# Patient Record
Sex: Female | Born: 1975
Health system: Southern US, Community
[De-identification: ages and names within clinical notes are randomized; demographics above are authoritative.]

## PROBLEM LIST (undated history)

## (undated) DIAGNOSIS — R519 Headache, unspecified: Secondary | ICD-10-CM

## (undated) DIAGNOSIS — E119 Type 2 diabetes mellitus without complications: Secondary | ICD-10-CM

## (undated) DIAGNOSIS — Z9221 Personal history of antineoplastic chemotherapy: Secondary | ICD-10-CM

## (undated) DIAGNOSIS — J189 Pneumonia, unspecified organism: Secondary | ICD-10-CM

## (undated) DIAGNOSIS — I890 Lymphedema, not elsewhere classified: Secondary | ICD-10-CM

## (undated) DIAGNOSIS — F419 Anxiety disorder, unspecified: Secondary | ICD-10-CM

## (undated) DIAGNOSIS — Z923 Personal history of irradiation: Secondary | ICD-10-CM

## (undated) DIAGNOSIS — I10 Essential (primary) hypertension: Secondary | ICD-10-CM

## (undated) DIAGNOSIS — A048 Other specified bacterial intestinal infections: Secondary | ICD-10-CM

## (undated) DIAGNOSIS — C801 Malignant (primary) neoplasm, unspecified: Secondary | ICD-10-CM

## (undated) DIAGNOSIS — J302 Other seasonal allergic rhinitis: Secondary | ICD-10-CM

## (undated) DIAGNOSIS — Z853 Personal history of malignant neoplasm of breast: Secondary | ICD-10-CM

## (undated) DIAGNOSIS — M1990 Unspecified osteoarthritis, unspecified site: Secondary | ICD-10-CM

## (undated) DIAGNOSIS — F329 Major depressive disorder, single episode, unspecified: Secondary | ICD-10-CM

## (undated) DIAGNOSIS — R51 Headache: Secondary | ICD-10-CM

## (undated) DIAGNOSIS — F32A Depression, unspecified: Secondary | ICD-10-CM

## (undated) HISTORY — DX: Malignant (primary) neoplasm, unspecified: C80.1

## (undated) HISTORY — DX: Pneumonia, unspecified organism: J18.9

## (undated) HISTORY — DX: Unspecified osteoarthritis, unspecified site: M19.90

## (undated) HISTORY — DX: Essential (primary) hypertension: I10

## (undated) HISTORY — DX: Other specified bacterial intestinal infections: A04.8

## (undated) HISTORY — DX: Other seasonal allergic rhinitis: J30.2

## (undated) HISTORY — PX: NASAL SEPTUM SURGERY: SHX37

## (undated) HISTORY — DX: Type 2 diabetes mellitus without complications: E11.9

---

## 1997-09-10 ENCOUNTER — Emergency Department (HOSPITAL_COMMUNITY): Admission: EM | Admit: 1997-09-10 | Discharge: 1997-09-10 | Payer: Self-pay | Admitting: Emergency Medicine

## 2001-03-07 ENCOUNTER — Encounter: Admission: RE | Admit: 2001-03-07 | Discharge: 2001-03-07 | Payer: Self-pay | Admitting: Sports Medicine

## 2001-04-04 ENCOUNTER — Encounter: Admission: RE | Admit: 2001-04-04 | Discharge: 2001-04-04 | Payer: Self-pay | Admitting: Sports Medicine

## 2001-04-25 ENCOUNTER — Encounter: Payer: Self-pay | Admitting: Sports Medicine

## 2001-04-25 ENCOUNTER — Ambulatory Visit (HOSPITAL_COMMUNITY): Admission: RE | Admit: 2001-04-25 | Discharge: 2001-04-25 | Payer: Self-pay | Admitting: Obstetrics & Gynecology

## 2001-05-02 ENCOUNTER — Encounter: Admission: RE | Admit: 2001-05-02 | Discharge: 2001-05-02 | Payer: Self-pay | Admitting: Family Medicine

## 2001-05-22 ENCOUNTER — Encounter: Admission: RE | Admit: 2001-05-22 | Discharge: 2001-05-22 | Payer: Self-pay | Admitting: Family Medicine

## 2001-06-23 ENCOUNTER — Encounter: Admission: RE | Admit: 2001-06-23 | Discharge: 2001-06-23 | Payer: Self-pay | Admitting: Family Medicine

## 2001-06-26 ENCOUNTER — Encounter: Admission: RE | Admit: 2001-06-26 | Discharge: 2001-06-26 | Payer: Self-pay | Admitting: Family Medicine

## 2001-07-06 ENCOUNTER — Encounter (HOSPITAL_COMMUNITY): Admission: AD | Admit: 2001-07-06 | Discharge: 2001-07-16 | Payer: Self-pay | Admitting: *Deleted

## 2001-07-06 ENCOUNTER — Encounter: Admission: RE | Admit: 2001-07-06 | Discharge: 2001-07-06 | Payer: Self-pay | Admitting: Family Medicine

## 2001-07-12 ENCOUNTER — Encounter (INDEPENDENT_AMBULATORY_CARE_PROVIDER_SITE_OTHER): Payer: Self-pay | Admitting: Specialist

## 2001-07-13 ENCOUNTER — Encounter: Payer: Self-pay | Admitting: *Deleted

## 2001-07-13 ENCOUNTER — Inpatient Hospital Stay (HOSPITAL_COMMUNITY): Admission: AD | Admit: 2001-07-13 | Discharge: 2001-07-18 | Payer: Self-pay | Admitting: *Deleted

## 2001-07-13 DIAGNOSIS — O1490 Unspecified pre-eclampsia, unspecified trimester: Secondary | ICD-10-CM

## 2001-07-21 ENCOUNTER — Encounter: Admission: RE | Admit: 2001-07-21 | Discharge: 2001-07-21 | Payer: Self-pay | Admitting: Family Medicine

## 2001-07-25 ENCOUNTER — Encounter: Admission: RE | Admit: 2001-07-25 | Discharge: 2001-07-25 | Payer: Self-pay | Admitting: Sports Medicine

## 2001-08-11 ENCOUNTER — Encounter: Admission: RE | Admit: 2001-08-11 | Discharge: 2001-08-11 | Payer: Self-pay | Admitting: Family Medicine

## 2002-04-01 ENCOUNTER — Encounter: Payer: Self-pay | Admitting: Emergency Medicine

## 2002-04-01 ENCOUNTER — Emergency Department (HOSPITAL_COMMUNITY): Admission: EM | Admit: 2002-04-01 | Discharge: 2002-04-01 | Payer: Self-pay | Admitting: Emergency Medicine

## 2002-08-31 ENCOUNTER — Other Ambulatory Visit: Admission: RE | Admit: 2002-08-31 | Discharge: 2002-08-31 | Payer: Self-pay | Admitting: Family Medicine

## 2002-08-31 ENCOUNTER — Encounter: Admission: RE | Admit: 2002-08-31 | Discharge: 2002-08-31 | Payer: Self-pay | Admitting: Family Medicine

## 2002-10-05 ENCOUNTER — Encounter: Payer: Self-pay | Admitting: Emergency Medicine

## 2002-10-05 ENCOUNTER — Emergency Department (HOSPITAL_COMMUNITY): Admission: EM | Admit: 2002-10-05 | Discharge: 2002-10-05 | Payer: Self-pay | Admitting: Emergency Medicine

## 2002-11-05 ENCOUNTER — Encounter: Admission: RE | Admit: 2002-11-05 | Discharge: 2002-11-05 | Payer: Self-pay | Admitting: Sports Medicine

## 2002-11-12 ENCOUNTER — Encounter: Admission: RE | Admit: 2002-11-12 | Discharge: 2002-11-12 | Payer: Self-pay | Admitting: Sports Medicine

## 2002-11-15 ENCOUNTER — Encounter: Admission: RE | Admit: 2002-11-15 | Discharge: 2002-11-15 | Payer: Self-pay | Admitting: Family Medicine

## 2002-11-19 ENCOUNTER — Encounter: Admission: RE | Admit: 2002-11-19 | Discharge: 2002-11-19 | Payer: Self-pay | Admitting: Family Medicine

## 2002-11-28 ENCOUNTER — Encounter: Admission: RE | Admit: 2002-11-28 | Discharge: 2002-11-28 | Payer: Self-pay | Admitting: Family Medicine

## 2003-02-13 ENCOUNTER — Encounter: Admission: RE | Admit: 2003-02-13 | Discharge: 2003-02-13 | Payer: Self-pay | Admitting: Family Medicine

## 2003-02-26 ENCOUNTER — Encounter: Admission: RE | Admit: 2003-02-26 | Discharge: 2003-02-26 | Payer: Self-pay | Admitting: Family Medicine

## 2003-08-14 ENCOUNTER — Encounter: Admission: RE | Admit: 2003-08-14 | Discharge: 2003-08-14 | Payer: Self-pay | Admitting: Family Medicine

## 2003-08-21 ENCOUNTER — Encounter: Admission: RE | Admit: 2003-08-21 | Discharge: 2003-08-21 | Payer: Self-pay | Admitting: Family Medicine

## 2003-08-21 ENCOUNTER — Other Ambulatory Visit: Admission: RE | Admit: 2003-08-21 | Discharge: 2003-08-21 | Payer: Self-pay | Admitting: Family Medicine

## 2003-08-28 ENCOUNTER — Ambulatory Visit (HOSPITAL_COMMUNITY): Admission: RE | Admit: 2003-08-28 | Discharge: 2003-08-28 | Payer: Self-pay | Admitting: Family Medicine

## 2003-09-03 ENCOUNTER — Encounter: Admission: RE | Admit: 2003-09-03 | Discharge: 2003-09-03 | Payer: Self-pay | Admitting: Family Medicine

## 2003-09-24 ENCOUNTER — Encounter: Admission: RE | Admit: 2003-09-24 | Discharge: 2003-09-24 | Payer: Self-pay | Admitting: Family Medicine

## 2003-10-03 ENCOUNTER — Inpatient Hospital Stay (HOSPITAL_COMMUNITY): Admission: AD | Admit: 2003-10-03 | Discharge: 2003-10-03 | Payer: Self-pay | Admitting: Obstetrics & Gynecology

## 2003-10-29 ENCOUNTER — Encounter: Admission: RE | Admit: 2003-10-29 | Discharge: 2003-10-29 | Payer: Self-pay | Admitting: Family Medicine

## 2003-10-31 ENCOUNTER — Ambulatory Visit (HOSPITAL_COMMUNITY): Admission: RE | Admit: 2003-10-31 | Discharge: 2003-10-31 | Payer: Self-pay | Admitting: Family Medicine

## 2003-11-28 ENCOUNTER — Encounter: Admission: RE | Admit: 2003-11-28 | Discharge: 2003-11-28 | Payer: Self-pay | Admitting: Sports Medicine

## 2004-01-01 ENCOUNTER — Encounter: Admission: RE | Admit: 2004-01-01 | Discharge: 2004-01-01 | Payer: Self-pay | Admitting: Family Medicine

## 2004-03-18 DIAGNOSIS — O24419 Gestational diabetes mellitus in pregnancy, unspecified control: Secondary | ICD-10-CM

## 2004-06-07 HISTORY — PX: SUPRA-UMBILICAL HERNIA: SHX6105

## 2004-08-05 ENCOUNTER — Encounter (INDEPENDENT_AMBULATORY_CARE_PROVIDER_SITE_OTHER): Payer: Self-pay | Admitting: *Deleted

## 2004-08-31 ENCOUNTER — Ambulatory Visit: Payer: Self-pay | Admitting: Sports Medicine

## 2004-10-30 ENCOUNTER — Emergency Department (HOSPITAL_COMMUNITY): Admission: EM | Admit: 2004-10-30 | Discharge: 2004-10-30 | Payer: Self-pay | Admitting: Emergency Medicine

## 2004-12-25 ENCOUNTER — Ambulatory Visit: Payer: Self-pay | Admitting: Family Medicine

## 2005-03-06 ENCOUNTER — Emergency Department (HOSPITAL_COMMUNITY): Admission: EM | Admit: 2005-03-06 | Discharge: 2005-03-06 | Payer: Self-pay | Admitting: Emergency Medicine

## 2005-09-21 ENCOUNTER — Ambulatory Visit: Payer: Self-pay | Admitting: Sports Medicine

## 2005-09-24 ENCOUNTER — Emergency Department (HOSPITAL_COMMUNITY): Admission: EM | Admit: 2005-09-24 | Discharge: 2005-09-24 | Payer: Self-pay | Admitting: Family Medicine

## 2005-10-01 ENCOUNTER — Ambulatory Visit (HOSPITAL_COMMUNITY): Admission: RE | Admit: 2005-10-01 | Discharge: 2005-10-01 | Payer: Self-pay | Admitting: *Deleted

## 2005-11-21 ENCOUNTER — Emergency Department (HOSPITAL_COMMUNITY): Admission: EM | Admit: 2005-11-21 | Discharge: 2005-11-21 | Payer: Self-pay | Admitting: Emergency Medicine

## 2006-01-30 ENCOUNTER — Emergency Department (HOSPITAL_COMMUNITY): Admission: EM | Admit: 2006-01-30 | Discharge: 2006-01-30 | Payer: Self-pay | Admitting: Emergency Medicine

## 2006-03-02 ENCOUNTER — Emergency Department (HOSPITAL_COMMUNITY): Admission: EM | Admit: 2006-03-02 | Discharge: 2006-03-02 | Payer: Self-pay | Admitting: Family Medicine

## 2006-08-04 DIAGNOSIS — F329 Major depressive disorder, single episode, unspecified: Secondary | ICD-10-CM

## 2006-08-04 DIAGNOSIS — E669 Obesity, unspecified: Secondary | ICD-10-CM

## 2006-08-04 DIAGNOSIS — J309 Allergic rhinitis, unspecified: Secondary | ICD-10-CM | POA: Insufficient documentation

## 2006-08-05 ENCOUNTER — Encounter (INDEPENDENT_AMBULATORY_CARE_PROVIDER_SITE_OTHER): Payer: Self-pay | Admitting: *Deleted

## 2007-06-12 ENCOUNTER — Encounter (INDEPENDENT_AMBULATORY_CARE_PROVIDER_SITE_OTHER): Payer: Self-pay | Admitting: *Deleted

## 2007-06-12 ENCOUNTER — Ambulatory Visit: Payer: Self-pay | Admitting: Family Medicine

## 2007-06-12 LAB — CONVERTED CEMR LAB
Beta hcg, urine, semiquantitative: NEGATIVE
GC Probe Amp, Genital: NEGATIVE
HCT: 42 % (ref 36.0–46.0)
Pap Smear: NORMAL
Platelets: 265 10*3/uL (ref 150–400)
RDW: 14 % (ref 11.5–15.5)
TSH: 0.922 microintl units/mL (ref 0.350–5.50)
Whiff Test: POSITIVE

## 2007-06-15 ENCOUNTER — Encounter (INDEPENDENT_AMBULATORY_CARE_PROVIDER_SITE_OTHER): Payer: Self-pay | Admitting: *Deleted

## 2007-09-13 ENCOUNTER — Encounter (INDEPENDENT_AMBULATORY_CARE_PROVIDER_SITE_OTHER): Payer: Self-pay | Admitting: *Deleted

## 2008-03-20 ENCOUNTER — Encounter: Payer: Self-pay | Admitting: Family Medicine

## 2008-03-20 ENCOUNTER — Ambulatory Visit: Payer: Self-pay | Admitting: Family Medicine

## 2008-03-20 DIAGNOSIS — O99810 Abnormal glucose complicating pregnancy: Secondary | ICD-10-CM | POA: Insufficient documentation

## 2008-03-20 LAB — CONVERTED CEMR LAB
Basophils Absolute: 0 10*3/uL (ref 0.0–0.1)
Basophils Relative: 0 % (ref 0–1)
Hemoglobin: 13.2 g/dL (ref 12.0–15.0)
Hepatitis B Surface Ag: NEGATIVE
Lymphocytes Relative: 30 % (ref 12–46)
MCHC: 32.8 g/dL (ref 30.0–36.0)
Monocytes Absolute: 0.4 10*3/uL (ref 0.1–1.0)
Neutro Abs: 5.1 10*3/uL (ref 1.7–7.7)
Neutrophils Relative %: 58 % (ref 43–77)
RDW: 13.5 % (ref 11.5–15.5)
Rubella: 11.3 intl units/mL — ABNORMAL HIGH

## 2008-04-10 ENCOUNTER — Ambulatory Visit: Payer: Self-pay | Admitting: Family Medicine

## 2008-04-10 ENCOUNTER — Encounter: Payer: Self-pay | Admitting: Family Medicine

## 2008-04-10 LAB — CONVERTED CEMR LAB
Glucose, Urine, Semiquant: NEGATIVE
Protein, U semiquant: NEGATIVE

## 2008-04-12 ENCOUNTER — Telehealth: Payer: Self-pay | Admitting: *Deleted

## 2008-04-18 ENCOUNTER — Telehealth: Payer: Self-pay | Admitting: *Deleted

## 2008-04-30 ENCOUNTER — Telehealth: Payer: Self-pay | Admitting: *Deleted

## 2008-05-07 ENCOUNTER — Ambulatory Visit: Payer: Self-pay | Admitting: Family Medicine

## 2008-05-20 ENCOUNTER — Ambulatory Visit: Payer: Self-pay | Admitting: Family Medicine

## 2008-05-20 ENCOUNTER — Encounter: Payer: Self-pay | Admitting: Family Medicine

## 2008-05-20 DIAGNOSIS — IMO0002 Reserved for concepts with insufficient information to code with codable children: Secondary | ICD-10-CM | POA: Insufficient documentation

## 2008-06-20 ENCOUNTER — Ambulatory Visit: Payer: Self-pay | Admitting: Family Medicine

## 2008-06-20 LAB — CONVERTED CEMR LAB: Whiff Test: POSITIVE

## 2008-06-24 ENCOUNTER — Telehealth: Payer: Self-pay | Admitting: Family Medicine

## 2008-06-24 ENCOUNTER — Ambulatory Visit (HOSPITAL_COMMUNITY): Admission: RE | Admit: 2008-06-24 | Discharge: 2008-06-24 | Payer: Self-pay | Admitting: Family Medicine

## 2008-06-24 ENCOUNTER — Encounter: Payer: Self-pay | Admitting: Family Medicine

## 2008-07-04 ENCOUNTER — Encounter: Payer: Self-pay | Admitting: *Deleted

## 2008-07-11 ENCOUNTER — Encounter: Payer: Self-pay | Admitting: Family Medicine

## 2008-07-23 ENCOUNTER — Ambulatory Visit: Payer: Self-pay | Admitting: Family Medicine

## 2008-08-19 ENCOUNTER — Ambulatory Visit: Payer: Self-pay | Admitting: Family Medicine

## 2008-08-19 DIAGNOSIS — O34219 Maternal care for unspecified type scar from previous cesarean delivery: Secondary | ICD-10-CM | POA: Insufficient documentation

## 2008-08-19 LAB — CONVERTED CEMR LAB: Glucose, Urine, Semiquant: NEGATIVE

## 2008-08-22 ENCOUNTER — Encounter: Admission: RE | Admit: 2008-08-22 | Discharge: 2008-08-22 | Payer: Self-pay | Admitting: Family Medicine

## 2008-08-22 ENCOUNTER — Encounter: Payer: Self-pay | Admitting: Family Medicine

## 2008-08-27 ENCOUNTER — Ambulatory Visit: Payer: Self-pay | Admitting: Family Medicine

## 2008-08-27 ENCOUNTER — Encounter: Payer: Self-pay | Admitting: Family Medicine

## 2008-09-19 ENCOUNTER — Encounter: Payer: Self-pay | Admitting: Family Medicine

## 2008-09-19 ENCOUNTER — Encounter: Payer: Self-pay | Admitting: *Deleted

## 2008-09-19 ENCOUNTER — Ambulatory Visit: Payer: Self-pay | Admitting: Family Medicine

## 2008-09-19 LAB — CONVERTED CEMR LAB

## 2008-09-23 ENCOUNTER — Telehealth: Payer: Self-pay | Admitting: Family Medicine

## 2008-09-25 ENCOUNTER — Ambulatory Visit: Payer: Self-pay | Admitting: Family Medicine

## 2008-09-25 LAB — CONVERTED CEMR LAB
Bilirubin Urine: NEGATIVE
Glucose, Urine, Semiquant: 100
Specific Gravity, Urine: 1.02
WBC Urine, dipstick: NEGATIVE

## 2008-10-10 ENCOUNTER — Ambulatory Visit: Payer: Self-pay | Admitting: Family Medicine

## 2008-10-14 ENCOUNTER — Encounter: Payer: Self-pay | Admitting: *Deleted

## 2008-10-14 ENCOUNTER — Telehealth: Payer: Self-pay | Admitting: Family Medicine

## 2008-10-24 ENCOUNTER — Ambulatory Visit: Payer: Self-pay | Admitting: Family Medicine

## 2008-10-24 ENCOUNTER — Inpatient Hospital Stay (HOSPITAL_COMMUNITY): Admission: AD | Admit: 2008-10-24 | Discharge: 2008-10-24 | Payer: Self-pay | Admitting: Obstetrics & Gynecology

## 2008-10-25 ENCOUNTER — Ambulatory Visit: Payer: Self-pay | Admitting: Family Medicine

## 2008-10-28 ENCOUNTER — Ambulatory Visit: Payer: Self-pay | Admitting: Family Medicine

## 2008-10-28 ENCOUNTER — Telehealth: Payer: Self-pay | Admitting: *Deleted

## 2008-10-29 ENCOUNTER — Encounter: Payer: Self-pay | Admitting: Obstetrics & Gynecology

## 2008-10-29 LAB — CONVERTED CEMR LAB
Creatinine 24 HR UR: 1810 mg/24hr — ABNORMAL HIGH (ref 700–1800)
Creatinine, Urine: 114.9 mg/dL
Protein, Ur: 110 mg/24hr — ABNORMAL HIGH (ref 50–100)

## 2008-10-30 ENCOUNTER — Encounter: Payer: Self-pay | Admitting: Family Medicine

## 2008-10-30 ENCOUNTER — Ambulatory Visit: Payer: Self-pay | Admitting: Family Medicine

## 2008-10-31 ENCOUNTER — Encounter: Payer: Self-pay | Admitting: *Deleted

## 2008-11-02 ENCOUNTER — Encounter: Payer: Self-pay | Admitting: Family Medicine

## 2008-11-05 ENCOUNTER — Ambulatory Visit: Payer: Self-pay | Admitting: Family Medicine

## 2008-11-05 DIAGNOSIS — O3660X0 Maternal care for excessive fetal growth, unspecified trimester, not applicable or unspecified: Secondary | ICD-10-CM | POA: Insufficient documentation

## 2008-11-07 ENCOUNTER — Ambulatory Visit: Payer: Self-pay | Admitting: Obstetrics & Gynecology

## 2008-11-08 ENCOUNTER — Encounter: Payer: Self-pay | Admitting: Family Medicine

## 2008-11-12 ENCOUNTER — Ambulatory Visit: Payer: Self-pay | Admitting: Family Medicine

## 2008-11-14 ENCOUNTER — Ambulatory Visit: Payer: Self-pay | Admitting: Obstetrics & Gynecology

## 2008-11-15 ENCOUNTER — Encounter: Payer: Self-pay | Admitting: Family Medicine

## 2008-11-21 ENCOUNTER — Inpatient Hospital Stay (HOSPITAL_COMMUNITY): Admission: RE | Admit: 2008-11-21 | Discharge: 2008-11-24 | Payer: Self-pay | Admitting: Obstetrics & Gynecology

## 2008-11-21 ENCOUNTER — Ambulatory Visit: Payer: Self-pay | Admitting: Obstetrics & Gynecology

## 2008-11-21 HISTORY — PX: TUBAL LIGATION: SHX77

## 2008-11-27 ENCOUNTER — Ambulatory Visit: Payer: Self-pay | Admitting: Family Medicine

## 2009-01-07 ENCOUNTER — Encounter: Payer: Self-pay | Admitting: Family Medicine

## 2009-01-07 ENCOUNTER — Other Ambulatory Visit: Admission: RE | Admit: 2009-01-07 | Discharge: 2009-01-07 | Payer: Self-pay | Admitting: Family Medicine

## 2009-01-07 ENCOUNTER — Ambulatory Visit: Payer: Self-pay | Admitting: Family Medicine

## 2009-01-10 ENCOUNTER — Encounter: Payer: Self-pay | Admitting: Family Medicine

## 2009-06-07 DIAGNOSIS — Z923 Personal history of irradiation: Secondary | ICD-10-CM

## 2009-06-07 DIAGNOSIS — C801 Malignant (primary) neoplasm, unspecified: Secondary | ICD-10-CM

## 2009-06-07 HISTORY — DX: Malignant (primary) neoplasm, unspecified: C80.1

## 2009-06-07 HISTORY — DX: Personal history of irradiation: Z92.3

## 2009-06-07 HISTORY — PX: MASTECTOMY: SHX3

## 2009-07-08 ENCOUNTER — Ambulatory Visit: Payer: Self-pay | Admitting: Family Medicine

## 2009-08-26 ENCOUNTER — Encounter: Admission: RE | Admit: 2009-08-26 | Discharge: 2009-08-26 | Payer: Self-pay | Admitting: Family Medicine

## 2009-08-26 HISTORY — PX: BREAST BIOPSY: SHX20

## 2009-08-27 ENCOUNTER — Ambulatory Visit: Payer: Self-pay | Admitting: Oncology

## 2009-08-29 ENCOUNTER — Encounter: Payer: Self-pay | Admitting: Family Medicine

## 2009-08-29 ENCOUNTER — Encounter: Payer: Self-pay | Admitting: *Deleted

## 2009-08-29 ENCOUNTER — Telehealth: Payer: Self-pay | Admitting: Family Medicine

## 2009-09-02 ENCOUNTER — Encounter: Admission: RE | Admit: 2009-09-02 | Discharge: 2009-09-02 | Payer: Self-pay | Admitting: Family Medicine

## 2009-09-03 ENCOUNTER — Encounter: Payer: Self-pay | Admitting: Family Medicine

## 2009-09-03 LAB — CBC WITH DIFFERENTIAL/PLATELET
Eosinophils Absolute: 0.4 10*3/uL (ref 0.0–0.5)
MCV: 92.6 fL (ref 79.5–101.0)
MONO%: 7.1 % (ref 0.0–14.0)
NEUT#: 3.9 10*3/uL (ref 1.5–6.5)
RBC: 4.34 10*6/uL (ref 3.70–5.45)
RDW: 14.4 % (ref 11.2–14.5)
WBC: 8.1 10*3/uL (ref 3.9–10.3)
lymph#: 3.3 10*3/uL (ref 0.9–3.3)

## 2009-09-03 LAB — COMPREHENSIVE METABOLIC PANEL
ALT: 13 U/L (ref 0–35)
Albumin: 4.1 g/dL (ref 3.5–5.2)
Alkaline Phosphatase: 56 U/L (ref 39–117)
CO2: 22 mEq/L (ref 19–32)
Potassium: 3.9 mEq/L (ref 3.5–5.3)
Sodium: 139 mEq/L (ref 135–145)
Total Bilirubin: 0.2 mg/dL — ABNORMAL LOW (ref 0.3–1.2)
Total Protein: 7.5 g/dL (ref 6.0–8.3)

## 2009-09-03 LAB — CANCER ANTIGEN 27.29: CA 27.29: 29 U/mL (ref 0–39)

## 2009-09-09 ENCOUNTER — Encounter: Payer: Self-pay | Admitting: Oncology

## 2009-09-09 ENCOUNTER — Ambulatory Visit: Admission: RE | Admit: 2009-09-09 | Discharge: 2009-09-09 | Payer: Self-pay | Admitting: Oncology

## 2009-09-10 ENCOUNTER — Ambulatory Visit (HOSPITAL_COMMUNITY): Admission: RE | Admit: 2009-09-10 | Discharge: 2009-09-10 | Payer: Self-pay | Admitting: Oncology

## 2009-09-12 ENCOUNTER — Ambulatory Visit (HOSPITAL_BASED_OUTPATIENT_CLINIC_OR_DEPARTMENT_OTHER): Admission: RE | Admit: 2009-09-12 | Discharge: 2009-09-12 | Payer: Self-pay | Admitting: General Surgery

## 2009-09-12 HISTORY — PX: PORTACATH PLACEMENT: SHX2246

## 2009-09-15 ENCOUNTER — Encounter: Payer: Self-pay | Admitting: Family Medicine

## 2009-09-16 DIAGNOSIS — Z9221 Personal history of antineoplastic chemotherapy: Secondary | ICD-10-CM

## 2009-09-16 HISTORY — DX: Personal history of antineoplastic chemotherapy: Z92.21

## 2009-09-17 ENCOUNTER — Encounter: Payer: Self-pay | Admitting: Family Medicine

## 2009-09-25 ENCOUNTER — Encounter: Payer: Self-pay | Admitting: Family Medicine

## 2009-09-25 LAB — CBC WITH DIFFERENTIAL/PLATELET
Basophils Absolute: 0 10*3/uL (ref 0.0–0.1)
Eosinophils Absolute: 0.2 10*3/uL (ref 0.0–0.5)
HGB: 12.7 g/dL (ref 11.6–15.9)
MCV: 91.7 fL (ref 79.5–101.0)
MONO#: 0.1 10*3/uL (ref 0.1–0.9)
MONO%: 1.5 % (ref 0.0–14.0)
NEUT#: 3.4 10*3/uL (ref 1.5–6.5)
RBC: 4.05 10*6/uL (ref 3.70–5.45)
RDW: 14.2 % (ref 11.2–14.5)
WBC: 5.1 10*3/uL (ref 3.9–10.3)
lymph#: 1.3 10*3/uL (ref 0.9–3.3)

## 2009-10-01 ENCOUNTER — Ambulatory Visit: Payer: Self-pay | Admitting: Oncology

## 2009-10-02 ENCOUNTER — Encounter: Payer: Self-pay | Admitting: Family Medicine

## 2009-10-02 LAB — CBC WITH DIFFERENTIAL/PLATELET
BASO%: 0.3 % (ref 0.0–2.0)
Basophils Absolute: 0 10*3/uL (ref 0.0–0.1)
EOS%: 0.1 % (ref 0.0–7.0)
MCH: 30.2 pg (ref 25.1–34.0)
MCHC: 33.1 g/dL (ref 31.5–36.0)
MCV: 91.4 fL (ref 79.5–101.0)
MONO%: 6.1 % (ref 0.0–14.0)
RBC: 4.07 10*6/uL (ref 3.70–5.45)
RDW: 14.5 % (ref 11.2–14.5)
lymph#: 1.8 10*3/uL (ref 0.9–3.3)

## 2009-10-09 ENCOUNTER — Encounter: Payer: Self-pay | Admitting: Family Medicine

## 2009-10-09 LAB — CBC WITH DIFFERENTIAL/PLATELET
Eosinophils Absolute: 0 10*3/uL (ref 0.0–0.5)
HCT: 37 % (ref 34.8–46.6)
HGB: 12.5 g/dL (ref 11.6–15.9)
LYMPH%: 14.8 % (ref 14.0–49.7)
MONO#: 0.4 10*3/uL (ref 0.1–0.9)
NEUT#: 6.9 10*3/uL — ABNORMAL HIGH (ref 1.5–6.5)
NEUT%: 80.4 % — ABNORMAL HIGH (ref 38.4–76.8)
Platelets: 245 10*3/uL (ref 145–400)
WBC: 8.6 10*3/uL (ref 3.9–10.3)

## 2009-10-16 ENCOUNTER — Encounter: Payer: Self-pay | Admitting: Family Medicine

## 2009-10-16 LAB — CBC WITH DIFFERENTIAL/PLATELET
BASO%: 0.4 % (ref 0.0–2.0)
HCT: 36.8 % (ref 34.8–46.6)
LYMPH%: 19.8 % (ref 14.0–49.7)
MCHC: 33.4 g/dL (ref 31.5–36.0)
MONO#: 0.8 10*3/uL (ref 0.1–0.9)
NEUT%: 70.1 % (ref 38.4–76.8)
Platelets: 169 10*3/uL (ref 145–400)
WBC: 8.3 10*3/uL (ref 3.9–10.3)

## 2009-10-20 ENCOUNTER — Ambulatory Visit: Payer: Self-pay | Admitting: Oncology

## 2009-10-21 ENCOUNTER — Encounter: Payer: Self-pay | Admitting: Family Medicine

## 2009-10-21 ENCOUNTER — Ambulatory Visit: Payer: Self-pay | Admitting: Family Medicine

## 2009-10-21 LAB — CONVERTED CEMR LAB
HCT: 39.2 % (ref 36.0–46.0)
Lymphocytes Relative: 9 % — ABNORMAL LOW (ref 12–46)
Lymphs Abs: 0.9 10*3/uL (ref 0.7–4.0)
Monocytes Relative: 2 % — ABNORMAL LOW (ref 3–12)
Neutrophils Relative %: 87 % — ABNORMAL HIGH (ref 43–77)
Platelets: 219 10*3/uL (ref 150–400)
RBC: 4.38 M/uL (ref 3.87–5.11)
WBC: 9.4 10*3/uL (ref 4.0–10.5)

## 2009-10-22 ENCOUNTER — Encounter: Payer: Self-pay | Admitting: Family Medicine

## 2009-10-22 ENCOUNTER — Inpatient Hospital Stay (HOSPITAL_COMMUNITY): Admission: AD | Admit: 2009-10-22 | Discharge: 2009-10-23 | Payer: Self-pay | Admitting: Oncology

## 2009-10-22 ENCOUNTER — Ambulatory Visit (HOSPITAL_COMMUNITY): Admission: RE | Admit: 2009-10-22 | Discharge: 2009-10-22 | Payer: Self-pay | Admitting: Family Medicine

## 2009-10-22 ENCOUNTER — Encounter: Payer: Self-pay | Admitting: *Deleted

## 2009-10-22 LAB — CBC WITH DIFFERENTIAL/PLATELET
Basophils Absolute: 0 10*3/uL (ref 0.0–0.1)
EOS%: 1 % (ref 0.0–7.0)
HCT: 37.8 % (ref 34.8–46.6)
HGB: 13.1 g/dL (ref 11.6–15.9)
LYMPH%: 9 % — ABNORMAL LOW (ref 14.0–49.7)
MCH: 31.3 pg (ref 25.1–34.0)
MCV: 90.5 fL (ref 79.5–101.0)
MONO%: 1.1 % (ref 0.0–14.0)
NEUT%: 88.7 % — ABNORMAL HIGH (ref 38.4–76.8)
Platelets: 187 10*3/uL (ref 145–400)
RDW: 15.1 % — ABNORMAL HIGH (ref 11.2–14.5)

## 2009-10-22 LAB — COMPREHENSIVE METABOLIC PANEL
AST: 13 U/L (ref 0–37)
Alkaline Phosphatase: 92 U/L (ref 39–117)
BUN: 7 mg/dL (ref 6–23)
Creatinine, Ser: 0.58 mg/dL (ref 0.40–1.20)
Total Bilirubin: 1.3 mg/dL — ABNORMAL HIGH (ref 0.3–1.2)

## 2009-10-22 LAB — URINALYSIS, MICROSCOPIC - CHCC
Bilirubin (Urine): NEGATIVE
Blood: NEGATIVE
Glucose: NEGATIVE g/dL
Ketones: 40 mg/dL
pH: 7.5 (ref 4.6–8.0)

## 2009-10-23 LAB — URINE CULTURE

## 2009-10-24 ENCOUNTER — Emergency Department (HOSPITAL_COMMUNITY): Admission: EM | Admit: 2009-10-24 | Discharge: 2009-10-24 | Payer: Self-pay | Admitting: Emergency Medicine

## 2009-10-30 ENCOUNTER — Encounter: Payer: Self-pay | Admitting: Family Medicine

## 2009-10-30 LAB — CBC WITH DIFFERENTIAL/PLATELET
Basophils Absolute: 0 10*3/uL (ref 0.0–0.1)
EOS%: 0.2 % (ref 0.0–7.0)
HGB: 10.8 g/dL — ABNORMAL LOW (ref 11.6–15.9)
MCH: 30.3 pg (ref 25.1–34.0)
NEUT#: 6.1 10*3/uL (ref 1.5–6.5)
RBC: 3.56 10*6/uL — ABNORMAL LOW (ref 3.70–5.45)
RDW: 15.8 % — ABNORMAL HIGH (ref 11.2–14.5)
lymph#: 1.5 10*3/uL (ref 0.9–3.3)

## 2009-11-05 ENCOUNTER — Ambulatory Visit: Payer: Self-pay | Admitting: Oncology

## 2009-11-06 ENCOUNTER — Encounter: Payer: Self-pay | Admitting: Family Medicine

## 2009-11-06 LAB — COMPREHENSIVE METABOLIC PANEL
ALT: 21 U/L (ref 0–35)
AST: 18 U/L (ref 0–37)
Alkaline Phosphatase: 56 U/L (ref 39–117)
Creatinine, Ser: 0.54 mg/dL (ref 0.40–1.20)
Sodium: 140 mEq/L (ref 135–145)
Total Bilirubin: 0.5 mg/dL (ref 0.3–1.2)
Total Protein: 6.3 g/dL (ref 6.0–8.3)

## 2009-11-06 LAB — CBC WITH DIFFERENTIAL/PLATELET
BASO%: 1.5 % (ref 0.0–2.0)
EOS%: 1.7 % (ref 0.0–7.0)
Eosinophils Absolute: 0.1 10*3/uL (ref 0.0–0.5)
LYMPH%: 21.3 % (ref 14.0–49.7)
MCHC: 32.6 g/dL (ref 31.5–36.0)
MCV: 92.3 fL (ref 79.5–101.0)
MONO%: 15.5 % — ABNORMAL HIGH (ref 0.0–14.0)
NEUT#: 3.6 10*3/uL (ref 1.5–6.5)
RBC: 3.79 10*6/uL (ref 3.70–5.45)
RDW: 16.7 % — ABNORMAL HIGH (ref 11.2–14.5)
nRBC: 0 % (ref 0–0)

## 2009-11-13 ENCOUNTER — Encounter: Payer: Self-pay | Admitting: Family Medicine

## 2009-11-13 ENCOUNTER — Encounter: Payer: Self-pay | Admitting: Oncology

## 2009-11-13 ENCOUNTER — Ambulatory Visit (HOSPITAL_COMMUNITY): Admission: RE | Admit: 2009-11-13 | Discharge: 2009-11-13 | Payer: Self-pay | Admitting: Oncology

## 2009-11-13 LAB — CBC WITH DIFFERENTIAL/PLATELET
BASO%: 0.6 % (ref 0.0–2.0)
HCT: 32.3 % — ABNORMAL LOW (ref 34.8–46.6)
LYMPH%: 21 % (ref 14.0–49.7)
MCHC: 34.7 g/dL (ref 31.5–36.0)
MCV: 92.7 fL (ref 79.5–101.0)
MONO#: 0.1 10*3/uL (ref 0.1–0.9)
MONO%: 1.3 % (ref 0.0–14.0)
NEUT%: 74.9 % (ref 38.4–76.8)
Platelets: 191 10*3/uL (ref 145–400)
RBC: 3.48 10*6/uL — ABNORMAL LOW (ref 3.70–5.45)

## 2009-11-17 ENCOUNTER — Ambulatory Visit (HOSPITAL_COMMUNITY): Admission: RE | Admit: 2009-11-17 | Discharge: 2009-11-17 | Payer: Self-pay | Admitting: Oncology

## 2009-11-20 ENCOUNTER — Encounter: Payer: Self-pay | Admitting: Family Medicine

## 2009-11-20 LAB — CBC WITH DIFFERENTIAL/PLATELET
BASO%: 0.3 % (ref 0.0–2.0)
EOS%: 0.5 % (ref 0.0–7.0)
HCT: 37 % (ref 34.8–46.6)
LYMPH%: 20.1 % (ref 14.0–49.7)
MCH: 30.3 pg (ref 25.1–34.0)
MCHC: 32.7 g/dL (ref 31.5–36.0)
MONO#: 0.5 10*3/uL (ref 0.1–0.9)
NEUT%: 71 % (ref 38.4–76.8)
Platelets: 207 10*3/uL (ref 145–400)
RBC: 4 10*6/uL (ref 3.70–5.45)
WBC: 6.1 10*3/uL (ref 3.9–10.3)
lymph#: 1.2 10*3/uL (ref 0.9–3.3)

## 2009-11-20 LAB — URINALYSIS, MICROSCOPIC - CHCC
Glucose: NEGATIVE g/dL
Nitrite: NEGATIVE
Protein: NEGATIVE mg/dL
Specific Gravity, Urine: 1.01 (ref 1.003–1.035)

## 2009-11-27 ENCOUNTER — Encounter: Payer: Self-pay | Admitting: Family Medicine

## 2009-11-27 LAB — CBC WITH DIFFERENTIAL/PLATELET
BASO%: 0.3 % (ref 0.0–2.0)
Basophils Absolute: 0 10*3/uL (ref 0.0–0.1)
EOS%: 1.6 % (ref 0.0–7.0)
HCT: 32.6 % — ABNORMAL LOW (ref 34.8–46.6)
HGB: 10.7 g/dL — ABNORMAL LOW (ref 11.6–15.9)
LYMPH%: 20.2 % (ref 14.0–49.7)
MCH: 30.6 pg (ref 25.1–34.0)
MCHC: 32.8 g/dL (ref 31.5–36.0)
MCV: 93.1 fL (ref 79.5–101.0)
MONO%: 10.7 % (ref 0.0–14.0)
NEUT%: 67.2 % (ref 38.4–76.8)
Platelets: 271 10*3/uL (ref 145–400)

## 2009-11-27 LAB — COMPREHENSIVE METABOLIC PANEL
AST: 12 U/L (ref 0–37)
BUN: 10 mg/dL (ref 6–23)
Calcium: 8.9 mg/dL (ref 8.4–10.5)
Chloride: 110 mEq/L (ref 96–112)
Creatinine, Ser: 0.67 mg/dL (ref 0.40–1.20)
Total Bilirubin: 0.2 mg/dL — ABNORMAL LOW (ref 0.3–1.2)

## 2009-12-04 ENCOUNTER — Encounter: Payer: Self-pay | Admitting: Family Medicine

## 2009-12-04 LAB — CBC WITH DIFFERENTIAL/PLATELET
Basophils Absolute: 0 10*3/uL (ref 0.0–0.1)
Eosinophils Absolute: 0.1 10*3/uL (ref 0.0–0.5)
HGB: 11.1 g/dL — ABNORMAL LOW (ref 11.6–15.9)
NEUT#: 3.3 10*3/uL (ref 1.5–6.5)
RDW: 16.1 % — ABNORMAL HIGH (ref 11.2–14.5)
lymph#: 1.3 10*3/uL (ref 0.9–3.3)

## 2009-12-09 ENCOUNTER — Ambulatory Visit: Payer: Self-pay | Admitting: Oncology

## 2009-12-11 ENCOUNTER — Encounter: Payer: Self-pay | Admitting: Family Medicine

## 2009-12-11 LAB — CBC WITH DIFFERENTIAL/PLATELET
BASO%: 0.5 % (ref 0.0–2.0)
EOS%: 1.8 % (ref 0.0–7.0)
MCH: 31.3 pg (ref 25.1–34.0)
MCHC: 33.7 g/dL (ref 31.5–36.0)
MONO%: 7.3 % (ref 0.0–14.0)
RBC: 3.55 10*6/uL — ABNORMAL LOW (ref 3.70–5.45)
RDW: 16 % — ABNORMAL HIGH (ref 11.2–14.5)
lymph#: 1.3 10*3/uL (ref 0.9–3.3)

## 2009-12-18 ENCOUNTER — Encounter: Payer: Self-pay | Admitting: Family Medicine

## 2009-12-18 LAB — CBC WITH DIFFERENTIAL/PLATELET
Basophils Absolute: 0 10*3/uL (ref 0.0–0.1)
EOS%: 2.2 % (ref 0.0–7.0)
Eosinophils Absolute: 0.1 10*3/uL (ref 0.0–0.5)
HGB: 11.6 g/dL (ref 11.6–15.9)
NEUT#: 2.5 10*3/uL (ref 1.5–6.5)
RBC: 3.64 10*6/uL — ABNORMAL LOW (ref 3.70–5.45)
RDW: 15.6 % — ABNORMAL HIGH (ref 11.2–14.5)
lymph#: 1.1 10*3/uL (ref 0.9–3.3)
nRBC: 0 % (ref 0–0)

## 2009-12-25 ENCOUNTER — Encounter: Payer: Self-pay | Admitting: Family Medicine

## 2009-12-25 LAB — CBC WITH DIFFERENTIAL/PLATELET
BASO%: 0.5 % (ref 0.0–2.0)
EOS%: 3.2 % (ref 0.0–7.0)
MCH: 32.1 pg (ref 25.1–34.0)
MCHC: 34.1 g/dL (ref 31.5–36.0)
RBC: 3.55 10*6/uL — ABNORMAL LOW (ref 3.70–5.45)
RDW: 14.9 % — ABNORMAL HIGH (ref 11.2–14.5)
lymph#: 1.3 10*3/uL (ref 0.9–3.3)

## 2010-01-01 ENCOUNTER — Encounter: Payer: Self-pay | Admitting: Family Medicine

## 2010-01-01 LAB — CBC WITH DIFFERENTIAL/PLATELET
Basophils Absolute: 0 10*3/uL (ref 0.0–0.1)
EOS%: 3.5 % (ref 0.0–7.0)
Eosinophils Absolute: 0.2 10*3/uL (ref 0.0–0.5)
HGB: 11.4 g/dL — ABNORMAL LOW (ref 11.6–15.9)
NEUT#: 2.7 10*3/uL (ref 1.5–6.5)
RDW: 14.4 % (ref 11.2–14.5)
lymph#: 1.6 10*3/uL (ref 0.9–3.3)
nRBC: 0 % (ref 0–0)

## 2010-01-01 LAB — COMPREHENSIVE METABOLIC PANEL
ALT: 16 U/L (ref 0–35)
Albumin: 4.2 g/dL (ref 3.5–5.2)
Alkaline Phosphatase: 60 U/L (ref 39–117)
CO2: 19 mEq/L (ref 19–32)
Glucose, Bld: 86 mg/dL (ref 70–99)
Potassium: 4 mEq/L (ref 3.5–5.3)
Sodium: 139 mEq/L (ref 135–145)
Total Protein: 6.8 g/dL (ref 6.0–8.3)

## 2010-01-08 ENCOUNTER — Encounter: Payer: Self-pay | Admitting: Family Medicine

## 2010-01-08 ENCOUNTER — Ambulatory Visit: Payer: Self-pay | Admitting: Oncology

## 2010-01-08 LAB — CBC WITH DIFFERENTIAL/PLATELET
Eosinophils Absolute: 0.2 10*3/uL (ref 0.0–0.5)
MONO#: 0.3 10*3/uL (ref 0.1–0.9)
NEUT#: 3.1 10*3/uL (ref 1.5–6.5)
RBC: 3.54 10*6/uL — ABNORMAL LOW (ref 3.70–5.45)
RDW: 13.9 % (ref 11.2–14.5)
WBC: 5 10*3/uL (ref 3.9–10.3)

## 2010-01-15 ENCOUNTER — Encounter: Payer: Self-pay | Admitting: Family Medicine

## 2010-01-15 LAB — CBC WITH DIFFERENTIAL/PLATELET
Eosinophils Absolute: 0.2 10*3/uL (ref 0.0–0.5)
HCT: 34.1 % — ABNORMAL LOW (ref 34.8–46.6)
HGB: 11.5 g/dL — ABNORMAL LOW (ref 11.6–15.9)
LYMPH%: 16 % (ref 14.0–49.7)
MONO#: 0.3 10*3/uL (ref 0.1–0.9)
NEUT#: 5 10*3/uL (ref 1.5–6.5)
NEUT%: 77.1 % — ABNORMAL HIGH (ref 38.4–76.8)
Platelets: 293 10*3/uL (ref 145–400)
WBC: 6.4 10*3/uL (ref 3.9–10.3)

## 2010-01-22 ENCOUNTER — Encounter: Payer: Self-pay | Admitting: Family Medicine

## 2010-01-22 LAB — CBC WITH DIFFERENTIAL/PLATELET
BASO%: 0.2 % (ref 0.0–2.0)
HCT: 35 % (ref 34.8–46.6)
LYMPH%: 31.2 % (ref 14.0–49.7)
MCHC: 33.7 g/dL (ref 31.5–36.0)
MONO#: 0.4 10*3/uL (ref 0.1–0.9)
NEUT%: 57.6 % (ref 38.4–76.8)
Platelets: 309 10*3/uL (ref 145–400)
WBC: 4.3 10*3/uL (ref 3.9–10.3)

## 2010-01-29 ENCOUNTER — Encounter: Payer: Self-pay | Admitting: Family Medicine

## 2010-01-29 LAB — CBC WITH DIFFERENTIAL/PLATELET
BASO%: 0.2 % (ref 0.0–2.0)
EOS%: 2.2 % (ref 0.0–7.0)
HCT: 34.9 % (ref 34.8–46.6)
LYMPH%: 25.5 % (ref 14.0–49.7)
MCH: 31.6 pg (ref 25.1–34.0)
MCHC: 33.5 g/dL (ref 31.5–36.0)
MCV: 94.3 fL (ref 79.5–101.0)
MONO%: 8 % (ref 0.0–14.0)
NEUT%: 64.1 % (ref 38.4–76.8)
Platelets: 240 10*3/uL (ref 145–400)
lymph#: 1.4 10*3/uL (ref 0.9–3.3)

## 2010-02-05 ENCOUNTER — Encounter: Payer: Self-pay | Admitting: Family Medicine

## 2010-02-05 LAB — CBC WITH DIFFERENTIAL/PLATELET
BASO%: 0.2 % (ref 0.0–2.0)
HCT: 34.2 % — ABNORMAL LOW (ref 34.8–46.6)
MCHC: 33.3 g/dL (ref 31.5–36.0)
MONO#: 0.4 10*3/uL (ref 0.1–0.9)
NEUT%: 59.4 % (ref 38.4–76.8)
WBC: 5 10*3/uL (ref 3.9–10.3)
lymph#: 1.5 10*3/uL (ref 0.9–3.3)
nRBC: 0 % (ref 0–0)

## 2010-02-05 LAB — COMPREHENSIVE METABOLIC PANEL
ALT: 28 U/L (ref 0–35)
AST: 24 U/L (ref 0–37)
Albumin: 4 g/dL (ref 3.5–5.2)
BUN: 8 mg/dL (ref 6–23)
Calcium: 8.9 mg/dL (ref 8.4–10.5)
Chloride: 106 mEq/L (ref 96–112)
Potassium: 3.7 mEq/L (ref 3.5–5.3)
Sodium: 137 mEq/L (ref 135–145)
Total Protein: 6.9 g/dL (ref 6.0–8.3)

## 2010-02-06 ENCOUNTER — Ambulatory Visit: Payer: Self-pay | Admitting: Genetic Counselor

## 2010-02-10 ENCOUNTER — Encounter: Payer: Self-pay | Admitting: Oncology

## 2010-02-10 ENCOUNTER — Ambulatory Visit: Payer: Self-pay | Admitting: Oncology

## 2010-02-10 ENCOUNTER — Ambulatory Visit: Admission: RE | Admit: 2010-02-10 | Discharge: 2010-02-10 | Payer: Self-pay | Admitting: Oncology

## 2010-02-12 ENCOUNTER — Encounter: Payer: Self-pay | Admitting: Family Medicine

## 2010-02-12 LAB — CBC WITH DIFFERENTIAL/PLATELET
Basophils Absolute: 0 10*3/uL (ref 0.0–0.1)
Eosinophils Absolute: 0.1 10*3/uL (ref 0.0–0.5)
HGB: 11.3 g/dL — ABNORMAL LOW (ref 11.6–15.9)
LYMPH%: 38.6 % (ref 14.0–49.7)
MCV: 92.1 fL (ref 79.5–101.0)
MONO#: 0.3 10*3/uL (ref 0.1–0.9)
MONO%: 9.7 % (ref 0.0–14.0)
NEUT#: 1.2 10*3/uL — ABNORMAL LOW (ref 1.5–6.5)
Platelets: 251 10*3/uL (ref 145–400)
RBC: 3.67 10*6/uL — ABNORMAL LOW (ref 3.70–5.45)
WBC: 2.7 10*3/uL — ABNORMAL LOW (ref 3.9–10.3)
nRBC: 0 % (ref 0–0)

## 2010-02-19 ENCOUNTER — Encounter: Payer: Self-pay | Admitting: Family Medicine

## 2010-02-19 ENCOUNTER — Ambulatory Visit (HOSPITAL_COMMUNITY): Admission: RE | Admit: 2010-02-19 | Discharge: 2010-02-19 | Payer: Self-pay | Admitting: Oncology

## 2010-02-19 LAB — CBC WITH DIFFERENTIAL/PLATELET
Basophils Absolute: 0 10*3/uL (ref 0.0–0.1)
EOS%: 2.7 % (ref 0.0–7.0)
HGB: 11.2 g/dL — ABNORMAL LOW (ref 11.6–15.9)
LYMPH%: 31 % (ref 14.0–49.7)
MCH: 31 pg (ref 25.1–34.0)
MCV: 93.4 fL (ref 79.5–101.0)
MONO%: 5.2 % (ref 0.0–14.0)
NEUT%: 60.9 % (ref 38.4–76.8)
RDW: 14 % (ref 11.2–14.5)

## 2010-02-19 LAB — COMPREHENSIVE METABOLIC PANEL
ALT: 29 U/L (ref 0–35)
CO2: 25 mEq/L (ref 19–32)
Calcium: 9.2 mg/dL (ref 8.4–10.5)
Chloride: 108 mEq/L (ref 96–112)
Creatinine, Ser: 0.52 mg/dL (ref 0.40–1.20)
Glucose, Bld: 118 mg/dL — ABNORMAL HIGH (ref 70–99)
Sodium: 142 mEq/L (ref 135–145)
Total Protein: 6.4 g/dL (ref 6.0–8.3)

## 2010-02-26 ENCOUNTER — Encounter: Payer: Self-pay | Admitting: Family Medicine

## 2010-02-26 LAB — CBC WITH DIFFERENTIAL/PLATELET
EOS%: 2.3 % (ref 0.0–7.0)
MCH: 30.7 pg (ref 25.1–34.0)
MCHC: 33.3 g/dL (ref 31.5–36.0)
MCV: 92.1 fL (ref 79.5–101.0)
MONO%: 8.2 % (ref 0.0–14.0)
NEUT#: 3 10*3/uL (ref 1.5–6.5)
RBC: 3.68 10*6/uL — ABNORMAL LOW (ref 3.70–5.45)
RDW: 14.4 % (ref 11.2–14.5)

## 2010-03-05 ENCOUNTER — Encounter: Payer: Self-pay | Admitting: Family Medicine

## 2010-03-05 LAB — CBC WITH DIFFERENTIAL/PLATELET
BASO%: 0.1 % (ref 0.0–2.0)
Eosinophils Absolute: 0.1 10*3/uL (ref 0.0–0.5)
MCHC: 33.2 g/dL (ref 31.5–36.0)
MONO#: 0.5 10*3/uL (ref 0.1–0.9)
MONO%: 5.7 % (ref 0.0–14.0)
NEUT#: 6 10*3/uL (ref 1.5–6.5)
RBC: 3.67 10*6/uL — ABNORMAL LOW (ref 3.70–5.45)
RDW: 14 % (ref 11.2–14.5)
WBC: 8.1 10*3/uL (ref 3.9–10.3)
nRBC: 0 % (ref 0–0)

## 2010-03-05 LAB — URINALYSIS, MICROSCOPIC - CHCC
Glucose: NEGATIVE g/dL
RBC count: NEGATIVE (ref 0–2)
Specific Gravity, Urine: 1.015 (ref 1.003–1.035)
WBC, UA: NEGATIVE (ref 0–2)
pH: 7.5 (ref 4.6–8.0)

## 2010-03-05 LAB — COMPREHENSIVE METABOLIC PANEL
ALT: 16 U/L (ref 0–35)
AST: 17 U/L (ref 0–37)
CO2: 22 mEq/L (ref 19–32)
Chloride: 106 mEq/L (ref 96–112)
Sodium: 139 mEq/L (ref 135–145)
Total Bilirubin: 0.3 mg/dL (ref 0.3–1.2)
Total Protein: 6.9 g/dL (ref 6.0–8.3)

## 2010-03-11 ENCOUNTER — Encounter (INDEPENDENT_AMBULATORY_CARE_PROVIDER_SITE_OTHER): Payer: Self-pay | Admitting: Surgery

## 2010-03-11 ENCOUNTER — Inpatient Hospital Stay (HOSPITAL_COMMUNITY): Admission: RE | Admit: 2010-03-11 | Discharge: 2010-03-13 | Payer: Self-pay | Admitting: Surgery

## 2010-03-11 HISTORY — PX: MODIFIED RADICAL MASTECTOMY: SHX5703

## 2010-03-25 ENCOUNTER — Ambulatory Visit: Payer: Self-pay | Admitting: Oncology

## 2010-03-27 LAB — CBC WITH DIFFERENTIAL/PLATELET
Eosinophils Absolute: 0.7 10*3/uL — ABNORMAL HIGH (ref 0.0–0.5)
MONO#: 0.5 10*3/uL (ref 0.1–0.9)
MONO%: 7.3 % (ref 0.0–14.0)
NEUT#: 3.5 10*3/uL (ref 1.5–6.5)
RBC: 3.58 10*6/uL — ABNORMAL LOW (ref 3.70–5.45)
RDW: 15.5 % — ABNORMAL HIGH (ref 11.2–14.5)
WBC: 6.2 10*3/uL (ref 3.9–10.3)
nRBC: 0 % (ref 0–0)

## 2010-03-27 LAB — COMPREHENSIVE METABOLIC PANEL
Albumin: 3.7 g/dL (ref 3.5–5.2)
CO2: 24 mEq/L (ref 19–32)
Calcium: 8.9 mg/dL (ref 8.4–10.5)
Glucose, Bld: 93 mg/dL (ref 70–99)
Potassium: 4.1 mEq/L (ref 3.5–5.3)
Sodium: 138 mEq/L (ref 135–145)
Total Protein: 6.1 g/dL (ref 6.0–8.3)

## 2010-03-30 ENCOUNTER — Ambulatory Visit
Admission: RE | Admit: 2010-03-30 | Discharge: 2010-06-25 | Payer: Self-pay | Source: Home / Self Care | Attending: Radiation Oncology | Admitting: Radiation Oncology

## 2010-04-01 ENCOUNTER — Encounter
Admission: RE | Admit: 2010-04-01 | Discharge: 2010-05-06 | Payer: Self-pay | Source: Home / Self Care | Admitting: Radiation Oncology

## 2010-04-08 ENCOUNTER — Ambulatory Visit: Payer: Self-pay | Admitting: Family Medicine

## 2010-04-08 DIAGNOSIS — J328 Other chronic sinusitis: Secondary | ICD-10-CM | POA: Insufficient documentation

## 2010-04-14 ENCOUNTER — Encounter: Payer: Self-pay | Admitting: Emergency Medicine

## 2010-04-14 ENCOUNTER — Encounter: Payer: Self-pay | Admitting: Family Medicine

## 2010-04-14 DIAGNOSIS — J020 Streptococcal pharyngitis: Secondary | ICD-10-CM | POA: Insufficient documentation

## 2010-04-15 ENCOUNTER — Observation Stay (HOSPITAL_COMMUNITY): Admission: EM | Admit: 2010-04-15 | Discharge: 2010-04-15 | Payer: Self-pay | Admitting: Family Medicine

## 2010-04-17 LAB — CBC WITH DIFFERENTIAL/PLATELET
Basophils Absolute: 0 10*3/uL (ref 0.0–0.1)
EOS%: 4 % (ref 0.0–7.0)
HGB: 10.7 g/dL — ABNORMAL LOW (ref 11.6–15.9)
LYMPH%: 29.4 % (ref 14.0–49.7)
MCH: 28.7 pg (ref 25.1–34.0)
MCV: 88.7 fL (ref 79.5–101.0)
MONO%: 9.7 % (ref 0.0–14.0)
NEUT%: 56.6 % (ref 38.4–76.8)
RDW: 14.5 % (ref 11.2–14.5)

## 2010-05-06 ENCOUNTER — Ambulatory Visit: Payer: Self-pay | Admitting: Oncology

## 2010-05-08 LAB — CBC WITH DIFFERENTIAL/PLATELET
BASO%: 0.5 % (ref 0.0–2.0)
EOS%: 15.2 % — ABNORMAL HIGH (ref 0.0–7.0)
LYMPH%: 26.2 % (ref 14.0–49.7)
MCHC: 32.7 g/dL (ref 31.5–36.0)
MCV: 88.1 fL (ref 79.5–101.0)
MONO%: 5.9 % (ref 0.0–14.0)
Platelets: 268 10*3/uL (ref 145–400)
RBC: 4.37 10*6/uL (ref 3.70–5.45)
WBC: 5.6 10*3/uL (ref 3.9–10.3)
nRBC: 0 % (ref 0–0)

## 2010-05-09 LAB — COMPREHENSIVE METABOLIC PANEL
ALT: 14 U/L (ref 0–35)
AST: 16 U/L (ref 0–37)
Alkaline Phosphatase: 73 U/L (ref 39–117)
Creatinine, Ser: 0.55 mg/dL (ref 0.40–1.20)
Total Bilirubin: 0.3 mg/dL (ref 0.3–1.2)

## 2010-05-09 LAB — CANCER ANTIGEN 27.29: CA 27.29: 20 U/mL (ref 0–39)

## 2010-05-27 ENCOUNTER — Encounter: Payer: Self-pay | Admitting: Oncology

## 2010-05-27 ENCOUNTER — Ambulatory Visit
Admission: RE | Admit: 2010-05-27 | Discharge: 2010-05-27 | Payer: Self-pay | Source: Home / Self Care | Attending: Oncology | Admitting: Oncology

## 2010-05-29 LAB — CBC WITH DIFFERENTIAL/PLATELET
Basophils Absolute: 0 10*3/uL (ref 0.0–0.1)
EOS%: 7.5 % — ABNORMAL HIGH (ref 0.0–7.0)
HCT: 37.6 % (ref 34.8–46.6)
HGB: 12.7 g/dL (ref 11.6–15.9)
MCH: 28.9 pg (ref 25.1–34.0)
MCV: 85.5 fL (ref 79.5–101.0)
NEUT%: 70.8 % (ref 38.4–76.8)
lymph#: 1.1 10*3/uL (ref 0.9–3.3)

## 2010-06-10 ENCOUNTER — Ambulatory Visit: Payer: Medicaid Other | Attending: Radiation Oncology | Admitting: Radiation Oncology

## 2010-06-16 ENCOUNTER — Ambulatory Visit: Payer: Self-pay | Admitting: Oncology

## 2010-06-18 LAB — COMPREHENSIVE METABOLIC PANEL
ALT: 16 U/L (ref 0–35)
AST: 16 U/L (ref 0–37)
Albumin: 4.3 g/dL (ref 3.5–5.2)
Alkaline Phosphatase: 68 U/L (ref 39–117)
BUN: 11 mg/dL (ref 6–23)
CO2: 22 mEq/L (ref 19–32)
Calcium: 9.3 mg/dL (ref 8.4–10.5)
Chloride: 104 mEq/L (ref 96–112)
Creatinine, Ser: 0.61 mg/dL (ref 0.40–1.20)
Glucose, Bld: 98 mg/dL (ref 70–99)
Potassium: 4.1 mEq/L (ref 3.5–5.3)
Sodium: 136 mEq/L (ref 135–145)
Total Bilirubin: 0.4 mg/dL (ref 0.3–1.2)
Total Protein: 7.2 g/dL (ref 6.0–8.3)

## 2010-06-18 LAB — CBC WITH DIFFERENTIAL/PLATELET
BASO%: 0.1 % (ref 0.0–2.0)
Basophils Absolute: 0 10*3/uL (ref 0.0–0.1)
EOS%: 4.8 % (ref 0.0–7.0)
Eosinophils Absolute: 0.2 10*3/uL (ref 0.0–0.5)
HCT: 37.5 % (ref 34.8–46.6)
HGB: 12.8 g/dL (ref 11.6–15.9)
LYMPH%: 10.9 % — ABNORMAL LOW (ref 14.0–49.7)
MCH: 29.7 pg (ref 25.1–34.0)
MCHC: 34.1 g/dL (ref 31.5–36.0)
MCV: 87.2 fL (ref 79.5–101.0)
MONO#: 0.4 10*3/uL (ref 0.1–0.9)
MONO%: 8.3 % (ref 0.0–14.0)
NEUT#: 3.7 10*3/uL (ref 1.5–6.5)
NEUT%: 75.9 % (ref 38.4–76.8)
Platelets: 220 10*3/uL (ref 145–400)
RBC: 4.31 10*6/uL (ref 3.70–5.45)
RDW: 15.8 % — ABNORMAL HIGH (ref 11.2–14.5)
WBC: 4.9 10*3/uL (ref 3.9–10.3)
lymph#: 0.5 10*3/uL — ABNORMAL LOW (ref 0.9–3.3)

## 2010-06-18 LAB — CANCER ANTIGEN 27.29: CA 27.29: 20 U/mL (ref 0–39)

## 2010-06-28 ENCOUNTER — Encounter: Payer: Self-pay | Admitting: Family Medicine

## 2010-07-06 ENCOUNTER — Ambulatory Visit (HOSPITAL_BASED_OUTPATIENT_CLINIC_OR_DEPARTMENT_OTHER)
Admission: RE | Admit: 2010-07-06 | Discharge: 2010-07-06 | Payer: Medicaid Other | Source: Home / Self Care | Admitting: Radiation Oncology

## 2010-07-07 NOTE — Consult Note (Signed)
Summary[facility]   Imported By: Clydell Hakim 02/26/2010 15:41:15  _____________________________________________________________________  External Attachment:    Type:   Image     Comment:   External Document

## 2010-07-07 NOTE — Consult Note (Signed)
Summary: [facility]  [facility]   Imported By: De Nurse 10/24/2009 16:46:41  _____________________________________________________________________  External Attachment:    Type:   Image     Comment:   External Document

## 2010-07-07 NOTE — Consult Note (Signed)
Summary: [facility]  [facility]   Imported By: Clydell Hakim 01/21/2010 15:47:27  _____________________________________________________________________  External Attachment:    Type:   Image     Comment:   External Document

## 2010-07-07 NOTE — Consult Note (Signed)
[facility]   Imported By: De Nurse 12/16/2009 16:00:54  _____________________________________________________________________  External Attachment:    Type:   Image     Comment:   External Document

## 2010-07-07 NOTE — Consult Note (Signed)
Summary: [facility]  [facility]   Imported By: Bradly Bienenstock 11/17/2009 12:08:27  _____________________________________________________________________  External Attachment:    Type:   Image     Comment:   External Document

## 2010-07-07 NOTE — Consult Note (Signed)
Summary: [facility]  [facility]   Imported By: Bradly Bienenstock 11/17/2009 12:07:55  _____________________________________________________________________  External Attachment:    Type:   Image     Comment:   External Document

## 2010-07-07 NOTE — Consult Note (Signed)
[facility]   Imported By: De Nurse 01/14/2010 14:15:57  _____________________________________________________________________  External Attachment:    Type:   Image     Comment:   External Document

## 2010-07-07 NOTE — Consult Note (Signed)
Summary: [facility]  [facility]   Imported By: De Nurse 04/28/2010 10:50:46  _____________________________________________________________________  External Attachment:    Type:   Image     Comment:   External Document

## 2010-07-07 NOTE — Consult Note (Signed)
[facility]   Imported By: De Nurse 02/17/2010 11:20:37  _____________________________________________________________________  External Attachment:    Type:   Image     Comment:   External Document

## 2010-07-07 NOTE — Consult Note (Signed)
[facility]   Imported By: De Nurse 12/16/2009 16:01:22  _____________________________________________________________________  External Attachment:    Type:   Image     Comment:   External Document

## 2010-07-07 NOTE — Initial Assessments (Signed)
Summary: Sore throat   Vital Signs:  Patient profile:   35 year old female O2 Sat:      98 % on Room air Temp:     101.7 degrees F rectal Pulse rate:   105 / minute Resp:     18 per minute BP supine:   153 / 92  Primary Provider:  Everrett Coombe DO   History of Present Illness: Pt is a 35 year old female with recently discovered Her2+ breast cancer s/p mastectomy with axillary dissection now on treatment with herceptin (last dose 03/28/10) who present to the ED with a one day history of sore throat, fever, shills, body aches, and nausea.  She reports being in her normal state of health two days ago and awoke this morning with the above symptoms.  She reports having vomited about three times today and has been unable to eat or drink anything.  She does not report any sick contacts.  She has body aches and a sore throat with some soreness in her neck but otherwise denies any focal pain, hematemasis, or blood in stools.  Current Problems (verified): 1)  Rhinosinusitis, Chronic  (ICD-473.8) 2)  Infiltrating Ductal Carcinoma, R Breast, Er & Pr Positive  (ICD-174.9) 3)  Hx of Prev C/s Deliv Deliv W/wo Mention Antprtm Cond  (ZOX-096.04) 4)  Large For Gestational Age  (ICD-656.60) 5)  Hx of Gestational Diabetes  (ICD-648.80) 6)  Hx of Pre-eclampsia  (ICD-642.40) 7)  Rhinitis, Allergic  (ICD-477.9) 8)  Tobacco User  (ICD-305.1) 9)  Obesity, Nos  (ICD-278.00) 10)  Depressive Disorder, Nos  (ICD-311)  Current Medications (verified): 1)  Flonase 50 Mcg/act Susp (Fluticasone Propionate) .... 2 Sprays Per Nostril Each Day 2)  Ventolin Hfa 108 (90 Base) Mcg/act Aers (Albuterol Sulfate) .Marland Kitchen.. 1-2 Puffs Q4 Hours As Needed Wheezing 3)  Neulasta 6 Mg/0.100ml Soln (Pegfilgrastim) .... With Chemo  Allergies (verified): 1)  ! Penicillin  Past History:  Past Medical History: Last updated: 11/27/2009 [id number], hx of IUGR 2/2 severe preeclampsia in 2003, A1GDM 2nd pregnancy, rpt c/s 3rd pregnancy  2010. Trichomonas 4/04 and 5/05 Hospitalization for PNA 10/2009 High grade invasive ductal carcinoma ER weakly positive, PR positive, HER2neu postive currently undergoing adjunctive chemo prior to breast surgery (Magrinot, Streck)  Family History: Last updated: 06/12/2007 mother with htn dad with type 2 dm  Social History: Last updated: 06/20/2008 In monogamous relationship since 11/05. Quit smoking 12/09.  no etoh or other drugs. works at [employer]. lives with 2 children, ages 23 and 52 (as of 1/10) does not regularly exercise.  Risk Factors: Smoking Status: quit (04/08/2010) Packs/Day: n/a (11/12/2008) Passive Smoke Exposure: no (05/20/2008)  Past Surgical History: C/S x 3 (latest 2010) S/P mastectomy for HER2+ breast cancer (2011)  Review of Systems       The patient complains of anorexia and fever.  The patient denies weight loss, weight gain, hoarseness, chest pain, syncope, peripheral edema, prolonged cough, headaches, hemoptysis, abdominal pain, melena, hematochezia, and difficulty walking.    Physical Exam  General:  alert, well-developed, well-nourished, uncomfortable-appearing, and mild distress.   Head:  normocephalic and atraumatic.   Eyes:  vision grossly intact, pupils equal, pupils round, and pupils reactive to light.   Mouth:  good dentition and pharyngeal erythema.  Tonsils are mildly enlarged with erythema but no exudates.  Tongue has brown discolorations as does the hard palate Neck:  supple and full ROM.  Occasional shotty adenopathy and mild tenderness. Lungs:  Normal respiratory effort, chest  expands symmetrically. Lungs are clear to auscultation, no crackles or wheezes. Heart:  Normal rate and regular rhythm. S1 and S2 normal without gallop, murmur, click, rub or other extra sounds. Abdomen:  Bowel sounds positive,abdomen soft and non-tender without masses, organomegaly or hernias noted. Msk:  No deformity or scoliosis noted of thoracic or lumbar spine.    Extremities:  No clubbing, cyanosis, edema, or deformity noted with normal full range of motion of all joints.   Skin:  Intact without suspicious lesions or rashes.  Port present in uper left chest, is currently accessed and covered by teguderm.  12.6>11.6<249 137/3.2/106/22/5/0.53<101, AST 17, ALT 20 Lactic acid 1 HCG - UA Unremarkable Rapid strep +   Impression & Recommendations:  Problem # 1:  STREP THROAT (ICD-034.0) The patient reports a sore throat associated with fevers, chills, an objective temperature, a white count, and a positive rapid strep test.  It is very likely that all her symptoms are associated with strep throat, though influenza cannot be ruled out due to her history of body aches.  While the patient is not classified as truly immunosuppressed while on treatment with herceptin, we feel that, given the severity of her presentation, at least some time in observation in the hospital is warrented.  We will continue to give the patient IV fluids, tylenol/motrin/morphine for pain/fever control, zofran for nausea, and azithromycin for treatment of her infection.  We will obtain blood cultures, a rapid flu, and monospot.  While it is unlikely that this patient is bacteremic, the patient's cancer history is concerning enough that we feel it warrents some thought.  Problem # 2:  INFILTRATING DUCTAL CARCINOMA, R BREAST, ER & PR POSITIVE (ICD-174.9) Pt is scheduled to undergo her next herceptin treatment in two days.  We will contact her oncologist to inform of her current condition and will let him decide if this changes her treatment regimin.  Problem # 3:  FEN/GI ad lib.  Will add K to her fluids as it is slightly low on her BMET from the ED.  Problem # 4:  Prophylaxsis Subcutaneously Heparin for VTE Zofran for nausea Tylenol for fever Motrin for pain Morphine for severe pain  Problem # 5:  Disposition Likely home within 24 hours after symptoms improve  Complete  Medication List: 1)  Flonase 50 Mcg/act Susp (Fluticasone propionate) .... 2 sprays per nostril each day 2)  Ventolin Hfa 108 (90 Base) Mcg/act Aers (Albuterol sulfate) .Marland Kitchen.. 1-2 puffs q4 hours as needed wheezing 3)  Neulasta 6 Mg/0.17ml Soln (Pegfilgrastim) .... With chemo 4)  Ativan 0.5 Mg Tabs (Lorazepam) .Marland Kitchen.. 1-2 tabs three times a day as needed 5)  Neurontin 300 Mg Caps (Gabapentin) .Marland Kitchen.. 1 tab three times a day  Appended Document: Sore throat R2 note See complete R1 note for full H and P HPI: pt with hx of Breast Ca currently treated with mastectomy and lymph node dissection as well as herceptin, presents with one day of fever, chills and body aches as well as sore throat.  She has had N/V and 3 episodes of yellow emesis.  She denies abd pain, joint pain, HA, blurry vision, or stiff neck.  She denies diarrhea, hematemesis, heartburn.  PE:  GEN: uncomfortable appearing, lying in bed HEENT: mm tachy, tongue is discolored, tonsils appear mildly enlarged, no exudates Neck: supple, few cervical lymph nodes noted CV: RRR, no m/r/g Resp: CTAB ABD: obese, soft, NT, ND Ext: warm, no edema Skin: warm and dry, recent mastectomy site healing with some  drainage.  A/P: 35 y/o s/p R mastectomy, chemotherapy and herceptin treatment with likely strep pharyngititis 1.   Sore throat, fever- Pt most likely with strep pharyngitis given positive rapid strep in ED.  Will check blood cultures and flu.  Will start azithromycin since pt is PCN allergic.  Will give Tylenol, motrin for fever and pain and morphine for severe pain. 2.   Anxiety- will continue pt's home ativan. 3.   Breast Ca- Pt is only receiving herceptin at this point in her treatment.  This should not be immune suppressive.  Pt will follow up with her oncologist at discharge. 4.   PPX- heparin 5000 units TID 5.   Dispo- likely a short admission with discharge home in the AM.

## 2010-07-07 NOTE — Consult Note (Signed)
Summary: [facility]  [facility]   Imported By: Clydell Hakim 10/02/2009 16:27:47  _____________________________________________________________________  External Attachment:    Type:   Image     Comment:   External Document

## 2010-07-07 NOTE — Consult Note (Signed)
Summary: [facility]  [facility]   Imported By: Bradly Bienenstock 11/29/2009 13:37:06  _____________________________________________________________________  External Attachment:    Type:   Image     Comment:   External Document

## 2010-07-07 NOTE — Consult Note (Signed)
[facility]   Imported By: Clydell Hakim 02/26/2010 15:40:44  _____________________________________________________________________  External Attachment:    Type:   Image     Comment:   External Document

## 2010-07-07 NOTE — Miscellaneous (Signed)
Summary: feels worse   Clinical Lists Changes was here yesterday. feels even worse. inhaler does not help per pt. states she is full of mucus. did not get the chest xray yesterday. she will be seen at 10 by Dr. Alvester Morin.Golden Circle RN  Oct 22, 2009 9:40 AM  can she get xray prior to coming in?  Eustaquio Boyden  MD  Oct 22, 2009 9:42 AM   sent to [facility] now to get x ray. faxed the order to them. states she was not given any info about this & did not get a paper to bring to radiology.Golden Circle RN  Oct 22, 2009 9:48 AM  thanks. Eustaquio Boyden  MD  Oct 22, 2009 9:58 AM    Appended Document: feels worse unable to get in until PM 2/2 Xray delay  Appended Document: feels worse (late entry) called at 3:45pm.  no answer.  called and asked to come in this afternoon.

## 2010-07-07 NOTE — Consult Note (Signed)
[facility]   Imported By: De Nurse 02/17/2010 11:20:57  _____________________________________________________________________  External Attachment:    Type:   Image     Comment:   External Document

## 2010-07-07 NOTE — Assessment & Plan Note (Signed)
Summary: chronic rhinosinusitis   Vital Signs:  Patient profile:   35 year old female Height:      64 inches Weight:      226 pounds BMI:     38.93 Temp:     98.0 degrees F oral Pulse rate:   77 / minute BP sitting:   132 / 78  (left arm) Cuff size:   large  Vitals Entered By: Tessie Fass CMA (April 08, 2010 9:36 AM) CC: sinus infection? Is Patient Diabetic? No Pain Assessment Patient in pain? yes     Location: head Intensity: 5   Primary Care Provider:  Everrett Coombe DO  CC:  sinus infection?.  History of Present Illness:  + Sinus congestion, rhinorrhea, post nasal drip per pt for last 1-2 weeks. No dyspnea, fever, unilateral sinus tenderness. Has hx/o allergic rhinits per pt. Has not taken flonase >1 year. Has been intermittently using clarinex for allergies. Was recently given z-pack 1 month ago w. minimal relief in symptoms. Pt reports sinus surgery >10 years ago. Pt s/p radical R mastectomy 03/11/10. Currently on heceptin. However, currently not on chemotherapy/has not yet recieved radiation. No recent immunosuppressive therapy per pt.   Habits & Providers  Alcohol-Tobacco-Diet     Tobacco Status: quit  Allergies: No Known Drug Allergies  Physical Exam  General:  alert and well-nourished.   Head:  normocephalic and no abnormalities observed.   Eyes:  vision grossly intact.   Ears:  TMs distended bilaterally. no peripheral erythema  Nose:  no external deformity. + Nasal erythema and swelling bilaterally.  Mouth:  + post oropharyngeal erythema Neck:  supple, full ROM, no LAD Lungs:  CTAB Heart:  RRR   Impression & Recommendations:  Problem # 1:  RHINOSINUSITIS, CHRONIC (ICD-473.8) Pt with chronic rhinosinusitis which is currnetly being partially treated. Instructed pt to resume nasal steroid as well as continue daily antihustamine. Discussed infectious red flags. Pt instructed to follwup with PCP in 2 weeks to reassess sinus sxs. Pt agreeable to plan.    Her updated medication list for this problem includes:    Flonase 50 Mcg/act Susp (Fluticasone propionate) .Marland Kitchen... 2 sprays per nostril each day  Orders: [facility]- Est Level  3 (27253)  Complete Medication List: 1)  Flonase 50 Mcg/act Susp (Fluticasone propionate) .... 2 sprays per nostril each day 2)  Claritin 10 Mg Tabs (Loratadine) .... Once daily as needed allergy 3)  Cenogen Ultra Caps (Prenatal w/o a vit-fe fum-fa) .... One by mouth daily 4)  Ventolin Hfa 108 (90 Base) Mcg/act Aers (Albuterol sulfate) .Marland Kitchen.. 1-2 puffs q4 hours as needed wheezing 5)  Neulasta 6 Mg/0.40ml Soln (Pegfilgrastim) .... With chemo  Patient Instructions: 1)  It was good to meet you today 2)  I am recommending restarting your flonase for your nasal congestion 3)  I dont think that we need to start antibiotics right now 4)  Followup with Dr. Ashley Royalty in the next 2-4 weeks for followup in your nasal symptoms 5)  If you develop any fever, headache, or any other concerning symptoms, please let us know.  6)  Otherwise call with any questions 7)  God Bless, 8)  Doree Albee MD  Prescriptions: Aleda Grana 50 MCG/ACT SUSP (FLUTICASONE PROPIONATE) 2 sprays per nostril each day  #1 x 11   Entered and Authorized by:   Doree Albee MD   Signed by:   Doree Albee MD on 04/08/2010   Method used:   Print then Give to Patient   RxID:  [id number]    Orders Added: 1)  [facility]- Est Level  3 [14782]

## 2010-07-07 NOTE — Consult Note (Signed)
Summary: [facility]  [facility]   Imported By: Clydell Hakim 01/12/2010 16:36:42  _____________________________________________________________________  External Attachment:    Type:   Image     Comment:   External Document

## 2010-07-07 NOTE — Consult Note (Signed)
Summary: [facility] high grade invasive ductal carcinoma  [facility]   Imported By: Clydell Hakim 09/30/2009 16:11:16  _____________________________________________________________________  External Attachment:    Type:   Image     Comment:   External Document

## 2010-07-07 NOTE — Consult Note (Signed)
[facility]   Imported By: De Nurse 03/25/2010 15:23:56  _____________________________________________________________________  External Attachment:    Type:   Image     Comment:   External Document

## 2010-07-07 NOTE — Consult Note (Signed)
Summary: [facility]  [facility]   Imported By: Clydell Hakim 10/06/2009 11:15:53  _____________________________________________________________________  External Attachment:    Type:   Image     Comment:   External Document

## 2010-07-07 NOTE — Consult Note (Signed)
[facility]   Imported By: De Nurse 03/25/2010 15:24:18  _____________________________________________________________________  External Attachment:    Type:   Image     Comment:   External Document

## 2010-07-07 NOTE — Miscellaneous (Signed)
Summary: LM   Clinical Lists Changes LM that we need to speak with her. she did not come to her appt here. this is the 2nd message I have left. MD states he is trying to reach her as well. please send the call to me when she calls back.Golden Circle RN  Oct 22, 2009 3:50 PM  Admitted today to [facility] by Dr. Darnelle Catalan for 23 hour obs and PNA treatment.  Chest angio negative for PE but postiive for PNA. Eustaquio Boyden  MD  Oct 22, 2009 8:00 PM

## 2010-07-07 NOTE — Miscellaneous (Signed)
Summary: PATIENT SUMMARY   Clinical Lists Changes  Problems: Removed problem of COUGH (ICD-786.2) Added new problem of History of  PNEUMONIA (ICD-486) Observations: Added new observation of SURGNAME: Streck (11/27/2009 16:52) Added new observation of PAST MED HX: G3P2103, hx of IUGR 2/2 severe preeclampsia in 2003, A1GDM 2nd pregnancy, rpt c/s 3rd pregnancy 2010. Trichomonas 4/04 and 5/05 Hospitalization for PNA 10/2009 High grade invasive ductal carcinoma ER weakly positive, PR positive, HER2neu postive currently undergoing adjunctive chemo prior to breast surgery (Magrinot, Streck) (11/27/2009 16:52) Added new observation of PMH OTHER: Past medical, surgical, family and social histories (including risk factors) reviewed for relevance to current acute and chronic problems. (11/27/2009 16:52) Added new observation of SH REVIEWED: reviewed - no changes required (11/27/2009 16:52) Added new observation of FH REVIEWED: reviewed - no changes required (11/27/2009 16:52) Added new observation of PSH REVIEWED: reviewed - no changes required (11/27/2009 16:52) Added new observation of PMH REVIEWED: reviewed - no changes required (11/27/2009 16:52) Added new observation of ONCOLOGYMD: Magrinot (11/27/2009 16:52)     Very Sweet 35 year old who was found to have locally invasive R breast ductal carcinoma.  Currently undergoing chemotherapy for cancer reduction prior to surgical removal.  Habits & Providers     Oncologist: Magrinot     Surgeon: Jamey Ripa   Past History:  Past medical, surgical, family and social histories (including risk factors) reviewed for relevance to current acute and chronic problems.  Past Medical History: G3P2103, hx of IUGR 2/2 severe preeclampsia in 2003, A1GDM 2nd pregnancy, rpt c/s 3rd pregnancy 2010. Trichomonas 4/04 and 5/05 Hospitalization for PNA 10/2009 High grade invasive ductal carcinoma ER weakly positive, PR positive, HER2neu postive currently undergoing  adjunctive chemo prior to breast surgery (Magrinot, Streck)  Past Surgical History: Reviewed history from 01/07/2009 and no changes required. C/S x 3 (latest 2010)

## 2010-07-07 NOTE — Letter (Signed)
Summary: Generic Letter  [facility]  [address]   [place], [place] [address]   Phone: [phone]  Fax: [phone]    08/29/2009  [facility] [address] [place], [place]  [address]  Dear Wende Bushy,   I tried calling you this week but was unable to reach you.  Please call us to update your phone number and let us know when is a good time to reach you at.   The number we have on file is [phone].     Sincerely,   Eustaquio Boyden  MD

## 2010-07-07 NOTE — Assessment & Plan Note (Signed)
Summary: n&v/per Karen Hopkins/breast swelling/eo   Vital Signs:  Patient profile:   35 year old female Temp:     98.7 degrees F oral Pulse rate:   92 / minute BP sitting:   140 / 83  (right arm)  Vitals Entered By: Renato Battles slade,cma CC: check right breast. n/v/d x 3 days. LMP 07-05-08   Primary Care Provider:  Eustaquio Boyden  MD  CC:  check right breast. n/v/d x 3 days. LMP 07-05-08.  History of Present Illness: CC: n/v/d and 1 mo h/o right breast swelling  1. N/v/d - children sick with same.  Soul started with n/v x 4 (food then yellow) then progressed to diarrhea watery.  Also having body aches and chills.  No fevers.  good appetite but vomits after anything she eats.  2. 1 mo h/o right breast swelling and redness, also states notes lumpy areas inferior breast.  No fevers, discharge.  Clyde [place] is 5 mo old and mom is not breastfeeding.  no fm hx of brca.  Habits & Providers  Alcohol-Tobacco-Diet     Tobacco Status: quit  Current Medications (verified): 1)  Flonase 50 Mcg/act Susp (Fluticasone Propionate) .... 2 Sprays Per Nostril Each Day 2)  Claritin 10 Mg Tabs (Loratadine) .... Once Daily As Needed Allergy 3)  Metrogel 1 % Gel (Metronidazole) .Marland Kitchen.. 1 Applicatorful Two Times A Day X 5-7 Days 4)  Cenogen Ultra  Caps (Prenatal W/o A Vit-Fe Fum-Fa) .... One By Mouth Daily 5)  Ventolin Hfa 108 (90 Base) Mcg/act Aers (Albuterol Sulfate) .Marland Kitchen.. 1-2 Puffs Q4 Hours As Needed Wheezing 6)  Zofran 4 Mg Tabs (Ondansetron Hcl) .... Take One By Mouth Q4 Hours As Needed Nausea 7)  Bactrim Ds 800-160 Mg Tabs (Sulfamethoxazole-Trimethoprim) .... Take Two By Mouth Two Times A Day X 10 Days  Allergies (verified): No Known Drug Allergies  Past History:  Past medical, surgical, family and social histories (including risk factors) reviewed for relevance to current acute and chronic problems.  Past Medical History: Reviewed history from 01/07/2009 and no changes required. [id number], hx of IUGR  2/2 preeclampsia in 2003, severe preeclampsia 2003, Trichomonas 4/04 and 5/05, A1GDM 2nd pregnancy, rpt c/s 3rd pregnancy 2010.  Past Surgical History: Reviewed history from 01/07/2009 and no changes required. C/S x 3 (latest 2010)  Family History: Reviewed history from 06/12/2007 and no changes required. mother with htn dad with type 2 dm  Social History: Reviewed history from 06/20/2008 and no changes required. In monogamous relationship since 11/05. Quit smoking 12/09.  no etoh or other drugs. works at [employer]. lives with 2 children, ages 67 and 69 (as of 1/10) does not regularly exercise.  Physical Exam  General:  Well-developed,well-nourished,in no acute distress; alert,appropriate and cooperative throughout examination Breasts:  left breast WNL.  right breast - slightyl erythematous inferior quadrants, significantly swollen and warm, with induration bilateral lower quadrants.  Also tender to palpation.  No discrete masses noted.  No galactorrhea or discharge. Lungs:  Normal respiratory effort, chest expands symmetrically. Lungs are clear to auscultation, no crackles or wheezes. Heart:  Normal rate and regular rhythm. S1 and S2 normal without gallop, murmur, click, rub or other extra sounds. Abdomen:  Bowel sounds positive,abdomen soft and slightly tender to palpation of upper quadrants without masses, organomegaly or hernias noted.   Impression & Recommendations:  Problem # 1:  HYPERTROPHY OF BREAST, RIGHT (ICD-611.1) warm, erythematous, tender.  will treat as mastitis with bactrim DS 2 tabs two times a day x 10 days  and refer for [place] to eval for underlying abscess as it has been going on for 1 month.  Also eval for blocked duct although somewhat unusual given duration of time since delivery (78mo).  no fm hx of brca.  RTC 2 wks for f/u and ensure resolving.  Also advised to use warm compresses alternating with ice pack  Orders: [facility]- Est  Level 4 (16109) Ultrasound  (Ultrasound)  Problem # 2:  VIRAL GASTROENTERITIS (ICD-008.8) 1d history.  now progressing to diarrhea.  zofran for nausea sent to [facility].  push fluids.  red flags to retrun discussed. Orders: [facility]- Est  Level 4 (99214)  Complete Medication List: 1)  Flonase 50 Mcg/act Susp (Fluticasone propionate) .... 2 sprays per nostril each day 2)  Claritin 10 Mg Tabs (Loratadine) .... Once daily as needed allergy 3)  Metrogel 1 % Gel (Metronidazole) .Marland Kitchen.. 1 applicatorful two times a day x 5-7 days 4)  Cenogen Ultra Caps (Prenatal w/o a vit-fe fum-fa) .... One by mouth daily 5)  Ventolin Hfa 108 (90 Base) Mcg/act Aers (Albuterol sulfate) .Marland Kitchen.. 1-2 puffs q4 hours as needed wheezing 6)  Zofran 4 Mg Tabs (Ondansetron hcl) .... Take one by mouth q4 hours as needed nausea 7)  Bactrim Ds 800-160 Mg Tabs (Sulfamethoxazole-trimethoprim) .... Take two by mouth two times a day x 10 days  Patient Instructions: 1)  Please return in 2 wks, sooner if breast not improving. 2)  We will treat your breast as an infection, but I would also like you to get an ultrasound of the breast to ensure there is not an abscess that has caused this to take so long in resolving. 3)  For your nausea/vomiting I will treat with Zofran (to help with nausea).  For your diarrhea, the best thing is to wait for it to resolve on it's own,  If taking more than a few days, you can continue using immodium.  If you see any blood or mucous in the stool, or high fevers, please return to be seen again. 4)  Remember the most important thing is to maintain adequate hydration status.  Push fluids as much as possible. 5)  Call clinic with questions. Prescriptions: ZOFRAN 4 MG TABS (ONDANSETRON HCL) take one by mouth q4 hours as needed nausea  #20 x 0   Entered and Authorized by:   Eustaquio Boyden  MD   Signed by:   Eustaquio Boyden  MD on 07/08/2009   Method used:   Print then Give to Patient   RxID:   [id number] BACTRIM DS 800-160 MG TABS  (SULFAMETHOXAZOLE-TRIMETHOPRIM) take two by mouth two times a day x 10 days  #40 x 0   Entered and Authorized by:   Eustaquio Boyden  MD   Signed by:   Eustaquio Boyden  MD on 07/08/2009   Method used:   Electronically to        [facility]. [phone]* (retail)       [address]       [place], [place]  [address]       Ph: [phone]       Fax: [phone]   RxID:   [id number] ZOFRAN 4 MG TABS (ONDANSETRON HCL) take one by mouth q4 hours as needed nausea  #20 x 0   Entered and Authorized by:   Eustaquio Boyden  MD   Signed by:   Eustaquio Boyden  MD on 07/08/2009   Method used:  Electronically to        [facility]. [phone]* (retail)       [address]       [place], [place]  [address]       Ph: [phone]       Fax: [phone]   RxID:   [id number]

## 2010-07-07 NOTE — Consult Note (Signed)
[facility]   Imported By: De Nurse 10/24/2009 16:48:11  _____________________________________________________________________  External Attachment:    Type:   Image     Comment:   External Document

## 2010-07-07 NOTE — Consult Note (Signed)
[facility]   Imported By: Clydell Hakim 01/29/2010 11:28:33  _____________________________________________________________________  External Attachment:    Type:   Image     Comment:   External Document

## 2010-07-07 NOTE — Consult Note (Signed)
Summary: [facility]  [facility]   Imported By: Clydell Hakim 01/02/2010 14:28:54  _____________________________________________________________________  External Attachment:    Type:   Image     Comment:   External Document

## 2010-07-07 NOTE — Consult Note (Signed)
Summary: [facility] - inflammatory BRCA, to get port a cath  [facility]   Imported By: Clydell Hakim 09/30/2009 16:12:05  _____________________________________________________________________  External Attachment:    Type:   Image     Comment:   External Document

## 2010-07-07 NOTE — Progress Notes (Signed)
Summary: re: breast cancer   Phone Note Outgoing Call Call back at [address] Phone [phone]   Summary of Call: Calling [facility] to discuss breast cancer diagnosis.  Phone # does not accept incoming calls. Initial call taken by: Eustaquio Boyden  MD,  August 29, 2009 10:09 AM  Follow-up for Phone Call        CALLED [facility]. PT'S APPT IS AT [address] [facility]. Olegario Messier from the [facility] will fax original copy of Ins. to me and I will get PA for the MRI. Follow-up by: Arlyss Repress CMA,,  August 29, 2009 10:40 AM

## 2010-07-07 NOTE — Miscellaneous (Signed)
Summary: re: PA for MRI/TS  called the [facility] to confirm pt's insurance. They faxed [employer] ins. card to [place].  1.) called [employer]. pt's insurance there was d/c 05-2009 ([employer]) 2.) tried PA on line through pt's medicaid #. pt does not have Medicaid. 3.) called Danford Bad at [facility] and informed of above. Pt has appt for right breast MRI 09-02-09 at 1:45pm. The [facility] informed the pt of the appt. Dr.Gutierrez was unable to reach the pt.Arlyss Repress CMA,  August 29, 2009 12:30 PM

## 2010-07-07 NOTE — Miscellaneous (Signed)
Summary: RE: NEEDLE BIOPSY/TS  called pt. pt does not have Insurance at this time. She applied for Medicaid, also applied for financial assistance with [employer]. She reports, that she will start Chemo next week and that she has a good support system through her family and friends. I asked the pt to sched. a f/up appt with Dr.G. and she agreed. I told the pt, to call us, if we can help. Pt agreed. called the [facility]. LMVM pt does not have insurance. waiting for a call back from the [facility].Arlyss Repress CMA,  September 15, 2009 4:44 PM  thank you!  i had been trynig to reach patient without success.  I'd like to see her after chemo. Eustaquio Boyden  MD  September 15, 2009 4:48 PM

## 2010-07-07 NOTE — Assessment & Plan Note (Signed)
Summary: cough   Vital Signs:  Patient profile:   35 year old female Height:      64 inches Weight:      200 pounds BMI:     34.45 O2 Sat:      99 % on Room air Temp:     97.5 degrees F oral Pulse rate:   101 / minute BP sitting:   117 / 80  (right arm) Cuff size:   regular  Vitals Entered By: Tessie Fass CMA (Oct 21, 2009 9:27 AM)  O2 Flow:  Room air  Primary Care Provider:  Eustaquio Boyden  MD   History of Present Illness: CC: cough  35 yo with h/o asthma, recent h/o breast cancer currently on chemotherapy every 2 wks, to start weekly chemo in June.  Receives Neupogen (neulasta) after each chemo injection.   Several day history of not feeling well.  + SOB, productive cough, wheezing.  No fevers.  Feels chest tight, has not tried albuterol 2/2 concern with chemo.    11 mo child with congestion at home.  Feeling at times overwhelmed with cancer dx, other times feels doing well.  Started on celexa per Onc, stopped 2/2 nausea.  considering trial again of this med.  Current Medications (verified): 1)  Flonase 50 Mcg/act Susp (Fluticasone Propionate) .... 2 Sprays Per Nostril Each Day 2)  Claritin 10 Mg Tabs (Loratadine) .... Once Daily As Needed Allergy 3)  Cenogen Ultra  Caps (Prenatal W/o A Vit-Fe Fum-Fa) .... One By Mouth Daily 4)  Ventolin Hfa 108 (90 Base) Mcg/act Aers (Albuterol Sulfate) .Marland Kitchen.. 1-2 Puffs Q4 Hours As Needed Wheezing 5)  Neulasta 6 Mg/0.52ml Soln (Pegfilgrastim) .... With Chemo  Allergies (verified): No Known Drug Allergies  Past History:  Past Medical History: G3P2103, hx of IUGR 2/2 preeclampsia in 2003, severe preeclampsia 2003, Trichomonas 4/04 and 5/05, A1GDM 2nd pregnancy, rpt c/s 3rd pregnancy 2010. High grade invasive ductal carcinoma ER weakly positive, PR positive, HER2neu postive PMH-FH-SH reviewed for relevance  Physical Exam  General:  WDWN, NAD, alert, tired appearing, nontoxic Lungs:  wheeze throughout, tight, rales/rhonchi LLL.   after albuterol/atrovent neb, improved air movement, no longer wheezy. Heart:  Normal rate and regular rhythm. S1 and S2 normal without gallop, murmur, click, rub or other extra sounds. Extremities:  No clubbing, cyanosis, edema, or deformity noted with normal full range of motion of all joints.   Skin:  Intact without suspicious lesions or rashes   Impression & Recommendations:  Problem # 1:  COUGH (ICD-786.2) cxr today to eval rales LLL.  albuterol Q4 hours scheduled.  f/u with onc thursday.  CBC today.  much improved after breathing treatment today.  f/u closely with onc.  call with results of CBC/CXR.  [phone]  Orders: CXR- 2view (CXR) CBC w/Diff-[facility] (85025) [facility]- Est  Level 4 (99214) Albuterol Sulfate Sol 1mg  unit dose (A5409) Atrovent 1mg  (Neb) (W1191)  Problem # 2:  INFILTRATING DUCTAL CARCINOMA, LEFT BREAST, ER & PR POSITIVE (ICD-174.9) reviewed recent notes.  discussed depression with patient.  does not desire pharmacotherapy currently.  to return to see me in 1 month, sooner if needed.  Complete Medication List: 1)  Flonase 50 Mcg/act Susp (Fluticasone propionate) .... 2 sprays per nostril each day 2)  Claritin 10 Mg Tabs (Loratadine) .... Once daily as needed allergy 3)  Cenogen Ultra Caps (Prenatal w/o a vit-fe fum-fa) .... One by mouth daily 4)  Ventolin Hfa 108 (90 Base) Mcg/act Aers (Albuterol sulfate) .Marland Kitchen.. 1-2 puffs  q4 hours as needed wheezing 5)  Neulasta 6 Mg/0.61ml Soln (Pegfilgrastim) .... With chemo  Patient Instructions: 1)  I'm a little worried about your cough.   2)  Chest xray today to see if we see any reason in your lungs for the cough.  Blood work today to look for infection. 3)  Follow up with Dr. Darnelle Catalan Thursday. 4)  Take albuterol every 4-6 hours scheduled over the next few days. 5)  I will call you with results.   Medication Administration  Medication # 1:    Medication: Albuterol Sulfate Sol 1mg  unit dose    Diagnosis: COUGH (ICD-786.2)     Dose: 2.5mg /    Route: inhaled    Exp Date: 05/2011    Lot #: [id number]    Mfr: nephron    Comments: 2.5mg /57ml given    Patient tolerated medication without complications    Given by: Tessie Fass CMA (Oct 21, 2009 10:27 AM)  Medication # 2:    Medication: Atrovent 1mg  (Neb)    Diagnosis: COUGH (ICD-786.2)    Dose: 0.5mg     Route: inhaled    Exp Date: 01/2011    Lot #: [id number]    Mfr: nephron    Comments: 0.5mg /2.2ml given    Patient tolerated medication without complications    Given by: Tessie Fass CMA (Oct 21, 2009 10:29 AM)  Orders Added: 1)  CXR- 2view [CXR] 2)  CBC w/Diff-[facility] [85025] 3)  [facility]- Est  Level 4 [47829] 4)  Albuterol Sulfate Sol 1mg  unit dose [J7613] 5)  Atrovent 1mg  (Neb) [F6213]

## 2010-07-07 NOTE — Consult Note (Signed)
Summary: [facility]  [facility]   Imported By: Bradly Bienenstock 01/05/2010 12:18:04  _____________________________________________________________________  External Attachment:    Type:   Image     Comment:   External Document

## 2010-07-07 NOTE — Consult Note (Signed)
Summary: [facility]  [facility]   Imported By: Clydell Hakim 11/03/2009 09:20:08  _____________________________________________________________________  External Attachment:    Type:   Image     Comment:   External Document

## 2010-07-08 ENCOUNTER — Ambulatory Visit: Payer: Self-pay | Admitting: Radiation Oncology

## 2010-07-10 ENCOUNTER — Encounter (HOSPITAL_BASED_OUTPATIENT_CLINIC_OR_DEPARTMENT_OTHER): Payer: Medicaid Other | Admitting: Oncology

## 2010-07-10 DIAGNOSIS — Z170 Estrogen receptor positive status [ER+]: Secondary | ICD-10-CM

## 2010-07-10 DIAGNOSIS — Z5112 Encounter for antineoplastic immunotherapy: Secondary | ICD-10-CM

## 2010-07-10 DIAGNOSIS — C50419 Malignant neoplasm of upper-outer quadrant of unspecified female breast: Secondary | ICD-10-CM

## 2010-07-10 LAB — CBC WITH DIFFERENTIAL/PLATELET
Basophils Absolute: 0 10*3/uL (ref 0.0–0.1)
Eosinophils Absolute: 0.3 10*3/uL (ref 0.0–0.5)
HGB: 12.4 g/dL (ref 11.6–15.9)
MONO#: 0.5 10*3/uL (ref 0.1–0.9)
MONO%: 8 % (ref 0.0–14.0)
NEUT#: 4.6 10*3/uL (ref 1.5–6.5)
RBC: 4.27 10*6/uL (ref 3.70–5.45)
RDW: 14.6 % — ABNORMAL HIGH (ref 11.2–14.5)
WBC: 6.6 10*3/uL (ref 3.9–10.3)
lymph#: 1.2 10*3/uL (ref 0.9–3.3)

## 2010-07-13 ENCOUNTER — Encounter: Payer: Self-pay | Admitting: *Deleted

## 2010-07-16 ENCOUNTER — Ambulatory Visit: Payer: Medicaid Other | Attending: Oncology | Admitting: Physical Therapy

## 2010-07-16 DIAGNOSIS — IMO0001 Reserved for inherently not codable concepts without codable children: Secondary | ICD-10-CM | POA: Insufficient documentation

## 2010-07-16 DIAGNOSIS — R0789 Other chest pain: Secondary | ICD-10-CM | POA: Insufficient documentation

## 2010-07-16 DIAGNOSIS — M79609 Pain in unspecified limb: Secondary | ICD-10-CM | POA: Insufficient documentation

## 2010-07-16 DIAGNOSIS — M25619 Stiffness of unspecified shoulder, not elsewhere classified: Secondary | ICD-10-CM | POA: Insufficient documentation

## 2010-07-20 ENCOUNTER — Ambulatory Visit: Payer: Medicaid Other | Admitting: Physical Therapy

## 2010-07-29 ENCOUNTER — Ambulatory Visit: Payer: Medicaid Other | Admitting: Physical Therapy

## 2010-07-31 ENCOUNTER — Other Ambulatory Visit: Payer: Self-pay | Admitting: Oncology

## 2010-07-31 ENCOUNTER — Encounter (HOSPITAL_BASED_OUTPATIENT_CLINIC_OR_DEPARTMENT_OTHER): Payer: Medicaid Other | Admitting: Oncology

## 2010-07-31 DIAGNOSIS — G5690 Unspecified mononeuropathy of unspecified upper limb: Secondary | ICD-10-CM

## 2010-07-31 DIAGNOSIS — C50419 Malignant neoplasm of upper-outer quadrant of unspecified female breast: Secondary | ICD-10-CM

## 2010-07-31 DIAGNOSIS — Z5112 Encounter for antineoplastic immunotherapy: Secondary | ICD-10-CM

## 2010-07-31 DIAGNOSIS — Z170 Estrogen receptor positive status [ER+]: Secondary | ICD-10-CM

## 2010-07-31 LAB — CBC WITH DIFFERENTIAL/PLATELET
BASO%: 0.1 % (ref 0.0–2.0)
EOS%: 7.2 % — ABNORMAL HIGH (ref 0.0–7.0)
LYMPH%: 22.2 % (ref 14.0–49.7)
MCH: 29 pg (ref 25.1–34.0)
MCHC: 33.4 g/dL (ref 31.5–36.0)
MCV: 86.6 fL (ref 79.5–101.0)
MONO%: 6.4 % (ref 0.0–14.0)
Platelets: 251 10*3/uL (ref 145–400)
RBC: 4.11 10*6/uL (ref 3.70–5.45)
WBC: 7.1 10*3/uL (ref 3.9–10.3)

## 2010-08-05 ENCOUNTER — Encounter: Payer: Medicaid Other | Admitting: Physical Therapy

## 2010-08-07 ENCOUNTER — Ambulatory Visit: Payer: Medicaid Other | Attending: Radiation Oncology | Admitting: Radiation Oncology

## 2010-08-12 ENCOUNTER — Encounter: Payer: Self-pay | Admitting: Family Medicine

## 2010-08-12 ENCOUNTER — Ambulatory Visit (INDEPENDENT_AMBULATORY_CARE_PROVIDER_SITE_OTHER): Payer: Medicaid Other | Admitting: Family Medicine

## 2010-08-12 VITALS — BP 147/89 | HR 105 | Temp 98.7°F | Ht 64.0 in | Wt 248.0 lb

## 2010-08-12 DIAGNOSIS — J329 Chronic sinusitis, unspecified: Secondary | ICD-10-CM

## 2010-08-12 MED ORDER — AZITHROMYCIN 500 MG PO TABS: 500.0000 mg | ORAL_TABLET | Freq: Every day | ORAL | Status: AC

## 2010-08-12 NOTE — Patient Instructions (Signed)
Take zithromax as directed For symptoms: Afrin nose spray (use no more than 3 days) Saline nasal spray Motrin or tylenol as needed for pressure.

## 2010-08-12 NOTE — Progress Notes (Signed)
  Subjective:    Patient ID: Karen Hopkins, female    DOB: Jun 16, 1975, 35 y.o.   MRN: [id number]  HPI Cold symptoms x 1 week: Yellow nasal drainage,  has had some nose bleeds ( minimal amount), pressure behind eyes. Pt states "it feels like sinusitis"  Took otc cold medication this am, did not help.  Pt currently receiving chemo for breast cancer.    No sore throat. No cough. No fever.  No n/v/d.  No eye drainage.      Review of Systems     Objective:   Physical Exam  Constitutional: She appears well-developed.       obese  HENT:       No throat erythema.  + clear nasal drainage.  Nasal passageway reddened and inflammed.  + pain with palpation of areas of frontal and maxillary sinus.  Eyes: Conjunctivae are normal. Pupils are equal, round, and reactive to light.  Cardiovascular: Normal rate, regular rhythm and normal heart sounds.   Pulmonary/Chest: Effort normal and breath sounds normal. No respiratory distress. She has no wheezes.  Abdominal: Soft.          Assessment & Plan:

## 2010-08-12 NOTE — Assessment & Plan Note (Signed)
Pt most likely has viral sinusitis.  Yet since she is receiving chemo and is immunocompromised she may not mount a fever response to infection.  Also she is at increased risk for secondary bacterial infections.  Therefore will give pt zithromax x 3 days.  Told pt that if she doesn't respond it may be b/c this is viral--which could take 2 weeks to resolve.  If pt is not improving within the next 1-2 weeks she is to return for recheck.

## 2010-08-17 ENCOUNTER — Telehealth: Payer: Self-pay | Admitting: Family Medicine

## 2010-08-17 NOTE — Telephone Encounter (Signed)
Pt states she has been throwing up since last night and feels achy today.  Not sure if she should come in.

## 2010-08-17 NOTE — Telephone Encounter (Signed)
Patient began throwing up after lunch yesterday, N&V continued throughout the night.  No longer throwing up but still has nausea and now has body aches.  Denies fever or HA.  Told her there was not much we could do for flu like sx.  Recommended that she get as much rest as possible today, sip on fluids, and to take OTC's for flu like sx.   States that she does have Phenergan so told her it was okay to try one pill to see if the nausea did ease off.  If she develops fever or diarrhea to call us back tomorrow and we could work her in.  Pt agreeable.

## 2010-08-18 LAB — CBC
HCT: 34.4 % — ABNORMAL LOW (ref 36.0–46.0)
HCT: 34.1 % — ABNORMAL LOW (ref 36.0–46.0)
Hemoglobin: 11.4 g/dL — ABNORMAL LOW (ref 12.0–15.0)
Hemoglobin: 11.6 g/dL — ABNORMAL LOW (ref 12.0–15.0)
MCH: 29.8 pg (ref 26.0–34.0)
MCH: 30.8 pg (ref 26.0–34.0)
MCHC: 33.1 g/dL (ref 30.0–36.0)
MCV: 90.6 fL (ref 78.0–100.0)
RBC: 3.77 MIL/uL — ABNORMAL LOW (ref 3.87–5.11)

## 2010-08-18 LAB — DIFFERENTIAL
Basophils Absolute: 0 10*3/uL (ref 0.0–0.1)
Eosinophils Absolute: 0.1 10*3/uL (ref 0.0–0.7)
Eosinophils Relative: 1 % (ref 0–5)
Lymphocytes Relative: 6 % — ABNORMAL LOW (ref 12–46)

## 2010-08-18 LAB — BASIC METABOLIC PANEL
BUN: 3 mg/dL — ABNORMAL LOW (ref 6–23)
CO2: 22 mEq/L (ref 19–32)
Glucose, Bld: 130 mg/dL — ABNORMAL HIGH (ref 70–99)
Potassium: 3.3 mEq/L — ABNORMAL LOW (ref 3.5–5.1)
Sodium: 138 mEq/L (ref 135–145)

## 2010-08-18 LAB — COMPREHENSIVE METABOLIC PANEL
AST: 17 U/L (ref 0–37)
CO2: 22 mEq/L (ref 19–32)
Chloride: 106 mEq/L (ref 96–112)
Creatinine, Ser: 0.53 mg/dL (ref 0.4–1.2)
GFR calc Af Amer: >60 mL/min (ref >60)
GFR calc non Af Amer: >60 mL/min (ref >60)
Total Bilirubin: 0.5 mg/dL (ref 0.3–1.2)

## 2010-08-18 LAB — URINALYSIS, ROUTINE W REFLEX MICROSCOPIC
Bilirubin Urine: NEGATIVE
Glucose, UA: NEGATIVE mg/dL
Hgb urine dipstick: NEGATIVE
Nitrite: NEGATIVE
Specific Gravity, Urine: 1.018 (ref 1.005–1.030)
pH: 7.5 (ref 5.0–8.0)

## 2010-08-18 LAB — CULTURE, BLOOD (ROUTINE X 2)
Culture  Setup Time: 201111091057
Culture: NO GROWTH

## 2010-08-18 LAB — INFLUENZA PANEL BY PCR (TYPE A & B): H1N1 flu by pcr: NOT DETECTED

## 2010-08-18 LAB — LACTIC ACID, PLASMA: Lactic Acid, Venous: 1 mmol/L (ref 0.5–2.2)

## 2010-08-19 ENCOUNTER — Ambulatory Visit (INDEPENDENT_AMBULATORY_CARE_PROVIDER_SITE_OTHER): Payer: Medicaid Other | Admitting: Family Medicine

## 2010-08-19 ENCOUNTER — Encounter: Payer: Self-pay | Admitting: Family Medicine

## 2010-08-19 DIAGNOSIS — C50919 Malignant neoplasm of unspecified site of unspecified female breast: Secondary | ICD-10-CM | POA: Insufficient documentation

## 2010-08-19 DIAGNOSIS — K529 Noninfective gastroenteritis and colitis, unspecified: Secondary | ICD-10-CM

## 2010-08-19 DIAGNOSIS — K5289 Other specified noninfective gastroenteritis and colitis: Secondary | ICD-10-CM

## 2010-08-19 NOTE — Assessment & Plan Note (Signed)
I have recommended small amounts clear fluids frequently, soups, juices, water and advance diet as tolerated.  Zofran for nausea, already has some at home from chemotherapy.  Return office visit if symptoms persist or worsen; I have alerted the patient to call if high fever, dehydration, marked weakness, fainting, increased abdominal pain, blood in stool or vomit.

## 2010-08-19 NOTE — Patient Instructions (Signed)
Nice to see you today.  For your nausea you may take your zofran (odansetron) you already have at home.  I would like to see you back at my next available appointment to do your physical when you are feeling better.  If your vomiting or diarrhea worsen, abdominal pain worsens, you develop fever or have blood in your stool call our office or go to the ED.  Hope you continue to feel better.  Have a nice day

## 2010-08-19 NOTE — Progress Notes (Signed)
  Subjective:    Patient ID: Karen Hopkins, female    DOB: [birth year], 35 y.o.   MRN: [id number]  HPI Visit originally for CPE, however would like to defer until feeling better since she currently is getting over a GI virus.  States she had vomiting and diarrhea that started a few days ago.  Vomiting was non bloody, non-bilious.   Denies blood in her diarrhea.  This has been getting better over the past few days.  She still has some abdominal pain and cramping mainly in her upper abdomen.    She has had no sick contacts.  She is due for her herceptin on Friday 3/16.    Review of Systems  SEE HPI   Objective:   Physical Exam  Constitutional: She appears well-developed. No distress.       Obese   HENT:  Head: Normocephalic and atraumatic.  Cardiovascular: Normal rate and regular rhythm.   Pulmonary/Chest: Breath sounds normal. No respiratory distress.  Abdominal: Soft. Bowel sounds are normal. She exhibits no distension. There is tenderness. There is no rebound and no guarding.       Tender in epigastric area.    Skin: Skin is warm and dry.          Assessment & Plan:

## 2010-08-20 LAB — URINALYSIS, ROUTINE W REFLEX MICROSCOPIC
Bilirubin Urine: NEGATIVE
Glucose, UA: NEGATIVE mg/dL
Hgb urine dipstick: NEGATIVE
Ketones, ur: NEGATIVE mg/dL
Protein, ur: NEGATIVE mg/dL

## 2010-08-21 ENCOUNTER — Encounter (HOSPITAL_BASED_OUTPATIENT_CLINIC_OR_DEPARTMENT_OTHER): Payer: Medicaid Other | Admitting: Oncology

## 2010-08-21 DIAGNOSIS — Z170 Estrogen receptor positive status [ER+]: Secondary | ICD-10-CM

## 2010-08-21 DIAGNOSIS — Z5112 Encounter for antineoplastic immunotherapy: Secondary | ICD-10-CM

## 2010-08-21 DIAGNOSIS — C50419 Malignant neoplasm of upper-outer quadrant of unspecified female breast: Secondary | ICD-10-CM

## 2010-08-24 LAB — BASIC METABOLIC PANEL
BUN: 5 mg/dL — ABNORMAL LOW (ref 6–23)
CO2: 26 mEq/L (ref 19–32)
Chloride: 104 mEq/L (ref 96–112)
Creatinine, Ser: 0.62 mg/dL (ref 0.4–1.2)
Glucose, Bld: 100 mg/dL — ABNORMAL HIGH (ref 70–99)
Potassium: 4.1 mEq/L (ref 3.5–5.1)

## 2010-08-24 LAB — COMPREHENSIVE METABOLIC PANEL
CO2: 22 mEq/L (ref 19–32)
Calcium: 9.1 mg/dL (ref 8.4–10.5)
Creatinine, Ser: 0.55 mg/dL (ref 0.4–1.2)
GFR calc Af Amer: >60 mL/min (ref >60)
GFR calc non Af Amer: >60 mL/min (ref >60)
Glucose, Bld: 91 mg/dL (ref 70–99)

## 2010-08-24 LAB — CBC
HCT: 34.1 % — ABNORMAL LOW (ref 36.0–46.0)
HCT: 36.5 % (ref 36.0–46.0)
MCHC: 33.2 g/dL (ref 30.0–36.0)
MCHC: 33.4 g/dL (ref 30.0–36.0)
MCV: 91.9 fL (ref 78.0–100.0)
MCV: 92.8 fL (ref 78.0–100.0)
Platelets: 184 10*3/uL (ref 150–400)
RBC: 3.97 MIL/uL (ref 3.87–5.11)
WBC: 4.6 10*3/uL (ref 4.0–10.5)
WBC: 5.2 10*3/uL (ref 4.0–10.5)

## 2010-08-24 LAB — DIFFERENTIAL
Basophils Absolute: 0 10*3/uL (ref 0.0–0.1)
Basophils Absolute: 0 10*3/uL (ref 0.0–0.1)
Eosinophils Absolute: 0 10*3/uL (ref 0.0–0.7)
Lymphocytes Relative: 23 % (ref 12–46)
Lymphs Abs: 0.7 10*3/uL (ref 0.7–4.0)
Monocytes Absolute: 0.2 10*3/uL (ref 0.1–1.0)
Monocytes Relative: 4 % (ref 3–12)
Neutro Abs: 4.3 10*3/uL (ref 1.7–7.7)
Neutrophils Relative %: 65 % (ref 43–77)
WBC Morphology: INCREASED

## 2010-08-24 LAB — URINALYSIS, ROUTINE W REFLEX MICROSCOPIC
Protein, ur: NEGATIVE mg/dL
Urobilinogen, UA: 1 mg/dL (ref 0.0–1.0)

## 2010-08-24 LAB — LIPASE, BLOOD: Lipase: 19 U/L (ref 11–59)

## 2010-08-24 LAB — CULTURE, RESPIRATORY W GRAM STAIN: Culture: NORMAL

## 2010-08-26 LAB — POCT HEMOGLOBIN-HEMACUE: Hemoglobin: 13.6 g/dL (ref 12.0–15.0)

## 2010-09-07 ENCOUNTER — Ambulatory Visit (INDEPENDENT_AMBULATORY_CARE_PROVIDER_SITE_OTHER): Payer: Medicaid Other | Admitting: Family Medicine

## 2010-09-07 ENCOUNTER — Encounter: Payer: Self-pay | Admitting: Family Medicine

## 2010-09-07 ENCOUNTER — Other Ambulatory Visit (HOSPITAL_COMMUNITY)
Admission: RE | Admit: 2010-09-07 | Discharge: 2010-09-07 | Disposition: A | Discharge status: Home / Self Care | Payer: Medicaid Other | Source: Ambulatory Visit | Attending: Family Medicine | Admitting: Family Medicine

## 2010-09-07 VITALS — BP 139/86 | HR 108 | Temp 98.1°F | Wt 253.0 lb

## 2010-09-07 DIAGNOSIS — M722 Plantar fascial fibromatosis: Secondary | ICD-10-CM

## 2010-09-07 DIAGNOSIS — Z0000 Encounter for general adult medical examination without abnormal findings: Secondary | ICD-10-CM

## 2010-09-07 DIAGNOSIS — Z01419 Encounter for gynecological examination (general) (routine) without abnormal findings: Secondary | ICD-10-CM | POA: Insufficient documentation

## 2010-09-07 DIAGNOSIS — Z124 Encounter for screening for malignant neoplasm of cervix: Secondary | ICD-10-CM

## 2010-09-07 NOTE — Patient Instructions (Signed)
It was nice seeing you today.  Please ask Dr. Darnelle Catalan if it is ok to use anti-inflammatories (advil, aleve, etc.)  with your current treatment.  You can try a  Night time plantar fasciitis brace if you want to see if this helps.   I will let you know the results of your pap smear if it abnormal. If you have questions please feel free to call our office.   Plantar Fasciitis  Plantar fasciitis is a common condition that causes foot pain. It is soreness (inflammation) of the band of tough fibrous tissue on the bottom of the foot that runs from the heel bone (calcaneus) to the ball of the foot. The cause of this soreness may be from excessive standing, poor fitting shoes, running on hard surfaces, being overweight, having an abnormal walk, or overuse (this is common in runners) of the painful foot or feet. It is also common in aerobic exercise dancers and ballet dancers.  SYMPTOMS Most people with plantar fasciitis complain of:  Severe pain in the morning on the bottom of their foot especially when taking the first steps out of bed. This pain recedes after a few minutes of walking.   Severe pain is experienced also during walking following a long period of inactivity.   Pain is worse when walking barefoot or up stairs  DIAGNOSIS  Your caregiver will diagnose this condition by examining and feeling your foot.   Special tests such as x-rays of your foot, are usually not needed.  PREVENTION  Consult a sports medicine professional before beginning a new exercise program.   Walking programs offer a good workout. With walking there is a lower chance of overuse injuries common to runners. There is less impact and less jarring of the joints.   Begin all new exercise programs slowly. If problems or pain develop, decrease the amount of time or distance until you are at a comfortable level.   Wear good shoes and replace them regularly.   Stretch your foot and the heel cords at the back of the ankle  (Achilles tendon) both before and after exercise.   Run or exercise on even surfaces that are not hard. For example, asphalt is better than pavement.   Do not run barefoot on hard surfaces.   If using a treadmill, vary the incline.   Do not continue to workout if you have foot or joint problems. Seek professional help if they do not improve.  HOME CARE INSTRUCTIONS  Avoid activities that cause you pain until you recover.   Use ice or cold packs on the problem or painful areas after working out.   Only take over-the-counter or prescription medicines for pain, discomfort, or fever as directed by your caregiver.   Soft shoe inserts or athletic shoes with air or gel sole cushions may be helpful.   If problems continue or become more severe, consult a sports medicine caregiver or your own health care provider. Cortisone is a potent anti-inflammatory medication that may be injected into the painful area. You can discuss this treatment with your caregiver.  MAKE SURE YOU:  Understand these instructions.   Will watch your condition.   Will get help right away if you are not doing well or get worse.  Document Released: 02/16/2001 Document Re-Released: 08/18/2009 [facility] Patient Information 2011 [place], [place].

## 2010-09-08 ENCOUNTER — Ambulatory Visit (HOSPITAL_COMMUNITY)
Admission: RE | Admit: 2010-09-08 | Discharge: 2010-09-08 | Disposition: A | Discharge status: Home / Self Care | Payer: Medicaid Other | Source: Ambulatory Visit | Attending: Oncology | Admitting: Oncology

## 2010-09-08 DIAGNOSIS — I059 Rheumatic mitral valve disease, unspecified: Secondary | ICD-10-CM | POA: Insufficient documentation

## 2010-09-09 ENCOUNTER — Encounter: Payer: Self-pay | Admitting: Family Medicine

## 2010-09-10 ENCOUNTER — Other Ambulatory Visit: Payer: Self-pay | Admitting: Oncology

## 2010-09-10 ENCOUNTER — Encounter (HOSPITAL_BASED_OUTPATIENT_CLINIC_OR_DEPARTMENT_OTHER): Payer: Medicaid Other | Admitting: Oncology

## 2010-09-10 DIAGNOSIS — Z170 Estrogen receptor positive status [ER+]: Secondary | ICD-10-CM

## 2010-09-10 DIAGNOSIS — Z9010 Acquired absence of unspecified breast and nipple: Secondary | ICD-10-CM

## 2010-09-10 DIAGNOSIS — C50419 Malignant neoplasm of upper-outer quadrant of unspecified female breast: Secondary | ICD-10-CM

## 2010-09-10 DIAGNOSIS — Z5112 Encounter for antineoplastic immunotherapy: Secondary | ICD-10-CM

## 2010-09-10 DIAGNOSIS — Z23 Encounter for immunization: Secondary | ICD-10-CM

## 2010-09-10 LAB — COMPREHENSIVE METABOLIC PANEL
CO2: 22 mEq/L (ref 19–32)
Calcium: 8.8 mg/dL (ref 8.4–10.5)
Glucose, Bld: 123 mg/dL — ABNORMAL HIGH (ref 70–99)
Potassium: 3.9 mEq/L (ref 3.5–5.3)
Sodium: 140 mEq/L (ref 135–145)
Total Bilirubin: 0.2 mg/dL — ABNORMAL LOW (ref 0.3–1.2)

## 2010-09-10 LAB — CBC WITH DIFFERENTIAL/PLATELET
Basophils Absolute: 0 10*3/uL (ref 0.0–0.1)
Eosinophils Absolute: 0.7 10*3/uL — ABNORMAL HIGH (ref 0.0–0.5)
HCT: 34.1 % — ABNORMAL LOW (ref 34.8–46.6)
HGB: 11.5 g/dL — ABNORMAL LOW (ref 11.6–15.9)
LYMPH%: 21 % (ref 14.0–49.7)
MONO#: 0.4 10*3/uL (ref 0.1–0.9)
NEUT#: 4.2 10*3/uL (ref 1.5–6.5)
Platelets: 241 10*3/uL (ref 145–400)
RBC: 3.78 10*6/uL (ref 3.70–5.45)
WBC: 6.8 10*3/uL (ref 3.9–10.3)

## 2010-09-11 ENCOUNTER — Encounter (HOSPITAL_BASED_OUTPATIENT_CLINIC_OR_DEPARTMENT_OTHER): Payer: Medicaid Other | Admitting: Oncology

## 2010-09-11 DIAGNOSIS — Z170 Estrogen receptor positive status [ER+]: Secondary | ICD-10-CM

## 2010-09-11 DIAGNOSIS — Z5112 Encounter for antineoplastic immunotherapy: Secondary | ICD-10-CM

## 2010-09-11 DIAGNOSIS — C50419 Malignant neoplasm of upper-outer quadrant of unspecified female breast: Secondary | ICD-10-CM

## 2010-09-11 DIAGNOSIS — Z452 Encounter for adjustment and management of vascular access device: Secondary | ICD-10-CM

## 2010-09-13 DIAGNOSIS — M722 Plantar fascial fibromatosis: Secondary | ICD-10-CM | POA: Insufficient documentation

## 2010-09-13 DIAGNOSIS — Z0000 Encounter for general adult medical examination without abnormal findings: Secondary | ICD-10-CM | POA: Insufficient documentation

## 2010-09-13 NOTE — Progress Notes (Signed)
  Subjective:    Patient ID: Karen Hopkins, female    DOB: April 18, 1976, 35 y.o.   MRN: [id number]  HPI Returns today for CPE w/ pap.  Since last visit feeling much better, still with some occasional nasal stuffiness but denies nausea, vomiting or diarrhea.  Currently being treated for breast cancer with Dr. Darnelle Catalan.  Complaining of foot pain today.  Pain is worse in the am when she first gets out of bed.  Pain is on bottom of feet towards heel.  Pain improves after walking around for a while.  Not trying anything for the pain at this point.   Points to area around the calcalneal tuberosity as the source of her pain.   Review of Systems  Constitutional: Negative for fever, chills and fatigue.  HENT: Positive for congestion and rhinorrhea.   Eyes: Negative for pain and itching.  Respiratory: Negative for shortness of breath.   Cardiovascular: Negative for chest pain.  Gastrointestinal: Negative for nausea, vomiting, diarrhea and abdominal distention.  Genitourinary: Negative for dysuria.  Musculoskeletal: Negative.   Skin: Negative.   Neurological: Negative.        Objective:   Physical Exam  Constitutional: She appears well-developed and well-nourished. No distress.  HENT:  Head: Normocephalic and atraumatic.  Right Ear: Tympanic membrane, external ear and ear canal normal.  Left Ear: Tympanic membrane, external ear and ear canal normal.  Nose: Mucosal edema present. Right sinus exhibits no maxillary sinus tenderness and no frontal sinus tenderness. Left sinus exhibits no maxillary sinus tenderness and no frontal sinus tenderness.  Mouth/Throat: Oropharynx is clear and moist and mucous membranes are normal.  Eyes: Conjunctivae are normal. Pupils are equal, round, and reactive to light.  Neck: Neck supple. No mass and no thyromegaly present.  Cardiovascular: Normal rate, regular rhythm and normal heart sounds.   Pulmonary/Chest: Effort normal and breath sounds normal. Left breast  exhibits no inverted nipple, no mass, no nipple discharge, no skin change and no tenderness.       R side s/p mastectomy  Abdominal: Normal appearance and bowel sounds are normal. There is no tenderness.  Genitourinary: Vagina normal and uterus normal. Cervix exhibits no motion tenderness and no friability. Right adnexum displays no tenderness. Left adnexum displays no tenderness.  Musculoskeletal:       Right foot: She exhibits tenderness.       Tenderness along the plantar fascia at the calcaneal tuberosity.    Lymphadenopathy:       Head (right side): No submental, no submandibular, no tonsillar, no preauricular, no posterior auricular and no occipital adenopathy present.       Head (left side): No submental, no submandibular, no tonsillar, no preauricular, no posterior auricular and no occipital adenopathy present.       Right cervical: No superficial cervical, no deep cervical and no posterior cervical adenopathy present.      Left cervical: No superficial cervical, no deep cervical and no posterior cervical adenopathy present.       Right axillary: No pectoral and no lateral adenopathy present.       Left axillary: No pectoral and no lateral adenopathy present. Neurological: She is alert.  Skin: Skin is warm, dry and intact.  Psychiatric: She has a normal mood and affect. Her speech is normal.

## 2010-09-13 NOTE — Assessment & Plan Note (Signed)
Doing well, still following with Dr. Darnelle Catalan for breast cancer treatment, treatments are going well per patient.  Has had recent chemistry and CBC with Dr. Darnelle Catalan, no need to re-check anything today.  Pap done today, will let patient know results when they return.

## 2010-09-13 NOTE — Assessment & Plan Note (Addendum)
Foot pain sounds like plantar fasciitis.  Given instructions for stretches and bracing at night.  Explained that could be injected but wants conservative tx at this time.  Told to ask Dr. Darnelle Catalan about taking NSAIDS with currenty chemo regmien.

## 2010-09-14 LAB — CBC
HCT: 32.3 % — ABNORMAL LOW (ref 36.0–46.0)
Hemoglobin: 11 g/dL — ABNORMAL LOW (ref 12.0–15.0)
Hemoglobin: 12.7 g/dL (ref 12.0–15.0)
MCHC: 34.2 g/dL (ref 30.0–36.0)
MCV: 91.5 fL (ref 78.0–100.0)
Platelets: 140 10*3/uL — ABNORMAL LOW (ref 150–400)
RBC: 3.53 MIL/uL — ABNORMAL LOW (ref 3.87–5.11)
RBC: 4.12 MIL/uL (ref 3.87–5.11)
WBC: 10.7 10*3/uL — ABNORMAL HIGH (ref 4.0–10.5)
WBC: 9.6 10*3/uL (ref 4.0–10.5)

## 2010-09-14 LAB — CROSSMATCH

## 2010-09-14 LAB — POCT URINALYSIS DIP (DEVICE)
Hgb urine dipstick: NEGATIVE
Hgb urine dipstick: NEGATIVE
Nitrite: NEGATIVE
Protein, ur: 30 mg/dL — AB
Specific Gravity, Urine: 1.01 (ref 1.005–1.030)
Specific Gravity, Urine: 1.02 (ref 1.005–1.030)
Urobilinogen, UA: 0.2 mg/dL (ref 0.0–1.0)
Urobilinogen, UA: 0.2 mg/dL (ref 0.0–1.0)
pH: 6 (ref 5.0–8.0)
pH: 6.5 (ref 5.0–8.0)

## 2010-09-15 ENCOUNTER — Ambulatory Visit
Admission: RE | Admit: 2010-09-15 | Discharge: 2010-09-15 | Disposition: A | Discharge status: Home / Self Care | Payer: Medicaid Other | Source: Ambulatory Visit | Attending: Oncology | Admitting: Oncology

## 2010-09-15 DIAGNOSIS — Z9010 Acquired absence of unspecified breast and nipple: Secondary | ICD-10-CM

## 2010-09-15 LAB — CBC
HCT: 36.5 % (ref 36.0–46.0)
MCHC: 33.9 g/dL (ref 30.0–36.0)
Platelets: 181 10*3/uL (ref 150–400)
RDW: 14.8 % (ref 11.5–15.5)

## 2010-09-15 LAB — POCT URINALYSIS DIP (DEVICE)
Bilirubin Urine: NEGATIVE
Glucose, UA: NEGATIVE mg/dL
Ketones, ur: NEGATIVE mg/dL
Nitrite: NEGATIVE
Protein, ur: NEGATIVE mg/dL
Protein, ur: NEGATIVE mg/dL
Specific Gravity, Urine: 1.02 (ref 1.005–1.030)
pH: 6 (ref 5.0–8.0)

## 2010-09-15 LAB — COMPREHENSIVE METABOLIC PANEL
Albumin: 2.7 g/dL — ABNORMAL LOW (ref 3.5–5.2)
Alkaline Phosphatase: 123 U/L — ABNORMAL HIGH (ref 39–117)
BUN: 2 mg/dL — ABNORMAL LOW (ref 6–23)
Calcium: 8.6 mg/dL (ref 8.4–10.5)
Glucose, Bld: 81 mg/dL (ref 70–99)
Potassium: 3.2 mEq/L — ABNORMAL LOW (ref 3.5–5.1)
Sodium: 131 mEq/L — ABNORMAL LOW (ref 135–145)
Total Protein: 6.1 g/dL (ref 6.0–8.3)

## 2010-09-15 LAB — URINALYSIS, ROUTINE W REFLEX MICROSCOPIC
Glucose, UA: NEGATIVE mg/dL
Ketones, ur: NEGATIVE mg/dL
Nitrite: NEGATIVE
Specific Gravity, Urine: 1.01 (ref 1.005–1.030)
pH: 6 (ref 5.0–8.0)

## 2010-09-15 LAB — URIC ACID: Uric Acid, Serum: 3.5 mg/dL (ref 2.4–7.0)

## 2010-09-16 LAB — GLUCOSE, CAPILLARY: Glucose-Capillary: 96 mg/dL (ref 70–99)

## 2010-09-17 LAB — GLUCOSE, CAPILLARY
Glucose-Capillary: 91 mg/dL (ref 70–99)
Glucose-Capillary: 154 mg/dL — ABNORMAL HIGH (ref 70–99)

## 2010-09-22 ENCOUNTER — Encounter: Payer: Medicaid Other | Admitting: Oncology

## 2010-09-23 ENCOUNTER — Ambulatory Visit: Payer: Medicaid Other | Attending: Oncology | Admitting: Physical Therapy

## 2010-09-23 DIAGNOSIS — IMO0001 Reserved for inherently not codable concepts without codable children: Secondary | ICD-10-CM | POA: Insufficient documentation

## 2010-09-23 DIAGNOSIS — M25619 Stiffness of unspecified shoulder, not elsewhere classified: Secondary | ICD-10-CM | POA: Insufficient documentation

## 2010-09-23 DIAGNOSIS — R0789 Other chest pain: Secondary | ICD-10-CM | POA: Insufficient documentation

## 2010-09-23 DIAGNOSIS — M79609 Pain in unspecified limb: Secondary | ICD-10-CM | POA: Insufficient documentation

## 2010-09-25 ENCOUNTER — Ambulatory Visit: Payer: Medicaid Other | Admitting: Family Medicine

## 2010-10-01 ENCOUNTER — Ambulatory Visit: Payer: Medicaid Other | Admitting: Physical Therapy

## 2010-10-02 ENCOUNTER — Encounter (HOSPITAL_BASED_OUTPATIENT_CLINIC_OR_DEPARTMENT_OTHER): Payer: Medicaid Other | Admitting: Oncology

## 2010-10-02 DIAGNOSIS — Z5112 Encounter for antineoplastic immunotherapy: Secondary | ICD-10-CM

## 2010-10-02 DIAGNOSIS — C50419 Malignant neoplasm of upper-outer quadrant of unspecified female breast: Secondary | ICD-10-CM

## 2010-10-02 DIAGNOSIS — Z170 Estrogen receptor positive status [ER+]: Secondary | ICD-10-CM

## 2010-10-05 ENCOUNTER — Ambulatory Visit: Payer: Medicaid Other | Admitting: Physical Therapy

## 2010-10-07 ENCOUNTER — Ambulatory Visit: Payer: Medicaid Other | Attending: Oncology | Admitting: Physical Therapy

## 2010-10-07 DIAGNOSIS — M79609 Pain in unspecified limb: Secondary | ICD-10-CM | POA: Insufficient documentation

## 2010-10-07 DIAGNOSIS — IMO0001 Reserved for inherently not codable concepts without codable children: Secondary | ICD-10-CM | POA: Insufficient documentation

## 2010-10-07 DIAGNOSIS — M25619 Stiffness of unspecified shoulder, not elsewhere classified: Secondary | ICD-10-CM | POA: Insufficient documentation

## 2010-10-07 DIAGNOSIS — R0789 Other chest pain: Secondary | ICD-10-CM | POA: Insufficient documentation

## 2010-10-12 ENCOUNTER — Ambulatory Visit: Payer: Medicaid Other | Admitting: Physical Therapy

## 2010-10-14 ENCOUNTER — Ambulatory Visit: Payer: Medicaid Other | Admitting: Physical Therapy

## 2010-10-14 ENCOUNTER — Ambulatory Visit (HOSPITAL_COMMUNITY)
Admission: RE | Admit: 2010-10-14 | Discharge: 2010-10-14 | Disposition: A | Discharge status: Home / Self Care | Payer: Medicaid Other | Source: Ambulatory Visit | Attending: Family Medicine | Admitting: Family Medicine

## 2010-10-14 DIAGNOSIS — M79609 Pain in unspecified limb: Secondary | ICD-10-CM | POA: Insufficient documentation

## 2010-10-14 DIAGNOSIS — M7989 Other specified soft tissue disorders: Secondary | ICD-10-CM | POA: Insufficient documentation

## 2010-10-14 DIAGNOSIS — Z9010 Acquired absence of unspecified breast and nipple: Secondary | ICD-10-CM | POA: Insufficient documentation

## 2010-10-19 ENCOUNTER — Encounter: Payer: Medicaid Other | Admitting: Physical Therapy

## 2010-10-20 NOTE — Discharge Summary (Signed)
[name], Karen Hopkins NO.:  [id number]   MEDICAL RECORD NO.:  [id number]          PATIENT TYPE:  INP   LOCATION:  9125                          FACILITY:  [facility]   PHYSICIAN:  Horton Chin, MD DATE OF BIRTH:  Sep 06, 1975   DATE OF ADMISSION:  11/21/2008  DATE OF DISCHARGE:  11/24/2008                               DISCHARGE SUMMARY   DISCHARGE DIAGNOSES:  1. Delivery of healthy baby boy, status post repeat cesarean section.  2. Bilateral tubal ligation.   PROCEDURES:  1. Repeat low transverse cesarean section.  2. Bilateral tubal ligation on November 21, 2008.   DISCHARGE MEDICATIONS:  1. Motrin 600 mg 1 p.o. q.6 h.  2. Percocet 5/325 mg 1 p.o. q.4-6 h. p.r.n. breakthrough pain.  3. Colace 100 mg 1 p.o. daily.   LABORATORY DATA:  Admission hemoglobin 12.7 and discharge hemoglobin  11.0.  RPR nonreactive.  The patient is B positive, rubella immune,  hepatitis B negative, GBS negative, and HIV nonreactive.   BRIEF HOSPITAL COURSE:  For full summary, please see hospital notes and  operative report.  In short, the patient is a 35 year old G3, P2-1-0-3,  who delivered a viable healthy female infant via C-section with Apgars of  7 and 9 at 1 and 5 minutes respectively with an arterial cord pH of  7.22.  Baby weighed 8 pounds 4 ounces.  Placenta was intact with three-  vessel cord.  The patient also underwent concurrent bilateral tubal  ligation.  Repeat cesarean was done because she had had 2 previous  cesareans.  The patient had an uncomplicated postpartum course and was  discharged to home in stable condition.  Prior to discharge, she was  ambulating and voiding well with decreased lochia.  The patient was  instructed to refrain from sexual activity for 6 weeks and not to lift  heavy materials for 6 weeks.  The patient desires to bottle feed her  baby, and  the baby will have an outpatient circumcision in the coming  week.      Eustaquio Boyden, MD      Horton Chin, MD  Electronically Signed    JG/MEDQ  D:  11/24/2008  T:  11/24/2008  Job:  [id number]

## 2010-10-20 NOTE — Op Note (Signed)
Karen Hopkins, Karen Hopkins NO.:  [id number]   MEDICAL RECORD NO.:  [id number]          PATIENT TYPE:  INP   LOCATION:  9125                          FACILITY:  [facility]   PHYSICIAN:  Horton Chin, MD DATE OF BIRTH:  Oct 16, 1975   DATE OF PROCEDURE:  11/21/2008  DATE OF DISCHARGE:                               OPERATIVE REPORT   PREOPERATIVE DIAGNOSES:  1. Intrauterine pregnancy at 43 weeks' gestation.  2. Prior cesarean section x2.  3. Undesired fertility.   POSTOPERATIVE DIAGNOSES:  1. Intrauterine pregnancy at 82 weeks' gestation.  2. Prior cesarean section x2.  3. Undesired fertility.   PROCEDURE:  Repeat low transverse cesarean section and bilateral tubal  sterilization using Filshie clips.   SURGEON:  Horton Chin, MD   ASSISTANT:  Eustaquio Boyden, MD   ANESTHESIA:  Spinal.   ANESTHESIOLOGIST:  Dr. Malen Gauze.   INDICATIONS:  The patient is a 35 year old, gravida 3, para 1-1-0-2 at  4 weeks with history of 2 prior cesarean sections and undesired  fertility, here for scheduled repeat cesarean section and bilateral  tubal sterilization.  Prior to surgery, the risks of surgery including  but not limited to bleeding which might require transfusion, infection  which might require antibiotics, injury to surrounding organs, need for  additional procedures, risk of failure of tubal sterilization of 1:200  with increased risk of ectopic gestation if pregnancy does occur were  discussed with the patient and informed consent was obtained.   FINDINGS:  Viable female infant in cephalic presentation.  Apgars were 7  and 9, weight 8 pounds 4 ounces.  Arterial cord pH was 7.22, clear  amniotic fluid, intact placenta with three-vessel cord.  Normal uterus  and bilateral adnexa.   IV FLUIDS:  2800 mL of lactated Ringers.   ESTIMATED BLOOD LOSS:  800 mL.   URINE OUTPUT:  200 mL.   SPECIMENS:  Placenta sent to Labor and Delivery.   COMPLICATIONS:  None  immediate.   PROCEDURE IN DETAILS:  The patient received preoperative intravenous  gentamicin and clindamycin and had sequential compression devices  applied to her lower extremities while in the preoperative area.  She  was then taken to the operating room where spinal analgesia was  administered and found to be adequate.  The patient was then placed in  the dorsal supine with a leftward tilt, and prepped and draped in a  sterile manner.  A Pfannenstiel incision was made over her preexisting  scar and taken through to the underlying layer of fascia using the  scalpel.  The scalpel was used to make incision in the middle of the  fascia, and this incision was extended bilaterally using Mayo scissors.  Kochers were applied to the superior aspect of this fascial incision,  and underlying rectus muscles were dissected off bluntly and sharply.  A  similar process was carried out on inferior aspect.  The rectus muscles  were separated in the midline bluntly and the peritoneum was entered  bluntly.  This peritoneal incision was extended in a sharp fashion  superiorly and inferiorly with good visualization  of the bowel and  bladder.  There were minimal adhesions that were noted at this point.  In order to make adequate room for the delivery, the rectus muscles on  both sides had to be transected about 1-cm horizontally.  Attention was  then turned to lower uterine segment where a transverse hysterotomy was  made with a scalpel and extended bilaterally.  The infant's head was  encountered.  There was some difficulty in delivering the infant's head,  so a vacuum was used for assistance, the infant delivered  atraumatically.  There was a nuchal cord x1 that was easily reduced, and  the rest of the infant was delivered without complication.  The cord was  clamped and cut, and the infant was handed over to the awaiting  neonatologists.  Cord blood was collected as per protocol.  Fundal  massage was  administered and the placenta delivered intact with 3-vessel  cord.  The uterus was cleared of all clot and debris.  The hysterotomy  was repaired using 0 Monocryl in a running interlocking fashion.  A  second layer of 0 Monocryl was used for an imbricating layer.  Good  hemostasis was confirmed.  At this point, attention was turned to  bilateral fallopian tubes where Filshie clip was placed about 3 cm from  the cornua in the mid isthmic region of both tubes with care given to  incorporate the underlying mesosalpinx on both sides.  This allowed for  bilateral tubal sterilization.  Good hemostasis was noted.  The pelvis  and the gutters were then cleared of all clot and debris.  There was  some bleeding on the rectus bed which was controlled using  electrocautery.  The peritoneum was unable to be reapproximated, so the  decision was made to proceed with fascial closure which was done with 0  PDS in a running fashion.  The subcutaneous layer was copiously  irrigated and then some bleeders were controlled using electrocautery  and the skin incision was closed with staples.  The patient tolerated  the procedure well.  Sponge, instrument, and needle counts were correct  x3.  She was taken to the recovery room in stable condition.      Horton Chin, MD  Electronically Signed     UAA/MEDQ  D:  11/21/2008  T:  11/22/2008  Job:  [id number]

## 2010-10-21 ENCOUNTER — Encounter (INDEPENDENT_AMBULATORY_CARE_PROVIDER_SITE_OTHER): Payer: Self-pay | Admitting: Surgery

## 2010-10-21 ENCOUNTER — Ambulatory Visit: Payer: Medicaid Other | Admitting: Physical Therapy

## 2010-10-23 ENCOUNTER — Encounter (HOSPITAL_BASED_OUTPATIENT_CLINIC_OR_DEPARTMENT_OTHER): Payer: Medicaid Other | Admitting: Oncology

## 2010-10-23 DIAGNOSIS — Z5112 Encounter for antineoplastic immunotherapy: Secondary | ICD-10-CM

## 2010-10-23 DIAGNOSIS — C50419 Malignant neoplasm of upper-outer quadrant of unspecified female breast: Secondary | ICD-10-CM

## 2010-10-23 DIAGNOSIS — Z170 Estrogen receptor positive status [ER+]: Secondary | ICD-10-CM

## 2010-10-26 ENCOUNTER — Ambulatory Visit: Payer: Medicaid Other | Admitting: Physical Therapy

## 2010-10-28 ENCOUNTER — Ambulatory Visit: Payer: Medicaid Other | Admitting: Physical Therapy

## 2010-10-28 ENCOUNTER — Encounter: Payer: Medicaid Other | Admitting: Physical Therapy

## 2010-10-29 ENCOUNTER — Ambulatory Visit: Payer: Medicaid Other | Admitting: Physical Therapy

## 2010-11-04 ENCOUNTER — Ambulatory Visit: Payer: Medicaid Other | Admitting: Physical Therapy

## 2010-11-05 ENCOUNTER — Ambulatory Visit: Payer: Medicaid Other | Admitting: Physical Therapy

## 2010-11-06 ENCOUNTER — Ambulatory Visit: Payer: Medicaid Other | Attending: Oncology | Admitting: Physical Therapy

## 2010-11-06 DIAGNOSIS — M25619 Stiffness of unspecified shoulder, not elsewhere classified: Secondary | ICD-10-CM | POA: Insufficient documentation

## 2010-11-06 DIAGNOSIS — R0789 Other chest pain: Secondary | ICD-10-CM | POA: Insufficient documentation

## 2010-11-06 DIAGNOSIS — M79609 Pain in unspecified limb: Secondary | ICD-10-CM | POA: Insufficient documentation

## 2010-11-06 DIAGNOSIS — IMO0001 Reserved for inherently not codable concepts without codable children: Secondary | ICD-10-CM | POA: Insufficient documentation

## 2010-11-09 ENCOUNTER — Ambulatory Visit: Payer: Medicaid Other | Admitting: Physical Therapy

## 2010-11-11 ENCOUNTER — Ambulatory Visit: Payer: Medicaid Other | Admitting: Physical Therapy

## 2010-11-13 ENCOUNTER — Encounter (HOSPITAL_BASED_OUTPATIENT_CLINIC_OR_DEPARTMENT_OTHER): Payer: Medicaid Other | Admitting: Oncology

## 2010-11-13 ENCOUNTER — Other Ambulatory Visit: Payer: Self-pay | Admitting: Physician Assistant

## 2010-11-13 ENCOUNTER — Ambulatory Visit: Payer: Medicaid Other | Admitting: Physical Therapy

## 2010-11-13 DIAGNOSIS — C50419 Malignant neoplasm of upper-outer quadrant of unspecified female breast: Secondary | ICD-10-CM

## 2010-11-13 DIAGNOSIS — Z170 Estrogen receptor positive status [ER+]: Secondary | ICD-10-CM

## 2010-11-13 DIAGNOSIS — Z5111 Encounter for antineoplastic chemotherapy: Secondary | ICD-10-CM

## 2010-11-13 DIAGNOSIS — Z5112 Encounter for antineoplastic immunotherapy: Secondary | ICD-10-CM

## 2010-11-13 LAB — CBC WITH DIFFERENTIAL/PLATELET
BASO%: 0.7 % (ref 0.0–2.0)
Eosinophils Absolute: 0.6 10*3/uL — ABNORMAL HIGH (ref 0.0–0.5)
LYMPH%: 20.4 % (ref 14.0–49.7)
MCHC: 33.5 g/dL (ref 31.5–36.0)
MONO#: 0.3 10*3/uL (ref 0.1–0.9)
NEUT#: 4.3 10*3/uL (ref 1.5–6.5)
Platelets: 229 10*3/uL (ref 145–400)
RBC: 3.87 10*6/uL (ref 3.70–5.45)
RDW: 14.1 % (ref 11.2–14.5)
WBC: 6.7 10*3/uL (ref 3.9–10.3)
lymph#: 1.4 10*3/uL (ref 0.9–3.3)

## 2010-11-16 ENCOUNTER — Ambulatory Visit: Payer: Medicaid Other | Admitting: Physical Therapy

## 2010-11-17 ENCOUNTER — Encounter (HOSPITAL_BASED_OUTPATIENT_CLINIC_OR_DEPARTMENT_OTHER): Payer: Medicaid Other | Admitting: Oncology

## 2010-11-17 ENCOUNTER — Other Ambulatory Visit: Payer: Self-pay | Admitting: Oncology

## 2010-11-17 ENCOUNTER — Ambulatory Visit (HOSPITAL_COMMUNITY)
Admission: RE | Admit: 2010-11-17 | Discharge: 2010-11-17 | Disposition: A | Discharge status: Home / Self Care | Payer: Medicaid Other | Source: Ambulatory Visit | Attending: Oncology | Admitting: Oncology

## 2010-11-17 DIAGNOSIS — Z5112 Encounter for antineoplastic immunotherapy: Secondary | ICD-10-CM

## 2010-11-17 DIAGNOSIS — Z170 Estrogen receptor positive status [ER+]: Secondary | ICD-10-CM

## 2010-11-17 DIAGNOSIS — I10 Essential (primary) hypertension: Secondary | ICD-10-CM | POA: Insufficient documentation

## 2010-11-17 DIAGNOSIS — Z79899 Other long term (current) drug therapy: Secondary | ICD-10-CM | POA: Insufficient documentation

## 2010-11-17 DIAGNOSIS — C50919 Malignant neoplasm of unspecified site of unspecified female breast: Secondary | ICD-10-CM | POA: Insufficient documentation

## 2010-11-17 DIAGNOSIS — C50419 Malignant neoplasm of upper-outer quadrant of unspecified female breast: Secondary | ICD-10-CM

## 2010-11-17 LAB — CBC WITH DIFFERENTIAL/PLATELET
BASO%: 0.3 % (ref 0.0–2.0)
Eosinophils Absolute: 0.7 10*3/uL — ABNORMAL HIGH (ref 0.0–0.5)
HCT: 36.1 % (ref 34.8–46.6)
MCHC: 33.4 g/dL (ref 31.5–36.0)
MONO#: 0.4 10*3/uL (ref 0.1–0.9)
NEUT#: 4 10*3/uL (ref 1.5–6.5)
NEUT%: 63.8 % (ref 38.4–76.8)
Platelets: 228 10*3/uL (ref 145–400)
RBC: 4.05 10*6/uL (ref 3.70–5.45)
WBC: 6.3 10*3/uL (ref 3.9–10.3)
lymph#: 1.2 10*3/uL (ref 0.9–3.3)

## 2010-11-17 LAB — CANCER ANTIGEN 27.29: CA 27.29: 19 U/mL (ref 0–39)

## 2010-11-17 LAB — COMPREHENSIVE METABOLIC PANEL
ALT: 14 U/L (ref 0–35)
CO2: 23 mEq/L (ref 19–32)
Calcium: 8.9 mg/dL (ref 8.4–10.5)
Chloride: 104 mEq/L (ref 96–112)
Glucose, Bld: 135 mg/dL — ABNORMAL HIGH (ref 70–99)
Sodium: 138 mEq/L (ref 135–145)
Total Protein: 6.4 g/dL (ref 6.0–8.3)

## 2010-11-18 ENCOUNTER — Ambulatory Visit: Payer: Medicaid Other | Admitting: Physical Therapy

## 2010-11-20 ENCOUNTER — Ambulatory Visit: Payer: Medicaid Other | Admitting: Physical Therapy

## 2010-11-23 ENCOUNTER — Ambulatory Visit: Payer: Medicaid Other | Admitting: Physical Therapy

## 2010-11-24 ENCOUNTER — Encounter (HOSPITAL_BASED_OUTPATIENT_CLINIC_OR_DEPARTMENT_OTHER): Payer: Medicaid Other | Admitting: Oncology

## 2010-11-24 DIAGNOSIS — Z170 Estrogen receptor positive status [ER+]: Secondary | ICD-10-CM

## 2010-11-24 DIAGNOSIS — C773 Secondary and unspecified malignant neoplasm of axilla and upper limb lymph nodes: Secondary | ICD-10-CM

## 2010-11-24 DIAGNOSIS — C50419 Malignant neoplasm of upper-outer quadrant of unspecified female breast: Secondary | ICD-10-CM

## 2010-11-25 ENCOUNTER — Ambulatory Visit: Payer: Medicaid Other | Admitting: Physical Therapy

## 2010-11-30 ENCOUNTER — Ambulatory Visit: Payer: Medicaid Other | Admitting: Physical Therapy

## 2010-12-01 ENCOUNTER — Ambulatory Visit (HOSPITAL_BASED_OUTPATIENT_CLINIC_OR_DEPARTMENT_OTHER)
Admission: RE | Admit: 2010-12-01 | Discharge: 2010-12-01 | Disposition: A | Discharge status: Home / Self Care | Payer: Medicaid Other | Source: Ambulatory Visit | Attending: Surgery | Admitting: Surgery

## 2010-12-01 DIAGNOSIS — Z452 Encounter for adjustment and management of vascular access device: Secondary | ICD-10-CM | POA: Insufficient documentation

## 2010-12-01 DIAGNOSIS — C50919 Malignant neoplasm of unspecified site of unspecified female breast: Secondary | ICD-10-CM | POA: Insufficient documentation

## 2010-12-01 HISTORY — PX: PORT-A-CATH REMOVAL: SHX5289

## 2010-12-01 LAB — POCT I-STAT, CHEM 8
Calcium, Ion: 1.22 mmol/L (ref 1.12–1.32)
HCT: 36 % (ref 36.0–46.0)
Hemoglobin: 12.2 g/dL (ref 12.0–15.0)
Sodium: 140 mEq/L (ref 135–145)
TCO2: 25 mmol/L (ref 0–100)

## 2010-12-02 ENCOUNTER — Ambulatory Visit: Payer: Medicaid Other | Admitting: Physical Therapy

## 2010-12-02 NOTE — Op Note (Addendum)
  NAMECAYLEIGH, PAULL NO.:  [id number]  MEDICAL RECORD NO.:  [id number]  LOCATION:  [facility]                         FACILITY:  [facility]  PHYSICIAN:  Currie Paris, M.D.DATE OF BIRTH:  Dec 13, 1975  DATE OF PROCEDURE:  12/01/2010 DATE OF DISCHARGE:                              OPERATIVE REPORT   PREOPERATIVE DIAGNOSIS:  Unneeded Port-A-Cath.  POSTOPERATIVE DIAGNOSIS:  Unneeded Port-A-Cath.  PROCEDURE:  Removal of Port-A-Cath.  SURGEON:  Currie Paris, MD  ANESTHESIA:  MAC.  CLINICAL HISTORY:  This is a 35 year old lady who has a port that she no longer needs following chemoradiation and surgery for her right breast cancer.  DESCRIPTION OF PROCEDURE:  The patient was seen in the preoperative area and we reviewed the plans for the procedure.  She had no further questions.  I circled the port in the left anterior chest wall.  The patient was taken to the operating room and given IV sedation.  The upper chest was prepped as a sterile field and draped.  The time-out was done.  I used a combination of 1% plain Xylocaine and 0.5% Marcaine with epi for local and infiltrated all around the port as well as the skin.  The old scar was excised since it was little bit wide.  The port was identified and the pocket opened.  It was freed up and there did not appear to be any holding sutures.  The port was backed out a little bit and a figure-of-eight 3-0 Vicryl placed around the tract and the tubing removed.  This came out intact and appeared to be without any obvious problems.  The suture was tied down to prevent backbleeding.  Everything appeared to be dry, so the incision was closed with 3-0 Vicryl, 4-0 Monocryl, subcuticular, and Dermabond.  The patient tolerated the procedure well and there were no complications.  All counts were correct.     Currie Paris, M.D.   ______________________________ Currie Paris, M.D.    CJS/MEDQ  D:   12/01/2010  T:  12/01/2010  Job:  [id number]  Electronically Signed by Cyndia Bent M.D. on 12/05/2010 11:39:50 AM

## 2010-12-03 ENCOUNTER — Encounter: Payer: Medicaid Other | Admitting: Physical Therapy

## 2010-12-07 ENCOUNTER — Encounter: Payer: Medicaid Other | Admitting: Physical Therapy

## 2010-12-11 ENCOUNTER — Encounter: Payer: Medicaid Other | Admitting: Physical Therapy

## 2010-12-17 ENCOUNTER — Encounter: Payer: Self-pay | Admitting: *Deleted

## 2010-12-17 ENCOUNTER — Ambulatory Visit (INDEPENDENT_AMBULATORY_CARE_PROVIDER_SITE_OTHER): Payer: Medicaid Other | Admitting: Family Medicine

## 2010-12-17 ENCOUNTER — Encounter: Payer: Self-pay | Admitting: Family Medicine

## 2010-12-17 VITALS — BP 145/97 | HR 105 | Temp 98.1°F | Ht 64.0 in | Wt 254.9 lb

## 2010-12-17 DIAGNOSIS — J328 Other chronic sinusitis: Secondary | ICD-10-CM

## 2010-12-17 DIAGNOSIS — J019 Acute sinusitis, unspecified: Secondary | ICD-10-CM | POA: Insufficient documentation

## 2010-12-17 DIAGNOSIS — M722 Plantar fascial fibromatosis: Secondary | ICD-10-CM

## 2010-12-17 MED ORDER — KETOROLAC TROMETHAMINE 30 MG/ML IJ SOLN
30.0000 mg | Freq: Once | INTRAMUSCULAR | Status: AC
  Administered 2010-12-17: 30 mg via INTRAMUSCULAR

## 2010-12-17 MED ORDER — SULFAMETHOXAZOLE-TRIMETHOPRIM 800-160 MG PO TABS: 1.0000 | ORAL_TABLET | Freq: Two times a day (BID) | ORAL | Status: AC

## 2010-12-17 NOTE — Assessment & Plan Note (Signed)
Will refer to podiatry for evaluation of foot problems.  Already has appt tomorrow, however needs referral from [place].

## 2010-12-17 NOTE — Assessment & Plan Note (Signed)
Has history of chronic sinusitis, acutely gotten worse.  Will treat for acute sinusitis with bactrim as she has already been tried on azithro.  Will refer to ENT for further evaluation as she has had septal surgery before and ? If this may need revision or further surgery.

## 2010-12-17 NOTE — Progress Notes (Signed)
  Subjective:    Patient ID: Karen Hopkins, female    DOB: 06-26-1975, 35 y.o.   MRN: [id number]  HPI Karen Hopkins is here to request referrals to  1. Podiatry:  Has had persistent pain in the bottom of her pain 2/2 to suspected plantar fasciitis.  Also has developed blistering on the bottom of both her feet and she says that they burn.  Denies any skin problems anywhere else.  Oncologist told her that this was not related to cancer treatment and originally was the one who referred her to podiatry, however she was told that she needed referral from our office.  Has appt. For tomorrow already 2. ENT:  Has had chronic sinusitis for quite sometime.  Has had nasal septal sugery in the past, would like referral back to ENT for further evaluation to see if she may need additional surgery.  Symptoms today include severe headache with mild photophobia, purulent and bloody nasal discharge.  Denies tooth pain, fever, chills.   Review of Systems   See HPI Objective:   Physical Exam  Constitutional: She appears well-developed and well-nourished. She appears distressed.       Tearful in room with lights out.   HENT:  Head: Normocephalic and atraumatic.  Nose: Mucosal edema and rhinorrhea present. Right sinus exhibits maxillary sinus tenderness and frontal sinus tenderness. Left sinus exhibits maxillary sinus tenderness and frontal sinus tenderness.  Mouth/Throat: Oropharynx is clear and moist.  Eyes: EOM are normal. Pupils are equal, round, and reactive to light.  Neck: Normal range of motion. Neck supple.  Cardiovascular: Normal rate, regular rhythm and normal heart sounds.   Pulmonary/Chest: Effort normal and breath sounds normal.  Musculoskeletal:       Right foot: She exhibits tenderness.       Left foot: She exhibits tenderness.       Tenderness along calcaneal tuberosity where plantar fascia inserts bilaterally.  Skin:       Small ruptured blisters on feet bilaterally, non erythematous, no signs of  infectoin.           Assessment & Plan:

## 2010-12-17 NOTE — Patient Instructions (Signed)
It was nice seeing you today.  I will send over the referral to the foot doctor and the ENT doctor.  I have sent over a prescription for an antibiotic for your sinuses.  Please take all of the antibiotic. If you have questions please feel free to call our office.

## 2010-12-18 ENCOUNTER — Ambulatory Visit: Payer: Medicaid Other | Admitting: Family Medicine

## 2010-12-18 ENCOUNTER — Telehealth: Payer: Self-pay | Admitting: Family Medicine

## 2010-12-18 NOTE — Telephone Encounter (Signed)
Advised patient that it will be fine to cut tab in half . Then she asks about crushing tablet. Advised her to call her pharmacy and ask pharmacist this . She will do

## 2010-12-18 NOTE — Telephone Encounter (Signed)
Was given abx (bactrim) and it's too big to take and wants to talk to nurse about if she can cut it in half

## 2011-01-06 ENCOUNTER — Encounter (HOSPITAL_BASED_OUTPATIENT_CLINIC_OR_DEPARTMENT_OTHER): Payer: Medicaid Other | Admitting: Oncology

## 2011-01-06 DIAGNOSIS — G893 Neoplasm related pain (acute) (chronic): Secondary | ICD-10-CM

## 2011-01-06 DIAGNOSIS — Z170 Estrogen receptor positive status [ER+]: Secondary | ICD-10-CM

## 2011-01-06 DIAGNOSIS — C50419 Malignant neoplasm of upper-outer quadrant of unspecified female breast: Secondary | ICD-10-CM

## 2011-01-12 ENCOUNTER — Ambulatory Visit: Payer: Medicaid Other | Admitting: Psychiatry

## 2011-03-25 ENCOUNTER — Other Ambulatory Visit: Payer: Self-pay | Admitting: Oncology

## 2011-03-25 ENCOUNTER — Encounter (HOSPITAL_BASED_OUTPATIENT_CLINIC_OR_DEPARTMENT_OTHER): Payer: Medicaid Other | Admitting: Oncology

## 2011-03-25 DIAGNOSIS — G893 Neoplasm related pain (acute) (chronic): Secondary | ICD-10-CM

## 2011-03-25 DIAGNOSIS — C50419 Malignant neoplasm of upper-outer quadrant of unspecified female breast: Secondary | ICD-10-CM

## 2011-03-25 DIAGNOSIS — Z170 Estrogen receptor positive status [ER+]: Secondary | ICD-10-CM

## 2011-03-25 DIAGNOSIS — Z5111 Encounter for antineoplastic chemotherapy: Secondary | ICD-10-CM

## 2011-03-25 DIAGNOSIS — C773 Secondary and unspecified malignant neoplasm of axilla and upper limb lymph nodes: Secondary | ICD-10-CM

## 2011-03-25 DIAGNOSIS — Z5112 Encounter for antineoplastic immunotherapy: Secondary | ICD-10-CM

## 2011-03-25 LAB — CBC WITH DIFFERENTIAL/PLATELET
Eosinophils Absolute: 0.4 10*3/uL (ref 0.0–0.5)
HGB: 12.2 g/dL (ref 11.6–15.9)
LYMPH%: 26.3 % (ref 14.0–49.7)
MONO#: 0.4 10*3/uL (ref 0.1–0.9)
NEUT#: 4.2 10*3/uL (ref 1.5–6.5)
Platelets: 239 10*3/uL (ref 145–400)
RBC: 4.02 10*6/uL (ref 3.70–5.45)
RDW: 13.7 % (ref 11.2–14.5)
WBC: 6.9 10*3/uL (ref 3.9–10.3)

## 2011-03-25 LAB — COMPREHENSIVE METABOLIC PANEL
ALT: 16 U/L (ref 0–35)
AST: 18 U/L (ref 0–37)
Alkaline Phosphatase: 52 U/L (ref 39–117)
BUN: 11 mg/dL (ref 6–23)
Calcium: 9.1 mg/dL (ref 8.4–10.5)
Chloride: 105 mEq/L (ref 96–112)
Creatinine, Ser: 0.64 mg/dL (ref 0.50–1.10)
Potassium: 3.5 mEq/L (ref 3.5–5.3)
Sodium: 140 mEq/L (ref 135–145)
Total Bilirubin: 0.3 mg/dL (ref 0.3–1.2)

## 2011-04-13 ENCOUNTER — Ambulatory Visit (INDEPENDENT_AMBULATORY_CARE_PROVIDER_SITE_OTHER): Payer: Medicaid Other | Admitting: Family Medicine

## 2011-04-13 ENCOUNTER — Ambulatory Visit: Payer: Medicaid Other | Admitting: Family Medicine

## 2011-04-13 ENCOUNTER — Encounter: Payer: Self-pay | Admitting: Family Medicine

## 2011-04-13 ENCOUNTER — Ambulatory Visit: Payer: Medicaid Other

## 2011-04-13 ENCOUNTER — Other Ambulatory Visit: Payer: Self-pay | Admitting: Oncology

## 2011-04-13 VITALS — BP 145/91 | HR 90 | Temp 97.9°F | Ht 64.0 in | Wt 260.0 lb

## 2011-04-13 DIAGNOSIS — R51 Headache: Secondary | ICD-10-CM

## 2011-04-13 DIAGNOSIS — R519 Headache, unspecified: Secondary | ICD-10-CM

## 2011-04-13 DIAGNOSIS — B9789 Other viral agents as the cause of diseases classified elsewhere: Secondary | ICD-10-CM

## 2011-04-13 DIAGNOSIS — B349 Viral infection, unspecified: Secondary | ICD-10-CM

## 2011-04-13 MED ORDER — KETOROLAC TROMETHAMINE 60 MG/2ML IM SOLN
60.0000 mg | Freq: Once | INTRAMUSCULAR | Status: AC
  Administered 2011-04-13: 60 mg via INTRAMUSCULAR

## 2011-04-13 NOTE — Patient Instructions (Signed)
Nice to meet you. You have a flu-like illness. If you start having fever, vomiting, worsened headache, vision changes, then call doctor or go to the ED. Restart aleve tomorrow as needed. Keep drinking plenty of fluids. Follow up with Dr. Ashley Royalty for blood pressure in next 2-3 months.  Influenza, Adult Influenza ("the flu") is a viral infection of the respiratory tract. It causes chills, fever, cough, headache, body aches, and sore throat. Influenza in general will make you feel sicker than when you have a common cold. Symptoms of the illness typically last a few days. Cough and fatigue may continue for as long as 7 to 10 days. Influenza is highly contagious. It spreads easily to others in the droplets from coughs and sneezes. People frequently become infected by touching something that was recently contaminated with the virus and then touch their mouth, nose or eyes. This infection is caused by a virus. Symptoms will not be reduced or improved by taking an antibiotic. Antibiotics are medications that kill bacteria, not viruses. DIAGNOSIS  Diagnosis of influenza is often made based on the history and physical examination as well as the presence of influenza reports occurring in your community. Testing can be done if the diagnosis is not certain. TREATMENT  Since influenza is caused by a virus, antibiotics are not helpful. Your caregiver may prescribe antiviral medicines to shorten the illness and lessen the severity. Your caregiver may also recommend influenza vaccination and/or antiviral medicines for your family members in order to prevent the spread of influenza to them. HOME CARE INSTRUCTIONS  DO NOT GIVE ASPIRIN TO PERSONS WITH INFLUENZA WHO ARE UNDER 72 YEARS OF AGE. This could lead to brain and liver damage (Reye's syndrome). Read the label on over-the-counter medicines.   Stay home from work or school if at all possible until most of your symptoms are gone.   Only take over-the-counter or  prescription medicines for pain, discomfort, or fever as directed by your caregiver.   Use a cool mist humidifier to increase air moisture. This will make breathing easier.   Rest until your temperature is nearly normal: 98.6 F (37 C). This usually takes 3 to 4 days. Be sure you get plenty of rest.   Drink at least eight, eight-ounce glasses of fluids per day. Fluids include water, juice, broth, gelatin, or lemonade.   Cover your mouth and nose when coughing or sneezing and wash your hands often to prevent the spread of this virus to other persons.  PREVENTION  Annual influenza vaccination (flu shots) is the best way to avoid getting influenza. An annual flu shot is now routinely recommended for all adults in the [place] SEEK MEDICAL CARE IF:   You develop shortness of breath while resting.   You have a deep cough with production of mucous or chest pain.   You develop nausea (feeling sick to your stomach), vomiting, or diarrhea.  SEEK IMMEDIATE MEDICAL CARE IF:   You have difficulty breathing, become short of breath, or your skin or nails turn bluish.   You develop severe neck pain or stiffness.   You develop a severe headache, facial pain, or earache.   You have a fever.   You develop nausea or vomiting that cannot be controlled.  Document Released: 05/21/2000 Document Revised: 02/03/2011 Document Reviewed: 03/26/2009 [facility] Patient Information 2012 [place], [place].

## 2011-04-13 NOTE — Progress Notes (Signed)
  Subjective:    Patient ID: Karen Hopkins, female    DOB: [birth year], 35 y.o.   MRN: [id number]  HPI 1. Myalgias/HA. Patient began feeling body aches, headache, chills, poor appetite approximately 3 days ago. Headache is bilateral frontal and dull in nature, mild associated photophobia, rated 8/10. Does not feel like migraine which she has had in the past.  She hasn't been able to drink fluids however is not hungry. Notably, the patient is a breast cancer survivor and underwent chemotherapy and radiation with last treatment on June 8 this year. She was seen at oncology followup 2 weeks ago and was advised to get flu shot however she forgot to do this. Denies sick contacts. Has tried naproxen and tramadol for headache without relief.   She denies nausea, vomiting, or diarrhea, vision changes, neck stiffness, rash.  Review of Systems See history of present illness otherwise negative.     Objective:   Physical Exam  Vitals reviewed. Constitutional: She is oriented to [place], place, and time. She appears well-developed and well-nourished. No distress.       Mild photophobia.  HENT:  Head: Normocephalic and atraumatic.  Mouth/Throat: Oropharynx is clear and moist. No oropharyngeal exudate.  Eyes: Conjunctivae and EOM are normal. Pupils are equal, round, and reactive to light.  Neck: Normal range of motion. Neck supple.       Normal cervical range of motion. No neck stiffness.  Cardiovascular: Normal rate, regular rhythm and normal heart sounds.   No murmur heard. Pulmonary/Chest: Effort normal and breath sounds normal. No respiratory distress. She has no wheezes. She has no rales.  Musculoskeletal: She exhibits no edema.  Lymphadenopathy:    She has no cervical adenopathy.  Neurological: She is alert and oriented to [place], place, and time. No cranial nerve deficit. She exhibits normal muscle tone. Coordination normal.  Skin: No rash noted.          Assessment & Plan:

## 2011-04-13 NOTE — Assessment & Plan Note (Signed)
Influenza type viral syndrome for past 3 days. Absence of fever, neurologic findings, and neck stiffness make meningitis much less likely. Influenza testing and viral treatment would be low yield at this point. Will continue symptomatic treatment with fluid hydration, rest, NSAIDs. Given IM toradol today. Recommend return for influenza vaccine in one week. Discussed red flag symptoms including fever, vision changes, worsening headache to return to care.

## 2011-04-19 ENCOUNTER — Telehealth: Payer: Self-pay | Admitting: Oncology

## 2011-04-27 ENCOUNTER — Other Ambulatory Visit: Payer: Self-pay | Admitting: Oncology

## 2011-05-03 ENCOUNTER — Other Ambulatory Visit: Payer: Self-pay | Admitting: *Deleted

## 2011-05-03 DIAGNOSIS — C50419 Malignant neoplasm of upper-outer quadrant of unspecified female breast: Secondary | ICD-10-CM

## 2011-05-03 MED ORDER — ALPRAZOLAM 0.5 MG PO TABS: ORAL_TABLET | ORAL | Status: DC

## 2011-05-03 NOTE — Telephone Encounter (Signed)
THIS PRESCRIPTION WAS CALLED TO [facility] AT 6:30PM.

## 2011-05-05 ENCOUNTER — Telehealth: Payer: Self-pay | Admitting: Oncology

## 2011-05-05 NOTE — Telephone Encounter (Signed)
Made a note °

## 2011-05-08 ENCOUNTER — Telehealth: Payer: Self-pay | Admitting: Oncology

## 2011-05-08 NOTE — Telephone Encounter (Signed)
per pof 06/19 called pt and scheduled her appt for jan2013

## 2011-05-11 ENCOUNTER — Telehealth: Payer: Self-pay | Admitting: *Deleted

## 2011-05-11 DIAGNOSIS — C50919 Malignant neoplasm of unspecified site of unspecified female breast: Secondary | ICD-10-CM

## 2011-05-11 MED ORDER — METHADONE HCL 5 MG PO TABS: 5.0000 mg | ORAL_TABLET | Freq: Three times a day (TID) | ORAL | Status: AC

## 2011-05-11 NOTE — Telephone Encounter (Signed)
Pt called to this RN to discuss  pain level not relieved with current medications. Pt has been referred per MD to pain clinic but she will not be seen for a couple of months.  Per discussion Analys states pain is localized in area of mastectomy and lymphectomy. Zakeya also has noted lymphedema with treatment under the lymphedema clinic.  Pain is described as multifaceted including " burning, pulling, tightness, tender to touch and throbbing. Pain interrupts sleep at night and limits pt's abilities to perform ADL's.  Current medications include tramadol and gabapentin.  Pt was previously on hydrocodone with some better relief with it vs the tramadol.  Above will be discussed with MD for further interventions until pt can be assessed at the pain clinic.

## 2011-05-11 NOTE — Telephone Encounter (Signed)
Per MD review prescription for methadone obtained.  Called pt and discussed.

## 2011-05-12 ENCOUNTER — Ambulatory Visit (INDEPENDENT_AMBULATORY_CARE_PROVIDER_SITE_OTHER): Payer: Medicaid Other | Admitting: *Deleted

## 2011-05-12 DIAGNOSIS — Z23 Encounter for immunization: Secondary | ICD-10-CM

## 2011-05-14 ENCOUNTER — Telehealth: Payer: Self-pay | Admitting: Oncology

## 2011-05-14 NOTE — Telephone Encounter (Signed)
Made a note °

## 2011-05-19 ENCOUNTER — Telehealth: Payer: Self-pay | Admitting: *Deleted

## 2011-05-19 NOTE — Telephone Encounter (Signed)
Pt. Calls requesting referral back to the lymphedema clinic.  She lost her glove yesterday and now is noticing some swelling and clinic said they needed a new referral.  Would like to go back to the clinic in [facility].. Will send referral to Dr. Darnelle Catalan for signature.

## 2011-05-20 NOTE — Progress Notes (Signed)
Referral form faxed to lymphedema clinic. dph

## 2011-05-24 ENCOUNTER — Ambulatory Visit (INDEPENDENT_AMBULATORY_CARE_PROVIDER_SITE_OTHER): Payer: Medicaid Other | Admitting: Family Medicine

## 2011-05-24 ENCOUNTER — Encounter: Payer: Self-pay | Admitting: Family Medicine

## 2011-05-24 VITALS — BP 135/80 | HR 111 | Temp 98.3°F | Ht 64.0 in | Wt 277.0 lb

## 2011-05-24 DIAGNOSIS — M7989 Other specified soft tissue disorders: Secondary | ICD-10-CM

## 2011-05-24 DIAGNOSIS — I890 Lymphedema, not elsewhere classified: Secondary | ICD-10-CM | POA: Insufficient documentation

## 2011-05-24 DIAGNOSIS — R404 Transient alteration of awareness: Secondary | ICD-10-CM

## 2011-05-24 DIAGNOSIS — R400 Somnolence: Secondary | ICD-10-CM | POA: Insufficient documentation

## 2011-05-24 NOTE — Patient Instructions (Signed)
It was nice seeing you today For the swelling in your legs I would get some compression stockings and be sure to keep your legs elevated when you are at home i have ordered an ultrasound of your arm to evaluate for blood clot, they should call to schedule this with you I have also ordered a sleep study, in the mean time I would like for you to try and avoid driving if you are feeling tired.  Another thing that may be causing you to feel tired is your methadone, I would try to limit this to only the times you really need it.

## 2011-05-25 ENCOUNTER — Telehealth: Payer: Self-pay | Admitting: *Deleted

## 2011-05-25 ENCOUNTER — Other Ambulatory Visit: Payer: Self-pay | Admitting: *Deleted

## 2011-05-25 DIAGNOSIS — C50919 Malignant neoplasm of unspecified site of unspecified female breast: Secondary | ICD-10-CM

## 2011-05-25 NOTE — Telephone Encounter (Signed)
Nychelle called to this RN to state concern and desire for visit due to ongoing bilateral leg swelling- occurring post chemo.  Olina states she went to her primary MD due to ongoing symptoms including noted sleepiness occurring especially while driving. He is scheduling pt for a sleep apnea study.  Note above symptom occurred prior to start of methadone and is not exacerbated by it's use.  Pt is also having bilateral leg swelling that primary MD can not explain " he told me to get those compression hose but said he doesn't know why I am swelling "  Xiadani states concerns with overall symptoms and her desire to feel more active and " more like myself "  This RN discussed with Chloee post chemo symptoms regarding above. Plan at present since primary MD did not pursue further is for assessment by this office and if needed additional referrals.  Per above conversation appointment requested for visit.

## 2011-05-26 ENCOUNTER — Other Ambulatory Visit: Payer: Self-pay | Admitting: *Deleted

## 2011-05-26 ENCOUNTER — Encounter: Payer: Self-pay | Admitting: *Deleted

## 2011-05-26 ENCOUNTER — Telehealth: Payer: Self-pay | Admitting: *Deleted

## 2011-05-26 NOTE — Telephone Encounter (Signed)
spoke with  patient the patient can not do 9:45am she can only do the 1:45pm appointment for 05-27-2011

## 2011-05-27 ENCOUNTER — Encounter: Payer: Self-pay | Admitting: Physician Assistant

## 2011-05-27 ENCOUNTER — Ambulatory Visit (HOSPITAL_BASED_OUTPATIENT_CLINIC_OR_DEPARTMENT_OTHER): Payer: Medicaid Other | Admitting: Physician Assistant

## 2011-05-27 ENCOUNTER — Ambulatory Visit: Payer: Medicaid Other | Admitting: Physician Assistant

## 2011-05-27 ENCOUNTER — Telehealth: Payer: Self-pay | Admitting: *Deleted

## 2011-05-27 VITALS — BP 119/75 | HR 105 | Temp 98.7°F | Ht 64.0 in | Wt 276.1 lb

## 2011-05-27 DIAGNOSIS — C50419 Malignant neoplasm of upper-outer quadrant of unspecified female breast: Secondary | ICD-10-CM

## 2011-05-27 DIAGNOSIS — C50919 Malignant neoplasm of unspecified site of unspecified female breast: Secondary | ICD-10-CM

## 2011-05-27 DIAGNOSIS — G8928 Other chronic postprocedural pain: Secondary | ICD-10-CM

## 2011-05-27 DIAGNOSIS — I890 Lymphedema, not elsewhere classified: Secondary | ICD-10-CM

## 2011-05-27 DIAGNOSIS — Z170 Estrogen receptor positive status [ER+]: Secondary | ICD-10-CM

## 2011-05-27 NOTE — Progress Notes (Signed)
Hematology and Oncology Follow Up Visit  Karen Hopkins [id number] Oct 20, 1975 35 y.o. 05/27/2011    HPI: The patient noted swelling and redness, as well as some tenderness, over her right breast for some time.  She thought this might be related to pregnancy, since she delivered her third child about 8 months ago.  As the problem did not improve, she brought it to her primary care physician's attention.  He treated her with Bactrim, which she says she could not tolerate because of nausea and vomiting, but which in any case did not resolve the problem, so he set her up for mammography at [facility] 08/26/2009.  Dr. Deboraha Sprang found by exam marked skin thickening and erythema of the right breast extending pretty much throughout the breast.  By mammography there were scattered fibroglandular densities, and an asymmetric density in the right upper outer quadrant with suspicious microcalcifications.  There were also enlarged right axillary lymph nodes.  The left appeared normal.  By ultrasound there was an ill defined area of hypoechoic tissue in the right upper outer quadrant that was difficult to measure.  There were also abnormal right axillary lymph nodes.    Ultrasound guided biopsy was performed the same day, and showed [phone]) a poorly differentiated invasive ductal carcinoma, which was estrogen receptor poor at 3%, progesterone receptor positive at 14% with an elevated proliferation marker at 50%, but importantly with strong amplification of HER-2 by CISH with a ratio of 5.20.  With this information, the patient was referred to Dr. Jamey Ripa, and bilateral breast MRIs were obtained 09/02/2009.  This showed an area up to 10.6 cm in the right breast showing confluent mass and non-mass enhancement.  There was marked skin thickening involving the entire right breast, and numerous bulky right axillary lymph nodes were identified.  The left breast was unremarkable, and there was no evidence of  internal mammary lymph node involvement.  Patient was treated in the neoadjuvant setting with 4 dose dense cycles of doxorubicin and cyclophosphamide, followed by 12 weekly doses of paclitaxel and trastuzumab. Trastuzumab was continued for a total of one year. Following one year, an echocardiogram did in fact show well preserved ejection fraction.  Patient status post definitive right modified radical mastectomy in October 2011, with a residual 2.5 mm area of tumor, grade 3, with one of 21 lymph nodes involved. Tumor was again ER and PR positive.  Received postmastectomy radiation, completed in January 2012, at which time she began on tamoxifen at 20 mg daily.  Patient is known to be BRCA1 and BRCA2 negative.  Patient has a history of chronic postsurgical pain in the right chest wall, right axillary region, and right shoulder, in addition to chronic lymphedema in the right upper extremity.  Interim History:   Patient is here today for followup in between routine scheduled visits. She contacted our office earlier this week with complaints of increased swelling in her extremities. She does have a history of chronic lymphedema in the right upper chest remedies for which she scheduled to be reevaluated the lymphedema clinic next week. She has noticed, however, not only increased swelling in the upper choice, but also in the lower extremities as well. She has continued on her tamoxifen at 20 mg daily. She has no history of abnormal clotting.  The patient was evaluated by [facility] this week. She has been scheduled for a Doppler of the upper extremities tomorrow, December 21, to evaluate for  upper chamber the DVT. She's also been scheduled for a sleep study to evaluate for sleep apnea in light of her increased fatigue.  The patient continues to have some shortness of breath with exertion, which is not new, and has not worsened or changed. She does  note, however, that she tends to set up or at least proper self up to sleep due to some shortness of breath. She has a cough, often dry, occasionally productive of clear phlegm. No hemoptysis. No pleurisy. No fevers, chills, or night sweats.  Patient continues to have problems with chronic pain, and we are in the process of trying to set her up with a local pain clinic which is taking a while. She was on methadone briefly but "didn't like how she felt" when she took it. She got little relief from tramadol. She continues on gabapentin which also helps minimally.  I will mention that the patient is a little less depressed than at her last visit here in October. She gradually tapered off of the Effexor, and is back on Celexa, with an occasional lorazepam.  A detailed review of systems is otherwise noncontributory as noted below.  Review of Systems: Constitutional:  fatigued and generally weak, no fever Eyes: negative [id number] Cardiovascular: positive for - dyspnea on exertion, edema and orthopnea negative for - chest pain or irregular heartbeat Respiratory: positive for - cough, orthopnea and shortness of breath negative for - hemoptysis or pleuritic pain Neurological: negative Dermatological: negative Gastrointestinal: no abdominal pain, change in bowel habits, or black or bloody stools Genito-Urinary: no dysuria, trouble voiding, or hematuria Hematological and Lymphatic: negative Breast: negative Musculoskeletal: positive for - joint pain and muscle pain, edema - bilateral in upper and lower extremities Remaining ROS negative.  FAMILY HISTORY:  The patient's father is alive at age 25.  The patient's mother is alive at age 83.  The patient has two sisters, [name] and [name]. The patient has no brothers.  There is no history of breast or ovarian cancer in the immediate family, but of the patient's mother's mother's six sisters, two (the patient's great-aunts) had ovarian cancer.  GYNECOLOGIC  HISTORY:  The patient is GX, P3.  First child was premature.  Age at first delivery, 35 years old.    SOCIAL HISTORY:  She is currently disabled.  Maryl's children are [name], and [employer].  They are all boys, all at home.  The patient attends a non-denominational church in [place], which she considers her home, but she is currently living in [place].  Medications:   I have reviewed the patient's current medications.  Current Outpatient Prescriptions  Medication Sig Dispense Refill  . ALPRAZolam (XANAX) 0.5 MG tablet ALPRAZOLAM XR  TAKE ONE TAB TWICE A DAY  60 tablet  0  . citalopram (CELEXA) 20 MG tablet Take 20 mg by mouth daily.        Marland Kitchen gabapentin (NEURONTIN) 300 MG capsule Take 300 mg by mouth 3 (three) times daily. Prescribed by oncologist       . LORazepam (ATIVAN) 0.5 MG tablet 1-2 tabs three times a day as needed (prescribed by oncologist)       . tamoxifen (NOLVADEX) 20 MG tablet Take 20 mg by mouth 2 (two) times daily.        Marland Kitchen triamterene-hydrochlorothiazide (MAXZIDE-25) 37.5-25 MG per tablet Take 1 tablet by mouth daily.        Marland Kitchen albuterol (VENTOLIN HFA) 108 (90 BASE) MCG/ACT inhaler Inhale 1-2 puffs into the lungs every 4 (  four) hours as needed. wheezing       . cetirizine (ZYRTEC) 10 MG tablet Take 10 mg by mouth daily.        . fluticasone (FLONASE) 50 MCG/ACT nasal spray 2 sprays by Nasal route daily. Each nostril       . HYDROcodone-acetaminophen (NORCO) 5-325 MG per tablet Take 1 tablet by mouth 2 (two) times daily.        Marland Kitchen lidocaine (LIDODERM) 5 % Place 1 patch onto the skin daily. Remove & Discard patch within 12 hours or as directed by MD       . ondansetron (ZOFRAN) 4 MG tablet Take 4 mg by mouth every 8 (eight) hours as needed.        . solifenacin (VESICARE) 5 MG tablet Take 10 mg by mouth daily.        Marland Kitchen zolpidem (AMBIEN CR) 12.5 MG CR tablet Take 5 mg by mouth at bedtime as needed.         Allergies:  Allergies  Allergen Reactions  . Penicillins      REACTION: Facial swelling    Physical Exam: Filed Vitals:   05/27/11 1401  BP: 119/75  Pulse: 105  Temp: 98.7 F (37.1 C)   HEENT:  Sclerae anicteric, conjunctivae pink.  Oropharynx clear.  No mucositis or candidiasis.   Nodes:  No cervical, supraclavicular, or axillary lymphadenopathy palpated.  Breast Exam:  Deferred   Lungs:  Clear to auscultation bilaterally.  No crackles, rhonchi, or wheezes.   Heart:  Regular rate and rhythm.   Abdomen:  Soft, obese, nontender.  Positive bowel sounds.   Musculoskeletal:  Tenderness to palpation in the lumbar region.   Extremities: 2+ pitting edema in the right upper extremity. Nonpitting edema in the left upper extremity. 1+ pitting edema bilaterally in the lower extremities, slightly greater on the right than the left. Negative Homans sign bilaterally. No palpable cords, or obvious erythema. Skin:  Benign.   Neuro:  Nonfocal.   Lab Results: Lab Results  Component Value Date   WBC 6.9 03/25/2011   HGB 12.2 03/25/2011   HCT 36.5 03/25/2011   MCV 90.8 03/25/2011   PLT 239 03/25/2011   NEUTROABS 4.2 03/25/2011     Chemistry      Component Value Date/Time   NA 140 03/25/2011 1307   K 3.5 03/25/2011 1307   CL 105 03/25/2011 1307   CO2 25 03/25/2011 1307   BUN 11 03/25/2011 1307   CREATININE 0.64 03/25/2011 1307      Component Value Date/Time   CALCIUM 9.1 03/25/2011 1307   ALKPHOS 52 03/25/2011 1307   AST 18 03/25/2011 1307   ALT 16 03/25/2011 1307   BILITOT 0.3 03/25/2011 1307        Radiological Studies:  No results found.   Assessment:  35 year old BRCA1 and BRCA2 negative [place] woman, status post right breast biopsy in March 2011 for a high-grade invasive ductal carcinoma, ER positive 3%, PR positive 14%, strongly HER2/neu positive with MIB-1 of 15%.  Biopsy proven lymph node involvement at presentation.  Changes consistent with inflammatory carcinoma.    1.  Treated neoadjuvantly, status post 4 dose-dense  cycles of doxorubicin/cyclophosphamide, followed by 12 weekly doses of paclitaxel with trastuzumab.   2.  Trastuzumab was then continued for a total of 1 year.  Following 1 year, an echo showed well-preserved ejection fraction.   3. Status post definitive right modified radical mastectomy in October 2011 showing a residual 2.5 mm area of  tumor, grade 3, 1 of 21 lymph nodes involved.  Again, ER positive, PR positive.   4. Received postmastectomy radiation, completed in January 2012, at which time she began on tamoxifen.    5. Now with chronic postsurgical pain in the right chest wall, right axillary region, and right shoulder.  Also with chronic lymphedema in the right upper extremity.   Plan:  The patient will proceed for her Doppler of the upper extremities as scheduled tomorrow. We have added, however, Dopplers of the lower extremities as well. I have asked her to hold the tamoxifen pending those results, and if in fact they are negative, she can resume the tamoxifen at that time.  Also, if the Dopplers were negative for DVT, I would consider referring the patient for repeat echocardiogram, and perhaps a consult with Dr. Gala Romney in light of her history of receiving trastuzumab.  The patient will continue to be followed at the lymphedema clinic, and as noted above, sorry scheduled for followup next Friday, December 28. She has requested a phone call if they have cancellations prior to that date. She returned to see me in 3 weeks, January 8, for her next followup, but will call sooner if necessary.  Finally, the patient would like to consider a referral to Dr. Faith Rogue at the pain clinic for evaluation of chronic pain.  This plan was reviewed with the patient, who voices understanding and agreement.  She knows to call with any changes or problems.    Milbern Doescher, PA-C 05/27/2011

## 2011-05-27 NOTE — Patient Instructions (Signed)
Doppler on 12/21 will evaluate for clots (DVT) - Tamoxifen can increase risk of clotting.  Stop tamoxifen until we have the results of the doppler.  If doppler is negative, we will consider referring you for a repeat echo and to see Dr. Gala Romney (cardiology).  In the meanwhile, decrease sodium intake.  Increase your water intake!  Elevate feet when sitting.  Hopefully, lymphedema clinic can see you sonner than 12/28. If not, keep that appointment!  We will look into an appt with Dr. Riley Kill.

## 2011-05-27 NOTE — Telephone Encounter (Signed)
Called pt. Informed of appointment at [facility] for venous doppler study of her right arm. Arrive at 9:45 am for 10 am appt. Also told pt, that Sleep Study order for her was faxed and they will call her with appt. Lorenda Hatchet, Renato Battles

## 2011-05-27 NOTE — Progress Notes (Signed)
S/w Camilla, vascular lab, [phone], per Zollie Scale, PA re: pt is scheduled for UE doppler tomorrow at [facility] at 10:00 am under Dr. Leveda Anna.  Pt is in office today with BLE swelling, and Amy would like dopplers of BLE's to evaluate this.  Wanted to know if this can be done at the same time?  Per [place], this can be done at the same time, when orders are entered provider needs to override pt's current appt.  Amy, PA aware.

## 2011-05-28 ENCOUNTER — Telehealth: Payer: Self-pay | Admitting: *Deleted

## 2011-05-28 ENCOUNTER — Ambulatory Visit (HOSPITAL_COMMUNITY)
Admission: RE | Admit: 2011-05-28 | Discharge: 2011-05-28 | Disposition: A | Discharge status: Home / Self Care | Payer: Medicaid Other | Source: Ambulatory Visit | Attending: Family Medicine | Admitting: Family Medicine

## 2011-05-28 DIAGNOSIS — I890 Lymphedema, not elsewhere classified: Secondary | ICD-10-CM

## 2011-05-28 DIAGNOSIS — M79609 Pain in unspecified limb: Secondary | ICD-10-CM | POA: Insufficient documentation

## 2011-05-28 DIAGNOSIS — E669 Obesity, unspecified: Secondary | ICD-10-CM

## 2011-05-28 DIAGNOSIS — M7989 Other specified soft tissue disorders: Secondary | ICD-10-CM

## 2011-05-28 NOTE — Telephone Encounter (Signed)
gave patient appointment 2013

## 2011-05-28 NOTE — Progress Notes (Signed)
*  PRELIMINARY RESULTS* No evidence of DTV or superficial thrombus of the Right upper extremity. No evidence of DVT,superficial thrombosis, or Baker's cyst bilaterally in the lower extremities.  Alazae Crymes, [place] D 05/28/2011, 12:16 PM

## 2011-05-31 ENCOUNTER — Telehealth: Payer: Self-pay | Admitting: *Deleted

## 2011-05-31 NOTE — Telephone Encounter (Signed)
Faxed over information to the [facility] at [phone]

## 2011-06-02 ENCOUNTER — Telehealth: Payer: Self-pay | Admitting: Oncology

## 2011-06-02 NOTE — Telephone Encounter (Signed)
Patient received one prescription from [facility] on 05/28/11 $3.00,her remaning balance ALIGHT $8.21

## 2011-06-04 ENCOUNTER — Other Ambulatory Visit: Payer: Self-pay | Admitting: *Deleted

## 2011-06-04 ENCOUNTER — Ambulatory Visit: Payer: Self-pay | Attending: Oncology | Admitting: Physical Therapy

## 2011-06-04 DIAGNOSIS — IMO0001 Reserved for inherently not codable concepts without codable children: Secondary | ICD-10-CM | POA: Insufficient documentation

## 2011-06-04 DIAGNOSIS — I890 Lymphedema, not elsewhere classified: Secondary | ICD-10-CM | POA: Insufficient documentation

## 2011-06-04 DIAGNOSIS — C50419 Malignant neoplasm of upper-outer quadrant of unspecified female breast: Secondary | ICD-10-CM

## 2011-06-04 MED ORDER — ALPRAZOLAM 0.5 MG PO TABS: ORAL_TABLET | ORAL | Status: DC

## 2011-06-04 NOTE — Telephone Encounter (Signed)
Called refill to [facility] (#[phone])

## 2011-06-07 ENCOUNTER — Ambulatory Visit: Payer: Self-pay | Admitting: Physical Therapy

## 2011-06-07 NOTE — Progress Notes (Signed)
  Subjective:    Patient ID: Karen Hopkins, female    DOB: [birth year], 35 y.o.   MRN: [id number]  HPI  1. R arm swelling:  Hx of lympedema in R arm 2/2 to mastectomy with lymph node removal,  but worsened acutely over the past few days.  Called PT clinic at [facility] and was told that this could be a blood clot and recommended she have a doppler of the arm. Has been using compressive sleeve but not helping as much as previously.  Arm mildly painful.  Currently on methadone by hem/onc for pain control because previous narcotic medications were not helping.    2.  Sleepiness while driving:  Has had increased drowsiness over the past two weeks while driving.  If moving around does not feel tired.  Has not fallen asleep at the wheel.  Reports that she wakes frequently at night.  Unsure if she is a loud snorer.  She does take frequent naps during the day.  No dianosis of OSA in the past.  As above she is currently on methadone for pain control but this was started a few months ago before sleepiness symptoms started   Review of Systems     Objective:   Physical Exam  Constitutional: She is oriented to [place], place, and time. No distress.       Obese   HENT:  Head: Normocephalic and atraumatic.  Neck: Neck supple.  Cardiovascular: Normal rate, regular rhythm and normal heart sounds.   Musculoskeletal:       Right upper arm: She exhibits swelling.       Swelling increased in R arm when compared to L.  Upper arm with mild-moderate tendernss.  Pulses intact.  No redness or warmth in arm.   Neurological: She is alert and oriented to [place], place, and time.          Assessment & Plan:

## 2011-06-07 NOTE — Assessment & Plan Note (Signed)
Likely just increase in lymphedema, however there is concern for possible DVT given history of malignancy

## 2011-06-07 NOTE — Assessment & Plan Note (Signed)
Symptoms concerning for possible OSA, will send for for split night sleep study. Other cause could include narcotic use, if sleep study negative consider decrease in methadone.

## 2011-06-09 ENCOUNTER — Ambulatory Visit: Payer: Self-pay | Attending: Oncology

## 2011-06-09 ENCOUNTER — Other Ambulatory Visit: Payer: Self-pay

## 2011-06-09 DIAGNOSIS — I890 Lymphedema, not elsewhere classified: Secondary | ICD-10-CM | POA: Insufficient documentation

## 2011-06-09 DIAGNOSIS — IMO0001 Reserved for inherently not codable concepts without codable children: Secondary | ICD-10-CM | POA: Insufficient documentation

## 2011-06-09 DIAGNOSIS — C50919 Malignant neoplasm of unspecified site of unspecified female breast: Secondary | ICD-10-CM

## 2011-06-09 MED ORDER — GABAPENTIN 300 MG PO CAPS: 300.0000 mg | ORAL_CAPSULE | Freq: Three times a day (TID) | ORAL | Status: DC

## 2011-06-10 ENCOUNTER — Encounter: Payer: Medicaid Other | Admitting: Physical Therapy

## 2011-06-11 ENCOUNTER — Ambulatory Visit: Payer: Self-pay | Admitting: Physical Therapy

## 2011-06-14 ENCOUNTER — Ambulatory Visit: Payer: Self-pay | Admitting: Physical Therapy

## 2011-06-15 ENCOUNTER — Ambulatory Visit: Payer: Medicaid Other | Admitting: Physician Assistant

## 2011-06-15 ENCOUNTER — Other Ambulatory Visit: Payer: Medicaid Other | Admitting: Lab

## 2011-06-16 ENCOUNTER — Other Ambulatory Visit: Payer: Self-pay | Admitting: Physician Assistant

## 2011-06-16 ENCOUNTER — Ambulatory Visit: Payer: Self-pay | Admitting: Physical Therapy

## 2011-06-16 DIAGNOSIS — C50919 Malignant neoplasm of unspecified site of unspecified female breast: Secondary | ICD-10-CM

## 2011-06-16 NOTE — Progress Notes (Signed)
[facility] on 06/15/11.  Orders in to reschedule labs and AB.

## 2011-06-18 ENCOUNTER — Ambulatory Visit: Payer: Self-pay | Admitting: Physical Therapy

## 2011-06-21 ENCOUNTER — Ambulatory Visit: Payer: Self-pay | Admitting: Physical Therapy

## 2011-06-22 ENCOUNTER — Ambulatory Visit (HOSPITAL_BASED_OUTPATIENT_CLINIC_OR_DEPARTMENT_OTHER): Payer: Medicaid Other

## 2011-06-23 ENCOUNTER — Ambulatory Visit: Payer: Self-pay | Admitting: Physical Therapy

## 2011-06-23 ENCOUNTER — Ambulatory Visit (HOSPITAL_BASED_OUTPATIENT_CLINIC_OR_DEPARTMENT_OTHER): Payer: Medicaid Other | Attending: Family Medicine

## 2011-06-23 VITALS — Ht 64.0 in | Wt 263.0 lb

## 2011-06-23 DIAGNOSIS — G4710 Hypersomnia, unspecified: Secondary | ICD-10-CM | POA: Insufficient documentation

## 2011-06-23 DIAGNOSIS — G4733 Obstructive sleep apnea (adult) (pediatric): Secondary | ICD-10-CM

## 2011-06-23 DIAGNOSIS — R400 Somnolence: Secondary | ICD-10-CM

## 2011-06-25 ENCOUNTER — Ambulatory Visit: Payer: Self-pay | Admitting: Physical Therapy

## 2011-06-26 DIAGNOSIS — G4730 Sleep apnea, unspecified: Secondary | ICD-10-CM

## 2011-06-26 DIAGNOSIS — G4710 Hypersomnia, unspecified: Secondary | ICD-10-CM

## 2011-06-26 NOTE — Procedures (Signed)
NAMEANDREA, FERRER NO.:  [id number]  MEDICAL RECORD NO.:  [id number]          PATIENT TYPE:  OUT  LOCATION:  SLEEP CENTER                 FACILITY:  [facility]  PHYSICIAN:  Mayana Irigoyen D. Maple Hudson, MD, FCCP, FACPDATE OF BIRTH:  04/17/[birth year]  DATE OF STUDY:  06/23/2011                           NOCTURNAL POLYSOMNOGRAM  REFERRING PHYSICIAN:  Everrett Coombe, MD  INDICATION FOR STUDY:  Hypersomnia with sleep apnea.  EPWORTH SLEEPINESS SCORE:  Epworth sleepiness score 21/24.  BMI 45.1, weight 263 pounds, height 64 inches, neck 16.5 inches.  MEDICATIONS:  Home medications are charted and reviewed.  SLEEP ARCHITECTURE:  Total sleep time 356 minutes with sleep efficiency 95.3%.  Stage I was 6.6%, stage II 74.2%, stage III, 2.9%, REM 16.3% of total sleep time.  Sleep latency 1 minute, REM latency 118 minutes. Awake after sleep onset 16.5 minutes.  Arousal index 4.9.  Bedtime medication: None.  RESPIRATORY DATA:  Apnea-hypopnea index (AHI) 3 per hour.  A total of 18 events were scored, all as hypopneas associated with supine sleep position, and REM.  REM AHI 11.4 per hour.  There were insufficient numbers of early events to permit application of split protocol, CPAP titration on this study night.  OXYGEN DATA:  Moderate snoring with oxygen desaturation to a nadir of 88% and a mean oxygen saturation through the study of 94.6% on room air.  CARDIAC DATA:  Sinus rhythm.  MOVEMENT-PARASOMNIA:  No significant movement disturbance.  No bathroom trips.  IMPRESSIONS-RECOMMENDATIONS: 1. Unremarkable sleep architecture for sleep study environment 2. Occasional respiratory event with sleep disturbance, within normal     limits.  AHI 3 per hour (the normal range     for adults is from 0-5 events per hour).  Moderate snoring with     oxygen desaturation to a nadir of 88% and a mean oxygen saturation     through the study of 94.6% on room air.     Cataleia Gade D. Maple Hudson, MD, [facility],  FACP Diplomate, Biomedical engineer of Sleep Medicine Electronically Signed    CDY/MEDQ  D:  06/26/2011 11:01:25  T:  06/26/2011 11:50:46  Job:  [id number]

## 2011-06-28 ENCOUNTER — Ambulatory Visit: Payer: Self-pay

## 2011-06-30 ENCOUNTER — Ambulatory Visit (HOSPITAL_BASED_OUTPATIENT_CLINIC_OR_DEPARTMENT_OTHER): Payer: Medicaid Other | Admitting: Physician Assistant

## 2011-06-30 ENCOUNTER — Encounter: Payer: Self-pay | Admitting: Physician Assistant

## 2011-06-30 ENCOUNTER — Other Ambulatory Visit (HOSPITAL_BASED_OUTPATIENT_CLINIC_OR_DEPARTMENT_OTHER): Payer: Medicaid Other | Admitting: Lab

## 2011-06-30 ENCOUNTER — Telehealth: Payer: Self-pay | Admitting: Oncology

## 2011-06-30 ENCOUNTER — Ambulatory Visit: Payer: Self-pay | Admitting: Physical Therapy

## 2011-06-30 VITALS — BP 119/85 | HR 91 | Temp 97.8°F | Ht 64.0 in | Wt 265.2 lb

## 2011-06-30 DIAGNOSIS — C50919 Malignant neoplasm of unspecified site of unspecified female breast: Secondary | ICD-10-CM

## 2011-06-30 DIAGNOSIS — G8929 Other chronic pain: Secondary | ICD-10-CM

## 2011-06-30 DIAGNOSIS — I890 Lymphedema, not elsewhere classified: Secondary | ICD-10-CM

## 2011-06-30 DIAGNOSIS — Z170 Estrogen receptor positive status [ER+]: Secondary | ICD-10-CM

## 2011-06-30 LAB — CBC WITH DIFFERENTIAL/PLATELET
BASO%: 0.3 % (ref 0.0–2.0)
Basophils Absolute: 0 10*3/uL (ref 0.0–0.1)
EOS%: 5.3 % (ref 0.0–7.0)
MCH: 29.8 pg (ref 25.1–34.0)
MCHC: 33.3 g/dL (ref 31.5–36.0)
MCV: 89.4 fL (ref 79.5–101.0)
MONO%: 6.8 % (ref 0.0–14.0)
RBC: 4.27 10*6/uL (ref 3.70–5.45)
RDW: 14.5 % (ref 11.2–14.5)
lymph#: 2.4 10*3/uL (ref 0.9–3.3)

## 2011-06-30 MED ORDER — CELECOXIB 100 MG PO CAPS: 100.0000 mg | ORAL_CAPSULE | Freq: Two times a day (BID) | ORAL | Status: AC

## 2011-06-30 NOTE — Telephone Encounter (Signed)
gve the pt her march 2013 appt calendar along with the mammo appt in april

## 2011-06-30 NOTE — Progress Notes (Signed)
Hematology and Oncology Follow Up Visit  Karen Hopkins [id number] 1975-06-19 36 y.o. 06/30/2011    HPI: The patient noted swelling and redness, as well as some tenderness, over her right breast for some time.  She thought this might be related to pregnancy, since she delivered her third child about 8 months ago.  As the problem did not improve, she brought it to her primary care physician's attention.  He treated her with Bactrim, which she says she could not tolerate because of nausea and vomiting, but which in any case did not resolve the problem, so he set her up for mammography at [facility] 08/26/2009.  Dr. Deboraha Hopkins found by exam marked skin thickening and erythema of the right breast extending pretty much throughout the breast.  By mammography there were scattered fibroglandular densities, and an asymmetric density in the right upper outer quadrant with suspicious microcalcifications.  There were also enlarged right axillary lymph nodes.  The left appeared normal.  By ultrasound there was an ill defined area of hypoechoic tissue in the right upper outer quadrant that was difficult to measure.  There were also abnormal right axillary lymph nodes.    Ultrasound guided biopsy was performed the same day, and showed [phone]) a poorly differentiated invasive ductal carcinoma, which was estrogen receptor poor at 3%, progesterone receptor positive at 14% with an elevated proliferation marker at 50%, but importantly with strong amplification of HER-2 by CISH with a ratio of 5.20.  With this information, the patient was referred to Dr. Jamey Hopkins, and bilateral breast MRIs were obtained 09/02/2009.  This showed an area up to 10.6 cm in the right breast showing confluent mass and non-mass enhancement.  There was marked skin thickening involving the entire right breast, and numerous bulky right axillary lymph nodes were identified.  The left breast was unremarkable, and there was no evidence of internal  mammary lymph node involvement.  Patient was treated in the neoadjuvant setting with 4 dose dense cycles of doxorubicin and cyclophosphamide, followed by 12 weekly doses of paclitaxel and trastuzumab. Trastuzumab was continued for a total of one year. Following one year, an echocardiogram did in fact show well preserved ejection fraction.  Patient status post definitive right modified radical mastectomy in October 2011, with a residual 2.5 mm area of tumor, grade 3, with one of 21 lymph nodes involved. Tumor was again ER and PR positive.  Received postmastectomy radiation, completed in January 2012, at which time she began on tamoxifen at 20 mg daily.  Patient is known to be BRCA1 and BRCA2 negative.  Patient has a history of chronic postsurgical pain in the right chest wall, right axillary region, and right shoulder, in addition to chronic lymphedema in the right upper extremity.  Interim History:   Karen Hopkins returns today for followup of her right breast carcinoma. Interval history is remarkable for continued visits at the lymphedema clinic for chronic lymphedema in the right upper [place]. They are wrapping the arm, have ordered her a new sleeve, and this all does seem to be helping.  When Karen Hopkins was last seen here in the summer, she was also having increased swelling in the lower extremities. We discontinued her tamoxifen briefly, obtained Dopplers of the upper and lower extremities, and fortunately all of these were negative for DVT. She resumed tamoxifen accordingly in late summer. The edema in the lower extremities has cleared completely. (In fact she tells me she has lost about 10 pounds.) She denies any increased shortness of breath,  other than some mild shortness of breath with exertion which is not new. She's had no orthopnea. No increased cough.  Her biggest complaint continues to be chronic pain in the right upper extremity, right chest wall, and right axilla, status post surgery. It had a  very difficult time getting her referred and scheduled to a pain clinic, and continue to work on this. We are currently trying a referral to [facility]. Karen Hopkins continues on gabapentin, 300 mg 3 times daily, and tries to take naproxen which she finds only minimally helpful. She's discontinued methadone because it made her feel bad, and made her extremely sleepy. She is also status post sleep study this past week to evaluate for sleep apnea. Those results are pending.  A detailed review of systems is otherwise noncontributory as noted below.  Review of Systems: Constitutional:  fatigued and generally weak, no fever Eyes: negative [id number] Cardiovascular: positive for - dyspnea on exertion, negative for - chest pain or irregular heartbeat, orthopnea Respiratory: negative for - cough, increased shortness of breath, hemoptysis or pleuritic pain Neurological: negative Dermatological: negative Gastrointestinal: no abdominal pain, change in bowel habits, or black or bloody stools Genito-Urinary: no dysuria, trouble voiding, or hematuria Hematological and Lymphatic: negative Breast: negative Musculoskeletal: positive for - joint pain and muscle pain, edema in right upper extremity Remaining ROS negative.  FAMILY HISTORY:  The patient's father is alive at age 81.  The patient's mother is alive at age 39.  The patient has two sisters, Karen Hopkins and Karen Hopkins. The patient has no brothers.  There is no history of breast or ovarian cancer in the immediate family, but of the patient's mother's mother's six sisters, two (the patient's great-aunts) had ovarian cancer.  GYNECOLOGIC HISTORY:  The patient is GX, P3.  First child was premature.  Age at first delivery, 36 years old.    SOCIAL HISTORY:  She is currently disabled.  [name]'s children are Karen Hopkins, and Karen Hopkins.  They are all boys, all at home.  The patient attends a non-denominational church in [place], which she considers her home, but  she is currently living in [place].  Medications:   I have reviewed the patient's current medications.  Current Outpatient Prescriptions  Medication Sig Dispense Refill  . ALPRAZolam (XANAX) 0.5 MG tablet ALPRAZOLAM XR  TAKE ONE TAB TWICE A DAY  60 tablet  0  . citalopram (CELEXA) 20 MG tablet Take 20 mg by mouth daily.        Marland Kitchen gabapentin (NEURONTIN) 300 MG capsule Take 1 capsule (300 mg total) by mouth 3 (three) times daily. Prescribed by oncologist  90 capsule  3  . lidocaine (LIDODERM) 5 % Place 1 patch onto the skin daily. Remove & Discard patch within 12 hours or as directed by MD       . tamoxifen (NOLVADEX) 20 MG tablet Take 20 mg by mouth 2 (two) times daily.        Marland Kitchen triamterene-hydrochlorothiazide (MAXZIDE-25) 37.5-25 MG per tablet Take 1 tablet by mouth daily.        . celecoxib (CELEBREX) 100 MG capsule Take 1 capsule (100 mg total) by mouth 2 (two) times daily.  60 capsule  2  . cetirizine (ZYRTEC) 10 MG tablet Take 10 mg by mouth daily.        . ondansetron (ZOFRAN) 4 MG tablet Take 4 mg by mouth every 8 (eight) hours as needed.        . zolpidem (AMBIEN CR) 12.5 MG CR tablet  Take 5 mg by mouth at bedtime as needed.         Allergies:  Allergies  Allergen Reactions  . Penicillins     REACTION: Facial swelling    Physical Exam: Filed Vitals:   06/30/11 1324  BP: 119/85  Pulse: 91  Temp: 97.8 F (36.6 C)   HEENT:  Sclerae anicteric, conjunctivae pink.  Oropharynx clear.  No mucositis or candidiasis.   Nodes:  No cervical, supraclavicular, or axillary lymphadenopathy palpated.  Breast Exam:  Status post right mastectomy. Well-healed incision, no suspicious nodularity. No evidence of local recurrence in the chest wall. There is some tenderness to palpation in the general area. Left breast is benign, no masses, skin changes, or nipple inversion. Lungs:  Clear to auscultation bilaterally.  No crackles, rhonchi, or wheezes.   Heart:  Regular rate and rhythm.     Abdomen:  Soft, obese, nontender.  Positive bowel sounds.   Musculoskeletal:  Tenderness to palpation in the lumbar region.   Extremities: Lymphedema in the right upper extremity, arm currently wrapped with an ACE bandage. No additional peripheral edema noted. No peripheral cyanosis. Skin:  Benign.   Neuro:  Nonfocal.   Lab Results: Lab Results  Component Value Date   WBC 8.8 06/30/2011   HGB 12.7 06/30/2011   HCT 38.2 06/30/2011   MCV 89.4 06/30/2011   PLT 263 06/30/2011   NEUTROABS 5.4 06/30/2011     Chemistry      Component Value Date/Time   NA 140 03/25/2011 1307   K 3.5 03/25/2011 1307   CL 105 03/25/2011 1307   CO2 25 03/25/2011 1307   BUN 11 03/25/2011 1307   CREATININE 0.64 03/25/2011 1307      Component Value Date/Time   CALCIUM 9.1 03/25/2011 1307   ALKPHOS 52 03/25/2011 1307   AST 18 03/25/2011 1307   ALT 16 03/25/2011 1307   BILITOT 0.3 03/25/2011 1307        Radiological Studies:  No results found.   Assessment:  36 year old BRCA1 and BRCA2 negative [place] woman, status post right breast biopsy in March 2011 for a high-grade invasive ductal carcinoma, ER positive 3%, PR positive 14%, strongly HER2/neu positive with MIB-1 of 15%.  Biopsy proven lymph node involvement at presentation.  Changes consistent with inflammatory carcinoma.    1.  Treated neoadjuvantly, status post 4 dose-dense cycles of doxorubicin/cyclophosphamide, followed by 12 weekly doses of paclitaxel with trastuzumab.   2.  Trastuzumab was then continued for a total of 1 year.  Following 1 year, an echo showed well-preserved ejection fraction.   3. Status post definitive right modified radical mastectomy in October 2011 showing a residual 2.5 mm area of tumor, grade 3, 1 of 21 lymph nodes involved.  Again, ER positive, PR positive.   4. Received postmastectomy radiation, completed in January 2012, at which time she began on tamoxifen.    5. Now with chronic postsurgical pain in the  right chest wall, right axillary region, and right shoulder.  Also with chronic lymphedema in the right upper extremity.   Plan:  Overall, Naveya continues to do well with the exception of her chronic pain, and right upper extremity lymphedema. She'll continue to be seen at the lymphedema clinic. She'll also continue on her tamoxifen as before, 20 mg daily, which she is tolerating well.  Regards to the chronic pain, we are going to try Celebrex in place of the naproxen, 100 mg by mouth twice a day. She will continue on the  gabapentin, 300 mg by mouth 3 times a day as well. We'll continue trying to work on referral to a local pain clinic, and I will see Jasmynn back in approximately 6-8 weeks for reevaluation.   This plan was reviewed with the patient, who voices understanding and agreement.  She knows to call with any changes or problems.    Zollie Scale, PA-C 06/30/2011

## 2011-07-01 ENCOUNTER — Ambulatory Visit: Payer: Self-pay | Admitting: Physical Therapy

## 2011-07-01 ENCOUNTER — Encounter: Payer: Medicaid Other | Admitting: Physical Therapy

## 2011-07-05 ENCOUNTER — Encounter: Payer: Self-pay | Admitting: *Deleted

## 2011-07-05 ENCOUNTER — Ambulatory Visit: Payer: Self-pay | Admitting: Physical Therapy

## 2011-07-05 ENCOUNTER — Encounter: Payer: Self-pay | Admitting: Oncology

## 2011-07-05 ENCOUNTER — Other Ambulatory Visit: Payer: Self-pay | Admitting: *Deleted

## 2011-07-05 DIAGNOSIS — C50419 Malignant neoplasm of upper-outer quadrant of unspecified female breast: Secondary | ICD-10-CM

## 2011-07-05 MED ORDER — ALPRAZOLAM 0.5 MG PO TABS: ORAL_TABLET | ORAL | Status: DC

## 2011-07-05 MED ORDER — TAMOXIFEN CITRATE 20 MG PO TABS: 20.0000 mg | ORAL_TABLET | Freq: Every day | ORAL | Status: DC

## 2011-07-05 NOTE — Progress Notes (Signed)
RECEIVED A FAX FROM [facility] CONCERNING A PRIOR AUTHORIZATION FOR CELEBREX. THIS REQUEST WAS PLACED IN THE MANAGED CARE BIN.

## 2011-07-05 NOTE — Progress Notes (Signed)
Put medicaid pa form for celebrex on nurse's desk.

## 2011-07-06 ENCOUNTER — Other Ambulatory Visit: Payer: Self-pay | Admitting: Oncology

## 2011-07-07 ENCOUNTER — Ambulatory Visit: Payer: Self-pay | Admitting: Physical Therapy

## 2011-07-08 ENCOUNTER — Ambulatory Visit: Payer: Self-pay | Admitting: Physical Therapy

## 2011-07-12 ENCOUNTER — Other Ambulatory Visit: Payer: Self-pay | Admitting: *Deleted

## 2011-07-12 ENCOUNTER — Encounter: Payer: Self-pay | Admitting: *Deleted

## 2011-07-12 ENCOUNTER — Encounter: Payer: Self-pay | Admitting: Oncology

## 2011-07-12 ENCOUNTER — Ambulatory Visit: Payer: Self-pay | Attending: Oncology | Admitting: Physical Therapy

## 2011-07-12 DIAGNOSIS — IMO0001 Reserved for inherently not codable concepts without codable children: Secondary | ICD-10-CM | POA: Insufficient documentation

## 2011-07-12 DIAGNOSIS — I890 Lymphedema, not elsewhere classified: Secondary | ICD-10-CM | POA: Insufficient documentation

## 2011-07-12 NOTE — Progress Notes (Signed)
rec'd call from [facility] stating that Celebrex has been denied by insurance co

## 2011-07-12 NOTE — Progress Notes (Unsigned)
Rec'd call from [facility], stating that Celebrex has been denied

## 2011-07-12 NOTE — Progress Notes (Signed)
Medicaid has denied celebrex, informed nurse.

## 2011-07-14 ENCOUNTER — Encounter: Payer: Medicaid Other | Admitting: Physical Therapy

## 2011-07-16 ENCOUNTER — Ambulatory Visit: Payer: Self-pay | Admitting: Physical Therapy

## 2011-07-19 ENCOUNTER — Ambulatory Visit: Payer: Self-pay | Admitting: Physical Therapy

## 2011-07-21 ENCOUNTER — Ambulatory Visit: Payer: Medicaid Other | Admitting: Family Medicine

## 2011-07-21 ENCOUNTER — Ambulatory Visit: Payer: Self-pay | Admitting: Physical Therapy

## 2011-07-23 ENCOUNTER — Ambulatory Visit: Payer: Self-pay | Admitting: Physical Therapy

## 2011-07-26 ENCOUNTER — Ambulatory Visit: Payer: Self-pay | Admitting: Physical Therapy

## 2011-07-26 ENCOUNTER — Ambulatory Visit: Payer: Medicaid Other | Admitting: Physical Medicine & Rehabilitation

## 2011-07-26 ENCOUNTER — Encounter: Payer: Medicaid Other | Attending: Physical Medicine & Rehabilitation

## 2011-07-26 DIAGNOSIS — IMO0001 Reserved for inherently not codable concepts without codable children: Secondary | ICD-10-CM | POA: Insufficient documentation

## 2011-07-26 DIAGNOSIS — M25579 Pain in unspecified ankle and joints of unspecified foot: Secondary | ICD-10-CM | POA: Insufficient documentation

## 2011-07-26 DIAGNOSIS — Z9221 Personal history of antineoplastic chemotherapy: Secondary | ICD-10-CM | POA: Insufficient documentation

## 2011-07-26 DIAGNOSIS — M7500 Adhesive capsulitis of unspecified shoulder: Secondary | ICD-10-CM

## 2011-07-26 DIAGNOSIS — C50919 Malignant neoplasm of unspecified site of unspecified female breast: Secondary | ICD-10-CM | POA: Insufficient documentation

## 2011-07-26 DIAGNOSIS — Z9010 Acquired absence of unspecified breast and nipple: Secondary | ICD-10-CM | POA: Insufficient documentation

## 2011-07-26 DIAGNOSIS — M25569 Pain in unspecified knee: Secondary | ICD-10-CM | POA: Insufficient documentation

## 2011-07-26 DIAGNOSIS — F4322 Adjustment disorder with anxiety: Secondary | ICD-10-CM | POA: Insufficient documentation

## 2011-07-26 DIAGNOSIS — G893 Neoplasm related pain (acute) (chronic): Secondary | ICD-10-CM

## 2011-07-26 DIAGNOSIS — Z923 Personal history of irradiation: Secondary | ICD-10-CM | POA: Insufficient documentation

## 2011-07-26 DIAGNOSIS — M722 Plantar fascial fibromatosis: Secondary | ICD-10-CM | POA: Insufficient documentation

## 2011-07-26 DIAGNOSIS — R209 Unspecified disturbances of skin sensation: Secondary | ICD-10-CM | POA: Insufficient documentation

## 2011-07-26 DIAGNOSIS — R071 Chest pain on breathing: Secondary | ICD-10-CM | POA: Insufficient documentation

## 2011-07-26 DIAGNOSIS — F4321 Adjustment disorder with depressed mood: Secondary | ICD-10-CM | POA: Insufficient documentation

## 2011-07-26 DIAGNOSIS — M76899 Other specified enthesopathies of unspecified lower limb, excluding foot: Secondary | ICD-10-CM

## 2011-07-26 DIAGNOSIS — M549 Dorsalgia, unspecified: Secondary | ICD-10-CM | POA: Insufficient documentation

## 2011-07-26 DIAGNOSIS — R29898 Other symptoms and signs involving the musculoskeletal system: Secondary | ICD-10-CM | POA: Insufficient documentation

## 2011-07-26 DIAGNOSIS — M79609 Pain in unspecified limb: Secondary | ICD-10-CM | POA: Insufficient documentation

## 2011-07-26 DIAGNOSIS — M2240 Chondromalacia patellae, unspecified knee: Secondary | ICD-10-CM | POA: Insufficient documentation

## 2011-07-27 NOTE — Progress Notes (Signed)
REASON FOR CONSULTATION:  Pain problems related to neoplasm.  A 36 year old female with multiple complaints.  Her chief complaint is right anterior chest wall pain.  She has secondary complaints of upper back and mid back pain, right greater than left.  She has decreased shoulder range.  She has pain in both knees as well as pain in both feet.  She has a history of breast carcinoma and has undergone mastectomy, postmastectomy radiation, which was completed in January 2012, and then begun on tamoxifen.  She has had four cycles of doxorubicin and cyclophosphamide followed by 12 weekly doses of paclitaxel with trastuzumab.  She has had problems with lymphedema in the right upper extremity.  She has been going through outpatient therapy, both PT and OT.  Her last oncology note revealed that she has stable disease.  She has had a bone scan on September 10, 2009, showing no metastatic disease involving the bones or pelvis.  Her pain score is 8/10, interferes with activity at a severe level. Pain is worse with bending, sitting, standing.  She is independent with all her self-care and mobility.  She can walk 20 minutes at a time.  She can climb steps.  She can drive.  She has some numbness and tingling at times, depression, anxiety, constipation, high blood sugar, weight gain.  PAST SURGICAL HISTORY:  As noted, mastectomy, on February 09, 2010.  SOCIAL HISTORY:  Single with 3 kids, nonsmoker, nondrinker.  No drugs.  OBJECTIVE:  VITAL SIGNS:  Blood pressure 158/86, pulse 108, respirations 16, O2 sat 96% on room air, weight 266 pounds.  She used to be 180 pounds, 3 years ago, height 5 feet 4 inches. GENERAL:  This is obese female, in no acute distress.  Mood and affect are appropriate.  Orientation x3.  She has significant lymphedema in the right upper extremity, and is wrapped from the shoulder down to her fingers with compressive garments.  Her right chest wall has post radiation skin  changes. MUSCULOSKELETAL:  She has pain with palpation and limited range of motion of the pectoralis scar down quite significantly.  She has decreased internal external rotation of the shoulder.  She abducts to about 90 degrees.  She has tenderness to palpation, right upper trapezius as well as right levator scapula and right infraspinatus.  In the knee, she has no evidence of effusion.  She has tenderness around the patella.  She has no pain in the popliteal fossa.  She has full range of motion, positive crepitus.  In the feet, she has tenderness to palpation in the plantar surface of the calcaneus.  This extends slightly into the midfoot region.  She has mild pain with ankle dorsiflexion.  She does have some mildly reduced range of motion and passive dorsiflexion on the right and left lower extremity.  Gait shows no evidence of toe drag or knee instability.  Left upper extremity strength range of motion are all normal.  Lower extremity strength, hip flexion, knee extension, ankle dorsiflexor, are all normal within range of motion limits.  IMPRESSION: 1. Neoplasm related pain.  She has radiation scarring effecting the     right pectoralis area.  This is contributing to a frozen shoulder.     I would recommend physical therapy desensitization range of motion.     I have written for some lidocaine gel to be applied both before and     after therapy. 2. Myofascial pain, right trapezius, right levator scapula, right     infraspinatus.  I think this is more or less due to muscle     substitution, poor posture related to her lymphedema as well as her     frozen shoulder on the right side.  We will do trigger point     injections today and myofascial release through physical therapy as     well as upper back strengthening scapular stabilization. 3. Chondromalacia of patella bilaterally.  Emphasize weight loss, have     written for Voltaren gel. 4. Plantar fasciitis.  She will need  stretching of Achilles as well as     her plantar fascia.  May need Cam Walker boot, if this is not     helpful for 6 weeks. 5. Depression, anxiety related to her chronic illness.  She has not     followed through with the appointment for Dr. Noe Gens, [facility]     [facility]. 6. I will see her back in about 6 weeks after she gets some further     therapy.  We will do trigger point injections today.     Erick Colace, M.D. Electronically Signed    AEK/MedQ D:  07/27/2011 10:53:36  T:  07/27/2011 19:52:49  Job #:  [id number]  cc:   Lowella Dell, M.D. Fax: [phone]

## 2011-07-28 ENCOUNTER — Ambulatory Visit: Payer: Self-pay | Admitting: Physical Therapy

## 2011-07-30 ENCOUNTER — Ambulatory Visit: Payer: Self-pay | Admitting: Physical Therapy

## 2011-08-02 ENCOUNTER — Ambulatory Visit: Payer: Self-pay | Admitting: Physical Therapy

## 2011-08-02 NOTE — Procedures (Signed)
NAMEFLANNERY, CAVALLERO NO.:  [id number]  MEDICAL RECORD NO.:  [id number]           PATIENT TYPE:  LOCATION:                                 FACILITY:  PHYSICIAN:  Erick Colace, M.D.DATE OF BIRTH:  14-Dec-1975  DATE OF PROCEDURE: DATE OF DISCHARGE:                              OPERATIVE REPORT  PROCEDURE:  Trigger point injection.  INDICATION:  Muscular pain, only partially relieved by physical therapy and medication management, interfering with activities.  Informed consent was obtained after describing risks and benefits of the procedure with the patient.  These include bleeding, bruising, and infection.  She elects to proceed.  One area of the upper trap was marked.  One area of the levator scapula, superior medial to the scapular border and one area in the infraspinatus fossa were marked, prepped with Betadine and alcohol, entered with 25-gauge inch and half needle, 1 mL of 1% lidocaine injected at each site.  The patient tolerated procedure well.  Postprocedure instructions given.     Erick Colace, M.D. Electronically Signed    AEK/MEDQ  D:  07/27/2011 10:54:41  T:  07/27/2011 19:59:00  Job:  [id number]

## 2011-08-05 ENCOUNTER — Other Ambulatory Visit: Payer: Self-pay

## 2011-08-05 DIAGNOSIS — C50419 Malignant neoplasm of upper-outer quadrant of unspecified female breast: Secondary | ICD-10-CM

## 2011-08-05 MED ORDER — ALPRAZOLAM 0.5 MG PO TABS: ORAL_TABLET | ORAL | Status: DC

## 2011-08-09 ENCOUNTER — Ambulatory Visit: Payer: Self-pay | Attending: Physical Medicine & Rehabilitation | Admitting: Physical Therapy

## 2011-08-09 DIAGNOSIS — R293 Abnormal posture: Secondary | ICD-10-CM | POA: Insufficient documentation

## 2011-08-09 DIAGNOSIS — IMO0001 Reserved for inherently not codable concepts without codable children: Secondary | ICD-10-CM | POA: Insufficient documentation

## 2011-08-09 DIAGNOSIS — M25619 Stiffness of unspecified shoulder, not elsewhere classified: Secondary | ICD-10-CM | POA: Insufficient documentation

## 2011-08-09 DIAGNOSIS — M25519 Pain in unspecified shoulder: Secondary | ICD-10-CM | POA: Insufficient documentation

## 2011-08-11 ENCOUNTER — Ambulatory Visit: Payer: Self-pay | Admitting: Physical Therapy

## 2011-08-13 ENCOUNTER — Telehealth: Payer: Self-pay | Admitting: Physical Medicine & Rehabilitation

## 2011-08-13 MED ORDER — NORTRIPTYLINE HCL 10 MG PO CAPS: 10.0000 mg | ORAL_CAPSULE | Freq: Every day | ORAL | Status: DC

## 2011-08-13 NOTE — Telephone Encounter (Signed)
Meds that were given to her are not working.  She gave it some time, but they are not working, not helping with the pain.  Will Dr give her something else?  Something for pain and sleeplessness?

## 2011-08-13 NOTE — Telephone Encounter (Signed)
From what I can tell she was given Lidocaine gel to be applied to her shoulder. Please advise.

## 2011-08-13 NOTE — Telephone Encounter (Signed)
I sent nortriptyline 10 mg to her pharmacy should help with sleep and pain

## 2011-08-16 ENCOUNTER — Encounter: Payer: Medicaid Other | Admitting: Physical Therapy

## 2011-08-16 MED ORDER — MELOXICAM 7.5 MG PO TABS: 7.5000 mg | ORAL_TABLET | Freq: Every day | ORAL | Status: DC

## 2011-08-16 NOTE — Telephone Encounter (Signed)
Pt states that she is already taking Nortriptyline 25mg . She doesn't want to take the 10mg  because she knows it won't help.

## 2011-08-18 ENCOUNTER — Ambulatory Visit: Payer: Self-pay | Admitting: Physical Therapy

## 2011-08-23 ENCOUNTER — Encounter: Payer: Medicaid Other | Admitting: Physical Therapy

## 2011-08-24 ENCOUNTER — Ambulatory Visit: Payer: Medicaid Other

## 2011-08-25 ENCOUNTER — Ambulatory Visit: Payer: Self-pay | Admitting: Physical Therapy

## 2011-08-30 ENCOUNTER — Ambulatory Visit: Payer: Self-pay | Admitting: Physical Therapy

## 2011-08-31 ENCOUNTER — Telehealth: Payer: Self-pay | Admitting: *Deleted

## 2011-08-31 ENCOUNTER — Other Ambulatory Visit: Payer: Medicaid Other | Admitting: Lab

## 2011-08-31 ENCOUNTER — Encounter: Payer: Self-pay | Admitting: Physician Assistant

## 2011-08-31 ENCOUNTER — Ambulatory Visit (HOSPITAL_BASED_OUTPATIENT_CLINIC_OR_DEPARTMENT_OTHER): Payer: Medicaid Other | Admitting: Physician Assistant

## 2011-08-31 VITALS — BP 154/89 | HR 105 | Temp 98.0°F | Ht 64.0 in | Wt 264.8 lb

## 2011-08-31 DIAGNOSIS — C50919 Malignant neoplasm of unspecified site of unspecified female breast: Secondary | ICD-10-CM

## 2011-08-31 LAB — CBC WITH DIFFERENTIAL/PLATELET
BASO%: 0.4 % (ref 0.0–2.0)
EOS%: 4.7 % (ref 0.0–7.0)
HCT: 37.9 % (ref 34.8–46.6)
LYMPH%: 26.4 % (ref 14.0–49.7)
MCH: 29.4 pg (ref 25.1–34.0)
MCHC: 33.3 g/dL (ref 31.5–36.0)
NEUT%: 63.4 % (ref 38.4–76.8)
Platelets: 264 10*3/uL (ref 145–400)
RBC: 4.29 10*6/uL (ref 3.70–5.45)
lymph#: 2.2 10*3/uL (ref 0.9–3.3)

## 2011-08-31 LAB — COMPREHENSIVE METABOLIC PANEL
AST: 16 U/L (ref 0–37)
Alkaline Phosphatase: 69 U/L (ref 39–117)
BUN: 14 mg/dL (ref 6–23)
Creatinine, Ser: 0.59 mg/dL (ref 0.50–1.10)

## 2011-08-31 NOTE — Progress Notes (Signed)
ID: Karen Hopkins   DOB: 08-Jan-1976  MR#: [id number]  WUJ#:[id number]  HISTORY OF PRESENT ILLNESS: The patient noted swelling and redness, as well as some tenderness, over her right breast for some time. She thought this might be related to pregnancy, since she delivered her third child about 8 months ago. As the problem did not improve, she brought it to her primary care physician's attention. He treated her with Bactrim, which she says she could not tolerate because of nausea and vomiting, but which in any case did not resolve the problem, so he set her up for mammography at [facility] 08/26/2009. Dr. Deboraha Hopkins found by exam marked skin thickening and erythema of the right breast extending pretty much throughout the breast. By mammography there were scattered fibroglandular densities, and an asymmetric density in the right upper outer quadrant with suspicious microcalcifications. There were also enlarged right axillary lymph nodes. The left appeared normal. By ultrasound there was an ill defined area of hypoechoic tissue in the right upper outer quadrant that was difficult to measure. There were also abnormal right axillary lymph nodes.  Ultrasound guided biopsy was performed the same day, and showed [phone]) a poorly differentiated invasive ductal carcinoma, which was estrogen receptor poor at 3%, progesterone receptor positive at 14% with an elevated proliferation marker at 50%, but importantly with strong amplification of HER-2 by CISH with a ratio of 5.20.  With this information, the patient was referred to Dr. Jamey Hopkins, and bilateral breast MRIs were obtained 09/02/2009. This showed an area up to 10.6 cm in the right breast showing confluent mass and non-mass enhancement. There was marked skin thickening involving the entire right breast, and numerous bulky right axillary lymph nodes were identified. The left breast was unremarkable, and there was no evidence of internal mammary lymph node  involvement.  Patient was treated in the neoadjuvant setting with 4 dose dense cycles of doxorubicin and cyclophosphamide, followed by 12 weekly doses of paclitaxel and trastuzumab. Trastuzumab was continued for a total of one year. Following one year, an echocardiogram did in fact show well preserved ejection fraction.  Patient status post definitive right modified radical mastectomy in October 2011, with a residual 2.5 mm area of tumor, grade 3, with one of 21 lymph nodes involved. Tumor was again ER and PR positive.  Received postmastectomy radiation, completed in January 2012, at which time she began on tamoxifen at 20 mg daily.  Patient is known to be BRCA1 and BRCA2 negative.  Patient has a history of chronic postsurgical pain in the right chest wall, right axillary region, and right shoulder, in addition to chronic lymphedema in the right upper extremity.  INTERVAL HISTORY: Karen Hopkins returns today for followup of her right breast carcinoma. She continues on tamoxifen which she is tolerating well. Interval history is right the fact that Karen Hopkins was finally able to get an appointment a local pain clinic and is being evaluated there regularly. Unfortunately, she still has quite a bit of pain in the right upper chest wall, right shoulder, right arm, and right upper back. They are continuing to adjust disease medications, and she is scheduled to followup with them again next Monday. She continues to be seen at the lymphedema clinic for physical therapy as well. She is wearing a compression sleeve and glove which she finds helpful on the right upper extremity.  REVIEW OF SYSTEMS: Karen Hopkins has only occasional hot flashes. She has some mild vaginal discharge, clear and nonodorous. She had 1 menstrual cycle approximately  2 months ago after receiving some cortisone injections in her shoulder, but has had no additional vaginal bleeding. No change in vision. No evidence of abnormal clotting. She continues to have  fatigue. She has mild depression but denies any feelings of hopelessness or suicidal ideations. She continues to have swelling in the right upper extremity as noted above, but is getting some relief with a compression sleeve. She's had no additional peripheral swelling. No abnormal headaches or dizziness. No nausea or change in vision, and no increased shortness of breath.  A detailed review of systems is otherwise noncontributory.  PAST MEDICAL HISTORY: Past Medical History  Diagnosis Date  . Breast cancer stage 3    radiation/chemo/ right mastectomy  . Seasonal allergies     PAST SURGICAL HISTORY: Past Surgical History  Procedure Date  . Mastectomy right sided complete  . Hernia repair 2006  . Nasal septum surgery     FAMILY HISTORY Family History  Problem Relation Age of Onset  . Hypertension Mother   . Diabetes type II Father   . Prostate cancer Father   There is no history of breast or ovarian cancer in the immediate family, but of the patient's mother's mother's six sisters, two (the patient's great-aunts) had ovarian cancer.  GYNECOLOGIC HISTORY: The patient is GX, P3. First child was premature. Age at first delivery, 36 years old.   SOCIAL HISTORY: She is currently disabled. Karen Hopkins's children are Karen Hopkins, and Karen Hopkins. They are all boys, all at home. The patient attends a non-denominational church in [place], which she considers her home, but she is currently living in [place].   ADVANCED DIRECTIVES:  HEALTH MAINTENANCE: History  Substance Use Topics  . Smoking status: Former Smoker    Types: Cigarettes    Quit date: 04/26/2010  . Smokeless tobacco: Not on file  . Alcohol Use: No     Colonoscopy:  PAP:  Bone density:  Lipid panel:  Allergies  Allergen Reactions  . Penicillins     REACTION: Facial swelling    Current Outpatient Prescriptions  Medication Sig Dispense Refill  . ALPRAZolam (XANAX) 0.5 MG tablet ALPRAZOLAM XR  TAKE ONE TAB  TWICE A DAY  60 tablet  0  . citalopram (CELEXA) 20 MG tablet Take 20 mg by mouth daily.        Marland Kitchen desloratadine (CLARINEX) 5 MG tablet Take 5 mg by mouth daily.      . diclofenac sodium (VOLTAREN) 1 % GEL Apply 1 application topically 4 (four) times daily.      Marland Kitchen gabapentin (NEURONTIN) 300 MG capsule Take 1 capsule (300 mg total) by mouth 3 (three) times daily. Prescribed by oncologist  90 capsule  3  . lidocaine (LIDODERM) 5 % Place 1 patch onto the skin daily. Remove & Discard patch within 12 hours or as directed by MD       . meloxicam (MOBIC) 7.5 MG tablet Take 1 tablet (7.5 mg total) by mouth daily.  30 tablet  1  . tamoxifen (NOLVADEX) 20 MG tablet Take 1 tablet (20 mg total) by mouth daily.  90 tablet  0  . traMADol (ULTRAM) 50 MG tablet Take 50 mg by mouth every 6 (six) hours as needed.      . triamterene-hydrochlorothiazide (MAXZIDE-25) 37.5-25 MG per tablet Take 1 tablet by mouth daily.        . cetirizine (ZYRTEC) 10 MG tablet Take 10 mg by mouth daily.        . nortriptyline (PAMELOR) 10  MG capsule Take 1 capsule (10 mg total) by mouth at bedtime.  30 capsule  2  . ondansetron (ZOFRAN) 4 MG tablet Take 4 mg by mouth every 8 (eight) hours as needed.        . zolpidem (AMBIEN CR) 12.5 MG CR tablet Take 5 mg by mouth at bedtime as needed.         OBJECTIVE: Filed Vitals:   08/31/11 0911  BP: 154/89  Pulse: 105  Temp: 98 F (36.7 C)     Body mass index is 45.45 kg/(m^2).    ECOG FS: 1  Physical Exam: HEENT:  Sclerae anicteric, conjunctivae pink.  Oropharynx clear.   Nodes:  No cervical, supraclavicular, or axillary lymphadenopathy palpated.  Breast Exam:  Deferred  Lungs:  Clear to auscultation bilaterally.  No crackles, rhonchi, or wheezes.   Heart:  Regular rate and rhythm.   Abdomen:  Soft, obese, nontender.  Positive bowel sounds.  No organomegaly or masses palpated.   Musculoskeletal: Mild tenderness to palpation diffusely along the cervical and thoracic spine.    Extremities:  Lymphedema noted in the right upper extremity with compression sleeve and glove in place. No additional peripheral edema noted. No peripheral cyanosis. Skin:  Benign.   Neuro:  Nonfocal.    LAB RESULTS: Lab Results  Component Value Date   WBC 8.3 08/31/2011   NEUTROABS 5.3 08/31/2011   HGB 12.6 08/31/2011   HCT 37.9 08/31/2011   MCV 88.3 08/31/2011   PLT 264 08/31/2011      Chemistry      Component Value Date/Time   NA 140 08/31/2011 0855   K 3.0* 08/31/2011 0855   CL 102 08/31/2011 0855   CO2 29 08/31/2011 0855   BUN 14 08/31/2011 0855   CREATININE 0.59 08/31/2011 0855      Component Value Date/Time   CALCIUM 9.6 08/31/2011 0855   ALKPHOS 69 08/31/2011 0855   AST 16 08/31/2011 0855   ALT 15 08/31/2011 0855   BILITOT 0.1* 08/31/2011 0855       Lab Results  Component Value Date   LABCA2 24 08/31/2011     STUDIES: Patient is scheduled for her next screening left mammogram on 09/20/2011.  ASSESSMENT: 36 year old BRCA1 and BRCA2 negative Karen Hopkins woman, status post right breast biopsy in March 2011 for a high-grade invasive ductal carcinoma, ER positive 3%, PR positive 14%, strongly HER2/neu positive with MIB-1 of 15%. Biopsy proven lymph node involvement at presentation. Changes consistent with inflammatory carcinoma.  1. Treated neoadjuvantly, status post 4 dose-dense cycles of doxorubicin/cyclophosphamide, followed by 12 weekly doses of paclitaxel with trastuzumab.  2. Trastuzumab was then continued for a total of 1 year. Following 1 year, an echo showed well-preserved ejection fraction.  3. Status post definitive right modified radical mastectomy in October 2011 showing a residual 2.5 mm area of tumor, grade 3, 1 of 21 lymph nodes involved. Again, ER positive, PR positive.  4. Received postmastectomy radiation, completed in January 2012, at which time she began on tamoxifen.  5. Now with chronic postsurgical pain in the right chest wall, right axillary region, and  right shoulder. Also with chronic lymphedema in the right upper extremity.   PLAN: With the exception of the postsurgical pain, Zuzu is doing quite well with regards to her breast cancer. I have strongly encouraged her to participate in the upcoming [facility] group which begins next week. I am really hopeful that she will do so, and I think this will give her a lot  of support and encouragement.  Otherwise, she will continue on her tamoxifen which she is tolerating well. She will continue to be followed at the pain clinic as well as lymphedema clinic.  She will have her left screening mammogram in April. I will see her again in followup in 3 months. She knows to call prior to that time with any changes or problems.  Eleanore Junio    08/31/2011

## 2011-08-31 NOTE — Telephone Encounter (Signed)
gave patient appointment for 11-24-2011 starting at 9:15am printed out calendar and gave to the patient

## 2011-09-01 ENCOUNTER — Encounter: Payer: Medicaid Other | Admitting: Physical Therapy

## 2011-09-06 ENCOUNTER — Ambulatory Visit: Payer: Medicaid Other | Admitting: Physical Medicine & Rehabilitation

## 2011-09-06 ENCOUNTER — Other Ambulatory Visit: Payer: Self-pay | Admitting: *Deleted

## 2011-09-06 DIAGNOSIS — C50419 Malignant neoplasm of upper-outer quadrant of unspecified female breast: Secondary | ICD-10-CM

## 2011-09-06 MED ORDER — ALPRAZOLAM 0.5 MG PO TABS: ORAL_TABLET | ORAL | Status: DC

## 2011-09-07 ENCOUNTER — Encounter: Payer: Self-pay | Admitting: Physical Medicine & Rehabilitation

## 2011-09-13 ENCOUNTER — Encounter: Payer: Medicaid Other | Attending: Physical Medicine & Rehabilitation

## 2011-09-13 ENCOUNTER — Ambulatory Visit (HOSPITAL_BASED_OUTPATIENT_CLINIC_OR_DEPARTMENT_OTHER): Payer: Medicaid Other | Admitting: Physical Medicine & Rehabilitation

## 2011-09-13 ENCOUNTER — Encounter: Payer: Self-pay | Admitting: Physical Medicine & Rehabilitation

## 2011-09-13 VITALS — BP 132/78 | HR 110 | Resp 16 | Ht 64.0 in | Wt 268.0 lb

## 2011-09-13 DIAGNOSIS — Z9010 Acquired absence of unspecified breast and nipple: Secondary | ICD-10-CM | POA: Insufficient documentation

## 2011-09-13 DIAGNOSIS — G893 Neoplasm related pain (acute) (chronic): Secondary | ICD-10-CM | POA: Insufficient documentation

## 2011-09-13 DIAGNOSIS — F4322 Adjustment disorder with anxiety: Secondary | ICD-10-CM | POA: Insufficient documentation

## 2011-09-13 DIAGNOSIS — Z923 Personal history of irradiation: Secondary | ICD-10-CM | POA: Insufficient documentation

## 2011-09-13 DIAGNOSIS — M25579 Pain in unspecified ankle and joints of unspecified foot: Secondary | ICD-10-CM | POA: Insufficient documentation

## 2011-09-13 DIAGNOSIS — IMO0001 Reserved for inherently not codable concepts without codable children: Secondary | ICD-10-CM | POA: Insufficient documentation

## 2011-09-13 DIAGNOSIS — M722 Plantar fascial fibromatosis: Secondary | ICD-10-CM | POA: Insufficient documentation

## 2011-09-13 DIAGNOSIS — M79609 Pain in unspecified limb: Secondary | ICD-10-CM | POA: Insufficient documentation

## 2011-09-13 DIAGNOSIS — Z9221 Personal history of antineoplastic chemotherapy: Secondary | ICD-10-CM | POA: Insufficient documentation

## 2011-09-13 DIAGNOSIS — F4321 Adjustment disorder with depressed mood: Secondary | ICD-10-CM | POA: Insufficient documentation

## 2011-09-13 DIAGNOSIS — R209 Unspecified disturbances of skin sensation: Secondary | ICD-10-CM | POA: Insufficient documentation

## 2011-09-13 DIAGNOSIS — R071 Chest pain on breathing: Secondary | ICD-10-CM | POA: Insufficient documentation

## 2011-09-13 DIAGNOSIS — C50919 Malignant neoplasm of unspecified site of unspecified female breast: Secondary | ICD-10-CM | POA: Insufficient documentation

## 2011-09-13 DIAGNOSIS — M25569 Pain in unspecified knee: Secondary | ICD-10-CM | POA: Insufficient documentation

## 2011-09-13 DIAGNOSIS — M549 Dorsalgia, unspecified: Secondary | ICD-10-CM | POA: Insufficient documentation

## 2011-09-13 DIAGNOSIS — M2240 Chondromalacia patellae, unspecified knee: Secondary | ICD-10-CM | POA: Insufficient documentation

## 2011-09-13 DIAGNOSIS — R29898 Other symptoms and signs involving the musculoskeletal system: Secondary | ICD-10-CM | POA: Insufficient documentation

## 2011-09-13 NOTE — Patient Instructions (Signed)
Myofascial Pain Syndrome  Myofascial pain syndrome is a pain disorder. This pain may be felt in the muscles. It may come and go. Myofascial pain syndrome always has trigger or tender points in the muscle that will cause pain when pressed.    CAUSES  Myofascial pain may be caused by injuries, especially auto accidents, or by overuse of certain muscles. Typically the pain is long lasting. It is made worse by overuse of the involved muscles, emotional distress, and by cold, damp weather. Myofascial pain syndrome often develops in patients whose response to stress is an increase in muscle tone, and is seen in greater frequency in patients with pre-existing tension headaches.  SYMPTOMS    Myofascial pain syndrome causes a wide variety of symptoms. You may see tight ropy bands of muscle. Problems may also include aching, cramping, burning, numbness, tingling, and other uncomfortable sensations in muscular areas. It most commonly affects the neck, upper back, and shoulder areas. Pain often radiates into the arms and hands.    TREATMENT   Treatment includes resting the affected muscular area and applying ice packs to reduce spasm and pain. Trigger point injection, is a valuable initial therapy. This therapy is an injection of local anesthetic directly into the trigger point. Trigger points are often present at the source of pain. Pain relief following injection confirms the diagnosis of myofascial pain syndrome. Fairly vigorous therapy can be carried out during the pain-free period after each injection. Stretching exercises to loosen up the muscles are also useful. Transcutaneous electrical nerve stimulation (TENS) may provide relief from pain. TENS is the use of electric current produced by a device to stimulate the nerves. Ultrasound therapy applied directly over the affected muscle may also provide pain relief. Anti-inflammatory pain medicine can be helpful. Symptoms will gradually improve over a period of weeks to months with proper treatment.  HOME CARE INSTRUCTIONS  Call your caregiver for follow-up care as recommended.    SEEK MEDICAL CARE IF:    Your pain is severe and not helped with medications.  Document Released: 07/01/2004 Document Revised: 05/13/2011 Document Reviewed: 07/10/2010  [employer] Patient Information 2012 [employer].

## 2011-09-13 NOTE — Progress Notes (Signed)
  Subjective:    Patient ID: Karen Hopkins, female    DOB: July 23, 1975, 36 y.o.   MRN: [id number]  HPI Patient go through physical therapy he states it's helping somewhat. ALternaGEL has been very helpful for the knees. Lidocaine gel has not been helpful for her right pectoral area. We discussed trying the Voltaren gel to that area as well. Still has pain primarily in the trapezius. area as well as the scapular area. Not much pain in the shoulder area itself although some pain with shoulder range of motion. Pain Inventory Average Pain 7 Pain Right Now 7 My pain is sharp, burning, dull, stabbing and aching  In the last 24 hours, has pain interfered with the following? General activity 7 Relation with others 7 Enjoyment of life 8 What TIME of day is your pain at its worst? daytime Sleep (in general) Poor  Pain is worse with: walking, bending, sitting, inactivity and standing Pain improves with: medication Relief from Meds: 1  Mobility walk without assistance how many minutes can you walk? 15 ability to climb steps?  yes do you drive?  yes  Function disabled: date disabled 2012  Neuro/Psych weakness numbness tingling trouble walking depression anxiety  Prior Studies Any changes since last visit?  no  Physicians involved in your care Any changes since last visit?  no   Review of Systems  HENT: Negative.   Eyes: Negative.   Respiratory: Negative.   Cardiovascular: Negative.   Gastrointestinal: Negative.   Genitourinary: Negative.   Musculoskeletal: Negative.   Skin: Negative.   Neurological: Positive for weakness and numbness.  Hematological: Negative.   Psychiatric/Behavioral: Negative.        Objective:   Physical Exam  Constitutional: She is oriented to [place], place, and time. She appears well-developed.  Musculoskeletal:       Right shoulder: She exhibits decreased range of motion, tenderness and pain. She exhibits no swelling and no effusion.   Right elbow: She exhibits swelling.       Right wrist: She exhibits swelling.       Arms: Neurological: She is alert and oriented to [place], place, and time.  Psychiatric: She has a normal mood and affect. Her behavior is normal. Judgment and thought content normal.   Patient has tenderness over the right upper trapezius as well as right infraspinatus as well as right levator scapula       Assessment & Plan:  1. Myofascial pain syndrome related to radiation and postoperative lymphedema for the treatment of her breast carcinoma. Please see muscles listed above. Will repeat trigger point injection. Have advised Voltaren gel to the right pectoralis. She may have some element of frozen shoulder. Will check ultrasound of the right shoulder next month.  PROCEDURE: Trigger point injection.  INDICATION: Muscular pain, only partially relieved by physical therapy  and medication management, interfering with activities.  Informed consent was obtained after describing risks and benefits of the  procedure with the patient. These include bleeding, bruising, and  infection. She elects to proceed. One area of the upper trap was  marked. One area of the levator scapula, superior medial to the  scapular border and one area in the infraspinatus fossa were marked,  prepped with Betadine and alcohol, entered with 25-gauge inch and half  needle, 1 mL of 1% lidocaine injected at each site. The patient  tolerated procedure well. Postprocedure instructions given

## 2011-09-14 ENCOUNTER — Encounter: Payer: Self-pay | Admitting: Physical Medicine & Rehabilitation

## 2011-09-20 ENCOUNTER — Ambulatory Visit: Payer: Medicaid Other

## 2011-10-01 ENCOUNTER — Encounter: Payer: Self-pay | Admitting: *Deleted

## 2011-10-01 NOTE — Progress Notes (Unsigned)
rec'd call from [place] at [employer] inquiring about approval for medicaid. [phone]

## 2011-10-05 ENCOUNTER — Other Ambulatory Visit: Payer: Self-pay | Admitting: *Deleted

## 2011-10-05 ENCOUNTER — Telehealth: Payer: Self-pay | Admitting: Physical Medicine & Rehabilitation

## 2011-10-05 DIAGNOSIS — C50419 Malignant neoplasm of upper-outer quadrant of unspecified female breast: Secondary | ICD-10-CM

## 2011-10-05 DIAGNOSIS — C50919 Malignant neoplasm of unspecified site of unspecified female breast: Secondary | ICD-10-CM

## 2011-10-05 MED ORDER — CITALOPRAM HYDROBROMIDE 20 MG PO TABS: 20.0000 mg | ORAL_TABLET | Freq: Every day | ORAL | Status: DC

## 2011-10-05 MED ORDER — NAPROXEN 500 MG PO TABS: ORAL_TABLET | ORAL | Status: DC

## 2011-10-05 MED ORDER — ALPRAZOLAM 0.5 MG PO TABS: ORAL_TABLET | ORAL | Status: DC

## 2011-10-05 MED ORDER — TRIAMTERENE-HCTZ 37.5-25 MG PO TABS: 1.0000 | ORAL_TABLET | Freq: Every day | ORAL | Status: DC

## 2011-10-05 NOTE — Telephone Encounter (Signed)
May try increasing tramadol to 2 tabs TID

## 2011-10-05 NOTE — Telephone Encounter (Signed)
In pain every day/night.  Constant.  Mostly in chest, back and arm.  Can you give her something?  Also, would like to go back to PT for arm.

## 2011-10-06 ENCOUNTER — Ambulatory Visit
Admission: RE | Admit: 2011-10-06 | Discharge: 2011-10-06 | Disposition: A | Discharge status: Home / Self Care | Payer: Medicaid Other | Source: Ambulatory Visit | Attending: Physician Assistant | Admitting: Physician Assistant

## 2011-10-06 DIAGNOSIS — C50919 Malignant neoplasm of unspecified site of unspecified female breast: Secondary | ICD-10-CM

## 2011-10-07 NOTE — Telephone Encounter (Signed)
No need to restart therapy I will see her in clinc and advise .  Please get latest therapy notes for my review

## 2011-10-08 NOTE — Telephone Encounter (Signed)
Left message on personal email stating it belongs to  Karen Hopkins, telling her that she does not need to restart therapy and he would discuss at her 10/18/11 appointment. I requested that she return call to let us know where her therapy was done.

## 2011-10-14 ENCOUNTER — Other Ambulatory Visit: Payer: Self-pay | Admitting: *Deleted

## 2011-10-14 DIAGNOSIS — C50919 Malignant neoplasm of unspecified site of unspecified female breast: Secondary | ICD-10-CM

## 2011-10-14 MED ORDER — TAMOXIFEN CITRATE 20 MG PO TABS: 20.0000 mg | ORAL_TABLET | Freq: Every day | ORAL | Status: DC

## 2011-10-18 ENCOUNTER — Ambulatory Visit: Payer: Medicaid Other | Admitting: Physical Medicine & Rehabilitation

## 2011-10-20 ENCOUNTER — Telehealth: Payer: Self-pay | Admitting: *Deleted

## 2011-10-20 NOTE — Telephone Encounter (Signed)
She apologizes but she totally forgot her Monday appointment and has rescheduled but can't see Dr Wynn Banker until 11/09/11. Tramadol is just not working and she has had to stay in the bed a lot due to her body hurting. Can we do something else; can she have something different than Tramadol?

## 2011-10-21 NOTE — Telephone Encounter (Signed)
Patient to make an appointment to be seen by Clydie Braun next week to reassess

## 2011-10-22 NOTE — Telephone Encounter (Signed)
Left message on personally identified VM of Ms Persons that Dr Wynn Banker wants her to be seen next week by our PA for assessment of her pain issues, and to call our office and schedule an appointment.

## 2011-10-29 ENCOUNTER — Encounter: Payer: Medicaid Other | Attending: Physical Medicine & Rehabilitation | Admitting: Physical Medicine and Rehabilitation

## 2011-10-29 ENCOUNTER — Encounter: Payer: Self-pay | Admitting: Physical Medicine and Rehabilitation

## 2011-10-29 VITALS — BP 132/85 | HR 86 | Resp 18 | Ht 64.0 in | Wt 270.0 lb

## 2011-10-29 DIAGNOSIS — R0789 Other chest pain: Secondary | ICD-10-CM | POA: Insufficient documentation

## 2011-10-29 DIAGNOSIS — M62838 Other muscle spasm: Secondary | ICD-10-CM | POA: Insufficient documentation

## 2011-10-29 DIAGNOSIS — Z9010 Acquired absence of unspecified breast and nipple: Secondary | ICD-10-CM | POA: Insufficient documentation

## 2011-10-29 DIAGNOSIS — R071 Chest pain on breathing: Secondary | ICD-10-CM

## 2011-10-29 DIAGNOSIS — M25519 Pain in unspecified shoulder: Secondary | ICD-10-CM | POA: Insufficient documentation

## 2011-10-29 MED ORDER — METHOCARBAMOL 500 MG PO TABS: 500.0000 mg | ORAL_TABLET | Freq: Three times a day (TID) | ORAL | Status: AC

## 2011-10-29 NOTE — Patient Instructions (Signed)
Advised patient to talk to her oncologist who prescribes her celexa, to maybe change her medication to cymbalta which also has antalgic properties. Continue exercises with right shoulder. See PT

## 2011-10-29 NOTE — Progress Notes (Signed)
Subjective:    Patient ID: Karen Hopkins, female    DOB: Oct 19, 1975, 36 y.o.   MRN: [id number] The patient complains about right shoulder pain and right sided chest pain. She has a history of a total mastectomy on the right in September 2011 and subsequently chemotherapy and radiation treatment. Her symptoms started shortly after she received those treatments. The patient is to be in pain and reports that she does not get sufficient relief from her current medications. She states that moving her right arm aggravates the pain and holding her arms to alleviates the symptoms. She reports, that she got relief from her knee pain with the voltaren gel to knees.  Pain Inventory Average Pain 7 Pain Right Now 7 My pain is constant, sharp, burning, dull and stabbing  In the last 24 hours, has pain interfered with the following? General activity 9 Relation with others 9 Enjoyment of life 9 What TIME of day is your pain at its worst? in the morning, during day, and at night Sleep (in general) Poor  Pain is worse with: walking, bending, sitting, inactivity and standing Pain improves with: medication Relief from Meds: 1  Mobility walk without assistance how many minutes can you walk? 15 ability to climb steps?  yes do you drive?  yes  Function Not employed. Children help w/ housework.  Neuro/Psych weakness numbness tingling spasms depression anxiety  Prior Studies Any changes since last visit?  no  Physicians involved in your care Any changes since last visit?  no   Family History  Problem Relation Age of Onset  . Hypertension Mother   . Diabetes type II Father   . Prostate cancer Father    History   Social History  . Marital Status: Single    Spouse Name: N/A    Number of Children: N/A  . Years of Education: N/A   Social History Main Topics  . Smoking status: Former Smoker    Types: Cigarettes    Quit date: 04/26/2010  . Smokeless tobacco: None  . Alcohol Use: No    . Drug Use: None  . Sexually Active: None   Other Topics Concern  . None   Social History Narrative  . None   Past Surgical History  Procedure Date  . Mastectomy right sided complete  . Hernia repair 2006  . Nasal septum surgery    Past Medical History  Diagnosis Date  . Breast cancer stage 3    radiation/chemo/ right mastectomy  . Seasonal allergies   . Hypertension    BP 132/85  Pulse 86  Resp 18  Ht 5\' 4"  (1.626 m)  Wt 270 lb (122.471 kg)  BMI 46.35 kg/m2  SpO2 99%     HPI    Review of Systems  Constitutional: Positive for unexpected weight change (wt gain r/t oral chemo and inactivity).  HENT: Negative.   Eyes: Negative.   Respiratory: Negative.   Cardiovascular: Negative.   Gastrointestinal: Negative.   Genitourinary: Negative.   Musculoskeletal: Positive for myalgias.  Skin: Negative.        lymphedema  Neurological: Negative.   Hematological: Negative.   Psychiatric/Behavioral: Positive for dysphoric mood.       Objective:   Physical Exam  Constitutional: She is oriented to [name], place, and time. She appears well-developed.  HENT:  Head: Normocephalic.  Eyes: Pupils are equal, round, and reactive to light.  Neck: Normal range of motion.  Neurological: She is alert and oriented to [name], place, and time.  Skin: Skin is warm and dry.  Psychiatric: She has a normal mood and affect.   Contracture of pectoralis major and minor, scar tissue, external rotation with abduction of right shoulder severely restricted, Abduction 80 degrees, external rotation in abduction 10 degrees. Otherwise strength grossly normal in 4 extremities.       Assessment & Plan:  36 year old female, status post total mastectomy on the right in September 2011, and subsequentially chemotherapy and radiation therapy, after which she developed right shoulder and right chest pain with contractures of the pectoralis major and minor muscle. The patient should continue with  her current medication and today I added Robaxin as a muscle relaxant. I also advised her to talk to her oncologist, who prescribes Celexa  for depression, to maybe change this to Cymbalta, because this medication also has an antalgic effect. I also ordered physical therapy with modalities for stretching and loosening the scar tissue in her right chest and shoulder. Patient should follow up in one month.

## 2011-11-09 ENCOUNTER — Ambulatory Visit: Payer: Medicaid Other | Admitting: Physical Medicine & Rehabilitation

## 2011-11-22 ENCOUNTER — Encounter: Payer: Medicaid Other | Attending: Physical Medicine & Rehabilitation | Admitting: Physical Medicine and Rehabilitation

## 2011-11-22 ENCOUNTER — Encounter: Payer: Self-pay | Admitting: Physical Medicine and Rehabilitation

## 2011-11-22 VITALS — BP 141/86 | HR 104 | Resp 18 | Ht 64.0 in | Wt 274.2 lb

## 2011-11-22 DIAGNOSIS — R0789 Other chest pain: Secondary | ICD-10-CM | POA: Insufficient documentation

## 2011-11-22 DIAGNOSIS — R071 Chest pain on breathing: Secondary | ICD-10-CM

## 2011-11-22 DIAGNOSIS — M25519 Pain in unspecified shoulder: Secondary | ICD-10-CM | POA: Insufficient documentation

## 2011-11-22 NOTE — Patient Instructions (Signed)
Start PT, talk to your oncologist to change the Celexa to Cymbalta, because of pain relief properties.

## 2011-11-22 NOTE — Progress Notes (Signed)
Subjective:    Patient ID: Karen Hopkins, female    DOB: 1976/03/17, 36 y.o.   MRN: [id number]  HPI The patient complains about right shoulder pain and right sided chest pain. She has a history of a total mastectomy on the right in September 2011 and subsequently chemotherapy and radiation treatment. Her symptoms started shortly after she received those treatments. The patient is to be in pain and reports that she does not get sufficient relief from her current medications. She states that moving her right arm aggravates the pain and holding her arms to alleviates the symptoms. Patient reports that the prescribed medication, Mobic and Robaxin made her feel sick, therefore she discontinued these medications. She has not started PT yet, because they never called her back for appointments.Has not talked to her oncologist to change celexa to cymbalta, because of the pain relief property of cymbalta. She states that she has done some exercises and stretches for her right shoulder on her own. She reports, that she got relief from her knee pain with the voltaren gel to knees.    Pain Inventory Average Pain 6 Pain Right Now 5 My pain is intermittent, sharp, dull, tingling and aching  In the last 24 hours, has pain interfered with the following? General activity 5 Relation with others 5 Enjoyment of life 7 What TIME of day is your pain at its worst? night Sleep (in general) Poor  Pain is worse with: walking, bending, sitting, standing and some activites Pain improves with: therapy/exercise and medication Relief from Meds: 2  Mobility walk without assistance how many minutes can you walk? 15 ability to climb steps?  yes do you drive?  yes  Function Do you have any goals in this area?  no  Neuro/Psych bladder control problems numbness tingling trouble walking spasms confusion depression anxiety  Prior Studies Any changes since last visit?  no  Physicians involved in your  care Any changes since last visit?  no   Family History  Problem Relation Age of Onset  . Hypertension Mother   . Diabetes type II Father   . Prostate cancer Father    History   Social History  . Marital Status: Single    Spouse Name: N/A    Number of Children: N/A  . Years of Education: N/A   Social History Main Topics  . Smoking status: Former Smoker    Types: Cigarettes    Quit date: 04/26/2010  . Smokeless tobacco: Never Used  . Alcohol Use: No  . Drug Use: None  . Sexually Active: None   Other Topics Concern  . None   Social History Narrative  . None   Past Surgical History  Procedure Date  . Mastectomy right sided complete  . Hernia repair 2006  . Nasal septum surgery    Past Medical History  Diagnosis Date  . Breast cancer stage 3    radiation/chemo/ right mastectomy  . Seasonal allergies   . Hypertension    BP 141/86  Pulse 104  Resp 18  Ht 5\' 4"  (1.626 m)  Wt 274 lb 3.2 oz (124.376 kg)  BMI 47.07 kg/m2  SpO2 97%    Review of Systems  Constitutional: Positive for diaphoresis and unexpected weight change.  Genitourinary: Positive for urgency.  Musculoskeletal: Positive for gait problem.       Spasms  Neurological: Positive for numbness.  Psychiatric/Behavioral: Positive for confusion and dysphoric mood. The patient is nervous/anxious.        Objective:  Physical Exam Constitutional: She is oriented to Karen Hopkins, place, and time. She appears well-developed.  HENT:  Head: Normocephalic.  Eyes: Pupils are equal, round, and reactive to light.  Neck: Normal range of motion.  Neurological: She is alert and oriented to Karen Hopkins, place, and time.  Skin: Skin is warm and dry.  Psychiatric: She has a normal mood and affect.   Contracture of pectoralis major and minor, scar tissue, external rotation with abduction of right shoulder severely restricted, Abduction is about 90 degrees, external rotation in abduction 30 degrees.  Otherwise strength  grossly normal in 4 extremities.        Assessment & Plan:  36 year old female, status post total mastectomy on the right in September 2011, and subsequentially chemotherapy and radiation therapy, after which she developed right shoulder and right chest pain with contractures of the pectoralis major and minor muscle.  The patient should continue with her current medication. I also advised her to talk to her oncologist, who prescribes Celexa for depression, to maybe change this to Cymbalta, because this medication also has an antalgic effect. I also ordered physical therapy with modalities for stretching and loosening the scar tissue in her right chest and shoulder. Patient has not done either of those, encouraged patient to do these until the next visit. Patient should follow up in one month.

## 2011-11-24 ENCOUNTER — Ambulatory Visit: Payer: Medicaid Other

## 2011-11-24 ENCOUNTER — Encounter: Payer: Self-pay | Admitting: Physician Assistant

## 2011-11-24 ENCOUNTER — Ambulatory Visit (HOSPITAL_BASED_OUTPATIENT_CLINIC_OR_DEPARTMENT_OTHER): Payer: Medicaid Other | Admitting: Physician Assistant

## 2011-11-24 ENCOUNTER — Other Ambulatory Visit (HOSPITAL_BASED_OUTPATIENT_CLINIC_OR_DEPARTMENT_OTHER): Payer: Medicaid Other | Admitting: Lab

## 2011-11-24 ENCOUNTER — Telehealth: Payer: Self-pay | Admitting: *Deleted

## 2011-11-24 ENCOUNTER — Telehealth: Payer: Self-pay | Admitting: Oncology

## 2011-11-24 VITALS — BP 157/81 | HR 86 | Temp 97.2°F | Ht 64.0 in | Wt 273.5 lb

## 2011-11-24 DIAGNOSIS — F411 Generalized anxiety disorder: Secondary | ICD-10-CM

## 2011-11-24 DIAGNOSIS — C50919 Malignant neoplasm of unspecified site of unspecified female breast: Secondary | ICD-10-CM

## 2011-11-24 DIAGNOSIS — F419 Anxiety disorder, unspecified: Secondary | ICD-10-CM

## 2011-11-24 DIAGNOSIS — N390 Urinary tract infection, site not specified: Secondary | ICD-10-CM

## 2011-11-24 LAB — COMPREHENSIVE METABOLIC PANEL
AST: 13 U/L (ref 0–37)
Alkaline Phosphatase: 65 U/L (ref 39–117)
Glucose, Bld: 146 mg/dL — ABNORMAL HIGH (ref 70–99)
Sodium: 138 mEq/L (ref 135–145)
Total Bilirubin: 0.1 mg/dL — ABNORMAL LOW (ref 0.3–1.2)
Total Protein: 7 g/dL (ref 6.0–8.3)

## 2011-11-24 LAB — CBC WITH DIFFERENTIAL/PLATELET
EOS%: 5 % (ref 0.0–7.0)
Eosinophils Absolute: 0.4 10*3/uL (ref 0.0–0.5)
LYMPH%: 29.7 % (ref 14.0–49.7)
MCH: 29.9 pg (ref 25.1–34.0)
MCHC: 33.7 g/dL (ref 31.5–36.0)
MCV: 88.6 fL (ref 79.5–101.0)
MONO%: 6.2 % (ref 0.0–14.0)
NEUT#: 4.3 10*3/uL (ref 1.5–6.5)
Platelets: 252 10*3/uL (ref 145–400)
RBC: 4.23 10*6/uL (ref 3.70–5.45)

## 2011-11-24 LAB — URINALYSIS, MICROSCOPIC - CHCC
Bilirubin (Urine): NEGATIVE
Blood: NEGATIVE
Glucose: NEGATIVE g/dL
Ketones: NEGATIVE mg/dL
Leukocyte Esterase: NEGATIVE
pH: 7 (ref 4.6–8.0)

## 2011-11-24 MED ORDER — ALPRAZOLAM ER 1 MG PO TB24: 1.0000 mg | ORAL_TABLET | ORAL | Status: DC

## 2011-11-24 MED ORDER — TAMOXIFEN CITRATE 20 MG PO TABS: 20.0000 mg | ORAL_TABLET | Freq: Every day | ORAL | Status: DC

## 2011-11-24 NOTE — Telephone Encounter (Signed)
gve the pt her sept,dec 2013 appt calendar aLONG WITH THE appt to see dr Ashley Royalty at [facility] in july

## 2011-11-24 NOTE — Progress Notes (Signed)
ID: Karen Hopkins   DOB: 05/16/1976  MR#: [id number]  WUJ#:[id number]  HISTORY OF PRESENT ILLNESS: The patient noted swelling and redness, as well as some tenderness, over her right breast for some time. She thought this might be related to pregnancy, since she delivered her third child about 8 months ago. As the problem did not improve, she brought it to her primary care physician's attention. He treated her with Bactrim, which she says she could not tolerate because of nausea and vomiting, but which in any case did not resolve the problem, so he set her up for mammography at [facility] 08/26/2009. Dr. Deboraha Sprang found by exam marked skin thickening and erythema of the right breast extending pretty much throughout the breast. By mammography there were scattered fibroglandular densities, and an asymmetric density in the right upper outer quadrant with suspicious microcalcifications. There were also enlarged right axillary lymph nodes. The left appeared normal. By ultrasound there was an ill defined area of hypoechoic tissue in the right upper outer quadrant that was difficult to measure. There were also abnormal right axillary lymph nodes.  Ultrasound guided biopsy was performed the same day, and showed [phone]) a poorly differentiated invasive ductal carcinoma, which was estrogen receptor poor at 3%, progesterone receptor positive at 14% with an elevated proliferation marker at 50%, but importantly with strong amplification of HER-2 by CISH with a ratio of 5.20.  With this information, the patient was referred to Dr. Jamey Ripa, and bilateral breast MRIs were obtained 09/02/2009. This showed an area up to 10.6 cm in the right breast showing confluent mass and non-mass enhancement. There was marked skin thickening involving the entire right breast, and numerous bulky right axillary lymph nodes were identified. The left breast was unremarkable, and there was no evidence of internal mammary lymph node  involvement.  Patient was treated in the neoadjuvant setting with 4 dose dense cycles of doxorubicin and cyclophosphamide, followed by 12 weekly doses of paclitaxel and trastuzumab. Trastuzumab was continued for a total of one year. Following one year, an echocardiogram did in fact show well preserved ejection fraction.  Patient status post definitive right modified radical mastectomy in October 2011, with a residual 2.5 mm area of tumor, grade 3, with one of 21 lymph nodes involved. Tumor was again ER and PR positive.  Received postmastectomy radiation, completed in January 2012, at which time she began on tamoxifen at 20 mg daily.  Patient is known to be BRCA1 and BRCA2 negative.  Patient has a history of chronic postsurgical pain in the right chest wall, right axillary region, and right shoulder, in addition to chronic lymphedema in the right upper extremity.  INTERVAL HISTORY: Karen Hopkins returns today for followup of her right breast carcinoma. She continues on tamoxifen which she is tolerating well.  Karen Hopkins continues to be followed by the pain clinic. Unfortunately, the pain is still disease biggest complaint. They have tried tramadol, Robaxin, and Mobic, with very little relief. They're scheduling her for additional physical therapy. They have suggested the possibility of switching from Celexa to Cymbalta, but unfortunately the Cymbalta is contraindicated with tamoxifen, so this would not be a feasible change.   Karen Hopkins continues to struggle with some depression and anxiety although she denies any suicidal ideations. She finds it difficult to exercise due to her pain, and spends a lot of time lying down she tells me. Of course she does have her 3 little boys she is taking care of, her youngest son having just turned 3  years old.  REVIEW OF SYSTEMS: Karen Hopkins has occasional hot flashes. She has some vaginal discharge, previously clear and nonodorous, although it has become darker more recently. Her last  menstrual cycle was in January 2013. She does have some mild dysuria and noted one spot of blood on the toilet tissue after urinating yesterday. No additional signs of abnormal bleeding and no frank hematuria.  Karen Hopkins denies any recent illnesses and has had no fevers or chills. No rashes or skin changes. No nausea or change in bowel habits. No chest pain or cough. She does have shortness of breath with exertion, and continues to be very fatigued. No abnormal headaches, dizziness, or change in vision. Her lymphedema is stable to slightly improved, and she's had no additional signs of swelling elsewhere. No evidence of abnormal clotting.  A detailed review of systems is otherwise noncontributory.  PAST MEDICAL HISTORY: Past Medical History  Diagnosis Date  . Breast cancer stage 3    radiation/chemo/ right mastectomy  . Seasonal allergies   . Hypertension     PAST SURGICAL HISTORY: Past Surgical History  Procedure Date  . Mastectomy right sided complete  . Hernia repair 2006  . Nasal septum surgery     FAMILY HISTORY Family History  Problem Relation Age of Onset  . Hypertension Mother   . Diabetes type II Father   . Prostate cancer Father   There is no history of breast or ovarian cancer in the immediate family, but of the patient's mother's mother's six sisters, two (the patient's great-aunts) had ovarian cancer.  GYNECOLOGIC HISTORY: The patient is GX, P3. First child was premature. Age at first delivery, 35 years old.   SOCIAL HISTORY: She is currently disabled. Karen Hopkins's children are Karen Hopkins, and Karen Hopkins. They are all boys, all at home. The patient attends a non-denominational church in [place], which she considers her home, but she is currently living in [place].   ADVANCED DIRECTIVES:  HEALTH MAINTENANCE: History  Substance Use Topics  . Smoking status: Former Smoker    Types: Cigarettes    Quit date: 04/26/2010  . Smokeless tobacco: Never Used  . Alcohol  Use: No     Colonoscopy: Never  PAP: 2012  Bone density: Never  Lipid panel:  Allergies  Allergen Reactions  . Penicillins     REACTION: Facial swelling    Current Outpatient Prescriptions  Medication Sig Dispense Refill  . ALPRAZolam (XANAX XR) 1 MG 24 hr tablet Take 1 tablet (1 mg total) by mouth every morning.  30 tablet  0  . cetirizine (ZYRTEC) 10 MG tablet Take 10 mg by mouth daily.        . citalopram (CELEXA) 20 MG tablet Take 1 tablet (20 mg total) by mouth daily.  30 tablet  5  . desloratadine (CLARINEX) 5 MG tablet Take 5 mg by mouth daily.      . diclofenac sodium (VOLTAREN) 1 % GEL Apply 1 application topically 4 (four) times daily.      Marland Kitchen gabapentin (NEURONTIN) 300 MG capsule Take 1 capsule (300 mg total) by mouth 3 (three) times daily. Prescribed by oncologist  90 capsule  3  . lidocaine (LIDODERM) 5 % Place 1 patch onto the skin daily. Remove & Discard patch within 12 hours or as directed by MD       . meloxicam (MOBIC) 7.5 MG tablet Take 1 tablet (7.5 mg total) by mouth daily.  30 tablet  1  . naproxen (NAPROSYN) 500 MG tablet TAKE ONE  TAB THREE TIMES DAILY WITH MEALS.  90 tablet  5  . nortriptyline (PAMELOR) 10 MG capsule Take 1 capsule (10 mg total) by mouth at bedtime.  30 capsule  2  . ondansetron (ZOFRAN) 4 MG tablet Take 4 mg by mouth every 8 (eight) hours as needed.        . tamoxifen (NOLVADEX) 20 MG tablet Take 1 tablet (20 mg total) by mouth daily.  30 tablet  5  . traMADol (ULTRAM) 50 MG tablet Take 50 mg by mouth every 6 (six) hours as needed.      . triamterene-hydrochlorothiazide (MAXZIDE-25) 37.5-25 MG per tablet Take 1 each (1 tablet total) by mouth daily.  30 tablet  5  . zolpidem (AMBIEN CR) 12.5 MG CR tablet Take 5 mg by mouth at bedtime as needed.         OBJECTIVE: Young [place] woman who appears anxious and slightly uncomfortable, but in no acute distress. Filed Vitals:   11/24/11 0948  BP: 157/81  Pulse: 86  Temp: 97.2 F (36.2  C)     Body mass index is 46.95 kg/(m^2).    ECOG FS: 2  Filed Weights   11/24/11 0948  Weight: 273 lb 8 oz (124.059 kg)    Physical Exam: HEENT:  Sclerae anicteric, conjunctivae pink.  Oropharynx clear.   Nodes:  No cervical, supraclavicular, or axillary lymphadenopathy palpated.  Breast Exam:  Patient is status post right mastectomy. There is hyperpigmentation secondary to postmastectomy radiation, but no suspicious skin changes. The right chest wall is slightly tender to palpation. No suspicious nodules. No evidence of recurrent disease. Left breast is benign, no masses, skin changes, or nipple inversion. Lungs:  Clear to auscultation bilaterally.  No crackles, rhonchi, or wheezes.   Heart:  Regular rate and rhythm.   Abdomen:  Soft, obese, nontender.  Positive bowel sounds.  No organomegaly or masses palpated.   Musculoskeletal: Mild tenderness to palpation diffusely along the cervical and thoracic spine.  Extremities:  Lymphedema noted in the right upper extremity with compression sleeve and glove in place. No additional peripheral edema noted. No peripheral cyanosis. Skin:  Benign.   Neuro:  Nonfocal. Alert and oriented x3    LAB RESULTS: Lab Results  Component Value Date   WBC 7.3 11/24/2011   NEUTROABS 4.3 11/24/2011   HGB 12.6 11/24/2011   HCT 37.5 11/24/2011   MCV 88.6 11/24/2011   PLT 252 11/24/2011      Chemistry      Component Value Date/Time   NA 138 11/24/2011 0903   K 3.2* 11/24/2011 0903   CL 103 11/24/2011 0903   CO2 27 11/24/2011 0903   BUN 13 11/24/2011 0903   CREATININE 0.58 11/24/2011 0903      Component Value Date/Time   CALCIUM 9.1 11/24/2011 0903   ALKPHOS 65 11/24/2011 0903   AST 13 11/24/2011 0903   ALT 13 11/24/2011 0903   BILITOT 0.1* 11/24/2011 0903       Lab Results  Component Value Date   LABCA2 24 08/31/2011     STUDIES:  10/07/2011 DG SCREENING MAMMOGRAM LEFT  CC and MLO view(s) were taken of the left breast.  LEFT DIGITAL SCREENING  MAMMOGRAM WITH CAD:  There are scattered fibroglandular densities. No masses or malignant type calcifications are  identified. Compared with prior studies.  Images were processed with CAD.  IMPRESSION:  No specific mammographic evidence of malignancy. Next screening mammogram is recommended in one  year.  A result letter  of this screening mammogram will be mailed directly to the patient.  ASSESSMENT: Negative - BI-RADS 1  Screening mammogram in 1 year.   ASSESSMENT: 36 year old BRCA1 and BRCA2 negative Karen Hopkins woman, status post right breast biopsy in March 2011 for a high-grade invasive ductal carcinoma, ER positive 3%, PR positive 14%, strongly HER2/neu positive with MIB-1 of 15%. Biopsy proven lymph node involvement at presentation. Changes consistent with inflammatory carcinoma.  1. Treated neoadjuvantly, status post 4 dose-dense cycles of doxorubicin/cyclophosphamide, followed by 12 weekly doses of paclitaxel with trastuzumab.  2. Trastuzumab was then continued for a total of 1 year. Following 1 year, an echo showed well-preserved ejection fraction.  3. Status post definitive right modified radical mastectomy in October 2011 showing a residual 2.5 mm area of tumor, grade 3, 1 of 21 lymph nodes involved. Again, ER positive, PR positive.  4. Received postmastectomy radiation, completed in January 2012, at which time she began on tamoxifen.  5. Now with chronic postsurgical pain in the right chest wall, right axillary region, and right shoulder. Also with chronic lymphedema in the right upper extremity.   PLAN: With regards to her breast cancer, Karen Hopkins is doing well, and there is no clinical evidence of disease recurrence at this time. Due to her other issues, including her anxiety and repair surgical pain, we will continue seeing her for now on a every 3 month basis, and she will see me again in September. She'll continue to followup with the pain clinic as well. She anticipates beginning  physical therapy as they have recommended.  Lunden will continue on her tamoxifen which I have refilled today. We also changed her dosage of Xanax XR from 0.5 mg twice daily, to 1 mg once in the mornings. I have contacted Lenell Antu per the patient's request, to see if we might be able to coordinate some counseling for [place]. (Her contact number is [phone]) I  also encouraged her to join one of our support groups.   We will obtain a urinalysis and urine culture today to evaluate for possible urinary tract infection.  I am referring her back to Dr. Ashley Royalty at [facility] for a pelvic exam. (She tells me he had also wanted to repeat some blood work to evaluate her thyroid.)  Ashia voices understanding and agreement with this plan, and will call any changes or problems.   Zeshan Sena    11/24/2011

## 2011-11-24 NOTE — Telephone Encounter (Signed)
PER AB, HAVE NOTIFIED PT THAT SHE HAS NO UTI

## 2011-11-29 ENCOUNTER — Other Ambulatory Visit: Payer: Self-pay | Admitting: *Deleted

## 2011-11-29 MED ORDER — CIPROFLOXACIN HCL 500 MG PO TABS: 500.0000 mg | ORAL_TABLET | Freq: Two times a day (BID) | ORAL | Status: AC

## 2011-11-29 NOTE — Telephone Encounter (Signed)
Message left on identified VM for pt regarding prescription for ABX called in to pharmacy. Requested return call to verify communication.

## 2011-12-02 ENCOUNTER — Telehealth: Payer: Self-pay | Admitting: *Deleted

## 2011-12-02 NOTE — Telephone Encounter (Signed)
Has not been contacted about PT appointment. Was told to call if she didn't hear anything.

## 2011-12-08 ENCOUNTER — Other Ambulatory Visit (HOSPITAL_COMMUNITY)
Admission: RE | Admit: 2011-12-08 | Discharge: 2011-12-08 | Disposition: A | Discharge status: Home / Self Care | Payer: Medicaid Other | Source: Ambulatory Visit | Attending: Family Medicine | Admitting: Family Medicine

## 2011-12-08 ENCOUNTER — Ambulatory Visit (INDEPENDENT_AMBULATORY_CARE_PROVIDER_SITE_OTHER): Payer: Medicaid Other | Admitting: Family Medicine

## 2011-12-08 ENCOUNTER — Encounter: Payer: Self-pay | Admitting: Family Medicine

## 2011-12-08 VITALS — BP 138/84 | HR 92 | Temp 98.4°F | Ht 64.0 in | Wt 269.0 lb

## 2011-12-08 DIAGNOSIS — I890 Lymphedema, not elsewhere classified: Secondary | ICD-10-CM

## 2011-12-08 DIAGNOSIS — N898 Other specified noninflammatory disorders of vagina: Secondary | ICD-10-CM | POA: Insufficient documentation

## 2011-12-08 DIAGNOSIS — R5381 Other malaise: Secondary | ICD-10-CM

## 2011-12-08 DIAGNOSIS — R5383 Other fatigue: Secondary | ICD-10-CM

## 2011-12-08 DIAGNOSIS — Z113 Encounter for screening for infections with a predominantly sexual mode of transmission: Secondary | ICD-10-CM | POA: Insufficient documentation

## 2011-12-08 NOTE — Assessment & Plan Note (Addendum)
Check TSH, if this is normal we can consider checking A1c for evaluation of DM as her glucose has been elevated on recent cmet.

## 2011-12-08 NOTE — Patient Instructions (Addendum)
Thank you for coming in it was nice to see you I looked at the results of your testing when it returns. If you testing is negative I will treat empirically for yeast.

## 2011-12-12 NOTE — Progress Notes (Signed)
  Subjective:    Patient ID: Karen Hopkins, female    DOB: 12/03/1975, 36 y.o.   MRN: [id number]  HPI 1. Vaginal Discharge:  Vagina d/c x2 months.  Describes as creamy/white.  Also has foul odor.  Has has unprotected sex but only one partner.  Denies bleeding, abdominal pain, dysuria, urinary frequency  2. Excess drowsiness:  Continues with excess drowsiness, has been weaned from all her narcotics by her pain clinic.  Previous sleep study negative.  States she just feels sleepy all the time.  States that she typically sleeps well at night.  3. Pain:  As above has been weaned off narcotic pain medications.  Has sever pain from swelling (2/2 to lymphedema) in her R arm.  Currently on robaxin, meloxicam, and tramadol.  Is not taking robaxin or meloxicam due to this upsetting her stomach.  She is followed by pain management clinic.   Review of Systems Per HPI    Objective:   Physical Exam  Constitutional: She is oriented to [place], place, and time. She appears well-nourished.  HENT:  Head: Normocephalic and atraumatic.  Neck: No thyromegaly present.  Cardiovascular: Normal rate, regular rhythm and normal heart sounds.   Genitourinary: Cervix exhibits no motion tenderness. Vaginal discharge found.  Musculoskeletal:       Significant edema and tenderness of R arm. Pulses normal.   Neurological: She is alert and oriented to [place], place, and time.          Assessment & Plan:

## 2011-12-12 NOTE — Assessment & Plan Note (Signed)
GC/Chlamydia collected.  Unable to perform wet prep today.  Will treat empirically for BV and yeast, if gc/chlamydia negative.

## 2011-12-12 NOTE — Assessment & Plan Note (Signed)
Significan pain related to this, going to lymphedema clinic.  Followed by pain management.  Explained to her that I would not rx narcotic pain medications since she if followed by pain management specialist.

## 2011-12-13 ENCOUNTER — Ambulatory Visit: Payer: Medicaid Other | Admitting: Physical Therapy

## 2011-12-21 ENCOUNTER — Telehealth: Payer: Self-pay | Admitting: Family Medicine

## 2011-12-21 NOTE — Telephone Encounter (Signed)
Is asking for results of her last blood test and wet prep

## 2011-12-21 NOTE — Telephone Encounter (Signed)
Left message for patient to return call, please tell her all labs were normal.Busick, Rodena Medin

## 2011-12-22 ENCOUNTER — Ambulatory Visit: Payer: Medicaid Other | Attending: Physical Medicine and Rehabilitation | Admitting: Physical Therapy

## 2011-12-22 ENCOUNTER — Encounter: Payer: Self-pay | Admitting: Physical Medicine and Rehabilitation

## 2011-12-22 ENCOUNTER — Encounter: Payer: Medicaid Other | Attending: Physical Medicine & Rehabilitation | Admitting: Physical Medicine and Rehabilitation

## 2011-12-22 VITALS — BP 153/85 | HR 104 | Resp 14 | Ht 64.0 in | Wt 270.0 lb

## 2011-12-22 DIAGNOSIS — R0789 Other chest pain: Secondary | ICD-10-CM

## 2011-12-22 DIAGNOSIS — M25619 Stiffness of unspecified shoulder, not elsewhere classified: Secondary | ICD-10-CM | POA: Insufficient documentation

## 2011-12-22 DIAGNOSIS — M25519 Pain in unspecified shoulder: Secondary | ICD-10-CM | POA: Insufficient documentation

## 2011-12-22 DIAGNOSIS — Z923 Personal history of irradiation: Secondary | ICD-10-CM | POA: Insufficient documentation

## 2011-12-22 DIAGNOSIS — M62838 Other muscle spasm: Secondary | ICD-10-CM | POA: Insufficient documentation

## 2011-12-22 DIAGNOSIS — Z9010 Acquired absence of unspecified breast and nipple: Secondary | ICD-10-CM | POA: Insufficient documentation

## 2011-12-22 DIAGNOSIS — Z853 Personal history of malignant neoplasm of breast: Secondary | ICD-10-CM | POA: Insufficient documentation

## 2011-12-22 DIAGNOSIS — R071 Chest pain on breathing: Secondary | ICD-10-CM

## 2011-12-22 DIAGNOSIS — IMO0001 Reserved for inherently not codable concepts without codable children: Secondary | ICD-10-CM | POA: Insufficient documentation

## 2011-12-22 DIAGNOSIS — I10 Essential (primary) hypertension: Secondary | ICD-10-CM | POA: Insufficient documentation

## 2011-12-22 DIAGNOSIS — Z9221 Personal history of antineoplastic chemotherapy: Secondary | ICD-10-CM | POA: Insufficient documentation

## 2011-12-22 DIAGNOSIS — R079 Chest pain, unspecified: Secondary | ICD-10-CM | POA: Insufficient documentation

## 2011-12-22 MED ORDER — PREGABALIN 75 MG PO CAPS: 75.0000 mg | ORAL_CAPSULE | Freq: Two times a day (BID) | ORAL | Status: DC

## 2011-12-22 NOTE — Patient Instructions (Addendum)
Start with PT, do exercises at home, stay active as possible. Start exercising in the pool.

## 2011-12-22 NOTE — Progress Notes (Signed)
Subjective:    Patient ID: Karen Hopkins, female    DOB: 07/31/1975, 36 y.o.   MRN: [id number]  HPI The patient complains about right shoulder pain and right sided chest pain. She has a history of a total mastectomy on the right in September 2011 and subsequently chemotherapy and radiation treatment. Her symptoms started shortly after she received those treatments. The patient is to be in pain and reports that she does not get sufficient relief from her current medications. She states that moving her right arm aggravates the pain and holding her arms to alleviates the symptoms. Patient reports that the prescribed medication, Mobic and Robaxin made her feel sick, therefore she discontinued these medications. She is starting PT today. She states that she has done some exercises and stretches for her right shoulder on her own.  She reports, that she got relief from her knee pain with the voltaren gel to knees.   Pain Inventory Average Pain 7 Pain Right Now 7 My pain is sharp, burning, dull, stabbing, tingling and aching  In the last 24 hours, has pain interfered with the following? General activity 7 Relation with others 7 Enjoyment of life 8 What TIME of day is your pain at its worst? morning evening and night Sleep (in general) Poor  Pain is worse with: walking, bending, sitting, standing and some activites Pain improves with: medication Relief from Meds: 5  Mobility how many minutes can you walk? 15 ability to climb steps?  yes do you drive?  yes  Function disabled: date disabled 2011  Neuro/Psych weakness numbness tingling spasms depression anxiety  Prior Studies Any changes since last visit?  no  Physicians involved in your care Any changes since last visit?  no   Family History  Problem Relation Age of Onset  . Hypertension Mother   . Diabetes type II Father   . Prostate cancer Father    History   Social History  . Marital Status: Single    Spouse Name:  N/A    Number of Children: N/A  . Years of Education: N/A   Social History Main Topics  . Smoking status: Former Smoker    Types: Cigarettes    Quit date: 04/26/2010  . Smokeless tobacco: Never Used  . Alcohol Use: No  . Drug Use: None  . Sexually Active: None   Other Topics Concern  . None   Social History Narrative  . None   Past Surgical History  Procedure Date  . Mastectomy right sided complete  . Hernia repair 2006  . Nasal septum surgery    Past Medical History  Diagnosis Date  . Breast cancer stage 3    radiation/chemo/ right mastectomy  . Seasonal allergies   . Hypertension    BP 153/85  Pulse 104  Resp 14  Ht 5\' 4"  (1.626 m)  Wt 270 lb (122.471 kg)  BMI 46.35 kg/m2  SpO2 97%     Review of Systems  Constitutional: Positive for unexpected weight change.  Respiratory: Positive for shortness of breath.   Musculoskeletal: Positive for myalgias and arthralgias.  Neurological: Positive for weakness and numbness.  Psychiatric/Behavioral: Positive for dysphoric mood. The patient is nervous/anxious.   All other systems reviewed and are negative.       Objective:   Physical Exam Constitutional: She is oriented to [name], place, and time. She appears well-developed.  HENT:  Head: Normocephalic.  Eyes: Pupils are equal, round, and reactive to light.  Neck: Normal range of motion.  Neurological: She is alert and oriented to [name], place, and time.  Skin: Skin is warm and dry.  Psychiatric: She has a normal mood and affect.  Contracture of pectoralis major and minor, scar tissue, external rotation with abduction of right shoulder severely restricted, Abduction is about 90 degrees, external rotation in abduction 30 degrees.  Otherwise strength grossly normal in 4 extremities.        Assessment & Plan:  36 year old female, status post total mastectomy on the right in September 2011, and subsequentially chemotherapy and radiation therapy, after which  she developed right shoulder and right chest pain with contractures of the pectoralis major and minor muscle.  The patient should continue with her current medication.Patient can not switch her Celexa to Cymbalta, because she is on Tamoxifen. I also ordered physical therapy with modalities for stretching and loosening the scar tissue in her right chest and shoulder at the last visit, which she just started today. Prescribed Lyrica for her radiating pain. Patient should follow up in one month.

## 2011-12-24 ENCOUNTER — Telehealth: Payer: Self-pay | Admitting: *Deleted

## 2011-12-24 NOTE — Telephone Encounter (Signed)
Having a reaction to Lyrica, she is "jittery." I have advised her to stop taking the medication for now until she hears from [place]. Please advise.

## 2011-12-24 NOTE — Telephone Encounter (Signed)
Pt aware.

## 2011-12-24 NOTE — Telephone Encounter (Signed)
That's ok, she just started PT with modalities, so hopefully that will give her some relief, she should be fine till next visit.

## 2011-12-24 NOTE — Telephone Encounter (Signed)
Advised pt of neg GC and chlamydia. Stated she still has a yeast infection and wondered if you are going to treat her for that.

## 2011-12-27 ENCOUNTER — Ambulatory Visit: Payer: Medicaid Other | Admitting: Physical Therapy

## 2011-12-27 MED ORDER — FLUCONAZOLE 150 MG PO TABS: 150.0000 mg | ORAL_TABLET | Freq: Once | ORAL | Status: AC

## 2011-12-27 NOTE — Telephone Encounter (Signed)
Will send in rx for diflucan  °

## 2011-12-27 NOTE — Telephone Encounter (Signed)
Advised pt of Rx called to pharmacy.

## 2011-12-28 ENCOUNTER — Other Ambulatory Visit: Payer: Self-pay

## 2011-12-28 DIAGNOSIS — C50919 Malignant neoplasm of unspecified site of unspecified female breast: Secondary | ICD-10-CM

## 2011-12-28 MED ORDER — TRAMADOL HCL 50 MG PO TABS: 50.0000 mg | ORAL_TABLET | Freq: Four times a day (QID) | ORAL | Status: DC | PRN

## 2011-12-29 ENCOUNTER — Ambulatory Visit: Payer: Medicaid Other | Admitting: Physical Therapy

## 2012-01-03 ENCOUNTER — Encounter: Payer: Medicaid Other | Admitting: Physical Therapy

## 2012-01-04 ENCOUNTER — Other Ambulatory Visit: Payer: Self-pay | Admitting: *Deleted

## 2012-01-04 DIAGNOSIS — C50919 Malignant neoplasm of unspecified site of unspecified female breast: Secondary | ICD-10-CM

## 2012-01-04 DIAGNOSIS — F419 Anxiety disorder, unspecified: Secondary | ICD-10-CM

## 2012-01-04 MED ORDER — ALPRAZOLAM ER 0.5 MG PO TB24: 0.5000 mg | ORAL_TABLET | Freq: Two times a day (BID) | ORAL | Status: DC

## 2012-01-05 ENCOUNTER — Ambulatory Visit: Payer: Medicaid Other

## 2012-01-10 ENCOUNTER — Ambulatory Visit: Payer: Medicare Other | Attending: Physical Medicine and Rehabilitation

## 2012-01-10 DIAGNOSIS — IMO0001 Reserved for inherently not codable concepts without codable children: Secondary | ICD-10-CM | POA: Insufficient documentation

## 2012-01-10 DIAGNOSIS — M25619 Stiffness of unspecified shoulder, not elsewhere classified: Secondary | ICD-10-CM | POA: Insufficient documentation

## 2012-01-10 DIAGNOSIS — M25519 Pain in unspecified shoulder: Secondary | ICD-10-CM | POA: Insufficient documentation

## 2012-01-12 ENCOUNTER — Ambulatory Visit: Payer: Medicaid Other | Attending: Physical Medicine and Rehabilitation

## 2012-01-17 ENCOUNTER — Ambulatory Visit: Payer: Medicaid Other

## 2012-01-19 ENCOUNTER — Encounter
Payer: Medicaid Other | Attending: Physical Medicine and Rehabilitation | Admitting: Physical Medicine and Rehabilitation

## 2012-01-19 ENCOUNTER — Ambulatory Visit: Payer: Medicaid Other

## 2012-01-19 ENCOUNTER — Encounter: Payer: Self-pay | Admitting: Physical Medicine and Rehabilitation

## 2012-01-19 VITALS — BP 131/68 | HR 95 | Resp 14 | Ht 64.0 in | Wt 268.0 lb

## 2012-01-19 DIAGNOSIS — C50919 Malignant neoplasm of unspecified site of unspecified female breast: Secondary | ICD-10-CM

## 2012-01-19 DIAGNOSIS — R071 Chest pain on breathing: Secondary | ICD-10-CM

## 2012-01-19 DIAGNOSIS — I10 Essential (primary) hypertension: Secondary | ICD-10-CM | POA: Insufficient documentation

## 2012-01-19 DIAGNOSIS — Z853 Personal history of malignant neoplasm of breast: Secondary | ICD-10-CM | POA: Insufficient documentation

## 2012-01-19 DIAGNOSIS — M25519 Pain in unspecified shoulder: Secondary | ICD-10-CM | POA: Insufficient documentation

## 2012-01-19 DIAGNOSIS — R079 Chest pain, unspecified: Secondary | ICD-10-CM | POA: Insufficient documentation

## 2012-01-19 DIAGNOSIS — Z9221 Personal history of antineoplastic chemotherapy: Secondary | ICD-10-CM | POA: Insufficient documentation

## 2012-01-19 DIAGNOSIS — Z923 Personal history of irradiation: Secondary | ICD-10-CM | POA: Insufficient documentation

## 2012-01-19 DIAGNOSIS — M62838 Other muscle spasm: Secondary | ICD-10-CM | POA: Insufficient documentation

## 2012-01-19 DIAGNOSIS — R0789 Other chest pain: Secondary | ICD-10-CM

## 2012-01-19 DIAGNOSIS — Z9010 Acquired absence of unspecified breast and nipple: Secondary | ICD-10-CM | POA: Insufficient documentation

## 2012-01-19 MED ORDER — CYCLOBENZAPRINE HCL 10 MG PO TABS: 10.0000 mg | ORAL_TABLET | Freq: Three times a day (TID) | ORAL | Status: AC | PRN

## 2012-01-19 NOTE — Progress Notes (Signed)
Subjective:    Patient ID: Karen Hopkins, female    DOB: May 16, 1976, 36 y.o.   MRN: [id number]  HPI The patient complains about right shoulder pain and right sided chest pain. She has a history of a total mastectomy on the right in September 2011 and subsequently chemotherapy and radiation treatment. Her symptoms started shortly after she received those treatments. The patient is to be in pain and reports that she does not get sufficient relief from her current medications. She states that moving her right arm aggravates the pain and holding her arms to alleviates the symptoms. Patient reports that the prescribed medication, Mobic and Robaxin made her feel sick, therefore she discontinued these medications. She is still doing PT . She states that she has done some exercises and stretches for her right shoulder on her own.She also complains about muscle spasms in her right side of her chest.   Pain Inventory Average Pain 7 Pain Right Now 7 My pain is sharp, dull, stabbing, tingling and aching  In the last 24 hours, has pain interfered with the following? General activity 5 Relation with others 4 Enjoyment of life 4 What TIME of day is your pain at its worst? day and night Sleep (in general) Poor  Pain is worse with: walking, bending, standing and some activites Pain improves with: therapy/exercise and medication Relief from Meds: 0  Mobility how many minutes can you walk? 15 ability to climb steps?  yes do you drive?  yes  Function disabled: date disabled 2011  Neuro/Psych weakness numbness spasms depression anxiety  Prior Studies Any changes since last visit?  no  Physicians involved in your care Any changes since last visit?  no   Family History  Problem Relation Age of Onset  . Hypertension Mother   . Diabetes type II Father   . Prostate cancer Father    History   Social History  . Marital Status: Single    Spouse Name: N/A    Number of Children: N/A  .  Years of Education: N/A   Social History Main Topics  . Smoking status: Former Smoker    Types: Cigarettes    Quit date: 04/26/2010  . Smokeless tobacco: Never Used  . Alcohol Use: No  . Drug Use: None  . Sexually Active: None   Other Topics Concern  . None   Social History Narrative  . None   Past Surgical History  Procedure Date  . Mastectomy right sided complete  . Hernia repair 2006  . Nasal septum surgery    Past Medical History  Diagnosis Date  . Breast cancer stage 3    radiation/chemo/ right mastectomy  . Seasonal allergies   . Hypertension    BP 131/68  Pulse 95  Resp 14  Ht 5\' 4"  (1.626 m)  Wt 268 lb (121.564 kg)  BMI 46.00 kg/m2  SpO2 97%     Review of Systems  Musculoskeletal: Positive for myalgias, back pain and arthralgias.  Neurological: Positive for weakness and numbness.  Psychiatric/Behavioral: Positive for dysphoric mood. The patient is nervous/anxious.   All other systems reviewed and are negative.       Objective:   Physical Exam Constitutional: She is oriented to [place], place, and time. She appears well-developed.  HENT:  Head: Normocephalic.  Eyes: Pupils are equal, round, and reactive to light.  Neck: Normal range of motion.  Neurological: She is alert and oriented to [place], place, and time.  Skin: Skin is warm and dry.  Psychiatric: She has a normal mood and affect.  Contracture of pectoralis major and minor, scar tissue, external rotation with abduction of right shoulder severely restricted, Abduction is about 90 degrees, external rotation in abduction 40 degrees.  Otherwise strength grossly normal in 4 extremities.        Assessment & Plan:  36 year old female, status post total mastectomy on the right in September 2011, and subsequentially chemotherapy and radiation therapy, after which she developed right shoulder and right chest pain with contractures of the pectoralis major and minor muscle.  The patient should  continue with her current medication.Patient can not switch her Celexa to Cymbalta, because she is on Tamoxifen. She should continue physical therapy with modalities for stretching and loosening the scar tissue in her right chest and shoulder. Prescribed Lyrica for her radiating pain, which she did not tolerate. Prescribed Flexeril for her muscle spasms, advised patient to not take it with the Xanax she takes in the mornings. Patient should follow up in one month.

## 2012-01-19 NOTE — Patient Instructions (Signed)
Continue with PT, continue with working out in the water.

## 2012-01-27 ENCOUNTER — Ambulatory Visit: Payer: Medicaid Other | Admitting: Physical Therapy

## 2012-01-27 ENCOUNTER — Other Ambulatory Visit: Payer: Self-pay | Admitting: *Deleted

## 2012-01-27 DIAGNOSIS — C50419 Malignant neoplasm of upper-outer quadrant of unspecified female breast: Secondary | ICD-10-CM

## 2012-01-27 MED ORDER — ALPRAZOLAM ER 1 MG PO TB24: 1.0000 mg | ORAL_TABLET | ORAL | Status: AC

## 2012-02-03 ENCOUNTER — Ambulatory Visit: Payer: Medicaid Other | Admitting: Physical Therapy

## 2012-02-03 ENCOUNTER — Telehealth: Payer: Self-pay | Admitting: Physical Medicine & Rehabilitation

## 2012-02-03 NOTE — Telephone Encounter (Signed)
This message was from physical therapist. [facility]

## 2012-02-03 NOTE — Telephone Encounter (Signed)
Needs signed approval for TENS unit. Patient coming in today.  Patient of PA.

## 2012-02-04 NOTE — Telephone Encounter (Signed)
Ok will sign when she comes

## 2012-02-09 ENCOUNTER — Telehealth: Payer: Self-pay | Admitting: Family Medicine

## 2012-02-09 ENCOUNTER — Other Ambulatory Visit: Payer: Self-pay | Admitting: Family Medicine

## 2012-02-09 ENCOUNTER — Encounter: Payer: Self-pay | Admitting: Family Medicine

## 2012-02-09 ENCOUNTER — Ambulatory Visit: Payer: Medicare Other | Admitting: Physical Therapy

## 2012-02-09 ENCOUNTER — Ambulatory Visit (INDEPENDENT_AMBULATORY_CARE_PROVIDER_SITE_OTHER): Payer: Medicaid Other | Admitting: Family Medicine

## 2012-02-09 VITALS — BP 130/88 | HR 93 | Temp 98.1°F | Ht 64.0 in | Wt 270.0 lb

## 2012-02-09 DIAGNOSIS — J069 Acute upper respiratory infection, unspecified: Secondary | ICD-10-CM

## 2012-02-09 MED ORDER — HYDROCODONE-HOMATROPINE 5-1.5 MG/5ML PO SYRP: 5.0000 mL | ORAL_SOLUTION | Freq: Three times a day (TID) | ORAL | Status: AC | PRN

## 2012-02-09 MED ORDER — ALBUTEROL SULFATE HFA 108 (90 BASE) MCG/ACT IN AERS: 2.0000 | INHALATION_SPRAY | Freq: Four times a day (QID) | RESPIRATORY_TRACT | Status: DC | PRN

## 2012-02-09 MED ORDER — ALBUTEROL SULFATE (2.5 MG/3ML) 0.083% IN NEBU: 2.5000 mg | INHALATION_SOLUTION | Freq: Four times a day (QID) | RESPIRATORY_TRACT | Status: DC | PRN

## 2012-02-09 NOTE — Telephone Encounter (Signed)
Pt was given solution for a nebulizer but doesn't have the machine -  Wants to know if one can be sent in to [facility]

## 2012-02-09 NOTE — Telephone Encounter (Signed)
Should have been for [facility] inhaler, Rx changed.  Please let patient know.

## 2012-02-09 NOTE — Patient Instructions (Addendum)
Thank you for coming in today, it was good to see you Try the cough syrup to help with your cough.  This may make you sleepy. Use the albuterol for shortness of breath. If you are not getting better or start to get worse over the next week, return to see me.

## 2012-02-09 NOTE — Telephone Encounter (Signed)
Called and informed patient Rx changed and sent to pharmacy.Busick, Rodena Medin

## 2012-02-09 NOTE — Telephone Encounter (Signed)
Patient needs Rx for nebulizer machine, needs to be sent to [facility]. Put in order and I can forward to [facility] through epic

## 2012-02-14 ENCOUNTER — Ambulatory Visit: Payer: Medicare Other | Attending: Physical Medicine and Rehabilitation | Admitting: Physical Therapy

## 2012-02-14 DIAGNOSIS — M25519 Pain in unspecified shoulder: Secondary | ICD-10-CM | POA: Insufficient documentation

## 2012-02-14 DIAGNOSIS — IMO0001 Reserved for inherently not codable concepts without codable children: Secondary | ICD-10-CM | POA: Insufficient documentation

## 2012-02-14 DIAGNOSIS — M25619 Stiffness of unspecified shoulder, not elsewhere classified: Secondary | ICD-10-CM | POA: Insufficient documentation

## 2012-02-14 DIAGNOSIS — J069 Acute upper respiratory infection, unspecified: Secondary | ICD-10-CM | POA: Insufficient documentation

## 2012-02-14 NOTE — Assessment & Plan Note (Signed)
Symptomatic therapy suggested: push fluids, rest, return office visit prn if symptoms persist or worsen and cough syrup with hydrocodone given to be used for nighttime cough. Lack of antibiotic effectiveness discussed with her. Call or return to clinic prn if these symptoms worsen or fail to improve as anticipated.

## 2012-02-14 NOTE — Progress Notes (Signed)
Patient ID: Karen Hopkins, female   DOB: 1976/03/30, 36 y.o.   MRN: [id number] SUBJECTIVE:  Karen Hopkins is a 36 y.o. female who complains of congestion, nasal blockage, post nasal drip, cough described as productive of yellow sputum, headache, mild dyspnea with cough and bilateral sinus pain for 3 days. She denies a history of chest pain, chills, fevers, sweats and wheezing and denies a history of asthma. Patient does not smoke cigarettes.   OBJECTIVE: She appears well, vital signs are as noted. Ears normal.  Throat and pharynx normal.  Neck supple. No adenopathy in the neck. Nose is congested. Sinuses non tender. The chest is clear, without wheezes or rales.  ASSESSMENT:  viral upper respiratory illness  PLAN: Symptomatic therapy suggested: push fluids, rest, return office visit prn if symptoms persist or worsen and cough syrup with hydrocodone given to be used for nighttime cough. Lack of antibiotic effectiveness discussed with her. Call or return to clinic prn if these symptoms worsen or fail to improve as anticipated.

## 2012-02-16 ENCOUNTER — Ambulatory Visit (INDEPENDENT_AMBULATORY_CARE_PROVIDER_SITE_OTHER): Payer: Medicare Other | Admitting: Family Medicine

## 2012-02-16 ENCOUNTER — Telehealth: Payer: Self-pay | Admitting: Family Medicine

## 2012-02-16 ENCOUNTER — Ambulatory Visit: Payer: Medicare Other | Admitting: Physical Therapy

## 2012-02-16 ENCOUNTER — Ambulatory Visit
Admission: RE | Admit: 2012-02-16 | Discharge: 2012-02-16 | Disposition: A | Discharge status: Home / Self Care | Payer: Medicare Other | Source: Ambulatory Visit | Attending: Family Medicine | Admitting: Family Medicine

## 2012-02-16 VITALS — BP 127/89 | HR 98 | Temp 98.7°F | Ht 64.0 in | Wt 267.0 lb

## 2012-02-16 DIAGNOSIS — J069 Acute upper respiratory infection, unspecified: Secondary | ICD-10-CM

## 2012-02-16 MED ORDER — AZITHROMYCIN 500 MG PO TABS: 500.0000 mg | ORAL_TABLET | Freq: Every day | ORAL | Status: DC

## 2012-02-16 NOTE — Telephone Encounter (Signed)
Patient is scheduled this afternoon with Dr Ashley Royalty.Busick, Rodena Medin

## 2012-02-16 NOTE — Telephone Encounter (Signed)
Patient called back with a different number.  It is [phone].

## 2012-02-16 NOTE — Patient Instructions (Addendum)
Thank you for coming in today, it was good to see you I would like for you to go get your chest x ray I will send in a prescription for an antibiotic for you Follow up with me in one week

## 2012-02-16 NOTE — Telephone Encounter (Signed)
Patient is calling because she is having continued cough and congestion, add sore throat now.  She said she was told that when she was seen last week, she was told that if she wasn't better to call back and an antibiotic was sent in.

## 2012-02-18 ENCOUNTER — Encounter: Payer: Medicaid Other | Admitting: Physical Medicine and Rehabilitation

## 2012-02-18 ENCOUNTER — Encounter: Payer: Medicaid Other | Admitting: Nutrition

## 2012-02-21 ENCOUNTER — Ambulatory Visit: Payer: Medicare Other | Admitting: Physical Therapy

## 2012-02-21 ENCOUNTER — Encounter: Payer: Medicare Other | Admitting: Physical Medicine and Rehabilitation

## 2012-02-22 ENCOUNTER — Ambulatory Visit (INDEPENDENT_AMBULATORY_CARE_PROVIDER_SITE_OTHER): Payer: Medicare Other | Admitting: Family Medicine

## 2012-02-22 ENCOUNTER — Encounter
Payer: Medicare Other | Attending: Physical Medicine and Rehabilitation | Admitting: Physical Medicine and Rehabilitation

## 2012-02-22 ENCOUNTER — Encounter: Payer: Self-pay | Admitting: Physical Medicine and Rehabilitation

## 2012-02-22 ENCOUNTER — Encounter: Payer: Self-pay | Admitting: Family Medicine

## 2012-02-22 VITALS — BP 145/88 | HR 106 | Temp 98.3°F | Ht 64.0 in | Wt 271.0 lb

## 2012-02-22 VITALS — BP 147/81 | HR 90 | Resp 18 | Ht 64.0 in | Wt 273.0 lb

## 2012-02-22 DIAGNOSIS — R071 Chest pain on breathing: Secondary | ICD-10-CM

## 2012-02-22 DIAGNOSIS — M62838 Other muscle spasm: Secondary | ICD-10-CM | POA: Insufficient documentation

## 2012-02-22 DIAGNOSIS — M25519 Pain in unspecified shoulder: Secondary | ICD-10-CM | POA: Insufficient documentation

## 2012-02-22 DIAGNOSIS — Z9010 Acquired absence of unspecified breast and nipple: Secondary | ICD-10-CM | POA: Insufficient documentation

## 2012-02-22 DIAGNOSIS — C50919 Malignant neoplasm of unspecified site of unspecified female breast: Secondary | ICD-10-CM

## 2012-02-22 DIAGNOSIS — R05 Cough: Secondary | ICD-10-CM

## 2012-02-22 DIAGNOSIS — R0789 Other chest pain: Secondary | ICD-10-CM | POA: Insufficient documentation

## 2012-02-22 MED ORDER — BENZONATATE 200 MG PO CAPS: 200.0000 mg | ORAL_CAPSULE | Freq: Two times a day (BID) | ORAL | Status: DC | PRN

## 2012-02-22 MED ORDER — HYDROCODONE-ACETAMINOPHEN 5-325 MG PO TABS: 1.0000 | ORAL_TABLET | Freq: Three times a day (TID) | ORAL | Status: DC | PRN

## 2012-02-22 MED ORDER — DICLOFENAC SODIUM 1 % TD GEL: 2.0000 g | Freq: Four times a day (QID) | TRANSDERMAL | Status: DC

## 2012-02-22 NOTE — Progress Notes (Addendum)
Subjective:    Patient ID: Karen Hopkins, female    DOB: 01-14-1976, 36 y.o.   MRN: [id number]  HPI The patient complains about right shoulder pain and right sided chest pain. She has a history of a total mastectomy on the right in September 2011 and subsequently chemotherapy and radiation treatment. Her symptoms started shortly after she received those treatments. The patient is to be in pain and reports that she does not get sufficient relief from her current medications. She states that moving her right arm aggravates the pain and holding her arms to alleviates the symptoms. Patient reports that the prescribed medication, Mobic, Flexeril and Robaxin made her feel sick, therefore she discontinued these medications. She is still doing PT . She states that she has done some exercises and stretches for her right shoulder on her own.She states, that as a mother of 3 small children she has to be very active, and the pain interferes with her responsibilities, she states, that the Ultram we tried in the past has not given her relief. She reports that she was on hydrocodone before, which she tolerated , and which helped her with her daily functioning.  Pain Inventory Average Pain 7 Pain Right Now 8 My pain is sharp, burning, dull, stabbing, tingling and aching  In the last 24 hours, has pain interfered with the following? General activity 3 Relation with others 3 Enjoyment of life 2 What TIME of day is your pain at its worst? All Day Sleep (in general) Poor  Pain is worse with: walking, bending, standing and some activites Pain improves with: heat/ice, therapy/exercise and medication Relief from Meds: No medication  Mobility walk without assistance how many minutes can you walk? 10 ability to climb steps?  yes do you drive?  yes  Function disabled: date disabled   Neuro/Psych bladder control problems weakness numbness tingling spasms depression anxiety  Prior  Studies x-rays  Physicians involved in your care Any changes since last visit?  no   Family History  Problem Relation Age of Onset  . Hypertension Mother   . Diabetes type II Father   . Prostate cancer Father    History   Social History  . Marital Status: Single    Spouse Name: N/A    Number of Children: N/A  . Years of Education: N/A   Social History Main Topics  . Smoking status: Former Smoker    Types: Cigarettes    Quit date: 04/26/2010  . Smokeless tobacco: Never Used  . Alcohol Use: No  . Drug Use: None  . Sexually Active: None   Other Topics Concern  . None   Social History Narrative  . None   Past Surgical History  Procedure Date  . Mastectomy right sided complete  . Hernia repair 2006  . Nasal septum surgery    Past Medical History  Diagnosis Date  . Breast cancer stage 3    radiation/chemo/ right mastectomy  . Seasonal allergies   . Hypertension    BP 147/81  Pulse 90  Resp 18  Ht 5\' 4"  (1.626 m)  Wt 273 lb (123.832 kg)  BMI 46.86 kg/m2  SpO2 97%      Review of Systems  HENT: Negative.   Eyes: Negative.   Respiratory: Positive for cough, shortness of breath and wheezing.   Cardiovascular: Negative.   Gastrointestinal: Negative.   Genitourinary: Positive for urgency.  Musculoskeletal: Positive for myalgias and arthralgias.  Skin: Negative.   Neurological: Positive for weakness and  numbness.       Tingling, Spasms  Hematological: Negative.   Psychiatric/Behavioral:       Depression/Anxiety       Objective:   Physical Exam Constitutional: She is oriented to [place], place, and time. She appears well-developed.  HENT:  Head: Normocephalic.  Eyes: Pupils are equal, round, and reactive to light.  Neck: Normal range of motion.  Neurological: She is alert and oriented to [place], place, and time.  Skin: Skin is warm and dry.  Psychiatric: She has a normal mood and affect.  Contracture of pectoralis major and minor, scar tissue,  external rotation with abduction of right shoulder severely restricted, Abduction is about 90 degrees, external rotation in abduction almost 90 degrees today.  Otherwise strength grossly normal in 4 extremities.        Assessment & Plan:  36 year old female, status post total mastectomy on the right in September 2011, and subsequentially chemotherapy and radiation therapy, after which she developed right shoulder and right chest pain with contractures of the pectoralis major and minor muscle.  The patient should continue with her current medication.Patient can not switch her Celexa to Cymbalta, because she is on Tamoxifen. She should continue physical therapy with modalities for stretching and loosening the scar tissue in her right chest and shoulder. Prescribed Lyrica for her radiating pain, which she did not tolerate. Prescribed Flexeril for her muscle spasms, which did not help much, patient d/ced Flexeril.  Prescribed Hydrocodone 5mg  #60, to improve her functioning, Patient was on this medication before, prescribed by her oncologist , she tolerated the medication well, and was better functioning. She is the mother of 3 and states, that her pain sometimes interferes with her daily responsibilities.  She should continue using the TENS unit, which gives her relief  Patient should follow up in one month.   Also referred patient to pain psychologist to learn strategies for better pain management.

## 2012-02-22 NOTE — Patient Instructions (Signed)
Continue with PT, follow up with a pain psychologist. Do exercises and stretches at home.

## 2012-02-23 ENCOUNTER — Ambulatory Visit: Payer: Medicare Other | Admitting: Physical Therapy

## 2012-02-23 NOTE — Progress Notes (Signed)
  Subjective:    Patient ID: Karen Hopkins, female    DOB: 07-29-1975, 36 y.o.   MRN: [id number]  HPI  1. Cough:  Here with continued cough and congestion.  Congestion has improved, but cough still persists. Still with some mild shortness of breath although albuterol has helped.  Cough is still productive.  Cough syrup that has been prescribed for her has not helped much and cough is still keeping her up at night.  Denies fever, chills, chest pain, nausea or vomiting.  Review of Systems Per HPI    Objective:   Physical Exam  Constitutional: She appears well-nourished. No distress.  HENT:  Mouth/Throat: Oropharynx is clear and moist.  Eyes: Conjunctivae normal are normal.  Neck: Neck supple.  Cardiovascular: Normal rate and regular rhythm.   Pulmonary/Chest: Effort normal. She has wheezes (mild expiratory ).  Musculoskeletal: She exhibits no edema.          Assessment & Plan:

## 2012-02-23 NOTE — Assessment & Plan Note (Signed)
Given worsening nature persistence of cough and continued wheezing, will treat with azithromycin and obtain CxR to assess for pna.  Return in 7-10 days if still not improving.

## 2012-02-24 ENCOUNTER — Other Ambulatory Visit (HOSPITAL_BASED_OUTPATIENT_CLINIC_OR_DEPARTMENT_OTHER): Payer: Medicare Other | Admitting: Lab

## 2012-02-24 DIAGNOSIS — C50919 Malignant neoplasm of unspecified site of unspecified female breast: Secondary | ICD-10-CM

## 2012-02-24 LAB — CBC WITH DIFFERENTIAL/PLATELET
BASO%: 0.3 % (ref 0.0–2.0)
Basophils Absolute: 0 10*3/uL (ref 0.0–0.1)
EOS%: 6.4 % (ref 0.0–7.0)
HGB: 12.7 g/dL (ref 11.6–15.9)
MCH: 29.3 pg (ref 25.1–34.0)
RBC: 4.32 10*6/uL (ref 3.70–5.45)
RDW: 14.5 % (ref 11.2–14.5)
lymph#: 2.3 10*3/uL (ref 0.9–3.3)

## 2012-02-24 LAB — COMPREHENSIVE METABOLIC PANEL (CC13)
ALT: 19 U/L (ref 0–55)
AST: 17 U/L (ref 5–34)
Albumin: 3.4 g/dL — ABNORMAL LOW (ref 3.5–5.0)
BUN: 10 mg/dL (ref 7.0–26.0)
Calcium: 9.3 mg/dL (ref 8.4–10.4)
Chloride: 105 mEq/L (ref 98–107)
Potassium: 3.5 mEq/L (ref 3.5–5.1)
Sodium: 140 mEq/L (ref 136–145)
Total Protein: 6.9 g/dL (ref 6.4–8.3)

## 2012-02-27 DIAGNOSIS — R05 Cough: Secondary | ICD-10-CM | POA: Insufficient documentation

## 2012-02-27 NOTE — Assessment & Plan Note (Signed)
OTher sx of URI have resolved but still with persistent cough.  I discussed with her that cough may remain for a few weeks.  Will give rx for tessalon to see if this gives her some relief at night.

## 2012-02-27 NOTE — Progress Notes (Signed)
  Subjective:    Patient ID: Karen Hopkins, female    DOB: 02-Jan-1976, 36 y.o.   MRN: [id number]  HPI 1. Cough:  Still with cough.  Congestion and other symptoms have resolved but cough is still keeping her up at night.  She tried tussionex previously but did not get much relief.  She has not had any sputum production.  Previous xray with bronchitic changes. She denies fever, chills, shortness of breath, chest pain, nausea.   Review of Systems Per HPI    Objective:   Physical Exam  Constitutional:       Obese, nad   HENT:  Head: Normocephalic and atraumatic.  Mouth/Throat: Oropharynx is clear and moist.  Eyes: Conjunctivae normal are normal.  Neck: Neck supple.  Cardiovascular: Normal rate, regular rhythm and normal heart sounds.   Pulmonary/Chest: Effort normal and breath sounds normal. No respiratory distress. She has no wheezes.  Musculoskeletal: She exhibits no edema.  Lymphadenopathy:    She has no cervical adenopathy.  Neurological: She is alert.          Assessment & Plan:

## 2012-02-28 ENCOUNTER — Ambulatory Visit: Payer: Medicare Other | Admitting: Physical Therapy

## 2012-03-01 ENCOUNTER — Ambulatory Visit: Payer: Medicare Other

## 2012-03-01 ENCOUNTER — Other Ambulatory Visit: Payer: Self-pay | Admitting: *Deleted

## 2012-03-01 ENCOUNTER — Other Ambulatory Visit: Payer: Self-pay | Admitting: Physician Assistant

## 2012-03-01 DIAGNOSIS — C50919 Malignant neoplasm of unspecified site of unspecified female breast: Secondary | ICD-10-CM

## 2012-03-02 ENCOUNTER — Ambulatory Visit (HOSPITAL_BASED_OUTPATIENT_CLINIC_OR_DEPARTMENT_OTHER): Payer: Medicare Other | Admitting: Physician Assistant

## 2012-03-02 ENCOUNTER — Ambulatory Visit: Payer: Medicare Other | Admitting: Lab

## 2012-03-02 ENCOUNTER — Encounter: Payer: Self-pay | Admitting: Physician Assistant

## 2012-03-02 ENCOUNTER — Other Ambulatory Visit: Payer: Self-pay | Admitting: *Deleted

## 2012-03-02 VITALS — BP 127/86 | HR 101 | Temp 98.3°F | Resp 20 | Ht 64.0 in | Wt 272.1 lb

## 2012-03-02 DIAGNOSIS — N912 Amenorrhea, unspecified: Secondary | ICD-10-CM

## 2012-03-02 DIAGNOSIS — R05 Cough: Secondary | ICD-10-CM

## 2012-03-02 DIAGNOSIS — F411 Generalized anxiety disorder: Secondary | ICD-10-CM

## 2012-03-02 DIAGNOSIS — C50919 Malignant neoplasm of unspecified site of unspecified female breast: Secondary | ICD-10-CM

## 2012-03-02 DIAGNOSIS — C50419 Malignant neoplasm of upper-outer quadrant of unspecified female breast: Secondary | ICD-10-CM

## 2012-03-02 DIAGNOSIS — C773 Secondary and unspecified malignant neoplasm of axilla and upper limb lymph nodes: Secondary | ICD-10-CM

## 2012-03-02 DIAGNOSIS — I890 Lymphedema, not elsewhere classified: Secondary | ICD-10-CM

## 2012-03-02 DIAGNOSIS — G8918 Other acute postprocedural pain: Secondary | ICD-10-CM

## 2012-03-02 MED ORDER — TAMOXIFEN CITRATE 20 MG PO TABS: 20.0000 mg | ORAL_TABLET | Freq: Every day | ORAL | Status: DC

## 2012-03-02 MED ORDER — TRIAMTERENE-HCTZ 37.5-25 MG PO TABS: 1.0000 | ORAL_TABLET | Freq: Every day | ORAL | Status: DC

## 2012-03-02 MED ORDER — CITALOPRAM HYDROBROMIDE 20 MG PO TABS: 20.0000 mg | ORAL_TABLET | Freq: Every day | ORAL | Status: DC

## 2012-03-02 NOTE — Progress Notes (Signed)
ID: Karen Hopkins   DOB: 27-Apr-1976  MR#: [id number]  NWG#:[id number]  HISTORY OF PRESENT ILLNESS: The patient noted swelling and redness, as well as some tenderness, over her right breast for some time. She thought this might be related to pregnancy, since she delivered her third child about 8 months ago. As the problem did not improve, she brought it to her primary care physician's attention. He treated her with Bactrim, which she says she could not tolerate because of nausea and vomiting, but which in any case did not resolve the problem, so he set her up for mammography at [facility] 08/26/2009. Dr. Deboraha Hopkins found by exam marked skin thickening and erythema of the right breast extending pretty much throughout the breast. By mammography there were scattered fibroglandular densities, and an asymmetric density in the right upper outer quadrant with suspicious microcalcifications. There were also enlarged right axillary lymph nodes. The left appeared normal. By ultrasound there was an ill defined area of hypoechoic tissue in the right upper outer quadrant that was difficult to measure. There were also abnormal right axillary lymph nodes.  Ultrasound guided biopsy was performed the same day, and showed [phone]) a poorly differentiated invasive ductal carcinoma, which was estrogen receptor poor at 3%, progesterone receptor positive at 14% with an elevated proliferation marker at 50%, but importantly with strong amplification of HER-2 by CISH with a ratio of 5.20.  With this information, the patient was referred to Dr. Jamey Hopkins, and bilateral breast MRIs were obtained 09/02/2009. This showed an area up to 10.6 cm in the right breast showing confluent mass and non-mass enhancement. There was marked skin thickening involving the entire right breast, and numerous bulky right axillary lymph nodes were identified. The left breast was unremarkable, and there was no evidence of internal mammary lymph node  involvement.  Patient was treated in the neoadjuvant setting with 4 dose dense cycles of doxorubicin and cyclophosphamide, followed by 12 weekly doses of paclitaxel and trastuzumab. Trastuzumab was continued for a total of one year. Following one year, an echocardiogram did in fact show well preserved ejection fraction.  Patient status post definitive right modified radical mastectomy in October 2011, with a residual 2.5 mm area of tumor, grade 3, with one of 21 lymph nodes involved. Tumor was again ER and PR positive.  Received postmastectomy radiation, completed in January 2012, at which time she began on tamoxifen at 20 mg daily.  Patient is known to be BRCA1 and BRCA2 negative.  Patient has a history of chronic postsurgical pain in the right chest wall, right axillary region, and right shoulder, in addition to chronic lymphedema in the right upper extremity.  INTERVAL HISTORY: Karen Hopkins returns today for followup of her right breast carcinoma. She continues on tamoxifen which she is tolerating well.  Karen Hopkins continues to be followed by the pain clinic. Unfortunately, the pain is still disease biggest complaint. They've tried multiple medications with minimal relief, and she is currently taking hydrocodone/APAP. She continues to receive physical therapy at the lymphedema clinic as well.  Interval history is also remarkable for recent upper respiratory infection treated with a course of azithromycin. She continues to have a significant cough. Chest x-ray on 02/16/2012 showed no pneumonia and questionable bronchitis. She denies any fevers or chills.  Karen Hopkins continues to struggle with some depression and anxiety although she denies any suicidal ideations. She is interested in speaking to one of our counselors. She continues to care for her 3 young children, ages 11, 72, and  10. They obviously keep her extremely busy.   REVIEW OF SYSTEMS: Karen Hopkins has occasional hot flashes. She has some vaginal discharge,  previously clear and nonodorous.  She has not had a menstrual cycle "in months" and is sexually active. She's had no nausea or emesis and denies any change in bowel habits. She has shortness of breath with exertion. She has fatigue. She denies abnormal headaches or dizziness.  A detailed review of systems is otherwise noncontributory.  PAST MEDICAL HISTORY: Past Medical History  Diagnosis Date  . Breast cancer stage 3    radiation/chemo/ right mastectomy  . Seasonal allergies   . Hypertension     PAST SURGICAL HISTORY: Past Surgical History  Procedure Date  . Mastectomy right sided complete  . Hernia repair 2006  . Nasal septum surgery     FAMILY HISTORY Family History  Problem Relation Age of Onset  . Hypertension Mother   . Diabetes type II Father   . Prostate cancer Father   There is no history of breast or ovarian cancer in the immediate family, but of the patient's mother's mother's six sisters, two (the patient's great-aunts) had ovarian cancer.  GYNECOLOGIC HISTORY: The patient is GX, P3. First child was premature. Age at first delivery, 36 years old.   SOCIAL HISTORY: She is currently disabled. Karen Hopkins's children are Karen Hopkins, and Karen Hopkins. They are all boys, all at home. The patient attends a non-denominational church in [place], which she considers her home, but she is currently living in [place].   ADVANCED DIRECTIVES:  HEALTH MAINTENANCE: History  Substance Use Topics  . Smoking status: Former Smoker    Types: Cigarettes    Quit date: 04/26/2010  . Smokeless tobacco: Never Used  . Alcohol Use: No     Colonoscopy: Never  PAP: 2012  Bone density: Never  Lipid panel:  Allergies  Allergen Reactions  . Lyrica (Pregabalin)   . Meloxicam Nausea Only  . Penicillins     REACTION: Facial swelling  . Robaxin (Methocarbamol) Nausea Only    Current Outpatient Prescriptions  Medication Sig Dispense Refill  . albuterol (PROVENTIL HFA;VENTOLIN HFA)  108 (90 BASE) MCG/ACT inhaler Inhale 2 puffs into the lungs every 6 (six) hours as needed for wheezing.  1 Inhaler  1  . ALPRAZolam (XANAX XR) 1 MG 24 hr tablet TAKE 1 TABLET BY MOUTH EVERY MORNING  30 tablet  0  . benzonatate (TESSALON) 200 MG capsule Take 1 capsule (200 mg total) by mouth 2 (two) times daily as needed for cough.  30 capsule  0  . citalopram (CELEXA) 20 MG tablet Take 1 tablet (20 mg total) by mouth daily.  30 tablet  5  . diclofenac sodium (VOLTAREN) 1 % GEL Apply 2 g topically 4 (four) times daily.  2 Tube  2  . HYDROcodone-acetaminophen (NORCO) 5-325 MG per tablet Take 1 tablet by mouth every 8 (eight) hours as needed for pain.  60 tablet  0  . naproxen (NAPROSYN) 500 MG tablet       . tamoxifen (NOLVADEX) 20 MG tablet Take 1 tablet (20 mg total) by mouth daily.  30 tablet  5  . traMADol (ULTRAM) 50 MG tablet Take 1 tablet (50 mg total) by mouth every 6 (six) hours as needed.  120 tablet  3  . triamterene-hydrochlorothiazide (MAXZIDE-25) 37.5-25 MG per tablet Take 1 each (1 tablet total) by mouth daily.  30 tablet  5  . DISCONTD: citalopram (CELEXA) 20 MG tablet Take 1 tablet (20  mg total) by mouth daily.  30 tablet  5  . DISCONTD: triamterene-hydrochlorothiazide (MAXZIDE-25) 37.5-25 MG per tablet Take 1 each (1 tablet total) by mouth daily.  30 tablet  5  . azithromycin (ZITHROMAX) 500 MG tablet Take 1 tablet (500 mg total) by mouth daily. For 5 days  5 tablet  0    OBJECTIVE: Young [place] American woman who appears anxious and slightly uncomfortable, but in no acute distress. Filed Vitals:   03/02/12 0940  BP: 127/86  Pulse: 101  Temp: 98.3 F (36.8 C)  Resp: 20     Body mass index is 46.71 kg/(m^2).    ECOG FS: 2  Filed Weights   03/02/12 0940  Weight: 272 lb 1.6 oz (123.424 kg)    Physical Exam: HEENT:  Sclerae anicteric.  Oropharynx clear.   Nodes:  No cervical, supraclavicular, or axillary lymphadenopathy palpated.  Breast Exam:  Patient is status post  right mastectomy. There is hyperpigmentation secondary to postmastectomy radiation, but no suspicious skin changes. No suspicious nodules and evidence of recurrent disease. Left breast is unremarkable. Axillae are benign bilaterally with no adenopathy. Lungs:  Clear to auscultation bilaterally.  No crackles, rhonchi, or wheezes.   Heart:  Regular rate and rhythm.   Abdomen:  Soft, obese, nontender.  Positive bowel sounds.   Musculoskeletal: No significant focal spinal tenderness to palpation  Extremities:  Lymphedema noted in the right upper extremity with compression sleeve in place. No additional peripheral edema noted. Neuro:  Nonfocal. Alert and oriented x3    LAB RESULTS: Lab Results  Component Value Date   WBC 7.9 02/24/2012   NEUTROABS 4.6 02/24/2012   HGB 12.7 02/24/2012   HCT 38.3 02/24/2012   MCV 88.8 02/24/2012   PLT 234 02/24/2012      Chemistry      Component Value Date/Time   NA 140 02/24/2012 1101   NA 138 11/24/2011 0903   K 3.5 02/24/2012 1101   K 3.2* 11/24/2011 0903   CL 105 02/24/2012 1101   CL 103 11/24/2011 0903   CO2 27 02/24/2012 1101   CO2 27 11/24/2011 0903   BUN 10.0 02/24/2012 1101   BUN 13 11/24/2011 0903   CREATININE 0.8 02/24/2012 1101   CREATININE 0.58 11/24/2011 0903      Component Value Date/Time   CALCIUM 9.3 02/24/2012 1101   CALCIUM 9.1 11/24/2011 0903   ALKPHOS 67 02/24/2012 1101   ALKPHOS 65 11/24/2011 0903   AST 17 02/24/2012 1101   AST 13 11/24/2011 0903   ALT 19 02/24/2012 1101   ALT 13 11/24/2011 0903   BILITOT 0.30 02/24/2012 1101   BILITOT 0.1* 11/24/2011 0903       Lab Results  Component Value Date   LABCA2 22 11/24/2011     STUDIES:  Dg Chest 2 View  02/16/2012  *RADIOLOGY REPORT*  Clinical Data: Cough, wheezing for several weeks, history of breast carcinoma  CHEST - 2 VIEW  Comparison: Chest x-ray of 04/14/2010  Findings: No active infiltrate or effusion is seen.  There is some peribronchial thickening which may indicate bronchitis.   Surgical clips overlie the right axilla.  Mediastinal contours appear stable.  The heart is within normal limits in size.  No bony abnormality is seen.  IMPRESSION: No pneumonia.  Question bronchitis.   Original Report Authenticated By: Juline Patch, M.D.    10/07/2011 DG SCREENING MAMMOGRAM LEFT  CC and MLO view(s) were taken of the left breast.  LEFT DIGITAL SCREENING MAMMOGRAM WITH  CAD:  There are scattered fibroglandular densities. No masses or malignant type calcifications are  identified. Compared with prior studies.  Images were processed with CAD.  IMPRESSION:  No specific mammographic evidence of malignancy. Next screening mammogram is recommended in one  year.  A result letter of this screening mammogram will be mailed directly to the patient.  ASSESSMENT: Negative - BI-RADS 1  Screening mammogram in 1 year.   ASSESSMENT: 36 year old BRCA1 and BRCA2 negative  woman, status post right breast biopsy in March 2011 for a high-grade invasive ductal carcinoma, ER positive 3%, PR positive 14%, strongly HER2/neu positive with MIB-1 of 15%. Biopsy proven lymph node involvement at presentation. Changes consistent with inflammatory carcinoma.  1. Treated neoadjuvantly, status post 4 dose-dense cycles of doxorubicin/cyclophosphamide, followed by 12 weekly doses of paclitaxel with trastuzumab.  2. Trastuzumab was then continued for a total of 1 year. Following 1 year, an echo showed well-preserved ejection fraction.  3. Status post definitive right modified radical mastectomy in October 2011 showing a residual 2.5 mm area of tumor, grade 3, 1 of 21 lymph nodes involved. Again, ER positive, PR positive.  4. Received postmastectomy radiation, completed in January 2012, at which time she began on tamoxifen.  5. Now with chronic postsurgical pain in the right chest wall, right axillary region, and right shoulder. Also with chronic lymphedema in the right upper extremity.   PLAN: With  regards to her breast cancer, [place] is doing well, and there is no clinical evidence of disease recurrence at this time. Due to her other issues, including her anxiety and postsurgical pain, we will continue seeing her for now on a every 3 month basis, and she will see [place] again in December. She'll continue to followup with the pain clinic as well as the lymphedema clinic.   I encouraged her to continue with the Tessalon, and possibly add Mucinex in the morning with Robitussin-DM in the evening for the cough. She'll contact her primary care practitioner with any increased problems, and specifically with fever.  Mayuri will continue on her tamoxifen which I have refilled today. I've also refilled her Celexa, her Maxide, and her Xanax ask our. She'll continue on Effexor XR at 1 mg each morning, and would also like to speak with one of our counselors. I have e-mailed her contact information to our chaplain, Lenell Antu. (Her contact number is [phone])  We again discussed the need for barrier form of birth control if Marilu is sexually active. She would like to obtain a pregnancy test today, and that has been ordered accordingly.  Danika voices understanding and agreement with this plan, and will call any changes or problems.   Arsh Feutz    03/02/2012

## 2012-03-02 NOTE — Patient Instructions (Signed)
Continue on tamoxifen daily.  Refill sent to [facility]  Continue Xanax XR, 1 mg each morning.  Refill sent to [facility]  We will not plan on refilling Xanax, only Xanax XR in future.  Continue seeing Clydie Braun at [facility]  Try Mucinex in Am and Robitussin DM at night for cough, in addition to [employer].  Return in 3 months for labs and followup.  Call with problems or questions [phone]

## 2012-03-02 NOTE — Telephone Encounter (Signed)
Sent patient back to the lab

## 2012-03-06 ENCOUNTER — Ambulatory Visit: Payer: Medicare Other | Admitting: Physical Therapy

## 2012-03-08 ENCOUNTER — Ambulatory Visit: Payer: Medicare Other | Attending: Physical Medicine and Rehabilitation

## 2012-03-08 DIAGNOSIS — M25519 Pain in unspecified shoulder: Secondary | ICD-10-CM | POA: Insufficient documentation

## 2012-03-08 DIAGNOSIS — IMO0001 Reserved for inherently not codable concepts without codable children: Secondary | ICD-10-CM | POA: Insufficient documentation

## 2012-03-08 DIAGNOSIS — M25619 Stiffness of unspecified shoulder, not elsewhere classified: Secondary | ICD-10-CM | POA: Insufficient documentation

## 2012-03-13 ENCOUNTER — Ambulatory Visit: Payer: Medicare Other | Admitting: Physical Therapy

## 2012-03-15 ENCOUNTER — Ambulatory Visit: Payer: Medicare Other | Admitting: Physical Therapy

## 2012-03-20 ENCOUNTER — Other Ambulatory Visit: Payer: Self-pay | Admitting: *Deleted

## 2012-03-20 ENCOUNTER — Ambulatory Visit: Payer: Medicare Other | Admitting: Physical Therapy

## 2012-03-20 MED ORDER — HYDROCODONE-ACETAMINOPHEN 5-325 MG PO TABS: 1.0000 | ORAL_TABLET | Freq: Three times a day (TID) | ORAL | Status: DC | PRN

## 2012-03-22 ENCOUNTER — Ambulatory Visit: Payer: Medicare Other

## 2012-03-23 ENCOUNTER — Encounter: Payer: Self-pay | Admitting: Physical Medicine and Rehabilitation

## 2012-03-23 ENCOUNTER — Encounter
Payer: Medicare Other | Attending: Physical Medicine and Rehabilitation | Admitting: Physical Medicine and Rehabilitation

## 2012-03-23 VITALS — HR 93 | Resp 14 | Ht 64.0 in | Wt 278.6 lb

## 2012-03-23 DIAGNOSIS — R071 Chest pain on breathing: Secondary | ICD-10-CM

## 2012-03-23 DIAGNOSIS — Z5181 Encounter for therapeutic drug level monitoring: Secondary | ICD-10-CM

## 2012-03-23 DIAGNOSIS — R0789 Other chest pain: Secondary | ICD-10-CM | POA: Insufficient documentation

## 2012-03-23 DIAGNOSIS — M25519 Pain in unspecified shoulder: Secondary | ICD-10-CM | POA: Insufficient documentation

## 2012-03-23 DIAGNOSIS — G893 Neoplasm related pain (acute) (chronic): Secondary | ICD-10-CM

## 2012-03-23 DIAGNOSIS — M62838 Other muscle spasm: Secondary | ICD-10-CM | POA: Insufficient documentation

## 2012-03-23 MED ORDER — HYDROCODONE-ACETAMINOPHEN 5-325 MG PO TABS: 1.0000 | ORAL_TABLET | Freq: Three times a day (TID) | ORAL | Status: DC | PRN

## 2012-03-23 NOTE — Patient Instructions (Signed)
Continue with exercises and stretching program .

## 2012-03-23 NOTE — Progress Notes (Signed)
Subjective:    Patient ID: Karen Hopkins, female    DOB: 1975/10/19, 36 y.o.   MRN: [id number]  HPI The patient complains about right shoulder pain and right sided chest pain. She has a history of a total mastectomy on the right in September 2011 and subsequently chemotherapy and radiation treatment. Her symptoms started shortly after she received those treatments. The patient is to be in pain and reports that she does not get sufficient relief from her current medications. She states that moving her right arm aggravates the pain and holding her arms still to alleviates the symptoms. Patient reports that the prescribed medication, Mobic, Flexeril and Robaxin made her feel sick, therefore she discontinued these medications. She is still doing PT . She states that she has done some exercises and stretches for her right shoulder on her own.She states, that as a mother of 3 small children she has to be very active, and the pain interferes with her responsibilities, she states, that the Ultram we tried in the past has not given her relief. She reports that the hydrocodone prescribed at the last visit is helping with her functioning.   Pain Inventory Average Pain 7 Pain Right Now 7 My pain is constant, sharp, burning, dull, stabbing, tingling and aching  In the last 24 hours, has pain interfered with the following? General activity 6 Relation with others 6 Enjoyment of life 6 What TIME of day is your pain at its worst? morning daytime and night Sleep (in general) Poor  Pain is worse with: walking, bending, inactivity, standing and some activites Pain improves with: therapy/exercise and medication Relief from Meds: 3  Mobility walk without assistance how many minutes can you walk? 10 ability to climb steps?  yes do you drive?  yes  Function disabled: date disabled   Neuro/Psych numbness tingling spasms depression anxiety  Prior Studies Any changes since last visit?  no  Physicians  involved in your care Any changes since last visit?  no   Family History  Problem Relation Age of Onset  . Hypertension Mother   . Diabetes type II Father   . Prostate cancer Father    History   Social History  . Marital Status: Single    Spouse Name: N/A    Number of Children: N/A  . Years of Education: N/A   Social History Main Topics  . Smoking status: Former Smoker    Types: Cigarettes    Quit date: 04/26/2010  . Smokeless tobacco: Never Used  . Alcohol Use: No  . Drug Use: None  . Sexually Active: None   Other Topics Concern  . None   Social History Narrative  . None   Past Surgical History  Procedure Date  . Mastectomy right sided complete  . Hernia repair 2006  . Nasal septum surgery    Past Medical History  Diagnosis Date  . Breast cancer stage 3    radiation/chemo/ right mastectomy  . Seasonal allergies   . Hypertension    Pulse 93  Resp 14  Ht 5\' 4"  (1.626 m)  Wt 278 lb 9.6 oz (126.372 kg)  BMI 47.82 kg/m2  SpO2 100%   Review of Systems  Musculoskeletal:       Upper back and shoulder pain  Neurological:       Tingling  Psychiatric/Behavioral: Positive for dysphoric mood. The patient is nervous/anxious.   All other systems reviewed and are negative.       Objective:   Physical Exam  Constitutional: She is oriented to [place], place, and time. She appears well-developed.  HENT:  Head: Normocephalic.  Eyes: Pupils are equal, round, and reactive to light.  Neck: Normal range of motion.  Neurological: She is alert and oriented to [place], place, and time.  Skin: Skin is warm and dry.  Psychiatric: She has a normal mood and affect.  Contracture of pectoralis major and minor, scar tissue, external rotation with abduction of right shoulder severely restricted, Abduction is about 90 degrees, external rotation in abduction almost 90 degrees today.  Otherwise strength grossly normal in 4 extremities.        Assessment & Plan:  36 year old  female, status post total mastectomy on the right in September 2011, and subsequentially chemotherapy and radiation therapy, after which she developed right shoulder and right chest pain with contractures of the pectoralis major and minor muscle.  The patient should continue with her current medication.Patient can not switch her Celexa to Cymbalta, because she is on Tamoxifen. She should continue physical therapy with modalities for stretching and loosening the scar tissue in her right chest and shoulder. Prescribed Lyrica for her radiating pain, which she did not tolerate. Prescribed Flexeril for her muscle spasms, which did not help much, patient d/ced Flexeril.  Prescribed Hydrocodone 5mg  #60, to improve her functioning at last visit, Patient was on this medication before, prescribed by her oncologist , she tolerated the medication well, and is now better functioning. She is the mother of 3 and states, that her pain sometimes interferes with her daily responsibilities.  She should continue using the TENS unit, which gives her relief  Patient should follow up in one month.

## 2012-03-27 ENCOUNTER — Ambulatory Visit: Payer: Medicare Other

## 2012-03-29 ENCOUNTER — Ambulatory Visit: Payer: Medicare Other

## 2012-03-29 ENCOUNTER — Telehealth: Payer: Self-pay | Admitting: *Deleted

## 2012-03-29 ENCOUNTER — Other Ambulatory Visit: Payer: Self-pay | Admitting: *Deleted

## 2012-03-29 ENCOUNTER — Encounter: Payer: Self-pay | Admitting: Oncology

## 2012-03-29 DIAGNOSIS — C50919 Malignant neoplasm of unspecified site of unspecified female breast: Secondary | ICD-10-CM

## 2012-03-29 MED ORDER — ALPRAZOLAM ER 1 MG PO TB24: 1.0000 mg | ORAL_TABLET | Freq: Every day | ORAL | Status: DC

## 2012-03-29 NOTE — Telephone Encounter (Signed)
[facility] faxed prior authorization request for Alprazolam XR 1 mg tablets.  Request to Managed Care.

## 2012-03-29 NOTE — Progress Notes (Signed)
[facility], [id number], about alprazolam xr 1mg  30 tabs pa, per rep it has to go to pharmacy review.  Should receive response within 24-72 hours.

## 2012-03-30 ENCOUNTER — Encounter: Payer: Self-pay | Admitting: *Deleted

## 2012-03-30 NOTE — Progress Notes (Signed)
RECEIVED A FAX FROM [facility] CONCERNING A PRIOR AUTHORIZATION FOR ALPRAZOLAM XR. THIS REQUEST WAS PLACED IN THE MANAGED CARE BIN.

## 2012-03-31 ENCOUNTER — Telehealth: Payer: Self-pay | Admitting: *Deleted

## 2012-03-31 ENCOUNTER — Encounter: Payer: Self-pay | Admitting: Oncology

## 2012-03-31 NOTE — Telephone Encounter (Signed)
Pt called with concerns due to onset of "period " . Arieona stated " I am on Tamoxifen and thought I wasn't supposed to have a period ". " is this normal ?"  This RN discussed with Makeshia resumption of menses is normal in a woman of her age while on tamoxifen. This RN did reiterate need for appropriate birth control which Desteni responded " oh yes- Amy has discussed this with me well ".  Per conversation Klover understands to monitor length and discharge of menses and to call if unusual or ongoing beyond 5-7 days.  No other needs at this time.   No results found for this basename: amylase

## 2012-03-31 NOTE — Progress Notes (Signed)
[facility], [id number], approved alprazolam ER 1mg  from 03/30/12-06/06/13.

## 2012-04-04 ENCOUNTER — Ambulatory Visit: Payer: Medicare Other | Admitting: Physical Therapy

## 2012-04-06 ENCOUNTER — Ambulatory Visit: Payer: Medicare Other | Admitting: Physical Therapy

## 2012-04-28 ENCOUNTER — Other Ambulatory Visit: Payer: Self-pay | Admitting: *Deleted

## 2012-04-28 DIAGNOSIS — C50919 Malignant neoplasm of unspecified site of unspecified female breast: Secondary | ICD-10-CM

## 2012-04-28 MED ORDER — ALPRAZOLAM ER 1 MG PO TB24: 1.0000 mg | ORAL_TABLET | Freq: Every day | ORAL | Status: DC

## 2012-05-12 ENCOUNTER — Encounter: Payer: Self-pay | Admitting: Oncology

## 2012-05-12 NOTE — Progress Notes (Signed)
Patient called and said she needs a new application. She has medicare and medicaid. I sent her with address to send back to.

## 2012-05-17 ENCOUNTER — Encounter
Payer: Medicare Other | Attending: Physical Medicine and Rehabilitation | Admitting: Physical Medicine and Rehabilitation

## 2012-05-17 ENCOUNTER — Ambulatory Visit: Payer: Medicare Other | Attending: Oncology | Admitting: Physical Therapy

## 2012-05-17 ENCOUNTER — Encounter: Payer: Self-pay | Admitting: Physical Medicine and Rehabilitation

## 2012-05-17 VITALS — BP 115/61 | HR 83 | Resp 14 | Ht 64.0 in | Wt 279.8 lb

## 2012-05-17 DIAGNOSIS — Z9010 Acquired absence of unspecified breast and nipple: Secondary | ICD-10-CM | POA: Insufficient documentation

## 2012-05-17 DIAGNOSIS — IMO0001 Reserved for inherently not codable concepts without codable children: Secondary | ICD-10-CM

## 2012-05-17 DIAGNOSIS — R071 Chest pain on breathing: Secondary | ICD-10-CM

## 2012-05-17 DIAGNOSIS — R0789 Other chest pain: Secondary | ICD-10-CM

## 2012-05-17 DIAGNOSIS — M62838 Other muscle spasm: Secondary | ICD-10-CM | POA: Insufficient documentation

## 2012-05-17 DIAGNOSIS — M25519 Pain in unspecified shoulder: Secondary | ICD-10-CM | POA: Insufficient documentation

## 2012-05-17 DIAGNOSIS — G893 Neoplasm related pain (acute) (chronic): Secondary | ICD-10-CM

## 2012-05-17 DIAGNOSIS — M25619 Stiffness of unspecified shoulder, not elsewhere classified: Secondary | ICD-10-CM | POA: Insufficient documentation

## 2012-05-17 MED ORDER — HYDROCODONE-ACETAMINOPHEN 5-325 MG PO TABS: 1.0000 | ORAL_TABLET | Freq: Three times a day (TID) | ORAL | Status: DC | PRN

## 2012-05-17 NOTE — Patient Instructions (Signed)
Continue with your exercises you learned at PT ! You could try Arnica cream for your muscle pain.

## 2012-05-17 NOTE — Progress Notes (Signed)
Subjective:    Patient ID: Karen Hopkins, female    DOB: 07/04/1975, 36 y.o.   MRN: [id number]  HPI The patient complains about right shoulder pain and right sided chest pain. She has a history of a total mastectomy on the right in September 2011 and subsequently chemotherapy and radiation treatment. Her symptoms started shortly after she received those treatments. The patient is to be in pain and reports that she does not get sufficient relief from her current medications. She states that moving her right arm aggravates the pain and holding her arms still to alleviates the symptoms. Patient reports that the prescribed medication, Mobic, Flexeril and Robaxin made her feel sick, therefore she discontinued these medications. She has finished PT . She states that she has done some exercises and stretches for her right shoulder on her own.She states, that as a mother of 3 small children she has to be very active, and the pain interferes with her responsibilities, she states, that the Ultram we tried in the past has not given her relief. She reports that the hydrocodone prescribed at the last visit is helping with her functioning.   Pain Inventory Average Pain 8 Pain Right Now 8 My pain is sharp, dull, stabbing, tingling and aching  In the last 24 hours, has pain interfered with the following? General activity 6 Relation with others 7 Enjoyment of life 8 What TIME of day is your pain at its worst? all of the time Sleep (in general) Poor  Pain is worse with: walking, bending, sitting, inactivity, standing and some activites Pain improves with: rest, medication and TENS Relief from Meds: 5  Mobility walk without assistance how many minutes can you walk? 10 ability to climb steps?  yes do you drive?  yes  Function disabled: date disabled   Neuro/Psych numbness tremor tingling trouble walking spasms confusion depression anxiety  Prior Studies Any changes since last visit?   no  Physicians involved in your care Any changes since last visit?  no   Family History  Problem Relation Age of Onset  . Hypertension Mother   . Diabetes type II Father   . Prostate cancer Father    History   Social History  . Marital Status: Single    Spouse Name: N/A    Number of Children: N/A  . Years of Education: N/A   Social History Main Topics  . Smoking status: Former Smoker    Types: Cigarettes    Quit date: 04/26/2010  . Smokeless tobacco: Never Used  . Alcohol Use: No  . Drug Use: None  . Sexually Active: None   Other Topics Concern  . None   Social History Narrative  . None   Past Surgical History  Procedure Date  . Mastectomy right sided complete  . Hernia repair 2006  . Nasal septum surgery    Past Medical History  Diagnosis Date  . Breast cancer stage 3    radiation/chemo/ right mastectomy  . Seasonal allergies   . Hypertension    BP 115/61  Pulse 83  Resp 14  Ht 5\' 4"  (1.626 m)  Wt 279 lb 12.8 oz (126.916 kg)  BMI 48.03 kg/m2  SpO2 99%    Review of Systems  Constitutional: Positive for unexpected weight change.  Musculoskeletal:       Arm pain  Neurological: Positive for tremors and numbness.       Tingling  Psychiatric/Behavioral: Positive for confusion and dysphoric mood. The patient is nervous/anxious.  All other systems reviewed and are negative.       Objective:   Physical Exam Constitutional: She is oriented to [place], place, and time. She appears well-developed.  HENT:  Head: Normocephalic.  Eyes: Pupils are equal, round, and reactive to light.  Neck: Normal range of motion.  Neurological: She is alert and oriented to [place], place, and time.  Skin: Skin is warm and dry.  Psychiatric: She has a normal mood and affect.  Contracture of pectoralis major and minor, scar tissue, external rotation with abduction of right shoulder severely restricted, Abduction , external rotation in abduction not measured today,  patient just got her right arm wrapped by OT. Otherwise strength grossly normal in 4 extremities.        Assessment & Plan:  36 year old female, status post total mastectomy on the right in September 2011, and subsequentially chemotherapy and radiation therapy, after which she developed right shoulder and right chest pain with contractures of the pectoralis major and minor muscle.  The patient should continue with her current medication.Patient can not switch her Celexa to Cymbalta, because she is on Tamoxifen. She should continue with the exercises she learned at physical therapy . Prescribed Lyrica for her radiating pain, which she did not tolerate. Prescribed Flexeril for her muscle spasms, which did not help much, patient d/ced Flexeril.  Refilled Hydrocodone 5mg  #60, to improve her functioning, Patient was on this medication before, prescribed by her oncologist , she tolerated the medication well, and is now better functioning. She is the mother of 3 and states, that her pain sometimes interferes with her daily responsibilities.  She should continue using the TENS unit, which gives her relief . Filled out a handicaped sticker form today. Patient should follow up in one month.

## 2012-05-18 ENCOUNTER — Other Ambulatory Visit: Payer: Medicaid Other | Admitting: Lab

## 2012-05-18 ENCOUNTER — Ambulatory Visit: Payer: Medicare Other

## 2012-05-22 ENCOUNTER — Ambulatory Visit: Payer: Medicare Other | Admitting: Physical Therapy

## 2012-05-22 ENCOUNTER — Ambulatory Visit: Payer: Medicare Other

## 2012-05-22 ENCOUNTER — Ambulatory Visit: Payer: Medicare Other | Admitting: Physical Medicine and Rehabilitation

## 2012-05-24 ENCOUNTER — Ambulatory Visit: Payer: Medicare Other

## 2012-05-25 ENCOUNTER — Ambulatory Visit: Payer: Medicare Other

## 2012-05-25 ENCOUNTER — Ambulatory Visit: Payer: Medicaid Other | Admitting: Oncology

## 2012-05-25 ENCOUNTER — Other Ambulatory Visit (HOSPITAL_BASED_OUTPATIENT_CLINIC_OR_DEPARTMENT_OTHER): Payer: Medicare Other | Admitting: Lab

## 2012-05-25 DIAGNOSIS — C50919 Malignant neoplasm of unspecified site of unspecified female breast: Secondary | ICD-10-CM

## 2012-05-25 DIAGNOSIS — C50419 Malignant neoplasm of upper-outer quadrant of unspecified female breast: Secondary | ICD-10-CM

## 2012-05-25 LAB — CBC WITH DIFFERENTIAL/PLATELET
BASO%: 0.3 % (ref 0.0–2.0)
Basophils Absolute: 0 10*3/uL (ref 0.0–0.1)
EOS%: 5.7 % (ref 0.0–7.0)
HCT: 39.1 % (ref 34.8–46.6)
HGB: 12.6 g/dL (ref 11.6–15.9)
LYMPH%: 29.7 % (ref 14.0–49.7)
MCH: 28.6 pg (ref 25.1–34.0)
MCHC: 32.3 g/dL (ref 31.5–36.0)
MCV: 88.5 fL (ref 79.5–101.0)
NEUT%: 58.7 % (ref 38.4–76.8)
Platelets: 250 10*3/uL (ref 145–400)

## 2012-05-25 LAB — COMPREHENSIVE METABOLIC PANEL (CC13)
ALT: 16 U/L (ref 0–55)
AST: 14 U/L (ref 5–34)
BUN: 11 mg/dL (ref 7.0–26.0)
Creatinine: 0.7 mg/dL (ref 0.6–1.1)
Total Bilirubin: 0.22 mg/dL (ref 0.20–1.20)

## 2012-05-29 ENCOUNTER — Telehealth: Payer: Self-pay | Admitting: *Deleted

## 2012-05-29 ENCOUNTER — Ambulatory Visit (HOSPITAL_BASED_OUTPATIENT_CLINIC_OR_DEPARTMENT_OTHER): Payer: Medicare Other | Admitting: Oncology

## 2012-05-29 ENCOUNTER — Ambulatory Visit: Payer: Medicare Other

## 2012-05-29 VITALS — BP 122/87 | HR 108 | Temp 98.2°F | Resp 20 | Ht 64.0 in | Wt 272.2 lb

## 2012-05-29 DIAGNOSIS — C50919 Malignant neoplasm of unspecified site of unspecified female breast: Secondary | ICD-10-CM

## 2012-05-29 DIAGNOSIS — C50419 Malignant neoplasm of upper-outer quadrant of unspecified female breast: Secondary | ICD-10-CM

## 2012-05-29 DIAGNOSIS — G8929 Other chronic pain: Secondary | ICD-10-CM

## 2012-05-29 DIAGNOSIS — Z170 Estrogen receptor positive status [ER+]: Secondary | ICD-10-CM

## 2012-05-29 DIAGNOSIS — I890 Lymphedema, not elsewhere classified: Secondary | ICD-10-CM

## 2012-05-29 MED ORDER — AZITHROMYCIN 500 MG PO TABS: 500.0000 mg | ORAL_TABLET | Freq: Every day | ORAL | Status: DC

## 2012-05-29 MED ORDER — ALPRAZOLAM ER 1 MG PO TB24: 1.0000 mg | ORAL_TABLET | Freq: Every day | ORAL | Status: DC

## 2012-05-29 NOTE — Progress Notes (Signed)
ID: Karen Hopkins   DOB: [birth year]  MR#: [id number]  UYQ#:[id number]  PCP: MATTHEWS,CODY, DO GYN: SU: Cicero Duck OTHER MD: Claudette Laws  HISTORY OF PRESENT ILLNESS: The patient noted swelling and redness, as well as some tenderness, over her right breast for some time. She thought this might be related to pregnancy, since she delivered her third child about 8 months ago. As the problem did not improve, she brought it to her primary care physician's attention. He treated her with Bactrim, which she says she could not tolerate because of nausea and vomiting, but which in any case did not resolve the problem, so he set her up for mammography at [facility] 08/26/2009. Dr. Deboraha Sprang found by exam marked skin thickening and erythema of the right breast extending pretty much throughout the breast. By mammography there were scattered fibroglandular densities, and an asymmetric density in the right upper outer quadrant with suspicious microcalcifications. There were also enlarged right axillary lymph nodes. The left appeared normal. By ultrasound there was an ill defined area of hypoechoic tissue in the right upper outer quadrant that was difficult to measure. There were also abnormal right axillary lymph nodes.  Ultrasound guided biopsy was performed the same day, and showed [phone]) a poorly differentiated invasive ductal carcinoma, which was estrogen receptor poor at 3%, progesterone receptor positive at 14% with an elevated proliferation marker at 50%, but importantly with strong amplification of HER-2 by CISH with a ratio of 5.20.  Bilateral breast MRIs were obtained 09/02/2009. This showed an area up to 10.6 cm in the right breast showing confluent mass and non-mass enhancement. There was marked skin thickening involving the entire right breast, and numerous bulky right axillary lymph nodes were identified. The left breast was unremarkable, and there was no evidence of internal mammary lymph  node involvement.  Patient was treated in the neoadjuvant setting with 4 dose dense cycles of doxorubicin and cyclophosphamide, followed by 12 weekly doses of paclitaxel and trastuzumab. Karen Hopkins underwent definitive right modified radical mastectomy in October 2011, with a residual 2.5 mm area of tumor in the breast, grade 3, and one of 21 lymph nodes involved. Her subsequent history is as detailed below  INTERVAL HISTORY: Karen Hopkins returns today for followup of her right breast carcinoma. She is tolerating the tamoxifen moderately well, except for problems with hot flashes. She wonders if the tamoxifen is causing her to gain weight or to have aches and pains. She is going to the lymphedema clinic 2 or 3 times a week to get her right upper extremity wrapped. Once the lymphedema becomes more manageable she will get a new sleeve.  REVIEW OF SYSTEMS: She has pain in her chest, knees, shoulders, and right arm. She is receiving hydrocodone for this, which constipates her. She sleeps poorly. She severely fatigued. She has a runny nose, sinus problems, and severe sinus headaches at present. She had to productive cough, but no fever. She is having chills. She feels short of breath when walking up stairs or with almost any significant activity, [facility] and [facility] in the [place] program, which she enjoyed. She continues to have urinary stress incontinence symptoms. She feels forgetful, anxious, and depressed. Otherwise a detailed review of systems today was stable.  PAST MEDICAL HISTORY: Past Medical History  Diagnosis Date  . Breast cancer stage 3    radiation/chemo/ right mastectomy  . Seasonal allergies   . Hypertension     PAST SURGICAL HISTORY:  Past Surgical History  Procedure Date  . Mastectomy right sided complete  . Hernia repair 2006  . Nasal septum surgery     FAMILY HISTORY Family History  Problem Relation Age of Onset  . Hypertension Mother   . Diabetes  type II Father   . Prostate cancer Father   There is no history of breast or ovarian cancer in the immediate family, but of the patient's mother's mother's six sisters, two (the patient's great-aunts) had ovarian cancer.  GYNECOLOGIC HISTORY: The patient is GX, P3. First child was premature. Age at first delivery, 36 years old.   SOCIAL HISTORY: (Updated December 2013) She is currently disabled. Ainslie's children are Karen Hopkins, and Karen Hopkins, aged 52, 8, and 3.. They are all boys, all at home. The patient attends a non-denominational church in [place], which she considers her home, but she is currently living in [place].   ADVANCED DIRECTIVES:  HEALTH MAINTENANCE: History  Substance Use Topics  . Smoking status: Former Smoker    Types: Cigarettes    Quit date: 04/26/2010  . Smokeless tobacco: Never Used  . Alcohol Use: No     Colonoscopy: Never  PAP: 2012  Bone density: Never  Lipid panel:  Allergies  Allergen Reactions  . Lyrica (Pregabalin)   . Meloxicam Nausea Only  . Penicillins     REACTION: Facial swelling  . Robaxin (Methocarbamol) Nausea Only    Current Outpatient Prescriptions  Medication Sig Dispense Refill  . albuterol (PROVENTIL HFA;VENTOLIN HFA) 108 (90 BASE) MCG/ACT inhaler Inhale 2 puffs into the lungs every 6 (six) hours as needed for wheezing.  1 Inhaler  1  . ALPRAZolam (XANAX XR) 1 MG 24 hr tablet Take 1 tablet (1 mg total) by mouth daily.  30 tablet  0  . azithromycin (ZITHROMAX) 500 MG tablet Take 1 tablet (500 mg total) by mouth daily. For 5 days  5 tablet  0  . benzonatate (TESSALON) 200 MG capsule Take 1 capsule (200 mg total) by mouth 2 (two) times daily as needed for cough.  30 capsule  0  . citalopram (CELEXA) 20 MG tablet Take 1 tablet (20 mg total) by mouth daily.  30 tablet  5  . diclofenac sodium (VOLTAREN) 1 % GEL Apply 2 g topically 4 (four) times daily.  2 Tube  2  . HYDROcodone-acetaminophen (NORCO) 5-325 MG per tablet Take 1  tablet by mouth every 8 (eight) hours as needed for pain.  60 tablet  1  . naproxen (NAPROSYN) 500 MG tablet       . tamoxifen (NOLVADEX) 20 MG tablet Take 1 tablet (20 mg total) by mouth daily.  30 tablet  5  . traMADol (ULTRAM) 50 MG tablet Take 1 tablet (50 mg total) by mouth every 6 (six) hours as needed.  120 tablet  3  . triamterene-hydrochlorothiazide (MAXZIDE-25) 37.5-25 MG per tablet Take 1 each (1 tablet total) by mouth daily.  30 tablet  5    OBJECTIVE: Young African American woman in moderate distress Filed Vitals:   05/29/12 1030  BP: 122/87  Pulse: 108  Temp: 98.2 F (36.8 C)  Resp: 20     Body mass index is 46.72 kg/(m^2).    ECOG FS: 2  Filed Weights   05/29/12 1030  Weight: 272 lb 3.2 oz (123.469 kg)    Sclerae unicteric Oropharynx clear No cervical or supraclavicular adenopathy Lungs no rales or rhonchi Heart regular rate and rhythm Abd obese, benign MSK no focal spinal tenderness,  2+ edema right upper extremity; no pain to palpation in the right chest wall or right axillary area. Neuro: nonfocal Breasts: The right breast is status post mastectomy. Exam is reassuring and I do not see any evidence of local recurrence. The right axilla is clear. The left breast is unremarkable.     LAB RESULTS: Lab Results  Component Value Date   WBC 8.4 05/25/2012   NEUTROABS 5.0 05/25/2012   HGB 12.6 05/25/2012   HCT 39.1 05/25/2012   MCV 88.5 05/25/2012   PLT 250 05/25/2012      Chemistry      Component Value Date/Time   NA 139 05/25/2012 1016   NA 138 11/24/2011 0903   K 3.5 05/25/2012 1016   K 3.2* 11/24/2011 0903   CL 105 05/25/2012 1016   CL 103 11/24/2011 0903   CO2 27 05/25/2012 1016   CO2 27 11/24/2011 0903   BUN 11.0 05/25/2012 1016   BUN 13 11/24/2011 0903   CREATININE 0.7 05/25/2012 1016   CREATININE 0.58 11/24/2011 0903      Component Value Date/Time   CALCIUM 9.8 05/25/2012 1016   CALCIUM 9.1 11/24/2011 0903   ALKPHOS 69 05/25/2012 1016    ALKPHOS 65 11/24/2011 0903   AST 14 05/25/2012 1016   AST 13 11/24/2011 0903   ALT 16 05/25/2012 1016   ALT 13 11/24/2011 0903   BILITOT 0.22 05/25/2012 1016   BILITOT 0.1* 11/24/2011 0903       Lab Results  Component Value Date   LABCA2 22 05/25/2012     STUDIES: No results found. Left mammogram May 2013 was benign   ASSESSMENT: 36 year old BRCA1 and BRCA2 negative [place] woman, status post right breast biopsy in March 2011 for a high-grade invasive ductal carcinoma, ER positive 3%, PR positive 14%, strongly HER2/neu positive with MIB-1 of 15%. Biopsy proven lymph node involvement at presentation. Changes consistent with inflammatory carcinoma.   1. Treated neoadjuvantly, status post 4 dose-dense cycles of doxorubicin/cyclophosphamide, followed by 12 weekly doses of paclitaxel with trastuzumab completed September 2011.   2. Trastuzumab was then continued for a total of 1 year [to June 2012]. Post-treatment echo showed a well-preserved ejection fraction.   3. Status post right modified radical mastectomy in October 2011 showing a ypT1a ypN1 invasive ductal carcinoma, grade 3.   4. postmastectomy radiation completed in January 2012  5. on tamoxifen beginning January of 2012  6. other problems include chronic postsurgical pain and chronic right upper extremity lymphedema  PLAN: Maleeyah is now 2 years out from her definitive surgery with no evidence of disease recurrence. She continues to have chronic pain and chronic lymphedema. She is being followed through the lymphedema clinic and the pain clinic for the those issues. Today I refilled her tamoxifen and she also requested a refill on her Xanax, which I wrote for her. I suggested she take MiraLAX for constipation.  She would like to lose weight, and [facility]. I in [place] to continue and also to continue to go to the lymphedema clinic on total her right upper extremity swelling is  better controlled. Otherwise she will return to see [place] again in 4 months. She knows to call for any problems that may develop before that visit.   Yissel Habermehl C    05/29/2012

## 2012-05-29 NOTE — Telephone Encounter (Signed)
Gave patient appointment for 09-2012 

## 2012-06-05 ENCOUNTER — Ambulatory Visit: Payer: Medicare Other | Admitting: Physical Therapy

## 2012-06-08 ENCOUNTER — Ambulatory Visit: Payer: Medicare Other | Attending: Oncology | Admitting: Physical Therapy

## 2012-06-08 DIAGNOSIS — IMO0001 Reserved for inherently not codable concepts without codable children: Secondary | ICD-10-CM | POA: Insufficient documentation

## 2012-06-08 DIAGNOSIS — M25519 Pain in unspecified shoulder: Secondary | ICD-10-CM | POA: Insufficient documentation

## 2012-06-08 DIAGNOSIS — M25619 Stiffness of unspecified shoulder, not elsewhere classified: Secondary | ICD-10-CM | POA: Insufficient documentation

## 2012-06-12 ENCOUNTER — Ambulatory Visit: Payer: Medicare Other

## 2012-06-14 ENCOUNTER — Ambulatory Visit: Payer: Medicare Other

## 2012-06-19 ENCOUNTER — Ambulatory Visit: Payer: Medicare Other

## 2012-06-21 ENCOUNTER — Ambulatory Visit: Payer: Medicare Other

## 2012-06-22 ENCOUNTER — Other Ambulatory Visit: Payer: Self-pay | Admitting: *Deleted

## 2012-06-22 DIAGNOSIS — C50919 Malignant neoplasm of unspecified site of unspecified female breast: Secondary | ICD-10-CM

## 2012-06-22 MED ORDER — TRAMADOL HCL 50 MG PO TABS: 50.0000 mg | ORAL_TABLET | Freq: Four times a day (QID) | ORAL | Status: DC | PRN

## 2012-06-26 ENCOUNTER — Ambulatory Visit: Payer: Medicare Other | Admitting: *Deleted

## 2012-06-26 ENCOUNTER — Telehealth: Payer: Self-pay | Admitting: *Deleted

## 2012-06-26 NOTE — Telephone Encounter (Signed)
Message left by pt requesting a return call due to onset of menstrual bleeding " I forgot if you told me this is normal ". Per message Aida stated onset occurred on 1/17 and described it as " heavy".  Pt left cell number for contact of [phone].  This RN returned call and obtained identified VM. Detailed message left stating above is normal- reason for heaviness and need to be aware of potential for pregnancy if she had unprotected sex.  This RN did request for return call as well to discuss her concerns further including what to do if bleeding does not stop or lighten up.

## 2012-06-27 ENCOUNTER — Encounter: Payer: Self-pay | Admitting: Oncology

## 2012-06-27 NOTE — Progress Notes (Signed)
Pt is approved for 100% financial assistance effective 06/27/12 - 12/25/12.  I will mail approval letter and green card today.

## 2012-06-28 ENCOUNTER — Ambulatory Visit: Payer: Medicare Other | Admitting: Physical Therapy

## 2012-06-29 ENCOUNTER — Ambulatory Visit: Payer: Medicare Other

## 2012-06-29 ENCOUNTER — Other Ambulatory Visit: Payer: Self-pay | Admitting: *Deleted

## 2012-06-29 DIAGNOSIS — C50919 Malignant neoplasm of unspecified site of unspecified female breast: Secondary | ICD-10-CM

## 2012-06-29 MED ORDER — ALPRAZOLAM ER 1 MG PO TB24: 1.0000 mg | ORAL_TABLET | Freq: Every day | ORAL | Status: DC

## 2012-07-03 ENCOUNTER — Ambulatory Visit: Payer: Medicare Other

## 2012-07-05 ENCOUNTER — Ambulatory Visit: Payer: Medicare Other | Admitting: Physical Therapy

## 2012-07-06 ENCOUNTER — Ambulatory Visit: Payer: Medicare Other | Admitting: Physical Therapy

## 2012-07-06 ENCOUNTER — Ambulatory Visit: Payer: Medicare Other

## 2012-07-10 ENCOUNTER — Ambulatory Visit: Payer: Medicare Other | Attending: Oncology

## 2012-07-10 DIAGNOSIS — M25619 Stiffness of unspecified shoulder, not elsewhere classified: Secondary | ICD-10-CM | POA: Insufficient documentation

## 2012-07-10 DIAGNOSIS — M25519 Pain in unspecified shoulder: Secondary | ICD-10-CM | POA: Insufficient documentation

## 2012-07-10 DIAGNOSIS — IMO0001 Reserved for inherently not codable concepts without codable children: Secondary | ICD-10-CM | POA: Insufficient documentation

## 2012-07-12 ENCOUNTER — Ambulatory Visit: Payer: Medicare Other

## 2012-07-14 ENCOUNTER — Telehealth: Payer: Self-pay | Admitting: *Deleted

## 2012-07-14 ENCOUNTER — Ambulatory Visit: Payer: Medicare Other | Admitting: Physical Therapy

## 2012-07-14 NOTE — Telephone Encounter (Signed)
This RN spoke with pt per call from the lymphedema clinic stating medical concerns occurring while obtaining therapy today.  Per [name]- pt became " trembly and weak -" pt informed [name] she had taken all her am meds without food.  Presently rehab gave pt crackers and water.  This RN spoke with pt per above- she states she is currently " feeling better " and will leave and get something more substantial to eat.  This RN will follow up with pt later today for status- pt did state when asked if other concerns- " I think I need a change in my antidepressant pills- I don't feel like they are working ".  This RN informed pt above would be reviewed with AB/PA and call returned to her later today. [name] verbalized appreciation.

## 2012-07-17 ENCOUNTER — Ambulatory Visit: Payer: Medicare Other

## 2012-07-18 ENCOUNTER — Encounter: Payer: Self-pay | Admitting: Physician Assistant

## 2012-07-18 ENCOUNTER — Telehealth: Payer: Self-pay | Admitting: Oncology

## 2012-07-18 ENCOUNTER — Ambulatory Visit (HOSPITAL_BASED_OUTPATIENT_CLINIC_OR_DEPARTMENT_OTHER): Payer: Medicare Other | Admitting: Physician Assistant

## 2012-07-18 VITALS — BP 93/58 | HR 93 | Temp 97.5°F | Resp 20 | Ht 64.0 in | Wt 280.6 lb

## 2012-07-18 DIAGNOSIS — R51 Headache: Secondary | ICD-10-CM

## 2012-07-18 DIAGNOSIS — C50919 Malignant neoplasm of unspecified site of unspecified female breast: Secondary | ICD-10-CM

## 2012-07-18 DIAGNOSIS — C773 Secondary and unspecified malignant neoplasm of axilla and upper limb lymph nodes: Secondary | ICD-10-CM

## 2012-07-18 DIAGNOSIS — C50419 Malignant neoplasm of upper-outer quadrant of unspecified female breast: Secondary | ICD-10-CM

## 2012-07-18 DIAGNOSIS — F329 Major depressive disorder, single episode, unspecified: Secondary | ICD-10-CM

## 2012-07-18 DIAGNOSIS — F341 Dysthymic disorder: Secondary | ICD-10-CM

## 2012-07-18 NOTE — Progress Notes (Signed)
ID: Karen Hopkins   DOB: July 06, 1975  MR#: [id number]  WUJ#:[id number]  PCP: MATTHEWS,CODY, DO GYN: SU: Cicero Duck OTHER MD: Claudette Laws  HISTORY OF PRESENT ILLNESS: The patient noted swelling and redness, as well as some tenderness, over her right breast for some time. She thought this might be related to pregnancy, since she delivered her third child about 8 months ago. As the problem did not improve, she brought it to her primary care physician's attention. He treated her with Bactrim, which she says she could not tolerate because of nausea and vomiting, but which in any case did not resolve the problem, so he set her up for mammography at [facility] 08/26/2009. Dr. Deboraha Sprang found by exam marked skin thickening and erythema of the right breast extending pretty much throughout the breast. By mammography there were scattered fibroglandular densities, and an asymmetric density in the right upper outer quadrant with suspicious microcalcifications. There were also enlarged right axillary lymph nodes. The left appeared normal. By ultrasound there was an ill defined area of hypoechoic tissue in the right upper outer quadrant that was difficult to measure. There were also abnormal right axillary lymph nodes.  Ultrasound guided biopsy was performed the same day, and showed [phone]) a poorly differentiated invasive ductal carcinoma, which was estrogen receptor poor at 3%, progesterone receptor positive at 14% with an elevated proliferation marker at 50%, but importantly with strong amplification of HER-2 by CISH with a ratio of 5.20.  Bilateral breast MRIs were obtained 09/02/2009. This showed an area up to 10.6 cm in the right breast showing confluent mass and non-mass enhancement. There was marked skin thickening involving the entire right breast, and numerous bulky right axillary lymph nodes were identified. The left breast was unremarkable, and there was no evidence of internal mammary lymph  node involvement.  Patient was treated in the neoadjuvant setting with 4 dose dense cycles of doxorubicin and cyclophosphamide, followed by 12 weekly doses of paclitaxel and trastuzumab. Karen Hopkins underwent definitive right modified radical mastectomy in October 2011, with a residual 2.5 mm area of tumor in the breast, grade 3, and one of 21 lymph nodes involved. Her subsequent history is as detailed below  INTERVAL HISTORY: Karen Hopkins returns today in between scheduled followup visits to discuss several issues she has been having. Of course she is followed to her office for her history of right breast cancer, and continues on tamoxifen with good tolerance. She has chronic pain and chronic lymphedema, and continues to be seen at the lymphedema clinic several times weekly.  Most recently, Karen Hopkins has felt increasingly depressed and anxious. She denies suicidal ideation. She continues on Celexa 20 mg daily and Xanax XR 1 milligram daily but "doesn't think they're helping". She's also had frequent headaches associated with dizziness and blurred vision along with increased weakness over the past one to 2 weeks. The headaches often "put her to bed" which isn't new for her. She denies any significant nausea or emesis. She's had no gait disturbances, seizure activity, or loss of consciousness.   REVIEW OF SYSTEMS: Karen Hopkins has had no fevers or chills. She continues to have occasional hot flashes. As noted above, she also has chronic pain   diffusely throughout the body, but especially affecting her joints, her right chest wall, and her right arm. She has a difficult time sleeping. She feels tired. She's had no change in bowel or bladder habits. She denies any cough or phlegm production but does have shortness of breath with even  minor exertion. She's had no chest pain or palpitations.  A detailed review of systems is otherwise stable and noncontributory.   PAST MEDICAL HISTORY: Past Medical History  Diagnosis Date   . Breast cancer stage 3    radiation/chemo/ right mastectomy  . Seasonal allergies   . Hypertension     PAST SURGICAL HISTORY: Past Surgical History  Procedure Laterality Date  . Mastectomy  right sided complete  . Hernia repair  2006  . Nasal septum surgery      FAMILY HISTORY Family History  Problem Relation Age of Onset  . Hypertension Mother   . Diabetes type II Father   . Prostate cancer Father   There is no history of breast or ovarian cancer in the immediate family, but of the patient's mother's mother's six sisters, two (the patient's great-aunts) had ovarian cancer.  GYNECOLOGIC HISTORY: The patient is GX, P3. First child was premature. Age at first delivery, 37 years old.   SOCIAL HISTORY: (Updated December 2013) She is currently disabled. Karen Hopkins's children are Karen Hopkins, and Karen Hopkins, aged 41, 8, and 3.. They are all boys, all at home. The patient attends a non-denominational church in [place], which she considers her home, but she is currently living in [place].   ADVANCED DIRECTIVES:  HEALTH MAINTENANCE: History  Substance Use Topics  . Smoking status: Former Smoker    Types: Cigarettes    Quit date: 04/26/2010  . Smokeless tobacco: Never Used  . Alcohol Use: No     Colonoscopy: Never  PAP: 2012  Bone density: Never  Lipid panel:  Allergies  Allergen Reactions  . Lyrica (Pregabalin)   . Meloxicam Nausea Only  . Penicillins     REACTION: Facial swelling  . Robaxin (Methocarbamol) Nausea Only    Current Outpatient Prescriptions  Medication Sig Dispense Refill  . albuterol (PROVENTIL HFA;VENTOLIN HFA) 108 (90 BASE) MCG/ACT inhaler Inhale 2 puffs into the lungs every 6 (six) hours as needed for wheezing.  1 Inhaler  1  . ALPRAZolam (XANAX XR) 1 MG 24 hr tablet Take 1 tablet (1 mg total) by mouth daily.  30 tablet  0  . citalopram (CELEXA) 20 MG tablet Take 1 tablet (20 mg total) by mouth daily.  30 tablet  5  . diclofenac sodium  (VOLTAREN) 1 % GEL Apply 2 g topically 4 (four) times daily.  2 Tube  2  . HYDROcodone-acetaminophen (NORCO) 5-325 MG per tablet Take 1 tablet by mouth every 8 (eight) hours as needed for pain.  60 tablet  1  . naproxen (NAPROSYN) 500 MG tablet       . tamoxifen (NOLVADEX) 20 MG tablet Take 1 tablet (20 mg total) by mouth daily.  30 tablet  5  . traMADol (ULTRAM) 50 MG tablet Take 1 tablet (50 mg total) by mouth every 6 (six) hours as needed.  120 tablet  3  . triamterene-hydrochlorothiazide (MAXZIDE-25) 37.5-25 MG per tablet Take 1 tablet by mouth daily.      . benzonatate (TESSALON) 200 MG capsule Take 1 capsule (200 mg total) by mouth 2 (two) times daily as needed for cough.  30 capsule  0   No current facility-administered medications for this visit.    OBJECTIVE: Young [place] American woman who appears tired, anxious, and depressed Filed Vitals:   07/18/12 1146  BP: 93/58  Pulse: 93  Temp: 97.5 F (36.4 C)  Resp: 20     Body mass index is 48.14 kg/(m^2).    ECOG FS:  2  Filed Weights   07/18/12 1146  Weight: 280 lb 9.6 oz (127.279 kg)    Sclerae unicteric Oropharynx clear No cervical or supraclavicular adenopathy Lungs no rales or rhonchi Heart regular rate and rhythm Abdomen obese, soft, nontender to palpation, positive bowel sounds MSK no focal spinal tenderness,  2+ edema right upper extremity with a compression wrap intact Neuro: nonfocal, well oriented with anxious affect Breasts: The right breast is status post mastectomy. No evidence of local recurrence. The right axilla is clear.     LAB RESULTS: Lab Results  Component Value Date   WBC 8.4 05/25/2012   NEUTROABS 5.0 05/25/2012   HGB 12.6 05/25/2012   HCT 39.1 05/25/2012   MCV 88.5 05/25/2012   PLT 250 05/25/2012      Chemistry      Component Value Date/Time   NA 139 05/25/2012 1016   NA 138 11/24/2011 0903   K 3.5 05/25/2012 1016   K 3.2* 11/24/2011 0903   CL 105 05/25/2012 1016   CL 103 11/24/2011  0903   CO2 27 05/25/2012 1016   CO2 27 11/24/2011 0903   BUN 11.0 05/25/2012 1016   BUN 13 11/24/2011 0903   CREATININE 0.7 05/25/2012 1016   CREATININE 0.58 11/24/2011 0903      Component Value Date/Time   CALCIUM 9.8 05/25/2012 1016   CALCIUM 9.1 11/24/2011 0903   ALKPHOS 69 05/25/2012 1016   ALKPHOS 65 11/24/2011 0903   AST 14 05/25/2012 1016   AST 13 11/24/2011 0903   ALT 16 05/25/2012 1016   ALT 13 11/24/2011 0903   BILITOT 0.22 05/25/2012 1016   BILITOT 0.1* 11/24/2011 0903       Lab Results  Component Value Date   LABCA2 22 05/25/2012     STUDIES: Left mammogram May 2013 was benign   ASSESSMENT: 37 year old BRCA1 and BRCA2 negative [place] woman, status post right breast biopsy in March 2011 for a high-grade invasive ductal carcinoma, ER positive 3%, PR positive 14%, strongly HER2/neu positive with MIB-1 of 15%. Biopsy proven lymph node involvement at presentation. Changes consistent with inflammatory carcinoma.   1. Treated neoadjuvantly, status post 4 dose-dense cycles of doxorubicin/cyclophosphamide, followed by 12 weekly doses of paclitaxel with trastuzumab completed September 2011.   2. Trastuzumab was then continued for a total of 1 year [to June 2012]. Post-treatment echo showed a well-preserved ejection fraction.   3. Status post right modified radical mastectomy in October 2011 showing a ypT1a ypN1 invasive ductal carcinoma, grade 3.   4. postmastectomy radiation completed in January 2012  5. on tamoxifen beginning January of 2012  6. other problems include chronic postsurgical pain and chronic right upper extremity lymphedema  PLAN: I think it would be reasonable for Franchon to be evaluated by psychiatry for optimal treatment of her depression and anxiety. We've tried several low-dose antidepressants in addition to her Xanax, and she is simply not getting the results we would like. Accordingly, I am referring her to [facility] for further  evaluation. She is very much in favor of this idea.  I am also going to order a brain MRI for further evaluation of increased headaches with dizziness and change in vision. I will see her back in 7-10 days to review those results.  Otherwise, we will not change our current treatment regimen which includes tamoxifen daily. She already has a standing followup appointment with April and we will keep that on the books as well.  Gizelle voices understanding and agreement  with this plan, and will call with any changes or problems.    Karen Hopkins    07/18/2012

## 2012-07-18 NOTE — Telephone Encounter (Signed)
gv pt appt schedule for February. lmonvm for [facility] @ [facility] [phone]) requesting an appt. Pt aware central will call re mri and I will contact her re referral to [facility]. Pt also has information for [facility] in the event she need to make this call herself.

## 2012-07-19 ENCOUNTER — Encounter: Payer: Medicare Other | Admitting: Physical Medicine and Rehabilitation

## 2012-07-19 ENCOUNTER — Telehealth: Payer: Self-pay

## 2012-07-19 ENCOUNTER — Ambulatory Visit: Payer: Medicare Other

## 2012-07-19 MED ORDER — HYDROCODONE-ACETAMINOPHEN 5-325 MG PO TABS: 1.0000 | ORAL_TABLET | Freq: Three times a day (TID) | ORAL | Status: DC | PRN

## 2012-07-19 NOTE — Telephone Encounter (Signed)
Hydrocodone called in 

## 2012-07-21 ENCOUNTER — Ambulatory Visit: Payer: Medicare Other

## 2012-07-25 ENCOUNTER — Ambulatory Visit (HOSPITAL_COMMUNITY)
Admission: RE | Admit: 2012-07-25 | Discharge: 2012-07-25 | Disposition: A | Discharge status: Home / Self Care | Payer: Medicare Other | Source: Ambulatory Visit | Attending: Physician Assistant | Admitting: Physician Assistant

## 2012-07-25 DIAGNOSIS — C50919 Malignant neoplasm of unspecified site of unspecified female breast: Secondary | ICD-10-CM | POA: Insufficient documentation

## 2012-07-25 DIAGNOSIS — R42 Dizziness and giddiness: Secondary | ICD-10-CM | POA: Insufficient documentation

## 2012-07-25 DIAGNOSIS — R51 Headache: Secondary | ICD-10-CM | POA: Insufficient documentation

## 2012-07-25 DIAGNOSIS — J3489 Other specified disorders of nose and nasal sinuses: Secondary | ICD-10-CM | POA: Insufficient documentation

## 2012-07-25 DIAGNOSIS — H538 Other visual disturbances: Secondary | ICD-10-CM | POA: Insufficient documentation

## 2012-07-25 MED ORDER — GADOBENATE DIMEGLUMINE 529 MG/ML IV SOLN
20.0000 mL | Freq: Once | INTRAVENOUS | Status: AC | PRN
  Administered 2012-07-25: 20 mL via INTRAVENOUS

## 2012-07-27 ENCOUNTER — Ambulatory Visit: Payer: Medicare Other

## 2012-07-27 ENCOUNTER — Other Ambulatory Visit: Payer: Self-pay | Admitting: *Deleted

## 2012-07-27 MED ORDER — LORATADINE-PSEUDOEPHEDRINE ER 10-240 MG PO TB24: 1.0000 | ORAL_TABLET | Freq: Every day | ORAL | Status: DC

## 2012-07-27 NOTE — Telephone Encounter (Signed)
This RN called pt per results of brain MRI per review of AB/PA. Discussed issue relating to sinuses and need for medication intervention recommendation of claritin d or zyrtec d as well aggressive fluid intake.  This RN called to pharmacy and verified if either medication covered by pt's insurance if ordered under a prescription. claritin D is covered.  Medication above given as order.

## 2012-07-28 ENCOUNTER — Telehealth: Payer: Self-pay | Admitting: Oncology

## 2012-07-28 ENCOUNTER — Encounter: Payer: Medicare Other | Admitting: Physical Therapy

## 2012-07-28 ENCOUNTER — Ambulatory Visit (HOSPITAL_BASED_OUTPATIENT_CLINIC_OR_DEPARTMENT_OTHER): Payer: Medicare Other | Admitting: Physician Assistant

## 2012-07-28 ENCOUNTER — Encounter: Payer: Self-pay | Admitting: Physician Assistant

## 2012-07-28 ENCOUNTER — Other Ambulatory Visit: Payer: Self-pay | Admitting: *Deleted

## 2012-07-28 VITALS — BP 164/86 | HR 105 | Temp 98.4°F | Resp 20 | Ht 64.0 in | Wt 280.4 lb

## 2012-07-28 DIAGNOSIS — C50919 Malignant neoplasm of unspecified site of unspecified female breast: Secondary | ICD-10-CM

## 2012-07-28 DIAGNOSIS — C50419 Malignant neoplasm of upper-outer quadrant of unspecified female breast: Secondary | ICD-10-CM

## 2012-07-28 DIAGNOSIS — F329 Major depressive disorder, single episode, unspecified: Secondary | ICD-10-CM

## 2012-07-28 DIAGNOSIS — J328 Other chronic sinusitis: Secondary | ICD-10-CM

## 2012-07-28 DIAGNOSIS — C773 Secondary and unspecified malignant neoplasm of axilla and upper limb lymph nodes: Secondary | ICD-10-CM

## 2012-07-28 DIAGNOSIS — J329 Chronic sinusitis, unspecified: Secondary | ICD-10-CM

## 2012-07-28 DIAGNOSIS — G8929 Other chronic pain: Secondary | ICD-10-CM

## 2012-07-28 DIAGNOSIS — E669 Obesity, unspecified: Secondary | ICD-10-CM

## 2012-07-28 MED ORDER — CIPROFLOXACIN HCL 500 MG PO TABS: 500.0000 mg | ORAL_TABLET | Freq: Two times a day (BID) | ORAL | Status: DC

## 2012-07-28 MED ORDER — ALPRAZOLAM ER 1 MG PO TB24: 1.0000 mg | ORAL_TABLET | Freq: Every day | ORAL | Status: DC

## 2012-07-28 MED ORDER — FLUTICASONE PROPIONATE 50 MCG/ACT NA SUSP: NASAL | Status: DC

## 2012-07-28 NOTE — Telephone Encounter (Signed)
Received a return call from [place] at [facility] [phone]). Per Marcelino Duster pt has to come on a walk in basis Mon - Fri 8am -3pm. S/w pt she is aware and has location and phone #.

## 2012-07-28 NOTE — Progress Notes (Signed)
ID: Karen Hopkins   DOB: [birth year]  MR#: [id number]  WUJ#:[id number]  PCP: MATTHEWS,CODY, DO GYN: SU: Cicero Duck OTHER MD: Claudette Laws  HISTORY OF PRESENT ILLNESS: The patient noted swelling and redness, as well as some tenderness, over her right breast for some time. She thought this might be related to pregnancy, since she delivered her third child about 8 months ago. As the problem did not improve, she brought it to her primary care physician's attention. He treated her with Bactrim, which she says she could not tolerate because of nausea and vomiting, but which in any case did not resolve the problem, so he set her up for mammography at [facility] 08/26/2009. Dr. Deboraha Sprang found by exam marked skin thickening and erythema of the right breast extending pretty much throughout the breast. By mammography there were scattered fibroglandular densities, and an asymmetric density in the right upper outer quadrant with suspicious microcalcifications. There were also enlarged right axillary lymph nodes. The left appeared normal. By ultrasound there was an ill defined area of hypoechoic tissue in the right upper outer quadrant that was difficult to measure. There were also abnormal right axillary lymph nodes.  Ultrasound guided biopsy was performed the same day, and showed [phone]) a poorly differentiated invasive ductal carcinoma, which was estrogen receptor poor at 3%, progesterone receptor positive at 14% with an elevated proliferation marker at 50%, but importantly with strong amplification of HER-2 by CISH with a ratio of 5.20.  Bilateral breast MRIs were obtained 09/02/2009. This showed an area up to 10.6 cm in the right breast showing confluent mass and non-mass enhancement. There was marked skin thickening involving the entire right breast, and numerous bulky right axillary lymph nodes were identified. The left breast was unremarkable, and there was no evidence of internal mammary lymph  node involvement.  Patient was treated in the neoadjuvant setting with 4 dose dense cycles of doxorubicin and cyclophosphamide, followed by 12 weekly doses of paclitaxel and trastuzumab. Jana underwent definitive right modified radical mastectomy in October 2011, with a residual 2.5 mm area of tumor in the breast, grade 3, and one of 21 lymph nodes involved. Her subsequent history is as detailed below  INTERVAL HISTORY: Karen Hopkins returns today to review the results of her recent brain MRI. She continues to have problems with sinusitis, which is exactly what the brain MRI showed. We have started her on Claritin-D. She continues to have sinus related headaches. She feels like there is "pressure" behind her eyes. She has both a sinus congestion and rhinorrhea. This has been going on for several weeks. Fortunately, she denies any fevers or chills.  Karen Hopkins had a period last week, and tells me it was especially heavy. She is past due for her pelvic exam and Pap, as well as a routine physical.   REVIEW OF SYSTEMS: [name] has chronic pain   diffusely throughout the body, but especially affecting her joints, her right chest wall, and her right arm. She has chronic lymphedema in the right upper extremity for which she is seen at the lymphedema clinic regularly. She has a difficult time sleeping and has subsequent fatigue. She denies any nausea or emesis. She's had no change in bowel or bladder habits. She denies any cough or phlegm production but does have shortness of breath with even minor exertion. She's had no chest pain or palpitations.  A detailed review of systems is otherwise stable and noncontributory.   PAST MEDICAL HISTORY: Past Medical History  Diagnosis Date  .  Breast cancer stage 3    radiation/chemo/ right mastectomy  . Seasonal allergies   . Hypertension     PAST SURGICAL HISTORY: Past Surgical History  Procedure Laterality Date  . Mastectomy  right sided complete  . Hernia repair   2006  . Nasal septum surgery      FAMILY HISTORY Family History  Problem Relation Age of Onset  . Hypertension Mother   . Diabetes type II Father   . Prostate cancer Father   There is no history of breast or ovarian cancer in the immediate family, but of the patient's mother's mother's six sisters, two (the patient's great-aunts) had ovarian cancer.  GYNECOLOGIC HISTORY: The patient is GX, P3. First child was premature. Age at first delivery, 37 years old.   SOCIAL HISTORY: (Updated December 2013) She is currently disabled. [name]'s children are [name], and [name], aged 35, 8, and 3.. They are all boys, all at home. The patient attends a non-denominational church in [place], which she considers her home, but she is currently living in [place].   ADVANCED DIRECTIVES:  HEALTH MAINTENANCE: History  Substance Use Topics  . Smoking status: Former Smoker    Types: Cigarettes    Quit date: 04/26/2010  . Smokeless tobacco: Never Used  . Alcohol Use: No     Colonoscopy: Never  PAP: 2012  Bone density: Never  Lipid panel:  Allergies  Allergen Reactions  . Lyrica (Pregabalin)   . Meloxicam Nausea Only  . Penicillins     REACTION: Facial swelling  . Robaxin (Methocarbamol) Nausea Only    Current Outpatient Prescriptions  Medication Sig Dispense Refill  . albuterol (PROVENTIL HFA;VENTOLIN HFA) 108 (90 BASE) MCG/ACT inhaler Inhale 2 puffs into the lungs every 6 (six) hours as needed for wheezing.  1 Inhaler  1  . ALPRAZolam (XANAX XR) 1 MG 24 hr tablet Take 1 tablet (1 mg total) by mouth daily.  30 tablet  0  . citalopram (CELEXA) 20 MG tablet Take 1 tablet (20 mg total) by mouth daily.  30 tablet  5  . diclofenac sodium (VOLTAREN) 1 % GEL Apply 2 g topically 4 (four) times daily.  2 Tube  2  . HYDROcodone-acetaminophen (NORCO) 5-325 MG per tablet Take 1 tablet by mouth every 8 (eight) hours as needed for pain.  60 tablet  0  . loratadine-pseudoephedrine  (CLARITIN-D 24 HOUR) 10-240 MG per 24 hr tablet Take 1 tablet by mouth daily.  30 tablet  1  . naproxen (NAPROSYN) 500 MG tablet       . tamoxifen (NOLVADEX) 20 MG tablet Take 1 tablet (20 mg total) by mouth daily.  30 tablet  5  . traMADol (ULTRAM) 50 MG tablet Take 1 tablet (50 mg total) by mouth every 6 (six) hours as needed.  120 tablet  3  . triamterene-hydrochlorothiazide (MAXZIDE-25) 37.5-25 MG per tablet Take 1 tablet by mouth daily.      . ciprofloxacin (CIPRO) 500 MG tablet Take 1 tablet (500 mg total) by mouth 2 (two) times daily.  20 tablet  0  . fluticasone (FLONASE) 50 MCG/ACT nasal spray 1 spray into each nostril BID  16 g  3   No current facility-administered medications for this visit.    OBJECTIVE: Young [place] American woman who appears tired, anxious, and depressed Filed Vitals:   07/28/12 0917  BP: 164/86  Pulse: 105  Temp: 98.4 F (36.9 C)  Resp: 20     Body mass index is 48.11 kg/(m^2).  ECOG FS: 2  Filed Weights   07/28/12 0917  Weight: 280 lb 6.4 oz (127.189 kg)    Oropharynx clear; no pharyngeal erythema. Nasal mucosa is erythematous and edematous bilaterally, greater on the right than the left. Poor light reflex and evidence of fluid behind the TM of the right ear. Maxillary sinuses are tender to palpation. No cervical or supraclavicular adenopathy Lungs no rales or rhonchi     LAB RESULTS: Lab Results  Component Value Date   WBC 8.4 05/25/2012   NEUTROABS 5.0 05/25/2012   HGB 12.6 05/25/2012   HCT 39.1 05/25/2012   MCV 88.5 05/25/2012   PLT 250 05/25/2012      Chemistry      Component Value Date/Time   NA 139 05/25/2012 1016   NA 138 11/24/2011 0903   K 3.5 05/25/2012 1016   K 3.2* 11/24/2011 0903   CL 105 05/25/2012 1016   CL 103 11/24/2011 0903   CO2 27 05/25/2012 1016   CO2 27 11/24/2011 0903   BUN 11.0 05/25/2012 1016   BUN 13 11/24/2011 0903   CREATININE 0.7 05/25/2012 1016   CREATININE 0.58 11/24/2011 0903      Component Value  Date/Time   CALCIUM 9.8 05/25/2012 1016   CALCIUM 9.1 11/24/2011 0903   ALKPHOS 69 05/25/2012 1016   ALKPHOS 65 11/24/2011 0903   AST 14 05/25/2012 1016   AST 13 11/24/2011 0903   ALT 16 05/25/2012 1016   ALT 13 11/24/2011 0903   BILITOT 0.22 05/25/2012 1016   BILITOT 0.1* 11/24/2011 0903       Lab Results  Component Value Date   LABCA2 22 05/25/2012     STUDIES: Left mammogram May 2013 was benign   ASSESSMENT: 37 year old BRCA1 and BRCA2 negative [place] woman, status post right breast biopsy in March 2011 for a high-grade invasive ductal carcinoma, ER positive 3%, PR positive 14%, strongly HER2/neu positive with MIB-1 of 15%. Biopsy proven lymph node involvement at presentation. Changes consistent with inflammatory carcinoma.   1. Treated neoadjuvantly, status post 4 dose-dense cycles of doxorubicin/cyclophosphamide, followed by 12 weekly doses of paclitaxel with trastuzumab completed September 2011.   2. Trastuzumab was then continued for a total of 1 year [to June 2012]. Post-treatment echo showed a well-preserved ejection fraction.   3. Status post right modified radical mastectomy in October 2011 showing a ypT1a ypN1 invasive ductal carcinoma, grade 3.   4. postmastectomy radiation completed in January 2012  5. on tamoxifen beginning January of 2012  6. other problems include chronic postsurgical pain and chronic right upper extremity lymphedema  PLAN: Fortunately,Satya's brain MRI showed no evidence of acute changes, with the exception of sinusitis. We started her on Claritin-D, and I am adding Cipro 500 mg by mouth twice a day for the next 10 days, in addition to Flonase for the next 2-3 months.    I am also scheduling her to see Dr. Ashley Royalty, her primary care physician, for routine physical including a pelvic and PAP. Certainly, if the sinusitis symptoms are continuing, she can review this with him as well.  Last week, I requested a referral to [facility] . She has not heard from them with regards to her appointment, so we'll have our schedulers check on that again today.  Otherwise, we will not change our current treatment regimen which includes tamoxifen daily. She already has a standing followup appointment with April but knows to call with any changes or problems prior to that appointment.  Ezme Duch    07/28/2012

## 2012-07-28 NOTE — Telephone Encounter (Signed)
, °

## 2012-08-01 ENCOUNTER — Ambulatory Visit: Payer: Medicare Other | Admitting: Physical Therapy

## 2012-08-08 ENCOUNTER — Encounter: Payer: Medicare Other | Admitting: Family Medicine

## 2012-08-08 ENCOUNTER — Ambulatory Visit: Payer: Medicare Other | Attending: Oncology

## 2012-08-08 DIAGNOSIS — M25519 Pain in unspecified shoulder: Secondary | ICD-10-CM | POA: Insufficient documentation

## 2012-08-08 DIAGNOSIS — M25619 Stiffness of unspecified shoulder, not elsewhere classified: Secondary | ICD-10-CM | POA: Insufficient documentation

## 2012-08-08 DIAGNOSIS — IMO0001 Reserved for inherently not codable concepts without codable children: Secondary | ICD-10-CM | POA: Insufficient documentation

## 2012-08-09 ENCOUNTER — Other Ambulatory Visit: Payer: Self-pay | Admitting: *Deleted

## 2012-08-09 DIAGNOSIS — C50911 Malignant neoplasm of unspecified site of right female breast: Secondary | ICD-10-CM

## 2012-08-09 DIAGNOSIS — C50919 Malignant neoplasm of unspecified site of unspecified female breast: Secondary | ICD-10-CM

## 2012-08-09 MED ORDER — TAMOXIFEN CITRATE 20 MG PO TABS: 20.0000 mg | ORAL_TABLET | Freq: Every day | ORAL | Status: DC

## 2012-08-15 ENCOUNTER — Ambulatory Visit: Payer: Medicare Other

## 2012-08-15 ENCOUNTER — Ambulatory Visit: Payer: Medicare Other | Admitting: Physical Medicine & Rehabilitation

## 2012-08-15 DIAGNOSIS — M25519 Pain in unspecified shoulder: Secondary | ICD-10-CM | POA: Insufficient documentation

## 2012-08-15 DIAGNOSIS — Z9010 Acquired absence of unspecified breast and nipple: Secondary | ICD-10-CM | POA: Insufficient documentation

## 2012-08-15 DIAGNOSIS — R0789 Other chest pain: Secondary | ICD-10-CM | POA: Insufficient documentation

## 2012-08-15 DIAGNOSIS — M62838 Other muscle spasm: Secondary | ICD-10-CM | POA: Insufficient documentation

## 2012-08-16 ENCOUNTER — Telehealth: Payer: Self-pay | Admitting: Oncology

## 2012-08-16 ENCOUNTER — Encounter: Payer: Medicare Other | Admitting: Family Medicine

## 2012-08-16 NOTE — Telephone Encounter (Signed)
S/w the pt and she is aware of her appt with dr Etter Sjogren the plastic surgeon in April 2014. gve the pt the name oand the phone number to their office.

## 2012-08-17 ENCOUNTER — Ambulatory Visit: Payer: Medicare Other | Admitting: Physical Therapy

## 2012-08-22 ENCOUNTER — Telehealth: Payer: Self-pay

## 2012-08-22 MED ORDER — HYDROCODONE-ACETAMINOPHEN 5-325 MG PO TABS: 1.0000 | ORAL_TABLET | Freq: Three times a day (TID) | ORAL | Status: DC | PRN

## 2012-08-22 NOTE — Telephone Encounter (Signed)
Patient called for hydrocodone refill.  Refill called in patient advised she must keep appointment to uphold controlled substance agreement.  Patient told to be in office at 230 for a 245 appointment.

## 2012-08-24 ENCOUNTER — Ambulatory Visit: Payer: Medicare Other

## 2012-08-28 ENCOUNTER — Ambulatory Visit (HOSPITAL_BASED_OUTPATIENT_CLINIC_OR_DEPARTMENT_OTHER): Payer: Medicare Other | Admitting: Physical Medicine & Rehabilitation

## 2012-08-28 ENCOUNTER — Encounter: Payer: Medicare Other | Attending: Physical Medicine and Rehabilitation

## 2012-08-28 ENCOUNTER — Encounter: Payer: Self-pay | Admitting: Physical Medicine & Rehabilitation

## 2012-08-28 VITALS — BP 143/88 | HR 98 | Resp 14 | Ht 64.0 in | Wt 270.0 lb

## 2012-08-28 DIAGNOSIS — Z79899 Other long term (current) drug therapy: Secondary | ICD-10-CM

## 2012-08-28 DIAGNOSIS — IMO0001 Reserved for inherently not codable concepts without codable children: Secondary | ICD-10-CM

## 2012-08-28 DIAGNOSIS — Z5181 Encounter for therapeutic drug level monitoring: Secondary | ICD-10-CM

## 2012-08-28 NOTE — Patient Instructions (Signed)
Next appointment is with Karen Hopkins may support your right elbow and standing to reduce pain in the right shoulder area

## 2012-08-28 NOTE — Progress Notes (Signed)
Subjective:    Patient ID: Karen Hopkins, female    DOB: Nov 30, 1975, 37 y.o.   MRN: [id number]  HPI Seen by oncology PA, MRI of the brain reviewed. This showed sinusitis. This was ordered for jitteriness. Referral to psychiatry for depression was made. Patient has been seeing Clydie Braun PA. Has been started on hydrocodone twice a day which is helping her with her pain.  Pain Inventory Average Pain 8 Pain Right Now 7 My pain is intermittent, sharp, dull, stabbing, tingling and aching  In the last 24 hours, has pain interfered with the following? General activity 4 Relation with others 4 Enjoyment of life 4 What TIME of day is your pain at its worst? daytime and night Sleep (in general) Poor  Pain is worse with: walking, bending, inactivity, standing and some activites Pain improves with: medication Relief from Meds: 7  Mobility walk without assistance how many minutes can you walk? 15 ability to climb steps?  yes do you drive?  yes  Function disabled: date disabled see chart  Neuro/Psych numbness spasms depression anxiety  Prior Studies Any changes since last visit?  no  Physicians involved in your care Any changes since last visit?  no   Family History  Problem Relation Age of Onset  . Hypertension Mother   . Diabetes type II Father   . Prostate cancer Father    History   Social History  . Marital Status: Single    Spouse Name: N/A    Number of Children: N/A  . Years of Education: N/A   Social History Main Topics  . Smoking status: Former Smoker    Types: Cigarettes    Quit date: 04/26/2010  . Smokeless tobacco: Never Used  . Alcohol Use: No  . Drug Use: None  . Sexually Active: None   Other Topics Concern  . None   Social History Narrative  . None   Past Surgical History  Procedure Laterality Date  . Mastectomy  right sided complete  . Hernia repair  2006  . Nasal septum surgery     Past Medical History  Diagnosis Date  . Breast cancer  stage 3    radiation/chemo/ right mastectomy  . Seasonal allergies   . Hypertension    BP 143/88  Pulse 98  Resp 14  Ht 5\' 4"  (1.626 m)  Wt 270 lb (122.471 kg)  BMI 46.32 kg/m2  SpO2 96%    Review of Systems  Constitutional: Positive for unexpected weight change.  Gastrointestinal: Positive for abdominal pain.  Musculoskeletal:       Spasms  Neurological: Positive for numbness.  Psychiatric/Behavioral: Positive for dysphoric mood. The patient is nervous/anxious.   All other systems reviewed and are negative.       Objective:   Physical Exam  Nursing note and vitals reviewed. Constitutional: She is oriented to [place], place, and time. She appears well-developed. No distress.  Morbidly obese  HENT:  Head: Normocephalic and atraumatic.  Eyes: Conjunctivae and EOM are normal. Pupils are equal, round, and reactive to light.  Neck: Normal range of motion. Neck supple.  Musculoskeletal:       Right shoulder: She exhibits tenderness.       Left shoulder: Normal.  Mild restriction internal/external rotation is able to place her hands behind her head and behind her back equally  Tenderness to palpation right Levator scapula and right trapezius  Neurological: She is alert and oriented to [place], place, and time. She has normal reflexes.  Psychiatric: She  has a normal mood and affect.          Assessment & Plan:  1. Chronic myofascial pain syndrome related to postoperative lymphedema after treatment for breast carcinoma on the right Range of motion is improved so that frozen shoulder symptoms have subsided She is on low-dose hydrocodone 5 mg twice a day Followup PA clinic 3 months I'll see her on a when necessary basis, if she has worsening of shoulder range of motion I would recommend ultrasound of the shoulder diagnostic with possible injection

## 2012-08-29 ENCOUNTER — Ambulatory Visit: Payer: Medicare Other

## 2012-08-29 ENCOUNTER — Other Ambulatory Visit: Payer: Self-pay | Admitting: *Deleted

## 2012-08-29 DIAGNOSIS — C50919 Malignant neoplasm of unspecified site of unspecified female breast: Secondary | ICD-10-CM

## 2012-08-29 MED ORDER — ALPRAZOLAM ER 1 MG PO TB24: 1.0000 mg | ORAL_TABLET | Freq: Every day | ORAL | Status: DC

## 2012-08-31 ENCOUNTER — Ambulatory Visit: Payer: Medicare Other | Admitting: Physical Therapy

## 2012-09-21 ENCOUNTER — Telehealth: Payer: Self-pay

## 2012-09-21 MED ORDER — HYDROCODONE-ACETAMINOPHEN 5-325 MG PO TABS: 1.0000 | ORAL_TABLET | Freq: Three times a day (TID) | ORAL | Status: DC | PRN

## 2012-09-21 NOTE — Telephone Encounter (Signed)
Patient called requesting hydrocodone refill.  [facility]ill called [facility].  Patient aware.

## 2012-09-26 ENCOUNTER — Other Ambulatory Visit: Payer: Self-pay | Admitting: *Deleted

## 2012-09-26 DIAGNOSIS — C50919 Malignant neoplasm of unspecified site of unspecified female breast: Secondary | ICD-10-CM

## 2012-09-27 ENCOUNTER — Encounter: Payer: Self-pay | Admitting: Physician Assistant

## 2012-09-27 ENCOUNTER — Telehealth: Payer: Self-pay | Admitting: Oncology

## 2012-09-27 ENCOUNTER — Other Ambulatory Visit (HOSPITAL_BASED_OUTPATIENT_CLINIC_OR_DEPARTMENT_OTHER): Payer: Medicare Other | Admitting: Lab

## 2012-09-27 ENCOUNTER — Ambulatory Visit (HOSPITAL_BASED_OUTPATIENT_CLINIC_OR_DEPARTMENT_OTHER): Payer: Medicare Other | Admitting: Physician Assistant

## 2012-09-27 VITALS — BP 127/77 | HR 111 | Temp 98.6°F | Resp 20 | Ht 64.0 in | Wt 265.0 lb

## 2012-09-27 DIAGNOSIS — Z1231 Encounter for screening mammogram for malignant neoplasm of breast: Secondary | ICD-10-CM

## 2012-09-27 DIAGNOSIS — C50919 Malignant neoplasm of unspecified site of unspecified female breast: Secondary | ICD-10-CM

## 2012-09-27 DIAGNOSIS — I890 Lymphedema, not elsewhere classified: Secondary | ICD-10-CM

## 2012-09-27 DIAGNOSIS — J3489 Other specified disorders of nose and nasal sinuses: Secondary | ICD-10-CM

## 2012-09-27 DIAGNOSIS — IMO0001 Reserved for inherently not codable concepts without codable children: Secondary | ICD-10-CM

## 2012-09-27 DIAGNOSIS — R0981 Nasal congestion: Secondary | ICD-10-CM

## 2012-09-27 DIAGNOSIS — C50911 Malignant neoplasm of unspecified site of right female breast: Secondary | ICD-10-CM

## 2012-09-27 LAB — COMPREHENSIVE METABOLIC PANEL (CC13)
ALT: 11 U/L (ref 0–55)
Albumin: 3.2 g/dL — ABNORMAL LOW (ref 3.5–5.0)
CO2: 25 mEq/L (ref 22–29)
Chloride: 105 mEq/L (ref 98–107)
Potassium: 3.5 mEq/L (ref 3.5–5.1)
Sodium: 140 mEq/L (ref 136–145)
Total Bilirubin: <0.2 mg/dL (ref 0.20–1.20)
Total Protein: 7.4 g/dL (ref 6.4–8.3)

## 2012-09-27 LAB — CBC WITH DIFFERENTIAL/PLATELET
BASO%: 0.1 % (ref 0.0–2.0)
MCHC: 33.3 g/dL (ref 31.5–36.0)
MONO#: 0.4 10*3/uL (ref 0.1–0.9)
RBC: 4.42 10*6/uL (ref 3.70–5.45)
RDW: 13.6 % (ref 11.2–14.5)
WBC: 8.4 10*3/uL (ref 3.9–10.3)
lymph#: 2.9 10*3/uL (ref 0.9–3.3)

## 2012-09-27 MED ORDER — LORATADINE-PSEUDOEPHEDRINE ER 10-240 MG PO TB24: 1.0000 | ORAL_TABLET | Freq: Every day | ORAL | Status: DC

## 2012-09-27 MED ORDER — ALPRAZOLAM ER 1 MG PO TB24: 1.0000 mg | ORAL_TABLET | Freq: Every day | ORAL | Status: DC

## 2012-09-27 NOTE — Progress Notes (Signed)
ID: Karen Hopkins   DOB: March 19, 1976  MR#: [id number]  OZH#:[id number]  PCP: Hopkins,CODY, DO GYN: SU: Cicero Duck OTHER MD: Karen Hopkins  HISTORY OF PRESENT ILLNESS: The patient noted swelling and redness, as well as some tenderness, over her right breast for some time. She thought this might be related to pregnancy, since she delivered her third child about 8 months ago. As the problem did not improve, she brought it to her primary care physician's attention. He treated her with Bactrim, which she says she could not tolerate because of nausea and vomiting, but which in any case did not resolve the problem, so he set her up for mammography at [facility] 08/26/2009. Dr. Deboraha Hopkins found by exam marked skin thickening and erythema of the right breast extending pretty much throughout the breast. By mammography there were scattered fibroglandular densities, and an asymmetric density in the right upper outer quadrant with suspicious microcalcifications. There were also enlarged right axillary lymph nodes. The left appeared normal. By ultrasound there was an ill defined area of hypoechoic tissue in the right upper outer quadrant that was difficult to measure. There were also abnormal right axillary lymph nodes.  Ultrasound guided biopsy was performed the same day, and showed [phone] a poorly differentiated invasive ductal carcinoma, which was estrogen receptor poor at 3%, progesterone receptor positive at 14% with an elevated proliferation marker at 50%, but importantly with strong amplification of HER-2 by CISH with a ratio of 5.20.  Bilateral breast MRIs were obtained 09/02/2009. This showed an area up to 10.6 cm in the right breast showing confluent mass and non-mass enhancement. There was marked skin thickening involving the entire right breast, and numerous bulky right axillary lymph nodes were identified. The left breast was unremarkable, and there was no evidence of internal mammary lymph  node involvement.  Patient was treated in the neoadjuvant setting with 4 dose dense cycles of doxorubicin and cyclophosphamide, followed by 12 weekly doses of paclitaxel and trastuzumab. Karen Hopkins underwent definitive right modified radical mastectomy in October 2011, with a residual 2.5 mm area of tumor in the breast, grade 3, and one of 21 lymph nodes involved. Her subsequent history is as detailed below  INTERVAL HISTORY: Karen Hopkins returns today accompanied by her youngest son, this Karen Hopkins, who will be turning 35 years old soon. She's here for followup of her right breast carcinoma, for which she continues on tamoxifen daily. She has occasional hot flashes. She continues to have chronic pain which is followed through Dr.  Wynn Hopkins office and she is seen on a regular basis. She's also been followed at the lymphedema clinic regularly.  Karen Hopkins has started having apparent menstrual cycles again, with LMP of 414. She tells me it lasted only 3 days but was "very heavy". We previously scheduled her to see her primary care physician, Dr. Ashley Hopkins, for physical to include pelvic and Pap, but unfortunately she was unable to keep that appointment.   REVIEW OF SYSTEMS: Karen Hopkins has had no fevers or chills. She denies any skin changes and has had no abnormal bruising or bleeding. She's eating and drinking well, and is trying to eat healthier foods and drink more water. She has been able to lose a little weight as a result. She's had no nausea or change in bowel or bladder habits. She denies any cough, phlegm production, or pleurisy. She does have some shortness of breath with exertion which is not new and has not worsened. She's had some sinus congestion which is chronic  and is treated quite well with Claritin-D. She's had no epistasis. She denies any chest pain or palpitations. She's had no abnormal headaches or dizziness. She denies any new peripheral swelling, other than the chronic right upper extremity lymphedema.  She continues to have quite a bit of anxiety for which she continues on Xanax.  A detailed review of systems is otherwise stable and noncontributory.     PAST MEDICAL HISTORY: Past Medical History  Diagnosis Date  . Breast cancer stage 3    radiation/chemo/ right mastectomy  . Seasonal allergies   . Hypertension     PAST SURGICAL HISTORY: Past Surgical History  Procedure Laterality Date  . Mastectomy  right sided complete  . Hernia repair  2006  . Nasal septum surgery      FAMILY HISTORY Family History  Problem Relation Age of Onset  . Hypertension Mother   . Diabetes type II Father   . Prostate cancer Father   There is no history of breast or ovarian cancer in the immediate family, but of the patient's mother's mother's six sisters, two (the patient's great-aunts) had ovarian cancer.  GYNECOLOGIC HISTORY: The patient is GX, P3. First child was premature. Age at first delivery, 37 years old.   SOCIAL HISTORY: (Updated December 2013) She is currently disabled. Karen Hopkins's children are Karen Hopkins, and Karen Hopkins, aged 53, 8, and 3.. They are all boys, all at home. The patient attends a non-denominational church in [place], which she considers her home, but she is currently living in [place].   ADVANCED DIRECTIVES:  HEALTH MAINTENANCE: History  Substance Use Topics  . Smoking status: Former Smoker    Types: Cigarettes    Quit date: 04/26/2010  . Smokeless tobacco: Never Used  . Alcohol Use: No     Colonoscopy: Never  PAP: 2012  Bone density: Never  Lipid panel:  Allergies  Allergen Reactions  . Lyrica (Pregabalin)   . Meloxicam Nausea Only  . Penicillins     REACTION: Facial swelling  . Robaxin (Methocarbamol) Nausea Only    Current Outpatient Prescriptions  Medication Sig Dispense Refill  . albuterol (PROVENTIL HFA;VENTOLIN HFA) 108 (90 BASE) MCG/ACT inhaler Inhale 2 puffs into the lungs every 6 (six) hours as needed for wheezing.  1 Inhaler  1  .  ALPRAZolam (XANAX XR) 1 MG 24 hr tablet Take 1 tablet (1 mg total) by mouth daily.  30 tablet  0  . citalopram (CELEXA) 20 MG tablet Take 1 tablet (20 mg total) by mouth daily.  30 tablet  5  . diclofenac sodium (VOLTAREN) 1 % GEL Apply 2 g topically 4 (four) times daily.  2 Tube  2  . HYDROcodone-acetaminophen (NORCO) 5-325 MG per tablet Take 1 tablet by mouth every 8 (eight) hours as needed for pain.  60 tablet  0  . loratadine-pseudoephedrine (CLARITIN-D 24 HOUR) 10-240 MG per 24 hr tablet Take 1 tablet by mouth daily.  30 tablet  2  . naproxen (NAPROSYN) 500 MG tablet       . tamoxifen (NOLVADEX) 20 MG tablet Take 1 tablet (20 mg total) by mouth daily.  90 tablet  3  . traMADol (ULTRAM) 50 MG tablet Take 1 tablet (50 mg total) by mouth every 6 (six) hours as needed.  120 tablet  3  . triamterene-hydrochlorothiazide (MAXZIDE-25) 37.5-25 MG per tablet Take 1 tablet by mouth daily.      . ciprofloxacin (CIPRO) 500 MG tablet Take 1 tablet (500 mg total) by mouth 2 (two)  times daily.  20 tablet  0  . fluticasone (FLONASE) 50 MCG/ACT nasal spray 1 spray into each nostril BID  16 g  3   No current facility-administered medications for this visit.    OBJECTIVE: Young African American woman who appears tired and anxious Filed Vitals:   09/27/12 1058  BP: 127/77  Pulse: 111  Temp: 98.6 F (37 C)  Resp: 20     Body mass index is 45.46 kg/(m^2).    ECOG FS: 2   Filed Weights   09/27/12 1058  Weight: 265 lb (120.203 kg)   Physical Exam: HEENT:  Sclerae anicteric.  Oropharynx clear.  Nodes:  No cervical or supraclavicular lymphadenopathy palpated.  Breast Exam: Patient status post right mastectomy with no skin changes and no evidence of local recurrence. Left breast is unremarkable. Axillae are benign bilaterally with no palpable adenopathy noted. Lungs:  Clear to auscultation bilaterally.  No crackles wheezes or rhonchi. Heart:  Regular rate and rhythm.   Abdomen:  Soft,  Obese,nontender.   Positive bowel sounds.  Musculoskeletal:  No focal spinal tenderness to palpation.  Extremities:  Lymphedema noted in the right upper extremity, with compression sleeve in place.  Neuro:  Nonfocal. Well oriented with anxious affect     LAB RESULTS: Lab Results  Component Value Date   WBC 8.4 09/27/2012   NEUTROABS 4.8 09/27/2012   HGB 12.9 09/27/2012   HCT 38.7 09/27/2012   MCV 87.6 09/27/2012   PLT 260 09/27/2012      Chemistry      Component Value Date/Time   NA 139 05/25/2012 1016   NA 138 11/24/2011 0903   K 3.5 05/25/2012 1016   K 3.2* 11/24/2011 0903   CL 105 05/25/2012 1016   CL 103 11/24/2011 0903   CO2 27 05/25/2012 1016   CO2 27 11/24/2011 0903   BUN 11.0 05/25/2012 1016   BUN 13 11/24/2011 0903   CREATININE 0.7 05/25/2012 1016   CREATININE 0.58 11/24/2011 0903      Component Value Date/Time   CALCIUM 9.8 05/25/2012 1016   CALCIUM 9.1 11/24/2011 0903   ALKPHOS 69 05/25/2012 1016   ALKPHOS 65 11/24/2011 0903   AST 14 05/25/2012 1016   AST 13 11/24/2011 0903   ALT 16 05/25/2012 1016   ALT 13 11/24/2011 0903   BILITOT 0.22 05/25/2012 1016   BILITOT 0.1* 11/24/2011 0903       Lab Results  Component Value Date   LABCA2 22 05/25/2012     STUDIES: Left mammogram May 2013 was benign   ASSESSMENT: 37 year old BRCA1 and BRCA2 negative [place] woman, status post right breast biopsy in March 2011 for a high-grade invasive ductal carcinoma, ER positive 3%, PR positive 14%, strongly HER2/neu positive with MIB-1 of 15%. Biopsy proven lymph node involvement at presentation. Changes consistent with inflammatory carcinoma.   1. Treated neoadjuvantly, status post 4 dose-dense cycles of doxorubicin/cyclophosphamide, followed by 12 weekly doses of paclitaxel with trastuzumab completed September 2011.   2. Trastuzumab was then continued for a total of 1 year [to June 2012]. Post-treatment echo showed a well-preserved ejection fraction.   3. Status post right modified radical  mastectomy in October 2011 showing a ypT1a ypN1 invasive ductal carcinoma, grade 3.   4. postmastectomy radiation completed in January 2012  5. on tamoxifen beginning January of 2012  6. other problems include chronic postsurgical pain and chronic right upper extremity lymphedema  PLAN: Taira actually seems to be doing better today. She'll continue on tamoxifen  as before. I have refilled her Claritin-D which has helped significantly with her sinus congestion. We also refilled her alprazolam prescription for another 30 days. She'll continue to be followed at the pain clinic with a prescribe her narcotic pain medications.    She was unable to keep her recent appointment with Dr. Ashley Hopkins, her primary care physician, for routine physical including a pelvic and PAP. She tells me she will call to reschedule that appointment "right away". She also plans on going for a "walking in appointment" at [facility] behavioral [facility] in the next few weeks.   [place] is due for her next mammogram in May. I will see her in 3 months, July 2014, for routine followup. She's doing well at that point with the tamoxifen, we will likely go to Q6 month followups.  Patient voices understanding and agreement with our plan, and will call with any changes or problems prior to her next scheduled appointment.  [place] Surges    09/27/2012

## 2012-09-29 NOTE — Telephone Encounter (Signed)
No entry 

## 2012-10-06 ENCOUNTER — Other Ambulatory Visit: Payer: Self-pay | Admitting: *Deleted

## 2012-10-06 DIAGNOSIS — C50919 Malignant neoplasm of unspecified site of unspecified female breast: Secondary | ICD-10-CM

## 2012-10-06 MED ORDER — CITALOPRAM HYDROBROMIDE 20 MG PO TABS: 20.0000 mg | ORAL_TABLET | Freq: Every day | ORAL | Status: DC

## 2012-10-16 ENCOUNTER — Other Ambulatory Visit: Payer: Self-pay | Admitting: *Deleted

## 2012-10-16 DIAGNOSIS — C50919 Malignant neoplasm of unspecified site of unspecified female breast: Secondary | ICD-10-CM

## 2012-10-16 MED ORDER — TRAMADOL HCL 50 MG PO TABS: 50.0000 mg | ORAL_TABLET | Freq: Four times a day (QID) | ORAL | Status: DC | PRN

## 2012-10-17 ENCOUNTER — Other Ambulatory Visit: Payer: Self-pay | Admitting: *Deleted

## 2012-10-17 MED ORDER — HYDROCODONE-ACETAMINOPHEN 5-325 MG PO TABS: 1.0000 | ORAL_TABLET | Freq: Three times a day (TID) | ORAL | Status: DC | PRN

## 2012-10-23 ENCOUNTER — Ambulatory Visit
Admission: RE | Admit: 2012-10-23 | Discharge: 2012-10-23 | Disposition: A | Discharge status: Home / Self Care | Payer: Medicare Other | Source: Ambulatory Visit | Attending: Physician Assistant | Admitting: Physician Assistant

## 2012-10-23 DIAGNOSIS — Z1231 Encounter for screening mammogram for malignant neoplasm of breast: Secondary | ICD-10-CM

## 2012-10-31 ENCOUNTER — Other Ambulatory Visit: Payer: Self-pay | Admitting: *Deleted

## 2012-11-01 ENCOUNTER — Ambulatory Visit: Payer: Medicare Other | Attending: Oncology | Admitting: Physical Therapy

## 2012-11-01 ENCOUNTER — Encounter: Payer: Self-pay | Admitting: *Deleted

## 2012-11-01 DIAGNOSIS — M25619 Stiffness of unspecified shoulder, not elsewhere classified: Secondary | ICD-10-CM | POA: Insufficient documentation

## 2012-11-01 DIAGNOSIS — IMO0001 Reserved for inherently not codable concepts without codable children: Secondary | ICD-10-CM | POA: Insufficient documentation

## 2012-11-01 DIAGNOSIS — M25519 Pain in unspecified shoulder: Secondary | ICD-10-CM | POA: Insufficient documentation

## 2012-11-01 NOTE — Progress Notes (Signed)
Follow up on refill request for Alprazolam XR from [facility]. Confirmed that refill was approved X 1 by Mena Pauls, RN on 10/31/12.

## 2012-11-03 ENCOUNTER — Ambulatory Visit: Payer: Medicare Other

## 2012-11-07 ENCOUNTER — Ambulatory Visit: Payer: Medicare Other | Attending: Oncology

## 2012-11-07 DIAGNOSIS — M25519 Pain in unspecified shoulder: Secondary | ICD-10-CM | POA: Insufficient documentation

## 2012-11-07 DIAGNOSIS — IMO0001 Reserved for inherently not codable concepts without codable children: Secondary | ICD-10-CM | POA: Insufficient documentation

## 2012-11-07 DIAGNOSIS — M25619 Stiffness of unspecified shoulder, not elsewhere classified: Secondary | ICD-10-CM | POA: Insufficient documentation

## 2012-11-08 ENCOUNTER — Ambulatory Visit: Payer: Medicare Other

## 2012-11-10 ENCOUNTER — Ambulatory Visit: Payer: Medicare Other

## 2012-11-13 ENCOUNTER — Ambulatory Visit: Payer: Medicare Other

## 2012-11-13 ENCOUNTER — Encounter: Payer: No Typology Code available for payment source | Admitting: Physical Therapy

## 2012-11-14 ENCOUNTER — Ambulatory Visit: Payer: Medicare Other

## 2012-11-15 ENCOUNTER — Other Ambulatory Visit: Payer: Self-pay

## 2012-11-15 MED ORDER — HYDROCODONE-ACETAMINOPHEN 5-325 MG PO TABS: 1.0000 | ORAL_TABLET | Freq: Three times a day (TID) | ORAL | Status: DC | PRN

## 2012-11-16 ENCOUNTER — Ambulatory Visit: Payer: Medicare Other

## 2012-11-16 ENCOUNTER — Ambulatory Visit: Payer: Medicare Other | Admitting: Physical Therapy

## 2012-11-21 ENCOUNTER — Ambulatory Visit: Payer: Medicare Other

## 2012-11-23 ENCOUNTER — Ambulatory Visit: Payer: Medicare Other

## 2012-11-27 ENCOUNTER — Ambulatory Visit: Payer: Medicare Other | Admitting: Physical Therapy

## 2012-11-28 ENCOUNTER — Encounter
Payer: Medicare Other | Attending: Physical Medicine and Rehabilitation | Admitting: Physical Medicine and Rehabilitation

## 2012-11-28 ENCOUNTER — Encounter: Payer: No Typology Code available for payment source | Admitting: Physical Therapy

## 2012-11-28 ENCOUNTER — Encounter: Payer: Self-pay | Admitting: Physical Medicine and Rehabilitation

## 2012-11-28 VITALS — BP 152/82 | HR 103 | Resp 14 | Ht 64.0 in | Wt 270.0 lb

## 2012-11-28 DIAGNOSIS — IMO0001 Reserved for inherently not codable concepts without codable children: Secondary | ICD-10-CM

## 2012-11-28 DIAGNOSIS — Z923 Personal history of irradiation: Secondary | ICD-10-CM | POA: Insufficient documentation

## 2012-11-28 DIAGNOSIS — R079 Chest pain, unspecified: Secondary | ICD-10-CM | POA: Insufficient documentation

## 2012-11-28 DIAGNOSIS — Z9221 Personal history of antineoplastic chemotherapy: Secondary | ICD-10-CM | POA: Insufficient documentation

## 2012-11-28 DIAGNOSIS — Z9010 Acquired absence of unspecified breast and nipple: Secondary | ICD-10-CM | POA: Insufficient documentation

## 2012-11-28 DIAGNOSIS — C50919 Malignant neoplasm of unspecified site of unspecified female breast: Secondary | ICD-10-CM | POA: Insufficient documentation

## 2012-11-28 DIAGNOSIS — I10 Essential (primary) hypertension: Secondary | ICD-10-CM | POA: Insufficient documentation

## 2012-11-28 DIAGNOSIS — M25519 Pain in unspecified shoulder: Secondary | ICD-10-CM | POA: Insufficient documentation

## 2012-11-28 DIAGNOSIS — G893 Neoplasm related pain (acute) (chronic): Secondary | ICD-10-CM

## 2012-11-28 DIAGNOSIS — M62838 Other muscle spasm: Secondary | ICD-10-CM | POA: Insufficient documentation

## 2012-11-28 NOTE — Patient Instructions (Addendum)
Continue with your exercise program, try to exercise in the pool.

## 2012-11-28 NOTE — Progress Notes (Signed)
Subjective:    Patient ID: Karen Hopkins, female    DOB: 11-23-1975, 37 y.o.   MRN: [id number]  HPI The patient complains about right shoulder pain and right sided chest pain. She has a history of a total mastectomy on the right in September 2011 and subsequently chemotherapy and radiation treatment. Her symptoms started shortly after she received those treatments. The patient  reports that she does  get sufficient relief from her current medications most of the time. She states that moving her right arm aggravates the pain and holding her arms still to alleviates the symptoms. Patient reports that the prescribed medication, Mobic, Flexeril and Robaxin made her feel sick, therefore she discontinued these medications. She has finished PT . She states that she has done some exercises and stretches for her right shoulder on her own.She states, that as a mother of 3 small children she has to be very active, and the pain interferes with her responsibilities, but she reports that the hydrocodone  is helping with her functioning.   Pain Inventory Average Pain 8 Pain Right Now 7 My pain is intermittent, sharp, burning, dull, stabbing, tingling and aching  In the last 24 hours, has pain interfered with the following? General activity 8 Relation with others 8 Enjoyment of life 8 What TIME of day is your pain at its worst? constant Sleep (in general) Poor  Pain is worse with: some activites Pain improves with: medication Relief from Meds: 4  Mobility walk without assistance how many minutes can you walk? 15 ability to climb steps?  yes do you drive?  yes Do you have any goals in this area?  yes  Function disabled: date disabled . Do you have any goals in this area?  yes  Neuro/Psych numbness tremor tingling spasms confusion depression anxiety  Prior Studies Any changes since last visit?  no  Physicians involved in your care Any changes since last visit?  no   Family History   Problem Relation Age of Onset  . Hypertension Mother   . Diabetes type II Father   . Prostate cancer Father    History   Social History  . Marital Status: Single    Spouse Name: N/A    Number of Children: N/A  . Years of Education: N/A   Social History Main Topics  . Smoking status: Former Smoker    Types: Cigarettes    Quit date: 04/26/2010  . Smokeless tobacco: Never Used  . Alcohol Use: No  . Drug Use: None  . Sexually Active: None   Other Topics Concern  . None   Social History Narrative  . None   Past Surgical History  Procedure Laterality Date  . Mastectomy  right sided complete  . Hernia repair  2006  . Nasal septum surgery     Past Medical History  Diagnosis Date  . Breast cancer stage 3    radiation/chemo/ right mastectomy  . Seasonal allergies   . Hypertension    BP 152/82  Pulse 103  Resp 14  Ht 5\' 4"  (1.626 m)  Wt 270 lb (122.471 kg)  BMI 46.32 kg/m2  SpO2 96%     Review of Systems  Gastrointestinal: Positive for constipation.  Neurological: Positive for tremors and numbness.  Psychiatric/Behavioral: Positive for confusion and dysphoric mood. The patient is nervous/anxious.   All other systems reviewed and are negative.       Objective:   Physical Exam Constitutional: She is oriented to [place], place, and time.  She appears well-developed.  HENT:  Head: Normocephalic.  Eyes: Pupils are equal, round, and reactive to light.  Neck: Normal range of motion.  Neurological: She is alert and oriented to [place], place, and time.  Skin: Skin is warm and dry.  Psychiatric: She has a normal mood and affect.  Contracture of pectoralis major and minor, scar tissue, external rotation with abduction of right shoulder almost full ROM but mild pain. Otherwise strength grossly normal in 4 extremities.        Assessment & Plan:  37 year old female, status post total mastectomy on the right in September 2011, and subsequentially chemotherapy and  radiation therapy, after which she developed right shoulder and right chest pain with contractures of the pectoralis major and minor muscle.  The patient should continue with her current medication.Patient can not switch her Celexa to Cymbalta, because she is on Tamoxifen. She should continue with the exercises she learned at physical therapy .I also advised her to do some aquatic exercises, which would be beneficial.  Prescribed Lyrica in the past for her radiating pain, which she did not tolerate. Prescribed Flexeril in the past for her muscle spasms, which did not help much, patient d/ced Flexeril.  Refilled Hydrocodone 5mg  #60, to improve her functioning, Patient was on this medication before, prescribed by her oncologist , she tolerated the medication well, and is now better functioning. She is the mother of 3 and states, that her pain sometimes interferes with her daily responsibilities.  She should continue using the TENS unit, which gives her relief . Recommended that patient talks to her oncologist about her weight gain since being on the Tamoxifen, maybe he can refer her to a nutritionist who is specialized in cancer patients.   Patient should follow up in 3 month.

## 2012-11-30 ENCOUNTER — Other Ambulatory Visit: Payer: Self-pay | Admitting: *Deleted

## 2012-11-30 DIAGNOSIS — C50919 Malignant neoplasm of unspecified site of unspecified female breast: Secondary | ICD-10-CM

## 2012-11-30 MED ORDER — ALPRAZOLAM ER 1 MG PO TB24: 1.0000 mg | ORAL_TABLET | Freq: Every day | ORAL | Status: DC

## 2012-12-01 ENCOUNTER — Encounter: Payer: No Typology Code available for payment source | Admitting: Physical Therapy

## 2012-12-04 ENCOUNTER — Ambulatory Visit: Payer: Medicare Other | Admitting: Physical Therapy

## 2012-12-15 ENCOUNTER — Telehealth: Payer: Self-pay | Admitting: *Deleted

## 2012-12-15 MED ORDER — HYDROCODONE-ACETAMINOPHEN 5-325 MG PO TABS: 1.0000 | ORAL_TABLET | Freq: Three times a day (TID) | ORAL | Status: DC | PRN

## 2012-12-15 NOTE — Telephone Encounter (Signed)
Refill hydrocodone. Done and Julienne notified.

## 2012-12-18 ENCOUNTER — Other Ambulatory Visit: Payer: Medicare Other | Admitting: Lab

## 2012-12-25 ENCOUNTER — Encounter: Payer: Self-pay | Admitting: Physician Assistant

## 2012-12-25 ENCOUNTER — Ambulatory Visit: Payer: Medicare Other | Admitting: Physician Assistant

## 2012-12-25 NOTE — Progress Notes (Signed)
[facility] today.  Letter mailed to patient.   Zollie Scale, PA-C 12/25/2012

## 2012-12-28 ENCOUNTER — Other Ambulatory Visit: Payer: Self-pay | Admitting: *Deleted

## 2012-12-28 DIAGNOSIS — C50919 Malignant neoplasm of unspecified site of unspecified female breast: Secondary | ICD-10-CM

## 2012-12-28 DIAGNOSIS — R51 Headache: Secondary | ICD-10-CM

## 2012-12-28 MED ORDER — TRIAMTERENE-HCTZ 37.5-25 MG PO TABS: 1.0000 | ORAL_TABLET | Freq: Every day | ORAL | Status: DC

## 2012-12-28 MED ORDER — TRAMADOL HCL 50 MG PO TABS: 50.0000 mg | ORAL_TABLET | Freq: Four times a day (QID) | ORAL | Status: DC | PRN

## 2012-12-28 MED ORDER — ALPRAZOLAM ER 1 MG PO TB24: 1.0000 mg | ORAL_TABLET | Freq: Every day | ORAL | Status: DC

## 2012-12-28 NOTE — Telephone Encounter (Signed)
TRAMADOL WAS CALLED TO PHARMACY.

## 2013-01-05 ENCOUNTER — Other Ambulatory Visit: Payer: Self-pay | Admitting: *Deleted

## 2013-01-05 ENCOUNTER — Other Ambulatory Visit: Payer: Self-pay | Admitting: Physician Assistant

## 2013-01-05 DIAGNOSIS — C50919 Malignant neoplasm of unspecified site of unspecified female breast: Secondary | ICD-10-CM

## 2013-01-08 ENCOUNTER — Other Ambulatory Visit: Payer: Self-pay | Admitting: *Deleted

## 2013-01-08 MED ORDER — NAPROXEN 500 MG PO TABS: 500.0000 mg | ORAL_TABLET | Freq: Three times a day (TID) | ORAL | Status: DC

## 2013-01-09 ENCOUNTER — Telehealth: Payer: Self-pay | Admitting: Oncology

## 2013-01-11 ENCOUNTER — Other Ambulatory Visit: Payer: Self-pay

## 2013-01-11 MED ORDER — HYDROCODONE-ACETAMINOPHEN 5-325 MG PO TABS: 1.0000 | ORAL_TABLET | Freq: Three times a day (TID) | ORAL | Status: DC | PRN

## 2013-01-12 ENCOUNTER — Encounter: Payer: Self-pay | Admitting: Family Medicine

## 2013-01-12 ENCOUNTER — Ambulatory Visit (INDEPENDENT_AMBULATORY_CARE_PROVIDER_SITE_OTHER): Payer: Medicare Other | Admitting: Family Medicine

## 2013-01-12 VITALS — BP 130/86 | HR 94 | Temp 98.1°F | Ht 64.0 in | Wt 262.0 lb

## 2013-01-12 DIAGNOSIS — I10 Essential (primary) hypertension: Secondary | ICD-10-CM

## 2013-01-12 NOTE — Patient Instructions (Addendum)
It was nice to meet you today!  For the sinus pressure, try saline nasal spray or a neti pot. You can take tylenol while you are out of the hydrocodone, but don't take them together because the hydrocodone has tylenol in it. Come back if you're not better in a few days.  I will call you if your test results are not normal.  Otherwise, I will send you a letter.  If you do not hear from me with in 2 weeks please call our office.      Come back in 3 months to follow up on weight loss. Call if you have any problems.  Be well, Dr. Pollie Meyer    Preventive Care for Adults, Female A healthy lifestyle and preventive care can promote health and wellness. Preventive health guidelines for women include the following key practices.  A routine yearly physical is a good way to check with your caregiver about your health and preventive screening. It is a chance to share any concerns and updates on your health, and to receive a thorough exam.  Visit your dentist for a routine exam and preventive care every 6 months. Brush your teeth twice a day and floss once a day. Good oral hygiene prevents tooth decay and gum disease.  The frequency of eye exams is based on your age, health, family medical history, use of contact lenses, and other factors. Follow your caregiver's recommendations for frequency of eye exams.  Eat a healthy diet. Foods like vegetables, fruits, whole grains, low-fat dairy products, and lean protein foods contain the nutrients you need without too many calories. Decrease your intake of foods high in solid fats, added sugars, and salt. Eat the right amount of calories for you.Get information about a proper diet from your caregiver, if necessary.  Regular physical exercise is one of the most important things you can do for your health. Most adults should get at least 150 minutes of moderate-intensity exercise (any activity that increases your heart rate and causes you to sweat) each week. In  addition, most adults need muscle-strengthening exercises on 2 or more days a week.  Maintain a healthy weight. The body mass index (BMI) is a screening tool to identify possible weight problems. It provides an estimate of body fat based on height and weight. Your caregiver can help determine your BMI, and can help you achieve or maintain a healthy weight.For adults 20 years and older:  A BMI below 18.5 is considered underweight.  A BMI of 18.5 to 24.9 is normal.  A BMI of 25 to 29.9 is considered overweight.  A BMI of 30 and above is considered obese.  Maintain normal blood lipids and cholesterol levels by exercising and minimizing your intake of saturated fat. Eat a balanced diet with plenty of fruit and vegetables. Blood tests for lipids and cholesterol should begin at age 54 and be repeated every 5 years. If your lipid or cholesterol levels are high, you are over 50, or you are at high risk for heart disease, you may need your cholesterol levels checked more frequently.Ongoing high lipid and cholesterol levels should be treated with medicines if diet and exercise are not effective.  If you smoke, find out from your caregiver how to quit. If you do not use tobacco, do not start.  If you are pregnant, do not drink alcohol. If you are breastfeeding, be very cautious about drinking alcohol. If you are not pregnant and choose to drink alcohol, do not exceed 1 drink  per day. One drink is considered to be 12 ounces (355 mL) of beer, 5 ounces (148 mL) of wine, or 1.5 ounces (44 mL) of liquor.  Avoid use of street drugs. Do not share needles with anyone. Ask for help if you need support or instructions about stopping the use of drugs.  High blood pressure causes heart disease and increases the risk of stroke. Your blood pressure should be checked at least every 1 to 2 years. Ongoing high blood pressure should be treated with medicines if weight loss and exercise are not effective.  If you are 25  to 35 years old, ask your caregiver if you should take aspirin to prevent strokes.  Diabetes screening involves taking a blood sample to check your fasting blood sugar level. This should be done once every 3 years, after age 87, if you are within normal weight and without risk factors for diabetes. Testing should be considered at a younger age or be carried out more frequently if you are overweight and have at least 1 risk factor for diabetes.  Breast cancer screening is essential preventive care for women. You should practice "breast self-awareness." This means understanding the normal appearance and feel of your breasts and may include breast self-examination. Any changes detected, no matter how small, should be reported to a caregiver. Women in their 39s and 30s should have a clinical breast exam (CBE) by a caregiver as part of a regular health exam every 1 to 3 years. After age 30, women should have a CBE every year. Starting at age 76, women should consider having a mammography (breast X-ray test) every year. Women who have a family history of breast cancer should talk to their caregiver about genetic screening. Women at a high risk of breast cancer should talk to their caregivers about having magnetic resonance imaging (MRI) and a mammography every year.  The Pap test is a screening test for cervical cancer. A Pap test can show cell changes on the cervix that might become cervical cancer if left untreated. A Pap test is a procedure in which cells are obtained and examined from the lower end of the uterus (cervix).  Women should have a Pap test starting at age 62.  Between ages 37 and 9, Pap tests should be repeated every 2 years.  Beginning at age 87, you should have a Pap test every 3 years as long as the past 3 Pap tests have been normal.  Some women have medical problems that increase the chance of getting cervical cancer. Talk to your caregiver about these problems. It is especially  important to talk to your caregiver if a new problem develops soon after your last Pap test. In these cases, your caregiver may recommend more frequent screening and Pap tests.  The above recommendations are the same for women who have or have not gotten the vaccine for human papillomavirus (HPV).  If you had a hysterectomy for a problem that was not cancer or a condition that could lead to cancer, then you no longer need Pap tests. Even if you no longer need a Pap test, a regular exam is a good idea to make sure no other problems are starting.  If you are between ages 31 and 54, and you have had normal Pap tests going back 10 years, you no longer need Pap tests. Even if you no longer need a Pap test, a regular exam is a good idea to make sure no other problems are starting.  If you have had past treatment for cervical cancer or a condition that could lead to cancer, you need Pap tests and screening for cancer for at least 20 years after your treatment.  If Pap tests have been discontinued, risk factors (such as a new sexual partner) need to be reassessed to determine if screening should be resumed.  The HPV test is an additional test that may be used for cervical cancer screening. The HPV test looks for the virus that can cause the cell changes on the cervix. The cells collected during the Pap test can be tested for HPV. The HPV test could be used to screen women aged 32 years and older, and should be used in women of any age who have unclear Pap test results. After the age of 33, women should have HPV testing at the same frequency as a Pap test.  Colorectal cancer can be detected and often prevented. Most routine colorectal cancer screening begins at the age of 70 and continues through age 71. However, your caregiver may recommend screening at an earlier age if you have risk factors for colon cancer. On a yearly basis, your caregiver may provide home test kits to check for hidden blood in the stool.  Use of a small camera at the end of a tube, to directly examine the colon (sigmoidoscopy or colonoscopy), can detect the earliest forms of colorectal cancer. Talk to your caregiver about this at age 32, when routine screening begins. Direct examination of the colon should be repeated every 5 to 10 years through age 39, unless early forms of pre-cancerous polyps or small growths are found.  Hepatitis C blood testing is recommended for all people born from [birth year] through 1965 and any individual with known risks for hepatitis C.  Practice safe sex. Use condoms and avoid high-risk sexual practices to reduce the spread of sexually transmitted infections (STIs). STIs include gonorrhea, chlamydia, syphilis, trichomonas, herpes, HPV, and human immunodeficiency virus (HIV). Herpes, HIV, and HPV are viral illnesses that have no cure. They can result in disability, cancer, and death. Sexually active women aged 87 and younger should be checked for chlamydia. Older women with new or multiple partners should also be tested for chlamydia. Testing for other STIs is recommended if you are sexually active and at increased risk.  Osteoporosis is a disease in which the bones lose minerals and strength with aging. This can result in serious bone fractures. The risk of osteoporosis can be identified using a bone density scan. Women ages 45 and over and women at risk for fractures or osteoporosis should discuss screening with their caregivers. Ask your caregiver whether you should take a calcium supplement or vitamin D to reduce the rate of osteoporosis.  Menopause can be associated with physical symptoms and risks. Hormone replacement therapy is available to decrease symptoms and risks. You should talk to your caregiver about whether hormone replacement therapy is right for you.  Use sunscreen with sun protection factor (SPF) of 30 or more. Apply sunscreen liberally and repeatedly throughout the day. You should seek shade when  your shadow is shorter than you. Protect yourself by wearing long sleeves, pants, a wide-brimmed hat, and sunglasses year round, whenever you are outdoors.  Once a month, do a whole body skin exam, using a mirror to look at the skin on your back. Notify your caregiver of new moles, moles that have irregular borders, moles that are larger than a pencil eraser, or moles that have changed in shape  or color.  Stay current with required immunizations.  Influenza. You need a dose every fall (or winter). The composition of the flu vaccine changes each year, so being vaccinated once is not enough.  Pneumococcal polysaccharide. You need 1 to 2 doses if you smoke cigarettes or if you have certain chronic medical conditions. You need 1 dose at age 71 (or older) if you have never been vaccinated.  Tetanus, diphtheria, pertussis (Tdap, Td). Get 1 dose of Tdap vaccine if you are younger than age 70, are over 92 and have contact with an infant, are a Research scientist (physical sciences), are pregnant, or simply want to be protected from whooping cough. After that, you need a Td booster dose every 10 years. Consult your caregiver if you have not had at least 3 tetanus and diphtheria-containing shots sometime in your life or have a deep or dirty wound.  HPV. You need this vaccine if you are a woman age 52 or younger. The vaccine is given in 3 doses over 6 months.  Measles, mumps, rubella (MMR). You need at least 1 dose of MMR if you were born in 1957 or later. You may also need a second dose.  Meningococcal. If you are age 77 to 11 and a first-year college student living in a residence hall, or have one of several medical conditions, you need to get vaccinated against meningococcal disease. You may also need additional booster doses.  Zoster (shingles). If you are age 1 or older, you should get this vaccine.  Varicella (chickenpox). If you have never had chickenpox or you were vaccinated but received only 1 dose, talk to your  caregiver to find out if you need this vaccine.  Hepatitis A. You need this vaccine if you have a specific risk factor for hepatitis A virus infection or you simply wish to be protected from this disease. The vaccine is usually given as 2 doses, 6 to 18 months apart.  Hepatitis B. You need this vaccine if you have a specific risk factor for hepatitis B virus infection or you simply wish to be protected from this disease. The vaccine is given in 3 doses, usually over 6 months. Preventive Services / Frequency Ages 4 to 12  Blood pressure check.** / Every 1 to 2 years.  Lipid and cholesterol check.** / Every 5 years beginning at age 73.  Clinical breast exam.** / Every 3 years for women in their 19s and 30s.  Pap test.** / Every 2 years from ages 21 through 78. Every 3 years starting at age 18 through age 61 or 34 with a history of 3 consecutive normal Pap tests.  HPV screening.** / Every 3 years from ages 48 through ages 44 to 58 with a history of 3 consecutive normal Pap tests.  Hepatitis C blood test.** / For any individual with known risks for hepatitis C.  Skin self-exam. / Monthly.  Influenza immunization.** / Every year.  Pneumococcal polysaccharide immunization.** / 1 to 2 doses if you smoke cigarettes or if you have certain chronic medical conditions.  Tetanus, diphtheria, pertussis (Tdap, Td) immunization. / A one-time dose of Tdap vaccine. After that, you need a Td booster dose every 10 years.  HPV immunization. / 3 doses over 6 months, if you are 60 and younger.  Measles, mumps, rubella (MMR) immunization. / You need at least 1 dose of MMR if you were born in 1957 or later. You may also need a second dose.  Meningococcal immunization. / 1 dose if you  are age 37 to 33 and a first-year college student living in a residence hall, or have one of several medical conditions, you need to get vaccinated against meningococcal disease. You may also need additional booster  doses.  Varicella immunization.** / Consult your caregiver.  Hepatitis A immunization.** / Consult your caregiver. 2 doses, 6 to 18 months apart.  Hepatitis B immunization.** / Consult your caregiver. 3 doses usually over 6 months. Ages 32 to 66  Blood pressure check.** / Every 1 to 2 years.  Lipid and cholesterol check.** / Every 5 years beginning at age 82.  Clinical breast exam.** / Every year after age 47.  Mammogram.** / Every year beginning at age 30 and continuing for as long as you are in good health. Consult with your caregiver.  Pap test.** / Every 3 years starting at age 5 through age 53 or 18 with a history of 3 consecutive normal Pap tests.  HPV screening.** / Every 3 years from ages 42 through ages 9 to 27 with a history of 3 consecutive normal Pap tests.  Fecal occult blood test (FOBT) of stool. / Every year beginning at age 53 and continuing until age 49. You may not need to do this test if you get a colonoscopy every 10 years.  Flexible sigmoidoscopy or colonoscopy.** / Every 5 years for a flexible sigmoidoscopy or every 10 years for a colonoscopy beginning at age 29 and continuing until age 9.  Hepatitis C blood test.** / For all people born from [birth year] through 1963 and any individual with known risks for hepatitis C.  Skin self-exam. / Monthly.  Influenza immunization.** / Every year.  Pneumococcal polysaccharide immunization.** / 1 to 2 doses if you smoke cigarettes or if you have certain chronic medical conditions.  Tetanus, diphtheria, pertussis (Tdap, Td) immunization.** / A one-time dose of Tdap vaccine. After that, you need a Td booster dose every 10 years.  Measles, mumps, rubella (MMR) immunization. / You need at least 1 dose of MMR if you were born in 1957 or later. You may also need a second dose.  Varicella immunization.** / Consult your caregiver.  Meningococcal immunization.** / Consult your caregiver.  Hepatitis A immunization.** / Consult  your caregiver. 2 doses, 6 to 18 months apart.  Hepatitis B immunization.** / Consult your caregiver. 3 doses, usually over 6 months. Ages 34 and over  Blood pressure check.** / Every 1 to 2 years.  Lipid and cholesterol check.** / Every 5 years beginning at age 32.  Clinical breast exam.** / Every year after age 62.  Mammogram.** / Every year beginning at age 95 and continuing for as long as you are in good health. Consult with your caregiver.  Pap test.** / Every 3 years starting at age 42 through age 46 or 81 with a 3 consecutive normal Pap tests. Testing can be stopped between 65 and 70 with 3 consecutive normal Pap tests and no abnormal Pap or HPV tests in the past 10 years.  HPV screening.** / Every 3 years from ages 80 through ages 24 or 53 with a history of 3 consecutive normal Pap tests. Testing can be stopped between 65 and 70 with 3 consecutive normal Pap tests and no abnormal Pap or HPV tests in the past 10 years.  Fecal occult blood test (FOBT) of stool. / Every year beginning at age 87 and continuing until age 46. You may not need to do this test if you get a colonoscopy every 10 years.  Flexible sigmoidoscopy or colonoscopy.** / Every 5 years for a flexible sigmoidoscopy or every 10 years for a colonoscopy beginning at age 8 and continuing until age 19.  Hepatitis C blood test.** / For all people born from [birth year] through 1965 and any individual with known risks for hepatitis C.  Osteoporosis screening.** / A one-time screening for women ages 85 and over and women at risk for fractures or osteoporosis.  Skin self-exam. / Monthly.  Influenza immunization.** / Every year.  Pneumococcal polysaccharide immunization.** / 1 dose at age 52 (or older) if you have never been vaccinated.  Tetanus, diphtheria, pertussis (Tdap, Td) immunization. / A one-time dose of Tdap vaccine if you are over 65 and have contact with an infant, are a Research scientist (physical sciences), or simply want to be protected  from whooping cough. After that, you need a Td booster dose every 10 years.  Varicella immunization.** / Consult your caregiver.  Meningococcal immunization.** / Consult your caregiver.  Hepatitis A immunization.** / Consult your caregiver. 2 doses, 6 to 18 months apart.  Hepatitis B immunization.** / Check with your caregiver. 3 doses, usually over 6 months. ** Family history and personal history of risk and conditions may change your caregiver's recommendations. Document Released: 07/20/2001 Document Revised: 08/16/2011 Document Reviewed: 10/19/2010 [facility] Patient Information 2014 [place], [place].

## 2013-01-12 NOTE — Progress Notes (Addendum)
Patient ID: Karen Hopkins, female   DOB: April 17, 1976, 37 y.o.   MRN: [id number]   HPI:  Well woman checkup: Patient presents today for a well woman exam. Reports that she has had a period every now and then, but is on tamoxifen therapy which has messed with her periods. She did have a period in July and it was heavy. She is working on weight loss. Gained a lot of weight with her cancer treatments.  Sinus problems: she has been having some sinus problems for the last two days. Feels sinus pressure in her face and has headache. No fevers.   ROS: See HPI  PMFSH: Hx of depression, currently under treatment by Amy at the [facility]. Denies SI/HI. Hx of breast cancer, currently in remission. Drinks monthly or less, has 1-2 drinks at a time when she does drink. Never has 6 or more drinks on one occasion. Feels safe in relationships. ROS sheet positive for: cough, constipation, muscle cramps/aches, joint pain/swelling, headaches, sadness, anxiety, stress  PHYSICAL EXAM: BP 130/86  Pulse 94  Temp(Src) 98.1 F (36.7 C) (Oral)  Ht 5\' 4"  (1.626 m)  Wt 262 lb (118.842 kg)  BMI 44.95 kg/m2 Gen: NAD HEENT: TM's clear bilaterally, oropharynx clear, PERRL. Some pain with palpation of frontal sinus. Heart: RRR Lungs: CTAB Abd: soft, NTND Neuro: grossly nonfocal, speech intact   ASSESSMENT/PLAN:  # Health maintenance:  -pap not done today as patient's last pap smear was April 2012 and was normal, so by normal screening guidelines she does not need one today. I am unable to determine from a literature search if she needs them more frequently than we would for someone not on tamoxifen. Will plan to message her oncology provider, and if it turns out that she does need paps more frequently, will contact patient to return for pap. -will check fasting lipid panel today for routine cholesterol screening as patient is 37. -f/u in 3 mo to see how weight loss is going  # Sinus pressure: -recommended neti  pot/saline wash. Has not had symptoms long enough to warrant antibiotic treatment for sinusitis. F/u if not improving.

## 2013-01-13 LAB — LIPID PANEL
Cholesterol: 96 mg/dL (ref 0–200)
Triglycerides: 102 mg/dL (ref <150)

## 2013-01-17 ENCOUNTER — Ambulatory Visit (HOSPITAL_BASED_OUTPATIENT_CLINIC_OR_DEPARTMENT_OTHER): Payer: Medicare Other | Admitting: Physician Assistant

## 2013-01-17 ENCOUNTER — Telehealth: Payer: Self-pay | Admitting: Oncology

## 2013-01-17 ENCOUNTER — Other Ambulatory Visit (HOSPITAL_BASED_OUTPATIENT_CLINIC_OR_DEPARTMENT_OTHER): Payer: Medicare Other | Admitting: Lab

## 2013-01-17 ENCOUNTER — Encounter: Payer: Self-pay | Admitting: Physician Assistant

## 2013-01-17 ENCOUNTER — Telehealth: Payer: Self-pay

## 2013-01-17 ENCOUNTER — Ambulatory Visit (HOSPITAL_COMMUNITY)
Admission: RE | Admit: 2013-01-17 | Discharge: 2013-01-17 | Disposition: A | Discharge status: Home / Self Care | Payer: Medicare Other | Source: Ambulatory Visit | Attending: Physician Assistant | Admitting: Physician Assistant

## 2013-01-17 VITALS — BP 121/74 | HR 108 | Temp 98.6°F | Resp 2 | Ht 64.0 in | Wt 267.2 lb

## 2013-01-17 DIAGNOSIS — C50419 Malignant neoplasm of upper-outer quadrant of unspecified female breast: Secondary | ICD-10-CM

## 2013-01-17 DIAGNOSIS — N926 Irregular menstruation, unspecified: Secondary | ICD-10-CM

## 2013-01-17 DIAGNOSIS — C50919 Malignant neoplasm of unspecified site of unspecified female breast: Secondary | ICD-10-CM | POA: Insufficient documentation

## 2013-01-17 DIAGNOSIS — J069 Acute upper respiratory infection, unspecified: Secondary | ICD-10-CM

## 2013-01-17 DIAGNOSIS — G8929 Other chronic pain: Secondary | ICD-10-CM

## 2013-01-17 DIAGNOSIS — C773 Secondary and unspecified malignant neoplasm of axilla and upper limb lymph nodes: Secondary | ICD-10-CM

## 2013-01-17 DIAGNOSIS — Z9010 Acquired absence of unspecified breast and nipple: Secondary | ICD-10-CM | POA: Insufficient documentation

## 2013-01-17 DIAGNOSIS — R05 Cough: Secondary | ICD-10-CM

## 2013-01-17 DIAGNOSIS — Z170 Estrogen receptor positive status [ER+]: Secondary | ICD-10-CM

## 2013-01-17 DIAGNOSIS — J019 Acute sinusitis, unspecified: Secondary | ICD-10-CM

## 2013-01-17 DIAGNOSIS — R059 Cough, unspecified: Secondary | ICD-10-CM

## 2013-01-17 DIAGNOSIS — C50911 Malignant neoplasm of unspecified site of right female breast: Secondary | ICD-10-CM

## 2013-01-17 DIAGNOSIS — I890 Lymphedema, not elsewhere classified: Secondary | ICD-10-CM

## 2013-01-17 DIAGNOSIS — IMO0001 Reserved for inherently not codable concepts without codable children: Secondary | ICD-10-CM

## 2013-01-17 LAB — CBC WITH DIFFERENTIAL/PLATELET
Basophils Absolute: 0 10*3/uL (ref 0.0–0.1)
EOS%: 3.4 % (ref 0.0–7.0)
Eosinophils Absolute: 0.4 10*3/uL (ref 0.0–0.5)
HCT: 37.3 % (ref 34.8–46.6)
HGB: 12.7 g/dL (ref 11.6–15.9)
MCH: 29.9 pg (ref 25.1–34.0)
MCV: 87.7 fL (ref 79.5–101.0)
NEUT%: 69.4 % (ref 38.4–76.8)
lymph#: 2.4 10*3/uL (ref 0.9–3.3)

## 2013-01-17 LAB — COMPREHENSIVE METABOLIC PANEL (CC13)
AST: 10 U/L (ref 5–34)
BUN: 10.3 mg/dL (ref 7.0–26.0)
Calcium: 8.7 mg/dL (ref 8.4–10.4)
Chloride: 107 mEq/L (ref 98–109)
Creatinine: 0.7 mg/dL (ref 0.6–1.1)
Glucose: 177 mg/dl — ABNORMAL HIGH (ref 70–140)

## 2013-01-17 MED ORDER — CIPROFLOXACIN HCL 500 MG PO TABS: 500.0000 mg | ORAL_TABLET | Freq: Two times a day (BID) | ORAL | Status: DC

## 2013-01-17 MED ORDER — FLUTICASONE PROPIONATE 50 MCG/ACT NA SUSP: 2.0000 | Freq: Every day | NASAL | Status: DC

## 2013-01-17 NOTE — Telephone Encounter (Signed)
Spoke with pt regarding results of chest x ray. Per AB, results are ok. Pt voiced understanding and knows to call the office with any questions. TMB

## 2013-01-17 NOTE — Progress Notes (Signed)
ID: Karen Hopkins   DOB: 1975-10-07  MR#: [id number]  WGN#:[id number]  PCP: Levert Feinstein, MD GYN: SUCicero Duck OTHER MD: Claudette Laws  HISTORY OF PRESENT ILLNESS: The patient noted swelling and redness, as well as some tenderness, over her right breast for some time. She thought this might be related to pregnancy, since she delivered her third child about 8 months ago. As the problem did not improve, she brought it to her primary care physician's attention. He treated her with Bactrim, which she says she could not tolerate because of nausea and vomiting, but which in any case did not resolve the problem, so he set her up for mammography at [facility] 08/26/2009. Dr. Deboraha Sprang found by exam marked skin thickening and erythema of the right breast extending pretty much throughout the breast. By mammography there were scattered fibroglandular densities, and an asymmetric density in the right upper outer quadrant with suspicious microcalcifications. There were also enlarged right axillary lymph nodes. The left appeared normal. By ultrasound there was an ill defined area of hypoechoic tissue in the right upper outer quadrant that was difficult to measure. There were also abnormal right axillary lymph nodes.  Ultrasound guided biopsy was performed the same day, and showed [phone]) a poorly differentiated invasive ductal carcinoma, which was estrogen receptor poor at 3%, progesterone receptor positive at 14% with an elevated proliferation marker at 50%, but importantly with strong amplification of HER-2 by CISH with a ratio of 5.20.  Bilateral breast MRIs were obtained 09/02/2009. This showed an area up to 10.6 cm in the right breast showing confluent mass and non-mass enhancement. There was marked skin thickening involving the entire right breast, and numerous bulky right axillary lymph nodes were identified. The left breast was unremarkable, and there was no evidence of internal mammary  lymph node involvement.  Patient was treated in the neoadjuvant setting with 4 dose dense cycles of doxorubicin and cyclophosphamide, followed by 12 weekly doses of paclitaxel and trastuzumab. [name] underwent definitive right modified radical mastectomy in October 2011, with a residual 2.5 mm area of tumor in the breast, grade 3, and one of 21 lymph nodes involved. Her subsequent history is as detailed below  INTERVAL HISTORY: [name] returns today for followup of her right breast carcinoma, for which she continues on tamoxifen daily.   Catalia continues to be followed through Dr.  Wynn Banker office for chronic pain. She's also seen as needed at the lymphedema clinic, and continues to use her compression sleeve for chronic right upper extremity lymphedema.  Interval history is notable for [name] having developed an apparent upper respiratory infections/sinusitis. She's having "sinus headaches" and has developed a cough productive of yellow phlegm. This has been going on now for slightly more than 3 weeks. Today she feels "achy all over". She had one episode of emesis this morning. She has had no known fevers and denies any chills.  REVIEW OF SYSTEMS: [name]  denies any skin changes and has had no abnormal bruising or bleeding. She's still having irregular menstrual cycles, with her last menstrual period being in July, and lasting 6-7 days. (We had requested a pelvic exam and Pap through her primary care office, especially in light of the fact that she is on tamoxifen, but for some reason this was not done when she was seen last week.) She has occasional hot flashes. She's eating and drinking fairly well, although her appetite has been somewhat decreased recently. She has had no change in bowel or bladder  habits. She has shortness of breath with exertion which is stable.  She denies any chest pain or palpitations. She's had no dizziness or change in vision. She denies any new peripheral swelling, other than  the chronic right upper extremity lymphedema. She continues to have quite a bit of anxiety for which she continues on Xanax. She still planning on making an appointment [facility]    A detailed review of systems is otherwise stable and noncontributory.     PAST MEDICAL HISTORY: Past Medical History  Diagnosis Date  . Breast cancer stage 3    radiation/chemo/ right mastectomy  . Seasonal allergies   . Hypertension     PAST SURGICAL HISTORY: Past Surgical History  Procedure Laterality Date  . Mastectomy  right sided complete  . Hernia repair  2006  . Nasal septum surgery      FAMILY HISTORY Family History  Problem Relation Age of Onset  . Hypertension Mother   . Diabetes type II Father   . Prostate cancer Father   There is no history of breast or ovarian cancer in the immediate family, but of the patient's mother's mother's six sisters, two (the patient's great-aunts) had ovarian cancer.  GYNECOLOGIC HISTORY: The patient is GX, P3. First child was premature. Age at first delivery, 37 years old.   SOCIAL HISTORY: (Updated December 2013) She is currently disabled. Karen Hopkins's children are [name], and [name], aged 13, 8, and 3.. They are all boys, all at home. The patient attends a non-denominational church in [place], which she considers her home, but she is currently living in [place].   ADVANCED DIRECTIVES:  HEALTH MAINTENANCE: History  Substance Use Topics  . Smoking status: Former Smoker    Types: Cigarettes    Quit date: 04/26/2010  . Smokeless tobacco: Never Used  . Alcohol Use: No     Colonoscopy: Never  PAP: 2012  Bone density: Never  Lipid panel:  Allergies  Allergen Reactions  . Lyrica [Pregabalin]   . Meloxicam Nausea Only  . Penicillins     REACTION: Facial swelling  . Robaxin [Methocarbamol] Nausea Only    Current Outpatient Prescriptions  Medication Sig Dispense Refill  . albuterol (PROVENTIL HFA;VENTOLIN  HFA) 108 (90 BASE) MCG/ACT inhaler Inhale 2 puffs into the lungs every 6 (six) hours as needed for wheezing.  1 Inhaler  1  . ALPRAZolam (XANAX XR) 1 MG 24 hr tablet Take 1 tablet (1 mg total) by mouth daily.  30 tablet  3  . citalopram (CELEXA) 20 MG tablet TAKE ONE (1) TABLET BY MOUTH EVERY DAY  30 tablet  0  . diclofenac sodium (VOLTAREN) 1 % GEL Apply 2 g topically 4 (four) times daily.  2 Tube  2  . HYDROcodone-acetaminophen (NORCO) 5-325 MG per tablet Take 1 tablet by mouth every 8 (eight) hours as needed for pain.  60 tablet  0  . loratadine-pseudoephedrine (CLARITIN-D 24 HOUR) 10-240 MG per 24 hr tablet Take 1 tablet by mouth daily.  30 tablet  2  . naproxen (NAPROSYN) 500 MG tablet Take 1 tablet (500 mg total) by mouth 3 (three) times daily with meals.  90 tablet  5  . tamoxifen (NOLVADEX) 20 MG tablet Take 1 tablet (20 mg total) by mouth daily.  90 tablet  3  . traMADol (ULTRAM) 50 MG tablet Take 1 tablet (50 mg total) by mouth every 6 (six) hours as needed.  120 tablet  1  . triamterene-hydrochlorothiazide (MAXZIDE-25) 37.5-25 MG per  tablet Take 1 tablet by mouth daily.  30 tablet  3  . ciprofloxacin (CIPRO) 500 MG tablet Take 1 tablet (500 mg total) by mouth 2 (two) times daily.  20 tablet  0  . fluticasone (FLONASE) 50 MCG/ACT nasal spray Place 2 sprays into the nose daily.  16 g  1   No current facility-administered medications for this visit.    OBJECTIVE: Young African American woman who appears tired  Filed Vitals:   01/17/13 1100  BP: 121/74  Pulse: 108  Temp: 98.6 F (37 C)  Resp: 2     Body mass index is 45.84 kg/(m^2).    ECOG FS: 2   Filed Weights   01/17/13 1100  Weight: 267 lb 3.2 oz (121.201 kg)   Physical Exam: HEENT:  Sclerae anicteric.  Oropharynx clear. Frontal and maxillary sinuses are tender to gentle percussion Nodes:  No cervical or supraclavicular lymphadenopathy palpated.  Breast Exam: Patient status post right mastectomy with no skin changes and  no evidence of local recurrence. Chest wall is tender to palpation. Left breast is unremarkable. Axillae are benign bilaterally with no palpable adenopathy noted. Lungs:  Clear to auscultation bilaterally.  No crackles wheezes or rhonchi. Heart:  Regular rhythm.  Mildly tachycardic. No murmur appreciated. Abdomen:  Soft,  Obese,nontender.  Positive bowel sounds.  Musculoskeletal: Diffuse spinal tenderness to gentle palpation.  Extremities:  Lymphedema noted in the right upper extremity, with compression sleeve in place.  Neuro:  Nonfocal. Well oriented with anxious affect     LAB RESULTS: Lab Results  Component Value Date   WBC 10.5* 01/17/2013   NEUTROABS 7.3* 01/17/2013   HGB 12.7 01/17/2013   HCT 37.3 01/17/2013   MCV 87.7 01/17/2013   PLT 248 01/17/2013      Chemistry      Component Value Date/Time   NA 140 01/17/2013 1049   NA 138 11/24/2011 0903   K 3.4* 01/17/2013 1049   K 3.2* 11/24/2011 0903   CL 105 09/27/2012 1044   CL 103 11/24/2011 0903   CO2 23 01/17/2013 1049   CO2 27 11/24/2011 0903   BUN 10.3 01/17/2013 1049   BUN 13 11/24/2011 0903   CREATININE 0.7 01/17/2013 1049   CREATININE 0.58 11/24/2011 0903      Component Value Date/Time   CALCIUM 8.7 01/17/2013 1049   CALCIUM 9.1 11/24/2011 0903   ALKPHOS 54 01/17/2013 1049   ALKPHOS 65 11/24/2011 0903   AST 10 01/17/2013 1049   AST 13 11/24/2011 0903   ALT 10 01/17/2013 1049   ALT 13 11/24/2011 0903   BILITOT 0.32 01/17/2013 1049   BILITOT 0.1* 11/24/2011 0903       Lab Results  Component Value Date   LABCA2 22 05/25/2012     STUDIES: Left mammogram on 10/24/2012 was unremarkable  A chest x-ray is pending today.   ASSESSMENT: 37 year old BRCA1 and BRCA2 negative [place] woman, status post right breast biopsy in March 2011 for a high-grade invasive ductal carcinoma, ER positive 3%, PR positive 14%, strongly HER2/neu positive with MIB-1 of 15%. Biopsy proven lymph node involvement at presentation. Changes consistent  with inflammatory carcinoma.   1. Treated neoadjuvantly, status post 4 dose-dense cycles of doxorubicin/cyclophosphamide, followed by 12 weekly doses of paclitaxel with trastuzumab completed September 2011.   2. Trastuzumab was then continued for a total of 1 year [to June 2012]. Post-treatment echo showed a well-preserved ejection fraction.   3. Status post right modified radical mastectomy in October 2011 showing  a ypT1a ypN1 invasive ductal carcinoma, grade 3.   4. postmastectomy radiation completed in January 2012  5. on tamoxifen beginning January of 2012  6. other problems include chronic postsurgical pain and chronic right upper extremity lymphedema  PLAN: Aprill is doing well with regards to her breast cancer, and will continue on tamoxifen as before. I am referring her to the women's clinic at [facility] for a pelvic and Pap, the last Pap having been obtained in April 2012.    She's also developed an upper respiratory infection, perhaps an acute sinusitis, I am starting her on Cipro, 500 mg by mouth twice a day for 10 days. We're also going to obtain a chest x-ray today since her productive cough has continued for greater than 3 weeks. I prescribing Nasonex nasal spray as well.  Finally, Tacy would like to see a Engineer, petroleum for consideration of breast reconstruction, and I'm placing a referral to Dr. Wayland Denis accordingly.  I will plan on seeing Aubrei back in 3 months. If she is doing well at that time, we'll likely go to a q. 6 month followup schedule. Patient voices understanding and agreement with our plan, and will call with any changes or problems prior to her next scheduled appointment.  Rozalia Dino    01/17/2013

## 2013-01-30 ENCOUNTER — Encounter: Payer: Self-pay | Admitting: Obstetrics & Gynecology

## 2013-01-30 DIAGNOSIS — Z9010 Acquired absence of unspecified breast and nipple: Secondary | ICD-10-CM

## 2013-02-09 ENCOUNTER — Other Ambulatory Visit: Payer: Self-pay | Admitting: *Deleted

## 2013-02-09 DIAGNOSIS — C50919 Malignant neoplasm of unspecified site of unspecified female breast: Secondary | ICD-10-CM

## 2013-02-09 MED ORDER — CITALOPRAM HYDROBROMIDE 20 MG PO TABS: 20.0000 mg | ORAL_TABLET | Freq: Every day | ORAL | Status: DC

## 2013-02-12 ENCOUNTER — Other Ambulatory Visit: Payer: Self-pay

## 2013-02-12 MED ORDER — HYDROCODONE-ACETAMINOPHEN 5-325 MG PO TABS: 1.0000 | ORAL_TABLET | Freq: Three times a day (TID) | ORAL | Status: DC | PRN

## 2013-02-21 ENCOUNTER — Encounter
Payer: Medicare Other | Attending: Physical Medicine and Rehabilitation | Admitting: Physical Medicine and Rehabilitation

## 2013-02-21 ENCOUNTER — Encounter: Payer: Self-pay | Admitting: Physical Medicine and Rehabilitation

## 2013-02-21 VITALS — BP 143/72 | HR 117 | Resp 14 | Ht 64.0 in | Wt 266.0 lb

## 2013-02-21 DIAGNOSIS — IMO0001 Reserved for inherently not codable concepts without codable children: Secondary | ICD-10-CM

## 2013-02-21 DIAGNOSIS — M62838 Other muscle spasm: Secondary | ICD-10-CM | POA: Insufficient documentation

## 2013-02-21 DIAGNOSIS — C50919 Malignant neoplasm of unspecified site of unspecified female breast: Secondary | ICD-10-CM | POA: Insufficient documentation

## 2013-02-21 DIAGNOSIS — M25519 Pain in unspecified shoulder: Secondary | ICD-10-CM | POA: Insufficient documentation

## 2013-02-21 DIAGNOSIS — G893 Neoplasm related pain (acute) (chronic): Secondary | ICD-10-CM

## 2013-02-21 DIAGNOSIS — R0789 Other chest pain: Secondary | ICD-10-CM | POA: Insufficient documentation

## 2013-02-21 DIAGNOSIS — Z9010 Acquired absence of unspecified breast and nipple: Secondary | ICD-10-CM | POA: Insufficient documentation

## 2013-02-21 DIAGNOSIS — J45909 Unspecified asthma, uncomplicated: Secondary | ICD-10-CM | POA: Insufficient documentation

## 2013-02-21 DIAGNOSIS — Z79899 Other long term (current) drug therapy: Secondary | ICD-10-CM | POA: Insufficient documentation

## 2013-02-21 MED ORDER — DICLOFENAC SODIUM 1 % TD GEL: 2.0000 g | Freq: Four times a day (QID) | TRANSDERMAL | Status: DC

## 2013-02-21 MED ORDER — HYDROCODONE-ACETAMINOPHEN 5-325 MG PO TABS: 1.0000 | ORAL_TABLET | Freq: Three times a day (TID) | ORAL | Status: DC | PRN

## 2013-02-21 NOTE — Progress Notes (Signed)
Subjective:    Patient ID: Karen Hopkins, female    DOB: December 09, 1975, 37 y.o.   MRN: [id number]  HPI The patient complains about right shoulder pain and right sided chest pain. She has a history of a total mastectomy on the right in September 2011 and subsequently chemotherapy and radiation treatment. Her symptoms started shortly after she received those treatments. The patient reports that she does get sufficient relief from her current medications most of the time. She states that moving her right arm aggravates the pain and holding her arms still to alleviates the symptoms. Patient reports that the prescribed medication, Mobic, Flexeril and Robaxin made her feel sick, therefore she discontinued these medications. She has finished PT . She states that she has done some exercises and stretches for her right shoulder on her own.She states, that as a mother of 3 small children she has to be very active, and the pain interferes with her responsibilities, but she reports that the hydrocodone is helping with her functioning.   Pain Inventory Average Pain 7 Pain Right Now 8 My pain is sharp, burning, dull, stabbing, tingling and aching  In the last 24 hours, has pain interfered with the following? General activity 1 Relation with others 1 Enjoyment of life 1 What TIME of day is your pain at its worst? daytime, evening, night Sleep (in general) Poor  Pain is worse with: walking Pain improves with: medication Relief from Meds: 2  Mobility walk without assistance ability to climb steps?  no do you drive?  yes  Function disabled: date disabled na Do you have any goals in this area?  yes  Neuro/Psych numbness trouble walking spasms confusion depression anxiety  Prior Studies Any changes since last visit?  yes x-rays  Physicians involved in your care Any changes since last visit?  no   Family History  Problem Relation Age of Onset  . Hypertension Mother   . Diabetes type II  Father   . Prostate cancer Father    History   Social History  . Marital Status: Single    Spouse Name: N/A    Number of Children: N/A  . Years of Education: N/A   Social History Main Topics  . Smoking status: Former Smoker    Types: Cigarettes    Quit date: 04/26/2010  . Smokeless tobacco: Never Used  . Alcohol Use: No  . Drug Use: None  . Sexual Activity: None   Other Topics Concern  . None   Social History Narrative  . None   Past Surgical History  Procedure Laterality Date  . Mastectomy  right sided complete  . Hernia repair  2006  . Nasal septum surgery     Past Medical History  Diagnosis Date  . Breast cancer stage 3    radiation/chemo/ right mastectomy  . Seasonal allergies   . Hypertension    BP 143/72  Pulse 117  Resp 14  Ht 5\' 4"  (1.626 m)  Wt 266 lb (120.657 kg)  BMI 45.64 kg/m2  SpO2 97%  LMP 12/19/2012    Review of Systems  Constitutional: Positive for diaphoresis and unexpected weight change.  Respiratory: Positive for shortness of breath and wheezing.        Respiratory infection  Gastrointestinal: Positive for constipation.  Musculoskeletal: Positive for gait problem.  Neurological: Positive for numbness.       Spasms  Psychiatric/Behavioral: Positive for confusion and dysphoric mood. The patient is nervous/anxious.        Objective:  Physical Exam  Constitutional: She is oriented to [place], place, and time. She appears well-developed.  HENT:  Head: Normocephalic.  Eyes: Pupils are equal, round, and reactive to light.  Neck: Normal range of motion.  Neurological: She is alert and oriented to [place], place, and time.  Skin: Skin is warm and dry.  Psychiatric: She has a normal mood and affect.  Contracture of pectoralis major and minor, scar tissue, external rotation with abduction of right shoulder almost full ROM but mild pain.  Otherwise strength grossly normal in 4 extremities.       Assessment & Plan:  37 year old  female, status post total mastectomy on the right in September 2011, and subsequentially chemotherapy and radiation therapy, after which she developed right shoulder and right chest pain with contractures of the pectoralis major and minor muscle.  The patient should continue with her current medication.Patient can not switch her Celexa to Cymbalta, because she is on Tamoxifen. She should continue with the exercises she learned at physical therapy .I also advised her to get back into aquatic exercises, which were beneficial in the past.  Prescribed Lyrica in the past for her radiating pain, which she did not tolerate. Prescribed Flexeril in the past for her muscle spasms, which did not help much, patient d/ced Flexeril.  Refilled Hydrocodone 5mg  #60, to improve her functioning, Patient was on this medication before, prescribed by her oncologist , she tolerated the medication well, and is now better functioning. She is the mother of 3 and states, that her pain sometimes interferes with her daily responsibilities.  She has an exacerbation of her asthma, and thinks she has bronchitis, she will see her oncologist for this, the oncologist might prescribe her cough syrup, I told her that this would be ok, for a short time period, because she is on a very low dose of Hydrocodone, 5mg  bid. She should continue using the TENS unit, which gives her relief .  Recommended that patient talks to her oncologist about her weight gain since being on the Tamoxifen, maybe he can refer her to a nutritionist who is specialized in cancer patients.  Patient should follow up in 1 month.

## 2013-02-21 NOTE — Patient Instructions (Signed)
Continue with your exercise and walking program 

## 2013-02-22 ENCOUNTER — Encounter: Payer: Self-pay | Admitting: Family Medicine

## 2013-02-22 ENCOUNTER — Ambulatory Visit
Admission: RE | Admit: 2013-02-22 | Discharge: 2013-02-22 | Disposition: A | Discharge status: Home / Self Care | Payer: Medicare Other | Source: Ambulatory Visit | Attending: Family Medicine | Admitting: Family Medicine

## 2013-02-22 ENCOUNTER — Ambulatory Visit (INDEPENDENT_AMBULATORY_CARE_PROVIDER_SITE_OTHER): Payer: No Typology Code available for payment source | Admitting: Family Medicine

## 2013-02-22 ENCOUNTER — Encounter: Payer: Medicare Other | Admitting: Physical Medicine and Rehabilitation

## 2013-02-22 VITALS — BP 137/93 | HR 103 | Temp 98.6°F | Ht 64.0 in | Wt 263.0 lb

## 2013-02-22 DIAGNOSIS — R0602 Shortness of breath: Secondary | ICD-10-CM

## 2013-02-22 DIAGNOSIS — R062 Wheezing: Secondary | ICD-10-CM

## 2013-02-22 DIAGNOSIS — J209 Acute bronchitis, unspecified: Secondary | ICD-10-CM | POA: Insufficient documentation

## 2013-02-22 MED ORDER — ALBUTEROL SULFATE HFA 108 (90 BASE) MCG/ACT IN AERS: 2.0000 | INHALATION_SPRAY | Freq: Four times a day (QID) | RESPIRATORY_TRACT | Status: DC | PRN

## 2013-02-22 MED ORDER — PREDNISONE 50 MG PO TABS: 50.0000 mg | ORAL_TABLET | Freq: Every day | ORAL | Status: DC

## 2013-02-22 MED ORDER — AZITHROMYCIN 250 MG PO TABS: ORAL_TABLET | ORAL | Status: DC

## 2013-02-22 NOTE — Patient Instructions (Addendum)
It was nice to see you today.  I have prescribed Azithromycin, Prednisone and albuterol for your symptoms.   I am also getting a chest xray today to assess for underlying Pneumonia.   Please follow up with Dr. Pollie Meyer in the next 1-3 months.

## 2013-02-22 NOTE — Assessment & Plan Note (Addendum)
Symptoms consistent with acute bronchitis. Chest xray obtained to rule out underlying PNA.   Chest xray interpreted personally by me.  Right heart border noted to be slightly blurred suggesting RML infiltrate.  This was not indicated on the radiology report. Given Xray findings and symptoms, will treat empirically with Azithromycin.

## 2013-02-22 NOTE — Progress Notes (Signed)
Subjective:     Patient ID: Karen Hopkins, female   DOB: 1975-10-20, 37 y.o.   MRN: [id number]  HPI 37 year old female with history of breast cancer presents with complaints of wheezing/bronchitis.  1) Wheezing/Bronchitis - Patient reports frequent cough with sputum production and wheezing for approximately 1 week.  She also reports associated SOB.  - She has been using OTC remedies and frequent Albuterol (Q4) with no relief in her symptoms.  - No relieving factors reported. - She denies any recent sick contacts. - No associated fever, chills.   Review of Systems Per HPI    Objective:   Physical Exam Filed Vitals:   02/22/13 0933  BP: 137/93  Pulse: 103  Temp: 98.6 F (37 C)   Exam: General: well developed, well nourished in NAD.  Patient speaking in full sentences without difficulty.  HEENT: NCAT. No pharyngeal erythema or tonsillar exudate. Cardiovascular: RRR. No murmurs, rubs, or gallops. Respiratory: Prolonged expiratory phase with diffuse wheezing throughout.  Skin: Warm, dry, intact.      Assessment:      See problem list     Plan:

## 2013-02-22 NOTE — Assessment & Plan Note (Signed)
It is unclear whether this due to underlying bronchitis or if patient has asthma. I advised continued use of albuterol.  I am also giving 5 day prednisone burst given severity of wheezing and frequent albuterol use. Patient will need spirometry in the near future.

## 2013-03-01 ENCOUNTER — Other Ambulatory Visit: Payer: Self-pay | Admitting: Plastic Surgery

## 2013-03-01 ENCOUNTER — Encounter (HOSPITAL_COMMUNITY): Payer: Self-pay | Admitting: Pharmacy Technician

## 2013-03-01 NOTE — H&P (Signed)
Karen Hopkins  02/27/2013 9:15 AM   Office Visit  MRN:  [id number]  Department: Plastic Surgery  Dept Phone: [phone]  Description: Female DOB: 1976/04/11  Provider: Fanny Bien Rayburn, PA-C    Diagnoses    Acquired absence of breast and absent nipple, right    -  Primary   V45.71     Vitals - Last Recorded    146/85  89  98.7 F (37.1 C)  1.626 m (5\' 4" )  117.935 kg (260 lb)  44.61 kg/m2     Subjective:     Patient ID: Karen Hopkins is a 37 y.o. female.  HPI The patient is a 37 yrs old bf here for history and physical for right breast reconstruction with a latissimus and expander reconstruction.   History:  The patient had pain and skin thickening with erythema in the right breast in early 2011.  She was initially treated with Bactrim with the thought of infection due to her recent delivery of her child.  The antibiotic was not well tolerated and did not improve the condition so a mammogram was done.  The mammogram showed scattered fibroglandular densities, and an asymmetric density in the right upper outer quadrant with suspicious microcalcifications. There were also enlarged right axillary lymph nodes. The left was normal. The ultrasound showed an ill defined area of hypoechoic tissue in the right upper outer quadrant and abnormal right axillary lymph nodes. The ultrasound guided biopsy showed a poorly differentiated invasive ductal carcinoma, which was ER poor at 3%, PR positive at 14% with an elevated proliferation marker at 50%, with strong amplification of HER-2 by CISH with a ratio of 5.20.   Bilateral breast MRIs were obtained 09/02/2009 and showed an area 10.6 cm in the right breast showing confluent mass and non-mass enhancement. There was marked skin thickening involving the entire right breast, and numerous bulky right axillary lymph nodes were identified. The left breast was unremarkable, and there was no evidence of internal mammary lymph node involvement.   Patient was  treated in the neoadjuvant setting with 4 dose dense cycles of doxorubicin and cyclophosphamide, followed by paclitaxel and trastuzumab. She underwent definitive right modified radical mastectomy in 02/2010, with a residual 2.5 mm area of tumor in the breast, grade 3, and one of 21 lymph nodes involved. She finished radiation in 2/21012. She was treated for right arm lymphedema which has improved. The skin shows signs of radiation but not damaged. There is some hyperpigmentation. Her abdomen is very larger.  She had an abdominal hernia repair in the past. She has had issues with chronic pain following the mastectomy.   The following portions of the patient's history were reviewed and updated as appropriate: allergies, current medications, past family history, past medical history, past social history, past surgical history and problem list.  Review of Systems  Constitutional: Negative.   HENT: Negative.   Eyes: Negative.   Respiratory:        Recent episode of bronchitis treated with antibiotics and prednisone. Remote hx of asthma as a child  Cardiovascular:        Chronic right upper extremity lymphedema following axillary node resection  Gastrointestinal: Negative.   Endocrine: Negative.   Genitourinary: Negative.   Neurological: Negative.   Hematological: Negative.   Psychiatric/Behavioral: Negative.    Objective:    Physical Exam  Constitutional: She is oriented to [place], place, and time. She appears well-developed and well-nourished. No distress.  Centrally obese female  HENT:  Head: Normocephalic and atraumatic.   Nose: Nose normal.   Mouth/Throat: Oropharynx is clear and moist.  Eyes: Conjunctivae and EOM are normal. Pupils are equal, round, and reactive to light.  Neck: Normal range of motion. Neck supple. No JVD present. No tracheal deviation present.  Cardiovascular: Normal rate, regular rhythm and intact distal pulses.  Exam reveals no gallop and no friction rub.    No  murmur heard. Pulmonary/Chest: Effort normal and breath sounds normal. No stridor. No respiratory distress. She has no wheezes. She has no rales. She exhibits no tenderness.  Healed right mastectomy scar and surrounding thickening and darkening of right breast skin following radiation  Abdominal: Soft. Bowel sounds are normal. She exhibits no distension and no mass. There is no tenderness.  Musculoskeletal: Normal range of motion. She exhibits edema (chronic lymphedema of right upper extremity). She exhibits no tenderness.  Lymphadenopathy:    She has no cervical adenopathy.  Neurological: She is alert and oriented to [place], place, and time. No cranial nerve deficit. She exhibits normal muscle tone.  Skin: Skin is warm and dry. No rash noted. No erythema.  Psychiatric: She has a normal mood and affect. Her behavior is normal. Judgment and thought content normal.    Assessment:   1.  Acquired absence of breast and absent nipple, right     Plan:   Right breast latissimus with expander placement. The procedure, possible risks, benefits and complications were discussed with the patient and she desires to proceed and consent was obtained.      Medications Ordered This Encounter     HYDROcodone-acetaminophen (NORCO) 5-325 mg per tablet Take 1 tablet by mouth every 6 (six) hours as needed for Pain.    diazepam (VALIUM) 2 MG tabletTake 1 tablet (2 mg total) by mouth every 6 (six) hours as needed for up to 10 days for Anxiety.    promethazine (PHENERGAN) 12.5 MG tablet Take 1 tablet (12.5 mg total) by mouth every 6 (six) hours as needed for up to 7 days for Nausea.    cephalexin (KEFLEX) 500 MG capsule Take 1 capsule (500 mg total) by mouth 4 times daily.

## 2013-03-03 NOTE — Pre-Procedure Instructions (Signed)
Karen Hopkins  03/03/2013   Your procedure is scheduled on:  October 6  Report to [facility]tr[facility] "A" [address] at 10:00 AM.  Call this number if you have problems the morning of surgery: [phone]   Remember:   Do not eat food or drink liquids after midnight.   Take these medicines the morning of surgery with A SIP OF WATER: Albuterol (if needed), Xanax, Celexa, Hydrocodone or Tramadol (if needed), Tamoxifen,    STOP Naproxen today  Do not take Aspirin, Aleve, Naproxen, Advil, Ibuprofen, Vitamin, Herbs, or Supplements starting today  Do not wear jewelry, make-up or nail polish.  Do not wear lotions, powders, or perfumes. You may wear deodorant.  Do not shave 48 hours prior to surgery. Men may shave face and neck.  Do not bring valuables to the hospital.  [facility] is not responsible                  for any belongings or valuables.               Contacts, dentures or bridgework may not be worn into surgery.  Leave suitcase in the car. After surgery it may be brought to your room.  For patients admitted to the hospital, discharge time is determined by your                treatment team.               Patients discharged the day of surgery will not be allowed to drive  home.  Name and phone number of your driver: Family/ friend  Special Instructions: Shower using CHG 2 nights before surgery and the night before surgery.  If you shower the day of surgery use CHG.  Use special wash - you have one bottle of CHG for all showers.  You should use approximately 1/3 of the bottle for each shower.   Please read over the following fact sheets that you were given: Pain Booklet, Coughing and Deep Breathing and Surgical Site Infection Prevention

## 2013-03-05 ENCOUNTER — Encounter (HOSPITAL_COMMUNITY)
Admission: RE | Admit: 2013-03-05 | Discharge: 2013-03-05 | Disposition: A | Discharge status: Home / Self Care | Payer: Medicare Other | Source: Ambulatory Visit | Attending: Plastic Surgery | Admitting: Plastic Surgery

## 2013-03-05 ENCOUNTER — Encounter (HOSPITAL_COMMUNITY): Payer: Self-pay

## 2013-03-05 DIAGNOSIS — Z01810 Encounter for preprocedural cardiovascular examination: Secondary | ICD-10-CM | POA: Insufficient documentation

## 2013-03-05 DIAGNOSIS — Z01812 Encounter for preprocedural laboratory examination: Secondary | ICD-10-CM | POA: Insufficient documentation

## 2013-03-05 DIAGNOSIS — Z01818 Encounter for other preprocedural examination: Secondary | ICD-10-CM | POA: Insufficient documentation

## 2013-03-05 HISTORY — DX: Lymphedema, not elsewhere classified: I89.0

## 2013-03-05 HISTORY — DX: Major depressive disorder, single episode, unspecified: F32.9

## 2013-03-05 HISTORY — DX: Depression, unspecified: F32.A

## 2013-03-05 HISTORY — DX: Anxiety disorder, unspecified: F41.9

## 2013-03-05 LAB — BASIC METABOLIC PANEL
BUN: 10 mg/dL (ref 6–23)
CO2: 22 mEq/L (ref 19–32)
Chloride: 102 mEq/L (ref 96–112)
Creatinine, Ser: 0.51 mg/dL (ref 0.50–1.10)
GFR calc Af Amer: >90 mL/min (ref >90)
Glucose, Bld: 216 mg/dL — ABNORMAL HIGH (ref 70–99)
Potassium: 3.3 mEq/L — ABNORMAL LOW (ref 3.5–5.1)

## 2013-03-05 LAB — CBC
HCT: 38.6 % (ref 36.0–46.0)
Hemoglobin: 13 g/dL (ref 12.0–15.0)
MCHC: 33.7 g/dL (ref 30.0–36.0)
MCV: 87.5 fL (ref 78.0–100.0)
RDW: 13.6 % (ref 11.5–15.5)

## 2013-03-05 LAB — HCG, SERUM, QUALITATIVE: Preg, Serum: NEGATIVE

## 2013-03-06 ENCOUNTER — Telehealth: Payer: Self-pay | Admitting: Family Medicine

## 2013-03-06 NOTE — Progress Notes (Addendum)
Anesthesia Chart Review:  Patient is a 37 year old female scheduled for right breast latissimus with expander placement for breast reconstruction on 03/12/13 by Dr.Sanger.    History includes stage III right breast cancer s/p right modified radical mastectomy 02/2010 with residual 2.5 mm tumor and 1/21 lymph nodes involved s/p chemoradiation, right arm lymphedema, morbid obesity, former smoker, HTN, anxiety, depression, seasonal allergies, hernia repair, nasal septal surgery. PCP is listed as Dr. Levert Feinstein [facility]).  EKG on 03/05/13 showed NSR.  Echo on 05/27/10 showed: Left ventricle: The cavity size was normal. Wall thickness was normal. Systolic function was normal. The estimated ejection fraction was in the range of 60% to 65%. Left ventricular diastolic function parameters were normal.   CXR on 02/22/13 showed no interval change or active cardiopulmonary disease.  Preoperative labs noted.  K+ 3.3, non-fasting glucose 216. WBC 11.0, H/H 13.0/38.6. Negative serum pregnancy test. She has no documented history of DM. She was given a five day course of prednisone starting 02/22/13 (starting dose of 50 mg) for acute bronchitis. I spoke with Danford Bad who notified Dr. Kelly Splinter of patient's elevated glucose.  She has requested that patient follow-up at her PCP office this week, but at this point would plan to proceed.  I did notify Danford Bad that anesthesia would like her glucose < 200 preoperatively.  I've also spoke with patient about glucose results.  She was eating candy just before her PAT interview.  She had also had a biscuit and a Coke for breakfast that morning.  I did notify Ms. Amrhein that Dr. Kelly Splinter would like her to be seen this week at her PCP office for hyperglycemia follow-up.  I would anticipate her fasting glucose to be < 200, but she will need to verify that she does not in fact have DM2.  Will defer further recommendations to her PCP.  Patient will need a CBG on arrival.   I'll update her PCP office once they are open after lunch.  Velna Ochs [facility]/Anesthesiology Phone [phone] 03/06/2013 12:43 PM  Addendum: 03/09/13 1:50 PM Patient seen today at her PCP office.  A1C was 6.4. Due to surgery next week, they are planning to follow-up in 4-8 weeks after surgery to determine if oral DM agents will need to be started.  She will get a CBG on arrival.

## 2013-03-06 NOTE — Telephone Encounter (Signed)
Pt had blood work done for surgery on Monday. Her sugar level was 216 (she had eaten and was eating candy) Dr wanted her to have it rechecked . Please advise

## 2013-03-06 NOTE — Telephone Encounter (Signed)
Called pt and left message on identifiable voice mail that patient will need to schedule an OV to discuss an elevated blood sugar reading.   Ansley Stanwood, Darlyne Russian, CMA

## 2013-03-07 ENCOUNTER — Other Ambulatory Visit: Payer: Self-pay | Admitting: *Deleted

## 2013-03-07 MED ORDER — TRAMADOL HCL 50 MG PO TABS: 50.0000 mg | ORAL_TABLET | Freq: Four times a day (QID) | ORAL | Status: DC | PRN

## 2013-03-09 ENCOUNTER — Encounter: Payer: Self-pay | Admitting: Family Medicine

## 2013-03-09 ENCOUNTER — Ambulatory Visit (INDEPENDENT_AMBULATORY_CARE_PROVIDER_SITE_OTHER): Payer: Medicare Other | Admitting: Family Medicine

## 2013-03-09 VITALS — BP 124/85 | HR 82 | Temp 98.1°F | Ht 64.0 in | Wt 268.0 lb

## 2013-03-09 DIAGNOSIS — R739 Hyperglycemia, unspecified: Secondary | ICD-10-CM

## 2013-03-09 DIAGNOSIS — R7309 Other abnormal glucose: Secondary | ICD-10-CM

## 2013-03-09 LAB — POCT GLYCOSYLATED HEMOGLOBIN (HGB A1C): Hemoglobin A1C: 6.4

## 2013-03-09 NOTE — Assessment & Plan Note (Signed)
Patient with elevated blood sugar on pre-op labs. A1C 6.4 which is suggestive of prediabetes. Since she is planning a major surgery next week, we opted to not start medication with Metformin today. She is eligible to go forward with surgery as planned, and will f/u in 4-8 weeks to see how her blood sugars are then. She has been given information and encouraged to diet/exercise after her procedure. Return if she feels worse or if anything changes.

## 2013-03-09 NOTE — Progress Notes (Signed)
Patient ID: Karen Hopkins, female   DOB: October 03, 1975, 37 y.o.   MRN: [id number]  [facility] Adalina Dopson M. Maryuri Warnke, MD Phone: [phone]   Subjective: HPI: Patient is a 38 y.o. female presenting to clinic today for elevated blood sugar on pre-op labs on 03/05/13.  Was on Prednisone (9/18 for 5 days) and had been eating candy in the waiting room prior to that lab. She had GDM with her second child, and her father had DM.  Is having latissimus flap reconstruction on 03/12/2013 and was recommended to have blood sugars checked out here prior to surgery. A1C collected.  History Reviewed: Non smoker.  ROS: Please see HPI above.  Objective: Office vital signs reviewed. BP 124/85  Pulse 82  Temp(Src) 98.1 F (36.7 C) (Oral)  Ht 5\' 4"  (1.626 m)  Wt 268 lb (121.564 kg)  BMI 45.98 kg/m2  LMP 12/19/2012  Physical Examination:  General: Awake, alert. NAD HEENT: Atraumatic, normocephalic Pulm: CTAB, no wheezes Cardio: RRR, no murmurs appreciated Abdomen:+BS, soft, nontender, nondistended Extremities: No edema Neuro: Grossly intact  Assessment: 37 y.o. female with hyperglycemia  Plan: See Problem List and After Visit Summary

## 2013-03-09 NOTE — Patient Instructions (Addendum)
Insulin Resistance Blood sugar (glucose) levels are controlled by a hormone called insulin. Insulin is made by your pancreas. When your blood glucose goes up, insulin is released into your blood. Insulin is required for your body to function normally. However, your body can become resistant to your own insulin or to insulin given to treat diabetes. In either case, insulin resistance can lead to serious problems. These problems include:  Type 2 diabetes.  Heart disease.  High blood pressure.  Stroke.  Polycystic ovary syndrome.  Fatty liver. CAUSES  Insulin resistance can develop for many different reasons. It is more likely to happen in people with these conditions or characteristics:  Obesity.  Inactivity.  Pregnancy.  High blood pressure.  Stress.  Steroid use.  Infection or severe illness.  Increased levels of cholesterol and triglycerides. SYMPTOMS  There are no symptoms. You may have symptoms related to the various complications of insulin resistance.  DIAGNOSIS  Several different things can make your caregiver suspect you have insulin resistance. These include:  High blood glucose (hyperglycemia).  Abnormal cholesterol levels.  High uric acid levels.  Changes related to blood pressure.  Changes related to inflammation. Insulin resistance can be determined with blood tests. An elevated insulin level when you have not eaten might suggest resistance. Other more complicated tests are sometimes necessary. TREATMENT  Lifestyle changes are the most important treatment for insulin resistance.   If you are overweight and you have insulin resistance, you can improve your insulin sensitivity by losing weight.  Moderate exercise for 30 40 minutes, 4 days a week, can improve insulin sensitivity. Some medicines can also help improve your insulin sensitivity. Your caregiver can discuss these with you if they are appropriate.  HOME CARE INSTRUCTIONS   Do not  smoke.  Keep your weight at a healthy level.  Get exercise.  If you have diabetes, follow your caregiver's directions.  If you have high blood pressure, follow your caregiver's directions.  Only take prescription medicines for pain, fever, or discomfort as directed by your caregiver. SEEK MEDICAL CARE IF:   You are diabetic and you are having problems keeping your blood glucose levels at target range.  You are having episodes of low blood glucose (hypoglycemia).  You feel you might be having side effects from your medicines.  You have symptoms of an illness that is not improving after 3 4 days.  You have a sore or wound that is not healing.  You notice a change in vision or a new problem with your vision. SEEK IMMEDIATE MEDICAL CARE IF:   Your blood glucose goes below 70, especially if you have confusion, lightheadedness, or other symptoms with it.  Your blood glucose is very high (as advised by your caregiver) twice in a row.  You pass out.  You have chest pain or trouble breathing.  You have a sudden, severe headache.  You have sudden weakness in one arm or one leg.  You have sudden difficulty speaking or swallowing.  You develop vomiting or diarrhea that is getting worse or not improving after 1 day. Document Released: 07/13/2005 Document Revised: 11/23/2011 Document Reviewed: 07/11/2008 [facility] Patient Information 2014 [place], [place].   Metformin tablets What is this medicine? METFORMIN (met FOR min) is used to treat type 2 diabetes. It helps to control blood sugar. Treatment is combined with diet and exercise. This medicine can be used alone or with other medicines for diabetes. This medicine may be used for other purposes; ask your health care provider  or pharmacist if you have questions. What should I tell my health care provider before I take this medicine? They need to know if you have any of these conditions: -anemia -frequently drink alcohol-containing  beverages -become easily dehydrated -heart attack -heart failure that is treated with medications -kidney disease -liver disease -polycystic ovary syndrome -serious infection or injury -vomiting -an unusual or allergic reaction to metformin, other medicines, foods, dyes, or preservatives -pregnant or trying to get pregnant -breast-feeding How should I use this medicine? Take this medicine by mouth. Take it with meals. Swallow the tablets with a drink of water. Follow the directions on the prescription label. Take your medicine at regular intervals. Do not take your medicine more often than directed. Talk to your pediatrician regarding the use of this medicine in children. While this drug may be prescribed for children as young as 13 years of age for selected conditions, precautions do apply. Overdosage: If you think you have taken too much of this medicine contact a poison control center or emergency room at once. NOTE: This medicine is only for you. Do not share this medicine with others. What if I miss a dose? If you miss a dose, take it as soon as you can. If it is almost time for your next dose, take only that dose. Do not take double or extra doses. What may interact with this medicine? Do not take this medicine with any of the following medications: -dofetilide -gatifloxacin -certain contrast medicines given before X-rays, CT scans, MRI, or other procedures This medicine may also interact with the following medications: -digoxin -diuretics -female hormones, like estrogens or progestins and birth control pills -isoniazid -medicines for blood pressure, heart disease, irregular heart beat -morphine -nicotinic acid -phenothiazines like chlorpromazine, mesoridazine, prochlorperazine, thioridazine -phenytoin -procainamide -quinidine -quinine -ranitidine -steroid medicines like prednisone or cortisone -stimulant medicines for attention disorders, weight loss, or to stay  awake -thyroid medicines -trimethoprim -vancomycin This list may not describe all possible interactions. Give your health care provider a list of all the medicines, herbs, non-prescription drugs, or dietary supplements you use. Also tell them if you smoke, drink alcohol, or use illegal drugs. Some items may interact with your medicine. What should I watch for while using this medicine? Visit your doctor or health care professional for regular checks on your progress. Learn how to check your blood sugar. Learn the symptoms of low and high blood sugar and how to manage them. If you have low blood sugar, eat or drink something that has sugar. Make sure others know to get medical help quickly if you have serious symptoms of low blood sugar, like if you become unconscious or have a seizure. If you need surgery or if you will need a procedure with contrast drugs, tell your doctor or health care professional that you are taking this medicine. Wear a medical identification bracelet or chain to say you have diabetes, and carry a card that lists all your medications. What side effects may I notice from receiving this medicine? Side effects that you should report to your doctor or health care professional as soon as possible: -allergic reactions like skin rash, itching or hives, swelling of the face, lips, or tongue -breathing problems -feeling faint or lightheaded, falls -low blood sugar (ask your doctor or health care professional for a list of these symptoms) -muscle aches or pains -slow or irregular heartbeat -unusual stomach pain or discomfort -unusually tired or weak Side effects that usually do not require medical attention (report to  your doctor or health care professional if they continue or are bothersome): -diarrhea -headache -heartburn -metallic taste in mouth -nausea -stomach gas, upset This list may not describe all possible side effects. Call your doctor for medical advice about side  effects. You may report side effects to FDA at 1-800-FDA-1088. Where should I keep my medicine? Keep out of the reach of children. Store at room temperature between 15 and 30 degrees C (59 and 86 degrees F). Protect from moisture and light. Throw away any unused medicine after the expiration date. NOTE: This sheet is a summary. It may not cover all possible information. If you have questions about this medicine, talk to your doctor, pharmacist, or health care provider.  2013, [employer]. (12/11/2007 3:40:54 PM)

## 2013-03-12 ENCOUNTER — Encounter (HOSPITAL_COMMUNITY): Payer: Self-pay | Admitting: Vascular Surgery

## 2013-03-12 ENCOUNTER — Encounter (HOSPITAL_COMMUNITY): Payer: Self-pay | Admitting: Plastic Surgery

## 2013-03-12 ENCOUNTER — Encounter (HOSPITAL_COMMUNITY)
Admission: RE | Disposition: A | Discharge status: Home / Self Care | Payer: Self-pay | Source: Ambulatory Visit | Attending: Plastic Surgery

## 2013-03-12 ENCOUNTER — Inpatient Hospital Stay (HOSPITAL_COMMUNITY)
Admission: RE | Admit: 2013-03-12 | Discharge: 2013-03-16 | DRG: 584 | Disposition: A | Discharge status: Home / Self Care | Payer: Medicare Other | Source: Ambulatory Visit | Attending: Plastic Surgery | Admitting: Plastic Surgery

## 2013-03-12 ENCOUNTER — Inpatient Hospital Stay (HOSPITAL_COMMUNITY): Payer: Medicare Other | Admitting: Vascular Surgery

## 2013-03-12 DIAGNOSIS — C50919 Malignant neoplasm of unspecified site of unspecified female breast: Secondary | ICD-10-CM | POA: Diagnosis present

## 2013-03-12 DIAGNOSIS — Z6841 Body Mass Index (BMI) 40.0 and over, adult: Secondary | ICD-10-CM

## 2013-03-12 DIAGNOSIS — Z9010 Acquired absence of unspecified breast and nipple: Secondary | ICD-10-CM

## 2013-03-12 DIAGNOSIS — C50911 Malignant neoplasm of unspecified site of right female breast: Secondary | ICD-10-CM

## 2013-03-12 DIAGNOSIS — Z421 Encounter for breast reconstruction following mastectomy: Principal | ICD-10-CM

## 2013-03-12 DIAGNOSIS — Z9011 Acquired absence of right breast and nipple: Secondary | ICD-10-CM

## 2013-03-12 HISTORY — PX: LATISSIMUS FLAP TO BREAST: SHX5357

## 2013-03-12 LAB — BASIC METABOLIC PANEL
BUN: 8 mg/dL (ref 6–23)
CO2: 22 mEq/L (ref 19–32)
Calcium: 8.7 mg/dL (ref 8.4–10.5)
Chloride: 104 mEq/L (ref 96–112)
GFR calc Af Amer: >90 mL/min (ref >90)
GFR calc non Af Amer: >90 mL/min (ref >90)
Glucose, Bld: 144 mg/dL — ABNORMAL HIGH (ref 70–99)
Potassium: 3.4 mEq/L — ABNORMAL LOW (ref 3.5–5.1)

## 2013-03-12 LAB — CBC
HCT: 34.1 % — ABNORMAL LOW (ref 36.0–46.0)
Hemoglobin: 11.9 g/dL — ABNORMAL LOW (ref 12.0–15.0)
MCH: 30.5 pg (ref 26.0–34.0)
MCHC: 34.9 g/dL (ref 30.0–36.0)
MCV: 87.4 fL (ref 78.0–100.0)
RDW: 13.4 % (ref 11.5–15.5)

## 2013-03-12 SURGERY — RECONSTRUCTION, BREAST, USING LATISSIMUS DORSI MYOCUTANEOUS FLAP
Anesthesia: General | Site: Breast | Laterality: Right | Wound class: Clean

## 2013-03-12 MED ORDER — DOCUSATE SODIUM 100 MG PO CAPS
100.0000 mg | ORAL_CAPSULE | Freq: Three times a day (TID) | ORAL | Status: DC
  Administered 2013-03-12 – 2013-03-16 (×11): 100 mg via ORAL
  Filled 2013-03-12 (×11): qty 1

## 2013-03-12 MED ORDER — PHENYLEPHRINE HCL 10 MG/ML IJ SOLN
INTRAMUSCULAR | Status: DC | PRN
  Administered 2013-03-12 (×4): 40 ug via INTRAVENOUS

## 2013-03-12 MED ORDER — LACTATED RINGERS IV SOLN
INTRAVENOUS | Status: DC | PRN
  Administered 2013-03-12 (×3): via INTRAVENOUS

## 2013-03-12 MED ORDER — ALBUTEROL SULFATE HFA 108 (90 BASE) MCG/ACT IN AERS
INHALATION_SPRAY | RESPIRATORY_TRACT | Status: DC | PRN
  Administered 2013-03-12: 5 via RESPIRATORY_TRACT

## 2013-03-12 MED ORDER — TRAMADOL HCL 50 MG PO TABS
50.0000 mg | ORAL_TABLET | Freq: Four times a day (QID) | ORAL | Status: DC | PRN
  Administered 2013-03-13 – 2013-03-16 (×4): 50 mg via ORAL
  Filled 2013-03-12 (×5): qty 1

## 2013-03-12 MED ORDER — KCL IN DEXTROSE-NACL 20-5-0.45 MEQ/L-%-% IV SOLN
INTRAVENOUS | Status: DC
  Administered 2013-03-12 – 2013-03-14 (×4): via INTRAVENOUS
  Administered 2013-03-15: 100 mL/h via INTRAVENOUS
  Filled 2013-03-12 (×11): qty 1000

## 2013-03-12 MED ORDER — MEPERIDINE HCL 25 MG/ML IJ SOLN: 6.2500 mg | INTRAMUSCULAR | Status: DC | PRN

## 2013-03-12 MED ORDER — HYDROMORPHONE HCL PF 1 MG/ML IJ SOLN
INTRAMUSCULAR | Status: AC
Start: 2013-03-12 — End: 2013-03-13
  Filled 2013-03-12: qty 1

## 2013-03-12 MED ORDER — ALBUTEROL SULFATE HFA 108 (90 BASE) MCG/ACT IN AERS
2.0000 | INHALATION_SPRAY | Freq: Four times a day (QID) | RESPIRATORY_TRACT | Status: DC | PRN
  Filled 2013-03-12: qty 6.7

## 2013-03-12 MED ORDER — SODIUM CHLORIDE 0.9 % IJ SOLN: 9.0000 mL | INTRAMUSCULAR | Status: DC | PRN

## 2013-03-12 MED ORDER — NALOXONE HCL 0.4 MG/ML IJ SOLN: 0.4000 mg | INTRAMUSCULAR | Status: DC | PRN

## 2013-03-12 MED ORDER — CIPROFLOXACIN IN D5W 400 MG/200ML IV SOLN
400.0000 mg | INTRAVENOUS | Status: DC
  Filled 2013-03-12: qty 200

## 2013-03-12 MED ORDER — ONDANSETRON HCL 4 MG/2ML IJ SOLN
INTRAMUSCULAR | Status: DC | PRN
  Administered 2013-03-12: 4 mg via INTRAMUSCULAR

## 2013-03-12 MED ORDER — LIDOCAINE-EPINEPHRINE 1 %-1:100000 IJ SOLN
INTRAMUSCULAR | Status: DC | PRN
  Administered 2013-03-12: 20 mL

## 2013-03-12 MED ORDER — LIDOCAINE HCL (CARDIAC) 20 MG/ML IV SOLN
INTRAVENOUS | Status: DC | PRN
  Administered 2013-03-12: 100 mg via INTRAVENOUS

## 2013-03-12 MED ORDER — GLYCOPYRROLATE 0.2 MG/ML IJ SOLN
INTRAMUSCULAR | Status: DC | PRN
  Administered 2013-03-12: 0.4 mg via INTRAVENOUS

## 2013-03-12 MED ORDER — OXYCODONE HCL 5 MG PO TABS: 5.0000 mg | ORAL_TABLET | Freq: Once | ORAL | Status: DC | PRN

## 2013-03-12 MED ORDER — DEXAMETHASONE SODIUM PHOSPHATE 4 MG/ML IJ SOLN
INTRAMUSCULAR | Status: DC | PRN
  Administered 2013-03-12: 4 mg via INTRAVENOUS

## 2013-03-12 MED ORDER — PROPOFOL 10 MG/ML IV BOLUS
INTRAVENOUS | Status: DC | PRN
  Administered 2013-03-12: 130 mg via INTRAVENOUS

## 2013-03-12 MED ORDER — MIDAZOLAM HCL 5 MG/5ML IJ SOLN
INTRAMUSCULAR | Status: DC | PRN
  Administered 2013-03-12: 2 mg via INTRAVENOUS

## 2013-03-12 MED ORDER — ONDANSETRON HCL 4 MG/2ML IJ SOLN: 4.0000 mg | Freq: Four times a day (QID) | INTRAMUSCULAR | Status: DC | PRN

## 2013-03-12 MED ORDER — OXYCODONE HCL 5 MG/5ML PO SOLN: 5.0000 mg | Freq: Once | ORAL | Status: DC | PRN

## 2013-03-12 MED ORDER — MORPHINE SULFATE 10 MG/ML IJ SOLN
INTRAMUSCULAR | Status: DC | PRN
  Administered 2013-03-12 (×2): 5 mg via INTRAVENOUS

## 2013-03-12 MED ORDER — CITALOPRAM HYDROBROMIDE 20 MG PO TABS
20.0000 mg | ORAL_TABLET | Freq: Every day | ORAL | Status: DC
  Administered 2013-03-13 – 2013-03-16 (×4): 20 mg via ORAL
  Filled 2013-03-12 (×4): qty 1

## 2013-03-12 MED ORDER — EVICEL 5 ML EX KIT
PACK | CUTANEOUS | Status: AC
  Filled 2013-03-12: qty 1

## 2013-03-12 MED ORDER — ONDANSETRON HCL 4 MG/2ML IJ SOLN: 4.0000 mg | Freq: Once | INTRAMUSCULAR | Status: DC | PRN

## 2013-03-12 MED ORDER — LACTATED RINGERS IV SOLN
INTRAVENOUS | Status: DC
  Administered 2013-03-12: 10:00:00 via INTRAVENOUS

## 2013-03-12 MED ORDER — FENTANYL CITRATE 0.05 MG/ML IJ SOLN
INTRAMUSCULAR | Status: DC | PRN
  Administered 2013-03-12 (×4): 50 ug via INTRAVENOUS
  Administered 2013-03-12: 100 ug via INTRAVENOUS
  Administered 2013-03-12: 150 ug via INTRAVENOUS
  Administered 2013-03-12 (×4): 50 ug via INTRAVENOUS
  Administered 2013-03-12: 100 ug via INTRAVENOUS

## 2013-03-12 MED ORDER — CIPROFLOXACIN IN D5W 400 MG/200ML IV SOLN
INTRAVENOUS | Status: DC | PRN
  Administered 2013-03-12: 400 mg via INTRAVENOUS

## 2013-03-12 MED ORDER — EVICEL 5 ML EX KIT
PACK | CUTANEOUS | Status: DC | PRN
  Administered 2013-03-12 (×2): 5 mL

## 2013-03-12 MED ORDER — HYDROMORPHONE HCL PF 1 MG/ML IJ SOLN
INTRAMUSCULAR | Status: AC
  Filled 2013-03-12: qty 1

## 2013-03-12 MED ORDER — MORPHINE SULFATE (PF) 1 MG/ML IV SOLN
INTRAVENOUS | Status: DC
  Administered 2013-03-12 (×2): via INTRAVENOUS
  Administered 2013-03-12: 3 mg via INTRAVENOUS
  Administered 2013-03-12: 6 mg via INTRAVENOUS
  Administered 2013-03-13: 25 mg via INTRAVENOUS
  Administered 2013-03-13: 18.89 mg via INTRAVENOUS
  Administered 2013-03-13: 25 mg via INTRAVENOUS
  Filled 2013-03-12 (×3): qty 25

## 2013-03-12 MED ORDER — ALPRAZOLAM ER 1 MG PO TB24: 1.0000 mg | ORAL_TABLET | Freq: Every day | ORAL | Status: DC

## 2013-03-12 MED ORDER — MORPHINE SULFATE (PF) 1 MG/ML IV SOLN
INTRAVENOUS | Status: AC
  Filled 2013-03-12: qty 25

## 2013-03-12 MED ORDER — NEOSTIGMINE METHYLSULFATE 1 MG/ML IJ SOLN
INTRAMUSCULAR | Status: DC | PRN
  Administered 2013-03-12: 3 mg via INTRAVENOUS

## 2013-03-12 MED ORDER — ALPRAZOLAM 0.5 MG PO TABS
0.5000 mg | ORAL_TABLET | Freq: Two times a day (BID) | ORAL | Status: DC
  Administered 2013-03-13 – 2013-03-16 (×7): 0.5 mg via ORAL
  Filled 2013-03-12 (×7): qty 1

## 2013-03-12 MED ORDER — HYDROMORPHONE HCL PF 1 MG/ML IJ SOLN
0.2500 mg | INTRAMUSCULAR | Status: DC | PRN
  Administered 2013-03-12: 0.25 mg via INTRAVENOUS
  Administered 2013-03-12 (×3): 0.5 mg via INTRAVENOUS
  Administered 2013-03-12: 0.25 mg via INTRAVENOUS

## 2013-03-12 MED ORDER — DIPHENHYDRAMINE HCL 50 MG/ML IJ SOLN: 12.5000 mg | Freq: Four times a day (QID) | INTRAMUSCULAR | Status: DC | PRN

## 2013-03-12 MED ORDER — CIPROFLOXACIN IN D5W 400 MG/200ML IV SOLN
400.0000 mg | Freq: Two times a day (BID) | INTRAVENOUS | Status: DC
  Administered 2013-03-12 – 2013-03-16 (×7): 400 mg via INTRAVENOUS
  Filled 2013-03-12 (×8): qty 200

## 2013-03-12 MED ORDER — DIPHENHYDRAMINE HCL 12.5 MG/5ML PO ELIX: 12.5000 mg | ORAL_SOLUTION | Freq: Four times a day (QID) | ORAL | Status: DC | PRN

## 2013-03-12 MED ORDER — ROCURONIUM BROMIDE 100 MG/10ML IV SOLN
INTRAVENOUS | Status: DC | PRN
  Administered 2013-03-12: 50 mg via INTRAVENOUS
  Administered 2013-03-12: 30 mg via INTRAVENOUS

## 2013-03-12 MED ORDER — LIDOCAINE-EPINEPHRINE 1 %-1:100000 IJ SOLN
INTRAMUSCULAR | Status: AC
  Filled 2013-03-12: qty 1

## 2013-03-12 MED ORDER — ALPRAZOLAM ER 0.5 MG PO TB24: 1.0000 mg | ORAL_TABLET | Freq: Every day | ORAL | Status: DC

## 2013-03-12 SURGICAL SUPPLY — 66 items
ADH SKN CLS APL DERMABOND .7 (GAUZE/BANDAGES/DRESSINGS) ×2
APPLIER CLIP 9.375 MED OPEN (MISCELLANEOUS) ×2
APR CLP MED 9.3 20 MLT OPN (MISCELLANEOUS) ×1
BAG DECANTER FOR FLEXI CONT (MISCELLANEOUS) IMPLANT
BINDER BREAST XXLRG (GAUZE/BANDAGES/DRESSINGS) ×2 IMPLANT
BLADE SURG 10 STRL SS (BLADE) ×2 IMPLANT
BLADE SURG 15 STRL LF DISP TIS (BLADE) ×1 IMPLANT
BLADE SURG 15 STRL SS (BLADE) ×2
BNDG COHESIVE 4X5 TAN STRL (GAUZE/BANDAGES/DRESSINGS) IMPLANT
CANISTER SUCTION 2500CC (MISCELLANEOUS) ×2 IMPLANT
CHLORAPREP W/TINT 26ML (MISCELLANEOUS) ×2 IMPLANT
CLIP APPLIE 9.375 MED OPEN (MISCELLANEOUS) ×1 IMPLANT
COVER SURGICAL LIGHT HANDLE (MISCELLANEOUS) ×2 IMPLANT
DERMABOND ADVANCED (GAUZE/BANDAGES/DRESSINGS) ×2
DERMABOND ADVANCED .7 DNX12 (GAUZE/BANDAGES/DRESSINGS) ×2 IMPLANT
DRAIN CHANNEL 19F RND (DRAIN) ×8 IMPLANT
DRAPE INCISE 23X17 IOBAN STRL (DRAPES) ×1
DRAPE INCISE IOBAN 23X17 STRL (DRAPES) ×1 IMPLANT
DRAPE INCISE IOBAN 85X60 (DRAPES) ×2 IMPLANT
DRAPE ORTHO SPLIT 77X108 STRL (DRAPES) ×4
DRAPE PROXIMA HALF (DRAPES) ×8 IMPLANT
DRAPE SURG ORHT 6 SPLT 77X108 (DRAPES) ×2 IMPLANT
DRAPE WARM FLUID 44X44 (DRAPE) ×2 IMPLANT
DRSG MEPILEX BORDER 4X12 (GAUZE/BANDAGES/DRESSINGS) ×2 IMPLANT
DRSG MEPILEX BORDER 4X8 (GAUZE/BANDAGES/DRESSINGS) IMPLANT
DRSG PAD ABDOMINAL 8X10 ST (GAUZE/BANDAGES/DRESSINGS) ×2 IMPLANT
ELECT BLADE 4.0 EZ CLEAN MEGAD (MISCELLANEOUS) ×2
ELECT BLADE 6.5 EXT (BLADE) ×4 IMPLANT
ELECT CAUTERY BLADE 6.4 (BLADE) ×2 IMPLANT
ELECT REM PT RETURN 9FT ADLT (ELECTROSURGICAL) ×2
ELECTRODE BLDE 4.0 EZ CLN MEGD (MISCELLANEOUS) ×1 IMPLANT
ELECTRODE REM PT RTRN 9FT ADLT (ELECTROSURGICAL) ×1 IMPLANT
EVACUATOR SILICONE 100CC (DRAIN) ×8 IMPLANT
GAUZE XEROFORM 5X9 LF (GAUZE/BANDAGES/DRESSINGS) IMPLANT
GLOVE BIO SURGEON STRL SZ 6.5 (GLOVE) ×10 IMPLANT
GOWN STRL NON-REIN LRG LVL3 (GOWN DISPOSABLE) ×8 IMPLANT
IMPL BREAST TIS EXP M 350CC (Breast) ×1 IMPLANT
IMPLANT BREAST TIS EXP M 350CC (Breast) ×2 IMPLANT
KIT BASIN OR (CUSTOM PROCEDURE TRAY) ×2 IMPLANT
NEEDLE 21 GA WING INFUSION (NEEDLE) ×2 IMPLANT
NS IRRIG 1000ML POUR BTL (IV SOLUTION) ×4 IMPLANT
PACK GENERAL/GYN (CUSTOM PROCEDURE TRAY) ×2 IMPLANT
PAD ARMBOARD 7.5X6 YLW CONV (MISCELLANEOUS) ×6 IMPLANT
PENCIL BUTTON HOLSTER BLD 10FT (ELECTRODE) ×6 IMPLANT
SET ASEPTIC TRANSFER (MISCELLANEOUS) ×2 IMPLANT
SET COLLECT BLD 21X3/4 12 PB (MISCELLANEOUS) IMPLANT
SPONGE GAUZE 4X4 12PLY (GAUZE/BANDAGES/DRESSINGS) ×2 IMPLANT
SPONGE LAP 18X18 X RAY DECT (DISPOSABLE) ×8 IMPLANT
STAPLER VISISTAT 35W (STAPLE) ×2 IMPLANT
STOCKINETTE IMPERVIOUS 9X36 MD (GAUZE/BANDAGES/DRESSINGS) IMPLANT
STOPCOCK 4 WAY LG BORE MALE ST (IV SETS) IMPLANT
STRIP CLOSURE SKIN 1/2X4 (GAUZE/BANDAGES/DRESSINGS) IMPLANT
SUT ETHILON 2 0 FS 18 (SUTURE) ×8 IMPLANT
SUT MNCRL AB 3-0 PS2 18 (SUTURE) ×8 IMPLANT
SUT MNCRL AB 4-0 PS2 18 (SUTURE) ×8 IMPLANT
SUT MON AB 5-0 PS2 18 (SUTURE) IMPLANT
SUT PDS AB 2-0 CT1 27 (SUTURE) ×4 IMPLANT
SUT VIC AB 3-0 PS2 18 (SUTURE) ×4
SUT VIC AB 3-0 PS2 18XBRD (SUTURE) ×2 IMPLANT
SUT VIC AB 3-0 SH 8-18 (SUTURE) ×4 IMPLANT
SYR 50ML SLIP (SYRINGE) IMPLANT
SYR BULB IRRIGATION 50ML (SYRINGE) ×2 IMPLANT
TOWEL OR 17X24 6PK STRL BLUE (TOWEL DISPOSABLE) ×2 IMPLANT
TOWEL OR 17X26 10 PK STRL BLUE (TOWEL DISPOSABLE) ×2 IMPLANT
TRAY FOLEY CATH 14FRSI W/METER (CATHETERS) ×2 IMPLANT
TUBE SUCT ARGYLE STRL (TUBING) ×2 IMPLANT

## 2013-03-12 NOTE — Progress Notes (Signed)
Received patient from PACU post right breast latissimus flap with expander. VSS, alert and oriented, not in any distress , on O2 at 2lpm..oriented to unit and staff.

## 2013-03-12 NOTE — Anesthesia Preprocedure Evaluation (Addendum)
Anesthesia Evaluation  Patient identified by MRN, date of birth, ID band Patient awake    Reviewed: Allergy & Precautions, H&P , NPO status , Patient's Chart, lab work & pertinent test results, reviewed documented beta blocker date and time   Airway Mallampati: II TM Distance: >3 FB Neck ROM: Full    Dental  (+) Teeth Intact and Dental Advisory Given   Pulmonary  breath sounds clear to auscultation        Cardiovascular hypertension, Pt. on medications Rhythm:Regular Rate:Normal     Neuro/Psych PSYCHIATRIC DISORDERS Anxiety Depression    GI/Hepatic negative GI ROS, Neg liver ROS,   Endo/Other  negative endocrine ROS  Renal/GU negative Renal ROS     Musculoskeletal   Abdominal (+) + obese,   Peds  Hematology negative hematology ROS (+)   Anesthesia Other Findings   Reproductive/Obstetrics                           Anesthesia Physical Anesthesia Plan  ASA: II  Anesthesia Plan: General   Post-op Pain Management:    Induction: Intravenous  Airway Management Planned: Oral ETT  Additional Equipment:   Intra-op Plan:   Post-operative Plan: Extubation in OR  Informed Consent: I have reviewed the patients History and Physical, chart, labs and discussed the procedure including the risks, benefits and alternatives for the proposed anesthesia with the patient or authorized representative who has indicated his/her understanding and acceptance.   Dental advisory given  Plan Discussed with: Surgeon and CRNA  Anesthesia Plan Comments:        Anesthesia Quick Evaluation

## 2013-03-12 NOTE — Op Note (Signed)
DATE OF OPERATION: 03/12/2013  PREOPERATIVE DIAGNOSIS: Breast Cancer s/p right mastectomies   POSTOPERATIVE DIAGNOSIS: Same  PROCEDURE: 1. Latissimus flap on the right to reconstruct the breast CPT 19361 2. Tissue expander placement CPT 19357  SURGEON: Alan Ripper Sanger  ASSISTANT: Shawn Rayburn, PA  EBL: 250 cc  SPECIMEN: None  DRAINS: 3 total 19 blake round drains  CONDITION: Stable  COMPLICATIONS: None  INDICATION: The patient, Karen Hopkins, is a 37 y.o. female born on Dec 09, 1975, is here for treatment after a mastectomy.    PROCEDURE DETAILS:  Patient was seen on the morning of her surgery and marked out for her flap. She was then taken to the operating room and given a general anesthetic. She was given an IV and IV antibiotics. An adequate time out was done. She has SCD's and a foley catheter placed. She was placed into the left lateral decubitus position with all key points padded. She was then prepped and draped in the standard sterile fashion using a chloroprep. The paddle design and position was confirmed. The procedure began by incising the margins of the paddle and dissecting out until all four margins of the muscle were identified. The muscle was then released inferiorly and anteriorly and care was taken not to pick up the serratus anteriorly or the paraspinous muscles posteriorly. The flap was then raised to the scapula and released and rotated medially. Care was taken to protect the vascular pedicle throughout this portion of the procedure. The paddle and muscle looked healthy throughout. I then opened the old mastectomy scar and raised the flaps on top of the pectoralis muscle as there was a reasonable amount of skin. The posterior and anterior pockets were then connected in the plane above the muscle. The muscle from the back and the skin paddle were then rotated into the chest pocket and tacked in place with staples and covered with an ioban dressing. The flap pedicle was inspected  and there was no tension. The back pocket was hemostased and Evicel placed. Two #19 blake round drains were place and secured with 3-0 Silk. The back incision was closed with buried 3-0 Vicryl, followed by 4-0 Monocryl and 5-0 Monocryl.  Dermabond and a protective dressing was applied.   The patient was then repositioned onto her back and the chest was prepped and draped with a betadine. The breast pocket was inspected and hemostases was achieved with electrocautery. The muscle was then secured superiorly to the pectoralis muscle with 3-0 Monocryl. A 350 cc expander was chosen. It was soaked in triple antibiotic solution and evacuated of air and then filled with 100 cc of sterile saline. The inferior portion of the muscle was tacked to the inframammary fold with 3-0 Monocryl.  One drain was placed on this side and secured with 3-0 Silk. The flap was then closed with 3-0 Monocryl deep, followed by 4-0 Monocryl and the skin closed with 5-0 Monocryl.  Dermabond, ABDs and a breast binder was applied.    The patient was allowed to wake up and taken to recovery room in stable condition at the end of the case. The family was notified at the end of the case as well.

## 2013-03-12 NOTE — Interval H&P Note (Signed)
History and Physical Interval Note:  03/12/2013 8:24 AM  Karen Hopkins  has presented today for surgery, with the diagnosis of aquired absence of breast and nipple  The various methods of treatment have been discussed with the patient and family. After consideration of risks, benefits and other options for treatment, the patient has consented to  Procedure(s): RIGHT BREAST LATISSIMUS FLAP WITH EXPANDER PLACEMENT (Right) as a surgical intervention .  The patient's history has been reviewed, patient examined, no change in status, stable for surgery.  I have reviewed the patient's chart and labs.  Questions were answered to the patient's satisfaction.     SANGER,CLAIRE

## 2013-03-12 NOTE — Transfer of Care (Signed)
Immediate Anesthesia Transfer of Care Note  Patient: Karen Hopkins  Procedure(s) Performed: Procedure(s): RIGHT BREAST LATISSIMUS FLAP WITH EXPANDER PLACEMENT (Right)  Patient Location: PACU  Anesthesia Type:General  Level of Consciousness: patient cooperative and responds to stimulation  Airway & Oxygen Therapy: Patient Spontanous Breathing and Patient connected to nasal cannula oxygen  Post-op Assessment: Report given to PACU RN, Post -op Vital signs reviewed and stable and Patient moving all extremities X 4  Post vital signs: Reviewed and stable  Complications: No apparent anesthesia complications

## 2013-03-12 NOTE — Brief Op Note (Signed)
03/12/2013  3:52 PM  PATIENT:  Karen Hopkins  37 y.o. female  PRE-OPERATIVE DIAGNOSIS:  aquired absence of breast and nipple  POST-OPERATIVE DIAGNOSIS:  aquired absence of right  breast and nipple  PROCEDURE:  Procedure(s): RIGHT BREAST LATISSIMUS FLAP WITH EXPANDER PLACEMENT (Right)  SURGEON:  Surgeon(s) and Role:    * Riot Barrick Sanger, DO - Primary  PHYSICIAN ASSISTANT: Shawn Rayburn, PA  ASSISTANTS: none   ANESTHESIA:   general  EBL:  Total I/O In: 2000 [I.V.:2000] Out: 180 [Urine:180]  BLOOD ADMINISTERED:none  DRAINS: (3) Jackson-Pratt drain(s) with closed bulb suction in the one in the right breast and two in the back.   LOCAL MEDICATIONS USED:  LIDOCAINE   SPECIMEN:  No Specimen  DISPOSITION OF SPECIMEN:  N/A  COUNTS:  YES  TOURNIQUET:  * No tourniquets in log *  DICTATION: .Dragon Dictation  PLAN OF CARE: Admit to inpatient   PATIENT DISPOSITION:  PACU - hemodynamically stable.   Delay start of Pharmacological VTE agent (>24hrs) due to surgical blood loss or risk of bleeding: no

## 2013-03-12 NOTE — H&P (View-Only) (Signed)
Karen Hopkins  02/27/2013 9:15 AM   Office Visit  MRN:  [id number]  Department: Plastic Surgery  Dept Phone: [phone]  Description: Female DOB: 21-Oct-1975  Provider: Fanny Bien Rayburn, PA-C    Diagnoses    Acquired absence of breast and absent nipple, right    -  Primary   V45.71     Vitals - Last Recorded    146/85  89  98.7 F (37.1 C)  1.626 m (5\' 4" )  117.935 kg (260 lb)  44.61 kg/m2     Subjective:     Patient ID: Karen Hopkins is a 37 y.o. female.  HPI The patient is a 37 yrs old bf here for history and physical for right breast reconstruction with a latissimus and expander reconstruction.   History:  The patient had pain and skin thickening with erythema in the right breast in early 2011.  She was initially treated with Bactrim with the thought of infection due to her recent delivery of her child.  The antibiotic was not well tolerated and did not improve the condition so a mammogram was done.  The mammogram showed scattered fibroglandular densities, and an asymmetric density in the right upper outer quadrant with suspicious microcalcifications. There were also enlarged right axillary lymph nodes. The left was normal. The ultrasound showed an ill defined area of hypoechoic tissue in the right upper outer quadrant and abnormal right axillary lymph nodes. The ultrasound guided biopsy showed a poorly differentiated invasive ductal carcinoma, which was ER poor at 3%, PR positive at 14% with an elevated proliferation marker at 50%, with strong amplification of HER-2 by CISH with a ratio of 5.20.   Bilateral breast MRIs were obtained 09/02/2009 and showed an area 10.6 cm in the right breast showing confluent mass and non-mass enhancement. There was marked skin thickening involving the entire right breast, and numerous bulky right axillary lymph nodes were identified. The left breast was unremarkable, and there was no evidence of internal mammary lymph node involvement.   Patient was  treated in the neoadjuvant setting with 4 dose dense cycles of doxorubicin and cyclophosphamide, followed by paclitaxel and trastuzumab. She underwent definitive right modified radical mastectomy in 02/2010, with a residual 2.5 mm area of tumor in the breast, grade 3, and one of 21 lymph nodes involved. She finished radiation in 2/21012. She was treated for right arm lymphedema which has improved. The skin shows signs of radiation but not damaged. There is some hyperpigmentation. Her abdomen is very larger.  She had an abdominal hernia repair in the past. She has had issues with chronic pain following the mastectomy.   The following portions of the patient's history were reviewed and updated as appropriate: allergies, current medications, past family history, past medical history, past social history, past surgical history and problem list.  Review of Systems  Constitutional: Negative.   HENT: Negative.   Eyes: Negative.   Respiratory:        Recent episode of bronchitis treated with antibiotics and prednisone. Remote hx of asthma as a child  Cardiovascular:        Chronic right upper extremity lymphedema following axillary node resection  Gastrointestinal: Negative.   Endocrine: Negative.   Genitourinary: Negative.   Neurological: Negative.   Hematological: Negative.   Psychiatric/Behavioral: Negative.    Objective:    Physical Exam  Constitutional: She is oriented to Karen Hopkins, place, and time. She appears well-developed and well-nourished. No distress.  Centrally obese female  HENT:  Head: Normocephalic and atraumatic.   Nose: Nose normal.   Mouth/Throat: Oropharynx is clear and moist.  Eyes: Conjunctivae and EOM are normal. Pupils are equal, round, and reactive to light.  Neck: Normal range of motion. Neck supple. No JVD present. No tracheal deviation present.  Cardiovascular: Normal rate, regular rhythm and intact distal pulses.  Exam reveals no gallop and no friction rub.    No  murmur heard. Pulmonary/Chest: Effort normal and breath sounds normal. No stridor. No respiratory distress. She has no wheezes. She has no rales. She exhibits no tenderness.  Healed right mastectomy scar and surrounding thickening and darkening of right breast skin following radiation  Abdominal: Soft. Bowel sounds are normal. She exhibits no distension and no mass. There is no tenderness.  Musculoskeletal: Normal range of motion. She exhibits edema (chronic lymphedema of right upper extremity). She exhibits no tenderness.  Lymphadenopathy:    She has no cervical adenopathy.  Neurological: She is alert and oriented to Karen Hopkins, place, and time. No cranial nerve deficit. She exhibits normal muscle tone.  Skin: Skin is warm and dry. No rash noted. No erythema.  Psychiatric: She has a normal mood and affect. Her behavior is normal. Judgment and thought content normal.    Assessment:   1.  Acquired absence of breast and absent nipple, right     Plan:   Right breast latissimus with expander placement. The procedure, possible risks, benefits and complications were discussed with the patient and she desires to proceed and consent was obtained.      Medications Ordered This Encounter     HYDROcodone-acetaminophen (NORCO) 5-325 mg per tablet Take 1 tablet by mouth every 6 (six) hours as needed for Pain.    diazepam (VALIUM) 2 MG tabletTake 1 tablet (2 mg total) by mouth every 6 (six) hours as needed for up to 10 days for Anxiety.    promethazine (PHENERGAN) 12.5 MG tablet Take 1 tablet (12.5 mg total) by mouth every 6 (six) hours as needed for up to 7 days for Nausea.    cephalexin (KEFLEX) 500 MG capsule Take 1 capsule (500 mg total) by mouth 4 times daily.

## 2013-03-12 NOTE — Anesthesia Postprocedure Evaluation (Signed)
Anesthesia Post Note  Patient: Karen Hopkins  Procedure(s) Performed: Procedure(s) (LRB): RIGHT BREAST LATISSIMUS FLAP WITH EXPANDER PLACEMENT (Right)  Anesthesia type: general  Patient location: PACU  Post pain: Pain level controlled  Post assessment: Patient's Cardiovascular Status Stable  Last Vitals:  Filed Vitals:   03/12/13 1800  BP: 159/70  Pulse: 76  Temp: 38 C  Resp: 16    Post vital signs: Reviewed and stable  Level of consciousness: sedated  Complications: No apparent anesthesia complications

## 2013-03-12 NOTE — Preoperative (Signed)
Beta Blockers   Reason not to administer Beta Blockers:Not Applicable 

## 2013-03-13 ENCOUNTER — Encounter (HOSPITAL_COMMUNITY): Payer: Self-pay | Admitting: Plastic Surgery

## 2013-03-13 MED ORDER — ADULT MULTIVITAMIN W/MINERALS CH
1.0000 | ORAL_TABLET | Freq: Every day | ORAL | Status: DC
  Administered 2013-03-13 – 2013-03-16 (×4): 1 via ORAL
  Filled 2013-03-13 (×4): qty 1

## 2013-03-13 MED ORDER — SODIUM CHLORIDE 0.9 % IJ SOLN: 9.0000 mL | INTRAMUSCULAR | Status: DC | PRN

## 2013-03-13 MED ORDER — ONDANSETRON HCL 4 MG/2ML IJ SOLN: 4.0000 mg | Freq: Four times a day (QID) | INTRAMUSCULAR | Status: DC | PRN

## 2013-03-13 MED ORDER — NALOXONE HCL 0.4 MG/ML IJ SOLN
0.4000 mg | INTRAMUSCULAR | Status: DC | PRN
Start: 2013-03-13 — End: 2013-03-14

## 2013-03-13 MED ORDER — TRIAMTERENE-HCTZ 37.5-25 MG PO TABS
1.0000 | ORAL_TABLET | Freq: Every day | ORAL | Status: DC
  Administered 2013-03-13 – 2013-03-16 (×4): 1 via ORAL
  Filled 2013-03-13 (×4): qty 1

## 2013-03-13 MED ORDER — HYDROMORPHONE 0.3 MG/ML IV SOLN
INTRAVENOUS | Status: DC
  Administered 2013-03-13: 18:00:00 via INTRAVENOUS
  Administered 2013-03-13 (×2): 2.4 mg via INTRAVENOUS
  Administered 2013-03-13: 4.06 mg via INTRAVENOUS
  Administered 2013-03-13: 11:00:00 via INTRAVENOUS
  Administered 2013-03-13: 2.59 mg via INTRAVENOUS
  Administered 2013-03-14: 06:00:00 via INTRAVENOUS
  Administered 2013-03-14: 2.59 mg via INTRAVENOUS
  Administered 2013-03-14: 3.55 mg via INTRAVENOUS
  Administered 2013-03-14: 0.8 mg via INTRAVENOUS
  Administered 2013-03-14: 0.999 mg via INTRAVENOUS
  Filled 2013-03-13: qty 25
  Filled 2013-03-13: qty 200
  Filled 2013-03-13: qty 25

## 2013-03-13 MED ORDER — DIAZEPAM 2 MG PO TABS
2.0000 mg | ORAL_TABLET | Freq: Once | ORAL | Status: AC
  Administered 2013-03-13: 2 mg via ORAL
  Filled 2013-03-13: qty 1

## 2013-03-13 MED ORDER — ENSURE COMPLETE PO LIQD
237.0000 mL | Freq: Two times a day (BID) | ORAL | Status: DC
  Administered 2013-03-14 – 2013-03-16 (×4): 237 mL via ORAL

## 2013-03-13 MED ORDER — METHOCARBAMOL 500 MG PO TABS
500.0000 mg | ORAL_TABLET | Freq: Four times a day (QID) | ORAL | Status: DC
  Administered 2013-03-13 – 2013-03-16 (×12): 500 mg via ORAL
  Filled 2013-03-13 (×20): qty 1

## 2013-03-13 MED ORDER — TAMOXIFEN CITRATE 10 MG PO TABS
20.0000 mg | ORAL_TABLET | Freq: Two times a day (BID) | ORAL | Status: DC
  Administered 2013-03-13 – 2013-03-15 (×5): 20 mg via ORAL
  Filled 2013-03-13 (×6): qty 2

## 2013-03-13 MED ORDER — DIPHENHYDRAMINE HCL 12.5 MG/5ML PO ELIX: 12.5000 mg | ORAL_SOLUTION | Freq: Four times a day (QID) | ORAL | Status: DC | PRN

## 2013-03-13 MED ORDER — ACETAMINOPHEN 500 MG PO TABS
500.0000 mg | ORAL_TABLET | Freq: Once | ORAL | Status: AC
  Administered 2013-03-13: 500 mg via ORAL
  Filled 2013-03-13: qty 1

## 2013-03-13 MED ORDER — OXYCODONE HCL 5 MG PO TABS
5.0000 mg | ORAL_TABLET | Freq: Once | ORAL | Status: AC
  Administered 2013-03-13: 5 mg via ORAL
  Filled 2013-03-13: qty 1

## 2013-03-13 MED ORDER — OXYCODONE HCL ER 10 MG PO T12A
10.0000 mg | EXTENDED_RELEASE_TABLET | Freq: Three times a day (TID) | ORAL | Status: DC
  Administered 2013-03-13 – 2013-03-16 (×9): 10 mg via ORAL
  Filled 2013-03-13 (×9): qty 1

## 2013-03-13 MED ORDER — DIPHENHYDRAMINE HCL 50 MG/ML IJ SOLN: 12.5000 mg | Freq: Four times a day (QID) | INTRAMUSCULAR | Status: DC | PRN

## 2013-03-13 NOTE — Progress Notes (Signed)
1 Day Post-Op  Subjective: Patient c/o severe pain during the night that was only marginally improved with Morphine PCA. She is ambulating a little to the restroom. She is tolerating diet.  The Right breast flap remains viable and without signs of infection.  JP drains with serosang drainage of about 310 cc overnight.   Will try Dilaudid PCA instead following discussion with Dr. Kelly Splinter.   Objective: Vital signs in last 24 hours: Temp:  [97.1 F (36.2 C)-100.4 F (38 C)] 99 F (37.2 C) (10/07 0545) Pulse Rate:  [76-117] 89 (10/07 0545) Resp:  [10-30] 25 (10/07 0545) BP: (131-175)/(54-90) 131/78 mmHg (10/07 0545) SpO2:  [90 %-100 %] 98 % (10/07 0545) Weight:  [120.742 kg (266 lb 3 oz)-130.9 kg (288 lb 9.3 oz)] 130.9 kg (288 lb 9.3 oz) (10/06 1850)    Intake/Output from previous day: 10/06 0701 - 10/07 0700 In: 3830 [I.V.:3545; IV Piggyback:200] Out: 1440 [Urine:1130; Drains:310] Intake/Output this shift:    General appearance: alert, cooperative, appears stated age, mild distress and moderate distress Resp: clear to auscultation bilaterally Cardio: regular rate and rhythm The right breast flap is viable and without signs of infection. The back incision is clean, dry and intact. The JP drains are draining serosang fluid  Lab Results:   Recent Labs  03/12/13 1947  WBC 12.8*  HGB 11.9*  HCT 34.1*  PLT 248   BMET  Recent Labs  03/12/13 1947  NA 139  K 3.4*  CL 104  CO2 22  GLUCOSE 144*  BUN 8  CREATININE 0.54  CALCIUM 8.7   PT/INR No results found for this basename: LABPROT, INR,  in the last 72 hours ABG No results found for this basename: PHART, PCO2, PO2, HCO3,  in the last 72 hours  Studies/Results: No results found.  Anti-infectives: Anti-infectives   Start     Dose/Rate Route Frequency Ordered Stop   03/13/13 0000  ciprofloxacin (CIPRO) IVPB 400 mg     400 mg 200 mL/hr over 60 Minutes Intravenous Every 12 hours 03/12/13 1840     03/12/13 1245   ciprofloxacin (CIPRO) IVPB 400 mg  Status:  Discontinued     400 mg 200 mL/hr over 60 Minutes Intravenous To Surgery 03/12/13 1242 03/12/13 1832      Assessment/Plan: s/p Procedure(s): RIGHT BREAST LATISSIMUS FLAP WITH EXPANDER PLACEMENT (Right) Status post right latissimus flap with expander placement for reconstruction of right breast- POD #1 Will change to Dilaudid PCA Start Oxycontin SR 10 mg q 8 hours Will add Robaxin po Progress with ambulation    LOS: 1 day    Karen Hopkins 03/13/2013 Plastic Surgery [phone]

## 2013-03-13 NOTE — Progress Notes (Signed)
INITIAL NUTRITION ASSESSMENT  DOCUMENTATION CODES Per approved criteria  -Morbid Obesity   INTERVENTION: Provide Ensure Complete BID until appetite improves Provide Multivitamin with minerals daily to promote wound healing  NUTRITION DIAGNOSIS: Inadequate oral intake related to decreased appetite as evidenced by pt eating 0-25% of meals and reported poor PO intake 2 weeks PTA.   Goal: Pt to meet >/= 90% of their estimated nutrition needs   Monitor:  PO intake Weight  Reason for Assessment: Malnutrition Screening Tool, score of 2  37 y.o. female  Admitting Dx: <principal problem not specified>  ASSESSMENT: 37 yrs old bf here for history and physical for right breast reconstruction with a latissimus and expander reconstruction.   Pt reports having a decreased appetite for 2 weeks PTA; she was given antibiotics and prednisone which caused her stomach to feel weird, decreased appetite and decreased PO intake. Pt reports she recently lost down to 265 lbs. She reports pain and gas/bloating today; she was only able to eat 25% of breakfast and was unable to eat lunch. Pt sipping on coke; advised pt to avoid carbonated beverages as it can make gas worse but, pt states it helps her to burp and relieve gas.   Height: Ht Readings from Last 1 Encounters:  03/12/13 5\' 4"  (1.626 m)    Weight: Wt Readings from Last 1 Encounters:  03/12/13 288 lb 9.3 oz (130.9 kg)    Ideal Body Weight: 120 lbs  % Ideal Body Weight: 206%  Wt Readings from Last 10 Encounters:  03/12/13 288 lb 9.3 oz (130.9 kg)  03/12/13 288 lb 9.3 oz (130.9 kg)  03/09/13 268 lb (121.564 kg)  03/05/13 266 lb 3.2 oz (120.748 kg)  02/22/13 263 lb (119.296 kg)  02/21/13 266 lb (120.657 kg)  01/17/13 267 lb 3.2 oz (121.201 kg)  01/12/13 262 lb (118.842 kg)  11/28/12 270 lb (122.471 kg)  09/27/12 265 lb (120.203 kg)    Usual Body Weight: 265 lbs  % Usual Body Weight: 109%  BMI:  Body mass index is 49.51  kg/(m^2).  Estimated Nutritional Needs: Kcal: 2000-2200 Protein: 110-120 grams Fluid: 3.1 L/day  Skin: generalized edema, non-pitting RUE edema; incision on right breast and right back  Diet Order: General  EDUCATION NEEDS: -No education needs identified at this time   Intake/Output Summary (Last 24 hours) at 03/13/13 1554 Last data filed at 03/13/13 1050  Gross per 24 hour  Intake   1830 ml  Output   1325 ml  Net    505 ml    Last BM: 10/5  Labs:   Recent Labs Lab 03/12/13 1947  NA 139  K 3.4*  CL 104  CO2 22  BUN 8  CREATININE 0.54  CALCIUM 8.7  GLUCOSE 144*    CBG (last 3)   Recent Labs  03/12/13 1000 03/12/13 1149  GLUCAP 124* 95    Scheduled Meds: . ALPRAZolam  0.5 mg Oral BID  . ciprofloxacin  400 mg Intravenous Q12H  . citalopram  20 mg Oral Daily  . docusate sodium  100 mg Oral TID  . HYDROmorphone PCA 0.3 mg/mL   Intravenous Q4H  . methocarbamol  500 mg Oral QID  . OxyCODONE  10 mg Oral Q8H  . tamoxifen  20 mg Oral BID  . triamterene-hydrochlorothiazide  1 tablet Oral Daily    Continuous Infusions: . dextrose 5 % and 0.45 % NaCl with KCl 20 mEq/L 100 mL/hr at 03/12/13 2351    Past Medical History  Diagnosis  Date  . Breast cancer stage 3    radiation/chemo/ right mastectomy  . Seasonal allergies   . Hypertension   . Lymphedema of arm right   . Depression   . Anxiety     Past Surgical History  Procedure Laterality Date  . Mastectomy  right sided complete  . Hernia repair  2006  . Nasal septum surgery      Ian Malkin RD, LDN Inpatient Clinical Dietitian Pager: [phone] After Hours Pager: [phone]

## 2013-03-13 NOTE — Progress Notes (Signed)
Patient HR in the 130-140 at rest in the bed. Patient c/o 10/10 pain when moving. 6/10 at rest. PCA pump alarming and making patient and family frustrated. Apical HR 141 at rest. Dr. Kelly Splinter made aware and orders given. Will carry out orders and continue to monitor and call Dr Kelly Splinter with update per her request.

## 2013-03-13 NOTE — Progress Notes (Signed)
Dr Kelly Splinter called about pt still elevated HR and temp of 102.7. Orders given. Patient informed. Will continue to monitor.

## 2013-03-14 ENCOUNTER — Encounter: Payer: No Typology Code available for payment source | Admitting: Obstetrics & Gynecology

## 2013-03-14 MED ORDER — OXYCODONE HCL 5 MG PO TABS
10.0000 mg | ORAL_TABLET | ORAL | Status: DC | PRN
  Administered 2013-03-14 – 2013-03-16 (×10): 10 mg via ORAL
  Filled 2013-03-14 (×10): qty 2

## 2013-03-14 MED ORDER — HYDROMORPHONE HCL PF 1 MG/ML IJ SOLN
1.0000 mg | INTRAMUSCULAR | Status: AC | PRN
  Administered 2013-03-14 – 2013-03-15 (×2): 1 mg via INTRAVENOUS
  Filled 2013-03-14 (×2): qty 1

## 2013-03-14 MED ORDER — ACETAMINOPHEN 325 MG PO TABS
650.0000 mg | ORAL_TABLET | Freq: Once | ORAL | Status: AC
  Administered 2013-03-14: 650 mg via ORAL
  Filled 2013-03-14: qty 2

## 2013-03-14 MED ORDER — IBUPROFEN 600 MG PO TABS
600.0000 mg | ORAL_TABLET | Freq: Once | ORAL | Status: AC
  Administered 2013-03-14: 600 mg via ORAL
  Filled 2013-03-14: qty 1

## 2013-03-14 NOTE — Progress Notes (Addendum)
Pt's has temp of 102.7.  650mg  Tylenol PO given and pt up in chair.  Dr. Kelly Splinter notified and new orders received.  Will continue to monitor and pass information to next shift RN.  Karen Hopkins

## 2013-03-14 NOTE — Progress Notes (Signed)
Agree with the above information 

## 2013-03-14 NOTE — Progress Notes (Signed)
2 Days Post-Op  Subjective: The patient is 2 days post operative and doing very well.  She is complaining of pain which is being managed with oral and IV medications.  Objective: Vital signs in last 24 hours: Temp:  [98.5 F (36.9 C)-102.7 F (39.3 C)] 99.1 F (37.3 C) (10/08 0527) Pulse Rate:  [112-136] 124 (10/08 0527) Resp:  [15-33] 20 (10/08 0741) BP: (130-151)/(55-92) 145/55 mmHg (10/08 0527) SpO2:  [91 %-98 %] 95 % (10/08 0741) Last BM Date: 03/11/13  Intake/Output from previous day: 10/07 0701 - 10/08 0700 In: 2120 [P.O.:480; I.V.:1540; IV Piggyback:100] Out: 620 [Urine:350; Drains:270] Intake/Output this shift:    General appearance: alert, cooperative and no distress Incision/Wound:flap warm and with good capillary refill.  Lab Results:   Recent Labs  03/12/13 1947  WBC 12.8*  HGB 11.9*  HCT 34.1*  PLT 248   BMET  Recent Labs  03/12/13 1947  NA 139  K 3.4*  CL 104  CO2 22  GLUCOSE 144*  BUN 8  CREATININE 0.54  CALCIUM 8.7   PT/INR No results found for this basename: LABPROT, INR,  in the last 72 hours ABG No results found for this basename: PHART, PCO2, PO2, HCO3,  in the last 72 hours  Studies/Results: No results found.  Anti-infectives: Anti-infectives   Start     Dose/Rate Route Frequency Ordered Stop   03/13/13 0000  ciprofloxacin (CIPRO) IVPB 400 mg     400 mg 200 mL/hr over 60 Minutes Intravenous Every 12 hours 03/12/13 1840     03/12/13 1245  ciprofloxacin (CIPRO) IVPB 400 mg  Status:  Discontinued     400 mg 200 mL/hr over 60 Minutes Intravenous To Surgery 03/12/13 1242 03/12/13 1832      Assessment/Plan: s/p Procedure(s): RIGHT BREAST LATISSIMUS FLAP WITH EXPANDER PLACEMENT (Right) Plan for discharge tomorrow  LOS: 2 days   Discontinue the PCA and add oxycodone.   SANGER,Yasmin Dibello 03/14/2013

## 2013-03-14 NOTE — Progress Notes (Signed)
Pt states she has a severe headache, is nauseated and pain medicine is not working.  Dr. Kelly Splinter notified and new orders received.  Hector Shade [place]

## 2013-03-15 LAB — CBC
HCT: 32.3 % — ABNORMAL LOW (ref 36.0–46.0)
Hemoglobin: 11.1 g/dL — ABNORMAL LOW (ref 12.0–15.0)
MCH: 30 pg (ref 26.0–34.0)
MCHC: 34.4 g/dL (ref 30.0–36.0)
Platelets: 260 10*3/uL (ref 150–400)
RBC: 3.7 MIL/uL — ABNORMAL LOW (ref 3.87–5.11)
RDW: 12.9 % (ref 11.5–15.5)
WBC: 15.1 10*3/uL — ABNORMAL HIGH (ref 4.0–10.5)

## 2013-03-15 LAB — URINE CULTURE
Colony Count: NO GROWTH
Culture: NO GROWTH
Special Requests: NORMAL

## 2013-03-15 MED ORDER — POLYETHYLENE GLYCOL 3350 17 G PO PACK
17.0000 g | PACK | Freq: Every day | ORAL | Status: DC
  Administered 2013-03-15 – 2013-03-16 (×2): 17 g via ORAL
  Filled 2013-03-15 (×2): qty 1

## 2013-03-15 MED ORDER — MAGNESIUM HYDROXIDE 400 MG/5ML PO SUSP
15.0000 mL | Freq: Every day | ORAL | Status: DC | PRN
  Administered 2013-03-15: 15 mL via ORAL
  Filled 2013-03-15: qty 30

## 2013-03-15 MED ORDER — TAMOXIFEN CITRATE 10 MG PO TABS
20.0000 mg | ORAL_TABLET | Freq: Every day | ORAL | Status: DC
  Administered 2013-03-16: 20 mg via ORAL
  Filled 2013-03-15: qty 2

## 2013-03-15 MED ORDER — SODIUM CHLORIDE 0.45 % IV BOLUS
500.0000 mL | Freq: Once | INTRAVENOUS | Status: AC
  Administered 2013-03-15: 500 mL via INTRAVENOUS

## 2013-03-15 NOTE — Progress Notes (Signed)
3 Days Post-Op  Subjective: Patient has been febrile to 102.9 last pm. Temp this am is 99.5. She is currently receiving a dose of IV Cipro. She is tachycardic to 120's at rest. UA with wbc 0-2 but moderate bacteria. She is doing well with incentive spirometry to 2 liters. Will check CBC and continue antibiotics.   Objective: Vital signs in last 24 hours: Temp:  [98.5 F (36.9 C)-102.9 F (39.4 C)] 99.5 F (37.5 C) (10/09 1100) Pulse Rate:  [103-127] 127 (10/09 1100) Resp:  [18-20] 18 (10/09 0945) BP: (123-158)/(59-81) 136/59 mmHg (10/09 0945) SpO2:  [92 %-99 %] 97 % (10/09 1100) Last BM Date: 03/11/13  Intake/Output from previous day: 10/08 0701 - 10/09 0700 In: 3284 [P.O.:684; I.V.:2400; IV Piggyback:200] Out: 175 [Drains:175] Intake/Output this shift:    General appearance: alert, cooperative, appears stated age, mild distress and morbidly obese Resp: clear to auscultation bilaterally Cardio: regular rate and rhythm and with tachycardia to 120's The right breast flap remains viable and incisions are clean, dry and intact  Lab Results:   Recent Labs  03/12/13 1947  WBC 12.8*  HGB 11.9*  HCT 34.1*  PLT 248   BMET  Recent Labs  03/12/13 1947  NA 139  K 3.4*  CL 104  CO2 22  GLUCOSE 144*  BUN 8  CREATININE 0.54  CALCIUM 8.7   PT/INR No results found for this basename: LABPROT, INR,  in the last 72 hours ABG No results found for this basename: PHART, PCO2, PO2, HCO3,  in the last 72 hours  Studies/Results: No results found.  Anti-infectives: Anti-infectives   Start     Dose/Rate Route Frequency Ordered Stop   03/13/13 0000  ciprofloxacin (CIPRO) IVPB 400 mg     400 mg 200 mL/hr over 60 Minutes Intravenous Every 12 hours 03/12/13 1840     03/12/13 1245  ciprofloxacin (CIPRO) IVPB 400 mg  Status:  Discontinued     400 mg 200 mL/hr over 60 Minutes Intravenous To Surgery 03/12/13 1242 03/12/13 1832      Assessment/Plan: s/p Procedure(s): RIGHT BREAST  LATISSIMUS FLAP WITH EXPANDER PLACEMENT (Right) Continue antibiotics Check CBC Give small fluid bolus Likely discharge in am if remains afebrile  LOS: 3 days    Bodhi Moradi, PA-C 03/15/2013 Plastic surgery [phone]

## 2013-03-16 LAB — CBC
MCH: 29.7 pg (ref 26.0–34.0)
MCV: 87.2 fL (ref 78.0–100.0)
Platelets: 247 10*3/uL (ref 150–400)
RBC: 3.37 MIL/uL — ABNORMAL LOW (ref 3.87–5.11)
RDW: 13 % (ref 11.5–15.5)
WBC: 11.8 10*3/uL — ABNORMAL HIGH (ref 4.0–10.5)

## 2013-03-16 MED ORDER — POLYETHYLENE GLYCOL 3350 17 G PO PACK: 17.0000 g | PACK | Freq: Every day | ORAL | Status: DC

## 2013-03-16 MED ORDER — MAGNESIUM HYDROXIDE 400 MG/5ML PO SUSP: 15.0000 mL | Freq: Every day | ORAL | Status: DC | PRN

## 2013-03-16 MED ORDER — OXYCODONE HCL 10 MG PO TABS: 10.0000 mg | ORAL_TABLET | ORAL | Status: DC | PRN

## 2013-03-16 MED ORDER — DSS 100 MG PO CAPS: 100.0000 mg | ORAL_CAPSULE | Freq: Three times a day (TID) | ORAL | Status: DC

## 2013-03-16 MED ORDER — MORPHINE SULFATE ER 10 MG PO CP24: 10.0000 mg | ORAL_CAPSULE | Freq: Two times a day (BID) | ORAL | Status: DC

## 2013-03-16 MED ORDER — ADULT MULTIVITAMIN W/MINERALS CH: 1.0000 | ORAL_TABLET | Freq: Every day | ORAL | Status: DC

## 2013-03-16 MED ORDER — ENSURE COMPLETE PO LIQD: 237.0000 mL | Freq: Two times a day (BID) | ORAL | Status: DC

## 2013-03-16 NOTE — Progress Notes (Signed)
Patient discharged to home with instructions. 

## 2013-03-16 NOTE — Progress Notes (Signed)
UR completed.  Moriah Loughry, RN BSN [facility] CCM Trauma/Neuro ICU Case Manager [phone]  

## 2013-03-17 NOTE — Progress Notes (Signed)
Agree with the above

## 2013-03-19 NOTE — Discharge Summary (Signed)
Physician Discharge Summary  Patient ID: Karen Hopkins MRN: [id number] DOB/AGE: December 21, 1975 37 y.o.  Admit date: 03/12/2013 Discharge date: 03/19/2013  Admission Diagnoses: Acquired absence of right breast and nipple due to breast cancer  Discharge Diagnoses:  Active Problems:   * No active hospital problems. *   Discharged Condition: good  Hospital Course: The patient is a 37 yrs old bf admitted for right breast reconstruction with a latissimus and expander reconstruction.   History:  The patient had pain and skin thickening with erythema in the right breast in early 2011.  She was initially treated with Bactrim with the thought of infection due to her recent delivery of her child.  The antibiotic was not well tolerated and did not improve the condition so a mammogram was done.  The mammogram showed scattered fibroglandular densities, and an asymmetric density in the right upper outer quadrant with suspicious microcalcifications. There were also enlarged right axillary lymph nodes. The left was normal. The ultrasound showed an ill defined area of hypoechoic tissue in the right upper outer quadrant and abnormal right axillary lymph nodes. The ultrasound guided biopsy showed a poorly differentiated invasive ductal carcinoma, which was ER poor at 3%, PR positive at 14% with an elevated proliferation marker at 50%, with strong amplification of HER-2 by CISH with a ratio of 5.20.    Bilateral breast MRIs were obtained 09/02/2009 and showed an area 10.6 cm in the right breast showing confluent mass and non-mass enhancement. There was marked skin thickening involving the entire right breast, and numerous bulky right axillary lymph nodes were identified. The left breast was unremarkable, and there was no evidence of internal mammary lymph node involvement.    Patient was treated in the neoadjuvant setting with 4 dose dense cycles of doxorubicin and cyclophosphamide, followed by paclitaxel and  trastuzumab. She underwent definitive right modified radical mastectomy in 02/2010, with a residual 2.5 mm area of tumor in the breast, grade 3, and one of 21 lymph nodes involved. She finished radiation in 2/21012. She was treated for right arm lymphedema which has improved. The skin shows signs of radiation but not damaged. There is some hyperpigmentation. Her abdomen is very larger.  She had an abdominal hernia repair in the past. She has had issues with chronic pain following the mastectomy.   She was admitted and underwent right latissimus myocutaneous flap reconstruction with tissue expander placement to the right breast without complications on 03/12/13 per Dr. Kelly Splinter. She had issues with pain control post operatively. She developed some fevers and slight tachycardia and leukocytosis. She was continued on IV antibiotics and hydrated and improved and was discharged home 03/16/13 in stable and improved condition. She will follow up next week in the office.   Consults: None  Significant Diagnostic Studies: None  Treatments: surgery: 03/12/13 per Dr. Kelly Splinter PROCEDURE: 1. Latissimus flap on the right to reconstruct the breast CPT 19361 2. Tissue expander placement CPT 19357 A 350 cc expander was chosen. It was soaked in triple antibiotic solution and evacuated of air and then filled with 100 cc of sterile saline.   Discharge Exam: Blood pressure 137/74, pulse 99, temperature 99 F (37.2 C), temperature source Oral, resp. rate 22, height 5\' 4"  (1.626 m), weight 130.9 kg (288 lb 9.3 oz), last menstrual period 03/11/2013, SpO2 98.00%. General appearance: alert, cooperative, mild distress and morbidly obese Back: Incision is clean, dry and intact and without signs of infection Resp: clear to auscultation bilaterally Cardio: regular rate and rhythm Breast  flap is viable, incisions clean, dry and intact and without signs of infection. JP drains intact and with serosang drainage  Disposition: 01-Home  or Self Care  Discharge Orders   Future Appointments Provider Department Dept Phone   04/09/2013 9:30 AM Su Monks, PA-C [facility] [phone]   04/19/2013 10:15 AM Delcie Roch [facility] [phone]   04/19/2013 10:45 AM Amy Allegra Grana, PA-C [facility] [phone]   Future Orders Complete By Expires   Care order/instruction  As directed    Comments:     Please provide sheet to record drain amounts and instruct how to empty JP drains       Medication List    STOP taking these medications       HYDROcodone-acetaminophen 5-325 MG per tablet  Commonly known as:  NORCO      TAKE these medications       albuterol 108 (90 BASE) MCG/ACT inhaler  Commonly known as:  PROVENTIL HFA;VENTOLIN HFA  Inhale 2 puffs into the lungs every 6 (six) hours as needed for wheezing.     ALPRAZolam 1 MG 24 hr tablet  Commonly known as:  XANAX XR  Take 1 tablet (1 mg total) by mouth daily.     citalopram 20 MG tablet  Commonly known as:  CELEXA  Take 1 tablet (20 mg total) by mouth daily.     diclofenac sodium 1 % Gel  Commonly known as:  VOLTAREN  Apply 2 g topically 4 (four) times daily.     DSS 100 MG Caps  Take 100 mg by mouth 3 (three) times daily.     feeding supplement (ENSURE COMPLETE) Liqd  Take 237 mLs by mouth 2 (two) times daily between meals.     magnesium hydroxide 400 MG/5ML suspension  Commonly known as:  MILK OF MAGNESIA  Take 15 mLs by mouth daily as needed.     MELATONIN PO  Take 10 mg by mouth at bedtime.     morphine 10 MG 24 hr capsule  Commonly known as:  KADIAN  Take 1 capsule (10 mg total) by mouth every 12 (twelve) hours.     multivitamin with minerals Tabs tablet  Take 1 tablet by mouth daily.     naproxen 500 MG tablet  Commonly known as:  NAPROSYN  Take 500 mg by mouth 3 (three) times daily as needed. headache     Oxycodone HCl 10 MG Tabs   Take 1 tablet (10 mg total) by mouth every 4 (four) hours as needed (for break through pain).     polyethylene glycol packet  Commonly known as:  MIRALAX / GLYCOLAX  Take 17 g by mouth daily.     tamoxifen 20 MG tablet  Commonly known as:  NOLVADEX  Take 1 tablet (20 mg total) by mouth daily.     traMADol 50 MG tablet  Commonly known as:  ULTRAM  Take 1 tablet (50 mg total) by mouth every 6 (six) hours as needed for pain.     triamterene-hydrochlorothiazide 37.5-25 MG per tablet  Commonly known as:  MAXZIDE-25  Take 1 tablet by mouth daily.           Follow-up Information   Follow up with [facility], DO. Schedule an appointment as soon as possible for a visit on 03/20/2013. (call to make your appointment for next week)    Specialty:  Plastic Surgery   Contact information:  [address] [place] [address] [phone]       Signed: Franki Monte 03/19/2013, 5:48 PM Plastic Surgery [phone]

## 2013-03-20 NOTE — Discharge Summary (Signed)
Agree with the above information 

## 2013-04-03 ENCOUNTER — Other Ambulatory Visit: Payer: Self-pay | Admitting: *Deleted

## 2013-04-03 MED ORDER — TRAMADOL HCL 50 MG PO TABS: 50.0000 mg | ORAL_TABLET | Freq: Four times a day (QID) | ORAL | Status: DC | PRN

## 2013-04-09 ENCOUNTER — Encounter
Payer: Medicare Other | Attending: Physical Medicine and Rehabilitation | Admitting: Physical Medicine and Rehabilitation

## 2013-04-19 ENCOUNTER — Encounter: Payer: Self-pay | Admitting: Physician Assistant

## 2013-04-19 ENCOUNTER — Telehealth: Payer: Self-pay | Admitting: Oncology

## 2013-04-19 ENCOUNTER — Ambulatory Visit (HOSPITAL_BASED_OUTPATIENT_CLINIC_OR_DEPARTMENT_OTHER): Payer: Medicare Other | Admitting: Physician Assistant

## 2013-04-19 ENCOUNTER — Other Ambulatory Visit (HOSPITAL_BASED_OUTPATIENT_CLINIC_OR_DEPARTMENT_OTHER): Payer: Medicare Other | Admitting: Lab

## 2013-04-19 VITALS — BP 134/89 | HR 92 | Temp 98.4°F | Resp 18 | Ht 64.0 in | Wt 265.1 lb

## 2013-04-19 DIAGNOSIS — F411 Generalized anxiety disorder: Secondary | ICD-10-CM

## 2013-04-19 DIAGNOSIS — G8929 Other chronic pain: Secondary | ICD-10-CM | POA: Insufficient documentation

## 2013-04-19 DIAGNOSIS — E876 Hypokalemia: Secondary | ICD-10-CM

## 2013-04-19 DIAGNOSIS — I890 Lymphedema, not elsewhere classified: Secondary | ICD-10-CM

## 2013-04-19 DIAGNOSIS — C50911 Malignant neoplasm of unspecified site of right female breast: Secondary | ICD-10-CM

## 2013-04-19 DIAGNOSIS — Z170 Estrogen receptor positive status [ER+]: Secondary | ICD-10-CM

## 2013-04-19 DIAGNOSIS — C773 Secondary and unspecified malignant neoplasm of axilla and upper limb lymph nodes: Secondary | ICD-10-CM

## 2013-04-19 DIAGNOSIS — Z1231 Encounter for screening mammogram for malignant neoplasm of breast: Secondary | ICD-10-CM

## 2013-04-19 DIAGNOSIS — C50419 Malignant neoplasm of upper-outer quadrant of unspecified female breast: Secondary | ICD-10-CM

## 2013-04-19 DIAGNOSIS — G8928 Other chronic postprocedural pain: Secondary | ICD-10-CM

## 2013-04-19 LAB — CBC WITH DIFFERENTIAL/PLATELET
BASO%: 0.4 % (ref 0.0–2.0)
EOS%: 3.8 % (ref 0.0–7.0)
HCT: 38.4 % (ref 34.8–46.6)
MCH: 28.8 pg (ref 25.1–34.0)
MCHC: 32.6 g/dL (ref 31.5–36.0)
MONO#: 0.6 10*3/uL (ref 0.1–0.9)
NEUT#: 4.8 10*3/uL (ref 1.5–6.5)
Platelets: 265 10*3/uL (ref 145–400)
RDW: 14.2 % (ref 11.2–14.5)
WBC: 8.6 10*3/uL (ref 3.9–10.3)
lymph#: 2.9 10*3/uL (ref 0.9–3.3)

## 2013-04-19 LAB — COMPREHENSIVE METABOLIC PANEL (CC13)
AST: 13 U/L (ref 5–34)
Albumin: 3.5 g/dL (ref 3.5–5.0)
Alkaline Phosphatase: 59 U/L (ref 40–150)
Anion Gap: 10 mEq/L (ref 3–11)
Calcium: 9.7 mg/dL (ref 8.4–10.4)
Chloride: 106 mEq/L (ref 98–109)
Glucose: 102 mg/dl (ref 70–140)
Potassium: 3.2 mEq/L — ABNORMAL LOW (ref 3.5–5.1)
Sodium: 142 mEq/L (ref 136–145)
Total Protein: 7.7 g/dL (ref 6.4–8.3)

## 2013-04-19 MED ORDER — POTASSIUM CHLORIDE CRYS ER 20 MEQ PO TBCR: 20.0000 meq | EXTENDED_RELEASE_TABLET | Freq: Every day | ORAL | Status: DC

## 2013-04-19 MED ORDER — TRAMADOL HCL 50 MG PO TABS: 50.0000 mg | ORAL_TABLET | Freq: Four times a day (QID) | ORAL | Status: DC | PRN

## 2013-04-19 NOTE — Progress Notes (Signed)
ID: Brycelynn Stampley Cain   DOB: 1976-03-27  MR#: [id number]  FAO#:[id number]  PCP: Levert Feinstein, MD GYN: SU: Cicero Duck, MD;  Wayland Denis, MD OTHER MD: Claudette Laws, MD;  Rodman Pickle, MD  CHIEF COMPLAINT:  Right Breast Cancer   HISTORY OF PRESENT ILLNESS: The patient noted swelling and redness, as well as some tenderness, over her right breast for some time. She thought this might be related to pregnancy, since she delivered her third child about 8 months ago. As the problem did not improve, she brought it to her primary care physician's attention. He treated her with Bactrim, which she says she could not tolerate because of nausea and vomiting, but which in any case did not resolve the problem, so he set her up for mammography at [facility] 08/26/2009. Dr. Deboraha Sprang found by exam marked skin thickening and erythema of the right breast extending pretty much throughout the breast. By mammography there were scattered fibroglandular densities, and an asymmetric density in the right upper outer quadrant with suspicious microcalcifications. There were also enlarged right axillary lymph nodes. The left appeared normal. By ultrasound there was an ill defined area of hypoechoic tissue in the right upper outer quadrant that was difficult to measure. There were also abnormal right axillary lymph nodes.  Ultrasound guided biopsy was performed the same day, and showed [phone] a poorly differentiated invasive ductal carcinoma, which was estrogen receptor poor at 3%, progesterone receptor positive at 14% with an elevated proliferation marker at 50%, but importantly with strong amplification of HER-2 by CISH with a ratio of 5.20.  Bilateral breast MRIs were obtained 09/02/2009. This showed an area up to 10.6 cm in the right breast showing confluent mass and non-mass enhancement. There was marked skin thickening involving the entire right breast, and numerous bulky right axillary lymph nodes were  identified. The left breast was unremarkable, and there was no evidence of internal mammary lymph node involvement.  Patient was treated in the neoadjuvant setting with 4 dose dense cycles of doxorubicin and cyclophosphamide, followed by 12 weekly doses of paclitaxel and trastuzumab. Ayleah underwent definitive right modified radical mastectomy in October 2011, with a residual 2.5 mm area of tumor in the breast, grade 3, and one of 21 lymph nodes involved.   Her subsequent history is as detailed below  INTERVAL HISTORY: Desera returns today for followup of her right breast carcinoma, for which she continues on tamoxifen daily. She continues to have hot flashes, but otherwise is tolerating the tamoxifen well.  Interval history is notable for Matina having had right-sided reconstructive surgery on October 6 under the care of Dr. Kelly Splinter with latissimus flap and expander. She just had the expander "filled" for the first time this week. She is having increased pain in the right chest wall, as well as the right upper back in the area of the latissimus incision.  In fact, pain continues to be one of Amijah is biggest complaints today. She continues to be followed through Dr.  Wynn Banker' office for chronic pain. Unfortunately, she apparently missed her last appointment there due to her surgery. She is planning to call to reschedule that appointment. She's also seen as needed at the lymphedema clinic, and continues to use her compression sleeve for chronic right upper extremity lymphedema.  I will also mention that Suprina's primary care physician is following her for some elevated glucose readings. The highest reading at her preop appointment was over 200, but Virginie had been on steroids, and had also  been eating candy in the waiting room prior to the appointment. A hemoglobin A1c on 03/09/2013 was 6.4. This is apparently being followed by Dr. Rodman Pickle.  Tylicia tells me today she would like to avoid  diabetes medication, and would like to meet with a diabetic educator to discuss control through diet. I encouraged her to address this further with her primary care physician.  REVIEW OF SYSTEMS: Evanny  denies any recent fevers or chills. She has had no skin changes and has had no abnormal bruising or bleeding. She's still having irregular menstrual cycles, with her last menstrual period being in early October. She denies any vaginal dryness or discharge.She is in the process of scheduling her pelvic exam which has also been delayed secondary to her recent surgery.  She's eating and drinking well, has had no nausea or emesis, and denies any change in bowel or bladder habits. She had constipation during her hospitalization in October, but that resolved. She has shortness of breath with exertion which is stable.  She's had no increased cough or phlegm production. She denies any chest pain or palpitations. She's had no abnormal headaches, dizziness or change in vision. She denies any new peripheral swelling, other than the chronic right upper extremity lymphedema. She continues to have quite a bit of anxiety for which she continues on Xanax. She is still planning on making an appointment through [facility].    A detailed review of systems is otherwise stable and noncontributory.     PAST MEDICAL HISTORY: Past Medical History  Diagnosis Date  . Breast cancer stage 3    radiation/chemo/ right mastectomy  . Seasonal allergies   . Hypertension   . Lymphedema of arm right   . Depression   . Anxiety     PAST SURGICAL HISTORY: Past Surgical History  Procedure Laterality Date  . Mastectomy  right sided complete  . Hernia repair  2006  . Nasal septum surgery    . Latissimus flap to breast Right 03/12/2013    Procedure: RIGHT BREAST LATISSIMUS FLAP WITH EXPANDER PLACEMENT;  Surgeon: Wayland Denis, DO;  Location: [facility] OR;  Service: Plastics;  Laterality: Right;    FAMILY  HISTORY Family History  Problem Relation Age of Onset  . Hypertension Mother   . Diabetes type II Father   . Prostate cancer Father   There is no history of breast or ovarian cancer in the immediate family, but of the patient's mother's mother's six sisters, two (the patient's great-aunts) had ovarian cancer.  GYNECOLOGIC HISTORY: The patient is GX, P3. First child was premature. Age at first delivery, 37 years old.   SOCIAL HISTORY: (Updated December 2013) She is currently disabled. Anadia's children are [name], and [name], aged 58, 8, and 3.. They are all boys, all at home. The patient attends a non-denominational church in [place], which she considers her home, but she is currently living in [place].   ADVANCED DIRECTIVES:  HEALTH MAINTENANCE:  (Updated November 2014) History  Substance Use Topics  . Smoking status: Former Smoker -- 0.25 packs/day for 5 years    Types: Cigarettes    Quit date: 04/26/2010  . Smokeless tobacco: Never Used  . Alcohol Use: No    Colonoscopy: Never  PAP: 2012  Bone density: Never  Lipid panel: August 2014    Allergies  Allergen Reactions  . Lyrica [Pregabalin] Nausea Only    .  Marland Kitchen Meloxicam Nausea Only  . Penicillins Swelling    REACTION: Facial swelling  .  Robaxin [Methocarbamol] Nausea Only    Current Outpatient Prescriptions  Medication Sig Dispense Refill  . albuterol (PROVENTIL HFA;VENTOLIN HFA) 108 (90 BASE) MCG/ACT inhaler Inhale 2 puffs into the lungs every 6 (six) hours as needed for wheezing.  1 Inhaler  2  . ALPRAZolam (XANAX XR) 1 MG 24 hr tablet Take 1 tablet (1 mg total) by mouth daily.  30 tablet  3  . citalopram (CELEXA) 20 MG tablet Take 1 tablet (20 mg total) by mouth daily.  30 tablet  5  . diclofenac sodium (VOLTAREN) 1 % GEL Apply 2 g topically 4 (four) times daily.  2 Tube  2  . docusate sodium 100 MG CAPS Take 100 mg by mouth 3 (three) times daily.  10 capsule  0  . magnesium hydroxide (MILK OF  MAGNESIA) 400 MG/5ML suspension Take 15 mLs by mouth daily as needed.  360 mL  0  . MELATONIN PO Take 10 mg by mouth at bedtime.       . Multiple Vitamin (MULTIVITAMIN WITH MINERALS) TABS tablet Take 1 tablet by mouth daily.      . polyethylene glycol (MIRALAX / GLYCOLAX) packet Take 17 g by mouth daily.  14 each  0  . tamoxifen (NOLVADEX) 20 MG tablet Take 1 tablet (20 mg total) by mouth daily.  90 tablet  3  . traMADol (ULTRAM) 50 MG tablet Take 1 tablet (50 mg total) by mouth every 6 (six) hours as needed.  30 tablet  0  . triamterene-hydrochlorothiazide (MAXZIDE-25) 37.5-25 MG per tablet Take 1 tablet by mouth daily.  30 tablet  3  . naproxen (NAPROSYN) 500 MG tablet Take 500 mg by mouth 3 (three) times daily as needed. headache      . potassium chloride SA (K-DUR,KLOR-CON) 20 MEQ tablet Take 1 tablet (20 mEq total) by mouth daily.  30 tablet  5   No current facility-administered medications for this visit.    OBJECTIVE: Young [place] American woman who appears tired but is in no acute distress Filed Vitals:   04/19/13 1058  BP: 134/89  Pulse: 92  Temp: 98.4 F (36.9 C)  Resp: 18     Body mass index is 45.48 kg/(m^2).    ECOG FS: 2   Filed Weights   04/19/13 1058  Weight: 265 lb 1.6 oz (120.249 kg)   Physical Exam: HEENT:  Sclerae anicteric.  Oropharynx clear. Buccal mucosa is pink and moist. Nodes:  No cervical or supraclavicular lymphadenopathy palpated.  Breast Exam: Patient status post right mastectomy with recent reconstruction. Expander is in place. Incisions are healing well. There is no erythema or drainage, and no evidence of infection. Also, no evidence of local recurrence. Left breast is unremarkable. Axillae are benign bilaterally, no palpable adenopathy.  Lungs:  Clear to auscultation bilaterally.  No crackles wheezes or rhonchi. Heart:  Regular rate and rhythm, no murmur Abdomen:  Soft,  obese,nontender.  Positive bowel sounds.  Musculoskeletal: Diffuse spinal  tenderness to  palpation. Limited range of motion in the right upper extremity secondary to pain and swelling Extremities:  Lymphedema noted in the right upper extremity, with compression sleeve in place. No additional peripheral edema noted Neuro:  Nonfocal. Well oriented with anxious affect     LAB RESULTS: Lab Results  Component Value Date   WBC 8.6 04/19/2013   NEUTROABS 4.8 04/19/2013   HGB 12.5 04/19/2013   HCT 38.4 04/19/2013   MCV 88.3 04/19/2013   PLT 265 04/19/2013  Chemistry      Component Value Date/Time   NA 142 04/19/2013 1038   NA 139 03/12/2013 1947   K 3.2* 04/19/2013 1038   K 3.4* 03/12/2013 1947   CL 104 03/12/2013 1947   CL 105 09/27/2012 1044   CO2 26 04/19/2013 1038   CO2 22 03/12/2013 1947   BUN 9.4 04/19/2013 1038   BUN 8 03/12/2013 1947   CREATININE 0.8 04/19/2013 1038   CREATININE 0.54 03/12/2013 1947      Component Value Date/Time   CALCIUM 9.7 04/19/2013 1038   CALCIUM 8.7 03/12/2013 1947   ALKPHOS 59 04/19/2013 1038   ALKPHOS 65 11/24/2011 0903   AST 13 04/19/2013 1038   AST 13 11/24/2011 0903   ALT 11 04/19/2013 1038   ALT 13 11/24/2011 0903   BILITOT 0.26 04/19/2013 1038   BILITOT 0.1* 11/24/2011 0903       STUDIES: Left mammogram on 10/24/2012 was unremarkable  A chest x-ray on 02/22/2013 was unremarkable.   ASSESSMENT: 37 year old BRCA1 and BRCA2 negative [place] woman, status post right breast biopsy in March 2011 for a high-grade invasive ductal carcinoma, ER positive 3%, PR positive 14%, strongly HER2/neu positive with MIB-1 of 15%. Biopsy proven lymph node involvement at presentation. Changes consistent with inflammatory carcinoma.   1. Treated neoadjuvantly, status post 4 dose-dense cycles of doxorubicin/cyclophosphamide, followed by 12 weekly doses of paclitaxel with trastuzumab completed September 2011.   2. Trastuzumab was then continued for a total of 1 year [to June 2012]. Post-treatment echo showed a well-preserved  ejection fraction.   3. Status post right modified radical mastectomy in October 2011 showing a ypT1a ypN1 invasive ductal carcinoma, grade 3.   4. postmastectomy radiation completed in January 2012  5. on tamoxifen beginning January of 2012  6. other problems include chronic postsurgical pain and chronic right upper extremity lymphedema  PLAN: With regards to her breast cancer, Sravya is actually doing quite well, with no clinical evidence of disease recurrence. She'll continue on tamoxifen as before, and we will actually start seeing her on a every 6 month basis at this point.  She'll continue to followup with her other physicians, including the pain clinic, her primary care physician, and Dr. Kelly Splinter.  She is tentatively planning on a left reduction mammoplasty in January.  She is aware that she still needs to reschedule her appointment with the pain clinic, reschedule her pelvic exam, and schedule an appointment with [facility] behavioral health.  I did not refill Shantana's oxycodone today, but did give her a short-term prescription for tramadol, 30 tablets with no refills, until she gets back in with the pain clinic.  I will also mention that she was again hypokalemic, and I am starting her on a low dose of potassium, 20 mEq daily. She is also on triamterene/HCTZ for hypertension, so we will follow her potassium level.  We'll repeat a metabolic panel in approximately 4 weeks.  Hawa's left mammogram will be due in May, and she will return to see [place] in June for routine followup. She voices understanding and agreement with this plan and will call with any changes or problems prior to that appointment.    Servando Kyllonen PA-C    04/19/2013

## 2013-04-27 ENCOUNTER — Encounter: Payer: Self-pay | Admitting: Physical Medicine and Rehabilitation

## 2013-04-27 ENCOUNTER — Encounter
Payer: Medicare Other | Attending: Physical Medicine and Rehabilitation | Admitting: Physical Medicine and Rehabilitation

## 2013-04-27 DIAGNOSIS — Z9010 Acquired absence of unspecified breast and nipple: Secondary | ICD-10-CM | POA: Insufficient documentation

## 2013-04-27 DIAGNOSIS — M25519 Pain in unspecified shoulder: Secondary | ICD-10-CM | POA: Insufficient documentation

## 2013-04-27 DIAGNOSIS — G894 Chronic pain syndrome: Secondary | ICD-10-CM

## 2013-04-27 DIAGNOSIS — C50919 Malignant neoplasm of unspecified site of unspecified female breast: Secondary | ICD-10-CM

## 2013-04-27 DIAGNOSIS — Z9889 Other specified postprocedural states: Secondary | ICD-10-CM

## 2013-04-27 DIAGNOSIS — R0789 Other chest pain: Secondary | ICD-10-CM | POA: Insufficient documentation

## 2013-04-27 DIAGNOSIS — C50911 Malignant neoplasm of unspecified site of right female breast: Secondary | ICD-10-CM

## 2013-04-27 DIAGNOSIS — Z978 Presence of other specified devices: Secondary | ICD-10-CM

## 2013-04-27 MED ORDER — OXYCODONE HCL 10 MG PO TABS: 10.0000 mg | ORAL_TABLET | Freq: Four times a day (QID) | ORAL | Status: DC | PRN

## 2013-04-27 NOTE — Patient Instructions (Signed)
Try to stretch your right shoulder as pain permits

## 2013-04-27 NOTE — Progress Notes (Signed)
Subjective:    Patient ID: Karen Hopkins, female    DOB: 26-Jan-1976, 37 y.o.   MRN: [id number]  HPI The patient complains about right shoulder pain and right sided chest pain. She has a history of a total mastectomy on the right in September 2011 and subsequently chemotherapy and radiation treatment. Her symptoms started shortly after she received those treatments. The patient reports that she had a Latissimus dorsi flap breast reconstruction of the right breast done on 03/12/13, by Dr. Kelly Splinter, she is in pain, after surgery she was prescribed Oxycodone 10 mg q 6 hrs prn pain. She would like [place] to continue with this prescription. She reports that the medication is helping to manage her pain.   Patient reported in the past, that Mobic, Flexeril and Robaxin made her feel sick, therefore she discontinued these medications.   Pain Inventory Average Pain 8 Pain Right Now 8 My pain is sharp, dull, tingling and aching  In the last 24 hours, has pain interfered with the following? General activity 6 Relation with others 6 Enjoyment of life 6 What TIME of day is your pain at its worst? daytime and evening Sleep (in general) Poor  Pain is worse with: bending and some activites Pain improves with: medication Relief from Meds: 1  Mobility how many minutes can you walk? 20 ability to climb steps?  yes do you drive?  yes  Function Do you have any goals in this area?  no  Neuro/Psych spasms confusion depression anxiety  Prior Studies Any changes since last visit?  no  Physicians involved in your care Any changes since last visit?  no   Family History  Problem Relation Age of Onset  . Hypertension Mother   . Diabetes type II Father   . Prostate cancer Father    History   Social History  . Marital Status: Single    Spouse Name: N/A    Number of Children: N/A  . Years of Education: N/A   Social History Main Topics  . Smoking status: Former Smoker -- 0.25 packs/day for 5  years    Types: Cigarettes    Quit date: 04/26/2010  . Smokeless tobacco: Never Used  . Alcohol Use: No  . Drug Use: No  . Sexual Activity: None   Other Topics Concern  . None   Social History Narrative  . None   Past Surgical History  Procedure Laterality Date  . Mastectomy  right sided complete  . Hernia repair  2006  . Nasal septum surgery    . Latissimus flap to breast Right 03/12/2013    Procedure: RIGHT BREAST LATISSIMUS FLAP WITH EXPANDER PLACEMENT;  Surgeon: Wayland Denis, DO;  Location: [facility] OR;  Service: Plastics;  Laterality: Right;   Past Medical History  Diagnosis Date  . Breast cancer stage 3    radiation/chemo/ right mastectomy  . Seasonal allergies   . Hypertension   . Lymphedema of arm right   . Depression   . Anxiety    There were no vitals taken for this visit.    Review of Systems  Musculoskeletal:       Spasms  Psychiatric/Behavioral: Positive for confusion and dysphoric mood. The patient is nervous/anxious.   All other systems reviewed and are negative.       Objective:   Physical Exam Constitutional: She is oriented to [place], place, and time. She appears well-developed.  HENT:  Head: Normocephalic.  Eyes: Pupils are equal, round, and reactive to light.  Neck:  Normal range of motion.  Neurological: She is alert and oriented to [place], place, and time.  Skin: Skin is warm and dry.  Psychiatric: She has a normal mood and affect.  Contracture of pectoralis major and minor, scar tissue, external rotation with abduction of right shoulder, restricted, abduction 80 degrees, external rotation 30 degrees .  Otherwise strength grossly normal in 4 extremities.        Assessment & Plan:  104. 37 year old female, status post total mastectomy on the right in September 2011, and subsequentially chemotherapy and radiation therapy, after which she developed right shoulder and right chest pain with contractures of the pectoralis major and minor muscle.   2. Latissimus dorsi flap breast reconstruction of the right breast done on 03/12/13, by Dr. Kelly Splinter, she is in pain, after surgery she was prescribed Oxycodone 10 mg q 6 hrs prn pain. Prescribed Oxycodone 10mg  q 8 hrs, # 90, consider tapering down next visit, if possible. Prescribed PT, ordered that they should be careful, pain should be a guide, also NO lifting or strenuous exercises, but mild massage would be beneficial.  The patient should continue with her current medication.Patient can not switch her Celexa to Cymbalta, because she is on Tamoxifen. She should continue with the exercises she learned at physical therapy .I also advised her to get back into aquatic exercises, which were beneficial in the past.  Prescribed Lyrica in the past for her radiating pain, which she did not tolerate. Prescribed Flexeril in the past for her muscle spasms, which did not help much, patient d/ced Flexeril.   She should continue using the TENS unit, which gives her relief .   Patient should follow up in 1 month.

## 2013-04-30 ENCOUNTER — Other Ambulatory Visit: Payer: Self-pay | Admitting: *Deleted

## 2013-04-30 DIAGNOSIS — C50919 Malignant neoplasm of unspecified site of unspecified female breast: Secondary | ICD-10-CM

## 2013-04-30 MED ORDER — ALPRAZOLAM ER 1 MG PO TB24: 1.0000 mg | ORAL_TABLET | Freq: Every day | ORAL | Status: DC

## 2013-05-04 ENCOUNTER — Other Ambulatory Visit: Payer: Self-pay | Admitting: *Deleted

## 2013-05-04 DIAGNOSIS — R51 Headache: Secondary | ICD-10-CM

## 2013-05-04 DIAGNOSIS — C50919 Malignant neoplasm of unspecified site of unspecified female breast: Secondary | ICD-10-CM

## 2013-05-04 MED ORDER — TRIAMTERENE-HCTZ 37.5-25 MG PO TABS: 1.0000 | ORAL_TABLET | Freq: Every day | ORAL | Status: DC

## 2013-05-09 ENCOUNTER — Ambulatory Visit: Payer: Medicare Other | Attending: Physical Medicine and Rehabilitation | Admitting: Physical Therapy

## 2013-05-09 DIAGNOSIS — M25519 Pain in unspecified shoulder: Secondary | ICD-10-CM | POA: Insufficient documentation

## 2013-05-09 DIAGNOSIS — M24519 Contracture, unspecified shoulder: Secondary | ICD-10-CM | POA: Insufficient documentation

## 2013-05-09 DIAGNOSIS — IMO0001 Reserved for inherently not codable concepts without codable children: Secondary | ICD-10-CM | POA: Insufficient documentation

## 2013-05-11 ENCOUNTER — Ambulatory Visit: Payer: Medicare Other | Admitting: Physical Therapy

## 2013-05-14 ENCOUNTER — Encounter: Payer: Self-pay | Admitting: *Deleted

## 2013-05-14 NOTE — Progress Notes (Signed)
RECEIVED A FAX FROM [facility] CONCERNING A CERTIFICATE OF MEDICAL NECESSITY FOR PROSTHETIC ORTHOTIC FOR CHRONIC LYMPHEDEMA. THIS REQUEST WAS GIVEN TO DR.MAGRINAT'S NURSE, VAL DODD,RN.

## 2013-05-15 ENCOUNTER — Ambulatory Visit: Payer: Medicare Other | Admitting: Physical Therapy

## 2013-05-17 ENCOUNTER — Encounter: Payer: Self-pay | Admitting: Family Medicine

## 2013-05-18 ENCOUNTER — Ambulatory Visit: Payer: Medicare Other | Admitting: Physical Therapy

## 2013-05-21 ENCOUNTER — Other Ambulatory Visit (HOSPITAL_BASED_OUTPATIENT_CLINIC_OR_DEPARTMENT_OTHER): Payer: Medicare Other

## 2013-05-21 ENCOUNTER — Other Ambulatory Visit: Payer: Self-pay | Admitting: *Deleted

## 2013-05-21 ENCOUNTER — Other Ambulatory Visit: Payer: Self-pay | Admitting: Physician Assistant

## 2013-05-21 ENCOUNTER — Telehealth: Payer: Self-pay | Admitting: *Deleted

## 2013-05-21 DIAGNOSIS — C50919 Malignant neoplasm of unspecified site of unspecified female breast: Secondary | ICD-10-CM

## 2013-05-21 DIAGNOSIS — E876 Hypokalemia: Secondary | ICD-10-CM

## 2013-05-21 DIAGNOSIS — C50419 Malignant neoplasm of upper-outer quadrant of unspecified female breast: Secondary | ICD-10-CM

## 2013-05-21 DIAGNOSIS — C50911 Malignant neoplasm of unspecified site of right female breast: Secondary | ICD-10-CM

## 2013-05-21 LAB — BASIC METABOLIC PANEL (CC13)
BUN: 8 mg/dL (ref 7.0–26.0)
CO2: 23 mEq/L (ref 22–29)
Chloride: 105 mEq/L (ref 98–109)
Creatinine: 0.7 mg/dL (ref 0.6–1.1)
Glucose: 125 mg/dl (ref 70–140)

## 2013-05-21 MED ORDER — POTASSIUM CHLORIDE ER 10 MEQ PO CPCR: 10.0000 meq | ORAL_CAPSULE | Freq: Two times a day (BID) | ORAL | Status: DC

## 2013-05-21 NOTE — Telephone Encounter (Signed)
Per PA-C request. Called pt with the results of potassium level(3.3) I asked patient if she was taking potassium regularly. The patient said she was unable to take them because the pills are big and she can't swallow them. Communicated this with Amy Allyson Sabal PA-C. Micro K+ was Rx for patient and sent to pharmacy. Called patient back to let her know to pick up med. Instructed patient to take one 10 mEq K+ twice a day. Informed pt labs will be repeated in 6 weeks and scheduling will call her. Pt verbalized understanding. No further concerns.

## 2013-05-22 ENCOUNTER — Ambulatory Visit: Payer: Medicare Other

## 2013-05-22 ENCOUNTER — Telehealth: Payer: Self-pay | Admitting: Oncology

## 2013-05-22 ENCOUNTER — Other Ambulatory Visit: Payer: Self-pay | Admitting: *Deleted

## 2013-05-22 MED ORDER — OXYCODONE HCL 10 MG PO TABS: 10.0000 mg | ORAL_TABLET | Freq: Three times a day (TID) | ORAL | Status: DC | PRN

## 2013-05-22 NOTE — Telephone Encounter (Signed)
RX printed early for controlled medication for the visit with RN on 05/28/13 (to be signed by MD) 

## 2013-05-22 NOTE — Telephone Encounter (Signed)
, °

## 2013-05-23 ENCOUNTER — Ambulatory Visit: Payer: Medicare Other

## 2013-05-24 ENCOUNTER — Telehealth: Payer: Self-pay

## 2013-05-24 NOTE — Telephone Encounter (Signed)
Patient called and said her pain has increased and also that she will be out of her medication before her next appointment.

## 2013-05-24 NOTE — Telephone Encounter (Signed)
Patient has scheduled an earlier appointment to discuss her pain.

## 2013-05-25 ENCOUNTER — Encounter: Payer: Self-pay | Admitting: Physical Medicine & Rehabilitation

## 2013-05-25 ENCOUNTER — Encounter: Payer: Medicare Other | Attending: Physical Medicine and Rehabilitation

## 2013-05-25 ENCOUNTER — Telehealth: Payer: Self-pay | Admitting: Family Medicine

## 2013-05-25 ENCOUNTER — Ambulatory Visit (HOSPITAL_BASED_OUTPATIENT_CLINIC_OR_DEPARTMENT_OTHER): Payer: Medicare Other | Admitting: Physical Medicine & Rehabilitation

## 2013-05-25 ENCOUNTER — Encounter: Payer: Medicare Other | Admitting: Physical Therapy

## 2013-05-25 VITALS — BP 148/76 | HR 105 | Resp 14 | Ht 64.0 in | Wt 266.0 lb

## 2013-05-25 DIAGNOSIS — C50919 Malignant neoplasm of unspecified site of unspecified female breast: Secondary | ICD-10-CM | POA: Insufficient documentation

## 2013-05-25 DIAGNOSIS — Z9889 Other specified postprocedural states: Secondary | ICD-10-CM | POA: Insufficient documentation

## 2013-05-25 DIAGNOSIS — G893 Neoplasm related pain (acute) (chronic): Secondary | ICD-10-CM | POA: Insufficient documentation

## 2013-05-25 DIAGNOSIS — IMO0001 Reserved for inherently not codable concepts without codable children: Secondary | ICD-10-CM

## 2013-05-25 DIAGNOSIS — M7918 Myalgia, other site: Secondary | ICD-10-CM

## 2013-05-25 DIAGNOSIS — M549 Dorsalgia, unspecified: Secondary | ICD-10-CM | POA: Insufficient documentation

## 2013-05-25 MED ORDER — OXYCODONE HCL 10 MG PO TABS: 10.0000 mg | ORAL_TABLET | Freq: Three times a day (TID) | ORAL | Status: DC | PRN

## 2013-05-25 MED ORDER — TIZANIDINE HCL 4 MG PO TABS: 4.0000 mg | ORAL_TABLET | Freq: Three times a day (TID) | ORAL | Status: DC

## 2013-05-25 NOTE — Telephone Encounter (Signed)
[facility] red team,   Please call patient and let her know the following: 1. After speaking with her oncology provider, she needs a pap smear. We did not do this in her physical several months ago. She can schedule an appointment with me to have this done. 2. I received a letter from her ophthalmologist about her eye pain. They would like her to follow up with me in the office regarding this issue. Please ask her to schedule a separate appointment to address this concern.  Thank you! Latrelle Dodrill, MD

## 2013-05-25 NOTE — Telephone Encounter (Signed)
Called pt. 'the mailbox is full, unable to leave message' .Arlyss Repress

## 2013-05-25 NOTE — Progress Notes (Signed)
Subjective:    Patient ID: Karen Hopkins, female    DOB: [birth year], 37 y.o.   MRN: [id number]  HPI Breast reconstruction with latissimus dorsi flap 03/12/2013. Has had followup with plastic surgery. Filling tissue expander every 2 weeks, 70 ML's placed today. May need up to additional 200 mL. Plans about another 3 more fills part February when left breast will be reduced.  Undergoing physical therapy' once a week started one week ago and is scheduled through January 9. Pain Inventory Average Pain 9 Pain Right Now 9 My pain is sharp and stabbing  In the last 24 hours, has pain interfered with the following? General activity 4 Relation with others 4 Enjoyment of life 4 What TIME of day is your pain at its worst? all day Sleep (in general) Poor  Pain is worse with: walking, sitting, inactivity and standing Pain improves with: medication Relief from Meds: 7  Mobility walk without assistance how many minutes can you walk? 15 ability to climb steps?  yes do you drive?  yes transfers alone  Function Do you have any goals in this area?  no  Neuro/Psych weakness numbness spasms dizziness confusion depression anxiety  Prior Studies Any changes since last visit?  no  Physicians involved in your care Any changes since last visit?  no   Family History  Problem Relation Age of Onset  . Hypertension Mother   . Diabetes type II Father   . Prostate cancer Father    History   Social History  . Marital Status: Single    Spouse Name: N/A    Number of Children: N/A  . Years of Education: N/A   Social History Main Topics  . Smoking status: Former Smoker -- 0.25 packs/day for 5 years    Types: Cigarettes    Quit date: 04/26/2010  . Smokeless tobacco: Never Used  . Alcohol Use: No  . Drug Use: No  . Sexual Activity: None   Other Topics Concern  . None   Social History Narrative  . None   Past Surgical History  Procedure Laterality Date  . Mastectomy   right sided complete  . Hernia repair  2006  . Nasal septum surgery    . Latissimus flap to breast Right 03/12/2013    Procedure: RIGHT BREAST LATISSIMUS FLAP WITH EXPANDER PLACEMENT;  Surgeon: Wayland Denis, DO;  Location: [facility] OR;  Service: Plastics;  Laterality: Right;   Past Medical History  Diagnosis Date  . Breast cancer stage 3    radiation/chemo/ right mastectomy  . Seasonal allergies   . Hypertension   . Lymphedema of arm right   . Depression   . Anxiety    BP 148/76  Pulse 105  Resp 14  Ht 5\' 4"  (1.626 m)  Wt 266 lb (120.657 kg)  BMI 45.64 kg/m2  SpO2 97%      Review of Systems  Musculoskeletal: Positive for back pain.  Neurological: Positive for dizziness, weakness and numbness.       Spasms  Psychiatric/Behavioral: Positive for confusion and dysphoric mood. The patient is nervous/anxious.   All other systems reviewed and are negative.       Objective:   Physical Exam Back incision well healed. Breast  incision healing Range of motion reduced with internal and external rotation of the right shoulder. No swelling in the right hand. Lymphedema sleeve right deltoid to right wrist  No hypersensitivity to touch over the hand area. No erythema or swelling over the hand Mood  and affect are appropriate, smiling      Assessment & Plan:  1. Chronic neoplasm related pain with more acute pain related to reconstructive surgery right breast and para scapular area. Has limitation with range of motion and is undergoing physical therapy. We'll continue current oxycodone While patient is going through PT will also add Zanaflex Once patient is done with PT as well as tissue expansion will reduce dose. Then we'll need to increase dose again postoperatively in February

## 2013-05-25 NOTE — Patient Instructions (Signed)
Tizanidine tablets or capsules What is this medicine? TIZANIDINE (tye ZAN i deen) helps to relieve muscle spasms. It may be used to help in the treatment of multiple sclerosis and spinal cord injury. This medicine may be used for other purposes; ask your health care provider or pharmacist if you have questions. COMMON BRAND NAME(S): Zanaflex What should I tell my health care provider before I take this medicine? They need to know if you have any of these conditions: -kidney disease -liver disease -low blood pressure -mental disorder -an unusual or allergic reaction to tizanidine, other medicines, lactose (tablets only), foods, dyes, or preservatives -pregnant or trying to get pregnant -breast-feeding How should I use this medicine? Take this medicine by mouth with a full glass of water. Take this medicine on an empty stomach, at least 30 minutes before or 2 hours after food. Do not take with food unless you talk with your doctor. Follow the directions on the prescription label. Take your medicine at regular intervals. Do not take your medicine more often than directed. Do not stop taking except on your doctor's advice. Suddenly stopping the medicine can be very dangerous. Talk to your pediatrician regarding the use of this medicine in children. Patients over 65 years old may have a stronger reaction and need a smaller dose. Overdosage: If you think you have taken too much of this medicine contact a poison control center or emergency room at once. NOTE: This medicine is only for you. Do not share this medicine with others. What if I miss a dose? If you miss a dose, take it as soon as you can. If it is almost time for your next dose, take only that dose. Do not take double or extra doses. What may interact with this medicine? Do not take this medicine with any of the following medications: -ciprofloxacin -clonidine -fluvoxamine -guanabenz -guanfacine -methyldopa This medicine may also  interact with the following medications: -acyclovir -alcohol -antihistamines -baclofen -barbiturates like phenobarbital -benzodiazepines -cimetidine -famotidine -female hormones, like estrogens or progestins and birth control pills -medicines for high blood pressure -medicines for irregular heartbeat -medicines for pain like codeine, morphine, and hydrocodone -medicines for sleep -rofecoxib -some antibiotics like levofloxacin, ofloxacin -ticlopidine -zileuton This list may not describe all possible interactions. Give your health care provider a list of all the medicines, herbs, non-prescription drugs, or dietary supplements you use. Also tell them if you smoke, drink alcohol, or use illegal drugs. Some items may interact with your medicine. What should I watch for while using this medicine? You may get drowsy or dizzy. Do not drive, use machinery, or do anything that needs mental alertness until you know how this medicine affects you. Do not stand or sit up quickly, especially if you are an older patient. This reduces the risk of dizzy or fainting spells. Alcohol may interfere with the effect of this medicine. Avoid alcoholic drinks. Your mouth may get dry. Chewing sugarless gum or sucking hard candy, and drinking plenty of water may help. Contact your doctor if the problem does not go away or is severe. What side effects may I notice from receiving this medicine? Side effects that you should report to your doctor or health care professional as soon as possible: -allergic reactions like skin rash, itching or hives, swelling of the face, lips, or tongue -blurred vision -fainting spells -hallucinations -nausea or vomiting -nervousness -redness, blistering, peeling or loosening of the skin, including inside the mouth -slow or irregular heartbeat, palpitations, or chest pain -yellowing of   the skin or eyes Side effects that usually do not require medical attention (report to your doctor  or health care professional if they continue or are bothersome): -dizziness -drowsiness -dry mouth -tiredness or weakness This list may not describe all possible side effects. Call your doctor for medical advice about side effects. You may report side effects to FDA at 1-800-FDA-1088. Where should I keep my medicine? Keep out of the reach of children. Store at room temperature between 15 and 30 degrees C (59 and 86 degrees F). Throw away any unused medicine after the expiration date. NOTE: This sheet is a summary. It may not cover all possible information. If you have questions about this medicine, talk to your doctor, pharmacist, or health care provider.  2014, Elsevier/Gold Standard. (2008-02-08 12:38:02)  

## 2013-05-28 ENCOUNTER — Ambulatory Visit: Payer: Medicare Other

## 2013-05-28 ENCOUNTER — Emergency Department (INDEPENDENT_AMBULATORY_CARE_PROVIDER_SITE_OTHER)
Admission: EM | Admit: 2013-05-28 | Discharge: 2013-05-28 | Disposition: A | Discharge status: Home / Self Care | Payer: Medicare Other | Source: Home / Self Care | Attending: Family Medicine | Admitting: Family Medicine

## 2013-05-28 ENCOUNTER — Encounter (HOSPITAL_COMMUNITY): Payer: Self-pay | Admitting: Emergency Medicine

## 2013-05-28 DIAGNOSIS — J111 Influenza due to unidentified influenza virus with other respiratory manifestations: Secondary | ICD-10-CM

## 2013-05-28 MED ORDER — ONDANSETRON 4 MG PO TBDP
ORAL_TABLET | ORAL | Status: AC
  Filled 2013-05-28: qty 1

## 2013-05-28 MED ORDER — ONDANSETRON HCL 4 MG PO TABS: 4.0000 mg | ORAL_TABLET | Freq: Four times a day (QID) | ORAL | Status: DC

## 2013-05-28 MED ORDER — ONDANSETRON 4 MG PO TBDP
8.0000 mg | ORAL_TABLET | Freq: Once | ORAL | Status: AC
  Administered 2013-05-28: 8 mg via ORAL

## 2013-05-28 MED ORDER — ACETAMINOPHEN 325 MG PO TABS
ORAL_TABLET | ORAL | Status: AC
  Filled 2013-05-28: qty 3

## 2013-05-28 NOTE — ED Notes (Signed)
C/o  Body aches.  Fever.  Cough.  Nausea.  Symptoms present since yesterday.

## 2013-05-28 NOTE — ED Notes (Signed)
Patient declined tylenol, pills too " big"

## 2013-05-28 NOTE — ED Notes (Signed)
Nausea has resolved

## 2013-05-28 NOTE — ED Provider Notes (Signed)
CSN: [id number]     Arrival date & time 05/28/13  1557 History   First MD Initiated Contact with Patient 05/28/13 1738     Chief Complaint  Patient presents with  . Influenza   (Consider location/radiation/quality/duration/timing/severity/associated sxs/prior Treatment) Patient is a 37 y.o. female presenting with flu symptoms. The history is provided by the patient.  Influenza Presenting symptoms: cough, fever, myalgias, nausea and vomiting   Presenting symptoms: no diarrhea   Severity:  Moderate Onset quality:  Sudden Duration:  1 day   Past Medical History  Diagnosis Date  . Breast cancer stage 3    radiation/chemo/ right mastectomy  . Seasonal allergies   . Hypertension   . Lymphedema of arm right   . Depression   . Anxiety    Past Surgical History  Procedure Laterality Date  . Mastectomy  right sided complete  . Hernia repair  2006  . Nasal septum surgery    . Latissimus flap to breast Right 03/12/2013    Procedure: RIGHT BREAST LATISSIMUS FLAP WITH EXPANDER PLACEMENT;  Surgeon: Wayland Denis, DO;  Location: [facility] OR;  Service: Plastics;  Laterality: Right;   Family History  Problem Relation Age of Onset  . Hypertension Mother   . Diabetes type II Father   . Prostate cancer Father    History  Substance Use Topics  . Smoking status: Former Smoker -- 0.25 packs/day for 5 years    Types: Cigarettes    Quit date: 04/26/2010  . Smokeless tobacco: Never Used  . Alcohol Use: No   OB History   Grav Para Term Preterm Abortions TAB SAB Ect Mult Living                 Review of Systems  Constitutional: Positive for fever.  HENT: Negative.   Respiratory: Positive for cough.   Gastrointestinal: Positive for nausea and vomiting. Negative for diarrhea.  Musculoskeletal: Positive for myalgias.    Allergies  Lyrica; Meloxicam; Penicillins; and Robaxin  Home Medications   Current Outpatient Rx  Name  Route  Sig  Dispense  Refill  . ALPRAZolam (XANAX XR) 1 MG 24 hr  tablet   Oral   Take 1 tablet (1 mg total) by mouth daily.   30 tablet   3     SPOKE TO PHARMACIST   . citalopram (CELEXA) 20 MG tablet   Oral   Take 1 tablet (20 mg total) by mouth daily.   30 tablet   5   . tamoxifen (NOLVADEX) 20 MG tablet   Oral   Take 1 tablet (20 mg total) by mouth daily.   90 tablet   3     Faxed to [facility]   [phone]  ;  Phone  ...   . albuterol (PROVENTIL HFA;VENTOLIN HFA) 108 (90 BASE) MCG/ACT inhaler   Inhalation   Inhale 2 puffs into the lungs every 6 (six) hours as needed for wheezing.   1 Inhaler   2   . diazepam (VALIUM) 2 MG tablet               . diclofenac sodium (VOLTAREN) 1 % GEL   Topical   Apply 2 g topically 4 (four) times daily.   2 Tube   2   . MELATONIN PO   Oral   Take 10 mg by mouth at bedtime.          . Multiple Vitamin (MULTIVITAMIN WITH MINERALS) TABS tablet   Oral   Take  1 tablet by mouth daily.         . naproxen (NAPROSYN) 500 MG tablet   Oral   Take 500 mg by mouth 3 (three) times daily as needed. headache         . ondansetron (ZOFRAN) 4 MG tablet   Oral   Take 1 tablet (4 mg total) by mouth every 6 (six) hours. Prn n/v   8 tablet   0   . Oxycodone HCl 10 MG TABS   Oral   Take 1 tablet (10 mg total) by mouth every 8 (eight) hours as needed.   90 tablet   0     Must last 30 days   . potassium chloride (MICRO-K) 10 MEQ CR capsule   Oral   Take 1 capsule (10 mEq total) by mouth 2 (two) times daily.   120 capsule   1     Dispense as written.   Marland Kitchen tiZANidine (ZANAFLEX) 4 MG tablet   Oral   Take 1 tablet (4 mg total) by mouth 3 (three) times daily.   30 tablet   0   . traMADol (ULTRAM) 50 MG tablet   Oral   Take 1 tablet (50 mg total) by mouth every 6 (six) hours as needed.   30 tablet   0   . triamterene-hydrochlorothiazide (MAXZIDE-25) 37.5-25 MG per tablet   Oral   Take 1 tablet by mouth daily.   30 tablet   3    BP 143/85  Pulse 124  Temp(Src) 102.2 F  (39 C) (Oral)  Resp 22  SpO2 96% Physical Exam  Nursing note and vitals reviewed. Constitutional: She is oriented to [place], place, and time. She appears well-developed and well-nourished.  HENT:  Head: Normocephalic.  Right Ear: External ear normal.  Left Ear: External ear normal.  Mouth/Throat: Oropharynx is clear and moist.  Neck: Normal range of motion. Neck supple.  Pulmonary/Chest: Breath sounds normal.  Abdominal: Soft. There is tenderness.  Lymphadenopathy:    She has no cervical adenopathy.  Neurological: She is alert and oriented to [place], place, and time.  Skin: Skin is warm and dry.    ED Course  Procedures (including critical care time) Labs Review Labs Reviewed - No data to display Imaging Review No results found.  EKG Interpretation    Date/Time:    Ventricular Rate:    PR Interval:    QRS Duration:   QT Interval:    QTC Calculation:   R Axis:     Text Interpretation:              MDM      Linna Hoff, MD 05/29/13 2025

## 2013-05-29 ENCOUNTER — Ambulatory Visit: Payer: Medicare Other

## 2013-05-29 ENCOUNTER — Encounter: Payer: Self-pay | Admitting: Family Medicine

## 2013-05-29 ENCOUNTER — Telehealth: Payer: Self-pay | Admitting: Family Medicine

## 2013-05-29 DIAGNOSIS — Z0000 Encounter for general adult medical examination without abnormal findings: Secondary | ICD-10-CM | POA: Insufficient documentation

## 2013-05-29 NOTE — Telephone Encounter (Signed)
Spoke with patient and was able to give her the messages from Dr. Pollie Meyer.  See below.  Pt verbalized undeerstanding and will call and schedule two OV.  Karen Hopkins, Karen Hopkins, CMA

## 2013-05-29 NOTE — Telephone Encounter (Signed)
Can we try to call this pt again? Thanks! Latrelle Dodrill, MD

## 2013-05-29 NOTE — Telephone Encounter (Signed)
Pt calling to let me know that she has flu like symptoms and is in a lot of pain and wondering what can be done about it. i stated that she can take tylenol or ibuprofen for the fever and aches and to stay hydrated. There is only supportive mgt. Pt instructed to come to hospital if difficulty breathing on dehydration. Pt acknowledged understanding.   Si Raider Clinton Sawyer, MD, MBA 05/29/2013, 2:39 AM Family Medicine Resident, PGY-3

## 2013-05-30 ENCOUNTER — Other Ambulatory Visit: Payer: Self-pay | Admitting: *Deleted

## 2013-05-30 MED ORDER — AZITHROMYCIN 250 MG PO TABS: ORAL_TABLET | ORAL | Status: DC

## 2013-05-30 NOTE — Telephone Encounter (Signed)
Karen Hopkins called to this RN to state she has developed upper respiratory congestion- noted sinus pain and ear pressure. She is running low grade temp.  Per discussion Z pak and request for generic sudafed ( 12hr ) called to pharmacy.

## 2013-06-05 ENCOUNTER — Ambulatory Visit: Payer: Medicare Other

## 2013-06-08 ENCOUNTER — Ambulatory Visit: Payer: Medicare Other | Attending: Physical Medicine and Rehabilitation | Admitting: Physical Therapy

## 2013-06-08 DIAGNOSIS — M24519 Contracture, unspecified shoulder: Secondary | ICD-10-CM | POA: Insufficient documentation

## 2013-06-08 DIAGNOSIS — IMO0001 Reserved for inherently not codable concepts without codable children: Secondary | ICD-10-CM | POA: Insufficient documentation

## 2013-06-08 DIAGNOSIS — M25519 Pain in unspecified shoulder: Secondary | ICD-10-CM | POA: Insufficient documentation

## 2013-06-12 ENCOUNTER — Ambulatory Visit: Payer: Medicare Other

## 2013-06-12 ENCOUNTER — Ambulatory Visit (INDEPENDENT_AMBULATORY_CARE_PROVIDER_SITE_OTHER): Payer: Medicare Other | Admitting: *Deleted

## 2013-06-12 DIAGNOSIS — Z23 Encounter for immunization: Secondary | ICD-10-CM

## 2013-06-15 ENCOUNTER — Other Ambulatory Visit: Payer: Self-pay

## 2013-06-15 ENCOUNTER — Ambulatory Visit: Payer: Medicare Other | Admitting: Physical Therapy

## 2013-06-15 MED ORDER — TIZANIDINE HCL 4 MG PO TABS: 4.0000 mg | ORAL_TABLET | Freq: Three times a day (TID) | ORAL | Status: DC

## 2013-06-16 ENCOUNTER — Emergency Department (INDEPENDENT_AMBULATORY_CARE_PROVIDER_SITE_OTHER)
Admission: EM | Admit: 2013-06-16 | Discharge: 2013-06-16 | Disposition: A | Discharge status: Home / Self Care | Payer: Medicare Other | Source: Home / Self Care | Attending: Emergency Medicine | Admitting: Emergency Medicine

## 2013-06-16 ENCOUNTER — Encounter (HOSPITAL_COMMUNITY): Payer: Self-pay | Admitting: Emergency Medicine

## 2013-06-16 DIAGNOSIS — H109 Unspecified conjunctivitis: Secondary | ICD-10-CM

## 2013-06-16 MED ORDER — POLYMYXIN B-TRIMETHOPRIM 10000-0.1 UNIT/ML-% OP SOLN: 1.0000 [drp] | OPHTHALMIC | Status: DC

## 2013-06-16 NOTE — Discharge Instructions (Signed)

## 2013-06-16 NOTE — ED Provider Notes (Signed)
Chief Complaint:   Chief Complaint  Patient presents with  . Conjunctivitis    History of Present Illness:   Karen Hopkins is a 38 year old female who has had a two-day history of redness of the right eye, yellow discharge, sticky sensation, pain, burning, and irritation. The patient states her vision is normal. She does not use contact lenses. She denies any injury or trauma to the eye. She has had no suspicious exposures. She denies any URI symptoms such as fever, chills, headache, nasal congestion, rhinorrhea, sore throat, adenopathy, or cough.  Review of Systems:  Other than noted above, the patient denies any of the following symptoms: Systemic:  No fever, chills, sweats, fatigue, or weight loss. Eye:  No redness, eye pain, photophobia, discharge, blurred vision, or diplopia. ENT:  No nasal congestion, rhinorrhea, or sore throat. Lymphatic:  No adenopathy. Skin:  No rash or pruritis.  [place]:  Past medical history, family history, social history, meds, and allergies were reviewed.  She is allergic to penicillin. She has had breast cancer, chronic pain, high blood pressure. She takes tamoxifen, Maxzide, Celexa, Xanax, oxycodone, and tizanidine.  Physical Exam:   Vital signs:  BP 127/85  Pulse 98  Temp(Src) 98.3 F (36.8 C) (Oral)  Resp 18  SpO2 100%  Visual Acuity:  Right Eye Distance: 20/25 Left Eye Distance: 20/25 Bilateral Distance: 20/20  General:  Alert and in no distress. Eye:  Lids and periorbital structures are normal. Conjunctiva of the right eye was injected and. There was some crusted drainage on the eyelids. The cornea was intact to fluorescein staining. Anterior chamber was normal, PERRLA, full EOMs, fundi were benign. ENT:  TMs and canals clear.  Nasal mucosa normal.  No intra-oral lesions, mucous membranes moist, pharynx clear. Neck:  No adenopathy tenderness or mass. Skin:  Clear, warm and dry.  Assessment:  The encounter diagnosis was  Conjunctivitis.  Plan:   1.  Meds:  The following meds were prescribed:   New Prescriptions   TRIMETHOPRIM-POLYMYXIN B (POLYTRIM) OPHTHALMIC SOLUTION    Place 1 drop into the right eye every 4 (four) hours.    2.  Patient Education/Counseling:  The patient was given appropriate handouts, self care instructions, and instructed in symptomatic relief.  Patient told not to rub the eyes and to use sunglasses he should take infectious precautions.  3.  Follow up:  The patient was told to follow up if no better in 3 to 4 days, if becoming worse in any way, and given some red flag symptoms such as any change in vision which would prompt immediate return.  Follow up here if necessary.      Harden Mo, MD 06/16/13 1010

## 2013-06-16 NOTE — ED Notes (Signed)
Pink eye, right eye, watery, painful, onset 2 days ago

## 2013-06-18 ENCOUNTER — Ambulatory Visit: Payer: Medicare Other | Admitting: Physical Therapy

## 2013-06-20 ENCOUNTER — Ambulatory Visit: Payer: Medicare Other | Admitting: Physical Therapy

## 2013-06-22 ENCOUNTER — Telehealth: Payer: Self-pay | Admitting: *Deleted

## 2013-06-22 NOTE — Telephone Encounter (Signed)
Calling about papers that were faxed over for a compression device and certificate of medical necessity to be filled out.  Did you receive them?  Please call and let Becky know.

## 2013-06-25 ENCOUNTER — Ambulatory Visit: Payer: Medicare Other | Admitting: Physical Therapy

## 2013-06-28 ENCOUNTER — Ambulatory Visit: Payer: Medicare Other | Admitting: Physical Therapy

## 2013-06-29 ENCOUNTER — Telehealth: Payer: Self-pay

## 2013-06-29 MED ORDER — OXYCODONE HCL 10 MG PO TABS: 10.0000 mg | ORAL_TABLET | Freq: Three times a day (TID) | ORAL | Status: DC | PRN

## 2013-06-29 NOTE — Telephone Encounter (Signed)
Patient called requesting a refill on her pain medication.  She will be out before her next appointment.  Oxycodone rx printed to be signed.  Will contact patient when ready.

## 2013-06-29 NOTE — Telephone Encounter (Signed)
Left message advising patient oxycodone rx is ready for pick up.

## 2013-07-02 ENCOUNTER — Ambulatory Visit (HOSPITAL_BASED_OUTPATIENT_CLINIC_OR_DEPARTMENT_OTHER): Payer: Medicare Other | Admitting: Physical Medicine & Rehabilitation

## 2013-07-02 ENCOUNTER — Encounter: Payer: Self-pay | Admitting: Physical Medicine & Rehabilitation

## 2013-07-02 ENCOUNTER — Ambulatory Visit (HOSPITAL_BASED_OUTPATIENT_CLINIC_OR_DEPARTMENT_OTHER): Payer: Medicare Other

## 2013-07-02 ENCOUNTER — Ambulatory Visit: Payer: Medicare Other

## 2013-07-02 ENCOUNTER — Encounter: Payer: Medicare Other | Attending: Physical Medicine and Rehabilitation

## 2013-07-02 VITALS — BP 148/81 | HR 108 | Resp 14 | Ht 64.0 in | Wt 260.0 lb

## 2013-07-02 DIAGNOSIS — IMO0001 Reserved for inherently not codable concepts without codable children: Secondary | ICD-10-CM

## 2013-07-02 DIAGNOSIS — Z5181 Encounter for therapeutic drug level monitoring: Secondary | ICD-10-CM

## 2013-07-02 DIAGNOSIS — M19019 Primary osteoarthritis, unspecified shoulder: Secondary | ICD-10-CM | POA: Insufficient documentation

## 2013-07-02 DIAGNOSIS — C50919 Malignant neoplasm of unspecified site of unspecified female breast: Secondary | ICD-10-CM | POA: Insufficient documentation

## 2013-07-02 DIAGNOSIS — M549 Dorsalgia, unspecified: Secondary | ICD-10-CM | POA: Insufficient documentation

## 2013-07-02 DIAGNOSIS — E876 Hypokalemia: Secondary | ICD-10-CM

## 2013-07-02 DIAGNOSIS — G893 Neoplasm related pain (acute) (chronic): Secondary | ICD-10-CM

## 2013-07-02 DIAGNOSIS — Z79899 Other long term (current) drug therapy: Secondary | ICD-10-CM

## 2013-07-02 DIAGNOSIS — C50419 Malignant neoplasm of upper-outer quadrant of unspecified female breast: Secondary | ICD-10-CM

## 2013-07-02 DIAGNOSIS — M259 Joint disorder, unspecified: Secondary | ICD-10-CM

## 2013-07-02 DIAGNOSIS — Z9889 Other specified postprocedural states: Secondary | ICD-10-CM | POA: Insufficient documentation

## 2013-07-02 LAB — BASIC METABOLIC PANEL (CC13)
Anion Gap: 10 mEq/L (ref 3–11)
BUN: 8.1 mg/dL (ref 7.0–26.0)
CHLORIDE: 105 meq/L (ref 98–109)
CO2: 26 mEq/L (ref 22–29)
Calcium: 9.7 mg/dL (ref 8.4–10.4)
Creatinine: 0.7 mg/dL (ref 0.6–1.1)
Glucose: 96 mg/dl (ref 70–140)
POTASSIUM: 3.8 meq/L (ref 3.5–5.1)
Sodium: 140 mEq/L (ref 136–145)

## 2013-07-02 NOTE — Progress Notes (Signed)
Ltd. ultrasound right shoulder  Indication clicking at the right shoulder with movement  12 Hz linear transducer Patient in seated position  Long axis the use demonstrate mild irregularity of joint surfaces. Capsule is intact. No evidence of joint effusion  Impression: 1. Right a.c. joint with mild arthritic changes

## 2013-07-03 ENCOUNTER — Other Ambulatory Visit: Payer: Medicare Other

## 2013-07-03 ENCOUNTER — Telehealth: Payer: Self-pay | Admitting: *Deleted

## 2013-07-03 NOTE — Telephone Encounter (Signed)
Per PA-C request. Called pt to inform her of potassium levels. Communicated to pt to stay on current dose and to return in June as scheduled. Message to be forwarded to [employer], PA-C.

## 2013-07-04 ENCOUNTER — Ambulatory Visit: Payer: Medicare Other

## 2013-07-09 ENCOUNTER — Ambulatory Visit: Payer: Medicare Other | Attending: Physical Medicine and Rehabilitation

## 2013-07-09 DIAGNOSIS — M24519 Contracture, unspecified shoulder: Secondary | ICD-10-CM | POA: Insufficient documentation

## 2013-07-09 DIAGNOSIS — M25519 Pain in unspecified shoulder: Secondary | ICD-10-CM | POA: Insufficient documentation

## 2013-07-09 DIAGNOSIS — IMO0001 Reserved for inherently not codable concepts without codable children: Secondary | ICD-10-CM | POA: Insufficient documentation

## 2013-07-11 ENCOUNTER — Ambulatory Visit: Payer: Medicare Other

## 2013-07-16 ENCOUNTER — Ambulatory Visit: Payer: Medicare Other

## 2013-07-18 ENCOUNTER — Ambulatory Visit: Payer: Medicare Other

## 2013-07-23 ENCOUNTER — Ambulatory Visit: Payer: Medicare Other | Admitting: Physical Therapy

## 2013-07-25 ENCOUNTER — Ambulatory Visit: Payer: Medicare Other

## 2013-07-27 ENCOUNTER — Other Ambulatory Visit: Payer: Self-pay | Admitting: *Deleted

## 2013-07-27 MED ORDER — OXYCODONE HCL 10 MG PO TABS: 10.0000 mg | ORAL_TABLET | Freq: Three times a day (TID) | ORAL | Status: DC | PRN

## 2013-07-27 NOTE — Telephone Encounter (Signed)
RX printed early for controlled medication for the visit with RN on 07/30/13 (to be signed by MD) 

## 2013-07-30 ENCOUNTER — Ambulatory Visit: Payer: Medicare Other

## 2013-07-30 ENCOUNTER — Encounter: Payer: Self-pay | Admitting: *Deleted

## 2013-07-30 ENCOUNTER — Encounter: Payer: Medicare Other | Attending: Physical Medicine & Rehabilitation | Admitting: *Deleted

## 2013-07-30 ENCOUNTER — Other Ambulatory Visit: Payer: Self-pay | Admitting: Oncology

## 2013-07-30 VITALS — BP 148/72 | HR 94 | Resp 16 | Wt 260.2 lb

## 2013-07-30 DIAGNOSIS — M19019 Primary osteoarthritis, unspecified shoulder: Secondary | ICD-10-CM

## 2013-07-30 DIAGNOSIS — Z9010 Acquired absence of unspecified breast and nipple: Secondary | ICD-10-CM | POA: Insufficient documentation

## 2013-07-30 DIAGNOSIS — Z853 Personal history of malignant neoplasm of breast: Secondary | ICD-10-CM | POA: Insufficient documentation

## 2013-07-30 DIAGNOSIS — G8929 Other chronic pain: Secondary | ICD-10-CM

## 2013-07-30 DIAGNOSIS — M25519 Pain in unspecified shoulder: Secondary | ICD-10-CM | POA: Insufficient documentation

## 2013-07-30 MED ORDER — TIZANIDINE HCL 4 MG PO TABS
4.0000 mg | ORAL_TABLET | Freq: Three times a day (TID) | ORAL | Status: DC
Start: 2013-07-30 — End: 2013-09-03

## 2013-07-30 NOTE — Progress Notes (Signed)
Here for pill count and medication refills. Did not bring oxycodone bottle today.  Reminded she must do so next visit to get her prescription.  Refill given and tizanidine refilled electronically.  VSS   She is a low fall risk and has had no falls. reviewd and she has been given the handout previously.  Return in one month WITH BOTLE for refill and two months with DR Corene Cornea.

## 2013-07-30 NOTE — Patient Instructions (Addendum)
Follow up one month med mgmnt with RN and 2 months with Dr Letta Pate  BRING OXYCODONE BOTTLE TO APPOINTMENT

## 2013-07-31 ENCOUNTER — Telehealth: Payer: Self-pay | Admitting: *Deleted

## 2013-07-31 NOTE — Telephone Encounter (Signed)
Received refill fax request for 12-hour Suphedrine #10--take 1 tablet every 12 hours prn.  Last filled on 06/15/13.  Will route request to PCP.  Burna Forts, BSN, RN-BC

## 2013-08-01 ENCOUNTER — Ambulatory Visit: Payer: Medicare Other

## 2013-08-01 NOTE — Telephone Encounter (Signed)
This is not a medicine we have ever prescribed for pt. Will not refill.  [name], MD

## 2013-08-03 ENCOUNTER — Telehealth: Payer: Self-pay | Admitting: *Deleted

## 2013-08-03 NOTE — Telephone Encounter (Signed)
Received refill request for 12 hour supherdrine # 10.  Medication is not listed on current med list.  Request placed in provider for review.  Derl Barrow, RN

## 2013-08-03 NOTE — Telephone Encounter (Signed)
This is not a medicine we have written for her in the past. Will not fill.  [name], MD

## 2013-08-05 HISTORY — PX: REDUCTION MAMMAPLASTY: SUR839

## 2013-08-16 ENCOUNTER — Encounter (HOSPITAL_BASED_OUTPATIENT_CLINIC_OR_DEPARTMENT_OTHER): Payer: Self-pay | Admitting: *Deleted

## 2013-08-16 NOTE — Pre-Procedure Instructions (Signed)
To come for BMET 

## 2013-08-17 ENCOUNTER — Other Ambulatory Visit: Payer: Self-pay | Admitting: Oncology

## 2013-08-17 ENCOUNTER — Other Ambulatory Visit: Payer: Self-pay | Admitting: *Deleted

## 2013-08-17 ENCOUNTER — Other Ambulatory Visit: Payer: Self-pay | Admitting: Plastic Surgery

## 2013-08-17 DIAGNOSIS — C50919 Malignant neoplasm of unspecified site of unspecified female breast: Secondary | ICD-10-CM

## 2013-08-17 DIAGNOSIS — Z9011 Acquired absence of right breast and nipple: Secondary | ICD-10-CM

## 2013-08-17 DIAGNOSIS — N6489 Other specified disorders of breast: Secondary | ICD-10-CM

## 2013-08-17 MED ORDER — OXYCODONE HCL 10 MG PO TABS: 10.0000 mg | ORAL_TABLET | Freq: Three times a day (TID) | ORAL | Status: DC | PRN

## 2013-08-17 NOTE — Telephone Encounter (Signed)
RX printed for MD to sign for RN visit 08/21/13

## 2013-08-17 NOTE — H&P (Signed)
Karen Hopkins is an 38 y.o. female.   Chief Complaint: history of breast cancer HPI: The patient is a 38 yrs old bf here for history and physical for right breast reconstruction. The patient had pain and skin thickening with erythema in the right breast in early 2011.  A mammogram showed scattered fibroglandular densities, and an asymmetric density in the right upper outer quadrant with suspicious microcalcifications and enlarged right axillary lymph nodes. The left was normal. The ultrasound showed an ill defined area of hypoechoic tissue and abnormal right axillary lymph nodes. The ultrasound guided biopsy showed a poorly differentiated invasive ductal carcinoma, which was ER poor at 3%, PR positive at 14%, with strong amplification of HER-2.  Bilateral breast MRIs were obtained (2011) and showed a right breast confluent mass and non-mass enhancement. There was marked skin thickening involving the entire right breast. The left breast was unremarkable. She received neoadjuvant chemo. She underwent definitive right modified radical mastectomy in 2011, with a residual 2.5 mm area of tumor in the breast, grade 3, and 1/21 lymph nodes involved. She finished radiation in 2/21012. She was treated for right arm lymphedema. The skin shows signs of radiation damage and hyperpigmentation. Her abdomen is very larger. She had an abdominal hernia repair in the past. The current volume is 510 cc and is tight. The left breast is very ptotic and will need to be reduced for symmetry.   Past Medical History  Diagnosis Date  . Seasonal allergies   . Lymphedema of arm     right; no BP or puncture to right arm  . Depression   . Anxiety   . Sinus headache   . Hypertension     under control with med., has been on med. x 2 yr.  . History of breast cancer 2011    right  . History of chemotherapy 2011  . History of radiation therapy 2011    Past Surgical History  Procedure Laterality Date  . Supra-umbilical hernia   7673  . Nasal septum surgery    . Latissimus flap to breast Right 03/12/2013    Procedure: RIGHT BREAST LATISSIMUS FLAP WITH EXPANDER PLACEMENT;  Surgeon: Theodoro Kos, DO;  Location: [place];  Service: Plastics;  Laterality: Right;  . Cesarean section  07/15/2001; 03/18/2004; 11/21/2008  . Tubal ligation  11/21/2008  . Portacath placement Left 09/12/2009  . Modified radical mastectomy Right 03/11/2010  . Port-a-cath removal Left 12/01/2010    Family History  Problem Relation Age of Onset  . Hypertension Mother   . Diabetes type II Father   . Prostate cancer Father    Social History:  reports that she quit smoking about 3 years ago. She has never used smokeless tobacco. She reports that she does not drink alcohol or use illicit drugs.  Allergies:  Allergies  Allergen Reactions  . Lyrica [Pregabalin] Nausea Only    .  Marland Kitchen Meloxicam Nausea Only  . Penicillins Swelling    FACIAL SWELLING  . Robaxin [Methocarbamol] Nausea Only     (Not in a hospital admission)  No results found for this or any previous visit (from the past 48 hour(s)). No results found.  Review of Systems  Constitutional: Negative.   HENT: Negative.   Eyes: Negative.   Respiratory: Negative.   Cardiovascular: Negative.   Gastrointestinal: Negative.   Genitourinary: Negative.   Musculoskeletal: Negative.   Skin: Negative.   Neurological: Negative.   Psychiatric/Behavioral: Negative.     Last menstrual period 08/12/2013. Physical  Exam  Constitutional: She appears well-developed and well-nourished.  HENT:  Head: Normocephalic and atraumatic.  Eyes: Conjunctivae are normal. Pupils are equal, round, and reactive to light.  Neck: Normal range of motion.  Cardiovascular: Normal rate.   Respiratory: Effort normal.  GI: Soft.  Musculoskeletal: Normal range of motion.  Neurological: She is alert.  Skin: Skin is warm.  Psychiatric: She has a normal mood and affect. Her behavior is normal. Judgment and thought  content normal.     Assessment/Plan Plan for remove of tissue expander from right breast with placement of implant to right breast and left breast reduction.  Ahtanum 08/17/2013, 3:13 PM

## 2013-08-20 ENCOUNTER — Encounter (HOSPITAL_BASED_OUTPATIENT_CLINIC_OR_DEPARTMENT_OTHER)
Admission: RE | Admit: 2013-08-20 | Discharge: 2013-08-20 | Disposition: A | Discharge status: Home / Self Care | Payer: Medicare Other | Source: Ambulatory Visit | Attending: Plastic Surgery | Admitting: Plastic Surgery

## 2013-08-20 ENCOUNTER — Other Ambulatory Visit: Payer: Self-pay | Admitting: Physician Assistant

## 2013-08-20 LAB — BASIC METABOLIC PANEL
BUN: 10 mg/dL (ref 6–23)
CO2: 26 mEq/L (ref 19–32)
Calcium: 9.5 mg/dL (ref 8.4–10.5)
Chloride: 100 mEq/L (ref 96–112)
Creatinine, Ser: 0.67 mg/dL (ref 0.50–1.10)
GFR calc Af Amer: >90 mL/min (ref >90)
GLUCOSE: 98 mg/dL (ref 70–99)
POTASSIUM: 3.6 meq/L — AB (ref 3.7–5.3)
Sodium: 139 mEq/L (ref 137–147)

## 2013-08-21 ENCOUNTER — Encounter: Payer: Self-pay | Admitting: *Deleted

## 2013-08-21 ENCOUNTER — Encounter: Payer: Medicare Other | Attending: Physical Medicine & Rehabilitation | Admitting: *Deleted

## 2013-08-21 VITALS — BP 139/75 | HR 88 | Resp 14

## 2013-08-21 DIAGNOSIS — G8929 Other chronic pain: Secondary | ICD-10-CM

## 2013-08-21 DIAGNOSIS — G894 Chronic pain syndrome: Secondary | ICD-10-CM | POA: Insufficient documentation

## 2013-08-21 DIAGNOSIS — Z9221 Personal history of antineoplastic chemotherapy: Secondary | ICD-10-CM | POA: Insufficient documentation

## 2013-08-21 DIAGNOSIS — Z923 Personal history of irradiation: Secondary | ICD-10-CM | POA: Insufficient documentation

## 2013-08-21 DIAGNOSIS — M19019 Primary osteoarthritis, unspecified shoulder: Secondary | ICD-10-CM

## 2013-08-21 DIAGNOSIS — I10 Essential (primary) hypertension: Secondary | ICD-10-CM | POA: Insufficient documentation

## 2013-08-21 DIAGNOSIS — Z9010 Acquired absence of unspecified breast and nipple: Secondary | ICD-10-CM | POA: Insufficient documentation

## 2013-08-21 DIAGNOSIS — C50919 Malignant neoplasm of unspecified site of unspecified female breast: Secondary | ICD-10-CM | POA: Insufficient documentation

## 2013-08-21 NOTE — Patient Instructions (Signed)
Follow up next month with Dr Letta Pate 09/21/13

## 2013-08-21 NOTE — Progress Notes (Signed)
Here for pill count and medication refills. Oxycodone 10 mg # 63  Fill date   07/30/13 Today NV# 18  VSS  No falls and is low fall risk.  She was educated and given home fall prevention handout at last appt.  She will follow up with Dr Letta Pate next month on 09/21/13.   No changes since last visit.  Not feeling well today because of the changing weather (warm/cold) and also has allergies (which season is beginning).

## 2013-08-23 ENCOUNTER — Encounter (HOSPITAL_BASED_OUTPATIENT_CLINIC_OR_DEPARTMENT_OTHER): Payer: Self-pay | Admitting: *Deleted

## 2013-08-23 ENCOUNTER — Ambulatory Visit (HOSPITAL_BASED_OUTPATIENT_CLINIC_OR_DEPARTMENT_OTHER): Payer: Medicare Other | Admitting: Anesthesiology

## 2013-08-23 ENCOUNTER — Encounter (HOSPITAL_BASED_OUTPATIENT_CLINIC_OR_DEPARTMENT_OTHER): Payer: Medicare Other | Admitting: Anesthesiology

## 2013-08-23 ENCOUNTER — Encounter (HOSPITAL_BASED_OUTPATIENT_CLINIC_OR_DEPARTMENT_OTHER)
Admission: RE | Disposition: A | Discharge status: Home / Self Care | Payer: Self-pay | Source: Ambulatory Visit | Attending: Plastic Surgery

## 2013-08-23 ENCOUNTER — Ambulatory Visit (HOSPITAL_BASED_OUTPATIENT_CLINIC_OR_DEPARTMENT_OTHER)
Admission: RE | Admit: 2013-08-23 | Discharge: 2013-08-24 | Disposition: A | Discharge status: Home / Self Care | Payer: Medicare Other | Source: Ambulatory Visit | Attending: Plastic Surgery | Admitting: Plastic Surgery

## 2013-08-23 DIAGNOSIS — Z923 Personal history of irradiation: Secondary | ICD-10-CM | POA: Insufficient documentation

## 2013-08-23 DIAGNOSIS — F3289 Other specified depressive episodes: Secondary | ICD-10-CM | POA: Insufficient documentation

## 2013-08-23 DIAGNOSIS — F411 Generalized anxiety disorder: Secondary | ICD-10-CM | POA: Insufficient documentation

## 2013-08-23 DIAGNOSIS — Z87891 Personal history of nicotine dependence: Secondary | ICD-10-CM | POA: Insufficient documentation

## 2013-08-23 DIAGNOSIS — Z9011 Acquired absence of right breast and nipple: Secondary | ICD-10-CM

## 2013-08-23 DIAGNOSIS — N6489 Other specified disorders of breast: Secondary | ICD-10-CM

## 2013-08-23 DIAGNOSIS — Z9010 Acquired absence of unspecified breast and nipple: Secondary | ICD-10-CM

## 2013-08-23 DIAGNOSIS — Z9889 Other specified postprocedural states: Secondary | ICD-10-CM

## 2013-08-23 DIAGNOSIS — Z4430 Encounter for fitting and adjustment of external breast prosthesis, unspecified breast: Secondary | ICD-10-CM | POA: Insufficient documentation

## 2013-08-23 DIAGNOSIS — I10 Essential (primary) hypertension: Secondary | ICD-10-CM | POA: Insufficient documentation

## 2013-08-23 DIAGNOSIS — F329 Major depressive disorder, single episode, unspecified: Secondary | ICD-10-CM | POA: Insufficient documentation

## 2013-08-23 DIAGNOSIS — Z853 Personal history of malignant neoplasm of breast: Secondary | ICD-10-CM | POA: Insufficient documentation

## 2013-08-23 HISTORY — DX: Personal history of antineoplastic chemotherapy: Z92.21

## 2013-08-23 HISTORY — DX: Headache, unspecified: R51.9

## 2013-08-23 HISTORY — PX: LIPOSUCTION: SHX10

## 2013-08-23 HISTORY — DX: Personal history of malignant neoplasm of breast: Z85.3

## 2013-08-23 HISTORY — DX: Personal history of irradiation: Z92.3

## 2013-08-23 HISTORY — PX: REMOVAL OF TISSUE EXPANDER AND PLACEMENT OF IMPLANT: SHX6457

## 2013-08-23 HISTORY — DX: Headache: R51

## 2013-08-23 HISTORY — PX: BREAST REDUCTION SURGERY: SHX8

## 2013-08-23 LAB — POCT HEMOGLOBIN-HEMACUE: HEMOGLOBIN: 13.5 g/dL (ref 12.0–15.0)

## 2013-08-23 SURGERY — REMOVAL, TISSUE EXPANDER, BREAST, WITH IMPLANT INSERTION
Anesthesia: General | Site: Breast | Laterality: Right

## 2013-08-23 MED ORDER — POLYMYXIN B SULFATE 500000 UNITS IJ SOLR
INTRAMUSCULAR | Status: DC | PRN
  Administered 2013-08-23: 10:00:00

## 2013-08-23 MED ORDER — LIDOCAINE HCL 1 % IJ SOLN
INTRAVENOUS | Status: DC | PRN
  Administered 2013-08-23: 10:00:00

## 2013-08-23 MED ORDER — DIAZEPAM 2 MG PO TABS
2.0000 mg | ORAL_TABLET | Freq: Three times a day (TID) | ORAL | Status: DC | PRN
  Administered 2013-08-23 – 2013-08-24 (×2): 2 mg via ORAL
  Filled 2013-08-23 (×2): qty 1

## 2013-08-23 MED ORDER — FENTANYL CITRATE 0.05 MG/ML IJ SOLN
INTRAMUSCULAR | Status: AC
  Filled 2013-08-23: qty 2

## 2013-08-23 MED ORDER — HYDROCODONE-ACETAMINOPHEN 5-325 MG PO TABS
1.0000 | ORAL_TABLET | ORAL | Status: DC | PRN
  Administered 2013-08-23: 2 via ORAL
  Filled 2013-08-23: qty 2

## 2013-08-23 MED ORDER — MIDAZOLAM HCL 5 MG/5ML IJ SOLN
INTRAMUSCULAR | Status: DC | PRN
  Administered 2013-08-23: 2 mg via INTRAVENOUS

## 2013-08-23 MED ORDER — DOCUSATE SODIUM 100 MG PO CAPS
100.0000 mg | ORAL_CAPSULE | Freq: Two times a day (BID) | ORAL | Status: DC
  Administered 2013-08-23: 100 mg via ORAL
  Filled 2013-08-23: qty 1

## 2013-08-23 MED ORDER — MORPHINE SULFATE 4 MG/ML IJ SOLN
4.0000 mg | INTRAMUSCULAR | Status: DC | PRN
  Administered 2013-08-23 – 2013-08-24 (×2): 4 mg via INTRAVENOUS
  Filled 2013-08-23 (×2): qty 1

## 2013-08-23 MED ORDER — DIAZEPAM 2 MG PO TABS
2.0000 mg | ORAL_TABLET | Freq: Once | ORAL | Status: AC
  Administered 2013-08-23: 2 mg via ORAL

## 2013-08-23 MED ORDER — HYDROMORPHONE HCL PF 1 MG/ML IJ SOLN
0.2500 mg | INTRAMUSCULAR | Status: DC | PRN
  Administered 2013-08-23 (×4): 0.5 mg via INTRAVENOUS

## 2013-08-23 MED ORDER — DEXAMETHASONE SODIUM PHOSPHATE 4 MG/ML IJ SOLN
INTRAMUSCULAR | Status: DC | PRN
  Administered 2013-08-23: 10 mg via INTRAVENOUS

## 2013-08-23 MED ORDER — OXYCODONE HCL 5 MG/5ML PO SOLN: 5.0000 mg | Freq: Once | ORAL | Status: DC | PRN

## 2013-08-23 MED ORDER — LIDOCAINE HCL (PF) 1 % IJ SOLN
INTRAMUSCULAR | Status: AC
  Filled 2013-08-23: qty 60

## 2013-08-23 MED ORDER — HYDROMORPHONE HCL PF 1 MG/ML IJ SOLN
INTRAMUSCULAR | Status: AC
  Filled 2013-08-23: qty 1

## 2013-08-23 MED ORDER — OXYCODONE-ACETAMINOPHEN 5-325 MG PO TABS
2.0000 | ORAL_TABLET | Freq: Four times a day (QID) | ORAL | Status: DC | PRN
  Administered 2013-08-23 – 2013-08-24 (×3): 2 via ORAL
  Filled 2013-08-23 (×3): qty 2

## 2013-08-23 MED ORDER — LIDOCAINE HCL (CARDIAC) 20 MG/ML IV SOLN
INTRAVENOUS | Status: DC | PRN
  Administered 2013-08-23: 75 mg via INTRAVENOUS

## 2013-08-23 MED ORDER — DIAZEPAM 5 MG/ML IJ SOLN
INTRAMUSCULAR | Status: DC | PRN
  Administered 2013-08-23: 5 mg via INTRAVENOUS

## 2013-08-23 MED ORDER — MIDAZOLAM HCL 2 MG/ML PO SYRP: 12.0000 mg | ORAL_SOLUTION | Freq: Once | ORAL | Status: DC | PRN

## 2013-08-23 MED ORDER — ACETAMINOPHEN 325 MG PO TABS: 650.0000 mg | ORAL_TABLET | Freq: Four times a day (QID) | ORAL | Status: DC | PRN

## 2013-08-23 MED ORDER — KCL IN DEXTROSE-NACL 20-5-0.45 MEQ/L-%-% IV SOLN
INTRAVENOUS | Status: DC
  Administered 2013-08-23: 16:00:00 via INTRAVENOUS
  Filled 2013-08-23: qty 1000

## 2013-08-23 MED ORDER — FENTANYL CITRATE 0.05 MG/ML IJ SOLN
50.0000 ug | Freq: Once | INTRAMUSCULAR | Status: DC
Start: 2013-08-23 — End: 2013-08-23

## 2013-08-23 MED ORDER — LIDOCAINE-EPINEPHRINE 1 %-1:100000 IJ SOLN
INTRAMUSCULAR | Status: AC
  Filled 2013-08-23: qty 1

## 2013-08-23 MED ORDER — PROMETHAZINE HCL 25 MG/ML IJ SOLN: 6.2500 mg | INTRAMUSCULAR | Status: DC | PRN

## 2013-08-23 MED ORDER — FENTANYL CITRATE 0.05 MG/ML IJ SOLN: 50.0000 ug | INTRAMUSCULAR | Status: DC | PRN

## 2013-08-23 MED ORDER — PANTOPRAZOLE SODIUM 40 MG IV SOLR: 40.0000 mg | Freq: Every day | INTRAVENOUS | Status: DC

## 2013-08-23 MED ORDER — DIAZEPAM 5 MG/ML IJ SOLN
INTRAMUSCULAR | Status: AC
  Filled 2013-08-23: qty 2

## 2013-08-23 MED ORDER — PROPOFOL 10 MG/ML IV BOLUS
INTRAVENOUS | Status: DC | PRN
  Administered 2013-08-23: 250 mg via INTRAVENOUS
  Administered 2013-08-23: 50 mg via INTRAVENOUS

## 2013-08-23 MED ORDER — PROMETHAZINE HCL 25 MG/ML IJ SOLN
12.5000 mg | Freq: Once | INTRAMUSCULAR | Status: AC
  Administered 2013-08-23: 12.5 mg via INTRAVENOUS
  Filled 2013-08-23: qty 1

## 2013-08-23 MED ORDER — ACETAMINOPHEN 650 MG RE SUPP: 650.0000 mg | Freq: Four times a day (QID) | RECTAL | Status: DC | PRN

## 2013-08-23 MED ORDER — EPINEPHRINE HCL 1 MG/ML IJ SOLN
INTRAMUSCULAR | Status: AC
  Filled 2013-08-23: qty 1

## 2013-08-23 MED ORDER — MEPERIDINE HCL 25 MG/ML IJ SOLN
25.0000 mg | Freq: Once | INTRAMUSCULAR | Status: AC
  Administered 2013-08-23: 25 mg via INTRAVENOUS

## 2013-08-23 MED ORDER — LABETALOL HCL 5 MG/ML IV SOLN
INTRAVENOUS | Status: DC | PRN
  Administered 2013-08-23: 5 mg via INTRAVENOUS

## 2013-08-23 MED ORDER — CIPROFLOXACIN IN D5W 400 MG/200ML IV SOLN
INTRAVENOUS | Status: AC
  Filled 2013-08-23: qty 200

## 2013-08-23 MED ORDER — OXYCODONE HCL 5 MG PO TABS: 5.0000 mg | ORAL_TABLET | Freq: Once | ORAL | Status: DC | PRN

## 2013-08-23 MED ORDER — CIPROFLOXACIN IN D5W 400 MG/200ML IV SOLN
400.0000 mg | INTRAVENOUS | Status: AC
  Administered 2013-08-23: 400 mg via INTRAVENOUS

## 2013-08-23 MED ORDER — FENTANYL CITRATE 0.05 MG/ML IJ SOLN
INTRAMUSCULAR | Status: AC
  Filled 2013-08-23: qty 4

## 2013-08-23 MED ORDER — ONDANSETRON HCL 4 MG/2ML IJ SOLN: 4.0000 mg | Freq: Four times a day (QID) | INTRAMUSCULAR | Status: DC | PRN

## 2013-08-23 MED ORDER — BUPIVACAINE-EPINEPHRINE PF 0.25-1:200000 % IJ SOLN
INTRAMUSCULAR | Status: AC
  Filled 2013-08-23: qty 30

## 2013-08-23 MED ORDER — SUCCINYLCHOLINE CHLORIDE 20 MG/ML IJ SOLN
INTRAMUSCULAR | Status: DC | PRN
  Administered 2013-08-23: 100 mg via INTRAVENOUS

## 2013-08-23 MED ORDER — FENTANYL CITRATE 0.05 MG/ML IJ SOLN
INTRAMUSCULAR | Status: AC
  Filled 2013-08-23: qty 6

## 2013-08-23 MED ORDER — CIPROFLOXACIN IN D5W 400 MG/200ML IV SOLN
400.0000 mg | Freq: Two times a day (BID) | INTRAVENOUS | Status: DC
  Administered 2013-08-23 – 2013-08-24 (×2): 400 mg via INTRAVENOUS
  Filled 2013-08-23 (×2): qty 200

## 2013-08-23 MED ORDER — MIDAZOLAM HCL 2 MG/2ML IJ SOLN: 1.0000 mg | INTRAMUSCULAR | Status: DC | PRN

## 2013-08-23 MED ORDER — MEPERIDINE HCL 25 MG/ML IJ SOLN
INTRAMUSCULAR | Status: AC
  Filled 2013-08-23: qty 1

## 2013-08-23 MED ORDER — LACTATED RINGERS IV SOLN
INTRAVENOUS | Status: DC
  Administered 2013-08-23 (×2): via INTRAVENOUS

## 2013-08-23 MED ORDER — MIDAZOLAM HCL 2 MG/2ML IJ SOLN
INTRAMUSCULAR | Status: AC
  Filled 2013-08-23: qty 2

## 2013-08-23 MED ORDER — FENTANYL CITRATE 0.05 MG/ML IJ SOLN
INTRAMUSCULAR | Status: DC | PRN
  Administered 2013-08-23: 50 ug via INTRAVENOUS
  Administered 2013-08-23: 25 ug via INTRAVENOUS
  Administered 2013-08-23: 50 ug via INTRAVENOUS
  Administered 2013-08-23: 100 ug via INTRAVENOUS
  Administered 2013-08-23 (×2): 50 ug via INTRAVENOUS
  Administered 2013-08-23: 100 ug via INTRAVENOUS
  Administered 2013-08-23 (×2): 50 ug via INTRAVENOUS
  Administered 2013-08-23: 25 ug via INTRAVENOUS
  Administered 2013-08-23: 50 ug via INTRAVENOUS

## 2013-08-23 SURGICAL SUPPLY — 87 items
ADH SKN CLS APL DERMABOND .7 (GAUZE/BANDAGES/DRESSINGS) ×16
BAG DECANTER FOR FLEXI CONT (MISCELLANEOUS) ×6 IMPLANT
BINDER BREAST LRG (GAUZE/BANDAGES/DRESSINGS) IMPLANT
BINDER BREAST MEDIUM (GAUZE/BANDAGES/DRESSINGS) IMPLANT
BINDER BREAST XLRG (GAUZE/BANDAGES/DRESSINGS) IMPLANT
BINDER BREAST XXLRG (GAUZE/BANDAGES/DRESSINGS) ×12 IMPLANT
BIOPATCH RED 1 DISK 7.0 (GAUZE/BANDAGES/DRESSINGS) ×5 IMPLANT
BIOPATCH RED 1IN DISK 7.0MM (GAUZE/BANDAGES/DRESSINGS) ×1
BLADE HEX COATED 2.75 (ELECTRODE) ×6 IMPLANT
BLADE KNIFE PERSONA 10 (BLADE) ×42 IMPLANT
BLADE SURG 15 STRL LF DISP TIS (BLADE) ×4 IMPLANT
BLADE SURG 15 STRL SS (BLADE) ×6
BNDG GAUZE ELAST 4 BULKY (GAUZE/BANDAGES/DRESSINGS) ×12 IMPLANT
CANISTER LIPO FAT HARVEST (MISCELLANEOUS) IMPLANT
CANISTER SUCT 1200ML W/VALVE (MISCELLANEOUS) ×6 IMPLANT
CANNULA ASPIRATION (CANNULA) ×6 IMPLANT
CHLORAPREP W/TINT 26ML (MISCELLANEOUS) ×6 IMPLANT
CORDS BIPOLAR (ELECTRODE) IMPLANT
COVER MAYO STAND STRL (DRAPES) ×6 IMPLANT
COVER TABLE BACK 60X90 (DRAPES) ×6 IMPLANT
DECANTER SPIKE VIAL GLASS SM (MISCELLANEOUS) IMPLANT
DERMABOND ADVANCED (GAUZE/BANDAGES/DRESSINGS) ×8
DERMABOND ADVANCED .7 DNX12 (GAUZE/BANDAGES/DRESSINGS) ×16 IMPLANT
DRAIN CHANNEL 19F RND (DRAIN) ×6 IMPLANT
DRAPE LAPAROSCOPIC ABDOMINAL (DRAPES) ×6 IMPLANT
DRSG PAD ABDOMINAL 8X10 ST (GAUZE/BANDAGES/DRESSINGS) ×36 IMPLANT
ELECT BLADE 4.0 EZ CLEAN MEGAD (MISCELLANEOUS) ×6
ELECT REM PT RETURN 9FT ADLT (ELECTROSURGICAL) ×6
ELECTRODE BLDE 4.0 EZ CLN MEGD (MISCELLANEOUS) ×4 IMPLANT
ELECTRODE REM PT RTRN 9FT ADLT (ELECTROSURGICAL) ×4 IMPLANT
EVACUATOR SILICONE 100CC (DRAIN) IMPLANT
FILTER LIPOSUCTION (MISCELLANEOUS) ×6 IMPLANT
GLOVE BIO SURGEON STRL SZ 6.5 (GLOVE) ×20 IMPLANT
GLOVE BIO SURGEON STRL SZ8 (GLOVE) ×6 IMPLANT
GLOVE BIO SURGEONS STRL SZ 6.5 (GLOVE) ×4
GLOVE BIOGEL PI IND STRL 7.5 (GLOVE) ×8 IMPLANT
GLOVE BIOGEL PI IND STRL 8 (GLOVE) ×4 IMPLANT
GLOVE BIOGEL PI INDICATOR 7.5 (GLOVE) ×4
GLOVE BIOGEL PI INDICATOR 8 (GLOVE) ×2
GLOVE SURG SS PI 7.5 STRL IVOR (GLOVE) ×12 IMPLANT
GOWN STRL REUS W/ TWL LRG LVL3 (GOWN DISPOSABLE) ×8 IMPLANT
GOWN STRL REUS W/ TWL XL LVL3 (GOWN DISPOSABLE) ×8 IMPLANT
GOWN STRL REUS W/TWL LRG LVL3 (GOWN DISPOSABLE) ×12
GOWN STRL REUS W/TWL XL LVL3 (GOWN DISPOSABLE) ×12
IMPL BREAST GEL 535CC (Breast) ×4 IMPLANT
IMPLANT BREAST GEL 535CC (Breast) ×6 IMPLANT
IV NS 1000ML (IV SOLUTION)
IV NS 1000ML BAXH (IV SOLUTION) IMPLANT
KIT FILL SYSTEM UNIVERSAL (SET/KITS/TRAYS/PACK) ×6 IMPLANT
LINER CANISTER 1000CC FLEX (MISCELLANEOUS) ×6 IMPLANT
NDL SAFETY ECLIPSE 18X1.5 (NEEDLE) ×4 IMPLANT
NEEDLE HYPO 18GX1.5 SHARP (NEEDLE) ×6
NEEDLE HYPO 25X1 1.5 SAFETY (NEEDLE) IMPLANT
NEEDLE SPNL 18GX3.5 QUINCKE PK (NEEDLE) ×6 IMPLANT
NS IRRIG 1000ML POUR BTL (IV SOLUTION) IMPLANT
PACK BASIN DAY SURGERY FS (CUSTOM PROCEDURE TRAY) ×6 IMPLANT
PAD ALCOHOL SWAB (MISCELLANEOUS) ×6 IMPLANT
PENCIL BUTTON HOLSTER BLD 10FT (ELECTRODE) ×6 IMPLANT
PIN SAFETY STERILE (MISCELLANEOUS) ×6 IMPLANT
SIZER BREAST REUSE 535CC (SIZER) ×6
SIZER BRST REUSE ULT HI 535CC (SIZER) ×4 IMPLANT
SIZER GENERIC MENTOR (SIZER) ×6 IMPLANT
SLEEVE SCD COMPRESS KNEE MED (MISCELLANEOUS) ×6 IMPLANT
SPONGE GAUZE 4X4 12PLY STER LF (GAUZE/BANDAGES/DRESSINGS) IMPLANT
SPONGE LAP 18X18 X RAY DECT (DISPOSABLE) ×36 IMPLANT
STRIP SUTURE WOUND CLOSURE 1/2 (SUTURE) IMPLANT
SUT MNCRL AB 4-0 PS2 18 (SUTURE) IMPLANT
SUT MON AB 3-0 SH 27 (SUTURE) ×6
SUT MON AB 3-0 SH27 (SUTURE) ×4 IMPLANT
SUT MON AB 5-0 PS2 18 (SUTURE) ×24 IMPLANT
SUT PDS AB 2-0 CT2 27 (SUTURE) IMPLANT
SUT SILK 3 0 PS 1 (SUTURE) ×6 IMPLANT
SUT VIC AB 3-0 SH 27 (SUTURE) ×54
SUT VIC AB 3-0 SH 27X BRD (SUTURE) ×36 IMPLANT
SUT VIC AB 5-0 PS2 18 (SUTURE) ×12 IMPLANT
SUT VICRYL 4-0 PS2 18IN ABS (SUTURE) ×12 IMPLANT
SYR 20CC LL (SYRINGE) IMPLANT
SYR 50ML LL SCALE MARK (SYRINGE) ×6 IMPLANT
SYR BULB IRRIGATION 50ML (SYRINGE) ×6 IMPLANT
SYR CONTROL 10ML LL (SYRINGE) IMPLANT
SYR TB 1ML LL NO SAFETY (SYRINGE) IMPLANT
TOWEL OR 17X24 6PK STRL BLUE (TOWEL DISPOSABLE) ×12 IMPLANT
TUBE CONNECTING 20'X1/4 (TUBING) ×1
TUBE CONNECTING 20X1/4 (TUBING) ×5 IMPLANT
TUBING SET GRADUATE ASPIR 12FT (MISCELLANEOUS) ×6 IMPLANT
UNDERPAD 30X30 INCONTINENT (UNDERPADS AND DIAPERS) ×12 IMPLANT
YANKAUER SUCT BULB TIP NO VENT (SUCTIONS) ×6 IMPLANT

## 2013-08-23 NOTE — Interval H&P Note (Signed)
History and Physical Interval Note:  08/23/2013 8:28 AM  Karen Hopkins  has presented today for surgery, with the diagnosis of HX OF BREAST CANCER   The various methods of treatment have been discussed with the patient and family. After consideration of risks, benefits and other options for treatment, the patient has consented to  Procedure(s): REMOVAL RIGHT TISSUE EXPANDER AND PLACEMENT OF IMPLANT TO RIGHT BREAST  (Right) LEFT BREAST REDUCTION   (Left) LIPOSUCTION WITH LIPOFILLING (Right) as a surgical intervention .  The patient's history has been reviewed, patient examined, no change in status, stable for surgery.  I have reviewed the patient's chart and labs.  Questions were answered to the patient's satisfaction.     SANGER,Tian Mcmurtrey

## 2013-08-23 NOTE — Anesthesia Preprocedure Evaluation (Addendum)
Anesthesia Evaluation  Patient identified by MRN, date of birth, ID band Patient awake    Reviewed: Allergy & Precautions, H&P , NPO status , Patient's Chart, lab work & pertinent test results, reviewed documented beta blocker date and time   Airway Mallampati: II TM Distance: >3 FB Neck ROM: Full    Dental  (+) Teeth Intact, Dental Advisory Given   Pulmonary former smoker,  breath sounds clear to auscultation        Cardiovascular hypertension, Pt. on medications Rhythm:Regular Rate:Normal     Neuro/Psych  Headaches, PSYCHIATRIC DISORDERS Anxiety Depression    GI/Hepatic negative GI ROS, Neg liver ROS,   Endo/Other  negative endocrine ROSMorbid obesity  Renal/GU negative Renal ROS     Musculoskeletal   Abdominal (+) + obese,   Peds  Hematology negative hematology ROS (+)   Anesthesia Other Findings   Reproductive/Obstetrics                          Anesthesia Physical Anesthesia Plan  ASA: III  Anesthesia Plan: General   Post-op Pain Management:    Induction: Intravenous  Airway Management Planned: Oral ETT  Additional Equipment:   Intra-op Plan:   Post-operative Plan: Extubation in OR  Informed Consent: I have reviewed the patients History and Physical, chart, labs and discussed the procedure including the risks, benefits and alternatives for the proposed anesthesia with the patient or authorized representative who has indicated his/her understanding and acceptance.     Plan Discussed with: CRNA and Surgeon  Anesthesia Plan Comments:         Anesthesia Quick Evaluation

## 2013-08-23 NOTE — Transfer of Care (Signed)
Immediate Anesthesia Transfer of Care Note  Patient: Karen Hopkins  Procedure(s) Performed: Procedure(s): REMOVAL RIGHT TISSUE EXPANDER AND PLACEMENT OF IMPLANT TO RIGHT BREAST  (Right) LEFT BREAST REDUCTION   (Left) LIPOSUCTION (Bilateral)  Patient Location: PACU  Anesthesia Type:General  Level of Consciousness: awake, alert  and oriented  Airway & Oxygen Therapy: Patient Spontanous Breathing and Patient connected to face mask oxygen  Post-op Assessment: Report given to PACU RN and Post -op Vital signs reviewed and stable  Post vital signs: Reviewed and stable  Complications: No apparent anesthesia complications

## 2013-08-23 NOTE — H&P (View-Only) (Signed)
Karen Hopkins is an 38 y.o. female.   Chief Complaint: history of breast cancer HPI: The patient is a 38 yrs old bf here for history and physical for right breast reconstruction. The patient had pain and skin thickening with erythema in the right breast in early 2011.  A mammogram showed scattered fibroglandular densities, and an asymmetric density in the right upper outer quadrant with suspicious microcalcifications and enlarged right axillary lymph nodes. The left was normal. The ultrasound showed an ill defined area of hypoechoic tissue and abnormal right axillary lymph nodes. The ultrasound guided biopsy showed a poorly differentiated invasive ductal carcinoma, which was ER poor at 3%, PR positive at 14%, with strong amplification of HER-2.  Bilateral breast MRIs were obtained (2011) and showed a right breast confluent mass and non-mass enhancement. There was marked skin thickening involving the entire right breast. The left breast was unremarkable. She received neoadjuvant chemo. She underwent definitive right modified radical mastectomy in 2011, with a residual 2.5 mm area of tumor in the breast, grade 3, and 1/21 lymph nodes involved. She finished radiation in 2/21012. She was treated for right arm lymphedema. The skin shows signs of radiation damage and hyperpigmentation. Her abdomen is very larger. She had an abdominal hernia repair in the past. The current volume is 510 cc and is tight. The left breast is very ptotic and will need to be reduced for symmetry.   Past Medical History  Diagnosis Date  . Seasonal allergies   . Lymphedema of arm     right; no BP or puncture to right arm  . Depression   . Anxiety   . Sinus headache   . Hypertension     under control with med., has been on med. x 2 yr.  . History of breast cancer 2011    right  . History of chemotherapy 2011  . History of radiation therapy 2011    Past Surgical History  Procedure Laterality Date  . Supra-umbilical hernia   3536  . Nasal septum surgery    . Latissimus flap to breast Right 03/12/2013    Procedure: RIGHT BREAST LATISSIMUS FLAP WITH EXPANDER PLACEMENT;  Surgeon: Theodoro Kos, DO;  Location: [place];  Service: Plastics;  Laterality: Right;  . Cesarean section  07/15/2001; 03/18/2004; 11/21/2008  . Tubal ligation  11/21/2008  . Portacath placement Left 09/12/2009  . Modified radical mastectomy Right 03/11/2010  . Port-a-cath removal Left 12/01/2010    Family History  Problem Relation Age of Onset  . Hypertension Mother   . Diabetes type II Father   . Prostate cancer Father    Social History:  reports that she quit smoking about 3 years ago. She has never used smokeless tobacco. She reports that she does not drink alcohol or use illicit drugs.  Allergies:  Allergies  Allergen Reactions  . Lyrica [Pregabalin] Nausea Only    .  Marland Kitchen Meloxicam Nausea Only  . Penicillins Swelling    FACIAL SWELLING  . Robaxin [Methocarbamol] Nausea Only     (Not in a hospital admission)  No results found for this or any previous visit (from the past 48 hour(s)). No results found.  Review of Systems  Constitutional: Negative.   HENT: Negative.   Eyes: Negative.   Respiratory: Negative.   Cardiovascular: Negative.   Gastrointestinal: Negative.   Genitourinary: Negative.   Musculoskeletal: Negative.   Skin: Negative.   Neurological: Negative.   Psychiatric/Behavioral: Negative.     Last menstrual period 08/12/2013. Physical  Exam  Constitutional: She appears well-developed and well-nourished.  HENT:  Head: Normocephalic and atraumatic.  Eyes: Conjunctivae are normal. Pupils are equal, round, and reactive to light.  Neck: Normal range of motion.  Cardiovascular: Normal rate.   Respiratory: Effort normal.  GI: Soft.  Musculoskeletal: Normal range of motion.  Neurological: She is alert.  Skin: Skin is warm.  Psychiatric: She has a normal mood and affect. Her behavior is normal. Judgment and thought  content normal.     Assessment/Plan Plan for remove of tissue expander from right breast with placement of implant to right breast and left breast reduction.  Ahtanum 08/17/2013, 3:13 PM

## 2013-08-23 NOTE — Anesthesia Postprocedure Evaluation (Signed)
  Anesthesia Post-op Note  Patient: Karen Hopkins  Procedure(s) Performed: Procedure(s): REMOVAL RIGHT TISSUE EXPANDER AND PLACEMENT OF IMPLANT TO RIGHT BREAST  (Right) LEFT BREAST REDUCTION   (Left) LIPOSUCTION (Bilateral)  Patient Location: PACU  Anesthesia Type:General  Level of Consciousness: awake  Airway and Oxygen Therapy: Patient Spontanous Breathing  Post-op Pain: mild  Post-op Assessment: Post-op Vital signs reviewed, Patient's Cardiovascular Status Stable, Respiratory Function Stable, Patent Airway, No signs of Nausea or vomiting and Pain level controlled  Post-op Vital Signs: Reviewed and stable  Complications: No apparent anesthesia complications

## 2013-08-23 NOTE — Brief Op Note (Signed)
08/23/2013  12:37 PM  PATIENT:  Karen Hopkins  38 y.o. female  PRE-OPERATIVE DIAGNOSIS:  HX OF BREAST CANCER   POST-OPERATIVE DIAGNOSIS:  HX OF BREAST CANCER   PROCEDURE:  Procedure(s): REMOVAL RIGHT TISSUE EXPANDER AND PLACEMENT OF IMPLANT TO RIGHT BREAST  (Right) LEFT BREAST REDUCTION   (Left) LIPOSUCTION (Bilateral)  SURGEON:  Surgeon(s) and Role:    * Briceyda Abdullah Sanger, DO - Primary  PHYSICIAN ASSISTANT: Shawn Rayburn, PA  ASSISTANTS: none   ANESTHESIA:   general  EBL:  Total I/O In: 1300 [I.V.:1300] Out: 175 [Blood:175]  BLOOD ADMINISTERED:none  DRAINS: (1) Jackson-Pratt drain(s) with closed bulb suction in the left breast pocket   LOCAL MEDICATIONS USED:  LIDOCAINE   SPECIMEN:  Source of Specimen:  left breast tissue  DISPOSITION OF SPECIMEN:  PATHOLOGY  COUNTS:  YES  TOURNIQUET:  * No tourniquets in log *  DICTATION: .Dragon Dictation  PLAN OF CARE: Discharge to home after PACU  PATIENT DISPOSITION:  PACU - hemodynamically stable.   Delay start of Pharmacological VTE agent (>24hrs) due to surgical blood loss or risk of bleeding: no

## 2013-08-23 NOTE — Progress Notes (Signed)
Dr. Leafy Ro PA  Shawn notified of patients status.  Serosangious drainage noted around JP site, saturating one half of an abdominal pad.  JP drains are patent and draining serosagious fluid.  Palpation to site and surrounding area is soft and non tender.  Instructed to monitor site and drainage closely and to notify Dr. Migdalia Dk of status if deteriorating.  VSS.  Report given to [facility] nurse.

## 2013-08-23 NOTE — Anesthesia Procedure Notes (Signed)
Procedure Name: Intubation Date/Time: 08/23/2013 9:06 AM Performed by: Melynda Ripple D Pre-anesthesia Checklist: Patient identified, Emergency Drugs available, Suction available and Patient being monitored Patient Re-evaluated:Patient Re-evaluated prior to inductionOxygen Delivery Method: Circle System Utilized Preoxygenation: Pre-oxygenation with 100% oxygen Intubation Type: IV induction Ventilation: Mask ventilation without difficulty Laryngoscope Size: Mac and 3 Grade View: Grade I Tube type: Oral Number of attempts: 1 Airway Equipment and Method: stylet and oral airway Placement Confirmation: ETT inserted through vocal cords under direct vision,  positive ETCO2 and breath sounds checked- equal and bilateral Tube secured with: Tape Dental Injury: Teeth and Oropharynx as per pre-operative assessment

## 2013-08-23 NOTE — Op Note (Signed)
Op note   DATE OF PROCEDURE: 08/23/2013  LOCATION: [place]  SURGEON: Lyndee Leo Sanger  ASSISTANT:Shawn Rayburn, [place]  PREOPERATIVE DIAGNOSIS 1. History of breast cancer.  2. Right breast acquired absence after treatment for breast cancer. 3. Left Breast Asymmetry after right breast reconstruction  POSTOPERATIVE DIAGNOSIS same  PROCEDURES 1. Right breast reconstruction with removal of right breast expander, placement of implant silicone implant and liposuction for symmetry 2. Left breast reduction for symmetry, Left reduction 1338g 3. Right breast capsulotomy for implant respositioning.  COMPLICATIONS: None.  DRAINS: 1 in left breast  IMPLANTS: Right - [employer] Smooth Round High Profile Gel 535cc. Ref #[id number].  Serial Number [id number]  INDICATIONS FOR PROCEDURE:  The patient, Karen Hopkins, is a 38 y.o. female born on 1976-05-30, is here for treatment for further treatment after a mastectomy and placement of a tissue expander. She now presents for exchange of her expander for an implant.  She requires capsulotomies to better position the implant and a left breast reduction for symmetry.   CONSENT:  Informed consent was obtained directly from the patient. Risks, benefits and alternatives were fully discussed. Specific risks including but not limited to bleeding, infection, hematoma, seroma, scarring, pain, implant infection, implant extrusion, capsular contracture, asymmetry, wound healing problems, and need for further surgery were all discussed. The patient did have an ample opportunity to have her questions answered to her satisfaction.   DESCRIPTION OF PROCEDURE:  The patient was taken to the operating room. SCDs were placed and IV antibiotics were given. The patient's chest was prepped and draped in a sterile fashion. A time out was performed and the implants to be used were identified.  One percent Xylocaine with epinephrine was used to infiltrate the area.    The old mastectomy scar was opened and the inferior mastectomy flaps was raised. The latissimus muscle was split to expose the tissue expander which was removed. Inspection of the pocket showed a normal healthy capsule.  The capsule was release circumferencially allow for breast pocket expansion.  Measurements were made and the sizer used to select the implant.  Hemostasis was ensured. The pocket was irrigated with antibiotic solution.  Gloves were changed. The implant was placed in the pocket and oriented appropriately. The latissimus muscle and capsule was closed with a 3-0 running Vicryl suture. The remaining skin was closed with 4-0 Monocryl deep dermal and 5-0 Monocryl subcuticular stitches.  The lateral aspect of each breast was infused with tumescent and liposuction done for symmetry and contour.  On the left breast, preoperative markings were confirmed.  Incision lines were injected with 1% Xylocaine with epinephrine.  After waiting for vasoconstriction, the marked lines were incised.  An inferior pedical breast reduction was performed by de-epithelializing the pedicle, using bovie to create the lateral and medial pedicles, and removing breast tissue from the superior, lateral, and medial portions of the breast.  Care was taken to not undermine the breast pedicle. Hemostasis was achieved.  The nipple was gently rotated into position and the skin was temporarily closed with staples.  The patient was sat upright and size and shape symmetry was confirmed.  The pocket was irrigated, a drain, and hemostasis confirmed.  The deep tissues were approximated with 3-0 Vicryl sutures and the skin was closed with deep dermal and subcuticular 4-0 Monocryl sutures followed by 5-0 Monocryl.  Liposuction was done on the left lateral aspect for symmetry and contour. The nipple areola complex was brought out with the skin de-epithelialized at the  location to make place for the complex.  The area was secured with 4-0  Vicryl at the deep layers followed by 5-0 Monocryl.  The nipple and skin flaps had good capillary refill at the end of the procedure. The patient tolerated the procedure well. A breast binder and ABDs were placed.  The patient was allowed to wake from anesthesia and taken to the recovery room in satisfactory condition.

## 2013-08-24 ENCOUNTER — Telehealth: Payer: Self-pay | Admitting: *Deleted

## 2013-08-24 ENCOUNTER — Encounter (HOSPITAL_BASED_OUTPATIENT_CLINIC_OR_DEPARTMENT_OTHER): Payer: Self-pay | Admitting: Plastic Surgery

## 2013-08-24 NOTE — Addendum Note (Signed)
Addendum created 08/24/13 0223 by Willa Frater, CRNA   Modules edited: Charges VN

## 2013-08-24 NOTE — Addendum Note (Signed)
Addendum created 08/24/13 1217 by Tawni Millers, CRNA   Modules edited: Charges VN

## 2013-08-24 NOTE — Discharge Instructions (Signed)
Sink bath Continue binder   About my Jackson-Pratt Bulb Drain  What is a Jackson-Pratt bulb? A Jackson-Pratt is a soft, round device used to collect drainage. It is connected to a long, thin drainage catheter, which is held in place by one or two small stiches near your surgical incision site. When the bulb is squeezed, it forms a vacuum, forcing the drainage to empty into the bulb.  Emptying the Jackson-Pratt bulb- To empty the bulb: 1. Release the plug on the top of the bulb. 2. Pour the bulb's contents into a measuring container which your nurse will provide. 3. Record the time emptied and amount of drainage. Empty the drain(s) as often as your     doctor or nurse recommends.  Date                  Time                    Amount (Drain 1)                 Amount (Drain 2)  _____________________________________________________________________  _____________________________________________________________________  _____________________________________________________________________  _____________________________________________________________________  _____________________________________________________________________  _____________________________________________________________________  _____________________________________________________________________  _____________________________________________________________________  Squeezing the Jackson-Pratt Bulb- To squeeze the bulb: 1. Make sure the plug at the top of the bulb is open. 2. Squeeze the bulb tightly in your fist. You will hear air squeezing from the bulb. 3. Replace the plug while the bulb is squeezed. 4. Use a safety pin to attach the bulb to your clothing. This will keep the catheter from     pulling at the bulb insertion site.  When to call your doctor- Call your doctor if:  Drain site becomes red, swollen or hot.  You have a fever greater than 101 degrees F.  There is oozing at the drain  site.  Drain falls out (apply a guaze bandage over the drain hole and secure it with tape).  Drainage increases daily not related to activity patterns. (You will usually have more drainage when you are active than when you are resting.)  Drainage has a bad odor.

## 2013-08-24 NOTE — Telephone Encounter (Signed)
[place] from Dr. Joanna Hews office called requesting appt for pt for possible seizure disorder.  Pt has an upcoming appt with PCP on 09/12/13 at 3 PM for physical.  [place] will contact pt to see if she wants something sooner.  Appt number given.  Derl Barrow, RN

## 2013-08-24 NOTE — Addendum Note (Signed)
Addendum created 08/24/13 9983 by Willa Frater, CRNA   Modules edited: Charges VN

## 2013-08-28 ENCOUNTER — Other Ambulatory Visit: Payer: Self-pay | Admitting: Oncology

## 2013-08-28 DIAGNOSIS — C50919 Malignant neoplasm of unspecified site of unspecified female breast: Secondary | ICD-10-CM

## 2013-08-30 ENCOUNTER — Other Ambulatory Visit: Payer: Self-pay

## 2013-08-31 ENCOUNTER — Telehealth: Payer: Self-pay

## 2013-08-31 DIAGNOSIS — Z9882 Breast implant status: Secondary | ICD-10-CM | POA: Insufficient documentation

## 2013-08-31 NOTE — Telephone Encounter (Signed)
Pharmacy is requesting quantity of 90 of patients tizanidine instead of 30.  Directions are 1 tab tid.  Please advise.

## 2013-08-31 NOTE — Telephone Encounter (Signed)
May Change tizanidine to #90

## 2013-09-03 ENCOUNTER — Other Ambulatory Visit: Payer: Self-pay | Admitting: Oncology

## 2013-09-03 DIAGNOSIS — R51 Headache: Secondary | ICD-10-CM

## 2013-09-03 DIAGNOSIS — C50919 Malignant neoplasm of unspecified site of unspecified female breast: Secondary | ICD-10-CM

## 2013-09-03 MED ORDER — TIZANIDINE HCL 4 MG PO TABS: 4.0000 mg | ORAL_TABLET | Freq: Three times a day (TID) | ORAL | Status: DC

## 2013-09-03 NOTE — Telephone Encounter (Signed)
Tizanidine refill sent to pharmacy with a quantity of 90.

## 2013-09-04 ENCOUNTER — Telehealth: Payer: Self-pay | Admitting: Oncology

## 2013-09-04 NOTE — Telephone Encounter (Signed)
, °

## 2013-09-12 ENCOUNTER — Encounter: Payer: Medicare Other | Admitting: Family Medicine

## 2013-09-20 ENCOUNTER — Ambulatory Visit (HOSPITAL_BASED_OUTPATIENT_CLINIC_OR_DEPARTMENT_OTHER): Payer: Medicare Other | Admitting: Physical Medicine & Rehabilitation

## 2013-09-20 ENCOUNTER — Encounter: Payer: Medicare Other | Attending: Physical Medicine and Rehabilitation

## 2013-09-20 ENCOUNTER — Encounter: Payer: Self-pay | Admitting: Physical Medicine & Rehabilitation

## 2013-09-20 VITALS — BP 132/73 | HR 89 | Resp 14 | Ht 64.0 in | Wt 262.0 lb

## 2013-09-20 DIAGNOSIS — M7918 Myalgia, other site: Secondary | ICD-10-CM

## 2013-09-20 DIAGNOSIS — C50919 Malignant neoplasm of unspecified site of unspecified female breast: Secondary | ICD-10-CM | POA: Insufficient documentation

## 2013-09-20 DIAGNOSIS — G893 Neoplasm related pain (acute) (chronic): Secondary | ICD-10-CM | POA: Insufficient documentation

## 2013-09-20 DIAGNOSIS — M549 Dorsalgia, unspecified: Secondary | ICD-10-CM | POA: Insufficient documentation

## 2013-09-20 DIAGNOSIS — Z9889 Other specified postprocedural states: Secondary | ICD-10-CM | POA: Insufficient documentation

## 2013-09-20 DIAGNOSIS — IMO0001 Reserved for inherently not codable concepts without codable children: Secondary | ICD-10-CM

## 2013-09-20 MED ORDER — OXYCODONE HCL 10 MG PO TABS: 10.0000 mg | ORAL_TABLET | Freq: Three times a day (TID) | ORAL | Status: DC | PRN

## 2013-09-20 NOTE — Progress Notes (Signed)
Subjective:    Patient ID: Karen Hopkins, female    DOB: 1976/05/16, 38 y.o.   MRN: [id number]  HPI Interval medical history: Removal of right tissue expander and left breast reduction on 08/23/2013 Postop head oxycodone every 4 hours and had one prescription from surgery, now on usual dose of q. 8 hours Zanaflex 4 mg very effective for muscle spasms, working better than methocarbamol Still having difficulty with forward flexing the right arm. Feels like she needs therapy. Patient has overhead lifting restrictions, as well as lifting heavy weights from the ground  Pain Inventory Average Pain 8 Pain Right Now 7 My pain is constant, sharp, dull, stabbing, tingling and aching  In the last 24 hours, has pain interfered with the following? General activity 4 Relation with others 4 Enjoyment of life 4 What TIME of day is your pain at its worst? all day Sleep (in general) Poor  Pain is worse with: walking, bending, sitting, inactivity and some activites Pain improves with: therapy/exercise and medication Relief from Meds: 7  Mobility walk without assistance do you drive?  yes transfers alone Do you have any goals in this area?  no  Function Do you have any goals in this area?  no  Neuro/Psych spasms confusion depression anxiety  Prior Studies Any changes since last visit?  no  Physicians involved in your care Any changes since last visit?  no   Family History  Problem Relation Age of Onset  . Hypertension Mother   . Diabetes type II Father   . Prostate cancer Father    History   Social History  . Marital Status: Single    Spouse Name: N/A    Number of Children: N/A  . Years of Education: N/A   Social History Main Topics  . Smoking status: Former Smoker -- 0.25 packs/day for 5 years    Quit date: 04/26/2010  . Smokeless tobacco: Never Used  . Alcohol Use: No  . Drug Use: No  . Sexual Activity: Yes   Other Topics Concern  . None   Social History  Narrative  . None   Past Surgical History  Procedure Laterality Date  . Supra-umbilical hernia  1324  . Nasal septum surgery    . Latissimus flap to breast Right 03/12/2013    Procedure: RIGHT BREAST LATISSIMUS FLAP WITH EXPANDER PLACEMENT;  Surgeon: Theodoro Kos, DO;  Location: [facility];  Service: Plastics;  Laterality: Right;  . Cesarean section  07/15/2001; 03/18/2004; 11/21/2008  . Tubal ligation  11/21/2008  . Portacath placement Left 09/12/2009  . Modified radical mastectomy Right 03/11/2010  . Port-a-cath removal Left 12/01/2010  . Removal of tissue expander and placement of implant Right 08/23/2013    Procedure: REMOVAL RIGHT TISSUE EXPANDER AND PLACEMENT OF IMPLANT TO RIGHT BREAST ;  Surgeon: Theodoro Kos, DO;  Location: [facility];  Service: Plastics;  Laterality: Right;  . Breast reduction surgery Left 08/23/2013    Procedure: LEFT BREAST REDUCTION  ;  Surgeon: Theodoro Kos, DO;  Location: [facility];  Service: Plastics;  Laterality: Left;  . Liposuction Bilateral 08/23/2013    Procedure: LIPOSUCTION;  Surgeon: Theodoro Kos, DO;  Location: [facility];  Service: Plastics;  Laterality: Bilateral;   Past Medical History  Diagnosis Date  . Seasonal allergies   . Lymphedema of arm     right; no BP or puncture to right arm  . Depression   . Anxiety   . Sinus headache   .  Hypertension     under control with med., has been on med. x 2 yr.  . History of breast cancer 2011    right  . History of chemotherapy 2011  . History of radiation therapy 2011   BP 132/73  Pulse 89  Resp 14  Ht 5\' 4"  (1.626 m)  Wt 262 lb (118.842 kg)  BMI 44.95 kg/m2  SpO2 100%  Opioid Risk Score:   Fall Risk Score: Low Fall Risk (0-5 points) (pt educated and given brochure on fall risk previously)    Review of Systems  Constitutional: Positive for unexpected weight change.  Endocrine:       High blood sugar  Neurological:       Spasms    Psychiatric/Behavioral: Positive for confusion. The patient is nervous/anxious.        Depression  All other systems reviewed and are negative.      Objective:   Physical Exam Right shoulder abduction to 90, right shoulder flexion to 90 accompanied by pain Left upper extremity full range of motion Right axillary surgical scarring well-healed Right breast to surgical scars Well-healed lower aspect and underneath breast Right upper extremity 4/5 in the biceps triceps grip Right upper extremity fine motor normal Mild numbness right C7       Assessment & Plan:  1.  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

## 2013-09-20 NOTE — Patient Instructions (Signed)
Myofascial Pain Syndrome  Myofascial pain syndrome is a pain disorder. This pain may be felt in the muscles. It may come and go. Myofascial pain syndrome always has trigger or tender points in the muscle that will cause pain when pressed.   CAUSES  Myofascial pain may be caused by injuries, especially auto accidents, or by overuse of certain muscles. Typically the pain is long lasting. It is made worse by overuse of the involved muscles, emotional distress, and by cold, damp weather. Myofascial pain syndrome often develops in patients whose response to stress is an increase in muscle tone, and is seen in greater frequency in patients with pre-existing tension headaches.  SYMPTOMS   Myofascial pain syndrome causes a wide variety of symptoms. You may see tight ropy bands of muscle. Problems may also include aching, cramping, burning, numbness, tingling, and other uncomfortable sensations in muscular areas. It most commonly affects the neck, upper back, and shoulder areas. Pain often radiates into the arms and hands.   TREATMENT  Treatment includes resting the affected muscular area and applying ice packs to reduce spasm and pain. Trigger point injection, is a valuable initial therapy. This therapy is an injection of local anesthetic directly into the trigger point. Trigger points are often present at the source of pain. Pain relief following injection confirms the diagnosis of myofascial pain syndrome. Fairly vigorous therapy can be carried out during the pain-free period after each injection. Stretching exercises to loosen up the muscles are also useful. Transcutaneous electrical nerve stimulation (TENS) may provide relief from pain. TENS is the use of electric current produced by a device to stimulate the nerves. Ultrasound therapy applied directly over the affected muscle may also provide pain relief. Anti-inflammatory pain medicine can be helpful. Symptoms will gradually improve over a period of weeks to months  with proper treatment.  HOME CARE INSTRUCTIONS  Call your caregiver for follow-up care as recommended.   SEEK MEDICAL CARE IF:   Your pain is severe and not helped with medications.  Document Released: 07/01/2004 Document Revised: 08/16/2011 Document Reviewed: 07/10/2010  Exit[employer]® Patient Information ©2014 [employer].

## 2013-09-21 ENCOUNTER — Ambulatory Visit: Payer: Medicare Other | Admitting: Physical Medicine & Rehabilitation

## 2013-09-26 ENCOUNTER — Ambulatory Visit: Payer: Medicare Other | Attending: Physical Medicine & Rehabilitation | Admitting: Physical Therapy

## 2013-09-26 DIAGNOSIS — M24519 Contracture, unspecified shoulder: Secondary | ICD-10-CM | POA: Insufficient documentation

## 2013-09-26 DIAGNOSIS — M25519 Pain in unspecified shoulder: Secondary | ICD-10-CM | POA: Insufficient documentation

## 2013-09-26 DIAGNOSIS — IMO0001 Reserved for inherently not codable concepts without codable children: Secondary | ICD-10-CM | POA: Insufficient documentation

## 2013-09-27 ENCOUNTER — Other Ambulatory Visit: Payer: Self-pay | Admitting: *Deleted

## 2013-09-27 DIAGNOSIS — C50919 Malignant neoplasm of unspecified site of unspecified female breast: Secondary | ICD-10-CM

## 2013-09-27 MED ORDER — ALPRAZOLAM ER 1 MG PO TB24: 1.0000 mg | ORAL_TABLET | Freq: Every day | ORAL | Status: DC

## 2013-09-28 ENCOUNTER — Encounter: Payer: Medicare Other | Admitting: Family Medicine

## 2013-10-03 ENCOUNTER — Ambulatory Visit: Payer: Medicare Other | Admitting: Physical Therapy

## 2013-10-03 ENCOUNTER — Encounter: Payer: Medicare Other | Admitting: Emergency Medicine

## 2013-10-04 ENCOUNTER — Encounter: Payer: Self-pay | Admitting: Family Medicine

## 2013-10-04 ENCOUNTER — Other Ambulatory Visit (HOSPITAL_COMMUNITY)
Admission: RE | Admit: 2013-10-04 | Discharge: 2013-10-04 | Disposition: A | Discharge status: Home / Self Care | Payer: Medicare Other | Source: Ambulatory Visit | Attending: Family Medicine | Admitting: Family Medicine

## 2013-10-04 ENCOUNTER — Ambulatory Visit (INDEPENDENT_AMBULATORY_CARE_PROVIDER_SITE_OTHER): Payer: Medicare Other | Admitting: Family Medicine

## 2013-10-04 VITALS — BP 131/82 | HR 106 | Temp 98.9°F | Ht 64.0 in | Wt 256.0 lb

## 2013-10-04 DIAGNOSIS — Z01419 Encounter for gynecological examination (general) (routine) without abnormal findings: Secondary | ICD-10-CM

## 2013-10-04 DIAGNOSIS — R7309 Other abnormal glucose: Secondary | ICD-10-CM

## 2013-10-04 DIAGNOSIS — R739 Hyperglycemia, unspecified: Secondary | ICD-10-CM

## 2013-10-04 DIAGNOSIS — N898 Other specified noninflammatory disorders of vagina: Secondary | ICD-10-CM

## 2013-10-04 DIAGNOSIS — Z124 Encounter for screening for malignant neoplasm of cervix: Secondary | ICD-10-CM | POA: Insufficient documentation

## 2013-10-04 DIAGNOSIS — Z113 Encounter for screening for infections with a predominantly sexual mode of transmission: Secondary | ICD-10-CM | POA: Insufficient documentation

## 2013-10-04 DIAGNOSIS — R569 Unspecified convulsions: Secondary | ICD-10-CM

## 2013-10-04 DIAGNOSIS — Z1151 Encounter for screening for human papillomavirus (HPV): Secondary | ICD-10-CM | POA: Insufficient documentation

## 2013-10-04 DIAGNOSIS — Z0000 Encounter for general adult medical examination without abnormal findings: Secondary | ICD-10-CM

## 2013-10-04 LAB — POCT GLYCOSYLATED HEMOGLOBIN (HGB A1C): Hemoglobin A1C: 6.2

## 2013-10-04 MED ORDER — METFORMIN HCL 500 MG PO TABS: 500.0000 mg | ORAL_TABLET | Freq: Every day | ORAL | Status: DC

## 2013-10-04 NOTE — Patient Instructions (Signed)
Start taking Metformin with breakfast. We will recheck in 3 months.  I have sent referral for a neurology appointment. WE will call you with that appointment.  If you pap is abnormal I will call you.  Markell Schrier M. [place], M.D.

## 2013-10-05 ENCOUNTER — Ambulatory Visit: Payer: Medicare Other | Attending: Physical Medicine & Rehabilitation | Admitting: Physical Therapy

## 2013-10-05 DIAGNOSIS — M25519 Pain in unspecified shoulder: Secondary | ICD-10-CM | POA: Insufficient documentation

## 2013-10-05 DIAGNOSIS — M24519 Contracture, unspecified shoulder: Secondary | ICD-10-CM | POA: Insufficient documentation

## 2013-10-05 DIAGNOSIS — IMO0001 Reserved for inherently not codable concepts without codable children: Secondary | ICD-10-CM | POA: Insufficient documentation

## 2013-10-05 LAB — CERVICOVAGINAL ANCILLARY ONLY
CHLAMYDIA, DNA PROBE: NEGATIVE
NEISSERIA GONORRHEA: NEGATIVE

## 2013-10-06 DIAGNOSIS — R569 Unspecified convulsions: Secondary | ICD-10-CM | POA: Insufficient documentation

## 2013-10-06 DIAGNOSIS — Z01419 Encounter for gynecological examination (general) (routine) without abnormal findings: Secondary | ICD-10-CM | POA: Insufficient documentation

## 2013-10-06 NOTE — Assessment & Plan Note (Signed)
A: No further activity but concerning that she had a seizure during surgery  P; - Refer to neuro for further work up and evaluation - Go to ED if she has prolonged seizure.

## 2013-10-06 NOTE — Assessment & Plan Note (Signed)
A: A1C 6.5  P - Metformin 500mg  daily for now, increase to BID if tolerates well - F/u 1 month

## 2013-10-06 NOTE — Progress Notes (Signed)
Patient ID: Karen Hopkins, female   DOB: 11/10/1975, 38 y.o.   MRN: [id number]    Subjective: HPI: Patient is a 38 y.o. female presenting to clinic today for annual exam. Concerns today include peri-op seizures  1. Annual exam- Due to pap today. Should have yearly pap due to history of breast cancer. No vaginal discharge. WOuld like to be check for STD  2. Seizure- Patient experienced peri-operative seizures during her breast reconstruction surgery. It has been recommended that she have this worked up. NO further activity. Never had seizures before. Feels well.  3. Hyperglycemia- LAst A1C at pre-diabetic level. She has watched her diet and would like repeat A1C today.   History Reviewed: Former smoker. Health Maintenance: Pap today, A1C today.   ROS: Please see HPI above.  Objective: Office vital signs reviewed. BP 131/82  Pulse 106  Temp(Src) 98.9 F (37.2 C) (Oral)  Ht 5\' 4"  (1.626 m)  Wt 256 lb (116.121 kg)  BMI 43.92 kg/m2  LMP 09/05/2013  Physical Examination:  General: Awake, alert. NAD HEENT: Atraumatic, normocephalic Neck: No masses palpated. No LAD Pulm: CTAB, no wheezes Cardio: RRR, no murmurs appreciated Breasts- Radiation changes on the right with implant. Left incisions healing well GU: Normal female. No discharge noted. Cervix wnl, no CMT. Abdomen:+BS, soft, nontender, nondistended Extremities: No edema Neuro: Grossly intact  Assessment: 38 y.o. female annual exam  Plan: See Problem List and After Visit Summary

## 2013-10-09 ENCOUNTER — Encounter: Payer: Self-pay | Admitting: Family Medicine

## 2013-10-10 ENCOUNTER — Ambulatory Visit: Payer: Medicare Other

## 2013-10-12 ENCOUNTER — Ambulatory Visit: Payer: Medicare Other | Admitting: Physical Therapy

## 2013-10-15 ENCOUNTER — Encounter: Payer: Medicare Other | Admitting: Neurology

## 2013-10-15 NOTE — Progress Notes (Signed)
This encounter was created in error - please disregard.

## 2013-10-16 ENCOUNTER — Ambulatory Visit: Payer: Medicare Other

## 2013-10-18 ENCOUNTER — Encounter: Payer: Self-pay | Admitting: Registered Nurse

## 2013-10-18 ENCOUNTER — Encounter: Payer: Medicare Other | Attending: Physical Medicine & Rehabilitation | Admitting: Registered Nurse

## 2013-10-18 ENCOUNTER — Ambulatory Visit: Payer: Medicare Other | Admitting: Physical Therapy

## 2013-10-18 VITALS — BP 144/77 | HR 87 | Resp 14 | Ht 64.0 in | Wt 264.0 lb

## 2013-10-18 DIAGNOSIS — G893 Neoplasm related pain (acute) (chronic): Secondary | ICD-10-CM

## 2013-10-18 DIAGNOSIS — Z79899 Other long term (current) drug therapy: Secondary | ICD-10-CM

## 2013-10-18 DIAGNOSIS — Z5181 Encounter for therapeutic drug level monitoring: Secondary | ICD-10-CM

## 2013-10-18 DIAGNOSIS — M7918 Myalgia, other site: Secondary | ICD-10-CM

## 2013-10-18 DIAGNOSIS — M25519 Pain in unspecified shoulder: Secondary | ICD-10-CM | POA: Insufficient documentation

## 2013-10-18 DIAGNOSIS — Z853 Personal history of malignant neoplasm of breast: Secondary | ICD-10-CM | POA: Insufficient documentation

## 2013-10-18 DIAGNOSIS — Z9010 Acquired absence of unspecified breast and nipple: Secondary | ICD-10-CM | POA: Insufficient documentation

## 2013-10-18 DIAGNOSIS — Z9889 Other specified postprocedural states: Secondary | ICD-10-CM

## 2013-10-18 DIAGNOSIS — C50911 Malignant neoplasm of unspecified site of right female breast: Secondary | ICD-10-CM

## 2013-10-18 DIAGNOSIS — IMO0001 Reserved for inherently not codable concepts without codable children: Secondary | ICD-10-CM

## 2013-10-18 DIAGNOSIS — C50919 Malignant neoplasm of unspecified site of unspecified female breast: Secondary | ICD-10-CM

## 2013-10-18 DIAGNOSIS — Z978 Presence of other specified devices: Secondary | ICD-10-CM

## 2013-10-18 MED ORDER — OXYCODONE HCL 10 MG PO TABS: 10.0000 mg | ORAL_TABLET | Freq: Three times a day (TID) | ORAL | Status: DC | PRN

## 2013-10-18 NOTE — Progress Notes (Deleted)
Subjective:    Patient ID: Karen Hopkins, female    DOB: Jul 10, 1975, 38 y.o.   MRN: [id number]  HPI: Karen Hopkins is a 38 year old female who returns for follow up for chronic pain and medication refill. She says her pain is located in her right arm with a concentration to the right side of thoracic and lumbar region. She rates her pain 8. Her current exercise regime is going to physical therapy twice a week. She has been encouraged to continue the routine she is learning from physical therapist. She verbalized understanding.   She admits she is depressed, she is on Celexa. She will speak to her oncologist about increasing the dose. Emotional support given and time spent encouraging her to become more active and not living a sedentary  life. She verbalized understanding. Pain Inventory Average Pain 8 Pain Right Now 8 My pain is constant, sharp, burning, dull, stabbing, tingling and aching  In the last 24 hours, has pain interfered with the following? General activity 7 Relation with others 7 Enjoyment of life 7 What TIME of day is your pain at its worst? constant all day Sleep (in general) Poor  Pain is worse with: walking, sitting, inactivity and standing Pain improves with: rest, therapy/exercise and medication Relief from Meds: 5  Mobility walk without assistance how many minutes can you walk? 10 ability to climb steps?  yes do you drive?  yes transfers alone  Function I need assistance with the following:  household duties  Neuro/Psych numbness tingling spasms dizziness confusion depression anxiety  Prior Studies Any changes since last visit?  no  Physicians involved in your care Any changes since last visit?  no   Family History  Problem Relation Age of Onset  . Hypertension Mother   . Diabetes type II Father   . Prostate cancer Father    History   Social History  . Marital Status: Single    Spouse Name: N/A    Number of Children: N/A  .  Years of Education: N/A   Social History Main Topics  . Smoking status: Former Smoker -- 0.25 packs/day for 5 years    Quit date: 04/26/2010  . Smokeless tobacco: Never Used  . Alcohol Use: No  . Drug Use: No  . Sexual Activity: Yes   Other Topics Concern  . None   Social History Narrative  . None   Past Surgical History  Procedure Laterality Date  . Supra-umbilical hernia  1443  . Nasal septum surgery    . Latissimus flap to breast Right 03/12/2013    Procedure: RIGHT BREAST LATISSIMUS FLAP WITH EXPANDER PLACEMENT;  Surgeon: Theodoro Kos, DO;  Location: [facility];  Service: Plastics;  Laterality: Right;  . Cesarean section  07/15/2001; 03/18/2004; 11/21/2008  . Tubal ligation  11/21/2008  . Portacath placement Left 09/12/2009  . Modified radical mastectomy Right 03/11/2010  . Port-a-cath removal Left 12/01/2010  . Removal of tissue expander and placement of implant Right 08/23/2013    Procedure: REMOVAL RIGHT TISSUE EXPANDER AND PLACEMENT OF IMPLANT TO RIGHT BREAST ;  Surgeon: Theodoro Kos, DO;  Location: [facility];  Service: Plastics;  Laterality: Right;  . Breast reduction surgery Left 08/23/2013    Procedure: LEFT BREAST REDUCTION  ;  Surgeon: Theodoro Kos, DO;  Location: [facility];  Service: Plastics;  Laterality: Left;  . Liposuction Bilateral 08/23/2013    Procedure: LIPOSUCTION;  Surgeon: Theodoro Kos, DO;  Location: [facility];  Service: Plastics;  Laterality: Bilateral;   Past Medical History  Diagnosis Date  . Seasonal allergies   . Lymphedema of arm     right; no BP or puncture to right arm  . Depression   . Anxiety   . Sinus headache   . Hypertension     under control with med., has been on med. x 2 yr.  . History of breast cancer 2011    right  . History of chemotherapy 2011  . History of radiation therapy 2011   BP 144/77  Pulse 87  Resp 14  Ht 5\' 4"  (1.626 m)  Wt 264 lb (119.75 kg)  BMI 45.29 kg/m2  SpO2 100%   LMP 10/05/2013  Opioid Risk Score:   Fall Risk Score: Low Fall Risk (0-5 points) (pt educated on fall risk, brochure given to pt previously)    Review of Systems  Musculoskeletal: Positive for back pain.  Neurological: Positive for dizziness and numbness.       Tingling, spasms  Psychiatric/Behavioral: The patient is nervous/anxious.        Depression  All other systems reviewed and are negative.      Objective:   Physical Exam  Nursing note and vitals reviewed. Constitutional: She is oriented to [place], place, and time. She appears well-developed and well-nourished.  Obese  HENT:  Head: Normocephalic and atraumatic.  Neck: Normal range of motion. Neck supple.  Cardiovascular: Normal rate, regular rhythm and normal heart sounds.   Pulmonary/Chest: Effort normal and breath sounds normal.  Musculoskeletal:  Normal Muscle Bulk: Muscle testing Reveals: Upper Extremity: Left arm Full ROM and Muscle Strength. Right Arm Lymphedema Noted. Decreased ROM with abduction and adduction. Decreased Flexion and extension. Flexion 45 degree and extension 60 degrees. Right Acromion tenderness. Right Thoracic paraspinal Tenderness: T- 1- T-4 Lower Extremities: Full ROM, Flexion Produces pain into patella. No swelling noted. Arises from chair with ease. Narrow Based gait.  Neurological: She is alert and oriented to [place], place, and time.  Skin: Skin is warm and dry.  Right Arm Lymphedema Noted, not wearing her pressure sleeve today.  Psychiatric: She has a normal mood and affect.          Assessment & Plan:  1.Myofascial pain syndrome chronic postoperative, as well as post radiation: S/P Breast Reconstruction Surgery x 2. Refilled: Oxycodone 10 mg one tablet three times a day. Continue with Physical Therapy. Encouraged to do exercises opposite days of Physical Therapy. Continue Zanaflex.  30 minutes of face to face patient care time was spent during this visit. All questions were  encouraged and answered.  F/U in 1 month

## 2013-10-19 ENCOUNTER — Ambulatory Visit: Payer: Self-pay | Admitting: Neurology

## 2013-10-19 ENCOUNTER — Telehealth: Payer: Self-pay | Admitting: Neurology

## 2013-10-19 NOTE — Telephone Encounter (Signed)
No show for new patient visit on 10/19/2013 

## 2013-10-19 NOTE — Progress Notes (Signed)
Subjective:    Patient ID: Karen Hopkins, female    DOB: Jul 10, 1975, 38 y.o.   MRN: [id number]  HPI: Karen Hopkins is a 38 year old female who returns for follow up for chronic pain and medication refill. She says her pain is located in her right arm with a concentration to the right side of thoracic and lumbar region. She rates her pain 8. Her current exercise regime is going to physical therapy twice a week. She has been encouraged to continue the routine she is learning from physical therapist. She verbalized understanding.   She admits she is depressed, she is on Celexa. She will speak to her oncologist about increasing the dose. Emotional support given and time spent encouraging her to become more active and not living a sedentary  life. She verbalized understanding. Pain Inventory Average Pain 8 Pain Right Now 8 My pain is constant, sharp, burning, dull, stabbing, tingling and aching  In the last 24 hours, has pain interfered with the following? General activity 7 Relation with others 7 Enjoyment of life 7 What TIME of day is your pain at its worst? constant all day Sleep (in general) Poor  Pain is worse with: walking, sitting, inactivity and standing Pain improves with: rest, therapy/exercise and medication Relief from Meds: 5  Mobility walk without assistance how many minutes can you walk? 10 ability to climb steps?  yes do you drive?  yes transfers alone  Function I need assistance with the following:  household duties  Neuro/Psych numbness tingling spasms dizziness confusion depression anxiety  Prior Studies Any changes since last visit?  no  Physicians involved in your care Any changes since last visit?  no   Family History  Problem Relation Age of Onset  . Hypertension Mother   . Diabetes type II Father   . Prostate cancer Father    History   Social History  . Marital Status: Single    Spouse Name: N/A    Number of Children: N/A  .  Years of Education: N/A   Social History Main Topics  . Smoking status: Former Smoker -- 0.25 packs/day for 5 years    Quit date: 04/26/2010  . Smokeless tobacco: Never Used  . Alcohol Use: No  . Drug Use: No  . Sexual Activity: Yes   Other Topics Concern  . None   Social History Narrative  . None   Past Surgical History  Procedure Laterality Date  . Supra-umbilical hernia  1443  . Nasal septum surgery    . Latissimus flap to breast Right 03/12/2013    Procedure: RIGHT BREAST LATISSIMUS FLAP WITH EXPANDER PLACEMENT;  Surgeon: Theodoro Kos, DO;  Location: [facility];  Service: Plastics;  Laterality: Right;  . Cesarean section  07/15/2001; 03/18/2004; 11/21/2008  . Tubal ligation  11/21/2008  . Portacath placement Left 09/12/2009  . Modified radical mastectomy Right 03/11/2010  . Port-a-cath removal Left 12/01/2010  . Removal of tissue expander and placement of implant Right 08/23/2013    Procedure: REMOVAL RIGHT TISSUE EXPANDER AND PLACEMENT OF IMPLANT TO RIGHT BREAST ;  Surgeon: Theodoro Kos, DO;  Location: [facility];  Service: Plastics;  Laterality: Right;  . Breast reduction surgery Left 08/23/2013    Procedure: LEFT BREAST REDUCTION  ;  Surgeon: Theodoro Kos, DO;  Location: [facility];  Service: Plastics;  Laterality: Left;  . Liposuction Bilateral 08/23/2013    Procedure: LIPOSUCTION;  Surgeon: Theodoro Kos, DO;  Location: [facility];  Service: Plastics;  Laterality: Bilateral;   Past Medical History  Diagnosis Date  . Seasonal allergies   . Lymphedema of arm     right; no BP or puncture to right arm  . Depression   . Anxiety   . Sinus headache   . Hypertension     under control with med., has been on med. x 2 yr.  . History of breast cancer 2011    right  . History of chemotherapy 2011  . History of radiation therapy 2011   BP 144/77  Pulse 87  Resp 14  Ht 5\' 4"  (1.626 m)  Wt 264 lb (119.75 kg)  BMI 45.29 kg/m2  SpO2 100%   LMP 10/05/2013  Opioid Risk Score:   Fall Risk Score: Low Fall Risk (0-5 points) (pt educated on fall risk, brochure given to pt previously)    Review of Systems  Musculoskeletal: Positive for back pain.  Neurological: Positive for dizziness and numbness.       Tingling, spasms  Psychiatric/Behavioral: The patient is nervous/anxious.        Depression  All other systems reviewed and are negative.      Objective:   Physical Exam  Nursing note and vitals reviewed. Constitutional: She is oriented to [place], place, and time. She appears well-developed and well-nourished.  Obese  HENT:  Head: Normocephalic and atraumatic.  Neck: Normal range of motion. Neck supple.  Cardiovascular: Normal rate, regular rhythm and normal heart sounds.   Pulmonary/Chest: Effort normal and breath sounds normal.  Musculoskeletal:  Normal Muscle Bulk: Muscle testing Reveals: Upper Extremity: Left arm Full ROM and Muscle Strength. Right Arm Lymphedema Noted. Decreased ROM with abduction and adduction. Decreased Flexion and extension. Flexion 45 degree and extension 60 degrees. Right Acromion tenderness. Right Thoracic paraspinal Tenderness: T- 1- T-4 Lower Extremities: Full ROM, Flexion Produces pain into patella. No swelling noted. Arises from chair with ease. Narrow Based gait.  Neurological: She is alert and oriented to [place], place, and time.  Skin: Skin is warm and dry.  Right Arm Lymphedema Noted, not wearing her pressure sleeve today.  Psychiatric: She has a normal mood and affect.          Assessment & Plan:  1.Myofascial pain syndrome chronic postoperative, as well as post radiation: S/P Breast Reconstruction Surgery x 2. Refilled: Oxycodone 10 mg one tablet three times a day. Continue with Physical Therapy. Encouraged to do exercises opposite days of Physical Therapy. Continue Zanaflex.  30 minutes of face to face patient care time was spent during this visit. All questions were  encouraged and answered.  F/U in 1 month

## 2013-10-23 ENCOUNTER — Ambulatory Visit: Payer: Medicare Other | Admitting: Physical Therapy

## 2013-10-25 ENCOUNTER — Ambulatory Visit: Payer: Medicare Other | Admitting: Physical Therapy

## 2013-10-25 ENCOUNTER — Ambulatory Visit: Payer: Medicare Other

## 2013-10-25 ENCOUNTER — Other Ambulatory Visit: Payer: Self-pay | Admitting: Oncology

## 2013-10-25 DIAGNOSIS — C50919 Malignant neoplasm of unspecified site of unspecified female breast: Secondary | ICD-10-CM

## 2013-10-30 ENCOUNTER — Ambulatory Visit: Payer: Medicare Other

## 2013-10-30 DIAGNOSIS — Z9889 Other specified postprocedural states: Secondary | ICD-10-CM | POA: Insufficient documentation

## 2013-11-01 ENCOUNTER — Ambulatory Visit: Payer: Medicare Other | Admitting: Physical Therapy

## 2013-11-06 ENCOUNTER — Ambulatory Visit: Payer: Medicare Other | Attending: Physical Medicine & Rehabilitation | Admitting: Physical Therapy

## 2013-11-06 DIAGNOSIS — M24519 Contracture, unspecified shoulder: Secondary | ICD-10-CM | POA: Insufficient documentation

## 2013-11-06 DIAGNOSIS — M25519 Pain in unspecified shoulder: Secondary | ICD-10-CM | POA: Insufficient documentation

## 2013-11-06 DIAGNOSIS — IMO0001 Reserved for inherently not codable concepts without codable children: Secondary | ICD-10-CM | POA: Insufficient documentation

## 2013-11-08 ENCOUNTER — Ambulatory Visit: Payer: Medicare Other

## 2013-11-12 ENCOUNTER — Ambulatory Visit: Payer: Medicare Other

## 2013-11-12 DIAGNOSIS — IMO0001 Reserved for inherently not codable concepts without codable children: Secondary | ICD-10-CM | POA: Diagnosis not present

## 2013-11-14 ENCOUNTER — Ambulatory Visit: Payer: Medicare Other | Admitting: Physical Therapy

## 2013-11-14 DIAGNOSIS — IMO0001 Reserved for inherently not codable concepts without codable children: Secondary | ICD-10-CM | POA: Diagnosis not present

## 2013-11-19 ENCOUNTER — Ambulatory Visit: Payer: Medicare Other | Admitting: Physical Therapy

## 2013-11-19 ENCOUNTER — Other Ambulatory Visit (HOSPITAL_BASED_OUTPATIENT_CLINIC_OR_DEPARTMENT_OTHER): Payer: Medicare Other

## 2013-11-19 DIAGNOSIS — C50911 Malignant neoplasm of unspecified site of right female breast: Secondary | ICD-10-CM

## 2013-11-19 DIAGNOSIS — E876 Hypokalemia: Secondary | ICD-10-CM

## 2013-11-19 DIAGNOSIS — IMO0001 Reserved for inherently not codable concepts without codable children: Secondary | ICD-10-CM | POA: Diagnosis not present

## 2013-11-19 DIAGNOSIS — C50419 Malignant neoplasm of upper-outer quadrant of unspecified female breast: Secondary | ICD-10-CM

## 2013-11-19 LAB — CBC WITH DIFFERENTIAL/PLATELET
BASO%: 0.7 % (ref 0.0–2.0)
BASOS ABS: 0.1 10*3/uL (ref 0.0–0.1)
EOS%: 2.8 % (ref 0.0–7.0)
Eosinophils Absolute: 0.2 10*3/uL (ref 0.0–0.5)
HCT: 37.9 % (ref 34.8–46.6)
HGB: 12.3 g/dL (ref 11.6–15.9)
LYMPH%: 30.5 % (ref 14.0–49.7)
MCH: 27.3 pg (ref 25.1–34.0)
MCHC: 32.4 g/dL (ref 31.5–36.0)
MCV: 84.3 fL (ref 79.5–101.0)
MONO#: 0.4 10*3/uL (ref 0.1–0.9)
MONO%: 5 % (ref 0.0–14.0)
NEUT#: 5.4 10*3/uL (ref 1.5–6.5)
NEUT%: 61 % (ref 38.4–76.8)
Platelets: 274 10*3/uL (ref 145–400)
RBC: 4.5 10*6/uL (ref 3.70–5.45)
RDW: 14.7 % — AB (ref 11.2–14.5)
WBC: 8.9 10*3/uL (ref 3.9–10.3)
lymph#: 2.7 10*3/uL (ref 0.9–3.3)

## 2013-11-19 LAB — COMPREHENSIVE METABOLIC PANEL (CC13)
ALBUMIN: 3.2 g/dL — AB (ref 3.5–5.0)
ALK PHOS: 67 U/L (ref 40–150)
ALT: 11 U/L (ref 0–55)
AST: 11 U/L (ref 5–34)
Anion Gap: 8 mEq/L (ref 3–11)
BUN: 9.6 mg/dL (ref 7.0–26.0)
CO2: 26 mEq/L (ref 22–29)
Calcium: 9.3 mg/dL (ref 8.4–10.4)
Chloride: 105 mEq/L (ref 98–109)
Creatinine: 0.8 mg/dL (ref 0.6–1.1)
Glucose: 113 mg/dl (ref 70–140)
POTASSIUM: 3.3 meq/L — AB (ref 3.5–5.1)
Sodium: 139 mEq/L (ref 136–145)
Total Bilirubin: 0.25 mg/dL (ref 0.20–1.20)
Total Protein: 7.4 g/dL (ref 6.4–8.3)

## 2013-11-21 ENCOUNTER — Ambulatory Visit: Payer: Medicare Other | Admitting: Physical Therapy

## 2013-11-22 ENCOUNTER — Other Ambulatory Visit: Payer: Self-pay

## 2013-11-22 MED ORDER — TIZANIDINE HCL 4 MG PO TABS: 4.0000 mg | ORAL_TABLET | Freq: Three times a day (TID) | ORAL | Status: DC

## 2013-11-23 ENCOUNTER — Encounter: Payer: Self-pay | Admitting: Registered Nurse

## 2013-11-23 ENCOUNTER — Encounter: Payer: Medicare Other | Attending: Physical Medicine & Rehabilitation | Admitting: Registered Nurse

## 2013-11-23 VITALS — BP 114/64 | HR 89 | Resp 14 | Ht 64.0 in

## 2013-11-23 DIAGNOSIS — G893 Neoplasm related pain (acute) (chronic): Secondary | ICD-10-CM

## 2013-11-23 DIAGNOSIS — Z853 Personal history of malignant neoplasm of breast: Secondary | ICD-10-CM | POA: Diagnosis not present

## 2013-11-23 DIAGNOSIS — M7918 Myalgia, other site: Secondary | ICD-10-CM

## 2013-11-23 DIAGNOSIS — IMO0001 Reserved for inherently not codable concepts without codable children: Secondary | ICD-10-CM

## 2013-11-23 DIAGNOSIS — Z9889 Other specified postprocedural states: Secondary | ICD-10-CM

## 2013-11-23 DIAGNOSIS — Z5181 Encounter for therapeutic drug level monitoring: Secondary | ICD-10-CM

## 2013-11-23 DIAGNOSIS — Z9010 Acquired absence of unspecified breast and nipple: Secondary | ICD-10-CM | POA: Diagnosis not present

## 2013-11-23 DIAGNOSIS — M25519 Pain in unspecified shoulder: Secondary | ICD-10-CM | POA: Diagnosis present

## 2013-11-23 DIAGNOSIS — Z79899 Other long term (current) drug therapy: Secondary | ICD-10-CM

## 2013-11-23 DIAGNOSIS — Z978 Presence of other specified devices: Secondary | ICD-10-CM

## 2013-11-23 DIAGNOSIS — C50911 Malignant neoplasm of unspecified site of right female breast: Secondary | ICD-10-CM

## 2013-11-23 DIAGNOSIS — C50919 Malignant neoplasm of unspecified site of unspecified female breast: Secondary | ICD-10-CM

## 2013-11-23 MED ORDER — OXYCODONE HCL 10 MG PO TABS: 10.0000 mg | ORAL_TABLET | Freq: Three times a day (TID) | ORAL | Status: DC | PRN

## 2013-11-23 NOTE — Patient Instructions (Addendum)
Don't Forget to talk to the [facility] about increasing your Celexa,  Call the [employer] to see if there are any Breast cancer Survivor Groups Available here in [place].  Increase Your Activity/ Walk to mailbox three times a week  Continue with [place] once a week walk the pool for 15 minutes and increase your endurance weekly  Do your Right Arm Exercises daily!!!!!!!!!!!!!!!

## 2013-11-23 NOTE — Progress Notes (Signed)
Subjective:    Patient ID: Karen Hopkins, female    DOB: [birth year], 38 y.o.   MRN: [id number]  HPI: [name] is a 38 year old female who returns for follow up for chronic pain and medication refill She says her pain is located in her right arm, and right thoracic and lumbar mainly the right side. She rates her pain 8. Her current exercise regime is attending Physical Therapy twice a week. She went to the [facility] she states she was very exhausted afterwards. She will restart the program at a slower pace.  She admits to being depressed she is on Celexa, the Outpatient  Cancer Rehabilitation prescribed the medication. She has an appointment on 11/26/13 she will speak with them. She was also given a pamphlet to [facility]. Spent a lot of time talking about increasing her activity and getting out of the house. Also dicussed the importance of counseling. She was instructed to call [facility] to see if they have any support groups available in [place]. She verbalizes Understanding. Pain Inventory Average Pain 8 Pain Right Now 8 My pain is sharp, burning, dull, stabbing and aching  In the last 24 hours, has pain interfered with the following? General activity 7 Relation with others 7 Enjoyment of life 7 What TIME of day is your pain at its worst? morning, daytime, night Sleep (in general) Poor  Pain is worse with: bending, sitting and some activites Pain improves with: rest, therapy/exercise and medication Relief from Meds: 8  Mobility walk without assistance how many minutes can you walk? 10 ability to climb steps?  yes do you drive?  yes transfers alone Do you have any goals in this area?  yes  Function disabled: date disabled na I need assistance with the following:  household duties  Neuro/Psych numbness tingling spasms confusion depression anxiety  Prior Studies Any changes since last visit?  no  Physicians involved in your  care Any changes since last visit?  no   Family History  Problem Relation Age of Onset  . Hypertension Mother   . Diabetes type II Father   . Prostate cancer Father    History   Social History  . Marital Status: Single    Spouse Name: N/A    Number of Children: N/A  . Years of Education: N/A   Social History Main Topics  . Smoking status: Former Smoker -- 0.25 packs/day for 5 years    Quit date: 04/26/2010  . Smokeless tobacco: Never Used  . Alcohol Use: No  . Drug Use: No  . Sexual Activity: Yes   Other Topics Concern  . None   Social History Narrative  . None   Past Surgical History  Procedure Laterality Date  . Supra-umbilical hernia  6578  . Nasal septum surgery    . Latissimus flap to breast Right 03/12/2013    Procedure: RIGHT BREAST LATISSIMUS FLAP WITH EXPANDER PLACEMENT;  Surgeon: Theodoro Kos, DO;  Location: [facility];  Service: Plastics;  Laterality: Right;  . Cesarean section  07/15/2001; 03/18/2004; 11/21/2008  . Tubal ligation  11/21/2008  . Portacath placement Left 09/12/2009  . Modified radical mastectomy Right 03/11/2010  . Port-a-cath removal Left 12/01/2010  . Removal of tissue expander and placement of implant Right 08/23/2013    Procedure: REMOVAL RIGHT TISSUE EXPANDER AND PLACEMENT OF IMPLANT TO RIGHT BREAST ;  Surgeon: Theodoro Kos, DO;  Location: [facility];  Service: Plastics;  Laterality:  Right;  . Breast reduction surgery Left 08/23/2013    Procedure: LEFT BREAST REDUCTION  ;  Surgeon: Theodoro Kos, DO;  Location: [facility];  Service: Plastics;  Laterality: Left;  . Liposuction Bilateral 08/23/2013    Procedure: LIPOSUCTION;  Surgeon: Theodoro Kos, DO;  Location: [facility];  Service: Plastics;  Laterality: Bilateral;   Past Medical History  Diagnosis Date  . Seasonal allergies   . Lymphedema of arm     right; no BP or puncture to right arm  . Depression   . Anxiety   . Sinus headache   .  Hypertension     under control with med., has been on med. x 2 yr.  . History of breast cancer 2011    right  . History of chemotherapy 2011  . History of radiation therapy 2011   BP 114/64  Pulse 89  Resp 14  Ht 5\' 4"  (1.626 m)  SpO2 98%  Opioid Risk Score:   Fall Risk Score: Low Fall Risk (0-5 points) (pt educated on fall risk, brochure given to pt previously)    Review of Systems  Constitutional: Positive for diaphoresis and unexpected weight change.  Gastrointestinal: Positive for constipation.  Neurological: Positive for numbness.       Tingling, spasms  Psychiatric/Behavioral: Positive for confusion. The patient is nervous/anxious.        Depression  All other systems reviewed and are negative.      Objective:   Physical Exam  Nursing note and vitals reviewed. Constitutional: She is oriented to [place], place, and time. She appears well-developed and well-nourished.  HENT:  Head: Normocephalic and atraumatic.  Neck: Normal range of motion. Neck supple.  Cardiovascular: Normal rate, regular rhythm and normal heart sounds.   Pulmonary/Chest: Effort normal and breath sounds normal.  Musculoskeletal:  Normal Muscle Bulk and Muscle Testing Reveals: Upper Extremities: Left Arm Full ROM and Muscle Strength 5/5. Left Arm Decreased ROM 90 degrees, Muscle Strength 4/5. Deltoid Muscle Tenderness Lower Extremities: Full ROM and Muscle Strength 5/5 Flexion Produces Pain into Patella, No swelling noted. Arises from chair with ease  Narrow Based Gait  Neurological: She is alert and oriented to [place], place, and time.  Skin: Skin is warm and dry.  Psychiatric: She has a normal mood and affect.          Assessment & Plan:  1.Myofascial pain syndrome chronic postoperative, as well as post radiation: S/P Breast Reconstruction Surgery x 2.  Refilled: Oxycodone 10 mg one tablet three times a day#90 Continue with Physical Therapy. Encouraged to do exercises opposite days of  Physical Therapy. Continue Zanaflex.   30 minutes of face to face patient care time was spent during this visit. All questions were encouraged and answered.   F/U in 1 month

## 2013-11-26 ENCOUNTER — Ambulatory Visit (HOSPITAL_BASED_OUTPATIENT_CLINIC_OR_DEPARTMENT_OTHER): Payer: Medicare Other | Admitting: Physician Assistant

## 2013-11-26 ENCOUNTER — Encounter: Payer: Self-pay | Admitting: Physician Assistant

## 2013-11-26 ENCOUNTER — Ambulatory Visit: Payer: Medicare Other

## 2013-11-26 ENCOUNTER — Ambulatory Visit: Payer: Medicare Other | Admitting: Registered Nurse

## 2013-11-26 ENCOUNTER — Telehealth: Payer: Self-pay | Admitting: Physician Assistant

## 2013-11-26 ENCOUNTER — Telehealth: Payer: Self-pay | Admitting: *Deleted

## 2013-11-26 ENCOUNTER — Ambulatory Visit (HOSPITAL_COMMUNITY)
Admission: RE | Admit: 2013-11-26 | Discharge: 2013-11-26 | Disposition: A | Discharge status: Home / Self Care | Payer: Medicare Other | Source: Ambulatory Visit | Attending: Physician Assistant | Admitting: Physician Assistant

## 2013-11-26 VITALS — BP 138/92 | HR 99 | Temp 98.3°F | Resp 20 | Ht 64.0 in | Wt 255.8 lb

## 2013-11-26 DIAGNOSIS — K5900 Constipation, unspecified: Secondary | ICD-10-CM

## 2013-11-26 DIAGNOSIS — Z170 Estrogen receptor positive status [ER+]: Secondary | ICD-10-CM

## 2013-11-26 DIAGNOSIS — J328 Other chronic sinusitis: Secondary | ICD-10-CM

## 2013-11-26 DIAGNOSIS — C50919 Malignant neoplasm of unspecified site of unspecified female breast: Secondary | ICD-10-CM

## 2013-11-26 DIAGNOSIS — C50911 Malignant neoplasm of unspecified site of right female breast: Secondary | ICD-10-CM

## 2013-11-26 DIAGNOSIS — G8929 Other chronic pain: Secondary | ICD-10-CM

## 2013-11-26 DIAGNOSIS — I890 Lymphedema, not elsewhere classified: Secondary | ICD-10-CM

## 2013-11-26 DIAGNOSIS — Z1231 Encounter for screening mammogram for malignant neoplasm of breast: Secondary | ICD-10-CM

## 2013-11-26 DIAGNOSIS — F329 Major depressive disorder, single episode, unspecified: Secondary | ICD-10-CM

## 2013-11-26 DIAGNOSIS — Z9889 Other specified postprocedural states: Secondary | ICD-10-CM

## 2013-11-26 DIAGNOSIS — C773 Secondary and unspecified malignant neoplasm of axilla and upper limb lymph nodes: Secondary | ICD-10-CM

## 2013-11-26 DIAGNOSIS — C50419 Malignant neoplasm of upper-outer quadrant of unspecified female breast: Secondary | ICD-10-CM

## 2013-11-26 DIAGNOSIS — E876 Hypokalemia: Secondary | ICD-10-CM

## 2013-11-26 DIAGNOSIS — F3289 Other specified depressive episodes: Secondary | ICD-10-CM

## 2013-11-26 MED ORDER — POTASSIUM CHLORIDE ER 10 MEQ PO CPCR: 10.0000 meq | ORAL_CAPSULE | Freq: Two times a day (BID) | ORAL | Status: DC

## 2013-11-26 MED ORDER — TAMOXIFEN CITRATE 20 MG PO TABS: 20.0000 mg | ORAL_TABLET | Freq: Every day | ORAL | Status: DC

## 2013-11-26 NOTE — Progress Notes (Signed)
ID: Karen Hopkins   DOB: 08-Sep-1975  MR#: [id number]  WUJ#:[id number]  PCP: Chrisandra Netters, MD GYN: SU: Osborn Coho, MD;  Theodoro Kos, MD OTHER MD: Alysia Penna, MD;  Melrose Nakayama, MD  CHIEF COMPLAINT:  Hx of Right Breast Cancer (tamoxifen)   HISTORY OF PRESENT ILLNESS: The patient noted swelling and redness, as well as some tenderness, over her right breast for some time. She thought this might be related to pregnancy, since she delivered her third child about 8 months ago. As the problem did not improve, she brought it to her primary care physician's attention. He treated her with Bactrim, which she says she could not tolerate because of nausea and vomiting, but which in any case did not resolve the problem, so he set her up for mammography at [facility] 08/26/2009. Dr. Sadie Haber found by exam marked skin thickening and erythema of the right breast extending pretty much throughout the breast. By mammography there were scattered fibroglandular densities, and an asymmetric density in the right upper outer quadrant with suspicious microcalcifications. There were also enlarged right axillary lymph nodes. The left appeared normal. By ultrasound there was an ill defined area of hypoechoic tissue in the right upper outer quadrant that was difficult to measure. There were also abnormal right axillary lymph nodes.  Ultrasound guided biopsy was performed the same day, and showed [phone] a poorly differentiated invasive ductal carcinoma, which was estrogen receptor poor at 3%, progesterone receptor positive at 14% with an elevated proliferation marker at 50%, but importantly with strong amplification of HER-2 by CISH with a ratio of 5.20.  Bilateral breast MRIs were obtained 09/02/2009. This showed an area up to 10.6 cm in the right breast showing confluent mass and non-mass enhancement. There was marked skin thickening involving the entire right breast, and numerous bulky right axillary  lymph nodes were identified. The left breast was unremarkable, and there was no evidence of internal mammary lymph node involvement.  Patient was treated in the neoadjuvant setting with 4 dose dense cycles of doxorubicin and cyclophosphamide, followed by 12 weekly doses of paclitaxel and trastuzumab. Dayonna underwent definitive right modified radical mastectomy in October 2011, with a residual 2.5 mm area of tumor in the breast, grade 3, and one of 21 lymph nodes involved.   Her subsequent history is as detailed below  INTERVAL HISTORY: Karen Hopkins returns alone today for followup of her right breast carcinoma, for which she continues on tamoxifen daily. Interval history is notable for the fact that Karen Hopkins had right breast reconstruction and a left breast reduction under the care of Dr. Migdalia Dk in March 2015. They're actually considering additional surgery to the right breast, and she scheduled to meet with Dr. Migdalia Dk again next month for further discussion. Unfortunately, it also seems as if Karen Hopkins had a seizure while hospitalized following her surgery. She has not yet been evaluated by neurology. She's had no additional seizure activity. She denies any abnormal headaches, dizziness, change in vision, nausea, or emesis. She herself feels like the "shaking" she experienced was secondary to some of the medications. She's had no history of seizures otherwise.  Karlie continues on tamoxifen but continues to have hot flashes. She has night sweats. She often has difficulty sleeping. She feels like the tamoxifen makes her gain weight. She continues to have chronic pain as well as chronic lymphedema in the right upper extremity. She is followed by both the pain clinic and our lymphedema clinic.   Unfortunately, she has also developed constipation  and has not had a bowel movement now been almost one week. She had previously been taking stool softeners along with her oxycodone, but discontinued stool softeners, and this  is likely contributing to the problem. She feels bloated and has mild abdominal discomfort, but no pain.    REVIEW OF SYSTEMS: Karen Hopkins  denies any recent fevers or chills. She has had no skin changes, rashes, abnormal bruising, or abnormal bleeding. She still having menstrual cycles, although they are irregular. Her last menstrual cycle was in May. These tend to be quite heavy and last for 5-7 days. She is seen by gynecologist annually. She has moderate fatigue. She denies any nausea or emesis. She's had no change in urinary habits. She has some chronic sinus congestion, but denies any increased cough, phlegm production, shortness of breath, chest pain, or palpitations. She's had no abnormal headaches, dizziness, or change in vision. She does have some peripheral neuropathy which is stable. She feels forgetful at times. She has both anxiety and depression but denies suicidal ideation. She has not yet been seen at [facility] as we have discussed at previous visits. As noted above, Karen Hopkins also continues to have chronic pain in her shoulder, right breast, right side, and back. She also has some joint pain associated with arthritis.  She's had some postsurgical pain in both breasts since her surgery in March.  A detailed review of systems is otherwise stable and noncontributory.     PAST MEDICAL HISTORY: Past Medical History  Diagnosis Date  . Seasonal allergies   . Lymphedema of arm     right; no BP or puncture to right arm  . Depression   . Anxiety   . Sinus headache   . Hypertension     under control with med., has been on med. x 2 yr.  . History of breast cancer 2011    right  . History of chemotherapy 2011  . History of radiation therapy 2011    PAST SURGICAL HISTORY: Past Surgical History  Procedure Laterality Date  . Supra-umbilical hernia  5573  . Nasal septum surgery    . Latissimus flap to breast Right 03/12/2013    Procedure: RIGHT BREAST LATISSIMUS FLAP WITH EXPANDER PLACEMENT;   Surgeon: Theodoro Kos, DO;  Location: [facility];  Service: Plastics;  Laterality: Right;  . Cesarean section  07/15/2001; 03/18/2004; 11/21/2008  . Tubal ligation  11/21/2008  . Portacath placement Left 09/12/2009  . Modified radical mastectomy Right 03/11/2010  . Port-a-cath removal Left 12/01/2010  . Removal of tissue expander and placement of implant Right 08/23/2013    Procedure: REMOVAL RIGHT TISSUE EXPANDER AND PLACEMENT OF IMPLANT TO RIGHT BREAST ;  Surgeon: Theodoro Kos, DO;  Location: [facility];  Service: Plastics;  Laterality: Right;  . Breast reduction surgery Left 08/23/2013    Procedure: LEFT BREAST REDUCTION  ;  Surgeon: Theodoro Kos, DO;  Location: [facility];  Service: Plastics;  Laterality: Left;  . Liposuction Bilateral 08/23/2013    Procedure: LIPOSUCTION;  Surgeon: Theodoro Kos, DO;  Location: [facility];  Service: Plastics;  Laterality: Bilateral;    FAMILY HISTORY Family History  Problem Relation Age of Onset  . Hypertension Mother   . Diabetes type II Father   . Prostate cancer Father   There is no history of breast or ovarian cancer in the immediate family, but of the patient's mother's mother's six sisters, two (the patient's great-aunts) had ovarian cancer.  GYNECOLOGIC HISTORY:   (Updated  11/26/2013) The patient is GX, P3. First child was premature. Age at first delivery, 38 years old.   she status post tubal ligation. She continues to have menstrual cycles, although irregularly, with last menstrual cycle in May 2015.   SOCIAL HISTORY: (Updated 11/26/2013) She is currently disabled. Kaylin's children are [name], and [name]. They are all boys, all at home. The patient attends a non-denominational church in [place], which she considers her home, but she is currently living in [place].   ADVANCED DIRECTIVES:  HEALTH MAINTENANCE:  (Updated 11/26/2013) History  Substance Use Topics  . Smoking status: Former  Smoker -- 0.25 packs/day for 5 years    Quit date: 04/26/2010  . Smokeless tobacco: Never Used  . Alcohol Use: No    Colonoscopy: Never  PAP: April 2015  Bone density: Never  Lipid panel: August 2014    Allergies  Allergen Reactions  . Lyrica [Pregabalin] Nausea Only    .  Marland Kitchen Meloxicam Nausea Only  . Penicillins Swelling    FACIAL SWELLING  . Robaxin [Methocarbamol] Nausea Only    Current Outpatient Prescriptions  Medication Sig Dispense Refill  . ALPRAZOLAM XR 1 MG 24 hr tablet TAKE ONE (1) TABLET BY MOUTH EVERY DAY  30 tablet  1  . citalopram (CELEXA) 20 MG tablet TAKE ONE (1) TABLET BY MOUTH EVERY DAY  30 tablet  3  . diclofenac sodium (VOLTAREN) 1 % GEL Apply 2 g topically 4 (four) times daily.  2 Tube  2  . naproxen (NAPROSYN) 500 MG tablet Take 500 mg by mouth 3 (three) times daily as needed. headache      . Oxycodone HCl 10 MG TABS Take 1 tablet (10 mg total) by mouth every 8 (eight) hours as needed.  90 tablet  0  . tamoxifen (NOLVADEX) 20 MG tablet Take 1 tablet (20 mg total) by mouth daily.  90 tablet  3  . tiZANidine (ZANAFLEX) 4 MG tablet Take 1 tablet (4 mg total) by mouth 3 (three) times daily.  90 tablet  2  . triamterene-hydrochlorothiazide (MAXZIDE-25) 37.5-25 MG per tablet TAKE ONE (1) TABLET BY MOUTH EVERY DAY  30 tablet  2  . metFORMIN (GLUCOPHAGE) 500 MG tablet Take 1 tablet (500 mg total) by mouth daily with breakfast.  30 tablet  3  . potassium chloride (MICRO-K) 10 MEQ CR capsule Take 1 capsule (10 mEq total) by mouth 2 (two) times daily.  180 capsule  3   No current facility-administered medications for this visit.    OBJECTIVE: Young [place] American woman who appears tired but is in no acute distress Filed Vitals:   11/26/13 1156  BP: 138/92  Pulse: 99  Temp: 98.3 F (36.8 C)  Resp: 20     Body mass index is 43.89 kg/(m^2).    ECOG FS: 2   Filed Weights   11/26/13 1156  Weight: 255 lb 12.8 oz (116.03 kg)   Physical Exam: HEENT:  Sclerae  anicteric.  Oropharynx clear. Buccal mucosa is pink and moist. Neck supple, trachea midline.  Nodes:  No cervical or supraclavicular lymphadenopathy palpated.  Breast Exam: Patient status post right mastectomy with recent reconstruction.  Incisions are healing well. There is  no evidence of infection. No palpable nodularity or skin changes noted, and no evidence of local recurrence. Left breast is status post reduction mammoplasty, with well-healed incisions. Breast is slightly tender to palpation. Axillae are benign bilaterally for palpable lymphadenopathy. Lungs:  Clear to auscultation bilaterally.  No crackles wheezes  or rhonchi. Heart:  Regular rate and rhythm, no murmur appreciated.  Abdomen:  Soft,  Obese, nontender.  Positive  but hypoactive bowel sounds.  Musculoskeletal: Diffuse spinal tenderness to  palpation. Limited range of motion in the right upper extremity secondary to pain and swelling.  Extremities:  Lymphedema noted in the right upper extremity. No additional peripheral edema noted Neuro:  Nonfocal. Well oriented with anxious affect     LAB RESULTS: Lab Results  Component Value Date   WBC 8.9 11/19/2013   NEUTROABS 5.4 11/19/2013   HGB 12.3 11/19/2013   HCT 37.9 11/19/2013   MCV 84.3 11/19/2013   PLT 274 11/19/2013      Chemistry      Component Value Date/Time   NA 139 11/19/2013 1007   NA 139 08/20/2013 1155   K 3.3* 11/19/2013 1007   K 3.6* 08/20/2013 1155   CL 100 08/20/2013 1155   CL 105 09/27/2012 1044   CO2 26 11/19/2013 1007   CO2 26 08/20/2013 1155   BUN 9.6 11/19/2013 1007   BUN 10 08/20/2013 1155   CREATININE 0.8 11/19/2013 1007   CREATININE 0.67 08/20/2013 1155      Component Value Date/Time   CALCIUM 9.3 11/19/2013 1007   CALCIUM 9.5 08/20/2013 1155   ALKPHOS 67 11/19/2013 1007   ALKPHOS 65 11/24/2011 0903   AST 11 11/19/2013 1007   AST 13 11/24/2011 0903   ALT 11 11/19/2013 1007   ALT 13 11/24/2011 0903   BILITOT 0.25 11/19/2013 1007   BILITOT 0.1* 11/24/2011  0903       STUDIES: Left mammogram on 10/24/2012 was unremarkable.  Patient is slightly past due for her left mammogram this year, delayed secondary to her recent surgery. This is being scheduled for July 2015.   A KUB has been ordered today, 11/26/2013, to assess constipation.  These results are pending.   ASSESSMENT: 38 year old BRCA1 and BRCA2 negative [place] woman, status post right breast biopsy in March 2011 for a high-grade invasive ductal carcinoma, ER positive 3%, PR positive 14%, strongly HER2/neu positive with MIB-1 of 15%. Biopsy proven lymph node involvement at presentation. Changes consistent with inflammatory carcinoma.   1. Treated neoadjuvantly, status post 4 dose-dense cycles of doxorubicin/cyclophosphamide, followed by 12 weekly doses of paclitaxel with trastuzumab completed September 2011.   2. Trastuzumab was then continued for a total of 1 year [to June 2012]. Post-treatment echo showed a well-preserved ejection fraction.   3. Status post right modified radical mastectomy in October 2011 showing a ypT1a ypN1 invasive ductal carcinoma, grade 3.  Status post right reconstruction with implant, March 2015   4. postmastectomy radiation completed in January 2012  5. on tamoxifen beginning January of 2012  6. other problems include chronic postsurgical pain, chronic right upper extremity lymphedema, and chronic depression  PLAN: The majority of our 25 minute appointment today was spent reviewing Odalys's questions and concerns, reviewing both her chronic and acute medical issues, addressing her treatment plan, and coordinating care.  Indira  actually appears to be doing quite well with regards to the breast cancer itself. She will continue on tamoxifen which I have refilled for another year.  We are rescheduling her left mammogram to be obtained within the next few weeks. She will followup with Dr. Migdalia Dk next month as planned. Of course she will continue to be  followed regularly by but the pain clinic and the lymphedema clinic for her chronic issues.   I am also referring her for a KUB  today to evaluate her recent constipation. I think this is likely secondary to her medications, but we will rule out any possibility of structural and before giving her directions regarding laxatives. I have encouraged her in the future to use stool softeners daily while on the narcotic pain medication.  Tayen also agreed to reschedule her appointment with a neurologist for evaluation of a possible seizure following her recent surgery. She also agreed to go to [facility] for further evaluation of her depression and review of her medications. She's been hesitant to make both of these appointments, but agrees to do so this week.  She will also make an appointment for a routine followup with her primary care provider  in July.  Finally, we continue to treat her mild hypokalemia, and I have refilled her potassium supplement, 10 mEq twice daily. We will follow her labs with each visit, the next of which will be in December 2015. All of the above was reviewed with the patient today, and she was also given all of the above instructions in writing. She voices both her understanding and agreement with our plan, and knows to call with any changes or problems.  Anyi Fels PA-C    11/26/2013

## 2013-11-26 NOTE — Telephone Encounter (Signed)
This RN spoke with pt per her recommendation post review of KUB by AB/PA and use of magnesium citrate.  Karen Hopkins verbalized understanding including need to call this office by noon tomorrow if she has not had a bowel movement.

## 2013-11-27 ENCOUNTER — Ambulatory Visit: Payer: Medicare Other

## 2013-11-27 DIAGNOSIS — IMO0001 Reserved for inherently not codable concepts without codable children: Secondary | ICD-10-CM | POA: Diagnosis not present

## 2013-11-28 ENCOUNTER — Ambulatory Visit: Payer: Medicare Other

## 2013-11-28 DIAGNOSIS — IMO0001 Reserved for inherently not codable concepts without codable children: Secondary | ICD-10-CM | POA: Diagnosis not present

## 2013-11-28 NOTE — Telephone Encounter (Signed)
Patient called and left voice mail that still has only been to bathroom 1 time since the medicine. Attempted to call patient back with further recommendations, no answer.

## 2013-12-03 ENCOUNTER — Ambulatory Visit: Payer: Medicare Other | Admitting: Physical Therapy

## 2013-12-03 DIAGNOSIS — IMO0001 Reserved for inherently not codable concepts without codable children: Secondary | ICD-10-CM | POA: Diagnosis not present

## 2013-12-05 ENCOUNTER — Ambulatory Visit: Payer: Medicare Other | Attending: Physical Medicine & Rehabilitation | Admitting: Physical Therapy

## 2013-12-05 DIAGNOSIS — IMO0001 Reserved for inherently not codable concepts without codable children: Secondary | ICD-10-CM | POA: Insufficient documentation

## 2013-12-05 DIAGNOSIS — M25519 Pain in unspecified shoulder: Secondary | ICD-10-CM | POA: Diagnosis not present

## 2013-12-05 DIAGNOSIS — M24519 Contracture, unspecified shoulder: Secondary | ICD-10-CM | POA: Insufficient documentation

## 2013-12-07 ENCOUNTER — Other Ambulatory Visit: Payer: Self-pay | Admitting: Oncology

## 2013-12-07 DIAGNOSIS — I10 Essential (primary) hypertension: Secondary | ICD-10-CM

## 2013-12-10 ENCOUNTER — Ambulatory Visit: Payer: Medicare Other | Admitting: Physical Therapy

## 2013-12-10 DIAGNOSIS — IMO0001 Reserved for inherently not codable concepts without codable children: Secondary | ICD-10-CM | POA: Diagnosis not present

## 2013-12-12 ENCOUNTER — Ambulatory Visit: Payer: Medicare Other | Admitting: Physical Therapy

## 2013-12-12 DIAGNOSIS — IMO0001 Reserved for inherently not codable concepts without codable children: Secondary | ICD-10-CM | POA: Diagnosis not present

## 2013-12-17 ENCOUNTER — Ambulatory Visit: Payer: Medicare Other | Admitting: Physical Therapy

## 2013-12-17 DIAGNOSIS — IMO0001 Reserved for inherently not codable concepts without codable children: Secondary | ICD-10-CM | POA: Diagnosis not present

## 2013-12-18 ENCOUNTER — Other Ambulatory Visit: Payer: Self-pay | Admitting: Oncology

## 2013-12-18 DIAGNOSIS — C50919 Malignant neoplasm of unspecified site of unspecified female breast: Secondary | ICD-10-CM

## 2013-12-20 ENCOUNTER — Ambulatory Visit: Payer: Medicare Other

## 2013-12-20 ENCOUNTER — Encounter: Payer: Medicare Other | Attending: Physical Medicine & Rehabilitation | Admitting: Registered Nurse

## 2013-12-20 ENCOUNTER — Encounter: Payer: Self-pay | Admitting: Registered Nurse

## 2013-12-20 VITALS — HR 101 | Resp 14 | Ht 64.0 in | Wt 252.0 lb

## 2013-12-20 DIAGNOSIS — M25519 Pain in unspecified shoulder: Secondary | ICD-10-CM | POA: Diagnosis present

## 2013-12-20 DIAGNOSIS — Z853 Personal history of malignant neoplasm of breast: Secondary | ICD-10-CM | POA: Insufficient documentation

## 2013-12-20 DIAGNOSIS — C50911 Malignant neoplasm of unspecified site of right female breast: Secondary | ICD-10-CM

## 2013-12-20 DIAGNOSIS — M7918 Myalgia, other site: Secondary | ICD-10-CM

## 2013-12-20 DIAGNOSIS — Z9010 Acquired absence of unspecified breast and nipple: Secondary | ICD-10-CM | POA: Insufficient documentation

## 2013-12-20 DIAGNOSIS — Z79899 Other long term (current) drug therapy: Secondary | ICD-10-CM

## 2013-12-20 DIAGNOSIS — G893 Neoplasm related pain (acute) (chronic): Secondary | ICD-10-CM

## 2013-12-20 DIAGNOSIS — C50919 Malignant neoplasm of unspecified site of unspecified female breast: Secondary | ICD-10-CM | POA: Diagnosis not present

## 2013-12-20 DIAGNOSIS — Z5181 Encounter for therapeutic drug level monitoring: Secondary | ICD-10-CM

## 2013-12-20 DIAGNOSIS — IMO0001 Reserved for inherently not codable concepts without codable children: Secondary | ICD-10-CM

## 2013-12-20 DIAGNOSIS — Z978 Presence of other specified devices: Secondary | ICD-10-CM

## 2013-12-20 DIAGNOSIS — Z9889 Other specified postprocedural states: Secondary | ICD-10-CM

## 2013-12-20 MED ORDER — OXYCODONE HCL 10 MG PO TABS: 10.0000 mg | ORAL_TABLET | Freq: Three times a day (TID) | ORAL | Status: DC | PRN

## 2013-12-20 NOTE — Progress Notes (Signed)
Subjective:    Patient ID: Karen Hopkins, female    DOB: Sep 09, 1975, 38 y.o.   MRN: [id number]  HPI: Karen Hopkins is a 38 year old female who returns for follow up for chronic pain and medication refill. She says she is having generalized pain all over. She rates her pain 7. Her current exercise regime is walking,and she goes to physical therapy twice a week. She has become more active in the last few months. Her affect is brighter.  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 Last night she ate something that didn't agree with her, has been having nausea and vomiting.  She didn't bring in her medication bottles, she was educated on the narcotic policy. She verbalizes understanding. She also knows she will have to go to her home to get her bottle if this happens again. She verbalizes understanding. She was having constipation she says she has started taking a stool softner, encourage to drinky plenty of water and have her vegetables and roughage. She verbalized understanding. She has lost 12 ibs since May.  Pain Inventory Average Pain 7 Pain Right Now 7 My pain is sharp, burning, dull, stabbing and aching  In the last 24 hours, has pain interfered with the following? General activity 7 Relation with others 7 Enjoyment of life 7 What TIME of day is your pain at its worst? all the time Sleep (in general) Poor  Pain is worse with: walking, bending, inactivity and some activites Pain improves with: rest, therapy/exercise, pacing activities and medication Relief from Meds: 5  Mobility how many minutes can you walk? 10 ability to climb steps?  yes do you drive?  yes Do you have any goals in this area?  yes  Function disabled: date disabled .  Neuro/Psych weakness numbness spasms confusion depression anxiety loss of taste or smell  Prior Studies Any changes since last visit?  no  Physicians involved in your care Any changes since last visit?  no   Family  History  Problem Relation Age of Onset  . Hypertension Mother   . Diabetes type II Father   . Prostate cancer Father    History   Social History  . Marital Status: Single    Spouse Name: N/A    Number of Children: N/A  . Years of Education: N/A   Social History Main Topics  . Smoking status: Former Smoker -- 0.25 packs/day for 5 years    Quit date: 04/26/2010  . Smokeless tobacco: Never Used  . Alcohol Use: No  . Drug Use: No  . Sexual Activity: Not Currently    Birth Control/ Protection: Surgical   Other Topics Concern  . None   Social History Narrative  . None   Past Surgical History  Procedure Laterality Date  . Supra-umbilical hernia  4098  . Nasal septum surgery    . Latissimus flap to breast Right 03/12/2013    Procedure: RIGHT BREAST LATISSIMUS FLAP WITH EXPANDER PLACEMENT;  Surgeon: Theodoro Kos, DO;  Location: [facility];  Service: Plastics;  Laterality: Right;  . Cesarean section  07/15/2001; 03/18/2004; 11/21/2008  . Tubal ligation  11/21/2008  . Portacath placement Left 09/12/2009  . Modified radical mastectomy Right 03/11/2010  . Port-a-cath removal Left 12/01/2010  . Removal of tissue expander and placement of implant Right 08/23/2013    Procedure: REMOVAL RIGHT TISSUE EXPANDER AND PLACEMENT OF IMPLANT TO RIGHT BREAST ;  Surgeon: Theodoro Kos, DO;  Location: [facility];  Service: Clinical cytogeneticist;  Laterality: Right;  . Breast reduction surgery Left 08/23/2013    Procedure: LEFT BREAST REDUCTION  ;  Surgeon: Theodoro Kos, DO;  Location: [facility];  Service: Plastics;  Laterality: Left;  . Liposuction Bilateral 08/23/2013    Procedure: LIPOSUCTION;  Surgeon: Theodoro Kos, DO;  Location: [facility];  Service: Plastics;  Laterality: Bilateral;   Past Medical History  Diagnosis Date  . Seasonal allergies   . Lymphedema of arm     right; no BP or puncture to right arm  . Depression   . Anxiety   . Sinus headache   . Hypertension      under control with med., has been on med. x 2 yr.  . History of breast cancer 2011    right  . History of chemotherapy 2011  . History of radiation therapy 2011   BP   Pulse 101  Resp 14  Ht 5\' 4"  (1.626 m)  Wt 252 lb (114.306 kg)  BMI 43.23 kg/m2  SpO2 99%  LMP 10/26/2013  Opioid Risk Score:   Fall Risk Score: Low Fall Risk (0-5 points) (patient educated handout declined)   Review of Systems  Constitutional: Positive for unexpected weight change.  Gastrointestinal: Positive for vomiting.  Neurological: Positive for weakness and numbness.  Psychiatric/Behavioral: Positive for confusion and dysphoric mood. The patient is nervous/anxious.   All other systems reviewed and are negative.      Objective:   Physical Exam  Nursing note and vitals reviewed. Constitutional: She is oriented to [place], place, and time. She appears well-developed and well-nourished.  HENT:  Head: Normocephalic and atraumatic.  Neck: Normal range of motion. Neck supple.  Cardiovascular: Normal rate and regular rhythm.   Pulmonary/Chest: Effort normal and breath sounds normal.  Musculoskeletal:  Normal Muscle Bulk and Muscle Testing Reveals: Upper Extremities: Left Full ROM and Muscle Strength 5/5 Right Arm Decreased ROM 90 Degrees Strength 4/5 Lower Extremities: Full ROM and Muscle strength 5/5 Bilateral Flexion produces pain into Hamstrings Arises from chair with ease Narrow Based Gait  Neurological: She is alert and oriented to [place], place, and time.  Skin: Skin is warm and dry.  Psychiatric: She has a normal mood and affect.          Assessment & Plan:  1.Myofascial pain syndrome chronic postoperative, as well as post radiation: S/P Breast Reconstruction Surgery x 2.  Refilled: Oxycodone 10 mg one tablet three times a day#90 Continue with Physical Therapy. Encouraged to do exercises opposite days of Physical Therapy. Continue Zanaflex.  2. Obesity: Continue Healthy Diet and  Exercise Routine.  20 minutes of face to face patient care time was spent during this visit. All questions were encouraged and answered.   F/U in 1 month

## 2013-12-21 ENCOUNTER — Encounter: Payer: Medicare Other | Admitting: Registered Nurse

## 2013-12-24 ENCOUNTER — Ambulatory Visit: Payer: Medicare Other | Admitting: Physical Therapy

## 2013-12-24 ENCOUNTER — Other Ambulatory Visit: Payer: Self-pay | Admitting: Oncology

## 2013-12-24 DIAGNOSIS — IMO0001 Reserved for inherently not codable concepts without codable children: Secondary | ICD-10-CM | POA: Diagnosis not present

## 2013-12-24 DIAGNOSIS — C50919 Malignant neoplasm of unspecified site of unspecified female breast: Secondary | ICD-10-CM

## 2013-12-25 ENCOUNTER — Telehealth: Payer: Self-pay | Admitting: *Deleted

## 2013-12-25 NOTE — Telephone Encounter (Signed)
Karen Hopkins from this medical supply company called about a certificate of medical necessity for a lymphedema compression pump.  They are needing it signed so it can be submitted to insurance.  Is this something you would have ordered?

## 2013-12-25 NOTE — Telephone Encounter (Signed)
I think everything came with Karen's name on it. This would need to be switched to my name or to [place] Since [name] no longer works at this office I cannot sign it. Please have them correct the forearm and refax

## 2013-12-26 ENCOUNTER — Ambulatory Visit: Payer: Medicare Other | Admitting: Physical Therapy

## 2013-12-26 NOTE — Telephone Encounter (Signed)
I spoke with Karen Hopkins and it is being re-sent to Dr Letta Pate.  Received and placed on his cart.

## 2013-12-27 ENCOUNTER — Ambulatory Visit
Admission: RE | Admit: 2013-12-27 | Discharge: 2013-12-27 | Disposition: A | Discharge status: Home / Self Care | Payer: Medicare Other | Source: Ambulatory Visit | Attending: Physician Assistant | Admitting: Physician Assistant

## 2013-12-27 ENCOUNTER — Other Ambulatory Visit: Payer: Self-pay | Admitting: Physician Assistant

## 2013-12-27 DIAGNOSIS — Z1231 Encounter for screening mammogram for malignant neoplasm of breast: Secondary | ICD-10-CM

## 2013-12-31 ENCOUNTER — Ambulatory Visit: Payer: Medicare Other | Admitting: Physical Therapy

## 2013-12-31 DIAGNOSIS — IMO0001 Reserved for inherently not codable concepts without codable children: Secondary | ICD-10-CM | POA: Diagnosis not present

## 2014-01-02 ENCOUNTER — Ambulatory Visit: Payer: Medicare Other | Admitting: Physical Therapy

## 2014-01-07 ENCOUNTER — Ambulatory Visit: Payer: Medicare Other | Admitting: Physical Therapy

## 2014-01-09 ENCOUNTER — Ambulatory Visit: Payer: Medicare Other | Admitting: Physical Therapy

## 2014-01-14 ENCOUNTER — Ambulatory Visit: Payer: Medicare Other | Attending: Physical Medicine & Rehabilitation | Admitting: Physical Therapy

## 2014-01-14 ENCOUNTER — Ambulatory Visit: Payer: Medicare Other | Admitting: Physical Therapy

## 2014-01-14 ENCOUNTER — Telehealth: Payer: Self-pay

## 2014-01-14 DIAGNOSIS — IMO0001 Reserved for inherently not codable concepts without codable children: Secondary | ICD-10-CM | POA: Insufficient documentation

## 2014-01-14 DIAGNOSIS — M24519 Contracture, unspecified shoulder: Secondary | ICD-10-CM | POA: Insufficient documentation

## 2014-01-14 DIAGNOSIS — M25519 Pain in unspecified shoulder: Secondary | ICD-10-CM | POA: Insufficient documentation

## 2014-01-14 MED ORDER — OXYCODONE HCL 10 MG PO TABS: 10.0000 mg | ORAL_TABLET | Freq: Three times a day (TID) | ORAL | Status: DC | PRN

## 2014-01-14 NOTE — Telephone Encounter (Signed)
RX printed for K[place] to sign. Contacted patient to inform her that the RX would be ready for pickup tomorrow afternoon.

## 2014-01-14 NOTE — Telephone Encounter (Signed)
Patient is requesting to pick up her Oxycodone RX this month. She states she will be out of town on 8/17. There is not any appts sooner than 8/17 for her to be seen. Please advise.

## 2014-01-14 NOTE — Telephone Encounter (Signed)
Ok to pick up this month only I will sign Rx in am

## 2014-01-17 ENCOUNTER — Ambulatory Visit: Payer: Medicare Other | Admitting: Physical Therapy

## 2014-01-17 DIAGNOSIS — IMO0001 Reserved for inherently not codable concepts without codable children: Secondary | ICD-10-CM | POA: Diagnosis not present

## 2014-01-21 ENCOUNTER — Ambulatory Visit: Payer: Medicare Other | Admitting: Registered Nurse

## 2014-01-21 ENCOUNTER — Ambulatory Visit: Payer: Medicare Other | Admitting: Physical Therapy

## 2014-01-23 ENCOUNTER — Other Ambulatory Visit: Payer: Self-pay | Admitting: Oncology

## 2014-01-23 ENCOUNTER — Encounter: Payer: Medicare Other | Admitting: Physical Therapy

## 2014-01-23 DIAGNOSIS — C50919 Malignant neoplasm of unspecified site of unspecified female breast: Secondary | ICD-10-CM

## 2014-01-28 ENCOUNTER — Encounter: Payer: Medicare Other | Admitting: Physical Therapy

## 2014-01-30 ENCOUNTER — Encounter: Payer: Medicare Other | Admitting: Physical Therapy

## 2014-02-15 ENCOUNTER — Encounter: Payer: Medicare Other | Attending: Physical Medicine & Rehabilitation | Admitting: Registered Nurse

## 2014-02-15 ENCOUNTER — Encounter: Payer: Self-pay | Admitting: Registered Nurse

## 2014-02-15 VITALS — BP 135/71 | HR 100 | Resp 16 | Ht 64.0 in | Wt 263.0 lb

## 2014-02-15 DIAGNOSIS — M7918 Myalgia, other site: Secondary | ICD-10-CM

## 2014-02-15 DIAGNOSIS — IMO0001 Reserved for inherently not codable concepts without codable children: Secondary | ICD-10-CM

## 2014-02-15 DIAGNOSIS — M25519 Pain in unspecified shoulder: Secondary | ICD-10-CM | POA: Insufficient documentation

## 2014-02-15 DIAGNOSIS — Z853 Personal history of malignant neoplasm of breast: Secondary | ICD-10-CM | POA: Insufficient documentation

## 2014-02-15 DIAGNOSIS — C50911 Malignant neoplasm of unspecified site of right female breast: Secondary | ICD-10-CM

## 2014-02-15 DIAGNOSIS — Z9889 Other specified postprocedural states: Secondary | ICD-10-CM

## 2014-02-15 DIAGNOSIS — G893 Neoplasm related pain (acute) (chronic): Secondary | ICD-10-CM

## 2014-02-15 DIAGNOSIS — Z79899 Other long term (current) drug therapy: Secondary | ICD-10-CM

## 2014-02-15 DIAGNOSIS — Z5181 Encounter for therapeutic drug level monitoring: Secondary | ICD-10-CM

## 2014-02-15 DIAGNOSIS — Z978 Presence of other specified devices: Secondary | ICD-10-CM

## 2014-02-15 DIAGNOSIS — C50919 Malignant neoplasm of unspecified site of unspecified female breast: Secondary | ICD-10-CM

## 2014-02-15 DIAGNOSIS — Z9010 Acquired absence of unspecified breast and nipple: Secondary | ICD-10-CM | POA: Insufficient documentation

## 2014-02-15 MED ORDER — OXYCODONE HCL 10 MG PO TABS: 10.0000 mg | ORAL_TABLET | Freq: Three times a day (TID) | ORAL | Status: DC | PRN

## 2014-02-15 NOTE — Progress Notes (Signed)
Subjective:    Patient ID: Karen Hopkins, female    DOB: 12-Mar-1976, 38 y.o.   MRN: [id number]  HPI: [name] is a 38 year old female who returns for follow up for chronic pain and medication refill. She says her pain is located in her right shoulder. She rates her pain 8. She hasn't been following a current exercise regime at this time. She has been encouraged to increase her activity level. She verbalizes understanding. Also complaining of constipation, she is on a stool softener, has been encouraged to increase her fiber intake and water. She verbalizes understanding.  Pain Inventory Average Pain 8 Pain Right Now 8 My pain is constant, sharp, burning, dull, stabbing, tingling and aching  In the last 24 hours, has pain interfered with the following? General activity 9 Relation with others 9 Enjoyment of life 9 What TIME of day is your pain at its worst? constant all day Sleep (in general) Poor  Pain is worse with: walking, bending, sitting, inactivity and standing Pain improves with: rest and medication Relief from Meds: 5  Mobility walk without assistance how many minutes can you walk? 10 ability to climb steps?  no do you drive?  yes transfers alone  Function Do you have any goals in this area?  no  Neuro/Psych weakness numbness tremor tingling spasms dizziness confusion depression anxiety  Prior Studies Any changes since last visit?  no  Physicians involved in your care Any changes since last visit?  no   Family History  Problem Relation Age of Onset  . Hypertension Mother   . Diabetes type II Father   . Prostate cancer Father    History   Social History  . Marital Status: Single    Spouse Name: N/A    Number of Children: N/A  . Years of Education: N/A   Social History Main Topics  . Smoking status: Former Smoker -- 0.25 packs/day for 5 years    Quit date: 04/26/2010  . Smokeless tobacco: Never Used  . Alcohol Use: No  . Drug  Use: No  . Sexual Activity: Not Currently    Birth Control/ Protection: Surgical   Other Topics Concern  . None   Social History Narrative  . None   Past Surgical History  Procedure Laterality Date  . Supra-umbilical hernia  0932  . Nasal septum surgery    . Latissimus flap to breast Right 03/12/2013    Procedure: RIGHT BREAST LATISSIMUS FLAP WITH EXPANDER PLACEMENT;  Surgeon: Theodoro Kos, DO;  Location: [facility];  Service: Plastics;  Laterality: Right;  . Cesarean section  07/15/2001; 03/18/2004; 11/21/2008  . Tubal ligation  11/21/2008  . Portacath placement Left 09/12/2009  . Modified radical mastectomy Right 03/11/2010  . Port-a-cath removal Left 12/01/2010  . Removal of tissue expander and placement of implant Right 08/23/2013    Procedure: REMOVAL RIGHT TISSUE EXPANDER AND PLACEMENT OF IMPLANT TO RIGHT BREAST ;  Surgeon: Theodoro Kos, DO;  Location: [facility];  Service: Plastics;  Laterality: Right;  . Breast reduction surgery Left 08/23/2013    Procedure: LEFT BREAST REDUCTION  ;  Surgeon: Theodoro Kos, DO;  Location: [facility];  Service: Plastics;  Laterality: Left;  . Liposuction Bilateral 08/23/2013    Procedure: LIPOSUCTION;  Surgeon: Theodoro Kos, DO;  Location: [facility];  Service: Plastics;  Laterality: Bilateral;   Past Medical History  Diagnosis Date  . Seasonal allergies   . Lymphedema of arm  right; no BP or puncture to right arm  . Depression   . Anxiety   . Sinus headache   . Hypertension     under control with med., has been on med. x 2 yr.  . History of breast cancer 2011    right  . History of chemotherapy 2011  . History of radiation therapy 2011   BP 135/71  Pulse 100  Resp 16  Ht 5\' 4"  (1.626 m)  Wt 263 lb (119.296 kg)  BMI 45.12 kg/m2  SpO2 97%  Opioid Risk Score:   Fall Risk Score:      Review of Systems  Constitutional: Positive for unexpected weight change.  Respiratory: Positive for  shortness of breath.   Gastrointestinal: Positive for nausea and constipation.  Neurological: Positive for dizziness, tremors, weakness and numbness.       Tingling,spasms  Psychiatric/Behavioral: Positive for confusion. The patient is nervous/anxious.        Depression  All other systems reviewed and are negative.      Objective:   Physical Exam  Nursing note and vitals reviewed. Constitutional: She is oriented to [place], place, and time. She appears well-developed and well-nourished.  HENT:  Head: Normocephalic and atraumatic.  Neck: Normal range of motion. Neck supple.  Cardiovascular: Normal rate and regular rhythm.   Pulmonary/Chest: Effort normal and breath sounds normal.  Musculoskeletal:  Normal Muscle Bulk and Muscle Testing Reveals: Upper Extremities: Left Full ROM and Muscle Strength 5/5 Right Decreased ROM 90 Degrees Spinal Forward Flexion 45 Degrees and Extension 20 Degrees Right AC Joint Tenderness Right Spine of scapula Tenderness Lumbar Paraspinal Tenderness: L-3- L-5 Arises from chair with ease Narrow Based gait  Neurological: She is alert and oriented to [place], place, and time.  Skin: Skin is warm and dry.  Psychiatric: She has a normal mood and affect.          Assessment & Plan:  1.Myofascial pain syndrome chronic postoperative, as well as post radiation: S/P Breast Reconstruction Surgery x 2.  Refilled: Oxycodone 10 mg one tablet three times a day#90 Continue with Physical Therapy. Encouraged to do exercises opposite days of Physical Therapy. Continue Zanaflex.  2. Obesity: Continue Healthy Diet and Exercise Routine.  3. Constipation: Increase Fiber and water intake. Instructions Given.  20 minutes of face to face patient care time was spent during this visit. All questions were encouraged and answered.  F/U in 1 month

## 2014-02-15 NOTE — Patient Instructions (Addendum)
Constipation  Constipation is when a Fils has fewer than three bowel movements a week, has difficulty having a bowel movement, or has stools that are dry, hard, or larger than normal. As people grow older, constipation is more common. If you try to fix constipation with medicines that make you have a bowel movement (laxatives), the problem may get worse. Long-term laxative use may cause the muscles of the colon to become weak. A low-fiber diet, not taking in enough fluids, and taking certain medicines may make constipation worse.   CAUSES   · Certain medicines, such as antidepressants, pain medicine, iron supplements, antacids, and water pills.    · Certain diseases, such as diabetes, irritable bowel syndrome (IBS), thyroid disease, or depression.    · Not drinking enough water.    · Not eating enough fiber-rich foods.    · Stress or travel.    · Lack of physical activity or exercise.    · Ignoring the urge to have a bowel movement.    · Using laxatives too much.    SIGNS AND SYMPTOMS   · Having fewer than three bowel movements a week.    · Straining to have a bowel movement.    · Having stools that are hard, dry, or larger than normal.    · Feeling full or bloated.    · Pain in the lower abdomen.    · Not feeling relief after having a bowel movement.    DIAGNOSIS   Your health care provider will take a medical history and perform a physical exam. Further testing may be done for severe constipation. Some tests may include:  · A barium enema X-ray to examine your rectum, colon, and, sometimes, your small intestine.    · A sigmoidoscopy to examine your lower colon.    · A colonoscopy to examine your entire colon.  TREATMENT   Treatment will depend on the severity of your constipation and what is causing it. Some dietary treatments include drinking more fluids and eating more fiber-rich foods. Lifestyle treatments may include regular exercise. If these diet and lifestyle recommendations do not help, your health care  provider may recommend taking over-the-counter laxative medicines to help you have bowel movements. Prescription medicines may be prescribed if over-the-counter medicines do not work.   HOME CARE INSTRUCTIONS   · Eat foods that have a lot of fiber, such as fruits, vegetables, whole grains, and beans.  · Limit foods high in fat and processed sugars, such as french fries, hamburgers, cookies, candies, and soda.    · A fiber supplement may be added to your diet if you cannot get enough fiber from foods.    · Drink enough fluids to keep your urine clear or pale yellow.    · Exercise regularly or as directed by your health care provider.    · Go to the restroom when you have the urge to go. Do not hold it.    · Only take over-the-counter or prescription medicines as directed by your health care provider. Do not take other medicines for constipation without talking to your health care provider first.    SEEK IMMEDIATE MEDICAL CARE IF:   · You have bright red blood in your stool.    · Your constipation lasts for more than 4 days or gets worse.    · You have abdominal or rectal pain.    · You have thin, pencil-like stools.    · You have unexplained weight loss.  MAKE SURE YOU:   · Understand these instructions.  · Will watch your condition.  · Will get help right away if you are not   you have with your health care provider. Low-Fiber Diet Fiber is found in fruits, vegetables, and whole grains. A low-fiber diet restricts fibrous foods that are not digested in the small intestine. A diet containing about 10-15 grams of fiber per day is considered low fiber. Low-fiber diets may be used to:  Promote healing and rest the bowel during  intestinal flare-ups.  Prevent blockage of a partially obstructed or narrowed gastrointestinal tract.  Reduce fecal weight and volume.  Slow the movement of feces. You may be on a low-fiber diet as a transitional diet following surgery, after an injury (trauma), or because of a short (acute) or lifelong (chronic) illness. Your health care provider will determine the length of time you need to stay on this diet.  WHAT DO I NEED TO KNOW ABOUT A LOW-FIBER DIET? Always check the fiber content on the packaging's Nutrition Facts label, especially on foods from the grains list. Ask your dietitian if you have questions about specific foods that are related to your condition, especially if the food is not listed below. In general, a low-fiber food will have less than 2 g of fiber. WHAT FOODS CAN I EAT? Grains All breads and crackers made with white flour. Sweet rolls, doughnuts, waffles, pancakes, [place] toast, bagels. Pretzels, Melba toast, zwieback. Well-cooked cereals, such as cornmeal, farina, or cream cereals. Dry cereals that do not contain whole grains, fruit, or nuts, such as refined corn, wheat, rice, and oat cereals. Potatoes prepared any way without skins, plain pastas and noodles, refined white rice. Use white flour for baking and making sauces. Use allowed list of grains for casseroles, dumplings, and puddings.  Vegetables Strained tomato and vegetable juices. Fresh lettuce, cucumber, spinach. Well-cooked (no skin or pulp) or canned vegetables, such as asparagus, bean sprouts, beets, carrots, green beans, mushrooms, potatoes, pumpkin, spinach, yellow squash, tomato sauce/puree, turnips, yams, and zucchini. Keep servings limited to  cup.  Fruits All fruit juices except prune juice. Cooked or canned fruits without skin and seeds, such as applesauce, apricots, cherries, fruit cocktail, grapefruit, grapes, mandarin oranges, melons, peaches, pears, pineapple, and plums. Fresh fruits without skin,  such as apricots, avocados, bananas, melons, pineapple, nectarines, and peaches. Keep servings limited to  cup or 1 piece.  Meat and Other Protein Sources Ground or well-cooked tender beef, ham, veal, lamb, pork, or poultry. Eggs, plain cheese. Fish, oysters, shrimp, lobster, and other seafood. Liver, organ meats. Smooth nut butters. Dairy All milk products and alternative dairy substitutes, such as soy, rice, almond, and coconut, not containing added whole nuts, seeds, or added fruit. Beverages Decaf coffee, fruit, and vegetable juices or smoothies (small amounts, with no pulp or skins, and with fruits from allowed list), sports drinks, herbal tea. Condiments Ketchup, mustard, vinegar, cream sauce, cheese sauce, cocoa powder. Spices in moderation, such as allspice, basil, bay leaves, celery powder or leaves, cinnamon, cumin powder, curry powder, ginger, mace, marjoram, onion or garlic powder, oregano, paprika, parsley flakes, ground pepper, rosemary, sage, savory, tarragon, thyme, and turmeric. Sweets and Desserts Plain cakes and cookies, pie made with allowed fruit, pudding, custard, cream pie. Gelatin, fruit, ice, sherbet, frozen ice pops. Ice cream, ice milk without nuts. Plain hard candy, honey, jelly, molasses, syrup, sugar, chocolate syrup, gumdrops, marshmallows. Limit overall sugar intake.  Fats and Oil Margarine, butter, cream, mayonnaise, salad oils, plain salad dressings made from allowed foods. Choose healthy fats such as olive oil, canola oil, and omega-3 fatty acids (such as found in salmon or tuna) when possible.  Other Bouillon, broth, or cream soups made from allowed foods. Any strained soup. Casseroles or mixed dishes made with allowed foods. The items listed above may not be a complete list of recommended foods or beverages. Contact your dietitian for more options.  WHAT FOODS ARE NOT RECOMMENDED? Grains All whole wheat and whole grain breads and crackers. Multigrains, rye,  bran seeds, nuts, or coconut. Cereals containing whole grains, multigrains, bran, coconut, nuts, raisins. Cooked or dry oatmeal, steel-cut oats. Coarse wheat cereals, granola. Cereals advertised as high fiber. Potato skins. Whole grain pasta, wild or brown rice. Popcorn. Coconut flour. Bran, buckwheat, corn bread, multigrains, rye, wheat germ.  Vegetables Fresh, cooked or canned vegetables, such as artichokes, asparagus, beet greens, broccoli, Brussels sprouts, cabbage, celery, cauliflower, corn, eggplant, kale, legumes or beans, okra, peas, and tomatoes. Avoid large servings of any vegetables, especially raw vegetables.  Fruits Fresh fruits, such as apples with or without skin, berries, cherries, figs, grapes, grapefruit, guavas, kiwis, mangoes, oranges, papayas, pears, persimmons, pineapple, and pomegranate. Prune juice and juices with pulp, stewed or dried prunes. Dried fruits, dates, raisins. Fruit seeds or skins. Avoid large servings of all fresh fruits. Meats and Other Protein Sources Tough, fibrous meats with gristle. Chunky nut butter. Cheese made with seeds, nuts, or other foods not recommended. Nuts, seeds, legumes (beans, including baked beans), dried peas, beans, lentils.  Dairy Yogurt or cheese that contains nuts, seeds, or added fruit.  Beverages Fruit juices with high pulp, prune juice. Caffeinated coffee and teas.  Condiments Coconut, maple syrup, pickles, olives. Sweets and Desserts Desserts, cookies, or candies that contain nuts or coconut, chunky peanut butter, dried fruits. Jams, preserves with seeds, marmalade. Large amounts of sugar and sweets. Any other dessert made with fruits from the not recommended list.  Other Soups made from vegetables that are not recommended or that contain other foods not recommended.  The items listed above may not be a complete list of foods and beverages to avoid. Contact your dietitian for more information. Document Released: 11/13/2001 Document  Revised: 05/29/2013 Document Reviewed: 04/16/2013 [facility] Patient Information 2015 [place], [place]. This information is not intended to replace advice given to you by your health care provider. Make sure you discuss any questions you have with your health care provider.

## 2014-02-18 ENCOUNTER — Encounter: Payer: Medicare Other | Admitting: Registered Nurse

## 2014-02-18 DIAGNOSIS — Z9010 Acquired absence of unspecified breast and nipple: Secondary | ICD-10-CM | POA: Diagnosis not present

## 2014-02-18 DIAGNOSIS — M25519 Pain in unspecified shoulder: Secondary | ICD-10-CM | POA: Diagnosis present

## 2014-02-18 DIAGNOSIS — Z853 Personal history of malignant neoplasm of breast: Secondary | ICD-10-CM | POA: Diagnosis not present

## 2014-02-20 ENCOUNTER — Other Ambulatory Visit: Payer: Self-pay | Admitting: Oncology

## 2014-02-20 ENCOUNTER — Telehealth: Payer: Self-pay | Admitting: Physical Medicine & Rehabilitation

## 2014-02-20 ENCOUNTER — Other Ambulatory Visit: Payer: Self-pay

## 2014-02-20 MED ORDER — TIZANIDINE HCL 4 MG PO TABS: 4.0000 mg | ORAL_TABLET | Freq: Three times a day (TID) | ORAL | Status: DC

## 2014-02-20 NOTE — Telephone Encounter (Signed)
Refill sent to pharmacy. Patient is aware.

## 2014-02-20 NOTE — Telephone Encounter (Signed)
Inquired from MD if OK to refill-per note 11/26/13 she was to be seen at [facility]. MD approved refill X 2.

## 2014-02-20 NOTE — Telephone Encounter (Signed)
Patient is requesting a refill on Tizanidine. Refill sent to pharmacy. Patient is aware.

## 2014-02-20 NOTE — Telephone Encounter (Signed)
Patient needs a refill on her Zanaflex

## 2014-03-12 ENCOUNTER — Encounter: Payer: Self-pay | Admitting: Physical Medicine & Rehabilitation

## 2014-03-12 ENCOUNTER — Ambulatory Visit (HOSPITAL_BASED_OUTPATIENT_CLINIC_OR_DEPARTMENT_OTHER): Payer: Medicare Other | Admitting: Physical Medicine & Rehabilitation

## 2014-03-12 ENCOUNTER — Encounter: Payer: Medicare Other | Attending: Physical Medicine & Rehabilitation

## 2014-03-12 VITALS — BP 137/81 | HR 86 | Resp 14 | Wt 260.0 lb

## 2014-03-12 DIAGNOSIS — M791 Myalgia: Secondary | ICD-10-CM | POA: Diagnosis not present

## 2014-03-12 DIAGNOSIS — G893 Neoplasm related pain (acute) (chronic): Secondary | ICD-10-CM | POA: Insufficient documentation

## 2014-03-12 DIAGNOSIS — M7918 Myalgia, other site: Secondary | ICD-10-CM

## 2014-03-12 MED ORDER — OXYCODONE-ACETAMINOPHEN 7.5-325 MG PO TABS
1.0000 | ORAL_TABLET | Freq: Three times a day (TID) | ORAL | Status: DC | PRN
Start: 2014-03-12 — End: 2014-04-12

## 2014-03-12 NOTE — Patient Instructions (Addendum)
Trigger point lidocaine to trapezius and levator scapula and infraspinatus muscles

## 2014-03-12 NOTE — Progress Notes (Signed)
Subjective:    Patient ID: Karen Hopkins, female    DOB: 1976/02/12, 38 y.o.   MRN: [id number]  HPI Getting flexitouch for lymphema control Hasn't started using it yet No physical therapy currently Last surgery in March Considering additional revision surgery but nothing scheduled at this point  Independent with all self-care and mobility. Has difficulty donning and doffing shirt but is able to do this with increased time  Pain Inventory Average Pain 7 Pain Right Now 7 My pain is intermittent, sharp, burning, dull, stabbing, tingling and aching  In the last 24 hours, has pain interfered with the following? General activity 8 Relation with others 8 Enjoyment of life 8 What TIME of day is your pain at its worst? night Sleep (in general) Poor  Pain is worse with: bending, standing and some activites Pain improves with: therapy/exercise and medication Relief from Meds: 5  Mobility how many minutes can you walk? 10 ability to climb steps?  yes do you drive?  yes  Function disabled: date disabled .  Neuro/Psych numbness tingling confusion depression anxiety  Prior Studies Any changes since last visit?  no  Physicians involved in your care Any changes since last visit?  no   Family History  Problem Relation Age of Onset  . Hypertension Mother   . Diabetes type II Father   . Prostate cancer Father    History   Social History  . Marital Status: Single    Spouse Name: N/A    Number of Children: N/A  . Years of Education: N/A   Social History Main Topics  . Smoking status: Former Smoker -- 0.25 packs/day for 5 years    Quit date: 04/26/2010  . Smokeless tobacco: Never Used  . Alcohol Use: No  . Drug Use: No  . Sexual Activity: Not Currently    Birth Control/ Protection: Surgical   Other Topics Concern  . None   Social History Narrative  . None   Past Surgical History  Procedure Laterality Date  . Supra-umbilical hernia  4854  . Nasal septum  surgery    . Latissimus flap to breast Right 03/12/2013    Procedure: RIGHT BREAST LATISSIMUS FLAP WITH EXPANDER PLACEMENT;  Surgeon: Theodoro Kos, DO;  Location: [place];  Service: Plastics;  Laterality: Right;  . Cesarean section  07/15/2001; 03/18/2004; 11/21/2008  . Tubal ligation  11/21/2008  . Portacath placement Left 09/12/2009  . Modified radical mastectomy Right 03/11/2010  . Port-a-cath removal Left 12/01/2010  . Removal of tissue expander and placement of implant Right 08/23/2013    Procedure: REMOVAL RIGHT TISSUE EXPANDER AND PLACEMENT OF IMPLANT TO RIGHT BREAST ;  Surgeon: Theodoro Kos, DO;  Location: [place];  Service: Plastics;  Laterality: Right;  . Breast reduction surgery Left 08/23/2013    Procedure: LEFT BREAST REDUCTION  ;  Surgeon: Theodoro Kos, DO;  Location: [place];  Service: Plastics;  Laterality: Left;  . Liposuction Bilateral 08/23/2013    Procedure: LIPOSUCTION;  Surgeon: Theodoro Kos, DO;  Location: [place];  Service: Plastics;  Laterality: Bilateral;   Past Medical History  Diagnosis Date  . Seasonal allergies   . Lymphedema of arm     right; no BP or puncture to right arm  . Depression   . Anxiety   . Sinus headache   . Hypertension     under control with med., has been on med. x 2 yr.  . History of breast cancer 2011  right  . History of chemotherapy 2011  . History of radiation therapy 2011   BP 137/81  Pulse 86  Resp 14  Wt 260 lb (117.935 kg)  SpO2 99%  Opioid Risk Score:   Fall Risk Score: Low Fall Risk (0-5 points) (previoulsy educated and given handout) Review of Systems  Constitutional: Positive for diaphoresis and unexpected weight change.  Musculoskeletal:       Spasms  Neurological: Positive for numbness.       Tingling  Psychiatric/Behavioral: Positive for confusion and dysphoric mood. The patient is nervous/anxious.   All other systems reviewed and are negative.      Objective:     Physical Exam  Tenderness above right breast incision line, scar tissue palpated, radiation skin changes Decreased right shoulder abduction 90 shoulder flexion 90 with pain Able to touch back of head but not top of head secondary to decreased external range of motion  unable to touch middle of back with the right hand. Is able to put right hand on the gluteal area  Tenderness to palpation right upper trap as well as right levator scapula     Assessment & Plan:  1. 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  Informed consent was obtained after describing risk and benefits of the procedure with the patient, this includes bleeding, bruising, infection and medication side effects.  The patient wishes to proceed and has given written consent.  The patient was placed in a Seated position.  The R periscap area was marked and prepped with Betadine.  It was entered with a 25-gauge 1-1/2 inch needle and 1 mL of 1% lidocaine was injected into each of 3 trigger points, after negative draw back for blood.  The patient tolerated the procedure well.  Post procedure instructions were given.

## 2014-03-18 ENCOUNTER — Telehealth: Payer: Self-pay | Admitting: *Deleted

## 2014-03-18 NOTE — Telephone Encounter (Signed)
Karen Hopkins needs new Certificate of Medical Necessity sign by Dr. Beatriz Stallion for patients insurance.  Please complete and Fax back ASAP

## 2014-03-22 ENCOUNTER — Telehealth: Payer: Self-pay | Admitting: Physical Medicine & Rehabilitation

## 2014-03-22 MED ORDER — OXYCODONE HCL 5 MG PO TABS
5.0000 mg | ORAL_TABLET | Freq: Four times a day (QID) | ORAL | Status: DC | PRN
Start: 2014-03-22 — End: 2014-04-16

## 2014-03-22 NOTE — Telephone Encounter (Signed)
She wants to try it even though there is no one her to sign the rx.  It will have to wait until Monday when Dr Letta Pate comes back in the office.  She is in agreement.

## 2014-03-22 NOTE — Telephone Encounter (Signed)
It is unlikely that Tylenol will cause upset stomach. If the patient wants, we can try oxycodone 5 mg 4 times per day instead of the Percocet 7.5 3 times a day

## 2014-03-22 NOTE — Telephone Encounter (Signed)
Medication not agreeing with her; nausea; decreased oxy 7.5 from 10mg 

## 2014-03-22 NOTE — Telephone Encounter (Signed)
Says it is not the oxy but the acetaminophen.

## 2014-03-22 NOTE — Telephone Encounter (Signed)
I didn't take message but called the number and no name on voicemail.  I assume she is with insurance company.

## 2014-03-22 NOTE — Telephone Encounter (Signed)
Who is Juliann Pulse? I will be happy to review and sign.  Will be back in office on Monday

## 2014-04-09 ENCOUNTER — Encounter: Payer: Medicare Other | Admitting: Registered Nurse

## 2014-04-09 ENCOUNTER — Ambulatory Visit (INDEPENDENT_AMBULATORY_CARE_PROVIDER_SITE_OTHER): Payer: Medicare Other | Admitting: *Deleted

## 2014-04-09 ENCOUNTER — Ambulatory Visit: Payer: Medicare Other | Admitting: *Deleted

## 2014-04-09 DIAGNOSIS — Z23 Encounter for immunization: Secondary | ICD-10-CM

## 2014-04-12 ENCOUNTER — Telehealth: Payer: Self-pay | Admitting: *Deleted

## 2014-04-12 ENCOUNTER — Ambulatory Visit (HOSPITAL_BASED_OUTPATIENT_CLINIC_OR_DEPARTMENT_OTHER): Payer: Medicare Other | Admitting: Adult Health

## 2014-04-12 ENCOUNTER — Telehealth: Payer: Self-pay | Admitting: Oncology

## 2014-04-12 ENCOUNTER — Encounter: Payer: Self-pay | Admitting: Adult Health

## 2014-04-12 ENCOUNTER — Other Ambulatory Visit: Payer: Self-pay | Admitting: Physician Assistant

## 2014-04-12 VITALS — BP 131/66 | HR 91 | Temp 98.5°F | Resp 18 | Ht 64.0 in | Wt 271.7 lb

## 2014-04-12 DIAGNOSIS — Z170 Estrogen receptor positive status [ER+]: Secondary | ICD-10-CM

## 2014-04-12 DIAGNOSIS — C773 Secondary and unspecified malignant neoplasm of axilla and upper limb lymph nodes: Secondary | ICD-10-CM

## 2014-04-12 DIAGNOSIS — I890 Lymphedema, not elsewhere classified: Secondary | ICD-10-CM

## 2014-04-12 DIAGNOSIS — C50411 Malignant neoplasm of upper-outer quadrant of right female breast: Secondary | ICD-10-CM

## 2014-04-12 DIAGNOSIS — Z872 Personal history of diseases of the skin and subcutaneous tissue: Secondary | ICD-10-CM

## 2014-04-12 DIAGNOSIS — N6489 Other specified disorders of breast: Secondary | ICD-10-CM

## 2014-04-12 DIAGNOSIS — G8929 Other chronic pain: Secondary | ICD-10-CM

## 2014-04-12 DIAGNOSIS — F329 Major depressive disorder, single episode, unspecified: Secondary | ICD-10-CM

## 2014-04-12 NOTE — Telephone Encounter (Signed)
, °

## 2014-04-12 NOTE — Telephone Encounter (Signed)
Received call from pt this am concerning her left breast. Pt says " my left breast looks like an orange peel and I'm scared my cancer is coming back". Pt is very panicky and wants to be seen today. Pt said she had call twice concerning this earlier but did not get an answer. I encouraged pt that we will try to get her seen today.

## 2014-04-12 NOTE — Progress Notes (Signed)
ID: Karen Hopkins   DOB: Oct 23, 1975  MR#: [id number]  YTK#:[id number]  PCP: Chrisandra Netters, MD GYN: SU: Osborn Coho, MD;  Theodoro Kos, MD OTHER MD: Alysia Penna, MD;  Melrose Nakayama, MD  CHIEF COMPLAINT:  Hx of Right Breast Cancer (tamoxifen)   HISTORY OF PRESENT ILLNESS: The patient noted swelling and redness, as well as some tenderness, over her right breast for some time. She thought this might be related to pregnancy, since she delivered her third child about 8 months ago. As the problem did not improve, she brought it to her primary care physician's attention. He treated her with Bactrim, which she says she could not tolerate because of nausea and vomiting, but which in any case did not resolve the problem, so he set her up for mammography at [facility] 08/26/2009. Dr. Sadie Haber found by exam marked skin thickening and erythema of the right breast extending pretty much throughout the breast. By mammography there were scattered fibroglandular densities, and an asymmetric density in the right upper outer quadrant with suspicious microcalcifications. There were also enlarged right axillary lymph nodes. The left appeared normal. By ultrasound there was an ill defined area of hypoechoic tissue in the right upper outer quadrant that was difficult to measure. There were also abnormal right axillary lymph nodes.  Ultrasound guided biopsy was performed the same day, and showed [phone]) a poorly differentiated invasive ductal carcinoma, which was estrogen receptor poor at 3%, progesterone receptor positive at 14% with an elevated proliferation marker at 50%, but importantly with strong amplification of HER-2 by CISH with a ratio of 5.20.  Bilateral breast MRIs were obtained 09/02/2009. This showed an area up to 10.6 cm in the right breast showing confluent mass and non-mass enhancement. There was marked skin thickening involving the entire right breast, and numerous bulky right axillary  lymph nodes were identified. The left breast was unremarkable, and there was no evidence of internal mammary lymph node involvement.  Patient was treated in the neoadjuvant setting with 4 dose dense cycles of doxorubicin and cyclophosphamide, followed by 12 weekly doses of paclitaxel and trastuzumab. Karen Hopkins underwent definitive right modified radical mastectomy in October 2011, with a residual 2.5 mm area of tumor in the breast, grade 3, and one of 21 lymph nodes involved.   Her subsequent history is as detailed below  INTERVAL HISTORY: Darin is here today alone for an urgent visit regarding some skin dimpling that developed in her left breast.  We reviewed her h/o right breast cancer.  Her last mammo was in 10/2012 of her left breast and was normal.  A repeat was not recommended due to the patient's age.  She now has left breast skin dimpling that started last week.  She has had a mammoplasty on that breast.  She has pain everywhere, but this is chronic and she goes to the pain clinic for this.    REVIEW OF SYSTEMS: A 10 point review of systems was conducted and is otherwise negative except for what is noted above.        PAST MEDICAL HISTORY: Past Medical History  Diagnosis Date  . Seasonal allergies   . Lymphedema of arm     right; no BP or puncture to right arm  . Depression   . Anxiety   . Sinus headache   . Hypertension     under control with med., has been on med. x 2 yr.  . History of breast cancer 2011    right  .  History of chemotherapy 2011  . History of radiation therapy 2011    PAST SURGICAL HISTORY: Past Surgical History  Procedure Laterality Date  . Supra-umbilical hernia  3009  . Nasal septum surgery    . Latissimus flap to breast Right 03/12/2013    Procedure: RIGHT BREAST LATISSIMUS FLAP WITH EXPANDER PLACEMENT;  Surgeon: Theodoro Kos, DO;  Location: [facility];  Service: Plastics;  Laterality: Right;  . Cesarean section  07/15/2001; 03/18/2004; 11/21/2008  . Tubal  ligation  11/21/2008  . Portacath placement Left 09/12/2009  . Modified radical mastectomy Right 03/11/2010  . Port-a-cath removal Left 12/01/2010  . Removal of tissue expander and placement of implant Right 08/23/2013    Procedure: REMOVAL RIGHT TISSUE EXPANDER AND PLACEMENT OF IMPLANT TO RIGHT BREAST ;  Surgeon: Theodoro Kos, DO;  Location: [place];  Service: Plastics;  Laterality: Right;  . Breast reduction surgery Left 08/23/2013    Procedure: LEFT BREAST REDUCTION  ;  Surgeon: Theodoro Kos, DO;  Location: [place];  Service: Plastics;  Laterality: Left;  . Liposuction Bilateral 08/23/2013    Procedure: LIPOSUCTION;  Surgeon: Theodoro Kos, DO;  Location: [place];  Service: Plastics;  Laterality: Bilateral;    FAMILY HISTORY Family History  Problem Relation Age of Onset  . Hypertension Mother   . Diabetes type II Father   . Prostate cancer Father   There is no history of breast or ovarian cancer in the immediate family, but of the patient's mother's mother's six sisters, two (the patient's great-aunts) had ovarian cancer.  GYNECOLOGIC HISTORY:   (Updated 11/26/2013) The patient is GX, P3. First child was premature. Age at first delivery, 38 years old.   she status post tubal ligation. She continues to have menstrual cycles, although irregularly, with last menstrual cycle in May 2015.   SOCIAL HISTORY: (Updated 11/26/2013) She is currently disabled. Tnia's children are [name], and [name]. They are all boys, all at home. The patient attends a non-denominational church in [place], which she considers her home, but she is currently living in [place].   ADVANCED DIRECTIVES:  HEALTH MAINTENANCE:  (Updated 11/26/2013) History  Substance Use Topics  . Smoking status: Former Smoker -- 0.25 packs/day for 5 years    Quit date: 04/26/2010  . Smokeless tobacco: Never Used  . Alcohol Use: No    Colonoscopy: Never  PAP: April  2015  Bone density: Never  Lipid panel: August 2014    Allergies  Allergen Reactions  . Lyrica [Pregabalin] Nausea Only    .  Marland Kitchen Meloxicam Nausea Only  . Penicillins Swelling    FACIAL SWELLING  . Robaxin [Methocarbamol] Nausea Only    Current Outpatient Prescriptions  Medication Sig Dispense Refill  . ALPRAZOLAM XR 1 MG 24 hr tablet TAKE ONE (1) TABLET BY MOUTH EVERY DAY 30 tablet 1  . citalopram (CELEXA) 20 MG tablet TAKE ONE (1) TABLET BY MOUTH EVERY DAY 30 tablet 5  . diclofenac sodium (VOLTAREN) 1 % GEL Apply 2 g topically 4 (four) times daily. 2 Tube 2  . naproxen (NAPROSYN) 500 MG tablet Take 500 mg by mouth 3 (three) times daily as needed. headache    . oxyCODONE (OXY IR/ROXICODONE) 5 MG immediate release tablet Take 1 tablet (5 mg total) by mouth every 6 (six) hours as needed for severe pain. 120 tablet 0  . potassium chloride (MICRO-K) 10 MEQ CR capsule Take 1 capsule (10 mEq total) by mouth 2 (two) times daily. [place]  capsule 3  . tamoxifen (NOLVADEX) 20 MG tablet Take 1 tablet (20 mg total) by mouth daily. 90 tablet 3  . tiZANidine (ZANAFLEX) 4 MG tablet Take 1 tablet (4 mg total) by mouth 3 (three) times daily. 90 tablet 2  . triamterene-hydrochlorothiazide (MAXZIDE-25) 37.5-25 MG per tablet TAKE ONE (1) TABLET BY MOUTH EVERY DAY 30 tablet 4  . metFORMIN (GLUCOPHAGE) 500 MG tablet Take 1 tablet (500 mg total) by mouth daily with breakfast. 30 tablet 3   No current facility-administered medications for this visit.    OBJECTIVE: Young [place] American woman who appears tired but is in no acute distress Filed Vitals:   04/12/14 1438  BP: 131/66  Pulse: 91  Temp: 98.5 F (36.9 C)  Resp: 18     Body mass index is 46.61 kg/(m^2).    ECOG FS: 2   Filed Weights   04/12/14 1438  Weight: 271 lb 11.2 oz (123.242 kg)   Physical Exam: GENERAL: Patient is a well appearing female in no acute distress HEENT:  Sclerae anicteric.  Oropharynx clear and moist. No ulcerations or  evidence of oropharyngeal candidiasis. Neck is supple.  NODES:  No cervical, supraclavicular, or axillary lymphadenopathy palpated.  BREAST EXAM:  Left breast skin dimpling LUNGS:  Clear to auscultation bilaterally.  No wheezes or rhonchi. HEART:  Regular rate and rhythm. No murmur appreciated. ABDOMEN:  Soft, nontender.  Positive, normoactive bowel sounds. No organomegaly palpated. MSK:  No focal spinal tenderness to palpation. Full range of motion bilaterally in the upper extremities. EXTREMITIES:  No peripheral edema.   SKIN:  Clear with no obvious rashes or skin changes. No nail dyscrasia. NEURO:  Nonfocal. Well oriented.  Appropriate affect.       LAB RESULTS: Lab Results  Component Value Date   WBC 8.9 11/19/2013   NEUTROABS 5.4 11/19/2013   HGB 12.3 11/19/2013   HCT 37.9 11/19/2013   MCV 84.3 11/19/2013   PLT 274 11/19/2013      Chemistry      Component Value Date/Time   NA 139 11/19/2013 1007   NA 139 08/20/2013 1155   K 3.3* 11/19/2013 1007   K 3.6* 08/20/2013 1155   CL 100 08/20/2013 1155   CL 105 09/27/2012 1044   CO2 26 11/19/2013 1007   CO2 26 08/20/2013 1155   BUN 9.6 11/19/2013 1007   BUN 10 08/20/2013 1155   CREATININE 0.8 11/19/2013 1007   CREATININE 0.67 08/20/2013 1155      Component Value Date/Time   CALCIUM 9.3 11/19/2013 1007   CALCIUM 9.5 08/20/2013 1155   ALKPHOS 67 11/19/2013 1007   ALKPHOS 65 11/24/2011 0903   AST 11 11/19/2013 1007   AST 13 11/24/2011 0903   ALT 11 11/19/2013 1007   ALT 13 11/24/2011 0903   BILITOT 0.25 11/19/2013 1007   BILITOT 0.1* 11/24/2011 0903          ASSESSMENT: 38 year old BRCA1 and BRCA2 negative [place] woman, status post right breast biopsy in March 2011 for a high-grade invasive ductal carcinoma, ER positive 3%, PR positive 14%, strongly HER2/neu positive with MIB-1 of 15%. Biopsy proven lymph node involvement at presentation. Changes consistent with inflammatory carcinoma.   1. Treated  neoadjuvantly, status post 4 dose-dense cycles of doxorubicin/cyclophosphamide, followed by 12 weekly doses of paclitaxel with trastuzumab completed September 2011.   2. Trastuzumab was then continued for a total of 1 year [to June 2012]. Post-treatment echo showed a well-preserved ejection fraction.   3. Status post right  modified radical mastectomy in October 2011 showing a ypT1a ypN1 invasive ductal carcinoma, grade 3.  Status post right reconstruction with implant, March 2015   4. postmastectomy radiation completed in January 2012  5. on tamoxifen beginning January of 2012  6. other problems include chronic postsurgical pain, chronic right upper extremity lymphedema, and chronic depression  PLAN: Fotini is doing moderately well today.  She anxious and rightly so due to her left breast skin changes.  I gave her reassurance and ordered a left breast diagnostic mammogram and ultrasound.  We spent a great deal of time discussing the anxiety surrounded by this for her.  We will await those results.    I spent 25 minutes counseling the patient face to face.  The total time spent in the appointment was 30 minutes.  Minette Headland, [place] [phone]     04/12/2014

## 2014-04-12 NOTE — Telephone Encounter (Signed)
Called pt to let her know she will be seen today concerning her lt breast. I told her the time of her appt with Mendel Ryder Cornetto,NP. Pt confirmed she will be here. Message to be forwarded to Charlestine Massed, NP.

## 2014-04-16 ENCOUNTER — Encounter: Payer: Medicare Other | Attending: Physical Medicine & Rehabilitation | Admitting: Registered Nurse

## 2014-04-16 ENCOUNTER — Encounter: Payer: Self-pay | Admitting: Registered Nurse

## 2014-04-16 ENCOUNTER — Ambulatory Visit
Admission: RE | Admit: 2014-04-16 | Discharge: 2014-04-16 | Disposition: A | Discharge status: Home / Self Care | Payer: Medicare Other | Source: Ambulatory Visit | Attending: Adult Health | Admitting: Adult Health

## 2014-04-16 VITALS — BP 163/81 | HR 98 | Resp 14 | Ht 64.0 in | Wt 266.0 lb

## 2014-04-16 DIAGNOSIS — M546 Pain in thoracic spine: Secondary | ICD-10-CM | POA: Insufficient documentation

## 2014-04-16 DIAGNOSIS — Z79899 Other long term (current) drug therapy: Secondary | ICD-10-CM

## 2014-04-16 DIAGNOSIS — I890 Lymphedema, not elsewhere classified: Secondary | ICD-10-CM | POA: Diagnosis present

## 2014-04-16 DIAGNOSIS — M7918 Myalgia, other site: Secondary | ICD-10-CM

## 2014-04-16 DIAGNOSIS — M791 Myalgia: Secondary | ICD-10-CM

## 2014-04-16 DIAGNOSIS — Z5181 Encounter for therapeutic drug level monitoring: Secondary | ICD-10-CM

## 2014-04-16 DIAGNOSIS — Z872 Personal history of diseases of the skin and subcutaneous tissue: Secondary | ICD-10-CM

## 2014-04-16 DIAGNOSIS — G893 Neoplasm related pain (acute) (chronic): Secondary | ICD-10-CM

## 2014-04-16 MED ORDER — NAPROXEN 500 MG PO TABS: 500.0000 mg | ORAL_TABLET | Freq: Three times a day (TID) | ORAL | Status: DC | PRN

## 2014-04-16 MED ORDER — OXYCODONE HCL 5 MG PO TABS: 5.0000 mg | ORAL_TABLET | Freq: Four times a day (QID) | ORAL | Status: DC | PRN

## 2014-04-16 NOTE — Progress Notes (Signed)
Subjective:    Patient ID: Karen Hopkins, female    DOB: [birth year], 38 y.o.   MRN: [id number]  HPI: Karen Hopkins is a 38 year old female who returns for follow up for chronic pain and medication refill. She says her pain is located in her left breast, right shoulder and mid-back. She rates her pain 8. Her current exercise regime is walking. S/P trigger point injection a few days relief noted. She says her left breast very tender to the touch and reminds her of peau d'or-ange she was diagnosed with this in the past. Left breast looks dimpling. She has called the [facility] and is scheduled for US of the breast today. Also states" last week she was trying to kill a spider in her home, she sprayed solution and lost her balanced and fell on her right side. She didn't seek medical attention.  [place] Results: EXAM: DIGITAL DIAGNOSTIC LEFT MAMMOGRAM WITH CAD  ULTRASOUND LEFT BREAST  COMPARISON: 12/27/2013, 10/23/12, 10/06/2011  ACR Breast Density Category b: There are scattered areas of fibroglandular density.  FINDINGS: There are no suspicious findings. There is no evidence of mass or suspicious calcification. There is skin thickening involving the medial inferior left breast identical to 12/27/2013. This is new from 10/23/2012 and prior studies.  Mammographic images were processed with CAD.  On physical exam, the skin of of the inferior left breast and lower inner quadrant is visibly mildly thickened, but not consistent with peau d'orange. There is no calor and there is no palpable mass anywhere on the left. The left nipple is normal.  Ultrasound is performed, showing skin thickening to 4-63mm in the 5:00 through 8:00 position, without mass or other suspicious finding. Small oil cysts consistent with reduction mammoplasty are present in the upper inner and lower outer quadrants.  IMPRESSION: Skin thickening is unchanged from the most recent prior study which was  also perform status post reduction mammoplasty and is likely a post surgical defect. There is no evidence to suggest inflammatory breast carcinoma.  RECOMMENDATION: Diagnostic left mammogram and possibly ultrasound in 3 months  I have discussed the findings and recommendations with the patient. Results were also provided in writing at the conclusion of the visit. If applicable, a reminder letter will be sent to the patient regarding the next appointment.  BI-RADS CATEGORY 3: Probably benign.   Electronically Signed  By: Skipper Cliche M.D.  On: 04/16/2014 17:06   Pain Inventory Average Pain 8 Pain Right Now 8 My pain is constant, sharp, burning, dull, stabbing, tingling and aching  In the last 24 hours, has pain interfered with the following? General activity 7 Relation with others 7 Enjoyment of life 7 What TIME of day is your pain at its worst? morning,daytime,night Sleep (in general) Poor  Pain is worse with: walking, bending, sitting and standing Pain improves with: therapy/exercise and medication Relief from Meds: 7  Mobility walk without assistance ability to climb steps?  yes do you drive?  yes Do you have any goals in this area?  no  Function disabled: date disabled . Do you have any goals in this area?  no  Neuro/Psych numbness tingling spasms dizziness confusion depression anxiety  Prior Studies Any changes since last visit?  no  Physicians involved in your care Any changes since last visit?  no   Family History  Problem Relation Age of Onset  . Hypertension Mother   . Diabetes type II Father   . Prostate cancer Father  History   Social History  . Marital Status: Single    Spouse Name: N/A    Number of Children: N/A  . Years of Education: N/A   Social History Main Topics  . Smoking status: Former Smoker -- 0.25 packs/day for 5 years    Quit date: 04/26/2010  . Smokeless tobacco: Never Used  . Alcohol Use: No  . Drug  Use: No  . Sexual Activity: Not Currently    Birth Control/ Protection: Surgical   Other Topics Concern  . None   Social History Narrative   Past Surgical History  Procedure Laterality Date  . Supra-umbilical hernia  0102  . Nasal septum surgery    . Latissimus flap to breast Right 03/12/2013    Procedure: RIGHT BREAST LATISSIMUS FLAP WITH EXPANDER PLACEMENT;  Surgeon: Theodoro Kos, DO;  Location: [facility];  Service: Plastics;  Laterality: Right;  . Cesarean section  07/15/2001; 03/18/2004; 11/21/2008  . Tubal ligation  11/21/2008  . Portacath placement Left 09/12/2009  . Modified radical mastectomy Right 03/11/2010  . Port-a-cath removal Left 12/01/2010  . Removal of tissue expander and placement of implant Right 08/23/2013    Procedure: REMOVAL RIGHT TISSUE EXPANDER AND PLACEMENT OF IMPLANT TO RIGHT BREAST ;  Surgeon: Theodoro Kos, DO;  Location: [place];  Service: Plastics;  Laterality: Right;  . Breast reduction surgery Left 08/23/2013    Procedure: LEFT BREAST REDUCTION  ;  Surgeon: Theodoro Kos, DO;  Location: [place];  Service: Plastics;  Laterality: Left;  . Liposuction Bilateral 08/23/2013    Procedure: LIPOSUCTION;  Surgeon: Theodoro Kos, DO;  Location: [facility];  Service: Plastics;  Laterality: Bilateral;   Past Medical History  Diagnosis Date  . Seasonal allergies   . Lymphedema of arm     right; no BP or puncture to right arm  . Depression   . Anxiety   . Sinus headache   . Hypertension     under control with med., has been on med. x 2 yr.  . History of breast cancer 2011    right  . History of chemotherapy 2011  . History of radiation therapy 2011   BP 163/81 mmHg  Pulse 98  Resp 14  Ht 5\' 4"  (1.626 m)  Wt 266 lb (120.657 kg)  BMI 45.64 kg/m2  SpO2 100%  Opioid Risk Score:   Fall Risk Score: Moderate Fall Risk (6-13 points)  Review of Systems  Constitutional: Positive for unexpected weight change.    Musculoskeletal: Positive for back pain.  Neurological: Positive for dizziness and numbness.  Psychiatric/Behavioral: Positive for dysphoric mood. The patient is nervous/anxious.   All other systems reviewed and are negative.      Objective:   Physical Exam  Constitutional: She is oriented to Man, place, and time. She appears well-developed and well-nourished.  HENT:  Head: Normocephalic and atraumatic.  Neck: Normal range of motion. Neck supple.  Cardiovascular: Normal rate and regular rhythm.   Pulmonary/Chest: Effort normal and breath sounds normal.  Musculoskeletal:  Normal Muscle Bulk and Muscle Testing Reveals: Upper Extremities: Right: Decreased ROM 90 Degrees and Left Full ROM Right Arm Lymph Edema Right AC Joint Tenderness Right Spine of Scapula Tenderness Thoracic Paraspinal Tenderness: T-2- T-4 Lower  Extremities: Full ROM and Muscle Strength 5/5 Right Lower Extremity Flexion Produces Pain into Patella Arises from chair with ease Narrow Based Gait   Neurological: She is alert and oriented to [place], place, and time.  Skin: Skin  is warm and dry.  Psychiatric: She has a normal mood and affect.  Nursing note and vitals reviewed.         Assessment & Plan:  1.Myofascial pain syndrome chronic postoperative, as well as post radiation: S/P Breast Reconstruction Surgery x 2.  Refilled: Oxycodone 5 mg one tablet every 6 hours as needed for severe pain.#120 Continue with Exercise Regime.  RX: Referral for Physical Therapy   2. Obesity: Continue Healthy Diet and Exercise Routine.  3. Constipation: Increase Fiber and water intake. Instructions Given.  20 minutes of face to face patient care time was spent during this visit. All questions were encouraged and answered.  F/U in 1 month

## 2014-04-22 ENCOUNTER — Other Ambulatory Visit: Payer: Self-pay | Admitting: *Deleted

## 2014-04-22 DIAGNOSIS — Z872 Personal history of diseases of the skin and subcutaneous tissue: Secondary | ICD-10-CM

## 2014-04-22 MED ORDER — ALPRAZOLAM ER 1 MG PO TB24: ORAL_TABLET | ORAL | Status: DC

## 2014-04-29 ENCOUNTER — Ambulatory Visit: Payer: Medicare Other | Attending: Physical Medicine & Rehabilitation | Admitting: Physical Therapy

## 2014-04-29 ENCOUNTER — Encounter: Payer: Self-pay | Admitting: Physical Therapy

## 2014-04-29 DIAGNOSIS — E8989 Other postprocedural endocrine and metabolic complications and disorders: Secondary | ICD-10-CM

## 2014-04-29 DIAGNOSIS — M25611 Stiffness of right shoulder, not elsewhere classified: Secondary | ICD-10-CM

## 2014-04-29 DIAGNOSIS — M5489 Other dorsalgia: Secondary | ICD-10-CM | POA: Diagnosis present

## 2014-04-29 DIAGNOSIS — I890 Lymphedema, not elsewhere classified: Secondary | ICD-10-CM | POA: Diagnosis present

## 2014-04-29 NOTE — Therapy (Signed)
Physical Therapy Evaluation  Patient Details  Name: Karen Hopkins MRN: [id number] Date of Birth: Nov 25, 1975  Encounter Date: 04/29/2014      PT End of Session - 04/29/14 1014    Visit Number 1   Number of Visits 9   Date for PT Re-Evaluation 05/29/14   PT Start Time 0935   PT Stop Time 1017   PT Time Calculation (min) 42 min      Past Medical History  Diagnosis Date  . Seasonal allergies   . Lymphedema of arm     right; no BP or puncture to right arm  . Depression   . Anxiety   . Sinus headache   . Hypertension     under control with med., has been on med. x 2 yr.  . History of breast cancer 2011    right  . History of chemotherapy 2011  . History of radiation therapy 2011    Past Surgical History  Procedure Laterality Date  . Supra-umbilical hernia  2094  . Nasal septum surgery    . Latissimus flap to breast Right 03/12/2013    Procedure: RIGHT BREAST LATISSIMUS FLAP WITH EXPANDER PLACEMENT;  Surgeon: Theodoro Kos, DO;  Location: [facility];  Service: Plastics;  Laterality: Right;  . Cesarean section  07/15/2001; 03/18/2004; 11/21/2008  . Tubal ligation  11/21/2008  . Portacath placement Left 09/12/2009  . Modified radical mastectomy Right 03/11/2010  . Port-a-cath removal Left 12/01/2010  . Removal of tissue expander and placement of implant Right 08/23/2013    Procedure: REMOVAL RIGHT TISSUE EXPANDER AND PLACEMENT OF IMPLANT TO RIGHT BREAST ;  Surgeon: Theodoro Kos, DO;  Location: [place];  Service: Plastics;  Laterality: Right;  . Breast reduction surgery Left 08/23/2013    Procedure: LEFT BREAST REDUCTION  ;  Surgeon: Theodoro Kos, DO;  Location: [place] [facility];  Service: Plastics;  Laterality: Left;  . Liposuction Bilateral 08/23/2013    Procedure: LIPOSUCTION;  Surgeon: Theodoro Kos, DO;  Location: [facility];  Service: Plastics;  Laterality: Bilateral;    There were no vitals taken for this visit.  Visit Diagnosis:   Lymphedema of upper extremity following lymphadenectomy  Stiffness of joint, shoulder region, right  Right-sided back pain, unspecified location      Subjective Assessment - 04/29/14 0941    Currently in Pain? Yes   Pain Location Chest  also axilla, back where incision is   Pain Orientation Right   Pain Descriptors / Indicators Other (Comment);Aching;Spasm;Sore  stiff, hard   Pain Onset More than a month ago   Pain Frequency Constant   Aggravating Factors  staying in one position a long time; moving right arm the wrong way (causes spasm), such as with grabbing a seatbelt   Pain Relieving Factors pain meds (nothing helps the spasm)   Multiple Pain Sites Yes   Pain Score 5  up to 9 if not on pain med   Pain Type Acute pain   Pain Location Back  scapula and distal   Pain Orientation Right   Pain Descriptors / Indicators Sore   Pain Onset Other (Comment)  this pain from a recent fall at home          [facility] PT Assessment - 04/29/14 0001    Assessment   Medical Diagnosis h/o right breast cancer with mastectomy and reconstruction   Onset Date 04/26/14   Precautions   Precautions Other (comment)   Precaution Comments cancer precautions  Restrictions   Weight Bearing Restrictions No   Balance Screen   Has the patient fallen in the past 6 months Yes   How many times? once   Has the patient had a decrease in activity level because of a fear of falling?  Yes   Is the patient reluctant to leave their home because of a fear of falling?  No   AROM   Overall AROM  Deficits   Overall AROM Comments Left shoulder AROM WFL   Right Shoulder Extension 35 Degrees   Right Shoulder Flexion 104 Degrees  limited by pain   Right Shoulder ABduction 82 Degrees  hurts the newer back pain   Right Shoulder Internal Rotation 40 Degrees   Right Shoulder External Rotation 43 Degrees  approx.   Lumbar Flexion reaches fingertips 12 inches to floor   Lumbar Extension 50% loss (limited by fear of  falling and fear of pain)   Lumbar - Right Side Bend 20% loss   Lumbar - Left Side Bend WFL   Lumbar - Right Rotation 25% loss (limited by expectation of muscle spasm)   Lumbar - Left Rotation [facility]                  Plan - 2014-05-13 1017    Clinical Impression Statement Pt. will benefit from manual lymph drainage, monitoring of swelling, assistance with obtaining new compression garments, right shoulder ROM, and exercise for new back pain.   Pt will benefit from skilled therapeutic intervention in order to improve on the following deficits Increased edema;Pain;Decreased range of motion   Rehab Potential Good          G-Codes - 05/13/2014 1254    Functional Assessment Tool Used clinical judgement   Functional Limitation Other PT primary   Other PT Primary Current Status (M7672) At least 40 percent but less than 60 percent impaired, limited or restricted   Other PT Primary Goal Status (C9470) At least 1 percent but less than 20 percent impaired, limited or restricted      Problem List Patient Active Problem List   Diagnosis Date Noted  . Neoplasm related pain 03/12/2014  . CN (constipation) 11/26/2013  . H/O reduction mammoplasty 10/30/2013  . Seizure 10/06/2013  . Status post bilateral breast implants 08/31/2013  . Acquired absence of breast and absent nipple 08/23/2013  . S/P breast reconstruction, right 08/23/2013  . AC joint arthropathy 07/02/2013  . Routine adult health maintenance 05/29/2013  . Hypokalemia 05/21/2013  . Chronic pain 04/19/2013  . Elevated blood sugar 03/09/2013  . Wheezing 02/22/2013  . Acute bronchitis 02/22/2013  . Lymphedema 05/24/2011  . Breast cancer 08/19/2010  . Breast cancer, female 08/19/2010  . RHINOSINUSITIS, CHRONIC 04/08/2010  . OBESITY, NOS 08/04/2006  . DEPRESSIVE DISORDER, NOS 08/04/2006  . RHINITIS, ALLERGIC 08/04/2006            LYMPHEDEMA/ONCOLOGY QUESTIONNAIRE - May 13, 2014 0955    Right Upper Extremity Lymphedema    At Axilla  43 cm   15 cm Proximal to Olecranon Process 50.2 cm   10 cm Proximal to Olecranon Process 48.5 cm   Olecranon Process 33.2 cm   15 cm Proximal to Ulnar Styloid Process 31.4 cm   10 cm Proximal to Ulnar Styloid Process 27.8 cm   Just Proximal to Ulnar Styloid Process 19.1 cm   Across Hand at [employer] 20.2 cm   At [place] of 2nd Digit 6.5 cm   Left Upper Extremity Lymphedema   At Axilla  46 cm   15 cm Proximal to Olecranon Process 48.2 cm   10 cm Proximal to Olecranon Process 46 cm   Olecranon Process 31.2 cm   15 cm Proximal to Ulnar Styloid Process 31.7 cm   10 cm Proximal to Ulnar Styloid Process 27.7 cm   Just Proximal to Ulnar Styloid Process 18.7 cm   Across Hand at [employer] 21 cm   At [place] of 2nd Digit 6.4 cm                                        Long Term Clinic Goals - 04/29/14 1256    CC Long Term Goal  #1   Title Pt. will report at least 50% decrease in right thoracic and lumbar back pain.   Time 4   Period Weeks   Status New   CC Long Term Goal  #2   Title Pt. will be knowledgeable about obtaining new compression sleeve and glove, and process for that will be started.   Period Weeks   Status New   CC Long Term Goal  #3   Title Right arm circumference at 15 cm. proximal to olecranon will be reduced at least 0.5 cm.   Time 4   Period Leona Singleton, PT 04/29/2014, 12:59 PM

## 2014-05-08 ENCOUNTER — Ambulatory Visit: Payer: Medicare Other | Attending: Physical Medicine & Rehabilitation

## 2014-05-08 ENCOUNTER — Other Ambulatory Visit: Payer: Self-pay | Admitting: Registered Nurse

## 2014-05-08 DIAGNOSIS — I890 Lymphedema, not elsewhere classified: Secondary | ICD-10-CM | POA: Insufficient documentation

## 2014-05-08 DIAGNOSIS — M5489 Other dorsalgia: Secondary | ICD-10-CM | POA: Insufficient documentation

## 2014-05-08 DIAGNOSIS — M25611 Stiffness of right shoulder, not elsewhere classified: Secondary | ICD-10-CM | POA: Diagnosis present

## 2014-05-08 DIAGNOSIS — E8989 Other postprocedural endocrine and metabolic complications and disorders: Secondary | ICD-10-CM | POA: Diagnosis present

## 2014-05-08 NOTE — Therapy (Signed)
[place], [place], [address] Phone: [phone]   Fax:  [phone]  Physical Therapy Treatment  Patient Details  Name: Karen Hopkins MRN: [id number] Date of Birth: Apr 20, 1976  Encounter Date: 05/08/2014      PT End of Session - 05/08/14 1157    Visit Number 2   Number of Visits 9   Date for PT Re-Evaluation 05/29/14   PT Start Time 0936   PT Stop Time 5732   PT Time Calculation (min) 38 min      Past Medical History  Diagnosis Date  . Seasonal allergies   . Lymphedema of arm     right; no BP or puncture to right arm  . Depression   . Anxiety   . Sinus headache   . Hypertension     under control with med., has been on med. x 2 yr.  . History of breast cancer 2011    right  . History of chemotherapy 2011  . History of radiation therapy 2011    Past Surgical History  Procedure Laterality Date  . Supra-umbilical hernia  2025  . Nasal septum surgery    . Latissimus flap to breast Right 03/12/2013    Procedure: RIGHT BREAST LATISSIMUS FLAP WITH EXPANDER PLACEMENT;  Surgeon: Theodoro Kos, DO;  Location: [facility];  Service: Plastics;  Laterality: Right;  . Cesarean section  07/15/2001; 03/18/2004; 11/21/2008  . Tubal ligation  11/21/2008  . Portacath placement Left 09/12/2009  . Modified radical mastectomy Right 03/11/2010  . Port-a-cath removal Left 12/01/2010  . Removal of tissue expander and placement of implant Right 08/23/2013    Procedure: REMOVAL RIGHT TISSUE EXPANDER AND PLACEMENT OF IMPLANT TO RIGHT BREAST ;  Surgeon: Theodoro Kos, DO;  Location: [place];  Service: Plastics;  Laterality: Right;  . Breast reduction surgery Left 08/23/2013    Procedure: LEFT BREAST REDUCTION  ;  Surgeon: Theodoro Kos, DO;  Location: [place];  Service: Plastics;  Laterality: Left;  . Liposuction Bilateral 08/23/2013    Procedure: LIPOSUCTION;  Surgeon: Theodoro Kos, DO;  Location: [facility];  Service: Plastics;  Laterality: Bilateral;    There were no vitals taken for this visit.  Visit Diagnosis:  Stiffness of joint, shoulder region, right  Lymphedema of upper extremity following lymphadenectomy  Right-sided back pain, unspecified location      Subjective Assessment - 05/08/14 0941    Symptoms "I want my Rt arm to be smaller." After treatment pt reported her arm felt so much better being wrapped.            [facility] Adult PT Treatment/Exercise - 05/08/14 0001    Manual Therapy   Manual Lymphatic Drainage (MLD) In supine: Short neck, Lt axilla and Rt inguinal nodes, anterior inter-axillary and Rt axillo-inguinal anastamosis, Rt UE from dorsal hand to lateral shoulder.   Compression Bandaging Lotion applied, stockinette, Elasomull to first 4 fingers, Artiflex, 1-6 cm and 2-12 cm short stretch compression bandages from hand to axilla.                Plan - 05/08/14 1158    Clinical Impression Statement Pts upper arm felt slightly firm at beginning of treatment today but noticed tissue softened after manual lymph drainage.    Pt will benefit from skilled therapeutic intervention in order to improve on the following deficits Increased edema;Pain;Decreased range of motion   Rehab Potential Good   PT Next Visit Plan  Assess bandaging and continue per primary plan of care.                               Problem List Patient Active Problem List   Diagnosis Date Noted  . Neoplasm related pain 03/12/2014  . CN (constipation) 11/26/2013  . H/O reduction mammoplasty 10/30/2013  . Seizure 10/06/2013  . Status post bilateral breast implants 08/31/2013  . Acquired absence of breast and absent nipple 08/23/2013  . S/P breast reconstruction, right 08/23/2013  . AC joint arthropathy 07/02/2013  . Routine adult health maintenance 05/29/2013  . Hypokalemia 05/21/2013  . Chronic pain 04/19/2013  . Elevated blood sugar  03/09/2013  . Wheezing 02/22/2013  . Acute bronchitis 02/22/2013  . Lymphedema 05/24/2011  . Breast cancer 08/19/2010  . Breast cancer, female 08/19/2010  . RHINOSINUSITIS, CHRONIC 04/08/2010  . OBESITY, NOS 08/04/2006  . DEPRESSIVE DISORDER, NOS 08/04/2006  . RHINITIS, ALLERGIC 08/04/2006    Otelia Limes, PTA 05/08/2014, 12:08 PM

## 2014-05-08 NOTE — Telephone Encounter (Signed)
Called for refill on her tizanidine.  Sent to pharmacy

## 2014-05-10 ENCOUNTER — Telehealth: Payer: Self-pay | Admitting: *Deleted

## 2014-05-10 ENCOUNTER — Other Ambulatory Visit: Payer: Self-pay | Admitting: *Deleted

## 2014-05-10 ENCOUNTER — Ambulatory Visit: Payer: Medicare Other | Admitting: Physical Therapy

## 2014-05-10 DIAGNOSIS — M25611 Stiffness of right shoulder, not elsewhere classified: Secondary | ICD-10-CM

## 2014-05-10 DIAGNOSIS — E8989 Other postprocedural endocrine and metabolic complications and disorders: Secondary | ICD-10-CM | POA: Diagnosis not present

## 2014-05-10 DIAGNOSIS — I890 Lymphedema, not elsewhere classified: Principal | ICD-10-CM

## 2014-05-10 MED ORDER — TIZANIDINE HCL 4 MG PO TABS: 4.0000 mg | ORAL_TABLET | Freq: Three times a day (TID) | ORAL | Status: DC

## 2014-05-10 NOTE — Telephone Encounter (Signed)
Needs refill on Zanaflex

## 2014-05-10 NOTE — Telephone Encounter (Signed)
Sent refill in electronically

## 2014-05-10 NOTE — Therapy (Signed)
[place], [place], [address] Phone: [phone]   Fax:  [phone]  Physical Therapy Treatment  Patient Details  Name: Karen Hopkins MRN: [id number] Date of Birth: 18-Jun-1975  Encounter Date: 05/10/2014      PT End of Session - 05/10/14 1333    Visit Number 3   Number of Visits 9   Date for PT Re-Evaluation 05/29/14   PT Start Time 0940   PT Stop Time 1019   PT Time Calculation (min) 39 min      Past Medical History  Diagnosis Date  . Seasonal allergies   . Lymphedema of arm     right; no BP or puncture to right arm  . Depression   . Anxiety   . Sinus headache   . Hypertension     under control with med., has been on med. x 2 yr.  . History of breast cancer 2011    right  . History of chemotherapy 2011  . History of radiation therapy 2011    Past Surgical History  Procedure Laterality Date  . Supra-umbilical hernia  6440  . Nasal septum surgery    . Latissimus flap to breast Right 03/12/2013    Procedure: RIGHT BREAST LATISSIMUS FLAP WITH EXPANDER PLACEMENT;  Surgeon: Theodoro Kos, DO;  Location: [place];  Service: Plastics;  Laterality: Right;  . Cesarean section  07/15/2001; 03/18/2004; 11/21/2008  . Tubal ligation  11/21/2008  . Portacath placement Left 09/12/2009  . Modified radical mastectomy Right 03/11/2010  . Port-a-cath removal Left 12/01/2010  . Removal of tissue expander and placement of implant Right 08/23/2013    Procedure: REMOVAL RIGHT TISSUE EXPANDER AND PLACEMENT OF IMPLANT TO RIGHT BREAST ;  Surgeon: Theodoro Kos, DO;  Location: [place];  Service: Plastics;  Laterality: Right;  . Breast reduction surgery Left 08/23/2013    Procedure: LEFT BREAST REDUCTION  ;  Surgeon: Theodoro Kos, DO;  Location: [place];  Service: Plastics;  Laterality: Left;  . Liposuction Bilateral 08/23/2013    Procedure: LIPOSUCTION;  Surgeon: Theodoro Kos, DO;  Location: [place];  Service: Plastics;  Laterality: Bilateral;    There were no vitals taken for this visit.  Visit Diagnosis:  Lymphedema of upper extremity following lymphadenectomy  Stiffness of joint, shoulder region, right      Subjective Assessment - 05/10/14 1329    Symptoms "I needed another wrap at the upper arm--it was too loose."  Tolerated bandages well; they came off yesterday.            [facility] Adult PT Treatment/Exercise - 05/10/14 0001    Manual Therapy   Manual Lymphatic Drainage (MLD) In supine, short neck, left axilla and anterior interaxillary anastomosis, right groin and axillo-inguinal anastomosis, and right UE from fingers to shoulder.   Compression Bandaging Biotone lotion applied, then bandaging with stockinette, Elastomull on all five fingers, Artiflex x 2, and 1-6 cm., 1-10 cm., and 2-12 cm. bandages from right hand to shoulder.                Plan - 05/10/14 1333    Clinical Impression Statement May need another bandage added or just snug bandages further next visit, depending on how well they held this time.  Consider increasing frequency to 3x/week for bandaging.     Pt will benefit from skilled therapeutic intervention in order to improve on the following deficits Increased edema;Pain;Decreased range of motion   Rehab  Potential Good   PT Next Visit Plan Remeasure circumferences; consider increasing frequency to 3x/week for bandaging.  Continue complete decongestive therapy if benefit is seen.                               Problem List Patient Active Problem List   Diagnosis Date Noted  . Neoplasm related pain 03/12/2014  . CN (constipation) 11/26/2013  . H/O reduction mammoplasty 10/30/2013  . Seizure 10/06/2013  . Status post bilateral breast implants 08/31/2013  . Acquired absence of breast and absent nipple 08/23/2013  . S/P breast reconstruction, right 08/23/2013  . AC joint arthropathy 07/02/2013  .  Routine adult health maintenance 05/29/2013  . Hypokalemia 05/21/2013  . Chronic pain 04/19/2013  . Elevated blood sugar 03/09/2013  . Wheezing 02/22/2013  . Acute bronchitis 02/22/2013  . Lymphedema 05/24/2011  . Breast cancer 08/19/2010  . Breast cancer, female 08/19/2010  . RHINOSINUSITIS, CHRONIC 04/08/2010  . OBESITY, NOS 08/04/2006  . DEPRESSIVE DISORDER, NOS 08/04/2006  . RHINITIS, ALLERGIC 08/04/2006    SALISBURY,DONNA 05/10/2014, 1:37 PM  SALISBURY,DONNA, PT

## 2014-05-13 ENCOUNTER — Other Ambulatory Visit: Payer: Self-pay | Admitting: *Deleted

## 2014-05-13 DIAGNOSIS — C50919 Malignant neoplasm of unspecified site of unspecified female breast: Secondary | ICD-10-CM

## 2014-05-14 ENCOUNTER — Ambulatory Visit: Payer: Medicare Other | Admitting: Physical Therapy

## 2014-05-14 ENCOUNTER — Other Ambulatory Visit: Payer: Medicare Other

## 2014-05-14 DIAGNOSIS — E8989 Other postprocedural endocrine and metabolic complications and disorders: Secondary | ICD-10-CM

## 2014-05-14 DIAGNOSIS — I890 Lymphedema, not elsewhere classified: Principal | ICD-10-CM

## 2014-05-14 DIAGNOSIS — M5489 Other dorsalgia: Secondary | ICD-10-CM

## 2014-05-14 DIAGNOSIS — M25611 Stiffness of right shoulder, not elsewhere classified: Secondary | ICD-10-CM

## 2014-05-14 NOTE — Therapy (Signed)
[place], [place], [address] Phone: [phone]   Fax:  [phone]  Physical Therapy Treatment  Patient Details  Name: Karen Hopkins MRN: [id number] Date of Birth: 1975-07-27  Encounter Date: 05/14/2014      PT End of Session - 05/14/14 1704    Visit Number 4   Number of Visits 9   Date for PT Re-Evaluation 05/29/14   PT Start Time 0930   PT Stop Time 1015   PT Time Calculation (min) 45 min      Past Medical History  Diagnosis Date  . Seasonal allergies   . Lymphedema of arm     right; no BP or puncture to right arm  . Depression   . Anxiety   . Sinus headache   . Hypertension     under control with med., has been on med. x 2 yr.  . History of breast cancer 2011    right  . History of chemotherapy 2011  . History of radiation therapy 2011    Past Surgical History  Procedure Laterality Date  . Supra-umbilical hernia  9767  . Nasal septum surgery    . Latissimus flap to breast Right 03/12/2013    Procedure: RIGHT BREAST LATISSIMUS FLAP WITH EXPANDER PLACEMENT;  Surgeon: Theodoro Kos, DO;  Location: [facility];  Service: Plastics;  Laterality: Right;  . Cesarean section  07/15/2001; 03/18/2004; 11/21/2008  . Tubal ligation  11/21/2008  . Portacath placement Left 09/12/2009  . Modified radical mastectomy Right 03/11/2010  . Port-a-cath removal Left 12/01/2010  . Removal of tissue expander and placement of implant Right 08/23/2013    Procedure: REMOVAL RIGHT TISSUE EXPANDER AND PLACEMENT OF IMPLANT TO RIGHT BREAST ;  Surgeon: Theodoro Kos, DO;  Location: [facility];  Service: Plastics;  Laterality: Right;  . Breast reduction surgery Left 08/23/2013    Procedure: LEFT BREAST REDUCTION  ;  Surgeon: Theodoro Kos, DO;  Location: [facility];  Service: Plastics;  Laterality: Left;  . Liposuction Bilateral 08/23/2013    Procedure: LIPOSUCTION;  Surgeon: Theodoro Kos, DO;  Location: [facility];  Service: Plastics;  Laterality: Bilateral;    There were no vitals taken for this visit.  Visit Diagnosis:  Lymphedema of upper extremity following lymphadenectomy  Stiffness of joint, shoulder region, right  Right-sided back pain, unspecified location      Subjective Assessment - 05/14/14 0934    Symptoms I took it off last night so I could take shower. The wrapping is helping   Currently in Pain? No/denies  just stiffness and muscle spasms under right arm   Pain Relieving Factors took pain med this am            [facility] Adult PT Treatment/Exercise - 05/14/14 1702    Neck Exercises: Stretches   Lower Cervical/Upper Thoracic Stretch 2 reps   Lower Cervical/Upper Thoracic Stretch Limitations rotation 2 reps   Neck Exercises: Seated   Cervical Rotation Both  1 rep   Lateral Flexion Both  2 reps   Shoulder Shrugs 5 reps   Shoulder Rolls 5 reps;Backwards   Other Seated Exercise modfied cobra   Other Seated Exercise neural glide with head rotaton       Manual lymph drainage in supine as follows: short neck, left axillary nodes, right inguinal nodes, superficial and deep abdominals; anterior inter-axillary anastamoses, right axillo-inguinal anastamoses. Right upper extremity from fingers and dorsal hand to lateral shoulder redirecting along pathways.  Lotion applied, thick stockineette elastomull to figers 1-5, 2 artiflex, 4 short stretch banadages, another piece of large tg soft applied over upper arm with another short stretch bandage. Pt stated that felt better          Plan - 05/14/14 1705    Clinical Impression Statement consider strength training program.  Pt talked about joining [employer] after christmas. Added another piece of thick stockinette and bandage for more compression high up on arm    PT Next Visit Plan Remeasure circumference if patient comes in with bandages intact Begin strenght training program  Reassess goals            Long Term Clinic Goals - 05/14/14 1709    CC Long Term Goal  #1   Title Pt. will report at least 50% decrease in right thoracic and lumbar back pain.   Time 4   Period Weeks   Status On-going   CC Long Term Goal  #2   Title Pt. will be knowledgeable about obtaining new compression sleeve and glove, and process for that will be started.   Time 4   Period Weeks   Status On-going   CC Long Term Goal  #3   Title Right arm circumference at 15 cm. proximal to olecranon will be reduced at least 0.5 cm.   Period Weeks   Status On-going         Problem List Patient Active Problem List   Diagnosis Date Noted  . Neoplasm related pain 03/12/2014  . CN (constipation) 11/26/2013  . H/O reduction mammoplasty 10/30/2013  . Seizure 10/06/2013  . Status post bilateral breast implants 08/31/2013  . Acquired absence of breast and absent nipple 08/23/2013  . S/P breast reconstruction, right 08/23/2013  . AC joint arthropathy 07/02/2013  . Routine adult health maintenance 05/29/2013  . Hypokalemia 05/21/2013  . Chronic pain 04/19/2013  . Elevated blood sugar 03/09/2013  . Wheezing 02/22/2013  . Acute bronchitis 02/22/2013  . Lymphedema 05/24/2011  . Breast cancer 08/19/2010  . Breast cancer, female 08/19/2010  . RHINOSINUSITIS, CHRONIC 04/08/2010  . OBESITY, NOS 08/04/2006  . DEPRESSIVE DISORDER, NOS 08/04/2006  . RHINITIS, ALLERGIC 08/04/2006   Donato Heinz. Owens Shark, PT   05/14/2014, 5:11 PM

## 2014-05-15 ENCOUNTER — Encounter: Payer: Medicare Other | Attending: Physical Medicine & Rehabilitation | Admitting: Registered Nurse

## 2014-05-15 DIAGNOSIS — M546 Pain in thoracic spine: Secondary | ICD-10-CM | POA: Insufficient documentation

## 2014-05-15 DIAGNOSIS — I890 Lymphedema, not elsewhere classified: Secondary | ICD-10-CM | POA: Insufficient documentation

## 2014-05-17 ENCOUNTER — Ambulatory Visit (HOSPITAL_BASED_OUTPATIENT_CLINIC_OR_DEPARTMENT_OTHER): Payer: Medicare Other | Admitting: Physical Medicine & Rehabilitation

## 2014-05-17 ENCOUNTER — Encounter: Payer: Self-pay | Admitting: Physical Medicine & Rehabilitation

## 2014-05-17 ENCOUNTER — Ambulatory Visit: Payer: Medicare Other | Admitting: Physical Therapy

## 2014-05-17 VITALS — BP 138/90 | HR 99 | Resp 14 | Ht 64.0 in | Wt 280.0 lb

## 2014-05-17 DIAGNOSIS — G893 Neoplasm related pain (acute) (chronic): Secondary | ICD-10-CM

## 2014-05-17 DIAGNOSIS — I890 Lymphedema, not elsewhere classified: Secondary | ICD-10-CM | POA: Diagnosis present

## 2014-05-17 DIAGNOSIS — M25611 Stiffness of right shoulder, not elsewhere classified: Secondary | ICD-10-CM

## 2014-05-17 DIAGNOSIS — M546 Pain in thoracic spine: Secondary | ICD-10-CM | POA: Diagnosis present

## 2014-05-17 DIAGNOSIS — M609 Myositis, unspecified: Secondary | ICD-10-CM

## 2014-05-17 DIAGNOSIS — E8989 Other postprocedural endocrine and metabolic complications and disorders: Secondary | ICD-10-CM | POA: Diagnosis not present

## 2014-05-17 DIAGNOSIS — M791 Myalgia: Secondary | ICD-10-CM

## 2014-05-17 DIAGNOSIS — IMO0001 Reserved for inherently not codable concepts without codable children: Secondary | ICD-10-CM

## 2014-05-17 DIAGNOSIS — M5489 Other dorsalgia: Secondary | ICD-10-CM

## 2014-05-17 MED ORDER — OXYCODONE HCL 5 MG PO TABS: 5.0000 mg | ORAL_TABLET | Freq: Four times a day (QID) | ORAL | Status: DC | PRN

## 2014-05-17 NOTE — Therapy (Signed)
[place], [place], [address] Phone: [phone]   Fax:  [phone]  Physical Therapy Treatment  Patient Details  Name: Karen Hopkins MRN: [id number] Date of Birth: [birth year]  Encounter Date: 05/17/2014      PT End of Session - 05/17/14 1350    Visit Number 6   Number of Visits 9   Date for PT Re-Evaluation 05/29/14      Past Medical History  Diagnosis Date  . Seasonal allergies   . Lymphedema of arm     right; no BP or puncture to right arm  . Depression   . Anxiety   . Sinus headache   . Hypertension     under control with med., has been on med. x 2 yr.  . History of breast cancer 2011    right  . History of chemotherapy 2011  . History of radiation therapy 2011    Past Surgical History  Procedure Laterality Date  . Supra-umbilical hernia  8338  . Nasal septum surgery    . Latissimus flap to breast Right 03/12/2013    Procedure: RIGHT BREAST LATISSIMUS FLAP WITH EXPANDER PLACEMENT;  Surgeon: Theodoro Kos, DO;  Location: [facility];  Service: Plastics;  Laterality: Right;  . Cesarean section  07/15/2001; 03/18/2004; 11/21/2008  . Tubal ligation  11/21/2008  . Portacath placement Left 09/12/2009  . Modified radical mastectomy Right 03/11/2010  . Port-a-cath removal Left 12/01/2010  . Removal of tissue expander and placement of implant Right 08/23/2013    Procedure: REMOVAL RIGHT TISSUE EXPANDER AND PLACEMENT OF IMPLANT TO RIGHT BREAST ;  Surgeon: Theodoro Kos, DO;  Location: [facility];  Service: Plastics;  Laterality: Right;  . Breast reduction surgery Left 08/23/2013    Procedure: LEFT BREAST REDUCTION  ;  Surgeon: Theodoro Kos, DO;  Location: [facility];  Service: Plastics;  Laterality: Left;  . Liposuction Bilateral 08/23/2013    Procedure: LIPOSUCTION;  Surgeon: Theodoro Kos, DO;  Location: [facility];  Service: Plastics;  Laterality: Bilateral;    There  were no vitals taken for this visit.  Visit Diagnosis:  Lymphedema of upper extremity following lymphadenectomy  Stiffness of joint, shoulder region, right  Right-sided back pain, unspecified location      Subjective Assessment - 05/17/14 1344    Symptoms pt states the wrap came off last night. She feels that her arm had gone down and the especially her upper arm is softer.  She thinks her compression sleeve will fit her now, but she wants to be wrapped one more time.   Currently in Pain? No/denies  she does c/o right shoulder pain with exercise            [facility] Adult PT Treatment/Exercise - 05/17/14 0939    Neck Exercises: Stretches   Lower Cervical/Upper Thoracic Stretch 2 reps   Lower Cervical/Upper Thoracic Stretch Limitations rotation 2 reps   Neck Exercises: Seated   Cervical Rotation Both  1 rep   Lateral Flexion Both  2 reps   Shoulder Shrugs 5 reps   Shoulder Rolls 5 reps;Backwards   Other Seated Exercise modfied cobra   Other Seated Exercise neural glide with head rotaton   Lumbar Exercises: Supine   Bridge 10 reps   Other Supine Lumbar Exercises quaduped alternate arm and leg 5 times each side   Lumbar Exercises: Sidelying   Clam 5 reps  on each side   Shoulder Exercises: Supine   Other  Supine Exercises chest press 2 sets of 10 with no weiight   Shoulder Exercises: Stretch   Cross Chest Stretch 3 reps   Wall Stretch - Flexion 3 reps   Wall Stretch - ABduction 3 reps     Remeasured arm.  Lotion applied.  Compression bandaging with thin stockinette, elastomull to fingers 1-4, artiflex x2, 3 short stretch bandages, Then, thick stockinette to upper arm to axilla and 2 more short srtetch bandages with extra support on upper arm.  Encouaged pt to wear compression garments and use flexitouch when bandages are off         Plan - 05/17/14 1351    Clinical Impression Statement lymphedema flare up is improving. Pt should now be able to use her compression  sleeves and flexitouch at home to manage her edema. she is ready to begin strength ABC program   PT Next Visit Plan Teach Strength ABC program and logging   Consulted and Agree with Plan of Care Patient              LYMPHEDEMA/ONCOLOGY QUESTIONNAIRE - 05/17/14 0957    Right Upper Extremity Lymphedema   At Axilla  41.2 cm   15 cm Proximal to Olecranon Process 46.7 cm   10 cm Proximal to Olecranon Process 45.6 cm   Olecranon Process 31.2 cm   15 cm Proximal to Ulnar Styloid Process 31.4 cm   10 cm Proximal to Ulnar Styloid Process 27.8 cm   Just Proximal to Ulnar Styloid Process 17.5 cm   Across Hand at [employer] 20.2 cm   At [place] of 2nd Digit 6.1 cm        Problem List Patient Active Problem List   Diagnosis Date Noted  . Neoplasm related pain 03/12/2014  . CN (constipation) 11/26/2013  . H/O reduction mammoplasty 10/30/2013  . Seizure 10/06/2013  . Status post bilateral breast implants 08/31/2013  . Acquired absence of breast and absent nipple 08/23/2013  . S/P breast reconstruction, right 08/23/2013  . AC joint arthropathy 07/02/2013  . Routine adult health maintenance 05/29/2013  . Hypokalemia 05/21/2013  . Chronic pain 04/19/2013  . Elevated blood sugar 03/09/2013  . Wheezing 02/22/2013  . Acute bronchitis 02/22/2013  . Lymphedema 05/24/2011  . Breast cancer 08/19/2010  . Breast cancer, female 08/19/2010  . RHINOSINUSITIS, CHRONIC 04/08/2010  . OBESITY, NOS 08/04/2006  . DEPRESSIVE DISORDER, NOS 08/04/2006  . RHINITIS, ALLERGIC 08/04/2006   Donato Heinz. Owens Shark, PT    05/17/2014, 1:53 PM

## 2014-05-17 NOTE — Progress Notes (Signed)
Subjective:    Patient ID: Karen Hopkins, female    DOB: Oct 18, 1975, 38 y.o.   MRN: [id number]  HPI Pt had evaluation of Left breast dimpling by oncology, benign lesion. Started PT for Lymphedema wrapping 12/2, and was re wrapped today  Still feels very tight in the R neck/shoulder blade area No numbness or tingling in the Left fingers Pain Inventory Average Pain 8 Pain Right Now 7 My pain is constant, sharp, burning, dull, stabbing, tingling and aching  In the last 24 hours, has pain interfered with the following? General activity 1 Relation with others 1 Enjoyment of life 1 What TIME of day is your pain at its worst? morning, evening and night Sleep (in general) Poor  Pain is worse with: bending, inactivity and standing Pain improves with: therapy/exercise and medication Relief from Meds: 8  Mobility walk without assistance how many minutes can you walk? 10 ability to climb steps?  yes do you drive?  yes  Function disabled: date disabled .  Neuro/Psych weakness numbness tingling trouble walking spasms dizziness confusion depression anxiety  Prior Studies Any changes since last visit?  no  Physicians involved in your care Any changes since last visit?  no   Family History  Problem Relation Age of Onset  . Hypertension Mother   . Diabetes type II Father   . Prostate cancer Father    History   Social History  . Marital Status: Single    Spouse Name: N/A    Number of Children: N/A  . Years of Education: N/A   Social History Main Topics  . Smoking status: Former Smoker -- 0.25 packs/day for 5 years    Quit date: 04/26/2010  . Smokeless tobacco: Never Used  . Alcohol Use: No  . Drug Use: No  . Sexual Activity: Not Currently    Birth Control/ Protection: Surgical   Other Topics Concern  . None   Social History Narrative   Past Surgical History  Procedure Laterality Date  . Supra-umbilical hernia  2409  . Nasal septum surgery    .  Latissimus flap to breast Right 03/12/2013    Procedure: RIGHT BREAST LATISSIMUS FLAP WITH EXPANDER PLACEMENT;  Surgeon: Theodoro Kos, DO;  Location: [facility];  Service: Plastics;  Laterality: Right;  . Cesarean section  07/15/2001; 03/18/2004; 11/21/2008  . Tubal ligation  11/21/2008  . Portacath placement Left 09/12/2009  . Modified radical mastectomy Right 03/11/2010  . Port-a-cath removal Left 12/01/2010  . Removal of tissue expander and placement of implant Right 08/23/2013    Procedure: REMOVAL RIGHT TISSUE EXPANDER AND PLACEMENT OF IMPLANT TO RIGHT BREAST ;  Surgeon: Theodoro Kos, DO;  Location: [facility];  Service: Plastics;  Laterality: Right;  . Breast reduction surgery Left 08/23/2013    Procedure: LEFT BREAST REDUCTION  ;  Surgeon: Theodoro Kos, DO;  Location: [facility];  Service: Plastics;  Laterality: Left;  . Liposuction Bilateral 08/23/2013    Procedure: LIPOSUCTION;  Surgeon: Theodoro Kos, DO;  Location: [facility];  Service: Plastics;  Laterality: Bilateral;   Past Medical History  Diagnosis Date  . Seasonal allergies   . Lymphedema of arm     right; no BP or puncture to right arm  . Depression   . Anxiety   . Sinus headache   . Hypertension     under control with med., has been on med. x 2 yr.  . History of breast cancer 2011  right  . History of chemotherapy 2011  . History of radiation therapy 2011   BP 138/90 mmHg  Pulse 99  Resp 14  Ht 5\' 4"  (1.626 m)  Wt 280 lb (127.007 kg)  BMI 48.04 kg/m2  SpO2 100%  Opioid Risk Score:   Fall Risk Score: Low Fall Risk (0-5 points)  Review of Systems  HENT: Negative.   Eyes: Negative.   Respiratory: Negative.   Cardiovascular: Negative.   Gastrointestinal: Negative.   Endocrine: Negative.   Genitourinary: Negative.   Musculoskeletal: Positive for myalgias and back pain.  Skin: Negative.   Allergic/Immunologic: Negative.   Neurological: Positive for dizziness, weakness  and numbness.  Hematological: Negative.   Psychiatric/Behavioral: Positive for dysphoric mood. The patient is nervous/anxious.        Confusion       Objective:   Physical Exam  Constitutional: She is oriented to [place], place, and time. She appears well-developed and well-nourished.  HENT:  Head: Normocephalic and atraumatic.  Eyes: Conjunctivae are normal. Pupils are equal, round, and reactive to light.  Neck: Normal range of motion.  Musculoskeletal:       Right shoulder: She exhibits decreased range of motion, tenderness and swelling.       Right elbow: She exhibits swelling.       Right wrist: She exhibits swelling.       Cervical back: She exhibits tenderness and spasm. She exhibits no deformity.       Right hand: She exhibits swelling. Decreased strength noted.  Lymphedema RUE  Wrist palpation in the right upper trapezius as well as right levator scapula muscle  Neurological: She is alert and oriented to [place], place, and time.  Psychiatric: She has a normal mood and affect.  Nursing note and vitals reviewed.         Assessment & Plan:  1. Neoplasm-related pain right upper extremity with chronic lymphedema. Continue oxycodone5 mg 4 times a day Continue opioid monitoring program. This consists of regular clinic visits, examinations, urine drug screen, pill counts as well as use of [place] controlled substance reporting System.  2. Myofascial pain related to above right trapezius and right levator scapula Will try dry needling with acupuncture needle 0.25 mm  Diameter, 25 mm length stainless steel sterile acupuncture needle used, 1 for muscle Trigger point De activated through repeated needling Patient tolerated procedure well without immediate relief

## 2014-05-17 NOTE — Patient Instructions (Signed)
We dry needled R levator scapula and Right trap

## 2014-05-21 ENCOUNTER — Other Ambulatory Visit (HOSPITAL_BASED_OUTPATIENT_CLINIC_OR_DEPARTMENT_OTHER): Payer: Medicare Other

## 2014-05-21 ENCOUNTER — Telehealth: Payer: Self-pay | Admitting: Oncology

## 2014-05-21 DIAGNOSIS — C50919 Malignant neoplasm of unspecified site of unspecified female breast: Secondary | ICD-10-CM

## 2014-05-21 DIAGNOSIS — C50411 Malignant neoplasm of upper-outer quadrant of right female breast: Secondary | ICD-10-CM

## 2014-05-21 LAB — CBC WITH DIFFERENTIAL/PLATELET
BASO%: 0.7 % (ref 0.0–2.0)
Basophils Absolute: 0.1 10e3/uL (ref 0.0–0.1)
EOS%: 4.1 % (ref 0.0–7.0)
Eosinophils Absolute: 0.5 10e3/uL (ref 0.0–0.5)
HCT: 37.5 % (ref 34.8–46.6)
HGB: 12.2 g/dL (ref 11.6–15.9)
LYMPH%: 33.8 % (ref 14.0–49.7)
MCH: 28.3 pg (ref 25.1–34.0)
MCHC: 32.6 g/dL (ref 31.5–36.0)
MCV: 87.1 fL (ref 79.5–101.0)
MONO#: 0.7 10e3/uL (ref 0.1–0.9)
MONO%: 5.7 % (ref 0.0–14.0)
NEUT#: 6.5 10e3/uL (ref 1.5–6.5)
NEUT%: 55.7 % (ref 38.4–76.8)
Platelets: 280 10e3/uL (ref 145–400)
RBC: 4.31 10e6/uL (ref 3.70–5.45)
RDW: 14.5 % (ref 11.2–14.5)
WBC: 11.6 10e3/uL — ABNORMAL HIGH (ref 3.9–10.3)
lymph#: 3.9 10e3/uL — ABNORMAL HIGH (ref 0.9–3.3)

## 2014-05-21 LAB — COMPREHENSIVE METABOLIC PANEL (CC13)
ALT: 15 U/L (ref 0–55)
AST: 13 U/L (ref 5–34)
Albumin: 3.2 g/dL — ABNORMAL LOW (ref 3.5–5.0)
Alkaline Phosphatase: 73 U/L (ref 40–150)
Anion Gap: 11 meq/L (ref 3–11)
BUN: 14.3 mg/dL (ref 7.0–26.0)
CO2: 23 meq/L (ref 22–29)
Calcium: 9.1 mg/dL (ref 8.4–10.4)
Chloride: 103 meq/L (ref 98–109)
Creatinine: 0.8 mg/dL (ref 0.6–1.1)
EGFR: >90 ml/min/1.73 m2
Glucose: 159 mg/dL — ABNORMAL HIGH (ref 70–140)
Potassium: 3.4 meq/L — ABNORMAL LOW (ref 3.5–5.1)
Sodium: 138 meq/L (ref 136–145)
Total Bilirubin: 0.2 mg/dL (ref 0.20–1.20)
Total Protein: 6.9 g/dL (ref 6.4–8.3)

## 2014-05-21 NOTE — Telephone Encounter (Signed)
pt cld to r/s lab appt to today-sch & gave pt copy of sch

## 2014-05-22 ENCOUNTER — Ambulatory Visit: Payer: Medicare Other | Admitting: Physical Therapy

## 2014-05-22 DIAGNOSIS — I890 Lymphedema, not elsewhere classified: Principal | ICD-10-CM

## 2014-05-22 DIAGNOSIS — E8989 Other postprocedural endocrine and metabolic complications and disorders: Secondary | ICD-10-CM | POA: Diagnosis not present

## 2014-05-22 DIAGNOSIS — M5489 Other dorsalgia: Secondary | ICD-10-CM

## 2014-05-22 DIAGNOSIS — M25611 Stiffness of right shoulder, not elsewhere classified: Secondary | ICD-10-CM

## 2014-05-22 NOTE — Therapy (Signed)
[place], [place], [address] Phone: [phone]   Fax:  [phone]  Physical Therapy Treatment  Patient Details  Name: Karen Hopkins MRN: [id number] Date of Birth: 04/25/1976  Encounter Date: 05/22/2014      PT End of Session - 05/22/14 1210    Visit Number 7   Number of Visits 9   Date for PT Re-Evaluation 05/29/14   PT Start Time 1110   PT Stop Time 1150   PT Time Calculation (min) 40 min   Behavior During Therapy [facility] for tasks assessed/performed      Past Medical History  Diagnosis Date  . Seasonal allergies   . Lymphedema of arm     right; no BP or puncture to right arm  . Depression   . Anxiety   . Sinus headache   . Hypertension     under control with med., has been on med. x 2 yr.  . History of breast cancer 2011    right  . History of chemotherapy 2011  . History of radiation therapy 2011    Past Surgical History  Procedure Laterality Date  . Supra-umbilical hernia  6962  . Nasal septum surgery    . Latissimus flap to breast Right 03/12/2013    Procedure: RIGHT BREAST LATISSIMUS FLAP WITH EXPANDER PLACEMENT;  Surgeon: Theodoro Kos, DO;  Location: [facility];  Service: Plastics;  Laterality: Right;  . Cesarean section  07/15/2001; 03/18/2004; 11/21/2008  . Tubal ligation  11/21/2008  . Portacath placement Left 09/12/2009  . Modified radical mastectomy Right 03/11/2010  . Port-a-cath removal Left 12/01/2010  . Removal of tissue expander and placement of implant Right 08/23/2013    Procedure: REMOVAL RIGHT TISSUE EXPANDER AND PLACEMENT OF IMPLANT TO RIGHT BREAST ;  Surgeon: Theodoro Kos, DO;  Location: [facility];  Service: Plastics;  Laterality: Right;  . Breast reduction surgery Left 08/23/2013    Procedure: LEFT BREAST REDUCTION  ;  Surgeon: Theodoro Kos, DO;  Location: [facility];  Service: Plastics;  Laterality: Left;  . Liposuction Bilateral 08/23/2013    Procedure:  LIPOSUCTION;  Surgeon: Theodoro Kos, DO;  Location: [facility] [place];  Service: Plastics;  Laterality: Bilateral;    There were no vitals taken for this visit.  Visit Diagnosis:  Lymphedema of upper extremity following lymphadenectomy  Stiffness of joint, shoulder region, right  Right-sided back pain, unspecified location      Subjective Assessment - 05/22/14 1205    Currently in Pain? Yes   Pain Location Shoulder   Pain Orientation Right     Treatment: Manual lymph drainage in supine as follows: short neck, left axillary nodes, right inguinal nodes, superficial and deep abdominals; anterior inter-axillary anastamoses, right axillo-inguinal anastamoses. Right upper extremity from fingers and dorsal hand to lateral shoulder redirecting along pathways. Lotion applied, thin stockinette elastomull to fingers 1-4, 2 artiflex, 3 short stretch bandages, thick stockinette to upper arm, one more short stretch bandage to upper arm.      Plan - 05/22/14 1211    Clinical Impression Statement Pt is ready to be measured for compression garments, but had has not been able to wear her old compression sleeve because she does not have her glove. Contacted [facility] and patient can be measured tomorrow, so she was rewrapped today to prepare for that   PT Next Visit Plan Teach Strength ABC program and logging  Make sure patient is using old compression sleeve and glove  until new one arrives            [place] - 05/22/14 1120    CC Long Term Goal  #1   Title Pt. will report at least 50% decrease in right thoracic and lumbar back pain.   Time 4   Period Weeks   Status On-going   CC Long Term Goal  #2   Title Pt. will be knowledgeable about obtaining new compression sleeve and glove, and process for that will be started.   Period Weeks   Status On-going  pt to be measured 05/23/2014   CC Long Term Goal  #3   Title Right arm circumference at 15 cm. proximal to  olecranon will be reduced at least 0.5 cm.   Time 4   Period Days   Status Achieved         Problem List Patient Active Problem List   Diagnosis Date Noted  . Myalgia and myositis 05/17/2014  . Neoplasm related pain 03/12/2014  . CN (constipation) 11/26/2013  . H/O reduction mammoplasty 10/30/2013  . Seizure 10/06/2013  . Status post bilateral breast implants 08/31/2013  . Acquired absence of breast and absent nipple 08/23/2013  . S/P breast reconstruction, right 08/23/2013  . AC joint arthropathy 07/02/2013  . Routine adult health maintenance 05/29/2013  . Hypokalemia 05/21/2013  . Chronic pain 04/19/2013  . Elevated blood sugar 03/09/2013  . Wheezing 02/22/2013  . Acute bronchitis 02/22/2013  . Lymphedema 05/24/2011  . Breast cancer 08/19/2010  . Breast cancer, female 08/19/2010  . RHINOSINUSITIS, CHRONIC 04/08/2010  . OBESITY, NOS 08/04/2006  . DEPRESSIVE DISORDER, NOS 08/04/2006  . RHINITIS, ALLERGIC 08/04/2006   Donato Heinz. Owens Shark, PT  [place]

## 2014-05-23 ENCOUNTER — Telehealth: Payer: Self-pay | Admitting: Oncology

## 2014-05-23 ENCOUNTER — Ambulatory Visit (HOSPITAL_BASED_OUTPATIENT_CLINIC_OR_DEPARTMENT_OTHER): Payer: Medicare Other | Admitting: Oncology

## 2014-05-23 VITALS — BP 126/86 | HR 86 | Temp 98.3°F | Resp 18 | Ht 64.0 in | Wt 272.0 lb

## 2014-05-23 DIAGNOSIS — R739 Hyperglycemia, unspecified: Secondary | ICD-10-CM

## 2014-05-23 DIAGNOSIS — C50911 Malignant neoplasm of unspecified site of right female breast: Secondary | ICD-10-CM

## 2014-05-23 DIAGNOSIS — I890 Lymphedema, not elsewhere classified: Secondary | ICD-10-CM

## 2014-05-23 DIAGNOSIS — E669 Obesity, unspecified: Secondary | ICD-10-CM

## 2014-05-23 DIAGNOSIS — Z79810 Long term (current) use of selective estrogen receptor modulators (SERMs): Secondary | ICD-10-CM

## 2014-05-23 NOTE — Telephone Encounter (Signed)
, °

## 2014-05-23 NOTE — Progress Notes (Signed)
ID: Karen Hopkins Nodal   DOB: [birth year]  MR#: [id number]  BEM#:[id number]  PCP: Chrisandra Netters, MD GYN: SU: Osborn Coho, MD;  Theodoro Kos, MD OTHER MD: Alysia Penna, MD;  Melrose Nakayama, MD  CHIEF COMPLAINT:  Locally advanced right breast cancer  CURRENT TREATMENT: Tamoxifen  HISTORY OF PRESENT ILLNESS: From the original intake note:  The patient noted swelling and redness, as well as some tenderness, over her right breast for some time. She thought this might be related to pregnancy, since she delivered her third child about 8 months ago. As the problem did not improve, she brought it to her primary care physician's attention. He treated her with Bactrim, which she says she could not tolerate because of nausea and vomiting, but which in any case did not resolve the problem, so he set her up for mammography at [facility] 08/26/2009. Dr. Sadie Haber found by exam marked skin thickening and erythema of the right breast extending pretty much throughout the breast. By mammography there were scattered fibroglandular densities, and an asymmetric density in the right upper outer quadrant with suspicious microcalcifications. There were also enlarged right axillary lymph nodes. The left appeared normal. By ultrasound there was an ill defined area of hypoechoic tissue in the right upper outer quadrant that was difficult to measure. There were also abnormal right axillary lymph nodes.  Ultrasound guided biopsy was performed the same day, and showed [id number] a poorly differentiated invasive ductal carcinoma, which was estrogen receptor poor at 3%, progesterone receptor positive at 14% with an elevated proliferation marker at 50%, but importantly with strong amplification of HER-2 by CISH with a ratio of 5.20.  Bilateral breast MRIs were obtained 09/02/2009. This showed an area up to 10.6 cm in the right breast showing confluent mass and non-mass enhancement. There was marked skin thickening  involving the entire right breast, and numerous bulky right axillary lymph nodes were identified. The left breast was unremarkable, and there was no evidence of internal mammary lymph node involvement.  Patient was treated in the neoadjuvant setting with 4 dose dense cycles of doxorubicin and cyclophosphamide, followed by 12 weekly doses of paclitaxel and trastuzumab. Munira underwent definitive right modified radical mastectomy in October 2011, with a residual 2.5 mm area of tumor in the breast, grade 3, and one of 21 lymph nodes involved.   Her subsequent history is as detailed below  INTERVAL HISTORY: Karen Hopkins is here today For follow-up of her breast cancer. She continues on tamoxifen. She hates the medication, she says in she certainly does have problems with hot flashes and to some extent vaginal wetness from it. She also blames other, more classically menopausal symptoms, on tamoxifen, including the fact that she cannot lose any weight and has problems sleeping.  REVIEW OF SYSTEMS: She describes herself as severely fatigued. She has chronic pain involving the shoulder, back, right axilla, and other joints. She is going to the pain clinic and has tried a variety of interventions but the one that has worked the best for her was a recent course of acupuncture. She tells me that gave her almost complete relief in the right shoulder for about 2 days. She's also going to the lymphedema clinic to work to reduce the right upper extremity lymphedema, which isdisabling. She sleeps poorly, has problems with sinus issues, is deconditioned and is short of breath when walking up any distance or particularly whenwalking up stairs. Some days she just can't get out of bed because she feels so bad. She  is not exercising regularly. She is doing "the best I can" on her diet. She tells me her sugars are "okay". She is not entirely satisfied with her reconstruction, but understands a less she loses some more weight she  cannot have further left breast reduction area did she has pain in the right latissimus flap at times. A detailed review of systems was otherwise stable.      PAST MEDICAL HISTORY: Past Medical History  Diagnosis Date  . Seasonal allergies   . Lymphedema of arm     right; no BP or puncture to right arm  . Depression   . Anxiety   . Sinus headache   . Hypertension     under control with med., has been on med. x 2 yr.  . History of breast cancer 2011    right  . History of chemotherapy 2011  . History of radiation therapy 2011    PAST SURGICAL HISTORY: Past Surgical History  Procedure Laterality Date  . Supra-umbilical hernia  7262  . Nasal septum surgery    . Latissimus flap to breast Right 03/12/2013    Procedure: RIGHT BREAST LATISSIMUS FLAP WITH EXPANDER PLACEMENT;  Surgeon: Theodoro Kos, DO;  Location: [facility];  Service: Plastics;  Laterality: Right;  . Cesarean section  07/15/2001; 03/18/2004; 11/21/2008  . Tubal ligation  11/21/2008  . Portacath placement Left 09/12/2009  . Modified radical mastectomy Right 03/11/2010  . Port-a-cath removal Left 12/01/2010  . Removal of tissue expander and placement of implant Right 08/23/2013    Procedure: REMOVAL RIGHT TISSUE EXPANDER AND PLACEMENT OF IMPLANT TO RIGHT BREAST ;  Surgeon: Theodoro Kos, DO;  Location: [facility];  Service: Plastics;  Laterality: Right;  . Breast reduction surgery Left 08/23/2013    Procedure: LEFT BREAST REDUCTION  ;  Surgeon: Theodoro Kos, DO;  Location: [facility];  Service: Plastics;  Laterality: Left;  . Liposuction Bilateral 08/23/2013    Procedure: LIPOSUCTION;  Surgeon: Theodoro Kos, DO;  Location: [facility];  Service: Plastics;  Laterality: Bilateral;    FAMILY HISTORY Family History  Problem Relation Age of Onset  . Hypertension Mother   . Diabetes type II Father   . Prostate cancer Father   There is no history of breast or ovarian cancer in the  immediate family, but of the patient's mother's mother's six sisters, two (the patient's great-aunts) had ovarian cancer.  GYNECOLOGIC HISTORY:   (Updated 11/26/2013) The patient is GX, P3. First child was premature. Age at first delivery, 38 years old.   she status post tubal ligation. She continues to have menstrual cycles, although irregularly, with last menstrual cycle in May 2015.   SOCIAL HISTORY: (Updated 11/26/2013) She is currently disabled. Shiya's children are [name], and [name]. They are 11, 10 and 5 as of DEC 2015. They are all boys, all at home. The patient attends a non-denominational church in [place], which she considers her home, but she is currently living in [place].   ADVANCED DIRECTIVES: not in place  HEALTH MAINTENANCE:  (Updated 11/26/2013) History  Substance Use Topics  . Smoking status: Former Smoker -- 0.25 packs/day for 5 years    Quit date: 04/26/2010  . Smokeless tobacco: Never Used  . Alcohol Use: No    Colonoscopy: Never  PAP: April 2015  Bone density: Never  Lipid panel: August 2014    Allergies  Allergen Reactions  . Lyrica [Pregabalin] Nausea Only    .  Marland Kitchen Meloxicam  Nausea Only  . Penicillins Swelling    FACIAL SWELLING  . Robaxin [Methocarbamol] Nausea Only    Current Outpatient Prescriptions  Medication Sig Dispense Refill  . ALPRAZolam (ALPRAZOLAM XR) 1 MG 24 hr tablet TAKE ONE (1) TABLET BY MOUTH EVERY DAY 30 tablet 1  . citalopram (CELEXA) 20 MG tablet TAKE ONE (1) TABLET BY MOUTH EVERY DAY 30 tablet 5  . diclofenac sodium (VOLTAREN) 1 % GEL Apply 2 g topically 4 (four) times daily. (Patient not taking: Reported on 05/08/2014) 2 Tube 2  . metFORMIN (GLUCOPHAGE) 500 MG tablet Take 1 tablet (500 mg total) by mouth daily with breakfast. (Patient not taking: Reported on 05/08/2014) 30 tablet 3  . naproxen (NAPROSYN) 500 MG tablet Take 1 tablet (500 mg total) by mouth 3 (three) times daily as needed. headache 90 tablet 0  .  oxyCODONE (OXY IR/ROXICODONE) 5 MG immediate release tablet Take 1 tablet (5 mg total) by mouth every 6 (six) hours as needed for severe pain. 120 tablet 0  . potassium chloride (MICRO-K) 10 MEQ CR capsule Take 1 capsule (10 mEq total) by mouth 2 (two) times daily. 180 capsule 3  . tamoxifen (NOLVADEX) 20 MG tablet Take 1 tablet (20 mg total) by mouth daily. 90 tablet 3  . tiZANidine (ZANAFLEX) 4 MG tablet Take 1 tablet (4 mg total) by mouth 3 (three) times daily. 90 tablet 2  . triamterene-hydrochlorothiazide (MAXZIDE-25) 37.5-25 MG per tablet TAKE ONE (1) TABLET BY MOUTH EVERY DAY 30 tablet 4   No current facility-administered medications for this visit.    OBJECTIVE: Young African American woman who appears stated age  38 Vitals:   05/23/14 1216  BP: 126/86  Pulse: 86  Temp: 98.3 F (36.8 C)  Resp: 18     Body mass index is 46.67 kg/(m^2).    ECOG FS: 2   Filed Weights   05/23/14 1216  Weight: 272 lb (123.378 kg)   Physical Exam: GENERAL: Patient is a well appearing female in no acute distress HEENT:  Sclerae anicteric.  Oropharynx clear and moist. No ulcerations or evidence of oropharyngeal candidiasis. Neck is supple.  NODES:  No cervical, supraclavicular, or axillary lymphadenopathy palpated.  BREAST EXAM:  Left breast skin dimpling LUNGS:  Clear to auscultation bilaterally.  No wheezes or rhonchi. HEART:  Regular rate and rhythm. No murmur appreciated. ABDOMEN:  Soft, nontender.  Positive, normoactive bowel sounds. No organomegaly palpated. MSK:  No focal spinal tenderness to palpation. Full range of motion bilaterally in the upper extremities. EXTREMITIES:  No peripheral edema.   SKIN:  Clear with no obvious rashes or skin changes. No nail dyscrasia. NEURO:  Nonfocal. Well oriented.  Appropriate affect.       LAB RESULTS: Lab Results  Component Value Date   WBC 11.6* 05/21/2014   NEUTROABS 6.5 05/21/2014   HGB 12.2 05/21/2014   HCT 37.5 05/21/2014   MCV  87.1 05/21/2014   PLT 280 05/21/2014      Chemistry      Component Value Date/Time   NA 138 05/21/2014 1517   NA 139 08/20/2013 1155   K 3.4* 05/21/2014 1517   K 3.6* 08/20/2013 1155   CL 100 08/20/2013 1155   CL 105 09/27/2012 1044   CO2 23 05/21/2014 1517   CO2 26 08/20/2013 1155   BUN 14.3 05/21/2014 1517   BUN 10 08/20/2013 1155   CREATININE 0.8 05/21/2014 1517   CREATININE 0.67 08/20/2013 1155      Component Value Date/Time  CALCIUM 9.1 05/21/2014 1517   CALCIUM 9.5 08/20/2013 1155   ALKPHOS 73 05/21/2014 1517   ALKPHOS 65 11/24/2011 0903   AST 13 05/21/2014 1517   AST 13 11/24/2011 0903   ALT 15 05/21/2014 1517   ALT 13 11/24/2011 0903   BILITOT 0.20 05/21/2014 1517   BILITOT 0.1* 11/24/2011 0903          ASSESSMENT: 38 y.o.  BRCA1 and BRCA2 negative [place] woman, status post right breast biopsy in March 2011 for a high-grade invasive ductal carcinoma, ER positive 3%, PR positive 14%, strongly HER2/neu positive with MIB-1 of 15%. Biopsy proven lymph node involvement at presentation. Changes consistent with inflammatory carcinoma.   1. Treated neoadjuvantly, status post 4 dose-dense cycles of doxorubicin/cyclophosphamide, followed by 12 weekly doses of paclitaxel with trastuzumab completed September 2011.   2. Trastuzumab was then continued for a total of 1 year [to June 2012]. Post-treatment echo showed a well-preserved ejection fraction.   3. Status post right modified radical mastectomy in October 2011 showing a ypT1a ypN1 invasive ductal carcinoma, grade 3.  Status post right reconstruction with implant, March 2015   4. postmastectomy radiation completed in January 2012  5. on tamoxifen beginning January of 2012  6. other problems include chronic postsurgical pain, chronic right upper extremity lymphedema, and chronic depression  PLAN: Pamalee is doing moderately well today.  She anxious and rightly so due to her left breast skin changes.  I gave her  reassurance and ordered a left breast diagnostic mammogram and ultrasound.  We spent a great deal of time discussing the anxiety surrounded by this for her.  We will await those results.    I spent 25 minutes counseling the patient face to face.  The total time spent in the appointment was 30 minutes.  Chauncey Cruel, MD      05/23/2014  ID: Karen Hopkins   DOB: 1975-09-08  MR#: [id number]  LPF#:[id number]  PCP: Chrisandra Netters, MD GYN: SU: Osborn Coho, MD;  Theodoro Kos, MD OTHER MD: Alysia Penna, MD;  Melrose Nakayama, MD  CHIEF COMPLAINT:  Hx of Right Breast Cancer (tamoxifen)   HISTORY OF PRESENT ILLNESS: The patient noted swelling and redness, as well as some tenderness, over her right breast for some time. She thought this might be related to pregnancy, since she delivered her third child about 8 months ago. As the problem did not improve, she brought it to her primary care physician's attention. He treated her with Bactrim, which she says she could not tolerate because of nausea and vomiting, but which in any case did not resolve the problem, so he set her up for mammography at [facility] 08/26/2009. Dr. Sadie Haber found by exam marked skin thickening and erythema of the right breast extending pretty much throughout the breast. By mammography there were scattered fibroglandular densities, and an asymmetric density in the right upper outer quadrant with suspicious microcalcifications. There were also enlarged right axillary lymph nodes. The left appeared normal. By ultrasound there was an ill defined area of hypoechoic tissue in the right upper outer quadrant that was difficult to measure. There were also abnormal right axillary lymph nodes.  Ultrasound guided biopsy was performed the same day, and showed [phone]) a poorly differentiated invasive ductal carcinoma, which was estrogen receptor poor at 3%, progesterone receptor positive at 14% with an elevated proliferation  marker at 50%, but importantly with strong amplification of HER-2 by CISH with a ratio of 5.20.  Bilateral breast MRIs were  obtained 09/02/2009. This showed an area up to 10.6 cm in the right breast showing confluent mass and non-mass enhancement. There was marked skin thickening involving the entire right breast, and numerous bulky right axillary lymph nodes were identified. The left breast was unremarkable, and there was no evidence of internal mammary lymph node involvement.  Patient was treated in the neoadjuvant setting with 4 dose dense cycles of doxorubicin and cyclophosphamide, followed by 12 weekly doses of paclitaxel and trastuzumab. Daizha underwent definitive right modified radical mastectomy in October 2011, with a residual 2.5 mm area of tumor in the breast, grade 3, and one of 21 lymph nodes involved.   Her subsequent history is as detailed below  INTERVAL HISTORY: Hailly is here today alone for an urgent visit regarding some skin dimpling that developed in her left breast.  We reviewed her h/o right breast cancer.  Her last mammo was in 10/2012 of her left breast and was normal.  A repeat was not recommended due to the patient's age.  She now has left breast skin dimpling that started last week.  She has had a mammoplasty on that breast.  She has pain everywhere, but this is chronic and she goes to the pain clinic for this.    REVIEW OF SYSTEMS: A 10 point review of systems was conducted and is otherwise negative except for what is noted above.        PAST MEDICAL HISTORY: Past Medical History  Diagnosis Date  . Seasonal allergies   . Lymphedema of arm     right; no BP or puncture to right arm  . Depression   . Anxiety   . Sinus headache   . Hypertension     under control with med., has been on med. x 2 yr.  . History of breast cancer 2011    right  . History of chemotherapy 2011  . History of radiation therapy 2011    PAST SURGICAL HISTORY: Past Surgical History   Procedure Laterality Date  . Supra-umbilical hernia  3846  . Nasal septum surgery    . Latissimus flap to breast Right 03/12/2013    Procedure: RIGHT BREAST LATISSIMUS FLAP WITH EXPANDER PLACEMENT;  Surgeon: Theodoro Kos, DO;  Location: [facility];  Service: Plastics;  Laterality: Right;  . Cesarean section  07/15/2001; 03/18/2004; 11/21/2008  . Tubal ligation  11/21/2008  . Portacath placement Left 09/12/2009  . Modified radical mastectomy Right 03/11/2010  . Port-a-cath removal Left 12/01/2010  . Removal of tissue expander and placement of implant Right 08/23/2013    Procedure: REMOVAL RIGHT TISSUE EXPANDER AND PLACEMENT OF IMPLANT TO RIGHT BREAST ;  Surgeon: Theodoro Kos, DO;  Location: [facility];  Service: Plastics;  Laterality: Right;  . Breast reduction surgery Left 08/23/2013    Procedure: LEFT BREAST REDUCTION  ;  Surgeon: Theodoro Kos, DO;  Location: [facility];  Service: Plastics;  Laterality: Left;  . Liposuction Bilateral 08/23/2013    Procedure: LIPOSUCTION;  Surgeon: Theodoro Kos, DO;  Location: [facility];  Service: Plastics;  Laterality: Bilateral;    FAMILY HISTORY Family History  Problem Relation Age of Onset  . Hypertension Mother   . Diabetes type II Father   . Prostate cancer Father   There is no history of breast or ovarian cancer in the immediate family, but of the patient's mother's mother's six sisters, two (the patient's great-aunts) had ovarian cancer.  GYNECOLOGIC HISTORY:   (Updated 11/26/2013) The patient is  GX, P3. First child was premature. Age at first delivery, 38 years old.   she status post tubal ligation. She continues to have menstrual cycles, although irregularly, with last menstrual cycle in May 2015.   SOCIAL HISTORY: (Updated 11/26/2013) She is currently disabled. Conita's children are [name], and [name]. They are all boys, all at home. The patient attends a non-denominational church in [place],  which she considers her home, but she is currently living in [place].   ADVANCED DIRECTIVES:  HEALTH MAINTENANCE:  (Updated 11/26/2013) History  Substance Use Topics  . Smoking status: Former Smoker -- 0.25 packs/day for 5 years    Quit date: 04/26/2010  . Smokeless tobacco: Never Used  . Alcohol Use: No    Colonoscopy: Never  PAP: April 2015  Bone density: Never  Lipid panel: August 2014    Allergies  Allergen Reactions  . Lyrica [Pregabalin] Nausea Only    .  Marland Kitchen Meloxicam Nausea Only  . Penicillins Swelling    FACIAL SWELLING  . Robaxin [Methocarbamol] Nausea Only    Current Outpatient Prescriptions  Medication Sig Dispense Refill  . ALPRAZolam (ALPRAZOLAM XR) 1 MG 24 hr tablet TAKE ONE (1) TABLET BY MOUTH EVERY DAY 30 tablet 1  . citalopram (CELEXA) 20 MG tablet TAKE ONE (1) TABLET BY MOUTH EVERY DAY 30 tablet 5  . diclofenac sodium (VOLTAREN) 1 % GEL Apply 2 g topically 4 (four) times daily. (Patient not taking: Reported on 05/08/2014) 2 Tube 2  . metFORMIN (GLUCOPHAGE) 500 MG tablet Take 1 tablet (500 mg total) by mouth daily with breakfast. (Patient not taking: Reported on 05/08/2014) 30 tablet 3  . naproxen (NAPROSYN) 500 MG tablet Take 1 tablet (500 mg total) by mouth 3 (three) times daily as needed. headache 90 tablet 0  . oxyCODONE (OXY IR/ROXICODONE) 5 MG immediate release tablet Take 1 tablet (5 mg total) by mouth every 6 (six) hours as needed for severe pain. 120 tablet 0  . potassium chloride (MICRO-K) 10 MEQ CR capsule Take 1 capsule (10 mEq total) by mouth 2 (two) times daily. 180 capsule 3  . tamoxifen (NOLVADEX) 20 MG tablet Take 1 tablet (20 mg total) by mouth daily. 90 tablet 3  . tiZANidine (ZANAFLEX) 4 MG tablet Take 1 tablet (4 mg total) by mouth 3 (three) times daily. 90 tablet 2  . triamterene-hydrochlorothiazide (MAXZIDE-25) 37.5-25 MG per tablet TAKE ONE (1) TABLET BY MOUTH EVERY DAY 30 tablet 4   No current facility-administered medications for this  visit.    OBJECTIVE: Young [place] American woman who appears tired but is in no acute distress Filed Vitals:   05/23/14 1216  BP: 126/86  Pulse: 86  Temp: 98.3 F (36.8 C)  Resp: 18     Body mass index is 46.67 kg/(m^2).    ECOG FS: 2   Filed Weights   05/23/14 1216  Weight: 272 lb (123.378 kg)   Physical Exam: GENERAL: Patient is a well appearing female in no acute distress HEENT:  Sclerae anicteric.  Oropharynx clear and moist. No ulcerations or evidence of oropharyngeal candidiasis. Neck is supple.  NODES:  No cervical, supraclavicular, or axillary lymphadenopathy palpated.  BREAST EXAM:  Left breast skin dimpling LUNGS:  Clear to auscultation bilaterally.  No wheezes or rhonchi. HEART:  Regular rate and rhythm. No murmur appreciated. ABDOMEN:  Soft, nontender.  Positive, normoactive bowel sounds. No organomegaly palpated. MSK:  No focal spinal tenderness to palpation. Full range of motion bilaterally in the upper extremities. EXTREMITIES:  No peripheral edema.   SKIN:  Clear with no obvious rashes or skin changes. No nail dyscrasia. NEURO:  Nonfocal. Well oriented.  Appropriate affect.       LAB RESULTS: Lab Results  Component Value Date   WBC 11.6* 05/21/2014   NEUTROABS 6.5 05/21/2014   HGB 12.2 05/21/2014   HCT 37.5 05/21/2014   MCV 87.1 05/21/2014   PLT 280 05/21/2014      Chemistry      Component Value Date/Time   NA 138 05/21/2014 1517   NA 139 08/20/2013 1155   K 3.4* 05/21/2014 1517   K 3.6* 08/20/2013 1155   CL 100 08/20/2013 1155   CL 105 09/27/2012 1044   CO2 23 05/21/2014 1517   CO2 26 08/20/2013 1155   BUN 14.3 05/21/2014 1517   BUN 10 08/20/2013 1155   CREATININE 0.8 05/21/2014 1517   CREATININE 0.67 08/20/2013 1155      Component Value Date/Time   CALCIUM 9.1 05/21/2014 1517   CALCIUM 9.5 08/20/2013 1155   ALKPHOS 73 05/21/2014 1517   ALKPHOS 65 11/24/2011 0903   AST 13 05/21/2014 1517   AST 13 11/24/2011 0903   ALT 15  05/21/2014 1517   ALT 13 11/24/2011 0903   BILITOT 0.20 05/21/2014 1517   BILITOT 0.1* 11/24/2011 0903          ASSESSMENT: 38 y.o.  BRCA1 and BRCA2 negative Karen Hopkins woman, status post right breast biopsy in March 2011 for a clinical T3 N2, stage IIIA invasive ductal carcinoma, high-grade, estrogen receptor positive at3%, progesterone receptor positiveat 14%, strongly HER2/neu positive,  with MIB-1 of 15%.   (a) Biopsy proven lymph node involvement at presentation.  (b) Skin changes consistent with inflammatory carcinoma   1. Treated neoadjuvantly with 4 dose-dense cycles of doxorubicin/cyclophosphamide, followed by 12 weekly doses of paclitaxelgiven with trastuzumab, completed September 2011.   2. Trastuzumab was  continued for a total of 1 year [to June 2012]. Post-treatment echo showed a well-preserved ejection fraction.   3. Status post right modified radical mastectomy in October 2011 showing a ypT1a ypN1 invasive ductal carcinoma, grade 3.    (a) Status post right implant reconstruction March 2015, with left reduction mammoplasty  4. postmastectomy radiation completed in January 2012  5. on tamoxifen beginning January of 2012  6. other problems include chronic postsurgical pain, chronic right upper extremity lymphedema, and chronic depression  PLAN: Lauralye looks well and there is no evidence of recurrent or active disease now 4 years out from her definitive surgery. She continues to struggle with depression, chronic pain, morbid obesity, and lymphedema. This is an top of severe economic stressors. From a breast cancer point of view though, she continues to do remarkably well. She quotes hates tamoxifen", and blames it for several problems which are really related to menopause, such as for example weight gain and insomnia. We went over the fact that we have good placebo-controlled data that shows tamoxifen does not cause these issues. She is willing however to continue on the  medication.  Note that given recent period we are clearly not ready to switch to an aromatase inhibitor at this point  We also discussed exercise issues. She did go through the [employer] before. I do not know if she can repeat that. She is making progress on her right upper extremity lymphedema. She is benefiting from Dr. Dianna Limbo interventions regarding her chronic pain and particularly hopes to be able to continue to receive acupuncture.  She will see  me again in April 2016. She knows to call for any problems that may develop before that visit.     05/24/2014

## 2014-05-24 DIAGNOSIS — Z170 Estrogen receptor positive status [ER+]: Secondary | ICD-10-CM

## 2014-05-24 DIAGNOSIS — C50811 Malignant neoplasm of overlapping sites of right female breast: Secondary | ICD-10-CM | POA: Insufficient documentation

## 2014-05-24 NOTE — Addendum Note (Signed)
Addended by: Laureen Abrahams on: 05/24/2014 05:26 PM   Modules accepted: Orders, Medications

## 2014-05-28 ENCOUNTER — Ambulatory Visit: Payer: Medicare Other | Admitting: Physical Therapy

## 2014-05-28 DIAGNOSIS — M25611 Stiffness of right shoulder, not elsewhere classified: Secondary | ICD-10-CM

## 2014-05-28 DIAGNOSIS — M5489 Other dorsalgia: Secondary | ICD-10-CM

## 2014-05-28 DIAGNOSIS — E8989 Other postprocedural endocrine and metabolic complications and disorders: Secondary | ICD-10-CM | POA: Diagnosis not present

## 2014-05-28 NOTE — Therapy (Signed)
[place], [place], [address] Phone: [phone]   Fax:  [phone]  Physical Therapy Treatment  Patient Details  Name: Karen Hopkins MRN: [id number] Date of Birth: [birth year]  Encounter Date: 05/28/2014      PT End of Session - 05/28/14 1735    Visit Number 8   Number of Visits 9   Date for PT Re-Evaluation 05/29/14   PT Start Time 4132   PT Stop Time 1602   PT Time Calculation (min) 39 min   Activity Tolerance Patient limited by fatigue      Past Medical History  Diagnosis Date  . Seasonal allergies   . Lymphedema of arm     right; no BP or puncture to right arm  . Depression   . Anxiety   . Sinus headache   . Hypertension     under control with med., has been on med. x 2 yr.  . History of breast cancer 2011    right  . History of chemotherapy 2011  . History of radiation therapy 2011    Past Surgical History  Procedure Laterality Date  . Supra-umbilical hernia  4401  . Nasal septum surgery    . Latissimus flap to breast Right 03/12/2013    Procedure: RIGHT BREAST LATISSIMUS FLAP WITH EXPANDER PLACEMENT;  Surgeon: Theodoro Kos, DO;  Location: [facility];  Service: Plastics;  Laterality: Right;  . Cesarean section  07/15/2001; 03/18/2004; 11/21/2008  . Tubal ligation  11/21/2008  . Portacath placement Left 09/12/2009  . Modified radical mastectomy Right 03/11/2010  . Port-a-cath removal Left 12/01/2010  . Removal of tissue expander and placement of implant Right 08/23/2013    Procedure: REMOVAL RIGHT TISSUE EXPANDER AND PLACEMENT OF IMPLANT TO RIGHT BREAST ;  Surgeon: Theodoro Kos, DO;  Location: [facility];  Service: Plastics;  Laterality: Right;  . Breast reduction surgery Left 08/23/2013    Procedure: LEFT BREAST REDUCTION  ;  Surgeon: Theodoro Kos, DO;  Location: [facility];  Service: Plastics;  Laterality: Left;  . Liposuction Bilateral 08/23/2013   Procedure: LIPOSUCTION;  Surgeon: Theodoro Kos, DO;  Location: [facility];  Service: Plastics;  Laterality: Bilateral;    There were no vitals taken for this visit.  Visit Diagnosis:  Stiffness of joint, shoulder region, right  Right-sided back pain, unspecified location      Subjective Assessment - 05/28/14 1524    Symptoms Tired today.  Woke up in the middle of the night and couldn't fall back asleep.  Back was hurting.   Currently in Pain? Yes   Pain Score 7    Pain Location Back   Pain Orientation Right   Aggravating Factors  weather?   Pain Relieving Factors pain meds and sleep                    [facility] Adult PT Treatment/Exercise - 05/28/14 0001    Exercises   Exercises Shoulder   Lumbar Exercises: Supine   Other Supine Lumbar Exercises quadruped opposite arm.leg raise; abdominal curls; bridging x 10 ea   Shoulder Exercises: Standing   Other Standing Exercises strength ABC stretches including chest, shoulder, tricep, calf, quads, butterfly, hamstring, and trunk rotation x 15 seconds each                PT Education - 05/28/14 1731    Education provided Yes   Education Details intro to strength ABC program; stretching  portion of this program, and core strengthening section   [place](s) Educated Patient   Methods Explanation;Demonstration;Tactile cues;Verbal cues;Handout   Comprehension Verbalized understanding;Returned demonstration                [facility] Goals - 05/28/14 1738    CC Long Term Goal  #2   Status Achieved            Plan - 05/28/14 1736    Clinical Impression Statement Pt. reported fatigue today, and was challenged by strength ABC program instruction; got through stretching and core strengthening portions + instruction in using log   Pt will benefit from skilled therapeutic intervention in order to improve on the following deficits Decreased activity tolerance;Decreased endurance;Decreased  strength;Decreased range of motion;Pain   Rehab Potential Good   PT Next Visit Plan Needs to review/continue with strength ABC program instruction.  Assess--either to discharge next or will need renewal.   PT Home Exercise Plan Perform strength ABC program one time before next visit (pt. plans to do it Christmas day)   Consulted and Agree with Plan of Care Patient        Problem List Patient Active Problem List   Diagnosis Date Noted  . Breast cancer, right breast 05/24/2014  . Myalgia and myositis 05/17/2014  . Neoplasm related pain 03/12/2014  . CN (constipation) 11/26/2013  . H/O reduction mammoplasty 10/30/2013  . Seizure 10/06/2013  . Status post bilateral breast implants 08/31/2013  . Acquired absence of breast and absent nipple 08/23/2013  . S/P breast reconstruction, right 08/23/2013  . AC joint arthropathy 07/02/2013  . Routine adult health maintenance 05/29/2013  . Hypokalemia 05/21/2013  . Chronic pain 04/19/2013  . Elevated blood sugar 03/09/2013  . Wheezing 02/22/2013  . Acute bronchitis 02/22/2013  . Lymphedema 05/24/2011  . RHINOSINUSITIS, CHRONIC 04/08/2010  . Obesity 08/04/2006  . DEPRESSIVE DISORDER, NOS 08/04/2006  . RHINITIS, ALLERGIC 08/04/2006    SALISBURY,DONNA 05/28/2014, 5:39 PM  [place], [place], [address] Phone: [phone]   Fax:  [phone]  Serafina Royals, [place]

## 2014-06-03 ENCOUNTER — Ambulatory Visit: Payer: Medicare Other | Admitting: Physical Therapy

## 2014-06-04 ENCOUNTER — Ambulatory Visit: Payer: Medicare Other

## 2014-06-04 DIAGNOSIS — M25611 Stiffness of right shoulder, not elsewhere classified: Secondary | ICD-10-CM

## 2014-06-04 DIAGNOSIS — E8989 Other postprocedural endocrine and metabolic complications and disorders: Secondary | ICD-10-CM | POA: Diagnosis not present

## 2014-06-04 DIAGNOSIS — I890 Lymphedema, not elsewhere classified: Secondary | ICD-10-CM

## 2014-06-04 DIAGNOSIS — M5489 Other dorsalgia: Secondary | ICD-10-CM

## 2014-06-04 NOTE — Therapy (Signed)
[place], [place], [address] Phone: [phone]   Fax:  [phone]  Physical Therapy Treatment  Patient Details  Name: Karen Hopkins MRN: [id number] Date of Birth: 26-Mar-1976  Encounter Date: 06/04/2014      PT End of Session - 06/04/14 1107    Visit Number 9   Number of Visits 9   Date for PT Re-Evaluation 05/29/14   PT Start Time 1019   PT Stop Time 1105   PT Time Calculation (min) 46 min      Past Medical History  Diagnosis Date  . Seasonal allergies   . Lymphedema of arm     right; no BP or puncture to right arm  . Depression   . Anxiety   . Sinus headache   . Hypertension     under control with med., has been on med. x 2 yr.  . History of breast cancer 2011    right  . History of chemotherapy 2011  . History of radiation therapy 2011    Past Surgical History  Procedure Laterality Date  . Supra-umbilical hernia  8466  . Nasal septum surgery    . Latissimus flap to breast Right 03/12/2013    Procedure: RIGHT BREAST LATISSIMUS FLAP WITH EXPANDER PLACEMENT;  Surgeon: Theodoro Kos, DO;  Location: [facility];  Service: Plastics;  Laterality: Right;  . Cesarean section  07/15/2001; 03/18/2004; 11/21/2008  . Tubal ligation  11/21/2008  . Portacath placement Left 09/12/2009  . Modified radical mastectomy Right 03/11/2010  . Port-a-cath removal Left 12/01/2010  . Removal of tissue expander and placement of implant Right 08/23/2013    Procedure: REMOVAL RIGHT TISSUE EXPANDER AND PLACEMENT OF IMPLANT TO RIGHT BREAST ;  Surgeon: Theodoro Kos, DO;  Location: [facility];  Service: Plastics;  Laterality: Right;  . Breast reduction surgery Left 08/23/2013    Procedure: LEFT BREAST REDUCTION  ;  Surgeon: Theodoro Kos, DO;  Location: [facility];  Service: Plastics;  Laterality: Left;  . Liposuction Bilateral 08/23/2013    Procedure: LIPOSUCTION;  Surgeon: Theodoro Kos, DO;   Location: [facility];  Service: Plastics;  Laterality: Bilateral;    There were no vitals taken for this visit.  Visit Diagnosis:  Stiffness of joint, shoulder region, right  Right-sided back pain, unspecified location  Lymphedema of upper extremity following lymphadenectomy      Subjective Assessment - 06/04/14 1022    Symptoms Feel better today, did some of the stretches over the holiday.                    [facility] Adult PT Treatment/Exercise - 06/04/14 0001    Lumbar Exercises: Supine   Other Supine Lumbar Exercises quadruped opposite arm.leg raise; abdominal curls (reverse curl too challenging); bridging x 10 ea   Shoulder Exercises: ROM/Strengthening   Other ROM/Strengthening Exercises Supine chest press with 2#, squats, standing rows, hip abduction, scaption with 2#, bil 6" step ups, bil tricep kickbacks 2# with Lt UE, bil calf raises all 10 reps each. Pt required tactile cuing and demo for technique.   Shoulder Exercises: Stretch   Other Shoulder Stretches Stretches from [facility] Strength Program: Bil chest, shoulder, tricep, calf, quadriceps, seated piriformis figure 4 and hamstring stretches all 1 rep for 15 sec holds                PT Education - 06/04/14 1107    Education provided Yes  Education Details Nurse, children's Program    [place](s) Educated Patient   Methods Explanation;Demonstration;Handout;Tactile cues   Comprehension Verbalized understanding;Returned demonstration;Need further instruction;Verbal cues required;Tactile cues required                [facility] Goals - 05/28/14 1738    CC Long Term Goal  #2   Status Achieved            Plan - 06/04/14 1108    Clinical Impression Statement Pt felling better today and able to complete Strength AB Program, did well with mild SOB during, took rest breaks prn.   Pt will benefit from skilled therapeutic intervention in order to improve on the following  deficits Decreased activity tolerance;Decreased endurance;Decreased strength;Decreased range of motion;Pain   Rehab Potential Good   PT Next Visit Plan Needs to review/continue with strength ABC program instruction.  Assess--either to discharge next or will need renewal, but possible discharge next.        Problem List Patient Active Problem List   Diagnosis Date Noted  . Breast cancer, right breast 05/24/2014  . Myalgia and myositis 05/17/2014  . Neoplasm related pain 03/12/2014  . CN (constipation) 11/26/2013  . H/O reduction mammoplasty 10/30/2013  . Seizure 10/06/2013  . Status post bilateral breast implants 08/31/2013  . Acquired absence of breast and absent nipple 08/23/2013  . S/P breast reconstruction, right 08/23/2013  . AC joint arthropathy 07/02/2013  . Routine adult health maintenance 05/29/2013  . Hypokalemia 05/21/2013  . Chronic pain 04/19/2013  . Elevated blood sugar 03/09/2013  . Wheezing 02/22/2013  . Acute bronchitis 02/22/2013  . Lymphedema 05/24/2011  . RHINOSINUSITIS, CHRONIC 04/08/2010  . Obesity 08/04/2006  . DEPRESSIVE DISORDER, NOS 08/04/2006  . RHINITIS, ALLERGIC 08/04/2006    Otelia Limes, PTA 06/04/2014, 11:09 AM  [place], [place], [address] Phone: [phone]   Fax:  [phone]

## 2014-06-05 ENCOUNTER — Ambulatory Visit: Payer: Medicare Other

## 2014-06-10 ENCOUNTER — Ambulatory Visit: Payer: Medicare Other | Attending: Physical Medicine & Rehabilitation

## 2014-06-10 DIAGNOSIS — M5489 Other dorsalgia: Secondary | ICD-10-CM

## 2014-06-10 DIAGNOSIS — M25611 Stiffness of right shoulder, not elsewhere classified: Secondary | ICD-10-CM

## 2014-06-10 DIAGNOSIS — I890 Lymphedema, not elsewhere classified: Secondary | ICD-10-CM | POA: Insufficient documentation

## 2014-06-10 DIAGNOSIS — E8989 Other postprocedural endocrine and metabolic complications and disorders: Secondary | ICD-10-CM | POA: Insufficient documentation

## 2014-06-10 NOTE — Therapy (Addendum)
[place], [place], [address] Phone: [phone]   Fax:  [phone]  Physical Therapy Treatment  Patient Details  Name: Karen Hopkins MRN: [id number] Date of Birth: October 02, 1975  Encounter Date: 06/10/2014      PT End of Session - 06/10/14 1054    Visit Number 10   Number of Visits 10   Date for PT Re-Evaluation 05/29/14   PT Start Time 1020   PT Stop Time 1100   PT Time Calculation (min) 40 min      Past Medical History  Diagnosis Date  . Seasonal allergies   . Lymphedema of arm     right; no BP or puncture to right arm  . Depression   . Anxiety   . Sinus headache   . Hypertension     under control with med., has been on med. x 2 yr.  . History of breast cancer 2011    right  . History of chemotherapy 2011  . History of radiation therapy 2011    Past Surgical History  Procedure Laterality Date  . Supra-umbilical hernia  4268  . Nasal septum surgery    . Latissimus flap to breast Right 03/12/2013    Procedure: RIGHT BREAST LATISSIMUS FLAP WITH EXPANDER PLACEMENT;  Surgeon: Theodoro Kos, DO;  Location: [facility];  Service: Plastics;  Laterality: Right;  . Cesarean section  07/15/2001; 03/18/2004; 11/21/2008  . Tubal ligation  11/21/2008  . Portacath placement Left 09/12/2009  . Modified radical mastectomy Right 03/11/2010  . Port-a-cath removal Left 12/01/2010  . Removal of tissue expander and placement of implant Right 08/23/2013    Procedure: REMOVAL RIGHT TISSUE EXPANDER AND PLACEMENT OF IMPLANT TO RIGHT BREAST ;  Surgeon: Theodoro Kos, DO;  Location: [facility];  Service: Plastics;  Laterality: Right;  . Breast reduction surgery Left 08/23/2013    Procedure: LEFT BREAST REDUCTION  ;  Surgeon: Theodoro Kos, DO;  Location: [facility];  Service: Plastics;  Laterality: Left;  . Liposuction Bilateral 08/23/2013    Procedure: LIPOSUCTION;  Surgeon: Theodoro Kos, DO;   Location: [facility];  Service: Plastics;  Laterality: Bilateral;    There were no vitals taken for this visit.  Visit Diagnosis:  Stiffness of joint, shoulder region, right  Right-sided back pain, unspecified location  Lymphedema of upper extremity following lymphadenectomy      Subjective Assessment - 06/10/14 1044    Symptoms Have been doing some of the exercises throughout the day, still not as consistent as I need to be yet though. And havent gotten my new sleeve yet, but it should be arriving any day.                    [facility] Adult PT Treatment/Exercise - 06/10/14 0001    Shoulder Exercises: Standing   Other Standing Exercises Stretches from Strength ABC Program: Bil chest, shoulder, tricep, calf, hamstring, figure 4 piriformis, quadricep all 1 rep and 15 sec holds   Other Standing Exercises Strength from [employer] ABC Program: Bil clamshell in S/L, squats, standing "W", bil hip abduction, scaption against wall with 2#, 6" bil step ups, bil tricep kickbacks with 2#, bil calf raises, and bil bicep curls with 2# all 10 reps each and pt demonstrated good technique with minor cuing for technique with tricep kickbacks.  [facility] Goals - 06-27-2014 1047    CC Long Term Goal  #1   Title Pt. will report at least 50% decrease in right thoracic and lumbar back pain.  Pt reports 20% improvement   Status Partially Met            Plan - 06/27/2014 1054    Clinical Impression Statement Pt demonstrated good technique with the Strength ABC Program today. She will be receiving her new compression garment soon as this was ordered before Christmas and pt has the Flexitouch pump.    Pt will benefit from skilled therapeutic intervention in order to improve on the following deficits Decreased activity tolerance;Decreased endurance;Decreased strength;Decreased range of motion;Pain   Rehab Potential Good   PT Next Visit Plan  Discharge visit.          G-Codes - 2014-06-27 1716    Functional Assessment Tool Used clinical judgement   Functional Limitation Other PT primary   Other PT Primary Goal Status ([id number]) At least 1 percent but less than 20 percent impaired, limited or restricted   Other PT Primary Discharge Status [phone]) At least 20 percent but less than 40 percent impaired, limited or restricted      Problem List Patient Active Problem List   Diagnosis Date Noted  . Breast cancer, right breast 05/24/2014  . Myalgia and myositis 05/17/2014  . Neoplasm related pain 03/12/2014  . CN (constipation) 11/26/2013  . H/O reduction mammoplasty 10/30/2013  . Seizure 10/06/2013  . Status post bilateral breast implants 08/31/2013  . Acquired absence of breast and absent nipple 08/23/2013  . S/P breast reconstruction, right 08/23/2013  . AC joint arthropathy 07/02/2013  . Routine adult health maintenance 05/29/2013  . Hypokalemia 05/21/2013  . Chronic pain 04/19/2013  . Elevated blood sugar 03/09/2013  . Wheezing 02/22/2013  . Acute bronchitis 02/22/2013  . Lymphedema 05/24/2011  . RHINOSINUSITIS, CHRONIC 04/08/2010  . Obesity 08/04/2006  . DEPRESSIVE DISORDER, NOS 08/04/2006  . RHINITIS, ALLERGIC 08/04/2006   PHYSICAL THERAPY DISCHARGE SUMMARY  Visits from Start of Care: 10  Current functional level related to goals / functional outcomes: Pt has had a new compression sleeve and glove ordered and to be delivered tomorrow, has a flexitouch at home, and has been instructed and able to perform and log an exercise program She has all the tools to independently manage her edema at home, but she is not confident she will  follow through on her own.    Remaining deficits: Lymphedema, chronic pain   Education / Equipment: As above Plan: Patient agrees to discharge.  Patient goals were not met. Patient is being discharged due to                                                     Having new sleeve/glove  and being able to do exercise program on her own.  She continues to be followed by pain clinic for pain management.???        Donato Heinz. Owens Shark, PT  06-27-14  5:24 pm Outpatient [place], [place], [address] Phone: [phone]   Fax:  [phone]

## 2014-06-11 ENCOUNTER — Other Ambulatory Visit: Payer: Self-pay | Admitting: Oncology

## 2014-06-12 ENCOUNTER — Ambulatory Visit: Payer: Medicare Other

## 2014-06-12 ENCOUNTER — Encounter: Payer: Medicare Other | Attending: Physical Medicine & Rehabilitation | Admitting: Registered Nurse

## 2014-06-12 ENCOUNTER — Encounter: Payer: Self-pay | Admitting: Registered Nurse

## 2014-06-12 ENCOUNTER — Other Ambulatory Visit: Payer: Self-pay | Admitting: Physical Medicine & Rehabilitation

## 2014-06-12 VITALS — BP 138/76 | HR 94 | Resp 14

## 2014-06-12 DIAGNOSIS — I890 Lymphedema, not elsewhere classified: Secondary | ICD-10-CM | POA: Diagnosis present

## 2014-06-12 DIAGNOSIS — IMO0001 Reserved for inherently not codable concepts without codable children: Secondary | ICD-10-CM

## 2014-06-12 DIAGNOSIS — G893 Neoplasm related pain (acute) (chronic): Secondary | ICD-10-CM

## 2014-06-12 DIAGNOSIS — M609 Myositis, unspecified: Secondary | ICD-10-CM

## 2014-06-12 DIAGNOSIS — Z79899 Other long term (current) drug therapy: Secondary | ICD-10-CM

## 2014-06-12 DIAGNOSIS — G894 Chronic pain syndrome: Secondary | ICD-10-CM

## 2014-06-12 DIAGNOSIS — M791 Myalgia: Secondary | ICD-10-CM

## 2014-06-12 DIAGNOSIS — M546 Pain in thoracic spine: Secondary | ICD-10-CM | POA: Diagnosis present

## 2014-06-12 DIAGNOSIS — Z5181 Encounter for therapeutic drug level monitoring: Secondary | ICD-10-CM

## 2014-06-12 MED ORDER — OXYCODONE HCL 5 MG PO TABS: 5.0000 mg | ORAL_TABLET | Freq: Four times a day (QID) | ORAL | Status: DC | PRN

## 2014-06-12 NOTE — Progress Notes (Signed)
Subjective:    Patient ID: Karen Hopkins, female    DOB: 03/09/1976, 39 y.o.   MRN: [id number]  HPI: [name] is a 39 year old female who returns for follow up for chronic pain and medication refill. She says her pain is located in her right shoulder and lower back. She rates her pain 7. Her current exercise regime is walking attending physical therapy twice a week. S/P acupuncture with good relief noted.   Pain Inventory Average Pain 7 Pain Right Now 7 My pain is sharp, burning, dull, stabbing, tingling and aching  In the last 24 hours, has pain interfered with the following? General activity 7 Relation with others 7 Enjoyment of life 7 What TIME of day is your pain at its worst? all Sleep (in general) Poor  Pain is worse with: walking, bending, sitting, inactivity, standing and some activites Pain improves with: therapy/exercise and medication Relief from Meds: 7  Mobility walk without assistance how many minutes can you walk? 15 ability to climb steps?  yes do you drive?  yes Do you have any goals in this area?  yes  Function disabled: date disabled . I need assistance with the following:  household duties Do you have any goals in this area?  yes  Neuro/Psych weakness numbness tingling spasms dizziness confusion depression anxiety  Prior Studies Any changes since last visit?  no  Physicians involved in your care Any changes since last visit?  no   Family History  Problem Relation Age of Onset  . Hypertension Mother   . Diabetes type II Father   . Prostate cancer Father    History   Social History  . Marital Status: Single    Spouse Name: N/A    Number of Children: N/A  . Years of Education: N/A   Social History Main Topics  . Smoking status: Former Smoker -- 0.25 packs/day for 5 years    Quit date: 04/26/2010  . Smokeless tobacco: Never Used  . Alcohol Use: No  . Drug Use: No  . Sexual Activity: Not Currently    Birth Control/  Protection: Surgical   Other Topics Concern  . None   Social History Narrative   Past Surgical History  Procedure Laterality Date  . Supra-umbilical hernia  3646  . Nasal septum surgery    . Latissimus flap to breast Right 03/12/2013    Procedure: RIGHT BREAST LATISSIMUS FLAP WITH EXPANDER PLACEMENT;  Surgeon: Theodoro Kos, DO;  Location: [facility];  Service: Plastics;  Laterality: Right;  . Cesarean section  07/15/2001; 03/18/2004; 11/21/2008  . Tubal ligation  11/21/2008  . Portacath placement Left 09/12/2009  . Modified radical mastectomy Right 03/11/2010  . Port-a-cath removal Left 12/01/2010  . Removal of tissue expander and placement of implant Right 08/23/2013    Procedure: REMOVAL RIGHT TISSUE EXPANDER AND PLACEMENT OF IMPLANT TO RIGHT BREAST ;  Surgeon: Theodoro Kos, DO;  Location: [facility];  Service: Plastics;  Laterality: Right;  . Breast reduction surgery Left 08/23/2013    Procedure: LEFT BREAST REDUCTION  ;  Surgeon: Theodoro Kos, DO;  Location: [facility];  Service: Plastics;  Laterality: Left;  . Liposuction Bilateral 08/23/2013    Procedure: LIPOSUCTION;  Surgeon: Theodoro Kos, DO;  Location: [facility];  Service: Plastics;  Laterality: Bilateral;   Past Medical History  Diagnosis Date  . Seasonal allergies   . Lymphedema of arm     right; no BP or  puncture to right arm  . Depression   . Anxiety   . Sinus headache   . Hypertension     under control with med., has been on med. x 2 yr.  . History of breast cancer 2011    right  . History of chemotherapy 2011  . History of radiation therapy 2011   BP 138/76 mmHg  Pulse 94  Resp 14  SpO2 100%  Opioid Risk Score:   Fall Risk Score: Moderate Fall Risk (6-13 points) Review of Systems  Neurological: Positive for dizziness, weakness and numbness.       Tingling spasms  Psychiatric/Behavioral: Positive for confusion and dysphoric mood. The patient is nervous/anxious.   All  other systems reviewed and are negative.      Objective:   Physical Exam  Constitutional: She is oriented to [place], place, and time. She appears well-developed and well-nourished.  HENT:  Head: Normocephalic and atraumatic.  Neck: Normal range of motion. Neck supple.  Cervical Paraspinal Tenderness: C-3- C-5  Cardiovascular: Normal rate, regular rhythm and normal heart sounds.   Pulmonary/Chest: Effort normal.  Musculoskeletal:  Normal Muscle Bulk and Muscle Testing Reveals: Upper Extremities: Left Full ROM and Muscle strength 5/5 Right Decreased ROM 90 Degrees Right AC Joint Tenderness Thoracic Paraspinal Tenderness: T-5-T-8 Lumbar Paraspinal Tenderness: L-3- L-4 Lower Extremities: Full ROM and Muscle strength 5/5 Arises from chair with ease Narrow Based Gait   Neurological: She is alert and oriented to [place], place, and time.  Skin: Skin is warm and dry.  Psychiatric: She has a normal mood and affect.  Nursing note and vitals reviewed.         Assessment & Plan:  1.Myofascial pain syndrome chronic postoperative, as well as post radiation: S/P Breast Reconstruction Surgery x 2.  Refilled: Oxycodone 5 mg one tablet every 6 hours as needed for severe pain.#120 Continue with Exercise Regime andPhysical Therapy. Schedule for Acupuncture next visitwith Dr. Letta Pate.  2. Obesity: Continue Healthy Diet and Exercise Routine.  3. Constipation: Increase Fiber and water intake. Instructions Given.  20 minutes of face to face patient care time was spent during this visit. All questions were encouraged and answered.  F/U in 1 month

## 2014-06-13 LAB — PMP ALCOHOL METABOLITE (ETG): ETGU: NEGATIVE ng/mL

## 2014-06-14 ENCOUNTER — Ambulatory Visit: Payer: Medicare Other | Admitting: Physical Therapy

## 2014-06-17 ENCOUNTER — Ambulatory Visit: Payer: Medicare Other

## 2014-06-18 ENCOUNTER — Other Ambulatory Visit: Payer: Self-pay | Admitting: Oncology

## 2014-06-18 DIAGNOSIS — C50919 Malignant neoplasm of unspecified site of unspecified female breast: Secondary | ICD-10-CM

## 2014-06-18 DIAGNOSIS — Z872 Personal history of diseases of the skin and subcutaneous tissue: Secondary | ICD-10-CM

## 2014-06-19 ENCOUNTER — Ambulatory Visit: Payer: Medicare Other

## 2014-06-19 LAB — BENZODIAZEPINES (GC/LC/MS), URINE
Alprazolam metabolite (GC/LC/MS), ur confirm: 259 ng/mL (ref <25)
Clonazepam metabolite (GC/LC/MS), ur confirm: NEGATIVE ng/mL (ref <25)
Flurazepam metabolite (GC/LC/MS), ur confirm: NEGATIVE ng/mL (ref <50)
LORAZEPAMU: NEGATIVE ng/mL (ref <50)
MIDAZOLAMU: NEGATIVE ng/mL (ref <50)
Nordiazepam (GC/LC/MS), ur confirm: NEGATIVE ng/mL (ref <50)
Oxazepam (GC/LC/MS), ur confirm: NEGATIVE ng/mL (ref <50)
TEMAZEPAMU: NEGATIVE ng/mL (ref <50)
Triazolam metabolite (GC/LC/MS), ur confirm: NEGATIVE ng/mL (ref <50)

## 2014-06-19 LAB — PRESCRIPTION MONITORING PROFILE (SOLSTAS)
Amphetamine/Meth: NEGATIVE ng/mL
BARBITURATE SCREEN, URINE: NEGATIVE ng/mL
BUPRENORPHINE, URINE: NEGATIVE ng/mL
Cannabinoid Scrn, Ur: NEGATIVE ng/mL
Carisoprodol, Urine: NEGATIVE ng/mL
Cocaine Metabolites: NEGATIVE ng/mL
Creatinine, Urine: 204.29 mg/dL (ref >20.0)
ECSTASY: NEGATIVE ng/mL
Fentanyl, Ur: NEGATIVE ng/mL
Meperidine, Ur: NEGATIVE ng/mL
Methadone Screen, Urine: NEGATIVE ng/mL
NITRITES URINE, INITIAL: NEGATIVE ug/mL
PROPOXYPHENE: NEGATIVE ng/mL
TAPENTADOLUR: NEGATIVE ng/mL
Tramadol Scrn, Ur: NEGATIVE ng/mL
ZOLPIDEM, URINE: NEGATIVE ng/mL
pH, Initial: 5.6 pH (ref 4.5–8.9)

## 2014-06-19 LAB — OPIATES/OPIOIDS (LC/MS-MS)
Codeine Urine: NEGATIVE ng/mL (ref <50)
Hydrocodone: NEGATIVE ng/mL (ref <50)
Hydromorphone: NEGATIVE ng/mL (ref <50)
Morphine Urine: NEGATIVE ng/mL (ref <50)
Norhydrocodone, Ur: NEGATIVE ng/mL (ref <50)
Noroxycodone, Ur: 6086 ng/mL (ref <50)
OXYCODONE, UR: 5592 ng/mL (ref <50)
Oxymorphone: 3109 ng/mL (ref <50)

## 2014-06-19 LAB — OXYCODONE, URINE (LC/MS-MS)
NOROXYCODONE, UR: 6086 ng/mL (ref <50)
OXYMORPHONE, URINE: 3109 ng/mL (ref <50)
Oxycodone, ur: 5592 ng/mL (ref <50)

## 2014-06-21 ENCOUNTER — Telehealth: Payer: Self-pay | Admitting: *Deleted

## 2014-06-21 ENCOUNTER — Other Ambulatory Visit: Payer: Self-pay | Admitting: *Deleted

## 2014-06-21 ENCOUNTER — Ambulatory Visit: Payer: Medicare Other | Admitting: Physical Therapy

## 2014-06-21 MED ORDER — AZITHROMYCIN 250 MG PO TABS: ORAL_TABLET | ORAL | Status: DC

## 2014-06-21 NOTE — Progress Notes (Signed)
Urine drug screen for this encounter is consistent for prescribed medications.   

## 2014-06-21 NOTE — Telephone Encounter (Signed)
Pt called to this RN to state onset of sinus congestion with headaches- Denies fever " but when I get these you guys call me in a Z pak "  Karen Hopkins has not seen her primary MD recently due to ongoing appointments with PT for severe lymphedema and pain management per Dr Letta Pate.  This note will be shown to MD for recommendation.

## 2014-07-15 ENCOUNTER — Encounter: Payer: Medicare Other | Attending: Physical Medicine & Rehabilitation

## 2014-07-15 ENCOUNTER — Encounter: Payer: Self-pay | Admitting: Physical Medicine & Rehabilitation

## 2014-07-15 ENCOUNTER — Ambulatory Visit (HOSPITAL_BASED_OUTPATIENT_CLINIC_OR_DEPARTMENT_OTHER): Payer: Medicare Other | Admitting: Physical Medicine & Rehabilitation

## 2014-07-15 VITALS — BP 146/80 | HR 99 | Resp 14

## 2014-07-15 DIAGNOSIS — M797 Fibromyalgia: Secondary | ICD-10-CM

## 2014-07-15 DIAGNOSIS — M7918 Myalgia, other site: Secondary | ICD-10-CM

## 2014-07-15 DIAGNOSIS — I890 Lymphedema, not elsewhere classified: Secondary | ICD-10-CM | POA: Insufficient documentation

## 2014-07-15 DIAGNOSIS — M546 Pain in thoracic spine: Secondary | ICD-10-CM | POA: Insufficient documentation

## 2014-07-15 DIAGNOSIS — G893 Neoplasm related pain (acute) (chronic): Secondary | ICD-10-CM

## 2014-07-15 MED ORDER — POLYETHYLENE GLYCOL 3350 17 GM/SCOOP PO POWD: 17.0000 g | Freq: Every day | ORAL | Status: DC

## 2014-07-15 MED ORDER — OXYCODONE HCL 5 MG PO TABS: 5.0000 mg | ORAL_TABLET | Freq: Four times a day (QID) | ORAL | Status: DC | PRN

## 2014-07-15 MED ORDER — NAPROXEN 500 MG PO TABS: 500.0000 mg | ORAL_TABLET | Freq: Two times a day (BID) | ORAL | Status: DC | PRN

## 2014-07-15 NOTE — Progress Notes (Signed)
Subjective:    Patient ID: Karen Hopkins, female    DOB: 1975/08/27, 39 y.o.   MRN: [id number]  HPI Stopped therapy, didn't keep up with PT exercises Still with occ lymphedema treatments with 2x per wk exercises including stretching  Good relief with zanaflex and oxycodone Constipation relieved by miralax, would like generic    Pain Inventory Average Pain 8 Pain Right Now 8 My pain is intermittent, sharp, burning, dull, stabbing, tingling and aching  In the last 24 hours, has pain interfered with the following? General activity 8 Relation with others 8 Enjoyment of life 8 What TIME of day is your pain at its worst? all Sleep (in general) Poor  Pain is worse with: walking, bending, sitting, inactivity, standing and some activites Pain improves with: therapy/exercise, medication and injections Relief from Meds: 6  Mobility walk with assistance use a cane how many minutes can you walk? 10 ability to climb steps?  yes do you drive?  yes  Function disabled: date disabled . I need assistance with the following:  household duties  Neuro/Psych numbness tingling spasms dizziness confusion depression anxiety  Prior Studies Any changes since last visit?  no  Physicians involved in your care Any changes since last visit?  no   Family History  Problem Relation Age of Onset  . Hypertension Mother   . Diabetes type II Father   . Prostate cancer Father    History   Social History  . Marital Status: Single    Spouse Name: N/A    Number of Children: N/A  . Years of Education: N/A   Social History Main Topics  . Smoking status: Former Smoker -- 0.25 packs/day for 5 years    Quit date: 04/26/2010  . Smokeless tobacco: Never Used  . Alcohol Use: No  . Drug Use: No  . Sexual Activity: Not Currently    Birth Control/ Protection: Surgical   Other Topics Concern  . None   Social History Narrative   Past Surgical History  Procedure Laterality Date  .  Supra-umbilical hernia  9509  . Nasal septum surgery    . Latissimus flap to breast Right 03/12/2013    Procedure: RIGHT BREAST LATISSIMUS FLAP WITH EXPANDER PLACEMENT;  Surgeon: Theodoro Kos, DO;  Location: [place];  Service: Plastics;  Laterality: Right;  . Cesarean section  07/15/2001; 03/18/2004; 11/21/2008  . Tubal ligation  11/21/2008  . Portacath placement Left 09/12/2009  . Modified radical mastectomy Right 03/11/2010  . Port-a-cath removal Left 12/01/2010  . Removal of tissue expander and placement of implant Right 08/23/2013    Procedure: REMOVAL RIGHT TISSUE EXPANDER AND PLACEMENT OF IMPLANT TO RIGHT BREAST ;  Surgeon: Theodoro Kos, DO;  Location: [facility];  Service: Plastics;  Laterality: Right;  . Breast reduction surgery Left 08/23/2013    Procedure: LEFT BREAST REDUCTION  ;  Surgeon: Theodoro Kos, DO;  Location: [place];  Service: Plastics;  Laterality: Left;  . Liposuction Bilateral 08/23/2013    Procedure: LIPOSUCTION;  Surgeon: Theodoro Kos, DO;  Location: [place];  Service: Plastics;  Laterality: Bilateral;   Past Medical History  Diagnosis Date  . Seasonal allergies   . Lymphedema of arm     right; no BP or puncture to right arm  . Depression   . Anxiety   . Sinus headache   . Hypertension     under control with med., has been on med. x 2 yr.  . History  of breast cancer 2011    right  . History of chemotherapy 2011  . History of radiation therapy 2011   BP 146/80 mmHg  Pulse 99  Resp 14  SpO2 99%  Opioid Risk Score:   Fall Risk Score: Moderate Fall Risk (6-13 points) (patient previously educated)  Review of Systems  Constitutional:       Night sweats Weight gain   Gastrointestinal: Positive for constipation.  Neurological: Positive for dizziness and numbness.       Tingling Spasms   Psychiatric/Behavioral: Positive for confusion and dysphoric mood. The patient is nervous/anxious.   All other systems  reviewed and are negative.      Objective:   Physical Exam  Constitutional: She is oriented to [place], place, and time. She appears well-developed.  obese  HENT:  Head: Normocephalic and atraumatic.  Eyes: Conjunctivae and EOM are normal. Pupils are equal, round, and reactive to light.  Musculoskeletal:  Tenderness to palpation over the right levator, right infraspinatus  Neurological: She is alert and oriented to [place], place, and time. She has normal strength.  Psychiatric: She has a normal mood and affect.  Nursing note and vitals reviewed.         Assessment & Plan:  1. Myofascial pain syndrome, mainly affecting right parascapular area. We'll do a single needle technique with acupuncture needle in the right infraspinatus as well as right levator. Referral to PT for dry needling.  Single needle technique to right SI 11 as well as right SI 14 patient tolerated procedure well  2. Neoplasm-related pain multifactorial right anterior chest status post mastectomy as well as radiation therapy. Continue oxycodone 5 mg 4 times a day, Tizanidine 4 mg 3 times a day  Discussed patient with oncology, she is in remission and just has chronic pain issues as well as lymphedema

## 2014-07-24 ENCOUNTER — Other Ambulatory Visit: Payer: Self-pay

## 2014-07-24 DIAGNOSIS — C50919 Malignant neoplasm of unspecified site of unspecified female breast: Secondary | ICD-10-CM

## 2014-07-24 DIAGNOSIS — Z872 Personal history of diseases of the skin and subcutaneous tissue: Secondary | ICD-10-CM

## 2014-07-24 MED ORDER — ALPRAZOLAM 1 MG PO TABS: 1.0000 mg | ORAL_TABLET | Freq: Two times a day (BID) | ORAL | Status: DC

## 2014-07-24 NOTE — Telephone Encounter (Signed)
Notice rcvd from [place] re: pt prescription from Alprazalom XR - extended release no longer covered by insurance.  New prescription called in to [facility] pharmacist for 1mg  Alprazalom IR BID, fill 60, 3 refills.

## 2014-07-30 ENCOUNTER — Ambulatory Visit: Payer: Medicare Other | Attending: Physical Medicine & Rehabilitation | Admitting: Physical Therapy

## 2014-07-30 DIAGNOSIS — I890 Lymphedema, not elsewhere classified: Secondary | ICD-10-CM | POA: Insufficient documentation

## 2014-07-30 DIAGNOSIS — M5489 Other dorsalgia: Secondary | ICD-10-CM | POA: Diagnosis present

## 2014-07-30 DIAGNOSIS — E8989 Other postprocedural endocrine and metabolic complications and disorders: Secondary | ICD-10-CM | POA: Insufficient documentation

## 2014-07-30 DIAGNOSIS — M25611 Stiffness of right shoulder, not elsewhere classified: Secondary | ICD-10-CM | POA: Diagnosis present

## 2014-07-30 DIAGNOSIS — M6281 Muscle weakness (generalized): Secondary | ICD-10-CM

## 2014-07-30 DIAGNOSIS — M7918 Myalgia, other site: Secondary | ICD-10-CM

## 2014-07-30 NOTE — Patient Instructions (Signed)
Seated scapular retractions without compensatory shoulder shrug

## 2014-07-30 NOTE — Therapy (Signed)
[place], [place], [address] Phone: [phone]   Fax:  [phone]  Physical Therapy Evaluation  Patient Details  Name: Karen Hopkins MRN: [id number] Date of Birth: 1975/10/14 Referring Provider:  Charlett Blake, MD  Encounter Date: 07/30/2014      PT End of Session - 07/30/14 1532    Visit Number 1   Number of Visits 16   Date for PT Re-Evaluation 09/10/14   PT Start Time 1100   PT Stop Time 1145   PT Time Calculation (min) 45 min   Activity Tolerance Patient limited by pain      Past Medical History  Diagnosis Date  . Seasonal allergies   . Lymphedema of arm     right; no BP or puncture to right arm  . Depression   . Anxiety   . Sinus headache   . Hypertension     under control with med., has been on med. x 2 yr.  . History of breast cancer 2011    right  . History of chemotherapy 2011  . History of radiation therapy 2011    Past Surgical History  Procedure Laterality Date  . Supra-umbilical hernia  5027  . Nasal septum surgery    . Latissimus flap to breast Right 03/12/2013    Procedure: RIGHT BREAST LATISSIMUS FLAP WITH EXPANDER PLACEMENT;  Surgeon: Theodoro Kos, DO;  Location: [place];  Service: Plastics;  Laterality: Right;  . Cesarean section  07/15/2001; 03/18/2004; 11/21/2008  . Tubal ligation  11/21/2008  . Portacath placement Left 09/12/2009  . Modified radical mastectomy Right 03/11/2010  . Port-a-cath removal Left 12/01/2010  . Removal of tissue expander and placement of implant Right 08/23/2013    Procedure: REMOVAL RIGHT TISSUE EXPANDER AND PLACEMENT OF IMPLANT TO RIGHT BREAST ;  Surgeon: Theodoro Kos, DO;  Location: [place];  Service: Plastics;  Laterality: Right;  . Breast reduction surgery Left 08/23/2013    Procedure: LEFT BREAST REDUCTION  ;  Surgeon: Theodoro Kos, DO;  Location: [place] [facility];  Service: Plastics;  Laterality: Left;  .  Liposuction Bilateral 08/23/2013    Procedure: LIPOSUCTION;  Surgeon: Theodoro Kos, DO;  Location: [place];  Service: Plastics;  Laterality: Bilateral;    There were no vitals taken for this visit.  Visit Diagnosis:  Myofascial muscle pain - Plan: PT plan of care cert/re-cert  Shoulder joint stiffness, right - Plan: PT plan of care cert/re-cert  Muscle weakness of right upper extremity - Plan: PT plan of care cert/re-cert      Subjective Assessment - 07/30/14 1110    Symptoms States her right neck, superior shoulder, around right shoulder blade and mid back as a result of mastectomy with 22 lymph nodes removed.  2 additional surgeries which aggravated the problem.  Lymphadema somewhat controlled with a sleeve, Flexitouch and a night sleeve.    Last surgery 07/26/14.  Was dry needled by Dr. Letta Pate twice with some relief especially after the first time.      Pertinent History Right breast cancer with mastectomy 2011; reconstruction; left breast reduction; h/o right UE lymphedema and right upper quadrant pain, treated in this clinic several times before.  Recent scare with left breeast, but but ultrasound and mammogram were negative.   Limitations House hold activities  drop things with right hand   How long can you sit comfortably? numbness in 20 min   How long can you stand comfortably?  15 min   How long can you walk comfortably? 15 min   Diagnostic tests mammogram, U/S;  another follow up with left breast due now   Patient Stated Goals decrease pain and use arm regularly (right hand dominant)   Currently in Pain? Yes   Pain Score 8    Pain Location Neck   Pain Orientation Right   Pain Descriptors / Indicators Aching;Spasm   Pain Type Chronic pain   Pain Onset More than a month ago   Pain Frequency Constant   Aggravating Factors  reaching forward/across; reaching overhead   Pain Relieving Factors stretching; pain pills           [facility] PT Assessment - 07/30/14  1125    Assessment   Medical Diagnosis myofascial muscle pain   Onset Date 04/26/14   Next MD Visit 08/11/14   Prior Therapy cancer rehab discharged last month   Precautions   Precautions Other (comment)   Precaution Comments cancer precautions   Restrictions   Weight Bearing Restrictions No   Balance Screen   Has the patient fallen in the past 6 months Yes  slipped because of a spider   How many times? 1   Has the patient had a decrease in activity level because of a fear of falling?  No   Is the patient reluctant to leave their home because of a fear of falling?  No   Home Environment   Living Enviornment Private residence   Living Arrangements Children  3 boys   Prior Function   Level of Independence Independent with basic ADLs   Vocation On disability   Leisure watch boys play sports   Observation/Other Assessments   Quick DASH  70.5 severe difficulty with ADLs   Posture/Postural Control   Posture Comments --  right shoulder shrug/compensatory elevation with UE flexion   ROM / Strength   AROM / PROM / Strength AROM;Strength;PROM   AROM   AROM Assessment Site Shoulder   Right/Left Shoulder Right;Left   Right Shoulder Flexion 82 Degrees   Right Shoulder ABduction 88 Degrees   Right Shoulder Internal Rotation --  behind back to sacrum   Right Shoulder External Rotation 67 Degrees   Left Shoulder Flexion 147 Degrees   Left Shoulder ABduction 160 Degrees   Left Shoulder Internal Rotation --  behind back to T6   Left Shoulder External Rotation 70 Degrees   Strength   Strength Assessment Site Shoulder   Right/Left Shoulder Right;Left   Right Shoulder Flexion 2+/5   Right Shoulder Extension 2+/5   Right Shoulder ABduction 2+/5   Right Shoulder Internal Rotation 2+/5   Right Shoulder External Rotation 2+/5   Palpation   Palpation --  marked spasm & trigger points right upper trap,levator,rhomb   Special Tests    Special Tests --  Pain produced with right HADD                           PT Education - 07/30/14 1532    Education provided Yes   Education Details scapular retraction without shoulder shrug   [place](s) Educated Patient   Methods Explanation;Demonstration   Comprehension Verbalized understanding;Returned demonstration;Need further instruction          PT Short Term Goals - 07/30/14 1739    PT SHORT TERM GOAL #1   Title Patient will report a 25% reduction in pain with usual daily activities and home chores.   Time 4  Period Weeks   Status New   PT SHORT TERM GOAL #2   Title Patient will be able to resume a basic home ex program with minimal increase in pain    Time 4   Period Weeks   Status New   PT SHORT TERM GOAL #3   Title Right shoulder flexion improved to 92 degrees needed for reaching shoulder height shelves.   Time 4   Period Weeks   Status New   PT SHORT TERM GOAL #4   Title Quick DASH score improved from 70.5 "severe difficulty" with ADLs level to 60 indicating improved function with less pain with grooming, household chores and sleep.     Time 4   Period Weeks   Status New           PT Long Term Goals - 07/30/14 1744    PT LONG TERM GOAL #1   Title Independent with progressive HEP for further ROM, strengthening of Right UE.   Time 8   Period Weeks   Status New   PT LONG TERM GOAL #2   Title Right shoulder flexion and abduction AROM improved to 105 degrees needed for reaching eye level shelves in the kitchen.     Time 8   Period Weeks   Status New   PT LONG TERM GOAL #3   Title Right UE strength grossly 3+/5 needed for basic cooking/cleaning for her 3 children.     Time 8   Period Weeks   Status New   PT LONG TERM GOAL #4   Title Quick DASH score improved to 50 indicating "moderate difficulty" with basic ADLS rather than "severe difficulty'"   Time 8   Period Weeks   Status New   PT LONG TERM GOAL #5   Title Patient will report overall improvement in pain level and function at  50%.   Time 8   Period Weeks   Status New           Long Term Clinic Goals - 06/10/14 1047    CC Long Term Goal  #1   Title Pt. will report at least 50% decrease in right thoracic and lumbar back pain.  Pt reports 20% improvement   Status Partially Met            Plan - 07/30/14 1533    Clinical Impression Statement The patient is a pleasant 39 year old female who complains of persistent right shoulder, right lower neck and right shoulder blade pain as result of her mastectomy and reconstructive breast surgeries.  She reports she has undergone treatment for lymphedema as well and has a sleeve, night sleeve and Flexitouch for swelling management.  She states she has been needled twice by Dr. Letta Pate with good relief for this myofascial pain in the upper trap/levator scap muscle region.  She  reports she did not notice an increase in swelling after this intervention.  She is referred to PT at this time for eval and treat including "dry needling of right trap, levator and infraspinatus."  Patient reports increased pain with reaching overhead (compensatory shoulder shrug) and reaching across her body (HADD).  As a result she is having difficulty with household chores including cooking and cleaning for her 3 boys.  Decreased right shoulder AROM (dominant hand) and painful:  flexion 82, abd 88, ER 67, IR  behind back to T6.  She has a reduction in flexion AROM as compared to measurements taken during lymphedema PT in Fall/Winter.  Pt will benefit from skilled therapeutic intervention in order to improve on the following deficits Decreased strength;Pain;Impaired UE functional use;Difficulty walking;Impaired flexibility;Decreased range of motion   Rehab Potential Good   Clinical Impairments Affecting Rehab Potential lymphedema   PT Frequency 2x / week   PT Duration 8 weeks   PT Next Visit Plan Dry needling to right upper trap, levator, rhomboids, infraspinatus, subscapularis; manual therapy;  possible taping to inhibit overuse of upper trap; shoulder ROM          G-Codes - 2014-07-31 1749    Functional Assessment Tool Used Quick DASH; clinical judgement   Functional Limitation Carrying, moving and handling objects   Carrying, Moving and Handling Objects Current Status (Z2755) At least 60 percent but less than 80 percent impaired, limited or restricted   Carrying, Moving and Handling Objects Goal Status (C2392) At least 40 percent but less than 60 percent impaired, limited or restricted       Problem List Patient Active Problem List   Diagnosis Date Noted  . Breast cancer, right breast 05/24/2014  . Myalgia and myositis 05/17/2014  . Neoplasm related pain 03/12/2014  . CN (constipation) 11/26/2013  . H/O reduction mammoplasty 10/30/2013  . Seizure 10/06/2013  . Status post bilateral breast implants 08/31/2013  . Acquired absence of breast and absent nipple 08/23/2013  . S/P breast reconstruction, right 08/23/2013  . AC joint arthropathy 07/02/2013  . Routine adult health maintenance 05/29/2013  . Hypokalemia 05/21/2013  . Chronic pain 04/19/2013  . Elevated blood sugar 03/09/2013  . Wheezing 02/22/2013  . Acute bronchitis 02/22/2013  . Lymphedema 05/24/2011  . RHINOSINUSITIS, CHRONIC 04/08/2010  . Obesity 08/04/2006  . DEPRESSIVE DISORDER, NOS 08/04/2006  . RHINITIS, ALLERGIC 08/04/2006    Alvera Singh 31-Jul-2014, 5:51 PM  [facility] [address] [place], [place], [address] Phone: [phone]   Fax:  [phone]  Ruben Im, PT 07-31-14 5:52 PM Phone: [phone] Fax: [phone]

## 2014-08-02 ENCOUNTER — Other Ambulatory Visit: Payer: Self-pay | Admitting: Oncology

## 2014-08-02 DIAGNOSIS — Z9011 Acquired absence of right breast and nipple: Secondary | ICD-10-CM

## 2014-08-02 DIAGNOSIS — Z9889 Other specified postprocedural states: Secondary | ICD-10-CM

## 2014-08-02 DIAGNOSIS — N6459 Other signs and symptoms in breast: Secondary | ICD-10-CM

## 2014-08-12 ENCOUNTER — Encounter: Payer: Medicare Other | Attending: Physical Medicine & Rehabilitation | Admitting: Registered Nurse

## 2014-08-12 ENCOUNTER — Encounter: Payer: Self-pay | Admitting: Registered Nurse

## 2014-08-12 ENCOUNTER — Ambulatory Visit: Payer: Medicare Other | Attending: Physical Medicine & Rehabilitation | Admitting: Physical Therapy

## 2014-08-12 VITALS — BP 152/80 | HR 99 | Resp 14

## 2014-08-12 DIAGNOSIS — G893 Neoplasm related pain (acute) (chronic): Secondary | ICD-10-CM

## 2014-08-12 DIAGNOSIS — M546 Pain in thoracic spine: Secondary | ICD-10-CM | POA: Insufficient documentation

## 2014-08-12 DIAGNOSIS — E8989 Other postprocedural endocrine and metabolic complications and disorders: Secondary | ICD-10-CM | POA: Insufficient documentation

## 2014-08-12 DIAGNOSIS — Z5181 Encounter for therapeutic drug level monitoring: Secondary | ICD-10-CM

## 2014-08-12 DIAGNOSIS — M797 Fibromyalgia: Secondary | ICD-10-CM

## 2014-08-12 DIAGNOSIS — M545 Low back pain, unspecified: Secondary | ICD-10-CM

## 2014-08-12 DIAGNOSIS — M62838 Other muscle spasm: Secondary | ICD-10-CM

## 2014-08-12 DIAGNOSIS — M25611 Stiffness of right shoulder, not elsewhere classified: Secondary | ICD-10-CM | POA: Insufficient documentation

## 2014-08-12 DIAGNOSIS — R293 Abnormal posture: Secondary | ICD-10-CM

## 2014-08-12 DIAGNOSIS — G894 Chronic pain syndrome: Secondary | ICD-10-CM

## 2014-08-12 DIAGNOSIS — I890 Lymphedema, not elsewhere classified: Secondary | ICD-10-CM | POA: Diagnosis not present

## 2014-08-12 DIAGNOSIS — Z79899 Other long term (current) drug therapy: Secondary | ICD-10-CM

## 2014-08-12 DIAGNOSIS — M7918 Myalgia, other site: Secondary | ICD-10-CM

## 2014-08-12 DIAGNOSIS — M5489 Other dorsalgia: Secondary | ICD-10-CM | POA: Insufficient documentation

## 2014-08-12 MED ORDER — OXYCODONE HCL 5 MG PO TABS: 5.0000 mg | ORAL_TABLET | Freq: Four times a day (QID) | ORAL | Status: DC | PRN

## 2014-08-12 NOTE — Therapy (Signed)
[place], [place], [address] Phone: [phone]   Fax:  [phone]  Physical Therapy Treatment  Patient Details  Name: Karen Hopkins MRN: [id number] Date of Birth: Nov 22, 1975 Referring Provider:  [name], MD  Encounter Date: 08/12/2014      PT End of Session - 08/12/14 1351    Visit Number 2   Number of Visits 16   Date for PT Re-Evaluation 09/10/14   PT Start Time 1332   PT Stop Time 1430   PT Time Calculation (min) 58 min   Activity Tolerance Patient tolerated treatment well   Behavior During Therapy [facility] for tasks assessed/performed      Past Medical History  Diagnosis Date  . Seasonal allergies   . Lymphedema of arm     right; no BP or puncture to right arm  . Depression   . Anxiety   . Sinus headache   . Hypertension     under control with med., has been on med. x 2 yr.  . History of breast cancer 2011    right  . History of chemotherapy 2011  . History of radiation therapy 2011    Past Surgical History  Procedure Laterality Date  . Supra-umbilical hernia  9417  . Nasal septum surgery    . Latissimus flap to breast Right 03/12/2013    Procedure: RIGHT BREAST LATISSIMUS FLAP WITH EXPANDER PLACEMENT;  Surgeon: Theodoro Kos, DO;  Location: [place];  Service: Plastics;  Laterality: Right;  . Cesarean section  07/15/2001; 03/18/2004; 11/21/2008  . Tubal ligation  11/21/2008  . Portacath placement Left 09/12/2009  . Modified radical mastectomy Right 03/11/2010  . Port-a-cath removal Left 12/01/2010  . Removal of tissue expander and placement of implant Right 08/23/2013    Procedure: REMOVAL RIGHT TISSUE EXPANDER AND PLACEMENT OF IMPLANT TO RIGHT BREAST ;  Surgeon: Theodoro Kos, DO;  Location: [place];  Service: Plastics;  Laterality: Right;  . Breast reduction surgery Left 08/23/2013    Procedure: LEFT BREAST REDUCTION  ;  Surgeon: Theodoro Kos, DO;  Location: [place];  Service: Plastics;  Laterality: Left;  . Liposuction Bilateral 08/23/2013    Procedure: LIPOSUCTION;  Surgeon: Theodoro Kos, DO;  Location: [place];  Service: Plastics;  Laterality: Bilateral;    There were no vitals taken for this visit.  Visit Diagnosis:  Abnormal posture  Muscle spasms of neck      Subjective Assessment - 08/12/14 1334    Symptoms Right shoulder and neck trigger point pain, and sometimes headaches 3 x a week.    Pertinent History Right breast cancer with mastectomy 2011; reconstruction; left breast reduction; h/o right UE lymphedema and right upper quadrant pain, treated in this clinic several times before.  Recent scare with left breeast, but but ultrasound and mammogram were negative.   Patient Stated Goals decrease pain and use arm regularly (right hand dominant)   Pain Score 7    Pain Location Neck   Pain Orientation Right   Pain Descriptors / Indicators Aching;Sore;Nagging  stiff like a rock   Pain Type Chronic pain   Pain Onset More than a month ago   Pain Frequency Constant   Aggravating Factors  hard to reach with out pain   Pain Score --   Pain Location --   Pain Orientation --   Pain Descriptors / Indicators --   Pain Frequency Constant  [facility] Adult PT Treatment/Exercise - 08/12/14 1407    Posture/Postural Control   Posture/Postural Control Postural limitations   Postural Limitations Rounded Shoulders;Forward head;Increased thoracic kyphosis   Posture Comments Educated on sitting and standing posture   Neck Exercises: Seated   Neck Retraction 5 reps;3 secs   Shoulder Exercises: Stretch   Internal Rotation Stretch Limitations shoulder shrug 10 x    Manual Therapy   Manual Therapy Scapular mobilization;Myofascial release;Joint mobilization   Joint Mobilization Thoracic T-3 to T-10 PA mob  relief of pain   Myofascial Release Right upper trap and levator and rhomboid    Scapular Mobilization protraction and retraction with scpular lift under sub scapularis medially   Neck Exercises: Stretches   Upper Trapezius Stretch 2 reps;30 seconds  VC for techniques right side   Levator Stretch 2 reps;30 seconds  VC for correct technique Right side          Trigger Point Dry Needling - 08/12/14 1341    Consent Given? Yes   Education Handout Provided Yes   Muscles Treated Upper Body Upper trapezius;Levator scapulae;Rhomboids;Subscapularis;Supraspinatus;Infraspinatus  Right side only C-3 to C-6 erector spinae   Upper Trapezius Response Twitch reponse elicited;Palpable increased muscle length  right side only   Levator Scapulae Response Twitch response elicited;Palpable increased muscle length  right side only   Rhomboids Response Twitch response elicited;Palpable increased muscle length  right side only   Supraspinatus Response Twitch response elicited   right side only   Infraspinatus Response Twitch response elicited  right side only   Subscapularis Response Twitch response elicited;Palpable increased muscle length  right side only              PT Education - 08/12/14 1348    Education provided Yes   Education Details Trigger point dry needling precautain, postrue standing and sitting and Levator, trap and shoulder shrug and chin tuck   [place](s) Educated Patient   Methods Explanation;Demonstration;Verbal cues   Comprehension Verbalized understanding;Returned demonstration;Need further instruction;Verbal cues required          PT Short Term Goals - 08/12/14 1508    PT SHORT TERM GOAL #1   Title Patient will report a 25% reduction in pain with usual daily activities and home chores.   Time 4   Period Weeks   Status On-going   PT SHORT TERM GOAL #2   Title Patient will be able to resume a basic home ex program with minimal increase in pain    Time 4   Period Weeks   Status On-going   PT SHORT TERM GOAL #3   Title Right shoulder flexion  improved to 92 degrees needed for reaching shoulder height shelves.   Time 4   Period Weeks   Status On-going   PT SHORT TERM GOAL #4   Title Quick DASH score improved from 70.5 "severe difficulty" with ADLs level to 60 indicating improved function with less pain with grooming, household chores and sleep.     Time 4   Period Weeks   Status On-going           PT Long Term Goals - 07/30/14 1744    PT LONG TERM GOAL #1   Title Independent with progressive HEP for further ROM, strengthening of Right UE.   Time 8   Period Weeks   Status New   PT LONG TERM GOAL #2   Title Right shoulder flexion and abduction AROM improved to 105 degrees needed for reaching eye level shelves in the  kitchen.     Time 8   Period Weeks   Status New   PT LONG TERM GOAL #3   Title Right UE strength grossly 3+/5 needed for basic cooking/cleaning for her 3 children.     Time 8   Period Weeks   Status New   PT LONG TERM GOAL #4   Title Quick DASH score improved to 50 indicating "moderate difficulty" with basic ADLS rather than "severe difficulty'"   Time 8   Period Weeks   Status New   PT LONG TERM GOAL #5   Title Patient will report overall improvement in pain level and function at 50%.   Time 8   Period Weeks   Status New           Long Term Clinic Goals - 06/10/14 1047    CC Long Term Goal  #1   Title Pt. will report at least 50% decrease in right thoracic and lumbar back pain.  Pt reports 20% improvement   Status Partially Met            Plan - 08/12/14 1417    Clinical Impression Statement Pt with multiple twitch responses with Dry needling to upper trap, levator /subscapularis at medial scapula.  Pt with palpable muscle lengthening and decrease of pain in Upper trap and levator post dry needling and  moist heat application    Pt will benefit from skilled therapeutic intervention in order to improve on the following deficits Decreased strength;Pain;Impaired UE functional  use;Difficulty walking;Impaired flexibility;Decreased range of motion   Rehab Potential Good   Clinical Impairments Affecting Rehab Potential lymphedema   PT Frequency 2x / week   PT Duration 8 weeks   PT Next Visit Plan Dry needling to right upper trap, levator, rhomboids, infraspinatus, subscapularis; manual therapy; possible taping to inhibit overuse of upper trap; shoulder ROM        Problem List Patient Active Problem List   Diagnosis Date Noted  . Breast cancer, right breast 05/24/2014  . Myalgia and myositis 05/17/2014  . Neoplasm related pain 03/12/2014  . CN (constipation) 11/26/2013  . H/O reduction mammoplasty 10/30/2013  . Seizure 10/06/2013  . Status post bilateral breast implants 08/31/2013  . Acquired absence of breast and absent nipple 08/23/2013  . S/P breast reconstruction, right 08/23/2013  . AC joint arthropathy 07/02/2013  . Routine adult health maintenance 05/29/2013  . Hypokalemia 05/21/2013  . Chronic pain 04/19/2013  . Elevated blood sugar 03/09/2013  . Wheezing 02/22/2013  . Acute bronchitis 02/22/2013  . Lymphedema 05/24/2011  . RHINOSINUSITIS, CHRONIC 04/08/2010  . Obesity 08/04/2006  . DEPRESSIVE DISORDER, NOS 08/04/2006  . RHINITIS, ALLERGIC 08/04/2006   Voncille Lo, PT 08/12/2014 4:24 PM Phone: [phone] Fax: [facility] [address] [place], [place], [address] Phone: [phone]   Fax:  [phone]

## 2014-08-12 NOTE — Progress Notes (Signed)
Subjective:    Patient ID: Karen Hopkins, female    DOB: Feb 06, 1976, 39 y.o.   MRN: [id number]  HPI: [name] is a 39 year old female who returns for follow up for chronic pain and medication refill. She says her pain is located in her right shoulder and lower back. States having increase intensity of lower back pain, X-ray ordered. Could be adverse effect of metformin encouraged to speak to her PCP she verbalizes understanding. She rates her pain 7. Her current exercise regime is walking attending physical therapy twice a week schedule to start dry needling today. She's living a sedentary lifestyle encouraged to increase her activity she verbalizes understanding. Encourage to attend a support group for breast cancer and lymphedema client's she verbalizes understanding. Encouraged to seek counseling.  Pain Inventory Average Pain 7 Pain Right Now 7 My pain is constant, sharp, burning, dull, stabbing, tingling and aching  In the last 24 hours, has pain interfered with the following? General activity 1 Relation with others 4 Enjoyment of life 2 What TIME of day is your pain at its worst? all Sleep (in general) Poor  Pain is worse with: walking, bending, sitting, inactivity and standing Pain improves with: therapy/exercise and medication Relief from Meds: 8  Mobility walk without assistance how many minutes can you walk? 2 ability to climb steps?  yes do you drive?  yes Do you have any goals in this area?  yes  Function disabled: date disabled . I need assistance with the following:  meal prep, household duties and shopping Do you have any goals in this area?  yes  Neuro/Psych bowel control problems numbness tremor tingling trouble walking spasms confusion depression anxiety  Prior Studies Any changes since last visit?  no  Physicians involved in your care Any changes since last visit?  no   Family History  Problem Relation Age of Onset  . Hypertension  Mother   . Diabetes type II Father   . Prostate cancer Father    History   Social History  . Marital Status: Single    Spouse Name: N/A  . Number of Children: N/A  . Years of Education: N/A   Social History Main Topics  . Smoking status: Former Smoker -- 0.25 packs/day for 5 years    Quit date: 04/26/2010  . Smokeless tobacco: Never Used  . Alcohol Use: No  . Drug Use: No  . Sexual Activity: Not Currently    Birth Control/ Protection: Surgical   Other Topics Concern  . None   Social History Narrative   Past Surgical History  Procedure Laterality Date  . Supra-umbilical hernia  4270  . Nasal septum surgery    . Latissimus flap to breast Right 03/12/2013    Procedure: RIGHT BREAST LATISSIMUS FLAP WITH EXPANDER PLACEMENT;  Surgeon: Theodoro Kos, DO;  Location: [place];  Service: Plastics;  Laterality: Right;  . Cesarean section  07/15/2001; 03/18/2004; 11/21/2008  . Tubal ligation  11/21/2008  . Portacath placement Left 09/12/2009  . Modified radical mastectomy Right 03/11/2010  . Port-a-cath removal Left 12/01/2010  . Removal of tissue expander and placement of implant Right 08/23/2013    Procedure: REMOVAL RIGHT TISSUE EXPANDER AND PLACEMENT OF IMPLANT TO RIGHT BREAST ;  Surgeon: Theodoro Kos, DO;  Location: [place];  Service: Plastics;  Laterality: Right;  . Breast reduction surgery Left 08/23/2013    Procedure: LEFT BREAST REDUCTION  ;  Surgeon: Theodoro Kos, DO;  Location: [facility];  Service: Plastics;  Laterality: Left;  . Liposuction Bilateral 08/23/2013    Procedure: LIPOSUCTION;  Surgeon: Theodoro Kos, DO;  Location: [place];  Service: Plastics;  Laterality: Bilateral;   Past Medical History  Diagnosis Date  . Seasonal allergies   . Lymphedema of arm     right; no BP or puncture to right arm  . Depression   . Anxiety   . Sinus headache   . Hypertension     under control with med., has been on med. x 2 yr.  .  History of breast cancer 2011    right  . History of chemotherapy 2011  . History of radiation therapy 2011   There were no vitals taken for this visit.  Opioid Risk Score:   Fall Risk Score: Moderate Fall Risk (6-13 points)  Review of Systems  Gastrointestinal: Positive for constipation.  Musculoskeletal: Positive for joint swelling and gait problem.       Ankle swelling  Neurological: Positive for tremors and numbness.       Tingling  Spasms   Psychiatric/Behavioral: Positive for confusion and dysphoric mood. The patient is nervous/anxious.        Objective:   Physical Exam  Constitutional: She is oriented to [place], place, and time. She appears well-developed and well-nourished.  HENT:  Head: Normocephalic and atraumatic.  Neck: Normal range of motion. Neck supple.  Cardiovascular: Normal rate and regular rhythm.   Pulmonary/Chest: Effort normal and breath sounds normal.  Musculoskeletal:  Normal Muscle Bulk and Muscle Testing Reveals: Upper Extremities: Right: Decreased ROM 45 Degrees and Left: Full ROM and Muscle Strength 5/5 Right Spine of Scapula Tenderness Spinal Forward flexion 35 Degrees and Extension 20 Degrees Lumbar Paraspinal Tenderness: L-3- L-5 Lower Extremities: Full ROM and Muscle Strength 5/5 Arises from chair with ease Narrow Based Gait   Neurological: She is alert and oriented to [place], place, and time.  Skin: Skin is warm and dry.  Psychiatric: She has a normal mood and affect.  Nursing note and vitals reviewed.         Assessment & Plan:  1.Myofascial pain syndrome chronic postoperative, as well as post radiation: S/P Breast Reconstruction Surgery x 2.  Refilled: Oxycodone 5 mg one tablet every 6 hours as needed for severe pain.#120 Continue with Exercise Regime andPhysical Therapy. Starting Physical Therapy for Dry Needling. 2. Obesity: Continue Healthy Diet and Exercise Routine.  3. AcuteLow Back Pain: RX: X-ray  20 minutes of face  to face patient care time was spent during this visit. All questions were encouraged and answered.

## 2014-08-12 NOTE — Patient Instructions (Signed)
Levator Stretch   Grasp seat or sit on hand on side to be stretched. Turn head toward other side and look down. Use hand on head to gently stretch neck in that position. Hold _20-30___ seconds. Repeat on other side. Repeat __2-3__ times. Do _2-3___ sessions per day.  http://gt2.exer.us/30   Copyright  VHI. All rights reserved.  Side-Bending   One hand on opposite side of head, pull head to side as far as is comfortable. Stop if there is pain. Hold ____ seconds. Repeat with other hand to other side. Repeat ____ times. Do ____ sessions per day.   Copyright  VHI. All rights reserved.  Scapular Retraction (Standing)   With arms at sides, pinch shoulder blades together. Repeat ___10_ times per set. Do __1__ sets per session. Do __3__ sessions per day.  http://orth.exer.us/944   Copyright  VHI. All rights reserved.  Chin Protraction / Retraction   Slide head forward keeping chin level. Slide head back, pulling chin in. Hold each position _10__ seconds. Repeat _2__ times. Do _3__ sessions per day.  Copyright  VHI. All rights reserved. Posture Tips DO: - stand tall and erect - keep chin tucked in - keep head and shoulders in alignment - check posture regularly in mirror or large window - pull head back against headrest in car seat;  Change your position often.  Sit with lumbar support. DON'T: - slouch or slump while watching TV or reading - sit, stand or lie in one position  for too long;  Sitting is especially hard on the spine so if you sit at a desk/use the computer, then stand up often!   Copyright  VHI. All rights reserved.  Posture - Standing   Good posture is important. Avoid slouching and forward head thrust. Maintain curve in low back and align ears over shoul- ders, hips over ankles.  Pull your belly button in toward your back bone. Equal weight through heels and toes both side and remember heart to sky not military style   Copyright  VHI. All rights reserved.    Posture - Sitting   Sit upright, head facing forward. Try using a roll to support lower back. Keep shoulders relaxed, and avoid rounded back. Keep hips level with knees. Avoid crossing legs for long periods. Sit on your sit bones not your tail bone   Copyright  VHI. All rights reserved.  Trigger Point Dry Needling  . What is Trigger Point Dry Needling (DN)? o DN is a physical therapy technique used to treat muscle pain and dysfunction. Specifically, DN helps deactivate muscle trigger points (muscle knots).  o A thin filiform needle is used to penetrate the skin and stimulate the underlying trigger point. The goal is for a local twitch response (LTR) to occur and for the trigger point to relax. No medication of any kind is injected during the procedure.   . What Does Trigger Point Dry Needling Feel Like?  o The procedure feels different for each individual patient. Some patients report that they do not actually feel the needle enter the skin and overall the process is not painful. Very mild bleeding may occur. However, many patients feel a deep cramping in the muscle in which the needle was inserted. This is the local twitch response.   Marland Kitchen How Will I feel after the treatment? o Soreness is normal, and the onset of soreness may not occur for a few hours. Typically this soreness does not last longer than two days.  o Bruising is uncommon,  however; ice can be used to decrease any possible bruising.  o In rare cases feeling tired or nauseous after the treatment is normal. In addition, your symptoms may get worse before they get better, this period will typically not last longer than 24 hours.   . What Can I do After My Treatment? o Increase your hydration by drinking more water for the next 24 hours. o You may place ice or heat on the areas treated that have become sore, however, do not use heat on inflamed or bruised areas. Heat often brings more relief post needling. o You can continue your  regular activities, but vigorous activity is not recommended initially after the treatment for 24 hours. o DN is best combined with other physical therapy such as strengthening, stretching, and other therapies.

## 2014-08-13 ENCOUNTER — Ambulatory Visit
Admission: RE | Admit: 2014-08-13 | Discharge: 2014-08-13 | Disposition: A | Discharge status: Home / Self Care | Payer: Medicare Other | Source: Ambulatory Visit | Attending: Oncology | Admitting: Oncology

## 2014-08-13 ENCOUNTER — Ambulatory Visit
Admission: RE | Admit: 2014-08-13 | Discharge: 2014-08-13 | Disposition: A | Discharge status: Home / Self Care | Payer: Medicare Other | Source: Ambulatory Visit | Attending: Registered Nurse | Admitting: Registered Nurse

## 2014-08-13 ENCOUNTER — Telehealth: Payer: Self-pay | Admitting: Registered Nurse

## 2014-08-13 DIAGNOSIS — M545 Low back pain, unspecified: Secondary | ICD-10-CM

## 2014-08-13 DIAGNOSIS — N6459 Other signs and symptoms in breast: Secondary | ICD-10-CM

## 2014-08-13 DIAGNOSIS — Z9889 Other specified postprocedural states: Secondary | ICD-10-CM

## 2014-08-13 DIAGNOSIS — Z9011 Acquired absence of right breast and nipple: Secondary | ICD-10-CM

## 2014-08-13 NOTE — Telephone Encounter (Signed)
APlace a call to Ms. Vanterpool to review X-ray's, left message to return call. X-rays Negative

## 2014-08-14 ENCOUNTER — Telehealth: Payer: Self-pay | Admitting: Registered Nurse

## 2014-08-14 NOTE — Telephone Encounter (Signed)
Spoke to [name] 08/13/14, reviewed her X-ray results. She verbalizes understanding.

## 2014-08-15 ENCOUNTER — Ambulatory Visit: Payer: Medicare Other | Admitting: Physical Therapy

## 2014-08-15 DIAGNOSIS — M7918 Myalgia, other site: Secondary | ICD-10-CM

## 2014-08-15 DIAGNOSIS — M25611 Stiffness of right shoulder, not elsewhere classified: Secondary | ICD-10-CM

## 2014-08-15 DIAGNOSIS — M5489 Other dorsalgia: Secondary | ICD-10-CM

## 2014-08-15 DIAGNOSIS — E8989 Other postprocedural endocrine and metabolic complications and disorders: Secondary | ICD-10-CM | POA: Diagnosis not present

## 2014-08-15 DIAGNOSIS — M6281 Muscle weakness (generalized): Secondary | ICD-10-CM

## 2014-08-15 DIAGNOSIS — M62838 Other muscle spasm: Secondary | ICD-10-CM

## 2014-08-15 DIAGNOSIS — R293 Abnormal posture: Secondary | ICD-10-CM

## 2014-08-15 NOTE — Therapy (Signed)
[place], [place], [address] Phone: [phone]   Fax:  [phone]  Physical Therapy Treatment  Patient Details  Name: Karen Hopkins MRN: [id number] Date of Birth: 08-Nov-1975 Referring Provider:  [name], MD  Encounter Date: 08/15/2014      PT End of Session - 08/15/14 1203    Visit Number (p) 3   Number of Visits (p) 16   Date for PT Re-Evaluation (p) 09/10/14   PT Start Time (p) 1100   PT Stop Time (p) 1203   PT Time Calculation (min) (p) 63 min   Activity Tolerance (p) Patient tolerated treatment well      Past Medical History  Diagnosis Date  . Seasonal allergies   . Lymphedema of arm     right; no BP or puncture to right arm  . Depression   . Anxiety   . Sinus headache   . Hypertension     under control with med., has been on med. x 2 yr.  . History of breast cancer 2011    right  . History of chemotherapy 2011  . History of radiation therapy 2011    Past Surgical History  Procedure Laterality Date  . Supra-umbilical hernia  9767  . Nasal septum surgery    . Latissimus flap to breast Right 03/12/2013    Procedure: RIGHT BREAST LATISSIMUS FLAP WITH EXPANDER PLACEMENT;  Surgeon: Theodoro Kos, DO;  Location: [place];  Service: Plastics;  Laterality: Right;  . Cesarean section  07/15/2001; 03/18/2004; 11/21/2008  . Tubal ligation  11/21/2008  . Portacath placement Left 09/12/2009  . Modified radical mastectomy Right 03/11/2010  . Port-a-cath removal Left 12/01/2010  . Removal of tissue expander and placement of implant Right 08/23/2013    Procedure: REMOVAL RIGHT TISSUE EXPANDER AND PLACEMENT OF IMPLANT TO RIGHT BREAST ;  Surgeon: Theodoro Kos, DO;  Location: [place];  Service: Plastics;  Laterality: Right;  . Breast reduction surgery Left 08/23/2013    Procedure: LEFT BREAST REDUCTION  ;  Surgeon: Theodoro Kos, DO;  Location: [place];  Service:  Plastics;  Laterality: Left;  . Liposuction Bilateral 08/23/2013    Procedure: LIPOSUCTION;  Surgeon: Theodoro Kos, DO;  Location: [place];  Service: Plastics;  Laterality: Bilateral;    There were no vitals filed for this visit.  Visit Diagnosis:  Abnormal posture  Muscle spasms of neck  Myofascial muscle pain  Shoulder joint stiffness, right  Muscle weakness of right upper extremity  Stiffness of joint, shoulder region, right  Right-sided back pain, unspecified location      Subjective Assessment - 08/15/14 1101    Symptoms Thought the dry needling was helpful.  No increase in swelling.  Tolerated prone position just fine.  Right got looser than the left.  LBP.   No headaches this week.     Pertinent History Right breast cancer with mastectomy 2011; reconstruction; left breast reduction; h/o right UE lymphedema and right upper quadrant pain, treated in this clinic several times before.  Recent scare with left breeast, but but ultrasound and mammogram were negative.   Currently in Pain? Yes   Pain Score 6    Pain Location Neck   Pain Orientation Right;Left   Pain Type Chronic pain   Pain Onset More than a month ago   Pain Frequency Constant   Aggravating Factors  reaching  [facility] [facility] Adult PT Treatment/Exercise - 08/15/14 1154    Shoulder Exercises: Supine   Flexion --  bent arm lifts 10x   Other Supine Exercises --  supine cane press and flexion 10x   Shoulder Exercises: Seated   Flexion 10 reps  table slides for flexion and ER   Other Seated Exercises --  verbally discussed thoracic extension over basketball in cha   Modalities   Modalities Moist Heat   Moist Heat Therapy   Number Minutes Moist Heat 10 Minutes   Moist Heat Location Shoulder  right prone   Manual Therapy   Manual Therapy Other (comment)   Joint Mobilization Seated extension mobs grade 3 10x   Myofascial Release Right upper trap and levator and  rhomboid   Scapular Mobilization scapular med/lat/sup/inf glides and distraction grade 3/4   Other Manual Therapy upper trap strapping tape for inhibition; patient instructed in <24 hour wear time and removal   Neck Exercises: Stretches   Upper Trapezius Stretch --  review from previous   Levator Stretch --  review          Trigger Point Dry Needling - 08/15/14 1200    Consent Given? Yes   Muscles Treated Upper Body Upper trapezius;Levator scapulae;Oblique capitus;Suboccipitals muscle group   Upper Trapezius Response Twitch reponse elicited;Palpable increased muscle length   Oblique Capitus Response Palpable increased muscle length   SubOccipitals Response Palpable increased muscle length   Levator Scapulae Response Twitch response elicited;Palpable increased muscle length                PT Short Term Goals - 08/15/14 1655    PT SHORT TERM GOAL #1   Title Patient will report a 25% reduction in pain with usual daily activities and home chores.   Time 4   Period Weeks   Status On-going   PT SHORT TERM GOAL #2   Title Patient will be able to resume a basic home ex program with minimal increase in pain    Time 4   Period Weeks   Status On-going   PT SHORT TERM GOAL #3   Title Right shoulder flexion improved to 92 degrees needed for reaching shoulder height shelves.   Time 4   Period Weeks   Status On-going   PT SHORT TERM GOAL #4   Title Quick DASH score improved from 70.5 "severe difficulty" with ADLs level to 60 indicating improved function with less pain with grooming, household chores and sleep.     Time 4   Period Weeks   Status On-going           PT Long Term Goals - 08/15/14 1656    PT LONG TERM GOAL #1   Title Independent with progressive HEP for further ROM, strengthening of Right UE.   Time 8   Period Weeks   Status On-going   PT LONG TERM GOAL #2   Title Right shoulder flexion and abduction AROM improved to 105 degrees needed for reaching eye  level shelves in the kitchen.     Time 8   Period Weeks   Status On-going   PT LONG TERM GOAL #3   Title Right UE strength grossly 3+/5 needed for basic cooking/cleaning for her 3 children.     Time 8   Period Weeks   Status On-going   PT LONG TERM GOAL #4   Title Quick DASH score improved to 50 indicating "moderate difficulty" with basic ADLS rather than "severe difficulty'"   Time 8  Period Weeks   Status On-going   PT LONG TERM GOAL #5   Title Patient will report overall improvement in pain level and function at 50%.   Time 8   Period Weeks   Status On-going           Long Term Clinic Goals - 06/10/14 1047    CC Long Term Goal  #1   Title Pt. will report at least 50% decrease in right thoracic and lumbar back pain.  Pt reports 20% improvement   Status Partially Met            Plan - 08/15/14 1652    Clinical Impression Statement Patient tolerated needling well with improved muscle length following.  Patient receptive to manual therapy especially thoracic extension as well as shoulder AAROM exercises (cane flexion and table slides).  Upper trap compensatory shoulder hike present.  Patient states upper trap inhibition taping "feels good."     PT Next Visit Plan Dry needling to right upper trap, levator, rhomboids, infraspinatus, subscapularis; manual therapy; assess response to  taping to inhibit overuse of upper trap; recheck shoulder ROM with cues to decrease shoulder hike        Problem List Patient Active Problem List   Diagnosis Date Noted  . Breast cancer, right breast 05/24/2014  . Myalgia and myositis 05/17/2014  . Neoplasm related pain 03/12/2014  . CN (constipation) 11/26/2013  . H/O reduction mammoplasty 10/30/2013  . Seizure 10/06/2013  . Status post bilateral breast implants 08/31/2013  . Acquired absence of breast and absent nipple 08/23/2013  . S/P breast reconstruction, right 08/23/2013  . AC joint arthropathy 07/02/2013  . Routine adult  health maintenance 05/29/2013  . Hypokalemia 05/21/2013  . Chronic pain 04/19/2013  . Elevated blood sugar 03/09/2013  . Wheezing 02/22/2013  . Acute bronchitis 02/22/2013  . Lymphedema 05/24/2011  . RHINOSINUSITIS, CHRONIC 04/08/2010  . Obesity 08/04/2006  . DEPRESSIVE DISORDER, NOS 08/04/2006  . RHINITIS, ALLERGIC 08/04/2006    Alvera Singh 08/15/2014, 4:58 PM  [facility] [address] [place], [place], [address] Phone: [phone]   Fax:  [phone]   Ruben Im, PT 08/15/2014 4:58 PM Phone: [phone] Fax: [phone]

## 2014-08-19 ENCOUNTER — Ambulatory Visit: Payer: Medicare Other | Admitting: Physical Therapy

## 2014-08-19 DIAGNOSIS — E8989 Other postprocedural endocrine and metabolic complications and disorders: Secondary | ICD-10-CM

## 2014-08-19 DIAGNOSIS — R293 Abnormal posture: Secondary | ICD-10-CM

## 2014-08-19 DIAGNOSIS — M25611 Stiffness of right shoulder, not elsewhere classified: Secondary | ICD-10-CM

## 2014-08-19 DIAGNOSIS — M7918 Myalgia, other site: Secondary | ICD-10-CM

## 2014-08-19 DIAGNOSIS — M5489 Other dorsalgia: Secondary | ICD-10-CM

## 2014-08-19 DIAGNOSIS — M62838 Other muscle spasm: Secondary | ICD-10-CM

## 2014-08-19 DIAGNOSIS — I890 Lymphedema, not elsewhere classified: Secondary | ICD-10-CM

## 2014-08-19 DIAGNOSIS — M6281 Muscle weakness (generalized): Secondary | ICD-10-CM

## 2014-08-19 NOTE — Therapy (Signed)
[place], [place], [address] Phone: [phone]   Fax:  [phone]  Physical Therapy Treatment  Patient Details  Name: Karen Hopkins MRN: [id number] Date of Birth: 07/27/1975 Referring Provider:  [name], MD  Encounter Date: 08/19/2014      PT End of Session - 08/19/14 1245    Visit Number 4   Number of Visits 16   Date for PT Re-Evaluation 09/10/14   PT Start Time 0350   PT Stop Time 1250   PT Time Calculation (min) 57 min   Activity Tolerance Patient tolerated treatment well   Behavior During Therapy [facility] for tasks assessed/performed      Past Medical History  Diagnosis Date  . Seasonal allergies   . Lymphedema of arm     right; no BP or puncture to right arm  . Depression   . Anxiety   . Sinus headache   . Hypertension     under control with med., has been on med. x 2 yr.  . History of breast cancer 2011    right  . History of chemotherapy 2011  . History of radiation therapy 2011    Past Surgical History  Procedure Laterality Date  . Supra-umbilical hernia  0938  . Nasal septum surgery    . Latissimus flap to breast Right 03/12/2013    Procedure: RIGHT BREAST LATISSIMUS FLAP WITH EXPANDER PLACEMENT;  Surgeon: Theodoro Kos, DO;  Location: [place];  Service: Plastics;  Laterality: Right;  . Cesarean section  07/15/2001; 03/18/2004; 11/21/2008  . Tubal ligation  11/21/2008  . Portacath placement Left 09/12/2009  . Modified radical mastectomy Right 03/11/2010  . Port-a-cath removal Left 12/01/2010  . Removal of tissue expander and placement of implant Right 08/23/2013    Procedure: REMOVAL RIGHT TISSUE EXPANDER AND PLACEMENT OF IMPLANT TO RIGHT BREAST ;  Surgeon: Theodoro Kos, DO;  Location: [place];  Service: Plastics;  Laterality: Right;  . Breast reduction surgery Left 08/23/2013    Procedure: LEFT BREAST REDUCTION  ;  Surgeon: Theodoro Kos, DO;  Location: [place];  Service: Plastics;  Laterality: Left;  . Liposuction Bilateral 08/23/2013    Procedure: LIPOSUCTION;  Surgeon: Theodoro Kos, DO;  Location: [place];  Service: Plastics;  Laterality: Bilateral;    There were no vitals filed for this visit.  Visit Diagnosis:  Abnormal posture  Muscle spasms of neck  Myofascial muscle pain  Shoulder joint stiffness, right  Muscle weakness of right upper extremity  Stiffness of joint, shoulder region, right  Right-sided back pain, unspecified location  Lymphedema of upper extremity following lymphadenectomy      Subjective Assessment - 08/19/14 1158    Symptoms I love the dry needling. I went to the gym and treadmill before I came here.  I liked the tape for my posture.  After thedry needling I feel better.  but I still return to 6/10 pain .  I actually have feeling over my scar and I havent before.   Pertinent History Right breast cancer with mastectomy 2011; reconstruction; left breast reduction; h/o right UE lymphedema and right upper quadrant pain, treated in this clinic several times before.  Recent scare with left breeast, but but ultrasound and mammogram were negative.   Currently in Pain? Yes   Pain Score 6    Pain Location Neck   Pain Orientation Right   Pain Type Chronic pain   Pain Onset  More than a month ago   Pain Frequency Intermittent            [facility] PT Assessment - 08/19/14 0001    AROM   Right Shoulder Flexion 106 Degrees   Right Shoulder ABduction 99 Degrees   Right Shoulder Internal Rotation --  thumb to L5   Right Shoulder External Rotation 68 Degrees  ERP   Left Shoulder Flexion 150 Degrees   Left Shoulder ABduction 160 Degrees   Left Shoulder Internal Rotation --  thumb to T 6   Left Shoulder External Rotation 70 Degrees   Cervical Flexion 35   Cervical Extension 50   Cervical - Right Side Bend 45   Cervical - Left Side Bend 30  ERP onRight   Cervical - Right  Rotation 55   Cervical - Left Rotation 60   Palpation   Palpation Pt with decreased tension in Upper trap and Levator and over scar tissue                    [facility] Adult PT Treatment/Exercise - 08/19/14 0001    Shoulder Exercises: Supine   Flexion --   Other Supine Exercises --   Shoulder Exercises: Seated   Flexion --   Other Seated Exercises --   Modalities   Modalities Moist Heat   Moist Heat Therapy   Number Minutes Moist Heat 15 Minutes   Moist Heat Location Shoulder  and upper back in supine Rt shoulder   Manual Therapy   Manual Therapy Other (comment)   Joint Mobilization prone PA mobs T-3 to T-10   Myofascial Release Right upper trap and levator and rhomboid   Scapular Mobilization scapular med/lat/sup/inf glides and distraction grade 3/4   Other Manual Therapy upper trap strapping tape for inhibition; patient instructed in <24 hour wear time and removalas instructed before   Neck Exercises: Stretches   Upper Trapezius Stretch --  review from previous   Levator Stretch --  review better able to do after DN          Trigger Point Dry Needling - 08/19/14 1207    Consent Given? Yes   Education Handout Provided No  precautian given previously    Muscles Treated Upper Body Upper trapezius;Oblique capitus;Suboccipitals muscle group;Levator scapulae;Rhomboids;Supraspinatus;Infraspinatus;Subscapularis  DN over R scar tissue only-  pt with feeling over numb area   Upper Trapezius Response Twitch reponse elicited;Palpable increased muscle length  right   Oblique Capitus Response --  right   SubOccipitals Response Palpable increased muscle length  bil   Levator Scapulae Response Twitch response elicited;Palpable increased muscle length   Rhomboids Response Twitch response elicited  right side only   Supraspinatus Response Twitch response elicited  right only   Infraspinatus Response Twitch response elicited  right only   Subscapularis Response Twitch  response elicited  right only              PT Education - 08/19/14 1245    Education provided Yes   Education Details Pt reviewed levator and trap stretch post dry needling.  and relaxed shoulder without elevation   Tipps(s) Educated Patient   Methods Explanation;Demonstration;Verbal cues   Comprehension Verbalized understanding;Returned demonstration;Verbal cues required          PT Short Term Goals - 08/19/14 1205    PT SHORT TERM GOAL #1   Title Patient will report a 25% reduction in pain with usual daily activities and home chores.  Pain still at 6/10 but looser  Time 4   Period Weeks   Status On-going   PT SHORT TERM GOAL #2   Title Patient will be able to resume a basic home ex program with minimal increase in pain    Time 4   Period Weeks   Status Achieved   PT SHORT TERM GOAL #3   Title Right shoulder flexion improved to 92 degrees needed for reaching shoulder height shelves.   Time 4   Period Weeks   Status Achieved   PT SHORT TERM GOAL #4   Title Quick DASH score improved from 70.5 "severe difficulty" with ADLs level to 60 indicating improved function with less pain with grooming, household chores and sleep.     Time 4   Period Weeks   Status On-going           PT Long Term Goals - 08/19/14 1206    PT LONG TERM GOAL #1   Title Independent with progressive HEP for further ROM, strengthening of Right UE.   Time 8   Period Weeks   Status On-going   PT LONG TERM GOAL #2   Title Right shoulder flexion and abduction AROM improved to 105 degrees needed for reaching eye level shelves in the kitchen.    Pt reaches 106 in flexion but too painful for ADL's and reaching at present   Period Weeks   Status On-going   PT LONG TERM GOAL #3   Title Right UE strength grossly 3+/5 needed for basic cooking/cleaning for her 3 children.     Time 8   Period Weeks   Status On-going   PT LONG TERM GOAL #4   Title Quick DASH score improved to 50 indicating  "moderate difficulty" with basic ADLS rather than "severe difficulty'"   Time 8   Period Weeks   Status On-going   PT LONG TERM GOAL #5   Title Patient will report overall improvement in pain level and function at 50%.   Time 8   Period Weeks   Status On-going           Long Term Clinic Goals - 06/10/14 1047    CC Long Term Goal  #1   Title Pt. will report at least 50% decrease in right thoracic and lumbar back pain.  Pt reports 20% improvement   Status Partially Met            Plan - 08/19/14 1247    Clinical Impression Statement  Pt tolerating dry needling well and kinesiotape for Upper trap inhibition.  Pt Right shoulder flex increased to 106 and abd to 99 but still with end range pain.  Pt continues to benefit from dry needling and manual therapy for maximzing AROM  and pain management .Pt seems to appreciate KT tape for proprioceptive input for shoulder positon   Pt will benefit from skilled therapeutic intervention in order to improve on the following deficits Decreased strength;Pain;Impaired UE functional use;Difficulty walking;Impaired flexibility;Decreased range of motion   Rehab Potential Good   Clinical Impairments Affecting Rehab Potential lymphedema   PT Frequency 2x / week   PT Duration 8 weeks   PT Next Visit Plan continue Dry needling  and assess shoulder hike and motor control of shoulder at rest.   continue pain management over scar tissue        Problem List Patient Active Problem List   Diagnosis Date Noted  . Breast cancer, right breast 05/24/2014  . Myalgia and myositis 05/17/2014  . Neoplasm related pain  03/12/2014  . CN (constipation) 11/26/2013  . H/O reduction mammoplasty 10/30/2013  . Seizure 10/06/2013  . Status post bilateral breast implants 08/31/2013  . Acquired absence of breast and absent nipple 08/23/2013  . S/P breast reconstruction, right 08/23/2013  . AC joint arthropathy 07/02/2013  . Routine adult health maintenance  05/29/2013  . Hypokalemia 05/21/2013  . Chronic pain 04/19/2013  . Elevated blood sugar 03/09/2013  . Wheezing 02/22/2013  . Acute bronchitis 02/22/2013  . Lymphedema 05/24/2011  . RHINOSINUSITIS, CHRONIC 04/08/2010  . Obesity 08/04/2006  . DEPRESSIVE DISORDER, NOS 08/04/2006  . RHINITIS, ALLERGIC 08/04/2006   Voncille Lo, PT 08/19/2014 1:01 PM Phone: [phone] Fax: [facility] [place], [place], [address] Phone: [phone]   Fax:  [phone]

## 2014-08-21 ENCOUNTER — Other Ambulatory Visit: Payer: Self-pay | Admitting: Physical Medicine & Rehabilitation

## 2014-08-22 ENCOUNTER — Ambulatory Visit: Payer: Medicare Other | Admitting: Physical Therapy

## 2014-08-22 DIAGNOSIS — M5489 Other dorsalgia: Secondary | ICD-10-CM

## 2014-08-22 DIAGNOSIS — M25611 Stiffness of right shoulder, not elsewhere classified: Secondary | ICD-10-CM

## 2014-08-22 DIAGNOSIS — R293 Abnormal posture: Secondary | ICD-10-CM

## 2014-08-22 DIAGNOSIS — M7918 Myalgia, other site: Secondary | ICD-10-CM

## 2014-08-22 DIAGNOSIS — M6281 Muscle weakness (generalized): Secondary | ICD-10-CM

## 2014-08-22 DIAGNOSIS — M62838 Other muscle spasm: Secondary | ICD-10-CM

## 2014-08-22 DIAGNOSIS — E8989 Other postprocedural endocrine and metabolic complications and disorders: Secondary | ICD-10-CM | POA: Diagnosis not present

## 2014-08-22 NOTE — Patient Instructions (Signed)
Supine theraband yellow strengthening per handout 10x 1x/day, 7 days a week

## 2014-08-23 NOTE — Therapy (Signed)
[place], [place], [address] Phone: [phone]   Fax:  [phone]  Physical Therapy Treatment  Patient Details  Name: Karen Hopkins MRN: [id number] Date of Birth: 10/17/1975 Referring Provider:  [name], MD  Encounter Date: 08/22/2014      PT End of Session - 08/22/14 1245    Visit Number 5   Number of Visits 16   Date for PT Re-Evaluation 09/10/14   PT Start Time 9390   PT Stop Time 1245   PT Time Calculation (min) 60 min   Activity Tolerance Patient tolerated treatment well      Past Medical History  Diagnosis Date  . Seasonal allergies   . Lymphedema of arm     right; no BP or puncture to right arm  . Depression   . Anxiety   . Sinus headache   . Hypertension     under control with med., has been on med. x 2 yr.  . History of breast cancer 2011    right  . History of chemotherapy 2011  . History of radiation therapy 2011    Past Surgical History  Procedure Laterality Date  . Supra-umbilical hernia  3009  . Nasal septum surgery    . Latissimus flap to breast Right 03/12/2013    Procedure: RIGHT BREAST LATISSIMUS FLAP WITH EXPANDER PLACEMENT;  Surgeon: Theodoro Kos, DO;  Location: [facility];  Service: Plastics;  Laterality: Right;  . Cesarean section  07/15/2001; 03/18/2004; 11/21/2008  . Tubal ligation  11/21/2008  . Portacath placement Left 09/12/2009  . Modified radical mastectomy Right 03/11/2010  . Port-a-cath removal Left 12/01/2010  . Removal of tissue expander and placement of implant Right 08/23/2013    Procedure: REMOVAL RIGHT TISSUE EXPANDER AND PLACEMENT OF IMPLANT TO RIGHT BREAST ;  Surgeon: Theodoro Kos, DO;  Location: [facility];  Service: Plastics;  Laterality: Right;  . Breast reduction surgery Left 08/23/2013    Procedure: LEFT BREAST REDUCTION  ;  Surgeon: Theodoro Kos, DO;  Location: [facility];  Service: Plastics;  Laterality: Left;   . Liposuction Bilateral 08/23/2013    Procedure: LIPOSUCTION;  Surgeon: Theodoro Kos, DO;  Location: [facility];  Service: Plastics;  Laterality: Bilateral;    There were no vitals filed for this visit.  Visit Diagnosis:  Abnormal posture  Muscle spasms of neck  Myofascial muscle pain  Shoulder joint stiffness, right  Muscle weakness of right upper extremity  Stiffness of joint, shoulder region, right  Right-sided back pain, unspecified location      Subjective Assessment - 08/22/14 1148    Symptoms Patient presents with sleeve on.  Sore following the needling but nothing bad.  Patient reports she can sit up straighter now.  The muscles aren't as hard as they were.  I went to the gym last night and did the treadmill, tried the glider but I'm not ready for that.     Currently in Pain? Yes   Pain Score 5    Pain Location Neck   Pain Orientation Right  scapular medial border   Pain Descriptors / Indicators Sore   Pain Type Chronic pain                       [facility] Adult PT Treatment/Exercise - 08/22/14 1236    Shoulder Exercises: Supine   External Rotation Right;Left;Both;10 reps   Flexion Both;10 reps  yellow band with  narrow and wide grip   ABduction Both;10 reps  yellow band   Other Supine Exercises seatbelt yellow band 10x right/left   Shoulder Exercises: Pulleys   Flexion 1 minute  verbal cues to decrease shoulder hike   Moist Heat Therapy   Number Minutes Moist Heat 12 Minutes   Moist Heat Location Shoulder   Manual Therapy   Manual Therapy Manual Traction;Other (comment)   Myofascial Release Right upper trap and levator and rhomboid  contract/relax upper traps 3x 5 sec holds in supine and side   Scapular Mobilization scapular med/lat/sup/inf glides and distraction grade 3/4   Manual Traction 3x 30 sec          Trigger Point Dry Needling - 08/22/14 1243    Consent Given? Yes   Muscles Treated Upper Body Upper  trapezius;Levator scapulae;Infraspinatus   Upper Trapezius Response Twitch reponse elicited;Palpable increased muscle length   Levator Scapulae Response Twitch response elicited;Palpable increased muscle length   Infraspinatus Response Palpable increased muscle length              PT Education - 08/22/14 1244    Education provided Yes   Education Details Supine yellow band exercises   Beyene(s) Educated Patient   Methods Explanation;Demonstration;Handout   Comprehension Verbalized understanding;Returned demonstration          PT Short Term Goals - 08/23/14 0925    PT SHORT TERM GOAL #1   Title Patient will report a 25% reduction in pain with usual daily activities and home chores.   Time 4   Period Weeks   Status On-going   PT SHORT TERM GOAL #2   Title Patient will be able to resume a basic home ex program with minimal increase in pain    Status Achieved   PT SHORT TERM GOAL #3   Title Right shoulder flexion improved to 92 degrees needed for reaching shoulder height shelves.   Status Achieved   PT SHORT TERM GOAL #4   Title Quick DASH score improved from 70.5 "severe difficulty" with ADLs level to 60 indicating improved function with less pain with grooming, household chores and sleep.     Time 4   Period Weeks   Status On-going           PT Long Term Goals - 08/23/14 0926    PT LONG TERM GOAL #1   Title Independent with progressive HEP for further ROM, strengthening of Right UE.   Time 8   Period Weeks   Status On-going   PT LONG TERM GOAL #2   Title Right shoulder flexion and abduction AROM improved to 105 degrees needed for reaching eye level shelves in the kitchen.     Time 8   Period Weeks   Status On-going   PT LONG TERM GOAL #3   Title Right UE strength grossly 3+/5 needed for basic cooking/cleaning for her 3 children.     Time 8   Period Weeks   Status On-going   PT LONG TERM GOAL #4   Title Quick DASH score improved to 50 indicating "moderate  difficulty" with basic ADLS rather than "severe difficulty'"   Time 8   Period Weeks   Status On-going   PT LONG TERM GOAL #5   Title Patient will report overall improvement in pain level and function at 50%.   Time 8   Period Weeks   Status On-going           Long Term Clinic Goals - 06/10/14  [place] Term Goal  #1   Title Pt. will report at least 50% decrease in right thoracic and lumbar back pain.  Pt reports 20% improvement   Status Partially Met            Plan - 08/22/14 1256    Clinical Impression Statement Patient with much improved soft tissue length post treatment session.  Needs verbal and tactile cues to decrease compensatory shoulder hike.  She likes the upper trap inhibition taping but declines today to give her skin "a break."    Better control but quick muscle fatigue with supine yellow band exercises with only 10 reps.  Patient is very motivated and receptive to education.  Patient asks about dry needling to axillary and anterior shoulder/chest muscles but after conferring with her Cancer rehab PT, Helene Kelp, we agreed the potential risks would out weigh the benefits in this area.     PT Next Visit Plan continue dry needling as needed to posterior shoulder and periscapular muscles; manual interventions, shoulder ROM and strengthening; recheck AROM and progress toward goals.        Problem List Patient Active Problem List   Diagnosis Date Noted  . Breast cancer, right breast 05/24/2014  . Myalgia and myositis 05/17/2014  . Neoplasm related pain 03/12/2014  . CN (constipation) 11/26/2013  . H/O reduction mammoplasty 10/30/2013  . Seizure 10/06/2013  . Status post bilateral breast implants 08/31/2013  . Acquired absence of breast and absent nipple 08/23/2013  . S/P breast reconstruction, right 08/23/2013  . AC joint arthropathy 07/02/2013  . Routine adult health maintenance 05/29/2013  . Hypokalemia 05/21/2013  . Chronic pain 04/19/2013  . Elevated  blood sugar 03/09/2013  . Wheezing 02/22/2013  . Acute bronchitis 02/22/2013  . Lymphedema 05/24/2011  . RHINOSINUSITIS, CHRONIC 04/08/2010  . Obesity 08/04/2006  . DEPRESSIVE DISORDER, NOS 08/04/2006  . RHINITIS, ALLERGIC 08/04/2006    Alvera Singh 08/23/2014, 9:28 AM  [facility] [address] [place], [place], [address] Phone: [phone]   Fax:  [phone]    Ruben Im, PT 08/23/2014 9:29 AM Phone: [phone] Fax: [phone]

## 2014-08-29 ENCOUNTER — Ambulatory Visit: Payer: Medicare Other | Admitting: Physical Therapy

## 2014-08-29 DIAGNOSIS — M7918 Myalgia, other site: Secondary | ICD-10-CM

## 2014-08-29 DIAGNOSIS — M25611 Stiffness of right shoulder, not elsewhere classified: Secondary | ICD-10-CM

## 2014-08-29 DIAGNOSIS — M62838 Other muscle spasm: Secondary | ICD-10-CM

## 2014-08-29 DIAGNOSIS — R293 Abnormal posture: Secondary | ICD-10-CM

## 2014-08-29 DIAGNOSIS — E8989 Other postprocedural endocrine and metabolic complications and disorders: Secondary | ICD-10-CM | POA: Diagnosis not present

## 2014-08-29 DIAGNOSIS — M6281 Muscle weakness (generalized): Secondary | ICD-10-CM

## 2014-08-29 NOTE — Therapy (Signed)
[place], [place], [address] Phone: [phone]   Fax:  [phone]  Physical Therapy Treatment  Patient Details  Name: Karen Hopkins MRN: [id number] Date of Birth: 1976/01/16 Referring Provider:  [name], MD  Encounter Date: 08/29/2014      PT End of Session - 08/29/14 0944    Visit Number 6   Number of Visits 16   Date for PT Re-Evaluation 09/10/14   PT Start Time 0848   PT Stop Time 0944   PT Time Calculation (min) 56 min   Activity Tolerance Patient tolerated treatment well      Past Medical History  Diagnosis Date  . Seasonal allergies   . Lymphedema of arm     right; no BP or puncture to right arm  . Depression   . Anxiety   . Sinus headache   . Hypertension     under control with med., has been on med. x 2 yr.  . History of breast cancer 2011    right  . History of chemotherapy 2011  . History of radiation therapy 2011    Past Surgical History  Procedure Laterality Date  . Supra-umbilical hernia  4599  . Nasal septum surgery    . Latissimus flap to breast Right 03/12/2013    Procedure: RIGHT BREAST LATISSIMUS FLAP WITH EXPANDER PLACEMENT;  Surgeon: Theodoro Kos, DO;  Location: [facility];  Service: Plastics;  Laterality: Right;  . Cesarean section  07/15/2001; 03/18/2004; 11/21/2008  . Tubal ligation  11/21/2008  . Portacath placement Left 09/12/2009  . Modified radical mastectomy Right 03/11/2010  . Port-a-cath removal Left 12/01/2010  . Removal of tissue expander and placement of implant Right 08/23/2013    Procedure: REMOVAL RIGHT TISSUE EXPANDER AND PLACEMENT OF IMPLANT TO RIGHT BREAST ;  Surgeon: Theodoro Kos, DO;  Location: [facility];  Service: Plastics;  Laterality: Right;  . Breast reduction surgery Left 08/23/2013    Procedure: LEFT BREAST REDUCTION  ;  Surgeon: Theodoro Kos, DO;  Location: [facility];  Service: Plastics;  Laterality: Left;   . Liposuction Bilateral 08/23/2013    Procedure: LIPOSUCTION;  Surgeon: Theodoro Kos, DO;  Location: [facility];  Service: Plastics;  Laterality: Bilateral;    There were no vitals filed for this visit.  Visit Diagnosis:  Abnormal posture  Myofascial muscle pain  Shoulder joint stiffness, right  Muscle spasms of neck  Muscle weakness of right upper extremity  Stiffness of joint, shoulder region, right      Subjective Assessment - 08/29/14 0850    Symptoms Reports swelling in both ankles (pitting edema on left ankle).  "Same old, same old routine."  Denies excessive soreness after last treatment.  Went to the gym and my back hurts so I'm going to work with a Clinical research associate.     Currently in Pain? Yes  mainly just stiff   Pain Location Neck   Pain Orientation Right   Pain Type Chronic pain   Pain Onset More than a month ago   Pain Frequency Intermittent   Aggravating Factors  reaching or stretch the wrong way will cause muscle spasms; sleep on right side   Pain Relieving Factors sitting or lying propped up.              [facility] PT Assessment - 08/29/14 0855    AROM   Right Shoulder Flexion 112 Degrees   Right Shoulder ABduction 105 Degrees  Right Shoulder Internal Rotation --  T10   Right Shoulder External Rotation 76 Degrees   Cervical Flexion 20   Cervical Extension 34   Cervical - Right Side Bend 27   Cervical - Left Side Bend 30   Cervical - Right Rotation 40   Cervical - Left Rotation 50                   [facility] Adult PT Treatment/Exercise - 08/29/14 0906    Neck Exercises: Seated   Cervical Rotation Both;5 reps   Lateral Flexion Both;5 reps   Shoulder Exercises: Supine   Horizontal ABduction Weight (lbs) 1# 3:00/9:00 10x   Flexion --  12:00/6:00 10x 1#   Other Supine Exercises seatbelt yellow band 10x right/left   Moist Heat Therapy   Number Minutes Moist Heat 12 Minutes   Moist Heat Location Shoulder   Manual Therapy    Myofascial Release Right upper trap and levator and rhomboid          Trigger Point Dry Needling - 08/29/14 0943    Consent Given? Yes   Muscles Treated Upper Body Upper trapezius;Levator scapulae;Subscapularis;Oblique capitus   Upper Trapezius Response Twitch reponse elicited;Palpable increased muscle length   Oblique Capitus Response Palpable increased muscle length   Levator Scapulae Response Twitch response elicited;Palpable increased muscle length   Subscapularis Response Palpable increased muscle length                PT Short Term Goals - 08/29/14 1033    PT SHORT TERM GOAL #1   Title Patient will report a 25% reduction in pain with usual daily activities and home chores.   Time 4   Period Weeks   Status On-going   PT SHORT TERM GOAL #2   Title Patient will be able to resume a basic home ex program with minimal increase in pain    Status Achieved   PT SHORT TERM GOAL #3   Title Right shoulder flexion improved to 92 degrees needed for reaching shoulder height shelves.   Status Achieved   PT SHORT TERM GOAL #4   Title Quick DASH score improved from 70.5 "severe difficulty" with ADLs level to 60 indicating improved function with less pain with grooming, household chores and sleep.     Time 4   Period Weeks   Status On-going           PT Long Term Goals - 08/29/14 1034    PT LONG TERM GOAL #1   Title Independent with progressive HEP for further ROM, strengthening of Right UE.   Time 8   Period Weeks   Status On-going   PT LONG TERM GOAL #2   Title Right shoulder flexion and abduction AROM improved to 105 degrees needed for reaching eye level shelves in the kitchen.     Status Achieved   PT LONG TERM GOAL #3   Title Right UE strength grossly 3+/5 needed for basic cooking/cleaning for her 3 children.     Time 8   Period Weeks   Status On-going   PT LONG TERM GOAL #4   Title Quick DASH score improved to 50 indicating "moderate difficulty" with basic ADLS  rather than "severe difficulty'"   Time 8   Period Weeks   Status On-going   PT LONG TERM GOAL #5   Title Patient will report overall improvement in pain level and function at 50%.   Time 8   Period Weeks   Status On-going  [facility] Goals - 06/10/14 1047    [facility] Long Term Goal  #1   Title Pt. will report at least 50% decrease in right thoracic and lumbar back pain.  Pt reports 20% improvement   Status Partially Met            Plan - 08/29/14 1029    Clinical Impression Statement Patient with improved right shoulder AROM.  She continues to lack strength in [facility] and scapular muscles contributing to compensatory upper trap shoulder hike to raise her arm.  Also of consequence continued upper trap trigger points and subscap trigger points as well.  Strengthening performed in supine for better form .  Quick muscular fatigue.      PT Next Visit Plan Next visit:  recheck Quick DASH;  check overall % improvement;  Dry needling to right upper trap, cervical and periscapular muscles and manual interventions especially to cervical.  GH and scapular strengthening.  Upper trap inhibition re-tape?        Problem List Patient Active Problem List   Diagnosis Date Noted  . Breast cancer, right breast 05/24/2014  . Myalgia and myositis 05/17/2014  . Neoplasm related pain 03/12/2014  . CN (constipation) 11/26/2013  . H/O reduction mammoplasty 10/30/2013  . Seizure 10/06/2013  . Status post bilateral breast implants 08/31/2013  . Acquired absence of breast and absent nipple 08/23/2013  . S/P breast reconstruction, right 08/23/2013  . AC joint arthropathy 07/02/2013  . Routine adult health maintenance 05/29/2013  . Hypokalemia 05/21/2013  . Chronic pain 04/19/2013  . Elevated blood sugar 03/09/2013  . Wheezing 02/22/2013  . Acute bronchitis 02/22/2013  . Lymphedema 05/24/2011  . RHINOSINUSITIS, CHRONIC 04/08/2010  . Obesity 08/04/2006  . DEPRESSIVE DISORDER, NOS  08/04/2006  . RHINITIS, ALLERGIC 08/04/2006    Alvera Singh 08/29/2014, 10:38 AM  [facility] [address] [place], [place], [address] Phone: [phone]   Fax:  [phone]   Ruben Im, PT 08/29/2014 10:39 AM Phone: [phone] Fax: [phone]

## 2014-09-02 ENCOUNTER — Telehealth: Payer: Self-pay | Admitting: Oncology

## 2014-09-02 ENCOUNTER — Other Ambulatory Visit: Payer: Self-pay | Admitting: *Deleted

## 2014-09-02 DIAGNOSIS — C50911 Malignant neoplasm of unspecified site of right female breast: Secondary | ICD-10-CM

## 2014-09-02 DIAGNOSIS — I890 Lymphedema, not elsewhere classified: Secondary | ICD-10-CM

## 2014-09-02 NOTE — Telephone Encounter (Signed)
Patient called regarding swelling in right arm, using her sleeve.  Noted swelling in feet and stomach as well.  Please advise.

## 2014-09-02 NOTE — Telephone Encounter (Signed)
This RN returned call to pt to discuss symptoms further.  Karen Hopkins states she is taking her " b/p fluid pill " daily but notices that her bp is staying around 150/80 when she is checked at the pain clinic.  Swelling is pitting.  Karen Hopkins states as well that per pain clinic she is receiving " dry needling " to right side - " this is the side of my arm swelling "  Overall pain is much better with current therapy with pt stating she is able to have greater mobility in her right shoulder.  Per discussion this RN informed Karen Hopkins referral will be made to the lymphedema clinic to evaluate increase swelling and possible interaction related to the dry needling.  Pt's b/p and current dose of maxide will be reviewed with MD for any needed changes.

## 2014-09-03 ENCOUNTER — Ambulatory Visit: Payer: Medicare Other | Admitting: Physical Therapy

## 2014-09-03 DIAGNOSIS — E8989 Other postprocedural endocrine and metabolic complications and disorders: Secondary | ICD-10-CM

## 2014-09-03 DIAGNOSIS — I890 Lymphedema, not elsewhere classified: Principal | ICD-10-CM

## 2014-09-03 NOTE — Therapy (Signed)
[place], [place], [address] Phone: [phone]   Fax:  [phone]  Physical Therapy Evaluation  Patient Details  Name: Karen Hopkins MRN: [id number] Date of Birth: 04/25/1976 Referring Provider:  [name], MD  Encounter Date: 09/03/2014      PT End of Session - 09/03/14 1204    Visit Number 7   Number of Visits 16   Date for PT Re-Evaluation 09/10/14   PT Start Time 1100   PT Stop Time 1158   PT Time Calculation (min) 58 min      Past Medical History  Diagnosis Date  . Seasonal allergies   . Lymphedema of arm     right; no BP or puncture to right arm  . Depression   . Anxiety   . Sinus headache   . Hypertension     under control with med., has been on med. x 2 yr.  . History of breast cancer 2011    right  . History of chemotherapy 2011  . History of radiation therapy 2011    Past Surgical History  Procedure Laterality Date  . Supra-umbilical hernia  4315  . Nasal septum surgery    . Latissimus flap to breast Right 03/12/2013    Procedure: RIGHT BREAST LATISSIMUS FLAP WITH EXPANDER PLACEMENT;  Surgeon: Theodoro Kos, DO;  Location: [facility];  Service: Plastics;  Laterality: Right;  . Cesarean section  07/15/2001; 03/18/2004; 11/21/2008  . Tubal ligation  11/21/2008  . Portacath placement Left 09/12/2009  . Modified radical mastectomy Right 03/11/2010  . Port-a-cath removal Left 12/01/2010  . Removal of tissue expander and placement of implant Right 08/23/2013    Procedure: REMOVAL RIGHT TISSUE EXPANDER AND PLACEMENT OF IMPLANT TO RIGHT BREAST ;  Surgeon: Theodoro Kos, DO;  Location: [facility];  Service: Plastics;  Laterality: Right;  . Breast reduction surgery Left 08/23/2013    Procedure: LEFT BREAST REDUCTION  ;  Surgeon: Theodoro Kos, DO;  Location: [facility];  Service: Plastics;  Laterality: Left;  . Liposuction Bilateral 08/23/2013   Procedure: LIPOSUCTION;  Surgeon: Theodoro Kos, DO;  Location: [facility];  Service: Plastics;  Laterality: Bilateral;    There were no vitals filed for this visit.  Visit Diagnosis:  Lymphedema of upper extremity following lymphadenectomy      Subjective Assessment - 09/03/14 1209    Symptoms pt very verbal with frustration that she has been eating better, (including herbalife shakes), working out at [employer] ( treadmill for 15 minutes walking with variable speeds, circuit training in a group class. also is working with a Physiological scientist) but she feels that she is swollen all over. "my arm is huge".  she states she is wearing her sleeve  every day and while she is exercising, and using the Flexitouch every other day.  She has been trying to eat more fruits and vegetables but  has gained at least 10 pounds.  She feel that she is bigger in her abdomen and legs.  She is so frustrated "I could scream!"  She goes to see Dr. Jana Hakim  on April 7   Pertinent History Right breast cancer with mastectomy 2011; reconstruction; left breast reduction; h/o right UE lymphedema and right upper quadrant pain, treated in this clinic several times before.  Recent scare with left breeast, but but ultrasound and mammogram were negative.   Currently in Pain? Yes   Pain Score 7  Pain Location Shoulder   Pain Relieving Factors dry needling.  Pt states her pain is up today because she has only been able to have the needling once this past week.            [facility] PT Assessment - 09/03/14 0001    Assessment   Medical Diagnosis h/o right breast cancer with mastectomy and reconstruction   Onset Date 04/26/14   Precautions   Precautions Other (comment)   Restrictions   Weight Bearing Restrictions No   Balance Screen   Has the patient fallen in the past 6 months Yes   How many times? 1   Has the patient had a decrease in activity level because of a fear of falling?  No   Is the patient  reluctant to leave their home because of a fear of falling?  No   Home Environment   Living Enviornment Private residence   Living Arrangements Children  3 boys   Prior Function   Level of Independence Independent with basic ADLs   Vocation On disability  has assistance 3 times a week   Leisure was working out 3 times a week at [employer], but hasn't gone back this week  because she is swelling    Cognition   Overall Cognitive Status Within Functional Limits for tasks assessed   Observation/Other Assessments   Observations pt appears to have generalized edema, especially at abdomen and she is bothered by edema at feet and ankles to the point that she cannot wear her shoes.    Skin Integrity intact. right arm is not warm or red   Quick DASH  70.45           LYMPHEDEMA/ONCOLOGY QUESTIONNAIRE - 09/03/14 1126    Right Upper Extremity Lymphedema   At Axilla  45 cm   15 cm Proximal to Olecranon Process 51.9 cm   10 cm Proximal to Olecranon Process 51 cm   Olecranon Process 33 cm   15 cm Proximal to Ulnar Styloid Process 33.5 cm   10 cm Proximal to Ulnar Styloid Process 29.5 cm   Just Proximal to Ulnar Styloid Process 18.5 cm   Across Hand at [employer] 21.5 cm   At [place] of 2nd Digit 6.2 cm   Left Upper Extremity Lymphedema   At Axilla  50.5 cm   15 cm Proximal to Olecranon Process 50.5 cm   10 cm Proximal to Olecranon Process 48.3 cm   Olecranon Process 31.5 cm   15 cm Proximal to Ulnar Styloid Process 32 cm   10 cm Proximal to Ulnar Styloid Process 27.5 cm   Just Proximal to Ulnar Styloid Process 18.5 cm   Across Hand at [employer] 21 cm   At [place] of 2nd Digit 6 cm           Quick Dash - 09/03/14 0001    Open a tight or new jar Moderate difficulty   Do heavy household chores (wash walls, wash floors) Moderate difficulty   Carry a shopping bag or briefcase Severe difficulty   Wash your back Severe difficulty   Use a knife to cut food Severe difficulty    Recreational activities in which you take some force or impact through your arm, shoulder, or hand (golf, hammering, tennis) Severe difficulty   During the past week, to what extent has your arm, shoulder or hand problem interfered with your normal social activities with family, friends, neighbors, or groups? Quite a bit  During the past week, to what extent has your arm, shoulder or hand problem limited your work or other regular daily activities Quite a bit   Arm, shoulder, or hand pain. Severe   Tingling (pins and needles) in your arm, shoulder, or hand Severe   Difficulty Sleeping Severe difficulty   DASH Score 70.45 %                     PT Education - 09/03/14 1204    Education provided Yes   Education Details elevate legs and ankle pump while doing flexitouch   [place]it(s) Educated Patient   Methods Explanation   Comprehension Verbalized understanding          PT Short Term Goals - 08/29/14 1033    PT SHORT TERM GOAL #1   Title Patient will report a 25% reduction in pain with usual daily activities and home chores.   Time 4   Period Weeks   Status On-going   PT SHORT TERM GOAL #2   Title Patient will be able to resume a basic home ex program with minimal increase in pain    Status Achieved   PT SHORT TERM GOAL #3   Title Right shoulder flexion improved to 92 degrees needed for reaching shoulder height shelves.   Status Achieved   PT SHORT TERM GOAL #4   Title Quick DASH score improved from 70.5 "severe difficulty" with ADLs level to 60 indicating improved function with less pain with grooming, household chores and sleep.     Time 4   Period Weeks   Status On-going         Short Term Clinic Goals - 09/03/14 1217    CC Short Term Goal  #1   Title short term goals= long term goals          PT Long Term Goals - 08/29/14 1034    PT LONG TERM GOAL #1   Title Independent with progressive HEP for further ROM, strengthening of Right UE.   Time 8    Period Weeks   Status On-going   PT LONG TERM GOAL #2   Title Right shoulder flexion and abduction AROM improved to 105 degrees needed for reaching eye level shelves in the kitchen.     Status Achieved   PT LONG TERM GOAL #3   Title Right UE strength grossly 3+/5 needed for basic cooking/cleaning for her 3 children.     Time 8   Period Weeks   Status On-going   PT LONG TERM GOAL #4   Title Quick DASH score improved to 50 indicating "moderate difficulty" with basic ADLS rather than "severe difficulty'"   Time 8   Period Weeks   Status On-going   PT LONG TERM GOAL #5   Title Patient will report overall improvement in pain level and function at 50%.   Time 8   Period Weeks   Status On-going           Long Term Clinic Goals - 09/03/14 1145    CC Long Term Goal  #4   Title right arm circumference at 15 cm provximal to olecranon will be reduced by 3 sm   Baseline 51.9   Time 4   Period Weeks   Status New            Plan - 09/03/14 1205    Clinical Impression Statement Pt with exacerbation of lymphedema accompanied by generalized swelling per her report ( increased circumferential  measuarements of both arms noted) including abdomen, feet and ankles. She has tried to make significant diet and exercise changes and is frustrated by increased swelling.  She will visit Dr Jana Hakim next week to expore other possible causes, but will focus now on reduction of lymphedema in right arm close to where it was in December.    Pt will benefit from skilled therapeutic intervention in order to improve on the following deficits Increased edema;Impaired UE functional use   Rehab Potential Good   Clinical Impairments Affecting Rehab Potential multiple medical and chronic pain issues   PT Frequency 2x / week   PT Duration 4 weeks   PT Treatment/Interventions Compression bandaging;Manual lymph drainage   PT Next Visit Plan right upper extremity manual lymph drainage and compression wrapping    Recommended Other Services Nutritionist consult for weight loss??   Consulted and Agree with Plan of Care Patient          G-Codes - Sep 11, 2014 September 12, 1200    Functional Assessment Tool Used Quick DASH; clinical judgement   Functional Limitation Carrying, moving and handling objects   Carrying, Moving and Handling Objects Current Status [phone]) At least 60 percent but less than 80 percent impaired, limited or restricted   Carrying, Moving and Handling Objects Goal Status (G8366) At least 60 percent but less than 80 percent impaired, limited or restricted   Carrying, Moving and Handling Objects Discharge Status [phone]) At least 60 percent but less than 80 percent impaired, limited or restricted       Problem List Patient Active Problem List   Diagnosis Date Noted  . Breast cancer, right breast 05/24/2014  . Myalgia and myositis 05/17/2014  . Neoplasm related pain 03/12/2014  . CN (constipation) 11/26/2013  . H/O reduction mammoplasty 10/30/2013  . Seizure 10/06/2013  . Status post bilateral breast implants 08/31/2013  . Acquired absence of breast and absent nipple 08/23/2013  . S/P breast reconstruction, right 08/23/2013  . AC joint arthropathy 07/02/2013  . Routine adult health maintenance 05/29/2013  . Hypokalemia 05/21/2013  . Chronic pain 04/19/2013  . Elevated blood sugar 03/09/2013  . Wheezing 02/22/2013  . Acute bronchitis 02/22/2013  . Lymphedema 05/24/2011  . RHINOSINUSITIS, CHRONIC 04/08/2010  . Obesity 08/04/2006  . DEPRESSIVE DISORDER, NOS 08/04/2006  . RHINITIS, ALLERGIC 08/04/2006   Donato Heinz. Owens Shark, PT  09-11-2014, 12:19 PM  [place], [place], [address] Phone: [phone]   Fax:  [phone]

## 2014-09-04 ENCOUNTER — Ambulatory Visit: Payer: Medicare Other | Admitting: Physical Therapy

## 2014-09-04 DIAGNOSIS — E8989 Other postprocedural endocrine and metabolic complications and disorders: Secondary | ICD-10-CM

## 2014-09-04 DIAGNOSIS — I890 Lymphedema, not elsewhere classified: Principal | ICD-10-CM

## 2014-09-04 NOTE — Patient Instructions (Signed)
[email or web address]  Lymphatic flow series with shoosh [place] consult or with Iver Nestle at [facility]

## 2014-09-04 NOTE — Therapy (Signed)
[place], [place], [address] Phone: [phone]   Fax:  [phone]  Physical Therapy Treatment  Patient Details  Name: Karen Hopkins MRN: [id number] Date of Birth: [birth year] Referring Provider:  [name], MD  Encounter Date: 09/04/2014      PT End of Session - 09/04/14 1221    Visit Number 8   Number of Visits 16   Date for PT Re-Evaluation 09/10/14   PT Start Time 1020   PT Stop Time 1100   PT Time Calculation (min) 40 min      Past Medical History  Diagnosis Date  . Seasonal allergies   . Lymphedema of arm     right; no BP or puncture to right arm  . Depression   . Anxiety   . Sinus headache   . Hypertension     under control with med., has been on med. x 2 yr.  . History of breast cancer 2011    right  . History of chemotherapy 2011  . History of radiation therapy 2011    Past Surgical History  Procedure Laterality Date  . Supra-umbilical hernia  5732  . Nasal septum surgery    . Latissimus flap to breast Right 03/12/2013    Procedure: RIGHT BREAST LATISSIMUS FLAP WITH EXPANDER PLACEMENT;  Surgeon: Theodoro Kos, DO;  Location: [facility];  Service: Plastics;  Laterality: Right;  . Cesarean section  07/15/2001; 03/18/2004; 11/21/2008  . Tubal ligation  11/21/2008  . Portacath placement Left 09/12/2009  . Modified radical mastectomy Right 03/11/2010  . Port-a-cath removal Left 12/01/2010  . Removal of tissue expander and placement of implant Right 08/23/2013    Procedure: REMOVAL RIGHT TISSUE EXPANDER AND PLACEMENT OF IMPLANT TO RIGHT BREAST ;  Surgeon: Theodoro Kos, DO;  Location: [facility];  Service: Plastics;  Laterality: Right;  . Breast reduction surgery Left 08/23/2013    Procedure: LEFT BREAST REDUCTION  ;  Surgeon: Theodoro Kos, DO;  Location: [facility];  Service: Plastics;  Laterality: Left;  . Liposuction Bilateral 08/23/2013    Procedure:  LIPOSUCTION;  Surgeon: Theodoro Kos, DO;  Location: [facility];  Service: Plastics;  Laterality: Bilateral;    There were no vitals filed for this visit.  Visit Diagnosis:  Lymphedema of upper extremity following lymphadenectomy      Subjective Assessment - 09/03/14 1209    Symptoms pt very verbal with frustration that she has been eating better, (including herbalife shakes), working out at [employer] ( treadmill for 15 minutes walking with variable speeds, circuit training in a group class. also is working with a Physiological scientist) but she feels that she is swollen all over. "my arm is huge".  she states she is wearing her sleeve  every day and while she is exercising, and using the Flexitouch every other day.  She has been trying to eat more fruits and vegetables but  has gained at least 10 pounds.  She feel that she is bigger in her abdomen and legs.  She is so frustrated "I could scream!"  She goes to see Dr. Jana Hakim  on April 7   Pertinent History Right breast cancer with mastectomy 2011; reconstruction; left breast reduction; h/o right UE lymphedema and right upper quadrant pain, treated in this clinic several times before.  Recent scare with left breeast, but but ultrasound and mammogram were negative.   Currently in Pain? Yes   Pain Score 7  Pain Location Shoulder   Pain Relieving Factors dry needling.  Pt states her pain is up today because she has only been able to have the needling once this past week.               LYMPHEDEMA/ONCOLOGY QUESTIONNAIRE - 09/03/14 1126    Right Upper Extremity Lymphedema   At Axilla  45 cm   15 cm Proximal to Olecranon Process 51.9 cm   10 cm Proximal to Olecranon Process 51 cm   Olecranon Process 33 cm   15 cm Proximal to Ulnar Styloid Process 33.5 cm   10 cm Proximal to Ulnar Styloid Process 29.5 cm   Just Proximal to Ulnar Styloid Process 18.5 cm   Across Hand at [employer] 21.5 cm   At [place] of 2nd Digit 6.2 cm    Left Upper Extremity Lymphedema   At Axilla  50.5 cm   15 cm Proximal to Olecranon Process 50.5 cm   10 cm Proximal to Olecranon Process 48.3 cm   Olecranon Process 31.5 cm   15 cm Proximal to Ulnar Styloid Process 32 cm   10 cm Proximal to Ulnar Styloid Process 27.5 cm   Just Proximal to Ulnar Styloid Process 18.5 cm   Across Hand at [employer] 21 cm   At [place] of 2nd Digit 6 cm                [facility] Adult PT Treatment/Exercise - 09/04/14 0001    Neck Exercises: Seated   Other Seated Exercise neck and scapulare range of motion to facilitate lymphatic flow   Manual Therapy   Manual Lymphatic Drainage (MLD) brief manual lymph drainage to right upper arm, forearm and hand   Compression Bandaging Biotone lotion applied, then bandaging with stockinette, Elastomull on all fingers one -four , Artiflex x 2, and 1-6 cm., 2-10 cm., and 2-12 cm. bandages from right hand to shoulder.                PT Education - 09/04/14 1043    Education provided Yes   Education Details Lymphatic flow yoga series    Tseng(s) Educated Patient   Methods Other (comment)  youtube line   Comprehension Verbalized understanding          PT Short Term Goals - 08/29/14 1033    PT SHORT TERM GOAL #1   Title Patient will report a 25% reduction in pain with usual daily activities and home chores.   Time 4   Period Weeks   Status On-going   PT SHORT TERM GOAL #2   Title Patient will be able to resume a basic home ex program with minimal increase in pain    Status Achieved   PT SHORT TERM GOAL #3   Title Right shoulder flexion improved to 92 degrees needed for reaching shoulder height shelves.   Status Achieved   PT SHORT TERM GOAL #4   Title Quick DASH score improved from 70.5 "severe difficulty" with ADLs level to 60 indicating improved function with less pain with grooming, household chores and sleep.     Time 4   Period Weeks   Status On-going         Short Term Clinic Goals  - 09/03/14 1217    CC Short Term Goal  #1   Title short term goals= long term goals          PT Long Term Goals - 08/29/14 1034    PT  LONG TERM GOAL #1   Title Independent with progressive HEP for further ROM, strengthening of Right UE.   Time 8   Period Weeks   Status On-going   PT LONG TERM GOAL #2   Title Right shoulder flexion and abduction AROM improved to 105 degrees needed for reaching eye level shelves in the kitchen.     Status Achieved   PT LONG TERM GOAL #3   Title Right UE strength grossly 3+/5 needed for basic cooking/cleaning for her 3 children.     Time 8   Period Weeks   Status On-going   PT LONG TERM GOAL #4   Title Quick DASH score improved to 50 indicating "moderate difficulty" with basic ADLS rather than "severe difficulty'"   Time 8   Period Weeks   Status On-going   PT LONG TERM GOAL #5   Title Patient will report overall improvement in pain level and function at 50%.   Time 8   Period Weeks   Status On-going           Long Term Clinic Goals - 09/04/14 1221/09/10    CC Long Term Goal  #4   Title right arm circumference at 15 cm provximal to olecranon will be reduced by 3 sm   Time 4   Period Weeks   Status On-going            Plan - 09/04/14 1222    Clinical Impression Statement provided initial compression wrapping to right arm this episode.  Pt reported that the bandaging 'always feels good"  pt to have adjustment of Flexitouch tomorrow.,   PT Next Visit Plan right upper extremity manual lymph drainage and compression wrapping          G-Codes - 09-09-2014 1200-09-10    Functional Assessment Tool Used Quick DASH; clinical judgement   Functional Limitation Carrying, moving and handling objects   Carrying, Moving and Handling Objects Current Status (O9735) At least 60 percent but less than 80 percent impaired, limited or restricted   Carrying, Moving and Handling Objects Goal Status (H2992) At least 60 percent but less than 80 percent impaired,  limited or restricted   Carrying, Moving and Handling Objects Discharge Status [phone]) At least 60 percent but less than 80 percent impaired, limited or restricted      Problem List Patient Active Problem List   Diagnosis Date Noted  . Breast cancer, right breast 05/24/2014  . Myalgia and myositis 05/17/2014  . Neoplasm related pain 03/12/2014  . CN (constipation) 11/26/2013  . H/O reduction mammoplasty 10/30/2013  . Seizure 10/06/2013  . Status post bilateral breast implants 08/31/2013  . Acquired absence of breast and absent nipple 08/23/2013  . S/P breast reconstruction, right 08/23/2013  . AC joint arthropathy 07/02/2013  . Routine adult health maintenance 05/29/2013  . Hypokalemia 05/21/2013  . Chronic pain 04/19/2013  . Elevated blood sugar 03/09/2013  . Wheezing 02/22/2013  . Acute bronchitis 02/22/2013  . Lymphedema 05/24/2011  . RHINOSINUSITIS, CHRONIC 04/08/2010  . Obesity 08/04/2006  . DEPRESSIVE DISORDER, NOS 08/04/2006  . RHINITIS, ALLERGIC 08/04/2006   Donato Heinz. Owens Shark, PT 09/04/2014, 12:24 PM  [place], [place], [address] Phone: [phone]   Fax:  [phone]

## 2014-09-05 ENCOUNTER — Ambulatory Visit: Payer: Medicare Other | Admitting: Physical Therapy

## 2014-09-05 DIAGNOSIS — E8989 Other postprocedural endocrine and metabolic complications and disorders: Secondary | ICD-10-CM | POA: Diagnosis not present

## 2014-09-05 DIAGNOSIS — M5489 Other dorsalgia: Secondary | ICD-10-CM

## 2014-09-05 DIAGNOSIS — M25611 Stiffness of right shoulder, not elsewhere classified: Secondary | ICD-10-CM

## 2014-09-05 DIAGNOSIS — M7918 Myalgia, other site: Secondary | ICD-10-CM

## 2014-09-05 DIAGNOSIS — M62838 Other muscle spasm: Secondary | ICD-10-CM

## 2014-09-05 DIAGNOSIS — M6281 Muscle weakness (generalized): Secondary | ICD-10-CM

## 2014-09-05 NOTE — Therapy (Signed)
[place], [place], [address] Phone: [phone]   Fax:  [phone]  Physical Therapy Treatment  Patient Details  Name: Karen Hopkins MRN: [id number] Date of Birth: 1976/02/13 Referring Provider:  [name], MD  Encounter Date: 09/05/2014      PT End of Session - 09/05/14 1805    Visit Number 9   Number of Visits 16   Date for PT Re-Evaluation 09/10/14   PT Start Time 4917   PT Stop Time 1120   PT Time Calculation (min) 65 min   Activity Tolerance Patient tolerated treatment well      Past Medical History  Diagnosis Date  . Seasonal allergies   . Lymphedema of arm     right; no BP or puncture to right arm  . Depression   . Anxiety   . Sinus headache   . Hypertension     under control with med., has been on med. x 2 yr.  . History of breast cancer 2011    right  . History of chemotherapy 2011  . History of radiation therapy 2011    Past Surgical History  Procedure Laterality Date  . Supra-umbilical hernia  9150  . Nasal septum surgery    . Latissimus flap to breast Right 03/12/2013    Procedure: RIGHT BREAST LATISSIMUS FLAP WITH EXPANDER PLACEMENT;  Surgeon: Theodoro Kos, DO;  Location: [facility];  Service: Plastics;  Laterality: Right;  . Cesarean section  07/15/2001; 03/18/2004; 11/21/2008  . Tubal ligation  11/21/2008  . Portacath placement Left 09/12/2009  . Modified radical mastectomy Right 03/11/2010  . Port-a-cath removal Left 12/01/2010  . Removal of tissue expander and placement of implant Right 08/23/2013    Procedure: REMOVAL RIGHT TISSUE EXPANDER AND PLACEMENT OF IMPLANT TO RIGHT BREAST ;  Surgeon: Theodoro Kos, DO;  Location: [facility];  Service: Plastics;  Laterality: Right;  . Breast reduction surgery Left 08/23/2013    Procedure: LEFT BREAST REDUCTION  ;  Surgeon: Theodoro Kos, DO;  Location: [facility];  Service: Plastics;  Laterality: Left;   . Liposuction Bilateral 08/23/2013    Procedure: LIPOSUCTION;  Surgeon: Theodoro Kos, DO;  Location: [facility];  Service: Plastics;  Laterality: Bilateral;    There were no vitals filed for this visit.  Visit Diagnosis:  Myofascial muscle pain  Shoulder joint stiffness, right  Muscle spasms of neck  Muscle weakness of right upper extremity  Stiffness of joint, shoulder region, right  Right-sided back pain, unspecified location      Subjective Assessment - 09/05/14 1017    Symptoms Patient states she spoke with her doctor regarding her lymphedema and they were OK with her continuing dry needling in addition to treatment for lymphedema, "they want me to do both."  "I ran out of muscle relaxers so I'm  hurting a little ."     Currently in Pain? Yes   Pain Score 7    Pain Location Shoulder  scapula and axilla   Pain Orientation Right   Pain Descriptors / Indicators Spasm   Pain Type Chronic pain   Aggravating Factors  not taking muscle relaxers   Pain Relieving Factors Overall 45% better.  States she would say 60% better after dry needling            [facility] PT Assessment - 09/05/14 0001    Observation/Other Assessments   Quick DASH  60  [facility] Adult PT Treatment/Exercise - 09/05/14 1029    Shoulder Exercises: Supine   Flexion Right;15 reps;Weights   Shoulder Flexion Weight (lbs) 1   ABduction Strengthening;Right;15 reps   Theraband Level (Shoulder ABduction) --  HABD   Shoulder ABduction Weight (lbs) 1   Other Supine Exercises supine flexion overhead 1# 15x   Shoulder Exercises: Sidelying   Other Sidelying Exercises abduction 15x and HABD 15x 1 #   Moist Heat Therapy   Number Minutes Moist Heat 12 Minutes   Moist Heat Location Shoulder   Manual Therapy   Myofascial Release Right upper trap and levator and rhomboid   Scapular Mobilization scapular med/lat/sup/inf glides and distraction grade 3/4          Trigger  Point Dry Needling - 09/05/14 1035    Consent Given? Yes   Muscles Treated Upper Body Upper trapezius;Levator scapulae;Rhomboids;Subscapularis;Infraspinatus   Upper Trapezius Response Twitch reponse elicited;Palpable increased muscle length   Oblique Capitus Response --   Levator Scapulae Response Twitch response elicited;Palpable increased muscle length   Rhomboids Response Twitch response elicited;Palpable increased muscle length   Infraspinatus Response Palpable increased muscle length   Subscapularis Response Palpable increased muscle length      Right side only.        PT Education - 09/04/14 1043    Education provided Yes   Education Details Lymphatic flow yoga series    [place](s) Educated Patient   Methods Other (comment)  youtube line   Comprehension Verbalized understanding          PT Short Term Goals - 09/05/14 1813    PT SHORT TERM GOAL #1   Title Patient will report a 25% reduction in pain with usual daily activities and home chores.   Status Achieved   PT SHORT TERM GOAL #2   Title Patient will be able to resume a basic home ex program with minimal increase in pain    Status Achieved   PT SHORT TERM GOAL #3   Title Right shoulder flexion improved to 92 degrees needed for reaching shoulder height shelves.   Status Achieved   PT SHORT TERM GOAL #4   Title Quick DASH score improved from 70.5 "severe difficulty" with ADLs level to 60 indicating improved function with less pain with grooming, household chores and sleep.     Baseline 60   Status Achieved         Short Term Clinic Goals - 09/03/14 1217    CC Short Term Goal  #1   Title short term goals= long term goals          PT Long Term Goals - 09/05/14 1813    PT LONG TERM GOAL #1   Title Independent with progressive HEP for further ROM, strengthening of Right UE.   Time 8   Period Weeks   Status On-going   PT LONG TERM GOAL #2   Title Right shoulder flexion and abduction AROM improved to 105  degrees needed for reaching eye level shelves in the kitchen.     Status Achieved   PT LONG TERM GOAL #3   Title Right UE strength grossly 3+/5 needed for basic cooking/cleaning for her 3 children.     Time 8   Period Weeks   Status On-going   PT LONG TERM GOAL #4   Title Quick DASH score improved to 50 indicating "moderate difficulty" with basic ADLS rather than "severe difficulty'"   Time 8   Period Weeks   Status On-going  PT LONG TERM GOAL #5   Title Patient will report overall improvement in pain level and function at 50%.   Time 8   Period Weeks   Status Partially [place] - 09/04/14 1223    CC Long Term Goal  #4   Title right arm circumference at 15 cm provximal to olecranon will be reduced by 3 sm   Time 4   Period Weeks   Status On-going            Plan - 09/05/14 1806    Clinical Impression Statement Patient with increased spasm and pain today which patient attributes to running out of muscle relaxers.  Patient now being treated for lymphedema as well.  Patient reports excellent improvement in pain following dry needling today and on previous visits.  She states her overall improvement is 45% and following treatment sessions at least 60% better.  Continues to have right upper trap compensatory shoulder hike when lifting arm against gravity therefore focusing on mid and higher range shoulder ROM in supine and sidelying.   Therapist closely monitoring pain and shoulder alignment throughout session.     PT Next Visit Plan Gcode next visit;  reassess AROM and progress toward goals;  recertification;  Quick DASH already completed         Problem List Patient Active Problem List   Diagnosis Date Noted  . Breast cancer, right breast 05/24/2014  . Myalgia and myositis 05/17/2014  . Neoplasm related pain 03/12/2014  . CN (constipation) 11/26/2013  . H/O reduction mammoplasty 10/30/2013  . Seizure 10/06/2013  . Status post bilateral  breast implants 08/31/2013  . Acquired absence of breast and absent nipple 08/23/2013  . S/P breast reconstruction, right 08/23/2013  . AC joint arthropathy 07/02/2013  . Routine adult health maintenance 05/29/2013  . Hypokalemia 05/21/2013  . Chronic pain 04/19/2013  . Elevated blood sugar 03/09/2013  . Wheezing 02/22/2013  . Acute bronchitis 02/22/2013  . Lymphedema 05/24/2011  . RHINOSINUSITIS, CHRONIC 04/08/2010  . Obesity 08/04/2006  . DEPRESSIVE DISORDER, NOS 08/04/2006  . RHINITIS, ALLERGIC 08/04/2006    Alvera Singh 09/05/2014, 6:16 PM  [place], [place], [address] Phone: [phone]   Fax:  [phone]   Ruben Im, [place] 09/05/2014 6:16 PM Phone: [phone] Fax: [phone]

## 2014-09-10 ENCOUNTER — Ambulatory Visit: Payer: Medicare Other | Attending: Physical Medicine & Rehabilitation | Admitting: Physical Therapy

## 2014-09-10 ENCOUNTER — Ambulatory Visit: Payer: Medicare Other

## 2014-09-10 DIAGNOSIS — I890 Lymphedema, not elsewhere classified: Secondary | ICD-10-CM | POA: Diagnosis present

## 2014-09-10 DIAGNOSIS — M62838 Other muscle spasm: Secondary | ICD-10-CM

## 2014-09-10 DIAGNOSIS — R293 Abnormal posture: Secondary | ICD-10-CM

## 2014-09-10 DIAGNOSIS — E8989 Other postprocedural endocrine and metabolic complications and disorders: Secondary | ICD-10-CM | POA: Diagnosis not present

## 2014-09-10 DIAGNOSIS — M6281 Muscle weakness (generalized): Secondary | ICD-10-CM

## 2014-09-10 DIAGNOSIS — M5489 Other dorsalgia: Secondary | ICD-10-CM

## 2014-09-10 DIAGNOSIS — M25611 Stiffness of right shoulder, not elsewhere classified: Secondary | ICD-10-CM | POA: Insufficient documentation

## 2014-09-10 DIAGNOSIS — M7918 Myalgia, other site: Secondary | ICD-10-CM

## 2014-09-10 NOTE — Therapy (Signed)
[place], [place], [address] Phone: [phone]   Fax:  [phone]  Physical Therapy Treatment  Patient Details  Name: Karen Hopkins MRN: [id number] Date of Birth: July 03, 1975 Referring Provider:  [name], MD  Encounter Date: 09/10/2014      PT End of Session - 09/10/14 1211    Visit Number 11   Number of Visits 28   Date for PT Re-Evaluation 10/22/14   PT Start Time 1100   PT Stop Time 1155   PT Time Calculation (min) 55 min   Activity Tolerance Patient tolerated treatment well;Patient limited by pain   Behavior During Therapy [facility] for tasks assessed/performed      Past Medical History  Diagnosis Date  . Seasonal allergies   . Lymphedema of arm     right; no BP or puncture to right arm  . Depression   . Anxiety   . Sinus headache   . Hypertension     under control with med., has been on med. x 2 yr.  . History of breast cancer 2011    right  . History of chemotherapy 2011  . History of radiation therapy 2011    Past Surgical History  Procedure Laterality Date  . Supra-umbilical hernia  5916  . Nasal septum surgery    . Latissimus flap to breast Right 03/12/2013    Procedure: RIGHT BREAST LATISSIMUS FLAP WITH EXPANDER PLACEMENT;  Surgeon: Theodoro Kos, DO;  Location: [facility];  Service: Plastics;  Laterality: Right;  . Cesarean section  07/15/2001; 03/18/2004; 11/21/2008  . Tubal ligation  11/21/2008  . Portacath placement Left 09/12/2009  . Modified radical mastectomy Right 03/11/2010  . Port-a-cath removal Left 12/01/2010  . Removal of tissue expander and placement of implant Right 08/23/2013    Procedure: REMOVAL RIGHT TISSUE EXPANDER AND PLACEMENT OF IMPLANT TO RIGHT BREAST ;  Surgeon: Theodoro Kos, DO;  Location: [facility];  Service: Plastics;  Laterality: Right;  . Breast reduction surgery Left 08/23/2013    Procedure: LEFT BREAST REDUCTION  ;  Surgeon: Theodoro Kos, DO;  Location: [facility];  Service: Plastics;  Laterality: Left;  . Liposuction Bilateral 08/23/2013    Procedure: LIPOSUCTION;  Surgeon: Theodoro Kos, DO;  Location: [facility];  Service: Plastics;  Laterality: Bilateral;    There were no vitals filed for this visit.  Visit Diagnosis:  Myofascial muscle pain  Shoulder joint stiffness, right  Muscle spasms of neck  Muscle weakness of right upper extremity  Stiffness of joint, shoulder region, right  Right-sided back pain, unspecified location  Lymphedema of upper extremity following lymphadenectomy  Abnormal posture      Subjective Assessment - 09/10/14 1106    Subjective My feet are swelling and I am in lymphedema wrap on Right.  I am discouraged with my body and my doctor doesnt know why I am swelling everywhere.  I go back to the MD on 09-20-14 and I am getting blodd work on the 09-12-14.     Pertinent History Right breast cancer with mastectomy 2011; reconstruction; left breast reduction; h/o right UE lymphedema and right upper quadrant pain, treated in this clinic several times before.  Recent scare with left breeast, but but ultrasound and mammogram were negative.   Patient Stated Goals decrease pain and use arm regularly (right hand dominant)   Pain Location Neck   Pain Orientation Right;Left   Pain Descriptors / Indicators Sore;Aching  stiffness.             [facility] PT Assessment - 09/10/14 1110    AROM   Right Shoulder Flexion 114 Degrees   Right Shoulder ABduction 95 Degrees  pain in Right side back with abd   Right Shoulder Internal Rotation --  unable with lymphedema wrap   Right Shoulder External Rotation --  with lymphedema wrapy   Cervical Flexion 27  after treatment for all cervical motions   Cervical Extension 47   Cervical - Right Side Bend 35   Cervical - Left Side Bend 40   Cervical - Right Rotation 40   Cervical - Left Rotation 50                    [facility] Adult PT Treatment/Exercise - 09/10/14 0001    Manual Therapy   Manual Lymphatic Drainage (MLD) In Supine: Short neck, Lt axilla and Rt inguinal anastomosis and Rt UE from dorsal hand to lateral shoulder.    Compression Bandaging Biotone lotion applied, then bandaging with stockinette, Elastomull on all fingers one -four , Artiflex x 2, and 1-6 cm., 2-10 cm., and 2-12 cm. bandages from right hand to shoulder.                  PT Short Term Goals - 09/05/14 1813    PT SHORT TERM GOAL #1   Title Patient will report a 25% reduction in pain with usual daily activities and home chores.   Status Achieved   PT SHORT TERM GOAL #2   Title Patient will be able to resume a basic home ex program with minimal increase in pain    Status Achieved   PT SHORT TERM GOAL #3   Title Right shoulder flexion improved to 92 degrees needed for reaching shoulder height shelves.   Status Achieved   PT SHORT TERM GOAL #4   Title Quick DASH score improved from 70.5 "severe difficulty" with ADLs level to 60 indicating improved function with less pain with grooming, household chores and sleep.     Baseline 60   Status Achieved         Short Term Clinic Goals - 09/03/14 1217    CC Short Term Goal  #1   Title short term goals= long term goals          PT Long Term Goals - 09/10/14 1140    PT LONG TERM GOAL #1   Title Independent with progressive HEP for further ROM, strengthening of Right UE.   Baseline ongoing goal, pt with increased body swelling and seeing MD   Time 14   Period Weeks   Status Revised   PT LONG TERM GOAL #2   Title Right shoulder flexion and abduction AROM improved to 105 degrees needed for reaching eye level shelves in the kitchen.     Time 14   Period Weeks   Status Revised   PT LONG TERM GOAL #3   Title Right UE strength grossly 4-/5 needed for basic cooking/cleaning for her 3 children.     Time 14   Period Weeks   Status Revised   PT LONG TERM GOAL #4    Title Quick DASH score improved to 50 indicating "moderate difficulty" with basic ADLS rather than "severe difficulty'"   Baseline newest score 60    Time 14   Period Weeks   Status Revised   PT LONG TERM GOAL #5   Title Patient will report overall  improvement in pain level 3/10 level or better  and improve  function at 50%.   Baseline Pain is sporadic, dry needling is helpful but pain is intermittentn   Time 14   Period Weeks   Status Revised   Additional Long Term Goals   Additional Long Term Goals Yes   PT LONG TERM GOAL #6   Title Pt will be able to manage pain at night in order to sleep 4 or more hours of restorative sleep. Pt will verbalize 3 pain management strategies for sleep/rest   Time 14   Period Weeks   Status New           Long Term Clinic Goals - 09/04/14 1223    CC Long Term Goal  #4   Title right arm circumference at 15 cm provximal to olecranon will be reduced by 3 sm   Time 4   Period Weeks   Status On-going            Plan - 2014-09-17 1212    Clinical Impression Statement 39 yo continuing PT for lymphedema and on ortho side for pain relief throught manual and trigger point dry needling.          G-Codes - 09-17-14 1105    Functional Assessment Tool Used quick DASH  taken from last visit   Functional Limitation Carrying, moving and handling objects   Carrying, Moving and Handling Objects Current Status [phone]) At least 60 percent but less than 80 percent impaired, limited or restricted  52   Carrying, Moving and Handling Objects Goal Status ([id number]) At least 40 percent but less than 60 percent impaired, limited or restricted      Problem List Patient Active Problem List   Diagnosis Date Noted  . Breast cancer, right breast 05/24/2014  . Myalgia and myositis 05/17/2014  . Neoplasm related pain 03/12/2014  . CN (constipation) 11/26/2013  . H/O reduction mammoplasty 10/30/2013  . Seizure 10/06/2013  . Status post bilateral breast implants  08/31/2013  . Acquired absence of breast and absent nipple 08/23/2013  . S/P breast reconstruction, right 08/23/2013  . AC joint arthropathy 07/02/2013  . Routine adult health maintenance 05/29/2013  . Hypokalemia 05/21/2013  . Chronic pain 04/19/2013  . Elevated blood sugar 03/09/2013  . Wheezing 02/22/2013  . Acute bronchitis 02/22/2013  . Lymphedema 05/24/2011  . RHINOSINUSITIS, CHRONIC 04/08/2010  . Obesity 08/04/2006  . DEPRESSIVE DISORDER, NOS 08/04/2006  . RHINITIS, ALLERGIC 08/04/2006    Otelia Limes, PTA 09/17/14, 12:22 PM  [place], [place], [address] Phone: [phone]   Fax:  [phone]

## 2014-09-10 NOTE — Patient Instructions (Signed)
Princess get a big beach towel and fold length wise and lie down with knees bent to decrease arch in back.  Lie for 5 minutes and let thoracic paraspnals stretch.    In chair can extend as well as shown in clinic  Voncille Lo, [place] 09/10/2014 11:52 AM Phone: [phone] Fax: [phone]

## 2014-09-10 NOTE — Therapy (Signed)
[place], [place], [address] Phone: [phone]   Fax:  [phone]  Physical Therapy Treatment  Patient Details  Name: Karen Hopkins MRN: [id number] Date of Birth: May 05, 1976 Referring Provider:  [name], MD  Encounter Date: 09/10/2014      PT End of Session - 09/10/14 1211    Visit Number 11   Number of Visits 28   Date for PT Re-Evaluation 10/22/14   PT Start Time 1100   PT Stop Time 1155   PT Time Calculation (min) 55 min   Activity Tolerance Patient tolerated treatment well;Patient limited by pain   Behavior During Therapy [facility] for tasks assessed/performed      Past Medical History  Diagnosis Date  . Seasonal allergies   . Lymphedema of arm     right; no BP or puncture to right arm  . Depression   . Anxiety   . Sinus headache   . Hypertension     under control with med., has been on med. x 2 yr.  . History of breast cancer 2011    right  . History of chemotherapy 2011  . History of radiation therapy 2011    Past Surgical History  Procedure Laterality Date  . Supra-umbilical hernia  3474  . Nasal septum surgery    . Latissimus flap to breast Right 03/12/2013    Procedure: RIGHT BREAST LATISSIMUS FLAP WITH EXPANDER PLACEMENT;  Surgeon: Theodoro Kos, DO;  Location: [place];  Service: Plastics;  Laterality: Right;  . Cesarean section  07/15/2001; 03/18/2004; 11/21/2008  . Tubal ligation  11/21/2008  . Portacath placement Left 09/12/2009  . Modified radical mastectomy Right 03/11/2010  . Port-a-cath removal Left 12/01/2010  . Removal of tissue expander and placement of implant Right 08/23/2013    Procedure: REMOVAL RIGHT TISSUE EXPANDER AND PLACEMENT OF IMPLANT TO RIGHT BREAST ;  Surgeon: Theodoro Kos, DO;  Location: [place];  Service: Plastics;  Laterality: Right;  . Breast reduction surgery Left 08/23/2013    Procedure: LEFT BREAST REDUCTION  ;  Surgeon: Theodoro Kos, DO;  Location: [place];  Service: Plastics;  Laterality: Left;  . Liposuction Bilateral 08/23/2013    Procedure: LIPOSUCTION;  Surgeon: Theodoro Kos, DO;  Location: [place];  Service: Plastics;  Laterality: Bilateral;    There were no vitals filed for this visit.  Visit Diagnosis:  Myofascial muscle pain  Muscle spasms of neck  Right-sided back pain, unspecified location  Abnormal posture      Subjective Assessment - 09/10/14 1106    Subjective My feet are swelling and I am in lymphedema wrap on Right.  I am discouraged with my body and my doctor doesnt know why I am swelling everywhere.  I go back to the MD on 09-20-14 and I am getting blodd work on the 09-12-14.     Pertinent History Right breast cancer with mastectomy 2011; reconstruction; left breast reduction; h/o right UE lymphedema and right upper quadrant pain, treated in this clinic several times before.  Recent scare with left breeast, but but ultrasound and mammogram were negative.   Patient Stated Goals decrease pain and use arm regularly (right hand dominant)   Pain Location Neck   Pain Orientation Right;Left   Pain Descriptors / Indicators Sore;Aching  stiffness.             [facility] PT Assessment - 09/10/14 1110    AROM   Right  Shoulder Flexion 114 Degrees   Right Shoulder ABduction 95 Degrees  pain in Right side back with abd   Right Shoulder Internal Rotation --  unable with lymphedema wrap   Right Shoulder External Rotation --  with lymphedema wrapy   Cervical Flexion 27  after treatment for all cervical motions   Cervical Extension 47   Cervical - Right Side Bend 35   Cervical - Left Side Bend 40   Cervical - Right Rotation 40   Cervical - Left Rotation 50   Palpation   Palpation Pt with tenderness over R rhomboids and scar tissues and over thoracic paraspinals , with marked tendernessover medial sub scapularis and rhomboids  Pt also with pain over thoracic PA  sp proc T-6 to T -11                    Trigger Point Dry Needling - 09/10/14 1235    Consent Given? Yes   Muscles Treated Upper Body Upper trapezius;Oblique capitus;Levator scapulae;Rhomboids;Subscapularis;Infraspinatus;Supraspinatus  Thoracic parasp Right T-5  to T 11 twitch response illicited   Upper Trapezius Response Twitch reponse elicited;Palpable increased muscle length   Oblique Capitus Response Twitch response elicited;Palpable increased muscle length   SubOccipitals Response Twitch response elicited;Palpable increased muscle length   Levator Scapulae Response Twitch response elicited;Palpable increased muscle length   Rhomboids Response Twitch response elicited;Palpable increased muscle length   Supraspinatus Response Twitch response elicited;Palpable increased muscle length   Infraspinatus Response Twitch response elicited;Palpable increased muscle length   Subscapularis Response Twitch response elicited;Palpable increased muscle length      Right sided only. Followed by myofascial release and joint mobility PA glides of cervical and and thoracic area to T- 11 Also extensive MFR to Right scar tissue and rhomboid areas.  As well as rhomboids, subscapularis and other areas with trigger point dryneedling   Pt also instructed in self thoracic extension using beach towel lengthwise and also using on chair to help extend.  Pt demo understanding to lie supine with towel for added stretch for 5 minutes  After discussion lymphadema therapist , heat will no longer be used to see if decreased swellling will occur           PT Short Term Goals - 09/05/14 1813    PT SHORT TERM GOAL #1   Title Patient will report a 25% reduction in pain with usual daily activities and home chores.   Status Achieved   PT SHORT TERM GOAL #2   Title Patient will be able to resume a basic home ex program with minimal increase in pain    Status Achieved   PT SHORT TERM GOAL #3   Title  Right shoulder flexion improved to 92 degrees needed for reaching shoulder height shelves.   Status Achieved   PT SHORT TERM GOAL #4   Title Quick DASH score improved from 70.5 "severe difficulty" with ADLs level to 60 indicating improved function with less pain with grooming, household chores and sleep.     Baseline 60   Status Achieved         Short Term Clinic Goals - 09/03/14 1217    CC Short Term Goal  #1   Title short term goals= long term goals          PT Long Term Goals - 09/10/14 1140    PT LONG TERM GOAL #1   Title Independent with progressive HEP for further ROM, strengthening of Right UE.   Baseline ongoing goal,  pt with increased body swelling and seeing MD   Time 14   Period Weeks   Status Revised   PT LONG TERM GOAL #2   Title Right shoulder flexion and abduction AROM improved to 105 degrees needed for reaching eye level shelves in the kitchen.     Time 14   Period Weeks   Status Revised   PT LONG TERM GOAL #3   Title Right UE strength grossly 4-/5 needed for basic cooking/cleaning for her 3 children.     Time 14   Period Weeks   Status Revised   PT LONG TERM GOAL #4   Title Quick DASH score improved to 50 indicating "moderate difficulty" with basic ADLS rather than "severe difficulty'"   Baseline newest score 60    Time 14   Period Weeks   Status Revised   PT LONG TERM GOAL #5   Title Patient will report overall improvement in pain level 3/10 level or better  and improve  function at 50%.   Baseline Pain is sporadic, dry needling is helpful but pain is intermittentn   Time 14   Period Weeks   Status Revised   Additional Long Term Goals   Additional Long Term Goals Yes   PT LONG TERM GOAL #6   Title Pt will be able to manage pain at night in order to sleep 4 or more hours of restorative sleep. Pt will verbalize 3 pain management strategies for sleep/rest   Time 14   Period Weeks   Status New           Long Term Clinic Goals - 09/04/14 1223     CC Long Term Goal  #4   Title right arm circumference at 15 cm provximal to olecranon will be reduced by 3 sm   Time 4   Period Weeks   Status On-going            Plan - 09-13-14 1212    Clinical Impression Statement 39 yo continuing PT for lymphedema and on ortho side for pain relief throught manual and trigger point dry needling for neck and thoracic pain.  Pt with increased AROM of cervical motion in all planes except rotation bilaterally cervical flex 27, ext 47,  R and L side bend 35/40, rotation remains 40/50.  Pt has had overall increased body swelling and is pursuing evaluation by MD.  Pt only gets relief  from dry needling and has had difficulty getting appointments consistently.   Pt would benefit from extending PT for 6 weeks to maximize pain management / and maximize function of Right arm and maximize cervical roation.     Pt will benefit from skilled therapeutic intervention in order to improve on the following deficits Impaired UE functional use;Increased fascial restricitons;Increased edema;Impaired flexibility;Postural dysfunction;Obesity;Decreased scar mobility;Decreased range of motion   Rehab Potential Good   Clinical Impairments Affecting Rehab Potential multiple medical and chronic pain issues   PT Frequency 2x / week   PT Duration 6 weeks   PT Treatment/Interventions ADLs/Self Care Home Management;Dry needling;Manual techniques;Scar mobilization;Therapeutic exercise;Therapeutic activities;Patient/family education   PT Next Visit Plan continue pain relief and thoracic extension   PT Home Exercise Plan gave thoracic extention and chair extension on handout   Consulted and Agree with Plan of Care Patient          G-Codes - 2014-09-13 1105    Functional Assessment Tool Used quick DASH  taken from last visit   Functional Limitation Carrying, moving and  handling objects   Carrying, Moving and Handling Objects Current Status [phone]) At least 60 percent but less than 80  percent impaired, limited or restricted  99   Carrying, Moving and Handling Objects Goal Status (L8921) At least 40 percent but less than 60 percent impaired, limited or restricted      Problem List Patient Active Problem List   Diagnosis Date Noted  . Breast cancer, right breast 05/24/2014  . Myalgia and myositis 05/17/2014  . Neoplasm related pain 03/12/2014  . CN (constipation) 11/26/2013  . H/O reduction mammoplasty 10/30/2013  . Seizure 10/06/2013  . Status post bilateral breast implants 08/31/2013  . Acquired absence of breast and absent nipple 08/23/2013  . S/P breast reconstruction, right 08/23/2013  . AC joint arthropathy 07/02/2013  . Routine adult health maintenance 05/29/2013  . Hypokalemia 05/21/2013  . Chronic pain 04/19/2013  . Elevated blood sugar 03/09/2013  . Wheezing 02/22/2013  . Acute bronchitis 02/22/2013  . Lymphedema 05/24/2011  . RHINOSINUSITIS, CHRONIC 04/08/2010  . Obesity 08/04/2006  . DEPRESSIVE DISORDER, NOS 08/04/2006  . RHINITIS, ALLERGIC 08/04/2006    Voncille Lo, PT 09/10/2014 12:59 PM Phone: [phone] Fax: [facility] [place], [place], [address] Phone: [phone]   Fax:  [phone]

## 2014-09-12 ENCOUNTER — Ambulatory Visit: Payer: Medicare Other | Admitting: Physical Medicine & Rehabilitation

## 2014-09-12 ENCOUNTER — Encounter: Payer: Medicare Other | Attending: Physical Medicine & Rehabilitation | Admitting: Registered Nurse

## 2014-09-12 ENCOUNTER — Other Ambulatory Visit: Payer: Self-pay | Admitting: Physical Medicine & Rehabilitation

## 2014-09-12 ENCOUNTER — Encounter: Payer: Self-pay | Admitting: Registered Nurse

## 2014-09-12 ENCOUNTER — Other Ambulatory Visit (HOSPITAL_BASED_OUTPATIENT_CLINIC_OR_DEPARTMENT_OTHER): Payer: Medicare Other

## 2014-09-12 VITALS — BP 128/68 | HR 98 | Resp 16

## 2014-09-12 DIAGNOSIS — G894 Chronic pain syndrome: Secondary | ICD-10-CM

## 2014-09-12 DIAGNOSIS — R739 Hyperglycemia, unspecified: Secondary | ICD-10-CM

## 2014-09-12 DIAGNOSIS — Z5181 Encounter for therapeutic drug level monitoring: Secondary | ICD-10-CM

## 2014-09-12 DIAGNOSIS — IMO0001 Reserved for inherently not codable concepts without codable children: Secondary | ICD-10-CM

## 2014-09-12 DIAGNOSIS — M791 Myalgia: Secondary | ICD-10-CM

## 2014-09-12 DIAGNOSIS — M609 Myositis, unspecified: Secondary | ICD-10-CM

## 2014-09-12 DIAGNOSIS — Z79899 Other long term (current) drug therapy: Secondary | ICD-10-CM

## 2014-09-12 DIAGNOSIS — M545 Low back pain, unspecified: Secondary | ICD-10-CM

## 2014-09-12 DIAGNOSIS — I890 Lymphedema, not elsewhere classified: Secondary | ICD-10-CM | POA: Insufficient documentation

## 2014-09-12 DIAGNOSIS — E669 Obesity, unspecified: Secondary | ICD-10-CM

## 2014-09-12 DIAGNOSIS — C50911 Malignant neoplasm of unspecified site of right female breast: Secondary | ICD-10-CM

## 2014-09-12 DIAGNOSIS — M546 Pain in thoracic spine: Secondary | ICD-10-CM | POA: Insufficient documentation

## 2014-09-12 LAB — CBC WITH DIFFERENTIAL/PLATELET
BASO%: 0.7 % (ref 0.0–2.0)
BASOS ABS: 0.1 10*3/uL (ref 0.0–0.1)
EOS%: 5.9 % (ref 0.0–7.0)
Eosinophils Absolute: 0.6 10*3/uL — ABNORMAL HIGH (ref 0.0–0.5)
HCT: 38.6 % (ref 34.8–46.6)
HGB: 12.5 g/dL (ref 11.6–15.9)
LYMPH%: 24.5 % (ref 14.0–49.7)
MCH: 28 pg (ref 25.1–34.0)
MCHC: 32.5 g/dL (ref 31.5–36.0)
MCV: 86.4 fL (ref 79.5–101.0)
MONO#: 0.6 10*3/uL (ref 0.1–0.9)
MONO%: 5.9 % (ref 0.0–14.0)
NEUT#: 6.8 10*3/uL — ABNORMAL HIGH (ref 1.5–6.5)
NEUT%: 63 % (ref 38.4–76.8)
PLATELETS: 312 10*3/uL (ref 145–400)
RBC: 4.46 10*6/uL (ref 3.70–5.45)
RDW: 15.3 % — AB (ref 11.2–14.5)
WBC: 10.7 10*3/uL — AB (ref 3.9–10.3)
lymph#: 2.6 10*3/uL (ref 0.9–3.3)

## 2014-09-12 LAB — COMPREHENSIVE METABOLIC PANEL (CC13)
ALBUMIN: 3.5 g/dL (ref 3.5–5.0)
ALK PHOS: 76 U/L (ref 40–150)
ALT: 21 U/L (ref 0–55)
AST: 18 U/L (ref 5–34)
Anion Gap: 10 mEq/L (ref 3–11)
BUN: 15.3 mg/dL (ref 7.0–26.0)
CO2: 23 mEq/L (ref 22–29)
Calcium: 9.3 mg/dL (ref 8.4–10.4)
Chloride: 106 mEq/L (ref 98–109)
Creatinine: 0.8 mg/dL (ref 0.6–1.1)
GLUCOSE: 104 mg/dL (ref 70–140)
POTASSIUM: 3.7 meq/L (ref 3.5–5.1)
Sodium: 139 mEq/L (ref 136–145)
Total Bilirubin: 0.2 mg/dL (ref 0.20–1.20)
Total Protein: 7.2 g/dL (ref 6.4–8.3)

## 2014-09-12 MED ORDER — OXYCODONE HCL 5 MG PO TABS: 5.0000 mg | ORAL_TABLET | Freq: Four times a day (QID) | ORAL | Status: DC | PRN

## 2014-09-12 NOTE — Progress Notes (Signed)
Subjective:    Patient ID: Karen Hopkins, female    DOB: [birth year], 39 y.o.   MRN: [id number]  HPI: [name] is a 39 year old female who returns for follow up for chronic pain and medication refill. She says her pain is located in her right shoulder and lower extremities.  She rates her pain 7. Her current exercise regime is walking attending physical therapy twice a week fordry needling she's receiving good results.  Her PHQ-9 score 21, she's receiving counseling from her Doristine Bosworth. She denies suicidal ideation or plan. Admits to being depressed she states it's relating to all she's been through. She has joined [employer]  also states she's not always depressed she is trying to become more active and eating a healthy Diet. She's noticed lower extremities edema her oncologist referred her to lymphedema clinic. Also encouraged to F/U with her PCP she verbalizes understanding.  Pain Inventory Average Pain 7 Pain Right Now 7 My pain is sharp, burning, dull, stabbing, tingling and aching  In the last 24 hours, has pain interfered with the following? General activity 6 Relation with others 6 Enjoyment of life 5 What TIME of day is your pain at its worst? morning night Sleep (in general) Poor  Pain is worse with: walking, bending, sitting, inactivity and standing Pain improves with: therapy/exercise and medication Relief from Meds: no answer  Mobility walk without assistance how many minutes can you walk? 10 ability to climb steps?  yes do you drive?  yes  Function I need assistance with the following:  meal prep and household duties  Neuro/Psych numbness tingling spasms dizziness confusion depression anxiety  Prior Studies Any changes since last visit?  no  Physicians involved in your care Any changes since last visit?  no   Family History  Problem Relation Age of Onset  . Hypertension Mother   . Diabetes type II Father   . Prostate cancer Father     History   Social History  . Marital Status: Single    Spouse Name: N/A  . Number of Children: N/A  . Years of Education: N/A   Social History Main Topics  . Smoking status: Former Smoker -- 0.25 packs/day for 5 years    Quit date: 04/26/2010  . Smokeless tobacco: Never Used  . Alcohol Use: No  . Drug Use: No  . Sexual Activity: Not Currently    Birth Control/ Protection: Surgical   Other Topics Concern  . None   Social History Narrative   Past Surgical History  Procedure Laterality Date  . Supra-umbilical hernia  0737  . Nasal septum surgery    . Latissimus flap to breast Right 03/12/2013    Procedure: RIGHT BREAST LATISSIMUS FLAP WITH EXPANDER PLACEMENT;  Surgeon: Theodoro Kos, DO;  Location: [place];  Service: Plastics;  Laterality: Right;  . Cesarean section  07/15/2001; 03/18/2004; 11/21/2008  . Tubal ligation  11/21/2008  . Portacath placement Left 09/12/2009  . Modified radical mastectomy Right 03/11/2010  . Port-a-cath removal Left 12/01/2010  . Removal of tissue expander and placement of implant Right 08/23/2013    Procedure: REMOVAL RIGHT TISSUE EXPANDER AND PLACEMENT OF IMPLANT TO RIGHT BREAST ;  Surgeon: Theodoro Kos, DO;  Location: [place];  Service: Plastics;  Laterality: Right;  . Breast reduction surgery Left 08/23/2013    Procedure: LEFT BREAST REDUCTION  ;  Surgeon: Theodoro Kos, DO;  Location: [place];  Service: Plastics;  Laterality: Left;  .  Liposuction Bilateral 08/23/2013    Procedure: LIPOSUCTION;  Surgeon: Theodoro Kos, DO;  Location: [place];  Service: Plastics;  Laterality: Bilateral;   Past Medical History  Diagnosis Date  . Seasonal allergies   . Lymphedema of arm     right; no BP or puncture to right arm  . Depression   . Anxiety   . Sinus headache   . Hypertension     under control with med., has been on med. x 2 yr.  . History of breast cancer 2011    right  . History of chemotherapy  2011  . History of radiation therapy 2011   BP 128/68 mmHg  Pulse 98  Resp 16  SpO2 97%  LMP 07/22/2014 (Approximate)  Opioid Risk Score:   Fall Risk Score: Low Fall Risk (0-5 points) (previously educated and given handout)`1  Depression screen PHQ 2/9  Depression screen PHQ 2/9 09/12/2014  Decreased Interest 2  Down, Depressed, Hopeless 2  PHQ - 2 Score 4  Altered sleeping 3  Tired, decreased energy 3  Change in appetite 3  Feeling bad or failure about yourself  3  Trouble concentrating 3  Moving slowly or fidgety/restless 2  Suicidal thoughts 0  PHQ-9 Score 21    Review of Systems  Constitutional: Positive for unexpected weight change.  Musculoskeletal:       Spasms  Neurological: Positive for dizziness and numbness.       Tingling  Psychiatric/Behavioral: Positive for confusion and dysphoric mood. The patient is nervous/anxious.   All other systems reviewed and are negative.      Objective:   Physical Exam  Constitutional: She is oriented to [place], place, and time. She appears well-developed and well-nourished.  HENT:  Head: Normocephalic and atraumatic.  Neck: Normal range of motion. Neck supple.  Cardiovascular: Normal rate and regular rhythm.   Pulmonary/Chest: Effort normal and breath sounds normal.  Musculoskeletal: She exhibits edema.  Normal Muscle Bulk and Muscle Testing Reveals: Upper Extremities: Decreased ROM 45 Degrees and Muscle strength 5/5 Thoracic Tenderness/ S/P Dry Needling Lower Extremities: Full ROM and Muscle strength 5/5 Lower Extremities Pitting Edema Arises from chair with ease Narrow Based gait   Neurological: She is alert and oriented to [place], place, and time.  Skin: Skin is warm and dry.  Psychiatric: She has a normal mood and affect.  Nursing note and vitals reviewed.         Assessment & Plan:  1.Myofascial pain syndrome chronic postoperative, as well as post radiation: S/P Breast Reconstruction Surgery x 2.   Refilled: Oxycodone 5 mg one tablet every 6 hours as needed for severe pain.#120 Continue with Exercise Regime and Physical Therapy for Dry Needling. 2. Obesity: Continue Healthy Diet and Exercise Routine.    20 minutes of face to face patient care time was spent during this visit. All questions were encouraged and answered.

## 2014-09-13 ENCOUNTER — Ambulatory Visit: Payer: Medicare Other | Admitting: Physical Therapy

## 2014-09-13 ENCOUNTER — Ambulatory Visit (INDEPENDENT_AMBULATORY_CARE_PROVIDER_SITE_OTHER): Payer: Medicare Other | Admitting: Family Medicine

## 2014-09-13 ENCOUNTER — Telehealth: Payer: Self-pay | Admitting: *Deleted

## 2014-09-13 ENCOUNTER — Encounter: Payer: Self-pay | Admitting: Family Medicine

## 2014-09-13 VITALS — BP 129/70 | HR 91 | Temp 98.1°F | Ht 64.0 in | Wt 285.3 lb

## 2014-09-13 DIAGNOSIS — R635 Abnormal weight gain: Secondary | ICD-10-CM

## 2014-09-13 DIAGNOSIS — M62838 Other muscle spasm: Secondary | ICD-10-CM

## 2014-09-13 DIAGNOSIS — I890 Lymphedema, not elsewhere classified: Principal | ICD-10-CM

## 2014-09-13 DIAGNOSIS — R7309 Other abnormal glucose: Secondary | ICD-10-CM

## 2014-09-13 DIAGNOSIS — R600 Localized edema: Secondary | ICD-10-CM

## 2014-09-13 DIAGNOSIS — R7303 Prediabetes: Secondary | ICD-10-CM

## 2014-09-13 DIAGNOSIS — M6281 Muscle weakness (generalized): Secondary | ICD-10-CM

## 2014-09-13 DIAGNOSIS — E8989 Other postprocedural endocrine and metabolic complications and disorders: Secondary | ICD-10-CM

## 2014-09-13 DIAGNOSIS — M7918 Myalgia, other site: Secondary | ICD-10-CM

## 2014-09-13 DIAGNOSIS — M25611 Stiffness of right shoulder, not elsewhere classified: Secondary | ICD-10-CM

## 2014-09-13 LAB — POCT GLYCOSYLATED HEMOGLOBIN (HGB A1C): Hemoglobin A1C: 6.8

## 2014-09-13 LAB — T4, FREE: Free T4: 0.95 ng/dL (ref 0.80–1.80)

## 2014-09-13 LAB — TSH: TSH: 3.201 u[IU]/mL (ref 0.350–4.500)

## 2014-09-13 MED ORDER — FUROSEMIDE 20 MG PO TABS: 20.0000 mg | ORAL_TABLET | Freq: Every day | ORAL | Status: DC

## 2014-09-13 NOTE — Telephone Encounter (Signed)
FYI for Zella Ball: Cloyde Reams from [facility] called to notify us that they filled Karen Hopkins's rx for her oxycodone 5 mg but they did not have the full amount (#120) and had to dispense 73 and cannot get the remainder in the 72 hour time so she will nedd to pick up an rx for the remainder at some point.

## 2014-09-13 NOTE — Therapy (Signed)
[place], [place], [address] Phone: [phone]   Fax:  [phone]  Physical Therapy Treatment  Patient Details  Name: Karen Hopkins MRN: [id number] Date of Birth: 08-Mar-1976 Referring Provider:  [name], MD  Encounter Date: 09/13/2014      PT End of Session - 09/13/14 1205    Visit Number 12   Number of Visits 28   Date for PT Re-Evaluation 10/22/14   PT Start Time 1019   PT Stop Time 1112   PT Time Calculation (min) 53 min   Activity Tolerance Patient tolerated treatment well      Past Medical History  Diagnosis Date  . Seasonal allergies   . Lymphedema of arm     right; no BP or puncture to right arm  . Depression   . Anxiety   . Sinus headache   . Hypertension     under control with med., has been on med. x 2 yr.  . History of breast cancer 2011    right  . History of chemotherapy 2011  . History of radiation therapy 2011    Past Surgical History  Procedure Laterality Date  . Supra-umbilical hernia  6644  . Nasal septum surgery    . Latissimus flap to breast Right 03/12/2013    Procedure: RIGHT BREAST LATISSIMUS FLAP WITH EXPANDER PLACEMENT;  Surgeon: Theodoro Kos, DO;  Location: [facility];  Service: Plastics;  Laterality: Right;  . Cesarean section  07/15/2001; 03/18/2004; 11/21/2008  . Tubal ligation  11/21/2008  . Portacath placement Left 09/12/2009  . Modified radical mastectomy Right 03/11/2010  . Port-a-cath removal Left 12/01/2010  . Removal of tissue expander and placement of implant Right 08/23/2013    Procedure: REMOVAL RIGHT TISSUE EXPANDER AND PLACEMENT OF IMPLANT TO RIGHT BREAST ;  Surgeon: Theodoro Kos, DO;  Location: [facility];  Service: Plastics;  Laterality: Right;  . Breast reduction surgery Left 08/23/2013    Procedure: LEFT BREAST REDUCTION  ;  Surgeon: Theodoro Kos, DO;  Location: [facility];  Service: Plastics;  Laterality: Left;   . Liposuction Bilateral 08/23/2013    Procedure: LIPOSUCTION;  Surgeon: Theodoro Kos, DO;  Location: [facility];  Service: Plastics;  Laterality: Bilateral;    There were no vitals filed for this visit.  Visit Diagnosis:  Myofascial muscle pain  Shoulder joint stiffness, right  Muscle spasms of neck  Muscle weakness of right upper extremity  Stiffness of joint, shoulder region, right      Subjective Assessment - 09/13/14 1158    Subjective Complains of continued LE swelling and will see the doctor this afternoon regarding this problem.  Reports dry needling and deep tissue mobilization helps.  States she is feeling better today.  Going for compression wrapping following this appt.    Currently in Pain? Yes   Pain Score 5    Pain Location Shoulder   Pain Orientation Right   Pain Descriptors / Indicators Tightness   Pain Type Chronic pain   Pain Onset More than a month ago   Pain Frequency Constant   Aggravating Factors  reaching up   Pain Relieving Factors deep tissue massage                        [facility] Adult PT Treatment/Exercise - 09/13/14 1202    Shoulder Exercises: Supine   Horizontal ABduction Right;20 reps;Weights   Horizontal ABduction Weight (lbs)  1# 3:00/9:00 10x   Flexion Right;10 reps;Weights  supine 0 degrees and incline 30 degrees   Shoulder Flexion Weight (lbs) 1   Other Supine Exercises supine cane flexion and HABD 10x each   Manual Therapy   Myofascial Release Right upper trap and levator and rhomboid   Scapular Mobilization scapular med/lat/sup/inf glides and distraction grade 3/4          Trigger Point Dry Needling - 09/13/14 1204    Consent Given? Yes   Muscles Treated Upper Body Upper trapezius;Oblique capitus;Levator scapulae;Rhomboids;Infraspinatus;Subscapularis   Upper Trapezius Response Twitch reponse elicited;Palpable increased muscle length   Oblique Capitus Response Twitch response elicited   Levator  Scapulae Response Twitch response elicited;Palpable increased muscle length   Rhomboids Response Palpable increased muscle length   Infraspinatus Response Palpable increased muscle length   Subscapularis Response Palpable increased muscle length    Right side only.            PT Short Term Goals - 09/13/14 1210    PT SHORT TERM GOAL #1   Title Patient will report a 25% reduction in pain with usual daily activities and home chores.   Status Achieved   PT SHORT TERM GOAL #2   Title Patient will be able to resume a basic home ex program with minimal increase in pain    Status Achieved   PT SHORT TERM GOAL #3   Title Right shoulder flexion improved to 92 degrees needed for reaching shoulder height shelves.   Status Achieved   PT SHORT TERM GOAL #4   Title Quick DASH score improved from 70.5 "severe difficulty" with ADLs level to 60 indicating improved function with less pain with grooming, household chores and sleep.     Status Achieved         Short Term Clinic Goals - 09/03/14 1217    CC Short Term Goal  #1   Title short term goals= long term goals          PT Long Term Goals - 09/13/14 1210    PT LONG TERM GOAL #1   Title Independent with progressive HEP for further ROM, strengthening of Right UE.   Time 14   Period Weeks   Status On-going   PT LONG TERM GOAL #2   Title Right shoulder flexion and abduction AROM improved to 105 degrees needed for reaching eye level shelves in the kitchen.     Time 14   Period Weeks   Status On-going   PT LONG TERM GOAL #3   Title Right UE strength grossly 4-/5 needed for basic cooking/cleaning for her 3 children.     Time 14   Period Weeks   Status On-going   PT LONG TERM GOAL #4   Title Quick DASH score improved to 50 indicating "moderate difficulty" with basic ADLS rather than "severe difficulty'"   Time 14   Period Weeks   Status On-going   PT LONG TERM GOAL #5   Title Patient will report overall improvement in pain  level 3/10 level or better  and improve  function at 50%.   Time 14   Period Weeks   Status On-going   PT LONG TERM GOAL #6   Title Pt will be able to manage pain at night in order to sleep 4 or more hours of restorative sleep. Pt will verbalize 3 pain management strategies for sleep/rest   Time 14   Period Weeks   Status On-going  [facility] Goals - 09/04/14 1223    CC Long Term Goal  #4   Title right arm circumference at 15 cm provximal to olecranon will be reduced by 3 sm   Time 4   Period Weeks   Status On-going            Plan - 09/13/14 1206    Clinical Impression Statement Patient reports that deep tissue mobilization of rhomboid/suscapular muscles are especially helpful.  Therapist monitoring response throughout.   Poor control for active shoulder flexion (bent arm) in supine contributing to overuse of upper trapezius.  No additonal progress towards goals.     PT Next Visit Plan Manual therapy; dry needling; [place] and scapular strengthening as able to decrease upper trap overuse        Problem List Patient Active Problem List   Diagnosis Date Noted  . Breast cancer, right breast 05/24/2014  . Myalgia and myositis 05/17/2014  . Neoplasm related pain 03/12/2014  . CN (constipation) 11/26/2013  . H/O reduction mammoplasty 10/30/2013  . Seizure 10/06/2013  . Status post bilateral breast implants 08/31/2013  . Acquired absence of breast and absent nipple 08/23/2013  . S/P breast reconstruction, right 08/23/2013  . AC joint arthropathy 07/02/2013  . Routine adult health maintenance 05/29/2013  . Hypokalemia 05/21/2013  . Chronic pain 04/19/2013  . Elevated blood sugar 03/09/2013  . Wheezing 02/22/2013  . Acute bronchitis 02/22/2013  . Lymphedema 05/24/2011  . RHINOSINUSITIS, CHRONIC 04/08/2010  . Obesity 08/04/2006  . DEPRESSIVE DISORDER, NOS 08/04/2006  . RHINITIS, ALLERGIC 08/04/2006    Alvera Singh 09/13/2014, 12:12 PM  [facility] [address] [place], [place], [address] Phone: [phone]   Fax:  [phone]  Ruben Im, PT 09/13/2014 12:13 PM Phone: [phone] Fax: [phone]

## 2014-09-13 NOTE — Addendum Note (Signed)
Addended by: Martin[place], Manfred Laspina on: 09/13/2014 02:33 PM   Modules accepted: Orders

## 2014-09-13 NOTE — Therapy (Signed)
[place], [place], [address] Phone: [phone]   Fax:  [phone]  Physical Therapy Treatment  Patient Details  Name: Karen Hopkins MRN: [id number] Date of Birth: [birth year] Referring Provider:  [name], MD  Encounter Date: 09/13/2014      PT End of Session - 09/13/14 1220    Visit Number 13   Number of Visits 28   Date for PT Re-Evaluation 10/22/14   PT Start Time 1115   PT Stop Time 1155   PT Time Calculation (min) 40 min      Past Medical History  Diagnosis Date  . Seasonal allergies   . Lymphedema of arm     right; no BP or puncture to right arm  . Depression   . Anxiety   . Sinus headache   . Hypertension     under control with med., has been on med. x 2 yr.  . History of breast cancer 2011    right  . History of chemotherapy 2011  . History of radiation therapy 2011    Past Surgical History  Procedure Laterality Date  . Supra-umbilical hernia  0109  . Nasal septum surgery    . Latissimus flap to breast Right 03/12/2013    Procedure: RIGHT BREAST LATISSIMUS FLAP WITH EXPANDER PLACEMENT;  Surgeon: Theodoro Kos, DO;  Location: [facility];  Service: Plastics;  Laterality: Right;  . Cesarean section  07/15/2001; 03/18/2004; 11/21/2008  . Tubal ligation  11/21/2008  . Portacath placement Left 09/12/2009  . Modified radical mastectomy Right 03/11/2010  . Port-a-cath removal Left 12/01/2010  . Removal of tissue expander and placement of implant Right 08/23/2013    Procedure: REMOVAL RIGHT TISSUE EXPANDER AND PLACEMENT OF IMPLANT TO RIGHT BREAST ;  Surgeon: Theodoro Kos, DO;  Location: [facility];  Service: Plastics;  Laterality: Right;  . Breast reduction surgery Left 08/23/2013    Procedure: LEFT BREAST REDUCTION  ;  Surgeon: Theodoro Kos, DO;  Location: [place];  Service: Plastics;  Laterality: Left;  . Liposuction Bilateral 08/23/2013    Procedure:  LIPOSUCTION;  Surgeon: Theodoro Kos, DO;  Location: [facility];  Service: Plastics;  Laterality: Bilateral;    There were no vitals filed for this visit.  Visit Diagnosis:  Lymphedema of upper extremity following lymphadenectomy      Subjective Assessment - 09/13/14 1217    Subjective Pt is going to family doctor this afternoon to ask about pitting edema in lower legs, pain in legs while walking, possibliy of thyrod problems and a consult to the nutrition center   Pertinent History Right breast cancer with mastectomy 2011; reconstruction; left breast reduction; h/o right UE lymphedema and right upper quadrant pain, treated in this clinic several times before.  Recent scare with left breeast, but but ultrasound and mammogram were negative.   Currently in Pain? Yes   Pain Score --  did not rate today   Pain Relieving Factors feels better after dry needling and deep tissue work                        [facility] Adult PT Treatment/Exercise - 09/13/14 1220    Manual Therapy   Manual Lymphatic Drainage (MLD) In Supine: Short neck, Lt axilla and Rt inguinal anastomosis and Rt UE from dorsal hand to lateral shoulder.    Compression Bandaging Biotone lotion applied, then bandaging with stockinette, Elastomull on all fingers  one -four , Artiflex x 2, and 1-6 cm., 2-10 cm., and 2-12 cm. bandages from right hand to shoulder.          Trigger Point Dry Needling - 09/13/14 1204    Consent Given? Yes   Muscles Treated Upper Body Upper trapezius;Oblique capitus;Levator scapulae;Rhomboids;Infraspinatus;Subscapularis   Upper Trapezius Response Twitch reponse elicited;Palpable increased muscle length   Oblique Capitus Response Twitch response elicited   Levator Scapulae Response Twitch response elicited;Palpable increased muscle length   Rhomboids Response Palpable increased muscle length   Infraspinatus Response Palpable increased muscle length   Subscapularis Response  Palpable increased muscle length                PT Short Term Goals - 09/13/14 1210    PT SHORT TERM GOAL #1   Title Patient will report a 25% reduction in pain with usual daily activities and home chores.   Status Achieved   PT SHORT TERM GOAL #2   Title Patient will be able to resume a basic home ex program with minimal increase in pain    Status Achieved   PT SHORT TERM GOAL #3   Title Right shoulder flexion improved to 92 degrees needed for reaching shoulder height shelves.   Status Achieved   PT SHORT TERM GOAL #4   Title Quick DASH score improved from 70.5 "severe difficulty" with ADLs level to 60 indicating improved function with less pain with grooming, household chores and sleep.     Status Achieved         Short Term Clinic Goals - 09/03/14 1217    CC Short Term Goal  #1   Title short term goals= long term goals          PT Long Term Goals - 09/13/14 1210    PT LONG TERM GOAL #1   Title Independent with progressive HEP for further ROM, strengthening of Right UE.   Time 14   Period Weeks   Status On-going   PT LONG TERM GOAL #2   Title Right shoulder flexion and abduction AROM improved to 105 degrees needed for reaching eye level shelves in the kitchen.     Time 14   Period Weeks   Status On-going   PT LONG TERM GOAL #3   Title Right UE strength grossly 4-/5 needed for basic cooking/cleaning for her 3 children.     Time 14   Period Weeks   Status On-going   PT LONG TERM GOAL #4   Title Quick DASH score improved to 50 indicating "moderate difficulty" with basic ADLS rather than "severe difficulty'"   Time 14   Period Weeks   Status On-going   PT LONG TERM GOAL #5   Title Patient will report overall improvement in pain level 3/10 level or better  and improve  function at 50%.   Time 14   Period Weeks   Status On-going   PT LONG TERM GOAL #6   Title Pt will be able to manage pain at night in order to sleep 4 or more hours of restorative sleep. Pt  will verbalize 3 pain management strategies for sleep/rest   Time 14   Period Weeks   Status On-going           Long Term Clinic Goals - 09/13/14 1223    CC Long Term Goal  #4   Title right arm circumference at 15 cm provximal to olecranon will be reduced by 3 sm   Status On-going  Plan - 09/13/14 1221    Clinical Impression Statement continued reports of pitting edema below knees  and swellling in abdomen.  She has not used Flexitouch becauase her arm has been wrapped. Hopeful for more information from MD visit today    PT Next Visit Plan Remeasure arm and assess goal for circumference. contiue with Complete Decongestive Therapy as needed        Problem List Patient Active Problem List   Diagnosis Date Noted  . Breast cancer, right breast 05/24/2014  . Myalgia and myositis 05/17/2014  . Neoplasm related pain 03/12/2014  . CN (constipation) 11/26/2013  . H/O reduction mammoplasty 10/30/2013  . Seizure 10/06/2013  . Status post bilateral breast implants 08/31/2013  . Acquired absence of breast and absent nipple 08/23/2013  . S/P breast reconstruction, right 08/23/2013  . AC joint arthropathy 07/02/2013  . Routine adult health maintenance 05/29/2013  . Hypokalemia 05/21/2013  . Chronic pain 04/19/2013  . Elevated blood sugar 03/09/2013  . Wheezing 02/22/2013  . Acute bronchitis 02/22/2013  . Lymphedema 05/24/2011  . RHINOSINUSITIS, CHRONIC 04/08/2010  . Obesity 08/04/2006  . DEPRESSIVE DISORDER, NOS 08/04/2006  . RHINITIS, ALLERGIC 08/04/2006   Donato Heinz. Owens Shark, PT  09/13/2014, 12:24 PM  [place], [place], [address] Phone: [phone]   Fax:  [phone]

## 2014-09-13 NOTE — Patient Instructions (Signed)
Compression Stockings Compression stockings are elastic stockings that "compress" your legs. This helps to increase blood flow, decrease swelling, and reduces the chance of getting blood clots in your lower legs. Compression stockings are used:  After surgery.  If you have a history of poor circulation.  If you are prone to blood clots.  If you have varicose veins.  If you sit or are bedridden for long periods of time. WEARING COMPRESSION STOCKINGS  Your compression stockings should be worn as instructed by your caregiver.  Wearing the correct stocking size is important. Your caregiver can help measure and fit you to the correct size.  When wearing your stockings, do not allow the stockings to bunch up. This is especially important around your toes or behind your knees. Keep the stockings as smooth as possible.  Do not roll the stockings downward and leave them rolled down. This can form a restrictive band around your legs and can decrease blood flow.  The stockings should be removed once a day for 1 hour or as instructed by your caregiver. When the stockings are taken off, inspect your legs and feet. Look for:  Open sores.  Red spots.  Puffy areas (swelling).  Anything that does not seem normal. IMPORTANT INFORMATION ABOUT COMPRESSION STOCKINGS  The compression stockings should be clean, dry, and in good condition before you put them on.  Do not put lotion on your legs or feet. This makes it harder to put the stockings on.  Change your stockings immediately if they become wet or soiled.  Do not wear stockings that are ripped or torn.  You may hand-wash or put your stockings in the washing machine. Use cold or warm water with mild detergent. Do not bleach your stockings. They may be air-dried or dried in the dryer on low heat.  If you have pain or have a feeling of "pins and needles" in your feet or legs, you may be wearing stockings that are too tight. Call your caregiver  right away. SEEK IMMEDIATE MEDICAL CARE IF:   You have numbness or tingling in your lower legs that does not get better quickly after the stockings are removed.  Your toes or feet become cold and blue.  You develop open sores or have red spots on your legs that do not go away. MAKE SURE YOU:   Understand these instructions.  Will watch your condition.  Will get help right away if you are not doing well or get worse. Document Released: 03/21/2009 Document Revised: 08/16/2011 Document Reviewed: 03/21/2009 Exit[employer] Patient Information 2015 [employer]. This information is not intended to replace advice given to you by your health care provider. Make sure you discuss any questions you have with your health care provider.  

## 2014-09-13 NOTE — Progress Notes (Signed)
   Subjective:    Patient ID: Karen Hopkins, female    DOB: [birth year], 39 y.o.   MRN: [id number]  HPI  LE edema: - x3 wks, reports changing eating habits and seems swelling is worse.  Eats baked foods, no fried foods, only drinks water - no associated SOB, cannot lay flat unable to inquire about orthopnea/PND (2/2 chronic pain)  Past Medical History  Diagnosis Date  . Seasonal allergies   . Lymphedema of arm     right; no BP or puncture to right arm  . Depression   . Anxiety   . Sinus headache   . Hypertension     under control with med., has been on med. x 2 yr.  . History of breast cancer 2011    right  . History of chemotherapy 2011  . History of radiation therapy 2011     Review of Systems  Constitutional: Positive for chills, diaphoresis and unexpected weight change (7lb weight gain despite efforts to lose weight). Negative for fever.  Respiratory: Positive for shortness of breath (baseline). Negative for cough.   Cardiovascular: Positive for leg swelling. Negative for chest pain.  Gastrointestinal: Positive for constipation. Negative for abdominal pain, diarrhea and anal bleeding.  Endocrine: Negative for cold intolerance and heat intolerance.  Genitourinary: Negative for dysuria, urgency, decreased urine volume and difficulty urinating.  Neurological: Positive for headaches.       Objective:   Physical Exam  Constitutional: She appears well-developed and well-nourished. No distress.  HENT:  Mouth/Throat: Mucous membranes are moist. Pharynx is normal.  Eyes: Conjunctivae and EOM are normal.  Neck: No adenopathy.  Cardiovascular: Normal rate, regular rhythm and S2 normal.   Pulmonary/Chest: Effort normal and breath sounds normal.  Abdominal: She exhibits no distension. There is no tenderness.  Musculoskeletal: Normal range of motion. She exhibits edema (1+ bilateral, worse with pedal, up to mid-shin).  Neurological: She is alert. No cranial nerve deficit.  Coordination normal.  Skin: Skin is warm. No rash noted. She is not diaphoretic. No pallor.   BP 129/70 mmHg  Pulse 91  Temp(Src) 98.1 F (36.7 C) (Oral)  Ht 5\' 4"  (1.626 m)  Wt 285 lb 4.8 oz (129.411 kg)  BMI 48.95 kg/m2  LMP        Assessment & Plan:  39 yo female with chronic right arm lymphedema 2/2 breast cancer sequela here with lower extremity edema - CBC, CMP, TSH, free T4 - lasix once daily x 1 week then prn once daily for 5lb weight gain - no signs of heart failure, no indication for ECHO at this time.  No signs of DVT. - referral to nutritionist for weight gain - compression stalkings prn for symptomatic tx  Merla Riches, MD 2:12 PM

## 2014-09-14 LAB — PMP ALCOHOL METABOLITE (ETG): ETGU: NEGATIVE ng/mL

## 2014-09-17 ENCOUNTER — Ambulatory Visit: Payer: Medicare Other

## 2014-09-17 ENCOUNTER — Ambulatory Visit: Payer: Medicare Other | Admitting: Physical Therapy

## 2014-09-17 DIAGNOSIS — I890 Lymphedema, not elsewhere classified: Principal | ICD-10-CM

## 2014-09-17 DIAGNOSIS — M5489 Other dorsalgia: Secondary | ICD-10-CM

## 2014-09-17 DIAGNOSIS — M6281 Muscle weakness (generalized): Secondary | ICD-10-CM

## 2014-09-17 DIAGNOSIS — R293 Abnormal posture: Secondary | ICD-10-CM

## 2014-09-17 DIAGNOSIS — E8989 Other postprocedural endocrine and metabolic complications and disorders: Secondary | ICD-10-CM

## 2014-09-17 DIAGNOSIS — M7918 Myalgia, other site: Secondary | ICD-10-CM

## 2014-09-17 DIAGNOSIS — M62838 Other muscle spasm: Secondary | ICD-10-CM

## 2014-09-17 DIAGNOSIS — M25611 Stiffness of right shoulder, not elsewhere classified: Secondary | ICD-10-CM

## 2014-09-17 NOTE — Therapy (Signed)
[place], [place], [address] Phone: [phone]   Fax:  [phone]  Physical Therapy Treatment  Patient Details  Name: Karen Hopkins MRN: [id number] Date of Birth: 1976/01/16 Referring Provider:  Aundria Mems, PA-C  Encounter Date: 09/17/2014      PT End of Session - 09/17/14 1024    Visit Number 14   Number of Visits 28   Date for PT Re-Evaluation 10/22/14   PT Start Time 1016   PT Stop Time 1100   PT Time Calculation (min) 44 min      Past Medical History  Diagnosis Date  . Seasonal allergies   . Lymphedema of arm     right; no BP or puncture to right arm  . Depression   . Anxiety   . Sinus headache   . Hypertension     under control with med., has been on med. x 2 yr.  . History of breast cancer 2011    right  . History of chemotherapy 2011  . History of radiation therapy 2011    Past Surgical History  Procedure Laterality Date  . Supra-umbilical hernia  5859  . Nasal septum surgery    . Latissimus flap to breast Right 03/12/2013    Procedure: RIGHT BREAST LATISSIMUS FLAP WITH EXPANDER PLACEMENT;  Surgeon: Theodoro Kos, DO;  Location: [place];  Service: Plastics;  Laterality: Right;  . Cesarean section  07/15/2001; 03/18/2004; 11/21/2008  . Tubal ligation  11/21/2008  . Portacath placement Left 09/12/2009  . Modified radical mastectomy Right 03/11/2010  . Port-a-cath removal Left 12/01/2010  . Removal of tissue expander and placement of implant Right 08/23/2013    Procedure: REMOVAL RIGHT TISSUE EXPANDER AND PLACEMENT OF IMPLANT TO RIGHT BREAST ;  Surgeon: Theodoro Kos, DO;  Location: [place];  Service: Plastics;  Laterality: Right;  . Breast reduction surgery Left 08/23/2013    Procedure: LEFT BREAST REDUCTION  ;  Surgeon: Theodoro Kos, DO;  Location: [place];  Service: Plastics;  Laterality: Left;  . Liposuction Bilateral 08/23/2013    Procedure:  LIPOSUCTION;  Surgeon: Theodoro Kos, DO;  Location: [place];  Service: Plastics;  Laterality: Bilateral;    There were no vitals filed for this visit.  Visit Diagnosis:  Lymphedema of upper extremity following lymphadenectomy  Myofascial muscle pain  Shoulder joint stiffness, right  Muscle spasms of neck  Muscle weakness of right upper extremity  Stiffness of joint, shoulder region, right  Right-sided back pain, unspecified location  Abnormal posture      Subjective Assessment - 09/17/14 1020    Subjective Had blood work done on Friday, havent gotten the results yet from that. They checked my thyroid and checked for diabetes.    Currently in Pain? No/denies                       [facility] Adult PT Treatment/Exercise - 09/17/14 0001    Manual Therapy   Manual Lymphatic Drainage (MLD) In Supine: Short neck, Lt axilla and Rt inguinal nodes, anterior inter-axillary and Rt axillo-inguinal anastomosis and Rt UE from dorsal hand to lateral shoulder.    Compression Bandaging Biotone lotion applied, then bandaging with stockinette, Elastomull on all fingers one -four , Artiflex x 2, and 1-6 cm., and 3-12 cm. bandages from right hand to axilla.                  PT  Short Term Goals - 09/13/14 1210    PT SHORT TERM GOAL #1   Title Patient will report a 25% reduction in pain with usual daily activities and home chores.   Status Achieved   PT SHORT TERM GOAL #2   Title Patient will be able to resume a basic home ex program with minimal increase in pain    Status Achieved   PT SHORT TERM GOAL #3   Title Right shoulder flexion improved to 92 degrees needed for reaching shoulder height shelves.   Status Achieved   PT SHORT TERM GOAL #4   Title Quick DASH score improved from 70.5 "severe difficulty" with ADLs level to 60 indicating improved function with less pain with grooming, household chores and sleep.     Status Achieved         Short  Term Clinic Goals - 09/03/14 1217    CC Short Term Goal  #1   Title short term goals= long term goals          PT Long Term Goals - 09/13/14 1210    PT LONG TERM GOAL #1   Title Independent with progressive HEP for further ROM, strengthening of Right UE.   Time 14   Period Weeks   Status On-going   PT LONG TERM GOAL #2   Title Right shoulder flexion and abduction AROM improved to 105 degrees needed for reaching eye level shelves in the kitchen.     Time 14   Period Weeks   Status On-going   PT LONG TERM GOAL #3   Title Right UE strength grossly 4-/5 needed for basic cooking/cleaning for her 3 children.     Time 14   Period Weeks   Status On-going   PT LONG TERM GOAL #4   Title Quick DASH score improved to 50 indicating "moderate difficulty" with basic ADLS rather than "severe difficulty'"   Time 14   Period Weeks   Status On-going   PT LONG TERM GOAL #5   Title Patient will report overall improvement in pain level 3/10 level or better  and improve  function at 50%.   Time 14   Period Weeks   Status On-going   PT LONG TERM GOAL #6   Title Pt will be able to manage pain at night in order to sleep 4 or more hours of restorative sleep. Pt will verbalize 3 pain management strategies for sleep/rest   Time 14   Period Weeks   Status On-going           Long Term Clinic Goals - 09/13/14 1223    CC Long Term Goal  #4   Title right arm circumference at 15 cm provximal to olecranon will be reduced by 3 sm   Status On-going            Plan - 09/17/14 1100    Clinical Impression Statement Pt awaiting to hear results from blood work done Friday. Reports she was started on fluid pills Friday so her leg swelling is improved but her abdominal swelling seems unchanged.    Pt will benefit from skilled therapeutic intervention in order to improve on the following deficits Impaired UE functional use;Increased fascial restricitons;Increased edema;Impaired flexibility;Postural  dysfunction;Obesity;Decreased scar mobility;Decreased range of motion   Rehab Potential Good   Clinical Impairments Affecting Rehab Potential multiple medical and chronic pain issues   PT Frequency 2x / week   PT Duration 6 weeks   PT Treatment/Interventions ADLs/Self Care Home Management;Dry  needling;Manual techniques;Scar mobilization;Therapeutic exercise;Therapeutic activities;Patient/family education   PT Next Visit Plan Remeasure arm and assess goal for circumference. contiue with Complete Decongestive Therapy as needed   Recommended Other Services Pt is going to call nutritionist   Consulted and Agree with Plan of Care Patient        Problem List Patient Active Problem List   Diagnosis Date Noted  . Breast cancer, right breast 05/24/2014  . Myalgia and myositis 05/17/2014  . Neoplasm related pain 03/12/2014  . CN (constipation) 11/26/2013  . H/O reduction mammoplasty 10/30/2013  . Seizure 10/06/2013  . Status post bilateral breast implants 08/31/2013  . Acquired absence of breast and absent nipple 08/23/2013  . S/P breast reconstruction, right 08/23/2013  . AC joint arthropathy 07/02/2013  . Routine adult health maintenance 05/29/2013  . Hypokalemia 05/21/2013  . Chronic pain 04/19/2013  . Elevated blood sugar 03/09/2013  . Wheezing 02/22/2013  . Acute bronchitis 02/22/2013  . Lymphedema 05/24/2011  . RHINOSINUSITIS, CHRONIC 04/08/2010  . Obesity 08/04/2006  . DEPRESSIVE DISORDER, NOS 08/04/2006  . RHINITIS, ALLERGIC 08/04/2006    Otelia Limes, PTA 09/17/2014, 11:04 AM  [place], [place], [address] Phone: [phone]   Fax:  [phone]

## 2014-09-17 NOTE — Therapy (Signed)
[place], [place], [address] Phone: [phone]   Fax:  [phone]  Physical Therapy Treatment  Patient Details  Name: Karen Hopkins MRN: [id number] Date of Birth: 1975-07-02 Referring Provider:  [name], MD  Encounter Date: 09/17/2014      PT End of Session - 09/17/14 1024    Visit Number 14   Number of Visits 28   Date for PT Re-Evaluation 10/22/14   PT Start Time 1016   PT Stop Time 1100   PT Time Calculation (min) 44 min      Past Medical History  Diagnosis Date  . Seasonal allergies   . Lymphedema of arm     right; no BP or puncture to right arm  . Depression   . Anxiety   . Sinus headache   . Hypertension     under control with med., has been on med. x 2 yr.  . History of breast cancer 2011    right  . History of chemotherapy 2011  . History of radiation therapy 2011    Past Surgical History  Procedure Laterality Date  . Supra-umbilical hernia  8676  . Nasal septum surgery    . Latissimus flap to breast Right 03/12/2013    Procedure: RIGHT BREAST LATISSIMUS FLAP WITH EXPANDER PLACEMENT;  Surgeon: Theodoro Kos, DO;  Location: [place];  Service: Plastics;  Laterality: Right;  . Cesarean section  07/15/2001; 03/18/2004; 11/21/2008  . Tubal ligation  11/21/2008  . Portacath placement Left 09/12/2009  . Modified radical mastectomy Right 03/11/2010  . Port-a-cath removal Left 12/01/2010  . Removal of tissue expander and placement of implant Right 08/23/2013    Procedure: REMOVAL RIGHT TISSUE EXPANDER AND PLACEMENT OF IMPLANT TO RIGHT BREAST ;  Surgeon: Theodoro Kos, DO;  Location: [place];  Service: Plastics;  Laterality: Right;  . Breast reduction surgery Left 08/23/2013    Procedure: LEFT BREAST REDUCTION  ;  Surgeon: Theodoro Kos, DO;  Location: [place];  Service: Plastics;  Laterality: Left;  . Liposuction Bilateral 08/23/2013    Procedure:  LIPOSUCTION;  Surgeon: Theodoro Kos, DO;  Location: [place];  Service: Plastics;  Laterality: Bilateral;    There were no vitals filed for this visit.  Visit Diagnosis:  Myofascial muscle pain  Shoulder joint stiffness, right  Muscle spasms of neck  Muscle weakness of right upper extremity      Subjective Assessment - 09/17/14 1105    Subjective Started on Lasix and has noted decreased LE swelling. Compression wrapping on Right UE.  Liked the deep tissue work last time stating it helped a lot.  Complains of difficulty sleeping secondary to pain.     Currently in Pain? Yes   Pain Score 4   it's hard to tell b/c I'm used to it   Pain Location Shoulder   Pain Orientation Right   Pain Type Chronic pain   Pain Onset More than a month ago   Pain Frequency Constant   Aggravating Factors  night time   Pain Relieving Factors dry needling and deep tissue work                       [facility] Adult PT Treatment/Exercise - 09/17/14 1113    Shoulder Exercises: Supine   Flexion --  Cane 2# press, bent arm, straingh 10x each   Manual Therapy   Manual Therapy Manual Traction;Myofascial release  Myofascial Release Right upper trap and levator; rhomboid;suboccipital release   Manual Traction 3x 30 sec          Trigger Point Dry Needling - 09/17/14 1151    Consent Given? Yes   Muscles Treated Upper Body Upper trapezius;Oblique capitus;Suboccipitals muscle group;Levator scapulae;Rhomboids   Upper Trapezius Response Twitch reponse elicited;Palpable increased muscle length   Oblique Capitus Response Palpable increased muscle length   SubOccipitals Response Palpable increased muscle length   Levator Scapulae Response Twitch response elicited;Palpable increased muscle length   Rhomboids Response Palpable increased muscle length     Right only.           PT Short Term Goals - 09/17/14 1508    PT SHORT TERM GOAL #1   Title Patient will report a 25%  reduction in pain with usual daily activities and home chores.   Status Achieved   PT SHORT TERM GOAL #2   Title Patient will be able to resume a basic home ex program with minimal increase in pain    Status Achieved   PT SHORT TERM GOAL #3   Title Right shoulder flexion improved to 92 degrees needed for reaching shoulder height shelves.   Status Achieved   PT SHORT TERM GOAL #4   Title Quick DASH score improved from 70.5 "severe difficulty" with ADLs level to 60 indicating improved function with less pain with grooming, household chores and sleep.     Status Achieved         Short Term Clinic Goals - 09/03/14 1217    CC Short Term Goal  #1   Title short term goals= long term goals          PT Long Term Goals - 09/17/14 1508    PT LONG TERM GOAL #1   Title Independent with progressive HEP for further ROM, strengthening of Right UE.   Time 14   Period Weeks   Status On-going   PT LONG TERM GOAL #2   Title Right shoulder flexion and abduction AROM improved to 105 degrees needed for reaching eye level shelves in the kitchen.     Time 14   Period Weeks   Status On-going   PT LONG TERM GOAL #3   Title Right UE strength grossly 4-/5 needed for basic cooking/cleaning for her 3 children.     Time 14   Period Weeks   Status On-going   PT LONG TERM GOAL #4   Title Quick DASH score improved to 50 indicating "moderate difficulty" with basic ADLS rather than "severe difficulty'"   Time 14   Period Weeks   Status On-going   PT LONG TERM GOAL #5   Title Patient will report overall improvement in pain level 3/10 level or better  and improve  function at 50%.   Time 14   Period Weeks   Status On-going   PT LONG TERM GOAL #6   Title Pt will be able to manage pain at night in order to sleep 4 or more hours of restorative sleep. Pt will verbalize 3 pain management strategies for sleep/rest   Time 14   Period Weeks   Status On-going           Long Term Clinic Goals - 09/13/14  1223    CC Long Term Goal  #4   Title right arm circumference at 15 cm provximal to olecranon will be reduced by 3 sm   Status On-going  Plan - 09/17/14 1504    Clinical Impression Statement Patient reports a decreased back pain with lying supine with wedge rather than pillows stacked under her head.  She feels that doing so has aggravated her neck at night time.   She plans to check for a similar wedge at the medical supply store.  Patient reports she can move her arm better with the compression wrap on.  Improved mobility with less pain following dry needling and deep tissue mobilization.     PT Next Visit Plan Continue with gravity assisted shoulder ROM to decrease upper trap overcompensation;  manual therapy, dry needling;  recheck progress toward goals        Problem List Patient Active Problem List   Diagnosis Date Noted  . Breast cancer, right breast 05/24/2014  . Myalgia and myositis 05/17/2014  . Neoplasm related pain 03/12/2014  . CN (constipation) 11/26/2013  . H/O reduction mammoplasty 10/30/2013  . Seizure 10/06/2013  . Status post bilateral breast implants 08/31/2013  . Acquired absence of breast and absent nipple 08/23/2013  . S/P breast reconstruction, right 08/23/2013  . AC joint arthropathy 07/02/2013  . Routine adult health maintenance 05/29/2013  . Hypokalemia 05/21/2013  . Chronic pain 04/19/2013  . Elevated blood sugar 03/09/2013  . Wheezing 02/22/2013  . Acute bronchitis 02/22/2013  . Lymphedema 05/24/2011  . RHINOSINUSITIS, CHRONIC 04/08/2010  . Obesity 08/04/2006  . DEPRESSIVE DISORDER, NOS 08/04/2006  . RHINITIS, ALLERGIC 08/04/2006    Alvera Singh 09/17/2014, 3:10 PM  [place], [place], [address] Phone: [phone]   Fax:  [phone]    Ruben Im, PT 09/17/2014 3:10 PM Phone: [phone] Fax: [phone]

## 2014-09-17 NOTE — Patient Instructions (Signed)
Discussed use of wedge to help sleep with elevation instead of stacking pillows.

## 2014-09-19 ENCOUNTER — Telehealth: Payer: Self-pay | Admitting: Oncology

## 2014-09-19 ENCOUNTER — Other Ambulatory Visit: Payer: Self-pay | Admitting: *Deleted

## 2014-09-19 ENCOUNTER — Ambulatory Visit (HOSPITAL_BASED_OUTPATIENT_CLINIC_OR_DEPARTMENT_OTHER): Payer: Medicare Other | Admitting: Oncology

## 2014-09-19 ENCOUNTER — Telehealth: Payer: Self-pay | Admitting: *Deleted

## 2014-09-19 VITALS — BP 137/98 | HR 100 | Temp 97.7°F | Resp 18 | Ht 64.0 in | Wt 287.7 lb

## 2014-09-19 DIAGNOSIS — C50911 Malignant neoplasm of unspecified site of right female breast: Secondary | ICD-10-CM

## 2014-09-19 DIAGNOSIS — F329 Major depressive disorder, single episode, unspecified: Secondary | ICD-10-CM

## 2014-09-19 DIAGNOSIS — I890 Lymphedema, not elsewhere classified: Secondary | ICD-10-CM

## 2014-09-19 DIAGNOSIS — E119 Type 2 diabetes mellitus without complications: Secondary | ICD-10-CM | POA: Diagnosis not present

## 2014-09-19 LAB — BENZODIAZEPINES (GC/LC/MS), URINE
Alprazolam metabolite (GC/LC/MS), ur confirm: 133 ng/mL (ref <25)
Clonazepam metabolite (GC/LC/MS), ur confirm: NEGATIVE ng/mL (ref <25)
Flurazepam metabolite (GC/LC/MS), ur confirm: NEGATIVE ng/mL (ref <50)
Lorazepam (GC/LC/MS), ur confirm: NEGATIVE ng/mL (ref <50)
Midazolam (GC/LC/MS), ur confirm: NEGATIVE ng/mL (ref <50)
NORDIAZEPAMU: NEGATIVE ng/mL (ref <50)
OXAZEPAMU: NEGATIVE ng/mL (ref <50)
TRIAZOLAMU: NEGATIVE ng/mL (ref <50)
Temazepam (GC/LC/MS), ur confirm: NEGATIVE ng/mL (ref <50)

## 2014-09-19 LAB — OXYCODONE, URINE (LC/MS-MS)
NOROXYCODONE, UR: 171 ng/mL (ref <50)
Oxycodone, ur: 70 ng/mL (ref <50)
Oxymorphone: 180 ng/mL (ref <50)

## 2014-09-19 NOTE — Telephone Encounter (Signed)
per pof to sh pt appt-pof did not come through-Val gave verbal pof on phone-sch appt per Val & gave pt copy of sch

## 2014-09-19 NOTE — Progress Notes (Signed)
ID: Karen Hopkins   DOB: 06-Jan-1976  MR#: [id number]  VEL#:[id number]  PCP: Karen Netters, MD GYN: SU: Karen Coho, MD;  Karen Kos, MD OTHER MD: Karen Penna, MD;  Karen Nakayama, MD  CHIEF COMPLAINT:  Locally advanced right breast cancer  CURRENT TREATMENT: Tamoxifen; goserelin  HISTORY OF PRESENT ILLNESS: From the original intake note:  The patient noted swelling and redness, as well as some tenderness, over her right breast for some time. She thought this might be related to pregnancy, since she delivered her third child about 8 months ago. As the problem did not improve, she brought it to her primary care physician's attention. He treated her with Bactrim, which she says she could not tolerate because of nausea and vomiting, but which in any case did not resolve the problem, so he set her up for mammography at [facility] 08/26/2009. Dr. Sadie Hopkins found by exam marked skin thickening and erythema of the right breast extending pretty much throughout the breast. By mammography there were scattered fibroglandular densities, and an asymmetric density in the right upper outer quadrant with suspicious microcalcifications. There were also enlarged right axillary lymph nodes. The left appeared normal. By ultrasound there was an ill defined area of hypoechoic tissue in the right upper outer quadrant that was difficult to measure. There were also abnormal right axillary lymph nodes.  Ultrasound guided biopsy was performed the same day, and showed [phone]) a poorly differentiated invasive ductal carcinoma, which was estrogen receptor poor at 3%, progesterone receptor positive at 14% with an elevated proliferation marker at 50%, but importantly with strong amplification of HER-2 by CISH with a ratio of 5.20.  Bilateral breast MRIs were obtained 09/02/2009. This showed an area up to 10.6 cm in the right breast showing confluent mass and non-mass enhancement. There was marked skin  thickening involving the entire right breast, and numerous bulky right axillary lymph nodes were identified. The left breast was unremarkable, and there was no evidence of internal mammary lymph node involvement.  Patient was treated in the neoadjuvant setting with 4 dose dense cycles of doxorubicin and cyclophosphamide, followed by 12 weekly doses of paclitaxel and trastuzumab. Karen Hopkins underwent definitive right modified radical mastectomy in October 2011, with a residual 2.5 mm area of tumor in the breast, grade 3, and one of 21 lymph nodes involved.   Her subsequent history is as detailed below  INTERVAL HISTORY: Karen Hopkins returns today for follow-up of her breast cancer. She continues on tamoxifen, with good tolerance. Since her last visit here she has developed some ankle swelling. Her physical therapist suggested we check a thyroid panel which we did and is normal. They also suggested we check a hemoglobin A1c which is 6.8.  REVIEW OF SYSTEMS:  Karen Hopkins is receiving physical therapy for her left upper extremity and the swelling there is improving. She is having bilateral ankle swelling, which is pitting. This bothers her. She was started on Lasix for this. It is helping some. She is going to the pain clinic with very good results. She is very frustrated because every time she goes to the colon primary care clinic she sees a different physician. She would like to have her own doctor that gets to know her. When she had subduing is using ice as her primary care physician. She is aware of of this in today we discussed what she can do to find her self a physician, since she does have Medicare and Medicaid both.--She changed her diet and started going  to the gym, and then her ankle started to swell. This is very discouraging to her. Her weight is actually going up. She has some sinus problems. She feels short of breath when walking on any slope or stairs. She has back and joint pain, which is not more intense or  persistent than before. She has occasional headaches. She is anxious and depressed. Otherwise a detailed review of systems today was stable .  PAST MEDICAL HISTORY: Past Medical History  Diagnosis Date  . Seasonal allergies   . Lymphedema of arm     right; no BP or puncture to right arm  . Depression   . Anxiety   . Sinus headache   . Hypertension     under control with med., has been on med. x 2 yr.  . History of breast cancer 2011    right  . History of chemotherapy 2011  . History of radiation therapy 2011    PAST SURGICAL HISTORY: Past Surgical History  Procedure Laterality Date  . Supra-umbilical hernia  8119  . Nasal septum surgery    . Latissimus flap to breast Right 03/12/2013    Procedure: RIGHT BREAST LATISSIMUS FLAP WITH EXPANDER PLACEMENT;  Surgeon: Karen Kos, DO;  Location: [facility];  Service: Plastics;  Laterality: Right;  . Cesarean section  07/15/2001; 03/18/2004; 11/21/2008  . Tubal ligation  11/21/2008  . Portacath placement Left 09/12/2009  . Modified radical mastectomy Right 03/11/2010  . Port-a-cath removal Left 12/01/2010  . Removal of tissue expander and placement of implant Right 08/23/2013    Procedure: REMOVAL RIGHT TISSUE EXPANDER AND PLACEMENT OF IMPLANT TO RIGHT BREAST ;  Surgeon: Karen Kos, DO;  Location: [place];  Service: Plastics;  Laterality: Right;  . Breast reduction surgery Left 08/23/2013    Procedure: LEFT BREAST REDUCTION  ;  Surgeon: Karen Kos, DO;  Location: [facility];  Service: Plastics;  Laterality: Left;  . Liposuction Bilateral 08/23/2013    Procedure: LIPOSUCTION;  Surgeon: Karen Kos, DO;  Location: [place];  Service: Plastics;  Laterality: Bilateral;    FAMILY HISTORY Family History  Problem Relation Age of Onset  . Hypertension Mother   . Diabetes type II Father   . Prostate cancer Father   There is no history of breast or ovarian cancer in the immediate family, but of  the patient's mother's mother's six sisters, two (the patient's great-aunts) had ovarian cancer.  GYNECOLOGIC HISTORY:   (Updated 11/26/2013) The patient is GX, P3. First child was premature. Age at first delivery, 39 years old.   she status post tubal ligation. She continues to have menstrual cycles, although irregularly.  SOCIAL HISTORY: (Updated 11/26/2013) She is currently disabled. Karen Hopkins's children are [name], and [name]. They are 11, 10 and 5 as of DEC 2015. They are all boys, all at home. The patient attends a non-denominational church in [place], which she considers her home, but she is currently living in [place].   ADVANCED DIRECTIVES: not in place  HEALTH MAINTENANCE:  (Updated 11/26/2013) History  Substance Use Topics  . Smoking status: Former Smoker -- 0.25 packs/day for 5 years    Quit date: 04/26/2010  . Smokeless tobacco: Never Used  . Alcohol Use: No    Colonoscopy: Never  PAP: April 2015  Bone density: Never  Lipid panel: August 2014    Allergies  Allergen Reactions  . Lyrica [Pregabalin] Nausea Only    .  Marland Kitchen Meloxicam Nausea Only  .  Penicillins Swelling    FACIAL SWELLING  . Robaxin [Methocarbamol] Nausea Only    Current Outpatient Prescriptions  Medication Sig Dispense Refill  . ALPRAZolam (XANAX) 1 MG tablet Take 1 tablet (1 mg total) by mouth 2 (two) times daily. 60 tablet 3  . citalopram (CELEXA) 20 MG tablet TAKE ONE (1) TABLET BY MOUTH EVERY DAY 30 tablet 3  . furosemide (LASIX) 20 MG tablet Take 1 tablet (20 mg total) by mouth daily. 30 tablet 3  . metFORMIN (GLUCOPHAGE) 500 MG tablet Take 1 tablet (500 mg total) by mouth daily with breakfast. 30 tablet 3  . naproxen (NAPROSYN) 500 MG tablet TAKE ONE (1) TABLET BY MOUTH TWO (2) TIMES DAILY AS NEEDED FOR MODERATE PAIN/HEADACHE 90 tablet 2  . oxyCODONE (OXY IR/ROXICODONE) 5 MG immediate release tablet Take 1 tablet (5 mg total) by mouth every 6 (six) hours as needed for severe pain. 120  tablet 0  . polyethylene glycol powder (GLYCOLAX) powder Take 17 g by mouth daily. 500 g 5  . potassium chloride (MICRO-K) 10 MEQ CR capsule Take 1 capsule (10 mEq total) by mouth 2 (two) times daily. 180 capsule 3  . tamoxifen (NOLVADEX) 20 MG tablet Take 1 tablet (20 mg total) by mouth daily. 90 tablet 3  . tiZANidine (ZANAFLEX) 4 MG tablet Take 1 tablet (4 mg total) by mouth 3 (three) times daily. 90 tablet 2  . triamterene-hydrochlorothiazide (MAXZIDE-25) 37.5-25 MG per tablet TAKE ONE (1) TABLET BY MOUTH EVERY DAY 30 tablet 3   No current facility-administered medications for this visit.    OBJECTIVE: Young African American woman in no acute distress Filed Vitals:   09/19/14 1042  BP: 137/98  Pulse: 100  Temp: 97.7 F (36.5 C)  Resp: 18     Body mass index is 49.36 kg/(m^2).    ECOG FS: 2   Filed Weights   09/19/14 1042  Weight: 287 lb 11.2 oz (130.5 kg)   Sclerae unicteric, pupils equal and reactive Oropharynx clear and moist No cervical or supraclavicular adenopathy Lungs no rales or rhonchi Heart regular rate and rhythm Abd soft, nontender, positive bowel sounds MSK no focal spinal tenderness, no upper extremity lymphedema Neuro: nonfocal, well oriented, appropriate affect Breasts: The right breast status post mastectomy. There is no evidence of local recurrence. The right axilla is benign per the left breast is unremarkable.  LAB RESULTS: Lab Results  Component Value Date   WBC 10.7* 09/12/2014   NEUTROABS 6.8* 09/12/2014   HGB 12.5 09/12/2014   HCT 38.6 09/12/2014   MCV 86.4 09/12/2014   PLT 312 09/12/2014      Chemistry      Component Value Date/Time   NA 139 09/12/2014 1135   NA 139 08/20/2013 1155   K 3.7 09/12/2014 1135   K 3.6* 08/20/2013 1155   CL 100 08/20/2013 1155   CL 105 09/27/2012 1044   CO2 23 09/12/2014 1135   CO2 26 08/20/2013 1155   BUN 15.3 09/12/2014 1135   BUN 10 08/20/2013 1155   CREATININE 0.8 09/12/2014 1135   CREATININE 0.67  08/20/2013 1155      Component Value Date/Time   CALCIUM 9.3 09/12/2014 1135   CALCIUM 9.5 08/20/2013 1155   ALKPHOS 76 09/12/2014 1135   ALKPHOS 65 11/24/2011 0903   AST 18 09/12/2014 1135   AST 13 11/24/2011 0903   ALT 21 09/12/2014 1135   ALT 13 11/24/2011 0903   BILITOT 0.20 09/12/2014 1135   BILITOT 0.1* 11/24/2011  0903     CLINICAL DATA: Malignant right mastectomy 2011, pathology high-grade invasive ductal carcinoma and DCIS with lymphovascular invasion and 1/20 positive left axillary lymph nodes. Neoadjuvant chemotherapy and adjuvant radiation therapy. Left breast reduction in March, 2015. Patient complains of skin thickening in the lower inner left breast since the time of her reduction surgery which has not changed.  EXAM: DIGITAL DIAGNOSTIC LEFT MAMMOGRAM WITH CAD  ULTRASOUND LEFT BREAST  COMPARISON: Mammography 04/16/2014 and earlier. Left breast ultrasound 04/16/2014.  ACR Breast Density Category b: There are scattered areas of fibroglandular density.  FINDINGS: CC and MLO views of the left breast were obtained. Persistent mild skin thickening involving the lower and inner left breast, unchanged since the examination 4 months ago. Stable post surgical scarring involving the lower left breast. No findings suspicious for malignancy in the left breast.  Mammographic images were processed with CAD.  On physical exam, there is no palpable mass in the inner left breast in the area of skin thickening.  Targeted ultrasound is performed, showing persistent skin thickening involving the lower inner quadrant of the left breast, the skin measuring up to 6 mm in thickness, with visible edema. Benign oil cysts in the upper outer quadrant are again demonstrated and unchanged, the largest at the 10 o'clock position approximately 4 cm from the nipple measuring approximately 4 mm. No suspicious solid mass or abnormal acoustic shadowing is  identified.  IMPRESSION: 1. No mammographic or sonographic evidence of malignancy, left breast. 2. Stable skin thickening/edema involving the lower inner quadrant of the left breast in the area of patient concern. 3. Stable benign fat necrosis/oil cysts in the upper outer quadrant of the left breast.  RECOMMENDATION: Screening mammogram in one year.(Code:SM-B-01Y)  I have discussed the findings and recommendations with the patient. Results were also provided in writing at the conclusion of the visit. If applicable, a reminder letter will be sent to the patient regarding the next appointment.  BI-RADS CATEGORY 2: Benign.   Electronically Signed  By: Evangeline Dakin M.D.  On: 08/13/2014 11:52       ASSESSMENT: 39 y.o.  BRCA1 and BRCA2 negative [place] woman, status post right breast biopsy in March 2011 for a high-grade invasive ductal carcinoma, ER positive 3%, PR positive 14%, strongly HER2/neu positive with MIB-1 of 15%. Biopsy proven lymph node involvement at presentation. Changes consistent with inflammatory carcinoma.   1. Treated neoadjuvantly, status post 4 dose-dense cycles of doxorubicin/cyclophosphamide, followed by 12 weekly doses of paclitaxel with trastuzumab completed September 2011.   2. Trastuzumab was then continued for a total of 1 year [to June 2012]. Post-treatment echo showed a well-preserved ejection fraction.   3. Status post right modified radical mastectomy in October 2011 showing a ypT1a ypN1 invasive ductal carcinoma, grade 3.  Status post right reconstruction with implant, March 2015   4. postmastectomy radiation completed in January 2012  5. on tamoxifen beginning January of 2012  6. other problems include chronic postsurgical pain, chronic right upper extremity lymphedema, and chronic depression  7. Goserelin started 09/24/2014, to be repeated every 4 weeks  PLAN: Karen Hopkins is doing well from a breast cancer point of view. She is  developing diabetes. We went into the fact that if she really wants to get rid of that problem she will have to exercise and diet. She is discouraged because she did exercise and started dieting and then her ankle started swelling. I'm referring her to the diabetes and nutrition counseling program at [facility].  She  really would like to have her own primary care physician. I agree that this would be helpful to her. She is going to be calling Medicare and Medicaid to see which physicians in [place] are accepting patients without insurance.  Over the last couple of years studies have shown that young women who have their periods recurred after chemotherapy do better on Zoladex, and we will be starting that next week. She is actually very much in favor of that simply because she has such problems from her periods. We did discuss that this may exacerbate her postmenopausal symptoms.   She will let me know she has any problems before the next visit here, which will be in 6 months.   Chauncey Cruel, MD      09/19/2014

## 2014-09-19 NOTE — Telephone Encounter (Signed)
per pof ot sch p w/ [facility] appt made

## 2014-09-19 NOTE — Telephone Encounter (Signed)
Call received from [facility] asking about diabetic referral and injection appointments.  Explained referral has been made and she will be contacted by diabetic office when appointment made.  Injections present on schedule screen by end of conversation beginning 09-24-2014 at 1:00 pm.  Instructed to obtain new calendar when she returns to [facility].

## 2014-09-20 ENCOUNTER — Ambulatory Visit: Payer: Medicare Other | Admitting: Physical Therapy

## 2014-09-20 DIAGNOSIS — M25611 Stiffness of right shoulder, not elsewhere classified: Secondary | ICD-10-CM

## 2014-09-20 DIAGNOSIS — M5489 Other dorsalgia: Secondary | ICD-10-CM

## 2014-09-20 DIAGNOSIS — M6281 Muscle weakness (generalized): Secondary | ICD-10-CM

## 2014-09-20 DIAGNOSIS — E8989 Other postprocedural endocrine and metabolic complications and disorders: Secondary | ICD-10-CM | POA: Diagnosis not present

## 2014-09-20 DIAGNOSIS — M7918 Myalgia, other site: Secondary | ICD-10-CM

## 2014-09-20 DIAGNOSIS — M62838 Other muscle spasm: Secondary | ICD-10-CM

## 2014-09-20 DIAGNOSIS — I890 Lymphedema, not elsewhere classified: Principal | ICD-10-CM

## 2014-09-20 LAB — PRESCRIPTION MONITORING PROFILE (SOLSTAS)
Amphetamine/Meth: NEGATIVE ng/mL
Barbiturate Screen, Urine: NEGATIVE ng/mL
Buprenorphine, Urine: NEGATIVE ng/mL
CANNABINOID SCRN UR: NEGATIVE ng/mL
Carisoprodol, Urine: NEGATIVE ng/mL
Cocaine Metabolites: NEGATIVE ng/mL
Creatinine, Urine: 52.46 mg/dL (ref >20.0)
FENTANYL URINE: NEGATIVE ng/mL
MDMA URINE: NEGATIVE ng/mL
MEPERIDINE UR: NEGATIVE ng/mL
METHADONE SCREEN, URINE: NEGATIVE ng/mL
Nitrites, Initial: NEGATIVE ug/mL
Opiate Screen, Urine: NEGATIVE ng/mL
Propoxyphene: NEGATIVE ng/mL
TAPENTADOLUR: NEGATIVE ng/mL
Tramadol Scrn, Ur: NEGATIVE ng/mL
Zolpidem, Urine: NEGATIVE ng/mL
pH, Initial: 8.8 pH (ref 4.5–8.9)

## 2014-09-20 NOTE — Therapy (Signed)
[place], [place], [address] Phone: [phone]   Fax:  [phone]  Physical Therapy Treatment  Patient Details  Name: Karen Hopkins MRN: [id number] Date of Birth: 01-22-1976 Referring Provider:  [name], MD  Encounter Date: 09/20/2014      PT End of Session - 09/20/14 1120    Visit Number 15   Number of Visits 28   Date for PT Re-Evaluation 10/22/14   PT Start Time 1017   PT Stop Time 1115   PT Time Calculation (min) 58 min   Activity Tolerance Patient tolerated treatment well      Past Medical History  Diagnosis Date  . Seasonal allergies   . Lymphedema of arm     right; no BP or puncture to right arm  . Depression   . Anxiety   . Sinus headache   . Hypertension     under control with med., has been on med. x 2 yr.  . History of breast cancer 2011    right  . History of chemotherapy 2011  . History of radiation therapy 2011    Past Surgical History  Procedure Laterality Date  . Supra-umbilical hernia  6063  . Nasal septum surgery    . Latissimus flap to breast Right 03/12/2013    Procedure: RIGHT BREAST LATISSIMUS FLAP WITH EXPANDER PLACEMENT;  Surgeon: Theodoro Kos, DO;  Location: [facility];  Service: Plastics;  Laterality: Right;  . Cesarean section  07/15/2001; 03/18/2004; 11/21/2008  . Tubal ligation  11/21/2008  . Portacath placement Left 09/12/2009  . Modified radical mastectomy Right 03/11/2010  . Port-a-cath removal Left 12/01/2010  . Removal of tissue expander and placement of implant Right 08/23/2013    Procedure: REMOVAL RIGHT TISSUE EXPANDER AND PLACEMENT OF IMPLANT TO RIGHT BREAST ;  Surgeon: Theodoro Kos, DO;  Location: [facility];  Service: Plastics;  Laterality: Right;  . Breast reduction surgery Left 08/23/2013    Procedure: LEFT BREAST REDUCTION  ;  Surgeon: Theodoro Kos, DO;  Location: [facility];  Service: Plastics;  Laterality:  Left;  . Liposuction Bilateral 08/23/2013    Procedure: LIPOSUCTION;  Surgeon: Theodoro Kos, DO;  Location: [facility];  Service: Plastics;  Laterality: Bilateral;    There were no vitals filed for this visit.  Visit Diagnosis:  Myofascial muscle pain  Shoulder joint stiffness, right  Muscle spasms of neck  Muscle weakness of right upper extremity  Stiffness of joint, shoulder region, right  Right-sided back pain, unspecified location      Subjective Assessment - 09/20/14 1023    Subjective Patient presents following compression wrapping.  States she is doing better with myofascial pain.  Complains of right upper trap knot/hardness.     Pain Score 4    Pain Location Neck   Pain Orientation Right   Aggravating Factors  UE use; nights   Pain Relieving Factors needling; deep tissue work helps            [facility] PT Assessment - 09/20/14 1127    Strength   Right Shoulder Flexion 3/5   Right Shoulder Extension 3/5   Right Shoulder ABduction 3/5   Right Shoulder Internal Rotation 3/5   Right Shoulder External Rotation 3/5           LYMPHEDEMA/ONCOLOGY QUESTIONNAIRE - 09/20/14 0953    Right Upper Extremity Lymphedema   At Axilla  45 cm   15 cm Proximal to Olecranon  Process 50.7 cm   10 cm Proximal to Olecranon Process 49.5 cm   Olecranon Process 33 cm   15 cm Proximal to Ulnar Styloid Process 33.5 cm   10 cm Proximal to Ulnar Styloid Process 29.5 cm   Just Proximal to Ulnar Styloid Process 19 cm   Across Hand at [employer] 21.5 cm   At [place] of 2nd Digit 6.2 cm                [facility] Adult PT Treatment/Exercise - 09/20/14 1030    Shoulder Exercises: Supine   Horizontal ABduction Strengthening;Right;Left;Both;15 reps;Theraband   Theraband Level (Shoulder Horizontal ABduction) Level 1 (Yellow)   External Rotation Strengthening;Right;Left;Both;15 reps;Theraband   Theraband Level (Shoulder External Rotation) Level 1 (Yellow)   Flexion --   narrow and wide HABD with flexion 15x   Other Supine Exercises diagonal flexion yellow band 10 right/left   Manual Therapy   Manual Therapy Myofascial release   Myofascial Release Right upper trap and levator; rhomboid;suboccipital release          Trigger Point Dry Needling - 09/20/14 1035    Consent Given? Yes   Muscles Treated Upper Body Upper trapezius;Levator scapulae;Rhomboids;Oblique capitus   Upper Trapezius Response Twitch reponse elicited;Palpable increased muscle length   Oblique Capitus Response Palpable increased muscle length   Levator Scapulae Response Twitch response elicited;Palpable increased muscle length   Rhomboids Response Palpable increased muscle length                PT Short Term Goals - 09/20/14 1122    PT SHORT TERM GOAL #1   Title Patient will report a 25% reduction in pain with usual daily activities and home chores.   Status Achieved   PT SHORT TERM GOAL #2   Title Patient will be able to resume a basic home ex program with minimal increase in pain    Status Achieved   PT SHORT TERM GOAL #3   Title Right shoulder flexion improved to 92 degrees needed for reaching shoulder height shelves.   Status Achieved   PT SHORT TERM GOAL #4   Title Quick DASH score improved from 70.5 "severe difficulty" with ADLs level to 60 indicating improved function with less pain with grooming, household chores and sleep.     Status Achieved         Short Term Clinic Goals - 09/03/14 1217    CC Short Term Goal  #1   Title short term goals= long term goals          PT Long Term Goals - 09/20/14 1123    PT LONG TERM GOAL #1   Title Independent with progressive HEP for further ROM, strengthening of Right UE.   Time 14   Period Weeks   Status On-going   PT LONG TERM GOAL #2   Title Right shoulder flexion and abduction AROM improved to 105 degrees needed for reaching eye level shelves in the kitchen.     Time 14   Period Weeks   Status On-going   PT  LONG TERM GOAL #3   Title Right UE strength grossly 4-/5 needed for basic cooking/cleaning for her 3 children.     Time 14   Period Weeks   Status On-going   PT LONG TERM GOAL #4   Title Quick DASH score improved to 50 indicating "moderate difficulty" with basic ADLS rather than "severe difficulty'"   Time 14   Period Weeks   Status On-going  PT LONG TERM GOAL #5   Title Patient will report overall improvement in pain level 3/10 level or better  and improve  function at 50%.   Time 14   Period Weeks   PT LONG TERM GOAL #6   Title Pt will be able to manage pain at night in order to sleep 4 or more hours of restorative sleep. Pt will verbalize 3 pain management strategies for sleep/rest   Time 14   Period Weeks   Status On-going           Long Term Clinic Goals - 09/13/14 1223    CC Long Term Goal  #4   Title right arm circumference at 15 cm provximal to olecranon will be reduced by 3 sm   Status On-going            Plan - 09/20/14 1121    Clinical Impression Statement Patient with improved upper trap and levator muscle lengths following treatment session.  Patient states she has good relief from needling and manual interventions.  Noted improved scapular mobility.  Therapist monitoring response continually.      PT Next Visit Plan Continue with gravity assisted shoulder ROM to decrease upper trap overcompensation;  manual therapy, dry needling;  recheck progress toward goals and shoulder ROM        Problem List Patient Active Problem List   Diagnosis Date Noted  . Diabetes mellitus type II, non insulin dependent 09/19/2014  . Breast cancer, right breast 05/24/2014  . Myalgia and myositis 05/17/2014  . Neoplasm related pain 03/12/2014  . CN (constipation) 11/26/2013  . H/O reduction mammoplasty 10/30/2013  . Seizure 10/06/2013  . Status post bilateral breast implants 08/31/2013  . Acquired absence of breast and absent nipple 08/23/2013  . S/P breast  reconstruction, right 08/23/2013  . AC joint arthropathy 07/02/2013  . Routine adult health maintenance 05/29/2013  . Hypokalemia 05/21/2013  . Chronic pain 04/19/2013  . Elevated blood sugar 03/09/2013  . Wheezing 02/22/2013  . Acute bronchitis 02/22/2013  . Lymphedema 05/24/2011  . RHINOSINUSITIS, CHRONIC 04/08/2010  . Obesity 08/04/2006  . DEPRESSIVE DISORDER, NOS 08/04/2006  . RHINITIS, ALLERGIC 08/04/2006    Alvera Singh 09/20/2014, 11:28 AM  [facility] [address] [place], [place], [address] Phone: [phone]   Fax:  [phone]   Ruben Im, PT 09/20/2014 11:28 AM Phone: [phone] Fax: [phone]

## 2014-09-20 NOTE — Therapy (Signed)
[place], [place], [address] Phone: [phone]   Fax:  [phone]  Physical Therapy Treatment  Patient Details  Name: Karen Hopkins MRN: [id number] Date of Birth: 1975-08-31 Referring Provider:  Chauncey Cruel, MD  Encounter Date: 09/20/2014      PT End of Session - 09/20/14 1229    Visit Number 16   Number of Visits 28   Date for PT Re-Evaluation 10/22/14   PT Start Time 0934   PT Stop Time 1016   PT Time Calculation (min) 42 min      Past Medical History  Diagnosis Date  . Seasonal allergies   . Lymphedema of arm     right; no BP or puncture to right arm  . Depression   . Anxiety   . Sinus headache   . Hypertension     under control with med., has been on med. x 2 yr.  . History of breast cancer 2011    right  . History of chemotherapy 2011  . History of radiation therapy 2011    Past Surgical History  Procedure Laterality Date  . Supra-umbilical hernia  5573  . Nasal septum surgery    . Latissimus flap to breast Right 03/12/2013    Procedure: RIGHT BREAST LATISSIMUS FLAP WITH EXPANDER PLACEMENT;  Surgeon: Theodoro Kos, DO;  Location: [facility];  Service: Plastics;  Laterality: Right;  . Cesarean section  07/15/2001; 03/18/2004; 11/21/2008  . Tubal ligation  11/21/2008  . Portacath placement Left 09/12/2009  . Modified radical mastectomy Right 03/11/2010  . Port-a-cath removal Left 12/01/2010  . Removal of tissue expander and placement of implant Right 08/23/2013    Procedure: REMOVAL RIGHT TISSUE EXPANDER AND PLACEMENT OF IMPLANT TO RIGHT BREAST ;  Surgeon: Theodoro Kos, DO;  Location: [facility];  Service: Plastics;  Laterality: Right;  . Breast reduction surgery Left 08/23/2013    Procedure: LEFT BREAST REDUCTION  ;  Surgeon: Theodoro Kos, DO;  Location: [facility];  Service: Plastics;  Laterality: Left;  . Liposuction Bilateral 08/23/2013    Procedure:  LIPOSUCTION;  Surgeon: Theodoro Kos, DO;  Location: [facility];  Service: Plastics;  Laterality: Bilateral;    There were no vitals filed for this visit.  Visit Diagnosis:  Lymphedema of upper extremity following lymphadenectomy      Subjective Assessment - 09/20/14 1023    Subjective Patient presents following compression wrapping.  States she is doing better with myofascial pain.  Complains of right upper trap knot/hardness.     Pain Score 4    Pain Location Neck   Pain Orientation Right   Aggravating Factors  UE use; nights   Pain Relieving Factors needling; deep tissue work helps            [facility] PT Assessment - 09/20/14 1127    Strength   Right Shoulder Flexion 3/5   Right Shoulder Extension 3/5   Right Shoulder ABduction 3/5   Right Shoulder Internal Rotation 3/5   Right Shoulder External Rotation 3/5           LYMPHEDEMA/ONCOLOGY QUESTIONNAIRE - 09/20/14 0953    Right Upper Extremity Lymphedema   At Axilla  45 cm   15 cm Proximal to Olecranon Process 50.7 cm   10 cm Proximal to Olecranon Process 49.5 cm   Olecranon Process 33 cm   15 cm Proximal to Ulnar Styloid Process 33.5 cm   10 cm Proximal  to Ulnar Styloid Process 29.5 cm   Just Proximal to Ulnar Styloid Process 19 cm   Across Hand at [employer] 21.5 cm   At [place] of 2nd Digit 6.2 cm                [facility] Adult PT Treatment/Exercise - 09/20/14 1228    Manual Therapy   Compression Bandaging Biotone lotion applied, then bandaging with stockinette, Elastomull on all fingers one -four , Artiflex x 2, and 1-6 cm., and 3-12 cm. bandages from right hand to axilla.          Trigger Point Dry Needling - 09/20/14 1035    Consent Given? Yes   Muscles Treated Upper Body Upper trapezius;Levator scapulae;Rhomboids;Oblique capitus   Upper Trapezius Response Twitch reponse elicited;Palpable increased muscle length   Oblique Capitus Response Palpable increased muscle length   Levator  Scapulae Response Twitch response elicited;Palpable increased muscle length   Rhomboids Response Palpable increased muscle length              PT Education - 09/20/14 1229    Education provided Yes   Education Details encouraged pt to use flexitouch, compression sleeve and continue exercise at home   Balon(s) Educated Patient   Methods Explanation   Comprehension Verbalized understanding          PT Short Term Goals - 09/20/14 1122    PT SHORT TERM GOAL #1   Title Patient will report a 25% reduction in pain with usual daily activities and home chores.   Status Achieved   PT SHORT TERM GOAL #2   Title Patient will be able to resume a basic home ex program with minimal increase in pain    Status Achieved   PT SHORT TERM GOAL #3   Title Right shoulder flexion improved to 92 degrees needed for reaching shoulder height shelves.   Status Achieved   PT SHORT TERM GOAL #4   Title Quick DASH score improved from 70.5 "severe difficulty" with ADLs level to 60 indicating improved function with less pain with grooming, household chores and sleep.     Status Achieved         Short Term Clinic Goals - 09/03/14 1217    CC Short Term Goal  #1   Title short term goals= long term goals          PT Long Term Goals - 09/20/14 1123    PT LONG TERM GOAL #1   Title Independent with progressive HEP for further ROM, strengthening of Right UE.   Time 14   Period Weeks   Status On-going   PT LONG TERM GOAL #2   Title Right shoulder flexion and abduction AROM improved to 105 degrees needed for reaching eye level shelves in the kitchen.     Time 14   Period Weeks   Status On-going   PT LONG TERM GOAL #3   Title Right UE strength grossly 4-/5 needed for basic cooking/cleaning for her 3 children.     Time 14   Period Weeks   Status On-going   PT LONG TERM GOAL #4   Title Quick DASH score improved to 50 indicating "moderate difficulty" with basic ADLS rather than "severe difficulty'"    Time 14   Period Weeks   Status On-going   PT LONG TERM GOAL #5   Title Patient will report overall improvement in pain level 3/10 level or better  and improve  function at 50%.   Time  14   Period Weeks   PT LONG TERM GOAL #6   Title Pt will be able to manage pain at night in order to sleep 4 or more hours of restorative sleep. Pt will verbalize 3 pain management strategies for sleep/rest   Time 14   Period Weeks   Status On-going           Long Term Clinic Goals - 09/20/14 1234    CC Long Term Goal  #4   Title right arm circumference at 15 cm provximal to olecranon will be reduced by 3 sm   Status Partially Met            Plan - 09/20/14 1230    Clinical Impression Statement pt continues to express concern about her situation and possibility of diabetes.  she needs more eduction about this disease management and is dismayed that her appointment at the nutrition center is 2 weeks away.  Pt will call to see if she can get a sooner appt. Encouraged her to keep a food diary so she can have information for the dietitian at her visit.. Measurements taken with little difference at lower arm, but some decrase in upper arm.  Pt states she liiks her arm to be wrapped.   Clinical Impairments Affecting Rehab Potential multiple medical and chronic pain issues   PT Next Visit Plan continue with complete decongestive therapy with encouragement toward more independent management        Problem List Patient Active Problem List   Diagnosis Date Noted  . Diabetes mellitus type II, non insulin dependent 09/19/2014  . Breast cancer, right breast 05/24/2014  . Myalgia and myositis 05/17/2014  . Neoplasm related pain 03/12/2014  . CN (constipation) 11/26/2013  . H/O reduction mammoplasty 10/30/2013  . Seizure 10/06/2013  . Status post bilateral breast implants 08/31/2013  . Acquired absence of breast and absent nipple 08/23/2013  . S/P breast reconstruction, right 08/23/2013  . AC  joint arthropathy 07/02/2013  . Routine adult health maintenance 05/29/2013  . Hypokalemia 05/21/2013  . Chronic pain 04/19/2013  . Elevated blood sugar 03/09/2013  . Wheezing 02/22/2013  . Acute bronchitis 02/22/2013  . Lymphedema 05/24/2011  . RHINOSINUSITIS, CHRONIC 04/08/2010  . Obesity 08/04/2006  . DEPRESSIVE DISORDER, NOS 08/04/2006  . RHINITIS, ALLERGIC 08/04/2006   Donato Heinz. Owens Shark, PT  09/20/2014, 12:36 PM  [place], [place], [address] Phone: [phone]   Fax:  [phone]

## 2014-09-24 ENCOUNTER — Ambulatory Visit: Payer: Medicare Other

## 2014-09-24 ENCOUNTER — Encounter: Payer: Medicare Other | Admitting: Physical Therapy

## 2014-09-24 ENCOUNTER — Ambulatory Visit (HOSPITAL_BASED_OUTPATIENT_CLINIC_OR_DEPARTMENT_OTHER): Payer: Medicare Other

## 2014-09-24 VITALS — BP 127/75 | HR 97 | Temp 98.8°F

## 2014-09-24 DIAGNOSIS — C50911 Malignant neoplasm of unspecified site of right female breast: Secondary | ICD-10-CM | POA: Diagnosis not present

## 2014-09-24 DIAGNOSIS — Z5111 Encounter for antineoplastic chemotherapy: Secondary | ICD-10-CM | POA: Diagnosis not present

## 2014-09-24 MED ORDER — GOSERELIN ACETATE 3.6 MG ~~LOC~~ IMPL
3.6000 mg | DRUG_IMPLANT | Freq: Once | SUBCUTANEOUS | Status: AC
  Administered 2014-09-24: 3.6 mg via SUBCUTANEOUS
  Filled 2014-09-24: qty 3.6

## 2014-09-25 ENCOUNTER — Ambulatory Visit: Payer: Medicare Other

## 2014-09-25 DIAGNOSIS — R293 Abnormal posture: Secondary | ICD-10-CM

## 2014-09-25 DIAGNOSIS — M62838 Other muscle spasm: Secondary | ICD-10-CM

## 2014-09-25 DIAGNOSIS — I890 Lymphedema, not elsewhere classified: Principal | ICD-10-CM

## 2014-09-25 DIAGNOSIS — M25611 Stiffness of right shoulder, not elsewhere classified: Secondary | ICD-10-CM

## 2014-09-25 DIAGNOSIS — M7918 Myalgia, other site: Secondary | ICD-10-CM

## 2014-09-25 DIAGNOSIS — M6281 Muscle weakness (generalized): Secondary | ICD-10-CM

## 2014-09-25 DIAGNOSIS — E8989 Other postprocedural endocrine and metabolic complications and disorders: Secondary | ICD-10-CM

## 2014-09-25 DIAGNOSIS — M5489 Other dorsalgia: Secondary | ICD-10-CM

## 2014-09-25 NOTE — Progress Notes (Signed)
Urine drug screen for this encounter is consistent for prescribed medication 

## 2014-09-25 NOTE — Therapy (Signed)
[place], [place], [address] Phone: [phone]   Fax:  [phone]  Physical Therapy Treatment  Patient Details  Name: Karen Hopkins MRN: [id number] Date of Birth: [birth year] Referring Provider:  Chauncey Cruel, MD  Encounter Date: 09/25/2014      PT End of Session - 09/25/14 0952    Visit Number 17   Number of Visits 28   Date for PT Re-Evaluation 10/22/14   PT Start Time 0944   PT Stop Time 1017   PT Time Calculation (min) 33 min      Past Medical History  Diagnosis Date  . Seasonal allergies   . Lymphedema of arm     right; no BP or puncture to right arm  . Depression   . Anxiety   . Sinus headache   . Hypertension     under control with med., has been on med. x 2 yr.  . History of breast cancer 2011    right  . History of chemotherapy 2011  . History of radiation therapy 2011    Past Surgical History  Procedure Laterality Date  . Supra-umbilical hernia  5631  . Nasal septum surgery    . Latissimus flap to breast Right 03/12/2013    Procedure: RIGHT BREAST LATISSIMUS FLAP WITH EXPANDER PLACEMENT;  Surgeon: Theodoro Kos, DO;  Location: [facility];  Service: Plastics;  Laterality: Right;  . Cesarean section  07/15/2001; 03/18/2004; 11/21/2008  . Tubal ligation  11/21/2008  . Portacath placement Left 09/12/2009  . Modified radical mastectomy Right 03/11/2010  . Port-a-cath removal Left 12/01/2010  . Removal of tissue expander and placement of implant Right 08/23/2013    Procedure: REMOVAL RIGHT TISSUE EXPANDER AND PLACEMENT OF IMPLANT TO RIGHT BREAST ;  Surgeon: Theodoro Kos, DO;  Location: [facility];  Service: Plastics;  Laterality: Right;  . Breast reduction surgery Left 08/23/2013    Procedure: LEFT BREAST REDUCTION  ;  Surgeon: Theodoro Kos, DO;  Location: [facility];  Service: Plastics;  Laterality: Left;  . Liposuction Bilateral 08/23/2013    Procedure:  LIPOSUCTION;  Surgeon: Theodoro Kos, DO;  Location: [facility];  Service: Plastics;  Laterality: Bilateral;    There were no vitals filed for this visit.  Visit Diagnosis:  Lymphedema of upper extremity following lymphadenectomy  Myofascial muscle pain  Shoulder joint stiffness, right  Muscle spasms of neck  Muscle weakness of right upper extremity  Stiffness of joint, shoulder region, right  Right-sided back pain, unspecified location  Abnormal posture      Subjective Assessment - 09/25/14 0946    Subjective Got the results of my blood work, showed that I dont have a thyroid problem, but I am borderline diabetes with a level of 6.8.    Currently in Pain? No/denies                         [facility] Adult PT Treatment/Exercise - 09/25/14 0001    Manual Therapy   Manual Lymphatic Drainage (MLD) In Supine: Short neck, Lt axilla and Rt inguinal nodes, anterior inter-axillary and Rt axillo-inguinal anastomosis and Rt UE from dorsal hand to lateral shoulder.    Compression Bandaging Biotone lotion applied, then bandaging with stockinette, Elastomull on all fingers one -four , Artiflex x 2, and 1-6 cm., and 3-12 cm. bandages from right hand to axilla.  PT Short Term Goals - 09/20/14 1122    PT SHORT TERM GOAL #1   Title Patient will report a 25% reduction in pain with usual daily activities and home chores.   Status Achieved   PT SHORT TERM GOAL #2   Title Patient will be able to resume a basic home ex program with minimal increase in pain    Status Achieved   PT SHORT TERM GOAL #3   Title Right shoulder flexion improved to 92 degrees needed for reaching shoulder height shelves.   Status Achieved   PT SHORT TERM GOAL #4   Title Quick DASH score improved from 70.5 "severe difficulty" with ADLs level to 60 indicating improved function with less pain with grooming, household chores and sleep.     Status Achieved          Short Term Clinic Goals - 09/03/14 1217    CC Short Term Goal  #1   Title short term goals= long term goals          PT Long Term Goals - 09/20/14 1123    PT LONG TERM GOAL #1   Title Independent with progressive HEP for further ROM, strengthening of Right UE.   Time 14   Period Weeks   Status On-going   PT LONG TERM GOAL #2   Title Right shoulder flexion and abduction AROM improved to 105 degrees needed for reaching eye level shelves in the kitchen.     Time 14   Period Weeks   Status On-going   PT LONG TERM GOAL #3   Title Right UE strength grossly 4-/5 needed for basic cooking/cleaning for her 3 children.     Time 14   Period Weeks   Status On-going   PT LONG TERM GOAL #4   Title Quick DASH score improved to 50 indicating "moderate difficulty" with basic ADLS rather than "severe difficulty'"   Time 14   Period Weeks   Status On-going   PT LONG TERM GOAL #5   Title Patient will report overall improvement in pain level 3/10 level or better  and improve  function at 50%.   Time 14   Period Weeks   PT LONG TERM GOAL #6   Title Pt will be able to manage pain at night in order to sleep 4 or more hours of restorative sleep. Pt will verbalize 3 pain management strategies for sleep/rest   Time 14   Period Weeks   Status On-going           Long Term Clinic Goals - 09/20/14 1234    CC Long Term Goal  #4   Title right arm circumference at 15 cm provximal to olecranon will be reduced by 3 sm   Status Partially Met            Plan - 09/25/14 1018    Clinical Impression Statement Pt arrived for appt late today. She has an appt with nutrionist on May 2 and is eager to find ways to feel better. Continues to tolerate bandaging well with no skin irritations.   Pt will benefit from skilled therapeutic intervention in order to improve on the following deficits Impaired UE functional use;Increased fascial restricitons;Increased edema;Impaired flexibility;Postural  dysfunction;Obesity;Decreased scar mobility;Decreased range of motion   Rehab Potential Good   Clinical Impairments Affecting Rehab Potential multiple medical and chronic pain issues   PT Frequency 2x / week   PT Duration 6 weeks   PT Treatment/Interventions ADLs/Self Care Home Management;Dry  needling;Manual techniques;Scar mobilization;Therapeutic exercise;Therapeutic activities;Patient/family education   PT Next Visit Plan Remeasure next visit; continue with complete decongestive therapy with encouragement toward more independent management   Consulted and Agree with Plan of Care Patient        Problem List Patient Active Problem List   Diagnosis Date Noted  . Diabetes mellitus type II, non insulin dependent 09/19/2014  . Breast cancer, right breast 05/24/2014  . Myalgia and myositis 05/17/2014  . Neoplasm related pain 03/12/2014  . CN (constipation) 11/26/2013  . H/O reduction mammoplasty 10/30/2013  . Seizure 10/06/2013  . Status post bilateral breast implants 08/31/2013  . Acquired absence of breast and absent nipple 08/23/2013  . S/P breast reconstruction, right 08/23/2013  . AC joint arthropathy 07/02/2013  . Routine adult health maintenance 05/29/2013  . Hypokalemia 05/21/2013  . Chronic pain 04/19/2013  . Elevated blood sugar 03/09/2013  . Wheezing 02/22/2013  . Acute bronchitis 02/22/2013  . Lymphedema 05/24/2011  . RHINOSINUSITIS, CHRONIC 04/08/2010  . Obesity 08/04/2006  . DEPRESSIVE DISORDER, NOS 08/04/2006  . RHINITIS, ALLERGIC 08/04/2006    Otelia Limes, PTA 09/25/2014, 10:20 AM  [place], [place], [address] Phone: [phone]   Fax:  [phone]

## 2014-09-26 ENCOUNTER — Ambulatory Visit: Payer: Medicare Other | Admitting: Physical Therapy

## 2014-09-26 ENCOUNTER — Other Ambulatory Visit: Payer: Self-pay | Admitting: *Deleted

## 2014-09-26 DIAGNOSIS — I890 Lymphedema, not elsewhere classified: Principal | ICD-10-CM

## 2014-09-26 DIAGNOSIS — E8989 Other postprocedural endocrine and metabolic complications and disorders: Secondary | ICD-10-CM | POA: Diagnosis not present

## 2014-09-26 DIAGNOSIS — M25611 Stiffness of right shoulder, not elsewhere classified: Secondary | ICD-10-CM

## 2014-09-26 DIAGNOSIS — M62838 Other muscle spasm: Secondary | ICD-10-CM

## 2014-09-26 DIAGNOSIS — M6281 Muscle weakness (generalized): Secondary | ICD-10-CM

## 2014-09-26 DIAGNOSIS — M7918 Myalgia, other site: Secondary | ICD-10-CM

## 2014-09-26 DIAGNOSIS — M5489 Other dorsalgia: Secondary | ICD-10-CM

## 2014-09-26 DIAGNOSIS — R293 Abnormal posture: Secondary | ICD-10-CM

## 2014-09-26 MED ORDER — OXYCODONE HCL 5 MG PO TABS
5.0000 mg | ORAL_TABLET | Freq: Four times a day (QID) | ORAL | Status: DC | PRN
Start: 2014-09-26 — End: 2014-10-08

## 2014-09-26 NOTE — Patient Instructions (Signed)
Flexion (Eccentric) - Active-Assist (Cane)   Use unaffected arm to push affected arm forward. Avoid hiking shoulder. Keep palm relaxed. Slowly lower affected arm for 3-5 seconds, increasing use of affected arm. _10__ reps per set, _1-2__ sets per day, __7_ days per week.  Copyright  VHI. All rights reserved  Cane Exercise: Abduction   Hold cane with right hand over end, palm-up, with other hand palm-down. Move arm out from side and up by pushing with other arm. Hold _3-5___ seconds. Repeat __10 x2__ times. Do __1-2__ sessions per day.  [email or web address].exer.[email or web address]   Copyright  VHI. All rights reserved.  Voncille Lo, PT 09/26/2014 11:15 AM Phone: [phone] Fax: [phone]

## 2014-09-26 NOTE — Telephone Encounter (Signed)
Recd fax from [facility] stating that patient only recd 73 tablets of her oxycodone last time because they were out of stock.  Will print out RX for 47 tablets - the difference 120-73=47  For patient to pick up. Called patient and told her that the RX is ready for her to pick up.

## 2014-09-26 NOTE — Therapy (Signed)
[place], [place], [address] Phone: [phone]   Fax:  [phone]  Physical Therapy Treatment  Patient Details  Name: Karen Hopkins MRN: [id number] Date of Birth: [birth year] Referring Provider:  [name], MD  Encounter Date: 09/26/2014      PT End of Session - 09/26/14 1158    Visit Number 18   Date for PT Re-Evaluation 10/22/14   PT Start Time 1100   PT Stop Time 1202   PT Time Calculation (min) 62 min   Activity Tolerance Patient tolerated treatment well;Patient limited by pain  and weakness   Behavior During Therapy [facility] for tasks assessed/performed      Past Medical History  Diagnosis Date  . Seasonal allergies   . Lymphedema of arm     right; no BP or puncture to right arm  . Depression   . Anxiety   . Sinus headache   . Hypertension     under control with med., has been on med. x 2 yr.  . History of breast cancer 2011    right  . History of chemotherapy 2011  . History of radiation therapy 2011    Past Surgical History  Procedure Laterality Date  . Supra-umbilical hernia  2119  . Nasal septum surgery    . Latissimus flap to breast Right 03/12/2013    Procedure: RIGHT BREAST LATISSIMUS FLAP WITH EXPANDER PLACEMENT;  Surgeon: Theodoro Kos, DO;  Location: [place];  Service: Plastics;  Laterality: Right;  . Cesarean section  07/15/2001; 03/18/2004; 11/21/2008  . Tubal ligation  11/21/2008  . Portacath placement Left 09/12/2009  . Modified radical mastectomy Right 03/11/2010  . Port-a-cath removal Left 12/01/2010  . Removal of tissue expander and placement of implant Right 08/23/2013    Procedure: REMOVAL RIGHT TISSUE EXPANDER AND PLACEMENT OF IMPLANT TO RIGHT BREAST ;  Surgeon: Theodoro Kos, DO;  Location: [place];  Service: Plastics;  Laterality: Right;  . Breast reduction surgery Left 08/23/2013    Procedure: LEFT BREAST REDUCTION  ;  Surgeon: Theodoro Kos, DO;   Location: [place];  Service: Plastics;  Laterality: Left;  . Liposuction Bilateral 08/23/2013    Procedure: LIPOSUCTION;  Surgeon: Theodoro Kos, DO;  Location: [place];  Service: Plastics;  Laterality: Bilateral;    There were no vitals filed for this visit.  Visit Diagnosis:  Myofascial muscle pain  Shoulder joint stiffness, right  Muscle spasms of neck  Muscle weakness of right upper extremity  Stiffness of joint, shoulder region, right  Right-sided back pain, unspecified location  Abnormal posture      Subjective Assessment - 09/26/14 1105    Subjective I had to miss a day because I got off diet and ate [facility] pizza and got sick.  I can't handle greasy food.     Pertinent History Right breast cancer with mastectomy 2011; reconstruction; left breast reduction; h/o right UE lymphedema and right upper quadrant pain, treated in this clinic several times before.  Recent scare with left breeast, but but ultrasound and mammogram were negative.   How long can you stand comfortably? 15 min  swollen feet   Currently in Pain? Yes   Pain Score 5    Pain Location Shoulder   Pain Orientation Right   Pain Descriptors / Indicators Aching   Pain Location Neck  scalenes , SCM   Pain Orientation Right   Pain Descriptors / Indicators Aching;Sharp;Sore  Pain Type Chronic pain            [facility] PT Assessment - 09/26/14 0001    AROM   Right Shoulder Flexion 114 Degrees  sore   Right Shoulder ABduction 93 Degrees  pain in Right side back with abdand very sore   Right Shoulder Internal Rotation --  unable with lymphedema wrap   Right Shoulder External Rotation --  with lymphedema wrapy   Cervical Flexion 30  after treatment for all cervical motions   Cervical Extension 50   Cervical - Right Side Bend 40   Cervical - Left Side Bend 40   Cervical - Right Rotation 43   Cervical - Left Rotation 55   Strength   Right Shoulder Flexion 3/5   Right  Shoulder Extension 3/5   Right Shoulder ABduction 3/5   Right Shoulder Internal Rotation 3/5   Right Shoulder External Rotation 3/5           LYMPHEDEMA/ONCOLOGY QUESTIONNAIRE - 09/26/14 1023    Right Upper Extremity Lymphedema   At Axilla  43.7 cm   15 cm Proximal to Olecranon Process 49.5 cm   10 cm Proximal to Olecranon Process 47.5 cm   Olecranon Process 32 cm   15 cm Proximal to Ulnar Styloid Process 32.6 cm   10 cm Proximal to Ulnar Styloid Process 29 cm   Just Proximal to Ulnar Styloid Process 18.5 cm   Across Hand at [employer] 21.5 cm   At [place] of 2nd Digit 6 cm       from previous lymphedema appt on same day not this treatment session           [place] Adult PT Treatment/Exercise - 09/26/14 1151    Shoulder Exercises: Standing   Other Standing Exercises standing cane for Right flexion and abduction with VC and TC 2 sets for 5 each   Moist Heat Therapy   Number Minutes Moist Heat 15 Minutes   Moist Heat Location --  cervical only   Manual Therapy   Joint Mobilization C-3 to c-6 PA mob lateral form right, Grade 3, also grade 3 PA thoracic mobs T-3 t T-8, r 1st rib mob Grade 3    Myofascial Release upper traps, levator and scalene, sup clavicular border on right only          Trigger Point Dry Needling - 09/26/14 1211    Consent Given? Yes   Muscles Treated Upper Body Upper trapezius;Levator scapulae  SCM.Scalenes Right strong twitch response and mx lengthening   Upper Trapezius Response Twitch reponse elicited;Palpable increased muscle length   Oblique Capitus Response Twitch response elicited;Palpable increased muscle length   Levator Scapulae Response Twitch response elicited;Palpable increased muscle length              PT Education - 09/26/14 1152    Education provided Yes   Education Details Pt educated on flexion/abd with standing cane and given handout.  Pt educated on painful R scalene attachement to clavical/1 st rib pain    [place](s) Educated Patient   Methods Explanation;Handout;Demonstration;Tactile cues;Verbal cues   Comprehension Verbalized understanding          PT Short Term Goals - 09/26/14 1210    PT SHORT TERM GOAL #1   Title Patient will report a 25% reduction in pain with usual daily activities and home chores.   Period Weeks   Status Achieved   PT SHORT TERM GOAL #2   Title Patient will be able  to resume a basic home ex program with minimal increase in pain    Time 4   Period Weeks   Status Achieved   PT SHORT TERM GOAL #3   Title Right shoulder flexion improved to 92 degrees needed for reaching shoulder height shelves.   Time 4   Period Weeks   Status Achieved   PT SHORT TERM GOAL #4   Title Quick DASH score improved from 70.5 "severe difficulty" with ADLs level to 60 indicating improved function with less pain with grooming, household chores and sleep.     Time 4   Status Achieved         Short Term Clinic Goals - 09/03/14 1217    CC Short Term Goal  #1   Title short term goals= long term goals          PT Long Term Goals - 09/26/14 1210    PT LONG TERM GOAL #1   Title Independent with progressive HEP for further ROM, strengthening of Right UE.   Time 14   Period Weeks   Status On-going   PT LONG TERM GOAL #2   Title Right shoulder flexion and abduction AROM improved to 105 degrees needed for reaching eye level shelves in the kitchen.    flex 114, abde 95   Time 14   Period Weeks   Status On-going   PT LONG TERM GOAL #3   Title Right UE strength grossly 4-/5 needed for basic cooking/cleaning for her 3 children.    3/5   Time 14   Period Weeks   PT LONG TERM GOAL #4   Title Quick DASH score improved to 50 indicating "moderate difficulty" with basic ADLS rather than "severe difficulty'"   Time 14   Period Weeks   Status On-going   PT LONG TERM GOAL #5   Title Patient will report overall improvement in pain level 3/10 level or better  and improve  function at 50%.    Time 14   Period Weeks   Status On-going   PT LONG TERM GOAL #6   Title Pt will be able to manage pain at night in order to sleep 4 or more hours of restorative sleep. Pt will verbalize 3 pain management strategies for sleep/rest   Time 14   Period Weeks   Status On-going           Long Term Clinic Goals - 09/20/14 1234    CC Long Term Goal  #4   Title right arm circumference at 15 cm provximal to olecranon will be reduced by 3 sm   Status Partially Met            Plan - 09/26/14 1159    Clinical Impression Statement Pt with imporved upper trap and levator , Right scalene muscle length following treatment session. Pt with marked trigger points on right ant/medial scalene and Right SCM,  Pt with marked superior clavicular attachment pain.  Increaseed AROM of cervical ROM. for side bend and rotatiaon    Pt will benefit from skilled therapeutic intervention in order to improve on the following deficits Impaired UE functional use;Increased fascial restricitons;Increased edema;Impaired flexibility;Postural dysfunction;Obesity;Decreased scar mobility;Decreased range of motion   Rehab Potential Good   PT Frequency 2x / week   PT Duration 6 weeks   PT Treatment/Interventions ADLs/Self Care Home Management;Dry needling;Manual techniques;Scar mobilization;Therapeutic exercise;Therapeutic activities;Patient/family education   PT Next Visit Plan Concentrate on dry needling for SCM and Myofascial of SCM of Right.  possible mobilzation of Right clavicle next visit   PT Home Exercise Plan Gave standing cane flex and abd exercise   Consulted and Agree with Plan of Care Patient        Problem List Patient Active Problem List   Diagnosis Date Noted  . Diabetes mellitus type II, non insulin dependent 09/19/2014  . Breast cancer, right breast 05/24/2014  . Myalgia and myositis 05/17/2014  . Neoplasm related pain 03/12/2014  . CN (constipation) 11/26/2013  . H/O reduction mammoplasty  10/30/2013  . Seizure 10/06/2013  . Status post bilateral breast implants 08/31/2013  . Acquired absence of breast and absent nipple 08/23/2013  . S/P breast reconstruction, right 08/23/2013  . AC joint arthropathy 07/02/2013  . Routine adult health maintenance 05/29/2013  . Hypokalemia 05/21/2013  . Chronic pain 04/19/2013  . Elevated blood sugar 03/09/2013  . Wheezing 02/22/2013  . Acute bronchitis 02/22/2013  . Lymphedema 05/24/2011  . RHINOSINUSITIS, CHRONIC 04/08/2010  . Obesity 08/04/2006  . DEPRESSIVE DISORDER, NOS 08/04/2006  . RHINITIS, ALLERGIC 08/04/2006    Voncille Lo, PT 09/26/2014 12:17 PM Phone: [phone] Fax: [place], [place], [address] Phone: [phone]   Fax:  [phone]

## 2014-09-26 NOTE — Therapy (Signed)
[place], [place], [address] Phone: [phone]   Fax:  [phone]  Physical Therapy Treatment  Patient Details  Name: Karen Hopkins MRN: [id number] Date of Birth: 02-03-1976 Referring Provider:  [name], MD  Encounter Date: 09/26/2014      PT End of Session - 09/26/14 1250    Visit Number 19   Number of Visits 28   Date for PT Re-Evaluation 10/22/14   PT Start Time 1017   PT Stop Time 1100   PT Time Calculation (min) 43 min      Past Medical History  Diagnosis Date  . Seasonal allergies   . Lymphedema of arm     right; no BP or puncture to right arm  . Depression   . Anxiety   . Sinus headache   . Hypertension     under control with med., has been on med. x 2 yr.  . History of breast cancer 2011    right  . History of chemotherapy 2011  . History of radiation therapy 2011    Past Surgical History  Procedure Laterality Date  . Supra-umbilical hernia  9767  . Nasal septum surgery    . Latissimus flap to breast Right 03/12/2013    Procedure: RIGHT BREAST LATISSIMUS FLAP WITH EXPANDER PLACEMENT;  Surgeon: Theodoro Kos, DO;  Location: [place];  Service: Plastics;  Laterality: Right;  . Cesarean section  07/15/2001; 03/18/2004; 11/21/2008  . Tubal ligation  11/21/2008  . Portacath placement Left 09/12/2009  . Modified radical mastectomy Right 03/11/2010  . Port-a-cath removal Left 12/01/2010  . Removal of tissue expander and placement of implant Right 08/23/2013    Procedure: REMOVAL RIGHT TISSUE EXPANDER AND PLACEMENT OF IMPLANT TO RIGHT BREAST ;  Surgeon: Theodoro Kos, DO;  Location: [place];  Service: Plastics;  Laterality: Right;  . Breast reduction surgery Left 08/23/2013    Procedure: LEFT BREAST REDUCTION  ;  Surgeon: Theodoro Kos, DO;  Location: [place];  Service: Plastics;  Laterality: Left;  . Liposuction Bilateral 08/23/2013     Procedure: LIPOSUCTION;  Surgeon: Theodoro Kos, DO;  Location: [place];  Service: Plastics;  Laterality: Bilateral;    There were no vitals filed for this visit.  Visit Diagnosis:  Lymphedema of upper extremity following lymphadenectomy      Subjective Assessment - 09/26/14 1249    Subjective I just took them off this morning.  pt feels that her arm is down some    Pain Score 5    Pain Location Shoulder   Pain Orientation Right   Pain Descriptors / Indicators Aching   Pain Type Chronic pain   Pain Onset More than a month ago   Pain Frequency Constant   Aggravating Factors  using her arm   Pain Relieving Factors dry needling, soft tissue work            [facility] PT Assessment - 09/26/14 0001    AROM   Right Shoulder Flexion 114 Degrees  sore   Right Shoulder ABduction 93 Degrees  pain in Right side back with abdand very sore   Right Shoulder Internal Rotation --  unable with lymphedema wrap   Right Shoulder External Rotation --  with lymphedema wrapy   Cervical Flexion 30  after treatment for all cervical motions   Cervical Extension 50   Cervical - Right Side Bend 40   Cervical - Left Side Bend 40  Cervical - Right Rotation 43   Cervical - Left Rotation 55   Strength   Right Shoulder Flexion 3/5   Right Shoulder Extension 3/5   Right Shoulder ABduction 3/5   Right Shoulder Internal Rotation 3/5   Right Shoulder External Rotation 3/5           LYMPHEDEMA/ONCOLOGY QUESTIONNAIRE - 09/26/14 1023    Right Upper Extremity Lymphedema   At Axilla  43.7 cm   15 cm Proximal to Olecranon Process 49.5 cm   10 cm Proximal to Olecranon Process 47.5 cm   Olecranon Process 32 cm   15 cm Proximal to Ulnar Styloid Process 32.6 cm   10 cm Proximal to Ulnar Styloid Process 29 cm   Just Proximal to Ulnar Styloid Process 18.5 cm   Across Hand at [employer] 21.5 cm   At [place] of 2nd Digit 6 cm                  [facility] Adult PT  Treatment/Exercise - 09/26/14 1250    Neck Exercises: Seated   Other Seated Exercise neck and upper thoracic ROM prior to manual lymph drainage   Manual Therapy   Manual Lymphatic Drainage (MLD) In Supine: Short neck, Lt axilla and Rt inguinal nodes, anterior inter-axillary and Rt axillo-inguinal anastomosis and Rt UE from dorsal hand to lateral shoulder.    Compression Bandaging Biotone lotion applied, then bandaging with stockinette, Elastomull on all fingers one -four , Artiflex x 2, and 1-6 cm., and 3-12 cm. bandages from right hand to axilla.          Trigger Point Dry Needling - 09/26/14 1211    Consent Given? Yes   Muscles Treated Upper Body Upper trapezius;Levator scapulae  SCM.Scalenes Right strong twitch response and mx lengthening   Upper Trapezius Response Twitch reponse elicited;Palpable increased muscle length   Oblique Capitus Response Twitch response elicited;Palpable increased muscle length   Levator Scapulae Response Twitch response elicited;Palpable increased muscle length              PT Education - 09/26/14 1152    Education provided Yes   Education Details Pt educated on flexion/abd with standing cane and given handout.  Pt educated on painful R scalene attachement to clavical/1 st rib pain   [place](s) Educated Patient   Methods Explanation;Handout;Demonstration;Tactile cues;Verbal cues   Comprehension Verbalized understanding          PT Short Term Goals - 09/26/14 1210    PT SHORT TERM GOAL #1   Title Patient will report a 25% reduction in pain with usual daily activities and home chores.   Period Weeks   Status Achieved   PT SHORT TERM GOAL #2   Title Patient will be able to resume a basic home ex program with minimal increase in pain    Time 4   Period Weeks   Status Achieved   PT SHORT TERM GOAL #3   Title Right shoulder flexion improved to 92 degrees needed for reaching shoulder height shelves.   Time 4   Period Weeks   Status Achieved    PT SHORT TERM GOAL #4   Title Quick DASH score improved from 70.5 "severe difficulty" with ADLs level to 60 indicating improved function with less pain with grooming, household chores and sleep.     Time 4   Status Achieved         Short Term Clinic Goals - 09/03/14 1217    CC Short Term Goal  #  1   Title short term goals= long term goals          PT Long Term Goals - 09/26/14 1210    PT LONG TERM GOAL #1   Title Independent with progressive HEP for further ROM, strengthening of Right UE.   Time 14   Period Weeks   Status On-going   PT LONG TERM GOAL #2   Title Right shoulder flexion and abduction AROM improved to 105 degrees needed for reaching eye level shelves in the kitchen.    flex 114, abde 95   Time 14   Period Weeks   Status On-going   PT LONG TERM GOAL #3   Title Right UE strength grossly 4-/5 needed for basic cooking/cleaning for her 3 children.    3/5   Time 14   Period Weeks   PT LONG TERM GOAL #4   Title Quick DASH score improved to 50 indicating "moderate difficulty" with basic ADLS rather than "severe difficulty'"   Time 14   Period Weeks   Status On-going   PT LONG TERM GOAL #5   Title Patient will report overall improvement in pain level 3/10 level or better  and improve  function at 50%.   Time 14   Period Weeks   Status On-going   PT LONG TERM GOAL #6   Title Pt will be able to manage pain at night in order to sleep 4 or more hours of restorative sleep. Pt will verbalize 3 pain management strategies for sleep/rest   Time 14   Period Weeks   Status On-going           Long Term Clinic Goals - 09/26/14 1253    CC Long Term Goal  #4   Title right arm circumference at 15 cm provximal to olecranon will be reduced by 3 sm   Status Partially Met            Plan - 09/26/14 1251    Clinical Impression Statement Improved circumference measures today.  Pt encouraged to put compression sleeve on as soon as she can after removal of bandages.   Should be ready to d./c from lymphedema treatement soon.   PT Next Visit Plan remeasure arm, ask patient to bring in compressin sleeve and continue preparation for disharge  Gcode next        Problem List Patient Active Problem List   Diagnosis Date Noted  . Diabetes mellitus type II, non insulin dependent 09/19/2014  . Breast cancer, right breast 05/24/2014  . Myalgia and myositis 05/17/2014  . Neoplasm related pain 03/12/2014  . CN (constipation) 11/26/2013  . H/O reduction mammoplasty 10/30/2013  . Seizure 10/06/2013  . Status post bilateral breast implants 08/31/2013  . Acquired absence of breast and absent nipple 08/23/2013  . S/P breast reconstruction, right 08/23/2013  . AC joint arthropathy 07/02/2013  . Routine adult health maintenance 05/29/2013  . Hypokalemia 05/21/2013  . Chronic pain 04/19/2013  . Elevated blood sugar 03/09/2013  . Wheezing 02/22/2013  . Acute bronchitis 02/22/2013  . Lymphedema 05/24/2011  . RHINOSINUSITIS, CHRONIC 04/08/2010  . Obesity 08/04/2006  . DEPRESSIVE DISORDER, NOS 08/04/2006  . RHINITIS, ALLERGIC 08/04/2006   Donato Heinz. Owens Shark, PT  09/26/2014, 12:56 PM  [place], [place], [address] Phone: [phone]   Fax:  [phone]

## 2014-10-01 ENCOUNTER — Ambulatory Visit (INDEPENDENT_AMBULATORY_CARE_PROVIDER_SITE_OTHER): Payer: Medicare Other | Admitting: Family Medicine

## 2014-10-01 ENCOUNTER — Ambulatory Visit: Payer: Medicare Other

## 2014-10-01 ENCOUNTER — Other Ambulatory Visit: Payer: Self-pay | Admitting: Physical Medicine & Rehabilitation

## 2014-10-01 ENCOUNTER — Encounter: Payer: Self-pay | Admitting: Family Medicine

## 2014-10-01 ENCOUNTER — Ambulatory Visit: Payer: Medicare Other | Admitting: Physical Therapy

## 2014-10-01 VITALS — BP 120/79 | HR 101 | Temp 99.3°F | Ht 64.0 in | Wt 282.8 lb

## 2014-10-01 DIAGNOSIS — M25611 Stiffness of right shoulder, not elsewhere classified: Secondary | ICD-10-CM

## 2014-10-01 DIAGNOSIS — E119 Type 2 diabetes mellitus without complications: Secondary | ICD-10-CM | POA: Diagnosis not present

## 2014-10-01 DIAGNOSIS — M7918 Myalgia, other site: Secondary | ICD-10-CM

## 2014-10-01 DIAGNOSIS — M6281 Muscle weakness (generalized): Secondary | ICD-10-CM

## 2014-10-01 DIAGNOSIS — M62838 Other muscle spasm: Secondary | ICD-10-CM

## 2014-10-01 DIAGNOSIS — R600 Localized edema: Secondary | ICD-10-CM | POA: Diagnosis not present

## 2014-10-01 DIAGNOSIS — M5489 Other dorsalgia: Secondary | ICD-10-CM

## 2014-10-01 DIAGNOSIS — R0601 Orthopnea: Secondary | ICD-10-CM | POA: Diagnosis not present

## 2014-10-01 DIAGNOSIS — R293 Abnormal posture: Secondary | ICD-10-CM

## 2014-10-01 DIAGNOSIS — I890 Lymphedema, not elsewhere classified: Principal | ICD-10-CM

## 2014-10-01 DIAGNOSIS — E8989 Other postprocedural endocrine and metabolic complications and disorders: Secondary | ICD-10-CM

## 2014-10-01 MED ORDER — METFORMIN HCL 500 MG PO TABS: 500.0000 mg | ORAL_TABLET | Freq: Two times a day (BID) | ORAL | Status: DC

## 2014-10-01 NOTE — Assessment & Plan Note (Addendum)
Relatively new in onset, for about 5 weeks now. Has responded to lasix 20mg  daily (although delayed response as she reports it just started getting better in last several days). She is also endorsing some orthopnea-type symptoms. No hypoxia with ambulation here in clinic. No dyspnea on exertion or anginal-type chest pain. She has risk factors for CHF, including prior chemotherapy with doxorubicin. EKG today normal without any signs of ischemia. Plan: -Obtain 2-D echo to evaluate cardiac function -Continue Lasix 20 mg daily -Return for fasting lipids and BMET to ensure renal function stable while on Lasix (wanted to get BMET today, however lab already closed) -Follow up with me in one month.

## 2014-10-01 NOTE — Assessment & Plan Note (Signed)
Newly diagnosed with recent A1c of 6.8. Will start metformin 500mg  daily, increase to 500mg  BID if tolerated after 2 weeks. Instructed pt to get diabetic eye exam She already has appt set up with nutritionist, and will tailor this nutrition visit to diabetes Follow up with me in 1 month to see how things are going.

## 2014-10-01 NOTE — Therapy (Signed)
[place], [place], [address] Phone: [phone]   Fax:  [phone]  Physical Therapy Treatment  Patient Details  Name: Karen Hopkins MRN: [id number] Date of Birth: 1975-12-19 Referring Provider:  [name], MD  Encounter Date: 10/01/2014      PT End of Session - 10/01/14 1201    Visit Number 20   Number of Visits 28   Date for PT Re-Evaluation 10/22/14   PT Start Time 1110   PT Stop Time 1157   PT Time Calculation (min) 47 min      Past Medical History  Diagnosis Date  . Seasonal allergies   . Lymphedema of arm     right; no BP or puncture to right arm  . Depression   . Anxiety   . Sinus headache   . Hypertension     under control with med., has been on med. x 2 yr.  . History of breast cancer 2011    right  . History of chemotherapy 2011  . History of radiation therapy 2011    Past Surgical History  Procedure Laterality Date  . Supra-umbilical hernia  6629  . Nasal septum surgery    . Latissimus flap to breast Right 03/12/2013    Procedure: RIGHT BREAST LATISSIMUS FLAP WITH EXPANDER PLACEMENT;  Surgeon: Theodoro Kos, DO;  Location: [facility];  Service: Plastics;  Laterality: Right;  . Cesarean section  07/15/2001; 03/18/2004; 11/21/2008  . Tubal ligation  11/21/2008  . Portacath placement Left 09/12/2009  . Modified radical mastectomy Right 03/11/2010  . Port-a-cath removal Left 12/01/2010  . Removal of tissue expander and placement of implant Right 08/23/2013    Procedure: REMOVAL RIGHT TISSUE EXPANDER AND PLACEMENT OF IMPLANT TO RIGHT BREAST ;  Surgeon: Theodoro Kos, DO;  Location: [facility];  Service: Plastics;  Laterality: Right;  . Breast reduction surgery Left 08/23/2013    Procedure: LEFT BREAST REDUCTION  ;  Surgeon: Theodoro Kos, DO;  Location: [facility];  Service: Plastics;  Laterality: Left;  . Liposuction Bilateral 08/23/2013   Procedure: LIPOSUCTION;  Surgeon: Theodoro Kos, DO;  Location: [facility];  Service: Plastics;  Laterality: Bilateral;    There were no vitals filed for this visit.  Visit Diagnosis:  Lymphedema of upper extremity following lymphadenectomy  Myofascial muscle pain  Shoulder joint stiffness, right  Muscle spasms of neck  Muscle weakness of right upper extremity  Stiffness of joint, shoulder region, right  Right-sided back pain, unspecified location  Abnormal posture      Subjective Assessment - 10/01/14 1159    Subjective The needling was very beneficial today, she (PT) found out my Rt sternocleidomastoid is extremely tight and so I'm going to work on stretching that. Feel like my arm is getting smaller.   Currently in Pain? No/denies                         [facility] Adult PT Treatment/Exercise - 10/01/14 0001    Manual Therapy   Manual Lymphatic Drainage (MLD) In Supine: Short neck, Lt axilla and Rt inguinal nodes, anterior inter-axillary and Rt axillo-inguinal anastomosis and Rt UE from dorsal hand to lateral shoulder.    Compression Bandaging Biotone lotion applied, then bandaging with stockinette, Elastomull on all fingers one -four , Artiflex x 2, and 1-6 cm., 1-10 cm and 2-12 cm. bandages from right hand to axilla.  PT Short Term Goals - 09/26/14 1210    PT SHORT TERM GOAL #1   Title Patient will report a 25% reduction in pain with usual daily activities and home chores.   Period Weeks   Status Achieved   PT SHORT TERM GOAL #2   Title Patient will be able to resume a basic home ex program with minimal increase in pain    Time 4   Period Weeks   Status Achieved   PT SHORT TERM GOAL #3   Title Right shoulder flexion improved to 92 degrees needed for reaching shoulder height shelves.   Time 4   Period Weeks   Status Achieved   PT SHORT TERM GOAL #4   Title Quick DASH score improved from 70.5 "severe  difficulty" with ADLs level to 60 indicating improved function with less pain with grooming, household chores and sleep.     Time 4   Status Achieved         Short Term Clinic Goals - 09/03/14 1217    CC Short Term Goal  #1   Title short term goals= long term goals          PT Long Term Goals - 09/26/14 1210    PT LONG TERM GOAL #1   Title Independent with progressive HEP for further ROM, strengthening of Right UE.   Time 14   Period Weeks   Status On-going   PT LONG TERM GOAL #2   Title Right shoulder flexion and abduction AROM improved to 105 degrees needed for reaching eye level shelves in the kitchen.    flex 114, abde 95   Time 14   Period Weeks   Status On-going   PT LONG TERM GOAL #3   Title Right UE strength grossly 4-/5 needed for basic cooking/cleaning for her 3 children.    3/5   Time 14   Period Weeks   PT LONG TERM GOAL #4   Title Quick DASH score improved to 50 indicating "moderate difficulty" with basic ADLS rather than "severe difficulty'"   Time 14   Period Weeks   Status On-going   PT LONG TERM GOAL #5   Title Patient will report overall improvement in pain level 3/10 level or better  and improve  function at 50%.   Time 14   Period Weeks   Status On-going   PT LONG TERM GOAL #6   Title Pt will be able to manage pain at night in order to sleep 4 or more hours of restorative sleep. Pt will verbalize 3 pain management strategies for sleep/rest   Time 14   Period Weeks   Status On-going           Long Term Clinic Goals - 09/26/14 1253    CC Long Term Goal  #4   Title right arm circumference at 15 cm provximal to olecranon will be reduced by 3 sm   Status Partially Met            Plan - 10/01/14 1203    Clinical Impression Statement Pt reports can tell her upper arm is smaller. Also palpably softer at upper arm today as well, pt should be ready for DC soon per last note.   Pt will benefit from skilled therapeutic intervention in order to  improve on the following deficits Impaired UE functional use;Increased fascial restricitons;Increased edema;Impaired flexibility;Postural dysfunction;Obesity;Decreased scar mobility;Decreased range of motion   Rehab Potential Good   Clinical Impairments Affecting Rehab Potential multiple  medical and chronic pain issues   PT Frequency 2x / week   PT Duration 6 weeks   PT Treatment/Interventions ADLs/Self Care Home Management;Dry needling;Manual techniques;Scar mobilization;Therapeutic exercise;Therapeutic activities;Patient/family education   PT Next Visit Plan remeasure arm, ask patient to bring in compressin sleeve and continue preparation for disharge  Gcode next   Consulted and Agree with Plan of Care Patient        Problem List Patient Active Problem List   Diagnosis Date Noted  . Diabetes mellitus type II, non insulin dependent 09/19/2014  . Breast cancer, right breast 05/24/2014  . Myalgia and myositis 05/17/2014  . Neoplasm related pain 03/12/2014  . CN (constipation) 11/26/2013  . H/O reduction mammoplasty 10/30/2013  . Seizure 10/06/2013  . Status post bilateral breast implants 08/31/2013  . Acquired absence of breast and absent nipple 08/23/2013  . S/P breast reconstruction, right 08/23/2013  . AC joint arthropathy 07/02/2013  . Routine adult health maintenance 05/29/2013  . Hypokalemia 05/21/2013  . Chronic pain 04/19/2013  . Elevated blood sugar 03/09/2013  . Wheezing 02/22/2013  . Acute bronchitis 02/22/2013  . Lymphedema 05/24/2011  . RHINOSINUSITIS, CHRONIC 04/08/2010  . Obesity 08/04/2006  . DEPRESSIVE DISORDER, NOS 08/04/2006  . RHINITIS, ALLERGIC 08/04/2006    Otelia Limes, PTA 10/01/2014, 12:06 PM  [place], [place], [address] Phone: [phone]   Fax:  [phone]

## 2014-10-01 NOTE — Patient Instructions (Addendum)
It was great to see you again today!  Stay on lasix 20mg  daily Getting echo - we'll call you with appointment  Start metformin 500mg  daily  In two weeks increase to 500mg  twice a day See your eye doctor for an eye appointment  Follow up with me in 1 month.  Be well, Dr. Ardelia Mems     Type 2 Diabetes Mellitus Type 2 diabetes mellitus, often simply referred to as type 2 diabetes, is a long-lasting (chronic) disease. In type 2 diabetes, the pancreas does not make enough insulin (a hormone), the cells are less responsive to the insulin that is made (insulin resistance), or both. Normally, insulin moves sugars from food into the tissue cells. The tissue cells use the sugars for energy. The lack of insulin or the lack of normal response to insulin causes excess sugars to build up in the blood instead of going into the tissue cells. As a result, high blood sugar (hyperglycemia) develops. The effect of high sugar (glucose) levels can cause many complications. Type 2 diabetes was also previously called adult-onset diabetes, but it can occur at any age.  RISK FACTORS  A [place] is predisposed to developing type 2 diabetes if someone in the family has the disease and also has one or more of the following primary risk factors:  Overweight.  An inactive lifestyle.  A history of consistently eating high-calorie foods. Maintaining a normal weight and regular physical activity can reduce the chance of developing type 2 diabetes. SYMPTOMS  A [place] with type 2 diabetes may not show symptoms initially. The symptoms of type 2 diabetes appear slowly. The symptoms include:  Increased thirst (polydipsia).  Increased urination (polyuria).  Increased urination during the night (nocturia).  Weight loss. This weight loss may be rapid.  Frequent, recurring infections.  Tiredness (fatigue).  Weakness.  Vision changes, such as blurred vision.  Fruity smell to your breath.  Abdominal  pain.  Nausea or vomiting.  Cuts or bruises which are slow to heal.  Tingling or numbness in the hands or feet. DIAGNOSIS Type 2 diabetes is frequently not diagnosed until complications of diabetes are present. Type 2 diabetes is diagnosed when symptoms or complications are present and when blood glucose levels are increased. Your blood glucose level may be checked by one or more of the following blood tests:  A fasting blood glucose test. You will not be allowed to eat for at least 8 hours before a blood sample is taken.  A random blood glucose test. Your blood glucose is checked at any time of the day regardless of when you ate.  A hemoglobin A1c blood glucose test. A hemoglobin A1c test provides information about blood glucose control over the previous 3 months.  An oral glucose tolerance test (OGTT). Your blood glucose is measured after you have not eaten (fasted) for 2 hours and then after you drink a glucose-containing beverage. TREATMENT   You may need to take insulin or diabetes medicine daily to keep blood glucose levels in the desired range.  If you use insulin, you may need to adjust the dosage depending on the carbohydrates that you eat with each meal or snack. The treatment goal is to maintain the before meal blood sugar (preprandial glucose) level at 70-130 mg/dL. HOME CARE INSTRUCTIONS   Have your hemoglobin A1c level checked twice a year.  Perform daily blood glucose monitoring as directed by your health care provider.  Monitor urine ketones when you are ill and as directed by your  health care provider.  Take your diabetes medicine or insulin as directed by your health care provider to maintain your blood glucose levels in the desired range.  Never run out of diabetes medicine or insulin. It is needed every day.  If you are using insulin, you may need to adjust the amount of insulin given based on your intake of carbohydrates. Carbohydrates can raise blood glucose  levels but need to be included in your diet. Carbohydrates provide vitamins, minerals, and fiber which are an essential part of a healthy diet. Carbohydrates are found in fruits, vegetables, whole grains, dairy products, legumes, and foods containing added sugars.  Eat healthy foods. You should make an appointment to see a registered dietitian to help you create an eating plan that is right for you.  Lose weight if you are overweight.  Carry a medical alert card or wear your medical alert jewelry.  Carry a 15-gram carbohydrate snack with you at all times to treat low blood glucose (hypoglycemia). Some examples of 15-gram carbohydrate snacks include:  Glucose tablets, 3 or 4.  Glucose gel, 15-gram tube.  Raisins, 2 tablespoons (24 grams).  Jelly beans, 6.  Animal crackers, 8.  Regular pop, 4 ounces (120 mL).  Gummy treats, 9.  Recognize hypoglycemia. Hypoglycemia occurs with blood glucose levels of 70 mg/dL and below. The risk for hypoglycemia increases when fasting or skipping meals, during or after intense exercise, and during sleep. Hypoglycemia symptoms can include:  Tremors or shakes.  Decreased ability to concentrate.  Sweating.  Increased heart rate.  Headache.  Dry mouth.  Hunger.  Irritability.  Anxiety.  Restless sleep.  Altered speech or coordination.  Confusion.  Treat hypoglycemia promptly. If you are alert and able to safely swallow, follow the 15:15 rule:  Take 15-20 grams of rapid-acting glucose or carbohydrate. Rapid-acting options include glucose gel, glucose tablets, or 4 ounces (120 mL) of fruit juice, regular soda, or low-fat milk.  Check your blood glucose level 15 minutes after taking the glucose.  Take 15-20 grams more of glucose if the repeat blood glucose level is still 70 mg/dL or below.  Eat a meal or snack within 1 hour once blood glucose levels return to normal.  Be alert to feeling very thirsty and urinating more frequently  than usual, which are early signs of hyperglycemia. An early awareness of hyperglycemia allows for prompt treatment. Treat hyperglycemia as directed by your health care provider.  Engage in at least 150 minutes of moderate-intensity physical activity a week, spread over at least 3 days of the week or as directed by your health care provider. In addition, you should engage in resistance exercise at least 2 times a week or as directed by your health care provider. Try to spend no more than 90 minutes at one time inactive.  Adjust your medicine and food intake as needed if you start a new exercise or sport.  Follow your sick-day plan anytime you are unable to eat or drink as usual.  Do not use any tobacco products including cigarettes, chewing tobacco, or electronic cigarettes. If you need help quitting, ask your health care provider.  Limit alcohol intake to no more than 1 drink per day for nonpregnant women and 2 drinks per day for men. You should drink alcohol only when you are also eating food. Talk with your health care provider whether alcohol is safe for you. Tell your health care provider if you drink alcohol several times a week.  Keep all follow-up visits  as directed by your health care provider. This is important.  Schedule an eye exam soon after the diagnosis of type 2 diabetes and then annually.  Perform daily skin and foot care. Examine your skin and feet daily for cuts, bruises, redness, nail problems, bleeding, blisters, or sores. A foot exam by a health care provider should be done annually.  Brush your teeth and gums at least twice a day and floss at least once a day. Follow up with your dentist regularly.  Share your diabetes management plan with your workplace or school.  Stay up-to-date with immunizations. It is recommended that people with diabetes who are over 40 years old get the pneumonia vaccine. In some cases, two separate shots may be given. Ask your health care  provider if your pneumonia vaccination is up-to-date.  Learn to manage stress.  Obtain ongoing diabetes education and support as needed.  Participate in or seek rehabilitation as needed to maintain or improve independence and quality of life. Request a physical or occupational therapy referral if you are having foot or hand numbness, or difficulties with grooming, dressing, eating, or physical activity. SEEK MEDICAL CARE IF:   You are unable to eat food or drink fluids for more than 6 hours.  You have nausea and vomiting for more than 6 hours.  Your blood glucose level is over 240 mg/dL.  There is a change in mental status.  You develop an additional serious illness.  You have diarrhea for more than 6 hours.  You have been sick or have had a fever for a couple of days and are not getting better.  You have pain during any physical activity.  SEEK IMMEDIATE MEDICAL CARE IF:  You have difficulty breathing.  You have moderate to large ketone levels. MAKE SURE YOU:  Understand these instructions.  Will watch your condition.  Will get help right away if you are not doing well or get worse. Document Released: 05/24/2005 Document Revised: 10/08/2013 Document Reviewed: 12/21/2011 [facility] Patient Information 2015 [place], [place]. This information is not intended to replace advice given to you by your health care provider. Make sure you discuss any questions you have with your health care provider.

## 2014-10-01 NOTE — Therapy (Signed)
[place], [place], [address] Phone: [phone]   Fax:  [phone]  Physical Therapy Treatment  Patient Details  Name: Karen Hopkins MRN: [id number] Date of Birth: 12/18/1975 Referring Provider:  [name], MD  Encounter Date: 10/01/2014      PT End of Session - 10/01/14 1745    Visit Number 21   Number of Visits 28   Date for PT Re-Evaluation 10/22/14   PT Start Time 5027   PT Stop Time 1100   PT Time Calculation (min) 45 min   Activity Tolerance Patient tolerated treatment well      Past Medical History  Diagnosis Date  . Seasonal allergies   . Lymphedema of arm     right; no BP or puncture to right arm  . Depression   . Anxiety   . Sinus headache   . Hypertension     under control with med., has been on med. x 2 yr.  . History of breast cancer 2011    right  . History of chemotherapy 2011  . History of radiation therapy 2011    Past Surgical History  Procedure Laterality Date  . Supra-umbilical hernia  7412  . Nasal septum surgery    . Latissimus flap to breast Right 03/12/2013    Procedure: RIGHT BREAST LATISSIMUS FLAP WITH EXPANDER PLACEMENT;  Surgeon: Theodoro Kos, DO;  Location: [facility];  Service: Plastics;  Laterality: Right;  . Cesarean section  07/15/2001; 03/18/2004; 11/21/2008  . Tubal ligation  11/21/2008  . Portacath placement Left 09/12/2009  . Modified radical mastectomy Right 03/11/2010  . Port-a-cath removal Left 12/01/2010  . Removal of tissue expander and placement of implant Right 08/23/2013    Procedure: REMOVAL RIGHT TISSUE EXPANDER AND PLACEMENT OF IMPLANT TO RIGHT BREAST ;  Surgeon: Theodoro Kos, DO;  Location: [facility];  Service: Plastics;  Laterality: Right;  . Breast reduction surgery Left 08/23/2013    Procedure: LEFT BREAST REDUCTION  ;  Surgeon: Theodoro Kos, DO;  Location: [facility];  Service: Plastics;  Laterality:  Left;  . Liposuction Bilateral 08/23/2013    Procedure: LIPOSUCTION;  Surgeon: Theodoro Kos, DO;  Location: [facility];  Service: Plastics;  Laterality: Bilateral;    There were no vitals filed for this visit.  Visit Diagnosis:  Myofascial muscle pain  Shoulder joint stiffness, right  Muscle spasms of neck  Muscle weakness of right upper extremity      Subjective Assessment - 10/01/14 1159    Subjective The needling was very beneficial today, she (PT) found out my Rt sternocleidomastoid is extremely tight and so I'm going to work on stretching that. Feel like my arm is getting smaller.   Currently in Pain? No/denies                         [facility] Adult PT Treatment/Exercise - 10/01/14 1737    Neck Exercises: Seated   Other Seated Exercise scalene and SCM stretch with sheet 3x 20 sec   Neck Exercises: Sidelying   Other Sidelying Exercise shoulder sweep per HEP   Manual Therapy   Manual Therapy Passive ROM   Joint Mobilization 1st rib mob grade 4 3x   Myofascial Release suboccipital release 3x 20;SCM bending and MFR  upper trap contract/relax; SCM and scalene MFR   Manual Traction 3x 30 sec          Trigger  Point Dry Needling - 10/01/14 1743    Consent Given? Yes   Muscles Treated Upper Body Sternocleidomastoid;Upper trapezius;Levator scapulae  scalene right; cervical multifidi and suboccipitals B   Upper Trapezius Response Twitch reponse elicited;Palpable increased muscle length   Oblique Capitus Response Palpable increased muscle length   SubOccipitals Response Palpable increased muscle length   Levator Scapulae Response Twitch response elicited;Palpable increased muscle length              PT Education - 10/01/14 1745    Education provided Yes   Education Details SCM/scalene stretch and shoulder sweep   [place](s) Educated Patient   Methods Explanation;Demonstration;Handout   Comprehension Verbalized understanding;Returned  demonstration          PT Short Term Goals - 10/01/14 1751    PT SHORT TERM GOAL #1   Title Patient will report a 25% reduction in pain with usual daily activities and home chores.   Status Achieved   PT SHORT TERM GOAL #2   Title Patient will be able to resume a basic home ex program with minimal increase in pain    Status Achieved   PT SHORT TERM GOAL #3   Title Right shoulder flexion improved to 92 degrees needed for reaching shoulder height shelves.   Status Achieved   PT SHORT TERM GOAL #4   Title Quick DASH score improved from 70.5 "severe difficulty" with ADLs level to 60 indicating improved function with less pain with grooming, household chores and sleep.     Status Achieved         Short Term Clinic Goals - 09/03/14 1217    CC Short Term Goal  #1   Title short term goals= long term goals          PT Long Term Goals - 10/01/14 1751    PT LONG TERM GOAL #1   Title Independent with progressive HEP for further ROM, strengthening of Right UE.   Time 14   Period Weeks   Status On-going   PT LONG TERM GOAL #2   Title Right shoulder flexion and abduction AROM improved to 105 degrees needed for reaching eye level shelves in the kitchen.     Time 14   Period Weeks   Status On-going   PT LONG TERM GOAL #3   Title Right UE strength grossly 4-/5 needed for basic cooking/cleaning for her 3 children.     Time 14   Period Weeks   Status On-going   PT LONG TERM GOAL #4   Title Quick DASH score improved to 50 indicating "moderate difficulty" with basic ADLS rather than "severe difficulty'"   Time 14   Period Weeks   Status On-going   PT LONG TERM GOAL #5   Title Patient will report overall improvement in pain level 3/10 level or better  and improve  function at 50%.   Time 14   Period Weeks   Status On-going   PT LONG TERM GOAL #6   Title Pt will be able to manage pain at night in order to sleep 4 or more hours of restorative sleep. Pt will verbalize 3 pain  management strategies for sleep/rest   Time 14   Period Weeks   Status On-going           Long Term Clinic Goals - 09/26/14 1253    CC Long Term Goal  #4   Title right arm circumference at 15 cm provximal to olecranon will be reduced by 3 sm  Status Partially Met            Plan - 10/01/14 1746    Clinical Impression Statement Patient with improved periscapular pain with primary complaint now in right sternocleidomastoid SCM and scalenes and headaches.  Patient with improved soft tissue length and mobility following treatment session.  Therapist closely monitoring response throughout treatment session.  Progress has been slowed secondary to patient's comprehensive history of CA, multiple surgeries and lymphedema which affect myofascial pain.     PT Next Visit Plan Continue to address SCM/scalene restrictions;  recheck ROM cervical; suboccipital techniques;  mob with movement with towel?        Problem List Patient Active Problem List   Diagnosis Date Noted  . Type 2 diabetes mellitus without complication 35/24/8185  . Lower extremity edema 10/01/2014  . Diabetes mellitus type II, non insulin dependent 09/19/2014  . Breast cancer, right breast 05/24/2014  . Myalgia and myositis 05/17/2014  . Neoplasm related pain 03/12/2014  . CN (constipation) 11/26/2013  . H/O reduction mammoplasty 10/30/2013  . Seizure 10/06/2013  . Status post bilateral breast implants 08/31/2013  . Acquired absence of breast and absent nipple 08/23/2013  . S/P breast reconstruction, right 08/23/2013  . AC joint arthropathy 07/02/2013  . Routine adult health maintenance 05/29/2013  . Hypokalemia 05/21/2013  . Chronic pain 04/19/2013  . Elevated blood sugar 03/09/2013  . Wheezing 02/22/2013  . Acute bronchitis 02/22/2013  . Lymphedema 05/24/2011  . RHINOSINUSITIS, CHRONIC 04/08/2010  . Obesity 08/04/2006  . DEPRESSIVE DISORDER, NOS 08/04/2006  . RHINITIS, ALLERGIC 08/04/2006    Alvera Singh 10/01/2014, 5:52 PM  [facility] [address] [place], [place], [address] Phone: [phone]   Fax:  [phone]

## 2014-10-01 NOTE — Patient Instructions (Signed)
HEP: SCM/scalene stretch seated with sheet and sidelying shoulder sweep

## 2014-10-02 ENCOUNTER — Other Ambulatory Visit: Payer: Medicare Other

## 2014-10-02 ENCOUNTER — Ambulatory Visit (HOSPITAL_COMMUNITY)
Admission: RE | Admit: 2014-10-02 | Discharge: 2014-10-02 | Disposition: A | Discharge status: Home / Self Care | Payer: Medicare Other | Source: Ambulatory Visit | Attending: Family Medicine | Admitting: Family Medicine

## 2014-10-02 DIAGNOSIS — R0601 Orthopnea: Secondary | ICD-10-CM | POA: Insufficient documentation

## 2014-10-02 DIAGNOSIS — E119 Type 2 diabetes mellitus without complications: Secondary | ICD-10-CM

## 2014-10-02 LAB — BASIC METABOLIC PANEL
BUN: 13 mg/dL (ref 6–23)
CALCIUM: 9.1 mg/dL (ref 8.4–10.5)
CO2: 25 mEq/L (ref 19–32)
Chloride: 103 mEq/L (ref 96–112)
Creat: 0.59 mg/dL (ref 0.50–1.10)
Glucose, Bld: 102 mg/dL — ABNORMAL HIGH (ref 70–99)
Potassium: 4 mEq/L (ref 3.5–5.3)
Sodium: 138 mEq/L (ref 135–145)

## 2014-10-02 LAB — LIPID PANEL
CHOL/HDL RATIO: 3.8 ratio
Cholesterol: 87 mg/dL (ref 0–200)
HDL: 23 mg/dL — ABNORMAL LOW (ref >=46)
LDL CALC: 51 mg/dL (ref 0–99)
Triglycerides: 67 mg/dL (ref <150)
VLDL: 13 mg/dL (ref 0–40)

## 2014-10-02 NOTE — Progress Notes (Signed)
BMP AND FLP DONE TODAY Karen Hopkins

## 2014-10-03 ENCOUNTER — Ambulatory Visit: Payer: Medicare Other

## 2014-10-03 DIAGNOSIS — E8989 Other postprocedural endocrine and metabolic complications and disorders: Secondary | ICD-10-CM

## 2014-10-03 DIAGNOSIS — I890 Lymphedema, not elsewhere classified: Secondary | ICD-10-CM

## 2014-10-03 DIAGNOSIS — M5489 Other dorsalgia: Secondary | ICD-10-CM

## 2014-10-03 DIAGNOSIS — M25611 Stiffness of right shoulder, not elsewhere classified: Secondary | ICD-10-CM

## 2014-10-03 DIAGNOSIS — M62838 Other muscle spasm: Secondary | ICD-10-CM

## 2014-10-03 DIAGNOSIS — R293 Abnormal posture: Secondary | ICD-10-CM

## 2014-10-03 DIAGNOSIS — M6281 Muscle weakness (generalized): Secondary | ICD-10-CM

## 2014-10-03 DIAGNOSIS — M7918 Myalgia, other site: Secondary | ICD-10-CM

## 2014-10-03 NOTE — Therapy (Signed)
[place], [place], [address] Phone: [phone]   Fax:  [phone]  Physical Therapy Treatment  Patient Details  Name: Karen Hopkins MRN: [id number] Date of Birth: [birth year] Referring Provider:  [name], MD  Encounter Date: 10/03/2014      PT End of Session - 10/03/14 1159    Visit Number 22   Number of Visits 28   Date for PT Re-Evaluation 10/22/14   PT Start Time 1106   PT Stop Time 1152   PT Time Calculation (min) 46 min      Past Medical History  Diagnosis Date  . Seasonal allergies   . Lymphedema of arm     right; no BP or puncture to right arm  . Depression   . Anxiety   . Sinus headache   . Hypertension     under control with med., has been on med. x 2 yr.  . History of breast cancer 2011    right  . History of chemotherapy 2011  . History of radiation therapy 2011    Past Surgical History  Procedure Laterality Date  . Supra-umbilical hernia  6387  . Nasal septum surgery    . Latissimus flap to breast Right 03/12/2013    Procedure: RIGHT BREAST LATISSIMUS FLAP WITH EXPANDER PLACEMENT;  Surgeon: Theodoro Kos, DO;  Location: [facility];  Service: Plastics;  Laterality: Right;  . Cesarean section  07/15/2001; 03/18/2004; 11/21/2008  . Tubal ligation  11/21/2008  . Portacath placement Left 09/12/2009  . Modified radical mastectomy Right 03/11/2010  . Port-a-cath removal Left 12/01/2010  . Removal of tissue expander and placement of implant Right 08/23/2013    Procedure: REMOVAL RIGHT TISSUE EXPANDER AND PLACEMENT OF IMPLANT TO RIGHT BREAST ;  Surgeon: Theodoro Kos, DO;  Location: [facility];  Service: Plastics;  Laterality: Right;  . Breast reduction surgery Left 08/23/2013    Procedure: LEFT BREAST REDUCTION  ;  Surgeon: Theodoro Kos, DO;  Location: [facility];  Service: Plastics;  Laterality: Left;  . Liposuction Bilateral 08/23/2013   Procedure: LIPOSUCTION;  Surgeon: Theodoro Kos, DO;  Location: [facility];  Service: Plastics;  Laterality: Bilateral;    There were no vitals filed for this visit.  Visit Diagnosis:  Myofascial muscle pain  Shoulder joint stiffness, right  Muscle spasms of neck  Muscle weakness of right upper extremity  Lymphedema of upper extremity following lymphadenectomy  Stiffness of joint, shoulder region, right  Right-sided back pain, unspecified location  Abnormal posture      Subjective Assessment - 10/03/14 1110    Subjective Have a little bit of a sinus HA today. And saw the doctor Tuesday and she said I do have diabetes even though its the lowest number (6.8).   Currently in Pain? No/denies                         [facility] Adult PT Treatment/Exercise - 10/03/14 0001    Manual Therapy   Manual Lymphatic Drainage (MLD) In Supine: Short neck, Lt axilla and Rt inguinal nodes, anterior inter-axillary and Rt axillo-inguinal anastomosis and Rt UE from dorsal hand to lateral shoulder.    Compression Bandaging Issued 3 new (1-6 and 2-12 cm) bandages as pts old ones had lost alot of elasticity; Biotone, TG soft med stockinette, Elastomull to fingers 1-4, pt bandaged hand with 6 cm while I deomnstrated for pt during using her  old 6 cm on myself throughout, then therapist finished bandaging arm with 3 -12 cm (used one of pts old ones for herring bone for the second 12 cm) from hand to axilla.                 PT Education - 10/03/14 1158    Education provided Yes   Education Details Began instructing pt in self bandaging   [place]ickle(s) Educated Patient   Methods Explanation;Demonstration;Handout   Comprehension Verbalized understanding;Returned demonstration;Need further instruction          PT Short Term Goals - 10/01/14 1751    PT SHORT TERM GOAL #1   Title Patient will report a 25% reduction in pain with usual daily activities and home chores.    Status Achieved   PT SHORT TERM GOAL #2   Title Patient will be able to resume a basic home ex program with minimal increase in pain    Status Achieved   PT SHORT TERM GOAL #3   Title Right shoulder flexion improved to 92 degrees needed for reaching shoulder height shelves.   Status Achieved   PT SHORT TERM GOAL #4   Title Quick DASH score improved from 70.5 "severe difficulty" with ADLs level to 60 indicating improved function with less pain with grooming, household chores and sleep.     Status Achieved         Short Term Clinic Goals - 09/03/14 1217    CC Short Term Goal  #1   Title short term goals= long term goals          PT Long Term Goals - 10/01/14 1751    PT LONG TERM GOAL #1   Title Independent with progressive HEP for further ROM, strengthening of Right UE.   Time 14   Period Weeks   Status On-going   PT LONG TERM GOAL #2   Title Right shoulder flexion and abduction AROM improved to 105 degrees needed for reaching eye level shelves in the kitchen.     Time 14   Period Weeks   Status On-going   PT LONG TERM GOAL #3   Title Right UE strength grossly 4-/5 needed for basic cooking/cleaning for her 3 children.     Time 14   Period Weeks   Status On-going   PT LONG TERM GOAL #4   Title Quick DASH score improved to 50 indicating "moderate difficulty" with basic ADLS rather than "severe difficulty'"   Time 14   Period Weeks   Status On-going   PT LONG TERM GOAL #5   Title Patient will report overall improvement in pain level 3/10 level or better  and improve  function at 50%.   Time 14   Period Weeks   Status On-going   PT LONG TERM GOAL #6   Title Pt will be able to manage pain at night in order to sleep 4 or more hours of restorative sleep. Pt will verbalize 3 pain management strategies for sleep/rest   Time 14   Period Weeks   Status On-going           Long Term Clinic Goals - 09/26/14 1253    CC Long Term Goal  #4   Title right arm circumference at  15 cm provximal to olecranon will be reduced by 3 sm   Status Partially Met            Plan - 10/03/14 1159    Clinical Impression Statement Pt able to  return correct demo of hand bandage with PTA providing demo throuhgout and pt will try to rewrap her UE over weekend. She verbalized good understanding of this.   Pt will benefit from skilled therapeutic intervention in order to improve on the following deficits Impaired UE functional use;Increased fascial restricitons;Increased edema;Impaired flexibility;Postural dysfunction;Obesity;Decreased scar mobility;Decreased range of motion   Rehab Potential Good   Clinical Impairments Affecting Rehab Potential multiple medical and chronic pain issues   PT Frequency 2x / week   PT Duration 6 weeks   PT Treatment/Interventions ADLs/Self Care Home Management;Dry needling;Manual techniques;Scar mobilization;Therapeutic exercise;Therapeutic activities;Patient/family education   PT Next Visit Plan Continue to instruct pt in self bandaging to work towards discharge. Remeasure next.   Consulted and Agree with Plan of Care Patient        Problem List Patient Active Problem List   Diagnosis Date Noted  . Type 2 diabetes mellitus without complication 84/16/6063  . Lower extremity edema 10/01/2014  . Diabetes mellitus type II, non insulin dependent 09/19/2014  . Breast cancer, right breast 05/24/2014  . Myalgia and myositis 05/17/2014  . Neoplasm related pain 03/12/2014  . CN (constipation) 11/26/2013  . H/O reduction mammoplasty 10/30/2013  . Seizure 10/06/2013  . Status post bilateral breast implants 08/31/2013  . Acquired absence of breast and absent nipple 08/23/2013  . S/P breast reconstruction, right 08/23/2013  . AC joint arthropathy 07/02/2013  . Routine adult health maintenance 05/29/2013  . Hypokalemia 05/21/2013  . Chronic pain 04/19/2013  . Elevated blood sugar 03/09/2013  . Wheezing 02/22/2013  . Acute bronchitis 02/22/2013  .  Lymphedema 05/24/2011  . RHINOSINUSITIS, CHRONIC 04/08/2010  . Obesity 08/04/2006  . DEPRESSIVE DISORDER, NOS 08/04/2006  . RHINITIS, ALLERGIC 08/04/2006    Otelia Limes, PTA 10/03/2014, 12:06 PM  [place], [place], [address] Phone: [phone]   Fax:  [phone]

## 2014-10-04 ENCOUNTER — Ambulatory Visit: Payer: Medicare Other | Admitting: Physical Therapy

## 2014-10-04 DIAGNOSIS — M25611 Stiffness of right shoulder, not elsewhere classified: Secondary | ICD-10-CM

## 2014-10-04 DIAGNOSIS — E8989 Other postprocedural endocrine and metabolic complications and disorders: Secondary | ICD-10-CM | POA: Diagnosis not present

## 2014-10-04 DIAGNOSIS — M7918 Myalgia, other site: Secondary | ICD-10-CM

## 2014-10-04 DIAGNOSIS — M6281 Muscle weakness (generalized): Secondary | ICD-10-CM

## 2014-10-04 DIAGNOSIS — M62838 Other muscle spasm: Secondary | ICD-10-CM

## 2014-10-04 NOTE — Therapy (Signed)
[place], [place], [address] Phone: [phone]   Fax:  [phone]  Physical Therapy Treatment  Patient Details  Name: Karen Hopkins MRN: [id number] Date of Birth: 1975/09/13 Referring Provider:  [name], MD  Encounter Date: 10/04/2014      PT End of Session - 10/04/14 1136    Visit Number 23   Number of Visits 28   Date for PT Re-Evaluation 10/22/14   PT Start Time 1021   PT Stop Time 1115   PT Time Calculation (min) 54 min   Activity Tolerance Patient tolerated treatment well      Past Medical History  Diagnosis Date  . Seasonal allergies   . Lymphedema of arm     right; no BP or puncture to right arm  . Depression   . Anxiety   . Sinus headache   . Hypertension     under control with med., has been on med. x 2 yr.  . History of breast cancer 2011    right  . History of chemotherapy 2011  . History of radiation therapy 2011    Past Surgical History  Procedure Laterality Date  . Supra-umbilical hernia  3785  . Nasal septum surgery    . Latissimus flap to breast Right 03/12/2013    Procedure: RIGHT BREAST LATISSIMUS FLAP WITH EXPANDER PLACEMENT;  Surgeon: Theodoro Kos, DO;  Location: [facility];  Service: Plastics;  Laterality: Right;  . Cesarean section  07/15/2001; 03/18/2004; 11/21/2008  . Tubal ligation  11/21/2008  . Portacath placement Left 09/12/2009  . Modified radical mastectomy Right 03/11/2010  . Port-a-cath removal Left 12/01/2010  . Removal of tissue expander and placement of implant Right 08/23/2013    Procedure: REMOVAL RIGHT TISSUE EXPANDER AND PLACEMENT OF IMPLANT TO RIGHT BREAST ;  Surgeon: Theodoro Kos, DO;  Location: [facility];  Service: Plastics;  Laterality: Right;  . Breast reduction surgery Left 08/23/2013    Procedure: LEFT BREAST REDUCTION  ;  Surgeon: Theodoro Kos, DO;  Location: [facility];  Service: Plastics;  Laterality:  Left;  . Liposuction Bilateral 08/23/2013    Procedure: LIPOSUCTION;  Surgeon: Theodoro Kos, DO;  Location: [facility];  Service: Plastics;  Laterality: Bilateral;    There were no vitals filed for this visit.  Visit Diagnosis:  Myofascial muscle pain  Shoulder joint stiffness, right  Muscle spasms of neck  Muscle weakness of right upper extremity      Subjective Assessment - 10/04/14 1123    Subjective Patient reports she is doing pretty well today, just sleepy.  States her right (scalene and sternocleidomastoid) muscles are really sore today.  Right UE in compression wrapping.  States she probably doesn't need dry needling today.   Currently in Pain? Yes   Pain Score 2    Pain Orientation Right   Pain Type Chronic pain   Pain Frequency Constant   Aggravating Factors  using her arm   Pain Relieving Factors deep tissue massage                         [facility] Adult PT Treatment/Exercise - 10/04/14 1130    Shoulder Exercises: Prone   Other Prone Exercises lat stretch in kneeling on bench 3x 30 sec   Other Prone Exercises lower trap arm lifts with UEs on stool 5x right/left   Shoulder Exercises: Standing   Flexion AAROM;Both;15 reps  using  foam roll   Other Standing Exercises standing pectoral stretch against wall review for home   Manual Therapy   Joint Mobilization Cervical P-A mob and lateral mobs grade 3 3x 20 sec C4-C7;    Myofascial Release suboccipital release 3x 20;SCM bending and MFR to rhomboids , upper traps, levator   Manual Traction 3x 30 sec                PT Short Term Goals - 10/04/14 1141    PT SHORT TERM GOAL #1   Title Patient will report a 25% reduction in pain with usual daily activities and home chores.   Status Achieved   PT SHORT TERM GOAL #2   Title Patient will be able to resume a basic home ex program with minimal increase in pain    Status Achieved   PT SHORT TERM GOAL #3   Title Right shoulder flexion  improved to 92 degrees needed for reaching shoulder height shelves.   Status Achieved   PT SHORT TERM GOAL #4   Title Quick DASH score improved from 70.5 "severe difficulty" with ADLs level to 60 indicating improved function with less pain with grooming, household chores and sleep.     Status Achieved           PT Long Term Goals - 10/04/14 1141    PT LONG TERM GOAL #1   Title Independent with progressive HEP for further ROM, strengthening of Right UE.   Time 14   Period Weeks   Status On-going   PT LONG TERM GOAL #2   Title Right shoulder flexion and abduction AROM improved to 105 degrees needed for reaching eye level shelves in the kitchen.     Time 14   Period Weeks   Status On-going   PT LONG TERM GOAL #3   Title Right UE strength grossly 4-/5 needed for basic cooking/cleaning for her 3 children.     Time 14   Period Weeks   Status On-going   PT LONG TERM GOAL #4   Title Quick DASH score improved to 50 indicating "moderate difficulty" with basic ADLS rather than "severe difficulty'"   Time 14   Period Weeks   Status On-going   PT LONG TERM GOAL #5   Title Patient will report overall improvement in pain level 3/10 level or better  and improve  function at 50%.   Time 14   Period Weeks   Status On-going   PT LONG TERM GOAL #6   Title Pt will be able to manage pain at night in order to sleep 4 or more hours of restorative sleep. Pt will verbalize 3 pain management strategies for sleep/rest   Time 14   Period Weeks   Status On-going               Plan - 10/04/14 1137    Clinical Impression Statement Patient with decreased trigger point size and number.  Improved muscle length of upper traps, levator muscles.  Still with significant scapular muscle weakness especially lower traps. Should meet remaining goals in next 2-3 weeks.   Therapist closely monitoring response throughout treatment session.     PT Next Visit Plan Recheck cervical and shoulder AROM and  progress toward other goals; manual techniques, dry needling prn        Problem List Patient Active Problem List   Diagnosis Date Noted  . Type 2 diabetes mellitus without complication 69/48/5462  . Lower extremity edema 10/01/2014  .  Diabetes mellitus type II, non insulin dependent 09/19/2014  . Breast cancer, right breast 05/24/2014  . Myalgia and myositis 05/17/2014  . Neoplasm related pain 03/12/2014  . CN (constipation) 11/26/2013  . H/O reduction mammoplasty 10/30/2013  . Seizure 10/06/2013  . Status post bilateral breast implants 08/31/2013  . Acquired absence of breast and absent nipple 08/23/2013  . S/P breast reconstruction, right 08/23/2013  . AC joint arthropathy 07/02/2013  . Routine adult health maintenance 05/29/2013  . Hypokalemia 05/21/2013  . Chronic pain 04/19/2013  . Elevated blood sugar 03/09/2013  . Wheezing 02/22/2013  . Acute bronchitis 02/22/2013  . Lymphedema 05/24/2011  . RHINOSINUSITIS, CHRONIC 04/08/2010  . Obesity 08/04/2006  . DEPRESSIVE DISORDER, NOS 08/04/2006  . RHINITIS, ALLERGIC 08/04/2006    Alvera Singh 10/04/2014, 11:44 AM  [facility] [address] [place], [place], [address] Phone: [phone]   Fax:  [phone]   Ruben Im, PT 10/04/2014 11:46 AM Phone: [phone] Fax: [phone]

## 2014-10-04 NOTE — Progress Notes (Signed)
Patient ID: Karen Hopkins, female   DOB: November 18, 1975, 39 y.o.   MRN: [id number] Date of Visit: 10/01/2014    HPI:  Diabetes: Recently had A1c of 6.8, which is diagnostic for diabetes. She previously took metformin for prediabetes, but stopped taking this after about a month. She is agreeable to returning to this medication now. Has not had eye exam in the last year. Has upcoming nutritional visit already planned.  Lower extremity edema: Previously seen here at the family [facility] on April 8 for lower extremity edema. Started on Lasix 20 mg daily. It took about 2 weeks, but she has finally noticed improvement in the swelling. On further review of systems, she does endorse some orthopnea type symptoms. Unable to lay flat. Denies dyspnea with exertion. Has occasional mild chest pain which she attributes to her prior breast cancer surgery. The pain is reproducible with palpation.  ROS: See HPI.  [place]: History of breast cancer, treated with chemotherapy and surgery. Has chronic residual lymphedema of her right arm. Currently undergoing dry needling in physical therapy for her lymphedema and chronic pain in this area. Followed by pain management. Recently began Zoladex treatments to limiting her periods while on tamoxifen.  PHYSICAL EXAM: BP 120/79 mmHg  Pulse 101  Temp(Src) 99.3 F (37.4 C) (Oral)  Ht 5\' 4"  (1.626 m)  Wt 282 lb 12.8 oz (128.277 kg)  BMI 48.52 kg/m2 Gen: No acute distress, pleasant, cooperative HEENT: Normocephalic, atraumatic Heart: Regular rate and rhythm, no murmur, no S3 appreciated Lungs: Clear to auscultation bilaterally, normal respiratory effort Neuro: Grossly nonfocal, speech normal Ext: 1+ pitting edema bilateral ankles  ASSESSMENT/PLAN:  Type 2 diabetes mellitus without complication Newly diagnosed with recent A1c of 6.8. Will start metformin 500mg  daily, increase to 500mg  BID if tolerated after 2 weeks. Instructed pt to get diabetic eye exam She already  has appt set up with nutritionist, and will tailor this nutrition visit to diabetes Follow up with me in 1 month to see how things are going.   Lower extremity edema Relatively new in onset, for about 5 weeks now. Has responded to lasix 20mg  daily (although delayed response as she reports it just started getting better in last several days). She is also endorsing some orthopnea-type symptoms. No hypoxia with ambulation here in clinic. No dyspnea on exertion or anginal-type chest pain. She has risk factors for CHF, including prior chemotherapy with doxorubicin. EKG today normal without any signs of ischemia. Plan: -Obtain 2-D echo to evaluate cardiac function -Continue Lasix 20 mg daily -Return for fasting lipids and BMET to ensure renal function stable while on Lasix (wanted to get BMET today, however lab already closed) -Follow up with me in one month.    FOLLOW UP: F/u in 1 month for above issues  [place] J. Ardelia Mems, [place]

## 2014-10-07 ENCOUNTER — Encounter: Payer: Medicare Other | Attending: Family Medicine | Admitting: Dietician

## 2014-10-07 ENCOUNTER — Other Ambulatory Visit: Payer: Self-pay | Admitting: Oncology

## 2014-10-07 ENCOUNTER — Ambulatory Visit (HOSPITAL_COMMUNITY): Payer: Medicare Other

## 2014-10-07 ENCOUNTER — Encounter: Payer: Self-pay | Admitting: Dietician

## 2014-10-07 VITALS — Ht 64.0 in | Wt 281.6 lb

## 2014-10-07 DIAGNOSIS — E119 Type 2 diabetes mellitus without complications: Secondary | ICD-10-CM | POA: Insufficient documentation

## 2014-10-07 DIAGNOSIS — Z713 Dietary counseling and surveillance: Secondary | ICD-10-CM | POA: Insufficient documentation

## 2014-10-07 NOTE — Progress Notes (Signed)
  Medical Nutrition Therapy:  Appt start time: 1005 end time:  1100.   Assessment:  Primary concerns today: Karen Hopkins is referred here for weight gain/obesity and was recently diagnosed with diabetes and Hgb A1c is 6.8%. Started taking Metformin 2 x day. She is not testing her blood sugar.  Had breast cancer 3 years ago and gained about 90 lbs during chemo treatment. She is in physical therapy d/t lymphodema in her arm and myofacial pain. Would like to lose to 80 lbs. Did not have a problem with weight under cancer diagnosis.  She has 3 kids and is on disability. Really struggling with weight gain and feeling depressed. Taking medication for depression. Having edema and started taking Lasix. Started Herbalife shakes - 2 shakes per day and 1 meal and did it for a month. Had starting doing some exercise (3 x week) but stopped because she was swelling. Has a hx of some meal skipping. Has cut out all drinks except water. Having some constipation and taking Miralax.  Now she is not feeling hungry and not eating until she is hungry which means she skips lunch a lot. Doesn't sleep well at night.  Preferred Learning Style:   No preference indicated   Learning Readiness:   Ready  MEDICATIONS: see list    DIETARY INTAKE:  Usual eating pattern includes 2 meals and 1 snacks per day.  Avoided foods include: none    24-hr recall:  B ( AM): Herbalife shake or 2 eggs with toast  Snk ( AM): none L ( PM): none Snk ( PM): none D ( PM): Pizza, spaghetti, chicken and rice with vegetables, fettucini alfredo, mac and cheese with vegetable  Snk ( PM): Protein bar at night or veggie chips  Beverages: water sometimes with aloe  Usual physical activity: none but has approval to go back  Estimated energy needs: 1800 calories 200 g carbohydrates 135 g protein 50 g fat  Progress Towards Goal(s):  In progress.   Nutritional Diagnosis:  Dresser-2.1 Inpaired nutrition utilization As related to glucose  metabolism.  As evidenced by Hgb A1c of 6.8%.    Intervention:  Nutrition counseling provided. Plan: Aim to eat 3 meals per day and 2 snacks if you are hungry. Try to get on a regular sleeping and eating schedule.  Try to keep naps about 20-30 minutes. Plan to exercise at [facility] for 2 x week for 60 minutes. Increase when you can. Aim to fill half of your plate with vegetables for lunch and dinner. Have protein the size of the palm of your hand. Have nuts what you can fit in you palm.. Have 2 tablespoons of ranch on the side of your salad. Try to make your own with ranch packet and non fat greek yogurt.  Limit start to a quarter of your plate.  Have protein with carbohydrates for snacks. Portion snacks out (instead of eating from the box). Try to take 20 minutes to eat meals.  Teaching Method Utilized:  Visual Auditory Hands on  Handouts given during visit include:  MyPlate Handout  15 g CHO Carbs  Meal Card  Blood Glucose monitoring   Barriers to learning/adherence to lifestyle change: hx of several chronic health conditions   Demonstrated degree of understanding via:  Teach Back   Monitoring/Evaluation:  Dietary intake, exercise, and body weight in 1 month(s).

## 2014-10-07 NOTE — Patient Instructions (Signed)
Aim to eat 3 meals per day and 2 snacks if you are hungry. Try to get on a regular sleeping and eating schedule.  Try to keep naps about 20-30 minutes. Plan to exercise at [facility] for 2 x week for 60 minutes. Increase when you can. Aim to fill half of your plate with vegetables for lunch and dinner. Have protein the size of the palm of your hand. Have nuts what you can fit in you palm.. Have 2 tablespoons of ranch on the side of your salad. Try to make your own with ranch packet and non fat greek yogurt.  Limit start to a quarter of your plate.  Have protein with carbohydrates for snacks. Portion snacks out (instead of eating from the box). Try to take 20 minutes to eat meals.

## 2014-10-08 ENCOUNTER — Ambulatory Visit: Payer: Medicare Other | Attending: Physical Medicine & Rehabilitation | Admitting: Physical Therapy

## 2014-10-08 ENCOUNTER — Encounter: Payer: Self-pay | Admitting: Registered Nurse

## 2014-10-08 ENCOUNTER — Encounter: Payer: Medicare Other | Attending: Physical Medicine & Rehabilitation | Admitting: Registered Nurse

## 2014-10-08 ENCOUNTER — Ambulatory Visit: Payer: Medicare Other | Admitting: Physical Therapy

## 2014-10-08 VITALS — BP 146/80 | HR 94 | Resp 14

## 2014-10-08 DIAGNOSIS — M546 Pain in thoracic spine: Secondary | ICD-10-CM | POA: Diagnosis present

## 2014-10-08 DIAGNOSIS — M25611 Stiffness of right shoulder, not elsewhere classified: Secondary | ICD-10-CM | POA: Insufficient documentation

## 2014-10-08 DIAGNOSIS — M797 Fibromyalgia: Secondary | ICD-10-CM | POA: Diagnosis not present

## 2014-10-08 DIAGNOSIS — G894 Chronic pain syndrome: Secondary | ICD-10-CM

## 2014-10-08 DIAGNOSIS — I890 Lymphedema, not elsewhere classified: Secondary | ICD-10-CM | POA: Insufficient documentation

## 2014-10-08 DIAGNOSIS — E8989 Other postprocedural endocrine and metabolic complications and disorders: Secondary | ICD-10-CM | POA: Diagnosis not present

## 2014-10-08 DIAGNOSIS — M5489 Other dorsalgia: Secondary | ICD-10-CM | POA: Diagnosis present

## 2014-10-08 DIAGNOSIS — G893 Neoplasm related pain (acute) (chronic): Secondary | ICD-10-CM

## 2014-10-08 DIAGNOSIS — M7918 Myalgia, other site: Secondary | ICD-10-CM

## 2014-10-08 DIAGNOSIS — Z79899 Other long term (current) drug therapy: Secondary | ICD-10-CM

## 2014-10-08 DIAGNOSIS — Z5181 Encounter for therapeutic drug level monitoring: Secondary | ICD-10-CM

## 2014-10-08 MED ORDER — OXYCODONE HCL 5 MG PO TABS: 5.0000 mg | ORAL_TABLET | Freq: Four times a day (QID) | ORAL | Status: DC | PRN

## 2014-10-08 NOTE — Progress Notes (Signed)
Subjective:    Patient ID: Karen Hopkins, female    DOB: 02-Jun-1976, 39 y.o.   MRN: [id number]  HPI: [name] is a 39 year old female who returns for follow up for chronic pain and medication refill. She says her pain is located in her neck, right shoulder and lower back. She rates her pain 7. Her current exercise regime is walking attending physical therapy twice a week for dry needling she's obtaining good results.  She states she was diagnosed with DM her HGB A1C 6.8, she has seen the nutritionist and has a follow up appointment with her PCP.  One script discarded due to error entry.  Pain Inventory Average Pain 7 Pain Right Now 7 My pain is constant, sharp, burning, stabbing, tingling and aching  In the last 24 hours, has pain interfered with the following? General activity 6 Relation with others 6 Enjoyment of life 6 What TIME of day is your pain at its worst? ALL Sleep (in general) Poor  Pain is worse with: walking, bending, sitting, inactivity, standing and some activites Pain improves with: rest, therapy/exercise and medication Relief from Meds: 5  Mobility walk without assistance how many minutes can you walk? 10 ability to climb steps?  yes do you drive?  yes  Function disabled: date disabled .  Neuro/Psych numbness spasms confusion depression  Prior Studies Any changes since last visit?  no  Physicians involved in your care Any changes since last visit?  no   Family History  Problem Relation Age of Onset  . Hypertension Mother   . Diabetes type II Father   . Prostate cancer Father    History   Social History  . Marital Status: Single    Spouse Name: N/A  . Number of Children: N/A  . Years of Education: N/A   Social History Main Topics  . Smoking status: Former Smoker -- 0.25 packs/day for 5 years    Quit date: 04/26/2010  . Smokeless tobacco: Never Used  . Alcohol Use: No  . Drug Use: No  . Sexual Activity: Not Currently    Birth Control/ Protection: Surgical   Other Topics Concern  . None   Social History Narrative   Past Surgical History  Procedure Laterality Date  . Supra-umbilical hernia  6759  . Nasal septum surgery    . Latissimus flap to breast Right 03/12/2013    Procedure: RIGHT BREAST LATISSIMUS FLAP WITH EXPANDER PLACEMENT;  Surgeon: Theodoro Kos, DO;  Location: [facility];  Service: Plastics;  Laterality: Right;  . Cesarean section  07/15/2001; 03/18/2004; 11/21/2008  . Tubal ligation  11/21/2008  . Portacath placement Left 09/12/2009  . Modified radical mastectomy Right 03/11/2010  . Port-a-cath removal Left 12/01/2010  . Removal of tissue expander and placement of implant Right 08/23/2013    Procedure: REMOVAL RIGHT TISSUE EXPANDER AND PLACEMENT OF IMPLANT TO RIGHT BREAST ;  Surgeon: Theodoro Kos, DO;  Location: [facility];  Service: Plastics;  Laterality: Right;  . Breast reduction surgery Left 08/23/2013    Procedure: LEFT BREAST REDUCTION  ;  Surgeon: Theodoro Kos, DO;  Location: [facility];  Service: Plastics;  Laterality: Left;  . Liposuction Bilateral 08/23/2013    Procedure: LIPOSUCTION;  Surgeon: Theodoro Kos, DO;  Location: [facility];  Service: Plastics;  Laterality: Bilateral;   Past Medical History  Diagnosis Date  . Seasonal allergies   . Lymphedema of arm     right; no BP  or puncture to right arm  . Depression   . Anxiety   . Sinus headache   . Hypertension     under control with med., has been on med. x 2 yr.  . History of breast cancer 2011    right  . History of chemotherapy 2011  . History of radiation therapy 2011  . Cancer    BP 146/80 mmHg  Pulse 94  Resp 14  SpO2 97%  Opioid Risk Score:   Fall Risk Score: Low Fall Risk (0-5 points)`1  Depression screen PHQ 2/9  Depression screen [facility] 2/9 10/07/2014 10/01/2014 09/13/2014 09/12/2014  Decreased Interest 0 0 0 2  Down, Depressed, Hopeless 0 0 0 2  PHQ - 2 Score 0 0 0 4    Altered sleeping - - - 3  Tired, decreased energy - - - 3  Change in appetite - - - 3  Feeling bad or failure about yourself  - - - 3  Trouble concentrating - - - 3  Moving slowly or fidgety/restless - - - 2  Suicidal thoughts - - - 0  PHQ-9 Score - - - 21     Review of Systems  Constitutional: Positive for unexpected weight change.       High blood sugar, weight gain  HENT: Negative.   Eyes: Negative.   Respiratory: Negative.   Cardiovascular: Negative.   Gastrointestinal: Negative.   Endocrine: Negative.   Genitourinary: Positive for dyspareunia.  Musculoskeletal: Positive for back pain, arthralgias and neck pain.  Skin: Negative.   Allergic/Immunologic: Negative.   Neurological: Positive for numbness.       Spasm  Hematological: Negative.   Psychiatric/Behavioral: Positive for confusion and dysphoric mood.       Objective:   Physical Exam  Constitutional: She is oriented to [place], place, and time. She appears well-developed and well-nourished.  HENT:  Head: Normocephalic and atraumatic.  Neck: Normal range of motion. Neck supple.  Cervical Paraspinal Tenderness: C-3- C-5  Mainly Right Side  Cardiovascular: Normal rate and regular rhythm.   Pulmonary/Chest: Effort normal and breath sounds normal.  Musculoskeletal:  Normal Muscle Bulk and Muscle Testing Reveals: Upper Extremities: Right Decreased ROM 45 Degrees and Muscle Strength 4/5 Left: Full ROM and Muscle Strength 5/5 Thoracic Tightness: T-1- T-4 Lumbar Paraspinal Tenderness: L-3- L-5 Lower Extremities: Full ROM and Muscle Strength 5/5 Arises from chair with ease Narrow Based gait   Neurological: She is alert and oriented to [place], place, and time.  Skin: Skin is warm and dry.  Psychiatric: She has a normal mood and affect.  Nursing note and vitals reviewed.         Assessment & Plan:  1.Myofascial pain syndrome chronic postoperative, as well as post radiation: S/P Breast Reconstruction Surgery  x 2.  Refilled: Oxycodone 5 mg one tablet every 6 hours as needed for severe pain.#120 Continue with Exercise Regime and Physical Therapy for Dry Needling. 2. Obesity: Continue Healthy Diet and Exercise Routine.    20 minutes of face to face patient care time was spent during this visit. All questions were encouraged and answered

## 2014-10-08 NOTE — Therapy (Signed)
[place], [place], [address] Phone: [phone]   Fax:  [phone]  Physical Therapy Treatment  Patient Details  Name: Karen Hopkins MRN: [id number] Date of Birth: 1976-05-25 Referring Provider:  [name], MD  Encounter Date: 10/08/2014      PT End of Session - 10/08/14 1722    Visit Number 24   Number of Visits 28   Date for PT Re-Evaluation 10/22/14   PT Start Time 1350   PT Stop Time 1430   PT Time Calculation (min) 40 min   Activity Tolerance Patient tolerated treatment well   Behavior During Therapy [facility] for tasks assessed/performed      Past Medical History  Diagnosis Date  . Seasonal allergies   . Lymphedema of arm     right; no BP or puncture to right arm  . Depression   . Anxiety   . Sinus headache   . Hypertension     under control with med., has been on med. x 2 yr.  . History of breast cancer 2011    right  . History of chemotherapy 2011  . History of radiation therapy 2011  . Cancer     Past Surgical History  Procedure Laterality Date  . Supra-umbilical hernia  3300  . Nasal septum surgery    . Latissimus flap to breast Right 03/12/2013    Procedure: RIGHT BREAST LATISSIMUS FLAP WITH EXPANDER PLACEMENT;  Surgeon: Theodoro Kos, DO;  Location: [facility];  Service: Plastics;  Laterality: Right;  . Cesarean section  07/15/2001; 03/18/2004; 11/21/2008  . Tubal ligation  11/21/2008  . Portacath placement Left 09/12/2009  . Modified radical mastectomy Right 03/11/2010  . Port-a-cath removal Left 12/01/2010  . Removal of tissue expander and placement of implant Right 08/23/2013    Procedure: REMOVAL RIGHT TISSUE EXPANDER AND PLACEMENT OF IMPLANT TO RIGHT BREAST ;  Surgeon: Theodoro Kos, DO;  Location: [place];  Service: Plastics;  Laterality: Right;  . Breast reduction surgery Left 08/23/2013    Procedure: LEFT BREAST REDUCTION  ;  Surgeon: Theodoro Kos, DO;   Location: [facility];  Service: Plastics;  Laterality: Left;  . Liposuction Bilateral 08/23/2013    Procedure: LIPOSUCTION;  Surgeon: Theodoro Kos, DO;  Location: [facility];  Service: Plastics;  Laterality: Bilateral;    There were no vitals filed for this visit.  Visit Diagnosis:  Lymphedema of upper extremity following lymphadenectomy      Subjective Assessment - 10/08/14 1353    Subjective "They said I'm diabetic."  I went to a nutritionist yesterday, but she didn't talk about the diabetic side, she talked about the nutrition side.   Currently in Pain? Yes   Pain Score 8    Pain Location Shoulder   Pain Orientation Right   Pain Descriptors / Indicators Aching   Aggravating Factors  missing dry needling appointment due to pain clinic appointment   Pain Relieving Factors dry needling                         [facility] Adult PT Treatment/Exercise - 10/08/14 0001    Manual Therapy   Compression Bandaging Bandaging with TG soft, Elastomull to first four fingers (therapist starting this and instructing patient to do it, then patient finishing it); Artiflex x 2; 1-6 cm. bandage on hand, done first by patient with therapist reviewing technique with her, and then by therapist to  get the bandage on tighter; then 1-10 cm. and 2-12 cm. short stretch bandages wrist to shoulder.                PT Education - 10/08/14 1722    Education provided Yes   Education Details instructed in bandaging fingers and reviewed bandaging hand   Thatch(s) Educated Patient   Methods Explanation;Demonstration;Tactile cues;Verbal cues   Comprehension Need further instruction          PT Short Term Goals - 10/04/14 1141    PT SHORT TERM GOAL #1   Title Patient will report a 25% reduction in pain with usual daily activities and home chores.   Status Achieved   PT SHORT TERM GOAL #2   Title Patient will be able to resume a basic home ex program with minimal  increase in pain    Status Achieved   PT SHORT TERM GOAL #3   Title Right shoulder flexion improved to 92 degrees needed for reaching shoulder height shelves.   Status Achieved   PT SHORT TERM GOAL #4   Title Quick DASH score improved from 70.5 "severe difficulty" with ADLs level to 60 indicating improved function with less pain with grooming, household chores and sleep.     Status Achieved         Short Term Clinic Goals - 09/03/14 1217    CC Short Term Goal  #1   Title short term goals= long term goals          PT Long Term Goals - 10/04/14 1141    PT LONG TERM GOAL #1   Title Independent with progressive HEP for further ROM, strengthening of Right UE.   Time 14   Period Weeks   Status On-going   PT LONG TERM GOAL #2   Title Right shoulder flexion and abduction AROM improved to 105 degrees needed for reaching eye level shelves in the kitchen.     Time 14   Period Weeks   Status On-going   PT LONG TERM GOAL #3   Title Right UE strength grossly 4-/5 needed for basic cooking/cleaning for her 3 children.     Time 14   Period Weeks   Status On-going   PT LONG TERM GOAL #4   Title Quick DASH score improved to 50 indicating "moderate difficulty" with basic ADLS rather than "severe difficulty'"   Time 14   Period Weeks   Status On-going   PT LONG TERM GOAL #5   Title Patient will report overall improvement in pain level 3/10 level or better  and improve  function at 50%.   Time 14   Period Weeks   Status On-going   PT LONG TERM GOAL #6   Title Pt will be able to manage pain at night in order to sleep 4 or more hours of restorative sleep. Pt will verbalize 3 pain management strategies for sleep/rest   Time 14   Period Weeks   Status On-going           Long Term Clinic Goals - 09/26/14 1253    CC Long Term Goal  #4   Title right arm circumference at 15 cm provximal to olecranon will be reduced by 3 sm   Status Partially Met            Plan - 10/08/14 1723     Clinical Impression Statement Patient needed cueing for hand bandaging today and was a little hesitant to do some of this herself,  but did well with cueing and demonstration.     Pt will benefit from skilled therapeutic intervention in order to improve on the following deficits Impaired UE functional use;Increased fascial restricitons;Increased edema;Impaired flexibility;Postural dysfunction;Obesity;Decreased scar mobility;Decreased range of motion   Rehab Potential Good   Clinical Impairments Affecting Rehab Potential multiple medical and chronic pain issues   PT Frequency 2x / week   PT Duration 6 weeks   PT Treatment/Interventions ADLs/Self Care Home Management;Patient/family education;Compression bandaging   PT Next Visit Plan Continue educating patient on self-bandaging.   Consulted and Agree with Plan of Care Patient        Problem List Patient Active Problem List   Diagnosis Date Noted  . Type 2 diabetes mellitus without complication 71/85/5015  . Lower extremity edema 10/01/2014  . Diabetes mellitus type II, non insulin dependent 09/19/2014  . Breast cancer, right breast 05/24/2014  . Myalgia and myositis 05/17/2014  . Neoplasm related pain 03/12/2014  . CN (constipation) 11/26/2013  . H/O reduction mammoplasty 10/30/2013  . Seizure 10/06/2013  . Status post bilateral breast implants 08/31/2013  . Acquired absence of breast and absent nipple 08/23/2013  . S/P breast reconstruction, right 08/23/2013  . AC joint arthropathy 07/02/2013  . Routine adult health maintenance 05/29/2013  . Hypokalemia 05/21/2013  . Chronic pain 04/19/2013  . Elevated blood sugar 03/09/2013  . Wheezing 02/22/2013  . Acute bronchitis 02/22/2013  . Lymphedema 05/24/2011  . RHINOSINUSITIS, CHRONIC 04/08/2010  . Obesity 08/04/2006  . DEPRESSIVE DISORDER, NOS 08/04/2006  . RHINITIS, ALLERGIC 08/04/2006    Edlyn Rosenburg 10/08/2014, 5:26 PM  [place], [place], [address] Phone: [phone]   Fax:  [place], PT 10/08/2014 5:26 PM

## 2014-10-10 ENCOUNTER — Ambulatory Visit: Payer: Medicare Other | Admitting: Physical Therapy

## 2014-10-10 ENCOUNTER — Ambulatory Visit (HOSPITAL_COMMUNITY): Payer: Medicare Other

## 2014-10-10 DIAGNOSIS — E8989 Other postprocedural endocrine and metabolic complications and disorders: Secondary | ICD-10-CM | POA: Diagnosis not present

## 2014-10-10 DIAGNOSIS — M25611 Stiffness of right shoulder, not elsewhere classified: Secondary | ICD-10-CM

## 2014-10-10 DIAGNOSIS — M7918 Myalgia, other site: Secondary | ICD-10-CM

## 2014-10-10 DIAGNOSIS — M6281 Muscle weakness (generalized): Secondary | ICD-10-CM

## 2014-10-10 DIAGNOSIS — M62838 Other muscle spasm: Secondary | ICD-10-CM

## 2014-10-10 NOTE — Therapy (Signed)
[place], [place], [address] Phone: [phone]   Fax:  [phone]  Physical Therapy Treatment  Patient Details  Name: Karen Hopkins MRN: [id number] Date of Birth: April 29, 1976 Referring Provider:  [name], MD  Encounter Date: 10/10/2014      PT End of Session - 10/10/14 1745    Visit Number 25   Number of Visits 28   Date for PT Re-Evaluation 10/22/14   PT Start Time 1100   PT Stop Time 1145   PT Time Calculation (min) 45 min   Activity Tolerance Patient tolerated treatment well      Past Medical History  Diagnosis Date  . Seasonal allergies   . Lymphedema of arm     right; no BP or puncture to right arm  . Depression   . Anxiety   . Sinus headache   . Hypertension     under control with med., has been on med. x 2 yr.  . History of breast cancer 2011    right  . History of chemotherapy 2011  . History of radiation therapy 2011  . Cancer     Past Surgical History  Procedure Laterality Date  . Supra-umbilical hernia  8338  . Nasal septum surgery    . Latissimus flap to breast Right 03/12/2013    Procedure: RIGHT BREAST LATISSIMUS FLAP WITH EXPANDER PLACEMENT;  Surgeon: Theodoro Kos, DO;  Location: [facility];  Service: Plastics;  Laterality: Right;  . Cesarean section  07/15/2001; 03/18/2004; 11/21/2008  . Tubal ligation  11/21/2008  . Portacath placement Left 09/12/2009  . Modified radical mastectomy Right 03/11/2010  . Port-a-cath removal Left 12/01/2010  . Removal of tissue expander and placement of implant Right 08/23/2013    Procedure: REMOVAL RIGHT TISSUE EXPANDER AND PLACEMENT OF IMPLANT TO RIGHT BREAST ;  Surgeon: Theodoro Kos, DO;  Location: [facility];  Service: Plastics;  Laterality: Right;  . Breast reduction surgery Left 08/23/2013    Procedure: LEFT BREAST REDUCTION  ;  Surgeon: Theodoro Kos, DO;  Location: [facility];  Service: Plastics;   Laterality: Left;  . Liposuction Bilateral 08/23/2013    Procedure: LIPOSUCTION;  Surgeon: Theodoro Kos, DO;  Location: [facility];  Service: Plastics;  Laterality: Bilateral;    There were no vitals filed for this visit.  Visit Diagnosis:  Myofascial muscle pain  Shoulder joint stiffness, right  Muscle spasms of neck  Muscle weakness of right upper extremity      Subjective Assessment - 10/10/14 1102    Subjective Had an appointment at the pain clinic that conflicted last time;  reports upper trap muscle hardness;  no compression wrapping today;  states no improvement in sleep   Currently in Pain? Yes   Pain Score 5    Pain Location Shoulder   Pain Orientation Right   Aggravating Factors  missing dry needling earlier in the week   Pain Relieving Factors dry needling and deep tissue work            [facility] PT Assessment - 10/10/14 1119    ROM / Strength   AROM / PROM / Strength Strength   AROM   Right Shoulder Flexion 122 Degrees   Right Shoulder ABduction 115 Degrees   Right Shoulder Internal Rotation --  L5   Right Shoulder External Rotation 65 Degrees   Strength   Right Shoulder Flexion 3/5   Right Shoulder Extension 3/5  Right Shoulder ABduction 3/5   Right Shoulder Internal Rotation 3+/5   Right Shoulder External Rotation 3+/5                     [facility] Adult PT Treatment/Exercise - 10/10/14 1211    Shoulder Exercises: Standing   Other Standing Exercises Standing UE Ranger at level 12, 14,16   Manual Therapy   Myofascial Release upper trap, levator, cervical multifidi and suboccipital MFR in prone          Trigger Point Dry Needling - 10/10/14 1206    Consent Given? Yes   Muscles Treated Upper Body Upper trapezius;Oblique capitus;Suboccipitals muscle group;Levator scapulae   Upper Trapezius Response Twitch reponse elicited;Palpable increased muscle length   Oblique Capitus Response Twitch response elicited;Palpable increased  muscle length   SubOccipitals Response Palpable increased muscle length   Levator Scapulae Response Twitch response elicited;Palpable increased muscle length       Right side only.         PT Short Term Goals - 10/10/14 1750    PT SHORT TERM GOAL #1   Title Patient will report a 25% reduction in pain with usual daily activities and home chores.   Status Achieved   PT SHORT TERM GOAL #2   Title Patient will be able to resume a basic home ex program with minimal increase in pain    Status Achieved   PT SHORT TERM GOAL #3   Title Right shoulder flexion improved to 92 degrees needed for reaching shoulder height shelves.   Status Achieved   PT SHORT TERM GOAL #4   Title Quick DASH score improved from 70.5 "severe difficulty" with ADLs level to 60 indicating improved function with less pain with grooming, household chores and sleep.     Status Achieved         Short Term Clinic Goals - 09/03/14 1217    CC Short Term Goal  #1   Title short term goals= long term goals          PT Long Term Goals - 10/10/14 1751    PT LONG TERM GOAL #1   Title Independent with progressive HEP for further ROM, strengthening of Right UE.   Time 14   Period Weeks   Status On-going   PT LONG TERM GOAL #2   Title Right shoulder flexion and abduction AROM improved to 105 degrees needed for reaching eye level shelves in the kitchen.     Status Achieved   PT LONG TERM GOAL #3   Title Right UE strength grossly 4-/5 needed for basic cooking/cleaning for her 3 children.     Time 14   Period Weeks   Status Partially Met   PT LONG TERM GOAL #4   Title Quick DASH score improved to 50 indicating "moderate difficulty" with basic ADLS rather than "severe difficulty'"   Time 14   Period Weeks   Status On-going   PT LONG TERM GOAL #5   Title Patient will report overall improvement in pain level 3/10 level or better  and improve  function at 50%.   Time 14   Period Weeks   Status On-going   PT LONG  TERM GOAL #6   Title Pt will be able to manage pain at night in order to sleep 4 or more hours of restorative sleep. Pt will verbalize 3 pain management strategies for sleep/rest   Time 14   Period Weeks   Status On-going  Plan - 10/10/14 1745    Clinical Impression Statement Patient reports temporary relief with dry needling when nothing else has helped her myofascial pain.  Today with marked spasm and pain in right upper trap and levator scap.   She demonstrates good improvement with right shoulder AROM in flexion and abduction.  Strength improved as well.  Patient approaching max improvement with PT at this time.  Recommend 1 more visit to discuss comprehensive HEP and safe, self progression and dry needling/manual intervention.     PT Next Visit Plan Plan for discharge next visit.  G-Code;    Recheck Quick DASH; recheck cervical AROM;  dry needling and soft tisse mobilization; discuss/review HEP; check unmet goals        Problem List Patient Active Problem List   Diagnosis Date Noted  . Type 2 diabetes mellitus without complication 49/32/4199  . Lower extremity edema 10/01/2014  . Diabetes mellitus type II, non insulin dependent 09/19/2014  . Breast cancer, right breast 05/24/2014  . Myalgia and myositis 05/17/2014  . Neoplasm related pain 03/12/2014  . CN (constipation) 11/26/2013  . H/O reduction mammoplasty 10/30/2013  . Seizure 10/06/2013  . Status post bilateral breast implants 08/31/2013  . Acquired absence of breast and absent nipple 08/23/2013  . S/P breast reconstruction, right 08/23/2013  . AC joint arthropathy 07/02/2013  . Routine adult health maintenance 05/29/2013  . Hypokalemia 05/21/2013  . Chronic pain 04/19/2013  . Elevated blood sugar 03/09/2013  . Wheezing 02/22/2013  . Acute bronchitis 02/22/2013  . Lymphedema 05/24/2011  . RHINOSINUSITIS, CHRONIC 04/08/2010  . Obesity 08/04/2006  . DEPRESSIVE DISORDER, NOS 08/04/2006  . RHINITIS,  ALLERGIC 08/04/2006    Alvera Singh 10/10/2014, 5:54 PM  [facility] [address] [place], [place], [address] Phone: [phone]   Fax:  [phone]   Ruben Im, PT 10/10/2014 5:55 PM Phone: [phone] Fax: [phone]

## 2014-10-11 ENCOUNTER — Ambulatory Visit: Payer: Medicare Other | Admitting: Physical Therapy

## 2014-10-11 DIAGNOSIS — E8989 Other postprocedural endocrine and metabolic complications and disorders: Secondary | ICD-10-CM | POA: Diagnosis not present

## 2014-10-11 DIAGNOSIS — I890 Lymphedema, not elsewhere classified: Principal | ICD-10-CM

## 2014-10-11 NOTE — Therapy (Signed)
[place], [place], [address] Phone: [phone]   Fax:  [phone]  Physical Therapy Treatment  Patient Details  Name: Karen Hopkins MRN: [id number] Date of Birth: 05-06-1976 Referring Provider:  [name], MD  Encounter Date: 10/11/2014      PT End of Session - 10/11/14 1040    Visit Number 26   Number of Visits 28   Date for PT Re-Evaluation 10/22/14   PT Start Time 1030   PT Stop Time 1100   PT Time Calculation (min) 30 min      Past Medical History  Diagnosis Date  . Seasonal allergies   . Lymphedema of arm     right; no BP or puncture to right arm  . Depression   . Anxiety   . Sinus headache   . Hypertension     under control with med., has been on med. x 2 yr.  . History of breast cancer 2011    right  . History of chemotherapy 2011  . History of radiation therapy 2011  . Cancer     Past Surgical History  Procedure Laterality Date  . Supra-umbilical hernia  5790  . Nasal septum surgery    . Latissimus flap to breast Right 03/12/2013    Procedure: RIGHT BREAST LATISSIMUS FLAP WITH EXPANDER PLACEMENT;  Surgeon: Theodoro Kos, DO;  Location: [facility];  Service: Plastics;  Laterality: Right;  . Cesarean section  07/15/2001; 03/18/2004; 11/21/2008  . Tubal ligation  11/21/2008  . Portacath placement Left 09/12/2009  . Modified radical mastectomy Right 03/11/2010  . Port-a-cath removal Left 12/01/2010  . Removal of tissue expander and placement of implant Right 08/23/2013    Procedure: REMOVAL RIGHT TISSUE EXPANDER AND PLACEMENT OF IMPLANT TO RIGHT BREAST ;  Surgeon: Theodoro Kos, DO;  Location: [facility];  Service: Plastics;  Laterality: Right;  . Breast reduction surgery Left 08/23/2013    Procedure: LEFT BREAST REDUCTION  ;  Surgeon: Theodoro Kos, DO;  Location: [facility];  Service: Plastics;  Laterality: Left;  . Liposuction Bilateral 08/23/2013    Procedure: LIPOSUCTION;  Surgeon: Theodoro Kos, DO;  Location: [facility];  Service: Plastics;  Laterality: Bilateral;    There were no vitals filed for this visit.  Visit Diagnosis:  Lymphedema of upper extremity following lymphadenectomy      Subjective Assessment - 10/11/14 1032    Subjective pt comes in late for appointment as she said she fell back to sleep after the kids went to school  She went to the nurtionist and is able to tell me what she learned.  She is working on eating more vegetables. She takes the wrap off and put the sleeve on yesterday. She is still having trouble tolerating the nighttime sleeve because it is uncomfortable and it makes her fingers numb, but she thinks that may be coming from chemotherapy, She has not used the flexiitouch  because " she has been wrapped," She did not use the fleoitouch yesterday even though she took bandgages off yesterday morning    Pertinent History Right breast cancer with mastectomy 2011; reconstruction; left breast reduction; h/o right UE lymphedema and right upper quadrant pain, treated in this clinic several times before.  Recent scare with left breeast, but but ultrasound and mammogram were negative.   Currently in Pain? Yes  pain is tolerable.. its like a  5 or 6   Pain Score 6  Pain Location Shoulder            [facility] PT Assessment - 10/10/14 1119    ROM / Strength   AROM / PROM / Strength Strength   AROM   Right Shoulder Flexion 122 Degrees   Right Shoulder ABduction 115 Degrees   Right Shoulder Internal Rotation --  L5   Right Shoulder External Rotation 65 Degrees   Strength   Right Shoulder Flexion 3/5   Right Shoulder Extension 3/5   Right Shoulder ABduction 3/5   Right Shoulder Internal Rotation 3+/5   Right Shoulder External Rotation 3+/5                     [facility] Adult PT Treatment/Exercise - 10/10/14 1211    Shoulder Exercises: Standing   Other Standing Exercises Standing UE  Ranger at level 12, 14,16   Manual Therapy   Myofascial Release upper trap, levator, cervical multifidi and suboccipital MFR in prone          Trigger Point Dry Needling - 10/10/14 1206    Consent Given? Yes   Muscles Treated Upper Body Upper trapezius;Oblique capitus;Suboccipitals muscle group;Levator scapulae   Upper Trapezius Response Twitch reponse elicited;Palpable increased muscle length   Oblique Capitus Response Twitch response elicited;Palpable increased muscle length   SubOccipitals Response Palpable increased muscle length   Levator Scapulae Response Twitch response elicited;Palpable increased muscle length                PT Short Term Goals - 10/10/14 1750    PT SHORT TERM GOAL #1   Title Patient will report a 25% reduction in pain with usual daily activities and home chores.   Status Achieved   PT SHORT TERM GOAL #2   Title Patient will be able to resume a basic home ex program with minimal increase in pain    Status Achieved   PT SHORT TERM GOAL #3   Title Right shoulder flexion improved to 92 degrees needed for reaching shoulder height shelves.   Status Achieved   PT SHORT TERM GOAL #4   Title Quick DASH score improved from 70.5 "severe difficulty" with ADLs level to 60 indicating improved function with less pain with grooming, household chores and sleep.     Status Achieved         Short Term Clinic Goals - 09/03/14 1217    CC Short Term Goal  #1   Title short term goals= long term goals          PT Long Term Goals - 10/10/14 1751    PT LONG TERM GOAL #1   Title Independent with progressive HEP for further ROM, strengthening of Right UE.   Time 14   Period Weeks   Status On-going   PT LONG TERM GOAL #2   Title Right shoulder flexion and abduction AROM improved to 105 degrees needed for reaching eye level shelves in the kitchen.     Status Achieved   PT LONG TERM GOAL #3   Title Right UE strength grossly 4-/5 needed for basic  cooking/cleaning for her 3 children.     Time 14   Period Weeks   Status Partially Met   PT LONG TERM GOAL #4   Title Quick DASH score improved to 50 indicating "moderate difficulty" with basic ADLS rather than "severe difficulty'"   Time 14   Period Weeks   Status On-going   PT LONG TERM GOAL #5   Title Patient will  report overall improvement in pain level 3/10 level or better  and improve  function at 50%.   Time 14   Period Weeks   Status On-going   PT LONG TERM GOAL #6   Title Pt will be able to manage pain at night in order to sleep 4 or more hours of restorative sleep. Pt will verbalize 3 pain management strategies for sleep/rest   Time 14   Period Weeks   Status On-going           Long Term Clinic Goals - 10/11/14 1230    CC Long Term Goal  #4   Title right arm circumference at 15 cm provximal to olecranon will be reduced by 3 sm   Baseline 51.9   Status On-going            Plan - 10/11/14 1227    Clinical Impression Statement pt comes in 12 minutes late stating she just woke up. she has no bandages on her arm or evidence of wearing nightime sleeve. She says she has been trying to wrap her hand, but thinks she is wrapping it too tight. Pt says she is able to wear her daytime sleeve but it is not comfortable   PT Next Visit Plan Remeasure arm circumference and determine plan for discharge        Problem List Patient Active Problem List   Diagnosis Date Noted  . Type 2 diabetes mellitus without complication 91/79/2178  . Lower extremity edema 10/01/2014  . Diabetes mellitus type II, non insulin dependent 09/19/2014  . Breast cancer, right breast 05/24/2014  . Myalgia and myositis 05/17/2014  . Neoplasm related pain 03/12/2014  . CN (constipation) 11/26/2013  . H/O reduction mammoplasty 10/30/2013  . Seizure 10/06/2013  . Status post bilateral breast implants 08/31/2013  . Acquired absence of breast and absent nipple 08/23/2013  . S/P breast  reconstruction, right 08/23/2013  . AC joint arthropathy 07/02/2013  . Routine adult health maintenance 05/29/2013  . Hypokalemia 05/21/2013  . Chronic pain 04/19/2013  . Elevated blood sugar 03/09/2013  . Wheezing 02/22/2013  . Acute bronchitis 02/22/2013  . Lymphedema 05/24/2011  . RHINOSINUSITIS, CHRONIC 04/08/2010  . Obesity 08/04/2006  . DEPRESSIVE DISORDER, NOS 08/04/2006  . RHINITIS, ALLERGIC 08/04/2006   Donato Heinz. Owens Shark, PT  10/11/2014, 12:31 PM  [place], [place], [address] Phone: [phone]   Fax:  [phone]

## 2014-10-14 ENCOUNTER — Other Ambulatory Visit: Payer: Self-pay | Admitting: Family Medicine

## 2014-10-14 ENCOUNTER — Ambulatory Visit (HOSPITAL_COMMUNITY)
Admission: RE | Admit: 2014-10-14 | Discharge: 2014-10-14 | Disposition: A | Discharge status: Home / Self Care | Payer: Medicare Other | Source: Ambulatory Visit | Attending: Family Medicine | Admitting: Family Medicine

## 2014-10-14 DIAGNOSIS — R0601 Orthopnea: Secondary | ICD-10-CM

## 2014-10-14 NOTE — Progress Notes (Signed)
  Echocardiogram 2D Echocardiogram has been performed.  Darlina Sicilian M 10/14/2014, 2:48 PM

## 2014-10-15 ENCOUNTER — Ambulatory Visit: Payer: Medicare Other

## 2014-10-15 ENCOUNTER — Ambulatory Visit: Payer: Medicare Other | Admitting: Physical Therapy

## 2014-10-15 DIAGNOSIS — E8989 Other postprocedural endocrine and metabolic complications and disorders: Secondary | ICD-10-CM

## 2014-10-15 DIAGNOSIS — M6281 Muscle weakness (generalized): Secondary | ICD-10-CM

## 2014-10-15 DIAGNOSIS — M5489 Other dorsalgia: Secondary | ICD-10-CM

## 2014-10-15 DIAGNOSIS — I890 Lymphedema, not elsewhere classified: Principal | ICD-10-CM

## 2014-10-15 DIAGNOSIS — M7918 Myalgia, other site: Secondary | ICD-10-CM

## 2014-10-15 DIAGNOSIS — M542 Cervicalgia: Secondary | ICD-10-CM

## 2014-10-15 DIAGNOSIS — R293 Abnormal posture: Secondary | ICD-10-CM

## 2014-10-15 DIAGNOSIS — M62838 Other muscle spasm: Secondary | ICD-10-CM

## 2014-10-15 DIAGNOSIS — M25611 Stiffness of right shoulder, not elsewhere classified: Secondary | ICD-10-CM

## 2014-10-15 NOTE — Therapy (Signed)
[place], [place], [address] Phone: [phone]   Fax:  [phone]  Physical Therapy Treatment  Patient Details  Name: Karen Hopkins MRN: [id number] Date of Birth: January 15, 1976 Referring Provider:  Charlett Blake, MD  Encounter Date: 10/15/2014      PT End of Session - 10/15/14 1258    Visit Number 28   Number of Visits 28   Date for PT Re-Evaluation 10/22/14   PT Start Time 1200   PT Stop Time 1300   PT Time Calculation (min) 60 min   Activity Tolerance Patient tolerated treatment well   Behavior During Therapy [facility] for tasks assessed/performed      Past Medical History  Diagnosis Date  . Seasonal allergies   . Lymphedema of arm     right; no BP or puncture to right arm  . Depression   . Anxiety   . Sinus headache   . Hypertension     under control with med., has been on med. x 2 yr.  . History of breast cancer 2011    right  . History of chemotherapy 2011  . History of radiation therapy 2011  . Cancer     Past Surgical History  Procedure Laterality Date  . Supra-umbilical hernia  2671  . Nasal septum surgery    . Latissimus flap to breast Right 03/12/2013    Procedure: RIGHT BREAST LATISSIMUS FLAP WITH EXPANDER PLACEMENT;  Surgeon: Theodoro Kos, DO;  Location: [place];  Service: Plastics;  Laterality: Right;  . Cesarean section  07/15/2001; 03/18/2004; 11/21/2008  . Tubal ligation  11/21/2008  . Portacath placement Left 09/12/2009  . Modified radical mastectomy Right 03/11/2010  . Port-a-cath removal Left 12/01/2010  . Removal of tissue expander and placement of implant Right 08/23/2013    Procedure: REMOVAL RIGHT TISSUE EXPANDER AND PLACEMENT OF IMPLANT TO RIGHT BREAST ;  Surgeon: Theodoro Kos, DO;  Location: [place];  Service: Plastics;  Laterality: Right;  . Breast reduction surgery Left 08/23/2013    Procedure: LEFT BREAST REDUCTION  ;  Surgeon: Theodoro Kos, DO;   Location: [place];  Service: Plastics;  Laterality: Left;  . Liposuction Bilateral 08/23/2013    Procedure: LIPOSUCTION;  Surgeon: Theodoro Kos, DO;  Location: [place];  Service: Plastics;  Laterality: Bilateral;    There were no vitals filed for this visit.  Visit Diagnosis:  Myofascial muscle pain  Shoulder joint stiffness, right  Neck pain on right side  Muscle spasms of neck  Muscle weakness of right upper extremity  Abnormal posture  Stiffness of joint, shoulder region, right  Right-sided back pain, unspecified location      Subjective Assessment - 10/15/14 1254    Subjective I am doing so much better since I have been dry needling.  I definitely can turn my neck better than before I came.   Pertinent History Right breast cancer with mastectomy 2011; reconstruction; left breast reduction; h/o right UE lymphedema and right upper quadrant pain, treated in this clinic several times before.  Recent scare with left breeast, but but ultrasound and mammogram were negative.   Currently in Pain? Yes   Pain Score 6    Pain Location Shoulder   Pain Orientation Right   Pain Descriptors / Indicators Aching   Pain Type Chronic pain   Pain Onset More than a month ago   Pain Frequency Constant   Pain Relieving Factors dry needling  Pain Score 7  Pain 4/10   Pain Location Neck   Pain Orientation Right   Pain Descriptors / Indicators Aching;Sore   Pain Type Chronic pain   Pain Frequency Constant            [facility] PT Assessment - 10/15/14 1244    AROM   Cervical Flexion 45  cervical ROM post dry needling   Cervical Extension 50   Cervical - Right Side Bend 52   Cervical - Left Side Bend 45  End range tighness   Cervical - Right Rotation 56   Cervical - Left Rotation 60                     [facility] Adult PT Treatment/Exercise - 10/15/14 1239    Modalities   Modalities Moist Heat   Moist Heat Therapy   Number Minutes  Moist Heat 12 Minutes   Moist Heat Location --  right neck   Manual Therapy   Joint Mobilization Cervical P-A mob and lateral mobs grade 3 3x 20 sec C4-C7; also Thoracic PA mobs T-3 to T-8, superior clavicle mobs Grade 3/4 3 bouts of 30 sec   Myofascial Release suboccipital release 3x 20;SCM bending and MFR to rhomboids , upper traps, levator, SCM and scalene onRight MFR   Neck Exercises: Stretches   Upper Trapezius Stretch 2 reps;30 seconds   Levator Stretch 2 reps;30 seconds   Lower Cervical/Upper Thoracic Stretch 2 reps;30 seconds          Trigger Point Dry Needling - 10/15/14 1215    Muscles Treated Upper Body Upper trapezius;Levator scapulae;Oblique capitus;Suboccipitals muscle group  erector spinae C -3 to C-7 Right side only/R SCM andscalenes   Upper Trapezius Response Twitch reponse elicited;Palpable increased muscle length  right side only for dry needling all muscles   Oblique Capitus Response Twitch response elicited;Palpable increased muscle length   SubOccipitals Response Twitch response elicited;Palpable increased muscle length   Levator Scapulae Response Twitch response elicited;Palpable increased muscle length   Rhomboids Response Twitch response elicited;Palpable increased muscle length   Supraspinatus Response Twitch response elicited;Palpable increased muscle length   Infraspinatus Response Twitch response elicited;Palpable increased muscle length   Subscapularis Response Twitch response elicited;Palpable increased muscle length     Right side trigger point dry needling only         PT Education - 10/15/14 1257    Education provided Yes   Education Details Pt reviewed stretches for neck and pain management after discharge from orthopedics.    [place](s) Educated Patient   Methods Explanation;Demonstration   Comprehension Verbalized understanding;Returned demonstration          PT Short Term Goals - 10/15/14 1250    PT SHORT TERM GOAL #1   Title  Patient will report a 25% reduction in pain with usual daily activities and home chores.   Time 4   Period Weeks   Status Achieved   PT SHORT TERM GOAL #2   Title Patient will be able to resume a basic home ex program with minimal increase in pain    Time 4   Period Weeks   Status Achieved   PT SHORT TERM GOAL #3   Title Right shoulder flexion improved to 92 degrees needed for reaching shoulder height shelves.   Time 4   Period Weeks   Status Achieved   PT SHORT TERM GOAL #4   Title Quick DASH score improved from 70.5 "severe difficulty" with ADLs level to 60 indicating  improved function with less pain with grooming, household chores and sleep.     Time 4   Period Weeks   Status Achieved         Short Term Clinic Goals - 09/03/14 1217    CC Short Term Goal  #1   Title short term goals= long term goals          PT Long Term Goals - 10/15/14 1253    PT LONG TERM GOAL #1   Title Independent with progressive HEP for further ROM, strengthening of Right UE.   Time 14   Period Weeks   Status On-going   PT LONG TERM GOAL #2   Title Right shoulder flexion and abduction AROM improved to 105 degrees needed for reaching eye level shelves in the kitchen.     Time 14   Period Weeks   Status Achieved   PT LONG TERM GOAL #3   Title Right UE strength grossly 4-/5 needed for basic cooking/cleaning for her 3 children.     Time 14   Period Weeks   Status Partially Met   PT LONG TERM GOAL #4   Title Quick DASH score improved to 50 indicating "moderate difficulty" with basic ADLS rather than "severe difficulty'"   Time 14   Period Weeks   Status On-going   PT LONG TERM GOAL #5   Title Patient will report overall improvement in pain level 3/10 level or better  and improve  function at 50%.  4/10 level    Time 14   Period Weeks   Status Partially Met   PT LONG TERM GOAL #6   Title Pt will be able to manage pain at night in order to sleep 4 or more hours of restorative sleep. Pt  will verbalize 3 pain management strategies for sleep/rest   Time 14   Period Weeks   Status Achieved           Long Term Clinic Goals - 10/11/14 1230    CC Long Term Goal  #4   Title right arm circumference at 15 cm provximal to olecranon will be reduced by 3 sm   Baseline 51.9   Status On-going            Plan - 10/15/14 1313    Clinical Impression Statement Pt has completed course of treatment for orthopedic maximizing of pain management of Right neck spasm. Pt has increased AROM in all planes of cervical motion.  Pt has decreased pain from 8-10/10 to 4/10.  Pt is continuing PT for lymphedema but for now has maximized  goals for neck/ pain control.  Pt may in the future return if  spasms begin as she had at initiation of trigger point dry needling.  Pt will continue  strengthening AROM/lymphedmca management of Right arm   PT Next Visit Plan D/C from ortho, continue for lymphedema treatment   Consulted and Agree with Plan of Care Patient        Problem List Patient Active Problem List   Diagnosis Date Noted  . Type 2 diabetes mellitus without complication 97/41/6384  . Lower extremity edema 10/01/2014  . Diabetes mellitus type II, non insulin dependent 09/19/2014  . Breast cancer, right breast 05/24/2014  . Myalgia and myositis 05/17/2014  . Neoplasm related pain 03/12/2014  . CN (constipation) 11/26/2013  . H/O reduction mammoplasty 10/30/2013  . Seizure 10/06/2013  . Status post bilateral breast implants 08/31/2013  . Acquired absence of breast and absent nipple  08/23/2013  . S/P breast reconstruction, right 08/23/2013  . AC joint arthropathy 07/02/2013  . Routine adult health maintenance 05/29/2013  . Hypokalemia 05/21/2013  . Chronic pain 04/19/2013  . Elevated blood sugar 03/09/2013  . Wheezing 02/22/2013  . Acute bronchitis 02/22/2013  . Lymphedema 05/24/2011  . RHINOSINUSITIS, CHRONIC 04/08/2010  . Obesity 08/04/2006  . DEPRESSIVE DISORDER, NOS  08/04/2006  . RHINITIS, ALLERGIC 08/04/2006   Voncille Lo, PT 10/15/2014 1:20 PM Phone: [phone] Fax: [place] [facility] [address] [place], [place], [address] Phone: [phone]   Fax:  [phone]

## 2014-10-15 NOTE — Therapy (Signed)
[place], [place], [address] Phone: [phone]   Fax:  [phone]  Physical Therapy Treatment  Patient Details  Name: Karen Hopkins MRN: [id number] Date of Birth: Jul 24, 1975 Referring Provider:  [name], MD  Encounter Date: 10/15/2014      PT End of Session - 10/15/14 1151    Visit Number 27   Number of Visits 28   Date for PT Re-Evaluation 10/22/14   PT Start Time 1110   PT Stop Time 1148   PT Time Calculation (min) 38 min      Past Medical History  Diagnosis Date  . Seasonal allergies   . Lymphedema of arm     right; no BP or puncture to right arm  . Depression   . Anxiety   . Sinus headache   . Hypertension     under control with med., has been on med. x 2 yr.  . History of breast cancer 2011    right  . History of chemotherapy 2011  . History of radiation therapy 2011  . Cancer     Past Surgical History  Procedure Laterality Date  . Supra-umbilical hernia  5631  . Nasal septum surgery    . Latissimus flap to breast Right 03/12/2013    Procedure: RIGHT BREAST LATISSIMUS FLAP WITH EXPANDER PLACEMENT;  Surgeon: Theodoro Kos, DO;  Location: [place];  Service: Plastics;  Laterality: Right;  . Cesarean section  07/15/2001; 03/18/2004; 11/21/2008  . Tubal ligation  11/21/2008  . Portacath placement Left 09/12/2009  . Modified radical mastectomy Right 03/11/2010  . Port-a-cath removal Left 12/01/2010  . Removal of tissue expander and placement of implant Right 08/23/2013    Procedure: REMOVAL RIGHT TISSUE EXPANDER AND PLACEMENT OF IMPLANT TO RIGHT BREAST ;  Surgeon: Theodoro Kos, DO;  Location: [place];  Service: Plastics;  Laterality: Right;  . Breast reduction surgery Left 08/23/2013    Procedure: LEFT BREAST REDUCTION  ;  Surgeon: Theodoro Kos, DO;  Location: [place];  Service: Plastics;  Laterality: Left;  . Liposuction Bilateral 08/23/2013     Procedure: LIPOSUCTION;  Surgeon: Theodoro Kos, DO;  Location: [place];  Service: Plastics;  Laterality: Bilateral;    There were no vitals filed for this visit.  Visit Diagnosis:  Lymphedema of upper extremity following lymphadenectomy  Myofascial muscle pain  Shoulder joint stiffness, right  Muscle spasms of neck  Muscle weakness of right upper extremity  Stiffness of joint, shoulder region, right  Right-sided back pain, unspecified location  Abnormal posture      Subjective Assessment - 10/15/14 1116    Subjective Wore my sleeve yesterday when I took off the bandages and it still hurts! It digs into my upper arm, and then my arm starts to hurt.    Currently in Pain? No/denies                         [facility] Adult PT Treatment/Exercise - 10/15/14 0001    Manual Therapy   Manual Lymphatic Drainage (MLD) In Supine: Short neck, Lt axilla and Rt inguinal nodes, anterior inter-axillary and Rt axillo-inguinal anastomosis and Rt UE from dorsal hand to lateral shoulder.    Compression Bandaging Bandaging with TG soft, Elastomull to first four fingers; Artiflex x 2; 1-6 cm. bandage on hand, 3-12 cm. short stretch bandages wrist to shoulder.  PT Short Term Goals - 10/10/14 1750    PT SHORT TERM GOAL #1   Title Patient will report a 25% reduction in pain with usual daily activities and home chores.   Status Achieved   PT SHORT TERM GOAL #2   Title Patient will be able to resume a basic home ex program with minimal increase in pain    Status Achieved   PT SHORT TERM GOAL #3   Title Right shoulder flexion improved to 92 degrees needed for reaching shoulder height shelves.   Status Achieved   PT SHORT TERM GOAL #4   Title Quick DASH score improved from 70.5 "severe difficulty" with ADLs level to 60 indicating improved function with less pain with grooming, household chores and sleep.     Status Achieved          Short Term Clinic Goals - 09/03/14 1217    CC Short Term Goal  #1   Title short term goals= long term goals          PT Long Term Goals - 10/10/14 1751    PT LONG TERM GOAL #1   Title Independent with progressive HEP for further ROM, strengthening of Right UE.   Time 14   Period Weeks   Status On-going   PT LONG TERM GOAL #2   Title Right shoulder flexion and abduction AROM improved to 105 degrees needed for reaching eye level shelves in the kitchen.     Status Achieved   PT LONG TERM GOAL #3   Title Right UE strength grossly 4-/5 needed for basic cooking/cleaning for her 3 children.     Time 14   Period Weeks   Status Partially Met   PT LONG TERM GOAL #4   Title Quick DASH score improved to 50 indicating "moderate difficulty" with basic ADLS rather than "severe difficulty'"   Time 14   Period Weeks   Status On-going   PT LONG TERM GOAL #5   Title Patient will report overall improvement in pain level 3/10 level or better  and improve  function at 50%.   Time 14   Period Weeks   Status On-going   PT LONG TERM GOAL #6   Title Pt will be able to manage pain at night in order to sleep 4 or more hours of restorative sleep. Pt will verbalize 3 pain management strategies for sleep/rest   Time 14   Period Weeks   Status On-going           Long Term Clinic Goals - 10/11/14 1230    CC Long Term Goal  #4   Title right arm circumference at 15 cm provximal to olecranon will be reduced by 3 sm   Baseline 51.9   Status On-going            Plan - 10/15/14 1153    Clinical Impression Statement Overall can tell that pts upper arm is softer than when we started this episode of care. She will be ready for discharge next visit as she is feeling better about bandaging her hand and lower arm and will have her son help her with the last bandage for the upper arm which she is unable to wrap because of the circumference.   Pt will benefit from skilled therapeutic intervention in  order to improve on the following deficits Impaired UE functional use;Increased fascial restricitons;Increased edema;Impaired flexibility;Postural dysfunction;Obesity;Decreased scar mobility;Decreased range of motion   Rehab Potential Good   Clinical Impairments Affecting  Rehab Potential multiple medical and chronic pain issues   PT Frequency 2x / week   PT Duration 6 weeks   PT Treatment/Interventions ADLs/Self Care Home Management;Patient/family education;Compression bandaging   PT Next Visit Plan Remeasure arm circumference and plan to discharge. Spend treament having pt get comfortable with self bandaging.    Consulted and Agree with Plan of Care Patient        Problem List Patient Active Problem List   Diagnosis Date Noted  . Type 2 diabetes mellitus without complication 38/46/6599  . Lower extremity edema 10/01/2014  . Diabetes mellitus type II, non insulin dependent 09/19/2014  . Breast cancer, right breast 05/24/2014  . Myalgia and myositis 05/17/2014  . Neoplasm related pain 03/12/2014  . CN (constipation) 11/26/2013  . H/O reduction mammoplasty 10/30/2013  . Seizure 10/06/2013  . Status post bilateral breast implants 08/31/2013  . Acquired absence of breast and absent nipple 08/23/2013  . S/P breast reconstruction, right 08/23/2013  . AC joint arthropathy 07/02/2013  . Routine adult health maintenance 05/29/2013  . Hypokalemia 05/21/2013  . Chronic pain 04/19/2013  . Elevated blood sugar 03/09/2013  . Wheezing 02/22/2013  . Acute bronchitis 02/22/2013  . Lymphedema 05/24/2011  . RHINOSINUSITIS, CHRONIC 04/08/2010  . Obesity 08/04/2006  . DEPRESSIVE DISORDER, NOS 08/04/2006  . RHINITIS, ALLERGIC 08/04/2006    Otelia Limes, PTA 10/15/2014, 11:57 AM  [place], [place], [address] Phone: [phone]   Fax:  [phone]

## 2014-10-16 ENCOUNTER — Encounter: Payer: Self-pay | Admitting: Family Medicine

## 2014-10-17 ENCOUNTER — Ambulatory Visit: Payer: Medicare Other

## 2014-10-17 ENCOUNTER — Other Ambulatory Visit: Payer: Self-pay | Admitting: Oncology

## 2014-10-17 DIAGNOSIS — E8989 Other postprocedural endocrine and metabolic complications and disorders: Secondary | ICD-10-CM | POA: Diagnosis not present

## 2014-10-17 DIAGNOSIS — M7918 Myalgia, other site: Secondary | ICD-10-CM

## 2014-10-17 DIAGNOSIS — I890 Lymphedema, not elsewhere classified: Secondary | ICD-10-CM

## 2014-10-17 NOTE — Therapy (Signed)
[place], [place], [address] Phone: [phone]   Fax:  [phone]  Physical Therapy Treatment  Patient Details  Name: Karen Hopkins MRN: [id number] Date of Birth: 05-18-1976 Referring Provider:  [name], MD  Encounter Date: 10/17/2014      PT End of Session - 10/17/14 1230    Visit Number 29   Number of Visits 28   Date for PT Re-Evaluation 10/22/14   PT Start Time 1113   PT Stop Time 1203   PT Time Calculation (min) 50 min      Past Medical History  Diagnosis Date  . Seasonal allergies   . Lymphedema of arm     right; no BP or puncture to right arm  . Depression   . Anxiety   . Sinus headache   . Hypertension     under control with med., has been on med. x 2 yr.  . History of breast cancer 2011    right  . History of chemotherapy 2011  . History of radiation therapy 2011  . Cancer     Past Surgical History  Procedure Laterality Date  . Supra-umbilical hernia  2355  . Nasal septum surgery    . Latissimus flap to breast Right 03/12/2013    Procedure: RIGHT BREAST LATISSIMUS FLAP WITH EXPANDER PLACEMENT;  Surgeon: Theodoro Kos, DO;  Location: [facility];  Service: Plastics;  Laterality: Right;  . Cesarean section  07/15/2001; 03/18/2004; 11/21/2008  . Tubal ligation  11/21/2008  . Portacath placement Left 09/12/2009  . Modified radical mastectomy Right 03/11/2010  . Port-a-cath removal Left 12/01/2010  . Removal of tissue expander and placement of implant Right 08/23/2013    Procedure: REMOVAL RIGHT TISSUE EXPANDER AND PLACEMENT OF IMPLANT TO RIGHT BREAST ;  Surgeon: Theodoro Kos, DO;  Location: [facility];  Service: Plastics;  Laterality: Right;  . Breast reduction surgery Left 08/23/2013    Procedure: LEFT BREAST REDUCTION  ;  Surgeon: Theodoro Kos, DO;  Location: [facility];  Service: Plastics;  Laterality: Left;  . Liposuction Bilateral 08/23/2013     Procedure: LIPOSUCTION;  Surgeon: Theodoro Kos, DO;  Location: [facility];  Service: Plastics;  Laterality: Bilateral;    There were no vitals filed for this visit.  Visit Diagnosis:  Myofascial muscle pain  Lymphedema of upper extremity following lymphadenectomy      Subjective Assessment - 10/17/14 1113    Subjective Nothing new to report, feel like its staying about the same.    Currently in Pain? No/denies               LYMPHEDEMA/ONCOLOGY QUESTIONNAIRE - 10/17/14 1142    Right Upper Extremity Lymphedema   At Axilla  44.4 cm   15 cm Proximal to Olecranon Process 48.5 cm   10 cm Proximal to Olecranon Process 47.6 cm   Olecranon Process 33.5 cm   15 cm Proximal to Ulnar Styloid Process 31.4 cm   10 cm Proximal to Ulnar Styloid Process 27 cm   Just Proximal to Ulnar Styloid Process 17.9 cm   Across Hand at [employer] 21 cm   At [place] of 2nd Digit 6.1 cm                  [facility] Adult PT Treatment/Exercise - 10/17/14 0001    Manual Therapy   Manual Lymphatic Drainage (MLD) In Supine: Short neck, Lt axilla and Rt inguinal nodes,  anterior inter-axillary and Rt axillo-inguinal anastomosis and Rt UE from dorsal hand to lateral shoulder.    Compression Bandaging Bandaging with TG soft, Elastomull to first four fingers; Artiflex x 2 with 1/2"gray foam at posterior elbow; 1-6 cm. bandage on hand, 3-12 cm. short stretch bandages wrist to shoulder.                  PT Short Term Goals - 10/15/14 1250    PT SHORT TERM GOAL #1   Title Patient will report a 25% reduction in pain with usual daily activities and home chores.   Time 4   Period Weeks   Status Achieved   PT SHORT TERM GOAL #2   Title Patient will be able to resume a basic home ex program with minimal increase in pain    Time 4   Period Weeks   Status Achieved   PT SHORT TERM GOAL #3   Title Right shoulder flexion improved to 92 degrees needed for reaching shoulder  height shelves.   Time 4   Period Weeks   Status Achieved   PT SHORT TERM GOAL #4   Title Quick DASH score improved from 70.5 "severe difficulty" with ADLs level to 60 indicating improved function with less pain with grooming, household chores and sleep.     Time 4   Period Weeks   Status Achieved         Short Term Clinic Goals - 09/03/14 1217    CC Short Term Goal  #1   Title short term goals= long term goals          PT Long Term Goals - 10/15/14 1253    PT LONG TERM GOAL #1   Title Independent with progressive HEP for further ROM, strengthening of Right UE.   Time 14   Period Weeks   Status On-going   PT LONG TERM GOAL #2   Title Right shoulder flexion and abduction AROM improved to 105 degrees needed for reaching eye level shelves in the kitchen.     Time 14   Period Weeks   Status Achieved   PT LONG TERM GOAL #3   Title Right UE strength grossly 4-/5 needed for basic cooking/cleaning for her 3 children.     Time 14   Period Weeks   Status Partially Met   PT LONG TERM GOAL #4   Title Quick DASH score improved to 50 indicating "moderate difficulty" with basic ADLS rather than "severe difficulty'"   Time 14   Period Weeks   Status On-going   PT LONG TERM GOAL #5   Title Patient will report overall improvement in pain level 3/10 level or better  and improve  function at 50%.  4/10 level    Time 14   Period Weeks   Status Partially Met   PT LONG TERM GOAL #6   Title Pt will be able to manage pain at night in order to sleep 4 or more hours of restorative sleep. Pt will verbalize 3 pain management strategies for sleep/rest   Time 14   Period Weeks   Status Achieved           Long Term Clinic Goals - 10/17/14 1234    CC Long Term Goal  #3   Title Right arm circumference at 15 cm. proximal to olecranon will be reduced at least 0.5 cm.  2.5 cm reduction thus far 10/17/14   Status Achieved   CC Long Term Goal  #4  Title right arm circumference at 15 cm  provximal to olecranon will be reduced by 3 sm  2.5 cm reduction thus far 10/17/14   Status Partially Met            Plan - 10/17/14 1231    Clinical Impression Statement Pts circumference measurements were much improved at most locations except at elbow and 10 cm proximal to olecranon process. Pt has been wearing her compression sleeve/glove when the bandages come off.    Pt will benefit from skilled therapeutic intervention in order to improve on the following deficits Impaired UE functional use;Increased fascial restricitons;Increased edema;Impaired flexibility;Postural dysfunction;Obesity;Decreased scar mobility;Decreased range of motion   Rehab Potential Good   Clinical Impairments Affecting Rehab Potential multiple medical and chronic pain issues   PT Frequency 2x / week   PT Duration 6 weeks   PT Treatment/Interventions ADLs/Self Care Home Management;Patient/family education;Compression bandaging   PT Next Visit Plan Possible D/C next visit as pt seems to be maintaining well.   Consulted and Agree with Plan of Care Patient        Problem List Patient Active Problem List   Diagnosis Date Noted  . Type 2 diabetes mellitus without complication 12/26/5748  . Lower extremity edema 10/01/2014  . Diabetes mellitus type II, non insulin dependent 09/19/2014  . Breast cancer, right breast 05/24/2014  . Myalgia and myositis 05/17/2014  . Neoplasm related pain 03/12/2014  . CN (constipation) 11/26/2013  . H/O reduction mammoplasty 10/30/2013  . Seizure 10/06/2013  . Status post bilateral breast implants 08/31/2013  . Acquired absence of breast and absent nipple 08/23/2013  . S/P breast reconstruction, right 08/23/2013  . AC joint arthropathy 07/02/2013  . Routine adult health maintenance 05/29/2013  . Hypokalemia 05/21/2013  . Chronic pain 04/19/2013  . Elevated blood sugar 03/09/2013  . Wheezing 02/22/2013  . Acute bronchitis 02/22/2013  . Lymphedema 05/24/2011  .  RHINOSINUSITIS, CHRONIC 04/08/2010  . Obesity 08/04/2006  . DEPRESSIVE DISORDER, NOS 08/04/2006  . RHINITIS, ALLERGIC 08/04/2006    Otelia Limes, PTA 10/17/2014, 12:36 PM  [place], [place], [address] Phone: [phone]   Fax:  [phone]

## 2014-10-21 ENCOUNTER — Ambulatory Visit: Payer: Medicare Other

## 2014-10-22 ENCOUNTER — Ambulatory Visit: Payer: Medicare Other

## 2014-10-22 ENCOUNTER — Telehealth: Payer: Self-pay | Admitting: *Deleted

## 2014-10-22 NOTE — Telephone Encounter (Signed)
Called patient about missed appointment.  States that she overslept-has been really tired.  Rescheduled appt to 10/23/14 at 10:15 am

## 2014-10-23 ENCOUNTER — Ambulatory Visit (HOSPITAL_BASED_OUTPATIENT_CLINIC_OR_DEPARTMENT_OTHER): Payer: Medicare Other

## 2014-10-23 ENCOUNTER — Ambulatory Visit: Payer: Medicare Other | Admitting: Physical Therapy

## 2014-10-23 VITALS — BP 139/86 | HR 93 | Temp 98.1°F

## 2014-10-23 DIAGNOSIS — C50911 Malignant neoplasm of unspecified site of right female breast: Secondary | ICD-10-CM

## 2014-10-23 DIAGNOSIS — Z5111 Encounter for antineoplastic chemotherapy: Secondary | ICD-10-CM

## 2014-10-23 DIAGNOSIS — E8989 Other postprocedural endocrine and metabolic complications and disorders: Secondary | ICD-10-CM

## 2014-10-23 DIAGNOSIS — I890 Lymphedema, not elsewhere classified: Principal | ICD-10-CM

## 2014-10-23 MED ORDER — GOSERELIN ACETATE 3.6 MG ~~LOC~~ IMPL
3.6000 mg | DRUG_IMPLANT | Freq: Once | SUBCUTANEOUS | Status: AC
  Administered 2014-10-23: 3.6 mg via SUBCUTANEOUS
  Filled 2014-10-23: qty 3.6

## 2014-10-23 NOTE — Therapy (Signed)
[place], [place], [address] Phone: [phone]   Fax:  [phone]  Physical Therapy Treatment  Patient Details  Name: Karen Hopkins MRN: [id number] Date of Birth: 06/19/1975 Referring Provider:  [name], MD  Encounter Date: 10/23/2014      PT End of Session - 10/23/14 1146    Visit Number 30   PT Start Time 1104   PT Stop Time 1144   PT Time Calculation (min) 40 min   Activity Tolerance Patient tolerated treatment well   Behavior During Therapy [facility] for tasks assessed/performed      Past Medical History  Diagnosis Date  . Seasonal allergies   . Lymphedema of arm     right; no BP or puncture to right arm  . Depression   . Anxiety   . Sinus headache   . Hypertension     under control with med., has been on med. x 2 yr.  . History of breast cancer 2011    right  . History of chemotherapy 2011  . History of radiation therapy 2011  . Cancer     Past Surgical History  Procedure Laterality Date  . Supra-umbilical hernia  6387  . Nasal septum surgery    . Latissimus flap to breast Right 03/12/2013    Procedure: RIGHT BREAST LATISSIMUS FLAP WITH EXPANDER PLACEMENT;  Surgeon: Theodoro Kos, DO;  Location: [place];  Service: Plastics;  Laterality: Right;  . Cesarean section  07/15/2001; 03/18/2004; 11/21/2008  . Tubal ligation  11/21/2008  . Portacath placement Left 09/12/2009  . Modified radical mastectomy Right 03/11/2010  . Port-a-cath removal Left 12/01/2010  . Removal of tissue expander and placement of implant Right 08/23/2013    Procedure: REMOVAL RIGHT TISSUE EXPANDER AND PLACEMENT OF IMPLANT TO RIGHT BREAST ;  Surgeon: Theodoro Kos, DO;  Location: [facility];  Service: Plastics;  Laterality: Right;  . Breast reduction surgery Left 08/23/2013    Procedure: LEFT BREAST REDUCTION  ;  Surgeon: Theodoro Kos, DO;  Location: [place];  Service: Plastics;   Laterality: Left;  . Liposuction Bilateral 08/23/2013    Procedure: LIPOSUCTION;  Surgeon: Theodoro Kos, DO;  Location: [facility];  Service: Plastics;  Laterality: Bilateral;    There were no vitals filed for this visit.  Visit Diagnosis:  Lymphedema of upper extremity following lymphadenectomy      Subjective Assessment - 10/23/14 1105    Subjective "I'm good"  She has been discharge from dry needling session and is still doing her exercises.  She feels tlike the swelling is doing better. but she continues to have problems with her upper arm This is her "problem area"  She is continues to use the flexitouch when she is not wrapped.  She comes in today without a sleeve on.  She wore her Joneen Caraway sleeve about 4 hours and had to take if off  because  her hand goes numb She says the swelling in her her feet is much better.  She says the metformin has decreased her appettie  she is walking outside, and is trying to go to [employer]   Pertinent History Right breast cancer with mastectomy 2011; reconstruction; left breast reduction; h/o right UE lymphedema and right upper quadrant pain, treated in this clinic several times before.  Recent scare with left breeast, but but ultrasound and mammogram were negative.   Currently in Pain? Yes  "normal pain" not "complaining pain"  Pain Score 5    Pain Location Shoulder   Pain Orientation Right   Pain Descriptors / Indicators Aching;Sore   Pain Type Chronic pain   Pain Onset More than a month ago   Pain Frequency Constant               LYMPHEDEMA/ONCOLOGY QUESTIONNAIRE - 10/23/14 1115    Right Upper Extremity Lymphedema   At Axilla  42.8 cm   15 cm Proximal to Olecranon Process 48.5 cm   10 cm Proximal to Olecranon Process 46.6 cm   Olecranon Process 32 cm   15 cm Proximal to Ulnar Styloid Process 21.4 cm   10 cm Proximal to Ulnar Styloid Process 27.5 cm   Just Proximal to Ulnar Styloid Process 17.9 cm   Across Hand at  [employer] 21 cm   At [place] of 2nd Digit 6.2 cm                  [facility] Adult PT Treatment/Exercise - 10/23/14 0001    Manual Therapy   Compression Bandaging Issued new bandages as the ones pt brought in were not dry.  Aplied biotone to arm Bandaging with TG soft, Elastomull to first four fingers; Artiflex x 2 with 1/2"gray foam at posterior elbow; 1-6 cm. bandage on hand, 1-10, 2-12 cm. short stretch bandages wrist to shoulder. with one layer in herringbone  and upward direction of compression at upper arm                   PT Short Term Goals - 10/15/14 1250    PT SHORT TERM GOAL #1   Title Patient will report a 25% reduction in pain with usual daily activities and home chores.   Time 4   Period Weeks   Status Achieved   PT SHORT TERM GOAL #2   Title Patient will be able to resume a basic home ex program with minimal increase in pain    Time 4   Period Weeks   Status Achieved   PT SHORT TERM GOAL #3   Title Right shoulder flexion improved to 92 degrees needed for reaching shoulder height shelves.   Time 4   Period Weeks   Status Achieved   PT SHORT TERM GOAL #4   Title Quick DASH score improved from 70.5 "severe difficulty" with ADLs level to 60 indicating improved function with less pain with grooming, household chores and sleep.     Time 4   Period Weeks   Status Achieved         Short Term Clinic Goals - 09/03/14 1217    CC Short Term Goal  #1   Title short term goals= long term goals          PT Long Term Goals - 10/15/14 1253    PT LONG TERM GOAL #1   Title Independent with progressive HEP for further ROM, strengthening of Right UE.   Time 14   Period Weeks   Status On-going   PT LONG TERM GOAL #2   Title Right shoulder flexion and abduction AROM improved to 105 degrees needed for reaching eye level shelves in the kitchen.     Time 14   Period Weeks   Status Achieved   PT LONG TERM GOAL #3   Title Right UE strength grossly 4-/5  needed for basic cooking/cleaning for her 3 children.     Time 14   Period Weeks   Status Partially Met  PT LONG TERM GOAL #4   Title Quick DASH score improved to 50 indicating "moderate difficulty" with basic ADLS rather than "severe difficulty'"   Time 14   Period Weeks   Status On-going   PT LONG TERM GOAL #5   Title Patient will report overall improvement in pain level 3/10 level or better  and improve  function at 50%.  4/10 level    Time 14   Period Weeks   Status Partially Met   PT LONG TERM GOAL #6   Title Pt will be able to manage pain at night in order to sleep 4 or more hours of restorative sleep. Pt will verbalize 3 pain management strategies for sleep/rest   Time 14   Period Weeks   Status Achieved           Long Term Clinic Goals - 11/15/2014 1149    CC Long Term Goal  #3   Title Right arm circumference at 15 cm. proximal to olecranon will be reduced at least 0.5 cm.   Status Achieved   CC Long Term Goal  #4   Title right arm circumference at 15 cm provximal to olecranon will be reduced by 3 sm   Status Partially Met            Plan - 11-15-2014 1147    Clinical Impression Statement pt circumferences are stabalized, though goal not met for upper arm.  She has all home equipment that she needs, but continues to struggle with full compliance.  Encouraged pt to continue with exercise and diet and to do the best she can to manage this difficulty condidtion.    PT Next Visit Plan Discharge this episode           G-Codes - November 15, 2014 1150    Functional Assessment Tool Used clincial judgement   Functional Limitation Carrying, moving and handling objects   Carrying, Moving and Handling Objects Goal Status [phone]) At least 40 percent but less than 60 percent impaired, limited or restricted   Carrying, Moving and Handling Objects Discharge Status [phone]) At least 40 percent but less than 60 percent impaired, limited or restricted      Problem List Patient  Active Problem List   Diagnosis Date Noted  . Type 2 diabetes mellitus without complication 64/33/2951  . Lower extremity edema 10/01/2014  . Diabetes mellitus type II, non insulin dependent 09/19/2014  . Breast cancer, right breast 05/24/2014  . Myalgia and myositis 05/17/2014  . Neoplasm related pain 03/12/2014  . CN (constipation) 11/26/2013  . H/O reduction mammoplasty 10/30/2013  . Seizure 10/06/2013  . Status post bilateral breast implants 08/31/2013  . Acquired absence of breast and absent nipple 08/23/2013  . S/P breast reconstruction, right 08/23/2013  . AC joint arthropathy 07/02/2013  . Routine adult health maintenance 05/29/2013  . Hypokalemia 05/21/2013  . Chronic pain 04/19/2013  . Elevated blood sugar 03/09/2013  . Wheezing 02/22/2013  . Acute bronchitis 02/22/2013  . Lymphedema 05/24/2011  . RHINOSINUSITIS, CHRONIC 04/08/2010  . Obesity 08/04/2006  . DEPRESSIVE DISORDER, NOS 08/04/2006  . RHINITIS, ALLERGIC 08/04/2006     PHYSICAL THERAPY DISCHARGE SUMMARY  Visits from Start of Care: 30  Current functional level related to goals / functional outcomes: Pt has equipment at home for maintenance management of lymphedema   Remaining deficits: Right upper extremity lymphedema   Education / Equipment: Lymphedema home management  Plan: Patient agrees to discharge.  Patient goals were partially met. Patient is being discharged due  to lack of progress.  ?????        Donato Heinz. Owens Shark, PT  10/23/2014, 11:52 AM  [place], [place], [address] Phone: [phone]   Fax:  [phone]

## 2014-10-28 ENCOUNTER — Ambulatory Visit: Payer: Medicare Other

## 2014-10-30 ENCOUNTER — Ambulatory Visit: Payer: Medicare Other

## 2014-10-31 ENCOUNTER — Ambulatory Visit: Payer: Medicare Other | Admitting: Family Medicine

## 2014-11-01 ENCOUNTER — Other Ambulatory Visit: Payer: Self-pay | Admitting: Registered Nurse

## 2014-11-05 ENCOUNTER — Ambulatory Visit: Payer: Medicaid Other | Admitting: Dietician

## 2014-11-05 ENCOUNTER — Ambulatory Visit: Payer: Medicare Other | Admitting: Physical Therapy

## 2014-11-06 ENCOUNTER — Encounter: Payer: Medicare Other | Attending: Physical Medicine & Rehabilitation | Admitting: Registered Nurse

## 2014-11-06 ENCOUNTER — Encounter: Payer: Self-pay | Admitting: Registered Nurse

## 2014-11-06 VITALS — BP 134/82 | HR 86 | Resp 14 | Wt 273.0 lb

## 2014-11-06 DIAGNOSIS — M7918 Myalgia, other site: Secondary | ICD-10-CM

## 2014-11-06 DIAGNOSIS — G893 Neoplasm related pain (acute) (chronic): Secondary | ICD-10-CM

## 2014-11-06 DIAGNOSIS — Z79899 Other long term (current) drug therapy: Secondary | ICD-10-CM

## 2014-11-06 DIAGNOSIS — G894 Chronic pain syndrome: Secondary | ICD-10-CM | POA: Diagnosis not present

## 2014-11-06 DIAGNOSIS — I890 Lymphedema, not elsewhere classified: Secondary | ICD-10-CM | POA: Diagnosis not present

## 2014-11-06 DIAGNOSIS — Z5181 Encounter for therapeutic drug level monitoring: Secondary | ICD-10-CM

## 2014-11-06 DIAGNOSIS — M797 Fibromyalgia: Secondary | ICD-10-CM | POA: Diagnosis not present

## 2014-11-06 DIAGNOSIS — M546 Pain in thoracic spine: Secondary | ICD-10-CM | POA: Diagnosis present

## 2014-11-06 MED ORDER — OXYCODONE HCL 5 MG PO TABS: 5.0000 mg | ORAL_TABLET | Freq: Four times a day (QID) | ORAL | Status: DC | PRN

## 2014-11-06 NOTE — Progress Notes (Signed)
Subjective:    Patient ID: Karen Hopkins, female    DOB: 1976-03-01, 39 y.o.   MRN: [id number]  HPI: Karen Hopkins is a 39 year old female who returns for follow up for chronic pain and medication refill. She says her pain is located in her neck ( right side), right shoulder and mid- lower back. She rates her pain 7. Her current exercise regime is walking. Her Dry Needling has been stopped due to insurance. Hopefully she will be able to resume later in the year. She was given two scripts to accommodate her scheduled appointment, she verbalizes understanding.  Pain Inventory Average Pain 7 Pain Right Now 7 My pain is constant, sharp, burning, dull, stabbing, tingling and aching  In the last 24 hours, has pain interfered with the following? General activity 6 Relation with others 6 Enjoyment of life 6 What TIME of day is your pain at its worst? morning, evening, night Sleep (in general) Poor  Pain is worse with: walking, bending, sitting, inactivity and standing Pain improves with: therapy/exercise and medication Relief from Meds: 5  Mobility walk without assistance how many minutes can you walk? 10 ability to climb steps?  yes do you drive?  yes  Function disabled: date disabled . Do you have any goals in this area?  no  Neuro/Psych numbness spasms confusion depression anxiety  Prior Studies Any changes since last visit?  no  Physicians involved in your care Any changes since last visit?  no   Family History  Problem Relation Age of Onset  . Hypertension Mother   . Diabetes type II Father   . Prostate cancer Father    History   Social History  . Marital Status: Single    Spouse Name: N/A  . Number of Children: N/A  . Years of Education: N/A   Social History Main Topics  . Smoking status: Former Smoker -- 0.25 packs/day for 5 years    Quit date: 04/26/2010  . Smokeless tobacco: Never Used  . Alcohol Use: No  . Drug Use: No  . Sexual Activity:  Not Currently    Birth Control/ Protection: Surgical   Other Topics Concern  . None   Social History Narrative   Past Surgical History  Procedure Laterality Date  . Supra-umbilical hernia  5462  . Nasal septum surgery    . Latissimus flap to breast Right 03/12/2013    Procedure: RIGHT BREAST LATISSIMUS FLAP WITH EXPANDER PLACEMENT;  Surgeon: Theodoro Kos, DO;  Location: [facility];  Service: Plastics;  Laterality: Right;  . Cesarean section  07/15/2001; 03/18/2004; 11/21/2008  . Tubal ligation  11/21/2008  . Portacath placement Left 09/12/2009  . Modified radical mastectomy Right 03/11/2010  . Port-a-cath removal Left 12/01/2010  . Removal of tissue expander and placement of implant Right 08/23/2013    Procedure: REMOVAL RIGHT TISSUE EXPANDER AND PLACEMENT OF IMPLANT TO RIGHT BREAST ;  Surgeon: Theodoro Kos, DO;  Location: [place];  Service: Plastics;  Laterality: Right;  . Breast reduction surgery Left 08/23/2013    Procedure: LEFT BREAST REDUCTION  ;  Surgeon: Theodoro Kos, DO;  Location: [place];  Service: Plastics;  Laterality: Left;  . Liposuction Bilateral 08/23/2013    Procedure: LIPOSUCTION;  Surgeon: Theodoro Kos, DO;  Location: [facility];  Service: Plastics;  Laterality: Bilateral;   Past Medical History  Diagnosis Date  . Seasonal allergies   . Lymphedema of arm     right;  no BP or puncture to right arm  . Depression   . Anxiety   . Sinus headache   . Hypertension     under control with med., has been on med. x 2 yr.  . History of breast cancer 2011    right  . History of chemotherapy 2011  . History of radiation therapy 2011  . Cancer    BP 134/82 mmHg  Pulse 86  Resp 14  Wt 273 lb (123.832 kg)  SpO2 97%  Opioid Risk Score:   Fall Risk Score: Low Fall Risk (0-5 points)`1  Depression screen PHQ 2/9  Depression screen [facility] 2/9 10/07/2014 10/01/2014 09/13/2014 09/12/2014  Decreased Interest 0 0 0 2  Down, Depressed,  Hopeless 0 0 0 2  PHQ - 2 Score 0 0 0 4  Altered sleeping - - - 3  Tired, decreased energy - - - 3  Change in appetite - - - 3  Feeling bad or failure about yourself  - - - 3  Trouble concentrating - - - 3  Moving slowly or fidgety/restless - - - 2  Suicidal thoughts - - - 0  PHQ-9 Score - - - 21     Review of Systems  Constitutional:       Weight loss   Endocrine:       High blood sugar  Neurological: Positive for numbness.       Spasms  Psychiatric/Behavioral: Positive for confusion and dysphoric mood. The patient is nervous/anxious.   All other systems reviewed and are negative.      Objective:   Physical Exam  Constitutional: She is oriented to [place], place, and time. She appears well-developed and well-nourished.  HENT:  Head: Normocephalic and atraumatic.  Neck: Normal range of motion. Neck supple.  Cardiovascular: Normal rate and regular rhythm.   Pulmonary/Chest: Effort normal and breath sounds normal.  Musculoskeletal:  Normal Muscle Bulk and Muscle Testing Reveals: Upper Extremities: Full ROM and Muscle strength 5/5 Right AC Joint Tenderness Right Spine of Scapula Tenderness ( Rhomboid) Lower Extremities: Full ROM an Muscle Strength 5/5 Arises from chair with ease Narrow Based Gait  Neurological: She is alert and oriented to [place], place, and time.  Skin: Skin is warm and dry.  Psychiatric: She has a normal mood and affect.  Nursing note and vitals reviewed.         Assessment & Plan:  1.Myofascial pain syndrome chronic postoperative, as well as post radiation: S/P Breast Reconstruction Surgery x 2.  Refilled: Oxycodone 5 mg one tablet every 6 hours as needed for severe pain.#120. Second script given to accommodate appointment.Continue with Exercise Regime. 2. Obesity: Losing weight continue with Healthy Diet and Exercise Regime.    20 minutes of face to face patient care time was spent during this visit. All questions were encouraged and  answered

## 2014-11-16 ENCOUNTER — Other Ambulatory Visit: Payer: Self-pay | Admitting: Oncology

## 2014-11-18 ENCOUNTER — Other Ambulatory Visit: Payer: Self-pay | Admitting: Oncology

## 2014-11-18 ENCOUNTER — Other Ambulatory Visit: Payer: Self-pay | Admitting: *Deleted

## 2014-11-18 MED ORDER — ALPRAZOLAM 1 MG PO TABS: 1.0000 mg | ORAL_TABLET | Freq: Two times a day (BID) | ORAL | Status: DC | PRN

## 2014-11-19 ENCOUNTER — Ambulatory Visit (HOSPITAL_BASED_OUTPATIENT_CLINIC_OR_DEPARTMENT_OTHER): Payer: Medicare Other

## 2014-11-19 VITALS — BP 115/87 | HR 97 | Temp 98.2°F

## 2014-11-19 DIAGNOSIS — C50911 Malignant neoplasm of unspecified site of right female breast: Secondary | ICD-10-CM | POA: Diagnosis not present

## 2014-11-19 DIAGNOSIS — Z5111 Encounter for antineoplastic chemotherapy: Secondary | ICD-10-CM

## 2014-11-19 MED ORDER — GOSERELIN ACETATE 3.6 MG ~~LOC~~ IMPL
3.6000 mg | DRUG_IMPLANT | Freq: Once | SUBCUTANEOUS | Status: AC
  Administered 2014-11-19: 3.6 mg via SUBCUTANEOUS
  Filled 2014-11-19: qty 3.6

## 2014-11-21 ENCOUNTER — Encounter: Payer: Self-pay | Admitting: Family Medicine

## 2014-11-21 ENCOUNTER — Ambulatory Visit (INDEPENDENT_AMBULATORY_CARE_PROVIDER_SITE_OTHER): Payer: Medicare Other | Admitting: Family Medicine

## 2014-11-21 VITALS — BP 132/88 | HR 109 | Temp 98.4°F | Ht 64.0 in | Wt 275.0 lb

## 2014-11-21 DIAGNOSIS — E669 Obesity, unspecified: Secondary | ICD-10-CM | POA: Diagnosis present

## 2014-11-21 DIAGNOSIS — R600 Localized edema: Secondary | ICD-10-CM

## 2014-11-21 DIAGNOSIS — E119 Type 2 diabetes mellitus without complications: Secondary | ICD-10-CM | POA: Diagnosis not present

## 2014-11-21 MED ORDER — BLOOD GLUCOSE MONITOR KIT: PACK | Status: DC

## 2014-11-21 NOTE — Patient Instructions (Addendum)
Get compression hose See exercise handout Exercise will help lots of things: diabetes, blood pressure, weight, mood, energy level Follow up in 6-8 weeks. We will recheck A1c at that visit.  Be well, Dr. Ardelia Mems     Exercise to Lose Weight Exercise and a healthy diet may help you lose weight. Your doctor may suggest specific exercises. EXERCISE IDEAS AND TIPS  Choose low-cost things you enjoy doing, such as walking, bicycling, or exercising to workout videos.  Take stairs instead of the elevator.  Walk during your lunch break.  Park your car further away from work or school.  Go to a gym or an exercise class.  Start with 5 to 10 minutes of exercise each day. Build up to 30 minutes of exercise 4 to 6 days a week.  Wear shoes with good support and comfortable clothes.  Stretch before and after working out.  Work out until you breathe harder and your heart beats faster.  Drink extra water when you exercise.  Do not do so much that you hurt yourself, feel dizzy, or get very short of breath. Exercises that burn about 150 calories:  Running 1  miles in 15 minutes.  Playing volleyball for 45 to 60 minutes.  Washing and waxing a car for 45 to 60 minutes.  Playing touch football for 45 minutes.  Walking 1  miles in 35 minutes.  Pushing a stroller 1  miles in 30 minutes.  Playing basketball for 30 minutes.  Raking leaves for 30 minutes.  Bicycling 5 miles in 30 minutes.  Walking 2 miles in 30 minutes.  Dancing for 30 minutes.  Shoveling snow for 15 minutes.  Swimming laps for 20 minutes.  Walking up stairs for 15 minutes.  Bicycling 4 miles in 15 minutes.  Gardening for 30 to 45 minutes.  Jumping rope for 15 minutes.  Washing windows or floors for 45 to 60 minutes. Document Released: 06/26/2010 Document Revised: 08/16/2011 Document Reviewed: 06/26/2010 [facility] Patient Information 2015 [place], [place]. This information is not intended to replace  advice given to you by your health care provider. Make sure you discuss any questions you have with your health care provider.

## 2014-11-24 NOTE — Assessment & Plan Note (Addendum)
Tolerating metformin. Has upcoming DM nutritionist appt. Plan to recheck A1c in about 2 mos. Will send in rx for meter, lancets, and strips per pt request.

## 2014-11-24 NOTE — Assessment & Plan Note (Signed)
Echo was unremarkable. Encouraged compression hose with goal of stopping lasix in near future.

## 2014-11-24 NOTE — Assessment & Plan Note (Addendum)
A lot of her fatigue issues are likely related to weight and inactivity. Discussed importance of exercise. Explained healthy ways to start at the gym. Gave handout on exercise as well. F/u 2 mos

## 2014-11-24 NOTE — Progress Notes (Signed)
Patient ID: Karen Hopkins, female   DOB: 06-Nov-1975, 39 y.o.   MRN: [id number]  HPI:  F/u dyspnea: dyspnea improved. Echo was unremarkable (no signs CHF). Pt is chronic upright sleeper but does not have new orthopnea symptoms. Does feel quite tired and has low energy and mood. Being treated with xanax and celexa by her oncologist for depression. Has rx for compression hose but hasn't gotten those yet.   DM: tolerating metformin. Has upcoming diabetic nutrition visit. Not due yet for A1c.  ROS: See HPI.  [place]: hx obesity, depression, HTN, breast cancer  PHYSICAL EXAM: BP 132/88 mmHg  Pulse 109  Temp(Src) 98.4 F (36.9 C) (Oral)  Ht 5\' 4"  (1.626 m)  Wt 275 lb (124.739 kg)  BMI 47.18 kg/m2 Gen: NAD HEENT: NCAT Heart: RRR no murmur Lungs: CTAB NWOB Neuro: grossly nonfocal speech normal Ext: 1+ pitting edema bilat lower ext Psych: normal range of affect, well groomed, speech normal in rate and volume, normal eye contact   ASSESSMENT/PLAN:  Obesity A lot of her fatigue issues are likely related to weight and inactivity. Discussed importance of exercise. Explained healthy ways to start at the gym. Gave handout on exercise as well. F/u 2 mos  Type 2 diabetes mellitus without complication Tolerating metformin. Has upcoming DM nutritionist appt. Plan to recheck A1c in about 2 mos. Will send in rx for meter, lancets, and strips per pt request.  Lower extremity edema Echo was unremarkable. Encouraged compression hose with goal of stopping lasix in near future.   FOLLOW UP: F/u in 6-8 weeks for above issues  [place] J. Ardelia Mems, [place]

## 2014-11-25 ENCOUNTER — Encounter: Payer: Self-pay | Admitting: Dietician

## 2014-11-25 ENCOUNTER — Encounter: Payer: Medicare Other | Attending: Family Medicine | Admitting: Dietician

## 2014-11-25 VITALS — Ht 64.0 in | Wt 275.4 lb

## 2014-11-25 DIAGNOSIS — E119 Type 2 diabetes mellitus without complications: Secondary | ICD-10-CM | POA: Diagnosis not present

## 2014-11-25 DIAGNOSIS — Z713 Dietary counseling and surveillance: Secondary | ICD-10-CM | POA: Diagnosis not present

## 2014-11-25 DIAGNOSIS — E669 Obesity, unspecified: Secondary | ICD-10-CM

## 2014-11-25 NOTE — Progress Notes (Signed)
  Medical Nutrition Therapy:  Appt start time: 2671 end time:  1210.   Assessment:  Primary concerns today: Karen Hopkins is referred here for weight gain/obesity and returns with an 6 lb loss.  Will have another Hgb A1c done in a month. Still taking just Metformin. Asked doctor if she could test her blood sugar and will test every once in a while.   Still not feeling very hungry, though trying to have 3 meals per day. Added more vegetables.   Still not sleeping well.  Preferred Learning Style:   No preference indicated   Learning Readiness:   Ready  MEDICATIONS: see list    DIETARY INTAKE:  Usual eating pattern includes 2 meals and 1 snacks per day.  Avoided foods include: none    24-hr recall:  B ( AM): [place] shake or 2 eggs with toast  Snk ( AM): pecans and fruit L ( PM): sandwich (1/ [place] hoagie) Snk ( PM): none D ( PM): baked chicken with 1/2 plate of vegetable and sometimes a carb  Snk ( PM): none Beverages: water sometimes with aloe or lemons  Usual physical activity: 2 x week walking and treadmill/bike (about 40 minutes total)  Estimated energy needs: 1800 calories 200 g carbohydrates 135 g protein 50 g fat  Progress Towards Goal(s):  In progress.   Nutritional Diagnosis:  [place]-2.1 Inpaired nutrition utilization As related to glucose metabolism.  As evidenced by Hgb A1c of 6.8%.    Intervention:  Nutrition counseling provided. Plan: Aim to eat 3 meals per day and 2 snacks if you are hungry. Continue working on getting good sleep. Avoid electronics in the hour before bed. Try to go to bed at the same time. Continue to exercise at [facility] for 2 x week for 60 minutes. Increase when you can. (Sleep is better with exercise!) Continue to fill half of your plate with vegetables for lunch and dinner. Try to make your own with ranch packet and plain non fat greek yogurt.  Try to take 20 minutes to eat meals. Try a [facility] [employer] or low fat  cheese/peanut butter/lunch meat with 6 crackers for a snack.    Teaching Method Utilized:  Visual Auditory Hands on   Barriers to learning/adherence to lifestyle change: hx of several chronic health conditions   Demonstrated degree of understanding via:  Teach Back   Monitoring/Evaluation:  Dietary intake, exercise, and body weight in 6 week(s).

## 2014-11-25 NOTE — Patient Instructions (Addendum)
Aim to eat 3 meals per day and 2 snacks if you are hungry. Continue working on getting good sleep. Avoid electronics in the hour before bed. Try to go to bed at the same time. Continue to exercise at [facility] for 2 x week for 60 minutes. Increase when you can. (Sleep is better with exercise!) Continue to fill half of your plate with vegetables for lunch and dinner. Try to make your own with ranch packet and plain non fat greek yogurt.  Try to take 20 minutes to eat meals. Try a [facility] [employer] or low fat cheese/peanut butter/lunch meat with 6 crackers for a snack.    Great job!

## 2014-12-17 ENCOUNTER — Ambulatory Visit (HOSPITAL_BASED_OUTPATIENT_CLINIC_OR_DEPARTMENT_OTHER): Payer: Medicare Other

## 2014-12-17 ENCOUNTER — Other Ambulatory Visit: Payer: Self-pay | Admitting: Family Medicine

## 2014-12-17 ENCOUNTER — Encounter: Payer: Self-pay | Admitting: Genetic Counselor

## 2014-12-17 VITALS — BP 134/88 | HR 103 | Temp 98.8°F | Wt 272.4 lb

## 2014-12-17 DIAGNOSIS — Z5111 Encounter for antineoplastic chemotherapy: Secondary | ICD-10-CM

## 2014-12-17 DIAGNOSIS — C50911 Malignant neoplasm of unspecified site of right female breast: Secondary | ICD-10-CM | POA: Diagnosis not present

## 2014-12-17 MED ORDER — GOSERELIN ACETATE 3.6 MG ~~LOC~~ IMPL
3.6000 mg | DRUG_IMPLANT | Freq: Once | SUBCUTANEOUS | Status: AC
Start: 2014-12-17 — End: 2014-12-17
  Administered 2014-12-17: 3.6 mg via SUBCUTANEOUS
  Filled 2014-12-17: qty 3.6

## 2014-12-18 ENCOUNTER — Encounter: Payer: Medicare Other | Attending: Registered Nurse | Admitting: Registered Nurse

## 2014-12-18 ENCOUNTER — Encounter: Payer: Self-pay | Admitting: Registered Nurse

## 2014-12-18 VITALS — BP 133/81 | HR 99 | Resp 14

## 2014-12-18 DIAGNOSIS — G894 Chronic pain syndrome: Secondary | ICD-10-CM

## 2014-12-18 DIAGNOSIS — G893 Neoplasm related pain (acute) (chronic): Secondary | ICD-10-CM | POA: Diagnosis not present

## 2014-12-18 DIAGNOSIS — M7918 Myalgia, other site: Secondary | ICD-10-CM

## 2014-12-18 DIAGNOSIS — I890 Lymphedema, not elsewhere classified: Secondary | ICD-10-CM | POA: Diagnosis not present

## 2014-12-18 DIAGNOSIS — M546 Pain in thoracic spine: Secondary | ICD-10-CM | POA: Insufficient documentation

## 2014-12-18 DIAGNOSIS — Z79899 Other long term (current) drug therapy: Secondary | ICD-10-CM

## 2014-12-18 DIAGNOSIS — Z5181 Encounter for therapeutic drug level monitoring: Secondary | ICD-10-CM

## 2014-12-18 DIAGNOSIS — M797 Fibromyalgia: Secondary | ICD-10-CM | POA: Diagnosis not present

## 2014-12-18 MED ORDER — OXYCODONE HCL 5 MG PO TABS: 5.0000 mg | ORAL_TABLET | Freq: Four times a day (QID) | ORAL | Status: DC | PRN

## 2014-12-18 NOTE — Progress Notes (Signed)
Subjective:    Patient ID: Karen Hopkins, female    DOB: 06-01-1976, 39 y.o.   MRN: [id number]  HPI: [name] is a 39 year old female who returns for follow up for chronic pain and medication refill. She says her pain is located in her right shoulder.She rates her pain 7. Her current exercise regime is walking, performing stretching exercises and attending Karen Hopkins twice a week. She states she will be calling Physical Therapy to see if she qualifies for Dry Needling again.  Pain Inventory Average Pain 8 Pain Right Now 7 My pain is sharp, dull, stabbing, tingling and aching  In the last 24 hours, has pain interfered with the following? General activity 7 Relation with others 6 Enjoyment of life 6 What TIME of day is your pain at its worst? all Sleep (in general) Poor  Pain is worse with: walking, bending, sitting, inactivity, standing and some activites Pain improves with: rest, therapy/exercise, medication and injections Relief from Meds: 6  Mobility walk without assistance how many minutes can you walk? 10 ability to climb steps?  yes do you drive?  yes Do you have any goals in this area?  yes  Function disabled: date disabled . Do you have any goals in this area?  no  Neuro/Psych numbness tremor tingling spasms dizziness confusion depression anxiety  Prior Studies Any changes since last visit?  no  Physicians involved in your care Any changes since last visit?  no   Family History  Problem Relation Age of Onset  . Hypertension Mother   . Diabetes type II Father   . Prostate cancer Father    History   Social History  . Marital Status: Single    Spouse Name: N/A  . Number of Children: N/A  . Years of Education: N/A   Social History Main Topics  . Smoking status: Former Smoker -- 0.25 packs/day for 5 years    Quit date: 04/26/2010  . Smokeless tobacco: Never Used  . Alcohol Use: No  . Drug Use: No  . Sexual Activity: Not  Currently    Birth Control/ Protection: Surgical   Other Topics Concern  . None   Social History Narrative   Past Surgical History  Procedure Laterality Date  . Supra-umbilical hernia  0938  . Nasal septum surgery    . Latissimus flap to breast Right 03/12/2013    Procedure: RIGHT BREAST LATISSIMUS FLAP WITH EXPANDER PLACEMENT;  Surgeon: Theodoro Kos, DO;  Location: [place];  Service: Plastics;  Laterality: Right;  . Cesarean section  07/15/2001; 03/18/2004; 11/21/2008  . Tubal ligation  11/21/2008  . Portacath placement Left 09/12/2009  . Modified radical mastectomy Right 03/11/2010  . Port-a-cath removal Left 12/01/2010  . Removal of tissue expander and placement of implant Right 08/23/2013    Procedure: REMOVAL RIGHT TISSUE EXPANDER AND PLACEMENT OF IMPLANT TO RIGHT BREAST ;  Surgeon: Theodoro Kos, DO;  Location: [place];  Service: Plastics;  Laterality: Right;  . Breast reduction surgery Left 08/23/2013    Procedure: LEFT BREAST REDUCTION  ;  Surgeon: Theodoro Kos, DO;  Location: [place];  Service: Plastics;  Laterality: Left;  . Liposuction Bilateral 08/23/2013    Procedure: LIPOSUCTION;  Surgeon: Theodoro Kos, DO;  Location: [place];  Service: Plastics;  Laterality: Bilateral;   Past Medical History  Diagnosis Date  . Seasonal allergies   . Lymphedema of arm     right; no BP  or puncture to right arm  . Depression   . Anxiety   . Sinus headache   . Hypertension     under control with med., has been on med. x 2 yr.  . History of breast cancer 2011    right  . History of chemotherapy 2011  . History of radiation therapy 2011  . Cancer    BP 133/81 mmHg  Pulse 99  Resp 14  SpO2 94%  Opioid Risk Score:   Fall Risk Score: Low Fall Risk (0-5 points)`1  Depression screen PHQ 2/9  Depression screen [facility] 2/9 11/21/2014 10/07/2014 10/01/2014 09/13/2014 09/12/2014  Decreased Interest 0 0 0 0 2  Down, Depressed, Hopeless 0 0 0 0 2    PHQ - 2 Score 0 0 0 0 4  Altered sleeping - - - - 3  Tired, decreased energy - - - - 3  Change in appetite - - - - 3  Feeling bad or failure about yourself  - - - - 3  Trouble concentrating - - - - 3  Moving slowly or fidgety/restless - - - - 2  Suicidal thoughts - - - - 0  PHQ-9 Score - - - - 21    Review of Systems  Constitutional: Positive for unexpected weight change.       Night sweats  Endocrine:       High blood sugar  Neurological: Positive for dizziness, tremors and numbness.       Tingling Spasms   Psychiatric/Behavioral: Positive for confusion and dysphoric mood. The patient is nervous/anxious.   All other systems reviewed and are negative.      Objective:   Physical Exam  Constitutional: She is oriented to [place], place, and time. She appears well-developed and well-nourished.  HENT:  Head: Normocephalic and atraumatic.  Neck: Normal range of motion. Neck supple.  Cardiovascular: Normal rate and regular rhythm.   Pulmonary/Chest: Effort normal and breath sounds normal.  Musculoskeletal:  Normal Muscle Bulk and Muscle Testing Reveals: Upper Extremities:Left: Full ROM and Muscle Strength 5/5 Right: Decreased ROM 45 Degrees and Muscle Strength 5/5 Right AC Joint Tenderness Lumbar Paraspinal Tenderness: L-3- L-5 Lower Extremities: Full ROM and Muscle Strength 5/5 Arise from chair with ease Narrow Based Gait   Neurological: She is alert and oriented to [place], place, and time.  Skin: Skin is warm and dry.  Psychiatric: She has a normal mood and affect.  Nursing note and vitals reviewed.         Assessment & Plan:  1.Myofascial pain syndrome chronic postoperative, as well as post radiation: S/P Breast Reconstruction Surgery x 2.  Refilled: Oxycodone 5 mg one tablet every 6 hours as needed for severe pain.#120. Continue with Exercise Regime. 2. Obesity: Losing weight continue with Healthy Diet and Exercise Regime.   20 minutes of face to face patient  care time was spent during this visit. All questions were encouraged and answered

## 2015-01-01 ENCOUNTER — Telehealth: Payer: Self-pay | Admitting: *Deleted

## 2015-01-01 DIAGNOSIS — M7918 Myalgia, other site: Secondary | ICD-10-CM

## 2015-01-01 NOTE — Telephone Encounter (Signed)
Karen Hopkins called to report that she contacted [facility] to confirm her insurance.  She says everything is okay and you can proceed with placing the order/referral for 'dry needling'.  She would like for [facility] to call when the order is placed

## 2015-01-01 NOTE — Telephone Encounter (Signed)
Ordered Placed for Dry needling. Ms. Hakanson Notified

## 2015-01-03 ENCOUNTER — Ambulatory Visit: Payer: Medicare Other | Admitting: Physical Medicine & Rehabilitation

## 2015-01-07 ENCOUNTER — Ambulatory Visit (INDEPENDENT_AMBULATORY_CARE_PROVIDER_SITE_OTHER): Payer: Medicare Other | Admitting: Family Medicine

## 2015-01-07 ENCOUNTER — Ambulatory Visit: Payer: Medicare Other | Admitting: Physical Medicine & Rehabilitation

## 2015-01-07 ENCOUNTER — Encounter: Payer: Self-pay | Admitting: Family Medicine

## 2015-01-07 VITALS — BP 134/79 | HR 95 | Temp 98.1°F | Ht 64.0 in | Wt 266.2 lb

## 2015-01-07 DIAGNOSIS — E119 Type 2 diabetes mellitus without complications: Secondary | ICD-10-CM | POA: Diagnosis present

## 2015-01-07 DIAGNOSIS — R600 Localized edema: Secondary | ICD-10-CM

## 2015-01-07 LAB — POCT GLYCOSYLATED HEMOGLOBIN (HGB A1C): HEMOGLOBIN A1C: 5.9

## 2015-01-07 MED ORDER — METFORMIN HCL 500 MG PO TABS: 500.0000 mg | ORAL_TABLET | Freq: Two times a day (BID) | ORAL | Status: DC

## 2015-01-07 NOTE — Patient Instructions (Signed)
A1c is excellent Keep up the good work with weight loss Wear compression hose daily Try no lasix for 2 weeks - I want you to try to come off this  Follow up with me in 3 months, sooner if needed Referring to foot doctor  Be well, Dr. Ardelia Mems

## 2015-01-07 NOTE — Progress Notes (Signed)
Patient ID: Karen Hopkins, female   DOB: 29-Apr-1976, 39 y.o.   MRN: [id number]  HPI:  DM: tolerating metformin 500mg  BID. No complaints with this. She is very pleased that her A1c is so much better today. Has been seeing nutritionist and has appt tomorrow. Would like referral to podiatry as she has seen them before, has chronic feet issues related to her prior chemotherapy. Weight is down. Has been watching what she eats.  Lower extremity edema: got compression hose, and these have helped a lot when she wears them. Using lasix 20mg  daily and would like to continue this medicine but is willing to undergo a trial off it.  ROS: See HPI.  [place]: hx breast cancer s/p mastectomy, allergic rhinitis, obesity, T2DM  PHYSICAL EXAM: BP 134/79 mmHg  Pulse 95  Temp(Src) 98.1 F (36.7 C) (Oral)  Ht 5\' 4"  (1.626 m)  Wt 266 lb 3.2 oz (120.748 kg)  BMI 45.67 kg/m2 Gen: NAD, pleasant, cooperative HEENT: NCAT Heart: RRR no murmur Lungs: CTAB NWOB Neuro: grossly nonfocal, speech normal Ext: minimal edema bilat lower ext Diabetic foot exam: 2+ DP pulses bilat, normal monofilament testing bilaterally. No lesions or significant calluses.   ASSESSMENT/PLAN:  Lower extremity edema Echo was normal so suspect this is related to venous insufficiency rather than cardiac etiology. Encouraged use of compression hose. Trial off lasix. Patient will call if significant return of edema and I can send in more lasix for her at that time if she really needs it, but would like her to try weight loss and compression hose first.  Type 2 diabetes mellitus without complication A2N improvement excellent. Continue metformin. Refilled today. F/u in 3 mos.  Cardiac: recent unremarkable lipids (no indication for statin) Renal: normal recent creatinine. Check urine microalbumin at next visit. Eye: discuss at next visit to see if she's gotten eye exam as instructed previously Foot: foot exam normal today, but patient with hx  of skin issues on the bottom of her feet from prior chemo and requests to go back to podiatry. Referral entered.   FOLLOW UP: F/u in 3 months for DM & swelling  [place] J. Ardelia Mems, [place]

## 2015-01-08 ENCOUNTER — Ambulatory Visit: Payer: Medicare Other | Admitting: Dietician

## 2015-01-08 NOTE — Assessment & Plan Note (Signed)
a1c improvement excellent. Continue metformin. Refilled today. F/u in 3 mos.  Cardiac: recent unremarkable lipids (no indication for statin) Renal: normal recent creatinine. Check urine microalbumin at next visit. Eye: discuss at next visit to see if she's gotten eye exam as instructed previously Foot: foot exam normal today, but patient with hx of skin issues on the bottom of her feet from prior chemo and requests to go back to podiatry. Referral entered.

## 2015-01-08 NOTE — Assessment & Plan Note (Signed)
Echo was normal so suspect this is related to venous insufficiency rather than cardiac etiology. Encouraged use of compression hose. Trial off lasix. Patient will call if significant return of edema and I can send in more lasix for her at that time if she really needs it, but would like her to try weight loss and compression hose first.

## 2015-01-09 ENCOUNTER — Ambulatory Visit: Payer: Medicare Other | Attending: Physical Medicine & Rehabilitation

## 2015-01-09 DIAGNOSIS — M7918 Myalgia, other site: Secondary | ICD-10-CM

## 2015-01-09 DIAGNOSIS — R293 Abnormal posture: Secondary | ICD-10-CM | POA: Diagnosis present

## 2015-01-09 DIAGNOSIS — E8989 Other postprocedural endocrine and metabolic complications and disorders: Secondary | ICD-10-CM | POA: Insufficient documentation

## 2015-01-09 DIAGNOSIS — M6248 Contracture of muscle, other site: Secondary | ICD-10-CM | POA: Diagnosis present

## 2015-01-09 DIAGNOSIS — M25611 Stiffness of right shoulder, not elsewhere classified: Secondary | ICD-10-CM

## 2015-01-09 DIAGNOSIS — M62838 Other muscle spasm: Secondary | ICD-10-CM

## 2015-01-09 DIAGNOSIS — M797 Fibromyalgia: Secondary | ICD-10-CM | POA: Diagnosis present

## 2015-01-09 DIAGNOSIS — M5489 Other dorsalgia: Secondary | ICD-10-CM | POA: Diagnosis present

## 2015-01-09 DIAGNOSIS — M542 Cervicalgia: Secondary | ICD-10-CM

## 2015-01-09 DIAGNOSIS — M6281 Muscle weakness (generalized): Secondary | ICD-10-CM

## 2015-01-09 DIAGNOSIS — I890 Lymphedema, not elsewhere classified: Secondary | ICD-10-CM | POA: Diagnosis present

## 2015-01-09 NOTE — Therapy (Signed)
[place], [place], [address] Phone: [phone]   Fax:  [phone]  Physical Therapy Evaluation  Patient Details  Name: Karen Hopkins MRN: [id number] Date of Birth: 11-04-1975 Referring Provider:  [name], MD  Encounter Date: 01/09/2015      PT End of Session - 01/09/15 0956    Visit Number 1   Number of Visits 12   Date for PT Re-Evaluation 02/21/15   Authorization Type Medicare KX   Authorization Time Period 01/08/15 to 9/ 16/16   Authorization - Visit Number 1   Authorization - Number of Visits 12   PT Start Time 0930   PT Stop Time 1020   PT Time Calculation (min) 50 min   Activity Tolerance Patient tolerated treatment well   Behavior During Therapy [facility] for tasks assessed/performed      Past Medical History  Diagnosis Date  . Seasonal allergies   . Lymphedema of arm     right; no BP or puncture to right arm  . Depression   . Anxiety   . Sinus headache   . Hypertension     under control with med., has been on med. x 2 yr.  . History of breast cancer 2011    right  . History of chemotherapy 2011  . History of radiation therapy 2011  . Cancer     Past Surgical History  Procedure Laterality Date  . Supra-umbilical hernia  0165  . Nasal septum surgery    . Latissimus flap to breast Right 03/12/2013    Procedure: RIGHT BREAST LATISSIMUS FLAP WITH EXPANDER PLACEMENT;  Surgeon: Theodoro Kos, DO;  Location: [place];  Service: Plastics;  Laterality: Right;  . Cesarean section  07/15/2001; 03/18/2004; 11/21/2008  . Tubal ligation  11/21/2008  . Portacath placement Left 09/12/2009  . Modified radical mastectomy Right 03/11/2010  . Port-a-cath removal Left 12/01/2010  . Removal of tissue expander and placement of implant Right 08/23/2013    Procedure: REMOVAL RIGHT TISSUE EXPANDER AND PLACEMENT OF IMPLANT TO RIGHT BREAST ;  Surgeon: Theodoro Kos, DO;  Location: [place];   Service: Plastics;  Laterality: Right;  . Breast reduction surgery Left 08/23/2013    Procedure: LEFT BREAST REDUCTION  ;  Surgeon: Theodoro Kos, DO;  Location: [place];  Service: Plastics;  Laterality: Left;  . Liposuction Bilateral 08/23/2013    Procedure: LIPOSUCTION;  Surgeon: Theodoro Kos, DO;  Location: [place];  Service: Plastics;  Laterality: Bilateral;    There were no vitals filed for this visit.  Visit Diagnosis:  Shoulder joint stiffness, right - Plan: PT plan of care cert/re-cert  Myofascial muscle pain - Plan: PT plan of care cert/re-cert  Neck pain on right side - Plan: PT plan of care cert/re-cert  Muscle spasms of neck - Plan: PT plan of care cert/re-cert  Muscle weakness of right upper extremity - Plan: PT plan of care cert/re-cert  Abnormal posture - Plan: PT plan of care cert/re-cert  Right-sided back pain, unspecified location - Plan: PT plan of care cert/re-cert      Subjective Assessment - 01/09/15 0939    Subjective She is here due to upper shoulder and back pain due to myofascial pain. She was doin well as of last visit with PT but her pain returned. She is doing HEP daily.    Patient Stated Goals decrease pain and use arm regularly (right hand dominant)   Currently in  Pain? Yes   Pain Score 7    Pain Location Shoulder   Pain Descriptors / Indicators Aching   Pain Type Chronic pain   Pain Onset More than a month ago   Pain Frequency Constant   Aggravating Factors  Nothing specific   Pain Relieving Factors dry needling   Multiple Pain Sites Yes   Pain Score 7   Pain Location Neck   Pain Orientation Right   Pain Descriptors / Indicators Aching   Pain Type Chronic pain   Pain Frequency Constant            [facility] PT Assessment - 01/09/15 0942    Assessment   Medical Diagnosis myofascial pain   Onset Date/Surgical Date 04/26/14   Prior Therapy YES    Precautions   Precaution Comments Limit heavy lifting    Restrictions   Weight Bearing Restrictions No   Balance Screen   Has the patient fallen in the past 6 months No   Prior Function   Level of Independence Independent with basic ADLs   Vocation On disability   Cognition   Overall Cognitive Status Within Functional Limits for tasks assessed   Observation/Other Assessments   Observations --   Focus on Therapeutic Outcomes (FOTO)  76%   Posture/Postural Control   Postural Limitations Rounded Shoulders;Forward head;Increased thoracic kyphosis   AROM   Right Shoulder Extension 30 Degrees  passive 42 degrees   Right Shoulder Flexion 96 Degrees  110 degrees passive   Right Shoulder ABduction 105 Degrees  passive 118 degrees   Right Shoulder Internal Rotation 47 Degrees   Right Shoulder External Rotation 42 Degrees  passive 92 degrees   Cervical Flexion 25   Cervical Extension 60   Cervical - Right Side Bend 6   Cervical - Left Side Bend 30   Cervical - Right Rotation 65   Cervical - Left Rotation 61   Strength   Right Shoulder Flexion 4/5   Right Shoulder Extension 4+/5   Right Shoulder ABduction 4/5   Right Shoulder Internal Rotation 4+/5   Right Shoulder External Rotation 4/5   Ambulation/Gait   Gait Comments WNL                    [facility] Adult PT Treatment/Exercise - 01/09/15 0942    Moist Heat Therapy   Number Minutes Moist Heat 15 Minutes   Moist Heat Location Shoulder  neck   Manual Therapy   Manual Therapy Soft tissue mobilization   Soft tissue mobilization To trap and supra spinatius with compression to trigger point and gentle RT shoulder abduction and STW to same                PT Education - 01/09/15 0956    Education provided Yes   Education Details POC   Mcginley(s) Educated Patient   Methods Explanation   Comprehension Verbalized understanding          PT Short Term Goals - 01/09/15 1005    PT SHORT TERM GOAL #1   Title Patient will report a 25% reduction in pain with usual daily  activities and home chores.   Time 3   Period Weeks   Status New   PT SHORT TERM GOAL #2   Title Patient will be able to resume a basic home ex program with minimal increase in pain    Time 3   Period Weeks   Status New   PT SHORT TERM GOAL #3  Title Right shoulder flexion improved to  115 degrees needed for reaching shoulder height shelves.   Time 3   Period Weeks   Status New   PT SHORT TERM GOAL #4   Title Berg improved  10 points form baseline   Time 3   Period Weeks   Status New         Short Term Clinic Goals - 09/03/14 1217    CC Short Term Goal  #1   Title short term goals= long term goals          PT Long Term Goals - 01/09/15 1007    PT LONG TERM GOAL #1   Title Independent with progressive HEP for further ROM, strengthening of Right UE.   Time 6   Period Weeks   Status New   PT LONG TERM GOAL #2   Title Right shoulder flexion and abduction AROM improved to 125 degrees needed for reaching eye level shelves in the kitchen.     Time 6   Period Weeks   Status New   PT LONG TERM GOAL #4   Title FOTO improved 20 ppoints for indication of overall improved function   Time 6   Period Weeks   Status New   PT LONG TERM GOAL #5   Title Patient will report overall improvement in pain level 3/10 level or better  and improve  function at 50%.   Time 6   Period Weeks   Status New   PT LONG TERM GOAL #6   Title Pt will be able to manage pain at night in order to sleep 4 or more hours of restorative sleep. Pt will verbalize 3 pain management strategies for sleep/rest   Time 6   Period Weeks   Status New           Long Term Clinic Goals - 10/23/14 1149    CC Long Term Goal  #3   Title Right arm circumference at 15 cm. proximal to olecranon will be reduced at least 0.5 cm.   Status Achieved   CC Long Term Goal  #4   Title right arm circumference at 15 cm provximal to olecranon will be reduced by 3 sm   Status Partially Met            Plan -  01/09/15 1009    Clinical Impression Statement Ms Soderquist returns with the same symptoms treated previously this year with 14 orthopedic treatments,  she was last seen 10/19/14. She apparently was doing better. She appears to havve much better passibve motion than active and is pain dominant though she obviously has some muscle spasm  and tenderness. She needs to improve range and may need to accept soem pain with improveing range.   She will have the needling set up and I will let the PT who does this to decide if pushing  range needed.    Pt will benefit from skilled therapeutic intervention in order to improve on the following deficits Impaired UE functional use;Increased fascial restricitons;Increased edema;Impaired flexibility;Postural dysfunction;Obesity;Decreased scar mobility;Decreased range of motion   Rehab Potential Good   Clinical Impairments Affecting Rehab Potential multiple medical and chronic pain issues   PT Frequency 2x / week   PT Duration 6 weeks   PT Treatment/Interventions ADLs/Self Care Home Management;Patient/family education;Passive range of motion;Dry needling;Taping;Therapeutic exercise;Moist Heat;Cryotherapy   PT Next Visit Plan REsume dry needling , HEP review, manual treatment, heat   Consulted and Agree with Plan of Care  Patient          G-Codes - 01/09/15 1030    Functional Assessment Tool Used FOTO   Functional Limitation Carrying, moving and handling objects   Carrying, Moving and Handling Objects Current Status [phone]) At least 60 percent but less than 80 percent impaired, limited or restricted   Carrying, Moving and Handling Objects Goal Status (B2620) At least 40 percent but less than 60 percent impaired, limited or restricted       Problem List Patient Active Problem List   Diagnosis Date Noted  . Type 2 diabetes mellitus without complication 35/59/7416  . Lower extremity edema 10/01/2014  . Breast cancer, right breast 05/24/2014  . Myalgia and  myositis 05/17/2014  . Neoplasm related pain 03/12/2014  . CN (constipation) 11/26/2013  . H/O reduction mammoplasty 10/30/2013  . Seizure 10/06/2013  . Status post bilateral breast implants 08/31/2013  . Acquired absence of breast and absent nipple 08/23/2013  . S/P breast reconstruction, right 08/23/2013  . AC joint arthropathy 07/02/2013  . Routine adult health maintenance 05/29/2013  . Hypokalemia 05/21/2013  . Chronic pain 04/19/2013  . Elevated blood sugar 03/09/2013  . Wheezing 02/22/2013  . Acute bronchitis 02/22/2013  . Lymphedema 05/24/2011  . RHINOSINUSITIS, CHRONIC 04/08/2010  . Obesity 08/04/2006  . DEPRESSIVE DISORDER, NOS 08/04/2006  . RHINITIS, ALLERGIC 08/04/2006    Darrel Hoover PT 01/09/2015, 11:00 AM  [facility] [address] [place], [place], [address] Phone: [phone]   Fax:  [phone]

## 2015-01-13 ENCOUNTER — Ambulatory Visit: Payer: Medicare Other

## 2015-01-13 ENCOUNTER — Ambulatory Visit: Payer: Medicare Other | Admitting: Physical Medicine & Rehabilitation

## 2015-01-14 ENCOUNTER — Ambulatory Visit: Payer: Medicare Other

## 2015-01-14 ENCOUNTER — Ambulatory Visit: Payer: Medicare Other | Admitting: Physical Therapy

## 2015-01-14 DIAGNOSIS — M62838 Other muscle spasm: Secondary | ICD-10-CM

## 2015-01-14 DIAGNOSIS — M25611 Stiffness of right shoulder, not elsewhere classified: Secondary | ICD-10-CM | POA: Diagnosis not present

## 2015-01-14 DIAGNOSIS — M7918 Myalgia, other site: Secondary | ICD-10-CM

## 2015-01-14 DIAGNOSIS — M5489 Other dorsalgia: Secondary | ICD-10-CM

## 2015-01-14 NOTE — Therapy (Signed)
[place], [place], [address] Phone: [phone]   Fax:  [phone]  Physical Therapy Treatment  Patient Details  Name: Karen Hopkins MRN: [id number] Date of Birth: 1975/09/26 Referring Provider:  [name], MD  Encounter Date: 01/14/2015      PT End of Session - 01/14/15 1328    Visit Number 2   Date for PT Re-Evaluation 02/21/15   PT Start Time 0848   PT Stop Time 9191   PT Time Calculation (min) 54 min   Activity Tolerance Patient tolerated treatment well;No increased pain   Behavior During Therapy [facility] for tasks assessed/performed      Past Medical History  Diagnosis Date  . Seasonal allergies   . Lymphedema of arm     right; no BP or puncture to right arm  . Depression   . Anxiety   . Sinus headache   . Hypertension     under control with med., has been on med. x 2 yr.  . History of breast cancer 2011    right  . History of chemotherapy 2011  . History of radiation therapy 2011  . Cancer     Past Surgical History  Procedure Laterality Date  . Supra-umbilical hernia  6606  . Nasal septum surgery    . Latissimus flap to breast Right 03/12/2013    Procedure: RIGHT BREAST LATISSIMUS FLAP WITH EXPANDER PLACEMENT;  Surgeon: Theodoro Kos, DO;  Location: [place];  Service: Plastics;  Laterality: Right;  . Cesarean section  07/15/2001; 03/18/2004; 11/21/2008  . Tubal ligation  11/21/2008  . Portacath placement Left 09/12/2009  . Modified radical mastectomy Right 03/11/2010  . Port-a-cath removal Left 12/01/2010  . Removal of tissue expander and placement of implant Right 08/23/2013    Procedure: REMOVAL RIGHT TISSUE EXPANDER AND PLACEMENT OF IMPLANT TO RIGHT BREAST ;  Surgeon: Theodoro Kos, DO;  Location: [place];  Service: Plastics;  Laterality: Right;  . Breast reduction surgery Left 08/23/2013    Procedure: LEFT BREAST REDUCTION  ;  Surgeon: Theodoro Kos, DO;  Location:  [place];  Service: Plastics;  Laterality: Left;  . Liposuction Bilateral 08/23/2013    Procedure: LIPOSUCTION;  Surgeon: Theodoro Kos, DO;  Location: [place];  Service: Plastics;  Laterality: Bilateral;    There were no vitals filed for this visit.  Visit Diagnosis:  Myofascial muscle pain  Muscle spasms of neck  Right-sided back pain, unspecified location      Subjective Assessment - 01/14/15 0858    Currently in Pain? (p) Yes   Pain Score (p) 7                          [facility] Adult PT Treatment/Exercise - 01/14/15 0001    Manual Therapy   Soft tissue mobilization Rt upper back, back paraspinals at times uses [employer], elbow and fingers.  Patient was very clear about needing verry hard and deep pressure during manual.  she was informed she may experience bruising.                  PT Short Term Goals - 01/09/15 1005    PT SHORT TERM GOAL #1   Title Patient will report a 25% reduction in pain with usual daily activities and home chores.   Time 3   Period Weeks   Status New   PT SHORT TERM GOAL #2  Title Patient will be able to resume a basic home ex program with minimal increase in pain    Time 3   Period Weeks   Status New   PT SHORT TERM GOAL #3   Title Right shoulder flexion improved to  115 degrees needed for reaching shoulder height shelves.   Time 3   Period Weeks   Status New   PT SHORT TERM GOAL #4   Title Berg improved  10 points form baseline   Time 3   Period Weeks   Status New         Short Term Clinic Goals - 09/03/14 1217    CC Short Term Goal  #1   Title short term goals= long term goals          PT Long Term Goals - 01/09/15 1007    PT LONG TERM GOAL #1   Title Independent with progressive HEP for further ROM, strengthening of Right UE.   Time 6   Period Weeks   Status New   PT LONG TERM GOAL #2   Title Right shoulder flexion and abduction AROM improved to 125 degrees  needed for reaching eye level shelves in the kitchen.     Time 6   Period Weeks   Status New   PT LONG TERM GOAL #4   Title FOTO improved 20 ppoints for indication of overall improved function   Time 6   Period Weeks   Status New   PT LONG TERM GOAL #5   Title Patient will report overall improvement in pain level 3/10 level or better  and improve  function at 50%.   Time 6   Period Weeks   Status New   PT LONG TERM GOAL #6   Title Pt will be able to manage pain at night in order to sleep 4 or more hours of restorative sleep. Pt will verbalize 3 pain management strategies for sleep/rest   Time 6   Period Weeks   Status New           Long Term Clinic Goals - 10/23/14 1149    CC Long Term Goal  #3   Title Right arm circumference at 15 cm. proximal to olecranon will be reduced at least 0.5 cm.   Status Achieved   CC Long Term Goal  #4   Title right arm circumference at 15 cm provximal to olecranon will be reduced by 3 sm   Status Partially Met            Plan - 01/14/15 1329    Clinical Impression Statement Manual, deeper tissue work at her insistance.  Less pain and tightness post session.  She was disapointed this was not a DN session.   PT Home Exercise Plan DN patient will cancel all non DN appointments.   Consulted and Agree with Plan of Care Patient        Problem List Patient Active Problem List   Diagnosis Date Noted  . Type 2 diabetes mellitus without complication 08/19/9456  . Lower extremity edema 10/01/2014  . Breast cancer, right breast 05/24/2014  . Myalgia and myositis 05/17/2014  . Neoplasm related pain 03/12/2014  . CN (constipation) 11/26/2013  . H/O reduction mammoplasty 10/30/2013  . Seizure 10/06/2013  . Status post bilateral breast implants 08/31/2013  . Acquired absence of breast and absent nipple 08/23/2013  . S/P breast reconstruction, right 08/23/2013  . AC joint arthropathy 07/02/2013  . Routine adult health maintenance 05/29/2013   .  Hypokalemia 05/21/2013  . Chronic pain 04/19/2013  . Elevated blood sugar 03/09/2013  . Wheezing 02/22/2013  . Acute bronchitis 02/22/2013  . Lymphedema 05/24/2011  . RHINOSINUSITIS, CHRONIC 04/08/2010  . Obesity 08/04/2006  . DEPRESSIVE DISORDER, NOS 08/04/2006  . RHINITIS, ALLERGIC 08/04/2006    HARRIS,KAREN 01/14/2015, 6:22 PM  [facility] [address] [place], [place], [address] Phone: [phone]   Fax:  [phone]     Melvenia Needles, PTA 01/14/2015 6:22 PM Phone: [phone] Fax: [phone] Melvenia Needles, PTA 01/14/2015 6:22 PM Phone: [phone] Fax: [phone]

## 2015-01-15 ENCOUNTER — Other Ambulatory Visit: Payer: Self-pay | Admitting: Physical Medicine & Rehabilitation

## 2015-01-15 ENCOUNTER — Ambulatory Visit (HOSPITAL_BASED_OUTPATIENT_CLINIC_OR_DEPARTMENT_OTHER): Payer: Medicare Other

## 2015-01-15 VITALS — BP 133/90 | HR 97 | Temp 98.3°F

## 2015-01-15 DIAGNOSIS — Z5111 Encounter for antineoplastic chemotherapy: Secondary | ICD-10-CM

## 2015-01-15 DIAGNOSIS — C50911 Malignant neoplasm of unspecified site of right female breast: Secondary | ICD-10-CM

## 2015-01-15 MED ORDER — GOSERELIN ACETATE 3.6 MG ~~LOC~~ IMPL
3.6000 mg | DRUG_IMPLANT | Freq: Once | SUBCUTANEOUS | Status: AC
  Administered 2015-01-15: 3.6 mg via SUBCUTANEOUS
  Filled 2015-01-15: qty 3.6

## 2015-01-16 ENCOUNTER — Ambulatory Visit: Payer: Medicare Other | Admitting: Physical Therapy

## 2015-01-20 ENCOUNTER — Encounter: Payer: Medicare Other | Attending: Registered Nurse | Admitting: Registered Nurse

## 2015-01-20 ENCOUNTER — Encounter: Payer: Self-pay | Admitting: Registered Nurse

## 2015-01-20 VITALS — BP 124/73 | HR 81

## 2015-01-20 DIAGNOSIS — M791 Myalgia: Secondary | ICD-10-CM | POA: Diagnosis not present

## 2015-01-20 DIAGNOSIS — M545 Low back pain, unspecified: Secondary | ICD-10-CM

## 2015-01-20 DIAGNOSIS — G894 Chronic pain syndrome: Secondary | ICD-10-CM

## 2015-01-20 DIAGNOSIS — G893 Neoplasm related pain (acute) (chronic): Secondary | ICD-10-CM

## 2015-01-20 DIAGNOSIS — Z5181 Encounter for therapeutic drug level monitoring: Secondary | ICD-10-CM

## 2015-01-20 DIAGNOSIS — M546 Pain in thoracic spine: Secondary | ICD-10-CM | POA: Diagnosis present

## 2015-01-20 DIAGNOSIS — Z79899 Other long term (current) drug therapy: Secondary | ICD-10-CM

## 2015-01-20 DIAGNOSIS — I890 Lymphedema, not elsewhere classified: Secondary | ICD-10-CM | POA: Diagnosis not present

## 2015-01-20 DIAGNOSIS — M7918 Myalgia, other site: Secondary | ICD-10-CM

## 2015-01-20 MED ORDER — OXYCODONE HCL 5 MG PO TABS: 5.0000 mg | ORAL_TABLET | Freq: Four times a day (QID) | ORAL | Status: DC | PRN

## 2015-01-20 NOTE — Progress Notes (Signed)
Subjective:    Patient ID: Karen Hopkins, female    DOB: March 12, 1976, 39 y.o.   MRN: [id number]  HPI: [name] is a 39 year old female who returns for follow up for chronic pain and medication refill. She says her pain is located in her neck,right shoulder and entire back.She rates her pain 7. Her current exercise regime is walking, performing stretching exercises and attending Physical Therapy for Dry Needling.  Pain Inventory Average Pain 8 Pain Right Now 7 My pain is constant  In the last 24 hours, has pain interfered with the following? General activity 7 Relation with others 7 Enjoyment of life 7 What TIME of day is your pain at its worst? morning daytime night Sleep (in general) Poor  Pain is worse with: walking, bending, sitting, inactivity, standing and some activites Pain improves with: rest and therapy/exercise Relief from Meds: 8  Mobility walk without assistance how many minutes can you walk? 10 ability to climb steps?  yes do you drive?  yes  Function disabled: date disabled . I need assistance with the following:  household duties  Neuro/Psych numbness tremor tingling spasms dizziness confusion depression  Prior Studies Any changes since last visit?  no  Physicians involved in your care Any changes since last visit?  no   Family History  Problem Relation Age of Onset  . Hypertension Mother   . Diabetes type II Father   . Prostate cancer Father    Social History   Social History  . Marital Status: Single    Spouse Name: N/A  . Number of Children: N/A  . Years of Education: N/A   Social History Main Topics  . Smoking status: Former Smoker -- 0.25 packs/day for 5 years    Quit date: 04/26/2010  . Smokeless tobacco: Never Used  . Alcohol Use: No  . Drug Use: No  . Sexual Activity: Not Currently    Birth Control/ Protection: Surgical   Other Topics Concern  . None   Social History Narrative   Past Surgical History    Procedure Laterality Date  . Supra-umbilical hernia  7001  . Nasal septum surgery    . Latissimus flap to breast Right 03/12/2013    Procedure: RIGHT BREAST LATISSIMUS FLAP WITH EXPANDER PLACEMENT;  Surgeon: Theodoro Kos, DO;  Location: [place];  Service: Plastics;  Laterality: Right;  . Cesarean section  07/15/2001; 03/18/2004; 11/21/2008  . Tubal ligation  11/21/2008  . Portacath placement Left 09/12/2009  . Modified radical mastectomy Right 03/11/2010  . Port-a-cath removal Left 12/01/2010  . Removal of tissue expander and placement of implant Right 08/23/2013    Procedure: REMOVAL RIGHT TISSUE EXPANDER AND PLACEMENT OF IMPLANT TO RIGHT BREAST ;  Surgeon: Theodoro Kos, DO;  Location: [place];  Service: Plastics;  Laterality: Right;  . Breast reduction surgery Left 08/23/2013    Procedure: LEFT BREAST REDUCTION  ;  Surgeon: Theodoro Kos, DO;  Location: [place];  Service: Plastics;  Laterality: Left;  . Liposuction Bilateral 08/23/2013    Procedure: LIPOSUCTION;  Surgeon: Theodoro Kos, DO;  Location: [place];  Service: Plastics;  Laterality: Bilateral;   Past Medical History  Diagnosis Date  . Seasonal allergies   . Lymphedema of arm     right; no BP or puncture to right arm  . Depression   . Anxiety   . Sinus headache   . Hypertension     under control with med.,  has been on med. x 2 yr.  . History of breast cancer 2011    right  . History of chemotherapy 2011  . History of radiation therapy 2011  . Cancer    BP 124/73 mmHg  Pulse 81  SpO2 97%  Opioid Risk Score:   Fall Risk Score:  `1  Depression screen PHQ 2/9  Depression screen [facility] 2/9 01/20/2015 01/07/2015 11/21/2014 10/07/2014 10/01/2014 09/13/2014 09/12/2014  Decreased Interest 0 0 0 0 0 0 2  Down, Depressed, Hopeless 0 0 0 0 0 0 2  PHQ - 2 Score 0 0 0 0 0 0 4  Altered sleeping - - - - - - 3  Tired, decreased energy - - - - - - 3  Change in appetite - - - - - - 3  Feeling bad  or failure about yourself  - - - - - - 3  Trouble concentrating - - - - - - 3  Moving slowly or fidgety/restless - - - - - - 2  Suicidal thoughts - - - - - - 0  PHQ-9 Score - - - - - - 21     Review of Systems  Musculoskeletal:       Spasms  Neurological: Positive for dizziness, tremors and numbness.       Tingling  Psychiatric/Behavioral: Positive for confusion and dysphoric mood. The patient is nervous/anxious.   All other systems reviewed and are negative.      Objective:   Physical Exam  Constitutional: She is oriented to [place], place, and time. She appears well-developed and well-nourished.  HENT:  Head: Normocephalic and atraumatic.  Neck: Normal range of motion. Neck supple.  Cardiovascular: Normal rate and regular rhythm.   Pulmonary/Chest: Effort normal and breath sounds normal.  Musculoskeletal:  Normal Muscle Bulk and Muscle Testing Reveals: Upper extremities: Decreased ROM 45 Degrees and Muscle Strength 5/5 Left: Full ROM and Muscle Strength 5/5 Right AC Joint Tenderness Thoracic Paraspinal Tenderness: T-1- T-5 Mainly Right Side Lumbar Paraspinal Tenderness: L-3- L-5 Lower Extremities: Full ROM and Muscle Strength 5/5 Arises from chair with ease Narrow Based gait  Neurological: She is alert and oriented to [place], place, and time.  Skin: Skin is warm and dry.  Psychiatric: She has a normal mood and affect.  Nursing note and vitals reviewed.         Assessment & Plan:  1.Myofascial pain syndrome chronic postoperative, as well as post radiation: S/P Breast Reconstruction Surgery x 2.  Refilled: Oxycodone 5 mg one tablet every 6 hours as needed for severe pain.#120. Continue with Exercise Regime. 2. Obesity: Losing weight continue with Healthy Diet and Exercise Regime.   20 minutes of face to face patient care time was spent during this visit. All questions were encouraged and answered

## 2015-01-21 ENCOUNTER — Other Ambulatory Visit: Payer: Self-pay | Admitting: Registered Nurse

## 2015-01-21 ENCOUNTER — Ambulatory Visit: Payer: Medicare Other | Admitting: Physical Therapy

## 2015-01-22 LAB — PMP ALCOHOL METABOLITE (ETG): ETGU: NEGATIVE ng/mL

## 2015-01-23 ENCOUNTER — Ambulatory Visit: Payer: Medicare Other | Admitting: Physical Therapy

## 2015-01-25 LAB — BENZODIAZEPINES (GC/LC/MS), URINE
ALPRAZOLAMU: 349 ng/mL (ref <25)
CLONAZEPAU: NEGATIVE ng/mL (ref <25)
Flurazepam metabolite (GC/LC/MS), ur confirm: NEGATIVE ng/mL (ref <50)
Lorazepam (GC/LC/MS), ur confirm: NEGATIVE ng/mL (ref <50)
Midazolam (GC/LC/MS), ur confirm: NEGATIVE ng/mL (ref <50)
Nordiazepam (GC/LC/MS), ur confirm: NEGATIVE ng/mL (ref <50)
OXAZEPAMU: NEGATIVE ng/mL (ref <50)
TRIAZOLAMU: NEGATIVE ng/mL (ref <50)
Temazepam (GC/LC/MS), ur confirm: NEGATIVE ng/mL (ref <50)

## 2015-01-25 LAB — OPIATES/OPIOIDS (LC/MS-MS)
Codeine Urine: NEGATIVE ng/mL (ref <50)
HYDROCODONE: NEGATIVE ng/mL (ref <50)
HYDROMORPHONE: NEGATIVE ng/mL (ref <50)
Morphine Urine: NEGATIVE ng/mL (ref <50)
NOROXYCODONE, UR: 4790 ng/mL (ref <50)
Norhydrocodone, Ur: NEGATIVE ng/mL (ref <50)
Oxycodone, ur: 2969 ng/mL (ref <50)
Oxymorphone: 1734 ng/mL (ref <50)

## 2015-01-25 LAB — OXYCODONE, URINE (LC/MS-MS)
Noroxycodone, Ur: 4790 ng/mL (ref <50)
OXYCODONE, UR: 2969 ng/mL (ref <50)
Oxymorphone: 1734 ng/mL (ref <50)

## 2015-01-27 ENCOUNTER — Ambulatory Visit: Payer: Medicare Other | Admitting: Physical Therapy

## 2015-01-27 ENCOUNTER — Telehealth: Payer: Self-pay | Admitting: Registered Nurse

## 2015-01-27 DIAGNOSIS — M62838 Other muscle spasm: Secondary | ICD-10-CM

## 2015-01-27 DIAGNOSIS — M7918 Myalgia, other site: Secondary | ICD-10-CM

## 2015-01-27 DIAGNOSIS — M25611 Stiffness of right shoulder, not elsewhere classified: Secondary | ICD-10-CM | POA: Diagnosis not present

## 2015-01-27 DIAGNOSIS — M542 Cervicalgia: Secondary | ICD-10-CM

## 2015-01-27 LAB — PRESCRIPTION MONITORING PROFILE (SOLSTAS)
Amphetamine/Meth: NEGATIVE ng/mL
Barbiturate Screen, Urine: NEGATIVE ng/mL
Buprenorphine, Urine: NEGATIVE ng/mL
COCAINE METABOLITES: NEGATIVE ng/mL
CREATININE, URINE: 151.75 mg/dL (ref >20.0)
Cannabinoid Scrn, Ur: NEGATIVE ng/mL
Carisoprodol, Urine: NEGATIVE ng/mL
ECSTASY: NEGATIVE ng/mL
Fentanyl, Ur: NEGATIVE ng/mL
MEPERIDINE UR: NEGATIVE ng/mL
METHADONE SCREEN, URINE: NEGATIVE ng/mL
Nitrites, Initial: NEGATIVE ug/mL
PH URINE, INITIAL: 5.6 pH (ref 4.5–8.9)
Propoxyphene: NEGATIVE ng/mL
Tapentadol, urine: NEGATIVE ng/mL
Tramadol Scrn, Ur: NEGATIVE ng/mL
Zolpidem, Urine: NEGATIVE ng/mL

## 2015-01-27 NOTE — Telephone Encounter (Signed)
I was in contact with Dr. Letta Pate regarding increasing [name] to 10 mg TID he agrees. I called [name] she will call office tomorrow with her pill count she's not at home, and she will return the Hopkins 5 mg prescription and I will give her prescription for 10 mg TID. She verbalizes understanding.

## 2015-01-27 NOTE — Therapy (Signed)
[place], [place], [address] Phone: [phone]   Fax:  [phone]  Physical Therapy Treatment  Patient Details  Name: Karen Hopkins MRN: [id number] Date of Birth: 1976-01-08 Referring Provider:  [name], MD  Encounter Date: 01/27/2015      PT End of Session - 01/27/15 1758    Visit Number 3   Number of Visits 12   Date for PT Re-Evaluation 02/21/15   Authorization Type Medicare KX   Authorization Time Period 01/08/15 to 9/ 16/16   PT Start Time 1231   PT Stop Time 1330   PT Time Calculation (min) 59 min   Activity Tolerance Patient tolerated treatment well   Behavior During Therapy [facility] for tasks assessed/performed      Past Medical History  Diagnosis Date  . Seasonal allergies   . Lymphedema of arm     right; no BP or puncture to right arm  . Depression   . Anxiety   . Sinus headache   . Hypertension     under control with med., has been on med. x 2 yr.  . History of breast cancer 2011    right  . History of chemotherapy 2011  . History of radiation therapy 2011  . Cancer     Past Surgical History  Procedure Laterality Date  . Supra-umbilical hernia  4166  . Nasal septum surgery    . Latissimus flap to breast Right 03/12/2013    Procedure: RIGHT BREAST LATISSIMUS FLAP WITH EXPANDER PLACEMENT;  Surgeon: Theodoro Kos, DO;  Location: [place];  Service: Plastics;  Laterality: Right;  . Cesarean section  07/15/2001; 03/18/2004; 11/21/2008  . Tubal ligation  11/21/2008  . Portacath placement Left 09/12/2009  . Modified radical mastectomy Right 03/11/2010  . Port-a-cath removal Left 12/01/2010  . Removal of tissue expander and placement of implant Right 08/23/2013    Procedure: REMOVAL RIGHT TISSUE EXPANDER AND PLACEMENT OF IMPLANT TO RIGHT BREAST ;  Surgeon: Theodoro Kos, DO;  Location: [place];  Service: Plastics;  Laterality: Right;  . Breast reduction surgery Left  08/23/2013    Procedure: LEFT BREAST REDUCTION  ;  Surgeon: Theodoro Kos, DO;  Location: [place];  Service: Plastics;  Laterality: Left;  . Liposuction Bilateral 08/23/2013    Procedure: LIPOSUCTION;  Surgeon: Theodoro Kos, DO;  Location: [place];  Service: Plastics;  Laterality: Bilateral;    There were no vitals filed for this visit.  Visit Diagnosis:  Myofascial muscle pain  Muscle spasms of neck  Neck pain on right side      Subjective Assessment - 01/27/15 1234    Subjective Karen Hopkins is feeling stiff and same problems as on evaluation  tenderness over R pec major and Right neck and shoudler   Pertinent History Right breast cancer with mastectomy 2011; reconstruction; left breast reduction; h/o right UE lymphedema and right upper quadrant pain, treated in this clinic several times before.  Recent scare with left breeast, but but ultrasound and mammogram were negative.   Currently in Pain? Yes   Pain Score 8    Pain Location Shoulder   Pain Orientation Right   Pain Descriptors / Indicators Aching   Pain Type Chronic pain   Pain Onset More than a month ago   Pain Frequency Constant   Aggravating Factors  nothing   Pain Relieving Factors dry needling   Pain Score 8   Pain Location Neck  Pain Orientation Right   Pain Descriptors / Indicators Aching   Pain Type Chronic pain   Pain Frequency Constant                         [facility] Adult PT Treatment/Exercise - 01/27/15 1239    Moist Heat Therapy   Number Minutes Moist Heat 15 Minutes   Moist Heat Location Cervical  limiting heat to neck only   Manual Therapy   Manual Therapy Soft tissue mobilization   Joint Mobilization Cervical P-A mob and lateral mobs grade 3 3x 20 sec C4-C7; also Thoracic PA mobs T-3 to T-8, superior clavicle mobs Grade 3/4 3 bouts of 30 sec   Soft tissue mobilization To trap and supra spinatius with compression to trigger point and gentle RT shoulder  abduction and STW to same   Myofascial Release suboccipital release 3x 20;SCM bending and MFR to rhomboids , upper traps, levator, SCM and scalene onRight MFR   Neck Exercises: Stretches   Upper Trapezius Stretch 2 reps;30 seconds  assistance with towel   Levator Stretch 2 reps;30 seconds  assistance with towel    Lower Cervical/Upper Thoracic Stretch 2 reps;30 seconds          Trigger Point Dry Needling - 01/27/15 1306    Consent Given? Yes   Education Handout Provided No  previously given   Muscles Treated Upper Body Upper trapezius;Oblique capitus;Suboccipitals muscle group;Pectoralis major;Rhomboids;Levator scapulae  right side only for dry needling   Upper Trapezius Response Twitch reponse elicited  rightside only   Oblique Capitus Response Twitch response elicited;Palpable increased muscle length   SubOccipitals Response Twitch response elicited;Palpable increased muscle length   Pectoralis Major Response Twitch response elicited;Palpable increased muscle length   Levator Scapulae Response Twitch response elicited;Palpable increased muscle length   Rhomboids Response Palpable increased muscle length              PT Education - 01/27/15 1755    Education provided Yes   Education Details Educated on using towel to enhance neck stretches   [name] Educated Patient   Methods Explanation;Demonstration   Comprehension Verbalized understanding;Returned demonstration          PT Short Term Goals - 01/27/15 1232    PT SHORT TERM GOAL #1   Title Patient will report a 25% reduction in pain with usual daily activities and home chores.   Time 3   Period Weeks   Status On-going   PT SHORT TERM GOAL #2   Title Patient will be able to resume a basic home ex program with minimal increase in pain    Time 3   Period Weeks   Status On-going   PT SHORT TERM GOAL #3   Title Right shoulder flexion improved to  115 degrees needed for reaching shoulder height shelves.   Time  3   Period Weeks   Status On-going   PT SHORT TERM GOAL #4   Title Berg improved  10 points form baseline   Baseline 60   Time 3   Period Weeks   Status On-going         Short Term Clinic Goals - 09/03/14 1217    CC Short Term Goal  #1   Title short term goals= long term goals          PT Long Term Goals - 01/27/15 1233    PT LONG TERM GOAL #1   Title Independent with progressive HEP for further  ROM, strengthening of Right UE.   Baseline ongoing goal, pt with increased body swelling and seeing MD   Time 6   Period Weeks   Status On-going   PT LONG TERM GOAL #2   Title Right shoulder flexion and abduction AROM improved to 125 degrees needed for reaching eye level shelves in the kitchen.     Time 6   Period Weeks   Status On-going   PT LONG TERM GOAL #3   Title Right UE strength grossly 4-/5 needed for basic cooking/cleaning for her 3 children.     Time 14   Period Weeks   Status Partially Met   PT LONG TERM GOAL #4   Title FOTO improved 20 ppoints for indication of overall improved function   Baseline newest score 60    Time 6   Period Weeks   Status On-going   PT LONG TERM GOAL #5   Title Patient will report overall improvement in pain level 3/10 level or better  and improve  function at 50%.   Baseline Pain is sporadic, dry needling is helpful but pain is intermittentn   Time 6   Period Weeks   Status On-going   PT LONG TERM GOAL #6   Title Pt will be able to manage pain at night in order to sleep 4 or more hours of restorative sleep. Pt will verbalize 3 pain management strategies for sleep/rest   Time 6   Period Weeks   Status On-going           Long Term Clinic Goals - 10/23/14 1149    CC Long Term Goal  #3   Title Right arm circumference at 15 cm. proximal to olecranon will be reduced at least 0.5 cm.   Status Achieved   CC Long Term Goal  #4   Title right arm circumference at 15 cm provximal to olecranon will be reduced by 3 sm   Status  Partially Met            Plan - 01/27/15 1317    Clinical Impression Statement Pt entered clinic with 8/10 right shoulder pain and post treatment 5-6/10 and is able to breathe more deeply. and able to flex shoulder to 90 without pain post treament in R pectoral region    Pt will benefit from skilled therapeutic intervention in order to improve on the following deficits Impaired UE functional use;Increased fascial restricitons;Increased edema;Impaired flexibility;Postural dysfunction;Obesity;Decreased scar mobility;Decreased range of motion   Rehab Potential Good   Clinical Impairments Affecting Rehab Potential multiple medical and chronic pain issues   PT Frequency 2x / week   PT Duration 6 weeks   PT Treatment/Interventions ADLs/Self Care Home Management;Patient/family education;Passive range of motion;Dry needling;Taping;Therapeutic exercise;Moist Heat;Cryotherapy   PT Next Visit Plan REsume dry needling , HEP review, manual treatment, heat   PT Home Exercise Plan use towel for upper trap and levator stretch        Problem List Patient Active Problem List   Diagnosis Date Noted  . Type 2 diabetes mellitus without complication 54/49/2010  . Lower extremity edema 10/01/2014  . Breast cancer, right breast 05/24/2014  . Myalgia and myositis 05/17/2014  . Neoplasm related pain 03/12/2014  . CN (constipation) 11/26/2013  . H/O reduction mammoplasty 10/30/2013  . Seizure 10/06/2013  . Status post bilateral breast implants 08/31/2013  . Acquired absence of breast and absent nipple 08/23/2013  . S/P breast reconstruction, right 08/23/2013  . AC joint arthropathy 07/02/2013  . Routine  adult health maintenance 05/29/2013  . Hypokalemia 05/21/2013  . Chronic pain 04/19/2013  . Elevated blood sugar 03/09/2013  . Wheezing 02/22/2013  . Acute bronchitis 02/22/2013  . Lymphedema 05/24/2011  . RHINOSINUSITIS, CHRONIC 04/08/2010  . Obesity 08/04/2006  . DEPRESSIVE DISORDER, NOS  08/04/2006  . RHINITIS, ALLERGIC 08/04/2006   Voncille Lo, PT 01/27/2015 6:06 PM Phone: [phone] Fax: [facility] [place], [place], [address] Phone: [phone]   Fax:  [phone]

## 2015-01-28 ENCOUNTER — Ambulatory Visit: Payer: Medicare Other

## 2015-01-28 ENCOUNTER — Encounter: Payer: Medicare Other | Admitting: Podiatry

## 2015-01-29 ENCOUNTER — Telehealth: Payer: Self-pay | Admitting: Registered Nurse

## 2015-01-29 ENCOUNTER — Telehealth: Payer: Self-pay | Admitting: *Deleted

## 2015-01-29 MED ORDER — OXYCODONE HCL 10 MG PO TABS: 10.0000 mg | ORAL_TABLET | Freq: Three times a day (TID) | ORAL | Status: DC | PRN

## 2015-01-29 NOTE — Telephone Encounter (Signed)
Pt called back as requested and reports her pill count is #[phone]

## 2015-01-29 NOTE — Telephone Encounter (Signed)
Placed a call to Ms. Hodgkin, she states she has 39 tablets of Oxycodone.  She was instructed to use her oxycodone two tablets three times a day. Also will be picking up her new prescription of Oxycodone 10 mg TID and will return the old prescription. She was instructed to pick up her prescription no later than 01/31/15. She verbalizes understanding.

## 2015-01-30 ENCOUNTER — Ambulatory Visit: Payer: Medicare Other | Admitting: Physical Therapy

## 2015-01-31 NOTE — Progress Notes (Signed)
Urine drug screen for this encounter is consistent for prescribed medication 

## 2015-02-03 ENCOUNTER — Encounter: Payer: Medicare Other | Admitting: Physical Therapy

## 2015-02-03 ENCOUNTER — Ambulatory Visit: Payer: Medicare Other | Admitting: Physical Therapy

## 2015-02-03 DIAGNOSIS — M5489 Other dorsalgia: Secondary | ICD-10-CM

## 2015-02-03 DIAGNOSIS — R293 Abnormal posture: Secondary | ICD-10-CM

## 2015-02-03 DIAGNOSIS — M25611 Stiffness of right shoulder, not elsewhere classified: Secondary | ICD-10-CM | POA: Diagnosis not present

## 2015-02-03 DIAGNOSIS — E8989 Other postprocedural endocrine and metabolic complications and disorders: Secondary | ICD-10-CM

## 2015-02-03 DIAGNOSIS — M6281 Muscle weakness (generalized): Secondary | ICD-10-CM

## 2015-02-03 DIAGNOSIS — M7918 Myalgia, other site: Secondary | ICD-10-CM

## 2015-02-03 DIAGNOSIS — M542 Cervicalgia: Secondary | ICD-10-CM

## 2015-02-03 DIAGNOSIS — M62838 Other muscle spasm: Secondary | ICD-10-CM

## 2015-02-03 DIAGNOSIS — I890 Lymphedema, not elsewhere classified: Secondary | ICD-10-CM

## 2015-02-03 NOTE — Therapy (Signed)
[facility] [address] [place], [place], [address] Phone: [phone]   Fax:  [phone]  Physical Therapy Treatment  Patient Details  Name: Karen Hopkins MRN: [id number] Date of Birth: 03/03/1976 Referring Provider:  McIntyre, Brittany J, MD  Encounter Date: 02/03/2015      PT End of Session - 02/03/15 1343    Visit Number 4   Number of Visits 12   Date for PT Re-Evaluation 02/21/15   Authorization Type Medicare KX   Authorization Time Period 01/08/15 to 9/ 16/16   PT Start Time 1230   PT Stop Time 1320   PT Time Calculation (min) 50 min   Activity Tolerance Patient tolerated treatment well   Behavior During Therapy WFL for tasks assessed/performed      Past Medical History  Diagnosis Date  . Seasonal allergies   . Lymphedema of arm     right; no BP or puncture to right arm  . Depression   . Anxiety   . Sinus headache   . Hypertension     under control with med., has been on med. x 2 yr.  . History of breast cancer 2011    right  . History of chemotherapy 2011  . History of radiation therapy 2011  . Cancer     Past Surgical History  Procedure Laterality Date  . Supra-umbilical hernia  2006  . Nasal septum surgery    . Latissimus flap to breast Right 03/12/2013    Procedure: RIGHT BREAST LATISSIMUS FLAP WITH EXPANDER PLACEMENT;  Surgeon: Claire Sanger, DO;  Location: [facility];  Service: Plastics;  Laterality: Right;  . Cesarean section  07/15/2001; 03/18/2004; 11/21/2008  . Tubal ligation  11/21/2008  . Portacath placement Left 09/12/2009  . Modified radical mastectomy Right 03/11/2010  . Port-a-cath removal Left 12/01/2010  . Removal of tissue expander and placement of implant Right 08/23/2013    Procedure: REMOVAL RIGHT TISSUE EXPANDER AND PLACEMENT OF IMPLANT TO RIGHT BREAST ;  Surgeon: Claire Sanger, DO;  Location: [facility];  Service: Plastics;  Laterality: Right;  . Breast reduction surgery Left  08/23/2013    Procedure: LEFT BREAST REDUCTION  ;  Surgeon: Claire Sanger, DO;  Location: [facility];  Service: Plastics;  Laterality: Left;  . Liposuction Bilateral 08/23/2013    Procedure: LIPOSUCTION;  Surgeon: Claire Sanger, DO;  Location: [facility];  Service: Plastics;  Laterality: Bilateral;    There were no vitals filed for this visit.  Visit Diagnosis:  Myofascial muscle pain  Muscle spasms of neck  Neck pain on right side  Right-sided back pain, unspecified location  Shoulder joint stiffness, right  Muscle weakness of right upper extremity  Abnormal posture  Lymphedema of upper extremity following lymphadenectomy  Stiffness of joint, shoulder region, right      Subjective Assessment - 02/03/15 1235    Subjective I could not get here last appt.  My car was in the shop.  but I sure need to see you today.  Pt had a sharp pain in esophagus but now is resolved. but sometimes I could nt breathe deeply.  but I took a breathing treatment at home and now I can swallow fine.  ( Pt  encouraged to see MD)  Pt states she does not have time to do anything.     Pertinent History Right breast cancer with mastectomy 2011; reconstruction; left breast reduction; h/o right UE lymphedema and right upper quadrant pain, treated   in this clinic several times before.  Recent scare with left breeast, but but ultrasound and mammogram were negative.   Limitations House hold activities   How long can you sit comfortably? numbness    Diagnostic tests mammogram, U/S;  another follow up with left breast due now   Patient Stated Goals decrease pain and use arm regularly (right hand dominant)   Currently in Pain? Yes   Pain Score 7    Pain Location Shoulder   Pain Orientation Right   Pain Descriptors / Indicators Aching   Pain Type Chronic pain   Pain Onset More than a month ago   Pain Frequency Constant   Pain Relieving Factors dry needling            [facility] PT  Assessment - 02/03/15 1242    AROM   Right Shoulder Extension 30 Degrees  passive 42 degrees   Right Shoulder Flexion 105 Degrees  115 degrees passive discomfort   Right Shoulder ABduction 110 Degrees  passive 122 degrees   Right Shoulder Internal Rotation 52 Degrees  starts compensating at 52 degrees   Right Shoulder External Rotation 75 Degrees  passive full 92    Cervical Flexion 25   Cervical Extension 60   Cervical - Right Side Bend 35   Cervical - Left Side Bend 50   Cervical - Right Rotation 65   Cervical - Left Rotation 65                     [facility] Adult PT Treatment/Exercise - 02/03/15 1249    Shoulder Exercises: Standing   External Rotation 10 reps;Both;Strengthening;Theraband   Theraband Level (Shoulder External Rotation) Level 2 (Red)  VC for correct execution  x 2   Extension Strengthening;Both;Theraband;10 reps   Theraband Level (Shoulder Extension) Level 2 (Red)  x2 VC for correct execution   Row Strengthening;Both;10 reps;Theraband   Theraband Level (Shoulder Row) Level 2 (Red)  x 10 VC for execution   Manual Therapy   Manual Therapy Soft tissue mobilization   Joint Mobilization Cervical P-A mob and lateral mobs grade 3 3x 20 sec C4-C7; also Thoracic PA mobs T-1to T-8,    Soft tissue mobilization upper trap, levator and rhomboids right side only          Trigger Point Dry Needling - 02/03/15 1250    Consent Given? Yes   Muscles Treated Upper Body Upper trapezius  R side only DN, erector spinae C-3 to T-4  twitch response    Upper Trapezius Response Twitch reponse elicited;Palpable increased muscle length   Levator Scapulae Response Twitch response elicited;Palpable increased muscle length   Rhomboids Response Palpable increased muscle length   Supraspinatus Response Twitch response elicited;Palpable increased muscle length              PT Education - 02/03/15 1319    Education provided Yes   Education Details reviewed exercises  for scapular stab and gave handout, discussed importance for making time for exercise to control upper trap pain and elevation   Verrilli(s) Educated Patient   Methods Handout;Demonstration;Explanation;Verbal cues   Comprehension Verbalized understanding;Returned demonstration          PT Short Term Goals - 02/03/15 1353    PT SHORT TERM GOAL #1   Title Patient will report a 25% reduction in pain with usual daily activities and home chores.   Time 3   Period Weeks   Status On-going   PT SHORT TERM GOAL #2  Title Patient will be able to resume a basic home ex program with minimal increase in pain    Time 3   Period Weeks   Status On-going   PT SHORT TERM GOAL #3   Baseline 110   Time 3   Period Weeks   Status Partially Met   PT SHORT TERM GOAL #4   Title Berg improved  10 points form baseline   Time 3   Period Weeks   Status On-going         Short Term Clinic Goals - 09/03/14 1217    CC Short Term Goal  #1   Title short term goals= long term goals          PT Long Term Goals - 01/27/15 1233    PT LONG TERM GOAL #1   Title Independent with progressive HEP for further ROM, strengthening of Right UE.   Baseline ongoing goal, pt with increased body swelling and seeing MD   Time 6   Period Weeks   Status On-going   PT LONG TERM GOAL #2   Title Right shoulder flexion and abduction AROM improved to 125 degrees needed for reaching eye level shelves in the kitchen.     Time 6   Period Weeks   Status On-going   PT LONG TERM GOAL #3   Title Right UE strength grossly 4-/5 needed for basic cooking/cleaning for her 3 children.     Time 14   Period Weeks   Status Partially Met   PT LONG TERM GOAL #4   Title FOTO improved 20 ppoints for indication of overall improved function   Baseline newest score 60    Time 6   Period Weeks   Status On-going   PT LONG TERM GOAL #5   Title Patient will report overall improvement in pain level 3/10 level or better  and improve   function at 50%.   Baseline Pain is sporadic, dry needling is helpful but pain is intermittentn   Time 6   Period Weeks   Status On-going   PT LONG TERM GOAL #6   Title Pt will be able to manage pain at night in order to sleep 4 or more hours of restorative sleep. Pt will verbalize 3 pain management strategies for sleep/rest   Time 6   Period Weeks   Status On-going           Long Term Clinic Goals - 10/23/14 1149    CC Long Term Goal  #3   Title Right arm circumference at 15 cm. proximal to olecranon will be reduced at least 0.5 cm.   Status Achieved   CC Long Term Goal  #4   Title right arm circumference at 15 cm provximal to olecranon will be reduced by 3 sm   Status Partially Met            Plan - 02/03/15 1318    Clinical Impression Statement Pt initially 7/10 with pain on right shoulder,   Pt states a 4-5/10 and had increased AROM in cervical and R shoulder, Cervical Side bend R 35 and left 50 degrees, Right shoulder flex 105, abd 110 and ER 75.  Pt IR R shoulder still liimited and painful and endrange.   Pt  was educated on importrance of continuing exercise even if she is sore and having a hectic schedule.  Pt declined heat for cervical due to having to go to school for son.   Pt will benefit   from skilled therapeutic intervention in order to improve on the following deficits Impaired UE functional use;Increased fascial restricitons;Increased edema;Impaired flexibility;Postural dysfunction;Obesity;Decreased scar mobility;Decreased range of motion   Rehab Potential Good   Clinical Impairments Affecting Rehab Potential multiple medical and chronic pain issues   PT Frequency 2x / week   PT Duration 6 weeks   PT Treatment/Interventions ADLs/Self Care Home Management;Patient/family education;Passive range of motion;Dry needling;Taping;Therapeutic exercise;Moist Heat;Cryotherapy   PT Next Visit Plan Assess dry needling.  HEP review continuing heat for cervical   PT Home  Exercise Plan scapular rows, ext and Bil ER and handout   Consulted and Agree with Plan of Care Patient        Problem List Patient Active Problem List   Diagnosis Date Noted  . Type 2 diabetes mellitus without complication 58/52/7782  . Lower extremity edema 10/01/2014  . Breast cancer, right breast 05/24/2014  . Myalgia and myositis 05/17/2014  . Neoplasm related pain 03/12/2014  . CN (constipation) 11/26/2013  . H/O reduction mammoplasty 10/30/2013  . Seizure 10/06/2013  . Status post bilateral breast implants 08/31/2013  . Acquired absence of breast and absent nipple 08/23/2013  . S/P breast reconstruction, right 08/23/2013  . AC joint arthropathy 07/02/2013  . Routine adult health maintenance 05/29/2013  . Hypokalemia 05/21/2013  . Chronic pain 04/19/2013  . Elevated blood sugar 03/09/2013  . Wheezing 02/22/2013  . Acute bronchitis 02/22/2013  . Lymphedema 05/24/2011  . RHINOSINUSITIS, CHRONIC 04/08/2010  . Obesity 08/04/2006  . DEPRESSIVE DISORDER, NOS 08/04/2006  . RHINITIS, ALLERGIC 08/04/2006   Voncille Lo, PT 02/03/2015 1:58 PM Phone: [phone] Fax: [facility] [place], [place], [address] Phone: [phone]   Fax:  [phone]

## 2015-02-03 NOTE — Patient Instructions (Signed)
EXTENSION: Standing - Resistance Band: Stable (Active)   Stand, right arm at side. Against yellow resistance band, draw arm backward, as far as possible, keeping elbow straight. Complete __2_ sets of _10__ repetitions. Perform  1-2___ sessions per day.  Copyright  VHI. All rights reserved.  Row: Mid-Range - Standing   http://ss.exer.us/290   Copyright  VHI. All rights reserved.  Resistive Band Rowing   With resistive band anchored in door, grasp both ends. Keeping elbows bent, pull back, squeezing shoulder blades together. Hold __3__ seconds. Repeat _10x2___ times. Do _1-2___ sessions per day.  http://gt2.exer.us/97   Copyright  VHI. All rights reserved.External Rotation (Eccentric), (Resistance Band)   Quickly pull band outward with both arms.  Keep elbows at sides, wrists neutral. Slowly return to start for 3-5 seconds. Use __red______ resistance band. Hint: Use towel roll between elbow and hip. __10_ reps per set, _2__ sets per day, __7_ days per week.  Copyright  VHI. All rights reserved.    Voncille Lo, PT 02/03/2015 1:20 PM Phone: [phone] Fax: [phone]

## 2015-02-05 NOTE — Progress Notes (Signed)
This encounter was created in error - please disregard.

## 2015-02-06 ENCOUNTER — Ambulatory Visit: Payer: Medicare Other | Attending: Physical Medicine & Rehabilitation | Admitting: Physical Therapy

## 2015-02-06 DIAGNOSIS — M6248 Contracture of muscle, other site: Secondary | ICD-10-CM | POA: Insufficient documentation

## 2015-02-06 DIAGNOSIS — M25611 Stiffness of right shoulder, not elsewhere classified: Secondary | ICD-10-CM | POA: Diagnosis present

## 2015-02-06 DIAGNOSIS — I890 Lymphedema, not elsewhere classified: Secondary | ICD-10-CM | POA: Diagnosis present

## 2015-02-06 DIAGNOSIS — E8989 Other postprocedural endocrine and metabolic complications and disorders: Secondary | ICD-10-CM | POA: Diagnosis present

## 2015-02-06 DIAGNOSIS — M542 Cervicalgia: Secondary | ICD-10-CM | POA: Diagnosis present

## 2015-02-06 DIAGNOSIS — M6281 Muscle weakness (generalized): Secondary | ICD-10-CM | POA: Diagnosis present

## 2015-02-06 DIAGNOSIS — R293 Abnormal posture: Secondary | ICD-10-CM | POA: Diagnosis present

## 2015-02-06 DIAGNOSIS — M5489 Other dorsalgia: Secondary | ICD-10-CM | POA: Insufficient documentation

## 2015-02-06 DIAGNOSIS — M7918 Myalgia, other site: Secondary | ICD-10-CM

## 2015-02-06 DIAGNOSIS — M62838 Other muscle spasm: Secondary | ICD-10-CM

## 2015-02-06 DIAGNOSIS — M797 Fibromyalgia: Secondary | ICD-10-CM | POA: Insufficient documentation

## 2015-02-06 NOTE — Therapy (Signed)
[place], [place], [address] Phone: [phone]   Fax:  [phone]  Physical Therapy Treatment  Patient Details  Name: Karen Hopkins MRN: [id number] Date of Birth: 07-05-1975 Referring Provider:  [name], MD  Encounter Date: 02/06/2015      PT End of Session - 02/06/15 1021    Visit Number 5   Number of Visits 12   Date for PT Re-Evaluation 02/21/15   Authorization Type Medicare KX   Authorization Time Period 01/08/15 to 9/ 16/16   PT Start Time 1017   PT Stop Time 1103   PT Time Calculation (min) 46 min   Activity Tolerance Patient tolerated treatment well   Behavior During Therapy [facility] for tasks assessed/performed      Past Medical History  Diagnosis Date  . Seasonal allergies   . Lymphedema of arm     right; no BP or puncture to right arm  . Depression   . Anxiety   . Sinus headache   . Hypertension     under control with med., has been on med. x 2 yr.  . History of breast cancer 2011    right  . History of chemotherapy 2011  . History of radiation therapy 2011  . Cancer     Past Surgical History  Procedure Laterality Date  . Supra-umbilical hernia  1517  . Nasal septum surgery    . Latissimus flap to breast Right 03/12/2013    Procedure: RIGHT BREAST LATISSIMUS FLAP WITH EXPANDER PLACEMENT;  Surgeon: Theodoro Kos, DO;  Location: [place];  Service: Plastics;  Laterality: Right;  . Cesarean section  07/15/2001; 03/18/2004; 11/21/2008  . Tubal ligation  11/21/2008  . Portacath placement Left 09/12/2009  . Modified radical mastectomy Right 03/11/2010  . Port-a-cath removal Left 12/01/2010  . Removal of tissue expander and placement of implant Right 08/23/2013    Procedure: REMOVAL RIGHT TISSUE EXPANDER AND PLACEMENT OF IMPLANT TO RIGHT BREAST ;  Surgeon: Theodoro Kos, DO;  Location: [place];  Service: Plastics;  Laterality: Right;  . Breast reduction surgery Left  08/23/2013    Procedure: LEFT BREAST REDUCTION  ;  Surgeon: Theodoro Kos, DO;  Location: [place];  Service: Plastics;  Laterality: Left;  . Liposuction Bilateral 08/23/2013    Procedure: LIPOSUCTION;  Surgeon: Theodoro Kos, DO;  Location: [place];  Service: Plastics;  Laterality: Bilateral;    There were no vitals filed for this visit.  Visit Diagnosis:  Myofascial muscle pain  Stiffness of joint, shoulder region, right  Muscle spasms of neck  Neck pain on right side  Right-sided back pain, unspecified location  Shoulder joint stiffness, right  Muscle weakness of right upper extremity  Abnormal posture  Lymphedema of upper extremity following lymphadenectomy      Subjective Assessment - 02/06/15 1022    Subjective I have been so busy getting the kids back and forth to school. I dont know what I did but my Right arm hurts so much I can barely lift it and bring across my buddy   Pertinent History Right breast cancer with mastectomy 2011; reconstruction; left breast reduction; h/o right UE lymphedema and right upper quadrant pain, treated in this clinic several times before.  Recent scare with left breeast, but but ultrasound and mammogram were negative.   Diagnostic tests mammogram, U/S;  another follow up with left breast due now   Patient Stated Goals decrease pain and  use arm regularly (right hand dominant)   Currently in Pain? Yes  when lifting my arm   Pain Score 6    Pain Location Shoulder  Upper trap  and teres major   Pain Orientation Right   Pain Descriptors / Indicators Aching   Pain Type Chronic pain   Multiple Pain Sites Yes   Pain Score 6   Pain Location Neck   Pain Orientation Right   Pain Descriptors / Indicators Aching   Pain Type Chronic pain   Pain Frequency Constant   Pain Location Other (Comment)  sternal pain/ pectoralis mx   Pain Orientation Right   Pain Descriptors / Indicators Aching   Pain Type Acute pain    Pain Onset In the past 7 days   Pain Frequency Constant   Aggravating Factors  lifting arm            [facility] PT Assessment - 02/06/15 1030    AROM   Right Shoulder Horizontal ABduction 18 Degrees  full range pain in sternum/pectoralis   Right Shoulder Horizontal  ADduction 12 Degrees  Pain restricted   Left Shoulder Horizontal ABduction 30 Degrees   Left Shoulder Horizontal ADduction 50 Degrees                     [facility] Adult PT Treatment/Exercise - 02/06/15 1029    Lumbar Exercises: Supine   Other Supine Lumbar Exercises towel roll on spine with paraspinal stretch and upper trunk rotation   Shoulder Exercises: Seated   Other Seated Exercises Pt performed shoulder flex with table supporting elbow fast 10 x flex, slow x 10 flex and hover 10 sec    Shoulder Exercises: Standing   Other Standing Exercises Standing UE Ranger at level 12,  for flex, horizontal abd, add and IR/ER x 10 each and  IR with side step wt shiffing x 10 reps   Other Standing Exercises wall clock at 4 and 5 o clock with left trunk rotation for R arm.  to pt pain tolerance /x 30 sec stretch x 3 each   Manual Therapy   Manual Therapy Soft tissue mobilization;Scapular mobilization;Myofascial release   Soft tissue mobilization upper trap, levator and rhomboids right side only   Myofascial Release Right pectoralis, Right teres major lateral scapular muscle using MFR and IASTYM tool   Scapular Mobilization scapular med/lat/sup/inf glides and distraction grade 3/4                PT Education - 02/06/15 1342    Education provided Yes   Education Details Reviewed former HEP given in clinic.  for stretching   Sparr(s) Educated Patient   Methods Explanation;Demonstration   Comprehension Verbalized understanding;Returned demonstration;Verbal cues required          PT Short Term Goals - 02/06/15 1349    PT SHORT TERM GOAL #1   Title Patient will report a 25% reduction in pain with usual  daily activities and home chores.   Baseline 6/10 pain initially 4/10 at end of treament   Time 3   Period Weeks   Status Partially Met   PT SHORT TERM GOAL #2   Title Patient will be able to resume a basic home ex program with minimal increase in pain    Time 3   Period Weeks   Status On-going   PT SHORT TERM GOAL #3   Title Right shoulder flexion improved to  115 degrees needed for reaching shoulder height shelves.   Baseline  110   Time 3   Period Weeks   Status Partially Met   PT SHORT TERM GOAL #4   Title Berg improved  10 points form baseline   Baseline 60   Time 3   Period Weeks   Status Unable to assess         Short Term Clinic Goals - 09/03/14 1217    CC Short Term Goal  #1   Title short term goals= long term goals          PT Long Term Goals - 01/27/15 1233    PT LONG TERM GOAL #1   Title Independent with progressive HEP for further ROM, strengthening of Right UE.   Baseline ongoing goal, pt with increased body swelling and seeing MD   Time 6   Period Weeks   Status On-going   PT LONG TERM GOAL #2   Title Right shoulder flexion and abduction AROM improved to 125 degrees needed for reaching eye level shelves in the kitchen.     Time 6   Period Weeks   Status On-going   PT LONG TERM GOAL #3   Title Right UE strength grossly 4-/5 needed for basic cooking/cleaning for her 3 children.     Time 14   Period Weeks   Status Partially Met   PT LONG TERM GOAL #4   Title FOTO improved 20 ppoints for indication of overall improved function   Baseline newest score 60    Time 6   Period Weeks   Status On-going   PT LONG TERM GOAL #5   Title Patient will report overall improvement in pain level 3/10 level or better  and improve  function at 50%.   Baseline Pain is sporadic, dry needling is helpful but pain is intermittentn   Time 6   Period Weeks   Status On-going   PT LONG TERM GOAL #6   Title Pt will be able to manage pain at night in order to sleep 4 or  more hours of restorative sleep. Pt will verbalize 3 pain management strategies for sleep/rest   Time 6   Period Weeks   Status On-going           Long Term Clinic Goals - 10/23/14 1149    CC Long Term Goal  #3   Title Right arm circumference at 15 cm. proximal to olecranon will be reduced at least 0.5 cm.   Status Achieved   CC Long Term Goal  #4   Title right arm circumference at 15 cm provximal to olecranon will be reduced by 3 sm   Status Partially Met            Plan - 02/06/15 1345    Clinical Impression Statement Pt 6/10 today and declined dry needling.  Pt presents with painful sternal muscular pain and difficulty flexing shoulder.  Pt reviewed HEP for stretching and used UE RAnger for initial exercise.  Pt has fascial restrictins due to reconstructive surgery.  Pt was educated on importance of movement and self MFR of tissues.  and pt should continiue exercise daily even if she is sore   Pt will benefit from skilled therapeutic intervention in order to improve on the following deficits Impaired UE functional use;Increased fascial restricitons;Increased edema;Impaired flexibility;Postural dysfunction;Obesity;Decreased scar mobility;Decreased range of motion   Rehab Potential Good   Clinical Impairments Affecting Rehab Potential multiple medical and chronic pain issues   PT Frequency 2x / week   PT Duration 6  weeks   PT Treatment/Interventions ADLs/Self Care Home Management;Patient/family education;Passive range of motion;Dry needling;Taping;Therapeutic exercise;Moist Heat;Cryotherapy   PT Next Visit Plan contiune Scapular mobs and encourage active stretching techniques   PT Home Exercise Plan HEP previously given and check goals /modify goals  Pt made aware of RX limitation   Consulted and Agree with Plan of Care Patient        Problem List Patient Active Problem List   Diagnosis Date Noted  . Type 2 diabetes mellitus without complication 76/72/0947  . Lower  extremity edema 10/01/2014  . Breast cancer, right breast 05/24/2014  . Myalgia and myositis 05/17/2014  . Neoplasm related pain 03/12/2014  . CN (constipation) 11/26/2013  . H/O reduction mammoplasty 10/30/2013  . Seizure 10/06/2013  . Status post bilateral breast implants 08/31/2013  . Acquired absence of breast and absent nipple 08/23/2013  . S/P breast reconstruction, right 08/23/2013  . AC joint arthropathy 07/02/2013  . Routine adult health maintenance 05/29/2013  . Hypokalemia 05/21/2013  . Chronic pain 04/19/2013  . Elevated blood sugar 03/09/2013  . Wheezing 02/22/2013  . Acute bronchitis 02/22/2013  . Lymphedema 05/24/2011  . RHINOSINUSITIS, CHRONIC 04/08/2010  . Obesity 08/04/2006  . DEPRESSIVE DISORDER, NOS 08/04/2006  . RHINITIS, ALLERGIC 08/04/2006   Voncille Lo, PT 02/06/2015 1:56 PM Phone: [phone] Fax: [facility] [address] [place], [place], [address] Phone: [phone]   Fax:  [phone]

## 2015-02-06 NOTE — Patient Instructions (Signed)
Continue to perform stretches as shown in previous treatments.  Reviewed in clinic former HEP

## 2015-02-11 ENCOUNTER — Ambulatory Visit: Payer: Medicare Other

## 2015-02-11 ENCOUNTER — Encounter: Payer: Medicare Other | Admitting: Podiatry

## 2015-02-11 ENCOUNTER — Ambulatory Visit: Payer: Medicare Other | Admitting: Physical Therapy

## 2015-02-11 DIAGNOSIS — E119 Type 2 diabetes mellitus without complications: Secondary | ICD-10-CM

## 2015-02-11 DIAGNOSIS — Z0189 Encounter for other specified special examinations: Secondary | ICD-10-CM

## 2015-02-11 NOTE — Progress Notes (Signed)
This encounter was created in error - please disregard.

## 2015-02-13 ENCOUNTER — Ambulatory Visit: Payer: Medicare Other | Admitting: Physical Therapy

## 2015-02-13 ENCOUNTER — Ambulatory Visit (HOSPITAL_BASED_OUTPATIENT_CLINIC_OR_DEPARTMENT_OTHER): Payer: Medicare Other

## 2015-02-13 ENCOUNTER — Encounter: Payer: Medicare Other | Admitting: Physical Therapy

## 2015-02-13 VITALS — BP 123/69 | HR 87 | Temp 98.9°F

## 2015-02-13 DIAGNOSIS — Z5111 Encounter for antineoplastic chemotherapy: Secondary | ICD-10-CM

## 2015-02-13 DIAGNOSIS — M62838 Other muscle spasm: Secondary | ICD-10-CM

## 2015-02-13 DIAGNOSIS — C50911 Malignant neoplasm of unspecified site of right female breast: Secondary | ICD-10-CM

## 2015-02-13 DIAGNOSIS — M797 Fibromyalgia: Secondary | ICD-10-CM | POA: Diagnosis not present

## 2015-02-13 DIAGNOSIS — E8989 Other postprocedural endocrine and metabolic complications and disorders: Secondary | ICD-10-CM

## 2015-02-13 DIAGNOSIS — M5489 Other dorsalgia: Secondary | ICD-10-CM

## 2015-02-13 DIAGNOSIS — M7918 Myalgia, other site: Secondary | ICD-10-CM

## 2015-02-13 DIAGNOSIS — M6281 Muscle weakness (generalized): Secondary | ICD-10-CM

## 2015-02-13 DIAGNOSIS — R293 Abnormal posture: Secondary | ICD-10-CM

## 2015-02-13 DIAGNOSIS — M542 Cervicalgia: Secondary | ICD-10-CM

## 2015-02-13 DIAGNOSIS — M25611 Stiffness of right shoulder, not elsewhere classified: Secondary | ICD-10-CM

## 2015-02-13 DIAGNOSIS — I890 Lymphedema, not elsewhere classified: Secondary | ICD-10-CM

## 2015-02-13 MED ORDER — GOSERELIN ACETATE 3.6 MG ~~LOC~~ IMPL
3.6000 mg | DRUG_IMPLANT | Freq: Once | SUBCUTANEOUS | Status: AC
  Administered 2015-02-13: 3.6 mg via SUBCUTANEOUS
  Filled 2015-02-13: qty 3.6

## 2015-02-13 NOTE — Therapy (Signed)
[place], [place], [address] Phone: [phone]   Fax:  [phone]  Physical Therapy Treatment  Patient Details  Name: Karen Hopkins MRN: [id number] Date of Birth: September 17, 1975 Referring Provider:  [name], MD  Encounter Date: 02/13/2015      PT End of Session - 02/13/15 1005    Visit Number 6   Number of Visits 12   Date for PT Re-Evaluation 02/21/15   Authorization Type Medicare KX   Authorization Time Period 01/08/15 to 9/ 16/16   PT Start Time 1003   PT Stop Time 1105   PT Time Calculation (min) 62 min   Activity Tolerance Patient tolerated treatment well   Behavior During Therapy [facility] for tasks assessed/performed      Past Medical History  Diagnosis Date  . Seasonal allergies   . Lymphedema of arm     right; no BP or puncture to right arm  . Depression   . Anxiety   . Sinus headache   . Hypertension     under control with med., has been on med. x 2 yr.  . History of breast cancer 2011    right  . History of chemotherapy 2011  . History of radiation therapy 2011  . Cancer     Past Surgical History  Procedure Laterality Date  . Supra-umbilical hernia  9735  . Nasal septum surgery    . Latissimus flap to breast Right 03/12/2013    Procedure: RIGHT BREAST LATISSIMUS FLAP WITH EXPANDER PLACEMENT;  Surgeon: Theodoro Kos, DO;  Location: [place];  Service: Plastics;  Laterality: Right;  . Cesarean section  07/15/2001; 03/18/2004; 11/21/2008  . Tubal ligation  11/21/2008  . Portacath placement Left 09/12/2009  . Modified radical mastectomy Right 03/11/2010  . Port-a-cath removal Left 12/01/2010  . Removal of tissue expander and placement of implant Right 08/23/2013    Procedure: REMOVAL RIGHT TISSUE EXPANDER AND PLACEMENT OF IMPLANT TO RIGHT BREAST ;  Surgeon: Theodoro Kos, DO;  Location: [place];  Service: Plastics;  Laterality: Right;  . Breast reduction surgery Left  08/23/2013    Procedure: LEFT BREAST REDUCTION  ;  Surgeon: Theodoro Kos, DO;  Location: [place];  Service: Plastics;  Laterality: Left;  . Liposuction Bilateral 08/23/2013    Procedure: LIPOSUCTION;  Surgeon: Theodoro Kos, DO;  Location: [place];  Service: Plastics;  Laterality: Bilateral;    There were no vitals filed for this visit.  Visit Diagnosis:  Myofascial muscle pain  Lymphedema of upper extremity following lymphadenectomy  Stiffness of joint, shoulder region, right  Muscle spasms of neck  Neck pain on right side  Right-sided back pain, unspecified location  Shoulder joint stiffness, right  Muscle weakness of right upper extremity  Abnormal posture      Subjective Assessment - 02/13/15 1008    Subjective I feel like I have a head cold and I have allergies so I feel swollen around my neck,  My right arm sitll hurts   Diagnostic tests mammogram, U/S;  another follow up with left breast due now   Patient Stated Goals decrease pain and use arm regularly (right hand dominant)   Currently in Pain? Yes   Pain Score 8    Pain Location Shoulder   Pain Orientation Right   Pain Descriptors / Indicators Aching   Pain Type Chronic pain   Pain Onset More than a month ago   Pain Frequency  Intermittent  pain when lifting arm   Aggravating Factors  moving my arm at shoulder level   Pain Relieving Factors dry needling and manual therapy   Pain Score 8   Pain Location Neck   Pain Orientation Right   Pain Descriptors / Indicators Aching   Pain Type Chronic pain   Pain Frequency Constant   Pain Score 8   Pain Location Other (Comment)  sternal pain pectoralis mx Right   Pain Orientation Right   Pain Onset 1 to 4 weeks ago   Pain Frequency Constant            [facility] PT Assessment - 02/13/15 1038    AROM   Right Shoulder Extension 30 Degrees  passive 42 degrees   Right Shoulder Flexion 110 Degrees  120 degrees passive discomfort    Right Shoulder ABduction 110 Degrees  passive 122 degrees   Right Shoulder Internal Rotation 55 Degrees  starts compensating at 55 degrees   Right Shoulder External Rotation 75 Degrees  passive full 92    Right Shoulder Horizontal ABduction 18 Degrees  full range pain in sternum/pectoralis   Right Shoulder Horizontal  ADduction 12 Degrees  Pain restricted   Left Shoulder Horizontal ABduction 30 Degrees   Left Shoulder Horizontal ADduction 50 Degrees   Cervical Flexion 45   Cervical Extension 60   Cervical - Right Side Bend 40   Cervical - Left Side Bend 50   Cervical - Right Rotation 65   Cervical - Left Rotation 65   Strength   Right Shoulder Flexion 4+/5   Right Shoulder Extension 4+/5   Right Shoulder ABduction 4/5   Right Shoulder Internal Rotation 4+/5   Right Shoulder External Rotation 4/5                     [facility] Adult PT Treatment/Exercise - 02/13/15 1038    Ambulation/Gait   Ambulation/Gait Yes   Ambulation Distance (Feet) 300 Feet   Assistive device None   Gait Comments Pt educated on arm swing and needed VC for correct sequence R arm/Left leg etc    Posture/Postural Control   Posture/Postural Control Postural limitations   Postural Limitations Rounded Shoulders;Forward head;Increased thoracic kyphosis   Posture Comments used KT tape for posture awareness  , pt compensates with R ight upper trap   Shoulder Exercises: Standing   Other Standing Exercises Wall slides with LEft and right stepping/ kinetic chain activation x 20 each   Other Standing Exercises PNF diagonals with blue T band, wall clock,  finger ladder to level 18 without pain.     Shoulder Exercises: Pulleys   Other Pulley Exercises Blue T band in door with side standing and bil arm flex and elbow exte and flex while maintaining errect posture x 20   Manual Therapy   Manual Therapy Soft tissue mobilization;Scapular mobilization;Myofascial release   Soft tissue mobilization upper trap,  levator and rhomboids and pectoralis right side only   Myofascial Release Right pectoralis, Right teres major lateral scapular muscle using MFR and IASTYM tool   Scapular Mobilization scapular med/lat/sup/inf glides and distraction grade 3/4   Kinesiotex Inhibit Muscle;Facilitate Muscle   Kinesiotix   Inhibit Muscle  upper trap right   Facilitate Muscle  rhomboids          Trigger Point Dry Needling - 02/13/15 1035    Consent Given? Yes   Muscles Treated Upper Body Upper trapezius;Levator scapulae;Rhomboids  C-3 to T -1   Upper Trapezius  Response Twitch reponse elicited;Palpable increased muscle length   Levator Scapulae Response Twitch response elicited;Palpable increased muscle length   Rhomboids Response Twitch response elicited;Palpable increased muscle length              PT Education - 02/13/15 1115    Education provided Yes   Education Details Pt educated on arm swing with gait and given blue T band for shoulder exercises and instructed in full kinetic chain engagement using wall slided   Leavell(s) Educated Patient   Methods Explanation;Demonstration;Handout;Verbal cues   Comprehension Verbalized understanding;Returned demonstration          PT Short Term Goals - 02/13/15 1131    PT SHORT TERM GOAL #1   Title Patient will report a 25% reduction in pain with usual daily activities and home chores.   Baseline 8/10 initially and 4/10 after treament   Time 3   Period Weeks   Status Partially Met   PT SHORT TERM GOAL #2   Title Patient will be able to resume a basic home ex program with minimal increase in pain    Baseline pain tends to fluctuate due to R upper trap compensatory spasms   Time 3   Period Weeks   Status Partially Met   PT SHORT TERM GOAL #3   Title Right shoulder flexion improved to  115 degrees needed for reaching shoulder height shelves.   Baseline , 110   Time 3   Status Partially Met   PT SHORT TERM GOAL #4   Title Berg improved  10  points form baseline   Time 3   Period Weeks   Status Unable to assess         Short Term Clinic Goals - 09/03/14 1217    CC Short Term Goal  #1   Title short term goals= long term goals          PT Long Term Goals - 02/13/15 1133    PT LONG TERM GOAL #1   Title Independent with progressive HEP for further ROM, strengthening of Right UE.   Time 6   Period Weeks   Status On-going   PT LONG TERM GOAL #2   Title Right shoulder flexion and abduction AROM improved to 125 degrees needed for reaching eye level shelves in the kitchen.     Time 6   Period Weeks   Status On-going   PT LONG TERM GOAL #3   Title Right UE strength grossly 4-/5 needed for basic cooking/cleaning for her 3 children.     Time 14   Period Weeks   Status Achieved   PT LONG TERM GOAL #4   Title FOTO improved 20 ppoints for indication of overall improved function   Time 6   Period Weeks   Status On-going   PT LONG TERM GOAL #5   Title Patient will report overall improvement in pain level 3/10 level or better  and improve  function at 50%.   Baseline Pain is sporadic, dry needling is helpful but pain is intermittentn at best 4/10   Time 6   Period Weeks   Status On-going   PT LONG TERM GOAL #6   Title Pt will be able to manage pain at night in order to sleep 4 or more hours of restorative sleep. Pt will verbalize 3 pain management strategies for sleep/rest   Time 6   Period Weeks   Status On-going           Long  Term Clinic Goals - 10/23/14 1149    CC Long Term Goal  #3   Title Right arm circumference at 15 cm. proximal to olecranon will be reduced at least 0.5 cm.   Status Achieved   CC Long Term Goal  #4   Title right arm circumference at 15 cm provximal to olecranon will be reduced by 3 sm   Status Partially Met            Plan - 02/13/15 1127    Clinical Impression Statement Pt with 8/10 pain at beginning of session and reduced to 4/10 at end of session.  Pt compensates with Upper  trap due to reconstructive use of lat muscle, so she is aware she needs to work on posture and stretches consistently.   AROM is improved 110 R shoulder flex and cervical flexion to 45 degrees and Ri side bend 35  degrees but all else remains stable.   Pt had allergies and felt swollen in neck and was initially very tight in R pectorlais muscle but myofascial stretch reduced trigger point pain.    Pt has 2 additioanl visit to finalize HEP for home use and will Dry needlle  as necessary for comfort   Pt will benefit from skilled therapeutic intervention in order to improve on the following deficits Impaired UE functional use;Increased fascial restricitons;Increased edema;Impaired flexibility;Postural dysfunction;Obesity;Decreased scar mobility;Decreased range of motion   Rehab Potential Good   Clinical Impairments Affecting Rehab Potential multiple medical and chronic pain issues   PT Frequency 2x / week   PT Duration 6 weeks   PT Treatment/Interventions ADLs/Self Care Home Management;Patient/family education;Passive range of motion;Dry needling;Taping;Therapeutic exercise;Moist Heat;Cryotherapy   PT Next Visit Plan BERG this vist . Core for HEP    PT Home Exercise Plan added kinetic chain and wall slide with R UE and    Consulted and Agree with Plan of Care Patient        Problem List Patient Active Problem List   Diagnosis Date Noted  . Type 2 diabetes mellitus without complication 77/93/9030  . Lower extremity edema 10/01/2014  . Breast cancer, right breast 05/24/2014  . Myalgia and myositis 05/17/2014  . Neoplasm related pain 03/12/2014  . CN (constipation) 11/26/2013  . H/O reduction mammoplasty 10/30/2013  . Seizure 10/06/2013  . Status post bilateral breast implants 08/31/2013  . Acquired absence of breast and absent nipple 08/23/2013  . S/P breast reconstruction, right 08/23/2013  . AC joint arthropathy 07/02/2013  . Routine adult health maintenance 05/29/2013  . Hypokalemia  05/21/2013  . Chronic pain 04/19/2013  . Elevated blood sugar 03/09/2013  . Wheezing 02/22/2013  . Acute bronchitis 02/22/2013  . Lymphedema 05/24/2011  . RHINOSINUSITIS, CHRONIC 04/08/2010  . Obesity 08/04/2006  . DEPRESSIVE DISORDER, NOS 08/04/2006  . RHINITIS, ALLERGIC 08/04/2006    Voncille Lo, PT 02/13/2015 11:40 AM Phone: [phone] Fax: [facility] [place], [place], [address] Phone: [phone]   Fax:  [phone]

## 2015-02-13 NOTE — Patient Instructions (Signed)
Pt given handout for Wall slides with with Right arm flexed and lower extremity wt shift on Right and then Left leg.   See handout.  Worked on arm swing with opposite arm and leg during walking.  Use Blue theraband and stand sideways as you extend and flex elbow and shoulders as shown in clinic/ Maintain a narrow base of support for your feet for more challenge.  Voncille Lo, PT 02/13/2015 11:11 AM Phone: [phone] Fax: [phone]

## 2015-02-17 ENCOUNTER — Ambulatory Visit: Payer: Medicare Other | Admitting: Physical Therapy

## 2015-02-17 ENCOUNTER — Other Ambulatory Visit: Payer: Self-pay | Admitting: Oncology

## 2015-02-19 ENCOUNTER — Encounter: Payer: Medicare Other | Admitting: Physical Therapy

## 2015-02-20 ENCOUNTER — Ambulatory Visit: Payer: Medicare Other | Admitting: Physical Therapy

## 2015-02-20 DIAGNOSIS — I890 Lymphedema, not elsewhere classified: Secondary | ICD-10-CM

## 2015-02-20 DIAGNOSIS — M25611 Stiffness of right shoulder, not elsewhere classified: Secondary | ICD-10-CM

## 2015-02-20 DIAGNOSIS — M62838 Other muscle spasm: Secondary | ICD-10-CM

## 2015-02-20 DIAGNOSIS — M5489 Other dorsalgia: Secondary | ICD-10-CM

## 2015-02-20 DIAGNOSIS — E8989 Other postprocedural endocrine and metabolic complications and disorders: Secondary | ICD-10-CM

## 2015-02-20 DIAGNOSIS — M542 Cervicalgia: Secondary | ICD-10-CM

## 2015-02-20 DIAGNOSIS — M6281 Muscle weakness (generalized): Secondary | ICD-10-CM

## 2015-02-20 DIAGNOSIS — R293 Abnormal posture: Secondary | ICD-10-CM

## 2015-02-20 DIAGNOSIS — M7918 Myalgia, other site: Secondary | ICD-10-CM

## 2015-02-20 DIAGNOSIS — M797 Fibromyalgia: Secondary | ICD-10-CM | POA: Diagnosis not present

## 2015-02-20 NOTE — Therapy (Signed)
[place], [place], [address] Phone: [phone]   Fax:  [phone]  Physical Therapy Treatment  Patient Details  Name: Karen Hopkins MRN: [id number] Date of Birth: August 15, 1975 Referring Provider:  [name], MD  Encounter Date: 02/20/2015      PT End of Session - 02/20/15 0935    Visit Number 7   Number of Visits 13   Date for PT Re-Evaluation 04/03/15   Authorization Type Medicare KX   Authorization Time Period 01-08-15 to 04-03-15   Authorization - Number of Visits 13   PT Start Time 0932   PT Stop Time 1030   PT Time Calculation (min) 58 min   Activity Tolerance Patient tolerated treatment well   Behavior During Therapy [facility] for tasks assessed/performed      Past Medical History  Diagnosis Date  . Seasonal allergies   . Lymphedema of arm     right; no BP or puncture to right arm  . Depression   . Anxiety   . Sinus headache   . Hypertension     under control with med., has been on med. x 2 yr.  . History of breast cancer 2011    right  . History of chemotherapy 2011  . History of radiation therapy 2011  . Cancer     Past Surgical History  Procedure Laterality Date  . Supra-umbilical hernia  1941  . Nasal septum surgery    . Latissimus flap to breast Right 03/12/2013    Procedure: RIGHT BREAST LATISSIMUS FLAP WITH EXPANDER PLACEMENT;  Surgeon: Theodoro Kos, DO;  Location: [facility];  Service: Plastics;  Laterality: Right;  . Cesarean section  07/15/2001; 03/18/2004; 11/21/2008  . Tubal ligation  11/21/2008  . Portacath placement Left 09/12/2009  . Modified radical mastectomy Right 03/11/2010  . Port-a-cath removal Left 12/01/2010  . Removal of tissue expander and placement of implant Right 08/23/2013    Procedure: REMOVAL RIGHT TISSUE EXPANDER AND PLACEMENT OF IMPLANT TO RIGHT BREAST ;  Surgeon: Theodoro Kos, DO;  Location: [facility];  Service: Plastics;  Laterality:  Right;  . Breast reduction surgery Left 08/23/2013    Procedure: LEFT BREAST REDUCTION  ;  Surgeon: Theodoro Kos, DO;  Location: [facility];  Service: Plastics;  Laterality: Left;  . Liposuction Bilateral 08/23/2013    Procedure: LIPOSUCTION;  Surgeon: Theodoro Kos, DO;  Location: [facility];  Service: Plastics;  Laterality: Bilateral;    There were no vitals filed for this visit.  Visit Diagnosis:  Myofascial muscle pain  Lymphedema of upper extremity following lymphadenectomy  Stiffness of joint, shoulder region, right  Muscle spasms of neck  Neck pain on right side  Right-sided back pain, unspecified location  Shoulder joint stiffness, right  Muscle weakness of right upper extremity  Abnormal posture      Subjective Assessment - 02/20/15 0935    Subjective I am gettin over  my cold.  I have had such a time with my transportation and getting my children to school and to here in time.  I really benefit from dry needling and therapy and I hate to miss my appointments   Pertinent History Right breast cancer with mastectomy 2011; reconstruction; left breast reduction; h/o right UE lymphedema and right upper quadrant pain, treated in this clinic several times before.  Recent scare with left breeast, but but ultrasound and mammogram were negative.   Limitations House hold activities  How long can you sit comfortably? numbness    How long can you stand comfortably? 30 min   How long can you walk comfortably? 30 min   Diagnostic tests mammogram, U/S;  another follow up with left breast due now   Patient Stated Goals decrease pain and use arm regularly (right hand dominant) and be able to handle my pain at home    Currently in Pain? Yes   Pain Score 6    Pain Location Shoulder   Pain Orientation Right   Pain Descriptors / Indicators Aching   Pain Type Chronic pain   Pain Onset More than a month ago   Pain Frequency Intermittent   Aggravating  Factors  moving my arm at shoulder level   Pain Relieving Factors dry needling , manual thereapy   Pain Score 6   Pain Location Neck   Pain Orientation Right   Pain Descriptors / Indicators Aching   Pain Type Chronic pain   Pain Frequency Constant   Pain Score 4   Pain Descriptors / Indicators Aching   Pain Type Chronic pain   Pain Onset More than a month ago   Pain Frequency Constant            [facility] PT Assessment - 02/20/15 0944    AROM   Right Shoulder Extension 30 Degrees  passive 42 degrees   Right Shoulder Flexion 112 Degrees  120 degrees passive discomfort   Right Shoulder ABduction 110 Degrees  passive 122 degrees   Right Shoulder Internal Rotation 55 Degrees  starts compensating at 55 degrees   Right Shoulder External Rotation 75 Degrees  passive full 92    Right Shoulder Horizontal ABduction 18 Degrees  full range pain in sternum/pectoralis   Right Shoulder Horizontal  ADduction 12 Degrees  Pain restricted   Left Shoulder Horizontal ABduction 50 Degrees   Left Shoulder Horizontal ADduction 50 Degrees   Cervical Flexion 45   Cervical Extension 60   Cervical - Right Side Bend 40   Cervical - Left Side Bend 50   Cervical - Right Rotation 65   Cervical - Left Rotation 65   Strength   Right Shoulder Flexion 4+/5   Right Shoulder Extension 4+/5   Right Shoulder ABduction 4/5   Right Shoulder Internal Rotation 4+/5   Right Shoulder External Rotation 4/5   Berg Balance Test   Sit to Stand Able to stand without using hands and stabilize independently   Standing Unsupported Able to stand safely 2 minutes   Sitting with Back Unsupported but Feet Supported on Floor or Stool Able to sit safely and securely 2 minutes   Stand to Sit Sits safely with minimal use of hands   Transfers Able to transfer safely, minor use of hands   Standing Unsupported with Eyes Closed Able to stand 10 seconds safely   Standing Ubsupported with Feet Together Able to place feet together  independently and stand 1 minute safely   From Standing, Reach Forward with Outstretched Arm Can reach forward >12 cm safely (5")   From Standing Position, Pick up Object from Floor Unable to pick up shoe, but reaches 2-5 cm (1-2") from shoe and balances independently   From Standing Position, Turn to Look Behind Over each Shoulder Looks behind one side only/other side shows less weight shift   Turn 360 Degrees Able to turn 360 degrees safely in 4 seconds or less   Standing Unsupported, Alternately Place Feet on Step/Stool Able to stand independently and  safely and complete 8 steps in 20 seconds   Standing Unsupported, One Foot in Front Able to plae foot ahead of the other independently and hold 30 seconds   Standing on One Leg Able to lift leg independently and hold equal to or more than 3 seconds   Total Score 49   Berg comment: Pt has decrease in use of Right arm                     [facility] Adult PT Treatment/Exercise - 02/20/15 0951    Shoulder Exercises: Prone   Other Prone Exercises self MFR with tennis ball to pecs   Moist Heat Therapy   Number Minutes Moist Heat 15 Minutes   Moist Heat Location Cervical   Manual Therapy   Manual Therapy Soft tissue mobilization;Scapular mobilization;Myofascial release   Soft tissue mobilization upper trap , levator and rhomboid Right only   Myofascial Release Right pectoralis, Right teres major lateral scapular muscle using MFR and IASTYM tool   Scapular Mobilization scapular med/lat/sup/inf glides and distraction grade 3/4   Other Manual Therapy Pt instructed in use of tennis ball for self myofascial release techniique over pectoral, teres major, upper trap  with handouts provided my PT on self myofascial release          Trigger Point Dry Needling - 02/20/15 0954    Consent Given? Yes   Muscles Treated Upper Body Upper trapezius;Levator scapulae;Rhomboids  C-3 to C-6 R erector spinae              PT Education - 02/20/15  0932    Education provided Yes   Education Details Pt educated on self myofascial release technique using tennis ball and need for consistent use between treaments.Marland Kitchen  given handout by PT   Lage(s) Educated Patient   Methods Explanation;Demonstration;Handout;Verbal cues   Comprehension Verbalized understanding;Returned demonstration          PT Short Term Goals - 02/20/15 1407    PT SHORT TERM GOAL #1   Title Patient will report a 25% reduction in pain with usual daily activities and home chores.   Baseline fluctuates from 4/10 to 8/10   Time 6   Period Weeks   Status Partially Met   PT SHORT TERM GOAL #2   Title Patient will be able to resume a basic home ex program with minimal increase in pain    Baseline pain tends to fluctuate due to R upper trap compensatory spasms   Time 6   Period Weeks   Status Partially Met   PT SHORT TERM GOAL #3   Title Right shoulder flexion improved to  115 degrees needed for reaching shoulder height shelves.   Baseline 112 AROM   120 passive   Time 6   Period Weeks   Status Partially Met   PT SHORT TERM GOAL #4   Title Berg improved  10 points form baseline   Baseline 49/56   Time 6   Period Weeks   Status On-going         Short Term Clinic Goals - 09/03/14 1217    CC Short Term Goal  #1   Title short term goals= long term goals          PT Long Term Goals - 02/20/15 1409    PT LONG TERM GOAL #1   Title Independent with progressive HEP for further ROM, strengthening of Right UE.   Baseline ongoing goal, pt with increased body swelling and  seeing MD   Time 12   Period Weeks   Status On-going   PT LONG TERM GOAL #2   Title Right shoulder flexion and abduction AROM improved to 125 degrees needed for reaching eye level shelves in the kitchen.     Baseline 112 max AROM   Time 12   Period Weeks   Status On-going   PT LONG TERM GOAL #3   Title Right UE strength grossly 4-/5 needed for basic cooking/cleaning for her 3 children.      Time 12   Period Weeks   Status Achieved   PT LONG TERM GOAL #4   Title FOTO improved 20 ppoints for indication of overall improved function   Time 12   Period Weeks   Status Unable to assess   PT LONG TERM GOAL #5   Title Patient will report overall improvement in pain level 3/10 level or better  and improve  function at 50%.   Baseline Pain is sporadic, dry needling is helpful but pain is intermittentn at best 4/10   Time 12   Period Weeks   Status On-going   Additional Long Term Goals   Additional Long Term Goals Yes   PT LONG TERM GOAL #6   Title Pt will be able to manage pain at night in order to sleep 4 or more hours of restorative sleep. Pt will verbalize 3 pain management strategies for sleep/rest   Time 12   Period Weeks   Status On-going   PT LONG TERM GOAL #7   Title Pt will incorporate walking program in to daily life to encourage aerobic exercise for health  Pt to work up to 1/4 a mile   Time 12   [place] - 10/23/14 1149    CC Long Term Goal  #3   Title Right arm circumference at 15 cm. proximal to olecranon will be reduced at least 0.5 cm.   Status Achieved   CC Long Term Goal  #4   Title right arm circumference at 15 cm provximal to olecranon will be reduced by 3 sm   Status Partially Met            Plan - 02/20/15 1354    Clinical Impression Statement Pt at 6/10 pain today and coming from 8/10 last visit.  Pt has not been able to be as consistent with apppointments due to transportation issue and childcare.  Pt ends with 4/10 pain level after visit.  Pt was educated on importance of home care and  self myofascial release in order to maintain the lengthening of muscle and avoid spasm.  Pt compenstes with Upper trap due to reconstructive use of latissimus mx , so she is aware of her compensations.  AROM cervcal and R shoulder  has improved slightly ( see PT assessment).  Pt recieves  relief and  decreased in pain but never more thatn 4/10.   PT would like to see pt  be consistent with community wellness program which would include aerobic conditioning.  Pt  BERG balance score 49/56 due to inablituy to atnd on one leg  for no more than 3 sec.  Pt is generally deconditioned.  Pt would benefit from 6 additional visits for dry needling and to include a core exercise program and walking program in order to promote all over health.    Pt will benefit from skilled  therapeutic intervention in order to improve on the following deficits Impaired UE functional use;Increased fascial restricitons;Increased edema;Impaired flexibility;Postural dysfunction;Obesity;Decreased scar mobility;Decreased range of motion   Rehab Potential Good   Clinical Impairments Affecting Rehab Potential multiple medical and chronic pain issues   PT Frequency 1x / week   PT Duration 6 weeks  additional 6 weeks   PT Treatment/Interventions ADLs/Self Care Home Management;Patient/family education;Passive range of motion;Dry needling;Taping;Therapeutic exercise;Moist Heat;Cryotherapy   PT Next Visit Plan PLease have pt fill out FOTO next visit   PT Home Exercise Plan Added self myofascial release using Tennis ball to trigger point areas of R shoulder   Consulted and Agree with Plan of Care Patient        Problem List Patient Active Problem List   Diagnosis Date Noted  . Type 2 diabetes mellitus without complication 65/99/3570  . Lower extremity edema 10/01/2014  . Breast cancer, right breast 05/24/2014  . Myalgia and myositis 05/17/2014  . Neoplasm related pain 03/12/2014  . CN (constipation) 11/26/2013  . H/O reduction mammoplasty 10/30/2013  . Seizure 10/06/2013  . Status post bilateral breast implants 08/31/2013  . Acquired absence of breast and absent nipple 08/23/2013  . S/P breast reconstruction, right 08/23/2013  . AC joint arthropathy 07/02/2013  . Routine adult health maintenance 05/29/2013  . Hypokalemia  05/21/2013  . Chronic pain 04/19/2013  . Elevated blood sugar 03/09/2013  . Wheezing 02/22/2013  . Acute bronchitis 02/22/2013  . Lymphedema 05/24/2011  . RHINOSINUSITIS, CHRONIC 04/08/2010  . Obesity 08/04/2006  . DEPRESSIVE DISORDER, NOS 08/04/2006  . RHINITIS, ALLERGIC 08/04/2006    Voncille Lo, PT 02/20/2015 2:18 PM Phone: [phone] Fax: [facility] [address] [place], [place], [address] Phone: [phone]   Fax:  [phone]

## 2015-03-04 ENCOUNTER — Encounter: Payer: Medicare Other | Attending: Registered Nurse | Admitting: Registered Nurse

## 2015-03-04 ENCOUNTER — Encounter: Payer: Self-pay | Admitting: Registered Nurse

## 2015-03-04 VITALS — BP 129/69 | HR 97

## 2015-03-04 DIAGNOSIS — G893 Neoplasm related pain (acute) (chronic): Secondary | ICD-10-CM | POA: Diagnosis not present

## 2015-03-04 DIAGNOSIS — M791 Myalgia: Secondary | ICD-10-CM

## 2015-03-04 DIAGNOSIS — M7918 Myalgia, other site: Secondary | ICD-10-CM

## 2015-03-04 DIAGNOSIS — M545 Low back pain, unspecified: Secondary | ICD-10-CM

## 2015-03-04 DIAGNOSIS — Z79899 Other long term (current) drug therapy: Secondary | ICD-10-CM

## 2015-03-04 DIAGNOSIS — Z5181 Encounter for therapeutic drug level monitoring: Secondary | ICD-10-CM

## 2015-03-04 DIAGNOSIS — I890 Lymphedema, not elsewhere classified: Secondary | ICD-10-CM

## 2015-03-04 DIAGNOSIS — G894 Chronic pain syndrome: Secondary | ICD-10-CM

## 2015-03-04 DIAGNOSIS — M546 Pain in thoracic spine: Secondary | ICD-10-CM | POA: Insufficient documentation

## 2015-03-04 MED ORDER — NAPROXEN 500 MG PO TABS: ORAL_TABLET | ORAL | Status: DC

## 2015-03-04 MED ORDER — OXYCODONE HCL 10 MG PO TABS: 10.0000 mg | ORAL_TABLET | Freq: Three times a day (TID) | ORAL | Status: DC | PRN

## 2015-03-04 NOTE — Progress Notes (Signed)
Subjective:    Patient ID: Karen Hopkins, female    DOB: 25-Jan-1976, 39 y.o.   MRN: [id number]  HPI: [name] is a 39 year old female who returns for follow up for chronic pain and medication refill. She says her pain is located in her neck,right shoulder and upper back.She rates her pain 6. Her current exercise regime is walking, performing stretching exercises and attending Physical Therapy for Dry Needling.  Pain Inventory Average Pain 7 Pain Right Now 6 My pain is sharp, burning, dull, stabbing, tingling and aching  In the last 24 hours, has pain interfered with the following? General activity 6 Relation with others 6 Enjoyment of life 6 What TIME of day is your pain at its worst? morning  daytime and night Sleep (in general) NA  Pain is worse with: some activites Pain improves with: medication Relief from Meds: NA  Mobility how many minutes can you walk? 10 ability to climb steps?  yes do you drive?  yes  Function disabled: date disabled .  Neuro/Psych numbness tingling trouble walking dizziness confusion depression anxiety  Prior Studies Any changes since last visit?  no  Physicians involved in your care Any changes since last visit?  no   Family History  Problem Relation Age of Onset  . Hypertension Mother   . Diabetes type II Father   . Prostate cancer Father    Social History   Social History  . Marital Status: Single    Spouse Name: N/A  . Number of Children: N/A  . Years of Education: N/A   Social History Main Topics  . Smoking status: Former Smoker -- 0.25 packs/day for 5 years    Quit date: 04/26/2010  . Smokeless tobacco: Never Used  . Alcohol Use: No  . Drug Use: No  . Sexual Activity: Not Currently    Birth Control/ Protection: Surgical   Other Topics Concern  . None   Social History Narrative   Past Surgical History  Procedure Laterality Date  . Supra-umbilical hernia  0354  . Nasal septum surgery    .  Latissimus flap to breast Right 03/12/2013    Procedure: RIGHT BREAST LATISSIMUS FLAP WITH EXPANDER PLACEMENT;  Surgeon: Theodoro Kos, DO;  Location: [facility];  Service: Plastics;  Laterality: Right;  . Cesarean section  07/15/2001; 03/18/2004; 11/21/2008  . Tubal ligation  11/21/2008  . Portacath placement Left 09/12/2009  . Modified radical mastectomy Right 03/11/2010  . Port-a-cath removal Left 12/01/2010  . Removal of tissue expander and placement of implant Right 08/23/2013    Procedure: REMOVAL RIGHT TISSUE EXPANDER AND PLACEMENT OF IMPLANT TO RIGHT BREAST ;  Surgeon: Theodoro Kos, DO;  Location: [facility];  Service: Plastics;  Laterality: Right;  . Breast reduction surgery Left 08/23/2013    Procedure: LEFT BREAST REDUCTION  ;  Surgeon: Theodoro Kos, DO;  Location: [facility];  Service: Plastics;  Laterality: Left;  . Liposuction Bilateral 08/23/2013    Procedure: LIPOSUCTION;  Surgeon: Theodoro Kos, DO;  Location: [facility];  Service: Plastics;  Laterality: Bilateral;   Past Medical History  Diagnosis Date  . Seasonal allergies   . Lymphedema of arm     right; no BP or puncture to right arm  . Depression   . Anxiety   . Sinus headache   . Hypertension     under control with med., has been on med. x 2 yr.  . History of breast  cancer 2011    right  . History of chemotherapy 2011  . History of radiation therapy 2011  . Cancer    BP 129/69 mmHg  Pulse 97  SpO2 98%  Opioid Risk Score:   Fall Risk Score:  `1  Depression screen PHQ 2/9  Depression screen [facility] 2/9 03/04/2015 01/20/2015 01/07/2015 11/21/2014 10/07/2014 10/01/2014 09/13/2014  Decreased Interest 0 0 0 0 0 0 0  Down, Depressed, Hopeless 0 0 0 0 0 0 0  PHQ - 2 Score 0 0 0 0 0 0 0  Altered sleeping - - - - - - -  Tired, decreased energy - - - - - - -  Change in appetite - - - - - - -  Feeling bad or failure about yourself  - - - - - - -  Trouble concentrating - - - - - - -  Moving  slowly or fidgety/restless - - - - - - -  Suicidal thoughts - - - - - - -  PHQ-9 Score - - - - - - -     Review of Systems  Musculoskeletal: Positive for gait problem.       Spasms  Neurological: Positive for dizziness and numbness.       Tingling  Psychiatric/Behavioral: Positive for confusion and dysphoric mood. The patient is nervous/anxious.   All other systems reviewed and are negative.      Objective:   Physical Exam  Constitutional: She is oriented to [place], place, and time. She appears well-developed and well-nourished.  HENT:  Head: Normocephalic and atraumatic.  Neck: Normal range of motion. Neck supple.  Cervical Paraspinal Tenderness: C-5- C-6  Cardiovascular: Normal rate and regular rhythm.   Pulmonary/Chest: Effort normal and breath sounds normal.  Musculoskeletal: She exhibits edema.  Normal Muscle Bulk and muscle Testing Reveals: Upper Extremities: Right: Decreased ROM 45 Degrees and Muscle Strength 4/5 Right Upper Extremity with Lymphedema Left: Full ROM and Muscle Strength 5/5 Thoracic Paraspinal Tenderness: T-1- T-6 Lumbar Paraspinal Tenderness: L-3- L-5 Lower Extremities: Full ROM and Muscle Strength 5/5 Arises from chair with ease Narrow Based gait  Neurological: She is alert and oriented to [place], place, and time.  Skin: Skin is warm and dry.  Psychiatric: She has a normal mood and affect.  Nursing note and vitals reviewed.         Assessment & Plan:  1.Myofascial pain syndrome chronic postoperative, as well as post radiation: S/P Breast Reconstruction Surgery x 2.  Refilled: Oxycodone 10 mg one tablet every 8 hours as needed for severe pain.# 90. Continue with Exercise Regime. 2. Obesity: Losing weight continue with Healthy Diet and Exercise Regime.   20 minutes of face to face patient care time was spent during this visit. All questions were encouraged and answered

## 2015-03-06 ENCOUNTER — Ambulatory Visit: Payer: Medicare Other | Admitting: Physical Therapy

## 2015-03-06 DIAGNOSIS — M25611 Stiffness of right shoulder, not elsewhere classified: Secondary | ICD-10-CM

## 2015-03-06 DIAGNOSIS — R293 Abnormal posture: Secondary | ICD-10-CM

## 2015-03-06 DIAGNOSIS — M6281 Muscle weakness (generalized): Secondary | ICD-10-CM

## 2015-03-06 DIAGNOSIS — I890 Lymphedema, not elsewhere classified: Secondary | ICD-10-CM

## 2015-03-06 DIAGNOSIS — M62838 Other muscle spasm: Secondary | ICD-10-CM

## 2015-03-06 DIAGNOSIS — M797 Fibromyalgia: Secondary | ICD-10-CM | POA: Diagnosis not present

## 2015-03-06 DIAGNOSIS — M542 Cervicalgia: Secondary | ICD-10-CM

## 2015-03-06 DIAGNOSIS — E8989 Other postprocedural endocrine and metabolic complications and disorders: Secondary | ICD-10-CM

## 2015-03-06 DIAGNOSIS — M7918 Myalgia, other site: Secondary | ICD-10-CM

## 2015-03-06 DIAGNOSIS — M5489 Other dorsalgia: Secondary | ICD-10-CM

## 2015-03-06 NOTE — Therapy (Signed)
[place], [place], [address] Phone: [phone]   Fax:  [phone]  Physical Therapy Treatment  Patient Details  Name: Karen Hopkins MRN: [id number] Date of Birth: [birth year] Referring Provider:  [name], MD  Encounter Date: 03/06/2015      PT End of Session - 03/06/15 1138    Visit Number 8   Number of Visits 13   Date for PT Re-Evaluation 04/03/15   Authorization Type Medicare KX   Authorization Time Period 01-08-15 to 04-03-15   PT Start Time 1015   PT Stop Time 1120   PT Time Calculation (min) 65 min   Activity Tolerance Patient tolerated treatment well   Behavior During Therapy [facility] for tasks assessed/performed      Past Medical History  Diagnosis Date  . Seasonal allergies   . Lymphedema of arm     right; no BP or puncture to right arm  . Depression   . Anxiety   . Sinus headache   . Hypertension     under control with med., has been on med. x 2 yr.  . History of breast cancer 2011    right  . History of chemotherapy 2011  . History of radiation therapy 2011  . Cancer     Past Surgical History  Procedure Laterality Date  . Supra-umbilical hernia  9675  . Nasal septum surgery    . Latissimus flap to breast Right 03/12/2013    Procedure: RIGHT BREAST LATISSIMUS FLAP WITH EXPANDER PLACEMENT;  Surgeon: Theodoro Kos, DO;  Location: [place];  Service: Plastics;  Laterality: Right;  . Cesarean section  07/15/2001; 03/18/2004; 11/21/2008  . Tubal ligation  11/21/2008  . Portacath placement Left 09/12/2009  . Modified radical mastectomy Right 03/11/2010  . Port-a-cath removal Left 12/01/2010  . Removal of tissue expander and placement of implant Right 08/23/2013    Procedure: REMOVAL RIGHT TISSUE EXPANDER AND PLACEMENT OF IMPLANT TO RIGHT BREAST ;  Surgeon: Theodoro Kos, DO;  Location: [place];  Service: Plastics;  Laterality: Right;  . Breast reduction surgery Left  08/23/2013    Procedure: LEFT BREAST REDUCTION  ;  Surgeon: Theodoro Kos, DO;  Location: [place];  Service: Plastics;  Laterality: Left;  . Liposuction Bilateral 08/23/2013    Procedure: LIPOSUCTION;  Surgeon: Theodoro Kos, DO;  Location: [place];  Service: Plastics;  Laterality: Bilateral;    There were no vitals filed for this visit.  Visit Diagnosis:  Myofascial muscle pain  Abnormal posture  Lymphedema of upper extremity following lymphadenectomy  Stiffness of joint, shoulder region, right  Muscle spasms of neck  Neck pain on right side  Right-sided back pain, unspecified location  Shoulder joint stiffness, right  Muscle weakness of right upper extremity      Subjective Assessment - 03/06/15 1016    Subjective I am overwhelmed with kids. My son has high HBP and I am running late to take my son to school in [place]. I havent ben here in so long.  And I am so far.  I think I would rather just have deep tissue today for my papin   Pertinent History Right breast cancer with mastectomy 2011; reconstruction; left breast reduction; h/o right UE lymphedema and right upper quadrant pain, treated in this clinic several times before.  Recent scare with left breeast, but but ultrasound and mammogram were negative.   Currently in Pain? Yes   Pain Score  8    Pain Score 6   Pain Location Neck   Pain Orientation Right   Pain Descriptors / Indicators Aching   Pain Type Chronic pain   Pain Frequency Constant   Pain Score 3   Pain Location --  sternal pectoral pain   Pain Orientation Right            [facility] PT Assessment - 03/06/15 1131    Observation/Other Assessments   Focus on Therapeutic Outcomes (FOTO)  intake 34%  limtation 66% predicted 41%  Eval 76%   AROM   Right Shoulder Extension 30 Degrees  passive 42 degrees   Right Shoulder Flexion 96 Degrees  Pt with decreased ROM due to pain with shoulder motion   Right Shoulder  ABduction 96 Degrees  Pain limitiing   Right Shoulder Internal Rotation 47 Degrees  ERP   Right Shoulder External Rotation 63 Degrees  ERP                     [facility] Adult PT Treatment/Exercise - 03/06/15 1024    Lumbar Exercises: Supine   Other Supine Lumbar Exercises use of towel and tennis ball for added myofascial stretch in sitting position   Shoulder Exercises: Prone   Other Prone Exercises self MFR with tennis ball to pecs   Modalities   Modalities Electrical Stimulation   Moist Heat Therapy   Number Minutes Moist Heat 15 Minutes   Moist Heat Location Cervical   Electrical Stimulation   Electrical Stimulation Location upper trap and cervical   Electrical Stimulation Action IFC   Electrical Stimulation Parameters pt tol for 15 minj   Electrical Stimulation Goals Pain   Manual Therapy   Manual Therapy Soft tissue mobilization;Scapular mobilization;Myofascial release   Joint Mobilization 1st rib mobs grade 3/4    Soft tissue mobilization upper trap , levator and rhomboid bil   Myofascial Release Right pectoralis, Right teres major lateral scapular muscle using MFR and IASTYM tool   Scapular Mobilization scapular med/lat/sup/inf glides and distraction grade 3/4   Other Manual Therapy Pt reviewed in use of tennis ball for self myofascial release techniique over pectoral, teres major, upper trap  bil.          Trigger Point Dry Needling - 03/06/15 1033    Consent Given? Yes   Muscles Treated Upper Body Upper trapezius;Levator scapulae;Rhomboids;Subscapularis              PT Education - 03/06/15 1137    Education provided Yes   Education Details Pt educated on self care for need for deep tissue work in communitiy setting and self care techniques.  Reviewed sefl myofascial release techniques using towel and tennis ball   Schellhase(s) Educated Patient   Methods Explanation;Demonstration;Handout;Verbal cues   Comprehension Verbalized understanding;Returned  demonstration          PT Short Term Goals - 02/20/15 1407    PT SHORT TERM GOAL #1   Title Patient will report a 25% reduction in pain with usual daily activities and home chores.   Baseline fluctuates from 4/10 to 8/10   Time 6   Period Weeks   Status Partially Met   PT SHORT TERM GOAL #2   Title Patient will be able to resume a basic home ex program with minimal increase in pain    Baseline pain tends to fluctuate due to R upper trap compensatory spasms   Time 6   Period Weeks   Status Partially Met  PT SHORT TERM GOAL #3   Title Right shoulder flexion improved to  115 degrees needed for reaching shoulder height shelves.   Baseline 112 AROM   120 passive   Time 6   Period Weeks   Status Partially Met   PT SHORT TERM GOAL #4   Title Berg improved  10 points form baseline   Baseline 49/56   Time 6   Period Weeks   Status On-going         Short Term Clinic Goals - 09/03/14 1217    CC Short Term Goal  #1   Title short term goals= long term goals          PT Long Term Goals - 03/06/15 1143    PT LONG TERM GOAL #1   Title Independent with progressive HEP for further ROM, strengthening of Right UE.   Baseline ongoing goal, pt with increased body swelling and seeing MD   Time 12   Period Weeks   Status On-going   PT LONG TERM GOAL #2   Title Right shoulder flexion and abduction AROM improved to 125 degrees needed for reaching eye level shelves in the kitchen.     Baseline 112 max AROM 2 weeks ago   Time 12   Period Weeks   PT LONG TERM GOAL #3   Title Right UE strength grossly 4-/5 needed for basic cooking/cleaning for her 3 children.     Time 12   Period Weeks   Status Achieved   PT LONG TERM GOAL #4   Title FOTO improved 20 ppoints for indication of overall improved function   Baseline 66% limiation today   Time 12   Period Weeks   Status On-going   PT LONG TERM GOAL #5   Title Patient will report overall improvement in pain level 3/10 level or better   and improve  function at 50%.   Baseline Pain is 8/10 today sporadic but TDN helpful   Time 12   Period Weeks   Status On-going   PT LONG TERM GOAL #6   Time 12   Period Weeks   Status On-going   PT LONG TERM GOAL #7   Title Pt will incorporate walking program in to daily life to encourage aerobic exercise for health  Pt to work up to 1/4 a mile   Time 12   Period Weeks   Status On-going           [place] - 10/23/14 1149    CC Long Term Goal  #3   Title Right arm circumference at 15 cm. proximal to olecranon will be reduced at least 0.5 cm.   Status Achieved   CC Long Term Goal  #4   Title right arm circumference at 15 cm provximal to olecranon will be reduced by 3 sm   Status Partially Met            Plan - 03/06/15 1139    Clinical Impression Statement Pt with decreased AROM of Right shoulder today and decreased tissue extensibillity.  Pt states she is under stress and not taking time for herself to exercise and do self care needed to maintain AROM of shoulder.  Pt with decreased AROM today limited by pain and spasm.  Pt recieved deep tissue and TDN with estim to recieve relief.  Pt realizes she needs to schedule her exercise daily.  and knows that trigger point dry needling is effective but she will need to  maintain her AROM with exercise and techniques at home. Pt was able to puch up from the mat using right arm and FOTO limitation is improved from 76% limitation to 66% today.   Pt will benefit from skilled therapeutic intervention in order to improve on the following deficits Impaired UE functional use;Increased fascial restricitons;Increased edema;Impaired flexibility;Postural dysfunction;Obesity;Decreased scar mobility;Decreased range of motion   Rehab Potential Good   Clinical Impairments Affecting Rehab Potential multiple medical and chronic pain issues   PT Frequency 1x / week   PT Duration 6 weeks   PT Treatment/Interventions ADLs/Self Care Home  Management;Patient/family education;Passive range of motion;Dry needling;Taping;Therapeutic exercise;Moist Heat;Cryotherapy        Problem List Patient Active Problem List   Diagnosis Date Noted  . Type 2 diabetes mellitus without complication 91/11/8164  . Lower extremity edema 10/01/2014  . Breast cancer, right breast 05/24/2014  . Myalgia and myositis 05/17/2014  . Neoplasm related pain 03/12/2014  . CN (constipation) 11/26/2013  . H/O reduction mammoplasty 10/30/2013  . Seizure 10/06/2013  . Status post bilateral breast implants 08/31/2013  . Acquired absence of breast and absent nipple 08/23/2013  . S/P breast reconstruction, right 08/23/2013  . AC joint arthropathy 07/02/2013  . Routine adult health maintenance 05/29/2013  . Hypokalemia 05/21/2013  . Chronic pain 04/19/2013  . Elevated blood sugar 03/09/2013  . Wheezing 02/22/2013  . Acute bronchitis 02/22/2013  . Lymphedema 05/24/2011  . RHINOSINUSITIS, CHRONIC 04/08/2010  . Obesity 08/04/2006  . DEPRESSIVE DISORDER, NOS 08/04/2006  . RHINITIS, ALLERGIC 08/04/2006   Voncille Lo, PT 03/06/2015 11:45 AM Phone: [phone] Fax: [facility] [place], [place], [address] Phone: [phone]   Fax:  [phone]

## 2015-03-06 NOTE — Patient Instructions (Signed)
Continue to do the self myofascial stretch for upper traps and pectoral stretch.

## 2015-03-07 ENCOUNTER — Ambulatory Visit: Payer: Medicare Other | Admitting: Podiatry

## 2015-03-07 ENCOUNTER — Other Ambulatory Visit: Payer: Self-pay | Admitting: Oncology

## 2015-03-11 ENCOUNTER — Ambulatory Visit (HOSPITAL_BASED_OUTPATIENT_CLINIC_OR_DEPARTMENT_OTHER): Payer: Medicare Other

## 2015-03-11 DIAGNOSIS — Z5111 Encounter for antineoplastic chemotherapy: Secondary | ICD-10-CM

## 2015-03-11 DIAGNOSIS — C50911 Malignant neoplasm of unspecified site of right female breast: Secondary | ICD-10-CM | POA: Diagnosis not present

## 2015-03-11 DIAGNOSIS — C50921 Malignant neoplasm of unspecified site of right male breast: Secondary | ICD-10-CM

## 2015-03-11 MED ORDER — GOSERELIN ACETATE 3.6 MG ~~LOC~~ IMPL
3.6000 mg | DRUG_IMPLANT | Freq: Once | SUBCUTANEOUS | Status: AC
  Administered 2015-03-11: 3.6 mg via SUBCUTANEOUS
  Filled 2015-03-11: qty 3.6

## 2015-03-11 NOTE — Patient Instructions (Signed)
Goserelin injection What is this medicine? GOSERELIN (GOE se rel in) is similar to a hormone found in the body. It lowers the amount of sex hormones that the body makes. Men will have lower testosterone levels and women will have lower estrogen levels while taking this medicine. In men, this medicine is used to treat prostate cancer; the injection is either given once per month or once every 12 weeks. A once per month injection (only) is used to treat women with endometriosis, dysfunctional uterine bleeding, or advanced breast cancer. This medicine may be used for other purposes; ask your health care provider or pharmacist if you have questions. COMMON BRAND NAME(S): Zoladex What should I tell my health care provider before I take this medicine? They need to know if you have any of these conditions (some only apply to women): -diabetes -heart disease or previous heart attack -high blood pressure -high cholesterol -kidney disease -osteoporosis or low bone density -problems passing urine -spinal cord injury -stroke -tobacco smoker -an unusual or allergic reaction to goserelin, hormone therapy, other medicines, foods, dyes, or preservatives -pregnant or trying to get pregnant -breast-feeding How should I use this medicine? This medicine is for injection under the skin. It is given by a health care professional in a hospital or clinic setting. Men receive this injection once every 4 weeks or once every 12 weeks. Women will only receive the once every 4 weeks injection. Talk to your pediatrician regarding the use of this medicine in children. Special care may be needed. Overdosage: If you think you have taken too much of this medicine contact a poison control center or emergency room at once. NOTE: This medicine is only for you. Do not share this medicine with others. What if I miss a dose? It is important not to miss your dose. Call your doctor or health care professional if you are unable to  keep an appointment. What may interact with this medicine? -female hormones like estrogen -herbal or dietary supplements like black cohosh, chasteberry, or DHEA -female hormones like testosterone -prasterone This list may not describe all possible interactions. Give your health care provider a list of all the medicines, herbs, non-prescription drugs, or dietary supplements you use. Also tell them if you smoke, drink alcohol, or use illegal drugs. Some items may interact with your medicine. What should I watch for while using this medicine? Visit your doctor or health care professional for regular checks on your progress. Your symptoms may appear to get worse during the first weeks of this therapy. Tell your doctor or healthcare professional if your symptoms do not start to get better or if they get worse after this time. Your bones may get weaker if you take this medicine for a long time. If you smoke or frequently drink alcohol you may increase your risk of bone loss. A family history of osteoporosis, chronic use of drugs for seizures (convulsions), or corticosteroids can also increase your risk of bone loss. Talk to your doctor about how to keep your bones strong. This medicine should stop regular monthly menstration in women. Tell your doctor if you continue to menstrate. Women should not become pregnant while taking this medicine or for 12 weeks after stopping this medicine. Women should inform their doctor if they wish to become pregnant or think they might be pregnant. There is a potential for serious side effects to an unborn child. Talk to your health care professional or pharmacist for more information. Do not breast-feed an infant while taking   this medicine. Men should inform their doctors if they wish to father a child. This medicine may lower sperm counts. Talk to your health care professional or pharmacist for more information. What side effects may I notice from receiving this  medicine? Side effects that you should report to your doctor or health care professional as soon as possible: -allergic reactions like skin rash, itching or hives, swelling of the face, lips, or tongue -bone pain -breathing problems -changes in vision -chest pain -feeling faint or lightheaded, falls -fever, chills -pain, swelling, warmth in the leg -pain, tingling, numbness in the hands or feet -signs and symptoms of low blood pressure like dizziness; feeling faint or lightheaded, falls; unusually weak or tired -stomach pain -swelling of the ankles, feet, hands -trouble passing urine or change in the amount of urine -unusually high or low blood pressure -unusually weak or tired Side effects that usually do not require medical attention (report to your doctor or health care professional if they continue or are bothersome): -change in sex drive or performance -changes in breast size in both males and females -changes in emotions or moods -headache -hot flashes -irritation at site where injected -loss of appetite -skin problems like acne, dry skin -vaginal dryness This list may not describe all possible side effects. Call your doctor for medical advice about side effects. You may report side effects to FDA at 1-800-FDA-1088. Where should I keep my medicine? This drug is given in a hospital or clinic and will not be stored at home. NOTE: This sheet is a summary. It may not cover all possible information. If you have questions about this medicine, talk to your doctor, pharmacist, or health care provider.  2015, [employer]. (2013-07-31 11:10:35)  

## 2015-03-12 ENCOUNTER — Other Ambulatory Visit: Payer: Self-pay

## 2015-03-12 DIAGNOSIS — E876 Hypokalemia: Secondary | ICD-10-CM

## 2015-03-12 DIAGNOSIS — C50911 Malignant neoplasm of unspecified site of right female breast: Secondary | ICD-10-CM

## 2015-03-12 MED ORDER — POTASSIUM CHLORIDE ER 10 MEQ PO CPCR: 10.0000 meq | ORAL_CAPSULE | Freq: Two times a day (BID) | ORAL | Status: DC

## 2015-03-12 MED ORDER — TRIAMTERENE-HCTZ 37.5-25 MG PO TABS: ORAL_TABLET | ORAL | Status: DC

## 2015-03-13 ENCOUNTER — Ambulatory Visit: Payer: Medicare Other | Attending: Physical Medicine & Rehabilitation | Admitting: Physical Therapy

## 2015-03-13 DIAGNOSIS — M62838 Other muscle spasm: Secondary | ICD-10-CM

## 2015-03-13 DIAGNOSIS — R293 Abnormal posture: Secondary | ICD-10-CM

## 2015-03-13 DIAGNOSIS — M797 Fibromyalgia: Secondary | ICD-10-CM | POA: Diagnosis not present

## 2015-03-13 DIAGNOSIS — M25611 Stiffness of right shoulder, not elsewhere classified: Secondary | ICD-10-CM | POA: Diagnosis present

## 2015-03-13 DIAGNOSIS — M5489 Other dorsalgia: Secondary | ICD-10-CM

## 2015-03-13 DIAGNOSIS — M542 Cervicalgia: Secondary | ICD-10-CM | POA: Diagnosis present

## 2015-03-13 DIAGNOSIS — I890 Lymphedema, not elsewhere classified: Secondary | ICD-10-CM | POA: Diagnosis present

## 2015-03-13 DIAGNOSIS — E8989 Other postprocedural endocrine and metabolic complications and disorders: Secondary | ICD-10-CM

## 2015-03-13 DIAGNOSIS — M7918 Myalgia, other site: Secondary | ICD-10-CM

## 2015-03-13 DIAGNOSIS — M6248 Contracture of muscle, other site: Secondary | ICD-10-CM | POA: Diagnosis present

## 2015-03-13 DIAGNOSIS — M6281 Muscle weakness (generalized): Secondary | ICD-10-CM | POA: Diagnosis present

## 2015-03-13 NOTE — Therapy (Signed)
[place], [place], [address] Phone: [phone]   Fax:  [phone]  Physical Therapy Treatment  Patient Details  Name: Karen Hopkins MRN: [id number] Date of Birth: [birth year] Referring Provider:  [name], MD  Encounter Date: 03/13/2015      PT End of Session - 03/13/15 1105    Visit Number 9   Number of Visits 13   Date for PT Re-Evaluation 04/03/15   Authorization Type Medicare KX   Authorization Time Period 01-08-15 to 04-03-15   PT Start Time 1015   PT Stop Time 1115   PT Time Calculation (min) 60 min   Activity Tolerance Patient tolerated treatment well   Behavior During Therapy [facility] for tasks assessed/performed      Past Medical History  Diagnosis Date  . Seasonal allergies   . Lymphedema of arm     right; no BP or puncture to right arm  . Depression   . Anxiety   . Sinus headache   . Hypertension     under control with med., has been on med. x 2 yr.  . History of breast cancer 2011    right  . History of chemotherapy 2011  . History of radiation therapy 2011  . Cancer     Past Surgical History  Procedure Laterality Date  . Supra-umbilical hernia  9201  . Nasal septum surgery    . Latissimus flap to breast Right 03/12/2013    Procedure: RIGHT BREAST LATISSIMUS FLAP WITH EXPANDER PLACEMENT;  Surgeon: Theodoro Kos, DO;  Location: [place];  Service: Plastics;  Laterality: Right;  . Cesarean section  07/15/2001; 03/18/2004; 11/21/2008  . Tubal ligation  11/21/2008  . Portacath placement Left 09/12/2009  . Modified radical mastectomy Right 03/11/2010  . Port-a-cath removal Left 12/01/2010  . Removal of tissue expander and placement of implant Right 08/23/2013    Procedure: REMOVAL RIGHT TISSUE EXPANDER AND PLACEMENT OF IMPLANT TO RIGHT BREAST ;  Surgeon: Theodoro Kos, DO;  Location: [place];  Service: Plastics;  Laterality: Right;  . Breast reduction surgery Left  08/23/2013    Procedure: LEFT BREAST REDUCTION  ;  Surgeon: Theodoro Kos, DO;  Location: [place];  Service: Plastics;  Laterality: Left;  . Liposuction Bilateral 08/23/2013    Procedure: LIPOSUCTION;  Surgeon: Theodoro Kos, DO;  Location: [place];  Service: Plastics;  Laterality: Bilateral;    There were no vitals filed for this visit.  Visit Diagnosis:  Myofascial muscle pain  Abnormal posture  Stiffness of joint, shoulder region, right  Lymphedema of upper extremity following lymphadenectomy  Muscle spasms of neck  Neck pain on right side  Shoulder joint stiffness, right  Muscle weakness of right upper extremity  Right-sided back pain, unspecified location      Subjective Assessment - 03/13/15 1019    Subjective Still having pain in R shoulder and arm.  Still having stiffness.  Trying to do exercises.   Pertinent History Right breast cancer with mastectomy 2011; reconstruction; left breast reduction; h/o right UE lymphedema and right upper quadrant pain, treated in this clinic several times before.  Recent scare with left breeast, but but ultrasound and mammogram were negative.   How long can you stand comfortably? 30 min   How long can you walk comfortably? 30 min   Diagnostic tests mammogram, U/S;  another follow up with left breast due now   Patient Stated Goals decrease pain and  use arm regularly (right hand dominant) and be able to handle my pain at home    Pain Score 6    Pain Location Shoulder   Pain Orientation Right   Pain Descriptors / Indicators Aching   Pain Type Chronic pain   Pain Onset More than a month ago   Pain Frequency Intermittent   Aggravating Factors  moving arm at shoulder level   Pain Relieving Factors dry needling, manual therapy                         [facility] Adult PT Treatment/Exercise - 03/13/15 1020    Neck Exercises: Machines for Strengthening   UBE (Upper Arm Bike) Level 1.0 x 6 min    Shoulder Exercises: Standing   Protraction Right;15 reps;Theraband   Theraband Level (Shoulder Protraction) Level 1 (Yellow)   External Rotation Strengthening;Right;15 reps;Theraband   Theraband Level (Shoulder External Rotation) Level 1 (Yellow)   Internal Rotation Right;15 reps;Theraband   Theraband Level (Shoulder Internal Rotation) Level 1 (Yellow)   Retraction Right;15 reps;Theraband   Theraband Level (Shoulder Retraction) Level 1 (Yellow)   Other Standing Exercises UE ranger; level 12, flexion x15 reps; horizontal abdct/addct x 15   Modalities   Modalities Electrical Stimulation   Electrical Stimulation   Electrical Stimulation Location upper trap and cervical   Electrical Stimulation Action IFC   Electrical Stimulation Parameters to tolerance x 10 min   Electrical Stimulation Goals Pain   Manual Therapy   Manual Therapy Soft tissue mobilization;Myofascial release   Joint Mobilization 1st rib mobs grade 3/4    Soft tissue mobilization upper trap , levator and rhomboid Right                  PT Short Term Goals - 02/20/15 1407    PT SHORT TERM GOAL #1   Title Patient will report a 25% reduction in pain with usual daily activities and home chores.   Baseline fluctuates from 4/10 to 8/10   Time 6   Period Weeks   Status Partially Met   PT SHORT TERM GOAL #2   Title Patient will be able to resume a basic home ex program with minimal increase in pain    Baseline pain tends to fluctuate due to R upper trap compensatory spasms   Time 6   Period Weeks   Status Partially Met   PT SHORT TERM GOAL #3   Title Right shoulder flexion improved to  115 degrees needed for reaching shoulder height shelves.   Baseline 112 AROM   120 passive   Time 6   Period Weeks   Status Partially Met   PT SHORT TERM GOAL #4   Title Berg improved  10 points form baseline   Baseline 49/56   Time 6   Period Weeks   Status On-going         Short Term Clinic Goals - 09/03/14 1217     CC Short Term Goal  #1   Title short term goals= long term goals          PT Long Term Goals - 03/06/15 1143    PT LONG TERM GOAL #1   Title Independent with progressive HEP for further ROM, strengthening of Right UE.   Baseline ongoing goal, pt with increased body swelling and seeing MD   Time 12   Period Weeks   Status On-going   PT LONG TERM GOAL #2   Title Right shoulder  flexion and abduction AROM improved to 125 degrees needed for reaching eye level shelves in the kitchen.     Baseline 112 max AROM 2 weeks ago   Time 12   Period Weeks   PT LONG TERM GOAL #3   Title Right UE strength grossly 4-/5 needed for basic cooking/cleaning for her 3 children.     Time 12   Period Weeks   Status Achieved   PT LONG TERM GOAL #4   Title FOTO improved 20 ppoints for indication of overall improved function   Baseline 66% limiation today   Time 12   Period Weeks   Status On-going   PT LONG TERM GOAL #5   Title Patient will report overall improvement in pain level 3/10 level or better  and improve  function at 50%.   Baseline Pain is 8/10 today sporadic but TDN helpful   Time 12   Period Weeks   Status On-going   PT LONG TERM GOAL #6   Time 12   Period Weeks   Status On-going   PT LONG TERM GOAL #7   Title Pt will incorporate walking program in to daily life to encourage aerobic exercise for health  Pt to work up to 1/4 a mile   Time 12   Period Weeks   Status On-going           [place] - 10/23/14 1149    CC Long Term Goal  #3   Title Right arm circumference at 15 cm. proximal to olecranon will be reduced at least 0.5 cm.   Status Achieved   CC Long Term Goal  #4   Title right arm circumference at 15 cm provximal to olecranon will be reduced by 3 sm   Status Partially Met            Plan - 03/13/15 1105    Clinical Impression Statement Pt continues to demonstrate muscle tightness bil upper traps and R shoulder.  Pt reports trying to increase  compliance with HEP at this time.  Tolerated increased activity today without increase in pain.   PT Next Visit Plan FOTO, gcode, continue exercises as tolerated, manual and dry needling PRN   Consulted and Agree with Plan of Care Patient        Problem List Patient Active Problem List   Diagnosis Date Noted  . Type 2 diabetes mellitus without complication ([place]) 26/71/2458  . Lower extremity edema 10/01/2014  . Breast cancer, right breast ([place]) 05/24/2014  . Myalgia and myositis 05/17/2014  . Neoplasm related pain 03/12/2014  . CN (constipation) 11/26/2013  . H/O reduction mammoplasty 10/30/2013  . Seizure ([place]) 10/06/2013  . Status post bilateral breast implants 08/31/2013  . Acquired absence of breast and absent nipple 08/23/2013  . S/P breast reconstruction, right 08/23/2013  . AC joint arthropathy 07/02/2013  . Routine adult health maintenance 05/29/2013  . Hypokalemia 05/21/2013  . Chronic pain 04/19/2013  . Elevated blood sugar 03/09/2013  . Wheezing 02/22/2013  . Acute bronchitis 02/22/2013  . Lymphedema 05/24/2011  . RHINOSINUSITIS, CHRONIC 04/08/2010  . Obesity 08/04/2006  . DEPRESSIVE DISORDER, NOS 08/04/2006  . RHINITIS, ALLERGIC 08/04/2006   Laureen Abrahams, PT, DPT 03/13/2015 11:17 AM  [facility] [address] [place], [place], [address] Phone: [phone]   Fax:  [phone]

## 2015-03-14 ENCOUNTER — Telehealth: Payer: Self-pay | Admitting: Family Medicine

## 2015-03-14 NOTE — Telephone Encounter (Signed)
Need refill on her furosemide.  Call in to [facility]

## 2015-03-17 MED ORDER — FUROSEMIDE 20 MG PO TABS: 20.0000 mg | ORAL_TABLET | Freq: Every day | ORAL | Status: DC

## 2015-03-17 NOTE — Telephone Encounter (Signed)
Rx sent in. Thanks! Lawan Nanez J Jefrey Raburn, MD  

## 2015-03-18 ENCOUNTER — Ambulatory Visit: Payer: Medicare Other | Admitting: Physical Therapy

## 2015-03-19 ENCOUNTER — Other Ambulatory Visit: Payer: Medicare Other

## 2015-03-24 ENCOUNTER — Other Ambulatory Visit: Payer: Self-pay | Admitting: *Deleted

## 2015-03-25 ENCOUNTER — Telehealth: Payer: Self-pay | Admitting: Nurse Practitioner

## 2015-03-25 NOTE — Telephone Encounter (Signed)
Called patient and she is aware of her appointment °

## 2015-03-26 ENCOUNTER — Ambulatory Visit: Payer: Medicare Other | Admitting: Nurse Practitioner

## 2015-03-26 ENCOUNTER — Other Ambulatory Visit: Payer: Medicare Other

## 2015-03-27 ENCOUNTER — Ambulatory Visit: Payer: Medicare Other | Admitting: Physical Therapy

## 2015-03-27 DIAGNOSIS — R293 Abnormal posture: Secondary | ICD-10-CM

## 2015-03-27 DIAGNOSIS — M6281 Muscle weakness (generalized): Secondary | ICD-10-CM

## 2015-03-27 DIAGNOSIS — E8989 Other postprocedural endocrine and metabolic complications and disorders: Secondary | ICD-10-CM

## 2015-03-27 DIAGNOSIS — I890 Lymphedema, not elsewhere classified: Secondary | ICD-10-CM

## 2015-03-27 DIAGNOSIS — M62838 Other muscle spasm: Secondary | ICD-10-CM

## 2015-03-27 DIAGNOSIS — M542 Cervicalgia: Secondary | ICD-10-CM

## 2015-03-27 DIAGNOSIS — M797 Fibromyalgia: Secondary | ICD-10-CM | POA: Diagnosis not present

## 2015-03-27 DIAGNOSIS — M25611 Stiffness of right shoulder, not elsewhere classified: Secondary | ICD-10-CM

## 2015-03-27 DIAGNOSIS — M7918 Myalgia, other site: Secondary | ICD-10-CM

## 2015-03-27 DIAGNOSIS — M5489 Other dorsalgia: Secondary | ICD-10-CM

## 2015-03-27 NOTE — Patient Instructions (Signed)
Pt given exercise sheet for  Upper crossed posture syndrome and reviewed exercises Voncille Lo, PT 03/27/2015 1:33 PM Phone: [phone] Fax: [phone]

## 2015-03-27 NOTE — Therapy (Signed)
[place], [place], [address] Phone: [phone]   Fax:  [phone]  Physical Therapy Treatment  Patient Details  Name: Karen Hopkins MRN: [id number] Date of Birth: 02/28/1976 Referring Provider: [name], MD  Encounter Date: 03/27/2015      PT End of Session - 03/27/15 1334    Visit Number 10   Number of Visits 13   Date for PT Re-Evaluation 04/03/15   Authorization Type Medicare KX   Authorization Time Period 01-08-15 to 04-03-15   PT Start Time 1017   PT Stop Time 1100   PT Time Calculation (min) 43 min   Activity Tolerance Patient tolerated treatment well   Behavior During Therapy [facility] for tasks assessed/performed      Past Medical History  Diagnosis Date  . Seasonal allergies   . Lymphedema of arm     right; no BP or puncture to right arm  . Depression   . Anxiety   . Sinus headache   . Hypertension     under control with med., has been on med. x 2 yr.  . History of breast cancer 2011    right  . History of chemotherapy 2011  . History of radiation therapy 2011  . Cancer     Past Surgical History  Procedure Laterality Date  . Supra-umbilical hernia  4166  . Nasal septum surgery    . Latissimus flap to breast Right 03/12/2013    Procedure: RIGHT BREAST LATISSIMUS FLAP WITH EXPANDER PLACEMENT;  Surgeon: Theodoro Kos, DO;  Location: [place];  Service: Plastics;  Laterality: Right;  . Cesarean section  07/15/2001; 03/18/2004; 11/21/2008  . Tubal ligation  11/21/2008  . Portacath placement Left 09/12/2009  . Modified radical mastectomy Right 03/11/2010  . Port-a-cath removal Left 12/01/2010  . Removal of tissue expander and placement of implant Right 08/23/2013    Procedure: REMOVAL RIGHT TISSUE EXPANDER AND PLACEMENT OF IMPLANT TO RIGHT BREAST ;  Surgeon: Theodoro Kos, DO;  Location: [place];  Service: Plastics;  Laterality: Right;  . Breast reduction surgery Left  08/23/2013    Procedure: LEFT BREAST REDUCTION  ;  Surgeon: Theodoro Kos, DO;  Location: [place];  Service: Plastics;  Laterality: Left;  . Liposuction Bilateral 08/23/2013    Procedure: LIPOSUCTION;  Surgeon: Theodoro Kos, DO;  Location: [place];  Service: Plastics;  Laterality: Bilateral;    There were no vitals filed for this visit.  Visit Diagnosis:  Myofascial muscle pain  Abnormal posture  Stiffness of joint, shoulder region, right  Lymphedema of upper extremity following lymphadenectomy  Muscle spasms of neck  Neck pain on right side  Shoulder joint stiffness, right  Muscle weakness of right upper extremity  Right-sided back pain, unspecified location      Subjective Assessment - 03/27/15 1029    Subjective I am stressed.  I am able to touch the top of my opposite shoulder and then I can try to do the under arm.  A cat scratched me on left shoulder   Pertinent History Right breast cancer with mastectomy 2011; reconstruction; left breast reduction; h/o right UE lymphedema and right upper quadrant pain, treated in this clinic several times before.  Recent scare with left breeast, but but ultrasound and mammogram were negative.   Currently in Pain? Yes   Pain Score 7   my whole body feels weired stressed   Pain Location Shoulder   Pain Orientation  Right   Pain Descriptors / Indicators Aching   Pain Type Chronic pain   Pain Score 7   Pain Location Neck   Pain Orientation Right            [facility] PT Assessment - 03/27/15 1034    Assessment   Referring Provider [name], MD   Observation/Other Assessments   Observations pt wth left swelling over left trap due to cat scratch.    Focus on Therapeutic Outcomes (FOTO)  limitation 59%  Eval 76%   AROM   Right Shoulder Extension 30 Degrees  passive 42 degrees   Right Shoulder Flexion 110 Degrees  Pt with decreased ROM due to pain with shoulder motion   Right Shoulder  ABduction 108 Degrees  Pain limitiing   Right Shoulder Internal Rotation 47 Degrees  ERP   Right Shoulder External Rotation 70 Degrees  ERP   Left Shoulder Horizontal ABduction 50 Degrees   Left Shoulder Horizontal ADduction 50 Degrees   Cervical Flexion 55   Cervical Extension 60   Cervical - Right Side Bend 46   Cervical - Left Side Bend 50   Cervical - Right Rotation 70   Cervical - Left Rotation 70                     [facility] Adult PT Treatment/Exercise - 03/27/15 1030    Manual Therapy   Manual Therapy Soft tissue mobilization;Myofascial release   Joint Mobilization 1st rib mobs grade 3/4    Soft tissue mobilization upper trap , levator and rhomboid Right   Scapular Mobilization scapular med/lat/sup/inf glides and distraction grade 3/4   Kinesiotix   Inhibit Muscle  upper trap left basketweave edema KT tape   Neck Exercises: Stretches   Neck Stretch 2 reps;30 seconds   Corner Stretch 2 reps;30 seconds   Chest Stretch 2 reps;30 seconds   Other Neck Stretches neck retraction x 5  5sec stretch.          Trigger Point Dry Needling - 03/27/15 1031    Consent Given? Yes   Muscles Treated Upper Body Upper trapezius;Levator scapulae;Rhomboids;Subscapularis  Right side only              PT Education - 03/27/15 1021    Education provided Yes   Education Details Pt given handout on upper crossed syndrome and reviewed exercise   [place](s) Educated Patient   Methods Explanation;Demonstration;Verbal cues   Comprehension Verbalized understanding;Returned demonstration          PT Short Term Goals - 02/20/15 1407    PT SHORT TERM GOAL #1   Title Patient will report a 25% reduction in pain with usual daily activities and home chores.   Baseline fluctuates from 4/10 to 8/10   Time 6   Period Weeks   Status Partially Met   PT SHORT TERM GOAL #2   Title Patient will be able to resume a basic home ex program with minimal increase in pain    Baseline pain  tends to fluctuate due to R upper trap compensatory spasms   Time 6   Period Weeks   Status Partially Met   PT SHORT TERM GOAL #3   Title Right shoulder flexion improved to  115 degrees needed for reaching shoulder height shelves.   Baseline 112 AROM   120 passive   Time 6   Period Weeks   Status Partially Met   PT SHORT TERM GOAL #4   Title Berg improved  10 points form baseline   Baseline 49/56   Time 6   Period Weeks   Status On-going         Short Term Clinic Goals - 09/03/14 1217    CC Short Term Goal  #1   Title short term goals= long term goals          PT Long Term Goals - 03/27/15 1348    PT LONG TERM GOAL #1   Title Independent with progressive HEP for further ROM, strengthening of Right UE.   Time 12   Period Weeks   Status On-going   PT LONG TERM GOAL #2   Title Right shoulder flexion and abduction AROM improved to 125 degrees needed for reaching eye level shelves in the kitchen.     Baseline AROM flex 110, abd 108   Time 12   Period Weeks   Status On-going   PT LONG TERM GOAL #3   Title Right UE strength grossly 4-/5 needed for basic cooking/cleaning for her 3 children.     Time 12   Period Weeks   Status Achieved   PT LONG TERM GOAL #4   Title FOTO improved 20 ppoints for indication of overall improved function   Baseline 59% today just less than 20 from 76%   Time 12   Period Weeks   Status Partially Met   PT LONG TERM GOAL #5   Title Patient will report overall improvement in pain level 3/10 level or better  and improve  function at 50%.   Baseline 7/10 today but sporadic but TDN helpful   Time 12   Period Weeks   Status On-going   PT LONG TERM GOAL #6   Title Pt will be able to manage pain at night in order to sleep 4 or more hours of restorative sleep. Pt will verbalize 3 pain management strategies for sleep/rest   Time 12   Period Weeks   Status Achieved   PT LONG TERM GOAL #7   Title Pt will incorporate walking program in to daily  life to encourage aerobic exercise for health  Pt to work up to 1/4 a mile   Time 12   Period Weeks   Status On-going           [place] - 10/23/14 1149    CC Long Term Goal  #3   Title Right arm circumference at 15 cm. proximal to olecranon will be reduced at least 0.5 cm.   Status Achieved   CC Long Term Goal  #4   Title right arm circumference at 15 cm provximal to olecranon will be reduced by 3 sm   Status Partially Met            Plan - 03/27/15 1344    Clinical Impression Statement Pt seems to benefit from trigger point dry needling and has increased Shoulder AROM and neck AROM even though pt comfort/pain is variable.  Pt has one more appt for dry needling before DC.  Emphasis on self care for next appt to maintain AROM  post D/C   Pt Cervical AROM WNL now.  Pt is able to reach opposite shoulder to perform ADl's with Right arm now.  Pt shoulder flex 110 abd 108 and ER 70, IR 47,   will continue for Goal achievement next visit   Pt will benefit from skilled therapeutic intervention in order to improve on the following deficits Impaired UE functional use;Increased fascial restricitons;Increased edema;Impaired  flexibility;Postural dysfunction;Obesity;Decreased scar mobility;Decreased range of motion          G-Codes - 2015/03/30 1347    Functional Assessment Tool Used FOTO   Functional Limitation Carrying, moving and handling objects   Carrying, Moving and Handling Objects Goal Status ([id number]) At least 40 percent but less than 60 percent impaired, limited or restricted   Carrying, Moving and Handling Objects Discharge Status [id number]) At least 40 percent but less than 60 percent impaired, limited or restricted      Problem List Patient Active Problem List   Diagnosis Date Noted  . Type 2 diabetes mellitus without complication ([place]) 63/06/6008  . Lower extremity edema 10/01/2014  . Breast cancer, right breast ([place]) 05/24/2014  . Myalgia and myositis 05/17/2014   . Neoplasm related pain 03/12/2014  . CN (constipation) 11/26/2013  . H/O reduction mammoplasty 10/30/2013  . Seizure ([place]) 10/06/2013  . Status post bilateral breast implants 08/31/2013  . Acquired absence of breast and absent nipple 08/23/2013  . S/P breast reconstruction, right 08/23/2013  . AC joint arthropathy 07/02/2013  . Routine adult health maintenance 05/29/2013  . Hypokalemia 05/21/2013  . Chronic pain 04/19/2013  . Elevated blood sugar 03/09/2013  . Wheezing 02/22/2013  . Acute bronchitis 02/22/2013  . Lymphedema 05/24/2011  . RHINOSINUSITIS, CHRONIC 04/08/2010  . Obesity 08/04/2006  . DEPRESSIVE DISORDER, NOS 08/04/2006  . RHINITIS, ALLERGIC 08/04/2006   Voncille Lo, PT 30-Mar-2015 1:50 PM Phone: [phone] Fax: [facility] [address] [place], [place], [address] Phone: [phone]   Fax:  [phone]  Name: Karen Hopkins MRN: [id number] Date of Birth: 02-09-1976

## 2015-04-01 ENCOUNTER — Encounter: Payer: Self-pay | Admitting: Registered Nurse

## 2015-04-01 ENCOUNTER — Encounter: Payer: Medicare Other | Attending: Registered Nurse | Admitting: Registered Nurse

## 2015-04-01 VITALS — BP 136/80 | HR 90 | Resp 14

## 2015-04-01 DIAGNOSIS — M7918 Myalgia, other site: Secondary | ICD-10-CM

## 2015-04-01 DIAGNOSIS — I890 Lymphedema, not elsewhere classified: Secondary | ICD-10-CM | POA: Insufficient documentation

## 2015-04-01 DIAGNOSIS — G893 Neoplasm related pain (acute) (chronic): Secondary | ICD-10-CM | POA: Diagnosis not present

## 2015-04-01 DIAGNOSIS — M545 Low back pain, unspecified: Secondary | ICD-10-CM

## 2015-04-01 DIAGNOSIS — M791 Myalgia: Secondary | ICD-10-CM

## 2015-04-01 DIAGNOSIS — G894 Chronic pain syndrome: Secondary | ICD-10-CM

## 2015-04-01 DIAGNOSIS — Z79899 Other long term (current) drug therapy: Secondary | ICD-10-CM

## 2015-04-01 DIAGNOSIS — M546 Pain in thoracic spine: Secondary | ICD-10-CM | POA: Diagnosis present

## 2015-04-01 DIAGNOSIS — Z5181 Encounter for therapeutic drug level monitoring: Secondary | ICD-10-CM

## 2015-04-01 MED ORDER — OXYCODONE HCL 10 MG PO TABS: 10.0000 mg | ORAL_TABLET | Freq: Three times a day (TID) | ORAL | Status: DC | PRN

## 2015-04-01 NOTE — Progress Notes (Signed)
Subjective:    Patient ID: Karen Hopkins, female    DOB: Oct 30, 1975, 39 y.o.   MRN: [id number]  HPI: [name] is a 39 year old female who returns for follow up for chronic pain and medication refill. She says her pain is located in her right shoulder and upper back.She rates her pain 6. Her current exercise regime is walking and performing stretching exercises. She's attending Physical Therapy for Dry Needling weekly.  Pain Inventory Average Pain 7 Pain Right Now 6 My pain is sharp, dull, stabbing, tingling and aching  In the last 24 hours, has pain interfered with the following? General activity 5 Relation with others 5 Enjoyment of life 5 What TIME of day is your pain at its worst? all Sleep (in general) Poor  Pain is worse with: walking, bending, inactivity, standing and some activites Pain improves with: therapy/exercise, medication and injections Relief from Meds: 4  Mobility walk without assistance how many minutes can you walk? 10 ability to climb steps?  yes do you drive?  yes Do you have any goals in this area?  yes  Function disabled: date disabled . Do you have any goals in this area?  no  Neuro/Psych numbness tingling spasms confusion depression anxiety  Prior Studies Any changes since last visit?  no  Physicians involved in your care Any changes since last visit?  no   Family History  Problem Relation Age of Onset  . Hypertension Mother   . Diabetes type II Father   . Prostate cancer Father    Social History   Social History  . Marital Status: Single    Spouse Name: N/A  . Number of Children: N/A  . Years of Education: N/A   Social History Main Topics  . Smoking status: Former Smoker -- 0.25 packs/day for 5 years    Quit date: 04/26/2010  . Smokeless tobacco: Never Used  . Alcohol Use: No  . Drug Use: No  . Sexual Activity: Not Currently    Birth Control/ Protection: Surgical   Other Topics Concern  . None   Social  History Narrative   Past Surgical History  Procedure Laterality Date  . Supra-umbilical hernia  8416  . Nasal septum surgery    . Latissimus flap to breast Right 03/12/2013    Procedure: RIGHT BREAST LATISSIMUS FLAP WITH EXPANDER PLACEMENT;  Surgeon: Theodoro Kos, DO;  Location: [facility];  Service: Plastics;  Laterality: Right;  . Cesarean section  07/15/2001; 03/18/2004; 11/21/2008  . Tubal ligation  11/21/2008  . Portacath placement Left 09/12/2009  . Modified radical mastectomy Right 03/11/2010  . Port-a-cath removal Left 12/01/2010  . Removal of tissue expander and placement of implant Right 08/23/2013    Procedure: REMOVAL RIGHT TISSUE EXPANDER AND PLACEMENT OF IMPLANT TO RIGHT BREAST ;  Surgeon: Theodoro Kos, DO;  Location: [facility];  Service: Plastics;  Laterality: Right;  . Breast reduction surgery Left 08/23/2013    Procedure: LEFT BREAST REDUCTION  ;  Surgeon: Theodoro Kos, DO;  Location: [facility];  Service: Plastics;  Laterality: Left;  . Liposuction Bilateral 08/23/2013    Procedure: LIPOSUCTION;  Surgeon: Theodoro Kos, DO;  Location: [facility];  Service: Plastics;  Laterality: Bilateral;   Past Medical History  Diagnosis Date  . Seasonal allergies   . Lymphedema of arm     right; no BP or puncture to right arm  . Depression   . Anxiety   .  Sinus headache   . Hypertension     under control with med., has been on med. x 2 yr.  . History of breast cancer 2011    right  . History of chemotherapy 2011  . History of radiation therapy 2011  . Cancer (HCC)    BP 136/80 mmHg  Pulse 90  Resp 14  SpO2 98%  Opioid Risk Score:   Fall Risk Score:  `1  Depression screen PHQ 2/9  Depression screen [facility] 2/9 03/04/2015 01/20/2015 01/07/2015 11/21/2014 10/07/2014 10/01/2014 09/13/2014  Decreased Interest 0 0 0 0 0 0 0  Down, Depressed, Hopeless 0 0 0 0 0 0 0  PHQ - 2 Score 0 0 0 0 0 0 0  Altered sleeping - - - - - - -  Tired, decreased energy -  - - - - - -  Change in appetite - - - - - - -  Feeling bad or failure about yourself  - - - - - - -  Trouble concentrating - - - - - - -  Moving slowly or fidgety/restless - - - - - - -  Suicidal thoughts - - - - - - -  PHQ-9 Score - - - - - - -     Review of Systems  Constitutional: Positive for unexpected weight change.  Neurological: Positive for numbness.       Tingling Spasms   Psychiatric/Behavioral: Positive for confusion and dysphoric mood. The patient is nervous/anxious.   All other systems reviewed and are negative.      Objective:   Physical Exam  Constitutional: She is oriented to [place], place, and time. She appears well-developed and well-nourished.  HENT:  Head: Normocephalic and atraumatic.  Neck: Normal range of motion. Neck supple.  Cardiovascular: Normal rate and regular rhythm.   Pulmonary/Chest: Effort normal and breath sounds normal.  Musculoskeletal:  Normal Muscle Bulk and Muscle Testing Reveals: Upper Extremities: Right: Decreased ROM 90 Degrees and Muscle Strength 5/5 Left: Full ROM and Muscle Strength 5/5 Right AC Joint Tenderness Thoracic Paraspinal Tenderness: T-1- T-3 T-5- T-7 Lumbar Paraspinal Tenderness: L-3- L-5 Lower Extremities: Full ROM and Muscle Strength 5/5 Arises from chair with ease Narrow Based gait  Neurological: She is alert and oriented to [place], place, and time.  Skin: Skin is warm and dry.  Psychiatric: She has a normal mood and affect.  Nursing note and vitals reviewed.         Assessment & Plan:  1.Myofascial pain syndrome chronic postoperative, as well as post radiation: S/P Breast Reconstruction Surgery x 2.  Refilled: Oxycodone 10 mg one tablet every 8 hours as needed for severe pain.# 90. Continue with Exercise Regime. 2. Obesity: Losing weight continue with Healthy Diet and Exercise Regime.   20 minutes of face to face patient care time was spent during this visit. All questions were encouraged and  answered

## 2015-04-03 ENCOUNTER — Telehealth: Payer: Self-pay | Admitting: Oncology

## 2015-04-03 ENCOUNTER — Other Ambulatory Visit: Payer: Self-pay | Admitting: Oncology

## 2015-04-03 ENCOUNTER — Ambulatory Visit: Payer: Medicare Other | Admitting: Physical Therapy

## 2015-04-03 DIAGNOSIS — I890 Lymphedema, not elsewhere classified: Secondary | ICD-10-CM

## 2015-04-03 DIAGNOSIS — E8989 Other postprocedural endocrine and metabolic complications and disorders: Secondary | ICD-10-CM

## 2015-04-03 DIAGNOSIS — M797 Fibromyalgia: Secondary | ICD-10-CM | POA: Diagnosis not present

## 2015-04-03 DIAGNOSIS — M62838 Other muscle spasm: Secondary | ICD-10-CM

## 2015-04-03 DIAGNOSIS — M25611 Stiffness of right shoulder, not elsewhere classified: Secondary | ICD-10-CM

## 2015-04-03 DIAGNOSIS — M542 Cervicalgia: Secondary | ICD-10-CM

## 2015-04-03 DIAGNOSIS — M5489 Other dorsalgia: Secondary | ICD-10-CM

## 2015-04-03 DIAGNOSIS — R293 Abnormal posture: Secondary | ICD-10-CM

## 2015-04-03 DIAGNOSIS — M7918 Myalgia, other site: Secondary | ICD-10-CM

## 2015-04-03 DIAGNOSIS — M6281 Muscle weakness (generalized): Secondary | ICD-10-CM

## 2015-04-03 NOTE — Therapy (Signed)
[place], [place], [address] Phone: [phone]   Fax:  [phone]  Physical Therapy Treatment/Discharge Note  Patient Details  Name: Karen Hopkins MRN: [id number] Date of Birth: 1976/02/14 Referring Provider: [name], MD  Encounter Date: 04/03/2015      PT End of Session - 04/03/15 1032    Visit Number 11   Number of Visits 13   Date for PT Re-Evaluation 04/03/15   Authorization Type Medicare KX   Authorization Time Period 01-08-15 to 04-03-15      Past Medical History  Diagnosis Date  . Seasonal allergies   . Lymphedema of arm     right; no BP or puncture to right arm  . Depression   . Anxiety   . Sinus headache   . Hypertension     under control with med., has been on med. x 2 yr.  . History of breast cancer 2011    right  . History of chemotherapy 2011  . History of radiation therapy 2011  . Cancer [facility])     Past Surgical History  Procedure Laterality Date  . Supra-umbilical hernia  1829  . Nasal septum surgery    . Latissimus flap to breast Right 03/12/2013    Procedure: RIGHT BREAST LATISSIMUS FLAP WITH EXPANDER PLACEMENT;  Surgeon: Theodoro Kos, DO;  Location: [facility];  Service: Plastics;  Laterality: Right;  . Cesarean section  07/15/2001; 03/18/2004; 11/21/2008  . Tubal ligation  11/21/2008  . Portacath placement Left 09/12/2009  . Modified radical mastectomy Right 03/11/2010  . Port-a-cath removal Left 12/01/2010  . Removal of tissue expander and placement of implant Right 08/23/2013    Procedure: REMOVAL RIGHT TISSUE EXPANDER AND PLACEMENT OF IMPLANT TO RIGHT BREAST ;  Surgeon: Theodoro Kos, DO;  Location: [facility];  Service: Plastics;  Laterality: Right;  . Breast reduction surgery Left 08/23/2013    Procedure: LEFT BREAST REDUCTION  ;  Surgeon: Theodoro Kos, DO;  Location: [place];  Service: Plastics;  Laterality: Left;  . Liposuction  Bilateral 08/23/2013    Procedure: LIPOSUCTION;  Surgeon: Theodoro Kos, DO;  Location: [facility];  Service: Plastics;  Laterality: Bilateral;    There were no vitals filed for this visit.  Visit Diagnosis:  Myofascial muscle pain  Right-sided back pain, unspecified location  Abnormal posture  Stiffness of joint, shoulder region, right  Lymphedema of upper extremity following lymphadenectomy  Muscle spasms of neck  Neck pain on right side  Shoulder joint stiffness, right  Muscle weakness of right upper extremity      Subjective Assessment - 04/03/15 1031    Subjective I did not sleep well llast night.  I am stressed and I am concerned about my swelling on my Right chest.     Pertinent History Right breast cancer with mastectomy 2011; reconstruction; left breast reduction; h/o right UE lymphedema and right upper quadrant pain, treated in this clinic several times before.  Recent scare with left breeast, but but ultrasound and mammogram were negative.   How long can you sit comfortably? numbness    How long can you stand comfortably? 30 min to 45 min   varies   How long can you walk comfortably?  30 to 45 min varies   Diagnostic tests mammogram, U/S;  another follow up with left breast due now   Patient Stated Goals decrease pain and use arm regularly (right hand dominant) and be able  to handle my pain at home    Currently in Pain? Yes   Pain Score 6    Pain Location Shoulder  upper trap   Pain Orientation Right   Pain Descriptors / Indicators Aching   Pain Type Chronic pain   Pain Onset More than a month ago   Pain Score 6   Pain Location Neck   Pain Orientation Right   Pain Descriptors / Indicators Aching   Pain Type Chronic pain   Pain Frequency Constant            [facility] PT Assessment - 04/03/15 1029    Observation/Other Assessments   Observations pt wth left swelling over left trap due to cat scratch.    Focus on Therapeutic Outcomes (FOTO)   limitation 59%  Eval 76%   AROM   Right Shoulder Extension 30 Degrees  passive 42 degrees   Right Shoulder Flexion 114 Degrees  PROM 122, tight at scar   Right Shoulder ABduction 110 Degrees  Pain limitiing   Right Shoulder Internal Rotation 47 Degrees  ERP   Right Shoulder External Rotation 70 Degrees  ERP   Left Shoulder Horizontal ABduction 50 Degrees   Left Shoulder Horizontal ADduction 50 Degrees   Cervical Flexion 55   Cervical Extension 60   Cervical - Right Side Bend 46   Cervical - Left Side Bend 50   Cervical - Right Rotation 70   Cervical - Left Rotation 70   Strength   Right Shoulder Flexion 4+/5   Right Shoulder Extension 4+/5   Right Shoulder ABduction 4/5   Right Shoulder Internal Rotation 4+/5   Right Shoulder External Rotation 4+/5                     [facility] Adult PT Treatment/Exercise - 04/03/15 1029    Shoulder Exercises: Standing   Other Standing Exercises Wall slides with LEft and right stepping/ kinetic chain activation x 20 each   Other Standing Exercises PNF diagonals with blue T band, wall clock,  finger ladder to level 18 without pain.     Manual Therapy   Manual Therapy Soft tissue mobilization;Myofascial release   Joint Mobilization 1st rib mobs grade 3/4    Soft tissue mobilization upper trap , levator and rhomboid Right   Scapular Mobilization scapular med/lat/sup/inf glides and distraction grade 3/4   Other Manual Therapy Pt reviewed in use of tennis ball for self myofascial release techniique over pectoral, teres major, upper trap  bil.   Kinesiotix   Inhibit Muscle  --   Neck Exercises: Stretches   Neck Stretch 2 reps;30 seconds   Corner Stretch 2 reps;30 seconds   Chest Stretch 2 reps;30 seconds   Other Neck Stretches neck retraction x 5  5sec stretch.                PT Education - 04/03/15 1410    Education provided Yes   Education Details discussed HEP and self myofascial release for reoccuring fascial pain.   Discussed liimitations of trigger point dry needling and need to return to MD for superior clavicular swelling   Pick(s) Educated Patient   Methods Explanation;Demonstration;Verbal cues;Tactile cues   Comprehension Verbalized understanding;Returned demonstration          PT Short Term Goals - 04/03/15 1420    PT SHORT TERM GOAL #1   Title Patient will report a 25% reduction in pain with usual daily activities and home chores.   Baseline fluctuates from  4/10 to 8/10, 6/10 today   Time 6   Period Weeks   Status Partially Met   PT SHORT TERM GOAL #2   Title Patient will be able to resume a basic home ex program with minimal increase in pain    Baseline pain tends to fluctuate due to R upper trap compensatory spasms   Time 6   Period Weeks   Status Partially Met   PT SHORT TERM GOAL #3   Title Right shoulder flexion improved to  115 degrees needed for reaching shoulder height shelves.   Baseline Right shoulder flex 114, abd 110   Time 6   Period Weeks   Status Partially Met   PT SHORT TERM GOAL #4   Title Berg improved  10 points form baseline   Baseline 49/56   Time 6   Period Weeks   Status Partially Met         Short Term Clinic Goals - 09/03/14 1217    CC Short Term Goal  #1   Title short term goals= long term goals          PT Long Term Goals - 04/03/15 1422    PT LONG TERM GOAL #1   Title Independent with progressive HEP for further ROM, strengthening of Right UE.   Time 12   Period Weeks   Status Achieved   PT LONG TERM GOAL #2   Title Right shoulder flexion and abduction AROM improved to 125 degrees needed for reaching eye level shelves in the kitchen.     Baseline AROM flex 114, abd 110   Time 12   Period Weeks   Status Partially Met   PT LONG TERM GOAL #3   Title Right UE strength grossly 4-/5 needed for basic cooking/cleaning for her 3 children.     Baseline Right UE 4/5 to 4+/5   Time 12   Period Weeks   Status Achieved   PT LONG TERM GOAL #4    Title FOTO improved 20 ppoints for indication of overall improved function   Baseline 59% today just less than 20 from 76%   Time 12   Period Weeks   Status Partially Met   PT LONG TERM GOAL #5   Title Patient will report overall improvement in pain level 3/10 level or better  and improve  function at 50%.   Baseline 6/10 but sporadic 4/10 to 8/10  Pt able to perform personal hygiene with 50% ease   Time 12   Period Weeks   Status Partially Met   PT LONG TERM GOAL #6   Title Pt will be able to manage pain at night in order to sleep 4 or more hours of restorative sleep. Pt will verbalize 3 pain management strategies for sleep/rest   Time 12   Period Weeks   Status Achieved   PT LONG TERM GOAL #7   Title Pt will incorporate walking program in to daily life to encourage aerobic exercise for health  Pt to work up to 1/4 a mile   Baseline Pt knowledgeable about walking program.  but busyness of life prevents completion   Time 12   Period Weeks   Status Partially [place] - 10/23/14 1149    CC Long Term Goal  #3   Title Right arm circumference at 15 cm. proximal to olecranon will be reduced at least 0.5 cm.   Status Achieved  CC Long Term Goal  #4   Title right arm circumference at 15 cm provximal to olecranon will be reduced by 3 sm   Status Partially Met            Plan - 04/03/15 1414    Clinical Impression Statement Pt seems to recieve temporary relief from trigger point dry needling but no longterm effect for use of  self myofascial release.  Pt has been stressed and finds it difficult to be compliant with HEP since she is caring for sone and has to travel to hight school in [place].  Pt  has  also elevated Right upper trap due to compensation for reconstructrive surgery for  brest reconstruction.  Pt  was reviewed on neck tension exercises  and has tools for home use.  Pt  likes deep tissue massage and would benefit from routine massage  to cope with pain.  Pt however has  swelling in R supraclavicular area.  Last week left supra clavicaular area was affected by cat scractch but now resolved.  Pt was encouraged to return to MD for  swelling.  Marland Kitchen Pt AROM slightly increased in Flex and abd.  Pt  is aware of importance of consistent exercise but the busyness of life  prevents  Ms. Brockwell from completing.   Pt is a joy for whom to serve and we wiish her well has she continue her HEP for pain control and  relieft.   Pt will benefit from skilled therapeutic intervention in order to improve on the following deficits Impaired UE functional use;Increased fascial restricitons;Increased edema;Impaired flexibility;Postural dysfunction;Obesity;Decreased scar mobility;Decreased range of motion   Clinical Impairments Affecting Rehab Potential multiple medical and chronic pain issues   PT Next Visit Plan DC   PT Home Exercise Plan Reviewed HEP        Problem List Patient Active Problem List   Diagnosis Date Noted  . Type 2 diabetes mellitus without complication ([facility]) 68/04/5725  . Lower extremity edema 10/01/2014  . Breast cancer, right breast ([place]) 05/24/2014  . Myalgia and myositis 05/17/2014  . Neoplasm related pain 03/12/2014  . CN (constipation) 11/26/2013  . H/O reduction mammoplasty 10/30/2013  . Seizure ([facility]) 10/06/2013  . Status post bilateral breast implants 08/31/2013  . Acquired absence of breast and absent nipple 08/23/2013  . S/P breast reconstruction, right 08/23/2013  . AC joint arthropathy 07/02/2013  . Routine adult health maintenance 05/29/2013  . Hypokalemia 05/21/2013  . Chronic pain 04/19/2013  . Elevated blood sugar 03/09/2013  . Wheezing 02/22/2013  . Acute bronchitis 02/22/2013  . Lymphedema 05/24/2011  . RHINOSINUSITIS, CHRONIC 04/08/2010  . Obesity 08/04/2006  . DEPRESSIVE DISORDER, NOS 08/04/2006  . RHINITIS, ALLERGIC 08/04/2006   Voncille Lo, PT 04/03/2015 2:30 PM Phone: [phone] Fax:  [facility] [place], [place], [address] Phone: [phone]   Fax:  [phone]  Name: Karen Hopkins MRN: [id number] Date of Birth: February 14, 1976   PHYSICAL THERAPY DISCHARGE SUMMARY  Visits from Start of Care: 11  Current functional level related to goals / functional outcomes: See goals above   Remaining deficits: Pt with swelling supra clavicular on Right.  Pain fluctuates from 4/10 to 8/10.  See goals above   Education / Equipment: HEP, theraband and tennis ball and self myofascial Plan: Patient agrees to discharge.  Patient goals were partially met. Patient is being discharged due to                                                     ?????  maximizing benefit of skilled PT at present.            Voncille Lo, PT 04/03/2015 2:30 PM Phone: [phone] Fax: [phone]

## 2015-04-03 NOTE — Telephone Encounter (Signed)
Called patient and she is aware of her appointments °

## 2015-04-07 ENCOUNTER — Ambulatory Visit (INDEPENDENT_AMBULATORY_CARE_PROVIDER_SITE_OTHER): Payer: Medicare Other | Admitting: *Deleted

## 2015-04-07 DIAGNOSIS — Z23 Encounter for immunization: Secondary | ICD-10-CM

## 2015-04-08 ENCOUNTER — Ambulatory Visit: Payer: Medicare Other | Admitting: Oncology

## 2015-04-08 ENCOUNTER — Ambulatory Visit: Payer: Medicare Other

## 2015-04-08 ENCOUNTER — Other Ambulatory Visit: Payer: Medicare Other

## 2015-04-08 NOTE — Progress Notes (Signed)
No show

## 2015-04-09 ENCOUNTER — Telehealth: Payer: Self-pay | Admitting: Oncology

## 2015-04-09 ENCOUNTER — Telehealth: Payer: Self-pay | Admitting: Nurse Practitioner

## 2015-04-09 NOTE — Telephone Encounter (Signed)
Appointments added and injection moved,calendar and letter mailed to patient

## 2015-04-09 NOTE — Telephone Encounter (Signed)
Patient called in to reschedule her missed appointment °

## 2015-04-10 ENCOUNTER — Ambulatory Visit (HOSPITAL_BASED_OUTPATIENT_CLINIC_OR_DEPARTMENT_OTHER): Payer: Medicare Other

## 2015-04-10 VITALS — BP 126/71 | HR 84 | Temp 98.1°F

## 2015-04-10 DIAGNOSIS — I890 Lymphedema, not elsewhere classified: Secondary | ICD-10-CM

## 2015-04-10 DIAGNOSIS — R739 Hyperglycemia, unspecified: Secondary | ICD-10-CM

## 2015-04-10 DIAGNOSIS — C50911 Malignant neoplasm of unspecified site of right female breast: Secondary | ICD-10-CM | POA: Diagnosis present

## 2015-04-10 DIAGNOSIS — Z5111 Encounter for antineoplastic chemotherapy: Secondary | ICD-10-CM | POA: Diagnosis not present

## 2015-04-10 DIAGNOSIS — E669 Obesity, unspecified: Secondary | ICD-10-CM

## 2015-04-10 LAB — COMPREHENSIVE METABOLIC PANEL (CC13)
ALT: 21 U/L (ref 0–55)
ANION GAP: 8 meq/L (ref 3–11)
AST: 15 U/L (ref 5–34)
Albumin: 3.5 g/dL (ref 3.5–5.0)
Alkaline Phosphatase: 75 U/L (ref 40–150)
BUN: 13.7 mg/dL (ref 7.0–26.0)
CALCIUM: 9.3 mg/dL (ref 8.4–10.4)
CHLORIDE: 106 meq/L (ref 98–109)
CO2: 28 meq/L (ref 22–29)
CREATININE: 0.7 mg/dL (ref 0.6–1.1)
Glucose: 78 mg/dl (ref 70–140)
POTASSIUM: 3.3 meq/L — AB (ref 3.5–5.1)
Sodium: 142 mEq/L (ref 136–145)
Total Bilirubin: <0.3 mg/dL (ref 0.20–1.20)
Total Protein: 7.1 g/dL (ref 6.4–8.3)

## 2015-04-10 LAB — CBC WITH DIFFERENTIAL/PLATELET
BASO%: 0.3 % (ref 0.0–2.0)
BASOS ABS: 0 10*3/uL (ref 0.0–0.1)
EOS%: 8.1 % — ABNORMAL HIGH (ref 0.0–7.0)
Eosinophils Absolute: 0.9 10*3/uL — ABNORMAL HIGH (ref 0.0–0.5)
HEMATOCRIT: 38.1 % (ref 34.8–46.6)
HGB: 12.5 g/dL (ref 11.6–15.9)
LYMPH#: 3.4 10*3/uL — AB (ref 0.9–3.3)
LYMPH%: 32 % (ref 14.0–49.7)
MCH: 28.3 pg (ref 25.1–34.0)
MCHC: 32.8 g/dL (ref 31.5–36.0)
MCV: 86.5 fL (ref 79.5–101.0)
MONO#: 0.7 10*3/uL (ref 0.1–0.9)
MONO%: 6.4 % (ref 0.0–14.0)
NEUT#: 5.7 10*3/uL (ref 1.5–6.5)
NEUT%: 53.2 % (ref 38.4–76.8)
PLATELETS: 283 10*3/uL (ref 145–400)
RBC: 4.4 10*6/uL (ref 3.70–5.45)
RDW: 14.8 % — ABNORMAL HIGH (ref 11.2–14.5)
WBC: 10.7 10*3/uL — ABNORMAL HIGH (ref 3.9–10.3)

## 2015-04-10 MED ORDER — GOSERELIN ACETATE 3.6 MG ~~LOC~~ IMPL
3.6000 mg | DRUG_IMPLANT | Freq: Once | SUBCUTANEOUS | Status: AC
  Administered 2015-04-10: 3.6 mg via SUBCUTANEOUS
  Filled 2015-04-10: qty 3.6

## 2015-04-14 ENCOUNTER — Telehealth: Payer: Self-pay | Admitting: *Deleted

## 2015-04-14 NOTE — Telephone Encounter (Signed)
"  I know I missed Magrinat's appointment but I need an order for Lymphedema clinic.  Jerrye Beavers may have already e-mailed Dr. Jana Hakim.  My arm is swollen down to my hand.  It's affecting my daily life."  return number [phone].  Next F/U 05-07-2015 with Nira Conn NP.

## 2015-04-15 ENCOUNTER — Other Ambulatory Visit: Payer: Self-pay

## 2015-04-15 DIAGNOSIS — G8929 Other chronic pain: Secondary | ICD-10-CM

## 2015-04-15 DIAGNOSIS — Z9889 Other specified postprocedural states: Secondary | ICD-10-CM

## 2015-04-15 DIAGNOSIS — I890 Lymphedema, not elsewhere classified: Secondary | ICD-10-CM

## 2015-04-15 DIAGNOSIS — C50911 Malignant neoplasm of unspecified site of right female breast: Secondary | ICD-10-CM

## 2015-04-15 NOTE — Progress Notes (Signed)
Order placed by Probation officer for PT for her lymphedema.  Patient aware and will call them if she doesn't hear from them by the end of today.

## 2015-04-18 ENCOUNTER — Ambulatory Visit: Payer: Medicare Other | Attending: Physical Medicine & Rehabilitation | Admitting: Physical Therapy

## 2015-04-18 DIAGNOSIS — I890 Lymphedema, not elsewhere classified: Secondary | ICD-10-CM | POA: Diagnosis present

## 2015-04-18 DIAGNOSIS — E8989 Other postprocedural endocrine and metabolic complications and disorders: Secondary | ICD-10-CM | POA: Insufficient documentation

## 2015-04-18 NOTE — Therapy (Signed)
[place], [place], [address] Phone: [phone]   Fax:  [phone]  Physical Therapy Evaluation  Patient Details  Name: Karen Hopkins MRN: [id number] Date of Birth: 1975-12-21 Referring Provider: Jana Hakim   Encounter Date: 04/18/2015      PT End of Session - 04/18/15 1242    Visit Number 1   Number of Visits 13  needs KX   Date for PT Re-Evaluation 05/18/15   PT Start Time 1020   PT Stop Time 1100   PT Time Calculation (min) 40 min   Activity Tolerance Patient tolerated treatment well   Behavior During Therapy [facility] for tasks assessed/performed      Past Medical History  Diagnosis Date  . Seasonal allergies   . Lymphedema of arm     right; no BP or puncture to right arm  . Depression   . Anxiety   . Sinus headache   . Hypertension     under control with med., has been on med. x 2 yr.  . History of breast cancer 2011    right  . History of chemotherapy 2011  . History of radiation therapy 2011  . Cancer [facility])     Past Surgical History  Procedure Laterality Date  . Supra-umbilical hernia  7510  . Nasal septum surgery    . Latissimus flap to breast Right 03/12/2013    Procedure: RIGHT BREAST LATISSIMUS FLAP WITH EXPANDER PLACEMENT;  Surgeon: Theodoro Kos, DO;  Location: [place];  Service: Plastics;  Laterality: Right;  . Cesarean section  07/15/2001; 03/18/2004; 11/21/2008  . Tubal ligation  11/21/2008  . Portacath placement Left 09/12/2009  . Modified radical mastectomy Right 03/11/2010  . Port-a-cath removal Left 12/01/2010  . Removal of tissue expander and placement of implant Right 08/23/2013    Procedure: REMOVAL RIGHT TISSUE EXPANDER AND PLACEMENT OF IMPLANT TO RIGHT BREAST ;  Surgeon: Theodoro Kos, DO;  Location: [place];  Service: Plastics;  Laterality: Right;  . Breast reduction surgery Left 08/23/2013    Procedure: LEFT BREAST REDUCTION  ;  Surgeon: Theodoro Kos,  DO;  Location: [place];  Service: Plastics;  Laterality: Left;  . Liposuction Bilateral 08/23/2013    Procedure: LIPOSUCTION;  Surgeon: Theodoro Kos, DO;  Location: [place];  Service: Plastics;  Laterality: Bilateral;    There were no vitals filed for this visit.  Visit Diagnosis:  Lymphedema of upper extremity following lymphadenectomy - Plan: PT plan of care cert/re-cert      Subjective Assessment - 04/18/15 1030    Subjective right is swollen up for about 3 weeks Has not been able to get daytimes sleeve on because it cuts in. She does not use her nighttime sleeve because her fingers go numb.  She has not used her Flexitouch in 2 weeks because she does not feel it presses deep enough  She states she tries to walk, but she is just too tired,      Pertinent History Right breast cancer with mastectomy 2011; reconstruction; left breast reduction; h/o right UE lymphedema and right upper quadrant pain, treated in this clinic several times before.  Recent scare with left breeast, but but ultrasound and mammogram were negative.   Currently in Pain? Yes  "the ususal"   Pain Score 5    Pain Location Shoulder  upper trap             [facility] PT Assessment - 04/18/15 0001  Assessment   Medical Diagnosis breast cancer    Referring Provider magrinat    Onset Date/Surgical Date 08/06/10  approximate    Hand Dominance Right   Precautions   Precautions Other (comment)   Precaution Comments Limit heavy lifting   Balance Screen   Has the patient fallen in the past 6 months Yes   How many times? 1  knees gave out    Has the patient had a decrease in activity level because of a fear of falling?  Yes  falls will not be addressed this visit   Is the patient reluctant to leave their home because of a fear of falling?  No   Home [employer] residence   Living Arrangements Children  3 boys   Prior Function   Level of Independence  Independent with basic ADLs   Vocation On disability   Leisure grocery store shopping, church   Cognition   Overall Cognitive Status Within Functional Limits for tasks assessed   Observation/Other Assessments   Observations pt appears to have increased swelling in right upper arm    Focus on Therapeutic Outcomes (FOTO)  limitation 59%  Eval 76%   Sensation   Additional Comments numbness in all fingertips(not thumb) on right hand    Coordination   Gross Motor Movements are Fluid and Coordinated Yes   Other:   Other/ Comments Lymphedema Life Impact Scale Score 55 or 81% impairment    Posture/Postural Control   Posture/Postural Control Postural limitations   Postural Limitations Rounded Shoulders;Forward head;Increased thoracic kyphosis   AROM   Right Shoulder Extension --   Right Shoulder Flexion --   Right Shoulder ABduction --   Right Shoulder Internal Rotation --   Right Shoulder External Rotation --   Left Shoulder Horizontal ABduction --   Left Shoulder Horizontal ADduction --   Cervical Flexion --   Cervical Extension --   Cervical - Right Side Bend --   Cervical - Left Side Bend --   Cervical - Right Rotation --   Cervical - Left Rotation --   Strength   Right Shoulder Flexion --   Right Shoulder Extension --   Right Shoulder ABduction --   Right Shoulder Internal Rotation --   Right Shoulder External Rotation --           LYMPHEDEMA/ONCOLOGY QUESTIONNAIRE - 04/18/15 1048    Right Upper Extremity Lymphedema   At Axilla  43 cm   15 cm Proximal to Olecranon Process 50 cm   10 cm Proximal to Olecranon Process 49.5 cm   Olecranon Process 33.7 cm   15 cm Proximal to Ulnar Styloid Process 33.5 cm   10 cm Proximal to Ulnar Styloid Process 29.5 cm   Just Proximal to Ulnar Styloid Process 18.3 cm   Across Hand at [employer] 20.5 cm   At [place] of 2nd Digit 6 cm   Left Upper Extremity Lymphedema   15 cm Proximal to Olecranon Process 47.5 cm   10 cm Proximal to  Olecranon Process 45.5 cm   Olecranon Process 30.9 cm   15 cm Proximal to Ulnar Styloid Process 31.7 cm   10 cm Proximal to Ulnar Styloid Process 27 cm   Just Proximal to Ulnar Styloid Process 18 cm   Across Hand at [employer] 20.5 cm   At [employer] of 2nd Digit 6 cm                [facility] Adult  PT Treatment/Exercise - 04/18/15 0001    Self-Care   Self-Care Other Self-Care Comments   Other Self-Care Comments  issued large tg soft for upper arm and medium for lower arm for symptomatic relief                 PT Education - 04/18/15 1243    Education provided Yes   Education Details support groups at [employer]) Educated Patient   Methods Explanation   Comprehension Verbalized understanding          PT Short Term Goals - 04/03/15 1420    PT SHORT TERM GOAL #1   Title Patient will report a 25% reduction in pain with usual daily activities and home chores.   Baseline fluctuates from 4/10 to 8/10, 6/10 today   Time 6   Period Weeks   Status Partially Met   PT SHORT TERM GOAL #2   Title Patient will be able to resume a basic home ex program with minimal increase in pain    Baseline pain tends to fluctuate due to R upper trap compensatory spasms   Time 6   Period Weeks   Status Partially Met   PT SHORT TERM GOAL #3   Title Right shoulder flexion improved to  115 degrees needed for reaching shoulder height shelves.   Baseline Right shoulder flex 114, abd 110   Time 6   Period Weeks   Status Partially Met   PT SHORT TERM GOAL #4   Title Berg improved  10 points form baseline   Baseline 49/56   Time 6   Period Weeks   Status Partially Met         Short Term Clinic Goals - 09/03/14 1217    CC Short Term Goal  #1   Title short term goals= long term goals          PT Long Term Goals - 04/03/15 1422    PT LONG TERM GOAL #1   Title Independent with progressive HEP for further ROM, strengthening of Right UE.   Time 12   Period Weeks   Status  Achieved   PT LONG TERM GOAL #2   Title Right shoulder flexion and abduction AROM improved to 125 degrees needed for reaching eye level shelves in the kitchen.     Baseline AROM flex 114, abd 110   Time 12   Period Weeks   Status Partially Met   PT LONG TERM GOAL #3   Title Right UE strength grossly 4-/5 needed for basic cooking/cleaning for her 3 children.     Baseline Right UE 4/5 to 4+/5   Time 12   Period Weeks   Status Achieved   PT LONG TERM GOAL #4   Title FOTO improved 20 ppoints for indication of overall improved function   Baseline 59% today just less than 20 from 76%   Time 12   Period Weeks   Status Partially Met   PT LONG TERM GOAL #5   Title Patient will report overall improvement in pain level 3/10 level or better  and improve  function at 50%.   Baseline 6/10 but sporadic 4/10 to 8/10  Pt able to perform personal hygiene with 50% ease   Time 12   Period Weeks   Status Partially Met   PT LONG TERM GOAL #6   Title Pt will be able to manage pain at night in order to sleep 4 or more hours of restorative  sleep. Pt will verbalize 3 pain management strategies for sleep/rest   Time 12   Period Weeks   Status Achieved   PT LONG TERM GOAL #7   Title Pt will incorporate walking program in to daily life to encourage aerobic exercise for health  Pt to work up to 1/4 a mile   Baseline Pt knowledgeable about walking program.  but busyness of life prevents completion   Time 12   Period Weeks   Status Partially [facility] Goals - Apr 24, 2015 1248    CC Long Term Goal  #1   Title Right arm at 15 cm above the olecranon will decrease by 1 cm   (target 05/29/2015)   Baseline 50    Time 4   Period Weeks   Status New   CC Long Term Goal  #2   Title Pt will verbalize she knows what to do to manage her lymphedema at home and a plan for how she will do that. ( 05/18/2015)   Time 4   Period Weeks   Status New            Plan - 04-24-2015 1244     Clinical Impression Statement Pt retuns to PT as her upper arm is swollen again. She has not been able to follow through with home management strategies because her arm is too big to fit in them now among other reaseons as outlined in eval. She was tearful and is discouraged by continuation of symptoms.  Suggested support group as an option for her.  The only thing she does not have at home is a bandaging aloernative option that can be applied more earily than bandages.    Pt will benefit from skilled therapeutic intervention in order to improve on the following deficits Increased edema   Rehab Potential Good   Clinical Impairments Affecting Rehab Potential multiple medical and chronic pain issues   PT Frequency 3x / week   PT Duration 4 weeks   PT Treatment/Interventions ADLs/Self Care Home Management;Compression bandaging;Patient/family education;Manual techniques;Manual lymph drainage   PT Next Visit Plan manual lymph drainage and compression bandaging.  Asked pt to bring her nighttime garment so it could be evaluated for fit.    Consulted and Agree with Plan of Care Patient          G-Codes - 04-24-15 1251    Functional Assessment Tool Used Lymphedema life impact scale score of 55 or 81% impairment    Functional Limitation Self care   Self Care Current Status (V6720) At least 80 percent but less than 100 percent impaired, limited or restricted   Self Care Goal Status (N4709) At least 40 percent but less than 60 percent impaired, limited or restricted       Problem List Patient Active Problem List   Diagnosis Date Noted  . Type 2 diabetes mellitus without complication ([place]) 62/83/6629  . Lower extremity edema 10/01/2014  . Breast cancer, right breast ([place]) 05/24/2014  . Myalgia and myositis 05/17/2014  . Neoplasm related pain 03/12/2014  . CN (constipation) 11/26/2013  . H/O reduction mammoplasty 10/30/2013  . Seizure ([place]) 10/06/2013  . Status post bilateral breast implants  08/31/2013  . Acquired absence of breast and absent nipple 08/23/2013  . S/P breast reconstruction, right 08/23/2013  . AC joint arthropathy 07/02/2013  . Routine adult health maintenance 05/29/2013  . Hypokalemia 05/21/2013  . Chronic pain 04/19/2013  . Elevated blood  sugar 03/09/2013  . Wheezing 02/22/2013  . Acute bronchitis 02/22/2013  . Lymphedema 05/24/2011  . RHINOSINUSITIS, CHRONIC 04/08/2010  . Obesity 08/04/2006  . DEPRESSIVE DISORDER, NOS 08/04/2006  . RHINITIS, ALLERGIC 08/04/2006   Donato Heinz. Owens Shark, PT   04/18/2015, 12:54 PM  [place], [place], [address] Phone: [phone]   Fax:  [phone]  Name: Neliah Cuyler Datta MRN: [id number] Date of Birth: 1976-05-24

## 2015-04-19 ENCOUNTER — Other Ambulatory Visit: Payer: Self-pay | Admitting: Oncology

## 2015-04-21 ENCOUNTER — Other Ambulatory Visit: Payer: Self-pay | Admitting: *Deleted

## 2015-04-21 ENCOUNTER — Other Ambulatory Visit: Payer: Self-pay | Admitting: Oncology

## 2015-04-21 ENCOUNTER — Other Ambulatory Visit: Payer: Self-pay

## 2015-04-21 MED ORDER — ALPRAZOLAM 1 MG PO TABS: ORAL_TABLET | ORAL | Status: DC

## 2015-04-21 NOTE — Telephone Encounter (Signed)
This RN received call from pt stating " I was told my xanax refill was denied because I need to schedule an appointment "  Refill request was sent on 11/10- pt is now out of medication.  Noted appointment scheduled on 11/30 with HB/NP.  Pt is currently under lymphedema therapy for known recurrent lymphedema.  Refill obtained and called to pharmacy.

## 2015-04-22 ENCOUNTER — Ambulatory Visit: Payer: Medicare Other

## 2015-04-22 DIAGNOSIS — I890 Lymphedema, not elsewhere classified: Principal | ICD-10-CM

## 2015-04-22 DIAGNOSIS — E8989 Other postprocedural endocrine and metabolic complications and disorders: Secondary | ICD-10-CM | POA: Diagnosis not present

## 2015-04-22 NOTE — Therapy (Signed)
[place], [place], [address] Phone: [phone]   Fax:  [phone]  Physical Therapy Treatment  Patient Details  Name: Karen Hopkins MRN: [id number] Date of Birth: April 13, 1976 Referring Provider: Jana Hakim   Encounter Date: 04/22/2015      PT End of Session - 04/22/15 1214    Visit Number 2   Number of Visits 13  needs KX   Date for PT Re-Evaluation 05/18/15   Authorization Type Medicare KX   Authorization Time Period 01-08-15 to 04-03-15   PT Start Time 1117   PT Stop Time 1208   PT Time Calculation (min) 51 min   Activity Tolerance Patient tolerated treatment well   Behavior During Therapy [facility] for tasks assessed/performed;Restless  Pt did not want to talk about other options to help her get into a routine regarding exercise and would become restless during this time and wanted to change the subject.      Past Medical History  Diagnosis Date  . Seasonal allergies   . Lymphedema of arm     right; no BP or puncture to right arm  . Depression   . Anxiety   . Sinus headache   . Hypertension     under control with med., has been on med. x 2 yr.  . History of breast cancer 2011    right  . History of chemotherapy 2011  . History of radiation therapy 2011  . Cancer [facility])     Past Surgical History  Procedure Laterality Date  . Supra-umbilical hernia  4076  . Nasal septum surgery    . Latissimus flap to breast Right 03/12/2013    Procedure: RIGHT BREAST LATISSIMUS FLAP WITH EXPANDER PLACEMENT;  Surgeon: Theodoro Kos, DO;  Location: [place];  Service: Plastics;  Laterality: Right;  . Cesarean section  07/15/2001; 03/18/2004; 11/21/2008  . Tubal ligation  11/21/2008  . Portacath placement Left 09/12/2009  . Modified radical mastectomy Right 03/11/2010  . Port-a-cath removal Left 12/01/2010  . Removal of tissue expander and placement of implant Right 08/23/2013    Procedure: REMOVAL RIGHT TISSUE  EXPANDER AND PLACEMENT OF IMPLANT TO RIGHT BREAST ;  Surgeon: Theodoro Kos, DO;  Location: [place];  Service: Plastics;  Laterality: Right;  . Breast reduction surgery Left 08/23/2013    Procedure: LEFT BREAST REDUCTION  ;  Surgeon: Theodoro Kos, DO;  Location: [place];  Service: Plastics;  Laterality: Left;  . Liposuction Bilateral 08/23/2013    Procedure: LIPOSUCTION;  Surgeon: Theodoro Kos, DO;  Location: [place];  Service: Plastics;  Laterality: Bilateral;    There were no vitals filed for this visit.  Visit Diagnosis:  Lymphedema of upper extremity following lymphadenectomy      Subjective Assessment - 04/22/15 1130    Subjective I don't have my bandages, they are in my old car at my parents in [place] and they are all stretched out anyways. My nighttime garment makes my fingers numb so I don't wear it, I feel like it's always done that. I have been feeling so blah, I don't know how to get outside my head! And my uncle might pass away tonight so Im feeling stressed about that. I've tried some support groups online, but I haven't gone to a support group consistently.    Currently in Pain? Yes   Pain Score 5    Pain Location Shoulder   Pain Orientation Right   Pain Descriptors /  Indicators Aching;Constant   Pain Onset More than a month ago   Aggravating Factors  I don't know, it's just always there   Pain Relieving Factors dry needling, pain meds                         [facility] Adult PT Treatment/Exercise - 04/22/15 0001    Manual Therapy   Manual Lymphatic Drainage (MLD) In Supine: Short neck, Lt axilla nodes and upper quadrant and Rt inguinal nodes, anterior inter-axillary and Rt axillo-inguinal anastomosis and Rt UE from dorsal hand to lateral shoulder.    Compression Bandaging Issued new bandages.  Applied biotone to arm Bandaging with med TG soft, Elastomull to all fingers; Artiflex x 1; 1-6 cm. bandage on  hand, 1-10, 3-12 cm. short stretch bandages wrist to shoulder. with one layer in herringbone pattern with last 12 cm at upper arm                 PT Education - 04/22/15 1217    Education provided Yes   Education Details Spoke with pt about importance of joining a support group and getting into some kind of routine for exercise or something to help her be the best her for herself and her boys. And also reviewed with pt how importance of being active could also help her body handle the lymphedema better as well.   [place](s) Educated Patient   Methods Explanation   Comprehension Verbalized understanding;Need further instruction  Pt will continue to need motivation for this...          PT Short Term Goals - 04/03/15 1420    PT SHORT TERM GOAL #1   Title Patient will report a 25% reduction in pain with usual daily activities and home chores.   Baseline fluctuates from 4/10 to 8/10, 6/10 today   Time 6   Period Weeks   Status Partially Met   PT SHORT TERM GOAL #2   Title Patient will be able to resume a basic home ex program with minimal increase in pain    Baseline pain tends to fluctuate due to R upper trap compensatory spasms   Time 6   Period Weeks   Status Partially Met   PT SHORT TERM GOAL #3   Title Right shoulder flexion improved to  115 degrees needed for reaching shoulder height shelves.   Baseline Right shoulder flex 114, abd 110   Time 6   Period Weeks   Status Partially Met   PT SHORT TERM GOAL #4   Title Berg improved  10 points form baseline   Baseline 49/56   Time 6   Period Weeks   Status Partially Met         Short Term Clinic Goals - 09/03/14 1217    CC Short Term Goal  #1   Title short term goals= long term goals          PT Long Term Goals - 04/03/15 1422    PT LONG TERM GOAL #1   Title Independent with progressive HEP for further ROM, strengthening of Right UE.   Time 12   Period Weeks   Status Achieved   PT LONG TERM GOAL #2    Title Right shoulder flexion and abduction AROM improved to 125 degrees needed for reaching eye level shelves in the kitchen.     Baseline AROM flex 114, abd 110   Time 12   Period Weeks   Status Partially  Met   PT LONG TERM GOAL #3   Title Right UE strength grossly 4-/5 needed for basic cooking/cleaning for her 3 children.     Baseline Right UE 4/5 to 4+/5   Time 12   Period Weeks   Status Achieved   PT LONG TERM GOAL #4   Title FOTO improved 20 ppoints for indication of overall improved function   Baseline 59% today just less than 20 from 76%   Time 12   Period Weeks   Status Partially Met   PT LONG TERM GOAL #5   Title Patient will report overall improvement in pain level 3/10 level or better  and improve  function at 50%.   Baseline 6/10 but sporadic 4/10 to 8/10  Pt able to perform personal hygiene with 50% ease   Time 12   Period Weeks   Status Partially Met   PT LONG TERM GOAL #6   Title Pt will be able to manage pain at night in order to sleep 4 or more hours of restorative sleep. Pt will verbalize 3 pain management strategies for sleep/rest   Time 12   Period Weeks   Status Achieved   PT LONG TERM GOAL #7   Title Pt will incorporate walking program in to daily life to encourage aerobic exercise for health  Pt to work up to 1/4 a mile   Baseline Pt knowledgeable about walking program.  but busyness of life prevents completion   Time 12   Period Weeks   Status Partially [facility] Goals - 04/18/15 1248    CC Long Term Goal  #1   Title Right arm at 15 cm above the olecranon will decrease by 1 cm   (target 05/29/2015)   Baseline 50    Time 4   Period Weeks   Status New   CC Long Term Goal  #2   Title Pt will verbalize she knows what to do to manage her lymphedema at home and a plan for how she will do that. ( 05/18/2015)   Time 4   Period Weeks   Status New            Plan - 04/22/15 1216    Clinical Impression Statement Pt is  overall very frustrated with where she is at in her life right now regarding her lymphedema and reports not having any motiviation to get into a routine for exercise and is tired of dealing with her lymphedema. See education section. Had to issue new bandages today as pt reports she thinks hers are in [place].    Pt will benefit from skilled therapeutic intervention in order to improve on the following deficits Increased edema   Rehab Potential Good   Clinical Impairments Affecting Rehab Potential multiple medical and chronic pain issues   PT Frequency 3x / week   PT Duration 4 weeks   PT Treatment/Interventions ADLs/Self Care Home Management;Compression bandaging;Patient/family education;Manual techniques;Manual lymph drainage   PT Next Visit Plan manual lymph drainage and compression bandaging.  Asked pt to bring her nighttime garment so it could be evaluated for fit.    Recommended Other Services Spoke with pt about joining a support group, She will need further encouragement to do this.    Consulted and Agree with Plan of Care Patient        Problem List Patient Active Problem List   Diagnosis Date Noted  . Type 2  diabetes mellitus without complication ([place]) 98/11/9994  . Lower extremity edema 10/01/2014  . Breast cancer, right breast ([place]) 05/24/2014  . Myalgia and myositis 05/17/2014  . Neoplasm related pain 03/12/2014  . CN (constipation) 11/26/2013  . H/O reduction mammoplasty 10/30/2013  . Seizure ([place]) 10/06/2013  . Status post bilateral breast implants 08/31/2013  . Acquired absence of breast and absent nipple 08/23/2013  . S/P breast reconstruction, right 08/23/2013  . AC joint arthropathy 07/02/2013  . Routine adult health maintenance 05/29/2013  . Hypokalemia 05/21/2013  . Chronic pain 04/19/2013  . Elevated blood sugar 03/09/2013  . Wheezing 02/22/2013  . Acute bronchitis 02/22/2013  . Lymphedema 05/24/2011  . RHINOSINUSITIS, CHRONIC 04/08/2010  . Obesity  08/04/2006  . DEPRESSIVE DISORDER, NOS 08/04/2006  . RHINITIS, ALLERGIC 08/04/2006    Otelia Limes, PTA 04/22/2015, 12:28 PM  [place], [place], [address] Phone: [phone]   Fax:  [phone]  Name: Karen Hopkins MRN: [id number] Date of Birth: [birth year]

## 2015-04-25 ENCOUNTER — Ambulatory Visit: Payer: Medicare Other | Admitting: Physical Therapy

## 2015-04-25 DIAGNOSIS — I890 Lymphedema, not elsewhere classified: Principal | ICD-10-CM

## 2015-04-25 DIAGNOSIS — E8989 Other postprocedural endocrine and metabolic complications and disorders: Secondary | ICD-10-CM

## 2015-04-25 NOTE — Therapy (Signed)
[place], [place], [address] Phone: [phone]   Fax:  [phone]  Physical Therapy Treatment  Patient Details  Name: Karen Hopkins MRN: [id number] Date of Birth: April 24, 1976 Referring Provider: Jana Hakim   Encounter Date: 04/25/2015      PT End of Session - 04/25/15 1319    Visit Number 3   Number of Visits 13  needs KX   Date for PT Re-Evaluation 05/18/15   PT Start Time 0940   PT Stop Time 1018   PT Time Calculation (min) 38 min   Activity Tolerance Patient tolerated treatment well   Behavior During Therapy [facility] for tasks assessed/performed      Past Medical History  Diagnosis Date  . Seasonal allergies   . Lymphedema of arm     right; no BP or puncture to right arm  . Depression   . Anxiety   . Sinus headache   . Hypertension     under control with med., has been on med. x 2 yr.  . History of breast cancer 2011    right  . History of chemotherapy 2011  . History of radiation therapy 2011  . Cancer [facility])     Past Surgical History  Procedure Laterality Date  . Supra-umbilical hernia  8099  . Nasal septum surgery    . Latissimus flap to breast Right 03/12/2013    Procedure: RIGHT BREAST LATISSIMUS FLAP WITH EXPANDER PLACEMENT;  Surgeon: Theodoro Kos, DO;  Location: [place];  Service: Plastics;  Laterality: Right;  . Cesarean section  07/15/2001; 03/18/2004; 11/21/2008  . Tubal ligation  11/21/2008  . Portacath placement Left 09/12/2009  . Modified radical mastectomy Right 03/11/2010  . Port-a-cath removal Left 12/01/2010  . Removal of tissue expander and placement of implant Right 08/23/2013    Procedure: REMOVAL RIGHT TISSUE EXPANDER AND PLACEMENT OF IMPLANT TO RIGHT BREAST ;  Surgeon: Theodoro Kos, DO;  Location: [place];  Service: Plastics;  Laterality: Right;  . Breast reduction surgery Left 08/23/2013    Procedure: LEFT BREAST REDUCTION  ;  Surgeon: Theodoro Kos,  DO;  Location: [place];  Service: Plastics;  Laterality: Left;  . Liposuction Bilateral 08/23/2013    Procedure: LIPOSUCTION;  Surgeon: Theodoro Kos, DO;  Location: [place];  Service: Plastics;  Laterality: Bilateral;    There were no vitals filed for this visit.  Visit Diagnosis:  Lymphedema of upper extremity following lymphadenectomy      Subjective Assessment - 04/25/15 0945    Subjective pt brought in her nighttime garment today.  She says she took her bandages off last night and put nighttime garment on about 3 hours. She did not have time to put her sleeve on this morning.   "I can tell it went down"    Currently in Pain? --  did not assess today, pt did not  complain                          [facility] Adult PT Treatment/Exercise - 04/25/15 0001    Self-Care   Other Self-Care Comments  pt brought in her [facility] and was able to apply it. Her fingers went numb, but were relieved by loosening the velocro straps on hand and allowing her to move her fingers and wrist    Lumbar Exercises: Aerobic   Tread Mill 10 minutes today, 7 min at 2.3 mph , 3  min at 2.o mph   Hr 128, O2 sat 98, rate of perceived exertion 5   UBE (Upper Arm Bike) rebounder for 30 seconds with emaphasis on calf activity    Manual Therapy   Compression Bandaging .  Applied biotone to arm Bandaging with med TG soft, Elastomull to all fingers; Artiflex x 1; 1-6 cm. bandage on hand, 1-10, 3-12 cm. short stretch bandages wrist to shoulder. with one layer in herringbone pattern with last 12 cm at upper arm  cut and fit golds gym band to left upper arm to help hold bandages in place                 PT Education - 04/25/15 1319    Education provided Yes   Education Details OK to loosen hand piece of Reid Sleeve to relieve finger numbness    Farve(s) Educated Patient   Methods Explanation;Demonstration   Comprehension Verbalized understanding          PT  Short Term Goals - 04/03/15 1420    PT SHORT TERM GOAL #1   Title Patient will report a 25% reduction in pain with usual daily activities and home chores.   Baseline fluctuates from 4/10 to 8/10, 6/10 today   Time 6   Period Weeks   Status Partially Met   PT SHORT TERM GOAL #2   Title Patient will be able to resume a basic home ex program with minimal increase in pain    Baseline pain tends to fluctuate due to R upper trap compensatory spasms   Time 6   Period Weeks   Status Partially Met   PT SHORT TERM GOAL #3   Title Right shoulder flexion improved to  115 degrees needed for reaching shoulder height shelves.   Baseline Right shoulder flex 114, abd 110   Time 6   Period Weeks   Status Partially Met   PT SHORT TERM GOAL #4   Title Berg improved  10 points form baseline   Baseline 49/56   Time 6   Period Weeks   Status Partially Met         Short Term Clinic Goals - 09/03/14 1217    CC Short Term Goal  #1   Title short term goals= long term goals          PT Long Term Goals - 04/03/15 1422    PT LONG TERM GOAL #1   Title Independent with progressive HEP for further ROM, strengthening of Right UE.   Time 12   Period Weeks   Status Achieved   PT LONG TERM GOAL #2   Title Right shoulder flexion and abduction AROM improved to 125 degrees needed for reaching eye level shelves in the kitchen.     Baseline AROM flex 114, abd 110   Time 12   Period Weeks   Status Partially Met   PT LONG TERM GOAL #3   Title Right UE strength grossly 4-/5 needed for basic cooking/cleaning for her 3 children.     Baseline Right UE 4/5 to 4+/5   Time 12   Period Weeks   Status Achieved   PT LONG TERM GOAL #4   Title FOTO improved 20 ppoints for indication of overall improved function   Baseline 59% today just less than 20 from 76%   Time 12   Period Weeks   Status Partially Met   PT LONG TERM GOAL #5   Title Patient will report overall improvement in  pain level 3/10 level or better   and improve  function at 50%.   Baseline 6/10 but sporadic 4/10 to 8/10  Pt able to perform personal hygiene with 50% ease   Time 12   Period Weeks   Status Partially Met   PT LONG TERM GOAL #6   Title Pt will be able to manage pain at night in order to sleep 4 or more hours of restorative sleep. Pt will verbalize 3 pain management strategies for sleep/rest   Time 12   Period Weeks   Status Achieved   PT LONG TERM GOAL #7   Title Pt will incorporate walking program in to daily life to encourage aerobic exercise for health  Pt to work up to 1/4 a mile   Baseline Pt knowledgeable about walking program.  but busyness of life prevents completion   Time 12   Period Weeks   Status Partially [facility] Goals - 04/18/15 1248    CC Long Term Goal  #1   Title Right arm at 15 cm above the olecranon will decrease by 1 cm   (target 05/29/2015)   Baseline 50    Time 4   Period Weeks   Status New   CC Long Term Goal  #2   Title Pt will verbalize she knows what to do to manage her lymphedema at home and a plan for how she will do that. ( 05/18/2015)   Time 4   Period Weeks   Status New            Plan - 04/25/15 1322    Clinical Impression Statement Upgraded program to include treadmill and rebounder and pt happy with that.  Her [employer] Pro will be ok with suggestions today. She was happy with addidtion of golds gym band to hold bandages up and feels like she should be able to get back into her sleee soon.    Clinical Impairments Affecting Rehab Potential multiple medical and chronic pain issues   PT Next Visit Plan continue treadmill, rebounder and compression bandaging.  Assess golds gym band and  progress to daytime garment   Consulted and Agree with Plan of Care Patient        Problem List Patient Active Problem List   Diagnosis Date Noted  . Type 2 diabetes mellitus without complication ([place]) 51/70/0174  . Lower extremity edema 10/01/2014  . Breast  cancer, right breast ([place]) 05/24/2014  . Myalgia and myositis 05/17/2014  . Neoplasm related pain 03/12/2014  . CN (constipation) 11/26/2013  . H/O reduction mammoplasty 10/30/2013  . Seizure ([place]) 10/06/2013  . Status post bilateral breast implants 08/31/2013  . Acquired absence of breast and absent nipple 08/23/2013  . S/P breast reconstruction, right 08/23/2013  . AC joint arthropathy 07/02/2013  . Routine adult health maintenance 05/29/2013  . Hypokalemia 05/21/2013  . Chronic pain 04/19/2013  . Elevated blood sugar 03/09/2013  . Wheezing 02/22/2013  . Acute bronchitis 02/22/2013  . Lymphedema 05/24/2011  . RHINOSINUSITIS, CHRONIC 04/08/2010  . Obesity 08/04/2006  . DEPRESSIVE DISORDER, NOS 08/04/2006  . RHINITIS, ALLERGIC 08/04/2006   Donato Heinz. Owens Shark, PT  04/25/2015, 1:28 PM  [place], [place], [address] Phone: [phone]   Fax:  [phone]  Name: Karen Hopkins MRN: [id number] Date of Birth: [birth year]

## 2015-04-28 ENCOUNTER — Ambulatory Visit: Payer: Medicare Other

## 2015-04-29 ENCOUNTER — Other Ambulatory Visit: Payer: Self-pay | Admitting: Physical Medicine & Rehabilitation

## 2015-04-29 ENCOUNTER — Encounter: Payer: Medicare Other | Attending: Registered Nurse

## 2015-04-29 ENCOUNTER — Ambulatory Visit (HOSPITAL_BASED_OUTPATIENT_CLINIC_OR_DEPARTMENT_OTHER): Payer: Medicare Other | Admitting: Physical Medicine & Rehabilitation

## 2015-04-29 ENCOUNTER — Ambulatory Visit: Payer: Medicare Other | Admitting: Physical Therapy

## 2015-04-29 ENCOUNTER — Encounter: Payer: Self-pay | Admitting: Physical Medicine & Rehabilitation

## 2015-04-29 VITALS — BP 131/76 | HR 96 | Resp 12

## 2015-04-29 DIAGNOSIS — E8989 Other postprocedural endocrine and metabolic complications and disorders: Secondary | ICD-10-CM | POA: Diagnosis not present

## 2015-04-29 DIAGNOSIS — M546 Pain in thoracic spine: Secondary | ICD-10-CM | POA: Insufficient documentation

## 2015-04-29 DIAGNOSIS — M7918 Myalgia, other site: Secondary | ICD-10-CM | POA: Insufficient documentation

## 2015-04-29 DIAGNOSIS — G893 Neoplasm related pain (acute) (chronic): Secondary | ICD-10-CM

## 2015-04-29 DIAGNOSIS — M7501 Adhesive capsulitis of right shoulder: Secondary | ICD-10-CM | POA: Diagnosis not present

## 2015-04-29 DIAGNOSIS — I890 Lymphedema, not elsewhere classified: Secondary | ICD-10-CM | POA: Diagnosis not present

## 2015-04-29 DIAGNOSIS — M797 Fibromyalgia: Secondary | ICD-10-CM | POA: Diagnosis not present

## 2015-04-29 MED ORDER — OXYCODONE HCL 5 MG PO TABS: 5.0000 mg | ORAL_TABLET | Freq: Four times a day (QID) | ORAL | Status: DC | PRN

## 2015-04-29 NOTE — Therapy (Signed)
[place], [place], [address] Phone: [phone]   Fax:  [phone]  Physical Therapy Treatment  Patient Details  Name: Karen Hopkins MRN: [id number] Date of Birth: 18-May-1976 Referring Provider: Jana Hakim   Encounter Date: 04/29/2015      PT End of Session - 04/29/15 1507    Visit Number 4   Number of Visits 13   Date for PT Re-Evaluation 05/18/15   PT Start Time 1435   PT Stop Time 1500   PT Time Calculation (min) 25 min      Past Medical History  Diagnosis Date  . Seasonal allergies   . Lymphedema of arm     right; no BP or puncture to right arm  . Depression   . Anxiety   . Sinus headache   . Hypertension     under control with med., has been on med. x 2 yr.  . History of breast cancer 2011    right  . History of chemotherapy 2011  . History of radiation therapy 2011  . Cancer [facility])     Past Surgical History  Procedure Laterality Date  . Supra-umbilical hernia  5465  . Nasal septum surgery    . Latissimus flap to breast Right 03/12/2013    Procedure: RIGHT BREAST LATISSIMUS FLAP WITH EXPANDER PLACEMENT;  Surgeon: Theodoro Kos, DO;  Location: [place];  Service: Plastics;  Laterality: Right;  . Cesarean section  07/15/2001; 03/18/2004; 11/21/2008  . Tubal ligation  11/21/2008  . Portacath placement Left 09/12/2009  . Modified radical mastectomy Right 03/11/2010  . Port-a-cath removal Left 12/01/2010  . Removal of tissue expander and placement of implant Right 08/23/2013    Procedure: REMOVAL RIGHT TISSUE EXPANDER AND PLACEMENT OF IMPLANT TO RIGHT BREAST ;  Surgeon: Theodoro Kos, DO;  Location: [place];  Service: Plastics;  Laterality: Right;  . Breast reduction surgery Left 08/23/2013    Procedure: LEFT BREAST REDUCTION  ;  Surgeon: Theodoro Kos, DO;  Location: [place];  Service: Plastics;  Laterality: Left;  . Liposuction Bilateral 08/23/2013   Procedure: LIPOSUCTION;  Surgeon: Theodoro Kos, DO;  Location: [place];  Service: Plastics;  Laterality: Bilateral;    There were no vitals filed for this visit.  Visit Diagnosis:  Lymphedema of upper extremity following lymphadenectomy      Subjective Assessment - 04/29/15 1437    Subjective Pt emotional today. She had another death in the family. This one was very unexpected.   She just wants to be wrapped only today. She does feel that her arm is doing better    Pertinent History Right breast cancer with mastectomy 2011; reconstruction; left breast reduction; h/o right UE lymphedema and right upper quadrant pain, treated in this clinic several times before.  Recent scare with left breeast, but but ultrasound and mammogram were negative.   Currently in Pain? No/denies  did not assess                         [facility] Adult PT Treatment/Exercise - 04/29/15 0001    Manual Therapy   Compression Bandaging .  Applied biotone to arm Bandaging with med TG soft, Elastomull to all fingers; Artiflex x 1; 1-6 cm. bandage on hand, 1-10, 3-12 cm. short stretch bandages wrist to shoulder. with one layer in herringbone pattern with last 12 cm at upper arm .  PT Short Term Goals - 04/03/15 1420    PT SHORT TERM GOAL #1   Title Patient will report a 25% reduction in pain with usual daily activities and home chores.   Baseline fluctuates from 4/10 to 8/10, 6/10 today   Time 6   Period Weeks   Status Partially Met   PT SHORT TERM GOAL #2   Title Patient will be able to resume a basic home ex program with minimal increase in pain    Baseline pain tends to fluctuate due to R upper trap compensatory spasms   Time 6   Period Weeks   Status Partially Met   PT SHORT TERM GOAL #3   Title Right shoulder flexion improved to  115 degrees needed for reaching shoulder height shelves.   Baseline Right shoulder flex 114, abd 110   Time 6   Period  Weeks   Status Partially Met   PT SHORT TERM GOAL #4   Title Berg improved  10 points form baseline   Baseline 49/56   Time 6   Period Weeks   Status Partially Met         Short Term Clinic Goals - 09/03/14 1217    CC Short Term Goal  #1   Title short term goals= long term goals          PT Long Term Goals - 04/03/15 1422    PT LONG TERM GOAL #1   Title Independent with progressive HEP for further ROM, strengthening of Right UE.   Time 12   Period Weeks   Status Achieved   PT LONG TERM GOAL #2   Title Right shoulder flexion and abduction AROM improved to 125 degrees needed for reaching eye level shelves in the kitchen.     Baseline AROM flex 114, abd 110   Time 12   Period Weeks   Status Partially Met   PT LONG TERM GOAL #3   Title Right UE strength grossly 4-/5 needed for basic cooking/cleaning for her 3 children.     Baseline Right UE 4/5 to 4+/5   Time 12   Period Weeks   Status Achieved   PT LONG TERM GOAL #4   Title FOTO improved 20 ppoints for indication of overall improved function   Baseline 59% today just less than 20 from 76%   Time 12   Period Weeks   Status Partially Met   PT LONG TERM GOAL #5   Title Patient will report overall improvement in pain level 3/10 level or better  and improve  function at 50%.   Baseline 6/10 but sporadic 4/10 to 8/10  Pt able to perform personal hygiene with 50% ease   Time 12   Period Weeks   Status Partially Met   PT LONG TERM GOAL #6   Title Pt will be able to manage pain at night in order to sleep 4 or more hours of restorative sleep. Pt will verbalize 3 pain management strategies for sleep/rest   Time 12   Period Weeks   Status Achieved   PT LONG TERM GOAL #7   Title Pt will incorporate walking program in to daily life to encourage aerobic exercise for health  Pt to work up to 1/4 a mile   Baseline Pt knowledgeable about walking program.  but busyness of life prevents completion   Time 12   Period Weeks    Status Partially Met  [facility] Goals - 04/18/15 1248    CC Long Term Goal  #1   Title Right arm at 15 cm above the olecranon will decrease by 1 cm   (target 05/29/2015)   Baseline 50    Time 4   Period Weeks   Status New   CC Long Term Goal  #2   Title Pt will verbalize she knows what to do to manage her lymphedema at home and a plan for how she will do that. ( 05/18/2015)   Time 4   Period Weeks   Status New            Plan - 04/29/15 1507    Clinical Impression Statement Pt feels that, overall, her arm is doing better and that she should be ready to get into her sleeve by next week She feels that the [employer] band is beneficial  She will try to continue self care as best she can this week, but realizes that it will  be difficult due to emotions of death in her family    PT Next Visit Plan Remeasure arm continue treadmill, rebounder and compression bandaging.  progress to daytime garment        Problem List Patient Active Problem List   Diagnosis Date Noted  . Myofacial muscle pain 04/29/2015  . Adhesive capsulitis of right shoulder 04/29/2015  . Type 2 diabetes mellitus without complication ([place]) 24/81/8590  . Lower extremity edema 10/01/2014  . Breast cancer, right breast ([place]) 05/24/2014  . Myalgia and myositis 05/17/2014  . Neoplasm related pain 03/12/2014  . CN (constipation) 11/26/2013  . H/O reduction mammoplasty 10/30/2013  . Seizure ([place]) 10/06/2013  . Status post bilateral breast implants 08/31/2013  . Acquired absence of breast and absent nipple 08/23/2013  . S/P breast reconstruction, right 08/23/2013  . AC joint arthropathy 07/02/2013  . Routine adult health maintenance 05/29/2013  . Hypokalemia 05/21/2013  . Chronic pain 04/19/2013  . Elevated blood sugar 03/09/2013  . Wheezing 02/22/2013  . Acute bronchitis 02/22/2013  . Lymphedema 05/24/2011  . RHINOSINUSITIS, CHRONIC 04/08/2010  . Obesity 08/04/2006  . DEPRESSIVE  DISORDER, NOS 08/04/2006  . RHINITIS, ALLERGIC 08/04/2006   Donato Heinz. Owens Shark, PT  04/29/2015, 3:10 PM  [place], [place], [address] Phone: [phone]   Fax:  [phone]  Name: Karen Hopkins MRN: [id number] Date of Birth: 10-31-1975

## 2015-04-29 NOTE — Patient Instructions (Signed)
Trigger Point Injection Trigger points are areas where you have muscle pain. A trigger point injection is a shot given in the trigger point to relieve that pain. A trigger point might feel like a knot in your muscle. It hurts to press on a trigger point. Sometimes the pain spreads out (radiates) to other parts of the body. For example, pressing on a trigger point in your shoulder might cause pain in your arm or neck. You might have one trigger point. Or, you might have more than one. People often have trigger points in their upper back and lower back. They also occur often in the neck and shoulders. Pain from a trigger point lasts for a long time. It can make it hard to keep moving. You might not be able to do the exercise or physical therapy that could help you deal with the pain. A trigger point injection may help. It does not work for everyone. But, it may relieve your pain for a few days or a few months. A trigger point injection does not cure long-lasting (chronic) pain. LET YOUR CAREGIVER KNOW ABOUT:  Any allergies (especially to latex, lidocaine, or steroids).  Blood-thinning medicines that you take. These drugs can lead to bleeding or bruising after an injection. They include:  Aspirin.  Ibuprofen.  Clopidogrel.  Warfarin.  Other medicines you take. This includes all vitamins, herbs, eyedrops, over-the-counter medicines, and creams.  Use of steroids.  Recent infections.  Past problems with numbing medicines.  Bleeding problems.  Surgeries you have had.  Other health problems. RISKS AND COMPLICATIONS A trigger point injection is a safe treatment. However, problems may develop, such as:  Minor side effects usually go away in 1 to 2 days. These may include:  Soreness.  Bruising.  Stiffness.  More serious problems are rare. But, they may include:  Bleeding under the skin (hematoma).  Skin infection.  Breaking off of the needle under your skin.  Lung  puncture.  The trigger point injection may not work for you. BEFORE THE PROCEDURE You may need to stop taking any medicine that thins your blood. This is to prevent bleeding and bruising. Usually these medicines are stopped several days before the injection. No other preparation is needed. PROCEDURE  A trigger point injection can be given in your caregiver's office or in a clinic. Each injection takes 2 minutes or less.  Your caregiver will feel for trigger points. The caregiver may use a marker to circle the area for the injection.  The skin over the trigger point will be washed with a germ-killing (antiseptic) solution.  The caregiver pinches the spot for the injection.  Then, a very thin needle is used for the shot. You may feel pain or a twitching feeling when the needle enters the trigger point.  A numbing solution may be injected into the trigger point. Sometimes a drug to keep down swelling, redness, and warmth (inflammation) is also injected.  Your caregiver moves the needle around the trigger zone until the tightness and twitching goes away.  After the injection, your caregiver may put gentle pressure over the injection site.  Then it is covered with a bandage. AFTER THE PROCEDURE  You can go right home after the injection.  The bandage can be taken off after a few hours.  You may feel sore and stiff for 1 to 2 days.  Go back to your regular activities slowly. Your caregiver may ask you to stretch your muscles. Do not do anything that takes   extra energy for a few days.  Follow your caregiver's instructions to manage and treat other pain.   This information is not intended to replace advice given to you by your health care provider. Make sure you discuss any questions you have with your health care provider.   Document Released: 05/13/2011 Document Revised: 09/18/2012 Document Reviewed: 05/13/2011 Elsevier Interactive Patient Education 2016 [employer]  

## 2015-04-29 NOTE — Progress Notes (Signed)
Subjective:    Patient ID: Karen Hopkins, female    DOB: 01-31-1976, 39 y.o.   MRN: [id number]  HPI 39 year old female with history of right breast carcinoma, Status post modified radical mastectomy with postoperative radiation therapy In 2014. She had subsequent development of chronic right shoulder and right neck pain. She underwent reconstruction with tissue expanders as well as a breast reduction surgery in 2015 She is developed lymphedema in the right upper extremity. This was rather well controlled after wrapping and other lymphedema reduction strategies through PT and OT. This has returned and is now restarting PT for this reason. She is also benefited from trigger point injections to the right trapezius infraspinatus and levator scapula. She's also undergone dry needling through physical therapy. Her last treatment was a couple weeks ago with the dry needling. The current PT is focusing on edema reduction  Overall her pain has improved she think she can reduce her oxycodone from 10 mg to 5 mg. She would like to try a 4 times a day dosing rather than 3 times a day given the duration of effect for the medication. Pain Inventory Average Pain 6 Pain Right Now 8 My pain is sharp, burning, dull, stabbing, tingling and aching  In the last 24 hours, has pain interfered with the following? General activity 4 Relation with others 2 Enjoyment of life 4 What TIME of day is your pain at its worst? all Sleep (in general) Poor  Pain is worse with: inactivity and some activites Pain improves with: rest, therapy/exercise and medication Relief from Meds: 4  Mobility walk without assistance how many minutes can you walk? 10 minutes ability to climb steps?  yes do you drive?  yes  Function disabled: date disabled na  Neuro/Psych numbness tingling trouble walking spasms confusion depression anxiety  Prior Studies Any changes since last visit?  no  Physicians involved in your  care Any changes since last visit?  no   Family History  Problem Relation Age of Onset  . Hypertension Mother   . Diabetes type II Father   . Prostate cancer Father    Social History   Social History  . Marital Status: Single    Spouse Name: N/A  . Number of Children: N/A  . Years of Education: N/A   Social History Main Topics  . Smoking status: Former Smoker -- 0.25 packs/day for 5 years    Quit date: 04/26/2010  . Smokeless tobacco: Never Used  . Alcohol Use: No  . Drug Use: No  . Sexual Activity: Not Currently    Birth Control/ Protection: Surgical   Other Topics Concern  . None   Social History Narrative   Past Surgical History  Procedure Laterality Date  . Supra-umbilical hernia  123456  . Nasal septum surgery    . Latissimus flap to breast Right 03/12/2013    Procedure: RIGHT BREAST LATISSIMUS FLAP WITH EXPANDER PLACEMENT;  Surgeon: Theodoro Kos, DO;  Location: [place];  Service: Plastics;  Laterality: Right;  . Cesarean section  07/15/2001; 03/18/2004; 11/21/2008  . Tubal ligation  11/21/2008  . Portacath placement Left 09/12/2009  . Modified radical mastectomy Right 03/11/2010  . Port-a-cath removal Left 12/01/2010  . Removal of tissue expander and placement of implant Right 08/23/2013    Procedure: REMOVAL RIGHT TISSUE EXPANDER AND PLACEMENT OF IMPLANT TO RIGHT BREAST ;  Surgeon: Theodoro Kos, DO;  Location: [place];  Service: Plastics;  Laterality: Right;  . Breast reduction surgery Left  08/23/2013    Procedure: LEFT BREAST REDUCTION  ;  Surgeon: Theodoro Kos, DO;  Location: [place];  Service: Plastics;  Laterality: Left;  . Liposuction Bilateral 08/23/2013    Procedure: LIPOSUCTION;  Surgeon: Theodoro Kos, DO;  Location: [place];  Service: Plastics;  Laterality: Bilateral;   Past Medical History  Diagnosis Date  . Seasonal allergies   . Lymphedema of arm     right; no BP or puncture to right arm  . Depression    . Anxiety   . Sinus headache   . Hypertension     under control with med., has been on med. x 2 yr.  . History of breast cancer 2011    right  . History of chemotherapy 2011  . History of radiation therapy 2011  . Cancer (HCC)    BP 131/76 mmHg  Pulse 96  Resp 12  SpO2 99%  LMP  (Within Months)  Opioid Risk Score:   Fall Risk Score:  `1  Depression screen PHQ 2/9  Depression screen [facility] 2/9 03/04/2015 01/20/2015 01/07/2015 11/21/2014 10/07/2014 10/01/2014 09/13/2014  Decreased Interest 0 0 0 0 0 0 0  Down, Depressed, Hopeless 0 0 0 0 0 0 0  PHQ - 2 Score 0 0 0 0 0 0 0  Altered sleeping - - - - - - -  Tired, decreased energy - - - - - - -  Change in appetite - - - - - - -  Feeling bad or failure about yourself  - - - - - - -  Trouble concentrating - - - - - - -  Moving slowly or fidgety/restless - - - - - - -  Suicidal thoughts - - - - - - -  PHQ-9 Score - - - - - - -     Review of Systems  Musculoskeletal: Positive for gait problem.       Spasms  Neurological: Positive for numbness.       Tingling  Psychiatric/Behavioral: Positive for confusion and dysphoric mood. The patient is nervous/anxious.   All other systems reviewed and are negative.      Objective:   Physical Exam  Constitutional: She is oriented to [place], place, and time. She appears well-developed and well-nourished.  HENT:  Head: Normocephalic and atraumatic.  Eyes: Conjunctivae are normal. Pupils are equal, round, and reactive to light.  Neck:  Pain with left lateral bending this pain is on the right side of the neck  Neurological: She is alert and oriented to [place], place, and time.  Psychiatric: She has a normal mood and affect.  Nursing note and vitals reviewed.   Motor strength is 5/5 bilateral biceps and triceps grip 3 minus right shoulder, deltoid abduction, 5/5 left shoulder abduction. Limitation in right shoulder range of motion with external rotation and abduction. Lymphedema noted in the  right arm, no lymphedema noted in the right hand or right forearm. Sensation intact to light touch in the right upper extremity. She has tenderness palpation in the posterior scalenes  On the right side as well as trapezius levator and infraspinatus      Assessment & Plan:  1. Neoplasm Related pain right side neck and shoulder area. She is currently experiencing some exacerbation in terms of the myofascial pain. Recommend repeat trigger point injection Will reduce oxycodone from 10 mg 3 times a day to 5 mg 4 times a day  Trigger Point Injection  Indication: Right parascapular and cervical  Myofascial pain not relieved by medication management and other conservative care.  Informed consent was obtained after describing risk and benefits of the procedure with the patient, this includes bleeding, bruising, infection and medication side effects.  The patient wishes to proceed and has given written consent.  The patient was placed in a seated position.  The Right trapezius right posterior scalene, right levator scapula, right infraspinatus area was marked and prepped with Betadine.  It was entered with a 25-gauge 1-1/2 inch needle and 1 mL of 1% lidocaine was injected into each of 4 trigger points, after negative draw back for blood.  The patient tolerated the procedure well.  Post procedure instructions were given.  2. Lymphedema right arm, agree with physical therapy wrapping and lymph drainage techniques

## 2015-05-05 ENCOUNTER — Ambulatory Visit: Payer: Medicare Other | Admitting: Physical Therapy

## 2015-05-05 ENCOUNTER — Encounter: Payer: Self-pay | Admitting: Physical Therapy

## 2015-05-05 DIAGNOSIS — E8989 Other postprocedural endocrine and metabolic complications and disorders: Secondary | ICD-10-CM | POA: Diagnosis not present

## 2015-05-05 DIAGNOSIS — I890 Lymphedema, not elsewhere classified: Principal | ICD-10-CM

## 2015-05-05 NOTE — Therapy (Signed)
[place], [place], [address] Phone: [phone]   Fax:  [phone]  Physical Therapy Treatment  Patient Details  Name: Karen Hopkins MRN: [id number] Date of Birth: June 06, 1976 Referring Provider: Jana Hakim   Encounter Date: 05/05/2015      PT End of Session - 05/05/15 1358    Visit Number 5   Number of Visits 13   Date for PT Re-Evaluation 05/18/15   Authorization Type Medicare KX   PT Start Time [id number]   PT Stop Time 1423   PT Time Calculation (min) 29 min   Activity Tolerance Patient tolerated treatment well   Behavior During Therapy [facility] for tasks assessed/performed      Past Medical History  Diagnosis Date  . Seasonal allergies   . Lymphedema of arm     right; no BP or puncture to right arm  . Depression   . Anxiety   . Sinus headache   . Hypertension     under control with med., has been on med. x 2 yr.  . History of breast cancer 2011    right  . History of chemotherapy 2011  . History of radiation therapy 2011  . Cancer [facility])     Past Surgical History  Procedure Laterality Date  . Supra-umbilical hernia  123456  . Nasal septum surgery    . Latissimus flap to breast Right 03/12/2013    Procedure: RIGHT BREAST LATISSIMUS FLAP WITH EXPANDER PLACEMENT;  Surgeon: Theodoro Kos, DO;  Location: [place];  Service: Plastics;  Laterality: Right;  . Cesarean section  07/15/2001; 03/18/2004; 11/21/2008  . Tubal ligation  11/21/2008  . Portacath placement Left 09/12/2009  . Modified radical mastectomy Right 03/11/2010  . Port-a-cath removal Left 12/01/2010  . Removal of tissue expander and placement of implant Right 08/23/2013    Procedure: REMOVAL RIGHT TISSUE EXPANDER AND PLACEMENT OF IMPLANT TO RIGHT BREAST ;  Surgeon: Theodoro Kos, DO;  Location: [place];  Service: Plastics;  Laterality: Right;  . Breast reduction surgery Left 08/23/2013    Procedure: LEFT BREAST REDUCTION  ;   Surgeon: Theodoro Kos, DO;  Location: [place];  Service: Plastics;  Laterality: Left;  . Liposuction Bilateral 08/23/2013    Procedure: LIPOSUCTION;  Surgeon: Theodoro Kos, DO;  Location: [place];  Service: Plastics;  Laterality: Bilateral;    There were no vitals filed for this visit.  Visit Diagnosis:  Lymphedema of upper extremity following lymphadenectomy      Subjective Assessment - 05/05/15 1356    Subjective Doing fine.  I stayed wrapped for 2 days. I am late becuase of getting my son and need to get out of here so can I just be wrapped?   Pertinent History Right breast cancer with mastectomy 2011; reconstruction; left breast reduction; h/o right UE lymphedema and right upper quadrant pain, treated in this clinic several times before.  Recent scare with left breeast, but but ultrasound and mammogram were negative.   Currently in Pain? No/denies           [facility] Adult PT Treatment/Exercise - 05/05/15 0001    Manual Therapy   Compression Bandaging .  Applied biotone to arm Bandaging with med TG soft, Elastomull to all fingers; Artiflex x 1; 1-6 cm. bandage on hand, 1-10, 3-12 cm. short stretch bandages wrist to shoulder. with one layer in herringbone pattern with last 12 cm at upper arm .  [facility] Goals - 04/18/15 1248    CC Long Term Goal  #1   Title Right arm at 15 cm above the olecranon will decrease by 1 cm   (target 05/29/2015)   Baseline 50    Time 4   Period Weeks   Status New   CC Long Term Goal  #2   Title Pt will verbalize she knows what to do to manage her lymphedema at home and a plan for how she will do that. ( 05/18/2015)   Time 4   Period Weeks   Status New            Plan - 05/05/15 1423    Clinical Impression Statement Pt is very pleased with her progress.  Compression bandaging is significantly reducing her swelling and she is pleased with that.  She will benefit from manual  lymph drainage but knows she needs to arrive on time so we had that conversation today and she verbalized understanding.   Pt will benefit from skilled therapeutic intervention in order to improve on the following deficits Increased edema   Rehab Potential Good   Clinical Impairments Affecting Rehab Potential multiple medical and chronic pain issues   PT Frequency 3x / week   PT Duration 4 weeks   PT Treatment/Interventions ADLs/Self Care Home Management;Compression bandaging;Patient/family education;Manual techniques;Manual lymph drainage   PT Next Visit Plan Remeasure arm, manual lymph drainage, compression bandaging.     Consulted and Agree with Plan of Care Patient        Problem List Patient Active Problem List   Diagnosis Date Noted  . Myofacial muscle pain 04/29/2015  . Adhesive capsulitis of right shoulder 04/29/2015  . Type 2 diabetes mellitus without complication ([place]) 123456  . Lower extremity edema 10/01/2014  . Breast cancer, right breast ([place]) 05/24/2014  . Myalgia and myositis 05/17/2014  . Neoplasm related pain 03/12/2014  . CN (constipation) 11/26/2013  . H/O reduction mammoplasty 10/30/2013  . Seizure ([place]) 10/06/2013  . Status post bilateral breast implants 08/31/2013  . Acquired absence of breast and absent nipple 08/23/2013  . S/P breast reconstruction, right 08/23/2013  . AC joint arthropathy 07/02/2013  . Routine adult health maintenance 05/29/2013  . Hypokalemia 05/21/2013  . Chronic pain 04/19/2013  . Elevated blood sugar 03/09/2013  . Wheezing 02/22/2013  . Acute bronchitis 02/22/2013  . Lymphedema 05/24/2011  . RHINOSINUSITIS, CHRONIC 04/08/2010  . Obesity 08/04/2006  . DEPRESSIVE DISORDER, NOS 08/04/2006  . RHINITIS, ALLERGIC 08/04/2006   Annia Friendly, PT 05/05/2015 2:27 PM  [place], [place], [address] Phone: [phone]   Fax:  [phone]  Name:  Karen Hopkins MRN: [id number] Date of Birth: 03-20-1976

## 2015-05-06 ENCOUNTER — Ambulatory Visit: Payer: Medicare Other

## 2015-05-06 ENCOUNTER — Ambulatory Visit: Payer: Medicare Other | Admitting: Nurse Practitioner

## 2015-05-07 ENCOUNTER — Ambulatory Visit: Payer: Medicare Other | Admitting: Physical Therapy

## 2015-05-07 ENCOUNTER — Other Ambulatory Visit: Payer: Self-pay | Admitting: *Deleted

## 2015-05-07 ENCOUNTER — Ambulatory Visit (HOSPITAL_BASED_OUTPATIENT_CLINIC_OR_DEPARTMENT_OTHER): Payer: Medicare Other | Admitting: Nurse Practitioner

## 2015-05-07 ENCOUNTER — Encounter: Payer: Self-pay | Admitting: Physical Therapy

## 2015-05-07 ENCOUNTER — Ambulatory Visit (HOSPITAL_BASED_OUTPATIENT_CLINIC_OR_DEPARTMENT_OTHER): Payer: Medicare Other

## 2015-05-07 ENCOUNTER — Telehealth: Payer: Self-pay | Admitting: Oncology

## 2015-05-07 VITALS — BP 136/90 | HR 20 | Temp 98.2°F | Resp 20

## 2015-05-07 VITALS — BP 132/87 | HR 96 | Temp 98.0°F | Resp 19 | Ht 64.0 in | Wt 267.9 lb

## 2015-05-07 DIAGNOSIS — Z5111 Encounter for antineoplastic chemotherapy: Secondary | ICD-10-CM | POA: Diagnosis present

## 2015-05-07 DIAGNOSIS — R51 Headache: Secondary | ICD-10-CM

## 2015-05-07 DIAGNOSIS — C50911 Malignant neoplasm of unspecified site of right female breast: Secondary | ICD-10-CM

## 2015-05-07 DIAGNOSIS — E8989 Other postprocedural endocrine and metabolic complications and disorders: Secondary | ICD-10-CM | POA: Diagnosis not present

## 2015-05-07 DIAGNOSIS — E876 Hypokalemia: Secondary | ICD-10-CM | POA: Diagnosis not present

## 2015-05-07 DIAGNOSIS — R519 Headache, unspecified: Secondary | ICD-10-CM

## 2015-05-07 DIAGNOSIS — R600 Localized edema: Secondary | ICD-10-CM

## 2015-05-07 DIAGNOSIS — I890 Lymphedema, not elsewhere classified: Principal | ICD-10-CM

## 2015-05-07 MED ORDER — ANASTROZOLE 1 MG PO TABS: 1.0000 mg | ORAL_TABLET | Freq: Every day | ORAL | Status: DC

## 2015-05-07 MED ORDER — FLUTICASONE PROPIONATE 50 MCG/ACT NA SUSP: 2.0000 | Freq: Every day | NASAL | Status: DC

## 2015-05-07 MED ORDER — GOSERELIN ACETATE 3.6 MG ~~LOC~~ IMPL
3.6000 mg | DRUG_IMPLANT | Freq: Once | SUBCUTANEOUS | Status: AC
  Administered 2015-05-07: 3.6 mg via SUBCUTANEOUS
  Filled 2015-05-07: qty 3.6

## 2015-05-07 MED ORDER — POTASSIUM CHLORIDE ER 10 MEQ PO CPCR: 20.0000 meq | ORAL_CAPSULE | Freq: Two times a day (BID) | ORAL | Status: DC

## 2015-05-07 MED ORDER — TRIAMCINOLONE ACETONIDE 55 MCG/ACT NA AERO: 2.0000 | INHALATION_SPRAY | Freq: Every day | NASAL | Status: DC

## 2015-05-07 NOTE — Therapy (Signed)
[place], [place], [address] Phone: [phone]   Fax:  [phone]  Physical Therapy Treatment  Patient Details  Name: Karen Hopkins MRN: [id number] Date of Birth: 04/28/1976 Referring Provider: Jana Hakim   Encounter Date: 05/07/2015      PT End of Session - 05/07/15 1425    Visit Number 6   Number of Visits 13   Date for PT Re-Evaluation 05/18/15   Authorization Type Medicare KX   PT Start Time 1335   PT Stop Time 1400   PT Time Calculation (min) 25 min   Activity Tolerance Patient tolerated treatment well   Behavior During Therapy [facility] for tasks assessed/performed      Past Medical History  Diagnosis Date  . Seasonal allergies   . Lymphedema of arm     right; no BP or puncture to right arm  . Depression   . Anxiety   . Sinus headache   . Hypertension     under control with med., has been on med. x 2 yr.  . History of breast cancer 2011    right  . History of chemotherapy 2011  . History of radiation therapy 2011  . Cancer [facility])     Past Surgical History  Procedure Laterality Date  . Supra-umbilical hernia  123456  . Nasal septum surgery    . Latissimus flap to breast Right 03/12/2013    Procedure: RIGHT BREAST LATISSIMUS FLAP WITH EXPANDER PLACEMENT;  Surgeon: Theodoro Kos, DO;  Location: [place];  Service: Plastics;  Laterality: Right;  . Cesarean section  07/15/2001; 03/18/2004; 11/21/2008  . Tubal ligation  11/21/2008  . Portacath placement Left 09/12/2009  . Modified radical mastectomy Right 03/11/2010  . Port-a-cath removal Left 12/01/2010  . Removal of tissue expander and placement of implant Right 08/23/2013    Procedure: REMOVAL RIGHT TISSUE EXPANDER AND PLACEMENT OF IMPLANT TO RIGHT BREAST ;  Surgeon: Theodoro Kos, DO;  Location: [place];  Service: Plastics;  Laterality: Right;  . Breast reduction surgery Left 08/23/2013    Procedure: LEFT BREAST REDUCTION  ;   Surgeon: Theodoro Kos, DO;  Location: [place];  Service: Plastics;  Laterality: Left;  . Liposuction Bilateral 08/23/2013    Procedure: LIPOSUCTION;  Surgeon: Theodoro Kos, DO;  Location: [place];  Service: Plastics;  Laterality: Bilateral;    There were no vitals filed for this visit.  Visit Diagnosis:  Lymphedema of upper extremity following lymphadenectomy      Subjective Assessment - 05/07/15 1341    Subjective My wraps did really well and stayed up.  My arm feels like it's gone down.  Pt was at the cancer center nfor an appointment today and running late for her appointment at outpatient rehab so she was able to be seen for bandaging by PT working at breast clinic.  Unable to do manual lymph drainage due to not having a table/room available.   Pertinent History Right breast cancer with mastectomy 2011; reconstruction; left breast reduction; h/o right UE lymphedema and right upper quadrant pain, treated in this clinic several times before.  Recent scare with left breeast, but but ultrasound and mammogram were negative.   Currently in Pain? No/denies               LYMPHEDEMA/ONCOLOGY QUESTIONNAIRE - 05/07/15 1344    Right Upper Extremity Lymphedema   At Axilla  43.3 cm   15 cm Proximal to Olecranon Process 46.9  cm   10 cm Proximal to Olecranon Process 45.8 cm   Olecranon Process 31.8 cm   15 cm Proximal to Ulnar Styloid Process 30.9 cm   10 cm Proximal to Ulnar Styloid Process 26.4 cm   Just Proximal to Ulnar Styloid Process 17.8 cm   Across Hand at [employer] 20.9 cm   At [place] of 2nd Digit 6.2 cm                  [facility] Adult PT Treatment/Exercise - 05/07/15 0001    Manual Therapy   Compression Bandaging .  Applied biotone to arm Bandaging with med TG soft, Elastomull to all fingers; Artiflex x 1; 1-6 cm. bandage on hand, 1-10, 3-12 cm. short stretch bandages wrist to shoulder. with one layer in herringbone pattern with  last 12 cm at upper arm .              [facility] Goals - 04/18/15 1248    CC Long Term Goal  #1   Title Right arm at 15 cm above the olecranon will decrease by 1 cm   (target 05/29/2015)   Baseline 50    Time 4   Period Weeks   Status New   CC Long Term Goal  #2   Title Pt will verbalize she knows what to do to manage her lymphedema at home and a plan for how she will do that. ( 05/18/2015)   Time 4   Period Weeks   Status New            Plan - 05/07/15 1425    Clinical Impression Statement All arm measurements have reduced.  She is pleased with her progress.  She was seen today only for arm measurements and bandaging due to needing to see her at the cancer center.  She would benefit from manual lymph drainage and was encouraged to arrive at her next appointment on time to allow for a full treatment.   Pt will benefit from skilled therapeutic intervention in order to improve on the following deficits Increased edema   Rehab Potential Good   Clinical Impairments Affecting Rehab Potential multiple medical and chronic pain issues   PT Frequency 3x / week   PT Duration 4 weeks   PT Treatment/Interventions ADLs/Self Care Home Management;Compression bandaging;Patient/family education;Manual techniques;Manual lymph drainage   PT Next Visit Plan Manual lymph drainage, compression bandaging.     Consulted and Agree with Plan of Care Patient        Problem List Patient Active Problem List   Diagnosis Date Noted  . Myofacial muscle pain 04/29/2015  . Adhesive capsulitis of right shoulder 04/29/2015  . Type 2 diabetes mellitus without complication ([place]) 123456  . Lower extremity edema 10/01/2014  . Breast cancer, right breast ([place]) 05/24/2014  . Myalgia and myositis 05/17/2014  . Neoplasm related pain 03/12/2014  . CN (constipation) 11/26/2013  . H/O reduction mammoplasty 10/30/2013  . Seizure ([place]) 10/06/2013  . Status post bilateral breast implants 08/31/2013   . Acquired absence of breast and absent nipple 08/23/2013  . S/P breast reconstruction, right 08/23/2013  . AC joint arthropathy 07/02/2013  . Routine adult health maintenance 05/29/2013  . Hypokalemia 05/21/2013  . Chronic pain 04/19/2013  . Elevated blood sugar 03/09/2013  . Wheezing 02/22/2013  . Acute bronchitis 02/22/2013  . Lymphedema 05/24/2011  . RHINOSINUSITIS, CHRONIC 04/08/2010  . Obesity 08/04/2006  . DEPRESSIVE DISORDER, NOS 08/04/2006  . RHINITIS, ALLERGIC 08/04/2006  Karen Hopkins, Karen Hopkins 05/07/2015 2:28 PM  [place], [place], [address] Phone: [phone]   Fax:  [phone]  Name: Karen Hopkins MRN: [id number] Date of Birth: 1975/10/20

## 2015-05-07 NOTE — Patient Instructions (Signed)
Goserelin injection What is this medicine? GOSERELIN (GOE se rel in) is similar to a hormone found in the body. It lowers the amount of sex hormones that the body makes. Men will have lower testosterone levels and women will have lower estrogen levels while taking this medicine. In men, this medicine is used to treat prostate cancer; the injection is either given once per month or once every 12 weeks. A once per month injection (only) is used to treat women with endometriosis, dysfunctional uterine bleeding, or advanced breast cancer. This medicine may be used for other purposes; ask your health care provider or pharmacist if you have questions. COMMON BRAND NAME(S): Zoladex What should I tell my health care provider before I take this medicine? They need to know if you have any of these conditions (some only apply to women): -diabetes -heart disease or previous heart attack -high blood pressure -high cholesterol -kidney disease -osteoporosis or low bone density -problems passing urine -spinal cord injury -stroke -tobacco smoker -an unusual or allergic reaction to goserelin, hormone therapy, other medicines, foods, dyes, or preservatives -pregnant or trying to get pregnant -breast-feeding How should I use this medicine? This medicine is for injection under the skin. It is given by a health care professional in a hospital or clinic setting. Men receive this injection once every 4 weeks or once every 12 weeks. Women will only receive the once every 4 weeks injection. Talk to your pediatrician regarding the use of this medicine in children. Special care may be needed. Overdosage: If you think you have taken too much of this medicine contact a poison control center or emergency room at once. NOTE: This medicine is only for you. Do not share this medicine with others. What if I miss a dose? It is important not to miss your dose. Call your doctor or health care professional if you are unable to  keep an appointment. What may interact with this medicine? -female hormones like estrogen -herbal or dietary supplements like black cohosh, chasteberry, or DHEA -female hormones like testosterone -prasterone This list may not describe all possible interactions. Give your health care provider a list of all the medicines, herbs, non-prescription drugs, or dietary supplements you use. Also tell them if you smoke, drink alcohol, or use illegal drugs. Some items may interact with your medicine. What should I watch for while using this medicine? Visit your doctor or health care professional for regular checks on your progress. Your symptoms may appear to get worse during the first weeks of this therapy. Tell your doctor or healthcare professional if your symptoms do not start to get better or if they get worse after this time. Your bones may get weaker if you take this medicine for a long time. If you smoke or frequently drink alcohol you may increase your risk of bone loss. A family history of osteoporosis, chronic use of drugs for seizures (convulsions), or corticosteroids can also increase your risk of bone loss. Talk to your doctor about how to keep your bones strong. This medicine should stop regular monthly menstration in women. Tell your doctor if you continue to menstrate. Women should not become pregnant while taking this medicine or for 12 weeks after stopping this medicine. Women should inform their doctor if they wish to become pregnant or think they might be pregnant. There is a potential for serious side effects to an unborn child. Talk to your health care professional or pharmacist for more information. Do not breast-feed an infant while taking   this medicine. Men should inform their doctors if they wish to father a child. This medicine may lower sperm counts. Talk to your health care professional or pharmacist for more information. What side effects may I notice from receiving this  medicine? Side effects that you should report to your doctor or health care professional as soon as possible: -allergic reactions like skin rash, itching or hives, swelling of the face, lips, or tongue -bone pain -breathing problems -changes in vision -chest pain -feeling faint or lightheaded, falls -fever, chills -pain, swelling, warmth in the leg -pain, tingling, numbness in the hands or feet -signs and symptoms of low blood pressure like dizziness; feeling faint or lightheaded, falls; unusually weak or tired -stomach pain -swelling of the ankles, feet, hands -trouble passing urine or change in the amount of urine -unusually high or low blood pressure -unusually weak or tired Side effects that usually do not require medical attention (report to your doctor or health care professional if they continue or are bothersome): -change in sex drive or performance -changes in breast size in both males and females -changes in emotions or moods -headache -hot flashes -irritation at site where injected -loss of appetite -skin problems like acne, dry skin -vaginal dryness This list may not describe all possible side effects. Call your doctor for medical advice about side effects. You may report side effects to FDA at 1-800-FDA-1088. Where should I keep my medicine? This drug is given in a hospital or clinic and will not be stored at home. NOTE: This sheet is a summary. It may not cover all possible information. If you have questions about this medicine, talk to your doctor, pharmacist, or health care provider.  2015, [employer]. (2013-07-31 11:10:35)  

## 2015-05-07 NOTE — Progress Notes (Signed)
ID: Karen Hopkins   DOB: 1975-09-01  MR#: [id number]  GSU#:[id number]  PCP: Chrisandra Netters, MD GYN: SU: Osborn Coho, MD;  Theodoro Kos, MD OTHER MD: Alysia Penna, MD;  Melrose Nakayama, MD  CHIEF COMPLAINT:  Locally advanced right breast cancer  CURRENT TREATMENT: Tamoxifen; goserelin  HISTORY OF PRESENT ILLNESS: From the original intake note:  The patient noted swelling and redness, as well as some tenderness, over her right breast for some time. She thought this might be related to pregnancy, since she delivered her third child about 8 months ago. As the problem did not improve, she brought it to her primary care physician's attention. He treated her with Bactrim, which she says she could not tolerate because of nausea and vomiting, but which in any case did not resolve the problem, so he set her up for mammography at [facility] 08/26/2009. Dr. Sadie Haber found by exam marked skin thickening and erythema of the right breast extending pretty much throughout the breast. By mammography there were scattered fibroglandular densities, and an asymmetric density in the right upper outer quadrant with suspicious microcalcifications. There were also enlarged right axillary lymph nodes. The left appeared normal. By ultrasound there was an ill defined area of hypoechoic tissue in the right upper outer quadrant that was difficult to measure. There were also abnormal right axillary lymph nodes.  Ultrasound guided biopsy was performed the same day, and showed [phone]) a poorly differentiated invasive ductal carcinoma, which was estrogen receptor poor at 3%, progesterone receptor positive at 14% with an elevated proliferation marker at 50%, but importantly with strong amplification of HER-2 by CISH with a ratio of 5.20.  Bilateral breast MRIs were obtained 09/02/2009. This showed an area up to 10.6 cm in the right breast showing confluent mass and non-mass enhancement. There was marked skin  thickening involving the entire right breast, and numerous bulky right axillary lymph nodes were identified. The left breast was unremarkable, and there was no evidence of internal mammary lymph node involvement.  Patient was treated in the neoadjuvant setting with 4 dose dense cycles of doxorubicin and cyclophosphamide, followed by 12 weekly doses of paclitaxel and trastuzumab. Karen Hopkins underwent definitive right modified radical mastectomy in October 2011, with a residual 2.5 mm area of tumor in the breast, grade 3, and one of 21 lymph nodes involved.   Her subsequent history is as detailed below  INTERVAL HISTORY: Karen Hopkins returns today for follow-up of her breast cancer. Sometime since her last visit, she stopped the tamoxifen due to intolerance. She was tired of the hot flashes, joint aches, and vaginal dryness. She continues on goserelin monthly, which likely contributed to these symptoms.   REVIEW OF SYSTEMS: Karen Hopkins denies fevers, chills, nausea or vomiting. She has some mild constipation while on oxycodone for chronic pain, managed by a pain clinic. She has right upper arm lymphedema that is wrapped by physical therapy a few times weekly. She also has worsening pitting edema to her bilateral lower extremities. She is on maxide daily and was on lasix, until she was suddenly taken off of it by her PCP. She does not know why. She wonders if her circulatory system is ok. She continues on xanax and celexa for her depression and anxiety. She has chronic right orbital headaches stemming from allergies. Her weight is stable. She is short of breath with exertion. A detailed review of systems is otherwise stable. Karen Hopkins Kitchen  PAST MEDICAL HISTORY: Past Medical History  Diagnosis Date  . Seasonal allergies   .  Lymphedema of arm     right; no BP or puncture to right arm  . Depression   . Anxiety   . Sinus headache   . Hypertension     under control with med., has been on med. x 2 yr.  . History of breast cancer  2011    right  . History of chemotherapy 2011  . History of radiation therapy 2011  . Cancer [facility])     PAST SURGICAL HISTORY: Past Surgical History  Procedure Laterality Date  . Supra-umbilical hernia  0092  . Nasal septum surgery    . Latissimus flap to breast Right 03/12/2013    Procedure: RIGHT BREAST LATISSIMUS FLAP WITH EXPANDER PLACEMENT;  Surgeon: Theodoro Kos, DO;  Location: [place];  Service: Plastics;  Laterality: Right;  . Cesarean section  07/15/2001; 03/18/2004; 11/21/2008  . Tubal ligation  11/21/2008  . Portacath placement Left 09/12/2009  . Modified radical mastectomy Right 03/11/2010  . Port-a-cath removal Left 12/01/2010  . Removal of tissue expander and placement of implant Right 08/23/2013    Procedure: REMOVAL RIGHT TISSUE EXPANDER AND PLACEMENT OF IMPLANT TO RIGHT BREAST ;  Surgeon: Theodoro Kos, DO;  Location: [place];  Service: Plastics;  Laterality: Right;  . Breast reduction surgery Left 08/23/2013    Procedure: LEFT BREAST REDUCTION  ;  Surgeon: Theodoro Kos, DO;  Location: [place];  Service: Plastics;  Laterality: Left;  . Liposuction Bilateral 08/23/2013    Procedure: LIPOSUCTION;  Surgeon: Theodoro Kos, DO;  Location: [place];  Service: Plastics;  Laterality: Bilateral;    FAMILY HISTORY Family History  Problem Relation Age of Onset  . Hypertension Mother   . Diabetes type II Father   . Prostate cancer Father   There is no history of breast or ovarian cancer in the immediate family, but of the patient's mother's mother's six sisters, two (the patient's great-aunts) had ovarian cancer.  GYNECOLOGIC HISTORY:   (Updated 11/26/2013) The patient is GX, P3. First child was premature. Age at first delivery, 39 years old.   she status post tubal ligation. She continues to have menstrual cycles, although irregularly.  SOCIAL HISTORY: (Updated 11/26/2013) She is currently disabled. Elea's children are Karen Hopkins, and Karen Hopkins. They are 11, 10 and 5 as of DEC 2015. They are all boys, all at home. The patient attends a non-denominational church in [place], which she considers her home, but she is currently living in [place].   ADVANCED DIRECTIVES: not in place  HEALTH MAINTENANCE:  (Updated 11/26/2013) Social History  Substance Use Topics  . Smoking status: Former Smoker -- 0.25 packs/day for 5 years    Quit date: 04/26/2010  . Smokeless tobacco: Never Used  . Alcohol Use: No    Colonoscopy: Never  PAP: April 2015  Bone density: Never  Lipid panel: August 2014    Allergies  Allergen Reactions  . Lyrica [Pregabalin] Nausea Only    .  Karen Hopkins Kitchen Meloxicam Nausea Only  . Penicillins Swelling    FACIAL SWELLING  . Robaxin [Methocarbamol] Nausea Only    Current Outpatient Prescriptions  Medication Sig Dispense Refill  . ACCU-CHEK SMARTVIEW test strip USE TO CHECK BLOOD SUGAR LEVELS FOUR TIMES DAILY AS DIRECTED 100 each 3  . ALPRAZolam (XANAX) 1 MG tablet TAKE ONE (1) TABLET BY MOUTH TWO (2) TIMES DAILY (Patient taking differently: TAKE ONE TABLET(1 MG) DAILY) 60 tablet 1  . blood glucose meter kit and supplies KIT Dispense based on  patient and insurance preference. Use up to four times daily as directed. (FOR ICD-9 250.00, 250.01). 1 each 0  . citalopram (CELEXA) 20 MG tablet TAKE ONE (1) TABLET BY MOUTH EVERY DAY 30 tablet 11  . furosemide (LASIX) 20 MG tablet Take 1 tablet (20 mg total) by mouth daily. 30 tablet 3  . metFORMIN (GLUCOPHAGE) 500 MG tablet Take 1 tablet (500 mg total) by mouth 2 (two) times daily with a meal. 60 tablet 11  . oxyCODONE (OXY IR/ROXICODONE) 5 MG immediate release tablet Take 1 tablet (5 mg total) by mouth every 6 (six) hours as needed for severe pain. 120 tablet 0  . polyethylene glycol powder (GLYCOLAX) powder Take 17 g by mouth daily. 500 g 5  . potassium chloride (MICRO-K) 10 MEQ CR capsule Take 2 capsules (20 mEq total) by mouth 2 (two) times daily. 180  capsule 3  . tiZANidine (ZANAFLEX) 4 MG tablet TAKE ONE (1) TABLET BY MOUTH 3 TIMES DAILY 90 tablet 3  . triamterene-hydrochlorothiazide (MAXZIDE-25) 37.5-25 MG tablet TAKE ONE (1) TABLET BY MOUTH EVERY DAY 30 tablet 4  . anastrozole (ARIMIDEX) 1 MG tablet Take 1 tablet (1 mg total) by mouth daily. 30 tablet 4  . fluticasone (FLONASE) 50 MCG/ACT nasal spray Place 2 sprays into both nostrils daily. 16 g 12  . naproxen (NAPROSYN) 500 MG tablet TAKE ONE (1) TABLET BY MOUTH 3 TIMES DAILY AS NEEDED FOR HEADACHE (Patient not taking: Reported on 05/07/2015) 90 tablet 0   No current facility-administered medications for this visit.    OBJECTIVE: Young African American woman in no acute distress Filed Vitals:   05/07/15 1111  BP: 132/87  Pulse: 96  Temp: 98 F (36.7 C)  Resp: 19     Body mass index is 45.96 kg/(m^2).    ECOG FS: 2   Filed Weights   05/07/15 1111  Weight: 267 lb 14.4 oz (121.519 kg)   Skin: warm, dry  HEENT: sclerae anicteric, conjunctivae pink, oropharynx clear. No thrush or mucositis.  Lymph Nodes: No cervical or supraclavicular lymphadenopathy  Lungs: clear to auscultation bilaterally, no rales, wheezes, or rhonci  Heart: regular rate and rhythm  Abdomen: round, soft, non tender, positive bowel sounds  Musculoskeletal: No focal spinal tenderness, bilateral lower extremity pitting edema. Right upper arm wrapped due to grade 2 lymphedema Neuro: non focal, well oriented, positive affect  Breasts: right breast status post mastectomy with implant reconstruction and radiation. No evidence of recurrent disease. Right axilla benign. Left breast unremarkable.  LAB RESULTS: Lab Results  Component Value Date   WBC 10.7* 04/10/2015   NEUTROABS 5.7 04/10/2015   HGB 12.5 04/10/2015   HCT 38.1 04/10/2015   MCV 86.5 04/10/2015   PLT 283 04/10/2015      Chemistry      Component Value Date/Time   NA 142 04/10/2015 1239   NA 138 10/02/2014 0854   K 3.3* 04/10/2015 1239   K  4.0 10/02/2014 0854   CL 103 10/02/2014 0854   CL 105 09/27/2012 1044   CO2 28 04/10/2015 1239   CO2 25 10/02/2014 0854   BUN 13.7 04/10/2015 1239   BUN 13 10/02/2014 0854   CREATININE 0.7 04/10/2015 1239   CREATININE 0.59 10/02/2014 0854   CREATININE 0.67 08/20/2013 1155      Component Value Date/Time   CALCIUM 9.3 04/10/2015 1239   CALCIUM 9.1 10/02/2014 0854   ALKPHOS 75 04/10/2015 1239   ALKPHOS 65 11/24/2011 0903   AST 15 04/10/2015 1239  AST 13 11/24/2011 0903   ALT 21 04/10/2015 1239   ALT 13 11/24/2011 0903   BILITOT <0.30 04/10/2015 1239   BILITOT 0.1* 11/24/2011 0903     CLINICAL DATA: Malignant right mastectomy 2011, pathology high-grade invasive ductal carcinoma and DCIS with lymphovascular invasion and 1/20 positive left axillary lymph nodes. Neoadjuvant chemotherapy and adjuvant radiation therapy. Left breast reduction in March, 2015. Patient complains of skin thickening in the lower inner left breast since the time of her reduction surgery which has not changed.  EXAM: DIGITAL DIAGNOSTIC LEFT MAMMOGRAM WITH CAD  ULTRASOUND LEFT BREAST  COMPARISON: Mammography 04/16/2014 and earlier. Left breast ultrasound 04/16/2014.  ACR Breast Density Category b: There are scattered areas of fibroglandular density.  FINDINGS: CC and MLO views of the left breast were obtained. Persistent mild skin thickening involving the lower and inner left breast, unchanged since the examination 4 months ago. Stable post surgical scarring involving the lower left breast. No findings suspicious for malignancy in the left breast.  Mammographic images were processed with CAD.  On physical exam, there is no palpable mass in the inner left breast in the area of skin thickening.  Targeted ultrasound is performed, showing persistent skin thickening involving the lower inner quadrant of the left breast, the skin measuring up to 6 mm in thickness, with visible edema.  Benign oil cysts in the upper outer quadrant are again demonstrated and unchanged, the largest at the 10 o'clock position approximately 4 cm from the nipple measuring approximately 4 mm. No suspicious solid mass or abnormal acoustic shadowing is identified.  IMPRESSION: 1. No mammographic or sonographic evidence of malignancy, left breast. 2. Stable skin thickening/edema involving the lower inner quadrant of the left breast in the area of patient concern. 3. Stable benign fat necrosis/oil cysts in the upper outer quadrant of the left breast.  RECOMMENDATION: Screening mammogram in one year.(Code:[id number]-B-01Y)  I have discussed the findings and recommendations with the patient. Results were also provided in writing at the conclusion of the visit. If applicable, a reminder letter will be sent to the patient regarding the next appointment.  BI-RADS CATEGORY 2: Benign.   Electronically Signed  By: Evangeline Dakin M.D.  On: 08/13/2014 11:52       ASSESSMENT: 39 y.o.  BRCA1 and BRCA2 negative [place] woman, status post right breast biopsy in March 2011 for a high-grade invasive ductal carcinoma, ER positive 3%, PR positive 14%, strongly HER2/neu positive with MIB-1 of 15%. Biopsy proven lymph node involvement at presentation. Changes consistent with inflammatory carcinoma.   1. Treated neoadjuvantly, status post 4 dose-dense cycles of doxorubicin/cyclophosphamide, followed by 12 weekly doses of paclitaxel with trastuzumab completed September 2011.   2. Trastuzumab was then continued for a total of 1 year [to June 2012]. Post-treatment echo showed a well-preserved ejection fraction.   3. Status post right modified radical mastectomy in October 2011 showing a ypT1a ypN1 invasive ductal carcinoma, grade 3.  Status post right reconstruction with implant, March 2015   4. postmastectomy radiation completed in January 2012  5. on tamoxifen beginning January of 2012 stopped  somewhere in Spring/Summer 2016  6. other problems include chronic postsurgical pain, chronic right upper extremity lymphedema, and chronic depression  7. Goserelin started 09/24/2014, to be repeated every 4 weeks  PLAN: Karen Hopkins and I spent 30 minutes together in this appointment. From a breast cancer stand point she is doing well with no evidence of recurrent disease. Unfortunately, she stopped the tamoxifen earlier this year because she didn't  like the accompanying side effects. She understands the need for antiestrogen therapy in an estrogen positive breast cancer, and is open to other options. Because she is in "medical menopause" with the goserelin, she is eligible to try an aromatase inhibitor. I have written a prescription for anastrozole and review the potential side effects such as vaginal dryness, hot flashes, arthralgias/myalgias, and bone density loss. She will start this immediately.   I am sending her to Dr. Erik Obey to evaluate her right orbital headaches stemming from likely allergies. She will try nasocort in the meantime to see if her sinuses don't open better with this. I also encouraged use of a netti pot.   We discussed her chronic swelling and she is interested in visiting a cardiologist to make sure she is not experiencing congestive heart failure symptoms. I am increasing her potassium dose to 20 meq BID because of a potassium level of 3.3 today despite current BID supplements  Timea will return in 4 months for follow up of these new interventions, after her next mammogram due in March. She understands and agrees with this plan. She knows the goal of treatment is cure. She has been encouraged to call with any issues that might arise before her next visit here.   Karen Panda, NP  05/07/2015

## 2015-05-07 NOTE — Telephone Encounter (Signed)
Gave and printed appt schede and avs for pt for DEC thru March 2017.Marland KitchenOtolaryngology needs auth for referral from PCP due to medicaid [place] and lvm for Dr. Tempie Hoist office to call pt with appt

## 2015-05-08 ENCOUNTER — Encounter: Payer: Self-pay | Admitting: Nurse Practitioner

## 2015-05-09 ENCOUNTER — Ambulatory Visit: Payer: Medicare Other | Attending: Physical Medicine & Rehabilitation | Admitting: Physical Therapy

## 2015-05-09 DIAGNOSIS — I890 Lymphedema, not elsewhere classified: Secondary | ICD-10-CM | POA: Insufficient documentation

## 2015-05-09 DIAGNOSIS — E8989 Other postprocedural endocrine and metabolic complications and disorders: Secondary | ICD-10-CM | POA: Insufficient documentation

## 2015-05-09 NOTE — Therapy (Signed)
[place], [place], [address] Phone: [phone]   Fax:  [phone]  Physical Therapy Treatment  Patient Details  Name: Karen Hopkins MRN: [id number] Date of Birth: Oct 06, 1975 Referring Provider: Jana Hakim   Encounter Date: 05/09/2015      PT End of Session - 05/09/15 1031    Visit Number 7   Number of Visits 13   Date for PT Re-Evaluation 05/18/15   PT Start Time 0930   PT Stop Time 1015   PT Time Calculation (min) 45 min   Activity Tolerance Patient tolerated treatment well   Behavior During Therapy [facility] for tasks assessed/performed      Past Medical History  Diagnosis Date  . Seasonal allergies   . Lymphedema of arm     right; no BP or puncture to right arm  . Depression   . Anxiety   . Sinus headache   . Hypertension     under control with med., has been on med. x 2 yr.  . History of breast cancer 2011    right  . History of chemotherapy 2011  . History of radiation therapy 2011  . Cancer [facility])     Past Surgical History  Procedure Laterality Date  . Supra-umbilical hernia  7371  . Nasal septum surgery    . Latissimus flap to breast Right 03/12/2013    Procedure: RIGHT BREAST LATISSIMUS FLAP WITH EXPANDER PLACEMENT;  Surgeon: Theodoro Kos, DO;  Location: [place];  Service: Plastics;  Laterality: Right;  . Cesarean section  07/15/2001; 03/18/2004; 11/21/2008  . Tubal ligation  11/21/2008  . Portacath placement Left 09/12/2009  . Modified radical mastectomy Right 03/11/2010  . Port-a-cath removal Left 12/01/2010  . Removal of tissue expander and placement of implant Right 08/23/2013    Procedure: REMOVAL RIGHT TISSUE EXPANDER AND PLACEMENT OF IMPLANT TO RIGHT BREAST ;  Surgeon: Theodoro Kos, DO;  Location: [place];  Service: Plastics;  Laterality: Right;  . Breast reduction surgery Left 08/23/2013    Procedure: LEFT BREAST REDUCTION  ;  Surgeon: Theodoro Kos, DO;  Location:  [place];  Service: Plastics;  Laterality: Left;  . Liposuction Bilateral 08/23/2013    Procedure: LIPOSUCTION;  Surgeon: Theodoro Kos, DO;  Location: [place];  Service: Plastics;  Laterality: Bilateral;    There were no vitals filed for this visit.  Visit Diagnosis:  Lymphedema of upper extremity following lymphadenectomy      Subjective Assessment - 05/09/15 0938    Subjective Pt states she is taking a different drug instead of tamoxofen, but she doesn't remember what the name of it is    Pertinent History Right breast cancer with mastectomy 2011; reconstruction; left breast reduction; h/o right UE lymphedema and right upper quadrant pain, treated in this clinic several times before.  Recent scare with left breeast, but but ultrasound and mammogram were negative.                         [facility] Adult PT Treatment/Exercise - 05/09/15 0001    Neck Exercises: Seated   Other Seated Exercise neck and upper thoracic range of motion with 1-2 reps each    Manual Therapy   Manual Lymphatic Drainage (MLD) In Supine: Short neck, Lt axilla nodes and upper quadrant and Rt inguinal nodes, anterior inter-axillary and Rt axillo-inguinal anastomosis and Rt UE from dorsal hand to lateral shoulder.    Compression  Bandaging biotone to arm Bandaging with med TG soft Artiflex x 2 1-6 cm. bandage on hand, 1-10, 3-12 cm short stretch bandages from wrist to axilla                   PT Short Term Goals - 04/03/15 1420    PT SHORT TERM GOAL #1   Title Patient will report a 25% reduction in pain with usual daily activities and home chores.   Baseline fluctuates from 4/10 to 8/10, 6/10 today   Time 6   Period Weeks   Status Partially Met   PT SHORT TERM GOAL #2   Title Patient will be able to resume a basic home ex program with minimal increase in pain    Baseline pain tends to fluctuate due to R upper trap compensatory spasms   Time 6   Period  Weeks   Status Partially Met   PT SHORT TERM GOAL #3   Title Right shoulder flexion improved to  115 degrees needed for reaching shoulder height shelves.   Baseline Right shoulder flex 114, abd 110   Time 6   Period Weeks   Status Partially Met   PT SHORT TERM GOAL #4   Title Berg improved  10 points form baseline   Baseline 49/56   Time 6   Period Weeks   Status Partially Met         Short Term Clinic Goals - 09/03/14 1217    CC Short Term Goal  #1   Title short term goals= long term goals          PT Long Term Goals - 04/03/15 1422    PT LONG TERM GOAL #1   Title Independent with progressive HEP for further ROM, strengthening of Right UE.   Time 12   Period Weeks   Status Achieved   PT LONG TERM GOAL #2   Title Right shoulder flexion and abduction AROM improved to 125 degrees needed for reaching eye level shelves in the kitchen.     Baseline AROM flex 114, abd 110   Time 12   Period Weeks   Status Partially Met   PT LONG TERM GOAL #3   Title Right UE strength grossly 4-/5 needed for basic cooking/cleaning for her 3 children.     Baseline Right UE 4/5 to 4+/5   Time 12   Period Weeks   Status Achieved   PT LONG TERM GOAL #4   Title FOTO improved 20 ppoints for indication of overall improved function   Baseline 59% today just less than 20 from 76%   Time 12   Period Weeks   Status Partially Met   PT LONG TERM GOAL #5   Title Patient will report overall improvement in pain level 3/10 level or better  and improve  function at 50%.   Baseline 6/10 but sporadic 4/10 to 8/10  Pt able to perform personal hygiene with 50% ease   Time 12   Period Weeks   Status Partially Met   PT LONG TERM GOAL #6   Title Pt will be able to manage pain at night in order to sleep 4 or more hours of restorative sleep. Pt will verbalize 3 pain management strategies for sleep/rest   Time 12   Period Weeks   Status Achieved   PT LONG TERM GOAL #7   Title Pt will incorporate walking  program in to daily life to encourage aerobic exercise for health  Pt  to work up to 1/4 a mile   Baseline Pt knowledgeable about walking program.  but busyness of life prevents completion   Time 12   Period Weeks   Status Partially [facility] Goals - 2015/06/04 1034    CC Long Term Goal  #1   Title Right arm at 15 cm above the olecranon will decrease by 1 cm   (target 05/29/2015)  46.5 on November 30   Status Achieved   CC Long Term Goal  #2   Title Pt will verbalize she knows what to do to manage her lymphedema at home and a plan for how she will do that. ( 05/18/2015)   Status Achieved            Plan - June 04, 2015 1031    Clinical Impression Statement Pt has had reduction in circumferential meausurements and now should be able to continue home manangement with her compression sleeve and glove.  She plans to call [employer] to have them come out again and recheck the fit of her machine so she can continue intermittent compression pump at home . She verbalized a plan to wear her Joneen Caraway Sleeve and thick Tg soft to apply compression at night.  Patient inproved score on Lymphedema  Life Impact Scale from 81% to 62% impaired . Goals for this episode have been met    Clinical Impairments Affecting Rehab Potential multiple medical and chronic pain issues   PT Next Visit Plan Discharge this episode    Consulted and Agree with Plan of Care Patient          G-Codes - 06-04-15 1024    Functional Assessment Tool Used Lymphedema life impact scale score of 42 or 62% impairment    Functional Limitation Self care   Carrying, Moving and Handling Objects Goal Status (A2505) At least 40 percent but less than 60 percent impaired, limited or restricted   Carrying, Moving and Handling Objects Discharge Status [phone]) At least 60 percent but less than 80 percent impaired, limited or restricted      Problem List Patient Active Problem List   Diagnosis Date Noted  . Myofacial  muscle pain 04/29/2015  . Adhesive capsulitis of right shoulder 04/29/2015  . Type 2 diabetes mellitus without complication ([place]) 34/19/3790  . Lower extremity edema 10/01/2014  . Breast cancer, right breast ([place]) 05/24/2014  . Myalgia and myositis 05/17/2014  . Neoplasm related pain 03/12/2014  . CN (constipation) 11/26/2013  . H/O reduction mammoplasty 10/30/2013  . Seizure ([place]) 10/06/2013  . Status post bilateral breast implants 08/31/2013  . Acquired absence of breast and absent nipple 08/23/2013  . S/P breast reconstruction, right 08/23/2013  . AC joint arthropathy 07/02/2013  . Routine adult health maintenance 05/29/2013  . Hypokalemia 05/21/2013  . Chronic pain 04/19/2013  . Elevated blood sugar 03/09/2013  . Wheezing 02/22/2013  . Acute bronchitis 02/22/2013  . Lymphedema 05/24/2011  . RHINOSINUSITIS, CHRONIC 04/08/2010  . Obesity 08/04/2006  . DEPRESSIVE DISORDER, NOS 08/04/2006  . RHINITIS, ALLERGIC 08/04/2006     PHYSICAL THERAPY DISCHARGE SUMMARY  Visits from Start of Care: 7  Current functional level related to goals / functional outcomes: Pt is happy to have some reduction in arm, especially in upper arm and feels that she can continue management on her own at home.   Remaining deficits: Lymphedema of right arm   Education / Equipment: Reinforcement of home management techniques  Plan: Patient  agrees to discharge.  Patient goals were partially met. Patient is being discharged due to meeting the stated rehab goals.  ?????        Donato Heinz. Owens Shark, PT  05/09/2015, 10:37 AM  [place], [place], [address] Phone: [phone]   Fax:  [phone]  Name: Karen Hopkins MRN: [id number] Date of Birth: [birth year]

## 2015-05-12 ENCOUNTER — Ambulatory Visit: Payer: Medicare Other

## 2015-05-14 ENCOUNTER — Ambulatory Visit: Payer: Medicare Other | Admitting: Physical Therapy

## 2015-05-15 ENCOUNTER — Ambulatory Visit: Payer: Medicare Other

## 2015-05-16 ENCOUNTER — Encounter: Payer: Medicare Other | Admitting: Physical Therapy

## 2015-05-27 ENCOUNTER — Other Ambulatory Visit: Payer: Self-pay | Admitting: Physical Medicine & Rehabilitation

## 2015-05-27 ENCOUNTER — Encounter: Payer: Medicare Other | Attending: Registered Nurse | Admitting: Registered Nurse

## 2015-05-27 ENCOUNTER — Encounter: Payer: Self-pay | Admitting: Registered Nurse

## 2015-05-27 VITALS — BP 133/80 | HR 101 | Resp 16

## 2015-05-27 DIAGNOSIS — I890 Lymphedema, not elsewhere classified: Secondary | ICD-10-CM

## 2015-05-27 DIAGNOSIS — M546 Pain in thoracic spine: Secondary | ICD-10-CM | POA: Insufficient documentation

## 2015-05-27 DIAGNOSIS — G894 Chronic pain syndrome: Secondary | ICD-10-CM | POA: Diagnosis not present

## 2015-05-27 DIAGNOSIS — M7918 Myalgia, other site: Secondary | ICD-10-CM

## 2015-05-27 DIAGNOSIS — M797 Fibromyalgia: Secondary | ICD-10-CM

## 2015-05-27 DIAGNOSIS — Z79899 Other long term (current) drug therapy: Secondary | ICD-10-CM

## 2015-05-27 DIAGNOSIS — Z5181 Encounter for therapeutic drug level monitoring: Secondary | ICD-10-CM

## 2015-05-27 DIAGNOSIS — G893 Neoplasm related pain (acute) (chronic): Secondary | ICD-10-CM | POA: Diagnosis not present

## 2015-05-27 DIAGNOSIS — M545 Low back pain, unspecified: Secondary | ICD-10-CM

## 2015-05-27 MED ORDER — OXYCODONE HCL 5 MG PO TABS: 5.0000 mg | ORAL_TABLET | Freq: Four times a day (QID) | ORAL | Status: DC | PRN

## 2015-05-27 NOTE — Progress Notes (Signed)
Subjective:    Patient ID: Karen Hopkins, female    DOB: [birth year], 39 y.o.   MRN: [id number]  HPI: [name] is a 39 year old female who returns for follow up for chronic pain and medication refill. She says her pain is located in her right shoulder and mid- lower back.She rates her pain 5. Her current exercise regime is walking and performing stretching exercises. S/P Trigger Point Injection with good relief noted.  Pain Inventory Average Pain 6 Pain Right Now 5 My pain is sharp, burning, dull, stabbing, tingling and aching  In the last 24 hours, has pain interfered with the following? General activity 4 Relation with others 5 Enjoyment of life 5 What TIME of day is your pain at its worst? all Sleep (in general) Poor  Pain is worse with: walking, bending, inactivity and standing Pain improves with: rest, therapy/exercise, medication, TENS and injections Relief from Meds: 8  Mobility how many minutes can you walk? 10 ability to climb steps?  yes do you drive?  yes  Function disabled: date disabled .  Neuro/Psych numbness tingling spasms confusion depression anxiety  Prior Studies Any changes since last visit?  no  Physicians involved in your care Any changes since last visit?  no   Family History  Problem Relation Age of Onset  . Hypertension Mother   . Diabetes type II Father   . Prostate cancer Father    Social History   Social History  . Marital Status: Single    Spouse Name: N/A  . Number of Children: N/A  . Years of Education: N/A   Social History Main Topics  . Smoking status: Former Smoker -- 0.25 packs/day for 5 years    Quit date: 04/26/2010  . Smokeless tobacco: Never Used  . Alcohol Use: No  . Drug Use: No  . Sexual Activity: Not Currently    Birth Control/ Protection: Surgical   Other Topics Concern  . None   Social History Narrative   Past Surgical History  Procedure Laterality Date  . Supra-umbilical hernia   123456  . Nasal septum surgery    . Latissimus flap to breast Right 03/12/2013    Procedure: RIGHT BREAST LATISSIMUS FLAP WITH EXPANDER PLACEMENT;  Surgeon: Theodoro Kos, DO;  Location: [place];  Service: Plastics;  Laterality: Right;  . Cesarean section  07/15/2001; 03/18/2004; 11/21/2008  . Tubal ligation  11/21/2008  . Portacath placement Left 09/12/2009  . Modified radical mastectomy Right 03/11/2010  . Port-a-cath removal Left 12/01/2010  . Removal of tissue expander and placement of implant Right 08/23/2013    Procedure: REMOVAL RIGHT TISSUE EXPANDER AND PLACEMENT OF IMPLANT TO RIGHT BREAST ;  Surgeon: Theodoro Kos, DO;  Location: [place];  Service: Plastics;  Laterality: Right;  . Breast reduction surgery Left 08/23/2013    Procedure: LEFT BREAST REDUCTION  ;  Surgeon: Theodoro Kos, DO;  Location: [place];  Service: Plastics;  Laterality: Left;  . Liposuction Bilateral 08/23/2013    Procedure: LIPOSUCTION;  Surgeon: Theodoro Kos, DO;  Location: [place];  Service: Plastics;  Laterality: Bilateral;   Past Medical History  Diagnosis Date  . Seasonal allergies   . Lymphedema of arm     right; no BP or puncture to right arm  . Depression   . Anxiety   . Sinus headache   . Hypertension     under control with med., has been on med. x 2 yr.  [facility] Kitchen  History of breast cancer 2011    right  . History of chemotherapy 2011  . History of radiation therapy 2011  . Cancer (HCC)    BP 133/80 mmHg  Pulse 101  Resp 16  SpO2 100%  LMP  (Within Months)  Opioid Risk Score:   Fall Risk Score:  `1  Depression screen PHQ 2/9  Depression screen [facility] 2/9 03/04/2015 01/20/2015 01/07/2015 11/21/2014 10/07/2014 10/01/2014 09/13/2014  Decreased Interest 0 0 0 0 0 0 0  Down, Depressed, Hopeless 0 0 0 0 0 0 0  PHQ - 2 Score 0 0 0 0 0 0 0  Altered sleeping - - - - - - -  Tired, decreased energy - - - - - - -  Change in appetite - - - - - - -  Feeling bad or failure  about yourself  - - - - - - -  Trouble concentrating - - - - - - -  Moving slowly or fidgety/restless - - - - - - -  Suicidal thoughts - - - - - - -  PHQ-9 Score - - - - - - -     Review of Systems  Constitutional: Positive for diaphoresis and unexpected weight change.  All other systems reviewed and are negative.      Objective:   Physical Exam  Constitutional: She is oriented to [place], place, and time. She appears well-developed and well-nourished.  HENT:  Head: Normocephalic and atraumatic.  Neck: Normal range of motion. Neck supple.  Cardiovascular: Normal rate and regular rhythm.   Pulmonary/Chest: Effort normal and breath sounds normal.  Musculoskeletal:  Normal Muscle Bulk and Muscle Testing Reveals: Upper Extremities: Right: Decreased ROM 45 Degrees and Muscle Strength 5/5 Left: Full ROM and Muscle Strength 5/5 Right AC Joint Tenderness Right Rhomboid Tenderness Lumbar Paraspinal Tenderness: L-3- L-5 Lower Extremities: Full ROM and Muscle Strength 5/5 Arises from chair with ease Narrow Based Gait    Neurological: She is alert and oriented to [place], place, and time.  Skin: Skin is warm and dry.  Psychiatric: She has a normal mood and affect.  Nursing note and vitals reviewed.         Assessment & Plan:  1.Myofascial pain syndrome chronic postoperative, as well as post radiation: S/P Breast Reconstruction Surgery x 2.  Refilled: Oxycodone 5 mg one tablet every 6 hours as needed for severe pain.# 120. Continue with Exercise Regime. 2. Obesity: Losing weight continue with Healthy Diet and Exercise Regime.   20 minutes of face to face patient care time was spent during this visit. All questions were encouraged and answered

## 2015-05-28 LAB — PMP ALCOHOL METABOLITE (ETG): ETGU: NEGATIVE ng/mL

## 2015-05-31 LAB — BENZODIAZEPINES (GC/LC/MS), URINE
ALPRAZOLAMU: 193 ng/mL (ref <25)
Clonazepam metabolite (GC/LC/MS), ur confirm: NEGATIVE ng/mL (ref <25)
FLURAZEPAMU: NEGATIVE ng/mL (ref <50)
Lorazepam (GC/LC/MS), ur confirm: NEGATIVE ng/mL (ref <50)
MIDAZOLAMU: NEGATIVE ng/mL (ref <50)
Nordiazepam (GC/LC/MS), ur confirm: NEGATIVE ng/mL (ref <50)
Oxazepam (GC/LC/MS), ur confirm: NEGATIVE ng/mL (ref <50)
TEMAZEPAMU: NEGATIVE ng/mL (ref <50)
Triazolam metabolite (GC/LC/MS), ur confirm: NEGATIVE ng/mL (ref <50)

## 2015-05-31 LAB — OXYCODONE, URINE (LC/MS-MS)
NOROXYCODONE, UR: 786 ng/mL (ref <50)
OXYCODONE, UR: 384 ng/mL (ref <50)
OXYMORPHONE, URINE: 479 ng/mL (ref <50)

## 2015-06-03 ENCOUNTER — Ambulatory Visit (HOSPITAL_BASED_OUTPATIENT_CLINIC_OR_DEPARTMENT_OTHER): Payer: Medicare Other

## 2015-06-03 VITALS — BP 129/79 | HR 96 | Temp 98.4°F

## 2015-06-03 DIAGNOSIS — Z5111 Encounter for antineoplastic chemotherapy: Secondary | ICD-10-CM

## 2015-06-03 DIAGNOSIS — C50911 Malignant neoplasm of unspecified site of right female breast: Secondary | ICD-10-CM | POA: Diagnosis present

## 2015-06-03 MED ORDER — GOSERELIN ACETATE 3.6 MG ~~LOC~~ IMPL
3.6000 mg | DRUG_IMPLANT | Freq: Once | SUBCUTANEOUS | Status: AC
  Administered 2015-06-03: 3.6 mg via SUBCUTANEOUS
  Filled 2015-06-03: qty 3.6

## 2015-06-04 LAB — PRESCRIPTION MONITORING PROFILE (SOLSTAS)
Amphetamine/Meth: NEGATIVE ng/mL
BARBITURATE SCREEN, URINE: NEGATIVE ng/mL
BUPRENORPHINE, URINE: NEGATIVE ng/mL
COCAINE METABOLITES: NEGATIVE ng/mL
Cannabinoid Scrn, Ur: NEGATIVE ng/mL
Carisoprodol, Urine: NEGATIVE ng/mL
Creatinine, Urine: 85.88 mg/dL (ref >20.0)
Fentanyl, Ur: NEGATIVE ng/mL
MDMA URINE: NEGATIVE ng/mL
MEPERIDINE UR: NEGATIVE ng/mL
Methadone Screen, Urine: NEGATIVE ng/mL
NITRITES URINE, INITIAL: NEGATIVE ug/mL
OPIATE SCREEN, URINE: NEGATIVE ng/mL
Propoxyphene: NEGATIVE ng/mL
TAPENTADOLUR: NEGATIVE ng/mL
Tramadol Scrn, Ur: NEGATIVE ng/mL
Zolpidem, Urine: NEGATIVE ng/mL
pH, Initial: 6.6 pH (ref 4.5–8.9)

## 2015-06-18 ENCOUNTER — Other Ambulatory Visit: Payer: Self-pay | Admitting: Physical Medicine & Rehabilitation

## 2015-06-18 ENCOUNTER — Other Ambulatory Visit: Payer: Self-pay | Admitting: Oncology

## 2015-06-25 ENCOUNTER — Encounter: Payer: Medicare Other | Attending: Registered Nurse | Admitting: Registered Nurse

## 2015-06-25 ENCOUNTER — Encounter: Payer: Self-pay | Admitting: Registered Nurse

## 2015-06-25 VITALS — BP 137/78 | HR 99

## 2015-06-25 DIAGNOSIS — I890 Lymphedema, not elsewhere classified: Secondary | ICD-10-CM | POA: Insufficient documentation

## 2015-06-25 DIAGNOSIS — Z79899 Other long term (current) drug therapy: Secondary | ICD-10-CM

## 2015-06-25 DIAGNOSIS — M545 Low back pain, unspecified: Secondary | ICD-10-CM

## 2015-06-25 DIAGNOSIS — Z5181 Encounter for therapeutic drug level monitoring: Secondary | ICD-10-CM

## 2015-06-25 DIAGNOSIS — M797 Fibromyalgia: Secondary | ICD-10-CM | POA: Diagnosis not present

## 2015-06-25 DIAGNOSIS — G893 Neoplasm related pain (acute) (chronic): Secondary | ICD-10-CM | POA: Diagnosis not present

## 2015-06-25 DIAGNOSIS — M7918 Myalgia, other site: Secondary | ICD-10-CM

## 2015-06-25 DIAGNOSIS — G894 Chronic pain syndrome: Secondary | ICD-10-CM

## 2015-06-25 DIAGNOSIS — M546 Pain in thoracic spine: Secondary | ICD-10-CM | POA: Diagnosis present

## 2015-06-25 MED ORDER — OXYCODONE HCL 5 MG PO TABS: 5.0000 mg | ORAL_TABLET | Freq: Four times a day (QID) | ORAL | Status: DC | PRN

## 2015-06-25 NOTE — Progress Notes (Signed)
Subjective:    Patient ID: Karen Hopkins, female    DOB: [birth year], 40 y.o.   MRN: [id number]  HPI: [name] is a 40 year old female who returns for follow up for chronic pain and medication refill. She says her pain is located in her right shoulder and mid- lower back.She rates her pain 7. Her current exercise regime is walking and performing stretching exercises.  Pain Inventory Average Pain 6 Pain Right Now 7 My pain is sharp, burning, dull, stabbing, tingling and aching  In the last 24 hours, has pain interfered with the following? General activity 3 Relation with others 3 Enjoyment of life 3 What TIME of day is your pain at its worst? all Sleep (in general) Poor  Pain is worse with: walking, bending, sitting, inactivity, standing and some activites Pain improves with: rest, therapy/exercise, pacing activities, medication and injections Relief from Meds: n/a  Mobility walk without assistance how many minutes can you walk? 10 ability to climb steps?  yes do you drive?  yes  Function disabled: date disabled .  Neuro/Psych numbness tingling trouble walking spasms dizziness confusion depression anxiety  Prior Studies Any changes since last visit?  no  Physicians involved in your care Any changes since last visit?  no   Family History  Problem Relation Age of Onset  . Hypertension Mother   . Diabetes type II Father   . Prostate cancer Father    Social History   Social History  . Marital Status: Single    Spouse Name: N/A  . Number of Children: N/A  . Years of Education: N/A   Social History Main Topics  . Smoking status: Former Smoker -- 0.25 packs/day for 5 years    Quit date: 04/26/2010  . Smokeless tobacco: Never Used  . Alcohol Use: No  . Drug Use: No  . Sexual Activity: Not Currently    Birth Control/ Protection: Surgical   Other Topics Concern  . None   Social History Narrative   Past Surgical History  Procedure  Laterality Date  . Supra-umbilical hernia  123456  . Nasal septum surgery    . Latissimus flap to breast Right 03/12/2013    Procedure: RIGHT BREAST LATISSIMUS FLAP WITH EXPANDER PLACEMENT;  Surgeon: Theodoro Kos, DO;  Location: [place];  Service: Plastics;  Laterality: Right;  . Cesarean section  07/15/2001; 03/18/2004; 11/21/2008  . Tubal ligation  11/21/2008  . Portacath placement Left 09/12/2009  . Modified radical mastectomy Right 03/11/2010  . Port-a-cath removal Left 12/01/2010  . Removal of tissue expander and placement of implant Right 08/23/2013    Procedure: REMOVAL RIGHT TISSUE EXPANDER AND PLACEMENT OF IMPLANT TO RIGHT BREAST ;  Surgeon: Theodoro Kos, DO;  Location: [place];  Service: Plastics;  Laterality: Right;  . Breast reduction surgery Left 08/23/2013    Procedure: LEFT BREAST REDUCTION  ;  Surgeon: Theodoro Kos, DO;  Location: [place];  Service: Plastics;  Laterality: Left;  . Liposuction Bilateral 08/23/2013    Procedure: LIPOSUCTION;  Surgeon: Theodoro Kos, DO;  Location: [place];  Service: Plastics;  Laterality: Bilateral;   Past Medical History  Diagnosis Date  . Seasonal allergies   . Lymphedema of arm     right; no BP or puncture to right arm  . Depression   . Anxiety   . Sinus headache   . Hypertension     under control with med., has been on med. x  2 yr.  . History of breast cancer 2011    right  . History of chemotherapy 2011  . History of radiation therapy 2011  . Cancer (HCC)    BP 137/78 mmHg  Pulse 99  SpO2 97%  Opioid Risk Score:   Fall Risk Score:  `1  Depression screen PHQ 2/9  Depression screen [facility] 2/9 06/25/2015 03/04/2015 01/20/2015 01/07/2015 11/21/2014 10/07/2014 10/01/2014  Decreased Interest 0 0 0 0 0 0 0  Down, Depressed, Hopeless 0 0 0 0 0 0 0  PHQ - 2 Score 0 0 0 0 0 0 0  Altered sleeping - - - - - - -  Tired, decreased energy - - - - - - -  Change in appetite - - - - - - -  Feeling bad or  failure about yourself  - - - - - - -  Trouble concentrating - - - - - - -  Moving slowly or fidgety/restless - - - - - - -  Suicidal thoughts - - - - - - -  PHQ-9 Score - - - - - - -     Review of Systems  Gastrointestinal: Positive for diarrhea and constipation.  Skin: Positive for rash.  All other systems reviewed and are negative.      Objective:   Physical Exam  Constitutional: She is oriented to [place], place, and time. She appears well-developed and well-nourished.  HENT:  Head: Normocephalic and atraumatic.  Neck: Normal range of motion. Neck supple.  Cardiovascular: Normal rate and regular rhythm.   Pulmonary/Chest: Effort normal and breath sounds normal.  Musculoskeletal:  Normal Muscle Bulk and Muscle Testing Reveals: Upper Extremities:Left: Full ROM and Muscle Strength 5/5 Right: Decreased ROM 45 Degrees and Muscle Strength 5/5 Right Rhomboid Tightness Lumbar Paraspinal Tenderness: L-3- L-5 Lower Extremities: Full ROM and Muscle Strength 5/5 Arises from chair with ease Narrow based gait   Neurological: She is alert and oriented to [place], place, and time.  Skin: Skin is warm and dry.  Psychiatric: She has a normal mood and affect.  Nursing note and vitals reviewed.         Assessment & Plan:  1.Myofascial pain syndrome chronic postoperative, as well as post radiation: S/P Breast Reconstruction Surgery x 2.  Refilled: Oxycodone 5 mg one tablet every 6 hours as needed for severe pain.# 120. Continue with Exercise Regime. 2. Obesity: Losing weight continue with Healthy Diet and Exercise Regime.   20 minutes of face to face patient care time was spent during this visit. All questions were encouraged and answered

## 2015-07-01 ENCOUNTER — Ambulatory Visit (HOSPITAL_BASED_OUTPATIENT_CLINIC_OR_DEPARTMENT_OTHER): Payer: Medicare Other

## 2015-07-01 VITALS — BP 113/72 | HR 92 | Temp 98.1°F

## 2015-07-01 DIAGNOSIS — C50911 Malignant neoplasm of unspecified site of right female breast: Secondary | ICD-10-CM | POA: Diagnosis not present

## 2015-07-01 DIAGNOSIS — Z5111 Encounter for antineoplastic chemotherapy: Secondary | ICD-10-CM

## 2015-07-01 MED ORDER — GOSERELIN ACETATE 3.6 MG ~~LOC~~ IMPL
3.6000 mg | DRUG_IMPLANT | Freq: Once | SUBCUTANEOUS | Status: AC
  Administered 2015-07-01: 3.6 mg via SUBCUTANEOUS
  Filled 2015-07-01: qty 3.6

## 2015-07-04 LAB — HM DIABETES EYE EXAM

## 2015-07-09 ENCOUNTER — Encounter: Payer: Self-pay | Admitting: Family Medicine

## 2015-07-09 DIAGNOSIS — R51 Headache: Secondary | ICD-10-CM

## 2015-07-09 DIAGNOSIS — R519 Headache, unspecified: Secondary | ICD-10-CM | POA: Insufficient documentation

## 2015-07-17 ENCOUNTER — Telehealth: Payer: Self-pay | Admitting: Family Medicine

## 2015-07-17 ENCOUNTER — Other Ambulatory Visit: Payer: Self-pay | Admitting: Oncology

## 2015-07-17 NOTE — Telephone Encounter (Signed)
Pt called because her eye doctor was sending a message to her PCP about getting her a Garment/textile technologist. Karen Hopkins

## 2015-07-18 ENCOUNTER — Telehealth: Payer: Self-pay

## 2015-07-18 ENCOUNTER — Other Ambulatory Visit: Payer: Self-pay | Admitting: *Deleted

## 2015-07-18 MED ORDER — ALPRAZOLAM 1 MG PO TABS: 1.0000 mg | ORAL_TABLET | Freq: Two times a day (BID) | ORAL | Status: DC | PRN

## 2015-07-18 NOTE — Telephone Encounter (Signed)
Pt called - missed call from [facility].   No phone call from clinic notated on chart.  Pt reported expecting refill - advised pt xanax was phoned in to pharmacy today.  Pt voiced understanding.

## 2015-07-21 NOTE — Telephone Encounter (Signed)
Patient needs to be seen here for this. Her eye doctor recommended seeing PCP to discuss the issues she's been having, and also neuro referral. Please ask patient to schedule a visit.  [name], MD

## 2015-07-21 NOTE — Telephone Encounter (Signed)
Pt informed and scheduled for an appt. Consuelo Thayne, CMA  

## 2015-07-22 ENCOUNTER — Ambulatory Visit: Payer: Medicare Other | Admitting: Family Medicine

## 2015-07-24 ENCOUNTER — Ambulatory Visit: Payer: Medicare Other | Admitting: Physical Medicine & Rehabilitation

## 2015-07-25 ENCOUNTER — Ambulatory Visit: Payer: Medicare Other | Admitting: Physical Medicine & Rehabilitation

## 2015-07-28 ENCOUNTER — Encounter: Payer: Medicare Other | Attending: Registered Nurse

## 2015-07-28 ENCOUNTER — Ambulatory Visit (HOSPITAL_BASED_OUTPATIENT_CLINIC_OR_DEPARTMENT_OTHER): Payer: Medicare Other | Admitting: Physical Medicine & Rehabilitation

## 2015-07-28 ENCOUNTER — Encounter: Payer: Self-pay | Admitting: Physical Medicine & Rehabilitation

## 2015-07-28 VITALS — BP 140/62 | HR 106 | Resp 14

## 2015-07-28 DIAGNOSIS — I890 Lymphedema, not elsewhere classified: Secondary | ICD-10-CM | POA: Diagnosis not present

## 2015-07-28 DIAGNOSIS — G243 Spasmodic torticollis: Secondary | ICD-10-CM

## 2015-07-28 DIAGNOSIS — M546 Pain in thoracic spine: Secondary | ICD-10-CM | POA: Diagnosis present

## 2015-07-28 MED ORDER — OXYCODONE HCL 5 MG PO TABS: 5.0000 mg | ORAL_TABLET | Freq: Four times a day (QID) | ORAL | Status: DC | PRN

## 2015-07-28 NOTE — Progress Notes (Signed)
40 year old female with history of right breast carcinoma, Status post modified radical mastectomy with postoperative radiation therapy In 2014. She had subsequent development of chronic right shoulder and right neck pain. She underwent reconstruction with tissue expanders as well as a breast reduction surgery in 2015 She is developed lymphedema in the right upper extremity. This was rather well controlled after wrapping and other lymphedema reduction strategies through PT and OT. This has returned and is now restarting PT for this reason. She is also benefited from trigger point injections to the right trapezius infraspinatus and levator scapula. She's also undergone dry needling through physical therapy. Her last treatment was a couple weeks ago with the dry needling. The current PT is focusing on edema reduction  Overall her pain has improved she think she can reduce her oxycodone from 10 mg to 5 mg. She would like to try a 4 times a day dosing rather than 3 times a day given the duration of effect for the medication.  Patient has increased lymphedema over the last couple days. She has had an upper respiratory infection last several days but this has improved.  Patient has had frontal headaches and the maxillary as well as frontal area. She has seen a ophthalmologist because she had pain in back of the eye. Patient was not diagnosed with glaucoma or any other eye disease but is being monitored closely. Neuro consult was recommended for headaches.  Patient using self massage techniques with a tennis ball for tight trapezius. She avoids overhead reaching with the right upper.  .Patient also complaining of some joint pains has been attributed to her arimidex  Past medical history as noted above tissue expanders as well as breast implants.  Review of systems positive for decreased range of motion the neck, inability to elevate the right arm.  Examination Right shoulder elevation versus left  side Prominent platysma with left lateral bending of the cervical spine. The shoulder elevation is moderate. Range of motion she can get her head past midline with lateral bending but barely, She has tenderness over the right trapezius, right levator scapula, right scalenes, sternocleidomastoid and platysma. Limited range of motion right shoulder Motor strength is 3 minus in the right deltoid 4 at the biceps triceps 5 at the grip. 5/5 strength in left upper extremity Gait is normal no evidence of gait instability. Mood and affect appropriate Gen. No acute distress Twister score 10  severity scale, pain subtotal 37.25  Right arm circumference 10 cm above the olecranon process is 48 cm Impression 1. Cervical dystonia. Twister score total 47.25 placing it in the range to make this diagnosis.patient certainly has other comorbid conditions that contribute to her general pain in the right shoulder and neck area. Do not think there is any sign of cervical stenosis or radiculopathy. Patient has had extensive physical therapy. Has tried oral muscle relaxers as well as narcotic analgesics all which only give her partial relief. Still has significant pain scores. Recommend Botox 100 units  25 units trapezius 25 units levator 25 units sternocleidomastoid 12.5 units platysma 12.5 units scalene  2. Myofascial pain syndrome mainly trapezius, levator scapula and possibly platysma  3. Right shoulder adhesive capsulitis, chronic multifactorial, has had extensive surgery mastectomy and reconstruction  4. Neoplasm related pain, status post radiation treatment, currently having medication related joint pains as well, continue oxycodone 5 mg 4 times a day  5. Right upper extremity lymphedema post breast carcinoma, mastectomy, radiation therapy, continue pump, sleeve, elevation, may need some further  PT if this does not improve  Over half of the 25 min visit was spent counseling and coordinating care.we  discussed her various pain issues. Discussed treatments tried thus far and other potential beneficial treatments

## 2015-07-28 NOTE — Patient Instructions (Addendum)
diagnosed you with cervical dystonia.  As we discussed this can cause very tight muscles and limited range of motion in the neck.  Recommend Botox, schedule back next month for that

## 2015-07-28 NOTE — Progress Notes (Signed)
Subjective:    Patient ID: Karen Hopkins, female    DOB: 12-02-1975, 40 y.o.   MRN: [id number]  HPI   Pain Inventory Average Pain 7 Pain Right Now 6 My pain is sharp, burning, dull, stabbing, tingling and aching  In the last 24 hours, has pain interfered with the following? General activity 3 Relation with others 4 Enjoyment of life 4 What TIME of day is your pain at its worst? morning,daytime, evening Sleep (in general) Poor  Pain is worse with: walking, bending, inactivity, standing and some activites Pain improves with: rest, therapy/exercise, medication and injections Relief from Meds: 7  Mobility how many minutes can you walk? 10 ability to climb steps?  yes do you drive?  yes Do you have any goals in this area?  yes  Function disabled: date disabled . I need assistance with the following:  household duties  Neuro/Psych numbness tingling spasms dizziness  Prior Studies Any changes since last visit?  no  Physicians involved in your care Any changes since last visit?  no   Family History  Problem Relation Age of Onset  . Hypertension Mother   . Diabetes type II Father   . Prostate cancer Father    Social History   Social History  . Marital Status: Single    Spouse Name: N/A  . Number of Children: N/A  . Years of Education: N/A   Social History Main Topics  . Smoking status: Former Smoker -- 0.25 packs/day for 5 years    Quit date: 04/26/2010  . Smokeless tobacco: Never Used  . Alcohol Use: No  . Drug Use: No  . Sexual Activity: Not Currently    Birth Control/ Protection: Surgical   Other Topics Concern  . None   Social History Narrative   Past Surgical History  Procedure Laterality Date  . Supra-umbilical hernia  123456  . Nasal septum surgery    . Latissimus flap to breast Right 03/12/2013    Procedure: RIGHT BREAST LATISSIMUS FLAP WITH EXPANDER PLACEMENT;  Surgeon: Theodoro Kos, DO;  Location: [facility];  Service: Plastics;  Laterality:  Right;  . Cesarean section  07/15/2001; 03/18/2004; 11/21/2008  . Tubal ligation  11/21/2008  . Portacath placement Left 09/12/2009  . Modified radical mastectomy Right 03/11/2010  . Port-a-cath removal Left 12/01/2010  . Removal of tissue expander and placement of implant Right 08/23/2013    Procedure: REMOVAL RIGHT TISSUE EXPANDER AND PLACEMENT OF IMPLANT TO RIGHT BREAST ;  Surgeon: Theodoro Kos, DO;  Location: [facility];  Service: Plastics;  Laterality: Right;  . Breast reduction surgery Left 08/23/2013    Procedure: LEFT BREAST REDUCTION  ;  Surgeon: Theodoro Kos, DO;  Location: [facility];  Service: Plastics;  Laterality: Left;  . Liposuction Bilateral 08/23/2013    Procedure: LIPOSUCTION;  Surgeon: Theodoro Kos, DO;  Location: [facility];  Service: Plastics;  Laterality: Bilateral;   Past Medical History  Diagnosis Date  . Seasonal allergies   . Lymphedema of arm     right; no BP or puncture to right arm  . Depression   . Anxiety   . Sinus headache   . Hypertension     under control with med., has been on med. x 2 yr.  . History of breast cancer 2011    right  . History of chemotherapy 2011  . History of radiation therapy 2011  . Cancer (HCC)    BP 140/62 mmHg  Pulse 106  Resp 14  SpO2 98%  Opioid Risk Score:   Fall Risk Score:  `1  Depression screen PHQ 2/9  Depression screen [facility] 2/9 06/25/2015 03/04/2015 01/20/2015 01/07/2015 11/21/2014 10/07/2014 10/01/2014  Decreased Interest 0 0 0 0 0 0 0  Down, Depressed, Hopeless 0 0 0 0 0 0 0  PHQ - 2 Score 0 0 0 0 0 0 0  Altered sleeping - - - - - - -  Tired, decreased energy - - - - - - -  Change in appetite - - - - - - -  Feeling bad or failure about yourself  - - - - - - -  Trouble concentrating - - - - - - -  Moving slowly or fidgety/restless - - - - - - -  Suicidal thoughts - - - - - - -  PHQ-9 Score - - - - - - -     Review of Systems  Constitutional: Positive for diaphoresis and  unexpected weight change.  Gastrointestinal: Positive for constipation.  All other systems reviewed and are negative.      Objective:   Physical Exam        Assessment & Plan:

## 2015-07-29 ENCOUNTER — Ambulatory Visit: Payer: Medicare Other

## 2015-07-29 ENCOUNTER — Telehealth: Payer: Self-pay | Admitting: *Deleted

## 2015-07-29 NOTE — Telephone Encounter (Signed)
Called Kissie about missed appointment.  States she forgot.  Rescheduled to tomorrow at 9:00.  Aware of time and date.

## 2015-07-30 ENCOUNTER — Ambulatory Visit (HOSPITAL_BASED_OUTPATIENT_CLINIC_OR_DEPARTMENT_OTHER): Payer: Medicare Other

## 2015-07-30 VITALS — BP 149/90 | HR 103 | Temp 98.3°F

## 2015-07-30 DIAGNOSIS — Z5111 Encounter for antineoplastic chemotherapy: Secondary | ICD-10-CM | POA: Diagnosis not present

## 2015-07-30 DIAGNOSIS — C50011 Malignant neoplasm of nipple and areola, right female breast: Secondary | ICD-10-CM

## 2015-07-30 MED ORDER — GOSERELIN ACETATE 3.6 MG ~~LOC~~ IMPL
3.6000 mg | DRUG_IMPLANT | Freq: Once | SUBCUTANEOUS | Status: AC
  Administered 2015-07-30: 3.6 mg via SUBCUTANEOUS
  Filled 2015-07-30: qty 3.6

## 2015-08-04 ENCOUNTER — Telehealth: Payer: Self-pay | Admitting: Family Medicine

## 2015-08-04 ENCOUNTER — Ambulatory Visit (INDEPENDENT_AMBULATORY_CARE_PROVIDER_SITE_OTHER): Payer: Medicare Other | Admitting: Family Medicine

## 2015-08-04 ENCOUNTER — Encounter: Payer: Self-pay | Admitting: Family Medicine

## 2015-08-04 VITALS — BP 146/84 | HR 108 | Temp 98.3°F | Ht 64.0 in | Wt 269.5 lb

## 2015-08-04 DIAGNOSIS — L609 Nail disorder, unspecified: Secondary | ICD-10-CM | POA: Diagnosis not present

## 2015-08-04 DIAGNOSIS — R51 Headache: Secondary | ICD-10-CM | POA: Diagnosis present

## 2015-08-04 DIAGNOSIS — R600 Localized edema: Secondary | ICD-10-CM

## 2015-08-04 DIAGNOSIS — Q846 Other congenital malformations of nails: Secondary | ICD-10-CM

## 2015-08-04 DIAGNOSIS — R519 Headache, unspecified: Secondary | ICD-10-CM

## 2015-08-04 MED ORDER — FUROSEMIDE 20 MG PO TABS: 20.0000 mg | ORAL_TABLET | Freq: Every day | ORAL | Status: DC

## 2015-08-04 NOTE — Patient Instructions (Signed)
Getting MRI Referring to neurologist and foot doctor Sent in more fluid pills for you Follow up with me in 4-6 weeks  Be well, Dr. Ardelia Mems

## 2015-08-04 NOTE — Telephone Encounter (Signed)
Advised of appt by Kennyth Lose

## 2015-08-04 NOTE — Progress Notes (Signed)
Date of Visit: 08/04/2015   HPI:  Patient presents to discuss referral to neurologist for headaches. Was seen by her eye doctor, who recommended referral to neuro. I asked her to come in since we have not discussed headaches in the past. Reports headache occuring every other day. Located behind eyes. Associated with photo and phonophobia. Worse with lying down. Wakes up with headache. When has headache feels like she can't eat, sleep, or drink.   Swelling - needs refill on lasix. Blood pressure notably up multiple times now. Likely has actual hypertension. No chest pain or shortness of breath.   Toenail discoloration - toenails have recently become discolored. Would like to see podiatry  ROS: See HPI.  [place]: history of breast cancer, type 2 diabetes,   PHYSICAL EXAM: BP 146/84 mmHg  Pulse 108  Temp(Src) 98.3 F (36.8 C) (Oral)  Ht 5\' 4"  (1.626 m)  Wt 269 lb 8 oz (122.244 kg)  BMI 46.24 kg/m2 Gen: NAD, pleasant, cooperative HEENT: NCAT Heart: regular rate and rhythm, no murmur Lungs: clear to auscultation bilaterally, normal work of breathing  Neuro: cranial nerves II-XII tested and intact. Speech normal. Full strength bilat upper and lower ext. Normal FNF.  Ext: mild lower extremity edema bilaterally. Bilateral toenails are darkened and irregularly shaped  ASSESSMENT/PLAN:  Headache Neuro exam nonfocal today. With known history of malignancy and now chronic nearly daily headaches worse with position changes, warrants imaging. Will check MRI brain w contrast. Referral to neurology for evaluation as well.   Lower extremity edema Refill lasix today. Suspect she may have mild actual hypertension, review of BP's shows multiple mildly elevated #s. Will follow up in 4-6 weeks.   Toenail discoloration - refer to podiatry  FOLLOW UP: Follow up in 4-6 weeks with me for chronic medical problems Referral to neuro & podiatry  [place] J. Ardelia Mems, [place]

## 2015-08-04 NOTE — Telephone Encounter (Signed)
LMOVM. Pateint's MRI of brain has been scheduled for Thurs, 08/14/15 @ 12 PM at [facility]. She should arrive at 11:45 AM.

## 2015-08-07 ENCOUNTER — Other Ambulatory Visit: Payer: Self-pay | Admitting: Oncology

## 2015-08-07 ENCOUNTER — Ambulatory Visit: Payer: Medicare Other | Admitting: Podiatry

## 2015-08-07 ENCOUNTER — Other Ambulatory Visit: Payer: Self-pay | Admitting: Physical Medicine & Rehabilitation

## 2015-08-07 NOTE — Telephone Encounter (Signed)
Please advise on refill.

## 2015-08-08 NOTE — Assessment & Plan Note (Addendum)
Refill lasix today. Suspect she may have mild actual hypertension, review of BP's shows multiple mildly elevated #s. Will follow up in 4-6 weeks.

## 2015-08-08 NOTE — Assessment & Plan Note (Signed)
Neuro exam nonfocal today. With known history of malignancy and now chronic nearly daily headaches worse with position changes, warrants imaging. Will check MRI brain w contrast. Referral to neurology for evaluation as well.

## 2015-08-14 ENCOUNTER — Ambulatory Visit (HOSPITAL_COMMUNITY)
Admission: RE | Admit: 2015-08-14 | Discharge: 2015-08-14 | Disposition: A | Discharge status: Home / Self Care | Payer: Medicare Other | Source: Ambulatory Visit | Attending: Family Medicine | Admitting: Family Medicine

## 2015-08-14 DIAGNOSIS — G8929 Other chronic pain: Secondary | ICD-10-CM

## 2015-08-14 DIAGNOSIS — R51 Headache: Secondary | ICD-10-CM | POA: Insufficient documentation

## 2015-08-14 DIAGNOSIS — R9089 Other abnormal findings on diagnostic imaging of central nervous system: Secondary | ICD-10-CM | POA: Diagnosis not present

## 2015-08-14 LAB — POCT I-STAT CREATININE: Creatinine, Ser: 0.8 mg/dL (ref 0.44–1.00)

## 2015-08-14 MED ORDER — GADOBENATE DIMEGLUMINE 529 MG/ML IV SOLN
20.0000 mL | Freq: Once | INTRAVENOUS | Status: AC | PRN
  Administered 2015-08-14: 20 mL via INTRAVENOUS

## 2015-08-15 ENCOUNTER — Encounter: Payer: Self-pay | Admitting: Podiatry

## 2015-08-15 ENCOUNTER — Ambulatory Visit (INDEPENDENT_AMBULATORY_CARE_PROVIDER_SITE_OTHER): Payer: Medicare Other | Admitting: Podiatry

## 2015-08-15 VITALS — BP 125/80 | HR 108 | Resp 16

## 2015-08-15 DIAGNOSIS — B351 Tinea unguium: Secondary | ICD-10-CM

## 2015-08-15 NOTE — Progress Notes (Signed)
   Subjective:    Patient ID: Karen Hopkins, female    DOB: 1976/05/25, 39 y.o.   MRN: [id number]  HPI  The patient presents here today with B/ L toenails that discolored with the left great toenail lifting since 3 months.  Review of Systems  All other systems reviewed and are negative.      Objective:   Physical Exam GENERAL APPEARANCE: Alert, conversant. Appropriately groomed. No acute distress.  VASCULAR: Pedal pulses palpable at  [facility] and PT bilateral.  Capillary refill time is immediate to all digits,  Normal temperature gradient.  Digital hair growth is present bilateral  NEUROLOGIC: sensation is normal to 5.07 monofilament at 5/5 sites bilateral.  Light touch is intact bilateral, Muscle strength normal.  MUSCULOSKELETAL: acceptable muscle strength, tone and stability bilateral.  Intrinsic muscluature intact bilateral.  Rectus appearance of foot and digits noted bilateral. Functional hallux limitus 1st MPJ  Right greater than left.  DERMATOLOGIC: skin color, texture, and turgor are within normal limits.  No preulcerative lesions or ulcers  are seen, no interdigital maceration noted.  No open lesions present.  . No drainage noted.  NAILS  Thickened nail left hallux with unattachment to lateral border left hallux.  No redness or swelling.         Assessment & Plan:  Onychomycosis  B/L  Functional hallux limitus 1st MPJ Left greater than right  IE  Debride nail left hallux that is unattached.   Gardiner Barefoot DPM

## 2015-08-18 ENCOUNTER — Telehealth: Payer: Self-pay

## 2015-08-18 ENCOUNTER — Encounter: Payer: Self-pay | Admitting: Family Medicine

## 2015-08-18 ENCOUNTER — Ambulatory Visit (INDEPENDENT_AMBULATORY_CARE_PROVIDER_SITE_OTHER): Payer: Medicare Other | Admitting: Family Medicine

## 2015-08-18 ENCOUNTER — Ambulatory Visit: Payer: Medicare Other | Admitting: Physical Medicine & Rehabilitation

## 2015-08-18 ENCOUNTER — Ambulatory Visit: Payer: Medicare Other | Admitting: Neurology

## 2015-08-18 VITALS — BP 122/77 | HR 89 | Temp 98.6°F | Wt 261.3 lb

## 2015-08-18 DIAGNOSIS — B349 Viral infection, unspecified: Secondary | ICD-10-CM

## 2015-08-18 DIAGNOSIS — B9789 Other viral agents as the cause of diseases classified elsewhere: Secondary | ICD-10-CM

## 2015-08-18 DIAGNOSIS — J329 Chronic sinusitis, unspecified: Secondary | ICD-10-CM

## 2015-08-18 MED ORDER — BENZONATATE 100 MG PO CAPS: 200.0000 mg | ORAL_CAPSULE | Freq: Three times a day (TID) | ORAL | Status: DC | PRN

## 2015-08-18 NOTE — Patient Instructions (Signed)
Thanks for coming in today.   You likely have a viral infection.   If you develop ongoing fevers, feel worse, or feel short of breath, or if your symptoms do not resolve in the next 10 days. Then please return for follow up.   Thanks for letting us take care of you.   Sincerely, Paula Compton, MD Family Medicine -PGY 2   Sinusitis, Adult Sinusitis is redness, soreness, and inflammation of the paranasal sinuses. Paranasal sinuses are air pockets within the bones of your face. They are located beneath your eyes, in the middle of your forehead, and above your eyes. In healthy paranasal sinuses, mucus is able to drain out, and air is able to circulate through them by way of your nose. However, when your paranasal sinuses are inflamed, mucus and air can become trapped. This can allow bacteria and other germs to grow and cause infection. Sinusitis can develop quickly and last only a short time (acute) or continue over a long period (chronic). Sinusitis that lasts for more than 12 weeks is considered chronic. CAUSES Causes of sinusitis include:  Allergies.  Structural abnormalities, such as displacement of the cartilage that separates your nostrils (deviated septum), which can decrease the air flow through your nose and sinuses and affect sinus drainage.  Functional abnormalities, such as when the small hairs (cilia) that line your sinuses and help remove mucus do not work properly or are not present. SIGNS AND SYMPTOMS Symptoms of acute and chronic sinusitis are the same. The primary symptoms are pain and pressure around the affected sinuses. Other symptoms include:  Upper toothache.  Earache.  Headache.  Bad breath.  Decreased sense of smell and taste.  A cough, which worsens when you are lying flat.  Fatigue.  Fever.  Thick drainage from your nose, which often is green and may contain pus (purulent).  Swelling and warmth over the affected sinuses. DIAGNOSIS Your health  care provider will perform a physical exam. During your exam, your health care provider may perform any of the following to help determine if you have acute sinusitis or chronic sinusitis:  Look in your nose for signs of abnormal growths in your nostrils (nasal polyps).  Tap over the affected sinus to check for signs of infection.  View the inside of your sinuses using an imaging device that has a light attached (endoscope). If your health care provider suspects that you have chronic sinusitis, one or more of the following tests may be recommended:  Allergy tests.  Nasal culture. A sample of mucus is taken from your nose, sent to a lab, and screened for bacteria.  Nasal cytology. A sample of mucus is taken from your nose and examined by your health care provider to determine if your sinusitis is related to an allergy. TREATMENT Most cases of acute sinusitis are related to a viral infection and will resolve on their own within 10 days. Sometimes, medicines are prescribed to help relieve symptoms of both acute and chronic sinusitis. These may include pain medicines, decongestants, nasal steroid sprays, or saline sprays. However, for sinusitis related to a bacterial infection, your health care provider will prescribe antibiotic medicines. These are medicines that will help kill the bacteria causing the infection. Rarely, sinusitis is caused by a fungal infection. In these cases, your health care provider will prescribe antifungal medicine. For some cases of chronic sinusitis, surgery is needed. Generally, these are cases in which sinusitis recurs more than 3 times per year, despite other treatments. HOME CARE  INSTRUCTIONS  Drink plenty of water. Water helps thin the mucus so your sinuses can drain more easily.  Use a humidifier.  Inhale steam 3-4 times a day (for example, sit in the bathroom with the shower running).  Apply a warm, moist washcloth to your face 3-4 times a day, or as directed  by your health care provider.  Use saline nasal sprays to help moisten and clean your sinuses.  Take medicines only as directed by your health care provider.  If you were prescribed either an antibiotic or antifungal medicine, finish it all even if you start to feel better. SEEK IMMEDIATE MEDICAL CARE IF:  You have increasing pain or severe headaches.  You have nausea, vomiting, or drowsiness.  You have swelling around your face.  You have vision problems.  You have a stiff neck.  You have difficulty breathing.   This information is not intended to replace advice given to you by your health care provider. Make sure you discuss any questions you have with your health care provider.   Document Released: 05/24/2005 Document Revised: 06/14/2014 Document Reviewed: 06/08/2011 Elsevier Interactive Patient Education [employer].

## 2015-08-18 NOTE — Telephone Encounter (Signed)
Pt states she went to her PCP and was diagnosed with a viral infection. She would like to know if they could rx her some cough syrup with Hydrocodone in it. Please advise.

## 2015-08-18 NOTE — Telephone Encounter (Signed)
I would recommend cough syrup that does not have hydrocodone

## 2015-08-18 NOTE — Progress Notes (Signed)
Patient ID: Karen Hopkins, female   DOB: 01-31-1976, 40 y.o.   MRN: [id number]   [facility] Aquilla Hacker, MD Phone: [phone]  Subjective:   URI  Has been sick for 3 days. Nasal discharge: yes Medications tried: ibuprofen, alkaseltzer cold / flu, nyquil, tylenol Sick contacts: has young children at home.   Symptoms Fever: None.  Headache or face pain: Yes Tooth pain: None.  Sneezing: None.  Scratchy throat: Yes Allergies: None.  Muscle aches: Yes Severe fatigue Stiff neck: None.  Shortness of breath: none Rash: None Sore throat or swollen glands: None.   Mild cough, nonproductive.  No ear pain  No wheezing.   ROS see HPI Smoking Status noted    All relevant systems were reviewed and were negative unless otherwise noted in the HPI  Past Medical History Reviewed problem list.  Medications- reviewed and updated Current Outpatient Prescriptions  Medication Sig Dispense Refill  . ACCU-CHEK SMARTVIEW test strip USE TO CHECK BLOOD SUGAR LEVELS FOUR TIMES DAILY AS DIRECTED 100 each 3  . ALPRAZolam (XANAX) 1 MG tablet Take 1 tablet (1 mg total) by mouth 2 (two) times daily as needed for anxiety. 60 tablet 1  . anastrozole (ARIMIDEX) 1 MG tablet Take 1 tablet (1 mg total) by mouth daily. 30 tablet 4  . blood glucose meter kit and supplies KIT Dispense based on patient and insurance preference. Use up to four times daily as directed. (FOR ICD-9 250.00, 250.01). 1 each 0  . citalopram (CELEXA) 20 MG tablet TAKE ONE (1) TABLET BY MOUTH EVERY DAY 30 tablet 11  . fluticasone (FLONASE) 50 MCG/ACT nasal spray Place 2 sprays into both nostrils daily. 16 g 12  . furosemide (LASIX) 20 MG tablet Take 1 tablet (20 mg total) by mouth daily. 30 tablet 3  . goserelin (ZOLADEX) 3.6 MG injection Inject 3.6 mg into the skin every 28 (twenty-eight) days.    . metFORMIN (GLUCOPHAGE) 500 MG tablet Take 1 tablet (500 mg total) by mouth 2 (two) times daily with a  meal. 60 tablet 11  . naproxen (NAPROSYN) 500 MG tablet TAKE ONE (1) TABLET BY MOUTH 3 TIMES DAILY AS NEEDED FOR HEADACHE 90 tablet 1  . oxyCODONE (OXY IR/ROXICODONE) 5 MG immediate release tablet Take 1 tablet (5 mg total) by mouth every 6 (six) hours as needed for severe pain. 120 tablet 0  . polyethylene glycol powder (GLYCOLAX) powder Take 17 g by mouth daily. 500 g 5  . potassium chloride (MICRO-K) 10 MEQ CR capsule Take 2 capsules (20 mEq total) by mouth 2 (two) times daily. 180 capsule 3  . tiZANidine (ZANAFLEX) 4 MG tablet TAKE ONE (1) TABLET BY MOUTH 3 TIMES DAILY 90 tablet 3  . triamterene-hydrochlorothiazide (MAXZIDE-25) 37.5-25 MG tablet TAKE ONE (1) TABLET BY MOUTH EVERY DAY 30 tablet 4  . benzonatate (TESSALON) 100 MG capsule Take 2 capsules (200 mg total) by mouth 3 (three) times daily as needed for cough. 20 capsule 0   No current facility-administered medications for this visit.   Chief complaint-noted No additions to family history Social history- patient is a non smoker  Objective: BP 122/77 mmHg  Pulse 89  Temp(Src) 98.6 F (37 C) (Oral)  Wt 261 lb 4.8 oz (118.525 kg)  SpO2 97% Gen: NAD, alert, cooperative with exam HEENT: NCAT, EOMI, PERRL, TMs nml, O/P without  Erythema or tonsilar exudates.  Neck: FROM, supple, no LAD CV: RRR, good S1/S2, no murmur Resp: CTABL, no wheezes,  non-labored Abd: SNTND, BS present, no guarding or organomegaly Ext: No edema, warm, normal tone, moves UE/LE spontaneously Neuro: Alert and oriented, No gross deficits Skin: no rashes no lesions  Assessment/Plan:  # Viral URI - pt. With viral infection. No signs of bacterial infection at this time.  - Supportive care reviewed.  - Plenty of water. Use ibuprofen, Robitussin, Tylenol as needed, Mucinex as needed.  - Return precautions reveiwed, and patient agreeable to return if symptoms worsen or persist.  - F/U with pcp as needed.  - Handout given.

## 2015-08-19 ENCOUNTER — Encounter: Payer: Self-pay | Admitting: Neurology

## 2015-08-19 NOTE — Telephone Encounter (Signed)
Notified [place]

## 2015-08-25 ENCOUNTER — Encounter: Payer: Self-pay | Admitting: Physical Medicine & Rehabilitation

## 2015-08-25 ENCOUNTER — Encounter: Payer: Medicare Other | Attending: Registered Nurse

## 2015-08-25 ENCOUNTER — Ambulatory Visit (HOSPITAL_BASED_OUTPATIENT_CLINIC_OR_DEPARTMENT_OTHER): Payer: Medicare Other | Admitting: Physical Medicine & Rehabilitation

## 2015-08-25 VITALS — BP 158/99 | HR 108 | Resp 14

## 2015-08-25 DIAGNOSIS — Z79899 Other long term (current) drug therapy: Secondary | ICD-10-CM | POA: Diagnosis not present

## 2015-08-25 DIAGNOSIS — Z5181 Encounter for therapeutic drug level monitoring: Secondary | ICD-10-CM | POA: Diagnosis not present

## 2015-08-25 DIAGNOSIS — G894 Chronic pain syndrome: Secondary | ICD-10-CM | POA: Diagnosis not present

## 2015-08-25 DIAGNOSIS — M546 Pain in thoracic spine: Secondary | ICD-10-CM | POA: Diagnosis present

## 2015-08-25 DIAGNOSIS — G243 Spasmodic torticollis: Secondary | ICD-10-CM | POA: Diagnosis not present

## 2015-08-25 DIAGNOSIS — I890 Lymphedema, not elsewhere classified: Secondary | ICD-10-CM | POA: Insufficient documentation

## 2015-08-25 MED ORDER — OXYCODONE HCL 5 MG PO TABS: 5.0000 mg | ORAL_TABLET | Freq: Four times a day (QID) | ORAL | Status: DC | PRN

## 2015-08-25 NOTE — Patient Instructions (Signed)

## 2015-08-25 NOTE — Progress Notes (Signed)
Botox Injection for spasticity using needle EMG guidance  Dilution: 50 Units/ml Indication: Severe spasticity which interferes with ADL,mobility and/or  hygiene and is unresponsive to medication management and other conservative care Informed consent was obtained after describing risks and benefits of the procedure with the patient. This includes bleeding, bruising, infection, excessive weakness, or medication side effects. A REMS form is on file and signed. Needle: 30g 1" needle electrode Number of units per muscle 25 units trapezius 25 units levator 25 units sternocleidomastoid 12.5 units platysma 12.5 units scalene All injections were done after obtaining appropriate EMG activity and after negative drawback for blood. The patient tolerated the procedure well. Post procedure instructions were given. A followup appointment was made.

## 2015-08-26 ENCOUNTER — Ambulatory Visit: Payer: Medicare Other | Admitting: Neurology

## 2015-08-26 ENCOUNTER — Ambulatory Visit: Payer: Medicare Other

## 2015-08-29 LAB — TOXASSURE SELECT,+ANTIDEPR,UR: PDF: 0

## 2015-09-01 ENCOUNTER — Ambulatory Visit: Payer: Medicare Other | Admitting: Family Medicine

## 2015-09-01 ENCOUNTER — Other Ambulatory Visit: Payer: Self-pay | Admitting: Physical Medicine & Rehabilitation

## 2015-09-01 ENCOUNTER — Encounter: Payer: Self-pay | Admitting: Neurology

## 2015-09-01 ENCOUNTER — Ambulatory Visit (INDEPENDENT_AMBULATORY_CARE_PROVIDER_SITE_OTHER): Payer: Medicare Other | Admitting: Neurology

## 2015-09-01 VITALS — BP 118/88 | HR 100 | Ht 64.0 in | Wt 266.2 lb

## 2015-09-01 DIAGNOSIS — E236 Other disorders of pituitary gland: Secondary | ICD-10-CM

## 2015-09-01 DIAGNOSIS — R51 Headache: Secondary | ICD-10-CM

## 2015-09-01 DIAGNOSIS — R519 Headache, unspecified: Secondary | ICD-10-CM

## 2015-09-01 DIAGNOSIS — G932 Benign intracranial hypertension: Secondary | ICD-10-CM | POA: Diagnosis not present

## 2015-09-01 DIAGNOSIS — R7302 Impaired glucose tolerance (oral): Secondary | ICD-10-CM | POA: Diagnosis not present

## 2015-09-01 DIAGNOSIS — I480 Paroxysmal atrial fibrillation: Secondary | ICD-10-CM | POA: Diagnosis not present

## 2015-09-01 DIAGNOSIS — D6859 Other primary thrombophilia: Secondary | ICD-10-CM

## 2015-09-01 DIAGNOSIS — I639 Cerebral infarction, unspecified: Secondary | ICD-10-CM

## 2015-09-01 DIAGNOSIS — G4459 Other complicated headache syndrome: Secondary | ICD-10-CM

## 2015-09-01 DIAGNOSIS — H05119 Granuloma of unspecified orbit: Secondary | ICD-10-CM

## 2015-09-01 MED ORDER — TOPIRAMATE 50 MG PO TABS: 100.0000 mg | ORAL_TABLET | Freq: Every day | ORAL | Status: DC

## 2015-09-01 MED ORDER — METOCLOPRAMIDE HCL 10 MG PO TABS: 10.0000 mg | ORAL_TABLET | Freq: Three times a day (TID) | ORAL | Status: DC | PRN

## 2015-09-01 NOTE — Progress Notes (Addendum)
[facility]    Provider:  Dr Jaynee Eagles Referring Provider: [name], MD Primary Care Physician:  Chrisandra Netters, MD  CC:  Headache  Addendum: Echo cardiogram read: "- Global LV longitudinal strain -16.2% is mildly reduced and lower than on the previous study (-20%)". Discussed with Dr. Orene Desanctis, will refer to Dr. Haroldine Laws in Cardiology thanks.  HPI:  Karen Hopkins is a 40 y.o. female here as a referral from Dr. Ardelia Mems for intractable headaches. PMHx of right breast carcinoma, Status post modified radical mastectomy with postoperative radiation therapy In 2014, HTN, diabetes, migraine, depression, anxiety. She has had headaches for years. Since she was a teenager. Has to go into a dark room. The headache is worse with laying down when it is very bad. She has to sit very still. She wakes up with the headaches. Not taking tylenol, advil or naproxen daily no overuse headache. Headache starts on the right around the eye, throbbing, pulsing, and spreads to the other side of the head. She has nausea, no vomiting. She has photophobia, phonophobia. She wakes up with headaches. Snores a lot, kids tell her she excessively snores. She is very tired during the day. She also has cervical dystonia and just received botox. She has a headache several times a week and can last up to 3 days each. At least 15-20 headaches a month and at least 8 are migrainous. This frequency for over a year. Can be severe 10/10 pain. She can be excessively tired during the week but she had a sleep test for OSA neg 06/23/2011.  Reviewed notes, labs and imaging from outside physicians, which showed:   FINDINGS: No evidence for acute infarction, hemorrhage, mass lesion, hydrocephalus, or extra-axial fluid. Normal cerebral volume.  Subcentimeter focus of FLAIR hyperintensity, RIGHT frontal subcortical white matter, image 16, favored to represent nonspecific focus of ischemic demyelination, or possible  post treatment effect. Empty sella. No tonsillar herniation. No osseous lesion. Flow voids are maintained. No chronic hemorrhage.  Post infusion, no abnormal enhancement of brain or meninges.Extracranial soft tissues unremarkable. Shotty cervical adenopathy.  Compared with priors, it is possible that the RIGHT frontal subcortical lesion can be faintly visualized in retrospect. Certainly there is no acute abnormality in that region in 2014.  IMPRESSION: No evidence for metastatic disease to the brain or surrounding structures.  Subcentimeter focus of FLAIR hyperintensity RIGHT frontal subcortical white matter, nonspecific, see discussion above. No evidence for enhancement or restricted diffusion to suggest acuity. Suspect chronic focus of ischemic demyelination or post treatment effect.  CMP unremarkable 04/10/2015  Review of Systems: Patient complains of symptoms per HPI as well as the following symptoms: weight gain, weight loss, fatigue, eye paoin, swelling in legs, feeling hot, feeling cold, joint pain and swelling, constipation, aching muscles, allergies, memory loss, confusion, headache, dizzines, seizure, depression, anxiety, not enough sleep, decreased energy. Pertinent negatives per HPI. All others negative.   Social History   Social History  . Marital Status: Single    Spouse Name: N/A  . Number of Children: 3  . Years of Education: 16   Occupational History  . Disability     Social History Main Topics  . Smoking status: Former Smoker -- 0.25 packs/day for 5 years    Quit date: 04/26/2010  . Smokeless tobacco: Never Used  . Alcohol Use: No  . Drug Use: No  . Sexual Activity: Not Currently    Birth Control/ Protection: Surgical   Other Topics Concern  . Not on file  Social History Narrative   Lives with kids   Caffeine use: none     Family History  Problem Relation Age of Onset  . Hypertension Mother   . Diabetes type II Father   . Prostate cancer Father     . Stroke Neg Hx     Past Medical History  Diagnosis Date  . Seasonal allergies   . Lymphedema of arm     right; no BP or puncture to right arm  . Depression   . Anxiety   . Sinus headache   . Hypertension     under control with med., has been on med. x 2 yr.  . History of breast cancer 2011    right  . History of chemotherapy 2011  . History of radiation therapy 2011  . Cancer [facility])     Past Surgical History  Procedure Laterality Date  . Supra-umbilical hernia  5537  . Nasal septum surgery    . Latissimus flap to breast Right 03/12/2013    Procedure: RIGHT BREAST LATISSIMUS FLAP WITH EXPANDER PLACEMENT;  Surgeon: Theodoro Kos, DO;  Location: [place];  Service: Plastics;  Laterality: Right;  . Cesarean section  07/15/2001; 03/18/2004; 11/21/2008  . Tubal ligation  11/21/2008  . Portacath placement Left 09/12/2009  . Modified radical mastectomy Right 03/11/2010  . Port-a-cath removal Left 12/01/2010  . Removal of tissue expander and placement of implant Right 08/23/2013    Procedure: REMOVAL RIGHT TISSUE EXPANDER AND PLACEMENT OF IMPLANT TO RIGHT BREAST ;  Surgeon: Theodoro Kos, DO;  Location: [place];  Service: Plastics;  Laterality: Right;  . Breast reduction surgery Left 08/23/2013    Procedure: LEFT BREAST REDUCTION  ;  Surgeon: Theodoro Kos, DO;  Location: [place];  Service: Plastics;  Laterality: Left;  . Liposuction Bilateral 08/23/2013    Procedure: LIPOSUCTION;  Surgeon: Theodoro Kos, DO;  Location: [place];  Service: Plastics;  Laterality: Bilateral;    Current Outpatient Prescriptions  Medication Sig Dispense Refill  . ACCU-CHEK SMARTVIEW test strip USE TO CHECK BLOOD SUGAR LEVELS FOUR TIMES DAILY AS DIRECTED 100 each 3  . ALPRAZolam (XANAX) 1 MG tablet Take 1 tablet (1 mg total) by mouth 2 (two) times daily as needed for anxiety. 60 tablet 1  . anastrozole (ARIMIDEX) 1 MG tablet Take 1 tablet (1 mg total) by mouth  daily. 30 tablet 4  . blood glucose meter kit and supplies KIT Dispense based on patient and insurance preference. Use up to four times daily as directed. (FOR ICD-9 250.00, 250.01). 1 each 0  . citalopram (CELEXA) 20 MG tablet TAKE ONE (1) TABLET BY MOUTH EVERY DAY 30 tablet 11  . fluticasone (FLONASE) 50 MCG/ACT nasal spray Place 2 sprays into both nostrils daily. 16 g 12  . furosemide (LASIX) 20 MG tablet Take 1 tablet (20 mg total) by mouth daily. 30 tablet 3  . goserelin (ZOLADEX) 3.6 MG injection Inject 3.6 mg into the skin every 28 (twenty-eight) days.    . metFORMIN (GLUCOPHAGE) 500 MG tablet Take 1 tablet (500 mg total) by mouth 2 (two) times daily with a meal. 60 tablet 11  . oxyCODONE (OXY IR/ROXICODONE) 5 MG immediate release tablet Take 1 tablet (5 mg total) by mouth every 6 (six) hours as needed for severe pain. 120 tablet 0  . polyethylene glycol powder (GLYCOLAX) powder Take 17 g by mouth daily. 500 g 5  . potassium chloride (MICRO-K) 10 MEQ CR capsule Take  2 capsules (20 mEq total) by mouth 2 (two) times daily. 180 capsule 3  . tiZANidine (ZANAFLEX) 4 MG tablet TAKE ONE (1) TABLET BY MOUTH 3 TIMES DAILY 90 tablet 3  . triamterene-hydrochlorothiazide (MAXZIDE-25) 37.5-25 MG tablet TAKE ONE (1) TABLET BY MOUTH EVERY DAY 30 tablet 4  . metoCLOPramide (REGLAN) 10 MG tablet Take 1 tablet (10 mg total) by mouth every 8 (eight) hours as needed. 60 tablet 1  . naproxen (NAPROSYN) 500 MG tablet Take 1 tablet (500 mg total) by mouth 2 (two) times daily with a meal. 60 tablet 2  . topiramate (TOPAMAX) 50 MG tablet Take 2 tablets (100 mg total) by mouth at bedtime. 60 tablet 12   No current facility-administered medications for this visit.    Allergies as of 09/01/2015 - Review Complete 09/01/2015  Allergen Reaction Noted  . Penicillins Swelling 04/14/2010  . Lyrica [pregabalin] Nausea Only 01/19/2012  . Meloxicam Nausea Only 12/22/2011  . Robaxin [methocarbamol] Nausea Only 12/22/2011      Vitals: BP 118/88 mmHg  Pulse 100  Ht 5' 4"  (1.626 m)  Wt 266 lb 3.2 oz (120.748 kg)  BMI 45.67 kg/m2  SpO2 98% Last Weight:  Wt Readings from Last 1 Encounters:  09/01/15 266 lb 3.2 oz (120.748 kg)   Last Height:   Ht Readings from Last 1 Encounters:  09/01/15 5' 4"  (1.626 m)   Physical exam: Exam: Gen: NAD, conversant, well nourised, morbidly obese, well groomed                     CV: RRR, no MRG. No Carotid Bruits. No peripheral edema, warm, nontender Eyes: Conjunctivae clear without exudates or hemorrhage  Neuro: Detailed Neurologic Exam  Speech:    Speech is normal; fluent and spontaneous with normal comprehension.  Cognition:    The patient is oriented to [place], place, and time;     recent and remote memory intact;     language fluent;     normal attention, concentration,     fund of knowledge Cranial Nerves:    The pupils are equal, round, and reactive to light. The fundi are normal. Visual fields are full to finger confrontation. Extraocular movements are intact. Trigeminal sensation is intact and the muscles of mastication are normal. The face is symmetric. The palate elevates in the midline. Hearing intact. Voice is normal. Shoulder shrug is normal. The tongue has normal motion without fasciculations.   Coordination:    Normal finger to nose and heel to shin. Normal rapid alternating movements.   Gait:    Heel-toe and tandem gait are normal.   Motor Observation:    No asymmetry, no atrophy, and no involuntary movements noted. Tone:    Normal muscle tone.    Posture:    Posture is normal. normal erect    Strength:    Strength is V/V in the upper and lower limbs.      Sensation: intact to LT     Reflex Exam:  DTR's:    Deep tendon reflexes in the upper and lower extremities are normal bilaterally.   Toes:    The toes are downgoing bilaterally.   Clonus:    Clonus is absent.    Assessment/Plan:   40 y.o. female here as a referral from Dr.  Ardelia Mems for intractable headaches. PMHx of right breast carcinoma, Status post modified radical mastectomy with postoperative radiation therapy In 2014, HTN, diabetes, migraine, depression, anxiety. She has chronic migraines. MRI of the brain  showed a focus of FLAIR hyperintensity, right frontal subcortical white matter possibly a nonspecific focus of ischemic demyelination, or possible post treatment effect. Also seen was an Empty sella.  Needs additional workup for evaluation of any risk factors for ischemic stroke due to subcortical t2 hyperintensity that is non-specific but could be due to ischemia, location possibly embolic but could also be non-specific microvascular change, full stroke evaluation including Hypercoagulable panel,  MRA head, echocardiogram, heart monitor, carotids, lipid panel, hgba1c MRI orbit to further evaluate positional headache given empty sella on mri, evaluate for IIH Daily asa 24m Follow closely with pcp for management of vascular risk factors (ldl <70, normotensive BP, hgba1c < 7).  Echo cardiogram read: "- Global LV longitudinal strain -16.2% is mildly reduced and lower than on the previous study (-20%)". Discussed with Dr. COrene Desanctis will refer to Dr. BHaroldine Lawsthanks.  ASarina Ill MD  [facility] [address]STuscaroraGHaigler [place] [address] Phone [phone] [phone]

## 2015-09-01 NOTE — Patient Instructions (Signed)
Remember to drink plenty of fluid, eat healthy meals and do not skip any meals. Try to eat protein with a every meal and eat a healthy snack such as fruit or nuts in between meals. Try to keep a regular sleep-wake schedule and try to exercise daily, particularly in the form of walking, 20-30 minutes a day, if you can.   As far as your medications are concerned, I would like to suggest: Topiramate: week1 : 1/2 tab at night Week 2: 1 tab at night Week 3: 1.5 tab at night Week 4: 2 tabs at night  Reglan(metoclopramide) as needed for headache up to 3x a day.   As far as diagnostic testing: will order more labs, imaging of the brain and heart  I would like to see you back in 6 weeks, sooner if we need to. Please call us with any interim questions, concerns, problems, updates or refill requests.   Please also call us for any test results so we can go over those with you on the phone.  My clinical assistant and will answer any of your questions and relay your messages to me and also relay most of my messages to you.   Our phone number is [phone]. We also have an after hours call service for urgent matters and there is a physician on-call for urgent questions. For any emergencies you know to call 911 or go to the nearest emergency room

## 2015-09-02 ENCOUNTER — Other Ambulatory Visit: Payer: Self-pay

## 2015-09-02 ENCOUNTER — Encounter: Payer: Self-pay | Admitting: Neurology

## 2015-09-02 DIAGNOSIS — C50911 Malignant neoplasm of unspecified site of right female breast: Secondary | ICD-10-CM

## 2015-09-03 ENCOUNTER — Ambulatory Visit: Payer: Medicare Other | Admitting: Oncology

## 2015-09-03 ENCOUNTER — Other Ambulatory Visit: Payer: Medicare Other

## 2015-09-03 ENCOUNTER — Ambulatory Visit: Payer: Medicare Other

## 2015-09-03 ENCOUNTER — Telehealth: Payer: Self-pay | Admitting: Oncology

## 2015-09-03 NOTE — Telephone Encounter (Signed)
Patient called office to r/s appt due to not having her images done yet. Date/time per pt

## 2015-09-04 ENCOUNTER — Ambulatory Visit (HOSPITAL_BASED_OUTPATIENT_CLINIC_OR_DEPARTMENT_OTHER): Payer: Medicare Other

## 2015-09-04 ENCOUNTER — Other Ambulatory Visit: Payer: Self-pay | Admitting: *Deleted

## 2015-09-04 DIAGNOSIS — C50911 Malignant neoplasm of unspecified site of right female breast: Secondary | ICD-10-CM | POA: Diagnosis not present

## 2015-09-04 DIAGNOSIS — Z5111 Encounter for antineoplastic chemotherapy: Secondary | ICD-10-CM | POA: Diagnosis not present

## 2015-09-04 MED ORDER — GOSERELIN ACETATE 3.6 MG ~~LOC~~ IMPL
3.6000 mg | DRUG_IMPLANT | Freq: Once | SUBCUTANEOUS | Status: DC
  Administered 2015-09-04: 3.6 mg via SUBCUTANEOUS

## 2015-09-04 NOTE — Patient Instructions (Signed)
Goserelin injection What is this medicine? GOSERELIN (GOE se rel in) is similar to a hormone found in the body. It lowers the amount of sex hormones that the body makes. Men will have lower testosterone levels and women will have lower estrogen levels while taking this medicine. In men, this medicine is used to treat prostate cancer; the injection is either given once per month or once every 12 weeks. A once per month injection (only) is used to treat women with endometriosis, dysfunctional uterine bleeding, or advanced breast cancer. This medicine may be used for other purposes; ask your health care provider or pharmacist if you have questions. COMMON BRAND NAME(S): Zoladex What should I tell my health care provider before I take this medicine? They need to know if you have any of these conditions (some only apply to women): -diabetes -heart disease or previous heart attack -high blood pressure -high cholesterol -kidney disease -osteoporosis or low bone density -problems passing urine -spinal cord injury -stroke -tobacco smoker -an unusual or allergic reaction to goserelin, hormone therapy, other medicines, foods, dyes, or preservatives -pregnant or trying to get pregnant -breast-feeding How should I use this medicine? This medicine is for injection under the skin. It is given by a health care professional in a hospital or clinic setting. Men receive this injection once every 4 weeks or once every 12 weeks. Women will only receive the once every 4 weeks injection. Talk to your pediatrician regarding the use of this medicine in children. Special care may be needed. Overdosage: If you think you have taken too much of this medicine contact a poison control center or emergency room at once. NOTE: This medicine is only for you. Do not share this medicine with others. What if I miss a dose? It is important not to miss your dose. Call your doctor or health care professional if you are unable to  keep an appointment. What may interact with this medicine? -female hormones like estrogen -herbal or dietary supplements like black cohosh, chasteberry, or DHEA -female hormones like testosterone -prasterone This list may not describe all possible interactions. Give your health care provider a list of all the medicines, herbs, non-prescription drugs, or dietary supplements you use. Also tell them if you smoke, drink alcohol, or use illegal drugs. Some items may interact with your medicine. What should I watch for while using this medicine? Visit your doctor or health care professional for regular checks on your progress. Your symptoms may appear to get worse during the first weeks of this therapy. Tell your doctor or healthcare professional if your symptoms do not start to get better or if they get worse after this time. Your bones may get weaker if you take this medicine for a long time. If you smoke or frequently drink alcohol you may increase your risk of bone loss. A family history of osteoporosis, chronic use of drugs for seizures (convulsions), or corticosteroids can also increase your risk of bone loss. Talk to your doctor about how to keep your bones strong. This medicine should stop regular monthly menstration in women. Tell your doctor if you continue to menstrate. Women should not become pregnant while taking this medicine or for 12 weeks after stopping this medicine. Women should inform their doctor if they wish to become pregnant or think they might be pregnant. There is a potential for serious side effects to an unborn child. Talk to your health care professional or pharmacist for more information. Do not breast-feed an infant while taking   this medicine. Men should inform their doctors if they wish to father a child. This medicine may lower sperm counts. Talk to your health care professional or pharmacist for more information. What side effects may I notice from receiving this  medicine? Side effects that you should report to your doctor or health care professional as soon as possible: -allergic reactions like skin rash, itching or hives, swelling of the face, lips, or tongue -bone pain -breathing problems -changes in vision -chest pain -feeling faint or lightheaded, falls -fever, chills -pain, swelling, warmth in the leg -pain, tingling, numbness in the hands or feet -signs and symptoms of low blood pressure like dizziness; feeling faint or lightheaded, falls; unusually weak or tired -stomach pain -swelling of the ankles, feet, hands -trouble passing urine or change in the amount of urine -unusually high or low blood pressure -unusually weak or tired Side effects that usually do not require medical attention (report to your doctor or health care professional if they continue or are bothersome): -change in sex drive or performance -changes in breast size in both males and females -changes in emotions or moods -headache -hot flashes -irritation at site where injected -loss of appetite -skin problems like acne, dry skin -vaginal dryness This list may not describe all possible side effects. Call your doctor for medical advice about side effects. You may report side effects to FDA at 1-800-FDA-1088. Where should I keep my medicine? This drug is given in a hospital or clinic and will not be stored at home. NOTE: This sheet is a summary. It may not cover all possible information. If you have questions about this medicine, talk to your doctor, pharmacist, or health care provider.  2015, [employer]. (2013-07-31 11:10:35)  

## 2015-09-04 NOTE — Progress Notes (Signed)
Urine drug screen for this encounter is consistent for prescribed medication 

## 2015-09-11 ENCOUNTER — Ambulatory Visit
Admission: RE | Admit: 2015-09-11 | Discharge: 2015-09-11 | Disposition: A | Discharge status: Home / Self Care | Payer: Medicare Other | Source: Ambulatory Visit | Attending: Neurology | Admitting: Neurology

## 2015-09-11 ENCOUNTER — Ambulatory Visit (INDEPENDENT_AMBULATORY_CARE_PROVIDER_SITE_OTHER): Payer: Medicare Other

## 2015-09-11 DIAGNOSIS — I639 Cerebral infarction, unspecified: Secondary | ICD-10-CM

## 2015-09-11 DIAGNOSIS — G4459 Other complicated headache syndrome: Secondary | ICD-10-CM

## 2015-09-11 DIAGNOSIS — E236 Other disorders of pituitary gland: Secondary | ICD-10-CM

## 2015-09-11 DIAGNOSIS — G932 Benign intracranial hypertension: Secondary | ICD-10-CM

## 2015-09-11 DIAGNOSIS — R519 Headache, unspecified: Secondary | ICD-10-CM

## 2015-09-11 DIAGNOSIS — H05119 Granuloma of unspecified orbit: Secondary | ICD-10-CM

## 2015-09-11 DIAGNOSIS — D6859 Other primary thrombophilia: Secondary | ICD-10-CM

## 2015-09-11 DIAGNOSIS — R51 Headache: Secondary | ICD-10-CM

## 2015-09-11 MED ORDER — GADOBENATE DIMEGLUMINE 529 MG/ML IV SOLN
20.0000 mL | Freq: Once | INTRAVENOUS | Status: AC | PRN
  Administered 2015-09-11: 20 mL via INTRAVENOUS

## 2015-09-15 ENCOUNTER — Other Ambulatory Visit: Payer: Self-pay | Admitting: Neurology

## 2015-09-15 DIAGNOSIS — I639 Cerebral infarction, unspecified: Secondary | ICD-10-CM

## 2015-09-15 DIAGNOSIS — D763 Other histiocytosis syndromes: Secondary | ICD-10-CM

## 2015-09-15 DIAGNOSIS — I4891 Unspecified atrial fibrillation: Secondary | ICD-10-CM

## 2015-09-15 DIAGNOSIS — D869 Sarcoidosis, unspecified: Secondary | ICD-10-CM

## 2015-09-16 ENCOUNTER — Telehealth: Payer: Self-pay | Admitting: Neurology

## 2015-09-16 ENCOUNTER — Other Ambulatory Visit: Payer: Self-pay | Admitting: Oncology

## 2015-09-16 ENCOUNTER — Ambulatory Visit (INDEPENDENT_AMBULATORY_CARE_PROVIDER_SITE_OTHER): Payer: Medicare Other

## 2015-09-16 DIAGNOSIS — I639 Cerebral infarction, unspecified: Secondary | ICD-10-CM

## 2015-09-16 DIAGNOSIS — I4891 Unspecified atrial fibrillation: Secondary | ICD-10-CM

## 2015-09-16 NOTE — Telephone Encounter (Signed)
-----   Message from Melvenia Beam, MD sent at 09/16/2015  5:22 PM EDT ----- Carotids normal thanks

## 2015-09-16 NOTE — Telephone Encounter (Signed)
Spoke to patient regarding ophthalmology referral for finding below. Was going to refer her to dr. Midge Aver. After speaking with patient, Appears she has seen Dr. Collene Gobble in the past [phone]), I was not aware and do not have this in my notes or have records. I will call and discuss with Dr. Rosana Hoes the findings below and see if she would like to eval patient instead otherwise will refer to Dr. Katy Fitch. Discussed with patient. Patient will also come in for bloodwork ordered. Patient is wearing the heart monitor currently. Pending echo this Friday. She reports that she had her carotid dopplers completed. I don;t see those results. Hinton Dyer, did she have this done in our office? If so, do we have results yet? Pending workup and discussion with ophthalmology (can we have lacrimal glands biopsied?) will consider lumbar puncture. Thanks.   FINDINGS: The globes appear normal. Extraocular muscles are normal in size with homogenous enhancement. Lacrimal glands are enlarged bilaterally, slightly more on the left. Superior ophthalmic veins are mildly enlarged bilaterally, measuring 4 mm on the left and 3 mm on the right (less than 2.5 mm is normal). The optic nerves are normal bilaterally. The pituitary gland appears normal. The cavernous sinuses appear normal. Paranasal sinuses appear normal. The visible brain appears normal.   IMPRESSION: This MRI of the orbits with and without contrast shows the following: 1. Enlarged lacrimal glands bilaterally. Although this is a nonspecific finding, a differential diagnosis includes orbital pseudotumor, sarcoidosis, histiocytosis, lymphoma/leukemia and thyroid eye disease.  2. Mild enlargement of the superior ophthalmic veins is likely related to the finding above and can be seen with orbital pseudotumor, thyroid eye disease and raised intracranial pressure.

## 2015-09-16 NOTE — Telephone Encounter (Signed)
Dr. Jaynee Eagles Dopplers are scanned in under Media Tab. Seth Bake Just scanned In today. Thanks Hinton Dyer

## 2015-09-16 NOTE — Telephone Encounter (Signed)
Tried calling pt. She picked up but could not hear me on other line. She hung up. I called back and went to VM. I LVM LVM for pt about normal carotid doppler per Dr Jaynee Eagles.

## 2015-09-16 NOTE — Telephone Encounter (Signed)
Cheryl/Dr. Herbert Deaner [phone], returned Dr. Cathren Laine call, tried to reach Dr. Jaynee Eagles by cell phone, call won't go through, call can't be picked up and no way to leave voicemail.

## 2015-09-17 ENCOUNTER — Telehealth: Payer: Self-pay | Admitting: Neurology

## 2015-09-17 ENCOUNTER — Other Ambulatory Visit (INDEPENDENT_AMBULATORY_CARE_PROVIDER_SITE_OTHER): Payer: Self-pay

## 2015-09-17 DIAGNOSIS — D763 Other histiocytosis syndromes: Secondary | ICD-10-CM

## 2015-09-17 DIAGNOSIS — I480 Paroxysmal atrial fibrillation: Secondary | ICD-10-CM

## 2015-09-17 DIAGNOSIS — R519 Headache, unspecified: Secondary | ICD-10-CM

## 2015-09-17 DIAGNOSIS — I639 Cerebral infarction, unspecified: Secondary | ICD-10-CM

## 2015-09-17 DIAGNOSIS — G4459 Other complicated headache syndrome: Secondary | ICD-10-CM

## 2015-09-17 DIAGNOSIS — R7302 Impaired glucose tolerance (oral): Secondary | ICD-10-CM

## 2015-09-17 DIAGNOSIS — Z0289 Encounter for other administrative examinations: Secondary | ICD-10-CM

## 2015-09-17 DIAGNOSIS — E236 Other disorders of pituitary gland: Secondary | ICD-10-CM

## 2015-09-17 DIAGNOSIS — G932 Benign intracranial hypertension: Secondary | ICD-10-CM

## 2015-09-17 DIAGNOSIS — D6859 Other primary thrombophilia: Secondary | ICD-10-CM

## 2015-09-17 DIAGNOSIS — H05119 Granuloma of unspecified orbit: Secondary | ICD-10-CM

## 2015-09-17 DIAGNOSIS — R51 Headache: Secondary | ICD-10-CM

## 2015-09-17 DIAGNOSIS — D869 Sarcoidosis, unspecified: Secondary | ICD-10-CM

## 2015-09-17 NOTE — Telephone Encounter (Signed)
Called [facility] at [phone] and relayed on voice mail to please send Patient's MRI disc. to attention Malachy Mood / Dr. Leticia Clas.  [address]  [place] [address].  Relayed to medical records to please call me back when message received.

## 2015-09-17 NOTE — Telephone Encounter (Signed)
Karen Hopkins called back from [place] and relayed she will get MRI CD sent.

## 2015-09-17 NOTE — Telephone Encounter (Signed)
Spoke to Dr. Leticia Clas. Patient has vision field testing next month, she will move this appointment up. She did not notice any eyelid swelling or proptosis but I discussed the findings on mri of the orbits with her and possible lacrimal gland biopsy, possible orbital pseudotumor. We will touch base when labs are back and after visual field testing. May need to send to [facility] to neuro-ophthalmology. Will hold on LP for now until serum labs and ophthalmology follow up.  Hinton Dyer - Any way we can get the imaging on a disk sent to Dr. Leticia Clas please at [facility] ophthalmology? They would like to review those images. Please let me know if you can get this taken care of, thanks.

## 2015-09-17 NOTE — Telephone Encounter (Signed)
Called and spoke to [place] at Dr. Rosana Hoes office. Dr. Rosana Hoes did have the wrong number . Relayed to [place] the correct number [phone] and Misha relayed I am walking back to Dr. Rosana Hoes now to Call Dr. Jaynee Eagles. Dr. Jaynee Eagles is aware of details.

## 2015-09-17 NOTE — Telephone Encounter (Signed)
Karen Hopkins, can you help me contact this office please? There is no option here to connect to an extension so not sure why she left that(can you try?), there is NO option for a doctor's office calling, I have tried every option and can't get anyone on the phone, and there is no reason why Dr. Herbert Deaner should not be able to get through on my cell phone. Can you try reaching this office for me? Maybe I missed the secret option to dial an extension? They probably got my cell phone wrong. It is [phone]. It is NOT a 336 area code. I just tried from my home number, my cell work s. Any way you can try reaching this office so I can try and help my patient? Give them my cell again, thanks.  Thank you, anything you can do would be awesome.  Karen Hopkins

## 2015-09-17 NOTE — Telephone Encounter (Signed)
Dr. Lanell Matar [phone] called back, number she was given for Dr. Jaynee Eagles was incorrect, has called correct number for Dr. Jaynee Eagles and left a message.

## 2015-09-18 ENCOUNTER — Telehealth: Payer: Self-pay | Admitting: *Deleted

## 2015-09-18 ENCOUNTER — Other Ambulatory Visit (HOSPITAL_BASED_OUTPATIENT_CLINIC_OR_DEPARTMENT_OTHER): Payer: Medicare Other

## 2015-09-18 ENCOUNTER — Ambulatory Visit (HOSPITAL_BASED_OUTPATIENT_CLINIC_OR_DEPARTMENT_OTHER): Payer: Medicare Other | Admitting: Oncology

## 2015-09-18 ENCOUNTER — Telehealth: Payer: Self-pay | Admitting: Oncology

## 2015-09-18 VITALS — BP 137/97 | HR 85 | Temp 98.0°F | Resp 18 | Ht 64.0 in | Wt 256.7 lb

## 2015-09-18 DIAGNOSIS — F418 Other specified anxiety disorders: Secondary | ICD-10-CM

## 2015-09-18 DIAGNOSIS — C50911 Malignant neoplasm of unspecified site of right female breast: Secondary | ICD-10-CM

## 2015-09-18 DIAGNOSIS — C50411 Malignant neoplasm of upper-outer quadrant of right female breast: Secondary | ICD-10-CM

## 2015-09-18 DIAGNOSIS — H04039 Chronic enlargement of unspecified lacrimal gland: Secondary | ICD-10-CM

## 2015-09-18 DIAGNOSIS — E119 Type 2 diabetes mellitus without complications: Secondary | ICD-10-CM | POA: Diagnosis not present

## 2015-09-18 DIAGNOSIS — C50811 Malignant neoplasm of overlapping sites of right female breast: Secondary | ICD-10-CM

## 2015-09-18 LAB — COMPREHENSIVE METABOLIC PANEL
ALT: 19 U/L (ref 0–55)
ANION GAP: 11 meq/L (ref 3–11)
AST: 16 U/L (ref 5–34)
Albumin: 4 g/dL (ref 3.5–5.0)
Alkaline Phosphatase: 85 U/L (ref 40–150)
BUN: 13 mg/dL (ref 7.0–26.0)
CO2: 25 meq/L (ref 22–29)
CREATININE: 0.9 mg/dL (ref 0.6–1.1)
Calcium: 10 mg/dL (ref 8.4–10.4)
Chloride: 102 mEq/L (ref 98–109)
GLUCOSE: 106 mg/dL (ref 70–140)
POTASSIUM: 3 meq/L — AB (ref 3.5–5.1)
Sodium: 138 mEq/L (ref 136–145)
Total Protein: 8.4 g/dL — ABNORMAL HIGH (ref 6.4–8.3)

## 2015-09-18 LAB — CBC WITH DIFFERENTIAL/PLATELET
BASO%: 0.2 % (ref 0.0–2.0)
BASOS ABS: 0 10*3/uL (ref 0.0–0.1)
EOS ABS: 0.5 10*3/uL (ref 0.0–0.5)
EOS%: 4.8 % (ref 0.0–7.0)
HCT: 37.7 % (ref 34.8–46.6)
HGB: 12.8 g/dL (ref 11.6–15.9)
LYMPH#: 3.3 10*3/uL (ref 0.9–3.3)
LYMPH%: 33.7 % (ref 14.0–49.7)
MCH: 28.4 pg (ref 25.1–34.0)
MCHC: 34 g/dL (ref 31.5–36.0)
MCV: 83.6 fL (ref 79.5–101.0)
MONO#: 0.5 10*3/uL (ref 0.1–0.9)
MONO%: 4.8 % (ref 0.0–14.0)
NEUT#: 5.5 10*3/uL (ref 1.5–6.5)
NEUT%: 56.5 % (ref 38.4–76.8)
PLATELETS: 340 10*3/uL (ref 145–400)
RBC: 4.51 10*6/uL (ref 3.70–5.45)
RDW: 14.5 % (ref 11.2–14.5)
WBC: 9.8 10*3/uL (ref 3.9–10.3)

## 2015-09-18 MED ORDER — ANASTROZOLE 1 MG PO TABS: 1.0000 mg | ORAL_TABLET | Freq: Every day | ORAL | Status: DC

## 2015-09-18 MED ORDER — LORAZEPAM 0.5 MG PO TABS: 0.5000 mg | ORAL_TABLET | Freq: Every day | ORAL | Status: DC | PRN

## 2015-09-18 NOTE — Telephone Encounter (Signed)
appt made and avs printed °

## 2015-09-18 NOTE — Progress Notes (Signed)
ID: Lil Lepage Walthall   DOB: 04/29/1976  MR#: [id number]  JHE#:[id number]  PCP: Chrisandra Netters, MD GYN: SU: Osborn Coho, MD;  Theodoro Kos, MD OTHER MD: Alysia Penna, MD;  Melrose Nakayama, MD  CHIEF COMPLAINT:  Locally advanced right breast cancer  CURRENT TREATMENT: Tamoxifen; goserelin  HISTORY OF PRESENT ILLNESS: From the original intake note:  The patient noted swelling and redness, as well as some tenderness, over her right breast for some time. She thought this might be related to pregnancy, since she delivered her third child about 8 months ago. As the problem did not improve, she brought it to her primary care physician's attention. He treated her with Bactrim, which she says she could not tolerate because of nausea and vomiting, but which in any case did not resolve the problem, so he set her up for mammography at [facility] 08/26/2009. Dr. Sadie Haber found by exam marked skin thickening and erythema of the right breast extending pretty much throughout the breast. By mammography there were scattered fibroglandular densities, and an asymmetric density in the right upper outer quadrant with suspicious microcalcifications. There were also enlarged right axillary lymph nodes. The left appeared normal. By ultrasound there was an ill defined area of hypoechoic tissue in the right upper outer quadrant that was difficult to measure. There were also abnormal right axillary lymph nodes.  Ultrasound guided biopsy was performed the same day, and showed [phone]) a poorly differentiated invasive ductal carcinoma, which was estrogen receptor poor at 3%, progesterone receptor positive at 14% with an elevated proliferation marker at 50%, but importantly with strong amplification of HER-2 by CISH with a ratio of 5.20.  Bilateral breast MRIs were obtained 09/02/2009. This showed an area up to 10.6 cm in the right breast showing confluent mass and non-mass enhancement. There was marked skin  thickening involving the entire right breast, and numerous bulky right axillary lymph nodes were identified. The left breast was unremarkable, and there was no evidence of internal mammary lymph node involvement.  Patient was treated in the neoadjuvant setting with 4 dose dense cycles of doxorubicin and cyclophosphamide, followed by 12 weekly doses of paclitaxel and trastuzumab. Ryann underwent definitive right modified radical mastectomy in October 2011, with a residual 2.5 mm area of tumor in the breast, grade 3, and one of 21 lymph nodes involved.   Her subsequent history is as detailed below  INTERVAL HISTORY: Terisha returns today for follow-up of her triple positive breast cancer. Since her last visit here she has developed severe recurrent headaches which prompted a very thorough and still ongoing workup. So far there is no evidence of metastatic disease. She has enlarged lacrimal glands I do not think is going to be related. She seems to be responding to treatment for migraines.  She continues on goserelin. She gets very emotional before each dose. She feels like she is having a period. She's also on anastrozole. She says the symptoms are very similar to those she had with tamoxifen but not quite as bad.  REVIEW OF SYSTEMS: Mariadelosang sleeps poorly. She is severely fatigued. She hurts particularly in her neck and shoulder and has been told that she has impingement syndrome. The pain is stabbing , throbbing, aching, and both intermittent and constant. She has some change in her vision. She needs new contacts. She has significant sinus problems. Her ankles swell. She feels short of breath when walking up stairs. Her appetite is poor. She is very anxious and depressed. She thinks her diabetes  is doing "okay". A detailed review of systems today was otherwise stable .  PAST MEDICAL HISTORY: Past Medical History  Diagnosis Date  . Seasonal allergies   . Lymphedema of arm     right; no BP or puncture  to right arm  . Depression   . Anxiety   . Sinus headache   . Hypertension     under control with med., has been on med. x 2 yr.  . History of breast cancer 2011    right  . History of chemotherapy 2011  . History of radiation therapy 2011  . Cancer [facility])     PAST SURGICAL HISTORY: Past Surgical History  Procedure Laterality Date  . Supra-umbilical hernia  [id number]  . Nasal septum surgery    . Latissimus flap to breast Right 03/12/2013    Procedure: RIGHT BREAST LATISSIMUS FLAP WITH EXPANDER PLACEMENT;  Surgeon: Theodoro Kos, DO;  Location: [place];  Service: Plastics;  Laterality: Right;  . Cesarean section  07/15/2001; 03/18/2004; 11/21/2008  . Tubal ligation  11/21/2008  . Portacath placement Left 09/12/2009  . Modified radical mastectomy Right 03/11/2010  . Port-a-cath removal Left 12/01/2010  . Removal of tissue expander and placement of implant Right 08/23/2013    Procedure: REMOVAL RIGHT TISSUE EXPANDER AND PLACEMENT OF IMPLANT TO RIGHT BREAST ;  Surgeon: Theodoro Kos, DO;  Location: [place];  Service: Plastics;  Laterality: Right;  . Breast reduction surgery Left 08/23/2013    Procedure: LEFT BREAST REDUCTION  ;  Surgeon: Theodoro Kos, DO;  Location: [place];  Service: Plastics;  Laterality: Left;  . Liposuction Bilateral 08/23/2013    Procedure: LIPOSUCTION;  Surgeon: Theodoro Kos, DO;  Location: [place];  Service: Plastics;  Laterality: Bilateral;    FAMILY HISTORY Family History  Problem Relation Age of Onset  . Hypertension Mother   . Diabetes type II Father   . Prostate cancer Father   . Stroke Neg Hx   There is no history of breast or ovarian cancer in the immediate family, but of the patient's mother's mother's six sisters, two (the patient's great-aunts) had ovarian cancer.  GYNECOLOGIC HISTORY:   (Updated 11/26/2013) The patient is GX, P3. First child was premature. Age at first delivery, 39 years old.   she status  post tubal ligation. She continues to have menstrual cycles, although irregularly.  SOCIAL HISTORY: (Updated 11/26/2013) She is currently disabled. Vaidehi's children are [name], and [name]. They are 11, 10 and 5 as of DEC 2015. They are all boys, all at home. The patient attends a non-denominational church in [place], which she considers her home, but she is currently living in [place].   ADVANCED DIRECTIVES: not in place  HEALTH MAINTENANCE:  (Updated 11/26/2013) Social History  Substance Use Topics  . Smoking status: Former Smoker -- 0.25 packs/day for 5 years    Quit date: 04/26/2010  . Smokeless tobacco: Never Used  . Alcohol Use: No    Colonoscopy: Never  PAP: April 2015  Bone density: Never  Lipid panel: August 2014    Allergies  Allergen Reactions  . Penicillins Swelling    FACIAL SWELLING  . Lyrica [Pregabalin] Nausea Only    .  Marland Kitchen Meloxicam Nausea Only  . Robaxin [Methocarbamol] Nausea Only    Current Outpatient Prescriptions  Medication Sig Dispense Refill  . ACCU-CHEK SMARTVIEW test strip USE TO CHECK BLOOD SUGAR LEVELS FOUR TIMES DAILY AS DIRECTED 100 each 3  . ALPRAZolam Duanne Moron)  1 MG tablet Take 1 tablet (1 mg total) by mouth 2 (two) times daily as needed for anxiety. 60 tablet 1  . anastrozole (ARIMIDEX) 1 MG tablet Take 1 tablet (1 mg total) by mouth daily. 30 tablet 4  . blood glucose meter kit and supplies KIT Dispense based on patient and insurance preference. Use up to four times daily as directed. (FOR ICD-9 250.00, 250.01). 1 each 0  . citalopram (CELEXA) 20 MG tablet TAKE ONE (1) TABLET BY MOUTH EVERY DAY 30 tablet 11  . fluticasone (FLONASE) 50 MCG/ACT nasal spray Place 2 sprays into both nostrils daily. 16 g 12  . furosemide (LASIX) 20 MG tablet Take 1 tablet (20 mg total) by mouth daily. 30 tablet 3  . goserelin (ZOLADEX) 3.6 MG injection Inject 3.6 mg into the skin every 28 (twenty-eight) days.    . metFORMIN (GLUCOPHAGE) 500 MG tablet  Take 1 tablet (500 mg total) by mouth 2 (two) times daily with a meal. 60 tablet 11  . metoCLOPramide (REGLAN) 10 MG tablet Take 1 tablet (10 mg total) by mouth every 8 (eight) hours as needed. 60 tablet 1  . naproxen (NAPROSYN) 500 MG tablet Take 1 tablet (500 mg total) by mouth 2 (two) times daily with a meal. 60 tablet 2  . oxyCODONE (OXY IR/ROXICODONE) 5 MG immediate release tablet Take 1 tablet (5 mg total) by mouth every 6 (six) hours as needed for severe pain. 120 tablet 0  . polyethylene glycol powder (GLYCOLAX) powder Take 17 g by mouth daily. 500 g 5  . potassium chloride (MICRO-K) 10 MEQ CR capsule Take 2 capsules (20 mEq total) by mouth 2 (two) times daily. 180 capsule 3  . tiZANidine (ZANAFLEX) 4 MG tablet TAKE ONE (1) TABLET BY MOUTH 3 TIMES DAILY 90 tablet 3  . topiramate (TOPAMAX) 50 MG tablet Take 2 tablets (100 mg total) by mouth at bedtime. 60 tablet 12  . triamterene-hydrochlorothiazide (MAXZIDE-25) 37.5-25 MG tablet TAKE ONE (1) TABLET BY MOUTH EVERY DAY 30 tablet 4   No current facility-administered medications for this visit.    OBJECTIVE: Young African American woman Who appears stated age 19 Vitals:   09/18/15 1124  BP: 137/97  Pulse: 85  Temp: 98 F (36.7 C)  Resp: 18     Body mass index is 44.04 kg/(m^2).    ECOG FS: 2   Filed Weights   09/18/15 1124  Weight: 256 lb 11.2 oz (116.438 kg)   Sclerae unicteric, EOMs intact Oropharynx clear, no thrush or other lesions No cervical or supraclavicular adenopathy Lungs no rales or rhonchi Heart regular rate and rhythm Abd soft, obese, nontender, positive bowel sounds MSK tender to palpation lower thoracic spine Neuro: nonfocal, well oriented, anxious affect Breasts: The right breast is status post mastectomy and reconstruction. There is no evidence of local recurrence. The right axilla is benign. The left breast is unremarkable  LAB RESULTS: Lab Results  Component Value Date   WBC 9.8 09/18/2015    NEUTROABS 5.5 09/18/2015   HGB 12.8 09/18/2015   HCT 37.7 09/18/2015   MCV 83.6 09/18/2015   PLT 340 09/18/2015      Chemistry      Component Value Date/Time   NA 142 04/10/2015 1239   NA 138 10/02/2014 0854   K 3.3* 04/10/2015 1239   K 4.0 10/02/2014 0854   CL 103 10/02/2014 0854   CL 105 09/27/2012 1044   CO2 28 04/10/2015 1239   CO2 25 10/02/2014 0854  BUN 13.7 04/10/2015 1239   BUN 13 10/02/2014 0854   CREATININE 0.80 08/14/2015 1209   CREATININE 0.7 04/10/2015 1239   CREATININE 0.59 10/02/2014 0854      Component Value Date/Time   CALCIUM 9.3 04/10/2015 1239   CALCIUM 9.1 10/02/2014 0854   ALKPHOS 75 04/10/2015 1239   ALKPHOS 65 11/24/2011 0903   AST 15 04/10/2015 1239   AST 13 11/24/2011 0903   ALT 21 04/10/2015 1239   ALT 13 11/24/2011 0903   BILITOT <0.30 04/10/2015 1239   BILITOT 0.1* 11/24/2011 0903     STUDIES: Mr Jodene Nam Head Wo Contrast  09/12/2015   [facility] [address] [place], [place] [address] [phone] NEUROIMAGING REPORT STUDY DATE: 09/11/2015 PATIENT NAME: Miara Emminger Asleson DOB: 1976-01-06 MRN: [id number] EXAM: MR angiogram of the intracranial arteries ORDERING CLINICIAN: Sarina Ill M.D. CLINICAL HISTORY: 40 year old woman with refractory headaches COMPARISON FILMS: None TECHNIQUE: MR angiogram of the head was obtained utilizing 3D time of flight sequences from below the vertebrobasilar junction up to the intracranial vasculature without contrast.  Computerized reconstructions were obtained. CONTRAST: None IMAGING SITE: [facility], [place], [place], [place] FINDINGS: The imaged extracranial and intracranial portions of the internal carotid arteries appear normal. The middle cerebral and anterior cerebral arteries appear normal. In the posterior circulation, the right vertebral artery is dominant. No stenosis is noted within the vertebral arteries and the basilar arteries. The posterior cerebral arteries appear  normal. No aneurysms were identified.   09/12/2015   This is a normal MR angiogram of the intracranial arteries INTERPRETING PHYSICIAN: Richard A. Felecia Shelling, MD, PhD Certified in  Neuroimaging by [place] [facility] Neuro[facility]   Mr Darnelle Catalan Wo/w Cm  09/12/2015   [facility] [address] [place],  [address] [phone] NEUROIMAGING REPORT STUDY DATE: 09/11/2015 PATIENT NAME: Linley Moskal Madkins DOB: 1975/10/28 MRN: [id number] EXAM: MRI of the orbits with and without contrast ORDERING CLINICIAN: Sarina Ill M.D. CLINICAL HISTORY: 40 year old woman with intractable headaches, empty sella on prior MRI being evaluated for orbital pseudotumor COMPARISON FILMS: MRI 08/25/2015 TECHNIQUE: MRI orbits with and without contrast obtained utilizing 3 mm thin cut slices in the axial and coronal planes with T1, T2, fat-saturated and postcontrast views. CONTRAST: 17 mL MultiHance IMAGING SITE: [facility], [place], [place], [place] FINDINGS: The globes appear normal. Extraocular muscles are normal in size with homogenous enhancement. Lacrimal glands are enlarged bilaterally, slightly more on the left.  Superior ophthalmic veins are mildly enlarged bilaterally, measuring 4 mm on the left and 3 mm on the right (less than 2.5 mm is normal).   The optic nerves are normal bilaterally. The pituitary gland appears normal. The cavernous sinuses appear normal. Paranasal sinuses appear normal. The visible brain appears normal.   09/12/2015   This MRI of the orbits with and without contrast shows the following: 1.   Enlarged lacrimal glands bilaterally.   Although this is a nonspecific finding, a differential diagnosis includes orbital pseudotumor, sarcoidosis, histiocytosis, lymphoma/leukemia and thyroid eye disease.    2.   Mild enlargement of the superior ophthalmic veins is likely related to the finding above and can be seen with orbital pseudotumor, thyroid eye disease and raised intracranial  pressure.   INTERPRETING PHYSICIAN: Richard A. Felecia Shelling, MD, PhD Certified in  [place] by [place] [facility] Neuroimaging         ASSESSMENT: 40 y.o.  BRCA1 and BRCA2 negative [place] woman, status post right breast biopsy in March  2011 for a high-grade invasive ductal carcinoma, ER positive 3%, PR positive 14%, strongly HER2/neu positive with MIB-1 of 15%. Biopsy proven lymph node involvement at presentation. Changes consistent with inflammatory carcinoma.   1. Treated neoadjuvantly, status post 4 dose-dense cycles of doxorubicin/cyclophosphamide, followed by 12 weekly doses of paclitaxel with trastuzumab completed September 2011.   2. Trastuzumab was then continued for a total of 1 year [to June 2012]. Post-treatment echo showed a well-preserved ejection fraction.   3. Status post right modified radical mastectomy in October 2011 showing a ypT1a ypN1 invasive ductal carcinoma, grade 3.  Status post right reconstruction with implant, March 2015   4. postmastectomy radiation completed in January 2012  5. on tamoxifen beginning January of 2012 stopped somewhere in Spring/Summer 2016  (a) anastrozole started November 2016.  6. other problems include chronic postsurgical pain, chronic right upper extremity lymphedema, and chronic depression  7. Goserelin started 09/24/2014, to be repeated every 4 weeks  PLAN: Ahnesti is now 5 and hip years out from her definitive surgery for breast cancer with no evidence of recurrence. This is very favorable   she is having some trouble tolerating the anastrozole but all in all finds it little easier than tamoxifen. I strongly urged her to continue to a total of 10 years, since her cancer was very advanced at diagnosis and there was still residual disease at the time of her definitive surgery. We are also continuing the goserelin monthly.  It appears that the reason for her headaches is migraines and she seems to be responding to the Topamax. She has  had a very thorough workup with no evidence of metastatic disease as the cause. She will continue follow-up with neurology and of course through the pain clinic.  Today I gave her an image of what lacrimal glands looked like and where they lie in the head so she could understand the findings a little bit better. I don't have an interpretation of that finding for her and I am not sure it is related to her migraine symptoms.  She more or less forgot to have her mammogram last month. I went ahead and put that in   the order to be done next available.  She was doing very well on long acting alprazolam but then they stopped pain for that and she has had a rough time with the short acting alprazolam. Today I'm switching her to lorazepam 0.5 mg and she can take that once a day. If a half a pill does not quite do it she will take 2 pills. She will let us know how that works.  We will see her again in 3 months. She knows to call for any problems that may develop before her next visit here. Chauncey Cruel, MD  09/18/2015

## 2015-09-18 NOTE — Telephone Encounter (Signed)
This RN spoke with MD per noted low potassium reported per lab draw today.  Pt states she is not taking more then 2 tablets of the micro K ( 10 mEq ) a day due to stomach pain occurring post taking.

## 2015-09-19 ENCOUNTER — Other Ambulatory Visit: Payer: Self-pay

## 2015-09-19 ENCOUNTER — Ambulatory Visit: Payer: Medicare Other

## 2015-09-19 ENCOUNTER — Telehealth: Payer: Self-pay | Admitting: *Deleted

## 2015-09-19 ENCOUNTER — Other Ambulatory Visit: Payer: Self-pay | Admitting: *Deleted

## 2015-09-19 ENCOUNTER — Other Ambulatory Visit: Payer: Self-pay | Admitting: Oncology

## 2015-09-19 ENCOUNTER — Ambulatory Visit (HOSPITAL_COMMUNITY): Payer: Medicare Other | Attending: Cardiovascular Disease

## 2015-09-19 DIAGNOSIS — I10 Essential (primary) hypertension: Secondary | ICD-10-CM | POA: Insufficient documentation

## 2015-09-19 DIAGNOSIS — Z9011 Acquired absence of right breast and nipple: Secondary | ICD-10-CM

## 2015-09-19 DIAGNOSIS — Z923 Personal history of irradiation: Secondary | ICD-10-CM | POA: Insufficient documentation

## 2015-09-19 DIAGNOSIS — Z87891 Personal history of nicotine dependence: Secondary | ICD-10-CM | POA: Insufficient documentation

## 2015-09-19 DIAGNOSIS — C50911 Malignant neoplasm of unspecified site of right female breast: Secondary | ICD-10-CM | POA: Diagnosis not present

## 2015-09-19 DIAGNOSIS — I639 Cerebral infarction, unspecified: Secondary | ICD-10-CM | POA: Insufficient documentation

## 2015-09-19 DIAGNOSIS — I480 Paroxysmal atrial fibrillation: Secondary | ICD-10-CM | POA: Insufficient documentation

## 2015-09-19 DIAGNOSIS — Z9221 Personal history of antineoplastic chemotherapy: Secondary | ICD-10-CM | POA: Insufficient documentation

## 2015-09-19 DIAGNOSIS — Z1231 Encounter for screening mammogram for malignant neoplasm of breast: Secondary | ICD-10-CM

## 2015-09-19 NOTE — Telephone Encounter (Signed)
This RN spoke with Alenda who stated she has not felt good since initiating the ativan instead of the xanax. She states she is very anxious, having episodes of crying " just not good at all ". Pt's voice was very shaky - and not in good composure.   She is able to take potassium 10 mEg qid at present.  Plan per above is to restart xanax.  This RN has sent prior authorization for xanax xr to pt's insurance which is what has worked the best in controlling her anxiety.  This RN called prescription ot pt's pharmacy.

## 2015-09-22 ENCOUNTER — Encounter: Payer: Self-pay | Admitting: Physical Medicine & Rehabilitation

## 2015-09-22 ENCOUNTER — Encounter: Payer: Medicare Other | Attending: Registered Nurse

## 2015-09-22 ENCOUNTER — Ambulatory Visit (HOSPITAL_BASED_OUTPATIENT_CLINIC_OR_DEPARTMENT_OTHER): Payer: Medicare Other | Admitting: Physical Medicine & Rehabilitation

## 2015-09-22 VITALS — BP 113/64 | HR 89 | Resp 14 | Wt 255.0 lb

## 2015-09-22 DIAGNOSIS — G893 Neoplasm related pain (acute) (chronic): Secondary | ICD-10-CM | POA: Diagnosis not present

## 2015-09-22 DIAGNOSIS — I639 Cerebral infarction, unspecified: Secondary | ICD-10-CM | POA: Diagnosis not present

## 2015-09-22 DIAGNOSIS — G243 Spasmodic torticollis: Secondary | ICD-10-CM | POA: Diagnosis not present

## 2015-09-22 DIAGNOSIS — I890 Lymphedema, not elsewhere classified: Secondary | ICD-10-CM | POA: Insufficient documentation

## 2015-09-22 DIAGNOSIS — M546 Pain in thoracic spine: Secondary | ICD-10-CM | POA: Insufficient documentation

## 2015-09-22 MED ORDER — OXYCODONE HCL 5 MG PO TABS: 5.0000 mg | ORAL_TABLET | Freq: Four times a day (QID) | ORAL | Status: DC | PRN

## 2015-09-22 NOTE — Progress Notes (Signed)
Subjective:    Patient ID: Karen Hopkins, female    DOB: 07-11-1975, 40 y.o.   MRN: [id number]  HPI Botox for cervical dystonia performed approximately 6 weeks ago 25 units trapezius 25 units levator 25 units sternocleidomastoid 12.5 units platysma 12.5 units scalene  Patient still feels like her right sternocleidomastoid muscle is prominent but does notice some improved range of motion and some mildly reduced pain.  Pain Inventory Average Pain 8 Pain Right Now 8 My pain is stabbing  In the last 24 hours, has pain interfered with the following? General activity too tired to fill out Relation with others too tired to fill out Enjoyment of life too tired to fill out What TIME of day is your pain at its worst? too tired to fill out Sleep (in general) too tired to fill out  Pain is worse with: too tired to fill out Pain improves with: too tired to fill out Relief from Meds: too tired to fill out  Mobility walk without assistance  Function disabled: date disabled .  Neuro/Psych No problems in this area  Prior Studies too tired to fill out  Physicians involved in your care too tired to fill out   Family History  Problem Relation Age of Onset  . Hypertension Mother   . Diabetes type II Father   . Prostate cancer Father   . Stroke Neg Hx    Social History   Social History  . Marital Status: Single    Spouse Name: N/A  . Number of Children: 3  . Years of Education: 16   Occupational History  . Disability     Social History Main Topics  . Smoking status: Former Smoker -- 0.25 packs/day for 5 years    Quit date: 04/26/2010  . Smokeless tobacco: Never Used  . Alcohol Use: No  . Drug Use: No  . Sexual Activity: Not Currently    Birth Control/ Protection: Surgical   Other Topics Concern  . None   Social History Narrative   Lives with kids   Caffeine use: none    Past Surgical History  Procedure Laterality Date  . Supra-umbilical hernia  [id number]  .  Nasal septum surgery    . Latissimus flap to breast Right 03/12/2013    Procedure: RIGHT BREAST LATISSIMUS FLAP WITH EXPANDER PLACEMENT;  Surgeon: Theodoro Kos, DO;  Location: [place];  Service: Plastics;  Laterality: Right;  . Cesarean section  07/15/2001; 03/18/2004; 11/21/2008  . Tubal ligation  11/21/2008  . Portacath placement Left 09/12/2009  . Modified radical mastectomy Right 03/11/2010  . Port-a-cath removal Left 12/01/2010  . Removal of tissue expander and placement of implant Right 08/23/2013    Procedure: REMOVAL RIGHT TISSUE EXPANDER AND PLACEMENT OF IMPLANT TO RIGHT BREAST ;  Surgeon: Theodoro Kos, DO;  Location: [place];  Service: Plastics;  Laterality: Right;  . Breast reduction surgery Left 08/23/2013    Procedure: LEFT BREAST REDUCTION  ;  Surgeon: Theodoro Kos, DO;  Location: [place];  Service: Plastics;  Laterality: Left;  . Liposuction Bilateral 08/23/2013    Procedure: LIPOSUCTION;  Surgeon: Theodoro Kos, DO;  Location: [place];  Service: Plastics;  Laterality: Bilateral;   Past Medical History  Diagnosis Date  . Seasonal allergies   . Lymphedema of arm     right; no BP or puncture to right arm  . Depression   . Anxiety   . Sinus headache   . Hypertension  under control with med., has been on med. x 2 yr.  . History of breast cancer 2011    right  . History of chemotherapy 2011  . History of radiation therapy 2011  . Cancer (HCC)    BP 113/64 mmHg  Pulse 89  Resp 14  Wt 255 lb (115.667 kg)  SpO2 97%  Opioid Risk Score:   Fall Risk Score:  `1  Depression screen PHQ 2/9  Depression screen [facility] 2/9 08/04/2015 06/25/2015 03/04/2015 01/20/2015 01/07/2015 11/21/2014 10/07/2014  Decreased Interest 0 0 0 0 0 0 0  Down, Depressed, Hopeless 0 0 0 0 0 0 0  PHQ - 2 Score 0 0 0 0 0 0 0  Altered sleeping - - - - - - -  Tired, decreased energy - - - - - - -  Change in appetite - - - - - - -  Feeling bad or failure about  yourself  - - - - - - -  Trouble concentrating - - - - - - -  Moving slowly or fidgety/restless - - - - - - -  Suicidal thoughts - - - - - - -  PHQ-9 Score - - - - - - -     Review of Systems  All other systems reviewed and are negative.      Objective:   Physical Exam  Constitutional: She is oriented to [place], place, and time. She appears well-developed and well-nourished.  HENT:  Head: Normocephalic.  Neurological: She is alert and oriented to [place], place, and time.  Prominent right sternocleidomastoid.  Cervical range of motion however is 75% lateral bending to the right and to the left as well as 75% rotation to the right into the left  Mild tenderness to palpation sternocleidomastoid as well as levator scapula  Psychiatric: She has a normal mood and affect.  Nursing note and vitals reviewed.         Assessment & Plan:  1. History of cervical pain, neoplastic related pain in the right shoulder girdle and pectoral area. She has had some relief with oxycodone will continue current dose 5 mg 4 times a day  2. Cervical dystonia partial relief with botulinum toxin injection. Would increase the next treatment with following doses  50 units trapezius 50 units levator 75 units sternocleidomastoid 12.5 units platysma 12.5 units scalene

## 2015-09-22 NOTE — Patient Instructions (Addendum)
If the Botox has been truly helpful,  should notice some worsening of neck stiffness and pain in the next 2-3 weeks.

## 2015-09-24 ENCOUNTER — Telehealth: Payer: Self-pay | Admitting: *Deleted

## 2015-09-24 DIAGNOSIS — R931 Abnormal findings on diagnostic imaging of heart and coronary circulation: Secondary | ICD-10-CM

## 2015-09-24 LAB — LIPID PANEL
CHOLESTEROL TOTAL: 100 mg/dL (ref 100–199)
Chol/HDL Ratio: 4.2 ratio units (ref 0.0–4.4)
HDL: 24 mg/dL — ABNORMAL LOW (ref >39)
LDL Calculated: 55 mg/dL (ref 0–99)
Triglycerides: 105 mg/dL (ref 0–149)
VLDL Cholesterol Cal: 21 mg/dL (ref 5–40)

## 2015-09-24 LAB — HEMOGLOBINOPATHY EVALUATION
HEMOGLOBIN A2 QUANTITATION: 2.2 % (ref 0.7–3.1)
HGB A: 97.8 % (ref 94.0–98.0)
HGB C: 0 %
HGB S: 0 %
Hemoglobin F Quantitation: 0 % (ref 0.0–2.0)

## 2015-09-24 LAB — CBC WITH DIFFERENTIAL/PLATELET
BASOS ABS: 0 10*3/uL (ref 0.0–0.2)
Basos: 0 %
EOS (ABSOLUTE): 0.5 10*3/uL — ABNORMAL HIGH (ref 0.0–0.4)
EOS: 5 %
HEMATOCRIT: 40.6 % (ref 34.0–46.6)
HEMOGLOBIN: 13.3 g/dL (ref 11.1–15.9)
IMMATURE GRANULOCYTES: 0 %
Immature Grans (Abs): 0 10*3/uL (ref 0.0–0.1)
LYMPHS: 42 %
Lymphocytes Absolute: 3.9 10*3/uL — ABNORMAL HIGH (ref 0.7–3.1)
MCH: 27.9 pg (ref 26.6–33.0)
MCHC: 32.8 g/dL (ref 31.5–35.7)
MCV: 85 fL (ref 79–97)
MONOCYTES: 4 %
Monocytes Absolute: 0.4 10*3/uL (ref 0.1–0.9)
Neutrophils Absolute: 4.5 10*3/uL (ref 1.4–7.0)
Neutrophils: 49 %
Platelets: 360 10*3/uL (ref 150–379)
RBC: 4.77 x10E6/uL (ref 3.77–5.28)
RDW: 15.4 % (ref 12.3–15.4)
WBC: 9.4 10*3/uL (ref 3.4–10.8)

## 2015-09-24 LAB — HOMOCYSTEINE: Homocysteine: 13.8 umol/L (ref 0.0–15.0)

## 2015-09-24 LAB — PROTEIN C ACTIVITY: PROTEIN C ACTIVITY: 151 % (ref 73–180)

## 2015-09-24 LAB — PAN-ANCA
Atypical pANCA: <1:20 {titer}
P-ANCA: <1:20 {titer}

## 2015-09-24 LAB — THYROID PANEL WITH TSH
FREE THYROXINE INDEX: 2.1 (ref 1.2–4.9)
T3 UPTAKE RATIO: 32 % (ref 24–39)
T4, Total: 6.5 ug/dL (ref 4.5–12.0)
TSH: 1.39 u[IU]/mL (ref 0.450–4.500)

## 2015-09-24 LAB — FACTOR V LEIDEN

## 2015-09-24 LAB — PROTEIN S ACTIVITY: PROTEIN S ACTIVITY: 157 % — AB (ref 63–140)

## 2015-09-24 LAB — LUPUS ANTICOAGULANT PANEL
Dilute Viper Venom Time: 38.6 s (ref 0.0–44.0)
PTT LA: 32.9 s (ref 0.0–43.6)

## 2015-09-24 LAB — BETA-2-GLYCOPROTEIN I ABS, IGG/M/A
Beta-2 Glyco 1 IgM: <9 GPI IgM units (ref 0–32)
Beta-2 Glyco I IgG: <9 GPI IgG units (ref 0–20)

## 2015-09-24 LAB — PROTEIN C, TOTAL: Protein C Antigen: 121 % (ref 60–150)

## 2015-09-24 LAB — ANGIOTENSIN CONVERTING ENZYME: ANGIO CONVERT ENZYME: 40 U/L (ref 14–82)

## 2015-09-24 LAB — CARDIOLIPIN ANTIBODIES, IGG, IGM, IGA

## 2015-09-24 LAB — HEMOGLOBIN A1C
ESTIMATED AVERAGE GLUCOSE: 137 mg/dL
HEMOGLOBIN A1C: 6.4 % — AB (ref 4.8–5.6)

## 2015-09-24 LAB — PROTEIN S, TOTAL: PROTEIN S AG TOTAL: 162 % — AB (ref 60–150)

## 2015-09-24 LAB — ANTITHROMBIN III: AntiThromb III Func: 112 % (ref 75–135)

## 2015-09-24 LAB — HIV ANTIBODY (ROUTINE TESTING W REFLEX): HIV SCREEN 4TH GENERATION: NONREACTIVE

## 2015-09-24 LAB — PROTHROMBIN GENE MUTATION

## 2015-09-24 LAB — C3 AND C4
Complement C3, Serum: 190 mg/dL — ABNORMAL HIGH (ref 82–167)
Complement C4, Serum: 30 mg/dL (ref 14–44)

## 2015-09-24 LAB — SICKLE CELL SCREEN: SICKLE CELL SCREEN: NEGATIVE

## 2015-09-24 LAB — COMPLEMENT, TOTAL

## 2015-09-24 LAB — RPR: RPR: NONREACTIVE

## 2015-09-24 LAB — SEDIMENTATION RATE: SED RATE: 51 mm/h — AB (ref 0–32)

## 2015-09-24 NOTE — Telephone Encounter (Addendum)
Pt called office back. Went over [facility] results per Dr Jaynee Eagles note. She verbalized understanding. We will forward results to her PCP. Verified PCP on file.

## 2015-09-24 NOTE — Telephone Encounter (Signed)
Pt returned Kristen's call °

## 2015-09-24 NOTE — Telephone Encounter (Signed)
Pt called office. She is worried about abnormal findings on ECHO. She requested to speak to Dr Jaynee Eagles. I advised she is seeing pt until 430pm or after. I will give her the message to call her. In the meantime, she should call PCP to set up appt to f/u. She would like a referral to cardiology instead of seeing her PCP about abnormal ECHO instead. She does not want to got to PCP about this. She states "Dr Jaynee Eagles is the only doctor that really cares. I do not want to see my PCP about this". Advised I have to check with Dr Jaynee Eagles first. She verbalized understanding.

## 2015-09-24 NOTE — Telephone Encounter (Signed)
LVM for pt to call about echo/lab results. Gave [facility] phone number.

## 2015-09-24 NOTE — Telephone Encounter (Signed)
-----   Message from Melvenia Beam, MD sent at 09/23/2015  2:56 PM EDT ----- Labs unremarkable so far except a hgba1c of 6.4 which is considered diabetes. She should follow up with primary care regarding this. Still pending a few labs thanks. I also spoke to her ophthalmologist regarding her enlarged lacrimal glands and they are going to refer her to another specialist. I need her to come see me afterwards. thanks.

## 2015-09-24 NOTE — Telephone Encounter (Signed)
LVM for pt to call about results. Gave [facility] phone number.  

## 2015-09-25 ENCOUNTER — Other Ambulatory Visit: Payer: Self-pay | Admitting: Nurse Practitioner

## 2015-09-25 ENCOUNTER — Other Ambulatory Visit: Payer: Self-pay

## 2015-09-25 ENCOUNTER — Encounter: Payer: Self-pay | Admitting: Nurse Practitioner

## 2015-09-25 DIAGNOSIS — E876 Hypokalemia: Secondary | ICD-10-CM

## 2015-09-25 DIAGNOSIS — C50911 Malignant neoplasm of unspecified site of right female breast: Secondary | ICD-10-CM

## 2015-09-25 MED ORDER — POTASSIUM CHLORIDE CRYS ER 20 MEQ PO TBCR: 20.0000 meq | EXTENDED_RELEASE_TABLET | Freq: Two times a day (BID) | ORAL | Status: DC

## 2015-09-25 NOTE — Progress Notes (Signed)
Left for heather to sign

## 2015-09-25 NOTE — Telephone Encounter (Addendum)
Let patient know I will email the cardiologist who read her echo. I'll see what they say and get back to her. I just need clarification.I just don't know what the following comment means "- Global LV longitudinal strain -16.2% is mildly reduced and lower  than on the previous study (-20%)".  thanks

## 2015-09-25 NOTE — Telephone Encounter (Signed)
Terrence Dupont, Dr. Orene Desanctis responded below. I will refer her to the cardiologist he recommends below thanks Please let her know thanks.   Per Dr. Orene Desanctis: The Global LV longitudinal strain is an imprtant metric. "This metric is important for early detection of cardiotoxicity from certain chemotherapy agents. Please refer her to Dr. Haroldine Laws in the [place] (this does not mean she has heart failure, but means we may need to reconsider the choice of chemo). Her oncologist is also a good resource for advice for her Hope that helps,

## 2015-09-26 ENCOUNTER — Telehealth: Payer: Self-pay

## 2015-09-26 NOTE — Telephone Encounter (Signed)
Patient called twice- first time regarding her insurance questioning potassium prescription - Probation officer spke with [facility] and they were able to fill and insurance covered it. The second message was questioning whether she had an appt to have a potassium blood draw.  Writer LVM for patient to return the call however she did not have a lab scheduled today.

## 2015-09-26 NOTE — Telephone Encounter (Addendum)
Called pt back. Relayed Dr Ferdinand Lango message RE Dr Orene Desanctis response. Advised we will refer her to Dr Dr. Haroldine Laws in the [place]. If she does not hear back within a week or two to schedule, she is to call me so we can f/u on it.  She verbalized understanding.  Pt states she is not currently receiving any chemotherapy. She was treated for breast cancer about 3 years ago. She wanted to make sure Dr Jaynee Eagles was aware she is going to see Dr Toy Cookey on 10/15/15 for a biopsy on her eyes. Advised I will give her the message.

## 2015-09-26 NOTE — Telephone Encounter (Signed)
Pt returned Emma's call °

## 2015-09-26 NOTE — Telephone Encounter (Signed)
Called pt and LVM for pt to call back about results. Gave [facility] phone number.

## 2015-09-27 ENCOUNTER — Other Ambulatory Visit: Payer: Self-pay | Admitting: Neurology

## 2015-09-27 DIAGNOSIS — H4710 Unspecified papilledema: Secondary | ICD-10-CM

## 2015-09-27 DIAGNOSIS — G08 Intracranial and intraspinal phlebitis and thrombophlebitis: Secondary | ICD-10-CM

## 2015-09-27 DIAGNOSIS — G4489 Other headache syndrome: Secondary | ICD-10-CM

## 2015-09-27 DIAGNOSIS — H539 Unspecified visual disturbance: Secondary | ICD-10-CM

## 2015-09-27 NOTE — Telephone Encounter (Signed)
Thank you ! Ask her to follow up with me after she gets the results from the biopsy. I also think we should get something called an MRV of the brain to examine the veins. Ophthalmology recommended this scan (I spoke to her ophthalmologist) and I agree this is a good idea to rule out every possibility. This scan examines the veins in the brain to ensure there is no clot or narrowing which can cause some of her symptoms. I know we are ordering a lot, but we really want to find the cause of her symptoms. After the biopsy and the MRV, we may need to perform a lumbar puncture pending results. I already spoke to her about that. Thank you !

## 2015-09-29 ENCOUNTER — Other Ambulatory Visit: Payer: Self-pay | Admitting: Oncology

## 2015-09-29 NOTE — Telephone Encounter (Signed)
LVM for pt to call. I returned her call.

## 2015-09-29 NOTE — Telephone Encounter (Signed)
Called and relayed message per Dr Jaynee Eagles note below. Told her to call Dr Toy Cookey office to find out if she is having actual biopsy on 5/10 and how long results take to come back. We can then r/s her f/u from 10/14/15 so that it is after biopsy have been done. She stated she has already scheduled [facility] for 10/06/15. She is going to call me back to r/s her follow-up.

## 2015-09-29 NOTE — Telephone Encounter (Signed)
Dr Jaynee Eagles- FYI Called pt back. She stated she r/s appt with Dr Toy Cookey to this Wed (10/01/15) because the initial appt for for eval. She will find out after she goes when she will have the biopsy. She will keep appt with Dr Jaynee Eagles on 10/14/15. She will call back if she needs to r.s.

## 2015-09-29 NOTE — Telephone Encounter (Signed)
Patient returned Emma's call ° °

## 2015-09-29 NOTE — Telephone Encounter (Signed)
Patient returned Karen Hopkins's call, please call [phone].

## 2015-10-02 ENCOUNTER — Other Ambulatory Visit: Payer: Self-pay | Admitting: *Deleted

## 2015-10-02 ENCOUNTER — Ambulatory Visit (HOSPITAL_BASED_OUTPATIENT_CLINIC_OR_DEPARTMENT_OTHER): Payer: Medicare Other

## 2015-10-02 ENCOUNTER — Other Ambulatory Visit: Payer: Self-pay | Admitting: Oncology

## 2015-10-02 ENCOUNTER — Other Ambulatory Visit (HOSPITAL_BASED_OUTPATIENT_CLINIC_OR_DEPARTMENT_OTHER): Payer: Medicare Other

## 2015-10-02 VITALS — BP 130/76 | HR 89 | Temp 97.9°F

## 2015-10-02 DIAGNOSIS — C50811 Malignant neoplasm of overlapping sites of right female breast: Secondary | ICD-10-CM

## 2015-10-02 DIAGNOSIS — E876 Hypokalemia: Secondary | ICD-10-CM

## 2015-10-02 DIAGNOSIS — Z5111 Encounter for antineoplastic chemotherapy: Secondary | ICD-10-CM

## 2015-10-02 DIAGNOSIS — C50411 Malignant neoplasm of upper-outer quadrant of right female breast: Secondary | ICD-10-CM | POA: Diagnosis not present

## 2015-10-02 DIAGNOSIS — C50911 Malignant neoplasm of unspecified site of right female breast: Secondary | ICD-10-CM

## 2015-10-02 LAB — COMPREHENSIVE METABOLIC PANEL
ALT: 20 U/L (ref 0–55)
ANION GAP: 7 meq/L (ref 3–11)
AST: 13 U/L (ref 5–34)
Albumin: 3.8 g/dL (ref 3.5–5.0)
Alkaline Phosphatase: 83 U/L (ref 40–150)
BUN: 12.1 mg/dL (ref 7.0–26.0)
CALCIUM: 9.8 mg/dL (ref 8.4–10.4)
CHLORIDE: 106 meq/L (ref 98–109)
CO2: 27 meq/L (ref 22–29)
Creatinine: 0.8 mg/dL (ref 0.6–1.1)
EGFR: >90 mL/min/{1.73_m2} (ref >90)
Glucose: 111 mg/dl (ref 70–140)
POTASSIUM: 3.4 meq/L — AB (ref 3.5–5.1)
Sodium: 141 mEq/L (ref 136–145)
Total Bilirubin: <0.3 mg/dL (ref 0.20–1.20)
Total Protein: 7.8 g/dL (ref 6.4–8.3)

## 2015-10-02 MED ORDER — ALPRAZOLAM ER 1 MG PO TB24: 1.0000 mg | ORAL_TABLET | Freq: Every day | ORAL | Status: DC

## 2015-10-02 MED ORDER — GOSERELIN ACETATE 3.6 MG ~~LOC~~ IMPL
3.6000 mg | DRUG_IMPLANT | Freq: Once | SUBCUTANEOUS | Status: AC
Start: 2015-10-02 — End: 2015-10-02
  Administered 2015-10-02: 3.6 mg via SUBCUTANEOUS
  Filled 2015-10-02: qty 3.6

## 2015-10-06 ENCOUNTER — Ambulatory Visit
Admission: RE | Admit: 2015-10-06 | Discharge: 2015-10-06 | Disposition: A | Discharge status: Home / Self Care | Payer: Medicare Other | Source: Ambulatory Visit | Attending: Neurology | Admitting: Neurology

## 2015-10-06 DIAGNOSIS — G4489 Other headache syndrome: Secondary | ICD-10-CM

## 2015-10-06 DIAGNOSIS — H4710 Unspecified papilledema: Secondary | ICD-10-CM

## 2015-10-06 DIAGNOSIS — I676 Nonpyogenic thrombosis of intracranial venous system: Secondary | ICD-10-CM | POA: Diagnosis not present

## 2015-10-06 DIAGNOSIS — H539 Unspecified visual disturbance: Secondary | ICD-10-CM

## 2015-10-06 DIAGNOSIS — G08 Intracranial and intraspinal phlebitis and thrombophlebitis: Secondary | ICD-10-CM

## 2015-10-07 ENCOUNTER — Other Ambulatory Visit: Payer: Self-pay | Admitting: Oncology

## 2015-10-07 ENCOUNTER — Telehealth: Payer: Self-pay | Admitting: *Deleted

## 2015-10-07 ENCOUNTER — Other Ambulatory Visit: Payer: Self-pay | Admitting: Nurse Practitioner

## 2015-10-07 NOTE — Telephone Encounter (Signed)
We need to wait for the biopsy results to see what we do next. Have her call back and schedule an appointment 2 weeks after her biopsy and hopefully we will have the resukts then thank you !!

## 2015-10-07 NOTE — Telephone Encounter (Signed)
Patient returned Emma's call, appointment cancelled for May 9th. Patient states "Cardiology won't schedule appointment unless heart failure, patient states Dr. Jaynee Eagles talked to Cardiologist and sent referral. Receptionist can see the referral but doesn't know why patient is being referred".

## 2015-10-07 NOTE — Telephone Encounter (Signed)
Called and spoke to Dr. Wille Celeste [phone]  office his Nurse relayed . Patient needs to be seen by General cardiology .   Dr. Jaynee Eagles I scheduled patient with Dr. Skeet Latch at [facility] [id number] apt May 16 th arrive at 8:00 am  [place] May 10 th at 9:00 am .   Patient will call back  and schedule her follow with Dr. Jaynee Eagles after test are performed and done. Patient will ask for Terrence Dupont or Hinton Dyer.   I called patient and spoke to her she is aware of appointments .

## 2015-10-07 NOTE — Telephone Encounter (Signed)
-----   Message from Melvenia Beam, MD sent at 10/06/2015  6:37 PM EDT ----- MRV of the brain was normal thanks

## 2015-10-07 NOTE — Telephone Encounter (Signed)
Called pt and LVM. Relayed Dr Jaynee Eagles message below. Asked her to call back so I know she received message. Please cx appt for 5/10 once pt calls back if ok with pt.

## 2015-10-07 NOTE — Telephone Encounter (Signed)
Dr Jaynee Eagles- please advise. Do you want pt to keep 5/9 f/u with you? Or wait until after she goes for eval for biospy and has that done? She has to go for a consult first per pt.   Called and spoke to pt about normal MRV brain results per Dr Jaynee Eagles. Pt verbalized understanding. She had to r/s appt for biopsy evaluation back to 5/10. She has f/u with Dr Jaynee Eagles on 5/9.  Gave her the number to cardiologist [phone] to call and schedule appt. Advised we faxed referral over last Monday. She verbalized understanding.   I advised I will call her back if Dr Jaynee Eagles wants her to r/s 5/9 appt. She verbalized understanding.

## 2015-10-07 NOTE — Telephone Encounter (Signed)
Karen Hopkins- can you look into the referral to cardiology? It was for an abnormal echo.

## 2015-10-14 ENCOUNTER — Ambulatory Visit: Payer: Medicare Other | Admitting: Neurology

## 2015-10-14 ENCOUNTER — Other Ambulatory Visit: Payer: Self-pay | Admitting: Nurse Practitioner

## 2015-10-16 ENCOUNTER — Ambulatory Visit: Payer: Medicare Other

## 2015-10-17 ENCOUNTER — Encounter: Payer: Medicare Other | Admitting: Registered Nurse

## 2015-10-17 ENCOUNTER — Encounter: Payer: Self-pay | Admitting: Registered Nurse

## 2015-10-17 ENCOUNTER — Encounter: Payer: Medicare Other | Attending: Registered Nurse | Admitting: Registered Nurse

## 2015-10-17 VITALS — BP 136/78 | HR 104 | Resp 15

## 2015-10-17 DIAGNOSIS — G894 Chronic pain syndrome: Secondary | ICD-10-CM

## 2015-10-17 DIAGNOSIS — M546 Pain in thoracic spine: Secondary | ICD-10-CM | POA: Diagnosis present

## 2015-10-17 DIAGNOSIS — I676 Nonpyogenic thrombosis of intracranial venous system: Secondary | ICD-10-CM

## 2015-10-17 DIAGNOSIS — Z79899 Other long term (current) drug therapy: Secondary | ICD-10-CM

## 2015-10-17 DIAGNOSIS — G893 Neoplasm related pain (acute) (chronic): Secondary | ICD-10-CM | POA: Diagnosis not present

## 2015-10-17 DIAGNOSIS — M7918 Myalgia, other site: Secondary | ICD-10-CM

## 2015-10-17 DIAGNOSIS — G243 Spasmodic torticollis: Secondary | ICD-10-CM

## 2015-10-17 DIAGNOSIS — M545 Low back pain, unspecified: Secondary | ICD-10-CM

## 2015-10-17 DIAGNOSIS — Z5181 Encounter for therapeutic drug level monitoring: Secondary | ICD-10-CM

## 2015-10-17 DIAGNOSIS — I890 Lymphedema, not elsewhere classified: Secondary | ICD-10-CM | POA: Diagnosis not present

## 2015-10-17 DIAGNOSIS — M797 Fibromyalgia: Secondary | ICD-10-CM

## 2015-10-17 MED ORDER — OXYCODONE HCL 5 MG PO TABS: 5.0000 mg | ORAL_TABLET | Freq: Four times a day (QID) | ORAL | Status: DC | PRN

## 2015-10-17 NOTE — Progress Notes (Signed)
Subjective:    Patient ID: Karen Hopkins, female    DOB: 06-08-1975, 40 y.o.   MRN: [id number]  HPI: [name] is a 40 year old female who returns for follow up for chronic pain and medication refill. She states her pain is located in her neck,right shoulder and mid- lower back.She rates her pain 5. Her current exercise regime is walking and performing stretching exercises. Dr. Letta Pate has recommended a trial of twice a week for 3 weeks with [facility].  [name] is a 40 year old female with history of right breast carcinoma, Status post modified radical mastectomy with postoperative radiation therapy In 2014. She had subsequent development of chronic right shoulder and right neck pain. She underwent reconstruction with tissue expanders as well as a breast reduction surgery in 2015. Her current analgesic is Oxycodone, in the past she has been prescribed tramadol, hydrocodone and Methadone. She has received the following treatments trigger point injections, single needle technique with acupuncture needle, dry needilng with physical therapy and out patient physical therapy. [name] has received relief with TENS Unit. [name] has been diagnosed with neoplasm related pain, Myofascial Muscle Pain, Adhesive Capsulitis of the Right Shoulder and Lymphedema of her right upper extremity. I strongly believe she will benefit from the peripheral nerve stimulation. Her pain has impacted her ability to interact with family, friends as well as attend social events. She is now receiving home care to help her with activities of daily Living. 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 [name] chronic pain has impacted her life. This treatment modality will help to promote optimal healthy life style with family and friends.   Pain Inventory Average Pain 7 Pain Right Now 5 My pain is  sharp, burning, dull, stabbing, tingling and aching  In the last 24 hours, has pain interfered with the following? General activity NA Relation with others NA Enjoyment of life NA What TIME of day is your pain at its worst? moirning, daytime, night Sleep (in general) Poor  Pain is worse with: sitting, inactivity and some activites Pain improves with: therapy/exercise, medication and injections Relief from Meds: good  Mobility how many minutes can you walk? 10 ability to climb steps?  yes do you drive?  yes Do you have any goals in this area?  yes  Function disabled: date disabled NA  Neuro/Psych numbness spasms dizziness confusion depression anxiety  Prior Studies Any changes since last visit?  yes x-rays CT/MRI  Physicians involved in your care Primary care . Neurologist .   Family History  Problem Relation Age of Onset  . Hypertension Mother   . Diabetes type II Father   . Prostate cancer Father   . Stroke Neg Hx    Social History   Social History  . Marital Status: Single    Spouse Name: N/A  . Number of Children: 3  . Years of Education: 16   Occupational History  . Disability     Social History Main Topics  . Smoking status: Former Smoker -- 0.25 packs/day for 5 years    Quit date: 04/26/2010  . Smokeless tobacco: Never Used  . Alcohol Use: No  . Drug Use: No  . Sexual Activity: Not Currently    Birth Control/ Protection: Surgical   Other Topics Concern  . None   Social History Narrative   Lives with kids  Caffeine use: none    Past Surgical History  Procedure Laterality Date  . Supra-umbilical hernia  123456  . Nasal septum surgery    . Latissimus flap to breast Right 03/12/2013    Procedure: RIGHT BREAST LATISSIMUS FLAP WITH EXPANDER PLACEMENT;  Surgeon: Theodoro Kos, DO;  Location: [facility];  Service: Plastics;  Laterality: Right;  . Cesarean section  07/15/2001; 03/18/2004; 11/21/2008  . Tubal ligation  11/21/2008  . Portacath  placement Left 09/12/2009  . Modified radical mastectomy Right 03/11/2010  . Port-a-cath removal Left 12/01/2010  . Removal of tissue expander and placement of implant Right 08/23/2013    Procedure: REMOVAL RIGHT TISSUE EXPANDER AND PLACEMENT OF IMPLANT TO RIGHT BREAST ;  Surgeon: Theodoro Kos, DO;  Location: [place];  Service: Plastics;  Laterality: Right;  . Breast reduction surgery Left 08/23/2013    Procedure: LEFT BREAST REDUCTION  ;  Surgeon: Theodoro Kos, DO;  Location: [facility];  Service: Plastics;  Laterality: Left;  . Liposuction Bilateral 08/23/2013    Procedure: LIPOSUCTION;  Surgeon: Theodoro Kos, DO;  Location: [place];  Service: Plastics;  Laterality: Bilateral;   Past Medical History  Diagnosis Date  . Seasonal allergies   . Lymphedema of arm     right; no BP or puncture to right arm  . Depression   . Anxiety   . Sinus headache   . Hypertension     under control with med., has been on med. x 2 yr.  . History of breast cancer 2011    right  . History of chemotherapy 2011  . History of radiation therapy 2011  . Cancer (HCC)    BP 136/78 mmHg  Pulse 104  Resp 15  SpO2 96%  Opioid Risk Score:   Fall Risk Score:  `1  Depression screen PHQ 2/9  Depression screen [facility] 2/9 08/04/2015 06/25/2015 03/04/2015 01/20/2015 01/07/2015 11/21/2014 10/07/2014  Decreased Interest 0 0 0 0 0 0 0  Down, Depressed, Hopeless 0 0 0 0 0 0 0  PHQ - 2 Score 0 0 0 0 0 0 0  Altered sleeping - - - - - - -  Tired, decreased energy - - - - - - -  Change in appetite - - - - - - -  Feeling bad or failure about yourself  - - - - - - -  Trouble concentrating - - - - - - -  Moving slowly or fidgety/restless - - - - - - -  Suicidal thoughts - - - - - - -  PHQ-9 Score - - - - - - -     Review of Systems  Constitutional: Positive for unexpected weight change.  Gastrointestinal: Positive for constipation.  Neurological: Positive for dizziness and  numbness.       Spasms  Psychiatric/Behavioral: Positive for confusion and dysphoric mood. The patient is nervous/anxious.   All other systems reviewed and are negative.      Objective:   Physical Exam  Constitutional: She is oriented to [place], place, and time. She appears well-developed and well-nourished.  HENT:  Head: Normocephalic and atraumatic.  Neck: Normal range of motion. Neck supple.  Cardiovascular: Normal rate and regular rhythm.   Pulmonary/Chest: Effort normal and breath sounds normal.  Musculoskeletal:  Normal Muscle Bulk and Muscle Testing Reveals:  Upper Extremities: Full ROM and Muscle Strength 5/5 Right AC Joint Tenderness Thoracic Paraspinal Tenderness: T-1- T-3 Lumbar Paraspinal Tenderness: L-3- L-5 Lower Extremities: Full  ROM and Muscle Strength 5/5 Arises from chair with ease Narrow Based Gait  Neurological: She is alert and oriented to [place], place, and time.  Skin: Skin is warm and dry.  Psychiatric: She has a normal mood and affect.  Nursing note and vitals reviewed.         Assessment & Plan:  1.Myofascial pain syndrome chronic postoperative, as well as post radiation: S/P Breast Reconstruction Surgery x 2.  Refilled: Oxycodone 5 mg one tablet every 6 hours as needed for severe pain.# 120. Continue HEP. We will continue the opioid monitoring program, this consists of regular clinic visits, examinations, urine drug screen, pill counts as well as use of [place] Controlled Substance Reporting System. 2. Neoplasm Related Pain: Dr. Letta Pate has recommended a trial of twice a week Bio Wave for 3 weeks,awaiting insurance approval. 3. Obesity: Losing weight continue with Healthy Diet and Exercise Regime.   20 minutes of face to face patient care time was spent during this visit. All questions were encouraged and answered

## 2015-10-21 ENCOUNTER — Encounter: Payer: Self-pay | Admitting: Cardiovascular Disease

## 2015-10-21 ENCOUNTER — Ambulatory Visit (INDEPENDENT_AMBULATORY_CARE_PROVIDER_SITE_OTHER): Payer: Medicare Other | Admitting: Cardiovascular Disease

## 2015-10-21 VITALS — BP 113/80 | HR 79 | Ht 64.0 in | Wt 254.8 lb

## 2015-10-21 DIAGNOSIS — R0683 Snoring: Secondary | ICD-10-CM | POA: Diagnosis not present

## 2015-10-21 DIAGNOSIS — I10 Essential (primary) hypertension: Secondary | ICD-10-CM | POA: Diagnosis not present

## 2015-10-21 DIAGNOSIS — R600 Localized edema: Secondary | ICD-10-CM

## 2015-10-21 DIAGNOSIS — Z79899 Other long term (current) drug therapy: Secondary | ICD-10-CM | POA: Diagnosis not present

## 2015-10-21 DIAGNOSIS — I676 Nonpyogenic thrombosis of intracranial venous system: Secondary | ICD-10-CM

## 2015-10-21 MED ORDER — LOSARTAN POTASSIUM 50 MG PO TABS: 50.0000 mg | ORAL_TABLET | Freq: Every day | ORAL | Status: DC

## 2015-10-21 MED ORDER — CARVEDILOL 6.25 MG PO TABS: 6.2500 mg | ORAL_TABLET | Freq: Two times a day (BID) | ORAL | Status: DC

## 2015-10-21 NOTE — Patient Instructions (Addendum)
Medication Instructions:  STOP POTASSIUM  STOP MAXZIDE (TRIAMTERENE-HCTZ)   START LOSARTAN 50 MG DAILY   START CARVEDILOL (COREG) 6.25 MG TWICE A DAY   Labwork: BMET AT [place] TO YOUR NEXT OFFICE VISIT  [address]  Testing/Procedures: Your physician has recommended that you have a sleep study. This test records several body functions during sleep, including: brain activity, eye movement, oxygen and carbon dioxide blood levels, heart rate and rhythm, breathing rate and rhythm, the flow of air through your mouth and nose, snoring, body muscle movements, and chest and belly movement.  Follow-Up: Your physician recommends that you schedule a follow-up appointment in: 2 WEEKS  If you need a refill on your cardiac medications before your next appointment, please call your pharmacy.

## 2015-10-21 NOTE — Progress Notes (Signed)
Cardiology Office Note   Date:  10/21/2015   ID:  Karen Hopkins, DOB May 14, 1976, MRN [id number]  PCP:  Chrisandra Netters, MD  Cardiologist:   Skeet Latch, MD   Chief Complaint  Patient presents with  . New Patient (Initial Visit)    reffered due to echo; SOB;none. CHEST PAIN;sharp pain on right side of chest, under right breast. LIGHTHEADED/DIZZINESS;dizziness due to medication. PAIN OR CRAMPING IN LEGS;none. EDEMA; in legs and feet.    History of Present Illness: Karen Hopkins is a 40 y.o. female with breast cancer s/p mastectomy, chemotherapy (4 cycles of doxorubicin and cyclophosphamide, 12 cycles of paclitaxel and trastuzumab) diabetes who presents for an evaluation of lower extremity edema and shortness of breath.  She mentioned to her oncologist, Dr. Lurline Del, that she gets short of breath when walking up stairs and has lower extremity edema.  She had an echo that revealed LVEF 55-60% and normal diastolic function. Her global longitudinal strain was -16.2%, which is slightly higher (worse) than her prior, which was -20%.  She was referred to cardiology for evaluation.   Overall Ms. Purifoy has been fair.  She feels tired and notes shortness of breath with exertion.  She denies chest pain.  She notes some lower extremity edema that has improved with lasix.  She notes that she snores and sometimes awakens herself gasping for air.  She also does not feel well-rested in the morning.    Past Medical History  Diagnosis Date  . Seasonal allergies   . Lymphedema of arm     right; no BP or puncture to right arm  . Depression   . Anxiety   . Sinus headache   . Hypertension     under control with med., has been on med. x 2 yr.  . History of breast cancer 2011    right  . History of chemotherapy 2011  . History of radiation therapy 2011  . Cancer [facility])     Past Surgical History  Procedure Laterality Date  . Supra-umbilical hernia  6213  . Nasal septum surgery    .  Latissimus flap to breast Right 03/12/2013    Procedure: RIGHT BREAST LATISSIMUS FLAP WITH EXPANDER PLACEMENT;  Surgeon: Theodoro Kos, DO;  Location: [place];  Service: Plastics;  Laterality: Right;  . Cesarean section  07/15/2001; 03/18/2004; 11/21/2008  . Tubal ligation  11/21/2008  . Portacath placement Left 09/12/2009  . Modified radical mastectomy Right 03/11/2010  . Port-a-cath removal Left 12/01/2010  . Removal of tissue expander and placement of implant Right 08/23/2013    Procedure: REMOVAL RIGHT TISSUE EXPANDER AND PLACEMENT OF IMPLANT TO RIGHT BREAST ;  Surgeon: Theodoro Kos, DO;  Location: [place];  Service: Plastics;  Laterality: Right;  . Breast reduction surgery Left 08/23/2013    Procedure: LEFT BREAST REDUCTION  ;  Surgeon: Theodoro Kos, DO;  Location: [place];  Service: Plastics;  Laterality: Left;  . Liposuction Bilateral 08/23/2013    Procedure: LIPOSUCTION;  Surgeon: Theodoro Kos, DO;  Location: [place];  Service: Plastics;  Laterality: Bilateral;     Current Outpatient Prescriptions  Medication Sig Dispense Refill  . ACCU-CHEK SMARTVIEW test strip USE TO CHECK BLOOD SUGAR LEVELS FOUR TIMES DAILY AS DIRECTED 100 each 3  . ALPRAZolam (ALPRAZOLAM XR) 1 MG 24 hr tablet Take 1 tablet (1 mg total) by mouth daily. 30 tablet 2  . anastrozole (ARIMIDEX) 1 MG tablet Take 1 tablet (  1 mg total) by mouth daily. 90 tablet 4  . blood glucose meter kit and supplies KIT Dispense based on patient and insurance preference. Use up to four times daily as directed. (FOR ICD-9 250.00, 250.01). 1 each 0  . citalopram (CELEXA) 20 MG tablet TAKE ONE (1) TABLET BY MOUTH EVERY DAY 30 tablet 6  . fluticasone (FLONASE) 50 MCG/ACT nasal spray Place 2 sprays into both nostrils daily. 16 g 12  . furosemide (LASIX) 20 MG tablet Take 1 tablet (20 mg total) by mouth daily. 30 tablet 3  . goserelin (ZOLADEX) 3.6 MG injection Inject 3.6 mg into the skin every 28  (twenty-eight) days.    . metFORMIN (GLUCOPHAGE) 500 MG tablet Take 1 tablet (500 mg total) by mouth 2 (two) times daily with a meal. 60 tablet 11  . metoCLOPramide (REGLAN) 10 MG tablet Take 1 tablet (10 mg total) by mouth every 8 (eight) hours as needed. 60 tablet 1  . naproxen (NAPROSYN) 500 MG tablet Take 1 tablet (500 mg total) by mouth 2 (two) times daily with a meal. 60 tablet 2  . oxyCODONE (OXY IR/ROXICODONE) 5 MG immediate release tablet Take 1 tablet (5 mg total) by mouth every 6 (six) hours as needed for severe pain. 120 tablet 0  . polyethylene glycol powder (GLYCOLAX) powder Take 17 g by mouth daily. 500 g 5  . tiZANidine (ZANAFLEX) 4 MG tablet TAKE ONE (1) TABLET BY MOUTH 3 TIMES DAILY 90 tablet 3  . topiramate (TOPAMAX) 50 MG tablet Take 2 tablets (100 mg total) by mouth at bedtime. 60 tablet 12  . carvedilol (COREG) 6.25 MG tablet Take 1 tablet (6.25 mg total) by mouth 2 (two) times daily with a meal. 60 tablet 5  . losartan (COZAAR) 50 MG tablet Take 1 tablet (50 mg total) by mouth daily. 30 tablet 5   No current facility-administered medications for this visit.    Allergies:   Penicillins; Lyrica; Meloxicam; and Robaxin    Social History:  The patient  reports that she quit smoking about 5 years ago. She has never used smokeless tobacco. She reports that she does not drink alcohol or use illicit drugs.   Family History:  The patient's family history includes Diabetes type II in her father; Hypertension in her mother; Prostate cancer in her father. There is no history of Stroke.    ROS:  Please see the history of present illness.   Otherwise, review of systems are positive for none.   All other systems are reviewed and negative.    PHYSICAL EXAM: VS:  BP 113/80 mmHg  Pulse 79  Ht _0  (1.626 m)  Wt 115.577 kg (254 lb 12.8 oz)  BMI 43.71 kg/m2 , BMI Body mass index is 43.71 kg/(m^2). GENERAL:  Well appearing HEENT:  Pupils equal round and reactive, fundi not  visualized, oral mucosa unremarkable NECK:  No jugular venous distention, waveform within normal limits, carotid upstroke brisk and symmetric, no bruits, no thyromegaly LYMPHATICS:  No cervical adenopathy LUNGS:  Clear to auscultation bilaterally HEART:  RRR.  PMI not displaced or sustained,S1 and S2 within normal limits, no S3, no S4, no clicks, no rubs, no murmurs ABD:  Flat, positive bowel sounds normal in frequency in pitch, no bruits, no rebound, no guarding, no midline pulsatile mass, no hepatomegaly, no splenomegaly EXT:  2 plus pulses throughout, no edema, no cyanosis no clubbing SKIN:  No rashes no nodules NEURO:  Cranial nerves II through XII grossly intact, motor  grossly intact throughout [facility]:  Cognitively intact, oriented to [place] place and time   EKG:  EKG is ordered today. The ekg ordered today demonstrates sinus rhythm rate 79 bpm.    Recent Labs: 09/17/2015: TSH 1.390 09/18/2015: HGB 12.8; Platelets 340 10/02/2015: ALT 20; BUN 12.1; Creatinine 0.8; Potassium 3.4*; Sodium 141    Lipid Panel    Component Value Date/Time   CHOL 100 09/17/2015 0817   CHOL 87 10/02/2014 0854   TRIG 105 09/17/2015 0817   HDL 24* 09/17/2015 0817   HDL 23* 10/02/2014 0854   CHOLHDL 4.2 09/17/2015 0817   CHOLHDL 3.8 10/02/2014 0854   VLDL 13 10/02/2014 0854   LDLCALC 55 09/17/2015 0817   LDLCALC 51 10/02/2014 0854      Wt Readings from Last 3 Encounters:  10/21/15 115.577 kg (254 lb 12.8 oz)  09/22/15 115.667 kg (255 lb)  09/18/15 116.438 kg (256 lb 11.2 oz)      ASSESSMENT AND PLAN:  # Hypertension: # Hypokalemia: Ms. Marcotte's BP is well-controlled.  However, her GLS is mildly abnormal and her potassium is low, requiring supplementation.  We will stop Maxzide, which is likely worsening the hypokalemia and shouldn't be taken with daily lasix.  We will start losartan 50 mg daily and carvedilol 6.25 mg bid.  She will follow up in 2 weeks for a BMP and blood pressure check.  Stop  potassium.  # GLS: Mildly abnormal but systolic and diastolic Repeat echo in 6 months  # LE edema: No edema on exam today.  Continue lasix and stop Maxzide.   Current medicines are reviewed at length with the patient today.  The patient does not have concerns regarding medicines.  The following changes have been made:  no change  Labs/ tests ordered today include:   Orders Placed This Encounter  Procedures  . Basic metabolic panel  . Split night study     Disposition:   FU with Faith Branan C. Oval Linsey, MD, [facility] in 2 weeks.    This note was written with the assistance of speech recognition software.  Please excuse any transcriptional errors.  Signed, Torrion Witter C. Oval Linsey, MD, [facility]  10/21/2015 10:22 AM    [facility]

## 2015-10-23 ENCOUNTER — Telehealth: Payer: Self-pay | Admitting: *Deleted

## 2015-10-23 NOTE — Telephone Encounter (Signed)
------------------------------------------------------------   Henri Kaz Puccinelli              CID [id number]  Patient SAME                 Pt's Dr Jaynee Eagles        Area Code 336 Phone# [phone] [place] 1975/08/17      RE FOR [facility]: FOLLOWING UP ON GETTING MRI RESULTS     SENT TO DR Toy Cookey, P/C/B                             Disp:Y/N N If Y = C/B If No Response In 26minutes ============================================================ Called pt left v/m, for pt to call the office.

## 2015-10-23 NOTE — Telephone Encounter (Signed)
Pt returned Debra's call, she was transferred to [facility]

## 2015-10-27 ENCOUNTER — Ambulatory Visit: Payer: Medicare Other | Admitting: Physical Medicine & Rehabilitation

## 2015-10-27 ENCOUNTER — Telehealth: Payer: Self-pay | Admitting: *Deleted

## 2015-10-27 NOTE — Telephone Encounter (Signed)
LVM for pt about results per Dr Jaynee Eagles note. Gave [facility] phone number if she has further questions.

## 2015-10-27 NOTE — Telephone Encounter (Signed)
-----   Message from Melvenia Beam, MD sent at 10/26/2015  9:14 AM EDT ----- Cardiac event monitor normal thanks

## 2015-10-28 ENCOUNTER — Telehealth: Payer: Self-pay | Admitting: Cardiovascular Disease

## 2015-10-28 MED ORDER — FUROSEMIDE 40 MG PO TABS: 40.0000 mg | ORAL_TABLET | Freq: Every day | ORAL | Status: DC

## 2015-10-28 NOTE — Telephone Encounter (Signed)
I would prefer that she increase her lasix to 40 mg and continue the losartan and carvedilol as ordered.  I don't want her on both lasix and HCTZ.

## 2015-10-28 NOTE — Telephone Encounter (Signed)
Advised patient

## 2015-10-28 NOTE — Telephone Encounter (Signed)
Left detailed msg for pt to call.

## 2015-10-28 NOTE — Telephone Encounter (Signed)
Would suggest that she go on losartan hctz, but because of low potassium readings, would probably also need to take KCl 20 mEq.  Please review with Dr. Oval Linsey, as she just discontinued triam/hctz.

## 2015-10-28 NOTE — Telephone Encounter (Signed)
Pt has concerns after being switched to Losartan. States she did much better on her previous med (Maxzide). States now that she's on losartan she's having fluid retention issues.  We reviewed recent notes and history together. Pt understands she was taken off the maxzide, kept on furosemide because Dr. Oval Linsey had concerns about the potentiative effects of the diuretic combo on her serum K.  Pt notes since last week she has had noticeable swelling and can press on her ankles and see indentation. She denies SOB, orthopnea, exertional, otherwise. She states she does not have an accurate scale weight but can tell weight is up - feels bigger.  Advised patient for today to take extra furosemide for a total of 40mg . Continue other current medications as listed. Pt understands I will seek recommendation on what to do to address her fluid retention going forward. Also will request recommendation to address serum K and whether supplemental potassium should be considered - this was discontinued at last visit.  Dr. Oval Linsey out of office. Seeking 1st recommendations from pharmD. Will route to physician if recommended.

## 2015-10-28 NOTE — Telephone Encounter (Signed)
NEw Message  Pt calling to speak w/ RN about her Rx for Losartan. Please call back and discuss.

## 2015-10-29 ENCOUNTER — Telehealth: Payer: Self-pay | Admitting: *Deleted

## 2015-10-29 NOTE — Telephone Encounter (Signed)
Pt MRi CD, and reports mailed to DR. Toy Cookey.

## 2015-10-30 ENCOUNTER — Ambulatory Visit (HOSPITAL_BASED_OUTPATIENT_CLINIC_OR_DEPARTMENT_OTHER): Payer: Medicare Other | Admitting: Nurse Practitioner

## 2015-10-30 ENCOUNTER — Ambulatory Visit (HOSPITAL_BASED_OUTPATIENT_CLINIC_OR_DEPARTMENT_OTHER): Payer: Medicare Other

## 2015-10-30 ENCOUNTER — Telehealth: Payer: Self-pay | Admitting: Oncology

## 2015-10-30 ENCOUNTER — Encounter: Payer: Self-pay | Admitting: Nurse Practitioner

## 2015-10-30 ENCOUNTER — Other Ambulatory Visit: Payer: Self-pay | Admitting: *Deleted

## 2015-10-30 ENCOUNTER — Other Ambulatory Visit (HOSPITAL_BASED_OUTPATIENT_CLINIC_OR_DEPARTMENT_OTHER): Payer: Medicare Other

## 2015-10-30 ENCOUNTER — Telehealth: Payer: Self-pay | Admitting: *Deleted

## 2015-10-30 VITALS — BP 115/63 | HR 89 | Temp 98.3°F | Resp 19 | Ht 64.0 in | Wt 269.5 lb

## 2015-10-30 DIAGNOSIS — M2550 Pain in unspecified joint: Secondary | ICD-10-CM | POA: Diagnosis not present

## 2015-10-30 DIAGNOSIS — I890 Lymphedema, not elsewhere classified: Secondary | ICD-10-CM

## 2015-10-30 DIAGNOSIS — K5909 Other constipation: Secondary | ICD-10-CM

## 2015-10-30 DIAGNOSIS — N951 Menopausal and female climacteric states: Secondary | ICD-10-CM | POA: Diagnosis not present

## 2015-10-30 DIAGNOSIS — Z5111 Encounter for antineoplastic chemotherapy: Secondary | ICD-10-CM | POA: Diagnosis not present

## 2015-10-30 DIAGNOSIS — C50811 Malignant neoplasm of overlapping sites of right female breast: Secondary | ICD-10-CM

## 2015-10-30 DIAGNOSIS — C50411 Malignant neoplasm of upper-outer quadrant of right female breast: Secondary | ICD-10-CM

## 2015-10-30 DIAGNOSIS — C50911 Malignant neoplasm of unspecified site of right female breast: Secondary | ICD-10-CM

## 2015-10-30 LAB — CBC WITH DIFFERENTIAL/PLATELET
BASO%: 0.2 % (ref 0.0–2.0)
BASOS ABS: 0 10*3/uL (ref 0.0–0.1)
EOS%: 5.6 % (ref 0.0–7.0)
Eosinophils Absolute: 0.5 10*3/uL (ref 0.0–0.5)
HEMATOCRIT: 34.8 % (ref 34.8–46.6)
HGB: 11.7 g/dL (ref 11.6–15.9)
LYMPH#: 3.4 10*3/uL — AB (ref 0.9–3.3)
LYMPH%: 36 % (ref 14.0–49.7)
MCH: 28.7 pg (ref 25.1–34.0)
MCHC: 33.6 g/dL (ref 31.5–36.0)
MCV: 85.5 fL (ref 79.5–101.0)
MONO#: 0.4 10*3/uL (ref 0.1–0.9)
MONO%: 4.6 % (ref 0.0–14.0)
NEUT#: 5 10*3/uL (ref 1.5–6.5)
NEUT%: 53.6 % (ref 38.4–76.8)
PLATELETS: 279 10*3/uL (ref 145–400)
RBC: 4.07 10*6/uL (ref 3.70–5.45)
RDW: 15.4 % — ABNORMAL HIGH (ref 11.2–14.5)
WBC: 9.4 10*3/uL (ref 3.9–10.3)

## 2015-10-30 LAB — COMPREHENSIVE METABOLIC PANEL
ALT: 16 U/L (ref 0–55)
ANION GAP: 7 meq/L (ref 3–11)
AST: 10 U/L (ref 5–34)
Albumin: 3.4 g/dL — ABNORMAL LOW (ref 3.5–5.0)
Alkaline Phosphatase: 78 U/L (ref 40–150)
BUN: 14.5 mg/dL (ref 7.0–26.0)
CALCIUM: 9.3 mg/dL (ref 8.4–10.4)
CO2: 24 meq/L (ref 22–29)
CREATININE: 1.1 mg/dL (ref 0.6–1.1)
Chloride: 113 mEq/L — ABNORMAL HIGH (ref 98–109)
EGFR: 71 mL/min/{1.73_m2} — ABNORMAL LOW (ref >90)
Glucose: 92 mg/dl (ref 70–140)
Potassium: 3.6 mEq/L (ref 3.5–5.1)
Sodium: 144 mEq/L (ref 136–145)
TOTAL PROTEIN: 7.1 g/dL (ref 6.4–8.3)

## 2015-10-30 MED ORDER — GOSERELIN ACETATE 3.6 MG ~~LOC~~ IMPL
3.6000 mg | DRUG_IMPLANT | Freq: Once | SUBCUTANEOUS | Status: AC
  Administered 2015-10-30: 3.6 mg via SUBCUTANEOUS
  Filled 2015-10-30: qty 3.6

## 2015-10-30 MED ORDER — POLYETHYLENE GLYCOL 3350 17 GM/SCOOP PO POWD: 17.0000 g | Freq: Every day | ORAL | Status: DC

## 2015-10-30 NOTE — Telephone Encounter (Signed)
Please call patient back if needed.   FYI "Someone just called me few minutes ago.  No message because my voicemail is full.  I believe they were calling about my potassium."   Informed her potassium is wnl =3.6.  Reports she uses a K+ supplement.  Encouraged to continue use as it is working well.  No further questions.

## 2015-10-30 NOTE — Telephone Encounter (Signed)
Added appt

## 2015-10-30 NOTE — Progress Notes (Signed)
ID: Karen Hopkins   DOB: May 18, 1976  MR#: [id number]  QAS#:[id number]  PCP: Chrisandra Netters, MD GYN: SU: Osborn Coho, MD;  Theodoro Kos, MD OTHER MD: Alysia Penna, MD;  Melrose Nakayama, MD  CHIEF COMPLAINT:  Locally advanced right breast cancer  CURRENT TREATMENT: Tamoxifen; goserelin  HISTORY OF PRESENT ILLNESS: From the original intake note:  The patient noted swelling and redness, as well as some tenderness, over her right breast for some time. She thought this might be related to pregnancy, since she delivered her third child about 8 months ago. As the problem did not improve, she brought it to her primary care physician's attention. He treated her with Bactrim, which she says she could not tolerate because of nausea and vomiting, but which in any case did not resolve the problem, so he set her up for mammography at [facility] 08/26/2009. Dr. Sadie Haber found by exam marked skin thickening and erythema of the right breast extending pretty much throughout the breast. By mammography there were scattered fibroglandular densities, and an asymmetric density in the right upper outer quadrant with suspicious microcalcifications. There were also enlarged right axillary lymph nodes. The left appeared normal. By ultrasound there was an ill defined area of hypoechoic tissue in the right upper outer quadrant that was difficult to measure. There were also abnormal right axillary lymph nodes.  Ultrasound guided biopsy was performed the same day, and showed [id number]) a poorly differentiated invasive ductal carcinoma, which was estrogen receptor poor at 3%, progesterone receptor positive at 14% with an elevated proliferation marker at 50%, but importantly with strong amplification of HER-2 by CISH with a ratio of 5.20.  Bilateral breast MRIs were obtained 09/02/2009. This showed an area up to 10.6 cm in the right breast showing confluent mass and non-mass enhancement. There was marked skin  thickening involving the entire right breast, and numerous bulky right axillary lymph nodes were identified. The left breast was unremarkable, and there was no evidence of internal mammary lymph node involvement.  Patient was treated in the neoadjuvant setting with 4 dose dense cycles of doxorubicin and cyclophosphamide, followed by 12 weekly doses of paclitaxel and trastuzumab. Jadence underwent definitive right modified radical mastectomy in October 2011, with a residual 2.5 mm area of tumor in the breast, grade 3, and one of 21 lymph nodes involved.   Her subsequent history is as detailed below  INTERVAL HISTORY: Karen Hopkins returns today for follow-up of her triple positive breast cancer. She continues on anastrozole daily and tolerates this reasonably well. She receives goserelin monthly, and is due for her next injection today. Between the 2 of them she complains of hot flashes and some joint stiffness.   REVIEW OF SYSTEMS: [name]'s headaches have been controlled on topamax. She is still scheduled for a biopsy of some orbital vessels that appeared to be abnormal. Her antihypertensive meds have been switched around by her cardiologist, and she believes the coreg is making her retain fluid. She feels like her legs and ankles are swelling and she has gained a lot of weight in the past week or so. She follows up with cardiology about this next month. [name] denies fevers, chills, nausea, or vomiting. She is chronically constipated from pain med use. She takes miralax and is going to start stool softeners. She denies shortness of breath, chest pain, cough, or palpitations. A detailed review of systems is otherwise stable.  PAST MEDICAL HISTORY: Past Medical History  Diagnosis Date  . Seasonal allergies   .  Lymphedema of arm     right; no BP or puncture to right arm  . Depression   . Anxiety   . Sinus headache   . Hypertension     under control with med., has been on med. x 2 yr.  . History of breast  cancer 2011    right  . History of chemotherapy 2011  . History of radiation therapy 2011  . Cancer [facility])     PAST SURGICAL HISTORY: Past Surgical History  Procedure Laterality Date  . Supra-umbilical hernia  3976  . Nasal septum surgery    . Latissimus flap to breast Right 03/12/2013    Procedure: RIGHT BREAST LATISSIMUS FLAP WITH EXPANDER PLACEMENT;  Surgeon: Theodoro Kos, DO;  Location: [place];  Service: Plastics;  Laterality: Right;  . Cesarean section  07/15/2001; 03/18/2004; 11/21/2008  . Tubal ligation  11/21/2008  . Portacath placement Left 09/12/2009  . Modified radical mastectomy Right 03/11/2010  . Port-a-cath removal Left 12/01/2010  . Removal of tissue expander and placement of implant Right 08/23/2013    Procedure: REMOVAL RIGHT TISSUE EXPANDER AND PLACEMENT OF IMPLANT TO RIGHT BREAST ;  Surgeon: Theodoro Kos, DO;  Location: [place];  Service: Plastics;  Laterality: Right;  . Breast reduction surgery Left 08/23/2013    Procedure: LEFT BREAST REDUCTION  ;  Surgeon: Theodoro Kos, DO;  Location: [place];  Service: Plastics;  Laterality: Left;  . Liposuction Bilateral 08/23/2013    Procedure: LIPOSUCTION;  Surgeon: Theodoro Kos, DO;  Location: [place];  Service: Plastics;  Laterality: Bilateral;    FAMILY HISTORY Family History  Problem Relation Age of Onset  . Hypertension Mother   . Diabetes type II Father   . Prostate cancer Father   . Stroke Neg Hx   There is no history of breast or ovarian cancer in the immediate family, but of the patient's mother's mother's six sisters, two (the patient's great-aunts) had ovarian cancer.  GYNECOLOGIC HISTORY:   (Updated 11/26/2013) The patient is GX, P3. First child was premature. Age at first delivery, 40 years old.   she status post tubal ligation. She continues to have menstrual cycles, although irregularly.  SOCIAL HISTORY: (Updated 11/26/2013) She is currently disabled.  Clorine's children are [name], and [name]. They are 11, 10 and 5 as of DEC 2015. They are all boys, all at home. The patient attends a non-denominational church in [place], which she considers her home, but she is currently living in [place].   ADVANCED DIRECTIVES: not in place  HEALTH MAINTENANCE:  (Updated 11/26/2013) Social History  Substance Use Topics  . Smoking status: Former Smoker -- 0.25 packs/day for 5 years    Quit date: 04/26/2010  . Smokeless tobacco: Never Used  . Alcohol Use: No    Colonoscopy: Never  PAP: April 2015  Bone density: Never  Lipid panel: August 2014    Allergies  Allergen Reactions  . Penicillins Swelling    FACIAL SWELLING  . Lyrica [Pregabalin] Nausea Only    .  Marland Kitchen Meloxicam Nausea Only  . Robaxin [Methocarbamol] Nausea Only    Current Outpatient Prescriptions  Medication Sig Dispense Refill  . ACCU-CHEK SMARTVIEW test strip USE TO CHECK BLOOD SUGAR LEVELS FOUR TIMES DAILY AS DIRECTED 100 each 3  . ALPRAZolam (ALPRAZOLAM XR) 1 MG 24 hr tablet Take 1 tablet (1 mg total) by mouth daily. 30 tablet 2  . anastrozole (ARIMIDEX) 1 MG tablet Take 1 tablet (1 mg  total) by mouth daily. 90 tablet 4  . blood glucose meter kit and supplies KIT Dispense based on patient and insurance preference. Use up to four times daily as directed. (FOR ICD-9 250.00, 250.01). 1 each 0  . carvedilol (COREG) 6.25 MG tablet Take 1 tablet (6.25 mg total) by mouth 2 (two) times daily with a meal. 60 tablet 5  . citalopram (CELEXA) 20 MG tablet TAKE ONE (1) TABLET BY MOUTH EVERY DAY 30 tablet 6  . furosemide (LASIX) 40 MG tablet Take 1 tablet (40 mg total) by mouth daily. 90 tablet 3  . goserelin (ZOLADEX) 3.6 MG injection Inject 3.6 mg into the skin every 28 (twenty-eight) days.    Marland Kitchen losartan (COZAAR) 50 MG tablet Take 1 tablet (50 mg total) by mouth daily. 30 tablet 5  . metFORMIN (GLUCOPHAGE) 500 MG tablet Take 1 tablet (500 mg total) by mouth 2 (two) times daily  with a meal. 60 tablet 11  . metoCLOPramide (REGLAN) 10 MG tablet Take 1 tablet (10 mg total) by mouth every 8 (eight) hours as needed. 60 tablet 1  . naproxen (NAPROSYN) 500 MG tablet Take 1 tablet (500 mg total) by mouth 2 (two) times daily with a meal. 60 tablet 2  . oxyCODONE (OXY IR/ROXICODONE) 5 MG immediate release tablet Take 1 tablet (5 mg total) by mouth every 6 (six) hours as needed for severe pain. 120 tablet 0  . tiZANidine (ZANAFLEX) 4 MG tablet TAKE ONE (1) TABLET BY MOUTH 3 TIMES DAILY 90 tablet 3  . topiramate (TOPAMAX) 50 MG tablet Take 2 tablets (100 mg total) by mouth at bedtime. 60 tablet 12  . fluticasone (FLONASE) 50 MCG/ACT nasal spray Place 2 sprays into both nostrils daily. (Patient not taking: Reported on 10/30/2015) 16 g 12  . polyethylene glycol powder (GLYCOLAX) powder Take 17 g by mouth daily. 500 g 5   No current facility-administered medications for this visit.    OBJECTIVE: Young African American woman Who appears stated age 25 Vitals:   10/30/15 1319  BP: 115/63  Pulse: 89  Temp: 98.3 F (36.8 C)  Resp: 19     Body mass index is 46.24 kg/(m^2).    ECOG FS: 2   Filed Weights   10/30/15 1319  Weight: 269 lb 8 oz (122.244 kg)   Skin: warm, dry  HEENT: sclerae anicteric, conjunctivae pink, oropharynx clear. No thrush or mucositis.  Lymph Nodes: No cervical or supraclavicular lymphadenopathy  Lungs: clear to auscultation bilaterally, no rales, wheezes, or rhonci  Heart: regular rate and rhythm  Abdomen: round, soft, non tender, positive bowel sounds  Musculoskeletal: No focal spinal tenderness, +1 bilateral lower extremity edema  Neuro: non focal, well oriented, positive affect  Breasts: right breast status post mastectomy and implant reconstruction. No evidence of recurrent disease. Right axilla benign. Left breast unremarkable.  LAB RESULTS: Lab Results  Component Value Date   WBC 9.8 09/18/2015   NEUTROABS 5.5 09/18/2015   HGB 12.8  09/18/2015   HCT 37.7 09/18/2015   MCV 83.6 09/18/2015   PLT 340 09/18/2015      Chemistry      Component Value Date/Time   NA 141 10/02/2015 1121   NA 138 10/02/2014 0854   K 3.4* 10/02/2015 1121   K 4.0 10/02/2014 0854   CL 103 10/02/2014 0854   CL 105 09/27/2012 1044   CO2 27 10/02/2015 1121   CO2 25 10/02/2014 0854   BUN 12.1 10/02/2015 1121   BUN 13 10/02/2014  0854   CREATININE 0.8 10/02/2015 1121   CREATININE 0.80 08/14/2015 1209   CREATININE 0.59 10/02/2014 0854      Component Value Date/Time   CALCIUM 9.8 10/02/2015 1121   CALCIUM 9.1 10/02/2014 0854   ALKPHOS 83 10/02/2015 1121   ALKPHOS 65 11/24/2011 0903   AST 13 10/02/2015 1121   AST 13 11/24/2011 0903   ALT 20 10/02/2015 1121   ALT 13 11/24/2011 0903   BILITOT <0.30 10/02/2015 1121   BILITOT 0.1* 11/24/2011 0903     STUDIES: Mr Mrv Head Wo Cm  10/06/2015   [facility] [address] [place], [place] [address] [phone] NEUROIMAGING REPORT STUDY DATE: 10/06/2015 PATIENT NAME: Karen Hopkins DOB: 14-Mar-1976 MRN: [id number] EXAM: Intracranial MR venogram ORDERING CLINICIAN: Sarina Ill M.D. CLINICAL HISTORY: 40 year old woman with superior ophthalmic vein enlargement and papilledema COMPARISON FILMS: none TECHNIQUE: MR venogram of the head was obtained utilizing 2D-TOF sequences from the skull base to the vertex.  Computerized reconstructions were obtained. CONTRAST: None IMAGING SITE: [facility], [address] [place], [place], [place] FINDINGS: This study is of adequate technical quality.  Flow signal of the superior sagittal sinus has no stenosis.  The right transverse and sigmoid sinuses and right internal jugular vein have no stenosis. The left transverse and sigmoid sinuses and left internal jugular vein have no stenosis.  The deep cerebral veins, great vein of Galen and straight sinus have no stenosis.   10/06/2015   This is a normal MR venogram of the intracranial veins and  sinuses. INTERPRETING PHYSICIAN: Richard A. Felecia Shelling, MD, PhD Certified in  [place] by [place] of Neuroimaging   ASSESSMENT: 39 y.o.  BRCA1 and BRCA2 negative [place] woman, status post right breast biopsy in March 2011 for a high-grade invasive ductal carcinoma, ER positive 3%, PR positive 14%, strongly HER2/neu positive with MIB-1 of 15%. Biopsy proven lymph node involvement at presentation. Changes consistent with inflammatory carcinoma.   1. Treated neoadjuvantly, status post 4 dose-dense cycles of doxorubicin/cyclophosphamide, followed by 12 weekly doses of paclitaxel with trastuzumab completed September 2011.   2. Trastuzumab was then continued for a total of 1 year [to June 2012]. Post-treatment echo showed a well-preserved ejection fraction.   3. Status post right modified radical mastectomy in October 2011 showing a ypT1a ypN1 invasive ductal carcinoma, grade 3.  Status post right reconstruction with implant, March 2015   4. postmastectomy radiation completed in January 2012  5. on tamoxifen beginning January of 2012 stopped somewhere in Spring/Summer 2016  (a) anastrozole started November 2016.  6. other problems include chronic postsurgical pain, chronic right upper extremity lymphedema, and chronic depression  7. Goserelin started 09/24/2014, to be repeated every 4 weeks  PLAN: Ayonna is doing well as far as her breast cancer is concerned. She is now 5 years out from her right mastectomy with no evidence of recurrent disease. She is overdue for a repeat left mammogram so I am having that rearranged for this month. She is tolerating the anastrozole well and is expected to continue this drug until she reaches 10 years of antiestrogen therapy. She will proceed with her next goserelin injection this afternoon.   She did not have labs drawn before this visit, so I am sending her back to the phlebotomist to have this completed. She has a chronically low potassium level and  has not been on the supplement since her cardiologist switch around her antihypertensives.   Jameila will have goserelin injections monthly, and return in  September for follow up with Dr. Jana Hakim. She understands and agrees with this plan. She knows the goal of treatment in her case is cure. She has been encouraged to call with any issues that might arise before her next visit here.  Laurie Panda, NP  10/30/2015

## 2015-10-30 NOTE — Patient Instructions (Signed)
Goserelin injection What is this medicine? GOSERELIN (GOE se rel in) is similar to a hormone found in the body. It lowers the amount of sex hormones that the body makes. Men will have lower testosterone levels and women will have lower estrogen levels while taking this medicine. In men, this medicine is used to treat prostate cancer; the injection is either given once per month or once every 12 weeks. A once per month injection (only) is used to treat women with endometriosis, dysfunctional uterine bleeding, or advanced breast cancer. This medicine may be used for other purposes; ask your health care provider or pharmacist if you have questions. What should I tell my health care provider before I take this medicine? They need to know if you have any of these conditions (some only apply to women): -diabetes -heart disease or previous heart attack -high blood pressure -high cholesterol -kidney disease -osteoporosis or low bone density -problems passing urine -spinal cord injury -stroke -tobacco smoker -an unusual or allergic reaction to goserelin, hormone therapy, other medicines, foods, dyes, or preservatives -pregnant or trying to get pregnant -breast-feeding How should I use this medicine? This medicine is for injection under the skin. It is given by a health care professional in a hospital or clinic setting. Men receive this injection once every 4 weeks or once every 12 weeks. Women will only receive the once every 4 weeks injection. Talk to your pediatrician regarding the use of this medicine in children. Special care may be needed. Overdosage: If you think you have taken too much of this medicine contact a poison control center or emergency room at once. NOTE: This medicine is only for you. Do not share this medicine with others. What if I miss a dose? It is important not to miss your dose. Call your doctor or health care professional if you are unable to keep an appointment. What may  interact with this medicine? -female hormones like estrogen -herbal or dietary supplements like black cohosh, chasteberry, or DHEA -female hormones like testosterone -prasterone This list may not describe all possible interactions. Give your health care provider a list of all the medicines, herbs, non-prescription drugs, or dietary supplements you use. Also tell them if you smoke, drink alcohol, or use illegal drugs. Some items may interact with your medicine. What should I watch for while using this medicine? Visit your doctor or health care professional for regular checks on your progress. Your symptoms may appear to get worse during the first weeks of this therapy. Tell your doctor or healthcare professional if your symptoms do not start to get better or if they get worse after this time. Your bones may get weaker if you take this medicine for a long time. If you smoke or frequently drink alcohol you may increase your risk of bone loss. A family history of osteoporosis, chronic use of drugs for seizures (convulsions), or corticosteroids can also increase your risk of bone loss. Talk to your doctor about how to keep your bones strong. This medicine should stop regular monthly menstration in women. Tell your doctor if you continue to menstrate. Women should not become pregnant while taking this medicine or for 12 weeks after stopping this medicine. Women should inform their doctor if they wish to become pregnant or think they might be pregnant. There is a potential for serious side effects to an unborn child. Talk to your health care professional or pharmacist for more information. Do not breast-feed an infant while taking this medicine. Men should   inform their doctors if they wish to father a child. This medicine may lower sperm counts. Talk to your health care professional or pharmacist for more information. What side effects may I notice from receiving this medicine? Side effects that you should  report to your doctor or health care professional as soon as possible: -allergic reactions like skin rash, itching or hives, swelling of the face, lips, or tongue -bone pain -breathing problems -changes in vision -chest pain -feeling faint or lightheaded, falls -fever, chills -pain, swelling, warmth in the leg -pain, tingling, numbness in the hands or feet -signs and symptoms of low blood pressure like dizziness; feeling faint or lightheaded, falls; unusually weak or tired -stomach pain -swelling of the ankles, feet, hands -trouble passing urine or change in the amount of urine -unusually high or low blood pressure -unusually weak or tired Side effects that usually do not require medical attention (report to your doctor or health care professional if they continue or are bothersome): -change in sex drive or performance -changes in breast size in both males and females -changes in emotions or moods -headache -hot flashes -irritation at site where injected -loss of appetite -skin problems like acne, dry skin -vaginal dryness This list may not describe all possible side effects. Call your doctor for medical advice about side effects. You may report side effects to FDA at 1-800-FDA-1088. Where should I keep my medicine? This drug is given in a hospital or clinic and will not be stored at home. NOTE: This sheet is a summary. It may not cover all possible information. If you have questions about this medicine, talk to your doctor, pharmacist, or health care provider.    2016, Elsevier/Gold Standard. (2013-07-31 11:10:35)  

## 2015-11-04 ENCOUNTER — Other Ambulatory Visit (INDEPENDENT_AMBULATORY_CARE_PROVIDER_SITE_OTHER): Payer: Medicare Other | Admitting: *Deleted

## 2015-11-04 DIAGNOSIS — Z79899 Other long term (current) drug therapy: Secondary | ICD-10-CM

## 2015-11-04 LAB — BASIC METABOLIC PANEL
BUN: 14 mg/dL (ref 7–25)
CALCIUM: 9 mg/dL (ref 8.6–10.2)
CO2: 22 mmol/L (ref 20–31)
CREATININE: 0.82 mg/dL (ref 0.50–1.10)
Chloride: 108 mmol/L (ref 98–110)
Glucose, Bld: 99 mg/dL (ref 65–99)
Potassium: 3.2 mmol/L — ABNORMAL LOW (ref 3.5–5.3)
Sodium: 140 mmol/L (ref 135–146)

## 2015-11-04 NOTE — Addendum Note (Signed)
Addended by: Eulis Foster on: 11/04/2015 12:13 PM   Modules accepted: Orders

## 2015-11-04 NOTE — Progress Notes (Signed)
Cardiology Office Note   Date:  11/05/2015   ID:  Karen Hopkins, DOB 11-Jan-1976, MRN [id number]  PCP:  Chrisandra Netters, MD  Cardiologist:   Skeet Latch, MD   Chief Complaint  Patient presents with  . Follow-up    dizziness; ocassionally. edema; hands, feet arms, legs.    History of Present Illness: Karen Hopkins is a 40 y.o. female with breast cancer s/p mastectomy, chemotherapy (4 cycles of doxorubicin and cyclophosphamide, 12 cycles of paclitaxel and trastuzumab) diabetes who presents for follow-up.  She presented 5/16 with shortness of breath.  She had an echo that revealed LVEF 55-60% and normal diastolic function. Her global longitudinal strain was -16.2%, which is slightly higher (worse) than her prior, which was -20%.  At that appointment Maxzide was discontinued and she was started on losartan and carvedilol.  She noted lower extremity edema so lasix was increased.  When she lays down she feels more bloated.  She hasn't noted any orthopnea, PND, or shortness of breath with ambulation.  Her weight increased 15lb after stopping Maxzide.  After increasing her lasix the swelling is improving but she still notes some bloating in her abdomen.  She feels like her BP is better controlled since making these changes.   She tried to start exercising by jogging but hurt her leg.  She plans to start back exercising soon.  Past Medical History  Diagnosis Date  . Seasonal allergies   . Lymphedema of arm     right; no BP or puncture to right arm  . Depression   . Anxiety   . Sinus headache   . Hypertension     under control with med., has been on med. x 2 yr.  . History of breast cancer 2011    right  . History of chemotherapy 2011  . History of radiation therapy 2011  . Cancer ([place])   . Essential hypertension 11/05/2015    Past Surgical History  Procedure Laterality Date  . Supra-umbilical hernia  2355  . Nasal septum surgery    . Latissimus flap to breast Right  03/12/2013    Procedure: RIGHT BREAST LATISSIMUS FLAP WITH EXPANDER PLACEMENT;  Surgeon: Theodoro Kos, DO;  Location: [facility];  Service: Plastics;  Laterality: Right;  . Cesarean section  07/15/2001; 03/18/2004; 11/21/2008  . Tubal ligation  11/21/2008  . Portacath placement Left 09/12/2009  . Modified radical mastectomy Right 03/11/2010  . Port-a-cath removal Left 12/01/2010  . Removal of tissue expander and placement of implant Right 08/23/2013    Procedure: REMOVAL RIGHT TISSUE EXPANDER AND PLACEMENT OF IMPLANT TO RIGHT BREAST ;  Surgeon: Theodoro Kos, DO;  Location: [facility];  Service: Plastics;  Laterality: Right;  . Breast reduction surgery Left 08/23/2013    Procedure: LEFT BREAST REDUCTION  ;  Surgeon: Theodoro Kos, DO;  Location: [place] [facility];  Service: Plastics;  Laterality: Left;  . Liposuction Bilateral 08/23/2013    Procedure: LIPOSUCTION;  Surgeon: Theodoro Kos, DO;  Location: [facility];  Service: Plastics;  Laterality: Bilateral;     Current Outpatient Prescriptions  Medication Sig Dispense Refill  . ACCU-CHEK SMARTVIEW test strip USE TO CHECK BLOOD SUGAR LEVELS FOUR TIMES DAILY AS DIRECTED 100 each 3  . ALPRAZolam (ALPRAZOLAM XR) 1 MG 24 hr tablet Take 1 tablet (1 mg total) by mouth daily. 30 tablet 2  . anastrozole (ARIMIDEX) 1 MG tablet Take 1 tablet (1 mg total) by mouth daily. 90 tablet  4  . blood glucose meter kit and supplies KIT Dispense based on patient and insurance preference. Use up to four times daily as directed. (FOR ICD-9 250.00, 250.01). 1 each 0  . carvedilol (COREG) 6.25 MG tablet Take 1 tablet (6.25 mg total) by mouth 2 (two) times daily with a meal. 60 tablet 5  . citalopram (CELEXA) 20 MG tablet TAKE ONE (1) TABLET BY MOUTH EVERY DAY 30 tablet 6  . fluticasone (FLONASE) 50 MCG/ACT nasal spray Place 2 sprays into both nostrils daily. 16 g 12  . furosemide (LASIX) 40 MG tablet Take 1 tablet (40 mg total) by mouth daily.  90 tablet 3  . goserelin (ZOLADEX) 3.6 MG injection Inject 3.6 mg into the skin every 28 (twenty-eight) days.    Marland Kitchen losartan (COZAAR) 50 MG tablet Take 1 tablet (50 mg total) by mouth daily. 30 tablet 5  . metFORMIN (GLUCOPHAGE) 500 MG tablet Take 1 tablet (500 mg total) by mouth 2 (two) times daily with a meal. 60 tablet 11  . metoCLOPramide (REGLAN) 10 MG tablet Take 1 tablet (10 mg total) by mouth every 8 (eight) hours as needed. 60 tablet 1  . naproxen (NAPROSYN) 500 MG tablet Take 1 tablet (500 mg total) by mouth 2 (two) times daily with a meal. 60 tablet 2  . oxyCODONE (OXY IR/ROXICODONE) 5 MG immediate release tablet Take 1 tablet (5 mg total) by mouth every 6 (six) hours as needed for severe pain. 120 tablet 0  . polyethylene glycol powder (GLYCOLAX) powder Take 17 g by mouth daily. 500 g 5  . potassium chloride SA (K-DUR,KLOR-CON) 20 MEQ tablet Take 20 mEq by mouth daily.    Marland Kitchen tiZANidine (ZANAFLEX) 4 MG tablet TAKE ONE (1) TABLET BY MOUTH 3 TIMES DAILY 90 tablet 3  . topiramate (TOPAMAX) 50 MG tablet Take 2 tablets (100 mg total) by mouth at bedtime. 60 tablet 12   No current facility-administered medications for this visit.    Allergies:   Penicillins; Lyrica; Meloxicam; and Robaxin    Social History:  The patient  reports that she quit smoking about 5 years ago. She has never used smokeless tobacco. She reports that she does not drink alcohol or use illicit drugs.   Family History:  The patient's family history includes Diabetes type II in her father; Hypertension in her mother; Prostate cancer in her father. There is no history of Stroke.    ROS:  Please see the history of present illness.   Otherwise, review of systems are positive for none.   All other systems are reviewed and negative.    PHYSICAL EXAM: VS:  BP 116/74 mmHg  Pulse 85  Ht 5' 4"  (1.626 m)  Wt 260 lb (117.935 kg)  BMI 44.61 kg/m2 , BMI Body mass index is 44.61 kg/(m^2). GENERAL:  Well appearing HEENT:   Pupils equal round and reactive, fundi not visualized, oral mucosa unremarkable NECK:  No jugular venous distention, waveform within normal limits, carotid upstroke brisk and symmetric, no bruits LYMPHATICS:  No cervical adenopathy LUNGS:  Clear to auscultation bilaterally HEART:  RRR.  PMI not displaced or sustained,S1 and S2 within normal limits, no S3, no S4, no clicks, no rubs, no murmurs ABD:  Flat, positive bowel sounds normal in frequency in pitch, no bruits, no rebound, no guarding, no midline pulsatile mass, no hepatomegaly, no splenomegaly EXT:  2 plus pulses throughout, no edema, no cyanosis no clubbing SKIN:  No rashes no nodules NEURO:  Cranial nerves II  through XII grossly intact, motor grossly intact throughout [facility]:  Cognitively intact, oriented to [place] place and time   EKG:  EKG is not ordered today. The ekg ordered 10/01/14 demonstrates sinus rhythm rate 79 bpm.    Recent Labs: 09/17/2015: TSH 1.390 10/30/2015: ALT 16; HGB 11.7; Platelets 279 11/04/2015: BUN 14; Creat 0.82; Potassium 3.2*; Sodium 140    Lipid Panel    Component Value Date/Time   CHOL 100 09/17/2015 0817   CHOL 87 10/02/2014 0854   TRIG 105 09/17/2015 0817   HDL 24* 09/17/2015 0817   HDL 23* 10/02/2014 0854   CHOLHDL 4.2 09/17/2015 0817   CHOLHDL 3.8 10/02/2014 0854   VLDL 13 10/02/2014 0854   LDLCALC 55 09/17/2015 0817   LDLCALC 51 10/02/2014 0854      Wt Readings from Last 3 Encounters:  11/05/15 260 lb (117.935 kg)  10/30/15 269 lb 8 oz (122.244 kg)  10/21/15 254 lb 12.8 oz (115.577 kg)      ASSESSMENT AND PLAN:  # Hypertension: # Hypokalemia: # LE edema:  Ms. Gorley's BP is well-controlled.  She continues to have hypokalemia after starting losartan. Given that she continues to have some lower extremity edema we will increase Lasix to 40 mg twice daily for 3 days followed by 40 mg daily after that. During the time and she is taking extra Lasix she will take potassium chloride 20  mEq twice daily. Once she is Lasix to daily she will start taking her potassium 20 mEq daily. She will return for a basic metabolic panel 2 weeks.  Continue carvedilol losartan.  # GLS: Mildly abnormal but systolic and diastolic were normal.   She is doing well on carvedilol and losartan.  Repeat echo 04/2016.   Current medicines are reviewed at length with the patient today.  The patient does not have concerns regarding medicines.  The following changes have been made:  no change  Labs/ tests ordered today include:   Orders Placed This Encounter  Procedures  . Basic metabolic panel     Disposition:   FU with Niraj Kudrna C. Oval Linsey, MD, [facility] in 6 months.  This note was written with the assistance of speech recognition software.  Please excuse any transcriptional errors.  Signed, Aaryn Sermon C. Oval Linsey, MD, [facility]  11/05/2015 1:46 PM    [facility]

## 2015-11-05 ENCOUNTER — Encounter: Payer: Self-pay | Admitting: Cardiovascular Disease

## 2015-11-05 ENCOUNTER — Ambulatory Visit (INDEPENDENT_AMBULATORY_CARE_PROVIDER_SITE_OTHER): Payer: Medicare Other | Admitting: Cardiovascular Disease

## 2015-11-05 VITALS — BP 116/74 | HR 85 | Ht 64.0 in | Wt 260.0 lb

## 2015-11-05 DIAGNOSIS — I676 Nonpyogenic thrombosis of intracranial venous system: Secondary | ICD-10-CM | POA: Diagnosis not present

## 2015-11-05 DIAGNOSIS — R600 Localized edema: Secondary | ICD-10-CM

## 2015-11-05 DIAGNOSIS — I10 Essential (primary) hypertension: Secondary | ICD-10-CM | POA: Diagnosis not present

## 2015-11-05 DIAGNOSIS — E876 Hypokalemia: Secondary | ICD-10-CM

## 2015-11-05 HISTORY — DX: Essential (primary) hypertension: I10

## 2015-11-05 NOTE — Patient Instructions (Signed)
Medication Instructions:  TAKE YOUR FUROSEMIDE 40 MG TWICE A DAY UNTIL Saturday AND THEN JUST ONCE DAILY  START YOUR POTASSIUM 20 MEQ TWICE A DAY UNTIL Saturday AND THEN JUST ONCE DAILY  Labwork: BMET IN 2 WEEKS AT [facility] ON THE FIRST FLOOR  Testing/Procedures: NONE  Follow-Up: Your physician wants you to follow-up in: [place] will receive a reminder letter in the mail two months in advance. If you don't receive a letter, please call our office to schedule the follow-up appointment.  If you need a refill on your cardiac medications before your next appointment, please call your pharmacy.

## 2015-11-06 HISTORY — PX: OTHER SURGICAL HISTORY: SHX169

## 2015-11-10 NOTE — Addendum Note (Signed)
Addended by: Therisa Doyne on: 11/10/2015 12:41 PM   Modules accepted: Orders

## 2015-11-15 LAB — BASIC METABOLIC PANEL
BUN: 11 mg/dL (ref 7–25)
CO2: 26 mmol/L (ref 20–31)
CREATININE: 0.75 mg/dL (ref 0.50–1.10)
Calcium: 9.2 mg/dL (ref 8.6–10.2)
Chloride: 107 mmol/L (ref 98–110)
GLUCOSE: 125 mg/dL — AB (ref 65–99)
POTASSIUM: 3.6 mmol/L (ref 3.5–5.3)
Sodium: 139 mmol/L (ref 135–146)

## 2015-11-17 ENCOUNTER — Other Ambulatory Visit: Payer: Self-pay | Admitting: Physical Medicine & Rehabilitation

## 2015-11-25 ENCOUNTER — Ambulatory Visit: Payer: Medicare Other | Admitting: Physical Medicine & Rehabilitation

## 2015-11-27 ENCOUNTER — Ambulatory Visit: Payer: Medicare Other

## 2015-12-01 ENCOUNTER — Telehealth: Payer: Self-pay | Admitting: Oncology

## 2015-12-01 ENCOUNTER — Ambulatory Visit (HOSPITAL_BASED_OUTPATIENT_CLINIC_OR_DEPARTMENT_OTHER): Payer: Medicare Other

## 2015-12-01 VITALS — BP 109/77 | HR 85 | Temp 98.0°F | Resp 20

## 2015-12-01 DIAGNOSIS — C50411 Malignant neoplasm of upper-outer quadrant of right female breast: Secondary | ICD-10-CM | POA: Diagnosis not present

## 2015-12-01 DIAGNOSIS — Z5111 Encounter for antineoplastic chemotherapy: Secondary | ICD-10-CM | POA: Diagnosis present

## 2015-12-01 DIAGNOSIS — C50911 Malignant neoplasm of unspecified site of right female breast: Secondary | ICD-10-CM

## 2015-12-01 MED ORDER — GOSERELIN ACETATE 3.6 MG ~~LOC~~ IMPL
3.6000 mg | DRUG_IMPLANT | Freq: Once | SUBCUTANEOUS | Status: AC
  Administered 2015-12-01: 3.6 mg via SUBCUTANEOUS
  Filled 2015-12-01: qty 3.6

## 2015-12-01 NOTE — Telephone Encounter (Signed)
Added inj per inj nurse, pt aware

## 2015-12-01 NOTE — Patient Instructions (Signed)
Goserelin injection What is this medicine? GOSERELIN (GOE se rel in) is similar to a hormone found in the body. It lowers the amount of sex hormones that the body makes. Men will have lower testosterone levels and women will have lower estrogen levels while taking this medicine. In men, this medicine is used to treat prostate cancer; the injection is either given once per month or once every 12 weeks. A once per month injection (only) is used to treat women with endometriosis, dysfunctional uterine bleeding, or advanced breast cancer. This medicine may be used for other purposes; ask your health care provider or pharmacist if you have questions. What should I tell my health care provider before I take this medicine? They need to know if you have any of these conditions (some only apply to women): -diabetes -heart disease or previous heart attack -high blood pressure -high cholesterol -kidney disease -osteoporosis or low bone density -problems passing urine -spinal cord injury -stroke -tobacco smoker -an unusual or allergic reaction to goserelin, hormone therapy, other medicines, foods, dyes, or preservatives -pregnant or trying to get pregnant -breast-feeding How should I use this medicine? This medicine is for injection under the skin. It is given by a health care professional in a hospital or clinic setting. Men receive this injection once every 4 weeks or once every 12 weeks. Women will only receive the once every 4 weeks injection. Talk to your pediatrician regarding the use of this medicine in children. Special care may be needed. Overdosage: If you think you have taken too much of this medicine contact a poison control center or emergency room at once. NOTE: This medicine is only for you. Do not share this medicine with others. What if I miss a dose? It is important not to miss your dose. Call your doctor or health care professional if you are unable to keep an appointment. What may  interact with this medicine? -female hormones like estrogen -herbal or dietary supplements like black cohosh, chasteberry, or DHEA -female hormones like testosterone -prasterone This list may not describe all possible interactions. Give your health care provider a list of all the medicines, herbs, non-prescription drugs, or dietary supplements you use. Also tell them if you smoke, drink alcohol, or use illegal drugs. Some items may interact with your medicine. What should I watch for while using this medicine? Visit your doctor or health care professional for regular checks on your progress. Your symptoms may appear to get worse during the first weeks of this therapy. Tell your doctor or healthcare professional if your symptoms do not start to get better or if they get worse after this time. Your bones may get weaker if you take this medicine for a long time. If you smoke or frequently drink alcohol you may increase your risk of bone loss. A family history of osteoporosis, chronic use of drugs for seizures (convulsions), or corticosteroids can also increase your risk of bone loss. Talk to your doctor about how to keep your bones strong. This medicine should stop regular monthly menstration in women. Tell your doctor if you continue to menstrate. Women should not become pregnant while taking this medicine or for 12 weeks after stopping this medicine. Women should inform their doctor if they wish to become pregnant or think they might be pregnant. There is a potential for serious side effects to an unborn child. Talk to your health care professional or pharmacist for more information. Do not breast-feed an infant while taking this medicine. Men should   inform their doctors if they wish to father a child. This medicine may lower sperm counts. Talk to your health care professional or pharmacist for more information. What side effects may I notice from receiving this medicine? Side effects that you should  report to your doctor or health care professional as soon as possible: -allergic reactions like skin rash, itching or hives, swelling of the face, lips, or tongue -bone pain -breathing problems -changes in vision -chest pain -feeling faint or lightheaded, falls -fever, chills -pain, swelling, warmth in the leg -pain, tingling, numbness in the hands or feet -signs and symptoms of low blood pressure like dizziness; feeling faint or lightheaded, falls; unusually weak or tired -stomach pain -swelling of the ankles, feet, hands -trouble passing urine or change in the amount of urine -unusually high or low blood pressure -unusually weak or tired Side effects that usually do not require medical attention (report to your doctor or health care professional if they continue or are bothersome): -change in sex drive or performance -changes in breast size in both males and females -changes in emotions or moods -headache -hot flashes -irritation at site where injected -loss of appetite -skin problems like acne, dry skin -vaginal dryness This list may not describe all possible side effects. Call your doctor for medical advice about side effects. You may report side effects to FDA at 1-800-FDA-1088. Where should I keep my medicine? This drug is given in a hospital or clinic and will not be stored at home. NOTE: This sheet is a summary. It may not cover all possible information. If you have questions about this medicine, talk to your doctor, pharmacist, or health care provider.    2016, Elsevier/Gold Standard. (2013-07-31 11:10:35)  

## 2015-12-01 NOTE — Telephone Encounter (Signed)
Due to 6/22 inj missed and r/s'd to 6/26 patient stopped by today to have subsequent injections moved to be 4 weeks apart. Next injection for 7/20 moved to 7/25, then 8/22 and 9/19 (w/fu). Patient given new schedule for July thru September.

## 2015-12-16 ENCOUNTER — Ambulatory Visit: Payer: Medicare Other | Admitting: Physical Medicine & Rehabilitation

## 2015-12-16 ENCOUNTER — Encounter: Payer: Medicare Other | Attending: Registered Nurse | Admitting: Registered Nurse

## 2015-12-16 ENCOUNTER — Encounter: Payer: Self-pay | Admitting: Registered Nurse

## 2015-12-16 VITALS — BP 117/83 | HR 93 | Resp 18

## 2015-12-16 DIAGNOSIS — M545 Low back pain, unspecified: Secondary | ICD-10-CM

## 2015-12-16 DIAGNOSIS — I676 Nonpyogenic thrombosis of intracranial venous system: Secondary | ICD-10-CM

## 2015-12-16 DIAGNOSIS — Z5181 Encounter for therapeutic drug level monitoring: Secondary | ICD-10-CM

## 2015-12-16 DIAGNOSIS — M546 Pain in thoracic spine: Secondary | ICD-10-CM | POA: Diagnosis present

## 2015-12-16 DIAGNOSIS — G243 Spasmodic torticollis: Secondary | ICD-10-CM | POA: Diagnosis not present

## 2015-12-16 DIAGNOSIS — M797 Fibromyalgia: Secondary | ICD-10-CM

## 2015-12-16 DIAGNOSIS — I890 Lymphedema, not elsewhere classified: Secondary | ICD-10-CM | POA: Insufficient documentation

## 2015-12-16 DIAGNOSIS — G894 Chronic pain syndrome: Secondary | ICD-10-CM

## 2015-12-16 DIAGNOSIS — M7918 Myalgia, other site: Secondary | ICD-10-CM

## 2015-12-16 DIAGNOSIS — G893 Neoplasm related pain (acute) (chronic): Secondary | ICD-10-CM | POA: Diagnosis not present

## 2015-12-16 DIAGNOSIS — Z79899 Other long term (current) drug therapy: Secondary | ICD-10-CM

## 2015-12-16 MED ORDER — OXYCODONE HCL 5 MG PO TABS: 5.0000 mg | ORAL_TABLET | Freq: Four times a day (QID) | ORAL | Status: DC | PRN

## 2015-12-16 MED ORDER — TIZANIDINE HCL 4 MG PO TABS: ORAL_TABLET | ORAL | Status: DC

## 2015-12-16 NOTE — Progress Notes (Signed)
Subjective:    Patient ID: Karen Hopkins, female    DOB: [birth year], 41 y.o.   MRN: [id number]  HPI: [name] is a 40 year old female who returns for follow up for chronic pain and medication refill. She states her pain is located in her neck,right shoulder and mid- lower back.She rates her pain 8. Her current exercise regime is walking and performing stretching exercises. Also states she had eye [facility] on December 02, 2015 by Dr. Toy Cookey.  Pain Inventory Average Pain 7 Pain Right Now 8 My pain is sharp, burning, dull, stabbing, tingling and aching  In the last 24 hours, has pain interfered with the following? General activity 6 Relation with others 6 Enjoyment of life 6 What TIME of day is your pain at its worst? morning, daytime, evening, night Sleep (in general) Poor  Pain is worse with: walking, bending, sitting, inactivity and some activites Pain improves with: rest, therapy/exercise, medication and injections Relief from Meds: 4  Mobility walk without assistance do you drive?  yes  Function disabled: date disabled NA  Neuro/Psych numbness tingling spasms depression anxiety  Prior Studies Any changes since last visit?  yes CT/MRI  Physicians involved in your care Any changes since last visit?  no   Family History  Problem Relation Age of Onset  . Hypertension Mother   . Diabetes type II Father   . Prostate cancer Father   . Stroke Neg Hx    Social History   Social History  . Marital Status: Single    Spouse Name: N/A  . Number of Children: 3  . Years of Education: 16   Occupational History  . Disability     Social History Main Topics  . Smoking status: Former Smoker -- 0.25 packs/day for 5 years    Quit date: 04/26/2010  . Smokeless tobacco: Never Used  . Alcohol Use: No  . Drug Use: No  . Sexual Activity: Not Currently    Birth Control/ Protection: Surgical   Other Topics Concern  . None   Social History  Narrative   Lives with kids   Caffeine use: none    Past Surgical History  Procedure Laterality Date  . Supra-umbilical hernia  123456  . Nasal septum surgery    . Latissimus flap to breast Right 03/12/2013    Procedure: RIGHT BREAST LATISSIMUS FLAP WITH EXPANDER PLACEMENT;  Surgeon: Theodoro Kos, DO;  Location: [facility];  Service: Plastics;  Laterality: Right;  . Cesarean section  07/15/2001; 03/18/2004; 11/21/2008  . Tubal ligation  11/21/2008  . Portacath placement Left 09/12/2009  . Modified radical mastectomy Right 03/11/2010  . Port-a-cath removal Left 12/01/2010  . Removal of tissue expander and placement of implant Right 08/23/2013    Procedure: REMOVAL RIGHT TISSUE EXPANDER AND PLACEMENT OF IMPLANT TO RIGHT BREAST ;  Surgeon: Theodoro Kos, DO;  Location: [place];  Service: Plastics;  Laterality: Right;  . Breast reduction surgery Left 08/23/2013    Procedure: LEFT BREAST REDUCTION  ;  Surgeon: Theodoro Kos, DO;  Location: [place];  Service: Plastics;  Laterality: Left;  . Liposuction Bilateral 08/23/2013    Procedure: LIPOSUCTION;  Surgeon: Theodoro Kos, DO;  Location: [place];  Service: Plastics;  Laterality: Bilateral;   Past Medical History  Diagnosis Date  . Seasonal allergies   . Lymphedema of arm     right; no BP or puncture to right arm  . Depression   .  Anxiety   . Sinus headache   . Hypertension     under control with med., has been on med. x 2 yr.  . History of breast cancer 2011    right  . History of chemotherapy 2011  . History of radiation therapy 2011  . Cancer ([place])   . Essential hypertension 11/05/2015   BP 117/83 mmHg  Pulse 93  Resp 18  SpO2 96%  Opioid Risk Score:   Fall Risk Score:  `1  Depression screen PHQ 2/9  Depression screen [facility] 2/9 08/04/2015 06/25/2015 03/04/2015 01/20/2015 01/07/2015 11/21/2014 10/07/2014  Decreased Interest 0 0 0 0 0 0 0  Down, Depressed, Hopeless 0 0 0 0 0 0 0  PHQ - 2 Score 0  0 0 0 0 0 0  Altered sleeping - - - - - - -  Tired, decreased energy - - - - - - -  Change in appetite - - - - - - -  Feeling bad or failure about yourself  - - - - - - -  Trouble concentrating - - - - - - -  Moving slowly or fidgety/restless - - - - - - -  Suicidal thoughts - - - - - - -  PHQ-9 Score - - - - - - -     Review of Systems  Constitutional: Positive for unexpected weight change.  Gastrointestinal: Positive for constipation.  Neurological: Positive for numbness.       Tingling  Spasms   Psychiatric/Behavioral: Positive for dysphoric mood. The patient is nervous/anxious.   All other systems reviewed and are negative.      Objective:   Physical Exam  Constitutional: She is oriented to [place], place, and time. She appears well-developed and well-nourished.  HENT:  Head: Normocephalic and atraumatic.  Neck: Normal range of motion. Neck supple.  Cervical Paraspinal Tenderness: C-4- C- 6  Cardiovascular: Normal rate and regular rhythm.   Pulmonary/Chest: Effort normal and breath sounds normal.  Musculoskeletal:  Normal Muscle Bulk and Muscle Testing Reveals: Upper Extremities: Right: Decreased ROM 45 Degrees and Muscle Strength 5/5 Left: Full ROM and Muscle Strength 5/5 Right AC Joint Tenderness Thoracic Paraspinal Tenderness: T-1- T-6 Mainly Right Side Lumbar Paraspinal Tenderness: L-3- L-5 Lower Extremities: Full ROM and Muscle Strength 5/5 Arises from Table with ease Narrow Based Gaiit  Neurological: She is alert and oriented to [place], place, and time.  Skin: Skin is warm and dry.  Psychiatric: She has a normal mood and affect.  Nursing note and vitals reviewed.         Assessment & Plan:  1.Myofascial pain syndrome chronic postoperative, as well as post radiation: S/P Breast Reconstruction Surgery x 2.  Refilled: Oxycodone 5 mg one tablet every 6 hours as needed for severe pain.# 120. Continue HEP. We will continue the opioid monitoring program, this  consists of regular clinic visits, examinations, urine drug screen, pill counts as well as use of [place] Controlled Substance Reporting System. 2. Neoplasm Related Pain: Bio Wave  Treatment. 3. Obesity: Losing weight continue with Healthy Diet and Exercise Regime.  4. Muscle Spasms: Continue Tizanidine  20 minutes of face to face patient care time was spent during this visit. All questions were encouraged and answered

## 2015-12-18 ENCOUNTER — Ambulatory Visit: Payer: Medicare Other

## 2015-12-18 ENCOUNTER — Ambulatory Visit: Payer: Medicare Other | Admitting: Nurse Practitioner

## 2015-12-18 ENCOUNTER — Other Ambulatory Visit: Payer: Medicare Other

## 2015-12-23 ENCOUNTER — Encounter (HOSPITAL_BASED_OUTPATIENT_CLINIC_OR_DEPARTMENT_OTHER): Payer: Medicare Other

## 2015-12-25 ENCOUNTER — Ambulatory Visit: Payer: Medicare Other

## 2015-12-26 LAB — TOXASSURE SELECT,+ANTIDEPR,UR: PDF: 0

## 2015-12-26 NOTE — Progress Notes (Signed)
Urine drug screen for this encounter is consistent for prescribed medications.   

## 2015-12-29 ENCOUNTER — Encounter: Payer: Self-pay | Admitting: Physical Medicine & Rehabilitation

## 2015-12-29 ENCOUNTER — Ambulatory Visit (HOSPITAL_BASED_OUTPATIENT_CLINIC_OR_DEPARTMENT_OTHER): Payer: Medicare Other | Admitting: Physical Medicine & Rehabilitation

## 2015-12-29 VITALS — BP 122/90 | HR 71 | Resp 16

## 2015-12-29 DIAGNOSIS — G243 Spasmodic torticollis: Secondary | ICD-10-CM | POA: Diagnosis not present

## 2015-12-29 DIAGNOSIS — I890 Lymphedema, not elsewhere classified: Secondary | ICD-10-CM | POA: Diagnosis not present

## 2015-12-29 NOTE — Patient Instructions (Signed)
50 units trapezius 50 units levator 75 units sternocleidomastoid 12.5 units platysma 12.5 units scalene  You received a Botox injection today. You may experience soreness at the needle injection sites. Please call us if any of the injection sites turns red after a couple days or if there is any drainage. You may experience muscle weakness as a result of Botox. This would improve with time but can take several weeks to improve. The Botox should start working in about one week. The Botox usually last 3 months. The injection can be repeated every 3 months as needed.

## 2015-12-29 NOTE — Progress Notes (Signed)
Botox Injection for spasticity using needle EMG guidance  Dilution: 50 Units/ml Indication: Severe spasticity which interferes with ADL,mobility and/or  hygiene and is unresponsive to medication management and other conservative care Informed consent was obtained after describing risks and benefits of the procedure with the patient. This includes bleeding, bruising, infection, excessive weakness, or medication side effects. A REMS form is on file and signed. Needle: 30g 1" needle electrode Number of units per muscle  50 units trapezius 75 units levator 50 units sternocleidomastoid 12.5 units platysma 12.5 units scalene All injections were done after obtaining appropriate EMG activity and after negative drawback for blood. The patient tolerated the procedure well. Post procedure instructions were given. A followup appointment was made.

## 2015-12-30 ENCOUNTER — Other Ambulatory Visit: Payer: Self-pay

## 2015-12-30 ENCOUNTER — Ambulatory Visit (HOSPITAL_BASED_OUTPATIENT_CLINIC_OR_DEPARTMENT_OTHER): Payer: Medicare Other

## 2015-12-30 VITALS — BP 107/70 | HR 85 | Temp 98.4°F | Resp 18

## 2015-12-30 DIAGNOSIS — C50911 Malignant neoplasm of unspecified site of right female breast: Secondary | ICD-10-CM

## 2015-12-30 DIAGNOSIS — C50811 Malignant neoplasm of overlapping sites of right female breast: Secondary | ICD-10-CM

## 2015-12-30 DIAGNOSIS — Z5111 Encounter for antineoplastic chemotherapy: Secondary | ICD-10-CM

## 2015-12-30 DIAGNOSIS — E876 Hypokalemia: Secondary | ICD-10-CM

## 2015-12-30 MED ORDER — ALPRAZOLAM ER 1 MG PO TB24: 1.0000 mg | ORAL_TABLET | Freq: Every day | ORAL | 2 refills | Status: DC

## 2015-12-30 MED ORDER — GOSERELIN ACETATE 3.6 MG ~~LOC~~ IMPL
3.6000 mg | DRUG_IMPLANT | Freq: Once | SUBCUTANEOUS | Status: AC
  Administered 2015-12-30: 3.6 mg via SUBCUTANEOUS
  Filled 2015-12-30: qty 3.6

## 2015-12-30 NOTE — Patient Instructions (Signed)
Goserelin injection What is this medicine? GOSERELIN (GOE se rel in) is similar to a hormone found in the body. It lowers the amount of sex hormones that the body makes. Men will have lower testosterone levels and women will have lower estrogen levels while taking this medicine. In men, this medicine is used to treat prostate cancer; the injection is either given once per month or once every 12 weeks. A once per month injection (only) is used to treat women with endometriosis, dysfunctional uterine bleeding, or advanced breast cancer. This medicine may be used for other purposes; ask your health care provider or pharmacist if you have questions. What should I tell my health care provider before I take this medicine? They need to know if you have any of these conditions (some only apply to women): -diabetes -heart disease or previous heart attack -high blood pressure -high cholesterol -kidney disease -osteoporosis or low bone density -problems passing urine -spinal cord injury -stroke -tobacco smoker -an unusual or allergic reaction to goserelin, hormone therapy, other medicines, foods, dyes, or preservatives -pregnant or trying to get pregnant -breast-feeding How should I use this medicine? This medicine is for injection under the skin. It is given by a health care professional in a hospital or clinic setting. Men receive this injection once every 4 weeks or once every 12 weeks. Women will only receive the once every 4 weeks injection. Talk to your pediatrician regarding the use of this medicine in children. Special care may be needed. Overdosage: If you think you have taken too much of this medicine contact a poison control center or emergency room at once. NOTE: This medicine is only for you. Do not share this medicine with others. What if I miss a dose? It is important not to miss your dose. Call your doctor or health care professional if you are unable to keep an appointment. What may  interact with this medicine? -female hormones like estrogen -herbal or dietary supplements like black cohosh, chasteberry, or DHEA -female hormones like testosterone -prasterone This list may not describe all possible interactions. Give your health care provider a list of all the medicines, herbs, non-prescription drugs, or dietary supplements you use. Also tell them if you smoke, drink alcohol, or use illegal drugs. Some items may interact with your medicine. What should I watch for while using this medicine? Visit your doctor or health care professional for regular checks on your progress. Your symptoms may appear to get worse during the first weeks of this therapy. Tell your doctor or healthcare professional if your symptoms do not start to get better or if they get worse after this time. Your bones may get weaker if you take this medicine for a long time. If you smoke or frequently drink alcohol you may increase your risk of bone loss. A family history of osteoporosis, chronic use of drugs for seizures (convulsions), or corticosteroids can also increase your risk of bone loss. Talk to your doctor about how to keep your bones strong. This medicine should stop regular monthly menstration in women. Tell your doctor if you continue to menstrate. Women should not become pregnant while taking this medicine or for 12 weeks after stopping this medicine. Women should inform their doctor if they wish to become pregnant or think they might be pregnant. There is a potential for serious side effects to an unborn child. Talk to your health care professional or pharmacist for more information. Do not breast-feed an infant while taking this medicine. Men should   inform their doctors if they wish to father a child. This medicine may lower sperm counts. Talk to your health care professional or pharmacist for more information. What side effects may I notice from receiving this medicine? Side effects that you should  report to your doctor or health care professional as soon as possible: -allergic reactions like skin rash, itching or hives, swelling of the face, lips, or tongue -bone pain -breathing problems -changes in vision -chest pain -feeling faint or lightheaded, falls -fever, chills -pain, swelling, warmth in the leg -pain, tingling, numbness in the hands or feet -signs and symptoms of low blood pressure like dizziness; feeling faint or lightheaded, falls; unusually weak or tired -stomach pain -swelling of the ankles, feet, hands -trouble passing urine or change in the amount of urine -unusually high or low blood pressure -unusually weak or tired Side effects that usually do not require medical attention (report to your doctor or health care professional if they continue or are bothersome): -change in sex drive or performance -changes in breast size in both males and females -changes in emotions or moods -headache -hot flashes -irritation at site where injected -loss of appetite -skin problems like acne, dry skin -vaginal dryness This list may not describe all possible side effects. Call your doctor for medical advice about side effects. You may report side effects to FDA at 1-800-FDA-1088. Where should I keep my medicine? This drug is given in a hospital or clinic and will not be stored at home. NOTE: This sheet is a summary. It may not cover all possible information. If you have questions about this medicine, talk to your doctor, pharmacist, or health care provider.    2016, Elsevier/Gold Standard. (2013-07-31 11:10:35)  

## 2015-12-30 NOTE — Telephone Encounter (Signed)
Faxed to [facility]

## 2016-01-01 ENCOUNTER — Encounter: Payer: Self-pay | Admitting: Registered Nurse

## 2016-01-01 ENCOUNTER — Encounter: Payer: Medicare Other | Admitting: Registered Nurse

## 2016-01-01 VITALS — BP 120/73 | HR 86

## 2016-01-01 DIAGNOSIS — G243 Spasmodic torticollis: Secondary | ICD-10-CM | POA: Diagnosis not present

## 2016-01-01 DIAGNOSIS — G894 Chronic pain syndrome: Secondary | ICD-10-CM

## 2016-01-01 DIAGNOSIS — I890 Lymphedema, not elsewhere classified: Secondary | ICD-10-CM | POA: Diagnosis not present

## 2016-01-05 NOTE — Progress Notes (Signed)
Patient ID: Karen Hopkins, female   DOB: March 06, 1976, 40 y.o.   MRN: [id number]   Chief Complaint: Spasmodic Torticollis, Chronic Pain Syndrome: G24.3 G89.4 Medical and Surgical History: [name] is a 40 year old female with history of right breast carcinoma, Status post modified radical mastectomy with postoperative radiation therapy In 2014. She had subsequent development of chronic right shoulder and right neck pain. She underwent reconstruction with tissue expanders as well as a breast reduction surgery in 2015. She also received the following treatments trigger point injections, single needle technique with acupuncture needle, dry needilng with physical therapy. [name]  Received short term relief with TENS Unit..   Procedure Performed: Percutaneous Electrical Nerve Stimulation ( PENS) #1.   Pain Score: 7  Informed Consent:Discussedalternative treatment options, possible risks, potential complications,andtreatment limitations.  Position: The patient was seated with torso at 90 degrees to the legs.  Description: The patient was taken to the exam room where the diagnostic implantation neurostimulation device through placement of needle array electrodes were percutaneously placed after identifying the point of maximal tenderness (Right Trapezius). The area was cleaned with an alcohol swab and then allowed to air dry. The needle array was removed from the packing and placed over the marked areas. Both thumbs were used to apply perpendicular pressure at greater than 10 pounds of pressure per square inch over the needle array penetrating each through the dermal layer into the subcutaneous layer. The patient was positioned seated upright and instructed on the use of the Biowave device so that the intensity could be controlled through the session. The device was activated to deliver the electrical signals through the skin into the deep tissue at the pain site. The treatment  duration lasted 30 minutes,during which time someone stayed with the pt for the duration of treatment. The pt started at 26 % intensity and reached 47.5 % intensity. At the completion of the treatment the electrodes were removed and placed in the sharps disposal container. The area was cleaned with an alcohol swab. No bleeding noted. The patient tolerated the procedure well with no complications. Pre-treatment pain level was 7/10. The patient reports the pain level post treatment was 5/10. Follow up appointment scheduled for PENS treatment is 01/06/2016.     Medication List       Accurate as of 01/01/16 11:59 PM. Always use your most recent med list.          ACCU-CHEK SMARTVIEW test strip Generic drug:  glucose blood USE TO CHECK BLOOD SUGAR LEVELS FOUR TIMES DAILY AS DIRECTED   ALPRAZolam 1 MG 24 hr tablet Commonly known as:  ALPRAZOLAM XR Take 1 tablet (1 mg total) by mouth daily.   anastrozole 1 MG tablet Commonly known as:  ARIMIDEX Take 1 tablet (1 mg total) by mouth daily.   blood glucose meter kit and supplies Kit Dispense based on patient and insurance preference. Use up to four times daily as directed. (FOR ICD-9 250.00, 250.01).   carvedilol 6.25 MG tablet Commonly known as:  COREG Take 1 tablet (6.25 mg total) by mouth 2 (two) times daily with a meal.   citalopram 20 MG tablet Commonly known as:  CELEXA TAKE ONE (1) TABLET BY MOUTH EVERY DAY   fluticasone 50 MCG/ACT nasal spray Commonly known as:  FLONASE Place 2 sprays into both nostrils daily.   furosemide 40 MG tablet Commonly known as:  LASIX Take 1 tablet (40 mg total) by mouth daily.   goserelin 3.6 MG injection Commonly known  as:  ZOLADEX Inject 3.6 mg into the skin every 28 (twenty-eight) days.   losartan 50 MG tablet Commonly known as:  COZAAR Take 1 tablet (50 mg total) by mouth daily.   metFORMIN 500 MG tablet Commonly known as:  GLUCOPHAGE Take 1 tablet (500 mg total) by mouth 2 (two) times  daily with a meal.   metoCLOPramide 10 MG tablet Commonly known as:  REGLAN Take 1 tablet (10 mg total) by mouth every 8 (eight) hours as needed.   naproxen 500 MG tablet Commonly known as:  NAPROSYN Take 1 tablet (500 mg total) by mouth 2 (two) times daily with a meal.   oxyCODONE 5 MG immediate release tablet Commonly known as:  Oxy IR/ROXICODONE Take 1 tablet (5 mg total) by mouth every 6 (six) hours as needed for severe pain.   polyethylene glycol powder powder Commonly known as:  GLYCOLAX Take 17 g by mouth daily.   potassium chloride SA 20 MEQ tablet Commonly known as:  K-DUR,KLOR-CON Take 20 mEq by mouth daily.   tiZANidine 4 MG tablet Commonly known as:  ZANAFLEX TAKE ONE (1) TABLET BY MOUTH 3 TIMES DAILY   topiramate 50 MG tablet Commonly known as:  TOPAMAX Take 2 tablets (100 mg total) by mouth at bedtime.      Post Vitals: BP 120/73 (BP Location: Left Wrist, Cuff Size: Normal)   Pulse 86   SpO2 97%  . Pain Score 5

## 2016-01-06 ENCOUNTER — Encounter: Payer: Medicare Other | Admitting: Registered Nurse

## 2016-01-08 ENCOUNTER — Encounter: Payer: Medicare Other | Attending: Registered Nurse | Admitting: Registered Nurse

## 2016-01-08 ENCOUNTER — Encounter: Payer: Self-pay | Admitting: Registered Nurse

## 2016-01-08 VITALS — BP 124/82 | HR 83

## 2016-01-08 DIAGNOSIS — G243 Spasmodic torticollis: Secondary | ICD-10-CM | POA: Diagnosis not present

## 2016-01-08 DIAGNOSIS — G894 Chronic pain syndrome: Secondary | ICD-10-CM

## 2016-01-08 DIAGNOSIS — I890 Lymphedema, not elsewhere classified: Secondary | ICD-10-CM | POA: Insufficient documentation

## 2016-01-08 DIAGNOSIS — M546 Pain in thoracic spine: Secondary | ICD-10-CM | POA: Insufficient documentation

## 2016-01-08 NOTE — Progress Notes (Signed)
Patient ID: Karen Hopkins, female   DOB: November 20, 1975, 40 y.o.   MRN: [id number]   Chief Complaint: Spasmodic Torticollis, Chronic Pain Syndrome: G24.3 G89.4 Medical and Surgical History: [name] is a 40 year old female with history of right breast carcinoma, Status post modified radical mastectomy with postoperative radiation therapy In 2014. She had subsequent development of chronic right shoulder and right neck pain. She underwent reconstruction with tissue expanders as well as a breast reduction surgery in 2015. She also received the following treatments trigger point injections, single needle technique with acupuncture needle, dry needilng with physical therapy. [name]  Received short term relief with TENS Unit..   Procedure Performed: Percutaneous Electrical Nerve Stimulation ( PENS) # 2.  Pain Score: 6  Informed Consent:Discussedalternative treatment options, possible risks, potential complications,andtreatment limitations.  Position: The patient was seated with torso at 90 degrees to the legs.  Description: The patient was taken to the exam room where the diagnostic implantation neurostimulation device through placement of needle array electrodes were percutaneously placed after identifying the point of maximal tenderness (Right Trapezius). The area was cleaned with an alcohol swab and then allowed to air dry. The needle array was removed from the packing and placed over the marked areas. Both thumbs were used to apply perpendicular pressure at greater than 10 pounds of pressure per square inch over the needle array penetrating each through the dermal layer into the subcutaneous layer. The patient was positioned seated upright and instructed on the use of the Biowave device so that the intensity could be controlled through the session. The device was activated to deliver the electrical signals through the skin into the deep tissue at the pain site. The treatment  duration lasted 30 minutes,during which time someone stayed with the pt for the duration of treatment. The pt started at 27 % intensity and reached 50 % intensity. At the completion of the treatment the electrodes were removed and placed in the sharps disposal container. The area was cleaned with an alcohol swab. Nobleedingnoted. The patient tolerated the procedure well with no complications. Pre-treatment pain level was 7/10. The patient reports the pain level post treatment was 4/10. Follow up appointment scheduled for PENS treatment is 01/09/2016.     Medication List       Accurate as of 01/08/16 10:32 AM. Always use your most recent med list.          ACCU-CHEK SMARTVIEW test strip Generic drug:  glucose blood USE TO CHECK BLOOD SUGAR LEVELS FOUR TIMES DAILY AS DIRECTED   ALPRAZolam 1 MG 24 hr tablet Commonly known as:  ALPRAZOLAM XR Take 1 tablet (1 mg total) by mouth daily.   anastrozole 1 MG tablet Commonly known as:  ARIMIDEX Take 1 tablet (1 mg total) by mouth daily.   blood glucose meter kit and supplies Kit Dispense based on patient and insurance preference. Use up to four times daily as directed. (FOR ICD-9 250.00, 250.01).   carvedilol 6.25 MG tablet Commonly known as:  COREG Take 1 tablet (6.25 mg total) by mouth 2 (two) times daily with a meal.   citalopram 20 MG tablet Commonly known as:  CELEXA TAKE ONE (1) TABLET BY MOUTH EVERY DAY   fluticasone 50 MCG/ACT nasal spray Commonly known as:  FLONASE Place 2 sprays into both nostrils daily.   furosemide 40 MG tablet Commonly known as:  LASIX Take 1 tablet (40 mg total) by mouth daily.   goserelin 3.6 MG injection Commonly known as:  ZOLADEX Inject 3.6 mg into the skin every 28 (twenty-eight) days.   losartan 50 MG tablet Commonly known as:  COZAAR Take 1 tablet (50 mg total) by mouth daily.   metFORMIN 500 MG tablet Commonly known as:  GLUCOPHAGE Take 1 tablet (500 mg total) by mouth 2 (two) times  daily with a meal.   metoCLOPramide 10 MG tablet Commonly known as:  REGLAN Take 1 tablet (10 mg total) by mouth every 8 (eight) hours as needed.   naproxen 500 MG tablet Commonly known as:  NAPROSYN Take 1 tablet (500 mg total) by mouth 2 (two) times daily with a meal.   oxyCODONE 5 MG immediate release tablet Commonly known as:  Oxy IR/ROXICODONE Take 1 tablet (5 mg total) by mouth every 6 (six) hours as needed for severe pain.   polyethylene glycol powder powder Commonly known as:  GLYCOLAX Take 17 g by mouth daily.   potassium chloride SA 20 MEQ tablet Commonly known as:  K-DUR,KLOR-CON Take 20 mEq by mouth daily.   tiZANidine 4 MG tablet Commonly known as:  ZANAFLEX TAKE ONE (1) TABLET BY MOUTH 3 TIMES DAILY   topiramate 50 MG tablet Commonly known as:  TOPAMAX Take 2 tablets (100 mg total) by mouth at bedtime.       Post Vitals: BP 124/82 P: 77 Sat's: 98%

## 2016-01-09 ENCOUNTER — Encounter: Payer: Self-pay | Admitting: Physical Medicine & Rehabilitation

## 2016-01-09 ENCOUNTER — Encounter (HOSPITAL_BASED_OUTPATIENT_CLINIC_OR_DEPARTMENT_OTHER): Payer: Medicare Other | Admitting: Physical Medicine & Rehabilitation

## 2016-01-09 VITALS — BP 123/76 | HR 89

## 2016-01-09 DIAGNOSIS — G894 Chronic pain syndrome: Secondary | ICD-10-CM | POA: Diagnosis not present

## 2016-01-09 DIAGNOSIS — G243 Spasmodic torticollis: Secondary | ICD-10-CM | POA: Diagnosis not present

## 2016-01-09 NOTE — Progress Notes (Signed)
Patient ID: Karen Hopkins, female   DOB: 1976-03-31, 40 y.o.   MRN: [id number]   Chief Complaint: Spasmodic Torticollis, Chronic Pain Syndrome: G24.3 G89.4 Medical and Surgical History: Ms. Keviona Foulkes is a 40 year old female with history of right breast carcinoma, Status post modified radical mastectomy with postoperative radiation therapy In 2014. She had subsequent development of chronic right shoulder and right neck pain. She underwent reconstruction with tissue expanders as well as a breast reduction surgery in 2015. She also received the following treatments trigger point injections, single needle technique with acupuncture needle, dry needilng with physical therapy. [name]  Received short term relief with TENS Unit..   Procedure Performed: Percutaneous Electrical Nerve Stimulation ( PENS) # 3.  Pain Score: 7  Informed Consent:Discussedalternative treatment options, possible risks, potential complications,andtreatment limitations.  Position: The patient was seated with torso at 90 degrees to the legs.  Description: The patient was taken to the exam room where the diagnostic implantation neurostimulation device through placement of needle array electrodes were percutaneously placed after identifying the point of maximal tenderness (Right Trapezius and scapula). The area was cleaned with an alcohol swab and then allowed to air dry. The needle array was removed from the packing and placed over the marked areas. Both thumbs were used to apply perpendicular pressure at greater than 10 pounds of pressure per square inch over the needle array penetrating each through the dermal layer into the subcutaneous layer. The patient was positioned seated upright and instructed on the use of the Biowave device so that the intensity could be controlled through the session. The device was activated to deliver the electrical signals through the skin into the deep tissue at the pain site. The  treatment duration lasted 30 minutes,during which time someone stayed with the pt for the duration of treatment. The pt started at 29 % intensity and reached 44 % intensity. At the completion of the treatment the electrodes were removed and placed in the sharps disposal container. The area was cleaned with an alcohol swab. Nobleedingnoted. The patient tolerated the procedure well with no complications. Pre-treatment pain level was 7/10. The patient reports the pain level post treatment was 5/10. Follow up appointment scheduled for PENS treatment is 01/13/2016.   Post Vitals: BP 141/88 P: 88 Sat's: 98%

## 2016-01-13 ENCOUNTER — Encounter (HOSPITAL_BASED_OUTPATIENT_CLINIC_OR_DEPARTMENT_OTHER): Payer: Medicare Other | Admitting: Registered Nurse

## 2016-01-13 ENCOUNTER — Encounter: Payer: Self-pay | Admitting: Registered Nurse

## 2016-01-13 VITALS — BP 128/88 | HR 77

## 2016-01-13 DIAGNOSIS — G243 Spasmodic torticollis: Secondary | ICD-10-CM | POA: Diagnosis not present

## 2016-01-13 DIAGNOSIS — G894 Chronic pain syndrome: Secondary | ICD-10-CM | POA: Diagnosis not present

## 2016-01-13 NOTE — Progress Notes (Signed)
Hopkins ID: Karen Hopkins, female   DOB: [birth year], 40 y.o.   MRN: [id number]   Chief Complaint: Spasmodic Torticollis, Chronic Pain Syndrome: G24.3 G89.4 Medical and Surgical History: Karen Hopkins is a 40 year old female with history of right breast carcinoma, Status post modified radical mastectomy with postoperative radiation therapy In 2014. She had subsequent development of chronic right shoulder and right neck pain. She underwent reconstruction with tissue expanders as well as a breast reduction surgery in 2015. She alsoreceived Karen following treatments trigger point injections, single needle technique with acupuncture needle, dry needilng with physical therapy. Karen Hopkins Received short termrelief with TENS Unit..   Procedure Performed: Percutaneous Electrical Nerve Stimulation ( PENS) # 4.  Pain Score: 7  Informed Consent:Discussedalternative treatment options, possible risks, potential complications,andtreatment limitations.  Position: Karen Hopkins was seated with torso at 90 degrees to Karen legs.  Description: Karen Hopkins was taken to Karen exam room where Karen diagnostic implantation neurostimulation device through placement of needle array electrodes were percutaneously placed after identifying Karen point of maximal tenderness (Right Trapezius). Karen area was cleaned with an alcohol swab and then allowed to air dry. Karen needle array was removed from Karen packing and placed over Karen marked areas. Both thumbs were used to apply perpendicular pressure at greater than 10 pounds of pressure per square inch over Karen needle array penetrating each through Karen dermal layer into Karen subcutaneous layer. Karen Hopkins was positioned seated upright and instructed on Karen use of Karen Biowave device so that Karen intensity could be controlled through Karen session. Karen device was activated to deliver Karen electrical signals through Karen skin into Karen deep tissue at Karen pain site. Karen treatment duration  lasted 30 minutes,during which time someone stayed with Karen Karen Hopkins for Karen duration of treatment. Karen Karen Hopkins started at 42 % intensity and reached 70 % intensity. At Karen completion of Karen treatment Karen electrodes were removed and placed in Karen sharps disposal container. Karen area was cleaned with an alcohol swab. Nobleedingnoted. Karen Hopkins tolerated Karen procedure well with no complications. Pre-treatment pain level was 7/10. Karen Hopkins reports Karen pain level post treatment was 4 /10. Follow up appointment scheduled for PENS treatment is 01/15/2016.     Medication List       Accurate as of 01/13/16 10:35 AM. Always use your most recent med list.          ACCU-CHEK SMARTVIEW test strip Generic drug:  glucose blood USE TO CHECK BLOOD SUGAR LEVELS FOUR TIMES DAILY AS DIRECTED   ALPRAZolam 1 MG 24 hr tablet Commonly known as:  ALPRAZOLAM XR Take 1 tablet (1 mg total) by mouth daily.   anastrozole 1 MG tablet Commonly known as:  ARIMIDEX Take 1 tablet (1 mg total) by mouth daily.   blood glucose meter kit and supplies Kit Dispense based on Hopkins and insurance preference. Use up to four times daily as directed. (FOR ICD-9 250.00, 250.01).   carvedilol 6.25 MG tablet Commonly known as:  COREG Take 1 tablet (6.25 mg total) by mouth 2 (two) times daily with a meal.   citalopram 20 MG tablet Commonly known as:  CELEXA TAKE ONE (1) TABLET BY MOUTH EVERY DAY   fluticasone 50 MCG/ACT nasal spray Commonly known as:  FLONASE Place 2 sprays into both nostrils daily.   furosemide 40 MG tablet Commonly known as:  LASIX Take 1 tablet (40 mg total) by mouth daily.   goserelin 3.6 MG injection Commonly known as:  ZOLADEX Inject 3.6  mg into Karen skin every 28 (twenty-eight) days.   losartan 50 MG tablet Commonly known as:  COZAAR Take 1 tablet (50 mg total) by mouth daily.   metFORMIN 500 MG tablet Commonly known as:  GLUCOPHAGE Take 1 tablet (500 mg total) by mouth 2 (two) times daily  with a meal.   metoCLOPramide 10 MG tablet Commonly known as:  REGLAN Take 1 tablet (10 mg total) by mouth every 8 (eight) hours as needed.   naproxen 500 MG tablet Commonly known as:  NAPROSYN Take 1 tablet (500 mg total) by mouth 2 (two) times daily with a meal.   oxyCODONE 5 MG immediate release tablet Commonly known as:  Oxy IR/ROXICODONE Take 1 tablet (5 mg total) by mouth every 6 (six) hours as needed for severe pain.   polyethylene glycol powder powder Commonly known as:  GLYCOLAX Take 17 g by mouth daily.   potassium chloride SA 20 MEQ tablet Commonly known as:  K-DUR,KLOR-CON Take 20 mEq by mouth daily.   tiZANidine 4 MG tablet Commonly known as:  ZANAFLEX TAKE ONE (1) TABLET BY MOUTH 3 TIMES DAILY   topiramate 50 MG tablet Commonly known as:  TOPAMAX Take 2 tablets (100 mg total) by mouth at bedtime.      Post Vitals: 128/88 P: 77 Sat's: 97%

## 2016-01-15 ENCOUNTER — Encounter (HOSPITAL_BASED_OUTPATIENT_CLINIC_OR_DEPARTMENT_OTHER): Payer: Medicare Other | Admitting: Registered Nurse

## 2016-01-15 ENCOUNTER — Ambulatory Visit: Payer: Medicare Other

## 2016-01-15 VITALS — BP 131/85 | HR 77 | Resp 16

## 2016-01-15 DIAGNOSIS — G894 Chronic pain syndrome: Secondary | ICD-10-CM | POA: Diagnosis not present

## 2016-01-15 DIAGNOSIS — G243 Spasmodic torticollis: Secondary | ICD-10-CM | POA: Diagnosis not present

## 2016-01-15 NOTE — Progress Notes (Signed)
Patient ID: Karen Hopkins, female   DOB: Mar 18, 1976, 40 y.o.   MRN: [id number]   Chief Complaint: Spasmodic Torticollis, Chronic Pain Syndrome: G24.3 G89.4 Medical and Surgical History: Karen Hopkins is a 40 year old female with history of right breast carcinoma, Status post modified radical mastectomy with postoperative radiation therapy In 2014. She had subsequent development of chronic right shoulder and right neck pain. She underwent reconstruction with tissue expanders as well as a breast reduction surgery in 2015. She alsoreceived the following treatments trigger point injections, single needle technique with acupuncture needle, dry needilng with physical therapy. Karen Hopkins Received short termrelief with TENS Unit..   Procedure Performed: Percutaneous Electrical Nerve Stimulation ( PENS) # 5.  Pre Vitals: 142/94 P: 82 Sat's 97%  Pain Score: 7  Informed Consent:Discussedalternative treatment options, possible risks, potential complications,andtreatment limitations.  Position: The patient was seated with torso at 90 degrees to the legs.  Description: The patient was taken to the exam room where the diagnostic implantation neurostimulation device through placement of needle array electrodes were percutaneously placed after identifying the point of maximal tenderness (Right Trapezius). The area was cleaned with an alcohol swab and then allowed to air dry. The needle array was removed from the packing and placed over the marked areas. Both thumbs were used to apply perpendicular pressure at greater than 10 pounds of pressure per square inch over the needle array penetrating each through the dermal layer into the subcutaneous layer. The patient was positioned seated upright and instructed on the use of the Biowave device so that the intensity could be controlled through the session. The device was activated to deliver the electrical signals through the skin into the deep tissue at  the pain site. The treatment duration lasted 30 minutes,during which time someone stayed with the pt for the duration of treatment. The pt started at 30% intensity and reached 50.5 % intensity. At the completion of the treatment the electrodes were removed and placed in the sharps disposal container. The area was cleaned with an alcohol swab. Nobleedingnoted.The patient tolerated the procedure well with no complications. Pre-treatment pain level was 7/10.The patient reports the pain level post treatment was 4 /10. Follow up appointment scheduled for PENS treatment is 01/20/2016.      Medication List       Accurate as of 01/15/16 10:32 AM. Always use your most recent med list.          ACCU-CHEK SMARTVIEW test strip Generic drug:  glucose blood USE TO CHECK BLOOD SUGAR LEVELS FOUR TIMES DAILY AS DIRECTED   ALPRAZolam 1 MG 24 hr tablet Commonly known as:  ALPRAZOLAM XR Take 1 tablet (1 mg total) by mouth daily.   anastrozole 1 MG tablet Commonly known as:  ARIMIDEX Take 1 tablet (1 mg total) by mouth daily.   blood glucose meter kit and supplies Kit Dispense based on patient and insurance preference. Use up to four times daily as directed. (FOR ICD-9 250.00, 250.01).   carvedilol 6.25 MG tablet Commonly known as:  COREG Take 1 tablet (6.25 mg total) by mouth 2 (two) times daily with a meal.   citalopram 20 MG tablet Commonly known as:  CELEXA TAKE ONE (1) TABLET BY MOUTH EVERY DAY   fluticasone 50 MCG/ACT nasal spray Commonly known as:  FLONASE Place 2 sprays into both nostrils daily.   furosemide 40 MG tablet Commonly known as:  LASIX Take 1 tablet (40 mg total) by mouth daily.   goserelin 3.6 MG injection Commonly  known as:  ZOLADEX Inject 3.6 mg into the skin every 28 (twenty-eight) days.   losartan 50 MG tablet Commonly known as:  COZAAR Take 1 tablet (50 mg total) by mouth daily.   metFORMIN 500 MG tablet Commonly known as:  GLUCOPHAGE Take 1 tablet  (500 mg total) by mouth 2 (two) times daily with a meal.   metoCLOPramide 10 MG tablet Commonly known as:  REGLAN Take 1 tablet (10 mg total) by mouth every 8 (eight) hours as needed.   naproxen 500 MG tablet Commonly known as:  NAPROSYN Take 1 tablet (500 mg total) by mouth 2 (two) times daily with a meal.   oxyCODONE 5 MG immediate release tablet Commonly known as:  Oxy IR/ROXICODONE Take 1 tablet (5 mg total) by mouth every 6 (six) hours as needed for severe pain.   polyethylene glycol powder powder Commonly known as:  GLYCOLAX Take 17 g by mouth daily.   potassium chloride SA 20 MEQ tablet Commonly known as:  K-DUR,KLOR-CON Take 20 mEq by mouth daily.   tiZANidine 4 MG tablet Commonly known as:  ZANAFLEX TAKE ONE (1) TABLET BY MOUTH 3 TIMES DAILY   topiramate 50 MG tablet Commonly known as:  TOPAMAX Take 2 tablets (100 mg total) by mouth at bedtime.      Post Vitals:  BP: 131/85 P: 77 Sats: 97%

## 2016-01-16 ENCOUNTER — Encounter: Payer: Self-pay | Admitting: Physical Medicine & Rehabilitation

## 2016-01-16 ENCOUNTER — Ambulatory Visit (HOSPITAL_BASED_OUTPATIENT_CLINIC_OR_DEPARTMENT_OTHER): Payer: Medicare Other | Admitting: Physical Medicine & Rehabilitation

## 2016-01-16 VITALS — BP 128/85 | HR 88 | Resp 14

## 2016-01-16 DIAGNOSIS — I676 Nonpyogenic thrombosis of intracranial venous system: Secondary | ICD-10-CM

## 2016-01-16 DIAGNOSIS — G243 Spasmodic torticollis: Secondary | ICD-10-CM

## 2016-01-16 DIAGNOSIS — M7918 Myalgia, other site: Secondary | ICD-10-CM

## 2016-01-16 DIAGNOSIS — I890 Lymphedema, not elsewhere classified: Secondary | ICD-10-CM | POA: Diagnosis present

## 2016-01-16 DIAGNOSIS — M546 Pain in thoracic spine: Secondary | ICD-10-CM | POA: Diagnosis present

## 2016-01-16 DIAGNOSIS — M797 Fibromyalgia: Secondary | ICD-10-CM | POA: Diagnosis not present

## 2016-01-16 MED ORDER — OXYCODONE HCL 5 MG PO TABS: 5.0000 mg | ORAL_TABLET | Freq: Four times a day (QID) | ORAL | 0 refills | Status: DC | PRN

## 2016-01-16 NOTE — Patient Instructions (Signed)
Will repeat botox in about 2 months

## 2016-01-16 NOTE — Progress Notes (Signed)
Subjective:    Patient ID: Karen Hopkins, female    DOB: 26-Sep-1975, 40 y.o.   MRN: [id number]  HPI  Botox 12/29/2015 50 units trapezius 75 units levator 50 units sternocleidomastoid 12.5 units platysma  12.5 units scalene  Last botox inj worked better than previous injection  Biowave to Right periscapular muscle x 4 treatments Seems to last about 2 days thus far  Pain Inventory Average Pain 7 Pain Right Now 5 My pain is sharp, burning, dull, stabbing, tingling and aching  In the last 24 hours, has pain interfered with the following? General activity 5 Relation with others 5 Enjoyment of life 5 What TIME of day is your pain at its worst? all Sleep (in general) Poor  Pain is worse with: walking, bending and inactivity Pain improves with: rest, therapy/exercise, medication, TENS and injections Relief from Meds: no selecti0on  Mobility walk without assistance how many minutes can you walk? 10 ability to climb steps?  yes do you drive?  yes  Function disabled: date disabled .  Neuro/Psych numbness tingling spasms  Prior Studies Any changes since last visit?  no  Physicians involved in your care Any changes since last visit?  no   Family History  Problem Relation Age of Onset  . Hypertension Mother   . Diabetes type II Father   . Prostate cancer Father   . Stroke Neg Hx    Social History   Social History  . Marital status: Single    Spouse name: N/A  . Number of children: 3  . Years of education: 13   Occupational History  . Disability     Social History Main Topics  . Smoking status: Former Smoker    Packs/day: 0.25    Years: 5.00    Quit date: 04/26/2010  . Smokeless tobacco: Never Used  . Alcohol use No  . Drug use: No  . Sexual activity: Not Currently    Birth control/ protection: Surgical   Other Topics Concern  . None   Social History Narrative   Lives with kids   Caffeine use: none    Past Surgical History:  Procedure  Laterality Date  . BREAST REDUCTION SURGERY Left 08/23/2013   Procedure: LEFT BREAST REDUCTION  ;  Surgeon: Theodoro Kos, DO;  Location: [facility];  Service: Plastics;  Laterality: Left;  . CESAREAN SECTION  07/15/2001; 03/18/2004; 11/21/2008  . LATISSIMUS FLAP TO BREAST Right 03/12/2013   Procedure: RIGHT BREAST LATISSIMUS FLAP WITH EXPANDER PLACEMENT;  Surgeon: Theodoro Kos, DO;  Location: [facility];  Service: Plastics;  Laterality: Right;  . LIPOSUCTION Bilateral 08/23/2013   Procedure: LIPOSUCTION;  Surgeon: Theodoro Kos, DO;  Location: [facility] [facility];  Service: Plastics;  Laterality: Bilateral;  . MODIFIED RADICAL MASTECTOMY Right 03/11/2010  . NASAL SEPTUM SURGERY    . PORT-A-CATH REMOVAL Left 12/01/2010  . PORTACATH PLACEMENT Left 09/12/2009  . REMOVAL OF TISSUE EXPANDER AND PLACEMENT OF IMPLANT Right 08/23/2013   Procedure: REMOVAL RIGHT TISSUE EXPANDER AND PLACEMENT OF IMPLANT TO RIGHT BREAST ;  Surgeon: Theodoro Kos, DO;  Location: [facility];  Service: Plastics;  Laterality: Right;  . SUPRA-UMBILICAL HERNIA  [id number]  . TUBAL LIGATION  11/21/2008   Past Medical History:  Diagnosis Date  . Anxiety   . Cancer ([place])   . Depression   . Essential hypertension 11/05/2015  . History of breast cancer 2011   right  . History of chemotherapy 2011  . History of radiation  therapy 2011  . Hypertension    under control with med., has been on med. x 2 yr.  . Lymphedema of arm    right; no BP or puncture to right arm  . Seasonal allergies   . Sinus headache    BP 128/85 (BP Location: Left Wrist, Patient Position: Sitting, Cuff Size: Normal)   Pulse 88   Resp 14   SpO2 97%   Opioid Risk Score:   Fall Risk Score:  `1  Depression screen PHQ 2/9  Depression screen [facility] 2/9 08/04/2015 06/25/2015 03/04/2015 01/20/2015 01/07/2015 11/21/2014 10/07/2014  Decreased Interest 0 0 0 0 0 0 0  Down, Depressed, Hopeless 0 0 0 0 0 0 0  PHQ - 2 Score 0 0 0 0 0 0 0  Altered  sleeping - - - - - - -  Tired, decreased energy - - - - - - -  Change in appetite - - - - - - -  Feeling bad or failure about yourself  - - - - - - -  Trouble concentrating - - - - - - -  Moving slowly or fidgety/restless - - - - - - -  Suicidal thoughts - - - - - - -  PHQ-9 Score - - - - - - -  Some recent data might be hidden    Review of Systems  Constitutional: Positive for unexpected weight change.  HENT: Negative.   Eyes: Negative.   Respiratory: Negative.   Cardiovascular: Negative.   Gastrointestinal: Positive for constipation.  Genitourinary: Negative.   Musculoskeletal: Positive for arthralgias and myalgias.       Spasms   Allergic/Immunologic: Negative.   Neurological: Positive for numbness.       Tingling   Hematological: Negative.   Psychiatric/Behavioral: Negative.   All other systems reviewed and are negative.      Objective:   Physical Exam  Constitutional: She is oriented to [place], place, and time. She appears well-developed and well-nourished.  HENT:  Head: Normocephalic and atraumatic.  Eyes: Conjunctivae are normal. Pupils are equal, round, and reactive to light.  Neurological: She is alert and oriented to [place], place, and time.  Psychiatric: She has a normal mood and affect.  Nursing note and vitals reviewed.   Right trap, levator, scalenes, cervical paraspinal non tender  Cervical range of motion flexion 100%, extension 75%, left lateral bending 50%, right lateral bending 75%. Rotation 100% to the right, 75% left     Assessment & Plan:  1. Cervical dystonia, improved after botulinum toxin injection with doses listed above. Will continue the current doses listed for the next treatment, which will be in about 2 months  2.  Cervical myofascial pain improving with biowave wave. Pain reduced from 7/10 pre PENs to 4-5/10 post PENs

## 2016-01-20 ENCOUNTER — Encounter: Payer: Self-pay | Admitting: Registered Nurse

## 2016-01-20 ENCOUNTER — Encounter (HOSPITAL_BASED_OUTPATIENT_CLINIC_OR_DEPARTMENT_OTHER): Payer: Medicare Other | Admitting: Registered Nurse

## 2016-01-20 VITALS — BP 123/79 | HR 78 | Resp 14

## 2016-01-20 DIAGNOSIS — G894 Chronic pain syndrome: Secondary | ICD-10-CM

## 2016-01-20 DIAGNOSIS — G243 Spasmodic torticollis: Secondary | ICD-10-CM | POA: Diagnosis not present

## 2016-01-20 NOTE — Progress Notes (Signed)
Patient ID: Karen Hopkins, female   DOB: 04-Jun-1976, 40 y.o.   MRN: [id number]   Chief Complaint: Spasmodic Torticollis, Chronic Pain Syndrome: G24.3 G89.4 Medical and Surgical History: [name] is a 40 year old female with history of Karen breast carcinoma, Status post modified radical mastectomy with postoperative radiation therapy In 2014. She had subsequent development of chronic Karen shoulder and Karen neck pain. She underwent reconstruction with tissue expanders as well as a breast reduction surgery in 2015. She alsoreceived the following treatments trigger point injections, single needle technique with acupuncture needle, dry needilng with physical therapy. Karen Hopkins Received short termrelief with TENS Unit..   Procedure Performed: Percutaneous Electrical Nerve Stimulation ( PENS) # 6.  Pre Vitals: 128/91 P: 81 Sat's 97%  Pain Score: 8  Informed Consent:Discussedalternative treatment options, possible risks, potential complications,andtreatment limitations.  Position: The patient was seated with torso at 90 degrees to the legs.  Description: The patient was taken to the exam room where the diagnostic implantation neurostimulation device through placement of needle array electrodes were percutaneously placed after identifying the point of maximal tenderness (Karen Hopkins). The area was cleaned with an alcohol swab and then allowed to air dry. The needle array was removed from the packing and placed over the marked areas. Both thumbs were used to apply perpendicular pressure at greater than 10 pounds of pressure per square inch over the needle array penetrating each through the dermal layer into the subcutaneous layer. The patient was positioned seated upright and instructed on the use of the Biowave device so that the intensity could be controlled through the session. The device was activated to deliver the electrical signals through the skin into the deep tissue  at the pain site. The treatment duration lasted 30 minutes,during which time someone stayed with the pt for the duration of treatment. The pt started at 35% intensity and reached  64% intensity. At the completion of the treatment the electrodes were removed and placed in the sharps disposal container. The area was cleaned with an alcohol swab. Nobleedingnoted.The patient tolerated the procedure well with no complications. Pre-treatment pain level was 8/10.The patient reports the pain level post treatment was 5/10. Follow up appointment scheduled for PENS treatment is 01/22/2016.     Medication List       Accurate as of 01/20/16 10:41 AM. Always use your most recent med list.          ACCU-CHEK SMARTVIEW test strip Generic drug:  glucose blood USE TO CHECK BLOOD SUGAR LEVELS FOUR TIMES DAILY AS DIRECTED   ALPRAZolam 1 MG 24 hr tablet Commonly known as:  ALPRAZOLAM XR Take 1 tablet (1 mg total) by mouth daily.   anastrozole 1 MG tablet Commonly known as:  ARIMIDEX Take 1 tablet (1 mg total) by mouth daily.   blood glucose meter kit and supplies Kit Dispense based on patient and insurance preference. Use up to four times daily as directed. (FOR ICD-9 250.00, 250.01).   carvedilol 6.25 MG tablet Commonly known as:  COREG Take 1 tablet (6.25 mg total) by mouth 2 (two) times daily with a meal.   citalopram 20 MG tablet Commonly known as:  CELEXA TAKE ONE (1) TABLET BY MOUTH EVERY DAY   fluticasone 50 MCG/ACT nasal spray Commonly known as:  FLONASE Place 2 sprays into both nostrils daily.   furosemide 40 MG tablet Commonly known as:  LASIX Take 1 tablet (40 mg total) by mouth daily.   goserelin 3.6 MG injection Commonly known as:  ZOLADEX Inject 3.6 mg into the skin every 28 (twenty-eight) days.   losartan 50 MG tablet Commonly known as:  COZAAR Take 1 tablet (50 mg total) by mouth daily.   metFORMIN 500 MG tablet Commonly known as:  GLUCOPHAGE Take 1 tablet (500  mg total) by mouth 2 (two) times daily with a meal.   metoCLOPramide 10 MG tablet Commonly known as:  REGLAN Take 1 tablet (10 mg total) by mouth every 8 (eight) hours as needed.   naproxen 500 MG tablet Commonly known as:  NAPROSYN Take 1 tablet (500 mg total) by mouth 2 (two) times daily with a meal.   oxyCODONE 5 MG immediate release tablet Commonly known as:  Oxy IR/ROXICODONE Take 1 tablet (5 mg total) by mouth every 6 (six) hours as needed for severe pain.   polyethylene glycol powder powder Commonly known as:  GLYCOLAX Take 17 g by mouth daily.   potassium chloride SA 20 MEQ tablet Commonly known as:  K-DUR,KLOR-CON Take 20 mEq by mouth daily.   tiZANidine 4 MG tablet Commonly known as:  ZANAFLEX TAKE ONE (1) TABLET BY MOUTH 3 TIMES DAILY   topiramate 50 MG tablet Commonly known as:  TOPAMAX Take 2 tablets (100 mg total) by mouth at bedtime.      Post Vitals:  BP: 123/79 P: 78 Sat's: 97%   

## 2016-01-22 ENCOUNTER — Ambulatory Visit: Payer: Medicare Other

## 2016-01-22 ENCOUNTER — Encounter: Payer: Self-pay | Admitting: Registered Nurse

## 2016-01-22 ENCOUNTER — Encounter (HOSPITAL_BASED_OUTPATIENT_CLINIC_OR_DEPARTMENT_OTHER): Payer: Medicare Other | Admitting: Registered Nurse

## 2016-01-22 VITALS — BP 120/78 | HR 79 | Resp 16

## 2016-01-22 DIAGNOSIS — G894 Chronic pain syndrome: Secondary | ICD-10-CM

## 2016-01-22 DIAGNOSIS — G243 Spasmodic torticollis: Secondary | ICD-10-CM | POA: Diagnosis not present

## 2016-01-22 NOTE — Progress Notes (Signed)
Patient ID: Karen Hopkins, female   DOB: 1975/08/25, 40 y.o.   MRN: [id number]   Chief Complaint: Spasmodic Torticollis, Chronic Pain Syndrome: G24.3 G89.4 Medical and Surgical History: [name] is a 40 year old female with history of right breast carcinoma, Status post modified radical mastectomy with postoperative radiation therapy In 2014. She had subsequent development of chronic right shoulder and right neck pain. She underwent reconstruction with tissue expanders as well as a breast reduction surgery in 2015. She alsoreceived the following treatments trigger point injections, single needle technique with acupuncture needle, dry needilng with physical therapy. KarenHopkins Received short termrelief with TENS Unit..   Procedure Performed: Percutaneous Electrical Nerve Stimulation ( PENS) # 7.  Pre Vitals: 149/88 P: 79 Sat's 98%  Pain Score: 7  Informed Consent:Discussedalternative treatment options, possible risks, potential complications,andtreatment limitations.  Position: The patient was seated with torso at 90 degrees to the legs.  Description: The patient was taken to the exam room where the diagnostic implantation neurostimulation device through placement of needle array electrodes were percutaneously placed after identifying the point of maximal tenderness (Right Trapezius). The area was cleaned with an alcohol swab and then allowed to air dry. The needle array was removed from the packing and placed over the marked areas. Both thumbs were used to apply perpendicular pressure at greater than 10 pounds of pressure per square inch over the needle array penetrating each through the dermal layer into the subcutaneous layer. The patient was positioned seated upright and instructed on the use of the Biowave device so that the intensity could be controlled through the session. The device was activated to deliver the electrical signals through the skin into the deep tissue  at the pain site. The treatment duration lasted 30 minutes,during which time someone stayed with the pt for the duration of treatment. The pt started at 29% intensity and reached  55.5% intensity. At the completion of the treatment the electrodes were removed and placed in the sharps disposal container. The area was cleaned with an alcohol swab. Nobleedingnoted.The patient tolerated the procedure well with no complications. Pre-treatment pain level was 7/10.The patient reports the pain level post treatment was 5/10. Follow up appointment scheduled for PENS treatment is 01/29/2016.     Medication List       Accurate as of 01/22/16 10:14 AM. Always use your most recent med list.          ACCU-CHEK SMARTVIEW test strip Generic drug:  glucose blood USE TO CHECK BLOOD SUGAR LEVELS FOUR TIMES DAILY AS DIRECTED   ALPRAZolam 1 MG 24 hr tablet Commonly known as:  ALPRAZOLAM XR Take 1 tablet (1 mg total) by mouth daily.   anastrozole 1 MG tablet Commonly known as:  ARIMIDEX Take 1 tablet (1 mg total) by mouth daily.   blood glucose meter kit and supplies Kit Dispense based on patient and insurance preference. Use up to four times daily as directed. (FOR ICD-9 250.00, 250.01).   carvedilol 6.25 MG tablet Commonly known as:  COREG Take 1 tablet (6.25 mg total) by mouth 2 (two) times daily with a meal.   citalopram 20 MG tablet Commonly known as:  CELEXA TAKE ONE (1) TABLET BY MOUTH EVERY DAY   fluticasone 50 MCG/ACT nasal spray Commonly known as:  FLONASE Place 2 sprays into both nostrils daily.   furosemide 40 MG tablet Commonly known as:  LASIX Take 1 tablet (40 mg total) by mouth daily.   goserelin 3.6 MG injection Commonly known as:  ZOLADEX Inject 3.6 mg into the skin every 28 (twenty-eight) days.   losartan 50 MG tablet Commonly known as:  COZAAR Take 1 tablet (50 mg total) by mouth daily.   metFORMIN 500 MG tablet Commonly known as:  GLUCOPHAGE Take 1 tablet (500  mg total) by mouth 2 (two) times daily with a meal.   metoCLOPramide 10 MG tablet Commonly known as:  REGLAN Take 1 tablet (10 mg total) by mouth every 8 (eight) hours as needed.   naproxen 500 MG tablet Commonly known as:  NAPROSYN Take 1 tablet (500 mg total) by mouth 2 (two) times daily with a meal.   oxyCODONE 5 MG immediate release tablet Commonly known as:  Oxy IR/ROXICODONE Take 1 tablet (5 mg total) by mouth every 6 (six) hours as needed for severe pain.   polyethylene glycol powder powder Commonly known as:  GLYCOLAX Take 17 g by mouth daily.   potassium chloride SA 20 MEQ tablet Commonly known as:  K-DUR,KLOR-CON Take 20 mEq by mouth daily.   tiZANidine 4 MG tablet Commonly known as:  ZANAFLEX TAKE ONE (1) TABLET BY MOUTH 3 TIMES DAILY   topiramate 50 MG tablet Commonly known as:  TOPAMAX Take 2 tablets (100 mg total) by mouth at bedtime.      Post Vitals: BP: 120/78  P: 79 Sat's: 98%

## 2016-01-27 ENCOUNTER — Ambulatory Visit (INDEPENDENT_AMBULATORY_CARE_PROVIDER_SITE_OTHER): Payer: Medicare Other | Admitting: Neurology

## 2016-01-27 ENCOUNTER — Encounter: Payer: Self-pay | Admitting: Neurology

## 2016-01-27 ENCOUNTER — Ambulatory Visit (HOSPITAL_BASED_OUTPATIENT_CLINIC_OR_DEPARTMENT_OTHER): Payer: Medicare Other

## 2016-01-27 VITALS — BP 134/88 | HR 79 | Ht 64.0 in | Wt 254.8 lb

## 2016-01-27 VITALS — BP 120/84 | HR 88 | Temp 98.4°F | Resp 16

## 2016-01-27 DIAGNOSIS — Z5111 Encounter for antineoplastic chemotherapy: Secondary | ICD-10-CM | POA: Diagnosis present

## 2016-01-27 DIAGNOSIS — G43009 Migraine without aura, not intractable, without status migrainosus: Secondary | ICD-10-CM

## 2016-01-27 DIAGNOSIS — C50911 Malignant neoplasm of unspecified site of right female breast: Secondary | ICD-10-CM

## 2016-01-27 DIAGNOSIS — C50411 Malignant neoplasm of upper-outer quadrant of right female breast: Secondary | ICD-10-CM

## 2016-01-27 DIAGNOSIS — I676 Nonpyogenic thrombosis of intracranial venous system: Secondary | ICD-10-CM | POA: Diagnosis not present

## 2016-01-27 MED ORDER — GOSERELIN ACETATE 3.6 MG ~~LOC~~ IMPL
3.6000 mg | DRUG_IMPLANT | Freq: Once | SUBCUTANEOUS | Status: AC
  Administered 2016-01-27: 3.6 mg via SUBCUTANEOUS
  Filled 2016-01-27: qty 3.6

## 2016-01-27 MED ORDER — TOPIRAMATE 50 MG PO TABS: 150.0000 mg | ORAL_TABLET | Freq: Every day | ORAL | 12 refills | Status: DC

## 2016-01-27 NOTE — Progress Notes (Signed)
[id number]    Provider:  Dr Jaynee Eagles Referring Provider: [name], MD Primary Care Physician:  Chrisandra Netters, MD    Interval history: 40 y.o. female here as a referral from Dr. Ardelia Mems for intractable headaches. PMHx of right breast carcinoma, Status post modified radical mastectomy with postoperative radiation therapy In 2014, HTN, diabetes, migraine, depression, anxiety. She has chronic migraines. MRI of the brain showed a focus of FLAIR hyperintensity, right frontal subcortical white matter possibly a nonspecific focus of ischemic demyelination, or possible post treatment effect. Also seen was an Empty sella.She is on ASA 81mg  for stroke prevention. She went for surgery and saw neuro-ophthalmology at [facility] for enlarged lacrimal glands, biopsy was performed and biopsy was unrevealing and steroid injection swere placed into both eyes. She saw cardiology for evaluation after echocardiogram. She is being managed by cardiology for her HTN. Her migraines are improved she still have some headaches around the eyes. No vision changes, no blurry vision, no hearing changes, no side effects to the Topiramate. She has 3 boys no more children.   Extensive lab workup including the following was unremarkable: hiv, rpr, sickle cell, c3/c4 and complement(slughtly elevated), pan-anca, TSH, hemoglobinopathy,  cardiolipin antibodies, prothrombin gene, fV leiden, homocysteine, b2-glycoprotein, lupus, protein c, antithrombin c, ldl (55), ace  Protein s activity and total were slightly elevated. Sed rate slightly elevated. Hgba1c 6.4.   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   Ultrasound carotids normal.   Mri brain also with partially empty sella could be intracranial hypertension  Cardiac event monitor normal.   Repeat mri orbits 11/2015:   FINDINGS:  Periorbital soft  tissues: Prominent bilateral lacrimal glands which measure upper limits of normal versus mildly enlarged. No focal mass lesion is seen within either lacrimal gland. Globes: Slightly misshapen and elongated appearance of the globes bilaterally. Optic nerves: Normal size, contour, and signal bilaterally. Extraocular muscles: Symmetrical, normal size and signal. Contrast: No abnormal enhancement to suggest mass or infection.  Addendum: Echo cardiogram read: "- Global LV longitudinal strain -16.2% is mildly reduced and lower than on the previous study (-20%)". Discussed with Dr. Orene Desanctis, will refer to Dr. Haroldine Laws in Cardiology thanks.  HPI:  Karen Hopkins is a 40 y.o. female here as a referral from Dr. Ardelia Mems for intractable headaches. PMHx of right breast carcinoma, Status post modified radical mastectomy with postoperative radiation therapy In 2014, HTN, diabetes, migraine, depression, anxiety. She has had headaches for years. Since she was a teenager. Has to go into a dark room. The headache is worse with laying down when it is very bad. She has to sit very still. She wakes up with the headaches. Not taking tylenol, advil or naproxen daily no overuse headache. Headache starts on the right around the eye, throbbing, pulsing, and spreads to the other side of the head. She has nausea, no vomiting. She has photophobia, phonophobia. She wakes up with headaches. Snores a lot, kids tell her she excessively snores. She is very tired during the day. She also has cervical dystonia and just received botox. She has a headache several times a week and can last up to 3 days each. At least 15-20 headaches a month and at least 8 are migrainous. This frequency for over a year. Can be severe 10/10 pain. She can be excessively tired during the week but she had a sleep test for OSA neg 06/23/2011.  Reviewed notes, labs and imaging from outside physicians, which showed:   FINDINGS: No evidence for acute infarction,  hemorrhage, mass lesion, hydrocephalus, or extra-axial fluid. Normal cerebral volume.  Subcentimeter focus of FLAIR hyperintensity, RIGHT frontal subcortical white matter, image 16, favored to represent nonspecific focus of ischemic demyelination, or possible post treatment effect. Empty sella. No tonsillar herniation. No osseous lesion. Flow voids are maintained. No chronic hemorrhage.  Post infusion, no abnormal enhancement of brain or meninges.Extracranial soft tissues unremarkable. Shotty cervical adenopathy.  Compared with priors, it is possible that the RIGHT frontal subcortical lesion can be faintly visualized in retrospect. Certainly there is no acute abnormality in that region in 2014.  IMPRESSION: No evidence for metastatic disease to the brain or surrounding structures.  Subcentimeter focus of FLAIR hyperintensity RIGHT frontal subcortical white matter, nonspecific, see discussion above. No evidence for enhancement or restricted diffusion to suggest acuity. Suspect chronic focus of ischemic demyelination or post treatment effect.  CMP unremarkable 04/10/2015  Review of Systems: Patient complains of symptoms per HPI as well as the following symptoms: weight gain, weight loss, fatigue, eye paoin, swelling in legs, feeling hot, feeling cold, joint pain and swelling, constipation, aching muscles, allergies, memory loss, confusion, headache, dizzines, seizure, depression, anxiety, not enough sleep, decreased energy. Pertinent negatives per HPI. All others negative.     Social History   Social History  . Marital status: Single    Spouse name: N/A  . Number of children: 3  . Years of education: 40   Occupational History  . Disability     Social History Main Topics  . Smoking status: Former Smoker    Packs/day: 0.25    Years: 5.00    Quit date: 04/26/2010  . Smokeless tobacco: Never Used  . Alcohol use No  . Drug use: No  . Sexual activity: Not Currently    Birth  control/ protection: Surgical   Other Topics Concern  . Not on file   Social History Narrative   Lives with kids   Caffeine use: none     Family History  Problem Relation Age of Onset  . Hypertension Mother   . Diabetes type II Father   . Prostate cancer Father   . Stroke Neg Hx     Past Medical History:  Diagnosis Date  . Anxiety   . Cancer ([place])   . Depression   . Essential hypertension 11/05/2015  . History of breast cancer 2011   right  . History of chemotherapy 2011  . History of radiation therapy 2011  . Hypertension    under control with med., has been on med. x 2 yr.  . Lymphedema of arm    right; no BP or puncture to right arm  . Seasonal allergies   . Sinus headache     Past Surgical History:  Procedure Laterality Date  . BREAST REDUCTION SURGERY Left 08/23/2013   Procedure: LEFT BREAST REDUCTION  ;  Surgeon: Theodoro Kos, DO;  Location: [place];  Service: Plastics;  Laterality: Left;  . CESAREAN SECTION  07/15/2001; 03/18/2004; 11/21/2008  . LATISSIMUS FLAP TO BREAST Right 03/12/2013   Procedure: RIGHT BREAST LATISSIMUS FLAP WITH EXPANDER PLACEMENT;  Surgeon: Theodoro Kos, DO;  Location: [place];  Service: Plastics;  Laterality: Right;  . LIPOSUCTION Bilateral 08/23/2013   Procedure: LIPOSUCTION;  Surgeon: Theodoro Kos, DO;  Location: [place];  Service: Plastics;  Laterality: Bilateral;  . MODIFIED RADICAL MASTECTOMY Right 03/11/2010  . NASAL SEPTUM  SURGERY    . PORT-A-CATH REMOVAL Left 12/01/2010  . PORTACATH PLACEMENT Left 09/12/2009  . REMOVAL OF TISSUE EXPANDER AND PLACEMENT OF IMPLANT Right 08/23/2013   Procedure: REMOVAL RIGHT TISSUE EXPANDER AND PLACEMENT OF IMPLANT TO RIGHT BREAST ;  Surgeon: Theodoro Kos, DO;  Location: [place];  Service: Plastics;  Laterality: Right;  . SUPRA-UMBILICAL HERNIA  [id number]  . TUBAL LIGATION  11/21/2008    Current Outpatient Prescriptions  Medication Sig Dispense Refill  .  ALPRAZolam (ALPRAZOLAM XR) 1 MG 24 hr tablet Take 1 tablet (1 mg total) by mouth daily. 30 tablet 2  . anastrozole (ARIMIDEX) 1 MG tablet Take 1 tablet (1 mg total) by mouth daily. 90 tablet 4  . carvedilol (COREG) 6.25 MG tablet Take 1 tablet (6.25 mg total) by mouth 2 (two) times daily with a meal. 60 tablet 5  . citalopram (CELEXA) 20 MG tablet TAKE ONE (1) TABLET BY MOUTH EVERY DAY 30 tablet 6  . furosemide (LASIX) 40 MG tablet Take 1 tablet (40 mg total) by mouth daily. 90 tablet 3  . goserelin (ZOLADEX) 3.6 MG injection Inject 3.6 mg into the skin every 28 (twenty-eight) days.    Marland Kitchen losartan (COZAAR) 50 MG tablet Take 1 tablet (50 mg total) by mouth daily. 30 tablet 5  . metFORMIN (GLUCOPHAGE) 500 MG tablet Take 1 tablet (500 mg total) by mouth 2 (two) times daily with a meal. 60 tablet 11  . metoCLOPramide (REGLAN) 10 MG tablet Take 1 tablet (10 mg total) by mouth every 8 (eight) hours as needed. 60 tablet 1  . naproxen (NAPROSYN) 500 MG tablet Take 1 tablet (500 mg total) by mouth 2 (two) times daily with a meal. 60 tablet 2  . oxyCODONE (OXY IR/ROXICODONE) 5 MG immediate release tablet Take 1 tablet (5 mg total) by mouth every 6 (six) hours as needed for severe pain. 120 tablet 0  . polyethylene glycol powder (GLYCOLAX) powder Take 17 g by mouth daily. 500 g 5  . potassium chloride SA (K-DUR,KLOR-CON) 20 MEQ tablet Take 20 mEq by mouth daily.    Marland Kitchen tiZANidine (ZANAFLEX) 4 MG tablet TAKE ONE (1) TABLET BY MOUTH 3 TIMES DAILY 90 tablet 3  . topiramate (TOPAMAX) 50 MG tablet Take 2 tablets (100 mg total) by mouth at bedtime. 60 tablet 12   No current facility-administered medications for this visit.     Allergies as of 01/27/2016 - Review Complete 01/22/2016  Allergen Reaction Noted  . Penicillins Swelling 04/14/2010  . Lyrica [pregabalin] Nausea Only 01/19/2012  . Meloxicam Nausea Only 12/22/2011  . Robaxin [methocarbamol] Nausea Only 12/22/2011    Vitals: BP 134/88 (BP Location:  Left Wrist, Patient Position: Sitting, Cuff Size: Normal)   Pulse 79   Ht 5\' 4"  (1.626 m)   Wt 254 lb 12.8 oz (115.6 kg)   BMI 43.74 kg/m  Last Weight:  Wt Readings from Last 1 Encounters:  01/27/16 254 lb 12.8 oz (115.6 kg)   Last Height:   Ht Readings from Last 1 Encounters:  01/27/16 5\' 4"  (1.626 m)     Physical exam: Exam: Gen: NAD, conversant, well nourised, morbidly obese, well groomed                     CV: RRR, no MRG. No Carotid Bruits. No peripheral edema, warm, nontender Eyes: Conjunctivae clear without exudates or hemorrhage  Neuro: Detailed Neurologic Exam  Speech:    Speech is normal; fluent and spontaneous with  normal comprehension.  Cognition:    The patient is oriented to [place], place, and time;     recent and remote memory intact;     language fluent;     normal attention, concentration,     fund of knowledge Cranial Nerves:    The pupils are equal, round, and reactive to light. The fundi are normal. Visual fields are full to finger confrontation. Extraocular movements are intact. Trigeminal sensation is intact and the muscles of mastication are normal. The face is symmetric. The palate elevates in the midline. Hearing intact. Voice is normal. Shoulder shrug is normal. The tongue has normal motion without fasciculations.   Coordination:    Normal finger to nose and heel to shin. Normal rapid alternating movements.   Gait:    Heel-toe and tandem gait are normal.   Motor Observation:    No asymmetry, no atrophy, and no involuntary movements noted. Tone:    Normal muscle tone.    Posture:    Posture is normal. normal erect    Strength:    Strength is V/V in the upper and lower limbs.      Sensation: intact to LT     Reflex Exam:  DTR's:    Deep tendon reflexes in the upper and lower extremities are normal bilaterally.   Toes:    The toes are downgoing bilaterally.   Clonus:    Clonus is absent.    Assessment/Plan:   40 y.o.  female here as a referral from Dr. Ardelia Mems for intractable headaches. PMHx of right breast carcinoma, Status post modified radical mastectomy with postoperative radiation therapy In 2014, HTN, diabetes, migraine, depression, anxiety. She has chronic migraines. MRI of the brain showed a focus of FLAIR hyperintensity, right frontal subcortical white matter possibly a nonspecific focus of ischemic demyelination, or possible post treatment effect. Also seen was an Empty sella and enlarged lacrimal glands biopsy was negative for sarcoid will follow clinically and repeat MRI brain and orbits in 4-6 months.  additional workup for evaluation of any risk factors for ischemic stroke due to subcortical t2 Extensive lab workup including the following was unremarkable: hiv, rpr, sickle cell, c3/c4 and complement(slughtly elevated), pan-anca, TSH, hemoglobinopathy,  cardiolipin antibodies, prothrombin gene, fV leiden, homocysteine, b2-glycoprotein, lupus, protein c, antithrombin c, ldl (55), ace  Protein s activity and total were slightly elevated. Sed rate slightly elevated. Hgba1c 6.4.   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 Steroid injections performed. Biopsy unremarkable, rules our sarcoid and other etiologies.   Ultrasound carotids normal.   Cardiac event monitor normal.  Continue Daily asa 81mg  Follow closely with pcp for management of vascular risk factors (ldl <70, normotensive BP, hgba1c < 7).  Echo cardiogram read: "- Global LV longitudinal strain -16.2% is mildly reduced and lower than on the previous study (-20%)". Discussed with Dr. Orene Desanctis, will refer to Dr. Haroldine Laws thanks. Continue cardiology follow up.  Increase Topiramate to 150mg  at night for migraines.   Sarina Ill, MD  [facility] [address] [place], [place] [address]  Phone [phone] Fax  [phone]  A total of 30 minutes was spent face-to-face with this patient. Over half this time was spent on counseling patient on the migraine diagnosis and different diagnostic and therapeutic options available.

## 2016-01-27 NOTE — Patient Instructions (Signed)
Remember to drink plenty of fluid, eat healthy meals and do not skip any meals. Try to eat protein with a every meal and eat a healthy snack such as fruit or nuts in between meals. Try to keep a regular sleep-wake schedule and try to exercise daily, particularly in the form of walking, 20-30 minutes a day, if you can.   As far as your medications are concerned, I would like to suggest: Continue aspirin 81mg  daily Increase Topiramate to 150mg  at night  As far as diagnostic testing: repeat MRI brain in 6 months  I would like to see you back in 4-6 months, sooner if we need to. Please call us with any interim questions, concerns, problems, updates or refill requests.   Our phone number is [phone]. We also have an after hours call service for urgent matters and there is a physician on-call for urgent questions. For any emergencies you know to call 911 or go to the nearest emergency room

## 2016-01-28 ENCOUNTER — Ambulatory Visit: Payer: Medicare Other | Admitting: Registered Nurse

## 2016-01-29 ENCOUNTER — Encounter (HOSPITAL_BASED_OUTPATIENT_CLINIC_OR_DEPARTMENT_OTHER): Payer: Medicare Other | Admitting: Physical Medicine & Rehabilitation

## 2016-01-29 VITALS — BP 136/86 | HR 73 | Resp 16

## 2016-01-29 DIAGNOSIS — G243 Spasmodic torticollis: Secondary | ICD-10-CM | POA: Diagnosis not present

## 2016-01-29 DIAGNOSIS — G894 Chronic pain syndrome: Secondary | ICD-10-CM | POA: Diagnosis not present

## 2016-01-29 NOTE — Progress Notes (Signed)
Patient ID: Karen Hopkins, female   DOB: 18-Jun-1975, 40 y.o.   MRN: [id number]   Chief Complaint: Spasmodic Torticollis, Chronic Pain Syndrome: G24.3 G89.4  Medical and Surgical History: Ms. Argie Trier is a 40 year old female with history of right breast carcinoma, Status post modified radical mastectomy with postoperative radiation therapy In 2014. She had subsequent development of chronic right shoulder and right neck pain. She underwent reconstruction with tissue expanders as well as a breast reduction surgery in 2015. She alsoreceived the following treatments trigger point injections, single needle technique with acupuncture needle, dry needilng with physical therapy. Ms.Tollett Received short termrelief with TENS Unit.  Procedure Performed: Percutaneous Electrical Nerve Stimulation ( PENS) # 8.  BP 136/86 (BP Location: Left Wrist, Patient Position: Sitting, Cuff Size: Normal)   Pulse 73   Resp 16   SpO2 98%   Pain Score: 7  Informed Consent:Discussedalternative treatment options, possible risks, potential complications,andtreatment limitations.  Position: The patient was seated with torso at 90 degrees to the legs.  Description: The patient was taken to the exam room where the diagnostic implantation neurostimulation device through placement of needle array electrodes were percutaneously placed after identifying the point of maximal tenderness (Right upper and lower trapezii). The area was cleaned with an alcohol swab and then allowed to air dry. The needle array was removed from the packing and placed over the marked areas. Both thumbs were used to apply perpendicular pressure at greater than 10 pounds of pressure per square inch over the needle array penetrating each through the dermal layer into the subcutaneous layer. The patient was positioned seated upright and instructed on the use of the Biowave device so that the intensity could be controlled through the session.  The device was activated to deliver the electrical signals through the skin into the deep tissue at the pain site. The treatment duration lasted 30 minutes,during which time someone stayed with the pt for the duration of treatment. The pt started at 35% intensity and reached  61 % intensity. At the completion of the treatment the electrodes were removed and placed in the sharps disposal container. The area was cleaned with an alcohol swab. Nobleedingnoted.The patient tolerated the procedure well with no complications. Pre-treatment pain level was 7/10.The patient reports the pain level post treatment was 4/10. Improved ROM and ability to stretch. Pt has completed treatment.     Medication List       Accurate as of 01/29/16 11:06 AM. Always use your most recent med list.          ALPRAZolam 1 MG 24 hr tablet Commonly known as:  ALPRAZOLAM XR Take 1 tablet (1 mg total) by mouth daily.   anastrozole 1 MG tablet Commonly known as:  ARIMIDEX Take 1 tablet (1 mg total) by mouth daily.   carvedilol 6.25 MG tablet Commonly known as:  COREG Take 1 tablet (6.25 mg total) by mouth 2 (two) times daily with a meal.   citalopram 20 MG tablet Commonly known as:  CELEXA TAKE ONE (1) TABLET BY MOUTH EVERY DAY   furosemide 40 MG tablet Commonly known as:  LASIX Take 1 tablet (40 mg total) by mouth daily.   goserelin 3.6 MG injection Commonly known as:  ZOLADEX Inject 3.6 mg into the skin every 28 (twenty-eight) days.   losartan 50 MG tablet Commonly known as:  COZAAR Take 1 tablet (50 mg total) by mouth daily.   metFORMIN 500 MG tablet Commonly known as:  GLUCOPHAGE Take 1 tablet (  500 mg total) by mouth 2 (two) times daily with a meal.   metoCLOPramide 10 MG tablet Commonly known as:  REGLAN Take 1 tablet (10 mg total) by mouth every 8 (eight) hours as needed.   naproxen 500 MG tablet Commonly known as:  NAPROSYN Take 1 tablet (500 mg total) by mouth 2 (two) times daily with a  meal.   oxyCODONE 5 MG immediate release tablet Commonly known as:  Oxy IR/ROXICODONE Take 1 tablet (5 mg total) by mouth every 6 (six) hours as needed for severe pain.   polyethylene glycol powder powder Commonly known as:  GLYCOLAX Take 17 g by mouth daily.   potassium chloride SA 20 MEQ tablet Commonly known as:  K-DUR,KLOR-CON Take 20 mEq by mouth daily.   tiZANidine 4 MG tablet Commonly known as:  ZANAFLEX TAKE ONE (1) TABLET BY MOUTH 3 TIMES DAILY   topiramate 50 MG tablet Commonly known as:  TOPAMAX Take 3 tablets (150 mg total) by mouth at bedtime.      Post Vitals: BP: 134/83 P: 69 Sat's: 97%

## 2016-02-03 ENCOUNTER — Ambulatory Visit (HOSPITAL_BASED_OUTPATIENT_CLINIC_OR_DEPARTMENT_OTHER): Payer: Medicare Other

## 2016-02-04 ENCOUNTER — Other Ambulatory Visit: Payer: Self-pay | Admitting: Family Medicine

## 2016-02-12 ENCOUNTER — Ambulatory Visit: Payer: Medicare Other

## 2016-02-19 ENCOUNTER — Ambulatory Visit: Payer: Medicare Other | Admitting: Oncology

## 2016-02-19 ENCOUNTER — Other Ambulatory Visit: Payer: Medicare Other

## 2016-02-19 ENCOUNTER — Ambulatory Visit: Payer: Medicare Other

## 2016-02-23 ENCOUNTER — Other Ambulatory Visit: Payer: Self-pay | Admitting: *Deleted

## 2016-02-23 ENCOUNTER — Encounter: Payer: Medicare Other | Attending: Registered Nurse | Admitting: Registered Nurse

## 2016-02-23 DIAGNOSIS — M546 Pain in thoracic spine: Secondary | ICD-10-CM | POA: Insufficient documentation

## 2016-02-23 DIAGNOSIS — I890 Lymphedema, not elsewhere classified: Secondary | ICD-10-CM | POA: Insufficient documentation

## 2016-02-24 ENCOUNTER — Encounter: Payer: Self-pay | Admitting: Oncology

## 2016-02-24 ENCOUNTER — Other Ambulatory Visit (HOSPITAL_BASED_OUTPATIENT_CLINIC_OR_DEPARTMENT_OTHER): Payer: Medicare Other

## 2016-02-24 ENCOUNTER — Other Ambulatory Visit: Payer: Self-pay | Admitting: *Deleted

## 2016-02-24 ENCOUNTER — Telehealth: Payer: Self-pay | Admitting: *Deleted

## 2016-02-24 ENCOUNTER — Ambulatory Visit (HOSPITAL_BASED_OUTPATIENT_CLINIC_OR_DEPARTMENT_OTHER): Payer: Medicare Other

## 2016-02-24 ENCOUNTER — Ambulatory Visit (HOSPITAL_BASED_OUTPATIENT_CLINIC_OR_DEPARTMENT_OTHER): Payer: Medicare Other | Admitting: Oncology

## 2016-02-24 VITALS — BP 130/84 | HR 76 | Temp 98.4°F | Resp 18 | Ht 64.0 in | Wt 253.2 lb

## 2016-02-24 VITALS — BP 130/88 | HR 76 | Temp 97.9°F | Resp 20

## 2016-02-24 DIAGNOSIS — C50811 Malignant neoplasm of overlapping sites of right female breast: Secondary | ICD-10-CM

## 2016-02-24 DIAGNOSIS — I890 Lymphedema, not elsewhere classified: Secondary | ICD-10-CM

## 2016-02-24 DIAGNOSIS — Z5111 Encounter for antineoplastic chemotherapy: Secondary | ICD-10-CM

## 2016-02-24 DIAGNOSIS — R52 Pain, unspecified: Secondary | ICD-10-CM | POA: Diagnosis not present

## 2016-02-24 DIAGNOSIS — F329 Major depressive disorder, single episode, unspecified: Secondary | ICD-10-CM | POA: Diagnosis not present

## 2016-02-24 DIAGNOSIS — G8929 Other chronic pain: Secondary | ICD-10-CM | POA: Diagnosis not present

## 2016-02-24 DIAGNOSIS — C50911 Malignant neoplasm of unspecified site of right female breast: Secondary | ICD-10-CM

## 2016-02-24 LAB — COMPREHENSIVE METABOLIC PANEL
ALT: 13 U/L (ref 0–55)
AST: 10 U/L (ref 5–34)
Albumin: 3.5 g/dL (ref 3.5–5.0)
Alkaline Phosphatase: 115 U/L (ref 40–150)
Anion Gap: 10 mEq/L (ref 3–11)
BILIRUBIN TOTAL: 0.31 mg/dL (ref 0.20–1.20)
BUN: 11.7 mg/dL (ref 7.0–26.0)
CHLORIDE: 109 meq/L (ref 98–109)
CO2: 23 meq/L (ref 22–29)
Calcium: 9.2 mg/dL (ref 8.4–10.4)
Creatinine: 1 mg/dL (ref 0.6–1.1)
EGFR: 79 mL/min/{1.73_m2} — AB (ref >90)
GLUCOSE: 99 mg/dL (ref 70–140)
Potassium: 3.1 mEq/L — ABNORMAL LOW (ref 3.5–5.1)
SODIUM: 142 meq/L (ref 136–145)
TOTAL PROTEIN: 8 g/dL (ref 6.4–8.3)

## 2016-02-24 LAB — CBC WITH DIFFERENTIAL/PLATELET
BASO%: 0.2 % (ref 0.0–2.0)
Basophils Absolute: 0 10*3/uL (ref 0.0–0.1)
EOS ABS: 0.3 10*3/uL (ref 0.0–0.5)
EOS%: 2.8 % (ref 0.0–7.0)
HCT: 36.9 % (ref 34.8–46.6)
HGB: 12.1 g/dL (ref 11.6–15.9)
LYMPH%: 33.7 % (ref 14.0–49.7)
MCH: 27.6 pg (ref 25.1–34.0)
MCHC: 32.8 g/dL (ref 31.5–36.0)
MCV: 84.1 fL (ref 79.5–101.0)
MONO#: 0.6 10*3/uL (ref 0.1–0.9)
MONO%: 6.3 % (ref 0.0–14.0)
NEUT%: 57 % (ref 38.4–76.8)
NEUTROS ABS: 5.1 10*3/uL (ref 1.5–6.5)
Platelets: 321 10*3/uL (ref 145–400)
RBC: 4.39 10*6/uL (ref 3.70–5.45)
RDW: 14.5 % (ref 11.2–14.5)
WBC: 9 10*3/uL (ref 3.9–10.3)
lymph#: 3 10*3/uL (ref 0.9–3.3)

## 2016-02-24 LAB — DRAW EXTRA CLOT TUBE

## 2016-02-24 MED ORDER — ANASTROZOLE 1 MG PO TABS: 1.0000 mg | ORAL_TABLET | Freq: Every day | ORAL | 4 refills | Status: DC

## 2016-02-24 MED ORDER — GOSERELIN ACETATE 3.6 MG ~~LOC~~ IMPL
3.6000 mg | DRUG_IMPLANT | Freq: Once | SUBCUTANEOUS | Status: AC
  Administered 2016-02-24: 3.6 mg via SUBCUTANEOUS
  Filled 2016-02-24: qty 3.6

## 2016-02-24 NOTE — Progress Notes (Signed)
Mr.thank youIDErilyn Pearman Hopkins   DOB: 1975-06-17  MR#: [id number]  YTK#:[id number]  PCP: Karen Netters, MD GYN: SU: Karen Coho, MD;  Karen Kos, MD OTHER MD: Karen Penna, MD;  Karen Nakayama, MD  CHIEF COMPLAINT:  Locally advanced right breast cancer  CURRENT TREATMENT: Tamoxifen; goserelin  HISTORY OF PRESENT ILLNESS: From the original intake note:  The patient noted swelling and redness, as well as some tenderness, over her right breast for some time. She thought this might be related to pregnancy, since she delivered her third child about 8 months ago. As the problem did not improve, she brought it to her primary care physician's attention. He treated her with Bactrim, which she says she could not tolerate because of nausea and vomiting, but which in any case did not resolve the problem, so he set her up for mammography at [facility] 08/26/2009. Dr. Sadie Hopkins found by exam marked skin thickening and erythema of the right breast extending pretty much throughout the breast. By mammography there were scattered fibroglandular densities, and an asymmetric density in the right upper outer quadrant with suspicious microcalcifications. There were also enlarged right axillary lymph nodes. The left appeared normal. By ultrasound there was an ill defined area of hypoechoic tissue in the right upper outer quadrant that was difficult to measure. There were also abnormal right axillary lymph nodes.  Ultrasound guided biopsy was performed the same day, and showed [phone]) a poorly differentiated invasive ductal carcinoma, which was estrogen receptor poor at 3%, progesterone receptor positive at 14% with an elevated proliferation marker at 50%, but importantly with strong amplification of HER-2 by CISH with a ratio of 5.20.  Bilateral breast MRIs were obtained 09/02/2009. This showed an area up to 10.6 cm in the right breast showing confluent mass and non-mass enhancement. There was marked  skin thickening involving the entire right breast, and numerous bulky right axillary lymph nodes were identified. The left breast was unremarkable, and there was no evidence of internal mammary lymph node involvement.  Patient was treated in the neoadjuvant setting with 4 dose dense cycles of doxorubicin and cyclophosphamide, followed by 12 weekly doses of paclitaxel and trastuzumab. Karen Hopkins underwent definitive right modified radical mastectomy in October 2011, with a residual 2.5 mm area of tumor in the breast, grade 3, and one of 21 lymph nodes involved.   Her subsequent history is as detailed below  INTERVAL HISTORY: Karen Hopkins returns today for follow-up of her estrogen receptor positive breast cancer. She is being treated with anastrozole and goserelin she tolerates the treatment well. Hot flashes and vaginal dryness are not major concerns. She obtains anastrozole at a good price.  REVIEW OF SYSTEMS: Karen Hopkins continues to have chronic pain issues and is benefiting from the pain clinic involvement. She tells me she has just started counseling and although she has only had one session she is hopeful this will make a difference. She sleeps poorly. She is very fatigued all the time. She describes her pain is a stabbing throbbing and aching and constant. In addition she has some sinus problems. She feels forgetful. She feels anxious and depressed. She has had a variety of studies. Including carotid ultrasounds, MRI of the brain and orbits and MRA of the head and so on, with no evidence of cancer. She had a recent left lateral eyelid surgery which went well and apparently this was for sarcoid. I do not have those records. A detailed review of systems today was otherwise stable  PAST MEDICAL HISTORY:  Past Medical History:  Diagnosis Date  . Anxiety   . Cancer ([place])   . Depression   . Essential hypertension 11/05/2015  . History of breast cancer 2011   right  . History of chemotherapy 2011  . History of  radiation therapy 2011  . Hypertension    under control with med., has been on med. x 2 yr.  . Lymphedema of arm    right; no BP or puncture to right arm  . Seasonal allergies   . Sinus headache     PAST SURGICAL HISTORY: Past Surgical History:  Procedure Laterality Date  . BREAST REDUCTION SURGERY Left 08/23/2013   Procedure: LEFT BREAST REDUCTION  ;  Surgeon: Karen Kos, DO;  Location: [facility];  Service: Plastics;  Laterality: Left;  . CESAREAN SECTION  07/15/2001; 03/18/2004; 11/21/2008  . LATISSIMUS FLAP TO BREAST Right 03/12/2013   Procedure: RIGHT BREAST LATISSIMUS FLAP WITH EXPANDER PLACEMENT;  Surgeon: Karen Kos, DO;  Location: [facility];  Service: Plastics;  Laterality: Right;  . LIPOSUCTION Bilateral 08/23/2013   Procedure: LIPOSUCTION;  Surgeon: Karen Kos, DO;  Location: [facility];  Service: Plastics;  Laterality: Bilateral;  . MODIFIED RADICAL MASTECTOMY Right 03/11/2010  . NASAL SEPTUM SURGERY    . PORT-A-CATH REMOVAL Left 12/01/2010  . PORTACATH PLACEMENT Left 09/12/2009  . REMOVAL OF TISSUE EXPANDER AND PLACEMENT OF IMPLANT Right 08/23/2013   Procedure: REMOVAL RIGHT TISSUE EXPANDER AND PLACEMENT OF IMPLANT TO RIGHT BREAST ;  Surgeon: Karen Kos, DO;  Location: [facility];  Service: Plastics;  Laterality: Right;  . SUPRA-UMBILICAL HERNIA  0762  . TUBAL LIGATION  11/21/2008    FAMILY HISTORY Family History  Problem Relation Age of Onset  . Hypertension Mother   . Diabetes type II Father   . Prostate cancer Father   . Stroke Neg Hx   There is no history of breast or ovarian cancer in the immediate family, but of the patient's mother's mother's six sisters, two (the patient's great-aunts) had ovarian cancer.  GYNECOLOGIC HISTORY:   (Updated 11/26/2013) The patient is GX, P3. First child was premature. Age at first delivery, 40 years old.   she status post tubal ligation. She continues to have menstrual cycles, although  irregularly.  SOCIAL HISTORY: (Updated 11/26/2013) She is currently disabled. Karen Hopkins's children are Karen Hopkins, and Karen Hopkins. They are 14, 12 and 7 as of September 2017. They are all boys, all at home. The patient attends a non-denominational church in [place], which she considers her home, but she is currently living in [place].   ADVANCED DIRECTIVES: not in place  HEALTH MAINTENANCE:  (Updated 11/26/2013) Social History  Substance Use Topics  . Smoking status: Former Smoker    Packs/day: 0.25    Years: 5.00    Quit date: 04/26/2010  . Smokeless tobacco: Never Used  . Alcohol use No    Colonoscopy: Never  PAP: April 2015  Bone density: Never  Lipid panel: August 2014    Allergies  Allergen Reactions  . Penicillins Swelling    FACIAL SWELLING  . Lyrica [Pregabalin] Nausea Only    .  Marland Kitchen Meloxicam Nausea Only  . Robaxin [Methocarbamol] Nausea Only    Current Outpatient Prescriptions  Medication Sig Dispense Refill  . ALPRAZolam (ALPRAZOLAM XR) 1 MG 24 hr tablet Take 1 tablet (1 mg total) by mouth daily. 30 tablet 2  . anastrozole (ARIMIDEX) 1 MG tablet Take 1 tablet (1 mg total) by mouth daily. [place]  tablet 4  . carvedilol (COREG) 6.25 MG tablet Take 1 tablet (6.25 mg total) by mouth 2 (two) times daily with a meal. 60 tablet 5  . citalopram (CELEXA) 20 MG tablet TAKE ONE (1) TABLET BY MOUTH EVERY DAY 30 tablet 6  . furosemide (LASIX) 40 MG tablet Take 1 tablet (40 mg total) by mouth daily. 90 tablet 3  . goserelin (ZOLADEX) 3.6 MG injection Inject 3.6 mg into the skin every 28 (twenty-eight) days.    Marland Kitchen losartan (COZAAR) 50 MG tablet Take 1 tablet (50 mg total) by mouth daily. 30 tablet 5  . metFORMIN (GLUCOPHAGE) 500 MG tablet TAKE ONE (1) TABLET BY MOUTH TWO (2) TIMES DAILY WITH A MEAL 60 tablet 5  . metoCLOPramide (REGLAN) 10 MG tablet Take 1 tablet (10 mg total) by mouth every 8 (eight) hours as needed. 60 tablet 1  . naproxen (NAPROSYN) 500 MG tablet Take 1 tablet  (500 mg total) by mouth 2 (two) times daily with a meal. 60 tablet 2  . oxyCODONE (OXY IR/ROXICODONE) 5 MG immediate release tablet Take 1 tablet (5 mg total) by mouth every 6 (six) hours as needed for severe pain. 120 tablet 0  . polyethylene glycol powder (GLYCOLAX) powder Take 17 g by mouth daily. 500 g 5  . potassium chloride SA (K-DUR,KLOR-CON) 20 MEQ tablet Take 20 mEq by mouth daily.    Marland Kitchen tiZANidine (ZANAFLEX) 4 MG tablet TAKE ONE (1) TABLET BY MOUTH 3 TIMES DAILY 90 tablet 3  . topiramate (TOPAMAX) 50 MG tablet Take 3 tablets (150 mg total) by mouth at bedtime. 90 tablet 12   No current facility-administered medications for this visit.     OBJECTIVE: Young African American woman  Vitals:   02/24/16 1155  BP: 130/84  Pulse: 76  Resp: 18  Temp: 98.4 F (36.9 C)     Body mass index is 43.46 kg/m.    ECOG FS: 2   Filed Weights   02/24/16 1155  Weight: 253 lb 3.2 oz (114.9 kg)   Sclerae unicteric, EOMs intact, no obvious left lateral eyelid scar Oropharynx clear and moist No cervical or supraclavicular adenopathy Lungs no rales or rhonchi Heart regular rate and rhythm Abd soft, obese, nontender, positive bowel sounds MSK no focal spinal tenderness, no upper extremity lymphedema Neuro: nonfocal, well oriented, appropriate affect Breasts: The right breast is status post mastectomy with implant reconstruction. There is some asymmetry as compared to the left breast, but no evidence of disease recurrence. The right axilla is benign. The left breast is unremarkable  LAB RESULTS: Lab Results  Component Value Date   WBC 9.0 02/24/2016   NEUTROABS 5.1 02/24/2016   HGB 12.1 02/24/2016   HCT 36.9 02/24/2016   MCV 84.1 02/24/2016   PLT 321 02/24/2016      Chemistry      Component Value Date/Time   NA 139 11/14/2015 1249   NA 144 10/30/2015 1422   K 3.6 11/14/2015 1249   K 3.6 10/30/2015 1422   CL 107 11/14/2015 1249   CL 105 09/27/2012 1044   CO2 26 11/14/2015 1249    CO2 24 10/30/2015 1422   BUN 11 11/14/2015 1249   BUN 14.5 10/30/2015 1422   CREATININE 0.75 11/14/2015 1249   CREATININE 1.1 10/30/2015 1422      Component Value Date/Time   CALCIUM 9.2 11/14/2015 1249   CALCIUM 9.3 10/30/2015 1422   ALKPHOS 78 10/30/2015 1422   AST 10 10/30/2015 1422   ALT 16  10/30/2015 1422   BILITOT <0.30 10/30/2015 1422     STUDIES: NEUROIMAGING REPORT    STUDY DATE: 10/06/2015 PATIENT NAME: Elaynah Virginia Forstner DOB: April 26, 1976 MRN: [id number]  EXAM: Intracranial MR venogram  ORDERING CLINICIAN: Sarina Ill M.D. CLINICAL HISTORY: 40 year old woman with superior ophthalmic vein enlargement and papilledema COMPARISON FILMS: none  TECHNIQUE: MR venogram of the head was obtained utilizing 2D-TOF sequences from the skull base to the vertex.  Computerized reconstructions were obtained. CONTRAST: None IMAGING SITE: [facility], [address] [place], [place], [place]  FINDINGS: This study is of adequate technical quality.  Flow signal of the superior sagittal sinus has no stenosis.  The right transverse and sigmoid sinuses and right internal jugular vein have no stenosis. The left transverse and sigmoid sinuses and left internal jugular vein have no stenosis.  The deep cerebral veins, great vein of [place]en and straight sinus have no stenosis.   IMPRESSION:  This is a normal MR venogram of the intracranial veins and sinuses.   INTERPRETING PHYSICIAN:  Richard A. Felecia Shelling, MD, PhD Certified in  [place] by [place] of [employer]   ASSESSMENT: 40 y.o.  BRCA1 and BRCA2 negative [place] woman, status post right breast biopsy in March 2011 for a high-grade invasive ductal carcinoma involving overlapping sites of the right breast, ER positive 3%, PR positive 14%, strongly HER2/neu positive with MIB-1 of 15%. Biopsy proven lymph node involvement at presentation. Changes consistent with inflammatory carcinoma.   1. Treated neoadjuvantly,  status post 4 dose-dense cycles of doxorubicin/cyclophosphamide, followed by 12 weekly doses of paclitaxel with trastuzumab completed September 2011.   2. Trastuzumab was then continued for a total of 1 year [to June 2012]. Post-treatment echo showed a well-preserved ejection fraction.   3. Status post right modified radical mastectomy in October 2011 showing a ypT1a ypN1 invasive ductal carcinoma, grade 3.  Status post right reconstruction with implant, March 2015   4. postmastectomy radiation completed in January 2012  5. on tamoxifen beginning January of 2012 stopped somewhere in Spring/Summer 2016  (a) anastrozole started November 2016.  6. other problems include chronic postsurgical pain, chronic right upper extremity lymphedema, and chronic depression  7. Goserelin started 09/24/2014, repeated every 4 weeks  PLAN: Lateesha is now 6 years out from definitive surgery for her breast cancer with no evidence of disease recurrence. This is very favorable.  She is tolerating the anastrozole and goserelin without any significant side effects. The plan is to continue these to a total of 10 years.  She is clearly benefiting from her pain clinic involvement and hopefully will also benefit from the counseling that was just started.  She is dissatisfied with her reconstruction and is considering having a revision.  She knows to call for any problems that may develop before her next visit here.  Chauncey Cruel, MD  02/24/2016

## 2016-02-24 NOTE — Telephone Encounter (Signed)
"  Today I didn't know I had an appointment and need to reschedule."  Called collaborative nurse.  MD will work patient in.  Says she can arrive within 15 mins, is on her way.

## 2016-02-24 NOTE — Patient Instructions (Signed)
Goserelin injection What is this medicine? GOSERELIN (GOE se rel in) is similar to a hormone found in the body. It lowers the amount of sex hormones that the body makes. Men will have lower testosterone levels and women will have lower estrogen levels while taking this medicine. In men, this medicine is used to treat prostate cancer; the injection is either given once per month or once every 12 weeks. A once per month injection (only) is used to treat women with endometriosis, dysfunctional uterine bleeding, or advanced breast cancer. This medicine may be used for other purposes; ask your health care provider or pharmacist if you have questions. What should I tell my health care provider before I take this medicine? They need to know if you have any of these conditions (some only apply to women): -diabetes -heart disease or previous heart attack -high blood pressure -high cholesterol -kidney disease -osteoporosis or low bone density -problems passing urine -spinal cord injury -stroke -tobacco smoker -an unusual or allergic reaction to goserelin, hormone therapy, other medicines, foods, dyes, or preservatives -pregnant or trying to get pregnant -breast-feeding How should I use this medicine? This medicine is for injection under the skin. It is given by a health care professional in a hospital or clinic setting. Men receive this injection once every 4 weeks or once every 12 weeks. Women will only receive the once every 4 weeks injection. Talk to your pediatrician regarding the use of this medicine in children. Special care may be needed. Overdosage: If you think you have taken too much of this medicine contact a poison control center or emergency room at once. NOTE: This medicine is only for you. Do not share this medicine with others. What if I miss a dose? It is important not to miss your dose. Call your doctor or health care professional if you are unable to keep an appointment. What may  interact with this medicine? -female hormones like estrogen -herbal or dietary supplements like black cohosh, chasteberry, or DHEA -female hormones like testosterone -prasterone This list may not describe all possible interactions. Give your health care provider a list of all the medicines, herbs, non-prescription drugs, or dietary supplements you use. Also tell them if you smoke, drink alcohol, or use illegal drugs. Some items may interact with your medicine. What should I watch for while using this medicine? Visit your doctor or health care professional for regular checks on your progress. Your symptoms may appear to get worse during the first weeks of this therapy. Tell your doctor or healthcare professional if your symptoms do not start to get better or if they get worse after this time. Your bones may get weaker if you take this medicine for a long time. If you smoke or frequently drink alcohol you may increase your risk of bone loss. A family history of osteoporosis, chronic use of drugs for seizures (convulsions), or corticosteroids can also increase your risk of bone loss. Talk to your doctor about how to keep your bones strong. This medicine should stop regular monthly menstration in women. Tell your doctor if you continue to menstrate. Women should not become pregnant while taking this medicine or for 12 weeks after stopping this medicine. Women should inform their doctor if they wish to become pregnant or think they might be pregnant. There is a potential for serious side effects to an unborn child. Talk to your health care professional or pharmacist for more information. Do not breast-feed an infant while taking this medicine. Men should   inform their doctors if they wish to father a child. This medicine may lower sperm counts. Talk to your health care professional or pharmacist for more information. What side effects may I notice from receiving this medicine? Side effects that you should  report to your doctor or health care professional as soon as possible: -allergic reactions like skin rash, itching or hives, swelling of the face, lips, or tongue -bone pain -breathing problems -changes in vision -chest pain -feeling faint or lightheaded, falls -fever, chills -pain, swelling, warmth in the leg -pain, tingling, numbness in the hands or feet -signs and symptoms of low blood pressure like dizziness; feeling faint or lightheaded, falls; unusually weak or tired -stomach pain -swelling of the ankles, feet, hands -trouble passing urine or change in the amount of urine -unusually high or low blood pressure -unusually weak or tired Side effects that usually do not require medical attention (report to your doctor or health care professional if they continue or are bothersome): -change in sex drive or performance -changes in breast size in both males and females -changes in emotions or moods -headache -hot flashes -irritation at site where injected -loss of appetite -skin problems like acne, dry skin -vaginal dryness This list may not describe all possible side effects. Call your doctor for medical advice about side effects. You may report side effects to FDA at 1-800-FDA-1088. Where should I keep my medicine? This drug is given in a hospital or clinic and will not be stored at home. NOTE: This sheet is a summary. It may not cover all possible information. If you have questions about this medicine, talk to your doctor, pharmacist, or health care provider.    2016, Elsevier/Gold Standard. (2013-07-31 11:10:35)  

## 2016-02-25 ENCOUNTER — Encounter: Payer: Self-pay | Admitting: Registered Nurse

## 2016-02-25 ENCOUNTER — Encounter (HOSPITAL_BASED_OUTPATIENT_CLINIC_OR_DEPARTMENT_OTHER): Payer: Medicare Other | Admitting: Registered Nurse

## 2016-02-25 VITALS — BP 121/82 | HR 80 | Resp 17

## 2016-02-25 DIAGNOSIS — M546 Pain in thoracic spine: Secondary | ICD-10-CM | POA: Diagnosis present

## 2016-02-25 DIAGNOSIS — M545 Low back pain, unspecified: Secondary | ICD-10-CM

## 2016-02-25 DIAGNOSIS — I890 Lymphedema, not elsewhere classified: Secondary | ICD-10-CM

## 2016-02-25 DIAGNOSIS — M797 Fibromyalgia: Secondary | ICD-10-CM

## 2016-02-25 DIAGNOSIS — F329 Major depressive disorder, single episode, unspecified: Secondary | ICD-10-CM

## 2016-02-25 DIAGNOSIS — Z79899 Other long term (current) drug therapy: Secondary | ICD-10-CM

## 2016-02-25 DIAGNOSIS — G894 Chronic pain syndrome: Secondary | ICD-10-CM

## 2016-02-25 DIAGNOSIS — I676 Nonpyogenic thrombosis of intracranial venous system: Secondary | ICD-10-CM | POA: Diagnosis not present

## 2016-02-25 DIAGNOSIS — Z5181 Encounter for therapeutic drug level monitoring: Secondary | ICD-10-CM

## 2016-02-25 DIAGNOSIS — F32A Depression, unspecified: Secondary | ICD-10-CM

## 2016-02-25 DIAGNOSIS — M7918 Myalgia, other site: Secondary | ICD-10-CM

## 2016-02-25 MED ORDER — OXYCODONE HCL 5 MG PO TABS: 5.0000 mg | ORAL_TABLET | Freq: Four times a day (QID) | ORAL | 0 refills | Status: DC | PRN

## 2016-02-25 NOTE — Progress Notes (Signed)
Subjective:    Patient ID: Karen Hopkins, female    DOB: September 04, 1975, 40 y.o.   MRN: [id number]  HPI:  [name] is a 40 year old female who returns for follow up appointment for chronic pain and medication refill. She states her pain is located in her neck,right shoulder and mid- lower back.Also noticed increased swelling in right arm, will refer to Lymphedema clinic.She rates her pain 6. Her current exercise regime is walking and performing stretching exercises. Karen Hopkins has started counseling at [facility] weekly, counselor is Mickel Baas.   Pain Inventory Average Pain 7 Pain Right Now 6 My pain is sharp, dull, stabbing and aching  In the last 24 hours, has pain interfered with the following? General activity 6 Relation with others 6 Enjoyment of life 6 What TIME of day is your pain at its worst? morning, daytime, night Sleep (in general) Poor  Pain is worse with: bending, sitting, inactivity and some activites Pain improves with: therapy/exercise, medication, TENS and injections Relief from Meds: 4  Mobility how many minutes can you walk? 10 ability to climb steps?  yes do you drive?  yes  Function Do you have any goals in this area?  yes  Neuro/Psych numbness tingling spasms depression anxiety  Prior Studies Any changes since last visit?  no  Physicians involved in your care Any changes since last visit?  no   Family History  Problem Relation Age of Onset  . Hypertension Mother   . Diabetes type II Father   . Prostate cancer Father   . Stroke Neg Hx    Social History   Social History  . Marital status: Single    Spouse name: N/A  . Number of children: 3  . Years of education: 59   Occupational History  . Disability     Social History Main Topics  . Smoking status: Former Smoker    Packs/day: 0.25    Years: 5.00    Quit date: 04/26/2010  . Smokeless tobacco: Never Used  . Alcohol use No  . Drug use: No  . Sexual activity: Not  Currently    Birth control/ protection: Surgical   Other Topics Concern  . None   Social History Narrative   Lives with kids   Caffeine use: none    Past Surgical History:  Procedure Laterality Date  . BREAST REDUCTION SURGERY Left 08/23/2013   Procedure: LEFT BREAST REDUCTION  ;  Surgeon: Theodoro Kos, DO;  Location: [place];  Service: Plastics;  Laterality: Left;  . CESAREAN SECTION  07/15/2001; 03/18/2004; 11/21/2008  . LATISSIMUS FLAP TO BREAST Right 03/12/2013   Procedure: RIGHT BREAST LATISSIMUS FLAP WITH EXPANDER PLACEMENT;  Surgeon: Theodoro Kos, DO;  Location: [place];  Service: Plastics;  Laterality: Right;  . LIPOSUCTION Bilateral 08/23/2013   Procedure: LIPOSUCTION;  Surgeon: Theodoro Kos, DO;  Location: [place];  Service: Plastics;  Laterality: Bilateral;  . MODIFIED RADICAL MASTECTOMY Right 03/11/2010  . NASAL SEPTUM SURGERY    . PORT-A-CATH REMOVAL Left 12/01/2010  . PORTACATH PLACEMENT Left 09/12/2009  . REMOVAL OF TISSUE EXPANDER AND PLACEMENT OF IMPLANT Right 08/23/2013   Procedure: REMOVAL RIGHT TISSUE EXPANDER AND PLACEMENT OF IMPLANT TO RIGHT BREAST ;  Surgeon: Theodoro Kos, DO;  Location: [place];  Service: Plastics;  Laterality: Right;  . SUPRA-UMBILICAL HERNIA  123456  . TUBAL LIGATION  11/21/2008   Past Medical History:  Diagnosis Date  . Anxiety   .  Cancer ([place])   . Depression   . Essential hypertension 11/05/2015  . History of breast cancer 2011   right  . History of chemotherapy 2011  . History of radiation therapy 2011  . Hypertension    under control with med., has been on med. x 2 yr.  . Lymphedema of arm    right; no BP or puncture to right arm  . Seasonal allergies   . Sinus headache    BP 121/82   Pulse 80   Resp 17   SpO2 97%   Opioid Risk Score:   Fall Risk Score:  `1  Depression screen PHQ 2/9  Depression screen [facility] 2/9 08/04/2015 06/25/2015 03/04/2015 01/20/2015 01/07/2015 11/21/2014  10/07/2014  Decreased Interest 0 0 0 0 0 0 0  Down, Depressed, Hopeless 0 0 0 0 0 0 0  PHQ - 2 Score 0 0 0 0 0 0 0  Altered sleeping - - - - - - -  Tired, decreased energy - - - - - - -  Change in appetite - - - - - - -  Feeling bad or failure about yourself  - - - - - - -  Trouble concentrating - - - - - - -  Moving slowly or fidgety/restless - - - - - - -  Suicidal thoughts - - - - - - -  PHQ-9 Score - - - - - - -  Some recent data might be hidden    Review of Systems  Constitutional: Positive for unexpected weight change.  Gastrointestinal: Positive for constipation.  Neurological: Positive for numbness.       Tingling Spasms   Psychiatric/Behavioral: Positive for dysphoric mood. The patient is nervous/anxious.   All other systems reviewed and are negative.      Objective:   Physical Exam  Constitutional: She is oriented to [place], place, and time. She appears well-developed and well-nourished.  HENT:  Head: Normocephalic and atraumatic.  Neck: Normal range of motion. Neck supple.  Cardiovascular: Normal rate and regular rhythm.   Pulmonary/Chest: Effort normal and breath sounds normal.  Musculoskeletal:  Normal Muscle Bulk and Muscle Testing Reveals: Upper Extremities: Right: Decreased ROM 45 Degrees with Lymphedema  Left: Full ROM and Muscle Strength 5/5 Thoracic Hypersensitivity Mainly Right Side: T-1-T-9 Lower Extremities: Full ROM and Muscle Strength 5/5 Arises from Table with ease Narrow Based Gait  Neurological: She is alert and oriented to [place], place, and time.  Skin: Skin is warm and dry. Rash noted.  Psychiatric: She has a normal mood and affect.  Nursing note and vitals reviewed.         Assessment & Plan:  1.Myofascial pain syndrome chronic postoperative, as well as post radiation: S/P Breast Reconstruction Surgery x 2.  Refilled: Oxycodone 5 mg one tablet every 6 hours as needed for severe pain.# 120. Second script given for the following month  to Continue HEP. We will continue the opioid monitoring program, this consists of regular clinic visits, examinations, urine drug screen, pill counts as well as use of [place] Controlled Substance Reporting System. 2. Neoplasm Related Pain: Continue Current Medication Regime 3. Obesity: Losing weight continue with Healthy Diet and Exercise Regime.  4. Muscle Spasms: Continue Tizanidine 5. Lymphedema: RX: Lymphedema Rehabilitation  20 minutes of face to face patient care time was spent during this visit. All questions were encouraged and answered

## 2016-03-01 ENCOUNTER — Ambulatory Visit: Payer: Medicare Other | Attending: Registered Nurse | Admitting: Physical Therapy

## 2016-03-01 DIAGNOSIS — M25511 Pain in right shoulder: Secondary | ICD-10-CM | POA: Diagnosis present

## 2016-03-01 DIAGNOSIS — M25611 Stiffness of right shoulder, not elsewhere classified: Secondary | ICD-10-CM | POA: Insufficient documentation

## 2016-03-01 DIAGNOSIS — I972 Postmastectomy lymphedema syndrome: Secondary | ICD-10-CM | POA: Insufficient documentation

## 2016-03-01 NOTE — Therapy (Signed)
[place], [place], [address] Phone: [phone]   Fax:  [phone]  Physical Therapy Evaluation  Patient Details  Name: Karen Hopkins MRN: [id number] Date of Birth: 03/01/1976 Referring Provider: Danella Sensing, NP  Encounter Date: 03/01/2016      PT End of Session - 03/01/16 1036    Visit Number 1   Number of Visits 19   Date for PT Re-Evaluation 04/16/16   PT Start Time 0845   PT Stop Time 0930   PT Time Calculation (min) 45 min   Activity Tolerance Patient tolerated treatment well   Behavior During Therapy [facility] for tasks assessed/performed      Past Medical History:  Diagnosis Date  . Anxiety   . Cancer ([place])   . Depression   . Essential hypertension 11/05/2015  . History of breast cancer 2011   right  . History of chemotherapy 2011  . History of radiation therapy 2011  . Hypertension    under control with med., has been on med. x 2 yr.  . Lymphedema of arm    right; no BP or puncture to right arm  . Seasonal allergies   . Sinus headache     Past Surgical History:  Procedure Laterality Date  . BREAST REDUCTION SURGERY Left 08/23/2013   Procedure: LEFT BREAST REDUCTION  ;  Surgeon: Theodoro Kos, DO;  Location: [place];  Service: Plastics;  Laterality: Left;  . CESAREAN SECTION  07/15/2001; 03/18/2004; 11/21/2008  . LATISSIMUS FLAP TO BREAST Right 03/12/2013   Procedure: RIGHT BREAST LATISSIMUS FLAP WITH EXPANDER PLACEMENT;  Surgeon: Theodoro Kos, DO;  Location: [place];  Service: Plastics;  Laterality: Right;  . LIPOSUCTION Bilateral 08/23/2013   Procedure: LIPOSUCTION;  Surgeon: Theodoro Kos, DO;  Location: [place];  Service: Plastics;  Laterality: Bilateral;  . MODIFIED RADICAL MASTECTOMY Right 03/11/2010  . NASAL SEPTUM SURGERY    . PORT-A-CATH REMOVAL Left 12/01/2010  . PORTACATH PLACEMENT Left 09/12/2009  . REMOVAL OF TISSUE EXPANDER AND PLACEMENT  OF IMPLANT Right 08/23/2013   Procedure: REMOVAL RIGHT TISSUE EXPANDER AND PLACEMENT OF IMPLANT TO RIGHT BREAST ;  Surgeon: Theodoro Kos, DO;  Location: [place];  Service: Plastics;  Laterality: Right;  . SUPRA-UMBILICAL HERNIA  [id number]  . TUBAL LIGATION  11/21/2008    There were no vitals filed for this visit.       Subjective Assessment - 03/01/16 0850    Subjective I've been managing my lymphedema pretty well, but it has gotten a little out of control at the right upper arm. I don't know what is going on. I'm not doing anything different. It hurts and is limiting my ROM. States she still goes to the pain clinic to help with her pain. Has to take her Reed sleeve off in the middle of the night because it is too tight.   Pertinent History Right breast cancer with mastectomy 2011; reconstruction; left breast reduction; h/o right UE lymphedema and right upper quadrant pain, treated in this clinic several times before.   Patient Stated Goals to get swelling under control   Currently in Pain? Yes   Pain Score 7    Pain Location Shoulder   Pain Orientation Right   Pain Descriptors / Indicators Constant;Aching   Pain Type Chronic pain   Aggravating Factors  lifting or stretching arm too far   Pain Relieving Factors reducing the swelling   Effect of Pain on Daily  Activities uses the left arm more to do things            [facility] PT Assessment - 03/01/16 0001      Assessment   Medical Diagnosis right breast cancer   Referring Provider Danella Sensing, NP   Onset Date/Surgical Date 08/06/10  approximate   Hand Dominance Right   Prior Therapy yes     Restrictions   Weight Bearing Restrictions No     Balance Screen   Has the patient fallen in the past 6 months No   Has the patient had a decrease in activity level because of a fear of falling?  No   Is the patient reluctant to leave their home because of a fear of falling?  No     Home Environment   Living Environment  Private residence   Living Arrangements Children  3 sons   Type of [place] Access Level entry   Home Layout One level     Prior Function   Level of Independence Independent   Leisure walks occasionally around the block or around the park, for about 15-20 minutes     Cognition   Overall Cognitive Status Within Functional Limits for tasks assessed     Observation/Other Assessments   Observations noticeable swelling in right upper arm   Other Surveys  --  LLIS: 82% impairment     ROM / Strength   AROM / PROM / Strength AROM     AROM   AROM Assessment Site Shoulder   Right/Left Shoulder Right;Left   Right Shoulder Flexion 88 Degrees   Right Shoulder ABduction 67 Degrees   Left Shoulder Flexion 137 Degrees   Left Shoulder ABduction 123 Degrees     Ambulation/Gait   Ambulation/Gait Yes   Ambulation/Gait Assistance 7: Independent           LYMPHEDEMA/ONCOLOGY QUESTIONNAIRE - 03/01/16 0904      Lymphedema Assessments   Lymphedema Assessments Upper extremities     Right Upper Extremity Lymphedema   15 cm Proximal to Olecranon Process 48 cm   10 cm Proximal to Olecranon Process 48.2 cm   Olecranon Process 32 cm   15 cm Proximal to Ulnar Styloid Process 29.5 cm   10 cm Proximal to Ulnar Styloid Process 25.5 cm   Just Proximal to Ulnar Styloid Process 18.7 cm   Across Hand at [employer] 20.7 cm   At [place] of 2nd Digit 6.3 cm     Left Upper Extremity Lymphedema   15 cm Proximal to Olecranon Process 47 cm   10 cm Proximal to Olecranon Process 44.9 cm   Olecranon Process 30.7 cm   15 cm Proximal to Ulnar Styloid Process 29 cm   10 cm Proximal to Ulnar Styloid Process 25.4 cm   Just Proximal to Ulnar Styloid Process 18 cm   Across Hand at [employer] 21.2 cm   At [place] of 2nd Digit 6.3 cm                           Short Term Clinic Goals - 03/01/16 1047      CC Short Term Goal  #1   Title Decrease circumference measurement at 10  cm proximal to olecranon on the right to 46.6 cm or less.   Baseline 48.2 on right at eval compared to 44.9 on left   Time 3   Period Weeks   Status New  CC Short Term Goal  #2   Title Decrease circumference measurement at right olecranon to 31.3 cm or less   Baseline 32 cm. on right compared to 30.7 on left   Time 3   Period Weeks   Status New             Long Term Clinic Goals - 03/01/16 1050      CC Long Term Goal  #1   Title Patient will achieve right shoulder flexion and abduction AROM of at least 110 degrees to improve performance of ADLs.   Baseline flexion 88 degrees, abduction 67 degrees at eval   Time 6   Period Weeks   Status New     CC Long Term Goal  #2   Title Patient will know how to obtain new compression garments to improve management of lymphedema at home.   Time 6   Period Weeks   Status New     CC Long Term Goal  #3   Title Reduce Lymphedema Life Impact Scale to less than 50 percent impairment.   Baseline 82% impairment at eval   Time 6   Period Weeks   Status New            Plan - 03/01/16 1036    Clinical Impression Statement Patient is known to this clinic and presents today with an increase in her right UE lymphedema. She demonstrates significantly increased circumference measurements in her right UE as compared to the left, especially in her right upper arm. She also demonstrates right shoulder weakness and limited right shoulder AROM.    Rehab Potential Good   Clinical Impairments Affecting Rehab Potential chronic pain issues   PT Frequency 3x / week   PT Duration 6 weeks   PT Treatment/Interventions ADLs/Self Care Home Management;Electrical Stimulation;Therapeutic exercise;DME Instruction;Patient/family education;Manual techniques;Manual lymph drainage;Compression bandaging;Passive range of motion;Visual/perceptual remediation/compensation;Taping   PT Next Visit Plan begin manual lymph drainage and compression bandaging of right UE;  assess nighttime garment for appropriate fit   Consulted and Agree with Plan of Care Patient      Patient will benefit from skilled therapeutic intervention in order to improve the following deficits and impairments:  Decreased range of motion, Decreased strength, Increased edema, Impaired UE functional use, Pain  Visit Diagnosis: Postmastectomy lymphedema - Plan: PT plan of care cert/re-cert  Pain in right shoulder - Plan: PT plan of care cert/re-cert  Stiffness of right shoulder, not elsewhere classified - Plan: PT plan of care cert/re-cert      G-Codes - [id number]    Functional Assessment Tool Used lymphedema life impact scale   Functional Limitation Self care   Self Care Current Status [id number]) At least 80 percent but less than 100 percent impaired, limited or restricted   Self Care Goal Status [id number]) At least 20 percent but less than 40 percent impaired, limited or restricted       Problem List Patient Active Problem List   Diagnosis Date Noted  . Essential hypertension 11/05/2015  . Cervical dystonia 08/25/2015  . Headache 07/09/2015  . Myofacial muscle pain 04/29/2015  . Adhesive capsulitis of right shoulder 04/29/2015  . Type 2 diabetes mellitus without complication ([place]) 123456  . Lower extremity edema 10/01/2014  . Cancer of overlapping sites of right female breast ([place]) 05/24/2014  . Myalgia and myositis 05/17/2014  . Neoplasm related pain 03/12/2014  . CN (constipation) 11/26/2013  . H/O reduction mammoplasty 10/30/2013  . Seizure ([place]) 10/06/2013  . Status post  bilateral breast implants 08/31/2013  . Acquired absence of breast and absent nipple 08/23/2013  . S/P breast reconstruction, right 08/23/2013  . AC joint arthropathy 07/02/2013  . Routine adult health maintenance 05/29/2013  . Hypokalemia 05/21/2013  . Chronic pain 04/19/2013  . Elevated blood sugar 03/09/2013  . Wheezing 02/22/2013  . Acute bronchitis 02/22/2013  . Lymphedema 05/24/2011   . RHINOSINUSITIS, CHRONIC 04/08/2010  . Obesity 08/04/2006  . DEPRESSIVE DISORDER, NOS 08/04/2006  . RHINITIS, ALLERGIC 08/04/2006    SALISBURY,DONNA 03/01/2016, 1:25 PM  [place], [place], [address] Phone: [phone]   Fax:  [phone]  Name: Karen Hopkins MRN: [id number] Date of Birth: Sep 08, 1975  Saverio Danker, SPT  This entire session was guided, instructed, and directly supervised by Serafina Royals, PT.  Read, reviewed, edited and agree with student's findings and recommendations.   Serafina Royals, PT 03/01/16 1:26 PM

## 2016-03-03 ENCOUNTER — Ambulatory Visit: Payer: Medicare Other | Admitting: Neurology

## 2016-03-03 ENCOUNTER — Ambulatory Visit: Payer: Medicare Other

## 2016-03-03 DIAGNOSIS — M25611 Stiffness of right shoulder, not elsewhere classified: Secondary | ICD-10-CM

## 2016-03-03 DIAGNOSIS — I972 Postmastectomy lymphedema syndrome: Secondary | ICD-10-CM

## 2016-03-03 DIAGNOSIS — M25511 Pain in right shoulder: Secondary | ICD-10-CM

## 2016-03-03 NOTE — Therapy (Signed)
[place], [place], [address] Phone: [phone]   Fax:  [phone]  Physical Therapy Treatment  Patient Details  Name: Karen Hopkins MRN: [id number] Date of Birth: [birth year] Referring Provider: Danella Sensing, NP  Encounter Date: 03/03/2016      PT End of Session - 03/03/16 1246    Visit Number 2   Number of Visits 19   Date for PT Re-Evaluation 04/16/16   PT Start Time 0940   PT Stop Time 1024   PT Time Calculation (min) 44 min   Activity Tolerance Patient tolerated treatment well   Behavior During Therapy [facility] for tasks assessed/performed      Past Medical History:  Diagnosis Date  . Anxiety   . Cancer ([place])   . Depression   . Essential hypertension 11/05/2015  . History of breast cancer 2011   right  . History of chemotherapy 2011  . History of radiation therapy 2011  . Hypertension    under control with med., has been on med. x 2 yr.  . Lymphedema of arm    right; no BP or puncture to right arm  . Seasonal allergies   . Sinus headache     Past Surgical History:  Procedure Laterality Date  . BREAST REDUCTION SURGERY Left 08/23/2013   Procedure: LEFT BREAST REDUCTION  ;  Surgeon: Theodoro Kos, DO;  Location: [place];  Service: Plastics;  Laterality: Left;  . CESAREAN SECTION  07/15/2001; 03/18/2004; 11/21/2008  . LATISSIMUS FLAP TO BREAST Right 03/12/2013   Procedure: RIGHT BREAST LATISSIMUS FLAP WITH EXPANDER PLACEMENT;  Surgeon: Theodoro Kos, DO;  Location: [place];  Service: Plastics;  Laterality: Right;  . LIPOSUCTION Bilateral 08/23/2013   Procedure: LIPOSUCTION;  Surgeon: Theodoro Kos, DO;  Location: [place];  Service: Plastics;  Laterality: Bilateral;  . MODIFIED RADICAL MASTECTOMY Right 03/11/2010  . NASAL SEPTUM SURGERY    . PORT-A-CATH REMOVAL Left 12/01/2010  . PORTACATH PLACEMENT Left 09/12/2009  . REMOVAL OF TISSUE EXPANDER AND PLACEMENT OF  IMPLANT Right 08/23/2013   Procedure: REMOVAL RIGHT TISSUE EXPANDER AND PLACEMENT OF IMPLANT TO RIGHT BREAST ;  Surgeon: Theodoro Kos, DO;  Location: [place];  Service: Plastics;  Laterality: Right;  . SUPRA-UMBILICAL HERNIA  123456  . TUBAL LIGATION  11/21/2008    There were no vitals filed for this visit.      Subjective Assessment - 03/03/16 0947    Subjective I have another appointment at [facility] today right after this appointment. I really liked the counselor I spoke with. And I'm ready to get my arm smaller again.    Pertinent History Right breast cancer with mastectomy 2011; reconstruction; left breast reduction; h/o right UE lymphedema and right upper quadrant pain, treated in this clinic several times before.   Patient Stated Goals to get swelling under control   Currently in Pain? Yes   Pain Score 5    Pain Location Shoulder   Pain Orientation Right   Pain Descriptors / Indicators Aching;Constant   Pain Type Chronic pain   Aggravating Factors  reaching too far   Pain Relieving Factors less swelling and my pain meds                         [facility] Adult PT Treatment/Exercise - 03/03/16 0001      Manual Therapy   Manual Lymphatic Drainage (MLD) In Supine: Short neck, superficial and  deep abdominals, Rt inguinal and Lt axillary nodes, Rt axillo-inguinal and anterior inter-axillary anastomosis, then Rt UE from dorsal hand to lateral shoulder to dorsal hand working proximal to distal then retracing all steps.   Compression Bandaging Lotion applied, thick stockinette, Artiflex x1, 1-6, 1-10 and 2-12 cm short stretch compression bandages from hand to axilla (first applied 12 cm bandage in herring bone).                   Short Term Clinic Goals - 03/01/16 1047      CC Short Term Goal  #1   Title Decrease circumference measurement at 10 cm proximal to olecranon on the right to 46.6 cm or less.   Baseline 48.2 on right at eval compared to  44.9 on left   Time 3   Period Weeks   Status New     CC Short Term Goal  #2   Title Decrease circumference measurement at right olecranon to 31.3 cm or less   Baseline 32 cm. on right compared to 30.7 on left   Time 3   Period Weeks   Status New             Long Term Clinic Goals - 03/01/16 1050      CC Long Term Goal  #1   Title Patient will achieve right shoulder flexion and abduction AROM of at least 110 degrees to improve performance of ADLs.   Baseline flexion 88 degrees, abduction 67 degrees at eval   Time 6   Period Weeks   Status New     CC Long Term Goal  #2   Title Patient will know how to obtain new compression garments to improve management of lymphedema at home.   Time 6   Period Weeks   Status New     CC Long Term Goal  #3   Title Reduce Lymphedema Life Impact Scale to less than 50 percent impairment.   Baseline 82% impairment at eval   Time 6   Period Weeks   Status New            Plan - 03/03/16 1250    Clinical Impression Statement Pt tolerated first session well with ni complaints after bandaging of pain or tingling in limb. She has another appt with counselor at [facility] today and pt was looking forward to seeing the same counselor as she liked her. Pt reports even having lost some weight is frustrated her arm isn't getting smaller so is looking to reductions and increasing her Rt shoulder ROM with less pain this episode of care.   Rehab Potential Good   Clinical Impairments Affecting Rehab Potential chronic pain issues   PT Frequency 3x / week   PT Duration 6 weeks   PT Treatment/Interventions ADLs/Self Care Home Management;Electrical Stimulation;Therapeutic exercise;DME Instruction;Patient/family education;Manual techniques;Manual lymph drainage;Compression bandaging;Passive range of motion;Visual/perceptual remediation/compensation;Taping   PT Next Visit Plan Cont manual lymph drainage and compression bandaging of right UE and begin ROM of Rt  UE; assess nighttime garment for appropriate fit   Consulted and Agree with Plan of Care Patient      Patient will benefit from skilled therapeutic intervention in order to improve the following deficits and impairments:  Decreased range of motion, Decreased strength, Increased edema, Impaired UE functional use, Pain  Visit Diagnosis: Postmastectomy lymphedema  Pain in right shoulder  Stiffness of right shoulder, not elsewhere classified     Problem List Patient Active Problem List   Diagnosis Date  Noted  . Essential hypertension 11/05/2015  . Cervical dystonia 08/25/2015  . Headache 07/09/2015  . Myofacial muscle pain 04/29/2015  . Adhesive capsulitis of right shoulder 04/29/2015  . Type 2 diabetes mellitus without complication ([place]) 123456  . Lower extremity edema 10/01/2014  . Cancer of overlapping sites of right female breast ([place]) 05/24/2014  . Myalgia and myositis 05/17/2014  . Neoplasm related pain 03/12/2014  . CN (constipation) 11/26/2013  . H/O reduction mammoplasty 10/30/2013  . Seizure ([place]) 10/06/2013  . Status post bilateral breast implants 08/31/2013  . Acquired absence of breast and absent nipple 08/23/2013  . S/P breast reconstruction, right 08/23/2013  . AC joint arthropathy 07/02/2013  . Routine adult health maintenance 05/29/2013  . Hypokalemia 05/21/2013  . Chronic pain 04/19/2013  . Elevated blood sugar 03/09/2013  . Wheezing 02/22/2013  . Acute bronchitis 02/22/2013  . Lymphedema 05/24/2011  . RHINOSINUSITIS, CHRONIC 04/08/2010  . Obesity 08/04/2006  . DEPRESSIVE DISORDER, NOS 08/04/2006  . RHINITIS, ALLERGIC 08/04/2006    Otelia Limes, PTA 03/03/2016, 12:56 PM  [place], [place], [address] Phone: [phone]   Fax:  [phone]  Name: Karen Hopkins MRN: [id number] Date of Birth: [birth year]

## 2016-03-05 ENCOUNTER — Ambulatory Visit: Payer: Medicare Other | Admitting: Physical Therapy

## 2016-03-05 DIAGNOSIS — M25511 Pain in right shoulder: Secondary | ICD-10-CM

## 2016-03-05 DIAGNOSIS — I972 Postmastectomy lymphedema syndrome: Secondary | ICD-10-CM | POA: Diagnosis not present

## 2016-03-05 DIAGNOSIS — M25611 Stiffness of right shoulder, not elsewhere classified: Secondary | ICD-10-CM

## 2016-03-05 NOTE — Therapy (Signed)
[place], [place], [address] Phone: [phone]   Fax:  [phone]  Physical Therapy Treatment  Patient Details  Name: Karen Hopkins MRN: [id number] Date of Birth: 11/19/1975 Referring Provider: Danella Sensing, NP  Encounter Date: 03/05/2016      PT End of Session - 03/05/16 0901    Visit Number 3   Number of Visits 19   Date for PT Re-Evaluation 04/16/16   PT Start Time [id number]  pt arrives 15 mintues late    PT Stop Time 0934   PT Time Calculation (min) 37 min      Past Medical History:  Diagnosis Date  . Anxiety   . Cancer ([place])   . Depression   . Essential hypertension 11/05/2015  . History of breast cancer 2011   right  . History of chemotherapy 2011  . History of radiation therapy 2011  . Hypertension    under control with med., has been on med. x 2 yr.  . Lymphedema of arm    right; no BP or puncture to right arm  . Seasonal allergies   . Sinus headache     Past Surgical History:  Procedure Laterality Date  . BREAST REDUCTION SURGERY Left 08/23/2013   Procedure: LEFT BREAST REDUCTION  ;  Surgeon: Theodoro Kos, DO;  Location: [facility];  Service: Plastics;  Laterality: Left;  . CESAREAN SECTION  07/15/2001; 03/18/2004; 11/21/2008  . LATISSIMUS FLAP TO BREAST Right 03/12/2013   Procedure: RIGHT BREAST LATISSIMUS FLAP WITH EXPANDER PLACEMENT;  Surgeon: Theodoro Kos, DO;  Location: [facility];  Service: Plastics;  Laterality: Right;  . LIPOSUCTION Bilateral 08/23/2013   Procedure: LIPOSUCTION;  Surgeon: Theodoro Kos, DO;  Location: [facility];  Service: Plastics;  Laterality: Bilateral;  . MODIFIED RADICAL MASTECTOMY Right 03/11/2010  . NASAL SEPTUM SURGERY    . PORT-A-CATH REMOVAL Left 12/01/2010  . PORTACATH PLACEMENT Left 09/12/2009  . REMOVAL OF TISSUE EXPANDER AND PLACEMENT OF IMPLANT Right 08/23/2013   Procedure: REMOVAL RIGHT TISSUE EXPANDER AND PLACEMENT OF  IMPLANT TO RIGHT BREAST ;  Surgeon: Theodoro Kos, DO;  Location: [facility];  Service: Plastics;  Laterality: Right;  . SUPRA-UMBILICAL HERNIA  [id number]  . TUBAL LIGATION  11/21/2008    There were no vitals filed for this visit.      Subjective Assessment - 03/05/16 0859    Subjective Hectic morning. She kept the bandages on until this morning.    Pertinent History Right breast cancer with mastectomy 2011; reconstruction; left breast reduction; h/o right UE lymphedema and right upper quadrant pain, treated in this clinic several times before.   Patient Stated Goals to get swelling under control   Currently in Pain? Yes   Pain Score 6    Pain Location Shoulder  pt still goes to the pain clinic                          [facility] Adult PT Treatment/Exercise - 03/05/16 0001      Shoulder Exercises: Supine   Other Supine Exercises dowel rod flexion      Manual Therapy   Manual Lymphatic Drainage (MLD) In Supine: Short neck, superficial and deep abdominals, Rt inguinal and Lt axillary nodes, Rt axillo-inguinal and anterior inter-axillary anastomosis, then Rt UE from dorsal hand to lateral shoulder to dorsal hand working proximal to distal then retracing all steps.   Compression Bandaging Lotion applied,  thick stockinette, Artiflex x1, 1-6, 1-10 and 2-12 cm short stretch compression bandages from hand to axilla (first applied 12 cm bandage in herring bone).                   Short Term Clinic Goals - 03/01/16 1047      CC Short Term Goal  #1   Title Decrease circumference measurement at 10 cm proximal to olecranon on the right to 46.6 cm or less.   Baseline 48.2 on right at eval compared to 44.9 on left   Time 3   Period Weeks   Status New     CC Short Term Goal  #2   Title Decrease circumference measurement at right olecranon to 31.3 cm or less   Baseline 32 cm. on right compared to 30.7 on left   Time 3   Period Weeks   Status New              Long Term Clinic Goals - 03/01/16 1050      CC Long Term Goal  #1   Title Patient will achieve right shoulder flexion and abduction AROM of at least 110 degrees to improve performance of ADLs.   Baseline flexion 88 degrees, abduction 67 degrees at eval   Time 6   Period Weeks   Status New     CC Long Term Goal  #2   Title Patient will know how to obtain new compression garments to improve management of lymphedema at home.   Time 6   Period Weeks   Status New     CC Long Term Goal  #3   Title Reduce Lymphedema Life Impact Scale to less than 50 percent impairment.   Baseline 82% impairment at eval   Time 6   Period Weeks   Status New            Plan - 03/05/16 [id number]    Clinical Impression Statement Pt continues to have pain in neck and shoulder.  She toerated the first session of bandaging well, but still has fullness in her upper arm.  She says she only uses her Flexitouch to only once a week, she was encouraged to use it every day once her bandages She did not realize she had to use the Flexi for the rest of her life she calls it her "alien Machine" She says she just wants to be "normal" again and is waiting for something new to come out so that she could move better while she has it on.    Clinical Impairments Affecting Rehab Potential chronic pain issues   PT Next Visit Plan Cont manual lymph drainage and compression bandaging of right UE and begin ROM of Rt UE; assess nighttime garment for appropriate fit-- encouraged pt to bring it in next time she says she forgot it today       Patient will benefit from skilled therapeutic intervention in order to improve the following deficits and impairments:  Decreased range of motion, Decreased strength, Increased edema, Impaired UE functional use, Pain  Visit Diagnosis: Postmastectomy lymphedema  Pain in right shoulder  Stiffness of right shoulder, not elsewhere classified     Problem List Patient Active  Problem List   Diagnosis Date Noted  . Essential hypertension 11/05/2015  . Cervical dystonia 08/25/2015  . Headache 07/09/2015  . Myofacial muscle pain 04/29/2015  . Adhesive capsulitis of right shoulder 04/29/2015  . Type 2 diabetes mellitus without complication ([place]) 123456  . Lower  extremity edema 10/01/2014  . Cancer of overlapping sites of right female breast ([place]) 05/24/2014  . Myalgia and myositis 05/17/2014  . Neoplasm related pain 03/12/2014  . CN (constipation) 11/26/2013  . H/O reduction mammoplasty 10/30/2013  . Seizure ([place]) 10/06/2013  . Status post bilateral breast implants 08/31/2013  . Acquired absence of breast and absent nipple 08/23/2013  . S/P breast reconstruction, right 08/23/2013  . AC joint arthropathy 07/02/2013  . Routine adult health maintenance 05/29/2013  . Hypokalemia 05/21/2013  . Chronic pain 04/19/2013  . Elevated blood sugar 03/09/2013  . Wheezing 02/22/2013  . Acute bronchitis 02/22/2013  . Lymphedema 05/24/2011  . RHINOSINUSITIS, CHRONIC 04/08/2010  . Obesity 08/04/2006  . DEPRESSIVE DISORDER, NOS 08/04/2006  . RHINITIS, ALLERGIC 08/04/2006   Donato Heinz. Owens Shark PT  Norwood Levo 03/05/2016, 9:34 AM  [place], [place], [address] Phone: [phone]   Fax:  [phone]  Name: Karen Hopkins MRN: [id number] Date of Birth: Sep 20, 1975

## 2016-03-08 ENCOUNTER — Ambulatory Visit: Payer: Medicare Other | Attending: Registered Nurse | Admitting: Physical Therapy

## 2016-03-08 DIAGNOSIS — I972 Postmastectomy lymphedema syndrome: Secondary | ICD-10-CM | POA: Diagnosis present

## 2016-03-08 DIAGNOSIS — M25511 Pain in right shoulder: Secondary | ICD-10-CM | POA: Insufficient documentation

## 2016-03-08 DIAGNOSIS — M25611 Stiffness of right shoulder, not elsewhere classified: Secondary | ICD-10-CM | POA: Insufficient documentation

## 2016-03-08 DIAGNOSIS — G8929 Other chronic pain: Secondary | ICD-10-CM | POA: Insufficient documentation

## 2016-03-08 NOTE — Therapy (Signed)
[place], [place], [address] Phone: [phone]   Fax:  [phone]  Physical Therapy Treatment  Patient Details  Name: Karen Hopkins MRN: [id number] Date of Birth: 25-Jul-1975 Referring Provider: Danella Sensing, NP  Encounter Date: 03/08/2016      PT End of Session - 03/08/16 1027    Visit Number 4   Number of Visits 19   Date for PT Re-Evaluation 04/16/16   PT Start Time 0805   PT Stop Time 0850   PT Time Calculation (min) 45 min   Activity Tolerance Patient tolerated treatment well   Behavior During Therapy [facility] for tasks assessed/performed      Past Medical History:  Diagnosis Date  . Anxiety   . Cancer ([place])   . Depression   . Essential hypertension 11/05/2015  . History of breast cancer 2011   right  . History of chemotherapy 2011  . History of radiation therapy 2011  . Hypertension    under control with med., has been on med. x 2 yr.  . Lymphedema of arm    right; no BP or puncture to right arm  . Seasonal allergies   . Sinus headache     Past Surgical History:  Procedure Laterality Date  . BREAST REDUCTION SURGERY Left 08/23/2013   Procedure: LEFT BREAST REDUCTION  ;  Surgeon: Theodoro Kos, DO;  Location: [place];  Service: Plastics;  Laterality: Left;  . CESAREAN SECTION  07/15/2001; 03/18/2004; 11/21/2008  . LATISSIMUS FLAP TO BREAST Right 03/12/2013   Procedure: RIGHT BREAST LATISSIMUS FLAP WITH EXPANDER PLACEMENT;  Surgeon: Theodoro Kos, DO;  Location: [place];  Service: Plastics;  Laterality: Right;  . LIPOSUCTION Bilateral 08/23/2013   Procedure: LIPOSUCTION;  Surgeon: Theodoro Kos, DO;  Location: [place];  Service: Plastics;  Laterality: Bilateral;  . MODIFIED RADICAL MASTECTOMY Right 03/11/2010  . NASAL SEPTUM SURGERY    . PORT-A-CATH REMOVAL Left 12/01/2010  . PORTACATH PLACEMENT Left 09/12/2009  . REMOVAL OF TISSUE EXPANDER AND PLACEMENT OF  IMPLANT Right 08/23/2013   Procedure: REMOVAL RIGHT TISSUE EXPANDER AND PLACEMENT OF IMPLANT TO RIGHT BREAST ;  Surgeon: Theodoro Kos, DO;  Location: [place];  Service: Plastics;  Laterality: Right;  . SUPRA-UMBILICAL HERNIA  123456  . TUBAL LIGATION  11/21/2008    There were no vitals filed for this visit.      Subjective Assessment - 03/08/16 0808    Subjective States the bandages came off the same day they were put on. I think I needed the Herringbone up the arm.   Pertinent History Right breast cancer with mastectomy 2011; reconstruction; left breast reduction; h/o right UE lymphedema and right upper quadrant pain, treated in this clinic several times before.   Patient Stated Goals to get swelling under control   Currently in Pain? Yes   Pain Score 8   haven't taken pain pills today   Pain Location Shoulder   Pain Orientation Right   Pain Descriptors / Indicators Aching;Constant   Pain Type Chronic pain                         [facility] Adult PT Treatment/Exercise - 03/08/16 0001      Manual Therapy   Manual Lymphatic Drainage (MLD) In Supine: Short neck, superficial and deep abdominals, Rt inguinal and Lt axillary nodes, Rt axillo-inguinal and anterior inter-axillary anastomosis, then Rt UE from lateral shoulder to  dorsal hand working proximal to distal then retracing all steps.   Compression Bandaging Lotion applied, thick stockinette, Elastomull to thumb only, Artiflex x2, 1-6, 1-10 and 3-12 cm short stretch compression bandages from hand to axilla (applied 12 cm bandages in herring bone starting at upper arm).                   Short Term Clinic Goals - 03/01/16 1047      CC Short Term Goal  #1   Title Decrease circumference measurement at 10 cm proximal to olecranon on the right to 46.6 cm or less.   Baseline 48.2 on right at eval compared to 44.9 on left   Time 3   Period Weeks   Status New     CC Short Term Goal  #2   Title  Decrease circumference measurement at right olecranon to 31.3 cm or less   Baseline 32 cm. on right compared to 30.7 on left   Time 3   Period Weeks   Status New             Long Term Clinic Goals - 03/01/16 1050      CC Long Term Goal  #1   Title Patient will achieve right shoulder flexion and abduction AROM of at least 110 degrees to improve performance of ADLs.   Baseline flexion 88 degrees, abduction 67 degrees at eval   Time 6   Period Weeks   Status New     CC Long Term Goal  #2   Title Patient will know how to obtain new compression garments to improve management of lymphedema at home.   Time 6   Period Weeks   Status New     CC Long Term Goal  #3   Title Reduce Lymphedema Life Impact Scale to less than 50 percent impairment.   Baseline 82% impairment at eval   Time 6   Period Weeks   Status New            Plan - 03/08/16 1027    Clinical Impression Statement Patient reports bandages came off the same day they were applied last session. She thinks they were too loose because they weren't applied in Herringbone pattern. She also states her thumb seems a little swollen today, so therapist used Elastomull around her thumb only today. Therapist added an extra 12 inch short stretch bandage today and all 12 inch bandages were applied in Herringbone pattern.   Rehab Potential Good   Clinical Impairments Affecting Rehab Potential chronic pain issues   PT Frequency 3x / week   PT Duration 6 weeks   PT Treatment/Interventions ADLs/Self Care Home Management;Electrical Stimulation;Therapeutic exercise;DME Instruction;Patient/family education;Manual techniques;Manual lymph drainage;Compression bandaging;Passive range of motion;Visual/perceptual remediation/compensation;Taping   PT Next Visit Plan Reassess goals and circumference measurements; cont manual lymph drainage and compression bandaging of right UE and later begin ROM of Rt UE; assess nighttime garment for  appropriate fit when patient brings this in   Consulted and Agree with Plan of Care Patient      Patient will benefit from skilled therapeutic intervention in order to improve the following deficits and impairments:  Decreased range of motion, Decreased strength, Increased edema, Impaired UE functional use, Pain  Visit Diagnosis: Postmastectomy lymphedema  Chronic right shoulder pain  Stiffness of right shoulder, not elsewhere classified     Problem List Patient Active Problem List   Diagnosis Date Noted  . Essential hypertension 11/05/2015  . Cervical dystonia 08/25/2015  .  Headache 07/09/2015  . Myofacial muscle pain 04/29/2015  . Adhesive capsulitis of right shoulder 04/29/2015  . Type 2 diabetes mellitus without complication ([place]) 123456  . Lower extremity edema 10/01/2014  . Cancer of overlapping sites of right female breast ([place]) 05/24/2014  . Myalgia and myositis 05/17/2014  . Neoplasm related pain 03/12/2014  . CN (constipation) 11/26/2013  . H/O reduction mammoplasty 10/30/2013  . Seizure ([place]) 10/06/2013  . Status post bilateral breast implants 08/31/2013  . Acquired absence of breast and absent nipple 08/23/2013  . S/P breast reconstruction, right 08/23/2013  . AC joint arthropathy 07/02/2013  . Routine adult health maintenance 05/29/2013  . Hypokalemia 05/21/2013  . Chronic pain 04/19/2013  . Elevated blood sugar 03/09/2013  . Wheezing 02/22/2013  . Acute bronchitis 02/22/2013  . Lymphedema 05/24/2011  . RHINOSINUSITIS, CHRONIC 04/08/2010  . Obesity 08/04/2006  . DEPRESSIVE DISORDER, NOS 08/04/2006  . RHINITIS, ALLERGIC 08/04/2006    Mellody Life 03/08/2016, 10:33 AM  [place], [place], [address] Phone: [phone]   Fax:  [phone]  Name: Tynleigh Agustin Monahan MRN: [id number] Date of Birth: 1976/04/07   Saverio Danker, SPT  This entire session was  guided, instructed, and directly supervised by Serafina Royals, PT.  Read, reviewed, edited and agree with student's findings and recommendations.   Serafina Royals, PT 03/08/16 5:08 PM

## 2016-03-10 ENCOUNTER — Ambulatory Visit: Payer: Medicare Other | Admitting: Physical Therapy

## 2016-03-10 DIAGNOSIS — I972 Postmastectomy lymphedema syndrome: Secondary | ICD-10-CM

## 2016-03-10 DIAGNOSIS — G8929 Other chronic pain: Secondary | ICD-10-CM

## 2016-03-10 DIAGNOSIS — M25511 Pain in right shoulder: Secondary | ICD-10-CM

## 2016-03-10 NOTE — Therapy (Signed)
[place], [place], [address] Phone: [phone]   Fax:  [phone]  Physical Therapy Treatment  Patient Details  Name: Karen Hopkins MRN: [id number] Date of Birth: 18-Oct-1975 Referring Provider: Danella Sensing, NP  Encounter Date: 03/10/2016      PT End of Session - 03/10/16 1302    Visit Number 5   Number of Visits 19   Date for PT Re-Evaluation 04/16/16   PT Start Time 1110   PT Stop Time 1155   PT Time Calculation (min) 45 min   Activity Tolerance Patient tolerated treatment well   Behavior During Therapy [facility] for tasks assessed/performed      Past Medical History:  Diagnosis Date  . Anxiety   . Cancer ([place])   . Depression   . Essential hypertension 11/05/2015  . History of breast cancer 2011   right  . History of chemotherapy 2011  . History of radiation therapy 2011  . Hypertension    under control with med., has been on med. x 2 yr.  . Lymphedema of arm    right; no BP or puncture to right arm  . Seasonal allergies   . Sinus headache     Past Surgical History:  Procedure Laterality Date  . BREAST REDUCTION SURGERY Left 08/23/2013   Procedure: LEFT BREAST REDUCTION  ;  Surgeon: Theodoro Kos, DO;  Location: [place];  Service: Plastics;  Laterality: Left;  . CESAREAN SECTION  07/15/2001; 03/18/2004; 11/21/2008  . LATISSIMUS FLAP TO BREAST Right 03/12/2013   Procedure: RIGHT BREAST LATISSIMUS FLAP WITH EXPANDER PLACEMENT;  Surgeon: Theodoro Kos, DO;  Location: [place];  Service: Plastics;  Laterality: Right;  . LIPOSUCTION Bilateral 08/23/2013   Procedure: LIPOSUCTION;  Surgeon: Theodoro Kos, DO;  Location: [place];  Service: Plastics;  Laterality: Bilateral;  . MODIFIED RADICAL MASTECTOMY Right 03/11/2010  . NASAL SEPTUM SURGERY    . PORT-A-CATH REMOVAL Left 12/01/2010  . PORTACATH PLACEMENT Left 09/12/2009  . REMOVAL OF TISSUE EXPANDER AND PLACEMENT OF  IMPLANT Right 08/23/2013   Procedure: REMOVAL RIGHT TISSUE EXPANDER AND PLACEMENT OF IMPLANT TO RIGHT BREAST ;  Surgeon: Theodoro Kos, DO;  Location: [place];  Service: Plastics;  Laterality: Right;  . SUPRA-UMBILICAL HERNIA  0086  . TUBAL LIGATION  11/21/2008    There were no vitals filed for this visit.      Subjective Assessment - 03/10/16 1115    Subjective Bandages in herringbone did a little better, but had a sore spot at top so took the bandages off list night to get some sleep.  The arm looked good afterward.   Currently in Pain? Yes   Pain Score 7    Pain Location Shoulder   Pain Orientation Right   Pain Relieving Factors stretching               LYMPHEDEMA/ONCOLOGY QUESTIONNAIRE - 03/10/16 1116      Right Upper Extremity Lymphedema   15 cm Proximal to Olecranon Process 45.3 cm   10 cm Proximal to Olecranon Process 45.1 cm   Olecranon Process 31.5 cm   15 cm Proximal to Ulnar Styloid Process 30 cm   10 cm Proximal to Ulnar Styloid Process 25.6 cm   Just Proximal to Ulnar Styloid Process 18.2 cm   Across Hand at [employer] 20.5 cm   At [place] of 2nd Digit 6 cm  [facility] Adult PT Treatment/Exercise - 03/10/16 0001      Manual Therapy   Manual Therapy Edema management   Edema Management circumference measurements taken   Manual Lymphatic Drainage (MLD) In Supine: Short neck, superficial and deep abdominals, Rt inguinal and Lt axillary nodes, Rt axillo-inguinal and anterior inter-axillary anastomosis, then Rt UE from lateral shoulder to dorsal hand working proximal to distal then retracing all steps.   Compression Bandaging Lotion applied, thin stockinette, Elastomull to thumb only, Artiflex x2, 1-6, 1-10 and 3-12 cm short stretch compression bandages from hand to axilla (applied 12 cm bandages in herring bone starting at upper arm with first 12-cm. bandage and then from wrist to shoulder with 2nd and 3rd 12 cm. bandages).                    Short Term Clinic Goals - 03/10/16 1306      CC Short Term Goal  #1   Title Decrease circumference measurement at 10 cm proximal to olecranon on the right to 46.6 cm or less.   Status Achieved     CC Short Term Goal  #2   Title Decrease circumference measurement at right olecranon to 31.3 cm or less   Status Partially [place] Clinic Goals - 03/01/16 1050      CC Long Term Goal  #1   Title Patient will achieve right shoulder flexion and abduction AROM of at least 110 degrees to improve performance of ADLs.   Baseline flexion 88 degrees, abduction 67 degrees at eval   Time 6   Period Weeks   Status New     CC Long Term Goal  #2   Title Patient will know how to obtain new compression garments to improve management of lymphedema at home.   Time 6   Period Weeks   Status New     CC Long Term Goal  #3   Title Reduce Lymphedema Life Impact Scale to less than 50 percent impairment.   Baseline 82% impairment at eval   Time 6   Period Weeks   Status New            Plan - 03/10/16 1303    Clinical Impression Statement Bandages in herringbone pattern did better but patient had one spot of discomfort.  Circumference measurements are significantly down at upper arm today (2.5-3 cm.).  She plans to call the Medicaid garment vendor, whom she has worked with before, to see about whether she can be fitted for a new sleeve soon.  Meanwhile, we will continue to bandage.   Rehab Potential Good   Clinical Impairments Affecting Rehab Potential chronic pain issues   PT Frequency 3x / week   PT Duration 6 weeks   PT Treatment/Interventions ADLs/Self Care Home Management;Electrical Stimulation;Therapeutic exercise;DME Instruction;Patient/family education;Manual techniques;Manual lymph drainage;Compression bandaging;Passive range of motion;Visual/perceptual remediation/compensation;Taping   PT Next Visit Plan cont manual lymph drainage and  compression bandaging of right UE and later begin ROM of Rt UE; assess nighttime garment for appropriate fit when patient brings this in   Consulted and Agree with Plan of Care Patient      Patient will benefit from skilled therapeutic intervention in order to improve the following deficits and impairments:  Decreased range of motion, Decreased strength, Increased edema, Impaired UE functional use, Pain  Visit Diagnosis: Postmastectomy lymphedema  Chronic right shoulder pain     Problem List Patient  Active Problem List   Diagnosis Date Noted  . Essential hypertension 11/05/2015  . Cervical dystonia 08/25/2015  . Headache 07/09/2015  . Myofacial muscle pain 04/29/2015  . Adhesive capsulitis of right shoulder 04/29/2015  . Type 2 diabetes mellitus without complication ([place]) 47/84/1282  . Lower extremity edema 10/01/2014  . Cancer of overlapping sites of right female breast ([place]) 05/24/2014  . Myalgia and myositis 05/17/2014  . Neoplasm related pain 03/12/2014  . CN (constipation) 11/26/2013  . H/O reduction mammoplasty 10/30/2013  . Seizure ([place]) 10/06/2013  . Status post bilateral breast implants 08/31/2013  . Acquired absence of breast and absent nipple 08/23/2013  . S/P breast reconstruction, right 08/23/2013  . AC joint arthropathy 07/02/2013  . Routine adult health maintenance 05/29/2013  . Hypokalemia 05/21/2013  . Chronic pain 04/19/2013  . Elevated blood sugar 03/09/2013  . Wheezing 02/22/2013  . Acute bronchitis 02/22/2013  . Lymphedema 05/24/2011  . RHINOSINUSITIS, CHRONIC 04/08/2010  . Obesity 08/04/2006  . DEPRESSIVE DISORDER, NOS 08/04/2006  . RHINITIS, ALLERGIC 08/04/2006    Pollie Poma 03/10/2016, 1:08 PM  [place], [place], [address] Phone: [phone]   Fax:  [phone]  Name: Mitzi Lilja Maille MRN: [id number] Date of Birth: 1976/03/23  Serafina Royals,  PT 03/10/16 1:08 PM

## 2016-03-12 ENCOUNTER — Ambulatory Visit: Payer: Medicare Other | Admitting: Physical Therapy

## 2016-03-12 DIAGNOSIS — G8929 Other chronic pain: Secondary | ICD-10-CM

## 2016-03-12 DIAGNOSIS — M25611 Stiffness of right shoulder, not elsewhere classified: Secondary | ICD-10-CM

## 2016-03-12 DIAGNOSIS — M25511 Pain in right shoulder: Secondary | ICD-10-CM

## 2016-03-12 DIAGNOSIS — I972 Postmastectomy lymphedema syndrome: Secondary | ICD-10-CM | POA: Diagnosis not present

## 2016-03-12 NOTE — Therapy (Signed)
[facility] [address] [place], [place], [address] Phone: [phone]   Fax:  [phone]  Physical Therapy Treatment  Patient Details  Name: Karen Hopkins MRN: [id number] Date of Birth: 06/23/1975 Referring Provider: Eunice Thomas, NP  Encounter Date: 03/12/2016      PT End of Session - 03/12/16 1151    Visit Number 6   Number of Visits 19   Date for PT Re-Evaluation 04/16/16   PT Start Time 1103   PT Stop Time 1145   PT Time Calculation (min) 42 min   Activity Tolerance Patient tolerated treatment well   Behavior During Therapy WFL for tasks assessed/performed      Past Medical History:  Diagnosis Date  . Anxiety   . Cancer (HCC)   . Depression   . Essential hypertension 11/05/2015  . History of breast cancer 2011   right  . History of chemotherapy 2011  . History of radiation therapy 2011  . Hypertension    under control with med., has been on med. x 2 yr.  . Lymphedema of arm    right; no BP or puncture to right arm  . Seasonal allergies   . Sinus headache     Past Surgical History:  Procedure Laterality Date  . BREAST REDUCTION SURGERY Left 08/23/2013   Procedure: LEFT BREAST REDUCTION  ;  Surgeon: Claire Sanger, DO;  Location: [facility];  Service: Plastics;  Laterality: Left;  . CESAREAN SECTION  07/15/2001; 03/18/2004; 11/21/2008  . LATISSIMUS FLAP TO BREAST Right 03/12/2013   Procedure: RIGHT BREAST LATISSIMUS FLAP WITH EXPANDER PLACEMENT;  Surgeon: Claire Sanger, DO;  Location: [facility];  Service: Plastics;  Laterality: Right;  . LIPOSUCTION Bilateral 08/23/2013   Procedure: LIPOSUCTION;  Surgeon: Claire Sanger, DO;  Location: [facility];  Service: Plastics;  Laterality: Bilateral;  . MODIFIED RADICAL MASTECTOMY Right 03/11/2010  . NASAL SEPTUM SURGERY    . PORT-A-CATH REMOVAL Left 12/01/2010  . PORTACATH PLACEMENT Left 09/12/2009  . REMOVAL OF TISSUE EXPANDER AND PLACEMENT OF  IMPLANT Right 08/23/2013   Procedure: REMOVAL RIGHT TISSUE EXPANDER AND PLACEMENT OF IMPLANT TO RIGHT BREAST ;  Surgeon: Claire Sanger, DO;  Location: [facility];  Service: Plastics;  Laterality: Right;  . SUPRA-UMBILICAL HERNIA  2006  . TUBAL LIGATION  11/21/2008    There were no vitals filed for this visit.      Subjective Assessment - 03/12/16 1106    Subjective Pt states she did not do well with thin stockinette; she did not reduce as much and her skin got itchy.  She needs to use the thick stockinette.     Pertinent History Right breast cancer with mastectomy 2011; reconstruction; left breast reduction; h/o right UE lymphedema and right upper quadrant pain, treated in this clinic several times before.   Patient Stated Goals to get swelling under control   Currently in Pain? Yes   Pain Score 7    Pain Location Shoulder   Pain Orientation Right   Pain Descriptors / Indicators Aching;Constant   Pain Type Chronic pain                         [facility] Adult PT Treatment/Exercise - 03/12/16 0001      Manual Therapy   Manual Lymphatic Drainage (MLD) In Supine: Short neck, superficial and deep abdominals, Rt inguinal and Lt axillary nodes, Rt axillo-inguinal and anterior inter-axillary anastomosis, then Rt UE   from lateral shoulder to dorsal hand working proximal to distal then retracing all steps.   Compression Bandaging Lotion applied, thick kinette, did not wrap finger today , Artiflex x2, 1-6, 1-10 and 3-12 cm short stretch compression bandages from hand to axilla (applied 12 cm bandages in herring bone starting at upper arm with first 12-cm. bandage and then from wrist to shoulder with 2nd and 3rd 12 cm. bandages).                   Short Term Clinic Goals - 03/10/16 1306      CC Short Term Goal  #1   Title Decrease circumference measurement at 10 cm proximal to olecranon on the right to 46.6 cm or less.   Status Achieved     CC Short Term  Goal  #2   Title Decrease circumference measurement at right olecranon to 31.3 cm or less   Status Partially [facility] Goals - 03/01/16 1050      CC Long Term Goal  #1   Title Patient will achieve right shoulder flexion and abduction AROM of at least 110 degrees to improve performance of ADLs.   Baseline flexion 88 degrees, abduction 67 degrees at eval   Time 6   Period Weeks   Status New     CC Long Term Goal  #2   Title Patient will know how to obtain new compression garments to improve management of lymphedema at home.   Time 6   Period Weeks   Status New     CC Long Term Goal  #3   Title Reduce Lymphedema Life Impact Scale to less than 50 percent impairment.   Baseline 82% impairment at eval   Time 6   Period Weeks   Status New            Plan - 03/12/16 1152    Clinical Impression Statement Pt states she needs to use thick stockinette.  She was encouraged to call Medicaid vendor to schedule garment measurement.  She says she will put her night time sleeve in her car to bring it in to see if it will still work for her    Rehab Potential Good   Clinical Impairments Affecting Rehab Potential chronic pain issues   PT Frequency 3x / week   PT Duration 6 weeks   PT Treatment/Interventions ADLs/Self Care Home Management;Electrical Stimulation;Therapeutic exercise;DME Instruction;Patient/family education;Manual techniques;Manual lymph drainage;Compression bandaging;Passive range of motion;Visual/perceptual remediation/compensation;Taping   PT Next Visit Plan cont manual lymph drainage and compression bandaging of right UE and later begin ROM of Rt UE; assess nighttime garment for appropriate fit when patient brings this in check to see if pt has contacted Medicaid    Consulted and Agree with Plan of Care Patient      Patient will benefit from skilled therapeutic intervention in order to improve the following deficits and impairments:  Decreased  range of motion, Decreased strength, Increased edema, Impaired UE functional use, Pain  Visit Diagnosis: Postmastectomy lymphedema  Chronic right shoulder pain  Stiffness of right shoulder, not elsewhere classified     Problem List Patient Active Problem List   Diagnosis Date Noted  . Essential hypertension 11/05/2015  . Cervical dystonia 08/25/2015  . Headache 07/09/2015  . Myofacial muscle pain 04/29/2015  . Adhesive capsulitis of right shoulder 04/29/2015  . Type 2 diabetes mellitus without complication ([place]) 12/75/1700  . Lower extremity  edema 10/01/2014  . Cancer of overlapping sites of right female breast (HCC) 05/24/2014  . Myalgia and myositis 05/17/2014  . Neoplasm related pain 03/12/2014  . CN (constipation) 11/26/2013  . H/O reduction mammoplasty 10/30/2013  . Seizure (HCC) 10/06/2013  . Status post bilateral breast implants 08/31/2013  . Acquired absence of breast and absent nipple 08/23/2013  . S/P breast reconstruction, right 08/23/2013  . AC joint arthropathy 07/02/2013  . Routine adult health maintenance 05/29/2013  . Hypokalemia 05/21/2013  . Chronic pain 04/19/2013  . Elevated blood sugar 03/09/2013  . Wheezing 02/22/2013  . Acute bronchitis 02/22/2013  . Lymphedema 05/24/2011  . RHINOSINUSITIS, CHRONIC 04/08/2010  . Obesity 08/04/2006  . DEPRESSIVE DISORDER, NOS 08/04/2006  . RHINITIS, ALLERGIC 08/04/2006   Teresa K. Brown, PT  Brown, Teresa Krall 03/12/2016, 11:57 AM   [facility] [address] [place], , [address] Phone: [phone]   Fax:  [phone]  Name: Joni D Gartrell MRN: [id number] Date of Birth: 07/09/1975    

## 2016-03-15 ENCOUNTER — Ambulatory Visit: Payer: Medicare Other | Admitting: Physical Therapy

## 2016-03-17 ENCOUNTER — Ambulatory Visit: Payer: Medicare Other

## 2016-03-17 DIAGNOSIS — I972 Postmastectomy lymphedema syndrome: Secondary | ICD-10-CM | POA: Diagnosis not present

## 2016-03-17 NOTE — Therapy (Signed)
[place], [place], [address] Phone: [phone]   Fax:  [phone]  Physical Therapy Treatment  Patient Details  Name: Karen Hopkins MRN: [id number] Date of Birth: [birth year] Referring Provider: Danella Sensing, NP  Encounter Date: 03/17/2016      PT End of Session - 03/17/16 1613    Visit Number 7   Number of Visits 19   Date for PT Re-Evaluation 04/16/16   PT Start Time 1520   PT Stop Time 1605   PT Time Calculation (min) 45 min   Activity Tolerance Patient tolerated treatment well   Behavior During Therapy [facility] for tasks assessed/performed      Past Medical History:  Diagnosis Date  . Anxiety   . Cancer ([place])   . Depression   . Essential hypertension 11/05/2015  . History of breast cancer 2011   right  . History of chemotherapy 2011  . History of radiation therapy 2011  . Hypertension    under control with med., has been on med. x 2 yr.  . Lymphedema of arm    right; no BP or puncture to right arm  . Seasonal allergies   . Sinus headache     Past Surgical History:  Procedure Laterality Date  . BREAST REDUCTION SURGERY Left 08/23/2013   Procedure: LEFT BREAST REDUCTION  ;  Surgeon: Theodoro Kos, DO;  Location: [facility];  Service: Plastics;  Laterality: Left;  . CESAREAN SECTION  07/15/2001; 03/18/2004; 11/21/2008  . LATISSIMUS FLAP TO BREAST Right 03/12/2013   Procedure: RIGHT BREAST LATISSIMUS FLAP WITH EXPANDER PLACEMENT;  Surgeon: Theodoro Kos, DO;  Location: [facility];  Service: Plastics;  Laterality: Right;  . LIPOSUCTION Bilateral 08/23/2013   Procedure: LIPOSUCTION;  Surgeon: Theodoro Kos, DO;  Location: [facility];  Service: Plastics;  Laterality: Bilateral;  . MODIFIED RADICAL MASTECTOMY Right 03/11/2010  . NASAL SEPTUM SURGERY    . PORT-A-CATH REMOVAL Left 12/01/2010  . PORTACATH PLACEMENT Left 09/12/2009  . REMOVAL OF TISSUE EXPANDER AND PLACEMENT  OF IMPLANT Right 08/23/2013   Procedure: REMOVAL RIGHT TISSUE EXPANDER AND PLACEMENT OF IMPLANT TO RIGHT BREAST ;  Surgeon: Theodoro Kos, DO;  Location: [facility];  Service: Plastics;  Laterality: Right;  . SUPRA-UMBILICAL HERNIA  3419  . TUBAL LIGATION  11/21/2008    There were no vitals filed for this visit.      Subjective Assessment - 03/17/16 1529    Subjective The herring bone bandages on my arm really worked well last time. My arm had come down alot.    Pertinent History Right breast cancer with mastectomy 2011; reconstruction; left breast reduction; h/o right UE lymphedema and right upper quadrant pain, treated in this clinic several times before.   Patient Stated Goals to get swelling under control   Currently in Pain? Yes   Pain Score --  unable to rate today   Pain Location Shoulder   Pain Orientation Right   Pain Descriptors / Indicators Aching;Constant   Pain Type Chronic pain   Aggravating Factors  reaching too far   Pain Relieving Factors stretching                         [facility] Adult PT Treatment/Exercise - 03/17/16 0001      Manual Therapy   Manual Lymphatic Drainage (MLD) In Supine: Short neck, superficial and deep abdominals, Rt inguinal and Lt axillary nodes, Rt axillo-inguinal and  anterior inter-axillary anastomosis, then Rt UE from lateral shoulder to dorsal hand working proximal to distal then retracing all steps.   Compression Bandaging Lotion applied, thick stockinette, did not wrap finger today , Artiflex x2, 1-6, 1-10 and 3-12 cm short stretch compression bandages from hand to axilla (applied 12 cm bandages in herring bone starting at upper arm with first 12-cm. bandage and then from wrist to shoulder with 2nd and 3rd 12 cm. bandages).                   Short Term Clinic Goals - 03/10/16 1306      CC Short Term Goal  #1   Title Decrease circumference measurement at 10 cm proximal to olecranon on the right to  46.6 cm or less.   Status Achieved     CC Short Term Goal  #2   Title Decrease circumference measurement at right olecranon to 31.3 cm or less   Status Partially [facility] Goals - 03/01/16 1050      CC Long Term Goal  #1   Title Patient will achieve right shoulder flexion and abduction AROM of at least 110 degrees to improve performance of ADLs.   Baseline flexion 88 degrees, abduction 67 degrees at eval   Time 6   Period Weeks   Status New     CC Long Term Goal  #2   Title Patient will know how to obtain new compression garments to improve management of lymphedema at home.   Time 6   Period Weeks   Status New     CC Long Term Goal  #3   Title Reduce Lymphedema Life Impact Scale to less than 50 percent impairment.   Baseline 82% impairment at eval   Time 6   Period Weeks   Status New            Plan - 03/17/16 1613    Clinical Impression Statement Did the herring bone pattern with all 3-12 cm bandages again as pt reported this has given her great reductions when she removes her bandages. Did not measure today as pt has been out of bandages since the weekend.    Rehab Potential Good   Clinical Impairments Affecting Rehab Potential chronic pain issues   PT Frequency 3x / week   PT Duration 6 weeks   PT Treatment/Interventions ADLs/Self Care Home Management;Electrical Stimulation;Therapeutic exercise;DME Instruction;Patient/family education;Manual techniques;Manual lymph drainage;Compression bandaging;Passive range of motion;Visual/perceptual remediation/compensation;Taping   PT Next Visit Plan Remeasure next for goal assess. cont manual lymph drainage and compression bandaging of right UE and later begin ROM of Rt UE; assess nighttime garment for appropriate fit when patient brings this in check to see if pt has contacted Medicaid    Consulted and Agree with Plan of Care Patient      Patient will benefit from skilled therapeutic intervention  in order to improve the following deficits and impairments:     Visit Diagnosis: Postmastectomy lymphedema     Problem List Patient Active Problem List   Diagnosis Date Noted  . Essential hypertension 11/05/2015  . Cervical dystonia 08/25/2015  . Headache 07/09/2015  . Myofacial muscle pain 04/29/2015  . Adhesive capsulitis of right shoulder 04/29/2015  . Type 2 diabetes mellitus without complication ([place]) 69/45/0388  . Lower extremity edema 10/01/2014  . Cancer of overlapping sites of right female breast ([place]) 05/24/2014  . Myalgia and myositis 05/17/2014  .  Neoplasm related pain 03/12/2014  . CN (constipation) 11/26/2013  . H/O reduction mammoplasty 10/30/2013  . Seizure ([place]) 10/06/2013  . Status post bilateral breast implants 08/31/2013  . Acquired absence of breast and absent nipple 08/23/2013  . S/P breast reconstruction, right 08/23/2013  . AC joint arthropathy 07/02/2013  . Routine adult health maintenance 05/29/2013  . Hypokalemia 05/21/2013  . Chronic pain 04/19/2013  . Elevated blood sugar 03/09/2013  . Wheezing 02/22/2013  . Acute bronchitis 02/22/2013  . Lymphedema 05/24/2011  . RHINOSINUSITIS, CHRONIC 04/08/2010  . Obesity 08/04/2006  . DEPRESSIVE DISORDER, NOS 08/04/2006  . RHINITIS, ALLERGIC 08/04/2006    Otelia Limes, PTA 03/17/2016, 4:16 PM  [place], [place], [address] Phone: [phone]   Fax:  [phone]  Name: Karen Hopkins MRN: [id number] Date of Birth: 1975-06-18

## 2016-03-19 ENCOUNTER — Ambulatory Visit: Payer: Medicare Other | Admitting: Physical Therapy

## 2016-03-19 ENCOUNTER — Ambulatory Visit (HOSPITAL_BASED_OUTPATIENT_CLINIC_OR_DEPARTMENT_OTHER): Payer: Medicare Other | Attending: Cardiovascular Disease | Admitting: Cardiovascular Disease

## 2016-03-19 VITALS — Ht 64.0 in | Wt 242.0 lb

## 2016-03-19 DIAGNOSIS — R0683 Snoring: Secondary | ICD-10-CM | POA: Diagnosis present

## 2016-03-19 DIAGNOSIS — I972 Postmastectomy lymphedema syndrome: Secondary | ICD-10-CM

## 2016-03-19 DIAGNOSIS — G4719 Other hypersomnia: Secondary | ICD-10-CM | POA: Insufficient documentation

## 2016-03-19 DIAGNOSIS — Z79899 Other long term (current) drug therapy: Secondary | ICD-10-CM | POA: Insufficient documentation

## 2016-03-19 DIAGNOSIS — G478 Other sleep disorders: Secondary | ICD-10-CM | POA: Diagnosis not present

## 2016-03-19 DIAGNOSIS — Z7984 Long term (current) use of oral hypoglycemic drugs: Secondary | ICD-10-CM | POA: Insufficient documentation
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
## 2016-03-22 ENCOUNTER — Other Ambulatory Visit: Payer: Self-pay | Admitting: *Deleted

## 2016-03-22 ENCOUNTER — Ambulatory Visit: Payer: Medicare Other

## 2016-03-22 ENCOUNTER — Telehealth: Payer: Self-pay | Admitting: *Deleted

## 2016-03-22 DIAGNOSIS — I972 Postmastectomy lymphedema syndrome: Secondary | ICD-10-CM | POA: Diagnosis not present

## 2016-03-22 DIAGNOSIS — C50811 Malignant neoplasm of overlapping sites of right female breast: Secondary | ICD-10-CM

## 2016-03-22 NOTE — Therapy (Signed)
[place], [place], [address] Phone: [phone]   Fax:  [phone]  Physical Therapy Treatment  Patient Details  Name: Karen Hopkins MRN: [id number] Date of Birth: [birth year] Referring Provider: Danella Sensing, NP  Encounter Date: 03/22/2016      PT End of Session - 03/22/16 1127    Visit Number 9   Number of Visits 19   Date for PT Re-Evaluation 04/16/16   PT Start Time 1105   PT Stop Time 1149   PT Time Calculation (min) 44 min   Activity Tolerance Patient tolerated treatment well   Behavior During Therapy [facility] for tasks assessed/performed      Past Medical History:  Diagnosis Date  . Anxiety   . Cancer ([place])   . Depression   . Essential hypertension 11/05/2015  . History of breast cancer 2011   right  . History of chemotherapy 2011  . History of radiation therapy 2011  . Hypertension    under control with med., has been on med. x 2 yr.  . Lymphedema of arm    right; no BP or puncture to right arm  . Seasonal allergies   . Sinus headache     Past Surgical History:  Procedure Laterality Date  . BREAST REDUCTION SURGERY Left 08/23/2013   Procedure: LEFT BREAST REDUCTION  ;  Surgeon: Theodoro Kos, DO;  Location: [place];  Service: Plastics;  Laterality: Left;  . CESAREAN SECTION  07/15/2001; 03/18/2004; 11/21/2008  . LATISSIMUS FLAP TO BREAST Right 03/12/2013   Procedure: RIGHT BREAST LATISSIMUS FLAP WITH EXPANDER PLACEMENT;  Surgeon: Theodoro Kos, DO;  Location: [place];  Service: Plastics;  Laterality: Right;  . LIPOSUCTION Bilateral 08/23/2013   Procedure: LIPOSUCTION;  Surgeon: Theodoro Kos, DO;  Location: [place];  Service: Plastics;  Laterality: Bilateral;  . MODIFIED RADICAL MASTECTOMY Right 03/11/2010  . NASAL SEPTUM SURGERY    . PORT-A-CATH REMOVAL Left 12/01/2010  . PORTACATH PLACEMENT Left 09/12/2009  . REMOVAL OF TISSUE EXPANDER AND PLACEMENT  OF IMPLANT Right 08/23/2013   Procedure: REMOVAL RIGHT TISSUE EXPANDER AND PLACEMENT OF IMPLANT TO RIGHT BREAST ;  Surgeon: Theodoro Kos, DO;  Location: [place];  Service: Plastics;  Laterality: Right;  . SUPRA-UMBILICAL HERNIA  [id number]  . TUBAL LIGATION  11/21/2008    There were no vitals filed for this visit.      Subjective Assessment - 03/22/16 1109    Subjective My hand and forearm unravelled Saturday morning so I had to take off the bandage. I called [name] about getting measured for my new compression garment. She was out of the office so I was told she was going to return my call today.    Pertinent History Right breast cancer with mastectomy 2011; reconstruction; left breast reduction; h/o right UE lymphedema and right upper quadrant pain, treated in this clinic several times before.   Patient Stated Goals to get swelling under control   Currently in Pain? Yes   Pain Score 6    Pain Location Arm   Pain Descriptors / Indicators Other (Comment)  same as it always is   Pain Type Chronic pain   Pain Onset More than a month ago   Pain Frequency Constant   Aggravating Factors  it just always hurts   Pain Relieving Factors stretching  [facility] Adult PT Treatment/Exercise - 03/22/16 0001      Manual Therapy   Manual Lymphatic Drainage (MLD) In Supine: Short neck, superficial and deep abdominals, Rt inguinal and Lt axillary nodes, Rt axillo-inguinal and anterior inter-axillary anastomosis, then Rt UE from lateral shoulder to dorsal hand working proximal to distal then retracing all steps.   Compression Bandaging Lotion applied, thick stockinette, did not wrap any fingers today , Artiflex x2, 1-6, 1-10 and 3-12 cm short stretch compression bandages from hand to axilla (applied 12 cm bandages in herring bone starting at upper arm with first 12-cm. bandage and then from wrist to shoulder with 2nd and 3rd 12 cm. bandages).                    Short Term Clinic Goals - 03/19/16 1207      CC Short Term Goal  #2   Title Decrease circumference measurement at right olecranon to 31.3 cm or less   Baseline 32 cm. on right compared to 30.7 on left; 31.3 on 03/19/16   Status Achieved             Long Term Clinic Goals - 03/19/16 1208      CC Long Term Goal  #1   Title Patient will achieve right shoulder flexion and abduction AROM of at least 110 degrees to improve performance of ADLs.   Status On-going     CC Long Term Goal  #2   Title Patient will know how to obtain new compression garments to improve management of lymphedema at home.   Status Partially Met     CC Long Term Goal  #3   Title Reduce Lymphedema Life Impact Scale to less than 50 percent impairment.   Status On-going            Plan - 03/22/16 1129    Clinical Impression Statement Pt is waiting to hear back from [facility] to get measured for new compression garments. She reports her bandages started sliding off Saturday morning so she had to take off her bandages at that time.    Rehab Potential Good   Clinical Impairments Affecting Rehab Potential chronic pain issues   PT Frequency 3x / week   PT Duration 6 weeks   PT Treatment/Interventions ADLs/Self Care Home Management;Electrical Stimulation;Therapeutic exercise;DME Instruction;Patient/family education;Manual techniques;Manual lymph drainage;Compression bandaging;Passive range of motion;Visual/perceptual remediation/compensation;Taping   PT Next Visit Plan Remeasure circumference. Cont manual lymph drainage and compression bandaging of right UE and later begin ROM of Rt UE; assess nighttime garment for appropriate fit when patient brings this in check    Consulted and Agree with Plan of Care Patient      Patient will benefit from skilled therapeutic intervention in order to improve the following deficits and impairments:  Decreased range of motion, Decreased strength,  Increased edema, Impaired UE functional use, Pain  Visit Diagnosis: Postmastectomy lymphedema     Problem List Patient Active Problem List   Diagnosis Date Noted  . Essential hypertension 11/05/2015  . Cervical dystonia 08/25/2015  . Headache 07/09/2015  . Myofacial muscle pain 04/29/2015  . Adhesive capsulitis of right shoulder 04/29/2015  . Type 2 diabetes mellitus without complication ([place]) 18/84/1660  . Lower extremity edema 10/01/2014  . Cancer of overlapping sites of right female breast ([place]) 05/24/2014  . Myalgia and myositis 05/17/2014  . Neoplasm related pain 03/12/2014  . CN (constipation) 11/26/2013  . H/O reduction mammoplasty 10/30/2013  . Seizure ([place]) 10/06/2013  . Status  post bilateral breast implants 08/31/2013  . Acquired absence of breast and absent nipple 08/23/2013  . S/P breast reconstruction, right 08/23/2013  . AC joint arthropathy 07/02/2013  . Routine adult health maintenance 05/29/2013  . Hypokalemia 05/21/2013  . Chronic pain 04/19/2013  . Elevated blood sugar 03/09/2013  . Wheezing 02/22/2013  . Acute bronchitis 02/22/2013  . Lymphedema 05/24/2011  . RHINOSINUSITIS, CHRONIC 04/08/2010  . Obesity 08/04/2006  . DEPRESSIVE DISORDER, NOS 08/04/2006  . RHINITIS, ALLERGIC 08/04/2006    Otelia Limes, PTA 03/22/2016, 11:55 AM  [place], [place], [address] Phone: [phone]   Fax:  [phone]  Name: Karen Hopkins MRN: [id number] Date of Birth: December 28, 1975

## 2016-03-23 ENCOUNTER — Ambulatory Visit (HOSPITAL_BASED_OUTPATIENT_CLINIC_OR_DEPARTMENT_OTHER): Payer: Medicare Other

## 2016-03-23 ENCOUNTER — Telehealth: Payer: Self-pay | Admitting: *Deleted

## 2016-03-23 ENCOUNTER — Other Ambulatory Visit (HOSPITAL_BASED_OUTPATIENT_CLINIC_OR_DEPARTMENT_OTHER): Payer: Medicare Other

## 2016-03-23 VITALS — BP 132/91 | HR 88 | Resp 18

## 2016-03-23 DIAGNOSIS — C50911 Malignant neoplasm of unspecified site of right female breast: Secondary | ICD-10-CM

## 2016-03-23 DIAGNOSIS — Z5111 Encounter for antineoplastic chemotherapy: Secondary | ICD-10-CM | POA: Diagnosis present

## 2016-03-23 DIAGNOSIS — C50811 Malignant neoplasm of overlapping sites of right female breast: Secondary | ICD-10-CM

## 2016-03-23 LAB — CBC WITH DIFFERENTIAL/PLATELET
BASO%: 0.2 % (ref 0.0–2.0)
BASOS ABS: 0 10*3/uL (ref 0.0–0.1)
EOS ABS: 0.5 10*3/uL (ref 0.0–0.5)
EOS%: 5.4 % (ref 0.0–7.0)
HEMATOCRIT: 38.6 % (ref 34.8–46.6)
HEMOGLOBIN: 12.9 g/dL (ref 11.6–15.9)
LYMPH#: 3.3 10*3/uL (ref 0.9–3.3)
LYMPH%: 39.8 % (ref 14.0–49.7)
MCH: 27.7 pg (ref 25.1–34.0)
MCHC: 33.4 g/dL (ref 31.5–36.0)
MCV: 82.8 fL (ref 79.5–101.0)
MONO#: 0.4 10*3/uL (ref 0.1–0.9)
MONO%: 5.3 % (ref 0.0–14.0)
NEUT#: 4.1 10*3/uL (ref 1.5–6.5)
NEUT%: 49.3 % (ref 38.4–76.8)
PLATELETS: 311 10*3/uL (ref 145–400)
RBC: 4.66 10*6/uL (ref 3.70–5.45)
RDW: 14.8 % — AB (ref 11.2–14.5)
WBC: 8.4 10*3/uL (ref 3.9–10.3)

## 2016-03-23 LAB — COMPREHENSIVE METABOLIC PANEL
ALBUMIN: 3.7 g/dL (ref 3.5–5.0)
ALK PHOS: 109 U/L (ref 40–150)
ALT: 14 U/L (ref 0–55)
ANION GAP: 11 meq/L (ref 3–11)
AST: 12 U/L (ref 5–34)
BILIRUBIN TOTAL: 0.26 mg/dL (ref 0.20–1.20)
BUN: 9 mg/dL (ref 7.0–26.0)
CALCIUM: 10.3 mg/dL (ref 8.4–10.4)
CO2: 25 mEq/L (ref 22–29)
Chloride: 107 mEq/L (ref 98–109)
Creatinine: 0.8 mg/dL (ref 0.6–1.1)
GLUCOSE: 108 mg/dL (ref 70–140)
POTASSIUM: 2.9 meq/L — AB (ref 3.5–5.1)
Sodium: 142 mEq/L (ref 136–145)
TOTAL PROTEIN: 8.2 g/dL (ref 6.4–8.3)

## 2016-03-23 MED ORDER — SULFAMETHOXAZOLE-TRIMETHOPRIM 800-160 MG PO TABS: 1.0000 | ORAL_TABLET | Freq: Two times a day (BID) | ORAL | 0 refills | Status: DC

## 2016-03-23 MED ORDER — GOSERELIN ACETATE 3.6 MG ~~LOC~~ IMPL
3.6000 mg | DRUG_IMPLANT | Freq: Once | SUBCUTANEOUS | Status: AC
  Administered 2016-03-23: 3.6 mg via SUBCUTANEOUS
  Filled 2016-03-23: qty 3.6

## 2016-03-23 NOTE — Patient Instructions (Signed)
Goserelin injection What is this medicine? GOSERELIN (GOE se rel in) is similar to a hormone found in the body. It lowers the amount of sex hormones that the body makes. Men will have lower testosterone levels and women will have lower estrogen levels while taking this medicine. In men, this medicine is used to treat prostate cancer; the injection is either given once per month or once every 12 weeks. A once per month injection (only) is used to treat women with endometriosis, dysfunctional uterine bleeding, or advanced breast cancer. This medicine may be used for other purposes; ask your health care provider or pharmacist if you have questions. What should I tell my health care provider before I take this medicine? They need to know if you have any of these conditions (some only apply to women): -diabetes -heart disease or previous heart attack -high blood pressure -high cholesterol -kidney disease -osteoporosis or low bone density -problems passing urine -spinal cord injury -stroke -tobacco smoker -an unusual or allergic reaction to goserelin, hormone therapy, other medicines, foods, dyes, or preservatives -pregnant or trying to get pregnant -breast-feeding How should I use this medicine? This medicine is for injection under the skin. It is given by a health care professional in a hospital or clinic setting. Men receive this injection once every 4 weeks or once every 12 weeks. Women will only receive the once every 4 weeks injection. Talk to your pediatrician regarding the use of this medicine in children. Special care may be needed. Overdosage: If you think you have taken too much of this medicine contact a poison control center or emergency room at once. NOTE: This medicine is only for you. Do not share this medicine with others. What if I miss a dose? It is important not to miss your dose. Call your doctor or health care professional if you are unable to keep an appointment. What may  interact with this medicine? -female hormones like estrogen -herbal or dietary supplements like black cohosh, chasteberry, or DHEA -female hormones like testosterone -prasterone This list may not describe all possible interactions. Give your health care provider a list of all the medicines, herbs, non-prescription drugs, or dietary supplements you use. Also tell them if you smoke, drink alcohol, or use illegal drugs. Some items may interact with your medicine. What should I watch for while using this medicine? Visit your doctor or health care professional for regular checks on your progress. Your symptoms may appear to get worse during the first weeks of this therapy. Tell your doctor or healthcare professional if your symptoms do not start to get better or if they get worse after this time. Your bones may get weaker if you take this medicine for a long time. If you smoke or frequently drink alcohol you may increase your risk of bone loss. A family history of osteoporosis, chronic use of drugs for seizures (convulsions), or corticosteroids can also increase your risk of bone loss. Talk to your doctor about how to keep your bones strong. This medicine should stop regular monthly menstration in women. Tell your doctor if you continue to menstrate. Women should not become pregnant while taking this medicine or for 12 weeks after stopping this medicine. Women should inform their doctor if they wish to become pregnant or think they might be pregnant. There is a potential for serious side effects to an unborn child. Talk to your health care professional or pharmacist for more information. Do not breast-feed an infant while taking this medicine. Men should   inform their doctors if they wish to father a child. This medicine may lower sperm counts. Talk to your health care professional or pharmacist for more information. What side effects may I notice from receiving this medicine? Side effects that you should  report to your doctor or health care professional as soon as possible: -allergic reactions like skin rash, itching or hives, swelling of the face, lips, or tongue -bone pain -breathing problems -changes in vision -chest pain -feeling faint or lightheaded, falls -fever, chills -pain, swelling, warmth in the leg -pain, tingling, numbness in the hands or feet -signs and symptoms of low blood pressure like dizziness; feeling faint or lightheaded, falls; unusually weak or tired -stomach pain -swelling of the ankles, feet, hands -trouble passing urine or change in the amount of urine -unusually high or low blood pressure -unusually weak or tired Side effects that usually do not require medical attention (report to your doctor or health care professional if they continue or are bothersome): -change in sex drive or performance -changes in breast size in both males and females -changes in emotions or moods -headache -hot flashes -irritation at site where injected -loss of appetite -skin problems like acne, dry skin -vaginal dryness This list may not describe all possible side effects. Call your doctor for medical advice about side effects. You may report side effects to FDA at 1-800-FDA-1088. Where should I keep my medicine? This drug is given in a hospital or clinic and will not be stored at home. NOTE: This sheet is a summary. It may not cover all possible information. If you have questions about this medicine, talk to your doctor, pharmacist, or health care provider.    2016, Elsevier/Gold Standard. (2013-07-31 11:10:35)  

## 2016-03-23 NOTE — Telephone Encounter (Signed)
This RN spoke with pt per noted potassium of 2.9 with current use of lasix and Klor con 20 mEq.  Karen Hopkins states she has not been taking the Klor Con due to " I got a toothache and just have not been taking my meds regularly "  Discussed above - Karen Hopkins is scheduled to see a dentist this Thursday.  Reviewed appropriate OTC medications for benefit of discomfort as well as Dr Jannifer Rodney will order an ABX per her request.  Reviewed current prescription of klor con with pt understanding need to take 2 dose of the 20 mEq today and tomorrow and then resume one tablet a day.  Karen Hopkins verbalized understanding of dose and concerns relating to heart arrhythmias which could be serious.  Karen Hopkins will call if needed.

## 2016-03-24 ENCOUNTER — Ambulatory Visit: Payer: Medicare Other

## 2016-03-24 DIAGNOSIS — I972 Postmastectomy lymphedema syndrome: Secondary | ICD-10-CM | POA: Diagnosis not present

## 2016-03-24 NOTE — Therapy (Signed)
[place], [place], [address] Phone: [phone]   Fax:  [phone]  Physical Therapy Treatment  Patient Details  Name: Karen Hopkins MRN: [id number] Date of Birth: [birth year] Referring Provider: Danella Sensing, NP  Encounter Date: 03/24/2016      PT End of Session - 03/24/16 1159    Visit Number 10   Number of Visits 19   Date for PT Re-Evaluation 04/16/16   PT Start Time 1107   PT Stop Time 6629   PT Time Calculation (min) 49 min      Past Medical History:  Diagnosis Date  . Anxiety   . Cancer ([place])   . Depression   . Essential hypertension 11/05/2015  . History of breast cancer 2011   right  . History of chemotherapy 2011  . History of radiation therapy 2011  . Hypertension    under control with med., has been on med. x 2 yr.  . Lymphedema of arm    right; no BP or puncture to right arm  . Seasonal allergies   . Sinus headache     Past Surgical History:  Procedure Laterality Date  . BREAST REDUCTION SURGERY Left 08/23/2013   Procedure: LEFT BREAST REDUCTION  ;  Surgeon: Theodoro Kos, DO;  Location: [place];  Service: Plastics;  Laterality: Left;  . CESAREAN SECTION  07/15/2001; 03/18/2004; 11/21/2008  . LATISSIMUS FLAP TO BREAST Right 03/12/2013   Procedure: RIGHT BREAST LATISSIMUS FLAP WITH EXPANDER PLACEMENT;  Surgeon: Theodoro Kos, DO;  Location: [place];  Service: Plastics;  Laterality: Right;  . LIPOSUCTION Bilateral 08/23/2013   Procedure: LIPOSUCTION;  Surgeon: Theodoro Kos, DO;  Location: [place];  Service: Plastics;  Laterality: Bilateral;  . MODIFIED RADICAL MASTECTOMY Right 03/11/2010  . NASAL SEPTUM SURGERY    . PORT-A-CATH REMOVAL Left 12/01/2010  . PORTACATH PLACEMENT Left 09/12/2009  . REMOVAL OF TISSUE EXPANDER AND PLACEMENT OF IMPLANT Right 08/23/2013   Procedure: REMOVAL RIGHT TISSUE EXPANDER AND PLACEMENT OF IMPLANT TO RIGHT BREAST ;   Surgeon: Theodoro Kos, DO;  Location: [place];  Service: Plastics;  Laterality: Right;  . SUPRA-UMBILICAL HERNIA  [id number]  . TUBAL LIGATION  11/21/2008    There were no vitals filed for this visit.      Subjective Assessment - 03/24/16 1111    Subjective I have a really bad toothache. It got bad after I was here Monday and I've hardly been able to eat. The doctor called me in a prescription for antibiotics but I haven't picked it up yet. Have an appt with my dentist tomorrow.    Pertinent History Right breast cancer with mastectomy 2011; reconstruction; left breast reduction; h/o right UE lymphedema and right upper quadrant pain, treated in this clinic several times before.   Patient Stated Goals to get swelling under control   Currently in Pain? Yes   Pain Score 9    Pain Location Teeth   Pain Orientation Left   Pain Type Acute pain   Pain Onset In the past 7 days   Pain Frequency Constant   Aggravating Factors  Just started hurting Monday, can feel a whole in my tooth   Pain Relieving Factors my pain meds            [facility] PT Assessment - 03/24/16 0001      Observation/Other Assessments   Other Surveys  --  LLIS: 63% Impairement  LYMPHEDEMA/ONCOLOGY QUESTIONNAIRE - 03/24/16 1113      Right Upper Extremity Lymphedema   15 cm Proximal to Olecranon Process 44.7 cm   10 cm Proximal to Olecranon Process 44.4 cm   Olecranon Process 30.6 cm   15 cm Proximal to Ulnar Styloid Process 29.5 cm   10 cm Proximal to Ulnar Styloid Process 25.4 cm   Just Proximal to Ulnar Styloid Process 18 cm   Across Hand at [employer] 19.4 cm   At [place] of 2nd Digit 6 cm                  [facility] Adult PT Treatment/Exercise - 03/24/16 0001      Manual Therapy   Manual Lymphatic Drainage (MLD) In Supine: Short neck, superficial and 5 diaphragmatic breaths only due to increased tooth pain), Rt inguinal and Lt axillary nodes, Rt axillo-inguinal and anterior  inter-axillary anastomosis, then Rt UE from lateral shoulder to dorsal hand working proximal to distal then retracing all steps.   Compression Bandaging Lotion applied, thick stockinette, did not wrap any fingers today , Artiflex x2, 1-6, 1-10 and 3-12 cm short stretch compression bandages from hand to axilla (applied 12 cm bandages in herring bone starting at upper arm with first 12-cm. bandage and then from wrist to shoulder with 2nd and 3rd 12 cm. bandages).                   Short Term Clinic Goals - 03/24/16 1207      CC Short Term Goal  #1   Title Decrease circumference measurement at 10 cm proximal to olecranon on the right to 46.6 cm or less.   Baseline 48.2 on right at eval compared to 44.9 on left; 44.4 cm 03/24/16   Status Achieved     CC Short Term Goal  #2   Title Decrease circumference measurement at right olecranon to 31.3 cm or less   Baseline 32 cm. on right compared to 30.7 on left; 31.3 on 03/19/16; 30.6 cm 03/24/16   Status Achieved             Long Term Clinic Goals - 03/24/16 1209      CC Long Term Goal  #1   Title Patient will achieve right shoulder flexion and abduction AROM of at least 110 degrees to improve performance of ADLs.   Baseline flexion 88 degrees, abduction 67 degrees at eval; Pt able to reach up to back of head to adjust her hair after session today without pain so flexion and abduction < 100 degrees today   Status Partially Met     CC Long Term Goal  #2   Title Patient will know how to obtain new compression garments to improve management of lymphedema at home.   Status Partially Met     CC Long Term Goal  #3   Title Reduce Lymphedema Life Impact Scale to less than 50 percent impairment.   Baseline 82% impairment at eval; 63% impairement on 03/24/16   Status Partially Met            Plan - 03/24/16 1200    Clinical Impression Statement Gcode done today. Pt hasn't heard back from [facility] yet but didn't call either as  she has been dealing with a really bad toothache that hurts more when she has to talk much. She has an appt with her dentist tomorrow to address this. Her cicumference measurements continue to improve since start of this episode and  her upper most arm measurements was reduced by 0.9 cm from last week. Pt is pleased with this progress thus far.    Rehab Potential Good   Clinical Impairments Affecting Rehab Potential chronic pain issues   PT Frequency 3x / week   PT Duration 6 weeks   PT Treatment/Interventions ADLs/Self Care Home Management;Electrical Stimulation;Therapeutic exercise;DME Instruction;Patient/family education;Manual techniques;Manual lymph drainage;Compression bandaging;Passive range of motion;Visual/perceptual remediation/compensation;Taping   PT Next Visit Plan Cont manual lymph drainage and compression bandaging of right UE and later begin ROM of Rt UE; assess nighttime garment for appropriate fit when patient brings this in check    Consulted and Agree with Plan of Care Patient      Patient will benefit from skilled therapeutic intervention in order to improve the following deficits and impairments:  Decreased range of motion, Decreased strength, Increased edema, Impaired UE functional use, Pain  Visit Diagnosis: Postmastectomy lymphedema     Problem List Patient Active Problem List   Diagnosis Date Noted  . Essential hypertension 11/05/2015  . Cervical dystonia 08/25/2015  . Headache 07/09/2015  . Myofacial muscle pain 04/29/2015  . Adhesive capsulitis of right shoulder 04/29/2015  . Type 2 diabetes mellitus without complication ([place]) 89/16/9450  . Lower extremity edema 10/01/2014  . Cancer of overlapping sites of right female breast ([place]) 05/24/2014  . Myalgia and myositis 05/17/2014  . Neoplasm related pain 03/12/2014  . CN (constipation) 11/26/2013  . H/O reduction mammoplasty 10/30/2013  . Seizure ([place]) 10/06/2013  . Status post bilateral breast implants  08/31/2013  . Acquired absence of breast and absent nipple 08/23/2013  . S/P breast reconstruction, right 08/23/2013  . AC joint arthropathy 07/02/2013  . Routine adult health maintenance 05/29/2013  . Hypokalemia 05/21/2013  . Chronic pain 04/19/2013  . Elevated blood sugar 03/09/2013  . Wheezing 02/22/2013  . Acute bronchitis 02/22/2013  . Lymphedema 05/24/2011  . RHINOSINUSITIS, CHRONIC 04/08/2010  . Obesity 08/04/2006  . DEPRESSIVE DISORDER, NOS 08/04/2006  . RHINITIS, ALLERGIC 08/04/2006    Otelia Limes, PTA 03/24/2016, 12:18 PM  [place], [place], [address] Phone: [phone]   Fax:  [phone]  Name: Karen Hopkins MRN: [id number] Date of Birth: 02-09-1976  Serafina Royals, PT 03/24/16 12:31 PM

## 2016-03-26 ENCOUNTER — Ambulatory Visit: Payer: Medicare Other | Admitting: Physical Therapy

## 2016-03-26 DIAGNOSIS — I972 Postmastectomy lymphedema syndrome: Secondary | ICD-10-CM

## 2016-03-26 NOTE — Therapy (Signed)
[place], [place], [address] Phone: [phone]   Fax:  [phone]  Physical Therapy Treatment  Patient Details  Name: Karen Hopkins MRN: [id number] Date of Birth: 07/27/1975 Referring Provider: Danella Sensing, NP  Encounter Date: 03/26/2016      PT End of Session - 03/26/16 1156    Visit Number 11   Number of Visits 19   Date for PT Re-Evaluation 04/16/16   PT Start Time 1109   PT Stop Time 1149   PT Time Calculation (min) 40 min   Activity Tolerance Patient limited by lethargy   Behavior During Therapy [facility] for tasks assessed/performed      Past Medical History:  Diagnosis Date  . Anxiety   . Cancer ([place])   . Depression   . Essential hypertension 11/05/2015  . History of breast cancer 2011   right  . History of chemotherapy 2011  . History of radiation therapy 2011  . Hypertension    under control with med., has been on med. x 2 yr.  . Lymphedema of arm    right; no BP or puncture to right arm  . Seasonal allergies   . Sinus headache     Past Surgical History:  Procedure Laterality Date  . BREAST REDUCTION SURGERY Left 08/23/2013   Procedure: LEFT BREAST REDUCTION  ;  Surgeon: Theodoro Kos, DO;  Location: [place];  Service: Plastics;  Laterality: Left;  . CESAREAN SECTION  07/15/2001; 03/18/2004; 11/21/2008  . LATISSIMUS FLAP TO BREAST Right 03/12/2013   Procedure: RIGHT BREAST LATISSIMUS FLAP WITH EXPANDER PLACEMENT;  Surgeon: Theodoro Kos, DO;  Location: [place];  Service: Plastics;  Laterality: Right;  . LIPOSUCTION Bilateral 08/23/2013   Procedure: LIPOSUCTION;  Surgeon: Theodoro Kos, DO;  Location: [place];  Service: Plastics;  Laterality: Bilateral;  . MODIFIED RADICAL MASTECTOMY Right 03/11/2010  . NASAL SEPTUM SURGERY    . PORT-A-CATH REMOVAL Left 12/01/2010  . PORTACATH PLACEMENT Left 09/12/2009  . REMOVAL OF TISSUE EXPANDER AND PLACEMENT OF  IMPLANT Right 08/23/2013   Procedure: REMOVAL RIGHT TISSUE EXPANDER AND PLACEMENT OF IMPLANT TO RIGHT BREAST ;  Surgeon: Theodoro Kos, DO;  Location: [place];  Service: Plastics;  Laterality: Right;  . SUPRA-UMBILICAL HERNIA  3825  . TUBAL LIGATION  11/21/2008    There were no vitals filed for this visit.      Subjective Assessment - 03/26/16 1110    Subjective Saw the dentist.  Toothache is some better, but has to have a tooth pulled and also wisdom teeth pulled, next Friday.  Kind of having anxiety today:  has to sing tonight at church and have boys at a fall festival at the same time.   Currently in Pain? Yes   Pain Score 6    Pain Location Teeth   Pain Orientation Left                         [facility] Adult PT Treatment/Exercise - 03/26/16 0001      Manual Therapy   Manual Lymphatic Drainage (MLD) In Supine: Short neck, superficial and 5 diaphragmatic breaths only due to increased tooth pain), Rt inguinal and Lt axillary nodes, Rt axillo-inguinal and anterior inter-axillary anastomosis, then Rt UE from lateral shoulder to dorsal hand working proximal to distal then retracing all steps.   Compression Bandaging Lotion applied, thick stockinette, did not wrap any fingers today , Artiflex x2,  1-6, 1-10 and 3-12 cm short stretch compression bandages from hand to axilla (applied 12 cm bandages in herring bone starting at upper arm with first 12-cm. bandage and then from wrist to shoulder with 2nd and 3rd 12 cm. bandages).                   Short Term Clinic Goals - 03/24/16 1207      CC Short Term Goal  #1   Title Decrease circumference measurement at 10 cm proximal to olecranon on the right to 46.6 cm or less.   Baseline 48.2 on right at eval compared to 44.9 on left; 44.4 cm 03/24/16   Status Achieved     CC Short Term Goal  #2   Title Decrease circumference measurement at right olecranon to 31.3 cm or less   Baseline 32 cm. on right  compared to 30.7 on left; 31.3 on 03/19/16; 30.6 cm 03/24/16   Status Achieved             Long Term Clinic Goals - 03/24/16 1209      CC Long Term Goal  #1   Title Patient will achieve right shoulder flexion and abduction AROM of at least 110 degrees to improve performance of ADLs.   Baseline flexion 88 degrees, abduction 67 degrees at eval; Pt able to reach up to back of head to adjust her hair after session today without pain so flexion and abduction < 100 degrees today   Status Partially Met     CC Long Term Goal  #2   Title Patient will know how to obtain new compression garments to improve management of lymphedema at home.   Status Partially Met     CC Long Term Goal  #3   Title Reduce Lymphedema Life Impact Scale to less than 50 percent impairment.   Baseline 82% impairment at eval; 63% impairement on 03/24/16   Status Partially Met            Plan - 03/26/16 1157    Clinical Impression Statement Still hasn't talked to [facility] and says she will try to call her again.  Took bandages off in the middle of the night due to discomfort; thinks it was how she slept on them.  Pt. anxious today, worrying about a busy schedule this weekend.   Rehab Potential Good   Clinical Impairments Affecting Rehab Potential chronic pain issues   PT Frequency 3x / week   PT Duration 6 weeks   PT Treatment/Interventions ADLs/Self Care Home Management;Electrical Stimulation;Therapeutic exercise;DME Instruction;Patient/family education;Manual techniques;Manual lymph drainage;Compression bandaging;Passive range of motion;Visual/perceptual remediation/compensation;Taping   PT Next Visit Plan Cont manual lymph drainage and compression bandaging of right UE and later begin ROM of Rt UE; assess nighttime garment for appropriate fit when patient brings this in check    Recommended Other Services will email [name] about garments   Consulted and Agree with Plan of Care Patient      Patient  will benefit from skilled therapeutic intervention in order to improve the following deficits and impairments:  Decreased range of motion, Decreased strength, Increased edema, Impaired UE functional use, Pain  Visit Diagnosis: Postmastectomy lymphedema     Problem List Patient Active Problem List   Diagnosis Date Noted  . Essential hypertension 11/05/2015  . Cervical dystonia 08/25/2015  . Headache 07/09/2015  . Myofacial muscle pain 04/29/2015  . Adhesive capsulitis of right shoulder 04/29/2015  . Type 2 diabetes mellitus without complication ([place]) 19/62/2297  .  Lower extremity edema 10/01/2014  . Cancer of overlapping sites of right female breast ([place]) 05/24/2014  . Myalgia and myositis 05/17/2014  . Neoplasm related pain 03/12/2014  . CN (constipation) 11/26/2013  . H/O reduction mammoplasty 10/30/2013  . Seizure ([place]) 10/06/2013  . Status post bilateral breast implants 08/31/2013  . Acquired absence of breast and absent nipple 08/23/2013  . S/P breast reconstruction, right 08/23/2013  . AC joint arthropathy 07/02/2013  . Routine adult health maintenance 05/29/2013  . Hypokalemia 05/21/2013  . Chronic pain 04/19/2013  . Elevated blood sugar 03/09/2013  . Wheezing 02/22/2013  . Acute bronchitis 02/22/2013  . Lymphedema 05/24/2011  . RHINOSINUSITIS, CHRONIC 04/08/2010  . Obesity 08/04/2006  . DEPRESSIVE DISORDER, NOS 08/04/2006  . RHINITIS, ALLERGIC 08/04/2006    Lakoda Mcanany 03/26/2016, 12:00 PM  [place], [place], [address] Phone: [phone]   Fax:  [phone]  Name: Karen Hopkins MRN: [id number] Date of Birth: 1975-10-02  Serafina Royals, PT 03/26/16 12:00 PM

## 2016-03-26 NOTE — Procedures (Signed)
Patient Name: Karen Hopkins, Karen Hopkins Date: 03/19/2016 Gender: Female D.O.B: 1976/04/25 Age (years): 40 Referring Provider: Skeet Latch Height (inches): 64 Interpreting Physician: Shelva Majestic MD, ABSM Weight (lbs): 242 RPSGT: Madelon Lips BMI: 42 MRN: [id number] Neck Size: 15.00  CLINICAL INFORMATION Sleep Study Type: NPSG Indication for sleep study: Snoring Epworth Sleepiness Score: 17  SLEEP STUDY TECHNIQUE As per the AASM Manual for the Scoring of Sleep and Associated Events v2.3 (April 2016) with a hypopnea requiring 4% desaturations. The channels recorded and monitored were frontal, central and occipital EEG, electrooculogram (EOG), submentalis EMG (chin), nasal and oral airflow, thoracic and abdominal wall motion, anterior tibialis EMG, snore microphone, electrocardiogram, and pulse oximetry.  MEDICATIONS ALPRAZolam (ALPRAZOLAM XR) 1 MG 24 hr tablet anastrozole (ARIMIDEX) 1 MG tablet carvedilol (COREG) 6.25 MG tablet citalopram (CELEXA) 20 MG tablet furosemide (LASIX) 40 MG tablet goserelin (ZOLADEX) 3.6 MG injection losartan (COZAAR) 50 MG tablet metFORMIN (GLUCOPHAGE) 500 MG tablet metoCLOPramide (REGLAN) 10 MG tablet naproxen (NAPROSYN) 500 MG tablet oxyCODONE (OXY IR/ROXICODONE) 5 MG immediate release tablet polyethylene glycol powder (GLYCOLAX) powder potassium chloride SA (K-DUR,KLOR-CON) 20 MEQ tablet sulfamethoxazole-trimethoprim (BACTRIM DS,SEPTRA DS) 800-160 MG tablet tiZANidine (ZANAFLEX) 4 MG tablet topiramate (TOPAMAX) 50 MG tablet  SLEEP ARCHITECTURE The study was initiated at 10:43:15 PM and ended at 4:58:09 AM. Sleep onset time was 0.5 minutes and the sleep efficiency was 89.1%. The total sleep time was 334.0 minutes. Wake after sleep onset (WASO) was 40.4 minbutes. Stage REM latency was 116.5 minutes. The patient spent 8.68% of the night in stage N1 sleep, 75.15% in stage N2 sleep, 0.00% in stage N3 and 16.17% in REM. Alpha intrusion  was absent. Supine sleep was 36.23%.  RESPIRATORY PARAMETERS  The overall apnea/hypopnea index (AHI) was 3.8 per hour. There were 1 total apneas, including 1 obstructive, 0 central and 0 mixed apneas. There were 20 hypopneas and 3 RERAs. The AHI during Stage REM sleep was 2.2 per hour. AHI while supine was 6.0 per hour. The mean oxygen saturation was 93.91%. The minimum SpO2 during sleep was 85.00%. Loud snoring was noted during this study.  CARDIAC DATA The 2 lead EKG demonstrated sinus rhythm. The mean heart rate was 67.30 beats per minute. Other EKG findings include: None. LEG MOVEMENT DATA The total PLMS were 0 with a resulting PLMS index of 0.00. Associated arousal with leg movement index was 0.0.  IMPRESSIONS - Probable Increased Upper Airway Resistance syndrome (UARS without significant obstructive sleep apnea  (AHI = 3.8/h; RDI 4.3)). - No significant central sleep apnea occurred during this study (CAI = 0.0/h). - Mild oxygen desaturation to a nadir of 85.00%. - The patient snored with Loud snoring volume. - No cardiac abnormalities were noted during this study. - Clinically significant periodic limb movements did not occur during sleep. No significant associated arousals.  DIAGNOSIS - Increased Upper Airway Resistance Syndrome (UARS) - Excessive Daytime Sleepiness  RECOMMENDATIONS - At present there is no indication for CPAP therapy. - Efforts should be made to optimize nasal and oropharyngeal patency. - Consider alternatives for the treatment of snoring. - If patient continues to have significant daytime sleepiness, consider a MLST test to further evaluate for idiopathic hypersomnolence or narcolepsy.  - Avoid alcohol, sedatives and other CNS depressants that may worsen sleep apnea and disrupt normal sleep architecture. - Sleep hygiene should be reviewed to assess factors that may improve sleep quality. - Weight management (BMI 42) and regular exercise should be initiated  or continued if appropriate.  [  Electronically signed] 03/26/2016 11:22 AM  Shelva Majestic MD, [facility], ABSM Diplomate, American Board of Sleep Medicine   NPI: [id number] [place] PH: [phone]   FX: [phone] [place]

## 2016-03-29 ENCOUNTER — Encounter: Payer: Medicare Other | Admitting: Physical Therapy

## 2016-03-31 ENCOUNTER — Ambulatory Visit: Payer: Medicare Other | Admitting: Physical Therapy

## 2016-03-31 DIAGNOSIS — M25511 Pain in right shoulder: Secondary | ICD-10-CM

## 2016-03-31 DIAGNOSIS — I972 Postmastectomy lymphedema syndrome: Secondary | ICD-10-CM

## 2016-03-31 DIAGNOSIS — G8929 Other chronic pain: Secondary | ICD-10-CM

## 2016-03-31 NOTE — Therapy (Signed)
[place], [place], [address] Phone: [phone]   Fax:  [phone]  Physical Therapy Treatment  Patient Details  Name: Karen Hopkins MRN: [id number] Date of Birth: 1976/02/05 Referring Provider: Danella Sensing, NP  Encounter Date: 03/31/2016      PT End of Session - 03/31/16 1151    Visit Number 12   Number of Visits 19   Date for PT Re-Evaluation 04/16/16   PT Start Time 1102   PT Stop Time 1147   PT Time Calculation (min) 45 min   Behavior During Therapy [facility] for tasks assessed/performed      Past Medical History:  Diagnosis Date  . Anxiety   . Cancer ([place])   . Depression   . Essential hypertension 11/05/2015  . History of breast cancer 2011   right  . History of chemotherapy 2011  . History of radiation therapy 2011  . Hypertension    under control with med., has been on med. x 2 yr.  . Lymphedema of arm    right; no BP or puncture to right arm  . Seasonal allergies   . Sinus headache     Past Surgical History:  Procedure Laterality Date  . BREAST REDUCTION SURGERY Left 08/23/2013   Procedure: LEFT BREAST REDUCTION  ;  Surgeon: Theodoro Kos, DO;  Location: [place];  Service: Plastics;  Laterality: Left;  . CESAREAN SECTION  07/15/2001; 03/18/2004; 11/21/2008  . LATISSIMUS FLAP TO BREAST Right 03/12/2013   Procedure: RIGHT BREAST LATISSIMUS FLAP WITH EXPANDER PLACEMENT;  Surgeon: Theodoro Kos, DO;  Location: [place];  Service: Plastics;  Laterality: Right;  . LIPOSUCTION Bilateral 08/23/2013   Procedure: LIPOSUCTION;  Surgeon: Theodoro Kos, DO;  Location: [place];  Service: Plastics;  Laterality: Bilateral;  . MODIFIED RADICAL MASTECTOMY Right 03/11/2010  . NASAL SEPTUM SURGERY    . PORT-A-CATH REMOVAL Left 12/01/2010  . PORTACATH PLACEMENT Left 09/12/2009  . REMOVAL OF TISSUE EXPANDER AND PLACEMENT OF IMPLANT Right 08/23/2013   Procedure: REMOVAL RIGHT  TISSUE EXPANDER AND PLACEMENT OF IMPLANT TO RIGHT BREAST ;  Surgeon: Theodoro Kos, DO;  Location: [place];  Service: Plastics;  Laterality: Right;  . SUPRA-UMBILICAL HERNIA  [id number]  . TUBAL LIGATION  11/21/2008    There were no vitals filed for this visit.      Subjective Assessment - 03/31/16 1104    Subjective Sorry about Monday.  I thought my appointment was at 11:00, not at 9:30. Bandages came off Saturday.   Currently in Pain? Yes   Pain Location --  whole body   Pain Type --  tired, out of it               LYMPHEDEMA/ONCOLOGY QUESTIONNAIRE - 03/31/16 1106      Right Upper Extremity Lymphedema   15 cm Proximal to Olecranon Process 45.3 cm   10 cm Proximal to Olecranon Process 45.3 cm   Olecranon Process 30.9 cm   15 cm Proximal to Ulnar Styloid Process 29.6 cm   10 cm Proximal to Ulnar Styloid Process 24.7 cm   Just Proximal to Ulnar Styloid Process 18 cm   Across Hand at [employer] 20.6 cm   At [place] of 2nd Digit 6.1 cm   Other had been out of the bandages for 4 days                  [facility] Adult PT Treatment/Exercise -  03/31/16 0001      Manual Therapy   Edema Management circumference measurement taken   Manual Lymphatic Drainage (MLD) In Supine: Short neck, superficial and deep abdominals, Rt inguinal and Lt axillary nodes, Rt axillo-inguinal and anterior inter-axillary anastomosis, then Rt UE from lateral shoulder to dorsal hand working proximal to distal then retracing all steps.   Compression Bandaging Lotion applied, thick stockinette, did not wrap any fingers today , Artiflex x2, 1-6, 1-10 and 3-12 cm short stretch compression bandages from hand to axilla (applied 12 cm bandages in herring bone starting at upper arm with first 12-cm. bandage and then from wrist to shoulder with 2nd and 3rd 12 cm. bandages).                   Short Term Clinic Goals - 03/24/16 1207      CC Short Term Goal  #1   Title Decrease  circumference measurement at 10 cm proximal to olecranon on the right to 46.6 cm or less.   Baseline 48.2 on right at eval compared to 44.9 on left; 44.4 cm 03/24/16   Status Achieved     CC Short Term Goal  #2   Title Decrease circumference measurement at right olecranon to 31.3 cm or less   Baseline 32 cm. on right compared to 30.7 on left; 31.3 on 03/19/16; 30.6 cm 03/24/16   Status Achieved             Long Term Clinic Goals - 03/31/16 1154      CC Long Term Goal  #1   Title Patient will achieve right shoulder flexion and abduction AROM of at least 110 degrees to improve performance of ADLs.   Status Partially Met     CC Long Term Goal  #2   Title Patient will know how to obtain new compression garments to improve management of lymphedema at home.   Status Partially Met     CC Long Term Goal  #3   Title Reduce Lymphedema Life Impact Scale to less than 50 percent impairment.   Status Partially Met     CC Long Term Goal  #4   Title right arm circumference at 15 cm provximal to olecranon will be reduced by 3 sm   Status Partially Met            Plan - 03/31/16 1152    Clinical Impression Statement Some ups and downs in circumference measurements today, but patient has been out of bandages for four days, having forgotten her appointment two days ago.  Therapist had an email exchange with [name] about measuring patient for new garments, and she plans to contact the patient.     Rehab Potential Good   Clinical Impairments Affecting Rehab Potential chronic pain issues   PT Frequency 3x / week   PT Duration 6 weeks   PT Treatment/Interventions ADLs/Self Care Home Management;Electrical Stimulation;Therapeutic exercise;DME Instruction;Patient/family education;Manual techniques;Manual lymph drainage;Compression bandaging;Passive range of motion;Visual/perceptual remediation/compensation;Taping   PT Next Visit Plan Cont manual lymph drainage and compression bandaging of  right UE and later begin ROM of Rt UE; assess nighttime garment for appropriate fit when patient brings this in check    Recommended Other Services [name] [employer] [employer] plans to contact the patient.   Consulted and Agree with Plan of Care Patient      Patient will benefit from skilled therapeutic intervention in order to improve the following deficits and impairments:  Decreased range of motion, Decreased  strength, Increased edema, Impaired UE functional use, Pain  Visit Diagnosis: Postmastectomy lymphedema  Chronic right shoulder pain     Problem List Patient Active Problem List   Diagnosis Date Noted  . Essential hypertension 11/05/2015  . Cervical dystonia 08/25/2015  . Headache 07/09/2015  . Myofacial muscle pain 04/29/2015  . Adhesive capsulitis of right shoulder 04/29/2015  . Type 2 diabetes mellitus without complication ([place]) 30/85/6943  . Lower extremity edema 10/01/2014  . Cancer of overlapping sites of right female breast ([place]) 05/24/2014  . Myalgia and myositis 05/17/2014  . Neoplasm related pain 03/12/2014  . CN (constipation) 11/26/2013  . H/O reduction mammoplasty 10/30/2013  . Seizure ([place]) 10/06/2013  . Status post bilateral breast implants 08/31/2013  . Acquired absence of breast and absent nipple 08/23/2013  . S/P breast reconstruction, right 08/23/2013  . AC joint arthropathy 07/02/2013  . Routine adult health maintenance 05/29/2013  . Hypokalemia 05/21/2013  . Chronic pain 04/19/2013  . Elevated blood sugar 03/09/2013  . Wheezing 02/22/2013  . Acute bronchitis 02/22/2013  . Lymphedema 05/24/2011  . RHINOSINUSITIS, CHRONIC 04/08/2010  . Obesity 08/04/2006  . DEPRESSIVE DISORDER, NOS 08/04/2006  . RHINITIS, ALLERGIC 08/04/2006    SALISBUR[place],DONNA 03/31/2016, 11:55 AM  [place], [place], [address] Phone: [phone]   Fax:  [phone]  Name: Karen Colmenares  Hopkins MRN: [id number] Date of Birth: 1975-06-24   Serafina Royals, PT 03/31/16 11:55 AM

## 2016-04-01 ENCOUNTER — Other Ambulatory Visit: Payer: Self-pay | Admitting: *Deleted

## 2016-04-01 MED ORDER — TIZANIDINE HCL 4 MG PO TABS: ORAL_TABLET | ORAL | 3 refills | Status: DC

## 2016-04-02 ENCOUNTER — Encounter: Payer: Self-pay | Admitting: Physical Medicine & Rehabilitation

## 2016-04-02 ENCOUNTER — Encounter: Payer: Medicare Other | Attending: Registered Nurse

## 2016-04-02 ENCOUNTER — Ambulatory Visit: Payer: Medicare Other | Admitting: Physical Therapy

## 2016-04-02 ENCOUNTER — Ambulatory Visit (HOSPITAL_BASED_OUTPATIENT_CLINIC_OR_DEPARTMENT_OTHER): Payer: Medicare Other | Admitting: Physical Medicine & Rehabilitation

## 2016-04-02 VITALS — BP 120/81 | HR 92 | Resp 14

## 2016-04-02 DIAGNOSIS — G243 Spasmodic torticollis: Secondary | ICD-10-CM | POA: Diagnosis not present

## 2016-04-02 DIAGNOSIS — M546 Pain in thoracic spine: Secondary | ICD-10-CM | POA: Insufficient documentation

## 2016-04-02 DIAGNOSIS — Z79899 Other long term (current) drug therapy: Secondary | ICD-10-CM | POA: Diagnosis not present

## 2016-04-02 DIAGNOSIS — I890 Lymphedema, not elsewhere classified: Secondary | ICD-10-CM | POA: Insufficient documentation

## 2016-04-02 DIAGNOSIS — Z5181 Encounter for therapeutic drug level monitoring: Secondary | ICD-10-CM

## 2016-04-02 DIAGNOSIS — I972 Postmastectomy lymphedema syndrome: Secondary | ICD-10-CM | POA: Diagnosis not present

## 2016-04-02 MED ORDER — OXYCODONE HCL 5 MG PO TABS: 5.0000 mg | ORAL_TABLET | Freq: Four times a day (QID) | ORAL | 0 refills | Status: DC | PRN

## 2016-04-02 NOTE — Therapy (Signed)
[place], [place], [address] Phone: [phone]   Fax:  [phone]  Physical Therapy Treatment  Patient Details  Name: Karen Hopkins MRN: [id number] Date of Birth: 31-Oct-1975 Referring Provider: Danella Sensing, NP  Encounter Date: 04/02/2016      PT End of Session - 04/02/16 0957    Visit Number 13   Number of Visits 19   Date for PT Re-Evaluation 04/16/16   PT Start Time 0932   PT Stop Time 0954   PT Time Calculation (min) 22 min   Activity Tolerance Patient limited by lethargy   Behavior During Therapy [facility] for tasks assessed/performed      Past Medical History:  Diagnosis Date  . Anxiety   . Cancer ([place])   . Depression   . Essential hypertension 11/05/2015  . History of breast cancer 2011   right  . History of chemotherapy 2011  . History of radiation therapy 2011  . Hypertension    under control with med., has been on med. x 2 yr.  . Lymphedema of arm    right; no BP or puncture to right arm  . Seasonal allergies   . Sinus headache     Past Surgical History:  Procedure Laterality Date  . BREAST REDUCTION SURGERY Left 08/23/2013   Procedure: LEFT BREAST REDUCTION  ;  Surgeon: Theodoro Kos, DO;  Location: [place];  Service: Plastics;  Laterality: Left;  . CESAREAN SECTION  07/15/2001; 03/18/2004; 11/21/2008  . LATISSIMUS FLAP TO BREAST Right 03/12/2013   Procedure: RIGHT BREAST LATISSIMUS FLAP WITH EXPANDER PLACEMENT;  Surgeon: Theodoro Kos, DO;  Location: [place];  Service: Plastics;  Laterality: Right;  . LIPOSUCTION Bilateral 08/23/2013   Procedure: LIPOSUCTION;  Surgeon: Theodoro Kos, DO;  Location: [place];  Service: Plastics;  Laterality: Bilateral;  . MODIFIED RADICAL MASTECTOMY Right 03/11/2010  . NASAL SEPTUM SURGERY    . PORT-A-CATH REMOVAL Left 12/01/2010  . PORTACATH PLACEMENT Left 09/12/2009  . REMOVAL OF TISSUE EXPANDER AND PLACEMENT OF  IMPLANT Right 08/23/2013   Procedure: REMOVAL RIGHT TISSUE EXPANDER AND PLACEMENT OF IMPLANT TO RIGHT BREAST ;  Surgeon: Theodoro Kos, DO;  Location: [place];  Service: Plastics;  Laterality: Right;  . SUPRA-UMBILICAL HERNIA  4158  . TUBAL LIGATION  11/21/2008    There were no vitals filed for this visit.      Subjective Assessment - 04/02/16 0934    Subjective "I only have time to be wrapped today.  I have that oral surgery appointment today."   Currently in Pain? Yes   Pain Score 10-Worst pain ever   Pain Location Head   Pain Descriptors / Indicators Aching   Aggravating Factors  thinking about oral surgery   Pain Relieving Factors nothing                         [facility] Adult PT Treatment/Exercise - 04/02/16 0001      Self-Care   Self-Care Other Self-Care Comments   Other Self-Care Comments  reviewed instruction in diaphragmatic breathing for stress reduction     Manual Therapy   Compression Bandaging Lotion applied, thick stockinette, did not wrap any fingers today , Artiflex x2, 1-6, 1-10 and 3-12 cm short stretch compression bandages from hand to axilla (applied 12 cm bandages in herring bone starting at upper arm with first 12-cm. bandage and then from wrist to shoulder with 2nd and  3rd 12 cm. bandages).                PT Education - 04/02/16 0957    Education provided Yes   Education Details diaphragmatic breathing for stress reduction   Si(s) Educated Patient   Methods Explanation;Verbal cues;Demonstration   Comprehension Verbalized understanding;Returned demonstration           Short Term Clinic Goals - 03/24/16 1207      CC Short Term Goal  #1   Title Decrease circumference measurement at 10 cm proximal to olecranon on the right to 46.6 cm or less.   Baseline 48.2 on right at eval compared to 44.9 on left; 44.4 cm 03/24/16   Status Achieved     CC Short Term Goal  #2   Title Decrease circumference measurement at  right olecranon to 31.3 cm or less   Baseline 32 cm. on right compared to 30.7 on left; 31.3 on 03/19/16; 30.6 cm 03/24/16   Status Achieved             Long Term Clinic Goals - 03/31/16 1154      CC Long Term Goal  #1   Title Patient will achieve right shoulder flexion and abduction AROM of at least 110 degrees to improve performance of ADLs.   Status Partially Met     CC Long Term Goal  #2   Title Patient will know how to obtain new compression garments to improve management of lymphedema at home.   Status Partially Met     CC Long Term Goal  #3   Title Reduce Lymphedema Life Impact Scale to less than 50 percent impairment.   Status Partially Met     CC Long Term Goal  #4   Title right arm circumference at 15 cm provximal to olecranon will be reduced by 3 sm   Status Partially Met            Plan - 04/02/16 0957    Clinical Impression Statement Patient came in with higher anxiety again today, this time because she has oral surgery scheduled for later today to remove wisdom teeth and one other tooth.  She said she only had time to be bandaged, so that was done along with instruction in diaphragmatic breathing for stress reduction.   Rehab Potential Good   Clinical Impairments Affecting Rehab Potential chronic pain issues   PT Frequency 3x / week   PT Duration 6 weeks   PT Treatment/Interventions ADLs/Self Care Home Management;Electrical Stimulation;Therapeutic exercise;DME Instruction;Patient/family education;Manual techniques;Manual lymph drainage;Compression bandaging;Passive range of motion;Visual/perceptual remediation/compensation;Taping   PT Next Visit Plan Cont manual lymph drainage and compression bandaging of right UE and later begin ROM of Rt UE; assess nighttime garment for appropriate fit when patient brings this in check    Consulted and Agree with Plan of Care Patient      Patient will benefit from skilled therapeutic intervention in order to improve the  following deficits and impairments:  Decreased range of motion, Decreased strength, Increased edema, Impaired UE functional use, Pain  Visit Diagnosis: Postmastectomy lymphedema     Problem List Patient Active Problem List   Diagnosis Date Noted  . Essential hypertension 11/05/2015  . Cervical dystonia 08/25/2015  . Headache 07/09/2015  . Myofacial muscle pain 04/29/2015  . Adhesive capsulitis of right shoulder 04/29/2015  . Type 2 diabetes mellitus without complication ([place]) 82/64/1583  . Lower extremity edema 10/01/2014  . Cancer of overlapping sites of right female breast ([place]) 05/24/2014  .  Myalgia and myositis 05/17/2014  . Neoplasm related pain 03/12/2014  . CN (constipation) 11/26/2013  . H/O reduction mammoplasty 10/30/2013  . Seizure ([place]) 10/06/2013  . Status post bilateral breast implants 08/31/2013  . Acquired absence of breast and absent nipple 08/23/2013  . S/P breast reconstruction, right 08/23/2013  . AC joint arthropathy 07/02/2013  . Routine adult health maintenance 05/29/2013  . Hypokalemia 05/21/2013  . Chronic pain 04/19/2013  . Elevated blood sugar 03/09/2013  . Wheezing 02/22/2013  . Acute bronchitis 02/22/2013  . Lymphedema 05/24/2011  . RHINOSINUSITIS, CHRONIC 04/08/2010  . Obesity 08/04/2006  . DEPRESSIVE DISORDER, NOS 08/04/2006  . RHINITIS, ALLERGIC 08/04/2006    Raidyn Wassink 04/02/2016, 10:00 AM  [place], [place], [address] Phone: [phone]   Fax:  [phone]  Name: Adelyne Marchese Hopkin MRN: [id number] Date of Birth: 11-15-1975  Serafina Royals, PT 04/02/16 10:00 AM

## 2016-04-02 NOTE — Patient Instructions (Signed)

## 2016-04-02 NOTE — Progress Notes (Signed)
Botox injection for Cervical dystonia 64616, G24.3 Increased tone and pain not relieved with medication management and interfering with self-care and mobility Needle: 30g 1" needle electrode Number of units per muscle  50 units trapezius 75 units levator 50 units sternocleidomastoid 12.5 units platysma 12.5 units scalene All injections were done after obtaining appropriate EMG activity and after negative drawback for blood. The patient tolerated the procedure well. Post procedure instructions were given. A followup appointment was made.

## 2016-04-06 ENCOUNTER — Other Ambulatory Visit: Payer: Self-pay

## 2016-04-06 DIAGNOSIS — E876 Hypokalemia: Secondary | ICD-10-CM

## 2016-04-06 DIAGNOSIS — C50811 Malignant neoplasm of overlapping sites of right female breast: Secondary | ICD-10-CM

## 2016-04-06 MED ORDER — ALPRAZOLAM ER 1 MG PO TB24: 1.0000 mg | ORAL_TABLET | Freq: Every day | ORAL | 2 refills | Status: DC

## 2016-04-09 ENCOUNTER — Telehealth: Payer: Self-pay | Admitting: *Deleted

## 2016-04-09 NOTE — Progress Notes (Signed)
Patient notified

## 2016-04-09 NOTE — Telephone Encounter (Signed)
Patient notified of sleep results and recommendations. She voiced verbal understanding and will call back for further work up if her symptoms persist.

## 2016-04-09 NOTE — Telephone Encounter (Signed)
-----   Message from Cristopher Estimable, RN sent at 04/07/2016  5:00 PM EDT ----- Mariann Laster, please notify of results;; no CPAP needed at present.  Consider MLST evaluation if continues to have excessive daytime sleepiness.

## 2016-04-11 LAB — TOXASSURE SELECT,+ANTIDEPR,UR

## 2016-04-12 ENCOUNTER — Ambulatory Visit: Payer: Medicare Other | Attending: Registered Nurse | Admitting: Physical Therapy

## 2016-04-12 DIAGNOSIS — I972 Postmastectomy lymphedema syndrome: Secondary | ICD-10-CM | POA: Insufficient documentation

## 2016-04-12 DIAGNOSIS — M25611 Stiffness of right shoulder, not elsewhere classified: Secondary | ICD-10-CM | POA: Insufficient documentation

## 2016-04-12 DIAGNOSIS — G8929 Other chronic pain: Secondary | ICD-10-CM | POA: Insufficient documentation

## 2016-04-12 DIAGNOSIS — M25511 Pain in right shoulder: Secondary | ICD-10-CM | POA: Insufficient documentation

## 2016-04-12 NOTE — Progress Notes (Signed)
Urine drug screen for this encounter is consistent for prescribed medication 

## 2016-04-14 ENCOUNTER — Ambulatory Visit: Payer: Medicare Other | Admitting: Physical Therapy

## 2016-04-14 DIAGNOSIS — I972 Postmastectomy lymphedema syndrome: Secondary | ICD-10-CM

## 2016-04-14 DIAGNOSIS — M25511 Pain in right shoulder: Secondary | ICD-10-CM | POA: Diagnosis present

## 2016-04-14 DIAGNOSIS — M25611 Stiffness of right shoulder, not elsewhere classified: Secondary | ICD-10-CM | POA: Diagnosis present

## 2016-04-14 DIAGNOSIS — G8929 Other chronic pain: Secondary | ICD-10-CM | POA: Diagnosis present

## 2016-04-14 NOTE — Therapy (Signed)
[name], [place], [address] Phone: [phone]   Fax:  [phone]  Physical Therapy Hopkins  Patient Details  Name: Karen Hopkins MRN: [id number] Date of Birth: [birth year] Referring Provider: Danella Sensing, NP  Encounter Date: 04/14/2016      PT End of Session - 04/14/16 1222    Visit Number 14   Number of Visits 19   Date for PT Re-Evaluation 04/16/16   PT Start Time 1109   PT Stop Time 1155   PT Time Calculation (min) 46 min   Activity Tolerance Patient tolerated Hopkins well   Behavior During Therapy [facility] for tasks assessed/performed      Past Medical History:  Diagnosis Date  . Anxiety   . Cancer ([place])   . Depression   . Essential hypertension 11/05/2015  . History of breast cancer 2011   right  . History of chemotherapy 2011  . History of radiation therapy 2011  . Hypertension    under control with med., has been on med. x 2 yr.  . Lymphedema of arm    right; no BP or puncture to right arm  . Seasonal allergies   . Sinus headache     Past Surgical History:  Procedure Laterality Date  . BREAST REDUCTION SURGERY Left 08/23/2013   Procedure: LEFT BREAST REDUCTION  ;  Surgeon: Karen Kos, DO;  Location: [place];  Service: Plastics;  Laterality: Left;  . CESAREAN SECTION  07/15/2001; 03/18/2004; 11/21/2008  . LATISSIMUS FLAP TO BREAST Right 03/12/2013   Procedure: RIGHT BREAST LATISSIMUS FLAP WITH EXPANDER PLACEMENT;  Surgeon: Karen Kos, DO;  Location: [place];  Service: Plastics;  Laterality: Right;  . LIPOSUCTION Bilateral 08/23/2013   Procedure: LIPOSUCTION;  Surgeon: Karen Kos, DO;  Location: [place];  Service: Plastics;  Laterality: Bilateral;  . MODIFIED RADICAL MASTECTOMY Right 03/11/2010  . NASAL SEPTUM SURGERY    . PORT-A-CATH REMOVAL Left 12/01/2010  . PORTACATH PLACEMENT Left 09/12/2009  . REMOVAL OF TISSUE EXPANDER AND PLACEMENT  OF IMPLANT Right 08/23/2013   Procedure: REMOVAL RIGHT TISSUE EXPANDER AND PLACEMENT OF IMPLANT TO RIGHT BREAST ;  Surgeon: Karen Kos, DO;  Location: [place];  Service: Plastics;  Laterality: Right;  . SUPRA-UMBILICAL HERNIA  [id number]  . TUBAL LIGATION  11/21/2008    There were no vitals filed for this visit.      Subjective Assessment - 04/14/16 1110    Subjective Got measured at home for sleeves yesterday; will get four sleeves this time   Currently in Pain? Yes   Pain Score 5    Pain Location Chest   Pain Orientation Right   Aggravating Factors  cold weather   Pain Relieving Factors pain pills, getting inthe bed               LYMPHEDEMA/ONCOLOGY QUESTIONNAIRE - 04/14/16 1112      Right Upper Extremity Lymphedema   15 cm Proximal to Olecranon Process 45 cm   10 cm Proximal to Olecranon Process 45.3 cm   Olecranon Process 30.4 cm   15 cm Proximal to Ulnar Styloid Process 29 cm   10 cm Proximal to Ulnar Styloid Process 24.9 cm   Just Proximal to Ulnar Styloid Process 18 cm   Across Hand at [employer] 20.7 cm   At [place] of 2nd Digit 6 cm   Other has been out of the bandages over a week  Karen Hopkins/Exercise - 04/14/16 0001      Manual Therapy   Edema Management circumference measurement taken   Manual Lymphatic Drainage (MLD) In Supine: Short neck, superficial and deep abdominals, Rt inguinal and Lt axillary nodes, Rt axillo-inguinal and anterior inter-axillary anastomosis, then Rt UE from lateral shoulder to dorsal hand working proximal to distal then retracing all steps.   Compression Bandaging Lotion applied, thick stockinette, did not wrap any fingers today , Artiflex x2, 1-6, 1-10 and 3-12 cm short stretch compression bandages from hand to axilla (applied 12 cm bandages in herring bone starting at upper arm with first 12-cm. bandage and then from wrist to shoulder with 2nd and 3rd 12 cm. bandages).                    Short Term Clinic Goals - 03/24/16 1207      CC Short Term Goal  #1   Title Decrease circumference measurement at 10 cm proximal to olecranon on the right to 46.6 cm or less.   Baseline 48.2 on right at eval compared to 44.9 on left; 44.4 cm 03/24/16   Status Achieved     CC Short Term Goal  #2   Title Decrease circumference measurement at right olecranon to 31.3 cm or less   Baseline 32 cm. on right compared to 30.7 on left; 31.3 on 03/19/16; 30.6 cm 03/24/16   Status Achieved             Long Term Clinic Goals - 04/14/16 1226      CC Long Term Goal  #1   Title Patient will achieve right shoulder flexion and abduction AROM of at least 110 degrees to improve performance of ADLs.   Status Partially Met     CC Long Term Goal  #2   Title Patient will know how to obtain new compression garments to improve management of lymphedema at home.   Status Achieved     CC Long Term Goal  #3   Title Reduce Lymphedema Life Impact Scale to less than 50 percent impairment.   Status Partially Met     CC Long Term Goal  #4   Title right arm circumference at 15 cm proximal to olecranon will be reduced by 3 sm   Status Achieved            Plan - 04/14/16 1223    Clinical Impression Statement Patient came in today not having had bandages on in over week because she didn't have appointments last week and didn't come two days ago.  Despite that, she had measurements that were about the same as at last visit with a reduction at the top of her arm.  She has now been measured for new daytime garments, so is waiting to receive those.     Rehab Potential Good   Clinical Impairments Affecting Rehab Potential chronic pain issues   PT Frequency 3x / week   PT Duration 6 weeks   PT Hopkins/Interventions ADLs/Self Care Home Management;Electrical Stimulation;Therapeutic exercise;DME Instruction;Patient/family education;Manual techniques;Manual lymph drainage;Compression  bandaging;Passive range of motion;Visual/perceptual remediation/compensation;Taping   PT Next Visit Plan Repeat lymphedema life impact scale, check shoulder ROM.  Will need renewal next visit.  Continue complete decongestive therapy.  Pt. now waiting for garments to arrive   Recommended Other Services She has been fitted for new garments by Karen Hopkins of [facility].   Consulted and Agree with Plan of Care Patient      Patient  will benefit from skilled therapeutic intervention in order to improve the following deficits and impairments:  Decreased range of motion, Decreased strength, Increased edema, Impaired UE functional use, Pain  Visit Diagnosis: Postmastectomy lymphedema     Problem List Patient Active Problem List   Diagnosis Date Noted  . Essential hypertension 11/05/2015  . Cervical dystonia 08/25/2015  . Headache 07/09/2015  . Myofacial muscle pain 04/29/2015  . Adhesive capsulitis of right shoulder 04/29/2015  . Type 2 diabetes mellitus without complication ([place]) 42/48/1443  . Lower extremity edema 10/01/2014  . Cancer of overlapping sites of right female breast ([place]) 05/24/2014  . Myalgia and myositis 05/17/2014  . Neoplasm related pain 03/12/2014  . CN (constipation) 11/26/2013  . H/O reduction mammoplasty 10/30/2013  . Seizure ([place]) 10/06/2013  . Status post bilateral breast implants 08/31/2013  . Acquired absence of breast and absent nipple 08/23/2013  . S/P breast reconstruction, right 08/23/2013  . AC joint arthropathy 07/02/2013  . Routine adult health maintenance 05/29/2013  . Hypokalemia 05/21/2013  . Chronic pain 04/19/2013  . Elevated blood sugar 03/09/2013  . Wheezing 02/22/2013  . Acute bronchitis 02/22/2013  . Lymphedema 05/24/2011  . RHINOSINUSITIS, CHRONIC 04/08/2010  . Obesity 08/04/2006  . DEPRESSIVE DISORDER, NOS 08/04/2006  . RHINITIS, ALLERGIC 08/04/2006    SALISBURY,DONNA 04/14/2016, 12:29 PM  [place], [place], [address] Phone: [phone]   Fax:  [phone]  Name: Karen Hopkins MRN: [id number] Date of Birth: [birth year]  Serafina Royals, PT 04/14/16 12:30 PM

## 2016-04-16 ENCOUNTER — Ambulatory Visit: Payer: Medicare Other | Admitting: Physical Therapy

## 2016-04-16 DIAGNOSIS — I972 Postmastectomy lymphedema syndrome: Secondary | ICD-10-CM

## 2016-04-16 DIAGNOSIS — M25611 Stiffness of right shoulder, not elsewhere classified: Secondary | ICD-10-CM

## 2016-04-16 DIAGNOSIS — G8929 Other chronic pain: Secondary | ICD-10-CM

## 2016-04-16 DIAGNOSIS — M25511 Pain in right shoulder: Secondary | ICD-10-CM

## 2016-04-16 NOTE — Therapy (Signed)
[place], [place], [address] Phone: [phone]   Fax:  [phone]  Physical Therapy Treatment  Patient Details  Name: Karen Hopkins MRN: [id number] Date of Birth: May 13, 1976 Referring Provider: Danella Sensing, NP  Encounter Date: 04/16/2016      PT End of Session - 04/16/16 1154    Visit Number 15   Number of Visits 27   Date for PT Re-Evaluation 05/21/16   PT Start Time 1107   PT Stop Time 1145   PT Time Calculation (min) 38 min   Activity Tolerance Patient tolerated treatment well   Behavior During Therapy [facility] for tasks assessed/performed      Past Medical History:  Diagnosis Date  . Anxiety   . Cancer ([place])   . Depression   . Essential hypertension 11/05/2015  . History of breast cancer 2011   right  . History of chemotherapy 2011  . History of radiation therapy 2011  . Hypertension    under control with med., has been on med. x 2 yr.  . Lymphedema of arm    right; no BP or puncture to right arm  . Seasonal allergies   . Sinus headache     Past Surgical History:  Procedure Laterality Date  . BREAST REDUCTION SURGERY Left 08/23/2013   Procedure: LEFT BREAST REDUCTION  ;  Surgeon: Theodoro Kos, DO;  Location: [place];  Service: Plastics;  Laterality: Left;  . CESAREAN SECTION  07/15/2001; 03/18/2004; 11/21/2008  . LATISSIMUS FLAP TO BREAST Right 03/12/2013   Procedure: RIGHT BREAST LATISSIMUS FLAP WITH EXPANDER PLACEMENT;  Surgeon: Theodoro Kos, DO;  Location: [place];  Service: Plastics;  Laterality: Right;  . LIPOSUCTION Bilateral 08/23/2013   Procedure: LIPOSUCTION;  Surgeon: Theodoro Kos, DO;  Location: [place];  Service: Plastics;  Laterality: Bilateral;  . MODIFIED RADICAL MASTECTOMY Right 03/11/2010  . NASAL SEPTUM SURGERY    . PORT-A-CATH REMOVAL Left 12/01/2010  . PORTACATH PLACEMENT Left 09/12/2009  . REMOVAL OF TISSUE EXPANDER AND PLACEMENT  OF IMPLANT Right 08/23/2013   Procedure: REMOVAL RIGHT TISSUE EXPANDER AND PLACEMENT OF IMPLANT TO RIGHT BREAST ;  Surgeon: Theodoro Kos, DO;  Location: [place];  Service: Plastics;  Laterality: Right;  . SUPRA-UMBILICAL HERNIA  [id number]  . TUBAL LIGATION  11/21/2008    There were no vitals filed for this visit.      Subjective Assessment - 04/16/16 1109    Subjective "I'm tired."  Took bandages off last night.   Currently in Pain? Yes                         [facility] Adult PT Treatment/Exercise - 04/16/16 0001      Manual Therapy   Manual Lymphatic Drainage (MLD) In Supine: Short neck, superficial and deep abdominals, Rt inguinal and Lt axillary nodes, Rt axillo-inguinal and anterior inter-axillary anastomosis, then Rt UE from lateral shoulder to dorsal hand working proximal to distal then retracing all steps.   Compression Bandaging Lotion applied, thick stockinette, did not wrap any fingers today , Artiflex x2, 1-6, 1-10 and 3-12 cm short stretch compression bandages from hand to axilla (applied 12 cm bandages in herring bone starting at upper arm with first 12-cm. bandage and then from wrist to shoulder with 2nd and 3rd 12 cm. bandages).                   Short Term  Clinic Goals - 03/24/16 1207      CC Short Term Goal  #1   Title Decrease circumference measurement at 10 cm proximal to olecranon on the right to 46.6 cm or less.   Baseline 48.2 on right at eval compared to 44.9 on left; 44.4 cm 03/24/16   Status Achieved     CC Short Term Goal  #2   Title Decrease circumference measurement at right olecranon to 31.3 cm or less   Baseline 32 cm. on right compared to 30.7 on left; 31.3 on 03/19/16; 30.6 cm 03/24/16   Status Achieved             Long Term Clinic Goals - 04/14/16 1226      CC Long Term Goal  #1   Title Patient will achieve right shoulder flexion and abduction AROM of at least 110 degrees to improve performance of ADLs.    Status Partially Met     CC Long Term Goal  #2   Title Patient will know how to obtain new compression garments to improve management of lymphedema at home.   Status Achieved     CC Long Term Goal  #3   Title Reduce Lymphedema Life Impact Scale to less than 50 percent impairment.   Status Partially Met     CC Long Term Goal  #4   Title right arm circumference at 15 cm proximal to olecranon will be reduced by 3 sm   Status Achieved            Plan - 04/16/16 1155    Clinical Impression Statement Patient is doing well.  Her swelling has been maintaining even during a couple of times when she was out of the bandages for several days, and has reduced overall.  She has been measured for new daytime compression garments and is waiting to receive those, so will continue bandaging until then.   Rehab Potential Good   Clinical Impairments Affecting Rehab Potential chronic pain issues   PT Frequency 3x / week   PT Duration 4 weeks   PT Treatment/Interventions ADLs/Self Care Home Management;Electrical Stimulation;Therapeutic exercise;DME Instruction;Patient/family education;Manual techniques;Manual lymph drainage;Compression bandaging;Passive range of motion;Visual/perceptual remediation/compensation;Taping   PT Next Visit Plan Repeat lymphedema life impact scale, check shoulder ROM. Continue complete decongestive therapy.  Pt. now waiting for garments to arrive   Consulted and Agree with Plan of Care Patient      Patient will benefit from skilled therapeutic intervention in order to improve the following deficits and impairments:  Decreased range of motion, Decreased strength, Increased edema, Impaired UE functional use, Pain  Visit Diagnosis: Postmastectomy lymphedema - Plan: PT plan of care cert/re-cert  Chronic right shoulder pain - Plan: PT plan of care cert/re-cert  Stiffness of right shoulder, not elsewhere classified - Plan: PT plan of care cert/re-cert     Problem  List Patient Active Problem List   Diagnosis Date Noted  . Essential hypertension 11/05/2015  . Cervical dystonia 08/25/2015  . Headache 07/09/2015  . Myofacial muscle pain 04/29/2015  . Adhesive capsulitis of right shoulder 04/29/2015  . Type 2 diabetes mellitus without complication ([place]) 21/30/8657  . Lower extremity edema 10/01/2014  . Cancer of overlapping sites of right female breast ([place]) 05/24/2014  . Myalgia and myositis 05/17/2014  . Neoplasm related pain 03/12/2014  . CN (constipation) 11/26/2013  . H/O reduction mammoplasty 10/30/2013  . Seizure ([place]) 10/06/2013  . Status post bilateral breast implants 08/31/2013  . Acquired absence of breast and  absent nipple 08/23/2013  . S/P breast reconstruction, right 08/23/2013  . AC joint arthropathy 07/02/2013  . Routine adult health maintenance 05/29/2013  . Hypokalemia 05/21/2013  . Chronic pain 04/19/2013  . Elevated blood sugar 03/09/2013  . Wheezing 02/22/2013  . Acute bronchitis 02/22/2013  . Lymphedema 05/24/2011  . RHINOSINUSITIS, CHRONIC 04/08/2010  . Obesity 08/04/2006  . DEPRESSIVE DISORDER, NOS 08/04/2006  . RHINITIS, ALLERGIC 08/04/2006    Karen Hopkins 04/16/2016, 12:03 PM  [place], [place], [address] Phone: [phone]   Fax:  [phone]  Name: Karen Hopkins MRN: [id number] Date of Birth: 1976-04-28  Serafina Royals, PT 04/16/16 12:03 PM

## 2016-04-19 ENCOUNTER — Other Ambulatory Visit: Payer: Self-pay | Admitting: Cardiovascular Disease

## 2016-04-19 ENCOUNTER — Other Ambulatory Visit: Payer: Self-pay | Admitting: *Deleted

## 2016-04-19 DIAGNOSIS — C50811 Malignant neoplasm of overlapping sites of right female breast: Secondary | ICD-10-CM

## 2016-04-19 NOTE — Telephone Encounter (Signed)
Review for refill. 

## 2016-04-20 ENCOUNTER — Ambulatory Visit (HOSPITAL_BASED_OUTPATIENT_CLINIC_OR_DEPARTMENT_OTHER): Payer: Medicare Other

## 2016-04-20 ENCOUNTER — Other Ambulatory Visit (HOSPITAL_BASED_OUTPATIENT_CLINIC_OR_DEPARTMENT_OTHER): Payer: Medicare Other

## 2016-04-20 ENCOUNTER — Other Ambulatory Visit: Payer: Self-pay | Admitting: *Deleted

## 2016-04-20 VITALS — BP 127/73 | HR 92 | Temp 97.8°F | Resp 18

## 2016-04-20 DIAGNOSIS — C50811 Malignant neoplasm of overlapping sites of right female breast: Secondary | ICD-10-CM

## 2016-04-20 DIAGNOSIS — Z5111 Encounter for antineoplastic chemotherapy: Secondary | ICD-10-CM | POA: Diagnosis not present

## 2016-04-20 DIAGNOSIS — C50911 Malignant neoplasm of unspecified site of right female breast: Secondary | ICD-10-CM

## 2016-04-20 LAB — COMPREHENSIVE METABOLIC PANEL
ALT: 17 U/L (ref 0–55)
AST: 12 U/L (ref 5–34)
Albumin: 3.7 g/dL (ref 3.5–5.0)
Alkaline Phosphatase: 113 U/L (ref 40–150)
Anion Gap: 11 mEq/L (ref 3–11)
BUN: 9 mg/dL (ref 7.0–26.0)
CHLORIDE: 108 meq/L (ref 98–109)
CO2: 24 mEq/L (ref 22–29)
Calcium: 9.5 mg/dL (ref 8.4–10.4)
Creatinine: 0.8 mg/dL (ref 0.6–1.1)
EGFR: >90 mL/min/{1.73_m2} (ref >90)
Glucose: 90 mg/dl (ref 70–140)
POTASSIUM: 3 meq/L — AB (ref 3.5–5.1)
Sodium: 143 mEq/L (ref 136–145)
Total Bilirubin: 0.35 mg/dL (ref 0.20–1.20)
Total Protein: 8.3 g/dL (ref 6.4–8.3)

## 2016-04-20 LAB — CBC WITH DIFFERENTIAL/PLATELET
BASO%: 0.5 % (ref 0.0–2.0)
BASOS ABS: 0 10*3/uL (ref 0.0–0.1)
EOS%: 5.3 % (ref 0.0–7.0)
Eosinophils Absolute: 0.5 10*3/uL (ref 0.0–0.5)
HCT: 40.7 % (ref 34.8–46.6)
HGB: 13.1 g/dL (ref 11.6–15.9)
LYMPH%: 40.4 % (ref 14.0–49.7)
MCH: 27 pg (ref 25.1–34.0)
MCHC: 32.1 g/dL (ref 31.5–36.0)
MCV: 84.3 fL (ref 79.5–101.0)
MONO#: 0.6 10*3/uL (ref 0.1–0.9)
MONO%: 6.8 % (ref 0.0–14.0)
NEUT#: 4.1 10*3/uL (ref 1.5–6.5)
NEUT%: 47 % (ref 38.4–76.8)
Platelets: 316 10*3/uL (ref 145–400)
RBC: 4.83 10*6/uL (ref 3.70–5.45)
RDW: 15.6 % — AB (ref 11.2–14.5)
WBC: 8.8 10*3/uL (ref 3.9–10.3)
lymph#: 3.5 10*3/uL — ABNORMAL HIGH (ref 0.9–3.3)

## 2016-04-20 MED ORDER — POTASSIUM CHLORIDE ER 10 MEQ PO TBCR: 20.0000 meq | EXTENDED_RELEASE_TABLET | Freq: Three times a day (TID) | ORAL | 1 refills | Status: DC

## 2016-04-20 MED ORDER — GOSERELIN ACETATE 3.6 MG ~~LOC~~ IMPL
3.6000 mg | DRUG_IMPLANT | Freq: Once | SUBCUTANEOUS | Status: AC
  Administered 2016-04-20: 3.6 mg via SUBCUTANEOUS
  Filled 2016-04-20: qty 3.6

## 2016-04-20 NOTE — Patient Instructions (Signed)
Goserelin injection What is this medicine? GOSERELIN (GOE se rel in) is similar to a hormone found in the body. It lowers the amount of sex hormones that the body makes. Men will have lower testosterone levels and women will have lower estrogen levels while taking this medicine. In men, this medicine is used to treat prostate cancer; the injection is either given once per month or once every 12 weeks. A once per month injection (only) is used to treat women with endometriosis, dysfunctional uterine bleeding, or advanced breast cancer. This medicine may be used for other purposes; ask your health care provider or pharmacist if you have questions. What should I tell my health care provider before I take this medicine? They need to know if you have any of these conditions (some only apply to women): -diabetes -heart disease or previous heart attack -high blood pressure -high cholesterol -kidney disease -osteoporosis or low bone density -problems passing urine -spinal cord injury -stroke -tobacco smoker -an unusual or allergic reaction to goserelin, hormone therapy, other medicines, foods, dyes, or preservatives -pregnant or trying to get pregnant -breast-feeding How should I use this medicine? This medicine is for injection under the skin. It is given by a health care professional in a hospital or clinic setting. Men receive this injection once every 4 weeks or once every 12 weeks. Women will only receive the once every 4 weeks injection. Talk to your pediatrician regarding the use of this medicine in children. Special care may be needed. Overdosage: If you think you have taken too much of this medicine contact a poison control center or emergency room at once. NOTE: This medicine is only for you. Do not share this medicine with others. What if I miss a dose? It is important not to miss your dose. Call your doctor or health care professional if you are unable to keep an appointment. What may  interact with this medicine? -female hormones like estrogen -herbal or dietary supplements like black cohosh, chasteberry, or DHEA -female hormones like testosterone -prasterone This list may not describe all possible interactions. Give your health care provider a list of all the medicines, herbs, non-prescription drugs, or dietary supplements you use. Also tell them if you smoke, drink alcohol, or use illegal drugs. Some items may interact with your medicine. What should I watch for while using this medicine? Visit your doctor or health care professional for regular checks on your progress. Your symptoms may appear to get worse during the first weeks of this therapy. Tell your doctor or healthcare professional if your symptoms do not start to get better or if they get worse after this time. Your bones may get weaker if you take this medicine for a long time. If you smoke or frequently drink alcohol you may increase your risk of bone loss. A family history of osteoporosis, chronic use of drugs for seizures (convulsions), or corticosteroids can also increase your risk of bone loss. Talk to your doctor about how to keep your bones strong. This medicine should stop regular monthly menstration in women. Tell your doctor if you continue to menstrate. Women should not become pregnant while taking this medicine or for 12 weeks after stopping this medicine. Women should inform their doctor if they wish to become pregnant or think they might be pregnant. There is a potential for serious side effects to an unborn child. Talk to your health care professional or pharmacist for more information. Do not breast-feed an infant while taking this medicine. Men should   inform their doctors if they wish to father a child. This medicine may lower sperm counts. Talk to your health care professional or pharmacist for more information. What side effects may I notice from receiving this medicine? Side effects that you should  report to your doctor or health care professional as soon as possible: -allergic reactions like skin rash, itching or hives, swelling of the face, lips, or tongue -bone pain -breathing problems -changes in vision -chest pain -feeling faint or lightheaded, falls -fever, chills -pain, swelling, warmth in the leg -pain, tingling, numbness in the hands or feet -signs and symptoms of low blood pressure like dizziness; feeling faint or lightheaded, falls; unusually weak or tired -stomach pain -swelling of the ankles, feet, hands -trouble passing urine or change in the amount of urine -unusually high or low blood pressure -unusually weak or tired Side effects that usually do not require medical attention (report to your doctor or health care professional if they continue or are bothersome): -change in sex drive or performance -changes in breast size in both males and females -changes in emotions or moods -headache -hot flashes -irritation at site where injected -loss of appetite -skin problems like acne, dry skin -vaginal dryness This list may not describe all possible side effects. Call your doctor for medical advice about side effects. You may report side effects to FDA at 1-800-FDA-1088. Where should I keep my medicine? This drug is given in a hospital or clinic and will not be stored at home. NOTE: This sheet is a summary. It may not cover all possible information. If you have questions about this medicine, talk to your doctor, pharmacist, or health care provider.    2016, Elsevier/Gold Standard. (2013-07-31 11:10:35)  

## 2016-04-23 ENCOUNTER — Other Ambulatory Visit: Payer: Self-pay | Admitting: *Deleted

## 2016-05-03 ENCOUNTER — Encounter: Payer: Medicare Other | Attending: Registered Nurse | Admitting: Registered Nurse

## 2016-05-03 ENCOUNTER — Encounter: Payer: Self-pay | Admitting: Registered Nurse

## 2016-05-03 VITALS — BP 113/78 | HR 101

## 2016-05-03 DIAGNOSIS — I890 Lymphedema, not elsewhere classified: Secondary | ICD-10-CM | POA: Diagnosis not present

## 2016-05-03 DIAGNOSIS — G243 Spasmodic torticollis: Secondary | ICD-10-CM

## 2016-05-03 DIAGNOSIS — I639 Cerebral infarction, unspecified: Secondary | ICD-10-CM | POA: Diagnosis not present

## 2016-05-03 DIAGNOSIS — G8929 Other chronic pain: Secondary | ICD-10-CM

## 2016-05-03 DIAGNOSIS — M791 Myalgia: Secondary | ICD-10-CM | POA: Diagnosis not present

## 2016-05-03 DIAGNOSIS — M545 Low back pain: Secondary | ICD-10-CM | POA: Diagnosis not present

## 2016-05-03 DIAGNOSIS — M7918 Myalgia, other site: Secondary | ICD-10-CM

## 2016-05-03 DIAGNOSIS — M546 Pain in thoracic spine: Secondary | ICD-10-CM | POA: Insufficient documentation

## 2016-05-03 DIAGNOSIS — Z5181 Encounter for therapeutic drug level monitoring: Secondary | ICD-10-CM

## 2016-05-03 DIAGNOSIS — Z79899 Other long term (current) drug therapy: Secondary | ICD-10-CM

## 2016-05-03 MED ORDER — OXYCODONE HCL 5 MG PO TABS: 5.0000 mg | ORAL_TABLET | Freq: Four times a day (QID) | ORAL | 0 refills | Status: DC | PRN

## 2016-05-03 NOTE — Progress Notes (Signed)
Subjective:    Patient ID: Karen Hopkins, female    DOB: April 13, 1976, 40 y.o.   MRN: [id number]  HPI: [name] is a 40 year old female who returns for follow up appointment for chronic pain and medication refill. She states her pain is located in her neck radiating into her right shoulder and mid- lower back.She rates her pain 7. Her current exercise regime is walking and performing stretching exercises.  S/p Botox with good results noted.  [name] returned the October 27th prescription, she misunderstood and didn't fill the prescription, she has been out of medication for 9 days. Educated on the prescription she verbalizes understanding.   Pain Inventory Average Pain 6 Pain Right Now 7 My pain is sharp, burning, dull, stabbing, tingling and aching  In the last 24 hours, has pain interfered with the following? General activity 7 Relation with others 7 Enjoyment of life 7 What TIME of day is your pain at its worst? all Sleep (in general) Poor  Pain is worse with: bending, inactivity, standing and some activites Pain improves with: rest, therapy/exercise, pacing activities, medication and injections Relief from Meds: 7  Mobility walk without assistance how many minutes can you walk? 10 ability to climb steps?  yes do you drive?  yes  Function disabled: date disabled .  Neuro/Psych spasms anxiety  Prior Studies Any changes since last visit?  no  Physicians involved in your care Any changes since last visit?  no   Family History  Problem Relation Age of Onset  . Hypertension Mother   . Diabetes type II Father   . Prostate cancer Father   . Stroke Neg Hx    Social History   Social History  . Marital status: Single    Spouse name: N/A  . Number of children: 3  . Years of education: 14   Occupational History  . Disability     Social History Main Topics  . Smoking status: Former Smoker    Packs/day: 0.25    Years: 5.00    Quit date:  04/26/2010  . Smokeless tobacco: Never Used  . Alcohol use No  . Drug use: No  . Sexual activity: Not Currently    Birth control/ protection: Surgical   Other Topics Concern  . None   Social History Narrative   Lives with kids   Caffeine use: none    Past Surgical History:  Procedure Laterality Date  . BREAST REDUCTION SURGERY Left 08/23/2013   Procedure: LEFT BREAST REDUCTION  ;  Surgeon: Theodoro Kos, DO;  Location: [place];  Service: Plastics;  Laterality: Left;  . CESAREAN SECTION  07/15/2001; 03/18/2004; 11/21/2008  . LATISSIMUS FLAP TO BREAST Right 03/12/2013   Procedure: RIGHT BREAST LATISSIMUS FLAP WITH EXPANDER PLACEMENT;  Surgeon: Theodoro Kos, DO;  Location: [place];  Service: Plastics;  Laterality: Right;  . LIPOSUCTION Bilateral 08/23/2013   Procedure: LIPOSUCTION;  Surgeon: Theodoro Kos, DO;  Location: [place];  Service: Plastics;  Laterality: Bilateral;  . MODIFIED RADICAL MASTECTOMY Right 03/11/2010  . NASAL SEPTUM SURGERY    . PORT-A-CATH REMOVAL Left 12/01/2010  . PORTACATH PLACEMENT Left 09/12/2009  . REMOVAL OF TISSUE EXPANDER AND PLACEMENT OF IMPLANT Right 08/23/2013   Procedure: REMOVAL RIGHT TISSUE EXPANDER AND PLACEMENT OF IMPLANT TO RIGHT BREAST ;  Surgeon: Theodoro Kos, DO;  Location: [place];  Service: Plastics;  Laterality: Right;  . SUPRA-UMBILICAL HERNIA  [id number]  . TUBAL LIGATION  11/21/2008   Past Medical History:  Diagnosis Date  . Anxiety   . Cancer ([place])   . Depression   . Essential hypertension 11/05/2015  . History of breast cancer 2011   right  . History of chemotherapy 2011  . History of radiation therapy 2011  . Hypertension    under control with med., has been on med. x 2 yr.  . Lymphedema of arm    right; no BP or puncture to right arm  . Seasonal allergies   . Sinus headache    BP 113/78 (BP Location: Left Arm, Patient Position: Sitting, Cuff Size: Large)   Pulse (!) 101   SpO2 96%    Opioid Risk Score:   Fall Risk Score:  `1  Depression screen PHQ 2/9  Depression screen [facility] 2/9 08/04/2015 06/25/2015 03/04/2015 01/20/2015 01/07/2015 11/21/2014 10/07/2014  Decreased Interest 0 0 0 0 0 0 0  Down, Depressed, Hopeless 0 0 0 0 0 0 0  PHQ - 2 Score 0 0 0 0 0 0 0  Altered sleeping - - - - - - -  Tired, decreased energy - - - - - - -  Change in appetite - - - - - - -  Feeling bad or failure about yourself  - - - - - - -  Trouble concentrating - - - - - - -  Moving slowly or fidgety/restless - - - - - - -  Suicidal thoughts - - - - - - -  PHQ-9 Score - - - - - - -  Some recent data might be hidden   Review of Systems  Constitutional: Negative.   HENT: Negative.   Eyes: Negative.   Respiratory: Negative.   Cardiovascular: Negative.   Gastrointestinal: Negative.   Endocrine: Negative.   Genitourinary: Negative.   Musculoskeletal: Positive for arthralgias.       Spasams  Skin: Negative.   Allergic/Immunologic: Negative.   Neurological: Negative.   Hematological: Negative.   Psychiatric/Behavioral: The patient is nervous/anxious.   All other systems reviewed and are negative.      Objective:   Physical Exam  Constitutional: She is oriented to [place], place, and time. She appears well-developed and well-nourished.  HENT:  Head: Normocephalic and atraumatic.  Neck: Normal range of motion. Neck supple.  Cervical Paraspinal Tenderness: C-5-C-6  Cardiovascular: Normal rate and regular rhythm.   Pulmonary/Chest: Effort normal and breath sounds normal.  Musculoskeletal:  Normal Muscle Bulk and Muscle Testing Reveals: Upper Extremities: Right: Decreased ROM 45 Degrees and Muscle Strength  4/5 Right AC Joint Tenderness Left: Full ROM and Muscle Strength 5/5 Thoracic Paraspinal Tenderness: T-1-T-5 Mainly Right Side Lower Extremities: Full ROM and Muscle Strength 5/5 Arises from Table with ease Narrow Based Gait    Neurological: She is alert and oriented to [place],  place, and time.  Skin: Skin is warm and dry.  Psychiatric: She has a normal mood and affect.  Nursing note and vitals reviewed.         Assessment & Plan:  1.Myofascial pain syndrome chronic postoperative, as well as post radiation: S/P Breast Reconstruction Surgery x 2.  Refilled: Oxycodone 5 mg one tablet every 6 hours as needed for severe pain.# 120. Continue HEP. We will continue the opioid monitoring program, this consists of regular clinic visits, examinations, urine drug screen, pill counts as well as use of [place] Controlled Substance Reporting System. 2. Neoplasm Related Pain: Continue Current Medication Regime 3. Obesity: Losing weight continue with Healthy  Diet and Exercise Regime.  4. Muscle Spasms: Continue Tizanidine

## 2016-05-10 ENCOUNTER — Telehealth: Payer: Self-pay | Admitting: Oncology

## 2016-05-10 NOTE — Telephone Encounter (Signed)
Faxed office notes to [facility] [phone] °

## 2016-05-17 ENCOUNTER — Other Ambulatory Visit: Payer: Self-pay

## 2016-05-17 DIAGNOSIS — C50811 Malignant neoplasm of overlapping sites of right female breast: Secondary | ICD-10-CM

## 2016-05-18 ENCOUNTER — Other Ambulatory Visit: Payer: Medicare Other

## 2016-05-18 ENCOUNTER — Other Ambulatory Visit: Payer: Self-pay | Admitting: Cardiovascular Disease

## 2016-05-18 ENCOUNTER — Ambulatory Visit: Payer: Medicare Other

## 2016-05-19 ENCOUNTER — Telehealth: Payer: Self-pay | Admitting: Oncology

## 2016-05-19 NOTE — Telephone Encounter (Signed)
Review for refill. 

## 2016-05-19 NOTE — Telephone Encounter (Signed)
Appointment for lab and injection rescheduled per patient request.

## 2016-05-19 NOTE — Telephone Encounter (Signed)
Rx(s) sent to pharmacy electronically.  

## 2016-05-20 ENCOUNTER — Other Ambulatory Visit (HOSPITAL_BASED_OUTPATIENT_CLINIC_OR_DEPARTMENT_OTHER): Payer: Medicare Other

## 2016-05-20 ENCOUNTER — Ambulatory Visit (HOSPITAL_BASED_OUTPATIENT_CLINIC_OR_DEPARTMENT_OTHER): Payer: Medicare Other

## 2016-05-20 VITALS — BP 118/85 | HR 86 | Temp 98.2°F | Resp 20

## 2016-05-20 DIAGNOSIS — C50811 Malignant neoplasm of overlapping sites of right female breast: Secondary | ICD-10-CM

## 2016-05-20 DIAGNOSIS — Z5111 Encounter for antineoplastic chemotherapy: Secondary | ICD-10-CM

## 2016-05-20 DIAGNOSIS — C50911 Malignant neoplasm of unspecified site of right female breast: Secondary | ICD-10-CM

## 2016-05-20 LAB — COMPREHENSIVE METABOLIC PANEL
ALBUMIN: 3.2 g/dL — AB (ref 3.5–5.0)
ALT: 13 U/L (ref 0–55)
AST: 10 U/L (ref 5–34)
Alkaline Phosphatase: 117 U/L (ref 40–150)
Anion Gap: 8 mEq/L (ref 3–11)
BUN: 10 mg/dL (ref 7.0–26.0)
CALCIUM: 9 mg/dL (ref 8.4–10.4)
CO2: 24 mEq/L (ref 22–29)
Chloride: 108 mEq/L (ref 98–109)
Creatinine: 1 mg/dL (ref 0.6–1.1)
EGFR: 85 mL/min/{1.73_m2} — AB (ref >90)
Glucose: 116 mg/dl (ref 70–140)
POTASSIUM: 3.6 meq/L (ref 3.5–5.1)
Sodium: 140 mEq/L (ref 136–145)
Total Protein: 7.3 g/dL (ref 6.4–8.3)

## 2016-05-20 LAB — CBC WITH DIFFERENTIAL/PLATELET
BASO%: 0.1 % (ref 0.0–2.0)
BASOS ABS: 0 10*3/uL (ref 0.0–0.1)
EOS ABS: 0.5 10*3/uL (ref 0.0–0.5)
EOS%: 5.8 % (ref 0.0–7.0)
HEMATOCRIT: 35.9 % (ref 34.8–46.6)
HGB: 11.6 g/dL (ref 11.6–15.9)
LYMPH#: 3.7 10*3/uL — AB (ref 0.9–3.3)
LYMPH%: 39.7 % (ref 14.0–49.7)
MCH: 27.4 pg (ref 25.1–34.0)
MCHC: 32.3 g/dL (ref 31.5–36.0)
MCV: 84.9 fL (ref 79.5–101.0)
MONO#: 0.4 10*3/uL (ref 0.1–0.9)
MONO%: 4.7 % (ref 0.0–14.0)
NEUT#: 4.6 10*3/uL (ref 1.5–6.5)
NEUT%: 49.7 % (ref 38.4–76.8)
PLATELETS: 293 10*3/uL (ref 145–400)
RBC: 4.23 10*6/uL (ref 3.70–5.45)
RDW: 14.7 % — AB (ref 11.2–14.5)
WBC: 9.3 10*3/uL (ref 3.9–10.3)

## 2016-05-20 MED ORDER — GOSERELIN ACETATE 3.6 MG ~~LOC~~ IMPL
3.6000 mg | DRUG_IMPLANT | Freq: Once | SUBCUTANEOUS | Status: AC
  Administered 2016-05-20: 3.6 mg via SUBCUTANEOUS
  Filled 2016-05-20: qty 3.6

## 2016-05-20 NOTE — Patient Instructions (Signed)
Goserelin injection What is this medicine? GOSERELIN (GOE se rel in) is similar to a hormone found in the body. It lowers the amount of sex hormones that the body makes. Men will have lower testosterone levels and women will have lower estrogen levels while taking this medicine. In men, this medicine is used to treat prostate cancer; the injection is either given once per month or once every 12 weeks. A once per month injection (only) is used to treat women with endometriosis, dysfunctional uterine bleeding, or advanced breast cancer. This medicine may be used for other purposes; ask your health care provider or pharmacist if you have questions. What should I tell my health care provider before I take this medicine? They need to know if you have any of these conditions (some only apply to women): -diabetes -heart disease or previous heart attack -high blood pressure -high cholesterol -kidney disease -osteoporosis or low bone density -problems passing urine -spinal cord injury -stroke -tobacco smoker -an unusual or allergic reaction to goserelin, hormone therapy, other medicines, foods, dyes, or preservatives -pregnant or trying to get pregnant -breast-feeding How should I use this medicine? This medicine is for injection under the skin. It is given by a health care professional in a hospital or clinic setting. Men receive this injection once every 4 weeks or once every 12 weeks. Women will only receive the once every 4 weeks injection. Talk to your pediatrician regarding the use of this medicine in children. Special care may be needed. Overdosage: If you think you have taken too much of this medicine contact a poison control center or emergency room at once. NOTE: This medicine is only for you. Do not share this medicine with others. What if I miss a dose? It is important not to miss your dose. Call your doctor or health care professional if you are unable to keep an appointment. What may  interact with this medicine? -female hormones like estrogen -herbal or dietary supplements like black cohosh, chasteberry, or DHEA -female hormones like testosterone -prasterone This list may not describe all possible interactions. Give your health care provider a list of all the medicines, herbs, non-prescription drugs, or dietary supplements you use. Also tell them if you smoke, drink alcohol, or use illegal drugs. Some items may interact with your medicine. What should I watch for while using this medicine? Visit your doctor or health care professional for regular checks on your progress. Your symptoms may appear to get worse during the first weeks of this therapy. Tell your doctor or healthcare professional if your symptoms do not start to get better or if they get worse after this time. Your bones may get weaker if you take this medicine for a long time. If you smoke or frequently drink alcohol you may increase your risk of bone loss. A family history of osteoporosis, chronic use of drugs for seizures (convulsions), or corticosteroids can also increase your risk of bone loss. Talk to your doctor about how to keep your bones strong. This medicine should stop regular monthly menstration in women. Tell your doctor if you continue to menstrate. Women should not become pregnant while taking this medicine or for 12 weeks after stopping this medicine. Women should inform their doctor if they wish to become pregnant or think they might be pregnant. There is a potential for serious side effects to an unborn child. Talk to your health care professional or pharmacist for more information. Do not breast-feed an infant while taking this medicine. Men should   inform their doctors if they wish to father a child. This medicine may lower sperm counts. Talk to your health care professional or pharmacist for more information. What side effects may I notice from receiving this medicine? Side effects that you should  report to your doctor or health care professional as soon as possible: -allergic reactions like skin rash, itching or hives, swelling of the face, lips, or tongue -bone pain -breathing problems -changes in vision -chest pain -feeling faint or lightheaded, falls -fever, chills -pain, swelling, warmth in the leg -pain, tingling, numbness in the hands or feet -signs and symptoms of low blood pressure like dizziness; feeling faint or lightheaded, falls; unusually weak or tired -stomach pain -swelling of the ankles, feet, hands -trouble passing urine or change in the amount of urine -unusually high or low blood pressure -unusually weak or tired Side effects that usually do not require medical attention (report to your doctor or health care professional if they continue or are bothersome): -change in sex drive or performance -changes in breast size in both males and females -changes in emotions or moods -headache -hot flashes -irritation at site where injected -loss of appetite -skin problems like acne, dry skin -vaginal dryness This list may not describe all possible side effects. Call your doctor for medical advice about side effects. You may report side effects to FDA at 1-800-FDA-1088. Where should I keep my medicine? This drug is given in a hospital or clinic and will not be stored at home. NOTE: This sheet is a summary. It may not cover all possible information. If you have questions about this medicine, talk to your doctor, pharmacist, or health care provider.    2016, Elsevier/Gold Standard. (2013-07-31 11:10:35)  

## 2016-05-21 NOTE — Telephone Encounter (Signed)
NO ENTRY 

## 2016-06-02 ENCOUNTER — Encounter: Payer: Self-pay | Admitting: Registered Nurse

## 2016-06-02 ENCOUNTER — Encounter: Payer: Medicare Other | Attending: Registered Nurse | Admitting: Registered Nurse

## 2016-06-02 VITALS — BP 120/78 | HR 95 | Resp 14

## 2016-06-02 DIAGNOSIS — M546 Pain in thoracic spine: Secondary | ICD-10-CM | POA: Diagnosis present

## 2016-06-02 DIAGNOSIS — M545 Low back pain, unspecified: Secondary | ICD-10-CM

## 2016-06-02 DIAGNOSIS — Z5181 Encounter for therapeutic drug level monitoring: Secondary | ICD-10-CM

## 2016-06-02 DIAGNOSIS — M7918 Myalgia, other site: Secondary | ICD-10-CM

## 2016-06-02 DIAGNOSIS — M791 Myalgia: Secondary | ICD-10-CM

## 2016-06-02 DIAGNOSIS — I639 Cerebral infarction, unspecified: Secondary | ICD-10-CM | POA: Diagnosis not present

## 2016-06-02 DIAGNOSIS — I890 Lymphedema, not elsewhere classified: Secondary | ICD-10-CM | POA: Diagnosis present

## 2016-06-02 DIAGNOSIS — Z79899 Other long term (current) drug therapy: Secondary | ICD-10-CM

## 2016-06-02 DIAGNOSIS — G243 Spasmodic torticollis: Secondary | ICD-10-CM | POA: Diagnosis not present

## 2016-06-02 DIAGNOSIS — G894 Chronic pain syndrome: Secondary | ICD-10-CM

## 2016-06-02 MED ORDER — OXYCODONE HCL 5 MG PO TABS: 5.0000 mg | ORAL_TABLET | Freq: Four times a day (QID) | ORAL | 0 refills | Status: DC | PRN

## 2016-06-02 NOTE — Progress Notes (Signed)
Subjective:    Patient ID: Karen Hopkins, female    DOB: July 27, 1975, 40 y.o.   MRN: [id number]  HPI: [name] is a 40year old female who returns for follow up appointmentfor chronic pain and medication refill. She states her pain is located in her neck radiating into her right shoulder and lower back pain.She rates her pain 7. Her current exercise regime is walking and performing stretching exercises. [name] was encouraged to increase activity as tolerated, she verbalizes understanding.   Pain Inventory Average Pain 7 Pain Right Now 7 My pain is sharp, burning, dull, stabbing, tingling and aching  In the last 24 hours, has pain interfered with the following? General activity 6 Relation with others 6 Enjoyment of life 6 What TIME of day is your pain at its worst? all Sleep (in general) Poor  Pain is worse with: bending, sitting, inactivity, standing and some activites Pain improves with: rest, therapy/exercise, medication, TENS and injections Relief from Meds: 5  Mobility how many minutes can you walk? 10 ability to climb steps?  yes do you drive?  yes  Function disabled: date disabled n/a I need assistance with the following:  "Have home health care"  Neuro/Psych numbness spasms depression anxiety  Prior Studies Any changes since last visit?  no  Physicians involved in your care Any changes since last visit?  no   Family History  Problem Relation Age of Onset  . Hypertension Mother   . Diabetes type II Father   . Prostate cancer Father   . Stroke Neg Hx    Social History   Social History  . Marital status: Single    Spouse name: N/A  . Number of children: 3  . Years of education: 48   Occupational History  . Disability     Social History Main Topics  . Smoking status: Former Smoker    Packs/day: 0.25    Years: 5.00    Quit date: 04/26/2010  . Smokeless tobacco: Never Used  . Alcohol use No  . Drug use: No  . Sexual activity:  Not Currently    Birth control/ protection: Surgical   Other Topics Concern  . None   Social History Narrative   Lives with kids   Caffeine use: none    Past Surgical History:  Procedure Laterality Date  . BREAST REDUCTION SURGERY Left 08/23/2013   Procedure: LEFT BREAST REDUCTION  ;  Surgeon: Theodoro Kos, DO;  Location: [name];  Service: Plastics;  Laterality: Left;  . CESAREAN SECTION  07/15/2001; 03/18/2004; 11/21/2008  . LATISSIMUS FLAP TO BREAST Right 03/12/2013   Procedure: RIGHT BREAST LATISSIMUS FLAP WITH EXPANDER PLACEMENT;  Surgeon: Theodoro Kos, DO;  Location: [facility];  Service: Plastics;  Laterality: Right;  . LIPOSUCTION Bilateral 08/23/2013   Procedure: LIPOSUCTION;  Surgeon: Theodoro Kos, DO;  Location: [facility];  Service: Plastics;  Laterality: Bilateral;  . MODIFIED RADICAL MASTECTOMY Right 03/11/2010  . NASAL SEPTUM SURGERY    . PORT-A-CATH REMOVAL Left 12/01/2010  . PORTACATH PLACEMENT Left 09/12/2009  . REMOVAL OF TISSUE EXPANDER AND PLACEMENT OF IMPLANT Right 08/23/2013   Procedure: REMOVAL RIGHT TISSUE EXPANDER AND PLACEMENT OF IMPLANT TO RIGHT BREAST ;  Surgeon: Theodoro Kos, DO;  Location: [facility];  Service: Plastics;  Laterality: Right;  . SUPRA-UMBILICAL HERNIA  [id number]  . TUBAL LIGATION  11/21/2008   Past Medical History:  Diagnosis Date  . Anxiety   . Cancer ([place])   .  Depression   . Essential hypertension 11/05/2015  . History of breast cancer 2011   right  . History of chemotherapy 2011  . History of radiation therapy 2011  . Hypertension    under control with med., has been on med. x 2 yr.  . Lymphedema of arm    right; no BP or puncture to right arm  . Seasonal allergies   . Sinus headache    BP 120/78   Pulse 95   Resp 14   SpO2 99%   Opioid Risk Score:   Fall Risk Score:  `1  Depression screen PHQ 2/9  Depression screen [facility] 2/9 08/04/2015 06/25/2015 03/04/2015 01/20/2015 01/07/2015 11/21/2014  10/07/2014  Decreased Interest 0 0 0 0 0 0 0  Down, Depressed, Hopeless 0 0 0 0 0 0 0  PHQ - 2 Score 0 0 0 0 0 0 0  Altered sleeping - - - - - - -  Tired, decreased energy - - - - - - -  Change in appetite - - - - - - -  Feeling bad or failure about yourself  - - - - - - -  Trouble concentrating - - - - - - -  Moving slowly or fidgety/restless - - - - - - -  Suicidal thoughts - - - - - - -  PHQ-9 Score - - - - - - -  Some recent data might be hidden     Review of Systems  Constitutional: Positive for unexpected weight change.  HENT: Negative.   Eyes: Negative.   Respiratory: Negative.   Cardiovascular: Negative.   Gastrointestinal: Negative.   Endocrine: Negative.   Genitourinary: Negative.   Musculoskeletal: Negative.   Skin: Negative.   Allergic/Immunologic: Negative.   Neurological: Negative.   Hematological: Negative.   Psychiatric/Behavioral: Negative.   All other systems reviewed and are negative.      Objective:   Physical Exam  Constitutional: She is oriented to [place], place, and time. She appears well-developed and well-nourished.  Obesity  HENT:  Head: Normocephalic and atraumatic.  Neck: Normal range of motion. Neck supple.  Cervical Paraspinal Tenderness: C-5-C-6 Mainly Right Side  Cardiovascular: Normal rate and regular rhythm.   Pulmonary/Chest: Effort normal and breath sounds normal.  Musculoskeletal:  Normal Muscle Bulk and Muscle Testing Reveals: Upper Extremities: Right: Decreased ROM 45 Degrees and Muscle Strength 4/5 Left: Full ROM and Muscle Strength 5/5 Right AC Joint Tenderness Lumbar Paraspinal Tenderness: L-3-L-5 Lower Extremities: Full ROM and Muscle Strength 5/5 Arises from Table with ease Narrow Based Gait  Neurological: She is alert and oriented to [place], place, and time.  Skin: Skin is warm and dry.  Psychiatric: She has a normal mood and affect.  Nursing note and vitals reviewed.         Assessment & Plan:  1.Myofascial  pain syndrome chronic postoperative, as well as post radiation: S/P Breast Reconstruction Surgery x 2.  Refilled: Oxycodone 5 mg one tablet every 6 hours as needed for severe pain.# 120.Continue HEP. We will continue the opioid monitoring program, this consists of regular clinic visits, examinations, urine drug screen, pill counts as well as use of [place] Controlled Substance Reporting System. 2. Neoplasm Related Pain: Continue Current Medication Regime 3. Obesity: Losing weight continue with Healthy Diet and Exercise Regime.  4. Muscle Spasms: Continue Tizanidine

## 2016-06-15 ENCOUNTER — Ambulatory Visit: Payer: Medicare Other

## 2016-06-15 ENCOUNTER — Other Ambulatory Visit: Payer: Medicare Other

## 2016-06-17 ENCOUNTER — Other Ambulatory Visit: Payer: Self-pay | Admitting: Cardiovascular Disease

## 2016-06-17 NOTE — Telephone Encounter (Signed)
Review for refill. 

## 2016-06-18 NOTE — Telephone Encounter (Signed)
Rx(s) sent to pharmacy electronically.  

## 2016-06-29 ENCOUNTER — Telehealth: Payer: Self-pay | Admitting: *Deleted

## 2016-06-29 ENCOUNTER — Encounter: Payer: Self-pay | Admitting: Oncology

## 2016-06-29 ENCOUNTER — Ambulatory Visit (INDEPENDENT_AMBULATORY_CARE_PROVIDER_SITE_OTHER): Payer: Medicare Other | Admitting: Neurology

## 2016-06-29 VITALS — BP 113/74 | HR 81 | Ht 64.0 in | Wt 243.2 lb

## 2016-06-29 DIAGNOSIS — G43009 Migraine without aura, not intractable, without status migrainosus: Secondary | ICD-10-CM | POA: Diagnosis not present

## 2016-06-29 MED ORDER — TOPIRAMATE 200 MG PO TABS: 200.0000 mg | ORAL_TABLET | Freq: Every day | ORAL | 12 refills | Status: DC

## 2016-06-29 NOTE — Telephone Encounter (Signed)
Form for re- authorization for xanax XR 1 mg completed and faxed to [employer].  Note above is for continuity of therapy.

## 2016-06-29 NOTE — Progress Notes (Signed)
[id number]    Provider:  Dr Jaynee Eagles Referring Provider: [name], MD Primary Care Physician:  Chrisandra Netters, MD   Interval History 06/29/2016; Here for follow up of headaches. The headaches have significantly improved. No side from the Topiramate. She has lost weight which is good. She feels much better. She follows with cardiology. She is feeling very well. No vision changes. Will repeat MRI brain in April. Will increase Topiramate to 200mg  a day.   Interval history: 41 y.o. female here as a referral from Dr. Ardelia Mems for intractable headaches. PMHx of right breast carcinoma, Status post modified radical mastectomy with postoperative radiation therapy In 2014, HTN, diabetes, migraine, depression, anxiety. She has chronic migraines. MRI of the brain showed a focus of FLAIR hyperintensity, right frontal subcortical white matter possibly a nonspecific focus of ischemic demyelination, or possible post treatment effect. Also seen was an Empty sella.She is on ASA 81mg  for stroke prevention. She went for surgery and saw neuro-ophthalmology at [facility] for enlarged lacrimal glands, biopsy was performed and biopsy was unrevealing and steroid injection swere placed into both eyes. She saw cardiology for evaluation after echocardiogram. She is being managed by cardiology for her HTN. Her migraines are improved she still have some headaches around the eyes. No vision changes, no blurry vision, no hearing changes, no side effects to the Topiramate. She has 3 boys no more children.   Extensive lab workup including the following was unremarkable: hiv, rpr, sickle cell, c3/c4 and complement(slughtly elevated), pan-anca, TSH, hemoglobinopathy,  cardiolipin antibodies, prothrombin gene, fV leiden, homocysteine, b2-glycoprotein, lupus, protein c, antithrombin c, ldl (55), ace  Protein s activity and total were slightly elevated. Sed rate slightly elevated. Hgba1c 6.4.   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   Ultrasound carotids normal.   Mri brain also with partially empty sella could be intracranial hypertension  Cardiac event monitor normal.   Repeat mri orbits 11/2015:   FINDINGS:  Periorbital soft tissues: Prominent bilateral lacrimal glands which measure upper limits of normal versus mildly enlarged. No focal mass lesion is seen within either lacrimal gland. Globes: Slightly misshapen and elongated appearance of the globes bilaterally. Optic nerves: Normal size, contour, and signal bilaterally. Extraocular muscles: Symmetrical, normal size and signal. Contrast: No abnormal enhancement to suggest mass or infection.  Addendum: Echo cardiogram read: "- Global LV longitudinal strain -16.2% is mildly reduced and lower than on the previous study (-20%)". Discussed with Dr. Orene Desanctis, will refer to Dr. Haroldine Laws in Cardiology thanks.  [id number] D Personis a 41 y.o.femalehere as a referral from Dr. Rayburn Go intractable headaches. PMHx of right breast carcinoma, Status post modified radical mastectomy with postoperative radiation therapy In 2014, HTN, diabetes, migraine, depression, anxiety. She has had headaches for years. Since she was a teenager. Has to go into a dark room. The headache is worse with laying down when it is very bad. She has to sit very still. She wakes up with the headaches. Not taking tylenol, advil or naproxen daily no overuse headache. Headache starts on the right around the eye, throbbing, pulsing, and spreads to the other side of the head. She has nausea, no vomiting. She has photophobia, phonophobia. She wakes up with headaches. Snores a lot, kids tell her she excessively snores. She is very tired during the day. She also has cervical dystonia and just received botox. She has a headache several times a week  and can last up to 3 days  each. At least 15-20 headaches a month and at least 8 are migrainous. This frequency for over a year. Can be severe 10/10 pain. She can be excessively tired during the week but she had a sleep test for OSA neg 06/23/2011.  Reviewed notes, labs and imaging from outside physicians, which showed:   FINDINGS: No evidence for acute infarction, hemorrhage, mass lesion, hydrocephalus, or extra-axial fluid. Normal cerebral volume.  Subcentimeter focus of FLAIR hyperintensity, RIGHT frontal subcortical white matter, image 16, favored to represent nonspecific focus of ischemic demyelination, or possible post treatment effect. Empty sella. No tonsillar herniation. No osseous lesion. Flow voids are maintained. No chronic hemorrhage.  Post infusion, no abnormal enhancement of brain or meninges.Extracranial soft tissues unremarkable. Shotty cervical adenopathy.  Compared with priors, it is possible that the RIGHT frontal subcortical lesion can be faintly visualized in retrospect. Certainly there is no acute abnormality in that region in 2014.  IMPRESSION: No evidence for metastatic disease to the brain or surrounding structures.  Subcentimeter focus of FLAIR hyperintensity RIGHT frontal subcortical white matter, nonspecific, see discussion above. No evidence for enhancement or restricted diffusion to suggest acuity. Suspect chronic focus of ischemic demyelination or post treatment effect.  CMP unremarkable 04/10/2015  Review of Systems: Patient complains of symptoms per HPI as well as the following symptoms: Constipation, memory loss, numbness, joint pain, aching muscles, walking difficulty, neck pain, neck stiffness, depression, anxious. Pertinent negatives per HPI. All others negative.  Social History   Social History  . Marital status: Single    Spouse name: N/A  . Number of children: 3  . Years of education: 50   Occupational History  . Disability     Social History Main Topics  .  Smoking status: Former Smoker    Packs/day: 0.25    Years: 5.00    Quit date: 04/26/2010  . Smokeless tobacco: Never Used  . Alcohol use No  . Drug use: No  . Sexual activity: Not Currently    Birth control/ protection: Surgical   Other Topics Concern  . Not on file   Social History Narrative   Lives with kids   Caffeine use: none     Family History  Problem Relation Age of Onset  . Hypertension Mother   . Diabetes type II Father   . Prostate cancer Father   . Stroke Neg Hx     Past Medical History:  Diagnosis Date  . Anxiety   . Cancer ([place])   . Depression   . Essential hypertension 11/05/2015  . History of breast cancer 2011   right  . History of chemotherapy 2011  . History of radiation therapy 2011  . Hypertension    under control with med., has been on med. x 2 yr.  . Lymphedema of arm    right; no BP or puncture to right arm  . Seasonal allergies   . Sinus headache     Past Surgical History:  Procedure Laterality Date  . BREAST REDUCTION SURGERY Left 08/23/2013   Procedure: LEFT BREAST REDUCTION  ;  Surgeon: Theodoro Kos, DO;  Location: [facility];  Service: Plastics;  Laterality: Left;  . CESAREAN SECTION  07/15/2001; 03/18/2004; 11/21/2008  . LATISSIMUS FLAP TO BREAST Right 03/12/2013   Procedure: RIGHT BREAST LATISSIMUS FLAP WITH EXPANDER PLACEMENT;  Surgeon: Theodoro Kos, DO;  Location: [facility];  Service: Plastics;  Laterality: Right;  . LIPOSUCTION Bilateral 08/23/2013   Procedure: LIPOSUCTION;  Surgeon: Theodoro Kos, DO;  Location: [facility];  Service: Plastics;  Laterality: Bilateral;  . MODIFIED RADICAL MASTECTOMY Right 03/11/2010  . NASAL SEPTUM SURGERY    . PORT-A-CATH REMOVAL Left 12/01/2010  . PORTACATH PLACEMENT Left 09/12/2009  . REMOVAL OF TISSUE EXPANDER AND PLACEMENT OF IMPLANT Right 08/23/2013   Procedure: REMOVAL RIGHT TISSUE EXPANDER AND PLACEMENT OF IMPLANT TO RIGHT BREAST ;  Surgeon: Theodoro Kos, DO;   Location: [facility];  Service: Plastics;  Laterality: Right;  . SUPRA-UMBILICAL HERNIA  [id number]  . TUBAL LIGATION  11/21/2008    Current Outpatient Prescriptions  Medication Sig Dispense Refill  . ALPRAZolam (ALPRAZOLAM XR) 1 MG 24 hr tablet Take 1 tablet (1 mg total) by mouth daily. 30 tablet 2  . anastrozole (ARIMIDEX) 1 MG tablet Take 1 tablet (1 mg total) by mouth daily. 90 tablet 4  . carvedilol (COREG) 6.25 MG tablet Take 1 tablet (6.25 mg total) by mouth 2 (two) times daily with a meal. 60 tablet 5  . DULoxetine (CYMBALTA) 30 MG capsule Take 90 mg by mouth daily.     . furosemide (LASIX) 40 MG tablet Take 1 tablet (40 mg total) by mouth daily. 90 tablet 3  . goserelin (ZOLADEX) 3.6 MG injection Inject 3.6 mg into the skin every 28 (twenty-eight) days.    Marland Kitchen losartan (COZAAR) 50 MG tablet Take 1 tablet (50 mg total) by mouth daily. PLEASE CONTACT OFFICE FOR ADDITIONAL  2ND ATTEMPT 15 tablet 0  . metFORMIN (GLUCOPHAGE) 500 MG tablet TAKE ONE (1) TABLET BY MOUTH TWO (2) TIMES DAILY WITH A MEAL 60 tablet 5  . metoCLOPramide (REGLAN) 10 MG tablet Take 1 tablet (10 mg total) by mouth every 8 (eight) hours as needed. 60 tablet 1  . oxyCODONE (OXY IR/ROXICODONE) 5 MG immediate release tablet Take 1 tablet (5 mg total) by mouth every 6 (six) hours as needed for severe pain. 120 tablet 0  . polyethylene glycol powder (GLYCOLAX) powder Take 17 g by mouth daily. 500 g 5  . potassium chloride (K-DUR) 10 MEQ tablet Take 2 tablets (20 mEq total) by mouth 3 (three) times daily. 180 tablet 1  . tiZANidine (ZANAFLEX) 4 MG tablet TAKE ONE (1) TABLET BY MOUTH 3 TIMES DAILY 90 tablet 3  . topiramate (TOPAMAX) 50 MG tablet Take 3 tablets (150 mg total) by mouth at bedtime. 90 tablet 12  . traZODone (DESYREL) 100 MG tablet      No current facility-administered medications for this visit.     Allergies as of 06/29/2016 - Review Complete 06/29/2016  Allergen Reaction Noted  . Penicillins  Swelling 04/14/2010  . Lyrica [pregabalin] Nausea Only 01/19/2012  . Meloxicam Nausea Only 12/22/2011  . Robaxin [methocarbamol] Nausea Only 12/22/2011    Vitals: BP 113/74 (BP Location: Left Arm, Patient Position: Sitting, Cuff Size: Large)   Pulse 81   Ht 5\' 4"  (1.626 m)   Wt 243 lb 3.2 oz (110.3 kg)   BMI 41.75 kg/m  Last Weight:  Wt Readings from Last 1 Encounters:  06/29/16 243 lb 3.2 oz (110.3 kg)   Last Height:   Ht Readings from Last 1 Encounters:  06/29/16 5\' 4"  (1.626 m)     Physical exam: Exam: Gen: NAD, conversant, well nourised, morbidly obese, well groomed  CV: RRR, no MRG. No Carotid Bruits. No peripheral edema, warm, nontender Eyes: Conjunctivae clear without exudates or hemorrhage  Neuro: Detailed Neurologic Exam  Speech: Speech is normal; fluent and spontaneous with  normal comprehension.  Cognition: The patient is oriented to [place], place, and time;  recent and remote memory intact;  language fluent;  normal attention, concentration,  fund of knowledge Cranial Nerves: The pupils are equal, round, and reactive to light. The fundi are normal. Visual fields are full to finger confrontation. Extraocular movements are intact. Trigeminal sensation is intact and the muscles of mastication are normal. The face is symmetric. The palate elevates in the midline. Hearing intact. Voice is normal. Shoulder shrug is normal. The tongue has normal motion without fasciculations.   Coordination: Normal finger to nose and heel to shin. Normal rapid alternating movements.   Gait: Heel-toe and tandem gait are normal.   Motor Observation: No asymmetry, no atrophy, and no involuntary movements noted. Tone: Normal muscle tone.   Posture: Posture is normal. normal erect  Strength: Strength is V/V in the upper and lower limbs.   Sensation: intact to LT  Reflex Exam:  DTR's: Deep tendon  reflexes in the upper and lower extremities are normal bilaterally.  Toes: The toes are downgoing bilaterally.  Clonus: Clonus is absent.   Assessment/Plan:41 y.o. female here as a referral from Dr. Ardelia Mems for intractable headaches. PMHx of right breast carcinoma, Status post modified radical mastectomy with postoperative radiation therapy In 2014, HTN, diabetes, migraine, depression, anxiety. She was referred for chronic migraines. MRI of the brain showed a focus of FLAIR hyperintensity, right frontal subcortical white matter possibly a nonspecific focus of ischemic demyelination, or possible post treatment effect. Also seen was an Empty sella and enlarged lacrimal glands biopsy was negative for sarcoid. Repeat MRI brain and orbits in April.  additional workup for evaluation of any risk factors for ischemic stroke due to subcortical t2 Extensive lab workup including the following was unremarkable: hiv, rpr, sickle cell, c3/c4 and complement(slughtly elevated), pan-anca, TSH, hemoglobinopathy,  cardiolipin antibodies, prothrombin gene, fV leiden, homocysteine, b2-glycoprotein, lupus, protein c, antithrombin c, ldl (55), ace.   Protein s activity and total were slightly elevated. Sed rate slightly elevated. Hgba1c 6.4.   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 Steroid injections performed. Biopsy unremarkable, rules out sarcoid and other etiologies. Needs repeat imaging in April 2018 to follow.  Ultrasound carotids normal.   Cardiac event monitor normal.  Continue Daily asa 81mg  Follow closely with pcp for management of vascular risk factors ( goals ldl <70, normotensive BP, hgba1c < 6.5).  Echo cardiogram read: "- Global LV longitudinal strain -16.2% is mildly reduced and lower than on the previous study (-20%)". Discussed with Dr. Orene Desanctis, Referred to Dr. Haroldine Laws thanks.  Continue cardiology follow up.  Increase Topiramate to 200mg  at night for migraines.   Sarina Ill, MD  [facility] [address] [place], [place] [address]  Phone [phone] Fax [phone]  A total of 30 minutes was spent face-to-face with this patient. Over half this time was spent on counseling patient on the migraine, enlarged lacrimal glands diagnosis and different diagnostic and therapeutic options available.

## 2016-06-29 NOTE — Patient Instructions (Signed)
Remember to drink plenty of fluid, eat healthy meals and do not skip any meals. Try to eat protein with a every meal and eat a healthy snack such as fruit or nuts in between meals. Try to keep a regular sleep-wake schedule and try to exercise daily, particularly in the form of walking, 20-30 minutes a day, if you can.   As far as your medications are concerned, I would like to suggest:  Increase Topiramate to 200mg    As far as diagnostic testing: Repeat imaging in April   I would like to see you back in 4-5 months, sooner if we need to. Please call us with any interim questions, concerns, problems, updates or refill requests.   Our phone number is [phone]. We also have an after hours call service for urgent matters and there is a physician on-call for urgent questions. For any emergencies you know to call 911 or go to the nearest emergency room

## 2016-06-29 NOTE — Progress Notes (Signed)
Submitted request for auth for Alprazolam XR today.

## 2016-06-30 ENCOUNTER — Encounter: Payer: Self-pay | Admitting: Neurology

## 2016-06-30 NOTE — Telephone Encounter (Signed)
[employer] faxed decision for Alprazolam XR 1 mg tablets.  Medicine is approved through 06-06-2017.

## 2016-07-05 ENCOUNTER — Encounter: Payer: Medicare Other | Attending: Registered Nurse

## 2016-07-05 ENCOUNTER — Encounter: Payer: Self-pay | Admitting: Physical Medicine & Rehabilitation

## 2016-07-05 ENCOUNTER — Ambulatory Visit (HOSPITAL_BASED_OUTPATIENT_CLINIC_OR_DEPARTMENT_OTHER): Payer: Medicare Other | Admitting: Physical Medicine & Rehabilitation

## 2016-07-05 VITALS — BP 120/88 | HR 106

## 2016-07-05 DIAGNOSIS — G243 Spasmodic torticollis: Secondary | ICD-10-CM

## 2016-07-05 DIAGNOSIS — I890 Lymphedema, not elsewhere classified: Secondary | ICD-10-CM | POA: Diagnosis not present

## 2016-07-05 DIAGNOSIS — M546 Pain in thoracic spine: Secondary | ICD-10-CM | POA: Diagnosis not present

## 2016-07-05 MED ORDER — OXYCODONE HCL 5 MG PO TABS: 5.0000 mg | ORAL_TABLET | Freq: Four times a day (QID) | ORAL | 0 refills | Status: DC | PRN

## 2016-07-05 NOTE — Patient Instructions (Signed)

## 2016-07-05 NOTE — Progress Notes (Signed)
Botulinum toxin injection for cervical dystonia CPT code [phone] Diagnosis code G 24.3 Indication is cervical dystonia that has not responded to conservative care and interferes with activities of daily living as well as cervical range of motion. Chronic cervical pain not relieved by other treatments.  Informed consent was obtained after describing risks and benefits of the procedure with the patient this included bleeding bruising and infection The patient elects to proceed and has given Written consent.  REMS form completed  Patient placed in a seated position A 27-gauge 1 inch needle electrode was used to guide the injection under EMG guidance.  Muscles and dosing: Right Trapezius, 50 Right levator, 75 units Right sternocleidomastoid, 37.5 units Right platysma 12.5 units Right scalene 25 units  All injections done after negative drawback for blood. Patient tolerated procedure well. Post procedure instructions given. Follow up appointment made

## 2016-07-08 ENCOUNTER — Encounter: Payer: Self-pay | Admitting: Family Medicine

## 2016-07-08 ENCOUNTER — Ambulatory Visit (INDEPENDENT_AMBULATORY_CARE_PROVIDER_SITE_OTHER): Payer: Medicare Other | Admitting: Family Medicine

## 2016-07-08 VITALS — BP 110/60 | HR 91 | Temp 98.2°F | Ht 64.0 in | Wt 241.2 lb

## 2016-07-08 DIAGNOSIS — E119 Type 2 diabetes mellitus without complications: Secondary | ICD-10-CM | POA: Diagnosis not present

## 2016-07-08 DIAGNOSIS — Z23 Encounter for immunization: Secondary | ICD-10-CM | POA: Diagnosis not present

## 2016-07-08 DIAGNOSIS — J209 Acute bronchitis, unspecified: Secondary | ICD-10-CM

## 2016-07-08 LAB — POCT GLYCOSYLATED HEMOGLOBIN (HGB A1C): HEMOGLOBIN A1C: 5.6

## 2016-07-08 MED ORDER — PREDNISONE 20 MG PO TABS: 40.0000 mg | ORAL_TABLET | Freq: Every day | ORAL | 0 refills | Status: DC

## 2016-07-08 MED ORDER — AZITHROMYCIN 500 MG PO TABS: 500.0000 mg | ORAL_TABLET | Freq: Every day | ORAL | 0 refills | Status: AC

## 2016-07-08 NOTE — Patient Instructions (Signed)
Your diabetes is under very good control You have bronchitis.  I sent two prescriptions, an antibiotic and some prednisone for the wheezing.  Both are for five days.  Make an appointment for Dr. Ardelia Mems.  You are behind on several things, including a pneumonia vaccine.   The nurse will give you a flu shot today.

## 2016-07-09 NOTE — Assessment & Plan Note (Addendum)
Really a picture of asthmatic bronchitis.  Could she be developing adult onset asthma?  Will treat with pred and azithro due to impressive lung exam.  Also flu shot today since she is afebrile.

## 2016-07-09 NOTE — Assessment & Plan Note (Signed)
Great control.  No hypoglycemia.  Cont metformin.

## 2016-07-09 NOTE — Progress Notes (Signed)
   Subjective:    Patient ID: Karen Hopkins, female    DOB: 09-15-1975, 41 y.o.   MRN: [id number]  HPI  4 day hx of cough, shortness of breath and wheezing.  Non smoker.  No hx of asthma, specifically denies childhood asthma.  No fever.  Cough is non productive.  Has been diagnosed with acute bronchitis based on a similar presentation.  No myalgias, headache or rhinnorhea.  She has a host of chronic medical problems.  None mark her as immunocompromised.  She is a breast cancer survivor.  Did have breast radiation.  CXR done 2 years post radiation was clear: I.e. No radiation pneumonitis.  Due for A1C.  No hypoglycemic symptoms.  Review of Systems     Objective:   Physical Exam VS reviewed.   A1C is great. No tachypnea or retractions.   Lungs coarse rhonchi and diffuse exp wheeze throughout.        Assessment & Plan:

## 2016-07-13 ENCOUNTER — Other Ambulatory Visit (HOSPITAL_BASED_OUTPATIENT_CLINIC_OR_DEPARTMENT_OTHER): Payer: Medicare Other

## 2016-07-13 ENCOUNTER — Ambulatory Visit (HOSPITAL_BASED_OUTPATIENT_CLINIC_OR_DEPARTMENT_OTHER): Payer: Medicare Other

## 2016-07-13 VITALS — BP 119/94 | HR 86 | Temp 97.5°F | Resp 20

## 2016-07-13 DIAGNOSIS — Z5111 Encounter for antineoplastic chemotherapy: Secondary | ICD-10-CM

## 2016-07-13 DIAGNOSIS — C50811 Malignant neoplasm of overlapping sites of right female breast: Secondary | ICD-10-CM | POA: Diagnosis not present

## 2016-07-13 DIAGNOSIS — C50911 Malignant neoplasm of unspecified site of right female breast: Secondary | ICD-10-CM

## 2016-07-13 LAB — COMPREHENSIVE METABOLIC PANEL
ALBUMIN: 3.9 g/dL (ref 3.5–5.0)
ALK PHOS: 101 U/L (ref 40–150)
ALT: 17 U/L (ref 0–55)
AST: 10 U/L (ref 5–34)
Anion Gap: 9 mEq/L (ref 3–11)
BILIRUBIN TOTAL: 0.32 mg/dL (ref 0.20–1.20)
BUN: 10.1 mg/dL (ref 7.0–26.0)
CALCIUM: 9.6 mg/dL (ref 8.4–10.4)
CO2: 23 mEq/L (ref 22–29)
Chloride: 107 mEq/L (ref 98–109)
Creatinine: 0.8 mg/dL (ref 0.6–1.1)
EGFR: >90 mL/min/{1.73_m2} (ref >90)
GLUCOSE: 111 mg/dL (ref 70–140)
Potassium: 3.1 mEq/L — ABNORMAL LOW (ref 3.5–5.1)
SODIUM: 139 meq/L (ref 136–145)
TOTAL PROTEIN: 8 g/dL (ref 6.4–8.3)

## 2016-07-13 LAB — CBC WITH DIFFERENTIAL/PLATELET
BASO%: 0.2 % (ref 0.0–2.0)
BASOS ABS: 0 10*3/uL (ref 0.0–0.1)
EOS ABS: 0.2 10*3/uL (ref 0.0–0.5)
EOS%: 1.4 % (ref 0.0–7.0)
HEMATOCRIT: 40.2 % (ref 34.8–46.6)
HGB: 13.2 g/dL (ref 11.6–15.9)
LYMPH#: 5.3 10*3/uL — AB (ref 0.9–3.3)
LYMPH%: 46 % (ref 14.0–49.7)
MCH: 27.6 pg (ref 25.1–34.0)
MCHC: 32.7 g/dL (ref 31.5–36.0)
MCV: 84.3 fL (ref 79.5–101.0)
MONO#: 0.6 10*3/uL (ref 0.1–0.9)
MONO%: 5.1 % (ref 0.0–14.0)
NEUT%: 47.3 % (ref 38.4–76.8)
NEUTROS ABS: 5.5 10*3/uL (ref 1.5–6.5)
Platelets: 309 10*3/uL (ref 145–400)
RBC: 4.77 10*6/uL (ref 3.70–5.45)
RDW: 15.3 % — AB (ref 11.2–14.5)
WBC: 11.6 10*3/uL — AB (ref 3.9–10.3)

## 2016-07-13 MED ORDER — GOSERELIN ACETATE 3.6 MG ~~LOC~~ IMPL
3.6000 mg | DRUG_IMPLANT | Freq: Once | SUBCUTANEOUS | Status: AC
  Administered 2016-07-13: 3.6 mg via SUBCUTANEOUS
  Filled 2016-07-13: qty 3.6

## 2016-07-13 NOTE — Patient Instructions (Signed)
Goserelin injection What is this medicine? GOSERELIN (GOE se rel in) is similar to a hormone found in the body. It lowers the amount of sex hormones that the body makes. Men will have lower testosterone levels and women will have lower estrogen levels while taking this medicine. In men, this medicine is used to treat prostate cancer; the injection is either given once per month or once every 12 weeks. A once per month injection (only) is used to treat women with endometriosis, dysfunctional uterine bleeding, or advanced breast cancer. This medicine may be used for other purposes; ask your health care provider or pharmacist if you have questions. What should I tell my health care provider before I take this medicine? They need to know if you have any of these conditions (some only apply to women): -diabetes -heart disease or previous heart attack -high blood pressure -high cholesterol -kidney disease -osteoporosis or low bone density -problems passing urine -spinal cord injury -stroke -tobacco smoker -an unusual or allergic reaction to goserelin, hormone therapy, other medicines, foods, dyes, or preservatives -pregnant or trying to get pregnant -breast-feeding How should I use this medicine? This medicine is for injection under the skin. It is given by a health care professional in a hospital or clinic setting. Men receive this injection once every 4 weeks or once every 12 weeks. Women will only receive the once every 4 weeks injection. Talk to your pediatrician regarding the use of this medicine in children. Special care may be needed. Overdosage: If you think you have taken too much of this medicine contact a poison control center or emergency room at once. NOTE: This medicine is only for you. Do not share this medicine with others. What if I miss a dose? It is important not to miss your dose. Call your doctor or health care professional if you are unable to keep an appointment. What may  interact with this medicine? -female hormones like estrogen -herbal or dietary supplements like black cohosh, chasteberry, or DHEA -female hormones like testosterone -prasterone This list may not describe all possible interactions. Give your health care provider a list of all the medicines, herbs, non-prescription drugs, or dietary supplements you use. Also tell them if you smoke, drink alcohol, or use illegal drugs. Some items may interact with your medicine. What should I watch for while using this medicine? Visit your doctor or health care professional for regular checks on your progress. Your symptoms may appear to get worse during the first weeks of this therapy. Tell your doctor or healthcare professional if your symptoms do not start to get better or if they get worse after this time. Your bones may get weaker if you take this medicine for a long time. If you smoke or frequently drink alcohol you may increase your risk of bone loss. A family history of osteoporosis, chronic use of drugs for seizures (convulsions), or corticosteroids can also increase your risk of bone loss. Talk to your doctor about how to keep your bones strong. This medicine should stop regular monthly menstration in women. Tell your doctor if you continue to menstrate. Women should not become pregnant while taking this medicine or for 12 weeks after stopping this medicine. Women should inform their doctor if they wish to become pregnant or think they might be pregnant. There is a potential for serious side effects to an unborn child. Talk to your health care professional or pharmacist for more information. Do not breast-feed an infant while taking this medicine. Men should   inform their doctors if they wish to father a child. This medicine may lower sperm counts. Talk to your health care professional or pharmacist for more information. What side effects may I notice from receiving this medicine? Side effects that you should  report to your doctor or health care professional as soon as possible: -allergic reactions like skin rash, itching or hives, swelling of the face, lips, or tongue -bone pain -breathing problems -changes in vision -chest pain -feeling faint or lightheaded, falls -fever, chills -pain, swelling, warmth in the leg -pain, tingling, numbness in the hands or feet -signs and symptoms of low blood pressure like dizziness; feeling faint or lightheaded, falls; unusually weak or tired -stomach pain -swelling of the ankles, feet, hands -trouble passing urine or change in the amount of urine -unusually high or low blood pressure -unusually weak or tired Side effects that usually do not require medical attention (report to your doctor or health care professional if they continue or are bothersome): -change in sex drive or performance -changes in breast size in both males and females -changes in emotions or moods -headache -hot flashes -irritation at site where injected -loss of appetite -skin problems like acne, dry skin -vaginal dryness This list may not describe all possible side effects. Call your doctor for medical advice about side effects. You may report side effects to FDA at 1-800-FDA-1088. Where should I keep my medicine? This drug is given in a hospital or clinic and will not be stored at home. NOTE: This sheet is a summary. It may not cover all possible information. If you have questions about this medicine, talk to your doctor, pharmacist, or health care provider.    2016, Elsevier/Gold Standard. (2013-07-31 11:10:35)  

## 2016-07-19 ENCOUNTER — Other Ambulatory Visit: Payer: Self-pay | Admitting: Cardiovascular Disease

## 2016-07-19 NOTE — Telephone Encounter (Signed)
Refill request

## 2016-07-20 ENCOUNTER — Ambulatory Visit (INDEPENDENT_AMBULATORY_CARE_PROVIDER_SITE_OTHER): Payer: Medicare Other | Admitting: Family Medicine

## 2016-07-20 ENCOUNTER — Encounter: Payer: Self-pay | Admitting: Family Medicine

## 2016-07-20 VITALS — BP 80/52 | HR 96 | Temp 97.6°F | Ht 64.0 in | Wt 242.6 lb

## 2016-07-20 DIAGNOSIS — E119 Type 2 diabetes mellitus without complications: Secondary | ICD-10-CM | POA: Diagnosis not present

## 2016-07-20 DIAGNOSIS — J209 Acute bronchitis, unspecified: Secondary | ICD-10-CM | POA: Diagnosis not present

## 2016-07-20 DIAGNOSIS — I10 Essential (primary) hypertension: Secondary | ICD-10-CM

## 2016-07-20 DIAGNOSIS — Z23 Encounter for immunization: Secondary | ICD-10-CM | POA: Diagnosis not present

## 2016-07-20 MED ORDER — ALBUTEROL SULFATE 108 (90 BASE) MCG/ACT IN AEPB: 2.0000 | INHALATION_SPRAY | Freq: Four times a day (QID) | RESPIRATORY_TRACT | 1 refills | Status: DC | PRN

## 2016-07-20 MED ORDER — TETANUS-DIPHTH-ACELL PERTUSSIS 5-2.5-18.5 LF-MCG/0.5 IM SUSP: 0.5000 mL | Freq: Once | INTRAMUSCULAR | 0 refills | Status: AC

## 2016-07-20 NOTE — Progress Notes (Signed)
Date of Visit: 07/20/2016   HPI:  Patient presents for follow up, as she was recently told she is behind on some health maintenance items including pneumovax.  Diabetes - currently taking metformin 500mg  twice daily. Tolerating this well. Due for eye & foot exam.   Hypertension - currently on losartan 50mg  daily and coreg 6.25mg  twice daily (some of which I believe is due to echo showing global longitudinal strain). Blood pressure notably low today. Denies chest pain or shortness of breath. Followed by Dr. Oval Linsey of cardiology. No lightheadedness or dizziness.  F/u bronchitis - seen by Dr. Andria Frames with bronchitis recently, prescribed azithromycin and prednisone. Bronchitis is much better now. Does occasionally stillt hink she wheezes. Used to have an albuterol inhaler, but not anymore. Would like one.   ROS: See HPI.  [place]: history of obesity, type 2 diabetes, history of breast cancer, hypertension   PHYSICAL EXAM: BP (!) 80/52   Pulse 96   Temp 97.6 F (36.4 C) (Oral)   Ht 5\' 4"  (1.626 m)   Wt 242 lb 9.6 oz (110 kg)   SpO2 100%   BMI 41.64 kg/m  Gen: NAD, pleasant, cooperative HEENT: normocephalic, atraumatic, mmm Heart:  Regular rate and rhythm, no murmur Lungs: clear to auscultation bilaterally normal work of breathing  Neuro: alert grossly nonfocal speech normal Ext: No appreciable lower extremity edema bilaterally  Diabetic foot exam: 2+ DP pulse on R. L DP pulse difficult to palpate though audible on doppler, normal monofilament testing bilaterally. No lesions or significant calluses.    ASSESSMENT/PLAN:  Health maintenance:  -instructed to get Tdap at her pharmacy - given printed rx for it -pneumovax done today -foot exam done today (note pulse less palpable on left foot though audible with doppler) - will reassess when blood pressure is improved -recommended she schedule eye appointment   Essential hypertension Notably hypotensive today, though patient  asymptomatic from this Will stop losartan Follow up in 2 days for blood pressure check & BMET to assess electrolytes off losartan Advised to follow up with cardiologist  Type 2 diabetes mellitus without complication ([place]) Recent A1c 5.6 - well controlled  Cardiac: lipids well controlled in April 2017, not on statin presently Renal: on ARB (though stopping this today due to low blood pressure, consider restarting in the future) Eye: advised to get exam Foot: exam done today (note pulse less palpable on left foot though audible with doppler) - will reassess when blood pressure is improved Immunizations: advised to get Tdap. Pneumovax done today.   Acute bronchitis Improved. rx for albuterol inhaler so she has one on hand.  FOLLOW UP: Follow up in 2 days for blood pressure recheck See me in a few weeks Schedule follow up with cardiology  [place] J. Ardelia Mems, [place]

## 2016-07-20 NOTE — Patient Instructions (Addendum)
Stop the losartan Follow up in 2 days to recheck blood pressure and get labwork - schedule with any doctor here at [facility]   Take tetanus shot prescription to your pharmacy Pneumonia shot today  Sent in albuterol inhaler for you to have in case you need it  See me in a few weeks to follow up   Schedule follow up with your cardiologist as well  Be well, Dr. Ardelia Mems

## 2016-07-20 NOTE — Telephone Encounter (Signed)
Rx(s) sent to pharmacy electronically.  

## 2016-07-22 ENCOUNTER — Ambulatory Visit (INDEPENDENT_AMBULATORY_CARE_PROVIDER_SITE_OTHER): Payer: Medicare Other | Admitting: Family Medicine

## 2016-07-22 ENCOUNTER — Ambulatory Visit: Payer: Medicare Other | Admitting: Cardiovascular Disease

## 2016-07-22 ENCOUNTER — Telehealth: Payer: Self-pay | Admitting: Cardiovascular Disease

## 2016-07-22 ENCOUNTER — Encounter: Payer: Self-pay | Admitting: Family Medicine

## 2016-07-22 ENCOUNTER — Other Ambulatory Visit: Payer: Self-pay | Admitting: Family Medicine

## 2016-07-22 VITALS — BP 114/72 | HR 98 | Temp 98.6°F | Ht 64.0 in | Wt 242.4 lb

## 2016-07-22 DIAGNOSIS — R69 Illness, unspecified: Secondary | ICD-10-CM

## 2016-07-22 DIAGNOSIS — I952 Hypotension due to drugs: Secondary | ICD-10-CM

## 2016-07-22 NOTE — Telephone Encounter (Signed)
New Message     Pt c/o BP issue: STAT if pt c/o blurred vision, one-sided weakness or slurred speech  1. What are your last 5 BP readings? Per pt in file  2. Are you having any other symptoms (ex. Dizziness, headache, blurred vision, passed out)? Per pt in file  3. What is your BP issue? Per pt BP was running low, and primary physician took her off her Ahmed Prima (she thinks this was the med) Wanted to let Dr. Oval Linsey know.

## 2016-07-22 NOTE — Telephone Encounter (Signed)
Per DPR okay to leave a detailed message on [phone]  Reviewed chart, pt seen w hypotensive reading on 2/13 by PCP office, stopped losartan that day, BP improved on her f/u visit today.   Called patient back, goes to [facility]. Left msg advising to stay off the losartan as instructed by PCP, continue to monitor BPs, follow up w Dr. Oval Linsey as scheduled on 2/26. Instructed to call if concerns in interim.

## 2016-07-22 NOTE — Telephone Encounter (Signed)
Dr Oval Linsey aware of changes

## 2016-07-22 NOTE — Patient Instructions (Addendum)
Thank you so much for coming to visit today! Your blood pressure is much better today! Please go to [facility] to have your electrolytes and kidney function rechecked. Please schedule an appointment with your Cardiologist if you are unable to make it today! Please follow up with your PCP.  Dr. Gerlean Ren

## 2016-07-23 NOTE — Assessment & Plan Note (Signed)
Improved. rx for albuterol inhaler so she has one on hand.

## 2016-07-23 NOTE — Assessment & Plan Note (Signed)
Notably hypotensive today, though patient asymptomatic from this Will stop losartan Follow up in 2 days for blood pressure check & BMET to assess electrolytes off losartan Advised to follow up with cardiologist

## 2016-07-23 NOTE — Assessment & Plan Note (Signed)
Recent A1c 5.6 - well controlled  Cardiac: lipids well controlled in April 2017, not on statin presently Renal: on ARB (though stopping this today due to low blood pressure, consider restarting in the future) Eye: advised to get exam Foot: exam done today (note pulse less palpable on left foot though audible with doppler) - will reassess when blood pressure is improved Immunizations: advised to get Tdap. Pneumovax done today.

## 2016-07-24 NOTE — Progress Notes (Signed)
Subjective:     Patient ID: Karen Hopkins, female   DOB: 03/12/1976, 41 y.o.   MRN: [id number]  HPI Karen Hopkins is a 41yo female presenting today for follow up of blood pressure and labs. Last seen on 07/20/16 and was noted to have significantly low blood pressure. Asymptomatic. Orthostatic vital signs revealed lying blood pressure of 94/60, sitting blood pressure of 90/66, and standing blood pressure of 86/54. Losartan was discontinued.  Returns today and is still asymptomatic. Denies dizziness, chest pain, palpitations, and shortness of breath. Reports Cardiology appointment later today, but plans to call and reschedule since it will make her late picking up her children from school.  Former Smoker.  Review of Systems Per HPI    Objective:   Physical Exam  Constitutional: She appears well-developed and well-nourished. No distress.  Cardiovascular: Normal rate and regular rhythm.   No murmur heard. Pulmonary/Chest: Effort normal. No respiratory distress. She has wheezes.  Abdominal: Soft. She exhibits no distension. There is no tenderness.  Musculoskeletal: She exhibits no edema.  Psychiatric: She has a normal mood and affect. Her behavior is normal.      Assessment and Plan:     1. Hypotension due to drugs Improved. Continue to hold Losartan. Will check BMP today. Return if symptoms of dizziness, shortness of breath, chest pain, or palpitations occur. Follow up with Cardiology. Follow up with PCP.

## 2016-07-28 ENCOUNTER — Other Ambulatory Visit: Payer: Self-pay

## 2016-07-28 MED ORDER — TIZANIDINE HCL 4 MG PO TABS: ORAL_TABLET | ORAL | 3 refills | Status: DC

## 2016-08-01 NOTE — Progress Notes (Deleted)
Cardiology Office Note   Date:  08/01/2016   ID:  Karen Hopkins, DOB [birth year], MRN [id number]  PCP:  Karen Netters, MD  Cardiologist:   Karen Latch, MD   No chief complaint on file.   History of Present Illness: Karen Hopkins is a 41 y.o. female with breast cancer s/p mastectomy, chemotherapy (4 cycles of doxorubicin and cyclophosphamide, 12 cycles of paclitaxel and trastuzumab) diabetes who presents for follow-up.  She presented 10/2015 with shortness of breath.  She had an echo that revealed LVEF 55-60% and normal diastolic function. Her global longitudinal strain was -16.2%, which is slightly higher (worse) than her prior, which was -20%.  At that appointment Maxzide was discontinued and she was started on losartan and carvedilol.  She noted lower extremity edema so lasix was increased.   Since her last appointment Ms. Guarisco's BP was running low so losartan was discontinued.   F/u exercise    Past Medical History:  Diagnosis Date  . Anxiety   . Cancer ([place])   . Depression   . Essential hypertension 11/05/2015  . History of breast cancer 2011   right  . History of chemotherapy 2011  . History of radiation therapy 2011  . Hypertension    under control with med., has been on med. x 2 yr.  . Lymphedema of arm    right; no BP or puncture to right arm  . Seasonal allergies   . Sinus headache     Past Surgical History:  Procedure Laterality Date  . BREAST REDUCTION SURGERY Left 08/23/2013   Procedure: LEFT BREAST REDUCTION  ;  Surgeon: Theodoro Kos, DO;  Location: [facility];  Service: Plastics;  Laterality: Left;  . CESAREAN SECTION  07/15/2001; 03/18/2004; 11/21/2008  . LATISSIMUS FLAP TO BREAST Right 03/12/2013   Procedure: RIGHT BREAST LATISSIMUS FLAP WITH EXPANDER PLACEMENT;  Surgeon: Theodoro Kos, DO;  Location: [facility];  Service: Plastics;  Laterality: Right;  . LIPOSUCTION Bilateral 08/23/2013   Procedure: LIPOSUCTION;  Surgeon: Theodoro Kos, DO;  Location: [facility];  Service: Plastics;  Laterality: Bilateral;  . MODIFIED RADICAL MASTECTOMY Right 03/11/2010  . NASAL SEPTUM SURGERY    . PORT-A-CATH REMOVAL Left 12/01/2010  . PORTACATH PLACEMENT Left 09/12/2009  . REMOVAL OF TISSUE EXPANDER AND PLACEMENT OF IMPLANT Right 08/23/2013   Procedure: REMOVAL RIGHT TISSUE EXPANDER AND PLACEMENT OF IMPLANT TO RIGHT BREAST ;  Surgeon: Theodoro Kos, DO;  Location: [facility];  Service: Plastics;  Laterality: Right;  . SUPRA-UMBILICAL HERNIA  [id number]  . TUBAL LIGATION  11/21/2008     Current Outpatient Prescriptions  Medication Sig Dispense Refill  . Albuterol Sulfate 108 (90 Base) MCG/ACT AEPB Inhale 2 puffs into the lungs every 6 (six) hours as needed. 1 each 1  . ALPRAZolam (ALPRAZOLAM XR) 1 MG 24 hr tablet Take 1 tablet (1 mg total) by mouth daily. 30 tablet 2  . anastrozole (ARIMIDEX) 1 MG tablet Take 1 tablet (1 mg total) by mouth daily. 90 tablet 4  . carvedilol (COREG) 6.25 MG tablet Take 1 tablet (6.25 mg total) by mouth 2 (two) times daily with a meal. 60 tablet 5  . DULoxetine (CYMBALTA) 30 MG capsule Take 90 mg by mouth daily.     . furosemide (LASIX) 40 MG tablet Take 1 tablet (40 mg total) by mouth daily. 90 tablet 3  . goserelin (ZOLADEX) 3.6 MG injection Inject 3.6 mg into the skin every 28 (twenty-eight) days.    Marland Kitchen  metFORMIN (GLUCOPHAGE) 500 MG tablet TAKE ONE (1) TABLET BY MOUTH TWO (2) TIMES DAILY WITH A MEAL 60 tablet 5  . metoCLOPramide (REGLAN) 10 MG tablet Take 1 tablet (10 mg total) by mouth every 8 (eight) hours as needed. 60 tablet 1  . oxyCODONE (OXY IR/ROXICODONE) 5 MG immediate release tablet Take 1 tablet (5 mg total) by mouth every 6 (six) hours as needed for severe pain. 120 tablet 0  . polyethylene glycol powder (GLYCOLAX) powder Take 17 g by mouth daily. 500 g 5  . potassium chloride (K-DUR) 10 MEQ tablet Take 2 tablets (20 mEq total) by mouth 3 (three) times daily. 180 tablet  1  . tiZANidine (ZANAFLEX) 4 MG tablet TAKE ONE (1) TABLET BY MOUTH 3 TIMES DAILY 90 tablet 3  . topiramate (TOPAMAX) 200 MG tablet Take 1 tablet (200 mg total) by mouth at bedtime. 30 tablet 12  . traZODone (DESYREL) 100 MG tablet      No current facility-administered medications for this visit.     Allergies:   Penicillins; Lyrica [pregabalin]; Meloxicam; and Robaxin [methocarbamol]    Social History:  The patient  reports that she quit smoking about 6 years ago. She has a 1.25 pack-year smoking history. She has never used smokeless tobacco. She reports that she does not drink alcohol or use drugs.   Family History:  The patient's family history includes Diabetes type II in her father; Hypertension in her mother; Prostate cancer in her father.    ROS:  Please see the history of present illness.   Otherwise, review of systems are positive for none.   All other systems are reviewed and negative.    PHYSICAL EXAM: VS:  There were no vitals taken for this visit. , BMI There is no height or weight on file to calculate BMI. GENERAL:  Well appearing HEENT:  Pupils equal round and reactive, fundi not visualized, oral mucosa unremarkable NECK:  No jugular venous distention, waveform within normal limits, carotid upstroke brisk and symmetric, no bruits LYMPHATICS:  No cervical adenopathy LUNGS:  Clear to auscultation bilaterally HEART:  RRR.  PMI not displaced or sustained,S1 and S2 within normal limits, no S3, no S4, no clicks, no rubs, no murmurs ABD:  Flat, positive bowel sounds normal in frequency in pitch, no bruits, no rebound, no guarding, no midline pulsatile mass, no hepatomegaly, no splenomegaly EXT:  2 plus pulses throughout, no edema, no cyanosis no clubbing SKIN:  No rashes no nodules NEURO:  Cranial nerves II through XII grossly intact, motor grossly intact throughout PSYCH:  Cognitively intact, oriented to [place] place and time   EKG:  EKG is not ordered today. The ekg  ordered 10/01/14 demonstrates sinus rhythm rate 79 bpm.    Recent Labs: 09/17/2015: TSH 1.390 07/13/2016: ALT 17; BUN 10.1; Creatinine 0.8; HGB 13.2; Platelets 309; Potassium 3.1; Sodium 139    Lipid Panel    Component Value Date/Time   CHOL 100 09/17/2015 0817   TRIG 105 09/17/2015 0817   HDL 24 (L) 09/17/2015 0817   CHOLHDL 4.2 09/17/2015 0817   CHOLHDL 3.8 10/02/2014 0854   VLDL 13 10/02/2014 0854   LDLCALC 55 09/17/2015 0817      Wt Readings from Last 3 Encounters:  07/22/16 110 kg (242 lb 6.4 oz)  07/20/16 110 kg (242 lb 9.6 oz)  07/08/16 109.4 kg (241 lb 3.2 oz)      ASSESSMENT AND PLAN:  # Hypertension: # Hypokalemia: # LE edema:  Ms. Mies's BP  is well-controlled.  She continues to have hypokalemia after starting losartan. Given that she continues to have some lower extremity edema we will increase Lasix to 40 mg twice daily for 3 days followed by 40 mg daily after that. During the time and she is taking extra Lasix she will take potassium chloride 20 mEq twice daily. Once she is Lasix to daily she will start taking her potassium 20 mEq daily. She will return for a basic metabolic panel 2 weeks.  Continue carvedilol losartan.  # GLS: Mildly abnormal but systolic and diastolic were normal.   She is doing well on carvedilol and losartan.  Repeat echo 04/2016.   Current medicines are reviewed at length with the patient today.  The patient does not have concerns regarding medicines.  The following changes have been made:  no change  Labs/ tests ordered today include:   No orders of the defined types were placed in this encounter.    Disposition:   FU with Murle Hellstrom C. Oval Linsey, MD, [facility] in 6 months.  This note was written with the assistance of speech recognition software.  Please excuse any transcriptional errors.  Signed, Haasini Patnaude C. Oval Linsey, MD, [facility]  08/01/2016 5:46 PM    [facility]

## 2016-08-02 ENCOUNTER — Ambulatory Visit (HOSPITAL_BASED_OUTPATIENT_CLINIC_OR_DEPARTMENT_OTHER): Payer: Medicare Other | Admitting: Physical Medicine & Rehabilitation

## 2016-08-02 ENCOUNTER — Ambulatory Visit: Payer: Medicare Other | Admitting: Cardiovascular Disease

## 2016-08-02 ENCOUNTER — Encounter: Payer: Self-pay | Admitting: Family Medicine

## 2016-08-02 ENCOUNTER — Encounter: Payer: Medicare Other | Attending: Registered Nurse

## 2016-08-02 ENCOUNTER — Encounter: Payer: Self-pay | Admitting: Physical Medicine & Rehabilitation

## 2016-08-02 VITALS — BP 118/87 | HR 90

## 2016-08-02 DIAGNOSIS — G243 Spasmodic torticollis: Secondary | ICD-10-CM

## 2016-08-02 DIAGNOSIS — G893 Neoplasm related pain (acute) (chronic): Secondary | ICD-10-CM

## 2016-08-02 DIAGNOSIS — M546 Pain in thoracic spine: Secondary | ICD-10-CM | POA: Diagnosis not present

## 2016-08-02 DIAGNOSIS — I890 Lymphedema, not elsewhere classified: Secondary | ICD-10-CM | POA: Insufficient documentation

## 2016-08-02 MED ORDER — OXYCODONE HCL 5 MG PO TABS: 5.0000 mg | ORAL_TABLET | Freq: Four times a day (QID) | ORAL | 0 refills | Status: DC | PRN

## 2016-08-02 NOTE — Progress Notes (Signed)
Subjective:    Patient ID: Karen Hopkins, female    DOB: 1976/05/07, 41 y.o.   MRN: [id number]  HPI  Botox 07/05/16 Right Trapezius, 50 Right levator, 75 units Right sternocleidomastoid, 37.5 units Right platysma 12.5 units Right scalene 25 units  Patient notes reduced neck and shoulder girdle pain after injection as above. No postprocedural complications.  Pain Inventory Average Pain 7 Pain Right Now 6 My pain is sharp, burning, dull, stabbing, tingling and aching  In the last 24 hours, has pain interfered with the following? General activity 5 Relation with others 6 Enjoyment of life 6 What TIME of day is your pain at its worst? all Sleep (in general) Poor  Pain is worse with: bending, inactivity and some activites Pain improves with: rest, therapy/exercise, medication, TENS and injections Relief from Meds: 7  Mobility ability to climb steps?  yes do you drive?  yes  Function disabled: date disabled .  Neuro/Psych numbness tingling spasms dizziness depression anxiety  Prior Studies Any changes since last visit?  yes  Physicians involved in your care Any changes since last visit?  yes   Family History  Problem Relation Age of Onset  . Hypertension Mother   . Diabetes type II Father   . Prostate cancer Father   . Stroke Neg Hx    Social History   Social History  . Marital status: Single    Spouse name: N/A  . Number of children: 3  . Years of education: 30   Occupational History  . Disability     Social History Main Topics  . Smoking status: Former Smoker    Packs/day: 0.25    Years: 5.00    Quit date: 04/26/2010  . Smokeless tobacco: Never Used  . Alcohol use No  . Drug use: No  . Sexual activity: Not Currently    Birth control/ protection: Surgical   Other Topics Concern  . None   Social History Narrative   Lives with kids   Caffeine use: none    Past Surgical History:  Procedure Laterality Date  . BREAST REDUCTION SURGERY  Left 08/23/2013   Procedure: LEFT BREAST REDUCTION  ;  Surgeon: Theodoro Kos, DO;  Location: [facility];  Service: Plastics;  Laterality: Left;  . CESAREAN SECTION  07/15/2001; 03/18/2004; 11/21/2008  . LATISSIMUS FLAP TO BREAST Right 03/12/2013   Procedure: RIGHT BREAST LATISSIMUS FLAP WITH EXPANDER PLACEMENT;  Surgeon: Theodoro Kos, DO;  Location: [facility];  Service: Plastics;  Laterality: Right;  . LIPOSUCTION Bilateral 08/23/2013   Procedure: LIPOSUCTION;  Surgeon: Theodoro Kos, DO;  Location: [facility];  Service: Plastics;  Laterality: Bilateral;  . MODIFIED RADICAL MASTECTOMY Right 03/11/2010  . NASAL SEPTUM SURGERY    . PORT-A-CATH REMOVAL Left 12/01/2010  . PORTACATH PLACEMENT Left 09/12/2009  . REMOVAL OF TISSUE EXPANDER AND PLACEMENT OF IMPLANT Right 08/23/2013   Procedure: REMOVAL RIGHT TISSUE EXPANDER AND PLACEMENT OF IMPLANT TO RIGHT BREAST ;  Surgeon: Theodoro Kos, DO;  Location: [facility];  Service: Plastics;  Laterality: Right;  . SUPRA-UMBILICAL HERNIA  123456  . TUBAL LIGATION  11/21/2008   Past Medical History:  Diagnosis Date  . Anxiety   . Cancer ([place])   . Depression   . Essential hypertension 11/05/2015  . History of breast cancer 2011   right  . History of chemotherapy 2011  . History of radiation therapy 2011  . Hypertension    under control with med., has been  on med. x 2 yr.  . Lymphedema of arm    right; no BP or puncture to right arm  . Seasonal allergies   . Sinus headache    There were no vitals taken for this visit.  Opioid Risk Score:   Fall Risk Score:  `1  Depression screen PHQ 2/9  Depression screen [facility] 2/9 07/22/2016 07/20/2016 07/08/2016 08/04/2015 06/25/2015 03/04/2015 01/20/2015  Decreased Interest 0 0 0 0 0 0 0  Down, Depressed, Hopeless 0 0 0 0 0 0 0  PHQ - 2 Score 0 0 0 0 0 0 0  Altered sleeping - - - - - - -  Tired, decreased energy - - - - - - -  Change in appetite - - - - - - -  Feeling bad or failure  about yourself  - - - - - - -  Trouble concentrating - - - - - - -  Moving slowly or fidgety/restless - - - - - - -  Suicidal thoughts - - - - - - -  PHQ-9 Score - - - - - - -  Some recent data might be hidden    Review of Systems  Constitutional: Negative.   HENT: Negative.   Eyes: Negative.   Respiratory: Positive for wheezing.   Cardiovascular: Negative.   Gastrointestinal: Negative.   Endocrine: Negative.   Genitourinary: Negative.   Musculoskeletal: Negative.   Skin: Negative.   Allergic/Immunologic: Negative.   Neurological: Negative.   Hematological: Negative.   Psychiatric/Behavioral: Negative.        Objective:   Physical Exam  Constitutional: She is oriented to [place], place, and time. She appears well-developed and well-nourished.  HENT:  Head: Normocephalic and atraumatic.  Eyes: Conjunctivae are normal. Pupils are equal, round, and reactive to light.  Neurological: She is alert and oriented to [place], place, and time.  Psychiatric: She has a normal mood and affect.  Nursing note and vitals reviewed.   No tenderness over the right trapezius or the right levator or the right sternocleidomastoid. Some mild tenderness and tightness over the right platysma, right scalene  Full range of motion cervical spine      Assessment & Plan:  1. Chronic neck pain associated with neoplasm, right breast, status post mastectomy and radiation treatments. Continue low-dose oxycodone 5 mg 4 times a day Continue opioid monitoring program. This consists of regular clinic visits, examinations, urine drug screen, pill counts as well as use of [place] controlled substance reporting System. NP visit in 4 wk   2. Cervical dystonia Repeat Botox same dose in ~8 wks

## 2016-08-03 ENCOUNTER — Telehealth: Payer: Self-pay | Admitting: Family Medicine

## 2016-08-03 ENCOUNTER — Ambulatory Visit
Admission: RE | Admit: 2016-08-03 | Discharge: 2016-08-03 | Disposition: A | Discharge status: Home / Self Care | Payer: Medicare Other | Source: Ambulatory Visit | Attending: Family Medicine | Admitting: Family Medicine

## 2016-08-03 ENCOUNTER — Telehealth: Payer: Self-pay | Admitting: *Deleted

## 2016-08-03 ENCOUNTER — Encounter: Payer: Self-pay | Admitting: Family Medicine

## 2016-08-03 ENCOUNTER — Ambulatory Visit (INDEPENDENT_AMBULATORY_CARE_PROVIDER_SITE_OTHER): Payer: Medicare Other | Admitting: Family Medicine

## 2016-08-03 VITALS — BP 124/90 | HR 94 | Temp 98.3°F | Ht 64.0 in | Wt 238.0 lb

## 2016-08-03 DIAGNOSIS — R05 Cough: Secondary | ICD-10-CM | POA: Diagnosis not present

## 2016-08-03 DIAGNOSIS — R062 Wheezing: Secondary | ICD-10-CM

## 2016-08-03 DIAGNOSIS — M25572 Pain in left ankle and joints of left foot: Secondary | ICD-10-CM | POA: Diagnosis not present

## 2016-08-03 MED ORDER — ALBUTEROL SULFATE (2.5 MG/3ML) 0.083% IN NEBU
2.5000 mg | INHALATION_SOLUTION | Freq: Once | RESPIRATORY_TRACT | Status: AC
  Administered 2016-08-03: 2.5 mg via RESPIRATORY_TRACT

## 2016-08-03 MED ORDER — PREDNISONE 50 MG PO TABS: 50.0000 mg | ORAL_TABLET | Freq: Every day | ORAL | 0 refills | Status: DC

## 2016-08-03 MED ORDER — ALBUTEROL SULFATE (2.5 MG/3ML) 0.083% IN NEBU: 2.5000 mg | INHALATION_SOLUTION | Freq: Four times a day (QID) | RESPIRATORY_TRACT | 3 refills | Status: DC | PRN

## 2016-08-03 NOTE — Telephone Encounter (Signed)
Attempted to reach patient to discuss CXR results, which suggest possible pneumonia. Plan to start antibiotics. No answer on cell phone, even after calling twice. Voicemail is full and I can't leave a message. Will try again to call in the morning.  [name], MD

## 2016-08-03 NOTE — Telephone Encounter (Signed)
Left message on patient voicemail informing of appointment with cardiovascular ultrasound on 2/28 at 1pm at [facility].

## 2016-08-03 NOTE — Patient Instructions (Addendum)
For ankle pain/swelling: - get ultrasound of left leg to make sure no blood clot  For breathing: - another round of prednisone - albuterol nebulizer treatments - sent in the liquid, giving you the machine today - get chest xray  Return Thurs or Fri if not any better Otherwise follow up in 1 week Keep cardiology appointment  Be well, Dr. Ardelia Mems

## 2016-08-03 NOTE — Progress Notes (Signed)
Date of Visit: 08/03/2016   HPI:  Patient presents for follow up.  Recently seen for bronchitis and given regimen of azithro and prednisone. Had improved, but over the last several days has started to have worsening symptoms. Coughing up white sputum. Having wheezing. Worse at night. No fevers. Notes left ankle pain and swelling that are new. No blood in sputum. Using albuterol inhaler but thinks she's not inhaling it correctly. Would prefer nebulizer. Has upcoming cardiology appointment for her blood pressure. Doing better off the ACE.  ROS: See HPI.  [place]: history of obesity, type 2 diabetes, seizure, breast cancer, hypertension   PHYSICAL EXAM: BP 124/90   Pulse 94   Temp 98.3 F (36.8 C) (Oral)   Ht 5\' 4"  (1.626 m)   Wt 238 lb (108 kg)   BMI 40.85 kg/m   Ambulatory O2 99% Gen: no acute distress pleasant cooperative HEENT: normocephalic, atraumatic moist mucous membranes  Heart: regular rate and rhythm no murmur Lungs: normal work of breathing, speaks in full sentences without distress. +wheezing in all lung fields. Aeration & wheezing improved after albuterol neb Neuro: grossly nonfocal, speech norml Ext: left ankle with mild swelling and tenderness. Calves nontender to palpation, negative homans.  ASSESSMENT/PLAN:  41 yo F with history of breast cancer and recent bronchitis, without known history of asthma presenting with continued cough and wheezing. Responded well to neb treatment in clinic today.   Plan: -Check ultrasound left leg to rule out DVT given history of of malignancy, and unilateral ankle pain/swelling in setting of respiratory symptoms  -For wheezing will do another round of prednisone 50mg  daily x5days -Switch to albuterol neb- machine given to patient today, neb liquid given to pharmacy -Check CXR given prolonged duration of symptoms  -Will need PFTs with Dr. Valentina Lucks once improved -May ultimately need asthma controller medication  -Follow up here at [facility] in 1 week, sooner (Thurs or Fri) if not worsening.  FOLLOW UP: Follow up in 1 week for above issues  [place] J. Ardelia Mems, [place]

## 2016-08-04 ENCOUNTER — Ambulatory Visit (HOSPITAL_COMMUNITY)
Admission: RE | Admit: 2016-08-04 | Discharge: 2016-08-04 | Disposition: A | Discharge status: Home / Self Care | Payer: Medicare Other | Source: Ambulatory Visit | Attending: Family Medicine | Admitting: Family Medicine

## 2016-08-04 DIAGNOSIS — M25572 Pain in left ankle and joints of left foot: Secondary | ICD-10-CM | POA: Diagnosis not present

## 2016-08-04 MED ORDER — DOXYCYCLINE HYCLATE 100 MG PO TABS: 100.0000 mg | ORAL_TABLET | Freq: Two times a day (BID) | ORAL | 0 refills | Status: DC

## 2016-08-04 NOTE — Telephone Encounter (Signed)
Was able to reach patient about CXR results She confirms no possibility of pregnancy - will rx doxycycline alone as she has a pcn allergy Advised need for repeat CXR in 3-4 weeks. Has appointment today for lower extremity doppler ultrasound  [name], MD

## 2016-08-04 NOTE — Progress Notes (Signed)
VASCULAR LAB PRELIMINARY  PRELIMINARY  PRELIMINARY  PRELIMINARY  Left lower extremity venous duplex completed.    Preliminary report:  Left:  No evidence of DVT, superficial thrombosis, or Baker's cyst.  Shatha Hooser, RVS 08/04/2016, 1:49 PM

## 2016-08-05 ENCOUNTER — Ambulatory Visit: Payer: Medicare Other | Admitting: Physician Assistant

## 2016-08-05 LAB — BASIC METABOLIC PANEL
BUN/Creatinine Ratio: 13 (ref 9–23)
BUN: 13 mg/dL (ref 6–20)
CO2: 27 mmol/L (ref 18–29)
CREATININE: 0.98 mg/dL (ref 0.57–1.00)
Calcium: 10 mg/dL (ref 8.7–10.2)
Chloride: 107 mmol/L — ABNORMAL HIGH (ref 96–106)
GFR, EST AFRICAN AMERICAN: 85 mL/min/{1.73_m2} (ref >59)
GFR, EST NON AFRICAN AMERICAN: 73 mL/min/{1.73_m2} (ref >59)
Glucose: 81 mg/dL (ref 65–99)
Potassium: 4.1 mmol/L (ref 3.5–5.2)
Sodium: 136 mmol/L (ref 134–144)

## 2016-08-05 NOTE — Progress Notes (Deleted)
Cardiology Office Note   Date:  08/05/2016   ID:  Karen Hopkins, DOB 04-18-1976, MRN [id number]  PCP:  Chrisandra Netters, MD  Cardiologist:  Dr. Oval Linsey, 11/04/2015  Karen Ferries, PA-C   No chief complaint on file.   History of Present Illness: Karen Hopkins is a 41 y.o. female with a history of breast cancer s/p mastectomy, chemotherapy (4 cycles of doxorubicin and cyclophosphamide, 12 cycles of paclitaxel and trastuzumab), DM, HTN, anxiety  08/03/2016 PCP office visit, cough and wheezing that had been going on for prolonged period of time, chest x-ray with possible pneumonia and antibiotics given, lower extremity Dopplers checked for DVT given history of unilateral swelling but were negative Phone notes reflect hypotension, losartan discontinued  Karen Hopkins presents for ***   Past Medical History:  Diagnosis Date  . Anxiety   . Cancer ([place])   . Depression   . Essential hypertension 11/05/2015  . History of breast cancer 2011   right  . History of chemotherapy 2011  . History of radiation therapy 2011  . Hypertension    under control with med., has been on med. x 2 yr.  . Lymphedema of arm    right; no BP or puncture to right arm  . Seasonal allergies   . Sinus headache     Past Surgical History:  Procedure Laterality Date  . BREAST REDUCTION SURGERY Left 08/23/2013   Procedure: LEFT BREAST REDUCTION  ;  Surgeon: Theodoro Kos, DO;  Location: [place];  Service: Plastics;  Laterality: Left;  . CESAREAN SECTION  07/15/2001; 03/18/2004; 11/21/2008  . LATISSIMUS FLAP TO BREAST Right 03/12/2013   Procedure: RIGHT BREAST LATISSIMUS FLAP WITH EXPANDER PLACEMENT;  Surgeon: Theodoro Kos, DO;  Location: [place];  Service: Plastics;  Laterality: Right;  . LIPOSUCTION Bilateral 08/23/2013   Procedure: LIPOSUCTION;  Surgeon: Theodoro Kos, DO;  Location: [place];  Service: Plastics;  Laterality: Bilateral;  . MODIFIED RADICAL MASTECTOMY  Right 03/11/2010  . NASAL SEPTUM SURGERY    . PORT-A-CATH REMOVAL Left 12/01/2010  . PORTACATH PLACEMENT Left 09/12/2009  . REMOVAL OF TISSUE EXPANDER AND PLACEMENT OF IMPLANT Right 08/23/2013   Procedure: REMOVAL RIGHT TISSUE EXPANDER AND PLACEMENT OF IMPLANT TO RIGHT BREAST ;  Surgeon: Theodoro Kos, DO;  Location: [place];  Service: Plastics;  Laterality: Right;  . SUPRA-UMBILICAL HERNIA  [id number]  . TUBAL LIGATION  11/21/2008    Current Outpatient Prescriptions  Medication Sig Dispense Refill  . albuterol (PROVENTIL) (2.5 MG/3ML) 0.083% nebulizer solution Take 3 mLs (2.5 mg total) by nebulization every 6 (six) hours as needed for wheezing or shortness of breath. 75 mL 3  . Albuterol Sulfate 108 (90 Base) MCG/ACT AEPB Inhale 2 puffs into the lungs every 6 (six) hours as needed. 1 each 1  . ALPRAZolam (ALPRAZOLAM XR) 1 MG 24 hr tablet Take 1 tablet (1 mg total) by mouth daily. 30 tablet 2  . anastrozole (ARIMIDEX) 1 MG tablet Take 1 tablet (1 mg total) by mouth daily. 90 tablet 4  . carvedilol (COREG) 6.25 MG tablet Take 1 tablet (6.25 mg total) by mouth 2 (two) times daily with a meal. 60 tablet 5  . doxycycline (VIBRA-TABS) 100 MG tablet Take 1 tablet (100 mg total) by mouth 2 (two) times daily. 20 tablet 0  . DULoxetine (CYMBALTA) 30 MG capsule Take 90 mg by mouth daily.     . furosemide (LASIX) 40 MG tablet Take 1 tablet (  40 mg total) by mouth daily. 90 tablet 3  . goserelin (ZOLADEX) 3.6 MG injection Inject 3.6 mg into the skin every 28 (twenty-eight) days.    . metFORMIN (GLUCOPHAGE) 500 MG tablet TAKE ONE (1) TABLET BY MOUTH TWO (2) TIMES DAILY WITH A MEAL 60 tablet 5  . metoCLOPramide (REGLAN) 10 MG tablet Take 1 tablet (10 mg total) by mouth every 8 (eight) hours as needed. 60 tablet 1  . oxyCODONE (OXY IR/ROXICODONE) 5 MG immediate release tablet Take 1 tablet (5 mg total) by mouth every 6 (six) hours as needed for severe pain. 120 tablet 0  . polyethylene glycol powder  (GLYCOLAX) powder Take 17 g by mouth daily. 500 g 5  . potassium chloride (K-DUR) 10 MEQ tablet Take 2 tablets (20 mEq total) by mouth 3 (three) times daily. 180 tablet 1  . predniSONE (DELTASONE) 50 MG tablet Take 1 tablet (50 mg total) by mouth daily with breakfast. For 5 days 5 tablet 0  . tiZANidine (ZANAFLEX) 4 MG tablet TAKE ONE (1) TABLET BY MOUTH 3 TIMES DAILY 90 tablet 3  . topiramate (TOPAMAX) 200 MG tablet Take 1 tablet (200 mg total) by mouth at bedtime. 30 tablet 12  . traZODone (DESYREL) 100 MG tablet      No current facility-administered medications for this visit.     Allergies:   Penicillins; Lyrica [pregabalin]; Meloxicam; and Robaxin [methocarbamol]    Social History:  The patient  reports that she quit smoking about 6 years ago. She has a 1.25 pack-year smoking history. She has never used smokeless tobacco. She reports that she does not drink alcohol or use drugs.   Family History:  The patient's family history includes Diabetes type II in her father; Hypertension in her mother; Prostate cancer in her father.    ROS:  Please see the history of present illness. All other systems are reviewed and negative.    PHYSICAL EXAM: VS:  There were no vitals taken for this visit. , BMI There is no height or weight on file to calculate BMI. GEN: Well nourished, well developed, female in no acute distress  HEENT: normal for age  Neck: no JVD, no carotid bruit, no masses Cardiac: RRR; no murmur, no rubs, or gallops Respiratory:  clear to auscultation bilaterally, normal work of breathing GI: soft, nontender, nondistended, + BS MS: no deformity or atrophy; no edema; distal pulses are 2+ in all 4 extremities   Skin: warm and dry, no rash Neuro:  Strength and sensation are intact Psych: euthymic mood, full affect   EKG:  EKG {ACTION; IS/IS GI:087931 ordered today. The ekg ordered today demonstrates ***   Recent Labs: 09/17/2015: TSH 1.390 07/13/2016: ALT 17; BUN 10.1;  Creatinine 0.8; HGB 13.2; Platelets 309; Potassium 3.1; Sodium 139    Lipid Panel    Component Value Date/Time   CHOL 100 09/17/2015 0817   TRIG 105 09/17/2015 0817   HDL 24 (L) 09/17/2015 0817   CHOLHDL 4.2 09/17/2015 0817   CHOLHDL 3.8 10/02/2014 0854   VLDL 13 10/02/2014 0854   LDLCALC 55 09/17/2015 0817     Wt Readings from Last 3 Encounters:  08/03/16 238 lb (108 kg)  07/22/16 242 lb 6.4 oz (110 kg)  07/20/16 242 lb 9.6 oz (110 kg)     Other studies Reviewed: Additional studies/ records that were reviewed today include: ***.  ASSESSMENT AND PLAN:  1.  ***   Current medicines are reviewed at length with the patient today.  The patient {ACTIONS; HAS/DOES NOT HAVE:19233} concerns regarding medicines.  The following changes have been made:  {PLAN; NO CHANGE:13088:s}  Labs/ tests ordered today include: *** No orders of the defined types were placed in this encounter.    Disposition:   FU with ***  Signed, Karen Ferries, PA-C  08/05/2016 9:13 AM    [facility] Phone: [phone]; Fax: [phone]  This note was written with the assistance of speech recognition software. Please excuse any transcriptional errors.

## 2016-08-10 ENCOUNTER — Telehealth: Payer: Self-pay | Admitting: Cardiovascular Disease

## 2016-08-10 ENCOUNTER — Other Ambulatory Visit (HOSPITAL_BASED_OUTPATIENT_CLINIC_OR_DEPARTMENT_OTHER): Payer: Medicare Other

## 2016-08-10 ENCOUNTER — Ambulatory Visit (HOSPITAL_BASED_OUTPATIENT_CLINIC_OR_DEPARTMENT_OTHER): Payer: Medicare Other

## 2016-08-10 DIAGNOSIS — Z5111 Encounter for antineoplastic chemotherapy: Secondary | ICD-10-CM | POA: Diagnosis not present

## 2016-08-10 DIAGNOSIS — C50811 Malignant neoplasm of overlapping sites of right female breast: Secondary | ICD-10-CM | POA: Diagnosis not present

## 2016-08-10 DIAGNOSIS — C50911 Malignant neoplasm of unspecified site of right female breast: Secondary | ICD-10-CM

## 2016-08-10 LAB — COMPREHENSIVE METABOLIC PANEL
ALT: 13 U/L (ref 0–55)
AST: 11 U/L (ref 5–34)
Albumin: 3.8 g/dL (ref 3.5–5.0)
Alkaline Phosphatase: 113 U/L (ref 40–150)
Anion Gap: 9 mEq/L (ref 3–11)
BILIRUBIN TOTAL: 0.26 mg/dL (ref 0.20–1.20)
BUN: 8 mg/dL (ref 7.0–26.0)
CO2: 26 meq/L (ref 22–29)
Calcium: 9.7 mg/dL (ref 8.4–10.4)
Chloride: 108 mEq/L (ref 98–109)
Creatinine: 0.8 mg/dL (ref 0.6–1.1)
GLUCOSE: 125 mg/dL (ref 70–140)
Potassium: 3.4 mEq/L — ABNORMAL LOW (ref 3.5–5.1)
SODIUM: 143 meq/L (ref 136–145)
TOTAL PROTEIN: 7.6 g/dL (ref 6.4–8.3)

## 2016-08-10 LAB — CBC WITH DIFFERENTIAL/PLATELET
BASO%: 0.4 % (ref 0.0–2.0)
Basophils Absolute: 0.1 10*3/uL (ref 0.0–0.1)
EOS%: 11.7 % — ABNORMAL HIGH (ref 0.0–7.0)
Eosinophils Absolute: 1.5 10*3/uL — ABNORMAL HIGH (ref 0.0–0.5)
HCT: 41.4 % (ref 34.8–46.6)
HGB: 13.3 g/dL (ref 11.6–15.9)
LYMPH%: 31.4 % (ref 14.0–49.7)
MCH: 27.5 pg (ref 25.1–34.0)
MCHC: 32 g/dL (ref 31.5–36.0)
MCV: 85.8 fL (ref 79.5–101.0)
MONO#: 0.5 10*3/uL (ref 0.1–0.9)
MONO%: 4.1 % (ref 0.0–14.0)
NEUT%: 52.4 % (ref 38.4–76.8)
NEUTROS ABS: 6.8 10*3/uL — AB (ref 1.5–6.5)
Platelets: 373 10*3/uL (ref 145–400)
RBC: 4.83 10*6/uL (ref 3.70–5.45)
RDW: 15.7 % — AB (ref 11.2–14.5)
WBC: 13.1 10*3/uL — AB (ref 3.9–10.3)
lymph#: 4.1 10*3/uL — ABNORMAL HIGH (ref 0.9–3.3)

## 2016-08-10 MED ORDER — GOSERELIN ACETATE 3.6 MG ~~LOC~~ IMPL
3.6000 mg | DRUG_IMPLANT | Freq: Once | SUBCUTANEOUS | Status: AC
  Administered 2016-08-10: 3.6 mg via SUBCUTANEOUS
  Filled 2016-08-10: qty 3.6

## 2016-08-10 MED ORDER — CARVEDILOL 6.25 MG PO TABS: 6.2500 mg | ORAL_TABLET | Freq: Two times a day (BID) | ORAL | 0 refills | Status: DC

## 2016-08-10 NOTE — Patient Instructions (Signed)
Goserelin injection What is this medicine? GOSERELIN (GOE se rel in) is similar to a hormone found in the body. It lowers the amount of sex hormones that the body makes. Men will have lower testosterone levels and women will have lower estrogen levels while taking this medicine. In men, this medicine is used to treat prostate cancer; the injection is either given once per month or once every 12 weeks. A once per month injection (only) is used to treat women with endometriosis, dysfunctional uterine bleeding, or advanced breast cancer. This medicine may be used for other purposes; ask your health care provider or pharmacist if you have questions. What should I tell my health care provider before I take this medicine? They need to know if you have any of these conditions (some only apply to women): -diabetes -heart disease or previous heart attack -high blood pressure -high cholesterol -kidney disease -osteoporosis or low bone density -problems passing urine -spinal cord injury -stroke -tobacco smoker -an unusual or allergic reaction to goserelin, hormone therapy, other medicines, foods, dyes, or preservatives -pregnant or trying to get pregnant -breast-feeding How should I use this medicine? This medicine is for injection under the skin. It is given by a health care professional in a hospital or clinic setting. Men receive this injection once every 4 weeks or once every 12 weeks. Women will only receive the once every 4 weeks injection. Talk to your pediatrician regarding the use of this medicine in children. Special care may be needed. Overdosage: If you think you have taken too much of this medicine contact a poison control center or emergency room at once. NOTE: This medicine is only for you. Do not share this medicine with others. What if I miss a dose? It is important not to miss your dose. Call your doctor or health care professional if you are unable to keep an appointment. What may  interact with this medicine? -female hormones like estrogen -herbal or dietary supplements like black cohosh, chasteberry, or DHEA -female hormones like testosterone -prasterone This list may not describe all possible interactions. Give your health care provider a list of all the medicines, herbs, non-prescription drugs, or dietary supplements you use. Also tell them if you smoke, drink alcohol, or use illegal drugs. Some items may interact with your medicine. What should I watch for while using this medicine? Visit your doctor or health care professional for regular checks on your progress. Your symptoms may appear to get worse during the first weeks of this therapy. Tell your doctor or healthcare professional if your symptoms do not start to get better or if they get worse after this time. Your bones may get weaker if you take this medicine for a long time. If you smoke or frequently drink alcohol you may increase your risk of bone loss. A family history of osteoporosis, chronic use of drugs for seizures (convulsions), or corticosteroids can also increase your risk of bone loss. Talk to your doctor about how to keep your bones strong. This medicine should stop regular monthly menstration in women. Tell your doctor if you continue to menstrate. Women should not become pregnant while taking this medicine or for 12 weeks after stopping this medicine. Women should inform their doctor if they wish to become pregnant or think they might be pregnant. There is a potential for serious side effects to an unborn child. Talk to your health care professional or pharmacist for more information. Do not breast-feed an infant while taking this medicine. Men should   inform their doctors if they wish to father a child. This medicine may lower sperm counts. Talk to your health care professional or pharmacist for more information. What side effects may I notice from receiving this medicine? Side effects that you should  report to your doctor or health care professional as soon as possible: -allergic reactions like skin rash, itching or hives, swelling of the face, lips, or tongue -bone pain -breathing problems -changes in vision -chest pain -feeling faint or lightheaded, falls -fever, chills -pain, swelling, warmth in the leg -pain, tingling, numbness in the hands or feet -signs and symptoms of low blood pressure like dizziness; feeling faint or lightheaded, falls; unusually weak or tired -stomach pain -swelling of the ankles, feet, hands -trouble passing urine or change in the amount of urine -unusually high or low blood pressure -unusually weak or tired Side effects that usually do not require medical attention (report to your doctor or health care professional if they continue or are bothersome): -change in sex drive or performance -changes in breast size in both males and females -changes in emotions or moods -headache -hot flashes -irritation at site where injected -loss of appetite -skin problems like acne, dry skin -vaginal dryness This list may not describe all possible side effects. Call your doctor for medical advice about side effects. You may report side effects to FDA at 1-800-FDA-1088. Where should I keep my medicine? This drug is given in a hospital or clinic and will not be stored at home. NOTE: This sheet is a summary. It may not cover all possible information. If you have questions about this medicine, talk to your doctor, pharmacist, or health care provider.    2016, Elsevier/Gold Standard. (2013-07-31 11:10:35)  

## 2016-08-10 NOTE — Telephone Encounter (Signed)
Spoke with pt states that she had bronchitis a few weeks ago now she has pneumonia-right lung, she has finished prednisone and now is taking a 10 course of antibiotic (see chart) she further states that her PCP had her stop her losartan because her BP was too low. Just wanted to let Dr Oval Linsey know-refill sent for her carvedilol

## 2016-08-10 NOTE — Telephone Encounter (Signed)
New Message  Pt voiced wanting the nurse to call her in regards to her pneumonia she has, her medication and if her appt we scheduled is good enough.  Please f/u with pt

## 2016-08-11 NOTE — Telephone Encounter (Signed)
OK thank you 

## 2016-08-18 ENCOUNTER — Other Ambulatory Visit: Payer: Self-pay | Admitting: Family Medicine

## 2016-08-19 ENCOUNTER — Ambulatory Visit: Payer: Medicare Other | Admitting: Family Medicine

## 2016-08-25 ENCOUNTER — Ambulatory Visit: Payer: Medicare Other | Admitting: Family Medicine

## 2016-08-30 ENCOUNTER — Encounter: Payer: Self-pay | Admitting: Registered Nurse

## 2016-08-30 ENCOUNTER — Encounter: Payer: Medicare Other | Attending: Registered Nurse | Admitting: Registered Nurse

## 2016-08-30 ENCOUNTER — Telehealth: Payer: Self-pay | Admitting: Registered Nurse

## 2016-08-30 VITALS — BP 122/85 | HR 107

## 2016-08-30 DIAGNOSIS — Z5181 Encounter for therapeutic drug level monitoring: Secondary | ICD-10-CM

## 2016-08-30 DIAGNOSIS — Z79899 Other long term (current) drug therapy: Secondary | ICD-10-CM

## 2016-08-30 DIAGNOSIS — M545 Low back pain, unspecified: Secondary | ICD-10-CM

## 2016-08-30 DIAGNOSIS — G894 Chronic pain syndrome: Secondary | ICD-10-CM

## 2016-08-30 DIAGNOSIS — M791 Myalgia: Secondary | ICD-10-CM

## 2016-08-30 DIAGNOSIS — G243 Spasmodic torticollis: Secondary | ICD-10-CM | POA: Diagnosis not present

## 2016-08-30 DIAGNOSIS — M7918 Myalgia, other site: Secondary | ICD-10-CM

## 2016-08-30 DIAGNOSIS — M546 Pain in thoracic spine: Secondary | ICD-10-CM | POA: Diagnosis not present

## 2016-08-30 DIAGNOSIS — G8929 Other chronic pain: Secondary | ICD-10-CM | POA: Diagnosis not present

## 2016-08-30 DIAGNOSIS — G893 Neoplasm related pain (acute) (chronic): Secondary | ICD-10-CM

## 2016-08-30 DIAGNOSIS — F3289 Other specified depressive episodes: Secondary | ICD-10-CM

## 2016-08-30 DIAGNOSIS — I890 Lymphedema, not elsewhere classified: Secondary | ICD-10-CM | POA: Insufficient documentation

## 2016-08-30 MED ORDER — OXYCODONE HCL 5 MG PO TABS: 5.0000 mg | ORAL_TABLET | Freq: Four times a day (QID) | ORAL | 0 refills | Status: DC | PRN

## 2016-08-30 NOTE — Progress Notes (Signed)
Subjective:    Patient ID: Karen Hopkins, female    DOB: [birth year], 41 y.o.   MRN: [id number]  HPI: [name] [place] is a 41year old female who returns for follow up appointmentfor chronic pain and medication refill. She states her pain is located in her neck radiating into her right shoulder, also having left shoulder pain and lower back pain.She rates her pain 7. Her current exercise regime is walking and performing stretching exercises.  Also states she was diagnosed with Pneumonia, PCP following.   Pain Inventory Average Pain 6 Pain Right Now 7 My pain is sharp, burning, dull, stabbing, tingling and aching  In the last 24 hours, has pain interfered with the following? General activity 6 Relation with others 6 Enjoyment of life 6 What TIME of day is your pain at its worst? all times Sleep (in general) Poor  Pain is worse with: walking, bending, sitting, inactivity and standing Pain improves with: therapy/exercise, medication and injections Relief from Meds: 7  Mobility ability to climb steps?  yes do you drive?  yes  Function disabled: date disabled .  Neuro/Psych numbness tingling spasms confusion depression anxiety  Prior Studies Any changes since last visit?  no  Physicians involved in your care Any changes since last visit?  yes   Family History  Problem Relation Age of Onset  . Hypertension Mother   . Diabetes type II Father   . Prostate cancer Father   . Stroke Neg Hx    Social History   Social History  . Marital status: Single    Spouse name: N/A  . Number of children: 3  . Years of education: 39   Occupational History  . Disability     Social History Main Topics  . Smoking status: Former Smoker    Packs/day: 0.25    Years: 5.00    Quit date: 04/26/2010  . Smokeless tobacco: Never Used  . Alcohol use No  . Drug use: No  . Sexual activity: Not Currently    Birth control/ protection: Surgical   Other Topics Concern  . None    Social History Narrative   Lives with kids   Caffeine use: none    Past Surgical History:  Procedure Laterality Date  . BREAST REDUCTION SURGERY Left 08/23/2013   Procedure: LEFT BREAST REDUCTION  ;  Surgeon: Theodoro Kos, DO;  Location: [place];  Service: Plastics;  Laterality: Left;  . CESAREAN SECTION  07/15/2001; 03/18/2004; 11/21/2008  . LATISSIMUS FLAP TO BREAST Right 03/12/2013   Procedure: RIGHT BREAST LATISSIMUS FLAP WITH EXPANDER PLACEMENT;  Surgeon: Theodoro Kos, DO;  Location: [place];  Service: Plastics;  Laterality: Right;  . LIPOSUCTION Bilateral 08/23/2013   Procedure: LIPOSUCTION;  Surgeon: Theodoro Kos, DO;  Location: [place];  Service: Plastics;  Laterality: Bilateral;  . MODIFIED RADICAL MASTECTOMY Right 03/11/2010  . NASAL SEPTUM SURGERY    . PORT-A-CATH REMOVAL Left 12/01/2010  . PORTACATH PLACEMENT Left 09/12/2009  . REMOVAL OF TISSUE EXPANDER AND PLACEMENT OF IMPLANT Right 08/23/2013   Procedure: REMOVAL RIGHT TISSUE EXPANDER AND PLACEMENT OF IMPLANT TO RIGHT BREAST ;  Surgeon: Theodoro Kos, DO;  Location: [place];  Service: Plastics;  Laterality: Right;  . SUPRA-UMBILICAL HERNIA  [id number]  . TUBAL LIGATION  11/21/2008   Past Medical History:  Diagnosis Date  . Anxiety   . Cancer ([place])   . Depression   . Essential hypertension 11/05/2015  . History of breast  cancer 2011   right  . History of chemotherapy 2011  . History of radiation therapy 2011  . Hypertension    under control with med., has been on med. x 2 yr.  . Lymphedema of arm    right; no BP or puncture to right arm  . Seasonal allergies   . Sinus headache    BP 122/85   Pulse (!) 107   SpO2 98%   Opioid Risk Score:   Fall Risk Score:  `1  Depression screen PHQ 2/9  Depression screen [facility] 2/9 07/22/2016 07/20/2016 07/08/2016 08/04/2015 06/25/2015 03/04/2015 01/20/2015  Decreased Interest 0 0 0 0 0 0 0  Down, Depressed, Hopeless 0 0 0 0 0 0 0  PHQ - 2  Score 0 0 0 0 0 0 0  Altered sleeping - - - - - - -  Tired, decreased energy - - - - - - -  Change in appetite - - - - - - -  Feeling bad or failure about yourself  - - - - - - -  Trouble concentrating - - - - - - -  Moving slowly or fidgety/restless - - - - - - -  Suicidal thoughts - - - - - - -  PHQ-9 Score - - - - - - -  Some recent data might be hidden     Review of Systems  Constitutional: Positive for appetite change and unexpected weight change.  HENT: Negative.   Eyes: Negative.   Respiratory: Positive for cough, shortness of breath and wheezing.   Cardiovascular: Negative.   Gastrointestinal: Negative.   Endocrine: Negative.   Genitourinary: Negative.   Musculoskeletal: Negative.   Skin: Negative.   Allergic/Immunologic: Negative.   Neurological: Positive for numbness.  Hematological: Negative.   Psychiatric/Behavioral: Positive for confusion and dysphoric mood. The patient is nervous/anxious.        Objective:   Physical Exam  Constitutional: She is oriented to [place], place, and time. She appears well-developed and well-nourished.  HENT:  Head: Normocephalic and atraumatic.  Neck: Normal range of motion. Neck supple.  Cervical Paraspinal Tenderness: C-5-C-6  Cardiovascular: Normal rate and regular rhythm.   Pulmonary/Chest: Effort normal and breath sounds normal.  Musculoskeletal:  Normal Muscle Bulk and Muscle Testing Reveals: Upper Extremities: Left: Full ROM and Muscle Strength 5/5 Right: Decreased ROM 90 Degrees and Muscle Strength 5/5 Thoracic Paraspinal Tenderness: T-1-T-3 Lumbar Paraspinal Tenderness: L-3-L-5 Arises from Table with ease Narrow Based Gait  Neurological: She is alert and oriented to [place], place, and time.  Skin: Skin is warm and dry.  Psychiatric: She has a normal mood and affect.  Nursing note and vitals reviewed.         Assessment & Plan:  1.Myofascial pain syndrome chronic postoperative, as well as post radiation: S/P  Breast Reconstruction Surgery x 2. 08/30/2016 Refilled: Oxycodone 5 mg one tablet every 6 hours as needed for severe pain.# 120.Continue HEP. We will continue the opioid monitoring program, this consists of regular clinic visits, examinations, urine drug screen, pill counts as well as use of [place] Controlled Substance Reporting System. 2. Neoplasm Related Pain: Continue Current Medication Regime. 08/30/2016 3. Obesity: Losing weight continue with Healthy Diet and Exercise Regime. 08/30/2016 4. Muscle Spasms: Continue Tizanidine. 08/30/2016 5. Depression: Continue Cymbalta and Counseling at [place]  15  minutes of face to face patient care time was spent during this visit. All questions were encouraged and answered.  F/U in 1 month

## 2016-08-30 NOTE — Telephone Encounter (Signed)
On 08/30/2016 the [place] was reviewed no conflict was seen on the [place] with multiple prescribers. Ms. [name] has a signed narcotic contract with our office. If there were any discrepancies this would have been reported to her physician.

## 2016-08-31 ENCOUNTER — Ambulatory Visit (INDEPENDENT_AMBULATORY_CARE_PROVIDER_SITE_OTHER): Payer: Medicare Other | Admitting: Internal Medicine

## 2016-08-31 ENCOUNTER — Encounter: Payer: Self-pay | Admitting: Internal Medicine

## 2016-08-31 VITALS — BP 110/76 | HR 106 | Temp 98.2°F | Ht 64.0 in | Wt 242.0 lb

## 2016-08-31 DIAGNOSIS — J189 Pneumonia, unspecified organism: Secondary | ICD-10-CM | POA: Diagnosis not present

## 2016-08-31 DIAGNOSIS — L309 Dermatitis, unspecified: Secondary | ICD-10-CM | POA: Insufficient documentation

## 2016-08-31 DIAGNOSIS — G44209 Tension-type headache, unspecified, not intractable: Secondary | ICD-10-CM | POA: Diagnosis not present

## 2016-08-31 DIAGNOSIS — J302 Other seasonal allergic rhinitis: Secondary | ICD-10-CM | POA: Diagnosis not present

## 2016-08-31 DIAGNOSIS — J209 Acute bronchitis, unspecified: Secondary | ICD-10-CM | POA: Diagnosis not present

## 2016-08-31 DIAGNOSIS — L2082 Flexural eczema: Secondary | ICD-10-CM

## 2016-08-31 DIAGNOSIS — R062 Wheezing: Secondary | ICD-10-CM | POA: Diagnosis not present

## 2016-08-31 MED ORDER — TRIAMCINOLONE ACETONIDE 0.5 % EX OINT: 1.0000 "application " | TOPICAL_OINTMENT | Freq: Two times a day (BID) | CUTANEOUS | 0 refills | Status: DC

## 2016-08-31 MED ORDER — IBUPROFEN 200 MG PO TABS
400.0000 mg | ORAL_TABLET | Freq: Once | ORAL | Status: AC
  Administered 2016-08-31: 400 mg via ORAL

## 2016-08-31 MED ORDER — PREDNISONE 50 MG PO TABS: 50.0000 mg | ORAL_TABLET | Freq: Every day | ORAL | 0 refills | Status: DC

## 2016-08-31 MED ORDER — ALBUTEROL SULFATE HFA 108 (90 BASE) MCG/ACT IN AERS: 2.0000 | INHALATION_SPRAY | Freq: Four times a day (QID) | RESPIRATORY_TRACT | 2 refills | Status: DC | PRN

## 2016-08-31 NOTE — Patient Instructions (Addendum)
It was nice meeting you today Ms. Rahl!  Please start taking prednisone again. Begin by taking one 50mg  tablet with breakfast for 7 days. After that, take half a tablet (25 mg) a day for 3 days, then one half tablet (25 mg) every other day, for 14 days total.   It is very important to make sure you have the CT scan performed. We will let you know the results when we get them.   For eczema, it is very important to make sure you are moisturizing your skin at least twice a day. Use a thick moisturizer like Vaseline or Eucerin, especially after showering. You can use the Kenalog ointment on the affected areas of your skin up to two times daily. Do not use this on your face.   I would recommend Tylenol or ibuprofen for your sinus headache. You could also try a saline nasal spray to help with the congestion.   If you have any questions or concerns, please feel free to call the clinic.   Be well,  Dr. Avon Gully

## 2016-08-31 NOTE — Assessment & Plan Note (Signed)
Likely cause of patient's current nasal congestion and apparent sinus pressure headaches. Patient only taking Zyrtec currently and not interested in taking other medications. Not interested in trying nasal sprays. Discussed that an unmedicated nasal spray such as saline may be effective if patient does not want to use any medicated sprays. Also discussed trying Tylenol or ibuprofen for headache. Patient says this does not help with her pain.  - Ibuprofen 400mg  given in office for headache - Recommended ibuprofen/Tylenol PRN headache - Continue Zyrtec - Nasal saline PRN

## 2016-08-31 NOTE — Assessment & Plan Note (Signed)
Diagnosed as a child but recently worsening. Stressed importance of daily moisturizing with emollient such as Vaseline or Eucerin at least BID.  - Moisturize at least BID - Kenalog ointment BID PRN

## 2016-08-31 NOTE — Progress Notes (Signed)
Subjective:   Patient: Karen Hopkins       Birthdate: 1976-05-23       MRN: [id number]      HPI  Karen Hopkins is a 41 y.o. female presenting for PNA f/u.   PNA Seen by Dr. Ardelia Mems on 2/27 for f/u of bronchitis. CXR was obtained at that time which showed possible PNA. Patient was started on doxy on 2/28. She presents today for f/u.  Patient says she does not feel any better now than at last visit. She has completed the course of antibiotics with no apparent improvement. Reports she is still coughing, and is producing whitish yellow sputum without blood. She says she is having trouble taking deep breaths and difficulty breathing when coughing at times. Was given an albuterol neb at last appointment, but says that the portable neb does not have a battery so she cannot take it with her places. She has been using it at home about 2-3 times per day. She does not have an albuterol inhaler but would like one so she could use it when she is not at home. She endorses wheezing. Denies fevers, sore throat. Has not taken anything other than prescribed doxy for symptoms.   Eczema Says she was diagnosed as a child but hasn't required medication in years. Has been moisturizing her skin daily but has noticed that it has been worsening, primarily on her arms. She is requesting prescription cream today.   Nasal congestion Patient thinks is related to allergies. Occurs around this time every year. Takes Zyrtec for allergies but nothing else. Currently has a headache that she feels is related to sinus pressure and this is her most bothersome symptom. Has not taken any medication for congestion or headache. Says she is usually diagnosed with sinusitis and prescribed a Z pack when she has these symptoms by her oncologist but that doesn't usually help. Denies vision changes, nausea, vomiting, weakness, numbness associated with headache. Says she has tried nasal sprays in the past but none of them help.   Smoking  status reviewed. Patient is former smoker (Quit 2011, 1.25 pack years).   Review of Systems See HPI.     Objective:  Physical Exam  Constitutional: She is oriented to [place], place, and time.  Appears tired but otherwise well-appearing obese female in NAD  HENT:  Head: Normocephalic and atraumatic.  Mouth/Throat: Oropharynx is clear and moist. No oropharyngeal exudate.  Dried clear nasal discharge present around nares  Eyes: Conjunctivae and EOM are normal. Right eye exhibits no discharge. Left eye exhibits no discharge.  Cardiovascular: Normal rate, regular rhythm and normal heart sounds.   No murmur heard. Pulmonary/Chest: Effort normal.  Significant wheezing bilaterally. Able to speak in full sentences without difficulty on RA. No increased WOB on RA. Occasional coughing throughout encounter.   Neurological: She is alert and oriented to [place], place, and time. No cranial nerve deficit. Gait normal.  Skin:  Dry flaky patches present on bilateral upper extremities. Skin generally dry.   Psychiatric: Affect and judgment normal.      Assessment & Plan:  Allergic rhinitis Likely cause of patient's current nasal congestion and apparent sinus pressure headaches. Patient only taking Zyrtec currently and not interested in taking other medications. Not interested in trying nasal sprays. Discussed that an unmedicated nasal spray such as saline may be effective if patient does not want to use any medicated sprays. Also discussed trying Tylenol or ibuprofen for headache. Patient says this does not help  with her pain.  - Ibuprofen 400mg  given in office for headache - Recommended ibuprofen/Tylenol PRN headache - Continue Zyrtec - Nasal saline PRN  Acute bronchitis Not improving despite prednisone and antibiotics. Patient stable on RA and not in respiratory distress, with O2 sat 97% on RA. Significant wheezing bilaterally on exam, but breathing comfortably and able to speak in full sentences on  RA. Given patient's lack of improvement and history of cancer, will obtain CT to rule out more serious cause of persistent symptoms.  - CT chest - Begin 2 week prednisone taper - Prescribed albuterol inhaler as patient says she does not have one (appears that Dr. Ardelia Mems prescribed this previously but patient did not pick up) - Continue albuterol nebs PRN - Precepted with Dr. Ardelia Mems  Eczema Diagnosed as a child but recently worsening. Stressed importance of daily moisturizing with emollient such as Vaseline or Eucerin at least BID.  - Moisturize at least BID - Kenalog ointment BID PRN   Adin Hector, MD, MPH PGY-2 [facility] Pager [phone]

## 2016-08-31 NOTE — Assessment & Plan Note (Signed)
Not improving despite prednisone and antibiotics. Patient stable on RA and not in respiratory distress, with O2 sat 97% on RA. Significant wheezing bilaterally on exam, but breathing comfortably and able to speak in full sentences on RA. Given patient's lack of improvement and history of cancer, will obtain CT to rule out more serious cause of persistent symptoms.  - CT chest - Begin 2 week prednisone taper - Prescribed albuterol inhaler as patient says she does not have one (appears that Dr. Ardelia Mems prescribed this previously but patient did not pick up) - Continue albuterol nebs PRN - Precepted with Dr. Ardelia Mems

## 2016-09-02 LAB — TOXASSURE SELECT,+ANTIDEPR,UR

## 2016-09-03 ENCOUNTER — Other Ambulatory Visit: Payer: Medicare Other | Admitting: *Deleted

## 2016-09-03 ENCOUNTER — Ambulatory Visit (INDEPENDENT_AMBULATORY_CARE_PROVIDER_SITE_OTHER): Payer: Medicare Other | Admitting: Cardiovascular Disease

## 2016-09-03 ENCOUNTER — Encounter: Payer: Self-pay | Admitting: Cardiovascular Disease

## 2016-09-03 VITALS — BP 124/84 | HR 94 | Ht 64.0 in | Wt 241.0 lb

## 2016-09-03 DIAGNOSIS — R609 Edema, unspecified: Secondary | ICD-10-CM | POA: Diagnosis not present

## 2016-09-03 DIAGNOSIS — R0602 Shortness of breath: Secondary | ICD-10-CM | POA: Diagnosis not present

## 2016-09-03 MED ORDER — FUROSEMIDE 40 MG PO TABS: 40.0000 mg | ORAL_TABLET | Freq: Every day | ORAL | 3 refills | Status: DC

## 2016-09-03 NOTE — Progress Notes (Signed)
Cardiology Office Note   Date:  09/03/2016   ID:  Karen Hopkins, DOB Oct 31, 1975, MRN [id number]  PCP:  Karen Netters, MD  Cardiologist:   Karen Latch, MD   Chief Complaint  Patient presents with  . Follow-up    discuss bp meds-losartan, coreg    History of Present Illness: Karen Hopkins is a 41 y.o. female with breast cancer s/p mastectomy, chemotherapy (4 cycles of doxorubicin and cyclophosphamide, 12 cycles of paclitaxel and trastuzumab) diabetes who presents for follow-up.  She presented 5/16 with shortness of breath.  She had an echo that revealed LVEF 55-60% and normal diastolic function. Her global longitudinal strain was -16.2%, which is slightly higher (worse) than her prior, which was -20%.  At that appointment Maxzide was discontinued and she was started on losartan and carvedilol.  She noted lower extremity edema so lasix was increased. Since that appointment her BP was low so losartan was discontinued.  [name] was diagnosed with pneumonia 07/2016. She completed antibiotics but continues to have cough productive of white sputum. She also notes wheezing and shortness of breath. She endorses orthopnea but states this is chronic. She thinks she has sleep apnea, though 2 sleep studies have been negative. However she notes that she was unable to sleep during the sleep studies. She notes mild lower extremity edema that improved after increasing her Lasix. Given her persistent symptoms she is scheduled for chest CT next week.  She denies chest pain or pressure.  Past Medical History:  Diagnosis Date  . Anxiety   . Cancer ([place])   . Depression   . Essential hypertension 11/05/2015  . History of breast cancer 2011   right  . History of chemotherapy 2011  . History of radiation therapy 2011  . Hypertension    under control with med., has been on med. x 2 yr.  . Lymphedema of arm    right; no BP or puncture to right arm  . Seasonal allergies   . Sinus headache      Past Surgical History:  Procedure Laterality Date  . BREAST REDUCTION SURGERY Left 08/23/2013   Procedure: LEFT BREAST REDUCTION  ;  Surgeon: Karen Kos, DO;  Location: [facility];  Service: Plastics;  Laterality: Left;  . CESAREAN SECTION  07/15/2001; 03/18/2004; 11/21/2008  . LATISSIMUS FLAP TO BREAST Right 03/12/2013   Procedure: RIGHT BREAST LATISSIMUS FLAP WITH EXPANDER PLACEMENT;  Surgeon: Karen Kos, DO;  Location: [facility];  Service: Plastics;  Laterality: Right;  . LIPOSUCTION Bilateral 08/23/2013   Procedure: LIPOSUCTION;  Surgeon: Karen Kos, DO;  Location: [facility];  Service: Plastics;  Laterality: Bilateral;  . MODIFIED RADICAL MASTECTOMY Right 03/11/2010  . NASAL SEPTUM SURGERY    . PORT-A-CATH REMOVAL Left 12/01/2010  . PORTACATH PLACEMENT Left 09/12/2009  . REMOVAL OF TISSUE EXPANDER AND PLACEMENT OF IMPLANT Right 08/23/2013   Procedure: REMOVAL RIGHT TISSUE EXPANDER AND PLACEMENT OF IMPLANT TO RIGHT BREAST ;  Surgeon: Karen Kos, DO;  Location: [facility];  Service: Plastics;  Laterality: Right;  . SUPRA-UMBILICAL HERNIA  [id number]  . TUBAL LIGATION  11/21/2008     Current Outpatient Prescriptions  Medication Sig Dispense Refill  . albuterol (PROVENTIL HFA;VENTOLIN HFA) 108 (90 Base) MCG/ACT inhaler Inhale 2 puffs into the lungs every 6 (six) hours as needed for wheezing or shortness of breath. 1 Inhaler 2  . albuterol (PROVENTIL) (2.5 MG/3ML) 0.083% nebulizer solution Take 3 mLs (2.5 mg total) by  nebulization every 6 (six) hours as needed for wheezing or shortness of breath. 75 mL 3  . Albuterol Sulfate 108 (90 Base) MCG/ACT AEPB Inhale 2 puffs into the lungs every 6 (six) hours as needed. 1 each 1  . ALPRAZolam (ALPRAZOLAM XR) 1 MG 24 hr tablet Take 1 tablet (1 mg total) by mouth daily. 30 tablet 2  . anastrozole (ARIMIDEX) 1 MG tablet Take 1 tablet (1 mg total) by mouth daily. 90 tablet 4  . carvedilol (COREG) 6.25 MG tablet  Take 1 tablet (6.25 mg total) by mouth 2 (two) times daily with a meal. 60 tablet 0  . doxycycline (VIBRA-TABS) 100 MG tablet Take 1 tablet (100 mg total) by mouth 2 (two) times daily. 20 tablet 0  . DULoxetine (CYMBALTA) 30 MG capsule Take 90 mg by mouth daily.     . furosemide (LASIX) 40 MG tablet Take 1 tablet (40 mg total) by mouth daily. 90 tablet 3  . goserelin (ZOLADEX) 3.6 MG injection Inject 3.6 mg into the skin every 28 (twenty-eight) days.    . metFORMIN (GLUCOPHAGE) 500 MG tablet TAKE ONE (1) TABLET BY MOUTH TWO (2) TIMES DAILY WITH A MEAL 60 tablet 5  . metoCLOPramide (REGLAN) 10 MG tablet Take 1 tablet (10 mg total) by mouth every 8 (eight) hours as needed. 60 tablet 1  . oxyCODONE (OXY IR/ROXICODONE) 5 MG immediate release tablet Take 1 tablet (5 mg total) by mouth every 6 (six) hours as needed for severe pain. 120 tablet 0  . polyethylene glycol powder (GLYCOLAX) powder Take 17 g by mouth daily. 500 g 5  . potassium chloride (K-DUR) 10 MEQ tablet Take 2 tablets (20 mEq total) by mouth 3 (three) times daily. 180 tablet 1  . predniSONE (DELTASONE) 50 MG tablet Take 1 tablet (50 mg total) by mouth daily with breakfast. 14 tablet 0  . tiZANidine (ZANAFLEX) 4 MG tablet TAKE ONE (1) TABLET BY MOUTH 3 TIMES DAILY 90 tablet 3  . topiramate (TOPAMAX) 200 MG tablet Take 1 tablet (200 mg total) by mouth at bedtime. 30 tablet 12  . traZODone (DESYREL) 100 MG tablet     . triamcinolone ointment (KENALOG) 0.5 % Apply 1 application topically 2 (two) times daily. 30 g 0   No current facility-administered medications for this visit.     Allergies:   Penicillins; Lyrica [pregabalin]; Meloxicam; and Robaxin [methocarbamol]    Social History:  The patient  reports that she quit smoking about 6 years ago. She has a 1.25 pack-year smoking history. She has never used smokeless tobacco. She reports that she does not drink alcohol or use drugs.   Family History:  The patient's family history includes  Diabetes type II in her father; Hypertension in her mother; Prostate cancer in her father.    ROS:  Please see the history of present illness.   Otherwise, review of systems are positive for none.   All other systems are reviewed and negative.    PHYSICAL EXAM: VS:  BP 124/84   Pulse 94   Ht 5\' 4"  (1.626 m)   Wt 109.3 kg (241 lb)   BMI 41.37 kg/m  , BMI Body mass index is 41.37 kg/m. GENERAL:  Well appearing HEENT:  Pupils equal round and reactive, fundi not visualized, oral mucosa unremarkable NECK:  No jugular venous distention, waveform within normal limits, carotid upstroke brisk and symmetric, no bruits LYMPHATICS:  No cervical adenopathy LUNGS:  Diffuse, mild, expiratory wheezing. HEART:  RRR.  PMI  not displaced or sustained,S1 and S2 within normal limits, no S3, no S4, no clicks, no rubs, no murmurs ABD:  Flat, positive bowel sounds normal in frequency in pitch, no bruits, no rebound, no guarding, no midline pulsatile mass, no hepatomegaly, no splenomegaly EXT:  2 plus pulses throughout, trace edema, no cyanosis no clubbing SKIN:  No rashes no nodules NEURO:  Cranial nerves II through XII grossly intact, motor grossly intact throughout PSYCH:  Cognitively intact, oriented to Rh[place] place and time   EKG:  EKG is not ordered today. The ekg ordered 10/01/14 demonstrates sinus rhythm rate 79 bpm.  09/03/16: Sinus rhythm.  Rate 90 bpm.     Recent Labs: 09/17/2015: TSH 1.390 08/10/2016: ALT 13; BUN 8.0; Creatinine 0.8; HGB 13.3; Platelets 373; Potassium 3.4; Sodium 143    Lipid Panel    Component Value Date/Time   CHOL 100 09/17/2015 0817   TRIG 105 09/17/2015 0817   HDL 24 (L) 09/17/2015 0817   CHOLHDL 4.2 09/17/2015 0817   CHOLHDL 3.8 10/02/2014 0854   VLDL 13 10/02/2014 0854   LDLCALC 55 09/17/2015 0817      Wt Readings from Last 3 Encounters:  09/03/16 109.3 kg (241 lb)  08/31/16 109.8 kg (242 lb)  08/03/16 108 kg (238 lb)      ASSESSMENT AND PLAN:  #  Shortness of breath: # Wheezing: This is likely due to her pneumonia.  She does not appear to be grossly volume overloaded. We will check a BNP to ensure that heart failure is not attributing.   # Hypertension: # Hypokalemia: # LE edema:  Ms. Lammert's BP is well-controlled. Losartan was discontinued due to low blood pressure. Continue carvedilol and Lasix.    Current medicines are reviewed at length with the patient today.  The patient does not have concerns regarding medicines.  The following changes have been made:  no change  Labs/ tests ordered today include:   Orders Placed This Encounter  Procedures  . B Nat Peptide  . EKG 12-Lead     Disposition:   FU with Meygan Kyser C. Oval Linsey, MD, [facility] in 6 months.  This note was written with the assistance of speech recognition software.  Please excuse any transcriptional errors.  Signed, Eura Radabaugh C. Oval Linsey, MD, [facility]  09/03/2016 12:53 PM    [facility]

## 2016-09-03 NOTE — Progress Notes (Signed)
ro bnp

## 2016-09-03 NOTE — Addendum Note (Signed)
Addended by: Eulis Foster on: 09/03/2016 02:58 PM   Modules accepted: Orders

## 2016-09-03 NOTE — Patient Instructions (Addendum)
Medication Instructions:  Your physician recommends that you continue on your current medications as directed. Please refer to the Current Medication list given to you today.  Labwork: BNP AT [facility] [address]  Testing/Procedures: NONE  Follow-Up: Your physician wants you to follow-up in: [place] will receive a reminder letter in the mail two months in advance. If you don't receive a letter, please call our office to schedule the follow-up appointment.  If you need a refill on your cardiac medications before your next appointment, please call your pharmacy.

## 2016-09-04 LAB — PRO B NATRIURETIC PEPTIDE: NT-PRO BNP: 20 pg/mL (ref 0–130)

## 2016-09-07 ENCOUNTER — Other Ambulatory Visit: Payer: Self-pay | Admitting: Oncology

## 2016-09-07 ENCOUNTER — Ambulatory Visit (HOSPITAL_BASED_OUTPATIENT_CLINIC_OR_DEPARTMENT_OTHER): Payer: Medicare Other

## 2016-09-07 ENCOUNTER — Other Ambulatory Visit (HOSPITAL_BASED_OUTPATIENT_CLINIC_OR_DEPARTMENT_OTHER): Payer: Medicare Other

## 2016-09-07 ENCOUNTER — Telehealth: Payer: Self-pay | Admitting: Oncology

## 2016-09-07 ENCOUNTER — Ambulatory Visit (HOSPITAL_BASED_OUTPATIENT_CLINIC_OR_DEPARTMENT_OTHER): Payer: Medicare Other | Admitting: Oncology

## 2016-09-07 VITALS — BP 135/88 | HR 92 | Temp 97.7°F | Resp 20 | Wt 242.2 lb

## 2016-09-07 DIAGNOSIS — G4700 Insomnia, unspecified: Secondary | ICD-10-CM | POA: Diagnosis not present

## 2016-09-07 DIAGNOSIS — C50811 Malignant neoplasm of overlapping sites of right female breast: Secondary | ICD-10-CM

## 2016-09-07 DIAGNOSIS — R52 Pain, unspecified: Secondary | ICD-10-CM

## 2016-09-07 DIAGNOSIS — C50911 Malignant neoplasm of unspecified site of right female breast: Secondary | ICD-10-CM

## 2016-09-07 DIAGNOSIS — R0602 Shortness of breath: Secondary | ICD-10-CM | POA: Diagnosis not present

## 2016-09-07 DIAGNOSIS — Z5111 Encounter for antineoplastic chemotherapy: Secondary | ICD-10-CM

## 2016-09-07 DIAGNOSIS — Z170 Estrogen receptor positive status [ER+]: Secondary | ICD-10-CM

## 2016-09-07 DIAGNOSIS — N951 Menopausal and female climacteric states: Secondary | ICD-10-CM

## 2016-09-07 DIAGNOSIS — R05 Cough: Secondary | ICD-10-CM | POA: Diagnosis not present

## 2016-09-07 LAB — CBC WITH DIFFERENTIAL/PLATELET
BASO%: 0.2 % (ref 0.0–2.0)
Basophils Absolute: 0 10*3/uL (ref 0.0–0.1)
EOS%: 1.1 % (ref 0.0–7.0)
Eosinophils Absolute: 0.1 10*3/uL (ref 0.0–0.5)
HEMATOCRIT: 39.4 % (ref 34.8–46.6)
HEMOGLOBIN: 13.1 g/dL (ref 11.6–15.9)
LYMPH#: 3.4 10*3/uL — AB (ref 0.9–3.3)
LYMPH%: 25.5 % (ref 14.0–49.7)
MCH: 28 pg (ref 25.1–34.0)
MCHC: 33.2 g/dL (ref 31.5–36.0)
MCV: 84.2 fL (ref 79.5–101.0)
MONO#: 0.6 10*3/uL (ref 0.1–0.9)
MONO%: 4.3 % (ref 0.0–14.0)
NEUT%: 68.9 % (ref 38.4–76.8)
NEUTROS ABS: 9.1 10*3/uL — AB (ref 1.5–6.5)
Platelets: 364 10*3/uL (ref 145–400)
RBC: 4.68 10*6/uL (ref 3.70–5.45)
RDW: 15.2 % — AB (ref 11.2–14.5)
WBC: 13.2 10*3/uL — ABNORMAL HIGH (ref 3.9–10.3)

## 2016-09-07 LAB — COMPREHENSIVE METABOLIC PANEL
ALBUMIN: 3.9 g/dL (ref 3.5–5.0)
ALK PHOS: 106 U/L (ref 40–150)
ALT: 14 U/L (ref 0–55)
AST: 9 U/L (ref 5–34)
Anion Gap: 11 mEq/L (ref 3–11)
BUN: 12.3 mg/dL (ref 7.0–26.0)
CALCIUM: 9.5 mg/dL (ref 8.4–10.4)
CHLORIDE: 107 meq/L (ref 98–109)
CO2: 23 mEq/L (ref 22–29)
CREATININE: 0.9 mg/dL (ref 0.6–1.1)
EGFR: >90 mL/min/{1.73_m2} (ref >90)
Glucose: 126 mg/dl (ref 70–140)
Potassium: 3.1 mEq/L — ABNORMAL LOW (ref 3.5–5.1)
Sodium: 141 mEq/L (ref 136–145)
Total Bilirubin: 0.24 mg/dL (ref 0.20–1.20)
Total Protein: 7.8 g/dL (ref 6.4–8.3)

## 2016-09-07 MED ORDER — ANASTROZOLE 1 MG PO TABS: 1.0000 mg | ORAL_TABLET | Freq: Every day | ORAL | 4 refills | Status: DC

## 2016-09-07 MED ORDER — GOSERELIN ACETATE 3.6 MG ~~LOC~~ IMPL
3.6000 mg | DRUG_IMPLANT | Freq: Once | SUBCUTANEOUS | Status: AC
  Administered 2016-09-07: 3.6 mg via SUBCUTANEOUS
  Filled 2016-09-07: qty 3.6

## 2016-09-07 NOTE — Telephone Encounter (Signed)
Gave patient avs report and appointments for May thru October and mammo for 4/25 at the [facility].

## 2016-09-07 NOTE — Patient Instructions (Signed)
Goserelin injection What is this medicine? GOSERELIN (GOE se rel in) is similar to a hormone found in the body. It lowers the amount of sex hormones that the body makes. Men will have lower testosterone levels and women will have lower estrogen levels while taking this medicine. In men, this medicine is used to treat prostate cancer; the injection is either given once per month or once every 12 weeks. A once per month injection (only) is used to treat women with endometriosis, dysfunctional uterine bleeding, or advanced breast cancer. This medicine may be used for other purposes; ask your health care provider or pharmacist if you have questions. What should I tell my health care provider before I take this medicine? They need to know if you have any of these conditions (some only apply to women): -diabetes -heart disease or previous heart attack -high blood pressure -high cholesterol -kidney disease -osteoporosis or low bone density -problems passing urine -spinal cord injury -stroke -tobacco smoker -an unusual or allergic reaction to goserelin, hormone therapy, other medicines, foods, dyes, or preservatives -pregnant or trying to get pregnant -breast-feeding How should I use this medicine? This medicine is for injection under the skin. It is given by a health care professional in a hospital or clinic setting. Men receive this injection once every 4 weeks or once every 12 weeks. Women will only receive the once every 4 weeks injection. Talk to your pediatrician regarding the use of this medicine in children. Special care may be needed. Overdosage: If you think you have taken too much of this medicine contact a poison control center or emergency room at once. NOTE: This medicine is only for you. Do not share this medicine with others. What if I miss a dose? It is important not to miss your dose. Call your doctor or health care professional if you are unable to keep an appointment. What may  interact with this medicine? -female hormones like estrogen -herbal or dietary supplements like black cohosh, chasteberry, or DHEA -female hormones like testosterone -prasterone This list may not describe all possible interactions. Give your health care provider a list of all the medicines, herbs, non-prescription drugs, or dietary supplements you use. Also tell them if you smoke, drink alcohol, or use illegal drugs. Some items may interact with your medicine. What should I watch for while using this medicine? Visit your doctor or health care professional for regular checks on your progress. Your symptoms may appear to get worse during the first weeks of this therapy. Tell your doctor or healthcare professional if your symptoms do not start to get better or if they get worse after this time. Your bones may get weaker if you take this medicine for a long time. If you smoke or frequently drink alcohol you may increase your risk of bone loss. A family history of osteoporosis, chronic use of drugs for seizures (convulsions), or corticosteroids can also increase your risk of bone loss. Talk to your doctor about how to keep your bones strong. This medicine should stop regular monthly menstration in women. Tell your doctor if you continue to menstrate. Women should not become pregnant while taking this medicine or for 12 weeks after stopping this medicine. Women should inform their doctor if they wish to become pregnant or think they might be pregnant. There is a potential for serious side effects to an unborn child. Talk to your health care professional or pharmacist for more information. Do not breast-feed an infant while taking this medicine. Men should   inform their doctors if they wish to father a child. This medicine may lower sperm counts. Talk to your health care professional or pharmacist for more information. What side effects may I notice from receiving this medicine? Side effects that you should  report to your doctor or health care professional as soon as possible: -allergic reactions like skin rash, itching or hives, swelling of the face, lips, or tongue -bone pain -breathing problems -changes in vision -chest pain -feeling faint or lightheaded, falls -fever, chills -pain, swelling, warmth in the leg -pain, tingling, numbness in the hands or feet -signs and symptoms of low blood pressure like dizziness; feeling faint or lightheaded, falls; unusually weak or tired -stomach pain -swelling of the ankles, feet, hands -trouble passing urine or change in the amount of urine -unusually high or low blood pressure -unusually weak or tired Side effects that usually do not require medical attention (report to your doctor or health care professional if they continue or are bothersome): -change in sex drive or performance -changes in breast size in both males and females -changes in emotions or moods -headache -hot flashes -irritation at site where injected -loss of appetite -skin problems like acne, dry skin -vaginal dryness This list may not describe all possible side effects. Call your doctor for medical advice about side effects. You may report side effects to FDA at 1-800-FDA-1088. Where should I keep my medicine? This drug is given in a hospital or clinic and will not be stored at home. NOTE: This sheet is a summary. It may not cover all possible information. If you have questions about this medicine, talk to your doctor, pharmacist, or health care provider.    2016, Elsevier/Gold Standard. (2013-07-31 11:10:35)  

## 2016-09-07 NOTE — Progress Notes (Signed)
Mr.thank youIDEllamae Hopkins Rede   DOB: September 10, 1975  MR#: [id number]  XKP#:[id number]  PCP: Karen Netters, MD GYN: SU: Karen Coho, MD;  Karen Kos, MD OTHER MD: Karen Penna, MD;  Karen Nakayama, MD  CHIEF COMPLAINT:  Locally advanced right breast cancer  CURRENT TREATMENT: Anastrozole, goserelin  HISTORY OF PRESENT ILLNESS: From the original intake note:  The patient noted swelling and redness, as well as some tenderness, over her right breast for some time. She thought this might be related to pregnancy, since she delivered her third child about 8 months ago. As the problem did not improve, she brought it to her primary care physician's attention. He treated her with Bactrim, which she says she could not tolerate because of nausea and vomiting, but which in any case did not resolve the problem, so he set her up for mammography at [facility] 08/26/2009. Dr. Sadie Hopkins found by exam marked skin thickening and erythema of the right breast extending pretty much throughout the breast. By mammography there were scattered fibroglandular densities, and an asymmetric density in the right upper outer quadrant with suspicious microcalcifications. There were also enlarged right axillary lymph nodes. The left appeared normal. By ultrasound there was an ill defined area of hypoechoic tissue in the right upper outer quadrant that was difficult to measure. There were also abnormal right axillary lymph nodes.  Ultrasound guided biopsy was performed the same day, and showed [phone]) a poorly differentiated invasive ductal carcinoma, which was estrogen receptor poor at 3%, progesterone receptor positive at 14% with an elevated proliferation marker at 50%, but importantly with strong amplification of HER-2 by CISH with a ratio of 5.20.  Bilateral breast MRIs were obtained 09/02/2009. This showed an area up to 10.6 cm in the right breast showing confluent mass and non-mass enhancement. There was marked  skin thickening involving the entire right breast, and numerous bulky right axillary lymph nodes were identified. The left breast was unremarkable, and there was no evidence of internal mammary lymph node involvement.  Patient was treated in the neoadjuvant setting with 4 dose dense cycles of doxorubicin and cyclophosphamide, followed by 12 weekly doses of paclitaxel and trastuzumab. Karen Hopkins underwent definitive right modified radical mastectomy in October 2011, with a residual 2.5 mm area of tumor in the breast, grade 3, and one of 21 lymph nodes involved.   Her subsequent history is as detailed below  INTERVAL HISTORY: Karen Hopkins returns today for follow-up of her estrogen receptor positive breast cancer. The history is generally unremarkable. She receives goserelin every 4 weeks. She tolerates that well. She does have hot flashes, but vaginal dryness is not a major issue.  She is also on anastrozole. She tolerates that well. She obtains it at a good price.  Because of her fatigue and shortness of breath Karen Hopkins was evaluated by cardiology. The echo from last year was reviewed and a BNP was obtained which was normal at 20  REVIEW OF SYSTEMS: Karen Hopkins had an episode of pneumonia February of this year. She was treated with doxycycline and prednisone with significant improvement in symptoms. She has a repeat CT scan of the chest scheduled for later this week for further evaluation. She tells me she feels tired, has problems with insomnia, continues to have stabbing throbbing and achy pains which are both intermittent and some are consistent, followed through the pain clinic. She still feels short of breath and still has a cough but it is not productive and currently she has no fever. She feels  anxious and depressed. A detailed review of systems today was otherwise stable  PAST MEDICAL HISTORY: Past Medical History:  Diagnosis Date  . Anxiety   . Cancer ([place])   . Depression   . Essential hypertension  11/05/2015  . History of breast cancer 2011   right  . History of chemotherapy 2011  . History of radiation therapy 2011  . Hypertension    under control with med., has been on med. x 2 yr.  . Lymphedema of arm    right; no BP or puncture to right arm  . Seasonal allergies   . Sinus headache     PAST SURGICAL HISTORY: Past Surgical History:  Procedure Laterality Date  . BREAST REDUCTION SURGERY Left 08/23/2013   Procedure: LEFT BREAST REDUCTION  ;  Surgeon: Karen Kos, DO;  Location: [facility];  Service: Plastics;  Laterality: Left;  . CESAREAN SECTION  07/15/2001; 03/18/2004; 11/21/2008  . LATISSIMUS FLAP TO BREAST Right 03/12/2013   Procedure: RIGHT BREAST LATISSIMUS FLAP WITH EXPANDER PLACEMENT;  Surgeon: Karen Kos, DO;  Location: [facility];  Service: Plastics;  Laterality: Right;  . LIPOSUCTION Bilateral 08/23/2013   Procedure: LIPOSUCTION;  Surgeon: Karen Kos, DO;  Location: [facility];  Service: Plastics;  Laterality: Bilateral;  . MODIFIED RADICAL MASTECTOMY Right 03/11/2010  . NASAL SEPTUM SURGERY    . PORT-A-CATH REMOVAL Left 12/01/2010  . PORTACATH PLACEMENT Left 09/12/2009  . REMOVAL OF TISSUE EXPANDER AND PLACEMENT OF IMPLANT Right 08/23/2013   Procedure: REMOVAL RIGHT TISSUE EXPANDER AND PLACEMENT OF IMPLANT TO RIGHT BREAST ;  Surgeon: Karen Kos, DO;  Location: [facility];  Service: Plastics;  Laterality: Right;  . SUPRA-UMBILICAL HERNIA  1829  . TUBAL LIGATION  11/21/2008    FAMILY HISTORY Family History  Problem Relation Age of Onset  . Hypertension Mother   . Diabetes type II Father   . Prostate cancer Father   . Stroke Neg Hx   There is no history of breast or ovarian cancer in the immediate family, but of the patient's mother's mother's six sisters, two (the patient's great-aunts) had ovarian cancer.  GYNECOLOGIC HISTORY:   (Updated 11/26/2013) The patient is GX, P3. First child was premature. Age at first  delivery, 41 years old.   she status post tubal ligation. She continues to have menstrual cycles, although irregularly.  SOCIAL HISTORY: (Updated 11/26/2013) She is disabled. Karen Hopkins's children are Karen Hopkins, and [name]. They are 14, 12 and 7 as of September 2017. They are all boys, all at home. The patient attends a non-denominational church in [place], which she considers her home, but she is currently living in [place].   ADVANCED DIRECTIVES: not in place  HEALTH MAINTENANCE:  (Updated 11/26/2013) Social History  Substance Use Topics  . Smoking status: Former Smoker    Packs/day: 0.25    Years: 5.00    Quit date: 04/26/2010  . Smokeless tobacco: Never Used  . Alcohol use No    Colonoscopy: Never  PAP: April 2015  Bone density: Never  Lipid panel: August 2014    Allergies  Allergen Reactions  . Penicillins Swelling    FACIAL SWELLING  . Lyrica [Pregabalin] Nausea Only    .  Marland Kitchen Meloxicam Nausea Only  . Robaxin [Methocarbamol] Nausea Only    Current Outpatient Prescriptions  Medication Sig Dispense Refill  . albuterol (PROVENTIL HFA;VENTOLIN HFA) 108 (90 Base) MCG/ACT inhaler Inhale 2 puffs into the lungs every 6 (six) hours as needed for  wheezing or shortness of breath. 1 Inhaler 2  . albuterol (PROVENTIL) (2.5 MG/3ML) 0.083% nebulizer solution Take 3 mLs (2.5 mg total) by nebulization every 6 (six) hours as needed for wheezing or shortness of breath. 75 mL 3  . Albuterol Sulfate 108 (90 Base) MCG/ACT AEPB Inhale 2 puffs into the lungs every 6 (six) hours as needed. 1 each 1  . ALPRAZolam (ALPRAZOLAM XR) 1 MG 24 hr tablet Take 1 tablet (1 mg total) by mouth daily. 30 tablet 2  . anastrozole (ARIMIDEX) 1 MG tablet Take 1 tablet (1 mg total) by mouth daily. 90 tablet 4  . carvedilol (COREG) 6.25 MG tablet Take 1 tablet (6.25 mg total) by mouth 2 (two) times daily with a meal. 60 tablet 0  . doxycycline (VIBRA-TABS) 100 MG tablet Take 1 tablet (100 mg total) by mouth  2 (two) times daily. 20 tablet 0  . DULoxetine (CYMBALTA) 30 MG capsule Take 90 mg by mouth daily.     . furosemide (LASIX) 40 MG tablet Take 1 tablet (40 mg total) by mouth daily. 90 tablet 3  . goserelin (ZOLADEX) 3.6 MG injection Inject 3.6 mg into the skin every 28 (twenty-eight) days.    . metFORMIN (GLUCOPHAGE) 500 MG tablet TAKE ONE (1) TABLET BY MOUTH TWO (2) TIMES DAILY WITH A MEAL 60 tablet 5  . metoCLOPramide (REGLAN) 10 MG tablet Take 1 tablet (10 mg total) by mouth every 8 (eight) hours as needed. 60 tablet 1  . oxyCODONE (OXY IR/ROXICODONE) 5 MG immediate release tablet Take 1 tablet (5 mg total) by mouth every 6 (six) hours as needed for severe pain. 120 tablet 0  . polyethylene glycol powder (GLYCOLAX) powder Take 17 g by mouth daily. 500 g 5  . potassium chloride (K-DUR) 10 MEQ tablet Take 2 tablets (20 mEq total) by mouth 3 (three) times daily. 180 tablet 1  . predniSONE (DELTASONE) 50 MG tablet Take 1 tablet (50 mg total) by mouth daily with breakfast. 14 tablet 0  . tiZANidine (ZANAFLEX) 4 MG tablet TAKE ONE (1) TABLET BY MOUTH 3 TIMES DAILY 90 tablet 3  . topiramate (TOPAMAX) 200 MG tablet Take 1 tablet (200 mg total) by mouth at bedtime. 30 tablet 12  . traZODone (DESYREL) 100 MG tablet     . triamcinolone ointment (KENALOG) 0.5 % Apply 1 application topically 2 (two) times daily. 30 g 0   No current facility-administered medications for this visit.     OBJECTIVE: Young African American woman What appears stated age 41:   09/07/16 1057  BP: 135/88  Pulse: 92  Resp: 20  Temp: 97.7 F (36.5 C)     Body mass index is 41.57 kg/m.    ECOG FS: 1   Filed Weights   09/07/16 1057  Weight: 242 lb 3.2 oz (109.9 kg)   Sclerae unicteric, pupils round and equal Oropharynx clear and moist No cervical or supraclavicular adenopathy Lungs no rales or rhonchi Heart regular rate and rhythm Abd soft, obese, nontender, positive bowel sounds MSK no focal spinal tenderness,  no upper extremity lymphedema Neuro: nonfocal, well oriented, appropriate affect Breasts: She has undergone right mastectomy with implant reconstruction, and there is no evidence of local recurrence. The left breast is unremarkable. Both axillae are benign.  LAB RESULTS: Lab Results  Component Value Date   WBC 13.2 (H) 09/07/2016   NEUTROABS 9.1 (H) 09/07/2016   HGB 13.1 09/07/2016   HCT 39.4 09/07/2016   MCV 84.2 09/07/2016   PLT  364 09/07/2016      Chemistry      Component Value Date/Time   NA 143 08/10/2016 1117   K 3.4 (L) 08/10/2016 1117   CL 107 (H) 07/22/2016 1128   CL 105 09/27/2012 1044   CO2 26 08/10/2016 1117   BUN 8.0 08/10/2016 1117   CREATININE 0.8 08/10/2016 1117      Component Value Date/Time   CALCIUM 9.7 08/10/2016 1117   ALKPHOS 113 08/10/2016 1117   AST 11 08/10/2016 1117   ALT 13 08/10/2016 1117   BILITOT 0.26 08/10/2016 1117     STUDIES: Chest x-ray 08/03/2016 shows what appears to be atelectasis in the right lower lobe-- there were no masses or congestive heart failure on this film  ASSESSMENT: 41 y.o.  BRCA1 and BRCA2 negative Karen Hopkins woman, status post right breast biopsy in March 2011 for a high-grade invasive ductal carcinoma involving overlapping sites of the right breast, ER positive 3%, PR positive 14%, strongly HER2/neu positive with MIB-1 of 15%. Biopsy proven lymph node involvement at presentation. Changes consistent with inflammatory carcinoma.   1. Treated neoadjuvantly, status post 4 dose-dense cycles of doxorubicin/cyclophosphamide, followed by 12 weekly doses of paclitaxel with trastuzumab completed September 2011.   2. Trastuzumab was then continued for a total of 1 year [to June 2012]. Post-treatment echo showed a well-preserved ejection fraction.   3. Status post right modified radical mastectomy in October 2011 showing a ypT1a ypN1 invasive ductal carcinoma, grade 3.  Status post right reconstruction with implant, March 2015    4. postmastectomy radiation completed in January 2012  5. on tamoxifen beginning January of 2012 stopped somewhere in Spring/Summer 2016  (a) anastrozole started November 2016.  6. other problems include chronic postsurgical pain, chronic right upper extremity lymphedema, and chronic depression  7. Goserelin started 09/24/2014, repeated every 4 weeks  PLAN: Karen Hopkins is now 6-1/2 years out from definitive surgery for her breast cancer with no evidence of disease recurrence. This is very favorable.  She is tolerating anastrozole well and also the goserelin and we are continuing both.  She is very concerned about her pneumonia and we discussed getting a chest x-ray today but she is already scheduled for CT of the chest in 2 days and that will be was a lot more information so the plan then is simply to follow up on that.  She understands her white cell count is a little bit up because of the steroids she is still receiving. I does not indicate ongoing infection.  She is behind on her mammography and I have set her up for left mammogram sometime within this month.  We also reviewed the notes from her cardiologist. She thought that she had fluid around the heart or congestive heart failure but she does not have either of those problems. She actually is doing quite well from a cardiac point of view but it is a good idea for her to have close cardiac follow up giving the treatment she had in the past  She will see me again in 6 months. She knows to call for any problems that may develop before then. Chauncey Cruel, MD  09/07/2016

## 2016-09-09 ENCOUNTER — Ambulatory Visit (HOSPITAL_COMMUNITY): Payer: Medicare Other

## 2016-09-13 ENCOUNTER — Other Ambulatory Visit: Payer: Self-pay

## 2016-09-16 ENCOUNTER — Ambulatory Visit (HOSPITAL_COMMUNITY)
Admission: RE | Admit: 2016-09-16 | Discharge: 2016-09-16 | Disposition: A | Discharge status: Home / Self Care | Payer: Medicare Other | Source: Ambulatory Visit | Attending: Family Medicine | Admitting: Family Medicine

## 2016-09-16 ENCOUNTER — Encounter (HOSPITAL_COMMUNITY): Payer: Self-pay

## 2016-09-16 DIAGNOSIS — Z8701 Personal history of pneumonia (recurrent): Secondary | ICD-10-CM | POA: Insufficient documentation

## 2016-09-16 DIAGNOSIS — J189 Pneumonia, unspecified organism: Secondary | ICD-10-CM

## 2016-09-16 DIAGNOSIS — Z923 Personal history of irradiation: Secondary | ICD-10-CM | POA: Diagnosis not present

## 2016-09-16 DIAGNOSIS — Z9011 Acquired absence of right breast and nipple: Secondary | ICD-10-CM | POA: Diagnosis not present

## 2016-09-16 DIAGNOSIS — J984 Other disorders of lung: Secondary | ICD-10-CM | POA: Insufficient documentation

## 2016-09-16 DIAGNOSIS — R05 Cough: Secondary | ICD-10-CM | POA: Insufficient documentation

## 2016-09-16 MED ORDER — IOPAMIDOL (ISOVUE-300) INJECTION 61%
INTRAVENOUS | Status: AC
  Administered 2016-09-16: 75 mL
  Filled 2016-09-16: qty 75

## 2016-09-17 ENCOUNTER — Ambulatory Visit: Payer: Medicare Other | Admitting: Internal Medicine

## 2016-09-21 ENCOUNTER — Encounter: Payer: Self-pay | Admitting: Family Medicine

## 2016-09-21 ENCOUNTER — Ambulatory Visit (INDEPENDENT_AMBULATORY_CARE_PROVIDER_SITE_OTHER): Payer: Medicare Other | Admitting: Family Medicine

## 2016-09-21 ENCOUNTER — Telehealth: Payer: Self-pay | Admitting: *Deleted

## 2016-09-21 DIAGNOSIS — R0600 Dyspnea, unspecified: Secondary | ICD-10-CM

## 2016-09-21 MED ORDER — BECLOMETHASONE DIPROPIONATE 80 MCG/ACT IN AERS: 2.0000 | INHALATION_SPRAY | Freq: Two times a day (BID) | RESPIRATORY_TRACT | 1 refills | Status: DC

## 2016-09-21 NOTE — Patient Instructions (Signed)
Start the qvar inhaler 2 puffs twice daily  See Dr. Valentina Lucks next week on Monday  See me in a couple weeks after that.  Be well, Dr. Ardelia Mems

## 2016-09-21 NOTE — Telephone Encounter (Signed)
Pharmacist from [facility] called to request Qvar to ProVentil 220 mcg.  Qvar is no longer be made.  Please change to ProVentil.  Derl Barrow, RN

## 2016-09-22 ENCOUNTER — Other Ambulatory Visit: Payer: Medicare Other

## 2016-09-22 ENCOUNTER — Ambulatory Visit: Payer: Medicare Other | Admitting: Oncology

## 2016-09-23 ENCOUNTER — Telehealth: Payer: Self-pay | Admitting: Registered Nurse

## 2016-09-23 DIAGNOSIS — R0600 Dyspnea, unspecified: Secondary | ICD-10-CM | POA: Insufficient documentation

## 2016-09-23 MED ORDER — FLUTICASONE PROPIONATE HFA 220 MCG/ACT IN AERO: 1.0000 | INHALATION_SPRAY | Freq: Two times a day (BID) | RESPIRATORY_TRACT | 2 refills | Status: DC

## 2016-09-23 MED ORDER — MOMETASONE FUROATE 220 MCG/INH IN AEPB: 1.0000 | INHALATION_SPRAY | Freq: Two times a day (BID) | RESPIRATORY_TRACT | 2 refills | Status: DC

## 2016-09-23 NOTE — Telephone Encounter (Signed)
Spoke with pharmacy. They can't get Qvar anymore. Tried to switch to asmanex but apparently not covered by her insurance. Flovent is covered, this was sent in. Patient aware.  [name], MD

## 2016-09-23 NOTE — Progress Notes (Signed)
Date of Visit: 09/21/2016   HPI:  Patient presents to follow up on shortness of breath.  She is still dyspneic with exertion. Using albuterol nebs every 4 hours roughly. Also uses inhaler. No fevers. Minimal productive cough. Feels herself wheezing. She saw Dr. Avon Gully here at the [facility] and was put on another round of oral prednisone, with improvement in symptoms but they recurred as soon as prednisone was stopped. Prednisone seems to be what helps the most. No known history of asthma. Has never smoked. Does have history of breast cancer and per oncology notes, has been treated in the past with doxorubicin, cyclophosphamide, paclitaxel, and trastuzumab. Currently receiving goserelin and anastrozole. Dyspnea is limiting her ability to do ADL's - she is getting quite short of breath with exertion.  ROS: See HPI.  [place]: history of obesity, type 2 diabetes, depression, allergic rhinitis, breast cancer s/p mastectomy & chemo, eczema  PHYSICAL EXAM: BP 118/72 (BP Location: Left Arm, Patient Position: Sitting, Cuff Size: Large)   Pulse 100   Temp 98 F (36.7 C) (Axillary)   Ht 5\' 4"  (1.626 m)   Wt 242 lb (109.8 kg)   BMI 41.54 kg/m   O2 95% on RA Gen: no acute distress, pleasant, cooperative HEENT: normocephalic, atraumatic, moist mucous membranes  Heart: regular rate and rhythm, no murmur Lungs: no crackles. End expiratory wheezes present throughout. Speaks in full sentences without distress. No cough Neuro: alert, grossly nonfocal, speech normal Ext: minimal edema  ASSESSMENT/PLAN:  Dyspnea Persistent ever since episode of bronchitis which occurred first in late January/early February. Has been subsequently treated for pneumonia, had several rounds of prednisone, and has undergone CT scan of chest without obvious findings to explain her symptoms. Requiring frequent albuterol dosing and functionally quite limited by her symptoms. Does still have some faint wheezing today on exam. No hypoxia  or frank volume overload.  Differential diagnosis includes new onset asthma, postviral pneumonitis/cough, pulmonary toxicity from prior chemo, CHF, among other things.   Reviewed side effect profile of her prior chemo drugs and discovered that doxorubicin and trastuzumab both carry risk of cardiomyopathy, while trastuzumab and cyclophosphamide carry risk of pulmonary toxicity.   Plan: - appointment scheduled with Dr. Valentina Lucks for spirometry next week - start inhaled corticosteroid now (discussed with Dr. Valentina Lucks after visit, he is ok with this prior to PFTs) - follow up with me in office after visit with Dr. Valentina Lucks - if spirometry unrevealing, will obtain updated echo - if this workup further unrevealing, will refer to pulmonology  FOLLOW UP: Follow up next week for spirometry Then see me after that visit   [place] J. Ardelia Mems, [place]

## 2016-09-23 NOTE — Telephone Encounter (Signed)
Karen Hopkins had a UDS performed on 08/30/2016. It is consistent, she's prescribed Oxycodone.

## 2016-09-23 NOTE — Assessment & Plan Note (Signed)
Persistent ever since episode of bronchitis which occurred first in late January/early February. Has been subsequently treated for pneumonia, had several rounds of prednisone, and has undergone CT scan of chest without obvious findings to explain her symptoms. Requiring frequent albuterol dosing and functionally quite limited by her symptoms. Does still have some faint wheezing today on exam. No hypoxia or frank volume overload.  Differential diagnosis includes new onset asthma, postviral pneumonitis/cough, pulmonary toxicity from prior chemo, CHF, among other things.   Reviewed side effect profile of her prior chemo drugs and discovered that doxorubicin and trastuzumab both carry risk of cardiomyopathy, while trastuzumab and cyclophosphamide carry risk of pulmonary toxicity.   Plan: - appointment scheduled with Dr. Valentina Lucks for spirometry next week - start inhaled corticosteroid now (discussed with Dr. Valentina Lucks after visit, he is ok with this prior to PFTs) - follow up with me in office after visit with Dr. Valentina Lucks - if spirometry unrevealing, will obtain updated echo - if this workup further unrevealing, will refer to pulmonology

## 2016-09-27 ENCOUNTER — Ambulatory Visit (INDEPENDENT_AMBULATORY_CARE_PROVIDER_SITE_OTHER): Payer: Medicare Other | Admitting: Pharmacist

## 2016-09-27 ENCOUNTER — Other Ambulatory Visit: Payer: Self-pay

## 2016-09-27 ENCOUNTER — Encounter: Payer: Self-pay | Admitting: Family Medicine

## 2016-09-27 ENCOUNTER — Encounter: Payer: Self-pay | Admitting: Pharmacist

## 2016-09-27 DIAGNOSIS — C50811 Malignant neoplasm of overlapping sites of right female breast: Secondary | ICD-10-CM

## 2016-09-27 DIAGNOSIS — E876 Hypokalemia: Secondary | ICD-10-CM

## 2016-09-27 DIAGNOSIS — R0602 Shortness of breath: Secondary | ICD-10-CM | POA: Diagnosis not present

## 2016-09-27 MED ORDER — ALBUTEROL SULFATE HFA 108 (90 BASE) MCG/ACT IN AERS: 2.0000 | INHALATION_SPRAY | Freq: Four times a day (QID) | RESPIRATORY_TRACT | 3 refills | Status: DC | PRN

## 2016-09-27 MED ORDER — ALPRAZOLAM ER 1 MG PO TB24: 1.0000 mg | ORAL_TABLET | Freq: Every day | ORAL | 2 refills | Status: DC

## 2016-09-27 MED ORDER — FLUTICASONE PROPIONATE HFA 220 MCG/ACT IN AERO: 2.0000 | INHALATION_SPRAY | Freq: Two times a day (BID) | RESPIRATORY_TRACT | 2 refills | Status: DC

## 2016-09-27 NOTE — Patient Instructions (Addendum)
Thank you for coming in today!  Your lung function tests were normal and showed improvement after the albuterol nebulizer treatment.   Take Flovent 2 puffs two times a day. Blow out before you inhale the medication. Hold your breath for ~10 seconds and blow out of your nose. Rinse your mouth out and spit afterwards.   Take albuterol as needed and before you workout.

## 2016-09-27 NOTE — Progress Notes (Signed)
   S:    Patient arrives in good spirits. Presents for lung function evaluation. Patient was referred on 09/21/2016. Patient reports significant SOB that has persisted since pneumonia in Jan/Feb. Patient reports cough with occasional white sputum. Patient reports persistent allergies that has worsened her breathing. Patient reports not being able to exercise because of her SOB. Breathing worsens when around heavy perfumes/chemicals.   Patient reports last dose of Flovent (recent start) and albuterol was last night.   O:  See "scanned report" or Documentation Flowsheet (discrete results - PFTs) for  Spirometry results. Patient provided good effort while attempting spirometry.   Albuterol Neb  Lot# [id number] Exp. [id number]  A/P: Spirometry evaluation with Pre and Post Bronchodilator reveals normal lung function with FVC/FEV1 24%/21% reversibility post bronchodilator.  Patient has been experiencing SOB for 2-3 months since having pneumonia in Jan/Feb and taking Flovent and albuterol daily. Patient's breathing improved significantly after albuterol neb treatment. Increased Flovent inhaler to 2 puffs BID and continue albuterol PRN. Educated patient on proper inhaler technique and use of maintenance vs rescue inhaler. Unlikely that these symptoms are side effects of medications including anastrazole, carvedilol and goserelin due to reversibility with albuterol treatment.  Educated patient on purpose, proper use, potential adverse effects including risk of esophageal candidiasis and need to rinse mouth after each use.  Reviewed results of pulmonary function tests.  Patient verbalized understanding of results and education.  Written pt instructions provided.  F/U Clinic visit 2-3 weeks.   Total time in face to face counseling 30 minutes.  Patient seen with Ida Rogue, PharmD Candidate, and Demetrius Charity, PharmD PGY-1 Resident.

## 2016-09-27 NOTE — Assessment & Plan Note (Signed)
Spirometry evaluation with Pre and Post Bronchodilator reveals normal lung function with FVC/FEV1 24%/21% reversibility post bronchodilator.  Patient has been experiencing SOB for 2-3 months since having pneumonia in Jan/Feb and taking Flovent and albuterol daily. Patient's breathing improved significantly after albuterol neb treatment. Increased Flovent inhaler to 2 puffs BID and continue albuterol PRN. Educated patient on proper inhaler technique and use of maintenance vs rescue inhaler. Unlikely that these symptoms are side effects of medications including anastrazole, carvedilol and goserelin due to reversibility with albuterol treatment.  Educated patient on purpose, proper use, potential adverse effects including risk of esophageal candidiasis and need to rinse mouth after each use.  Reviewed results of pulmonary function tests.

## 2016-09-29 ENCOUNTER — Ambulatory Visit: Payer: Medicare Other

## 2016-09-30 ENCOUNTER — Telehealth: Payer: Self-pay

## 2016-09-30 NOTE — Telephone Encounter (Signed)
Called pharmacy to verify that they received a refill for xanax.  Pharmacy states they did receive the refill and pt has picked up the medication

## 2016-10-01 DIAGNOSIS — R062 Wheezing: Secondary | ICD-10-CM | POA: Diagnosis not present

## 2016-10-04 ENCOUNTER — Ambulatory Visit: Payer: Medicare Other | Admitting: Registered Nurse

## 2016-10-04 ENCOUNTER — Encounter: Payer: Medicare Other | Attending: Registered Nurse

## 2016-10-04 ENCOUNTER — Ambulatory Visit (HOSPITAL_BASED_OUTPATIENT_CLINIC_OR_DEPARTMENT_OTHER): Payer: Medicare Other | Admitting: Physical Medicine & Rehabilitation

## 2016-10-04 ENCOUNTER — Encounter: Payer: Self-pay | Admitting: Physical Medicine & Rehabilitation

## 2016-10-04 VITALS — BP 107/69 | HR 100 | Resp 14

## 2016-10-04 DIAGNOSIS — M546 Pain in thoracic spine: Secondary | ICD-10-CM | POA: Insufficient documentation

## 2016-10-04 DIAGNOSIS — Z5181 Encounter for therapeutic drug level monitoring: Secondary | ICD-10-CM | POA: Diagnosis not present

## 2016-10-04 DIAGNOSIS — I890 Lymphedema, not elsewhere classified: Secondary | ICD-10-CM | POA: Insufficient documentation

## 2016-10-04 DIAGNOSIS — G243 Spasmodic torticollis: Secondary | ICD-10-CM | POA: Diagnosis not present

## 2016-10-04 DIAGNOSIS — G893 Neoplasm related pain (acute) (chronic): Secondary | ICD-10-CM

## 2016-10-04 DIAGNOSIS — G894 Chronic pain syndrome: Secondary | ICD-10-CM

## 2016-10-04 DIAGNOSIS — Z79899 Other long term (current) drug therapy: Secondary | ICD-10-CM | POA: Diagnosis not present

## 2016-10-04 MED ORDER — OXYCODONE HCL 5 MG PO TABS: 5.0000 mg | ORAL_TABLET | Freq: Four times a day (QID) | ORAL | 0 refills | Status: DC | PRN

## 2016-10-04 NOTE — Progress Notes (Signed)
  Botulinum toxin injection for cervical dystonia CPT code [phone] Diagnosis code G 24.3 Indication is cervical dystonia that has not responded to conservative care and interferes with activities of daily living as well as cervical range of motion. Chronic cervical pain not relieved by other treatments.  Informed consent was obtained after describing risks and benefits of the procedure with the patient this included bleeding bruising and infection The patient elects to proceed and has given Written consent.  REMS form completed Right Trapezius, 50 Right levator, 75 units Right sternocleidomastoid, 25units Right platysma 25 units Right scalene 25 units Patient placed in a seated position A 27-gauge 1 inch needle electrode was used to guide the injection under EMG guidance.   All injections done after negative drawback for blood. Patient tolerated procedure well. Post procedure instructions given. Follow up appointment made

## 2016-10-04 NOTE — Progress Notes (Signed)
Patient ID: Karen Hopkins, female   DOB: 1975/10/13, 41 y.o.   MRN: [id number] Reviewed: Agree with Dr. Graylin Shiver documentation and management.

## 2016-10-04 NOTE — Patient Instructions (Signed)

## 2016-10-05 ENCOUNTER — Ambulatory Visit (HOSPITAL_BASED_OUTPATIENT_CLINIC_OR_DEPARTMENT_OTHER): Payer: Medicare Other

## 2016-10-05 ENCOUNTER — Other Ambulatory Visit (HOSPITAL_BASED_OUTPATIENT_CLINIC_OR_DEPARTMENT_OTHER): Payer: Medicare Other

## 2016-10-05 VITALS — BP 106/71 | HR 96 | Temp 97.8°F | Resp 18

## 2016-10-05 DIAGNOSIS — Z170 Estrogen receptor positive status [ER+]: Principal | ICD-10-CM

## 2016-10-05 DIAGNOSIS — C50811 Malignant neoplasm of overlapping sites of right female breast: Secondary | ICD-10-CM | POA: Diagnosis not present

## 2016-10-05 DIAGNOSIS — Z5111 Encounter for antineoplastic chemotherapy: Secondary | ICD-10-CM

## 2016-10-05 DIAGNOSIS — C50911 Malignant neoplasm of unspecified site of right female breast: Secondary | ICD-10-CM

## 2016-10-05 LAB — COMPREHENSIVE METABOLIC PANEL
ALBUMIN: 3.6 g/dL (ref 3.5–5.0)
ALK PHOS: 108 U/L (ref 40–150)
ALT: 12 U/L (ref 0–55)
AST: 11 U/L (ref 5–34)
Anion Gap: 10 mEq/L (ref 3–11)
BUN: 9 mg/dL (ref 7.0–26.0)
CALCIUM: 9.7 mg/dL (ref 8.4–10.4)
CO2: 27 mEq/L (ref 22–29)
CREATININE: 0.9 mg/dL (ref 0.6–1.1)
Chloride: 107 mEq/L (ref 98–109)
EGFR: 89 mL/min/{1.73_m2} — ABNORMAL LOW (ref >90)
Glucose: 103 mg/dl (ref 70–140)
POTASSIUM: 3.3 meq/L — AB (ref 3.5–5.1)
Sodium: 143 mEq/L (ref 136–145)
Total Bilirubin: 0.38 mg/dL (ref 0.20–1.20)
Total Protein: 7.3 g/dL (ref 6.4–8.3)

## 2016-10-05 LAB — CBC WITH DIFFERENTIAL/PLATELET
BASO%: 0.3 % (ref 0.0–2.0)
BASOS ABS: 0 10*3/uL (ref 0.0–0.1)
EOS%: 6.6 % (ref 0.0–7.0)
Eosinophils Absolute: 0.6 10*3/uL — ABNORMAL HIGH (ref 0.0–0.5)
HEMATOCRIT: 39.6 % (ref 34.8–46.6)
HEMOGLOBIN: 13 g/dL (ref 11.6–15.9)
LYMPH#: 3.1 10*3/uL (ref 0.9–3.3)
LYMPH%: 35.6 % (ref 14.0–49.7)
MCH: 28.2 pg (ref 25.1–34.0)
MCHC: 32.9 g/dL (ref 31.5–36.0)
MCV: 85.9 fL (ref 79.5–101.0)
MONO#: 0.6 10*3/uL (ref 0.1–0.9)
MONO%: 6.7 % (ref 0.0–14.0)
NEUT#: 4.4 10*3/uL (ref 1.5–6.5)
NEUT%: 50.8 % (ref 38.4–76.8)
Platelets: 305 10*3/uL (ref 145–400)
RBC: 4.61 10*6/uL (ref 3.70–5.45)
RDW: 15.5 % — AB (ref 11.2–14.5)
WBC: 8.6 10*3/uL (ref 3.9–10.3)

## 2016-10-05 MED ORDER — GOSERELIN ACETATE 3.6 MG ~~LOC~~ IMPL
3.6000 mg | DRUG_IMPLANT | Freq: Once | SUBCUTANEOUS | Status: AC
  Administered 2016-10-05: 3.6 mg via SUBCUTANEOUS
  Filled 2016-10-05: qty 3.6

## 2016-10-05 NOTE — Patient Instructions (Signed)
Goserelin injection What is this medicine? GOSERELIN (GOE se rel in) is similar to a hormone found in the body. It lowers the amount of sex hormones that the body makes. Men will have lower testosterone levels and women will have lower estrogen levels while taking this medicine. In men, this medicine is used to treat prostate cancer; the injection is either given once per month or once every 12 weeks. A once per month injection (only) is used to treat women with endometriosis, dysfunctional uterine bleeding, or advanced breast cancer. This medicine may be used for other purposes; ask your health care provider or pharmacist if you have questions. What should I tell my health care provider before I take this medicine? They need to know if you have any of these conditions (some only apply to women): -diabetes -heart disease or previous heart attack -high blood pressure -high cholesterol -kidney disease -osteoporosis or low bone density -problems passing urine -spinal cord injury -stroke -tobacco smoker -an unusual or allergic reaction to goserelin, hormone therapy, other medicines, foods, dyes, or preservatives -pregnant or trying to get pregnant -breast-feeding How should I use this medicine? This medicine is for injection under the skin. It is given by a health care professional in a hospital or clinic setting. Men receive this injection once every 4 weeks or once every 12 weeks. Women will only receive the once every 4 weeks injection. Talk to your pediatrician regarding the use of this medicine in children. Special care may be needed. Overdosage: If you think you have taken too much of this medicine contact a poison control center or emergency room at once. NOTE: This medicine is only for you. Do not share this medicine with others. What if I miss a dose? It is important not to miss your dose. Call your doctor or health care professional if you are unable to keep an appointment. What may  interact with this medicine? -female hormones like estrogen -herbal or dietary supplements like black cohosh, chasteberry, or DHEA -female hormones like testosterone -prasterone This list may not describe all possible interactions. Give your health care provider a list of all the medicines, herbs, non-prescription drugs, or dietary supplements you use. Also tell them if you smoke, drink alcohol, or use illegal drugs. Some items may interact with your medicine. What should I watch for while using this medicine? Visit your doctor or health care professional for regular checks on your progress. Your symptoms may appear to get worse during the first weeks of this therapy. Tell your doctor or healthcare professional if your symptoms do not start to get better or if they get worse after this time. Your bones may get weaker if you take this medicine for a long time. If you smoke or frequently drink alcohol you may increase your risk of bone loss. A family history of osteoporosis, chronic use of drugs for seizures (convulsions), or corticosteroids can also increase your risk of bone loss. Talk to your doctor about how to keep your bones strong. This medicine should stop regular monthly menstration in women. Tell your doctor if you continue to menstrate. Women should not become pregnant while taking this medicine or for 12 weeks after stopping this medicine. Women should inform their doctor if they wish to become pregnant or think they might be pregnant. There is a potential for serious side effects to an unborn child. Talk to your health care professional or pharmacist for more information. Do not breast-feed an infant while taking this medicine. Men should   inform their doctors if they wish to father a child. This medicine may lower sperm counts. Talk to your health care professional or pharmacist for more information. What side effects may I notice from receiving this medicine? Side effects that you should  report to your doctor or health care professional as soon as possible: -allergic reactions like skin rash, itching or hives, swelling of the face, lips, or tongue -bone pain -breathing problems -changes in vision -chest pain -feeling faint or lightheaded, falls -fever, chills -pain, swelling, warmth in the leg -pain, tingling, numbness in the hands or feet -signs and symptoms of low blood pressure like dizziness; feeling faint or lightheaded, falls; unusually weak or tired -stomach pain -swelling of the ankles, feet, hands -trouble passing urine or change in the amount of urine -unusually high or low blood pressure -unusually weak or tired Side effects that usually do not require medical attention (report to your doctor or health care professional if they continue or are bothersome): -change in sex drive or performance -changes in breast size in both males and females -changes in emotions or moods -headache -hot flashes -irritation at site where injected -loss of appetite -skin problems like acne, dry skin -vaginal dryness This list may not describe all possible side effects. Call your doctor for medical advice about side effects. You may report side effects to FDA at 1-800-FDA-1088. Where should I keep my medicine? This drug is given in a hospital or clinic and will not be stored at home. NOTE: This sheet is a summary. It may not cover all possible information. If you have questions about this medicine, talk to your doctor, pharmacist, or health care provider.    2016, Elsevier/Gold Standard. (2013-07-31 11:10:35)  

## 2016-10-07 ENCOUNTER — Other Ambulatory Visit: Payer: Self-pay | Admitting: Cardiovascular Disease

## 2016-10-07 NOTE — Telephone Encounter (Signed)
Will you please advise for refill, Thanks!

## 2016-10-09 LAB — TOXASSURE SELECT,+ANTIDEPR,UR

## 2016-10-11 ENCOUNTER — Telehealth: Payer: Self-pay | Admitting: Family Medicine

## 2016-10-11 NOTE — Telephone Encounter (Signed)
[employer]: provential is not covered but pro air is.    Pharmacy states a new RX needs to be issued

## 2016-10-12 ENCOUNTER — Telehealth: Payer: Self-pay | Admitting: *Deleted

## 2016-10-12 MED ORDER — ALBUTEROL SULFATE HFA 108 (90 BASE) MCG/ACT IN AERS: 2.0000 | INHALATION_SPRAY | Freq: Four times a day (QID) | RESPIRATORY_TRACT | 3 refills | Status: DC | PRN

## 2016-10-12 NOTE — Telephone Encounter (Signed)
Urine drug screen for this encounter is consistent for prescribed medication.  We are not prescribing alprazolam.

## 2016-10-12 NOTE — Telephone Encounter (Signed)
rx sent Liberti Appleton J Ephriam Turman, MD  

## 2016-10-21 ENCOUNTER — Ambulatory Visit: Payer: Medicare Other | Admitting: Pharmacist

## 2016-10-21 ENCOUNTER — Ambulatory Visit
Admission: RE | Admit: 2016-10-21 | Discharge: 2016-10-21 | Disposition: A | Discharge status: Home / Self Care | Payer: Medicare Other | Source: Ambulatory Visit | Attending: Oncology | Admitting: Oncology

## 2016-10-21 DIAGNOSIS — C50811 Malignant neoplasm of overlapping sites of right female breast: Secondary | ICD-10-CM

## 2016-10-21 DIAGNOSIS — Z170 Estrogen receptor positive status [ER+]: Principal | ICD-10-CM

## 2016-10-21 DIAGNOSIS — Z1231 Encounter for screening mammogram for malignant neoplasm of breast: Secondary | ICD-10-CM | POA: Diagnosis not present

## 2016-10-21 HISTORY — DX: Personal history of irradiation: Z92.3

## 2016-10-21 HISTORY — DX: Personal history of antineoplastic chemotherapy: Z92.21

## 2016-10-25 ENCOUNTER — Telehealth: Payer: Self-pay | Admitting: Registered Nurse

## 2016-10-25 NOTE — Telephone Encounter (Signed)
On 10/22/2016 the  [place] was reviewed no conflict was seen on the [place] with multiple prescribers. Ms. Rochette has a signed narcotic contract with our office. If there were any discrepancies this would have been reported to her physician.

## 2016-10-27 ENCOUNTER — Ambulatory Visit (INDEPENDENT_AMBULATORY_CARE_PROVIDER_SITE_OTHER): Payer: Medicare Other | Admitting: Neurology

## 2016-10-27 ENCOUNTER — Encounter: Payer: Self-pay | Admitting: Neurology

## 2016-10-27 VITALS — BP 111/74 | HR 100 | Ht 64.0 in | Wt 240.2 lb

## 2016-10-27 DIAGNOSIS — G43009 Migraine without aura, not intractable, without status migrainosus: Secondary | ICD-10-CM | POA: Diagnosis not present

## 2016-10-27 MED ORDER — TOPIRAMATE 200 MG PO TABS: 200.0000 mg | ORAL_TABLET | Freq: Every day | ORAL | 12 refills | Status: DC

## 2016-10-27 NOTE — Patient Instructions (Signed)
Remember to drink plenty of fluid, eat healthy meals and do not skip any meals. Try to eat protein with a every meal and eat a healthy snack such as fruit or nuts in between meals. Try to keep a regular sleep-wake schedule and try to exercise daily, particularly in the form of walking, 20-30 minutes a day, if you can.   As far as your medications are concerned, I would like to suggest Continue Topiramate  I would like to see you back in 1 year, sooner if we need to. Please call us with any interim questions, concerns, problems, updates or refill requests.    Our phone number is [phone]. We also have an after hours call service for urgent matters and there is a physician on-call for urgent questions. For any emergencies you know to call 911 or go to the nearest emergency room

## 2016-10-27 NOTE — Progress Notes (Signed)
[id number]    Provider:  Dr Jaynee Eagles Referring Provider: [name], MD Primary Care Physician:  [name], MD  Interval history 10/27/2016: No more headaches, doing well on the Topiramate. No issues whatsoever. No side effects to the topiramate. She is doing extremely well. She has also lost weight which is good. She feels much better. She follows with cardiology. No vision changes. Continue topiramate 200 mg a day.  Interval History 06/29/2016; Here for follow up of headaches. The headaches have significantly improved. No side from the Topiramate. She has lost weight which is good. She feels much better. She follows with cardiology. She is feeling very well. No vision changes. Will repeat MRI brain in April. Will increase Topiramate to 200mg  a day.   Interval history: 41 y.o. female here as a referral from Dr. Ardelia Mems for intractable headaches. PMHx of right breast carcinoma, Status post modified radical mastectomy with postoperative radiation therapy In 2014, HTN, diabetes, migraine, depression, anxiety. She has chronic migraines. MRI of the brain showed a focus of FLAIR hyperintensity, right frontal subcortical white matter possibly a nonspecific focus of ischemic demyelination, or possible post treatment effect. Also seen was an Empty sella.She is on ASA 81mg  for stroke prevention. She went for surgery and saw neuro-ophthalmology at [facility] for enlarged lacrimal glands, biopsy was performed and biopsy was unrevealing and steroid injection swere placed into both eyes. She saw cardiology for evaluation after echocardiogram. She is being managed by cardiology for her HTN. Her migraines are improved she still have some headaches around the eyes. No vision changes, no blurry vision, no hearing changes, no side effects to the Topiramate. She has 3 boys no more children.   Extensive lab workup including the following was unremarkable: hiv, rpr, sickle cell,  c3/c4 and complement(slughtly elevated), pan-anca, TSH, hemoglobinopathy, cardiolipin antibodies, prothrombin gene, fV leiden, homocysteine, b2-glycoprotein, lupus, protein c, antithrombin c, ldl (55), ace  Protein s activity and total were slightly elevated. Sed rate slightly elevated. Hgba1c 6.4.   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   Ultrasound carotids normal.   Mri brain also with partially empty sella could be intracranial hypertension  Cardiac event monitor normal.   Repeat mri orbits 11/2015:   FINDINGS:  Periorbital soft tissues: Prominent bilateral lacrimal glands which measure upper limits of normal versus mildly enlarged. No focal mass lesion is seen within either lacrimal gland. Globes: Slightly misshapen and elongated appearance of the globes bilaterally. Optic nerves: Normal size, contour, and signal bilaterally. Extraocular muscles: Symmetrical, normal size and signal. Contrast: No abnormal enhancement to suggest mass or infection.  Addendum: Echo cardiogram read: "- Global LV longitudinal strain -16.2% is mildly reduced and lower than on the previous study (-20%)". Discussed with Dr. Orene Desanctis, will refer to Dr. Haroldine Laws in Cardiology thanks.  ELF:YBOFBP D Personis a 41 y.o.femalehere as a referral from Dr. Rayburn Go intractable headaches. PMHx of right breast carcinoma, Status post modified radical mastectomy with postoperative radiation therapy In 2014, HTN, diabetes, migraine, depression, anxiety. She has had headaches for years. Since she was a teenager. Has to go into a dark room. The headache is worse with laying down when it is very bad. She has to sit very still. She wakes up with the headaches. Not taking tylenol, advil or naproxen daily no overuse headache. Headache starts on the right around the eye, throbbing, pulsing, and spreads to the other side  of the head. She has nausea, no vomiting. She has photophobia, phonophobia. She wakes up with headaches. Snores a lot, kids tell her she excessively snores. She is very tired during the day. She also has cervical dystonia and just received botox. She has a headache several times a week and can last up to 3 days each. At least 15-20 headaches a month and at least 8 are migrainous. This frequency for over a year. Can be severe 10/10 pain. She can be excessively tired during the week but she had a sleep test for OSA neg 06/23/2011.  Reviewed notes, labs and imaging from outside physicians, which showed:   FINDINGS: No evidence for acute infarction, hemorrhage, mass lesion, hydrocephalus, or extra-axial fluid. Normal cerebral volume.  Subcentimeter focus of FLAIR hyperintensity, RIGHT frontal subcortical white matter, image 16, favored to represent nonspecific focus of ischemic demyelination, or possible post treatment effect. Empty sella. No tonsillar herniation. No osseous lesion. Flow voids are maintained. No chronic hemorrhage.  Post infusion, no abnormal enhancement of brain or meninges.Extracranial soft tissues unremarkable. Shotty cervical adenopathy.  Compared with priors, it is possible that the RIGHT frontal subcortical lesion can be faintly visualized in retrospect. Certainly there is no acute abnormality in that region in 2014.  IMPRESSION: No evidence for metastatic disease to the brain or surrounding structures.  Subcentimeter focus of FLAIR hyperintensity RIGHT frontal subcortical white matter, nonspecific, see discussion above. No evidence for enhancement or restricted diffusion to suggest acuity. Suspect chronic focus of ischemic demyelination or post treatment effect.  CMP unremarkable 04/10/2015  Review of Systems: Patient complains of symptoms per HPI as well as the following symptoms: Constipation, memory loss, numbness, joint pain, aching muscles, walking difficulty,  neck pain, neck stiffness, depression, anxious. Pertinent negatives per HPI. All others negative.   Social History   Social History  . Marital status: Single    Spouse name: N/A  . Number of children: 3  . Years of education: 52   Occupational History  . Disability     Social History Main Topics  . Smoking status: Former Smoker    Packs/day: 0.25    Years: 5.00    Types: Cigarettes    Start date: 06/07/2002    Quit date: 04/26/2010  . Smokeless tobacco: Never Used  . Alcohol use No  . Drug use: No  . Sexual activity: Not Currently    Birth control/ protection: Surgical   Other Topics Concern  . Not on file   Social History Narrative   Lives with kids   Caffeine use: none     Family History  Problem Relation Age of Onset  . Hypertension Mother   . Diabetes type II Father   . Prostate cancer Father   . Stroke Neg Hx     Past Medical History:  Diagnosis Date  . Anxiety   . Cancer ([place])   . Depression   . Essential hypertension 11/05/2015  . History of breast cancer 2011   right  . History of chemotherapy 2011  . History of radiation therapy 2011  . Hypertension    under control with med., has been on med. x 2 yr.  . Lymphedema of arm    right; no BP or puncture to right arm  . Personal history of chemotherapy 09/16/2009   rt breast  . Personal history of radiation therapy 05/2010   rt breast  . Pneumonia   . Seasonal allergies   . Sinus headache     Past  Surgical History:  Procedure Laterality Date  . BREAST BIOPSY Right 08/26/2009  . BREAST REDUCTION SURGERY Left 08/23/2013   Procedure: LEFT BREAST REDUCTION  ;  Surgeon: Theodoro Kos, DO;  Location: [place];  Service: Plastics;  Laterality: Left;  . CESAREAN SECTION  07/15/2001; 03/18/2004; 11/21/2008  . LATISSIMUS FLAP TO BREAST Right 03/12/2013   Procedure: RIGHT BREAST LATISSIMUS FLAP WITH EXPANDER PLACEMENT;  Surgeon: Theodoro Kos, DO;  Location: [place];  Service: Plastics;   Laterality: Right;  . LIPOSUCTION Bilateral 08/23/2013   Procedure: LIPOSUCTION;  Surgeon: Theodoro Kos, DO;  Location: [place];  Service: Plastics;  Laterality: Bilateral;  . MASTECTOMY Right 2011  . MODIFIED RADICAL MASTECTOMY Right 03/11/2010  . NASAL SEPTUM SURGERY    . PORT-A-CATH REMOVAL Left 12/01/2010  . PORTACATH PLACEMENT Left 09/12/2009  . REDUCTION MAMMAPLASTY Left 08/2013  . REMOVAL OF TISSUE EXPANDER AND PLACEMENT OF IMPLANT Right 08/23/2013   Procedure: REMOVAL RIGHT TISSUE EXPANDER AND PLACEMENT OF IMPLANT TO RIGHT BREAST ;  Surgeon: Theodoro Kos, DO;  Location: [place];  Service: Plastics;  Laterality: Right;  . SUPRA-UMBILICAL HERNIA  [id number]  . TUBAL LIGATION  11/21/2008    Current Outpatient Prescriptions  Medication Sig Dispense Refill  . albuterol (PROAIR HFA) 108 (90 Base) MCG/ACT inhaler Inhale 2 puffs into the lungs every 6 (six) hours as needed for wheezing or shortness of breath. 18 g 3  . albuterol (PROVENTIL) (2.5 MG/3ML) 0.083% nebulizer solution Take 3 mLs (2.5 mg total) by nebulization every 6 (six) hours as needed for wheezing or shortness of breath. 75 mL 3  . ALPRAZolam (ALPRAZOLAM XR) 1 MG 24 hr tablet Take 1 tablet (1 mg total) by mouth daily. 30 tablet 2  . anastrozole (ARIMIDEX) 1 MG tablet Take 1 tablet (1 mg total) by mouth daily. 90 tablet 4  . carvedilol (COREG) 6.25 MG tablet Take 1 tablet (6.25 mg total) by mouth 2 (two) times daily with a meal. 60 tablet 5  . DULoxetine (CYMBALTA) 30 MG capsule Take 90 mg by mouth daily.     . fluticasone (FLOVENT HFA) 220 MCG/ACT inhaler Inhale 2 puffs into the lungs 2 (two) times daily. 1 Inhaler 2  . furosemide (LASIX) 40 MG tablet Take 1 tablet (40 mg total) by mouth daily. (Patient taking differently: Take 80 mg by mouth 2 (two) times daily. ) 90 tablet 3  . goserelin (ZOLADEX) 3.6 MG injection Inject 3.6 mg into the skin every 28 (twenty-eight) days.    . metFORMIN (GLUCOPHAGE)  500 MG tablet TAKE ONE (1) TABLET BY MOUTH TWO (2) TIMES DAILY WITH A MEAL 60 tablet 5  . metoCLOPramide (REGLAN) 10 MG tablet Take 1 tablet (10 mg total) by mouth every 8 (eight) hours as needed. 60 tablet 1  . oxyCODONE (OXY IR/ROXICODONE) 5 MG immediate release tablet Take 1 tablet (5 mg total) by mouth every 6 (six) hours as needed for severe pain. 120 tablet 0  . polyethylene glycol powder (GLYCOLAX) powder Take 17 g by mouth daily. 500 g 5  . potassium chloride (K-DUR) 10 MEQ tablet Take 2 tablets (20 mEq total) by mouth 3 (three) times daily. 180 tablet 1  . tiZANidine (ZANAFLEX) 4 MG tablet TAKE ONE (1) TABLET BY MOUTH 3 TIMES DAILY 90 tablet 3  . topiramate (TOPAMAX) 200 MG tablet Take 1 tablet (200 mg total) by mouth at bedtime. 30 tablet 12  . traZODone (DESYREL) 100 MG tablet     .  triamcinolone ointment (KENALOG) 0.5 % Apply 1 application topically 2 (two) times daily. 30 g 0   No current facility-administered medications for this visit.     Allergies as of 10/27/2016 - Review Complete 10/27/2016  Allergen Reaction Noted  . Penicillins Swelling 04/14/2010  . Lyrica [pregabalin] Nausea Only 01/19/2012  . Meloxicam Nausea Only 12/22/2011  . Robaxin [methocarbamol] Nausea Only 12/22/2011    Vitals: BP 111/74   Pulse 100   Ht 5\' 4"  (1.626 m)   Wt 240 lb 3.2 oz (109 kg)   BMI 41.23 kg/m  Last Weight:  Wt Readings from Last 1 Encounters:  10/27/16 240 lb 3.2 oz (109 kg)   Last Height:   Ht Readings from Last 1 Encounters:  10/27/16 5\' 4"  (1.626 m)    Physical exam: Exam: Gen: NAD, conversant, well nourised, morbidly obese, well groomed  CV: RRR, no MRG. No Carotid Bruits. No peripheral edema, warm, nontender Eyes: Conjunctivae clear without exudates or hemorrhage  Neuro: Detailed Neurologic Exam  Speech: Speech is normal; fluent and spontaneous with normal comprehension.  Cognition: The patient is oriented to [place], place, and time;    recent and remote memory intact;  language fluent;  normal attention, concentration,  fund of knowledge Cranial Nerves: The pupils are equal, round, and reactive to light. The fundi are normal. Visual fields are full to finger confrontation. Extraocular movements are intact. Trigeminal sensation is intact and the muscles of mastication are normal. The face is symmetric. The palate elevates in the midline. Hearing intact. Voice is normal. Shoulder shrug is normal. The tongue has normal motion without fasciculations.   Coordination: Normal finger to nose and heel to shin. Normal rapid alternating movements.   Gait: Heel-toe and tandem gait are normal.   Motor Observation: No asymmetry, no atrophy, and no involuntary movements noted. Tone: Normal muscle tone.   Posture: Posture is normal. normal erect  Strength: Strength is V/V in the upper and lower limbs.   Sensation: intact to LT  Reflex Exam:  DTR's: Deep tendon reflexes in the upper and lower extremities are normal bilaterally.  Toes: The toes are downgoing bilaterally.  Clonus: Clonus is absent.   Assessment/Plan:40 y.o. female here as a referral from Dr. Ardelia Mems for intractable headaches. PMHx of right breast carcinoma, Status post modified radical mastectomy with postoperative radiation therapy In 2014, HTN, diabetes, migraine, depression, anxiety. She was referred for chronic migraines. MRI of the brain showed a focus of FLAIR hyperintensity, right frontal subcortical white matter possibly a nonspecific focus of ischemic demyelination, or possible post treatment effect. Also seen was an Empty sella and enlarged lacrimal glands biopsy was negative for sarcoid.   She is doing well and her headaches have subsided on topiramate.  additional workup for evaluation of any risk factors for ischemic stroke due to subcortical t2 Extensive lab workup including the  following was unremarkable: hiv, rpr, sickle cell, c3/c4 and complement(slughtly elevated), pan-anca, TSH, hemoglobinopathy, cardiolipin antibodies, prothrombin gene, fV leiden, homocysteine, b2-glycoprotein, lupus, protein c, antithrombin c, ldl (55), ace.   Protein s activity and total were slightly elevated. Sed rate slightly elevated. Hgba1c 6.4.   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 Steroid injections performed. Biopsy unremarkable, rules out sarcoid and other etiologies.  We'll hold off on repeating MRI of the brain considering her exam is nonfocal and she is feeling exceptionally well.  Ultrasound carotids normal.   Cardiac  event monitor normal.  Continue Daily asa 81mg  Follow closely with pcp for management of vascular risk factors ( goals ldl <70, normotensive BP, hgba1c < 6.5).  Echo cardiogram read: "- Global LV longitudinal strain -16.2% is mildly reduced and lower than on the previous study (-20%)". Discussed with Dr. Orene Desanctis, Referred to Dr. Haroldine Laws thanks. Continue cardiology follow up.  Increase Topiramate to 200mg  at night for migraines.   Sarina Ill, MD  [facility] [address] [place], [place] [address]  Phone [phone] Fax [phone]  A total of 15 minutes was spent face-to-face with this patient. Over half this time was spent on counseling patient on the migraine, enlarged lacrimal glands diagnosis and different diagnostic and therapeutic options available.

## 2016-10-29 ENCOUNTER — Encounter: Payer: Medicare Other | Attending: Registered Nurse | Admitting: Registered Nurse

## 2016-10-29 DIAGNOSIS — I890 Lymphedema, not elsewhere classified: Secondary | ICD-10-CM | POA: Insufficient documentation

## 2016-10-29 DIAGNOSIS — M546 Pain in thoracic spine: Secondary | ICD-10-CM | POA: Insufficient documentation

## 2016-10-31 DIAGNOSIS — R062 Wheezing: Secondary | ICD-10-CM | POA: Diagnosis not present

## 2016-11-02 ENCOUNTER — Ambulatory Visit (HOSPITAL_BASED_OUTPATIENT_CLINIC_OR_DEPARTMENT_OTHER): Payer: Medicare Other

## 2016-11-02 ENCOUNTER — Other Ambulatory Visit (HOSPITAL_BASED_OUTPATIENT_CLINIC_OR_DEPARTMENT_OTHER): Payer: Medicare Other

## 2016-11-02 VITALS — BP 136/82 | HR 95 | Temp 97.5°F | Resp 18

## 2016-11-02 DIAGNOSIS — C50811 Malignant neoplasm of overlapping sites of right female breast: Secondary | ICD-10-CM

## 2016-11-02 DIAGNOSIS — Z5111 Encounter for antineoplastic chemotherapy: Secondary | ICD-10-CM | POA: Diagnosis not present

## 2016-11-02 DIAGNOSIS — Z170 Estrogen receptor positive status [ER+]: Principal | ICD-10-CM

## 2016-11-02 DIAGNOSIS — C50911 Malignant neoplasm of unspecified site of right female breast: Secondary | ICD-10-CM

## 2016-11-02 LAB — CBC WITH DIFFERENTIAL/PLATELET
BASO%: 0.5 % (ref 0.0–2.0)
BASOS ABS: 0 10*3/uL (ref 0.0–0.1)
EOS ABS: 1.1 10*3/uL — AB (ref 0.0–0.5)
EOS%: 12.5 % — ABNORMAL HIGH (ref 0.0–7.0)
HCT: 40.7 % (ref 34.8–46.6)
HEMOGLOBIN: 13.4 g/dL (ref 11.6–15.9)
LYMPH%: 34.4 % (ref 14.0–49.7)
MCH: 28 pg (ref 25.1–34.0)
MCHC: 32.9 g/dL (ref 31.5–36.0)
MCV: 85.1 fL (ref 79.5–101.0)
MONO#: 0.7 10*3/uL (ref 0.1–0.9)
MONO%: 8.3 % (ref 0.0–14.0)
NEUT%: 44.3 % (ref 38.4–76.8)
NEUTROS ABS: 3.8 10*3/uL (ref 1.5–6.5)
PLATELETS: 300 10*3/uL (ref 145–400)
RBC: 4.78 10*6/uL (ref 3.70–5.45)
RDW: 15.1 % — ABNORMAL HIGH (ref 11.2–14.5)
WBC: 8.7 10*3/uL (ref 3.9–10.3)
lymph#: 3 10*3/uL (ref 0.9–3.3)

## 2016-11-02 LAB — COMPREHENSIVE METABOLIC PANEL
ALBUMIN: 3.7 g/dL (ref 3.5–5.0)
ALK PHOS: 112 U/L (ref 40–150)
ALT: 12 U/L (ref 0–55)
ANION GAP: 6 meq/L (ref 3–11)
AST: 11 U/L (ref 5–34)
BUN: 8.6 mg/dL (ref 7.0–26.0)
CO2: 27 mEq/L (ref 22–29)
Calcium: 9.4 mg/dL (ref 8.4–10.4)
Chloride: 109 mEq/L (ref 98–109)
Creatinine: 0.9 mg/dL (ref 0.6–1.1)
EGFR: 89 mL/min/{1.73_m2} — AB (ref >90)
Glucose: 91 mg/dl (ref 70–140)
Potassium: 3.3 mEq/L — ABNORMAL LOW (ref 3.5–5.1)
Sodium: 142 mEq/L (ref 136–145)
TOTAL PROTEIN: 7.6 g/dL (ref 6.4–8.3)

## 2016-11-02 MED ORDER — GOSERELIN ACETATE 3.6 MG ~~LOC~~ IMPL
3.6000 mg | DRUG_IMPLANT | Freq: Once | SUBCUTANEOUS | Status: AC
  Administered 2016-11-02: 3.6 mg via SUBCUTANEOUS
  Filled 2016-11-02: qty 3.6

## 2016-11-02 NOTE — Patient Instructions (Signed)
Goserelin injection What is this medicine? GOSERELIN (GOE se rel in) is similar to a hormone found in the body. It lowers the amount of sex hormones that the body makes. Men will have lower testosterone levels and women will have lower estrogen levels while taking this medicine. In men, this medicine is used to treat prostate cancer; the injection is either given once per month or once every 12 weeks. A once per month injection (only) is used to treat women with endometriosis, dysfunctional uterine bleeding, or advanced breast cancer. This medicine may be used for other purposes; ask your health care provider or pharmacist if you have questions. What should I tell my health care provider before I take this medicine? They need to know if you have any of these conditions (some only apply to women): -diabetes -heart disease or previous heart attack -high blood pressure -high cholesterol -kidney disease -osteoporosis or low bone density -problems passing urine -spinal cord injury -stroke -tobacco smoker -an unusual or allergic reaction to goserelin, hormone therapy, other medicines, foods, dyes, or preservatives -pregnant or trying to get pregnant -breast-feeding How should I use this medicine? This medicine is for injection under the skin. It is given by a health care professional in a hospital or clinic setting. Men receive this injection once every 4 weeks or once every 12 weeks. Women will only receive the once every 4 weeks injection. Talk to your pediatrician regarding the use of this medicine in children. Special care may be needed. Overdosage: If you think you have taken too much of this medicine contact a poison control center or emergency room at once. NOTE: This medicine is only for you. Do not share this medicine with others. What if I miss a dose? It is important not to miss your dose. Call your doctor or health care professional if you are unable to keep an appointment. What may  interact with this medicine? -female hormones like estrogen -herbal or dietary supplements like black cohosh, chasteberry, or DHEA -female hormones like testosterone -prasterone This list may not describe all possible interactions. Give your health care provider a list of all the medicines, herbs, non-prescription drugs, or dietary supplements you use. Also tell them if you smoke, drink alcohol, or use illegal drugs. Some items may interact with your medicine. What should I watch for while using this medicine? Visit your doctor or health care professional for regular checks on your progress. Your symptoms may appear to get worse during the first weeks of this therapy. Tell your doctor or healthcare professional if your symptoms do not start to get better or if they get worse after this time. Your bones may get weaker if you take this medicine for a long time. If you smoke or frequently drink alcohol you may increase your risk of bone loss. A family history of osteoporosis, chronic use of drugs for seizures (convulsions), or corticosteroids can also increase your risk of bone loss. Talk to your doctor about how to keep your bones strong. This medicine should stop regular monthly menstration in women. Tell your doctor if you continue to menstrate. Women should not become pregnant while taking this medicine or for 12 weeks after stopping this medicine. Women should inform their doctor if they wish to become pregnant or think they might be pregnant. There is a potential for serious side effects to an unborn child. Talk to your health care professional or pharmacist for more information. Do not breast-feed an infant while taking this medicine. Men should   inform their doctors if they wish to father a child. This medicine may lower sperm counts. Talk to your health care professional or pharmacist for more information. What side effects may I notice from receiving this medicine? Side effects that you should  report to your doctor or health care professional as soon as possible: -allergic reactions like skin rash, itching or hives, swelling of the face, lips, or tongue -bone pain -breathing problems -changes in vision -chest pain -feeling faint or lightheaded, falls -fever, chills -pain, swelling, warmth in the leg -pain, tingling, numbness in the hands or feet -signs and symptoms of low blood pressure like dizziness; feeling faint or lightheaded, falls; unusually weak or tired -stomach pain -swelling of the ankles, feet, hands -trouble passing urine or change in the amount of urine -unusually high or low blood pressure -unusually weak or tired Side effects that usually do not require medical attention (report to your doctor or health care professional if they continue or are bothersome): -change in sex drive or performance -changes in breast size in both males and females -changes in emotions or moods -headache -hot flashes -irritation at site where injected -loss of appetite -skin problems like acne, dry skin -vaginal dryness This list may not describe all possible side effects. Call your doctor for medical advice about side effects. You may report side effects to FDA at 1-800-FDA-1088. Where should I keep my medicine? This drug is given in a hospital or clinic and will not be stored at home. NOTE: This sheet is a summary. It may not cover all possible information. If you have questions about this medicine, talk to your doctor, pharmacist, or health care provider.    2016, Elsevier/Gold Standard. (2013-07-31 11:10:35)  

## 2016-11-03 ENCOUNTER — Ambulatory Visit
Admission: RE | Admit: 2016-11-03 | Discharge: 2016-11-03 | Disposition: A | Discharge status: Home / Self Care | Payer: Medicare Other | Source: Ambulatory Visit | Attending: Registered Nurse | Admitting: Registered Nurse

## 2016-11-03 ENCOUNTER — Encounter: Payer: Self-pay | Admitting: Registered Nurse

## 2016-11-03 ENCOUNTER — Encounter (HOSPITAL_BASED_OUTPATIENT_CLINIC_OR_DEPARTMENT_OTHER): Payer: Medicare Other | Admitting: Registered Nurse

## 2016-11-03 VITALS — BP 107/73 | HR 98 | Resp 14

## 2016-11-03 DIAGNOSIS — M545 Low back pain, unspecified: Secondary | ICD-10-CM

## 2016-11-03 DIAGNOSIS — G894 Chronic pain syndrome: Secondary | ICD-10-CM

## 2016-11-03 DIAGNOSIS — G8929 Other chronic pain: Secondary | ICD-10-CM

## 2016-11-03 DIAGNOSIS — M1712 Unilateral primary osteoarthritis, left knee: Secondary | ICD-10-CM | POA: Diagnosis not present

## 2016-11-03 DIAGNOSIS — M25561 Pain in right knee: Secondary | ICD-10-CM | POA: Diagnosis not present

## 2016-11-03 DIAGNOSIS — G243 Spasmodic torticollis: Secondary | ICD-10-CM

## 2016-11-03 DIAGNOSIS — M25562 Pain in left knee: Secondary | ICD-10-CM | POA: Diagnosis not present

## 2016-11-03 DIAGNOSIS — M25551 Pain in right hip: Secondary | ICD-10-CM | POA: Diagnosis not present

## 2016-11-03 DIAGNOSIS — Z79899 Other long term (current) drug therapy: Secondary | ICD-10-CM | POA: Diagnosis not present

## 2016-11-03 DIAGNOSIS — Z5181 Encounter for therapeutic drug level monitoring: Secondary | ICD-10-CM

## 2016-11-03 DIAGNOSIS — M791 Myalgia: Secondary | ICD-10-CM | POA: Diagnosis not present

## 2016-11-03 DIAGNOSIS — M7918 Myalgia, other site: Secondary | ICD-10-CM

## 2016-11-03 DIAGNOSIS — M546 Pain in thoracic spine: Secondary | ICD-10-CM | POA: Diagnosis not present

## 2016-11-03 DIAGNOSIS — I890 Lymphedema, not elsewhere classified: Secondary | ICD-10-CM | POA: Diagnosis not present

## 2016-11-03 DIAGNOSIS — M1711 Unilateral primary osteoarthritis, right knee: Secondary | ICD-10-CM | POA: Diagnosis not present

## 2016-11-03 DIAGNOSIS — M1611 Unilateral primary osteoarthritis, right hip: Secondary | ICD-10-CM | POA: Diagnosis not present

## 2016-11-03 MED ORDER — TIZANIDINE HCL 4 MG PO TABS: ORAL_TABLET | ORAL | 3 refills | Status: DC

## 2016-11-03 MED ORDER — OXYCODONE HCL 5 MG PO TABS: 5.0000 mg | ORAL_TABLET | Freq: Four times a day (QID) | ORAL | 0 refills | Status: DC | PRN

## 2016-11-03 NOTE — Progress Notes (Signed)
Subjective:    Patient ID: Karen Hopkins, female    DOB: [birth year], 41 y.o.   MRN: [id number]  HPI: [name] is a 41year old female who returns for follow up appointmentfor chronic pain and medication refill. She states her pain is located in her neck radiating into her right shoulder, mid-back mainly right side, lower back pain, right hip and bilateral knee pain..[name] states she woke up yesterday unable to bear weight on her right leg with increase intensity of right hip pain, she denies falling. Will order X-ray she verbalizes understanding. She rates her pain 8.  Her current exercise regime is walking and performing stretching exercises. [name] last UDS was performed on 10/04/2016, it was consistent.   Pain Inventory Average Pain 7 Pain Right Now 8 My pain is sharp, burning, dull, stabbing, tingling and aching  In the last 24 hours, has pain interfered with the following? General activity 7 Relation with others 7 Enjoyment of life 7 What TIME of day is your pain at its worst? all Sleep (in general) Poor  Pain is worse with: walking, bending, inactivity, standing and some activites Pain improves with: medication Relief from Meds: 8  Mobility walk without assistance how many minutes can you walk? 10 ability to climb steps?  yes do you drive?  yes  Function disabled: date disabled . I need assistance with the following:  meal prep and household duties  Neuro/Psych numbness tingling trouble walking spasms depression anxiety  Prior Studies Any changes since last visit?  no  Physicians involved in your care Any changes since last visit?  no   Family History  Problem Relation Age of Onset  . Hypertension Mother   . Diabetes type II Father   . Prostate cancer Father   . Stroke Neg Hx    Social History   Social History  . Marital status: Single    Spouse name: N/A  . Number of children: 3  . Years of education: 39   Occupational  History  . Disability     Social History Main Topics  . Smoking status: Former Smoker    Packs/day: 0.25    Years: 5.00    Types: Cigarettes    Start date: 06/07/2002    Quit date: 04/26/2010  . Smokeless tobacco: Never Used  . Alcohol use No  . Drug use: No  . Sexual activity: Not Currently    Birth control/ protection: Surgical   Other Topics Concern  . None   Social History Narrative   Lives with kids   Caffeine use: none    Past Surgical History:  Procedure Laterality Date  . BREAST BIOPSY Right 08/26/2009  . BREAST REDUCTION SURGERY Left 08/23/2013   Procedure: LEFT BREAST REDUCTION  ;  Surgeon: Theodoro Kos, DO;  Location: [facility];  Service: Plastics;  Laterality: Left;  . CESAREAN SECTION  07/15/2001; 03/18/2004; 11/21/2008  . LATISSIMUS FLAP TO BREAST Right 03/12/2013   Procedure: RIGHT BREAST LATISSIMUS FLAP WITH EXPANDER PLACEMENT;  Surgeon: Theodoro Kos, DO;  Location: [facility];  Service: Plastics;  Laterality: Right;  . LIPOSUCTION Bilateral 08/23/2013   Procedure: LIPOSUCTION;  Surgeon: Theodoro Kos, DO;  Location: [facility];  Service: Plastics;  Laterality: Bilateral;  . MASTECTOMY Right 2011  . MODIFIED RADICAL MASTECTOMY Right 03/11/2010  . NASAL SEPTUM SURGERY    . PORT-A-CATH REMOVAL Left 12/01/2010  . PORTACATH PLACEMENT Left 09/12/2009  . REDUCTION MAMMAPLASTY Left 08/2013  .  REMOVAL OF TISSUE EXPANDER AND PLACEMENT OF IMPLANT Right 08/23/2013   Procedure: REMOVAL RIGHT TISSUE EXPANDER AND PLACEMENT OF IMPLANT TO RIGHT BREAST ;  Surgeon: Theodoro Kos, DO;  Location: [place];  Service: Plastics;  Laterality: Right;  . SUPRA-UMBILICAL HERNIA  [id number]  . TUBAL LIGATION  11/21/2008   Past Medical History:  Diagnosis Date  . Anxiety   . Cancer ([place])   . Depression   . Essential hypertension 11/05/2015  . History of breast cancer 2011   right  . History of chemotherapy 2011  . History of radiation therapy 2011  .  Hypertension    under control with med., has been on med. x 2 yr.  . Lymphedema of arm    right; no BP or puncture to right arm  . Personal history of chemotherapy 09/16/2009   rt breast  . Personal history of radiation therapy 05/2010   rt breast  . Pneumonia   . Seasonal allergies   . Sinus headache    BP 107/73 (BP Location: Left Arm, Patient Position: Sitting, Cuff Size: Large)   Pulse 98   Resp 14   SpO2 96%   Opioid Risk Score:   Fall Risk Score:  `1  Depression screen PHQ 2/9  Depression screen [facility] [place] 2/9 08/31/2016 07/22/2016 07/20/2016 07/08/2016 08/04/2015 06/25/2015 03/04/2015  Decreased Interest 0 0 0 0 0 0 0  Down, Depressed, Hopeless 0 0 0 0 0 0 0  PHQ - 2 Score 0 0 0 0 0 0 0  Some recent data might be hidden    Review of Systems  Constitutional: Negative.   HENT: Negative.   Eyes: Negative.   Respiratory: Negative.   Cardiovascular: Negative.   Gastrointestinal: Negative.   Endocrine: Negative.   Genitourinary: Negative.   Musculoskeletal: Positive for arthralgias, back pain and gait problem.       Spasms  Skin: Negative.   Allergic/Immunologic: Negative.   Neurological: Positive for numbness.       Tingling  Hematological: Negative.   Psychiatric/Behavioral: The patient is nervous/anxious.   All other systems reviewed and are negative.      Objective:   Physical Exam  Constitutional: She is oriented to [place], place, and time. She appears well-developed and well-nourished.  HENT:  Head: Normocephalic and atraumatic.  Neck: Normal range of motion. Neck supple.  Cardiovascular: Normal rate and regular rhythm.   Pulmonary/Chest: Effort normal and breath sounds normal.  Musculoskeletal:  Normal Muscle Bulk and Muscle Testing Reveals: Upper Extremities: Right: Decreased ROM 90 Degrees and Muscle Strength 4/5 Right AC Joint Tenderness Left: Full ROM and Muscle Strength 5/5 Thoracic Paraspinal Tenderness: T-1-T-7 Mainly Right Side Lumbar Paraspinal  Tenderness: L-3-L-5 Lower Extremities: Full ROM and Muscle Strength 5/5 Arises from Table Slowly Antalgic Gait    Neurological: She is alert and oriented to [place], place, and time.  Skin: Skin is warm and dry.  Psychiatric: She has a normal mood and affect.  Nursing note and vitals reviewed.         Assessment & Plan:  1. Acute Right Hip Pain: RX: Right Hip X-ray 2. Bilateral Chronic Knee Pain: RX: Bilateral Knee X-ray 3.Myofascial pain syndrome chronic postoperative, as well as post radiation: S/P Breast Reconstruction Surgery x 2. Lymphedema: Continue with compression sleeve. Continue to Monitor. 11/03/2016 Refilled: Oxycodone 5 mg one tablet every 6 hours as needed for severe pain.# 120.Continue HEP. We will continue the opioid monitoring program, this consists of regular clinic visits, examinations, urine drug  screen, pill counts as well as use of [place] Controlled Substance Reporting System. 4. Neoplasm Related Pain: Continue Current Medication Regime. 11/03/2016 5. Obesity: Losing weight continue with Healthy Diet and Exercise Regime. 11/03/2016 6. Muscle Spasms: Continue Tizanidine. 11/03/2016 7. Depression: Continue Cymbalta and Counseling at [facility]. 11/03/2016 8. Cervical Dystonia: S/P Botox injection. Continue to Monitor  9. Lower Back Pain: Continue HEP as Tolerated. Continue to Monitor.   20 minutes of face to face patient care time was spent during this visit. All questions were encouraged and answered.   F/U in 1 month

## 2016-11-08 ENCOUNTER — Telehealth: Payer: Self-pay | Admitting: Registered Nurse

## 2016-11-08 MED ORDER — DICLOFENAC SODIUM 1 % TD GEL: 4.0000 g | Freq: Four times a day (QID) | TRANSDERMAL | 4 refills | Status: DC

## 2016-11-08 NOTE — Telephone Encounter (Signed)
Placed a call to Ms. Patry, reviewed X-ray results. DX: Degenerative Changes. RX: Voltaren Gel, she verbalizes understanding.

## 2016-11-30 ENCOUNTER — Other Ambulatory Visit (HOSPITAL_BASED_OUTPATIENT_CLINIC_OR_DEPARTMENT_OTHER): Payer: Medicare Other

## 2016-11-30 ENCOUNTER — Other Ambulatory Visit: Payer: Medicare Other

## 2016-11-30 ENCOUNTER — Ambulatory Visit (HOSPITAL_BASED_OUTPATIENT_CLINIC_OR_DEPARTMENT_OTHER): Payer: Medicare Other

## 2016-11-30 ENCOUNTER — Ambulatory Visit: Payer: Medicare Other

## 2016-11-30 VITALS — BP 125/89 | HR 93 | Resp 20

## 2016-11-30 DIAGNOSIS — C50911 Malignant neoplasm of unspecified site of right female breast: Secondary | ICD-10-CM

## 2016-11-30 DIAGNOSIS — C50811 Malignant neoplasm of overlapping sites of right female breast: Secondary | ICD-10-CM

## 2016-11-30 DIAGNOSIS — Z170 Estrogen receptor positive status [ER+]: Principal | ICD-10-CM

## 2016-11-30 DIAGNOSIS — Z5111 Encounter for antineoplastic chemotherapy: Secondary | ICD-10-CM | POA: Diagnosis not present

## 2016-11-30 LAB — COMPREHENSIVE METABOLIC PANEL
ALT: 13 U/L (ref 0–55)
AST: 13 U/L (ref 5–34)
Albumin: 3.8 g/dL (ref 3.5–5.0)
Alkaline Phosphatase: 112 U/L (ref 40–150)
Anion Gap: 11 mEq/L (ref 3–11)
BUN: 9.1 mg/dL (ref 7.0–26.0)
CHLORIDE: 108 meq/L (ref 98–109)
CO2: 25 mEq/L (ref 22–29)
Calcium: 9.7 mg/dL (ref 8.4–10.4)
Creatinine: 0.8 mg/dL (ref 0.6–1.1)
EGFR: >90 mL/min/{1.73_m2} (ref >90)
GLUCOSE: 82 mg/dL (ref 70–140)
Potassium: 3.4 mEq/L — ABNORMAL LOW (ref 3.5–5.1)
SODIUM: 145 meq/L (ref 136–145)
Total Bilirubin: 0.29 mg/dL (ref 0.20–1.20)
Total Protein: 8 g/dL (ref 6.4–8.3)

## 2016-11-30 LAB — CBC WITH DIFFERENTIAL/PLATELET
BASO%: 0.2 % (ref 0.0–2.0)
Basophils Absolute: 0 10*3/uL (ref 0.0–0.1)
EOS%: 7.7 % — AB (ref 0.0–7.0)
Eosinophils Absolute: 0.7 10*3/uL — ABNORMAL HIGH (ref 0.0–0.5)
HCT: 40.2 % (ref 34.8–46.6)
HGB: 13.2 g/dL (ref 11.6–15.9)
LYMPH%: 41 % (ref 14.0–49.7)
MCH: 28.2 pg (ref 25.1–34.0)
MCHC: 32.8 g/dL (ref 31.5–36.0)
MCV: 85.9 fL (ref 79.5–101.0)
MONO#: 0.5 10*3/uL (ref 0.1–0.9)
MONO%: 4.9 % (ref 0.0–14.0)
NEUT#: 4.5 10*3/uL (ref 1.5–6.5)
NEUT%: 46.2 % (ref 38.4–76.8)
Platelets: 321 10*3/uL (ref 145–400)
RBC: 4.68 10*6/uL (ref 3.70–5.45)
RDW: 14.6 % — ABNORMAL HIGH (ref 11.2–14.5)
WBC: 9.6 10*3/uL (ref 3.9–10.3)
lymph#: 4 10*3/uL — ABNORMAL HIGH (ref 0.9–3.3)
nRBC: 0 % (ref 0–0)

## 2016-11-30 MED ORDER — GOSERELIN ACETATE 3.6 MG ~~LOC~~ IMPL
3.6000 mg | DRUG_IMPLANT | Freq: Once | SUBCUTANEOUS | Status: AC
  Administered 2016-11-30: 3.6 mg via SUBCUTANEOUS
  Filled 2016-11-30: qty 3.6

## 2016-11-30 NOTE — Patient Instructions (Signed)
Goserelin injection What is this medicine? GOSERELIN (GOE se rel in) is similar to a hormone found in the body. It lowers the amount of sex hormones that the body makes. Men will have lower testosterone levels and women will have lower estrogen levels while taking this medicine. In men, this medicine is used to treat prostate cancer; the injection is either given once per month or once every 12 weeks. A once per month injection (only) is used to treat women with endometriosis, dysfunctional uterine bleeding, or advanced breast cancer. This medicine may be used for other purposes; ask your health care provider or pharmacist if you have questions. COMMON BRAND NAME(S): Zoladex What should I tell my health care provider before I take this medicine? They need to know if you have any of these conditions (some only apply to women): -diabetes -heart disease or previous heart attack -high blood pressure -high cholesterol -kidney disease -osteoporosis or low bone density -problems passing urine -spinal cord injury -stroke -tobacco smoker -an unusual or allergic reaction to goserelin, hormone therapy, other medicines, foods, dyes, or preservatives -pregnant or trying to get pregnant -breast-feeding How should I use this medicine? This medicine is for injection under the skin. It is given by a health care professional in a hospital or clinic setting. Men receive this injection once every 4 weeks or once every 12 weeks. Women will only receive the once every 4 weeks injection. Talk to your pediatrician regarding the use of this medicine in children. Special care may be needed. Overdosage: If you think you have taken too much of this medicine contact a poison control center or emergency room at once. NOTE: This medicine is only for you. Do not share this medicine with others. What if I miss a dose? It is important not to miss your dose. Call your doctor or health care professional if you are unable to  keep an appointment. What may interact with this medicine? -female hormones like estrogen -herbal or dietary supplements like black cohosh, chasteberry, or DHEA -female hormones like testosterone -prasterone This list may not describe all possible interactions. Give your health care provider a list of all the medicines, herbs, non-prescription drugs, or dietary supplements you use. Also tell them if you smoke, drink alcohol, or use illegal drugs. Some items may interact with your medicine. What should I watch for while using this medicine? Visit your doctor or health care professional for regular checks on your progress. Your symptoms may appear to get worse during the first weeks of this therapy. Tell your doctor or healthcare professional if your symptoms do not start to get better or if they get worse after this time. Your bones may get weaker if you take this medicine for a long time. If you smoke or frequently drink alcohol you may increase your risk of bone loss. A family history of osteoporosis, chronic use of drugs for seizures (convulsions), or corticosteroids can also increase your risk of bone loss. Talk to your doctor about how to keep your bones strong. This medicine should stop regular monthly menstration in women. Tell your doctor if you continue to menstrate. Women should not become pregnant while taking this medicine or for 12 weeks after stopping this medicine. Women should inform their doctor if they wish to become pregnant or think they might be pregnant. There is a potential for serious side effects to an unborn child. Talk to your health care professional or pharmacist for more information. Do not breast-feed an infant while taking   this medicine. Men should inform their doctors if they wish to father a child. This medicine may lower sperm counts. Talk to your health care professional or pharmacist for more information. What side effects may I notice from receiving this  medicine? Side effects that you should report to your doctor or health care professional as soon as possible: -allergic reactions like skin rash, itching or hives, swelling of the face, lips, or tongue -bone pain -breathing problems -changes in vision -chest pain -feeling faint or lightheaded, falls -fever, chills -pain, swelling, warmth in the leg -pain, tingling, numbness in the hands or feet -signs and symptoms of low blood pressure like dizziness; feeling faint or lightheaded, falls; unusually weak or tired -stomach pain -swelling of the ankles, feet, hands -trouble passing urine or change in the amount of urine -unusually high or low blood pressure -unusually weak or tired Side effects that usually do not require medical attention (report to your doctor or health care professional if they continue or are bothersome): -change in sex drive or performance -changes in breast size in both males and females -changes in emotions or moods -headache -hot flashes -irritation at site where injected -loss of appetite -skin problems like acne, dry skin -vaginal dryness This list may not describe all possible side effects. Call your doctor for medical advice about side effects. You may report side effects to FDA at 1-800-FDA-1088. Where should I keep my medicine? This drug is given in a hospital or clinic and will not be stored at home. NOTE: This sheet is a summary. It may not cover all possible information. If you have questions about this medicine, talk to your doctor, pharmacist, or health care provider.  2018 Elsevier/Gold Standard (2013-07-31 11:10:35)  

## 2016-12-01 ENCOUNTER — Encounter: Payer: Self-pay | Admitting: Registered Nurse

## 2016-12-01 ENCOUNTER — Encounter: Payer: Medicare Other | Attending: Registered Nurse | Admitting: Registered Nurse

## 2016-12-01 VITALS — BP 112/76 | HR 103

## 2016-12-01 DIAGNOSIS — M25562 Pain in left knee: Secondary | ICD-10-CM

## 2016-12-01 DIAGNOSIS — M545 Low back pain, unspecified: Secondary | ICD-10-CM

## 2016-12-01 DIAGNOSIS — M546 Pain in thoracic spine: Secondary | ICD-10-CM | POA: Insufficient documentation

## 2016-12-01 DIAGNOSIS — I890 Lymphedema, not elsewhere classified: Secondary | ICD-10-CM

## 2016-12-01 DIAGNOSIS — G243 Spasmodic torticollis: Secondary | ICD-10-CM | POA: Diagnosis not present

## 2016-12-01 DIAGNOSIS — R062 Wheezing: Secondary | ICD-10-CM | POA: Diagnosis not present

## 2016-12-01 DIAGNOSIS — M791 Myalgia: Secondary | ICD-10-CM

## 2016-12-01 DIAGNOSIS — G894 Chronic pain syndrome: Secondary | ICD-10-CM

## 2016-12-01 DIAGNOSIS — Z5181 Encounter for therapeutic drug level monitoring: Secondary | ICD-10-CM | POA: Diagnosis not present

## 2016-12-01 DIAGNOSIS — Z79899 Other long term (current) drug therapy: Secondary | ICD-10-CM | POA: Diagnosis not present

## 2016-12-01 DIAGNOSIS — G8929 Other chronic pain: Secondary | ICD-10-CM

## 2016-12-01 DIAGNOSIS — M25561 Pain in right knee: Secondary | ICD-10-CM

## 2016-12-01 DIAGNOSIS — M7918 Myalgia, other site: Secondary | ICD-10-CM

## 2016-12-01 MED ORDER — OXYCODONE HCL 5 MG PO TABS: 5.0000 mg | ORAL_TABLET | Freq: Four times a day (QID) | ORAL | 0 refills | Status: DC | PRN

## 2016-12-01 NOTE — Progress Notes (Signed)
Subjective:    Patient ID: Karen Hopkins, female    DOB: 05/31/1976, 41 y.o.   MRN: [id number]  HPI:  [name] is a 41year old female who returns for follow up appointmentfor chronic pain and medication refill. She states her pain is located in her neck radiating into her right shoulder, lower back, right hip and bilateral knee pain. She rates her pain 6.  Her current exercise regime is walking and performing stretching exercises.  Karen Hopkins last UDS was performed on 10/04/2016, it was consistent.   Pain Inventory Average Pain 7 Pain Right Now 6 My pain is sharp, burning, dull, stabbing, tingling and aching  In the last 24 hours, has pain interfered with the following? General activity 6 Relation with others 6 Enjoyment of life 6 What TIME of day is your pain at its worst? all Sleep (in general) Poor  Pain is worse with: walking, bending, sitting and standing Pain improves with: therapy/exercise, medication, TENS and injections Relief from Meds: 5  Mobility walk without assistance  Function disabled: date disabled .  Neuro/Psych trouble walking spasms depression anxiety  Prior Studies Any changes since last visit?  no  Physicians involved in your care Any changes since last visit?  no   Family History  Problem Relation Age of Onset  . Hypertension Mother   . Diabetes type II Father   . Prostate cancer Father   . Stroke Neg Hx    Social History   Social History  . Marital status: Single    Spouse name: N/A  . Number of children: 3  . Years of education: 84   Occupational History  . Disability     Social History Main Topics  . Smoking status: Former Smoker    Packs/day: 0.25    Years: 5.00    Types: Cigarettes    Start date: 06/07/2002    Quit date: 04/26/2010  . Smokeless tobacco: Never Used  . Alcohol use No  . Drug use: No  . Sexual activity: Not Currently    Birth control/ protection: Surgical   Other Topics Concern  . None     Social History Narrative   Lives with kids   Caffeine use: none    Past Surgical History:  Procedure Laterality Date  . BREAST BIOPSY Right 08/26/2009  . BREAST REDUCTION SURGERY Left 08/23/2013   Procedure: LEFT BREAST REDUCTION  ;  Surgeon: Theodoro Kos, DO;  Location: [place];  Service: Plastics;  Laterality: Left;  . CESAREAN SECTION  07/15/2001; 03/18/2004; 11/21/2008  . LATISSIMUS FLAP TO BREAST Right 03/12/2013   Procedure: RIGHT BREAST LATISSIMUS FLAP WITH EXPANDER PLACEMENT;  Surgeon: Theodoro Kos, DO;  Location: [facility];  Service: Plastics;  Laterality: Right;  . LIPOSUCTION Bilateral 08/23/2013   Procedure: LIPOSUCTION;  Surgeon: Theodoro Kos, DO;  Location: [facility];  Service: Plastics;  Laterality: Bilateral;  . MASTECTOMY Right 2011  . MODIFIED RADICAL MASTECTOMY Right 03/11/2010  . NASAL SEPTUM SURGERY    . PORT-A-CATH REMOVAL Left 12/01/2010  . PORTACATH PLACEMENT Left 09/12/2009  . REDUCTION MAMMAPLASTY Left 08/2013  . REMOVAL OF TISSUE EXPANDER AND PLACEMENT OF IMPLANT Right 08/23/2013   Procedure: REMOVAL RIGHT TISSUE EXPANDER AND PLACEMENT OF IMPLANT TO RIGHT BREAST ;  Surgeon: Theodoro Kos, DO;  Location: [facility];  Service: Plastics;  Laterality: Right;  . SUPRA-UMBILICAL HERNIA  [id number]  . TUBAL LIGATION  11/21/2008   Past Medical History:  Diagnosis Date  .  Anxiety   . Cancer ([place])   . Depression   . Essential hypertension 11/05/2015  . History of breast cancer 2011   right  . History of chemotherapy 2011  . History of radiation therapy 2011  . Hypertension    under control with med., has been on med. x 2 yr.  . Lymphedema of arm    right; no BP or puncture to right arm  . Personal history of chemotherapy 09/16/2009   rt breast  . Personal history of radiation therapy 05/2010   rt breast  . Pneumonia   . Seasonal allergies   . Sinus headache    There were no vitals taken for this visit.  Opioid Risk  Score:   Fall Risk Score:  `1  Depression screen PHQ 2/9  Depression screen [facility] 2/9 08/31/2016 07/22/2016 07/20/2016 07/08/2016 08/04/2015 06/25/2015 03/04/2015  Decreased Interest 0 0 0 0 0 0 0  Down, Depressed, Hopeless 0 0 0 0 0 0 0  PHQ - 2 Score 0 0 0 0 0 0 0  Some recent data might be hidden     Review of Systems  Constitutional: Negative.   HENT: Negative.   Eyes: Negative.   Respiratory: Negative.   Cardiovascular: Negative.   Gastrointestinal: Positive for constipation.  Endocrine: Negative.   Genitourinary: Negative.   Musculoskeletal: Negative.   Skin: Negative.   Allergic/Immunologic: Negative.   Neurological: Negative.   Hematological: Negative.   Psychiatric/Behavioral: Negative.   All other systems reviewed and are negative.      Objective:   Physical Exam  Constitutional: She is oriented to Karen Hopkins, place, and time. She appears well-developed and well-nourished.  HENT:  Head: Normocephalic and atraumatic.  Neck: Normal range of motion. Neck supple.  Cardiovascular: Normal rate and regular rhythm.   Pulmonary/Chest: Effort normal and breath sounds normal.  Musculoskeletal:  Normal Muscle Bulk and Muscle Testing Reveals: Upper Extremities: Left: Full ROM and Muscle Strength 5/5 Right: Decreased ROM 90 Degrees and Muscle Strength 5/5 Lumbar Paraspinal Tenderness: L-3-L-5 Lower Extremities: Full ROM and Muscle Strength 5/5 Arises from Table with ease Narrow Based Gait  Neurological: She is alert and oriented to Karen Hopkins, place, and time.  Skin: Skin is warm and dry.  Psychiatric: She has a normal mood and affect.  Nursing note and vitals reviewed.         Assessment & Plan:  1.Myofascial pain syndrome chronic postoperative, as well as post radiation: S/P Breast Reconstruction Surgery x 2. 12/01/2016 Refilled: Oxycodone 5 mg one tablet every 6 hours as needed for severe pain.# 120.Continue HEP. We will continue the opioid monitoring program, this consists  of regular clinic visits, examinations, urine drug screen, pill counts as well as use of [place] Controlled Substance Reporting System. 2. Neoplasm Related Pain: Continue Current Medication Regime. 12/01/2016 3. Lymphedema of Right Upper Extremity: Oncology Following. 12/01/2016 4. Cervical Dystonia: Scheduled for Botox Injection with Dr. Letta Pate 5. Obesity: Continue with Healthy Diet and Exercise Regime. 12/01/2016 6. Muscle Spasms: Continue Tizanidine. 12/01/2016 7. Depression: Continue Cymbalta and Counseling at [place]. 12/01/2016 8/ Chronic Bilateral Knee Pai/ OA: Continue Voltaren Jel. 12/01/2016  20 minutes of face to face patient care time was spent during this visit. All questions were encouraged and answered.   F/U in 1 month

## 2016-12-27 ENCOUNTER — Telehealth: Payer: Self-pay | Admitting: *Deleted

## 2016-12-27 DIAGNOSIS — E876 Hypokalemia: Secondary | ICD-10-CM

## 2016-12-27 DIAGNOSIS — C50811 Malignant neoplasm of overlapping sites of right female breast: Secondary | ICD-10-CM

## 2016-12-27 MED ORDER — ALPRAZOLAM ER 1 MG PO TB24: 1.0000 mg | ORAL_TABLET | Freq: Every day | ORAL | 2 refills | Status: DC

## 2016-12-27 NOTE — Telephone Encounter (Signed)
"  I have been requesting alprazolam refill since last week from [facility].  They've not received a response yet and I ran out yesterday.  Could you call an order in today?"  Order called to [facility] at [employer] [facility] [employer].  Patient notified refill called to [facility].

## 2016-12-28 ENCOUNTER — Ambulatory Visit (HOSPITAL_BASED_OUTPATIENT_CLINIC_OR_DEPARTMENT_OTHER): Payer: Medicare Other

## 2016-12-28 ENCOUNTER — Telehealth: Payer: Self-pay | Admitting: *Deleted

## 2016-12-28 ENCOUNTER — Other Ambulatory Visit (HOSPITAL_BASED_OUTPATIENT_CLINIC_OR_DEPARTMENT_OTHER): Payer: Medicare Other

## 2016-12-28 VITALS — HR 95 | Temp 97.0°F | Resp 20

## 2016-12-28 DIAGNOSIS — C50911 Malignant neoplasm of unspecified site of right female breast: Secondary | ICD-10-CM

## 2016-12-28 DIAGNOSIS — Z170 Estrogen receptor positive status [ER+]: Principal | ICD-10-CM

## 2016-12-28 DIAGNOSIS — C50811 Malignant neoplasm of overlapping sites of right female breast: Secondary | ICD-10-CM

## 2016-12-28 DIAGNOSIS — Z5111 Encounter for antineoplastic chemotherapy: Secondary | ICD-10-CM

## 2016-12-28 LAB — CBC WITH DIFFERENTIAL/PLATELET
BASO%: 0.6 % (ref 0.0–2.0)
Basophils Absolute: 0.1 10*3/uL (ref 0.0–0.1)
EOS%: 6.9 % (ref 0.0–7.0)
Eosinophils Absolute: 0.6 10*3/uL — ABNORMAL HIGH (ref 0.0–0.5)
HCT: 42.2 % (ref 34.8–46.6)
HGB: 13.9 g/dL (ref 11.6–15.9)
LYMPH%: 33.2 % (ref 14.0–49.7)
MCH: 27.9 pg (ref 25.1–34.0)
MCHC: 32.9 g/dL (ref 31.5–36.0)
MCV: 84.9 fL (ref 79.5–101.0)
MONO#: 0.5 10*3/uL (ref 0.1–0.9)
MONO%: 6.1 % (ref 0.0–14.0)
NEUT#: 4.5 10*3/uL (ref 1.5–6.5)
NEUT%: 53.2 % (ref 38.4–76.8)
PLATELETS: 289 10*3/uL (ref 145–400)
RBC: 4.98 10*6/uL (ref 3.70–5.45)
RDW: 14.7 % — ABNORMAL HIGH (ref 11.2–14.5)
WBC: 8.5 10*3/uL (ref 3.9–10.3)
lymph#: 2.8 10*3/uL (ref 0.9–3.3)

## 2016-12-28 LAB — COMPREHENSIVE METABOLIC PANEL
ALT: 14 U/L (ref 0–55)
ANION GAP: 11 meq/L (ref 3–11)
AST: 14 U/L (ref 5–34)
Albumin: 3.9 g/dL (ref 3.5–5.0)
Alkaline Phosphatase: 105 U/L (ref 40–150)
BUN: 10.4 mg/dL (ref 7.0–26.0)
CHLORIDE: 107 meq/L (ref 98–109)
CO2: 25 meq/L (ref 22–29)
CREATININE: 0.9 mg/dL (ref 0.6–1.1)
Calcium: 9.6 mg/dL (ref 8.4–10.4)
Glucose: 96 mg/dl (ref 70–140)
POTASSIUM: 2.9 meq/L — AB (ref 3.5–5.1)
Sodium: 143 mEq/L (ref 136–145)
Total Bilirubin: 0.36 mg/dL (ref 0.20–1.20)
Total Protein: 7.8 g/dL (ref 6.4–8.3)

## 2016-12-28 MED ORDER — GOSERELIN ACETATE 3.6 MG ~~LOC~~ IMPL
3.6000 mg | DRUG_IMPLANT | Freq: Once | SUBCUTANEOUS | Status: AC
  Administered 2016-12-28: 3.6 mg via SUBCUTANEOUS
  Filled 2016-12-28: qty 3.6

## 2016-12-28 NOTE — Telephone Encounter (Signed)
This RN called pt per lab result with potassium level of 2.9.  Per discussion pt states she has not been taking her replacement potassium as ordered.  Pt understands need to maintain k-dur at 20 mEq three times a day.  " I will try to do better ".  Lab will be rechecked in 1 month with next injection.

## 2016-12-28 NOTE — Patient Instructions (Signed)
Goserelin injection What is this medicine? GOSERELIN (GOE se rel in) is similar to a hormone found in the body. It lowers the amount of sex hormones that the body makes. Men will have lower testosterone levels and women will have lower estrogen levels while taking this medicine. In men, this medicine is used to treat prostate cancer; the injection is either given once per month or once every 12 weeks. A once per month injection (only) is used to treat women with endometriosis, dysfunctional uterine bleeding, or advanced breast cancer. This medicine may be used for other purposes; ask your health care provider or pharmacist if you have questions. What should I tell my health care provider before I take this medicine? They need to know if you have any of these conditions (some only apply to women): -diabetes -heart disease or previous heart attack -high blood pressure -high cholesterol -kidney disease -osteoporosis or low bone density -problems passing urine -spinal cord injury -stroke -tobacco smoker -an unusual or allergic reaction to goserelin, hormone therapy, other medicines, foods, dyes, or preservatives -pregnant or trying to get pregnant -breast-feeding How should I use this medicine? This medicine is for injection under the skin. It is given by a health care professional in a hospital or clinic setting. Men receive this injection once every 4 weeks or once every 12 weeks. Women will only receive the once every 4 weeks injection. Talk to your pediatrician regarding the use of this medicine in children. Special care may be needed. Overdosage: If you think you have taken too much of this medicine contact a poison control center or emergency room at once. NOTE: This medicine is only for you. Do not share this medicine with others. What if I miss a dose? It is important not to miss your dose. Call your doctor or health care professional if you are unable to keep an appointment. What may  interact with this medicine? -female hormones like estrogen -herbal or dietary supplements like black cohosh, chasteberry, or DHEA -female hormones like testosterone -prasterone This list may not describe all possible interactions. Give your health care provider a list of all the medicines, herbs, non-prescription drugs, or dietary supplements you use. Also tell them if you smoke, drink alcohol, or use illegal drugs. Some items may interact with your medicine. What should I watch for while using this medicine? Visit your doctor or health care professional for regular checks on your progress. Your symptoms may appear to get worse during the first weeks of this therapy. Tell your doctor or healthcare professional if your symptoms do not start to get better or if they get worse after this time. Your bones may get weaker if you take this medicine for a long time. If you smoke or frequently drink alcohol you may increase your risk of bone loss. A family history of osteoporosis, chronic use of drugs for seizures (convulsions), or corticosteroids can also increase your risk of bone loss. Talk to your doctor about how to keep your bones strong. This medicine should stop regular monthly menstration in women. Tell your doctor if you continue to menstrate. Women should not become pregnant while taking this medicine or for 12 weeks after stopping this medicine. Women should inform their doctor if they wish to become pregnant or think they might be pregnant. There is a potential for serious side effects to an unborn child. Talk to your health care professional or pharmacist for more information. Do not breast-feed an infant while taking this medicine. Men should   inform their doctors if they wish to father a child. This medicine may lower sperm counts. Talk to your health care professional or pharmacist for more information. What side effects may I notice from receiving this medicine? Side effects that you should  report to your doctor or health care professional as soon as possible: -allergic reactions like skin rash, itching or hives, swelling of the face, lips, or tongue -bone pain -breathing problems -changes in vision -chest pain -feeling faint or lightheaded, falls -fever, chills -pain, swelling, warmth in the leg -pain, tingling, numbness in the hands or feet -signs and symptoms of low blood pressure like dizziness; feeling faint or lightheaded, falls; unusually weak or tired -stomach pain -swelling of the ankles, feet, hands -trouble passing urine or change in the amount of urine -unusually high or low blood pressure -unusually weak or tired Side effects that usually do not require medical attention (report to your doctor or health care professional if they continue or are bothersome): -change in sex drive or performance -changes in breast size in both males and females -changes in emotions or moods -headache -hot flashes -irritation at site where injected -loss of appetite -skin problems like acne, dry skin -vaginal dryness This list may not describe all possible side effects. Call your doctor for medical advice about side effects. You may report side effects to FDA at 1-800-FDA-1088. Where should I keep my medicine? This drug is given in a hospital or clinic and will not be stored at home. NOTE: This sheet is a summary. It may not cover all possible information. If you have questions about this medicine, talk to your doctor, pharmacist, or health care provider.    2016, Elsevier/Gold Standard. (2013-07-31 11:10:35)  

## 2017-01-04 ENCOUNTER — Encounter: Payer: Medicare Other | Attending: Registered Nurse

## 2017-01-04 ENCOUNTER — Ambulatory Visit (HOSPITAL_BASED_OUTPATIENT_CLINIC_OR_DEPARTMENT_OTHER): Payer: Medicare Other | Admitting: Physical Medicine & Rehabilitation

## 2017-01-04 ENCOUNTER — Encounter: Payer: Self-pay | Admitting: Physical Medicine & Rehabilitation

## 2017-01-04 VITALS — BP 102/70 | HR 94 | Resp 14

## 2017-01-04 DIAGNOSIS — M546 Pain in thoracic spine: Secondary | ICD-10-CM | POA: Diagnosis not present

## 2017-01-04 DIAGNOSIS — G243 Spasmodic torticollis: Secondary | ICD-10-CM | POA: Diagnosis not present

## 2017-01-04 DIAGNOSIS — I890 Lymphedema, not elsewhere classified: Secondary | ICD-10-CM | POA: Insufficient documentation

## 2017-01-04 MED ORDER — OXYCODONE HCL 5 MG PO TABS: 5.0000 mg | ORAL_TABLET | Freq: Four times a day (QID) | ORAL | 0 refills | Status: DC | PRN

## 2017-01-04 NOTE — Progress Notes (Signed)
  Botulinum toxin injection for cervical dystonia CPT code [phone] Diagnosis code G 24.3 Indication is cervical dystonia that has not responded to conservative care and interferes with activities of daily living as well as cervical range of motion. Chronic cervical pain not relieved by other treatments.  Informed consent was obtained after describing risks and benefits of the procedure with the patient this included bleeding bruising and infection The patient elects to proceed and has given Written consent.  REMS form completed Right Trapezius, 50 Right levator, 75 units Right sternocleidomastoid, 25units Right platysma 25 units Right scalene 25 units Patient placed in a seated position A 27-gauge 1 inch needle electrode was used to guide the injection under EMG guidance.   All injections done after negative drawback for blood. Patient tolerated procedure well. Post procedure instructions given. Follow up appointment made

## 2017-01-07 ENCOUNTER — Other Ambulatory Visit: Payer: Self-pay | Admitting: Emergency Medicine

## 2017-01-07 MED ORDER — POTASSIUM CHLORIDE ER 10 MEQ PO TBCR: 20.0000 meq | EXTENDED_RELEASE_TABLET | Freq: Three times a day (TID) | ORAL | 1 refills | Status: DC

## 2017-01-25 ENCOUNTER — Other Ambulatory Visit (HOSPITAL_BASED_OUTPATIENT_CLINIC_OR_DEPARTMENT_OTHER): Payer: Medicare Other

## 2017-01-25 ENCOUNTER — Ambulatory Visit (HOSPITAL_BASED_OUTPATIENT_CLINIC_OR_DEPARTMENT_OTHER): Payer: Medicare Other

## 2017-01-25 VITALS — BP 123/74 | HR 96 | Temp 98.0°F | Resp 16

## 2017-01-25 DIAGNOSIS — Z170 Estrogen receptor positive status [ER+]: Principal | ICD-10-CM

## 2017-01-25 DIAGNOSIS — Z5111 Encounter for antineoplastic chemotherapy: Secondary | ICD-10-CM

## 2017-01-25 DIAGNOSIS — C50811 Malignant neoplasm of overlapping sites of right female breast: Secondary | ICD-10-CM

## 2017-01-25 DIAGNOSIS — C50911 Malignant neoplasm of unspecified site of right female breast: Secondary | ICD-10-CM

## 2017-01-25 LAB — COMPREHENSIVE METABOLIC PANEL
ALT: 11 U/L (ref 0–55)
AST: 11 U/L (ref 5–34)
Albumin: 3.5 g/dL (ref 3.5–5.0)
Alkaline Phosphatase: 111 U/L (ref 40–150)
Anion Gap: 7 mEq/L (ref 3–11)
BUN: 8.7 mg/dL (ref 7.0–26.0)
CALCIUM: 9.5 mg/dL (ref 8.4–10.4)
CHLORIDE: 109 meq/L (ref 98–109)
CO2: 25 meq/L (ref 22–29)
CREATININE: 0.9 mg/dL (ref 0.6–1.1)
EGFR: >90 mL/min/{1.73_m2} (ref >90)
Glucose: 100 mg/dl (ref 70–140)
POTASSIUM: 3.4 meq/L — AB (ref 3.5–5.1)
Sodium: 140 mEq/L (ref 136–145)
Total Bilirubin: 0.41 mg/dL (ref 0.20–1.20)
Total Protein: 7.2 g/dL (ref 6.4–8.3)

## 2017-01-25 LAB — CBC WITH DIFFERENTIAL/PLATELET
BASO%: 0.1 % (ref 0.0–2.0)
BASOS ABS: 0 10*3/uL (ref 0.0–0.1)
EOS%: 7 % (ref 0.0–7.0)
Eosinophils Absolute: 0.6 10*3/uL — ABNORMAL HIGH (ref 0.0–0.5)
HCT: 38.3 % (ref 34.8–46.6)
HGB: 12.5 g/dL (ref 11.6–15.9)
LYMPH%: 35.9 % (ref 14.0–49.7)
MCH: 28.2 pg (ref 25.1–34.0)
MCHC: 32.6 g/dL (ref 31.5–36.0)
MCV: 86.5 fL (ref 79.5–101.0)
MONO#: 0.4 10*3/uL (ref 0.1–0.9)
MONO%: 4.3 % (ref 0.0–14.0)
NEUT#: 4.7 10*3/uL (ref 1.5–6.5)
NEUT%: 52.7 % (ref 38.4–76.8)
Platelets: 290 10*3/uL (ref 145–400)
RBC: 4.43 10*6/uL (ref 3.70–5.45)
RDW: 14.3 % (ref 11.2–14.5)
WBC: 8.8 10*3/uL (ref 3.9–10.3)
lymph#: 3.2 10*3/uL (ref 0.9–3.3)

## 2017-01-25 MED ORDER — GOSERELIN ACETATE 3.6 MG ~~LOC~~ IMPL
3.6000 mg | DRUG_IMPLANT | Freq: Once | SUBCUTANEOUS | Status: AC
  Administered 2017-01-25: 3.6 mg via SUBCUTANEOUS
  Filled 2017-01-25: qty 3.6

## 2017-01-25 NOTE — Patient Instructions (Signed)
Goserelin injection What is this medicine? GOSERELIN (GOE se rel in) is similar to a hormone found in the body. It lowers the amount of sex hormones that the body makes. Men will have lower testosterone levels and women will have lower estrogen levels while taking this medicine. In men, this medicine is used to treat prostate cancer; the injection is either given once per month or once every 12 weeks. A once per month injection (only) is used to treat women with endometriosis, dysfunctional uterine bleeding, or advanced breast cancer. This medicine may be used for other purposes; ask your health care provider or pharmacist if you have questions. What should I tell my health care provider before I take this medicine? They need to know if you have any of these conditions (some only apply to women): -diabetes -heart disease or previous heart attack -high blood pressure -high cholesterol -kidney disease -osteoporosis or low bone density -problems passing urine -spinal cord injury -stroke -tobacco smoker -an unusual or allergic reaction to goserelin, hormone therapy, other medicines, foods, dyes, or preservatives -pregnant or trying to get pregnant -breast-feeding How should I use this medicine? This medicine is for injection under the skin. It is given by a health care professional in a hospital or clinic setting. Men receive this injection once every 4 weeks or once every 12 weeks. Women will only receive the once every 4 weeks injection. Talk to your pediatrician regarding the use of this medicine in children. Special care may be needed. Overdosage: If you think you have taken too much of this medicine contact a poison control center or emergency room at once. NOTE: This medicine is only for you. Do not share this medicine with others. What if I miss a dose? It is important not to miss your dose. Call your doctor or health care professional if you are unable to keep an appointment. What may  interact with this medicine? -female hormones like estrogen -herbal or dietary supplements like black cohosh, chasteberry, or DHEA -female hormones like testosterone -prasterone This list may not describe all possible interactions. Give your health care provider a list of all the medicines, herbs, non-prescription drugs, or dietary supplements you use. Also tell them if you smoke, drink alcohol, or use illegal drugs. Some items may interact with your medicine. What should I watch for while using this medicine? Visit your doctor or health care professional for regular checks on your progress. Your symptoms may appear to get worse during the first weeks of this therapy. Tell your doctor or healthcare professional if your symptoms do not start to get better or if they get worse after this time. Your bones may get weaker if you take this medicine for a long time. If you smoke or frequently drink alcohol you may increase your risk of bone loss. A family history of osteoporosis, chronic use of drugs for seizures (convulsions), or corticosteroids can also increase your risk of bone loss. Talk to your doctor about how to keep your bones strong. This medicine should stop regular monthly menstration in women. Tell your doctor if you continue to menstrate. Women should not become pregnant while taking this medicine or for 12 weeks after stopping this medicine. Women should inform their doctor if they wish to become pregnant or think they might be pregnant. There is a potential for serious side effects to an unborn child. Talk to your health care professional or pharmacist for more information. Do not breast-feed an infant while taking this medicine. Men should   inform their doctors if they wish to father a child. This medicine may lower sperm counts. Talk to your health care professional or pharmacist for more information. What side effects may I notice from receiving this medicine? Side effects that you should  report to your doctor or health care professional as soon as possible: -allergic reactions like skin rash, itching or hives, swelling of the face, lips, or tongue -bone pain -breathing problems -changes in vision -chest pain -feeling faint or lightheaded, falls -fever, chills -pain, swelling, warmth in the leg -pain, tingling, numbness in the hands or feet -signs and symptoms of low blood pressure like dizziness; feeling faint or lightheaded, falls; unusually weak or tired -stomach pain -swelling of the ankles, feet, hands -trouble passing urine or change in the amount of urine -unusually high or low blood pressure -unusually weak or tired Side effects that usually do not require medical attention (report to your doctor or health care professional if they continue or are bothersome): -change in sex drive or performance -changes in breast size in both males and females -changes in emotions or moods -headache -hot flashes -irritation at site where injected -loss of appetite -skin problems like acne, dry skin -vaginal dryness This list may not describe all possible side effects. Call your doctor for medical advice about side effects. You may report side effects to FDA at 1-800-FDA-1088. Where should I keep my medicine? This drug is given in a hospital or clinic and will not be stored at home. NOTE: This sheet is a summary. It may not cover all possible information. If you have questions about this medicine, talk to your doctor, pharmacist, or health care provider.    2016, Elsevier/Gold Standard. (2013-07-31 11:10:35)  

## 2017-02-15 ENCOUNTER — Encounter: Payer: Medicare Other | Attending: Registered Nurse

## 2017-02-15 ENCOUNTER — Encounter: Payer: Self-pay | Admitting: Physical Medicine & Rehabilitation

## 2017-02-15 ENCOUNTER — Ambulatory Visit (HOSPITAL_BASED_OUTPATIENT_CLINIC_OR_DEPARTMENT_OTHER): Payer: Medicare Other | Admitting: Physical Medicine & Rehabilitation

## 2017-02-15 VITALS — BP 121/82 | HR 85 | Resp 14

## 2017-02-15 DIAGNOSIS — M546 Pain in thoracic spine: Secondary | ICD-10-CM | POA: Diagnosis not present

## 2017-02-15 DIAGNOSIS — I890 Lymphedema, not elsewhere classified: Secondary | ICD-10-CM

## 2017-02-15 DIAGNOSIS — G243 Spasmodic torticollis: Secondary | ICD-10-CM

## 2017-02-15 DIAGNOSIS — R0789 Other chest pain: Secondary | ICD-10-CM | POA: Diagnosis not present

## 2017-02-15 MED ORDER — OXYCODONE HCL 5 MG PO TABS: 5.0000 mg | ORAL_TABLET | Freq: Four times a day (QID) | ORAL | 0 refills | Status: DC | PRN

## 2017-02-15 NOTE — Patient Instructions (Addendum)
May use voltaren gel to neck as well We are checking x-rays of the collarbone Stress can also affect your chronic pain When you stretch do so gently don't push it too hard

## 2017-02-15 NOTE — Progress Notes (Signed)
Subjective:    Patient ID: Karen Hopkins, female    DOB: 1976-01-06, 41 y.o.   MRN: [id number]  HPI 7/31-   Right Trapezius, 50 Right levator, 75 units Right sternocleidomastoid, 25units Right platysma 25 units Right scalene 25 units  botox didn't seem to help as much this last time  Increased pain R ant base of neck  Pain Inventory Average Pain 8 Pain Right Now 8 My pain is constant, sharp, dull and aching  In the last 24 hours, has pain interfered with the following? General activity 7 Relation with others 6 Enjoyment of life 6 What TIME of day is your pain at its worst? all Sleep (in general) Poor  Pain is worse with: no selection Pain improves with: medication Relief from Meds: 7  Mobility walk without assistance how many minutes can you walk? 10 ability to climb steps?  yes do you drive?  yes  Function disabled: date disabled . I need assistance with the following:  household duties  Neuro/Psych spasms confusion depression anxiety  Prior Studies Any changes since last visit?  no  Physicians involved in your care Any changes since last visit?  no   Family History  Problem Relation Age of Onset  . Hypertension Mother   . Diabetes type II Father   . Prostate cancer Father   . Stroke Neg Hx    Social History   Social History  . Marital status: Single    Spouse name: N/A  . Number of children: 3  . Years of education: 24   Occupational History  . Disability     Social History Main Topics  . Smoking status: Former Smoker    Packs/day: 0.25    Years: 5.00    Types: Cigarettes    Start date: 06/07/2002    Quit date: 04/26/2010  . Smokeless tobacco: Never Used  . Alcohol use No  . Drug use: No  . Sexual activity: Not Currently    Birth control/ protection: Surgical   Other Topics Concern  . None   Social History Narrative   Lives with kids   Caffeine use: none    Past Surgical History:  Procedure Laterality Date  . BREAST  BIOPSY Right 08/26/2009  . BREAST REDUCTION SURGERY Left 08/23/2013   Procedure: LEFT BREAST REDUCTION  ;  Surgeon: Theodoro Kos, DO;  Location: [place];  Service: Plastics;  Laterality: Left;  . CESAREAN SECTION  07/15/2001; 03/18/2004; 11/21/2008  . LATISSIMUS FLAP TO BREAST Right 03/12/2013   Procedure: RIGHT BREAST LATISSIMUS FLAP WITH EXPANDER PLACEMENT;  Surgeon: Theodoro Kos, DO;  Location: [place];  Service: Plastics;  Laterality: Right;  . LIPOSUCTION Bilateral 08/23/2013   Procedure: LIPOSUCTION;  Surgeon: Theodoro Kos, DO;  Location: [place];  Service: Plastics;  Laterality: Bilateral;  . MASTECTOMY Right 2011  . MODIFIED RADICAL MASTECTOMY Right 03/11/2010  . NASAL SEPTUM SURGERY    . PORT-A-CATH REMOVAL Left 12/01/2010  . PORTACATH PLACEMENT Left 09/12/2009  . REDUCTION MAMMAPLASTY Left 08/2013  . REMOVAL OF TISSUE EXPANDER AND PLACEMENT OF IMPLANT Right 08/23/2013   Procedure: REMOVAL RIGHT TISSUE EXPANDER AND PLACEMENT OF IMPLANT TO RIGHT BREAST ;  Surgeon: Theodoro Kos, DO;  Location: [place];  Service: Plastics;  Laterality: Right;  . SUPRA-UMBILICAL HERNIA  [id number]  . TUBAL LIGATION  11/21/2008   Past Medical History:  Diagnosis Date  . Anxiety   . Cancer ([place])   . Depression   . Essential  hypertension 11/05/2015  . History of breast cancer 2011   right  . History of chemotherapy 2011  . History of radiation therapy 2011  . Hypertension    under control with med., has been on med. x 2 yr.  . Lymphedema of arm    right; no BP or puncture to right arm  . Personal history of chemotherapy 09/16/2009   rt breast  . Personal history of radiation therapy 05/2010   rt breast  . Pneumonia   . Seasonal allergies   . Sinus headache    BP 121/82 (BP Location: Right Arm, Patient Position: Sitting, Cuff Size: Large)   Pulse 85   Resp 14   SpO2 98%   Opioid Risk Score:   Fall Risk Score:  `1  Depression screen PHQ  2/9  Depression screen [facility] 2/9 08/31/2016 07/22/2016 07/20/2016 07/08/2016 08/04/2015 06/25/2015 03/04/2015  Decreased Interest 0 0 0 0 0 0 0  Down, Depressed, Hopeless 0 0 0 0 0 0 0  PHQ - 2 Score 0 0 0 0 0 0 0  Some recent data might be hidden    Review of Systems  Constitutional: Negative.   HENT: Negative.   Eyes: Negative.   Respiratory: Negative.   Cardiovascular: Negative.   Gastrointestinal: Positive for constipation.  Endocrine: Negative.   Genitourinary: Negative.   Musculoskeletal: Positive for arthralgias and back pain.       Spasms   Skin: Negative.   Allergic/Immunologic: Negative.   Neurological: Negative.   Hematological: Negative.   Psychiatric/Behavioral: Positive for confusion and dysphoric mood. The patient is nervous/anxious.        Objective:   Physical Exam  Constitutional: She is oriented to [place], place, and time. She appears well-developed and well-nourished.  HENT:  Head: Normocephalic and atraumatic.  Eyes: Pupils are equal, round, and reactive to light. Conjunctivae and EOM are normal.  Musculoskeletal:  Pain over the right sternal costal joint. No pain over the clavicle. There is tenderness over the trapezius, levator scapula, scalenes platysma on the right. There is some prominence of the levator scapula and platysma and scalenes on the right. Right trapezius radiates pain toward the temporal area on the right  Lymphedema noted right upper arm  Pain with shoulder abduction on the right side, this pain is mainly in the sternal costal area  Neurological: She is alert and oriented to [place], place, and time. No sensory deficit. Gait normal.  Psychiatric: Her speech is normal and behavior is normal. Thought content normal. Her mood appears anxious.  Nursing note and vitals reviewed.         Assessment & Plan:  1. Right sternocostal pain, she has been stretching against the door. She notes no trauma to that area. Prior history of breast  carcinoma, we'll check x-rays  2. Cervical dystonia some improvement with the Botox, but not as much as usual, may need to adjust dosing upward, particularly in the levator scapula and trapezius muscle groups.  3. Chronic neoplasm related pain, right upper extremity, continue oxycodone 5 mg 4 times a day Continue opioid monitoring program. This consists of regular clinic visits, examinations, urine drug screen, pill counts as well as use of [place] controlled substance reporting System. Last urine toxicology 4/ 30 2018 was consistent for her opioid medications 4. Myofascial pain syndrome. Continue Zanaflex 4 mg 3 times a day  Physical medicine rehabilitation follow-up 1 month  We discussed her multiple pain issues today and which pain is related to which  condition, we talked about how stress can increase chronic pain and need for continued psychologic counseling. [facility] Over half of the 25 min visit was spent counseling and coordinating care.

## 2017-02-16 ENCOUNTER — Other Ambulatory Visit: Payer: Self-pay | Admitting: Family Medicine

## 2017-02-22 ENCOUNTER — Other Ambulatory Visit (HOSPITAL_BASED_OUTPATIENT_CLINIC_OR_DEPARTMENT_OTHER): Payer: Medicare Other

## 2017-02-22 ENCOUNTER — Ambulatory Visit (HOSPITAL_BASED_OUTPATIENT_CLINIC_OR_DEPARTMENT_OTHER): Payer: Medicare Other

## 2017-02-22 VITALS — BP 118/78 | HR 101 | Temp 98.7°F | Resp 17

## 2017-02-22 DIAGNOSIS — Z5111 Encounter for antineoplastic chemotherapy: Secondary | ICD-10-CM

## 2017-02-22 DIAGNOSIS — C50811 Malignant neoplasm of overlapping sites of right female breast: Secondary | ICD-10-CM

## 2017-02-22 DIAGNOSIS — Z170 Estrogen receptor positive status [ER+]: Secondary | ICD-10-CM

## 2017-02-22 DIAGNOSIS — C50911 Malignant neoplasm of unspecified site of right female breast: Secondary | ICD-10-CM

## 2017-02-22 LAB — CBC WITH DIFFERENTIAL/PLATELET
BASO%: 0.1 % (ref 0.0–2.0)
Basophils Absolute: 0 10*3/uL (ref 0.0–0.1)
EOS%: 7.3 % — ABNORMAL HIGH (ref 0.0–7.0)
Eosinophils Absolute: 0.7 10*3/uL — ABNORMAL HIGH (ref 0.0–0.5)
HCT: 40.7 % (ref 34.8–46.6)
HGB: 13.3 g/dL (ref 11.6–15.9)
LYMPH%: 37.3 % (ref 14.0–49.7)
MCH: 28.1 pg (ref 25.1–34.0)
MCHC: 32.7 g/dL (ref 31.5–36.0)
MCV: 85.9 fL (ref 79.5–101.0)
MONO#: 0.4 10*3/uL (ref 0.1–0.9)
MONO%: 4.1 % (ref 0.0–14.0)
NEUT#: 5.1 10*3/uL (ref 1.5–6.5)
NEUT%: 51.2 % (ref 38.4–76.8)
Platelets: 311 10*3/uL (ref 145–400)
RBC: 4.74 10*6/uL (ref 3.70–5.45)
RDW: 14.4 % (ref 11.2–14.5)
WBC: 9.9 10*3/uL (ref 3.9–10.3)
lymph#: 3.7 10*3/uL — ABNORMAL HIGH (ref 0.9–3.3)

## 2017-02-22 LAB — COMPREHENSIVE METABOLIC PANEL
ALT: 12 U/L (ref 0–55)
AST: 12 U/L (ref 5–34)
Albumin: 3.8 g/dL (ref 3.5–5.0)
Alkaline Phosphatase: 117 U/L (ref 40–150)
Anion Gap: 10 mEq/L (ref 3–11)
BUN: 7.6 mg/dL (ref 7.0–26.0)
CO2: 22 mEq/L (ref 22–29)
Calcium: 9.7 mg/dL (ref 8.4–10.4)
Chloride: 108 mEq/L (ref 98–109)
Creatinine: 0.9 mg/dL (ref 0.6–1.1)
EGFR: >90 mL/min/{1.73_m2} (ref >90)
Glucose: 105 mg/dl (ref 70–140)
Potassium: 3.5 mEq/L (ref 3.5–5.1)
Sodium: 140 mEq/L (ref 136–145)
Total Bilirubin: 0.29 mg/dL (ref 0.20–1.20)
Total Protein: 8.3 g/dL (ref 6.4–8.3)

## 2017-02-22 MED ORDER — GOSERELIN ACETATE 3.6 MG ~~LOC~~ IMPL
3.6000 mg | DRUG_IMPLANT | Freq: Once | SUBCUTANEOUS | Status: AC
  Administered 2017-02-22: 3.6 mg via SUBCUTANEOUS
  Filled 2017-02-22: qty 3.6

## 2017-02-24 ENCOUNTER — Ambulatory Visit
Admission: RE | Admit: 2017-02-24 | Discharge: 2017-02-24 | Disposition: A | Discharge status: Home / Self Care | Payer: Medicare Other | Source: Ambulatory Visit | Attending: Physical Medicine & Rehabilitation | Admitting: Physical Medicine & Rehabilitation

## 2017-02-24 DIAGNOSIS — R0789 Other chest pain: Secondary | ICD-10-CM

## 2017-02-24 DIAGNOSIS — M25511 Pain in right shoulder: Secondary | ICD-10-CM | POA: Diagnosis not present

## 2017-03-02 DIAGNOSIS — J449 Chronic obstructive pulmonary disease, unspecified: Secondary | ICD-10-CM | POA: Diagnosis not present

## 2017-03-02 DIAGNOSIS — R062 Wheezing: Secondary | ICD-10-CM | POA: Diagnosis not present

## 2017-03-10 ENCOUNTER — Telehealth: Payer: Self-pay

## 2017-03-10 DIAGNOSIS — I890 Lymphedema, not elsewhere classified: Secondary | ICD-10-CM

## 2017-03-10 NOTE — Telephone Encounter (Addendum)
Patient called stating that her arm is swollen and in pain because of the fluid in her shoulder. She needs an order for her arm to be wrapped at the mesothelioma(?) clinic in [place].She says its on [address]. Please advise

## 2017-03-10 NOTE — Telephone Encounter (Signed)
Placed to PT [address] and Notified Ms Finck.

## 2017-03-10 NOTE — Telephone Encounter (Signed)
May make referral to lymph edema clinic, outpatient rehabilitation . Occupational therapy or physical therapy [address]

## 2017-03-15 ENCOUNTER — Ambulatory Visit (HOSPITAL_BASED_OUTPATIENT_CLINIC_OR_DEPARTMENT_OTHER): Payer: Medicare Other | Admitting: Physical Medicine & Rehabilitation

## 2017-03-15 ENCOUNTER — Encounter: Payer: Medicare Other | Attending: Registered Nurse

## 2017-03-15 ENCOUNTER — Encounter: Payer: Self-pay | Admitting: Physical Medicine & Rehabilitation

## 2017-03-15 VITALS — BP 110/75 | HR 96 | Resp 14

## 2017-03-15 DIAGNOSIS — I890 Lymphedema, not elsewhere classified: Secondary | ICD-10-CM

## 2017-03-15 DIAGNOSIS — M546 Pain in thoracic spine: Secondary | ICD-10-CM | POA: Diagnosis not present

## 2017-03-15 DIAGNOSIS — G893 Neoplasm related pain (acute) (chronic): Secondary | ICD-10-CM | POA: Diagnosis not present

## 2017-03-15 DIAGNOSIS — G243 Spasmodic torticollis: Secondary | ICD-10-CM | POA: Diagnosis not present

## 2017-03-15 MED ORDER — OXYCODONE HCL 5 MG PO TABS: 5.0000 mg | ORAL_TABLET | Freq: Four times a day (QID) | ORAL | 0 refills | Status: DC | PRN

## 2017-03-15 NOTE — Patient Instructions (Signed)
You may consider using a sling on the right arm, but only if you are up on your feet for more than an hour .  I would not use the sling every day but only on days that he cannot get good relief using medications and stretching Controlling her lymphedema will likely improve some of the pain in the neck and shoulder area  You also have a frozen shoulder  Your clavicle xray was normal

## 2017-03-15 NOTE — Progress Notes (Signed)
Subjective:    Patient ID: Karen Hopkins, female    DOB: [birth year], 41 y.o.   MRN: [id number]  HPI  She complains of increased swelling in the right upper extremity. This is above the elbow between the shoulder and the elbow. Patient had a bug bite in the forearm, but there is no much swelling in that area. There's been no redness or induration. No fevers. No progressive increase Patient has an appointment to see lymphedema clinic physical therapy tomorrow.  Pain Inventory Average Pain 8 Pain Right Now 6 My pain is constant, sharp, burning, dull, stabbing, tingling and aching  In the last 24 hours, has pain interfered with the following? General activity 8 Relation with others 8 Enjoyment of life 8 What TIME of day is your pain at its worst? morning, night Sleep (in general) Poor  Pain is worse with: bending, inactivity, standing and some activites Pain improves with: rest, therapy/exercise, medication, TENS and injections Relief from Meds: 6  Mobility walk without assistance how many minutes can you walk? 30 ability to climb steps?  yes do you drive?  yes Do you have any goals in this area?  yes  Function disabled: date disabled . I need assistance with the following:  household duties  Neuro/Psych numbness tingling spasms confusion depression anxiety  Prior Studies Any changes since last visit?  no  Physicians involved in your care Any changes since last visit?  no   Family History  Problem Relation Age of Onset  . Hypertension Mother   . Diabetes type II Father   . Prostate cancer Father   . Stroke Neg Hx    Social History   Social History  . Marital status: Single    Spouse name: N/A  . Number of children: 3  . Years of education: 70   Occupational History  . Disability     Social History Main Topics  . Smoking status: Former Smoker    Packs/day: 0.25    Years: 5.00    Types: Cigarettes    Start date: 06/07/2002    Quit date:  04/26/2010  . Smokeless tobacco: Never Used  . Alcohol use No  . Drug use: No  . Sexual activity: Not Currently    Birth control/ protection: Surgical   Other Topics Concern  . None   Social History Narrative   Lives with kids   Caffeine use: none    Past Surgical History:  Procedure Laterality Date  . BREAST BIOPSY Right 08/26/2009  . BREAST REDUCTION SURGERY Left 08/23/2013   Procedure: LEFT BREAST REDUCTION  ;  Surgeon: Theodoro Kos, DO;  Location: [facility];  Service: Plastics;  Laterality: Left;  . CESAREAN SECTION  07/15/2001; 03/18/2004; 11/21/2008  . LATISSIMUS FLAP TO BREAST Right 03/12/2013   Procedure: RIGHT BREAST LATISSIMUS FLAP WITH EXPANDER PLACEMENT;  Surgeon: Theodoro Kos, DO;  Location: [facility];  Service: Plastics;  Laterality: Right;  . LIPOSUCTION Bilateral 08/23/2013   Procedure: LIPOSUCTION;  Surgeon: Theodoro Kos, DO;  Location: [facility];  Service: Plastics;  Laterality: Bilateral;  . MASTECTOMY Right 2011  . MODIFIED RADICAL MASTECTOMY Right 03/11/2010  . NASAL SEPTUM SURGERY    . PORT-A-CATH REMOVAL Left 12/01/2010  . PORTACATH PLACEMENT Left 09/12/2009  . REDUCTION MAMMAPLASTY Left 08/2013  . REMOVAL OF TISSUE EXPANDER AND PLACEMENT OF IMPLANT Right 08/23/2013   Procedure: REMOVAL RIGHT TISSUE EXPANDER AND PLACEMENT OF IMPLANT TO RIGHT BREAST ;  Surgeon: Theodoro Kos, DO;  Location:  [facility];  Service: Plastics;  Laterality: Right;  . SUPRA-UMBILICAL HERNIA  7425  . TUBAL LIGATION  11/21/2008   Past Medical History:  Diagnosis Date  . Anxiety   . Cancer ([place])   . Depression   . Essential hypertension 11/05/2015  . History of breast cancer 2011   right  . History of chemotherapy 2011  . History of radiation therapy 2011  . Hypertension    under control with med., has been on med. x 2 yr.  . Lymphedema of arm    right; no BP or puncture to right arm  . Personal history of chemotherapy 09/16/2009   rt  breast  . Personal history of radiation therapy 05/2010   rt breast  . Pneumonia   . Seasonal allergies   . Sinus headache    BP 110/75 (BP Location: Left Arm, Patient Position: Sitting, Cuff Size: Large)   Pulse 96   Resp 14   SpO2 98%   Opioid Risk Score:   Fall Risk Score:  `1  Depression screen PHQ 2/9  Depression screen [facility] 2/9 08/31/2016 07/22/2016 07/20/2016 07/08/2016 08/04/2015 06/25/2015 03/04/2015  Decreased Interest 0 0 0 0 0 0 0  Down, Depressed, Hopeless 0 0 0 0 0 0 0  PHQ - 2 Score 0 0 0 0 0 0 0  Some recent data might be hidden    Review of Systems  Constitutional: Positive for unexpected weight change.  HENT: Negative.   Eyes: Negative.   Respiratory: Negative.   Cardiovascular: Positive for leg swelling.  Gastrointestinal: Positive for constipation.  Endocrine: Negative.   Genitourinary: Negative.   Musculoskeletal: Positive for arthralgias, back pain, myalgias and neck pain.       Spasms   Skin: Negative.   Allergic/Immunologic: Negative.   Neurological: Positive for numbness.       Tingling  Psychiatric/Behavioral: Positive for confusion and dysphoric mood. The patient is nervous/anxious.   All other systems reviewed and are negative.      Objective:   Physical Exam  Constitutional: She is oriented to [place], place, and time. She appears well-developed and well-nourished.  HENT:  Head: Normocephalic and atraumatic.  Eyes: Pupils are equal, round, and reactive to light. Conjunctivae and EOM are normal.  Neck: Normal range of motion.  Neurological: She is alert and oriented to [place], place, and time.  Psychiatric: She has a normal mood and affect.  Nursing note and vitals reviewed.  She has some prominence of the right scalenes as well as right levator scapula, the right trapezius is tender to palpation. No tenderness over the sternocleidomastoid  Her right shoulder internal/external rotation is reduced, she has abduction to around 100. Right upper  extremity shows no erythema. There is no induration. No palpable axillary nodes, she does have lymphedema between the elbow and the shoulder       Assessment & Plan:  1.  Cervical dystonia- Chronic neck pain-Obtains some relief with Botox. We'll repeat same dosing and muscle selection as previous injection on 01/04/2017  2. Neoplasm related pain neck and shoulder area this is chronic, multifactorial, status post radiation treatment, right breast, she is in remission, but still gets goserelin treatments as an outpatient Continue oxycodone 5 mg 4 times a day  We looked at her overdose risk on the PMP aware website, she is right at the borderline between normal  and a 10 time excess risk. Score 200 This could be decreased if she reduces or weans off the alprazolam and/or the oxycodone.  Continue opioid monitoring program. This consists of regular clinic visits, examinations, urine drug screen, pill counts as well as use of [place] controlled substance reporting System.  3. Adhesive capsulitis, chronic, not a good candidate for injections given her lymphedema  4. Lymphedema right upper extremity. Referral to outpatient therapy to help with control.  Over half of the 25 min visit was spent counseling and coordinating care.

## 2017-03-16 ENCOUNTER — Ambulatory Visit: Payer: Medicare Other | Attending: Physical Medicine & Rehabilitation | Admitting: Physical Therapy

## 2017-03-16 DIAGNOSIS — M25511 Pain in right shoulder: Secondary | ICD-10-CM | POA: Insufficient documentation

## 2017-03-16 DIAGNOSIS — G8929 Other chronic pain: Secondary | ICD-10-CM

## 2017-03-16 DIAGNOSIS — R600 Localized edema: Secondary | ICD-10-CM | POA: Insufficient documentation

## 2017-03-16 DIAGNOSIS — I972 Postmastectomy lymphedema syndrome: Secondary | ICD-10-CM

## 2017-03-16 NOTE — Therapy (Signed)
[place], [place], [address] Phone: [phone]   Fax:  [phone]  Physical Therapy Evaluation  Patient Details  Name: Karen Hopkins MRN: [id number] Date of Birth: 07-Jul-1975 Referring Provider: Dr. Alysia Penna  Encounter Date: 03/16/2017      PT End of Session - 03/16/17 1650    Visit Number 1   Number of Visits 19   Date for PT Re-Evaluation 05/21/17   PT Start Time 4627   PT Stop Time 1519   PT Time Calculation (min) 42 min   Activity Tolerance Patient tolerated treatment well   Behavior During Therapy [facility] for tasks assessed/performed      Past Medical History:  Diagnosis Date  . Anxiety   . Cancer ([place])   . Depression   . Essential hypertension 11/05/2015  . History of breast cancer 2011   right  . History of chemotherapy 2011  . History of radiation therapy 2011  . Hypertension    under control with med., has been on med. x 2 yr.  . Lymphedema of arm    right; no BP or puncture to right arm  . Personal history of chemotherapy 09/16/2009   rt breast  . Personal history of radiation therapy 05/2010   rt breast  . Pneumonia   . Seasonal allergies   . Sinus headache     Past Surgical History:  Procedure Laterality Date  . BREAST BIOPSY Right 08/26/2009  . BREAST REDUCTION SURGERY Left 08/23/2013   Procedure: LEFT BREAST REDUCTION  ;  Surgeon: Theodoro Kos, DO;  Location: [facility];  Service: Plastics;  Laterality: Left;  . CESAREAN SECTION  07/15/2001; 03/18/2004; 11/21/2008  . LATISSIMUS FLAP TO BREAST Right 03/12/2013   Procedure: RIGHT BREAST LATISSIMUS FLAP WITH EXPANDER PLACEMENT;  Surgeon: Theodoro Kos, DO;  Location: [facility];  Service: Plastics;  Laterality: Right;  . LIPOSUCTION Bilateral 08/23/2013   Procedure: LIPOSUCTION;  Surgeon: Theodoro Kos, DO;  Location: [facility];  Service: Plastics;  Laterality: Bilateral;  . MASTECTOMY Right  2011  . MODIFIED RADICAL MASTECTOMY Right 03/11/2010  . NASAL SEPTUM SURGERY    . PORT-A-CATH REMOVAL Left 12/01/2010  . PORTACATH PLACEMENT Left 09/12/2009  . REDUCTION MAMMAPLASTY Left 08/2013  . REMOVAL OF TISSUE EXPANDER AND PLACEMENT OF IMPLANT Right 08/23/2013   Procedure: REMOVAL RIGHT TISSUE EXPANDER AND PLACEMENT OF IMPLANT TO RIGHT BREAST ;  Surgeon: Theodoro Kos, DO;  Location: [facility];  Service: Plastics;  Laterality: Right;  . SUPRA-UMBILICAL HERNIA  0350  . TUBAL LIGATION  11/21/2008    There were no vitals filed for this visit.       Subjective Assessment - 03/16/17 1445    Subjective "I've got a lot of things going on." Recently something bit me; can that make you swell?  Back of hand and forearm got big from the bite (near axilla anteriorly). I was trying to control it myself but I couldn't. I have frozen shoulder now. Had lost 40 lbs. and some is coming back as fluid in abdomen.   Pertinent History Right breast cancer with mastectomy 2011; reconstruction; left breast reduction; h/o right UE lymphedema and right upper quadrant pain, treated in this clinic several times before.   Patient Stated Goals I don't want to be here--I was trying not to come--I'm trying to help myself now.   Currently in Pain? Yes   Pain Score 10-Worst pain ever   Pain Location Neck  and right shoulder   Pain Type Chronic pain   Aggravating Factors  I don't know: when I turn (head to opposite side)   Pain Relieving Factors nothing            [facility] PT Assessment - 03/16/17 0001      Assessment   Medical Diagnosis right breast cancer   Referring Provider Dr. Alysia Penna   Onset Date/Surgical Date 08/06/10   Hand Dominance Right   Prior Therapy yes     Precautions   Precaution Comments cancer precautions     Restrictions   Weight Bearing Restrictions No     Balance Screen   Has the patient fallen in the past 6 months No   Has the patient had a decrease in  activity level because of a fear of falling?  No   Is the patient reluctant to leave their home because of a fear of falling?  No     Home Environment   Living Environment Private residence   Living Arrangements Children  3 sons   Type of [place] Access Level entry   Home Layout One level     Prior Function   Level of [place] Independent   Leisure Did the [employer] 5K last Saturday; couldn't get out of bed after that because she was tired and hurt. Is trying to walk more now.     Cognition   Overall Cognitive Status Within Functional Limits for tasks assessed     Observation/Other Assessments   Other Surveys  --  lymphedema life impact scale score of 59 = 87% impairment     Palpation   Palpation comment abdomen palpated, but it is apparent whether this is edema or not           LYMPHEDEMA/ONCOLOGY QUESTIONNAIRE - 03/16/17 1457      Right Upper Extremity Lymphedema   15 cm Proximal to Olecranon Process 45.5 cm   10 cm Proximal to Olecranon Process 45.6 cm   Olecranon Process 31.2 cm   15 cm Proximal to Ulnar Styloid Process 29.4 cm   10 cm Proximal to Ulnar Styloid Process 24.4 cm   Just Proximal to Ulnar Styloid Process 17.7 cm   Across Hand at [employer] 20.8 cm   At [place] of 2nd Digit 6.3 cm   Other 135.3 cm. around abomen at level of superior umbilicus  after patient exhaled   Other 135.1 cm. around abdomen at 10 cm. superior to top of umbilicus  after patient exhaled     Left Upper Extremity Lymphedema   15 cm Proximal to Olecranon Process 43.2 cm   10 cm Proximal to Olecranon Process 42.8 cm   Olecranon Process 30 cm   15 cm Proximal to Ulnar Styloid Process 29.2 cm   10 cm Proximal to Ulnar Styloid Process 25 cm   Just Proximal to Ulnar Styloid Process 17.7 cm   Across Hand at [employer] 20.5 cm   At [place] of 2nd Digit 6.2 cm         Objective measurements completed on examination: See above findings.          [place]  Adult PT Treatment/Exercise - 03/16/17 0001      Manual Therapy   Edema Management tried a [employer] abdominal binder on patient, which fit.  Instructed patient to try using this for abdominal edema control.  PT Education - 03/16/17 1648    Education provided Yes   Education Details use of [employer] band   [place](s) Educated Patient   Methods Explanation;Demonstration   Comprehension Verbalized understanding           Short Term Clinic Goals - 03/16/17 1701      CC Short Term Goal  #1   Title Decrease circumference measurement at 10 cm proximal to olecranon on the right to 45 cm or less.   Baseline 45.6 on right compared to 42.8 on left at eval   Time 3   Period Weeks   Status New   Target Date 04/26/17     CC Short Term Goal  #2   Title Decrease circumference measurement of abdomen at 10 cm. superior to upper tip of umbilicus to 235 cm. or less   Baseline 135.1 cm. at eval   Time 3   Period Weeks   Status New   Target Date 04/26/17             Long Term Clinic Goals - 03/16/17 1705      CC Long Term Goal  #1   Title Decrease circumference at 10 cm. superior to right olecranon to 44.5 cm. or less   Baseline 45.6 on right compared to 42.8 on left at eval   Time 6   Period Weeks   Status New   Target Date 05/21/17     CC Long Term Goal  #2   Title decrease circumference of abdomen at 10 cm. superior to upper tip of umbilicus to 361 cm. or less   Baseline 135.1 cm. at eval   Time 6   Period Weeks   Status New   Target Date 05/21/17     CC Long Term Goal  #3   Title Reduce Lymphedema Life Impact Scale to less than 60 percent impairment.   Baseline 87% at eval on 03/16/17   Time 6   Period Weeks   Status New   Target Date 05/21/17             Plan - 03/16/17 1653    Clinical Impression Statement Patient is well known to this clinic.  She has a h/o right breast cancer with mastectomy, chemo and radiation.  She also has a  h/o pain in right upper quadrant and right UE lymphedema.  She returns with report of right UE lymphedema and abdominal swelling.  Right UE measurements are larger than left.  Abdominal measurements were also taken; pt. says that abdominal swelling came on suddently about two weeks ago with no clear trigger. She continues to have significant pain complaints and now her diagnosis includes torticollis and cervical dystonia; however, focus was on swelling complaints today.   History and Personal Factors relevant to plan of care: Significant chronic pain, now with cervical dystonia; single mother with three boys at home   Clinical Presentation Evolving   Clinical Presentation due to: new abdominal swelling from unknown cause   Clinical Decision Making Low   Rehab Potential Good   Clinical Impairments Affecting Rehab Potential chronic pain issues   PT Frequency 3x / week   PT Duration 6 weeks   PT Treatment/Interventions ADLs/Self Care Home Management;Manual techniques;Manual lymph drainage;Compression bandaging;Taping;Patient/family education;Passive range of motion;Scar mobilization;Therapeutic exercise   PT Next Visit Plan Check whether patient tried [employer] abdominal binder and how it did for her. Begin complete decongestive therapy for right UE and abdomen.     Consulted  and Agree with Plan of Care Patient      Patient will benefit from skilled therapeutic intervention in order to improve the following deficits and impairments:  Increased edema, Pain, Decreased knowledge of use of DME  Visit Diagnosis: Postmastectomy lymphedema - Plan: PT plan of care cert/re-cert  Chronic right shoulder pain - Plan: PT plan of care cert/re-cert  Localized edema - Plan: PT plan of care cert/re-cert      G-Codes - 09/73/53 1708    Functional Assessment Tool Used (Outpatient Only) lymphedema life impact scale   Functional Limitation Self care   Self Care Current Status (G9924) At least 80 percent but  less than 100 percent impaired, limited or restricted   Self Care Goal Status (Q6834) At least 40 percent but less than 60 percent impaired, limited or restricted       Problem List Patient Active Problem List   Diagnosis Date Noted  . Dyspnea 09/23/2016  . Eczema 08/31/2016  . Essential hypertension 11/05/2015  . Cervical dystonia 08/25/2015  . Headache 07/09/2015  . Myofacial muscle pain 04/29/2015  . Adhesive capsulitis of right shoulder 04/29/2015  . Type 2 diabetes mellitus without complication ([place]) 19/62/2297  . Lower extremity edema 10/01/2014  . Malignant neoplasm of overlapping sites of right breast in female, estrogen receptor positive ([place]) 05/24/2014  . Myalgia and myositis 05/17/2014  . Neoplasm related pain 03/12/2014  . CN (constipation) 11/26/2013  . H/O reduction mammoplasty 10/30/2013  . Seizure ([place]) 10/06/2013  . Status post bilateral breast implants 08/31/2013  . Acquired absence of breast and absent nipple 08/23/2013  . S/P breast reconstruction, right 08/23/2013  . AC joint arthropathy 07/02/2013  . Routine adult health maintenance 05/29/2013  . Hypokalemia 05/21/2013  . Chronic pain 04/19/2013  . Elevated blood sugar 03/09/2013  . Acute bronchitis 02/22/2013  . Lymphedema 05/24/2011  . RHINOSINUSITIS, CHRONIC 04/08/2010  . Obesity 08/04/2006  . DEPRESSIVE DISORDER, NOS 08/04/2006  . Allergic rhinitis 08/04/2006    Karen Hopkins 03/16/2017, 5:12 PM  [place], [place], [address] Phone: [phone]   Fax:  [phone]  Name: Karen Hopkins MRN: [id number] Date of Birth: 1976/03/31  Karen Hopkins, PT 03/16/17 5:12 PM

## 2017-03-18 NOTE — Progress Notes (Signed)
[place]  [phone] [phone]   ID: Karen Hopkins   DOB: 05-Apr-1976  MR#: [id number]  RKY#:[id number]  PCP: Karen Rio, MD GYN: SU: Karen Coho, MD;  Karen Kos, MD OTHER MD: Karen Penna, MD;  Karen Nakayama, MD  CHIEF COMPLAINT:  Locally advanced right breast cancer  CURRENT TREATMENT: Anastrozole, goserelin  HISTORY OF PRESENT ILLNESS: From the original intake note:  The patient noted swelling and redness, as well as some tenderness, over her right breast for some time. She thought this might be related to pregnancy, since she delivered her third child about 8 months ago. As the problem did not improve, she brought it to her primary care physician's attention. He treated her with Bactrim, which she says she could not tolerate because of nausea and vomiting, but which in any case did not resolve the problem, so he set her up for mammography at [facility] 08/26/2009. Dr. Sadie Hopkins found by exam marked skin thickening and erythema of the right breast extending pretty much throughout the breast. By mammography there were scattered fibroglandular densities, and an asymmetric density in the right upper outer quadrant with suspicious microcalcifications. There were also enlarged right axillary lymph nodes. The left appeared normal. By ultrasound there was an ill defined area of hypoechoic tissue in the right upper outer quadrant that was difficult to measure. There were also abnormal right axillary lymph nodes.  Ultrasound guided biopsy was performed the same day, and showed [phone] a poorly differentiated invasive ductal carcinoma, which was estrogen receptor poor at 3%, progesterone receptor positive at 14% with an elevated proliferation marker at 50%, but importantly with strong amplification of HER-2 by CISH with a ratio of 5.20.  Bilateral breast MRIs were obtained 09/02/2009. This showed an area up to 10.6 cm in the right  breast showing confluent mass and non-mass enhancement. There was marked skin thickening involving the entire right breast, and numerous bulky right axillary lymph nodes were identified. The left breast was unremarkable, and there was no evidence of internal mammary lymph node involvement.  Patient was treated in the neoadjuvant setting with 4 dose dense cycles of doxorubicin and cyclophosphamide, followed by 12 weekly doses of paclitaxel and trastuzumab. Karen Hopkins underwent definitive right modified radical mastectomy in October 2011, with a residual 2.5 mm area of tumor in the breast, grade 3, and one of 21 lymph nodes involved.   Her subsequent history is as detailed below  INTERVAL HISTORY: Karen Hopkins returns today for follow-up and treatment of her estrogen receptor positive breast cancer. She continues on goserelin, with a dose due today. She complains of no problems from those injections  She continues on anastrozole. She also tolerates that well.   REVIEW OF SYSTEMS: Karen Hopkins reports she is not doing well today. She endorses pain to her right breast and axilla area. She also endorses pain to her neck. She has been receiving botox with relief from Karen Hopkins. He has been prescribing her oxycodone for her pain. Pt has become constipated from the medication and states she is using MiraLAX with mild relief. Pt notes abdominal bloating. She also endorses fatigue. She denies unusual headaches, visual changes, nausea, vomiting, or dizziness. There has been no unusual cough, phlegm production, or pleurisy. This been no change in bladder habits. She denies unexplained weight loss, bleeding, rash, or fever. A detailed review of systems was otherwise entirely negative.    PAST MEDICAL HISTORY: Past Medical History:  Diagnosis Date  . Anxiety   .  Cancer ([place])   . Depression   . Essential hypertension 11/05/2015  . History of breast cancer 2011   right  . History of chemotherapy 2011  . History of  radiation therapy 2011  . Hypertension    under control with med., has been on med. x 2 yr.  . Lymphedema of arm    right; no BP or puncture to right arm  . Personal history of chemotherapy 09/16/2009   rt breast  . Personal history of radiation therapy 05/2010   rt breast  . Pneumonia   . Seasonal allergies   . Sinus headache     PAST SURGICAL HISTORY: Past Surgical History:  Procedure Laterality Date  . BREAST BIOPSY Right 08/26/2009  . BREAST REDUCTION SURGERY Left 08/23/2013   Procedure: LEFT BREAST REDUCTION  ;  Surgeon: Karen Kos, DO;  Location: [facility];  Service: Plastics;  Laterality: Left;  . CESAREAN SECTION  07/15/2001; 03/18/2004; 11/21/2008  . LATISSIMUS FLAP TO BREAST Right 03/12/2013   Procedure: RIGHT BREAST LATISSIMUS FLAP WITH EXPANDER PLACEMENT;  Surgeon: Karen Kos, DO;  Location: [facility];  Service: Plastics;  Laterality: Right;  . LIPOSUCTION Bilateral 08/23/2013   Procedure: LIPOSUCTION;  Surgeon: Karen Kos, DO;  Location: [facility];  Service: Plastics;  Laterality: Bilateral;  . MASTECTOMY Right 2011  . MODIFIED RADICAL MASTECTOMY Right 03/11/2010  . NASAL SEPTUM SURGERY    . PORT-A-CATH REMOVAL Left 12/01/2010  . PORTACATH PLACEMENT Left 09/12/2009  . REDUCTION MAMMAPLASTY Left 08/2013  . REMOVAL OF TISSUE EXPANDER AND PLACEMENT OF IMPLANT Right 08/23/2013   Procedure: REMOVAL RIGHT TISSUE EXPANDER AND PLACEMENT OF IMPLANT TO RIGHT BREAST ;  Surgeon: Karen Kos, DO;  Location: [facility];  Service: Plastics;  Laterality: Right;  . SUPRA-UMBILICAL HERNIA  7628  . TUBAL LIGATION  11/21/2008    FAMILY HISTORY Family History  Problem Relation Age of Onset  . Hypertension Mother   . Diabetes type II Father   . Prostate cancer Father   . Stroke Neg Hx   There is no history of breast or ovarian cancer in the immediate family, but of the patient's mother's mother's six sisters, two (the patient's great-aunts)  had ovarian cancer.  GYNECOLOGIC HISTORY:   (Updated 11/26/2013) The patient is GX, P3. First child was premature. Age at first delivery, 41 years old.   she status post tubal ligation. She continues to have menstrual cycles, although irregularly.  SOCIAL HISTORY: (Updated 11/26/2013) She is disabled. Candee's children are [name], and [name]. They are 14, 12 and 7 as of September 2017. They are all boys, all at home. The patient attends a non-denominational church in [place], which she considers her home, but she is currently living in [place].   ADVANCED DIRECTIVES: not in place  HEALTH MAINTENANCE:  (Updated 11/26/2013) Social History  Substance Use Topics  . Smoking status: Former Smoker    Packs/day: 0.25    Years: 5.00    Types: Cigarettes    Start date: 06/07/2002    Quit date: 04/26/2010  . Smokeless tobacco: Never Used  . Alcohol use No    Colonoscopy: Never  PAP: April 2015  Bone density: Never  Lipid panel: August 2014    Allergies  Allergen Reactions  . Penicillins Swelling    FACIAL SWELLING  . Lyrica [Pregabalin] Nausea Only    .  Marland Kitchen Meloxicam Nausea Only  . Robaxin [Methocarbamol] Nausea Only    Current Outpatient Prescriptions  Medication  Sig Dispense Refill  . albuterol (PROAIR HFA) 108 (90 Base) MCG/ACT inhaler Inhale 2 puffs into the lungs every 6 (six) hours as needed for wheezing or shortness of breath. 18 g 3  . albuterol (PROVENTIL) (2.5 MG/3ML) 0.083% nebulizer solution Take 3 mLs (2.5 mg total) by nebulization every 6 (six) hours as needed for wheezing or shortness of breath. 75 mL 3  . ALPRAZolam (ALPRAZOLAM XR) 1 MG 24 hr tablet Take 1 tablet (1 mg total) by mouth daily. 30 tablet 2  . anastrozole (ARIMIDEX) 1 MG tablet Take 1 tablet (1 mg total) by mouth daily. 90 tablet 4  . carvedilol (COREG) 6.25 MG tablet Take 1 tablet (6.25 mg total) by mouth 2 (two) times daily with a meal. 60 tablet 5  . diclofenac sodium (VOLTAREN) 1 % GEL  Apply 4 g topically 4 (four) times daily. Use as directed 3 Tube 4  . DULoxetine (CYMBALTA) 30 MG capsule Take 90 mg by mouth daily.     . fluticasone (FLOVENT HFA) 220 MCG/ACT inhaler Inhale 2 puffs into the lungs 2 (two) times daily. 1 Inhaler 2  . furosemide (LASIX) 40 MG tablet Take 1 tablet (40 mg total) by mouth daily. (Patient taking differently: Take 80 mg by mouth 2 (two) times daily. ) 90 tablet 3  . goserelin (ZOLADEX) 3.6 MG injection Inject 3.6 mg into the skin every 28 (twenty-eight) days.    . metFORMIN (GLUCOPHAGE) 500 MG tablet TAKE ONE (1) TABLET BY MOUTH TWO (2) TIMES DAILY WITH A MEAL 60 tablet 3  . metoCLOPramide (REGLAN) 10 MG tablet Take 1 tablet (10 mg total) by mouth every 8 (eight) hours as needed. 60 tablet 1  . oxyCODONE (OXY IR/ROXICODONE) 5 MG immediate release tablet Take 1 tablet (5 mg total) by mouth every 6 (six) hours as needed for severe pain. 120 tablet 0  . polyethylene glycol powder (GLYCOLAX) powder Take 17 g by mouth daily. 500 g 5  . potassium chloride (K-DUR) 10 MEQ tablet Take 2 tablets (20 mEq total) by mouth 3 (three) times daily. 180 tablet 1  . tiZANidine (ZANAFLEX) 4 MG tablet TAKE ONE (1) TABLET BY MOUTH 3 TIMES DAILY 90 tablet 3  . topiramate (TOPAMAX) 200 MG tablet Take 1 tablet (200 mg total) by mouth at bedtime. 30 tablet 12  . traZODone (DESYREL) 100 MG tablet     . triamcinolone ointment (KENALOG) 0.5 % Apply 1 application topically 2 (two) times daily. 30 g 0  . VYVANSE 50 MG capsule      No current facility-administered medications for this visit.    Facility-Administered Medications Ordered in Other Visits  Medication Dose Route Frequency Provider Last Rate Last Dose  . goserelin (ZOLADEX) injection 3.6 mg  3.6 mg Subcutaneous Once Ranveer Wahlstrom, Virgie Dad, MD        OBJECTIVE: Delila Pereyra American woman  Who was tearful during today's exam Vitals:   03/22/17 1048  BP: (!) 117/94  Pulse: (!) 105  Resp: 20  Temp: 97.9 F (36.6 C)    SpO2: 100%     Body mass index is 40.29 kg/m.    ECOG FS: 2   Filed Weights   03/22/17 1048  Weight: 234 lb 11.2 oz (106.5 kg)   Sclerae unicteric, EOMs intact Oropharynx clear and moist No cervical or supraclavicular adenopathy; limited cervical mobility Lungs no rales or rhonchi Heart regular rate and rhythm Abd soft, nontender, positive bowel sounds MSK no focal spinal tenderness, Grade 1 right upper  extremity lymphedema Neuro: nonfocal, well oriented, appropriate affect Breasts: Status post right mastectomy with implant reconstruction. The area above the breast is very painful to her. There is no palpable mass.  The right axilla is benign. Left breast is unremarkable   LAB RESULTS: Lab Results  Component Value Date   WBC 10.2 03/22/2017   NEUTROABS 5.5 03/22/2017   HGB 13.3 03/22/2017   HCT 40.0 03/22/2017   MCV 85.4 03/22/2017   PLT 314 03/22/2017      Chemistry      Component Value Date/Time   NA 140 02/22/2017 1134   K 3.5 02/22/2017 1134   CL 107 (H) 07/22/2016 1128   CL 105 09/27/2012 1044   CO2 22 02/22/2017 1134   BUN 7.6 02/22/2017 1134   CREATININE 0.9 02/22/2017 1134      Component Value Date/Time   CALCIUM 9.7 02/22/2017 1134   ALKPHOS 117 02/22/2017 1134   AST 12 02/22/2017 1134   ALT 12 02/22/2017 1134   BILITOT 0.29 02/22/2017 1134     STUDIES: Dg Clavicle Right  Result Date: 02/24/2017 CLINICAL DATA:  Sternal costal pain. EXAM: RIGHT CLAVICLE - 2+ VIEWS COMPARISON:  No recent. FINDINGS: No acute bony or joint abnormality identified. No evidence of fracture or dislocation. Surgical clips right chest. IMPRESSION: No acute abnormality. Electronically Signed   By: Marcello Moores  Register   On: 02/24/2017 14:32   ASSESSMENT: 41 y.o.  BRCA1 and BRCA2 negative [place] woman, status post right breast biopsy in March 2011 for a high-grade invasive ductal carcinoma involving overlapping sites of the right breast, ER positive 3%, PR positive 14%, strongly  HER2/neu positive with MIB-1 of 15%. Biopsy proven lymph node involvement at presentation. Changes consistent with inflammatory carcinoma.   1. Treated neoadjuvantly, status post 4 dose-dense cycles of doxorubicin/cyclophosphamide, followed by 12 weekly doses of paclitaxel with trastuzumab completed September 2011.   2. Trastuzumab was then continued for a total of 1 year [to June 2012]. Post-treatment echo showed a well-preserved ejection fraction.   3. Status post right modified radical mastectomy in October 2011 showing a ypT1a ypN1 invasive ductal carcinoma, grade 3.  Status post right reconstruction with implant, March 2015   4. postmastectomy radiation completed in January 2012  5. on tamoxifen beginning January of 2012 stopped somewhere in Spring/Summer 2016  (a) anastrozole started November 2016.  6. other problems include chronic postsurgical pain, chronic right upper extremity lymphedema, and chronic depression  7. Goserelin started 09/24/2014, repeated every 4 weeks  PLAN: Niyla is now 7 years out from definitive surgery for her breast cancer with no evidence of disease recurrence. This is very favorable.  She continues on goserelin and the plan is to continue that at least until natural menopause or until she has bilateral salpingo-oophorectomy. She also continues on anastrozole, with good tolerance.  Her pain is disabling and she continues to work closely with Dr. Barbaraann Cao regarding that. I'm going to obtain a repeat CT scan of the chest just to make sure there is no evidence of disease recurrence at this point  Otherwise she will see [place] again in 3 months. She knows to call for any problems that may develop before that visit.   Darrelyn Morro, Virgie Dad, MD  03/22/17 11:12 AM Medical Oncology and Hematology [facility] [address] [place], [place] [address] Tel. [phone]    Fax. [phone]  This document serves as a record of services personally  performed by Chauncey Cruel, MD. It was created  on her behalf by Margit Banda, a trained medical scribe. The creation of this record is based on the scribe's personal observations and the provider's statements to them. This document has been checked and approved by the attending provider.

## 2017-03-22 ENCOUNTER — Other Ambulatory Visit (HOSPITAL_BASED_OUTPATIENT_CLINIC_OR_DEPARTMENT_OTHER): Payer: Medicare Other

## 2017-03-22 ENCOUNTER — Telehealth: Payer: Self-pay | Admitting: Oncology

## 2017-03-22 ENCOUNTER — Ambulatory Visit (HOSPITAL_BASED_OUTPATIENT_CLINIC_OR_DEPARTMENT_OTHER): Payer: Medicare Other

## 2017-03-22 ENCOUNTER — Other Ambulatory Visit: Payer: Self-pay

## 2017-03-22 ENCOUNTER — Ambulatory Visit (HOSPITAL_BASED_OUTPATIENT_CLINIC_OR_DEPARTMENT_OTHER): Payer: Medicare Other | Admitting: Oncology

## 2017-03-22 VITALS — BP 117/94 | HR 105 | Temp 97.9°F | Resp 20 | Ht 64.0 in | Wt 234.7 lb

## 2017-03-22 DIAGNOSIS — G243 Spasmodic torticollis: Secondary | ICD-10-CM

## 2017-03-22 DIAGNOSIS — Z5111 Encounter for antineoplastic chemotherapy: Secondary | ICD-10-CM

## 2017-03-22 DIAGNOSIS — N644 Mastodynia: Secondary | ICD-10-CM | POA: Diagnosis not present

## 2017-03-22 DIAGNOSIS — M7918 Myalgia, other site: Secondary | ICD-10-CM

## 2017-03-22 DIAGNOSIS — C50911 Malignant neoplasm of unspecified site of right female breast: Secondary | ICD-10-CM

## 2017-03-22 DIAGNOSIS — M542 Cervicalgia: Secondary | ICD-10-CM | POA: Diagnosis not present

## 2017-03-22 DIAGNOSIS — C50811 Malignant neoplasm of overlapping sites of right female breast: Secondary | ICD-10-CM

## 2017-03-22 DIAGNOSIS — M7501 Adhesive capsulitis of right shoulder: Secondary | ICD-10-CM

## 2017-03-22 DIAGNOSIS — R5383 Other fatigue: Secondary | ICD-10-CM | POA: Diagnosis not present

## 2017-03-22 DIAGNOSIS — Z170 Estrogen receptor positive status [ER+]: Principal | ICD-10-CM

## 2017-03-22 LAB — CBC WITH DIFFERENTIAL/PLATELET
BASO%: 0.3 % (ref 0.0–2.0)
BASOS ABS: 0 10*3/uL (ref 0.0–0.1)
EOS%: 5.9 % (ref 0.0–7.0)
Eosinophils Absolute: 0.6 10*3/uL — ABNORMAL HIGH (ref 0.0–0.5)
HEMATOCRIT: 40 % (ref 34.8–46.6)
HEMOGLOBIN: 13.3 g/dL (ref 11.6–15.9)
LYMPH#: 3.6 10*3/uL — AB (ref 0.9–3.3)
LYMPH%: 35.4 % (ref 14.0–49.7)
MCH: 28.4 pg (ref 25.1–34.0)
MCHC: 33.3 g/dL (ref 31.5–36.0)
MCV: 85.4 fL (ref 79.5–101.0)
MONO#: 0.5 10*3/uL (ref 0.1–0.9)
MONO%: 4.7 % (ref 0.0–14.0)
NEUT%: 53.7 % (ref 38.4–76.8)
NEUTROS ABS: 5.5 10*3/uL (ref 1.5–6.5)
Platelets: 314 10*3/uL (ref 145–400)
RBC: 4.69 10*6/uL (ref 3.70–5.45)
RDW: 15 % — AB (ref 11.2–14.5)
WBC: 10.2 10*3/uL (ref 3.9–10.3)

## 2017-03-22 LAB — COMPREHENSIVE METABOLIC PANEL
ALBUMIN: 3.8 g/dL (ref 3.5–5.0)
ALK PHOS: 108 U/L (ref 40–150)
ALT: 16 U/L (ref 0–55)
AST: 12 U/L (ref 5–34)
Anion Gap: 9 mEq/L (ref 3–11)
BILIRUBIN TOTAL: 0.25 mg/dL (ref 0.20–1.20)
BUN: 9.3 mg/dL (ref 7.0–26.0)
CALCIUM: 9.6 mg/dL (ref 8.4–10.4)
CO2: 24 mEq/L (ref 22–29)
CREATININE: 0.9 mg/dL (ref 0.6–1.1)
Chloride: 108 mEq/L (ref 98–109)
EGFR: >60 mL/min/{1.73_m2} (ref >60)
GLUCOSE: 90 mg/dL (ref 70–140)
POTASSIUM: 3.4 meq/L — AB (ref 3.5–5.1)
SODIUM: 142 meq/L (ref 136–145)
TOTAL PROTEIN: 8 g/dL (ref 6.4–8.3)

## 2017-03-22 MED ORDER — GOSERELIN ACETATE 3.6 MG ~~LOC~~ IMPL
3.6000 mg | DRUG_IMPLANT | Freq: Once | SUBCUTANEOUS | Status: AC
  Administered 2017-03-22: 3.6 mg via SUBCUTANEOUS
  Filled 2017-03-22: qty 3.6

## 2017-03-22 NOTE — Telephone Encounter (Signed)
Gave patient AVs and calendar of upcoming January appointments.  °

## 2017-03-22 NOTE — Patient Instructions (Signed)
Goserelin injection What is this medicine? GOSERELIN (GOE se rel in) is similar to a hormone found in the body. It lowers the amount of sex hormones that the body makes. Men will have lower testosterone levels and women will have lower estrogen levels while taking this medicine. In men, this medicine is used to treat prostate cancer; the injection is either given once per month or once every 12 weeks. A once per month injection (only) is used to treat women with endometriosis, dysfunctional uterine bleeding, or advanced breast cancer. This medicine may be used for other purposes; ask your health care provider or pharmacist if you have questions. What should I tell my health care provider before I take this medicine? They need to know if you have any of these conditions (some only apply to women): -diabetes -heart disease or previous heart attack -high blood pressure -high cholesterol -kidney disease -osteoporosis or low bone density -problems passing urine -spinal cord injury -stroke -tobacco smoker -an unusual or allergic reaction to goserelin, hormone therapy, other medicines, foods, dyes, or preservatives -pregnant or trying to get pregnant -breast-feeding How should I use this medicine? This medicine is for injection under the skin. It is given by a health care professional in a hospital or clinic setting. Men receive this injection once every 4 weeks or once every 12 weeks. Women will only receive the once every 4 weeks injection. Talk to your pediatrician regarding the use of this medicine in children. Special care may be needed. Overdosage: If you think you have taken too much of this medicine contact a poison control center or emergency room at once. NOTE: This medicine is only for you. Do not share this medicine with others. What if I miss a dose? It is important not to miss your dose. Call your doctor or health care professional if you are unable to keep an appointment. What may  interact with this medicine? -female hormones like estrogen -herbal or dietary supplements like black cohosh, chasteberry, or DHEA -female hormones like testosterone -prasterone This list may not describe all possible interactions. Give your health care provider a list of all the medicines, herbs, non-prescription drugs, or dietary supplements you use. Also tell them if you smoke, drink alcohol, or use illegal drugs. Some items may interact with your medicine. What should I watch for while using this medicine? Visit your doctor or health care professional for regular checks on your progress. Your symptoms may appear to get worse during the first weeks of this therapy. Tell your doctor or healthcare professional if your symptoms do not start to get better or if they get worse after this time. Your bones may get weaker if you take this medicine for a long time. If you smoke or frequently drink alcohol you may increase your risk of bone loss. A family history of osteoporosis, chronic use of drugs for seizures (convulsions), or corticosteroids can also increase your risk of bone loss. Talk to your doctor about how to keep your bones strong. This medicine should stop regular monthly menstration in women. Tell your doctor if you continue to menstrate. Women should not become pregnant while taking this medicine or for 12 weeks after stopping this medicine. Women should inform their doctor if they wish to become pregnant or think they might be pregnant. There is a potential for serious side effects to an unborn child. Talk to your health care professional or pharmacist for more information. Do not breast-feed an infant while taking this medicine. Men should   inform their doctors if they wish to father a child. This medicine may lower sperm counts. Talk to your health care professional or pharmacist for more information. What side effects may I notice from receiving this medicine? Side effects that you should  report to your doctor or health care professional as soon as possible: -allergic reactions like skin rash, itching or hives, swelling of the face, lips, or tongue -bone pain -breathing problems -changes in vision -chest pain -feeling faint or lightheaded, falls -fever, chills -pain, swelling, warmth in the leg -pain, tingling, numbness in the hands or feet -signs and symptoms of low blood pressure like dizziness; feeling faint or lightheaded, falls; unusually weak or tired -stomach pain -swelling of the ankles, feet, hands -trouble passing urine or change in the amount of urine -unusually high or low blood pressure -unusually weak or tired Side effects that usually do not require medical attention (report to your doctor or health care professional if they continue or are bothersome): -change in sex drive or performance -changes in breast size in both males and females -changes in emotions or moods -headache -hot flashes -irritation at site where injected -loss of appetite -skin problems like acne, dry skin -vaginal dryness This list may not describe all possible side effects. Call your doctor for medical advice about side effects. You may report side effects to FDA at 1-800-FDA-1088. Where should I keep my medicine? This drug is given in a hospital or clinic and will not be stored at home. NOTE: This sheet is a summary. It may not cover all possible information. If you have questions about this medicine, talk to your doctor, pharmacist, or health care provider.    2016, Elsevier/Gold Standard. (2013-07-31 11:10:35)  

## 2017-03-22 NOTE — Telephone Encounter (Signed)
03/21/17 prescription refill scanned in media °

## 2017-03-23 ENCOUNTER — Other Ambulatory Visit: Payer: Self-pay | Admitting: Family Medicine

## 2017-03-23 MED ORDER — TRIAMCINOLONE ACETONIDE 0.5 % EX OINT: 1.0000 "application " | TOPICAL_OINTMENT | Freq: Two times a day (BID) | CUTANEOUS | 0 refills | Status: DC

## 2017-03-28 ENCOUNTER — Other Ambulatory Visit: Payer: Self-pay

## 2017-03-28 NOTE — Telephone Encounter (Signed)
Prescription for xanax called in to [facility]

## 2017-03-29 ENCOUNTER — Ambulatory Visit (HOSPITAL_COMMUNITY)
Admission: RE | Admit: 2017-03-29 | Discharge: 2017-03-29 | Disposition: A | Discharge status: Home / Self Care | Payer: Medicare Other | Source: Ambulatory Visit | Attending: Oncology | Admitting: Oncology

## 2017-03-29 ENCOUNTER — Encounter (HOSPITAL_COMMUNITY): Payer: Self-pay | Admitting: Radiology

## 2017-03-29 DIAGNOSIS — C50811 Malignant neoplasm of overlapping sites of right female breast: Secondary | ICD-10-CM | POA: Diagnosis not present

## 2017-03-29 DIAGNOSIS — M7501 Adhesive capsulitis of right shoulder: Secondary | ICD-10-CM | POA: Diagnosis not present

## 2017-03-29 DIAGNOSIS — G243 Spasmodic torticollis: Secondary | ICD-10-CM | POA: Insufficient documentation

## 2017-03-29 DIAGNOSIS — R0602 Shortness of breath: Secondary | ICD-10-CM | POA: Insufficient documentation

## 2017-03-29 DIAGNOSIS — M7918 Myalgia, other site: Secondary | ICD-10-CM | POA: Diagnosis not present

## 2017-03-29 DIAGNOSIS — Z170 Estrogen receptor positive status [ER+]: Secondary | ICD-10-CM | POA: Diagnosis not present

## 2017-03-29 MED ORDER — IOPAMIDOL (ISOVUE-300) INJECTION 61%
INTRAVENOUS | Status: AC
  Administered 2017-03-29: 75 mL via INTRAVENOUS
  Filled 2017-03-29: qty 75

## 2017-03-29 MED ORDER — IOPAMIDOL (ISOVUE-300) INJECTION 61%
75.0000 mL | Freq: Once | INTRAVENOUS | Status: AC | PRN
  Administered 2017-03-29: 75 mL via INTRAVENOUS

## 2017-03-30 ENCOUNTER — Ambulatory Visit: Payer: Medicare Other

## 2017-03-30 DIAGNOSIS — R600 Localized edema: Secondary | ICD-10-CM | POA: Diagnosis not present

## 2017-03-30 DIAGNOSIS — M25511 Pain in right shoulder: Secondary | ICD-10-CM | POA: Diagnosis not present

## 2017-03-30 DIAGNOSIS — G8929 Other chronic pain: Secondary | ICD-10-CM | POA: Diagnosis not present

## 2017-03-30 DIAGNOSIS — I972 Postmastectomy lymphedema syndrome: Secondary | ICD-10-CM | POA: Diagnosis not present

## 2017-03-30 NOTE — Therapy (Signed)
[place], [place], [address] Phone: [phone]   Fax:  [phone]  Physical Therapy Treatment  Patient Details  Name: Karen Hopkins MRN: [id number] Date of Birth: [birth year] Referring Provider: Dr. Alysia Penna  Encounter Date: 03/30/2017      PT End of Session - 03/30/17 1432    Visit Number 2   Number of Visits 19   Date for PT Re-Evaluation 05/21/17   PT Start Time 1407  Pt arrived late   PT Stop Time 1437   PT Time Calculation (min) 30 min   Activity Tolerance Patient tolerated treatment well   Behavior During Therapy [facility] for tasks assessed/performed      Past Medical History:  Diagnosis Date  . Anxiety   . Cancer ([place])   . Depression   . Essential hypertension 11/05/2015  . History of breast cancer 2011   right  . History of chemotherapy 2011  . History of radiation therapy 2011  . Hypertension    under control with med., has been on med. x 2 yr.  . Lymphedema of arm    right; no BP or puncture to right arm  . Personal history of chemotherapy 09/16/2009   rt breast  . Personal history of radiation therapy 05/2010   rt breast  . Pneumonia   . Seasonal allergies   . Sinus headache     Past Surgical History:  Procedure Laterality Date  . BREAST BIOPSY Right 08/26/2009  . BREAST REDUCTION SURGERY Left 08/23/2013   Procedure: LEFT BREAST REDUCTION  ;  Surgeon: Theodoro Kos, DO;  Location: [facility];  Service: Plastics;  Laterality: Left;  . CESAREAN SECTION  07/15/2001; 03/18/2004; 11/21/2008  . LATISSIMUS FLAP TO BREAST Right 03/12/2013   Procedure: RIGHT BREAST LATISSIMUS FLAP WITH EXPANDER PLACEMENT;  Surgeon: Theodoro Kos, DO;  Location: [facility];  Service: Plastics;  Laterality: Right;  . LIPOSUCTION Bilateral 08/23/2013   Procedure: LIPOSUCTION;  Surgeon: Theodoro Kos, DO;  Location: [facility];  Service: Plastics;  Laterality: Bilateral;  .  MASTECTOMY Right 2011  . MODIFIED RADICAL MASTECTOMY Right 03/11/2010  . NASAL SEPTUM SURGERY    . PORT-A-CATH REMOVAL Left 12/01/2010  . PORTACATH PLACEMENT Left 09/12/2009  . REDUCTION MAMMAPLASTY Left 08/2013  . REMOVAL OF TISSUE EXPANDER AND PLACEMENT OF IMPLANT Right 08/23/2013   Procedure: REMOVAL RIGHT TISSUE EXPANDER AND PLACEMENT OF IMPLANT TO RIGHT BREAST ;  Surgeon: Theodoro Kos, DO;  Location: [facility];  Service: Plastics;  Laterality: Right;  . SUPRA-UMBILICAL HERNIA  5009  . TUBAL LIGATION  11/21/2008    There were no vitals filed for this visit.      Subjective Assessment - 03/30/17 1428    Subjective My abdominal swelling seems a little better this week but it's definitely still there. I've worn the binder since then some and I think it's helped.    Pertinent History Right breast cancer with mastectomy 2011; reconstruction; left breast reduction; h/o right UE lymphedema and right upper quadrant pain, treated in this clinic several times before.               LYMPHEDEMA/ONCOLOGY QUESTIONNAIRE - 03/30/17 1434      Right Upper Extremity Lymphedema   Other 129.4 cm. around abomen at level of superior umbilicus  after exhale   Other 127.9 cm. around abdomen at 10 cm. superior to top of umbilicus  after exhale  [facility] Adult PT Treatment/Exercise - 03/30/17 0001      Manual Therapy   Compression Bandaging Lotion applied, thick stockinette, Elastomull to fingers 1-4, Artiflex x1, 1-6, 1-10, and 2-12 cm short stretch compression bandages from hand to axilla (first 12-cm in herring bone fashion)                   Short Term Clinic Goals - 03/16/17 1701      CC Short Term Goal  #1   Title Decrease circumference measurement at 10 cm proximal to olecranon on the right to 45 cm or less.   Baseline 45.6 on right compared to 42.8 on left at eval   Time 3   Period Weeks   Status New   Target Date 04/26/17     CC  Short Term Goal  #2   Title Decrease circumference measurement of abdomen at 10 cm. superior to upper tip of umbilicus to 696 cm. or less   Baseline 135.1 cm. at eval   Time 3   Period Weeks   Status New   Target Date 04/26/17             Long Term Clinic Goals - 03/16/17 1705      CC Long Term Goal  #1   Title Decrease circumference at 10 cm. superior to right olecranon to 44.5 cm. or less   Baseline 45.6 on right compared to 42.8 on left at eval   Time 6   Period Weeks   Status New   Target Date 05/21/17     CC Long Term Goal  #2   Title decrease circumference of abdomen at 10 cm. superior to upper tip of umbilicus to 789 cm. or less   Baseline 135.1 cm. at eval   Time 6   Period Weeks   Status New   Target Date 05/21/17     CC Long Term Goal  #3   Title Reduce Lymphedema Life Impact Scale to less than 60 percent impairment.   Baseline 87% at eval on 03/16/17   Time 6   Period Weeks   Status New   Target Date 05/21/17            Plan - 03/30/17 1432    Clinical Impression Statement Pt arrived late so was only able to apply compression bandages today which pt reported feeling comfortable at end of session.    Rehab Potential Good   Clinical Impairments Affecting Rehab Potential chronic pain issues   PT Frequency 3x / week   PT Duration 6 weeks   PT Treatment/Interventions ADLs/Self Care Home Management;Manual techniques;Manual lymph drainage;Compression bandaging;Taping;Patient/family education;Passive range of motion;Scar mobilization;Therapeutic exercise   PT Next Visit Plan Check whether patient tried [employer] abdominal binder again. Cont complete decongestive therapy for right UE and abdomen.     Consulted and Agree with Plan of Care Patient      Patient will benefit from skilled therapeutic intervention in order to improve the following deficits and impairments:  Increased edema, Pain, Decreased knowledge of use of DME  Visit  Diagnosis: Postmastectomy lymphedema  Localized edema     Problem List Patient Active Problem List   Diagnosis Date Noted  . Dyspnea 09/23/2016  . Eczema 08/31/2016  . Essential hypertension 11/05/2015  . Cervical dystonia 08/25/2015  . Headache 07/09/2015  . Myofascial muscle pain 04/29/2015  . Adhesive capsulitis of right shoulder 04/29/2015  . Type 2 diabetes mellitus without complication ([place]) 38/03/1750  . Lower  extremity edema 10/01/2014  . Malignant neoplasm of overlapping sites of right breast in female, estrogen receptor positive ([place]) 05/24/2014  . Myalgia and myositis 05/17/2014  . Neoplasm related pain 03/12/2014  . CN (constipation) 11/26/2013  . H/O reduction mammoplasty 10/30/2013  . Seizure ([place]) 10/06/2013  . Status post bilateral breast implants 08/31/2013  . Acquired absence of breast and absent nipple 08/23/2013  . S/P breast reconstruction, right 08/23/2013  . AC joint arthropathy 07/02/2013  . Routine adult health maintenance 05/29/2013  . Hypokalemia 05/21/2013  . Chronic pain 04/19/2013  . Elevated blood sugar 03/09/2013  . Acute bronchitis 02/22/2013  . Lymphedema 05/24/2011  . RHINOSINUSITIS, CHRONIC 04/08/2010  . Obesity 08/04/2006  . DEPRESSIVE DISORDER, NOS 08/04/2006  . Allergic rhinitis 08/04/2006    Otelia Limes, PTA 03/30/2017, 2:40 PM  [place], [place], [address] Phone: [phone]   Fax:  [phone]  Name: Karen Hopkins MRN: [id number] Date of Birth: 02/10/1976

## 2017-04-01 ENCOUNTER — Ambulatory Visit: Payer: Medicare Other | Admitting: Physical Therapy

## 2017-04-01 DIAGNOSIS — I972 Postmastectomy lymphedema syndrome: Secondary | ICD-10-CM | POA: Diagnosis not present

## 2017-04-01 DIAGNOSIS — G8929 Other chronic pain: Secondary | ICD-10-CM | POA: Diagnosis not present

## 2017-04-01 DIAGNOSIS — R600 Localized edema: Secondary | ICD-10-CM | POA: Diagnosis not present

## 2017-04-01 DIAGNOSIS — M25511 Pain in right shoulder: Secondary | ICD-10-CM

## 2017-04-01 NOTE — Therapy (Signed)
[place], [place], [address] Phone: [phone]   Fax:  [phone]  Physical Therapy Treatment  Patient Details  Name: Karen Hopkins MRN: [id number] Date of Birth: Jul 20, 1975 Referring Provider: Dr. Alysia Penna  Encounter Date: 04/01/2017      PT End of Session - 04/01/17 1224    Visit Number 3   Number of Visits 19   Date for PT Re-Evaluation 05/21/17   PT Start Time 1245  pt. late   PT Stop Time 1200   PT Time Calculation (min) 49 min   Activity Tolerance Patient tolerated treatment well   Behavior During Therapy [facility] for tasks assessed/performed      Past Medical History:  Diagnosis Date  . Anxiety   . Cancer ([place])   . Depression   . Essential hypertension 11/05/2015  . History of breast cancer 2011   right  . History of chemotherapy 2011  . History of radiation therapy 2011  . Hypertension    under control with med., has been on med. x 2 yr.  . Lymphedema of arm    right; no BP or puncture to right arm  . Personal history of chemotherapy 09/16/2009   rt breast  . Personal history of radiation therapy 05/2010   rt breast  . Pneumonia   . Seasonal allergies   . Sinus headache     Past Surgical History:  Procedure Laterality Date  . BREAST BIOPSY Right 08/26/2009  . BREAST REDUCTION SURGERY Left 08/23/2013   Procedure: LEFT BREAST REDUCTION  ;  Surgeon: Theodoro Kos, DO;  Location: [place];  Service: Plastics;  Laterality: Left;  . CESAREAN SECTION  07/15/2001; 03/18/2004; 11/21/2008  . LATISSIMUS FLAP TO BREAST Right 03/12/2013   Procedure: RIGHT BREAST LATISSIMUS FLAP WITH EXPANDER PLACEMENT;  Surgeon: Theodoro Kos, DO;  Location: [place];  Service: Plastics;  Laterality: Right;  . LIPOSUCTION Bilateral 08/23/2013   Procedure: LIPOSUCTION;  Surgeon: Theodoro Kos, DO;  Location: [place];  Service: Plastics;  Laterality: Bilateral;  .  MASTECTOMY Right 2011  . MODIFIED RADICAL MASTECTOMY Right 03/11/2010  . NASAL SEPTUM SURGERY    . PORT-A-CATH REMOVAL Left 12/01/2010  . PORTACATH PLACEMENT Left 09/12/2009  . REDUCTION MAMMAPLASTY Left 08/2013  . REMOVAL OF TISSUE EXPANDER AND PLACEMENT OF IMPLANT Right 08/23/2013   Procedure: REMOVAL RIGHT TISSUE EXPANDER AND PLACEMENT OF IMPLANT TO RIGHT BREAST ;  Surgeon: Theodoro Kos, DO;  Location: [place];  Service: Plastics;  Laterality: Right;  . SUPRA-UMBILICAL HERNIA  8099  . TUBAL LIGATION  11/21/2008    There were no vitals filed for this visit.      Subjective Assessment - 04/01/17 1114    Subjective Had to help a friend this morning and it messed my morning up--I didn't get to take a shower or anything.  Waiting for results of CT scan.   Pertinent History Right breast cancer with mastectomy 2011; reconstruction; left breast reduction; h/o right UE lymphedema and right upper quadrant pain, treated in this clinic several times before.   Currently in Pain? Yes   Pain Score 10-Worst pain ever   Pain Location Chest   Pain Orientation Right;Upper;Anterior   Pain Descriptors / Indicators Aching                         [facility] Adult PT Treatment/Exercise - 04/01/17 0001      Manual Therapy  Manual Lymphatic Drainage (MLD) In Supine: Short neck, superficial and deep abdominals, Rt inguinal and Lt axillary nodes, Rt axillo-inguinal and anterior inter-axillary anastomosis, then Rt UE from lateral shoulder to fingers working proximal to distal then retracing all steps.   Compression Bandaging Lotion applied, thick stockinette, Elastomull to fingers 1-4, Artiflex x1, 1-6, 1-10, and 3-12 cm short stretch compression bandages from hand to axilla; upper arm part of last two bandages in herringbone.                   Short Term Clinic Goals - 03/16/17 1701      CC Short Term Goal  #1   Title Decrease circumference measurement at 10 cm  proximal to olecranon on the right to 45 cm or less.   Baseline 45.6 on right compared to 42.8 on left at eval   Time 3   Period Weeks   Status New   Target Date 04/26/17     CC Short Term Goal  #2   Title Decrease circumference measurement of abdomen at 10 cm. superior to upper tip of umbilicus to 341 cm. or less   Baseline 135.1 cm. at eval   Time 3   Period Weeks   Status New   Target Date 04/26/17             Long Term Clinic Goals - 03/16/17 1705      CC Long Term Goal  #1   Title Decrease circumference at 10 cm. superior to right olecranon to 44.5 cm. or less   Baseline 45.6 on right compared to 42.8 on left at eval   Time 6   Period Weeks   Status New   Target Date 05/21/17     CC Long Term Goal  #2   Title decrease circumference of abdomen at 10 cm. superior to upper tip of umbilicus to 962 cm. or less   Baseline 135.1 cm. at eval   Time 6   Period Weeks   Status New   Target Date 05/21/17     CC Long Term Goal  #3   Title Reduce Lymphedema Life Impact Scale to less than 60 percent impairment.   Baseline 87% at eval on 03/16/17   Time 6   Period Weeks   Status New   Target Date 05/21/17            Plan - 04/01/17 1225    Clinical Impression Statement Pt. came in with arm unbandaged.  She had called yesterday reporting the bandages were coming off her hand and sliding down the upper arm, so was advised to remove them then.  Today an additional bandage was used to try to reinforce them.  She saw a video online about a different compression sleeve called [employer]age, and wanted to know what if I knew about those.    Rehab Potential Good   Clinical Impairments Affecting Rehab Potential chronic pain issues   PT Frequency 3x / week   PT Duration 6 weeks   PT Treatment/Interventions ADLs/Self Care Home Management;Manual techniques;Manual lymph drainage;Compression bandaging;Taping;Patient/family education;Passive range of motion;Scar  mobilization;Therapeutic exercise   PT Next Visit Plan Check whether patient tried [employer] abdominal binder again. Cont complete decongestive therapy for right UE and abdomen.     Consulted and Agree with Plan of Care Patient      Patient will benefit from skilled therapeutic intervention in order to improve the following deficits and impairments:  Increased edema, Pain, Decreased knowledge  of use of DME  Visit Diagnosis: Postmastectomy lymphedema  Chronic right shoulder pain     Problem List Patient Active Problem List   Diagnosis Date Noted  . Dyspnea 09/23/2016  . Eczema 08/31/2016  . Essential hypertension 11/05/2015  . Cervical dystonia 08/25/2015  . Headache 07/09/2015  . Myofascial muscle pain 04/29/2015  . Adhesive capsulitis of right shoulder 04/29/2015  . Type 2 diabetes mellitus without complication ([place]) 35/68/6168  . Lower extremity edema 10/01/2014  . Malignant neoplasm of overlapping sites of right breast in female, estrogen receptor positive ([place]) 05/24/2014  . Myalgia and myositis 05/17/2014  . Neoplasm related pain 03/12/2014  . CN (constipation) 11/26/2013  . H/O reduction mammoplasty 10/30/2013  . Seizure ([place]) 10/06/2013  . Status post bilateral breast implants 08/31/2013  . Acquired absence of breast and absent nipple 08/23/2013  . S/P breast reconstruction, right 08/23/2013  . AC joint arthropathy 07/02/2013  . Routine adult health maintenance 05/29/2013  . Hypokalemia 05/21/2013  . Chronic pain 04/19/2013  . Elevated blood sugar 03/09/2013  . Acute bronchitis 02/22/2013  . Lymphedema 05/24/2011  . RHINOSINUSITIS, CHRONIC 04/08/2010  . Obesity 08/04/2006  . DEPRESSIVE DISORDER, NOS 08/04/2006  . Allergic rhinitis 08/04/2006    SALISBURY,DONNA 04/01/2017, 12:28 PM  [place], [place], [address] Phone: [phone]   Fax:  [phone]  Name: Karen Dorow  Hopkins MRN: [id number] Date of Birth: 28-Aug-1975  Serafina Royals, PT 04/01/17 12:28 PM

## 2017-04-04 ENCOUNTER — Ambulatory Visit: Payer: Medicare Other | Admitting: Physical Therapy

## 2017-04-06 ENCOUNTER — Ambulatory Visit: Payer: Medicare Other

## 2017-04-06 DIAGNOSIS — M25511 Pain in right shoulder: Secondary | ICD-10-CM | POA: Diagnosis not present

## 2017-04-06 DIAGNOSIS — R600 Localized edema: Secondary | ICD-10-CM | POA: Diagnosis not present

## 2017-04-06 DIAGNOSIS — G8929 Other chronic pain: Secondary | ICD-10-CM

## 2017-04-06 DIAGNOSIS — I972 Postmastectomy lymphedema syndrome: Secondary | ICD-10-CM | POA: Diagnosis not present

## 2017-04-06 NOTE — Therapy (Signed)
[place], [place], [address] Phone: [phone]   Fax:  [phone]  Physical Therapy Treatment  Patient Details  Name: Karen Hopkins MRN: [id number] Date of Birth: [birth year] Referring Provider: Dr. Alysia Penna  Encounter Date: 04/06/2017      PT End of Session - 04/06/17 1352    Visit Number 4   Number of Visits 19   Date for PT Re-Evaluation 05/21/17   PT Start Time 1311  pt late   PT Stop Time 1350   PT Time Calculation (min) 39 min   Activity Tolerance Patient tolerated treatment well   Behavior During Therapy [facility] for tasks assessed/performed      Past Medical History:  Diagnosis Date  . Anxiety   . Cancer ([place])   . Depression   . Essential hypertension 11/05/2015  . History of breast cancer 2011   right  . History of chemotherapy 2011  . History of radiation therapy 2011  . Hypertension    under control with med., has been on med. x 2 yr.  . Lymphedema of arm    right; no BP or puncture to right arm  . Personal history of chemotherapy 09/16/2009   rt breast  . Personal history of radiation therapy 05/2010   rt breast  . Pneumonia   . Seasonal allergies   . Sinus headache     Past Surgical History:  Procedure Laterality Date  . BREAST BIOPSY Right 08/26/2009  . BREAST REDUCTION SURGERY Left 08/23/2013   Procedure: LEFT BREAST REDUCTION  ;  Surgeon: Theodoro Kos, DO;  Location: [place];  Service: Plastics;  Laterality: Left;  . CESAREAN SECTION  07/15/2001; 03/18/2004; 11/21/2008  . LATISSIMUS FLAP TO BREAST Right 03/12/2013   Procedure: RIGHT BREAST LATISSIMUS FLAP WITH EXPANDER PLACEMENT;  Surgeon: Theodoro Kos, DO;  Location: [place];  Service: Plastics;  Laterality: Right;  . LIPOSUCTION Bilateral 08/23/2013   Procedure: LIPOSUCTION;  Surgeon: Theodoro Kos, DO;  Location: [place];  Service: Plastics;  Laterality: Bilateral;  .  MASTECTOMY Right 2011  . MODIFIED RADICAL MASTECTOMY Right 03/11/2010  . NASAL SEPTUM SURGERY    . PORT-A-CATH REMOVAL Left 12/01/2010  . PORTACATH PLACEMENT Left 09/12/2009  . REDUCTION MAMMAPLASTY Left 08/2013  . REMOVAL OF TISSUE EXPANDER AND PLACEMENT OF IMPLANT Right 08/23/2013   Procedure: REMOVAL RIGHT TISSUE EXPANDER AND PLACEMENT OF IMPLANT TO RIGHT BREAST ;  Surgeon: Theodoro Kos, DO;  Location: [place];  Service: Plastics;  Laterality: Right;  . SUPRA-UMBILICAL HERNIA  6962  . TUBAL LIGATION  11/21/2008    There were no vitals filed for this visit.      Subjective Assessment - 04/06/17 1314    Subjective The CT scan results were good, no cancer. Pain could be from the old scar tissue and trauma.    Pertinent History Right breast cancer with mastectomy 2011; reconstruction; left breast reduction; h/o right UE lymphedema and right upper quadrant pain, treated in this clinic several times before.   Patient Stated Goals I don't want to be here--I was trying not to come--I'm trying to help myself now.   Currently in Pain? Yes   Pain Score 7    Pain Location Chest   Pain Orientation Right;Anterior;Upper   Pain Descriptors / Indicators Aching   Pain Type Chronic pain   Pain Onset 1 to 4 weeks ago   Pain Frequency Constant   Aggravating Factors  Not sure  Pain Relieving Factors botox helps some, meds some                         [facility] Adult PT Treatment/Exercise - 04/06/17 0001      Manual Therapy   Manual Lymphatic Drainage (MLD) In Supine: Short neck, superficial and deep abdominals, Rt inguinal and Lt axillary nodes, Rt axillo-inguinal and anterior inter-axillary anastomosis, then Rt UE from lateral shoulder to fingers working proximal to distal then retracing all steps.   Compression Bandaging Lotion applied, thick stockinette, Elastomull to fingers 1-4, Artiflex x1, 1-6, 1-10, and 3-12 cm short stretch compression bandages from hand to  axilla; upper arm part of last two bandages in herringbone.                   Short Term Clinic Goals - 03/16/17 1701      CC Short Term Goal  #1   Title Decrease circumference measurement at 10 cm proximal to olecranon on the right to 45 cm or less.   Baseline 45.6 on right compared to 42.8 on left at eval   Time 3   Period Weeks   Status New   Target Date 04/26/17     CC Short Term Goal  #2   Title Decrease circumference measurement of abdomen at 10 cm. superior to upper tip of umbilicus to 101 cm. or less   Baseline 135.1 cm. at eval   Time 3   Period Weeks   Status New   Target Date 04/26/17             Long Term Clinic Goals - 03/16/17 1705      CC Long Term Goal  #1   Title Decrease circumference at 10 cm. superior to right olecranon to 44.5 cm. or less   Baseline 45.6 on right compared to 42.8 on left at eval   Time 6   Period Weeks   Status New   Target Date 05/21/17     CC Long Term Goal  #2   Title decrease circumference of abdomen at 10 cm. superior to upper tip of umbilicus to 751 cm. or less   Baseline 135.1 cm. at eval   Time 6   Period Weeks   Status New   Target Date 05/21/17     CC Long Term Goal  #3   Title Reduce Lymphedema Life Impact Scale to less than 60 percent impairment.   Baseline 87% at eval on 03/16/17   Time 6   Period Weeks   Status New   Target Date 05/21/17            Plan - 04/06/17 1353    Clinical Impression Statement Pt came in unbdandaged from weekend. She reports has been feeling very stressed out lately with having alot of appoitnments for herself and then being a single mom and having activities with her 3 boys. Continued with complete decongestive therapy and pt reported bandage feeling good upon leaving. Her CT results were negative for cancer/any recurrence so her Rt chest pain probable to be from scar tissue and overall trauma to that quadrant of her body, per her doctor.    Rehab Potential Good    Clinical Impairments Affecting Rehab Potential chronic pain issues   PT Frequency 3x / week   PT Duration 6 weeks   PT Treatment/Interventions ADLs/Self Care Home Management;Manual techniques;Manual lymph drainage;Compression bandaging;Taping;Patient/family education;Passive range of motion;Scar mobilization;Therapeutic exercise   PT Next  Visit Plan Check whether patient tried [employer] abdominal binder again. Cont complete decongestive therapy for right UE and abdomen.     Consulted and Agree with Plan of Care Patient      Patient will benefit from skilled therapeutic intervention in order to improve the following deficits and impairments:  Increased edema, Pain, Decreased knowledge of use of DME  Visit Diagnosis: Postmastectomy lymphedema  Chronic right shoulder pain     Problem List Patient Active Problem List   Diagnosis Date Noted  . Dyspnea 09/23/2016  . Eczema 08/31/2016  . Essential hypertension 11/05/2015  . Cervical dystonia 08/25/2015  . Headache 07/09/2015  . Myofascial muscle pain 04/29/2015  . Adhesive capsulitis of right shoulder 04/29/2015  . Type 2 diabetes mellitus without complication ([place]) 28/36/6294  . Lower extremity edema 10/01/2014  . Malignant neoplasm of overlapping sites of right breast in female, estrogen receptor positive ([place]) 05/24/2014  . Myalgia and myositis 05/17/2014  . Neoplasm related pain 03/12/2014  . CN (constipation) 11/26/2013  . H/O reduction mammoplasty 10/30/2013  . Seizure ([place]) 10/06/2013  . Status post bilateral breast implants 08/31/2013  . Acquired absence of breast and absent nipple 08/23/2013  . S/P breast reconstruction, right 08/23/2013  . AC joint arthropathy 07/02/2013  . Routine adult health maintenance 05/29/2013  . Hypokalemia 05/21/2013  . Chronic pain 04/19/2013  . Elevated blood sugar 03/09/2013  . Acute bronchitis 02/22/2013  . Lymphedema 05/24/2011  . RHINOSINUSITIS, CHRONIC 04/08/2010  . Obesity  08/04/2006  . DEPRESSIVE DISORDER, NOS 08/04/2006  . Allergic rhinitis 08/04/2006    Otelia Limes, PTA 04/06/2017, 2:04 PM  [place], [place], [address] Phone: [phone]   Fax:  [phone]  Name: Karen Hopkins MRN: [id number] Date of Birth: April 18, 1976

## 2017-04-08 ENCOUNTER — Ambulatory Visit: Payer: Medicare Other | Attending: Physical Medicine & Rehabilitation | Admitting: Physical Therapy

## 2017-04-08 DIAGNOSIS — I972 Postmastectomy lymphedema syndrome: Secondary | ICD-10-CM | POA: Insufficient documentation

## 2017-04-11 ENCOUNTER — Ambulatory Visit: Payer: Medicare Other | Admitting: Physical Therapy

## 2017-04-11 DIAGNOSIS — I972 Postmastectomy lymphedema syndrome: Secondary | ICD-10-CM

## 2017-04-11 NOTE — Therapy (Signed)
[place], [place], [address] Phone: [phone]   Fax:  [phone]  Physical Therapy Treatment  Patient Details  Name: Karen Hopkins MRN: [id number] Date of Birth: 1975/08/24 Referring Provider: Dr. Alysia Penna   Encounter Date: 04/11/2017  PT End of Session - 04/11/17 2027    Visit Number  5    Number of Visits  19    Date for PT Re-Evaluation  05/21/17    PT Start Time  5027 Pt. 13 minutes late   Pt. 13 minutes late   PT Stop Time  1432    PT Time Calculation (min)  34 min    Activity Tolerance  Patient tolerated treatment well    Behavior During Therapy  [facility] for tasks assessed/performed       Past Medical History:  Diagnosis Date  . Anxiety   . Cancer ([place])   . Depression   . Essential hypertension 11/05/2015  . History of breast cancer 2011   right  . History of chemotherapy 2011  . History of radiation therapy 2011  . Hypertension    under control with med., has been on med. x 2 yr.  . Lymphedema of arm    right; no BP or puncture to right arm  . Personal history of chemotherapy 09/16/2009   rt breast  . Personal history of radiation therapy 05/2010   rt breast  . Pneumonia   . Seasonal allergies   . Sinus headache     Past Surgical History:  Procedure Laterality Date  . BREAST BIOPSY Right 08/26/2009  . CESAREAN SECTION  07/15/2001; 03/18/2004; 11/21/2008  . MASTECTOMY Right 2011  . MODIFIED RADICAL MASTECTOMY Right 03/11/2010  . NASAL SEPTUM SURGERY    . PORT-A-CATH REMOVAL Left 12/01/2010  . PORTACATH PLACEMENT Left 09/12/2009  . REDUCTION MAMMAPLASTY Left 08/2013  . SUPRA-UMBILICAL HERNIA  7412  . TUBAL LIGATION  11/21/2008    There were no vitals filed for this visit.  Subjective Assessment - 04/11/17 1401    Subjective  Says she has called and left messages with [place] but can't get through to a live [place]--says she called about missed appointments.  Left her  bandages at home. Took bandages off Thursday (after last session here Wednesday) because it was cutting in around elbow.      Pertinent History  Right breast cancer with mastectomy 2011; reconstruction; left breast reduction; h/o right UE lymphedema and right upper quadrant pain, treated in this clinic several times before.    Currently in Pain?  Yes    Pain Score  8     Pain Location  Chest    Pain Orientation  Right    Pain Descriptors / Indicators  -- aggravated   aggravated   Aggravating Factors   too much running around    Pain Relieving Factors  botox helps some, meds some            LYMPHEDEMA/ONCOLOGY QUESTIONNAIRE - 04/11/17 1405      Right Upper Extremity Lymphedema   15 cm Proximal to Olecranon Process  45 cm    10 cm Proximal to Olecranon Process  44.3 cm    Olecranon Process  29.9 cm    15 cm Proximal to Ulnar Styloid Process  28.4 cm    10 cm Proximal to Ulnar Styloid Process  23.8 cm    Just Proximal to Ulnar Styloid Process  17.7 cm    Across Hand at [employer]  Space  20.2 cm    At [facility] of 2nd Digit  6.2 cm    Other  133.4 cm. around abomen at level of superior umbilicus after exhale   after exhale   Other  129 cm. around abdomen at 10 cm. superior to top of umbilicus after exhale   after exhale              [facility] Adult PT Treatment/Exercise - 04/11/17 0001      Manual Therapy   Edema Management  circumference measurements taken of right arm and abdomen    Compression Bandaging  Lotion applied, thin stockinette, Elastomull to all fingers, Artiflex x1, 1-6, 1-10, and 2-12 cm short stretch compression bandages from hand to axilla (both 12-cm in herring bone fashion above elbow) New bandages issued; patient forgot hers   New bandages issued; patient forgot hers               Short Term Clinic Goals - 03/16/17 1701      CC Short Term Goal  #1   Title  Decrease circumference measurement at 10 cm proximal to olecranon on the right to 45 cm or  less.    Baseline  45.6 on right compared to 42.8 on left at eval    Time  3    Period  Weeks    Status  New    Target Date  04/26/17      CC Short Term Goal  #2   Title  Decrease circumference measurement of abdomen at 10 cm. superior to upper tip of umbilicus to 161 cm. or less    Baseline  135.1 cm. at eval    Time  3    Period  Weeks    Status  New    Target Date  04/26/17          Long Term Clinic Goals - 03/16/17 1705      CC Long Term Goal  #1   Title  Decrease circumference at 10 cm. superior to right olecranon to 44.5 cm. or less    Baseline  45.6 on right compared to 42.8 on left at eval    Time  6    Period  Weeks    Status  New    Target Date  05/21/17      CC Long Term Goal  #2   Title  decrease circumference of abdomen at 10 cm. superior to upper tip of umbilicus to 096 cm. or less    Baseline  135.1 cm. at eval    Time  6    Period  Weeks    Status  New    Target Date  05/21/17      CC Long Term Goal  #3   Title  Reduce Lymphedema Life Impact Scale to less than 60 percent impairment.    Baseline  87% at eval on 03/16/17    Time  6    Period  Weeks    Status  New    Target Date  05/21/17         Plan - 04/11/17 2028    Clinical Impression Statement  Pt. came in unbandaged as she has not been here in several days.  She reports that she called and left messages about recent missed appointments.  Her UE measurements were reduced at almost all levels today, despite not having been in bandages in four days.  Her abdominal measurements were greater than when last measured but less  than on initial evaluation.      Rehab Potential  Good    Clinical Impairments Affecting Rehab Potential  chronic pain issues    PT Frequency  3x / week    PT Duration  6 weeks    PT Treatment/Interventions  ADLs/Self Care Home Management;Manual techniques;Manual lymph drainage;Compression bandaging;Taping;Patient/family education;Passive range of motion;Scar  mobilization;Therapeutic exercise    PT Next Visit Plan  Check whether patient tried [employer] abdominal binder again. Cont complete decongestive therapy for right UE and abdomen.      Recommended Other Services  Pt. and therapist agreed that patient would call [name] about fitting for new garments.    Consulted and Agree with Plan of Care  Patient       Patient will benefit from skilled therapeutic intervention in order to improve the following deficits and impairments:  Increased edema, Pain, Decreased knowledge of use of DME  Visit Diagnosis: Postmastectomy lymphedema     Problem List Patient Active Problem List   Diagnosis Date Noted  . Dyspnea 09/23/2016  . Eczema 08/31/2016  . Essential hypertension 11/05/2015  . Cervical dystonia 08/25/2015  . Headache 07/09/2015  . Myofascial muscle pain 04/29/2015  . Adhesive capsulitis of right shoulder 04/29/2015  . Type 2 diabetes mellitus without complication ([place]) 93/71/6967  . Lower extremity edema 10/01/2014  . Malignant neoplasm of overlapping sites of right breast in female, estrogen receptor positive ([place]) 05/24/2014  . Myalgia and myositis 05/17/2014  . Neoplasm related pain 03/12/2014  . CN (constipation) 11/26/2013  . H/O reduction mammoplasty 10/30/2013  . Seizure ([place]) 10/06/2013  . Status post bilateral breast implants 08/31/2013  . Acquired absence of breast and absent nipple 08/23/2013  . S/P breast reconstruction, right 08/23/2013  . AC joint arthropathy 07/02/2013  . Routine adult health maintenance 05/29/2013  . Hypokalemia 05/21/2013  . Chronic pain 04/19/2013  . Elevated blood sugar 03/09/2013  . Acute bronchitis 02/22/2013  . Lymphedema 05/24/2011  . RHINOSINUSITIS, CHRONIC 04/08/2010  . Obesity 08/04/2006  . DEPRESSIVE DISORDER, NOS 08/04/2006  . Allergic rhinitis 08/04/2006    Alfonse Garringer 04/11/2017, 8:32 PM  [place], [place], [address] Phone: [phone]   Fax:  [phone]  Name: Karen Hopkins MRN: [id number] Date of Birth: 1976-02-14  Serafina Royals, PT 04/11/17 8:32 PM

## 2017-04-12 ENCOUNTER — Encounter: Payer: Medicare Other | Attending: Registered Nurse

## 2017-04-12 ENCOUNTER — Ambulatory Visit (HOSPITAL_BASED_OUTPATIENT_CLINIC_OR_DEPARTMENT_OTHER): Payer: Medicare Other | Admitting: Physical Medicine & Rehabilitation

## 2017-04-12 ENCOUNTER — Encounter: Payer: Self-pay | Admitting: Physical Medicine & Rehabilitation

## 2017-04-12 VITALS — BP 116/83 | HR 104 | Resp 14

## 2017-04-12 DIAGNOSIS — M546 Pain in thoracic spine: Secondary | ICD-10-CM | POA: Diagnosis not present

## 2017-04-12 DIAGNOSIS — I890 Lymphedema, not elsewhere classified: Secondary | ICD-10-CM | POA: Insufficient documentation

## 2017-04-12 DIAGNOSIS — G243 Spasmodic torticollis: Secondary | ICD-10-CM | POA: Diagnosis not present

## 2017-04-12 MED ORDER — OXYCODONE HCL 5 MG PO TABS: 5.0000 mg | ORAL_TABLET | Freq: Four times a day (QID) | ORAL | 0 refills | Status: DC | PRN

## 2017-04-12 NOTE — Progress Notes (Signed)
Botulinum toxin injection for cervical dystonia CPT code [phone] Diagnosis code G 24.3 Indication is cervical dystonia that has not responded to conservative care and interferes with activities of daily living as well as cervical range of motion. Chronic cervical pain not relieved by other treatments.  Informed consent was obtained after describing risks and benefits of the procedure with the patient this included bleeding bruising and infection The patient elects to proceed and has given Written consent.  REMS form completed Right Trapezius, 50 Right levator, 75 units Right sternocleidomastoid, 25units Right scalene 25 units ant 25 U middle Patient placed in a seated position A 27-gauge 1 inch needle electrode was used to guide the injection under EMG guidance.   All injections done after negative drawback for blood. Patient tolerated procedure well. Post procedure instructions given. Follow up appointment made

## 2017-04-12 NOTE — Patient Instructions (Signed)
Botox 200 U for cervical dystonia

## 2017-04-13 ENCOUNTER — Ambulatory Visit: Payer: Medicare Other | Admitting: Physical Therapy

## 2017-04-13 DIAGNOSIS — I972 Postmastectomy lymphedema syndrome: Secondary | ICD-10-CM | POA: Diagnosis not present

## 2017-04-13 NOTE — Therapy (Addendum)
[place], [place], [address] Phone: [phone]   Fax:  [phone]  Physical Therapy Treatment  Patient Details  Name: Karen Hopkins MRN: [id number] Date of Birth: Jan 18, 1976 Referring Provider: Dr. Alysia Penna   Encounter Date: 04/13/2017  PT End of Session - 04/13/17 1353    Visit Number  6    Number of Visits  19    Date for PT Re-Evaluation  05/21/17    PT Start Time  6962 pt. late for appt.   pt. late for appt.   PT Stop Time  1346    PT Time Calculation (min)  36 min    Activity Tolerance  Patient tolerated treatment well    Behavior During Therapy  [facility] for tasks assessed/performed       Past Medical History:  Diagnosis Date  . Anxiety   . Cancer ([place])   . Depression   . Essential hypertension 11/05/2015  . History of breast cancer 2011   right  . History of chemotherapy 2011  . History of radiation therapy 2011  . Hypertension    under control with med., has been on med. x 2 yr.  . Lymphedema of arm    right; no BP or puncture to right arm  . Personal history of chemotherapy 09/16/2009   rt breast  . Personal history of radiation therapy 05/2010   rt breast  . Pneumonia   . Seasonal allergies   . Sinus headache     Past Surgical History:  Procedure Laterality Date  . BREAST BIOPSY Right 08/26/2009  . CESAREAN SECTION  07/15/2001; 03/18/2004; 11/21/2008  . MASTECTOMY Right 2011  . MODIFIED RADICAL MASTECTOMY Right 03/11/2010  . NASAL SEPTUM SURGERY    . PORT-A-CATH REMOVAL Left 12/01/2010  . PORTACATH PLACEMENT Left 09/12/2009  . REDUCTION MAMMAPLASTY Left 08/2013  . SUPRA-UMBILICAL HERNIA  9528  . TUBAL LIGATION  11/21/2008    There were no vitals filed for this visit.  Subjective Assessment - 04/13/17 1311    Subjective  I called Cathy--did she email you?  I can't find my other bandages--my boys did something with them for real. Took bandages off last night.  I  spilled ice cream.    Pertinent History  Right breast cancer with mastectomy 2011; reconstruction; left breast reduction; h/o right UE lymphedema and right upper quadrant pain, treated in this clinic several times before.    Currently in Pain?  Yes    Pain Score  7     Pain Location  Chest    Pain Orientation  Right    Pain Relieving Factors  injections that she had yesterday                      [facility] Adult PT Treatment/Exercise - 04/13/17 0001      Manual Therapy   Manual Lymphatic Drainage (MLD)  In Supine: Short neck, superficial and deep abdominals, Rt inguinal and Lt axillary nodes, Rt axillo-inguinal and anterior inter-axillary anastomosis, then Rt UE from lateral shoulder to dorsal hand working proximal to distal then retracing all steps.    Compression Bandaging  Lotion applied, thin stockinette, Elastomull to all fingers, Artiflex x1, 1-6, 1-10, and 2-12 cm short stretch compression bandages from hand to axilla (both 12-cm in herring bone fashion above elbow) New bandages issued; patient forgot hers   New bandages issued; patient forgot hers  Short Term Clinic Goals - 04/13/17 1356      CC Short Term Goal  #1   Title  Decrease circumference measurement at 10 cm proximal to olecranon on the right to 45 cm or less.    Baseline  45.6 on right compared to 42.8 on left at eval; 44.3 on 04/11/17    Status  Achieved      CC Short Term Goal  #2   Title  Decrease circumference measurement of abdomen at 10 cm. superior to upper tip of umbilicus to 592 cm. or less    Baseline  135.1 cm. at eval; 129 on 04/11/17    Status  Achieved          Long Term Clinic Goals - 04/13/17 1358      CC Long Term Goal  #1   Title  Decrease circumference at 10 cm. superior to right olecranon to 44.5 cm. or less    Baseline  45.6 on right compared to 42.8 on left at eval; 44.3 on 04/11/17    Status  Achieved      CC Long Term Goal  #2   Title  decrease  circumference of abdomen at 10 cm. superior to upper tip of umbilicus to 924 cm. or less    Baseline  135.1 cm. at eval; 129 on 04/11/17    Status  Achieved      CC Long Term Goal  #3   Title  Reduce Lymphedema Life Impact Scale to less than 60 percent impairment.    Status  On-going         Plan - 04/13/17 1354    Clinical Impression Statement  Pt.'s measurements taken last time actually met goals written for her circumference reductions.  She called the fitter, [name], and we have sent information to [place] about measuring her for new daytime garments.     Rehab Potential  Good    Clinical Impairments Affecting Rehab Potential  chronic pain issues    PT Frequency  3x / week    PT Duration  6 weeks    PT Treatment/Interventions  ADLs/Self Care Home Management;Manual techniques;Manual lymph drainage;Compression bandaging;Taping;Patient/family education;Passive range of motion;Scar mobilization;Therapeutic exercise    PT Next Visit Plan  Check whether patient tried [employer] abdominal binder again. Cont complete decongestive therapy for right UE and abdomen.      Consulted and Agree with Plan of Care  Patient       Patient will benefit from skilled therapeutic intervention in order to improve the following deficits and impairments:  Increased edema, Pain, Decreased knowledge of use of DME  Visit Diagnosis: Postmastectomy lymphedema     Problem List Patient Active Problem List   Diagnosis Date Noted  . Dyspnea 09/23/2016  . Eczema 08/31/2016  . Essential hypertension 11/05/2015  . Cervical dystonia 08/25/2015  . Headache 07/09/2015  . Myofascial muscle pain 04/29/2015  . Adhesive capsulitis of right shoulder 04/29/2015  . Type 2 diabetes mellitus without complication ([place]) 46/28/6381  . Lower extremity edema 10/01/2014  . Malignant neoplasm of overlapping sites of right breast in female, estrogen receptor positive ([place]) 05/24/2014  . Myalgia and myositis 05/17/2014  .  Neoplasm related pain 03/12/2014  . CN (constipation) 11/26/2013  . H/O reduction mammoplasty 10/30/2013  . Seizure ([place]) 10/06/2013  . Status post bilateral breast implants 08/31/2013  . Acquired absence of breast and absent nipple 08/23/2013  . S/P breast reconstruction, right 08/23/2013  . AC joint arthropathy 07/02/2013  .  Routine adult health maintenance 05/29/2013  . Hypokalemia 05/21/2013  . Chronic pain 04/19/2013  . Elevated blood sugar 03/09/2013  . Acute bronchitis 02/22/2013  . Lymphedema 05/24/2011  . RHINOSINUSITIS, CHRONIC 04/08/2010  . Obesity 08/04/2006  . DEPRESSIVE DISORDER, NOS 08/04/2006  . Allergic rhinitis 08/04/2006    SALISBURY,DONNA 04/13/2017, 2:28 PM  [place], [place], [address] Phone: [phone]   Fax:  [phone]  Name: Tomiko Schoon Whidden MRN: [id number] Date of Birth: 27-Mar-1976  Serafina Royals, PT 04/13/17 2:28 PM

## 2017-04-15 ENCOUNTER — Other Ambulatory Visit: Payer: Self-pay | Admitting: Oncology

## 2017-04-15 ENCOUNTER — Ambulatory Visit: Payer: Medicare Other | Admitting: Physical Therapy

## 2017-04-15 ENCOUNTER — Other Ambulatory Visit: Payer: Self-pay | Admitting: Cardiovascular Disease

## 2017-04-15 NOTE — Telephone Encounter (Signed)
Please review for refill. Thanks!  

## 2017-04-18 ENCOUNTER — Encounter: Payer: Self-pay | Admitting: Physical Therapy

## 2017-04-18 ENCOUNTER — Ambulatory Visit: Payer: Medicare Other | Admitting: Physical Therapy

## 2017-04-18 DIAGNOSIS — I972 Postmastectomy lymphedema syndrome: Secondary | ICD-10-CM

## 2017-04-18 NOTE — Therapy (Signed)
[place], [place], [address] Phone: [phone]   Fax:  [phone]  Physical Therapy Treatment  Patient Details  Name: Karen Hopkins MRN: [id number] Date of Birth: 1975/07/16 Referring Provider: Dr. Alysia Penna   Encounter Date: 04/18/2017  PT End of Session - 04/18/17 1350    Visit Number  7    Number of Visits  19    Date for PT Re-Evaluation  05/21/17    PT Start Time  1311 pt arrived late    PT Stop Time  1345    PT Time Calculation (min)  34 min    Activity Tolerance  Patient tolerated treatment well    Behavior During Therapy  [facility] for tasks assessed/performed       Past Medical History:  Diagnosis Date  . Anxiety   . Cancer ([place])   . Depression   . Essential hypertension 11/05/2015  . History of breast cancer 2011   right  . History of chemotherapy 2011  . History of radiation therapy 2011  . Hypertension    under control with med., has been on med. x 2 yr.  . Lymphedema of arm    right; no BP or puncture to right arm  . Personal history of chemotherapy 09/16/2009   rt breast  . Personal history of radiation therapy 05/2010   rt breast  . Pneumonia   . Seasonal allergies   . Sinus headache     Past Surgical History:  Procedure Laterality Date  . BREAST BIOPSY Right 08/26/2009  . CESAREAN SECTION  07/15/2001; 03/18/2004; 11/21/2008  . MASTECTOMY Right 2011  . MODIFIED RADICAL MASTECTOMY Right 03/11/2010  . NASAL SEPTUM SURGERY    . PORT-A-CATH REMOVAL Left 12/01/2010  . PORTACATH PLACEMENT Left 09/12/2009  . REDUCTION MAMMAPLASTY Left 08/2013  . SUPRA-UMBILICAL HERNIA  9833  . TUBAL LIGATION  11/21/2008    There were no vitals filed for this visit.  Subjective Assessment - 04/18/17 1312    Subjective  I had more wraps but I can't find them. I think my kids threw them away.     Pertinent History  Right breast cancer with mastectomy 2011; reconstruction; left breast  reduction; h/o right UE lymphedema and right upper quadrant pain, treated in this clinic several times before.    Patient Stated Goals  I don't want to be here--I was trying not to come--I'm trying to help myself now.    Currently in Pain?  Yes    Pain Score  6     Pain Location  Chest    Pain Descriptors / Indicators  Throbbing stiff                      [facility] Adult PT Treatment/Exercise - 04/18/17 0001      Manual Therapy   Manual Lymphatic Drainage (MLD)  In Supine: Short neck, superficial and deep abdominals, Rt inguinal and Lt axillary nodes, Rt axillo-inguinal and anterior inter-axillary anastomosis, then Rt UE from lateral shoulder to dorsal hand working proximal to distal then retracing all steps.    Compression Bandaging  Lotion applied, thin stockinette, Elastomull to all fingers, Artiflex x1, 1-6, 1-10, and 2-12 cm short stretch compression bandages from hand to axilla (both 12-cm in herring bone fashion)  New bandages issued; patient forgot hers                Short Term Clinic Goals - 04/13/17 1356  CC Short Term Goal  #1   Title  Decrease circumference measurement at 10 cm proximal to olecranon on the right to 45 cm or less.    Baseline  45.6 on right compared to 42.8 on left at eval; 44.3 on 04/11/17    Status  Achieved      CC Short Term Goal  #2   Title  Decrease circumference measurement of abdomen at 10 cm. superior to upper tip of umbilicus to 419 cm. or less    Baseline  135.1 cm. at eval; 129 on 04/11/17    Status  Achieved          Long Term Clinic Goals - 04/13/17 1358      CC Long Term Goal  #1   Title  Decrease circumference at 10 cm. superior to right olecranon to 44.5 cm. or less    Baseline  45.6 on right compared to 42.8 on left at eval; 44.3 on 04/11/17    Status  Achieved      CC Long Term Goal  #2   Title  decrease circumference of abdomen at 10 cm. superior to upper tip of umbilicus to 622 cm. or less    Baseline   135.1 cm. at eval; 129 on 04/11/17    Status  Achieved      CC Long Term Goal  #3   Title  Reduce Lymphedema Life Impact Scale to less than 60 percent impairment.    Status  On-going         Plan - 04/18/17 1350    Clinical Impression Statement  Continued with MLD and bandaging while pt is awaiting arrival of her new daytime garments.     Rehab Potential  Good    Clinical Impairments Affecting Rehab Potential  chronic pain issues    PT Frequency  3x / week    PT Duration  6 weeks    PT Treatment/Interventions  ADLs/Self Care Home Management;Manual techniques;Manual lymph drainage;Compression bandaging;Taping;Patient/family education;Passive range of motion;Scar mobilization;Therapeutic exercise    PT Next Visit Plan  Check whether patient tried [employer] abdominal binder again. Cont complete decongestive therapy for right UE and abdomen.      Consulted and Agree with Plan of Care  Patient       Patient will benefit from skilled therapeutic intervention in order to improve the following deficits and impairments:  Increased edema, Pain, Decreased knowledge of use of DME  Visit Diagnosis: Postmastectomy lymphedema     Problem List Patient Active Problem List   Diagnosis Date Noted  . Dyspnea 09/23/2016  . Eczema 08/31/2016  . Essential hypertension 11/05/2015  . Cervical dystonia 08/25/2015  . Headache 07/09/2015  . Myofascial muscle pain 04/29/2015  . Adhesive capsulitis of right shoulder 04/29/2015  . Type 2 diabetes mellitus without complication ([place]) 29/79/8921  . Lower extremity edema 10/01/2014  . Malignant neoplasm of overlapping sites of right breast in female, estrogen receptor positive ([place]) 05/24/2014  . Myalgia and myositis 05/17/2014  . Neoplasm related pain 03/12/2014  . CN (constipation) 11/26/2013  . H/O reduction mammoplasty 10/30/2013  . Seizure ([place]) 10/06/2013  . Status post bilateral breast implants 08/31/2013  . Acquired absence of breast and  absent nipple 08/23/2013  . S/P breast reconstruction, right 08/23/2013  . AC joint arthropathy 07/02/2013  . Routine adult health maintenance 05/29/2013  . Hypokalemia 05/21/2013  . Chronic pain 04/19/2013  . Elevated blood sugar 03/09/2013  . Acute bronchitis 02/22/2013  . Lymphedema 05/24/2011  .  RHINOSINUSITIS, CHRONIC 04/08/2010  . Obesity 08/04/2006  . DEPRESSIVE DISORDER, NOS 08/04/2006  . Allergic rhinitis 08/04/2006    Allyson Sabal [facility] 04/18/2017, 1:51 PM  [place], [place], [address] Phone: [phone]   Fax:  [phone]  Name: Twanisha Foulk Manges MRN: [id number] Date of Birth: September 05, 1975  Manus Gunning, PT 04/18/17 1:52 PM

## 2017-04-22 ENCOUNTER — Ambulatory Visit: Payer: Medicare Other | Admitting: Physical Therapy

## 2017-04-25 ENCOUNTER — Ambulatory Visit: Payer: Medicare Other

## 2017-04-25 ENCOUNTER — Encounter: Payer: Self-pay | Admitting: Physical Therapy

## 2017-04-25 ENCOUNTER — Ambulatory Visit: Payer: Medicare Other | Admitting: Physical Therapy

## 2017-04-25 DIAGNOSIS — I972 Postmastectomy lymphedema syndrome: Secondary | ICD-10-CM

## 2017-04-25 NOTE — Therapy (Signed)
[place], [place], [address] Phone: [phone]   Fax:  [phone]  Physical Therapy Treatment  Patient Details  Name: Karen Hopkins MRN: [id number] Date of Birth: [birth year] Referring Provider: Dr. Alysia Penna   Encounter Date: 04/25/2017  PT End of Session - 04/25/17 1140    Visit Number  8    Number of Visits  19    Date for PT Re-Evaluation  05/21/17    PT Start Time  1120 pt arrived late    PT Stop Time  1140    PT Time Calculation (min)  20 min    Activity Tolerance  Patient tolerated treatment well    Behavior During Therapy  [facility] for tasks assessed/performed       Past Medical History:  Diagnosis Date  . Anxiety   . Cancer ([place])   . Depression   . Essential hypertension 11/05/2015  . History of breast cancer 2011   right  . History of chemotherapy 2011  . History of radiation therapy 2011  . Hypertension    under control with med., has been on med. x 2 yr.  . Lymphedema of arm    right; no BP or puncture to right arm  . Personal history of chemotherapy 09/16/2009   rt breast  . Personal history of radiation therapy 05/2010   rt breast  . Pneumonia   . Seasonal allergies   . Sinus headache     Past Surgical History:  Procedure Laterality Date  . BREAST BIOPSY Right 08/26/2009  . CESAREAN SECTION  07/15/2001; 03/18/2004; 11/21/2008  . LEFT BREAST REDUCTION Left 08/23/2013   Performed by Theodoro Kos, DO at [facility]  . LIPOSUCTION Bilateral 08/23/2013   Performed by Theodoro Kos, DO at [facility]  . MASTECTOMY Right 2011  . MODIFIED RADICAL MASTECTOMY Right 03/11/2010  . NASAL SEPTUM SURGERY    . PORT-A-CATH REMOVAL Left 12/01/2010  . PORTACATH PLACEMENT Left 09/12/2009  . REDUCTION MAMMAPLASTY Left 08/2013  . REMOVAL RIGHT TISSUE EXPANDER AND PLACEMENT OF IMPLANT TO RIGHT BREAST Right 08/23/2013   Performed by Theodoro Kos, DO at [facility]  . RIGHT BREAST LATISSIMUS FLAP WITH EXPANDER PLACEMENT Right 03/12/2013   Performed by Theodoro Kos, DO at [facility]  . SUPRA-UMBILICAL HERNIA  3267  . TUBAL LIGATION  11/21/2008    There were no vitals filed for this visit.  Subjective Assessment - 04/25/17 1120    Subjective  I am ready to get measured.     Pertinent History  Right breast cancer with mastectomy 2011; reconstruction; left breast reduction; h/o right UE lymphedema and right upper quadrant pain, treated in this clinic several times before.    Patient Stated Goals  I don't want to be here--I was trying not to come--I'm trying to help myself now.    Currently in Pain?  Yes    Pain Score  8     Pain Location  Chest    Pain Orientation  Right    Pain Descriptors / Indicators  Throbbing                      [facility] Adult PT Treatment/Exercise - 04/25/17 0001      Manual Therapy   Compression Bandaging  Lotion applied, thin stockinette, Elastomull to all fingers, Artiflex x1, 1-6, 1-10, and 2-12 cm short stretch compression bandages from hand to axilla (both 12-cm in  herring bone fashion)  New bandages issued; patient forgot hers                Short Term Clinic Goals - 04/13/17 1356      CC Short Term Goal  #1   Title  Decrease circumference measurement at 10 cm proximal to olecranon on the right to 45 cm or less.    Baseline  45.6 on right compared to 42.8 on left at eval; 44.3 on 04/11/17    Status  Achieved      CC Short Term Goal  #2   Title  Decrease circumference measurement of abdomen at 10 cm. superior to upper tip of umbilicus to 196 cm. or less    Baseline  135.1 cm. at eval; 129 on 04/11/17    Status  Achieved          Long Term Clinic Goals - 04/13/17 1358      CC Long Term Goal  #1   Title  Decrease circumference at 10 cm. superior to right olecranon to 44.5 cm. or less    Baseline  45.6 on right compared to 42.8 on left at eval; 44.3 on 04/11/17    Status   Achieved      CC Long Term Goal  #2   Title  decrease circumference of abdomen at 10 cm. superior to upper tip of umbilicus to 222 cm. or less    Baseline  135.1 cm. at eval; 129 on 04/11/17    Status  Achieved      CC Long Term Goal  #3   Title  Reduce Lymphedema Life Impact Scale to less than 60 percent impairment.    Status  On-going         Plan - 04/25/17 1138    Clinical Impression Statement  Pt arrived late today so there was not time to perform MLD. Continued with compression bandaging to RUE. Pt has been wearing [employer] abdominal binder.     Rehab Potential  Good    Clinical Impairments Affecting Rehab Potential  chronic pain issues    PT Frequency  3x / week    PT Duration  6 weeks    PT Treatment/Interventions  ADLs/Self Care Home Management;Manual techniques;Manual lymph drainage;Compression bandaging;Taping;Patient/family education;Passive range of motion;Scar mobilization;Therapeutic exercise    PT Next Visit Plan   Cont complete decongestive therapy for right UE and abdomen.      Consulted and Agree with Plan of Care  Patient       Patient will benefit from skilled therapeutic intervention in order to improve the following deficits and impairments:  Increased edema, Pain, Decreased knowledge of use of DME  Visit Diagnosis: Postmastectomy lymphedema     Problem List Patient Active Problem List   Diagnosis Date Noted  . Dyspnea 09/23/2016  . Eczema 08/31/2016  . Essential hypertension 11/05/2015  . Cervical dystonia 08/25/2015  . Headache 07/09/2015  . Myofascial muscle pain 04/29/2015  . Adhesive capsulitis of right shoulder 04/29/2015  . Type 2 diabetes mellitus without complication ([place]) 97/98/9211  . Lower extremity edema 10/01/2014  . Malignant neoplasm of overlapping sites of right breast in female, estrogen receptor positive ([place]) 05/24/2014  . Myalgia and myositis 05/17/2014  . Neoplasm related pain 03/12/2014  . CN (constipation) 11/26/2013  .  H/O reduction mammoplasty 10/30/2013  . Seizure ([place]) 10/06/2013  . Status post bilateral breast implants 08/31/2013  . Acquired absence of breast and absent nipple 08/23/2013  . S/P breast reconstruction,  right 08/23/2013  . AC joint arthropathy 07/02/2013  . Routine adult health maintenance 05/29/2013  . Hypokalemia 05/21/2013  . Chronic pain 04/19/2013  . Elevated blood sugar 03/09/2013  . Acute bronchitis 02/22/2013  . Lymphedema 05/24/2011  . RHINOSINUSITIS, CHRONIC 04/08/2010  . Obesity 08/04/2006  . DEPRESSIVE DISORDER, NOS 08/04/2006  . Allergic rhinitis 08/04/2006    Allyson Sabal [facility] 04/25/2017, 11:41 AM  [place], [place], [address] Phone: [phone]   Fax:  [phone]  Name: Vila Dory Ayer MRN: [id number] Date of Birth: Mar 17, 1976  Manus Gunning, PT 04/25/17 11:52 AM

## 2017-04-26 ENCOUNTER — Telehealth: Payer: Self-pay | Admitting: Adult Health

## 2017-04-26 NOTE — Telephone Encounter (Signed)
Scheduled appts per standing orders. Patient was not scheduled for her monthly injections.

## 2017-04-27 ENCOUNTER — Other Ambulatory Visit (HOSPITAL_BASED_OUTPATIENT_CLINIC_OR_DEPARTMENT_OTHER): Payer: Medicare Other

## 2017-04-27 ENCOUNTER — Ambulatory Visit (HOSPITAL_BASED_OUTPATIENT_CLINIC_OR_DEPARTMENT_OTHER): Payer: Medicare Other

## 2017-04-27 VITALS — BP 136/82 | HR 94 | Temp 98.0°F | Resp 16

## 2017-04-27 DIAGNOSIS — Z170 Estrogen receptor positive status [ER+]: Principal | ICD-10-CM

## 2017-04-27 DIAGNOSIS — Z5111 Encounter for antineoplastic chemotherapy: Secondary | ICD-10-CM | POA: Diagnosis not present

## 2017-04-27 DIAGNOSIS — C50811 Malignant neoplasm of overlapping sites of right female breast: Secondary | ICD-10-CM

## 2017-04-27 DIAGNOSIS — C50011 Malignant neoplasm of nipple and areola, right female breast: Secondary | ICD-10-CM

## 2017-04-27 LAB — CBC WITH DIFFERENTIAL/PLATELET
BASO%: 0.2 % (ref 0.0–2.0)
BASOS ABS: 0 10*3/uL (ref 0.0–0.1)
EOS%: 7.2 % — AB (ref 0.0–7.0)
Eosinophils Absolute: 0.7 10*3/uL — ABNORMAL HIGH (ref 0.0–0.5)
HCT: 39 % (ref 34.8–46.6)
HGB: 12.5 g/dL (ref 11.6–15.9)
LYMPH%: 35.1 % (ref 14.0–49.7)
MCH: 27.8 pg (ref 25.1–34.0)
MCHC: 32.1 g/dL (ref 31.5–36.0)
MCV: 86.7 fL (ref 79.5–101.0)
MONO#: 0.4 10*3/uL (ref 0.1–0.9)
MONO%: 3.8 % (ref 0.0–14.0)
NEUT#: 5.3 10*3/uL (ref 1.5–6.5)
NEUT%: 53.7 % (ref 38.4–76.8)
Platelets: 285 10*3/uL (ref 145–400)
RBC: 4.5 10*6/uL (ref 3.70–5.45)
RDW: 14.4 % (ref 11.2–14.5)
WBC: 9.9 10*3/uL (ref 3.9–10.3)
lymph#: 3.5 10*3/uL — ABNORMAL HIGH (ref 0.9–3.3)

## 2017-04-27 LAB — COMPREHENSIVE METABOLIC PANEL
ALBUMIN: 3.6 g/dL (ref 3.5–5.0)
ALK PHOS: 100 U/L (ref 40–150)
ALT: 14 U/L (ref 0–55)
AST: 11 U/L (ref 5–34)
Anion Gap: 8 mEq/L (ref 3–11)
BUN: 11 mg/dL (ref 7.0–26.0)
CO2: 22 mEq/L (ref 22–29)
CREATININE: 0.8 mg/dL (ref 0.6–1.1)
Calcium: 9.3 mg/dL (ref 8.4–10.4)
Chloride: 112 mEq/L — ABNORMAL HIGH (ref 98–109)
EGFR: >60 mL/min/{1.73_m2} (ref >60)
GLUCOSE: 123 mg/dL (ref 70–140)
Potassium: 3.8 mEq/L (ref 3.5–5.1)
SODIUM: 142 meq/L (ref 136–145)
TOTAL PROTEIN: 7.5 g/dL (ref 6.4–8.3)

## 2017-04-27 MED ORDER — GOSERELIN ACETATE 3.6 MG ~~LOC~~ IMPL
3.6000 mg | DRUG_IMPLANT | Freq: Once | SUBCUTANEOUS | Status: AC
  Administered 2017-04-27: 3.6 mg via SUBCUTANEOUS
  Filled 2017-04-27: qty 3.6

## 2017-05-03 ENCOUNTER — Ambulatory Visit: Payer: Medicare Other

## 2017-05-03 DIAGNOSIS — I972 Postmastectomy lymphedema syndrome: Secondary | ICD-10-CM

## 2017-05-03 NOTE — Therapy (Signed)
[place], [place], [address] Phone: [phone]   Fax:  [phone]  Physical Therapy Treatment  Patient Details  Name: Karen Hopkins MRN: [id number] Date of Birth: 07-29-1975 Referring Provider: Dr. Alysia Penna   Encounter Date: 05/03/2017  PT End of Session - 05/03/17 1110    Visit Number  9    Number of Visits  19    Date for PT Re-Evaluation  05/21/17    PT Start Time  1030 pt arrived late    PT Stop Time  1110    PT Time Calculation (min)  40 min    Activity Tolerance  Patient tolerated treatment well    Behavior During Therapy  [facility] for tasks assessed/performed       Past Medical History:  Diagnosis Date  . Anxiety   . Cancer ([place])   . Depression   . Essential hypertension 11/05/2015  . History of breast cancer 2011   right  . History of chemotherapy 2011  . History of radiation therapy 2011  . Hypertension    under control with med., has been on med. x 2 yr.  . Lymphedema of arm    right; no BP or puncture to right arm  . Personal history of chemotherapy 09/16/2009   rt breast  . Personal history of radiation therapy 05/2010   rt breast  . Pneumonia   . Seasonal allergies   . Sinus headache     Past Surgical History:  Procedure Laterality Date  . BREAST BIOPSY Right 08/26/2009  . BREAST REDUCTION SURGERY Left 08/23/2013   Procedure: LEFT BREAST REDUCTION  ;  Surgeon: Theodoro Kos, DO;  Location: [place];  Service: Plastics;  Laterality: Left;  . CESAREAN SECTION  07/15/2001; 03/18/2004; 11/21/2008  . LATISSIMUS FLAP TO BREAST Right 03/12/2013   Procedure: RIGHT BREAST LATISSIMUS FLAP WITH EXPANDER PLACEMENT;  Surgeon: Theodoro Kos, DO;  Location: [place];  Service: Plastics;  Laterality: Right;  . LIPOSUCTION Bilateral 08/23/2013   Procedure: LIPOSUCTION;  Surgeon: Theodoro Kos, DO;  Location: [place];  Service: Plastics;  Laterality:  Bilateral;  . MASTECTOMY Right 2011  . MODIFIED RADICAL MASTECTOMY Right 03/11/2010  . NASAL SEPTUM SURGERY    . PORT-A-CATH REMOVAL Left 12/01/2010  . PORTACATH PLACEMENT Left 09/12/2009  . REDUCTION MAMMAPLASTY Left 08/2013  . REMOVAL OF TISSUE EXPANDER AND PLACEMENT OF IMPLANT Right 08/23/2013   Procedure: REMOVAL RIGHT TISSUE EXPANDER AND PLACEMENT OF IMPLANT TO RIGHT BREAST ;  Surgeon: Theodoro Kos, DO;  Location: [place];  Service: Plastics;  Laterality: Right;  . SUPRA-UMBILICAL HERNIA  2703  . TUBAL LIGATION  11/21/2008    There were no vitals filed for this visit.  Subjective Assessment - 05/03/17 1034    Subjective  Haven't gotten measured yet.     Pertinent History  Right breast cancer with mastectomy 2011; reconstruction; left breast reduction; h/o right UE lymphedema and right upper quadrant pain, treated in this clinic several times before.    Patient Stated Goals  I don't want to be here--I was trying not to come--I'm trying to help myself now.    Currently in Pain?  Yes    Pain Score  7     Pain Location  Neck    Pain Orientation  Right    Pain Descriptors / Indicators  Constant;Aching    Pain Type  Chronic pain    Pain Onset  1 to 4  weeks ago    Pain Frequency  Constant    Aggravating Factors   just always hurts    Pain Relieving Factors  injections            LYMPHEDEMA/ONCOLOGY QUESTIONNAIRE - 05/03/17 1036      Right Upper Extremity Lymphedema   15 cm Proximal to Olecranon Process  45.4 cm    10 cm Proximal to Olecranon Process  45.4 cm    Olecranon Process  30 cm    15 cm Proximal to Ulnar Styloid Process  27.7 cm    10 cm Proximal to Ulnar Styloid Process  23.3 cm    Just Proximal to Ulnar Styloid Process  16.6 cm    Across Hand at [employer]  19.6 cm    At [place] of 2nd Digit  5.9 cm    Other  131.6 after exhale    Other  127.9 after exhale               [facility] Adult PT Treatment/Exercise - 05/03/17 0001      Manual  Therapy   Compression Bandaging  Lotion applied, thin stockinette, Elastomull to all fingers, Artiflex x1, 1-6, 1-10, and 3-12 cm short stretch compression bandages from hand to axilla (one 12-cm in herring bone fashion)                 Short Term Clinic Goals - 04/13/17 1356      CC Short Term Goal  #1   Title  Decrease circumference measurement at 10 cm proximal to olecranon on the right to 45 cm or less.    Baseline  45.6 on right compared to 42.8 on left at eval; 44.3 on 04/11/17    Status  Achieved      CC Short Term Goal  #2   Title  Decrease circumference measurement of abdomen at 10 cm. superior to upper tip of umbilicus to 277 cm. or less    Baseline  135.1 cm. at eval; 129 on 04/11/17    Status  Achieved          Long Term Clinic Goals - 04/13/17 1358      CC Long Term Goal  #1   Title  Decrease circumference at 10 cm. superior to right olecranon to 44.5 cm. or less    Baseline  45.6 on right compared to 42.8 on left at eval; 44.3 on 04/11/17    Status  Achieved      CC Long Term Goal  #2   Title  decrease circumference of abdomen at 10 cm. superior to upper tip of umbilicus to 824 cm. or less    Baseline  135.1 cm. at eval; 129 on 04/11/17    Status  Achieved      CC Long Term Goal  #3   Title  Reduce Lymphedema Life Impact Scale to less than 60 percent impairment.    Status  On-going         Plan - 05/03/17 1110    Clinical Impression Statement  Pt arrived late again today so did not have time to measure and perform MLD. Her measurements overall were reduced from last time measured. Encouraged pt to call [name] to be measured for her new compression garments as pts arm is reduced well at this time. She verbalized understanding this and said she would call her today.     Rehab Potential  Good    Clinical Impairments Affecting Rehab Potential  chronic pain issues    PT Frequency  3x / week    PT Duration  6 weeks    PT Treatment/Interventions   ADLs/Self Care Home Management;Manual techniques;Manual lymph drainage;Compression bandaging;Taping;Patient/family education;Passive range of motion;Scar mobilization;Therapeutic exercise    PT Next Visit Plan   Cont complete decongestive therapy for right UE and abdomen and see if pt called [name] to be measured for compression garments.     Consulted and Agree with Plan of Care  Patient       Patient will benefit from skilled therapeutic intervention in order to improve the following deficits and impairments:  Increased edema, Pain, Decreased knowledge of use of DME  Visit Diagnosis: Postmastectomy lymphedema     Problem List Patient Active Problem List   Diagnosis Date Noted  . Dyspnea 09/23/2016  . Eczema 08/31/2016  . Essential hypertension 11/05/2015  . Cervical dystonia 08/25/2015  . Headache 07/09/2015  . Myofascial muscle pain 04/29/2015  . Adhesive capsulitis of right shoulder 04/29/2015  . Type 2 diabetes mellitus without complication ([place]) 38/03/1750  . Lower extremity edema 10/01/2014  . Malignant neoplasm of overlapping sites of right breast in female, estrogen receptor positive ([place]) 05/24/2014  . Myalgia and myositis 05/17/2014  . Neoplasm related pain 03/12/2014  . CN (constipation) 11/26/2013  . H/O reduction mammoplasty 10/30/2013  . Seizure ([place]) 10/06/2013  . Status post bilateral breast implants 08/31/2013  . Acquired absence of breast and absent nipple 08/23/2013  . S/P breast reconstruction, right 08/23/2013  . AC joint arthropathy 07/02/2013  . Routine adult health maintenance 05/29/2013  . Hypokalemia 05/21/2013  . Chronic pain 04/19/2013  . Elevated blood sugar 03/09/2013  . Acute bronchitis 02/22/2013  . Lymphedema 05/24/2011  . RHINOSINUSITIS, CHRONIC 04/08/2010  . Obesity 08/04/2006  . DEPRESSIVE DISORDER, NOS 08/04/2006  . Allergic rhinitis 08/04/2006    Otelia Limes, PTA 05/03/2017, 11:29 AM  [place], [place], [address] Phone: [phone]   Fax:  [phone]  Name: Karen Hopkins MRN: [id number] Date of Birth: [birth year]

## 2017-05-05 ENCOUNTER — Ambulatory Visit: Payer: Medicare Other

## 2017-05-05 DIAGNOSIS — I972 Postmastectomy lymphedema syndrome: Secondary | ICD-10-CM

## 2017-05-05 NOTE — Therapy (Signed)
[place], [place], [address] Phone: [phone]   Fax:  [phone]  Physical Therapy Treatment  Patient Details  Name: Karen Hopkins MRN: [id number] Date of Birth: 03/13/1976 Referring Provider: Dr. Alysia Penna   Encounter Date: 05/05/2017  PT End of Session - 05/05/17 1100    Visit Number  10    Number of Visits  19    Date for PT Re-Evaluation  05/21/17    PT Start Time  1031 Pt arrived late    PT Stop Time  1105    PT Time Calculation (min)  34 min    Activity Tolerance  Patient tolerated treatment well    Behavior During Therapy  [facility] for tasks assessed/performed       Past Medical History:  Diagnosis Date  . Anxiety   . Cancer ([place])   . Depression   . Essential hypertension 11/05/2015  . History of breast cancer 2011   right  . History of chemotherapy 2011  . History of radiation therapy 2011  . Hypertension    under control with med., has been on med. x 2 yr.  . Lymphedema of arm    right; no BP or puncture to right arm  . Personal history of chemotherapy 09/16/2009   rt breast  . Personal history of radiation therapy 05/2010   rt breast  . Pneumonia   . Seasonal allergies   . Sinus headache     Past Surgical History:  Procedure Laterality Date  . BREAST BIOPSY Right 08/26/2009  . BREAST REDUCTION SURGERY Left 08/23/2013   Procedure: LEFT BREAST REDUCTION  ;  Surgeon: Theodoro Kos, DO;  Location: [place];  Service: Plastics;  Laterality: Left;  . CESAREAN SECTION  07/15/2001; 03/18/2004; 11/21/2008  . LATISSIMUS FLAP TO BREAST Right 03/12/2013   Procedure: RIGHT BREAST LATISSIMUS FLAP WITH EXPANDER PLACEMENT;  Surgeon: Theodoro Kos, DO;  Location: [place];  Service: Plastics;  Laterality: Right;  . LIPOSUCTION Bilateral 08/23/2013   Procedure: LIPOSUCTION;  Surgeon: Theodoro Kos, DO;  Location: [place];  Service: Plastics;  Laterality:  Bilateral;  . MASTECTOMY Right 2011  . MODIFIED RADICAL MASTECTOMY Right 03/11/2010  . NASAL SEPTUM SURGERY    . PORT-A-CATH REMOVAL Left 12/01/2010  . PORTACATH PLACEMENT Left 09/12/2009  . REDUCTION MAMMAPLASTY Left 08/2013  . REMOVAL OF TISSUE EXPANDER AND PLACEMENT OF IMPLANT Right 08/23/2013   Procedure: REMOVAL RIGHT TISSUE EXPANDER AND PLACEMENT OF IMPLANT TO RIGHT BREAST ;  Surgeon: Theodoro Kos, DO;  Location: [place];  Service: Plastics;  Laterality: Right;  . SUPRA-UMBILICAL HERNIA  9702  . TUBAL LIGATION  11/21/2008    There were no vitals filed for this visit.  Subjective Assessment - 05/05/17 1033    Subjective  I couldn't find [name] phone number to have her measure me so I want you to call now. My bandages stayed on until last night when I took them off to shower.      Pertinent History  Right breast cancer with mastectomy 2011; reconstruction; left breast reduction; h/o right UE lymphedema and right upper quadrant pain, treated in this clinic several times before.    Patient Stated Goals  I don't want to be here--I was trying not to come--I'm trying to help myself now.    Currently in Pain?  Yes    Pain Score  6     Pain Location  Neck  Pain Orientation  Right    Pain Descriptors / Indicators  Constant;Aching    Pain Type  Chronic pain    Pain Onset  1 to 4 weeks ago    Pain Frequency  Constant    Aggravating Factors   moving wrong    Pain Relieving Factors  injections help                      [facility] Adult PT Treatment/Exercise - 05/05/17 0001      Manual Therapy   Compression Bandaging  Lotion applied, thin stockinette, Elastomull to all fingers, Artiflex x1, 1-6, 1-10, and 3-12 cm short stretch compression bandages from hand to axilla (one 12-cm in herring bone fashion)                 Short Term Clinic Goals - 04/13/17 1356      CC Short Term Goal  #1   Title  Decrease circumference measurement at 10 cm proximal  to olecranon on the right to 45 cm or less.    Baseline  45.6 on right compared to 42.8 on left at eval; 44.3 on 04/11/17    Status  Achieved      CC Short Term Goal  #2   Title  Decrease circumference measurement of abdomen at 10 cm. superior to upper tip of umbilicus to 096 cm. or less    Baseline  135.1 cm. at eval; 129 on 04/11/17    Status  Achieved          Long Term Clinic Goals - 04/13/17 1358      CC Long Term Goal  #1   Title  Decrease circumference at 10 cm. superior to right olecranon to 44.5 cm. or less    Baseline  45.6 on right compared to 42.8 on left at eval; 44.3 on 04/11/17    Status  Achieved      CC Long Term Goal  #2   Title  decrease circumference of abdomen at 10 cm. superior to upper tip of umbilicus to 045 cm. or less    Baseline  135.1 cm. at eval; 129 on 04/11/17    Status  Achieved      CC Long Term Goal  #3   Title  Reduce Lymphedema Life Impact Scale to less than 60 percent impairment.    Status  On-going         Plan - 05/05/17 1104    Clinical Impression Statement  Pt arrived late again today so only time to bandage. Called [facility] and pt will be measured Wednesday for new compression garments then.     Rehab Potential  Good    Clinical Impairments Affecting Rehab Potential  chronic pain issues    PT Frequency  3x / week    PT Duration  6 weeks    PT Treatment/Interventions  ADLs/Self Care Home Management;Manual techniques;Manual lymph drainage;Compression bandaging;Taping;Patient/family education;Passive range of motion;Scar mobilization;Therapeutic exercise    PT Next Visit Plan   Cont complete decongestive therapy for right UE and abdomen and see if pt called [name] to be measured for compression garments.     Consulted and Agree with Plan of Care  Patient       Patient will benefit from skilled therapeutic intervention in order to improve the following deficits and impairments:  Increased edema, Pain, Decreased knowledge of use  of DME  Visit Diagnosis: Postmastectomy lymphedema     Problem List Patient Active  Problem List   Diagnosis Date Noted  . Dyspnea 09/23/2016  . Eczema 08/31/2016  . Essential hypertension 11/05/2015  . Cervical dystonia 08/25/2015  . Headache 07/09/2015  . Myofascial muscle pain 04/29/2015  . Adhesive capsulitis of right shoulder 04/29/2015  . Type 2 diabetes mellitus without complication ([place]) 68/04/5725  . Lower extremity edema 10/01/2014  . Malignant neoplasm of overlapping sites of right breast in female, estrogen receptor positive ([place]) 05/24/2014  . Myalgia and myositis 05/17/2014  . Neoplasm related pain 03/12/2014  . CN (constipation) 11/26/2013  . H/O reduction mammoplasty 10/30/2013  . Seizure ([place]) 10/06/2013  . Status post bilateral breast implants 08/31/2013  . Acquired absence of breast and absent nipple 08/23/2013  . S/P breast reconstruction, right 08/23/2013  . AC joint arthropathy 07/02/2013  . Routine adult health maintenance 05/29/2013  . Hypokalemia 05/21/2013  . Chronic pain 04/19/2013  . Elevated blood sugar 03/09/2013  . Acute bronchitis 02/22/2013  . Lymphedema 05/24/2011  . RHINOSINUSITIS, CHRONIC 04/08/2010  . Obesity 08/04/2006  . DEPRESSIVE DISORDER, NOS 08/04/2006  . Allergic rhinitis 08/04/2006    Otelia Limes, PTA 05/05/2017, 11:09 AM  [place], [place], [address] Phone: [phone]   Fax:  [phone]  Name: Karen Hopkins MRN: [id number] Date of Birth: 07-Mar-1976

## 2017-05-06 ENCOUNTER — Ambulatory Visit: Payer: Medicare Other | Admitting: Physical Therapy

## 2017-05-06 DIAGNOSIS — I972 Postmastectomy lymphedema syndrome: Secondary | ICD-10-CM | POA: Diagnosis not present

## 2017-05-06 NOTE — Therapy (Signed)
[place], [place], [address] Phone: [phone]   Fax:  [phone]  Physical Therapy Treatment  Patient Details  Name: Karen Hopkins MRN: [id number] Date of Birth: Mar 01, 1976 Referring Provider: Dr. Alysia Penna   Encounter Date: 05/06/2017  PT End of Session - 05/06/17 1254    Visit Number  11    Number of Visits  19    Date for PT Re-Evaluation  05/21/17    PT Start Time  1203    PT Stop Time  1230    PT Time Calculation (min)  27 min    Activity Tolerance  Patient tolerated treatment well    Behavior During Therapy  [facility] for tasks assessed/performed       Past Medical History:  Diagnosis Date  . Anxiety   . Cancer ([place])   . Depression   . Essential hypertension 11/05/2015  . History of breast cancer 2011   right  . History of chemotherapy 2011  . History of radiation therapy 2011  . Hypertension    under control with med., has been on med. x 2 yr.  . Lymphedema of arm    right; no BP or puncture to right arm  . Personal history of chemotherapy 09/16/2009   rt breast  . Personal history of radiation therapy 05/2010   rt breast  . Pneumonia   . Seasonal allergies   . Sinus headache     Past Surgical History:  Procedure Laterality Date  . BREAST BIOPSY Right 08/26/2009  . BREAST REDUCTION SURGERY Left 08/23/2013   Procedure: LEFT BREAST REDUCTION  ;  Surgeon: Theodoro Kos, DO;  Location: [place];  Service: Plastics;  Laterality: Left;  . CESAREAN SECTION  07/15/2001; 03/18/2004; 11/21/2008  . LATISSIMUS FLAP TO BREAST Right 03/12/2013   Procedure: RIGHT BREAST LATISSIMUS FLAP WITH EXPANDER PLACEMENT;  Surgeon: Theodoro Kos, DO;  Location: [place];  Service: Plastics;  Laterality: Right;  . LIPOSUCTION Bilateral 08/23/2013   Procedure: LIPOSUCTION;  Surgeon: Theodoro Kos, DO;  Location: [place];  Service: Plastics;  Laterality: Bilateral;  .  MASTECTOMY Right 2011  . MODIFIED RADICAL MASTECTOMY Right 03/11/2010  . NASAL SEPTUM SURGERY    . PORT-A-CATH REMOVAL Left 12/01/2010  . PORTACATH PLACEMENT Left 09/12/2009  . REDUCTION MAMMAPLASTY Left 08/2013  . REMOVAL OF TISSUE EXPANDER AND PLACEMENT OF IMPLANT Right 08/23/2013   Procedure: REMOVAL RIGHT TISSUE EXPANDER AND PLACEMENT OF IMPLANT TO RIGHT BREAST ;  Surgeon: Theodoro Kos, DO;  Location: [place];  Service: Plastics;  Laterality: Right;  . SUPRA-UMBILICAL HERNIA  0174  . TUBAL LIGATION  11/21/2008    There were no vitals filed for this visit.  Subjective Assessment - 05/06/17 1251    Subjective  Thank you for seeing me.  I was just here yesterday but the bandages fell off.    Pertinent History  Right breast cancer with mastectomy 2011; reconstruction; left breast reduction; h/o right UE lymphedema and right upper quadrant pain, treated in this clinic several times before.                      [facility] Adult PT Treatment/Exercise - 05/06/17 0001      Manual Therapy   Compression Bandaging  Skin checked, lotion applied, then bandaged right UE as follows:   thick stockinette, Elastomull to first four fingers, Artiflex x 1, 1-6 cm, 1-10 cm., and 3-12 cm. short stretch  bandages from hand to axilla on right.  First two 12 cm. bandages done in herringbone pattern from elbow up.  Paper tape placed on finger wraps and tape also placed in a "T" pattern at dorsal hand/wrist to keep those bandages from slipping down, which she says is a problem.                Short Term Clinic Goals - 04/13/17 1356      CC Short Term Goal  #1   Title  Decrease circumference measurement at 10 cm proximal to olecranon on the right to 45 cm or less.    Baseline  45.6 on right compared to 42.8 on left at eval; 44.3 on 04/11/17    Status  Achieved      CC Short Term Goal  #2   Title  Decrease circumference measurement of abdomen at 10 cm. superior to upper tip of  umbilicus to 585 cm. or less    Baseline  135.1 cm. at eval; 129 on 04/11/17    Status  Achieved          Long Term Clinic Goals - 04/13/17 1358      CC Long Term Goal  #1   Title  Decrease circumference at 10 cm. superior to right olecranon to 44.5 cm. or less    Baseline  45.6 on right compared to 42.8 on left at eval; 44.3 on 04/11/17    Status  Achieved      CC Long Term Goal  #2   Title  decrease circumference of abdomen at 10 cm. superior to upper tip of umbilicus to 277 cm. or less    Baseline  135.1 cm. at eval; 129 on 04/11/17    Status  Achieved      CC Long Term Goal  #3   Title  Reduce Lymphedema Life Impact Scale to less than 60 percent impairment.    Status  On-going         Plan - 05/06/17 1255    Clinical Impression Statement  Pt. was an add-on to the schedule just to rebandage her because bandages had fallen off. That was done along with some extra taping to help keep bandages in place regardless of patient's activities.    Rehab Potential  Good    Clinical Impairments Affecting Rehab Potential  chronic pain issues    PT Frequency  3x / week    PT Duration  6 weeks    PT Treatment/Interventions  ADLs/Self Care Home Management;Manual techniques;Manual lymph drainage;Compression bandaging;Taping;Patient/family education;Passive range of motion;Scar mobilization;Therapeutic exercise    PT Next Visit Plan   Cont complete decongestive therapy for right UE and abdomen    Recommended Other Services  Pt. to be measured next Wednesday for new garments.       Patient will benefit from skilled therapeutic intervention in order to improve the following deficits and impairments:  Increased edema, Pain, Decreased knowledge of use of DME  Visit Diagnosis: Postmastectomy lymphedema   G-Codes - 05/24/17 1649    Functional Assessment Tool Used (Outpatient Only)  lymphedema life impact scale    Functional Limitation  Self care    Self Care Current Status (O2423)  At least  60 percent but less than 80 percent impaired, limited or restricted    Self Care Goal Status (N3614)  At least 40 percent but less than 60 percent impaired, limited or restricted       Problem List Patient Active Problem List  Diagnosis Date Noted  . Dyspnea 09/23/2016  . Eczema 08/31/2016  . Essential hypertension 11/05/2015  . Cervical dystonia 08/25/2015  . Headache 07/09/2015  . Myofascial muscle pain 04/29/2015  . Adhesive capsulitis of right shoulder 04/29/2015  . Type 2 diabetes mellitus without complication ([place]) 61/53/7943  . Lower extremity edema 10/01/2014  . Malignant neoplasm of overlapping sites of right breast in female, estrogen receptor positive ([place]) 05/24/2014  . Myalgia and myositis 05/17/2014  . Neoplasm related pain 03/12/2014  . CN (constipation) 11/26/2013  . H/O reduction mammoplasty 10/30/2013  . Seizure ([place]) 10/06/2013  . Status post bilateral breast implants 08/31/2013  . Acquired absence of breast and absent nipple 08/23/2013  . S/P breast reconstruction, right 08/23/2013  . AC joint arthropathy 07/02/2013  . Routine adult health maintenance 05/29/2013  . Hypokalemia 05/21/2013  . Chronic pain 04/19/2013  . Elevated blood sugar 03/09/2013  . Acute bronchitis 02/22/2013  . Lymphedema 05/24/2011  . RHINOSINUSITIS, CHRONIC 04/08/2010  . Obesity 08/04/2006  . DEPRESSIVE DISORDER, NOS 08/04/2006  . Allergic rhinitis 08/04/2006    Johnathon Olden 05/06/2017, 12:57 PM  [place], [place], [address] Phone: [phone]   Fax:  [phone]  Name: Karen Hopkins MRN: [id number] Date of Birth: 12/16/1975  Serafina Royals, PT 05/06/17 12:57 PM

## 2017-05-09 ENCOUNTER — Ambulatory Visit: Payer: Medicare Other | Attending: Physical Medicine & Rehabilitation

## 2017-05-09 DIAGNOSIS — M25511 Pain in right shoulder: Secondary | ICD-10-CM | POA: Insufficient documentation

## 2017-05-09 DIAGNOSIS — R293 Abnormal posture: Secondary | ICD-10-CM | POA: Insufficient documentation

## 2017-05-09 DIAGNOSIS — G8929 Other chronic pain: Secondary | ICD-10-CM | POA: Insufficient documentation

## 2017-05-09 DIAGNOSIS — M542 Cervicalgia: Secondary | ICD-10-CM | POA: Insufficient documentation

## 2017-05-09 DIAGNOSIS — I972 Postmastectomy lymphedema syndrome: Secondary | ICD-10-CM | POA: Diagnosis not present

## 2017-05-09 NOTE — Therapy (Signed)
[place], [place], [address] Phone: [phone]   Fax:  [phone]  Physical Therapy Treatment  Patient Details  Name: Karen Hopkins MRN: [id number] Date of Birth: [birth year] Referring Provider: Dr. Alysia Hopkins   Encounter Date: 05/09/2017  PT End of Session - 05/09/17 0928    Visit Number  12    Number of Visits  19    Date for PT Re-Evaluation  05/21/17    PT Start Time  1941 Pt arrived a few mins late    PT Stop Time  0930    PT Time Calculation (min)  38 min    Activity Tolerance  Patient tolerated treatment well    Behavior During Therapy  [facility] for tasks assessed/performed       Past Medical History:  Diagnosis Date  . Anxiety   . Cancer ([place])   . Depression   . Essential hypertension 11/05/2015  . History of breast cancer 2011   right  . History of chemotherapy 2011  . History of radiation therapy 2011  . Hypertension    under control with med., has been on med. x 2 yr.  . Lymphedema of arm    right; no BP or puncture to right arm  . Personal history of chemotherapy 09/16/2009   rt breast  . Personal history of radiation therapy 05/2010   rt breast  . Pneumonia   . Seasonal allergies   . Sinus headache     Past Surgical History:  Procedure Laterality Date  . BREAST BIOPSY Right 08/26/2009  . BREAST REDUCTION SURGERY Left 08/23/2013   Procedure: LEFT BREAST REDUCTION  ;  Surgeon: Theodoro Kos, DO;  Location: [place];  Service: Plastics;  Laterality: Left;  . CESAREAN SECTION  07/15/2001; 03/18/2004; 11/21/2008  . LATISSIMUS FLAP TO BREAST Right 03/12/2013   Procedure: RIGHT BREAST LATISSIMUS FLAP WITH EXPANDER PLACEMENT;  Surgeon: Theodoro Kos, DO;  Location: [place];  Service: Plastics;  Laterality: Right;  . LIPOSUCTION Bilateral 08/23/2013   Procedure: LIPOSUCTION;  Surgeon: Theodoro Kos, DO;  Location: [place];  Service: Plastics;   Laterality: Bilateral;  . MASTECTOMY Right 2011  . MODIFIED RADICAL MASTECTOMY Right 03/11/2010  . NASAL SEPTUM SURGERY    . PORT-A-CATH REMOVAL Left 12/01/2010  . PORTACATH PLACEMENT Left 09/12/2009  . REDUCTION MAMMAPLASTY Left 08/2013  . REMOVAL OF TISSUE EXPANDER AND PLACEMENT OF IMPLANT Right 08/23/2013   Procedure: REMOVAL RIGHT TISSUE EXPANDER AND PLACEMENT OF IMPLANT TO RIGHT BREAST ;  Surgeon: Theodoro Kos, DO;  Location: [place];  Service: Plastics;  Laterality: Right;  . SUPRA-UMBILICAL HERNIA  [id number]  . TUBAL LIGATION  11/21/2008    There were no vitals filed for this visit.  Subjective Assessment - 05/09/17 0856    Subjective  Nothing new since I was here last. Karen Hopkins was supposed to call me over the weekend to schedule me for my fitting Wednesday but I didn't hear from her so I'll call her when I get home. My neck isn't hurting right now, just feels achy. It normally isn't as bad in the morning.     Pertinent History  Right breast cancer with mastectomy 2011; reconstruction; left breast reduction; h/o right UE lymphedema and right upper quadrant pain, treated in this clinic several times before.    Patient Stated Goals  I don't want to be here--I was trying not to come--I'm trying to help myself now.  Currently in Pain?  No/denies                      [facility] Adult PT Treatment/Exercise - 05/09/17 0001      Manual Therapy   Manual Lymphatic Drainage (MLD)  In Supine: Short neck, superficial and deep abdominals, Rt inguinal and Lt axillary nodes, Rt axillo-inguinal and anterior inter-axillary anastomosis, then Rt UE from lateral shoulder to dorsal hand working proximal to distal then retracing all steps.    Compression Bandaging  Skin checked, lotion applied, then bandaged right UE as follows:   thick stockinette, Elastomull to first four fingers, Artiflex x 1, 1-6 cm, 1-10 cm., and 3-12 cm. short stretch bandages from hand to axilla on right.   First two 12 cm. bandages done in herringbone pattern from elbow up.  Paper tape placed on finger wraps and tape also placed in a "T" pattern at dorsal hand/wrist to keep those bandages from slipping down, which she says is a problem.                Short Term Clinic Goals - 04/13/17 1356      CC Short Term Goal  #1   Title  Decrease circumference measurement at 10 cm proximal to olecranon on the right to 45 cm or less.    Baseline  45.6 on right compared to 42.8 on left at eval; 44.3 on 04/11/17    Status  Achieved      CC Short Term Goal  #2   Title  Decrease circumference measurement of abdomen at 10 cm. superior to upper tip of umbilicus to 702 cm. or less    Baseline  135.1 cm. at eval; 129 on 04/11/17    Status  Achieved          Long Term Clinic Goals - 04/13/17 1358      CC Long Term Goal  #1   Title  Decrease circumference at 10 cm. superior to right olecranon to 44.5 cm. or less    Baseline  45.6 on right compared to 42.8 on left at eval; 44.3 on 04/11/17    Status  Achieved      CC Long Term Goal  #2   Title  decrease circumference of abdomen at 10 cm. superior to upper tip of umbilicus to 637 cm. or less    Baseline  135.1 cm. at eval; 129 on 04/11/17    Status  Achieved      CC Long Term Goal  #3   Title  Reduce Lymphedema Life Impact Scale to less than 60 percent impairment.    Status  On-going         Plan - 05/09/17 0929    Clinical Impression Statement  Pt plans to call Karen Hopkins this morning to get on her schedule when she is in town Wednesday to get measured for compression garments. She is tolerating bandaging well with no skin break downs.     Rehab Potential  Good    Clinical Impairments Affecting Rehab Potential  chronic pain issues    PT Frequency  3x / week    PT Duration  6 weeks    PT Treatment/Interventions  ADLs/Self Care Home Management;Manual techniques;Manual lymph drainage;Compression bandaging;Taping;Patient/family  education;Passive range of motion;Scar mobilization;Therapeutic exercise    Consulted and Agree with Plan of Care  Patient       Patient will benefit from skilled therapeutic intervention in order to improve the following deficits and  impairments:  Increased edema, Pain, Decreased knowledge of use of DME  Visit Diagnosis: Postmastectomy lymphedema     Problem List Patient Active Problem List   Diagnosis Date Noted  . Dyspnea 09/23/2016  . Eczema 08/31/2016  . Essential hypertension 11/05/2015  . Cervical dystonia 08/25/2015  . Headache 07/09/2015  . Myofascial muscle pain 04/29/2015  . Adhesive capsulitis of right shoulder 04/29/2015  . Type 2 diabetes mellitus without complication ([place]) 10/15/209  . Lower extremity edema 10/01/2014  . Malignant neoplasm of overlapping sites of right breast in female, estrogen receptor positive ([place]) 05/24/2014  . Myalgia and myositis 05/17/2014  . Neoplasm related pain 03/12/2014  . CN (constipation) 11/26/2013  . H/O reduction mammoplasty 10/30/2013  . Seizure ([place]) 10/06/2013  . Status post bilateral breast implants 08/31/2013  . Acquired absence of breast and absent nipple 08/23/2013  . S/P breast reconstruction, right 08/23/2013  . AC joint arthropathy 07/02/2013  . Routine adult health maintenance 05/29/2013  . Hypokalemia 05/21/2013  . Chronic pain 04/19/2013  . Elevated blood sugar 03/09/2013  . Acute bronchitis 02/22/2013  . Lymphedema 05/24/2011  . RHINOSINUSITIS, CHRONIC 04/08/2010  . Obesity 08/04/2006  . DEPRESSIVE DISORDER, NOS 08/04/2006  . Allergic rhinitis 08/04/2006    Otelia Limes, PTA 05/09/2017, 9:33 AM  [place], [place], [address] Phone: [phone]   Fax:  [phone]  Name: Karen Hopkins MRN: [id number] Date of Birth: Jun 25, 1975

## 2017-05-11 ENCOUNTER — Ambulatory Visit: Payer: Medicare Other

## 2017-05-11 DIAGNOSIS — I972 Postmastectomy lymphedema syndrome: Secondary | ICD-10-CM

## 2017-05-11 DIAGNOSIS — M25511 Pain in right shoulder: Secondary | ICD-10-CM | POA: Diagnosis not present

## 2017-05-11 DIAGNOSIS — R293 Abnormal posture: Secondary | ICD-10-CM | POA: Diagnosis not present

## 2017-05-11 DIAGNOSIS — M542 Cervicalgia: Secondary | ICD-10-CM | POA: Diagnosis not present

## 2017-05-11 DIAGNOSIS — G8929 Other chronic pain: Secondary | ICD-10-CM | POA: Diagnosis not present

## 2017-05-11 NOTE — Therapy (Addendum)
, [place], [place], [address] Phone: [phone]   Fax:  [phone]  Physical Therapy Treatment  Patient Details  Name: Karen Hopkins MRN: [id number] Date of Birth: Mar 23, 1976 Referring Provider: Dr. Alysia Penna   Encounter Date: 05/11/2017  PT End of Session - 05/11/17 0922    Visit Number  13    Number of Visits  19    Date for PT Re-Evaluation  05/21/17    PT Start Time  0848 2 units is correct    PT Stop Time  0934    PT Time Calculation (min)  46 min    Activity Tolerance  Patient tolerated treatment well    Behavior During Therapy  [facility] for tasks assessed/performed       Past Medical History:  Diagnosis Date  . Anxiety   . Cancer ([place])   . Depression   . Essential hypertension 11/05/2015  . History of breast cancer 2011   right  . History of chemotherapy 2011  . History of radiation therapy 2011  . Hypertension    under control with med., has been on med. x 2 yr.  . Lymphedema of arm    right; no BP or puncture to right arm  . Personal history of chemotherapy 09/16/2009   rt breast  . Personal history of radiation therapy 05/2010   rt breast  . Pneumonia   . Seasonal allergies   . Sinus headache     Past Surgical History:  Procedure Laterality Date  . BREAST BIOPSY Right 08/26/2009  . BREAST REDUCTION SURGERY Left 08/23/2013   Procedure: LEFT BREAST REDUCTION  ;  Surgeon: Theodoro Kos, DO;  Location: [place];  Service: Plastics;  Laterality: Left;  . CESAREAN SECTION  07/15/2001; 03/18/2004; 11/21/2008  . LATISSIMUS FLAP TO BREAST Right 03/12/2013   Procedure: RIGHT BREAST LATISSIMUS FLAP WITH EXPANDER PLACEMENT;  Surgeon: Theodoro Kos, DO;  Location: [place];  Service: Plastics;  Laterality: Right;  . LIPOSUCTION Bilateral 08/23/2013   Procedure: LIPOSUCTION;  Surgeon: Theodoro Kos, DO;  Location: [place];  Service: Plastics;  Laterality:  Bilateral;  . MASTECTOMY Right 2011  . MODIFIED RADICAL MASTECTOMY Right 03/11/2010  . NASAL SEPTUM SURGERY    . PORT-A-CATH REMOVAL Left 12/01/2010  . PORTACATH PLACEMENT Left 09/12/2009  . REDUCTION MAMMAPLASTY Left 08/2013  . REMOVAL OF TISSUE EXPANDER AND PLACEMENT OF IMPLANT Right 08/23/2013   Procedure: REMOVAL RIGHT TISSUE EXPANDER AND PLACEMENT OF IMPLANT TO RIGHT BREAST ;  Surgeon: Theodoro Kos, DO;  Location: [place];  Service: Plastics;  Laterality: Right;  . SUPRA-UMBILICAL HERNIA  [id number]  . TUBAL LIGATION  11/21/2008    There were no vitals filed for this visit.  Subjective Assessment - 05/11/17 0850    Subjective  I get measured for my new compression garments here by [name] after our session today, so I can't be wrapped. But if I notice swelling over the next few days I'll try to wrap my arm myself as best I can. My abdominal swelling still keeps fluctuating.  (Pt not c/o any neck pain this morning)    Pertinent History  Right breast cancer with mastectomy 2011; reconstruction; left breast reduction; h/o right UE lymphedema and right upper quadrant pain, treated in this clinic several times before.    Patient Stated Goals  I don't want to be here--I was trying not to come--I'm trying to help myself now.  Currently in Pain?  No/denies            LYMPHEDEMA/ONCOLOGY QUESTIONNAIRE - 05/11/17 0852      Right Upper Extremity Lymphedema   15 cm Proximal to Olecranon Process  42.9 cm    10 cm Proximal to Olecranon Process  43 cm    Olecranon Process  29.7 cm    15 cm Proximal to Ulnar Styloid Process  28.4 cm    10 cm Proximal to Ulnar Styloid Process  23.9 cm    Just Proximal to Ulnar Styloid Process  16.8 cm    Across Hand at [employer]  19.5 cm    At [place] of 2nd Digit  6 cm               [facility] Adult PT Treatment/Exercise - 05/11/17 0001      Manual Therapy   Manual Lymphatic Drainage (MLD)  In Supine: Short neck, superficial and  deep abdominals (had pt perform this slowly for maximum effect today), Rt inguinal and Lt axillary nodes, Rt axillo-inguinal and anterior inter-axillary anastomosis, then Rt UE from lateral shoulder to dorsal hand working proximal to distal then retracing all steps.                Short Term Clinic Goals - 04/13/17 1356      CC Short Term Goal  #1   Title  Decrease circumference measurement at 10 cm proximal to olecranon on the right to 45 cm or less.    Baseline  45.6 on right compared to 42.8 on left at eval; 44.3 on 04/11/17    Status  Achieved      CC Short Term Goal  #2   Title  Decrease circumference measurement of abdomen at 10 cm. superior to upper tip of umbilicus to 321 cm. or less    Baseline  135.1 cm. at eval; 129 on 04/11/17    Status  Achieved          Long Term Clinic Goals - 05/11/17 1231      CC Long Term Goal  #1   Title  Decrease circumference at 10 cm. superior to right olecranon to 44.5 cm. or less    Baseline  45.6 on right compared to 42.8 on left at eval; 44.3 on 04/11/17; 43 cm-05/11/17    Status  Achieved      CC Long Term Goal  #2   Title  decrease circumference of abdomen at 10 cm. superior to upper tip of umbilicus to 224 cm. or less    Baseline  135.1 cm. at eval; 129 on 04/11/17; 127.9-11/27/18    Status  Achieved      CC Long Term Goal  #3   Title  Reduce Lymphedema Life Impact Scale to less than 60 percent impairment.    Baseline  87% at eval on 03/16/17; 65%-05/05/17    Status  On-going         Plan - 05/11/17 1226    Clinical Impression Statement  Pt was measured for new compression garments today after appointment so did not bandage pt, though she reports feeling comfortable enough with this that she can bandage herself prn until next visit. So focused on MLD today, especially at abdominals and both pt and therapist noted improvement here today by end of session. (Note, accidently worked on [employer] briefly during session so did not  charge for that time).    Rehab Potential  Good    Clinical  Impairments Affecting Rehab Potential  chronic pain issues    PT Frequency  3x / week    PT Duration  6 weeks    PT Treatment/Interventions  ADLs/Self Care Home Management;Manual techniques;Manual lymph drainage;Compression bandaging;Taping;Patient/family education;Passive range of motion;Scar mobilization;Therapeutic exercise    PT Next Visit Plan   Cont complete decongestive therapy for right UE and abdomen until garments arrive.    Consulted and Agree with Plan of Care  Patient       Patient will benefit from skilled therapeutic intervention in order to improve the following deficits and impairments:  Increased edema, Pain, Decreased knowledge of use of DME  Visit Diagnosis: Postmastectomy lymphedema     Problem List Patient Active Problem List   Diagnosis Date Noted  . Dyspnea 09/23/2016  . Eczema 08/31/2016  . Essential hypertension 11/05/2015  . Cervical dystonia 08/25/2015  . Headache 07/09/2015  . Myofascial muscle pain 04/29/2015  . Adhesive capsulitis of right shoulder 04/29/2015  . Type 2 diabetes mellitus without complication ([place]) 74/45/1460  . Lower extremity edema 10/01/2014  . Malignant neoplasm of overlapping sites of right breast in female, estrogen receptor positive ([place]) 05/24/2014  . Myalgia and myositis 05/17/2014  . Neoplasm related pain 03/12/2014  . CN (constipation) 11/26/2013  . H/O reduction mammoplasty 10/30/2013  . Seizure ([place]) 10/06/2013  . Status post bilateral breast implants 08/31/2013  . Acquired absence of breast and absent nipple 08/23/2013  . S/P breast reconstruction, right 08/23/2013  . AC joint arthropathy 07/02/2013  . Routine adult health maintenance 05/29/2013  . Hypokalemia 05/21/2013  . Chronic pain 04/19/2013  . Elevated blood sugar 03/09/2013  . Acute bronchitis 02/22/2013  . Lymphedema 05/24/2011  . RHINOSINUSITIS, CHRONIC 04/08/2010  . Obesity 08/04/2006  .  DEPRESSIVE DISORDER, NOS 08/04/2006  . Allergic rhinitis 08/04/2006    Otelia Limes, PTA 05/11/2017, 12:33 PM  [place], [place], [address] Phone: [phone]   Fax:  [phone]  Name: Karen Hopkins MRN: [id number] Date of Birth: 20-Dec-1975  PHYSICAL THERAPY DISCHARGE SUMMARY  Visits from Start of Care: 13  Current functional level related to goals / functional outcomes: As noted above, mostly met.   Remaining deficits: See above.   Education / Equipment: New garments have been ordered. Plan: Patient agrees to discharge.  Patient goals were partially met. Patient is being discharged due to the patient's request.  We are discharging her from lymphedema treatment, but she will be continuing pain management therapy.?????    Serafina Royals, PT 06/13/17 9:47 AM

## 2017-05-12 ENCOUNTER — Ambulatory Visit: Payer: Medicare Other | Admitting: Physical Medicine & Rehabilitation

## 2017-05-13 ENCOUNTER — Encounter: Payer: Self-pay | Admitting: Registered Nurse

## 2017-05-13 ENCOUNTER — Encounter: Payer: Medicare Other | Attending: Registered Nurse | Admitting: Registered Nurse

## 2017-05-13 VITALS — BP 114/81 | HR 90 | Resp 14

## 2017-05-13 DIAGNOSIS — M545 Low back pain, unspecified: Secondary | ICD-10-CM

## 2017-05-13 DIAGNOSIS — Z5181 Encounter for therapeutic drug level monitoring: Secondary | ICD-10-CM | POA: Diagnosis not present

## 2017-05-13 DIAGNOSIS — Z79899 Other long term (current) drug therapy: Secondary | ICD-10-CM | POA: Diagnosis not present

## 2017-05-13 DIAGNOSIS — I890 Lymphedema, not elsewhere classified: Secondary | ICD-10-CM | POA: Diagnosis not present

## 2017-05-13 DIAGNOSIS — M546 Pain in thoracic spine: Secondary | ICD-10-CM | POA: Diagnosis not present

## 2017-05-13 DIAGNOSIS — G894 Chronic pain syndrome: Secondary | ICD-10-CM | POA: Diagnosis not present

## 2017-05-13 DIAGNOSIS — G893 Neoplasm related pain (acute) (chronic): Secondary | ICD-10-CM | POA: Diagnosis not present

## 2017-05-13 DIAGNOSIS — G243 Spasmodic torticollis: Secondary | ICD-10-CM

## 2017-05-13 MED ORDER — OXYCODONE HCL 5 MG PO TABS: 5.0000 mg | ORAL_TABLET | Freq: Four times a day (QID) | ORAL | 0 refills | Status: DC | PRN

## 2017-05-13 MED ORDER — TIZANIDINE HCL 4 MG PO TABS: ORAL_TABLET | ORAL | 3 refills | Status: DC

## 2017-05-13 NOTE — Progress Notes (Addendum)
Subjective:    Patient ID: Karen Hopkins, female    DOB: [birth year], 41 y.o.   MRN: [id number]  HPI: [name] is a 41year old female who returns for follow up appointmentfor chronic pain and medication refill. She states her pain is located in her neck radiating into her right shoulder and lower back pain. [name] very tearful stating she is having excruciating neck pain, she was asked about the onset of the neck pain she states a few days ago. She had Botox last month, Dr. Letta Pate assess her cervical region, no correlation to Botox. Emotional support given.  She rates her pain 7.  Her current exercise regime is walking and performing stretching exercises.  [name] Morphine equivalent is 30.00MME.  She/he  is also prescribed Alprazolam by Dr.Gustav .We have discussed the black box warning of using opioids and benzodiazepines.I highlighted the dangers of using these drugs together and discussed the adverse events including respiratory suppression, overdose, cognitive impairment and importance of  compliance with current regimen. She verbalizes understanding, we will continue to monitor and adjust as indicated.    At the end of office visit, [name] states " I don't know why I was crying", she denies any emotional stress. She has counseling at [facility] and her counselor is Mickel Baas. She was encouraged to continue with counseling, she verbalizes understanding.   Ms. [name] last UDS was performed on 10/04/2016, it was consistent.   Pain Inventory Average Pain 8 Pain Right Now 7 My pain is intermittent, constant, sharp, burning, dull, stabbing, tingling and aching  In the last 24 hours, has pain interfered with the following? General activity 5 Relation with others 5 Enjoyment of life 5 What TIME of day is your pain at its worst? all Sleep (in general) Poor  Pain is worse with: walking, bending, sitting, inactivity, standing and some activites Pain improves with:  therapy/exercise, medication, TENS and injections Relief from Meds: 8  Mobility walk without assistance  Function disabled: date disabled .  Neuro/Psych bladder control problems trouble walking spasms depression anxiety  Prior Studies Any changes since last visit?  no  Physicians involved in your care Any changes since last visit?  no   Family History  Problem Relation Age of Onset  . Hypertension Mother   . Diabetes type II Father   . Prostate cancer Father   . Stroke Neg Hx    Social History   Socioeconomic History  . Marital status: Single    Spouse name: None  . Number of children: 3  . Years of education: 23  . Highest education level: None  Social Needs  . Financial resource strain: None  . Food insecurity - worry: None  . Food insecurity - inability: None  . Transportation needs - medical: None  . Transportation needs - non-medical: None  Occupational History  . Occupation: Disability   Tobacco Use  . Smoking status: Former Smoker    Packs/day: 0.25    Years: 5.00    Pack years: 1.25    Types: Cigarettes    Start date: 06/07/2002    Last attempt to quit: 04/26/2010    Years since quitting: 7.0  . Smokeless tobacco: Never Used  Substance and Sexual Activity  . Alcohol use: No  . Drug use: No  . Sexual activity: Not Currently    Birth control/protection: Surgical  Other Topics Concern  . None  Social History Narrative   Lives with kids  Caffeine use: none    Past Surgical History:  Procedure Laterality Date  . BREAST BIOPSY Right 08/26/2009  . BREAST REDUCTION SURGERY Left 08/23/2013   Procedure: LEFT BREAST REDUCTION  ;  Surgeon: Theodoro Kos, DO;  Location: [place];  Service: Plastics;  Laterality: Left;  . CESAREAN SECTION  07/15/2001; 03/18/2004; 11/21/2008  . LATISSIMUS FLAP TO BREAST Right 03/12/2013   Procedure: RIGHT BREAST LATISSIMUS FLAP WITH EXPANDER PLACEMENT;  Surgeon: Theodoro Kos, DO;  Location: [place];   Service: Plastics;  Laterality: Right;  . LIPOSUCTION Bilateral 08/23/2013   Procedure: LIPOSUCTION;  Surgeon: Theodoro Kos, DO;  Location: [place];  Service: Plastics;  Laterality: Bilateral;  . MASTECTOMY Right 2011  . MODIFIED RADICAL MASTECTOMY Right 03/11/2010  . NASAL SEPTUM SURGERY    . PORT-A-CATH REMOVAL Left 12/01/2010  . PORTACATH PLACEMENT Left 09/12/2009  . REDUCTION MAMMAPLASTY Left 08/2013  . REMOVAL OF TISSUE EXPANDER AND PLACEMENT OF IMPLANT Right 08/23/2013   Procedure: REMOVAL RIGHT TISSUE EXPANDER AND PLACEMENT OF IMPLANT TO RIGHT BREAST ;  Surgeon: Theodoro Kos, DO;  Location: [place];  Service: Plastics;  Laterality: Right;  . SUPRA-UMBILICAL HERNIA  [id number]  . TUBAL LIGATION  11/21/2008   Past Medical History:  Diagnosis Date  . Anxiety   . Cancer ([place])   . Depression   . Essential hypertension 11/05/2015  . History of breast cancer 2011   right  . History of chemotherapy 2011  . History of radiation therapy 2011  . Hypertension    under control with med., has been on med. x 2 yr.  . Lymphedema of arm    right; no BP or puncture to right arm  . Personal history of chemotherapy 09/16/2009   rt breast  . Personal history of radiation therapy 05/2010   rt breast  . Pneumonia   . Seasonal allergies   . Sinus headache    BP 131/85 (BP Location: Right Arm, Patient Position: Sitting, Cuff Size: Large)   Pulse (!) 101   Resp 14   LMP 07/22/2014 (Approximate)   SpO2 98%   Opioid Risk Score:   Fall Risk Score:  `1  Depression screen PHQ 2/9  Depression screen [facility] 2/9 08/31/2016 07/22/2016 07/20/2016 07/08/2016 08/04/2015 06/25/2015 03/04/2015  Decreased Interest 0 0 0 0 0 0 0  Down, Depressed, Hopeless 0 0 0 0 0 0 0  PHQ - 2 Score 0 0 0 0 0 0 0  Some recent data might be hidden     Review of Systems  Constitutional: Negative.   HENT: Negative.   Eyes: Negative.   Respiratory: Negative.   Cardiovascular: Negative.     Gastrointestinal: Positive for constipation.  Endocrine: Negative.   Genitourinary: Positive for frequency and urgency.  Musculoskeletal: Positive for arthralgias, back pain, gait problem, neck pain and neck stiffness.  Skin: Negative.   Allergic/Immunologic: Negative.   Hematological: Negative.   Psychiatric/Behavioral: Negative.   All other systems reviewed and are negative.      Objective:   Physical Exam  Constitutional: She is oriented to [place], place, and time. She appears well-developed and well-nourished.  HENT:  Head: Normocephalic and atraumatic.  Neck: Normal range of motion. Neck supple.  Cervical Paraspinal Tenderness: C-5-C-6  Cardiovascular: Normal rate and regular rhythm.  Pulmonary/Chest: Effort normal and breath sounds normal.  Musculoskeletal:  Normal Muscle Bulk and Muscle Testing Reveals: Upper Extremities: Left: Full ROM and Muscle Strength 5/5 Right: Decreased ROM 45 Degrees and  Muscle Strength 5/5 Thoracic Paraspinal Tenderness: T-1-T-3 Mainly Right Side Lumbar Paraspinal Tenderness: L-3-L-5 Lower Extremities: Full ROM and Muscle Strength 5/5 Arises from Table with ease Narrow Based Gait  Neurological: She is alert and oriented to [place], place, and time.  Skin: Skin is warm and dry.  Psychiatric: She has a normal mood and affect.  Nursing note and vitals reviewed.         Assessment & Plan:  1.Myofascial pain syndrome chronic postoperative, as well as post radiation: S/P Breast Reconstruction Surgery x 2. RX: Physical Therapy ( Dry Needling)  05/13/2017 Refilled: Oxycodone 5 mg one tablet every 6 hours as needed for severe pain  #120.Continue HEP. We will continue the opioid monitoring program, this consists of regular clinic visits, examinations, urine drug screen, pill counts as well as use of [place] Controlled Substance Reporting System. 2. Neoplasm Related Pain: Continue Current Medication Regime. 05/13/2017 3. Lymphedema of Right  Upper Extremity: Oncology Following. 05/13/2017 4. Cervical Dystonia: S/P Botox Injection with Dr. Letta Pate. 05/13/2017 5. Obesity: Continue with Healthy Diet and Exercise Regime. 05/13/2017 6. Muscle Spasms: Continue Tizanidine. 05/13/2017 7. Depression: Continue Cymbalta and Counseling at [facility]. 05/13/2017 8.Chronic Bilateral Knee Pai/ OA: Continue Voltaren Jel. 05/13/2017  20 minutes of face to face patient care time was spent during this visit. All questions were encouraged and answered.   F/U in 1 month

## 2017-05-18 ENCOUNTER — Ambulatory Visit: Payer: Medicare Other

## 2017-05-20 ENCOUNTER — Ambulatory Visit: Payer: Medicare Other | Admitting: Physical Therapy

## 2017-05-23 ENCOUNTER — Other Ambulatory Visit: Payer: Self-pay

## 2017-05-23 MED ORDER — POLYETHYLENE GLYCOL 3350 17 GM/SCOOP PO POWD: 17.0000 g | Freq: Every day | ORAL | 5 refills | Status: DC

## 2017-05-25 ENCOUNTER — Telehealth: Payer: Self-pay | Admitting: *Deleted

## 2017-05-25 ENCOUNTER — Other Ambulatory Visit (HOSPITAL_BASED_OUTPATIENT_CLINIC_OR_DEPARTMENT_OTHER): Payer: Medicare Other

## 2017-05-25 ENCOUNTER — Other Ambulatory Visit: Payer: Self-pay | Admitting: *Deleted

## 2017-05-25 ENCOUNTER — Ambulatory Visit (HOSPITAL_BASED_OUTPATIENT_CLINIC_OR_DEPARTMENT_OTHER): Payer: Medicare Other

## 2017-05-25 VITALS — BP 138/90 | HR 100 | Temp 98.5°F

## 2017-05-25 DIAGNOSIS — C50811 Malignant neoplasm of overlapping sites of right female breast: Secondary | ICD-10-CM

## 2017-05-25 DIAGNOSIS — E876 Hypokalemia: Secondary | ICD-10-CM

## 2017-05-25 DIAGNOSIS — Z5111 Encounter for antineoplastic chemotherapy: Secondary | ICD-10-CM

## 2017-05-25 DIAGNOSIS — Z170 Estrogen receptor positive status [ER+]: Principal | ICD-10-CM

## 2017-05-25 DIAGNOSIS — C50911 Malignant neoplasm of unspecified site of right female breast: Secondary | ICD-10-CM

## 2017-05-25 LAB — CBC WITH DIFFERENTIAL/PLATELET
BASO%: 0.3 % (ref 0.0–2.0)
BASOS ABS: 0 10*3/uL (ref 0.0–0.1)
EOS ABS: 0.6 10*3/uL — AB (ref 0.0–0.5)
EOS%: 6.6 % (ref 0.0–7.0)
HEMATOCRIT: 40.1 % (ref 34.8–46.6)
HEMOGLOBIN: 13 g/dL (ref 11.6–15.9)
LYMPH#: 3.1 10*3/uL (ref 0.9–3.3)
LYMPH%: 34.9 % (ref 14.0–49.7)
MCH: 27.7 pg (ref 25.1–34.0)
MCHC: 32.5 g/dL (ref 31.5–36.0)
MCV: 85.3 fL (ref 79.5–101.0)
MONO#: 0.5 10*3/uL (ref 0.1–0.9)
MONO%: 6 % (ref 0.0–14.0)
NEUT#: 4.6 10*3/uL (ref 1.5–6.5)
NEUT%: 52.2 % (ref 38.4–76.8)
Platelets: 291 10*3/uL (ref 145–400)
RBC: 4.7 10*6/uL (ref 3.70–5.45)
RDW: 14.5 % (ref 11.2–14.5)
WBC: 8.8 10*3/uL (ref 3.9–10.3)

## 2017-05-25 LAB — COMPREHENSIVE METABOLIC PANEL
ALT: 11 U/L (ref 0–55)
ANION GAP: 9 meq/L (ref 3–11)
AST: 9 U/L (ref 5–34)
Albumin: 3.9 g/dL (ref 3.5–5.0)
Alkaline Phosphatase: 113 U/L (ref 40–150)
BILIRUBIN TOTAL: 0.23 mg/dL (ref 0.20–1.20)
BUN: 9.1 mg/dL (ref 7.0–26.0)
CO2: 25 meq/L (ref 22–29)
CREATININE: 0.8 mg/dL (ref 0.6–1.1)
Calcium: 9.3 mg/dL (ref 8.4–10.4)
Chloride: 107 mEq/L (ref 98–109)
GLUCOSE: 96 mg/dL (ref 70–140)
Potassium: 3.3 mEq/L — ABNORMAL LOW (ref 3.5–5.1)
SODIUM: 141 meq/L (ref 136–145)
TOTAL PROTEIN: 7.9 g/dL (ref 6.4–8.3)

## 2017-05-25 MED ORDER — GOSERELIN ACETATE 3.6 MG ~~LOC~~ IMPL
3.6000 mg | DRUG_IMPLANT | Freq: Once | SUBCUTANEOUS | Status: AC
  Administered 2017-05-25: 3.6 mg via SUBCUTANEOUS
  Filled 2017-05-25: qty 3.6

## 2017-05-25 MED ORDER — ALPRAZOLAM ER 1 MG PO TB24: 1.0000 mg | ORAL_TABLET | Freq: Every day | ORAL | 3 refills | Status: DC

## 2017-05-25 NOTE — Telephone Encounter (Signed)
This RN spoke with pt per her concerns with emotional status related to timing of Zoladex.  " it is like when it is getting close to the time of the shot - like may be a week or 10 days before I just get very emotionally unbalanced" " I am crying - then I find myself yelling at my kids "   Karen Hopkins states she feels more balanced and in control a few days after getting the shot.  She has noticed that if she has any delay in getting the injection symptoms become more intense.  She has not had a period " but it kinda feels like my body wants to maybe"  Karen Hopkins had her injection today.  She is wondering if there is another medical way to decrease her estrogen related to her history of breast cancer that would stabilize her emotions - if they are related to hormonal changes.  This RN informed pt above would be discussed with the MD - as well as resources given for support groups - Caran states she has a hard time participating in support groups due to trust issues.  This RN also discussed support through our " chaplain " Karen Hopkins and that this RN would like to make a referral for contact- which Karen Hopkins is agreeable to .  This message will be sent to MD for review of concerns as well as to Karen Hopkins for support resources.

## 2017-05-25 NOTE — Patient Instructions (Signed)
Goserelin injection What is this medicine? GOSERELIN (GOE se rel in) is similar to a hormone found in the body. It lowers the amount of sex hormones that the body makes. Men will have lower testosterone levels and women will have lower estrogen levels while taking this medicine. In men, this medicine is used to treat prostate cancer; the injection is either given once per month or once every 12 weeks. A once per month injection (only) is used to treat women with endometriosis, dysfunctional uterine bleeding, or advanced breast cancer. This medicine may be used for other purposes; ask your health care provider or pharmacist if you have questions. What should I tell my health care provider before I take this medicine? They need to know if you have any of these conditions (some only apply to women): -diabetes -heart disease or previous heart attack -high blood pressure -high cholesterol -kidney disease -osteoporosis or low bone density -problems passing urine -spinal cord injury -stroke -tobacco smoker -an unusual or allergic reaction to goserelin, hormone therapy, other medicines, foods, dyes, or preservatives -pregnant or trying to get pregnant -breast-feeding How should I use this medicine? This medicine is for injection under the skin. It is given by a health care professional in a hospital or clinic setting. Men receive this injection once every 4 weeks or once every 12 weeks. Women will only receive the once every 4 weeks injection. Talk to your pediatrician regarding the use of this medicine in children. Special care may be needed. Overdosage: If you think you have taken too much of this medicine contact a poison control center or emergency room at once. NOTE: This medicine is only for you. Do not share this medicine with others. What if I miss a dose? It is important not to miss your dose. Call your doctor or health care professional if you are unable to keep an appointment. What may  interact with this medicine? -female hormones like estrogen -herbal or dietary supplements like black cohosh, chasteberry, or DHEA -female hormones like testosterone -prasterone This list may not describe all possible interactions. Give your health care provider a list of all the medicines, herbs, non-prescription drugs, or dietary supplements you use. Also tell them if you smoke, drink alcohol, or use illegal drugs. Some items may interact with your medicine. What should I watch for while using this medicine? Visit your doctor or health care professional for regular checks on your progress. Your symptoms may appear to get worse during the first weeks of this therapy. Tell your doctor or healthcare professional if your symptoms do not start to get better or if they get worse after this time. Your bones may get weaker if you take this medicine for a long time. If you smoke or frequently drink alcohol you may increase your risk of bone loss. A family history of osteoporosis, chronic use of drugs for seizures (convulsions), or corticosteroids can also increase your risk of bone loss. Talk to your doctor about how to keep your bones strong. This medicine should stop regular monthly menstration in women. Tell your doctor if you continue to menstrate. Women should not become pregnant while taking this medicine or for 12 weeks after stopping this medicine. Women should inform their doctor if they wish to become pregnant or think they might be pregnant. There is a potential for serious side effects to an unborn child. Talk to your health care professional or pharmacist for more information. Do not breast-feed an infant while taking this medicine. Men should   inform their doctors if they wish to father a child. This medicine may lower sperm counts. Talk to your health care professional or pharmacist for more information. What side effects may I notice from receiving this medicine? Side effects that you should  report to your doctor or health care professional as soon as possible: -allergic reactions like skin rash, itching or hives, swelling of the face, lips, or tongue -bone pain -breathing problems -changes in vision -chest pain -feeling faint or lightheaded, falls -fever, chills -pain, swelling, warmth in the leg -pain, tingling, numbness in the hands or feet -signs and symptoms of low blood pressure like dizziness; feeling faint or lightheaded, falls; unusually weak or tired -stomach pain -swelling of the ankles, feet, hands -trouble passing urine or change in the amount of urine -unusually high or low blood pressure -unusually weak or tired Side effects that usually do not require medical attention (report to your doctor or health care professional if they continue or are bothersome): -change in sex drive or performance -changes in breast size in both males and females -changes in emotions or moods -headache -hot flashes -irritation at site where injected -loss of appetite -skin problems like acne, dry skin -vaginal dryness This list may not describe all possible side effects. Call your doctor for medical advice about side effects. You may report side effects to FDA at 1-800-FDA-1088. Where should I keep my medicine? This drug is given in a hospital or clinic and will not be stored at home. NOTE: This sheet is a summary. It may not cover all possible information. If you have questions about this medicine, talk to your doctor, pharmacist, or health care provider.    2016, Elsevier/Gold Standard. (2013-07-31 11:10:35)  

## 2017-06-01 ENCOUNTER — Ambulatory Visit: Payer: Medicare Other | Admitting: Physical Therapy

## 2017-06-01 DIAGNOSIS — M25511 Pain in right shoulder: Secondary | ICD-10-CM | POA: Diagnosis not present

## 2017-06-01 DIAGNOSIS — G8929 Other chronic pain: Secondary | ICD-10-CM | POA: Diagnosis not present

## 2017-06-01 DIAGNOSIS — I972 Postmastectomy lymphedema syndrome: Secondary | ICD-10-CM | POA: Diagnosis not present

## 2017-06-01 DIAGNOSIS — M542 Cervicalgia: Secondary | ICD-10-CM | POA: Diagnosis not present

## 2017-06-01 DIAGNOSIS — R293 Abnormal posture: Secondary | ICD-10-CM | POA: Diagnosis not present

## 2017-06-01 NOTE — Therapy (Signed)
[place], [place], [address] Phone: [phone]   Fax:  [phone]  Physical Therapy Evaluation  Patient Details  Name: Karen Hopkins MRN: [id number] Date of Birth: 1976/03/01 Referring Provider: Danella Sensing, NP   Encounter Date: 06/01/2017  PT End of Session - 06/01/17 1233    Visit Number  1    Number of Visits  12    Date for PT Re-Evaluation  07/13/17    PT Start Time  6659    PT Stop Time  1231    PT Time Calculation (min)  43 min    Activity Tolerance  Patient tolerated treatment well    Behavior During Therapy  [facility] for tasks assessed/performed       Past Medical History:  Diagnosis Date  . Anxiety   . Cancer ([place])   . Depression   . Essential hypertension 11/05/2015  . History of breast cancer 2011   right  . History of chemotherapy 2011  . History of radiation therapy 2011  . Hypertension    under control with med., has been on med. x 2 yr.  . Lymphedema of arm    right; no BP or puncture to right arm  . Personal history of chemotherapy 09/16/2009   rt breast  . Personal history of radiation therapy 05/2010   rt breast  . Pneumonia   . Seasonal allergies   . Sinus headache     Past Surgical History:  Procedure Laterality Date  . BREAST BIOPSY Right 08/26/2009  . BREAST REDUCTION SURGERY Left 08/23/2013   Procedure: LEFT BREAST REDUCTION  ;  Surgeon: Theodoro Kos, DO;  Location: [place];  Service: Plastics;  Laterality: Left;  . CESAREAN SECTION  07/15/2001; 03/18/2004; 11/21/2008  . LATISSIMUS FLAP TO BREAST Right 03/12/2013   Procedure: RIGHT BREAST LATISSIMUS FLAP WITH EXPANDER PLACEMENT;  Surgeon: Theodoro Kos, DO;  Location: [place];  Service: Plastics;  Laterality: Right;  . LIPOSUCTION Bilateral 08/23/2013   Procedure: LIPOSUCTION;  Surgeon: Theodoro Kos, DO;  Location: [place];  Service: Plastics;  Laterality: Bilateral;  . MASTECTOMY  Right 2011  . MODIFIED RADICAL MASTECTOMY Right 03/11/2010  . NASAL SEPTUM SURGERY    . PORT-A-CATH REMOVAL Left 12/01/2010  . PORTACATH PLACEMENT Left 09/12/2009  . REDUCTION MAMMAPLASTY Left 08/2013  . REMOVAL OF TISSUE EXPANDER AND PLACEMENT OF IMPLANT Right 08/23/2013   Procedure: REMOVAL RIGHT TISSUE EXPANDER AND PLACEMENT OF IMPLANT TO RIGHT BREAST ;  Surgeon: Theodoro Kos, DO;  Location: [place];  Service: Plastics;  Laterality: Right;  . SUPRA-UMBILICAL HERNIA  9357  . TUBAL LIGATION  11/21/2008    There were no vitals filed for this visit.   Subjective Assessment - 06/01/17 1151    Subjective  Pt returns to PT for chronic neck pain related to cervical dystonia.  Pt diagnosed with cervical dystonia in 0177; also complicated by Rt breast cancer with mastectomy in 2011 with RUE lymphedema.  Pt presents today for DN and exercises to help for pain.    Pertinent History  Right breast cancer with mastectomy 2011; reconstruction; left breast reduction; h/o right UE lymphedema and right upper quadrant pain, treated in this clinic several times before.    Patient Stated Goals  improve pain and motion of neck    Currently in Pain?  Yes    Pain Score  8     Pain Location  Neck    Pain Orientation  Right    Pain Descriptors / Indicators  Tightness;Shooting;Sharp    Pain Type  Chronic pain    Pain Onset  More than a month ago    Pain Frequency  Constant    Aggravating Factors   sitting up straight; movement    Pain Relieving Factors  botox, DN         [facility] PT Assessment - 06/01/17 1154      Assessment   Medical Diagnosis  cervical dystonia    Referring Provider  Danella Sensing, NP    Onset Date/Surgical Date  -- chronic    Hand Dominance  Right    Prior Therapy  yes at this clinic for same condition      Precautions   Precaution Comments  cancer precautions      Restrictions   Weight Bearing Restrictions  No      Balance Screen   Has the patient fallen in the  past 6 months  No    Has the patient had a decrease in activity level because of a fear of falling?   No    Is the patient reluctant to leave their home because of a fear of falling?   No      Home Environment   Living Environment  Private residence    Living Arrangements  Children 3 sons (60, 15, 48 y/o)    Type of Home  House    Home Access  Level entry    [place]  One level      Prior Function   Level of Independence  Independent    Vocation  On disability    Leisure  spend time with children; carpool children to games/practices (basketball, football, soccer)      Cognition   Overall Cognitive Status  Within Functional Limits for tasks assessed      Observation/Other Assessments   Focus on Therapeutic Outcomes (FOTO)   13 (87% limited; predicted 65% limited)      Posture/Postural Control   Posture/Postural Control  Postural limitations    Postural Limitations  Rounded Shoulders;Forward head;Increased thoracic kyphosis      ROM / Strength   AROM / PROM / Strength  AROM      AROM   Overall AROM Comments  heeps head in ~ 8 degrees Rt rotation    AROM Assessment Site  Cervical    Cervical Flexion  7    Cervical Extension  10    Cervical - Right Side Bend  10    Cervical - Left Side Bend  5    Cervical - Right Rotation  22    Cervical - Left Rotation  2      Strength   Overall Strength Comments  deferred at this time      Palpation   Palpation comment  trigger points noted in upper trap; rhomboids, levator scapula             Objective measurements completed on examination: See above findings.      [facility] Adult PT Treatment/Exercise - 06/01/17 1154      Manual Therapy   Manual Therapy  Soft tissue mobilization    Soft tissue mobilization  upper trap , levator and rhomboid Right       Trigger Point Dry Needling - 06/01/17 1256    Consent Given?  Yes    Education Handout Provided  Yes previously given during other episodes of care    Muscles Treated  Upper Body  Upper trapezius;Rhomboids;Levator scapulae    Upper Trapezius Response  Twitch reponse elicited;Palpable increased muscle length    Levator Scapulae Response  Twitch response elicited;Palpable increased muscle length    Rhomboids Response  Twitch response elicited;Palpable increased muscle length           PT Education - 06/01/17 1306    Education provided  Yes    Education Details  verbally reviewed DN precautions including lung field    Clune(s) Educated  Patient    Methods  Explanation    Comprehension  Verbalized understanding        Short Term Clinic Goals - 04/13/17 1356      CC Short Term Goal  #1   Title  Decrease circumference measurement at 10 cm proximal to olecranon on the right to 45 cm or less.    Baseline  45.6 on right compared to 42.8 on left at eval; 44.3 on 04/11/17    Status  Achieved      CC Short Term Goal  #2   Title  Decrease circumference measurement of abdomen at 10 cm. superior to upper tip of umbilicus to 161 cm. or less    Baseline  135.1 cm. at eval; 129 on 04/11/17    Status  Achieved       PT Long Term Goals - 06/01/17 1301      PT LONG TERM GOAL #1   Title  independent with HEP    Status  New    Target Date  07/13/17      PT LONG TERM GOAL #2   Title  improve cervical ROM by at least 10 degrees all planes for improved function    Status  New    Target Date  07/13/17      PT LONG TERM GOAL #3   Title  FOTO score improved to </= 75% limited for improved function    Status  New    Target Date  07/13/17      PT LONG TERM GOAL #4   Title  Report pain < 4/10 with activity for improved function    Status  New    Target Date  07/13/17      PT LONG TERM GOAL #5   Title  n/a        Long Term Clinic Goals - 05/11/17 1231      CC Long Term Goal  #1   Title  Decrease circumference at 10 cm. superior to right olecranon to 44.5 cm. or less    Baseline  45.6 on right compared to 42.8 on left at eval; 44.3 on 04/11/17; 43  cm-05/11/17    Status  Achieved      CC Long Term Goal  #2   Title  decrease circumference of abdomen at 10 cm. superior to upper tip of umbilicus to 096 cm. or less    Baseline  135.1 cm. at eval; 129 on 04/11/17; 127.9-11/27/18    Status  Achieved      CC Long Term Goal  #3   Title  Reduce Lymphedema Life Impact Scale to less than 60 percent impairment.    Baseline  87% at eval on 03/16/17; 65%-05/05/17    Status  On-going          Plan - 06/01/17 1257    Clinical Impression Statement  Pt is a 41 y/o female who presents to OPPT for Rt sided neck pain due to cervical dystonia.  Pt demonstrates significant ROM limitations, active  trigger points and postural abnormalities affecting functional mobility.  Pt will benefit from PT to address deficits listed.  Pt requesting DN today and reported decreased pain and tightness following session.  Pt currenlty active pt with cancer rehab but states she will be requesting d/c from them shortly.    History and Personal Factors relevant to plan of care:  chronic pain, hx breast cancer s/p mastectomy with lymphadema, multiple co-morbidities    Clinical Presentation  Evolving    Clinical Presentation due to:  limited motion and function of RUE    Clinical Decision Making  Moderate    Rehab Potential  Good    PT Frequency  2x / week    PT Duration  6 weeks    PT Treatment/Interventions  ADLs/Self Care Home Management;Cryotherapy;Electrical Stimulation;Moist Heat;Therapeutic exercise;Therapeutic activities;Ultrasound;Neuromuscular re-education;Patient/family education;Manual techniques;Taping;Dry needling;Passive range of motion    PT Next Visit Plan  assess response to DN and repeat PRN; needs posture/stretching HEP    Consulted and Agree with Plan of Care  Patient       Patient will benefit from skilled therapeutic intervention in order to improve the following deficits and impairments:  Pain, Increased fascial restricitons, Increased muscle spasms,  Decreased range of motion, Impaired flexibility, Postural dysfunction, Impaired UE functional use  Visit Diagnosis: Cervicalgia - Plan: PT plan of care cert/re-cert  Chronic right shoulder pain - Plan: PT plan of care cert/re-cert  Abnormal posture - Plan: PT plan of care cert/re-cert  G-Codes - 10/62/69 1304    Functional Assessment Tool Used (Outpatient Only)  FOTO    Functional Limitation  Carrying, moving and handling objects    Carrying, Moving and Handling Objects Current Status (S8546)  At least 80 percent but less than 100 percent impaired, limited or restricted    Carrying, Moving and Handling Objects Goal Status (E7035)  At least 80 percent but less than 100 percent impaired, limited or restricted    Carrying, Moving and Handling Objects Discharge Status [phone])  At least 80 percent but less than 100 percent impaired, limited or restricted        Problem List Patient Active Problem List   Diagnosis Date Noted  . Dyspnea 09/23/2016  . Eczema 08/31/2016  . Essential hypertension 11/05/2015  . Cervical dystonia 08/25/2015  . Headache 07/09/2015  . Myofascial muscle pain 04/29/2015  . Adhesive capsulitis of right shoulder 04/29/2015  . Type 2 diabetes mellitus without complication ([place]) 18/29/9371  . Lower extremity edema 10/01/2014  . Malignant neoplasm of overlapping sites of right breast in female, estrogen receptor positive ([place]) 05/24/2014  . Myalgia and myositis 05/17/2014  . Neoplasm related pain 03/12/2014  . CN (constipation) 11/26/2013  . H/O reduction mammoplasty 10/30/2013  . Seizure ([place]) 10/06/2013  . Status post bilateral breast implants 08/31/2013  . Acquired absence of breast and absent nipple 08/23/2013  . S/P breast reconstruction, right 08/23/2013  . AC joint arthropathy 07/02/2013  . Routine adult health maintenance 05/29/2013  . Hypokalemia 05/21/2013  . Chronic pain 04/19/2013  . Elevated blood sugar 03/09/2013  . Acute bronchitis 02/22/2013   . Lymphedema 05/24/2011  . RHINOSINUSITIS, CHRONIC 04/08/2010  . Obesity 08/04/2006  . DEPRESSIVE DISORDER, NOS 08/04/2006  . Allergic rhinitis 08/04/2006      Laureen Abrahams, PT, DPT 06/01/17 1:06 PM     [facility] [address] [place], [place], [address] Phone: [phone]   Fax:  [phone]  Name: Karen Hopkins MRN: [id number] Date of Birth:  06/08/1975  

## 2017-06-03 ENCOUNTER — Encounter: Payer: Self-pay | Admitting: General Practice

## 2017-06-03 NOTE — Progress Notes (Signed)
[place] Spiritual Care Note  Reached Tennie by phone per referral from Mateo Flow Dodd/RN for emotional support. Working to Cytogeneticist and trust for an ongoing supportive relationship.  Per pt, some days she can power through and be present without as much distress or distraction related to dx/tx, and some days are harder. She reports disturbances in appetite and sleep, esp through dx/tx and associated identity adjustments:  "Since breast cancer, I don't know me anymore." In addition to loss of sense of self, she reports grief related to self-image (changes in weight, skin, eyelashes, eyebrows--things that peers haven't encountered).  Toye is actively working on identifying her needs and saying no as appropriate (rather than fixing/overfunctioning for others). Although she hasn't "had a good laugh lately," she is involved with choir at church (and is investigating dance at church), which helps with fun, meaning-making, agency, and contribution.  Encouraged Breast Cancer and Young Women's Cancer Support Groups. Encouraged her to talk with the prescriber of her antidepressents/antianxiety meds about how she is feeling now and what might help bolster her coping during this additional stress. Normalized feelings, helped identify and support goals, reinforced strengths, provided emotional support. We plan to f/u by phone the week of 06/13/17.   [place], [place], [facility] Pager [phone] Voicemail [phone]

## 2017-06-09 ENCOUNTER — Ambulatory Visit: Payer: Medicare Other | Attending: Registered Nurse | Admitting: Physical Therapy

## 2017-06-09 ENCOUNTER — Encounter: Payer: Self-pay | Admitting: Physical Therapy

## 2017-06-09 DIAGNOSIS — R293 Abnormal posture: Secondary | ICD-10-CM | POA: Insufficient documentation

## 2017-06-09 DIAGNOSIS — M542 Cervicalgia: Secondary | ICD-10-CM | POA: Insufficient documentation

## 2017-06-09 DIAGNOSIS — M25511 Pain in right shoulder: Secondary | ICD-10-CM | POA: Diagnosis not present

## 2017-06-09 DIAGNOSIS — G8929 Other chronic pain: Secondary | ICD-10-CM | POA: Diagnosis not present

## 2017-06-09 NOTE — Patient Instructions (Signed)
Heat only above the clavicle!!!! Only due to breast cancer   , Cryotherapy anywhere below clavicle.     Flexibility: Corner Stretch    Standing in corner with hands just above shoulder level and feet __8__ inches from corner, lean forward until a comfortable stretch is felt across chest. Hold _20-30___ seconds. Repeat __3__ times per set. Do _1___ sets per session. Do __2-3__ sessions per day.  http://orth.exer.us/343   Copyright  VHI. All rights reserved.    Trigger Point Dry Needling  . What is Trigger Point Dry Needling (DN)? o DN is a physical therapy technique used to treat muscle pain and dysfunction. Specifically, DN helps deactivate muscle trigger points (muscle knots).  o A thin filiform needle is used to penetrate the skin and stimulate the underlying trigger point. The goal is for a local twitch response (LTR) to occur and for the trigger point to relax. No medication of any kind is injected during the procedure.   . What Does Trigger Point Dry Needling Feel Like?  o The procedure feels different for each individual patient. Some patients report that they do not actually feel the needle enter the skin and overall the process is not painful. Very mild bleeding may occur. However, many patients feel a deep cramping in the muscle in which the needle was inserted. This is the local twitch response.   Marland Kitchen How Will I feel after the treatment? o Soreness is normal, and the onset of soreness may not occur for a few hours. Typically this soreness does not last longer than two days.  o Bruising is uncommon, however; ice can be used to decrease any possible bruising.  o In rare cases feeling tired or nauseous after the treatment is normal. In addition, your symptoms may get worse before they get better, this period will typically not last longer than 24 hours.   . What Can I do After My Treatment? o Increase your hydration by drinking more water for the next 24 hours. o You may place  ice or heat on the areas treated that have become sore, however, do not use heat on inflamed or bruised areas. Heat often brings more relief post needling. o You can continue your regular activities, but vigorous activity is not recommended initially after the treatment for 24 hours. o DN is best combined with other physical therapy such as strengthening, stretching, and other therapies.  Voncille Lo, PT Certified Exercise Expert for the Aging Adult  06/09/17 9:03 AM Phone: [phone] Fax: [phone]

## 2017-06-09 NOTE — Therapy (Signed)
[place], [place], [address] Phone: [phone]   Fax:  [phone]  Physical Therapy Treatment  Patient Details  Name: Karen Hopkins MRN: [id number] Date of Birth: 1975-06-14 Referring Provider: Danella Sensing, NP   Encounter Date: 06/09/2017  PT End of Session - 06/09/17 0950    Visit Number  2    Number of Visits  12    Date for PT Re-Evaluation  07/13/17    PT Start Time  0850    PT Stop Time  0952    PT Time Calculation (min)  62 min    Activity Tolerance  Patient tolerated treatment well    Behavior During Therapy  [facility] for tasks assessed/performed       Past Medical History:  Diagnosis Date  . Anxiety   . Cancer ([place])   . Depression   . Essential hypertension 11/05/2015  . History of breast cancer 2011   right  . History of chemotherapy 2011  . History of radiation therapy 2011  . Hypertension    under control with med., has been on med. x 2 yr.  . Lymphedema of arm    right; no BP or puncture to right arm  . Personal history of chemotherapy 09/16/2009   rt breast  . Personal history of radiation therapy 05/2010   rt breast  . Pneumonia   . Seasonal allergies   . Sinus headache     Past Surgical History:  Procedure Laterality Date  . BREAST BIOPSY Right 08/26/2009  . BREAST REDUCTION SURGERY Left 08/23/2013   Procedure: LEFT BREAST REDUCTION  ;  Surgeon: Theodoro Kos, DO;  Location: [place];  Service: Plastics;  Laterality: Left;  . CESAREAN SECTION  07/15/2001; 03/18/2004; 11/21/2008  . LATISSIMUS FLAP TO BREAST Right 03/12/2013   Procedure: RIGHT BREAST LATISSIMUS FLAP WITH EXPANDER PLACEMENT;  Surgeon: Theodoro Kos, DO;  Location: [place];  Service: Plastics;  Laterality: Right;  . LIPOSUCTION Bilateral 08/23/2013   Procedure: LIPOSUCTION;  Surgeon: Theodoro Kos, DO;  Location: [place];  Service: Plastics;  Laterality: Bilateral;  . MASTECTOMY  Right 2011  . MODIFIED RADICAL MASTECTOMY Right 03/11/2010  . NASAL SEPTUM SURGERY    . PORT-A-CATH REMOVAL Left 12/01/2010  . PORTACATH PLACEMENT Left 09/12/2009  . REDUCTION MAMMAPLASTY Left 08/2013  . REMOVAL OF TISSUE EXPANDER AND PLACEMENT OF IMPLANT Right 08/23/2013   Procedure: REMOVAL RIGHT TISSUE EXPANDER AND PLACEMENT OF IMPLANT TO RIGHT BREAST ;  Surgeon: Theodoro Kos, DO;  Location: [place];  Service: Plastics;  Laterality: Right;  . SUPRA-UMBILICAL HERNIA  1517  . TUBAL LIGATION  11/21/2008    There were no vitals filed for this visit.  Subjective Assessment - 06/09/17 0852    Subjective  MOstly right side painful and radiates into R shoulder.  Last time, TPDN was very beneficial.  I want to do it again especially the front of my neck    Pertinent History  Right breast cancer with mastectomy 2011; reconstruction; left breast reduction; h/o right UE lymphedema and right upper quadrant pain, treated in this clinic several times before.    Patient Stated Goals  improve pain and motion of neck    Currently in Pain?  Yes    Pain Score  9     Pain Location  Neck    Pain Orientation  Right    Pain Type  Chronic pain    Pain Onset  More  than a month ago    Pain Frequency  Constant                      [facility] Adult PT Treatment/Exercise - 06/09/17 0001      Modalities   Modalities  Moist Heat      Moist Heat Therapy   Number Minutes Moist Heat  15 Minutes    Moist Heat Location  Cervical above clavicle      Manual Therapy   Manual Therapy  Soft tissue mobilization    Soft tissue mobilization  upper trap, levator, subclavius and scalenes, right only      Neck Exercises: Stretches   Upper Trapezius Stretch  2 reps;30 seconds    Upper Trapezius Stretch Limitations  right only    Levator Stretch  2 reps;30 seconds    Levator Stretch Limitations  right only    Neck Stretch  3 reps;20 seconds scalene    Neck Stretch Limitations  neck retraction,  deep neck flexor x 5    Other Neck Stretches  pectoral doorway stretch bil 3 x 30 sec pt very tender over subclavius and pecs on right       Trigger Point Dry Needling - 06/09/17 0955    Consent Given?  Yes    Education Handout Provided  Yes handout at 9:05 am    Muscles Treated Upper Body  Upper trapezius;Levator scapulae R only C-3 to C-5 erector, scalene a/m and subclavius    Upper Trapezius Response  Twitch reponse elicited;Palpable increased muscle length    Levator Scapulae Response  Twitch response elicited;Palpable increased muscle length           PT Education - 06/09/17 0920    Education provided  Yes    Education Details  gave written precautians and aftercare for TPDN, doorway stretch, and upper trap. levator, scalene and neck retraction    Darwin(s) Educated  Patient    Methods  Explanation;Demonstration;Tactile cues;Verbal cues;Handout    Comprehension  Verbalized understanding;Returned demonstration        Short Term Clinic Goals - 04/13/17 1356      CC Short Term Goal  #1   Title  Decrease circumference measurement at 10 cm proximal to olecranon on the right to 45 cm or less.    Baseline  45.6 on right compared to 42.8 on left at eval; 44.3 on 04/11/17    Status  Achieved      CC Short Term Goal  #2   Title  Decrease circumference measurement of abdomen at 10 cm. superior to upper tip of umbilicus to 829 cm. or less    Baseline  135.1 cm. at eval; 129 on 04/11/17    Status  Achieved       PT Long Term Goals - 06/01/17 1301      PT LONG TERM GOAL #1   Title  independent with HEP    Status  New    Target Date  07/13/17      PT LONG TERM GOAL #2   Title  improve cervical ROM by at least 10 degrees all planes for improved function    Status  New    Target Date  07/13/17      PT LONG TERM GOAL #3   Title  FOTO score improved to </= 75% limited for improved function    Status  New    Target Date  07/13/17      PT LONG TERM GOAL #  4   Title  Report  pain < 4/10 with activity for improved function    Status  New    Target Date  07/13/17      PT LONG TERM GOAL #5   Title  n/a        Long Term Clinic Goals - 05/11/17 1231      CC Long Term Goal  #1   Title  Decrease circumference at 10 cm. superior to right olecranon to 44.5 cm. or less    Baseline  45.6 on right compared to 42.8 on left at eval; 44.3 on 04/11/17; 43 cm-05/11/17    Status  Achieved      CC Long Term Goal  #2   Title  decrease circumference of abdomen at 10 cm. superior to upper tip of umbilicus to 034 cm. or less    Baseline  135.1 cm. at eval; 129 on 04/11/17; 127.9-11/27/18    Status  Achieved      CC Long Term Goal  #3   Title  Reduce Lymphedema Life Impact Scale to less than 60 percent impairment.    Baseline  87% at eval on 03/16/17; 65%-05/05/17    Status  On-going         Plan - 06/09/17 0952    Clinical Impression Statement  Pt is a former pt well known to this PT  Pt was reinforced on several neck stretches and TPDN after care and precautians.  Pt was closely monitored throughout session and also Faustino Congress present for some of the needling of scalenes and subclavius.  Pt was able to sleep after session and pain reduced in TPDN.  Pt responded well to TPDN and will continue toward completion of goals    Rehab Potential  Good    Clinical Impairments Affecting Rehab Potential  chronic pain issues    PT Frequency  2x / week    PT Duration  6 weeks    PT Treatment/Interventions  ADLs/Self Care Home Management;Cryotherapy;Electrical Stimulation;Moist Heat;Therapeutic exercise;Therapeutic activities;Ultrasound;Neuromuscular re-education;Patient/family education;Manual techniques;Taping;Dry needling;Passive range of motion    PT Next Visit Plan  assess response to DN and repeat PRN; needs posture/stretching HEP    Consulted and Agree with Plan of Care  Patient       Patient will benefit from skilled therapeutic intervention in order to improve the  following deficits and impairments:  Pain, Increased fascial restricitons, Increased muscle spasms, Decreased range of motion, Impaired flexibility, Postural dysfunction, Impaired UE functional use  Visit Diagnosis: Chronic right shoulder pain  Cervicalgia  Abnormal posture     Problem List Patient Active Problem List   Diagnosis Date Noted  . Dyspnea 09/23/2016  . Eczema 08/31/2016  . Essential hypertension 11/05/2015  . Cervical dystonia 08/25/2015  . Headache 07/09/2015  . Myofascial muscle pain 04/29/2015  . Adhesive capsulitis of right shoulder 04/29/2015  . Type 2 diabetes mellitus without complication ([place]) 74/25/9563  . Lower extremity edema 10/01/2014  . Malignant neoplasm of overlapping sites of right breast in female, estrogen receptor positive ([place]) 05/24/2014  . Myalgia and myositis 05/17/2014  . Neoplasm related pain 03/12/2014  . CN (constipation) 11/26/2013  . H/O reduction mammoplasty 10/30/2013  . Seizure ([place]) 10/06/2013  . Status post bilateral breast implants 08/31/2013  . Acquired absence of breast and absent nipple 08/23/2013  . S/P breast reconstruction, right 08/23/2013  . AC joint arthropathy 07/02/2013  . Routine adult health maintenance 05/29/2013  . Hypokalemia 05/21/2013  . Chronic pain 04/19/2013  .  Elevated blood sugar 03/09/2013  . Acute bronchitis 02/22/2013  . Lymphedema 05/24/2011  . RHINOSINUSITIS, CHRONIC 04/08/2010  . Obesity 08/04/2006  . DEPRESSIVE DISORDER, NOS 08/04/2006  . Allergic rhinitis 08/04/2006    Voncille Lo, PT Certified Exercise Expert for the Aging Adult  06/09/17 9:59 AM Phone: [phone] Fax: [phone]      Copyright  VHI. All rights reserved.  [place], [place], [address] Phone: [phone]   Fax:  [phone]  Name: Shereta Crothers Schuneman MRN: [id number] Date of Birth: 1976/02/23

## 2017-06-13 ENCOUNTER — Encounter: Payer: Self-pay | Admitting: General Practice

## 2017-06-13 ENCOUNTER — Encounter: Payer: Medicare Other | Attending: Registered Nurse | Admitting: Registered Nurse

## 2017-06-13 ENCOUNTER — Encounter: Payer: Self-pay | Admitting: Registered Nurse

## 2017-06-13 VITALS — BP 114/80 | HR 100

## 2017-06-13 DIAGNOSIS — G893 Neoplasm related pain (acute) (chronic): Secondary | ICD-10-CM

## 2017-06-13 DIAGNOSIS — Z5181 Encounter for therapeutic drug level monitoring: Secondary | ICD-10-CM

## 2017-06-13 DIAGNOSIS — G243 Spasmodic torticollis: Secondary | ICD-10-CM

## 2017-06-13 DIAGNOSIS — Z79899 Other long term (current) drug therapy: Secondary | ICD-10-CM | POA: Diagnosis not present

## 2017-06-13 DIAGNOSIS — M545 Low back pain, unspecified: Secondary | ICD-10-CM

## 2017-06-13 DIAGNOSIS — G894 Chronic pain syndrome: Secondary | ICD-10-CM | POA: Diagnosis not present

## 2017-06-13 DIAGNOSIS — M542 Cervicalgia: Secondary | ICD-10-CM

## 2017-06-13 DIAGNOSIS — I890 Lymphedema, not elsewhere classified: Secondary | ICD-10-CM | POA: Insufficient documentation

## 2017-06-13 DIAGNOSIS — M546 Pain in thoracic spine: Secondary | ICD-10-CM | POA: Insufficient documentation

## 2017-06-13 MED ORDER — OXYCODONE HCL 5 MG PO TABS: 5.0000 mg | ORAL_TABLET | Freq: Four times a day (QID) | ORAL | 0 refills | Status: DC | PRN

## 2017-06-13 NOTE — Progress Notes (Signed)
Subjective:    Patient ID: Karen Hopkins, female    DOB: [birth year], 42 y.o.   MRN: [id number]  HPI: Karen Hopkins is a 42year old female who returns for follow up appointmentfor chronic pain and medication refill. She states her pain is located in her neck mainly right side radiating into her right shoulder and lower back pain. Also report the Dry Needling has helped her pain. She rates her pain 7.  Her current exercise regime is walking and performing stretching exercises.  Karen Hopkins Morphine equivalent is 30.00MME.  She is also prescribed Alprazolam by Dr.Gustav .We have reviewed the black box warning of using opioids and benzodiazepines.I highlighted the dangers of using these drugs together and discussed the adverse events including respiratory suppression, overdose, cognitive impairment and importance of  compliance with current regimen. She verbalizes understanding, we will continue to monitor and adjust as indicated.    Karen Hopkins last UDS was performed on 10/04/2016, it was consistent.   Pain Inventory Average Pain 8 Pain Right Now 7 My pain is intermittent, constant, sharp, burning, dull, stabbing, tingling and aching  In the last 24 hours, has pain interfered with the following? General activity 4 Relation with others 6 Enjoyment of life 5 What TIME of day is your pain at its worst? all Sleep (in general) Poor  Pain is worse with: walking, bending, sitting, inactivity, standing and some activites Pain improves with: therapy/exercise, medication, TENS and injections Relief from Meds: 8  Mobility walk without assistance  Function disabled: date disabled .  Neuro/Psych bladder control problems trouble walking spasms depression anxiety  Prior Studies Any changes since last visit?  no  Physicians involved in your care Any changes since last visit?  no   Family History  Problem Relation Age of Onset  . Hypertension Mother   . Diabetes type II Father    . Prostate cancer Father   . Stroke Neg Hx    Social History   Socioeconomic History  . Marital status: Single    Spouse name: None  . Number of children: 3  . Years of education: 77  . Highest education level: None  Social Needs  . Financial resource strain: None  . Food insecurity - worry: None  . Food insecurity - inability: None  . Transportation needs - medical: None  . Transportation needs - non-medical: None  Occupational History  . Occupation: Disability   Tobacco Use  . Smoking status: Former Smoker    Packs/day: 0.25    Years: 5.00    Pack years: 1.25    Types: Cigarettes    Start date: 06/07/2002    Last attempt to quit: 04/26/2010    Years since quitting: 7.1  . Smokeless tobacco: Never Used  Substance and Sexual Activity  . Alcohol use: No  . Drug use: No  . Sexual activity: Not Currently    Birth control/protection: Surgical  Other Topics Concern  . None  Social History Narrative   Lives with kids   Caffeine use: none    Past Surgical History:  Procedure Laterality Date  . BREAST BIOPSY Right 08/26/2009  . BREAST REDUCTION SURGERY Left 08/23/2013   Procedure: LEFT BREAST REDUCTION  ;  Surgeon: Theodoro Kos, DO;  Location: [place];  Service: Plastics;  Laterality: Left;  . CESAREAN SECTION  07/15/2001; 03/18/2004; 11/21/2008  . LATISSIMUS FLAP TO BREAST Right 03/12/2013   Procedure: RIGHT BREAST LATISSIMUS FLAP WITH EXPANDER PLACEMENT;  Surgeon: Theodoro Kos,  DO;  Location: [place];  Service: Plastics;  Laterality: Right;  . LIPOSUCTION Bilateral 08/23/2013   Procedure: LIPOSUCTION;  Surgeon: Theodoro Kos, DO;  Location: [place];  Service: Plastics;  Laterality: Bilateral;  . MASTECTOMY Right 2011  . MODIFIED RADICAL MASTECTOMY Right 03/11/2010  . NASAL SEPTUM SURGERY    . PORT-A-CATH REMOVAL Left 12/01/2010  . PORTACATH PLACEMENT Left 09/12/2009  . REDUCTION MAMMAPLASTY Left 08/2013  . REMOVAL OF TISSUE EXPANDER AND  PLACEMENT OF IMPLANT Right 08/23/2013   Procedure: REMOVAL RIGHT TISSUE EXPANDER AND PLACEMENT OF IMPLANT TO RIGHT BREAST ;  Surgeon: Theodoro Kos, DO;  Location: [place];  Service: Plastics;  Laterality: Right;  . SUPRA-UMBILICAL HERNIA  7782  . TUBAL LIGATION  11/21/2008   Past Medical History:  Diagnosis Date  . Anxiety   . Cancer ([place])   . Depression   . Essential hypertension 11/05/2015  . History of breast cancer 2011   right  . History of chemotherapy 2011  . History of radiation therapy 2011  . Hypertension    under control with med., has been on med. x 2 yr.  . Lymphedema of arm    right; no BP or puncture to right arm  . Personal history of chemotherapy 09/16/2009   rt breast  . Personal history of radiation therapy 05/2010   rt breast  . Pneumonia   . Seasonal allergies   . Sinus headache    BP 114/80   Pulse 100   LMP 07/22/2014 (Approximate)   SpO2 98%   Opioid Risk Score:   Fall Risk Score:  `1  Depression screen PHQ 2/9  Depression screen [facility] 2/9 08/31/2016 07/22/2016 07/20/2016 07/08/2016 08/04/2015 06/25/2015  Decreased Interest 0 0 0 0 0 0  Down, Depressed, Hopeless 0 0 0 0 0 0  PHQ - 2 Score 0 0 0 0 0 0  Some recent data might be hidden     Review of Systems  Constitutional: Negative.   HENT: Negative.   Eyes: Negative.   Respiratory: Positive for cough.   Cardiovascular: Negative.   Gastrointestinal: Positive for constipation.  Endocrine: Negative.   Genitourinary: Positive for frequency and urgency.  Musculoskeletal: Positive for arthralgias, back pain, gait problem, neck pain and neck stiffness.  Skin: Negative.   Allergic/Immunologic: Negative.   Hematological: Negative.   Psychiatric/Behavioral: Negative.   All other systems reviewed and are negative.      Objective:   Physical Exam  Constitutional: She is oriented to [place], place, and time. She appears well-developed and well-nourished.  HENT:  Head: Normocephalic  and atraumatic.  Neck: Normal range of motion. Neck supple.  Cervical Paraspinal Tenderness: C-5-C-6: Mainly Right Side  Cardiovascular: Normal rate and regular rhythm.  Pulmonary/Chest: Effort normal and breath sounds normal.  Musculoskeletal:  Normal Muscle Bulk and Muscle Testing Reveals: Upper Extremities: Left: Full ROM and Muscle Strength 5/5 Right: Decreased ROM 45 Degrees and Muscle Strength 3/5 Thoracic Paraspinal Tenderness: T-1-T-6 Mainly Right Side Lumbar Paraspinal Tenderness: L-3-L-5 Lower Extremities: Full ROM and Muscle Strength 5/5 Arises from Table with ease Narrow Based Gait  Neurological: She is alert and oriented to [place], place, and time.  Skin: Skin is warm and dry.  Psychiatric: She has a normal mood and affect.  Nursing note and vitals reviewed.         Assessment & Plan:  1.Myofascial pain syndrome chronic postoperative, as well as post radiation: S/P Breast Reconstruction Surgery x 2. Continue: Physical Therapy (  Dry Needling)  06/13/2017 Refilled: Oxycodone 5 mg one tablet every 6 hours as needed for severe pain  #120.Continue HEP. We will continue the opioid monitoring program, this consists of regular clinic visits, examinations, urine drug screen, pill counts as well as use of [place] Controlled Substance Reporting System. 2. Neoplasm Related Pain: Continue Current Medication Regime. 06/13/2017 3. Lymphedema of Right Upper Extremity: Oncology Following. 06/13/2017 4. Cervical Dystonia: Schedule Botox Injection with Dr. Letta Pate. 06/13/2017 5. Obesity: Continue with Healthy Diet and Exercise Regime. 06/13/2017 6. Muscle Spasms: Continue Tizanidine. 06/13/2017 7. Depression: Continue Cymbalta and Counseling at [facility]. 06/13/2017 8.Chronic Bilateral Knee Pai/ OA: Continue Voltaren Jel. 06/13/2017  20 minutes of face to face patient care time was spent during this visit. All questions were encouraged and answered.   F/U in 1  month

## 2017-06-13 NOTE — Progress Notes (Signed)
[facility] Spiritual Care Note  Reached Karen Hopkins briefly by phone. She plans to call back when not in car.   [place], [place], [facility] Pager [phone] Voicemail [phone]

## 2017-06-14 ENCOUNTER — Ambulatory Visit: Payer: Medicare Other

## 2017-06-15 ENCOUNTER — Telehealth: Payer: Self-pay | Admitting: Cardiovascular Disease

## 2017-06-15 MED ORDER — CARVEDILOL 6.25 MG PO TABS: ORAL_TABLET | ORAL | 0 refills | Status: DC

## 2017-06-15 MED ORDER — FUROSEMIDE 40 MG PO TABS: 40.0000 mg | ORAL_TABLET | Freq: Every day | ORAL | 0 refills | Status: DC

## 2017-06-15 NOTE — Telephone Encounter (Signed)
New Message   *STAT* If patient is at the pharmacy, call can be transferred to refill team.   1. Which medications need to be refilled? (please list name of each medication and dose if known) Carvedilol and furosemide   2. Which pharmacy/location (including street and city if local pharmacy) is medication to be sent to? 6.25mg  and 40mg    3. Do they need a 30 day or 90 day supply? [place]

## 2017-06-15 NOTE — Telephone Encounter (Signed)
Returned call to patient requesting refills for coreg and furosemide.Refills sent to pharmacy.Advised to keep appointment as planned with Dr.Brodhead.

## 2017-06-16 ENCOUNTER — Encounter: Payer: Self-pay | Admitting: Physical Therapy

## 2017-06-16 ENCOUNTER — Ambulatory Visit: Payer: Medicare Other | Admitting: Physical Therapy

## 2017-06-16 DIAGNOSIS — M542 Cervicalgia: Secondary | ICD-10-CM

## 2017-06-16 DIAGNOSIS — R293 Abnormal posture: Secondary | ICD-10-CM | POA: Diagnosis not present

## 2017-06-16 DIAGNOSIS — M25511 Pain in right shoulder: Principal | ICD-10-CM

## 2017-06-16 DIAGNOSIS — G8929 Other chronic pain: Secondary | ICD-10-CM

## 2017-06-16 NOTE — Therapy (Signed)
[place], [place], [address] Phone: [phone]   Fax:  [phone]  Physical Therapy Treatment  Patient Details  Name: Karen Hopkins MRN: [id number] Date of Birth: April 24, 1976 Referring Provider: Danella Sensing, NP   Encounter Date: 06/16/2017  PT End of Session - 06/16/17 0945    Visit Number  3    Number of Visits  12    Date for PT Re-Evaluation  07/13/17    PT Start Time  0945 pt arrived late    PT Stop Time  1023    PT Time Calculation (min)  38 min    Activity Tolerance  Patient tolerated treatment well    Behavior During Therapy  [facility] for tasks assessed/performed       Past Medical History:  Diagnosis Date  . Anxiety   . Cancer ([place])   . Depression   . Essential hypertension 11/05/2015  . History of breast cancer 2011   right  . History of chemotherapy 2011  . History of radiation therapy 2011  . Hypertension    under control with med., has been on med. x 2 yr.  . Lymphedema of arm    right; no BP or puncture to right arm  . Personal history of chemotherapy 09/16/2009   rt breast  . Personal history of radiation therapy 05/2010   rt breast  . Pneumonia   . Seasonal allergies   . Sinus headache     Past Surgical History:  Procedure Laterality Date  . BREAST BIOPSY Right 08/26/2009  . BREAST REDUCTION SURGERY Left 08/23/2013   Procedure: LEFT BREAST REDUCTION  ;  Surgeon: Theodoro Kos, DO;  Location: [place];  Service: Plastics;  Laterality: Left;  . CESAREAN SECTION  07/15/2001; 03/18/2004; 11/21/2008  . LATISSIMUS FLAP TO BREAST Right 03/12/2013   Procedure: RIGHT BREAST LATISSIMUS FLAP WITH EXPANDER PLACEMENT;  Surgeon: Theodoro Kos, DO;  Location: [place];  Service: Plastics;  Laterality: Right;  . LIPOSUCTION Bilateral 08/23/2013   Procedure: LIPOSUCTION;  Surgeon: Theodoro Kos, DO;  Location: [place];  Service: Plastics;  Laterality: Bilateral;   . MASTECTOMY Right 2011  . MODIFIED RADICAL MASTECTOMY Right 03/11/2010  . NASAL SEPTUM SURGERY    . PORT-A-CATH REMOVAL Left 12/01/2010  . PORTACATH PLACEMENT Left 09/12/2009  . REDUCTION MAMMAPLASTY Left 08/2013  . REMOVAL OF TISSUE EXPANDER AND PLACEMENT OF IMPLANT Right 08/23/2013   Procedure: REMOVAL RIGHT TISSUE EXPANDER AND PLACEMENT OF IMPLANT TO RIGHT BREAST ;  Surgeon: Theodoro Kos, DO;  Location: [place];  Service: Plastics;  Laterality: Right;  . SUPRA-UMBILICAL HERNIA  [id number]  . TUBAL LIGATION  11/21/2008    There were no vitals filed for this visit.  Subjective Assessment - 06/16/17 0945    Subjective  Neck is still tight along the Rt side of her neck. Reports typical soreness after needling. Reports pain is still about 8/10 but some of the soreness is out and I can move a little better. I am trying to exercise but they are hard to do due to pain.   (Pended)     Patient Stated Goals  improve pain and motion of neck  (Pended)     Currently in Pain?  Yes  (Pended)     Pain Score  8   (Pended)     Pain Location  Neck  (Pended)     Pain Orientation  Right  (Pended)  [place] Adult PT Treatment/Exercise - 06/16/17 0001      Moist Heat Therapy   Number Minutes Moist Heat  10 Minutes    Moist Heat Location  Cervical      Manual Therapy   Manual therapy comments  skilled palpation and monitoring during TPDN    Soft tissue mobilization  Rt upper trap & scalanes       Trigger Point Dry Needling - 06/16/17 1013    Muscles Treated Upper Body  -- scalenes Rt    Upper Trapezius Response  Twitch reponse elicited;Palpable increased muscle length           PT Education - 06/16/17 1014    Education provided  Yes    Education Details  TPDN, importance of posture & exercises to maintain gains.     [place](s) Educated  Patient    Methods  Explanation    Comprehension  Verbalized understanding;Need further instruction         Short Term Clinic Goals - 04/13/17 1356      CC Short Term Goal  #1   Title  Decrease circumference measurement at 10 cm proximal to olecranon on the right to 45 cm or less.    Baseline  45.6 on right compared to 42.8 on left at eval; 44.3 on 04/11/17    Status  Achieved      CC Short Term Goal  #2   Title  Decrease circumference measurement of abdomen at 10 cm. superior to upper tip of umbilicus to 644 cm. or less    Baseline  135.1 cm. at eval; 129 on 04/11/17    Status  Achieved       PT Long Term Goals - 06/01/17 1301      PT LONG TERM GOAL #1   Title  independent with HEP    Status  New    Target Date  07/13/17      PT LONG TERM GOAL #2   Title  improve cervical ROM by at least 10 degrees all planes for improved function    Status  New    Target Date  07/13/17      PT LONG TERM GOAL #3   Title  FOTO score improved to </= 75% limited for improved function    Status  New    Target Date  07/13/17      PT LONG TERM GOAL #4   Title  Report pain < 4/10 with activity for improved function    Status  New    Target Date  07/13/17      PT LONG TERM GOAL #5   Title  n/a        Long Term Clinic Goals - 05/11/17 1231      CC Long Term Goal  #1   Title  Decrease circumference at 10 cm. superior to right olecranon to 44.5 cm. or less    Baseline  45.6 on right compared to 42.8 on left at eval; 44.3 on 04/11/17; 43 cm-05/11/17    Status  Achieved      CC Long Term Goal  #2   Title  decrease circumference of abdomen at 10 cm. superior to upper tip of umbilicus to 034 cm. or less    Baseline  135.1 cm. at eval; 129 on 04/11/17; 127.9-11/27/18    Status  Achieved      CC Long Term Goal  #3   Title  Reduce Lymphedema Life Impact Scale to less than 60 percent  impairment.    Baseline  87% at eval on 03/16/17; 65%-05/05/17    Status  On-going         Plan - 06/16/17 1205    Clinical Impression Statement  Pt arrived late for her session today and requested DN. Pt is  very resistant to exercise/changes in posture to reported pain in neck. Pt reported upper trap felt "much better" after DN today/     PT Treatment/Interventions  ADLs/Self Care Home Management;Cryotherapy;Electrical Stimulation;Moist Heat;Therapeutic exercise;Therapeutic activities;Ultrasound;Neuromuscular re-education;Patient/family education;Manual techniques;Taping;Dry needling;Passive range of motion    PT Next Visit Plan  assess response to DN and repeat PRN; needs posture/stretching HEP    Consulted and Agree with Plan of Care  Patient       Patient will benefit from skilled therapeutic intervention in order to improve the following deficits and impairments:  Pain, Increased fascial restricitons, Increased muscle spasms, Decreased range of motion, Impaired flexibility, Postural dysfunction, Impaired UE functional use  Visit Diagnosis: Chronic right shoulder pain  Cervicalgia  Abnormal posture     Problem List Patient Active Problem List   Diagnosis Date Noted  . Dyspnea 09/23/2016  . Eczema 08/31/2016  . Essential hypertension 11/05/2015  . Cervical dystonia 08/25/2015  . Headache 07/09/2015  . Myofascial muscle pain 04/29/2015  . Adhesive capsulitis of right shoulder 04/29/2015  . Type 2 diabetes mellitus without complication ([place]) 83/15/1761  . Lower extremity edema 10/01/2014  . Malignant neoplasm of overlapping sites of right breast in female, estrogen receptor positive ([place]) 05/24/2014  . Myalgia and myositis 05/17/2014  . Neoplasm related pain 03/12/2014  . CN (constipation) 11/26/2013  . H/O reduction mammoplasty 10/30/2013  . Seizure ([place]) 10/06/2013  . Status post bilateral breast implants 08/31/2013  . Acquired absence of breast and absent nipple 08/23/2013  . S/P breast reconstruction, right 08/23/2013  . AC joint arthropathy 07/02/2013  . Routine adult health maintenance 05/29/2013  . Hypokalemia 05/21/2013  . Chronic pain 04/19/2013  . Elevated blood  sugar 03/09/2013  . Acute bronchitis 02/22/2013  . Lymphedema 05/24/2011  . RHINOSINUSITIS, CHRONIC 04/08/2010  . Obesity 08/04/2006  . DEPRESSIVE DISORDER, NOS 08/04/2006  . Allergic rhinitis 08/04/2006   Odyssey Vasbinder C. Ved Martos PT, DPT 06/16/17 12:08 PM   [facility] [address] [place], [place], [address] Phone: [phone]   Fax:  [phone]  Name: Karen Hopkins MRN: [id number] Date of Birth: [birth year]

## 2017-06-21 ENCOUNTER — Inpatient Hospital Stay (HOSPITAL_BASED_OUTPATIENT_CLINIC_OR_DEPARTMENT_OTHER): Payer: Medicare Other | Admitting: Adult Health

## 2017-06-21 ENCOUNTER — Inpatient Hospital Stay: Payer: Medicare Other | Attending: Oncology

## 2017-06-21 ENCOUNTER — Inpatient Hospital Stay: Payer: Medicare Other

## 2017-06-21 ENCOUNTER — Telehealth: Payer: Self-pay | Admitting: Adult Health

## 2017-06-21 VITALS — BP 120/87 | HR 96 | Temp 97.7°F | Resp 18 | Ht 64.0 in | Wt 236.2 lb

## 2017-06-21 DIAGNOSIS — R32 Unspecified urinary incontinence: Secondary | ICD-10-CM | POA: Diagnosis not present

## 2017-06-21 DIAGNOSIS — Z87891 Personal history of nicotine dependence: Secondary | ICD-10-CM

## 2017-06-21 DIAGNOSIS — R635 Abnormal weight gain: Secondary | ICD-10-CM | POA: Diagnosis not present

## 2017-06-21 DIAGNOSIS — F419 Anxiety disorder, unspecified: Secondary | ICD-10-CM | POA: Insufficient documentation

## 2017-06-21 DIAGNOSIS — Z7984 Long term (current) use of oral hypoglycemic drugs: Secondary | ICD-10-CM | POA: Insufficient documentation

## 2017-06-21 DIAGNOSIS — C50911 Malignant neoplasm of unspecified site of right female breast: Secondary | ICD-10-CM

## 2017-06-21 DIAGNOSIS — Z8041 Family history of malignant neoplasm of ovary: Secondary | ICD-10-CM

## 2017-06-21 DIAGNOSIS — I10 Essential (primary) hypertension: Secondary | ICD-10-CM | POA: Insufficient documentation

## 2017-06-21 DIAGNOSIS — Z9011 Acquired absence of right breast and nipple: Secondary | ICD-10-CM

## 2017-06-21 DIAGNOSIS — C50411 Malignant neoplasm of upper-outer quadrant of right female breast: Secondary | ICD-10-CM

## 2017-06-21 DIAGNOSIS — Z170 Estrogen receptor positive status [ER+]: Secondary | ICD-10-CM

## 2017-06-21 DIAGNOSIS — Z8701 Personal history of pneumonia (recurrent): Secondary | ICD-10-CM

## 2017-06-21 DIAGNOSIS — Z79818 Long term (current) use of other agents affecting estrogen receptors and estrogen levels: Secondary | ICD-10-CM | POA: Diagnosis not present

## 2017-06-21 DIAGNOSIS — C50811 Malignant neoplasm of overlapping sites of right female breast: Secondary | ICD-10-CM

## 2017-06-21 DIAGNOSIS — Z79811 Long term (current) use of aromatase inhibitors: Secondary | ICD-10-CM | POA: Diagnosis not present

## 2017-06-21 DIAGNOSIS — M542 Cervicalgia: Secondary | ICD-10-CM | POA: Insufficient documentation

## 2017-06-21 DIAGNOSIS — Z923 Personal history of irradiation: Secondary | ICD-10-CM

## 2017-06-21 DIAGNOSIS — Z9221 Personal history of antineoplastic chemotherapy: Secondary | ICD-10-CM | POA: Insufficient documentation

## 2017-06-21 DIAGNOSIS — F329 Major depressive disorder, single episode, unspecified: Secondary | ICD-10-CM | POA: Diagnosis not present

## 2017-06-21 DIAGNOSIS — Z8042 Family history of malignant neoplasm of prostate: Secondary | ICD-10-CM | POA: Insufficient documentation

## 2017-06-21 DIAGNOSIS — M899 Disorder of bone, unspecified: Secondary | ICD-10-CM | POA: Insufficient documentation

## 2017-06-21 DIAGNOSIS — Z79899 Other long term (current) drug therapy: Secondary | ICD-10-CM

## 2017-06-21 DIAGNOSIS — R232 Flushing: Secondary | ICD-10-CM | POA: Diagnosis not present

## 2017-06-21 LAB — CBC WITH DIFFERENTIAL/PLATELET
Basophils Absolute: 0 K/uL (ref 0.0–0.1)
Basophils Relative: 0 %
Eosinophils Absolute: 0.7 K/uL — ABNORMAL HIGH (ref 0.0–0.5)
Eosinophils Relative: 7 %
HCT: 39.1 % (ref 34.8–46.6)
Hemoglobin: 12.6 g/dL (ref 11.6–15.9)
Lymphocytes Relative: 37 %
Lymphs Abs: 3.5 K/uL — ABNORMAL HIGH (ref 0.9–3.3)
MCH: 28 pg (ref 25.1–34.0)
MCHC: 32.2 g/dL (ref 31.5–36.0)
MCV: 86.9 fL (ref 79.5–101.0)
Monocytes Absolute: 0.4 K/uL (ref 0.1–0.9)
Monocytes Relative: 4 %
Neutro Abs: 4.9 K/uL (ref 1.5–6.5)
Neutrophils Relative %: 52 %
Platelets: 286 K/uL (ref 145–400)
RBC: 4.5 MIL/uL (ref 3.70–5.45)
RDW: 14.4 % (ref 11.2–16.1)
WBC: 9.5 K/uL (ref 3.9–10.3)

## 2017-06-21 LAB — COMPREHENSIVE METABOLIC PANEL
ALBUMIN: 3.7 g/dL (ref 3.5–5.0)
ALT: 14 U/L (ref 0–55)
ANION GAP: 8 (ref 3–11)
AST: 13 U/L (ref 5–34)
Alkaline Phosphatase: 115 U/L (ref 40–150)
BILIRUBIN TOTAL: 0.2 mg/dL (ref 0.2–1.2)
BUN: 9 mg/dL (ref 7–26)
CHLORIDE: 109 mmol/L (ref 98–109)
CO2: 25 mmol/L (ref 22–29)
Calcium: 9.3 mg/dL (ref 8.4–10.4)
Creatinine, Ser: 0.82 mg/dL (ref 0.60–1.10)
GFR calc Af Amer: >60 mL/min (ref >60)
GLUCOSE: 99 mg/dL (ref 70–140)
POTASSIUM: 3.5 mmol/L (ref 3.3–4.7)
Sodium: 142 mmol/L (ref 136–145)
TOTAL PROTEIN: 7.6 g/dL (ref 6.4–8.3)

## 2017-06-21 MED ORDER — GOSERELIN ACETATE 3.6 MG ~~LOC~~ IMPL
3.6000 mg | DRUG_IMPLANT | Freq: Once | SUBCUTANEOUS | Status: AC
  Administered 2017-06-21: 3.6 mg via SUBCUTANEOUS
  Filled 2017-06-21: qty 3.6

## 2017-06-21 NOTE — Patient Instructions (Signed)
Goserelin injection What is this medicine? GOSERELIN (GOE se rel in) is similar to a hormone found in the body. It lowers the amount of sex hormones that the body makes. Men will have lower testosterone levels and women will have lower estrogen levels while taking this medicine. In men, this medicine is used to treat prostate cancer; the injection is either given once per month or once every 12 weeks. A once per month injection (only) is used to treat women with endometriosis, dysfunctional uterine bleeding, or advanced breast cancer. This medicine may be used for other purposes; ask your health care provider or pharmacist if you have questions. What should I tell my health care provider before I take this medicine? They need to know if you have any of these conditions (some only apply to women): -diabetes -heart disease or previous heart attack -high blood pressure -high cholesterol -kidney disease -osteoporosis or low bone density -problems passing urine -spinal cord injury -stroke -tobacco smoker -an unusual or allergic reaction to goserelin, hormone therapy, other medicines, foods, dyes, or preservatives -pregnant or trying to get pregnant -breast-feeding How should I use this medicine? This medicine is for injection under the skin. It is given by a health care professional in a hospital or clinic setting. Men receive this injection once every 4 weeks or once every 12 weeks. Women will only receive the once every 4 weeks injection. Talk to your pediatrician regarding the use of this medicine in children. Special care may be needed. Overdosage: If you think you have taken too much of this medicine contact a poison control center or emergency room at once. NOTE: This medicine is only for you. Do not share this medicine with others. What if I miss a dose? It is important not to miss your dose. Call your doctor or health care professional if you are unable to keep an appointment. What may  interact with this medicine? -female hormones like estrogen -herbal or dietary supplements like black cohosh, chasteberry, or DHEA -female hormones like testosterone -prasterone This list may not describe all possible interactions. Give your health care provider a list of all the medicines, herbs, non-prescription drugs, or dietary supplements you use. Also tell them if you smoke, drink alcohol, or use illegal drugs. Some items may interact with your medicine. What should I watch for while using this medicine? Visit your doctor or health care professional for regular checks on your progress. Your symptoms may appear to get worse during the first weeks of this therapy. Tell your doctor or healthcare professional if your symptoms do not start to get better or if they get worse after this time. Your bones may get weaker if you take this medicine for a long time. If you smoke or frequently drink alcohol you may increase your risk of bone loss. A family history of osteoporosis, chronic use of drugs for seizures (convulsions), or corticosteroids can also increase your risk of bone loss. Talk to your doctor about how to keep your bones strong. This medicine should stop regular monthly menstration in women. Tell your doctor if you continue to menstrate. Women should not become pregnant while taking this medicine or for 12 weeks after stopping this medicine. Women should inform their doctor if they wish to become pregnant or think they might be pregnant. There is a potential for serious side effects to an unborn child. Talk to your health care professional or pharmacist for more information. Do not breast-feed an infant while taking this medicine. Men should   inform their doctors if they wish to father a child. This medicine may lower sperm counts. Talk to your health care professional or pharmacist for more information. What side effects may I notice from receiving this medicine? Side effects that you should  report to your doctor or health care professional as soon as possible: -allergic reactions like skin rash, itching or hives, swelling of the face, lips, or tongue -bone pain -breathing problems -changes in vision -chest pain -feeling faint or lightheaded, falls -fever, chills -pain, swelling, warmth in the leg -pain, tingling, numbness in the hands or feet -signs and symptoms of low blood pressure like dizziness; feeling faint or lightheaded, falls; unusually weak or tired -stomach pain -swelling of the ankles, feet, hands -trouble passing urine or change in the amount of urine -unusually high or low blood pressure -unusually weak or tired Side effects that usually do not require medical attention (report to your doctor or health care professional if they continue or are bothersome): -change in sex drive or performance -changes in breast size in both males and females -changes in emotions or moods -headache -hot flashes -irritation at site where injected -loss of appetite -skin problems like acne, dry skin -vaginal dryness This list may not describe all possible side effects. Call your doctor for medical advice about side effects. You may report side effects to FDA at 1-800-FDA-1088. Where should I keep my medicine? This drug is given in a hospital or clinic and will not be stored at home. NOTE: This sheet is a summary. It may not cover all possible information. If you have questions about this medicine, talk to your doctor, pharmacist, or health care provider.    2016, Elsevier/Gold Standard. (2013-07-31 11:10:35)  

## 2017-06-21 NOTE — Telephone Encounter (Signed)
Gave patient AVs and calendar of upcoming January through March appointments.

## 2017-06-21 NOTE — Progress Notes (Addendum)
[place]  [phone] [phone]   ID: Sincere Berlanga Dines   DOB: 1976/04/19  MR#: [id number]  NTI#:[id number]  PCP: [name], MD GYN: SU: Osborn Coho, MD;  Theodoro Kos, MD OTHER MD: Alysia Penna, MD;  Melrose Nakayama, MD  CHIEF COMPLAINT:  Locally advanced right breast cancer  CURRENT TREATMENT: Anastrozole, goserelin  HISTORY OF PRESENT ILLNESS: From the original intake note:  The patient noted swelling and redness, as well as some tenderness, over her right breast for some time. She thought this might be related to pregnancy, since she delivered her third child about 8 months ago. As the problem did not improve, she brought it to her primary care physician's attention. He treated her with Bactrim, which she says she could not tolerate because of nausea and vomiting, but which in any case did not resolve the problem, so he set her up for mammography at [facility] 08/26/2009. Dr. Sadie Haber found by exam marked skin thickening and erythema of the right breast extending pretty much throughout the breast. By mammography there were scattered fibroglandular densities, and an asymmetric density in the right upper outer quadrant with suspicious microcalcifications. There were also enlarged right axillary lymph nodes. The left appeared normal. By ultrasound there was an ill defined area of hypoechoic tissue in the right upper outer quadrant that was difficult to measure. There were also abnormal right axillary lymph nodes.  Ultrasound guided biopsy was performed the same day, and showed [phone]) a poorly differentiated invasive ductal carcinoma, which was estrogen receptor poor at 3%, progesterone receptor positive at 14% with an elevated proliferation marker at 50%, but importantly with strong amplification of HER-2 by CISH with a ratio of 5.20.  Bilateral breast MRIs were obtained 09/02/2009. This showed an area up to 10.6 cm in the right  breast showing confluent mass and non-mass enhancement. There was marked skin thickening involving the entire right breast, and numerous bulky right axillary lymph nodes were identified. The left breast was unremarkable, and there was no evidence of internal mammary lymph node involvement.  Patient was treated in the neoadjuvant setting with 4 dose dense cycles of doxorubicin and cyclophosphamide, followed by 12 weekly doses of paclitaxel and trastuzumab. Novella underwent definitive right modified radical mastectomy in October 2011, with a residual 2.5 mm area of tumor in the breast, grade 3, and one of 21 lymph nodes involved.   Her subsequent history is as detailed below  INTERVAL HISTORY: Niambi returns today for follow-up and treatment of her estrogen receptor positive breast cancer. She continues on goserelin, with a dose due today. She is also taking Anastrozole daily as well.  She is tolerating these medications moderately well.  She is struggling with menopausal issues.     REVIEW OF SYSTEMS: Katniss struggles with what she calls menopausal symptoms.  She is experiencing bone aches, weight gain, vaginal dryness, hot flashes.  She says that she has gotten so used to the side effects that she has forgotten some of them and needed to be reminded.  She was previously on Tamoxifen and says that being on the Tamoxifen was worse.  She is on an online group and many survivors agree that weight gain is an issue.  She is requesting a GI referral due to fluid problems in her stomach.  She says when she presses her stomach in will dent in and Lasix helps, but she thinks she may need more.   Ruthanna's PCP is Dr. Ardelia Mems and she  saw her last in 07/2016 she was diagnosed with pneumonia.  She says since her diagnosis of pneumonia, she is scared to exercise due to shortness of breath.  She underwent spirometry in 09/2016, and did not follow up.  She says she keeps forgetting.  She underwent CT chest in 03/2017 and  it was stable with no findings.  She saw Dr. Jana Hakim in 03/2017.  Last mammogram in 10/2016 and was normal, repeat screening mammogram was recommended for 10/22/2017.  She has right clavicular pain, and she has continued neck pain.  She sees Dr. Bonney Aid and she goes to PT for her neck regarding cervical dystonia.  She also wants to talk to someone about her urinary incontinence as it relates to her Lasix.  She cannot remember their name.     PAST MEDICAL HISTORY: Past Medical History:  Diagnosis Date  . Anxiety   . Cancer ([place])   . Depression   . Essential hypertension 11/05/2015  . History of breast cancer 2011   right  . History of chemotherapy 2011  . History of radiation therapy 2011  . Hypertension    under control with med., has been on med. x 2 yr.  . Lymphedema of arm    right; no BP or puncture to right arm  . Personal history of chemotherapy 09/16/2009   rt breast  . Personal history of radiation therapy 05/2010   rt breast  . Pneumonia   . Seasonal allergies   . Sinus headache     PAST SURGICAL HISTORY: Past Surgical History:  Procedure Laterality Date  . BREAST BIOPSY Right 08/26/2009  . BREAST REDUCTION SURGERY Left 08/23/2013   Procedure: LEFT BREAST REDUCTION  ;  Surgeon: Theodoro Kos, DO;  Location: [place];  Service: Plastics;  Laterality: Left;  . CESAREAN SECTION  07/15/2001; 03/18/2004; 11/21/2008  . LATISSIMUS FLAP TO BREAST Right 03/12/2013   Procedure: RIGHT BREAST LATISSIMUS FLAP WITH EXPANDER PLACEMENT;  Surgeon: Theodoro Kos, DO;  Location: [place];  Service: Plastics;  Laterality: Right;  . LIPOSUCTION Bilateral 08/23/2013   Procedure: LIPOSUCTION;  Surgeon: Theodoro Kos, DO;  Location: [place];  Service: Plastics;  Laterality: Bilateral;  . MASTECTOMY Right 2011  . MODIFIED RADICAL MASTECTOMY Right 03/11/2010  . NASAL SEPTUM SURGERY    . PORT-A-CATH REMOVAL Left 12/01/2010  . PORTACATH PLACEMENT Left 09/12/2009  .  REDUCTION MAMMAPLASTY Left 08/2013  . REMOVAL OF TISSUE EXPANDER AND PLACEMENT OF IMPLANT Right 08/23/2013   Procedure: REMOVAL RIGHT TISSUE EXPANDER AND PLACEMENT OF IMPLANT TO RIGHT BREAST ;  Surgeon: Theodoro Kos, DO;  Location: [place];  Service: Plastics;  Laterality: Right;  . SUPRA-UMBILICAL HERNIA  [id number]  . TUBAL LIGATION  11/21/2008    FAMILY HISTORY Family History  Problem Relation Age of Onset  . Hypertension Mother   . Diabetes type II Father   . Prostate cancer Father   . Stroke Neg Hx   There is no history of breast or ovarian cancer in the immediate family, but of the patient's mother's mother's six sisters, two (the patient's great-aunts) had ovarian cancer.  GYNECOLOGIC HISTORY:   (Updated 11/26/2013) The patient is GX, P3. First child was premature. Age at first delivery, 42 years old.   she status post tubal ligation. She continues to have menstrual cycles, although irregularly.  SOCIAL HISTORY: (Updated 11/26/2013) She is disabled. Catha's children are [name], and [name]. They are 14, 12 and 7 as of September 2017. They  are all boys, all at home. The patient attends a non-denominational church in [place], which she considers her home, but she is currently living in [place].  ADVANCED DIRECTIVES: not in place  HEALTH MAINTENANCE:  (Updated 11/26/2013) Social History   Tobacco Use  . Smoking status: Former Smoker    Packs/day: 0.25    Years: 5.00    Pack years: 1.25    Types: Cigarettes    Start date: 06/07/2002    Last attempt to quit: 04/26/2010    Years since quitting: 7.1  . Smokeless tobacco: Never Used  Substance Use Topics  . Alcohol use: No  . Drug use: No    Colonoscopy: Never  PAP: April 2015  Bone density: Never  Lipid panel: August 2014    Allergies  Allergen Reactions  . Penicillins Swelling    FACIAL SWELLING  . Lyrica [Pregabalin] Nausea Only    .  Marland Kitchen Meloxicam Nausea Only  . Robaxin [Methocarbamol]  Nausea Only    Current Outpatient Medications  Medication Sig Dispense Refill  . albuterol (PROAIR HFA) 108 (90 Base) MCG/ACT inhaler Inhale 2 puffs into the lungs every 6 (six) hours as needed for wheezing or shortness of breath. 18 g 3  . albuterol (PROVENTIL) (2.5 MG/3ML) 0.083% nebulizer solution Take 3 mLs (2.5 mg total) by nebulization every 6 (six) hours as needed for wheezing or shortness of breath. 75 mL 3  . ALPRAZolam (ALPRAZOLAM XR) 1 MG 24 hr tablet Take 1 tablet (1 mg total) by mouth daily. 30 tablet 3  . anastrozole (ARIMIDEX) 1 MG tablet Take 1 tablet (1 mg total) by mouth daily. 90 tablet 4  . anastrozole (ARIMIDEX) 1 MG tablet TAKE ONE (1) TABLET BY MOUTH EVERY DAY 90 tablet 4  . carvedilol (COREG) 6.25 MG tablet TAKE ONE (1) TABLET BY MOUTH TWO (2) TIMES DAILY WITH A MEAL 180 tablet 0  . diclofenac sodium (VOLTAREN) 1 % GEL Apply 4 g topically 4 (four) times daily. Use as directed 3 Tube 4  . DULoxetine (CYMBALTA) 30 MG capsule Take 90 mg by mouth daily.     . fluticasone (FLOVENT HFA) 220 MCG/ACT inhaler Inhale 2 puffs into the lungs 2 (two) times daily. 1 Inhaler 2  . furosemide (LASIX) 40 MG tablet Take 1 tablet (40 mg total) by mouth daily. 90 tablet 0  . goserelin (ZOLADEX) 3.6 MG injection Inject 3.6 mg into the skin every 28 (twenty-eight) days.    . metFORMIN (GLUCOPHAGE) 500 MG tablet TAKE ONE (1) TABLET BY MOUTH TWO (2) TIMES DAILY WITH A MEAL 60 tablet 3  . metoCLOPramide (REGLAN) 10 MG tablet Take 1 tablet (10 mg total) by mouth every 8 (eight) hours as needed. 60 tablet 1  . oxyCODONE (OXY IR/ROXICODONE) 5 MG immediate release tablet Take 1 tablet (5 mg total) by mouth every 6 (six) hours as needed for severe pain. 120 tablet 0  . polyethylene glycol powder (GLYCOLAX) powder Take 17 g by mouth daily. 500 g 5  . potassium chloride (MICRO-K) 10 MEQ CR capsule TAKE TWO CAPSULES BY MOUTH THREE TIMES A DAY 180 capsule 1  . tiZANidine (ZANAFLEX) 4 MG tablet TAKE ONE (1)  TABLET BY MOUTH 3 TIMES DAILY 90 tablet 3  . topiramate (TOPAMAX) 200 MG tablet Take 1 tablet (200 mg total) by mouth at bedtime. 30 tablet 12  . traZODone (DESYREL) 100 MG tablet     . triamcinolone ointment (KENALOG) 0.5 % Apply 1 application topically 2 (two) times daily.  30 g 0  . VYVANSE 50 MG capsule      No current facility-administered medications for this visit.     OBJECTIVE:  Vitals:   06/21/17 1054  BP: 120/87  Pulse: 96  Resp: 18  Temp: 97.7 F (36.5 C)  SpO2: 100%     Body mass index is 40.54 kg/m.    ECOG FS: 2   Filed Weights   06/21/17 1054  Weight: 236 lb 3.2 oz (107.1 kg)   GENERAL: Patient is a well appearing female in no acute distress HEENT:  Sclerae anicteric.  Oropharynx clear and moist. No ulcerations or evidence of oropharyngeal candidiasis. Neck is supple.  NODES:  No cervical, supraclavicular, or axillary lymphadenopathy palpated.  BREAST EXAM:  Right breast s/p reconstruction, no nodules, masses, skin changes, left breast without nodules, masses skin changes or nipple changes benign bilateral breast exam LUNGS:  Clear to auscultation bilaterally.  No wheezes or rhonchi. HEART:  Regular rate and rhythm. No murmur appreciated. ABDOMEN:  Soft, nontender.  Positive, normoactive bowel sounds. No organomegaly palpated. MSK:  No focal spinal tenderness to palpation. Limited Right arm ROM due to cervical issues and pain EXTREMITIES:  No peripheral edema.   SKIN:  Clear with no obvious rashes or skin changes. No nail dyscrasia. NEURO:  Nonfocal. Well oriented.  Appropriate affect.     LAB RESULTS: Lab Results  Component Value Date   WBC 9.5 06/21/2017   NEUTROABS 4.9 06/21/2017   HGB 12.6 06/21/2017   HCT 39.1 06/21/2017   MCV 86.9 06/21/2017   PLT 286 06/21/2017      Chemistry      Component Value Date/Time   NA 141 05/25/2017 1140   K 3.3 (L) 05/25/2017 1140   CL 107 (H) 07/22/2016 1128   CL 105 09/27/2012 1044   CO2 25 05/25/2017 1140     BUN 9.1 05/25/2017 1140   CREATININE 0.8 05/25/2017 1140      Component Value Date/Time   CALCIUM 9.3 05/25/2017 1140   ALKPHOS 113 05/25/2017 1140   AST 9 05/25/2017 1140   ALT 11 05/25/2017 1140   BILITOT 0.23 05/25/2017 1140     STUDIES: No results found. ASSESSMENT: 42 y.o.  BRCA1 and BRCA2 negative [place] woman, status post right breast biopsy in March 2011 for a high-grade invasive ductal carcinoma involving overlapping sites of the right breast, ER positive 3%, PR positive 14%, strongly HER2/neu positive with MIB-1 of 15%. Biopsy proven lymph node involvement at presentation. Changes consistent with inflammatory carcinoma.   1. Treated neoadjuvantly, status post 4 dose-dense cycles of doxorubicin/cyclophosphamide, followed by 12 weekly doses of paclitaxel with trastuzumab completed September 2011.   2. Trastuzumab was then continued for a total of 1 year [to June 2012]. Post-treatment echo showed a well-preserved ejection fraction.   3. Status post right modified radical mastectomy in October 2011 showing a ypT1a ypN1 invasive ductal carcinoma, grade 3.  Status post right reconstruction with implant, March 2015   4. postmastectomy radiation completed in January 2012  5. on tamoxifen beginning January of 2012 stopped somewhere in Spring/Summer 2016  (a) anastrozole started November 2016.  6. other problems include chronic postsurgical pain, chronic right upper extremity lymphedema, and chronic depression  7. Goserelin started 09/24/2014, repeated every 4 weeks  PLAN:  Kailei is doing moderately well today.  Her last mammogram showed no evidence of recurrence in her breast, and her CT chest from last October was normal as well.  The question then becomes  about her menopausal state.  She requested to see Dr. Jana Hakim today because she was under the impression that having her ovaries removed would not put her in menopause and would be better than the injections.  She was  surprised when I told her that it would be permanent menopause.  I referred her to Dr. Sabra Heck to discuss TAH/BSO.  She will continue with monthly Goserelin injections and will continue taking Anastrozole daily.    I also recommended she follow up with her PCP about her other health issues, particularly about her breathing, since she missed her f/u about that.  She agreed.    Tabor will return monthly for Goserelin and in two months for f/u with Dr. Jana Hakim to discuss.    Wilber Bihari, NP  06/21/17 11:14 AM Medical Oncology and Hematology [facility] [address] [place], [place] [address] Tel. [phone]    Fax. [phone]   ADDENDUM: Jezebel is having the expected menopausal problems. The solution certainly is ot to move to BSO, which will make those problems permanent. We discussed stopping her goserelin treatments.  In her eyes that would be even worse than what she is now experiencing--recall the probnlems she had before with severe blleding.   Accordingly we are continuing with goserelin as before and she will have a dose today  She is benefitting from Rehab and I encouraged her to start 5 minute walks several times a day to see if we can start to improve her functinal status, which remains very poor (she is afraid of exertion at this point).  I personally saw this patient and performed a substantive portion of this encounter with the listed APP documented above.   Bobetta Lime, MD Medical Oncology and Hematology [facility] [address] [place], [place] [address] Tel. [phone]    Fax. [phone]

## 2017-06-22 ENCOUNTER — Telehealth: Payer: Self-pay | Admitting: Obstetrics & Gynecology

## 2017-06-22 NOTE — Telephone Encounter (Signed)
Called and left a message for patient to call back to schedule a new patient doctor referral appointment with our office for eval TAH/BSO with Dr. Sabra Heck. The patient picked up the call and declined an appointment for 07/04/17 due to childcare concerns.

## 2017-06-23 ENCOUNTER — Encounter: Payer: Self-pay | Admitting: General Practice

## 2017-06-23 ENCOUNTER — Ambulatory Visit: Payer: Medicare Other | Admitting: Physical Therapy

## 2017-06-23 ENCOUNTER — Encounter: Payer: Self-pay | Admitting: Physical Therapy

## 2017-06-23 DIAGNOSIS — M25511 Pain in right shoulder: Secondary | ICD-10-CM | POA: Diagnosis not present

## 2017-06-23 DIAGNOSIS — M542 Cervicalgia: Secondary | ICD-10-CM

## 2017-06-23 DIAGNOSIS — R293 Abnormal posture: Secondary | ICD-10-CM | POA: Diagnosis not present

## 2017-06-23 DIAGNOSIS — G8929 Other chronic pain: Secondary | ICD-10-CM

## 2017-06-23 NOTE — Progress Notes (Signed)
[place] Spiritual Care Note  Reached [place] by phone to make support plan. Per pt, "forced menopause" is biggest stressor. Encouraged her to explore [facility], providing contact Abigail Elmore/LCSW's phone number for more information. Making [facility] referral for mentor per pt request. [place] is aware of ongoing [place] team/programming availability, but please also page if immediate needs arise. Thank you.   [place], [place], [facility] Pager [phone] Voicemail [phone]

## 2017-06-23 NOTE — Therapy (Signed)
[place], [place], [address] Phone: [phone]   Fax:  [phone]  Physical Therapy Treatment  Patient Details  Name: Karen Hopkins MRN: [id number] Date of Birth: 31-Dec-1975 Referring Provider: Danella Sensing, NP   Encounter Date: 06/23/2017  PT End of Session - 06/23/17 1154    Visit Number  4    Number of Visits  12    Date for PT Re-Evaluation  07/13/17    PT Start Time  4315 pt arrived 6 minutes late today    PT Stop Time  1233    PT Time Calculation (min)  42 min    Activity Tolerance  Patient tolerated treatment well    Behavior During Therapy  [facility] for tasks assessed/performed       Past Medical History:  Diagnosis Date  . Anxiety   . Cancer ([place])   . Depression   . Essential hypertension 11/05/2015  . History of breast cancer 2011   right  . History of chemotherapy 2011  . History of radiation therapy 2011  . Hypertension    under control with med., has been on med. x 2 yr.  . Lymphedema of arm    right; no BP or puncture to right arm  . Personal history of chemotherapy 09/16/2009   rt breast  . Personal history of radiation therapy 05/2010   rt breast  . Pneumonia   . Seasonal allergies   . Sinus headache     Past Surgical History:  Procedure Laterality Date  . BREAST BIOPSY Right 08/26/2009  . BREAST REDUCTION SURGERY Left 08/23/2013   Procedure: LEFT BREAST REDUCTION  ;  Surgeon: Theodoro Kos, DO;  Location: [place];  Service: Plastics;  Laterality: Left;  . CESAREAN SECTION  07/15/2001; 03/18/2004; 11/21/2008  . LATISSIMUS FLAP TO BREAST Right 03/12/2013   Procedure: RIGHT BREAST LATISSIMUS FLAP WITH EXPANDER PLACEMENT;  Surgeon: Theodoro Kos, DO;  Location: [place];  Service: Plastics;  Laterality: Right;  . LIPOSUCTION Bilateral 08/23/2013   Procedure: LIPOSUCTION;  Surgeon: Theodoro Kos, DO;  Location: [place];  Service: Plastics;   Laterality: Bilateral;  . MASTECTOMY Right 2011  . MODIFIED RADICAL MASTECTOMY Right 03/11/2010  . NASAL SEPTUM SURGERY    . PORT-A-CATH REMOVAL Left 12/01/2010  . PORTACATH PLACEMENT Left 09/12/2009  . REDUCTION MAMMAPLASTY Left 08/2013  . REMOVAL OF TISSUE EXPANDER AND PLACEMENT OF IMPLANT Right 08/23/2013   Procedure: REMOVAL RIGHT TISSUE EXPANDER AND PLACEMENT OF IMPLANT TO RIGHT BREAST ;  Surgeon: Theodoro Kos, DO;  Location: [facility];  Service: Plastics;  Laterality: Right;  . SUPRA-UMBILICAL HERNIA  4008  . TUBAL LIGATION  11/21/2008    There were no vitals filed for this visit.  Subjective Assessment - 06/23/17 1153    Subjective  "I am still having pain inthe R side, DN helps but it is still there    Currently in Pain?  Yes    Pain Score  7     Aggravating Factors   sitting up straight    Pain Relieving Factors  DN, botox                      [facility] Adult PT Treatment/Exercise - 06/23/17 1223      Shoulder Exercises: Seated   Other Seated Exercises  1st rib mobs using bed sheet 2 x 10 holding 3 sec     Other Seated Exercises  seated  thoracic rotation 2 x 10 (demonstration and multiple verbal cues for proper form)      Manual Therapy   Manual Therapy  Joint mobilization    Manual therapy comments  skilled palpation and monitoring during TPDN    Joint Mobilization  grade 3 inferior 1st rib mobs    Soft tissue mobilization  Rt upper trap & scalanes      Neck Exercises: Stretches   Upper Trapezius Stretch  2 reps;30 seconds    Other Neck Stretches  scalene stretch on the r 2 x 30 sec hold       Trigger Point Dry Needling - 06/23/17 1156    Consent Given?  Yes    Education Handout Provided  Yes given previously    Upper Trapezius Response  Twitch reponse elicited;Palpable increased muscle length    Levator Scapulae Response  Twitch response elicited;Palpable increased muscle length           PT Education - 06/23/17 1236    Education  provided  Yes    Education Details  updated HEP for 1st rib mobs and thoracic mobs    Route(s) Educated  Patient    Methods  Explanation;Verbal cues    Comprehension  Verbalized understanding;Verbal cues required        Short Term Clinic Goals - 04/13/17 1356      CC Short Term Goal  #1   Title  Decrease circumference measurement at 10 cm proximal to olecranon on the right to 45 cm or less.    Baseline  45.6 on right compared to 42.8 on left at eval; 44.3 on 04/11/17    Status  Achieved      CC Short Term Goal  #2   Title  Decrease circumference measurement of abdomen at 10 cm. superior to upper tip of umbilicus to 175 cm. or less    Baseline  135.1 cm. at eval; 129 on 04/11/17    Status  Achieved       PT Long Term Goals - 06/01/17 1301      PT LONG TERM GOAL #1   Title  independent with HEP    Status  New    Target Date  07/13/17      PT LONG TERM GOAL #2   Title  improve cervical ROM by at least 10 degrees all planes for improved function    Status  New    Target Date  07/13/17      PT LONG TERM GOAL #3   Title  FOTO score improved to </= 75% limited for improved function    Status  New    Target Date  07/13/17      PT LONG TERM GOAL #4   Title  Report pain < 4/10 with activity for improved function    Status  New    Target Date  07/13/17      PT LONG TERM GOAL #5   Title  n/a        Long Term Clinic Goals - 05/11/17 1231      CC Long Term Goal  #1   Title  Decrease circumference at 10 cm. superior to right olecranon to 44.5 cm. or less    Baseline  45.6 on right compared to 42.8 on left at eval; 44.3 on 04/11/17; 43 cm-05/11/17    Status  Achieved      CC Long Term Goal  #2   Title  decrease circumference of abdomen at 10 cm. superior  to upper tip of umbilicus to 893 cm. or less    Baseline  135.1 cm. at eval; 129 on 04/11/17; 127.9-11/27/18    Status  Achieved      CC Long Term Goal  #3   Title  Reduce Lymphedema Life Impact Scale to less than 60  percent impairment.    Baseline  87% at eval on 03/16/17; 65%-05/05/17    Status  On-going         Plan - 06/23/17 1237    Clinical Impression Statement  pt reports continued pain in the shoulder / neck. Continued TPDN for the scalene, and upper trap which she reported improvement of tightness. following DN focused on manual techniques to promote rib mobs and thoracic mobilty which she required multiple verbal cues for proper form. post session she reported pain decreased to 5/10.    PT Treatment/Interventions  ADLs/Self Care Home Management;Cryotherapy;Electrical Stimulation;Moist Heat;Therapeutic exercise;Therapeutic activities;Ultrasound;Neuromuscular re-education;Patient/family education;Manual techniques;Taping;Dry needling;Passive range of motion    PT Next Visit Plan  assess response to DN and repeat PRN; needs posture/stretching HEP    PT Home Exercise Plan  rib mobs, thoracic mobility       Patient will benefit from skilled therapeutic intervention in order to improve the following deficits and impairments:  Pain, Increased fascial restricitons, Increased muscle spasms, Decreased range of motion, Impaired flexibility, Postural dysfunction, Impaired UE functional use  Visit Diagnosis: Chronic right shoulder pain  Cervicalgia  Abnormal posture     Problem List Patient Active Problem List   Diagnosis Date Noted  . Dyspnea 09/23/2016  . Eczema 08/31/2016  . Essential hypertension 11/05/2015  . Cervical dystonia 08/25/2015  . Headache 07/09/2015  . Myofascial muscle pain 04/29/2015  . Adhesive capsulitis of right shoulder 04/29/2015  . Type 2 diabetes mellitus without complication ([place]) 73/42/8768  . Lower extremity edema 10/01/2014  . Malignant neoplasm of overlapping sites of right breast in female, estrogen receptor positive ([place]) 05/24/2014  . Myalgia and myositis 05/17/2014  . Neoplasm related pain 03/12/2014  . CN (constipation) 11/26/2013  . H/O reduction  mammoplasty 10/30/2013  . Seizure ([place]) 10/06/2013  . Status post bilateral breast implants 08/31/2013  . Acquired absence of breast and absent nipple 08/23/2013  . S/P breast reconstruction, right 08/23/2013  . AC joint arthropathy 07/02/2013  . Routine adult health maintenance 05/29/2013  . Hypokalemia 05/21/2013  . Chronic pain 04/19/2013  . Elevated blood sugar 03/09/2013  . Acute bronchitis 02/22/2013  . Lymphedema 05/24/2011  . RHINOSINUSITIS, CHRONIC 04/08/2010  . Obesity 08/04/2006  . DEPRESSIVE DISORDER, NOS 08/04/2006  . Allergic rhinitis 08/04/2006   Starr [place] PT, DPT, LAT, ATC  06/23/17  12:40 PM      [facility] [address] [place], [place], [address] Phone: [phone]   Fax:  [phone]  Name: Karen Hopkins MRN: [id number] Date of Birth: 05-29-1976

## 2017-06-24 NOTE — Telephone Encounter (Signed)
Called and left a message for patient to call back to schedule a new patient doctor referral appointment with our office for eval TAH/BSO with Dr. Sabra Heck.

## 2017-06-27 ENCOUNTER — Other Ambulatory Visit: Payer: Self-pay | Admitting: Oncology

## 2017-06-27 ENCOUNTER — Telehealth: Payer: Self-pay | Admitting: Medical Oncology

## 2017-06-27 NOTE — Telephone Encounter (Signed)
Called and left a message for patient to call back to schedule a new patient doctor referral appointment with our office foreval TAH/BSOwith Dr. Sabra Heck.

## 2017-06-27 NOTE — Telephone Encounter (Signed)
Sent PA for alprazolam to [facility].

## 2017-06-28 ENCOUNTER — Ambulatory Visit: Payer: Medicare Other | Admitting: Physical Therapy

## 2017-06-28 ENCOUNTER — Encounter: Payer: Medicare Other | Admitting: Physical Therapy

## 2017-06-28 ENCOUNTER — Other Ambulatory Visit: Payer: Self-pay | Admitting: *Deleted

## 2017-06-28 ENCOUNTER — Telehealth: Payer: Self-pay | Admitting: Physical Therapy

## 2017-06-28 ENCOUNTER — Encounter: Payer: Self-pay | Admitting: Oncology

## 2017-06-28 NOTE — Telephone Encounter (Signed)
Spoke with pt regarding missing today's appointment which she reported that she forgot about. Discussed when her next scheduled appointment is and what time. If she has any questions about her appointments or if she is unsure of her schedule that she can call and ask when her next appointment is.

## 2017-06-28 NOTE — Progress Notes (Signed)
Submitted auth request for Alprazolam ER today.  Status is pending.

## 2017-06-28 NOTE — Progress Notes (Signed)
Pt's Alprazolam was denied.  Gave denial to the nurse.

## 2017-06-28 NOTE — Telephone Encounter (Signed)
This RN was given a denial for xanax XR per insurance - with need to appeal due to pt has established response of known PTSD with anxiety that can interfere with her ADL's .  Xanax XR is medically necessary for continuity of care for a known diagnosis that failed other anti anxiety medications such as ativan, xanax .  Per discussion with pt - she states she is currently out of medication - order for xanax 0.5 mg to use bid as needed called to her pharmacy for her to use until prior authorization obtained.

## 2017-06-29 ENCOUNTER — Encounter: Payer: Self-pay | Admitting: *Deleted

## 2017-06-30 ENCOUNTER — Ambulatory Visit: Payer: Medicare Other | Admitting: Physical Therapy

## 2017-06-30 ENCOUNTER — Encounter: Payer: Self-pay | Admitting: Physical Therapy

## 2017-06-30 DIAGNOSIS — R293 Abnormal posture: Secondary | ICD-10-CM | POA: Diagnosis not present

## 2017-06-30 DIAGNOSIS — M542 Cervicalgia: Secondary | ICD-10-CM | POA: Diagnosis not present

## 2017-06-30 DIAGNOSIS — M25511 Pain in right shoulder: Principal | ICD-10-CM

## 2017-06-30 DIAGNOSIS — G8929 Other chronic pain: Secondary | ICD-10-CM

## 2017-06-30 NOTE — Therapy (Addendum)
[place], [place], [address] Phone: [phone]   Fax:  [phone]  Physical Therapy Treatment  Patient Details  Name: Karen Hopkins MRN: [id number] Date of Birth: [birth year] Referring Provider: Danella Sensing, NP   Encounter Date: 06/30/2017  PT End of Session - 06/30/17 1334    Visit Number  5    Number of Visits  12    Date for PT Re-Evaluation  07/13/17    PT Start Time  1330    PT Stop Time  1435    PT Time Calculation (min)  65 min    Activity Tolerance  Patient tolerated treatment well    Behavior During Therapy  [facility] for tasks assessed/performed       Past Medical History:  Diagnosis Date  . Anxiety   . Cancer ([place])   . Depression   . Essential hypertension 11/05/2015  . History of breast cancer 2011   right  . History of chemotherapy 2011  . History of radiation therapy 2011  . Hypertension    under control with med., has been on med. x 2 yr.  . Lymphedema of arm    right; no BP or puncture to right arm  . Personal history of chemotherapy 09/16/2009   rt breast  . Personal history of radiation therapy 05/2010   rt breast  . Pneumonia   . Seasonal allergies   . Sinus headache     Past Surgical History:  Procedure Laterality Date  . BREAST BIOPSY Right 08/26/2009  . BREAST REDUCTION SURGERY Left 08/23/2013   Procedure: LEFT BREAST REDUCTION  ;  Surgeon: Theodoro Kos, DO;  Location: [facility];  Service: Plastics;  Laterality: Left;  . CESAREAN SECTION  07/15/2001; 03/18/2004; 11/21/2008  . LATISSIMUS FLAP TO BREAST Right 03/12/2013   Procedure: RIGHT BREAST LATISSIMUS FLAP WITH EXPANDER PLACEMENT;  Surgeon: Theodoro Kos, DO;  Location: [place];  Service: Plastics;  Laterality: Right;  . LIPOSUCTION Bilateral 08/23/2013   Procedure: LIPOSUCTION;  Surgeon: Theodoro Kos, DO;  Location: [facility];  Service: Plastics;  Laterality: Bilateral;  . MASTECTOMY  Right 2011  . MODIFIED RADICAL MASTECTOMY Right 03/11/2010  . NASAL SEPTUM SURGERY    . PORT-A-CATH REMOVAL Left 12/01/2010  . PORTACATH PLACEMENT Left 09/12/2009  . REDUCTION MAMMAPLASTY Left 08/2013  . REMOVAL OF TISSUE EXPANDER AND PLACEMENT OF IMPLANT Right 08/23/2013   Procedure: REMOVAL RIGHT TISSUE EXPANDER AND PLACEMENT OF IMPLANT TO RIGHT BREAST ;  Surgeon: Theodoro Kos, DO;  Location: [facility];  Service: Plastics;  Laterality: Right;  . SUPRA-UMBILICAL HERNIA  8527  . TUBAL LIGATION  11/21/2008    There were no vitals filed for this visit.  Subjective Assessment - 06/30/17 1335    Subjective  I am a little sore.  It is hard to say if I am better because the pain is always there    Pertinent History  Right breast cancer with mastectomy 2011; reconstruction; left breast reduction; h/o right UE lymphedema and right upper quadrant pain, treated in this clinic several times before.    Patient Stated Goals  improve pain and motion of neck    Currently in Pain?  Yes    Pain Score  8     Pain Location  Neck    Pain Orientation  Right    Pain Descriptors / Indicators  Tightness;Shooting;Sharp    Pain Type  Chronic pain    Pain Onset  More than a month ago    Pain Frequency  Constant                      [facility] Adult PT Treatment/Exercise - 06/30/17 1338      Self-Care   Self-Care  Other Self-Care Comments    Other Self-Care Comments   discussion of importance of stretching throughout day and maintaining helpful and non flexed postures throughout day      Neck Exercises: Supine   Other Supine Exercise  on 1/2 black foam roller for passive thoracic and neck stretch with deep neck flexion stretch    Other Supine Exercise  using a physioball for thoracic extension and anterior chain stretch that may be better able to purchase      Shoulder Exercises: Supine   Other Supine Exercises  use of pillow and to try to use thoracic extension and pectoral stretch   using pillows at home for stretching of anterior chain for resting positions and sleep as she is able      Shoulder Exercises: Seated   Other Seated Exercises  seated thoracic rotation with bed sheet and shoulder right distraction.  x 10 to left and x 5 to the right Pt remarks feeling relief      Moist Heat Therapy   Number Minutes Moist Heat  15 Minutes    Moist Heat Location  Cervical      Manual Therapy   Manual Therapy  Joint mobilization    Manual therapy comments  skilled palpation and  close monitoring during TPDN and mobs and myofascial release    Joint Mobilization  grade 3 inferior 1st rib mobs with passive side bend to right by PT simultaneously,  grade 4 mob of clavicle inferiorly on right with pt commenting on relief    Soft tissue mobilization  Rt upper trap & scalanes, SCM     Myofascial Release  right scalene and Right SCM      Neck Exercises: Stretches   Upper Trapezius Stretch  2 reps;30 seconds    Other Neck Stretches  scalene stretch on the r 2 x 30 sec hold       Trigger Point Dry Needling - 06/30/17 1337    Consent Given?  Yes    Education Handout Provided  Yes verbally    Muscles Treated Upper Body  -- scalenesR twitch, C-2thru C-5 right twitch    Muscles Treated Lower Body  -- SCM right twitch response    Upper Trapezius Response  Twitch reponse elicited;Palpable increased muscle length right only    Levator Scapulae Response  Twitch response elicited;Palpable increased muscle length right only    Rhomboids Response  Twitch response elicited;Palpable increased muscle length right only              Short Term Clinic Goals - 04/13/17 1356      CC Short Term Goal  #1   Title  Decrease circumference measurement at 10 cm proximal to olecranon on the right to 45 cm or less.    Baseline  45.6 on right compared to 42.8 on left at eval; 44.3 on 04/11/17    Status  Achieved      CC Short Term Goal  #2   Title  Decrease circumference measurement of abdomen  at 10 cm. superior to upper tip of umbilicus to 161 cm. or less    Baseline  135.1 cm. at eval; 129 on 04/11/17    Status  Achieved  PT Long Term Goals - 06/01/17 1301      PT LONG TERM GOAL #1   Title  independent with HEP    Status  New    Target Date  07/13/17      PT LONG TERM GOAL #2   Title  improve cervical ROM by at least 10 degrees all planes for improved function    Status  New    Target Date  07/13/17      PT LONG TERM GOAL #3   Title  FOTO score improved to </= 75% limited for improved function    Status  New    Target Date  07/13/17      PT LONG TERM GOAL #4   Title  Report pain < 4/10 with activity for improved function    Status  New    Target Date  07/13/17      PT LONG TERM GOAL #5   Title  n/a        Long Term Clinic Goals - 05/11/17 1231      CC Long Term Goal  #1   Title  Decrease circumference at 10 cm. superior to right olecranon to 44.5 cm. or less    Baseline  45.6 on right compared to 42.8 on left at eval; 44.3 on 04/11/17; 43 cm-05/11/17    Status  Achieved      CC Long Term Goal  #2   Title  decrease circumference of abdomen at 10 cm. superior to upper tip of umbilicus to 725 cm. or less    Baseline  135.1 cm. at eval; 129 on 04/11/17; 127.9-11/27/18    Status  Achieved      CC Long Term Goal  #3   Title  Reduce Lymphedema Life Impact Scale to less than 60 percent impairment.    Baseline  87% at eval on 03/16/17; 65%-05/05/17    Status  On-going         Plan - 06/30/17 1431    Clinical Impression Statement  Ms. Fredericksen reports continuing pain in the shoulder/neck though TPDN does provide her some relief although not lasting. Pt consents and requests TPDN as well as mobilization of right clavicle and right 1st rib. Pt with initially very stiff and hard right scalenes which demonstrated palpable lengthening after TPDN in which pt was closely monitored throughout session.   Pt also comments on releif with thoracic rotation with sheet and  relief of pain momentarily with PT assist.  Pt  reports being 5/10 after RX and being able to stand straighter without stress after RX.  Pt demonstrates better thoracic rotation and looking behind her with greater ease.      Rehab Potential  Good    Clinical Impairments Affecting Rehab Potential  chronic pain issues    PT Frequency  2x / week    PT Duration  6 weeks    PT Treatment/Interventions  ADLs/Self Care Home Management;Cryotherapy;Electrical Stimulation;Moist Heat;Therapeutic exercise;Therapeutic activities;Ultrasound;Neuromuscular re-education;Patient/family education;Manual techniques;Taping;Dry needling;Passive range of motion    PT Next Visit Plan  assess response to DN and repeat PRN; needs posture/stretching HEP, use of physioball for stretching and lying on for rest wtih thoracic extension    PT Home Exercise Plan  rib mobs, thoracic mobility, use of 1/2 foam roller for  upper chest stretch/ pectoral stretch       Patient will benefit from skilled therapeutic intervention in order to improve the following deficits and impairments:  Pain, Increased fascial restricitons, Increased muscle  spasms, Decreased range of motion, Impaired flexibility, Postural dysfunction, Impaired UE functional use  Visit Diagnosis: Chronic right shoulder pain  Cervicalgia  Abnormal posture     Problem List Patient Active Problem List   Diagnosis Date Noted  . Dyspnea 09/23/2016  . Eczema 08/31/2016  . Essential hypertension 11/05/2015  . Cervical dystonia 08/25/2015  . Headache 07/09/2015  . Myofascial muscle pain 04/29/2015  . Adhesive capsulitis of right shoulder 04/29/2015  . Type 2 diabetes mellitus without complication ([place]) 31/54/0086  . Lower extremity edema 10/01/2014  . Malignant neoplasm of overlapping sites of right breast in female, estrogen receptor positive ([place]) 05/24/2014  . Myalgia and myositis 05/17/2014  . Neoplasm related pain 03/12/2014  . CN (constipation) 11/26/2013   . H/O reduction mammoplasty 10/30/2013  . Seizure ([place]) 10/06/2013  . Status post bilateral breast implants 08/31/2013  . Acquired absence of breast and absent nipple 08/23/2013  . S/P breast reconstruction, right 08/23/2013  . AC joint arthropathy 07/02/2013  . Routine adult health maintenance 05/29/2013  . Hypokalemia 05/21/2013  . Chronic pain 04/19/2013  . Elevated blood sugar 03/09/2013  . Acute bronchitis 02/22/2013  . Lymphedema 05/24/2011  . RHINOSINUSITIS, CHRONIC 04/08/2010  . Obesity 08/04/2006  . DEPRESSIVE DISORDER, NOS 08/04/2006  . Allergic rhinitis 08/04/2006    Voncille Lo, PT Certified Exercise Expert for the Aging Adult  06/30/17 2:47 PM Phone: [phone] Fax: [place] [facility] [address] [place], [place], [address] Phone: [phone]   Fax:  [phone]  Name: Noreen Mackintosh Maler MRN: [id number] Date of Birth: [birth year]

## 2017-07-01 ENCOUNTER — Telehealth: Payer: Self-pay | Admitting: *Deleted

## 2017-07-01 NOTE — Telephone Encounter (Signed)
This RN received VM from [place] stating appeal was approved per submission for xanax XR.  This RN then called to [facility] and verified prescription could be processed under insurance benefits.

## 2017-07-05 ENCOUNTER — Encounter: Payer: Self-pay | Admitting: Physical Therapy

## 2017-07-05 ENCOUNTER — Ambulatory Visit: Payer: Medicare Other | Admitting: Physical Therapy

## 2017-07-05 DIAGNOSIS — R293 Abnormal posture: Secondary | ICD-10-CM | POA: Diagnosis not present

## 2017-07-05 DIAGNOSIS — G8929 Other chronic pain: Secondary | ICD-10-CM | POA: Diagnosis not present

## 2017-07-05 DIAGNOSIS — M542 Cervicalgia: Secondary | ICD-10-CM

## 2017-07-05 DIAGNOSIS — M25511 Pain in right shoulder: Principal | ICD-10-CM

## 2017-07-05 NOTE — Therapy (Addendum)
[place], [place], [address] Phone: [phone]   Fax:  [phone]  Physical Therapy Treatment/Dishcharge Note  Patient Details  Name: Karen Hopkins MRN: [id number] Date of Birth: 06-02-1976 Referring Provider: Danella Sensing, NP   Encounter Date: 07/05/2017  PT End of Session - 07/05/17 0848    Visit Number  6    Number of Visits  12    Date for PT Re-Evaluation  07/13/17    PT Start Time  0849    PT Stop Time  0930    PT Time Calculation (min)  41 min    Activity Tolerance  Patient tolerated treatment well    Behavior During Therapy  [facility] for tasks assessed/performed       Past Medical History:  Diagnosis Date  . Anxiety   . Cancer ([place])   . Depression   . Essential hypertension 11/05/2015  . History of breast cancer 2011   right  . History of chemotherapy 2011  . History of radiation therapy 2011  . Hypertension    under control with med., has been on med. x 2 yr.  . Lymphedema of arm    right; no BP or puncture to right arm  . Personal history of chemotherapy 09/16/2009   rt breast  . Personal history of radiation therapy 05/2010   rt breast  . Pneumonia   . Seasonal allergies   . Sinus headache     Past Surgical History:  Procedure Laterality Date  . BREAST BIOPSY Right 08/26/2009  . BREAST REDUCTION SURGERY Left 08/23/2013   Procedure: LEFT BREAST REDUCTION  ;  Surgeon: Theodoro Kos, DO;  Location: [facility];  Service: Plastics;  Laterality: Left;  . CESAREAN SECTION  07/15/2001; 03/18/2004; 11/21/2008  . LATISSIMUS FLAP TO BREAST Right 03/12/2013   Procedure: RIGHT BREAST LATISSIMUS FLAP WITH EXPANDER PLACEMENT;  Surgeon: Theodoro Kos, DO;  Location: [facility];  Service: Plastics;  Laterality: Right;  . LIPOSUCTION Bilateral 08/23/2013   Procedure: LIPOSUCTION;  Surgeon: Theodoro Kos, DO;  Location: [facility];  Service: Plastics;  Laterality: Bilateral;   . MASTECTOMY Right 2011  . MODIFIED RADICAL MASTECTOMY Right 03/11/2010  . NASAL SEPTUM SURGERY    . PORT-A-CATH REMOVAL Left 12/01/2010  . PORTACATH PLACEMENT Left 09/12/2009  . REDUCTION MAMMAPLASTY Left 08/2013  . REMOVAL OF TISSUE EXPANDER AND PLACEMENT OF IMPLANT Right 08/23/2013   Procedure: REMOVAL RIGHT TISSUE EXPANDER AND PLACEMENT OF IMPLANT TO RIGHT BREAST ;  Surgeon: Theodoro Kos, DO;  Location: [facility];  Service: Plastics;  Laterality: Right;  . SUPRA-UMBILICAL HERNIA  [id number]  . TUBAL LIGATION  11/21/2008    There were no vitals filed for this visit.  Subjective Assessment - 07/05/17 0851    Subjective  I am really tired.  I really felt good after last time. I noticed that I had better posture and I noticed my right clavicle is more level    Pertinent History  Right breast cancer with mastectomy 2011; reconstruction; left breast reduction; h/o right UE lymphedema and right upper quadrant pain, treated in this clinic several times before.    Patient Stated Goals  improve pain and motion of neck    Currently in Pain?  Yes    Pain Score  7     Pain Location  Neck    Pain Orientation  Right    Pain Descriptors / Indicators  Tightness    Pain Type  Chronic pain    Pain Onset  More than a month ago    Pain Frequency  Constant         [facility] PT Assessment - 07/05/17 0854      AROM   Overall AROM Comments  heeps head in ~ starts in 8 degree right rotation but pt can correct ot neutral now    AROM Assessment Site  Cervical    Cervical Flexion  30    Cervical Extension  19    Cervical - Right Side Bend  32    Cervical - Left Side Bend  26    Cervical - Right Rotation  30    Cervical - Left Rotation  29      Strength   Right Shoulder Flexion  2+/5    Right Shoulder ABduction  2+/5    Left Shoulder Extension  4-/5    Left Shoulder ABduction  4-/5      Palpation   Palpation comment  trigger points noted in upper trap; rhomboids, levator scapula scalenes       FOTO  Taken by phone on 07-12-17  Intake 32%, predicted 65% limitation 68%            [facility] Adult PT Treatment/Exercise - 07/05/17 0907      Neck Exercises: Standing   Thumb Tacks  standing with physioball isometiric extension      Neck Exercises: Supine   Other Supine Exercise  supine neck flexion 1 inch from mat 10 x 10 sec.       Shoulder Exercises: Seated   Other Seated Exercises  AROM of cervical in allplanes.       Manual Therapy   Manual Therapy  Joint mobilization    Manual therapy comments  skilled palpation and  close monitoring during TPDN and mobs and myofascial release    Joint Mobilization  grade 3 lateral UPA C-2 t C-5 on right     Soft tissue mobilization  Rt upper trap & scalanes, SCM     Passive ROM  PROM of cervical with active stretch in all planes emphsis on Rigth sided stretching       Trigger Point Dry Needling - 07/05/17 0904    Consent Given?  Yes    Education Handout Provided  Yes verbally    Muscles Treated Upper Body  -- scalenes twtch C2-C5 twitch on right    Upper Trapezius Response  Palpable increased muscle length right only    Levator Scapulae Response  Twitch response elicited;Palpable increased muscle length right only    Rhomboids Response  Twitch response elicited;Palpable increased muscle length right only           PT Education - 07/05/17 1212    Education provided  Yes    Education Details  added to HEP for neck strength and extension.     Kasinger(s) Educated  Patient    Methods  Explanation;Demonstration;Tactile cues;Verbal cues;Handout    Comprehension  Verbalized understanding;Returned demonstration        Short Term Clinic Goals - 04/13/17 1356      CC Short Term Goal  #1   Title  Decrease circumference measurement at 10 cm proximal to olecranon on the right to 45 cm or less.    Baseline  45.6 on right compared to 42.8 on left at eval; 44.3 on 04/11/17    Status  Achieved      CC Short Term Goal  #2   Title   Decrease  circumference measurement of abdomen at 10 cm. superior to upper tip of umbilicus to 093 cm. or less    Baseline  135.1 cm. at eval; 129 on 04/11/17    Status  Achieved       PT Long Term Goals - 07/05/17 1208      PT LONG TERM GOAL #1   Title  independent with HEP    Baseline  continuing to update    Time  12    Period  Weeks    Status  Partially met     PT LONG TERM GOAL #2   Title  improve cervical ROM by at least 10 degrees all planes for improved function    Baseline  See Flow chart.  cerv flex30, ext 19, rside bend 32, left side bend 26, R rot 30, L rotation. 29    Time  12    Period  Weeks    Status  Partially Met      PT LONG TERM GOAL #3   Title  FOTO score improved to </= 75% limited for improved function    Time  12    Period  Weeks    Status  Goal met  68% limitation     PT LONG TERM GOAL #4   Title  Report pain < 4/10 with activity for improved function    Baseline  Pt 7/10 today    Time  12    Period  Weeks    Status  Pt ongoing pain 7/10, by phone      PT LONG TERM GOAL #5   Title  n/a        Long Term Clinic Goals - 05/11/17 1231      CC Long Term Goal  #1   Title  Decrease circumference at 10 cm. superior to right olecranon to 44.5 cm. or less    Baseline  45.6 on right compared to 42.8 on left at eval; 44.3 on 04/11/17; 43 cm-05/11/17    Status  Achieved      CC Long Term Goal  #2   Title  decrease circumference of abdomen at 10 cm. superior to upper tip of umbilicus to 818 cm. or less    Baseline  135.1 cm. at eval; 129 on 04/11/17; 127.9-11/27/18    Status  Achieved      CC Long Term Goal  #3   Title  Reduce Lymphedema Life Impact Scale to less than 60 percent impairment.    Baseline  87% at eval on 03/16/17; 65%-05/05/17    Status  On-going         Plan - 07/05/17 1204    Clinical Impression Statement  Karen Hopkins enters clinic and reports that she feels like her right clavicle is more level than before last visit.  Pt at 7/10  pain and pt was educated on importance of strengthening to combat muscle imbalance and chronic elevated right upper trap due to reconstructive surgery for breast cancer.. PT emphasized importance of stretching and exercising at home to maximize pain relief between sessions    Rehab Potential  Good    PT Frequency  2x / week    PT Duration  6 weeks    PT Treatment/Interventions  ADLs/Self Care Home Management;Cryotherapy;Electrical Stimulation;Moist Heat;Therapeutic exercise;Therapeutic activities;Ultrasound;Neuromuscular re-education;Patient/family education;Manual techniques;Taping;Dry needling;Passive range of motion    PT Next Visit Plan   needs posture/stretching HEP, use of physioball for stretching and lying on for rest wtih thoracic extension  PT Home Exercise Plan  rib mobs, thoracic mobility, use of 1/2 foam roller for  upper chest stretch/ pectoral stretch physioball neck extension       Patient will benefit from skilled therapeutic intervention in order to improve the following deficits and impairments:  Pain, Increased fascial restricitons, Increased muscle spasms, Decreased range of motion, Impaired flexibility, Postural dysfunction, Impaired UE functional use  Visit Diagnosis: Chronic right shoulder pain  Cervicalgia  Abnormal posture     Problem List Patient Active Problem List   Diagnosis Date Noted  . Dyspnea 09/23/2016  . Eczema 08/31/2016  . Essential hypertension 11/05/2015  . Cervical dystonia 08/25/2015  . Headache 07/09/2015  . Myofascial muscle pain 04/29/2015  . Adhesive capsulitis of right shoulder 04/29/2015  . Type 2 diabetes mellitus without complication ([place]) 76/72/0947  . Lower extremity edema 10/01/2014  . Malignant neoplasm of overlapping sites of right breast in female, estrogen receptor positive ([place]) 05/24/2014  . Myalgia and myositis 05/17/2014  . Neoplasm related pain 03/12/2014  . CN (constipation) 11/26/2013  . H/O reduction mammoplasty  10/30/2013  . Seizure ([place]) 10/06/2013  . Status post bilateral breast implants 08/31/2013  . Acquired absence of breast and absent nipple 08/23/2013  . S/P breast reconstruction, right 08/23/2013  . AC joint arthropathy 07/02/2013  . Routine adult health maintenance 05/29/2013  . Hypokalemia 05/21/2013  . Chronic pain 04/19/2013  . Elevated blood sugar 03/09/2013  . Acute bronchitis 02/22/2013  . Lymphedema 05/24/2011  . RHINOSINUSITIS, CHRONIC 04/08/2010  . Obesity 08/04/2006  . DEPRESSIVE DISORDER, NOS 08/04/2006  . Allergic rhinitis 08/04/2006   Voncille Lo, PT Certified Exercise Expert for the Aging Adult  07/05/17 12:13 PM Phone: [phone] Fax: [facility] [address] [place], [place], [address] Phone: [phone]   Fax:  [phone]  Name: Laquesha Holcomb Uher MRN: [id number] Date of Birth: 1976/01/21   PHYSICAL THERAPY DISCHARGE SUMMARY  Visits from Start of Care: 6  Current functional level related to goals / functional outcomes: Last seen on 07-05-17.  Pt cancelled due to family emergency and personal health issues.Marland Kitchen FOTO taken by phone 07-12-17   Remaining deficits: As above,  Pt with elevated right upper trap chronic due to Breast reconstruction and chronic muscle imbalance on right . Pt is helped dry needling and stretching for pain management    FOTO  Taken by phone on 07-12-17  Intake 32%, predicted 65% limitation 68% Education / Equipment: HEP and TPDN Plan: Patient agrees to discharge.  Patient goals were partially met. Patient is being discharged due to the patient's request.  ?????  And not returning since last visit.    Pt had cancellations due to family emergencies and personally dealing with incontinence issues.  Pt mother with need care for recent cancer diagnosis and Karen Raczkowski will be needed for caregiving.   Pt requests deferment of treatment until she can be more consistent with  attendance and participation.  Pt will return to MD at later date and get reevaluated and continue as needed and deemed necessary by MD.  Pt called clinic tearfully as she regrets she cannot come to therapy due to family demands at this time.  Voncille Lo, PT Certified Exercise Expert for the Aging Adult  07/12/17 11:02 AM Phone: [phone] Fax: [phone]

## 2017-07-05 NOTE — Patient Instructions (Signed)
Extension: Isometric, Ball    Stand, back to wall, ball between head and wall. Tuck chin, pushing head into ball and hold _10___ seconds. Repeat _10___ times per set. Do _2___ sets per session. Do __2__ sessions per week.  Voncille Lo, PT Certified Exercise Expert for the Aging Adult  07/05/17 9:00 AM Phone: [phone] Fax: [phone]

## 2017-07-07 ENCOUNTER — Ambulatory Visit: Payer: Medicare Other | Admitting: Physical Therapy

## 2017-07-11 ENCOUNTER — Other Ambulatory Visit: Payer: Self-pay | Admitting: Family Medicine

## 2017-07-12 ENCOUNTER — Ambulatory Visit: Payer: Medicare Other | Admitting: Physical Therapy

## 2017-07-13 ENCOUNTER — Other Ambulatory Visit (HOSPITAL_COMMUNITY)
Admission: RE | Admit: 2017-07-13 | Discharge: 2017-07-13 | Disposition: A | Discharge status: Home / Self Care | Payer: Medicare Other | Source: Ambulatory Visit | Attending: Obstetrics & Gynecology | Admitting: Obstetrics & Gynecology

## 2017-07-13 ENCOUNTER — Encounter: Payer: Self-pay | Admitting: Obstetrics & Gynecology

## 2017-07-13 ENCOUNTER — Ambulatory Visit (INDEPENDENT_AMBULATORY_CARE_PROVIDER_SITE_OTHER): Payer: Medicare Other | Admitting: Obstetrics & Gynecology

## 2017-07-13 VITALS — BP 124/82 | HR 90 | Resp 16 | Ht 63.5 in | Wt 230.0 lb

## 2017-07-13 DIAGNOSIS — Z124 Encounter for screening for malignant neoplasm of cervix: Secondary | ICD-10-CM | POA: Insufficient documentation

## 2017-07-13 DIAGNOSIS — N951 Menopausal and female climacteric states: Secondary | ICD-10-CM

## 2017-07-13 DIAGNOSIS — C50811 Malignant neoplasm of overlapping sites of right female breast: Secondary | ICD-10-CM | POA: Diagnosis not present

## 2017-07-13 DIAGNOSIS — Z8489 Family history of other specified conditions: Secondary | ICD-10-CM

## 2017-07-13 DIAGNOSIS — N3941 Urge incontinence: Secondary | ICD-10-CM

## 2017-07-13 DIAGNOSIS — Z170 Estrogen receptor positive status [ER+]: Secondary | ICD-10-CM

## 2017-07-13 NOTE — Progress Notes (Signed)
42 y.o. G3P3 Single AA female here for discussion of several issues.  1) She has been contemplating ovary removal or hysterectomy with ovary removal.  Has hx of invasive ductal breast cancer, ER+/PR+, Her-2+ diagnosed 3/11 after birth of last child.  Lesion in breast was present for months prior to diagnosis but pregnancy related breast changes made diagnosis more difficult--according to pt.  Ultimately, imaging was done and abnormal findings noted.  Biopsy recommended.  After biopsy, MRI performed showing 10.6cm lesions and bulky right axillary nodes.  Neoadjuvant chemotherapy performed before definitive surgery was performed.  Right modified radical mastectomy 10/11 with residual 2.90m area of tumor in the breast found on final pathology.  Also, on 1/21 nodes were positive.    She did have amenorrhea during the time of her chemotherapy but her cycles restarted for "awhile".  They have subsequently stopped.  She cannot be sure of exact dates.  Reports her "chemo brain" has been difficult on her and she is not good with details or dates.    She wants to know if ovary removal will help any of the symptoms she is having which include weight gain, joint pain, bone pain/aches, hot flashes and vaginal dryness.  D/w pt removal of ovaries really only indicated if will be beneficial for prevention of future cancers.  She is now menopausal and on Arimidex so removing ovaries will not do anything for treatment changes and will make her permanently menopausal as well as likely increasing cardiovascular risk.    2)  This brings [place]ato the second issue she wants to discuss today.  She reports her mother was recently diagnosed with breast cancer.  She has two maternal great aunts with ovarian cancer.  Review of records shows BRCA 1/2 testing was done and was negative.  She wants to know if this change in family hx impacts her current care.  I think she should proceed with expanded genetic testing as this time.  If there  was a abnormality present that also increased risk of ovarian cancer, then ovary removal would be indicated.  3) Urinary incontinence.  Pt has been experiencing significant urinary incontinence.  She seems to primarily have significant urgency and increased frequency.  I am sure some of this is related to menopausal symptoms but likely OAB is present as well.  She does sometimes lea with laughing, sneezing or lifting.  She is using a adult diaper due to the amount of leakage because she reports, if flow starts, then she cannot stop the stream at all.  Has wet completely through clothes in the past.  4)  Would like suggestions for PCP.     5).  Needs to get caught up with gyn care.  She did not realize how long it had been since having her last pap smear.  Patient's last menstrual period was 07/22/2014 (approximate).          Sexually active: No.  The current method of family planning is post menopausal status.    Exercising: No.   Smoker:  No but former smoker, stopped 11/11  Health Maintenance: Pap:  10/04/13, neg pap History of abnormal Pap:  no MMG:  10/21/16 Colonoscopy:  n/a BMD:   n/a TDaP:  Pt unsure Pneumonia vaccine(s):  07/20/16 Shingrix:   n/a Hep C testing: n/a   reports that she quit smoking about 7 years ago. Her smoking use included cigarettes. She started smoking about 15 years ago. She has a 1.25 pack-year smoking history. she has  never used smokeless tobacco. She reports that she does not drink alcohol or use drugs.  Past Medical History:  Diagnosis Date  . Anxiety   . Cancer ([place]) 2011   Rt. Br. Ca  . Depression   . Essential hypertension 11/05/2015  . History of breast cancer 2011   right  . History of chemotherapy 2011  . History of radiation therapy 2011  . Hypertension    under control with med., has been on med. x 2 yr.  . Lymphedema of arm    right; no BP or puncture to right arm  . Personal history of chemotherapy 09/16/2009   rt breast  . Personal  history of radiation therapy 05/2010   rt breast  . Pneumonia   . Seasonal allergies   . Sinus headache     Past Surgical History:  Procedure Laterality Date  . BREAST BIOPSY Right 08/26/2009  . BREAST REDUCTION SURGERY Left 08/23/2013   Procedure: LEFT BREAST REDUCTION  ;  Surgeon: Theodoro Kos, DO;  Location: [facility];  Service: Plastics;  Laterality: Left;  . CESAREAN SECTION  07/15/2001; 03/18/2004; 11/21/2008  . LATISSIMUS FLAP TO BREAST Right 03/12/2013   Procedure: RIGHT BREAST LATISSIMUS FLAP WITH EXPANDER PLACEMENT;  Surgeon: Theodoro Kos, DO;  Location: [facility];  Service: Plastics;  Laterality: Right;  . LIPOSUCTION Bilateral 08/23/2013   Procedure: LIPOSUCTION;  Surgeon: Theodoro Kos, DO;  Location: [facility];  Service: Plastics;  Laterality: Bilateral;  . MASTECTOMY Right 2011  . MODIFIED RADICAL MASTECTOMY Right 03/11/2010  . NASAL SEPTUM SURGERY    . PORT-A-CATH REMOVAL Left 12/01/2010  . PORTACATH PLACEMENT Left 09/12/2009  . REDUCTION MAMMAPLASTY Left 08/2013  . REMOVAL OF TISSUE EXPANDER AND PLACEMENT OF IMPLANT Right 08/23/2013   Procedure: REMOVAL RIGHT TISSUE EXPANDER AND PLACEMENT OF IMPLANT TO RIGHT BREAST ;  Surgeon: Theodoro Kos, DO;  Location: [facility];  Service: Plastics;  Laterality: Right;  . SUPRA-UMBILICAL HERNIA  [id number]  . TUBAL LIGATION  11/21/2008    Current Outpatient Medications  Medication Sig Dispense Refill  . albuterol (PROAIR HFA) 108 (90 Base) MCG/ACT inhaler Inhale 2 puffs into the lungs every 6 (six) hours as needed for wheezing or shortness of breath. 18 g 3  . albuterol (PROVENTIL) (2.5 MG/3ML) 0.083% nebulizer solution Take 3 mLs (2.5 mg total) by nebulization every 6 (six) hours as needed for wheezing or shortness of breath. 75 mL 3  . ALPRAZolam (ALPRAZOLAM XR) 1 MG 24 hr tablet Take 1 tablet (1 mg total) by mouth daily. 30 tablet 3  . anastrozole (ARIMIDEX) 1 MG tablet Take 1 tablet (1 mg total)  by mouth daily. 90 tablet 4  . carvedilol (COREG) 6.25 MG tablet TAKE ONE (1) TABLET BY MOUTH TWO (2) TIMES DAILY WITH A MEAL 180 tablet 0  . diclofenac sodium (VOLTAREN) 1 % GEL Apply 4 g topically 4 (four) times daily. Use as directed 3 Tube 4  . DULoxetine (CYMBALTA) 30 MG capsule Take 90 mg by mouth daily.     . fluticasone (FLOVENT HFA) 220 MCG/ACT inhaler Inhale 2 puffs into the lungs 2 (two) times daily. 1 Inhaler 2  . furosemide (LASIX) 40 MG tablet Take 1 tablet (40 mg total) by mouth daily. 90 tablet 0  . goserelin (ZOLADEX) 3.6 MG injection Inject 3.6 mg into the skin every 28 (twenty-eight) days.    . metFORMIN (GLUCOPHAGE) 500 MG tablet TAKE ONE (1) TABLET BY MOUTH TWO (2) TIMES DAILY  WITH A MEAL 60 tablet 3  . metoCLOPramide (REGLAN) 10 MG tablet Take 1 tablet (10 mg total) by mouth every 8 (eight) hours as needed. 60 tablet 1  . oxyCODONE (OXY IR/ROXICODONE) 5 MG immediate release tablet Take 1 tablet (5 mg total) by mouth every 6 (six) hours as needed for severe pain. 120 tablet 0  . polyethylene glycol powder (GLYCOLAX) powder Take 17 g by mouth daily. 500 g 5  . potassium chloride (MICRO-K) 10 MEQ CR capsule TAKE TWO CAPSULES BY MOUTH THREE TIMES A DAY 180 capsule 1  . tiZANidine (ZANAFLEX) 4 MG tablet TAKE ONE (1) TABLET BY MOUTH 3 TIMES DAILY 90 tablet 3  . topiramate (TOPAMAX) 200 MG tablet Take 1 tablet (200 mg total) by mouth at bedtime. 30 tablet 12  . traZODone (DESYREL) 100 MG tablet     . triamcinolone ointment (KENALOG) 0.5 % Apply 1 application topically 2 (two) times daily. 30 g 0  . VYVANSE 50 MG capsule      No current facility-administered medications for this visit.     Family History  Problem Relation Age of Onset  . Hypertension Mother   . Diabetes type II Father   . Prostate cancer Father   . Stroke Neg Hx     ROS:  Pertinent items are noted in HPI.  Otherwise, a comprehensive ROS was negative.  Exam:   BP 124/82 (BP Location: Left Arm, Patient  Position: Sitting, Cuff Size: Large)   Pulse 90   Resp 16   Ht 5' 3.5" (1.613 m)   Wt 230 lb (104.3 kg)   LMP 07/22/2014 (Approximate)   BMI 40.10 kg/m     Height: 5' 3.5" (161.3 cm)  Ht Readings from Last 3 Encounters:  07/13/17 5' 3.5" (1.613 m)  06/21/17 5' 4"  (1.626 m)  03/22/17 5' 4"  (1.626 m)    General appearance: alert, cooperative and appears stated age Head: Normocephalic, without obvious abnormality, atraumatic Neck: no adenopathy, supple, symmetrical, trachea midline and thyroid normal to inspection and palpation Lungs: clear to auscultation bilaterally Breasts: left breast without masses, skin changes, nipple discharge, LAD; right breast surgically absent with well healed TRAM flap reconstruction, no masses or abnormal skin appearance, no LAD Heart: regular rate and rhythm Abdomen: soft, non-tender; bowel sounds normal; no masses,  no organomegaly Extremities: extremities normal, atraumatic, no cyanosis or edema Skin: Skin color, texture, turgor normal. No rashes or lesions Lymph nodes: Cervical, supraclavicular, and axillary nodes normal. No abnormal inguinal nodes palpated Neurologic: Grossly normal   Pelvic: External genitalia:  no lesions              Urethra:  normal appearing urethra with no masses, tenderness or lesions              Bartholins and Skenes: normal                 Vagina: normal appearing vagina with normal color and discharge, no lesions              Cervix: no lesions              Pap taken: Yes.   Bimanual Exam:  Uterus:  normal size, contour, position, consistency, mobility, non-tender              Adnexa: normal adnexa and no mass, fullness, tenderness               Rectovaginal: Confirms  Anus:  normal sphincter tone, no lesions  Chaperone was present for exam.  A:  H/O ER+/PR+, Her-2 + invasive ductal breast cancer 3/11, s/p neoadjuvant chemo, radiation.  Tamoxifen use 1/12 - summer 2016.  Now on Anastrozole and  Goserelin. Chemotherapy induced menopause New family hx of breast cancer in mother, h/o two great aunts with ovarian cancer Significant urinary incontinence and urgency symptoms  P:   Pap and HR HPV obtained today Refer back to cancer center for expanded genetic testing Referral to urology for urodynamics. PCPs discussed Will need to follow up with pt after genetic testing and then decide about any future surgery but, for now, I do not think this is indicated.  ~60 minutes spent with patient >50% of time was in face to face discussion of above.  Pt with complicated medical hx as well as, self admittedly, not being the best historian.

## 2017-07-13 NOTE — Progress Notes (Signed)
Spoke with Seth Bake at the [facility] Advised patient will need in depth genetic testing. Patient has a history of breast cancer and her mother was diagnosed with breast cancer over the weekend. Per Seth Bake as the patient is a current patient she will forward her referral to her scheduling team to contact the patient to schedule genetic testing. Patient is agreeable.

## 2017-07-14 ENCOUNTER — Encounter: Payer: Medicare Other | Attending: Registered Nurse

## 2017-07-14 ENCOUNTER — Encounter: Payer: Self-pay | Admitting: Physical Medicine & Rehabilitation

## 2017-07-14 ENCOUNTER — Ambulatory Visit (HOSPITAL_BASED_OUTPATIENT_CLINIC_OR_DEPARTMENT_OTHER): Payer: Medicare Other | Admitting: Physical Medicine & Rehabilitation

## 2017-07-14 VITALS — BP 133/91 | HR 102 | Resp 14

## 2017-07-14 DIAGNOSIS — G894 Chronic pain syndrome: Secondary | ICD-10-CM

## 2017-07-14 DIAGNOSIS — I890 Lymphedema, not elsewhere classified: Secondary | ICD-10-CM | POA: Insufficient documentation

## 2017-07-14 DIAGNOSIS — Z79899 Other long term (current) drug therapy: Secondary | ICD-10-CM

## 2017-07-14 DIAGNOSIS — M546 Pain in thoracic spine: Secondary | ICD-10-CM | POA: Insufficient documentation

## 2017-07-14 DIAGNOSIS — G243 Spasmodic torticollis: Secondary | ICD-10-CM

## 2017-07-14 DIAGNOSIS — Z5181 Encounter for therapeutic drug level monitoring: Secondary | ICD-10-CM | POA: Diagnosis not present

## 2017-07-14 MED ORDER — OXYCODONE HCL 5 MG PO TABS: 5.0000 mg | ORAL_TABLET | Freq: Four times a day (QID) | ORAL | 0 refills | Status: DC | PRN

## 2017-07-14 NOTE — Patient Instructions (Signed)

## 2017-07-14 NOTE — Telephone Encounter (Signed)
Red team, please let patient know I am refilling her metformin but I'd like her to schedule an appointment with me to follow up on her diabetes.  Thanks! [name], MD

## 2017-07-14 NOTE — Progress Notes (Signed)
Botulinum toxin injection for cervical dystonia CPT code [phone] Diagnosis code G 24.3 Indication is cervical dystonia that has not responded to conservative care and interferes with activities of daily living as well as cervical range of motion. Chronic cervical pain not relieved by other treatments.  Informed consent was obtained after describing risks and benefits of the procedure with the patient this included bleeding bruising and infection The patient elects to proceed and has given Written consent.  REMS form completed Right Trapezius, 50 Right levator, 75 units Right sternocleidomastoid, 25units Right scalene 25 units ant 25 U middle Patient placed in a seated position A 27-gauge 1 inch needle electrode was used to guide the injection under EMG guidance.   All injections done after negative drawback for blood. Patient tolerated procedure well. Post procedure instructions given. Follow up appointment made  Indication for chronic opioid: Neoplasm related pain Medication and dose: oxycodone 5mg  QID # pills per month: 120 Last UDS date: 07/14/2017 Pain contract signed (Y/N): y Date narcotic database last reviewed (include red flags): 07/14/17

## 2017-07-15 ENCOUNTER — Other Ambulatory Visit: Payer: Self-pay

## 2017-07-15 DIAGNOSIS — Z853 Personal history of malignant neoplasm of breast: Secondary | ICD-10-CM

## 2017-07-15 DIAGNOSIS — Z803 Family history of malignant neoplasm of breast: Secondary | ICD-10-CM

## 2017-07-15 LAB — CYTOLOGY - PAP
Diagnosis: NEGATIVE
HPV 16/18/45 GENOTYPING: POSITIVE — AB
HPV: DETECTED — AB

## 2017-07-15 NOTE — Telephone Encounter (Signed)
LMOVM informing pt of refill and told her to call back to schedule an appt to fu on her diabetes. Deseree Kennon Holter, CMA

## 2017-07-16 ENCOUNTER — Other Ambulatory Visit: Payer: Self-pay | Admitting: Oncology

## 2017-07-18 ENCOUNTER — Other Ambulatory Visit: Payer: Self-pay | Admitting: Oncology

## 2017-07-18 NOTE — Progress Notes (Signed)
I did not really need to see Myriam Jacobson I just wanted to let her know Dr. Barbaraann Cao suggestion to go off the Xanax.  She expressed some concerns but is willing to taper off.  She will let us know if that does not work.

## 2017-07-19 ENCOUNTER — Telehealth: Payer: Self-pay

## 2017-07-19 ENCOUNTER — Ambulatory Visit (HOSPITAL_BASED_OUTPATIENT_CLINIC_OR_DEPARTMENT_OTHER): Payer: Medicare Other | Admitting: Oncology

## 2017-07-19 ENCOUNTER — Other Ambulatory Visit: Payer: Self-pay

## 2017-07-19 ENCOUNTER — Telehealth: Payer: Self-pay | Admitting: *Deleted

## 2017-07-19 ENCOUNTER — Ambulatory Visit: Payer: Medicare Other

## 2017-07-19 DIAGNOSIS — R87810 Cervical high risk human papillomavirus (HPV) DNA test positive: Secondary | ICD-10-CM

## 2017-07-19 DIAGNOSIS — C50811 Malignant neoplasm of overlapping sites of right female breast: Secondary | ICD-10-CM

## 2017-07-19 DIAGNOSIS — Z170 Estrogen receptor positive status [ER+]: Secondary | ICD-10-CM

## 2017-07-19 LAB — TOXASSURE SELECT,+ANTIDEPR,UR

## 2017-07-19 MED ORDER — HYDROXYZINE HCL 25 MG PO TABS: 25.0000 mg | ORAL_TABLET | Freq: Two times a day (BID) | ORAL | 0 refills | Status: DC

## 2017-07-19 NOTE — Telephone Encounter (Signed)
-----   Message from Megan Salon, MD sent at 07/18/2017  4:05 PM EST ----- Please let pt know her pap was normal but HR HPV testing was positive.  Subtype 16 was positive as well.  Needs a colposcopy.  Please schedule.

## 2017-07-19 NOTE — Telephone Encounter (Signed)
Pt was not aware that she had an appt with Dr.Magrinat today. She would like to know what medication he would like for her to stop and if she still needed to make an appt to see Dr.Magrinat this week.   Per Dr.Magrinat, he received a request from Dr. March Rummage to stop xanax xr, since it is interacting with her Vyvanse medication. Dr.Magrinat wanted to discuss switching pt to atarax BID as an alternative to xanax xr. Pt is hesitant to switch since Xanax has helped her greatly with her anxiety. Pt requesting to see if she can taper off xanax over 1 week prior to starting atarax. Okay per Dr.Magrinat.   Pt feels anxious about switching medication but is willing to try atarax. Suggested that pt start taking atarax by the end of this week and to call next week to give Dr.Magrinat updates on how she is doing. Pt voiced understanding and will do as instructed. Advised pt to refrain from driving while on medication, as it can cause some drowsiness. Pt verbalized understanding and will call with any further concerns.

## 2017-07-19 NOTE — Telephone Encounter (Signed)
Error entry

## 2017-07-19 NOTE — Telephone Encounter (Signed)
Notes recorded by Burnice Logan, RN on 07/19/2017 at 11:19 AM EST Spoke with patient, advised as seen below per Dr. Sabra Heck. LMP -no menses, on Zoladex injections. Not SA, hx tubal ligation. Patient tearful, states she has not been SA in 7 years, reviewed HPV and questions answered. Colpo scheduled for 07/27/17 at 10 am with Dr. Sabra Heck. Advised to take Motrin 800 mg with food and water one hour before procedure. Patient requesting f/u with Urology referral, advised will have referral coordinator f/u and return call, patient is agreeable. Length of call 30 minutes. See telephone encounter dated 07/19/17.  Order placed for colposcopy.   Rosa, can you f/u with urology referral?   Cc: Dr. Sabra Heck, Lerry Liner

## 2017-07-19 NOTE — Telephone Encounter (Signed)
LMOM to convey benefits.

## 2017-07-20 ENCOUNTER — Inpatient Hospital Stay: Payer: Medicare Other

## 2017-07-20 ENCOUNTER — Inpatient Hospital Stay: Payer: Medicare Other | Attending: Oncology

## 2017-07-20 VITALS — BP 103/66 | HR 110 | Temp 98.5°F | Resp 16

## 2017-07-20 DIAGNOSIS — Z170 Estrogen receptor positive status [ER+]: Secondary | ICD-10-CM | POA: Insufficient documentation

## 2017-07-20 DIAGNOSIS — C50011 Malignant neoplasm of nipple and areola, right female breast: Secondary | ICD-10-CM

## 2017-07-20 DIAGNOSIS — C50811 Malignant neoplasm of overlapping sites of right female breast: Secondary | ICD-10-CM

## 2017-07-20 DIAGNOSIS — Z5111 Encounter for antineoplastic chemotherapy: Secondary | ICD-10-CM | POA: Insufficient documentation

## 2017-07-20 LAB — CBC WITH DIFFERENTIAL/PLATELET
BASOS PCT: 0 %
Basophils Absolute: 0 10*3/uL (ref 0.0–0.1)
Eosinophils Absolute: 0.5 10*3/uL (ref 0.0–0.5)
Eosinophils Relative: 5 %
HEMATOCRIT: 40.1 % (ref 34.8–46.6)
HEMOGLOBIN: 12.8 g/dL (ref 11.6–15.9)
LYMPHS ABS: 3.6 10*3/uL — AB (ref 0.9–3.3)
LYMPHS PCT: 37 %
MCH: 27.9 pg (ref 25.1–34.0)
MCHC: 31.9 g/dL (ref 31.5–36.0)
MCV: 87.4 fL (ref 79.5–101.0)
MONOS PCT: 4 %
Monocytes Absolute: 0.4 10*3/uL (ref 0.1–0.9)
NEUTROS ABS: 5.2 10*3/uL (ref 1.5–6.5)
NEUTROS PCT: 54 %
Platelets: 291 10*3/uL (ref 145–400)
RBC: 4.59 MIL/uL (ref 3.70–5.45)
RDW: 14.5 % (ref 11.2–14.5)
WBC: 9.7 10*3/uL (ref 3.9–10.3)

## 2017-07-20 LAB — COMPREHENSIVE METABOLIC PANEL
ALBUMIN: 3.7 g/dL (ref 3.5–5.0)
ALK PHOS: 102 U/L (ref 40–150)
ALT: 11 U/L (ref 0–55)
ANION GAP: 11 (ref 3–11)
AST: 11 U/L (ref 5–34)
BILIRUBIN TOTAL: 0.4 mg/dL (ref 0.2–1.2)
BUN: 10 mg/dL (ref 7–26)
CALCIUM: 9.4 mg/dL (ref 8.4–10.4)
CO2: 23 mmol/L (ref 22–29)
Chloride: 108 mmol/L (ref 98–109)
Creatinine, Ser: 0.85 mg/dL (ref 0.60–1.10)
GFR calc Af Amer: >60 mL/min (ref >60)
GFR calc non Af Amer: >60 mL/min (ref >60)
GLUCOSE: 109 mg/dL (ref 70–140)
Potassium: 3.2 mmol/L — ABNORMAL LOW (ref 3.5–5.1)
Sodium: 142 mmol/L (ref 136–145)
TOTAL PROTEIN: 7.7 g/dL (ref 6.4–8.3)

## 2017-07-20 MED ORDER — GOSERELIN ACETATE 3.6 MG ~~LOC~~ IMPL
3.6000 mg | DRUG_IMPLANT | Freq: Once | SUBCUTANEOUS | Status: AC
  Administered 2017-07-20: 3.6 mg via SUBCUTANEOUS

## 2017-07-20 MED ORDER — GOSERELIN ACETATE 3.6 MG ~~LOC~~ IMPL
DRUG_IMPLANT | SUBCUTANEOUS | Status: AC
  Filled 2017-07-20: qty 3.6

## 2017-07-21 ENCOUNTER — Telehealth: Payer: Self-pay | Admitting: *Deleted

## 2017-07-21 NOTE — Telephone Encounter (Signed)
Urine drug screen for this encounter is consistent for prescribed medication. She is prescribed alprazolam and vyvanse by another provider.

## 2017-07-22 NOTE — Telephone Encounter (Signed)
Call transferred from [facility] after review of benefits. Patient has additional questions regarding results and colpo.   Spoke with patient. Patient asking for reminder for medication prior to colpo. Advised to take Motrin 800 mg with food and water one hour before procedure.   Patient expressed concern about HPV results and colposcopy given her hx of breast cancer. Patient states this causes increased anxiety, request to talk with Dr. Sabra Heck regarding results. Advised patient Dr. Sabra Heck will review results and answer question day of colpo 2/20, patient request return call if possible prior to appt. Advised patient Dr. Sabra Heck is seeing patients, response may not be immediate. Patient is agreeable. See also previous telephone note, length of call 30 minutes.    Dr. Sabra Heck -please review

## 2017-07-25 NOTE — Telephone Encounter (Signed)
Called pt personally and answered questions.  She has colposcopy scheduled for 07/27/17.  Much more comfortable about testing that is recommended.  Ok to close encounter.

## 2017-07-26 NOTE — Telephone Encounter (Signed)
Will close encounter

## 2017-07-27 ENCOUNTER — Encounter (INDEPENDENT_AMBULATORY_CARE_PROVIDER_SITE_OTHER): Payer: Self-pay

## 2017-07-27 ENCOUNTER — Ambulatory Visit (INDEPENDENT_AMBULATORY_CARE_PROVIDER_SITE_OTHER): Payer: Medicare Other | Admitting: Obstetrics & Gynecology

## 2017-07-27 ENCOUNTER — Encounter: Payer: Self-pay | Admitting: Obstetrics & Gynecology

## 2017-07-27 DIAGNOSIS — R87810 Cervical high risk human papillomavirus (HPV) DNA test positive: Secondary | ICD-10-CM | POA: Diagnosis not present

## 2017-07-27 NOTE — Progress Notes (Signed)
42 y.o. G3P3 S AAF here for colposcopy due to +HR HPV with subtype 16 positivity noted when had AEX/new patient exam 07/13/17.    Pt's sister is Rex Kras, a former pt from [employer].    Pt brought mother's pathology report from recent breast biopsy.  Triple negative, invasive ductal, Grade 3.  Having chemoreductive treatment first.   Prior evaluation/treatment:  none.  Patient's last menstrual period was 07/22/2014 (approximate).          Sexually active: Yes.    The current method of family planning is post menopausal status.     Patient has been counseled about results and procedure.  Risks and benefits have bene reviewed including immediate and/or delayed bleeding, infection, cervical scaring from procedure, possibility of needing additional follow up as well as treatment.  rare risks of missing a lesion discussed as well.  All questions answered.  Pt ready to proceed.  BP 116/84 (BP Location: Right Arm, Patient Position: Sitting, Cuff Size: Large)   Pulse 92   Resp 18   LMP 07/22/2014 (Approximate)   Physical Exam  Constitutional: She is oriented to [place], place, and time. She appears well-developed and well-nourished.  Genitourinary: Vagina normal. There is no rash, tenderness, lesion or injury on the right labia. There is no rash, tenderness, lesion or injury on the left labia.    Lymphadenopathy:       Right: No inguinal adenopathy present.       Left: No inguinal adenopathy present.  Neurological: She is alert and oriented to [place], place, and time.  Skin: Skin is warm and dry.  Psychiatric: She has a normal mood and affect.    Speculum placed.  3% acetic acid applied to cervix for >45 seconds.  Cervix visualized with both 7.5X and 15X magnification.  Green filter also used.  Lugols solution was used.  Findings:  No abnormal findings.  Biopsy:  Not obtained.  ECC:  was performed.  Monsel's was not needed.  Excellent hemostasis was present.  Pt tolerated procedure well and  all instruments were removed.  Findings noted above on picture of cervix.  Assessment:  +16 HR HPV subtype with normal pap smear  Plan:  Pathology results will be called to patient and follow-up planned pending results.

## 2017-07-27 NOTE — Patient Instructions (Signed)
Colposcopy Post Procedure Instructions  * You may take Ibuprofen, Aleve, or Tylenol for cramping as needed.  * If Monsel's solution is used, you may have a slight black discharge for a day or two.   * Light bleeding is normal. If bleeding is heavier than your period, please call our office.  * Refrain from putting anything in your vagina until the bleeding or discharge stops (usually 2 or 3 days).  * You will be notified within one week of your biopsy results, or we will discuss your results at your follow-up appointment if needed.  

## 2017-08-08 ENCOUNTER — Telehealth: Payer: Self-pay

## 2017-08-08 NOTE — Telephone Encounter (Signed)
Pt left a VM requesting a call back, did not specify reason.  Returned her call, no answer, left her message with our contact info.

## 2017-08-11 ENCOUNTER — Encounter: Payer: Medicare Other | Attending: Registered Nurse | Admitting: Registered Nurse

## 2017-08-11 DIAGNOSIS — I890 Lymphedema, not elsewhere classified: Secondary | ICD-10-CM | POA: Insufficient documentation

## 2017-08-11 DIAGNOSIS — M546 Pain in thoracic spine: Secondary | ICD-10-CM | POA: Insufficient documentation

## 2017-08-16 ENCOUNTER — Other Ambulatory Visit: Payer: Self-pay | Admitting: Oncology

## 2017-08-16 ENCOUNTER — Inpatient Hospital Stay: Payer: Medicare Other | Attending: Oncology

## 2017-08-16 ENCOUNTER — Inpatient Hospital Stay: Payer: Medicare Other

## 2017-08-16 ENCOUNTER — Inpatient Hospital Stay (HOSPITAL_BASED_OUTPATIENT_CLINIC_OR_DEPARTMENT_OTHER): Payer: Medicare Other | Admitting: Oncology

## 2017-08-16 ENCOUNTER — Telehealth: Payer: Self-pay | Admitting: Oncology

## 2017-08-16 VITALS — BP 139/91 | HR 103 | Temp 97.5°F | Resp 18 | Ht 63.5 in | Wt 233.8 lb

## 2017-08-16 DIAGNOSIS — R609 Edema, unspecified: Secondary | ICD-10-CM | POA: Insufficient documentation

## 2017-08-16 DIAGNOSIS — Z5111 Encounter for antineoplastic chemotherapy: Secondary | ICD-10-CM | POA: Diagnosis not present

## 2017-08-16 DIAGNOSIS — C50811 Malignant neoplasm of overlapping sites of right female breast: Secondary | ICD-10-CM | POA: Diagnosis not present

## 2017-08-16 DIAGNOSIS — Z923 Personal history of irradiation: Secondary | ICD-10-CM | POA: Insufficient documentation

## 2017-08-16 DIAGNOSIS — Z79811 Long term (current) use of aromatase inhibitors: Secondary | ICD-10-CM | POA: Diagnosis not present

## 2017-08-16 DIAGNOSIS — R32 Unspecified urinary incontinence: Secondary | ICD-10-CM | POA: Diagnosis not present

## 2017-08-16 DIAGNOSIS — Z87891 Personal history of nicotine dependence: Secondary | ICD-10-CM | POA: Diagnosis not present

## 2017-08-16 DIAGNOSIS — G8929 Other chronic pain: Secondary | ICD-10-CM | POA: Diagnosis not present

## 2017-08-16 DIAGNOSIS — Z803 Family history of malignant neoplasm of breast: Secondary | ICD-10-CM

## 2017-08-16 DIAGNOSIS — Z79899 Other long term (current) drug therapy: Secondary | ICD-10-CM | POA: Insufficient documentation

## 2017-08-16 DIAGNOSIS — Z7984 Long term (current) use of oral hypoglycemic drugs: Secondary | ICD-10-CM | POA: Insufficient documentation

## 2017-08-16 DIAGNOSIS — R635 Abnormal weight gain: Secondary | ICD-10-CM | POA: Diagnosis not present

## 2017-08-16 DIAGNOSIS — K5900 Constipation, unspecified: Secondary | ICD-10-CM | POA: Diagnosis not present

## 2017-08-16 DIAGNOSIS — Z9221 Personal history of antineoplastic chemotherapy: Secondary | ICD-10-CM | POA: Insufficient documentation

## 2017-08-16 DIAGNOSIS — M791 Myalgia, unspecified site: Secondary | ICD-10-CM | POA: Diagnosis not present

## 2017-08-16 DIAGNOSIS — Z170 Estrogen receptor positive status [ER+]: Secondary | ICD-10-CM

## 2017-08-16 DIAGNOSIS — R140 Abdominal distension (gaseous): Secondary | ICD-10-CM | POA: Diagnosis not present

## 2017-08-16 DIAGNOSIS — C50411 Malignant neoplasm of upper-outer quadrant of right female breast: Secondary | ICD-10-CM | POA: Insufficient documentation

## 2017-08-16 DIAGNOSIS — Z8042 Family history of malignant neoplasm of prostate: Secondary | ICD-10-CM | POA: Insufficient documentation

## 2017-08-16 DIAGNOSIS — F418 Other specified anxiety disorders: Secondary | ICD-10-CM | POA: Diagnosis not present

## 2017-08-16 DIAGNOSIS — C50011 Malignant neoplasm of nipple and areola, right female breast: Secondary | ICD-10-CM

## 2017-08-16 LAB — CBC WITH DIFFERENTIAL/PLATELET
BASOS PCT: 0 %
Basophils Absolute: 0 10*3/uL (ref 0.0–0.1)
EOS ABS: 0.8 10*3/uL — AB (ref 0.0–0.5)
Eosinophils Relative: 8 %
HCT: 40.3 % (ref 34.8–46.6)
Hemoglobin: 13.2 g/dL (ref 11.6–15.9)
Lymphocytes Relative: 39 %
Lymphs Abs: 4 10*3/uL — ABNORMAL HIGH (ref 0.9–3.3)
MCH: 28.4 pg (ref 25.1–34.0)
MCHC: 32.8 g/dL (ref 31.5–36.0)
MCV: 86.7 fL (ref 79.5–101.0)
MONO ABS: 0.5 10*3/uL (ref 0.1–0.9)
MONOS PCT: 5 %
NEUTROS PCT: 48 %
Neutro Abs: 4.9 10*3/uL (ref 1.5–6.5)
Platelets: 285 10*3/uL (ref 145–400)
RBC: 4.65 MIL/uL (ref 3.70–5.45)
RDW: 14.3 % (ref 11.2–14.5)
WBC: 10.2 10*3/uL (ref 3.9–10.3)

## 2017-08-16 LAB — COMPREHENSIVE METABOLIC PANEL
ALBUMIN: 3.5 g/dL (ref 3.5–5.0)
ALT: 13 U/L (ref 0–55)
ANION GAP: 8 (ref 3–11)
AST: 11 U/L (ref 5–34)
Alkaline Phosphatase: 105 U/L (ref 40–150)
BUN: 7 mg/dL (ref 7–26)
CO2: 26 mmol/L (ref 22–29)
Calcium: 9.3 mg/dL (ref 8.4–10.4)
Chloride: 109 mmol/L (ref 98–109)
Creatinine, Ser: 0.76 mg/dL (ref 0.60–1.10)
GFR calc Af Amer: >60 mL/min (ref >60)
GFR calc non Af Amer: >60 mL/min (ref >60)
GLUCOSE: 96 mg/dL (ref 70–140)
POTASSIUM: 3.2 mmol/L — AB (ref 3.5–5.1)
SODIUM: 143 mmol/L (ref 136–145)
Total Bilirubin: 0.3 mg/dL (ref 0.2–1.2)
Total Protein: 7.6 g/dL (ref 6.4–8.3)

## 2017-08-16 MED ORDER — GOSERELIN ACETATE 3.6 MG ~~LOC~~ IMPL
3.6000 mg | DRUG_IMPLANT | Freq: Once | SUBCUTANEOUS | Status: AC
  Administered 2017-08-16: 3.6 mg via SUBCUTANEOUS

## 2017-08-16 MED ORDER — GOSERELIN ACETATE 3.6 MG ~~LOC~~ IMPL
DRUG_IMPLANT | SUBCUTANEOUS | Status: AC
  Filled 2017-08-16: qty 3.6

## 2017-08-16 NOTE — Telephone Encounter (Signed)
Gave patient AVs and calendar of upcoming march through October appointments.

## 2017-08-16 NOTE — Progress Notes (Signed)
[place]  [phone] [phone]   ID: Karen Hopkins   DOB: 1976-04-11  MR#: [id number]  YDX#:[id number]  PCP: [name], MD GYN: SU: Osborn Coho, MD;  Theodoro Kos, MD OTHER MD: Alysia Penna, MD;  Melrose Nakayama, MD  CHIEF COMPLAINT:  Locally advanced right breast cancer  CURRENT TREATMENT: Anastrozole, goserelin  HISTORY OF PRESENT ILLNESS: From the original intake note:  The patient noted swelling and redness, as well as some tenderness, over her right breast for some time. She thought this might be related to pregnancy, since she delivered her third child about 8 months ago. As the problem did not improve, she brought it to her primary care physician's attention. He treated her with Bactrim, which she says she could not tolerate because of nausea and vomiting, but which in any case did not resolve the problem, so he set her up for mammography at [facility] 08/26/2009. Dr. Sadie Haber found by exam marked skin thickening and erythema of the right breast extending pretty much throughout the breast. By mammography there were scattered fibroglandular densities, and an asymmetric density in the right upper outer quadrant with suspicious microcalcifications. There were also enlarged right axillary lymph nodes. The left appeared normal. By ultrasound there was an ill defined area of hypoechoic tissue in the right upper outer quadrant that was difficult to measure. There were also abnormal right axillary lymph nodes.  Ultrasound guided biopsy was performed the same day, and showed [phone]) a poorly differentiated invasive ductal carcinoma, which was estrogen receptor poor at 3%, progesterone receptor positive at 14% with an elevated proliferation marker at 50%, but importantly with strong amplification of HER-2 by CISH with a ratio of 5.20.  Bilateral breast MRIs were obtained 09/02/2009. This showed an area up to 10.6 cm in the right  breast showing confluent mass and non-mass enhancement. There was marked skin thickening involving the entire right breast, and numerous bulky right axillary lymph nodes were identified. The left breast was unremarkable, and there was no evidence of internal mammary lymph node involvement.  Patient was treated in the neoadjuvant setting with 4 dose dense cycles of doxorubicin and cyclophosphamide, followed by 12 weekly doses of paclitaxel and trastuzumab. Karen Hopkins underwent definitive right modified radical mastectomy in October 2011, with a residual 2.5 mm area of tumor in the breast, grade 3, and one of 21 lymph nodes involved.   Her subsequent history is as detailed below  INTERVAL HISTORY: Karen Hopkins returns today for follow-up and treatment of her estrogen receptor positive breast cancer. She receives goserelin every 28 days, with a dose due today. She tolerates this well.   She also continues on anastrozole, with fair tolerance. She experiences hot flashes. She denies issues with vaginal dryness. She also has arthralgias and myalgias.   Since her last visit here she also underwent colposcopy, on 07/27/2017, with the pathology ([id number]) showing no dysplasia or malignancy.  She was having a persistent cough last year and on March 29, 2017 she had a CT of the chest which showed no findings suggestive of recurrent breast cancer   REVIEW OF SYSTEMS: Karen Hopkins reports that she does not tolerate her new anxiety medication, because it doesn't help as well as the Xanax. She went to the pain clinic and was discontinued from Xanax because it was interfering with Vyvanse. She takes 3-4 oxycodone per day, gets constipated from this. She tries to takes Miralax, but this yields little relief. She feels bloated, and she  has gained weight. She is planning to see a urologist for urinary incontinence issues. Her mother was recently diagnosed with stage 4 breast cancer which was triple negative. Karen Hopkins's mother is  receiving care through [facility], but she feels that the doctors are doing too many tests on her mother and is not receiving adequate care. She denies unusual headaches, visual changes, nausea, vomiting, or dizziness. There has been no unusual cough, phlegm production, or pleurisy. This been no change in bowel or bladder habits. She denies unexplained fatigue or unexplained weight loss, bleeding, rash, or fever. A detailed review of systems was otherwise stable.    PAST MEDICAL HISTORY: Past Medical History:  Diagnosis Date  . Anxiety   . Cancer ([place]) 2011   Rt. Br. Ca  . Depression   . Essential hypertension 11/05/2015  . History of breast cancer 2011   right  . History of chemotherapy 2011  . History of radiation therapy 2011  . Hypertension    under control with med., has been on med. x 2 yr.  . Lymphedema of arm    right; no BP or puncture to right arm  . Personal history of chemotherapy 09/16/2009   rt breast  . Personal history of radiation therapy 05/2010   rt breast  . Pneumonia   . Seasonal allergies   . Sinus headache     PAST SURGICAL HISTORY: Past Surgical History:  Procedure Laterality Date  . BREAST BIOPSY Right 08/26/2009  . BREAST REDUCTION SURGERY Left 08/23/2013   Procedure: LEFT BREAST REDUCTION  ;  Surgeon: Theodoro Kos, DO;  Location: [place];  Service: Plastics;  Laterality: Left;  . CESAREAN SECTION  07/15/2001; 03/18/2004; 11/21/2008  . lateral orbiotomy Left 11/2015   Dr. Toy Cookey at [facility]  . LATISSIMUS FLAP TO BREAST Right 03/12/2013   Procedure: RIGHT BREAST LATISSIMUS FLAP WITH EXPANDER PLACEMENT;  Surgeon: Theodoro Kos, DO;  Location: [place];  Service: Plastics;  Laterality: Right;  . LIPOSUCTION Bilateral 08/23/2013   Procedure: LIPOSUCTION;  Surgeon: Theodoro Kos, DO;  Location: [place];  Service: Plastics;  Laterality: Bilateral;  . MASTECTOMY Right 2011  . MODIFIED RADICAL MASTECTOMY Right 03/11/2010  . NASAL SEPTUM  SURGERY    . PORT-A-CATH REMOVAL Left 12/01/2010  . PORTACATH PLACEMENT Left 09/12/2009  . REDUCTION MAMMAPLASTY Left 08/2013  . REMOVAL OF TISSUE EXPANDER AND PLACEMENT OF IMPLANT Right 08/23/2013   Procedure: REMOVAL RIGHT TISSUE EXPANDER AND PLACEMENT OF IMPLANT TO RIGHT BREAST ;  Surgeon: Theodoro Kos, DO;  Location: [place];  Service: Plastics;  Laterality: Right;  . SUPRA-UMBILICAL HERNIA  [id number]  . TUBAL LIGATION  11/21/2008    FAMILY HISTORY Family History  Problem Relation Age of Onset  . Hypertension Mother   . Breast cancer Mother 60  . Diabetes type II Father   . Prostate cancer Father   . Stroke Neg Hx   The patient's mother was diagnosed with stage IV breast cancer in 2018.  Also of the patient's mother's mother's six sisters, two (the patient's great-aunts) had ovarian cancer.  GYNECOLOGIC HISTORY:   (Updated 11/26/2013) The patient is GX, P3. First child was premature. Age at first delivery, 42 years old.   she status post tubal ligation. She continues to have menstrual cycles, although irregularly.  SOCIAL HISTORY: (Updated 11/26/2013) She is disabled. Karen Hopkins's children are [name], and [name]. They are 14, 12 and 7 as of September 2017. They are all boys, all at home. The  patient attends a non-denominational church in [place], which she considers her home, but she is currently living in [place].  ADVANCED DIRECTIVES: not in place  HEALTH MAINTENANCE:  (Updated 11/26/2013) Social History   Tobacco Use  . Smoking status: Former Smoker    Packs/day: 0.25    Years: 5.00    Pack years: 1.25    Types: Cigarettes    Start date: 06/07/2002    Last attempt to quit: 04/26/2010    Years since quitting: 7.3  . Smokeless tobacco: Never Used  Substance Use Topics  . Alcohol use: No  . Drug use: No    Colonoscopy: Never  PAP: April 2015  Bone density: Never  Lipid panel: August 2014    Allergies  Allergen Reactions  . Penicillins  Swelling    FACIAL SWELLING  . Lyrica [Pregabalin] Nausea Only    .  Marland Kitchen Meloxicam Nausea Only  . Robaxin [Methocarbamol] Nausea Only    Current Outpatient Medications  Medication Sig Dispense Refill  . albuterol (PROAIR HFA) 108 (90 Base) MCG/ACT inhaler Inhale 2 puffs into the lungs every 6 (six) hours as needed for wheezing or shortness of breath. 18 g 3  . albuterol (PROVENTIL) (2.5 MG/3ML) 0.083% nebulizer solution Take 3 mLs (2.5 mg total) by nebulization every 6 (six) hours as needed for wheezing or shortness of breath. 75 mL 3  . anastrozole (ARIMIDEX) 1 MG tablet Take 1 tablet (1 mg total) by mouth daily. 90 tablet 4  . carvedilol (COREG) 6.25 MG tablet TAKE ONE (1) TABLET BY MOUTH TWO (2) TIMES DAILY WITH A MEAL 180 tablet 0  . diclofenac sodium (VOLTAREN) 1 % GEL Apply 4 g topically 4 (four) times daily. Use as directed 3 Tube 4  . DULoxetine (CYMBALTA) 30 MG capsule Take 90 mg by mouth daily.     . fluticasone (FLOVENT HFA) 220 MCG/ACT inhaler Inhale 2 puffs into the lungs 2 (two) times daily. 1 Inhaler 2  . furosemide (LASIX) 40 MG tablet Take 1 tablet (40 mg total) by mouth daily. 90 tablet 0  . goserelin (ZOLADEX) 3.6 MG injection Inject 3.6 mg into the skin every 28 (twenty-eight) days.    . hydrOXYzine (ATARAX/VISTARIL) 25 MG tablet Take 1 tablet (25 mg total) by mouth 2 (two) times daily. 60 tablet 0  . metFORMIN (GLUCOPHAGE) 500 MG tablet TAKE ONE (1) TABLET BY MOUTH TWO (2) TIMES DAILY WITH A MEAL 60 tablet 1  . metoCLOPramide (REGLAN) 10 MG tablet Take 1 tablet (10 mg total) by mouth every 8 (eight) hours as needed. 60 tablet 1  . oxyCODONE (OXY IR/ROXICODONE) 5 MG immediate release tablet Take 1 tablet (5 mg total) by mouth every 6 (six) hours as needed for severe pain. 120 tablet 0  . polyethylene glycol powder (GLYCOLAX) powder Take 17 g by mouth daily. 500 g 5  . potassium chloride (MICRO-K) 10 MEQ CR capsule TAKE TWO CAPSULES BY MOUTH THREE TIMES A DAY 180 capsule 1    . tiZANidine (ZANAFLEX) 4 MG tablet TAKE ONE (1) TABLET BY MOUTH 3 TIMES DAILY 90 tablet 3  . topiramate (TOPAMAX) 200 MG tablet Take 1 tablet (200 mg total) by mouth at bedtime. 30 tablet 12  . traZODone (DESYREL) 100 MG tablet     . triamcinolone ointment (KENALOG) 0.5 % Apply 1 application topically 2 (two) times daily. 30 g 0  . VYVANSE 50 MG capsule      Current Facility-Administered Medications  Medication Dose Route Frequency Provider Last  Rate Last Dose  . goserelin (ZOLADEX) injection 3.6 mg  3.6 mg Subcutaneous Once Magrinat, Virgie Dad, MD        OBJECTIVE: Young African-American woman in no acute distress Vitals:   08/16/17 1215  BP: (!) 139/91  Pulse: (!) 103  Resp: 18  Temp: (!) 97.5 F (36.4 C)  SpO2: 100%     Body mass index is 40.77 kg/m.    ECOG FS: 2   Filed Weights   08/16/17 1215  Weight: 233 lb 12.8 oz (106.1 kg)   Sclerae unicteric, EOMs intact Oropharynx clear and moist No cervical or supraclavicular adenopathy Lungs no rales or rhonchi Heart regular rate and rhythm Abd soft, nontender, positive bowel sounds MSK no focal spinal tenderness, no upper extremity lymphedema Neuro: nonfocal, well oriented, appropriate affect Breasts: The right breast is status post mastectomy.  There is no evidence of chest wall recurrence.  The left breast is benign.  Both axillae are benign   LAB RESULTS: Lab Results  Component Value Date   WBC 10.2 08/16/2017   NEUTROABS 4.9 08/16/2017   HGB 13.2 08/16/2017   HCT 40.3 08/16/2017   MCV 86.7 08/16/2017   PLT 285 08/16/2017      Chemistry      Component Value Date/Time   NA 142 07/20/2017 1225   NA 141 05/25/2017 1140   K 3.2 (L) 07/20/2017 1225   K 3.3 (L) 05/25/2017 1140   CL 108 07/20/2017 1225   CL 105 09/27/2012 1044   CO2 23 07/20/2017 1225   CO2 25 05/25/2017 1140   BUN 10 07/20/2017 1225   BUN 9.1 05/25/2017 1140   CREATININE 0.85 07/20/2017 1225   CREATININE 0.8 05/25/2017 1140       Component Value Date/Time   CALCIUM 9.4 07/20/2017 1225   CALCIUM 9.3 05/25/2017 1140   ALKPHOS 102 07/20/2017 1225   ALKPHOS 113 05/25/2017 1140   AST 11 07/20/2017 1225   AST 9 05/25/2017 1140   ALT 11 07/20/2017 1225   ALT 11 05/25/2017 1140   BILITOT 0.4 07/20/2017 1225   BILITOT 0.23 05/25/2017 1140     STUDIES: She will be due for repeat left screening mammography May 2019  ASSESSMENT: 42 y.o.  BRCA1 and BRCA2 negative [place] woman, status post right breast biopsy in March 2011 for a high-grade invasive ductal carcinoma involving overlapping sites of the right breast, ER positive 3%, PR positive 14%, strongly HER2/neu positive with MIB-1 of 15%. Biopsy proven lymph node involvement at presentation. Changes consistent with inflammatory carcinoma.   1. Treated neoadjuvantly, status post 4 dose-dense cycles of doxorubicin/cyclophosphamide, followed by 12 weekly doses of paclitaxel with trastuzumab completed September 2011.   2. Trastuzumab was then continued for a total of 1 year [to June 2012]. Post-treatment echo showed a well-preserved ejection fraction.   3. Status post right modified radical mastectomy in October 2011 showing a ypT1a ypN1 invasive ductal carcinoma, grade 3.  Status post right reconstruction with implant, March 2015   4. postmastectomy radiation completed in January 2012  5. on tamoxifen beginning January of 2012 stopped somewhere in Spring/Summer 2016  (a) anastrozole started November 2016.  6. other problems include chronic postsurgical pain, chronic right upper extremity lymphedema, and chronic depression  7. Goserelin started 09/24/2014, repeated every 4 weeks  PLAN:  Karen Hopkins is now 7-1/2 years out from definitive surgery for her breast cancer with no evidence of disease recurrence.  This is very favorable.  She is tolerating anastrozole generally well.  The plan is to continue that for a total of 10 years, which will take [place] (counting the  tamoxifen she took earlier) to November 2022  She is benefiting from Dr. Barbaraann Cao help as far as her chronic pain is concerned.  I was sorry to learn about her mother's stage IV breast cancer.  She is being treated at [facility] by very competent oncologist.  I reassured her regarding that  We are continuing the Zoladex monthly.  Myriam Jacobson will have her mammography in May.  She will see me again in October  She knows to call for any other issues that may develop before that visit.     Magrinat, Virgie Dad, MD  08/16/17 12:39 PM Medical Oncology and Hematology [facility] [address] [place], [place] [address] Tel. [phone]    Fax. [phone]  This document serves as a record of services personally performed by Lurline Del, MD. It was created on his behalf by Sheron Nightingale, a trained medical scribe. The creation of this record is based on the scribe's personal observations and the provider's statements to them.   I have reviewed the above documentation for accuracy and completeness, and I agree with the above.

## 2017-08-17 ENCOUNTER — Other Ambulatory Visit: Payer: Self-pay | Admitting: *Deleted

## 2017-08-17 ENCOUNTER — Ambulatory Visit: Payer: Medicare Other | Admitting: Cardiovascular Disease

## 2017-08-17 NOTE — Progress Notes (Deleted)
Cardiology Office Note   Date:  08/17/2017   ID:  Karen Hopkins, DOB 04-May-1976, MRN [id number]  PCP:  Karen Rio, MD  Cardiologist:   Karen Latch, MD   No chief complaint on file.   History of Present Illness: Karen Hopkins is a 42 y.o. female with breast cancer s/p mastectomy, chemotherapy (4 cycles of doxorubicin and cyclophosphamide, 12 cycles of paclitaxel and trastuzumab) diabetes who presents for follow-up.  She presented 5/16 with shortness of breath.  She had an echo that revealed LVEF 55-60% and normal diastolic function. Her global longitudinal strain was -16.2%, which is slightly higher (worse) than her prior, which was -20%.  At that appointment Karen Hopkins was discontinued and she was started on losartan and carvedilol.  She noted lower extremity edema so lasix was increased. Since that appointment her BP was low so losartan was discontinued.  At her last appointment Karen Hopkins noted wheezing and edema.  She had been recently diagnosed with pneumonia.  BNP was within normal limits.   Past Medical History:  Diagnosis Date  . Anxiety   . Cancer ([place]) 2011   Rt. Br. Ca  . Depression   . Essential hypertension 11/05/2015  . History of breast cancer 2011   right  . History of chemotherapy 2011  . History of radiation therapy 2011  . Hypertension    under control with med., has been on med. x 2 yr.  . Lymphedema of arm    right; no BP or puncture to right arm  . Personal history of chemotherapy 09/16/2009   rt breast  . Personal history of radiation therapy 05/2010   rt breast  . Pneumonia   . Seasonal allergies   . Sinus headache     Past Surgical History:  Procedure Laterality Date  . BREAST BIOPSY Right 08/26/2009  . BREAST REDUCTION SURGERY Left 08/23/2013   Procedure: LEFT BREAST REDUCTION  ;  Surgeon: Karen Kos, DO;  Location: [place];  Service: Plastics;  Laterality: Left;  . CESAREAN SECTION  07/15/2001; 03/18/2004;  11/21/2008  . lateral orbiotomy Left 11/2015   Dr. Toy Hopkins at [facility]  . LATISSIMUS FLAP TO BREAST Right 03/12/2013   Procedure: RIGHT BREAST LATISSIMUS FLAP WITH EXPANDER PLACEMENT;  Surgeon: Karen Kos, DO;  Location: [place];  Service: Plastics;  Laterality: Right;  . LIPOSUCTION Bilateral 08/23/2013   Procedure: LIPOSUCTION;  Surgeon: Karen Kos, DO;  Location: [place];  Service: Plastics;  Laterality: Bilateral;  . MASTECTOMY Right 2011  . MODIFIED RADICAL MASTECTOMY Right 03/11/2010  . NASAL SEPTUM SURGERY    . PORT-A-CATH REMOVAL Left 12/01/2010  . PORTACATH PLACEMENT Left 09/12/2009  . REDUCTION MAMMAPLASTY Left 08/2013  . REMOVAL OF TISSUE EXPANDER AND PLACEMENT OF IMPLANT Right 08/23/2013   Procedure: REMOVAL RIGHT TISSUE EXPANDER AND PLACEMENT OF IMPLANT TO RIGHT BREAST ;  Surgeon: Karen Kos, DO;  Location: [place];  Service: Plastics;  Laterality: Right;  . SUPRA-UMBILICAL HERNIA  0109  . TUBAL LIGATION  11/21/2008     Current Outpatient Medications  Medication Sig Dispense Refill  . albuterol (PROAIR HFA) 108 (90 Base) MCG/ACT inhaler Inhale 2 puffs into the lungs every 6 (six) hours as needed for wheezing or shortness of breath. 18 g 3  . albuterol (PROVENTIL) (2.5 MG/3ML) 0.083% nebulizer solution Take 3 mLs (2.5 mg total) by nebulization every 6 (six) hours as needed for wheezing or shortness of breath. 75 mL 3  .  anastrozole (ARIMIDEX) 1 MG tablet Take 1 tablet (1 mg total) by mouth daily. 90 tablet 4  . carvedilol (COREG) 6.25 MG tablet TAKE ONE (1) TABLET BY MOUTH TWO (2) TIMES DAILY WITH A MEAL 180 tablet 0  . diclofenac sodium (VOLTAREN) 1 % GEL Apply 4 g topically 4 (four) times daily. Use as directed 3 Tube 4  . DULoxetine (CYMBALTA) 30 MG capsule Take 90 mg by mouth daily.     . fluticasone (FLOVENT HFA) 220 MCG/ACT inhaler Inhale 2 puffs into the lungs 2 (two) times daily. 1 Inhaler 2  . furosemide (LASIX) 40 MG tablet Take 1 tablet  (40 mg total) by mouth daily. 90 tablet 0  . goserelin (ZOLADEX) 3.6 MG injection Inject 3.6 mg into the skin every 28 (twenty-eight) days.    . hydrOXYzine (ATARAX/VISTARIL) 25 MG tablet Take 1 tablet (25 mg total) by mouth 2 (two) times daily. 60 tablet 0  . metFORMIN (GLUCOPHAGE) 500 MG tablet TAKE ONE (1) TABLET BY MOUTH TWO (2) TIMES DAILY WITH A MEAL 60 tablet 1  . metoCLOPramide (REGLAN) 10 MG tablet Take 1 tablet (10 mg total) by mouth every 8 (eight) hours as needed. 60 tablet 1  . oxyCODONE (OXY IR/ROXICODONE) 5 MG immediate release tablet Take 1 tablet (5 mg total) by mouth every 6 (six) hours as needed for severe pain. 120 tablet 0  . polyethylene glycol powder (GLYCOLAX) powder Take 17 g by mouth daily. 500 g 5  . potassium chloride (MICRO-K) 10 MEQ CR capsule TAKE TWO CAPSULES BY MOUTH THREE TIMES A DAY 180 capsule 1  . tiZANidine (ZANAFLEX) 4 MG tablet TAKE ONE (1) TABLET BY MOUTH 3 TIMES DAILY 90 tablet 3  . topiramate (TOPAMAX) 200 MG tablet Take 1 tablet (200 mg total) by mouth at bedtime. 30 tablet 12  . traZODone (DESYREL) 100 MG tablet     . triamcinolone ointment (KENALOG) 0.5 % Apply 1 application topically 2 (two) times daily. 30 g 0  . VYVANSE 50 MG capsule      No current facility-administered medications for this visit.     Allergies:   Penicillins; Lyrica [pregabalin]; Meloxicam; and Robaxin [methocarbamol]    Social History:  The patient  reports that she quit smoking about 7 years ago. Her smoking use included cigarettes. She started smoking about 15 years ago. She has a 1.25 pack-year smoking history. she has never used smokeless tobacco. She reports that she does not drink alcohol or use drugs.   Family History:  The patient's family history includes Breast cancer (age of onset: 70) in her mother; Diabetes type II in her father; Hypertension in her mother; Prostate cancer in her father.    ROS:  Please see the history of present illness.   Otherwise, review of  systems are positive for none.   All other systems are reviewed and negative.    PHYSICAL EXAM: VS:  LMP 07/22/2014 (Approximate)  , BMI There is no height or weight on file to calculate BMI. GENERAL:  Well appearing HEENT:  Pupils equal round and reactive, fundi not visualized, oral mucosa unremarkable NECK:  No jugular venous distention, waveform within normal limits, carotid upstroke brisk and symmetric, no bruits LYMPHATICS:  No cervical adenopathy LUNGS:  Diffuse, mild, expiratory wheezing. HEART:  RRR.  PMI not displaced or sustained,S1 and S2 within normal limits, no S3, no S4, no clicks, no rubs, no murmurs ABD:  Flat, positive bowel sounds normal in frequency in pitch, no bruits, no  rebound, no guarding, no midline pulsatile mass, no hepatomegaly, no splenomegaly EXT:  2 plus pulses throughout, trace edema, no cyanosis no clubbing SKIN:  No rashes no nodules NEURO:  Cranial nerves II through XII grossly intact, motor grossly intact throughout PSYCH:  Cognitively intact, oriented to [place] place and time   EKG:  EKG is not ordered today. The ekg ordered 10/01/14 demonstrates sinus rhythm rate 79 bpm.  09/03/16: Sinus rhythm.  Rate 90 bpm.     Recent Labs: 09/03/2016: NT-Pro BNP 20 08/16/2017: ALT 13; BUN 7; Creatinine, Ser 0.76; Hemoglobin 13.2; Platelets 285; Potassium 3.2; Sodium 143    Lipid Panel    Component Value Date/Time   CHOL 100 09/17/2015 0817   TRIG 105 09/17/2015 0817   HDL 24 (L) 09/17/2015 0817   CHOLHDL 4.2 09/17/2015 0817   CHOLHDL 3.8 10/02/2014 0854   VLDL 13 10/02/2014 0854   LDLCALC 55 09/17/2015 0817      Wt Readings from Last 3 Encounters:  08/16/17 233 lb 12.8 oz (106.1 kg)  07/13/17 230 lb (104.3 kg)  06/21/17 236 lb 3.2 oz (107.1 kg)      ASSESSMENT AND PLAN:  # Shortness of breath: # Wheezing: This is likely due to her pneumonia.  She does not appear to be grossly volume overloaded. We will check a BNP to ensure that heart failure is  not attributing.   # Hypertension: # Hypokalemia: # LE edema:  Ms. Iglehart's BP is well-controlled. Losartan was discontinued due to low blood pressure. Continue carvedilol and Lasix.    Current medicines are reviewed at length with the patient today.  The patient does not have concerns regarding medicines.  The following changes have been made:  no change  Labs/ tests ordered today include:   No orders of the defined types were placed in this encounter.    Disposition:   FU with Skylan Lara C. Oval Linsey, MD, [facility] in 6 months.  This note was written with the assistance of speech recognition software.  Please excuse any transcriptional errors.  Signed, Charda Janis C. Oval Linsey, MD, [facility]  08/17/2017 6:30 AM    [place]

## 2017-08-18 ENCOUNTER — Other Ambulatory Visit: Payer: Self-pay | Admitting: *Deleted

## 2017-08-18 ENCOUNTER — Encounter: Payer: Self-pay | Admitting: *Deleted

## 2017-08-18 MED ORDER — FLUTICASONE PROPIONATE HFA 220 MCG/ACT IN AERO: 2.0000 | INHALATION_SPRAY | Freq: Two times a day (BID) | RESPIRATORY_TRACT | 0 refills | Status: DC

## 2017-08-18 NOTE — Telephone Encounter (Signed)
Please ask patient to schedule follow up with me, we last saw her in April of 2018. Thanks [name], MD

## 2017-08-19 NOTE — Telephone Encounter (Signed)
Called patient to make a follow up appoint per Dr. Ardelia Mems and there was no answer or voice mail.Karen Hopkins, CMA

## 2017-08-23 ENCOUNTER — Other Ambulatory Visit: Payer: Self-pay

## 2017-08-23 ENCOUNTER — Encounter: Payer: Self-pay | Admitting: Registered Nurse

## 2017-08-23 ENCOUNTER — Encounter (HOSPITAL_BASED_OUTPATIENT_CLINIC_OR_DEPARTMENT_OTHER): Payer: Medicare Other | Admitting: Registered Nurse

## 2017-08-23 VITALS — BP 124/82 | HR 101

## 2017-08-23 DIAGNOSIS — M542 Cervicalgia: Secondary | ICD-10-CM

## 2017-08-23 DIAGNOSIS — G243 Spasmodic torticollis: Secondary | ICD-10-CM | POA: Diagnosis not present

## 2017-08-23 DIAGNOSIS — G8929 Other chronic pain: Secondary | ICD-10-CM | POA: Diagnosis not present

## 2017-08-23 DIAGNOSIS — M545 Low back pain, unspecified: Secondary | ICD-10-CM

## 2017-08-23 DIAGNOSIS — G894 Chronic pain syndrome: Secondary | ICD-10-CM

## 2017-08-23 DIAGNOSIS — Z5181 Encounter for therapeutic drug level monitoring: Secondary | ICD-10-CM | POA: Diagnosis not present

## 2017-08-23 DIAGNOSIS — G893 Neoplasm related pain (acute) (chronic): Secondary | ICD-10-CM

## 2017-08-23 DIAGNOSIS — Z79899 Other long term (current) drug therapy: Secondary | ICD-10-CM | POA: Diagnosis not present

## 2017-08-23 DIAGNOSIS — M546 Pain in thoracic spine: Secondary | ICD-10-CM | POA: Diagnosis not present

## 2017-08-23 DIAGNOSIS — F3289 Other specified depressive episodes: Secondary | ICD-10-CM | POA: Diagnosis not present

## 2017-08-23 DIAGNOSIS — I890 Lymphedema, not elsewhere classified: Secondary | ICD-10-CM

## 2017-08-23 MED ORDER — OXYCODONE HCL 5 MG PO TABS: 5.0000 mg | ORAL_TABLET | Freq: Four times a day (QID) | ORAL | 0 refills | Status: DC | PRN

## 2017-08-23 MED ORDER — TIZANIDINE HCL 4 MG PO TABS: ORAL_TABLET | ORAL | 3 refills | Status: DC
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
## 2017-08-24 ENCOUNTER — Emergency Department (HOSPITAL_COMMUNITY): Payer: Medicare Other

## 2017-08-24 ENCOUNTER — Encounter (HOSPITAL_COMMUNITY): Payer: Self-pay | Admitting: Emergency Medicine

## 2017-08-24 ENCOUNTER — Other Ambulatory Visit: Payer: Self-pay

## 2017-08-24 ENCOUNTER — Emergency Department (HOSPITAL_COMMUNITY)
Admission: EM | Admit: 2017-08-24 | Discharge: 2017-08-24 | Disposition: A | Discharge status: Home / Self Care | Payer: Medicare Other | Attending: Emergency Medicine | Admitting: Emergency Medicine

## 2017-08-24 DIAGNOSIS — E119 Type 2 diabetes mellitus without complications: Secondary | ICD-10-CM | POA: Insufficient documentation

## 2017-08-24 DIAGNOSIS — R0602 Shortness of breath: Secondary | ICD-10-CM | POA: Diagnosis not present

## 2017-08-24 DIAGNOSIS — Z87891 Personal history of nicotine dependence: Secondary | ICD-10-CM | POA: Diagnosis not present

## 2017-08-24 DIAGNOSIS — R509 Fever, unspecified: Secondary | ICD-10-CM | POA: Diagnosis not present

## 2017-08-24 DIAGNOSIS — I10 Essential (primary) hypertension: Secondary | ICD-10-CM | POA: Diagnosis not present

## 2017-08-24 DIAGNOSIS — J111 Influenza due to unidentified influenza virus with other respiratory manifestations: Secondary | ICD-10-CM

## 2017-08-24 DIAGNOSIS — Z7984 Long term (current) use of oral hypoglycemic drugs: Secondary | ICD-10-CM | POA: Insufficient documentation

## 2017-08-24 DIAGNOSIS — R69 Illness, unspecified: Secondary | ICD-10-CM

## 2017-08-24 DIAGNOSIS — Z79899 Other long term (current) drug therapy: Secondary | ICD-10-CM | POA: Insufficient documentation

## 2017-08-24 DIAGNOSIS — R0600 Dyspnea, unspecified: Secondary | ICD-10-CM | POA: Diagnosis not present

## 2017-08-24 DIAGNOSIS — R05 Cough: Secondary | ICD-10-CM | POA: Diagnosis not present

## 2017-08-24 DIAGNOSIS — E876 Hypokalemia: Secondary | ICD-10-CM

## 2017-08-24 LAB — CBC
HCT: 40.3 % (ref 36.0–46.0)
HEMOGLOBIN: 13.5 g/dL (ref 12.0–15.0)
MCH: 28.2 pg (ref 26.0–34.0)
MCHC: 33.5 g/dL (ref 30.0–36.0)
MCV: 84.3 fL (ref 78.0–100.0)
PLATELETS: 280 10*3/uL (ref 150–400)
RBC: 4.78 MIL/uL (ref 3.87–5.11)
RDW: 13.9 % (ref 11.5–15.5)
WBC: 10.8 10*3/uL — ABNORMAL HIGH (ref 4.0–10.5)

## 2017-08-24 LAB — BASIC METABOLIC PANEL
Anion gap: 11 (ref 5–15)
BUN: <5 mg/dL — ABNORMAL LOW (ref 6–20)
CALCIUM: 8.8 mg/dL — AB (ref 8.9–10.3)
CHLORIDE: 107 mmol/L (ref 101–111)
CO2: 20 mmol/L — ABNORMAL LOW (ref 22–32)
CREATININE: 0.75 mg/dL (ref 0.44–1.00)
Glucose, Bld: 127 mg/dL — ABNORMAL HIGH (ref 65–99)
Potassium: 2.9 mmol/L — ABNORMAL LOW (ref 3.5–5.1)
Sodium: 138 mmol/L (ref 135–145)

## 2017-08-24 LAB — I-STAT CG4 LACTIC ACID, ED: LACTIC ACID, VENOUS: 1.34 mmol/L (ref 0.5–1.9)

## 2017-08-24 MED ORDER — OSELTAMIVIR PHOSPHATE 75 MG PO CAPS: 75.0000 mg | ORAL_CAPSULE | Freq: Two times a day (BID) | ORAL | 0 refills | Status: DC

## 2017-08-24 MED ORDER — SODIUM CHLORIDE 0.9 % IV BOLUS (SEPSIS)
1000.0000 mL | Freq: Once | INTRAVENOUS | Status: AC
  Administered 2017-08-24: 1000 mL via INTRAVENOUS

## 2017-08-24 MED ORDER — ACETAMINOPHEN 325 MG PO TABS
650.0000 mg | ORAL_TABLET | Freq: Once | ORAL | Status: AC | PRN
  Administered 2017-08-24: 650 mg via ORAL
  Filled 2017-08-24: qty 2

## 2017-08-24 MED ORDER — AEROCHAMBER PLUS FLO-VU MEDIUM MISC
1.0000 | Freq: Once | Status: AC
  Administered 2017-08-24: 1
  Filled 2017-08-24: qty 1

## 2017-08-24 MED ORDER — ACETAMINOPHEN 325 MG PO TABS
650.0000 mg | ORAL_TABLET | Freq: Once | ORAL | Status: AC
  Administered 2017-08-24: 650 mg via ORAL
  Filled 2017-08-24: qty 2

## 2017-08-24 MED ORDER — ALBUTEROL SULFATE HFA 108 (90 BASE) MCG/ACT IN AERS
1.0000 | INHALATION_SPRAY | RESPIRATORY_TRACT | Status: DC | PRN
  Filled 2017-08-24: qty 6.7

## 2017-08-24 MED ORDER — KETOROLAC TROMETHAMINE 30 MG/ML IJ SOLN
30.0000 mg | Freq: Once | INTRAMUSCULAR | Status: AC
  Administered 2017-08-24: 30 mg via INTRAVENOUS
  Filled 2017-08-24: qty 1

## 2017-08-24 MED ORDER — ONDANSETRON HCL 4 MG/2ML IJ SOLN
4.0000 mg | Freq: Once | INTRAMUSCULAR | Status: AC
  Administered 2017-08-24: 4 mg via INTRAVENOUS
  Filled 2017-08-24: qty 2

## 2017-08-24 MED ORDER — POTASSIUM CHLORIDE CRYS ER 20 MEQ PO TBCR: 20.0000 meq | EXTENDED_RELEASE_TABLET | Freq: Two times a day (BID) | ORAL | 0 refills | Status: DC

## 2017-08-24 MED ORDER — POTASSIUM CHLORIDE CRYS ER 20 MEQ PO TBCR
40.0000 meq | EXTENDED_RELEASE_TABLET | Freq: Once | ORAL | Status: AC
  Administered 2017-08-24: 40 meq via ORAL
  Filled 2017-08-24: qty 2

## 2017-08-24 MED ORDER — POTASSIUM CHLORIDE 10 MEQ/100ML IV SOLN
10.0000 meq | Freq: Once | INTRAVENOUS | Status: AC
  Administered 2017-08-24: 10 meq via INTRAVENOUS
  Filled 2017-08-24: qty 100

## 2017-08-24 MED ORDER — OXYCODONE HCL 5 MG PO TABS
5.0000 mg | ORAL_TABLET | Freq: Once | ORAL | Status: AC
  Administered 2017-08-24: 5 mg via ORAL
  Filled 2017-08-24: qty 1

## 2017-08-24 MED ORDER — IPRATROPIUM-ALBUTEROL 0.5-2.5 (3) MG/3ML IN SOLN
3.0000 mL | Freq: Once | RESPIRATORY_TRACT | Status: AC
  Administered 2017-08-24: 3 mL via RESPIRATORY_TRACT
  Filled 2017-08-24: qty 3

## 2017-08-24 NOTE — Discharge Instructions (Signed)
You were seen today for upper respiratory symptoms.  You likely have the flu.  Take Tamiflu as directed.  Make sure to stay hydrated.  You were also noted to have a low potassium.  Take potassium supplementation for the next 5 days and follow-up with your primary physician for recheck.

## 2017-08-24 NOTE — ED Provider Notes (Signed)
Pt signed out by Dr. Dina Rich.  She is feeling better.  Albuterol inhaler/spacer given prior to d/c.  She is stable for d/c.   Isla Pence, MD 08/24/17 [phone]

## 2017-08-24 NOTE — ED Notes (Signed)
Pt ambulatory to restroom with steady gait.

## 2017-08-24 NOTE — ED Triage Notes (Addendum)
Pt reports shortness of breath and fever x2 days. States hx PNA. Wheezing bilaterally. EMS gave 10 mg albuterol, 0.5 mg atrovent, 125 mg solumderol. Pt reports temperature of 104 prior to arrival, no meds. See VS flowsheet. Reports she tried oxycodone yesterday with no relief, went to pain clinic.

## 2017-08-24 NOTE — ED Provider Notes (Signed)
[facility] EMERGENCY DEPARTMENT Provider Note   CSN: [id number] Arrival date & time: 08/24/17  0413     History   Chief Complaint Chief Complaint  Patient presents with  . Shortness of Breath  . Fever    HPI Karen Hopkins is a 42 y.o. female.  HPI  This is a 42 year old female with a history of breast cancer, hypertension who presents with 2 days of fever, cough, myalgias.  Patient reports fevers at home up to 104.  She reports shortness of breath with a productive cough.  Denies any nausea, vomiting, diarrhea.  Does report generalized myalgias and "pain all over."  Denies any chest pain.  No known sick contacts.  Past Medical History:  Diagnosis Date  . Anxiety   . Cancer ([place]) 2011   Rt. Br. Ca  . Depression   . Essential hypertension 11/05/2015  . History of breast cancer 2011   right  . History of chemotherapy 2011  . History of radiation therapy 2011  . Hypertension    under control with med., has been on med. x 2 yr.  . Lymphedema of arm    right; no BP or puncture to right arm  . Personal history of chemotherapy 09/16/2009   rt breast  . Personal history of radiation therapy 05/2010   rt breast  . Pneumonia   . Seasonal allergies   . Sinus headache     Patient Active Problem List   Diagnosis Date Noted  . Dyspnea 09/23/2016  . Eczema 08/31/2016  . Essential hypertension 11/05/2015  . Cervical dystonia 08/25/2015  . Headache 07/09/2015  . Myofascial muscle pain 04/29/2015  . Adhesive capsulitis of right shoulder 04/29/2015  . Type 2 diabetes mellitus without complication ([place]) 93/23/5573  . Lower extremity edema 10/01/2014  . Malignant neoplasm of overlapping sites of right breast in female, estrogen receptor positive ([place]) 05/24/2014  . Myalgia and myositis 05/17/2014  . Neoplasm related pain 03/12/2014  . CN (constipation) 11/26/2013  . H/O reduction mammoplasty 10/30/2013  . Seizure ([place]) 10/06/2013  . Status post bilateral  breast implants 08/31/2013  . Acquired absence of breast and absent nipple 08/23/2013  . S/P breast reconstruction, right 08/23/2013  . AC joint arthropathy 07/02/2013  . Routine adult health maintenance 05/29/2013  . Hypokalemia 05/21/2013  . Chronic pain 04/19/2013  . Elevated blood sugar 03/09/2013  . Acute bronchitis 02/22/2013  . Lymphedema 05/24/2011  . RHINOSINUSITIS, CHRONIC 04/08/2010  . Obesity 08/04/2006  . DEPRESSIVE DISORDER, NOS 08/04/2006  . Allergic rhinitis 08/04/2006    Past Surgical History:  Procedure Laterality Date  . BREAST BIOPSY Right 08/26/2009  . BREAST REDUCTION SURGERY Left 08/23/2013   Procedure: LEFT BREAST REDUCTION  ;  Surgeon: Theodoro Kos, DO;  Location: [facility];  Service: Plastics;  Laterality: Left;  . CESAREAN SECTION  07/15/2001; 03/18/2004; 11/21/2008  . lateral orbiotomy Left 11/2015   Dr. Toy Cookey at [facility]  . LATISSIMUS FLAP TO BREAST Right 03/12/2013   Procedure: RIGHT BREAST LATISSIMUS FLAP WITH EXPANDER PLACEMENT;  Surgeon: Theodoro Kos, DO;  Location: [facility];  Service: Plastics;  Laterality: Right;  . LIPOSUCTION Bilateral 08/23/2013   Procedure: LIPOSUCTION;  Surgeon: Theodoro Kos, DO;  Location: [facility];  Service: Plastics;  Laterality: Bilateral;  . MASTECTOMY Right 2011  . MODIFIED RADICAL MASTECTOMY Right 03/11/2010  . NASAL SEPTUM SURGERY    . PORT-A-CATH REMOVAL Left 12/01/2010  . PORTACATH PLACEMENT Left 09/12/2009  . REDUCTION MAMMAPLASTY Left  08/2013  . REMOVAL OF TISSUE EXPANDER AND PLACEMENT OF IMPLANT Right 08/23/2013   Procedure: REMOVAL RIGHT TISSUE EXPANDER AND PLACEMENT OF IMPLANT TO RIGHT BREAST ;  Surgeon: Theodoro Kos, DO;  Location: [facility];  Service: Plastics;  Laterality: Right;  . SUPRA-UMBILICAL HERNIA  5009  . TUBAL LIGATION  11/21/2008    OB History    Gravida  3   Para  3   Term  2   Preterm  1   AB      Living  2     SAB      TAB      Ectopic       Multiple      Live Births  2            Home Medications    Prior to Admission medications   Medication Sig Start Date End Date Taking? Authorizing Provider  albuterol (PROAIR HFA) 108 (90 Base) MCG/ACT inhaler Inhale 2 puffs into the lungs every 6 (six) hours as needed for wheezing or shortness of breath. 10/12/16  Yes [name], MD  albuterol (PROVENTIL) (2.5 MG/3ML) 0.083% nebulizer solution Take 3 mLs (2.5 mg total) by nebulization every 6 (six) hours as needed for wheezing or shortness of breath. 08/03/16  Yes [name], MD  ALPRAZolam (XANAX XR) 1 MG 24 hr tablet Take 1 tablet by mouth daily.  08/16/17  Yes [provider]  anastrozole (ARIMIDEX) 1 MG tablet Take 1 tablet (1 mg total) by mouth daily. 09/07/16  Yes Magrinat, Virgie Dad, MD  carvedilol (COREG) 6.25 MG tablet TAKE ONE (1) TABLET BY MOUTH TWO (2) TIMES DAILY WITH A MEAL 06/15/17  Yes Skeet Latch, MD  diclofenac sodium (VOLTAREN) 1 % GEL Apply 4 g topically 4 (four) times daily. Use as directed Patient taking differently: Apply 4 g topically 4 (four) times daily as needed (pain). Use as directed 11/08/16  Yes Danella Sensing L, NP  DULoxetine (CYMBALTA) 30 MG capsule Take 90 mg by mouth daily.  05/18/16  Yes [provider]  fluticasone (FLOVENT HFA) 220 MCG/ACT inhaler Inhale 2 puffs into the lungs 2 (two) times daily. 08/18/17  Yes [name], MD  furosemide (LASIX) 40 MG tablet Take 1 tablet (40 mg total) by mouth daily. 06/15/17  Yes Skeet Latch, MD  goserelin (ZOLADEX) 3.6 MG injection Inject 3.6 mg into the skin every 28 (twenty-eight) days.   Yes [provider]  metFORMIN (GLUCOPHAGE) 500 MG tablet TAKE ONE (1) TABLET BY MOUTH TWO (2) TIMES DAILY WITH A MEAL 07/14/17  Yes [name], MD  metoCLOPramide (REGLAN) 10 MG tablet Take 1 tablet (10 mg total) by mouth every 8 (eight) hours as needed. Patient taking differently: Take 10 mg by mouth every 8  (eight) hours as needed for nausea or vomiting.  09/01/15  Yes Melvenia Beam, MD  oxyCODONE (OXY IR/ROXICODONE) 5 MG immediate release tablet Take 1 tablet (5 mg total) by mouth every 6 (six) hours as needed for severe pain. 08/23/17  Yes Bayard Hugger, NP  polyethylene glycol powder (GLYCOLAX) powder Take 17 g by mouth daily. Patient taking differently: Take 17 g by mouth daily as needed for mild constipation.  05/23/17  Yes Magrinat, Virgie Dad, MD  potassium chloride (MICRO-K) 10 MEQ CR capsule TAKE TWO CAPSULES BY MOUTH THREE TIMES A DAY 08/17/17  Yes Magrinat, Virgie Dad, MD  tiZANidine (ZANAFLEX) 4 MG tablet TAKE ONE (1) TABLET BY MOUTH  3 TIMES DAILY Patient taking differently: Take 4 mg by mouth every 6 (six) hours as needed for muscle spasms.  08/23/17  Yes Bayard Hugger, NP  topiramate (TOPAMAX) 200 MG tablet Take 1 tablet (200 mg total) by mouth at bedtime. 10/27/16  Yes Melvenia Beam, MD  traZODone (DESYREL) 100 MG tablet Take 100 mg by mouth at bedtime as needed for sleep.  05/18/16  Yes [provider]  triamcinolone ointment (KENALOG) 0.5 % Apply 1 application topically 2 (two) times daily. Patient taking differently: Apply 1 application topically 2 (two) times daily as needed (rash).  03/23/17  Yes [name], MD  VYVANSE 50 MG capsule Take 50 mg by mouth daily.  03/21/17  Yes [provider]  hydrOXYzine (ATARAX/VISTARIL) 25 MG tablet TAKE ONE (1) TABLET BY MOUTH TWO (2) TIMES DAILY 08/17/17   Magrinat, Virgie Dad, MD  oseltamivir (TAMIFLU) 75 MG capsule Take 1 capsule (75 mg total) by mouth every 12 (twelve) hours. 08/24/17   Santiel Topper, Barbette Hair, MD  potassium chloride SA (K-DUR,KLOR-CON) 20 MEQ tablet Take 1 tablet (20 mEq total) by mouth 2 (two) times daily. 08/24/17   Aris Moman, Barbette Hair, MD    Family History Family History  Problem Relation Age of Onset  . Hypertension Mother   . Breast cancer Mother 36  . Diabetes type II Father   . Prostate cancer  Father   . Stroke Neg Hx     Social History Social History   Tobacco Use  . Smoking status: Former Smoker    Packs/day: 0.25    Years: 5.00    Pack years: 1.25    Types: Cigarettes    Start date: 06/07/2002    Last attempt to quit: 04/26/2010    Years since quitting: 7.3  . Smokeless tobacco: Never Used  Substance Use Topics  . Alcohol use: No  . Drug use: No     Allergies   Penicillins; Lyrica [pregabalin]; Meloxicam; and Robaxin [methocarbamol]   Review of Systems Review of Systems  Constitutional: Positive for chills and fever.  HENT: Positive for congestion.   Respiratory: Positive for cough and shortness of breath.   Cardiovascular: Negative for chest pain.  Gastrointestinal: Negative for abdominal pain, nausea and vomiting.  Genitourinary: Negative for dysuria.  Musculoskeletal: Positive for myalgias.  All other systems reviewed and are negative.    Physical Exam Updated Vital Signs BP 138/82   Pulse (!) 106   Temp 99 F (37.2 C) (Oral)   Resp (!) 22   Ht 5\' 4"  (1.626 m)   Wt 104.3 kg (230 lb)   LMP 07/22/2014 (Approximate)   SpO2 98%   BMI 39.48 kg/m   Physical Exam  Constitutional: She is oriented to [place], place, and time.  Ill-appearing but nontoxic, no acute distress  HENT:  Head: Normocephalic and atraumatic.  Neck: Neck supple.  Cardiovascular: Regular rhythm and normal heart sounds.  Tachycardia  Pulmonary/Chest: Effort normal. No respiratory distress. She has no wheezes.  Abdominal: Soft. Bowel sounds are normal.  Neurological: She is alert and oriented to [place], place, and time.  Skin: Skin is warm and dry.  Psychiatric: She has a normal mood and affect.  Nursing note and vitals reviewed.    ED Treatments / Results  Labs (all labs ordered are listed, but only abnormal results are displayed) Labs Reviewed  CBC - Abnormal; Notable for the following components:      Result Value   WBC 10.8 (*)  All other components within  normal limits  BASIC METABOLIC PANEL - Abnormal; Notable for the following components:   Potassium 2.9 (*)    CO2 20 (*)    Glucose, Bld 127 (*)    BUN <5 (*)    Calcium 8.8 (*)    All other components within normal limits  I-STAT CG4 LACTIC ACID, ED    EKG  EKG Interpretation  Date/Time:  Wednesday August 24 2017 05:38:09 EDT Ventricular Rate:  109 PR Interval:    QRS Duration: 77 QT Interval:  370 QTC Calculation: 499 R Axis:   52 Text Interpretation:  Sinus tachycardia Borderline T abnormalities, diffuse leads Borderline prolonged QT interval Confirmed by Thayer Jew [phone]) on 08/24/2017 6:05:13 AM       Radiology Dg Chest 2 View  Result Date: 08/24/2017 CLINICAL DATA:  Cough and fever for 1 day. EXAM: CHEST - 2 VIEW COMPARISON:  CT 03/29/2017.  Radiograph 08/03/2016 FINDINGS: The cardiomediastinal contours are unchanged, heart normal in size. Prominent epicardial fat pad accounting for opacity adjacent to the right heart border. Pulmonary vasculature is normal. No consolidation, pleural effusion, or pneumothorax. No acute osseous abnormalities are seen. Surgical clips in the right axilla. IMPRESSION: No acute pulmonary process. Electronically Signed   By: Jeb Levering M.D.   On: 08/24/2017 05:23    Procedures Procedures (including critical care time)  Medications Ordered in ED Medications  acetaminophen (TYLENOL) tablet 650 mg (650 mg Oral Given 08/24/17 0425)  potassium chloride SA (K-DUR,KLOR-CON) CR tablet 40 mEq (40 mEq Oral Given 08/24/17 0541)  potassium chloride 10 mEq in 100 mL IVPB (0 mEq Intravenous Stopped 08/24/17 0656)  sodium chloride 0.9 % bolus 1,000 mL (0 mLs Intravenous Stopped 08/24/17 0657)  ketorolac (TORADOL) 30 MG/ML injection 30 mg (30 mg Intravenous Given 08/24/17 0548)  ondansetron (ZOFRAN) injection 4 mg (4 mg Intravenous Given 08/24/17 0547)  sodium chloride 0.9 % bolus 1,000 mL (0 mLs Intravenous Stopped 08/24/17 1016)  acetaminophen  (TYLENOL) tablet 650 mg (650 mg Oral Given 08/24/17 0907)  oxyCODONE (Oxy IR/ROXICODONE) immediate release tablet 5 mg (5 mg Oral Given 08/24/17 0908)  AEROCHAMBER PLUS FLO-VU MEDIUM MISC 1 each (1 each Other Given 08/24/17 0900)  ipratropium-albuterol (DUONEB) 0.5-2.5 (3) MG/3ML nebulizer solution 3 mL (3 mLs Nebulization Given 08/24/17 1030)     Initial Impression / Assessment and Plan / ED Course  I have reviewed the triage vital signs and the nursing notes.  Pertinent labs & imaging results that were available during my care of the patient were reviewed by me and considered in my medical decision making (see chart for details).     Patient presents with flulike symptoms.  Reports fevers at home.  Is overall nontoxic appearing.  She is tachycardic.  Exam is benign.  Patient given fluids.  Lab work notable for mild leukocytosis and hypokalemia with potassium of 2.9.  Chest x-ray shows no evidence of pneumonia.  Patient was given 1 L fluids and Toradol for body aches.  On recheck, she continues to complain of myalgias.  Heart rate is in the 110s.  Repeat fluids ordered.  Suspect influenza.  Will treat with Tamiflu and supportive measures.  After history, exam, and medical workup I feel the patient has been appropriately medically screened and is safe for discharge home. Pertinent diagnoses were discussed with the patient. Patient was given return precautions.   Final Clinical Impressions(s) / ED Diagnoses   Final diagnoses:  Influenza-like illness  Hypokalemia    ED  Discharge Orders        Ordered    oseltamivir (TAMIFLU) 75 MG capsule  Every 12 hours     08/24/17 0752    potassium chloride SA (K-DUR,KLOR-CON) 20 MEQ tablet  2 times daily     08/24/17 0753       Kalecia Hartney, Barbette Hair, MD 08/25/17 1056

## 2017-08-24 NOTE — ED Notes (Signed)
Walked patient to the bathroom patient did well 

## 2017-08-25 ENCOUNTER — Telehealth: Payer: Self-pay | Admitting: *Deleted

## 2017-08-25 NOTE — Telephone Encounter (Signed)
This RN spoke with pt per her call with Alessandra stating she had to go to the ER yesterday due to severe SOB,fever with myalgias - noted pt had low potassium and was given IVF and IV potassium.  Today she states she continues to be SOB and is concerned that she could have pneumonia " like last year ".  This RN verified with Latoshia use of current medications including tamiflu and inhalers which Natalin states she is using.  She is hydrating herself as best as she can including use of gatorade.  This RN reviewed xray with pt - with normal readings.  Discussed possible onset of flu which has to run it course but can be greatly benefited with use of tamiflu.  Per discussion Keondra stated understanding to go to the ER or an Urgent care if her symptoms worsen.

## 2017-08-31 ENCOUNTER — Other Ambulatory Visit: Payer: Medicare Other

## 2017-08-31 ENCOUNTER — Encounter: Payer: Medicare Other | Admitting: Genetic Counselor

## 2017-09-12 IMAGING — CR DG KNEE 1-2V*L*
2 series · 2 of 2 positions shown · non-contrast
Comparison: None.

CLINICAL DATA: Bilateral knee pain

EXAM:
LEFT KNEE - 1-2 VIEW

[w knee ap left]
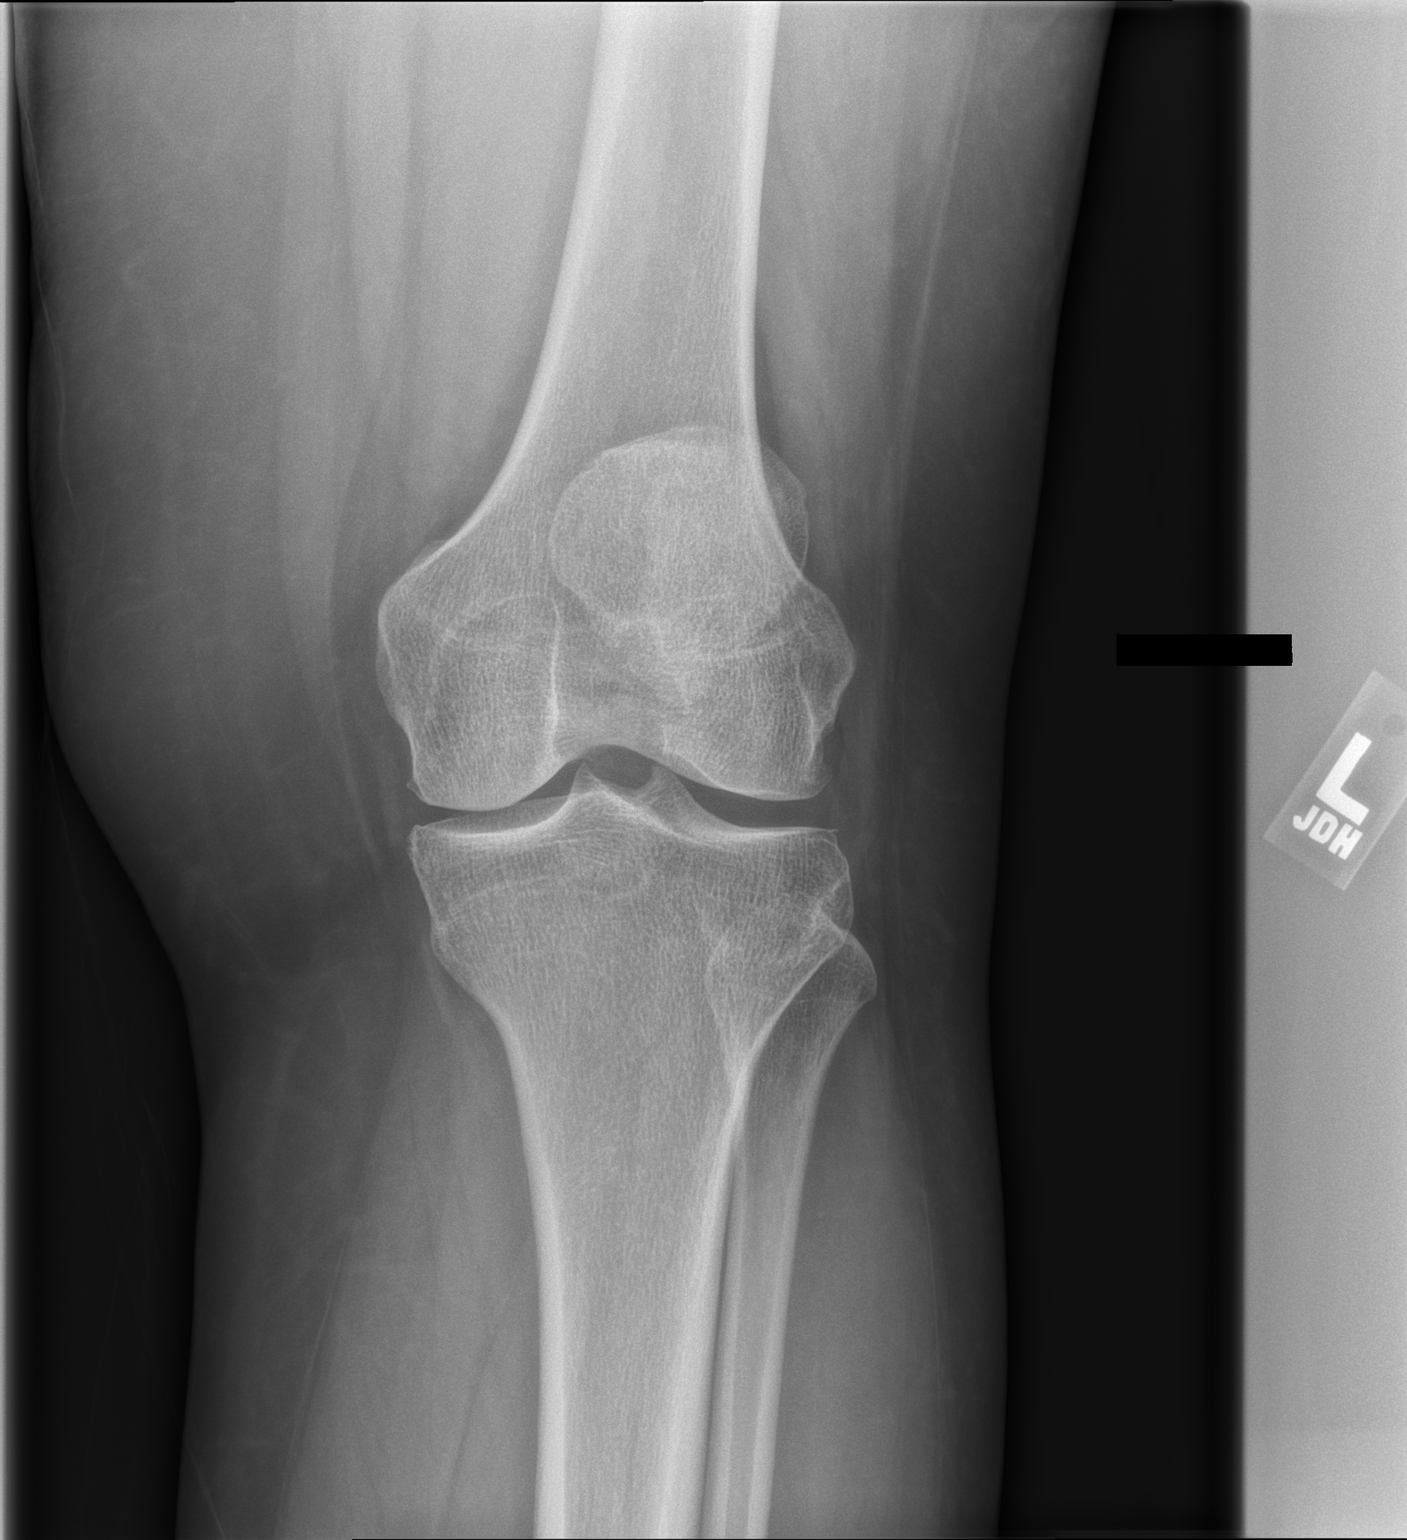

[w knee lat left]
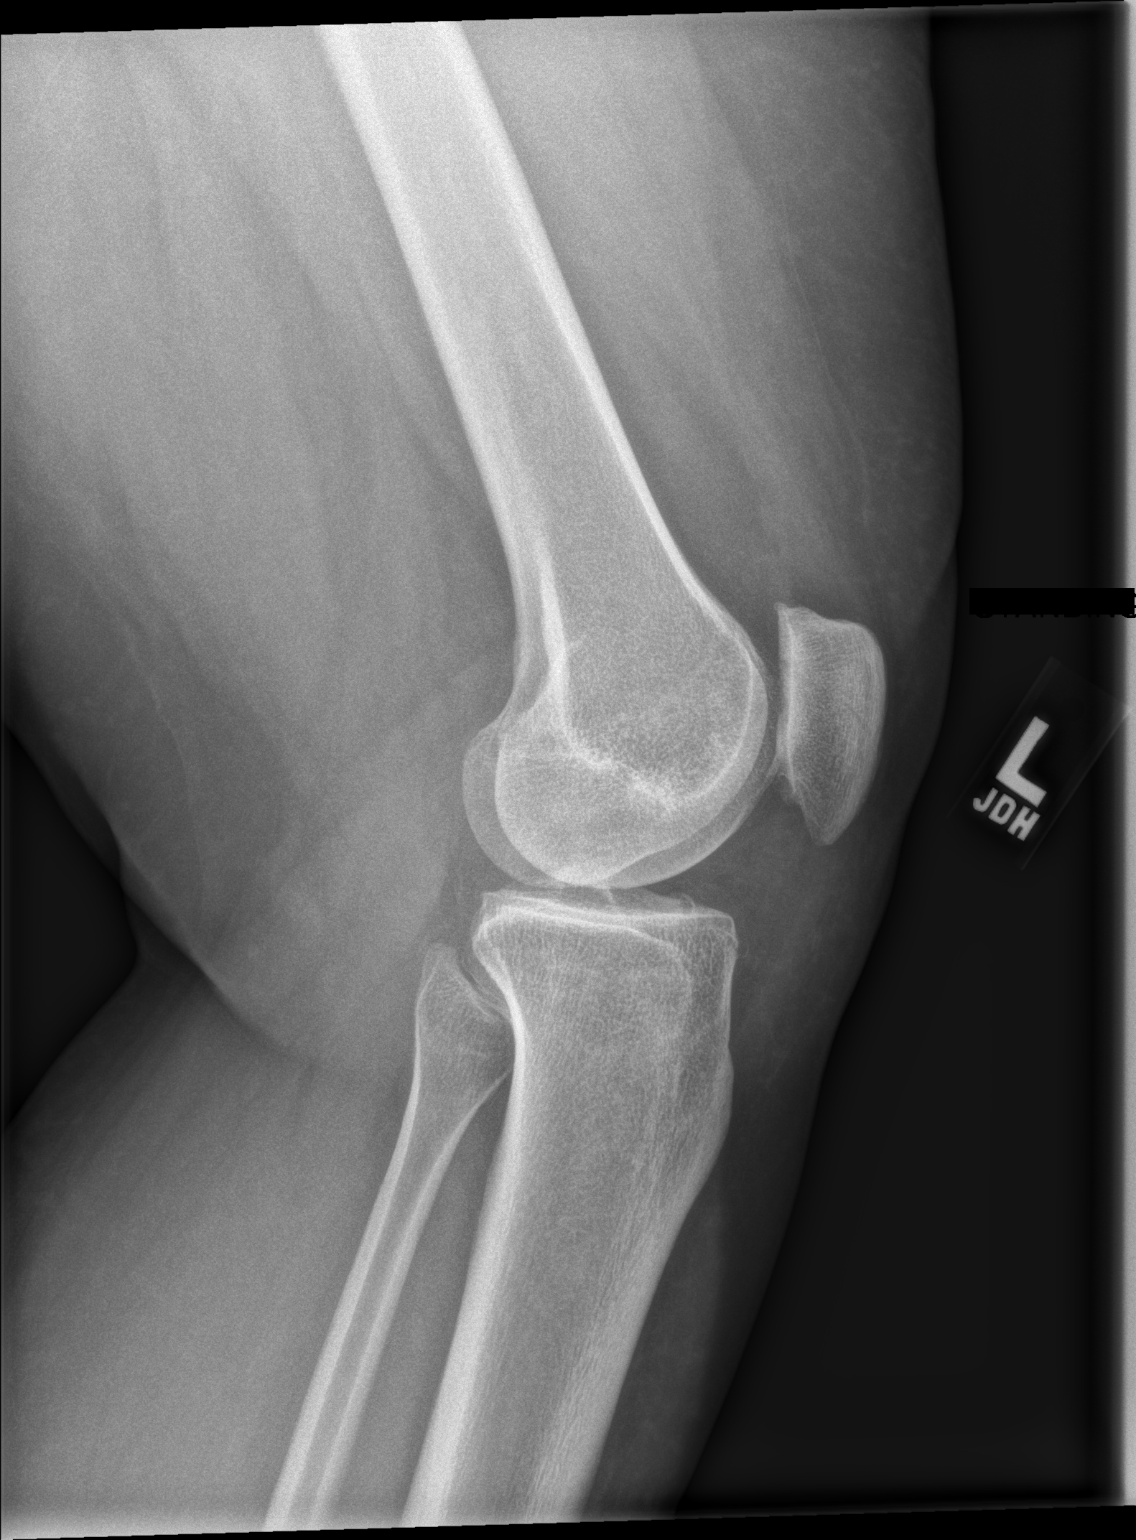

[2 of 2 positions shown; findings below may reference images not displayed]

FINDINGS: Mild degenerative changes are noted in the medial joint space and
patellofemoral space. No sizable joint effusion is seen. No other
focal abnormality is noted.
IMPRESSION: Mild degenerative change without acute abnormality.

## 2017-09-12 IMAGING — CR DG KNEE 1-2V*R*
2 series · 2 of 2 positions shown · non-contrast
Comparison: None.

CLINICAL DATA: Chronic knee pain

EXAM:
RIGHT KNEE - 2 VIEW

[w knee ap right]
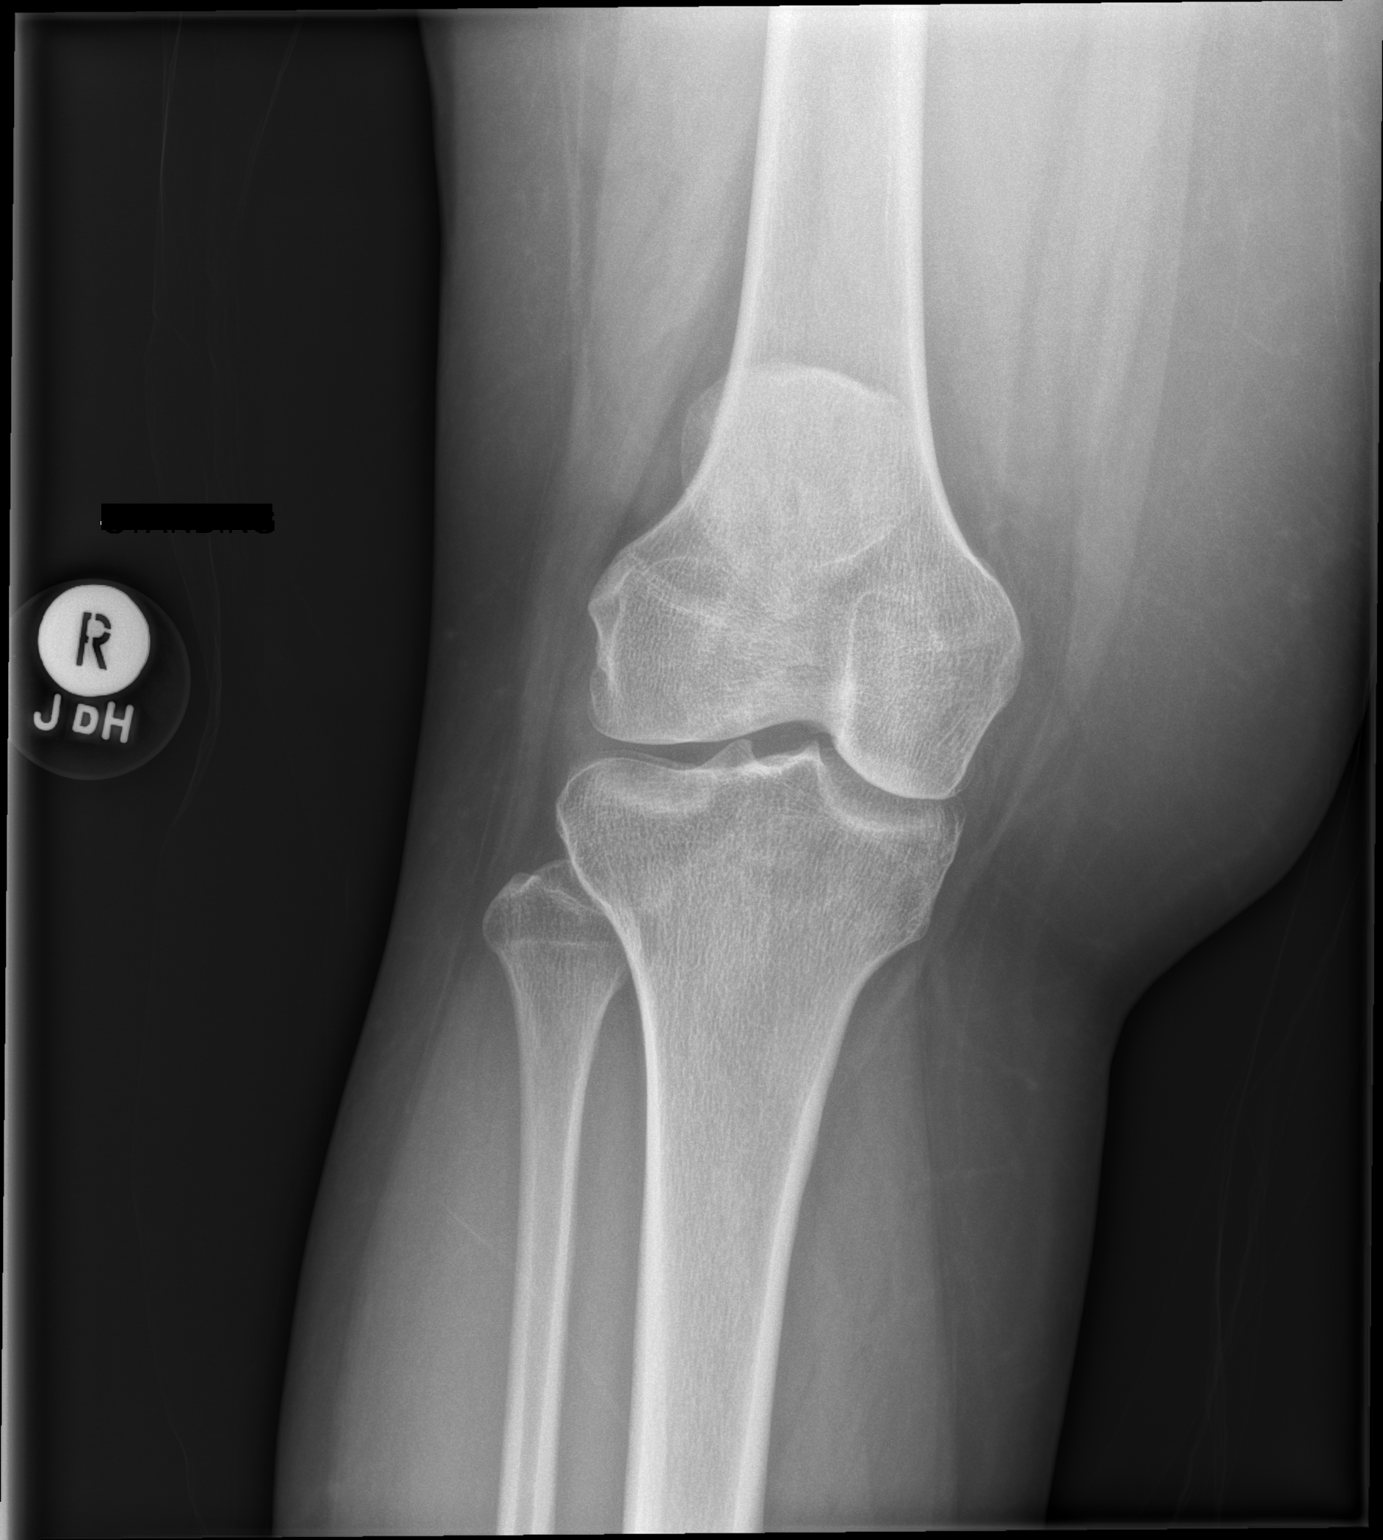

[w knee lat right]
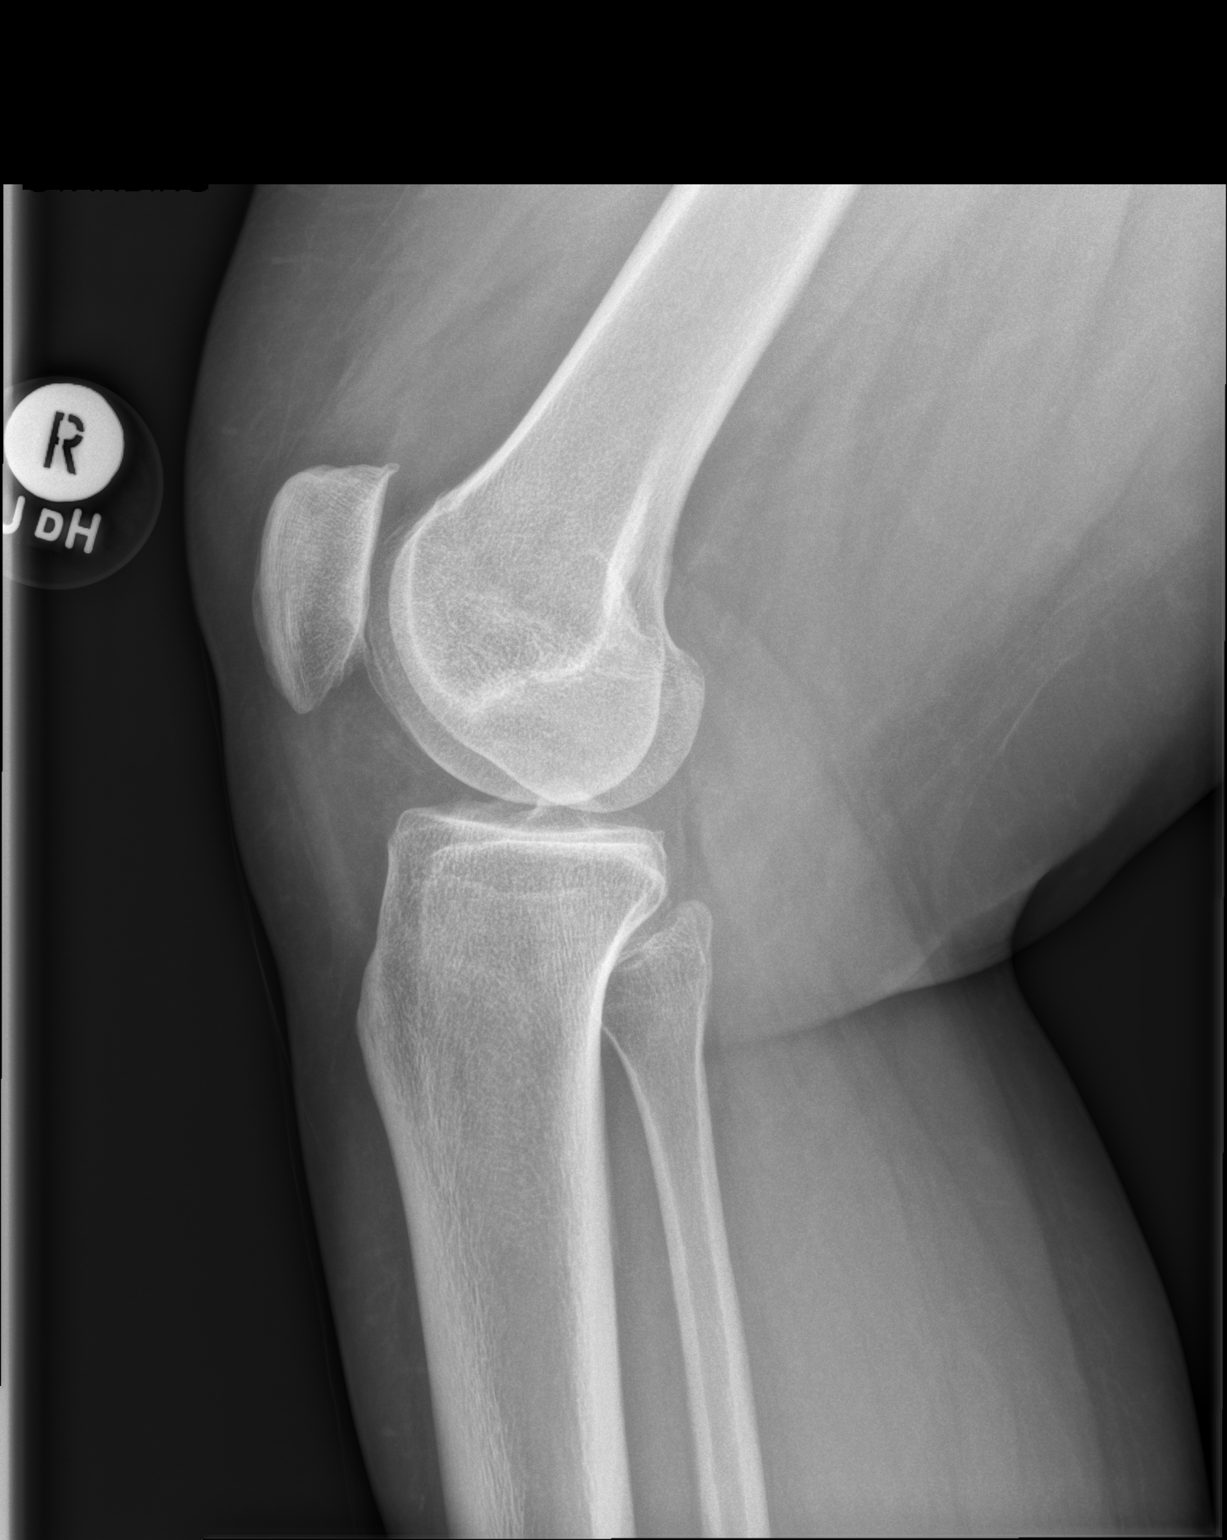

[2 of 2 positions shown; findings below may reference images not displayed]

FINDINGS: Mild degenerative changes are noted in the patellofemoral space. No
acute fracture or dislocation is noted. Small joint effusion is
noted.
IMPRESSION: Mild degenerative change.

## 2017-09-13 ENCOUNTER — Telehealth: Payer: Self-pay

## 2017-09-13 ENCOUNTER — Inpatient Hospital Stay: Payer: Medicare Other | Attending: Oncology

## 2017-09-13 ENCOUNTER — Inpatient Hospital Stay: Payer: Medicare Other

## 2017-09-13 ENCOUNTER — Inpatient Hospital Stay (HOSPITAL_BASED_OUTPATIENT_CLINIC_OR_DEPARTMENT_OTHER): Payer: Medicare Other | Admitting: Medical

## 2017-09-13 ENCOUNTER — Other Ambulatory Visit: Payer: Self-pay

## 2017-09-13 VITALS — BP 110/72 | HR 87 | Temp 98.2°F | Resp 18 | Ht 64.0 in | Wt 233.7 lb

## 2017-09-13 VITALS — BP 135/82 | HR 108 | Temp 98.1°F | Resp 20

## 2017-09-13 DIAGNOSIS — Z9221 Personal history of antineoplastic chemotherapy: Secondary | ICD-10-CM | POA: Diagnosis not present

## 2017-09-13 DIAGNOSIS — I890 Lymphedema, not elsewhere classified: Secondary | ICD-10-CM | POA: Insufficient documentation

## 2017-09-13 DIAGNOSIS — K5900 Constipation, unspecified: Secondary | ICD-10-CM | POA: Diagnosis not present

## 2017-09-13 DIAGNOSIS — Z9011 Acquired absence of right breast and nipple: Secondary | ICD-10-CM | POA: Insufficient documentation

## 2017-09-13 DIAGNOSIS — Z8042 Family history of malignant neoplasm of prostate: Secondary | ICD-10-CM | POA: Insufficient documentation

## 2017-09-13 DIAGNOSIS — C50811 Malignant neoplasm of overlapping sites of right female breast: Secondary | ICD-10-CM

## 2017-09-13 DIAGNOSIS — I10 Essential (primary) hypertension: Secondary | ICD-10-CM

## 2017-09-13 DIAGNOSIS — Z923 Personal history of irradiation: Secondary | ICD-10-CM

## 2017-09-13 DIAGNOSIS — Z170 Estrogen receptor positive status [ER+]: Secondary | ICD-10-CM

## 2017-09-13 DIAGNOSIS — R197 Diarrhea, unspecified: Secondary | ICD-10-CM

## 2017-09-13 DIAGNOSIS — Z79811 Long term (current) use of aromatase inhibitors: Secondary | ICD-10-CM

## 2017-09-13 DIAGNOSIS — Z5111 Encounter for antineoplastic chemotherapy: Secondary | ICD-10-CM | POA: Diagnosis not present

## 2017-09-13 DIAGNOSIS — Z87891 Personal history of nicotine dependence: Secondary | ICD-10-CM

## 2017-09-13 DIAGNOSIS — F418 Other specified anxiety disorders: Secondary | ICD-10-CM

## 2017-09-13 DIAGNOSIS — E876 Hypokalemia: Secondary | ICD-10-CM

## 2017-09-13 DIAGNOSIS — J0110 Acute frontal sinusitis, unspecified: Secondary | ICD-10-CM

## 2017-09-13 DIAGNOSIS — C50011 Malignant neoplasm of nipple and areola, right female breast: Secondary | ICD-10-CM

## 2017-09-13 DIAGNOSIS — R5383 Other fatigue: Secondary | ICD-10-CM | POA: Diagnosis not present

## 2017-09-13 DIAGNOSIS — K5909 Other constipation: Secondary | ICD-10-CM

## 2017-09-13 LAB — CMP (CANCER CENTER ONLY)
ALBUMIN: 3.7 g/dL (ref 3.5–5.0)
ALK PHOS: 91 U/L (ref 40–150)
ALT: 9 U/L (ref 0–55)
ANION GAP: 9 (ref 3–11)
AST: 9 U/L (ref 5–34)
BUN: 7 mg/dL (ref 7–26)
CO2: 23 mmol/L (ref 22–29)
Calcium: 9.6 mg/dL (ref 8.4–10.4)
Chloride: 111 mmol/L — ABNORMAL HIGH (ref 98–109)
Creatinine: 0.8 mg/dL (ref 0.60–1.10)
GFR, Estimated: >60 mL/min (ref >60)
GLUCOSE: 87 mg/dL (ref 70–140)
POTASSIUM: 3.3 mmol/L — AB (ref 3.5–5.1)
SODIUM: 143 mmol/L (ref 136–145)
Total Bilirubin: 0.3 mg/dL (ref 0.2–1.2)
Total Protein: 7.7 g/dL (ref 6.4–8.3)

## 2017-09-13 LAB — CBC WITH DIFFERENTIAL (CANCER CENTER ONLY)
BASOS PCT: 0 %
Basophils Absolute: 0 10*3/uL (ref 0.0–0.1)
EOS PCT: 6 %
Eosinophils Absolute: 0.6 10*3/uL — ABNORMAL HIGH (ref 0.0–0.5)
HCT: 40.1 % (ref 34.8–46.6)
HEMOGLOBIN: 13.1 g/dL (ref 11.6–15.9)
LYMPHS ABS: 4.3 10*3/uL — AB (ref 0.9–3.3)
Lymphocytes Relative: 48 %
MCH: 28.2 pg (ref 25.1–34.0)
MCHC: 32.7 g/dL (ref 31.5–36.0)
MCV: 86.4 fL (ref 79.5–101.0)
MONO ABS: 0.4 10*3/uL (ref 0.1–0.9)
MONOS PCT: 4 %
NEUTROS PCT: 42 %
Neutro Abs: 3.8 10*3/uL (ref 1.5–6.5)
PLATELETS: 309 10*3/uL (ref 145–400)
RBC: 4.64 MIL/uL (ref 3.70–5.45)
RDW: 14.5 % (ref 11.2–14.5)
WBC Count: 9 10*3/uL (ref 3.9–10.3)

## 2017-09-13 MED ORDER — GOSERELIN ACETATE 3.6 MG ~~LOC~~ IMPL
3.6000 mg | DRUG_IMPLANT | Freq: Once | SUBCUTANEOUS | Status: AC
  Administered 2017-09-13: 3.6 mg via SUBCUTANEOUS

## 2017-09-13 MED ORDER — SULFAMETHOXAZOLE-TRIMETHOPRIM 800-160 MG PO TABS: 1.0000 | ORAL_TABLET | Freq: Two times a day (BID) | ORAL | 0 refills | Status: DC

## 2017-09-13 MED ORDER — GOSERELIN ACETATE 3.6 MG ~~LOC~~ IMPL
DRUG_IMPLANT | SUBCUTANEOUS | Status: AC
  Filled 2017-09-13: qty 3.6

## 2017-09-13 NOTE — Progress Notes (Signed)
Pt reports that after recent flu she hasn't had the desire to eat, is extremely tired, and is achy.  Denies N/V/D, chills/feve, or cough/SOB.

## 2017-09-13 NOTE — Patient Instructions (Signed)
Preventing Influenza, Adult Influenza, more commonly known as "the flu," is a viral infection that mainly affects the respiratory tract. The respiratory tract includes structures that help you breathe, such as the lungs, nose, and throat. The flu causes many common cold symptoms, as well as a high fever and body aches. The flu spreads easily from [place] to [place] (is contagious). The flu is most common from December through March. This is called flu season.You can catch the flu virus by:  Breathing in droplets from an infected [place]'s cough or sneeze.  Touching something that was recently contaminated with the virus and then touching your mouth, nose, or eyes.  What can I do to lower my risk? You can decrease your risk of getting the flu by:  Getting a flu shot (influenza vaccination) every year. This is the best way to prevent the flu. A flu shot is recommended for everyone age 6 months and older. ? It is best to get a flu shot in the fall, as soon as it is available. Getting a flu shot during winter or spring instead is still a good idea. Flu season can last into early spring. ? Preventing the flu through vaccination requires getting a new flu shot every year. This is because the flu virus changes slightly (mutates) from one year to the next. Even if a flu shot does not completely protect you from all flu virus mutations, it can reduce the severity of your illness and prevent dangerous complications of the flu. ? If you are pregnant, you can and should get a flu shot. ? If you have had a reaction to the shot in the past or if you are allergic to eggs, check with your health care provider before getting a flu shot. ? Sometimes the vaccine is available as a nasal spray. In some years, the nasal spray has not been as effective against the flu virus. Check with your health care provider if you have questions about this.  Practicing good health habits. This is especially important during flu  season. ? Avoid contact with people who are sick with flu or cold symptoms. ? Wash your hands with soap and water often. If soap and water are not available, use hand sanitizer. ? Avoid touching your hands to your face, especially when you have not washed your hands recently. ? Use a disinfectant to clean surfaces at home and at work that may be contaminated with the flu virus. ? Keep your body's disease-fighting system (immune system) in good shape by eating a healthy diet, drinking plenty of fluids, getting enough sleep, and exercising regularly.  If you do get the flu, avoid spreading it to others by:  Staying home until your symptoms have been gone for at least one day.  Covering your mouth and nose with your elbow when you cough or sneeze.  Avoiding close contact with others, especially babies and elderly people.  Why are these changes important? Getting a flu shot and practicing good health habits protects you as well as other people. If you get the flu, your friends, family, and co-workers are also at risk of getting it, because it spreads so easily to others. Each year, about 2 out of every 10 people get the flu. Having the flu can lead to complications, such as pneumonia, ear infection, and sinus infection. The flu also can be deadly, especially for babies, people older than age 65, and people who have serious long-term diseases. How is this treated? Most people recover   from the flu by resting at home and drinking plenty of fluids. However, a prescription antiviral medicine may reduce your flu symptoms and may make your flu go away sooner. This medicine must be started within a few days of getting flu symptoms. You can talk with your health care provider about whether you need an antiviral medicine. Antiviral medicine may be prescribed for people who are at risk for more serious flu symptoms. This includes people who:  Are older than age 65.  Are pregnant.  Have a condition that  makes the flu worse or more dangerous.  Where to find more information:  Centers for Disease Control and Prevention: www.cdc.gov/flu/index.htm  Flu.gov: www.flu.gov/prevention-vaccination  American Academy of Family Physicians: familydoctor.org/familydoctor/en/kids/vaccines/preventing-the-flu.html Contact a health care provider if:  You have influenza and you develop new symptoms.  You have: ? Chest pain. ? Diarrhea. ? A fever.  Your cough gets worse, or you produce more mucus. Summary  The best way to prevent the flu is to get a flu shot every year in the fall.  Even if you get the flu after you have received the yearly vaccine, your flu may be milder and go away sooner because of your flu shot.  If you get the flu, antiviral medicines that are started with a few days of symptoms may reduce your flu symptoms and may make your flu go away sooner.  You can also help prevent the flu by practicing good health habits. This information is not intended to replace advice given to you by your health care provider. Make sure you discuss any questions you have with your health care provider. Document Released: 06/08/2015 Document Revised: 01/31/2016 Document Reviewed: 01/31/2016 Elsevier Interactive Patient Education  2018 [employer] Inc.  

## 2017-09-13 NOTE — Telephone Encounter (Addendum)
Pt in lobby, completed her Zoladex injection and reports she had the flu last week and her potassium was 2.9.  She would like to know when should she have labs repeated.  Per Dr Jana Hakim, pt should have lab appt for next month with injection and pt to continue taking her Potassium as previously directed.  Pt voiced understanding.   Went out to tell pt in lobby and she then reported she has no appetite and feels weak and would like to be seen. Pt noted with steady gait, walked over to lab. Notified Sandi Mealy PA in symptom management, Appt made for now as well as labs per [facility].

## 2017-09-14 NOTE — Progress Notes (Signed)
[facility] Progress Note   Karen Hopkins [id number] 23-Apr-1976 42 y.o.  Karen Hopkins is managed by Dr. Jana Hakim  Actively treated with chemotherapy: no  Current Therapy: Goserelin  Last Treated: 09/13/2017  Assessment: Plan:    Acute frontal sinusitis, recurrence not specified - Plan: sulfamethoxazole-trimethoprim (BACTRIM DS,SEPTRA DS) 800-160 MG tablet  Other constipation  Malignant neoplasm of overlapping sites of right breast in female, estrogen receptor positive ([place])  Hypokalemia   Acute frontal sinusitis: The patient was given a prescription for Bactrim DS, 1 p.o. twice daily times 10 days.  Constipation: The patient has been instructed to increase her stool softeners to 1 to 2 capsules p.o. twice daily and to use MiraLAX as needed.  ER positive malignant neoplasm of the right breast.  The patient continues with monthly goserelin injections along with daily anastrozole.  She will see Dr. Jana Hakim in follow-up on 03/28/2018.  Hypokalemia: Labs returned today showing that the patient's potassium was improved at 3.3.  This was up from 2.9 when last checked on 08/24/2017.  She was instructed to continue potassium chloride 20 mEq twice daily.  She is scheduled to have labs rechecked on 10/10/2017.  Please see After Visit Summary for patient specific instructions.  Future Appointments  Date Time Provider [place]  09/19/2017 11:40 AM Bayard Hugger, NP [facility] CPR  10/10/2017 11:00 AM [facility] LAB 6 [facility] None  10/10/2017 11:30 AM [facility] FLUSH NURSE [facility] None  10/11/2017 11:30 AM [facility] PAIN REHAB [facility] None  10/11/2017 11:45 AM Kirsteins, Luanna Salk, MD [facility] None  11/01/2017 11:00 AM Melvenia Beam, MD [facility] None  11/08/2017 11:30 AM [facility] INJ NURSE [facility] None  12/06/2017 11:30 AM [facility] FLUSH NURSE 2 [facility] None  01/03/2018 11:30 AM [facility] INJ NURSE [facility] None  01/31/2018 11:30 AM  [facility] INJ NURSE [facility] None  02/28/2018 11:30 AM [facility] INJ NURSE [facility] None  03/28/2018 11:00 AM [facility] LAB ONLY [facility] None  03/28/2018 11:30 AM Magrinat, Virgie Dad, MD [facility] None  03/28/2018 12:15 PM [facility] FLUSH NURSE 2 [facility] None  07/17/2018  1:00 PM Megan Salon, MD [place] None    No orders of the defined types were placed in this encounter.      Subjective:   Patient ID:  Karen Hopkins is a 42 y.o. (DOB 1976/04/15) female.  Chief Complaint:  Chief Complaint  Patient presents with  . Fatigue    HPI Karen Hopkins is a 42 year old female with a locally advanced right breast cancer who is treated with anastrozole and goserelin.  She presented to the office today for her goserelin shot.  She was diagnosed with the flu on 08/24/2017.  She is also noted to have hypokalemia.  She is taking oral potassium.  She is having anorexia, fatigue, facial pain, postnasal drainage, and myalgias.  She has a history of constipation which fluctuates to diarrhea after she takes MiraLAX.  She denies nausea, vomiting, cough, or shortness of breath.  A CBC collected today returned with a WBC of 9, hemoglobin of 13.1, hematocrit 40.1 and platelet count of 309.  Her [place] returned at 3.8.    Medications: I have reviewed the patient's current medications.  Allergies:  Allergies  Allergen Reactions  . Penicillins Swelling    FACIAL SWELLING  . Lyrica [Pregabalin] Nausea Only    .  Marland Kitchen Meloxicam Nausea Only  . Robaxin [Methocarbamol] Nausea Only    Past Medical History:  Diagnosis Date  . Anxiety   .  Cancer ([place]) 2011   Rt. Br. Ca  . Depression   . Essential hypertension 11/05/2015  . History of breast cancer 2011   right  . History of chemotherapy 2011  . History of radiation therapy 2011  . Hypertension    under control with med., has been on med. x 2 yr.  . Lymphedema of arm    right; no BP or puncture to right arm  . Personal history of  chemotherapy 09/16/2009   rt breast  . Personal history of radiation therapy 05/2010   rt breast  . Pneumonia   . Seasonal allergies   . Sinus headache     Past Surgical History:  Procedure Laterality Date  . BREAST BIOPSY Right 08/26/2009  . BREAST REDUCTION SURGERY Left 08/23/2013   Procedure: LEFT BREAST REDUCTION  ;  Surgeon: Theodoro Kos, DO;  Location: [place];  Service: Plastics;  Laterality: Left;  . CESAREAN SECTION  07/15/2001; 03/18/2004; 11/21/2008  . lateral orbiotomy Left 11/2015   [facility] at [facility]  . LATISSIMUS FLAP TO BREAST Right 03/12/2013   Procedure: RIGHT BREAST LATISSIMUS FLAP WITH EXPANDER PLACEMENT;  Surgeon: Theodoro Kos, DO;  Location: [place];  Service: Plastics;  Laterality: Right;  . LIPOSUCTION Bilateral 08/23/2013   Procedure: LIPOSUCTION;  Surgeon: Theodoro Kos, DO;  Location: [place];  Service: Plastics;  Laterality: Bilateral;  . MASTECTOMY Right 2011  . MODIFIED RADICAL MASTECTOMY Right 03/11/2010  . NASAL SEPTUM SURGERY    . PORT-A-CATH REMOVAL Left 12/01/2010  . PORTACATH PLACEMENT Left 09/12/2009  . REDUCTION MAMMAPLASTY Left 08/2013  . REMOVAL OF TISSUE EXPANDER AND PLACEMENT OF IMPLANT Right 08/23/2013   Procedure: REMOVAL RIGHT TISSUE EXPANDER AND PLACEMENT OF IMPLANT TO RIGHT BREAST ;  Surgeon: Theodoro Kos, DO;  Location: [place];  Service: Plastics;  Laterality: Right;  . SUPRA-UMBILICAL HERNIA  0932  . TUBAL LIGATION  11/21/2008    Family History  Problem Relation Age of Onset  . Hypertension Mother   . Breast cancer Mother 70  . Diabetes type II Father   . Prostate cancer Father   . Stroke Neg Hx     Social History   Socioeconomic History  . Marital status: Single    Spouse name: Not on file  . Number of children: 3  . Years of education: 55  . Highest education level: Not on file  Occupational History  . Occupation: Disability   Social Needs  . Financial resource strain:  Not on file  . Food insecurity:    Worry: Not on file    Inability: Not on file  . Transportation needs:    Medical: Not on file    Non-medical: Not on file  Tobacco Use  . Smoking status: Former Smoker    Packs/day: 0.25    Years: 5.00    Pack years: 1.25    Types: Cigarettes    Start date: 06/07/2002    Last attempt to quit: 04/26/2010    Years since quitting: 7.3  . Smokeless tobacco: Never Used  Substance and Sexual Activity  . Alcohol use: No  . Drug use: No  . Sexual activity: Not Currently    Birth control/protection: Surgical  Lifestyle  . Physical activity:    Days per week: Not on file    Minutes per session: Not on file  . Stress: Not on file  Relationships  . Social connections:    Talks on phone: Not on file  Gets together: Not on file    Attends religious service: Not on file    Active member of club or organization: Not on file    Attends meetings of clubs or organizations: Not on file    Relationship status: Not on file  . Intimate partner violence:    Fear of current or ex partner: Not on file    Emotionally abused: Not on file    Physically abused: Not on file    Forced sexual activity: Not on file  Other Topics Concern  . Not on file  Social History Narrative   Lives with kids   Caffeine use: none     Past Medical History, Surgical history, Social history, and Family history were reviewed and updated as appropriate.   Please see review of systems for further details on the patient's review from today.   Review of Systems:  Review of Systems  Constitutional: Positive for appetite change and fatigue. Negative for chills, diaphoresis and fever.  HENT: Positive for postnasal drip and sinus pain. Negative for congestion, rhinorrhea, sinus pressure, sneezing and sore throat.   Respiratory: Positive for cough. Negative for shortness of breath.   Gastrointestinal: Positive for constipation and diarrhea.  Musculoskeletal: Positive for myalgias.    Neurological: Negative for headaches.    Objective:   Physical Exam:  BP 110/72 (BP Location: Left Arm, Patient Position: Sitting)   Pulse 87   Temp 98.2 F (36.8 C) (Oral)   Resp 18   Ht 5\' 4"  (1.626 m)   Wt 233 lb 11.2 oz (106 kg)   LMP 07/22/2014 (Approximate)   SpO2 100%   BMI 40.11 kg/m  ECOG: 0  Physical Exam  Constitutional: No distress.  HENT:  Head: Normocephalic and atraumatic.  Nose: Right sinus exhibits frontal sinus tenderness. Right sinus exhibits no maxillary sinus tenderness. Left sinus exhibits frontal sinus tenderness. Left sinus exhibits no maxillary sinus tenderness.  Mouth/Throat: No oropharyngeal exudate.  Cardiovascular: Normal rate, regular rhythm and normal heart sounds. Exam reveals no gallop and no friction rub.  No murmur heard. Pulmonary/Chest: Effort normal and breath sounds normal. No respiratory distress. She has no wheezes. She has no rales.  Neurological: She is alert.  Skin: Skin is warm and dry. No rash noted. She is not diaphoretic. No erythema.    Lab Review:     Component Value Date/Time   NA 143 09/13/2017 1322   NA 141 05/25/2017 1140   K 3.3 (L) 09/13/2017 1322   K 3.3 (L) 05/25/2017 1140   CL 111 (H) 09/13/2017 1322   CL 105 09/27/2012 1044   CO2 23 09/13/2017 1322   CO2 25 05/25/2017 1140   GLUCOSE 87 09/13/2017 1322   GLUCOSE 96 05/25/2017 1140   GLUCOSE 118 (H) 09/27/2012 1044   BUN 7 09/13/2017 1322   BUN 9.1 05/25/2017 1140   CREATININE 0.80 09/13/2017 1322   CREATININE 0.8 05/25/2017 1140   CALCIUM 9.6 09/13/2017 1322   CALCIUM 9.3 05/25/2017 1140   PROT 7.7 09/13/2017 1322   PROT 7.9 05/25/2017 1140   ALBUMIN 3.7 09/13/2017 1322   ALBUMIN 3.9 05/25/2017 1140   AST 9 09/13/2017 1322   AST 9 05/25/2017 1140   ALT 9 09/13/2017 1322   ALT 11 05/25/2017 1140   ALKPHOS 91 09/13/2017 1322   ALKPHOS 113 05/25/2017 1140   BILITOT 0.3 09/13/2017 1322   BILITOT 0.23 05/25/2017 1140   GFRNONAA >60 09/13/2017 1322    GFRAA >60 09/13/2017 1322  Component Value Date/Time   WBC 9.0 09/13/2017 1322   WBC 10.8 (H) 08/24/2017 0420   RBC 4.64 09/13/2017 1322   HGB 13.5 08/24/2017 0420   HGB 13.0 05/25/2017 1140   HCT 40.1 09/13/2017 1322   HCT 40.1 05/25/2017 1140   PLT 309 09/13/2017 1322   PLT 291 05/25/2017 1140   PLT 360 09/17/2015 0817   MCV 86.4 09/13/2017 1322   MCV 85.3 05/25/2017 1140   MCH 28.2 09/13/2017 1322   MCHC 32.7 09/13/2017 1322   RDW 14.5 09/13/2017 1322   RDW 14.5 05/25/2017 1140   LYMPHSABS 4.3 (H) 09/13/2017 1322   LYMPHSABS 3.1 05/25/2017 1140   MONOABS 0.4 09/13/2017 1322   MONOABS 0.5 05/25/2017 1140   EOSABS 0.6 (H) 09/13/2017 1322   EOSABS 0.6 (H) 05/25/2017 1140   EOSABS 0.5 (H) 09/17/2015 0817   BASOSABS 0.0 09/13/2017 1322   BASOSABS 0.0 05/25/2017 1140   -------------------------------  Imaging from last 24 hours (if applicable):  Radiology interpretation: Dg Chest 2 View  Result Date: 08/24/2017 CLINICAL DATA:  Cough and fever for 1 day. EXAM: CHEST - 2 VIEW COMPARISON:  CT 03/29/2017.  Radiograph 08/03/2016 FINDINGS: The cardiomediastinal contours are unchanged, heart normal in size. Prominent epicardial fat pad accounting for opacity adjacent to the right heart border. Pulmonary vasculature is normal. No consolidation, pleural effusion, or pneumothorax. No acute osseous abnormalities are seen. Surgical clips in the right axilla. IMPRESSION: No acute pulmonary process. Electronically Signed   By: Jeb Levering M.D.   On: 08/24/2017 05:23

## 2017-09-19 ENCOUNTER — Encounter: Payer: Medicare Other | Admitting: Registered Nurse

## 2017-09-19 ENCOUNTER — Encounter: Payer: Self-pay | Admitting: Registered Nurse

## 2017-09-19 ENCOUNTER — Encounter: Payer: Medicare Other | Attending: Registered Nurse | Admitting: Registered Nurse

## 2017-09-19 VITALS — BP 116/80 | HR 85 | Ht 64.0 in | Wt 227.6 lb

## 2017-09-19 DIAGNOSIS — Z79899 Other long term (current) drug therapy: Secondary | ICD-10-CM

## 2017-09-19 DIAGNOSIS — M170 Bilateral primary osteoarthritis of knee: Secondary | ICD-10-CM

## 2017-09-19 DIAGNOSIS — F3289 Other specified depressive episodes: Secondary | ICD-10-CM

## 2017-09-19 DIAGNOSIS — Z5181 Encounter for therapeutic drug level monitoring: Secondary | ICD-10-CM | POA: Diagnosis not present

## 2017-09-19 DIAGNOSIS — G8929 Other chronic pain: Secondary | ICD-10-CM

## 2017-09-19 DIAGNOSIS — G894 Chronic pain syndrome: Secondary | ICD-10-CM

## 2017-09-19 DIAGNOSIS — I890 Lymphedema, not elsewhere classified: Secondary | ICD-10-CM

## 2017-09-19 DIAGNOSIS — M546 Pain in thoracic spine: Secondary | ICD-10-CM | POA: Diagnosis not present

## 2017-09-19 DIAGNOSIS — M25561 Pain in right knee: Secondary | ICD-10-CM | POA: Diagnosis not present

## 2017-09-19 DIAGNOSIS — M545 Low back pain, unspecified: Secondary | ICD-10-CM

## 2017-09-19 DIAGNOSIS — M542 Cervicalgia: Secondary | ICD-10-CM | POA: Diagnosis not present

## 2017-09-19 DIAGNOSIS — G243 Spasmodic torticollis: Secondary | ICD-10-CM

## 2017-09-19 DIAGNOSIS — G893 Neoplasm related pain (acute) (chronic): Secondary | ICD-10-CM | POA: Diagnosis not present

## 2017-09-19 DIAGNOSIS — M792 Neuralgia and neuritis, unspecified: Secondary | ICD-10-CM

## 2017-09-19 DIAGNOSIS — M25562 Pain in left knee: Secondary | ICD-10-CM

## 2017-09-19 MED ORDER — DICLOFENAC SODIUM 1 % TD GEL: 4.0000 g | Freq: Four times a day (QID) | TRANSDERMAL | 4 refills | Status: DC

## 2017-09-19 MED ORDER — GABAPENTIN 100 MG PO CAPS: 100.0000 mg | ORAL_CAPSULE | Freq: Three times a day (TID) | ORAL | 3 refills | Status: DC

## 2017-09-19 MED ORDER — OXYCODONE HCL 5 MG PO TABS: 5.0000 mg | ORAL_TABLET | Freq: Four times a day (QID) | ORAL | 0 refills | Status: DC | PRN

## 2017-09-19 NOTE — Progress Notes (Signed)
Subjective:    Patient ID: Karen Hopkins, female    DOB: Dec 14, 1975, 42 y.o.   MRN: [id number]  HPI: Karen Hopkins is a 42 year old female who returns for follow up appointment and medication refill. She states her pain is located in her neck mainly right side radiating into her right shoulder, lower back pain and bilateral neuropathic pain in her finger tips she reports. She rates her pain 7. Her current exercise regime is walking and performing ball therapy.  together Karen Hopkins Morphine Equivalent is 32.00 MME. She is also prescribed Alprazolam by Karen Hopkins. We have reviewed the black box warning of using opioids and benzodiazepines. I highlighted the dangers of using these drugs together and discussed the adverse events including respiratory suppression, overdose, cognitive impairment and importance of compliance with current regimen. She verbalizes understanding, we will continue to monitor and adjust as indicated.    Also states she was walking in her home three weeks ago when her right knee gave out, she fell forward. She was able to pick herself up, she didn't seek medical attention. Educated on [employer], she verbalizes understanding.   Karen Hopkins last UDS was Performed on 07/14/2017, it was consistent.   Pain Inventory Average Pain 8 Pain Right Now 7 My pain is constant, sharp, burning, dull, stabbing, tingling and aching  In the last 24 hours, has pain interfered with the following? General activity 0 Relation with others 0 Enjoyment of life 0 What TIME of day is your pain at its worst? night Sleep (in general) Poor  Pain is worse with: bending, inactivity, standing and some activites Pain improves with: rest, heat/ice, therapy/exercise, medication, TENS and injections Relief from Meds: .  Mobility ability to climb steps?  yes do you drive?  yes  Function disabled: date disabled .  Neuro/Psych bladder control problems numbness tingling trouble  walking spasms dizziness confusion depression anxiety  Prior Studies Any changes since last visit?  no  Physicians involved in your care Any changes since last visit?  no   Family History  Problem Relation Age of Onset  . Hypertension Mother   . Breast cancer Mother 51  . Diabetes type II Father   . Prostate cancer Father   . Stroke Neg Hx    Social History   Socioeconomic History  . Marital status: Single    Spouse name: Not on file  . Number of children: 3  . Years of education: 95  . Highest education level: Not on file  Occupational History  . Occupation: Disability   Social Needs  . Financial resource strain: Not on file  . Food insecurity:    Worry: Not on file    Inability: Not on file  . Transportation needs:    Medical: Not on file    Non-medical: Not on file  Tobacco Use  . Smoking status: Former Smoker    Packs/day: 0.25    Years: 5.00    Pack years: 1.25    Types: Cigarettes    Start date: 06/07/2002    Last attempt to quit: 04/26/2010    Years since quitting: 7.4  . Smokeless tobacco: Never Used  Substance and Sexual Activity  . Alcohol use: No  . Drug use: No  . Sexual activity: Not Currently    Birth control/protection: Surgical  Lifestyle  . Physical activity:    Days per week: Not on file    Minutes per session: Not on file  . Stress: Not  on file  Relationships  . Social connections:    Talks on phone: Not on file    Gets together: Not on file    Attends religious service: Not on file    Active member of club or organization: Not on file    Attends meetings of clubs or organizations: Not on file    Relationship status: Not on file  Other Topics Concern  . Not on file  Social History Narrative   Lives with kids   Caffeine use: none    Past Surgical History:  Procedure Laterality Date  . BREAST BIOPSY Right 08/26/2009  . BREAST REDUCTION SURGERY Left 08/23/2013   Procedure: LEFT BREAST REDUCTION  ;  Surgeon: Karen Kos, DO;   Location: [place];  Service: Plastics;  Laterality: Left;  . CESAREAN SECTION  07/15/2001; 03/18/2004; 11/21/2008  . lateral orbiotomy Left 11/2015   [facility] Toy Co[facility]key at [facility]  . LATISSIMUS FLAP TO BREAST Right 03/12/2013   Procedure: RIGHT BREAST LATISSIMUS FLAP WITH EXPANDER PLACEMENT;  Surgeon: Karen Kos, DO;  Location: [place];  Service: Plastics;  Laterality: Right;  . LIPOSUCTION Bilateral 08/23/2013   Procedure: LIPOSUCTION;  Surgeon: Karen Kos, DO;  Location: [place];  Service: Plastics;  Laterality: Bilateral;  . MASTECTOMY Right 2011  . MODIFIED RADICAL MASTECTOMY Right 03/11/2010  . NASAL SEPTUM SURGERY    . PORT-A-CATH REMOVAL Left 12/01/2010  . PORTACATH PLACEMENT Left 09/12/2009  . REDUCTION MAMMAPLASTY Left 08/2013  . REMOVAL OF TISSUE EXPANDER AND PLACEMENT OF IMPLANT Right 08/23/2013   Procedure: REMOVAL RIGHT TISSUE EXPANDER AND PLACEMENT OF IMPLANT TO RIGHT BREAST ;  Surgeon: Karen Kos, DO;  Location: [place];  Service: Plastics;  Laterality: Right;  . SUPRA-UMBILICAL HERNIA  1308  . TUBAL LIGATION  11/21/2008   Past Medical History:  Diagnosis Date  . Anxiety   . Cancer ([place]) 2011   Rt. Br. Ca  . Depression   . Essential hypertension 11/05/2015  . History of breast cancer 2011   right  . History of chemotherapy 2011  . History of radiation therapy 2011  . Hypertension    under control with med., has been on med. x 2 yr.  . Lymphedema of arm    right; no BP or puncture to right arm  . Personal history of chemotherapy 09/16/2009   rt breast  . Personal history of radiation therapy 05/2010   rt breast  . Pneumonia   . Seasonal allergies   . Sinus headache    BP 116/80   Pulse 85   Ht 5\' 4"  (1.626 m)   Wt 227 lb 9.6 oz (103.2 kg)   LMP 07/22/2014 (Approximate)   SpO2 98%   BMI 39.07 kg/m   Opioid Risk Score:   Fall Risk Score:  `1  Depression screen PHQ 2/9  Depression screen [facility] 2/9 08/23/2017  08/31/2016 07/22/2016 07/20/2016 07/08/2016 08/04/2015  Decreased Interest 1 0 0 0 0 0  Down, Depressed, Hopeless 1 0 0 0 0 0  PHQ - 2 Score 2 0 0 0 0 0  Some recent data might be hidden     Review of Systems  Constitutional: Positive for appetite change and unexpected weight change.  HENT: Negative.   Eyes: Negative.   Respiratory: Negative.   Cardiovascular: Negative.   Gastrointestinal: Positive for constipation and diarrhea.  Endocrine: Negative.   Genitourinary: Negative.   Musculoskeletal: Positive for arthralgias, gait problem and myalgias.  Skin: Negative.  Allergic/Immunologic: Negative.   Hematological: Negative.   Psychiatric/Behavioral: Negative.   All other systems reviewed and are negative.      Objective:   Physical Exam  Constitutional: She is oriented to [place], place, and time. She appears well-developed and well-nourished.  HENT:  Head: Normocephalic and atraumatic.  Neck: Normal range of motion. Neck supple.  Cardiovascular: Normal rate and regular rhythm.  Pulmonary/Chest: Effort normal and breath sounds normal.  Musculoskeletal:  Normal Muscle Bulk and Muscle Testing Reveals: Upper Extremities: Right: Decreased ROM 45 Degrees and Muscle Strength 4/5 Left: Full ROM and Muscle Strength 4/5 Right AC Joint Tenderness Thoracic Hypersensitivity: T-1-T-7 Arises from Table with ease Narrow Based Gait  Neurological: She is alert and oriented to [place], place, and time.  Skin: Skin is warm and dry.  Psychiatric: She has a normal mood and affect.  Nursing note and vitals reviewed.         Assessment & Plan:  1.Myofascial pain syndrome chronic postoperative, as well as post radiation: S/P Breast Reconstruction Surgery x 2. Continue: Physical Therapy  ( Dry Needling). 09/19/2017 Continue current medication regime: Refilled: Oxycodone 5 mg one tablet every 6 hours as needed for severe pain  #120.Continue HEP. We will continue the opioid monitoring program,  this consists of regular clinic visits, examinations, urine drug screen, pill counts as well as use of [place] Controlled Substance Reporting System. 2. Neoplasm Related Pain: Continue Current Medication Regime. 09/19/2017 3. Lymphedema of Right Upper Extremity: Oncology Following. 09/19/2017 4. Cervicalgia/Cervical Dystonia: Schedule for  Botox Injection with Dr. Letta Pate next month. 09/19/2017 5. Obesity: Continue with Healthy Diet and Exercise Regime. 09/19/2017 6. Muscle Spasms: Continue current medication regime Tizanidine. 09/19/2017 7. Depression: Continue Cymbalta and Counseling at [place]. 09/19/2017 8.Chronic Bilateral Knee Pai/ OA: Continue Voltaren Gel. 09/19/2017 9.Chronic Midline/ Low Back Bain/ Bilateral Low Back Pain: Continue with HEP as Tolerated. Continue current medication regimen. 09/19/2017 10. Neuropathic Pain: RX: Gabapentin:   20 minutes of face to face patient care time was spent during this visit. All questions were encouraged and answered.   F/U in 1 month

## 2017-09-19 NOTE — Patient Instructions (Signed)
Start Gabapentin Tonight: Only Take at Bedtime for three nights, if Pain persists  On Thursday you may increase to one tablet in the morning and at Bedtime, if pain persists   after three days you may increase Gabapentin to one capsule three times a day  Call office with any questions.

## 2017-09-29 ENCOUNTER — Telehealth: Payer: Self-pay

## 2017-09-29 NOTE — Telephone Encounter (Signed)
Received VM from pt requesting call back, didn't specify reason.  Returned pt's call, pt requested a call back from [facility] RN or Dr Jana Hakim in regards to concern about her own mother who has stage 4 breast cancer.  Her mother lives out of town and does not receive care here. Explained to her Val RN is off today and Dr Jana Hakim is in clinic but I will give them a message from her. No other needs per pt at this time.

## 2017-10-03 ENCOUNTER — Other Ambulatory Visit: Payer: Self-pay | Admitting: Family Medicine

## 2017-10-06 ENCOUNTER — Encounter: Payer: Self-pay | Admitting: General Practice

## 2017-10-06 ENCOUNTER — Telehealth: Payer: Self-pay | Admitting: *Deleted

## 2017-10-06 NOTE — Telephone Encounter (Signed)
This RN spoke with Karen Hopkins per her calls - her concerns are regarding her mother who has been diagnosed with stage 4 breast cancer and lives in the [place] area.  Her mother, Karen Hopkins, is currently an inpt at [facility] " they are radiating her whole head " .  Karen Hopkins stated concerns regarding her mother's current status and care being provided- " they are not giving her IVF but she is not drinking or eating "  " we don't really understand what is happening and was hoping Dr Jana Hakim could look at her records and help [place] know what to do "  This RN validated Karen Hopkins's concerns and per inquiry - Karen Hopkins states she knows " mom's cancer is in several places in her bones and skull but they are having to radiate her whole head because the spot where the cancer is in is hard to get to"  " she needs radiation to her back and hip "  " and she has like clusters of bumps on her breast but they are not doing anything for them "  [name] could not confirm that the cancer is in her mother's brain " they said skull ".  Karen Hopkins states her mother is very physically debilitated and unable to get out of bed and mentally " she is confused ".  Her mother does not have a HCPOA designated- and [name] states her sister Karen Hopkins is the oldest sibling. Karen Hopkins lives in the same area as her mother.  Karen Hopkins feels overwhelmed with above and concerns regarding the outcome of her mother and how she and her sister can help. Both Karen Hopkins and her sister are on her mother's HIPPA release.  " I feel like we should bring her to [facility] where she can get better care "  Per phone discussion with this RN- recommendations given for Karen Hopkins and her sister Karen Hopkins ) to arrange a time to speak with the doctor and team to ask very direct questions including prognosis and plan of care as well as to be able to inform the doctor of their concerns.  Informed Karen Hopkins currently she may feel like her mother's care is not appropriate  due to not knowing exactly what is going on- and possibly transferring her mother's care may not be in her best interest.  This RN again validated [name]'s concerns and support given relating to what she is describing as her mother's very serious health status at this time.  Karen Hopkins is still hoping Dr Jana Hakim can review her records and will try to have them faxed to this RN ( direct fax number given ).  This note will be forwarded to MD and support staff for communication of Karen Hopkins's concern for possible resources.

## 2017-10-06 NOTE — Progress Notes (Signed)
[place] Spiritual Care Note  Followed up with Hermie by phone per referral from [facility] Dodd/RN. Provided empathic listening, emotional support,and helped pt identify priority tasks in order to reduce distress and facilitate taking action. Plan to f/u by phone next week for further support and encouragement.   [place], [place], [facility] Pager [phone] Voicemail [phone]

## 2017-10-10 ENCOUNTER — Inpatient Hospital Stay: Payer: Medicare Other | Attending: Oncology

## 2017-10-10 ENCOUNTER — Inpatient Hospital Stay: Payer: Medicare Other

## 2017-10-10 DIAGNOSIS — Z170 Estrogen receptor positive status [ER+]: Secondary | ICD-10-CM | POA: Insufficient documentation

## 2017-10-10 DIAGNOSIS — Z5111 Encounter for antineoplastic chemotherapy: Secondary | ICD-10-CM | POA: Insufficient documentation

## 2017-10-10 DIAGNOSIS — C50011 Malignant neoplasm of nipple and areola, right female breast: Secondary | ICD-10-CM | POA: Insufficient documentation

## 2017-10-10 MED ORDER — GOSERELIN ACETATE 3.6 MG ~~LOC~~ IMPL
DRUG_IMPLANT | SUBCUTANEOUS | Status: AC
  Filled 2017-10-10: qty 3.6

## 2017-10-11 ENCOUNTER — Encounter: Payer: Self-pay | Admitting: Physical Medicine & Rehabilitation

## 2017-10-11 ENCOUNTER — Ambulatory Visit (HOSPITAL_BASED_OUTPATIENT_CLINIC_OR_DEPARTMENT_OTHER): Payer: Medicare Other | Admitting: Physical Medicine & Rehabilitation

## 2017-10-11 ENCOUNTER — Encounter: Payer: Medicare Other | Attending: Registered Nurse

## 2017-10-11 ENCOUNTER — Other Ambulatory Visit: Payer: Self-pay | Admitting: Family Medicine

## 2017-10-11 VITALS — BP 120/81 | HR 98 | Ht 63.0 in | Wt 225.0 lb

## 2017-10-11 DIAGNOSIS — I890 Lymphedema, not elsewhere classified: Secondary | ICD-10-CM | POA: Insufficient documentation

## 2017-10-11 DIAGNOSIS — G243 Spasmodic torticollis: Secondary | ICD-10-CM | POA: Diagnosis not present

## 2017-10-11 DIAGNOSIS — M546 Pain in thoracic spine: Secondary | ICD-10-CM | POA: Diagnosis not present

## 2017-10-11 MED ORDER — OXYCODONE HCL 5 MG PO TABS: 5.0000 mg | ORAL_TABLET | Freq: Four times a day (QID) | ORAL | 0 refills | Status: DC | PRN

## 2017-10-11 NOTE — Patient Instructions (Signed)
AbobotulinumtoxinA injection What is this medicine? ABOBOTULINUMTOXINA (ay boh BOT yoo li num TOX in A) is a neuro-muscular blocker. This medicine is used to treat severe neck muscle spasms. It is also used to treat muscle spasms in the elbow, wrist, and finger muscles in adults and in the calf muscles in children. It is also used to treat frown lines or lines between the eyebrows on the face. This medicine may be used for other purposes; ask your health care provider or pharmacist if you have questions. COMMON BRAND NAME(S): Dysport What should I tell my health care provider before I take this medicine? They need to know if you have any of these conditions: -breathing problems -diabetes -heart problems -history of surgery where this medicine is going to be used -infection where this medicine is going to be used -myasthenia gravis or other neurologic disease -nerve or muscle disease -surgery plans -an unusual or allergic reaction to botulinum toxin, albumin, cow's milk protein, other medicines, foods, dyes, or preservatives -pregnant or trying to get pregnant -breast-feeding How should I use this medicine? This medicine is for injection into a muscle. It is given by a health care professional in a hospital or clinic setting. A special MedGuide will be given to you with each prescription and refill. Be sure to read this information carefully each time. Talk to your pediatrician regarding the use of this medicine in children. While this drug may be prescribed for children as young as 2 years for selected conditions, precautions to apply. Overdosage: If you think you have taken too much of this medicine contact a poison control center or emergency room at once. NOTE: This medicine is only for you. Do not share this medicine with others. What if I miss a dose? This does not apply. What may interact with this medicine? -aminoglycoside antibiotics like gentamicin, neomycin, tobramycin -muscle  relaxants -other botulinum toxin injections This list may not describe all possible interactions. Give your health care provider a list of all the medicines, herbs, non-prescription drugs, or dietary supplements you use. Also tell them if you smoke, drink alcohol, or use illegal drugs. Some items may interact with your medicine. What should I watch for while using this medicine? Visit your doctor for regular check ups. This medicine will cause weakness in the muscle where it is injected. Tell your doctor if you feel unusually weak in other muscles. Get medical help right away if you have problems with breathing, swallowing, or talking. This medicine contains albumin from human blood. It may be possible to pass an infection in this medicine, but no cases have been reported. Talk to your doctor about the risks and benefits of this medicine. If your activities have been limited by your condition, go back to your regular routine slowly after treatment with this medicine. This medicine can make your muscles weak. And, this medicine can make your eyelids droop or make you see blurry or double. If you have weak muscles or trouble seeing do not drive a car, use machinery, or do other dangerous activities. What side effects may I notice from receiving this medicine? Side effects that you should report to your doctor or health care professional as soon as possible: -allergic reactions like skin rash, itching or hives, swelling of the face, lips, or tongue -breathing problems -changes in vision -chest pain or tightness -eye swelling or drooping of the eyelids -fast, irregular heartbeat -loss of bladder control -numbness or weakness -signs and symptoms of infection like fever or chills;   cough; flu-like symptoms; sore throat -numbness or weakness -speech problems -swallowing problems Side effects that usually do not require medical attention (report to your doctor or health care professional if they  continue or are bothersome): -bruising or pain at site where injected -dizziness -dry eyes or eye irritation -dry mouth -headache -runny nose This list may not describe all possible side effects. Call your doctor for medical advice about side effects. You may report side effects to FDA at 1-800-FDA-1088. Where should I keep my medicine? This drug is given in a hospital or clinic and will not be stored at home. NOTE: This sheet is a summary. It may not cover all possible information. If you have questions about this medicine, talk to your doctor, pharmacist, or health care provider.  2018 Elsevier/Gold Standard (2015-01-08 14:54:52)  

## 2017-10-11 NOTE — Progress Notes (Addendum)
Botulinum toxin injection for cervical dystonia CPT code 321 010 9119 Diagnosis code G 24.3 Indication is cervical dystonia that has not responded to conservative care and interferes with activities of daily living as well as cervical range of motion. Chronic cervical pain not relieved by other treatments.  Informed consent was obtained after describing risks and benefits of the procedure with the patient this included bleeding bruising and infection The patient elects to proceed and has given Written consent.  REMS form completed Right Trapezius, 50 Right levator, 75 units Right sternocleidomastoid, 25units Right scalene 25 units ant 25 U middle Patient placed in a seated position A 27-gauge 1 inch needle electrode was used to guide the injection under EMG guidance.   All injections done after negative drawback for blood. Patient tolerated procedure well. Post procedure instructions given. Follow up appointment made  Will change to Dysport 300 U since your efect from Botox is 2-2.68mo  Indication for chronic opioid: Neoplasm related pain Medication and dose: oxycodone 5mg  QID # pills per month: 120 Last UDS date: 07/14/2017 Pain contract signed (Y/N): y Date narcotic database last reviewed (include red flags): 10/11/17

## 2017-10-13 ENCOUNTER — Other Ambulatory Visit: Payer: Medicare Other

## 2017-10-13 ENCOUNTER — Ambulatory Visit: Payer: Medicare Other

## 2017-10-13 MED ORDER — GOSERELIN ACETATE 3.6 MG ~~LOC~~ IMPL
DRUG_IMPLANT | SUBCUTANEOUS | Status: AC
  Filled 2017-10-13: qty 3.6

## 2017-10-14 ENCOUNTER — Telehealth: Payer: Self-pay | Admitting: Oncology

## 2017-10-14 NOTE — Telephone Encounter (Signed)
Spoke with pt's sister re appts that were scheduled.

## 2017-10-17 ENCOUNTER — Encounter (HOSPITAL_COMMUNITY): Payer: Self-pay | Admitting: Emergency Medicine

## 2017-10-17 ENCOUNTER — Ambulatory Visit (INDEPENDENT_AMBULATORY_CARE_PROVIDER_SITE_OTHER): Payer: Medicare Other

## 2017-10-17 ENCOUNTER — Other Ambulatory Visit: Payer: Self-pay

## 2017-10-17 ENCOUNTER — Ambulatory Visit (HOSPITAL_COMMUNITY)
Admission: EM | Admit: 2017-10-17 | Discharge: 2017-10-17 | Disposition: A | Discharge status: Home / Self Care | Payer: Medicare Other | Attending: Family Medicine | Admitting: Family Medicine

## 2017-10-17 DIAGNOSIS — S96911A Strain of unspecified muscle and tendon at ankle and foot level, right foot, initial encounter: Secondary | ICD-10-CM

## 2017-10-17 DIAGNOSIS — M25571 Pain in right ankle and joints of right foot: Secondary | ICD-10-CM | POA: Diagnosis not present

## 2017-10-17 DIAGNOSIS — M7989 Other specified soft tissue disorders: Secondary | ICD-10-CM | POA: Diagnosis not present

## 2017-10-17 DIAGNOSIS — S99911A Unspecified injury of right ankle, initial encounter: Secondary | ICD-10-CM | POA: Diagnosis not present

## 2017-10-17 MED ORDER — ACETAMINOPHEN 325 MG PO TABS
ORAL_TABLET | ORAL | Status: AC
  Filled 2017-10-17: qty 3

## 2017-10-17 MED ORDER — DICLOFENAC SODIUM 50 MG PO TBEC: 50.0000 mg | DELAYED_RELEASE_TABLET | Freq: Two times a day (BID) | ORAL | 0 refills | Status: DC

## 2017-10-17 MED ORDER — ACETAMINOPHEN 325 MG PO TABS
975.0000 mg | ORAL_TABLET | Freq: Once | ORAL | Status: AC
  Administered 2017-10-17: 975 mg via ORAL

## 2017-10-17 NOTE — ED Provider Notes (Signed)
[place]    CSN: [id number] Arrival date & time: 10/17/17  1034     History   Chief Complaint Chief Complaint  Patient presents with  . Fall    HPI Karen Hopkins is a 42 y.o. female.   Karen Hopkins presents with complaints of right ankle pain and swelling which has been worsening over the past two days. States she fell on 5/7 in her house, fell over something in the early morning and fell backwards. Had mild bruise at that time but primarily had body pain. Yesterday pain to ankle began to worsen, pain with weight bearing. States feels somewhat numb. No previous ankle surgery or fracture. Takes oxycodone for chronic pain but has not tried any other treatments for pain. Pain is 10/10. Hx of breast cancer, htn, lymphedema.    ROS per HPI.      Past Medical History:  Diagnosis Date  . Anxiety   . Cancer ([place]) 2011   Rt. Br. Ca  . Depression   . Essential hypertension 11/05/2015  . History of breast cancer 2011   right  . History of chemotherapy 2011  . History of radiation therapy 2011  . Hypertension    under control with med., has been on med. x 2 yr.  . Lymphedema of arm    right; no BP or puncture to right arm  . Personal history of chemotherapy 09/16/2009   rt breast  . Personal history of radiation therapy 05/2010   rt breast  . Pneumonia   . Seasonal allergies   . Sinus headache     Patient Active Problem List   Diagnosis Date Noted  . Dyspnea 09/23/2016  . Eczema 08/31/2016  . Essential hypertension 11/05/2015  . Cervical dystonia 08/25/2015  . Headache 07/09/2015  . Myofascial muscle pain 04/29/2015  . Adhesive capsulitis of right shoulder 04/29/2015  . Type 2 diabetes mellitus without complication ([place]) 58/52/7782  . Lower extremity edema 10/01/2014  . Malignant neoplasm of overlapping sites of right breast in female, estrogen receptor positive ([place]) 05/24/2014  . Myalgia and myositis 05/17/2014  . Neoplasm related pain 03/12/2014  . CN  (constipation) 11/26/2013  . H/O reduction mammoplasty 10/30/2013  . Seizure ([place]) 10/06/2013  . Status post bilateral breast implants 08/31/2013  . Acquired absence of breast and absent nipple 08/23/2013  . S/P breast reconstruction, right 08/23/2013  . AC joint arthropathy 07/02/2013  . Routine adult health maintenance 05/29/2013  . Hypokalemia 05/21/2013  . Chronic pain 04/19/2013  . Elevated blood sugar 03/09/2013  . Acute bronchitis 02/22/2013  . Lymphedema 05/24/2011  . RHINOSINUSITIS, CHRONIC 04/08/2010  . Obesity 08/04/2006  . DEPRESSIVE DISORDER, NOS 08/04/2006  . Allergic rhinitis 08/04/2006    Past Surgical History:  Procedure Laterality Date  . BREAST BIOPSY Right 08/26/2009  . BREAST REDUCTION SURGERY Left 08/23/2013   Procedure: LEFT BREAST REDUCTION  ;  Surgeon: Theodoro Kos, DO;  Location: [facility];  Service: Plastics;  Laterality: Left;  . CESAREAN SECTION  07/15/2001; 03/18/2004; 11/21/2008  . lateral orbiotomy Left 11/2015   Dr. Toy Cookey at [facility]  . LATISSIMUS FLAP TO BREAST Right 03/12/2013   Procedure: RIGHT BREAST LATISSIMUS FLAP WITH EXPANDER PLACEMENT;  Surgeon: Theodoro Kos, DO;  Location: [facility];  Service: Plastics;  Laterality: Right;  . LIPOSUCTION Bilateral 08/23/2013   Procedure: LIPOSUCTION;  Surgeon: Theodoro Kos, DO;  Location: [facility];  Service: Plastics;  Laterality: Bilateral;  . MASTECTOMY Right 2011  . MODIFIED RADICAL  MASTECTOMY Right 03/11/2010  . NASAL SEPTUM SURGERY    . PORT-A-CATH REMOVAL Left 12/01/2010  . PORTACATH PLACEMENT Left 09/12/2009  . REDUCTION MAMMAPLASTY Left 08/2013  . REMOVAL OF TISSUE EXPANDER AND PLACEMENT OF IMPLANT Right 08/23/2013   Procedure: REMOVAL RIGHT TISSUE EXPANDER AND PLACEMENT OF IMPLANT TO RIGHT BREAST ;  Surgeon: Theodoro Kos, DO;  Location: [facility];  Service: Plastics;  Laterality: Right;  . SUPRA-UMBILICAL HERNIA  3244  . TUBAL LIGATION  11/21/2008    OB  History    Gravida  3   Para  3   Term  2   Preterm  1   AB      Living  2     SAB      TAB      Ectopic      Multiple      Live Births  2            Home Medications    Prior to Admission medications   Medication Sig Start Date End Date Taking? Authorizing Provider  albuterol (PROAIR HFA) 108 (90 Base) MCG/ACT inhaler Inhale 2 puffs into the lungs every 6 (six) hours as needed for wheezing or shortness of breath. 10/12/16   [name], MD  albuterol (PROVENTIL) (2.5 MG/3ML) 0.083% nebulizer solution Take 3 mLs (2.5 mg total) by nebulization every 6 (six) hours as needed for wheezing or shortness of breath. 08/03/16   [name], MD  ALPRAZolam (XANAX XR) 1 MG 24 hr tablet Take 1 tablet by mouth daily.  08/16/17   [provider]  anastrozole (ARIMIDEX) 1 MG tablet Take 1 tablet (1 mg total) by mouth daily. 09/07/16   Magrinat, Virgie Dad, MD  carvedilol (COREG) 6.25 MG tablet TAKE ONE (1) TABLET BY MOUTH TWO (2) TIMES DAILY WITH A MEAL 06/15/17   Skeet Latch, MD  diclofenac (VOLTAREN) 50 MG EC tablet Take 1 tablet (50 mg total) by mouth 2 (two) times daily. 10/17/17   Augusto Gamble B, NP  diclofenac sodium (VOLTAREN) 1 % GEL Apply 4 g topically 4 (four) times daily. Use as directed 09/19/17   Bayard Hugger, NP  DULoxetine (CYMBALTA) 30 MG capsule Take 90 mg by mouth daily.  05/18/16   [provider]  FLOVENT HFA 220 MCG/ACT inhaler INHALE 2 PUFFS INTO THE LUNGS 2 TIMES DAILY 10/03/17   Alveda Reasons, MD  furosemide (LASIX) 40 MG tablet Take 1 tablet (40 mg total) by mouth daily. 06/15/17   Skeet Latch, MD  gabapentin (NEURONTIN) 100 MG capsule Take 1 capsule (100 mg total) by mouth 3 (three) times daily. 09/19/17   Bayard Hugger, NP  goserelin (ZOLADEX) 3.6 MG injection Inject 3.6 mg into the skin every 28 (twenty-eight) days.    [provider]  hydrOXYzine (ATARAX/VISTARIL) 25 MG tablet TAKE ONE (1) TABLET BY  MOUTH TWO (2) TIMES DAILY 08/17/17   Magrinat, Virgie Dad, MD  metFORMIN (GLUCOPHAGE) 500 MG tablet TAKE ONE (1) TABLET BY MOUTH TWO (2) TIMES DAILY WITH A MEAL 10/12/17   Chambliss, Jeb Levering, MD  metoCLOPramide (REGLAN) 10 MG tablet Take 1 tablet (10 mg total) by mouth every 8 (eight) hours as needed. Patient taking differently: Take 10 mg by mouth every 8 (eight) hours as needed for nausea or vomiting.  09/01/15   Melvenia Beam, MD  oseltamivir (TAMIFLU) 75 MG capsule Take 1 capsule (75 mg total) by mouth every 12 (twelve) hours. 08/24/17  Horton, Barbette Hair, MD  oxyCODONE (OXY IR/ROXICODONE) 5 MG immediate release tablet Take 1 tablet (5 mg total) by mouth every 6 (six) hours as needed for severe pain. 10/11/17   Kirsteins, Luanna Salk, MD  polyethylene glycol powder (GLYCOLAX) powder Take 17 g by mouth daily. Patient taking differently: Take 17 g by mouth daily as needed for mild constipation.  05/23/17   Magrinat, Virgie Dad, MD  potassium chloride (MICRO-K) 10 MEQ CR capsule TAKE TWO CAPSULES BY MOUTH THREE TIMES A DAY 08/17/17   Magrinat, Virgie Dad, MD  potassium chloride SA (K-DUR,KLOR-CON) 20 MEQ tablet Take 1 tablet (20 mEq total) by mouth 2 (two) times daily. 08/24/17   Horton, Barbette Hair, MD  sulfamethoxazole-trimethoprim (BACTRIM DS,SEPTRA DS) 800-160 MG tablet Take 1 tablet by mouth 2 (two) times daily. 09/13/17   Tanner, Lyndon Code., PA-C  tiZANidine (ZANAFLEX) 4 MG tablet TAKE ONE (1) TABLET BY MOUTH 3 TIMES DAILY Patient taking differently: Take 4 mg by mouth every 6 (six) hours as needed for muscle spasms.  08/23/17   Bayard Hugger, NP  topiramate (TOPAMAX) 200 MG tablet Take 1 tablet (200 mg total) by mouth at bedtime. 10/27/16   Melvenia Beam, MD  traZODone (DESYREL) 100 MG tablet Take 100 mg by mouth at bedtime as needed for sleep.  05/18/16   [provider]  triamcinolone ointment (KENALOG) 0.5 % Apply 1 application topically 2 (two) times daily. 03/23/17   [name], MD    VYVANSE 50 MG capsule Take 50 mg by mouth daily.  03/21/17   [provider]    Family History Family History  Problem Relation Age of Onset  . Hypertension Mother   . Breast cancer Mother 11  . Diabetes type II Father   . Prostate cancer Father   . Stroke Neg Hx     Social History Social History   Tobacco Use  . Smoking status: Former Smoker    Packs/day: 0.25    Years: 5.00    Pack years: 1.25    Types: Cigarettes    Start date: 06/07/2002    Last attempt to quit: 04/26/2010    Years since quitting: 7.4  . Smokeless tobacco: Never Used  Substance Use Topics  . Alcohol use: No  . Drug use: No     Allergies   Penicillins; Lyrica [pregabalin]; Meloxicam; and Robaxin [methocarbamol]   Review of Systems Review of Systems   Physical Exam Triage Vital Signs ED Triage Vitals  Enc Vitals Group     BP 10/17/17 1125 132/89     Pulse Rate 10/17/17 1125 92     Resp 10/17/17 1125 18     Temp 10/17/17 1125 97.9 F (36.6 C)     Temp Source 10/17/17 1125 Oral     SpO2 10/17/17 1125 100 %     Weight --      Height --      Head Circumference --      Peak Flow --      Pain Score 10/17/17 1123 10     Pain Loc --      Pain Edu? --      Excl. in [place]? --    No data found.  Updated Vital Signs BP 132/89 (BP Location: Left Wrist)   Pulse 92   Temp 97.9 F (36.6 C) (Oral)   Resp 18   LMP 07/22/2014 (Approximate)   SpO2 100%    Physical Exam  Constitutional: She is oriented  to [place], place, and time. She appears well-developed and well-nourished. No distress.  Cardiovascular: Normal rate, regular rhythm and normal heart sounds.  Pulmonary/Chest: Effort normal and breath sounds normal.  Musculoskeletal:       Right ankle: She exhibits decreased range of motion and swelling. She exhibits no ecchymosis, no deformity, no laceration and normal pulse. Tenderness. Lateral malleolus and medial malleolus tenderness found. Achilles tendon normal.        Feet:  Significant swelling and tenderness primarily to right lateral ankle but mild tenderness to medial ankle as well; pain with passive and active ROM of ankle; gross sensation intact, cap refill < 2 seconds; pain and swelling does not extend up left/shin at all; strong pedal pulse  Neurological: She is alert and oriented to [place], place, and time.  Skin: Skin is warm and dry.     UC Treatments / Results  Labs (all labs ordered are listed, but only abnormal results are displayed) Labs Reviewed - No data to display  EKG None  Radiology Dg Ankle Complete Right  Result Date: 10/17/2017 CLINICAL DATA:  Patient states that she fell x 1 week ago, increased pain with swelling and redness to lateral side of right ankle. No Hx EXAM: RIGHT ANKLE - COMPLETE 3+ VIEW COMPARISON:  None FINDINGS: Ankle mortise intact. The talar dome is normal. No malleolar fracture. The calcaneus is normal. Enthesopathic spurring along the plantar aspect of the calcaneus. IMPRESSION: No acute osseous abnormality. Electronically Signed   By: Suzy Bouchard M.D.   On: 10/17/2017 11:53    Procedures Procedures (including critical care time)  Medications Ordered in UC Medications - No data to display  Initial Impression / Assessment and Plan / UC Course  I have reviewed the triage vital signs and the nursing notes.  Pertinent labs & imaging results that were available during my care of the patient were reviewed by me and considered in my medical decision making (see chart for details).     Sprain vs gout vs arthritis vs dvt vs septic joint considered. Area of point tenderness and redness, afebrile, known fall last week. No history of gout but this does remain high in differentials, discussed with patient. Xray without acute findings. Remains most likely sprain at this time. Cam walker provided due to pain with weight bearing, crutches and ace wrap. Ice, elevation, NSAIDS for pain control. Encouraged follow up  for recheck with pcp and/or orthopedics. Return precautions provided. Patient verbalized understanding and agreeable to plan.   Final Clinical Impressions(s) / UC Diagnoses   Final diagnoses:  Strain of right ankle, initial encounter     Discharge Instructions     Ice, elevation, use of ace wrap and/or walking boot for support. Wean out of the boot and crutches over the next 3-5 days as pain improves.  Activity as tolerated.  Use of diclofenac twice a day for pain, take with food.  If no improvement or if worsening of symptoms please follow up with your primary care provider and/or orthopedics.     ED Prescriptions    Medication Sig Dispense Auth. Provider   diclofenac (VOLTAREN) 50 MG EC tablet Take 1 tablet (50 mg total) by mouth 2 (two) times daily. 20 tablet Zigmund Gottron, NP     Controlled Substance Prescriptions [place] Controlled Substance Registry consulted? Not Applicable   Zigmund Gottron, NP 10/17/17 1217

## 2017-10-17 NOTE — Discharge Instructions (Signed)
Ice, elevation, use of ace wrap and/or walking boot for support. Wean out of the boot and crutches over the next 3-5 days as pain improves.  Activity as tolerated.  Use of diclofenac twice a day for pain, take with food.  If no improvement or if worsening of symptoms please follow up with your primary care provider and/or orthopedics.

## 2017-10-17 NOTE — ED Triage Notes (Signed)
The patient presented to the [facility] with a complaint of right ankle pain secondary to a fall x 1 week. The patient stated that the pain and swelling increased yesterday. The patient reported that she can not put weight on her right ankle.

## 2017-10-18 ENCOUNTER — Telehealth: Payer: Self-pay | Admitting: *Deleted

## 2017-10-18 ENCOUNTER — Other Ambulatory Visit: Payer: Self-pay | Admitting: Oncology

## 2017-10-18 ENCOUNTER — Inpatient Hospital Stay: Payer: Medicare Other

## 2017-10-18 VITALS — BP 135/85 | HR 100 | Temp 98.0°F | Resp 18

## 2017-10-18 DIAGNOSIS — Z170 Estrogen receptor positive status [ER+]: Principal | ICD-10-CM

## 2017-10-18 DIAGNOSIS — C50011 Malignant neoplasm of nipple and areola, right female breast: Secondary | ICD-10-CM

## 2017-10-18 DIAGNOSIS — Z5111 Encounter for antineoplastic chemotherapy: Secondary | ICD-10-CM | POA: Diagnosis not present

## 2017-10-18 DIAGNOSIS — C50811 Malignant neoplasm of overlapping sites of right female breast: Secondary | ICD-10-CM

## 2017-10-18 LAB — COMPREHENSIVE METABOLIC PANEL
ALK PHOS: 97 U/L (ref 40–150)
ALT: 19 U/L (ref 0–55)
ANION GAP: 8 (ref 3–11)
AST: 19 U/L (ref 5–34)
Albumin: 3.8 g/dL (ref 3.5–5.0)
BUN: 8 mg/dL (ref 7–26)
CO2: 22 mmol/L (ref 22–29)
Calcium: 9.3 mg/dL (ref 8.4–10.4)
Chloride: 112 mmol/L — ABNORMAL HIGH (ref 98–109)
Creatinine, Ser: 0.77 mg/dL (ref 0.60–1.10)
Glucose, Bld: 104 mg/dL (ref 70–140)
Potassium: 3.3 mmol/L — ABNORMAL LOW (ref 3.5–5.1)
SODIUM: 142 mmol/L (ref 136–145)
TOTAL PROTEIN: 7.6 g/dL (ref 6.4–8.3)
Total Bilirubin: 0.3 mg/dL (ref 0.2–1.2)

## 2017-10-18 LAB — CBC WITH DIFFERENTIAL/PLATELET
BASOS ABS: 0 10*3/uL (ref 0.0–0.1)
Basophils Relative: 0 %
Eosinophils Absolute: 0.6 10*3/uL — ABNORMAL HIGH (ref 0.0–0.5)
Eosinophils Relative: 5 %
HEMATOCRIT: 38.3 % (ref 34.8–46.6)
HEMOGLOBIN: 12.4 g/dL (ref 11.6–15.9)
Lymphocytes Relative: 18 %
Lymphs Abs: 2.1 10*3/uL (ref 0.9–3.3)
MCH: 28.3 pg (ref 25.1–34.0)
MCHC: 32.5 g/dL (ref 31.5–36.0)
MCV: 86.9 fL (ref 79.5–101.0)
MONOS PCT: 6 %
Monocytes Absolute: 0.6 10*3/uL (ref 0.1–0.9)
NEUTROS ABS: 8 10*3/uL — AB (ref 1.5–6.5)
NEUTROS PCT: 71 %
Platelets: 286 10*3/uL (ref 145–400)
RBC: 4.41 MIL/uL (ref 3.70–5.45)
RDW: 15.8 % — ABNORMAL HIGH (ref 11.2–14.5)
WBC: 11.4 10*3/uL — AB (ref 3.9–10.3)

## 2017-10-18 MED ORDER — GOSERELIN ACETATE 3.6 MG ~~LOC~~ IMPL
DRUG_IMPLANT | SUBCUTANEOUS | Status: AC
  Filled 2017-10-18: qty 3.6

## 2017-10-18 MED ORDER — GOSERELIN ACETATE 3.6 MG ~~LOC~~ IMPL
3.6000 mg | DRUG_IMPLANT | Freq: Once | SUBCUTANEOUS | Status: AC
  Administered 2017-10-18: 3.6 mg via SUBCUTANEOUS

## 2017-10-18 NOTE — Telephone Encounter (Signed)
This RN spoke with Karen Hopkins per her concerns with her mother's recent diagnosis with metastatic breast cancer.  Karen Hopkins states " we do not understand everything that is happening and not being able to be there to ask questions is difficult "  Karen Hopkins states they have requested medical records to be sent to this office - records have not be notified as received.  Overall Karen Hopkins wants to know " that we are helping my mom make good decisions and what we need to do to help her"  Karen Hopkins was diagnosed at age 42 with a stage IIIA breast cancer now over 7 years out and is on anastrazole  with yearly visits.  This RN gave [employer] support and validation for her concerns- as well as Karen Hopkins has discussed with Karen Hopkins her concerns.  Karen Hopkins is hoping records can be sent so Karen Hopkins can review them " because I trust him so much ".  This RN gave Karen Mccoy MD business card with email address to give to her mother's doctor ( Karen Marolyn Hammock ) at planned visit this Thursday. Karen Marolyn Hammock can send records as a courtesy to Karen Hopkins if her mother is in agreement.  This RN's name- direct desk number with fax number as well put on card for any additional assistance with communication.

## 2017-10-18 NOTE — Patient Instructions (Signed)
Goserelin injection What is this medicine? GOSERELIN (GOE se rel in) is similar to a hormone found in the body. It lowers the amount of sex hormones that the body makes. Men will have lower testosterone levels and women will have lower estrogen levels while taking this medicine. In men, this medicine is used to treat prostate cancer; the injection is either given once per month or once every 12 weeks. A once per month injection (only) is used to treat women with endometriosis, dysfunctional uterine bleeding, or advanced breast cancer. This medicine may be used for other purposes; ask your health care provider or pharmacist if you have questions. What should I tell my health care provider before I take this medicine? They need to know if you have any of these conditions (some only apply to women): -diabetes -heart disease or previous heart attack -high blood pressure -high cholesterol -kidney disease -osteoporosis or low bone density -problems passing urine -spinal cord injury -stroke -tobacco smoker -an unusual or allergic reaction to goserelin, hormone therapy, other medicines, foods, dyes, or preservatives -pregnant or trying to get pregnant -breast-feeding How should I use this medicine? This medicine is for injection under the skin. It is given by a health care professional in a hospital or clinic setting. Men receive this injection once every 4 weeks or once every 12 weeks. Women will only receive the once every 4 weeks injection. Talk to your pediatrician regarding the use of this medicine in children. Special care may be needed. Overdosage: If you think you have taken too much of this medicine contact a poison control center or emergency room at once. NOTE: This medicine is only for you. Do not share this medicine with others. What if I miss a dose? It is important not to miss your dose. Call your doctor or health care professional if you are unable to keep an appointment. What may  interact with this medicine? -female hormones like estrogen -herbal or dietary supplements like black cohosh, chasteberry, or DHEA -female hormones like testosterone -prasterone This list may not describe all possible interactions. Give your health care provider a list of all the medicines, herbs, non-prescription drugs, or dietary supplements you use. Also tell them if you smoke, drink alcohol, or use illegal drugs. Some items may interact with your medicine. What should I watch for while using this medicine? Visit your doctor or health care professional for regular checks on your progress. Your symptoms may appear to get worse during the first weeks of this therapy. Tell your doctor or healthcare professional if your symptoms do not start to get better or if they get worse after this time. Your bones may get weaker if you take this medicine for a long time. If you smoke or frequently drink alcohol you may increase your risk of bone loss. A family history of osteoporosis, chronic use of drugs for seizures (convulsions), or corticosteroids can also increase your risk of bone loss. Talk to your doctor about how to keep your bones strong. This medicine should stop regular monthly menstration in women. Tell your doctor if you continue to menstrate. Women should not become pregnant while taking this medicine or for 12 weeks after stopping this medicine. Women should inform their doctor if they wish to become pregnant or think they might be pregnant. There is a potential for serious side effects to an unborn child. Talk to your health care professional or pharmacist for more information. Do not breast-feed an infant while taking this medicine. Men should   inform their doctors if they wish to father a child. This medicine may lower sperm counts. Talk to your health care professional or pharmacist for more information. What side effects may I notice from receiving this medicine? Side effects that you should  report to your doctor or health care professional as soon as possible: -allergic reactions like skin rash, itching or hives, swelling of the face, lips, or tongue -bone pain -breathing problems -changes in vision -chest pain -feeling faint or lightheaded, falls -fever, chills -pain, swelling, warmth in the leg -pain, tingling, numbness in the hands or feet -signs and symptoms of low blood pressure like dizziness; feeling faint or lightheaded, falls; unusually weak or tired -stomach pain -swelling of the ankles, feet, hands -trouble passing urine or change in the amount of urine -unusually high or low blood pressure -unusually weak or tired Side effects that usually do not require medical attention (report to your doctor or health care professional if they continue or are bothersome): -change in sex drive or performance -changes in breast size in both males and females -changes in emotions or moods -headache -hot flashes -irritation at site where injected -loss of appetite -skin problems like acne, dry skin -vaginal dryness This list may not describe all possible side effects. Call your doctor for medical advice about side effects. You may report side effects to FDA at 1-800-FDA-1088. Where should I keep my medicine? This drug is given in a hospital or clinic and will not be stored at home. NOTE: This sheet is a summary. It may not cover all possible information. If you have questions about this medicine, talk to your doctor, pharmacist, or health care provider.    2016, Elsevier/Gold Standard. (2013-07-31 11:10:35)  

## 2017-10-28 ENCOUNTER — Encounter: Payer: Self-pay | Admitting: General Practice

## 2017-10-28 NOTE — Progress Notes (Signed)
[place] Spiritual Care Note  Made brief contact with Karen Hopkins by phone for f/u support. She plans to return call at a more convenient time.   [place], [place], [facility] Pager [phone] Voicemail [phone]

## 2017-11-01 ENCOUNTER — Ambulatory Visit: Payer: Medicare Other | Admitting: Neurology

## 2017-11-01 ENCOUNTER — Telehealth: Payer: Self-pay | Admitting: *Deleted

## 2017-11-01 NOTE — Telephone Encounter (Signed)
Pt no showed f/u appt on 11/01/2017 @ 11:00.

## 2017-11-02 ENCOUNTER — Encounter: Payer: Self-pay | Admitting: Neurology

## 2017-11-08 ENCOUNTER — Inpatient Hospital Stay: Payer: Medicare Other | Attending: Oncology

## 2017-11-08 ENCOUNTER — Telehealth: Payer: Self-pay | Admitting: Oncology

## 2017-11-08 DIAGNOSIS — Z5111 Encounter for antineoplastic chemotherapy: Secondary | ICD-10-CM | POA: Insufficient documentation

## 2017-11-08 DIAGNOSIS — C50011 Malignant neoplasm of nipple and areola, right female breast: Secondary | ICD-10-CM

## 2017-11-08 NOTE — Telephone Encounter (Signed)
Per Karen Hopkins her schedule needed to be adjusted for injections a week ahead

## 2017-11-08 NOTE — Progress Notes (Signed)
Pt did receive injection today. Last injection was 10/18/2017, injection must be 28 days latter after last injection. Pt sent to scheduling to reschedule for next week and up coming injections.

## 2017-11-11 ENCOUNTER — Telehealth: Payer: Self-pay | Admitting: Physical Medicine & Rehabilitation

## 2017-11-11 ENCOUNTER — Encounter: Payer: Medicare Other | Admitting: Registered Nurse

## 2017-11-11 MED ORDER — OXYCODONE HCL 5 MG PO TABS: 5.0000 mg | ORAL_TABLET | Freq: Four times a day (QID) | ORAL | 0 refills | Status: DC | PRN

## 2017-11-11 NOTE — Telephone Encounter (Signed)
Return Ms. Jaskiran call, she states her mother has passed away, emotional support given. She's in the process of making arrangements. Her appointment wiill be re-scheduled. Last Oxycodone prescription was filled on 10/21/2017 per PMP Aware Web Site. She's scheduled for 11/23/2017 for 2:20 appointment. She verbalizes understanding.

## 2017-11-11 NOTE — Telephone Encounter (Signed)
Patient canceled visit today with Zella Ball.  Her mother passed away and would like for Zella Ball to call her.

## 2017-11-12 ENCOUNTER — Other Ambulatory Visit: Payer: Self-pay | Admitting: Neurology

## 2017-11-12 ENCOUNTER — Other Ambulatory Visit: Payer: Self-pay | Admitting: Family Medicine

## 2017-11-15 ENCOUNTER — Inpatient Hospital Stay: Payer: Medicare Other

## 2017-11-15 ENCOUNTER — Other Ambulatory Visit: Payer: Self-pay | Admitting: Oncology

## 2017-11-15 ENCOUNTER — Telehealth: Payer: Self-pay | Admitting: *Deleted

## 2017-11-15 VITALS — BP 139/90 | HR 98 | Temp 98.4°F | Resp 20

## 2017-11-15 DIAGNOSIS — Z5111 Encounter for antineoplastic chemotherapy: Secondary | ICD-10-CM | POA: Diagnosis not present

## 2017-11-15 DIAGNOSIS — C50011 Malignant neoplasm of nipple and areola, right female breast: Secondary | ICD-10-CM | POA: Diagnosis not present

## 2017-11-15 MED ORDER — GOSERELIN ACETATE 3.6 MG ~~LOC~~ IMPL
DRUG_IMPLANT | SUBCUTANEOUS | Status: AC
  Filled 2017-11-15: qty 3.6

## 2017-11-15 MED ORDER — GOSERELIN ACETATE 3.6 MG ~~LOC~~ IMPL
3.6000 mg | DRUG_IMPLANT | Freq: Once | SUBCUTANEOUS | Status: AC
  Administered 2017-11-15: 3.6 mg via SUBCUTANEOUS

## 2017-11-15 NOTE — Telephone Encounter (Signed)
Patient needs to schedule follow up with me, have not seen her in over a year. Thanks [name], MD

## 2017-11-15 NOTE — Progress Notes (Signed)
Ruhani'S mOM DIED mAY 2019--WE SENT HER A NOTE AND THE [facility] IS CONTACTING EHR

## 2017-11-15 NOTE — Patient Instructions (Signed)
Goserelin injection What is this medicine? GOSERELIN (GOE se rel in) is similar to a hormone found in the body. It lowers the amount of sex hormones that the body makes. Men will have lower testosterone levels and women will have lower estrogen levels while taking this medicine. In men, this medicine is used to treat prostate cancer; the injection is either given once per month or once every 12 weeks. A once per month injection (only) is used to treat women with endometriosis, dysfunctional uterine bleeding, or advanced breast cancer. This medicine may be used for other purposes; ask your health care provider or pharmacist if you have questions. What should I tell my health care provider before I take this medicine? They need to know if you have any of these conditions (some only apply to women): -diabetes -heart disease or previous heart attack -high blood pressure -high cholesterol -kidney disease -osteoporosis or low bone density -problems passing urine -spinal cord injury -stroke -tobacco smoker -an unusual or allergic reaction to goserelin, hormone therapy, other medicines, foods, dyes, or preservatives -pregnant or trying to get pregnant -breast-feeding How should I use this medicine? This medicine is for injection under the skin. It is given by a health care professional in a hospital or clinic setting. Men receive this injection once every 4 weeks or once every 12 weeks. Women will only receive the once every 4 weeks injection. Talk to your pediatrician regarding the use of this medicine in children. Special care may be needed. Overdosage: If you think you have taken too much of this medicine contact a poison control center or emergency room at once. NOTE: This medicine is only for you. Do not share this medicine with others. What if I miss a dose? It is important not to miss your dose. Call your doctor or health care professional if you are unable to keep an appointment. What may  interact with this medicine? -female hormones like estrogen -herbal or dietary supplements like black cohosh, chasteberry, or DHEA -female hormones like testosterone -prasterone This list may not describe all possible interactions. Give your health care provider a list of all the medicines, herbs, non-prescription drugs, or dietary supplements you use. Also tell them if you smoke, drink alcohol, or use illegal drugs. Some items may interact with your medicine. What should I watch for while using this medicine? Visit your doctor or health care professional for regular checks on your progress. Your symptoms may appear to get worse during the first weeks of this therapy. Tell your doctor or healthcare professional if your symptoms do not start to get better or if they get worse after this time. Your bones may get weaker if you take this medicine for a long time. If you smoke or frequently drink alcohol you may increase your risk of bone loss. A family history of osteoporosis, chronic use of drugs for seizures (convulsions), or corticosteroids can also increase your risk of bone loss. Talk to your doctor about how to keep your bones strong. This medicine should stop regular monthly menstration in women. Tell your doctor if you continue to menstrate. Women should not become pregnant while taking this medicine or for 12 weeks after stopping this medicine. Women should inform their doctor if they wish to become pregnant or think they might be pregnant. There is a potential for serious side effects to an unborn child. Talk to your health care professional or pharmacist for more information. Do not breast-feed an infant while taking this medicine. Men should   inform their doctors if they wish to father a child. This medicine may lower sperm counts. Talk to your health care professional or pharmacist for more information. What side effects may I notice from receiving this medicine? Side effects that you should  report to your doctor or health care professional as soon as possible: -allergic reactions like skin rash, itching or hives, swelling of the face, lips, or tongue -bone pain -breathing problems -changes in vision -chest pain -feeling faint or lightheaded, falls -fever, chills -pain, swelling, warmth in the leg -pain, tingling, numbness in the hands or feet -signs and symptoms of low blood pressure like dizziness; feeling faint or lightheaded, falls; unusually weak or tired -stomach pain -swelling of the ankles, feet, hands -trouble passing urine or change in the amount of urine -unusually high or low blood pressure -unusually weak or tired Side effects that usually do not require medical attention (report to your doctor or health care professional if they continue or are bothersome): -change in sex drive or performance -changes in breast size in both males and females -changes in emotions or moods -headache -hot flashes -irritation at site where injected -loss of appetite -skin problems like acne, dry skin -vaginal dryness This list may not describe all possible side effects. Call your doctor for medical advice about side effects. You may report side effects to FDA at 1-800-FDA-1088. Where should I keep my medicine? This drug is given in a hospital or clinic and will not be stored at home. NOTE: This sheet is a summary. It may not cover all possible information. If you have questions about this medicine, talk to your doctor, pharmacist, or health care provider.    2016, Elsevier/Gold Standard. (2013-07-31 11:10:35)  

## 2017-11-15 NOTE — Telephone Encounter (Signed)
Pt scheduled for an appt. Deseree Blount, CMA  

## 2017-11-15 NOTE — Telephone Encounter (Signed)
This RN spoke with [employer] today post injection.  Annella states her mother passed in late May ( buried her on 10/28/2017) post being admitted to [facility].  Finlee states " I feel we didn't do enough "  This RN validated her feelings, validation given with hugs.  This RN encouraged the patient to continue with support per this office especially since grief can be known to increase physical pain ( she has known pain control issues ).  Brynja agreed to above.  Note Manmeet had her zoladex injection today - a week late. She notices increased abdominal discomfort with delay in injections and ideally she has the best control with hormonal balance ( mood swings, bloating and abd pain ) with zoladex if she receives it ideally 23 days apart.  This RN will send scheduling message to schedule injections q 23 days.

## 2017-11-16 ENCOUNTER — Ambulatory Visit: Payer: Medicare Other | Admitting: Registered Nurse

## 2017-11-18 ENCOUNTER — Encounter: Payer: Self-pay | Admitting: General Practice

## 2017-11-18 NOTE — Progress Notes (Signed)
[place] Note  Caught Karen Hopkins by phone when she was unable to talk. Ensured that she has my direct dial number, as she plans to call back. Following for support, but please also page if immediate needs arise or circumstances change. Thank you.   [place], [place], [facility] Pager [phone] Voicemail [phone]

## 2017-11-20 ENCOUNTER — Encounter: Payer: Self-pay | Admitting: Neurology

## 2017-11-23 ENCOUNTER — Other Ambulatory Visit: Payer: Self-pay | Admitting: Family Medicine

## 2017-11-23 ENCOUNTER — Encounter: Payer: Medicare Other | Attending: Registered Nurse | Admitting: Registered Nurse

## 2017-11-23 ENCOUNTER — Encounter: Payer: Self-pay | Admitting: Registered Nurse

## 2017-11-23 VITALS — BP 134/86 | HR 108 | Ht 64.0 in | Wt 230.0 lb

## 2017-11-23 DIAGNOSIS — M545 Low back pain, unspecified: Secondary | ICD-10-CM

## 2017-11-23 DIAGNOSIS — M170 Bilateral primary osteoarthritis of knee: Secondary | ICD-10-CM

## 2017-11-23 DIAGNOSIS — Z79899 Other long term (current) drug therapy: Secondary | ICD-10-CM

## 2017-11-23 DIAGNOSIS — R296 Repeated falls: Secondary | ICD-10-CM | POA: Diagnosis not present

## 2017-11-23 DIAGNOSIS — G894 Chronic pain syndrome: Secondary | ICD-10-CM | POA: Diagnosis not present

## 2017-11-23 DIAGNOSIS — G243 Spasmodic torticollis: Secondary | ICD-10-CM

## 2017-11-23 DIAGNOSIS — F3289 Other specified depressive episodes: Secondary | ICD-10-CM | POA: Diagnosis not present

## 2017-11-23 DIAGNOSIS — G893 Neoplasm related pain (acute) (chronic): Secondary | ICD-10-CM | POA: Diagnosis not present

## 2017-11-23 DIAGNOSIS — K5903 Drug induced constipation: Secondary | ICD-10-CM

## 2017-11-23 DIAGNOSIS — M542 Cervicalgia: Secondary | ICD-10-CM

## 2017-11-23 DIAGNOSIS — Z5181 Encounter for therapeutic drug level monitoring: Secondary | ICD-10-CM

## 2017-11-23 DIAGNOSIS — T402X5A Adverse effect of other opioids, initial encounter: Secondary | ICD-10-CM

## 2017-11-23 DIAGNOSIS — M546 Pain in thoracic spine: Secondary | ICD-10-CM | POA: Insufficient documentation

## 2017-11-23 DIAGNOSIS — I890 Lymphedema, not elsewhere classified: Secondary | ICD-10-CM | POA: Diagnosis not present

## 2017-11-23 DIAGNOSIS — M792 Neuralgia and neuritis, unspecified: Secondary | ICD-10-CM | POA: Diagnosis not present

## 2017-11-23 MED ORDER — LUBIPROSTONE 8 MCG PO CAPS: 8.0000 ug | ORAL_CAPSULE | Freq: Every day | ORAL | 1 refills | Status: DC

## 2017-11-23 NOTE — Progress Notes (Signed)
Subjective:    Patient ID: Karen Hopkins, female    DOB: 05-07-1976, 42 y.o.   MRN: [id number]  HPI: Karen Hopkins is a 42 year old female who returns for follow up appointment for chronic pain and medication refill. She states her pain is located in her head ( occipital/ post fall), neck radiating into her right arm, upper back mainly right side, lower back, bilateral knees and bilateral lower extremities. She rates her pain 8. Her current exercise regime is walking.   Karen Hopkins complaining of constipation< Movantik not on her drug formulary, prescribed Amitiza. In the past she's tried Miralax and stool softners OTC with no relief noted.   Karen Hopkins reports on Monday night ( 11/21/17) while mopping she slipped and fell backwards and hit her head on a stand. She denies losing consciousness, she was able to pick herself up, she didn't seek medical attention.   Emotional support given to Karen Hopkins, her mother passed away, she's receiving counseling at [facility].   Pain Inventory Average Pain 5 Pain Right Now 8 My pain is intermittent, constant, sharp, burning, dull, stabbing, tingling and aching  In the last 24 hours, has pain interfered with the following? General activity 8 Relation with others 8 Enjoyment of life 8 What TIME of day is your pain at its worst? all Sleep (in general) Fair  Pain is worse with: walking, bending, sitting, standing and some activites Pain improves with: rest and medication Relief from Meds: 7  Mobility walk without assistance ability to climb steps?  yes do you drive?  yes  Function disabled: date disabled 2016  Neuro/Psych bladder control problems weakness numbness tingling trouble walking spasms depression anxiety  Prior Studies Any changes since last visit?  no  Physicians involved in your care Any changes since last visit?  no   Family History  Problem Relation Age of Onset  . Hypertension Mother   . Breast  cancer Mother 17  . Diabetes type II Father   . Prostate cancer Father   . Stroke Neg Hx    Social History   Socioeconomic History  . Marital status: Single    Spouse name: Not on file  . Number of children: 3  . Years of education: 12  . Highest education level: Not on file  Occupational History  . Occupation: Disability   Social Needs  . Financial resource strain: Not on file  . Food insecurity:    Worry: Not on file    Inability: Not on file  . Transportation needs:    Medical: Not on file    Non-medical: Not on file  Tobacco Use  . Smoking status: Former Smoker    Packs/day: 0.25    Years: 5.00    Pack years: 1.25    Types: Cigarettes    Start date: 06/07/2002    Last attempt to quit: 04/26/2010    Years since quitting: 7.5  . Smokeless tobacco: Never Used  Substance and Sexual Activity  . Alcohol use: No  . Drug use: No  . Sexual activity: Not Currently    Birth control/protection: Surgical  Lifestyle  . Physical activity:    Days per week: Not on file    Minutes per session: Not on file  . Stress: Not on file  Relationships  . Social connections:    Talks on phone: Not on file    Gets together: Not on file    Attends religious service: Not on  file    Active member of club or organization: Not on file    Attends meetings of clubs or organizations: Not on file    Relationship status: Not on file  Other Topics Concern  . Not on file  Social History Narrative   Lives with kids   Caffeine use: none    Past Surgical History:  Procedure Laterality Date  . BREAST BIOPSY Right 08/26/2009  . BREAST REDUCTION SURGERY Left 08/23/2013   Procedure: LEFT BREAST REDUCTION  ;  Surgeon: Theodoro Kos, DO;  Location: [place];  Service: Plastics;  Laterality: Left;  . CESAREAN SECTION  07/15/2001; 03/18/2004; 11/21/2008  . lateral orbiotomy Left 11/2015   Dr. Toy [facility]key at [facility]  . LATISSIMUS FLAP TO BREAST Right 03/12/2013   Procedure: RIGHT BREAST LATISSIMUS  FLAP WITH EXPANDER PLACEMENT;  Surgeon: Theodoro Kos, DO;  Location: [place];  Service: Plastics;  Laterality: Right;  . LIPOSUCTION Bilateral 08/23/2013   Procedure: LIPOSUCTION;  Surgeon: Theodoro Kos, DO;  Location: [place];  Service: Plastics;  Laterality: Bilateral;  . MASTECTOMY Right 2011  . MODIFIED RADICAL MASTECTOMY Right 03/11/2010  . NASAL SEPTUM SURGERY    . PORT-A-CATH REMOVAL Left 12/01/2010  . PORTACATH PLACEMENT Left 09/12/2009  . REDUCTION MAMMAPLASTY Left 08/2013  . REMOVAL OF TISSUE EXPANDER AND PLACEMENT OF IMPLANT Right 08/23/2013   Procedure: REMOVAL RIGHT TISSUE EXPANDER AND PLACEMENT OF IMPLANT TO RIGHT BREAST ;  Surgeon: Theodoro Kos, DO;  Location: [place];  Service: Plastics;  Laterality: Right;  . SUPRA-UMBILICAL HERNIA  8295  . TUBAL LIGATION  11/21/2008   Past Medical History:  Diagnosis Date  . Anxiety   . Cancer ([place]) 2011   Rt. Br. Ca  . Depression   . Essential hypertension 11/05/2015  . History of breast cancer 2011   right  . History of chemotherapy 2011  . History of radiation therapy 2011  . Hypertension    under control with med., has been on med. x 2 yr.  . Lymphedema of arm    right; no BP or puncture to right arm  . Personal history of chemotherapy 09/16/2009   rt breast  . Personal history of radiation therapy 05/2010   rt breast  . Pneumonia   . Seasonal allergies   . Sinus headache    BP 134/86   Pulse (!) 108   Ht 5\' 4"  (1.626 m)   Wt 230 lb (104.3 kg)   LMP 07/22/2014 (Approximate)   SpO2 98%   BMI 39.48 kg/m   Opioid Risk Score:   Fall Risk Score:  `1  Depression screen PHQ 2/9  Depression screen [facility] 2/9 08/23/2017 08/31/2016 07/22/2016 07/20/2016 07/08/2016  Decreased Interest 1 0 0 0 0  Down, Depressed, Hopeless 1 0 0 0 0  PHQ - 2 Score 2 0 0 0 0  Some recent data might be hidden     Review of Systems  Constitutional: Positive for appetite change.  HENT: Negative.   Eyes: Negative.    Respiratory: Negative.   Cardiovascular: Negative.   Gastrointestinal: Negative.   Endocrine: Negative.   Genitourinary: Negative.   Musculoskeletal: Positive for arthralgias, gait problem and myalgias.  Skin: Negative.   Allergic/Immunologic: Negative.   Neurological: Positive for weakness and numbness.  Hematological: Negative.   Psychiatric/Behavioral: Positive for dysphoric mood. The patient is nervous/anxious.   All other systems reviewed and are negative.      Objective:   Physical Exam  Constitutional:  She is oriented to [place], place, and time. She appears well-developed and well-nourished.  HENT:  Head: Normocephalic and atraumatic.  Neck: Normal range of motion. Neck supple.  Cervical Paraspinal Tenderness: C-5-C-6  Cardiovascular: Normal rate and regular rhythm.  Pulmonary/Chest: Effort normal and breath sounds normal.  Musculoskeletal:  Normal Muscle Bulk and Muscle Testing Reveals:  Upper Extremities: Right: Decreased ROM 30 Degrees and Muscle Strength 5/5 Wearing Compression Sleeve Left: Full ROM and Muscle Strength 5/5 Right AC Joint Tenderness Lumbar Paraspinal Tenderness: L-3-L-5 Lower Extremities: Decreased ROM and Muscle Strength 4/5 Bilateral Lower Extremities Flexion Produces Pain into Bilateral Patella's Arises from Table Slowly Narrow Based gait  Neurological: She is alert and oriented to [place], place, and time.  Skin: Skin is warm and dry.  Psychiatric: She has a normal mood and affect.  Nursing note and vitals reviewed.         Assessment & Plan:  1.Myofascial pain syndrome chronic postoperative, as well as post radiation: S/P Breast Reconstruction Surgery x 2. Continue to Monitor.11/23/2017. Continue current medication regime: Refilled: Oxycodone 5 mg one tablet every 6 hours as needed for severe pain #120.Continue HEP. We will continue the opioid monitoring program, this consists of regular clinic visits, examinations, urine drug  screen, pill counts as well as use of [place] Controlled Substance Reporting System. 2. Neoplasm Related Pain: Continue Current Medication Regime. 11/23/2017 3. Lymphedema of Right Upper Extremity: Oncology Following. 11/23/2017 4.Cervicalgia/Cervical Dystonia: Schedule for Botox Injection with Dr. Letta Pate in August. 11/23/2017 5. Obesity: Continue with Healthy Diet and Exercise Regime. 11/23/2017 6. Muscle Spasms: Continue current medication regime Tizanidine. 11/23/2017 7. Depression: Continue Cymbalta and Counseling at [place]. 11/23/2017 8.Chronic Bilateral Knee Pai/ OA: Continue VoltarenGel. 11/23/2017 9.Chronic Midline/ Low Back Bain/ Bilateral Low Back Pain: Continue with HEP as Tolerated. Continue current medication regimen. 11/23/2017 10. Neuropathic Pain: Continue  Gabapentin: 11/23/2017 11. Opioid Induced Constipation: Movantik not on her Drug Formulary, RX: Amitiza 12. Falls: Educated on Falls Prevention: She will make an appointment with her Neurologist Dr. Jaynee Eagles she reports. 11/23/2017  20 minutes of face to face patient care time was spent during this visit. All questions were encouraged and answered.   F/U in 1 month

## 2017-12-06 ENCOUNTER — Inpatient Hospital Stay: Payer: Medicare Other

## 2017-12-06 ENCOUNTER — Ambulatory Visit: Payer: Medicare Other

## 2017-12-06 ENCOUNTER — Telehealth: Payer: Self-pay

## 2017-12-06 ENCOUNTER — Inpatient Hospital Stay: Payer: Medicare Other | Attending: Oncology

## 2017-12-06 ENCOUNTER — Ambulatory Visit: Payer: Medicare Other | Admitting: Family Medicine

## 2017-12-06 DIAGNOSIS — Z171 Estrogen receptor negative status [ER-]: Secondary | ICD-10-CM | POA: Insufficient documentation

## 2017-12-06 DIAGNOSIS — C50011 Malignant neoplasm of nipple and areola, right female breast: Secondary | ICD-10-CM | POA: Insufficient documentation

## 2017-12-06 DIAGNOSIS — Z5111 Encounter for antineoplastic chemotherapy: Secondary | ICD-10-CM | POA: Diagnosis not present

## 2017-12-06 MED ORDER — GOSERELIN ACETATE 3.6 MG ~~LOC~~ IMPL
3.6000 mg | DRUG_IMPLANT | Freq: Once | SUBCUTANEOUS | Status: AC
  Administered 2017-12-06: 3.6 mg via SUBCUTANEOUS

## 2017-12-06 MED ORDER — GOSERELIN ACETATE 3.6 MG ~~LOC~~ IMPL
DRUG_IMPLANT | SUBCUTANEOUS | Status: AC
  Filled 2017-12-06: qty 3.6

## 2017-12-06 NOTE — Progress Notes (Unsigned)
Ok to give Zoladex early per Dr. Jana Hakim

## 2017-12-06 NOTE — Patient Instructions (Signed)
Goserelin injection What is this medicine? GOSERELIN (GOE se rel in) is similar to a hormone found in the body. It lowers the amount of sex hormones that the body makes. Men will have lower testosterone levels and women will have lower estrogen levels while taking this medicine. In men, this medicine is used to treat prostate cancer; the injection is either given once per month or once every 12 weeks. A once per month injection (only) is used to treat women with endometriosis, dysfunctional uterine bleeding, or advanced breast cancer. This medicine may be used for other purposes; ask your health care provider or pharmacist if you have questions. What should I tell my health care provider before I take this medicine? They need to know if you have any of these conditions (some only apply to women): -diabetes -heart disease or previous heart attack -high blood pressure -high cholesterol -kidney disease -osteoporosis or low bone density -problems passing urine -spinal cord injury -stroke -tobacco smoker -an unusual or allergic reaction to goserelin, hormone therapy, other medicines, foods, dyes, or preservatives -pregnant or trying to get pregnant -breast-feeding How should I use this medicine? This medicine is for injection under the skin. It is given by a health care professional in a hospital or clinic setting. Men receive this injection once every 4 weeks or once every 12 weeks. Women will only receive the once every 4 weeks injection. Talk to your pediatrician regarding the use of this medicine in children. Special care may be needed. Overdosage: If you think you have taken too much of this medicine contact a poison control center or emergency room at once. NOTE: This medicine is only for you. Do not share this medicine with others. What if I miss a dose? It is important not to miss your dose. Call your doctor or health care professional if you are unable to keep an appointment. What may  interact with this medicine? -female hormones like estrogen -herbal or dietary supplements like black cohosh, chasteberry, or DHEA -female hormones like testosterone -prasterone This list may not describe all possible interactions. Give your health care provider a list of all the medicines, herbs, non-prescription drugs, or dietary supplements you use. Also tell them if you smoke, drink alcohol, or use illegal drugs. Some items may interact with your medicine. What should I watch for while using this medicine? Visit your doctor or health care professional for regular checks on your progress. Your symptoms may appear to get worse during the first weeks of this therapy. Tell your doctor or healthcare professional if your symptoms do not start to get better or if they get worse after this time. Your bones may get weaker if you take this medicine for a long time. If you smoke or frequently drink alcohol you may increase your risk of bone loss. A family history of osteoporosis, chronic use of drugs for seizures (convulsions), or corticosteroids can also increase your risk of bone loss. Talk to your doctor about how to keep your bones strong. This medicine should stop regular monthly menstration in women. Tell your doctor if you continue to menstrate. Women should not become pregnant while taking this medicine or for 12 weeks after stopping this medicine. Women should inform their doctor if they wish to become pregnant or think they might be pregnant. There is a potential for serious side effects to an unborn child. Talk to your health care professional or pharmacist for more information. Do not breast-feed an infant while taking this medicine. Men should   inform their doctors if they wish to father a child. This medicine may lower sperm counts. Talk to your health care professional or pharmacist for more information. What side effects may I notice from receiving this medicine? Side effects that you should  report to your doctor or health care professional as soon as possible: -allergic reactions like skin rash, itching or hives, swelling of the face, lips, or tongue -bone pain -breathing problems -changes in vision -chest pain -feeling faint or lightheaded, falls -fever, chills -pain, swelling, warmth in the leg -pain, tingling, numbness in the hands or feet -signs and symptoms of low blood pressure like dizziness; feeling faint or lightheaded, falls; unusually weak or tired -stomach pain -swelling of the ankles, feet, hands -trouble passing urine or change in the amount of urine -unusually high or low blood pressure -unusually weak or tired Side effects that usually do not require medical attention (report to your doctor or health care professional if they continue or are bothersome): -change in sex drive or performance -changes in breast size in both males and females -changes in emotions or moods -headache -hot flashes -irritation at site where injected -loss of appetite -skin problems like acne, dry skin -vaginal dryness This list may not describe all possible side effects. Call your doctor for medical advice about side effects. You may report side effects to FDA at 1-800-FDA-1088. Where should I keep my medicine? This drug is given in a hospital or clinic and will not be stored at home. NOTE: This sheet is a summary. It may not cover all possible information. If you have questions about this medicine, talk to your doctor, pharmacist, or health care provider.    2016, Elsevier/Gold Standard. (2013-07-31 11:10:35)  

## 2017-12-06 NOTE — Telephone Encounter (Signed)
Pt called stating that she will need to reschedule her injection appt at 11:30am to this afternoon if at all possible. Pt states that she is having a lot of pain this morning and will need to pick up a family member prior to coming. Rescheduled pt for 2pm today. Notified injection nurse and is aware. Per Dr.Magrinat, okay to give injection early. Its been 21 days since last injection. Pt zoladex q28days.   Pt also wanted to know if she is able to receive her zoladex injections earlier to help with her pain. She does better with injections every 21-23 days. Next appt will be 24days from today. Will send scheduling request to bump up her injection appt.

## 2017-12-13 ENCOUNTER — Ambulatory Visit: Payer: Medicare Other

## 2017-12-16 ENCOUNTER — Encounter: Payer: Medicare Other | Admitting: Registered Nurse

## 2017-12-19 ENCOUNTER — Encounter: Payer: Self-pay | Admitting: Registered Nurse

## 2017-12-19 ENCOUNTER — Other Ambulatory Visit: Payer: Self-pay

## 2017-12-19 ENCOUNTER — Encounter: Payer: Medicare Other | Attending: Registered Nurse | Admitting: Registered Nurse

## 2017-12-19 VITALS — BP 111/70 | HR 94 | Ht 64.0 in | Wt 231.6 lb

## 2017-12-19 DIAGNOSIS — M542 Cervicalgia: Secondary | ICD-10-CM | POA: Diagnosis not present

## 2017-12-19 DIAGNOSIS — M545 Low back pain, unspecified: Secondary | ICD-10-CM

## 2017-12-19 DIAGNOSIS — Z79899 Other long term (current) drug therapy: Secondary | ICD-10-CM

## 2017-12-19 DIAGNOSIS — Z5181 Encounter for therapeutic drug level monitoring: Secondary | ICD-10-CM | POA: Diagnosis not present

## 2017-12-19 DIAGNOSIS — G893 Neoplasm related pain (acute) (chronic): Secondary | ICD-10-CM

## 2017-12-19 DIAGNOSIS — G894 Chronic pain syndrome: Secondary | ICD-10-CM | POA: Diagnosis not present

## 2017-12-19 DIAGNOSIS — F3289 Other specified depressive episodes: Secondary | ICD-10-CM

## 2017-12-19 DIAGNOSIS — I890 Lymphedema, not elsewhere classified: Secondary | ICD-10-CM

## 2017-12-19 DIAGNOSIS — G243 Spasmodic torticollis: Secondary | ICD-10-CM | POA: Diagnosis not present

## 2017-12-19 DIAGNOSIS — M546 Pain in thoracic spine: Secondary | ICD-10-CM | POA: Diagnosis not present

## 2017-12-19 DIAGNOSIS — M792 Neuralgia and neuritis, unspecified: Secondary | ICD-10-CM

## 2017-12-19 DIAGNOSIS — R2681 Unsteadiness on feet: Secondary | ICD-10-CM | POA: Diagnosis not present

## 2017-12-19 MED ORDER — OXYCODONE HCL 5 MG PO TABS: 5.0000 mg | ORAL_TABLET | Freq: Four times a day (QID) | ORAL | 0 refills | Status: DC | PRN

## 2017-12-19 NOTE — Progress Notes (Signed)
Subjective:    Patient ID: Karen Hopkins, female    DOB: March 31, 1976, 42 y.o.   MRN: [id number]  HPI: [name] is a 42 year old female who returns for follow up appointment for chronic pain and medication refill. She states her pain is located in her neck radiating into right shoulder and mid-lower back. She rates her pain 8. Her current exercise regime.   [name] Equivalent is 30.00 MME. She is also prescribed Clonazepam by Lorelle Gibbs NP .We have discussed the black box warning of using opioids and benzodiazepines.  highlighted the dangers of using these drugs together and discussed the adverse events including respiratory suppression, overdose, cognitive impairment and importance of compliance with current regimen. We will continue to monitor and adjust as indicated. She is being closely monitored and under the care of her psychiatrist at the [facility].   Last UDS was Performed on 07/14/2017, it was consistent. UDS ordered today.  Pain Inventory Average Pain 6 Pain Right Now 8 My pain is sharp, burning, dull, stabbing, tingling and aching  In the last 24 hours, has pain interfered with the following? General activity 6 Relation with others 6 Enjoyment of life 6 What TIME of day is your pain at its worst? all Sleep (in general) Poor  Pain is worse with: walking, bending, sitting, inactivity, standing and some activites Pain improves with: heat/ice, therapy/exercise, medication and injections Relief from Meds: 7  Mobility walk without assistance ability to climb steps?  no do you drive?  yes  Function disabled: date disabled .  Neuro/Psych bladder control problems weakness numbness tremor spasms dizziness confusion depression anxiety  Prior Studies Any changes since last visit?  no  Physicians involved in your care Any changes since last visit?  no   Family History  Problem Relation Age of Onset  . Hypertension Mother   . Breast  cancer Mother 50  . Diabetes type II Father   . Prostate cancer Father   . Stroke Neg Hx    Social History   Socioeconomic History  . Marital status: Single    Spouse name: Not on file  . Number of children: 3  . Years of education: 18  . Highest education level: Not on file  Occupational History  . Occupation: Disability   Social Needs  . Financial resource strain: Not on file  . Food insecurity:    Worry: Not on file    Inability: Not on file  . Transportation needs:    Medical: Not on file    Non-medical: Not on file  Tobacco Use  . Smoking status: Former Smoker    Packs/day: 0.25    Years: 5.00    Pack years: 1.25    Types: Cigarettes    Start date: 06/07/2002    Last attempt to quit: 04/26/2010    Years since quitting: 7.6  . Smokeless tobacco: Never Used  Substance and Sexual Activity  . Alcohol use: No  . Drug use: No  . Sexual activity: Not Currently    Birth control/protection: Surgical  Lifestyle  . Physical activity:    Days per week: Not on file    Minutes per session: Not on file  . Stress: Not on file  Relationships  . Social connections:    Talks on phone: Not on file    Gets together: Not on file    Attends religious service: Not on file    Active member of club or organization:  Not on file    Attends meetings of clubs or organizations: Not on file    Relationship status: Not on file  Other Topics Concern  . Not on file  Social History Narrative   Lives with kids   Caffeine use: none    Past Surgical History:  Procedure Laterality Date  . BREAST BIOPSY Right 08/26/2009  . BREAST REDUCTION SURGERY Left 08/23/2013   Procedure: LEFT BREAST REDUCTION  ;  Surgeon: Theodoro Kos, DO;  Location: [place];  Service: Plastics;  Laterality: Left;  . CESAREAN SECTION  07/15/2001; 03/18/2004; 11/21/2008  . lateral orbiotomy Left 11/2015   [facility] Toy Co[facility]key at [facility]  . LATISSIMUS FLAP TO BREAST Right 03/12/2013   Procedure: RIGHT BREAST LATISSIMUS  FLAP WITH EXPANDER PLACEMENT;  Surgeon: Theodoro Kos, DO;  Location: [place];  Service: Plastics;  Laterality: Right;  . LIPOSUCTION Bilateral 08/23/2013   Procedure: LIPOSUCTION;  Surgeon: Theodoro Kos, DO;  Location: [place];  Service: Plastics;  Laterality: Bilateral;  . MASTECTOMY Right 2011  . MODIFIED RADICAL MASTECTOMY Right 03/11/2010  . NASAL SEPTUM SURGERY    . PORT-A-CATH REMOVAL Left 12/01/2010  . PORTACATH PLACEMENT Left 09/12/2009  . REDUCTION MAMMAPLASTY Left 08/2013  . REMOVAL OF TISSUE EXPANDER AND PLACEMENT OF IMPLANT Right 08/23/2013   Procedure: REMOVAL RIGHT TISSUE EXPANDER AND PLACEMENT OF IMPLANT TO RIGHT BREAST ;  Surgeon: Theodoro Kos, DO;  Location: [place];  Service: Plastics;  Laterality: Right;  . SUPRA-UMBILICAL HERNIA  1601  . TUBAL LIGATION  11/21/2008   Past Medical History:  Diagnosis Date  . Anxiety   . Cancer ([place]) 2011   Rt. Br. Ca  . Depression   . Essential hypertension 11/05/2015  . History of breast cancer 2011   right  . History of chemotherapy 2011  . History of radiation therapy 2011  . Hypertension    under control with med., has been on med. x 2 yr.  . Lymphedema of arm    right; no BP or puncture to right arm  . Personal history of chemotherapy 09/16/2009   rt breast  . Personal history of radiation therapy 05/2010   rt breast  . Pneumonia   . Seasonal allergies   . Sinus headache    BP 111/70   Pulse 94   Ht 5\' 4"  (1.626 m)   Wt 231 lb 9.6 oz (105.1 kg)   LMP 07/22/2014 (Approximate)   SpO2 100%   BMI 39.75 kg/m   Opioid Risk Score:   Fall Risk Score:  `1  Depression screen PHQ 2/9  Depression screen [facility] 2/9 12/19/2017 08/23/2017 08/31/2016 07/22/2016 07/20/2016 07/08/2016  Decreased Interest 3 1 0 0 0 0  Down, Depressed, Hopeless 3 1 0 0 0 0  PHQ - 2 Score 6 2 0 0 0 0  Some recent data might be hidden   Review of Systems  Constitutional: Positive for diaphoresis.  HENT: Negative.   Eyes:  Negative.   Respiratory: Negative.   Cardiovascular: Negative.   Gastrointestinal: Positive for constipation.  Endocrine: Negative.   Genitourinary: Negative.   Musculoskeletal:       Spasms  Skin: Negative.   Neurological: Positive for dizziness, tremors, weakness and numbness.  Hematological: Negative.   Psychiatric/Behavioral: Positive for confusion. The patient is nervous/anxious.   All other systems reviewed and are negative.      Objective:   Physical Exam  Constitutional: She is oriented to [place], place, and time. She appears  well-developed and well-nourished.  HENT:  Head: Normocephalic and atraumatic.  Neck: Normal range of motion. Neck supple.  Cervical Paraspinal Tenderness: C-5-C-6  Cardiovascular: Normal rate and regular rhythm.  Pulmonary/Chest: Effort normal and breath sounds normal.  Musculoskeletal:  Normal Muscle Bulk and Muscle Testing Reveals: Upper Extremities: Right: Decreased ROM 45 Degrees and Muscle Strength 4/5 Right AC Joint Tenderness Left: Full ROM and Muscle Strength 5/5 Thoracic Paraspinal Tenderness: T-3-T-7 Lumbar Paraspinal Tenderness: L-3-L-5 Lower Extremities: Full ROM and Muscle Strength 5/5 Arises from Table Slowly Narrow Based Gait   Neurological: She is alert and oriented to [place], place, and time.  Skin: Skin is warm and dry.  Psychiatric: She has a normal mood and affect. Her behavior is normal.  Nursing note and vitals reviewed.         Assessment & Plan:  1.Myofascial pain syndrome chronic postoperative, as well as post radiation: S/P Breast Reconstruction Surgery x 2. Continue to Monitor.12/19/2017. Continue current medication regime: Refilled: Oxycodone 5 mg one tablet every 6 hours as needed for severe pain #120.Continue HEP. We will continue the opioid monitoring program, this consists of regular clinic visits, examinations, urine drug screen, pill counts as well as use of [place] Controlled Substance  Reporting System. 2. Neoplasm Related Pain: Continue Current Medication Regime. 12/19/2017 3. Lymphedema of Right Upper Extremity: Oncology Following. 12/19/2017 4.Cervicalgia/Cervical Dystonia: Schedule forBotox Injection with Dr. Gerline Legacy August. 12/19/2017 5. Obesity: Continue with Healthy Diet and Exercise Regime. 12/19/2017 6. Muscle Spasms: Continuecurrent medication regimeTizanidine. 12/19/2017 7. Depression: Continue Cymbalta and Counseling at [place]. 12/19/2017 8.Chronic Bilateral Knee Pai/ OA: Continue VoltarenGel. 12/19/2017 9.Chronic Midline/ Low Back Bain/ Bilateral Low Back Pain: Continue with HEP as Tolerated. Continue current medication regimen.12/19/2017 10. Neuropathic Pain: Continue  Gabapentin:12/19/2017 11. Opioid Induced Constipation: Movantik not on her Drug Formulary: Continue: Amitiza. 12/19/2017.  20 minutes of face to face patient care time was spent during this visit. All questions were encouraged and answered.   F/U in 1 month

## 2017-12-20 ENCOUNTER — Ambulatory Visit: Payer: Medicare Other | Admitting: Family Medicine

## 2017-12-23 LAB — TOXASSURE SELECT,+ANTIDEPR,UR

## 2017-12-26 ENCOUNTER — Telehealth: Payer: Self-pay | Admitting: *Deleted

## 2017-12-26 NOTE — Telephone Encounter (Signed)
Urine drug screen for this encounter is consistent for prescribed medication 

## 2017-12-29 ENCOUNTER — Encounter: Payer: Self-pay | Admitting: Oncology

## 2017-12-30 ENCOUNTER — Inpatient Hospital Stay: Payer: Medicare Other

## 2017-12-30 ENCOUNTER — Other Ambulatory Visit: Payer: Self-pay | Admitting: Family Medicine

## 2017-12-30 DIAGNOSIS — C50011 Malignant neoplasm of nipple and areola, right female breast: Secondary | ICD-10-CM

## 2017-12-30 DIAGNOSIS — Z5111 Encounter for antineoplastic chemotherapy: Secondary | ICD-10-CM | POA: Diagnosis not present

## 2017-12-30 DIAGNOSIS — Z171 Estrogen receptor negative status [ER-]: Secondary | ICD-10-CM | POA: Diagnosis not present

## 2017-12-30 MED ORDER — GOSERELIN ACETATE 3.6 MG ~~LOC~~ IMPL
3.6000 mg | DRUG_IMPLANT | Freq: Once | SUBCUTANEOUS | Status: AC
  Administered 2017-12-30: 3.6 mg via SUBCUTANEOUS

## 2017-12-30 MED ORDER — GOSERELIN ACETATE 3.6 MG ~~LOC~~ IMPL
DRUG_IMPLANT | SUBCUTANEOUS | Status: AC
  Filled 2017-12-30: qty 3.6

## 2017-12-30 NOTE — Telephone Encounter (Signed)
Patient has no showed to two appts with me this month. I have not seen her since April 2018. Will not refill at this time.  She needs appointment before any further refills will be given.  [name], MD

## 2018-01-02 NOTE — Telephone Encounter (Signed)
Called patient and made appointment per Dr. Ardelia Mems. Patient states that she has a problem remembering the appointments because she has so many regarding her cancer. Patient states that she will try to remember this one as she wants to find out if she really needs metformin.  Karen Hopkins, [place]

## 2018-01-03 ENCOUNTER — Ambulatory Visit: Payer: Medicare Other

## 2018-01-08 ENCOUNTER — Other Ambulatory Visit: Payer: Self-pay | Admitting: Oncology

## 2018-01-10 ENCOUNTER — Ambulatory Visit: Payer: Medicare Other | Admitting: Family Medicine

## 2018-01-10 ENCOUNTER — Ambulatory Visit: Payer: Medicare Other

## 2018-01-12 ENCOUNTER — Ambulatory Visit (HOSPITAL_BASED_OUTPATIENT_CLINIC_OR_DEPARTMENT_OTHER): Payer: Medicare Other | Admitting: Physical Medicine & Rehabilitation

## 2018-01-12 ENCOUNTER — Encounter: Payer: Self-pay | Admitting: Physical Medicine & Rehabilitation

## 2018-01-12 VITALS — BP 105/72 | HR 85 | Ht 64.0 in | Wt 236.4 lb

## 2018-01-12 DIAGNOSIS — I890 Lymphedema, not elsewhere classified: Secondary | ICD-10-CM | POA: Diagnosis not present

## 2018-01-12 DIAGNOSIS — M546 Pain in thoracic spine: Secondary | ICD-10-CM | POA: Insufficient documentation

## 2018-01-12 DIAGNOSIS — G243 Spasmodic torticollis: Secondary | ICD-10-CM | POA: Diagnosis not present

## 2018-01-12 NOTE — Patient Instructions (Signed)

## 2018-01-12 NOTE — Progress Notes (Signed)
Dysport Injection for cervical dystonia using needle EMG guidance  Dilution:  150Units/ml Indication: Severe spasticity which interferes with ADL,mobility and/or  hygiene and is unresponsive to medication management and other conservative care Informed consent was obtained after describing risks and benefits of the procedure with the patient. This includes bleeding, bruising, infection, excessive weakness, or medication side effects. A REMS form is on file and signed. Needle:  needle electrode Number of units per muscle Right Trapezius, 75 Right levator, 75 units Right sternocleidomastoid, 75units Right scalene 37.5 units ant 37.5 middle  All injections were done after obtaining appropriate EMG activity and after negative drawback for blood. The patient tolerated the procedure well. Post procedure instructions were given. A followup appointment was made.

## 2018-01-16 ENCOUNTER — Other Ambulatory Visit: Payer: Self-pay

## 2018-01-16 ENCOUNTER — Ambulatory Visit: Payer: Medicare Other | Attending: Registered Nurse | Admitting: Physical Therapy

## 2018-01-16 ENCOUNTER — Encounter: Payer: Self-pay | Admitting: Physical Therapy

## 2018-01-16 DIAGNOSIS — R2681 Unsteadiness on feet: Secondary | ICD-10-CM | POA: Diagnosis not present

## 2018-01-16 DIAGNOSIS — R262 Difficulty in walking, not elsewhere classified: Secondary | ICD-10-CM | POA: Insufficient documentation

## 2018-01-16 DIAGNOSIS — R296 Repeated falls: Secondary | ICD-10-CM

## 2018-01-16 DIAGNOSIS — R208 Other disturbances of skin sensation: Secondary | ICD-10-CM | POA: Insufficient documentation

## 2018-01-17 NOTE — Therapy (Signed)
[facility] [address] [place], [place], [address] Phone: [phone]   Fax:  [phone]  Physical Therapy Evaluation  Patient Details  Name: Karen Hopkins MRN: [id number] Date of Birth: 04/23/1976 Referring Provider: Bayard Hugger, NP   Encounter Date: 01/16/2018  PT End of Session - 01/17/18 1305    Visit Number  1    Number of Visits  17    Date for PT Re-Evaluation  03/18/18    Authorization Type  [employer] Medicare and Medicaid - pt has no financial responsibility    PT Start Time  1018    PT Stop Time  1103    PT Time Calculation (min)  45 min    Activity Tolerance  Patient tolerated treatment well    Behavior During Therapy  [facility] for tasks assessed/performed       Past Medical History:  Diagnosis Date  . Anxiety   . Cancer ([place]) 2011   Rt. Br. Ca  . Depression   . Essential hypertension 11/05/2015  . History of breast cancer 2011   right  . History of chemotherapy 2011  . History of radiation therapy 2011  . Hypertension    under control with med., has been on med. x 2 yr.  . Lymphedema of arm    right; no BP or puncture to right arm  . Personal history of chemotherapy 09/16/2009   rt breast  . Personal history of radiation therapy 05/2010   rt breast  . Pneumonia   . Seasonal allergies   . Sinus headache     Past Surgical History:  Procedure Laterality Date  . BREAST BIOPSY Right 08/26/2009  . BREAST REDUCTION SURGERY Left 08/23/2013   Procedure: LEFT BREAST REDUCTION  ;  Surgeon: Theodoro Kos, DO;  Location: [facility];  Service: Plastics;  Laterality: Left;  . CESAREAN SECTION  07/15/2001; 03/18/2004; 11/21/2008  . lateral orbiotomy Left 11/2015   Dr. Toy Cookey at [facility]  . LATISSIMUS FLAP TO BREAST Right 03/12/2013   Procedure: RIGHT BREAST LATISSIMUS FLAP WITH EXPANDER PLACEMENT;  Surgeon: Theodoro Kos, DO;  Location: [facility];  Service: Plastics;  Laterality: Right;  . LIPOSUCTION  Bilateral 08/23/2013   Procedure: LIPOSUCTION;  Surgeon: Theodoro Kos, DO;  Location: [facility];  Service: Plastics;  Laterality: Bilateral;  . MASTECTOMY Right 2011  . MODIFIED RADICAL MASTECTOMY Right 03/11/2010  . NASAL SEPTUM SURGERY    . PORT-A-CATH REMOVAL Left 12/01/2010  . PORTACATH PLACEMENT Left 09/12/2009  . REDUCTION MAMMAPLASTY Left 08/2013  . REMOVAL OF TISSUE EXPANDER AND PLACEMENT OF IMPLANT Right 08/23/2013   Procedure: REMOVAL RIGHT TISSUE EXPANDER AND PLACEMENT OF IMPLANT TO RIGHT BREAST ;  Surgeon: Theodoro Kos, DO;  Location: [facility];  Service: Plastics;  Laterality: Right;  . SUPRA-UMBILICAL HERNIA  6073  . TUBAL LIGATION  11/21/2008    There were no vitals filed for this visit.   Subjective Assessment - 01/16/18 1025    Subjective  Pt referred to PT due to multiple falls, imbalance, unsteady gait and assessment for AD.  Pt has also been referred to neurology due to frequency of falls.  When standing pt tends to put most of her weight on LLE.  Also just had injection to R neck muscles for mm tension/dystonia.    Pertinent History  RUE lympedema - RUE restricted for BP readings.   anxiety and depression, chronic use of opioids, neuropathic pain, bilat knee OA/pain, low back  pain, cervical dystonia treated with injection, essential HTN, R breast cancer with R radical mastectomy, chemo, radiation and lymphedema.    Limitations  Walking    Diagnostic tests  x-ray - OA bilat knees and hips    Patient Stated Goals  To get a cane, to walk straight and have decreased falls    Currently in Pain?  Yes    Pain Location  Leg    Pain Orientation  Right;Left    Pain Descriptors / Indicators  Sharp;Burning;Numbness    Pain Radiating Towards  hips to feet     Pain Onset  Other (comment)   years ago after chemo        [facility] PT Assessment - 01/16/18 1035      Assessment   Medical Diagnosis  unsteady gait, multiple falls    Referring Provider   Bayard Hugger, NP    Next MD Visit  has referral to neurology but does not have appointment yet    Prior Therapy  at [facility] center for l[facility]hedema      Precautions   Precautions  Other (comment);Fall    Precaution Comments   anxiety and depression, chronic use of opioids, neuropathic pain, bilat knee OA/pain, low back pain, cervical dystonia treated with injection, essential HTN, R breast cancer with R radical mastectomy, chemo, radiation and lymphedema - RUE restricted for BP      Balance Screen   Has the patient fallen in the past 6 months  Yes    How many times?  >10    Has the patient had a decrease in activity level because of a fear of falling?   No    Is the patient reluctant to leave their home because of a fear of falling?   No      Home Environment   Living Environment  Private residence    Living Arrangements  Children    Type of [place] Access  Level entry    [place]  None    Additional Comments  3 boys - play football and basketball.        Prior Function   Level of Independence  Needs assistance with homemaking;Needs assistance with ADLs;Other (comment)   had in home aide for household chores/showering   Vocation  On disability    Leisure  going to [employer] football and basketball games but fatigues quickly    Comments  Since losing home aide she is doing more household tasks but has experienced an increase in falls      Observation/Other Assessments   Focus on Therapeutic Outcomes (FOTO)   not indicated      Observation/Other Assessments-Edema    Edema  --   RUE lymphedema     Sensation   Light Touch  Impaired Detail    Light Touch Impaired Details  Impaired RLE;Impaired LLE   absent in toes, L>R sensory loss   Hot/Cold  Impaired by gross assessment    Proprioception  Impaired by gross assessment    Additional Comments  pt reports burns on feet when in shower      ROM / Strength   AROM / PROM / Strength  Strength       Strength   Overall Strength  Deficits    Overall Strength Comments  LLE: 2/5 hip flexion, 3+/5 knee extension, ankle DF, knee flexion.  RLE: 4/5 hip flexion, knee extension, ankle DF, 3/5  knee flexion      Ambulation/Gait   Ambulation/Gait  Yes    Ambulation/Gait Assistance  5: Supervision    Ambulation Distance (Feet)  115 Feet    Assistive device  None    Gait Pattern  Step-through pattern;Decreased stride length;Decreased dorsiflexion - right;Decreased dorsiflexion - left;Right foot flat;Left foot flat;Lateral hip instability    Ambulation Surface  Level;Indoor    Stairs  No      Standardized Balance Assessment   Standardized Balance Assessment  Berg Balance Test;Five Times Sit to Stand;10 meter walk test    Five times sit to stand comments   19.63 seconds from mat with use of UE    10 Meter Walk  13 seconds or 2.45 ft/sec      Berg Balance Test   Berg comment:  TBA                Objective measurements completed on examination: See above findings.              PT Education - 01/17/18 1304    Education Details  clinical findings, PT POC, goals, peripheral neuropathy presentation and falls risk, risk of skin injury/burns with impaired sensation and importance of testing water temperature with L elbow, will need order from physician for cane, importance of following up on referral to neurology and making appointment with neurology    Vohs(s) Educated  Patient    Methods  Explanation    Comprehension  Verbalized understanding       PT Short Term Goals - 01/17/18 1309      PT SHORT TERM GOAL #1   Title  Pt will participate in further assessment of balance with BERG    Time  4    Period  Weeks    Status  New    Target Date  02/16/18      PT SHORT TERM GOAL #2   Title  Pt will improve LE strength as indicated by decrease in five time sit to stand to </= 15 seconds from mat without use of UE    Baseline  19 seconds from mat    Time  4    Period   Weeks    Status  New    Target Date  02/16/18      PT SHORT TERM GOAL #3   Title  Pt will improve gait velocity to >/= 2.6 ft/sec with LRAD to decrease risk for falls in home/community    Baseline  2.45    Time  4    Period  Weeks    Status  New    Target Date  02/16/18      PT SHORT TERM GOAL #4   Title  Pt will ambulate with LRAD x 200' over outdoor surfaces with supervision to improve safety when ambulating at son's football games    Time  4    Period  Weeks    Status  New    Target Date  02/16/18      PT SHORT TERM GOAL #5   Title  Pt will report 25% reduction in # of falls/near falls     Time  4    Period  Weeks    Status  New        PT Long Term Goals - 01/17/18 1314      PT LONG TERM GOAL #1   Title  Pt will demonstrate independence with balance and strength HEP    Time  8  Period  Weeks    Status  New    Target Date  03/18/18      PT LONG TERM GOAL #2   Title  Pt will report 70% reduction in falls at home (when performing ADL/chores) and in the community    Time  8    Period  Weeks    Status  New    Target Date  03/18/18      PT LONG TERM GOAL #3   Title  Pt will improve five time sit to stand to </= 13 seconds without use of UE    Time  8    Period  Weeks    Status  New    Target Date  03/18/18      PT LONG TERM GOAL #4   Title  Will increase gait velocity to >/= 3.0 ft/sec with LRAD    Time  8    Period  Weeks    Status  New    Target Date  03/18/18      PT LONG TERM GOAL #5   Title  Pt will increase BERG score by 8 points    Baseline  TBD    Time  8    Period  Weeks    Status  New    Target Date  03/18/18      Additional Long Term Goals   Additional Long Term Goals  Yes      PT LONG TERM GOAL #6   Title  Pt will ambulate x 500' over outdoor surface with LRAD and MOD I to improve independence with ambulation to son's football games    Time  8    Period  Weeks    Status  New    Target Date  03/18/18             Plan -  01/17/18 1306    Clinical Impression Statement  Pt is a 42 year old female referred to Neuro OPPT for evaluation of unsteady gait and need for assistive device due to multiple falls.  Pt's PMH is significant for the following: anxiety and depression, chronic use of opioids, neuropathic pain, bilat knee OA/pain, low back pain, cervical dystonia treated with injection, essential HTN, R breast cancer with R radical mastectomy, chemo, radiation and lymphedema - RUE restricted for BP.  The following deficits were noted during pt's exam: impaired UE and LE sensation including temperature and proprioception, impaired LE functional strength, impaired balance and impaired gait.  Pt's gait speed and five time sit to stand test results indicate pt is at risk for falls. Pt would benefit from skilled PT to address these impairments and functional limitations to maximize functional mobility independence and reduce falls risk.    History and Personal Factors relevant to plan of care:  patient is a single mother of 3 kids, multiple falls, anxiety and depression, chronic use of opioids, neuropathic pain, bilat knee OA/pain, low back pain, cervical dystonia treated with injection, essential HTN, R breast cancer with R radical mastectomy, chemo, radiation and lymphedema     Clinical Presentation  Evolving    Clinical Presentation due to:  patient is a single mother of 3 kids, multiple falls, anxiety and depression, chronic use of opioids, neuropathic pain, bilat knee OA/pain, low back pain, cervical dystonia treated with injection, essential HTN, R breast cancer with R radical mastectomy, chemo, radiation and lymphedema     Clinical Decision Making  Moderate    Rehab Potential  Good  PT Frequency  2x / week    PT Duration  8 weeks    PT Treatment/Interventions  ADLs/Self Care Home Management;Aquatic Therapy;Electrical Stimulation;DME Instruction;Gait training;Functional mobility training;Therapeutic activities;Therapeutic  exercise;Balance training;Neuromuscular re-education;Patient/family education;Orthotic Fit/Training    PT Next Visit Plan  assess BERG - reset goal baseline; assess gait with cane - get order for cane.  Initiate HEP    PT Home Exercise Plan  balance and balance reactions, LE strengthening    Consulted and Agree with Plan of Care  Patient       Patient will benefit from skilled therapeutic intervention in order to improve the following deficits and impairments:  Decreased balance, Decreased strength, Difficulty walking, Impaired sensation  Visit Diagnosis: Repeated falls  Unsteadiness on feet  Difficulty in walking, not elsewhere classified  Other disturbances of skin sensation     Problem List Patient Active Problem List   Diagnosis Date Noted  . Dyspnea 09/23/2016  . Eczema 08/31/2016  . Essential hypertension 11/05/2015  . Cervical dystonia 08/25/2015  . Headache 07/09/2015  . Myofascial muscle pain 04/29/2015  . Adhesive capsulitis of right shoulder 04/29/2015  . Type 2 diabetes mellitus without complication ([place]) 17/00/1749  . Lower extremity edema 10/01/2014  . Malignant neoplasm of overlapping sites of right breast in female, estrogen receptor positive ([place]) 05/24/2014  . Myalgia and myositis 05/17/2014  . Neoplasm related pain 03/12/2014  . CN (constipation) 11/26/2013  . H/O reduction mammoplasty 10/30/2013  . Seizure ([place]) 10/06/2013  . Status post bilateral breast implants 08/31/2013  . Acquired absence of breast and absent nipple 08/23/2013  . S/P breast reconstruction, right 08/23/2013  . AC joint arthropathy 07/02/2013  . Routine adult health maintenance 05/29/2013  . Hypokalemia 05/21/2013  . Chronic pain 04/19/2013  . Elevated blood sugar 03/09/2013  . Acute bronchitis 02/22/2013  . Lymphedema 05/24/2011  . RHINOSINUSITIS, CHRONIC 04/08/2010  . Obesity 08/04/2006  . DEPRESSIVE DISORDER, NOS 08/04/2006  . Allergic rhinitis 08/04/2006    Rico Junker, PT, DPT 01/17/18    1:19 PM    [facility] [address] [place], [place], [address] Phone: [phone]   Fax:  [phone]  Name: Karen Hopkins MRN: [id number] Date of Birth: [birth year]

## 2018-01-18 ENCOUNTER — Telehealth: Payer: Self-pay | Admitting: *Deleted

## 2018-01-18 NOTE — Progress Notes (Deleted)
   Subjective:   Patient ID: Karen Hopkins    DOB: 1975-08-29, 42 y.o. female   MRN: [id number]  Karen Hopkins is a 42 y.o. female with a history of T2DM, HTN, depression, right breast cancer (treated?),  here for ***  1. Type 2 DM: -currently on metformin 500mg  BID -last lipid panel in 2017 WNL -BP? -LE edema - compression stocking? Lasix 40mg ?  Disease Monitoring             Blood Sugar Ranges: Fasting - ***;  Random - ***.             Polyuria: ***             Visual problems: {YES/NO/WILD UUEKC:00349}   Urine Microalbumin ***  Last A1C: 5.6 in Feb. 2018  Medication Compliance: {YES/NO/WILD ZPHXT:05697}  Medication Side Effects             Hypoglycemia: {YES/NO/WILD XYIAX:65537}   Preventitive Health Care             Eye Exam: due             Foot Exam: due  ORDER: urine microalbumin, TDAP, ophthalmology exam, POC HgA1c, foot exam, lipid panel   Review of Systems:  Per HPI.   [place], medications and smoking status reviewed.  Objective:   LMP 07/22/2014 (Approximate)  Vitals and nursing note reviewed.  General: well nourished, well developed, in no acute distress with non-toxic appearance HEENT: normocephalic, atraumatic, moist mucous membranes Neck: supple, non-tender without lymphadenopathy CV: regular rate and rhythm without murmurs, rubs, or gallops, no lower extremity edema Lungs: clear to auscultation bilaterally with normal work of breathing Abdomen: soft, non-tender, non-distended, no masses or organomegaly palpable, normoactive bowel sounds Skin: warm, dry, no rashes or lesions Extremities: warm and well perfused, normal tone MSK: ROM grossly intact, strength intact, gait normal Neuro: Alert and oriented, speech normal  Diabetic Foot Check -  Appearance - no lesions, ulcers or calluses Skin - no unusual pallor or redness Monofilament testing - normal bilaterally  Right - Great toe, medial, central, lateral ball and posterior foot intact Left - Great  toe, medial, central, lateral ball and posterior foot intact  Assessment & Plan:   No problem-specific Assessment & Plan notes found for this encounter.  No orders of the defined types were placed in this encounter.  No orders of the defined types were placed in this encounter.   1. Type 2 DM: Cardiac: *** Renal: *** Eye: *** Foot: *** We discussed routine foot inspection at home, wearing supportive shoes, and avoiding going barefoot. Immunizations: ***  Mina Marble, DO PGY-1, [facility] 01/18/2018 7:53 PM

## 2018-01-18 NOTE — Telephone Encounter (Signed)
I have received multiple of these faxes, but patient has not come in for an appointment with me since April 2018. I cannot start her on a statin until she follows up for her diabetes. She canceled or no-showed to 3 appts with me since the beginning of July.  [name], MD

## 2018-01-18 NOTE — Telephone Encounter (Signed)
Gerald Stabs, RN @ [facility] calling to check if Dr. Ardelia Mems received fax requesting patient be started on a statin per 2013 cholesterol guidelines since she is diabetic.  She will resend fax in case it was not received.  Will route note to Dr. Ardelia Mems and await form to place in her box.  Burna Forts, BSN, RN-BC

## 2018-01-19 ENCOUNTER — Ambulatory Visit: Payer: Medicare Other | Admitting: Family Medicine

## 2018-01-23 ENCOUNTER — Ambulatory Visit: Payer: Medicare Other | Admitting: Physical Therapy

## 2018-01-23 ENCOUNTER — Ambulatory Visit: Payer: Medicare Other | Admitting: Physical Medicine & Rehabilitation

## 2018-01-23 ENCOUNTER — Telehealth: Payer: Self-pay | Admitting: Registered Nurse

## 2018-01-23 ENCOUNTER — Encounter: Payer: Medicare Other | Admitting: Registered Nurse

## 2018-01-23 NOTE — Telephone Encounter (Signed)
PATIENT NEEDS REFILL FOR MEDS - WAS SEEN FOR BOTOX 2 WEEKS AGO - CAN YOU CALL IN - SHE THINKS SHE RUNS OUT [phone]

## 2018-01-24 ENCOUNTER — Inpatient Hospital Stay: Payer: Medicare Other | Attending: Oncology

## 2018-01-24 VITALS — BP 132/81 | HR 79 | Temp 98.5°F | Resp 18

## 2018-01-24 DIAGNOSIS — C50011 Malignant neoplasm of nipple and areola, right female breast: Secondary | ICD-10-CM | POA: Diagnosis not present

## 2018-01-24 DIAGNOSIS — Z5111 Encounter for antineoplastic chemotherapy: Secondary | ICD-10-CM | POA: Diagnosis not present

## 2018-01-24 DIAGNOSIS — Z170 Estrogen receptor positive status [ER+]: Secondary | ICD-10-CM | POA: Diagnosis not present

## 2018-01-24 MED ORDER — GOSERELIN ACETATE 3.6 MG ~~LOC~~ IMPL
3.6000 mg | DRUG_IMPLANT | Freq: Once | SUBCUTANEOUS | Status: AC
  Administered 2018-01-24: 3.6 mg via SUBCUTANEOUS

## 2018-01-24 MED ORDER — GOSERELIN ACETATE 3.6 MG ~~LOC~~ IMPL
DRUG_IMPLANT | SUBCUTANEOUS | Status: AC
  Filled 2018-01-24: qty 3.6

## 2018-01-24 NOTE — Patient Instructions (Signed)
Goserelin injection What is this medicine? GOSERELIN (GOE se rel in) is similar to a hormone found in the body. It lowers the amount of sex hormones that the body makes. Men will have lower testosterone levels and women will have lower estrogen levels while taking this medicine. In men, this medicine is used to treat prostate cancer; the injection is either given once per month or once every 12 weeks. A once per month injection (only) is used to treat women with endometriosis, dysfunctional uterine bleeding, or advanced breast cancer. This medicine may be used for other purposes; ask your health care provider or pharmacist if you have questions. What should I tell my health care provider before I take this medicine? They need to know if you have any of these conditions (some only apply to women): -diabetes -heart disease or previous heart attack -high blood pressure -high cholesterol -kidney disease -osteoporosis or low bone density -problems passing urine -spinal cord injury -stroke -tobacco smoker -an unusual or allergic reaction to goserelin, hormone therapy, other medicines, foods, dyes, or preservatives -pregnant or trying to get pregnant -breast-feeding How should I use this medicine? This medicine is for injection under the skin. It is given by a health care professional in a hospital or clinic setting. Men receive this injection once every 4 weeks or once every 12 weeks. Women will only receive the once every 4 weeks injection. Talk to your pediatrician regarding the use of this medicine in children. Special care may be needed. Overdosage: If you think you have taken too much of this medicine contact a poison control center or emergency room at once. NOTE: This medicine is only for you. Do not share this medicine with others. What if I miss a dose? It is important not to miss your dose. Call your doctor or health care professional if you are unable to keep an appointment. What may  interact with this medicine? -female hormones like estrogen -herbal or dietary supplements like black cohosh, chasteberry, or DHEA -female hormones like testosterone -prasterone This list may not describe all possible interactions. Give your health care provider a list of all the medicines, herbs, non-prescription drugs, or dietary supplements you use. Also tell them if you smoke, drink alcohol, or use illegal drugs. Some items may interact with your medicine. What should I watch for while using this medicine? Visit your doctor or health care professional for regular checks on your progress. Your symptoms may appear to get worse during the first weeks of this therapy. Tell your doctor or healthcare professional if your symptoms do not start to get better or if they get worse after this time. Your bones may get weaker if you take this medicine for a long time. If you smoke or frequently drink alcohol you may increase your risk of bone loss. A family history of osteoporosis, chronic use of drugs for seizures (convulsions), or corticosteroids can also increase your risk of bone loss. Talk to your doctor about how to keep your bones strong. This medicine should stop regular monthly menstration in women. Tell your doctor if you continue to menstrate. Women should not become pregnant while taking this medicine or for 12 weeks after stopping this medicine. Women should inform their doctor if they wish to become pregnant or think they might be pregnant. There is a potential for serious side effects to an unborn child. Talk to your health care professional or pharmacist for more information. Do not breast-feed an infant while taking this medicine. Men should   inform their doctors if they wish to father a child. This medicine may lower sperm counts. Talk to your health care professional or pharmacist for more information. What side effects may I notice from receiving this medicine? Side effects that you should  report to your doctor or health care professional as soon as possible: -allergic reactions like skin rash, itching or hives, swelling of the face, lips, or tongue -bone pain -breathing problems -changes in vision -chest pain -feeling faint or lightheaded, falls -fever, chills -pain, swelling, warmth in the leg -pain, tingling, numbness in the hands or feet -signs and symptoms of low blood pressure like dizziness; feeling faint or lightheaded, falls; unusually weak or tired -stomach pain -swelling of the ankles, feet, hands -trouble passing urine or change in the amount of urine -unusually high or low blood pressure -unusually weak or tired Side effects that usually do not require medical attention (report to your doctor or health care professional if they continue or are bothersome): -change in sex drive or performance -changes in breast size in both males and females -changes in emotions or moods -headache -hot flashes -irritation at site where injected -loss of appetite -skin problems like acne, dry skin -vaginal dryness This list may not describe all possible side effects. Call your doctor for medical advice about side effects. You may report side effects to FDA at 1-800-FDA-1088. Where should I keep my medicine? This drug is given in a hospital or clinic and will not be stored at home. NOTE: This sheet is a summary. It may not cover all possible information. If you have questions about this medicine, talk to your doctor, pharmacist, or health care provider.    2016, Elsevier/Gold Standard. (2013-07-31 11:10:35)  

## 2018-01-26 ENCOUNTER — Other Ambulatory Visit: Payer: Self-pay

## 2018-01-26 ENCOUNTER — Encounter: Payer: Medicare Other | Attending: Registered Nurse | Admitting: Registered Nurse

## 2018-01-26 ENCOUNTER — Encounter: Payer: Self-pay | Admitting: Registered Nurse

## 2018-01-26 ENCOUNTER — Other Ambulatory Visit: Payer: Self-pay | Admitting: *Deleted

## 2018-01-26 VITALS — BP 105/72 | HR 89 | Ht 64.0 in | Wt 240.0 lb

## 2018-01-26 DIAGNOSIS — M545 Low back pain, unspecified: Secondary | ICD-10-CM

## 2018-01-26 DIAGNOSIS — M79672 Pain in left foot: Secondary | ICD-10-CM

## 2018-01-26 DIAGNOSIS — I890 Lymphedema, not elsewhere classified: Secondary | ICD-10-CM | POA: Diagnosis not present

## 2018-01-26 DIAGNOSIS — Z5181 Encounter for therapeutic drug level monitoring: Secondary | ICD-10-CM

## 2018-01-26 DIAGNOSIS — M542 Cervicalgia: Secondary | ICD-10-CM

## 2018-01-26 DIAGNOSIS — G893 Neoplasm related pain (acute) (chronic): Secondary | ICD-10-CM

## 2018-01-26 DIAGNOSIS — M7061 Trochanteric bursitis, right hip: Secondary | ICD-10-CM

## 2018-01-26 DIAGNOSIS — M7062 Trochanteric bursitis, left hip: Secondary | ICD-10-CM

## 2018-01-26 DIAGNOSIS — M25562 Pain in left knee: Secondary | ICD-10-CM

## 2018-01-26 DIAGNOSIS — M792 Neuralgia and neuritis, unspecified: Secondary | ICD-10-CM

## 2018-01-26 DIAGNOSIS — M546 Pain in thoracic spine: Secondary | ICD-10-CM | POA: Diagnosis not present

## 2018-01-26 DIAGNOSIS — M25561 Pain in right knee: Secondary | ICD-10-CM

## 2018-01-26 DIAGNOSIS — Z79899 Other long term (current) drug therapy: Secondary | ICD-10-CM

## 2018-01-26 DIAGNOSIS — G894 Chronic pain syndrome: Secondary | ICD-10-CM

## 2018-01-26 DIAGNOSIS — Z170 Estrogen receptor positive status [ER+]: Secondary | ICD-10-CM

## 2018-01-26 DIAGNOSIS — G243 Spasmodic torticollis: Secondary | ICD-10-CM

## 2018-01-26 DIAGNOSIS — Z9010 Acquired absence of unspecified breast and nipple: Secondary | ICD-10-CM

## 2018-01-26 DIAGNOSIS — C50811 Malignant neoplasm of overlapping sites of right female breast: Secondary | ICD-10-CM

## 2018-01-26 MED ORDER — OXYCODONE HCL 5 MG PO TABS: 5.0000 mg | ORAL_TABLET | Freq: Four times a day (QID) | ORAL | 0 refills | Status: DC | PRN

## 2018-01-26 NOTE — Progress Notes (Signed)
Subjective:    Patient ID: Karen Hopkins, female    DOB: [birth year], 42 y.o.   MRN: [id number]  HPI: [name] is a 42 year old female who returns for follow up appointment for chronic pain and medication refill. She states her pain is located in her neck radiating into her right shoulder, also reports left shoulder pain, mid-lower back pain, bilateral hips, bilateral knee pain and left foot pain ( ? Plantar  Fascitis) . Also reports generalized pain all over. She rates her pain 9. Her current exercise regime is walking.   Ms. Hands Morphine Equivalent is 30.00 MME. Last UDS was Performed on 12/19/2017, it was consistent.    Pain Inventory Average Pain 8 Pain Right Now 9 My pain is constant, sharp, burning, dull, stabbing, tingling and aching  In the last 24 hours, has pain interfered with the following? General activity 7 Relation with others 8 Enjoyment of life 8 What TIME of day is your pain at its worst? all Sleep (in general) Poor  Pain is worse with: walking, inactivity and standing Pain improves with: medication and injections Relief from Meds: 8  Mobility use a cane how many minutes can you walk? 7 ability to climb steps?  no do you drive?  yes Do you have any goals in this area?  yes  Function Do you have any goals in this area?  no  Neuro/Psych bladder control problems numbness tremor tingling trouble walking spasms dizziness confusion depression anxiety  Prior Studies Any changes since last visit?  no  Physicians involved in your care Any changes since last visit?  no   Family History  Problem Relation Age of Onset  . Hypertension Mother   . Breast cancer Mother 74  . Diabetes type II Father   . Prostate cancer Father   . Stroke Neg Hx    Social History   Socioeconomic History  . Marital status: Single    Spouse name: Not on file  . Number of children: 3  . Years of education: 64  . Highest education level: Not on file    Occupational History  . Occupation: Disability   Social Needs  . Financial resource strain: Not on file  . Food insecurity:    Worry: Not on file    Inability: Not on file  . Transportation needs:    Medical: Not on file    Non-medical: Not on file  Tobacco Use  . Smoking status: Former Smoker    Packs/day: 0.25    Years: 5.00    Pack years: 1.25    Types: Cigarettes    Start date: 06/07/2002    Last attempt to quit: 04/26/2010    Years since quitting: 7.7  . Smokeless tobacco: Never Used  Substance and Sexual Activity  . Alcohol use: No  . Drug use: No  . Sexual activity: Not Currently    Birth control/protection: Surgical  Lifestyle  . Physical activity:    Days per week: Not on file    Minutes per session: Not on file  . Stress: Not on file  Relationships  . Social connections:    Talks on phone: Not on file    Gets together: Not on file    Attends religious service: Not on file    Active member of club or organization: Not on file    Attends meetings of clubs or organizations: Not on file    Relationship status: Not on file  Other Topics  Concern  . Not on file  Social History Narrative   Lives with kids   Caffeine use: none    Past Surgical History:  Procedure Laterality Date  . BREAST BIOPSY Right 08/26/2009  . BREAST REDUCTION SURGERY Left 08/23/2013   Procedure: LEFT BREAST REDUCTION  ;  Surgeon: Theodoro Kos, DO;  Location: [place];  Service: Plastics;  Laterality: Left;  . CESAREAN SECTION  07/15/2001; 03/18/2004; 11/21/2008  . lateral orbiotomy Left 11/2015   [facility] Toy Co[facility]key at [facility]  . LATISSIMUS FLAP TO BREAST Right 03/12/2013   Procedure: RIGHT BREAST LATISSIMUS FLAP WITH EXPANDER PLACEMENT;  Surgeon: Theodoro Kos, DO;  Location: [name];  Service: Plastics;  Laterality: Right;  . LIPOSUCTION Bilateral 08/23/2013   Procedure: LIPOSUCTION;  Surgeon: Theodoro Kos, DO;  Location: [name];  Service: Plastics;  Laterality:  Bilateral;  . MASTECTOMY Right 2011  . MODIFIED RADICAL MASTECTOMY Right 03/11/2010  . NASAL SEPTUM SURGERY    . PORT-A-CATH REMOVAL Left 12/01/2010  . PORTACATH PLACEMENT Left 09/12/2009  . REDUCTION MAMMAPLASTY Left 08/2013  . REMOVAL OF TISSUE EXPANDER AND PLACEMENT OF IMPLANT Right 08/23/2013   Procedure: REMOVAL RIGHT TISSUE EXPANDER AND PLACEMENT OF IMPLANT TO RIGHT BREAST ;  Surgeon: Theodoro Kos, DO;  Location: [place];  Service: Plastics;  Laterality: Right;  . SUPRA-UMBILICAL HERNIA  0623  . TUBAL LIGATION  11/21/2008   Past Medical History:  Diagnosis Date  . Anxiety   . Cancer ([place]) 2011   Rt. Br. Ca  . Depression   . Essential hypertension 11/05/2015  . History of breast cancer 2011   right  . History of chemotherapy 2011  . History of radiation therapy 2011  . Hypertension    under control with med., has been on med. x 2 yr.  . Lymphedema of arm    right; no BP or puncture to right arm  . Personal history of chemotherapy 09/16/2009   rt breast  . Personal history of radiation therapy 05/2010   rt breast  . Pneumonia   . Seasonal allergies   . Sinus headache    BP 105/72   Pulse 89   Ht 5\' 4"  (1.626 m)   Wt 240 lb (108.9 kg)   LMP 07/22/2014 (Approximate)   SpO2 97%   BMI 41.20 kg/m   Opioid Risk Score:   Fall Risk Score:  `1  Depression screen PHQ 2/9  Depression screen [facility] 2/9 01/26/2018 12/19/2017 08/23/2017 08/31/2016 07/22/2016 07/20/2016 07/08/2016  Decreased Interest 3 3 1  0 0 0 0  Down, Depressed, Hopeless 3 3 1  0 0 0 0  PHQ - 2 Score 6 6 2  0 0 0 0  Some recent data might be hidden   Review of Systems  Constitutional: Negative.   HENT: Negative.   Eyes: Negative.   Respiratory: Negative.   Cardiovascular: Negative.   Gastrointestinal: Negative.   Endocrine: Negative.   Genitourinary: Negative.   Musculoskeletal: Negative.   Skin: Negative.   Allergic/Immunologic: Negative.   Neurological: Negative.   Hematological: Negative.    Psychiatric/Behavioral: Negative.   All other systems reviewed and are negative.      Objective:   Physical Exam  Constitutional: She is oriented to [place], place, and time. She appears well-developed and well-nourished.  HENT:  Head: Normocephalic and atraumatic.  Neck: Normal range of motion. Neck supple.  Cervical Paraspinal Tenderness: C-5-C-6  Cardiovascular: Normal rate and regular rhythm.  Pulmonary/Chest: Effort normal and breath sounds normal.  Musculoskeletal:  Normal Muscle Bulk and Muscle Testing Reveals: Upper Extremities: Full ROM and Muscle Strength 5/5 Bilateral AC Joint Tenderness Thoracic and Lumbar Hypersensitivity Lower Extremities: Full ROM and Muscle Strength 5/5 Arises from Table with ease Narrow Based Gait  Neurological: She is alert and oriented to [place], place, and time.  Skin: Skin is warm and dry.  Psychiatric: She has a normal mood and affect.  Nursing note and vitals reviewed.         Assessment & Plan:  1.Myofascial pain syndrome chronic postoperative, as well as post radiation: S/P Breast Reconstruction Surgery x 2. Continueto Monitor.01/26/2018. Continue current medication regime: Refilled: Oxycodone 5 mg one tablet every 6 hours as needed for severe pain #120.Continue HEP. We will continue the opioid monitoring program, this consists of regular clinic visits, examinations, urine drug screen, pill counts as well as use of [place] Controlled Substance Reporting System. 2. Neoplasm Related Pain: Continue Current Medication Regime. 01/26/2018 3. Lymphedema of Right Upper Extremity: Oncology Following. 01/26/2018 4.Cervicalgia/Cervical Dystonia: S/P Dysport Injection. 01/26/2018 5. Obesity: Continue with Healthy Diet and Exercise Regime. 01/26/2018 6. Muscle Spasms: Continuecurrent medication regimeTizanidine. 01/26/2018 7. Depression: Continue Cymbalta and Counseling at [place]. 01/26/2018 8.Chronic Bilateral Knee Pai/  OA: Continue VoltarenGel. 01/26/2018 9.Chronic Midline/ Low Back Bain/ Bilateral Low Back Pain: Continue with HEP as Tolerated. Continue current medication regimen.01/26/2018 10. Neuropathic Pain:ContinueGabapentin:01/26/2018 11. Opioid Induced Constipation: Movantik not on her Drug Formulary: Continue: Amitiza. 01/26/2018. 12. Bilateral Greater Trochanter Bursitis: Continue HEP and Ice Therapy as needed.  13. Bilateral Knee Pain: Continue HEP as Tolerated. Continue to Monitor.   20 minutes of face to face patient care time was spent during this visit. All questions were encouraged and answered.   F/U in 1 month

## 2018-01-27 ENCOUNTER — Telehealth: Payer: Self-pay | Admitting: Physical Therapy

## 2018-01-27 ENCOUNTER — Encounter: Payer: Self-pay | Admitting: Physical Therapy

## 2018-01-27 ENCOUNTER — Ambulatory Visit: Payer: Medicare Other | Admitting: Physical Therapy

## 2018-01-27 DIAGNOSIS — R208 Other disturbances of skin sensation: Secondary | ICD-10-CM | POA: Diagnosis not present

## 2018-01-27 DIAGNOSIS — R296 Repeated falls: Secondary | ICD-10-CM | POA: Diagnosis not present

## 2018-01-27 DIAGNOSIS — R2681 Unsteadiness on feet: Secondary | ICD-10-CM | POA: Diagnosis not present

## 2018-01-27 DIAGNOSIS — R262 Difficulty in walking, not elsewhere classified: Secondary | ICD-10-CM

## 2018-01-27 DIAGNOSIS — R269 Unspecified abnormalities of gait and mobility: Secondary | ICD-10-CM

## 2018-01-27 NOTE — Telephone Encounter (Signed)
[place], I have been working with [employer] on physical therapy and feel she would benefit from a single point cane to decrease her falls risk when ambulating.  If you agree would you please enter a DME order in Epic for a single point cane?  Thanks, Rico Junker, PT, DPT 01/27/18    1:50 PM

## 2018-01-27 NOTE — Telephone Encounter (Signed)
Single point cane ordered, per PT request.

## 2018-01-27 NOTE — Therapy (Signed)
[name] [address] [place], [place], [address] Phone: [phone]   Fax:  [phone]  Physical Therapy Treatment  Patient Details  Name: Karen Hopkins MRN: [id number] Date of Birth: 1975-10-28 Referring Provider: Bayard Hugger, NP   Encounter Date: 01/27/2018  PT End of Session - 01/27/18 1501    Visit Number  2    Number of Visits  17    Date for PT Re-Evaluation  03/18/18    Authorization Type  [employer] Medicare and Medicaid - pt has no financial responsibility    PT Start Time  1316    PT Stop Time  1400    PT Time Calculation (min)  44 min    Activity Tolerance  Patient limited by pain   L plantar fascia pain   Behavior During Therapy  [facility] for tasks assessed/performed       Past Medical History:  Diagnosis Date  . Anxiety   . Cancer ([place]) 2011   Rt. Br. Ca  . Depression   . Essential hypertension 11/05/2015  . History of breast cancer 2011   right  . History of chemotherapy 2011  . History of radiation therapy 2011  . Hypertension    under control with med., has been on med. x 2 yr.  . Lymphedema of arm    right; no BP or puncture to right arm  . Personal history of chemotherapy 09/16/2009   rt breast  . Personal history of radiation therapy 05/2010   rt breast  . Pneumonia   . Seasonal allergies   . Sinus headache     Past Surgical History:  Procedure Laterality Date  . BREAST BIOPSY Right 08/26/2009  . BREAST REDUCTION SURGERY Left 08/23/2013   Procedure: LEFT BREAST REDUCTION  ;  Surgeon: Theodoro Kos, DO;  Location: [place];  Service: Plastics;  Laterality: Left;  . CESAREAN SECTION  07/15/2001; 03/18/2004; 11/21/2008  . lateral orbiotomy Left 11/2015   Dr. Toy Cookey at [facility]  . LATISSIMUS FLAP TO BREAST Right 03/12/2013   Procedure: RIGHT BREAST LATISSIMUS FLAP WITH EXPANDER PLACEMENT;  Surgeon: Theodoro Kos, DO;  Location: [place];  Service: Plastics;  Laterality: Right;  .  LIPOSUCTION Bilateral 08/23/2013   Procedure: LIPOSUCTION;  Surgeon: Theodoro Kos, DO;  Location: [place];  Service: Plastics;  Laterality: Bilateral;  . MASTECTOMY Right 2011  . MODIFIED RADICAL MASTECTOMY Right 03/11/2010  . NASAL SEPTUM SURGERY    . PORT-A-CATH REMOVAL Left 12/01/2010  . PORTACATH PLACEMENT Left 09/12/2009  . REDUCTION MAMMAPLASTY Left 08/2013  . REMOVAL OF TISSUE EXPANDER AND PLACEMENT OF IMPLANT Right 08/23/2013   Procedure: REMOVAL RIGHT TISSUE EXPANDER AND PLACEMENT OF IMPLANT TO RIGHT BREAST ;  Surgeon: Theodoro Kos, DO;  Location: [place];  Service: Plastics;  Laterality: Right;  . SUPRA-UMBILICAL HERNIA  2620  . TUBAL LIGATION  11/21/2008    There were no vitals filed for this visit.  Subjective Assessment - 01/27/18 1326    Subjective  Neck has felt a lot better but was in a lot of pain yesterday during the weather.  Pt experiencing increased pain in plantar region of L foot; has been wearing slides but changed to tennis shoes over the past few days.    Pertinent History  RUE lympedema - RUE restricted for BP readings.   anxiety and depression, chronic use of opioids, neuropathic pain, bilat knee OA/pain, low back pain, cervical dystonia treated with injection, essential  HTN, R breast cancer with R radical mastectomy, chemo, radiation and lymphedema.    Limitations  Walking    Diagnostic tests  x-ray - OA bilat knees and hips    Patient Stated Goals  To get a cane, to walk straight and have decreased falls    Currently in Pain?  Yes    Pain Location  Foot    Pain Orientation  Left    Pain Descriptors / Indicators  Sore    Pain Type  Acute pain    Pain Onset  Other (comment)   years ago after chemo                      [facility] Adult PT Treatment/Exercise - 01/27/18 1351      Ambulation/Gait   Ambulation/Gait  Yes    Ambulation/Gait Assistance  5: Supervision    Ambulation/Gait Assistance Details  indoor and  outdoor gait training with single point cane with verbal cues for stepping sequence    Ambulation Distance (Feet)  250 Feet    Assistive device  Straight cane    Gait Pattern  Step-to pattern;Step-through pattern;Decreased step length - right;Decreased step length - left;Decreased stance time - left;Decreased stride length;Antalgic;Lateral trunk lean to right    Ambulation Surface  Level;Unlevel;Indoor;Outdoor;Paved    Stairs  Yes    Stairs Assistance  4: Min assist    Stairs Assistance Details (indicate cue type and reason)  verbal cues for sequencing with cane    Stair Management Technique  One rail Right;One rail Left;Step to pattern;Forwards;With cane    Number of Stairs  4    Height of Stairs  6    Ramp  4: Min assist    Ramp Details (indicate cue type and reason)  with cane; verbal cues for sequence    Curb  4: Min assist    Curb Details (indicate cue type and reason)  with cane and verbal cues for sequence      Exercises   Exercises  Ankle      Ankle Exercises: Stretches   Plantar Fascia Stretch  3 reps;20 seconds    Plantar Fascia Stretch Limitations  with belt in sitting             PT Education - 01/27/18 1500    Education Details  gait training with cane, plantar fascia stretch    Musco(s) Educated  Patient    Methods  Explanation;Demonstration;Verbal cues    Comprehension  Need further instruction       PT Short Term Goals - 01/17/18 1309      PT SHORT TERM GOAL #1   Title  Pt will participate in further assessment of balance with BERG    Time  4    Period  Weeks    Status  New    Target Date  02/16/18      PT SHORT TERM GOAL #2   Title  Pt will improve LE strength as indicated by decrease in five time sit to stand to </= 15 seconds from mat without use of UE    Baseline  19 seconds from mat    Time  4    Period  Weeks    Status  New    Target Date  02/16/18      PT SHORT TERM GOAL #3   Title  Pt will improve gait velocity to >/= 2.6 ft/sec with  LRAD to decrease risk for falls in home/community  Baseline  2.45    Time  4    Period  Weeks    Status  New    Target Date  02/16/18      PT SHORT TERM GOAL #4   Title  Pt will ambulate with LRAD x 200' over outdoor surfaces with supervision to improve safety when ambulating at son's football games    Time  4    Period  Weeks    Status  New    Target Date  02/16/18      PT SHORT TERM GOAL #5   Title  Pt will report 25% reduction in # of falls/near falls     Time  4    Period  Weeks    Status  New        PT Long Term Goals - 01/17/18 1314      PT LONG TERM GOAL #1   Title  Pt will demonstrate independence with balance and strength HEP    Time  8    Period  Weeks    Status  New    Target Date  03/18/18      PT LONG TERM GOAL #2   Title  Pt will report 70% reduction in falls at home (when performing ADL/chores) and in the community    Time  8    Period  Weeks    Status  New    Target Date  03/18/18      PT LONG TERM GOAL #3   Title  Pt will improve five time sit to stand to </= 13 seconds without use of UE    Time  8    Period  Weeks    Status  New    Target Date  03/18/18      PT LONG TERM GOAL #4   Title  Will increase gait velocity to >/= 3.0 ft/sec with LRAD    Time  8    Period  Weeks    Status  New    Target Date  03/18/18      PT LONG TERM GOAL #5   Title  Pt will increase BERG score by 8 points    Baseline  TBD    Time  8    Period  Weeks    Status  New    Target Date  03/18/18      Additional Long Term Goals   Additional Long Term Goals  Yes      PT LONG TERM GOAL #6   Title  Pt will ambulate x 500' over outdoor surface with LRAD and MOD I to improve independence with ambulation to son's football games    Time  8    Period  Weeks    Status  New    Target Date  03/18/18            Plan - 01/27/18 1502    Clinical Impression Statement  Pt unable to participate in balance testing today due to significant pain in L plantar fascia but  pt able to tolerate gait assessment and gait training with cane in RUE over indoor, outdoor surfaces, ramp, curb and stairs with max verbal and visual cues for sequencing and step through gait.  Also incorporated one sitting plantar fascia stretch and recommended pt continue to wear supportive tennis shoes, instead of slide ons and to ice the bottom of her foot through the weekend.  Request for prescription for cane sent to NP.  Will continue  to address in order to progress towards LTG.    Rehab Potential  Good    PT Frequency  2x / week    PT Duration  8 weeks    PT Treatment/Interventions  ADLs/Self Care Home Management;Aquatic Therapy;Electrical Stimulation;DME Instruction;Gait training;Functional mobility training;Therapeutic activities;Therapeutic exercise;Balance training;Neuromuscular re-education;Patient/family education;Orthotic Fit/Training    PT Next Visit Plan  assess BERG - reset goal baseline; did order for cane come in.  How is foot feeling? need to add plantar fascia stretches.  Initiate HEP    PT Home Exercise Plan  balance and balance reactions, LE strengthening    Consulted and Agree with Plan of Care  Patient       Patient will benefit from skilled therapeutic intervention in order to improve the following deficits and impairments:  Decreased balance, Decreased strength, Difficulty walking, Impaired sensation  Visit Diagnosis: Unsteadiness on feet  Repeated falls  Difficulty in walking, not elsewhere classified  Other disturbances of skin sensation     Problem List Patient Active Problem List   Diagnosis Date Noted  . Dyspnea 09/23/2016  . Eczema 08/31/2016  . Essential hypertension 11/05/2015  . Cervical dystonia 08/25/2015  . Headache 07/09/2015  . Myofascial muscle pain 04/29/2015  . Adhesive capsulitis of right shoulder 04/29/2015  . Type 2 diabetes mellitus without complication ([place]) 09/62/8366  . Lower extremity edema 10/01/2014  . Malignant neoplasm of  overlapping sites of right breast in female, estrogen receptor positive ([place]) 05/24/2014  . Myalgia and myositis 05/17/2014  . Neoplasm related pain 03/12/2014  . CN (constipation) 11/26/2013  . H/O reduction mammoplasty 10/30/2013  . Seizure ([place]) 10/06/2013  . Status post bilateral breast implants 08/31/2013  . Acquired absence of breast and absent nipple 08/23/2013  . S/P breast reconstruction, right 08/23/2013  . AC joint arthropathy 07/02/2013  . Routine adult health maintenance 05/29/2013  . Hypokalemia 05/21/2013  . Chronic pain 04/19/2013  . Elevated blood sugar 03/09/2013  . Acute bronchitis 02/22/2013  . Lymphedema 05/24/2011  . RHINOSINUSITIS, CHRONIC 04/08/2010  . Obesity 08/04/2006  . DEPRESSIVE DISORDER, NOS 08/04/2006  . Allergic rhinitis 08/04/2006    Rico Junker, PT, DPT 01/27/18    3:09 PM    [facility] [address] [place], [place], [address] Phone: [phone]   Fax:  [phone]  Name: Latisha Lasch Gillies MRN: [id number] Date of Birth: 1976-03-12

## 2018-01-30 ENCOUNTER — Other Ambulatory Visit: Payer: Self-pay | Admitting: Family Medicine

## 2018-01-30 ENCOUNTER — Other Ambulatory Visit: Payer: Self-pay | Admitting: Registered Nurse

## 2018-01-30 ENCOUNTER — Other Ambulatory Visit: Payer: Self-pay | Admitting: Cardiovascular Disease

## 2018-01-30 ENCOUNTER — Ambulatory Visit: Payer: Medicare Other | Admitting: Physical Therapy

## 2018-01-31 ENCOUNTER — Encounter

## 2018-01-31 ENCOUNTER — Ambulatory Visit: Payer: Medicare Other

## 2018-02-01 ENCOUNTER — Encounter: Payer: Self-pay | Admitting: Physical Therapy

## 2018-02-01 ENCOUNTER — Ambulatory Visit: Payer: Medicare Other

## 2018-02-01 ENCOUNTER — Ambulatory Visit: Payer: Medicare Other | Admitting: Physical Therapy

## 2018-02-01 DIAGNOSIS — R208 Other disturbances of skin sensation: Secondary | ICD-10-CM | POA: Diagnosis not present

## 2018-02-01 DIAGNOSIS — R2681 Unsteadiness on feet: Secondary | ICD-10-CM

## 2018-02-01 DIAGNOSIS — R296 Repeated falls: Secondary | ICD-10-CM

## 2018-02-01 DIAGNOSIS — R262 Difficulty in walking, not elsewhere classified: Secondary | ICD-10-CM

## 2018-02-01 NOTE — Therapy (Signed)
[facility] [address] [place], [place], [address] Phone: [phone]   Fax:  [phone]  Physical Therapy Treatment  Patient Details  Name: Karen Hopkins MRN: [id number] Date of Birth: 10/30/1975 Referring Provider: Bayard Hugger, NP   Encounter Date: 02/01/2018  PT End of Session - 02/01/18 1936    Visit Number  3    Number of Visits  17    Date for PT Re-Evaluation  03/18/18    Authorization Type  [employer] Medicare and Medicaid - pt has no financial responsibility    PT Start Time  1027   arrived late   PT Stop Time  1106    PT Time Calculation (min)  39 min    Activity Tolerance  Patient tolerated treatment well    Behavior During Therapy  [facility] for tasks assessed/performed       Past Medical History:  Diagnosis Date  . Anxiety   . Cancer ([place]) 2011   Rt. Br. Ca  . Depression   . Essential hypertension 11/05/2015  . History of breast cancer 2011   right  . History of chemotherapy 2011  . History of radiation therapy 2011  . Hypertension    under control with med., has been on med. x 2 yr.  . Lymphedema of arm    right; no BP or puncture to right arm  . Personal history of chemotherapy 09/16/2009   rt breast  . Personal history of radiation therapy 05/2010   rt breast  . Pneumonia   . Seasonal allergies   . Sinus headache     Past Surgical History:  Procedure Laterality Date  . BREAST BIOPSY Right 08/26/2009  . BREAST REDUCTION SURGERY Left 08/23/2013   Procedure: LEFT BREAST REDUCTION  ;  Surgeon: Theodoro Kos, DO;  Location: [facility];  Service: Plastics;  Laterality: Left;  . CESAREAN SECTION  07/15/2001; 03/18/2004; 11/21/2008  . lateral orbiotomy Left 11/2015   Dr. Toy Cookey at [facility]  . LATISSIMUS FLAP TO BREAST Right 03/12/2013   Procedure: RIGHT BREAST LATISSIMUS FLAP WITH EXPANDER PLACEMENT;  Surgeon: Theodoro Kos, DO;  Location: [facility];  Service: Plastics;  Laterality: Right;  .  LIPOSUCTION Bilateral 08/23/2013   Procedure: LIPOSUCTION;  Surgeon: Theodoro Kos, DO;  Location: [facility];  Service: Plastics;  Laterality: Bilateral;  . MASTECTOMY Right 2011  . MODIFIED RADICAL MASTECTOMY Right 03/11/2010  . NASAL SEPTUM SURGERY    . PORT-A-CATH REMOVAL Left 12/01/2010  . PORTACATH PLACEMENT Left 09/12/2009  . REDUCTION MAMMAPLASTY Left 08/2013  . REMOVAL OF TISSUE EXPANDER AND PLACEMENT OF IMPLANT Right 08/23/2013   Procedure: REMOVAL RIGHT TISSUE EXPANDER AND PLACEMENT OF IMPLANT TO RIGHT BREAST ;  Surgeon: Theodoro Kos, DO;  Location: [facility];  Service: Plastics;  Laterality: Right;  . SUPRA-UMBILICAL HERNIA  9528  . TUBAL LIGATION  11/21/2008    There were no vitals filed for this visit.  Subjective Assessment - 02/01/18 1032    Subjective  L foot is feeling better but it was hurting her a lot on Monday after being on her feet all day Saturday in the rain at her son's football game.  Pt very fatigued today.  Provided pt with prescription for [facility].    Pertinent History  RUE lympedema - RUE restricted for BP readings.   anxiety and depression, chronic use of opioids, neuropathic pain, bilat knee OA/pain, low back pain, cervical dystonia treated with injection, essential HTN, R  breast cancer with R radical mastectomy, chemo, radiation and lymphedema.    Limitations  Walking    Diagnostic tests  x-ray - OA bilat knees and hips    Patient Stated Goals  To get a cane, to walk straight and have decreased falls    Currently in Pain?  Yes    Pain Score  5     Pain Location  Foot    Pain Orientation  Left    Pain Descriptors / Indicators  Sore    Pain Type  Acute pain    Pain Onset  Other (comment)   years ago after chemo        [facility] PT Assessment - 02/01/18 1037      Standardized Balance Assessment   Standardized Balance Assessment  Berg Balance Test      Berg Balance Test   Sit to Stand  Able to stand without using hands and  stabilize independently    Standing Unsupported  Able to stand safely 2 minutes    Sitting with Back Unsupported but Feet Supported on Floor or Stool  Able to sit safely and securely 2 minutes    Stand to Sit  Sits safely with minimal use of hands    Transfers  Able to transfer safely, minor use of hands    Standing Unsupported with Eyes Closed  Able to stand 10 seconds with supervision    Standing Ubsupported with Feet Together  Able to place feet together independently and stand for 1 minute with supervision    From Standing, Reach Forward with Outstretched Arm  Can reach confidently >25 cm (10")    From Standing Position, Pick up Object from Floor  Able to pick up shoe, needs supervision    From Standing Position, Turn to Look Behind Over each Shoulder  Turn sideways only but maintains balance    Turn 360 Degrees  Able to turn 360 degrees safely but slowly    Standing Unsupported, Alternately Place Feet on Step/Stool  Able to stand independently and complete 8 steps >20 seconds    Standing Unsupported, One Foot in Front  Able to take small step independently and hold 30 seconds    Standing on One Leg  Able to lift leg independently and hold 5-10 seconds    Total Score  45    Berg comment:  45/56        Patient demonstrates increased fall risk as noted by score of 45/56 on Berg Balance Scale.  (<36= high risk for falls, close to 100%; 37-45 significant >80%; 46-51 moderate >50%; 52-55 lower >25%)             [facility] Adult PT Treatment/Exercise - 02/01/18 1937      Ambulation/Gait   Ambulation/Gait  Yes    Ambulation/Gait Assistance  4: Min assist    Ambulation/Gait Assistance Details  continued gait training with [place] as pt will be obtaining a new cane from [facility] today. Pt continued to verbalize and demonstrate difficulty with sequencing step pattern with cane and required hand over hand assistance for sequencing on straight path.     Ambulation Distance (Feet)  100 Feet     Assistive device  Straight cane    Ambulation Surface  Level;Indoor             PT Education - 02/01/18 1935    Education Details  BERG results and falls risk.  Continued gait training with cane    [place]o(s) Educated  Patient  Methods  Explanation    Comprehension  Need further instruction;Verbalized understanding       PT Short Term Goals - 02/01/18 1941      PT SHORT TERM GOAL #1   Title  Pt will participate in further assessment of balance with BERG    Baseline  45/56    Time  4    Period  Weeks    Status  Achieved      PT SHORT TERM GOAL #2   Title  Pt will improve LE strength as indicated by decrease in five time sit to stand to </= 15 seconds from mat without use of UE    Baseline  19 seconds from mat    Time  4    Period  Weeks    Status  New    Target Date  02/16/18      PT SHORT TERM GOAL #3   Title  Pt will improve gait velocity to >/= 2.6 ft/sec with LRAD to decrease risk for falls in home/community    Baseline  2.45    Time  4    Period  Weeks    Status  New    Target Date  02/16/18      PT SHORT TERM GOAL #4   Title  Pt will ambulate with LRAD x 200' over outdoor surfaces with supervision to improve safety when ambulating at son's football games    Time  4    Period  Weeks    Status  New    Target Date  02/16/18      PT SHORT TERM GOAL #5   Title  Pt will report 25% reduction in # of falls/near falls     Time  4    Period  Weeks    Status  New    Target Date  02/16/18        PT Long Term Goals - 02/01/18 1942      PT LONG TERM GOAL #1   Title  Pt will demonstrate independence with balance and strength HEP    Time  8    Period  Weeks    Status  New    Target Date  03/18/18      PT LONG TERM GOAL #2   Title  Pt will report 70% reduction in falls at home (when performing ADL/chores) and in the community    Time  8    Period  Weeks    Status  New    Target Date  03/18/18      PT LONG TERM GOAL #3   Title  Pt will improve five  time sit to stand to </= 13 seconds without use of UE    Time  8    Period  Weeks    Status  New    Target Date  03/18/18      PT LONG TERM GOAL #4   Title  Will increase gait velocity to >/= 3.0 ft/sec with LRAD    Time  8    Period  Weeks    Status  New    Target Date  03/18/18      PT LONG TERM GOAL #5   Title  Pt will increase BERG score by 8 points    Baseline  45/56    Time  8    Period  Weeks    Status  Revised    Target Date  03/18/18      PT  LONG TERM GOAL #6   Title  Pt will ambulate x 500' over outdoor surface with LRAD and MOD I to improve independence with ambulation to son's football games    Time  8    Period  Weeks    Status  New    Target Date  03/18/18            Plan - 02/01/18 1938    Clinical Impression Statement  Due to improvement in foot pain pt able to tolerate further balance assessment with BERG.  Pt demonstrates significant falls risk with recommendation for use of cane indoors; pt provided with prescription for [facility].  Therapy continued to instruct pt in sequencing of gait with SPC.  Will continue to address and begin to add standing balance exercises to HEP next session.    Rehab Potential  Good    PT Frequency  2x / week    PT Duration  8 weeks    PT Treatment/Interventions  ADLs/Self Care Home Management;Aquatic Therapy;Electrical Stimulation;DME Instruction;Gait training;Functional mobility training;Therapeutic activities;Therapeutic exercise;Balance training;Neuromuscular re-education;Patient/family education;Orthotic Fit/Training    PT Next Visit Plan  Initiate HEP based on BERG - narrow BOS, SLS, taps, turning/rotation, compliant surfaces.  Gait training with cane on variety of surfaces, stairs!    PT Home Exercise Plan  balance and balance reactions, LE strengthening    Consulted and Agree with Plan of Care  Patient       Patient will benefit from skilled therapeutic intervention in order to improve the following deficits and  impairments:  Decreased balance, Decreased strength, Difficulty walking, Impaired sensation  Visit Diagnosis: Repeated falls  Unsteadiness on feet  Difficulty in walking, not elsewhere classified  Other disturbances of skin sensation     Problem List Patient Active Problem List   Diagnosis Date Noted  . Dyspnea 09/23/2016  . Eczema 08/31/2016  . Essential hypertension 11/05/2015  . Cervical dystonia 08/25/2015  . Headache 07/09/2015  . Myofascial muscle pain 04/29/2015  . Adhesive capsulitis of right shoulder 04/29/2015  . Type 2 diabetes mellitus without complication ([place]) 18/29/9371  . Lower extremity edema 10/01/2014  . Malignant neoplasm of overlapping sites of right breast in female, estrogen receptor positive ([place]) 05/24/2014  . Myalgia and myositis 05/17/2014  . Neoplasm related pain 03/12/2014  . CN (constipation) 11/26/2013  . H/O reduction mammoplasty 10/30/2013  . Seizure ([place]) 10/06/2013  . Status post bilateral breast implants 08/31/2013  . Acquired absence of breast and absent nipple 08/23/2013  . S/P breast reconstruction, right 08/23/2013  . AC joint arthropathy 07/02/2013  . Routine adult health maintenance 05/29/2013  . Hypokalemia 05/21/2013  . Chronic pain 04/19/2013  . Elevated blood sugar 03/09/2013  . Acute bronchitis 02/22/2013  . Lymphedema 05/24/2011  . RHINOSINUSITIS, CHRONIC 04/08/2010  . Obesity 08/04/2006  . DEPRESSIVE DISORDER, NOS 08/04/2006  . Allergic rhinitis 08/04/2006    Rico Junker, PT, DPT 02/01/18    7:44 PM    [facility] [address] [place], [place], [address] Phone: [phone]   Fax:  [phone]  Name: Karen Hopkins MRN: [id number] Date of Birth: 08-15-1975

## 2018-02-05 IMAGING — CT CT CHEST W/ CM
2 of 3 series · 15 of 36 positions shown, 18 images · IV contrast (isovue)
Comparison: 09/16/2016

CLINICAL DATA: Breast pain and shortness of breath for 3 months.
History of breast cancer.

EXAM:
CT CHEST WITH CONTRAST
TECHNIQUE: Multidetector CT imaging of the chest was performed during
intravenous contrast administration.
CONTRAST:  75 cc Isovue 300

[Series 2: axial st · axial · 0.70mm/px · z∈[-372,-156]mm · 12 of 128 slices shown, 15 images]
[im 10/128  mediastinal]
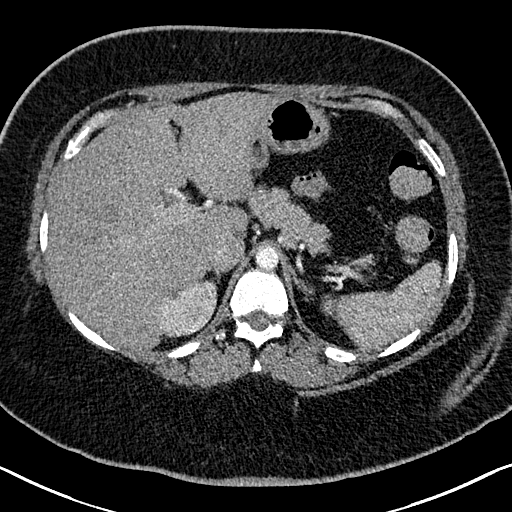
[im 10/128  lung]
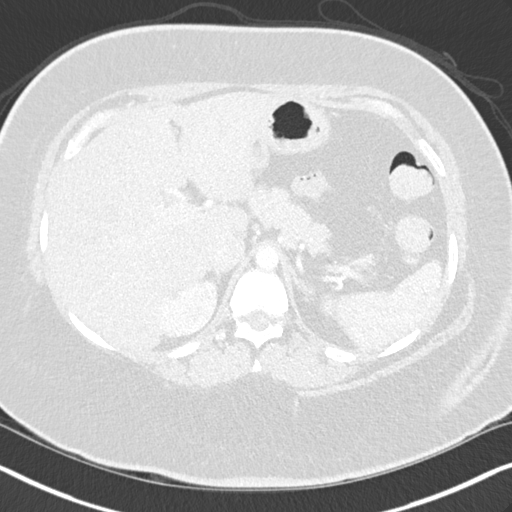
[im 19/128  lung]
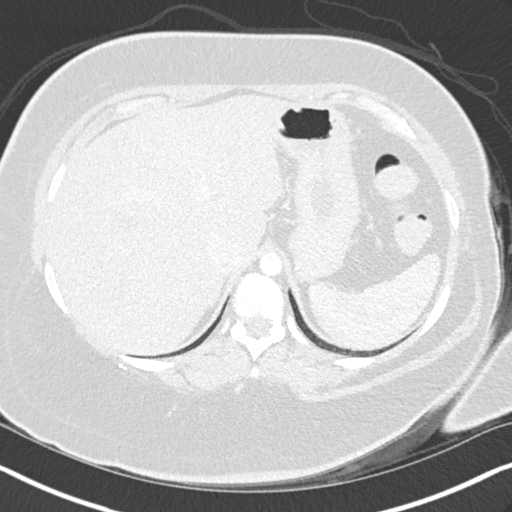
[im 29/128  lung]
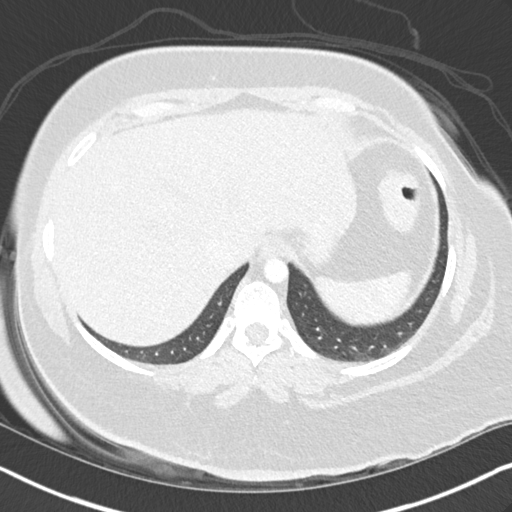
[im 38/128  lung]
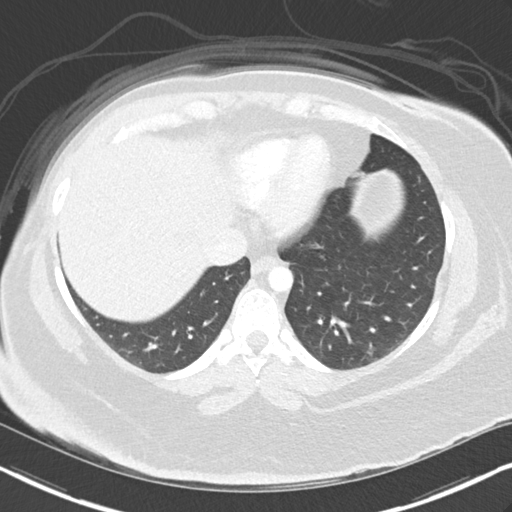
[im 48/128  mediastinal]
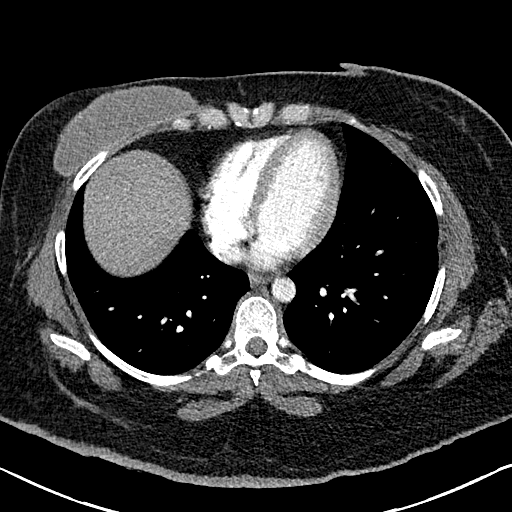
[im 48/128  lung]
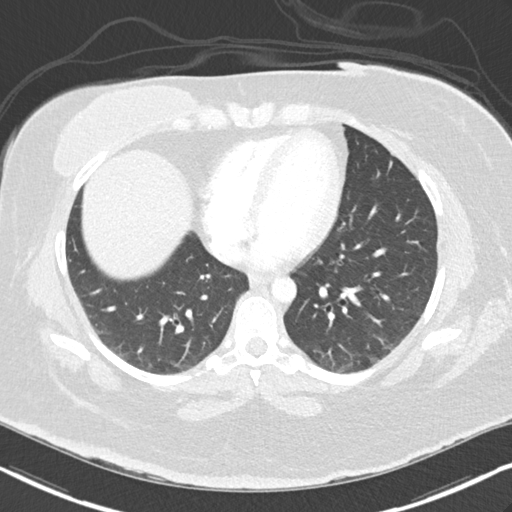
[im 57/128  lung]
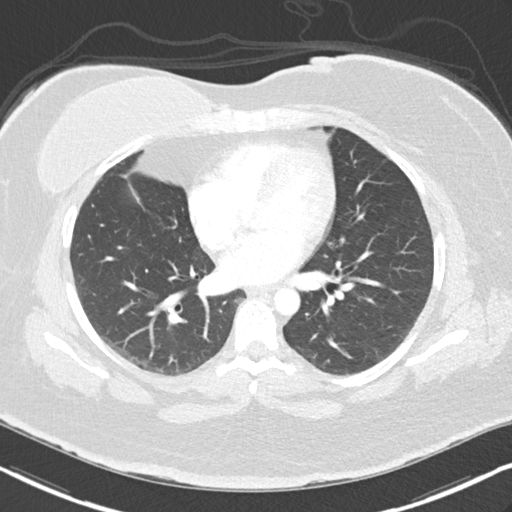
[im 71/128  lung]
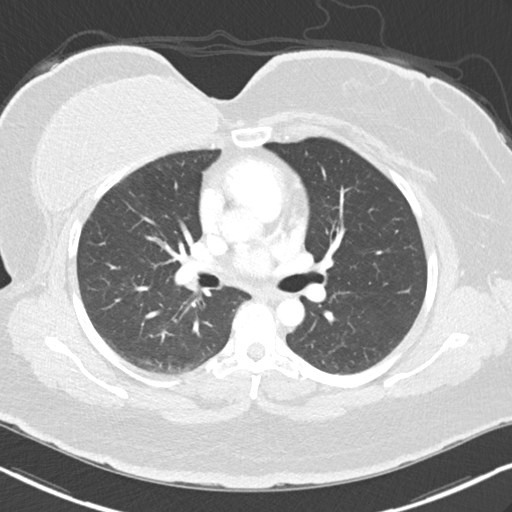
[im 80/128  lung]
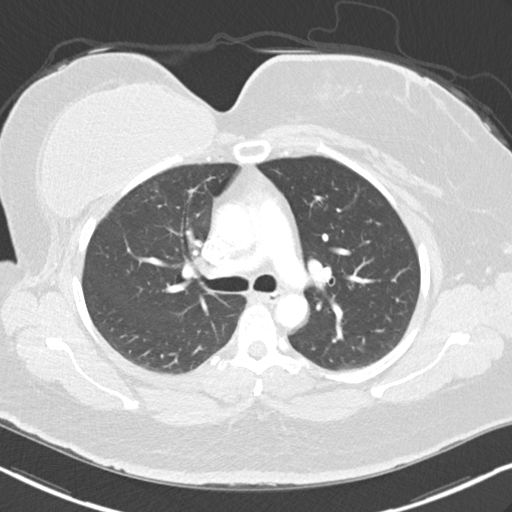
[im 90/128  mediastinal]
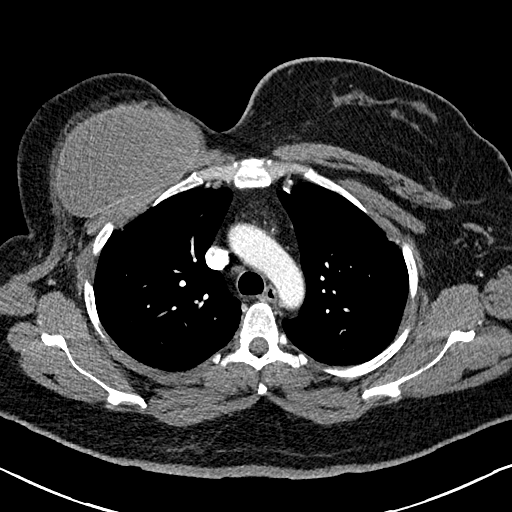
[im 90/128  lung]
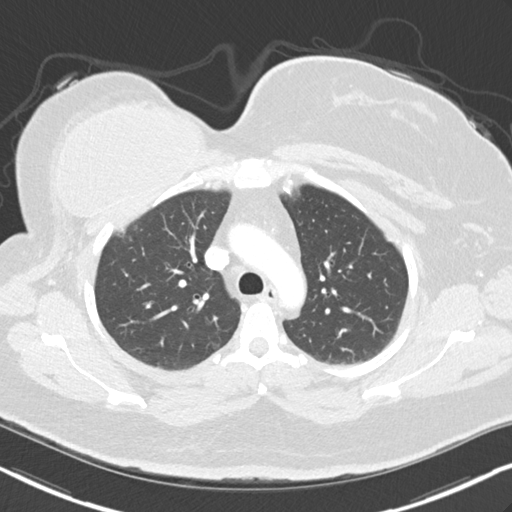
[im 99/128  lung]
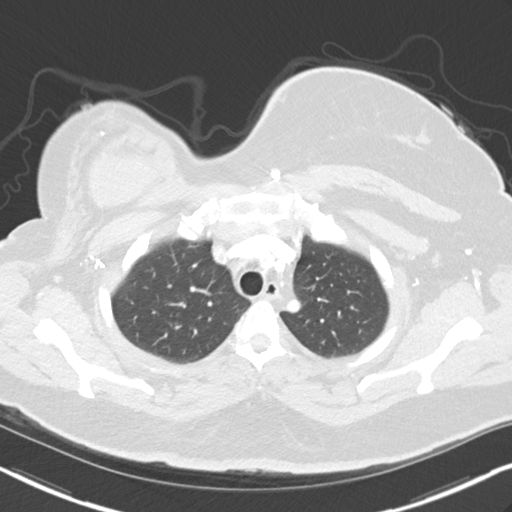
[im 109/128  lung]
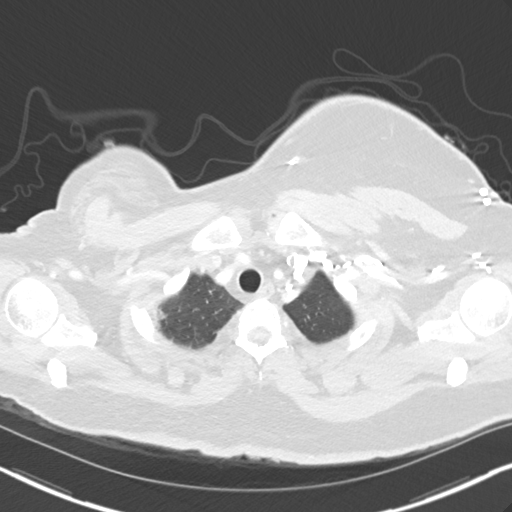
[im 118/128  lung]
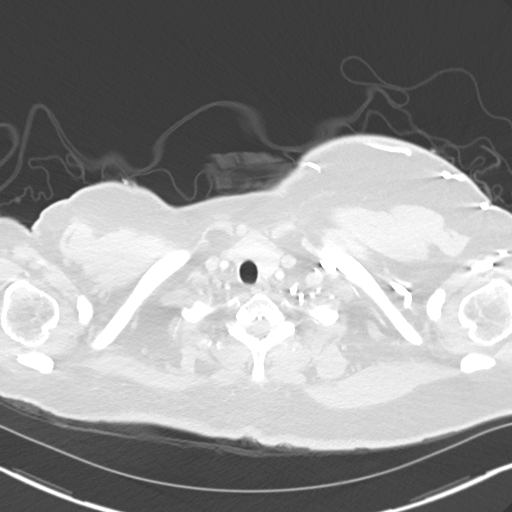

[Series 6: coronal · coronal · 0.54mm/px · 3 of 151 slices shown]
[im 31/151  lung]
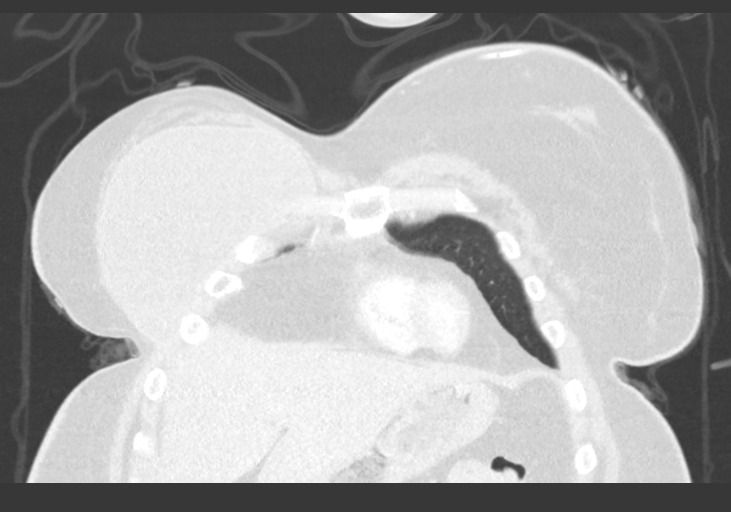
[im 61/151  lung]
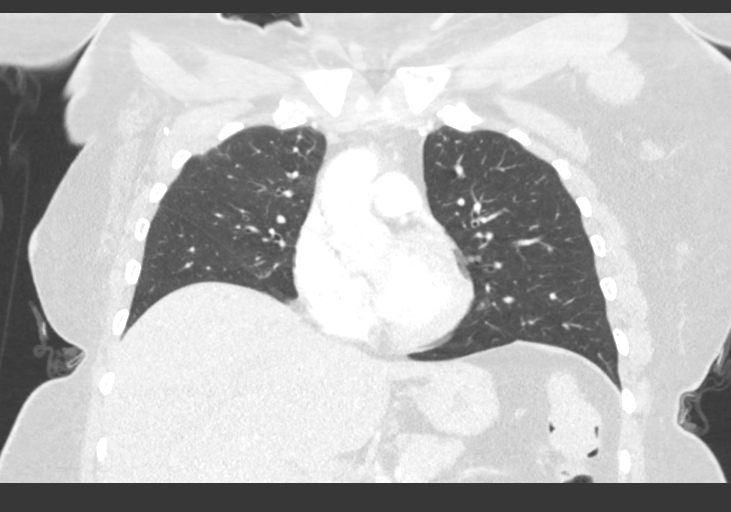
[im 91/151  lung]
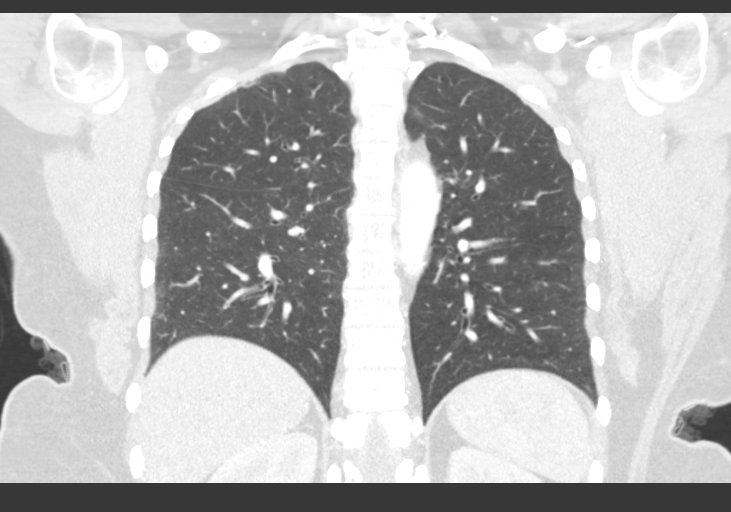

[15 of 36 positions shown; findings below may reference images not displayed]

FINDINGS: Cardiovascular: The heart size is normal. No pericardial effusion.
No thoracic aortic aneurysm.

Mediastinum/Nodes: No mediastinal lymphadenopathy. There is no hilar
lymphadenopathy. The esophagus has normal imaging features. There is
no axillary lymphadenopathy.

Lungs/Pleura: 4 mm left lower lobe pulmonary nodule (image 75 series
5) is stable since prior study and also comparing back to
10/22/2009, consistent with benign process. No focal airspace
consolidation. No pulmonary edema or pleural effusion.

Upper Abdomen: Tiny hypoattenuating lesion in the dome of the liver
is stable back to 10/22/2009, likely a cyst. Otherwise unremarkable.

Musculoskeletal: Bone windows reveal no worrisome lytic or sclerotic
osseous lesions. Patient is status post right mastectomy with
reconstruction.
IMPRESSION: 1. Stable with no acute findings. Specifically, no findings to
explain the patient's history of shortness of breath.

## 2018-02-07 ENCOUNTER — Ambulatory Visit: Payer: Medicare Other

## 2018-02-08 ENCOUNTER — Ambulatory Visit: Payer: Medicare Other | Attending: Registered Nurse | Admitting: Physical Therapy

## 2018-02-08 ENCOUNTER — Ambulatory Visit: Payer: Medicare Other | Admitting: Physical Therapy

## 2018-02-08 DIAGNOSIS — R293 Abnormal posture: Secondary | ICD-10-CM | POA: Insufficient documentation

## 2018-02-08 DIAGNOSIS — I972 Postmastectomy lymphedema syndrome: Secondary | ICD-10-CM | POA: Insufficient documentation

## 2018-02-08 DIAGNOSIS — M25611 Stiffness of right shoulder, not elsewhere classified: Secondary | ICD-10-CM | POA: Insufficient documentation

## 2018-02-08 DIAGNOSIS — M25511 Pain in right shoulder: Secondary | ICD-10-CM | POA: Insufficient documentation

## 2018-02-08 DIAGNOSIS — G8929 Other chronic pain: Secondary | ICD-10-CM | POA: Insufficient documentation

## 2018-02-09 ENCOUNTER — Ambulatory Visit: Payer: Medicare Other | Admitting: Physical Therapy

## 2018-02-13 ENCOUNTER — Encounter: Payer: Self-pay | Admitting: Rehabilitation

## 2018-02-13 ENCOUNTER — Telehealth: Payer: Self-pay | Admitting: Physical Therapy

## 2018-02-13 ENCOUNTER — Ambulatory Visit: Payer: Medicare Other | Admitting: Rehabilitation

## 2018-02-13 ENCOUNTER — Other Ambulatory Visit: Payer: Self-pay

## 2018-02-13 ENCOUNTER — Ambulatory Visit: Payer: Medicare Other | Admitting: Physical Therapy

## 2018-02-13 DIAGNOSIS — G8929 Other chronic pain: Secondary | ICD-10-CM

## 2018-02-13 DIAGNOSIS — R293 Abnormal posture: Secondary | ICD-10-CM | POA: Diagnosis not present

## 2018-02-13 DIAGNOSIS — I972 Postmastectomy lymphedema syndrome: Secondary | ICD-10-CM

## 2018-02-13 DIAGNOSIS — M25511 Pain in right shoulder: Secondary | ICD-10-CM | POA: Diagnosis not present

## 2018-02-13 DIAGNOSIS — M25611 Stiffness of right shoulder, not elsewhere classified: Secondary | ICD-10-CM | POA: Diagnosis not present

## 2018-02-13 NOTE — Telephone Encounter (Signed)
Attempted to contact pt today regarding multiple no shows, last week and today, for PT appointment.  Left message reminding pt of upcoming visits and requesting that pt contact office if she won't be able to attend the appointment.    Last week pt did cancel one visit due to increased knee pain; attempted to contact pt last week about knee pain and attempted to follow up with pt on knee pain today but unable due to no answer.   Rico Junker, PT, DPT 02/13/18    11:30 AM

## 2018-02-13 NOTE — Therapy (Signed)
[place], [place], [address] Phone: [phone]   Fax:  [phone]  Physical Therapy Evaluation  Patient Details  Name: Karen Hopkins MRN: [id number] Date of Birth: [birth year] Referring Provider: Dr. Jana Hakim   Encounter Date: 02/13/2018  PT End of Session - 02/13/18 1725    Visit Number  4   1 for lymphedema   Number of Visits  17    Date for PT Re-Evaluation  03/18/18   03/13/18 for lymphedema   PT Start Time  1350    PT Stop Time  1438    PT Time Calculation (min)  48 min    Activity Tolerance  Patient tolerated treatment well    Behavior During Therapy  [facility] for tasks assessed/performed       Past Medical History:  Diagnosis Date  . Anxiety   . Cancer ([place]) 2011   Rt. Br. Ca  . Depression   . Essential hypertension 11/05/2015  . History of breast cancer 2011   right  . History of chemotherapy 2011  . History of radiation therapy 2011  . Hypertension    under control with med., has been on med. x 2 yr.  . Lymphedema of arm    right; no BP or puncture to right arm  . Personal history of chemotherapy 09/16/2009   rt breast  . Personal history of radiation therapy 05/2010   rt breast  . Pneumonia   . Seasonal allergies   . Sinus headache     Past Surgical History:  Procedure Laterality Date  . BREAST BIOPSY Right 08/26/2009  . BREAST REDUCTION SURGERY Left 08/23/2013   Procedure: LEFT BREAST REDUCTION  ;  Surgeon: Theodoro Kos, DO;  Location: [place];  Service: Plastics;  Laterality: Left;  . CESAREAN SECTION  07/15/2001; 03/18/2004; 11/21/2008  . lateral orbiotomy Left 11/2015   Dr. Toy Cookey at [facility]  . LATISSIMUS FLAP TO BREAST Right 03/12/2013   Procedure: RIGHT BREAST LATISSIMUS FLAP WITH EXPANDER PLACEMENT;  Surgeon: Theodoro Kos, DO;  Location: [place];  Service: Plastics;  Laterality: Right;  . LIPOSUCTION Bilateral 08/23/2013   Procedure: LIPOSUCTION;  Surgeon:  Theodoro Kos, DO;  Location: [place];  Service: Plastics;  Laterality: Bilateral;  . MASTECTOMY Right 2011  . MODIFIED RADICAL MASTECTOMY Right 03/11/2010  . NASAL SEPTUM SURGERY    . PORT-A-CATH REMOVAL Left 12/01/2010  . PORTACATH PLACEMENT Left 09/12/2009  . REDUCTION MAMMAPLASTY Left 08/2013  . REMOVAL OF TISSUE EXPANDER AND PLACEMENT OF IMPLANT Right 08/23/2013   Procedure: REMOVAL RIGHT TISSUE EXPANDER AND PLACEMENT OF IMPLANT TO RIGHT BREAST ;  Surgeon: Theodoro Kos, DO;  Location: [place];  Service: Plastics;  Laterality: Right;  . SUPRA-UMBILICAL HERNIA  3710  . TUBAL LIGATION  11/21/2008    There were no vitals filed for this visit.   Subjective Assessment - 02/13/18 1353    Subjective  It is just flaring up.  I am supposed to be using a cane again. I am currently using night sleeve only.  The daily garments need to be remeasured.      Pertinent History  Rt breast cancer with mastectomy 2011 with delayed reconstruction and ALND.  Lt breast reduction.  H/O Rt UE lymphedema, history of radiation, HTN, balance issues, cervical dystonia    Limitations  Lifting    Patient Stated Goals  Reduce the Rt UE swelling.      Currently in Pain?  No/denies  up to 5/1o at the worst when it gets heavy   Pain Location  Arm    Pain Orientation  Right    Pain Descriptors / Indicators  Aching    Pain Type  Surgical pain    Pain Onset  More than a month ago    Pain Frequency  Intermittent    Aggravating Factors   heat and use    Pain Relieving Factors  rest         [facility] PT Assessment - 02/13/18 0001      Assessment   Medical Diagnosis  Rt breast cancer/lymphedema    Referring Provider  Dr. Jana Hakim    Onset Date/Surgical Date  10/05/09    Hand Dominance  Right    Next MD Visit  unknown    Prior Therapy  yes      Precautions   Precaution Comments   anxiety and depression, chronic use of opioids, neuropathic pain, bilat knee OA/pain, low back pain,  cervical dystonia treated with injection, essential HTN, R breast cancer with R radical mastectomy, chemo, radiation and lymphedema - RUE restricted for BP      Restrictions   Weight Bearing Restrictions  No      Balance Screen   Has the patient fallen in the past 6 months  Yes    How many times?  >10    Has the patient had a decrease in activity level because of a fear of falling?   No    Is the patient reluctant to leave their home because of a fear of falling?   No      Home Environment   Living Environment  Private residence    Living Arrangements  Children    Type of [place] Access  Level entry    Roe - single point    Additional Comments  3 boys - play football and basketball.        Prior Function   Level of Independence  Needs assistance with homemaking;Needs assistance with ADLs;Other (comment)    Vocation  On disability    Leisure  going to son's football and basketball games but fatigues quickly      Coordination   Gross Motor Movements are Fluid and Coordinated  Yes        LYMPHEDEMA/ONCOLOGY QUESTIONNAIRE - 02/13/18 1408      Type   Cancer Type  right inflammatory breast cancer      Surgeries   Mastectomy Date  --   10/05/2009   Saline Implant Reconstruction Date  --   2015 lat flap   Axillary Lymph Node Dissection Date  10/05/09    Number Lymph Nodes Removed  22      Date Lymphedema/Swelling Started   Date  --   2011     Treatment   Past Chemotherapy Treatment  Yes    Past Radiation Treatment  Yes      What other symptoms do you have   Are you having Pain  Yes    Are you having pitting edema  Yes      Right Upper Extremity Lymphedema   15 cm Proximal to Olecranon Process  48 cm    10 cm Proximal to Olecranon Process  46.8 cm    Olecranon Process  32.5 cm    15 cm Proximal to Ulnar Styloid Process  27.2 cm    10  cm Proximal to Ulnar Styloid Process  21.7 cm    Just Proximal to Ulnar Styloid  Process  18.1 cm    Across Hand at [employer]  21.4 cm    At [place] of 2nd Digit  6.5 cm      Left Upper Extremity Lymphedema   15 cm Proximal to Olecranon Process  44.3 cm    10 cm Proximal to Olecranon Process  42 cm    Olecranon Process  30.5 cm    10 cm Proximal to Ulnar Styloid Process  23.5 cm    Just Proximal to Ulnar Styloid Process  18.1 cm    Across Hand at [employer]  20.8 cm    At [place] of 2nd Digit  6.5 cm             Objective measurements completed on examination: See above findings.      [place] Adult PT Treatment/Exercise - 02/13/18 0001      Manual Therapy   Manual Therapy  Compression Bandaging    Compression Bandaging  Rt UE: lotion, thin stockinette, mollelast fingers 1-5, artiflex padding to the hand and slightly up the arm, rosidal from wrist to axilla, 1-6cm hand bandage, 2-8cm bandages herringbone for lower arm into spiral upper arm, and 1-10cm to the axilla             PT Education - 02/13/18 1725    Education Details  POC    Trivedi(s) Educated  Patient    Methods  Explanation    Comprehension  Verbalized understanding       PT Short Term Goals - 02/01/18 1941      PT SHORT TERM GOAL #1   Title  Pt will participate in further assessment of balance with BERG    Baseline  45/56    Time  4    Period  Weeks    Status  Achieved      PT SHORT TERM GOAL #2   Title  Pt will improve LE strength as indicated by decrease in five time sit to stand to </= 15 seconds from mat without use of UE    Baseline  19 seconds from mat    Time  4    Period  Weeks    Status  New    Target Date  02/16/18      PT SHORT TERM GOAL #3   Title  Pt will improve gait velocity to >/= 2.6 ft/sec with LRAD to decrease risk for falls in home/community    Baseline  2.45    Time  4    Period  Weeks    Status  New    Target Date  02/16/18      PT SHORT TERM GOAL #4   Title  Pt will ambulate with LRAD x 200' over outdoor surfaces with supervision to  improve safety when ambulating at son's football games    Time  4    Period  Weeks    Status  New    Target Date  02/16/18      PT SHORT TERM GOAL #5   Title  Pt will report 25% reduction in # of falls/near falls     Time  4    Period  Weeks    Status  New    Target Date  02/16/18        PT Long Term Goals - 02/13/18 1733      Additional Long Term  Goals   Additional Long Term Goals  Yes      PT LONG TERM GOAL #7   Title  Pt will reduce 15cm proximal to olecranon to at least 43cm or less    Baseline  48    Time  4    Period  Weeks    Status  New    Target Date  03/13/18      PT LONG TERM GOAL #8   Title  Pt will reduce10cm proximal to the olecranon process to at least 43 or less     Baseline  46.8    Time  4    Period  Weeks    Status  New    Target Date  03/13/18      PT LONG TERM GOAL  #9   TITLE  Pt will be knowledgeable about where to get new compression garments (velcro?)    Time  4    Period  Weeks    Status  New    Target Date  03/13/18             Plan - 02/13/18 1727    Clinical Impression Statement  Mischelle returns to lymphedema clinic with new exaccerbation of Rt UE lymphedema.  She reports that she is not using the pump (emailed derek to see if it could be looked at), not wearing compression except intermittently at night.  Her Rt upper extremity has an increase in size of almost 3-5cm from last discharge here.  Pt agreeable to another round of CDT    History and Personal Factors relevant to plan of care:  Rt breast cancer with mastectomy 2011 with delayed reconstruction and ALND.  Lt breast reduction.  H/O Rt UE lymphedema, history of radiation, HTN, balance issues, cervical dystonia    Clinical Presentation  Evolving    Clinical Presentation due to:  worsening of edema    Clinical Decision Making  Moderate    Rehab Potential  Fair    Clinical Impairments Affecting Rehab Potential  non compliance with already estalished care from this facility      PT Frequency  3x / week    PT Duration  4 weeks    PT Treatment/Interventions  ADLs/Self Care Home Management;Aquatic Therapy;Electrical Stimulation;DME Instruction;Gait training;Functional mobility training;Therapeutic activities;Therapeutic exercise;Balance training;Neuromuscular re-education;Patient/family education;Orthotic Fit/Training;Manual lymph drainage;Compression bandaging;Taping;Passive range of motion    PT Next Visit Plan  Rt UE CDT, referral back for velcro garment?       Patient will benefit from skilled therapeutic intervention in order to improve the following deficits and impairments:  Increased edema  Visit Diagnosis: Postmastectomy lymphedema - Plan: PT plan of care cert/re-cert  Chronic right shoulder pain - Plan: PT plan of care cert/re-cert  Abnormal posture - Plan: PT plan of care cert/re-cert     Problem List Patient Active Problem List   Diagnosis Date Noted  . Dyspnea 09/23/2016  . Eczema 08/31/2016  . Essential hypertension 11/05/2015  . Cervical dystonia 08/25/2015  . Headache 07/09/2015  . Myofascial muscle pain 04/29/2015  . Adhesive capsulitis of right shoulder 04/29/2015  . Type 2 diabetes mellitus without complication ([place]) 29/51/8841  . Lower extremity edema 10/01/2014  . Malignant neoplasm of overlapping sites of right breast in female, estrogen receptor positive ([place]) 05/24/2014  . Myalgia and myositis 05/17/2014  . Neoplasm related pain 03/12/2014  . CN (constipation) 11/26/2013  . H/O reduction mammoplasty 10/30/2013  . Seizure ([place]) 10/06/2013  . Status post bilateral breast implants 08/31/2013  .  Acquired absence of breast and absent nipple 08/23/2013  . S/P breast reconstruction, right 08/23/2013  . AC joint arthropathy 07/02/2013  . Routine adult health maintenance 05/29/2013  . Hypokalemia 05/21/2013  . Chronic pain 04/19/2013  . Elevated blood sugar 03/09/2013  . Acute bronchitis 02/22/2013  . Lymphedema 05/24/2011  .  RHINOSINUSITIS, CHRONIC 04/08/2010  . Obesity 08/04/2006  . DEPRESSIVE DISORDER, NOS 08/04/2006  . Allergic rhinitis 08/04/2006    Shan Levans, PT 02/13/2018, 5:37 PM  [place], [place], [address] Phone: [phone]   Fax:  [phone]  Name: Karen Hopkins MRN: [id number] Date of Birth: April 26, 1976

## 2018-02-14 ENCOUNTER — Ambulatory Visit: Payer: Medicare Other | Admitting: Physical Therapy

## 2018-02-15 ENCOUNTER — Ambulatory Visit: Payer: Medicare Other | Admitting: Physical Therapy

## 2018-02-15 ENCOUNTER — Encounter: Payer: Self-pay | Admitting: General Practice

## 2018-02-15 NOTE — Progress Notes (Signed)
[place] Spiritual Care Note  Mailed Karen Hopkins a handwritten card to offer support and encouragement as she faces her mother's first birthday since her mother died.    [place], [place], [facility] Pager [phone] Voicemail [phone]

## 2018-02-16 ENCOUNTER — Ambulatory Visit: Payer: Medicare Other | Admitting: Rehabilitation

## 2018-02-17 ENCOUNTER — Encounter: Payer: Self-pay | Admitting: Rehabilitation

## 2018-02-17 ENCOUNTER — Ambulatory Visit: Payer: Medicare Other | Admitting: Rehabilitation

## 2018-02-17 ENCOUNTER — Inpatient Hospital Stay: Payer: Medicare Other | Attending: Oncology

## 2018-02-17 DIAGNOSIS — R293 Abnormal posture: Secondary | ICD-10-CM | POA: Diagnosis not present

## 2018-02-17 DIAGNOSIS — C50011 Malignant neoplasm of nipple and areola, right female breast: Secondary | ICD-10-CM | POA: Insufficient documentation

## 2018-02-17 DIAGNOSIS — M25511 Pain in right shoulder: Secondary | ICD-10-CM | POA: Diagnosis not present

## 2018-02-17 DIAGNOSIS — M25611 Stiffness of right shoulder, not elsewhere classified: Secondary | ICD-10-CM

## 2018-02-17 DIAGNOSIS — Z170 Estrogen receptor positive status [ER+]: Secondary | ICD-10-CM | POA: Diagnosis not present

## 2018-02-17 DIAGNOSIS — I972 Postmastectomy lymphedema syndrome: Secondary | ICD-10-CM

## 2018-02-17 DIAGNOSIS — G8929 Other chronic pain: Secondary | ICD-10-CM

## 2018-02-17 MED ORDER — GOSERELIN ACETATE 3.6 MG ~~LOC~~ IMPL
3.6000 mg | DRUG_IMPLANT | Freq: Once | SUBCUTANEOUS | Status: AC
  Administered 2018-02-17: 3.6 mg via SUBCUTANEOUS

## 2018-02-17 MED ORDER — GOSERELIN ACETATE 3.6 MG ~~LOC~~ IMPL
DRUG_IMPLANT | SUBCUTANEOUS | Status: AC
  Filled 2018-02-17: qty 3.6

## 2018-02-17 NOTE — Therapy (Signed)
[place], [place], [address] Phone: [phone]   Fax:  [phone]  Physical Therapy Treatment  Patient Details  Name: Karen Hopkins MRN: [id number] Date of Birth: Mar 09, 1976 Referring Provider: Dr. Jana Hakim   Encounter Date: 02/17/2018  PT End of Session - 02/17/18 1129    Visit Number  5   2 for lymphedema   Number of Visits  17    Date for PT Re-Evaluation  03/18/18   10/19 for lymphedema   Authorization Type  [employer] Medicare and Medicaid - pt has no financial responsibility    PT Start Time  1030    PT Stop Time  1120    PT Time Calculation (min)  50 min    Activity Tolerance  Patient tolerated treatment well    Behavior During Therapy  [facility] for tasks assessed/performed       Past Medical History:  Diagnosis Date  . Anxiety   . Cancer ([place]) 2011   Rt. Br. Ca  . Depression   . Essential hypertension 11/05/2015  . History of breast cancer 2011   right  . History of chemotherapy 2011  . History of radiation therapy 2011  . Hypertension    under control with med., has been on med. x 2 yr.  . Lymphedema of arm    right; no BP or puncture to right arm  . Personal history of chemotherapy 09/16/2009   rt breast  . Personal history of radiation therapy 05/2010   rt breast  . Pneumonia   . Seasonal allergies   . Sinus headache     Past Surgical History:  Procedure Laterality Date  . BREAST BIOPSY Right 08/26/2009  . BREAST REDUCTION SURGERY Left 08/23/2013   Procedure: LEFT BREAST REDUCTION  ;  Surgeon: Theodoro Kos, DO;  Location: [place];  Service: Plastics;  Laterality: Left;  . CESAREAN SECTION  07/15/2001; 03/18/2004; 11/21/2008  . lateral orbiotomy Left 11/2015   Dr. Toy Cookey at [facility]  . LATISSIMUS FLAP TO BREAST Right 03/12/2013   Procedure: RIGHT BREAST LATISSIMUS FLAP WITH EXPANDER PLACEMENT;  Surgeon: Theodoro Kos, DO;  Location: [place];  Service: Plastics;   Laterality: Right;  . LIPOSUCTION Bilateral 08/23/2013   Procedure: LIPOSUCTION;  Surgeon: Theodoro Kos, DO;  Location: [place];  Service: Plastics;  Laterality: Bilateral;  . MASTECTOMY Right 2011  . MODIFIED RADICAL MASTECTOMY Right 03/11/2010  . NASAL SEPTUM SURGERY    . PORT-A-CATH REMOVAL Left 12/01/2010  . PORTACATH PLACEMENT Left 09/12/2009  . REDUCTION MAMMAPLASTY Left 08/2013  . REMOVAL OF TISSUE EXPANDER AND PLACEMENT OF IMPLANT Right 08/23/2013   Procedure: REMOVAL RIGHT TISSUE EXPANDER AND PLACEMENT OF IMPLANT TO RIGHT BREAST ;  Surgeon: Theodoro Kos, DO;  Location: [place];  Service: Plastics;  Laterality: Right;  . SUPRA-UMBILICAL HERNIA  9509  . TUBAL LIGATION  11/21/2008    There were no vitals filed for this visit.  Subjective Assessment - 02/17/18 1124    Subjective  The wrap did really well.  it stayed up.  i did take the finger bandages off and the fingers seemed fine.  My Rt ankle is swollen today    Pertinent History  Rt breast cancer with mastectomy 2011 with delayed reconstruction and ALND.  Lt breast reduction.  H/O Rt UE lymphedema, history of radiation, HTN, balance issues, cervical dystonia    Limitations  Lifting    Currently in Pain?  No/denies  [place] Adult PT Treatment/Exercise - 02/17/18 0001      Self-Care   Self-Care  Other Self-Care Comments    Other Self-Care Comments   Lt ankle elevation, AROM, icing, getting it checked if it gets worse, gave pt information on picking a new medicaid plan      Manual Therapy   Manual Therapy  Compression Bandaging;Manual Lymphatic Drainage (MLD);Passive ROM    Manual Lymphatic Drainage (MLD)  performed MLD in supine with Rt LE elevated due to swollen ankle for Rt UE: short neck, abdominals with resisted breathing, Rt axillary nodes, interaxillary pathway, Rt inguinal nodes, Rt axillo inguinal pathway, focus on Rt upper arm with repeat of all steps     Compression Bandaging  Rt UE: lotion, thin stockinette, artiflex padding to the hand and slightly up the arm, rosidal from wrist to axilla, 1-6cm hand bandage, 2-8cm bandages herringbone for lower arm into spiral upper arm, and 1-10cm to the axilla    Passive ROM  to Rt UE into flexion, abduction, and chest stretch position               PT Short Term Goals - 02/01/18 1941      PT SHORT TERM GOAL #1   Title  Pt will participate in further assessment of balance with BERG    Baseline  45/56    Time  4    Period  Weeks    Status  Achieved      PT SHORT TERM GOAL #2   Title  Pt will improve LE strength as indicated by decrease in five time sit to stand to </= 15 seconds from mat without use of UE    Baseline  19 seconds from mat    Time  4    Period  Weeks    Status  New    Target Date  02/16/18      PT SHORT TERM GOAL #3   Title  Pt will improve gait velocity to >/= 2.6 ft/sec with LRAD to decrease risk for falls in home/community    Baseline  2.45    Time  4    Period  Weeks    Status  New    Target Date  02/16/18      PT SHORT TERM GOAL #4   Title  Pt will ambulate with LRAD x 200' over outdoor surfaces with supervision to improve safety when ambulating at son's football games    Time  4    Period  Weeks    Status  New    Target Date  02/16/18      PT SHORT TERM GOAL #5   Title  Pt will report 25% reduction in # of falls/near falls     Time  4    Period  Weeks    Status  New    Target Date  02/16/18        PT Long Term Goals - 02/13/18 1733      Additional Long Term Goals   Additional Long Term Goals  Yes      PT LONG TERM GOAL #7   Title  Pt will reduce 15cm proximal to olecranon to at least 43cm or less    Baseline  48    Time  4    Period  Weeks    Status  New    Target Date  03/13/18      PT LONG TERM GOAL #8   Title  Pt will reduce10cm  proximal to the olecranon process to at least 43 or less     Baseline  46.8    Time  4    Period  Weeks     Status  New    Target Date  03/13/18      PT LONG TERM GOAL  #9   TITLE  Pt will be knowledgeable about where to get new compression garments (velcro?)    Time  4    Period  Weeks    Status  New    Target Date  03/13/18            Plan - 02/17/18 1130    Clinical Impression Statement  Randye arrives today with a swollen dorsum of the Lt foot and into the ankle for no known reason.  Swelling decreased with elevation during MLD so she was instructed to elevate, ice, and perform AROM at home.  She is already using a cane for that side.  Lowered cane by 1 hole due to shoulder elevation with arm wrapped during gait.  She has painful Rt shoulder PROM end range flexion, abduction, and radiation stretched position with a pull into the pectoralis.  She was instructed to change her bra to one that does not roll and dig into her sides to promote drainage    Rehab Potential  Fair    Clinical Impairments Affecting Rehab Potential  non compliance with already estalished care from this facility     PT Frequency  3x / week    PT Duration  4 weeks    PT Treatment/Interventions  ADLs/Self Care Home Management;Aquatic Therapy;Electrical Stimulation;DME Instruction;Gait training;Functional mobility training;Therapeutic activities;Therapeutic exercise;Balance training;Neuromuscular re-education;Patient/family education;Orthotic Fit/Training;Manual lymph drainage;Compression bandaging;Taping;Passive range of motion    PT Next Visit Plan  Rt UE CDT, referral back for velcro garment?, will need measured by [facility] or [place] with delivery by [facility] due to [employer] and Agree with Plan of Care  Patient       Patient will benefit from skilled therapeutic intervention in order to improve the following deficits and impairments:  Increased edema, Decreased range of motion, Pain  Visit Diagnosis: Postmastectomy lymphedema  Chronic right shoulder pain  Abnormal posture  Stiffness of right shoulder,  not elsewhere classified     Problem List Patient Active Problem List   Diagnosis Date Noted  . Dyspnea 09/23/2016  . Eczema 08/31/2016  . Essential hypertension 11/05/2015  . Cervical dystonia 08/25/2015  . Headache 07/09/2015  . Myofascial muscle pain 04/29/2015  . Adhesive capsulitis of right shoulder 04/29/2015  . Type 2 diabetes mellitus without complication ([place]) 16/06/930  . Lower extremity edema 10/01/2014  . Malignant neoplasm of overlapping sites of right breast in female, estrogen receptor positive ([place]) 05/24/2014  . Myalgia and myositis 05/17/2014  . Neoplasm related pain 03/12/2014  . CN (constipation) 11/26/2013  . H/O reduction mammoplasty 10/30/2013  . Seizure ([place]) 10/06/2013  . Status post bilateral breast implants 08/31/2013  . Acquired absence of breast and absent nipple 08/23/2013  . S/P breast reconstruction, right 08/23/2013  . AC joint arthropathy 07/02/2013  . Routine adult health maintenance 05/29/2013  . Hypokalemia 05/21/2013  . Chronic pain 04/19/2013  . Elevated blood sugar 03/09/2013  . Acute bronchitis 02/22/2013  . Lymphedema 05/24/2011  . RHINOSINUSITIS, CHRONIC 04/08/2010  . Obesity 08/04/2006  . DEPRESSIVE DISORDER, NOS 08/04/2006  . Allergic rhinitis 08/04/2006    Shan Levans, PT 02/17/2018, 11:34 AM  [place] [phone]  [place], [place], [address] Phone: [phone]   Fax:  [phone]  Name: Fynn Adel Hai MRN: [id number] Date of Birth: [birth year]

## 2018-02-21 ENCOUNTER — Ambulatory Visit: Payer: Medicare Other

## 2018-02-21 DIAGNOSIS — R293 Abnormal posture: Secondary | ICD-10-CM | POA: Diagnosis not present

## 2018-02-21 DIAGNOSIS — I972 Postmastectomy lymphedema syndrome: Secondary | ICD-10-CM | POA: Diagnosis not present

## 2018-02-21 DIAGNOSIS — M25511 Pain in right shoulder: Secondary | ICD-10-CM

## 2018-02-21 DIAGNOSIS — G8929 Other chronic pain: Secondary | ICD-10-CM

## 2018-02-21 DIAGNOSIS — M25611 Stiffness of right shoulder, not elsewhere classified: Secondary | ICD-10-CM

## 2018-02-21 NOTE — Therapy (Addendum)
[place], [place], [address] Phone: [phone]   Fax:  [phone]  Physical Therapy Treatment  Patient Details  Name: Karen Hopkins MRN: [id number] Date of Birth: Jan 22, 1976 Referring Provider: Dr. Jana Hakim   Encounter Date: 02/21/2018  PT End of Session - 02/21/18 1020    Visit Number  6   3 for lymphedema   Number of Visits  17    Date for PT Re-Evaluation  03/18/18   10/19 for lymphedema   PT Start Time  0937    PT Stop Time  1018    PT Time Calculation (min)  41 min    Activity Tolerance  Patient tolerated treatment well    Behavior During Therapy  [facility] for tasks assessed/performed       Past Medical History:  Diagnosis Date  . Anxiety   . Cancer ([place]) 2011   Rt. Br. Ca  . Depression   . Essential hypertension 11/05/2015  . History of breast cancer 2011   right  . History of chemotherapy 2011  . History of radiation therapy 2011  . Hypertension    under control with med., has been on med. x 2 yr.  . Lymphedema of arm    right; no BP or puncture to right arm  . Personal history of chemotherapy 09/16/2009   rt breast  . Personal history of radiation therapy 05/2010   rt breast  . Pneumonia   . Seasonal allergies   . Sinus headache     Past Surgical History:  Procedure Laterality Date  . BREAST BIOPSY Right 08/26/2009  . BREAST REDUCTION SURGERY Left 08/23/2013   Procedure: LEFT BREAST REDUCTION  ;  Surgeon: Theodoro Kos, DO;  Location: [place];  Service: Plastics;  Laterality: Left;  . CESAREAN SECTION  07/15/2001; 03/18/2004; 11/21/2008  . lateral orbiotomy Left 11/2015   Dr. Toy Cookey at [facility]  . LATISSIMUS FLAP TO BREAST Right 03/12/2013   Procedure: RIGHT BREAST LATISSIMUS FLAP WITH EXPANDER PLACEMENT;  Surgeon: Theodoro Kos, DO;  Location: [place];  Service: Plastics;  Laterality: Right;  . LIPOSUCTION Bilateral 08/23/2013   Procedure: LIPOSUCTION;  Surgeon:  Theodoro Kos, DO;  Location: [place];  Service: Plastics;  Laterality: Bilateral;  . MASTECTOMY Right 2011  . MODIFIED RADICAL MASTECTOMY Right 03/11/2010  . NASAL SEPTUM SURGERY    . PORT-A-CATH REMOVAL Left 12/01/2010  . PORTACATH PLACEMENT Left 09/12/2009  . REDUCTION MAMMAPLASTY Left 08/2013  . REMOVAL OF TISSUE EXPANDER AND PLACEMENT OF IMPLANT Right 08/23/2013   Procedure: REMOVAL RIGHT TISSUE EXPANDER AND PLACEMENT OF IMPLANT TO RIGHT BREAST ;  Surgeon: Theodoro Kos, DO;  Location: [place];  Service: Plastics;  Laterality: Right;  . SUPRA-UMBILICAL HERNIA  1517  . TUBAL LIGATION  11/21/2008    There were no vitals filed for this visit.  Subjective Assessment - 02/21/18 0940    Subjective  My Lt ankle has started swelling recently, I'm not sure why. I need to make an appt for my doctor to look at it. I took my bandages off yesterday.     Pertinent History  Rt breast cancer with mastectomy 2011 with delayed reconstruction and ALND.  Lt breast reduction.  H/O Rt UE lymphedema, history of radiation, HTN, balance issues, cervical dystonia    Patient Stated Goals  Reduce the Rt UE swelling.      Currently in Pain?  No/denies  [place] Adult PT Treatment/Exercise - 02/21/18 0001      Manual Therapy   Manual Lymphatic Drainage (MLD)  performed MLD in supine to Rt UE: short neck, superficial and deep abdominals, Lt axillary nodes, interaxillary pathway, Rt inguinal nodes, Rt axillo inguinal pathway, focus on Rt upper arm but worked from proximal to distal down to proximal hand retracing all steps.     Compression Bandaging  Rt UE: lotion, thin stockinette, artiflex padding to the hand and slightly up the arm, rosidal from wrist to axilla, 1-6cm hand bandage, 2-8cm bandages herringbone for lower arm into spiral upper arm, and 1-10cm and 1-12 cm to the axilla    Passive ROM  to Rt UE into flexion, abduction, and chest stretch  position               PT Short Term Goals - 02/01/18 1941      PT SHORT TERM GOAL #1   Title  Pt will participate in further assessment of balance with BERG    Baseline  45/56    Time  4    Period  Weeks    Status  Achieved      PT SHORT TERM GOAL #2   Title  Pt will improve LE strength as indicated by decrease in five time sit to stand to </= 15 seconds from mat without use of UE    Baseline  19 seconds from mat    Time  4    Period  Weeks    Status  New    Target Date  02/16/18      PT SHORT TERM GOAL #3   Title  Pt will improve gait velocity to >/= 2.6 ft/sec with LRAD to decrease risk for falls in home/community    Baseline  2.45    Time  4    Period  Weeks    Status  New    Target Date  02/16/18      PT SHORT TERM GOAL #4   Title  Pt will ambulate with LRAD x 200' over outdoor surfaces with supervision to improve safety when ambulating at son's football games    Time  4    Period  Weeks    Status  New    Target Date  02/16/18      PT SHORT TERM GOAL #5   Title  Pt will report 25% reduction in # of falls/near falls     Time  4    Period  Weeks    Status  New    Target Date  02/16/18        PT Long Term Goals - 02/13/18 1733      Additional Long Term Goals   Additional Long Term Goals  Yes      PT LONG TERM GOAL #7   Title  Pt will reduce 15cm proximal to olecranon to at least 43cm or less    Baseline  48    Time  4    Period  Weeks    Status  New    Target Date  03/13/18      PT LONG TERM GOAL #8   Title  Pt will reduce10cm proximal to the olecranon process to at least 43 or less     Baseline  46.8    Time  4    Period  Weeks    Status  New    Target Date  03/13/18      PT LONG  TERM GOAL  #9   TITLE  Pt will be knowledgeable about where to get new compression garments (velcro?)    Time  4    Period  Weeks    Status  New    Target Date  03/13/18            Plan - 02/21/18 1021    Clinical Impression Statement  Karen Hopkins still  reporting swelling at Lt ankle but wearing a brace today that she bought and reports this has been helping some. She has yet to make an appt with her doctor to address this. She removed her bandagesyesterday reprting she got her appts mixed up and thought it was yesterday. Continued to encourage her to try to become inependent with bandaging. Added new 12 cm bandage to other bandages and she reports this felt good and tighter as she likes. Also continued with brief P/ROM to Rt shoulder which pt reported feeling good. She reports feeling some depressed as her mom passed away from breast CA in 03-Nov-2022 and her moms birthday is coming up. Talked with her about visiting her dad as she reports this helps her feel better.     Rehab Potential  Fair    Clinical Impairments Affecting Rehab Potential  non compliance with already estalished care from this facility     PT Frequency  3x / week    PT Duration  4 weeks    PT Treatment/Interventions  ADLs/Self Care Home Management;Aquatic Therapy;Electrical Stimulation;DME Instruction;Gait training;Functional mobility training;Therapeutic activities;Therapeutic exercise;Balance training;Neuromuscular re-education;Patient/family education;Orthotic Fit/Training;Manual lymph drainage;Compression bandaging;Taping;Passive range of motion    PT Next Visit Plan  Rt UE CDT, referral back for velcro garment?, will need measured by [facility] or [place] with delivery by [facility] due to [employer] and Agree with Plan of Care  Patient       Patient will benefit from skilled therapeutic intervention in order to improve the following deficits and impairments:  Increased edema, Decreased range of motion, Pain  Visit Diagnosis: Postmastectomy lymphedema  Chronic right shoulder pain  Abnormal posture  Stiffness of right shoulder, not elsewhere classified     Problem List Patient Active Problem List   Diagnosis Date Noted  . Dyspnea 09/23/2016  . Eczema 08/31/2016  .  Essential hypertension 11/05/2015  . Cervical dystonia 08/25/2015  . Headache 07/09/2015  . Myofascial muscle pain 04/29/2015  . Adhesive capsulitis of right shoulder 04/29/2015  . Type 2 diabetes mellitus without complication ([place]) 78/24/2353  . Lower extremity edema 10/01/2014  . Malignant neoplasm of overlapping sites of right breast in female, estrogen receptor positive ([place]) 05/24/2014  . Myalgia and myositis 05/17/2014  . Neoplasm related pain 03/12/2014  . CN (constipation) 11/26/2013  . H/O reduction mammoplasty 10/30/2013  . Seizure ([place]) 10/06/2013  . Status post bilateral breast implants 08/31/2013  . Acquired absence of breast and absent nipple 08/23/2013  . S/P breast reconstruction, right 08/23/2013  . AC joint arthropathy 07/02/2013  . Routine adult health maintenance 05/29/2013  . Hypokalemia 05/21/2013  . Chronic pain 04/19/2013  . Elevated blood sugar 03/09/2013  . Acute bronchitis 02/22/2013  . Lymphedema 05/24/2011  . RHINOSINUSITIS, CHRONIC 04/08/2010  . Obesity 08/04/2006  . DEPRESSIVE DISORDER, NOS 08/04/2006  . Allergic rhinitis 08/04/2006    Otelia Limes, PTA 02/21/2018, 10:40 AM  [place], [place], [address] Phone: [phone]   Fax:  [phone]  Name: Karen Hopkins MRN: [id number] Date of Birth:  04-02-1976   PHYSICAL THERAPY DISCHARGE SUMMARY  Visits from Start of Care: 3  Current functional level related to goals / functional outcomes: Pt no showed x 3 after this visit   Remaining deficits: Need for Rt UE CDT and self care   Education / Equipment: See above Plan: Patient agrees to discharge.  Patient goals were not met. Patient is being discharged due to not returning since the last visit.  ?????      Shan Levans, PT

## 2018-02-22 ENCOUNTER — Telehealth: Payer: Self-pay | Admitting: Physical Therapy

## 2018-02-22 ENCOUNTER — Ambulatory Visit: Payer: Medicare Other | Admitting: Physical Therapy

## 2018-02-22 NOTE — Telephone Encounter (Signed)
Contacted pt again via phone regarding missed PT visit today.  Left message requesting call back to this clinic if she would like to continue with outpatient PT at the [facility].  If pt does not return phone call or confirm the rest of her visits, this PT will proceed with discharge from Neuro Clinic.  Pt to continue with lymphedema care.  Rico Junker, PT, DPT 02/22/18    10:15 AM

## 2018-02-23 ENCOUNTER — Ambulatory Visit: Payer: Medicare Other | Admitting: Physical Medicine & Rehabilitation

## 2018-02-23 ENCOUNTER — Ambulatory Visit: Payer: Medicare Other

## 2018-02-23 ENCOUNTER — Ambulatory Visit: Payer: Medicare Other | Admitting: Registered Nurse

## 2018-02-23 ENCOUNTER — Encounter: Payer: Self-pay | Admitting: Physical Therapy

## 2018-02-23 NOTE — Therapy (Signed)
[place] [address] [place], [place], [address] Phone: [phone]   Fax:  [phone]  Patient Details  Name: Karen Hopkins MRN: [id number] Date of Birth: Aug 11, 1975 Referring Provider:  No ref. provider found  Encounter Date: 02/23/2018  PHYSICAL THERAPY DISCHARGE SUMMARY  Visits from Start of Care: 3 (Neuro PT); pt also participating in PT for Lymphedema  Current functional level related to goals / functional outcomes: Unable to assess STG and LTG due to pt not returning after 3 visit.  Pt did participate in balance screen and initial gait training with new SPC but did not return for further training.  PT Short Term Goals - 02/23/18 1052      PT SHORT TERM GOAL #1   Title  Pt will participate in further assessment of balance with BERG    Baseline  45/56    Time  4    Period  Weeks    Status  Achieved      PT SHORT TERM GOAL #2   Title  Pt will improve LE strength as indicated by decrease in five time sit to stand to </= 15 seconds from mat without use of UE    Baseline  19 seconds from mat    Time  4    Period  Weeks    Status  Unable to assess      PT SHORT TERM GOAL #3   Title  Pt will improve gait velocity to >/= 2.6 ft/sec with LRAD to decrease risk for falls in home/community    Baseline  2.45    Time  4    Period  Weeks    Status  Unable to assess      PT SHORT TERM GOAL #4   Title  Pt will ambulate with LRAD x 200' over outdoor surfaces with supervision to improve safety when ambulating at son's football games    Time  4    Period  Weeks    Status  Unable to assess      PT SHORT TERM GOAL #5   Title  Pt will report 25% reduction in # of falls/near falls     Time  4    Period  Weeks    Status  Unable to assess      PT Long Term Goals - 02/23/18 1053      PT LONG TERM GOAL #1   Title  Pt will demonstrate independence with balance and strength HEP    Time  8    Period  Weeks    Status   Unable to assess      PT LONG TERM GOAL #2   Title  Pt will report 70% reduction in falls at home (when performing ADL/chores) and in the community    Time  8    Period  Weeks    Status  Unable to assess      PT LONG TERM GOAL #3   Title  Pt will improve five time sit to stand to </= 13 seconds without use of UE    Time  8    Period  Weeks    Status  Unable to assess      PT LONG TERM GOAL #4   Title  Will increase gait velocity to >/= 3.0 ft/sec with LRAD    Time  8    Period  Weeks    Status  Unable to assess      PT  LONG TERM GOAL #5   Title  Pt will increase BERG score by 8 points    Baseline  45/56    Time  8    Period  Weeks    Status  Unable to assess      PT LONG TERM GOAL #6   Title  Pt will ambulate x 500' over outdoor surface with LRAD and MOD I to improve independence with ambulation to son's football games    Time  8    Period  Weeks    Status  Unable to assess         Remaining deficits: Impaired balance, impaired safety and gait with cane   Education / Equipment: HEP, straight cane  Plan: Patient agrees to discharge.  Patient goals were not met. Patient is being discharged due to not returning since the last visit.  ?????     Rico Junker, PT, DPT 02/23/18    10:54 AM    [place] [address] [place], [place], [address] Phone: [phone]   Fax:  [phone]

## 2018-02-27 ENCOUNTER — Encounter: Payer: Medicare Other | Attending: Registered Nurse

## 2018-02-27 ENCOUNTER — Encounter: Payer: Self-pay | Admitting: Physical Medicine & Rehabilitation

## 2018-02-27 ENCOUNTER — Ambulatory Visit: Payer: Medicare Other | Admitting: Physical Therapy

## 2018-02-27 ENCOUNTER — Ambulatory Visit (HOSPITAL_BASED_OUTPATIENT_CLINIC_OR_DEPARTMENT_OTHER): Payer: Medicare Other | Admitting: Physical Medicine & Rehabilitation

## 2018-02-27 VITALS — BP 123/83 | HR 88 | Ht 64.0 in | Wt 237.0 lb

## 2018-02-27 DIAGNOSIS — I890 Lymphedema, not elsewhere classified: Secondary | ICD-10-CM | POA: Insufficient documentation

## 2018-02-27 DIAGNOSIS — G243 Spasmodic torticollis: Secondary | ICD-10-CM | POA: Diagnosis not present

## 2018-02-27 DIAGNOSIS — M546 Pain in thoracic spine: Secondary | ICD-10-CM | POA: Insufficient documentation

## 2018-02-27 MED ORDER — OXYCODONE HCL 5 MG PO TABS: 5.0000 mg | ORAL_TABLET | Freq: Four times a day (QID) | ORAL | 0 refills | Status: DC | PRN

## 2018-02-27 MED ORDER — TIZANIDINE HCL 4 MG PO TABS: ORAL_TABLET | ORAL | 3 refills | Status: DC

## 2018-02-27 NOTE — Patient Instructions (Signed)

## 2018-02-27 NOTE — Progress Notes (Signed)
Subjective:    Patient ID: Karen Hopkins, female    DOB: 06/17/1975, 42 y.o.   MRN: [id number] botox 01/12/18 Right Trapezius, 75 Right levator, 75 units Right sternocleidomastoid, 75units Right scalene 37.5 units ant 37.5 middle HPI Some wheezing and coughing recently  Some left ankle swelling that has improved, does not recall injury Starting some ankel exercises, balance and stability  Reviewed meds pt didn't know she had refill of Amitiza  Takes tizanidine TID for Right shoulder Botox has been helpful however still has pain in the right upper trapezius.  We discussed dosing options she is on a fairly high dose already and there is no guarantee that higher dosing would give her any better effect.  Also it may cause additional weakness in the shoulder girdle musculature. Pain Inventory Average Pain 7 Pain Right Now 7 My pain is sharp, burning, dull, stabbing, tingling and aching  In the last 24 hours, has pain interfered with the following? General activity 7 Relation with others 8 Enjoyment of life 8 What TIME of day is your pain at its worst? night Sleep (in general) Poor  Pain is worse with: walking, bending, standing and some activites Pain improves with: rest and therapy/exercise Relief from Meds: .  Mobility use a cane ability to climb steps?  no do you drive?  yes  Function disabled: date disabled .  Neuro/Psych bladder control problems numbness tingling trouble walking spasms dizziness confusion depression anxiety  Prior Studies Any changes since last visit?  no  Physicians involved in your care Any changes since last visit?  no   Family History  Problem Relation Age of Onset  . Hypertension Mother   . Breast cancer Mother 38  . Diabetes type II Father   . Prostate cancer Father   . Stroke Neg Hx    Social History   Socioeconomic History  . Marital status: Single    Spouse name: Not on file  . Number of children: 3  . Years of  education: 85  . Highest education level: Not on file  Occupational History  . Occupation: Disability   Social Needs  . Financial resource strain: Not on file  . Food insecurity:    Worry: Not on file    Inability: Not on file  . Transportation needs:    Medical: Not on file    Non-medical: Not on file  Tobacco Use  . Smoking status: Former Smoker    Packs/day: 0.25    Years: 5.00    Pack years: 1.25    Types: Cigarettes    Start date: 06/07/2002    Last attempt to quit: 04/26/2010    Years since quitting: 7.8  . Smokeless tobacco: Never Used  Substance and Sexual Activity  . Alcohol use: No  . Drug use: No  . Sexual activity: Not Currently    Birth control/protection: Surgical  Lifestyle  . Physical activity:    Days per week: Not on file    Minutes per session: Not on file  . Stress: Not on file  Relationships  . Social connections:    Talks on phone: Not on file    Gets together: Not on file    Attends religious service: Not on file    Active member of club or organization: Not on file    Attends meetings of clubs or organizations: Not on file    Relationship status: Not on file  Other Topics Concern  . Not on file  Social History  Narrative   Lives with kids   Caffeine use: none    Past Surgical History:  Procedure Laterality Date  . BREAST BIOPSY Right 08/26/2009  . BREAST REDUCTION SURGERY Left 08/23/2013   Procedure: LEFT BREAST REDUCTION  ;  Surgeon: Theodoro Kos, DO;  Location: [place];  Service: Plastics;  Laterality: Left;  . CESAREAN SECTION  07/15/2001; 03/18/2004; 11/21/2008  . lateral orbiotomy Left 11/2015   Dr. Toy Cookey at [facility]  . LATISSIMUS FLAP TO BREAST Right 03/12/2013   Procedure: RIGHT BREAST LATISSIMUS FLAP WITH EXPANDER PLACEMENT;  Surgeon: Theodoro Kos, DO;  Location: [place];  Service: Plastics;  Laterality: Right;  . LIPOSUCTION Bilateral 08/23/2013   Procedure: LIPOSUCTION;  Surgeon: Theodoro Kos, DO;  Location: [place];  Service: Plastics;  Laterality: Bilateral;  . MASTECTOMY Right 2011  . MODIFIED RADICAL MASTECTOMY Right 03/11/2010  . NASAL SEPTUM SURGERY    . PORT-A-CATH REMOVAL Left 12/01/2010  . PORTACATH PLACEMENT Left 09/12/2009  . REDUCTION MAMMAPLASTY Left 08/2013  . REMOVAL OF TISSUE EXPANDER AND PLACEMENT OF IMPLANT Right 08/23/2013   Procedure: REMOVAL RIGHT TISSUE EXPANDER AND PLACEMENT OF IMPLANT TO RIGHT BREAST ;  Surgeon: Theodoro Kos, DO;  Location: [place];  Service: Plastics;  Laterality: Right;  . SUPRA-UMBILICAL HERNIA  7106  . TUBAL LIGATION  11/21/2008   Past Medical History:  Diagnosis Date  . Anxiety   . Cancer ([place]) 2011   Rt. Br. Ca  . Depression   . Essential hypertension 11/05/2015  . History of breast cancer 2011   right  . History of chemotherapy 2011  . History of radiation therapy 2011  . Hypertension    under control with med., has been on med. x 2 yr.  . Lymphedema of arm    right; no BP or puncture to right arm  . Personal history of chemotherapy 09/16/2009   rt breast  . Personal history of radiation therapy 05/2010   rt breast  . Pneumonia   . Seasonal allergies   . Sinus headache    LMP 07/22/2014 (Approximate)   Opioid Risk Score:   Fall Risk Score:  `1  Depression screen PHQ 2/9  Depression screen [facility] 2/9 01/26/2018 12/19/2017 08/23/2017 08/31/2016 07/22/2016 07/20/2016 07/08/2016  Decreased Interest 3 3 1  0 0 0 0  Down, Depressed, Hopeless 3 3 1  0 0 0 0  PHQ - 2 Score 6 6 2  0 0 0 0  Some recent data might be hidden     Review of Systems  Constitutional: Positive for unexpected weight change.  HENT: Negative.   Eyes: Negative.   Respiratory: Positive for cough and wheezing.   Cardiovascular: Negative.   Gastrointestinal: Positive for constipation.  Endocrine: Negative.   Genitourinary: Negative.   Musculoskeletal: Positive for arthralgias, back pain, gait problem and myalgias.  Allergic/Immunologic: Negative.     Neurological: Positive for dizziness and numbness.  Hematological: Negative.   Psychiatric/Behavioral: Positive for confusion and dysphoric mood. The patient is nervous/anxious.   All other systems reviewed and are negative.      Objective:   Physical Exam  Constitutional: She appears well-developed and well-nourished. No distress.  HENT:  Head: Normocephalic and atraumatic.  Eyes: Pupils are equal, round, and reactive to light. EOM are normal.  Neck:  Patient has mildly diminished range of motion at the cervical spine she has full flexion 75% extension 50% lateral bending and rotation  Skin: She is not diaphoretic.  Nursing note and  vitals reviewed.   Patient has decreased range of motion with internal and external rotation of the right shoulder.  In addition she has some tightness feeling in the anterior shoulder with scapular retraction bilaterally. Patient has mild tenderness to palpation in the right trapezius no tenderness along the levator no tenderness along the scalenes. Right upper extremity strength 4/5 in the deltoid bicep tricep grip 5/5 on the left side       Assessment & Plan:   1.  Cervical dystonia she has history of chronic lymphedema right upper extremity.  She has fair to good relief of her symptoms with botulinum toxin injections.  At this point I would not recommend a higher dose mainly with concerns about excessive weakening of the shoulder girdle musculature. 2.  Right shoulder contracture she would benefit from stretching internal and external rotators.  In addition I have recommended scapular retraction exercises and have demonstrated technique.  She does have Therabands that she can use at home Indication for chronic opioid: neoplasm related pain Medication and dose: oxycodone 5mg  QID # pills per month: 120 Last UDS date: 12/19/17 Opioid Treatment Agreement signed (Y/N): y Opioid Treatment Agreement last reviewed with patient:  01/26/18 [facility] reviewed  this encounter (include red flags):  12/19/17- RED FLAGS benzos Rx with psych and oncology

## 2018-02-28 ENCOUNTER — Ambulatory Visit: Payer: Medicare Other | Admitting: Physical Therapy

## 2018-02-28 ENCOUNTER — Telehealth: Payer: Self-pay | Admitting: Physical Therapy

## 2018-02-28 ENCOUNTER — Ambulatory Visit: Payer: Medicare Other

## 2018-02-28 NOTE — Telephone Encounter (Signed)
Zakirah did not show up for her appointment today. Left a message for her to call us,  Maudry Diego, PT @TODAY @ 9:20 PM

## 2018-03-01 ENCOUNTER — Ambulatory Visit: Payer: Medicare Other | Admitting: Physical Therapy

## 2018-03-02 ENCOUNTER — Ambulatory Visit: Payer: Medicare Other | Admitting: Rehabilitation

## 2018-03-02 ENCOUNTER — Telehealth: Payer: Self-pay | Admitting: Rehabilitation

## 2018-03-02 NOTE — Telephone Encounter (Signed)
Called pt to let her know we would cancel the rest of her appointments if we did not hear back from her by the end of 03/03/18 due to 2 no shows in a row

## 2018-03-06 ENCOUNTER — Ambulatory Visit: Payer: Medicare Other | Admitting: Physical Therapy

## 2018-03-07 ENCOUNTER — Ambulatory Visit: Payer: Medicare Other | Attending: Oncology

## 2018-03-07 ENCOUNTER — Telehealth: Payer: Self-pay

## 2018-03-07 ENCOUNTER — Ambulatory Visit: Payer: Medicare Other

## 2018-03-07 NOTE — Telephone Encounter (Signed)
Called and left message for Karen Hopkins regarding missed appointment (s).  As this is her third consecutive "no show" we will D/C her at this time and cancel all future appointments.

## 2018-03-08 ENCOUNTER — Ambulatory Visit: Payer: Medicare Other | Admitting: Physical Therapy

## 2018-03-09 ENCOUNTER — Encounter: Payer: Medicare Other | Admitting: Physical Therapy

## 2018-03-10 ENCOUNTER — Inpatient Hospital Stay: Payer: Medicare Other | Attending: Oncology

## 2018-03-10 DIAGNOSIS — Z5111 Encounter for antineoplastic chemotherapy: Secondary | ICD-10-CM | POA: Insufficient documentation

## 2018-03-10 DIAGNOSIS — C50011 Malignant neoplasm of nipple and areola, right female breast: Secondary | ICD-10-CM | POA: Insufficient documentation

## 2018-03-10 DIAGNOSIS — Z170 Estrogen receptor positive status [ER+]: Secondary | ICD-10-CM | POA: Insufficient documentation

## 2018-03-10 MED ORDER — GOSERELIN ACETATE 3.6 MG ~~LOC~~ IMPL
DRUG_IMPLANT | SUBCUTANEOUS | Status: AC
  Filled 2018-03-10: qty 3.6

## 2018-03-13 ENCOUNTER — Ambulatory Visit: Payer: Medicare Other | Admitting: Physical Therapy

## 2018-03-15 ENCOUNTER — Ambulatory Visit: Payer: Medicare Other | Admitting: Physical Therapy

## 2018-03-16 ENCOUNTER — Telehealth: Payer: Self-pay

## 2018-03-16 NOTE — Telephone Encounter (Signed)
Called and patient phone hung up. Return call and received a voice mail. Left a voice message for patient to return call if she needed her appointment with [facility] r/s.

## 2018-03-20 ENCOUNTER — Ambulatory Visit: Payer: Medicare Other | Admitting: Physical Therapy

## 2018-03-22 ENCOUNTER — Ambulatory Visit: Payer: Medicare Other | Admitting: Physical Therapy

## 2018-03-23 ENCOUNTER — Telehealth: Payer: Self-pay | Admitting: Oncology

## 2018-03-23 NOTE — Telephone Encounter (Signed)
Patient called to reschedule  °

## 2018-03-23 NOTE — Telephone Encounter (Signed)
Appts adjusted and LMVM for patient per 10/17 sch msg

## 2018-03-24 ENCOUNTER — Other Ambulatory Visit: Payer: Self-pay | Admitting: Neurology

## 2018-03-24 ENCOUNTER — Inpatient Hospital Stay: Payer: Medicare Other

## 2018-03-24 DIAGNOSIS — Z170 Estrogen receptor positive status [ER+]: Secondary | ICD-10-CM | POA: Diagnosis not present

## 2018-03-24 DIAGNOSIS — C50011 Malignant neoplasm of nipple and areola, right female breast: Secondary | ICD-10-CM

## 2018-03-24 DIAGNOSIS — Z5111 Encounter for antineoplastic chemotherapy: Secondary | ICD-10-CM | POA: Diagnosis not present

## 2018-03-24 MED ORDER — GOSERELIN ACETATE 3.6 MG ~~LOC~~ IMPL
3.6000 mg | DRUG_IMPLANT | Freq: Once | SUBCUTANEOUS | Status: AC
  Administered 2018-03-24: 3.6 mg via SUBCUTANEOUS

## 2018-03-24 MED ORDER — GOSERELIN ACETATE 3.6 MG ~~LOC~~ IMPL
DRUG_IMPLANT | SUBCUTANEOUS | Status: AC
  Filled 2018-03-24: qty 3.6

## 2018-03-27 ENCOUNTER — Ambulatory Visit: Payer: Medicare Other | Admitting: Physical Therapy

## 2018-03-27 ENCOUNTER — Encounter: Payer: Medicare Other | Attending: Registered Nurse | Admitting: Registered Nurse

## 2018-03-27 DIAGNOSIS — I890 Lymphedema, not elsewhere classified: Secondary | ICD-10-CM | POA: Insufficient documentation

## 2018-03-27 DIAGNOSIS — M546 Pain in thoracic spine: Secondary | ICD-10-CM | POA: Insufficient documentation

## 2018-03-28 ENCOUNTER — Ambulatory Visit: Payer: Medicare Other | Admitting: Oncology

## 2018-03-28 ENCOUNTER — Ambulatory Visit: Payer: Medicare Other

## 2018-03-28 ENCOUNTER — Other Ambulatory Visit: Payer: Medicare Other

## 2018-03-28 ENCOUNTER — Encounter: Payer: Self-pay | Admitting: Registered Nurse

## 2018-03-28 ENCOUNTER — Encounter (HOSPITAL_BASED_OUTPATIENT_CLINIC_OR_DEPARTMENT_OTHER): Payer: Medicare Other | Admitting: Registered Nurse

## 2018-03-28 VITALS — BP 111/78 | HR 78 | Ht 64.0 in | Wt 242.2 lb

## 2018-03-28 DIAGNOSIS — M792 Neuralgia and neuritis, unspecified: Secondary | ICD-10-CM

## 2018-03-28 DIAGNOSIS — Z5181 Encounter for therapeutic drug level monitoring: Secondary | ICD-10-CM

## 2018-03-28 DIAGNOSIS — G243 Spasmodic torticollis: Secondary | ICD-10-CM | POA: Diagnosis not present

## 2018-03-28 DIAGNOSIS — M542 Cervicalgia: Secondary | ICD-10-CM

## 2018-03-28 DIAGNOSIS — F329 Major depressive disorder, single episode, unspecified: Secondary | ICD-10-CM

## 2018-03-28 DIAGNOSIS — M546 Pain in thoracic spine: Secondary | ICD-10-CM | POA: Diagnosis not present

## 2018-03-28 DIAGNOSIS — G893 Neoplasm related pain (acute) (chronic): Secondary | ICD-10-CM | POA: Diagnosis not present

## 2018-03-28 DIAGNOSIS — M5416 Radiculopathy, lumbar region: Secondary | ICD-10-CM

## 2018-03-28 DIAGNOSIS — I890 Lymphedema, not elsewhere classified: Secondary | ICD-10-CM

## 2018-03-28 DIAGNOSIS — G894 Chronic pain syndrome: Secondary | ICD-10-CM

## 2018-03-28 DIAGNOSIS — M5412 Radiculopathy, cervical region: Secondary | ICD-10-CM | POA: Diagnosis not present

## 2018-03-28 DIAGNOSIS — R413 Other amnesia: Secondary | ICD-10-CM

## 2018-03-28 DIAGNOSIS — Z79899 Other long term (current) drug therapy: Secondary | ICD-10-CM

## 2018-03-28 DIAGNOSIS — M170 Bilateral primary osteoarthritis of knee: Secondary | ICD-10-CM

## 2018-03-28 MED ORDER — OXYCODONE HCL 5 MG PO TABS: 5.0000 mg | ORAL_TABLET | Freq: Four times a day (QID) | ORAL | 0 refills | Status: DC | PRN

## 2018-03-28 NOTE — Patient Instructions (Addendum)
Call Your Neurologist Dr. Jaynee Eagles for Memory Changes :   Karen Ill, MD  [facility] [address] [place], [place] [address]  Phone [phone] Fax [phone]  Put in your Phone all your Schedule appointments for reminders  appointments

## 2018-03-28 NOTE — Progress Notes (Signed)
Subjective:    Patient ID: Karen Hopkins, female    DOB: November 25, 1975, 42 y.o.   MRN: [id number]  HPI: Karen Hopkins is a 42 year old female who is here for follow up appointment for chronic pain and medication refill. She states her pain is located in her neck radiating into her right shoulder also reports left shoulder pain, upper and lower back pain radiating into her right lower extremity, she admits she ran out of her gabapentin, she didn't call office. Educated on medication compliance and bilateral knee pain. Also reports generalized pain all over.  She rates her pain 8. Her current exercise regime is walking and performing stretching exercises.   Karen Hopkins states her aunt passed away and it only been 5 months since her mother passed away, very tearful. Emotional support given. She admits to depression and denies suicidal ideation. She's going to call her counselor at [place] [facility] today, she states.   Karen Hopkins also reports memory changes, she will schedule appointment with her neurologist. Educated on Cognitive exercises such as using calendars to assist with her keeping with appointments, she verbalizes understanding. Also states she experienced this in the past and was told it was related to her chemotherapy.   Karen Hopkins is 30.00 MME. She is also prescribed Alprazolam  by Lorelle Gibbs NP. We have discussed the black box warning of using opioids and benzodiazepines. I highlighted the dangers of using these drugs together and discussed the adverse events including respiratory suppression, overdose, cognitive impairment and importance of compliance with current regimen. We will continue to monitor and adjust as indicated.   She is being closely monitored and under the care of her psychiatrist at the [facility].   Last UDS was Performed on 12/19/2017, it was consistent.    Pain Inventory Average Pain 8 Pain Right Now 8 My pain is intermittent, constant,  sharp, burning, dull, stabbing, tingling and aching  In the last 24 hours, has pain interfered with the following? General activity 4 Relation with others 4 Enjoyment of life 4 What TIME of day is your pain at its worst? all Sleep (in general) Poor  Pain is worse with: walking, bending and inactivity Pain improves with: rest, therapy/exercise, medication, TENS and injections Relief from Meds: na  Mobility walk without assistance use a cane ability to climb steps?  no do you drive?  yes  Function I need assistance with the following:  meal prep, household duties and shopping  Neuro/Psych weakness numbness tingling trouble walking spasms dizziness confusion depression anxiety  Prior Studies Any changes since last visit?  no  Physicians involved in your care Any changes since last visit?  no   Family History  Problem Relation Age of Onset  . Hypertension Mother   . Breast cancer Mother 69  . Diabetes type II Father   . Prostate cancer Father   . Stroke Neg Hx    Social History   Socioeconomic History  . Marital status: Single    Spouse name: Not on file  . Number of children: 3  . Years of education: 60  . Highest education level: Not on file  Occupational History  . Occupation: Disability   Social Needs  . Financial resource strain: Not on file  . Food insecurity:    Worry: Not on file    Inability: Not on file  . Transportation needs:    Medical: Not on file    Non-medical: Not on  file  Tobacco Use  . Smoking status: Former Smoker    Packs/day: 0.25    Years: 5.00    Pack years: 1.25    Types: Cigarettes    Start date: 06/07/2002    Last attempt to quit: 04/26/2010    Years since quitting: 7.9  . Smokeless tobacco: Never Used  Substance and Sexual Activity  . Alcohol use: No  . Drug use: No  . Sexual activity: Not Currently    Birth control/protection: Surgical  Lifestyle  . Physical activity:    Days per week: Not on file    Minutes  per session: Not on file  . Stress: Not on file  Relationships  . Social connections:    Talks on phone: Not on file    Gets together: Not on file    Attends religious service: Not on file    Active member of club or organization: Not on file    Attends meetings of clubs or organizations: Not on file    Relationship status: Not on file  Other Topics Concern  . Not on file  Social History Narrative   Lives with kids   Caffeine use: none    Past Surgical History:  Procedure Laterality Date  . BREAST BIOPSY Right 08/26/2009  . BREAST REDUCTION SURGERY Left 08/23/2013   Procedure: LEFT BREAST REDUCTION  ;  Surgeon: Theodoro Kos, DO;  Location: [place];  Service: Plastics;  Laterality: Left;  . CESAREAN SECTION  07/15/2001; 03/18/2004; 11/21/2008  . lateral orbiotomy Left 11/2015   Dr. Toy Cookey at [facility]  . LATISSIMUS FLAP TO BREAST Right 03/12/2013   Procedure: RIGHT BREAST LATISSIMUS FLAP WITH EXPANDER PLACEMENT;  Surgeon: Theodoro Kos, DO;  Location: [place];  Service: Plastics;  Laterality: Right;  . LIPOSUCTION Bilateral 08/23/2013   Procedure: LIPOSUCTION;  Surgeon: Theodoro Kos, DO;  Location: [place];  Service: Plastics;  Laterality: Bilateral;  . MASTECTOMY Right 2011  . MODIFIED RADICAL MASTECTOMY Right 03/11/2010  . NASAL SEPTUM SURGERY    . PORT-A-CATH REMOVAL Left 12/01/2010  . PORTACATH PLACEMENT Left 09/12/2009  . REDUCTION MAMMAPLASTY Left 08/2013  . REMOVAL OF TISSUE EXPANDER AND PLACEMENT OF IMPLANT Right 08/23/2013   Procedure: REMOVAL RIGHT TISSUE EXPANDER AND PLACEMENT OF IMPLANT TO RIGHT BREAST ;  Surgeon: Theodoro Kos, DO;  Location: [place];  Service: Plastics;  Laterality: Right;  . SUPRA-UMBILICAL HERNIA  5621  . TUBAL LIGATION  11/21/2008   Past Medical History:  Diagnosis Date  . Anxiety   . Cancer ([place]) 2011   Rt. Br. Ca  . Depression   . Essential hypertension 11/05/2015  . History of breast cancer 2011    right  . History of chemotherapy 2011  . History of radiation therapy 2011  . Hypertension    under control with med., has been on med. x 2 yr.  . Lymphedema of arm    right; no BP or puncture to right arm  . Personal history of chemotherapy 09/16/2009   rt breast  . Personal history of radiation therapy 05/2010   rt breast  . Pneumonia   . Seasonal allergies   . Sinus headache    LMP 07/22/2014 (Approximate)   Opioid Risk Score:   Fall Risk Score:  `1  Depression screen PHQ 2/9  Depression screen [facility] 2/9 01/26/2018 12/19/2017 08/23/2017 08/31/2016 07/22/2016 07/20/2016 07/08/2016  Decreased Interest 3 3 1  0 0 0 0  Down, Depressed, Hopeless 3 3 1  0  0 0 0  PHQ - 2 Score 6 6 2  0 0 0 0  Some recent data might be hidden     Review of Systems  Constitutional: Negative.   HENT: Negative.   Eyes: Negative.   Respiratory: Negative.   Cardiovascular: Negative.   Gastrointestinal: Negative.   Endocrine: Negative.   Genitourinary: Negative.   Musculoskeletal: Positive for arthralgias, back pain, gait problem, joint swelling and myalgias.  Skin: Negative.   Allergic/Immunologic: Negative.   Neurological: Positive for dizziness, weakness and numbness.  Hematological: Negative.   Psychiatric/Behavioral: Positive for confusion and dysphoric mood. The patient is nervous/anxious.   All other systems reviewed and are negative.      Objective:   Physical Exam  Constitutional: She is oriented to [place], place, and time. She appears well-developed and well-nourished.  HENT:  Head: Normocephalic and atraumatic.  Neck: Normal range of motion. Neck supple.  Cervical Paraspinal Tenderness: C-5-C-6  Cardiovascular: Normal rate and regular rhythm.  Pulmonary/Chest: Effort normal and breath sounds normal.  Musculoskeletal:  Normal Muscle Bulk and Muscle Testing Reveals: Upper Extremities: Full ROM and Muscle Strength 5/5 Right: Decreased ROM 45 Degrees and Muscle Strength 4/5 Left: Full ROM  and Muscle Strength 5/5 Lumbar Paraspinal Tenderness: L-3-L-5 Lower Extremities: Full ROM and Muscle Strength 5/5 Right Lower Extremity Flexion Produces Pain into Right Lower Extremity Arises from Table with Ease Narrow Based gait  Neurological: She is alert and oriented to [place], place, and time.  Skin: Skin is warm and dry.  Psychiatric: She has a normal mood and affect. Her behavior is normal.  Nursing note and vitals reviewed.         Assessment & Plan:  1.Myofascial pain syndrome chronic postoperative, as well as post radiation: S/P Breast Reconstruction Surgery x 2. Continueto Monitor.03/28/2018.Continue current medication regime: Refilled: Oxycodone 5 mg one tablet every 6 hours as needed for severe pain #120.Continue HEP. We will continue the opioid monitoring program, this consists of regular clinic visits, examinations, urine drug screen, pill counts as well as use of [place] Controlled Substance Reporting System. 2. Neoplasm Related Pain/ Cervical radiculitis: Refilled: Gabapentin, educated on medication compliance. Continue to Monitor. 03/28/2018 3. Lymphedema of Right Upper Extremity: Oncology Following. 03/28/2018 4.Cervicalgia/Cervical Dystonia: Scheduled for Botox on 04/10/2018. 03/28/2018 5. Obesity: Continue with Healthy Diet and Exercise Regime. 03/28/2018 6. Muscle Spasms: Continuecurrent medication regimeTizanidine. 03/28/2018 7. Depression: Continue Cymbalta and Counseling at [place]. 03/28/2018 8.Chronic Bilateral Knee Pai/ OA: Continue VoltarenGel. 03/28/2018 9.Chronic Midline/ Low Back Bain/ Bilateral Low Back Pain: Continue with HEP as Tolerated. Continue current medication regimen.03/28/2018 10. Neuropathic Pain:ContinueGabapentin:03/28/2018 11. Opioid Induced Constipation: Movantik not on her Drug Formulary: Continue: Amitiza. 03/28/2018. 12. Bilateral Greater Trochanter Bursitis: No complaints today. Continue HEP and Ice Therapy  as needed. 03/28/2018. 13. Bilateral Knee Pain: Continue HEP as Tolerated. Continue to Monitor. 03/28/2018. 14. Memory Changes> Karen Hopkins states she will call her Neurologist today, number given.   20 minutes of face to face patient care time was spent during this visit. All questions were encouraged and answered.   F/U in 1 month

## 2018-03-29 ENCOUNTER — Ambulatory Visit: Payer: Medicare Other | Admitting: Physical Therapy

## 2018-04-03 ENCOUNTER — Ambulatory Visit: Payer: Medicare Other

## 2018-04-06 ENCOUNTER — Inpatient Hospital Stay: Payer: Medicare Other | Admitting: Oncology

## 2018-04-06 ENCOUNTER — Ambulatory Visit: Payer: Medicare Other

## 2018-04-06 ENCOUNTER — Inpatient Hospital Stay: Payer: Medicare Other

## 2018-04-10 ENCOUNTER — Other Ambulatory Visit: Payer: Self-pay

## 2018-04-10 ENCOUNTER — Ambulatory Visit (HOSPITAL_BASED_OUTPATIENT_CLINIC_OR_DEPARTMENT_OTHER): Payer: Medicare Other | Admitting: Physical Medicine & Rehabilitation

## 2018-04-10 ENCOUNTER — Encounter: Payer: Self-pay | Admitting: Physical Medicine & Rehabilitation

## 2018-04-10 VITALS — BP 132/86 | HR 83 | Ht 64.0 in | Wt 230.0 lb

## 2018-04-10 DIAGNOSIS — M546 Pain in thoracic spine: Secondary | ICD-10-CM | POA: Diagnosis not present

## 2018-04-10 DIAGNOSIS — I890 Lymphedema, not elsewhere classified: Secondary | ICD-10-CM | POA: Diagnosis not present

## 2018-04-10 DIAGNOSIS — G243 Spasmodic torticollis: Secondary | ICD-10-CM | POA: Diagnosis not present

## 2018-04-10 NOTE — Progress Notes (Signed)
Botox  Injection for cervical dystonia using needle EMG guidance  Dilution:  50Units/ml Indication: Severe spasticity which interferes with ADL,mobility and/or  hygiene and is unresponsive to medication management and other conservative care Informed consent was obtained after describing risks and benefits of the procedure with the patient. This includes bleeding, bruising, infection, excessive weakness, or medication side effects. A REMS form is on file and signed. Needle:  needle electrode Number of units per muscle Right Trapezius, 75 Right levator, 50 units Splenius cap 25 Right sternocleidomastoid, 75units Right scalene 25 units ant 25 middle  All injections were done after obtaining appropriate EMG activity Based on EMG consider increase splenius cap and reduce SCM to 25U upper aspect only and after negative drawback for blood. The patient tolerated the procedure well. Post procedure instructions were given. A followup appointment was made.

## 2018-04-10 NOTE — Patient Instructions (Signed)

## 2018-04-14 ENCOUNTER — Inpatient Hospital Stay: Payer: Medicare Other

## 2018-04-14 ENCOUNTER — Telehealth: Payer: Self-pay | Admitting: Oncology

## 2018-04-14 NOTE — Telephone Encounter (Signed)
R/s appt per 11/8 sch message - pt is aware of date and time

## 2018-04-17 ENCOUNTER — Inpatient Hospital Stay: Payer: Medicare Other | Attending: Oncology

## 2018-04-17 DIAGNOSIS — C50011 Malignant neoplasm of nipple and areola, right female breast: Secondary | ICD-10-CM | POA: Diagnosis not present

## 2018-04-17 DIAGNOSIS — Z170 Estrogen receptor positive status [ER+]: Secondary | ICD-10-CM | POA: Insufficient documentation

## 2018-04-17 DIAGNOSIS — Z5111 Encounter for antineoplastic chemotherapy: Secondary | ICD-10-CM | POA: Diagnosis not present

## 2018-04-17 MED ORDER — GOSERELIN ACETATE 3.6 MG ~~LOC~~ IMPL
DRUG_IMPLANT | SUBCUTANEOUS | Status: AC
  Filled 2018-04-17: qty 3.6

## 2018-04-17 MED ORDER — GOSERELIN ACETATE 3.6 MG ~~LOC~~ IMPL
3.6000 mg | DRUG_IMPLANT | Freq: Once | SUBCUTANEOUS | Status: AC
  Administered 2018-04-17: 3.6 mg via SUBCUTANEOUS

## 2018-04-24 ENCOUNTER — Ambulatory Visit: Payer: Medicare Other

## 2018-04-27 ENCOUNTER — Encounter: Payer: Medicare Other | Attending: Registered Nurse | Admitting: Registered Nurse

## 2018-04-27 ENCOUNTER — Encounter: Payer: Self-pay | Admitting: Registered Nurse

## 2018-04-27 ENCOUNTER — Other Ambulatory Visit: Payer: Self-pay

## 2018-04-27 VITALS — BP 119/81 | HR 100 | Ht 64.0 in | Wt 222.0 lb

## 2018-04-27 DIAGNOSIS — M542 Cervicalgia: Secondary | ICD-10-CM | POA: Diagnosis not present

## 2018-04-27 DIAGNOSIS — M62838 Other muscle spasm: Secondary | ICD-10-CM

## 2018-04-27 DIAGNOSIS — Z79899 Other long term (current) drug therapy: Secondary | ICD-10-CM

## 2018-04-27 DIAGNOSIS — G894 Chronic pain syndrome: Secondary | ICD-10-CM

## 2018-04-27 DIAGNOSIS — I890 Lymphedema, not elsewhere classified: Secondary | ICD-10-CM | POA: Diagnosis not present

## 2018-04-27 DIAGNOSIS — G893 Neoplasm related pain (acute) (chronic): Secondary | ICD-10-CM | POA: Diagnosis not present

## 2018-04-27 DIAGNOSIS — Z5181 Encounter for therapeutic drug level monitoring: Secondary | ICD-10-CM

## 2018-04-27 DIAGNOSIS — M5412 Radiculopathy, cervical region: Secondary | ICD-10-CM

## 2018-04-27 DIAGNOSIS — M5416 Radiculopathy, lumbar region: Secondary | ICD-10-CM

## 2018-04-27 DIAGNOSIS — M546 Pain in thoracic spine: Secondary | ICD-10-CM | POA: Diagnosis not present

## 2018-04-27 DIAGNOSIS — F329 Major depressive disorder, single episode, unspecified: Secondary | ICD-10-CM

## 2018-04-27 MED ORDER — OXYCODONE HCL 5 MG PO TABS: 5.0000 mg | ORAL_TABLET | Freq: Four times a day (QID) | ORAL | 0 refills | Status: DC | PRN

## 2018-04-27 NOTE — Progress Notes (Signed)
Subjective:    Patient ID: Karen Hopkins, female    DOB: 10/20/1975, 42 y.o.   MRN: [id number]  HPI: [name] is a 42 year old female who returns for a follow-up appointment for chronic pain and medication refill.  She states her pain is located on her neck mainly the right side radiating into her right shoulder and lower back pain radiating into her right lower extremity.  She rates her pain 8.  Her current exercise regime is walking.   Ms. is [place]auth Morphine Equivalent is 30.00 MME. she is also prescribed alprazolam by Lorelle Gibbs NP.  We have discussed the black box warning of using opioids and benzodiazepines.  I highlighted the dangers of using these drugs together and discussed the adverse events including respiratory suppression, overdose, cognitive impairment and importance of compliance with current regimen we will continue to monitor and adjust as indicated.   She is being closely monitored and under the care of her Psychiatrist at [facility].   Last UDS was Performed on 12/19/2017, it was consistent.   Pain Inventory Average Pain 7 Pain Right Now 8 My pain is sharp, burning, dull, stabbing, tingling and aching  In the last 24 hours, has pain interfered with the following? General activity 6 Relation with others 6 Enjoyment of life 7 What TIME of day is your pain at its worst? all Sleep (in general) Poor  Pain is worse with: walking, bending, sitting, standing and some activites Pain improves with: rest, therapy/exercise, medication, TENS and injections Relief from Meds: 6  Mobility walk with assistance use a cane how many minutes can you walk? 10 ability to climb steps?  no do you drive?  yes  Function disabled: date disabled n/a  Neuro/Psych numbness tingling spasms depression anxiety  Prior Studies Any changes since last visit?  no  Physicians involved in your care Any changes since last visit?  no   Family History  Problem Relation  Age of Onset  . Hypertension Mother   . Breast cancer Mother 78  . Diabetes type II Father   . Prostate cancer Father   . Stroke Neg Hx    Social History   Socioeconomic History  . Marital status: Single    Spouse name: Not on file  . Number of children: 3  . Years of education: 55  . Highest education level: Not on file  Occupational History  . Occupation: Disability   Social Needs  . Financial resource strain: Not on file  . Food insecurity:    Worry: Not on file    Inability: Not on file  . Transportation needs:    Medical: Not on file    Non-medical: Not on file  Tobacco Use  . Smoking status: Former Smoker    Packs/day: 0.25    Years: 5.00    Pack years: 1.25    Types: Cigarettes    Start date: 06/07/2002    Last attempt to quit: 04/26/2010    Years since quitting: 8.0  . Smokeless tobacco: Never Used  Substance and Sexual Activity  . Alcohol use: No  . Drug use: No  . Sexual activity: Not Currently    Birth control/protection: Surgical  Lifestyle  . Physical activity:    Days per week: Not on file    Minutes per session: Not on file  . Stress: Not on file  Relationships  . Social connections:    Talks on phone: Not on file    Gets  together: Not on file    Attends religious service: Not on file    Active member of club or organization: Not on file    Attends meetings of clubs or organizations: Not on file    Relationship status: Not on file  Other Topics Concern  . Not on file  Social History Narrative   Lives with kids   Caffeine use: none    Past Surgical History:  Procedure Laterality Date  . BREAST BIOPSY Right 08/26/2009  . BREAST REDUCTION SURGERY Left 08/23/2013   Procedure: LEFT BREAST REDUCTION  ;  Surgeon: Theodoro Kos, DO;  Location: [place];  Service: Plastics;  Laterality: Left;  . CESAREAN SECTION  07/15/2001; 03/18/2004; 11/21/2008  . lateral orbiotomy Left 11/2015   Dr. Toy Cookey at [facility]  . LATISSIMUS FLAP TO BREAST  Right 03/12/2013   Procedure: RIGHT BREAST LATISSIMUS FLAP WITH EXPANDER PLACEMENT;  Surgeon: Theodoro Kos, DO;  Location: [place];  Service: Plastics;  Laterality: Right;  . LIPOSUCTION Bilateral 08/23/2013   Procedure: LIPOSUCTION;  Surgeon: Theodoro Kos, DO;  Location: [place];  Service: Plastics;  Laterality: Bilateral;  . MASTECTOMY Right 2011  . MODIFIED RADICAL MASTECTOMY Right 03/11/2010  . NASAL SEPTUM SURGERY    . PORT-A-CATH REMOVAL Left 12/01/2010  . PORTACATH PLACEMENT Left 09/12/2009  . REDUCTION MAMMAPLASTY Left 08/2013  . REMOVAL OF TISSUE EXPANDER AND PLACEMENT OF IMPLANT Right 08/23/2013   Procedure: REMOVAL RIGHT TISSUE EXPANDER AND PLACEMENT OF IMPLANT TO RIGHT BREAST ;  Surgeon: Theodoro Kos, DO;  Location: [place];  Service: Plastics;  Laterality: Right;  . SUPRA-UMBILICAL HERNIA  8099  . TUBAL LIGATION  11/21/2008   Past Medical History:  Diagnosis Date  . Anxiety   . Cancer ([place]) 2011   Rt. Br. Ca  . Depression   . Essential hypertension 11/05/2015  . History of breast cancer 2011   right  . History of chemotherapy 2011  . History of radiation therapy 2011  . Hypertension    under control with med., has been on med. x 2 yr.  . Lymphedema of arm    right; no BP or puncture to right arm  . Personal history of chemotherapy 09/16/2009   rt breast  . Personal history of radiation therapy 05/2010   rt breast  . Pneumonia   . Seasonal allergies   . Sinus headache    BP 119/81   Pulse (!) 104   Ht 5\' 4"  (1.626 m)   Wt 222 lb (100.7 kg)   LMP 07/22/2014 (Approximate)   SpO2 98%   BMI 38.11 kg/m   Opioid Risk Score:   Fall Risk Score:  `1  Depression screen PHQ 2/9  Depression screen [facility] 2/9 04/27/2018 04/10/2018 01/26/2018 12/19/2017 08/23/2017 08/31/2016 07/22/2016  Decreased Interest 1 0 3 3 1  0 0  Down, Depressed, Hopeless 1 0 3 3 1  0 0  PHQ - 2 Score 2 0 6 6 2  0 0  Some recent data might be hidden    Review of Systems   Constitutional: Negative.   HENT: Negative.   Eyes: Negative.   Respiratory: Negative.   Cardiovascular: Negative.   Gastrointestinal: Negative.   Endocrine: Negative.   Genitourinary: Positive for dyspareunia.  Musculoskeletal: Negative.   Allergic/Immunologic: Negative.   Neurological: Negative.   Hematological: Negative.   Psychiatric/Behavioral: Negative.   All other systems reviewed and are negative.      Objective:   Physical Exam  Constitutional: She  is oriented to [place], place, and time. She appears well-developed and well-nourished.  HENT:  Head: Normocephalic and atraumatic.  Neck: Normal range of motion. Neck supple.  Cervical Paraspinal Tenderness: C-4- C-6 Mainly Right Side  Cardiovascular: Normal rate.  Pulmonary/Chest: Effort normal.  Musculoskeletal:  Normal Muscle Bulk and Muscle TestingReveals:  Upper Extremities: Right: Decreased ROM 90 Degrees and Muscle Strength 4/5 Left: Full ROM and Muscle Strength 5/5 Thoracic Paraspinal Tenderness: T-1-T-6 Mainly Right Side Lumbar Paraspinal Tenderness: L-3-L-5 Lower Extremities: Full ROM and Muscle Strength 5/5 Arises from Table with Ease  Normal Based Gait   Neurological: She is alert and oriented to [place], place, and time.  Skin: Skin is warm and dry.  Psychiatric: She has a normal mood and affect.  Nursing note and vitals reviewed.         Assessment & Plan:  1.Myofascial pain syndrome chronic postoperative, as well as post radiation: S/P Breast Reconstruction Surgery x 2. Continueto Monitor.04/27/2018.Continue current medication regime: Refilled: Oxycodone 5 mg one tablet every 6 hours as needed for severe pain #120.Continue HEP. We will continue the opioid monitoring program, this consists of regular clinic visits, examinations, urine drug screen, pill counts as well as use of [place] Controlled Substance Reporting System. 2. Neoplasm Related Pain/ Cervical radiculitis: Refilled:  Gabapentin, educated on medication compliance. Continue to Monitor. 04/27/2018 3. Lymphedema of Right Upper Extremity: Oncology Following. 04/27/2018 4.Cervicalgia/Cervical Dystonia: S/P Botox on 04/10/2018. 04/27/2018 5. Obesity: Continue with Healthy Diet and Exercise Regime. 04/27/2018 6. Muscle Spasms: Continuecurrent medication regimeTizanidine. 04/27/2018 7. Depression: Continue Cymbalta and Counseling at [place]. 04/27/2018 8.Chronic Bilateral Knee Pai/ OA: Continue VoltarenGel. 04/27/2018 9.Chronic Midline/ Low Back Bain/ Bilateral Low Back Pain: Continue with HEP as Tolerated. Continue current medication regimen.04/27/2018 10. Neuropathic Pain:ContinueGabapentin:04/27/2018 11. Opioid Induced Constipation: Movantik not on her Drug Formulary: Continue: Amitiza. 04/27/2018. 12. Bilateral Greater Trochanter Bursitis: No complaints today. Continue HEP and Ice Therapy as needed. 04/27/2018. 13. Bilateral Knee Pain: Continue HEP as Tolerated. Continue to Monitor.04/27/2018.  20 minutes of face to face patient care time was spent during this visit. All questions were encouraged and answered.   F/U in 1 month

## 2018-04-28 ENCOUNTER — Encounter: Payer: Self-pay | Admitting: Obstetrics & Gynecology

## 2018-04-28 ENCOUNTER — Telehealth: Payer: Self-pay | Admitting: *Deleted

## 2018-04-28 ENCOUNTER — Other Ambulatory Visit: Payer: Self-pay

## 2018-04-28 ENCOUNTER — Ambulatory Visit: Payer: Medicare Other | Admitting: Registered Nurse

## 2018-04-28 ENCOUNTER — Ambulatory Visit (INDEPENDENT_AMBULATORY_CARE_PROVIDER_SITE_OTHER): Payer: Medicare Other | Admitting: Obstetrics & Gynecology

## 2018-04-28 VITALS — BP 124/88 | HR 100 | Ht 63.5 in | Wt 224.0 lb

## 2018-04-28 DIAGNOSIS — E119 Type 2 diabetes mellitus without complications: Secondary | ICD-10-CM

## 2018-04-28 DIAGNOSIS — Z113 Encounter for screening for infections with a predominantly sexual mode of transmission: Secondary | ICD-10-CM | POA: Diagnosis not present

## 2018-04-28 DIAGNOSIS — N898 Other specified noninflammatory disorders of vagina: Secondary | ICD-10-CM

## 2018-04-28 MED ORDER — TERCONAZOLE 0.4 % VA CREA: 1.0000 | TOPICAL_CREAM | Freq: Every day | VAGINAL | 0 refills | Status: DC

## 2018-04-28 NOTE — Telephone Encounter (Signed)
Call again to patient.  Reports vaginal itching and thick white vaginal discharge x 2 weeks. Hx Type 2 DM.  Thought it would improve after watchful waiting, but it did not.  Possible STD exposure but unsure, she is sexually active.  Would like appointment as soon as possible as now concerns regarding STD's.  No pelvic pain or fevers.  Has not tried any OTC treatment.  Office visit today with Melvia Heaps CNM.  Pt agreeable.  Encounter to [employer] CNM. Will close.   cc Dr. Sabra Heck.

## 2018-04-28 NOTE — Telephone Encounter (Signed)
Call to patient.  Message left to return call to [place] at [phone].

## 2018-04-28 NOTE — Progress Notes (Signed)
GYNECOLOGY  VISIT  CC:   Vaginal discharge and itching  HPI: 42 y.o. [id number] [name] here for STD check.  Had new sexual partner.  She is having vaginal and vulvar itching.  She is having whitish discharge and occasionally from brownish discharge.    Denies dysuria but she is having some odor change.  Feels like it smells "medicinal".    She is on Glucophage but admits she does not take this regularly.  Does not check blood sugars at home.    Has not cycled in 2 1/2 years.  Has not gotten flu shot this year.  GYNECOLOGIC HISTORY: Patient's last menstrual period was 07/22/2014 (approximate). Contraception: Hysterectomy Menopausal hormone therapy: none  Patient Active Problem List   Diagnosis Date Noted  . Lymphedema of right arm 02/27/2018  . Dyspnea 09/23/2016  . Eczema 08/31/2016  . Essential hypertension 11/05/2015  . Cervical dystonia 08/25/2015  . Headache 07/09/2015  . Myofascial muscle pain 04/29/2015  . Adhesive capsulitis of right shoulder 04/29/2015  . Type 2 diabetes mellitus without complication ([place]) 14/43/1540  . Lower extremity edema 10/01/2014  . Malignant neoplasm of overlapping sites of right breast in female, estrogen receptor positive ([place]) 05/24/2014  . Myalgia and myositis 05/17/2014  . Neoplasm related pain 03/12/2014  . CN (constipation) 11/26/2013  . H/O reduction mammoplasty 10/30/2013  . Seizure ([place]) 10/06/2013  . Status post bilateral breast implants 08/31/2013  . S/P breast reconstruction, right 08/23/2013  . AC joint arthropathy 07/02/2013  . Routine adult health maintenance 05/29/2013  . Hypokalemia 05/21/2013  . Chronic pain 04/19/2013  . Elevated blood sugar 03/09/2013  . Acquired absence of breast and absent nipple 01/30/2013  . RHINOSINUSITIS, CHRONIC 04/08/2010  . Obesity 08/04/2006  . DEPRESSIVE DISORDER, NOS 08/04/2006    Past Medical History:  Diagnosis Date  . Anxiety   . Cancer ([place]) 2011   Rt. Br. Ca  . Depression   . Essential hypertension 11/05/2015  . History of breast cancer 2011   right  . History of chemotherapy 2011  . History of radiation therapy 2011  . Hypertension    under control with med., has been on med. x 2 yr.  . Lymphedema of arm    right; no BP or puncture to right arm  . Personal history of chemotherapy 09/16/2009   rt breast  . Personal history of radiation therapy 05/2010   rt breast  . Pneumonia   . Seasonal allergies   . Sinus headache     Past Surgical History:  Procedure Laterality Date  . BREAST BIOPSY Right 08/26/2009  . BREAST REDUCTION SURGERY Left 08/23/2013   Procedure: LEFT BREAST REDUCTION  ;  Surgeon: Theodoro Kos, DO;  Location: [place];  Service: Plastics;  Laterality: Left;  . CESAREAN SECTION  07/15/2001; 03/18/2004; 11/21/2008  . lateral orbiotomy Left 11/2015   Dr. [facility] at [facility]  . LATISSIMUS FLAP TO BREAST Right 03/12/2013   Procedure: RIGHT BREAST LATISSIMUS FLAP WITH EXPANDER PLACEMENT;  Surgeon: Theodoro Kos, DO;  Location: [place];  Service: Plastics;  Laterality: Right;  . LIPOSUCTION Bilateral 08/23/2013   Procedure: LIPOSUCTION;  Surgeon: Theodoro Kos, DO;  Location: [place];  Service: Plastics;  Laterality: Bilateral;  . MASTECTOMY Right 2011  . MODIFIED RADICAL MASTECTOMY Right 03/11/2010  . NASAL SEPTUM SURGERY    . PORT-A-CATH REMOVAL Left 12/01/2010  . PORTACATH PLACEMENT Left 09/12/2009  . REDUCTION MAMMAPLASTY Left 08/2013  . REMOVAL OF  TISSUE EXPANDER AND PLACEMENT OF IMPLANT Right 08/23/2013   Procedure: REMOVAL RIGHT TISSUE EXPANDER AND PLACEMENT OF IMPLANT TO RIGHT BREAST ;  Surgeon: Theodoro Kos, DO;  Location: [place];  Service: Plastics;  Laterality: Right;  . SUPRA-UMBILICAL HERNIA  8299  . TUBAL LIGATION  11/21/2008    MEDS:   Current Outpatient Medications on File Prior to Visit  Medication Sig Dispense Refill  . albuterol (PROAIR HFA) 108 (90  Base) MCG/ACT inhaler Inhale 2 puffs into the lungs every 6 (six) hours as needed for wheezing or shortness of breath. 18 g 3  . albuterol (PROVENTIL) (2.5 MG/3ML) 0.083% nebulizer solution Take 3 mLs (2.5 mg total) by nebulization every 6 (six) hours as needed for wheezing or shortness of breath. 75 mL 3  . ALPRAZolam (XANAX XR) 2 MG 24 hr tablet Take 1 tablet by mouth as needed.  0  . ALPRAZolam (XANAX) 0.5 MG tablet   0  . AMITIZA 8 MCG capsule TAKE ONE CAPSULE BY MOUTH DAILY WITH BREAKFAST 30 capsule 1  . anastrozole (ARIMIDEX) 1 MG tablet Take 1 tablet (1 mg total) by mouth daily. 90 tablet 4  . Armodafinil 250 MG tablet   0  . buPROPion (WELLBUTRIN XL) 150 MG 24 hr tablet Take 1 tablet by mouth daily.  0  . carvedilol (COREG) 6.25 MG tablet TAKE ONE (1) TABLET BY MOUTH TWO (2) TIMES DAILY WITH A MEAL 180 tablet 0  . diclofenac sodium (VOLTAREN) 1 % GEL Apply 4 g topically 4 (four) times daily. Use as directed 3 Tube 4  . DULoxetine (CYMBALTA) 30 MG capsule Take 90 mg by mouth daily.     . DULoxetine (CYMBALTA) 60 MG capsule Take 1 capsule by mouth daily.  0  . FLOVENT HFA 220 MCG/ACT inhaler INHALE TWO PUFFS INTO THE LUNGS TWICE DAILY 12 g 0  . FLUoxetine (PROZAC) 20 MG capsule Take 1 capsule by mouth daily.    . furosemide (LASIX) 40 MG tablet TAKE ONE (1) TABLET BY MOUTH EVERY DAY 90 tablet 0  . gabapentin (NEURONTIN) 100 MG capsule Take 1 capsule (100 mg total) by mouth 3 (three) times daily. 90 capsule 3  . goserelin (ZOLADEX) 3.6 MG injection Inject 3.6 mg into the skin every 28 (twenty-eight) days.    . metFORMIN (GLUCOPHAGE) 500 MG tablet TAKE ONE (1) TABLET BY MOUTH TWO (2) TIMES DAILY WITH A MEAL 60 tablet 0  . metoCLOPramide (REGLAN) 10 MG tablet Take 1 tablet (10 mg total) by mouth every 8 (eight) hours as needed. (Patient taking differently: Take 10 mg by mouth every 8 (eight) hours as needed for nausea or vomiting. ) 60 tablet 1  . oxyCODONE (OXY IR/ROXICODONE) 5 MG immediate  release tablet Take 1 tablet (5 mg total) by mouth every 6 (six) hours as needed for severe pain. 120 tablet 0  . polyethylene glycol powder (GLYCOLAX) powder Take 17 g by mouth daily. (Patient taking differently: Take 17 g by mouth daily as needed for mild constipation. ) 500 g 5  . potassium chloride SA (K-DUR,KLOR-CON) 20 MEQ tablet Take 1 tablet (20 mEq total) by mouth 2 (two) times daily. 10 tablet 0  . propranolol (INDERAL) 20 MG tablet Take 1 tablet by mouth 2 (two) times daily.    Marland Kitchen tiZANidine (ZANAFLEX) 4 MG tablet TAKE ONE (1) TABLET BY MOUTH 3 TIMES DAILY 90 tablet 3  . topiramate (TOPAMAX) 200 MG tablet Take 200 mg by mouth daily.    Marland Kitchen  triamcinolone ointment (KENALOG) 0.5 % Apply 1 application topically 2 (two) times daily. 30 g 0  . VRAYLAR capsule     . VYVANSE 70 MG capsule Take 1 capsule by mouth daily.    Marland Kitchen zolpidem (AMBIEN) 10 MG tablet Take 1 tablet by mouth at bedtime as needed.     No current facility-administered medications on file prior to visit.     ALLERGIES: Penicillins; Lyrica [pregabalin]; Meloxicam; and Robaxin [methocarbamol]  Family History  Problem Relation Age of Onset  . Hypertension Mother   . Breast cancer Mother 31  . Diabetes type II Father   . Prostate cancer Father   . Stroke Neg Hx     SH:  Single, non smoker  Review of Systems  Endocrine: Positive for polydipsia.  Genitourinary: Positive for dysuria.  All other systems reviewed and are negative.   PHYSICAL EXAMINATION:    BP 124/88 (BP Location: Right Arm, Patient Position: Sitting, Cuff Size: Large)   Pulse 100   Ht 5' 3.5" (1.613 m)   Wt 224 lb (101.6 kg)   LMP 07/22/2014 (Approximate)   BMI 39.06 kg/m     General appearance: alert, cooperative and appears stated age Abdomen: soft, non-tender; bowel sounds normal; no masses,  no organomegaly Lymph:  no inguinal LAD noted  Pelvic: External genitalia:  no lesions              Urethra:  normal appearing urethra with no masses,  tenderness or lesions              Bartholins and Skenes: normal                 Vagina: erythematous vaginal mucosa with thick, white discharge present              Cervix: no lesions              Bimanual Exam:  Uterus:  normal size, contour, position, consistency, mobility, non-tender              Adnexa: no mass, fullness, tenderness  Chaperone was present for exam.  Assessment: Vaginal discharge New sexual partner Diabetes, type 2, not taking medication  Plan: Vaginitis testing obtained today HIV, RPR, Hep B and C testing obtained today as well HBA1C pending Terazol 7 nightly x 7 nights with application of cream externally as well recommended.

## 2018-04-28 NOTE — Telephone Encounter (Signed)
Patient says she has some type of infection.

## 2018-04-28 NOTE — Telephone Encounter (Signed)
Patient called back to state that she forgot to report that she is having some "brown discharge" on her panty liner. This is has been ongoing "for weeks."  Not heavy bleeding. Just spotting.   Patient on anastrozole.  Postmenopausal per prior office visit notes.  ? poor historian.   Reviewed with Nursing supervisor, change appointment to Dr. Sabra Heck.   Patient agreeable to appointment with Dr. Sabra Heck. Advised work in appointment but need to be seen by Dr. Sabra Heck for complaints.Pt agreeable.  Encounter closed.

## 2018-04-29 ENCOUNTER — Other Ambulatory Visit: Payer: Self-pay | Admitting: Registered Nurse

## 2018-04-29 ENCOUNTER — Other Ambulatory Visit: Payer: Self-pay | Admitting: Neurology

## 2018-04-29 LAB — HEP, RPR, HIV PANEL
HEP B S AG: NEGATIVE
HIV Screen 4th Generation wRfx: NONREACTIVE
RPR: NONREACTIVE

## 2018-04-29 LAB — HEMOGLOBIN A1C
Est. average glucose Bld gHb Est-mCnc: 117 mg/dL
HEMOGLOBIN A1C: 5.7 % — AB (ref 4.8–5.6)

## 2018-04-29 LAB — HEPATITIS C ANTIBODY

## 2018-05-01 NOTE — Telephone Encounter (Signed)
Called pt and LVM asking for call back to schedule an appt asap for follow up. Spoke with Janett Billow NP. Advised pt that NP can see her soon for f/u as long as appointments remain available. When pt calls back, will confirm she has no new issues before scheduling her with NP.

## 2018-05-03 LAB — NUSWAB VAGINITIS PLUS (VG+)
Atopobium vaginae: HIGH Score — AB
CHLAMYDIA TRACHOMATIS, NAA: NEGATIVE
Candida albicans, NAA: POSITIVE — AB
Candida glabrata, NAA: NEGATIVE
Neisseria gonorrhoeae, NAA: NEGATIVE
Trich vag by NAA: NEGATIVE

## 2018-05-05 ENCOUNTER — Inpatient Hospital Stay: Payer: Medicare Other

## 2018-05-16 ENCOUNTER — Ambulatory Visit: Payer: Medicare Other

## 2018-05-25 ENCOUNTER — Telehealth: Payer: Self-pay | Admitting: Physical Medicine & Rehabilitation

## 2018-05-25 ENCOUNTER — Encounter: Payer: Medicare Other | Attending: Registered Nurse | Admitting: Registered Nurse

## 2018-05-25 DIAGNOSIS — M546 Pain in thoracic spine: Secondary | ICD-10-CM | POA: Insufficient documentation

## 2018-05-25 DIAGNOSIS — I890 Lymphedema, not elsewhere classified: Secondary | ICD-10-CM | POA: Insufficient documentation

## 2018-05-25 MED ORDER — OXYCODONE HCL 5 MG PO TABS: 5.0000 mg | ORAL_TABLET | Freq: Four times a day (QID) | ORAL | 0 refills | Status: DC | PRN

## 2018-05-25 NOTE — Telephone Encounter (Signed)
Patient forgot about appointment today.  I have scheduled her to see Zella Ball on 12/31.  She will need medication, she thinks she got them filled on 11/25.

## 2018-05-25 NOTE — Telephone Encounter (Signed)
[name] missed her appointment today, she has been re-scheduled. PMP Aware reviewed, last Oxycodone prescription was filled on 04/29/2018. [name] is aware of the above.

## 2018-05-26 ENCOUNTER — Ambulatory Visit: Payer: Medicare Other

## 2018-05-26 NOTE — Telephone Encounter (Signed)
Message from patient

## 2018-06-06 ENCOUNTER — Encounter: Payer: Self-pay | Admitting: Registered Nurse

## 2018-06-06 ENCOUNTER — Encounter: Payer: Medicare Other | Admitting: Registered Nurse

## 2018-06-06 ENCOUNTER — Other Ambulatory Visit: Payer: Self-pay | Admitting: Neurology

## 2018-06-06 ENCOUNTER — Other Ambulatory Visit: Payer: Self-pay

## 2018-06-06 VITALS — BP 112/75 | HR 79 | Ht 64.0 in | Wt 212.0 lb

## 2018-06-06 DIAGNOSIS — G894 Chronic pain syndrome: Secondary | ICD-10-CM

## 2018-06-06 DIAGNOSIS — M62838 Other muscle spasm: Secondary | ICD-10-CM

## 2018-06-06 DIAGNOSIS — F329 Major depressive disorder, single episode, unspecified: Secondary | ICD-10-CM

## 2018-06-06 DIAGNOSIS — M5416 Radiculopathy, lumbar region: Secondary | ICD-10-CM | POA: Diagnosis not present

## 2018-06-06 DIAGNOSIS — G893 Neoplasm related pain (acute) (chronic): Secondary | ICD-10-CM

## 2018-06-06 DIAGNOSIS — M546 Pain in thoracic spine: Secondary | ICD-10-CM | POA: Diagnosis not present

## 2018-06-06 DIAGNOSIS — M542 Cervicalgia: Secondary | ICD-10-CM | POA: Diagnosis not present

## 2018-06-06 DIAGNOSIS — I890 Lymphedema, not elsewhere classified: Secondary | ICD-10-CM | POA: Diagnosis not present

## 2018-06-06 DIAGNOSIS — Z5181 Encounter for therapeutic drug level monitoring: Secondary | ICD-10-CM

## 2018-06-06 DIAGNOSIS — Z79899 Other long term (current) drug therapy: Secondary | ICD-10-CM

## 2018-06-06 DIAGNOSIS — R413 Other amnesia: Secondary | ICD-10-CM

## 2018-06-06 DIAGNOSIS — M5412 Radiculopathy, cervical region: Secondary | ICD-10-CM

## 2018-06-06 MED ORDER — OXYCODONE HCL 5 MG PO TABS: 5.0000 mg | ORAL_TABLET | Freq: Four times a day (QID) | ORAL | 0 refills | Status: DC | PRN

## 2018-06-06 NOTE — Progress Notes (Signed)
Subjective:    Patient ID: Karen Hopkins, female    DOB: 1975-08-02, 42 y.o.   MRN: [id number]  HPI: Karen Hopkins is a 42 y.o. female who returns for follow up appointment for chronic pain and medication refill. Karen Hopkins states her pain is located in her neck radiating into her right shoulder and mid to lower back pain.Karen Hopkins rates her pain 8. Her current exercise regime is walking and performing stretching exercises.  Karen Hopkins reports Karen Hopkins's having difficulty remembering her scheduled doctor  appointments, Karen Hopkins was suppose to call her neurologist last month, Karen Hopkins hasn't called Karen Hopkins states. Today a referral will be placed to neurology regarding the above Karen Hopkins verbalizes understanding.    Karen Hopkins very tearful today, with recent loss of her mother and holidays, emotional support given. Karen Hopkins was encouraged to continue with counseling, Karen Hopkins verbalizes understanding.   Karen Hopkins Morphine equivalent is 30.00 MME. Karen Hopkins is also prescribed Alprazolam  By Lorelle Gibbs NP .We have discussed the black box warning of using opioids and benzodiazepines. I highlighted the dangers of using these drugs together and discussed the adverse events including respiratory suppression, overdose, cognitive impairment and importance of compliance with current regimen. We will continue to monitor and adjust as indicated.  Karen Hopkins is being closely monitored and under the care of her psychiatrist at the [facility]. .  Pain Inventory Average Pain 7 Pain Right Now 8 My pain is burning, dull, stabbing, tingling and aching  In the last 24 hours, has pain interfered with the following? General activity 6 Relation with others 6 Enjoyment of life 7 What TIME of day is your pain at its worst? all Sleep (in general) Poor  Pain is worse with: walking, bending, sitting, standing and some activites Pain improves with: rest, therapy/exercise, medication, TENS and injections Relief from Meds: 6  Mobility walk with assistance use a  cane how many minutes can you walk? 10 ability to climb steps?  no do you drive?  yes  Function disabled: date disabled n/a  Neuro/Psych numbness tingling spasms depression anxiety  Prior Studies Any changes since last visit?  no  Physicians involved in your care Any changes since last visit?  no   Family History  Problem Relation Age of Onset  . Hypertension Mother   . Breast cancer Mother 23  . Diabetes type II Father   . Prostate cancer Father   . Stroke Neg Hx    Social History   Socioeconomic History  . Marital status: Single    Spouse name: Not on file  . Number of children: 3  . Years of education: 53  . Highest education level: Not on file  Occupational History  . Occupation: Disability   Social Needs  . Financial resource strain: Not on file  . Food insecurity:    Worry: Not on file    Inability: Not on file  . Transportation needs:    Medical: Not on file    Non-medical: Not on file  Tobacco Use  . Smoking status: Former Smoker    Packs/day: 0.25    Years: 5.00    Pack years: 1.25    Types: Cigarettes    Start date: 06/07/2002    Last attempt to quit: 04/26/2010    Years since quitting: 8.1  . Smokeless tobacco: Never Used  Substance and Sexual Activity  . Alcohol use: No  . Drug use: No  . Sexual activity: Not Currently    Birth control/protection: Surgical  Lifestyle  .  Physical activity:    Days per week: Not on file    Minutes per session: Not on file  . Stress: Not on file  Relationships  . Social connections:    Talks on phone: Not on file    Gets together: Not on file    Attends religious service: Not on file    Active member of club or organization: Not on file    Attends meetings of clubs or organizations: Not on file    Relationship status: Not on file  Other Topics Concern  . Not on file  Social History Narrative   Lives with kids   Caffeine use: none    Past Surgical History:  Procedure Laterality Date  . BREAST  BIOPSY Right 08/26/2009  . BREAST REDUCTION SURGERY Left 08/23/2013   Procedure: LEFT BREAST REDUCTION  ;  Surgeon: Theodoro Kos, DO;  Location: [place];  Service: Plastics;  Laterality: Left;  . CESAREAN SECTION  07/15/2001; 03/18/2004; 11/21/2008  . lateral orbiotomy Left 11/2015   Dr. Toy Cookey at [facility]  . LATISSIMUS FLAP TO BREAST Right 03/12/2013   Procedure: RIGHT BREAST LATISSIMUS FLAP WITH EXPANDER PLACEMENT;  Surgeon: Theodoro Kos, DO;  Location: [place];  Service: Plastics;  Laterality: Right;  . LIPOSUCTION Bilateral 08/23/2013   Procedure: LIPOSUCTION;  Surgeon: Theodoro Kos, DO;  Location: [place];  Service: Plastics;  Laterality: Bilateral;  . MASTECTOMY Right 2011  . MODIFIED RADICAL MASTECTOMY Right 03/11/2010  . NASAL SEPTUM SURGERY    . PORT-A-CATH REMOVAL Left 12/01/2010  . PORTACATH PLACEMENT Left 09/12/2009  . REDUCTION MAMMAPLASTY Left 08/2013  . REMOVAL OF TISSUE EXPANDER AND PLACEMENT OF IMPLANT Right 08/23/2013   Procedure: REMOVAL RIGHT TISSUE EXPANDER AND PLACEMENT OF IMPLANT TO RIGHT BREAST ;  Surgeon: Theodoro Kos, DO;  Location: [place];  Service: Plastics;  Laterality: Right;  . SUPRA-UMBILICAL HERNIA  1157  . TUBAL LIGATION  11/21/2008   Past Medical History:  Diagnosis Date  . Anxiety   . Cancer ([place]) 2011   Rt. Br. Ca  . Depression   . Essential hypertension 11/05/2015  . History of breast cancer 2011   right  . History of chemotherapy 2011  . History of radiation therapy 2011  . Hypertension    under control with med., has been on med. x 2 yr.  . Lymphedema of arm    right; no BP or puncture to right arm  . Personal history of chemotherapy 09/16/2009   rt breast  . Personal history of radiation therapy 05/2010   rt breast  . Pneumonia   . Seasonal allergies   . Sinus headache    BP 112/75   Pulse 79   Ht 5\' 4"  (1.626 m)   Wt 212 lb (96.2 kg)   LMP 07/22/2014 (Approximate)   SpO2 98%   BMI 36.39  kg/m   Opioid Risk Score:   Fall Risk Score:  `1  Depression screen PHQ 2/9  Depression screen [facility] 2/9 06/06/2018 04/27/2018 04/10/2018 01/26/2018 12/19/2017 08/23/2017 08/31/2016  Decreased Interest 2 1 0 3 3 1  0  Down, Depressed, Hopeless 2 1 0 3 3 1  0  PHQ - 2 Score 4 2 0 6 6 2  0  Some recent data might be hidden     Review of Systems  Constitutional: Negative.   HENT: Negative.   Eyes: Negative.   Respiratory: Negative.   Cardiovascular: Negative.   Gastrointestinal: Negative.   Endocrine: Negative.  Genitourinary: Positive for dyspareunia.  Musculoskeletal: Negative.   Skin: Negative.   Allergic/Immunologic: Negative.   Neurological: Negative.   Hematological: Negative.   Psychiatric/Behavioral: Negative.   All other systems reviewed and are negative.      Objective:   Physical Exam Vitals signs and nursing note reviewed.  Constitutional:      Appearance: Normal appearance.  Neck:     Musculoskeletal: Normal range of motion and neck supple.  Cardiovascular:     Rate and Rhythm: Normal rate and regular rhythm.     Pulses: Normal pulses.     Heart sounds: Normal heart sounds.  Pulmonary:     Effort: Pulmonary effort is normal.     Breath sounds: Normal breath sounds.  Musculoskeletal:     Comments: Normal Muscle Bulk and Muscle Testing Reveals:  Upper Extremities: Right: Decreased ROM 45 Degrees and Muscle Strength 5/5 Right AC Joint Tenderness Left: Full ROM and Muscle Strength 5/5  Thoracic and Lumbar Hypersensitivity Lower Extremities: Decreased ROM and Muscle Strength 5/5 Arises from Table with ease Narrow Based Gait   Skin:    General: Skin is warm and dry.  Neurological:     Mental Status: Karen Hopkins is alert and oriented to [place], place, and time.  Psychiatric:        Mood and Affect: Mood normal.        Behavior: Behavior normal.           Assessment & Plan:  1.Myofascial pain syndrome chronic postoperative, as well as post radiation: S/P  Breast Reconstruction Surgery x 2. Continueto Monitor.06/06/2018.Continue current medication regime: Refilled: Oxycodone 5 mg one tablet every 6 hours as needed for severe pain #120.Continue HEP. We will continue the opioid monitoring program, this consists of regular clinic visits, examinations, urine drug screen, pill counts as well as use of [place] Controlled Substance Reporting System. 2. Neoplasm Related Pain/ Cervical radiculitis:Refilled: Gabapentin, educated on medication compliance. Continue to Monitor. 06/06/2018 3. Lymphedema of Right Upper Extremity: Oncology Following.06/06/2018 4.Cervicalgia/Cervical Dystonia: S/P Botox on 04/10/2018. 06/06/2018 5. Obesity: Continue with Healthy Diet and Exercise Regime.06/06/2018 6. Muscle Spasms: Continuecurrent medication regimeTizanidine.06/06/2018 7. Depression: Continue Cymbalta and Counseling at [facility].06/06/2018 8.Chronic Bilateral Knee Pai/ OA: Continue VoltarenGel.06/06/2018 9.Chronic Midline/ Low Back Bain/ Bilateral Low Back Pain: Continue with HEP as Tolerated. Continue current medication regimen.06/06/2018 10. Neuropathic Pain:ContinueGabapentin:06/06/2018 11. Opioid Induced Constipation: Movantik not on her Drug Formulary: Continue: Amitiza. 06/06/2018. 12. Bilateral Greater Trochanter Bursitis:No complaints today.Continue HEP and Ice Therapy as needed.06/06/2018. 13. Bilateral Knee Pain: No complaints today. Continue HEP as Tolerated. Continue to Monitor.06/06/2018. 14. Memory Changes: Referral to Neurology Dr. Jaynee Eagles. Continue to Monitor.   20 minutes of face to face patient care time was spent during this visit. All questions were encouraged and answered.   F/U in 1 month

## 2018-06-08 ENCOUNTER — Telehealth: Payer: Self-pay | Admitting: *Deleted

## 2018-06-08 NOTE — Telephone Encounter (Signed)
Pt needs appt. See other phone note. Pt called.

## 2018-06-08 NOTE — Telephone Encounter (Signed)
Called pt & spoke with her. Scheduled her with Janett Billow NP tomorrow @ 10:00 arrival 09:30.

## 2018-06-08 NOTE — Telephone Encounter (Signed)
Spoke with Janett Billow NP who can see pt. Called pt and LVM (ok per DPR) asking for call back asap to schedule pt. Offered appt tomorrow. Left office number and hours in message.

## 2018-06-08 NOTE — Telephone Encounter (Signed)
Spoke with pt and advised her that pt will need appt in order to continue providing refills of the Topiramate. Pt verbalized understanding. She stated that a new referral had been sent for memory changes. She stated that Topiramate works very well for her migraines however.

## 2018-06-09 ENCOUNTER — Encounter: Payer: Self-pay | Admitting: Adult Health

## 2018-06-09 ENCOUNTER — Ambulatory Visit (INDEPENDENT_AMBULATORY_CARE_PROVIDER_SITE_OTHER): Payer: Medicare Other | Admitting: Adult Health

## 2018-06-09 VITALS — BP 121/63 | HR 80 | Ht 64.0 in | Wt 214.0 lb

## 2018-06-09 DIAGNOSIS — G43009 Migraine without aura, not intractable, without status migrainosus: Secondary | ICD-10-CM

## 2018-06-09 DIAGNOSIS — R413 Other amnesia: Secondary | ICD-10-CM

## 2018-06-09 MED ORDER — TOPIRAMATE 50 MG PO TABS: 150.0000 mg | ORAL_TABLET | Freq: Every day | ORAL | 3 refills | Status: DC

## 2018-06-09 NOTE — Progress Notes (Signed)
[facility]    Provider:  Dr Jaynee Eagles Referring Provider: [name], MD Primary Care Physician:  [name], MD  Chief Complaint  Patient presents with  . Follow-up    Migraine and pt having new onset for memory issues room 9, Pt stated her mom pass away in May 2019. She goes to mental health [facility] for her depression and they prescribed cymbalta, xanax, and wellbutrin.Pt also goes to pain clinic. patient alone     Interval history 06/09/2018: Patient is being seen today as she was requesting refill of topiramate but advised her that she needs to be seen in office prior to refill.  She does state that migraines have been managed well with topamax and unable to state to when her last migraine occurred.  She does have new concerns today of memory loss which has been worsening over the past year.  She does have some underlying cognitive deficits due to prior radiation treatment but she does endorse worsening recently.  She says she has a difficult time remembering dates such as scheduled appointments and activities that her children are involved in.  She states she writes them down on a calendar but at times will even forget to look at her calendar to see if she has any scheduled appointments.  Her children are 41, 14 and 9 and are actively involved in sports and she does endorse feeling overwhelmed with trying to manage all of this.  She does see a psychiatrist for underlying depression and anxiety.  She states that she is unsure if she will be discharged from this clinic due to multiple no-shows as she is unable to remember her scheduled appointments.  She also has an extensive history of chronic pain and neuropathy for which she is being treated with gabapentin, tizanidine and oxycodone.  She is currently being prescribed Xanax, Cymbalta, Prozac and Ambien for depression and anxiety.   10/27/2016 visit Dr. Jaynee Eagles: No more headaches, doing well on the Topiramate. No  issues whatsoever. No side effects to the topiramate. She is doing extremely well. She has also lost weight which is good. She feels much better. She follows with cardiology. No vision changes. Continue topiramate 200 mg a day.  06/29/2016 visit Dr. Jaynee Eagles; Here for follow up of headaches. The headaches have significantly improved. No side from the Topiramate. She has lost weight which is good. She feels much better. She follows with cardiology. She is feeling very well. No vision changes. Will repeat MRI brain in April. Will increase Topiramate to 200mg  a day.   01/27/2016 visit Dr. Jaynee Eagles: 43 y.o. female here as a referral from Dr. Ardelia Mems for intractable headaches. PMHx of right breast carcinoma, Status post modified radical mastectomy with postoperative radiation therapy In 2014, HTN, diabetes, migraine, depression, anxiety. She has chronic migraines. MRI of the brain showed a focus of FLAIR hyperintensity, right frontal subcortical white matter possibly a nonspecific focus of ischemic demyelination, or possible post treatment effect. Also seen was an Empty sella.She is on ASA 81mg  for stroke prevention. She went for surgery and saw neuro-ophthalmology at [facility] for enlarged lacrimal glands, biopsy was performed and biopsy was unrevealing and steroid injection swere placed into both eyes. She saw cardiology for evaluation after echocardiogram. She is being managed by cardiology for her HTN. Her migraines are improved she still have some headaches around the eyes. No vision changes, no blurry vision, no hearing changes, no side effects to the Topiramate. She has 3 boys no more  children.    HPI: Initial visit 09/01/2015 AA: Karen Hopkins a 43 y.o.femalehere as a referral from Dr. Rayburn Go intractable headaches. PMHx of right breast carcinoma, Status post modified radical mastectomy with postoperative radiation therapy In 2014, HTN, diabetes, migraine, depression, anxiety. She has had  headaches for years. Since she was a teenager. Has to go into a dark room. The headache is worse with laying down when it is very bad. She has to sit very still. She wakes up with the headaches. Not taking tylenol, advil or naproxen daily no overuse headache. Headache starts on the right around the eye, throbbing, pulsing, and spreads to the other side of the head. She has nausea, no vomiting. She has photophobia, phonophobia. She wakes up with headaches. Snores a lot, kids tell her she excessively snores. She is very tired during the day. She also has cervical dystonia and just received botox. She has a headache several times a week and can last up to 3 days each. At least 15-20 headaches a month and at least 8 are migrainous. This frequency for over a year. Can be severe 10/10 pain. She can be excessively tired during the week but she had a sleep test for OSA neg 06/23/2011.    Review of Systems: Patient complains of symptoms per HPI as well as the following symptoms: Weight loss, fatigue, blurred vision, joint pain, allergies, memory loss, confusion, numbness, dizziness, tremor, depression, anxiety, change in appetite and disinterest in activities.  Pertinent negatives per HPI. All others negative.   Social History   Socioeconomic History  . Marital status: Single    Spouse name: Not on file  . Number of children: 3  . Years of education: 110  . Highest education level: Not on file  Occupational History  . Occupation: Disability   Social Needs  . Financial resource strain: Not on file  . Food insecurity:    Worry: Not on file    Inability: Not on file  . Transportation needs:    Medical: Not on file    Non-medical: Not on file  Tobacco Use  . Smoking status: Former Smoker    Packs/day: 0.25    Years: 5.00    Pack years: 1.25    Types: Cigarettes    Start date: 06/07/2002    Last attempt to quit: 04/26/2010    Years since quitting: 8.1  . Smokeless tobacco: Never Used  Substance  and Sexual Activity  . Alcohol use: No  . Drug use: No  . Sexual activity: Not Currently    Birth control/protection: Surgical  Lifestyle  . Physical activity:    Days per week: Not on file    Minutes per session: Not on file  . Stress: Not on file  Relationships  . Social connections:    Talks on phone: Not on file    Gets together: Not on file    Attends religious service: Not on file    Active member of club or organization: Not on file    Attends meetings of clubs or organizations: Not on file    Relationship status: Not on file  . Intimate partner violence:    Fear of current or ex partner: Not on file    Emotionally abused: Not on file    Physically abused: Not on file    Forced sexual activity: Not on file  Other Topics Concern  . Not on file  Social History Narrative   Lives with kids   Caffeine use:  none     Family History  Problem Relation Age of Onset  . Hypertension Mother   . Breast cancer Mother 87  . Diabetes type II Father   . Prostate cancer Father   . Stroke Neg Hx     Past Medical History:  Diagnosis Date  . Anxiety   . Cancer ([place]) 2011   Rt. Br. Ca  . Depression   . Essential hypertension 11/05/2015  . History of breast cancer 2011   right  . History of chemotherapy 2011  . History of radiation therapy 2011  . Hypertension    under control with med., has been on med. x 2 yr.  . Lymphedema of arm    right; no BP or puncture to right arm  . Personal history of chemotherapy 09/16/2009   rt breast  . Personal history of radiation therapy 05/2010   rt breast  . Pneumonia   . Seasonal allergies   . Sinus headache     Past Surgical History:  Procedure Laterality Date  . BREAST BIOPSY Right 08/26/2009  . BREAST REDUCTION SURGERY Left 08/23/2013   Procedure: LEFT BREAST REDUCTION  ;  Surgeon: Theodoro Kos, DO;  Location: [place];  Service: Plastics;  Laterality: Left;  . CESAREAN SECTION  07/15/2001; 03/18/2004; 11/21/2008    . lateral orbiotomy Left 11/2015   Dr. Toy Cookey at [facility]  . LATISSIMUS FLAP TO BREAST Right 03/12/2013   Procedure: RIGHT BREAST LATISSIMUS FLAP WITH EXPANDER PLACEMENT;  Surgeon: Theodoro Kos, DO;  Location: [place];  Service: Plastics;  Laterality: Right;  . LIPOSUCTION Bilateral 08/23/2013   Procedure: LIPOSUCTION;  Surgeon: Theodoro Kos, DO;  Location: [place];  Service: Plastics;  Laterality: Bilateral;  . MASTECTOMY Right 2011  . MODIFIED RADICAL MASTECTOMY Right 03/11/2010  . NASAL SEPTUM SURGERY    . PORT-A-CATH REMOVAL Left 12/01/2010  . PORTACATH PLACEMENT Left 09/12/2009  . REDUCTION MAMMAPLASTY Left 08/2013  . REMOVAL OF TISSUE EXPANDER AND PLACEMENT OF IMPLANT Right 08/23/2013   Procedure: REMOVAL RIGHT TISSUE EXPANDER AND PLACEMENT OF IMPLANT TO RIGHT BREAST ;  Surgeon: Theodoro Kos, DO;  Location: [place];  Service: Plastics;  Laterality: Right;  . SUPRA-UMBILICAL HERNIA  9509  . TUBAL LIGATION  11/21/2008    Current Outpatient Medications  Medication Sig Dispense Refill  . albuterol (PROAIR HFA) 108 (90 Base) MCG/ACT inhaler Inhale 2 puffs into the lungs every 6 (six) hours as needed for wheezing or shortness of breath. 18 g 3  . albuterol (PROVENTIL) (2.5 MG/3ML) 0.083% nebulizer solution Take 3 mLs (2.5 mg total) by nebulization every 6 (six) hours as needed for wheezing or shortness of breath. 75 mL 3  . ALPRAZolam (XANAX XR) 2 MG 24 hr tablet Take 1 tablet by mouth as needed.  0  . ALPRAZolam (XANAX) 0.5 MG tablet   0  . AMITIZA 8 MCG capsule TAKE ONE CAPSULE BY MOUTH DAILY WITH BREAKFAST 30 capsule 1  . anastrozole (ARIMIDEX) 1 MG tablet Take 1 tablet (1 mg total) by mouth daily. 90 tablet 4  . Armodafinil 250 MG tablet   0  . buPROPion (WELLBUTRIN XL) 150 MG 24 hr tablet Take 1 tablet by mouth daily.  0  . carvedilol (COREG) 6.25 MG tablet TAKE ONE (1) TABLET BY MOUTH TWO (2) TIMES DAILY WITH A MEAL 180 tablet 0  . diclofenac sodium  (VOLTAREN) 1 % GEL Apply 4 g topically 4 (four) times daily. Use as directed 3  Tube 4  . DULoxetine (CYMBALTA) 30 MG capsule Take 90 mg by mouth daily.     Marland Kitchen FLOVENT HFA 220 MCG/ACT inhaler INHALE TWO PUFFS INTO THE LUNGS TWICE DAILY 12 g 0  . FLUoxetine (PROZAC) 20 MG capsule Take 1 capsule by mouth daily.    . furosemide (LASIX) 40 MG tablet TAKE ONE (1) TABLET BY MOUTH EVERY DAY 90 tablet 0  . gabapentin (NEURONTIN) 100 MG capsule Take 1 capsule (100 mg total) by mouth 3 (three) times daily. 90 capsule 3  . goserelin (ZOLADEX) 3.6 MG injection Inject 3.6 mg into the skin every 28 (twenty-eight) days.    . metFORMIN (GLUCOPHAGE) 500 MG tablet TAKE ONE (1) TABLET BY MOUTH TWO (2) TIMES DAILY WITH A MEAL 60 tablet 0  . metoCLOPramide (REGLAN) 10 MG tablet Take 1 tablet (10 mg total) by mouth every 8 (eight) hours as needed. (Patient taking differently: Take 10 mg by mouth every 8 (eight) hours as needed for nausea or vomiting. ) 60 tablet 1  . oxyCODONE (OXY IR/ROXICODONE) 5 MG immediate release tablet Take 1 tablet (5 mg total) by mouth every 6 (six) hours as needed for severe pain. 120 tablet 0  . polyethylene glycol powder (GLYCOLAX) powder Take 17 g by mouth daily. (Patient taking differently: Take 17 g by mouth daily as needed for mild constipation. ) 500 g 5  . potassium chloride SA (K-DUR,KLOR-CON) 20 MEQ tablet Take 1 tablet (20 mEq total) by mouth 2 (two) times daily. 10 tablet 0  . propranolol (INDERAL) 20 MG tablet Take 1 tablet by mouth 2 (two) times daily.    Marland Kitchen tiZANidine (ZANAFLEX) 4 MG tablet TAKE ONE (1) TABLET BY MOUTH 3 TIMES DAILY 90 tablet 3  . topiramate (TOPAMAX) 50 MG tablet Take 3 tablets (150 mg total) by mouth at bedtime. 90 tablet 3  . triamcinolone ointment (KENALOG) 0.5 % Apply 1 application topically 2 (two) times daily. 30 g 0  . VRAYLAR capsule     . VYVANSE 70 MG capsule Take 1 capsule by mouth daily.    Marland Kitchen zolpidem (AMBIEN) 10 MG tablet Take 1 tablet by mouth at  bedtime as needed.     No current facility-administered medications for this visit.     Allergies as of 06/09/2018 - Review Complete 06/09/2018  Allergen Reaction Noted  . Penicillins Swelling 04/14/2010  . Lyrica [pregabalin] Nausea Only 01/19/2012  . Meloxicam Nausea Only 12/22/2011  . Robaxin [methocarbamol] Nausea Only 12/22/2011    Vitals: BP 121/63 (BP Location: Left Arm, Patient Position: Sitting, Cuff Size: Large)   Pulse 80   Ht 5\' 4"  (1.626 m)   Wt 214 lb (97.1 kg)   LMP 07/22/2014 (Approximate)   BMI 36.73 kg/m  Last Weight:  Wt Readings from Last 1 Encounters:  06/09/18 214 lb (97.1 kg)   Last Height:   Ht Readings from Last 1 Encounters:  06/09/18 5\' 4"  (1.626 m)    Physical exam: Exam: Gen: NAD, conversant, well nourised, pleasant morbidly obese, well groomed  CV: RRR, no MRG. No Carotid Bruits. No peripheral edema, warm, nontender Eyes: Conjunctivae clear without exudates or hemorrhage  Neuro: Detailed Neurologic Exam  Speech: Speech is normal; fluent and spontaneous with normal comprehension.  Cognition: The patient is oriented to [place], place, and time;  recent and remote memory intact;     Recall: 2/3    Able to do serial additions    Animal naming: Initial 3 but after multiple prompting 10 language  fluent;  normal attention, concentration,  fund of knowledge Cranial Nerves: The pupils are equal, round, and reactive to light. The fundi are normal. Visual fields are full to finger confrontation. Extraocular movements are intact. Trigeminal sensation is intact and the muscles of mastication are normal. The face is symmetric. The palate elevates in the midline. Hearing intact. Voice is normal. Shoulder shrug is normal. The tongue has normal motion without fasciculations.   Coordination: Normal finger to nose and heel to shin. Normal rapid alternating movements.   Gait: Heel-toe and tandem gait  are normal.   Motor Observation: No asymmetry, no atrophy, and no involuntary movements noted.    Subjective tremor but no tremor indicated on exam  Tone: Normal muscle tone.   Posture: Posture is normal. normal erect  Strength: Strength is V/V in the upper and lower limbs.  Poor effort with exam stating "I am just weak everywhere" but inconsistent and unable to determine true weakness  Sensation: Decreased sensation right lower extremity due to history of neuropathy  Reflex Exam: DTR's: Deep tendon reflexes in the upper and lower extremities are normal bilaterally.  Toes: The toes are downgoing bilaterally.  Clonus: Clonus is absent.   Assessment/Plan:Karen Hopkins is a 43 year old female with chronic history of intractable headaches along with PMH right breast carcinoma, status post modified radical mastectomy with postoperative radiation in 2014, HTN, depression and anxiety. She has been treated on topiramate since 2017 by Dr. Jaynee Eagles and has done exceptionally with migraine control.  She returns today for medication refill along with new concerns of memory loss.  Advised patient that her memory loss could be multifactorial due to polypharmacy, history of radiation, and increased stress with underlying depression and anxiety.  Also advised her that topiramate does have a side effect occasionally at memory loss.  At this time, has for migraines have been stable we will decrease dose of topiramate from 200 mg nightly to 150 mg nightly.  Advised her that we will continue this dosage for the next couple of months and can decrease further if needed.  Advised her that if she starts to experience worsening of migraines, to call office and we can consider additional migraine prophylactic medication at that time.  We will also check labs including vitamin B12 and TSH.  Highly encouraged her to call her psychiatrist office to determine if she is in fact being  discharged and if so, to follow-up with PCP for need of psychiatrist referral for continued management.  Highly encouraged stress reduction techniques and importance of this.  Recommend neurocognitive evaluation but advised her that if her memory starts to improve with decreasing topiramate, she can cancel this appointment.  This is recommended to assess full extent of cognitive deficits as she is having a difficult time maintaining providers, underlying cognitive deficits from radiation and to determine if stress/anxiety/depression is playing a role in her memory loss.  At prior appointment, MRI repeat was held off as she was doing well neurologically.  Advised her that I will follow-up with Dr. Jaynee Eagles to see if she would like to repeat MRI at this time and will be notified if so.  She will follow-up in 6 months time or call earlier if needed with questions, concerns or need of sooner follow-up appointment     Venancio Poisson, AGNP-BC  [facility] [address] [place], [place] [address]  Phone [phone] Fax [phone] Note: This document was prepared with digital dictation and possible smart phrase technology. Any transcriptional errors  that result from this process are unintentional.

## 2018-06-09 NOTE — Patient Instructions (Addendum)
Your Plan:  Decrease topamax from 200mg  nightly to 150mg  nightly - please let us know if you start to experience worsening headaches with decreasing dose  Your memory could be effected right now due to a variety of things such as stress, depression, anxiety  But also could be worsening from topamax. We will try to decrease dose to see if this helps with your memory. We will also check your thyroid and vitamin b12 levels.   We will also have you undergo neuro cognitive evaluation - you will be called to schedule appointment    Please follow up in 6 months or call earlier if needed      Thank you for coming to see [place] at [facility]. I hope we have been able to provide you high quality care today.  You may receive a patient satisfaction survey over the next few weeks. We would appreciate your feedback and comments so that we may continue to improve ourselves and the health of our patients.

## 2018-06-10 LAB — CBC
HEMOGLOBIN: 14.5 g/dL (ref 11.1–15.9)
Hematocrit: 44.6 % (ref 34.0–46.6)
MCH: 29.2 pg (ref 26.6–33.0)
MCHC: 32.5 g/dL (ref 31.5–35.7)
MCV: 90 fL (ref 79–97)
Platelets: 275 10*3/uL (ref 150–450)
RBC: 4.97 x10E6/uL (ref 3.77–5.28)
RDW: 14.2 % (ref 12.3–15.4)
WBC: 7.4 10*3/uL (ref 3.4–10.8)

## 2018-06-10 LAB — TSH: TSH: 1.17 u[IU]/mL (ref 0.450–4.500)

## 2018-06-10 LAB — VITAMIN B12: VITAMIN B 12: 383 pg/mL (ref 232–1245)

## 2018-06-12 ENCOUNTER — Telehealth: Payer: Self-pay

## 2018-06-12 NOTE — Telephone Encounter (Signed)
-----   Message from Venancio Poisson, NP sent at 06/12/2018  6:40 AM EST ----- Please notify patient that her recent lab testing did show vitamin B12 on the lower range of normal and recommend speaking with PCP about possible need for initiating supplementation.  Her thyroid level was normal along with her other blood counts.

## 2018-06-12 NOTE — Telephone Encounter (Signed)
Rn call patient about her lab work. Rn stated the labs showed vitamin b12 on lower range of normal. Its recommend she speak with her PCP about possible need for initiating supplementation.Her thyroid level was normal and other blood counts. Pt verbalized understanding. ------

## 2018-06-14 NOTE — Progress Notes (Signed)
agree with history, physical, neuro exam,assessment and plan as stated.     Sarina Ill, MD [facility]

## 2018-06-29 ENCOUNTER — Encounter: Payer: Self-pay | Admitting: Registered Nurse

## 2018-06-29 ENCOUNTER — Encounter: Payer: Medicare Other | Attending: Registered Nurse | Admitting: Registered Nurse

## 2018-06-29 VITALS — BP 120/83 | HR 78 | Ht 64.0 in | Wt 212.0 lb

## 2018-06-29 DIAGNOSIS — M546 Pain in thoracic spine: Secondary | ICD-10-CM | POA: Diagnosis not present

## 2018-06-29 DIAGNOSIS — M542 Cervicalgia: Secondary | ICD-10-CM | POA: Diagnosis not present

## 2018-06-29 DIAGNOSIS — Z5181 Encounter for therapeutic drug level monitoring: Secondary | ICD-10-CM | POA: Diagnosis not present

## 2018-06-29 DIAGNOSIS — M5412 Radiculopathy, cervical region: Secondary | ICD-10-CM | POA: Diagnosis not present

## 2018-06-29 DIAGNOSIS — M545 Low back pain, unspecified: Secondary | ICD-10-CM

## 2018-06-29 DIAGNOSIS — M170 Bilateral primary osteoarthritis of knee: Secondary | ICD-10-CM

## 2018-06-29 DIAGNOSIS — Z79899 Other long term (current) drug therapy: Secondary | ICD-10-CM

## 2018-06-29 DIAGNOSIS — F329 Major depressive disorder, single episode, unspecified: Secondary | ICD-10-CM

## 2018-06-29 DIAGNOSIS — G8929 Other chronic pain: Secondary | ICD-10-CM

## 2018-06-29 DIAGNOSIS — G893 Neoplasm related pain (acute) (chronic): Secondary | ICD-10-CM

## 2018-06-29 DIAGNOSIS — G243 Spasmodic torticollis: Secondary | ICD-10-CM

## 2018-06-29 DIAGNOSIS — I890 Lymphedema, not elsewhere classified: Secondary | ICD-10-CM | POA: Diagnosis not present

## 2018-06-29 DIAGNOSIS — M62838 Other muscle spasm: Secondary | ICD-10-CM

## 2018-06-29 MED ORDER — OXYCODONE HCL 5 MG PO TABS: 5.0000 mg | ORAL_TABLET | Freq: Four times a day (QID) | ORAL | 0 refills | Status: DC | PRN

## 2018-06-29 NOTE — Progress Notes (Signed)
Subjective:    Patient ID: Karen Hopkins, female    DOB: [birth year], 43 y.o.   MRN: [id number]  HPI: Karen Hopkins is a 43 y.o. female who returns for follow up appointment for chronic pain and medication refill. She states her pain is located in her right shoulder and lower back. She. Rates her pain 7. Her current exercise regime is walking and performing stretching exercises.  Karen Hopkins reports she is no longer going to [facility] she was discharged she missed too may appointments, also reports she has weaned herself off of her antidepressants and benzodiazepines. Also states her thoughts are clearer and she is receiving spiritual counseling from her pastor. Offered to place a new referral for Psychology she refuses at this time, will call office if she changes her mind, she states.   Karen Hopkins Morphine equivalent is 30.00 MME.  Last UDs was performed on 12/19/2017, it was consistent. UDS ordered today.   Pain Inventory Average Pain 8 Pain Right Now 7 My pain is constant, sharp, burning, dull, stabbing, tingling and aching  In the last 24 hours, has pain interfered with the following? General activity 7 Relation with others 7 Enjoyment of life 7 What TIME of day is your pain at its worst? morning evening and night Sleep (in general) Poor  Pain is worse with: walking, bending, inactivity, standing and some activites Pain improves with: therapy/exercise, pacing activities, medication, TENS and injections Relief from Meds: na  Mobility walk without assistance walk with assistance  Function disabled: date disabled na  Neuro/Psych numbness tingling  Prior Studies Any changes since last visit?  no  Physicians involved in your care Any changes since last visit?  no   Family History  Problem Relation Age of Onset  . Hypertension Mother   . Breast cancer Mother 87  . Diabetes type II Father   . Prostate cancer Father   . Stroke Neg Hx    Social History    Socioeconomic History  . Marital status: Single    Spouse name: Not on file  . Number of children: 3  . Years of education: 30  . Highest education level: Not on file  Occupational History  . Occupation: Disability   Social Needs  . Financial resource strain: Not on file  . Food insecurity:    Worry: Not on file    Inability: Not on file  . Transportation needs:    Medical: Not on file    Non-medical: Not on file  Tobacco Use  . Smoking status: Former Smoker    Packs/day: 0.25    Years: 5.00    Pack years: 1.25    Types: Cigarettes    Start date: 06/07/2002    Last attempt to quit: 04/26/2010    Years since quitting: 8.1  . Smokeless tobacco: Never Used  Substance and Sexual Activity  . Alcohol use: No  . Drug use: No  . Sexual activity: Not Currently    Birth control/protection: Surgical  Lifestyle  . Physical activity:    Days per week: Not on file    Minutes per session: Not on file  . Stress: Not on file  Relationships  . Social connections:    Talks on phone: Not on file    Gets together: Not on file    Attends religious service: Not on file    Active member of club or organization: Not on file    Attends meetings of clubs or organizations: Not  on file    Relationship status: Not on file  Other Topics Concern  . Not on file  Social History Narrative   Lives with kids   Caffeine use: none    Past Surgical History:  Procedure Laterality Date  . BREAST BIOPSY Right 08/26/2009  . BREAST REDUCTION SURGERY Left 08/23/2013   Procedure: LEFT BREAST REDUCTION  ;  Surgeon: Theodoro Kos, DO;  Location: [place];  Service: Plastics;  Laterality: Left;  . CESAREAN SECTION  07/15/2001; 03/18/2004; 11/21/2008  . lateral orbiotomy Left 11/2015   Dr. Toy Cookey at [facility]  . LATISSIMUS FLAP TO BREAST Right 03/12/2013   Procedure: RIGHT BREAST LATISSIMUS FLAP WITH EXPANDER PLACEMENT;  Surgeon: Theodoro Kos, DO;  Location: [place];  Service: Plastics;  Laterality:  Right;  . LIPOSUCTION Bilateral 08/23/2013   Procedure: LIPOSUCTION;  Surgeon: Theodoro Kos, DO;  Location: [place];  Service: Plastics;  Laterality: Bilateral;  . MASTECTOMY Right 2011  . MODIFIED RADICAL MASTECTOMY Right 03/11/2010  . NASAL SEPTUM SURGERY    . PORT-A-CATH REMOVAL Left 12/01/2010  . PORTACATH PLACEMENT Left 09/12/2009  . REDUCTION MAMMAPLASTY Left 08/2013  . REMOVAL OF TISSUE EXPANDER AND PLACEMENT OF IMPLANT Right 08/23/2013   Procedure: REMOVAL RIGHT TISSUE EXPANDER AND PLACEMENT OF IMPLANT TO RIGHT BREAST ;  Surgeon: Theodoro Kos, DO;  Location: [place];  Service: Plastics;  Laterality: Right;  . SUPRA-UMBILICAL HERNIA  3810  . TUBAL LIGATION  11/21/2008   Past Medical History:  Diagnosis Date  . Anxiety   . Cancer ([place]) 2011   Rt. Br. Ca  . Depression   . Essential hypertension 11/05/2015  . History of breast cancer 2011   right  . History of chemotherapy 2011  . History of radiation therapy 2011  . Hypertension    under control with med., has been on med. x 2 yr.  . Lymphedema of arm    right; no BP or puncture to right arm  . Personal history of chemotherapy 09/16/2009   rt breast  . Personal history of radiation therapy 05/2010   rt breast  . Pneumonia   . Seasonal allergies   . Sinus headache    LMP 07/22/2014 (Approximate)   Opioid Risk Score:   Fall Risk Score:  `1  Depression screen PHQ 2/9  Depression screen [facility] 2/9 06/06/2018 04/27/2018 04/10/2018 01/26/2018 12/19/2017 08/23/2017 08/31/2016  Decreased Interest 2 1 0 3 3 1  0  Down, Depressed, Hopeless 2 1 0 3 3 1  0  PHQ - 2 Score 4 2 0 6 6 2  0  Some recent data might be hidden     Review of Systems  Constitutional: Negative.   HENT: Negative.   Eyes: Negative.   Respiratory: Negative.   Cardiovascular: Negative.   Gastrointestinal: Negative.   Endocrine: Negative.   Genitourinary: Negative.   Musculoskeletal: Positive for arthralgias and myalgias.    Skin: Negative.   Allergic/Immunologic: Negative.   Neurological: Positive for numbness.  Hematological: Negative.   Psychiatric/Behavioral: Negative.   All other systems reviewed and are negative.      Objective:   Physical Exam Vitals signs and nursing note reviewed.  Constitutional:      Appearance: Normal appearance.  Neck:     Musculoskeletal: Normal range of motion and neck supple.  Cardiovascular:     Rate and Rhythm: Normal rate and regular rhythm.     Pulses: Normal pulses.     Heart sounds: Normal heart sounds.  Pulmonary:     Effort: Pulmonary effort is normal.     Breath sounds: Normal breath sounds.  Musculoskeletal:     Comments: Normal Muscle Bulk and Muscle Testing Reveals:  Upper Extremities:Right: Decreased  ROM 45 Degrees and  and Muscle Strength  4/5 Left: Full ROM and Muscle Strength 5/5  Lumbar Paraspinal Tenderness: L-3-L-5 Lower Extremities: Full ROM and Muscle Strength 5/5 Arises from Table with Ease Narrow Based Gait   Skin:    General: Skin is warm and dry.  Neurological:     Mental Status: She is alert and oriented to [place], place, and time.  Psychiatric:        Mood and Affect: Mood normal.        Behavior: Behavior normal.        Assessment and Plan:  1.Myofascial pain syndrome chronic postoperative, as well as post radiation: S/P Breast Reconstruction Surgery x 2. Continueto Monitor..Continue current medication regime: 06/29/2018 Refilled: Oxycodone 5 mg one tablet every 6 hours as needed for severe pain #120.Continue HEP. We will continue the opioid monitoring program, this consists of regular clinic visits, examinations, urine drug screen, pill counts as well as use of [place] Controlled Substance Reporting System. 2. Neoplasm Related Pain/ Cervical radiculitis:Refilled: Gabapentin, educated on medication compliance. Continue to Monitor. 06/29/2018 3. Lymphedema of Right Upper Extremity: Oncology  Following.06/29/2018 4.Cervicalgia/Cervical Dystonia: Schedule forBotox on 07/13/2018. 06/29/2018. 5. Obesity: Continue with Healthy Diet and Exercise Regime.06/29/2018 6. Muscle Spasms: Continuecurrent medication regimeTizanidine.06/29/2018 7. Depression: Continue Spiritual Counseling. Refuses Psychology Referral at this time. Will call office if she changes her mind she states. No suicidal ideation. 06/29/2018 8.Chronic Bilateral Knee Pai/ OA: Continue VoltarenGel.06/29/2018 9.Chronic Midline/ Low Back Bain/ Bilateral Low Back Pain: Continue with HEP as Tolerated. Continue current medication regimen.06/29/2018. 10. Neuropathic Pain:ContinueGabapentin:06/29/2018 11. Opioid Induced Constipation: Movantik not on her Drug Formulary: Continue: Amitiza. 06/29/2018 12. Bilateral Greater Trochanter Bursitis:No complaints today.Continue HEP and Ice Therapy as needed.06/29/2018 13. Bilateral Knee Pain: No complaints today. Continue HEP as Tolerated. Continue to Monitor.06/29/2018 14. Memory Changes: Neurology Dr. Jaynee Eagles Following. No complaints today.Continue to Monitor. 06/29/2018  20 minutes of face to face patient care time was spent during this visit. All questions were encouraged and answered.   F/U in 1 month

## 2018-07-05 LAB — TOXASSURE SELECT,+ANTIDEPR,UR

## 2018-07-06 ENCOUNTER — Telehealth: Payer: Self-pay | Admitting: *Deleted

## 2018-07-06 NOTE — Telephone Encounter (Signed)
Urine drug screen for this encounter is consistent for prescribed medication, but is also positive for THC. 

## 2018-07-06 NOTE — Telephone Encounter (Signed)
Letter for  non narcotic, I can still do botox if she wants

## 2018-07-06 NOTE — Telephone Encounter (Signed)
Ask eunice to write taper,start at 5mg  QID  reduce by one pill per week over the next 4 wks

## 2018-07-06 NOTE — Telephone Encounter (Signed)
Should we provide a taper schedule? Or let her find another prescriber?

## 2018-07-07 NOTE — Telephone Encounter (Signed)
Taper directions written in letter that I have sent through [place] and mail as well. I have spoken with Rosell to let her know this is the plan.

## 2018-07-10 ENCOUNTER — Telehealth: Payer: Self-pay | Admitting: Oncology

## 2018-07-10 NOTE — Telephone Encounter (Signed)
Returned call to patient re appointment. Per patient she missed a lot of appointments and just needs to get back on schedule so she can know what she should be doing. Rescheduled missed October 2019 lab/fu. Patient wants to see GM only and was given lab/fu for 2/18 @ 2 pm.

## 2018-07-12 ENCOUNTER — Emergency Department (HOSPITAL_COMMUNITY): Payer: Medicare Other

## 2018-07-12 ENCOUNTER — Encounter (HOSPITAL_COMMUNITY): Payer: Self-pay | Admitting: Emergency Medicine

## 2018-07-12 ENCOUNTER — Emergency Department (HOSPITAL_COMMUNITY)
Admission: EM | Admit: 2018-07-12 | Discharge: 2018-07-13 | Disposition: A | Discharge status: Home / Self Care | Payer: Medicare Other | Attending: Emergency Medicine | Admitting: Emergency Medicine

## 2018-07-12 DIAGNOSIS — Z79899 Other long term (current) drug therapy: Secondary | ICD-10-CM | POA: Diagnosis not present

## 2018-07-12 DIAGNOSIS — Z87891 Personal history of nicotine dependence: Secondary | ICD-10-CM | POA: Diagnosis not present

## 2018-07-12 DIAGNOSIS — E119 Type 2 diabetes mellitus without complications: Secondary | ICD-10-CM | POA: Diagnosis not present

## 2018-07-12 DIAGNOSIS — Y9289 Other specified places as the place of occurrence of the external cause: Secondary | ICD-10-CM | POA: Diagnosis not present

## 2018-07-12 DIAGNOSIS — Y999 Unspecified external cause status: Secondary | ICD-10-CM | POA: Insufficient documentation

## 2018-07-12 DIAGNOSIS — S82852A Displaced trimalleolar fracture of left lower leg, initial encounter for closed fracture: Secondary | ICD-10-CM | POA: Insufficient documentation

## 2018-07-12 DIAGNOSIS — Z7984 Long term (current) use of oral hypoglycemic drugs: Secondary | ICD-10-CM | POA: Diagnosis not present

## 2018-07-12 DIAGNOSIS — X500XXA Overexertion from strenuous movement or load, initial encounter: Secondary | ICD-10-CM | POA: Diagnosis not present

## 2018-07-12 DIAGNOSIS — R52 Pain, unspecified: Secondary | ICD-10-CM | POA: Diagnosis not present

## 2018-07-12 DIAGNOSIS — W19XXXA Unspecified fall, initial encounter: Secondary | ICD-10-CM | POA: Diagnosis not present

## 2018-07-12 DIAGNOSIS — Y9389 Activity, other specified: Secondary | ICD-10-CM | POA: Diagnosis not present

## 2018-07-12 DIAGNOSIS — S8252XD Displaced fracture of medial malleolus of left tibia, subsequent encounter for closed fracture with routine healing: Secondary | ICD-10-CM | POA: Diagnosis not present

## 2018-07-12 DIAGNOSIS — I10 Essential (primary) hypertension: Secondary | ICD-10-CM | POA: Insufficient documentation

## 2018-07-12 DIAGNOSIS — S99912A Unspecified injury of left ankle, initial encounter: Secondary | ICD-10-CM | POA: Diagnosis present

## 2018-07-12 MED ORDER — ETOMIDATE 2 MG/ML IV SOLN
0.3000 mg/kg | Freq: Once | INTRAVENOUS | Status: DC
  Filled 2018-07-12: qty 20

## 2018-07-12 MED ORDER — ETOMIDATE 2 MG/ML IV SOLN
INTRAVENOUS | Status: AC | PRN
  Administered 2018-07-12: 10 mg via INTRAVENOUS

## 2018-07-12 MED ORDER — OXYCODONE-ACETAMINOPHEN 5-325 MG PO TABS: 1.0000 | ORAL_TABLET | Freq: Three times a day (TID) | ORAL | 0 refills | Status: DC | PRN

## 2018-07-12 MED ORDER — OXYCODONE-ACETAMINOPHEN 5-325 MG PO TABS
1.0000 | ORAL_TABLET | Freq: Once | ORAL | Status: AC
  Administered 2018-07-13: 1 via ORAL
  Filled 2018-07-12: qty 1

## 2018-07-12 MED ORDER — HYDROMORPHONE HCL 1 MG/ML IJ SOLN
0.5000 mg | Freq: Once | INTRAMUSCULAR | Status: AC
  Administered 2018-07-12: 0.5 mg via INTRAVENOUS
  Filled 2018-07-12: qty 1

## 2018-07-12 MED ORDER — PROPOFOL 10 MG/ML IV BOLUS
INTRAVENOUS | Status: AC
  Administered 2018-07-12: 70 mg
  Filled 2018-07-12: qty 20

## 2018-07-12 MED ORDER — MIDAZOLAM HCL 2 MG/2ML IJ SOLN
INTRAMUSCULAR | Status: AC | PRN
  Administered 2018-07-12: .5 mg via INTRAVENOUS
  Administered 2018-07-12: 1 mg via INTRAVENOUS

## 2018-07-12 MED ORDER — MIDAZOLAM HCL 2 MG/2ML IJ SOLN
4.0000 mg | Freq: Once | INTRAMUSCULAR | Status: DC
  Filled 2018-07-12: qty 4

## 2018-07-12 MED ORDER — MORPHINE SULFATE (PF) 4 MG/ML IV SOLN
4.0000 mg | Freq: Once | INTRAVENOUS | Status: AC
  Administered 2018-07-12: 4 mg via INTRAVENOUS
  Filled 2018-07-12: qty 1

## 2018-07-12 NOTE — ED Triage Notes (Signed)
Arrived via EMS from home, walking to neighbors house, slipped in mud and fell. Denies neck or back pain, have 10/10 pain in L ankle, very swollen. No obvious deformity, but hard to tell with edema. No bloodthinners. Can only use L arm, R arm is restricted due to hx of breast cancer.   -20 L AC, 200 mcg Fentanyl, reports moderate relief with Fentanyl  -Vitals  -BP 132/86 -HR 94 -RR 16 -99 % RA -CBG 122

## 2018-07-12 NOTE — Discharge Instructions (Signed)
You can take Tylenol or Ibuprofen as directed for pain. You can alternate Tylenol and Ibuprofen every 4 hours. If you take Tylenol at 1pm, then you can take Ibuprofen at 5pm. Then you can take Tylenol again at 9pm.   Take pain medications as directed for break through pain. Do not drive or operate machinery while taking this medication.   As we discussed, it is very important that you elevate the leg above heart level to help with swelling.  Do not get the splint wet.  Call the orthopedic doctor tomorrow morning to arrange a follow-up appointment.  Return to emergency department for any worsening pain, numbness of your toes, discoloration of your toes or any other worsening or concerning symptoms.

## 2018-07-12 NOTE — ED Notes (Signed)
Bed: WA24 Expected date:  Expected time:  Means of arrival:  Comments: EMS-fall 

## 2018-07-12 NOTE — ED Provider Notes (Signed)
[facility] DEPT Provider Note   CSN: [id number] Arrival date & time: 07/12/18  2043     History   Chief Complaint Chief Complaint  Patient presents with  . Ankle Injury    HPI Karen Hopkins is a 43 y.o. female past medical history of cancer, depression, hypertension who presents for evaluation of left ankle pain after mechanical fall that occurred just prior to ED arrival.  She reports that she was walking from her neighbor's house to her house in the rain and states that she slipped in the mud, causing her ankle to twist out.  She states she does not think she hit her head.  She denies any preceding chest pain, dizziness.  She reports she has not been able to ambulate or bear weight on the left lower extremity since the incident.  She states that pain is worse to the distal aspect of the left tib-fib, left ankle.  Patient received 200 mcg of fentanyl in route with minimal improvement in pain.  Patient states she has a little bit of numbness towards the ankle.  Patient denies any injury to arms, chest pain, difficulty breathing, abdominal pain, nausea/vomiting.  The history is provided by the patient.    Past Medical History:  Diagnosis Date  . Anxiety   . Cancer ([place]) 2011   Rt. Br. Ca  . Depression   . Essential hypertension 11/05/2015  . History of breast cancer 2011   right  . History of chemotherapy 2011  . History of radiation therapy 2011  . Hypertension    under control with med., has been on med. x 2 yr.  . Lymphedema of arm    right; no BP or puncture to right arm  . Personal history of chemotherapy 09/16/2009   rt breast  . Personal history of radiation therapy 05/2010   rt breast  . Pneumonia   . Seasonal allergies   . Sinus headache     Patient Active Problem List   Diagnosis Date Noted  . Lymphedema of right arm 02/27/2018  . Dyspnea 09/23/2016  . Eczema 08/31/2016  . Essential hypertension 11/05/2015  . Cervical  dystonia 08/25/2015  . Headache 07/09/2015  . Myofascial muscle pain 04/29/2015  . Adhesive capsulitis of right shoulder 04/29/2015  . Type 2 diabetes mellitus without complication ([place]) 75/17/0017  . Lower extremity edema 10/01/2014  . Malignant neoplasm of overlapping sites of right breast in female, estrogen receptor positive ([place]) 05/24/2014  . Myalgia and myositis 05/17/2014  . Neoplasm related pain 03/12/2014  . CN (constipation) 11/26/2013  . H/O reduction mammoplasty 10/30/2013  . Seizure ([place]) 10/06/2013  . Status post bilateral breast implants 08/31/2013  . S/P breast reconstruction, right 08/23/2013  . AC joint arthropathy 07/02/2013  . Routine adult health maintenance 05/29/2013  . Hypokalemia 05/21/2013  . Chronic pain 04/19/2013  . Elevated blood sugar 03/09/2013  . Acquired absence of breast and absent nipple 01/30/2013  . RHINOSINUSITIS, CHRONIC 04/08/2010  . Obesity 08/04/2006  . DEPRESSIVE DISORDER, NOS 08/04/2006    Past Surgical History:  Procedure Laterality Date  . BREAST BIOPSY Right 08/26/2009  . BREAST REDUCTION SURGERY Left 08/23/2013   Procedure: LEFT BREAST REDUCTION  ;  Surgeon: Theodoro Kos, DO;  Location: [facility];  Service: Plastics;  Laterality: Left;  . CESAREAN SECTION  07/15/2001; 03/18/2004; 11/21/2008  . lateral orbiotomy Left 11/2015   Dr. Toy Cookey at [facility]  . LATISSIMUS FLAP TO BREAST Right 03/12/2013   Procedure:  RIGHT BREAST LATISSIMUS FLAP WITH EXPANDER PLACEMENT;  Surgeon: Theodoro Kos, DO;  Location: [facility];  Service: Plastics;  Laterality: Right;  . LIPOSUCTION Bilateral 08/23/2013   Procedure: LIPOSUCTION;  Surgeon: Theodoro Kos, DO;  Location: [facility];  Service: Plastics;  Laterality: Bilateral;  . MASTECTOMY Right 2011  . MODIFIED RADICAL MASTECTOMY Right 03/11/2010  . NASAL SEPTUM SURGERY    . PORT-A-CATH REMOVAL Left 12/01/2010  . PORTACATH PLACEMENT Left 09/12/2009  . REDUCTION MAMMAPLASTY Left  08/2013  . REMOVAL OF TISSUE EXPANDER AND PLACEMENT OF IMPLANT Right 08/23/2013   Procedure: REMOVAL RIGHT TISSUE EXPANDER AND PLACEMENT OF IMPLANT TO RIGHT BREAST ;  Surgeon: Theodoro Kos, DO;  Location: [facility];  Service: Plastics;  Laterality: Right;  . SUPRA-UMBILICAL HERNIA  2119  . TUBAL LIGATION  11/21/2008     OB History    Gravida  3   Para  3   Term  2   Preterm  1   AB      Living  2     SAB      TAB      Ectopic      Multiple      Live Births  2            Home Medications    Prior to Admission medications   Medication Sig Start Date End Date Taking? Authorizing Provider  AMITIZA 8 MCG capsule TAKE ONE CAPSULE BY MOUTH DAILY WITH BREAKFAST Patient taking differently: Take 8 mcg by mouth daily with breakfast.  05/01/18  Yes Danella Sensing L, NP  diclofenac sodium (VOLTAREN) 1 % GEL Apply 4 g topically 4 (four) times daily. Use as directed 09/19/17  Yes Danella Sensing L, NP  oxyCODONE (ROXICODONE) 5 MG immediate release tablet Take 5 mg by mouth every 6 (six) hours as needed for severe pain.   Yes [provider]  tiZANidine (ZANAFLEX) 4 MG tablet TAKE ONE (1) TABLET BY MOUTH 3 TIMES DAILY 02/27/18  Yes Kirsteins, Luanna Salk, MD  topiramate (TOPAMAX) 50 MG tablet Take 3 tablets (150 mg total) by mouth at bedtime. 06/09/18  Yes Venancio Poisson, NP  zolpidem (AMBIEN) 10 MG tablet Take 1 tablet by mouth at bedtime as needed for sleep.  12/14/17  Yes [provider]  albuterol (PROAIR HFA) 108 (90 Base) MCG/ACT inhaler Inhale 2 puffs into the lungs every 6 (six) hours as needed for wheezing or shortness of breath. Patient not taking: Reported on 07/12/2018 10/12/16   [name], MD  albuterol (PROVENTIL) (2.5 MG/3ML) 0.083% nebulizer solution Take 3 mLs (2.5 mg total) by nebulization every 6 (six) hours as needed for wheezing or shortness of breath. Patient not taking: Reported on 07/12/2018 08/03/16   [name],  MD  anastrozole (ARIMIDEX) 1 MG tablet Take 1 tablet (1 mg total) by mouth daily. Patient not taking: Reported on 07/12/2018 09/07/16   Magrinat, Virgie Dad, MD  carvedilol (COREG) 6.25 MG tablet TAKE ONE (1) TABLET BY MOUTH TWO (2) TIMES DAILY WITH A MEAL Patient not taking: Reported on 07/12/2018 06/15/17   Skeet Latch, MD  FLOVENT HFA 220 MCG/ACT inhaler INHALE TWO PUFFS INTO THE LUNGS TWICE DAILY Patient taking differently: Inhale 2 puffs into the lungs.  11/15/17   [name], MD  furosemide (LASIX) 40 MG tablet TAKE ONE (1) TABLET BY MOUTH EVERY DAY Patient not taking: No sig reported 01/30/18   Skeet Latch, MD  gabapentin (NEURONTIN) 100 MG capsule  Take 1 capsule (100 mg total) by mouth 3 (three) times daily. Patient not taking: Reported on 07/12/2018 09/19/17   Bayard Hugger, NP  metFORMIN (GLUCOPHAGE) 500 MG tablet TAKE ONE (1) TABLET BY MOUTH TWO (2) TIMES DAILY WITH A MEAL Patient not taking: No sig reported 01/30/18   Alveda Reasons, MD  metoCLOPramide (REGLAN) 10 MG tablet Take 1 tablet (10 mg total) by mouth every 8 (eight) hours as needed. Patient not taking: Reported on 07/12/2018 09/01/15   Melvenia Beam, MD  oxyCODONE-acetaminophen (PERCOCET/ROXICET) 5-325 MG tablet Take 1-2 tablets by mouth every 8 (eight) hours as needed for up to 5 days for severe pain. 07/12/18 07/17/18  Providence Lanius A, PA-C  polyethylene glycol powder (GLYCOLAX) powder Take 17 g by mouth daily. Patient not taking: Reported on 07/12/2018 05/23/17   Magrinat, Virgie Dad, MD  potassium chloride SA (K-DUR,KLOR-CON) 20 MEQ tablet Take 1 tablet (20 mEq total) by mouth 2 (two) times daily. Patient not taking: Reported on 07/12/2018 08/24/17   Horton, Barbette Hair, MD  triamcinolone ointment (KENALOG) 0.5 % Apply 1 application topically 2 (two) times daily. Patient not taking: Reported on 07/12/2018 03/23/17   [name], MD    Family History Family History  Problem Relation Age of Onset  .  Hypertension Mother   . Breast cancer Mother 68  . Diabetes type II Father   . Prostate cancer Father   . Stroke Neg Hx     Social History Social History   Tobacco Use  . Smoking status: Former Smoker    Packs/day: 0.25    Years: 5.00    Pack years: 1.25    Types: Cigarettes    Start date: 06/07/2002    Last attempt to quit: 04/26/2010    Years since quitting: 8.2  . Smokeless tobacco: Never Used  Substance Use Topics  . Alcohol use: No  . Drug use: No     Allergies   Penicillins; Lyrica [pregabalin]; Meloxicam; and Robaxin [methocarbamol]   Review of Systems Review of Systems  Constitutional: Negative for fever.  Cardiovascular: Negative for chest pain.  Gastrointestinal: Negative for abdominal pain, nausea and vomiting.  Musculoskeletal:       Left ankle pain  Neurological: Positive for numbness. Negative for headaches.  All other systems reviewed and are negative.    Physical Exam Updated Vital Signs BP (!) 149/97   Pulse 77   Temp 97.6 F (36.4 C) (Oral)   Resp (!) 22   LMP 07/22/2014 (Approximate)   SpO2 100%   Physical Exam Vitals signs and nursing note reviewed.  Constitutional:      Appearance: She is well-developed.  HENT:     Head: Normocephalic and atraumatic.     Comments: No tenderness to palpation of skull. No deformities or crepitus noted. No open wounds, abrasions or lacerations.  Eyes:     General: No scleral icterus.       Right eye: No discharge.        Left eye: No discharge.     Conjunctiva/sclera: Conjunctivae normal.     Comments: PERRL  Cardiovascular:     Pulses:          Radial pulses are 2+ on the right side and 2+ on the left side.       Dorsalis pedis pulses are 2+ on the right side and 2+ on the left side.  Pulmonary:     Effort: Pulmonary effort is normal.  Musculoskeletal:  Left ankle: She exhibits swelling and deformity. Tenderness.     Comments: There is palpation noted to left ankle, left distal tib-fib with  overlying soft tissue swelling.  Obvious deformity noted.  No crepitus.  Patient able to wiggle her toes minimally.  Patient with no tenderness palpation of the left knee, left hip.  No tenderness palpation noted to right lower extremity.  Full range of motion of right lower extremity intact without difficulty.  Skin:    General: Skin is warm and dry.     Comments: Good distal cap refill.  LLE is not dusky in appearance or cool to touch.  Neurological:     Mental Status: She is alert.     Comments: Mild numbness noted to the left ankle.  Patient can feel me touching her distal toes.  Psychiatric:        Speech: Speech normal.        Behavior: Behavior normal.      ED Treatments / Results  Labs (all labs ordered are listed, but only abnormal results are displayed) Labs Reviewed - No data to display  EKG None  Radiology Dg Tibia/fibula Left  Result Date: 07/12/2018 CLINICAL DATA:  Slip and fall injury with twisting ankle. Swollen and painful ankle with obvious deformity. EXAM: LEFT ANKLE COMPLETE - 3+ VIEW; LEFT TIBIA AND FIBULA - 2 VIEW COMPARISON:  Left knee 11/03/2016 FINDINGS: Three views of the left ankle and four views of the left tibia fibula are obtained. Transverse fracture of the medial malleolus of the left ankle with about 5 mm lateral displacement of the distal fracture fragment and of the talus with respect to the tibia. Oblique fracture of the distal fibula with lateral angulation and displacement of the distal fracture fragment. Coronal fracture of the posterior malleolus with posterior and superior displacement of the fracture fragment. Posterior dislocation of the talus with respect to the tibia. Diffuse soft tissue swelling. Proximal left tibia and fibula appear otherwise unremarkable. IMPRESSION: Trimalleolar fracture dislocation of the left ankle. Electronically Signed   By: Lucienne Capers M.D.   On: 07/12/2018 22:08   Dg Ankle 2 Views Left  Result Date:  07/12/2018 CLINICAL DATA:  43 year old female status post reduction. EXAM: LEFT ANKLE - 2 VIEW COMPARISON:  Left ankle series 2107 hours today. FINDINGS: AP and cross-table lateral views of the left ankle with cast or splint material in place. Trimalleolar fracture demonstrated on the earlier images demonstrates substantially improved alignment. Mild residual lateral subluxation suspected. No new osseous abnormality identified. IMPRESSION: Significantly improved alignment about the left ankle trimalleolar fracture status post cast or splint placement. Electronically Signed   By: Genevie Ann M.D.   On: 07/12/2018 23:35   Dg Ankle Complete Left  Result Date: 07/12/2018 CLINICAL DATA:  Slip and fall injury with twisting ankle. Swollen and painful ankle with obvious deformity. EXAM: LEFT ANKLE COMPLETE - 3+ VIEW; LEFT TIBIA AND FIBULA - 2 VIEW COMPARISON:  Left knee 11/03/2016 FINDINGS: Three views of the left ankle and four views of the left tibia fibula are obtained. Transverse fracture of the medial malleolus of the left ankle with about 5 mm lateral displacement of the distal fracture fragment and of the talus with respect to the tibia. Oblique fracture of the distal fibula with lateral angulation and displacement of the distal fracture fragment. Coronal fracture of the posterior malleolus with posterior and superior displacement of the fracture fragment. Posterior dislocation of the talus with respect to the tibia. Diffuse soft tissue  swelling. Proximal left tibia and fibula appear otherwise unremarkable. IMPRESSION: Trimalleolar fracture dislocation of the left ankle. Electronically Signed   By: Lucienne Capers M.D.   On: 07/12/2018 22:08    Procedures Reduction of dislocation Date/Time: 07/12/2018 11:22 PM Performed by: Volanda Napoleon, PA-C Authorized by: Volanda Napoleon, PA-C  Consent: Verbal consent obtained. Risks and benefits: risks, benefits and alternatives were discussed Consent given by:  patient Patient understanding: patient states understanding of the procedure being performed Patient consent: the patient's understanding of the procedure matches consent given Procedure consent: procedure consent matches procedure scheduled Relevant documents: relevant documents present and verified Test results: test results available and properly labeled Site marked: the operative site was marked Imaging studies: imaging studies available Patient identity confirmed: verbally with patient Time out: Immediately prior to procedure a "time out" was called to verify the correct patient, procedure, equipment, support staff and site/side marked as required. Preparation: Patient was prepped and draped in the usual sterile fashion.  Sedation: Patient sedated: yes Sedation type: moderate (conscious) sedation Sedatives: etomidate, propofol and midazolam  Patient tolerance: Patient tolerated the procedure well with no immediate complications Comments: Please see sedation note in EDP notes.     (including critical care time)  Medications Ordered in ED Medications  etomidate (AMIDATE) injection 28.86 mg (has no administration in time range)  midazolam (VERSED) injection 4 mg (has no administration in time range)  oxyCODONE-acetaminophen (PERCOCET/ROXICET) 5-325 MG per tablet 1 tablet (has no administration in time range)  morphine 4 MG/ML injection 4 mg (4 mg Intravenous Given 07/12/18 2126)  midazolam (VERSED) injection (0.5 mg Intravenous Given 07/12/18 2256)  etomidate (AMIDATE) injection (10 mg Intravenous Given 07/12/18 2255)  propofol (DIPRIVAN) 10 mg/mL bolus/IV push (70 mg  Given 07/12/18 2258)  HYDROmorphone (DILAUDID) injection 0.5 mg (0.5 mg Intravenous Given 07/12/18 2347)     Initial Impression / Assessment and Plan / ED Course  I have reviewed the triage vital signs and the nursing notes.  Pertinent labs & imaging results that were available during my care of the patient were reviewed  by me and considered in my medical decision making (see chart for details).     43 year old female who presents for evaluation of left ankle pain after mechanical fall that occurred earlier this evening. No head injury, LOC. Patient is afebril, non-toxic appearing, sitting comfortably on examination table. Vital signs reviewed and stable.  Patient is neurovascularly intact.  On exam, patient with tenderness, swelling noted to the left ankle with deformity noted.  Patient with good distal pulses, good cap refill.  Will plan for x-rays.   X-ray shows trimalleolar fracture dislocation of the left ankle.  Plan for reduction here in the ED.  Reduction as documented above.  Postreduction films show normal alignment of ankle.  Successful reduction.  Discussed results with patient.  Evaluation after splint placement shows good distal cap refill, sensation.  She is alert and oriented and speaking without any difficulty.  Vitals stable.  Patient reports she still having some pain.  We will give her additional analgesics.  Encourage at home supportive care measures. Plan to provide outpatient orthopedic referral for further follow-up and evaluation. Patient stable for discharge at this time. Patient had ample opportunity for questions and discussion. All patient's questions were answered with full understanding. Strict return precautions discussed. Patient expresses understanding and agreement to plan.   Portions of this note were generated with [employer]. Dictation errors may occur despite best attempts at proofreading.  Final Clinical Impressions(s) / ED Diagnoses   Final diagnoses:  Closed trimalleolar fracture of left ankle, initial encounter    ED Discharge Orders         Ordered    oxyCODONE-acetaminophen (PERCOCET/ROXICET) 5-325 MG tablet  Every 8 hours PRN     07/12/18 2347           Volanda Napoleon, PA-C 07/13/18 0000    Isla Pence, MD 07/13/18 1515

## 2018-07-13 ENCOUNTER — Ambulatory Visit: Payer: Medicare Other | Admitting: Physical Medicine & Rehabilitation

## 2018-07-13 ENCOUNTER — Encounter: Payer: Medicare Other | Attending: Registered Nurse

## 2018-07-13 DIAGNOSIS — S82852A Displaced trimalleolar fracture of left lower leg, initial encounter for closed fracture: Secondary | ICD-10-CM | POA: Diagnosis not present

## 2018-07-13 DIAGNOSIS — I890 Lymphedema, not elsewhere classified: Secondary | ICD-10-CM | POA: Insufficient documentation

## 2018-07-13 DIAGNOSIS — M546 Pain in thoracic spine: Secondary | ICD-10-CM | POA: Insufficient documentation

## 2018-07-13 NOTE — ED Provider Notes (Signed)
Physical Exam  BP (!) 148/101 (BP Location: Left Arm)   Pulse 74   Temp 97.6 F (36.4 C) (Oral)   Resp 18   LMP 07/22/2014 (Approximate)   SpO2 100%   Physical Exam  ED Course/Procedures     .Sedation Date/Time: 07/13/2018 3:15 PM Performed by: Isla Pence, MD Authorized by: Isla Pence, MD   Consent:    Consent obtained:  Written   Consent given by:  Patient   Risks discussed:  Allergic reaction, dysrhythmia, inadequate sedation, nausea, prolonged hypoxia resulting in organ damage, prolonged sedation necessitating reversal, respiratory compromise necessitating ventilatory assistance and intubation and vomiting   Alternatives discussed:  Analgesia without sedation, anxiolysis and regional anesthesia Universal protocol:    Procedure explained and questions answered to patient or proxy's satisfaction: yes     Relevant documents present and verified: yes     Test results available and properly labeled: yes     Imaging studies available: yes     Required blood products, implants, devices, and special equipment available: yes     Site/side marked: yes     Immediately prior to procedure a time out was called: yes     Patient identity confirmation method:  Verbally with patient Indications:    Procedure necessitating sedation performed by:  Physician performing sedation Pre-sedation assessment:    Time since last food or drink:  5   ASA classification: class 2 - patient with mild systemic disease     Neck mobility: normal     Mouth opening:  3 or more finger widths   Thyromental distance:  4 finger widths   Mallampati score:  I - soft palate, uvula, fauces, pillars visible   Pre-sedation assessments completed and reviewed: airway patency, cardiovascular function, hydration status, mental status, nausea/vomiting, pain level, respiratory function and temperature   Immediate pre-procedure details:    Reassessment: Patient reassessed immediately prior to procedure      Reviewed: vital signs, relevant labs/tests and NPO status     Verified: bag valve mask available, emergency equipment available, intubation equipment available, IV patency confirmed, oxygen available and suction available   Procedure details (see MAR for exact dosages):    Preoxygenation:  Nasal cannula   Sedation:  Propofol   Intra-procedure monitoring:  Blood pressure monitoring, cardiac monitor, continuous pulse oximetry, frequent LOC assessments, frequent vital sign checks and continuous capnometry   Intra-procedure events: none     Total Provider sedation time (minutes):  40 Post-procedure details:    Post-sedation assessment completed:  07/12/2018 11:30 PM   Attendance: Constant attendance by certified staff until patient recovered     Recovery: Patient returned to pre-procedure baseline     Post-sedation assessments completed and reviewed: airway patency, cardiovascular function, hydration status, mental status, nausea/vomiting, pain level, respiratory function and temperature     Patient is stable for discharge or admission: yes     Patient tolerance:  Tolerated well, no immediate complications Comments:     Pt initially given 10 mg of etomidate and 1.5 mg of versed IV which did not get pt relaxed enough to reduce ankle.  After propofol, ankle reduced and splint placed. Marland KitchenSplint Application Date/Time: 11/08/1495 3:16 PM Performed by: Isla Pence, MD Authorized by: Isla Pence, MD   Consent:    Consent obtained:  Verbal   Consent given by:  Patient   Risks discussed:  Discoloration, swelling and pain   Alternatives discussed:  No treatment Pre-procedure details:    Sensation:  Normal Procedure details:  Laterality:  Left   Location:  Ankle   Ankle:  L ankle   Splint type:  Sugar tong   Supplies:  Cotton padding and plaster Post-procedure details:    Pain:  Improved   Sensation:  Normal   Patient tolerance of procedure:  Tolerated well, no immediate  complications .Splint Application Date/Time: 06/10/2393 3:17 PM Performed by: Isla Pence, MD Authorized by: Isla Pence, MD   Consent:    Consent obtained:  Verbal   Consent given by:  Patient   Risks discussed:  Discoloration, pain, swelling and numbness   Alternatives discussed:  No treatment Pre-procedure details:    Sensation:  Normal Procedure details:    Laterality:  Left   Location:  Ankle   Ankle:  L ankle   Splint type:  Short leg   Supplies:  Cotton padding and plaster Post-procedure details:    Pain:  Improved   Sensation:  Normal   Patient tolerance of procedure:  Tolerated well, no immediate complications    MDM  Reduction done by PA Layden under my direct supervision.       Isla Pence, MD 07/13/18 1517

## 2018-07-14 ENCOUNTER — Other Ambulatory Visit: Payer: Self-pay | Admitting: Orthopaedic Surgery

## 2018-07-14 DIAGNOSIS — S82842A Displaced bimalleolar fracture of left lower leg, initial encounter for closed fracture: Secondary | ICD-10-CM | POA: Diagnosis not present

## 2018-07-17 ENCOUNTER — Ambulatory Visit: Payer: Medicare Other | Admitting: Obstetrics & Gynecology

## 2018-07-17 ENCOUNTER — Telehealth: Payer: Self-pay | Admitting: *Deleted

## 2018-07-17 ENCOUNTER — Other Ambulatory Visit: Payer: Self-pay

## 2018-07-17 ENCOUNTER — Telehealth: Payer: Self-pay | Admitting: Obstetrics & Gynecology

## 2018-07-17 ENCOUNTER — Encounter (HOSPITAL_BASED_OUTPATIENT_CLINIC_OR_DEPARTMENT_OTHER): Payer: Self-pay | Admitting: *Deleted

## 2018-07-17 NOTE — Telephone Encounter (Signed)
Called patient because she missed her AEX today with Dr. Sabra Heck. She said she is not doing well as she has a broken ankle and is having surgery tomorrow. Patient apologetic for missing her appointment and will call back to reschedule when she's feeling better. Patient in 08 recall.

## 2018-07-17 NOTE — Telephone Encounter (Signed)
Karen Hopkins from [facility] called about [facility] having surgery tomorrow and if she is contracted and how we want to handle her pain medication.  I called back and let Karen Hopkins know that she is no longer under contract with [place] and we are no longer prescribing pain medication for her.

## 2018-07-18 ENCOUNTER — Ambulatory Visit (HOSPITAL_COMMUNITY): Payer: Medicare Other

## 2018-07-18 ENCOUNTER — Encounter (HOSPITAL_BASED_OUTPATIENT_CLINIC_OR_DEPARTMENT_OTHER)
Admission: RE | Disposition: A | Discharge status: Home / Self Care | Payer: Self-pay | Source: Home / Self Care | Attending: Orthopaedic Surgery

## 2018-07-18 ENCOUNTER — Encounter (HOSPITAL_BASED_OUTPATIENT_CLINIC_OR_DEPARTMENT_OTHER): Payer: Self-pay

## 2018-07-18 ENCOUNTER — Ambulatory Visit (HOSPITAL_BASED_OUTPATIENT_CLINIC_OR_DEPARTMENT_OTHER)
Admission: RE | Admit: 2018-07-18 | Discharge: 2018-07-18 | Disposition: A | Discharge status: Home / Self Care | Payer: Medicare Other | Attending: Orthopaedic Surgery | Admitting: Orthopaedic Surgery

## 2018-07-18 ENCOUNTER — Ambulatory Visit (HOSPITAL_BASED_OUTPATIENT_CLINIC_OR_DEPARTMENT_OTHER): Payer: Medicare Other | Admitting: Certified Registered"

## 2018-07-18 DIAGNOSIS — R269 Unspecified abnormalities of gait and mobility: Secondary | ICD-10-CM | POA: Diagnosis not present

## 2018-07-18 DIAGNOSIS — F329 Major depressive disorder, single episode, unspecified: Secondary | ICD-10-CM | POA: Diagnosis not present

## 2018-07-18 DIAGNOSIS — M24072 Loose body in left ankle: Secondary | ICD-10-CM | POA: Diagnosis not present

## 2018-07-18 DIAGNOSIS — Z87891 Personal history of nicotine dependence: Secondary | ICD-10-CM | POA: Diagnosis not present

## 2018-07-18 DIAGNOSIS — S92142A Displaced dome fracture of left talus, initial encounter for closed fracture: Secondary | ICD-10-CM | POA: Diagnosis not present

## 2018-07-18 DIAGNOSIS — S82852A Displaced trimalleolar fracture of left lower leg, initial encounter for closed fracture: Secondary | ICD-10-CM | POA: Diagnosis not present

## 2018-07-18 DIAGNOSIS — Z9221 Personal history of antineoplastic chemotherapy: Secondary | ICD-10-CM | POA: Insufficient documentation

## 2018-07-18 DIAGNOSIS — I10 Essential (primary) hypertension: Secondary | ICD-10-CM | POA: Insufficient documentation

## 2018-07-18 DIAGNOSIS — Z833 Family history of diabetes mellitus: Secondary | ICD-10-CM | POA: Diagnosis not present

## 2018-07-18 DIAGNOSIS — F419 Anxiety disorder, unspecified: Secondary | ICD-10-CM | POA: Insufficient documentation

## 2018-07-18 DIAGNOSIS — Z8042 Family history of malignant neoplasm of prostate: Secondary | ICD-10-CM | POA: Insufficient documentation

## 2018-07-18 DIAGNOSIS — S8252XA Displaced fracture of medial malleolus of left tibia, initial encounter for closed fracture: Secondary | ICD-10-CM | POA: Diagnosis not present

## 2018-07-18 DIAGNOSIS — Y9301 Activity, walking, marching and hiking: Secondary | ICD-10-CM | POA: Diagnosis not present

## 2018-07-18 DIAGNOSIS — W19XXXA Unspecified fall, initial encounter: Secondary | ICD-10-CM | POA: Insufficient documentation

## 2018-07-18 DIAGNOSIS — Z6835 Body mass index (BMI) 35.0-35.9, adult: Secondary | ICD-10-CM | POA: Diagnosis not present

## 2018-07-18 DIAGNOSIS — Z419 Encounter for procedure for purposes other than remedying health state, unspecified: Secondary | ICD-10-CM

## 2018-07-18 DIAGNOSIS — Z853 Personal history of malignant neoplasm of breast: Secondary | ICD-10-CM | POA: Diagnosis not present

## 2018-07-18 DIAGNOSIS — Z8249 Family history of ischemic heart disease and other diseases of the circulatory system: Secondary | ICD-10-CM | POA: Diagnosis not present

## 2018-07-18 DIAGNOSIS — Z923 Personal history of irradiation: Secondary | ICD-10-CM | POA: Diagnosis not present

## 2018-07-18 DIAGNOSIS — M93972 Osteochondropathy, unspecified, left ankle and foot: Secondary | ICD-10-CM | POA: Insufficient documentation

## 2018-07-18 DIAGNOSIS — M93272 Osteochondritis dissecans, left ankle and joints of left foot: Secondary | ICD-10-CM | POA: Diagnosis not present

## 2018-07-18 DIAGNOSIS — G8918 Other acute postprocedural pain: Secondary | ICD-10-CM | POA: Diagnosis not present

## 2018-07-18 DIAGNOSIS — Z79899 Other long term (current) drug therapy: Secondary | ICD-10-CM | POA: Diagnosis not present

## 2018-07-18 DIAGNOSIS — Z803 Family history of malignant neoplasm of breast: Secondary | ICD-10-CM | POA: Insufficient documentation

## 2018-07-18 DIAGNOSIS — Z9012 Acquired absence of left breast and nipple: Secondary | ICD-10-CM | POA: Insufficient documentation

## 2018-07-18 DIAGNOSIS — S8252XD Displaced fracture of medial malleolus of left tibia, subsequent encounter for closed fracture with routine healing: Secondary | ICD-10-CM | POA: Diagnosis not present

## 2018-07-18 DIAGNOSIS — S93432A Sprain of tibiofibular ligament of left ankle, initial encounter: Secondary | ICD-10-CM | POA: Insufficient documentation

## 2018-07-18 DIAGNOSIS — Z791 Long term (current) use of non-steroidal anti-inflammatories (NSAID): Secondary | ICD-10-CM | POA: Diagnosis not present

## 2018-07-18 HISTORY — PX: ORIF ANKLE FRACTURE: SHX5408

## 2018-07-18 SURGERY — OPEN REDUCTION INTERNAL FIXATION (ORIF) ANKLE FRACTURE
Anesthesia: General | Site: Ankle | Laterality: Left

## 2018-07-18 MED ORDER — MIDAZOLAM HCL 2 MG/2ML IJ SOLN
1.0000 mg | INTRAMUSCULAR | Status: DC | PRN
  Administered 2018-07-18 (×2): 2 mg via INTRAVENOUS

## 2018-07-18 MED ORDER — SCOPOLAMINE 1 MG/3DAYS TD PT72
MEDICATED_PATCH | TRANSDERMAL | Status: AC
  Filled 2018-07-18: qty 1

## 2018-07-18 MED ORDER — MIDAZOLAM HCL 2 MG/2ML IJ SOLN
INTRAMUSCULAR | Status: AC
  Filled 2018-07-18: qty 2

## 2018-07-18 MED ORDER — SCOPOLAMINE 1 MG/3DAYS TD PT72: 1.0000 | MEDICATED_PATCH | Freq: Once | TRANSDERMAL | Status: DC | PRN

## 2018-07-18 MED ORDER — BUPIVACAINE-EPINEPHRINE (PF) 0.5% -1:200000 IJ SOLN
INTRAMUSCULAR | Status: DC | PRN
  Administered 2018-07-18 (×2): 15 mL via PERINEURAL

## 2018-07-18 MED ORDER — ACETAMINOPHEN 500 MG PO TABS
1000.0000 mg | ORAL_TABLET | Freq: Once | ORAL | Status: AC
  Administered 2018-07-18: 1000 mg via ORAL

## 2018-07-18 MED ORDER — ACETAMINOPHEN 500 MG PO TABS
ORAL_TABLET | ORAL | Status: AC
  Filled 2018-07-18: qty 2

## 2018-07-18 MED ORDER — FENTANYL CITRATE (PF) 100 MCG/2ML IJ SOLN
50.0000 ug | INTRAMUSCULAR | Status: AC | PRN
  Administered 2018-07-18 (×2): 25 ug via INTRAVENOUS
  Administered 2018-07-18: 50 ug via INTRAVENOUS
  Administered 2018-07-18: 100 ug via INTRAVENOUS

## 2018-07-18 MED ORDER — FENTANYL CITRATE (PF) 100 MCG/2ML IJ SOLN: 25.0000 ug | INTRAMUSCULAR | Status: DC | PRN

## 2018-07-18 MED ORDER — ONDANSETRON HCL 4 MG/2ML IJ SOLN
INTRAMUSCULAR | Status: DC | PRN
  Administered 2018-07-18: 4 mg via INTRAVENOUS

## 2018-07-18 MED ORDER — BUPIVACAINE LIPOSOME 1.3 % IJ SUSP
INTRAMUSCULAR | Status: DC | PRN
  Administered 2018-07-18: 10 mL via PERINEURAL

## 2018-07-18 MED ORDER — FENTANYL CITRATE (PF) 100 MCG/2ML IJ SOLN
INTRAMUSCULAR | Status: AC
  Filled 2018-07-18: qty 2

## 2018-07-18 MED ORDER — ASPIRIN 325 MG PO TABS: 325.0000 mg | ORAL_TABLET | Freq: Every day | ORAL | 11 refills | Status: DC

## 2018-07-18 MED ORDER — LACTATED RINGERS IV SOLN
INTRAVENOUS | Status: DC
  Administered 2018-07-18 (×3): via INTRAVENOUS

## 2018-07-18 MED ORDER — SCOPOLAMINE 1 MG/3DAYS TD PT72
1.0000 | MEDICATED_PATCH | TRANSDERMAL | Status: DC
  Administered 2018-07-18: 1.5 mg via TRANSDERMAL

## 2018-07-18 MED ORDER — OXYCODONE HCL 5 MG PO TABS: 5.0000 mg | ORAL_TABLET | ORAL | 0 refills | Status: AC | PRN

## 2018-07-18 MED ORDER — PROPOFOL 500 MG/50ML IV EMUL
INTRAVENOUS | Status: DC | PRN
  Administered 2018-07-18: 25 ug/kg/min via INTRAVENOUS

## 2018-07-18 MED ORDER — CLINDAMYCIN PHOSPHATE 900 MG/50ML IV SOLN
900.0000 mg | INTRAVENOUS | Status: AC
  Administered 2018-07-18: 900 mg via INTRAVENOUS

## 2018-07-18 MED ORDER — PROMETHAZINE HCL 25 MG/ML IJ SOLN: 6.2500 mg | INTRAMUSCULAR | Status: DC | PRN

## 2018-07-18 MED ORDER — LABETALOL HCL 5 MG/ML IV SOLN
INTRAVENOUS | Status: DC | PRN
  Administered 2018-07-18: 5 mg via INTRAVENOUS

## 2018-07-18 MED ORDER — DEXAMETHASONE SODIUM PHOSPHATE 10 MG/ML IJ SOLN
INTRAMUSCULAR | Status: DC | PRN
  Administered 2018-07-18: 10 mg via INTRAVENOUS

## 2018-07-18 MED ORDER — POVIDONE-IODINE 10 % EX SWAB
2.0000 "application " | Freq: Once | CUTANEOUS | Status: AC
  Administered 2018-07-18: 2 via TOPICAL

## 2018-07-18 MED ORDER — CLINDAMYCIN PHOSPHATE 900 MG/50ML IV SOLN
INTRAVENOUS | Status: AC
  Filled 2018-07-18: qty 50

## 2018-07-18 MED ORDER — PROPOFOL 10 MG/ML IV BOLUS
INTRAVENOUS | Status: DC | PRN
  Administered 2018-07-18: 200 mg via INTRAVENOUS

## 2018-07-18 SURGICAL SUPPLY — 88 items
APL SKNCLS STERI-STRIP NONHPOA (GAUZE/BANDAGES/DRESSINGS)
BANDAGE ACE 4X5 VEL STRL LF (GAUZE/BANDAGES/DRESSINGS) ×3 IMPLANT
BANDAGE ACE 6X5 VEL STRL LF (GAUZE/BANDAGES/DRESSINGS) ×3 IMPLANT
BANDAGE ESMARK 6X9 LF (GAUZE/BANDAGES/DRESSINGS) ×1 IMPLANT
BENZOIN TINCTURE PRP APPL 2/3 (GAUZE/BANDAGES/DRESSINGS) IMPLANT
BIT DRILL 2 QR W/DEPTH MARKS (BIT) ×3
BIT DRILL 2.8 QR W/DEPTH MARKS (BIT) ×3 IMPLANT
BIT DRILL 27 CANN QC (BIT) ×3 IMPLANT
BIT DRILL 2MM QR W/DEPTH MARKS (BIT) ×1 IMPLANT
BIT DRILL LAG 3.5MM QUCK RELES (DRILL) ×1 IMPLANT
BLADE SURG 15 STRL LF DISP TIS (BLADE) ×3 IMPLANT
BLADE SURG 15 STRL SS (BLADE) ×9
BNDG CMPR 9X4 STRL LF SNTH (GAUZE/BANDAGES/DRESSINGS)
BNDG CMPR 9X6 STRL LF SNTH (GAUZE/BANDAGES/DRESSINGS) ×1
BNDG COHESIVE 4X5 TAN STRL (GAUZE/BANDAGES/DRESSINGS) IMPLANT
BNDG ESMARK 4X9 LF (GAUZE/BANDAGES/DRESSINGS) IMPLANT
BNDG ESMARK 6X9 LF (GAUZE/BANDAGES/DRESSINGS) ×3
CHLORAPREP W/TINT 26ML (MISCELLANEOUS) ×3 IMPLANT
CLOSURE WOUND 1/2 X4 (GAUZE/BANDAGES/DRESSINGS)
COVER BACK TABLE 60X90IN (DRAPES) ×3 IMPLANT
COVER MAYO STAND STRL (DRAPES) ×3 IMPLANT
COVER WAND RF STERILE (DRAPES) IMPLANT
CUFF TOURNIQUET SINGLE 34IN LL (TOURNIQUET CUFF) ×3 IMPLANT
CUFF TOURNIQUET SINGLE 44IN (TOURNIQUET CUFF) ×3 IMPLANT
DECANTER SPIKE VIAL GLASS SM (MISCELLANEOUS) IMPLANT
DRAPE C-ARM 42X72 X-RAY (DRAPES) ×3 IMPLANT
DRAPE C-ARMOR (DRAPES) ×3 IMPLANT
DRAPE EXTREMITY T 121X128X90 (DISPOSABLE) ×3 IMPLANT
DRAPE IMP U-DRAPE 54X76 (DRAPES) ×3 IMPLANT
DRAPE OEC MINIVIEW 54X84 (DRAPES) IMPLANT
DRAPE U-SHAPE 47X51 STRL (DRAPES) ×3 IMPLANT
DRILL LAG 3.5MM QUICK RELEASE (DRILL) ×3
ELECT REM PT RETURN 9FT ADLT (ELECTROSURGICAL) ×3
ELECTRODE REM PT RTRN 9FT ADLT (ELECTROSURGICAL) ×1 IMPLANT
GAUZE SPONGE 4X4 12PLY STRL (GAUZE/BANDAGES/DRESSINGS) ×3 IMPLANT
GAUZE XEROFORM 1X8 LF (GAUZE/BANDAGES/DRESSINGS) ×3 IMPLANT
GLOVE BIO SURGEON STRL SZ7.5 (GLOVE) ×3 IMPLANT
GLOVE BIOGEL PI IND STRL 7.0 (GLOVE) ×2 IMPLANT
GLOVE BIOGEL PI IND STRL 8 (GLOVE) ×1 IMPLANT
GLOVE BIOGEL PI INDICATOR 7.0 (GLOVE) ×4
GLOVE BIOGEL PI INDICATOR 8 (GLOVE) ×2
GLOVE ECLIPSE 6.5 STRL STRAW (GLOVE) ×3 IMPLANT
GOWN STRL REUS W/ TWL LRG LVL3 (GOWN DISPOSABLE) ×1 IMPLANT
GOWN STRL REUS W/ TWL XL LVL3 (GOWN DISPOSABLE) ×1 IMPLANT
GOWN STRL REUS W/TWL LRG LVL3 (GOWN DISPOSABLE) ×3
GOWN STRL REUS W/TWL XL LVL3 (GOWN DISPOSABLE) ×3
GUIDEWIRE ORTHO 1.3X150 NT (WIRE) ×6 IMPLANT
NS IRRIG 1000ML POUR BTL (IV SOLUTION) ×3 IMPLANT
PACK BASIN DAY SURGERY FS (CUSTOM PROCEDURE TRAY) ×3 IMPLANT
PAD CAST 4YDX4 CTTN HI CHSV (CAST SUPPLIES) ×1 IMPLANT
PADDING CAST COTTON 4X4 STRL (CAST SUPPLIES) ×3
PADDING CAST SYN 6 (CAST SUPPLIES) ×4
PADDING CAST SYNTHETIC 4 (CAST SUPPLIES) ×6
PADDING CAST SYNTHETIC 4X4 STR (CAST SUPPLIES) ×3 IMPLANT
PADDING CAST SYNTHETIC 6X4 NS (CAST SUPPLIES) ×2 IMPLANT
PENCIL BUTTON HOLSTER BLD 10FT (ELECTRODE) ×3 IMPLANT
PLATE ANKLE 6H LAT LFT (Plate) ×3 IMPLANT
SCREW CANN LONG THRD 4.0X40 (Screw) ×6 IMPLANT
SCREW CORTICAL 3.5X20MM (Screw) ×3 IMPLANT
SCREW HEX LOCK 2.7X14MM (Screw) ×6 IMPLANT
SCREW NLCKG 2.7X10 HEXA (Screw) ×1 IMPLANT
SCREW NON LOCK 3.5X10MM (Screw) ×3 IMPLANT
SCREW NON LOCKING 2.7X10 (Screw) ×3 IMPLANT
SCREW NONLOCK 2.7X8 (Screw) ×3 IMPLANT
SCREW NONLOCK 3.5X44 (Screw) ×3 IMPLANT
SCREW NONLOCK 3.5X48MM (Screw) ×3 IMPLANT
SCREW NONLOCK HEX 3.5X12 (Screw) ×9 IMPLANT
SLEEVE SCD COMPRESS KNEE MED (MISCELLANEOUS) ×3 IMPLANT
SPLINT FAST PLASTER 5X30 (CAST SUPPLIES) ×40
SPLINT FIBERGLASS 4X30 (CAST SUPPLIES) IMPLANT
SPLINT PLASTER CAST FAST 5X30 (CAST SUPPLIES) ×20 IMPLANT
SPONGE LAP 18X18 RF (DISPOSABLE) ×3 IMPLANT
STAPLER VISISTAT 35W (STAPLE) ×3 IMPLANT
STOCKINETTE 6  STRL (DRAPES) ×2
STOCKINETTE 6 STRL (DRAPES) ×1 IMPLANT
STRIP CLOSURE SKIN 1/2X4 (GAUZE/BANDAGES/DRESSINGS) IMPLANT
SUCTION FRAZIER HANDLE 10FR (MISCELLANEOUS) ×2
SUCTION TUBE FRAZIER 10FR DISP (MISCELLANEOUS) ×1 IMPLANT
SUT ETHILON 3 0 PS 1 (SUTURE) ×3 IMPLANT
SUT MNCRL AB 3-0 PS2 18 (SUTURE) ×6 IMPLANT
SUT PDS AB 2-0 CT2 27 (SUTURE) ×3 IMPLANT
SUT VIC AB 3-0 FS2 27 (SUTURE) IMPLANT
SYR BULB 3OZ (MISCELLANEOUS) ×3 IMPLANT
TOWEL GREEN STERILE FF (TOWEL DISPOSABLE) ×6 IMPLANT
TUBE CONNECTING 20'X1/4 (TUBING) ×1
TUBE CONNECTING 20X1/4 (TUBING) ×2 IMPLANT
UNDERPAD 30X30 (UNDERPADS AND DIAPERS) ×3 IMPLANT
WIRE PLATE TACK (WIRE) ×6 IMPLANT

## 2018-07-18 NOTE — Progress Notes (Signed)
Assisted Dr. Tobias Alexander with left, ultrasound guided, popliteal/saphenous block. Side rails up, monitors on throughout procedure. See vital signs in flow sheet. Tolerated Procedure well.

## 2018-07-18 NOTE — Transfer of Care (Signed)
Immediate Anesthesia Transfer of Care Note  Patient: Karen Hopkins  Procedure(s) Performed: OPEN REDUCTION INTERNAL FIXATION (ORIF) LEFT ANKLE FRACTURE WITH  SYNDESMOSIS AND ANKLE ARTHROTOMY (Left Ankle)  Patient Location: PACU  Anesthesia Type:GA combined with regional for post-op pain  Level of Consciousness: sedated  Airway & Oxygen Therapy: Patient Spontanous Breathing and Patient connected to face mask oxygen  Post-op Assessment: Report given to RN and Post -op Vital signs reviewed and stable  Post vital signs: Reviewed and stable  Last Vitals:  Vitals Value Taken Time  BP    Temp    Pulse 86 07/18/2018 12:39 PM  Resp 29 07/18/2018 12:39 PM  SpO2 100 % 07/18/2018 12:39 PM  Vitals shown include unvalidated device data.  Last Pain:  Vitals:   07/18/18 0914  TempSrc: Oral  PainSc: 6          Complications: No apparent anesthesia complications

## 2018-07-18 NOTE — Op Note (Signed)
Karen Hopkins female 43 y.o. 07/18/2018  PreOperative Diagnosis: Left trimalleolar ankle fracture dislocation  PostOperative Diagnosis: Left trimalleolar ankle fracture dislocation Syndesmotic disruption Ankle joint loose bodies Lateral talar dome osteochondral lesion  PROCEDURE: Open treatment of left trimalleolar ankle fracture without posterior fixation Open treatment of syndesmosis Ankle arthrotomy and removal of loose bodies Open treatment of lateral talar dome osteochondral lesion  SURGEON: Radene Journey, MD  ASSISTANT: None  ANESTHESIA: General LMA anesthesia with peripheral nerve block  FINDINGS: See postoperative diagnosis  IMPLANTS: Acumed lateral fibular locking plate 4.0 mm partially-threaded cannulated screws  INDICATIONS:42 y.o. female sustained the above injury while walking in a garden.  She was seen in the emergency department and closed reduction was performed.  She was sent to my office for evaluation.  On discussion given the nature of her injury she was indicated for open treatment of her fracture.  We discussed the risk, benefits and alternatives to the surgery which included but were not limited to wound healing complications, infection, nonunion, malunion, development of arthritis, continued pain, stiffness, need for further surgery and damage to surrounding structures.  After weighing these risks he opted to proceed with surgery.  We also discussed the perioperative and anesthetic risk which included death.  PROCEDURE: The patient was identified in the preoperative holding area.  The left leg was marked by myself.  The consent was signed by myself and the patient.  A peripheral nerve block was performed by anesthesia.  She was taken to the operative suite and placed supine on operative table.  A thigh tourniquet was placed after general LMA anesthesia was induced without difficulty.  Preoperative antibiotics were given.  All bony prominences well-padded.   A bump was placed under the left hip.  Bone foam was used.  A surgical timeout was performed.  The left lower extremity was prepped and draped in the usual sterile fashion.  The leg was elevated and the tourniquet was inflated.  This was inflated to 250 mmHg.  We began by making a longitudinal incision overlying the fibula fracture.  This was taken sharply down through skin subcutaneous tissue.  Blunt dissection was used to identify and mobilize the superficial peroneal nerve which was not seen in the surgical field.  Then a extraperiosteal dissection proximal and distal to the fracture site was performed.  The fracture site was identified and mobilized.  The fracture site itself was cleaned out with a rondure, sharply with a 15 blade and with a curette.  There was interposed syndesmotic ligament tissue within the fracture site and there was a large fragment of cartilage and bone within the ankle joint that was seen through the fracture site.  This was on the lateral gutter.  This was removed.  There was no obvious talar dome lesion on the lateral talar dome.  Then the fracture site was reduced under direct visualization using a lobster claw.  A 3.5 mm lag screw was placed across the fracture site and the lag by technique fashion.  Then a distal fibular locking plate was placed without difficulty using a combination of locking and nonlocking screws.  This maintained acceptable reduction.  Fluoroscopy was used to confirm adequate plate placement, screw length and maintenance of reduction.  We then turned our attention to the medial side.  A 4 cm incision overlying the medial malleolus was performed.  There was a fracture blister near this site of the incision that had burst.  The incision was taken sharply down through skin subtenons  tissue.  The saphenous vein was just below the incision and this was mobilized.  A branch of the saphenous vein was ligated with Bovie cautery and the vein was mobilized and retracted.   Then the fracture site was identified and there was periosteal tissue interposed in the fracture site which was cleared out sharply as well as with a rondure.  Then the anteromedial joint capsule was identified.  A ankle arthrotomy was performed there.  The ankle joint was inspected.  There was a large flap of cartilage and subchondral bone on the lateral talar dome at the shoulder.  This was removed with a pickups.  Then there was evidence of loose cartilaginous portion of the lateral talar dome-using a 15 blade this was removed.  Then I took a curette and cleaned up the area of the osteochondral lesion back to a stable cartilaginous base.  Then the joint was copiously irrigated and remaining small fragments of cartilage were removed from the joint.  Then I turned my attention back to the medial malleolus fracture.  Using a pointed reduction forcep the fracture site was reduced under direct visualization.  2 guidewires for the 4 oh cannulated screws were placed across the fracture site and checked on fluoroscopy.  Then the 40 mm screws were placed without difficulty.  This maintained adequate reduction and compression across the fracture site.  I then turned my attention back to the lateral side.  The syndesmosis was identified and the ligaments anteriorly were disrupted.  I was able to put a Valora Corporal between the fibula and the tibia and there was significant amount of laxity there.  Because of this a large Weber clamp was placed across the syndesmosis and gently tightened.  Direct reduction of the syndesmosis was performed under direct visualization.  Then 2 syndesmotic screws were placed quadracortically across the fibula and tibia.  They were parallel to the ankle joint.  The syndesmosis was then revisualized and found to be stable.  Final x-rays confirmed adequate screw length, reduction and mortise.  Then the wounds were irrigated with normal saline.  2-0 PDS was used to close tissue overlying the lateral  fibular plate.  3-0 Monocryl was used for the subcuticular stitch and staples were used for the skin.  The area where the fracture blister was on the medial side was closed with 3-0 nylon stitch.  Soft dressing including Xeroform, 4 x 4's and sterile she cotton was placed.  She was placed in a posterior sugar tong splint.  The tourniquet was released prior to skin closure.  And hemostasis was obtained with Bovie cautery and direct compression.  Tourniquet time was 90 minutes.  The patient was awakened from anesthesia and transferred to the recovery room in stable condition.  There were no complications.  Counts were correct at the end of the case.  POST OPERATIVE INSTRUCTIONS: Nonweightbearing to left lower extremity Keep splint dry and elevate limb Take 1 325 mg aspirin daily for DVT prophylaxis Follow-up in 2 weeks for splint removal, suture removal, x-rays and placement of a nonweightbearing cast Call the office with concerns  TOURNIQUET TIME: 90 minutes  BLOOD LOSS:  Minimal         DRAINS: none         SPECIMEN: none       COMPLICATIONS:  * No complications entered in OR log *         Disposition: PACU - hemodynamically stable.         Condition: stable

## 2018-07-18 NOTE — Anesthesia Preprocedure Evaluation (Addendum)
Anesthesia Evaluation  Patient identified by MRN, date of birth, ID band Patient awake    Reviewed: Allergy & Precautions, NPO status , Patient's Chart, lab work & pertinent test results  History of Anesthesia Complications Negative for: history of anesthetic complications  Airway Mallampati: II  TM Distance: >3 FB Neck ROM: Full    Dental no notable dental hx. (+) Dental Advisory Given   Pulmonary neg pulmonary ROS, former smoker,    Pulmonary exam normal        Cardiovascular hypertension, Normal cardiovascular exam     Neuro/Psych PSYCHIATRIC DISORDERS Anxiety Depression negative neurological ROS     GI/Hepatic negative GI ROS, Neg liver ROS,   Endo/Other  diabetesMorbid obesity  Renal/GU negative Renal ROS     Musculoskeletal negative musculoskeletal ROS (+)   Abdominal   Peds  Hematology negative hematology ROS (+)   Anesthesia Other Findings Day of surgery medications reviewed with the patient.  Reproductive/Obstetrics                            Anesthesia Physical Anesthesia Plan  ASA: II  Anesthesia Plan: General   Post-op Pain Management:  Regional for Post-op pain   Induction: Intravenous  PONV Risk Score and Plan: 3 and Ondansetron, Dexamethasone and Scopolamine patch - Pre-op  Airway Management Planned: LMA  Additional Equipment:   Intra-op Plan:   Post-operative Plan: Extubation in OR  Informed Consent: I have reviewed the patients History and Physical, chart, labs and discussed the procedure including the risks, benefits and alternatives for the proposed anesthesia with the patient or authorized representative who has indicated his/her understanding and acceptance.     Dental advisory given  Plan Discussed with: CRNA and Anesthesiologist  Anesthesia Plan Comments:        Anesthesia Quick Evaluation

## 2018-07-18 NOTE — Discharge Instructions (Signed)
NO TYLENOL BEFORE 3 PM TODAY!      DR. Lucia Gaskins [facility] ANK[facility] SURGERY POST-OP INSTRUCTIONS   Pain Management 1. The numbing medicine and your leg will last around 18 hours, take a dose of your pain medicine as soon as you feel it wearing off to avoid rebound pain. 2. Keep your foot elevated above heart level.  Make sure that your heel hangs free ('floats'). 3. Take all prescribed medication as directed. 4. If taking narcotic pain medication you may want to use an over-the-counter stool softener to avoid constipation. 5. You may take over-the-counter NSAIDs (ibuprofen, naproxen, etc.) as well as over-the-counter acetaminophen as directed on the packaging as a supplement for your pain and may also use it to wean away from the prescription medication.  Activity ?  ? Non-weightbearing ? Postoperatively, you will be placed into a splint which stays on for 2 weeks and then will be changed at your first postop visit.  First Postoperative Visit 1. Your first postop visit will be at least 2 weeks after surgery.  This should be scheduled when you schedule surgery. 2. If you do not have a postoperative visit scheduled please call [phone] to schedule an appointment. 3. At the appointment your incision will be evaluated for suture removal, x-rays will be obtained if necessary.  General Instructions 1. Swelling is very common after foot and ankle surgery.  It often takes 3 months for the foot and ankle to begin to feel comfortable.  Some amount of swelling will persist for 6-12 months. 2. DO NOT change the dressing.  If there is a problem with the dressing (too tight, loose, gets wet, etc.) please contact Dr. Pollie Friar office. 3. DO NOT get the dressing wet.  For showers you can use an over-the-counter cast cover or wrap a washcloth around the top of your dressing and then cover it with a plastic bag and tape it to your leg. 4. DO NOT soak the incision (no tubs, pools, bath, etc.) until you have  approval from Dr. Lucia Gaskins.  Contact Dr. Huel Cote office or go to Emergency Room if: 1. Temperature above 101 F. 2. Increasing pain that is unresponsive to pain medication or elevation 3. Excessive redness or swelling in your foot 4. Dressing problems - excessive bloody drainage, looseness or tightness, or if dressing gets wet 5. Develop pain, swelling, warmth, or discoloration of your calf     Regional Anesthesia Blocks  1. Numbness or the inability to move the "blocked" extremity may last from 3-48 hours after placement. The length of time depends on the medication injected and your individual response to the medication. If the numbness is not going away after 48 hours, call your surgeon.  2. The extremity that is blocked will need to be protected until the numbness is gone and the  Strength has returned. Because you cannot feel it, you will need to take extra care to avoid injury. Because it may be weak, you may have difficulty moving it or using it. You may not know what position it is in without looking at it while the block is in effect.  3. For blocks in the legs and feet, returning to weight bearing and walking needs to be done carefully. You will need to wait until the numbness is entirely gone and the strength has returned. You should be able to move your leg and foot normally before you try and bear weight or walk. You will need someone to be with you when you  first try to ensure you do not fall and possibly risk injury.  4. Bruising and tenderness at the needle site are common side effects and will resolve in a few days.  5. Persistent numbness or new problems with movement should be communicated to the surgeon or the [place] [phone] [place] [phone]).        Post Anesthesia Home Care Instructions  Activity: Get plenty of rest for the remainder of the day. A responsible individual must stay with you for 24 hours following the  procedure.  For the next 24 hours, DO NOT: -Drive a car -Paediatric nurse -Drink alcoholic beverages -Take any medication unless instructed by your physician -Make any legal decisions or sign important papers.  Meals: Start with liquid foods such as gelatin or soup. Progress to regular foods as tolerated. Avoid greasy, spicy, heavy foods. If nausea and/or vomiting occur, drink only clear liquids until the nausea and/or vomiting subsides. Call your physician if vomiting continues.  Special Instructions/Symptoms: Your throat may feel dry or sore from the anesthesia or the breathing tube placed in your throat during surgery. If this causes discomfort, gargle with warm salt water. The discomfort should disappear within 24 hours.  If you had a scopolamine patch placed behind your ear for the management of post- operative nausea and/or vomiting:  1. The medication in the patch is effective for 72 hours, after which it should be removed.  Wrap patch in a tissue and discard in the trash. Wash hands thoroughly with soap and water. 2. You may remove the patch earlier than 72 hours if you experience unpleasant side effects which may include dry mouth, dizziness or visual disturbances. 3. Avoid touching the patch. Wash your hands with soap and water after contact with the patch.           Information for Discharge Teaching: EXPAREL (bupivacaine liposome injectable suspension)   Your surgeon or anesthesiologist gave you EXPAREL(bupivacaine) to help control your pain after surgery.   EXPAREL is a local anesthetic that provides pain relief by numbing the tissue around the surgical site.  EXPAREL is designed to release pain medication over time and can control pain for up to 72 hours.  Depending on how you respond to EXPAREL, you may require less pain medication during your recovery.  Possible side effects:  Temporary loss of sensation or ability to move in the area where bupivacaine was  injected.  Nausea, vomiting, constipation  Rarely, numbness and tingling in your mouth or lips, lightheadedness, or anxiety may occur.  Call your doctor right away if you think you may be experiencing any of these sensations, or if you have other questions regarding possible side effects.  Follow all other discharge instructions given to you by your surgeon or nurse. Eat a healthy diet and drink plenty of water or other fluids.  If you return to the hospital for any reason within 96 hours following the administration of EXPAREL, it is important for health care providers to know that you have received this anesthetic. A teal colored band has been placed on your arm with the date, time and amount of EXPAREL you have received in order to alert and inform your health care providers. Please leave this armband in place for the full 96 hours following administration, and then you may remove the band.

## 2018-07-18 NOTE — Anesthesia Procedure Notes (Signed)
Procedure Name: LMA Insertion Date/Time: 07/18/2018 10:22 AM Performed by: Signe Colt, CRNA Pre-anesthesia Checklist: Patient identified, Emergency Drugs available, Suction available and Patient being monitored Patient Re-evaluated:Patient Re-evaluated prior to induction Oxygen Delivery Method: Circle system utilized Preoxygenation: Pre-oxygenation with 100% oxygen Induction Type: IV induction Ventilation: Mask ventilation without difficulty LMA: LMA inserted LMA Size: 4.0 Number of attempts: 1 Airway Equipment and Method: Bite block Placement Confirmation: positive ETCO2 Tube secured with: Tape Dental Injury: Teeth and Oropharynx as per pre-operative assessment

## 2018-07-18 NOTE — Anesthesia Procedure Notes (Addendum)
Anesthesia Regional Block: Popliteal block   Pre-Anesthetic Checklist: ,, timeout performed, Correct Patient, Correct Site, Correct Laterality, Correct Procedure, Correct Position, site marked, Risks and benefits discussed,  Surgical consent,  Pre-op evaluation,  At surgeon's request and post-op pain management  Laterality: Left  Prep: chloraprep       Needles:  Injection technique: Single-shot  Needle Type: Echogenic Stimulator Needle          Additional Needles:   Narrative:  Start time: 07/18/2018 9:54 AM End time: 07/18/2018 10:04 AM Injection made incrementally with aspirations every 5 mL.  Performed by: Personally  Anesthesiologist: Duane Boston, MD  Additional Notes: A functioning IV was confirmed and monitors were applied.  Sterile prep and drape, hand hygiene and sterile gloves were used.  Negative aspiration and test dose prior to incremental administration of local anesthetic. The patient tolerated the procedure well.Ultrasound  guidance: relevant anatomy identified, needle position confirmed, local anesthetic spread visualized around nerve(s), vascular puncture avoided.  Image printed for medical record. Adductor supplementation.

## 2018-07-18 NOTE — H&P (Signed)
Karen Hopkins is an 43 y.o. female.   Chief Complaint: Left trimalleolar ankle fracture HPI: Karen Hopkins is a 43 year old female who sustained a left trimalleolar ankle fracture when slipping during ambulating.  She was seen in the outside emergency department and a closed reduction was performed.  She was sent to my office for evaluation.  On evaluation she was diagnosed with a left trimalleolar ankle fracture and was indicated for operative fixation.  The posterior malleoli piece is small and we discussed the likelihood of not needing fixation.  She is here today for surgery.  She is been maintaining nonweightbearing.  Her pain is been controlled.  She denies any numbness or tingling.  Past Medical History:  Diagnosis Date  . Anxiety   . Cancer ([place]) 2011   Rt. Br. Ca  . Depression   . Essential hypertension 11/05/2015  . History of breast cancer 2011   right  . History of chemotherapy 2011  . History of radiation therapy 2011  . Hypertension    under control with med., has been on med. x 2 yr.  . Lymphedema of arm    right; no BP or puncture to right arm  . Personal history of chemotherapy 09/16/2009   rt breast  . Personal history of radiation therapy 05/2010   rt breast  . Pneumonia   . Seasonal allergies   . Sinus headache     Past Surgical History:  Procedure Laterality Date  . BREAST BIOPSY Right 08/26/2009  . BREAST REDUCTION SURGERY Left 08/23/2013   Procedure: LEFT BREAST REDUCTION  ;  Surgeon: Theodoro Kos, DO;  Location: [facility];  Service: Plastics;  Laterality: Left;  . CESAREAN SECTION  07/15/2001; 03/18/2004; 11/21/2008  . lateral orbiotomy Left 11/2015   Dr. Toy Cookey at [facility]  . LATISSIMUS FLAP TO BREAST Right 03/12/2013   Procedure: RIGHT BREAST LATISSIMUS FLAP WITH EXPANDER PLACEMENT;  Surgeon: Theodoro Kos, DO;  Location: [facility];  Service: Plastics;  Laterality: Right;  . LIPOSUCTION Bilateral 08/23/2013   Procedure: LIPOSUCTION;  Surgeon: Theodoro Kos,  DO;  Location: [facility];  Service: Plastics;  Laterality: Bilateral;  . MASTECTOMY Right 2011  . MODIFIED RADICAL MASTECTOMY Right 03/11/2010  . NASAL SEPTUM SURGERY    . PORT-A-CATH REMOVAL Left 12/01/2010  . PORTACATH PLACEMENT Left 09/12/2009  . REDUCTION MAMMAPLASTY Left 08/2013  . REMOVAL OF TISSUE EXPANDER AND PLACEMENT OF IMPLANT Right 08/23/2013   Procedure: REMOVAL RIGHT TISSUE EXPANDER AND PLACEMENT OF IMPLANT TO RIGHT BREAST ;  Surgeon: Theodoro Kos, DO;  Location: [facility];  Service: Plastics;  Laterality: Right;  . SUPRA-UMBILICAL HERNIA  [id number]  . TUBAL LIGATION  11/21/2008    Family History  Problem Relation Age of Onset  . Hypertension Mother   . Breast cancer Mother 46  . Diabetes type II Father   . Prostate cancer Father   . Stroke Neg Hx    Social History:  reports that she quit smoking about 8 years ago. Her smoking use included cigarettes. She started smoking about 16 years ago. She has a 1.25 pack-year smoking history. She has never used smokeless tobacco. She reports that she does not drink alcohol or use drugs.  Allergies:  Allergies  Allergen Reactions  . Penicillins Swelling    FACIAL SWELLING Did it involve swelling of the face/tongue/throat, SOB, or low BP? Y Did it involve sudden or severe rash/hives, skin peeling, or any reaction on the inside of your mouth or nose?  [place] Did you need to seek medical attention at a hospital or doctor's office? N When did it last happen?more than 10 years If all above answers are "NO", may proceed with cephalosporin use.   Recardo Evangelist [Pregabalin] Nausea Only    .  Marland Kitchen Meloxicam Nausea Only  . Robaxin [Methocarbamol] Nausea Only    Medications Prior to Admission  Medication Sig Dispense Refill  . diclofenac sodium (VOLTAREN) 1 % GEL Apply 4 g topically 4 (four) times daily. Use as directed 3 Tube 4  . polyethylene glycol powder (GLYCOLAX) powder Take 17 g by mouth daily. 500 g 5  .  tiZANidine (ZANAFLEX) 4 MG tablet TAKE ONE (1) TABLET BY MOUTH 3 TIMES DAILY 90 tablet 3  . topiramate (TOPAMAX) 50 MG tablet Take 3 tablets (150 mg total) by mouth at bedtime. 90 tablet 3  . albuterol (PROAIR HFA) 108 (90 Base) MCG/ACT inhaler Inhale 2 puffs into the lungs every 6 (six) hours as needed for wheezing or shortness of breath. (Patient not taking: Reported on 07/12/2018) 18 g 3    No results found. However, due to the size of the patient record, not all encounters were searched. Please check Results Review for a complete set of results. No results found.  Review of Systems  Constitutional: Negative.   HENT: Negative.   Eyes: Negative.   Respiratory: Negative.   Cardiovascular: Negative.   Gastrointestinal: Negative.   Musculoskeletal:       Left ankle pain  Skin: Negative.   Neurological: Negative.     Blood pressure (!) 139/93, pulse 89, temperature 98 F (36.7 C), temperature source Oral, resp. rate 16, height 5\' 4"  (1.626 m), weight 94.8 kg, last menstrual period 07/22/2014, SpO2 100 %. Physical Exam  Constitutional: She appears well-developed.  HENT:  Head: Normocephalic.  Eyes: Conjunctivae are normal.  Neck: Neck supple.  Cardiovascular: Normal rate.  Respiratory: Effort normal.  Musculoskeletal:     Comments: Left lower extremity in a short leg splint.  Toes exposed are warm well perfused.  Endorses sensation over the toes.  Able to wiggle toes.  No tenderness proximal to the splint.  No tenderness to palpation at the knee.  Neurological: She is alert.  Skin: Skin is warm.  Psychiatric: She has a normal mood and affect.     Assessment/Plan We will proceed with open treatment of her left trimalleolar ankle fracture.  We will fix the lateral medial side and assess for syndesmotic instability.  We may need posterior lip fixation if the piece does not reduce adequately.  We discussed the risk, benefits alternatives of surgery which included but were not limited to  wound healing complications, infection, nonunion, malunion, need for further surgery, demonstrating structures, we also discussed the perioperative and anesthetic risk which include death.  She understands the postoperative weightbearing restrictions.  She would like to proceed with surgery.  Erle Crocker, MD 07/18/2018, 9:57 AM

## 2018-07-18 NOTE — Anesthesia Postprocedure Evaluation (Signed)
Anesthesia Post Note  Patient: Karen Hopkins  Procedure(s) Performed: OPEN REDUCTION INTERNAL FIXATION (ORIF) LEFT ANKLE FRACTURE WITH  SYNDESMOSIS AND ANKLE ARTHROTOMY (Left Ankle)     Patient location during evaluation: PACU Anesthesia Type: General Level of consciousness: sedated Pain management: pain level controlled Vital Signs Assessment: post-procedure vital signs reviewed and stable Respiratory status: spontaneous breathing and respiratory function stable Cardiovascular status: stable Postop Assessment: no apparent nausea or vomiting Anesthetic complications: no    Last Vitals:  Vitals:   07/18/18 1345 07/18/18 1418  BP: 126/83 (!) 146/96  Pulse: 90 92  Resp: (!) 21 16  Temp:  36.7 C  SpO2: 100% 100%    Last Pain:  Vitals:   07/18/18 1418  TempSrc: Oral  PainSc: 0-No pain                 Jenevieve Kirschbaum DANIEL

## 2018-07-19 ENCOUNTER — Encounter (HOSPITAL_BASED_OUTPATIENT_CLINIC_OR_DEPARTMENT_OTHER): Payer: Self-pay | Admitting: Orthopaedic Surgery

## 2018-07-19 NOTE — Telephone Encounter (Signed)
Karen Hopkins called back with questions about Karen Hopkins's medication. Pharmacy is refusing to fill her post op medication. I explained last rx was 07/06/18 with taper instructions in letter (called [facility] as well). The issue is with the pharmacy and Karen Hopkins will call and speak with them.

## 2018-07-20 ENCOUNTER — Encounter (HOSPITAL_BASED_OUTPATIENT_CLINIC_OR_DEPARTMENT_OTHER): Payer: Self-pay | Admitting: Orthopaedic Surgery

## 2018-07-20 ENCOUNTER — Other Ambulatory Visit: Payer: Self-pay | Admitting: Licensed Clinical Social Worker

## 2018-07-20 NOTE — Patient Outreach (Addendum)
[facility] [facility]) Care Management  07/20/2018  Francys Bolin Shimizu 08-24-1975 [id number]  [facility] CSW received UM referral on 07/18/2018 and successfully completed initial outreach to patient on 07/20/2018. Patient answered and provided HIPPA verifications. Patient reports to be doing well since her ankle surgery but shares that she will have transportation barriers as she cannot drive and does not know when she will be able to. [facility] CSW provided education on [name] transportation and Medicaid transportation benefits. [facility] CSW successfully sent secure email to patient with information on these resources as well. Patient reports interest in gaining SCAT services as well in order to gain transportation to non medical appointmentsl. [facility] CSW will refer [facility] BSW at this time to assist patient with SCAT application process. Patient will use [employer] transportation benefits for any upcoming medical appointments. Patient also shares that she may need assistance with gaining DME that physician recommended. [facility] CSW suggested that patient discuss this need with her DSS caseworker as well. [facility] CSW will update [facility] BSW. [facility] CSW will sign off at this time.   Eula Fried, BSW, MSW, [email or web address] Phone: [phone] Fax: [phone]

## 2018-07-21 ENCOUNTER — Other Ambulatory Visit: Payer: Self-pay

## 2018-07-21 NOTE — Patient Outreach (Signed)
[facility]) Care Management  07/21/2018  Karen Hopkins 28-Feb-1976 [id number]   Initial outreach to [name] regarding social work referral for assistance with SCAT application.  BSW completed application with [name] via phone and informed her of eligibility process.  Application will be faxed to SCAT eligibility.  BSW will follow up with [name] within the next two weeks to ensure she has been contacted for eligibility interview.   Ronn Melena, BSW Social Worker [phone]

## 2018-07-24 ENCOUNTER — Other Ambulatory Visit: Payer: Self-pay | Admitting: *Deleted

## 2018-07-24 NOTE — Patient Outreach (Signed)
[place] [facility]) Care Management  07/24/2018  Dutchess Crosland Waln September 04, 1975 [id number]   Referral received from [facility] UM team as member went into hospital for elective surgery for ankle fracture.  Per chart, she also has history for hypertension (controlled) and diabetes (A1C - 5.7).  Call placed to member for telephone assessment.  Identity verified, this care manager introduced self and stated purpose of call.  [facility] care management services explained.  She report she live with her children, confirms she has been contacted by [facility] regarding transportation concerns during recovery.  She also report she is in need of a knee walker and is interested in possibly having an aide to come in a few hours a day when she is recovering and unable to put weight on her foot.  Denies discussing this with BSW, this care manager will provide update and make request for additional community resources.  This care manager inquired about nursing needs, she denies.  State her hypertension and diabetes are both under control, denies the need for assistance with medications.  Will follow up with BSW, will close to nursing.  Valente David, [place], MSN [place] [phone]

## 2018-07-25 ENCOUNTER — Inpatient Hospital Stay: Payer: Medicare Other

## 2018-07-25 ENCOUNTER — Inpatient Hospital Stay: Payer: Medicare Other | Admitting: Oncology

## 2018-07-25 ENCOUNTER — Telehealth: Payer: Self-pay | Admitting: *Deleted

## 2018-07-25 MED ORDER — ALPRAZOLAM ER 1 MG PO TB24: 1.0000 mg | ORAL_TABLET | Freq: Every day | ORAL | 0 refills | Status: DC

## 2018-07-25 NOTE — Telephone Encounter (Signed)
This RN returned VM left by pt stating she was unable to come in for visit today due to recent injury and surgery with ankle " I can't walk right now "  Per discussion - Karen Hopkins states she also has been " released from the [facility] because I missed appointments " " I have been dealing with my ankle and all - sleeping a lot that I didn't get to my appointments "  She states she is out of medications.  This RN discussed above including need for appropriate follow up - noted call placed from [facility] and requested the patient to contact the [facility] nurse who called her to discuss need for medical follow up.  Karen Hopkins states she is not on any BP medication presently nor doing any follow up for her elevated A1-C.  ( has medications that show as d/ced on med list - unsure if med is actually discontinued or if per inpt stay pt not taking and therefore removed from her list )  This office can refill pt's xanax er she has been using for ongoing anxiety issues. Appointment for MD visit with injection needs to be scheduled when pt is able to get around.  Again this RN requested to contact the [facility] nurse to discuss need to be seen by a primary MD ( she states she has been going to the [facility] ) - for follow up on medications.

## 2018-07-26 ENCOUNTER — Other Ambulatory Visit: Payer: Self-pay | Admitting: *Deleted

## 2018-07-26 NOTE — Patient Outreach (Signed)
[place] [facility]) Care Management  07/26/2018  Karen Hopkins [birth year] [id number]   Received message from Billy Coast, RN with the cancer center, stating member called concerned about being out of medications and not having a physician to see due to being discharged from the [facility].  She was also concerned about being able to get to her MD appointments.    Call placed to member to follow up on concern.  Noted that her primary MD listed is Dr. Ardelia Mems with [facility].  She is aware that this is her assigned clinic and is aware of the contact number to request appointment.  This care manager also inquired about medication concern.  She state she is not taking many at this time as she "took myself off a lot of them."  Medication list reviewed, she she has all medications listed in chart.  She also state she has enough medication in the bottles and has remaining refills on the prescriptions.  She report she has appointment next week with orthopedic specialist (2/26 @ 0945, location [place]).  She is aware that BSW has been assisting with application for SCAT, but state she will need a ride next week.  This care manager will follow up with BSW regarding this request.  Lastly, she again state she is in need of in home aide services.  State she was approved in the past for [facility], need approval again by MD that was not completed in time.  This care manager will notify BSW of this as well.    She denies any urgent concerns at this time.  Will follow up within the next 2 weeks.  If continue to deny nursing concerns, will keep case closed.  Valente David, [place], MSN [place] [phone]

## 2018-07-27 ENCOUNTER — Other Ambulatory Visit: Payer: Self-pay

## 2018-07-27 NOTE — Patient Outreach (Signed)
[name]) Care Management  07/27/2018  [name] [birth year] [id number]   BSW received message from [facility], Valente David, regarding transportation for [name] to orthopedic appointment next week due to SCAT application still being processed. BSW contacted [name] and confirmed that SCAT is not an option right now because she has not completed her eligibility interview.  She acknowledged that Medicaid transportation is an option for her.  BSW informed her that she may have transportation benefits through [employer] and provided her with the contact number for [employer].  [name] requested assistance with DME; specifically a scooter.  BSW informed her this would have to be ordered by her MD. She has already discussed this with her insurance company and this equipment is not covered but can be rented for $20 per week.  BSW is unaware of resources to assist with this cost.  BSW and [name] discussed personal care services.  She intends to contact her primary care provider to ask for request form to be completed.  BSW will follow up her within the next two weeks to ensure she has been contacted by SCAT.  Ronn Melena, BSW Social Worker [phone]

## 2018-08-01 NOTE — Progress Notes (Signed)
PA for Alprazolam ER 1 mg tablets has been submitted.

## 2018-08-02 DIAGNOSIS — S82842D Displaced bimalleolar fracture of left lower leg, subsequent encounter for closed fracture with routine healing: Secondary | ICD-10-CM | POA: Diagnosis not present

## 2018-08-03 ENCOUNTER — Encounter: Payer: Self-pay | Admitting: *Deleted

## 2018-08-03 ENCOUNTER — Other Ambulatory Visit: Payer: Self-pay

## 2018-08-03 ENCOUNTER — Ambulatory Visit: Payer: Self-pay

## 2018-08-03 NOTE — Patient Outreach (Signed)
[facility]) Care Management  08/03/2018  Karen Hopkins 02/28/1976 [id number]   Unsuccessful outreach to patient to follow up on status of SCAT application.  BSW left voicemail message; will attempt to reach again within four business days.  Ronn Melena, BSW Social Worker [phone]

## 2018-08-04 ENCOUNTER — Other Ambulatory Visit: Payer: Self-pay | Admitting: *Deleted

## 2018-08-04 NOTE — Patient Outreach (Signed)
[facility] [facility]) Care Management  08/04/2018  Saige Canton Bribiesca [birth year] [id number]   Call placed to member to follow up on contact with social worker and scheduling of office visit with listed PCP.  No answer, HIPAA compliant voice message left.  Will follow up within the next 4 business days.  Valente David, [place], MSN [place] [phone]

## 2018-08-07 ENCOUNTER — Encounter: Payer: Medicare Other | Admitting: Registered Nurse

## 2018-08-08 ENCOUNTER — Other Ambulatory Visit: Payer: Self-pay

## 2018-08-08 NOTE — Patient Outreach (Signed)
[facility]) Care Management  08/08/2018  Karen Hopkins Jul 28, 1975 [id number]   Successful outreach to patient regarding status of SCAT application that was faxed on 07/25/18.  Patient has not been contacted by SCAT eligibility to schedule the face to face interview.  BSW left voicemail message with [name] at [place].  Will follow up with patient again after response.  Ronn Melena, BSW Social Worker [phone]

## 2018-08-10 ENCOUNTER — Other Ambulatory Visit: Payer: Self-pay | Admitting: *Deleted

## 2018-08-10 NOTE — Patient Outreach (Signed)
[place] [facility]) Care Management  08/10/2018  Karen Hopkins [birth year] [id number]   Call placed to member to follow up on contact with primary MD office and BSW for community resources.  State she has still not been in contact with SCAT regarding transportation.  However, report she now has a cast on her foot and has been granted permission from her MD to drive.  State she did drive a short distance but it was painful  State "I just have to do what I need to because SCAT taking too long."  This care manager inquired about scheduling appointment with primary MD.  She state "I don't know why I need to go."  Reminded of required yearly wellness visits as well as managing chronic conditions effectively.  Also reminded that she previously requested another psych referral as she was discharged from [facility].  Advised to contact as soon as possible to schedule appointment and to request information regarding psych treatment.  She verbalizes understanding and state she will call today.  Continues to deny any nursing needs, will keep case closed to nursing.    Valente David, [place], MSN [place] [phone]

## 2018-08-11 ENCOUNTER — Other Ambulatory Visit: Payer: Self-pay

## 2018-08-11 NOTE — Patient Outreach (Signed)
[facility]) Care Management  08/11/2018  Karen Hopkins 1975-10-25 [id number]   Outreach to patient to ensure that she still wants to pursue transportation services through [place] spoke with [name] at [facility] eligibility yesterday who requested that application be faxed again due to portion missing.  Patient reported that she has been driving although it has been a bit of a "struggle".  BSW reminded her that SCAT services are specifically for individuals that cannot drive or utilize the public bus system.  BSW offered to resend application so that eligibility interview can be scheduled but informed that she may not be approved for services.  Patient declined services at this time.  BSW closing case.   Ronn Melena, BSW Social Worker [phone]

## 2018-08-21 ENCOUNTER — Ambulatory Visit: Payer: Medicare Other | Admitting: Family Medicine

## 2018-08-29 ENCOUNTER — Other Ambulatory Visit: Payer: Self-pay | Admitting: *Deleted

## 2018-08-29 ENCOUNTER — Telehealth: Payer: Self-pay | Admitting: *Deleted

## 2018-08-29 MED ORDER — ALPRAZOLAM ER 1 MG PO TB24: 1.0000 mg | ORAL_TABLET | Freq: Every day | ORAL | 0 refills | Status: DC

## 2018-08-30 MED FILL — ALPRAZolam XR 1 MG TB24: 1 | 30 days supply | Qty: 30 | Fill #0

## 2018-08-31 ENCOUNTER — Inpatient Hospital Stay: Payer: Medicare Other | Admitting: Oncology

## 2018-08-31 ENCOUNTER — Inpatient Hospital Stay: Payer: Medicare Other

## 2018-09-06 DIAGNOSIS — S82842D Displaced bimalleolar fracture of left lower leg, subsequent encounter for closed fracture with routine healing: Secondary | ICD-10-CM | POA: Diagnosis not present

## 2018-09-27 ENCOUNTER — Other Ambulatory Visit: Payer: Self-pay | Admitting: Oncology

## 2018-09-27 MED FILL — ALPRAZolam XR 1 MG TB24: 1 | 30 days supply | Qty: 30 | Fill #0

## 2018-09-28 ENCOUNTER — Other Ambulatory Visit: Payer: Self-pay | Admitting: Oncology

## 2018-09-28 MED FILL — TOPIRAMATE 50 MG TABLET: 50 | 30 days supply | Qty: 90 | Fill #0

## 2018-09-29 ENCOUNTER — Other Ambulatory Visit: Payer: Self-pay | Admitting: Oncology

## 2018-09-30 ENCOUNTER — Other Ambulatory Visit: Payer: Self-pay | Admitting: Oncology

## 2018-10-02 ENCOUNTER — Other Ambulatory Visit: Payer: Self-pay | Admitting: *Deleted

## 2018-10-02 ENCOUNTER — Inpatient Hospital Stay: Payer: Medicare Other | Attending: Oncology | Admitting: Oncology

## 2018-10-02 ENCOUNTER — Other Ambulatory Visit: Payer: Self-pay

## 2018-10-02 ENCOUNTER — Encounter: Payer: Self-pay | Admitting: Oncology

## 2018-10-02 ENCOUNTER — Inpatient Hospital Stay: Payer: Medicare Other

## 2018-10-02 ENCOUNTER — Telehealth: Payer: Self-pay | Admitting: *Deleted

## 2018-10-02 VITALS — BP 125/73 | HR 80 | Temp 99.1°F | Resp 18 | Ht 64.0 in | Wt 204.2 lb

## 2018-10-02 DIAGNOSIS — F329 Major depressive disorder, single episode, unspecified: Secondary | ICD-10-CM | POA: Insufficient documentation

## 2018-10-02 DIAGNOSIS — Z170 Estrogen receptor positive status [ER+]: Principal | ICD-10-CM

## 2018-10-02 DIAGNOSIS — Z87891 Personal history of nicotine dependence: Secondary | ICD-10-CM | POA: Insufficient documentation

## 2018-10-02 DIAGNOSIS — R590 Localized enlarged lymph nodes: Secondary | ICD-10-CM | POA: Diagnosis not present

## 2018-10-02 DIAGNOSIS — Z803 Family history of malignant neoplasm of breast: Secondary | ICD-10-CM

## 2018-10-02 DIAGNOSIS — C50811 Malignant neoplasm of overlapping sites of right female breast: Secondary | ICD-10-CM

## 2018-10-02 DIAGNOSIS — Z79899 Other long term (current) drug therapy: Secondary | ICD-10-CM | POA: Insufficient documentation

## 2018-10-02 DIAGNOSIS — I10 Essential (primary) hypertension: Secondary | ICD-10-CM | POA: Insufficient documentation

## 2018-10-02 DIAGNOSIS — Z8042 Family history of malignant neoplasm of prostate: Secondary | ICD-10-CM | POA: Diagnosis not present

## 2018-10-02 DIAGNOSIS — I890 Lymphedema, not elsewhere classified: Secondary | ICD-10-CM

## 2018-10-02 DIAGNOSIS — Z9011 Acquired absence of right breast and nipple: Secondary | ICD-10-CM

## 2018-10-02 DIAGNOSIS — Z923 Personal history of irradiation: Secondary | ICD-10-CM | POA: Insufficient documentation

## 2018-10-02 DIAGNOSIS — Z9221 Personal history of antineoplastic chemotherapy: Secondary | ICD-10-CM | POA: Diagnosis not present

## 2018-10-02 LAB — CBC WITH DIFFERENTIAL/PLATELET
Abs Immature Granulocytes: 0.01 10*3/uL (ref 0.00–0.07)
Basophils Absolute: 0 10*3/uL (ref 0.0–0.1)
Basophils Relative: 0 %
Eosinophils Absolute: 0.2 10*3/uL (ref 0.0–0.5)
Eosinophils Relative: 3 %
HCT: 41.4 % (ref 36.0–46.0)
Hemoglobin: 13.6 g/dL (ref 12.0–15.0)
Immature Granulocytes: 0 %
Lymphocytes Relative: 41 %
Lymphs Abs: 2.2 10*3/uL (ref 0.7–4.0)
MCH: 29.3 pg (ref 26.0–34.0)
MCHC: 32.9 g/dL (ref 30.0–36.0)
MCV: 89.2 fL (ref 80.0–100.0)
Monocytes Absolute: 0.4 10*3/uL (ref 0.1–1.0)
Monocytes Relative: 8 %
Neutro Abs: 2.5 10*3/uL (ref 1.7–7.7)
Neutrophils Relative %: 48 %
Platelets: 236 10*3/uL (ref 150–400)
RBC: 4.64 MIL/uL (ref 3.87–5.11)
RDW: 13.6 % (ref 11.5–15.5)
WBC: 5.3 10*3/uL (ref 4.0–10.5)
nRBC: 0 % (ref 0.0–0.2)

## 2018-10-02 LAB — COMPREHENSIVE METABOLIC PANEL
ALT: 7 U/L (ref 0–44)
AST: 9 U/L — ABNORMAL LOW (ref 15–41)
Albumin: 4 g/dL (ref 3.5–5.0)
Alkaline Phosphatase: 91 U/L (ref 38–126)
Anion gap: 10 (ref 5–15)
BUN: 5 mg/dL — ABNORMAL LOW (ref 6–20)
CO2: 21 mmol/L — ABNORMAL LOW (ref 22–32)
Calcium: 10 mg/dL (ref 8.9–10.3)
Chloride: 110 mmol/L (ref 98–111)
Creatinine, Ser: 0.67 mg/dL (ref 0.44–1.00)
GFR calc Af Amer: >60 mL/min (ref >60)
GFR calc non Af Amer: >60 mL/min (ref >60)
Glucose, Bld: 89 mg/dL (ref 70–99)
Potassium: 2.9 mmol/L — CL (ref 3.5–5.1)
Sodium: 141 mmol/L (ref 135–145)
Total Bilirubin: 0.7 mg/dL (ref 0.3–1.2)
Total Protein: 7.8 g/dL (ref 6.5–8.1)

## 2018-10-02 MED ORDER — POTASSIUM CHLORIDE ER 10 MEQ PO CPCR: 10.0000 meq | ORAL_CAPSULE | Freq: Two times a day (BID) | ORAL | 3 refills | Status: DC

## 2018-10-02 NOTE — Telephone Encounter (Signed)
See other entry 

## 2018-10-02 NOTE — Telephone Encounter (Signed)
Received call report from [facility].  "Today's K+ = 2.9."  Secure message sent to collaborative with results confirmed.

## 2018-10-02 NOTE — Telephone Encounter (Signed)
This RN spoke with pt per MD request for potassium replacement per lab result.  Karen Hopkins verbalized understanding.

## 2018-10-02 NOTE — Progress Notes (Signed)
[place]  [phone] [phone]   ID: Karen Hopkins   DOB: Oct 25, 1975  MR#: [id number]  SEG#:[id number]  Patient Care Team: [name], MD as PCP - General (Pediatrics) Melvenia Beam, MD as Consulting Physician (Neurology) Letta Pate Luanna Salk, MD as Consulting Physician (Physical Medicine and Rehabilitation) Dillingham, Loel Lofty, DO as Attending Physician (Plastic Surgery) Skeet Latch, MD as Attending Physician (Cardiology) Masaye Gatchalian, Virgie Dad, MD as Consulting Physician (Oncology) Montez Morita, MD as Referring Physician (Family Medicine) Neldon Mc, MD as Consulting Physician (General Surgery) Dillingham, Loel Lofty, DO as Attending Physician (Plastic Surgery) Erle Crocker, MD as Consulting Physician (Orthopedic Surgery)   CHIEF COMPLAINT:  Locally advanced right breast cancer  CURRENT TREATMENT: Observation   INTERVAL HISTORY: Karen Hopkins returns today for follow-up and treatment of her estrogen receptor positive breast cancer.   She had been receiving goserelin every 28 days.  However her last dose was on 04/17/2018.  She simply stopped coming for further treatments  Also she has not had mammography since in May 2018.   Finally she went off anastrozole at some point within the last several months, she does not remember exactly when.  Since her last visit, she broke her left ankle on 07/12/2018. On 07/18/2018 she underwent open treatment of a left trimalleolar ankle fracture under [facility].  The patient did lose her mother since her last visit here.  It will be a year in May 2020.  She is having a hard time coping.   REVIEW OF SYSTEMS: Karen Hopkins reports feeling "out of it" today. Her sons are home with her-- ages 52, 24, and 36. She continues to recover from her ankle surgery. The patient denies unusual headaches, visual changes, nausea, vomiting, stiff neck, dizziness, or gait imbalance. There has been no  cough, phlegm production, or pleurisy, no chest pain or pressure, and no change in bowel or bladder habits. The patient denies fever, rash, bleeding, unexplained fatigue or unexplained weight loss. A detailed review of systems was otherwise entirely negative.   HISTORY OF PRESENT ILLNESS: From the original intake note:  The patient noted swelling and redness, as well as some tenderness, over her right breast for some time. She thought this might be related to pregnancy, since she delivered her third child about 8 months ago. As the problem did not improve, she brought it to her primary care physician's attention. He treated her with Bactrim, which she says she could not tolerate because of nausea and vomiting, but which in any case did not resolve the problem, so he set her up for mammography at [facility] 08/26/2009. Dr. Sadie Haber found by exam marked skin thickening and erythema of the right breast extending pretty much throughout the breast. By mammography there were scattered fibroglandular densities, and an asymmetric density in the right upper outer quadrant with suspicious microcalcifications. There were also enlarged right axillary lymph nodes. The left appeared normal. By ultrasound there was an ill defined area of hypoechoic tissue in the right upper outer quadrant that was difficult to measure. There were also abnormal right axillary lymph nodes.  Ultrasound guided biopsy was performed the same day, and showed [phone]) a poorly differentiated invasive ductal carcinoma, which was estrogen receptor poor at 3%, progesterone receptor positive at 14% with an elevated proliferation marker at 50%, but importantly with strong amplification of HER-2 by CISH with a ratio of 5.20.  Bilateral breast MRIs were obtained 09/02/2009. This showed an area up to 10.6  cm in the right breast showing confluent mass and non-mass enhancement. There was marked skin thickening involving the entire right breast,  and numerous bulky right axillary lymph nodes were identified. The left breast was unremarkable, and there was no evidence of internal mammary lymph node involvement.  Patient was treated in the neoadjuvant setting with 4 dose dense cycles of doxorubicin and cyclophosphamide, followed by 12 weekly doses of paclitaxel and trastuzumab. Karen Hopkins underwent definitive right modified radical mastectomy in October 2011, with a residual 2.5 mm area of tumor in the breast, grade 3, and one of 21 lymph nodes involved.   Her subsequent history is as detailed above.   PAST MEDICAL HISTORY: Past Medical History:  Diagnosis Date  . Anxiety   . Cancer ([place]) 2011   Rt. Br. Ca  . Depression   . Essential hypertension 11/05/2015  . History of breast cancer 2011   right  . History of chemotherapy 2011  . History of radiation therapy 2011  . Hypertension    under control with med., has been on med. x 2 yr.  . Lymphedema of arm    right; no BP or puncture to right arm  . Personal history of chemotherapy 09/16/2009   rt breast  . Personal history of radiation therapy 05/2010   rt breast  . Pneumonia   . Seasonal allergies   . Sinus headache     PAST SURGICAL HISTORY: Past Surgical History:  Procedure Laterality Date  . BREAST BIOPSY Right 08/26/2009  . BREAST REDUCTION SURGERY Left 08/23/2013   Procedure: LEFT BREAST REDUCTION  ;  Surgeon: Theodoro Kos, DO;  Location: [facility];  Service: Plastics;  Laterality: Left;  . CESAREAN SECTION  07/15/2001; 03/18/2004; 11/21/2008  . lateral orbiotomy Left 11/2015   Dr. Toy Co[facility]key [facility]  . LATISSIMUS FLAP TO BREAST Right 03/12/2013   Procedure: RIGHT BREAST LATISSIMUS FLAP WITH EXPANDER PLACEMENT;  Surgeon: Theodoro Kos, DO;  Location: [facility];  Service: Plastics;  Laterality: Right;  . LIPOSUCTION Bilateral 08/23/2013   Procedure: LIPOSUCTION;  Surgeon: Theodoro Kos, DO;  Location: [facility];  Service: Plastics;  Laterality:  Bilateral;  . MASTECTOMY Right 2011  . MODIFIED RADICAL MASTECTOMY Right 03/11/2010  . NASAL SEPTUM SURGERY    . ORIF ANKLE FRACTURE Left 07/18/2018   Procedure: OPEN REDUCTION INTERNAL FIXATION (ORIF) LEFT ANKLE FRACTURE WITH  SYNDESMOSIS AND ANKLE ARTHROTOMY;  Surgeon: Erle Crocker, MD;  Location: [facility];  Service: Orthopedics;  Laterality: Left;  . PORT-A-CATH REMOVAL Left 12/01/2010  . PORTACATH PLACEMENT Left 09/12/2009  . REDUCTION MAMMAPLASTY Left 08/2013  . REMOVAL OF TISSUE EXPANDER AND PLACEMENT OF IMPLANT Right 08/23/2013   Procedure: REMOVAL RIGHT TISSUE EXPANDER AND PLACEMENT OF IMPLANT TO RIGHT BREAST ;  Surgeon: Theodoro Kos, DO;  Location: [facility];  Service: Plastics;  Laterality: Right;  . SUPRA-UMBILICAL HERNIA  1517  . TUBAL LIGATION  11/21/2008    FAMILY HISTORY Family History  Problem Relation Age of Onset  . Hypertension Mother   . Breast cancer Mother 34  . Diabetes type II Father   . Prostate cancer Father   . Stroke Neg Hx   The patient's mother was diagnosed with stage IV breast cancer in 2018.  Also of the patient's mother's mother's six sisters, two (the patient's great-aunts) had ovarian cancer.  GYNECOLOGIC HISTORY:   (Updated 11/26/2013) The patient is GX, P3. First child was premature. Age at first delivery, 43 years old.  she status post tubal ligation. She continues to have menstrual cycles, although irregularly.  SOCIAL HISTORY: (Updated April 2020) She is disabled. Aiva's children are [name], and [name]. They are 17, 14 and 9 as of 09/2018. They are all boys, all at home. The patient attends a non-denominational church in [place], which she considers her home, but she is currently living in [place].  ADVANCED DIRECTIVES: not in place  HEALTH MAINTENANCE:  (Updated 11/26/2013) Social History   Tobacco Use  . Smoking status: Former Smoker    Packs/day: 0.25    Years: 5.00    Pack years:  1.25    Types: Cigarettes    Start date: 06/07/2002    Last attempt to quit: 04/26/2010    Years since quitting: 8.4  . Smokeless tobacco: Never Used  Substance Use Topics  . Alcohol use: No  . Drug use: No    Colonoscopy: Never  PAP: April 2015  Bone density: Never  Lipid panel: August 2014    Allergies  Allergen Reactions  . Penicillins Swelling    FACIAL SWELLING Did it involve swelling of the face/tongue/throat, SOB, or low BP? Y Did it involve sudden or severe rash/hives, skin peeling, or any reaction on the inside of your mouth or nose? uknown Did you need to seek medical attention at a hospital or doctor's office? N When did it last happen?more than 10 years If all above answers are "NO", may proceed with cephalosporin use.   Recardo Evangelist [Pregabalin] Nausea Only    .  Marland Kitchen Meloxicam Nausea Only  . Robaxin [Methocarbamol] Nausea Only    Current Outpatient Medications  Medication Sig Dispense Refill  . albuterol (PROAIR HFA) 108 (90 Base) MCG/ACT inhaler Inhale 2 puffs into the lungs every 6 (six) hours as needed for wheezing or shortness of breath. (Patient not taking: Reported on 07/12/2018) 18 g 3  . diclofenac sodium (VOLTAREN) 1 % GEL Apply 4 g topically 4 (four) times daily. Use as directed (Patient not taking: Reported on 07/26/2018) 3 Tube 4  . polyethylene glycol powder (GLYCOLAX) powder Take 17 g by mouth daily. 500 g 5  . tiZANidine (ZANAFLEX) 4 MG tablet TAKE ONE (1) TABLET BY MOUTH 3 TIMES DAILY 90 tablet 3  . topiramate (TOPAMAX) 50 MG tablet Take 3 tablets (150 mg total) by mouth at bedtime. 90 tablet 3   No current facility-administered medications for this visit.     OBJECTIVE: Young African-American woman using crutches Vitals:   10/02/18 1113  BP: 125/73  Pulse: 80  Resp: 18  Temp: 99.1 F (37.3 C)  SpO2: 99%     Body mass index is 35.05 kg/m.    ECOG FS: 2   Filed Weights   10/02/18 1113  Weight: 204 lb 3.2 oz (92.6 kg)   Sclerae  unicteric, pupils round and equal No cervical or supraclavicular adenopathy Lungs no rales or rhonchi Heart regular rate and rhythm Abd soft, nontender, positive bowel sounds MSK no focal spinal tenderness, no upper extremity lymphedema Neuro: nonfocal, well oriented, appropriate affect Breasts: She has undergone right mastectomy and reconstruction.  There is no evidence of local recurrence.  The left breast is benign.  Both axillae are benign.   LAB RESULTS: Lab Results  Component Value Date   WBC 5.3 10/02/2018   NEUTROABS 2.5 10/02/2018   HGB 13.6 10/02/2018   HCT 41.4 10/02/2018   MCV 89.2 10/02/2018   PLT 236 10/02/2018      Chemistry  Component Value Date/Time   NA 142 10/18/2017 0930   NA 141 05/25/2017 1140   K 3.3 (L) 10/18/2017 0930   K 3.3 (L) 05/25/2017 1140   CL 112 (H) 10/18/2017 0930   CL 105 09/27/2012 1044   CO2 22 10/18/2017 0930   CO2 25 05/25/2017 1140   BUN 8 10/18/2017 0930   BUN 9.1 05/25/2017 1140   CREATININE 0.77 10/18/2017 0930   CREATININE 0.80 09/13/2017 1322   CREATININE 0.8 05/25/2017 1140      Component Value Date/Time   CALCIUM 9.3 10/18/2017 0930   CALCIUM 9.3 05/25/2017 1140   ALKPHOS 97 10/18/2017 0930   ALKPHOS 113 05/25/2017 1140   AST 19 10/18/2017 0930   AST 9 09/13/2017 1322   AST 9 05/25/2017 1140   ALT 19 10/18/2017 0930   ALT 9 09/13/2017 1322   ALT 11 05/25/2017 1140   BILITOT 0.3 10/18/2017 0930   BILITOT 0.3 09/13/2017 1322   BILITOT 0.23 05/25/2017 1140     STUDIES: She is behind on her mammography.  ASSESSMENT: 43 y.o.  BRCA1 and BRCA2 negative [place] woman, status post right breast biopsy in March 2011 for a high-grade invasive ductal carcinoma involving overlapping sites of the right breast, ER positive 3%, PR positive 14%, strongly HER2/neu positive with MIB-1 of 15%. Biopsy proven lymph node involvement at presentation. Changes consistent with inflammatory carcinoma.   1. Treated neoadjuvantly,  status post 4 dose-dense cycles of doxorubicin/cyclophosphamide, followed by 12 weekly doses of paclitaxel with trastuzumab completed September 2011.   2. Trastuzumab was then continued for a total of 1 year [to June 2012]. Post-treatment echo showed a well-preserved ejection fraction.   3. Status post right modified radical mastectomy in October 2011 showing a ypT1a ypN1 invasive ductal carcinoma, grade 3.  Status post right reconstruction with implant, March 2015   4. postmastectomy radiation completed in January 2012  5. on tamoxifen beginning January of 2012 stopped somewhere in Spring/Summer 2016  (a) anastrozole started November 2016, discontinued December 2019.  6. other problems include chronic postsurgical pain, chronic right upper extremity lymphedema, and chronic depression  7. Goserelin started 09/24/2014, discontinued November 2019.  PLAN:  Karen Hopkins is now 8-1/2 years out from definitive surgery for her breast cancer with no evidence of disease recurrence.  This is very favorable.  She essentially dropped out from breast cancer follow-up and treatment late last year, discontinuing her anastrozole and her goserelin.  She also has not had mammography in nearly 2 years.  Part of the issue is depression, but there are other issues as well and we did discuss that.  We had been refilling her alprazolam as a courtesy but she understands that we will not continue to do that in the future.  From this point we are starting observation alone and she is a very good candidate for our survivorship program.  I have scheduled her an appointment in October with my 39 assistant.  He is he is behind on mammography and I have scheduled her for mammogram in May.  She tells me she would like to have her reconstruction revised.  I am referring her back to her surgeon Dr. Marla Roe regarding that  Of course I will be glad to see Karen Hopkins again at any point in the future if and when the need  arises but as of now I am not making a routine return appointment for her with me here     Madinah Quarry, Virgie Dad, MD  10/02/18 11:36 AM  Medical Oncology and Hematology [facility] [address] [place], [place] [address] Tel. [phone]    Fax. [phone]   I, Wilburn Mylar, am acting as scribe for Dr. Virgie Dad. Marciano Mundt.  I, Lurline Del MD, have reviewed the above documentation for accuracy and completeness, and I agree with the above.

## 2018-10-03 ENCOUNTER — Other Ambulatory Visit: Payer: Self-pay | Admitting: Oncology

## 2018-10-03 DIAGNOSIS — M25672 Stiffness of left ankle, not elsewhere classified: Secondary | ICD-10-CM | POA: Diagnosis not present

## 2018-10-03 DIAGNOSIS — S82852D Displaced trimalleolar fracture of left lower leg, subsequent encounter for closed fracture with routine healing: Secondary | ICD-10-CM | POA: Diagnosis not present

## 2018-10-03 DIAGNOSIS — R262 Difficulty in walking, not elsewhere classified: Secondary | ICD-10-CM | POA: Diagnosis not present

## 2018-10-03 LAB — FOLLICLE STIMULATING HORMONE: FSH: 6.8 m[IU]/mL

## 2018-10-04 ENCOUNTER — Other Ambulatory Visit: Payer: Self-pay | Admitting: Oncology

## 2018-10-04 LAB — ESTRADIOL, ULTRA SENS: Estradiol, Sensitive: 145.1 pg/mL

## 2018-10-05 ENCOUNTER — Other Ambulatory Visit: Payer: Self-pay | Admitting: Oncology

## 2018-10-06 ENCOUNTER — Other Ambulatory Visit: Payer: Self-pay | Admitting: Oncology

## 2018-10-11 MED FILL — POTASSIUM CL ER 10 MEQ CAP: 10 | 15 days supply | Qty: 30 | Fill #0

## 2018-10-12 DIAGNOSIS — M25672 Stiffness of left ankle, not elsewhere classified: Secondary | ICD-10-CM | POA: Diagnosis not present

## 2018-10-12 DIAGNOSIS — R262 Difficulty in walking, not elsewhere classified: Secondary | ICD-10-CM | POA: Diagnosis not present

## 2018-10-12 DIAGNOSIS — S82852D Displaced trimalleolar fracture of left lower leg, subsequent encounter for closed fracture with routine healing: Secondary | ICD-10-CM | POA: Diagnosis not present

## 2018-10-17 DIAGNOSIS — M25672 Stiffness of left ankle, not elsewhere classified: Secondary | ICD-10-CM | POA: Diagnosis not present

## 2018-10-17 DIAGNOSIS — S82852D Displaced trimalleolar fracture of left lower leg, subsequent encounter for closed fracture with routine healing: Secondary | ICD-10-CM | POA: Diagnosis not present

## 2018-10-17 DIAGNOSIS — R262 Difficulty in walking, not elsewhere classified: Secondary | ICD-10-CM | POA: Diagnosis not present

## 2018-10-18 ENCOUNTER — Emergency Department (HOSPITAL_COMMUNITY): Payer: Medicare Other

## 2018-10-18 ENCOUNTER — Other Ambulatory Visit: Payer: Self-pay

## 2018-10-18 ENCOUNTER — Emergency Department (HOSPITAL_COMMUNITY)
Admission: EM | Admit: 2018-10-18 | Discharge: 2018-10-18 | Disposition: A | Discharge status: Home / Self Care | Payer: Medicare Other | Attending: Emergency Medicine | Admitting: Emergency Medicine

## 2018-10-18 DIAGNOSIS — B9689 Other specified bacterial agents as the cause of diseases classified elsewhere: Secondary | ICD-10-CM

## 2018-10-18 DIAGNOSIS — D259 Leiomyoma of uterus, unspecified: Secondary | ICD-10-CM | POA: Diagnosis not present

## 2018-10-18 DIAGNOSIS — Z79899 Other long term (current) drug therapy: Secondary | ICD-10-CM | POA: Diagnosis not present

## 2018-10-18 DIAGNOSIS — M25672 Stiffness of left ankle, not elsewhere classified: Secondary | ICD-10-CM | POA: Diagnosis not present

## 2018-10-18 DIAGNOSIS — N3090 Cystitis, unspecified without hematuria: Secondary | ICD-10-CM | POA: Insufficient documentation

## 2018-10-18 DIAGNOSIS — Z87891 Personal history of nicotine dependence: Secondary | ICD-10-CM | POA: Insufficient documentation

## 2018-10-18 DIAGNOSIS — I10 Essential (primary) hypertension: Secondary | ICD-10-CM | POA: Diagnosis not present

## 2018-10-18 DIAGNOSIS — N760 Acute vaginitis: Secondary | ICD-10-CM | POA: Diagnosis not present

## 2018-10-18 DIAGNOSIS — Z853 Personal history of malignant neoplasm of breast: Secondary | ICD-10-CM | POA: Diagnosis not present

## 2018-10-18 DIAGNOSIS — R1030 Lower abdominal pain, unspecified: Secondary | ICD-10-CM | POA: Diagnosis present

## 2018-10-18 LAB — URINALYSIS, ROUTINE W REFLEX MICROSCOPIC
Bilirubin Urine: NEGATIVE
Glucose, UA: NEGATIVE mg/dL
Ketones, ur: 5 mg/dL — AB
Nitrite: POSITIVE — AB
Protein, ur: NEGATIVE mg/dL
Specific Gravity, Urine: 1.017 (ref 1.005–1.030)
pH: 7 (ref 5.0–8.0)

## 2018-10-18 LAB — CBC WITH DIFFERENTIAL/PLATELET
Abs Immature Granulocytes: 0.01 10*3/uL (ref 0.00–0.07)
Basophils Absolute: 0 10*3/uL (ref 0.0–0.1)
Basophils Relative: 0 %
Eosinophils Absolute: 0.2 10*3/uL (ref 0.0–0.5)
Eosinophils Relative: 2 %
HCT: 40.5 % (ref 36.0–46.0)
Hemoglobin: 13.6 g/dL (ref 12.0–15.0)
Immature Granulocytes: 0 %
Lymphocytes Relative: 44 %
Lymphs Abs: 3.2 10*3/uL (ref 0.7–4.0)
MCH: 30.6 pg (ref 26.0–34.0)
MCHC: 33.6 g/dL (ref 30.0–36.0)
MCV: 91 fL (ref 80.0–100.0)
Monocytes Absolute: 0.4 10*3/uL (ref 0.1–1.0)
Monocytes Relative: 5 %
Neutro Abs: 3.5 10*3/uL (ref 1.7–7.7)
Neutrophils Relative %: 49 %
Platelets: 252 10*3/uL (ref 150–400)
RBC: 4.45 MIL/uL (ref 3.87–5.11)
RDW: 14 % (ref 11.5–15.5)
WBC: 7.2 10*3/uL (ref 4.0–10.5)
nRBC: 0 % (ref 0.0–0.2)

## 2018-10-18 LAB — COMPREHENSIVE METABOLIC PANEL
ALT: 11 U/L (ref 0–44)
AST: 12 U/L — ABNORMAL LOW (ref 15–41)
Albumin: 4.3 g/dL (ref 3.5–5.0)
Alkaline Phosphatase: 78 U/L (ref 38–126)
Anion gap: 4 — ABNORMAL LOW (ref 5–15)
BUN: 8 mg/dL (ref 6–20)
CO2: 19 mmol/L — ABNORMAL LOW (ref 22–32)
Calcium: 8.9 mg/dL (ref 8.9–10.3)
Chloride: 116 mmol/L — ABNORMAL HIGH (ref 98–111)
Creatinine, Ser: 0.63 mg/dL (ref 0.44–1.00)
GFR calc Af Amer: >60 mL/min (ref >60)
GFR calc non Af Amer: >60 mL/min (ref >60)
Glucose, Bld: 91 mg/dL (ref 70–99)
Potassium: 2.9 mmol/L — ABNORMAL LOW (ref 3.5–5.1)
Sodium: 139 mmol/L (ref 135–145)
Total Bilirubin: 0.6 mg/dL (ref 0.3–1.2)
Total Protein: 8 g/dL (ref 6.5–8.1)

## 2018-10-18 LAB — WET PREP, GENITAL
Sperm: NONE SEEN
Trich, Wet Prep: NONE SEEN
WBC, Wet Prep HPF POC: NONE SEEN
Yeast Wet Prep HPF POC: NONE SEEN

## 2018-10-18 LAB — LIPASE, BLOOD: Lipase: 37 U/L (ref 11–51)

## 2018-10-18 LAB — I-STAT BETA HCG BLOOD, ED (MC, WL, AP ONLY): I-stat hCG, quantitative: <5 m[IU]/mL (ref <5)

## 2018-10-18 MED ORDER — SODIUM CHLORIDE 0.9 % IV BOLUS
1000.0000 mL | Freq: Once | INTRAVENOUS | Status: AC
  Administered 2018-10-18: 16:00:00 1000 mL via INTRAVENOUS

## 2018-10-18 MED ORDER — METRONIDAZOLE 500 MG PO TABS: 500.0000 mg | ORAL_TABLET | Freq: Two times a day (BID) | ORAL | 0 refills | Status: DC

## 2018-10-18 MED ORDER — ONDANSETRON 4 MG PO TBDP: 4.0000 mg | ORAL_TABLET | Freq: Three times a day (TID) | ORAL | 0 refills | Status: DC | PRN

## 2018-10-18 MED ORDER — MORPHINE SULFATE (PF) 4 MG/ML IV SOLN
4.0000 mg | Freq: Once | INTRAVENOUS | Status: AC
  Administered 2018-10-18: 4 mg via INTRAVENOUS
  Filled 2018-10-18: qty 1

## 2018-10-18 MED ORDER — ONDANSETRON HCL 4 MG/2ML IJ SOLN
4.0000 mg | Freq: Once | INTRAMUSCULAR | Status: AC
  Administered 2018-10-18: 17:00:00 4 mg via INTRAVENOUS
  Filled 2018-10-18: qty 2

## 2018-10-18 MED ORDER — ONDANSETRON HCL 4 MG/2ML IJ SOLN
4.0000 mg | Freq: Once | INTRAMUSCULAR | Status: AC
  Administered 2018-10-18: 20:00:00 4 mg via INTRAVENOUS
  Filled 2018-10-18: qty 2

## 2018-10-18 MED ORDER — NITROFURANTOIN MONOHYD MACRO 100 MG PO CAPS: 100.0000 mg | ORAL_CAPSULE | Freq: Two times a day (BID) | ORAL | 0 refills | Status: DC

## 2018-10-18 NOTE — Discharge Instructions (Addendum)
There was evidence of urinary tract infection on the urinalysis.  There is also evidence of bacterial vaginosis on the wet prep. Please take all of your antibiotics until finished!   You may develop abdominal discomfort or diarrhea from the antibiotic.  You may help offset this with probiotics which you can buy or get in yogurt. Do not eat or take the probiotics until 2 hours after your antibiotic.   Antiinflammatory medications: Take 600 mg of ibuprofen every 6 hours or 440 mg (over the counter dose) to 500 mg (prescription dose) of naproxen every 12 hours for the next 3 days. After this time, these medications may be used as needed for pain. Take these medications with food to avoid upset stomach. Choose only one of these medications, do not take them together. Acetaminophen (generic for Tylenol): Should you continue to have additional pain while taking the ibuprofen or naproxen, you may add in acetaminophen as needed. Your daily total maximum amount of acetaminophen from all sources should be limited to 4000mg /day for persons without liver problems, or 2000mg /day for those with liver problems.  Nausea/vomiting: Use the ondansetron (generic for Zofran) for nausea or vomiting.  This medication may not prevent all vomiting or nausea, but can help facilitate better hydration. Things that can help with nausea/vomiting also include peppermint/menthol candies, vitamin B12, and ginger.  Follow-up with your primary care provider and OB/GYN.  Return to the emergency department for worsening symptoms.

## 2018-10-18 NOTE — ED Triage Notes (Signed)
Pt reports abdominal pain. N/V/D. Pt reports lack of appetite. Pt denies chest pain or SOB.  Pt reports she is a breast cancer survivor. Right arm restricted.

## 2018-10-18 NOTE — ED Provider Notes (Signed)
K[facility] DEPT Provider Note   CSN: [id number] Arrival date & time: 10/18/18  1453    History   Chief Complaint Chief Complaint  Patient presents Karen  . Abdominal Pain    HPI Karen Hopkins is a 43 y.o. Hopkins.     HPI  Karen Hopkins is a 43 y.o. Hopkins, Karen Hopkins, Karen Hopkins, Karen Hopkins, Karen Hopkins ED Karen abdominal pain for Hopkins last 2 weeks.   Pain began in Hopkins suprapubic region, cramping, waxing and waning, intermittently radiating across Hopkins lower abdomen, currently 6/10.  Accompanied by dysuria. Over Hopkins last 3 days, pain has worsened and has been accompanied by nausea, vomiting followed by dry heaving, and about 3 soft stools daily.  She has had decreased oral intake due to nausea.  She began to have epigastric pain following instances of dry heaving.  She began to have vaginal bleeding 3 days ago, stopped for 2 days, and restarted this morning.  Using about a pad an hour.  LMP April 20.  States this vaginal bleeding could just be Hopkins start of her menstrual cycle.  Patient has previous history of Karen Hopkins.  Her last chemo and other active treatments ended 7 years ago.  She was still on hormonal therapy until December 2019.  Her menstrual cycle restarted in March.  Denies fever/chills, syncope, chest pain, shortness of breath, cough, flank/back pain, hematochezia/melena, or any other complaints.   Past Medical History:  Diagnosis Date  . Hopkins   . Hopkins ([place]) 2011   Rt. Br. Ca  . Depression   . Essential hypertension 11/05/2015  . History of Karen Hopkins 2011   right  . History of chemotherapy 2011  . History of radiation therapy 2011  . Hypertension    under control Karen med., has been on med. x 2 yr.  . Lymphedema of arm    right; no BP or puncture to right arm  . Personal history of chemotherapy 09/16/2009   rt Karen  . Personal history of radiation therapy 05/2010   rt Karen  . Pneumonia   .  Seasonal allergies   . Sinus headache     Patient Active Problem List   Diagnosis Date Noted  . Lymphedema of right arm 02/27/2018  . Dyspnea 09/23/2016  . Eczema 08/31/2016  . Essential hypertension 11/05/2015  . Cervical dystonia 08/25/2015  . Headache 07/09/2015  . Myofascial muscle pain 04/29/2015  . Adhesive capsulitis of right shoulder 04/29/2015  . Type 2 diabetes mellitus without complication ([place]) 62/83/6629  . Lower extremity edema 10/01/2014  . Malignant neoplasm of overlapping sites of right Karen in Hopkins, estrogen receptor positive ([place]) 05/24/2014  . Myalgia and myositis 05/17/2014  . Neoplasm related pain 03/12/2014  . CN (constipation) 11/26/2013  . H/O reduction mammoplasty 10/30/2013  . Seizure ([place]) 10/06/2013  . Status post bilateral Karen implants 08/31/2013  . S/P Karen reconstruction, right 08/23/2013  . AC joint arthropathy 07/02/2013  . Routine adult health maintenance 05/29/2013  . Hypokalemia 05/21/2013  . Chronic pain 04/19/2013  . Elevated blood sugar 03/09/2013  . 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 RHINOSINUSITIS, CHRONIC 04/08/2010  . Obesity 08/04/2006  . DEPRESSIVE DISORDER, NOS 08/04/2006    Past Surgical History:  Procedure Laterality Date  . Karen BIOPSY Right 08/26/2009  . Karen REDUCTION SURGERY Left 08/23/2013   Procedure: LEFT Karen REDUCTION  ;  Surgeon: Theodoro Kos, DO;  Location: [facility];  Service: Clinical cytogeneticist;  Laterality: Left;  . CESAREAN SECTION  07/15/2001; 03/18/2004; 11/21/2008  . lateral orbiotomy Left 11/2015   Dr. Toy Cookey at [facility]  . LATISSIMUS FLAP TO Karen Right 03/12/2013   Procedure: RIGHT Karen LATISSIMUS FLAP Karen EXPANDER PLACEMENT;  Surgeon: Theodoro Kos, DO;  Location: [place];  Service: Plastics;  Laterality: Right;  . LIPOSUCTION Bilateral 08/23/2013   Procedure: LIPOSUCTION;  Surgeon: Theodoro Kos, DO;  Location: [place];  Service: Plastics;   Laterality: Bilateral;  . MASTECTOMY Right 2011  . MODIFIED RADICAL MASTECTOMY Right 03/11/2010  . NASAL SEPTUM SURGERY    . ORIF ANKLE FRACTURE Left 07/18/2018   Procedure: OPEN REDUCTION INTERNAL FIXATION (ORIF) LEFT ANKLE FRACTURE Karen  SYNDESMOSIS AND ANKLE ARTHROTOMY;  Surgeon: Erle Crocker, MD;  Location: [place];  Service: Orthopedics;  Laterality: Left;  . PORT-A-CATH REMOVAL Left 12/01/2010  . PORTACATH PLACEMENT Left 09/12/2009  . REDUCTION MAMMAPLASTY Left 08/2013  . REMOVAL OF TISSUE EXPANDER AND PLACEMENT OF IMPLANT Right 08/23/2013   Procedure: REMOVAL RIGHT TISSUE EXPANDER AND PLACEMENT OF IMPLANT TO RIGHT Karen ;  Surgeon: Theodoro Kos, DO;  Location: [place];  Service: Plastics;  Laterality: Right;  . SUPRA-UMBILICAL HERNIA  [id number]  . TUBAL LIGATION  11/21/2008     OB History    Gravida  3   Para  3   Term  2   Preterm  1   AB      Living  2     SAB      TAB      Ectopic      Multiple      Live Births  2            Home Medications    Prior to Admission medications   Medication Sig Start Date End Date Taking? Authorizing Provider  albuterol (PROAIR HFA) 108 (90 Base) MCG/ACT inhaler Inhale 2 puffs into Hopkins lungs every 6 (six) hours as needed for wheezing or shortness of breath. 10/12/16  Yes [name], MD  diclofenac sodium (VOLTAREN) 1 % GEL Apply 4 g topically 4 (four) times daily. Use as directed 09/19/17  Yes Danella Sensing L, NP  potassium chloride (MICRO-K) 10 MEQ CR capsule Take 1 capsule (10 mEq total) by mouth 2 (two) times daily. 10/02/18  Yes Magrinat, Virgie Dad, MD  tiZANidine (ZANAFLEX) 4 MG tablet TAKE ONE (1) TABLET BY MOUTH 3 TIMES DAILY Patient taking differently: Take 4 mg by mouth every 8 (eight) hours as needed for muscle spasms.  02/27/18  Yes Kirsteins, Luanna Salk, MD  topiramate (TOPAMAX) 50 MG tablet Take 3 tablets (150 mg total) by mouth at bedtime. 06/09/18  Yes Venancio Poisson,  NP  metroNIDAZOLE (FLAGYL) 500 MG tablet Take 1 tablet (500 mg total) by mouth 2 (two) times daily. 10/18/18   Joy, Shawn C, PA-C  nitrofurantoin, macrocrystal-monohydrate, (MACROBID) 100 MG capsule Take 1 capsule (100 mg total) by mouth 2 (two) times daily. 10/18/18   Joy, Shawn C, PA-C  ondansetron (ZOFRAN ODT) 4 MG disintegrating tablet Take 1 tablet (4 mg total) by mouth every 8 (eight) hours as needed for nausea or vomiting. 10/18/18   Joy, Shawn C, PA-C  polyethylene glycol powder (GLYCOLAX) powder Take 17 g by mouth daily. Patient not taking: Reported on 10/18/2018 05/23/17   Magrinat, Virgie Dad, MD    Family History Family History  Problem Relation Age of Onset  . Hypertension Mother   .  Karen Hopkins Mother 36  . Diabetes type II Father   . Prostate Hopkins Father   . Stroke Neg Hx     Social History Social History   Tobacco Use  . Smoking status: Former Smoker    Packs/day: 0.25    Years: 5.00    Pack years: 1.25    Types: Cigarettes    Start date: 06/07/2002    Last attempt to quit: 04/26/2010    Years since quitting: 8.4  . Smokeless tobacco: Never Used  Substance Use Topics  . Alcohol use: No  . Drug use: No     Allergies   Penicillins; [place]rica [pregabalin]; Meloxicam; and Robaxin [methocarbamol]   Review of Systems Review of Systems  Constitutional: Positive for fatigue. Negative for chills, diaphoresis and fever.  Respiratory: Negative for cough and shortness of breath.   Cardiovascular: Negative for chest pain.  Gastrointestinal: Positive for abdominal pain, diarrhea, nausea and vomiting. Negative for blood in stool.  Genitourinary: Positive for dysuria and vaginal bleeding.  All other systems reviewed and are negative.    Physical Exam Updated Vital Signs BP (!) 141/88 (BP Location: Left Arm)   Pulse 81   Temp 98.2 F (36.8 C) (Oral)   Resp 17   LMP 07/22/2014 (Approximate)   SpO2 100%   Physical Exam Vitals signs and nursing note reviewed.   Constitutional:      General: She is not in acute distress.    Appearance: She is well-developed. She is not diaphoretic.  HENT:     Head: Normocephalic and atraumatic.     Mouth/Throat:     Mouth: Mucous membranes are moist.     Pharynx: Oropharynx is clear.  Eyes:     Conjunctiva/sclera: Conjunctivae normal.  Neck:     Musculoskeletal: Neck supple.  Cardiovascular:     Rate and Rhythm: Normal rate and regular rhythm.     Pulses: Normal pulses.          Radial pulses are 2+ on Hopkins right side and 2+ on Hopkins left side.       Posterior tibial pulses are 2+ on Hopkins right side and 2+ on Hopkins left side.     Heart sounds: Normal heart sounds.     Comments: Tactile temperature in Hopkins extremities appropriate and equal bilaterally. Pulmonary:     Effort: Pulmonary effort is normal. No respiratory distress.     Breath sounds: Normal breath sounds.  Abdominal:     Palpations: Abdomen is soft.     Tenderness: There is abdominal tenderness. There is no guarding.       Comments: Patient verbalizes tenderness in Hopkins regions indicated, but has no other reaction.  Genitourinary:    Comments: External genitalia normal Vagina Karen pooling blood in Hopkins vaginal vault Cervix  normal negative for cervical motion tenderness Adnexa palpated, no masses, positive for tenderness noted, mostly on Hopkins right. Bladder palpated negative for tenderness Uterus palpated no masses, positive for tenderness  No inguinal lymphadenopathy. Otherwise normal Hopkins genitalia. RN, Becky Sax, served as Producer, television/film/video during exam. Musculoskeletal:     Right lower leg: No edema.     Left lower leg: No edema.  Lymphadenopathy:     Cervical: No cervical adenopathy.  Skin:    General: Skin is warm and dry.  Neurological:     Mental Status: She is alert.  Psychiatric:        Mood and Affect: Mood and affect normal.        Speech: Speech  normal.        Behavior: Behavior normal.      ED Treatments / Results  Labs (all  labs ordered are listed, but only abnormal results are displayed) Labs Reviewed  WET PREP, GENITAL - Abnormal; Notable for Hopkins following components:      Result Value   Clue Cells Wet Prep HPF POC PRESENT (*)    All other components within normal limits  COMPREHENSIVE METABOLIC PANEL - Abnormal; Notable for Hopkins following components:   Potassium 2.9 (*)    Chloride 116 (*)    CO2 19 (*)    AST 12 (*)    Anion gap 4 (*)    All other components within normal limits  URINALYSIS, ROUTINE W REFLEX MICROSCOPIC - Abnormal; Notable for Hopkins following components:   APPearance CLOUDY (*)    Hgb urine dipstick LARGE (*)    Ketones, ur 5 (*)    Nitrite POSITIVE (*)    Leukocytes,Ua SMALL (*)    Bacteria, UA MANY (*)    All other components within normal limits  URINE CULTURE  CBC Karen DIFFERENTIAL/PLATELET  LIPASE, BLOOD  RPR  HIV ANTIBODY (ROUTINE TESTING W REFLEX)  I-STAT BETA HCG BLOOD, ED ([facility], [facility], [facility] ONLY)  GC/CHLAMYDIA PROBE AMP ([place]) NOT AT [facility]    EKG None  Radiology US Transvaginal Non-ob  Result Date: 10/18/2018 CLINICAL DATA:  Lower abdominal pain and right adnexal tenderness. EXAM: TRANSABDOMINAL AND TRANSVAGINAL ULTRASOUND OF PELVIS DOPPLER ULTRASOUND OF OVARIES TECHNIQUE: Both transabdominal and transvaginal ultrasound examinations of Hopkins pelvis were performed. Transabdominal technique was performed for global imaging of Hopkins pelvis including uterus, ovaries, adnexal regions, and pelvic cul-de-sac. It was necessary to proceed Karen endovaginal exam following Hopkins transabdominal exam to visualize Hopkins endometrium and adnexa. Color and duplex Doppler ultrasound was utilized to evaluate blood flow to Hopkins ovaries. COMPARISON:  None. FINDINGS: Uterus Measurements: 9.9 x 4.5 x 5.8 cm = volume: 123 mL. Left fundal fibroid measuring 4.0 x 4.5 x 4.1 cm. Endometrium Thickness: 5 mm.  No focal abnormality visualized. Right ovary Measurements: 2.6 x 1.7 x 1.7 cm = volume: 4 mL. Normal  appearance/no adnexal mass. Left ovary Not visualized. Pulsed Doppler evaluation of Hopkins right ovary demonstrates normal low-resistance arterial and venous waveforms. Other findings No abnormal free fluid. IMPRESSION: 1. No acute abnormality.  Normal right ovary. 2. Nonvisualized left ovary. 3. 4.5 cm uterine fibroid. Electronically Signed   By: Titus Dubin M.D.   On: 10/18/2018 18:44   US Pelvis Complete  Result Date: 10/18/2018 CLINICAL DATA:  Lower abdominal pain and right adnexal tenderness. EXAM: TRANSABDOMINAL AND TRANSVAGINAL ULTRASOUND OF PELVIS DOPPLER ULTRASOUND OF OVARIES TECHNIQUE: Both transabdominal and transvaginal ultrasound examinations of Hopkins pelvis were performed. Transabdominal technique was performed for global imaging of Hopkins pelvis including uterus, ovaries, adnexal regions, and pelvic cul-de-sac. It was necessary to proceed Karen endovaginal exam following Hopkins transabdominal exam to visualize Hopkins endometrium and adnexa. Color and duplex Doppler ultrasound was utilized to evaluate blood flow to Hopkins ovaries. COMPARISON:  None. FINDINGS: Uterus Measurements: 9.9 x 4.5 x 5.8 cm = volume: 123 mL. Left fundal fibroid measuring 4.0 x 4.5 x 4.1 cm. Endometrium Thickness: 5 mm.  No focal abnormality visualized. Right ovary Measurements: 2.6 x 1.7 x 1.7 cm = volume: 4 mL. Normal appearance/no adnexal mass. Left ovary Not visualized. Pulsed Doppler evaluation of Hopkins right ovary demonstrates normal low-resistance arterial and venous waveforms. Other findings No abnormal free fluid. IMPRESSION: 1. No acute  abnormality.  Normal right ovary. 2. Nonvisualized left ovary. 3. 4.5 cm uterine fibroid. Electronically Signed   By: Titus Dubin M.D.   On: 10/18/2018 18:44   [place] Art/ven Flow Abd Pelv Doppler  Result Date: 10/18/2018 CLINICAL DATA:  Lower abdominal pain and right adnexal tenderness. EXAM: TRANSABDOMINAL AND TRANSVAGINAL ULTRASOUND OF PELVIS DOPPLER ULTRASOUND OF OVARIES TECHNIQUE: Both  transabdominal and transvaginal ultrasound examinations of Hopkins pelvis were performed. Transabdominal technique was performed for global imaging of Hopkins pelvis including uterus, ovaries, adnexal regions, and pelvic cul-de-sac. It was necessary to proceed Karen endovaginal exam following Hopkins transabdominal exam to visualize Hopkins endometrium and adnexa. Color and duplex Doppler ultrasound was utilized to evaluate blood flow to Hopkins ovaries. COMPARISON:  None. FINDINGS: Uterus Measurements: 9.9 x 4.5 x 5.8 cm = volume: 123 mL. Left fundal fibroid measuring 4.0 x 4.5 x 4.1 cm. Endometrium Thickness: 5 mm.  No focal abnormality visualized. Right ovary Measurements: 2.6 x 1.7 x 1.7 cm = volume: 4 mL. Normal appearance/no adnexal mass. Left ovary Not visualized. Pulsed Doppler evaluation of Hopkins right ovary demonstrates normal low-resistance arterial and venous waveforms. Other findings No abnormal free fluid. IMPRESSION: 1. No acute abnormality.  Normal right ovary. 2. Nonvisualized left ovary. 3. 4.5 cm uterine fibroid. Electronically Signed   By: Titus Dubin M.D.   On: 10/18/2018 18:44    Procedures Pelvic exam Date/Time: 10/18/2018 4:35 PM Performed by: Lorayne Bender, PA-C Authorized by: Lorayne Bender, PA-C  Consent: Verbal consent obtained. Risks and benefits: risks, benefits and alternatives were discussed Consent given by: patient Patient understanding: patient states understanding of Hopkins procedure being performed Patient identity confirmed: verbally Karen patient and provided demographic data Local anesthesia used: no  Anesthesia: Local anesthesia used: no  Sedation: Patient sedated: no  Patient tolerance: Patient tolerated Hopkins procedure well Karen no immediate complications    (including critical care time)  Medications Ordered in ED Medications  sodium chloride 0.9 % bolus 1,000 mL (1,000 mLs Intravenous New Bag/Given 10/18/18 1617)  morphine 4 MG/ML injection 4 mg (4 mg Intravenous Given  10/18/18 1658)  ondansetron (ZOFRAN) injection 4 mg (4 mg Intravenous Given 10/18/18 1658)     Initial Impression / Assessment and Plan / ED Course  I have reviewed Hopkins triage vital signs and Hopkins nursing notes.  Pertinent labs & imaging results that were available during my care of Hopkins patient were reviewed by me and considered in my medical decision making (see chart for details).  Clinical Course as of Oct 17 1929  Wed Oct 18, 2018  1705 Consistent Karen previous values. Has recently started potassium supplementation around 5/6. Has improved.   Potassium(!): 2.9 [SJ]  1705 Discussed lab values Karen patient.  She is now pain-free.   [SJ]    Clinical Course User Index [SJ] Joy, Shawn C, PA-C       Patient presents Karen lower abdominal pain and dysuria.  Also complains of diarrhea, nausea, and vaginal bleeding.  Patient is nontoxic appearing, afebrile, not tachycardic, not tachypneic, not hypotensive, maintains excellent SPO2 on room air, and is in no apparent distress.  No leukocytosis.  UA Karen evidence of UTI.  Wet prep Karen evidence of bacterial vaginosis.  Uterine fibroid noted on ultrasound.  Vaginal bleeding may be due to patient's menstrual cycle, may be more painful due to her fibroid.  She has never had fibroids before as far as she knows.  Hemoglobin normal.  Pregnancy test negative.  Repeat abdominal exams were  benign.  Surgical emergencies were considered, but thought less likely due to patient vital signs, lab results, and physical exam findings, combined Karen Hopkins patient's description of pain and timeline.  She will follow-up Karen OB/GYN as well as her PCP.  Tolerating PO at time of discharge. Hopkins patient was given instructions for home care as well as return precautions. Patient voices understanding of these instructions, accepts Hopkins plan, and is comfortable Karen discharge.  Labs and radiological studies were personally reviewed by me.  Arneda D Dimock was evaluated in  Emergency Department on 10/18/2018 for Hopkins symptoms described in Hopkins history of present illness. She was evaluated in Hopkins context of Hopkins global COVID-19 pandemic, which necessitated consideration that Hopkins patient might be at risk for infection Karen Hopkins SARS-CoV-2 virus that causes COVID-19. Institutional protocols and algorithms that pertain to Hopkins evaluation of patients at risk for COVID-19 are in a state of rapid change based on information released by regulatory bodies including Hopkins CDC and federal and state organizations. These policies and algorithms were followed during Hopkins patient's care in Hopkins ED.   Final Clinical Impressions(s) / ED Diagnoses   Final diagnoses:  Cystitis  Uterine leiomyoma, unspecified location  BV (bacterial vaginosis)    ED Discharge Orders         Ordered    ondansetron (ZOFRAN ODT) 4 MG disintegrating tablet  Every 8 hours PRN     10/18/18 1925    nitrofurantoin, macrocrystal-monohydrate, (MACROBID) 100 MG capsule  2 times daily     10/18/18 1925    metroNIDAZOLE (FLAGYL) 500 MG tablet  2 times daily     10/18/18 1925           Layla Maw 10/18/18 2010    Isla Pence, MD 10/20/18 [phone]

## 2018-10-19 ENCOUNTER — Ambulatory Visit (INDEPENDENT_AMBULATORY_CARE_PROVIDER_SITE_OTHER): Payer: Medicare Other | Admitting: Obstetrics & Gynecology

## 2018-10-19 ENCOUNTER — Telehealth: Payer: Self-pay | Admitting: Obstetrics & Gynecology

## 2018-10-19 ENCOUNTER — Encounter: Payer: Self-pay | Admitting: Obstetrics & Gynecology

## 2018-10-19 ENCOUNTER — Other Ambulatory Visit (HOSPITAL_COMMUNITY)
Admission: RE | Admit: 2018-10-19 | Discharge: 2018-10-19 | Disposition: A | Discharge status: Home / Self Care | Payer: Medicare Other | Source: Ambulatory Visit | Attending: Obstetrics & Gynecology | Admitting: Obstetrics & Gynecology

## 2018-10-19 ENCOUNTER — Telehealth: Payer: Self-pay

## 2018-10-19 VITALS — BP 122/82 | HR 96 | Temp 97.9°F | Ht 64.0 in | Wt 200.0 lb

## 2018-10-19 DIAGNOSIS — R87619 Unspecified abnormal cytological findings in specimens from cervix uteri: Secondary | ICD-10-CM | POA: Insufficient documentation

## 2018-10-19 DIAGNOSIS — N926 Irregular menstruation, unspecified: Secondary | ICD-10-CM

## 2018-10-19 DIAGNOSIS — R634 Abnormal weight loss: Secondary | ICD-10-CM

## 2018-10-19 DIAGNOSIS — D251 Intramural leiomyoma of uterus: Secondary | ICD-10-CM

## 2018-10-19 DIAGNOSIS — Z124 Encounter for screening for malignant neoplasm of cervix: Secondary | ICD-10-CM

## 2018-10-19 DIAGNOSIS — R1013 Epigastric pain: Secondary | ICD-10-CM

## 2018-10-19 DIAGNOSIS — R87810 Cervical high risk human papillomavirus (HPV) DNA test positive: Secondary | ICD-10-CM | POA: Insufficient documentation

## 2018-10-19 DIAGNOSIS — Z1151 Encounter for screening for human papillomavirus (HPV): Secondary | ICD-10-CM | POA: Insufficient documentation

## 2018-10-19 DIAGNOSIS — R110 Nausea: Secondary | ICD-10-CM | POA: Diagnosis not present

## 2018-10-19 LAB — RPR: RPR Ser Ql: NONREACTIVE

## 2018-10-19 LAB — HIV ANTIBODY (ROUTINE TESTING W REFLEX): HIV Screen 4th Generation wRfx: NONREACTIVE

## 2018-10-19 LAB — GC/CHLAMYDIA PROBE AMP (~~LOC~~) NOT AT ARMC
Chlamydia: NEGATIVE
Neisseria Gonorrhea: NEGATIVE

## 2018-10-19 MED ORDER — FLUCONAZOLE 150 MG PO TABS: ORAL_TABLET | ORAL | 0 refills | Status: DC

## 2018-10-19 MED FILL — NITROFURANTOIN MONO-MCR 100: 100 | 5 days supply | Qty: 10 | Fill #0

## 2018-10-19 MED FILL — FLUCONAZOLE 150 MG TABS: 150 | 3 days supply | Qty: 2 | Fill #0

## 2018-10-19 MED FILL — ONDANSETRON ODT 4 MG TABLET: 4 | 7 days supply | Qty: 20 | Fill #0

## 2018-10-19 MED FILL — METRONIDAZOLE 500 MG TABS: 500 | 7 days supply | Qty: 14 | Fill #0

## 2018-10-19 NOTE — Telephone Encounter (Signed)
Pt needs follow up ultrasound in 3 months.  Can you please call and schedule?

## 2018-10-19 NOTE — Telephone Encounter (Signed)
Spoke with patient. Patient was seen in the ER on 10/18/2018 for abdominal pain for 2 weeks. Pain was a 6/10 yesterday. Denies any pain now. States that she feels "bloated, heavy, and swollen." Patient is currently bleeding. Changing a pad every 4 hours. Bleeding began 3 days ago. LMP prior to this was 09/25/2018. Patient had a PUS in the ER which showed a 4.5 cm uterine fibroid. Patient is very uncomfortable and is unsure what to do next. Was advised to follow up with Dr.Miller and PCP. Advised will speak with Dr.Miller and return call.

## 2018-10-19 NOTE — Telephone Encounter (Signed)
Erroneous encounter

## 2018-10-19 NOTE — Telephone Encounter (Signed)
See telephone call dated 10/19/2018. Encounter closed.

## 2018-10-19 NOTE — Telephone Encounter (Signed)
Reviewed with Dr.Miller. Appointment scheduled for today at 3 pm with Dr.Miller. Patient is agreeable to date and time.  Routing to provider and will close encounter.

## 2018-10-19 NOTE — Progress Notes (Signed)
GYNECOLOGY  VISIT  CC:   ED follow up  HPI: 43 y.o. [id number] [name] here for discussion of changes with bleeding and ER visit yesterday.  Pt went to ER due to heavy bleeding which has improved as well as abdominal pain and cramping.  Ultrasound was performed showing 10 x 4.5 x 6cm uterus with left fundal fibroid measuring 4 x 4.5 x 4.1cm.  There are no recent comparison ultrasounds.    Pt has hx of ER positive breast cancer.  She has been followed by Dr. Jana Hakim.  She stopped her treatment with anastrozole and with goserelin monthly several months ago.  She just did this on her own without advice from Dr. Jana Hakim.  With recent visit, as she is close to 10 years from diagnosis, he recommended she stay off any therapy.    Since stopping the treatments, her cycles have restarted.  They are every 3-6 weeks and lasting 5-6 days.  This most recent one was only three weeks from the prior.  Flow is now dark and much lighter.  She is not having any clots.  Denies SOB, palpitations, light headedness.   She does need a pap smear today as she had one about a year ago that was abnormal and was evaluated with a colposcopy.      Has boyfriend that she's been together with for about a year.  STD testing was done in the ER.  She was diagnosed with a UTI in the ER as well as with BV.  She took one dose of flagyl and stopped due to the taste.  She's only taken one macrobid as well.  As she didn't know which medication was for what problem, she didn't know which one to continue.  Reviewed medications with her.  She will finish the macrobid.  Lastly, she has lost about 25 pounds since I saw her last.  Reports she's been having a lot of nausea and, at times, post prandial pain that is in the upper mid abdomen.  Reviewed ER lab work with pt.  She has not seen GI so referral will be made.  GYNECOLOGIC HISTORY: Patient's last menstrual period was 07/22/2014 (approximate). Contraception:  PMP, BTL Menopausal hormone therapy: none  Patient Active Problem List   Diagnosis Date Noted  . Lymphedema of right arm 02/27/2018  . Dyspnea 09/23/2016  . Eczema 08/31/2016  . Essential hypertension 11/05/2015  . Cervical dystonia 08/25/2015  . Headache 07/09/2015  . Myofascial muscle pain 04/29/2015  . Adhesive capsulitis of right shoulder 04/29/2015  . Type 2 diabetes mellitus without complication ([place]) 40/81/4481  . Lower extremity edema 10/01/2014  . Malignant neoplasm of overlapping sites of right breast in female, estrogen receptor positive ([place]) 05/24/2014  . Myalgia and myositis 05/17/2014  . Neoplasm related pain 03/12/2014  . CN (constipation) 11/26/2013  . H/O reduction mammoplasty 10/30/2013  . Seizure ([place]) 10/06/2013  . Status post bilateral breast implants 08/31/2013  . S/P breast reconstruction, right 08/23/2013  . AC joint arthropathy 07/02/2013  . Routine adult health maintenance 05/29/2013  . Hypokalemia 05/21/2013  . Chronic pain 04/19/2013  . Elevated blood sugar 03/09/2013  . Acquired absence of breast and absent nipple 01/30/2013  . RHINOSINUSITIS, CHRONIC 04/08/2010  . Obesity 08/04/2006  . DEPRESSIVE DISORDER, NOS 08/04/2006    Past Medical History:  Diagnosis Date  . Anxiety   . Cancer ([place]) 2011   Rt. Br. Ca  . Depression   . Essential hypertension 11/05/2015  .  History of breast cancer 2011   right  . History of chemotherapy 2011  . History of radiation therapy 2011  . Hypertension    under control with med., has been on med. x 2 yr.  . Lymphedema of arm    right; no BP or puncture to right arm  . Personal history of chemotherapy 09/16/2009   rt breast  . Personal history of radiation therapy 05/2010   rt breast  . Pneumonia   . Seasonal allergies   . Sinus headache     Past Surgical History:  Procedure Laterality Date  . BREAST BIOPSY Right 08/26/2009  . BREAST REDUCTION SURGERY Left 08/23/2013   Procedure: LEFT BREAST  REDUCTION  ;  Surgeon: Theodoro Kos, DO;  Location: [place];  Service: Plastics;  Laterality: Left;  . CESAREAN SECTION  07/15/2001; 03/18/2004; 11/21/2008  . lateral orbiotomy Left 11/2015   Dr. [facility] at [facility]  . LATISSIMUS FLAP TO BREAST Right 03/12/2013   Procedure: RIGHT BREAST LATISSIMUS FLAP WITH EXPANDER PLACEMENT;  Surgeon: Theodoro Kos, DO;  Location: [place];  Service: Plastics;  Laterality: Right;  . LIPOSUCTION Bilateral 08/23/2013   Procedure: LIPOSUCTION;  Surgeon: Theodoro Kos, DO;  Location: [place];  Service: Plastics;  Laterality: Bilateral;  . MASTECTOMY Right 2011  . MODIFIED RADICAL MASTECTOMY Right 03/11/2010  . NASAL SEPTUM SURGERY    . ORIF ANKLE FRACTURE Left 07/18/2018   Procedure: OPEN REDUCTION INTERNAL FIXATION (ORIF) LEFT ANKLE FRACTURE WITH  SYNDESMOSIS AND ANKLE ARTHROTOMY;  Surgeon: Erle Crocker, MD;  Location: [place];  Service: Orthopedics;  Laterality: Left;  . PORT-A-CATH REMOVAL Left 12/01/2010  . PORTACATH PLACEMENT Left 09/12/2009  . REDUCTION MAMMAPLASTY Left 08/2013  . REMOVAL OF TISSUE EXPANDER AND PLACEMENT OF IMPLANT Right 08/23/2013   Procedure: REMOVAL RIGHT TISSUE EXPANDER AND PLACEMENT OF IMPLANT TO RIGHT BREAST ;  Surgeon: Theodoro Kos, DO;  Location: [place];  Service: Plastics;  Laterality: Right;  . SUPRA-UMBILICAL HERNIA  1017  . TUBAL LIGATION  11/21/2008    MEDS:   Current Outpatient Medications on File Prior to Visit  Medication Sig Dispense Refill  . albuterol (PROAIR HFA) 108 (90 Base) MCG/ACT inhaler Inhale 2 puffs into the lungs every 6 (six) hours as needed for wheezing or shortness of breath. 18 g 3  . diclofenac sodium (VOLTAREN) 1 % GEL Apply 4 g topically 4 (four) times daily. Use as directed 3 Tube 4  . metroNIDAZOLE (FLAGYL) 500 MG tablet Take 1 tablet (500 mg total) by mouth 2 (two) times daily. 14 tablet 0  . nitrofurantoin, macrocrystal-monohydrate,  (MACROBID) 100 MG capsule Take 1 capsule (100 mg total) by mouth 2 (two) times daily. 10 capsule 0  . ondansetron (ZOFRAN ODT) 4 MG disintegrating tablet Take 1 tablet (4 mg total) by mouth every 8 (eight) hours as needed for nausea or vomiting. 20 tablet 0  . polyethylene glycol powder (GLYCOLAX) powder Take 17 g by mouth daily. (Patient not taking: Reported on 10/18/2018) 500 g 5  . potassium chloride (MICRO-K) 10 MEQ CR capsule Take 1 capsule (10 mEq total) by mouth 2 (two) times daily. 30 capsule 3  . tiZANidine (ZANAFLEX) 4 MG tablet TAKE ONE (1) TABLET BY MOUTH 3 TIMES DAILY (Patient taking differently: Take 4 mg by mouth every 8 (eight) hours as needed for muscle spasms. ) 90 tablet 3  . topiramate (TOPAMAX) 50 MG tablet Take 3 tablets (150 mg total) by mouth  at bedtime. 90 tablet 3   No current facility-administered medications on file prior to visit.     ALLERGIES: Penicillins; Lyrica [pregabalin]; Meloxicam; and Robaxin [methocarbamol]  Family History  Problem Relation Age of Onset  . Hypertension Mother   . Breast cancer Mother 24  . Diabetes type II Father   . Prostate cancer Father   . Stroke Neg Hx     SH:  Single, non smoker  Review of Systems  Gastrointestinal: Positive for abdominal distention, abdominal pain and nausea.  Genitourinary: Positive for vaginal bleeding.  All other systems reviewed and are negative.   PHYSICAL EXAMINATION:    BP 122/82   Pulse 96   Temp 97.9 F (36.6 C) (Temporal)   Ht 5\' 4"  (1.626 m)   Wt 200 lb (90.7 kg)   LMP 07/22/2014 (Approximate)   BMI 34.33 kg/m     General appearance: alert, cooperative and appears stated age Abdomen: soft, non-tender; bowel sounds normal; no masses,  no organomegaly Lymph:  no inguinal LAD noted  Pelvic: External genitalia:  no lesions              Urethra:  normal appearing urethra with no masses, tenderness or lesions              Bartholins and Skenes: normal                 Vagina: normal  appearing vagina with normal color and discharge, no lesions              Cervix: no lesions              Bimanual Exam:  Uterus:  enlarged, 10, full and globular weeks size              Adnexa: no mass, fullness, tenderness  Chaperone was present for exam.  Assessment: 4.5cm left uterine fibroid Irregular bleeding H/o breast cancer Persistent nausea, post prandial pain and weight loss UTI being treated with macrobid STD testing pending from yesterday's ER visit  Plan: Pt is not really sure if she wants to do anything at this time.  Will plan repeat PUS in 3 months to ensure fibroid is not growing. She is being treated for a UTI.  Results will be followed to ensure she was treated with correct antibiotic. Pap and HR HPV obtained today Rx for Diflucan 150mg  po x 1, repeat in 72 hours.  #2/0RF Referral to GI will be made   ~45 minutes spent with patient >50% of time was in face to face discussion of above.

## 2018-10-19 NOTE — Telephone Encounter (Signed)
Patient is asking to come in to see Dr.Miller. She states she was seen in the ER for a fibroid and was told to follow up with Dr.Miller.

## 2018-10-20 ENCOUNTER — Other Ambulatory Visit: Payer: Self-pay | Admitting: Obstetrics & Gynecology

## 2018-10-20 ENCOUNTER — Telehealth: Payer: Self-pay | Admitting: Obstetrics & Gynecology

## 2018-10-20 LAB — URINE CULTURE: Culture: >=100000 — AB

## 2018-10-20 MED FILL — POTASSIUM CL ER 10 MEQ CAP: 10 | 15 days supply | Qty: 30 | Fill #1

## 2018-10-20 NOTE — Telephone Encounter (Signed)
See telephone encounter dated 10/19/2018. Encounter closed.

## 2018-10-20 NOTE — Telephone Encounter (Signed)
Patient returning call to Kaitlyn. °

## 2018-10-20 NOTE — Telephone Encounter (Signed)
Left message to call Norman Bier at [phone]. 

## 2018-10-20 NOTE — Telephone Encounter (Signed)
Patient is returning a call to Kaitlyn. °

## 2018-10-20 NOTE — Telephone Encounter (Signed)
Spoke with patient. PUS scheduled for 01/18/2019 at 1 pm with 1:30 pm consult with Dr.Miller. Patient is agreeable to date and time.  Routing to provider and will close encounter.

## 2018-10-20 NOTE — Telephone Encounter (Signed)
Left message to call Kaitlyn at [phone]. 

## 2018-10-21 ENCOUNTER — Encounter: Payer: Self-pay | Admitting: Obstetrics & Gynecology

## 2018-10-21 ENCOUNTER — Telehealth: Payer: Self-pay | Admitting: Emergency Medicine

## 2018-10-21 NOTE — Telephone Encounter (Signed)
Post ED Visit - Positive Culture Follow-up  Culture report reviewed by antimicrobial stewardship pharmacist: [place] Team []  Elenor Quinones, Pharm.D. []  Heide Guile, Pharm.D., BCPS AQ-ID []  Parks Neptune, Pharm.D., BCPS []  Alycia Rossetti, Pharm.D., BCPS []  [place], [place].D., BCPS, AAHIVP []  Legrand Como, Pharm.D., BCPS, AAHIVP []  Salome Arnt, PharmD, BCPS []  Johnnette Gourd, PharmD, BCPS []  Hughes Better, PharmD, BCPS []  Leeroy Cha, PharmD []  Laqueta Linden, PharmD, BCPS []  Albertina Parr, PharmD  [place] Team []  Leodis Sias, PharmD []  Lindell Spar, PharmD []  Royetta Asal, PharmD []  Graylin Shiver, Rph []  Rema Fendt) Glennon Mac, PharmD []  Arlyn Dunning, PharmD []  Netta Cedars, PharmD [x]  Dia Sitter, PharmD []  Leone Haven, PharmD []  Gretta Arab, PharmD []  Theodis Shove, PharmD []  Peggyann Juba, PharmD []  Reuel Boom, PharmD   Positive urine culture Treated with Nitrofurantoin, organism sensitive to the same and no further patient follow-up is required at this time.  [place] Lacrisha Bielicki 10/21/2018, 12:02 PM

## 2018-10-23 MED FILL — TOPIRAMATE 50 MG TABLET: 50 | 30 days supply | Qty: 90 | Fill #1

## 2018-10-24 LAB — CYTOLOGY - PAP
Adequacy: ABSENT — AB
HPV: DETECTED — AB

## 2018-10-26 ENCOUNTER — Telehealth: Payer: Self-pay | Admitting: *Deleted

## 2018-10-26 DIAGNOSIS — S82852D Displaced trimalleolar fracture of left lower leg, subsequent encounter for closed fracture with routine healing: Secondary | ICD-10-CM | POA: Diagnosis not present

## 2018-10-26 DIAGNOSIS — M25672 Stiffness of left ankle, not elsewhere classified: Secondary | ICD-10-CM | POA: Diagnosis not present

## 2018-10-26 DIAGNOSIS — R87619 Unspecified abnormal cytological findings in specimens from cervix uteri: Secondary | ICD-10-CM

## 2018-10-26 DIAGNOSIS — B977 Papillomavirus as the cause of diseases classified elsewhere: Secondary | ICD-10-CM

## 2018-10-26 DIAGNOSIS — R262 Difficulty in walking, not elsewhere classified: Secondary | ICD-10-CM | POA: Diagnosis not present

## 2018-10-26 NOTE — Telephone Encounter (Signed)
Routing to Dr. Sabra Heck to review 10/19/18 pap results.

## 2018-10-26 NOTE — Telephone Encounter (Signed)
Patient calling for test results. °

## 2018-10-27 NOTE — Progress Notes (Signed)
TELEHEALTH VISIT  Referring Provider: Megan Salon, MD Primary Care Physician:  [name], MD   Tele-visit due to COVID-19 pandemic Patient requested visit virtually, consented to the virtual encounter via video enabled telemedicine application Contact made at: 14:00 11/01/18 Patient verified by name and date of birth Location of patient: Home Location provider: Home Names of persons participating: Me, patient, Tinnie Gens CMA Time spent on telehealth visit: 30 minutes I discussed the limitations of evaluation and management by telemedicine. The patient expressed understanding and agreed to proceed.  Reason for Consultation: Bloating, epigastric pain, and weight loss   IMPRESSION:  Abdominal bloating x years    - no change with food intake, defecation, or movement    - normal liver enzymes and lipase Unintentional weight loss of 40 pounds in 2-3 months    - albumin 4.3 10/18/18    - normal CBC 10/18/18 Recent hypokalemia of ? Etiology BMI of 33 4.5 cm uterine fibroid seen on transvaginal ultrasound 10/18/18    - Dr. Sabra Heck did not think this was the cause of her symptoms  Etiology of abdominal bloating and weight loss is unclear. But, the unintentional weight loss is worrisome.  Will start the evaluation with contrasted cross-sectional imaging. If negative, proceed with fecal elastase, EGD with duodenal biopsies, and colonoscopy.  Trial of IBGuard in the meantime.   PLAN: BMP CT of the abd/pelvis with contrast  IBGuard daily  Dietary recommendations reviewed Follow-up after the CT scan  EGD with duodenal biopsies and colonoscopy if CT is non-diagnostic    HPI: Karen Hopkins is a 43 y.o. disabled woman referred by Dr. Sabra Heck for bloating and weight loss.  The history is obtained through the patient and review of her electronic health record. Disabled for her history of locally advanced right breast cancer.   Seen for abdominal bloating and weight loss.  Upper abdominal pain with applying pressure to the abdominal well. Associated bloating and nausea developed years ago. Associated constant distension. Worsened by eating but she can't clarify the relationship to food.    Chronic constipation with one BM every few days to weeks. No change in symptoms with defecation or movement.  She is concerned that she may have fluid in her abdomen. Previously on diuretics and Coreg for a heart problems, but, that was only needed temporarily as a precaution after an echocardiogram.   She has lost about 40 pounds over the last 2-3 months. Has an appetite but when she eat's she decides that she doesn't want it.      Has not known what to do and has not tried any medications or dietary changes. She drinks carbonated beverages. No caffeine. No sugar substitutes. No dairy of gluten sensitivity.   There has been no recent abdominal imaging.  She was evaluated in the ED 10/18/18. A transabdominal and transvaginal ultrasound of the pelvis was performed 10/18/2018 to evaluate lower abdominal pain and right adnexal tenderness this revealed a 4.5 cm uterine fibroid was otherwise normal. Labs from 10/18/2018 show a normal comprehensive metabolic panel except for a potassium of 2.9, chloride 16, bicarb 19.  In particular her liver enzymes were normal with an AST 12, ALT 11, alk phos 73, total bilirubin 0.6.  Lipase was normal at 37.  Normal CBC with a white count of 7.2, hemoglobin 13.6, platelets 252.  Mother with a history of GI problems, but the details are unknown. Father with prostate. No known family history of colon cancer or polyps. No  family history of uterine/endometrial cancer, pancreatic cancer or gastric/stomach cancer.  Past Medical History:  Diagnosis Date  . Anxiety   . Cancer ([employer]) 2011   Rt. Br. Ca  . Depression   . Essential hypertension 11/05/2015  . History of breast cancer 2011   right  . History of chemotherapy 2011  . History of radiation therapy 2011   . Hypertension    under control with med., has been on med. x 2 yr.  . Lymphedema of arm    right; no BP or puncture to right arm  . Personal history of chemotherapy 09/16/2009   rt breast  . Personal history of radiation therapy 05/2010   rt breast  . Pneumonia   . Seasonal allergies   . Sinus headache     Past Surgical History:  Procedure Laterality Date  . BREAST BIOPSY Right 08/26/2009  . BREAST REDUCTION SURGERY Left 08/23/2013   Procedure: LEFT BREAST REDUCTION  ;  Surgeon: Theodoro Kos, DO;  Location: [place];  Service: Plastics;  Laterality: Left;  . CESAREAN SECTION  07/15/2001; 03/18/2004; 11/21/2008  . lateral orbiotomy Left 11/2015   Dr[facility]key at [facility]  . LATISSIMUS FLAP TO BREAST Right 03/12/2013   Procedure: RIGHT BREAST LATISSIMUS FLAP WITH EXPANDER PLACEMENT;  Surgeon: Theodoro Kos, DO;  Location: [place];  Service: Plastics;  Laterality: Right;  . LIPOSUCTION Bilateral 08/23/2013   Procedure: LIPOSUCTION;  Surgeon: Theodoro Kos, DO;  Location: [place];  Service: Plastics;  Laterality: Bilateral;  . MASTECTOMY Right 2011  . MODIFIED RADICAL MASTECTOMY Right 03/11/2010  . NASAL SEPTUM SURGERY    . ORIF ANKLE FRACTURE Left 07/18/2018   Procedure: OPEN REDUCTION INTERNAL FIXATION (ORIF) LEFT ANKLE FRACTURE WITH  SYNDESMOSIS AND ANKLE ARTHROTOMY;  Surgeon: Erle Crocker, MD;  Location: [place];  Service: Orthopedics;  Laterality: Left;  . PORT-A-CATH REMOVAL Left 12/01/2010  . PORTACATH PLACEMENT Left 09/12/2009  . REDUCTION MAMMAPLASTY Left 08/2013  . REMOVAL OF TISSUE EXPANDER AND PLACEMENT OF IMPLANT Right 08/23/2013   Procedure: REMOVAL RIGHT TISSUE EXPANDER AND PLACEMENT OF IMPLANT TO RIGHT BREAST ;  Surgeon: Theodoro Kos, DO;  Location: [place];  Service: Plastics;  Laterality: Right;  . SUPRA-UMBILICAL HERNIA  4174  . TUBAL LIGATION  11/21/2008    Current Outpatient Medications  Medication  Sig Dispense Refill  . albuterol (PROAIR HFA) 108 (90 Base) MCG/ACT inhaler Inhale 2 puffs into the lungs every 6 (six) hours as needed for wheezing or shortness of breath. 18 g 3  . diclofenac sodium (VOLTAREN) 1 % GEL Apply 4 g topically 4 (four) times daily. Use as directed 3 Tube 4  . fluconazole (DIFLUCAN) 150 MG tablet 1 tablet po x 1, repeat in 72 hours if symptoms have not resolved 2 tablet 0  . nitrofurantoin, macrocrystal-monohydrate, (MACROBID) 100 MG capsule Take 1 capsule (100 mg total) by mouth 2 (two) times daily. 10 capsule 0  . ondansetron (ZOFRAN ODT) 4 MG disintegrating tablet Take 1 tablet (4 mg total) by mouth every 8 (eight) hours as needed for nausea or vomiting. 20 tablet 0  . polyethylene glycol powder (GLYCOLAX) powder Take 17 g by mouth daily. (Patient not taking: Reported on 10/18/2018) 500 g 5  . potassium chloride (MICRO-K) 10 MEQ CR capsule Take 1 capsule (10 mEq total) by mouth 2 (two) times daily. 30 capsule 3  . tiZANidine (ZANAFLEX) 4 MG tablet TAKE ONE (1) TABLET BY MOUTH 3 TIMES DAILY (  Patient taking differently: Take 4 mg by mouth every 8 (eight) hours as needed for muscle spasms. ) 90 tablet 3  . topiramate (TOPAMAX) 50 MG tablet Take 3 tablets (150 mg total) by mouth at bedtime. 90 tablet 3   No current facility-administered medications for this visit.     Allergies as of 11/01/2018 - Review Complete 10/19/2018  Allergen Reaction Noted  . Penicillins Swelling 04/14/2010  . Lyrica [pregabalin] Nausea Only 01/19/2012  . Meloxicam Nausea Only 12/22/2011  . Robaxin [methocarbamol] Nausea Only 12/22/2011    Family History  Problem Relation Age of Onset  . Hypertension Mother   . Breast cancer Mother 55  . Diabetes type II Father   . Prostate cancer Father   . Stroke Neg Hx     Social History   Socioeconomic History  . Marital status: Single    Spouse name: Not on file  . Number of children: 3  . Years of education: 23  . Highest education level:  Not on file  Occupational History  . Occupation: Disability   Social Needs  . Financial resource strain: Not on file  . Food insecurity:    Worry: Not on file    Inability: Not on file  . Transportation needs:    Medical: Not on file    Non-medical: Not on file  Tobacco Use  . Smoking status: Former Smoker    Packs/day: 0.25    Years: 5.00    Pack years: 1.25    Types: Cigarettes    Start date: 06/07/2002    Last attempt to quit: 04/26/2010    Years since quitting: 8.5  . Smokeless tobacco: Never Used  Substance and Sexual Activity  . Alcohol use: No  . Drug use: No  . Sexual activity: Not Currently    Birth control/protection: Surgical  Lifestyle  . Physical activity:    Days per week: Not on file    Minutes per session: Not on file  . Stress: Not on file  Relationships  . Social connections:    Talks on phone: Not on file    Gets together: Not on file    Attends religious service: Not on file    Active member of club or organization: Not on file    Attends meetings of clubs or organizations: Not on file    Relationship status: Not on file  . Intimate partner violence:    Fear of current or ex partner: Not on file    Emotionally abused: Not on file    Physically abused: Not on file    Forced sexual activity: Not on file  Other Topics Concern  . Not on file  Social History Narrative   Lives with kids   Caffeine use: none     Review of Systems: ALL ROS discussed and all others negative except listed in HPI.  Physical Exam: General: in no acute distress Neuro: Alert and appropriate Psych: Normal affect and normal insight   Simran Mannis L. Tarri Glenn, MD, MPH [facility] 10/27/2018, 3:54 PM

## 2018-10-31 ENCOUNTER — Encounter: Payer: Self-pay | Admitting: General Surgery

## 2018-10-31 NOTE — Telephone Encounter (Signed)
Please let her know her pap was abnormal and she needs a colpo, ECC and endometrial biopsy for additional evaluation.  Thanks.

## 2018-10-31 NOTE — Telephone Encounter (Signed)
Spoke with patient, advised of results as seen below per Dr. Sabra Heck. Brief explanation of colpo and EMB provided, questions answered. Patient is postmenopausal and hx of BTL. Colpo and EMB scheduled for 5/28 at 2:30pm with Dr. Sabra Heck. Order placed for precert. Advised to take Motrin 800 mg with food and water one hour before procedure. Patient verbalizes understanding and is agreeable.    Routing to [employer] for [employer].   Encounter closed.

## 2018-11-01 ENCOUNTER — Other Ambulatory Visit: Payer: Self-pay

## 2018-11-01 ENCOUNTER — Ambulatory Visit (INDEPENDENT_AMBULATORY_CARE_PROVIDER_SITE_OTHER): Payer: Medicare Other | Admitting: Gastroenterology

## 2018-11-01 ENCOUNTER — Encounter: Payer: Self-pay | Admitting: Gastroenterology

## 2018-11-01 VITALS — Ht 64.0 in | Wt 195.0 lb

## 2018-11-01 DIAGNOSIS — R140 Abdominal distension (gaseous): Secondary | ICD-10-CM

## 2018-11-01 DIAGNOSIS — R634 Abnormal weight loss: Secondary | ICD-10-CM

## 2018-11-01 DIAGNOSIS — E876 Hypokalemia: Secondary | ICD-10-CM | POA: Diagnosis not present

## 2018-11-01 DIAGNOSIS — D259 Leiomyoma of uterus, unspecified: Secondary | ICD-10-CM

## 2018-11-01 NOTE — Patient Instructions (Addendum)
I am recommending a CT of the abdomen and pelvis for further evaluation of your bloating and unexplained weight loss.  Please come by the office any time between 7:30 am and 4 pm to have blood work done. The lab is located on the basement level of our building. You do not have to fast and you do not need an appointment.   I recommend a trial of IB Guard in the meantime. You may stop by the office at any time to pick up some samples.  Please keep a food diary to help identify specific triggers.  Avoid carbonated and caffeinated foods. Dairy may also be causing some bloating.   We will plan a follow-up visit after the CT scan to review the results.

## 2018-11-02 ENCOUNTER — Other Ambulatory Visit: Payer: Self-pay

## 2018-11-02 ENCOUNTER — Encounter: Payer: Self-pay | Admitting: Obstetrics & Gynecology

## 2018-11-02 ENCOUNTER — Ambulatory Visit (INDEPENDENT_AMBULATORY_CARE_PROVIDER_SITE_OTHER): Payer: Medicare Other | Admitting: Obstetrics & Gynecology

## 2018-11-02 VITALS — BP 122/90 | HR 96 | Temp 97.2°F | Ht 64.0 in | Wt 192.0 lb

## 2018-11-02 DIAGNOSIS — B977 Papillomavirus as the cause of diseases classified elsewhere: Secondary | ICD-10-CM

## 2018-11-02 DIAGNOSIS — N926 Irregular menstruation, unspecified: Secondary | ICD-10-CM | POA: Diagnosis not present

## 2018-11-02 DIAGNOSIS — R87619 Unspecified abnormal cytological findings in specimens from cervix uteri: Secondary | ICD-10-CM | POA: Diagnosis not present

## 2018-11-02 DIAGNOSIS — N871 Moderate cervical dysplasia: Secondary | ICD-10-CM | POA: Diagnosis not present

## 2018-11-02 LAB — POCT URINE PREGNANCY: Preg Test, Ur: NEGATIVE

## 2018-11-02 NOTE — Progress Notes (Signed)
43 y.o. Single AA female here for colposcopy with possible biopsies and/or ECC due to AGUS Pap with +HR HPV obtained 10/19/2018.    Patient's last menstrual period was 09/25/2018 (exact date).          Sexually active: Yes.    The current method of family planning is tubal ligation.     Patient has been counseled about results and procedure.  Risks and benefits have bene reviewed including immediate and/or delayed bleeding, infection, cervical scaring from procedure, possibility of needing additional follow up as well as treatment.  rare risks of missing a lesion discussed as well.  All questions answered.  Pt ready to proceed.  BP 122/90   Pulse 96   Temp (!) 97.2 F (36.2 C) (Temporal)   Ht 5\' 4"  (1.626 m)   Wt 192 lb (87.1 kg)   LMP 09/25/2018 (Exact Date)   BMI 32.96 kg/m   Physical Exam  Constitutional: She is oriented to [place], place, and time. She appears well-developed and well-nourished.  GI: Soft. Bowel sounds are normal.  Genitourinary:    Vagina and uterus normal.      Lymphadenopathy:       Right: No inguinal adenopathy present.       Left: No inguinal adenopathy present.  Neurological: She is alert and oriented to [place], place, and time.  Skin: Skin is warm and dry.  Psychiatric: She has a normal mood and affect.    Speculum placed.  3% acetic acid applied to cervix for >45 seconds.  Cervix visualized with both 7.5X and 15X magnification.  Green filter also used.  Lugols solution was not used.  Findings:  AWE at 4 o'clock in two separate locations as noted in picture.  Biopsy:  4 o'clock obtained.  ECC:  was performed.  Betadine was then used to cleanse cervix x 3.  Anterior lip of cervix was grasped with single toothed tenaculum.  Endometrial pipelle then passed into endometrial cavity.  This passed to 8cm.  Biopsy was obtained with two passes.  Pipelle removed.  Tenaculum removed.  Silver nitrate used on tenaculum sites.  Monsel's was needed as well for hemostasis  at biopsy site.  Excellent hemostasis was present.  Pt tolerated procedure well and all instruments were removed.  Findings noted above on picture of cervix.  Assessment:  AGUS pap with +HR HPV H/O irregular bleeding (pt was on cycle day of pap smear)  Plan:  Endometrial biopsy, ECC, and cervical biopsy pending.  Pathology results will be called to patient and follow-up planned pending results.

## 2018-11-02 NOTE — Patient Instructions (Signed)
Colposcopy Post Procedure Instructions  * You may take Ibuprofen, Aleve, or Tylenol for cramping as needed.  * If Monsel's solution is used, you may have a slight black discharge for a day or two.   * Light bleeding is normal. If bleeding is heavier than your period, please call our office.  * Refrain from putting anything in your vagina until the bleeding or discharge stops (usually 2 or 3 days).  * You will be notified within one week of your biopsy results, or we will discuss your results at your follow-up appointment if needed.  

## 2018-11-04 MED FILL — POTASSIUM CL ER 10 MEQ CAP: 10 | 15 days supply | Qty: 30 | Fill #2

## 2018-11-07 ENCOUNTER — Telehealth: Payer: Self-pay | Admitting: *Deleted

## 2018-11-07 DIAGNOSIS — N871 Moderate cervical dysplasia: Secondary | ICD-10-CM

## 2018-11-07 NOTE — Telephone Encounter (Signed)
Notes recorded by Burnice Logan, RN on 11/07/2018 at 1:31 PM EDT Spoke with patient, advised as seen below per Dr. Sabra Heck. Brief explanation of LEEP procedure provided, questions answered. BTL for contraceptive. Hx of irregular bleeding, LMP approximately 10/21/18. LEEP procedure scheduled for 11/16/18 at 4pm with Dr. Sabra Heck. Advised to take Motrin 800 mg with food and water one hour before procedure. Patient verbalizes understanding and is agreeable.   Patient states she has decided she would like to proceed with removal of fibroid, discussed at 10/19/18 OV. Advised I will forward to Dr. Sabra Heck for review, our office will return call with recommendations. See telephone encounter dated 11/07/18. ---  Order placed for LEEP.   Routing to Dr. Sabra Heck to advise on removal of fibroid.  Cc: Lerry Liner

## 2018-11-07 NOTE — Telephone Encounter (Signed)
-----   Message from Megan Salon, MD sent at 11/07/2018 10:12 AM EDT ----- Please let pt know her biopsy showed high grade dysplasia.  This is not cancer but needs treatment with LEEP.  ECC and endometrial biopsy were both negative.

## 2018-11-09 NOTE — Telephone Encounter (Signed)
Patient notified. Agreeable to plan.   Encounter closed.

## 2018-11-09 NOTE — Telephone Encounter (Signed)
We will talk when she comes for the LEEP procedure.  Ok to close encounter.

## 2018-11-14 ENCOUNTER — Other Ambulatory Visit: Payer: Self-pay

## 2018-11-15 ENCOUNTER — Other Ambulatory Visit: Payer: Self-pay

## 2018-11-15 ENCOUNTER — Ambulatory Visit (INDEPENDENT_AMBULATORY_CARE_PROVIDER_SITE_OTHER): Payer: Medicare Other | Admitting: Obstetrics and Gynecology

## 2018-11-15 ENCOUNTER — Encounter: Payer: Self-pay | Admitting: Obstetrics and Gynecology

## 2018-11-15 ENCOUNTER — Encounter (HOSPITAL_COMMUNITY): Payer: Self-pay

## 2018-11-15 ENCOUNTER — Telehealth: Payer: Self-pay | Admitting: *Deleted

## 2018-11-15 ENCOUNTER — Encounter: Payer: Self-pay | Admitting: Obstetrics & Gynecology

## 2018-11-15 ENCOUNTER — Emergency Department (HOSPITAL_COMMUNITY): Payer: Medicare Other

## 2018-11-15 ENCOUNTER — Other Ambulatory Visit (HOSPITAL_COMMUNITY)
Admission: RE | Admit: 2018-11-15 | Discharge: 2018-11-15 | Disposition: A | Discharge status: Home / Self Care | Payer: Medicare Other | Source: Ambulatory Visit | Attending: Obstetrics and Gynecology | Admitting: Obstetrics and Gynecology

## 2018-11-15 ENCOUNTER — Inpatient Hospital Stay (HOSPITAL_COMMUNITY)
Admission: EM | Admit: 2018-11-15 | Discharge: 2018-11-18 | DRG: 872 | Disposition: A | Discharge status: Home / Self Care | Payer: Medicare Other | Attending: Internal Medicine | Admitting: Internal Medicine

## 2018-11-15 VITALS — BP 118/88 | HR 80 | Temp 98.4°F | Resp 14 | Wt 188.0 lb

## 2018-11-15 DIAGNOSIS — R531 Weakness: Secondary | ICD-10-CM | POA: Diagnosis not present

## 2018-11-15 DIAGNOSIS — E119 Type 2 diabetes mellitus without complications: Secondary | ICD-10-CM | POA: Diagnosis not present

## 2018-11-15 DIAGNOSIS — E876 Hypokalemia: Secondary | ICD-10-CM | POA: Diagnosis present

## 2018-11-15 DIAGNOSIS — Z853 Personal history of malignant neoplasm of breast: Secondary | ICD-10-CM

## 2018-11-15 DIAGNOSIS — K429 Umbilical hernia without obstruction or gangrene: Secondary | ICD-10-CM | POA: Diagnosis not present

## 2018-11-15 DIAGNOSIS — Z923 Personal history of irradiation: Secondary | ICD-10-CM

## 2018-11-15 DIAGNOSIS — N10 Acute tubulo-interstitial nephritis: Secondary | ICD-10-CM | POA: Diagnosis not present

## 2018-11-15 DIAGNOSIS — R8290 Unspecified abnormal findings in urine: Secondary | ICD-10-CM | POA: Diagnosis not present

## 2018-11-15 DIAGNOSIS — D259 Leiomyoma of uterus, unspecified: Secondary | ICD-10-CM | POA: Diagnosis not present

## 2018-11-15 DIAGNOSIS — R197 Diarrhea, unspecified: Secondary | ICD-10-CM | POA: Diagnosis not present

## 2018-11-15 DIAGNOSIS — R509 Fever, unspecified: Secondary | ICD-10-CM | POA: Diagnosis not present

## 2018-11-15 DIAGNOSIS — Z87891 Personal history of nicotine dependence: Secondary | ICD-10-CM | POA: Diagnosis not present

## 2018-11-15 DIAGNOSIS — Z888 Allergy status to other drugs, medicaments and biological substances status: Secondary | ICD-10-CM | POA: Diagnosis not present

## 2018-11-15 DIAGNOSIS — R109 Unspecified abdominal pain: Secondary | ICD-10-CM

## 2018-11-15 DIAGNOSIS — R110 Nausea: Secondary | ICD-10-CM | POA: Diagnosis not present

## 2018-11-15 DIAGNOSIS — A419 Sepsis, unspecified organism: Principal | ICD-10-CM | POA: Diagnosis present

## 2018-11-15 DIAGNOSIS — I10 Essential (primary) hypertension: Secondary | ICD-10-CM | POA: Diagnosis present

## 2018-11-15 DIAGNOSIS — Z9221 Personal history of antineoplastic chemotherapy: Secondary | ICD-10-CM

## 2018-11-15 DIAGNOSIS — R1031 Right lower quadrant pain: Secondary | ICD-10-CM | POA: Diagnosis not present

## 2018-11-15 DIAGNOSIS — E669 Obesity, unspecified: Secondary | ICD-10-CM | POA: Diagnosis present

## 2018-11-15 DIAGNOSIS — Z803 Family history of malignant neoplasm of breast: Secondary | ICD-10-CM

## 2018-11-15 DIAGNOSIS — Z8249 Family history of ischemic heart disease and other diseases of the circulatory system: Secondary | ICD-10-CM

## 2018-11-15 DIAGNOSIS — N200 Calculus of kidney: Secondary | ICD-10-CM | POA: Diagnosis not present

## 2018-11-15 DIAGNOSIS — Z20828 Contact with and (suspected) exposure to other viral communicable diseases: Secondary | ICD-10-CM | POA: Diagnosis present

## 2018-11-15 DIAGNOSIS — Z833 Family history of diabetes mellitus: Secondary | ICD-10-CM | POA: Diagnosis not present

## 2018-11-15 DIAGNOSIS — R1030 Lower abdominal pain, unspecified: Secondary | ICD-10-CM | POA: Diagnosis not present

## 2018-11-15 DIAGNOSIS — N760 Acute vaginitis: Secondary | ICD-10-CM | POA: Diagnosis not present

## 2018-11-15 DIAGNOSIS — Z880 Allergy status to penicillin: Secondary | ICD-10-CM | POA: Diagnosis not present

## 2018-11-15 DIAGNOSIS — N390 Urinary tract infection, site not specified: Secondary | ICD-10-CM | POA: Diagnosis present

## 2018-11-15 DIAGNOSIS — Z8042 Family history of malignant neoplasm of prostate: Secondary | ICD-10-CM

## 2018-11-15 DIAGNOSIS — R1084 Generalized abdominal pain: Secondary | ICD-10-CM | POA: Diagnosis not present

## 2018-11-15 DIAGNOSIS — Z6832 Body mass index (BMI) 32.0-32.9, adult: Secondary | ICD-10-CM

## 2018-11-15 LAB — URINALYSIS, ROUTINE W REFLEX MICROSCOPIC
Bilirubin Urine: NEGATIVE
Glucose, UA: NEGATIVE mg/dL
Ketones, ur: 20 mg/dL — AB
Nitrite: POSITIVE — AB
Protein, ur: NEGATIVE mg/dL
Specific Gravity, Urine: 1.012 (ref 1.005–1.030)
WBC, UA: >50 WBC/hpf — ABNORMAL HIGH (ref 0–5)
pH: 6 (ref 5.0–8.0)

## 2018-11-15 LAB — CBC WITH DIFFERENTIAL/PLATELET
Abs Immature Granulocytes: 0.05 10*3/uL (ref 0.00–0.07)
Basophils Absolute: 0 10*3/uL (ref 0.0–0.1)
Basophils Relative: 0 %
Eosinophils Absolute: 0 10*3/uL (ref 0.0–0.5)
Eosinophils Relative: 0 %
HCT: 39 % (ref 36.0–46.0)
Hemoglobin: 12.7 g/dL (ref 12.0–15.0)
Immature Granulocytes: 1 %
Lymphocytes Relative: 9 %
Lymphs Abs: 1 10*3/uL (ref 0.7–4.0)
MCH: 30.4 pg (ref 26.0–34.0)
MCHC: 32.6 g/dL (ref 30.0–36.0)
MCV: 93.3 fL (ref 80.0–100.0)
Monocytes Absolute: 0.7 10*3/uL (ref 0.1–1.0)
Monocytes Relative: 6 %
Neutro Abs: 9.1 10*3/uL — ABNORMAL HIGH (ref 1.7–7.7)
Neutrophils Relative %: 84 %
Platelets: 214 10*3/uL (ref 150–400)
RBC: 4.18 MIL/uL (ref 3.87–5.11)
RDW: 13.9 % (ref 11.5–15.5)
WBC: 10.9 10*3/uL — ABNORMAL HIGH (ref 4.0–10.5)
nRBC: 0 % (ref 0.0–0.2)

## 2018-11-15 LAB — LACTIC ACID, PLASMA: Lactic Acid, Venous: 1.5 mmol/L (ref 0.5–1.9)

## 2018-11-15 LAB — POCT URINALYSIS DIPSTICK
Bilirubin, UA: NEGATIVE
Glucose, UA: NEGATIVE
Nitrite, UA: POSITIVE
Protein, UA: POSITIVE — AB
Urobilinogen, UA: NEGATIVE E.U./dL — AB
pH, UA: 5 (ref 5.0–8.0)

## 2018-11-15 LAB — COMPREHENSIVE METABOLIC PANEL
ALT: 10 U/L (ref 0–44)
AST: 12 U/L — ABNORMAL LOW (ref 15–41)
Albumin: 4 g/dL (ref 3.5–5.0)
Alkaline Phosphatase: 68 U/L (ref 38–126)
Anion gap: 5 (ref 5–15)
BUN: 5 mg/dL — ABNORMAL LOW (ref 6–20)
CO2: 18 mmol/L — ABNORMAL LOW (ref 22–32)
Calcium: 8.5 mg/dL — ABNORMAL LOW (ref 8.9–10.3)
Chloride: 111 mmol/L (ref 98–111)
Creatinine, Ser: 0.63 mg/dL (ref 0.44–1.00)
GFR calc Af Amer: >60 mL/min (ref >60)
GFR calc non Af Amer: >60 mL/min (ref >60)
Glucose, Bld: 92 mg/dL (ref 70–99)
Potassium: 2.9 mmol/L — ABNORMAL LOW (ref 3.5–5.1)
Sodium: 134 mmol/L — ABNORMAL LOW (ref 135–145)
Total Bilirubin: 0.8 mg/dL (ref 0.3–1.2)
Total Protein: 7.4 g/dL (ref 6.5–8.1)

## 2018-11-15 LAB — HCG, QUANTITATIVE, PREGNANCY: hCG, Beta Chain, Quant, S: <1 m[IU]/mL (ref <5)

## 2018-11-15 LAB — SARS CORONAVIRUS 2 BY RT PCR (HOSPITAL ORDER, PERFORMED IN ~~LOC~~ HOSPITAL LAB): SARS Coronavirus 2: NEGATIVE

## 2018-11-15 LAB — LIPASE, BLOOD: Lipase: 21 U/L (ref 11–51)

## 2018-11-15 LAB — POCT URINE PREGNANCY: Preg Test, Ur: NEGATIVE

## 2018-11-15 MED ORDER — POTASSIUM CHLORIDE 10 MEQ/100ML IV SOLN
10.0000 meq | INTRAVENOUS | Status: AC
  Administered 2018-11-15 (×3): 10 meq via INTRAVENOUS
  Filled 2018-11-15 (×2): qty 100

## 2018-11-15 MED ORDER — SODIUM CHLORIDE (PF) 0.9 % IJ SOLN
INTRAMUSCULAR | Status: AC
  Filled 2018-11-15: qty 50

## 2018-11-15 MED ORDER — ONDANSETRON HCL 4 MG PO TABS: 4.0000 mg | ORAL_TABLET | Freq: Four times a day (QID) | ORAL | Status: DC | PRN

## 2018-11-15 MED ORDER — MORPHINE SULFATE (PF) 4 MG/ML IV SOLN
4.0000 mg | Freq: Once | INTRAVENOUS | Status: AC
  Administered 2018-11-15: 4 mg via INTRAVENOUS

## 2018-11-15 MED ORDER — IBUPROFEN 200 MG PO TABS
600.0000 mg | ORAL_TABLET | Freq: Once | ORAL | Status: AC
  Administered 2018-11-15: 600 mg via ORAL
  Filled 2018-11-15: qty 3

## 2018-11-15 MED ORDER — SODIUM CHLORIDE 0.9 % IV SOLN: 1.0000 g | Freq: Once | INTRAVENOUS | Status: DC

## 2018-11-15 MED ORDER — SODIUM CHLORIDE 0.9 % IV BOLUS
1000.0000 mL | Freq: Once | INTRAVENOUS | Status: AC
  Administered 2018-11-15: 1000 mL via INTRAVENOUS

## 2018-11-15 MED ORDER — CIPROFLOXACIN IN D5W 400 MG/200ML IV SOLN
400.0000 mg | Freq: Once | INTRAVENOUS | Status: AC
  Administered 2018-11-15: 400 mg via INTRAVENOUS
  Filled 2018-11-15: qty 200

## 2018-11-15 MED ORDER — TIZANIDINE HCL 4 MG PO TABS: 4.0000 mg | ORAL_TABLET | Freq: Three times a day (TID) | ORAL | Status: DC | PRN

## 2018-11-15 MED ORDER — MORPHINE SULFATE (PF) 4 MG/ML IV SOLN
4.0000 mg | Freq: Once | INTRAVENOUS | Status: AC
  Administered 2018-11-15: 4 mg via INTRAVENOUS
  Filled 2018-11-15: qty 1

## 2018-11-15 MED ORDER — METRONIDAZOLE IN NACL 5-0.79 MG/ML-% IV SOLN
500.0000 mg | Freq: Once | INTRAVENOUS | Status: AC
  Administered 2018-11-15: 500 mg via INTRAVENOUS
  Filled 2018-11-15: qty 100

## 2018-11-15 MED ORDER — TOPIRAMATE 25 MG PO TABS
150.0000 mg | ORAL_TABLET | Freq: Every day | ORAL | Status: DC
  Administered 2018-11-15 – 2018-11-17 (×3): 150 mg via ORAL
  Filled 2018-11-15 (×3): qty 2

## 2018-11-15 MED ORDER — POTASSIUM CHLORIDE 10 MEQ/100ML IV SOLN
INTRAVENOUS | Status: AC
  Administered 2018-11-15: 10 meq via INTRAVENOUS
  Filled 2018-11-15: qty 100

## 2018-11-15 MED ORDER — ACETAMINOPHEN 325 MG PO TABS
650.0000 mg | ORAL_TABLET | Freq: Once | ORAL | Status: AC
  Administered 2018-11-15: 650 mg via ORAL
  Filled 2018-11-15: qty 2

## 2018-11-15 MED ORDER — SODIUM CHLORIDE 0.9 % IV SOLN
INTRAVENOUS | Status: DC
  Administered 2018-11-15 – 2018-11-18 (×6): via INTRAVENOUS

## 2018-11-15 MED ORDER — ONDANSETRON HCL 4 MG/2ML IJ SOLN
4.0000 mg | Freq: Four times a day (QID) | INTRAMUSCULAR | Status: DC | PRN
  Administered 2018-11-15: 4 mg via INTRAVENOUS

## 2018-11-15 MED ORDER — IOHEXOL 300 MG/ML  SOLN
100.0000 mL | Freq: Once | INTRAMUSCULAR | Status: AC | PRN
  Administered 2018-11-15: 100 mL via INTRAVENOUS

## 2018-11-15 MED ORDER — HYDROCODONE-ACETAMINOPHEN 5-325 MG PO TABS
1.0000 | ORAL_TABLET | ORAL | Status: DC | PRN
  Administered 2018-11-16 – 2018-11-18 (×11): 2 via ORAL
  Filled 2018-11-15 (×11): qty 2

## 2018-11-15 MED ORDER — ENOXAPARIN SODIUM 40 MG/0.4ML ~~LOC~~ SOLN
40.0000 mg | Freq: Every day | SUBCUTANEOUS | Status: DC
  Administered 2018-11-15 – 2018-11-17 (×3): 40 mg via SUBCUTANEOUS
  Filled 2018-11-15 (×3): qty 0.4

## 2018-11-15 MED ORDER — CIPROFLOXACIN IN D5W 400 MG/200ML IV SOLN: 400.0000 mg | Freq: Two times a day (BID) | INTRAVENOUS | Status: DC

## 2018-11-15 MED ORDER — MORPHINE SULFATE (PF) 4 MG/ML IV SOLN
INTRAVENOUS | Status: AC
  Administered 2018-11-15: 4 mg via INTRAVENOUS
  Filled 2018-11-15: qty 1

## 2018-11-15 MED ORDER — ACETAMINOPHEN 650 MG RE SUPP: 650.0000 mg | Freq: Four times a day (QID) | RECTAL | Status: DC | PRN

## 2018-11-15 MED ORDER — ACETAMINOPHEN 325 MG PO TABS: 650.0000 mg | ORAL_TABLET | Freq: Four times a day (QID) | ORAL | Status: DC | PRN

## 2018-11-15 MED ORDER — CYCLOBENZAPRINE HCL 10 MG PO TABS
10.0000 mg | ORAL_TABLET | Freq: Three times a day (TID) | ORAL | Status: DC | PRN
  Administered 2018-11-15 – 2018-11-18 (×7): 10 mg via ORAL
  Filled 2018-11-15 (×7): qty 1

## 2018-11-15 MED ORDER — ALBUTEROL SULFATE (2.5 MG/3ML) 0.083% IN NEBU: 3.0000 mL | INHALATION_SOLUTION | Freq: Four times a day (QID) | RESPIRATORY_TRACT | Status: DC | PRN

## 2018-11-15 MED ORDER — POLYETHYLENE GLYCOL 3350 17 G PO PACK: 17.0000 g | PACK | Freq: Every day | ORAL | Status: DC | PRN

## 2018-11-15 MED ORDER — CIPROFLOXACIN IN D5W 400 MG/200ML IV SOLN
400.0000 mg | Freq: Two times a day (BID) | INTRAVENOUS | Status: DC
  Administered 2018-11-15 – 2018-11-17 (×5): 400 mg via INTRAVENOUS
  Filled 2018-11-15 (×6): qty 200

## 2018-11-15 NOTE — Progress Notes (Signed)
PHARMACY NOTE -  Cipro  Pharmacy has been assisting with dosing of ciprofloxacin for pyelonephritis.  Dosage remains stable at 400 mg IV q12 hr and need for further dosage adjustment appears unlikely at present given stable renal function  Pharmacy will sign off, following peripherally for culture results or dose adjustments. Please reconsult if a change in clinical status warrants re-evaluation of dosage.  Reuel Boom, PharmD, BCPS [phone] 11/15/2018, 7:10 PM

## 2018-11-15 NOTE — ED Provider Notes (Signed)
[facility] DEPT Provider Note   CSN: [id number] Arrival date & time: 11/15/18  1251    History   Chief Complaint Chief Complaint  Patient presents with  . Abdominal Pain    HPI Karen Hopkins is a 43 y.o. female with history of uterine fibroids presenting today for abdominal pain and fever.  Patient was seen at her OB/GYN's office today for evaluation of her fibroid and pelvic pain.  She reports that she has had a severe intensity cramping sensation in her suprapubic area consistent with her normal menstrual cycle and fibroid pain for the past 3 days without aggravating or alleviating factors.  Additionally patient reports over the past 2 days she has developed a constant severe sharp right lower quadrant pain that is separate from her menstrual cramping.  She reports that she developed a fever last night for which she attempted take ibuprofen on empty stomach and subsequently vomited, nonbloody/nonbilious no emesis since however she has had a low appetite for the past 2 days.  Last bowel movement yesterday, watery nonbloody.     HPI  Past Medical History:  Diagnosis Date  . Anxiety   . Cancer ([place]) 2011   Rt. Br. Ca  . Depression   . Essential hypertension 11/05/2015  . History of breast cancer 2011   right  . History of chemotherapy 2011  . History of radiation therapy 2011  . Hypertension    under control with med., has been on med. x 2 yr.  . Lymphedema of arm    right; no BP or puncture to right arm  . Personal history of chemotherapy 09/16/2009   rt breast  . Personal history of radiation therapy 05/2010   rt breast  . Pneumonia   . Seasonal allergies   . Sinus headache     Patient Active Problem List   Diagnosis Date Noted  . Lymphedema of right arm 02/27/2018  . Dyspnea 09/23/2016  . Eczema 08/31/2016  . Essential hypertension 11/05/2015  . Cervical dystonia 08/25/2015  . Headache 07/09/2015  . Myofascial muscle pain  04/29/2015  . Adhesive capsulitis of right shoulder 04/29/2015  . Type 2 diabetes mellitus without complication ([place]) 32/95/1884  . Lower extremity edema 10/01/2014  . Malignant neoplasm of overlapping sites of right breast in female, estrogen receptor positive ([place]) 05/24/2014  . Myalgia and myositis 05/17/2014  . Neoplasm related pain 03/12/2014  . CN (constipation) 11/26/2013  . H/O reduction mammoplasty 10/30/2013  . Seizure ([place]) 10/06/2013  . Status post bilateral breast implants 08/31/2013  . S/P breast reconstruction, right 08/23/2013  . AC joint arthropathy 07/02/2013  . Routine adult health maintenance 05/29/2013  . Hypokalemia 05/21/2013  . Chronic pain 04/19/2013  . Elevated blood sugar 03/09/2013  . Acquired absence of breast and absent nipple 01/30/2013  . RHINOSINUSITIS, CHRONIC 04/08/2010  . Obesity 08/04/2006  . DEPRESSIVE DISORDER, NOS 08/04/2006    Past Surgical History:  Procedure Laterality Date  . BREAST BIOPSY Right 08/26/2009  . BREAST REDUCTION SURGERY Left 08/23/2013   Procedure: LEFT BREAST REDUCTION  ;  Surgeon: Theodoro Kos, DO;  Location: [facility];  Service: Plastics;  Laterality: Left;  . CESAREAN SECTION  07/15/2001; 03/18/2004; 11/21/2008  . lateral orbiotomy Left 11/2015   Dr. Toy Cookey at [facility]  . LATISSIMUS FLAP TO BREAST Right 03/12/2013   Procedure: RIGHT BREAST LATISSIMUS FLAP WITH EXPANDER PLACEMENT;  Surgeon: Theodoro Kos, DO;  Location: [facility];  Service: Plastics;  Laterality: Right;  .  LIPOSUCTION Bilateral 08/23/2013   Procedure: LIPOSUCTION;  Surgeon: Theodoro Kos, DO;  Location: [facility];  Service: Plastics;  Laterality: Bilateral;  . MASTECTOMY Right 2011  . MODIFIED RADICAL MASTECTOMY Right 03/11/2010  . NASAL SEPTUM SURGERY    . ORIF ANKLE FRACTURE Left 07/18/2018   Procedure: OPEN REDUCTION INTERNAL FIXATION (ORIF) LEFT ANKLE FRACTURE WITH  SYNDESMOSIS AND ANKLE ARTHROTOMY;  Surgeon: Erle Crocker,  MD;  Location: [facility];  Service: Orthopedics;  Laterality: Left;  . PORT-A-CATH REMOVAL Left 12/01/2010  . PORTACATH PLACEMENT Left 09/12/2009  . REDUCTION MAMMAPLASTY Left 08/2013  . REMOVAL OF TISSUE EXPANDER AND PLACEMENT OF IMPLANT Right 08/23/2013   Procedure: REMOVAL RIGHT TISSUE EXPANDER AND PLACEMENT OF IMPLANT TO RIGHT BREAST ;  Surgeon: Theodoro Kos, DO;  Location: [facility];  Service: Plastics;  Laterality: Right;  . SUPRA-UMBILICAL HERNIA  6712  . TUBAL LIGATION  11/21/2008     OB History    Gravida  3   Para  3   Term  2   Preterm  1   AB      Living  2     SAB      TAB      Ectopic      Multiple      Live Births  2            Home Medications    Prior to Admission medications   Medication Sig Start Date End Date Taking? Authorizing Provider  albuterol (PROAIR HFA) 108 (90 Base) MCG/ACT inhaler Inhale 2 puffs into the lungs every 6 (six) hours as needed for wheezing or shortness of breath. 10/12/16   [name], MD  diclofenac sodium (VOLTAREN) 1 % GEL Apply 4 g topically 4 (four) times daily. Use as directed 09/19/17   Bayard Hugger, NP  potassium chloride (MICRO-K) 10 MEQ CR capsule Take 1 capsule (10 mEq total) by mouth 2 (two) times daily. 10/02/18   Magrinat, Virgie Dad, MD  tiZANidine (ZANAFLEX) 4 MG tablet TAKE ONE (1) TABLET BY MOUTH 3 TIMES DAILY Patient taking differently: Take 4 mg by mouth every 8 (eight) hours as needed for muscle spasms.  02/27/18   Kirsteins, Luanna Salk, MD  topiramate (TOPAMAX) 50 MG tablet Take 3 tablets (150 mg total) by mouth at bedtime. 06/09/18   Venancio Poisson, NP    Family History Family History  Problem Relation Age of Onset  . Hypertension Mother   . Breast cancer Mother 20  . Diabetes type II Father   . Prostate cancer Father   . Stroke Neg Hx   . Colon polyps Neg Hx   . Esophageal cancer Neg Hx   . Pancreatic cancer Neg Hx   . Stomach cancer Neg Hx   . Rectal  cancer Neg Hx     Social History Social History   Tobacco Use  . Smoking status: Former Smoker    Packs/day: 0.25    Years: 5.00    Pack years: 1.25    Types: Cigarettes    Start date: 06/07/2002    Last attempt to quit: 04/26/2010    Years since quitting: 8.5  . Smokeless tobacco: Never Used  Substance Use Topics  . Alcohol use: No  . Drug use: No     Allergies   Penicillins; Lyrica [pregabalin]; Meloxicam; and Robaxin [methocarbamol]   Review of Systems Review of Systems  Constitutional: Positive for chills and fever.  Respiratory: Negative for  cough and shortness of breath.   Cardiovascular: Negative for chest pain.  Gastrointestinal: Positive for abdominal pain, diarrhea, nausea and vomiting. Negative for blood in stool.  Genitourinary: Positive for vaginal bleeding. Negative for dysuria, hematuria and vaginal discharge.  All other systems reviewed and are negative.    Physical Exam Updated Vital Signs BP 124/75   Pulse 88   Temp (!) 102.7 F (39.3 C) (Oral)   Resp (!) 32   LMP 10/18/2018   SpO2 100%   Physical Exam Constitutional:      General: She is not in acute distress.    Appearance: Normal appearance. She is well-developed. She is obese. She is not ill-appearing or diaphoretic.  HENT:     Head: Normocephalic and atraumatic.     Right Ear: External ear normal.     Left Ear: External ear normal.     Nose: Nose normal.  Eyes:     General: Vision grossly intact. Gaze aligned appropriately.     Pupils: Pupils are equal, round, and reactive to light.  Neck:     Musculoskeletal: Normal range of motion.     Trachea: Trachea and phonation normal. No tracheal deviation.  Cardiovascular:     Rate and Rhythm: Regular rhythm. Tachycardia present.     Pulses:          Dorsalis pedis pulses are 2+ on the right side and 2+ on the left side.     Heart sounds: Normal heart sounds.  Pulmonary:     Effort: Pulmonary effort is normal. No accessory muscle usage  or respiratory distress.     Breath sounds: Normal breath sounds and air entry.  Abdominal:     General: There is no distension.     Palpations: Abdomen is soft.     Tenderness: There is abdominal tenderness in the right lower quadrant. There is no guarding or rebound.  Genitourinary:    Comments: Patient underwent pelvic examination prior to arrival at OB/GYN's office, she defers pelvic examination here in the emergency department. Musculoskeletal: Normal range of motion.  Skin:    General: Skin is warm and dry.  Neurological:     Mental Status: She is alert.     GCS: GCS eye subscore is 4. GCS verbal subscore is 5. GCS motor subscore is 6.     Comments: Speech is clear and goal oriented, follows commands Major Cranial nerves without deficit, no facial droop Moves extremities without ataxia, coordination intact  Psychiatric:        Behavior: Behavior normal.    ED Treatments / Results  Labs (all labs ordered are listed, but only abnormal results are displayed) Labs Reviewed  CBC WITH DIFFERENTIAL/PLATELET - Abnormal; Notable for the following components:      Result Value   WBC 10.9 (*)    Neutro Abs 9.1 (*)    All other components within normal limits  CULTURE, BLOOD (ROUTINE X 2)  CULTURE, BLOOD (ROUTINE X 2)  COMPREHENSIVE METABOLIC PANEL  LIPASE, BLOOD  URINALYSIS, ROUTINE W REFLEX MICROSCOPIC  LACTIC ACID, PLASMA  LACTIC ACID, PLASMA  HCG, QUANTITATIVE, PREGNANCY    EKG EKG Interpretation  Date/Time:  Wednesday November 15 2018 13:11:16 EDT Ventricular Rate:  91 PR Interval:    QRS Duration: 78 QT Interval:  347 QTC Calculation: 427 R Axis:   17 Text Interpretation:  Sinus rhythm Probable left atrial enlargement Low voltage, precordial leads RSR' in V1 or V2, probably normal variant Borderline T abnormalities, anterior leads Confirmed by  Lacretia Leigh ([id number]) on 11/15/2018 1:33:02 PM   Radiology No results found.  Procedures Procedures (including critical  care time)  Medications Ordered in ED Medications  ciprofloxacin (CIPRO) IVPB 400 mg (400 mg Intravenous New Bag/Given 11/15/18 1422)    And  metroNIDAZOLE (FLAGYL) IVPB 500 mg (has no administration in time range)  sodium chloride 0.9 % bolus 1,000 mL (1,000 mLs Intravenous New Bag/Given 11/15/18 1421)  acetaminophen (TYLENOL) tablet 650 mg (650 mg Oral Given 11/15/18 1422)  morphine 4 MG/ML injection 4 mg (4 mg Intravenous Given 11/15/18 1427)     Initial Impression / Assessment and Plan / ED Course  I have reviewed the triage vital signs and the nursing notes.  Pertinent labs & imaging results that were available during my care of the patient were reviewed by me and considered in my medical decision making (see chart for details).    43 year old female with history of fibroid presents today from OB/GYN for concern of right lower quadrant pain for the past 2 days with fever.  Fever of 102.37F on arrival tachycardic 110 bpm in room and tachypneic, blood work begun with blood cultures, fluids, antibiotics and CT abdomen pelvis ordered.  Highly concern for appendicitis at this time.  Lower suspicion for ectopic based on history and physical examination.  Case discussed with Dr. Zenia Resides who has seen and evaluated the patient, agrees with work-up at this time - Patient reassessed resting comfortably no acute distress, blood has been drawn, IV fluids and antibiotics running.  Will order pain control, patient with no new complaints.  Additionally as there is high suspicion of need for possible surgical intervention today we will perform in-house testing for COVID-19 per protocol.  Discussed with Dr. Zenia Resides who agrees. - EKG: Sinus rhythm Probable left atrial enlargement Low voltage, precordial leads RSR' in V1 or V2, probably normal variant Borderline T abnormalities, anterior leads Confirmed by Lacretia Leigh  CBC with leukocytosis of 10.9 Lactic 1.5 Lipase 21 CMP with sodium 134, potassium  2.9 Blood cultures, beta-hCG, urinalysis pending CT abdomen pelvis pending COVID pending  Case discussed with Amie Portland, PA-C at shift change, imaging and blood work pending, disposition to be sent by oncoming team, anticipate need for admission.   Note: Portions of this report may have been transcribed using voice recognition software. Every effort was made to ensure accuracy; however, inadvertent computerized transcription errors may still be present. Final Clinical Impressions(s) / ED Diagnoses   Final diagnoses:  None    ED Discharge Orders    None       Gari Crown 11/15/18 1458    Lacretia Leigh, MD 11/16/18 [phone]

## 2018-11-15 NOTE — ED Triage Notes (Signed)
EMS reports from MD office, had LRQ abdominal pain and nausea x 1 month, MD DX fibroid lower abdomen. MD office sent due to tachycardia and hypertensive.  BP 142/84 HR 101 RR 16 Sp02 100 RA  179mcgm Fentanyl enroute. 223ml NS 18ga LAC

## 2018-11-15 NOTE — Telephone Encounter (Signed)
I sitting here in so much pain not knowing what to do I can't take it any more I don't if it's a period or bleeding cause it's not much blood just pain can't take medication be cause I can't really eat. Please help me

## 2018-11-15 NOTE — ED Notes (Signed)
ED TO INPATIENT HANDOFF REPORT  Name/Age/Gender Karen Hopkins 43 y.o. female  Code Status Code Status History    Date Active Date Inactive Code Status Order ID Comments User Context   08/23/2013 1551 08/24/2013 1329 Full Code [id number]  Theodoro Kos, DO Inpatient   03/12/2013 1840 03/16/2013 1346 Full Code [id number]  Sanger, Lyndee Leo, DO Inpatient      Home/SNF/Other Home  Chief Complaint Abdominal Pain; Weakness  Level of Care/Admitting Diagnosis ED Disposition    ED Disposition Condition Comment   [facility] [[id number]]  Level of Care: Med-Surg [16]  Covid Evaluation: Confirmed COVID Negative  Diagnosis: UTI (urinary tract infection) [166063]  Admitting Physician: Alma Friendly [[id number]]  Attending Physician: Alma Friendly [[id number]]  PT Class (Do Not Modify): Observation [104]  PT Acc Code (Do Not Modify): Observation [10022]       Medical History Past Medical History:  Diagnosis Date  . Anxiety   . Cancer ([place]) 2011   Rt. Br. Ca  . Depression   . Essential hypertension 11/05/2015  . History of breast cancer 2011   right  . History of chemotherapy 2011  . History of radiation therapy 2011  . Hypertension    under control with med., has been on med. x 2 yr.  . Lymphedema of arm    right; no BP or puncture to right arm  . Personal history of chemotherapy 09/16/2009   rt breast  . Personal history of radiation therapy 05/2010   rt breast  . Pneumonia   . Seasonal allergies   . Sinus headache     Allergies Allergies  Allergen Reactions  . Penicillins Swelling    FACIAL SWELLING Did it involve swelling of the face/tongue/throat, SOB, or low BP? Yes Did it involve sudden or severe rash/hives, skin peeling, or any reaction on the inside of your mouth or nose? Unknown Did you need to seek medical attention at a hospital or doctor's office? No When did it last happen?More than 10 years If all above  answers are "NO", may proceed with cephalosporin use.   Recardo Evangelist [Pregabalin] Nausea Only    .  Marland Kitchen Meloxicam Nausea Only  . Robaxin [Methocarbamol] Nausea Only    IV Location/Drains/Wounds Patient Lines/Drains/Airways Status   Active Line/Drains/Airways    Name:   Placement date:   Placement time:   Site:   Days:   Peripheral IV 11/15/18 Left Antecubital   11/15/18    1303    Antecubital   less than 1   Incision (Closed) 07/18/18 Leg Left   07/18/18    1051     120          Labs/Imaging Results for orders placed or performed during the hospital encounter of 11/15/18 (from the past 48 hour(s))  CBC with Differential     Status: Abnormal   Collection Time: 11/15/18  2:18 PM  Result Value Ref Range   WBC 10.9 (H) 4.0 - 10.5 K/uL   RBC 4.18 3.87 - 5.11 MIL/uL   Hemoglobin 12.7 12.0 - 15.0 g/dL   HCT 39.0 36.0 - 46.0 %   MCV 93.3 80.0 - 100.0 fL   MCH 30.4 26.0 - 34.0 pg   MCHC 32.6 30.0 - 36.0 g/dL   RDW 13.9 11.5 - 15.5 %   Platelets 214 150 - 400 K/uL   nRBC 0.0 0.0 - 0.2 %   Neutrophils Relative % 84 %   Neutro Abs 9.1 (  H) 1.7 - 7.7 K/uL   Lymphocytes Relative 9 %   Lymphs Abs 1.0 0.7 - 4.0 K/uL   Monocytes Relative 6 %   Monocytes Absolute 0.7 0.1 - 1.0 K/uL   Eosinophils Relative 0 %   Eosinophils Absolute 0.0 0.0 - 0.5 K/uL   Basophils Relative 0 %   Basophils Absolute 0.0 0.0 - 0.1 K/uL   Immature Granulocytes 1 %   Abs Immature Granulocytes 0.05 0.00 - 0.07 K/uL    Comment: Performed at [facility], [place] [address], [place], [place] [address]  Comprehensive metabolic panel     Status: Abnormal   Collection Time: 11/15/18  2:18 PM  Result Value Ref Range   Sodium 134 (L) 135 - 145 mmol/L   Potassium 2.9 (L) 3.5 - 5.1 mmol/L   Chloride 111 98 - 111 mmol/L   CO2 18 (L) 22 - 32 mmol/L   Glucose, Bld 92 70 - 99 mg/dL   BUN 5 (L) 6 - 20 mg/dL   Creatinine, Ser 0.63 0.44 - 1.00 mg/dL   Calcium 8.5 (L) 8.9 - 10.3 mg/dL   Total Protein 7.4 6.5 -  8.1 g/dL   Albumin 4.0 3.5 - 5.0 g/dL   AST 12 (L) 15 - 41 U/L   ALT 10 0 - 44 U/L   Alkaline Phosphatase 68 38 - 126 U/L   Total Bilirubin 0.8 0.3 - 1.2 mg/dL   GFR calc non Af Amer >60 >60 mL/min   GFR calc Af Amer >60 >60 mL/min   Anion gap 5 5 - 15    Comment: Performed at [facility], [place] [address], [place], [place] [address]  Lipase, blood     Status: None   Collection Time: 11/15/18  2:18 PM  Result Value Ref Range   Lipase 21 11 - 51 U/L    Comment: Performed at [facility], [place] [address] [place], [place] [address]  Lactic acid, plasma     Status: None   Collection Time: 11/15/18  2:18 PM  Result Value Ref Range   Lactic Acid, Venous 1.5 0.5 - 1.9 mmol/L    Comment: Performed at [facility], [place] [address], [place], [place] [address]  hCG, quantitative, pregnancy     Status: None   Collection Time: 11/15/18  2:18 PM  Result Value Ref Range   hCG, Beta Chain, Quant, S <1 <5 mIU/mL    Comment:          GEST. AGE      CONC.  (mIU/mL)   <=1 WEEK        5 - 50     2 WEEKS       50 - 500     3 WEEKS       100 - 10,000     4 WEEKS     1,000 - 30,000     5 WEEKS     3,500 - 115,000   6-8 WEEKS     12,000 - 270,000    12 WEEKS     15,000 - 220,000        FEMALE AND NON-PREGNANT FEMALE:     LESS THAN 5 mIU/mL Performed at [facility], [place] [address] [place], [place] [address]   Urinalysis, Routine w reflex microscopic     Status: Abnormal   Collection Time: 11/15/18  3:16 PM  Result Value Ref Range   Color, Urine YELLOW YELLOW  APPearance CLOUDY (A) CLEAR   Specific Gravity, Urine 1.012 1.005 - 1.030   pH 6.0 5.0 - 8.0   Glucose, UA NEGATIVE NEGATIVE mg/dL   Hgb urine dipstick MODERATE (A) NEGATIVE   Bilirubin Urine NEGATIVE NEGATIVE   Ketones, ur 20 (A) NEGATIVE mg/dL   Protein, ur NEGATIVE NEGATIVE mg/dL   Nitrite POSITIVE (A) NEGATIVE   Leukocytes,Ua LARGE (A) NEGATIVE   RBC / HPF  11-20 0 - 5 RBC/hpf   WBC, UA >50 (H) 0 - 5 WBC/hpf   Bacteria, UA MANY (A) NONE SEEN   Squamous Epithelial / LPF 0-5 0 - 5   Mucus PRESENT     Comment: Performed at [facility], [place] [address] [place], [place] [address]  SARS Coronavirus 2 (CEPHEID - Performed in [place] hospital lab), Hosp Order     Status: None   Collection Time: 11/15/18  3:16 PM  Result Value Ref Range   SARS Coronavirus 2 NEGATIVE NEGATIVE    Comment: (NOTE) If result is NEGATIVE SARS-CoV-2 target nucleic acids are NOT DETECTED. The SARS-CoV-2 RNA is generally detectable in upper and lower  respiratory specimens during the acute phase of infection. The lowest  concentration of SARS-CoV-2 viral copies this assay can detect is 250  copies / mL. A negative result does not preclude SARS-CoV-2 infection  and should not be used as the sole basis for treatment or other  patient management decisions.  A negative result may occur with  improper specimen collection / handling, submission of specimen other  than nasopharyngeal swab, presence of viral mutation(s) within the  areas targeted by this assay, and inadequate number of viral copies  (<250 copies / mL). A negative result must be combined with clinical  observations, patient history, and epidemiological information. If result is POSITIVE SARS-CoV-2 target nucleic acids are DETECTED. The SARS-CoV-2 RNA is generally detectable in upper and lower  respiratory specimens dur ing the acute phase of infection.  Positive  results are indicative of active infection with SARS-CoV-2.  Clinical  correlation with patient history and other diagnostic information is  necessary to determine patient infection status.  Positive results do  not rule out bacterial infection or co-infection with other viruses. If result is PRESUMPTIVE POSTIVE SARS-CoV-2 nucleic acids MAY BE PRESENT.   A presumptive positive result was obtained on the submitted specimen  and  confirmed on repeat testing.  While 2019 novel coronavirus  (SARS-CoV-2) nucleic acids may be present in the submitted sample  additional confirmatory testing may be necessary for epidemiological  and / or clinical management purposes  to differentiate between  SARS-CoV-2 and other Sarbecovirus currently known to infect humans.  If clinically indicated additional testing with an alternate test  methodology [id number]) is advised. The SARS-CoV-2 RNA is generally  detectable in upper and lower respiratory sp ecimens during the acute  phase of infection. The expected result is Negative. Fact Sheet for Patients:  StrictlyIdeas.no Fact Sheet for Healthcare Providers: BankingDealers.co.za This test is not yet approved or cleared by the [place] FDA and has been authorized for detection and/or diagnosis of SARS-CoV-2 by FDA under an Emergency Use Authorization (EUA).  This EUA will remain in effect (meaning this test can be used) for the duration of the COVID-19 declaration under Section 564(b)(1) of the Act, 21 U.S.C. section 360bbb-3(b)(1), unless the authorization is terminated or revoked sooner. Performed at [facility], [place] [address], [place],  [address]    *Note: Due to a  large number of results and/or encounters for the requested time period, some results have not been displayed. A complete set of results can be found in Results Review.   Ct Abdomen Pelvis W Contrast  Result Date: 11/15/2018 CLINICAL DATA:  RIGHT lower quadrant abdominal pain for 2 days with cramping, nausea, fever and loss of appetite question appendicitis, history breast cancer, essential hypertension EXAM: CT ABDOMEN AND PELVIS WITH CONTRAST TECHNIQUE: Multidetector CT imaging of the abdomen and pelvis was performed using the standard protocol following bolus administration of intravenous contrast. Sagittal and coronal MPR images  reconstructed from axial data set. CONTRAST:  125mL OMNIPAQUE IOHEXOL 300 MG/ML SOLN IV. No oral contrast. COMPARISON:  PET-CT 09/10/2009 FINDINGS: Lower chest: 2 mm LEFT lower lobe nodule image 6, questionably present on prior PET-CT image 82. Remaining lung bases clear. RIGHT breast prosthesis. Hepatobiliary: Tiny cyst superiorly RIGHT lobe liver unchanged. Focal fatty infiltration of liver adjacent to falciform fissure. Gallbladder and liver otherwise unremarkable Pancreas: Normal appearance Spleen: Normal appearance Adrenals/Urinary Tract: Adrenal glands and LEFT kidney normal appearance. Nonobstructing 5 mm calculus at inferior pole RIGHT kidney. Slight delay and inhomogeneity of RIGHT nephrogram versus LEFT. Minimal collecting system and proximal/mid RIGHT ureteral dilatation with wall thickening and minimal enhancement raising question of urinary tract infection. No ureteral or bladder calculus identified. Stomach/Bowel: Normal appendix. Question tiny appendicular at that appendiceal base period no appendiceal wall thickening or appendiceal dilatation. Stomach and bowel loops otherwise normal appearance for technique. Vascular/Lymphatic: Vascular structures grossly patent. Aorta normal caliber. No adenopathy. Reproductive: Unremarkable uterus. Tubal ligation clip LEFT adnexa. No definite adnexal abnormalities. Other: Umbilical hernia containing fat. Large clip present within the hernia sac question second tubal ligation clips. Mild infiltrative changes which could reflect incarceration or inflammation. No free air free fluid. Musculoskeletal: Osseous structures unremarkable. IMPRESSION: No evidence of appendicitis, though a tiny appendicoliths is present at the appendiceal base. Nonobstructing 5 mm calculus inferior pole RIGHT kidney. Patchy inhomogeneous nephrogram RIGHT kidney with minimal perinephric edema and delay in nephrogram, associated with minimal collecting system/proximal to mid RIGHT ureteral  dilatation and enhancement of the renal pelvic/ureteral wall, raising question of urinary tract infection/pyelonephritis; recommend correlation with urinalysis. No obstructing ureteral calculus identified. Umbilical hernia containing fat, associated with stranding, which could reflect incarceration or inflammation recommend correlation with physical exam. Metallic clip within hernia sac suspect representing RIGHT tubal ligation clip, with LEFT tubal ligation clip in expected position. 2 mm LEFT lower lobe pulmonary nodule, probably unchanged since PET-CT. Electronically Signed   By: Lavonia Dana M.D.   On: 11/15/2018 16:57    Pending Labs Unresulted Labs (From admission, onward)    Start     Ordered   11/15/18 1813  Urine Culture  ONCE - STAT,   R     11/15/18 1812   11/15/18 1330  Blood culture (routine x 2)  BLOOD CULTURE X 2,   STAT     11/15/18 1330   Signed and Held  SARS Coronavirus 2 (CEPHEID - Performed in [place] hospital lab), [facility] Order  Once,   R    Comments:  No isolation needed for this testing (if isolation ordered for another indication, maintain current isolation).   Question:  Pre-procedural testing  Answer:  Yes   Signed and Held   Signed and Held  Basic metabolic panel  Tomorrow morning,   R     Signed and Held   Signed and Held  CBC  Tomorrow morning,   R  Signed and Held          Vitals/Pain Today's Vitals   11/15/18 1938 11/15/18 2000 11/15/18 2030 11/15/18 2132  BP:  126/74 126/84   Pulse:  93 82   Resp:  15 (!) 23   Temp: 100.3 F (37.9 C)     TempSrc: Oral     SpO2:  100% 100%   PainSc:    8     Isolation Precautions No active isolations  Medications Medications  sodium chloride (PF) 0.9 % injection (has no administration in time range)  albuterol (VENTOLIN HFA) 108 (90 Base) MCG/ACT inhaler 2 puff (has no administration in time range)  topiramate (TOPAMAX) tablet 150 mg (has no administration in time range)  potassium chloride 10 mEq in  100 mL IVPB (10 mEq Intravenous Transfusing/Transfer 11/15/18 2208)  cyclobenzaprine (FLEXERIL) tablet 10 mg (has no administration in time range)  ciprofloxacin (CIPRO) IVPB 400 mg (has no administration in time range)  sodium chloride 0.9 % bolus 1,000 mL (0 mLs Intravenous Stopped 11/15/18 1608)  acetaminophen (TYLENOL) tablet 650 mg (650 mg Oral Given 11/15/18 1422)  ciprofloxacin (CIPRO) IVPB 400 mg (0 mg Intravenous Stopped 11/15/18 1526)    And  metroNIDAZOLE (FLAGYL) IVPB 500 mg (0 mg Intravenous Stopped 11/15/18 1708)  morphine 4 MG/ML injection 4 mg (4 mg Intravenous Given 11/15/18 1427)  iohexol (OMNIPAQUE) 300 MG/ML solution 100 mL (100 mLs Intravenous Contrast Given 11/15/18 1617)  morphine 4 MG/ML injection 4 mg (4 mg Intravenous Given 11/15/18 1737)  ibuprofen (ADVIL) tablet 600 mg (600 mg Oral Given 11/15/18 1736)  morphine 4 MG/ML injection 4 mg (4 mg Intravenous Given 11/15/18 2000)  acetaminophen (TYLENOL) tablet 650 mg (650 mg Oral Given 11/15/18 2026)    Mobility walks

## 2018-11-15 NOTE — H&P (Signed)
History and Physical  Karen Hopkins YTW:[id number] DOB: February 17, 1976 DOA: 11/15/2018    PCP: [name], MD  Patient coming from: Home & is able to ambulate independently  Chief Complaint: Right lower quadrant abdominal pain  HPI: Karen Hopkins is a 43 y.o. female with medical history significant for uterine fibroids, history of breast CA, presents to the ER complaining of abdominal pain and noted to have fever.  Patient was seen at her OB/GYN's office today for evaluation of her fibroid and pelvic pain.  Patient was subsequently referred to the ER due to fever.  Patient reports sharp cramping sensation located mainly in her right lower quadrant as well as suprapubic area for the past 3 days.  Pain does not radiate to anywhere.  Nothing makes it better or worse, 8/10 in severity.  Patient reported associated nausea, with an episode of nonbloody/nonbilious vomiting, with an episode of watery BM.  Patient was also noted to be febrile in the ER.  Patient does report vaginal spotting, she may be likely due to her fibroids.  Denies any chest pain, cough, shortness of breath.  ED Course: Patient was noted to be febrile, with some leukocytosis.  UA positive for infection, UC pending.  CT abdomen showed possible pyelonephritis.  Patient also noted to have hypokalemia.  COVID test was negative, pregnancy test negative.  Patient was given Cipro and Flagyl in the ER.  Hospitalist called to admit patient for acute pyelonephritis.  Review of Systems: Review of systems are otherwise negative   Past Medical History:  Diagnosis Date  . Anxiety   . Cancer ([place]) 2011   Rt. Br. Ca  . Depression   . Essential hypertension 11/05/2015  . History of breast cancer 2011   right  . History of chemotherapy 2011  . History of radiation therapy 2011  . Hypertension    under control with med., has been on med. x 2 yr.  . Lymphedema of arm    right; no BP or puncture to right arm  . Personal history of  chemotherapy 09/16/2009   rt breast  . Personal history of radiation therapy 05/2010   rt breast  . Pneumonia   . Seasonal allergies   . Sinus headache    Past Surgical History:  Procedure Laterality Date  . BREAST BIOPSY Right 08/26/2009  . BREAST REDUCTION SURGERY Left 08/23/2013   Procedure: LEFT BREAST REDUCTION  ;  Surgeon: Theodoro Kos, DO;  Location: [place];  Service: Plastics;  Laterality: Left;  . CESAREAN SECTION  07/15/2001; 03/18/2004; 11/21/2008  . lateral orbiotomy Left 11/2015   Dr. Toy Cookey at [facility]  . LATISSIMUS FLAP TO BREAST Right 03/12/2013   Procedure: RIGHT BREAST LATISSIMUS FLAP WITH EXPANDER PLACEMENT;  Surgeon: Theodoro Kos, DO;  Location: [place];  Service: Plastics;  Laterality: Right;  . LIPOSUCTION Bilateral 08/23/2013   Procedure: LIPOSUCTION;  Surgeon: Theodoro Kos, DO;  Location: [place];  Service: Plastics;  Laterality: Bilateral;  . MASTECTOMY Right 2011  . MODIFIED RADICAL MASTECTOMY Right 03/11/2010  . NASAL SEPTUM SURGERY    . ORIF ANKLE FRACTURE Left 07/18/2018   Procedure: OPEN REDUCTION INTERNAL FIXATION (ORIF) LEFT ANKLE FRACTURE WITH  SYNDESMOSIS AND ANKLE ARTHROTOMY;  Surgeon: Erle Crocker, MD;  Location: [place];  Service: Orthopedics;  Laterality: Left;  . PORT-A-CATH REMOVAL Left 12/01/2010  . PORTACATH PLACEMENT Left 09/12/2009  . REDUCTION MAMMAPLASTY Left 08/2013  . REMOVAL OF TISSUE EXPANDER AND PLACEMENT OF  IMPLANT Right 08/23/2013   Procedure: REMOVAL RIGHT TISSUE EXPANDER AND PLACEMENT OF IMPLANT TO RIGHT BREAST ;  Surgeon: Theodoro Kos, DO;  Location: [place];  Service: Plastics;  Laterality: Right;  . SUPRA-UMBILICAL HERNIA  [id number]  . TUBAL LIGATION  11/21/2008    Social History:  reports that she quit smoking about 8 years ago. Her smoking use included cigarettes. She started smoking about 16 years ago. She has a 1.25 pack-year smoking history. She has never used  smokeless tobacco. She reports that she does not drink alcohol or use drugs.   Allergies  Allergen Reactions  . Penicillins Swelling    FACIAL SWELLING Did it involve swelling of the face/tongue/throat, SOB, or low BP? Yes Did it involve sudden or severe rash/hives, skin peeling, or any reaction on the inside of your mouth or nose? Unknown Did you need to seek medical attention at a hospital or doctor's office? No When did it last happen?More than 10 years If all above answers are "NO", may proceed with cephalosporin use.   Recardo Evangelist [Pregabalin] Nausea Only    .  Marland Kitchen Meloxicam Nausea Only  . Robaxin [Methocarbamol] Nausea Only    Family History  Problem Relation Age of Onset  . Hypertension Mother   . Breast cancer Mother 32  . Diabetes type II Father   . Prostate cancer Father   . Stroke Neg Hx   . Colon polyps Neg Hx   . Esophageal cancer Neg Hx   . Pancreatic cancer Neg Hx   . Stomach cancer Neg Hx   . Rectal cancer Neg Hx       Prior to Admission medications   Medication Sig Start Date End Date Taking? Authorizing Provider  albuterol (PROAIR HFA) 108 (90 Base) MCG/ACT inhaler Inhale 2 puffs into the lungs every 6 (six) hours as needed for wheezing or shortness of breath. 10/12/16   [name], MD  diclofenac sodium (VOLTAREN) 1 % GEL Apply 4 g topically 4 (four) times daily. Use as directed 09/19/17   Bayard Hugger, NP  potassium chloride (MICRO-K) 10 MEQ CR capsule Take 1 capsule (10 mEq total) by mouth 2 (two) times daily. 10/02/18   Magrinat, Virgie Dad, MD  tiZANidine (ZANAFLEX) 4 MG tablet TAKE ONE (1) TABLET BY MOUTH 3 TIMES DAILY Patient taking differently: Take 4 mg by mouth every 8 (eight) hours as needed for muscle spasms.  02/27/18   Kirsteins, Luanna Salk, MD  topiramate (TOPAMAX) 50 MG tablet Take 3 tablets (150 mg total) by mouth at bedtime. 06/09/18   Venancio Poisson, NP    Physical Exam: BP 122/66   Pulse 72   Temp (!) 101.2 F (38.4 C) (Oral)    Resp 18   LMP 10/18/2018   SpO2 99%   General: NAD Eyes: Normal ENT: Normal Neck: Supple Cardiovascular: S1, S2 present Respiratory: CTA B Abdomen: Soft, tenderness on the right lower quadrant, suprapubic area, obese, bowel sounds present Skin: No rashes/lesions present Musculoskeletal: No pedal edema noted Psychiatric: Normal mood Neurologic: No focal neurologic deficit noted          Labs on Admission:  Basic Metabolic Panel: Recent Labs  Lab 11/15/18 1418  NA 134*  K 2.9*  CL 111  CO2 18*  GLUCOSE 92  BUN 5*  CREATININE 0.63  CALCIUM 8.5*   Liver Function Tests: Recent Labs  Lab 11/15/18 1418  AST 12*  ALT 10  ALKPHOS 68  BILITOT 0.8  PROT 7.4  ALBUMIN 4.0   Recent Labs  Lab 11/15/18 1418  LIPASE 21   No results for input(s): AMMONIA in the last 168 hours. CBC: Recent Labs  Lab 11/15/18 1418  WBC 10.9*  NEUTROABS 9.1*  HGB 12.7  HCT 39.0  MCV 93.3  PLT 214   Cardiac Enzymes: No results for input(s): CKTOTAL, CKMB, CKMBINDEX, TROPONINI in the last 168 hours.  BNP (last 3 results) No results for input(s): BNP in the last 8760 hours.  ProBNP (last 3 results) No results for input(s): PROBNP in the last 8760 hours.  CBG: No results for input(s): GLUCAP in the last 168 hours.  Radiological Exams on Admission: Ct Abdomen Pelvis W Contrast  Result Date: 11/15/2018 CLINICAL DATA:  RIGHT lower quadrant abdominal pain for 2 days with cramping, nausea, fever and loss of appetite question appendicitis, history breast cancer, essential hypertension EXAM: CT ABDOMEN AND PELVIS WITH CONTRAST TECHNIQUE: Multidetector CT imaging of the abdomen and pelvis was performed using the standard protocol following bolus administration of intravenous contrast. Sagittal and coronal MPR images reconstructed from axial data set. CONTRAST:  131mL OMNIPAQUE IOHEXOL 300 MG/ML SOLN IV. No oral contrast. COMPARISON:  PET-CT 09/10/2009 FINDINGS: Lower chest: 2 mm LEFT lower  lobe nodule image 6, questionably present on prior PET-CT image 82. Remaining lung bases clear. RIGHT breast prosthesis. Hepatobiliary: Tiny cyst superiorly RIGHT lobe liver unchanged. Focal fatty infiltration of liver adjacent to falciform fissure. Gallbladder and liver otherwise unremarkable Pancreas: Normal appearance Spleen: Normal appearance Adrenals/Urinary Tract: Adrenal glands and LEFT kidney normal appearance. Nonobstructing 5 mm calculus at inferior pole RIGHT kidney. Slight delay and inhomogeneity of RIGHT nephrogram versus LEFT. Minimal collecting system and proximal/mid RIGHT ureteral dilatation with wall thickening and minimal enhancement raising question of urinary tract infection. No ureteral or bladder calculus identified. Stomach/Bowel: Normal appendix. Question tiny appendicular at that appendiceal base period no appendiceal wall thickening or appendiceal dilatation. Stomach and bowel loops otherwise normal appearance for technique. Vascular/Lymphatic: Vascular structures grossly patent. Aorta normal caliber. No adenopathy. Reproductive: Unremarkable uterus. Tubal ligation clip LEFT adnexa. No definite adnexal abnormalities. Other: Umbilical hernia containing fat. Large clip present within the hernia sac question second tubal ligation clips. Mild infiltrative changes which could reflect incarceration or inflammation. No free air free fluid. Musculoskeletal: Osseous structures unremarkable. IMPRESSION: No evidence of appendicitis, though a tiny appendicoliths is present at the appendiceal base. Nonobstructing 5 mm calculus inferior pole RIGHT kidney. Patchy inhomogeneous nephrogram RIGHT kidney with minimal perinephric edema and delay in nephrogram, associated with minimal collecting system/proximal to mid RIGHT ureteral dilatation and enhancement of the renal pelvic/ureteral wall, raising question of urinary tract infection/pyelonephritis; recommend correlation with urinalysis. No obstructing  ureteral calculus identified. Umbilical hernia containing fat, associated with stranding, which could reflect incarceration or inflammation recommend correlation with physical exam. Metallic clip within hernia sac suspect representing RIGHT tubal ligation clip, with LEFT tubal ligation clip in expected position. 2 mm LEFT lower lobe pulmonary nodule, probably unchanged since PET-CT. Electronically Signed   By: Lavonia Dana M.D.   On: 11/15/2018 16:57    EKG: Independently reviewed.  Sinus rhythm  Assessment/Plan Present on Admission: . UTI (urinary tract infection) . Obesity  Principal Problem:   UTI (urinary tract infection) Active Problems:   Obesity   Type 2 diabetes mellitus without complication (HCC)  Sepsis likely 2/2 acute pyelonephritis Febrile, with mild leukocytosis UA positive for infection, positive nitrites, positive leukocytes UC pending BC x2 pending LA within normal limits CT abdomen pelvis showed  possible pyelonephritis IV fluids Continue IV ciprofloxacin (patient allergic to penicillin).  Patient had pansensitive E. coli on 10/2018 Advance diet as tolerated  Hypokalemia Replace PRN  Uterine fibroid Follow-up with OB/GYN as outpatient  History of breast CA Status post chemo, radiation, surgery Follow-up with oncology as an outpatient    DVT prophylaxis: Lovenox  Code Status: Full  Family Communication: None at bedside  Disposition Plan: Plan to DC home on 11/16/2018  Consults called: None  Admission status: Observation    Alma Friendly MD [facility]  If 7PM-7AM, please contact night-coverage [email or web address] Password [facility]  11/15/2018, 6:29 PM

## 2018-11-15 NOTE — ED Provider Notes (Signed)
  Physical Exam  BP (!) 119/46   Pulse 78   Temp (!) 101.2 F (38.4 C) (Oral)   Resp (!) 22   LMP 10/18/2018   SpO2 99%   Physical Exam  ED Course/Procedures     Procedures  MDM  5:33 PM Patient care received from [facility] at shift change, she received this pain on sign out from [facility] PA see his note for a full HPI. Briefly, patient with abdominal pain arrived with fever of 102.7, episodes of emesis along with decrease appetite. UA remarkable: Nitrite, large leukocytes, many bacteria along with > 50 WBC. consi She was given rocephin along with cipro to cover her for an intraabdominal infection. She continues to be febrile, given ibuprofen 600 by prior provider. CT Abdomen and Pelvis showed: No evidence of appendicitis, though a tiny appendicoliths is present  at the appendiceal base.    Nonobstructing 5 mm calculus inferior pole RIGHT kidney.    Patchy inhomogeneous nephrogram RIGHT kidney with minimal  perinephric edema and delay in nephrogram, associated with minimal  collecting system/proximal to mid RIGHT ureteral dilatation and  enhancement of the renal pelvic/ureteral wall, raising question of  urinary tract infection/pyelonephritis; recommend correlation with  urinalysis.    No obstructing ureteral calculus identified.    Umbilical hernia containing fat, associated with stranding, which  could reflect incarceration or inflammation recommend correlation  with physical exam.    Metallic clip within hernia sac suspect representing RIGHT tubal  ligation clip, with LEFT tubal ligation clip in expected position.    2 mm LEFT lower lobe pulmonary nodule, probably unchanged since  PET-CT.     Patient was reevaluated by prior provider, she continues to feel unwell at this time will place call for hospitalist admission. Urine consistent with pyelonephritis.  5:39 PM Spoke to hospitalist who will evaluate and admit patient for further management of  pyelonephritis.     Janeece Fitting, PA-C 11/15/18 1740    Dorie Rank, MD 11/16/18 [phone]

## 2018-11-15 NOTE — Addendum Note (Signed)
Addended by: Yisroel Ramming, Dietrich Pates E on: 11/15/2018 05:23 PM   Modules accepted: Orders

## 2018-11-15 NOTE — ED Provider Notes (Signed)
Care assumed by oncoming provider PA [facility].  We will follow-up on pending labs, urine and CT scan and disposition accordingly.  Urinalysis concerning for infection with nitrite positive, large leukocytes and greater than 50 WBCs.  CT scan shows no evidence of appendicitis, however she does have features concerning for urinary tract infection/pyelonephritis which correlates with urinalysis.  Patient evaluated by myself.  Still in significant amount of pain and does not feel able to drink anything due to concerns of emesis.  Concerned that she would not do well at home for treatment of pylo.  Will consult hospitalist to evaluate for admission.   Ward, Ozella Almond, PA-C 11/15/18 [place], DO 11/15/18 1729

## 2018-11-15 NOTE — ED Provider Notes (Signed)
Medical screening examination/treatment/procedure(s) were conducted as a shared visit with non-physician practitioner(s) and myself.  I personally evaluated the patient during the encounter.  EKG Interpretation  Date/Time:  Wednesday November 15 2018 13:11:16 EDT Ventricular Rate:  91 PR Interval:    QRS Duration: 78 QT Interval:  347 QTC Calculation: 427 R Axis:   17 Text Interpretation:  Sinus rhythm Probable left atrial enlargement Low voltage, precordial leads RSR' in V1 or V2, probably normal variant Borderline T abnormalities, anterior leads Confirmed by Lacretia Leigh ([id number]) on 11/15/2018 1:16:61 PM 43 year old female presents with acute onset right lower quadrant abdominal pain which is been constant since yesterday.  Went to see her OB/GYN today and was found to be febrile.  Here she is tender in her right lower quadrant.  Concern for appendicitis.  Will start antibiotics as well as give fluids as well as order CT scan and likely surgical consult.   Lacretia Leigh, MD 11/15/18 [phone]

## 2018-11-15 NOTE — ED Notes (Signed)
Bed: RJ73 Expected date:  Expected time:  Means of arrival:  Comments: EMS/abd. pain

## 2018-11-15 NOTE — Progress Notes (Signed)
GYNECOLOGY  VISIT   HPI: 43 y.o.   Single  African American  female   [phone] with Patient's last menstrual period was 10/18/2018.   here for abdominal pain.  She states cramping and pain on her right side since yesterday.  Having blood with wiping.  Pain is constant. (Patient crying with conversation.) No pain like this in past.   Vomiting since yesterday.  She has had nausea for a couple of months.    Decreased appetite.  Still has her appendix.  Diarrhea once last hs.   She tried to take Ibuprofen on an empty stomach, and she got no relief of pain.  She threw up the the Ibuprofen.   Reporting chills. She feels weak.  Patient has a large fibroid "the size of an orange." This was diagnosed when she went to the ER on 10/18/18 for abdominal pain.  Has a fibroid 4 x 4.5 x 4.1 cm noted on ultrasound from that visit.  Her right ovary was normal and had a normal Doppler.  Her left ovary was not visualized.   Patient has had recent testing for STDs when she went to the ER.  She was dx with BV as well which was not completely treated.  She was dx with an E Coli UTI and she took [place] for this.   Denies pain with urination.  Some frequency to void, she states she is not sure.   She had a colposcopy with EMB for AGUS pap 11/02/18.  Biopsy CIN II.  ECC and EMB benign.   She is scheduled for  LEEP tomorrow.   Her boyfriend is her support [place] and is in [place].   Urine dip - 2+ WBC, 1+ blood, nitrate positive, trace protein.  GYNECOLOGIC HISTORY: Patient's last menstrual period was 10/18/2018. Contraception:  BTL Menopausal hormone therapy:  none Last mammogram:  Scheduled 11/29/18 Last pap smear:   10/19/18 AGUS Pap with +HR HPV; 11/02/18 Colpo showed High grade dysplasia, ECC and EMB negative        OB History    Gravida  3   Para  3   Term  2   Preterm  1   AB      Living  2     SAB      TAB      Ectopic      Multiple      Live Births  2               Patient Active Problem List   Diagnosis Date Noted  . Lymphedema of right arm 02/27/2018  . Dyspnea 09/23/2016  . Eczema 08/31/2016  . Essential hypertension 11/05/2015  . Cervical dystonia 08/25/2015  . Headache 07/09/2015  . Myofascial muscle pain 04/29/2015  . Adhesive capsulitis of right shoulder 04/29/2015  . Type 2 diabetes mellitus without complication ([place]) 03/31/8526  . Lower extremity edema 10/01/2014  . Malignant neoplasm of overlapping sites of right breast in female, estrogen receptor positive ([place]) 05/24/2014  . Myalgia and myositis 05/17/2014  . Neoplasm related pain 03/12/2014  . CN (constipation) 11/26/2013  . H/O reduction mammoplasty 10/30/2013  . Seizure ([place]) 10/06/2013  . Status post bilateral breast implants 08/31/2013  . S/P breast reconstruction, right 08/23/2013  . AC joint arthropathy 07/02/2013  . Routine adult health maintenance 05/29/2013  . Hypokalemia 05/21/2013  . Chronic pain 04/19/2013  . Elevated blood sugar 03/09/2013  . Acquired absence of breast and absent nipple 01/30/2013  . RHINOSINUSITIS, CHRONIC 04/08/2010  .  Obesity 08/04/2006  . DEPRESSIVE DISORDER, NOS 08/04/2006    Past Medical History:  Diagnosis Date  . Anxiety   . Cancer ([place]) 2011   Rt. Br. Ca  . Depression   . Essential hypertension 11/05/2015  . History of breast cancer 2011   right  . History of chemotherapy 2011  . History of radiation therapy 2011  . Hypertension    under control with med., has been on med. x 2 yr.  . Lymphedema of arm    right; no BP or puncture to right arm  . Personal history of chemotherapy 09/16/2009   rt breast  . Personal history of radiation therapy 05/2010   rt breast  . Pneumonia   . Seasonal allergies   . Sinus headache     Past Surgical History:  Procedure Laterality Date  . BREAST BIOPSY Right 08/26/2009  . BREAST REDUCTION SURGERY Left 08/23/2013   Procedure: LEFT BREAST REDUCTION  ;  Surgeon: Theodoro Kos, DO;   Location: [facility];  Service: Plastics;  Laterality: Left;  . CESAREAN SECTION  07/15/2001; 03/18/2004; 11/21/2008  . lateral orbiotomy Left 11/2015   Dr. Toy Cookey at [facility]  . LATISSIMUS FLAP TO BREAST Right 03/12/2013   Procedure: RIGHT BREAST LATISSIMUS FLAP WITH EXPANDER PLACEMENT;  Surgeon: Theodoro Kos, DO;  Location: [facility];  Service: Plastics;  Laterality: Right;  . LIPOSUCTION Bilateral 08/23/2013   Procedure: LIPOSUCTION;  Surgeon: Theodoro Kos, DO;  Location: [facility];  Service: Plastics;  Laterality: Bilateral;  . MASTECTOMY Right 2011  . MODIFIED RADICAL MASTECTOMY Right 03/11/2010  . NASAL SEPTUM SURGERY    . ORIF ANKLE FRACTURE Left 07/18/2018   Procedure: OPEN REDUCTION INTERNAL FIXATION (ORIF) LEFT ANKLE FRACTURE WITH  SYNDESMOSIS AND ANKLE ARTHROTOMY;  Surgeon: Erle Crocker, MD;  Location: [facility];  Service: Orthopedics;  Laterality: Left;  . PORT-A-CATH REMOVAL Left 12/01/2010  . PORTACATH PLACEMENT Left 09/12/2009  . REDUCTION MAMMAPLASTY Left 08/2013  . REMOVAL OF TISSUE EXPANDER AND PLACEMENT OF IMPLANT Right 08/23/2013   Procedure: REMOVAL RIGHT TISSUE EXPANDER AND PLACEMENT OF IMPLANT TO RIGHT BREAST ;  Surgeon: Theodoro Kos, DO;  Location: [facility];  Service: Plastics;  Laterality: Right;  . SUPRA-UMBILICAL HERNIA  [id number]  . TUBAL LIGATION  11/21/2008    Current Outpatient Medications  Medication Sig Dispense Refill  . albuterol (PROAIR HFA) 108 (90 Base) MCG/ACT inhaler Inhale 2 puffs into the lungs every 6 (six) hours as needed for wheezing or shortness of breath. 18 g 3  . diclofenac sodium (VOLTAREN) 1 % GEL Apply 4 g topically 4 (four) times daily. Use as directed 3 Tube 4  . potassium chloride (MICRO-K) 10 MEQ CR capsule Take 1 capsule (10 mEq total) by mouth 2 (two) times daily. 30 capsule 3  . tiZANidine (ZANAFLEX) 4 MG tablet TAKE ONE (1) TABLET BY MOUTH 3 TIMES DAILY (Patient taking differently:  Take 4 mg by mouth every 8 (eight) hours as needed for muscle spasms. ) 90 tablet 3  . topiramate (TOPAMAX) 50 MG tablet Take 3 tablets (150 mg total) by mouth at bedtime. 90 tablet 3   No current facility-administered medications for this visit.      ALLERGIES: Penicillins; Lyrica [pregabalin]; Meloxicam; and Robaxin [methocarbamol]  Family History  Problem Relation Age of Onset  . Hypertension Mother   . Breast cancer Mother 77  . Diabetes type II Father   . Prostate cancer Father   . Stroke  Neg Hx   . Colon polyps Neg Hx   . Esophageal cancer Neg Hx   . Pancreatic cancer Neg Hx   . Stomach cancer Neg Hx   . Rectal cancer Neg Hx     Social History   Socioeconomic History  . Marital status: Single    Spouse name: Not on file  . Number of children: 3  . Years of education: 86  . Highest education level: Not on file  Occupational History  . Occupation: Disability   Social Needs  . Financial resource strain: Not on file  . Food insecurity:    Worry: Not on file    Inability: Not on file  . Transportation needs:    Medical: Not on file    Non-medical: Not on file  Tobacco Use  . Smoking status: Former Smoker    Packs/day: 0.25    Years: 5.00    Pack years: 1.25    Types: Cigarettes    Start date: 06/07/2002    Last attempt to quit: 04/26/2010    Years since quitting: 8.5  . Smokeless tobacco: Never Used  Substance and Sexual Activity  . Alcohol use: No  . Drug use: No  . Sexual activity: Yes    Birth control/protection: Surgical  Lifestyle  . Physical activity:    Days per week: Not on file    Minutes per session: Not on file  . Stress: Not on file  Relationships  . Social connections:    Talks on phone: Not on file    Gets together: Not on file    Attends religious service: Not on file    Active member of club or organization: Not on file    Attends meetings of clubs or organizations: Not on file    Relationship status: Not on file  . Intimate partner  violence:    Fear of current or ex partner: Not on file    Emotionally abused: Not on file    Physically abused: Not on file    Forced sexual activity: Not on file  Other Topics Concern  . Not on file  Social History Narrative   Lives with kids   Caffeine use: none     Review of Systems  Constitutional: Positive for appetite change, chills and fatigue.  HENT: Negative.   Eyes: Negative.   Respiratory: Negative.   Cardiovascular: Negative.   Gastrointestinal: Positive for abdominal pain, diarrhea, nausea and vomiting.  Endocrine: Negative.   Genitourinary: Positive for frequency and urgency.       Spotting   Musculoskeletal: Negative.   Skin: Negative.   Allergic/Immunologic: Negative.   Neurological: Negative.   Hematological: Negative.   Psychiatric/Behavioral: Negative.     PHYSICAL EXAMINATION:    BP 118/88 (BP Location: Left Arm, Patient Position: Sitting, Cuff Size: Normal)   Pulse 80   Temp 98.4 F (36.9 C) (Temporal)   Resp 14   Wt 188 lb (85.3 kg)   LMP 10/18/2018   BMI 32.27 kg/m     General appearance: alert, cooperative and appears stated age.  Tearful. Head: Normocephalic, without obvious abnormality, atraumatic Lungs: clear to auscultation bilaterally Heart: regular rhythm and tachycardia present.  Abdomen: soft, no masses,  no organomegaly, RLQ tenderness with some guarding and no rebound.  No abnormal inguinal nodes palpated Neurologic: Grossly normal  Pelvic: External genitalia:  no lesions              Urethra:  normal appearing urethra with no masses, tenderness  or lesions              Bartholins and Skenes: normal                 Vagina: normal appearing vagina with normal color and discharge, no lesions              Cervix: no lesions.  Small amount of vaginal bleeding.                 Bimanual Exam:  Uterus:  10 week size, globular, and slightly tender.               Adnexa: no mass, fullness, tenderness           Chaperone was present  for exam.  ASSESSMENT  RLQ pain.  Unclear etiology. Appendicitis? Uterine fibroid.  Degeneration? Suspect UTI.  Status post recent colposcopy with cervical biopsy and endometrial biopsy.  Dx HGSIL.  Recent bacterial vaginosis, partially treated.  Fever, chills, tachycardia, GI symptoms.   PLAN  Affirm collected for wet prep.    Urine micro and culture sent.  GC/CT sent.  Discussion of possible reasons for pain and reasons for fever. EMS called to take patient to hospital for further evaluation and treatment.  Patient agrees to this. May need CT scan of abdomen and pelvis, IVF, pain medication, and parenteral abx.  Anticipate Covid 19 testing.  Will cancel LEEP for tomorrow and reschedule when she is better and not on her menstruation.    An After Visit Summary was printed and given to the patient.  ___25____ minutes face to face time of which over 50% was spent in counseling.

## 2018-11-15 NOTE — Telephone Encounter (Signed)
Spoke with patient. S/p colpo on 11/02/18, scheduled for LEEP 11/16/18. Reports abdominal pain mid to right side 7/10 that started on 6/9. Describes as crampy. Took ibuprofen yesterday, experienced vomiting after. Watery stools last night. No appetite. Chills, unable to take temp. LMP 11/12/18, spotting. BTL for contraception. Denies any upper respiratory symptoms. Advised further evaluation needed. Advised patient Dr. Sabra Heck is out of the office today, patient agreeable to seeing covering provider. Patient placed on brief hold, reviewed call with Dr. Quincy Simmonds. Patient scheduled for OV today at 11am with Dr. Quincy Simmonds. Covid 19 precautions reviewed.    Routing to provider for final review. Patient is agreeable to disposition. Will close encounter.  Cc: Dr. Sabra Heck

## 2018-11-15 NOTE — ED Notes (Signed)
Pt asked for a blanket and one was brought to her but pt was found to have a fever.  It was explained to the pt that this RN was unable to provide her with a blanket until her fever broke.  Pt expressed understanding but was frustrated.

## 2018-11-16 ENCOUNTER — Ambulatory Visit: Payer: Medicare Other | Admitting: Obstetrics & Gynecology

## 2018-11-16 DIAGNOSIS — Z8042 Family history of malignant neoplasm of prostate: Secondary | ICD-10-CM | POA: Diagnosis not present

## 2018-11-16 DIAGNOSIS — Z20828 Contact with and (suspected) exposure to other viral communicable diseases: Secondary | ICD-10-CM | POA: Diagnosis present

## 2018-11-16 DIAGNOSIS — E876 Hypokalemia: Secondary | ICD-10-CM | POA: Diagnosis not present

## 2018-11-16 DIAGNOSIS — R109 Unspecified abdominal pain: Secondary | ICD-10-CM | POA: Diagnosis present

## 2018-11-16 DIAGNOSIS — R1011 Right upper quadrant pain: Secondary | ICD-10-CM | POA: Diagnosis not present

## 2018-11-16 DIAGNOSIS — Z9221 Personal history of antineoplastic chemotherapy: Secondary | ICD-10-CM | POA: Diagnosis not present

## 2018-11-16 DIAGNOSIS — R1084 Generalized abdominal pain: Secondary | ICD-10-CM | POA: Diagnosis not present

## 2018-11-16 DIAGNOSIS — Z6832 Body mass index (BMI) 32.0-32.9, adult: Secondary | ICD-10-CM | POA: Diagnosis not present

## 2018-11-16 DIAGNOSIS — I10 Essential (primary) hypertension: Secondary | ICD-10-CM | POA: Diagnosis present

## 2018-11-16 DIAGNOSIS — D259 Leiomyoma of uterus, unspecified: Secondary | ICD-10-CM | POA: Diagnosis present

## 2018-11-16 DIAGNOSIS — N10 Acute tubulo-interstitial nephritis: Secondary | ICD-10-CM | POA: Diagnosis not present

## 2018-11-16 DIAGNOSIS — Z803 Family history of malignant neoplasm of breast: Secondary | ICD-10-CM | POA: Diagnosis not present

## 2018-11-16 DIAGNOSIS — A419 Sepsis, unspecified organism: Secondary | ICD-10-CM | POA: Diagnosis present

## 2018-11-16 DIAGNOSIS — Z833 Family history of diabetes mellitus: Secondary | ICD-10-CM | POA: Diagnosis not present

## 2018-11-16 DIAGNOSIS — Z880 Allergy status to penicillin: Secondary | ICD-10-CM | POA: Diagnosis not present

## 2018-11-16 DIAGNOSIS — E119 Type 2 diabetes mellitus without complications: Secondary | ICD-10-CM | POA: Diagnosis present

## 2018-11-16 DIAGNOSIS — Z888 Allergy status to other drugs, medicaments and biological substances status: Secondary | ICD-10-CM | POA: Diagnosis not present

## 2018-11-16 DIAGNOSIS — Z923 Personal history of irradiation: Secondary | ICD-10-CM | POA: Diagnosis not present

## 2018-11-16 DIAGNOSIS — Z87891 Personal history of nicotine dependence: Secondary | ICD-10-CM | POA: Diagnosis not present

## 2018-11-16 DIAGNOSIS — Z8249 Family history of ischemic heart disease and other diseases of the circulatory system: Secondary | ICD-10-CM | POA: Diagnosis not present

## 2018-11-16 DIAGNOSIS — Z853 Personal history of malignant neoplasm of breast: Secondary | ICD-10-CM | POA: Diagnosis not present

## 2018-11-16 DIAGNOSIS — E669 Obesity, unspecified: Secondary | ICD-10-CM | POA: Diagnosis present

## 2018-11-16 LAB — BASIC METABOLIC PANEL
Anion gap: 6 (ref 5–15)
BUN: <5 mg/dL — ABNORMAL LOW (ref 6–20)
CO2: 17 mmol/L — ABNORMAL LOW (ref 22–32)
Calcium: 8.2 mg/dL — ABNORMAL LOW (ref 8.9–10.3)
Chloride: 115 mmol/L — ABNORMAL HIGH (ref 98–111)
Creatinine, Ser: 0.56 mg/dL (ref 0.44–1.00)
GFR calc Af Amer: >60 mL/min (ref >60)
GFR calc non Af Amer: >60 mL/min (ref >60)
Glucose, Bld: 87 mg/dL (ref 70–99)
Potassium: 3.1 mmol/L — ABNORMAL LOW (ref 3.5–5.1)
Sodium: 138 mmol/L (ref 135–145)

## 2018-11-16 LAB — VAGINITIS/VAGINOSIS, DNA PROBE
Candida Species: NEGATIVE
Gardnerella vaginalis: POSITIVE — AB
Trichomonas vaginosis: NEGATIVE

## 2018-11-16 LAB — URINE CULTURE

## 2018-11-16 LAB — CBC
HCT: 34.8 % — ABNORMAL LOW (ref 36.0–46.0)
Hemoglobin: 11 g/dL — ABNORMAL LOW (ref 12.0–15.0)
MCH: 29.7 pg (ref 26.0–34.0)
MCHC: 31.6 g/dL (ref 30.0–36.0)
MCV: 94.1 fL (ref 80.0–100.0)
Platelets: 188 10*3/uL (ref 150–400)
RBC: 3.7 MIL/uL — ABNORMAL LOW (ref 3.87–5.11)
RDW: 13.9 % (ref 11.5–15.5)
WBC: 9.9 10*3/uL (ref 4.0–10.5)
nRBC: 0 % (ref 0.0–0.2)

## 2018-11-16 LAB — URINALYSIS, MICROSCOPIC ONLY
Casts: NONE SEEN /lpf
WBC, UA: >30 /hpf — AB (ref 0–5)

## 2018-11-16 LAB — MAGNESIUM: Magnesium: 2.1 mg/dL (ref 1.7–2.4)

## 2018-11-16 MED ORDER — POTASSIUM CHLORIDE CRYS ER 20 MEQ PO TBCR
40.0000 meq | EXTENDED_RELEASE_TABLET | Freq: Once | ORAL | Status: AC
  Administered 2018-11-16: 09:00:00 40 meq via ORAL
  Filled 2018-11-16: qty 2

## 2018-11-16 NOTE — Progress Notes (Signed)
PROGRESS NOTE  Karen Hopkins Karen Hopkins DOB: 1976/02/06 DOA: 11/15/2018 PCP: [name], MD  HPI/Recap of past 24 hours: Karen Hopkins is a 43 y.o. female with medical history significant for uterine fibroids, history of breast CA, presents to the ER complaining of abdominal pain and noted to have fever.  Patient was seen at her OB/GYN's office today for evaluation of her fibroid and pelvic pain.  Patient was subsequently referred to the ER due to fever.  Patient reports sharp cramping sensation located mainly in her right lower quadrant as well as suprapubic area for the past 3 days.  Pain does not radiate to anywhere.  Nothing makes it better or worse, 8/10 in severity.  Patient reported associated nausea, with an episode of nonbloody/nonbilious vomiting, with an episode of watery BM.  Patient was also noted to be febrile in the ER.  Patient does report vaginal spotting, she may be likely due to her fibroids.  Denies any chest pain, cough, shortness of breath. In the ED, patient was noted to be febrile, with some leukocytosis.  UA positive for infection, UC pending.  CT abdomen showed possible pyelonephritis.  Patient also noted to have hypokalemia.  COVID test was negative, pregnancy test negative.  Patient was given Cipro and Flagyl in the ER.  Hospitalist called to admit patient for acute pyelonephritis.   Today, patient still reported some sharp pain in her right lower quadrant, still feels nauseous, struggling to keep food down.  Patient will still require IV fluids, IV Zofran as needed and IV antibiotics.  Patient denies any shortness of breath, chest pain, diarrhea.  Assessment/Plan: Principal Problem:   UTI (urinary tract infection) Active Problems:   Obesity   Type 2 diabetes mellitus without complication (HCC)   Acute pyelonephritis  Sepsis likely 2/2 acute pyelonephritis Febrile, with mild leukocytosis on admission Currently afebrile, with resolving  leukocytosis UA positive for infection, positive nitrites, positive leukocytes UC pending collection BC x2 NGTD LA within normal limits CT abdomen pelvis showed possible pyelonephritis IV fluids Continue IV ciprofloxacin (patient allergic to penicillin).  Patient had pansensitive E. coli on 10/2018 Advance diet as tolerated  Appendicoliths Patient still complaining of right lower quadrant abdominal pain CT abdomen/pelvis showed no evidence of appendicitis, though a tiny appendicolith is present at the appendiceal base Spoke to general surgery PA, who stated no further recommendation, unlikely the cause of her abdominal pain Monitor closely  Hypokalemia Replace PRN  Uterine fibroid Follow-up with OB/GYN as outpatient  History of breast CA Status post chemo, radiation, surgery Follow-up with oncology as an outpatient          Malnutrition Type:      Malnutrition Characteristics:      Nutrition Interventions:       Estimated body mass index is 32.28 kg/m as calculated from the following:   Height as of this encounter: 5\' 4"  (1.626 m).   Weight as of this encounter: 85.3 kg.     Code Status: Full  Family Communication: None at bedside  Disposition Plan: Home once significant clinical improvement   Consultants:  Spoke to general surgery  Procedures:  None  Antimicrobials:  Ciprofloxacin  DVT prophylaxis: Lovenox   Objective: Vitals:   11/15/18 2030 11/15/18 2230 11/16/18 0645 11/16/18 1350  BP: 126/84 116/68 129/79 101/76  Pulse: 82 90 70 78  Resp: (!) 23 (!) 22 18 18   Temp:  98.1 F (36.7 C) 97.6 F (36.4 C) 98.4 F (36.9 C)  TempSrc:  Oral  Oral  SpO2: 100% 100% 100% 100%  Weight:  85.3 kg    Height:  5\' 4"  (1.626 m)      Intake/Output Summary (Last 24 hours) at 11/16/2018 1445 Last data filed at 11/16/2018 1224 Gross per 24 hour  Intake 3953.7 ml  Output 1400 ml  Net 2553.7 ml   Filed Weights   11/15/18 2230  Weight:  85.3 kg    Exam:  General: NAD   Cardiovascular: S1, S2 present  Respiratory: CTAB  Abdomen: Soft, ++tender @RLQ , obese, bowel sounds present  Musculoskeletal: No bilateral pedal edema noted  Skin: Normal  Psychiatry: Normal mood   Data Reviewed: CBC: Recent Labs  Lab 11/15/18 1418 11/16/18 0501  WBC 10.9* 9.9  NEUTROABS 9.1*  --   HGB 12.7 11.0*  HCT 39.0 34.8*  MCV 93.3 94.1  PLT 214 629   Basic Metabolic Panel: Recent Labs  Lab 11/15/18 1418 11/16/18 0501  NA 134* 138  K 2.9* 3.1*  CL 111 115*  CO2 18* 17*  GLUCOSE 92 87  BUN 5* <5*  CREATININE 0.63 0.56  CALCIUM 8.5* 8.2*  MG  --  2.1   GFR: Estimated Creatinine Clearance: 98.7 mL/min (by C-G formula based on SCr of 0.56 mg/dL). Liver Function Tests: Recent Labs  Lab 11/15/18 1418  AST 12*  ALT 10  ALKPHOS 68  BILITOT 0.8  PROT 7.4  ALBUMIN 4.0   Recent Labs  Lab 11/15/18 1418  LIPASE 21   No results for input(s): AMMONIA in the last 168 hours. Coagulation Profile: No results for input(s): INR, PROTIME in the last 168 hours. Cardiac Enzymes: No results for input(s): CKTOTAL, CKMB, CKMBINDEX, TROPONINI in the last 168 hours. BNP (last 3 results) No results for input(s): PROBNP in the last 8760 hours. HbA1C: No results for input(s): HGBA1C in the last 72 hours. CBG: No results for input(s): GLUCAP in the last 168 hours. Lipid Profile: No results for input(s): CHOL, HDL, LDLCALC, TRIG, CHOLHDL, LDLDIRECT in the last 72 hours. Thyroid Function Tests: No results for input(s): TSH, T4TOTAL, FREET4, T3FREE, THYROIDAB in the last 72 hours. Anemia Panel: No results for input(s): VITAMINB12, FOLATE, FERRITIN, TIBC, IRON, RETICCTPCT in the last 72 hours. Urine analysis:    Component Value Date/Time   COLORURINE YELLOW 11/15/2018 1516   APPEARANCEUR CLOUDY (A) 11/15/2018 1516   LABSPEC 1.012 11/15/2018 1516   LABSPEC 1.015 11/24/2011 1035   PHURINE 6.0 11/15/2018 1516   GLUCOSEU  NEGATIVE 11/15/2018 1516   HGBUR MODERATE (A) 11/15/2018 1516   HGBUR negative 09/25/2008 0834   BILIRUBINUR NEGATIVE 11/15/2018 1516   BILIRUBINUR n 11/15/2018 1130   BILIRUBINUR Negative 11/24/2011 1035   KETONESUR 20 (A) 11/15/2018 1516   PROTEINUR NEGATIVE 11/15/2018 1516   UROBILINOGEN negative (A) 11/15/2018 1130   UROBILINOGEN 0.2 04/14/2010 2040   NITRITE POSITIVE (A) 11/15/2018 1516   LEUKOCYTESUR LARGE (A) 11/15/2018 1516   LEUKOCYTESUR Negative 11/24/2011 1035   Sepsis Labs: @LABRCNTIP (procalcitonin:4,lacticidven:4)  ) Recent Results (from the past 240 hour(s))  Vaginitis/Vaginosis, DNA Probe     Status: Abnormal   Collection Time: 11/15/18  1:16 PM   Specimen: Genital   GE  Result Value Ref Range Status   Candida Species Negative Negative Final   Gardnerella vaginalis Positive (A) Negative Final   Trichomonas vaginosis Negative Negative Final  Blood culture (routine x 2)     Status: None (Preliminary result)   Collection Time: 11/15/18  2:18 PM   Specimen: BLOOD  Result  Value Ref Range Status   Specimen Description   Final    BLOOD LEFT ANTECUBITAL Performed at [facility] [address] [place], [place] [address]    Special Requests   Final    BOTTLES DRAWN AEROBIC AND ANAEROBIC Blood Culture adequate volume Performed at [facility] [address], [place], [place] [address]    Culture   Final    NO GROWTH < 24 HOURS Performed at [facility] [address], [place], [place] [address]    Report Status PENDING  Incomplete  Blood culture (routine x 2)     Status: None (Preliminary result)   Collection Time: 11/15/18  2:30 PM   Specimen: BLOOD  Result Value Ref Range Status   Specimen Description   Final    BLOOD RIGHT ANTECUBITAL Performed at [facility] [address] [place], [place] [address]    Special Requests   Final    BOTTLES DRAWN AEROBIC AND ANAEROBIC Blood Culture  adequate volume Performed at [facility] [address], [place], [place] [address]    Culture   Final    NO GROWTH < 24 HOURS Performed at [facility] [address] [place], [place] [address]    Report Status PENDING  Incomplete  SARS Coronavirus 2 (CEPHEID - Performed in [place] hospital lab), Hosp Order     Status: None   Collection Time: 11/15/18  3:16 PM   Specimen: Nasopharyngeal Swab  Result Value Ref Range Status   SARS Coronavirus 2 NEGATIVE NEGATIVE Final    Comment: (NOTE) If result is NEGATIVE SARS-CoV-2 target nucleic acids are NOT DETECTED. The SARS-CoV-2 RNA is generally detectable in upper and lower  respiratory specimens during the acute phase of infection. The lowest  concentration of SARS-CoV-2 viral copies this assay can detect is 250  copies / mL. A negative result does not preclude SARS-CoV-2 infection  and should not be used as the sole basis for treatment or other  patient management decisions.  A negative result may occur with  improper specimen collection / handling, submission of specimen other  than nasopharyngeal swab, presence of viral mutation(s) within the  areas targeted by this assay, and inadequate number of viral copies  (<250 copies / mL). A negative result must be combined with clinical  observations, patient history, and epidemiological information. If result is POSITIVE SARS-CoV-2 target nucleic acids are DETECTED. The SARS-CoV-2 RNA is generally detectable in upper and lower  respiratory specimens dur ing the acute phase of infection.  Positive  results are indicative of active infection with SARS-CoV-2.  Clinical  correlation with patient history and other diagnostic information is  necessary to determine patient infection status.  Positive results do  not rule out bacterial infection or co-infection with other viruses. If result is PRESUMPTIVE POSTIVE SARS-CoV-2 nucleic acids MAY BE PRESENT.   A  presumptive positive result was obtained on the submitted specimen  and confirmed on repeat testing.  While 2019 novel coronavirus  (SARS-CoV-2) nucleic acids may be present in the submitted sample  additional confirmatory testing may be necessary for epidemiological  and / or clinical management purposes  to differentiate between  SARS-CoV-2 and other Sarbecovirus currently known to infect humans.  If clinically indicated additional testing with an alternate test  methodology [phone]) is advised. The SARS-CoV-2 RNA is generally  detectable in upper and lower respiratory sp ecimens during the acute  phase of infection. The  expected result is Negative. Fact Sheet for Patients:  StrictlyIdeas.no Fact Sheet for Healthcare Providers: BankingDealers.co.za This test is not yet approved or cleared by the [place] FDA and has been authorized for detection and/or diagnosis of SARS-CoV-2 by FDA under an Emergency Use Authorization (EUA).  This EUA will remain in effect (meaning this test can be used) for the duration of the COVID-19 declaration under Section 564(b)(1) of the Act, 21 U.S.C. section 360bbb-3(b)(1), unless the authorization is terminated or revoked sooner. Performed at [facility] [place] [address] [place], [place] [address]   Urine Culture     Status: None   Collection Time: 11/15/18  3:16 PM   Specimen: Urine, Random  Result Value Ref Range Status   Specimen Description   Final    URINE, RANDOM Performed at [facility] [address], [place], [place] [address]    Special Requests   Final    NONE Performed at [facility], [place] [address] [place], [place] [address]    Culture   Final    Multiple bacterial morphotypes present, none predominant. Suggest appropriate recollection if clinically indicated.   Report Status 11/16/2018 FINAL  Final      Studies: Ct  Abdomen Pelvis W Contrast  Result Date: 11/15/2018 CLINICAL DATA:  RIGHT lower quadrant abdominal pain for 2 days with cramping, nausea, fever and loss of appetite question appendicitis, history breast cancer, essential hypertension EXAM: CT ABDOMEN AND PELVIS WITH CONTRAST TECHNIQUE: Multidetector CT imaging of the abdomen and pelvis was performed using the standard protocol following bolus administration of intravenous contrast. Sagittal and coronal MPR images reconstructed from axial data set. CONTRAST:  19mL OMNIPAQUE IOHEXOL 300 MG/ML SOLN IV. No oral contrast. COMPARISON:  PET-CT 09/10/2009 FINDINGS: Lower chest: 2 mm LEFT lower lobe nodule image 6, questionably present on prior PET-CT image 82. Remaining lung bases clear. RIGHT breast prosthesis. Hepatobiliary: Tiny cyst superiorly RIGHT lobe liver unchanged. Focal fatty infiltration of liver adjacent to falciform fissure. Gallbladder and liver otherwise unremarkable Pancreas: Normal appearance Spleen: Normal appearance Adrenals/Urinary Tract: Adrenal glands and LEFT kidney normal appearance. Nonobstructing 5 mm calculus at inferior pole RIGHT kidney. Slight delay and inhomogeneity of RIGHT nephrogram versus LEFT. Minimal collecting system and proximal/mid RIGHT ureteral dilatation with wall thickening and minimal enhancement raising question of urinary tract infection. No ureteral or bladder calculus identified. Stomach/Bowel: Normal appendix. Question tiny appendicular at that appendiceal base period no appendiceal wall thickening or appendiceal dilatation. Stomach and bowel loops otherwise normal appearance for technique. Vascular/Lymphatic: Vascular structures grossly patent. Aorta normal caliber. No adenopathy. Reproductive: Unremarkable uterus. Tubal ligation clip LEFT adnexa. No definite adnexal abnormalities. Other: Umbilical hernia containing fat. Large clip present within the hernia sac question second tubal ligation clips. Mild infiltrative  changes which could reflect incarceration or inflammation. No free air free fluid. Musculoskeletal: Osseous structures unremarkable. IMPRESSION: No evidence of appendicitis, though a tiny appendicoliths is present at the appendiceal base. Nonobstructing 5 mm calculus inferior pole RIGHT kidney. Patchy inhomogeneous nephrogram RIGHT kidney with minimal perinephric edema and delay in nephrogram, associated with minimal collecting system/proximal to mid RIGHT ureteral dilatation and enhancement of the renal pelvic/ureteral wall, raising question of urinary tract infection/pyelonephritis; recommend correlation with urinalysis. No obstructing ureteral calculus identified. Umbilical hernia containing fat, associated with stranding, which could reflect incarceration or inflammation recommend correlation with physical exam. Metallic clip within hernia sac suspect representing RIGHT tubal ligation clip, with LEFT tubal ligation clip in expected position. 2  mm LEFT lower lobe pulmonary nodule, probably unchanged since PET-CT. Electronically Signed   By: Lavonia Dana M.D.   On: 11/15/2018 16:57    Scheduled Meds: . enoxaparin (LOVENOX) injection  40 mg Subcutaneous QHS  . topiramate  150 mg Oral QHS    Continuous Infusions: . sodium chloride 100 mL/hr at 11/16/18 1000  . ciprofloxacin Stopped (11/16/18 0945)     LOS: 0 days     Alma Friendly, MD [facility]  If 7PM-7AM, please contact night-coverage [email or web address] 11/16/2018, 2:45 PM

## 2018-11-17 ENCOUNTER — Inpatient Hospital Stay (HOSPITAL_COMMUNITY): Payer: Medicare Other

## 2018-11-17 LAB — PROCALCITONIN: Procalcitonin: 0.2 ng/mL

## 2018-11-17 LAB — BASIC METABOLIC PANEL
Anion gap: 8 (ref 5–15)
BUN: 33 mg/dL — ABNORMAL HIGH (ref 6–20)
CO2: 26 mmol/L (ref 22–32)
Calcium: 8.5 mg/dL — ABNORMAL LOW (ref 8.9–10.3)
Chloride: 108 mmol/L (ref 98–111)
Creatinine, Ser: 0.94 mg/dL (ref 0.44–1.00)
GFR calc Af Amer: >60 mL/min (ref >60)
GFR calc non Af Amer: >60 mL/min (ref >60)
Glucose, Bld: 113 mg/dL — ABNORMAL HIGH (ref 70–99)
Potassium: 4.2 mmol/L (ref 3.5–5.1)
Sodium: 142 mmol/L (ref 135–145)

## 2018-11-17 LAB — CBC WITH DIFFERENTIAL/PLATELET
Abs Immature Granulocytes: 0.15 10*3/uL — ABNORMAL HIGH (ref 0.00–0.07)
Basophils Absolute: 0 10*3/uL (ref 0.0–0.1)
Basophils Relative: 0 %
Eosinophils Absolute: 0 10*3/uL (ref 0.0–0.5)
Eosinophils Relative: 0 %
HCT: 29 % — ABNORMAL LOW (ref 36.0–46.0)
Hemoglobin: 9.2 g/dL — ABNORMAL LOW (ref 12.0–15.0)
Immature Granulocytes: 1 %
Lymphocytes Relative: 20 %
Lymphs Abs: 2.3 10*3/uL (ref 0.7–4.0)
MCH: 30.4 pg (ref 26.0–34.0)
MCHC: 31.7 g/dL (ref 30.0–36.0)
MCV: 95.7 fL (ref 80.0–100.0)
Monocytes Absolute: 1.6 10*3/uL — ABNORMAL HIGH (ref 0.1–1.0)
Monocytes Relative: 13 %
Neutro Abs: 7.7 10*3/uL (ref 1.7–7.7)
Neutrophils Relative %: 66 %
Platelets: 230 10*3/uL (ref 150–400)
RBC: 3.03 MIL/uL — ABNORMAL LOW (ref 3.87–5.11)
RDW: 12.8 % (ref 11.5–15.5)
WBC: 11.8 10*3/uL — ABNORMAL HIGH (ref 4.0–10.5)
nRBC: 0 % (ref 0.0–0.2)

## 2018-11-17 LAB — URINE CULTURE: Culture: NO GROWTH

## 2018-11-17 LAB — CERVICOVAGINAL ANCILLARY ONLY
Chlamydia: NEGATIVE
Neisseria Gonorrhea: NEGATIVE

## 2018-11-17 NOTE — Progress Notes (Signed)
PROGRESS NOTE  Karen Hopkins WCB:[id number] DOB: January 16, 1976 DOA: 11/15/2018 PCP: [name], MD  HPI/Recap of past 24 hours: Karen Hopkins is a 43 y.o. female with medical history significant for uterine fibroids, history of breast CA, presents to the ER complaining of abdominal pain and noted to have fever.  Patient was seen at her OB/GYN's office today for evaluation of her fibroid and pelvic pain.  Patient was subsequently referred to the ER due to fever.  Patient reports sharp cramping sensation located mainly in her right lower quadrant as well as suprapubic area for the past 3 days.  Pain does not radiate to anywhere.  Nothing makes it better or worse, 8/10 in severity.  Patient reported associated nausea, with an episode of nonbloody/nonbilious vomiting, with an episode of watery BM.  Patient was also noted to be febrile in the ER.  Patient does report vaginal spotting, she may be likely due to her fibroids.  Denies any chest pain, cough, shortness of breath. In the ED, patient was noted to be febrile, with some leukocytosis.  UA positive for infection, UC pending.  CT abdomen showed possible pyelonephritis.  Patient also noted to have hypokalemia.  COVID test was negative, pregnancy test negative.  Patient was given Cipro and Flagyl in the ER.  Hospitalist called to admit patient for acute pyelonephritis.   Today, patient still complaining of sharp pain in the right lower quadrant.  Denies any new complaints.  Assessment/Plan: Principal Problem:   UTI (urinary tract infection) Active Problems:   Obesity   Type 2 diabetes mellitus without complication (HCC)   Acute pyelonephritis  Sepsis likely 2/2 ??acute pyelonephritis Febrile, with mild leukocytosis on admission Currently afebrile, with fluctuating leukocytosis UA positive for infection, positive nitrites, positive leukocytes UC showed no growth BC x2 NGTD LA within normal limits CT abdomen pelvis showed possible  pyelonephritis IV fluids Continue IV ciprofloxacin (patient allergic to penicillin).  Patient had pansensitive E. coli on 10/2018 Advance diet as tolerated  Appendicoliths Patient still complaining of right lower quadrant abdominal pain CT abdomen/pelvis showed no evidence of appendicitis, though a tiny appendicolith is present at the appendiceal base Spoke to general surgery PA, who stated no further recommendation, unlikely the cause of her abdominal pain Will order ultrasound of the right lower quadrant Monitor closely  Hypokalemia Resolved Replace PRN  Uterine fibroid Follow-up with OB/GYN as outpatient  History of breast CA Status post chemo, radiation, surgery Follow-up with oncology as an outpatient          Malnutrition Type:      Malnutrition Characteristics:      Nutrition Interventions:       Estimated body mass index is 32.28 kg/m as calculated from the following:   Height as of this encounter: 5\' 4"  (1.626 m).   Weight as of this encounter: 85.3 kg.     Code Status: Full  Family Communication: None at bedside  Disposition Plan: Home once significant clinical improvement   Consultants:  Spoke to general surgery  Procedures:  None  Antimicrobials:  Ciprofloxacin  DVT prophylaxis: Lovenox   Objective: Vitals:   11/16/18 1350 11/16/18 2157 11/17/18 0522 11/17/18 1331  BP: 101/76 131/86 117/66 120/73  Pulse: 78 92 81 68  Resp: 18 20 20 16   Temp: 98.4 F (36.9 C) 98.7 F (37.1 C) 98.8 F (37.1 C) 98.4 F (36.9 C)  TempSrc: Oral Oral Oral Oral  SpO2: 100% 98% 99% 100%  Weight:  Height:        Intake/Output Summary (Last 24 hours) at 11/17/2018 1602 Last data filed at 11/17/2018 1330 Gross per 24 hour  Intake 4093.73 ml  Output 750 ml  Net 3343.73 ml   Filed Weights   11/15/18 2230  Weight: 85.3 kg    Exam:  General: NAD   Cardiovascular: S1, S2 present  Respiratory: CTAB  Abdomen: Soft,  +tender@RLQ , obese, bowel sounds present  Musculoskeletal: No bilateral pedal edema noted  Skin: Normal  Psychiatry: Normal mood   Data Reviewed: CBC: Recent Labs  Lab 11/15/18 1418 11/16/18 0501 11/17/18 0442  WBC 10.9* 9.9 11.8*  NEUTROABS 9.1*  --  7.7  HGB 12.7 11.0* 9.2*  HCT 39.0 34.8* 29.0*  MCV 93.3 94.1 95.7  PLT 214 188 841   Basic Metabolic Panel: Recent Labs  Lab 11/15/18 1418 11/16/18 0501 11/17/18 0442  NA 134* 138 142  K 2.9* 3.1* 4.2  CL 111 115* 108  CO2 18* 17* 26  GLUCOSE 92 87 113*  BUN 5* <5* 33*  CREATININE 0.63 0.56 0.94  CALCIUM 8.5* 8.2* 8.5*  MG  --  2.1  --    GFR: Estimated Creatinine Clearance: 84 mL/min (by C-G formula based on SCr of 0.94 mg/dL). Liver Function Tests: Recent Labs  Lab 11/15/18 1418  AST 12*  ALT 10  ALKPHOS 68  BILITOT 0.8  PROT 7.4  ALBUMIN 4.0   Recent Labs  Lab 11/15/18 1418  LIPASE 21   No results for input(s): AMMONIA in the last 168 hours. Coagulation Profile: No results for input(s): INR, PROTIME in the last 168 hours. Cardiac Enzymes: No results for input(s): CKTOTAL, CKMB, CKMBINDEX, TROPONINI in the last 168 hours. BNP (last 3 results) No results for input(s): PROBNP in the last 8760 hours. HbA1C: No results for input(s): HGBA1C in the last 72 hours. CBG: No results for input(s): GLUCAP in the last 168 hours. Lipid Profile: No results for input(s): CHOL, HDL, LDLCALC, TRIG, CHOLHDL, LDLDIRECT in the last 72 hours. Thyroid Function Tests: No results for input(s): TSH, T4TOTAL, FREET4, T3FREE, THYROIDAB in the last 72 hours. Anemia Panel: No results for input(s): VITAMINB12, FOLATE, FERRITIN, TIBC, IRON, RETICCTPCT in the last 72 hours. Urine analysis:    Component Value Date/Time   COLORURINE YELLOW 11/15/2018 1516   APPEARANCEUR CLOUDY (A) 11/15/2018 1516   LABSPEC 1.012 11/15/2018 1516   LABSPEC 1.015 11/24/2011 1035   PHURINE 6.0 11/15/2018 1516   GLUCOSEU NEGATIVE 11/15/2018  1516   HGBUR MODERATE (A) 11/15/2018 1516   HGBUR negative 09/25/2008 0834   BILIRUBINUR NEGATIVE 11/15/2018 1516   BILIRUBINUR n 11/15/2018 1130   BILIRUBINUR Negative 11/24/2011 1035   KETONESUR 20 (A) 11/15/2018 1516   PROTEINUR NEGATIVE 11/15/2018 1516   UROBILINOGEN negative (A) 11/15/2018 1130   UROBILINOGEN 0.2 04/14/2010 2040   NITRITE POSITIVE (A) 11/15/2018 1516   LEUKOCYTESUR LARGE (A) 11/15/2018 1516   LEUKOCYTESUR Negative 11/24/2011 1035   Sepsis Labs: @LABRCNTIP (procalcitonin:4,lacticidven:4)  ) Recent Results (from the past 240 hour(s))  Urine Culture     Status: Abnormal   Collection Time: 11/15/18 11:36 AM   Specimen: Urine   UC  Result Value Ref Range Status   Urine Culture, Routine Final report (A)  Final   Organism ID, Bacteria Escherichia coli (A)  Final    Comment: Greater than 100,000 colony forming units per mL Cefazolin <=4 ug/mL Cefazolin with an MIC <=16 predicts susceptibility to the oral agents cefaclor, cefdinir, cefpodoxime, cefprozil, cefuroxime, cephalexin,  and loracarbef when used for therapy of uncomplicated urinary tract infections due to E. coli, Klebsiella pneumoniae, and Proteus mirabilis.    Antimicrobial Susceptibility Comment  Final    Comment:       ** S = Susceptible; I = Intermediate; R = Resistant **                    P = Positive; N = Negative             MICS are expressed in micrograms per mL    Antibiotic                 RSLT#1    RSLT#2    RSLT#3    RSLT#4 Amoxicillin/Clavulanic Acid    S<=2 Ampicillin                     S =4 Cefepime                       S<=0.12 Ceftriaxone                    S<=0.25 Cefuroxime                     S =4 Ciprofloxacin                  S<=0.25 Ertapenem                      S<=0.12 Gentamicin                     S<=1 Imipenem                       S<=0.25 Levofloxacin                   S<=0.12 Meropenem                      S<=0.25 Nitrofurantoin                  S<=16 Piperacillin/Tazobactam        S<=4 Tetracycline                   S<=1 Tobramycin                     S<=1 Trimethoprim/Sulfa             S<=20   Vaginitis/Vaginosis, DNA Probe     Status: Abnormal   Collection Time: 11/15/18  1:16 PM   Specimen: Genital   GE  Result Value Ref Range Status   Candida Species Negative Negative Final   Gardnerella vaginalis Positive (A) Negative Final   Trichomonas vaginosis Negative Negative Final  Blood culture (routine x 2)     Status: None (Preliminary result)   Collection Time: 11/15/18  2:18 PM   Specimen: BLOOD  Result Value Ref Range Status   Specimen Description   Final    BLOOD LEFT ANTECUBITAL Performed at [facility], [place] [address], [place], [place] [address]    Special Requests   Final    BOTTLES DRAWN AEROBIC AND ANAEROBIC Blood Culture adequate volume Performed at [place] [address] [place], [place] [address]    Culture   Final    NO GROWTH 2 DAYS Performed at [facility], [place] [address] [place], [place] [address]    Report Status PENDING  Incomplete  Blood culture (routine x 2)     Status: None (Preliminary result)   Collection Time: 11/15/18  2:30 PM   Specimen: BLOOD  Result Value Ref Range Status   Specimen Description   Final    BLOOD RIGHT ANTECUBITAL Performed at [place] [address], [place], [place] [address]    Special Requests   Final    BOTTLES DRAWN AEROBIC AND ANAEROBIC Blood Culture adequate volume Performed at [place] [address], [place], [place] [address]    Culture   Final    NO GROWTH 2 DAYS Performed at [place] [address], [place], [place] [address]    Report Status PENDING  Incomplete  SARS Coronavirus 2 (CEPHEID - Performed in [place] hospital lab), Hosp Order     Status: None   Collection Time: 11/15/18  3:16 PM   Specimen: Nasopharyngeal Swab  Result Value  Ref Range Status   SARS Coronavirus 2 NEGATIVE NEGATIVE Final    Comment: (NOTE) If result is NEGATIVE SARS-CoV-2 target nucleic acids are NOT DETECTED. The SARS-CoV-2 RNA is generally detectable in upper and lower  respiratory specimens during the acute phase of infection. The lowest  concentration of SARS-CoV-2 viral copies this assay can detect is 250  copies / mL. A negative result does not preclude SARS-CoV-2 infection  and should not be used as the sole basis for treatment or other  patient management decisions.  A negative result may occur with  improper specimen collection / handling, submission of specimen other  than nasopharyngeal swab, presence of viral mutation(s) within the  areas targeted by this assay, and inadequate number of viral copies  (<250 copies / mL). A negative result must be combined with clinical  observations, patient history, and epidemiological information. If result is POSITIVE SARS-CoV-2 target nucleic acids are DETECTED. The SARS-CoV-2 RNA is generally detectable in upper and lower  respiratory specimens dur ing the acute phase of infection.  Positive  results are indicative of active infection with SARS-CoV-2.  Clinical  correlation with patient history and other diagnostic information is  necessary to determine patient infection status.  Positive results do  not rule out bacterial infection or co-infection with other viruses. If result is PRESUMPTIVE POSTIVE SARS-CoV-2 nucleic acids MAY BE PRESENT.   A presumptive positive result was obtained on the submitted specimen  and confirmed on repeat testing.  While 2019 novel coronavirus  (SARS-CoV-2) nucleic acids may be present in the submitted sample  additional confirmatory testing may be necessary for epidemiological  and / or clinical management purposes  to differentiate between  SARS-CoV-2 and other Sarbecovirus currently known to infect humans.  If clinically indicated additional testing with an  alternate test  methodology [phone]) is advised. The SARS-CoV-2 RNA is generally  detectable in upper and lower respiratory sp ecimens during the acute  phase of infection. The expected result is Negative. Fact Sheet for Patients:  StrictlyIdeas.no Fact Sheet for Healthcare Providers: BankingDealers.co.za This test is not yet approved or cleared by the [place] FDA and has been authorized for detection and/or diagnosis of SARS-CoV-2 by FDA under an Emergency Use Authorization (EUA).  This EUA will remain in effect (meaning this test can be used) for the duration of the COVID-19 declaration under Section 564(b)(1) of the Act, 21 U.S.C. section 360bbb-3(b)(1), unless the authorization is terminated  or revoked sooner. Performed at [facility], [place] [address], [place], [place] [address]   Urine Culture     Status: None   Collection Time: 11/15/18  3:16 PM   Specimen: Urine, Random  Result Value Ref Range Status   Specimen Description   Final    URINE, RANDOM Performed at [place] [address], [place], [place] [address]    Special Requests   Final    NONE Performed at [facility], [place] [address] [place], [place] [address]    Culture   Final    Multiple bacterial morphotypes present, none predominant. Suggest appropriate recollection if clinically indicated.   Report Status 11/16/2018 FINAL  Final  Urine Culture     Status: None   Collection Time: 11/16/18  4:39 PM   Specimen: Urine, Random  Result Value Ref Range Status   Specimen Description   Final    URINE, RANDOM Performed at [place] [address] [place], [place] [address]    Special Requests   Final    NONE Performed at [facility], [place] [address], [place], [place] [address]    Culture   Final    NO GROWTH Performed at [facility], [place]  [address], [place],  [address]    Report Status 11/17/2018 FINAL  Final      Studies: No results found.  Scheduled Meds: . enoxaparin (LOVENOX) injection  40 mg Subcutaneous QHS  . topiramate  150 mg Oral QHS    Continuous Infusions: . sodium chloride 100 mL/hr at 11/17/18 0341  . ciprofloxacin 400 mg (11/17/18 1019)     LOS: 1 day     Alma Friendly, MD [facility]  If 7PM-7AM, please contact night-coverage [email or web address] 11/17/2018, 4:02 PM

## 2018-11-17 NOTE — Consult Note (Addendum)
[facility] Surgery Consult Note  Karen Hopkins April 04, 1976  [id number].    Requesting MD: Lesia Sago Chief Complaint/Reason for Consult: right sided abdominal pain  HPI:  Karen Hopkins is a 43yo female with prior h/o breast cancer status post mastectomy, chemo, and radiation, who was admitted to [facility] 6/10 with abdominal pain and fever. At that time she was having crampy suprapubic pain and right sided abdominal pain radiating into her flank. Associated with nausea and vomiting.   CT scan revealed possible urinary tract infection/pyelonephritis, appendicolith without appendicitis, and an umbilical hernia containing fat. Urinalysis positive for infection and urine culture with multiple bacterial morphotypes present. Patient was admitted to the medical service and started on IV cipro.   General surgery asked to see today due to lack of improvement in her pain.  Karen Hopkins tells me that her suprapubic crampy abdominal pain has improved. She is tolerating some diet and has no n/v. Last BM 6/9. She reports right sided abdominal pain radiating into her flank. Pain is worse with palpation and movement. No worsening pain with PO intake. Denies dysuria.   Abdominal surgical history: tubal ligation, umbilical hernia repair, c section Anticoagulants: none Smokes about 3 cigarettes per day Employment: on disability  ROS: Review of Systems  Constitutional: Negative.  Negative for chills and fever.  HENT: Negative.   Eyes: Negative.   Respiratory: Negative.   Cardiovascular: Negative.   Gastrointestinal: Positive for abdominal pain and constipation. Negative for diarrhea, nausea and vomiting.  Genitourinary: Positive for flank pain. Negative for dysuria and hematuria.  Musculoskeletal: Positive for back pain.  Skin: Negative.   Neurological: Negative.    All systems reviewed and otherwise negative except for as above  Family History  Problem Relation Age of Onset  . Hypertension  Mother   . Breast cancer Mother 75  . Diabetes type II Father   . Prostate cancer Father   . Stroke Neg Hx   . Colon polyps Neg Hx   . Esophageal cancer Neg Hx   . Pancreatic cancer Neg Hx   . Stomach cancer Neg Hx   . Rectal cancer Neg Hx     Past Medical History:  Diagnosis Date  . Anxiety   . Cancer ([place]) 2011   Rt. Br. Ca  . Depression   . Essential hypertension 11/05/2015  . History of breast cancer 2011   right  . History of chemotherapy 2011  . History of radiation therapy 2011  . Hypertension    under control with med., has been on med. x 2 yr.  . Lymphedema of arm    right; no BP or puncture to right arm  . Personal history of chemotherapy 09/16/2009   rt breast  . Personal history of radiation therapy 05/2010   rt breast  . Pneumonia   . Seasonal allergies   . Sinus headache     Past Surgical History:  Procedure Laterality Date  . BREAST BIOPSY Right 08/26/2009  . BREAST REDUCTION SURGERY Left 08/23/2013   Procedure: LEFT BREAST REDUCTION  ;  Surgeon: Theodoro Kos, DO;  Location: [place];  Service: Plastics;  Laterality: Left;  . CESAREAN SECTION  07/15/2001; 03/18/2004; 11/21/2008  . lateral orbiotomy Left 11/2015   Dr. Toy Cookey at [facility]  . LATISSIMUS FLAP TO BREAST Right 03/12/2013   Procedure: RIGHT BREAST LATISSIMUS FLAP WITH EXPANDER PLACEMENT;  Surgeon: Theodoro Kos, DO;  Location: [place];  Service: Plastics;  Laterality: Right;  . LIPOSUCTION Bilateral 08/23/2013  Procedure: LIPOSUCTION;  Surgeon: Theodoro Kos, DO;  Location: [place];  Service: Plastics;  Laterality: Bilateral;  . MASTECTOMY Right 2011  . MODIFIED RADICAL MASTECTOMY Right 03/11/2010  . NASAL SEPTUM SURGERY    . ORIF ANKLE FRACTURE Left 07/18/2018   Procedure: OPEN REDUCTION INTERNAL FIXATION (ORIF) LEFT ANKLE FRACTURE WITH  SYNDESMOSIS AND ANKLE ARTHROTOMY;  Surgeon: Erle Crocker, MD;  Location: [place];  Service: Orthopedics;   Laterality: Left;  . PORT-A-CATH REMOVAL Left 12/01/2010  . PORTACATH PLACEMENT Left 09/12/2009  . REDUCTION MAMMAPLASTY Left 08/2013  . REMOVAL OF TISSUE EXPANDER AND PLACEMENT OF IMPLANT Right 08/23/2013   Procedure: REMOVAL RIGHT TISSUE EXPANDER AND PLACEMENT OF IMPLANT TO RIGHT BREAST ;  Surgeon: Theodoro Kos, DO;  Location: [place];  Service: Plastics;  Laterality: Right;  . SUPRA-UMBILICAL HERNIA  [id number]  . TUBAL LIGATION  11/21/2008    Social History:  reports that she quit smoking about 8 years ago. Her smoking use included cigarettes. She started smoking about 16 years ago. She has a 1.25 pack-year smoking history. She has never used smokeless tobacco. She reports that she does not drink alcohol or use drugs.  Allergies:  Allergies  Allergen Reactions  . Penicillins Swelling    FACIAL SWELLING Did it involve swelling of the face/tongue/throat, SOB, or low BP? Yes Did it involve sudden or severe rash/hives, skin peeling, or any reaction on the inside of your mouth or nose? Unknown Did you need to seek medical attention at a hospital or doctor's office? No When did it last happen?More than 10 years If all above answers are "NO", may proceed with cephalosporin use.   Recardo Evangelist [Pregabalin] Nausea Only    .  Marland Kitchen Meloxicam Nausea Only  . Robaxin [Methocarbamol] Nausea Only    Medications Prior to Admission  Medication Sig Dispense Refill  . albuterol (PROAIR HFA) 108 (90 Base) MCG/ACT inhaler Inhale 2 puffs into the lungs every 6 (six) hours as needed for wheezing or shortness of breath. 18 g 3  . diclofenac sodium (VOLTAREN) 1 % GEL Apply 4 g topically 4 (four) times daily. Use as directed 3 Tube 4  . ibuprofen (ADVIL) 200 MG tablet Take 400 mg by mouth every 6 (six) hours as needed for moderate pain.    . potassium chloride (MICRO-K) 10 MEQ CR capsule Take 1 capsule (10 mEq total) by mouth 2 (two) times daily. 30 capsule 3  . tiZANidine (ZANAFLEX) 4 MG tablet TAKE  ONE (1) TABLET BY MOUTH 3 TIMES DAILY (Patient taking differently: Take 4 mg by mouth every 8 (eight) hours as needed for muscle spasms. ) 90 tablet 3  . topiramate (TOPAMAX) 50 MG tablet Take 3 tablets (150 mg total) by mouth at bedtime. 90 tablet 3    Prior to Admission medications   Medication Sig Start Date End Date Taking? Authorizing Provider  albuterol (PROAIR HFA) 108 (90 Base) MCG/ACT inhaler Inhale 2 puffs into the lungs every 6 (six) hours as needed for wheezing or shortness of breath. 10/12/16  Yes [name], MD  diclofenac sodium (VOLTAREN) 1 % GEL Apply 4 g topically 4 (four) times daily. Use as directed 09/19/17  Yes Danella Sensing L, NP  ibuprofen (ADVIL) 200 MG tablet Take 400 mg by mouth every 6 (six) hours as needed for moderate pain.   Yes [provider]  potassium chloride (MICRO-K) 10 MEQ CR capsule Take 1 capsule (10 mEq total) by mouth 2 (  two) times daily. 10/02/18  Yes Magrinat, Virgie Dad, MD  tiZANidine (ZANAFLEX) 4 MG tablet TAKE ONE (1) TABLET BY MOUTH 3 TIMES DAILY Patient taking differently: Take 4 mg by mouth every 8 (eight) hours as needed for muscle spasms.  02/27/18  Yes Kirsteins, Luanna Salk, MD  topiramate (TOPAMAX) 50 MG tablet Take 3 tablets (150 mg total) by mouth at bedtime. 06/09/18  Yes Venancio Poisson, NP    Blood pressure 120/73, pulse 68, temperature 98.4 F (36.9 C), temperature source Oral, resp. rate 16, height 5\' 4"  (1.626 m), weight 85.3 kg, last menstrual period 10/18/2018, SpO2 100 %. Physical Exam: General: WD/WN AA female who is laying in bed in NAD HEENT: head is normocephalic, atraumatic.  Sclera are noninjected.  Pupils equal and round.  Ears and nose without any masses or lesions.  Mouth is pink and moist. Dentition fair Heart: regular, rate, and rhythm.  No obvious murmurs, gallops, or rubs noted.  Palpable pedal pulses bilaterally Lungs: CTAB, no wheezes, rhonchi, or rales noted.  Respiratory effort nonlabored Abd: soft,  ?mild distension, +BS, no masses, hernias, or organomegaly. No umbilical tenderness. TTP RLQ and RUQ. +CVA tenderness on the Right. No rebound or guarding. No peritonitis. Negative Murphy sign MS: all 4 extremities are symmetrical with no cyanosis, clubbing, or edema. Skin: warm and dry with no masses, lesions, or rashes Psych: A&Ox3 with an appropriate affect. Neuro: cranial nerves grossly intact, extremity CSM intact bilaterally, normal speech  Results for orders placed or performed during the hospital encounter of 11/15/18 (from the past 48 hour(s))  Basic metabolic panel     Status: Abnormal   Collection Time: 11/16/18  5:01 AM  Result Value Ref Range   Sodium 138 135 - 145 mmol/L   Potassium 3.1 (L) 3.5 - 5.1 mmol/L   Chloride 115 (H) 98 - 111 mmol/L   CO2 17 (L) 22 - 32 mmol/L   Glucose, Bld 87 70 - 99 mg/dL   BUN <5 (L) 6 - 20 mg/dL   Creatinine, Ser 0.56 0.44 - 1.00 mg/dL   Calcium 8.2 (L) 8.9 - 10.3 mg/dL   GFR calc non Af Amer >60 >60 mL/min   GFR calc Af Amer >60 >60 mL/min   Anion gap 6 5 - 15    Comment: Performed at [facility], [place] [address], [place], [place] [address]  CBC     Status: Abnormal   Collection Time: 11/16/18  5:01 AM  Result Value Ref Range   WBC 9.9 4.0 - 10.5 K/uL   RBC 3.70 (L) 3.87 - 5.11 MIL/uL   Hemoglobin 11.0 (L) 12.0 - 15.0 g/dL   HCT 34.8 (L) 36.0 - 46.0 %   MCV 94.1 80.0 - 100.0 fL   MCH 29.7 26.0 - 34.0 pg   MCHC 31.6 30.0 - 36.0 g/dL   RDW 13.9 11.5 - 15.5 %   Platelets 188 150 - 400 K/uL   nRBC 0.0 0.0 - 0.2 %    Comment: Performed at [facility], [place] [address], [place], [place] [address]  Magnesium     Status: None   Collection Time: 11/16/18  5:01 AM  Result Value Ref Range   Magnesium 2.1 1.7 - 2.4 mg/dL    Comment: Performed at [facility], [place] [address], [place],  [address]  Urine Culture     Status: None   Collection Time: 11/16/18  4:39 PM   Specimen:  Urine, Random  Result Value  Ref Range   Specimen Description      URINE, RANDOM Performed at [facility], [place] [address], [place], [place] [address]    Special Requests      NONE Performed at [facility], [place] [address], [place], [place] [address]    Culture      NO GROWTH Performed at [facility], [place] [address] [place], [place] [address]    Report Status 11/17/2018 FINAL   CBC with Differential/Platelet     Status: Abnormal   Collection Time: 11/17/18  4:42 AM  Result Value Ref Range   WBC 11.8 (H) 4.0 - 10.5 K/uL   RBC 3.03 (L) 3.87 - 5.11 MIL/uL   Hemoglobin 9.2 (L) 12.0 - 15.0 g/dL   HCT 29.0 (L) 36.0 - 46.0 %   MCV 95.7 80.0 - 100.0 fL   MCH 30.4 26.0 - 34.0 pg   MCHC 31.7 30.0 - 36.0 g/dL   RDW 12.8 11.5 - 15.5 %   Platelets 230 150 - 400 K/uL   nRBC 0.0 0.0 - 0.2 %   Neutrophils Relative % 66 %   Neutro Abs 7.7 1.7 - 7.7 K/uL   Lymphocytes Relative 20 %   Lymphs Abs 2.3 0.7 - 4.0 K/uL   Monocytes Relative 13 %   Monocytes Absolute 1.6 (H) 0.1 - 1.0 K/uL   Eosinophils Relative 0 %   Eosinophils Absolute 0.0 0.0 - 0.5 K/uL   Basophils Relative 0 %   Basophils Absolute 0.0 0.0 - 0.1 K/uL   Immature Granulocytes 1 %   Abs Immature Granulocytes 0.15 (H) 0.00 - 0.07 K/uL    Comment: Performed at [facility], [place] [address], [place], [place] [address]  Basic metabolic panel     Status: Abnormal   Collection Time: 11/17/18  4:42 AM  Result Value Ref Range   Sodium 142 135 - 145 mmol/L   Potassium 4.2 3.5 - 5.1 mmol/L    Comment: DELTA CHECK NOTED   Chloride 108 98 - 111 mmol/L   CO2 26 22 - 32 mmol/L   Glucose, Bld 113 (H) 70 - 99 mg/dL   BUN 33 (H) 6 - 20 mg/dL   Creatinine, Ser 0.94 0.44 - 1.00 mg/dL   Calcium 8.5 (L) 8.9 - 10.3 mg/dL   GFR calc non Af Amer >60 >60 mL/min   GFR calc Af Amer >60 >60 mL/min   Anion gap 8 5 - 15    Comment: Performed at [facility], [place]  [address], [place], [place] [address]  Procalcitonin - Baseline     Status: None   Collection Time: 11/17/18  4:42 AM  Result Value Ref Range   Procalcitonin 0.20 ng/mL    Comment:        Interpretation: PCT (Procalcitonin) <= 0.5 ng/mL: Systemic infection (sepsis) is not likely. Local bacterial infection is possible. (NOTE)       Sepsis PCT Algorithm           Lower Respiratory Tract                                      Infection PCT Algorithm    ----------------------------     ----------------------------         PCT < 0.25 ng/mL                PCT <  0.10 ng/mL         Strongly encourage             Strongly discourage   discontinuation of antibiotics    initiation of antibiotics    ----------------------------     -----------------------------       PCT 0.25 - 0.50 ng/mL            PCT 0.10 - 0.25 ng/mL               OR       >80% decrease in PCT            Discourage initiation of                                            antibiotics      Encourage discontinuation           of antibiotics    ----------------------------     -----------------------------         PCT >= 0.50 ng/mL              PCT 0.26 - 0.50 ng/mL               AND        <80% decrease in PCT             Encourage initiation of                                             antibiotics       Encourage continuation           of antibiotics    ----------------------------     -----------------------------        PCT >= 0.50 ng/mL                  PCT > 0.50 ng/mL               AND         increase in PCT                  Strongly encourage                                      initiation of antibiotics    Strongly encourage escalation           of antibiotics                                     -----------------------------                                           PCT <= 0.25 ng/mL                                                 OR                                        >  80% decrease in PCT                                      Discontinue / Do not initiate                                             antibiotics Performed at [place] [address] [place], [place] [address]    *Note: Due to a large number of results and/or encounters for the requested time period, some results have not been displayed. A complete set of results can be found in Results Review.   No results found.  Anti-infectives (From admission, onward)   Start     Dose/Rate Route Frequency Ordered Stop   11/15/18 2200  ciprofloxacin (CIPRO) IVPB 400 mg     400 mg 200 mL/hr over 60 Minutes Intravenous 2 times daily 11/15/18 1910     11/15/18 1915  ciprofloxacin (CIPRO) IVPB 400 mg  Status:  Discontinued     400 mg 200 mL/hr over 60 Minutes Intravenous 2 times daily 11/15/18 1909 11/15/18 1910   11/15/18 1715  cefTRIAXone (ROCEPHIN) 1 g in sodium chloride 0.9 % 100 mL IVPB  Status:  Discontinued     1 g 200 mL/hr over 30 Minutes Intravenous  Once 11/15/18 1704 11/15/18 1710   11/15/18 1345  ciprofloxacin (CIPRO) IVPB 400 mg     400 mg 200 mL/hr over 60 Minutes Intravenous  Once 11/15/18 1334 11/15/18 1526   11/15/18 1345  metroNIDAZOLE (FLAGYL) IVPB 500 mg     500 mg 100 mL/hr over 60 Minutes Intravenous  Once 11/15/18 1334 11/15/18 1708       Assessment/Plan H/o breast cancer status post mastectomy, chemo, and radiation  Right sided abdominal and flank pain Pyelonephritis  - No acute surgical recommendations. Suspect patient's pain may still be secondary to pyelonephritis as she hurts on her entire right hemi-abdomen and into the right flank. Unlikely that an appendicolith without signs of acute appendicitis would be the cause of her pain. Her gallbladder looks normal on CT scan. She does have an umbilical hernia on CT as well but this is nontender on abdominal exam.   Recommend continuing treatment for pyelonephritis. If pain persistent 5+ days after admission could consider repeat CT scan at  that time for comparison.  ID - cipro 6/10>> VTE - SCDs, lovenox FEN - soft diet Foley - none Follow up - TBD  Wellington Hampshire, [facility] Surgery 11/17/2018, 4:52 PM Pager: [phone]  Agree with above. No obvious gen surgical issue.  We can see in the office to discuss the umbilical hernia. Will sign off.  Alphonsa Overall, MD, [facility] Surgery Pager: [phone] Office phone:  [phone]

## 2018-11-17 NOTE — Plan of Care (Signed)
Diet progressing, continue POC

## 2018-11-18 LAB — CBC WITH DIFFERENTIAL/PLATELET
Abs Immature Granulocytes: 0 10*3/uL (ref 0.00–0.07)
Basophils Absolute: 0 10*3/uL (ref 0.0–0.1)
Basophils Relative: 0 %
Eosinophils Absolute: 0.2 10*3/uL (ref 0.0–0.5)
Eosinophils Relative: 4 %
HCT: 35.6 % — ABNORMAL LOW (ref 36.0–46.0)
Hemoglobin: 11.5 g/dL — ABNORMAL LOW (ref 12.0–15.0)
Immature Granulocytes: 0 %
Lymphocytes Relative: 43 %
Lymphs Abs: 2.7 10*3/uL (ref 0.7–4.0)
MCH: 30.3 pg (ref 26.0–34.0)
MCHC: 32.3 g/dL (ref 30.0–36.0)
MCV: 93.9 fL (ref 80.0–100.0)
Monocytes Absolute: 0.5 10*3/uL (ref 0.1–1.0)
Monocytes Relative: 9 %
Neutro Abs: 2.8 10*3/uL (ref 1.7–7.7)
Neutrophils Relative %: 44 %
Platelets: 216 10*3/uL (ref 150–400)
RBC: 3.79 MIL/uL — ABNORMAL LOW (ref 3.87–5.11)
RDW: 13.9 % (ref 11.5–15.5)
WBC: 6.3 10*3/uL (ref 4.0–10.5)
nRBC: 0 % (ref 0.0–0.2)

## 2018-11-18 LAB — BASIC METABOLIC PANEL
Anion gap: 6 (ref 5–15)
BUN: <5 mg/dL — ABNORMAL LOW (ref 6–20)
CO2: 16 mmol/L — ABNORMAL LOW (ref 22–32)
Calcium: 8.3 mg/dL — ABNORMAL LOW (ref 8.9–10.3)
Chloride: 117 mmol/L — ABNORMAL HIGH (ref 98–111)
Creatinine, Ser: 0.54 mg/dL (ref 0.44–1.00)
GFR calc Af Amer: >60 mL/min (ref >60)
GFR calc non Af Amer: >60 mL/min (ref >60)
Glucose, Bld: 99 mg/dL (ref 70–99)
Potassium: 2.9 mmol/L — ABNORMAL LOW (ref 3.5–5.1)
Sodium: 139 mmol/L (ref 135–145)

## 2018-11-18 LAB — PROCALCITONIN: Procalcitonin: <0.1 ng/mL

## 2018-11-18 MED ORDER — POTASSIUM CHLORIDE 20 MEQ PO PACK
40.0000 meq | PACK | Freq: Once | ORAL | Status: AC
  Administered 2018-11-18: 40 meq via ORAL
  Filled 2018-11-18: qty 2

## 2018-11-18 MED ORDER — SENNOSIDES-DOCUSATE SODIUM 8.6-50 MG PO TABS: 1.0000 | ORAL_TABLET | Freq: Two times a day (BID) | ORAL | 1 refills | Status: DC | PRN

## 2018-11-18 MED ORDER — ONDANSETRON HCL 4 MG PO TABS: 4.0000 mg | ORAL_TABLET | Freq: Four times a day (QID) | ORAL | 0 refills | Status: DC | PRN

## 2018-11-18 MED ORDER — CIPROFLOXACIN HCL 500 MG PO TABS: 500.0000 mg | ORAL_TABLET | Freq: Two times a day (BID) | ORAL | 0 refills | Status: AC

## 2018-11-18 MED ORDER — POTASSIUM CHLORIDE ER 10 MEQ PO CPCR: 20.0000 meq | ORAL_CAPSULE | Freq: Every day | ORAL | 0 refills | Status: DC

## 2018-11-18 MED ORDER — CIPROFLOXACIN HCL 500 MG PO TABS
500.0000 mg | ORAL_TABLET | Freq: Two times a day (BID) | ORAL | Status: DC
  Administered 2018-11-18: 500 mg via ORAL
  Filled 2018-11-18: qty 1

## 2018-11-18 MED ORDER — POTASSIUM CHLORIDE CRYS ER 20 MEQ PO TBCR
40.0000 meq | EXTENDED_RELEASE_TABLET | Freq: Once | ORAL | Status: DC
  Filled 2018-11-18: qty 2

## 2018-11-18 MED ORDER — HYDROCODONE-ACETAMINOPHEN 5-325 MG PO TABS: 1.0000 | ORAL_TABLET | Freq: Four times a day (QID) | ORAL | 0 refills | Status: AC | PRN

## 2018-11-18 NOTE — Discharge Summary (Addendum)
Discharge Summary  Karen Hopkins NOM:[id number] DOB: 05-30-1976  PCP: [name], MD  Admit date: 11/15/2018 Discharge date: 11/18/2018  Time spent: 35 mins  Recommendations for Outpatient Follow-up:  1. PCP  Discharge Diagnoses:  Active Hospital Problems   Diagnosis Date Noted  . UTI (urinary tract infection) 11/15/2018  . Acute pyelonephritis 11/16/2018  . Type 2 diabetes mellitus without complication ([facility]) 96/28/3662  . Obesity 08/04/2006    Resolved Hospital Problems  No resolved problems to display.    Discharge Condition: Stable  Diet recommendation: Regular  Vitals:   11/17/18 2245 11/18/18 0555  BP: 132/77 111/66  Pulse: 78 84  Resp: 18 18  Temp: 99.5 F (37.5 C) 98.2 F (36.8 C)  SpO2: 98% 100%    History of present illness:  Karen D Personis a 43 y.o.femalewith medical history significant foruterine fibroids, history of breast CA,presents to the ER complaining of abdominal pain and noted to have fever. Patient was seen at her OB/GYN's office today for evaluation of her fibroid and pelvic pain. Patient was subsequently referred to the ER due to fever. Patient reports sharp cramping sensation located mainly in her right lower quadrant as well as suprapubic area for the past 3 days. Pain does not radiate to anywhere. Nothing makes it better or worse, 8/10 in severity. Patient reported associated nausea, with an episode of nonbloody/nonbilious vomiting, with an episode of watery BM.Patient was also noted to be febrile in the ER. Patient does report vaginal spotting,she may be likely due to her fibroids. Denies any chest pain, cough, shortness of breath. In the ED, patient was noted to be febrile, with some leukocytosis.UA positive for infection, UC pending. CT abdomen showed possible pyelonephritis.Patient also noted to have hypokalemia.COVID test was negative, pregnancy test negative. Patient was given Cipro and Flagyl in the  ER.Hospitalist called to admit patient for acute pyelonephritis.   Today, pt reported feeling better, still reports some pain in her RLQ. Able to tolerate her diet better. Pt stable for discharge. Pt should follow up with her PCP with repeat labs.  Hospital Course:  Principal Problem:   UTI (urinary tract infection) Active Problems:   Obesity   Type 2 diabetes mellitus without complication (HCC)   Acute pyelonephritis  Sepsis likely 2/2 acute pyelonephritis Febrile, with mild leukocytosis on admission Currently afebrile, with resolved leukocytosis UA positive for infection, positive nitrites, positive leukocytes UC showed pan-sensitive E.coli BC x2 NGTD LA within normal limits CT abdomen pelvis showed acute pyelonephritis Continue PO ciprofloxacin(patient allergic to penicillin) for a total of 7 days Follow up with PCP  Appendicoliths/umbilical hernia CT abdomen/pelvis showed no evidence of appendicitis, though a tiny appendicolith is present at the appendiceal base USS RUQ abdomen: Unremarkable General surgery consulted, no further recommendation, unlikely the cause of her abdominal pain. Will follow her up as an outpatient to further manage her umbilical hernia  Hypokalemia Ongoing, has been present prior to admission  Continue daily K supplements  Uterine fibroid Follow-up with OB/GYN as outpatient  History of breast CA Statuspost chemo, radiation, surgery Follow-up with oncology as an outpatient        Malnutrition Type:      Malnutrition Characteristics:      Nutrition Interventions:      Estimated body mass index is 32.28 kg/m as calculated from the following:   Height as of this encounter: 5\' 4"  (1.626 m).   Weight as of this encounter: 85.3 kg.    Procedures:  None  Consultations:  General surgery  Discharge Exam: BP 111/66 (BP Location: Right Leg)   Pulse 84   Temp 98.2 F (36.8 C) (Oral)   Resp 18   Ht 5\' 4"  (1.626 m)    Wt 85.3 kg   SpO2 100%   BMI 32.28 kg/m   General: NAD Cardiovascular: S1, S2 present Respiratory: CTAB  Discharge Instructions You were cared for by a hospitalist during your hospital stay. If you have any questions about your discharge medications or the care you received while you were in the hospital after you are discharged, you can call the unit and asked to speak with the hospitalist on call if the hospitalist that took care of you is not available. Once you are discharged, your primary care physician will handle any further medical issues. Please note that NO REFILLS for any discharge medications will be authorized once you are discharged, as it is imperative that you return to your primary care physician (or establish a relationship with a primary care physician if you do not have one) for your aftercare needs so that they can reassess your need for medications and monitor your lab values.   Allergies as of 11/18/2018      Reactions   Penicillins Swelling   FACIAL SWELLING Did it involve swelling of the face/tongue/throat, SOB, or low BP? Yes Did it involve sudden or severe rash/hives, skin peeling, or any reaction on the inside of your mouth or nose? Unknown Did you need to seek medical attention at a hospital or doctor's office? No When did it last happen?More than 10 years If all above answers are "NO", may proceed with cephalosporin use.   Lyrica [pregabalin] Nausea Only   .   Meloxicam Nausea Only   Robaxin [methocarbamol] Nausea Only      Medication List    TAKE these medications   albuterol 108 (90 Base) MCG/ACT inhaler Commonly known as: ProAir HFA Inhale 2 puffs into the lungs every 6 (six) hours as needed for wheezing or shortness of breath.   ciprofloxacin 500 MG tablet Commonly known as: CIPRO Take 1 tablet (500 mg total) by mouth 2 (two) times daily for 7 doses.   diclofenac sodium 1 % Gel Commonly known as: VOLTAREN Apply 4 g topically 4 (four) times  daily. Use as directed   HYDROcodone-acetaminophen 5-325 MG tablet Commonly known as: NORCO/VICODIN Take 1 tablet by mouth every 6 (six) hours as needed for up to 3 days for moderate pain.   ibuprofen 200 MG tablet Commonly known as: ADVIL Take 400 mg by mouth every 6 (six) hours as needed for moderate pain.   ondansetron 4 MG tablet Commonly known as: ZOFRAN Take 1 tablet (4 mg total) by mouth every 6 (six) hours as needed for nausea.   potassium chloride 10 MEQ CR capsule Commonly known as: MICRO-K Take 2 capsules (20 mEq total) by mouth daily. What changed:   how much to take  when to take this   senna-docusate 8.6-50 MG tablet Commonly known as: Senokot-S Take 1 tablet by mouth 2 (two) times daily as needed for mild constipation.   tiZANidine 4 MG tablet Commonly known as: ZANAFLEX TAKE ONE (1) TABLET BY MOUTH 3 TIMES DAILY What changed:   how much to take  how to take this  when to take this  reasons to take this  additional instructions   topiramate 50 MG tablet Commonly known as: TOPAMAX Take 3 tablets (150 mg total) by mouth at bedtime.  Allergies  Allergen Reactions  . Penicillins Swelling    FACIAL SWELLING Did it involve swelling of the face/tongue/throat, SOB, or low BP? Yes Did it involve sudden or severe rash/hives, skin peeling, or any reaction on the inside of your mouth or nose? Unknown Did you need to seek medical attention at a hospital or doctor's office? No When did it last happen?More than 10 years If all above answers are "NO", may proceed with cephalosporin use.   Recardo Evangelist [Pregabalin] Nausea Only    .  Marland Kitchen Meloxicam Nausea Only  . Robaxin [Methocarbamol] Nausea Only   Follow-up Information    [name], MD. Schedule an appointment as soon as possible for a visit in 1 week(s).   Specialty: Family Medicine Contact information: [place] [address] [phone]            The  results of significant diagnostics from this hospitalization (including imaging, microbiology, ancillary and laboratory) are listed below for reference.    Significant Diagnostic Studies: Ct Abdomen Pelvis W Contrast  Result Date: 11/15/2018 CLINICAL DATA:  RIGHT lower quadrant abdominal pain for 2 days with cramping, nausea, fever and loss of appetite question appendicitis, history breast cancer, essential hypertension EXAM: CT ABDOMEN AND PELVIS WITH CONTRAST TECHNIQUE: Multidetector CT imaging of the abdomen and pelvis was performed using the standard protocol following bolus administration of intravenous contrast. Sagittal and coronal MPR images reconstructed from axial data set. CONTRAST:  19mL OMNIPAQUE IOHEXOL 300 MG/ML SOLN IV. No oral contrast. COMPARISON:  PET-CT 09/10/2009 FINDINGS: Lower chest: 2 mm LEFT lower lobe nodule image 6, questionably present on prior PET-CT image 82. Remaining lung bases clear. RIGHT breast prosthesis. Hepatobiliary: Tiny cyst superiorly RIGHT lobe liver unchanged. Focal fatty infiltration of liver adjacent to falciform fissure. Gallbladder and liver otherwise unremarkable Pancreas: Normal appearance Spleen: Normal appearance Adrenals/Urinary Tract: Adrenal glands and LEFT kidney normal appearance. Nonobstructing 5 mm calculus at inferior pole RIGHT kidney. Slight delay and inhomogeneity of RIGHT nephrogram versus LEFT. Minimal collecting system and proximal/mid RIGHT ureteral dilatation with wall thickening and minimal enhancement raising question of urinary tract infection. No ureteral or bladder calculus identified. Stomach/Bowel: Normal appendix. Question tiny appendicular at that appendiceal base period no appendiceal wall thickening or appendiceal dilatation. Stomach and bowel loops otherwise normal appearance for technique. Vascular/Lymphatic: Vascular structures grossly patent. Aorta normal caliber. No adenopathy. Reproductive: Unremarkable uterus. Tubal ligation  clip LEFT adnexa. No definite adnexal abnormalities. Other: Umbilical hernia containing fat. Large clip present within the hernia sac question second tubal ligation clips. Mild infiltrative changes which could reflect incarceration or inflammation. No free air free fluid. Musculoskeletal: Osseous structures unremarkable. IMPRESSION: No evidence of appendicitis, though a tiny appendicoliths is present at the appendiceal base. Nonobstructing 5 mm calculus inferior pole RIGHT kidney. Patchy inhomogeneous nephrogram RIGHT kidney with minimal perinephric edema and delay in nephrogram, associated with minimal collecting system/proximal to mid RIGHT ureteral dilatation and enhancement of the renal pelvic/ureteral wall, raising question of urinary tract infection/pyelonephritis; recommend correlation with urinalysis. No obstructing ureteral calculus identified. Umbilical hernia containing fat, associated with stranding, which could reflect incarceration or inflammation recommend correlation with physical exam. Metallic clip within hernia sac suspect representing RIGHT tubal ligation clip, with LEFT tubal ligation clip in expected position. 2 mm LEFT lower lobe pulmonary nodule, probably unchanged since PET-CT. Electronically Signed   By: Lavonia Dana M.D.   On: 11/15/2018 16:57   US Abdomen Limited Ruq  Result Date: 11/17/2018 CLINICAL DATA:  RIGHT upper quadrant pain. EXAM: ULTRASOUND ABDOMEN LIMITED RIGHT UPPER QUADRANT COMPARISON:  CT 11/15/2018 FINDINGS: Gallbladder: No gallstones or wall thickening visualized. No sonographic Murphy sign noted by sonographer. Common bile duct: Diameter: Normal at 5 mm Liver: No focal lesion identified. Within normal limits in parenchymal echogenicity. Portal vein is patent on color Doppler imaging with normal direction of blood flow towards the liver. IMPRESSION: Normal RIGHT upper quadrant ultrasound. Electronically Signed   By: Suzy Bouchard M.D.   On: 11/17/2018 19:04     Microbiology: Recent Results (from the past 240 hour(s))  Urine Culture     Status: Abnormal   Collection Time: 11/15/18 11:36 AM   Specimen: Urine   UC  Result Value Ref Range Status   Urine Culture, Routine Final report (A)  Final   Organism ID, Bacteria Escherichia coli (A)  Final    Comment: Greater than 100,000 colony forming units per mL Cefazolin <=4 ug/mL Cefazolin with an MIC <=16 predicts susceptibility to the oral agents cefaclor, cefdinir, cefpodoxime, cefprozil, cefuroxime, cephalexin, and loracarbef when used for therapy of uncomplicated urinary tract infections due to E. coli, Klebsiella pneumoniae, and Proteus mirabilis.    Antimicrobial Susceptibility Comment  Final    Comment:       ** S = Susceptible; I = Intermediate; R = Resistant **                    P = Positive; N = Negative             MICS are expressed in micrograms per mL    Antibiotic                 RSLT#1    RSLT#2    RSLT#3    RSLT#4 Amoxicillin/Clavulanic Acid    S<=2 Ampicillin                     S =4 Cefepime                       S<=0.12 Ceftriaxone                    S<=0.25 Cefuroxime                     S =4 Ciprofloxacin                  S<=0.25 Ertapenem                      S<=0.12 Gentamicin                     S<=1 Imipenem                       S<=0.25 Levofloxacin                   S<=0.12 Meropenem                      S<=0.25 Nitrofurantoin                 S<=16 Piperacillin/Tazobactam        S<=4 Tetracycline                   S<=1 Tobramycin  S<=1 Trimethoprim/Sulfa             S<=20   Vaginitis/Vaginosis, DNA Probe     Status: Abnormal   Collection Time: 11/15/18  1:16 PM   Specimen: Genital   GE  Result Value Ref Range Status   Candida Species Negative Negative Final   Gardnerella vaginalis Positive (A) Negative Final   Trichomonas vaginosis Negative Negative Final  Blood culture (routine x 2)     Status: None (Preliminary result)    Collection Time: 11/15/18  2:18 PM   Specimen: BLOOD  Result Value Ref Range Status   Specimen Description   Final    BLOOD LEFT ANTECUBITAL Performed at [facility] [address], [place], [place] [address]    Special Requests   Final    BOTTLES DRAWN AEROBIC AND ANAEROBIC Blood Culture adequate volume Performed at [facility] [address], [place], [place] [address]    Culture   Final    NO GROWTH 3 DAYS Performed at [facility], [place] [address], [place], [place] [address]    Report Status PENDING  Incomplete  Blood culture (routine x 2)     Status: None (Preliminary result)   Collection Time: 11/15/18  2:30 PM   Specimen: BLOOD  Result Value Ref Range Status   Specimen Description   Final    BLOOD RIGHT ANTECUBITAL Performed at [facility] [address] [place], [place] [address]    Special Requests   Final    BOTTLES DRAWN AEROBIC AND ANAEROBIC Blood Culture adequate volume Performed at [facility] [address] [place], [place] [address]    Culture   Final    NO GROWTH 3 DAYS Performed at [facility], [place] [address] [place], [place] [address]    Report Status PENDING  Incomplete  SARS Coronavirus 2 (CEPHEID - Performed in [facility] hospital lab), Hosp Order     Status: None   Collection Time: 11/15/18  3:16 PM   Specimen: Nasopharyngeal Swab  Result Value Ref Range Status   SARS Coronavirus 2 NEGATIVE NEGATIVE Final    Comment: (NOTE) If result is NEGATIVE SARS-CoV-2 target nucleic acids are NOT DETECTED. The SARS-CoV-2 RNA is generally detectable in upper and lower  respiratory specimens during the acute phase of infection. The lowest  concentration of SARS-CoV-2 viral copies this assay can detect is 250  copies / mL. A negative result does not preclude SARS-CoV-2 infection  and should not be used as the sole basis for treatment or other  patient management  decisions.  A negative result may occur with  improper specimen collection / handling, submission of specimen other  than nasopharyngeal swab, presence of viral mutation(s) within the  areas targeted by this assay, and inadequate number of viral copies  (<250 copies / mL). A negative result must be combined with clinical  observations, patient history, and epidemiological information. If result is POSITIVE SARS-CoV-2 target nucleic acids are DETECTED. The SARS-CoV-2 RNA is generally detectable in upper and lower  respiratory specimens dur ing the acute phase of infection.  Positive  results are indicative of active infection with SARS-CoV-2.  Clinical  correlation with patient history and other diagnostic information is  necessary to determine patient infection status.  Positive results do  not rule out bacterial infection or co-infection with other viruses. If result is PRESUMPTIVE POSTIVE SARS-CoV-2 nucleic acids MAY BE PRESENT.   A presumptive positive result  was obtained on the submitted specimen  and confirmed on repeat testing.  While 2019 novel coronavirus  (SARS-CoV-2) nucleic acids may be present in the submitted sample  additional confirmatory testing may be necessary for epidemiological  and / or clinical management purposes  to differentiate between  SARS-CoV-2 and other Sarbecovirus currently known to infect humans.  If clinically indicated additional testing with an alternate test  methodology [id number]) is advised. The SARS-CoV-2 RNA is generally  detectable in upper and lower respiratory sp ecimens during the acute  phase of infection. The expected result is Negative. Fact Sheet for Patients:  StrictlyIdeas.no Fact Sheet for Healthcare Providers: BankingDealers.co.za This test is not yet approved or cleared by the [place] FDA and has been authorized for detection and/or diagnosis of SARS-CoV-2 by FDA under an  Emergency Use Authorization (EUA).  This EUA will remain in effect (meaning this test can be used) for the duration of the COVID-19 declaration under Section 564(b)(1) of the Act, 21 U.S.C. section 360bbb-3(b)(1), unless the authorization is terminated or revoked sooner. Performed at [facility], [place] [address], [place], [place] [address]   Urine Culture     Status: None   Collection Time: 11/15/18  3:16 PM   Specimen: Urine, Random  Result Value Ref Range Status   Specimen Description   Final    URINE, RANDOM Performed at [facility] [address], [place], [place] [address]    Special Requests   Final    NONE Performed at [facility], [place] [address], [place], [place] [address]    Culture   Final    Multiple bacterial morphotypes present, none predominant. Suggest appropriate recollection if clinically indicated.   Report Status 11/16/2018 FINAL  Final  Urine Culture     Status: None   Collection Time: 11/16/18  4:39 PM   Specimen: Urine, Random  Result Value Ref Range Status   Specimen Description   Final    URINE, RANDOM Performed at [facility] [address], [place], [place] [address]    Special Requests   Final    NONE Performed at [facility], [place] [address], [place], [place] [address]    Culture   Final    NO GROWTH Performed at [facility], [place] [address] [place], [place] [address]    Report Status 11/17/2018 FINAL  Final     Labs: Basic Metabolic Panel: Recent Labs  Lab 11/15/18 1418 11/16/18 0501 11/17/18 0442 11/18/18 0259  NA 134* 138 142 139  K 2.9* 3.1* 4.2 2.9*  CL 111 115* 108 117*  CO2 18* 17* 26 16*  GLUCOSE 92 87 113* 99  BUN 5* <5* 33* <5*  CREATININE 0.63 0.56 0.94 0.54  CALCIUM 8.5* 8.2* 8.5* 8.3*  MG  --  2.1  --   --    Liver Function Tests: Recent Labs  Lab 11/15/18 1418  AST 12*  ALT 10  ALKPHOS 68  BILITOT 0.8   PROT 7.4  ALBUMIN 4.0   Recent Labs  Lab 11/15/18 1418  LIPASE 21   No results for input(s): AMMONIA in the last 168 hours. CBC: Recent Labs  Lab 11/15/18 1418 11/16/18 0501 11/17/18 0442 11/18/18 0259  WBC 10.9* 9.9 11.8* 6.3  NEUTROABS 9.1*  --  7.7 2.8  HGB 12.7 11.0* 9.2* 11.5*  HCT 39.0 34.8* 29.0* 35.6*  MCV 93.3 94.1 95.7 93.9  PLT 214 188 230 216  Cardiac Enzymes: No results for input(s): CKTOTAL, CKMB, CKMBINDEX, TROPONINI in the last 168 hours. BNP: BNP (last 3 results) No results for input(s): BNP in the last 8760 hours.  ProBNP (last 3 results) No results for input(s): PROBNP in the last 8760 hours.  CBG: No results for input(s): GLUCAP in the last 168 hours.     Signed:  Alma Friendly, MD [facility] 11/18/2018, 11:23 AM

## 2018-11-18 NOTE — Progress Notes (Signed)
  PHARMACIST - PHYSICIAN COMMUNICATION DR:   Rico Ala and colleagues CONCERNING: Antibiotic IV to Oral Route Change Policy  RECOMMENDATION: This patient is receiving Ciprofloxacin by the intravenous route.  Based on criteria approved by the Pharmacy and Therapeutics Committee, the antibiotic(s) is/are being converted to the equivalent oral dose form(s).   DESCRIPTION: These criteria include:  Patient being treated for a respiratory tract infection, urinary tract infection, cellulitis or clostridium difficile associated diarrhea if on metronidazole  The patient is not neutropenic and does not exhibit a GI malabsorption state  The patient is eating (either orally or via tube) and/or has been taking other orally administered medications for a least 24 hours  The patient is improving clinically and has a Tmax < 100.5  If you have questions about this conversion, please contact the Pharmacy Department  []   [phone] )  [facility] []   [phone] )  [facility] []   [phone] )  [facility] []   [phone] )  [facility] [x]   [phone] )  [place], PharmD, BCPS Clinical pharmacist 11/18/2018 8:41 AM

## 2018-11-20 ENCOUNTER — Other Ambulatory Visit: Payer: Self-pay | Admitting: *Deleted

## 2018-11-20 MED ORDER — METRONIDAZOLE 0.75 % VA GEL: 1.0000 | Freq: Every day | VAGINAL | 0 refills | Status: DC

## 2018-11-21 ENCOUNTER — Other Ambulatory Visit: Payer: Self-pay | Admitting: Adult Health

## 2018-11-21 ENCOUNTER — Ambulatory Visit: Payer: Medicare Other

## 2018-11-21 DIAGNOSIS — G43009 Migraine without aura, not intractable, without status migrainosus: Secondary | ICD-10-CM

## 2018-11-21 LAB — CULTURE, BLOOD (ROUTINE X 2)
Culture: NO GROWTH
Culture: NO GROWTH
Special Requests: ADEQUATE
Special Requests: ADEQUATE

## 2018-11-23 MED FILL — TOPIRAMATE 50 MG TABLET: 50 | 30 days supply | Qty: 90 | Fill #1

## 2018-11-24 ENCOUNTER — Other Ambulatory Visit: Payer: Self-pay | Admitting: Adult Health

## 2018-11-24 DIAGNOSIS — G43009 Migraine without aura, not intractable, without status migrainosus: Secondary | ICD-10-CM

## 2018-11-27 ENCOUNTER — Telehealth: Payer: Self-pay

## 2018-11-27 NOTE — Telephone Encounter (Signed)
If pt calls back please r/s her with Debbora Presto NP. Pts MD is DR. Jaynee Eagles. Janett Billow NP will not be in the office that week.   LEft vm for patient to call back to r/s with Amy NP for office visit.

## 2018-11-28 ENCOUNTER — Other Ambulatory Visit: Payer: Medicare Other

## 2018-11-28 ENCOUNTER — Ambulatory Visit: Payer: Medicare Other | Admitting: Oncology

## 2018-11-29 ENCOUNTER — Ambulatory Visit: Payer: Medicare Other

## 2018-11-29 MED FILL — POTASSIUM CL ER 10 MEQ CAP: 10 | 15 days supply | Qty: 30 | Fill #3

## 2018-12-01 ENCOUNTER — Ambulatory Visit: Payer: Medicare Other | Admitting: Family Medicine

## 2018-12-02 ENCOUNTER — Encounter: Payer: Self-pay | Admitting: Obstetrics & Gynecology

## 2018-12-04 ENCOUNTER — Telehealth: Payer: Self-pay | Admitting: *Deleted

## 2018-12-04 MED ORDER — FLUCONAZOLE 150 MG PO TABS: ORAL_TABLET | ORAL | 0 refills | Status: DC

## 2018-12-04 NOTE — Telephone Encounter (Signed)
Spoke with patient. She is scheduled for LEEP tomorrow. She was treated for BV and began Metrogel on 11-20-18. Patient states she only took 1 dose of Metrogel and devloped thick white discharged and irritation, which she feels is yeast infection. Patient requesting medication for this. Advised patient Metrogel does give you extra discharge than normal, but not sure it caused yeast. She was on antibiotics in May for UTI and admitted to hospital 11-15-18 with lower abd.pain.  Dr.Miller please advise. Cancel Leep for tomorrow?, need ov to evaluate patient's symptoms? Routed to provider.

## 2018-12-04 NOTE — Telephone Encounter (Signed)
Hello the other doctor prescribed the vaginal gel metronidazole and it gave me a yeast Infection and I stopped using it. I was already on Antibiotics from the hospital. Can you please send the yeast infection pills to [facility] on [address].  Thank you

## 2018-12-04 NOTE — Telephone Encounter (Signed)
Ok to send in rx for diflucan 150mg  po x 1, repeat in 72 hours.  #2/0RF.  I can decide tomorrow if I need to postpone appt but she probably need recheck anyway.  Thanks.

## 2018-12-04 NOTE — Telephone Encounter (Signed)
Spoke with patient and notified will send Rx for Diflucan 150mg  po x1, repeat 72 hours, #2,0R. Advised her to keep appointment for LEEP tomorrow and Dr.Miller will decide if ok to proceed with LEEP or to postpone, but needs recheck either way. Patient voiced understanding.

## 2018-12-05 ENCOUNTER — Telehealth: Payer: Self-pay | Admitting: Obstetrics & Gynecology

## 2018-12-05 ENCOUNTER — Ambulatory Visit: Payer: Self-pay

## 2018-12-05 ENCOUNTER — Ambulatory Visit: Payer: Medicare Other | Admitting: Obstetrics & Gynecology

## 2018-12-05 NOTE — Telephone Encounter (Signed)
Patient called at 3:29pm stating she would be late to her 3:30pm. She had car trouble and was waiting for help. I rescheduled her to 12/07/18 at 10:30am . She stated she was very sorry and promised to arrive early to her appointment Thursday.

## 2018-12-07 ENCOUNTER — Other Ambulatory Visit: Payer: Self-pay

## 2018-12-07 ENCOUNTER — Ambulatory Visit (INDEPENDENT_AMBULATORY_CARE_PROVIDER_SITE_OTHER): Payer: Medicare Other | Admitting: Obstetrics & Gynecology

## 2018-12-07 ENCOUNTER — Encounter: Payer: Self-pay | Admitting: Obstetrics & Gynecology

## 2018-12-07 VITALS — BP 120/72 | HR 96 | Temp 97.1°F | Ht 64.0 in | Wt 186.0 lb

## 2018-12-07 DIAGNOSIS — N898 Other specified noninflammatory disorders of vagina: Secondary | ICD-10-CM | POA: Diagnosis not present

## 2018-12-07 MED ORDER — TERCONAZOLE 0.4 % VA CREA: 1.0000 | TOPICAL_CREAM | Freq: Every day | VAGINAL | 0 refills | Status: DC

## 2018-12-07 NOTE — Progress Notes (Signed)
GYNECOLOGY  VISIT  CC:   LEEP, vaginal itching with discharge  43 yo [id number] Single AA female here for LEEP due to CIN2 noted with biopsies at colposcopy performed 11/02/2018.  Pap obtained before colposcopy showed AGUS on 10/19/2018.    She was hospitalized 11/15/2018 due to pyelonephritis.  She was on IV antibiotics and then discharge on oral antibiotics.  She's been having vaginal itching and discharge since then.  She was treated with Diflucan this week which has helped but the discharge is still present as well as the itching.    She is still undecided about treatment for her fibroid uterus.  She has considered myomectomy as she is not sure she wants to eliminate the posisblity of future child bearing.  However, she has undergone a BTL and the clips were seen on recent CT scan.    Patient's last menstrual period was 11/13/2018 (approximate).          Sexually active: Yes.    The current method of family planning is tubal ligation.     Patient Active Problem List   Diagnosis Date Noted  . Acute pyelonephritis 11/16/2018  . UTI (urinary tract infection) 11/15/2018  . Lymphedema of right arm 02/27/2018  . Dyspnea 09/23/2016  . Eczema 08/31/2016  . Essential hypertension 11/05/2015  . Cervical dystonia 08/25/2015  . Headache 07/09/2015  . Myofascial muscle pain 04/29/2015  . Adhesive capsulitis of right shoulder 04/29/2015  . Type 2 diabetes mellitus without complication ([place]) 74/05/8785  . Lower extremity edema 10/01/2014  . Malignant neoplasm of overlapping sites of right breast in female, estrogen receptor positive ([place]) 05/24/2014  . Myalgia and myositis 05/17/2014  . Neoplasm related pain 03/12/2014  . CN (constipation) 11/26/2013  . H/O reduction mammoplasty 10/30/2013  . Seizure ([place]) 10/06/2013  . Status post bilateral breast implants 08/31/2013  . S/P breast reconstruction, right 08/23/2013  . AC joint arthropathy 07/02/2013  . Routine adult health maintenance 05/29/2013   . Hypokalemia 05/21/2013  . Chronic pain 04/19/2013  . Elevated blood sugar 03/09/2013  . Acquired absence of breast and absent nipple 01/30/2013  . RHINOSINUSITIS, CHRONIC 04/08/2010  . Obesity 08/04/2006  . DEPRESSIVE DISORDER, NOS 08/04/2006    Past Medical History:  Diagnosis Date  . Anxiety   . Cancer ([place]) 2011   Rt. Br. Ca  . Depression   . Essential hypertension 11/05/2015  . History of breast cancer 2011   right  . History of chemotherapy 2011  . History of radiation therapy 2011  . Hypertension    under control with med., has been on med. x 2 yr.  . Lymphedema of arm    right; no BP or puncture to right arm  . Personal history of chemotherapy 09/16/2009   rt breast  . Personal history of radiation therapy 05/2010   rt breast  . Pneumonia   . Seasonal allergies   . Sinus headache     Past Surgical History:  Procedure Laterality Date  . BREAST BIOPSY Right 08/26/2009  . BREAST REDUCTION SURGERY Left 08/23/2013   Procedure: LEFT BREAST REDUCTION  ;  Surgeon: Theodoro Kos, DO;  Location: [facility];  Service: Plastics;  Laterality: Left;  . CESAREAN SECTION  07/15/2001; 03/18/2004; 11/21/2008  . lateral orbiotomy Left 11/2015   Dr. [facility]key at [facility]  . LATISSIMUS FLAP TO BREAST Right 03/12/2013   Procedure: RIGHT BREAST LATISSIMUS FLAP WITH EXPANDER PLACEMENT;  Surgeon: Theodoro Kos, DO;  Location: [facility];  Service: Plastics;  Laterality: Right;  . LIPOSUCTION Bilateral 08/23/2013   Procedure: LIPOSUCTION;  Surgeon: Theodoro Kos, DO;  Location: [facility];  Service: Plastics;  Laterality: Bilateral;  . MASTECTOMY Right 2011  . MODIFIED RADICAL MASTECTOMY Right 03/11/2010  . NASAL SEPTUM SURGERY    . ORIF ANKLE FRACTURE Left 07/18/2018   Procedure: OPEN REDUCTION INTERNAL FIXATION (ORIF) LEFT ANKLE FRACTURE WITH  SYNDESMOSIS AND ANKLE ARTHROTOMY;  Surgeon: Erle Crocker, MD;  Location: [facility];  Service: Orthopedics;   Laterality: Left;  . PORT-A-CATH REMOVAL Left 12/01/2010  . PORTACATH PLACEMENT Left 09/12/2009  . REDUCTION MAMMAPLASTY Left 08/2013  . REMOVAL OF TISSUE EXPANDER AND PLACEMENT OF IMPLANT Right 08/23/2013   Procedure: REMOVAL RIGHT TISSUE EXPANDER AND PLACEMENT OF IMPLANT TO RIGHT BREAST ;  Surgeon: Theodoro Kos, DO;  Location: [facility];  Service: Plastics;  Laterality: Right;  . SUPRA-UMBILICAL HERNIA  [id number]  . TUBAL LIGATION  11/21/2008    MEDS:   Current Outpatient Medications on File Prior to Visit  Medication Sig Dispense Refill  . albuterol (PROAIR HFA) 108 (90 Base) MCG/ACT inhaler Inhale 2 puffs into the lungs every 6 (six) hours as needed for wheezing or shortness of breath. 18 g 3  . diclofenac sodium (VOLTAREN) 1 % GEL Apply 4 g topically 4 (four) times daily. Use as directed 3 Tube 4  . potassium chloride (MICRO-K) 10 MEQ CR capsule Take 2 capsules (20 mEq total) by mouth daily. 30 capsule 0  . tiZANidine (ZANAFLEX) 4 MG tablet TAKE ONE (1) TABLET BY MOUTH 3 TIMES DAILY (Patient taking differently: Take 4 mg by mouth every 8 (eight) hours as needed for muscle spasms. ) 90 tablet 3  . topiramate (TOPAMAX) 50 MG tablet TAKE 3 TABLETS (150 MG TOTAL) BY MOUTH AT BEDTIME. 90 tablet 1   No current facility-administered medications on file prior to visit.     ALLERGIES: Penicillins, Lyrica [pregabalin], Meloxicam, and Robaxin [methocarbamol]  Family History  Problem Relation Age of Onset  . Hypertension Mother   . Breast cancer Mother 6  . Diabetes type II Father   . Prostate cancer Father   . Stroke Neg Hx   . Colon polyps Neg Hx   . Esophageal cancer Neg Hx   . Pancreatic cancer Neg Hx   . Stomach cancer Neg Hx   . Rectal cancer Neg Hx     SH:  Single, non smoker  Review of Systems  Constitutional: Negative.   Gastrointestinal: Negative.   Genitourinary: Positive for vaginal discharge (with itching). Negative for dysuria.    PHYSICAL EXAMINATION:     BP 120/72   Pulse 96   Temp (!) 97.1 F (36.2 C) (Temporal)   Ht 5\' 4"  (1.626 m)   Wt 186 lb (84.4 kg)   LMP 11/13/2018 (Approximate)   BMI 31.93 kg/m     General appearance: alert, cooperative and appears stated age Lymph:  no inguinal LAD noted  Pelvic: External genitalia:  no lesions              Urethra:  normal appearing urethra with no masses, tenderness or lesions              Bartholins and Skenes: normal                 Vagina: significant vaginal erythema with large amount of white and clumpy dicharge, due to irritation affirm swab obtained and LEEP postposted  Cervix: no lesions              Bimanual Exam:  Uterus:  normal size, contour, position, consistency, mobility, non-tender and enlarged, 10-12 weeks, mobile weeks size              Adnexa: no mass, fullness, tenderness  Chaperone was present for exam.  Assessment: Vaginal discharge CIN 2 Fibroid uterus with menorrhagia and dysmenorrhea Possible desire for future child bearing  Plan: Terazol 7 nightly x 7 nights to pharmacy.  Recheck about 10 days and proceed with LEEP at that time Affirm pending She is going to consider options for surgical/procedural treatment that were discussed today.  The biggest question is really whether or not she desires tubal reversal procedure.  She is really going to consider this and let me know about referral or surgery decision at next visit.

## 2018-12-08 ENCOUNTER — Other Ambulatory Visit: Payer: Self-pay | Admitting: Oncology

## 2018-12-08 LAB — VAGINITIS/VAGINOSIS, DNA PROBE
Candida Species: NEGATIVE
Gardnerella vaginalis: POSITIVE — AB
Trichomonas vaginosis: NEGATIVE

## 2018-12-11 ENCOUNTER — Ambulatory Visit: Payer: Medicare Other | Admitting: Adult Health

## 2018-12-11 ENCOUNTER — Encounter: Payer: Self-pay | Admitting: Family Medicine

## 2018-12-11 ENCOUNTER — Other Ambulatory Visit: Payer: Self-pay

## 2018-12-11 ENCOUNTER — Ambulatory Visit (INDEPENDENT_AMBULATORY_CARE_PROVIDER_SITE_OTHER): Payer: Medicare Other | Admitting: Family Medicine

## 2018-12-11 ENCOUNTER — Telehealth: Payer: Self-pay | Admitting: Obstetrics & Gynecology

## 2018-12-11 VITALS — BP 133/82 | HR 98 | Temp 98.0°F | Ht 64.0 in | Wt 186.2 lb

## 2018-12-11 DIAGNOSIS — R413 Other amnesia: Secondary | ICD-10-CM | POA: Diagnosis not present

## 2018-12-11 DIAGNOSIS — G43009 Migraine without aura, not intractable, without status migrainosus: Secondary | ICD-10-CM | POA: Diagnosis not present

## 2018-12-11 MED ORDER — METRONIDAZOLE 500 MG PO TABS: 500.0000 mg | ORAL_TABLET | Freq: Two times a day (BID) | ORAL | 0 refills | Status: DC

## 2018-12-11 MED ORDER — ERENUMAB-AOOE 140 MG/ML ~~LOC~~ SOAJ: 140.0000 mg | SUBCUTANEOUS | 3 refills | Status: DC

## 2018-12-11 MED FILL — POTASSIUM CL ER 10 MEQ CAP: 10 | 15 days supply | Qty: 30 | Fill #0

## 2018-12-11 NOTE — Progress Notes (Signed)
PATIENT: Karen Hopkins DOB: [birth year]  REASON FOR VISIT: follow up HISTORY FROM: patient  Chief Complaint  Patient presents with   Follow-up    6 mon f/u. Alone. Rm 9. Patient mentioned that she has started to have headaches again, She currently has a headache.      HISTORY OF PRESENT ILLNESS: Today 12/11/18 Karen Hopkins is a 43 y.o. female here today for follow up of migraines and subjective memory loss. We decreased her topiramate to 150mg  nightly at her last visit in 06/2018 as migraines were well managed and she was complaining of memory loss. She reports that she has started to have more headaches over the past 6 months. She has not documented headaches and is uncertain how frequently these are occurring. She has a headache today. She estimates about 1 migraine a week. She reports that headaches start at night and she wakes with a headache. It is usually bifrontal. It is pounding and hurts behind her eye. She usually takes Tylenol and drinks water. She is taking topiramate 150mg  daily without adverse effects. She feels that memory loss was related to previous medications including oxycodone, xanax and ambien. She feels that she is feeling much better and memory has improved.    HISTORY: (copied from [place] note on 06/09/2018)  Interval history 06/09/2018: Patient is being seen today as she was requesting refill of topiramate but advised her that she needs to be seen in office prior to refill.  She does state that migraines have been managed well with topamax and unable to state to when her last migraine occurred.  She does have new concerns today of memory loss which has been worsening over the past year.  She does have some underlying cognitive deficits due to prior radiation treatment but she does endorse worsening recently.  She says she has a difficult time remembering dates such as scheduled appointments and activities that her children are involved in.  She states she  writes them down on a calendar but at times will even forget to look at her calendar to see if she has any scheduled appointments.  Her children are 11, 14 and 9 and are actively involved in sports and she does endorse feeling overwhelmed with trying to manage all of this.  She does see a psychiatrist for underlying depression and anxiety.  She states that she is unsure if she will be discharged from this clinic due to multiple no-shows as she is unable to remember her scheduled appointments.  She also has an extensive history of chronic pain and neuropathy for which she is being treated with gabapentin, tizanidine and oxycodone.  She is currently being prescribed Xanax, Cymbalta, Prozac and Ambien for depression and anxiety.   10/27/2016 visit Dr. Jaynee Eagles: No more headaches, doing well on the Topiramate. No issues whatsoever. No side effects to the topiramate. She is doing extremely well. She has also lost weight which is good. She feels much better. She follows with cardiology. No vision changes. Continue topiramate 200 mg a day.  06/29/2016 visit Dr. Shana Chute for follow up of headaches. The headaches have significantly improved. No side from the Topiramate. She has lost weight which is good. She feels much better. She follows with cardiology. She is feeling very well. No vision changes. Will repeat MRI brain in April. Will increase Topiramate to 200mg  a day.   01/27/2016 visit Dr. Jaynee Eagles: 43 y.o. female here as a referral from Dr. Ardelia Mems for intractable headaches. PMHx of  right breast carcinoma, Status post modified radical mastectomy with postoperative radiation therapy In 2014, HTN, diabetes, migraine, depression, anxiety. She has chronic migraines. MRI of the brain showed a focus of FLAIR hyperintensity, right frontal subcortical white matter possibly a nonspecific focus of ischemic demyelination, or possible post treatment effect. Also seen was an Empty sella.She is on ASA 81mg  for stroke  prevention. She went for surgery and saw [facility]mology at [facility] for enlarged lacrimal glands, biopsy was performed and biopsy was unrevealing and steroid injection swere placed into both eyes. She saw cardiology for evaluation after echocardiogram. She is being managed by cardiology for her HTN. Her migraines are improved she still have some headaches around the eyes. No vision changes, no blurry vision, no hearing changes, no side effects to the Topiramate. She has 3 boys no more children.    HPI: Initial visit 09/01/2015 AA: Karen Hopkins a 43 y.o.femalehere as a referral from Dr. Rayburn Go intractable headaches. PMHx of right breast carcinoma, Status post modified radical mastectomy with postoperative radiation therapy In 2014, HTN, diabetes, migraine, depression, anxiety. She has had headaches for years. Since she was a teenager. Has to go into a dark room. The headache is worse with laying down when it is very bad. She has to sit very still. She wakes up with the headaches. Not taking tylenol, advil or naproxen daily no overuse headache. Headache starts on the right around the eye, throbbing, pulsing, and spreads to the other side of the head. She has nausea, no vomiting. She has photophobia, phonophobia. She wakes up with headaches. Snores a lot, kids tell her she excessively snores. She is very tired during the day. She also has cervical dystonia and just received botox. She has a headache several times a week and can last up to 3 days each. At least 15-20 headaches a month and at least 8 are migrainous. This frequency for over a year. Can be severe 10/10 pain. She can be excessively tired during the week but she had a sleep test for OSA neg 06/23/2011.  REVIEW OF SYSTEMS: Out of a complete 14 system review of symptoms, the patient complains only of the following symptoms, and all other reviewed systems are negative.  ALLERGIES: Allergies  Allergen Reactions   Penicillins  Swelling    FACIAL SWELLING Did it involve swelling of the face/tongue/throat, SOB, or low BP? Yes Did it involve sudden or severe rash/hives, skin peeling, or any reaction on the inside of your mouth or nose? Unknown Did you need to seek medical attention at a hospital or doctor's office? No When did it last happen?More than 10 years If all above answers are "NO", may proceed with cephalosporin use.    Lyrica [Pregabalin] Nausea Only    .   Meloxicam Nausea Only   Robaxin [Methocarbamol] Nausea Only    HOME MEDICATIONS: Outpatient Medications Prior to Visit  Medication Sig Dispense Refill   albuterol (PROAIR HFA) 108 (90 Base) MCG/ACT inhaler Inhale 2 puffs into the lungs every 6 (six) hours as needed for wheezing or shortness of breath. 18 g 3   diclofenac sodium (VOLTAREN) 1 % GEL Apply 4 g topically 4 (four) times daily. Use as directed 3 Tube 4   potassium chloride (MICRO-K) 10 MEQ CR capsule Take 2 capsules (20 mEq total) by mouth daily. 30 capsule 0   terconazole (TERAZOL 7) 0.4 % vaginal cream Place 1 applicator vaginally at bedtime. 45 g 0   tiZANidine (ZANAFLEX) 4 MG tablet TAKE  ONE (1) TABLET BY MOUTH 3 TIMES DAILY (Patient taking differently: Take 4 mg by mouth every 8 (eight) hours as needed for muscle spasms. ) 90 tablet 3   topiramate (TOPAMAX) 50 MG tablet TAKE 3 TABLETS (150 MG TOTAL) BY MOUTH AT BEDTIME. 90 tablet 1   No facility-administered medications prior to visit.     PAST MEDICAL HISTORY: Past Medical History:  Diagnosis Date   Anxiety    Cancer ([place]) 2011   Rt. Br. Ca   Depression    Essential hypertension 11/05/2015   History of breast cancer 2011   right   History of chemotherapy 2011   History of radiation therapy 2011   Hypertension    under control with med., has been on med. x 2 yr.   Lymphedema of arm    right; no BP or puncture to right arm   Personal history of chemotherapy 09/16/2009   rt breast   Personal history of  radiation therapy 05/2010   rt breast   Pneumonia    Seasonal allergies    Sinus headache     PAST SURGICAL HISTORY: Past Surgical History:  Procedure Laterality Date   BREAST BIOPSY Right 08/26/2009   BREAST REDUCTION SURGERY Left 08/23/2013   Procedure: LEFT BREAST REDUCTION  ;  Surgeon: Theodoro Kos, DO;  Location: [facility];  Service: Plastics;  Laterality: Left;   CESAREAN SECTION  07/15/2001; 03/18/2004; 11/21/2008   lateral orbiotomy Left 11/2015   Dr. Toy Cookey at [facility] Right 03/12/2013   Procedure: RIGHT BREAST LATISSIMUS FLAP WITH EXPANDER PLACEMENT;  Surgeon: Theodoro Kos, DO;  Location: [facility];  Service: Plastics;  Laterality: Right;   LIPOSUCTION Bilateral 08/23/2013   Procedure: LIPOSUCTION;  Surgeon: Theodoro Kos, DO;  Location: [place];  Service: Plastics;  Laterality: Bilateral;   MASTECTOMY Right 2011   MODIFIED RADICAL MASTECTOMY Right 03/11/2010   NASAL SEPTUM SURGERY     ORIF ANKLE FRACTURE Left 07/18/2018   Procedure: OPEN REDUCTION INTERNAL FIXATION (ORIF) LEFT ANKLE FRACTURE WITH  SYNDESMOSIS AND ANKLE ARTHROTOMY;  Surgeon: Erle Crocker, MD;  Location: [facility];  Service: Orthopedics;  Laterality: Left;   PORT-A-CATH REMOVAL Left 12/01/2010   PORTACATH PLACEMENT Left 09/12/2009   REDUCTION MAMMAPLASTY Left 08/2013   REMOVAL OF TISSUE EXPANDER AND PLACEMENT OF IMPLANT Right 08/23/2013   Procedure: REMOVAL RIGHT TISSUE EXPANDER AND PLACEMENT OF IMPLANT TO RIGHT BREAST ;  Surgeon: Theodoro Kos, DO;  Location: [facility];  Service: Plastics;  Laterality: Right;   SUPRA-UMBILICAL HERNIA  [id number]   TUBAL LIGATION  11/21/2008    FAMILY HISTORY: Family History  Problem Relation Age of Onset   Hypertension Mother    Breast cancer Mother 33   Diabetes type II Father    Prostate cancer Father    Stroke Neg Hx    Colon polyps Neg Hx    Esophageal cancer  Neg Hx    Pancreatic cancer Neg Hx    Stomach cancer Neg Hx    Rectal cancer Neg Hx     SOCIAL HISTORY: Social History   Socioeconomic History   Marital status: Single    Spouse name: Not on file   Number of children: 3   Years of education: 16   Highest education level: Not on file  Occupational History   Occupation: Disability   Social Designer, fashion/clothing strain: Not on file   Food insecurity  Worry: Not on file    Inability: Not on file   Transportation needs    Medical: Not on file    Non-medical: Not on file  Tobacco Use   Smoking status: Former Smoker    Packs/day: 0.25    Years: 5.00    Pack years: 1.25    Types: Cigarettes    Start date: 06/07/2002    Quit date: 04/26/2010    Years since quitting: 8.6   Smokeless tobacco: Never Used  Substance and Sexual Activity   Alcohol use: No   Drug use: No   Sexual activity: Yes    Birth control/protection: Surgical  Lifestyle   Physical activity    Days per week: Not on file    Minutes per session: Not on file   Stress: Not on file  Relationships   Social connections    Talks on phone: Not on file    Gets together: Not on file    Attends religious service: Not on file    Active member of club or organization: Not on file    Attends meetings of clubs or organizations: Not on file    Relationship status: Not on file   Intimate partner violence    Fear of current or ex partner: Not on file    Emotionally abused: Not on file    Physically abused: Not on file    Forced sexual activity: Not on file  Other Topics Concern   Not on file  Social History Narrative   Lives with kids   Caffeine use: none       PHYSICAL EXAM  Vitals:   12/11/18 0908  BP: 133/82  Pulse: 98  Temp: 98 F (36.7 C)  TempSrc: Oral  Weight: 186 lb 3.2 oz (84.5 kg)  Height: 5\' 4"  (1.626 m)   Body mass index is 31.96 kg/m.  Generalized: Well developed, in no acute distress  Cardiology: normal rate  and rhythm, no murmur noted Neurological examination  Mentation: Alert oriented to time, place, history taking. Follows all commands speech and language fluent Cranial nerve II-XII: Pupils were equal round reactive to light. Extraocular movements were full, visual field were full on confrontational test. Facial sensation and strength were normal. Uvula tongue midline. Head turning and shoulder shrug  were normal and symmetric. Motor: The motor testing reveals 5 over 5 strength of all 4 extremities. Good symmetric motor tone is noted throughout.  Sensory: Sensory testing is intact to soft touch on all 4 extremities. No evidence of extinction is noted.  Coordination: Cerebellar testing reveals good finger-nose-finger and heel-to-shin bilaterally.  Gait and station: Gait is normal. Tandem gait is normal. Romberg is negative. No drift is seen.  Reflexes: Deep tendon reflexes are symmetric and normal bilaterally.   DIAGNOSTIC DATA (LABS, IMAGING, TESTING) - I reviewed patient records, labs, notes, testing and imaging myself where available.  No flowsheet data found.   Lab Results  Component Value Date   WBC 6.3 11/18/2018   HGB 11.5 (L) 11/18/2018   HCT 35.6 (L) 11/18/2018   MCV 93.9 11/18/2018   PLT 216 11/18/2018      Component Value Date/Time   NA 139 11/18/2018 0259   NA 141 05/25/2017 1140   K 2.9 (L) 11/18/2018 0259   K 3.3 (L) 05/25/2017 1140   CL 117 (H) 11/18/2018 0259   CL 105 09/27/2012 1044   CO2 16 (L) 11/18/2018 0259   CO2 25 05/25/2017 1140   GLUCOSE 99 11/18/2018 0259  GLUCOSE 96 05/25/2017 1140   GLUCOSE 118 (H) 09/27/2012 1044   BUN <5 (L) 11/18/2018 0259   BUN 9.1 05/25/2017 1140   CREATININE 0.54 11/18/2018 0259   CREATININE 0.80 09/13/2017 1322   CREATININE 0.8 05/25/2017 1140   CALCIUM 8.3 (L) 11/18/2018 0259   CALCIUM 9.3 05/25/2017 1140   PROT 7.4 11/15/2018 1418   PROT 7.9 05/25/2017 1140   ALBUMIN 4.0 11/15/2018 1418   ALBUMIN 3.9 05/25/2017 1140    AST 12 (L) 11/15/2018 1418   AST 9 09/13/2017 1322   AST 9 05/25/2017 1140   ALT 10 11/15/2018 1418   ALT 9 09/13/2017 1322   ALT 11 05/25/2017 1140   ALKPHOS 68 11/15/2018 1418   ALKPHOS 113 05/25/2017 1140   BILITOT 0.8 11/15/2018 1418   BILITOT 0.3 09/13/2017 1322   BILITOT 0.23 05/25/2017 1140   GFRNONAA >60 11/18/2018 0259   GFRNONAA >60 09/13/2017 1322   GFRAA >60 11/18/2018 0259   GFRAA >60 09/13/2017 1322   Lab Results  Component Value Date   CHOL 100 09/17/2015   HDL 24 (L) 09/17/2015   LDLCALC 55 09/17/2015   TRIG 105 09/17/2015   CHOLHDL 4.2 09/17/2015   Lab Results  Component Value Date   HGBA1C 5.7 (H) 04/28/2018   Lab Results  Component Value Date   VITAMINB12 383 06/09/2018   Lab Results  Component Value Date   TSH 1.170 06/09/2018    ASSESSMENT AND PLAN 43 y.o. year old female  has a past medical history of Anxiety, Cancer ([place]) (2011), Depression, Essential hypertension (11/05/2015), History of breast cancer (2011), History of chemotherapy (2011), History of radiation therapy (2011), Hypertension, Lymphedema of arm, Personal history of chemotherapy (09/16/2009), Personal history of radiation therapy (05/2010), Pneumonia, Seasonal allergies, and Sinus headache. here with    ICD-10-CM   1. Migraine without aura and without status migrainosus, not intractable  G43.009   2. Memory deficit  R41.3     Ms. Pearson has noted an increase in frequency and intensity of her migraines since reducing topiramate to 150 mg at night she is interested in adding another preventative medication.  We will start Aimovig 140 mg injected subcutaneously every 30 days.  She was educated on appropriate administration and storage of this medication.  Additional educational materials provided in AVS.  She was advised to drink plenty of water while taking medication.  We will continue Tylenol as needed for abortive therapy.  As for memory concerns, she feels that these have improved  since stopping pain and anxiety medications.  We will continue to monitor closely.  She was advised to follow-up in 6 months, sooner if needed.  She verbalizes understanding and agreement with this plan.   No orders of the defined types were placed in this encounter.    Meds ordered this encounter  Medications   Erenumab-aooe 140 MG/ML SOAJ    Sig: Inject 140 mg into the skin every 30 (thirty) days.    Dispense:  3 pen    Refill:  3    Order Specific Question:   Supervising Provider    Answer:   Melvenia Beam [id number]      I spent 15 minutes with the patient. 50% of this time was spent counseling and educating patient on plan of care and medications.    Debbora Presto, FNP-C 12/11/2018, 9:52 AM [facility] [address], [place], [place] [address] [phone]

## 2018-12-11 NOTE — Telephone Encounter (Signed)
Spoke with patient. She has been using Terazol cream, not Metrogel. Will send Rx for Flagyl tablets to [facility] per Dr.Miller's note on result note from vaginitis testing 12/07/18.

## 2018-12-11 NOTE — Telephone Encounter (Signed)
Patient has follow up questions for nurse.

## 2018-12-11 NOTE — Patient Instructions (Signed)
Continue topiramate 150mg  nightly  Start Amovig 140mg  by injection every 30 days  Continue Tylenol for abortive therapy  Erenumab injection What is this medicine? ERENUMAB (e REN ue mab) is used to prevent migraine headaches. This medicine may be used for other purposes; ask your health care provider or pharmacist if you have questions. COMMON BRAND NAME(S): Aimovig What should I tell my health care provider before I take this medicine? They need to know if you have any of these conditions:  an unusual or allergic reaction to erenumab, latex, other medicines, foods, dyes, or preservatives  high blood pressure  pregnant or trying to get pregnant  breast-feeding How should I use this medicine? This medicine is for injection under the skin. You will be taught how to prepare and give this medicine. Use exactly as directed. Take your medicine at regular intervals. Do not take your medicine more often than directed. It is important that you put your used needles and syringes in a special sharps container. Do not put them in a trash can. If you do not have a sharps container, call your pharmacist or healthcare provider to get one. Talk to your pediatrician regarding the use of this medicine in children. Special care may be needed. Overdosage: If you think you have taken too much of this medicine contact a poison control center or emergency room at once. NOTE: This medicine is only for you. Do not share this medicine with others. What if I miss a dose? If you miss a dose, take it as soon as you can. If it is almost time for your next dose, take only that dose. Do not take double or extra doses. What may interact with this medicine? Interactions are not expected. This list may not describe all possible interactions. Give your health care provider a list of all the medicines, herbs, non-prescription drugs, or dietary supplements you use. Also tell them if you smoke, drink alcohol, or use illegal  drugs. Some items may interact with your medicine. What should I watch for while using this medicine? Tell your doctor or healthcare professional if your symptoms do not start to get better or if they get worse. What side effects may I notice from receiving this medicine? Side effects that you should report to your doctor or health care professional as soon as possible:  allergic reactions like skin rash, itching or hives, swelling of the face, lips, or tongue  chest pain  fast, irregular heartbeat  feeling faint or lightheaded  palpitations Side effects that usually do not require medical attention (report these to your doctor or health care professional if they continue or are bothersome):  constipation  muscle cramps  pain, redness, or irritation at site where injected This list may not describe all possible side effects. Call your doctor for medical advice about side effects. You may report side effects to FDA at 1-800-FDA-1088. Where should I keep my medicine? Keep out of the reach of children. You will be instructed on how to store this medicine. Throw away any unused medicine after the expiration date on the label. NOTE: This sheet is a summary. It may not cover all possible information. If you have questions about this medicine, talk to your doctor, pharmacist, or health care provider.  2020 Elsevier/Gold Standard (2018-10-09 15:43:58)

## 2018-12-12 DIAGNOSIS — H5213 Myopia, bilateral: Secondary | ICD-10-CM | POA: Diagnosis not present

## 2018-12-12 NOTE — Progress Notes (Signed)
Made any corrections needed, and agree with history, physical, neuro exam,assessment and plan as stated.     Antonia Ahern, MD [facility]  

## 2018-12-18 ENCOUNTER — Telehealth: Payer: Self-pay | Admitting: Obstetrics & Gynecology

## 2018-12-18 ENCOUNTER — Ambulatory Visit: Payer: Medicare Other | Admitting: Obstetrics & Gynecology

## 2018-12-18 DIAGNOSIS — H5213 Myopia, bilateral: Secondary | ICD-10-CM | POA: Diagnosis not present

## 2018-12-18 NOTE — Telephone Encounter (Signed)
Patient states she started her cycle this morning and would like to reschedule her LEEP procedure. Appointment for today canceled.

## 2018-12-18 NOTE — Telephone Encounter (Signed)
Spoke with patient. Her cycle began on 12-16-18 and needs to R/S LEEP for today. Discussed with Dr.Miller and moved her to 12/21/18 at 11:00am for LEEP. Patient voiced understanding.

## 2018-12-19 DIAGNOSIS — H5213 Myopia, bilateral: Secondary | ICD-10-CM | POA: Diagnosis not present

## 2018-12-20 MED FILL — POTASSIUM CL ER 10 MEQ CAP: 10 | 15 days supply | Qty: 30 | Fill #1

## 2018-12-21 ENCOUNTER — Other Ambulatory Visit: Payer: Self-pay

## 2018-12-21 ENCOUNTER — Encounter: Payer: Self-pay | Admitting: Obstetrics & Gynecology

## 2018-12-21 ENCOUNTER — Ambulatory Visit (INDEPENDENT_AMBULATORY_CARE_PROVIDER_SITE_OTHER): Payer: Medicare Other | Admitting: Obstetrics & Gynecology

## 2018-12-21 ENCOUNTER — Telehealth: Payer: Self-pay | Admitting: *Deleted

## 2018-12-21 VITALS — BP 110/76 | HR 80 | Temp 97.2°F | Ht 64.0 in | Wt 179.0 lb

## 2018-12-21 DIAGNOSIS — N871 Moderate cervical dysplasia: Secondary | ICD-10-CM

## 2018-12-21 DIAGNOSIS — R87619 Unspecified abnormal cytological findings in specimens from cervix uteri: Secondary | ICD-10-CM

## 2018-12-21 DIAGNOSIS — N870 Mild cervical dysplasia: Secondary | ICD-10-CM | POA: Diagnosis not present

## 2018-12-21 NOTE — Progress Notes (Signed)
43 y.o. G3P2 Single AAF here for LEEP due to CIN 2 noted with biopsies at colposcopy performed 11/03/2018.  Pap obtained before colposcopy showed AGUS on 10/19/2018.    Patient's last menstrual period was 12/15/2018 (exact date).          Sexually active: Yes.    The current method of family planning is tubal ligation.     Pre-procedure vitals: Blood pressure 110/76, pulse 80, temperature (!) 97.2 F (36.2 C), temperature source Temporal, height 5\' 4"  (1.626 m), weight 179 lb (81.2 kg), last menstrual period 12/15/2018.   Procedure explained and patient's questions were invited and answered.   Consent form signed.  Pre-procedure medication:  Motrin 800mg .  Time given:  11:00am  Procedure Set-up: Grounding pad located left thigh.  Cautery settings: 55 cut/55 coagulation.  Suction applied to coated speculum.  Procedure:  Speculum placed with good visualization of the cervix.  Colposcopy performed showing:  acetowhite lesion(s) noted at 4 o'clock where  o'clock.  Cervix anesthetized using 2% Xylocaine with 1:100,000units Epinephrine.  8 cc's used.  Lot:  [id number].  Exp:  05/2019.  Entire transition zone excised with 12 x 15 loop in 1 passes.  Specimen(s) placed on cork and labeled for pathology.  Green pin at 12 o'clock and purple pin at 6 o'clock.  ECC then obtained.  Hemostasis obtained with ball cautery and Monsel's solution.  EBL:  Minimal  Complications:  none  Patient tolerated procedure well and left the office in satisfactory condition.  Plan:  After visit summary given.  Repeat pap smear will be scheduled pending results of pathology.

## 2018-12-21 NOTE — Telephone Encounter (Signed)
-----   Message from Megan Salon, MD sent at 12/21/2018 11:32 AM EDT ----- Regarding: GI referral Sharee Pimple, Can you call over the [facility].  I sent this pt for a referral and she had a video visit in May.  She did have a CT but is still losing weight and having abdominal pain.  She needs follow-up.  Can you see if you can schedule and appt?  Thanks.  Vinnie Level

## 2018-12-21 NOTE — Patient Instructions (Signed)
LEEP Post Procedure Instructions  1. You may take Ibuprofen, Aleve or Tylenol for pain if needed, cramping is normal   2. You will have black and/or bloody discharge at first. This will lighten and turn clear before completely resolving.. This will take 2 to 3 weeks.  3. Put nothing in your vagina for 4 full weeks.   4. You need to call if you have redness around the biopsy site, if there is any unusual draining, if the bleeding is heavy, or if you are concerned.  5. Shower and bathe as normal.  6. We will call you within a week with results or we will discuss the results at your follow-up appointment if needed.  7. You will need to return for a follow-up Pap smear as directed by your physician.

## 2018-12-21 NOTE — Telephone Encounter (Signed)
Spoke with Karen Hopkins at [facility], scheduled for first available OV with Dr. Tarri Glenn on 8/6 at 9:50am. Was advised their office will start seeing patients in office in August.

## 2018-12-21 NOTE — Telephone Encounter (Signed)
Call to patient, advised of appt as scheduled with GI. Patient verbalizes understanding and is agreeable to date and time.   Routing to provider for final review. Patient is agreeable to disposition. Will close encounter.

## 2019-01-02 ENCOUNTER — Telehealth: Payer: Self-pay | Admitting: Obstetrics & Gynecology

## 2019-01-02 NOTE — Telephone Encounter (Signed)
Patient's ultrasound for 01/18/19 has been cancelled due to Dr. Sabra Heck being out of the office and needs to be rescheduled.

## 2019-01-03 NOTE — Telephone Encounter (Signed)
Call placed to patient to reschedule ultrasound. Left voicemail message requesting a return call

## 2019-01-04 ENCOUNTER — Telehealth: Payer: Self-pay | Admitting: Obstetrics & Gynecology

## 2019-01-04 MED FILL — POTASSIUM CL ER 10 MEQ CAP: 10 | 15 days supply | Qty: 30 | Fill #2

## 2019-01-04 NOTE — Telephone Encounter (Signed)
Patient returned call to rescheduled ultrasound appointment from 01/18/2019. Patient is rescheduled 01/11/2019 with Dr Sabra Heck. Patient is aware of the appointment date, arrival time and cancellation policy. No further questions  Forwarding to Lamont Snowball, RN

## 2019-01-04 NOTE — Telephone Encounter (Signed)
Patient is asking to talk with Dr.Miller's nurse.No further details given.

## 2019-01-04 NOTE — Telephone Encounter (Signed)
Spoke with patient. She had LEEP procedure 12-21-18 and states dark brown discharge had improved. Now with light brown/creamy white/pinkage discharge with odor--this is different and not sure if has developed some type of infection. Will discuss with Dr.Miller and call her back.   Spoke with Dr.Miller and scheduled patient appointment for tomorrow at 12:00pm.

## 2019-01-04 NOTE — Telephone Encounter (Signed)
Patient attempted to return a call to [facility] phone line. Please return call to patient.

## 2019-01-05 ENCOUNTER — Ambulatory Visit (INDEPENDENT_AMBULATORY_CARE_PROVIDER_SITE_OTHER): Payer: Medicare Other | Admitting: Obstetrics & Gynecology

## 2019-01-05 ENCOUNTER — Other Ambulatory Visit: Payer: Self-pay

## 2019-01-05 ENCOUNTER — Encounter: Payer: Self-pay | Admitting: Obstetrics & Gynecology

## 2019-01-05 VITALS — BP 110/76 | HR 80 | Temp 97.2°F | Ht 64.0 in | Wt 177.0 lb

## 2019-01-05 DIAGNOSIS — N898 Other specified noninflammatory disorders of vagina: Secondary | ICD-10-CM

## 2019-01-05 MED ORDER — FLUCONAZOLE 150 MG PO TABS: ORAL_TABLET | ORAL | 0 refills | Status: DC

## 2019-01-05 MED ORDER — METRONIDAZOLE 0.75 % VA GEL: 1.0000 | Freq: Every day | VAGINAL | 0 refills | Status: DC

## 2019-01-05 NOTE — Addendum Note (Signed)
Addended by: Polly Cobia on: 01/05/2019 02:55 PM   Modules accepted: Orders

## 2019-01-05 NOTE — Progress Notes (Signed)
GYNECOLOGY  VISIT  CC:   Vaginal discharge since LEEP  HPI: 43 y.o. G69P2102 [name] here for vaginal discharge with odor.  Reports after her LEEP, on July 16, she had blackish discharge for a few days.  This is typically from the [facility] and is appropriate and common.  Then she had a little pinkish discharge that resolved.  Subsequently, she starting having increased cream color discharge with odor.  Denies vaginal bleeding.    GYNECOLOGIC HISTORY: Patient's last menstrual period was 12/15/2018 (exact date). Contraception: BTL Menopausal hormone therapy: none  Patient Active Problem List   Diagnosis Date Noted  . Acute pyelonephritis 11/16/2018  . Lymphedema of right arm 02/27/2018  . Dyspnea 09/23/2016  . Eczema 08/31/2016  . Essential hypertension 11/05/2015  . Cervical dystonia 08/25/2015  . Headache 07/09/2015  . Myofascial muscle pain 04/29/2015  . Adhesive capsulitis of right shoulder 04/29/2015  . Type 2 diabetes mellitus without complication ([place]) 29/56/2130  . Lower extremity edema 10/01/2014  . Malignant neoplasm of overlapping sites of right breast in female, estrogen receptor positive ([place]) 05/24/2014  . Myalgia and myositis 05/17/2014  . Neoplasm related pain 03/12/2014  . CN (constipation) 11/26/2013  . H/O reduction mammoplasty 10/30/2013  . Seizure ([place]) 10/06/2013  . Status post bilateral breast implants 08/31/2013  . S/P breast reconstruction, right 08/23/2013  . AC joint arthropathy 07/02/2013  . Routine adult health maintenance 05/29/2013  . Hypokalemia 05/21/2013  . Chronic pain 04/19/2013  . Acquired absence of breast and absent nipple 01/30/2013  . RHINOSINUSITIS, CHRONIC 04/08/2010  . DEPRESSIVE DISORDER, NOS 08/04/2006    Past Medical History:  Diagnosis Date  . Anxiety   . Cancer ([place]) 2011   Rt. Br. Ca  . Depression   . Essential hypertension 11/05/2015  . History of breast cancer 2011   right  . History of  chemotherapy 2011  . History of radiation therapy 2011  . Hypertension    under control with med., has been on med. x 2 yr.  . Lymphedema of arm    right; no BP or puncture to right arm  . Personal history of chemotherapy 09/16/2009   rt breast  . Personal history of radiation therapy 05/2010   rt breast  . Pneumonia   . Seasonal allergies   . Sinus headache     Past Surgical History:  Procedure Laterality Date  . BREAST BIOPSY Right 08/26/2009  . BREAST REDUCTION SURGERY Left 08/23/2013   Procedure: LEFT BREAST REDUCTION  ;  Surgeon: Theodoro Kos, DO;  Location: [facility];  Service: Plastics;  Laterality: Left;  . CESAREAN SECTION  07/15/2001; 03/18/2004; 11/21/2008  . lateral orbiotomy Left 11/2015   Dr. Toy Cookey at [facility]  . LATISSIMUS FLAP TO BREAST Right 03/12/2013   Procedure: RIGHT BREAST LATISSIMUS FLAP WITH EXPANDER PLACEMENT;  Surgeon: Theodoro Kos, DO;  Location: [place];  Service: Plastics;  Laterality: Right;  . LIPOSUCTION Bilateral 08/23/2013   Procedure: LIPOSUCTION;  Surgeon: Theodoro Kos, DO;  Location: [place];  Service: Plastics;  Laterality: Bilateral;  . MASTECTOMY Right 2011  . MODIFIED RADICAL MASTECTOMY Right 03/11/2010  . NASAL SEPTUM SURGERY    . ORIF ANKLE FRACTURE Left 07/18/2018   Procedure: OPEN REDUCTION INTERNAL FIXATION (ORIF) LEFT ANKLE FRACTURE WITH  SYNDESMOSIS AND ANKLE ARTHROTOMY;  Surgeon: Erle Crocker, MD;  Location: [facility];  Service: Orthopedics;  Laterality: Left;  . PORT-A-CATH REMOVAL Left 12/01/2010  . PORTACATH  PLACEMENT Left 09/12/2009  . REDUCTION MAMMAPLASTY Left 08/2013  . REMOVAL OF TISSUE EXPANDER AND PLACEMENT OF IMPLANT Right 08/23/2013   Procedure: REMOVAL RIGHT TISSUE EXPANDER AND PLACEMENT OF IMPLANT TO RIGHT BREAST ;  Surgeon: Theodoro Kos, DO;  Location: [facility];  Service: Plastics;  Laterality: Right;  . SUPRA-UMBILICAL HERNIA  [id number]  . TUBAL LIGATION   11/21/2008    MEDS:   Current Outpatient Medications on File Prior to Visit  Medication Sig Dispense Refill  . albuterol (PROAIR HFA) 108 (90 Base) MCG/ACT inhaler Inhale 2 puffs into the lungs every 6 (six) hours as needed for wheezing or shortness of breath. 18 g 3  . diclofenac sodium (VOLTAREN) 1 % GEL Apply 4 g topically 4 (four) times daily. Use as directed 3 Tube 4  . potassium chloride (MICRO-K) 10 MEQ CR capsule TAKE 1 CAPSULE (10 MEQ TOTAL) BY MOUTH 2 TIMES DAILY. 30 capsule 3  . tiZANidine (ZANAFLEX) 4 MG tablet TAKE ONE (1) TABLET BY MOUTH 3 TIMES DAILY (Patient taking differently: Take 4 mg by mouth every 8 (eight) hours as needed for muscle spasms. ) 90 tablet 3  . topiramate (TOPAMAX) 50 MG tablet TAKE 3 TABLETS (150 MG TOTAL) BY MOUTH AT BEDTIME. 90 tablet 1  . Erenumab-aooe 140 MG/ML SOAJ Inject 140 mg into the skin every 30 (thirty) days. (Patient not taking: Reported on 01/05/2019) 3 pen 3   No current facility-administered medications on file prior to visit.     ALLERGIES: Penicillins, Lyrica [pregabalin], Meloxicam, and Robaxin [methocarbamol]  Family History  Problem Relation Age of Onset  . Hypertension Mother   . Breast cancer Mother 57  . Diabetes type II Father   . Prostate cancer Father   . Stroke Neg Hx   . Colon polyps Neg Hx   . Esophageal cancer Neg Hx   . Pancreatic cancer Neg Hx   . Stomach cancer Neg Hx   . Rectal cancer Neg Hx     SH:  Single, non smoker  Review of Systems  Genitourinary: Positive for vaginal discharge.  All other systems reviewed and are negative.   PHYSICAL EXAMINATION:    BP 110/76   Pulse 80   Temp (!) 97.2 F (36.2 C) (Temporal)   Ht 5\' 4"  (1.626 m)   Wt 177 lb (80.3 kg)   LMP 12/15/2018 (Exact Date)   BMI 30.38 kg/m     General appearance: alert, cooperative and appears stated age Abdomen: soft, non-tender; bowel sounds normal; no masses,  no organomegaly Lymph:  no inguinal LAD noted  Pelvic: External  genitalia:  no lesions              Urethra:  normal appearing urethra with no masses, tenderness or lesions              Bartholins and Skenes: normal                 Vagina: normal appearing vagina with normal color, creamy vaginal discharge present, no lesions              Cervix: no lesions and ectropion is heling well              Bimanual Exam:  Uterus:  enlarged, 12 weeks size              Adnexa: no mass, fullness, tenderness  Chaperone was present for exam.  Assessment: LEEP 12/21/2018 Vaginal discharge with odor  Plan: Affirm pending Metrogel  9.68%, one applicator nightly x 5 nights to pharmacy. Diflucan 150mg  po x 1, repeat 72 hours. Advised pt not to have intercourse until after treatment is completed and symptoms resolve

## 2019-01-06 LAB — VAGINITIS/VAGINOSIS, DNA PROBE
Candida Species: NEGATIVE
Gardnerella vaginalis: POSITIVE — AB
Trichomonas vaginosis: NEGATIVE

## 2019-01-11 ENCOUNTER — Other Ambulatory Visit: Payer: Medicare Other | Admitting: Obstetrics & Gynecology

## 2019-01-11 ENCOUNTER — Telehealth: Payer: Self-pay | Admitting: Obstetrics & Gynecology

## 2019-01-11 ENCOUNTER — Other Ambulatory Visit: Payer: Self-pay | Admitting: *Deleted

## 2019-01-11 ENCOUNTER — Other Ambulatory Visit: Payer: Self-pay | Admitting: Emergency Medicine

## 2019-01-11 ENCOUNTER — Telehealth: Payer: Self-pay | Admitting: Gastroenterology

## 2019-01-11 ENCOUNTER — Ambulatory Visit: Payer: Medicare Other | Admitting: Gastroenterology

## 2019-01-11 ENCOUNTER — Other Ambulatory Visit: Payer: Medicare Other

## 2019-01-11 DIAGNOSIS — R634 Abnormal weight loss: Secondary | ICD-10-CM

## 2019-01-11 DIAGNOSIS — D251 Intramural leiomyoma of uterus: Secondary | ICD-10-CM

## 2019-01-11 DIAGNOSIS — R140 Abdominal distension (gaseous): Secondary | ICD-10-CM

## 2019-01-11 MED ORDER — NA SULFATE-K SULFATE-MG SULF 17.5-3.13-1.6 GM/177ML PO SOLN: 1.0000 | ORAL | 0 refills | Status: DC

## 2019-01-11 NOTE — Telephone Encounter (Signed)

## 2019-01-11 NOTE — Telephone Encounter (Signed)
Spoke with patient. PUS rescheduled to 01/25/19 at 2:30pm, consult at 3pm with Dr. Sabra Heck.   Routing to provider for final review. Patient is agreeable to disposition. Will close encounter.

## 2019-01-11 NOTE — Telephone Encounter (Signed)
Patient could not make it to 2 pm ultrasound and needs to reschedule.

## 2019-01-11 NOTE — Telephone Encounter (Signed)
Pt is scheduled for EGD/col tomorrow and has questions regarding prep.

## 2019-01-11 NOTE — Telephone Encounter (Signed)
Please call to reschedule pt.  Will route to Glorianne Manchester, RN, to call and reschedule pt.

## 2019-01-11 NOTE — Addendum Note (Signed)
Addended by: Wyline Beady on: 01/11/2019 10:31 AM   Modules accepted: Orders

## 2019-01-11 NOTE — Telephone Encounter (Signed)
Pt had questions regarding prep and eating prior to her procedure. Reviewed the instruction sheet with her. She verbalized understanding. SM

## 2019-01-12 ENCOUNTER — Encounter: Payer: Self-pay | Admitting: Gastroenterology

## 2019-01-12 ENCOUNTER — Ambulatory Visit (AMBULATORY_SURGERY_CENTER): Payer: Medicare Other | Admitting: Gastroenterology

## 2019-01-12 ENCOUNTER — Other Ambulatory Visit: Payer: Self-pay

## 2019-01-12 VITALS — BP 112/71 | HR 78 | Temp 98.3°F | Resp 11 | Ht 64.0 in | Wt 180.0 lb

## 2019-01-12 DIAGNOSIS — K2980 Duodenitis without bleeding: Secondary | ICD-10-CM | POA: Diagnosis not present

## 2019-01-12 DIAGNOSIS — D123 Benign neoplasm of transverse colon: Secondary | ICD-10-CM

## 2019-01-12 DIAGNOSIS — K317 Polyp of stomach and duodenum: Secondary | ICD-10-CM

## 2019-01-12 DIAGNOSIS — K2970 Gastritis, unspecified, without bleeding: Secondary | ICD-10-CM

## 2019-01-12 DIAGNOSIS — R634 Abnormal weight loss: Secondary | ICD-10-CM

## 2019-01-12 DIAGNOSIS — R140 Abdominal distension (gaseous): Secondary | ICD-10-CM

## 2019-01-12 DIAGNOSIS — Z1211 Encounter for screening for malignant neoplasm of colon: Secondary | ICD-10-CM | POA: Diagnosis not present

## 2019-01-12 DIAGNOSIS — B9681 Helicobacter pylori [H. pylori] as the cause of diseases classified elsewhere: Secondary | ICD-10-CM | POA: Diagnosis not present

## 2019-01-12 MED ORDER — SODIUM CHLORIDE 0.9 % IV SOLN: 500.0000 mL | Freq: Once | INTRAVENOUS | Status: DC

## 2019-01-12 NOTE — Progress Notes (Signed)
To PACU, VSS. Report to RN.tb 

## 2019-01-12 NOTE — Patient Instructions (Signed)
YOU HAD AN ENDOSCOPIC PROCEDURE TODAY AT THE [facility]:   Refer to the procedure report that was given to you for any specific questions about what was found during the examination.  If the procedure report does not answer your questions, please call your gastroenterologist to clarify.  If you requested that your care partner not be given the details of your procedure findings, then the procedure report has been included in a sealed envelope for you to review at your convenience later.  YOU SHOULD EXPECT: Some feelings of bloating in the abdomen. Passage of more gas than usual.  Walking can help get rid of the air that was put into your GI tract during the procedure and reduce the bloating. If you had a lower endoscopy (such as a colonoscopy or flexible sigmoidoscopy) you may notice spotting of blood in your stool or on the toilet paper. If you underwent a bowel prep for your procedure, you may not have a normal bowel movement for a few days.  Please Note:  You might notice some irritation and congestion in your nose or some drainage.  This is from the oxygen used during your procedure.  There is no need for concern and it should clear up in a day or so.  SYMPTOMS TO REPORT IMMEDIATELY:   Following lower endoscopy (colonoscopy or flexible sigmoidoscopy):  Excessive amounts of blood in the stool  Significant tenderness or worsening of abdominal pains  Swelling of the abdomen that is new, acute  Fever of 100F or higher   Following upper endoscopy (EGD)  Vomiting of blood or coffee ground material  New chest pain or pain under the shoulder blades  Painful or persistently difficult swallowing  New shortness of breath  Fever of 100F or higher  Black, tarry-looking stools  For urgent or emergent issues, a gastroenterologist can be reached at any hour by calling [phone].   DIET:  We do recommend a small meal at first, but then you may proceed to your regular diet.  Drink  plenty of fluids but you should avoid alcoholic beverages for 24 hours.  ACTIVITY:  You should plan to take it easy for the rest of today and you should NOT DRIVE or use heavy machinery until tomorrow (because of the sedation medicines used during the test).    FOLLOW UP: Our staff will call the number listed on your records 48-72 hours following your procedure to check on you and address any questions or concerns that you may have regarding the information given to you following your procedure. If we do not reach you, we will leave a message.  We will attempt to reach you two times.  During this call, we will ask if you have developed any symptoms of COVID 19. If you develop any symptoms (ie: fever, flu-like symptoms, shortness of breath, cough etc.) before then, please call [phone].  If you test positive for Covid 19 in the 2 weeks post procedure, please call and report this information to us.    If any biopsies were taken you will be contacted by phone or by letter within the next 1-3 weeks.  Please call us at [phone] if you have not heard about the biopsies in 3 weeks.    SIGNATURES/CONFIDENTIALITY: You and/or your care partner have signed paperwork which will be entered into your electronic medical record.  These signatures attest to the fact that that the information above on your After Visit Summary has been reviewed and is   understood.  Full responsibility of the confidentiality of this discharge information lies with you and/or your care-partner. 

## 2019-01-12 NOTE — Progress Notes (Signed)
Called to room to assist during endoscopic procedure.  Patient ID and intended procedure confirmed with present staff. Received instructions for my participation in the procedure from the performing physician.  

## 2019-01-12 NOTE — Op Note (Signed)
[facility] Patient Name: Karen Hopkins Procedure Date: 01/12/2019 3:30 PM MRN: [id number] Endoscopist: Thornton Park MD, MD Age: 42 Referring MD:  Date of Birth: 10/10/1975 Gender: Female Account #: [id number] Procedure:                Upper GI endoscopy Indications:              Abdominal bloating x years                           - no change with food intake, defecation, or                            movement                           - normal liver enzymes and lipase                           Unintentional weight loss of 40 pounds in 2-3 months                           - albumin 4.3 10/18/18                           - normal CBC 10/18/18 Medicines:                See the Anesthesia note for documentation of the                            administered medications Procedure:                Pre-Anesthesia Assessment:                           - Prior to the procedure, a History and Physical                            was performed, and patient medications and                            allergies were reviewed. The patient's tolerance of                            previous anesthesia was also reviewed. The risks                            and benefits of the procedure and the sedation                            options and risks were discussed with the patient.                            All questions were answered, and informed consent                            was obtained. Prior  Anticoagulants: The patient has                            taken no previous anticoagulant or antiplatelet                            agents. ASA Grade Assessment: II - A patient with                            mild systemic disease. After reviewing the risks                            and benefits, the patient was deemed in                            satisfactory condition to undergo the procedure.                           After obtaining informed consent, the endoscope was            passed under direct vision. Throughout the                            procedure, the patient's blood pressure, pulse, and                            oxygen saturations were monitored continuously. The                            Endoscope was introduced through the mouth, and                            advanced to the third part of duodenum. The upper                            GI endoscopy was accomplished without difficulty.                            The patient tolerated the procedure well. Scope In: Scope Out: Findings:                 The examined esophagus was normal. Biopsies were                            taken with a cold forceps for histology. Estimated                            blood loss was minimal.                           Diffuse mild inflammation characterized by                            erythema, friability and granularity was found in  the gastric body. Biopsies were taken with a cold                            forceps for histology. Estimated blood loss was                            minimal.                           Diffuse mildly erythematous mucosa was found in the                            duodenal bulb. Biopsies were taken with a cold                            forceps for histology. Estimated blood loss was                            minimal.                           A few small sessile polyps with no bleeding and no                            stigmata of recent bleeding were found in the                            gastric fundus. These polyps have the appearance of                            fundic gland polyps. Biopsies were taken with a                            cold forceps for histology. Complications:            No immediate complications. Estimated blood loss:                            Minimal. Estimated Blood Loss:     Estimated blood loss was minimal. Impression:               - Normal esophagus. Biopsied.                            - Gastritis. Biopsied.                           - Erythematous duodenopathy. Biopsied.                           - A few gastric polyps. Biopsied. Recommendation:           - Patient has a contact number available for                            emergencies. The signs and symptoms of potential  delayed complications were discussed with the                            patient. Return to normal activities tomorrow.                            Written discharge instructions were provided to the                            patient.                           - Resume previous diet today.                           - Continue present medications.                           - No aspirin, ibuprofen, naproxen, or other                            non-steroidal anti-inflammatory drugs.                           - Await pathology results.                           - Proceed with colonoscopy as previously planned. Thornton Park MD, MD 01/12/2019 4:15:35 PM This report has been signed electronically.

## 2019-01-12 NOTE — Op Note (Signed)
[facility] Patient Name: Karen Hopkins Procedure Date: 01/12/2019 3:29 PM MRN: [id number] Endoscopist: Thornton Park MD, MD Age: 43 Referring MD:  Date of Birth: 1975-08-24 Gender: Female Account #: [id number] Procedure:                Colonoscopy Indications:              Weight loss                           Abdominal bloating x years                           - no change with food intake, defecation, or                            movement                           - normal liver enzymes and lipase                           Unintentional weight loss of 40 pounds in 2-3 months                           - albumin 4.3 10/18/18                           - normal CBC 10/18/18                           No known family history of colon cancer or polyps. Medicines:                See the Anesthesia note for documentation of the                            administered medications Procedure:                Pre-Anesthesia Assessment:                           - Prior to the procedure, a History and Physical                            was performed, and patient medications and                            allergies were reviewed. The patient's tolerance of                            previous anesthesia was also reviewed. The risks                            and benefits of the procedure and the sedation                            options and risks were discussed with the patient.  All questions were answered, and informed consent                            was obtained. Prior Anticoagulants: The patient has                            taken no previous anticoagulant or antiplatelet                            agents. ASA Grade Assessment: II - A patient with                            mild systemic disease. After reviewing the risks                            and benefits, the patient was deemed in                            satisfactory condition to undergo  the procedure.                           After obtaining informed consent, the colonoscope                            was passed under direct vision. Throughout the                            procedure, the patient's blood pressure, pulse, and                            oxygen saturations were monitored continuously. The                            Colonoscope was introduced through the anus and                            advanced to the the terminal ileum, with                            identification of the appendiceal orifice and IC                            valve. A second forward view of the right colon was                            performed. The colonoscopy was performed without                            difficulty. The patient tolerated the procedure                            well. The quality of the bowel preparation was  excellent. The terminal ileum, ileocecal valve,                            appendiceal orifice, and rectum were photographed. Scope In: 3:49:17 PM Scope Out: 4:05:56 PM Scope Withdrawal Time: 0 hours 12 minutes 20 seconds  Total Procedure Duration: 0 hours 16 minutes 39 seconds  Findings:                 The perianal and digital rectal examinations were                            normal.                           Non-bleeding internal hemorrhoids were found. The                            hemorrhoids were small.                           A 2 mm polyp was found in the hepatic flexure. The                            polyp was sessile. The polyp was removed with a                            cold snare. Resection and retrieval were complete.                            Estimated blood loss was minimal.                           Two sessile polyps were found in the transverse                            colon. The polyps were 1 to 2 mm in size. These                            polyps were removed with a cold biopsy forceps.                             Resection and retrieval were complete. Estimated                            blood loss was minimal.                           The exam was otherwise without abnormality on                            direct and retroflexion views. Complications:            No immediate complications. Estimated blood loss:  Minimal. Estimated Blood Loss:     Estimated blood loss was minimal. Impression:               - Non-bleeding internal hemorrhoids.                           - One 2 mm polyp at the hepatic flexure, removed                            with a cold snare. Resected and retrieved.                           - Two 1 to 2 mm polyps in the transverse colon,                            removed with a cold biopsy forceps. Resected and                            retrieved.                           - The examination was otherwise normal on direct                            and retroflexion views. Recommendation:           - Patient has a contact number available for                            emergencies. The signs and symptoms of potential                            delayed complications were discussed with the                            patient. Return to normal activities tomorrow.                            Written discharge instructions were provided to the                            patient.                           - Resume previous diet today.                           - Continue present medications.                           - Await pathology results.                           - Repeat colonoscopy date to be determined after                            pending  pathology results are reviewed for                            surveillance based on pathology results. If all 3                            polyps are adenomas, repeat colonoscopy in 3 years.                            If one or two polyps are adenomas, repeat                            colonoscopy  in 7 years. Thornton Park MD, MD 01/12/2019 4:19:52 PM This report has been signed electronically.

## 2019-01-16 ENCOUNTER — Telehealth: Payer: Self-pay | Admitting: *Deleted

## 2019-01-16 NOTE — Telephone Encounter (Signed)
1. Have you developed a fever since your procedure? No  2.   Have you had an respiratory symptoms (SOB or cough) since your procedure? no 3.   Have you tested positive for COVID 19 since your procedure no  4.   Have you had any family members/close contacts diagnosed with the COVID 19 since your procedure?  no   If yes to any of these questions please route to Joylene John, RN and Alphonsa Gin, Therapist, sports.  Follow up Call-  Call back number 01/12/2019  Post procedure Call Back phone  # [phone]  Permission to leave phone message Yes  Some recent data might be hidden     Patient questions:  Do you have a fever, pain , or abdominal swelling? No. Pain Score  0 *  Have you tolerated food without any problems? Yes.    Have you been able to return to your normal activities? Yes.    Do you have any questions about your discharge instructions: Diet   No. Medications  No. Follow up visit  No.  Do you have questions or concerns about your Care? No.  Actions: * If pain score is 4 or above: No action needed, pain <4.

## 2019-01-18 ENCOUNTER — Other Ambulatory Visit: Payer: Medicare Other | Admitting: Obstetrics & Gynecology

## 2019-01-18 ENCOUNTER — Other Ambulatory Visit: Payer: Medicare Other

## 2019-01-19 MED FILL — POTASSIUM CL ER 10 MEQ CAP: 10 | 15 days supply | Qty: 30 | Fill #3

## 2019-01-24 ENCOUNTER — Encounter: Payer: Self-pay | Admitting: Gastroenterology

## 2019-01-24 DIAGNOSIS — M25672 Stiffness of left ankle, not elsewhere classified: Secondary | ICD-10-CM | POA: Diagnosis not present

## 2019-01-25 ENCOUNTER — Other Ambulatory Visit: Payer: Medicare Other

## 2019-01-25 ENCOUNTER — Encounter: Payer: Self-pay | Admitting: *Deleted

## 2019-01-25 ENCOUNTER — Telehealth: Payer: Self-pay | Admitting: *Deleted

## 2019-01-25 ENCOUNTER — Other Ambulatory Visit: Payer: Self-pay | Admitting: Obstetrics & Gynecology

## 2019-01-25 NOTE — Telephone Encounter (Signed)
Spoke with patient. Patient reports menses like cramps 7/10, unrelieved by OTC tylenol. States she does not like to take motrin due to GI symptoms. LMP 12/18/18, hx of BTL. Menses has not started to date. Reports scant amount of brown vaginal d/c. No odor. Advised PUS with consult recommended for further evaluation. Patient declined PUS on 8/25, has another appt. PUS rescheduled for 8/27at 4pm, consult at 4:30pm with Dr. Sabra Heck. ER precautions reviewed for new or worsening symptoms. Advised I will forward to Dr. Sabra Heck to review, will return call if any additional recommendations.   Dr. Sabra Heck -please review.

## 2019-01-25 NOTE — Telephone Encounter (Signed)
Patient called after appointment time of 2:30 saying that she was on the way. Ask if someone can call back to reschedule because she is still having problems with her period.

## 2019-01-25 NOTE — Telephone Encounter (Signed)
Call reviewed with Dr. Sabra Heck, call returned to patient. Advised to keep PUS as scheduled for further evaluation with Dr. Sabra Heck. Reviewed with patient treatment options such as myomectomy, [place] and hysterectomy, that have been discussed in the past for fibroids. Patient verbalizes understanding, will further discuss with Dr. Sabra Heck on 8/27.   Routing to provider for final review. Patient is agreeable to disposition. Will close encounter.  Cc: Thayer Ohm, Don Broach

## 2019-01-29 DIAGNOSIS — R531 Weakness: Secondary | ICD-10-CM | POA: Diagnosis not present

## 2019-01-29 DIAGNOSIS — S82852D Displaced trimalleolar fracture of left lower leg, subsequent encounter for closed fracture with routine healing: Secondary | ICD-10-CM | POA: Diagnosis not present

## 2019-01-29 DIAGNOSIS — M25672 Stiffness of left ankle, not elsewhere classified: Secondary | ICD-10-CM | POA: Diagnosis not present

## 2019-01-30 ENCOUNTER — Other Ambulatory Visit: Payer: Self-pay

## 2019-01-30 ENCOUNTER — Ambulatory Visit (INDEPENDENT_AMBULATORY_CARE_PROVIDER_SITE_OTHER): Payer: Medicare Other | Admitting: Gastroenterology

## 2019-01-30 ENCOUNTER — Encounter: Payer: Self-pay | Admitting: Gastroenterology

## 2019-01-30 VITALS — BP 114/60 | HR 86 | Temp 98.2°F | Ht 64.0 in | Wt 178.0 lb

## 2019-01-30 DIAGNOSIS — R140 Abdominal distension (gaseous): Secondary | ICD-10-CM | POA: Diagnosis not present

## 2019-01-30 DIAGNOSIS — Z8619 Personal history of other infectious and parasitic diseases: Secondary | ICD-10-CM | POA: Diagnosis not present

## 2019-01-30 MED ORDER — CLARITHROMYCIN 500 MG PO TABS: 500.0000 mg | ORAL_TABLET | Freq: Two times a day (BID) | ORAL | 0 refills | Status: DC

## 2019-01-30 MED ORDER — METRONIDAZOLE 500 MG PO TABS: 500.0000 mg | ORAL_TABLET | Freq: Three times a day (TID) | ORAL | 0 refills | Status: DC

## 2019-01-30 MED ORDER — OMEPRAZOLE 40 MG PO CPDR: 40.0000 mg | DELAYED_RELEASE_CAPSULE | Freq: Two times a day (BID) | ORAL | 0 refills | Status: DC

## 2019-01-30 NOTE — Patient Instructions (Addendum)
I have recommended treatment for H pylori, the infection in your stomach that can cause ulcer. This may improve your bloating. Come back in 8 weeks to have H pylori testing done. While on the treatment for H pylori you need to take a medication called Omeprazole to protect your stomach.   Let's try Linzess 145 mcg every day to see if this will improve your bloating and constipation if your symptoms do not improve with treatment of H pylori.   Given your history of that precancerous polyp, you should have another colonoscopy in 7 years. Given your history of this polyp, your sister should have a colonoscopy.   Let's plan to follow-up in 2 months. Please call with any questions or concerns prior to that time.

## 2019-01-30 NOTE — Progress Notes (Signed)
Referring Provider: [name], MD Primary Care Physician:  [name], MD    Chief complaint: Bloating, epigastric pain, and weight loss   IMPRESSION:  H pylori gastritis on EGD 01/12/2019 Abdominal bloating x years    - no change with food intake, defecation, or movement    - normal liver enzymes and lipase    -No significant change in symptoms with IBgard Unintentional weight loss of 40 pounds in 2-3 months, now stable    - albumin 4.3 10/18/18    - normal CBC 10/18/18 Chronic constipation BMI of 33 4.5 cm uterine fibroid seen on transvaginal ultrasound 10/18/18    - Dr. Sabra Heck did not think this was the cause of her symptoms Personal history of colon polyps    -Tubular adenoma removed on colonoscopy 01/12/2019    -Surveillance colonoscopy recommended in 2027  Recent endoscopic evaluation shows H. pylori gastritis.  I am hopeful that with treatment some of her symptoms will improve.  However, her bloating may be most related to chronic constipation.  If symptoms persist despite successful treatment of H. pylori we will start a trial of Linzess 145 mcg daily.  Samples provided today.  We will continue close monitoring for further weight loss   PLAN: - H. Pylori treatment: Metronidazole 500 mg TID for 10 days.  Take Clarithromycin 500 mg twice a day for 10 days. PPI twice a day for 10 days then decrease it to once per day  - Eight weeks after completing the treatment, an H pylori stool antigen should be performed to monitor for response to treatment.  Return to the office after completing treatment. - Trial of Linzess 145 mcg daily  - Follow-up in 2 months, or earlier as needed - Surveillance colonoscopy in 7 years  HPI: Karen Hopkins is a 43 y.o. disabled woman under evaluation of bloating and weight loss.  The interval history is obtained through the patient and review of her electronic health record. Disabled for her history of locally advanced right breast  cancer.   A transabdominal and transvaginal ultrasound of the pelvis was performed 10/18/2018 to evaluate lower abdominal pain and right adnexal tenderness this revealed a 4.5 cm uterine fibroid was otherwise normal.   Labs from 10/18/2018 show a normal comprehensive metabolic panel except for a potassium of 2.9, chloride 16, bicarb 19.  In particular her liver enzymes were normal with an AST 12, ALT 11, alk phos 73, total bilirubin 0.6.  Lipase was normal at 37.  Normal CBC with a white count of 7.2, hemoglobin 13.6, platelets 252.  She did CT of the abdomen and pelvis with contrast 11/15/2018 showing patchy and homogenous nephrogram of the right kidney with minimal perinephric edema and delay in nephrogram associated with right ureteral dilatation.  There was also an umbilical hernia containing fat.  A right upper quadrant abdominal ultrasound 11/17/2018 was normal.  She had an EGD and colonoscopy 01/12/2019 that showed gastritis and duodenitis.  Duodenal biopsies confirmed peptic duodenitis.  Gastric biopsies showed H. pylori associated gastritis.  She had gastric hyperplastic polyps.  Colonoscopy showed 3 small polyps and internal hemorrhoids.  A tubular adenoma was removed.  The other polyps were hyperplastic polyps or polypoid colonic mucosa.  Boating persists.  No change in symptoms with FD guard.  Bloating may be exacerbated by eating. No diarrhea. Occasionally feels constipated. Having one BM every 2 days to every few weeks but feels like she would feel better with more frequent bowel  movements. Relieved with defecation. No nausea or vomiting.   Eating better. Weight is the same.   No new complaints or concerns  Mother with a history of GI problems, but the details are unknown. Sister had a normal screening colonoscopy at age 29.  Father with prostate. No known family history of colon cancer or polyps. No family history of uterine/endometrial cancer, pancreatic cancer or gastric/stomach  cancer.  Past Medical History:  Diagnosis Date   Anxiety    Arthritis    Cancer ([place]) 2011   Rt. Br. Ca   Depression    Diabetes mellitus without complication ([place])    has been "borderline"   Essential hypertension 11/05/2015   History of breast cancer 2011   right   History of chemotherapy 2011   History of radiation therapy 2011   Hypertension    under control with med., has been on med. x 2 yr.   Lymphedema of arm    right; no BP or puncture to right arm   Personal history of chemotherapy 09/16/2009   rt breast   Personal history of radiation therapy 05/2010   rt breast   Pneumonia    Seasonal allergies    Sinus headache     Past Surgical History:  Procedure Laterality Date   BREAST BIOPSY Right 08/26/2009   BREAST REDUCTION SURGERY Left 08/23/2013   Procedure: LEFT BREAST REDUCTION  ;  Surgeon: Theodoro Kos, DO;  Location: [facility];  Service: Plastics;  Laterality: Left;   CESAREAN SECTION  07/15/2001; 03/18/2004; 11/21/2008   lateral orbiotomy Left 11/2015   Dr. Toy Cookey at [facility] Right 03/12/2013   Procedure: RIGHT BREAST LATISSIMUS FLAP WITH EXPANDER PLACEMENT;  Surgeon: Theodoro Kos, DO;  Location: [facility];  Service: Plastics;  Laterality: Right;   LIPOSUCTION Bilateral 08/23/2013   Procedure: LIPOSUCTION;  Surgeon: Theodoro Kos, DO;  Location: [facility];  Service: Plastics;  Laterality: Bilateral;   MASTECTOMY Right 2011   MODIFIED RADICAL MASTECTOMY Right 03/11/2010   NASAL SEPTUM SURGERY     ORIF ANKLE FRACTURE Left 07/18/2018   Procedure: OPEN REDUCTION INTERNAL FIXATION (ORIF) LEFT ANKLE FRACTURE WITH  SYNDESMOSIS AND ANKLE ARTHROTOMY;  Surgeon: Erle Crocker, MD;  Location: [facility];  Service: Orthopedics;  Laterality: Left;   PORT-A-CATH REMOVAL Left 12/01/2010   PORTACATH PLACEMENT Left 09/12/2009   REDUCTION MAMMAPLASTY Left 08/2013   REMOVAL OF TISSUE  EXPANDER AND PLACEMENT OF IMPLANT Right 08/23/2013   Procedure: REMOVAL RIGHT TISSUE EXPANDER AND PLACEMENT OF IMPLANT TO RIGHT BREAST ;  Surgeon: Theodoro Kos, DO;  Location: [facility];  Service: Plastics;  Laterality: Right;   SUPRA-UMBILICAL HERNIA  1610   TUBAL LIGATION  11/21/2008    Current Outpatient Medications  Medication Sig Dispense Refill   albuterol (PROAIR HFA) 108 (90 Base) MCG/ACT inhaler Inhale 2 puffs into the lungs every 6 (six) hours as needed for wheezing or shortness of breath. 18 g 3   diclofenac sodium (VOLTAREN) 1 % GEL Apply 4 g topically 4 (four) times daily. Use as directed 3 Tube 4   fluconazole (DIFLUCAN) 150 MG tablet Take 1 tab po x 1, repeat in 72 hours 2 tablet 0   metroNIDAZOLE (METROGEL) 0.75 % vaginal gel Place 1 Applicatorful vaginally at bedtime. Use for five nights. 70 g 0   potassium chloride (MICRO-K) 10 MEQ CR capsule TAKE 1 CAPSULE (10 MEQ TOTAL) BY MOUTH 2 TIMES DAILY. 30 capsule  3   tiZANidine (ZANAFLEX) 4 MG tablet TAKE ONE (1) TABLET BY MOUTH 3 TIMES DAILY (Patient taking differently: Take 4 mg by mouth every 8 (eight) hours as needed for muscle spasms. ) 90 tablet 3   topiramate (TOPAMAX) 50 MG tablet TAKE 3 TABLETS (150 MG TOTAL) BY MOUTH AT BEDTIME. 90 tablet 1   No current facility-administered medications for this visit.     Allergies as of 01/30/2019 - Review Complete 01/12/2019  Allergen Reaction Noted   Penicillins Swelling 04/14/2010   Lyrica [pregabalin] Nausea Only 01/19/2012   Meloxicam Nausea Only 12/22/2011   Robaxin [methocarbamol] Nausea Only 12/22/2011    Family History  Problem Relation Age of Onset   Hypertension Mother    Breast cancer Mother 1   Diabetes type II Father    Prostate cancer Father    Stroke Neg Hx    Colon polyps Neg Hx    Esophageal cancer Neg Hx    Pancreatic cancer Neg Hx    Stomach cancer Neg Hx    Rectal cancer Neg Hx     Social History    Socioeconomic History   Marital status: Single    Spouse name: Not on file   Number of children: 3   Years of education: 16   Highest education level: Not on file  Occupational History   Occupation: Disability   Social Designer, fashion/clothing strain: Not on file   Food insecurity    Worry: Not on file    Inability: Not on file   Transportation needs    Medical: Not on file    Non-medical: Not on file  Tobacco Use   Smoking status: Current Some Day Smoker    Packs/day: 3.00    Years: 5.00    Pack years: 15.00    Types: Cigarettes    Start date: 06/07/2002    Last attempt to quit: 04/26/2010    Years since quitting: 8.7   Smokeless tobacco: Never Used  Substance and Sexual Activity   Alcohol use: No   Drug use: No   Sexual activity: Yes    Birth control/protection: Surgical  Lifestyle   Physical activity    Days per week: Not on file    Minutes per session: Not on file   Stress: Not on file  Relationships   Social connections    Talks on phone: Not on file    Gets together: Not on file    Attends religious service: Not on file    Active member of club or organization: Not on file    Attends meetings of clubs or organizations: Not on file    Relationship status: Not on file   Intimate partner violence    Fear of current or ex partner: Not on file    Emotionally abused: Not on file    Physically abused: Not on file    Forced sexual activity: Not on file  Other Topics Concern   Not on file  Social History Narrative   Lives with kids   Caffeine use: none      Physical Exam: General: in no acute distress Neuro: Alert and appropriate Psych: Normal affect and normal insight Abd: Soft, NT, ND, normal bowel sounds, no obvious abdominal wall hernia   Abas Leicht L. Tarri Glenn, MD, MPH [facility] 01/30/2019, 1:42 PM

## 2019-01-31 DIAGNOSIS — R531 Weakness: Secondary | ICD-10-CM | POA: Diagnosis not present

## 2019-01-31 DIAGNOSIS — S82852D Displaced trimalleolar fracture of left lower leg, subsequent encounter for closed fracture with routine healing: Secondary | ICD-10-CM | POA: Diagnosis not present

## 2019-01-31 DIAGNOSIS — M25672 Stiffness of left ankle, not elsewhere classified: Secondary | ICD-10-CM | POA: Diagnosis not present

## 2019-02-01 ENCOUNTER — Ambulatory Visit (INDEPENDENT_AMBULATORY_CARE_PROVIDER_SITE_OTHER): Payer: Medicare Other | Admitting: Obstetrics & Gynecology

## 2019-02-01 ENCOUNTER — Other Ambulatory Visit: Payer: Self-pay

## 2019-02-01 ENCOUNTER — Ambulatory Visit (INDEPENDENT_AMBULATORY_CARE_PROVIDER_SITE_OTHER): Payer: Medicare Other

## 2019-02-01 ENCOUNTER — Encounter: Payer: Self-pay | Admitting: Obstetrics & Gynecology

## 2019-02-01 VITALS — BP 122/80 | HR 88 | Temp 97.2°F | Ht 64.0 in | Wt 177.0 lb

## 2019-02-01 DIAGNOSIS — N857 Hematometra: Secondary | ICD-10-CM | POA: Diagnosis not present

## 2019-02-01 DIAGNOSIS — N912 Amenorrhea, unspecified: Secondary | ICD-10-CM | POA: Diagnosis not present

## 2019-02-01 DIAGNOSIS — D251 Intramural leiomyoma of uterus: Secondary | ICD-10-CM | POA: Diagnosis not present

## 2019-02-01 LAB — POCT URINE PREGNANCY: Preg Test, Ur: NEGATIVE

## 2019-02-01 NOTE — Progress Notes (Signed)
43 y.o. [id number] [name] here for pelvic ultrasound due to recheck possible fibroid and evaluate for additional causes of menorrhagia.  Pt did not have a cycle this past month although she reports significant pain/cramping with cycle.  UPT is negative today.  She .  Patient's last menstrual period was 12/18/2018.  Contraception: BLT  Findings:  UTERUS: 10.8 x 5.2 x 4.5cm with echogenic fluid present from cervix to fundus, possible hematometra EMS: 4.37mm ADNEXA: Left ovary: 4.2 x 3.2 x 2.7cm with 4.1 x 2.9 x 3.2cm simple ovarian cyst       Right ovary: 2.9 x 1.5 x 1.7cm CUL DE SAC: no free fluid  Discussion:  Findings reviewed with pt.  She is aware that there is no fibroid.  Prior images were measuring the fundus of the uterus.  Treatment options of possible IUD use but I would need to clear this with her oncologist as well as endometrial ablation.  She is not interested in any other hormonal therapy or a hysterectomy at this time.  Pt and I discussed additional findings.  Ovary has simple appearance.  Follow up PUS not needed unless she has new onset of pain as ovarian cyst was not present with last PUS.  Possible hematometra noted.  She has undergone LEEP procedure and this could be a result of that.  Recommended she allow me to see if I can dilate cervix.  Verbal consent obtained.  Speculum placed.  Cervix cleansed with Betadine x 3.  Anterior lip of cervix grasped with single toothed tenaculum.  Small dilator passed through os with dark blood present c/w hematometra.  Cervix was dilated further to help prevent future scarring.  Assessment:  H/o menorrhagia with negative endometrial biopsy 11/02/2018 Hematometra that was relieved today S/p LEEP 12/21/2018   Plan:  She is going to consider options.  She knows to call me about her next cycle and specifically if it does not occur.    ~25 minutes spent with patient >50% of time was in face to face discussion of  above.

## 2019-02-03 ENCOUNTER — Other Ambulatory Visit: Payer: Self-pay | Admitting: Oncology

## 2019-02-05 MED FILL — POTASSIUM CL ER 10 MEQ CAP: 10 | 15 days supply | Qty: 30 | Fill #0

## 2019-02-06 DIAGNOSIS — S82852D Displaced trimalleolar fracture of left lower leg, subsequent encounter for closed fracture with routine healing: Secondary | ICD-10-CM | POA: Diagnosis not present

## 2019-02-06 DIAGNOSIS — R531 Weakness: Secondary | ICD-10-CM | POA: Diagnosis not present

## 2019-02-06 DIAGNOSIS — M25672 Stiffness of left ankle, not elsewhere classified: Secondary | ICD-10-CM | POA: Diagnosis not present

## 2019-02-08 ENCOUNTER — Other Ambulatory Visit: Payer: Self-pay | Admitting: Obstetrics & Gynecology

## 2019-02-08 ENCOUNTER — Encounter: Payer: Self-pay | Admitting: Obstetrics & Gynecology

## 2019-02-08 ENCOUNTER — Telehealth: Payer: Self-pay | Admitting: Gastroenterology

## 2019-02-08 DIAGNOSIS — R531 Weakness: Secondary | ICD-10-CM | POA: Diagnosis not present

## 2019-02-08 DIAGNOSIS — M25672 Stiffness of left ankle, not elsewhere classified: Secondary | ICD-10-CM | POA: Diagnosis not present

## 2019-02-08 DIAGNOSIS — S82852D Displaced trimalleolar fracture of left lower leg, subsequent encounter for closed fracture with routine healing: Secondary | ICD-10-CM | POA: Diagnosis not present

## 2019-02-08 NOTE — Telephone Encounter (Signed)
Spoke with patient. Patient states she is on Flagyl for H. Pylori, prescribed by GI. Reports white vaginal d/c with itching, no odor, started 2 days ago. Patient sates she f/u with GI and was advised to f/u with GYN for tx recommendations. Patient states she has not tried OTC monistat, has not worked in the past, usually get RX for diflucan with Flagyl. Pharmacy on file verified.   Advised I will forward to Dr. Sabra Heck to review, will notify of recommendations. Patient agreeable.   Rx pended.   Dr. Sabra Heck -please advise on diflucan Rx.

## 2019-02-08 NOTE — Telephone Encounter (Signed)
The patient is now calling about the Diflucan prescription. Can this patient be prescribed 2 tablets of Diflucan per her earlier patient message. Please advise.

## 2019-02-08 NOTE — Telephone Encounter (Signed)
Spoke to the patient who reported she has been on metronidazole several times and always gets a yeast infection. She was told to call her PCP or GYN for treatment. The patient also reported that she has not been taking the h pylori treatment as prescribed due to her GI upset. This RN explained when patients do this, the bacteria could become resistant to the tx. The patient said she would resume the medication today as prescribed and would tolerate the unpleasant side effects. Staff message sent to myself to call the patient in for h pylori stool antigen 8 weeks after tx. Patient aware.

## 2019-02-08 NOTE — Telephone Encounter (Signed)
My apologies for the delay without a responding to her reuqest this morning. I am out of the office.   Metronidazole is not routinely associated with yeast infections. It would be best to review her concerns with her PMD to be sure that we are treating her the best way possible. Thank you.

## 2019-02-08 NOTE — Telephone Encounter (Signed)
Patient sent the following message through [place]. Routing to triage to assist patient with request.  Garfinkel, Gay  Phone Number: [phone]        Hello  I was wondering if I can get 2 of the yeast infection pills prescriptions the metronidazole the GI doc prescribed is giving me a yeast infection I was told to contact my gynecologist thank you

## 2019-02-08 NOTE — Telephone Encounter (Signed)
Thank you for the follow-up. We definitely want her feeling better, safely, and to successfully treat her H pylori.

## 2019-02-09 MED ORDER — FLUCONAZOLE 150 MG PO TABS: ORAL_TABLET | ORAL | 0 refills | Status: DC

## 2019-02-09 NOTE — Telephone Encounter (Signed)
Ok to send in rx for diflucan 150mg  po x 1, repeat 72 hours.  #2/0RF.  She needs OV if does not resolve.

## 2019-02-09 NOTE — Telephone Encounter (Signed)
Diflucan Rx to pharmacy.   Call to patient to notify. No answer, mailbox full.   MyChart message to patient to notify.   Encounter closed.

## 2019-02-17 MED FILL — POTASSIUM CL ER 10 MEQ CAP: 10 | 15 days supply | Qty: 30 | Fill #1

## 2019-02-20 ENCOUNTER — Telehealth: Payer: Self-pay | Admitting: Obstetrics & Gynecology

## 2019-02-20 DIAGNOSIS — M25672 Stiffness of left ankle, not elsewhere classified: Secondary | ICD-10-CM | POA: Diagnosis not present

## 2019-02-20 DIAGNOSIS — N857 Hematometra: Secondary | ICD-10-CM

## 2019-02-20 DIAGNOSIS — R1030 Lower abdominal pain, unspecified: Secondary | ICD-10-CM

## 2019-02-20 DIAGNOSIS — R531 Weakness: Secondary | ICD-10-CM | POA: Diagnosis not present

## 2019-02-20 DIAGNOSIS — R102 Pelvic and perineal pain: Secondary | ICD-10-CM

## 2019-02-20 DIAGNOSIS — S82852D Displaced trimalleolar fracture of left lower leg, subsequent encounter for closed fracture with routine healing: Secondary | ICD-10-CM | POA: Diagnosis not present

## 2019-02-20 DIAGNOSIS — N912 Amenorrhea, unspecified: Secondary | ICD-10-CM

## 2019-02-20 NOTE — Telephone Encounter (Signed)
Patient says she is cramping but her cycle is not on.

## 2019-02-20 NOTE — Telephone Encounter (Signed)
Call reviewed with Dr. Sabra Heck, call returned to patient. PUS scheduled for 9/17 at 3pm, consult at 3:15pm with Dr. Sabra Heck. [id number] precautions reviewed, prescreen negative. Order placed for precert. Patient verbalizes understanding and is agreeable to plan.   Routing to provider for final review. Patient is agreeable to disposition. Will close encounter.  Cc: Lerry Liner, Magdalene Patricia

## 2019-02-20 NOTE — Telephone Encounter (Signed)
Spoke with patient. LMP 12/18/18. BTL for contraceptive. Patient states she is cramping but menses has not started. Lower abdomen and pelvis.  Pain 8/10, no relieved by OTC tylenol. Was advised to call if menses did not start. Denies vaginal d/c, odor, N/V, fever/chills. Patient was seen in office on 02/01/19, PUS completed, hematometra noted. S/p LEEP 12/21/18.   Advised patient I will have to review with Dr. Sabra Heck and return call with recommendations, patient agreeable.

## 2019-02-22 ENCOUNTER — Ambulatory Visit (INDEPENDENT_AMBULATORY_CARE_PROVIDER_SITE_OTHER): Payer: Medicare Other | Admitting: Obstetrics & Gynecology

## 2019-02-22 ENCOUNTER — Ambulatory Visit (INDEPENDENT_AMBULATORY_CARE_PROVIDER_SITE_OTHER): Payer: Medicare Other

## 2019-02-22 ENCOUNTER — Other Ambulatory Visit: Payer: Self-pay

## 2019-02-22 VITALS — BP 102/62 | HR 84 | Temp 97.3°F | Ht 64.0 in | Wt 178.0 lb

## 2019-02-22 DIAGNOSIS — R102 Pelvic and perineal pain: Secondary | ICD-10-CM

## 2019-02-22 DIAGNOSIS — N857 Hematometra: Secondary | ICD-10-CM | POA: Diagnosis not present

## 2019-02-22 DIAGNOSIS — N912 Amenorrhea, unspecified: Secondary | ICD-10-CM

## 2019-02-22 DIAGNOSIS — R1030 Lower abdominal pain, unspecified: Secondary | ICD-10-CM

## 2019-02-27 ENCOUNTER — Encounter: Payer: Self-pay | Admitting: Obstetrics & Gynecology

## 2019-02-27 NOTE — Progress Notes (Signed)
42 y.o. [id number] [name] here for pelvic ultrasound due to pelvic cramping and pain as well as not starting her menstrual cycle this month.  Pt underwent LEEP on 10/19/2018.  Although LEEP appeared to be helaing appropriatley in July, she skipped her cycle in August.  This was associated with significant pain and cramping.  Ultrasound was performed and hematometra was noted.  There was a very thin layer of tissue from scar formation at the cervix which was easily opened with a small metal dilator.  Dark, old blood drained from os and pain significantly improved.  She was advised to monitor and see if this occurred again.  It did and she called.  She is here for repeat PUS.  No LMP recorded.  Contraception: BTL  Findings:  UTERUS: 9.1 x 5.9 x 4.0cm EMS: 71mm but fluid is noted in cervix measuring about 5cm and within endometrial cavity measuring 2.4 x 0.75cm.  This appears to be blood. ADNEXA: Left ovary: 3.0 x 1.5 x 1.4cm       Right ovary: 3.0 x 2.0 x 2.6cm with simple 3.0 x 2.6cm ovarian cyst CUL DE SAC: no free fluid  Discussion:  Findings reviewed.  Cervix dilated again with small metal dilator and red blood drained.  Cervix was dilated with milex dilator and then pratt dilators to #21.  After procedure ultrasound showed fluid from endometrium was now gone and fluid in cervix was much decreased to 1.5 x 0.6cm.  Assessment:  Hematometra due to scarring at cervix from LEEP  Plan:  Will see if can obtain laminaria to see if can prevent cervix from scarring close again.  This is necessary to prevent hematocolpos again as well as to help allow the cervix to stay patent for follow-up pap smears for follow up after LEEP. Could also consider IUD placement to possibly prevent cervix from scarring closed as string will likely be through the os.  ~15 minutes spent with patient >50% of time was in face to face discussion of above.

## 2019-03-02 MED FILL — POTASSIUM CL ER 10 MEQ CAP: 10 | 15 days supply | Qty: 30 | Fill #2

## 2019-03-05 DIAGNOSIS — M25672 Stiffness of left ankle, not elsewhere classified: Secondary | ICD-10-CM | POA: Diagnosis not present

## 2019-03-05 DIAGNOSIS — R531 Weakness: Secondary | ICD-10-CM | POA: Diagnosis not present

## 2019-03-05 DIAGNOSIS — S82852D Displaced trimalleolar fracture of left lower leg, subsequent encounter for closed fracture with routine healing: Secondary | ICD-10-CM | POA: Diagnosis not present

## 2019-03-07 ENCOUNTER — Telehealth: Payer: Self-pay | Admitting: *Deleted

## 2019-03-07 NOTE — Telephone Encounter (Signed)
Call to patient to schedule office visit. Laminaria is now available for insertion.

## 2019-03-08 ENCOUNTER — Encounter: Payer: Self-pay | Admitting: *Deleted

## 2019-03-08 ENCOUNTER — Telehealth: Payer: Self-pay | Admitting: *Deleted

## 2019-03-08 NOTE — Telephone Encounter (Signed)
Patient scheduled for 6 week f/u with Dr. Tarri Glenn on 11/12 at 1:50 pm.   H. Pylori stool antigen order in Epic.   MyChart message sent to patient along with a reminder letter.

## 2019-03-12 NOTE — Telephone Encounter (Signed)
Call to patient. Laminaria insertion scheduled for 03-14-19 with next day removal on 03-15-19. Advised to take Motrin prior to appointment with food.   Routing to provider. Encounter closed,

## 2019-03-14 ENCOUNTER — Encounter: Payer: Self-pay | Admitting: Obstetrics & Gynecology

## 2019-03-14 ENCOUNTER — Other Ambulatory Visit: Payer: Self-pay

## 2019-03-14 ENCOUNTER — Ambulatory Visit (INDEPENDENT_AMBULATORY_CARE_PROVIDER_SITE_OTHER): Payer: Medicare Other | Admitting: Obstetrics & Gynecology

## 2019-03-14 VITALS — BP 106/72 | HR 70 | Temp 97.3°F | Resp 14 | Ht 64.0 in | Wt 179.4 lb

## 2019-03-14 DIAGNOSIS — N882 Stricture and stenosis of cervix uteri: Secondary | ICD-10-CM | POA: Diagnosis not present

## 2019-03-14 DIAGNOSIS — N897 Hematocolpos: Secondary | ICD-10-CM

## 2019-03-14 DIAGNOSIS — N857 Hematometra: Secondary | ICD-10-CM | POA: Diagnosis not present

## 2019-03-14 NOTE — Progress Notes (Addendum)
GYNECOLOGY  VISIT  CC:   Cervical scarring, laminaria insertion  HPI: 43 y.o. [id number] [name] here for insertion of laminaria to see if can have enough cervical dilation for cervix to not scar closed.  This seems to have occurred after her LEEP and she's had to have dilation of the cervix x 2 after her menstrual cycles due to hematocolpos and hematometra as well as with significant cramping.  Procedure reviewed with pt today.  GYNECOLOGIC HISTORY: No LMP recorded. Contraception: Tubal Ligation Menopausal hormone therapy: none  Patient Active Problem List   Diagnosis Date Noted  . Acute pyelonephritis 11/16/2018  . Lymphedema of right arm 02/27/2018  . Dyspnea 09/23/2016  . Eczema 08/31/2016  . Essential hypertension 11/05/2015  . Cervical dystonia 08/25/2015  . Headache 07/09/2015  . Myofascial muscle pain 04/29/2015  . Adhesive capsulitis of right shoulder 04/29/2015  . Type 2 diabetes mellitus without complication ([place]) 123456  . Lower extremity edema 10/01/2014  . Malignant neoplasm of overlapping sites of right breast in female, estrogen receptor positive ([place]) 05/24/2014  . Myalgia and myositis 05/17/2014  . Neoplasm related pain 03/12/2014  . CN (constipation) 11/26/2013  . H/O reduction mammoplasty 10/30/2013  . Seizure ([place]) 10/06/2013  . Status post bilateral breast implants 08/31/2013  . S/P breast reconstruction, right 08/23/2013  . AC joint arthropathy 07/02/2013  . Routine adult health maintenance 05/29/2013  . Hypokalemia 05/21/2013  . Chronic pain 04/19/2013  . Acquired absence of breast and absent nipple 01/30/2013  . RHINOSINUSITIS, CHRONIC 04/08/2010  . DEPRESSIVE DISORDER, NOS 08/04/2006    Past Medical History:  Diagnosis Date  . Anxiety   . Arthritis   . Cancer ([place]) 2011   Rt. Br. Ca  . Depression   . Diabetes mellitus without complication (HCC)    has been "borderline"  . Essential hypertension 11/05/2015  .  History of breast cancer 2011   right  . History of chemotherapy 2011  . History of radiation therapy 2011  . Hypertension    under control with med., has been on med. x 2 yr.  . Lymphedema of arm    right; no BP or puncture to right arm  . Personal history of chemotherapy 09/16/2009   rt breast  . Personal history of radiation therapy 05/2010   rt breast  . Pneumonia   . Seasonal allergies   . Sinus headache     Past Surgical History:  Procedure Laterality Date  . BREAST BIOPSY Right 08/26/2009  . BREAST REDUCTION SURGERY Left 08/23/2013   Procedure: LEFT BREAST REDUCTION  ;  Surgeon: Theodoro Kos, DO;  Location: [place];  Service: Plastics;  Laterality: Left;  . CESAREAN SECTION  07/15/2001; 03/18/2004; 11/21/2008  . lateral orbiotomy Left 11/2015   Dr. Toy Cookey at [facility]  . LATISSIMUS FLAP TO BREAST Right 03/12/2013   Procedure: RIGHT BREAST LATISSIMUS FLAP WITH EXPANDER PLACEMENT;  Surgeon: Theodoro Kos, DO;  Location: [place];  Service: Plastics;  Laterality: Right;  . LIPOSUCTION Bilateral 08/23/2013   Procedure: LIPOSUCTION;  Surgeon: Theodoro Kos, DO;  Location: [place];  Service: Plastics;  Laterality: Bilateral;  . MASTECTOMY Right 2011  . MODIFIED RADICAL MASTECTOMY Right 03/11/2010  . NASAL SEPTUM SURGERY    . ORIF ANKLE FRACTURE Left 07/18/2018   Procedure: OPEN REDUCTION INTERNAL FIXATION (ORIF) LEFT ANKLE FRACTURE WITH  SYNDESMOSIS AND ANKLE ARTHROTOMY;  Surgeon: Erle Crocker, MD;  Location: [place];  Service: Orthopedics;  Laterality: Left;  . PORT-A-CATH REMOVAL Left 12/01/2010  . PORTACATH PLACEMENT Left 09/12/2009  . REDUCTION MAMMAPLASTY Left 08/2013  . REMOVAL OF TISSUE EXPANDER AND PLACEMENT OF IMPLANT Right 08/23/2013   Procedure: REMOVAL RIGHT TISSUE EXPANDER AND PLACEMENT OF IMPLANT TO RIGHT BREAST ;  Surgeon: Theodoro Kos, DO;  Location: [place];  Service: Plastics;  Laterality: Right;  .  SUPRA-UMBILICAL HERNIA  [id number]  . TUBAL LIGATION  11/21/2008    MEDS:   Current Outpatient Medications on File Prior to Visit  Medication Sig Dispense Refill  . albuterol (PROAIR HFA) 108 (90 Base) MCG/ACT inhaler Inhale 2 puffs into the lungs every 6 (six) hours as needed for wheezing or shortness of breath. 18 g 3  . potassium chloride (MICRO-K) 10 MEQ CR capsule TAKE 1 CAPSULE (10 MEQ TOTAL) BY MOUTH 2 TIMES DAILY. 30 capsule 3  . topiramate (TOPAMAX) 50 MG tablet TAKE 3 TABLETS (150 MG TOTAL) BY MOUTH AT BEDTIME. 90 tablet 1   No current facility-administered medications on file prior to visit.     ALLERGIES: Penicillins, Lyrica [pregabalin], Meloxicam, and Robaxin [methocarbamol]  Family History  Problem Relation Age of Onset  . Hypertension Mother   . Breast cancer Mother 42  . Diabetes type II Father   . Prostate cancer Father   . Stroke Neg Hx   . Colon polyps Neg Hx   . Esophageal cancer Neg Hx   . Pancreatic cancer Neg Hx   . Stomach cancer Neg Hx   . Rectal cancer Neg Hx     SH:  Single, smoker  Review of Systems  Constitutional: Negative.   HENT: Negative.   Eyes: Negative.   Respiratory: Negative.   Cardiovascular: Negative.   Gastrointestinal: Negative.   Endocrine: Negative.   Genitourinary: Negative.   Musculoskeletal: Negative.   Skin: Negative.   Allergic/Immunologic: Negative.   Neurological: Negative.   Hematological: Negative.   Psychiatric/Behavioral: Negative.     PHYSICAL EXAMINATION:    BP 106/72 (BP Location: Left Arm, Patient Position: Sitting, Cuff Size: Large)   Pulse 70   Temp (!) 97.3 F (36.3 C) (Temporal)   Resp 14   Ht 5\' 4"  (1.626 m)   Wt 179 lb 6.4 oz (81.4 kg)   BMI 30.79 kg/m     General appearance: alert, cooperative and appears stated age Lymph:  no inguinal LAD noted  Pelvic: External genitalia:  no lesions              Urethra:  normal appearing urethra with no masses, tenderness or lesions              Bartholins  and Skenes: normal                 Vagina: normal appearing vagina with normal color and discharge, no lesions              Cervix: no lesions              Bimanual Exam:  Uterus:  normal size, contour, position, consistency, mobility, non-tender              Adnexa: no mass, fullness, tenderness  Procedure:  Using sterile technique, cervix cleansed with Betadine x 3.  Single toothed tenaculum attached to anterior lip of cervix.  Cervix dilated to #15 and then #3 laminaria inserted into cervix.  Pt did experience cramping but tolerated procedure well.  Chaperone was present for exam.  Assessment: Cervical stenosis/scarring after LEEP  resulting in hematocolpos and hematometra with two subsequent menstrual cycles  Plan: Return tomorrow for laminaria removal.

## 2019-03-15 ENCOUNTER — Ambulatory Visit (INDEPENDENT_AMBULATORY_CARE_PROVIDER_SITE_OTHER): Payer: Medicare Other | Admitting: Obstetrics & Gynecology

## 2019-03-15 ENCOUNTER — Encounter: Payer: Self-pay | Admitting: Obstetrics & Gynecology

## 2019-03-15 VITALS — BP 110/72 | HR 88 | Temp 97.8°F | Resp 16 | Ht 64.0 in | Wt 179.0 lb

## 2019-03-15 DIAGNOSIS — N882 Stricture and stenosis of cervix uteri: Secondary | ICD-10-CM

## 2019-03-16 MED FILL — POTASSIUM CL ER 10 MEQ CAP: 10 | 15 days supply | Qty: 30 | Fill #3

## 2019-03-16 NOTE — Progress Notes (Signed)
GYNECOLOGY  VISIT  CC:   Laminaria removal  HPI: 43 y.o. [id number] [name] here for laminaria removal.  Pt reports she is really cramping a low and feeling a low pelvic pressure.  Denies vaginal bleeding.Marland Kitchen  GYNECOLOGIC HISTORY: No LMP recorded. Contraception: BTL Menopausal hormone therapy: none  Patient Active Problem List   Diagnosis Date Noted  . Acute pyelonephritis 11/16/2018  . Lymphedema of right arm 02/27/2018  . Dyspnea 09/23/2016  . Eczema 08/31/2016  . Essential hypertension 11/05/2015  . Cervical dystonia 08/25/2015  . Headache 07/09/2015  . Myofascial muscle pain 04/29/2015  . Adhesive capsulitis of right shoulder 04/29/2015  . Type 2 diabetes mellitus without complication ([place]) 123456  . Lower extremity edema 10/01/2014  . Malignant neoplasm of overlapping sites of right breast in female, estrogen receptor positive ([place]) 05/24/2014  . Myalgia and myositis 05/17/2014  . Neoplasm related pain 03/12/2014  . CN (constipation) 11/26/2013  . H/O reduction mammoplasty 10/30/2013  . Seizure ([place]) 10/06/2013  . Status post bilateral breast implants 08/31/2013  . S/P breast reconstruction, right 08/23/2013  . AC joint arthropathy 07/02/2013  . Routine adult health maintenance 05/29/2013  . Hypokalemia 05/21/2013  . Chronic pain 04/19/2013  . Acquired absence of breast and absent nipple 01/30/2013  . RHINOSINUSITIS, CHRONIC 04/08/2010  . DEPRESSIVE DISORDER, NOS 08/04/2006    Past Medical History:  Diagnosis Date  . Anxiety   . Arthritis   . Cancer ([place]) 2011   Rt. Br. Ca  . Depression   . Diabetes mellitus without complication (HCC)    has been "borderline"  . Essential hypertension 11/05/2015  . History of breast cancer 2011   right  . History of chemotherapy 2011  . History of radiation therapy 2011  . Hypertension    under control with med., has been on med. x 2 yr.  . Lymphedema of arm    right; no BP or puncture to  right arm  . Personal history of chemotherapy 09/16/2009   rt breast  . Personal history of radiation therapy 05/2010   rt breast  . Pneumonia   . Seasonal allergies   . Sinus headache     Past Surgical History:  Procedure Laterality Date  . BREAST BIOPSY Right 08/26/2009  . BREAST REDUCTION SURGERY Left 08/23/2013   Procedure: LEFT BREAST REDUCTION  ;  Surgeon: Theodoro Kos, DO;  Location: [place];  Service: Plastics;  Laterality: Left;  . CESAREAN SECTION  07/15/2001; 03/18/2004; 11/21/2008  . lateral orbiotomy Left 11/2015   [facility]key [facility]  . LATISSIMUS FLAP TO BREAST Right 03/12/2013   Procedure: RIGHT BREAST LATISSIMUS FLAP WITH EXPANDER PLACEMENT;  Surgeon: Theodoro Kos, DO;  Location: [place];  Service: Plastics;  Laterality: Right;  . LIPOSUCTION Bilateral 08/23/2013   Procedure: LIPOSUCTION;  Surgeon: Theodoro Kos, DO;  Location: [place];  Service: Plastics;  Laterality: Bilateral;  . MASTECTOMY Right 2011  . MODIFIED RADICAL MASTECTOMY Right 03/11/2010  . NASAL SEPTUM SURGERY    . ORIF ANKLE FRACTURE Left 07/18/2018   Procedure: OPEN REDUCTION INTERNAL FIXATION (ORIF) LEFT ANKLE FRACTURE WITH  SYNDESMOSIS AND ANKLE ARTHROTOMY;  Surgeon: Erle Crocker, MD;  Location: [place];  Service: Orthopedics;  Laterality: Left;  . PORT-A-CATH REMOVAL Left 12/01/2010  . PORTACATH PLACEMENT Left 09/12/2009  . REDUCTION MAMMAPLASTY Left 08/2013  . REMOVAL OF TISSUE EXPANDER AND PLACEMENT OF IMPLANT Right 08/23/2013   Procedure: REMOVAL RIGHT TISSUE EXPANDER  AND PLACEMENT OF IMPLANT TO RIGHT BREAST ;  Surgeon: Theodoro Kos, DO;  Location: [place];  Service: Plastics;  Laterality: Right;  . SUPRA-UMBILICAL HERNIA  [id number]  . TUBAL LIGATION  11/21/2008    MEDS:   Current Outpatient Medications on File Prior to Visit  Medication Sig Dispense Refill  . albuterol (PROAIR HFA) 108 (90 Base) MCG/ACT inhaler Inhale 2 puffs  into the lungs every 6 (six) hours as needed for wheezing or shortness of breath. 18 g 3  . potassium chloride (MICRO-K) 10 MEQ CR capsule TAKE 1 CAPSULE (10 MEQ TOTAL) BY MOUTH 2 TIMES DAILY. 30 capsule 3  . topiramate (TOPAMAX) 50 MG tablet TAKE 3 TABLETS (150 MG TOTAL) BY MOUTH AT BEDTIME. 90 tablet 1   No current facility-administered medications on file prior to visit.     ALLERGIES: Penicillins, Lyrica [pregabalin], Meloxicam, and Robaxin [methocarbamol]  Family History  Problem Relation Age of Onset  . Hypertension Mother   . Breast cancer Mother 50  . Diabetes type II Father   . Prostate cancer Father   . Stroke Neg Hx   . Colon polyps Neg Hx   . Esophageal cancer Neg Hx   . Pancreatic cancer Neg Hx   . Stomach cancer Neg Hx   . Rectal cancer Neg Hx     SH:  Singlesmoker  Review of Systems  Genitourinary: Positive for pelvic pain (cramping).  All other systems reviewed and are negative.   PHYSICAL EXAMINATION:    BP 110/72   Pulse 88   Temp 97.8 F (36.6 C) (Temporal)   Resp 16   Ht 5\' 4"  (1.626 m)   Wt 179 lb (81.2 kg)   BMI 30.73 kg/m     General appearance: alert, cooperative and appears stated age Abdomen: soft, non-tender; bowel sounds normal; no masses,  no organomegaly Lymph:  no inguinal LAD noted  Pelvic: External genitalia:  no lesions              Urethra:  normal appearing urethra with no masses, tenderness or lesions              Bartholins and Skenes: normal                 Vagina: normal appearing vagina with normal color and discharge, no lesions              Cervix: no lesions and Laminaria present and removed easily with one pull.               Bimanual Exam:  Uterus:  normal size, contour, position, consistency, mobility, non-tender and none tender and no CMT noted              Adnexa: no mass, fullness, tenderness  There was yellowish discharge present iwht removal of laminaria but this did not have any odor.  Uterus was not tender and  there was not CMT.  Procedure yesterday was all under sterile technique.  Do not feel any treatment is necessary and pt is aware of this finding.  Knows to call if discharge continues and or if starts to have any increased pain.  Chaperone was present for exam.  Assessment: H/O cervical scarring/stenosis S/p laminaria placement and removal  Plan: Pt will call if cycle does or does not start which should be around 03/20/2019.

## 2019-03-26 ENCOUNTER — Inpatient Hospital Stay: Payer: Medicare Other | Attending: Adult Health | Admitting: Adult Health

## 2019-03-26 ENCOUNTER — Encounter: Payer: Self-pay | Admitting: Adult Health

## 2019-03-31 ENCOUNTER — Other Ambulatory Visit: Payer: Self-pay | Admitting: Oncology

## 2019-04-02 MED FILL — POTASSIUM CL ER 10 MEQ CAP: 10 | 15 days supply | Qty: 30 | Fill #0

## 2019-04-04 MED FILL — TOPIRAMATE 50 MG TABLET: 50 | 30 days supply | Qty: 90 | Fill #0

## 2019-04-05 ENCOUNTER — Ambulatory Visit: Payer: Medicare Other | Admitting: Cardiovascular Disease

## 2019-04-19 ENCOUNTER — Ambulatory Visit: Payer: Medicare Other | Admitting: Gastroenterology

## 2019-04-26 MED FILL — POTASSIUM CL ER 10 MEQ CAP: 10 | 15 days supply | Qty: 30 | Fill #2

## 2019-04-28 MED FILL — TOPIRAMATE 50 MG TABLET: 50 | 30 days supply | Qty: 90 | Fill #1

## 2019-05-09 ENCOUNTER — Ambulatory Visit: Payer: Medicare Other

## 2019-05-09 ENCOUNTER — Other Ambulatory Visit: Payer: Self-pay

## 2019-05-11 MED FILL — POTASSIUM CL ER 10 MEQ CAP: 10 | 15 days supply | Qty: 30 | Fill #3

## 2019-05-13 ENCOUNTER — Encounter: Payer: Self-pay | Admitting: Obstetrics & Gynecology

## 2019-05-14 ENCOUNTER — Other Ambulatory Visit: Payer: Self-pay | Admitting: Oncology

## 2019-05-14 ENCOUNTER — Telehealth: Payer: Self-pay | Admitting: Obstetrics & Gynecology

## 2019-05-14 DIAGNOSIS — Z30430 Encounter for insertion of intrauterine contraceptive device: Secondary | ICD-10-CM

## 2019-05-14 NOTE — Telephone Encounter (Signed)
We discussed using an IUD.  If she is ok with this, I will check with oncology as well to make she this is ok with her  hx.  Then we can just change appt to IUD placement.  Thanks.

## 2019-05-14 NOTE — Telephone Encounter (Signed)
Spoke to pt. Pt states having more cramps that is now more severe than last OV in Oct. and heavy flow for 3-4 days of the cycle. Pt has had cycles Oct, Nov and now Dec, but Dec cycle came a week early. Pt states wearing pads because tampons wont hold heavy flow. Denies lightheadedness and weakness. Pt requests to be seen and discuss options to stop severe cramping. Pt states does not want surgery.Pt scheduled OV on 05/28/19 at 10am. Pt agreeable Has 6 month f/u pap on 06/28/2019.   Will route to Dr Sabra Heck for any additional recommendations.

## 2019-05-14 NOTE — Telephone Encounter (Signed)
Placed call to pt. Pt agrees with IUD insertion. Pt aware of call to discuss benefits. Dr Sabra Heck to call oncology d/t pts hx. Pt aware to take Motrin 800 mg with food and water one hour before procedure. Pt verbalized understanding. Changed appt from cycle issues to IUD insertion.   Will route back to Dr Sabra Heck. Will close encounter.  CC: Weston Brass, IUD orders placed.

## 2019-05-14 NOTE — Progress Notes (Signed)
Dr. Sabra Heck is concerned regarding Karen Hopkins's persistent bleeding.  She is considering a Corporate treasurer.  I think that is a good option.  Possibly we can go back to tamoxifen for some time and I will discuss that with the patient in January.

## 2019-05-14 NOTE — Telephone Encounter (Signed)
Patient sent the following correspondence through [place].  Hello I wanted to make an appointment to be seen about doing something about my menstruation cycle. The cramps are really bad I have been really sick it's unbearable. I can't do it anymore. I don't want surgery. I just need it to stop coming on. I ready for option we talked about.   Thank you  [employer]

## 2019-05-15 ENCOUNTER — Telehealth: Payer: Self-pay | Admitting: Oncology

## 2019-05-15 ENCOUNTER — Ambulatory Visit (INDEPENDENT_AMBULATORY_CARE_PROVIDER_SITE_OTHER): Payer: Medicare Other | Admitting: Family Medicine

## 2019-05-15 ENCOUNTER — Encounter: Payer: Self-pay | Admitting: Family Medicine

## 2019-05-15 ENCOUNTER — Other Ambulatory Visit: Payer: Self-pay

## 2019-05-15 VITALS — BP 112/72 | HR 96 | Wt 170.6 lb

## 2019-05-15 DIAGNOSIS — Z8639 Personal history of other endocrine, nutritional and metabolic disease: Secondary | ICD-10-CM | POA: Diagnosis not present

## 2019-05-15 DIAGNOSIS — R634 Abnormal weight loss: Secondary | ICD-10-CM

## 2019-05-15 DIAGNOSIS — Z23 Encounter for immunization: Secondary | ICD-10-CM

## 2019-05-15 DIAGNOSIS — L609 Nail disorder, unspecified: Secondary | ICD-10-CM | POA: Diagnosis not present

## 2019-05-15 LAB — POCT GLYCOSYLATED HEMOGLOBIN (HGB A1C): HbA1c, POC (controlled diabetic range): 5.1 % (ref 0.0–7.0)

## 2019-05-15 MED ORDER — TETANUS-DIPHTH-ACELL PERTUSSIS 5-2.5-18.5 LF-MCG/0.5 IM SUSP: 0.5000 mL | Freq: Once | INTRAMUSCULAR | 0 refills | Status: AC

## 2019-05-15 NOTE — Patient Instructions (Signed)
It was great to see you again today!  Checking labs today Follow up in 1 month with me to monitor weight  Sending to dermatology  Flu shot today Go to pharmacy for tetanus shot  Be well, Dr. Ardelia Mems

## 2019-05-15 NOTE — Progress Notes (Signed)
Date of Visit: 05/15/2019   HPI:  Karen Hopkins presents for follow up. It has been quite a while since I saw her last (last visit was April 2018). In the interim, she has lost a lot of weight. She has no specific concerns today.  Weight loss - has lost significant weight since I last saw her. She thinks weight gain was due to medications she was taking for her breast cancer. Review of oncology notes suggest she stopped taking medication and was lost to follow up. Has upcoming appointment scheduled in January with oncologist. Denies night sweats or fevers. Feeling well overall. Review of wts shows bulk of the weight was lost since fall 2019. She was up to 242.2lb in October 2019, and has steadily lost weight since then, down now to 170.6lb. Denies doing anything specific to lose weight. In the interim has seen several specialists for evaluation. -  Has also followed up regularly with GYN. Has had endometrial biopsy - benign. 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 - Seen by GI in August and had EGD & colonoscopy on 8/7, biopsies + for H pylori (treated), also tubular adenoma. - She was admitted in June for pyelo, grew pan sensitive E coli, completed treatment  Notably previously was diagnosed with diabetes, but subsequent A1c testing since losing weight has shown A1c <6.5. not on any medications for diabetes currently.  She does smoke 3 cigarettes per day. Motivated to quit, does not want resources right now for quitting.  ROS: See HPI.  [place]: history of depression, chronic pain, breast cancer, hypertension   PHYSICAL EXAM: BP 112/72   Pulse 96   Wt 170 lb 9.6 oz (77.4 kg)   SpO2 99%   BMI 29.28 kg/m  Gen: no acute distress, pleasant, cooperative HEENT: normocephalic, atraumatic  Heart: regular rate and rhythm, no murmur Lungs: clear to auscultation bilaterally, normal work of breathing  Neuro: alert, speech normal Lymph: no lymphadenopathy appreciated in supraclavicular, cervical, axillary,  or inguinal areas Ext: No appreciable lower extremity edema bilaterally  Skin: longitudinal hyperpigmentation of one nail (new in last year per patient)  ASSESSMENT/PLAN:  Health maintenance:  -flu shot given today  -given rx for Tdap  Unintentional weight loss With known history of breast cancer. Unintentional weight loss concerning, though may simply be related to stopping her oncology medications within the last year. Has had thorough GI and GYN workup. Has upcoming appointment with oncology for follow up - would defer to them regarding the utility of repeat PET scanning/other imaging to evaluate for metastatic disease.  - Will check basic labs today - CMET, CBC, TSH, lipids, A1c.  - Exam notable for longitudinal discoloration of one nail, new per patient. In setting of unintentional weight loss will refer to derm for possible biopsy to exclude ungual malignancy. - Follow up in 1 month to re-evaluate weight.   FOLLOW UP: Follow up in 1 mo for weight loss   [place] J. Ardelia Mems, [place]

## 2019-05-15 NOTE — Telephone Encounter (Signed)
Scheduled appt per 1/27 sch message - pt aware of appt date and time   

## 2019-05-16 LAB — CMP14+EGFR
ALT: 11 IU/L (ref 0–32)
AST: 10 IU/L (ref 0–40)
Albumin/Globulin Ratio: 1.6 (ref 1.2–2.2)
Albumin: 4.7 g/dL (ref 3.8–4.8)
Alkaline Phosphatase: 106 IU/L (ref 39–117)
BUN/Creatinine Ratio: 11 (ref 9–23)
BUN: 7 mg/dL (ref 6–24)
Bilirubin Total: 0.4 mg/dL (ref 0.0–1.2)
CO2: 21 mmol/L (ref 20–29)
Calcium: 9.3 mg/dL (ref 8.7–10.2)
Chloride: 107 mmol/L — ABNORMAL HIGH (ref 96–106)
Creatinine, Ser: 0.61 mg/dL (ref 0.57–1.00)
GFR calc Af Amer: 130 mL/min/{1.73_m2} (ref >59)
GFR calc non Af Amer: 113 mL/min/{1.73_m2} (ref >59)
Globulin, Total: 3 g/dL (ref 1.5–4.5)
Glucose: 93 mg/dL (ref 65–99)
Potassium: 3.4 mmol/L — ABNORMAL LOW (ref 3.5–5.2)
Sodium: 142 mmol/L (ref 134–144)
Total Protein: 7.7 g/dL (ref 6.0–8.5)

## 2019-05-16 LAB — LIPID PANEL
Chol/HDL Ratio: 2.6 ratio (ref 0.0–4.4)
Cholesterol, Total: 79 mg/dL — ABNORMAL LOW (ref 100–199)
HDL: 30 mg/dL — ABNORMAL LOW (ref >39)
LDL Chol Calc (NIH): 38 mg/dL (ref 0–99)
Triglycerides: 36 mg/dL (ref 0–149)
VLDL Cholesterol Cal: 11 mg/dL (ref 5–40)

## 2019-05-16 LAB — CBC
Hematocrit: 42.9 % (ref 34.0–46.6)
Hemoglobin: 14.1 g/dL (ref 11.1–15.9)
MCH: 30.9 pg (ref 26.6–33.0)
MCHC: 32.9 g/dL (ref 31.5–35.7)
MCV: 94 fL (ref 79–97)
Platelets: 250 10*3/uL (ref 150–450)
RBC: 4.56 x10E6/uL (ref 3.77–5.28)
RDW: 12.8 % (ref 11.7–15.4)
WBC: 7.5 10*3/uL (ref 3.4–10.8)

## 2019-05-16 LAB — TSH: TSH: 0.856 u[IU]/mL (ref 0.450–4.500)

## 2019-05-18 ENCOUNTER — Telehealth: Payer: Self-pay | Admitting: Obstetrics & Gynecology

## 2019-05-18 NOTE — Telephone Encounter (Signed)
Call placed to convey benefits for IUD insertion. Spoke with patient and conveyed benefits. Patient understands/agreeabe with the benefits. Patient is aware of the cancellation policy.Appointment scheduled 05/28/19.

## 2019-05-21 DIAGNOSIS — R634 Abnormal weight loss: Secondary | ICD-10-CM | POA: Insufficient documentation

## 2019-05-21 NOTE — Assessment & Plan Note (Addendum)
With known history of breast cancer. Unintentional weight loss concerning, though may simply be related to stopping her oncology medications within the last year. Has had thorough GI and GYN workup. Has upcoming appointment with oncology for follow up - would defer to them regarding the utility of repeat PET scanning/other imaging to evaluate for metastatic disease.  - Will check basic labs today - CMET, CBC, TSH, lipids, A1c.  - Exam notable for longitudinal discoloration of one nail, new per patient. In setting of unintentional weight loss will refer to derm for possible biopsy to exclude ungual malignancy. - Follow up in 1 month to re-evaluate weight.

## 2019-05-23 ENCOUNTER — Telehealth: Payer: Self-pay | Admitting: *Deleted

## 2019-05-23 NOTE — Telephone Encounter (Signed)
Records faxed to [facility] to att Coralie Common - Release [id number]

## 2019-05-24 ENCOUNTER — Other Ambulatory Visit: Payer: Self-pay

## 2019-05-26 ENCOUNTER — Other Ambulatory Visit: Payer: Self-pay | Admitting: Oncology

## 2019-05-26 MED FILL — POTASSIUM CL ER 10 MEQ CAP: 10 | 15 days supply | Qty: 30 | Fill #0

## 2019-05-28 ENCOUNTER — Other Ambulatory Visit: Payer: Self-pay | Admitting: Neurology

## 2019-05-28 ENCOUNTER — Telehealth: Payer: Self-pay | Admitting: *Deleted

## 2019-05-28 ENCOUNTER — Ambulatory Visit: Payer: Medicare Other | Admitting: Obstetrics & Gynecology

## 2019-05-28 DIAGNOSIS — G43009 Migraine without aura, not intractable, without status migrainosus: Secondary | ICD-10-CM

## 2019-05-28 MED FILL — TOPIRAMATE 50 MG TABLET: 50 | 30 days supply | Qty: 90 | Fill #0

## 2019-05-28 NOTE — Telephone Encounter (Signed)
Spoke with pt. Pt states having abd upset this morning and not feeling well. Denies COVID sx and cancelled IUD insertion for today. Pt rescheduled for 06/04/19 at 3pm for Dr Sabra Heck. Pt agreeable. Pt reminded to take Motrin 800 mg with food and water one hour before procedure. Pt verbalized understanding.   Will route to Dr Sabra Heck for review and will close encounter.

## 2019-05-28 NOTE — Telephone Encounter (Signed)
Patient cancelled iud insertion appointment today because she is sick. Would like a call to reschedule.

## 2019-05-29 ENCOUNTER — Other Ambulatory Visit: Payer: Self-pay | Admitting: Neurology

## 2019-05-29 DIAGNOSIS — G43009 Migraine without aura, not intractable, without status migrainosus: Secondary | ICD-10-CM

## 2019-05-30 ENCOUNTER — Other Ambulatory Visit: Payer: Self-pay

## 2019-06-04 ENCOUNTER — Ambulatory Visit (INDEPENDENT_AMBULATORY_CARE_PROVIDER_SITE_OTHER): Payer: Medicare Other | Admitting: Obstetrics & Gynecology

## 2019-06-04 ENCOUNTER — Encounter: Payer: Self-pay | Admitting: Obstetrics & Gynecology

## 2019-06-04 ENCOUNTER — Other Ambulatory Visit: Payer: Self-pay

## 2019-06-04 VITALS — BP 110/75 | HR 65 | Temp 97.0°F | Ht 64.0 in | Wt 175.8 lb

## 2019-06-04 DIAGNOSIS — Z113 Encounter for screening for infections with a predominantly sexual mode of transmission: Secondary | ICD-10-CM

## 2019-06-04 DIAGNOSIS — N920 Excessive and frequent menstruation with regular cycle: Secondary | ICD-10-CM | POA: Diagnosis not present

## 2019-06-04 DIAGNOSIS — Z30430 Encounter for insertion of intrauterine contraceptive device: Secondary | ICD-10-CM | POA: Diagnosis not present

## 2019-06-04 NOTE — Progress Notes (Addendum)
GYNECOLOGY  VISIT   HPI: 43 y.o. [id number] [name] here for IUD insertion.  LMP 05/14/2019 and it lasted 5 days and heavy.  She does pass clots.  She is ready to use something for treatment for bleeding.  Not interested in hysterectomy.  I did communicate with Dr. Jana Hakim about Mirena use and he was ok with using this for her symptoms.  BTL for contraception.  With partner of two years.  No STD concerns.  Does consent to GC/Chl testing.  Risks and benefits reviewed including pain, irregular bleeding, uterine perforation, malpositioned IUD, and very unlikely risk of ectopic pregnancy in this pt's case due to h/o BTL.  GYNECOLOGIC HISTORY: LMP 05/14/2019 Contraception: BTL Menopausal hormone therapy: none  Patient Active Problem List   Diagnosis Date Noted  . Unintentional weight loss 05/21/2019  . Acute pyelonephritis 11/16/2018  . Lymphedema of right arm 02/27/2018  . Dyspnea 09/23/2016  . Eczema 08/31/2016  . Essential hypertension 11/05/2015  . Cervical dystonia 08/25/2015  . Headache 07/09/2015  . Myofascial muscle pain 04/29/2015  . Adhesive capsulitis of right shoulder 04/29/2015  . Lower extremity edema 10/01/2014  . Malignant neoplasm of overlapping sites of right breast in female, estrogen receptor positive ([place]) 05/24/2014  . Myalgia and myositis 05/17/2014  . Neoplasm related pain 03/12/2014  . CN (constipation) 11/26/2013  . H/O reduction mammoplasty 10/30/2013  . Seizure ([place]) 10/06/2013  . Status post bilateral breast implants 08/31/2013  . S/P breast reconstruction, right 08/23/2013  . AC joint arthropathy 07/02/2013  . Routine adult health maintenance 05/29/2013  . Hypokalemia 05/21/2013  . Chronic pain 04/19/2013  . Acquired absence of breast and absent nipple 01/30/2013  . RHINOSINUSITIS, CHRONIC 04/08/2010  . DEPRESSIVE DISORDER, NOS 08/04/2006    Past Medical History:  Diagnosis Date  . Anxiety   . Arthritis   . Cancer  ([place] [facility]) 2011   Rt. Br. Ca  . Depression   . Diabetes mellitus without complication ([facility])    has been "borderline"  . Essential hypertension 11/05/2015  . History of breast cancer 2011   right  . History of chemotherapy 2011  . History of radiation therapy 2011  . Hypertension    under control with med., has been on med. x 2 yr.  . Lymphedema of arm    right; no BP or puncture to right arm  . Personal history of chemotherapy 09/16/2009   rt breast  . Personal history of radiation therapy 05/2010   rt breast  . Pneumonia   . Seasonal allergies   . Sinus headache     Past Surgical History:  Procedure Laterality Date  . BREAST BIOPSY Right 08/26/2009  . BREAST REDUCTION SURGERY Left 08/23/2013   Procedure: LEFT BREAST REDUCTION  ;  Surgeon: Theodoro Kos, DO;  Location: [facility];  Service: Plastics;  Laterality: Left;  . CESAREAN SECTION  07/15/2001; 03/18/2004; 11/21/2008  . lateral orbiotomy Left 11/2015   Dr. Toy Cookey at [facility]  . LATISSIMUS FLAP TO BREAST Right 03/12/2013   Procedure: RIGHT BREAST LATISSIMUS FLAP WITH EXPANDER PLACEMENT;  Surgeon: Theodoro Kos, DO;  Location: [place];  Service: Plastics;  Laterality: Right;  . LIPOSUCTION Bilateral 08/23/2013   Procedure: LIPOSUCTION;  Surgeon: Theodoro Kos, DO;  Location: [place];  Service: Plastics;  Laterality: Bilateral;  . MASTECTOMY Right 2011  . MODIFIED RADICAL MASTECTOMY Right 03/11/2010  . NASAL SEPTUM SURGERY    . ORIF ANKLE FRACTURE Left 07/18/2018  Procedure: OPEN REDUCTION INTERNAL FIXATION (ORIF) LEFT ANKLE FRACTURE WITH  SYNDESMOSIS AND ANKLE ARTHROTOMY;  Surgeon: Erle Crocker, MD;  Location: [place];  Service: Orthopedics;  Laterality: Left;  . PORT-A-CATH REMOVAL Left 12/01/2010  . PORTACATH PLACEMENT Left 09/12/2009  . REDUCTION MAMMAPLASTY Left 08/2013  . REMOVAL OF TISSUE EXPANDER AND PLACEMENT OF IMPLANT Right 08/23/2013   Procedure: REMOVAL RIGHT TISSUE  EXPANDER AND PLACEMENT OF IMPLANT TO RIGHT BREAST ;  Surgeon: Theodoro Kos, DO;  Location: [place];  Service: Plastics;  Laterality: Right;  . SUPRA-UMBILICAL HERNIA  [id number]  . TUBAL LIGATION  11/21/2008    MEDS:   Current Outpatient Medications on File Prior to Visit  Medication Sig Dispense Refill  . albuterol (PROAIR HFA) 108 (90 Base) MCG/ACT inhaler Inhale 2 puffs into the lungs every 6 (six) hours as needed for wheezing or shortness of breath. 18 g 3  . potassium chloride (MICRO-K) 10 MEQ CR capsule TAKE 1 CAPSULE BY MOUTH 2 TIMES DAILY. 30 capsule 3  . topiramate (TOPAMAX) 50 MG tablet TAKE 3 TABLETS BY MOUTH AT BEDTIME 90 tablet 0   No current facility-administered medications on file prior to visit.    ALLERGIES: Penicillins, Lyrica [pregabalin], Meloxicam, and Robaxin [methocarbamol]  Family History  Problem Relation Age of Onset  . Hypertension Mother   . Breast cancer Mother 19  . Diabetes type II Father   . Prostate cancer Father   . Stroke Neg Hx   . Colon polyps Neg Hx   . Esophageal cancer Neg Hx   . Pancreatic cancer Neg Hx   . Stomach cancer Neg Hx   . Rectal cancer Neg Hx     SH:  Single, non smoker  Review of Systems  All other systems reviewed and are negative.   PHYSICAL EXAMINATION:   Vitals:   06/04/19 1456  BP: 110/75  Pulse: 65  Temp: (!) 97 F (36.1 C)   General appearance: alert, cooperative and appears stated age Lymph:  no inguinal LAD noted  Pelvic: External genitalia:  no lesions              Urethra:  normal appearing urethra with no masses, tenderness or lesions              Bartholins and Skenes: normal                 Vagina: normal appearing vagina with normal color and discharge, no lesions              Cervix: no lesions              Bimanual Exam:  Uterus:  normal size, contour, position, consistency, mobility, non-tender              Adnexa: no mass, fullness, tenderness  Procedure:  Speculum placed.   Cervix cleansed with Betadine x 3.  Single toothed tenaculum placed on anterior lip of cervix.  Cervix dilated with Milex dilator.  Uterus sounded to 9cm.  Mirena IUD and introducer passed to fundus and released without difficulty.  Introducer removed and strings cut to 2cm.  Single toothed tenaculum removed and minimal bleeding noted.  Speculum removed.  Pt tolerated procedure well.  Lot:  [id number] Exp:  06/2021 Removal date: no later than 12/128/2025.  IUD card given to pt today as well.  Chaperone, Evern Core, RN, was present for exam.  Assessment: Menorrhagia Mirena IUD placement today H/o LEEP  Plan: GC/Chl testing  obtained today Recheck ~4 weeks.  Will repeat Pap smear on the same day as well. Reasons for calling for appt sooner discussed. Pelvic rest x 24 hours discussed.

## 2019-06-05 LAB — GC/CHLAMYDIA PROBE AMP
Chlamydia trachomatis, NAA: NEGATIVE
Neisseria Gonorrhoeae by PCR: NEGATIVE

## 2019-06-07 ENCOUNTER — Telehealth: Payer: Self-pay | Admitting: Obstetrics & Gynecology

## 2019-06-07 MED FILL — POTASSIUM CL ER 10 MEQ CAP: 10 | 15 days supply | Qty: 30 | Fill #1

## 2019-06-07 NOTE — Telephone Encounter (Signed)
Patient says she is bleeding more than she should after iud insertion.

## 2019-06-07 NOTE — Telephone Encounter (Signed)
Agree with recommendations that were given.  Thanks.

## 2019-06-07 NOTE — Telephone Encounter (Signed)
Spoke to pt. Pt states having bleeding after IUD insertion on 06/04/19. Pt states wearing thin panty liner and has light pink bleeding when wiping. Pt states also having slight cramping. Pt advised to take motrin to help with cramping. Pt agreeable. Pt given instructions on what to look for with heavy bleeding and/or clots and seek emergency care over holiday weekend. Pt verbalized understanding. Pt grateful for call and advice.   Will route to Dr Sabra Heck for any additional recommendations and advice.

## 2019-06-18 ENCOUNTER — Telehealth: Payer: Self-pay

## 2019-06-18 ENCOUNTER — Encounter: Payer: Self-pay | Admitting: Family Medicine

## 2019-06-18 ENCOUNTER — Ambulatory Visit: Payer: Medicare Other | Admitting: Family Medicine

## 2019-06-18 NOTE — Telephone Encounter (Signed)
Patient was a no call/no show for their appointment today.   

## 2019-06-18 NOTE — Progress Notes (Deleted)
PATIENT: Karen Hopkins DOB: Feb 26, 1976  REASON FOR VISIT: follow up HISTORY FROM: patient  No chief complaint on file.    HISTORY OF PRESENT ILLNESS: Today 06/18/19 Karen Hopkins is a 44 y.o. female here today for follow up for migraines. We started Amovig and continued topiramate 150mg  at bedtime at last follow up in 12/2018.   HISTORY: (copied from my note on 12/11/2018)  Karen Hopkins is a 44 y.o. female here today for follow up of migraines and subjective memory loss. We decreased her topiramate to 150mg  nightly at her last visit in 06/2018 as migraines were well managed and she was complaining of memory loss. She reports that she has started to have more headaches over the past 6 months. She has not documented headaches and is uncertain how frequently these are occurring. She has a headache today. She estimates about 1 migraine a week. She reports that headaches start at night and she wakes with a headache. It is usually bifrontal. It is pounding and hurts behind her eye. She usually takes Tylenol and drinks water. She is taking topiramate 150mg  daily without adverse effects. She feels that memory loss was related to previous medications including oxycodone, xanax and ambien. She feels that she is feeling much better and memory has improved.   HISTORY: (copied from [place] note on 06/09/2018)  Interval history 06/09/2018: Patient is being seen today as she was requesting refill of topiramate but advised her that she needs to be seen in office prior to refill.She does state thatmigraineshave been managed well with topamax and unable to state to when her last migraineoccurred. She does have new concerns today of memory loss which has been worsening over the past year. She does have some underlying cognitive deficits due to prior radiation treatment but she does endorse worsening recently. She says she has a difficult time remembering dates such as scheduled appointments  and activities that her children are involved in. She states she writes them down on a calendar but at times will even forget to look at her calendar to see if she has any scheduled appointments. Her children are 41, 14 and 9 and are actively involved in sports and she does endorse feeling overwhelmed with trying to manage all of this. She does see a psychiatrist for underlying depression and anxiety. She states that she is unsure if she will be discharged from this clinic due to multiple no-shows as she is unable to remember her scheduled appointments. She also has an extensive history of chronic pain and neuropathy for which she is being treated with gabapentin, tizanidine and oxycodone. She is currently being prescribed Xanax, Cymbalta, Prozac and Ambien for depression and anxiety.   5/23/2018visitDr. Ahern:No more headaches, doing well on the Topiramate. No issues whatsoever. No side effects to the topiramate. She is doing extremely well. She has also lost weight which is good. She feels much better. She follows with cardiology. No vision changes. Continue topiramate 200 mg a day.  1/23/2018visit Dr.Ahern;Here for follow up of headaches. The headaches have significantly improved. No side from the Topiramate. She has lost weight which is good. She feels much better. She follows with cardiology. She is feeling very well. No vision changes. Will repeat MRI brain in April. Will increase Topiramate to 200mg  a day.   01/27/2016 visitDr. Jaynee Eagles: 44 y.o. female here as a referral from Dr. Ardelia Mems for intractable headaches. PMHx of right breast carcinoma, Status post modified radical mastectomy with postoperative radiation  therapy In 2014, HTN, diabetes, migraine, depression, anxiety. She has chronic migraines. MRI of the brain showed a focus of FLAIR hyperintensity, right frontal subcortical white matter possibly a nonspecific focus of ischemic demyelination, or possible post treatment effect.  Also seen was an Empty sella.She is on ASA 81mg  for stroke prevention. She went for surgery and saw neuro-ophthalmology at [facility] for enlarged lacrimal glands, biopsy was performed and biopsy was unrevealing and steroid injection swere placed into both eyes. She saw cardiology for evaluation after echocardiogram. She is being managed by cardiology for her HTN. Her migraines are improved she still have some headaches around the eyes. No vision changes, no blurry vision, no hearing changes, no side effects to the Topiramate. She has 3 boys no more children.    HPI: Initial visit 3/27/2017AA:Karen D Personis a 44 y.o.femalehere as a referral from Dr. Rayburn Go intractable headaches. PMHx of right breast carcinoma, Status post modified radical mastectomy with postoperative radiation therapy In 2014, HTN, diabetes, migraine, depression, anxiety. She has had headaches for years. Since she was a teenager. Has to go into a dark room. The headache is worse with laying down when it is very bad. She has to sit very still. She wakes up with the headaches. Not taking tylenol, advil or naproxen daily no overuse headache. Headache starts on the right around the eye, throbbing, pulsing, and spreads to the other side of the head. She has nausea, no vomiting. She has photophobia, phonophobia. She wakes up with headaches. Snores a lot, kids tell her she excessively snores. She is very tired during the day. She also has cervical dystonia and just received botox. She has a headache several times a week and can last up to 3 days each. At least 15-20 headaches a month and at least 8 are migrainous. This frequency for over a year. Can be severe 10/10 pain. She can be excessively tired during the week but she had a sleep test for OSA neg 06/23/2011.   REVIEW OF SYSTEMS: Out of a complete 14 system review of symptoms, the patient complains only of the following symptoms, and all other reviewed systems are  negative.  ALLERGIES: Allergies  Allergen Reactions  . Penicillins Swelling    FACIAL SWELLING Did it involve swelling of the face/tongue/throat, SOB, or low BP? Yes Did it involve sudden or severe rash/hives, skin peeling, or any reaction on the inside of your mouth or nose? Unknown Did you need to seek medical attention at a hospital or doctor's office? No When did it last happen?More than 10 years If all above answers are "NO", may proceed with cephalosporin use.   Recardo Evangelist [Pregabalin] Nausea Only    .  Marland Kitchen Meloxicam Nausea Only  . Robaxin [Methocarbamol] Nausea Only    HOME MEDICATIONS: Outpatient Medications Prior to Visit  Medication Sig Dispense Refill  . albuterol (PROAIR HFA) 108 (90 Base) MCG/ACT inhaler Inhale 2 puffs into the lungs every 6 (six) hours as needed for wheezing or shortness of breath. 18 g 3  . potassium chloride (MICRO-K) 10 MEQ CR capsule TAKE 1 CAPSULE BY MOUTH 2 TIMES DAILY. 30 capsule 3  . topiramate (TOPAMAX) 50 MG tablet TAKE 3 TABLETS BY MOUTH AT BEDTIME 90 tablet 0   No facility-administered medications prior to visit.    PAST MEDICAL HISTORY: Past Medical History:  Diagnosis Date  . Anxiety   . Arthritis   . Cancer ([place]) 2011   Rt. Br. Ca  . Depression   .  Diabetes mellitus without complication (HCC)    has been "borderline"  . Essential hypertension 11/05/2015  . History of breast cancer 2011   right  . History of chemotherapy 2011  . History of radiation therapy 2011  . Hypertension    under control with med., has been on med. x 2 yr.  . Lymphedema of arm    right; no BP or puncture to right arm  . Personal history of chemotherapy 09/16/2009   rt breast  . Personal history of radiation therapy 05/2010   rt breast  . Pneumonia   . Seasonal allergies   . Sinus headache     PAST SURGICAL HISTORY: Past Surgical History:  Procedure Laterality Date  . BREAST BIOPSY Right 08/26/2009  . BREAST REDUCTION SURGERY Left 08/23/2013    Procedure: LEFT BREAST REDUCTION  ;  Surgeon: Theodoro Kos, DO;  Location: [facility];  Service: Plastics;  Laterality: Left;  . CESAREAN SECTION  07/15/2001; 03/18/2004; 11/21/2008  . lateral orbiotomy Left 11/2015   Dr. Toy Cookey at [facility]  . LATISSIMUS FLAP TO BREAST Right 03/12/2013   Procedure: RIGHT BREAST LATISSIMUS FLAP WITH EXPANDER PLACEMENT;  Surgeon: Theodoro Kos, DO;  Location: [facility];  Service: Plastics;  Laterality: Right;  . LIPOSUCTION Bilateral 08/23/2013   Procedure: LIPOSUCTION;  Surgeon: Theodoro Kos, DO;  Location: [facility] [place];  Service: Plastics;  Laterality: Bilateral;  . MASTECTOMY Right 2011  . MODIFIED RADICAL MASTECTOMY Right 03/11/2010  . NASAL SEPTUM SURGERY    . ORIF ANKLE FRACTURE Left 07/18/2018   Procedure: OPEN REDUCTION INTERNAL FIXATION (ORIF) LEFT ANKLE FRACTURE WITH  SYNDESMOSIS AND ANKLE ARTHROTOMY;  Surgeon: Erle Crocker, MD;  Location: [facility];  Service: Orthopedics;  Laterality: Left;  . PORT-A-CATH REMOVAL Left 12/01/2010  . PORTACATH PLACEMENT Left 09/12/2009  . REDUCTION MAMMAPLASTY Left 08/2013  . REMOVAL OF TISSUE EXPANDER AND PLACEMENT OF IMPLANT Right 08/23/2013   Procedure: REMOVAL RIGHT TISSUE EXPANDER AND PLACEMENT OF IMPLANT TO RIGHT BREAST ;  Surgeon: Theodoro Kos, DO;  Location: [facility];  Service: Plastics;  Laterality: Right;  . SUPRA-UMBILICAL HERNIA  [id number]  . TUBAL LIGATION  11/21/2008    FAMILY HISTORY: Family History  Problem Relation Age of Onset  . Hypertension Mother   . Breast cancer Mother 77  . Diabetes type II Father   . Prostate cancer Father   . Stroke Neg Hx   . Colon polyps Neg Hx   . Esophageal cancer Neg Hx   . Pancreatic cancer Neg Hx   . Stomach cancer Neg Hx   . Rectal cancer Neg Hx     SOCIAL HISTORY: Social History   Socioeconomic History  . Marital status: Single    Spouse name: Not on file  . Number of children: 3  . Years of education:  20  . Highest education level: Not on file  Occupational History  . Occupation: Disability   Tobacco Use  . Smoking status: Current Some Day Smoker    Packs/day: 3.00    Years: 5.00    Pack years: 15.00    Types: Cigarettes    Start date: 06/07/2002    Last attempt to quit: 04/26/2010    Years since quitting: 9.1  . Smokeless tobacco: Never Used  Substance and Sexual Activity  . Alcohol use: No  . Drug use: No  . Sexual activity: Yes    Birth control/protection: Surgical  Other Topics Concern  . Not on file  Social History Narrative   Lives with kids   Caffeine use: none    Social Determinants of Health   Financial Resource Strain:   . Difficulty of Paying Living Expenses: Not on file  Food Insecurity:   . Worried About Charity fundraiser in the Last Year: Not on file  . Ran Out of Food in the Last Year: Not on file  Transportation Needs:   . Lack of Transportation (Medical): Not on file  . Lack of Transportation (Non-Medical): Not on file  Physical Activity:   . Days of Exercise per Week: Not on file  . Minutes of Exercise per Session: Not on file  Stress:   . Feeling of Stress : Not on file  Social Connections:   . Frequency of Communication with Friends and Family: Not on file  . Frequency of Social Gatherings with Friends and Family: Not on file  . Attends Religious Services: Not on file  . Active Member of Clubs or Organizations: Not on file  . Attends Archivist Meetings: Not on file  . Marital Status: Not on file  Intimate Partner Violence:   . Fear of Current or Ex-Partner: Not on file  . Emotionally Abused: Not on file  . Physically Abused: Not on file  . Sexually Abused: Not on file      PHYSICAL EXAM  There were no vitals filed for this visit. There is no height or weight on file to calculate BMI.  Generalized: Well developed, in no acute distress  Cardiology: normal rate and rhythm, no murmur noted Neurological examination   Mentation: Alert oriented to time, place, history taking. Follows all commands speech and language fluent Cranial nerve II-XII: Pupils were equal round reactive to light. Extraocular movements were full, visual field were full on confrontational test. Facial sensation and strength were normal. Uvula tongue midline. Head turning and shoulder shrug  were normal and symmetric. Motor: The motor testing reveals 5 over 5 strength of all 4 extremities. Good symmetric motor tone is noted throughout.  Sensory: Sensory testing is intact to soft touch on all 4 extremities. No evidence of extinction is noted.  Coordination: Cerebellar testing reveals good finger-nose-finger and heel-to-shin bilaterally.  Gait and station: Gait is normal. Tandem gait is normal. Romberg is negative. No drift is seen.  Reflexes: Deep tendon reflexes are symmetric and normal bilaterally.   DIAGNOSTIC DATA (LABS, IMAGING, TESTING) - I reviewed patient records, labs, notes, testing and imaging myself where available.  No flowsheet data found.   Lab Results  Component Value Date   WBC 7.5 05/15/2019   HGB 14.1 05/15/2019   HCT 42.9 05/15/2019   MCV 94 05/15/2019   PLT 250 05/15/2019      Component Value Date/Time   NA 142 05/15/2019 1201   NA 141 05/25/2017 1140   K 3.4 (L) 05/15/2019 1201   K 3.3 (L) 05/25/2017 1140   CL 107 (H) 05/15/2019 1201   CL 105 09/27/2012 1044   CO2 21 05/15/2019 1201   CO2 25 05/25/2017 1140   GLUCOSE 93 05/15/2019 1201   GLUCOSE 99 11/18/2018 0259   GLUCOSE 96 05/25/2017 1140   GLUCOSE 118 (H) 09/27/2012 1044   BUN 7 05/15/2019 1201   BUN 9.1 05/25/2017 1140   CREATININE 0.61 05/15/2019 1201   CREATININE 0.80 09/13/2017 1322   CREATININE 0.8 05/25/2017 1140   CALCIUM 9.3 05/15/2019 1201   CALCIUM 9.3 05/25/2017 1140   PROT 7.7 05/15/2019 1201   PROT  7.9 05/25/2017 1140   ALBUMIN 4.7 05/15/2019 1201   ALBUMIN 3.9 05/25/2017 1140   AST 10 05/15/2019 1201   AST 9 09/13/2017  1322   AST 9 05/25/2017 1140   ALT 11 05/15/2019 1201   ALT 9 09/13/2017 1322   ALT 11 05/25/2017 1140   ALKPHOS 106 05/15/2019 1201   ALKPHOS 113 05/25/2017 1140   BILITOT 0.4 05/15/2019 1201   BILITOT 0.3 09/13/2017 1322   BILITOT 0.23 05/25/2017 1140   GFRNONAA 113 05/15/2019 1201   GFRNONAA >60 09/13/2017 1322   GFRAA 130 05/15/2019 1201   GFRAA >60 09/13/2017 1322   Lab Results  Component Value Date   CHOL 79 (L) 05/15/2019   HDL 30 (L) 05/15/2019   LDLCALC 38 05/15/2019   TRIG 36 05/15/2019   CHOLHDL 2.6 05/15/2019   Lab Results  Component Value Date   HGBA1C 5.1 05/15/2019   Lab Results  Component Value Date   VITAMINB12 383 06/09/2018   Lab Results  Component Value Date   TSH 0.856 05/15/2019       ASSESSMENT AND PLAN 44 y.o. year old female  has a past medical history of Anxiety, Arthritis, Cancer ([place]) (2011), Depression, Diabetes mellitus without complication ([place]), Essential hypertension (11/05/2015), History of breast cancer (2011), History of chemotherapy (2011), History of radiation therapy (2011), Hypertension, Lymphedema of arm, Personal history of chemotherapy (09/16/2009), Personal history of radiation therapy (05/2010), Pneumonia, Seasonal allergies, and Sinus headache. here with ***    ICD-10-CM   1. Migraine without aura and without status migrainosus, not intractable  G43.009        No orders of the defined types were placed in this encounter.    No orders of the defined types were placed in this encounter.     I spent 15 minutes with the patient. 50% of this time was spent counseling and educating patient on plan of care and medications.    Debbora Presto, FNP-C 06/18/2019, 9:02 AM [facility] [address], [place], [place] [address] [phone]

## 2019-06-26 ENCOUNTER — Other Ambulatory Visit: Payer: Self-pay

## 2019-06-26 NOTE — Progress Notes (Signed)
[name]  Telephone:([phone] Fax:([phone]) [phone]   ID: [name]   DOB: 05/08/1976  MR#: [id number]  PYP#:[id number]  Patient Care Team: [name], MD as PCP - General (Pediatrics) Melvenia Beam, MD as Consulting Physician (Neurology) Letta Pate Luanna Salk, MD as Consulting Physician (Physical Medicine and Rehabilitation) Dillingham, Loel Lofty, DO as Attending Physician (Plastic Surgery) Skeet Latch, MD as Attending Physician (Cardiology) Sheela Mcculley, Virgie Dad, MD as Consulting Physician (Oncology) Montez Morita, MD as Referring Physician (Family Medicine) Neldon Mc, MD as Consulting Physician (General Surgery) Dillingham, Loel Lofty, DO as Attending Physician (Plastic Surgery) Erle Crocker, MD as Consulting Physician (Orthopedic Surgery) Megan Salon, MD as Consulting Physician (Gynecology)   CHIEF COMPLAINT:  Locally advanced right breast cancer (s/p right mastectomy)  CURRENT TREATMENT: Observation   INTERVAL HISTORY: Karen Hopkins returns today for follow-up of her estrogen receptor positive breast cancer. She continues under observation.  Her most recent mammography was a left screening mammogram in 10/2016 at [facility].  Since her last visit, she presented to the ED on 10/18/2018 for lower abdominal pain and right adnexal tenderness. She underwent transabdominal, transvaginal, and ovary doppler ultrasounds at that time, which revealed: no acute abnormality; normal right ovary and non-visualized left ovary; 4.5 cm uterine fibroid.  She was seen in outpatient follow up by Dr. Sabra Heck on 10/19/2018. Pap smear performed that day was abnormal and positive for HPV. She proceeded to endometrium and cervix biopsies and endocervix curettage on 11/02/2018. Pathology from the procedure ([id number]) showed high grade squamous intraepithelial lesion in the cervix only. The endometrium and endocervix samples were negative for  malignancy.  She presented to Dr. Quincy Simmonds on 11/15/2018 with severe abdominal pain with cramping, nausea, and loss of appetite. She was taken to the ED following the appointment due to being found to have a fever with chills, tachycardia, and CI symptoms. She underwent abdomen/pelvis CT at that time, as well as urinalysis, which were consistent with urinary tract infection/pyelonephritis. UA also showed pan-sensitive E. coli and was positive for nitrites and leukocytes. She was admitted to the hospital for observation and treated with ciprofloxacin. She was discharged on 11/18/2018.   She underwent LEEP procedure on 12/21/2018 with pathology from the procedure ([id number]) revealing focal low grade squamous intraepithelial lesion.  She returned on Dr. Sabra Heck on 02/01/2019 with continued severe pain and cramping despite skipping her August menstrual cycle. Follow up pelvic ultrasound performed in office that day showed no fibroids and possible hematometra. She was treated for her hematometra at that time in the office.    She returned on 02/22/2019 again with hematometra, which was again drained. They opted to proceed with overnight laminaria placement, placed on 03/14/2019 and removed the following day.  Unfortunately, she continued to have very painful and heavy menstrual cycles. She underwent Mirena IUD placement on 06/04/2019.  She also underwent colonoscopy with upper endoscopy on 01/12/2019 under Dr. Tarri Glenn due to unintentional weight loss and abdominal bloating. Pathology from the procedure [id number]) revealed: gastritis and Heliobacter pylori in the stomach; mild inflammation in the duodenum; a tubular adenoma in the colon. The recommendation was to proceed with repeat colonoscopy in 7 years.   REVIEW OF SYSTEMS: Karen Hopkins has lost quite a bit of weight and her chest accordingly feels a little bit different.  Her left breast does not hang the same way as the right.  There is an area in the medial medial  aspect of the left breast that feels different  for her.  She also does not like the way the right breast looks.  It does not have a nipple for example.  She has had no unusual headaches, visual changes, nausea, vomiting, cough, phlegm production, or pleurisy.  There has been no change in bowel or bladder habits.  She has gone off her psychotropic medications and she thinks maybe the weight loss is related to that.  Certainly those medications can cause weight gain.  She has had surgery to the left ankle following a fall and she tells me she might need further surgery and rehab to that area.  A detailed review of systems today was otherwise stable.   HISTORY OF PRESENT ILLNESS: From the original intake note:  The patient noted swelling and redness, as well as some tenderness, over her right breast for some time. She thought this might be related to pregnancy, since she delivered her third child about 42 months ago. As the problem did not improve, she brought it to her primary care physician's attention. He treated her with Bactrim, which she says she could not tolerate because of nausea and vomiting, but which in any case did not resolve the problem, so he set her up for mammography at [facility] 08/26/2009. Dr. Sadie Haber found by exam marked skin thickening and erythema of the right breast extending pretty much throughout the breast. By mammography there were scattered fibroglandular densities, and an asymmetric density in the right upper outer quadrant with suspicious microcalcifications. There were also enlarged right axillary lymph nodes. The left appeared normal. By ultrasound there was an ill defined area of hypoechoic tissue in the right upper outer quadrant that was difficult to measure. There were also abnormal right axillary lymph nodes.  Ultrasound guided biopsy was performed the same day, and showed [phone]) a poorly differentiated invasive ductal carcinoma, which was estrogen receptor  poor at 3%, progesterone receptor positive at 14% with an elevated proliferation marker at 50%, but importantly with strong amplification of HER-2 by CISH with a ratio of 5.20.  Bilateral breast MRIs were obtained 09/02/2009. This showed an area up to 10.6 cm in the right breast showing confluent mass and non-mass enhancement. There was marked skin thickening involving the entire right breast, and numerous bulky right axillary lymph nodes were identified. The left breast was unremarkable, and there was no evidence of internal mammary lymph node involvement.  Patient was treated in the neoadjuvant setting with 4 dose dense cycles of doxorubicin and cyclophosphamide, followed by 12 weekly doses of paclitaxel and trastuzumab. Joneisha underwent definitive right modified radical mastectomy in October 2011, with a residual 2.5 mm area of tumor in the breast, grade 3, and one of 21 lymph nodes involved.   Her subsequent history is as detailed above.   PAST MEDICAL HISTORY: Past Medical History:  Diagnosis Date  . Anxiety   . Arthritis   . Cancer ([place]) 2011   Rt. Br. Ca  . Depression   . Diabetes mellitus without complication (HCC)    has been "borderline"  . Essential hypertension 11/05/2015  . History of breast cancer 2011   right  . History of chemotherapy 2011  . History of radiation therapy 2011  . Hypertension    under control with med., has been on med. x 2 yr.  . Lymphedema of arm    right; no BP or puncture to right arm  . Personal history of chemotherapy 09/16/2009   rt breast  . Personal history of radiation therapy 05/2010  rt breast  . Pneumonia   . Seasonal allergies   . Sinus headache     PAST SURGICAL HISTORY: Past Surgical History:  Procedure Laterality Date  . BREAST BIOPSY Right 08/26/2009  . BREAST REDUCTION SURGERY Left 08/23/2013   Procedure: LEFT BREAST REDUCTION  ;  Surgeon: Theodoro Kos, DO;  Location: [place];  Service: Plastics;  Laterality:  Left;  . CESAREAN SECTION  07/15/2001; 03/18/2004; 11/21/2008  . lateral orbiotomy Left 11/2015   Dr. Toy Cookey at [facility]  . LATISSIMUS FLAP TO BREAST Right 03/12/2013   Procedure: RIGHT BREAST LATISSIMUS FLAP WITH EXPANDER PLACEMENT;  Surgeon: Theodoro Kos, DO;  Location: [place];  Service: Plastics;  Laterality: Right;  . LIPOSUCTION Bilateral 08/23/2013   Procedure: LIPOSUCTION;  Surgeon: Theodoro Kos, DO;  Location: [facility] [place];  Service: Plastics;  Laterality: Bilateral;  . MASTECTOMY Right August 25, 2009  . MODIFIED RADICAL MASTECTOMY Right 03/11/2010  . NASAL SEPTUM SURGERY    . ORIF ANKLE FRACTURE Left 07/18/2018   Procedure: OPEN REDUCTION INTERNAL FIXATION (ORIF) LEFT ANKLE FRACTURE WITH  SYNDESMOSIS AND ANKLE ARTHROTOMY;  Surgeon: Erle Crocker, MD;  Location: [place];  Service: Orthopedics;  Laterality: Left;  . PORT-A-CATH REMOVAL Left 12/01/2010  . PORTACATH PLACEMENT Left 09/12/2009  . REDUCTION MAMMAPLASTY Left 08/2013  . REMOVAL OF TISSUE EXPANDER AND PLACEMENT OF IMPLANT Right 08/23/2013   Procedure: REMOVAL RIGHT TISSUE EXPANDER AND PLACEMENT OF IMPLANT TO RIGHT BREAST ;  Surgeon: Theodoro Kos, DO;  Location: [place];  Service: Plastics;  Laterality: Right;  . SUPRA-UMBILICAL HERNIA  [id number]  . TUBAL LIGATION  11/21/2008    FAMILY HISTORY Family History  Problem Relation Age of Onset  . Hypertension Mother   . Breast cancer Mother 70  . Diabetes type II Father   . Prostate cancer Father   . Stroke Neg Hx   . Colon polyps Neg Hx   . Esophageal cancer Neg Hx   . Pancreatic cancer Neg Hx   . Stomach cancer Neg Hx   . Rectal cancer Neg Hx   The patient's mother was diagnosed with stage IV breast cancer in 08/25/2016 and passed away in 11-22-17.  Also of the patient's mother's mother's six sisters, two (the patient's great-aunts) had ovarian cancer.   GYNECOLOGIC HISTORY:   (Updated 11/26/2013) The patient is GX, P3. First child was premature.  Age at first delivery, 44 years old.   she status post tubal ligation. She continues to have menstrual cycles, although irregularly.   SOCIAL HISTORY: (Updated January 2021 She is disabled. Katelynn's children are [name], and [name]. They are 32, 15 and 10 as of January 2021 they are all boys, all at home. The patient attends a non-denominational church in [place], which she considers her home, but she is currently living in [place].   ADVANCED DIRECTIVES: not in place   HEALTH MAINTENANCE:  Social History   Tobacco Use  . Smoking status: Current Some Day Smoker    Packs/day: 3.00    Years: 5.00    Pack years: 15.00    Types: Cigarettes    Start date: 06/07/2002    Last attempt to quit: 04/26/2010    Years since quitting: 9.1  . Smokeless tobacco: Never Used  Substance Use Topics  . Alcohol use: No  . Drug use: No    Colonoscopy: 01/2019, adenoma (Dr. Tarri Glenn) repeat 2025-08-25  PAP: 2018-11-23, abnormal and HPV+ (Dr. Sabra Heck)  Bone density: Never  Lipid  panel: August 2014    Allergies  Allergen Reactions  . Penicillins Swelling    FACIAL SWELLING Did it involve swelling of the face/tongue/throat, SOB, or low BP? Yes Did it involve sudden or severe rash/hives, skin peeling, or any reaction on the inside of your mouth or nose? Unknown Did you need to seek medical attention at a hospital or doctor's office? No When did it last happen?More than 10 years If all above answers are "NO", may proceed with cephalosporin use.   Recardo Evangelist [Pregabalin] Nausea Only    .  Marland Kitchen Meloxicam Nausea Only  . Robaxin [Methocarbamol] Nausea Only    Current Outpatient Medications  Medication Sig Dispense Refill  . albuterol (PROAIR HFA) 108 (90 Base) MCG/ACT inhaler Inhale 2 puffs into the lungs every 6 (six) hours as needed for wheezing or shortness of breath. 18 g 3  . potassium chloride (MICRO-K) 10 MEQ CR capsule TAKE 1 CAPSULE BY MOUTH 2 TIMES DAILY. 30 capsule 3  . topiramate (TOPAMAX)  50 MG tablet TAKE 3 TABLETS BY MOUTH AT BEDTIME 90 tablet 0   No current facility-administered medications for this visit.    OBJECTIVE: Young African-American woman in no acute distress  Vitals:   06/27/19 1021  BP: 123/78  Pulse: 65  Resp: 18  Temp: 97.8 F (36.6 C)  SpO2: 100%     Body mass index is 31.21 kg/m.    ECOG FS: 2  Filed Weights   06/27/19 1021  Weight: 181 lb 12.8 oz (82.5 kg)    Sclerae unicteric, EOMs intact Wearing a mask No cervical or supraclavicular adenopathy Lungs no rales or rhonchi Heart regular rate and rhythm Abd soft, nontender, positive bowel sounds MSK no focal spinal tenderness, no upper extremity lymphedema Neuro: nonfocal, well oriented, appropriate affect Breasts: The right breast is status post mastectomy and reconstruction.  There is darkening of the skin in the superior aspect.  The breast is approximately 15 to 20% smaller than the left.  The left breast is otherwise unremarkable.  Both axillae are benign.  LAB RESULTS: Lab Results  Component Value Date   WBC 7.9 06/27/2019   NEUTROABS 4.1 06/27/2019   HGB 13.8 06/27/2019   HCT 40.9 06/27/2019   MCV 95.8 06/27/2019   PLT 216 06/27/2019      Chemistry      Component Value Date/Time   NA 138 06/27/2019 0939   NA 142 05/15/2019 1201   NA 141 05/25/2017 1140   K 4.0 06/27/2019 0939   K 3.3 (L) 05/25/2017 1140   CL 113 (H) 06/27/2019 0939   CL 105 09/27/2012 1044   CO2 20 (L) 06/27/2019 0939   CO2 25 05/25/2017 1140   BUN 7 06/27/2019 0939   BUN 7 05/15/2019 1201   BUN 9.1 05/25/2017 1140   CREATININE 0.64 06/27/2019 0939   CREATININE 0.80 09/13/2017 1322   CREATININE 0.8 05/25/2017 1140      Component Value Date/Time   CALCIUM 8.4 (L) 06/27/2019 0939   CALCIUM 9.3 05/25/2017 1140   ALKPHOS 71 06/27/2019 0939   ALKPHOS 113 05/25/2017 1140   AST 9 (L) 06/27/2019 0939   AST 9 09/13/2017 1322   AST 9 05/25/2017 1140   ALT 11 06/27/2019 0939   ALT 9 09/13/2017 1322    ALT 11 05/25/2017 1140   BILITOT 0.3 06/27/2019 0939   BILITOT 0.4 05/15/2019 1201   BILITOT 0.3 09/13/2017 1322   BILITOT 0.23 05/25/2017 1140     STUDIES: No  results found.   ASSESSMENT: 44 y.o.  BRCA1 and BRCA2 negative [place] woman, status post right breast biopsy in March 2011 for a high-grade invasive ductal carcinoma involving overlapping sites of the right breast, ER positive 3%, PR positive 14%, strongly HER2/neu positive with MIB-1 of 15%. Biopsy proven lymph node involvement at presentation. Changes consistent with inflammatory carcinoma.   1. Treated neoadjuvantly, status post 4 dose-dense cycles of doxorubicin/cyclophosphamide, followed by 12 weekly doses of paclitaxel with trastuzumab completed September 2011.   2. Trastuzumab was then continued for a total of 1 year [to June 2012]. Post-treatment echo showed a well-preserved ejection fraction.   3. Status post right modified radical mastectomy in October 2011 showing a ypT1a ypN1 invasive ductal carcinoma, grade 3.  Status post right reconstruction with implant, March 2015   4. postmastectomy radiation completed in January 2012  5. on tamoxifen beginning January of 2012 stopped somewhere in Spring/Summer 2016  (a) anastrozole started November 2016, discontinued December 2019.  6. other problems include chronic postsurgical pain, chronic right upper extremity lymphedema, and chronic depression  7. Goserelin started 09/24/2014, discontinued November 2019.  8.  Genetics testing pending   PLAN: Karen Hopkins is now just about 9-1/2 years out from definitive surgery for her breast cancer with no evidence of disease recurrence.  This is very favorable.  She would like to consider further plastic work and I am referring her back to Dr. Marla Roe to discuss that  Because he is behind on mammography and I have entered an order for her left screening mammogram.  I do not find any significant abnormality though by palpation  and inspection in the left breast today.  She has a strong family history for breast and ovarian cancer and I am setting her up for genetics counseling and testing.  Otherwise she will return to see me in 1 year.  She knows to call for any other issue that may develop before the next visit.  Total encounter time 30 minutes.  Chelsey Kimberley, Virgie Dad, MD  06/27/19 10:47 AM Medical Oncology and Hematology [facility] [place], [place] [address] Tel. [phone]    Fax. [phone]   I, Wilburn Mylar, am acting as scribe for Dr. Virgie Dad. Khyre Germond.  I, Lurline Del MD, have reviewed the above documentation for accuracy and completeness, and I agree with the above.   *Total Encounter Time as defined by the Centers for Medicare and Medicaid Services includes, in addition to the face-to-face time of a patient visit (documented in the note above) non-face-to-face time: obtaining and reviewing outside history, ordering and reviewing medications, tests or procedures, care coordination (communications with other health care professionals or caregivers) and documentation in the medical record.

## 2019-06-27 ENCOUNTER — Inpatient Hospital Stay: Payer: Medicare Other | Attending: Adult Health | Admitting: Oncology

## 2019-06-27 ENCOUNTER — Other Ambulatory Visit: Payer: Self-pay

## 2019-06-27 ENCOUNTER — Encounter: Payer: Self-pay | Admitting: Oncology

## 2019-06-27 ENCOUNTER — Inpatient Hospital Stay: Payer: Medicare Other

## 2019-06-27 VITALS — BP 123/78 | HR 65 | Temp 97.8°F | Resp 18 | Ht 64.0 in | Wt 181.8 lb

## 2019-06-27 DIAGNOSIS — Z79899 Other long term (current) drug therapy: Secondary | ICD-10-CM | POA: Diagnosis not present

## 2019-06-27 DIAGNOSIS — E119 Type 2 diabetes mellitus without complications: Secondary | ICD-10-CM | POA: Insufficient documentation

## 2019-06-27 DIAGNOSIS — R1030 Lower abdominal pain, unspecified: Secondary | ICD-10-CM | POA: Insufficient documentation

## 2019-06-27 DIAGNOSIS — F418 Other specified anxiety disorders: Secondary | ICD-10-CM | POA: Insufficient documentation

## 2019-06-27 DIAGNOSIS — Z853 Personal history of malignant neoplasm of breast: Secondary | ICD-10-CM | POA: Insufficient documentation

## 2019-06-27 DIAGNOSIS — C50811 Malignant neoplasm of overlapping sites of right female breast: Secondary | ICD-10-CM

## 2019-06-27 DIAGNOSIS — F17210 Nicotine dependence, cigarettes, uncomplicated: Secondary | ICD-10-CM | POA: Insufficient documentation

## 2019-06-27 DIAGNOSIS — R634 Abnormal weight loss: Secondary | ICD-10-CM

## 2019-06-27 DIAGNOSIS — I10 Essential (primary) hypertension: Secondary | ICD-10-CM | POA: Insufficient documentation

## 2019-06-27 DIAGNOSIS — Z9011 Acquired absence of right breast and nipple: Secondary | ICD-10-CM | POA: Diagnosis not present

## 2019-06-27 DIAGNOSIS — I890 Lymphedema, not elsewhere classified: Secondary | ICD-10-CM | POA: Insufficient documentation

## 2019-06-27 DIAGNOSIS — Z170 Estrogen receptor positive status [ER+]: Secondary | ICD-10-CM

## 2019-06-27 DIAGNOSIS — R140 Abdominal distension (gaseous): Secondary | ICD-10-CM | POA: Insufficient documentation

## 2019-06-27 DIAGNOSIS — Z803 Family history of malignant neoplasm of breast: Secondary | ICD-10-CM | POA: Insufficient documentation

## 2019-06-27 DIAGNOSIS — Z923 Personal history of irradiation: Secondary | ICD-10-CM | POA: Insufficient documentation

## 2019-06-27 DIAGNOSIS — N857 Hematometra: Secondary | ICD-10-CM | POA: Insufficient documentation

## 2019-06-27 DIAGNOSIS — R590 Localized enlarged lymph nodes: Secondary | ICD-10-CM | POA: Diagnosis not present

## 2019-06-27 LAB — COMPREHENSIVE METABOLIC PANEL
ALT: 11 U/L (ref 0–44)
AST: 9 U/L — ABNORMAL LOW (ref 15–41)
Albumin: 3.5 g/dL (ref 3.5–5.0)
Alkaline Phosphatase: 71 U/L (ref 38–126)
Anion gap: 5 (ref 5–15)
BUN: 7 mg/dL (ref 6–20)
CO2: 20 mmol/L — ABNORMAL LOW (ref 22–32)
Calcium: 8.4 mg/dL — ABNORMAL LOW (ref 8.9–10.3)
Chloride: 113 mmol/L — ABNORMAL HIGH (ref 98–111)
Creatinine, Ser: 0.64 mg/dL (ref 0.44–1.00)
GFR calc Af Amer: >60 mL/min (ref >60)
GFR calc non Af Amer: >60 mL/min (ref >60)
Glucose, Bld: 76 mg/dL (ref 70–99)
Potassium: 4 mmol/L (ref 3.5–5.1)
Sodium: 138 mmol/L (ref 135–145)
Total Bilirubin: 0.3 mg/dL (ref 0.3–1.2)
Total Protein: 6.7 g/dL (ref 6.5–8.1)

## 2019-06-27 LAB — CBC WITH DIFFERENTIAL/PLATELET
Abs Immature Granulocytes: 0.02 10*3/uL (ref 0.00–0.07)
Basophils Absolute: 0 10*3/uL (ref 0.0–0.1)
Basophils Relative: 0 %
Eosinophils Absolute: 0.4 10*3/uL (ref 0.0–0.5)
Eosinophils Relative: 5 %
HCT: 40.9 % (ref 36.0–46.0)
Hemoglobin: 13.8 g/dL (ref 12.0–15.0)
Immature Granulocytes: 0 %
Lymphocytes Relative: 38 %
Lymphs Abs: 3 10*3/uL (ref 0.7–4.0)
MCH: 32.3 pg (ref 26.0–34.0)
MCHC: 33.7 g/dL (ref 30.0–36.0)
MCV: 95.8 fL (ref 80.0–100.0)
Monocytes Absolute: 0.5 10*3/uL (ref 0.1–1.0)
Monocytes Relative: 6 %
Neutro Abs: 4.1 10*3/uL (ref 1.7–7.7)
Neutrophils Relative %: 51 %
Platelets: 216 10*3/uL (ref 150–400)
RBC: 4.27 MIL/uL (ref 3.87–5.11)
RDW: 13.7 % (ref 11.5–15.5)
WBC: 7.9 10*3/uL (ref 4.0–10.5)
nRBC: 0 % (ref 0.0–0.2)

## 2019-06-27 NOTE — Telephone Encounter (Signed)
No entry 

## 2019-06-28 ENCOUNTER — Ambulatory Visit (INDEPENDENT_AMBULATORY_CARE_PROVIDER_SITE_OTHER): Payer: Medicare Other | Admitting: Obstetrics & Gynecology

## 2019-06-28 ENCOUNTER — Other Ambulatory Visit (HOSPITAL_COMMUNITY)
Admission: RE | Admit: 2019-06-28 | Discharge: 2019-06-28 | Disposition: A | Discharge status: Home / Self Care | Payer: Medicare Other | Source: Ambulatory Visit | Attending: Obstetrics & Gynecology | Admitting: Obstetrics & Gynecology

## 2019-06-28 ENCOUNTER — Telehealth: Payer: Self-pay | Admitting: Oncology

## 2019-06-28 ENCOUNTER — Encounter: Payer: Self-pay | Admitting: Obstetrics & Gynecology

## 2019-06-28 VITALS — BP 116/70 | HR 84 | Temp 95.7°F | Resp 12 | Ht 64.0 in | Wt 180.0 lb

## 2019-06-28 DIAGNOSIS — N871 Moderate cervical dysplasia: Secondary | ICD-10-CM | POA: Insufficient documentation

## 2019-06-28 DIAGNOSIS — N898 Other specified noninflammatory disorders of vagina: Secondary | ICD-10-CM

## 2019-06-28 DIAGNOSIS — I890 Lymphedema, not elsewhere classified: Secondary | ICD-10-CM

## 2019-06-28 DIAGNOSIS — Z30431 Encounter for routine checking of intrauterine contraceptive device: Secondary | ICD-10-CM | POA: Diagnosis not present

## 2019-06-28 NOTE — Telephone Encounter (Signed)
I talk with patient regarding schedule  

## 2019-06-28 NOTE — Progress Notes (Signed)
GYNECOLOGY  VISIT  CC:   Patient states that she has been having brown discharge.  HPI: 44 y.o. G23P2102 [name] here for 6 month pap and IUD check.  Had a lot of cramping the first two weeks.  The cramping has gone away.  She is having some brownish discharge.  She has not had anything like a cycle at this point.  She has not tried to feel the strings.   Pt had CIN 2 on biopsy with done 11/03/2018.  LEEP was 12/21/2018 but pathology only showed CIN 1.  ECC was negative.     Having some increased edema in LUA.  Has a sleeve but it's cutting into the skin.  Hasn't been in the lymphedema clinic since 9/19.  Desires referral.    GYNECOLOGIC HISTORY: Patient's last menstrual period was 05/14/2019 (lmp unknown). Contraception: Mirena IUD Menopausal hormone therapy: n/a  Patient Active Problem List   Diagnosis Date Noted  . Unintentional weight loss 05/21/2019  . Acute pyelonephritis 11/16/2018  . Lymphedema of right arm 02/27/2018  . Dyspnea 09/23/2016  . Eczema 08/31/2016  . Essential hypertension 11/05/2015  . Cervical dystonia 08/25/2015  . Headache 07/09/2015  . Myofascial muscle pain 04/29/2015  . Adhesive capsulitis of right shoulder 04/29/2015  . Lower extremity edema 10/01/2014  . Malignant neoplasm of overlapping sites of right breast in female, estrogen receptor positive ([place]) 05/24/2014  . Myalgia and myositis 05/17/2014  . Neoplasm related pain 03/12/2014  . CN (constipation) 11/26/2013  . H/O reduction mammoplasty 10/30/2013  . Seizure ([place]) 10/06/2013  . Status post bilateral breast implants 08/31/2013  . S/P breast reconstruction, right 08/23/2013  . AC joint arthropathy 07/02/2013  . Routine adult health maintenance 05/29/2013  . Hypokalemia 05/21/2013  . Chronic pain 04/19/2013  . Acquired absence of breast and absent nipple 01/30/2013  . RHINOSINUSITIS, CHRONIC 04/08/2010  . DEPRESSIVE DISORDER, NOS 08/04/2006    Past Medical  History:  Diagnosis Date  . Anxiety   . Arthritis   . Cancer ([place]) 2011   Rt. Br. Ca  . Depression   . Diabetes mellitus without complication (HCC)    has been "borderline"  . Essential hypertension 11/05/2015  . History of breast cancer 2011   right  . History of chemotherapy 2011  . History of radiation therapy 2011  . Hypertension    under control with med., has been on med. x 2 yr.  . Lymphedema of arm    right; no BP or puncture to right arm  . Personal history of chemotherapy 09/16/2009   rt breast  . Personal history of radiation therapy 05/2010   rt breast  . Pneumonia   . Seasonal allergies   . Sinus headache     Past Surgical History:  Procedure Laterality Date  . BREAST BIOPSY Right 08/26/2009  . BREAST REDUCTION SURGERY Left 08/23/2013   Procedure: LEFT BREAST REDUCTION  ;  Surgeon: Theodoro Kos, DO;  Location: [place];  Service: Plastics;  Laterality: Left;  . CESAREAN SECTION  07/15/2001; 03/18/2004; 11/21/2008  . lateral orbiotomy Left 11/2015   Dr. Toy Cookey at [facility]  . LATISSIMUS FLAP TO BREAST Right 03/12/2013   Procedure: RIGHT BREAST LATISSIMUS FLAP WITH EXPANDER PLACEMENT;  Surgeon: Theodoro Kos, DO;  Location: [place];  Service: Plastics;  Laterality: Right;  . LIPOSUCTION Bilateral 08/23/2013   Procedure: LIPOSUCTION;  Surgeon: Theodoro Kos, DO;  Location: [place];  Service: Plastics;  Laterality: Bilateral;  .  MASTECTOMY Right 2011  . MODIFIED RADICAL MASTECTOMY Right 03/11/2010  . NASAL SEPTUM SURGERY    . ORIF ANKLE FRACTURE Left 07/18/2018   Procedure: OPEN REDUCTION INTERNAL FIXATION (ORIF) LEFT ANKLE FRACTURE WITH  SYNDESMOSIS AND ANKLE ARTHROTOMY;  Surgeon: Erle Crocker, MD;  Location: [place];  Service: Orthopedics;  Laterality: Left;  . PORT-A-CATH REMOVAL Left 12/01/2010  . PORTACATH PLACEMENT Left 09/12/2009  . REDUCTION MAMMAPLASTY Left 08/2013  . REMOVAL OF TISSUE EXPANDER AND PLACEMENT OF  IMPLANT Right 08/23/2013   Procedure: REMOVAL RIGHT TISSUE EXPANDER AND PLACEMENT OF IMPLANT TO RIGHT BREAST ;  Surgeon: Theodoro Kos, DO;  Location: [place];  Service: Plastics;  Laterality: Right;  . SUPRA-UMBILICAL HERNIA  [id number]  . TUBAL LIGATION  11/21/2008    MEDS:   Current Outpatient Medications on File Prior to Visit  Medication Sig Dispense Refill  . albuterol (PROAIR HFA) 108 (90 Base) MCG/ACT inhaler Inhale 2 puffs into the lungs every 6 (six) hours as needed for wheezing or shortness of breath. 18 g 3  . levonorgestrel (MIRENA) 20 MCG/24HR IUD 1 each by Intrauterine route once.    . potassium chloride (MICRO-K) 10 MEQ CR capsule TAKE 1 CAPSULE BY MOUTH 2 TIMES DAILY. 30 capsule 3  . topiramate (TOPAMAX) 50 MG tablet TAKE 3 TABLETS BY MOUTH AT BEDTIME 90 tablet 0   No current facility-administered medications on file prior to visit.    ALLERGIES: Penicillins, Lyrica [pregabalin], Meloxicam, and Robaxin [methocarbamol]  Family History  Problem Relation Age of Onset  . Hypertension Mother   . Breast cancer Mother 93  . Diabetes type II Father   . Prostate cancer Father   . Stroke Neg Hx   . Colon polyps Neg Hx   . Esophageal cancer Neg Hx   . Pancreatic cancer Neg Hx   . Stomach cancer Neg Hx   . Rectal cancer Neg Hx     SH:  Single, non smoker  Review of Systems  Genitourinary: Positive for vaginal discharge.  All other systems reviewed and are negative.   PHYSICAL EXAMINATION:    BP 116/70 (BP Location: Left Arm, Patient Position: Sitting, Cuff Size: Large)   Pulse 84   Temp (!) 95.7 F (35.4 C) (Temporal)   Resp 12   Ht 5\' 4"  (1.626 m)   Wt 180 lb (81.6 kg)   LMP 05/14/2019 (LMP Unknown)   BMI 30.90 kg/m     General appearance: alert, cooperative and appears stated age Lymph:  no inguinal LAD noted  Pelvic: External genitalia:  no lesions              Urethra:  normal appearing urethra with no masses, tenderness or lesions               Bartholins and Skenes: normal                 Vagina: normal appearing vagina with normal color, no lesions, brownish discharge noted today from os               Cervix: no lesions, 2cm IUD string noted              Bimanual Exam:  Uterus:  normal size, contour, position, consistency, mobility, non-tender              Adnexa: no mass, fullness, tenderness  Chaperone, Terence Lux, CMA, was present for exam.  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  Repeat pap/HR HPV 6 months. Referral to lymphedema clinic placed today for pt Pt reassured about discharge that is very typical for IUD.  Recommended calling if not resolved in 2 months.

## 2019-06-29 LAB — CYTOLOGY - PAP: Diagnosis: NEGATIVE

## 2019-07-02 ENCOUNTER — Other Ambulatory Visit: Payer: Self-pay

## 2019-07-02 ENCOUNTER — Ambulatory Visit: Payer: Medicare Other | Attending: Obstetrics & Gynecology

## 2019-07-02 DIAGNOSIS — I972 Postmastectomy lymphedema syndrome: Secondary | ICD-10-CM | POA: Diagnosis not present

## 2019-07-02 DIAGNOSIS — R293 Abnormal posture: Secondary | ICD-10-CM | POA: Insufficient documentation

## 2019-07-02 DIAGNOSIS — M25611 Stiffness of right shoulder, not elsewhere classified: Secondary | ICD-10-CM | POA: Insufficient documentation

## 2019-07-02 DIAGNOSIS — M25511 Pain in right shoulder: Secondary | ICD-10-CM | POA: Diagnosis not present

## 2019-07-02 DIAGNOSIS — G8929 Other chronic pain: Secondary | ICD-10-CM | POA: Diagnosis not present

## 2019-07-02 NOTE — Therapy (Signed)
[place], [place], [address] Phone: [phone]   Fax:  [phone]  Physical Therapy Evaluation  Patient Details  Name: Karen Hopkins MRN: [id number] Date of Birth: Apr 10, 1976 Referring Provider (PT): Hale Bogus MD   Encounter Date: 07/02/2019  PT End of Session - 07/02/19 1737    Visit Number  1    Number of Visits  13    Date for PT Re-Evaluation  08/06/19    PT Start Time  1308    PT Stop Time  1400    PT Time Calculation (min)  52 min    Activity Tolerance  Patient tolerated treatment well    Behavior During Therapy  [facility] for tasks assessed/performed       Past Medical History:  Diagnosis Date  . Anxiety   . Arthritis   . Cancer ([place]) 2011   Rt. Br. Ca  . Depression   . Diabetes mellitus without complication (HCC)    has been "borderline"  . Essential hypertension 11/05/2015  . History of breast cancer 2011   right  . History of chemotherapy 2011  . History of radiation therapy 2011  . Hypertension    under control with med., has been on med. x 2 yr.  . Lymphedema of arm    right; no BP or puncture to right arm  . Personal history of chemotherapy 09/16/2009   rt breast  . Personal history of radiation therapy 05/2010   rt breast  . Pneumonia   . Seasonal allergies   . Sinus headache     Past Surgical History:  Procedure Laterality Date  . BREAST BIOPSY Right 08/26/2009  . BREAST REDUCTION SURGERY Left 08/23/2013   Procedure: LEFT BREAST REDUCTION  ;  Surgeon: Theodoro Kos, DO;  Location: [place];  Service: Plastics;  Laterality: Left;  . CESAREAN SECTION  07/15/2001; 03/18/2004; 11/21/2008  . lateral orbiotomy Left 11/2015   Dr. Toy Cookey at [facility]  . LATISSIMUS FLAP TO BREAST Right 03/12/2013   Procedure: RIGHT BREAST LATISSIMUS FLAP WITH EXPANDER PLACEMENT;  Surgeon: Theodoro Kos, DO;  Location: [place];  Service: Plastics;  Laterality: Right;  . LIPOSUCTION  Bilateral 08/23/2013   Procedure: LIPOSUCTION;  Surgeon: Theodoro Kos, DO;  Location: [place];  Service: Plastics;  Laterality: Bilateral;  . MASTECTOMY Right 2011  . MODIFIED RADICAL MASTECTOMY Right 03/11/2010  . NASAL SEPTUM SURGERY    . ORIF ANKLE FRACTURE Left 07/18/2018   Procedure: OPEN REDUCTION INTERNAL FIXATION (ORIF) LEFT ANKLE FRACTURE WITH  SYNDESMOSIS AND ANKLE ARTHROTOMY;  Surgeon: Erle Crocker, MD;  Location: [place];  Service: Orthopedics;  Laterality: Left;  . PORT-A-CATH REMOVAL Left 12/01/2010  . PORTACATH PLACEMENT Left 09/12/2009  . REDUCTION MAMMAPLASTY Left 08/2013  . REMOVAL OF TISSUE EXPANDER AND PLACEMENT OF IMPLANT Right 08/23/2013   Procedure: REMOVAL RIGHT TISSUE EXPANDER AND PLACEMENT OF IMPLANT TO RIGHT BREAST ;  Surgeon: Theodoro Kos, DO;  Location: [place];  Service: Plastics;  Laterality: Right;  . SUPRA-UMBILICAL HERNIA  [id number]  . TUBAL LIGATION  11/21/2008    There were no vitals filed for this visit.   Subjective Assessment - 07/02/19 1313    Subjective  Pt reports that she has been to physical therapy for lymphedema previously. She states that she had come to physical therapy back in September and received a flat knit sleeve that she has been wearing but has noticed her upper arm  getting fuller and fuller. She went to the OBGYN and was told that she had a lot of fluid in her upper arm. Pt reports that she wears her garment daily. She states that she currently is not performing MLD at home becuase she has forgotten how to perform it. Pt reports that she has a lot of heaviness that occasionally cuases pain when doing things like reaching in her R shoulder blade that radiates into her R axilla.    Pertinent History  R masectomy with reconstruction and 22 lymph node removal    Patient Stated Goals  I want to get my swelling under control so I can wear regular shirts.    Currently in Pain?  Yes    Pain  Score  7     Pain Location  Scapula    Pain Orientation  Right;Lateral;Lower    Pain Descriptors / Indicators  Sharp    Pain Type  Chronic pain    Pain Radiating Towards  shoulder blade to axilla    Pain Onset  More than a month ago    Pain Frequency  Intermittent    Aggravating Factors   reaching    Pain Relieving Factors  rest    Effect of Pain on Daily Activities  Pt reports she has difficulty doing daily activities due to pain/heaviness.         [facility] PT Assessment - 07/02/19 0001      Assessment   Medical Diagnosis  R breast cancer     Referring Provider (PT)  Hale Bogus MD    Onset Date/Surgical Date  --   03/2010   Hand Dominance  Right    Prior Therapy  September of 2020      Restrictions   Weight Bearing Restrictions  No      Balance Screen   Has the patient fallen in the past 6 months  No    Has the patient had a decrease in activity level because of a fear of falling?   No    Is the patient reluctant to leave their home because of a fear of falling?   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    Leisure  read, Clinical research associate   Overall Cognitive Status  Within Functional Limits for tasks assessed      Posture/Postural Control   Posture/Postural Control  Postural limitations    Postural Limitations  Forward head;Rounded Shoulders      ROM / Strength   AROM / PROM / Strength  AROM;Strength      AROM   AROM Assessment Site  Shoulder    Right/Left Shoulder  Right;Left    Right Shoulder Flexion  105 Degrees    Right Shoulder ABduction  112 Degrees    Right Shoulder Internal Rotation  5 Degrees    Right Shoulder External Rotation  82 Degrees    Left Shoulder Flexion  155 Degrees    Left  Shoulder ABduction  172 Degrees    Left Shoulder Internal Rotation  40 Degrees    Left  Shoulder External Rotation  86 Degrees      Strength   Strength Assessment Site  Shoulder    Right/Left Shoulder  Right;Left    Right Shoulder Flexion  3+/5    Right Shoulder ABduction  4-/5    Right Shoulder Internal Rotation  4-/5    Right Shoulder External Rotation  3+/5    Left Shoulder Flexion  4/5    Left Shoulder ABduction  4/5    Left Shoulder Internal Rotation  5/5    Left Shoulder External Rotation  4/5        LYMPHEDEMA/ONCOLOGY QUESTIONNAIRE - 07/02/19 1337      Type   Cancer Type  right inflammatory breast cancer      Surgeries   Mastectomy Date  --   03/2010   Saline Implant Reconstruction Date  --   03/2011   Axillary Lymph Node Dissection Date  10/05/09    Number Lymph Nodes Removed  22      Treatment   Active Chemotherapy Treatment  No    Past Chemotherapy Treatment  Yes    Date  --   more than a year ago    Active Radiation Treatment  No    Past Radiation Treatment  Yes    Date  --   more than a year ago    Current Hormone Treatment  No    Past Hormone Therapy  Yes    Drug Name  Tamoxafin      What other symptoms do you have   Are you Having Heaviness or Tightness  Yes    Are you having Pain  Yes    Are you having pitting edema  No    Is it Hard or Difficult finding clothes that fit  Yes    Do you have infections  No    Is there Decreased scar mobility  Yes      Lymphedema Stage   Stage  STAGE 2 SPONTANEOUSLY IRREVERSIBLE      Lymphedema Assessments   Lymphedema Assessments  Upper extremities      Right Upper Extremity Lymphedema   15 cm Proximal to Olecranon Process  41.2 cm    10 cm Proximal to Olecranon Process  40.3 cm    Olecranon Process  25.8 cm    15 cm Proximal to Ulnar Styloid Process  25 cm    10 cm Proximal to Ulnar Styloid Process  21 cm    Just Proximal to Ulnar Styloid Process  15.4 cm    Across Hand at [employer]  19.5 cm     At [place] of 2nd Digit  5.9 cm    Other  97.7   at axilla   Other  100.7   7 cm down from axilla     Left Upper Extremity Lymphedema   15 cm Proximal to Olecranon Process  35.8 cm    Olecranon Process  26.6 cm    15 cm Proximal to Ulnar Styloid Process  26.5 cm    Just Proximal to Ulnar Styloid Process  16.3 cm    Across Hand at [employer]  19.2 cm    At [place] of 2nd Digit  5.7 cm             Outpatient Re[facility]02/[facility]  Lymphedema Life Impact Scale Total Score  63.24 %      Objective measurements completed on examination: See above findings.  PT Education - 07/02/19 1724    Education Details  Pt was educated on lymphedema anatomy and physiology. Extra time was spent educating patient on MLD vs. vasopneumatic pump on patient request with education on the different between a basic pump and a more thorough pump due to pt currently has a basic pump. Discussed the importance of clearing fluid from the trunk in order to make way for fluid in the brachium.    [place](s) Educated  Patient    Methods  Explanation    Comprehension  Verbalized understanding       PT Short Term Goals - 07/02/19 1731      PT SHORT TERM GOAL #1   Title  Pt will be independent with HEP and MLD within 2 weeks in order to demonstrate autonomy of care.    Baseline  pt currently is not performing MLD and does not have an HEP    Time  4    Period  Weeks    Status  New    Target Date  07/23/19        PT Long Term Goals - 07/02/19 1731      PT LONG TERM GOAL #1   Title  Pt will demonstrate 4/5 strength for R shoulder external rotation and flexion to demonstrate improved functional strength.    Baseline  3+/5 R shoulder external rotation and flexion    Time  4    Period  Weeks    Status  New    Target Date  08/06/19      PT LONG TERM GOAL #2   Title  Pt will demonstrate 120 degrees Flexion and abduction in the R  shoulder within 4 weeks in order to demonstrate improved functional mobility.    Baseline  R shoulder flexion 105 degrees, R shoulder abduction 112 degrees.    Time  4    Period  Weeks    Status  New    Target Date  08/06/19      PT LONG TERM GOAL #3   Title  Patient will have reduction of R brachium limb girth by 3 cm within 4 weeks to demonstrate decreased fluid.    Baseline  see measurements.    Time  4    Period  Weeks    Status  New    Target Date  08/06/19      PT LONG TERM GOAL #4   Title  Patient to be properly fitted with compression garment to wear on daily basis.    Baseline  Pt has a garment that does not fit adequately on her arm and does not have a garment for her trunk.    Time  4    Period  Weeks    Status  New    Target Date  08/06/19      PT LONG TERM GOAL #5   Title  Pt will report 50% improvement in pain and functional mobiltiy of her RUE within 4 weeks in order to demonstrate improve quality of life.    Baseline  7/10 pain    Time  4    Period  Weeks    Status  New    Target Date  08/06/19             Plan - 07/02/19 1725    Clinical Impression Statement  Pt presents to physical therapy today with reports of edema in her RUE. She has a history of R breast cancer with modified radical  masectomy and 22 lymph node removal. She has previously been to physical therapy and returns today due to her garment is much to large as well as she is starting to notice increased edema in her R lateral/posterior R upper quadrant. Pt demonstrate significant loss of ROM and strength in her R shoulder compared to her L. Pt circumferential measurements demonstrate increased edema in her R brachium compared to her L brachium. She demonstrates soft non-pitting edema near her incision for latissimus reconstruction site. Pt will benefit from skilled physical therapy services in order to address the above limitations in order to decrease the risk for immobility and infection related  to edema.    Personal Factors and Comorbidities  Comorbidity 1;Time since onset of injury/illness/exacerbation    Comorbidities  R modified radical masectomy with 22 lymph node removal    Examination-Activity Limitations  Reach Overhead    Stability/Clinical Decision Making  Stable/Uncomplicated    Clinical Decision Making  Low    Rehab Potential  Good    PT Frequency  3x / week    PT Duration  4 weeks    PT Treatment/Interventions  Therapeutic activities;Therapeutic exercise;Patient/family education;Manual techniques;Neuromuscular re-education    PT Next Visit Plan  begin MLD focus on trunk and RUE/teaching self MLD, wrapping of the RUE, ask if she has thought about vasopneumatic pump, easy shoulder  ROM activities AA/ROM scapular strenghtening/stabilizing.    Recommended Other Services  Compression sleeve, pump?    Consulted and Agree with Plan of Care  Patient       Patient will benefit from skilled therapeutic intervention in order to improve the following deficits and impairments:  Decreased strength, Decreased range of motion, Increased edema, Pain  Visit Diagnosis: Postmastectomy lymphedema - Plan: PT plan of care cert/re-cert  Chronic right shoulder pain - Plan: PT plan of care cert/re-cert  Stiffness of right shoulder, not elsewhere classified - Plan: PT plan of care cert/re-cert     Problem List Patient Active Problem List   Diagnosis Date Noted  . Unintentional weight loss 05/21/2019  . Acute pyelonephritis 11/16/2018  . Lymphedema of right arm 02/27/2018  . Dyspnea 09/23/2016  . Eczema 08/31/2016  . Essential hypertension 11/05/2015  . Cervical dystonia 08/25/2015  . Headache 07/09/2015  . Myofascial muscle pain 04/29/2015  . Adhesive capsulitis of right shoulder 04/29/2015  . Lower extremity edema 10/01/2014  . Malignant neoplasm of overlapping sites of right breast in female, estrogen receptor positive ([place]) 05/24/2014  . Myalgia and myositis 05/17/2014  .  Neoplasm related pain 03/12/2014  . CN (constipation) 11/26/2013  . H/O reduction mammoplasty 10/30/2013  . Seizure ([place]) 10/06/2013  . Status post bilateral breast implants 08/31/2013  . S/P breast reconstruction, right 08/23/2013  . AC joint arthropathy 07/02/2013  . Routine adult health maintenance 05/29/2013  . Hypokalemia 05/21/2013  . Chronic pain 04/19/2013  . Acquired absence of breast and absent nipple 01/30/2013  . RHINOSINUSITIS, CHRONIC 04/08/2010  . DEPRESSIVE DISORDER, NOS 08/04/2006    Ander Purpura, PT 07/02/2019, 5:38 PM  [place], [place], [address] Phone: [phone]   Fax:  [phone]  Name: Floyd Parilla Trautmann MRN: [id number] Date of Birth: Oct 19, 1975

## 2019-07-04 ENCOUNTER — Ambulatory Visit: Payer: Medicare Other | Admitting: Rehabilitation

## 2019-07-04 ENCOUNTER — Other Ambulatory Visit: Payer: Self-pay

## 2019-07-04 ENCOUNTER — Encounter: Payer: Self-pay | Admitting: Rehabilitation

## 2019-07-04 DIAGNOSIS — R293 Abnormal posture: Secondary | ICD-10-CM | POA: Diagnosis not present

## 2019-07-04 DIAGNOSIS — G8929 Other chronic pain: Secondary | ICD-10-CM | POA: Diagnosis not present

## 2019-07-04 DIAGNOSIS — I972 Postmastectomy lymphedema syndrome: Secondary | ICD-10-CM

## 2019-07-04 DIAGNOSIS — M25611 Stiffness of right shoulder, not elsewhere classified: Secondary | ICD-10-CM

## 2019-07-04 DIAGNOSIS — M25511 Pain in right shoulder: Secondary | ICD-10-CM | POA: Diagnosis not present

## 2019-07-04 NOTE — Therapy (Signed)
[place], [place], [address] Phone: [phone]   Fax:  [phone]  Physical Therapy Treatment  Patient Details  Name: Karen Hopkins MRN: [id number] Date of Birth: 07-30-1975 Referring Provider (PT): Hale Bogus MD   Encounter Date: 07/04/2019  PT End of Session - 07/04/19 1217    Visit Number  2    Number of Visits  13    Date for PT Re-Evaluation  08/06/19    PT Start Time  1000    PT Stop Time  1055    PT Time Calculation (min)  55 min    Activity Tolerance  Patient tolerated treatment well    Behavior During Therapy  [facility] for tasks assessed/performed       Past Medical History:  Diagnosis Date  . Anxiety   . Arthritis   . Cancer ([place]) 2011   Rt. Br. Ca  . Depression   . Diabetes mellitus without complication (HCC)    has been "borderline"  . Essential hypertension 11/05/2015  . History of breast cancer 2011   right  . History of chemotherapy 2011  . History of radiation therapy 2011  . Hypertension    under control with med., has been on med. x 2 yr.  . Lymphedema of arm    right; no BP or puncture to right arm  . Personal history of chemotherapy 09/16/2009   rt breast  . Personal history of radiation therapy 05/2010   rt breast  . Pneumonia   . Seasonal allergies   . Sinus headache     Past Surgical History:  Procedure Laterality Date  . BREAST BIOPSY Right 08/26/2009  . BREAST REDUCTION SURGERY Left 08/23/2013   Procedure: LEFT BREAST REDUCTION  ;  Surgeon: Theodoro Kos, DO;  Location: [place];  Service: Plastics;  Laterality: Left;  . CESAREAN SECTION  07/15/2001; 03/18/2004; 11/21/2008  . lateral orbiotomy Left 11/2015   Dr. Toy Cookey at [facility]  . LATISSIMUS FLAP TO BREAST Right 03/12/2013   Procedure: RIGHT BREAST LATISSIMUS FLAP WITH EXPANDER PLACEMENT;  Surgeon: Theodoro Kos, DO;  Location: [place];  Service: Plastics;  Laterality: Right;  . LIPOSUCTION  Bilateral 08/23/2013   Procedure: LIPOSUCTION;  Surgeon: Theodoro Kos, DO;  Location: [place];  Service: Plastics;  Laterality: Bilateral;  . MASTECTOMY Right 2011  . MODIFIED RADICAL MASTECTOMY Right 03/11/2010  . NASAL SEPTUM SURGERY    . ORIF ANKLE FRACTURE Left 07/18/2018   Procedure: OPEN REDUCTION INTERNAL FIXATION (ORIF) LEFT ANKLE FRACTURE WITH  SYNDESMOSIS AND ANKLE ARTHROTOMY;  Surgeon: Erle Crocker, MD;  Location: [place];  Service: Orthopedics;  Laterality: Left;  . PORT-A-CATH REMOVAL Left 12/01/2010  . PORTACATH PLACEMENT Left 09/12/2009  . REDUCTION MAMMAPLASTY Left 08/2013  . REMOVAL OF TISSUE EXPANDER AND PLACEMENT OF IMPLANT Right 08/23/2013   Procedure: REMOVAL RIGHT TISSUE EXPANDER AND PLACEMENT OF IMPLANT TO RIGHT BREAST ;  Surgeon: Theodoro Kos, DO;  Location: [place];  Service: Plastics;  Laterality: Right;  . SUPRA-UMBILICAL HERNIA  [id number]  . TUBAL LIGATION  11/21/2008    There were no vitals filed for this visit.  Subjective Assessment - 07/04/19 1003    Subjective  I am ready to be bandaged again. I don't think I want to do the pump right now. I'll let you know if it do    Pertinent History  R masectomy with reconstruction and 22 lymph node removal  Patient Stated Goals  I want to get my swelling under control so I can wear regular shirts.    Currently in Pain?  Yes    Pain Score  7     Pain Location  Scapula    Pain Orientation  Right    Pain Descriptors / Indicators  Aching    Pain Type  Chronic pain    Pain Onset  More than a month ago    Pain Frequency  Intermittent                  [facility]patient [facility]  Lymphedema Life Impact Scale Total Score  63.24 %           [facility] Adult PT Treatment/Exercise - 07/04/19 0001      Manual Therapy   Manual Therapy  Manual Lymphatic Drainage (MLD);Soft tissue mobilization;Edema  management;Compression Bandaging    Edema Management  discussed use of either pump or comitting to MLD and pt reporting that maybe the pump would be better and okayed sending information for benefits.  Discussed getting compression bras, cami, or tshirt from a special place and to make an appt.      Soft tissue mobilization  in sidelying to the Rt scapular border and rhomboids    Manual Lymphatic Drainage (MLD)  in supine education and performance of MLD for the Rt UE with handout given after performance; breathing, Rt axillary nodes, anterior interaxillary pathway, Rt inguinal nodes, Rt axillo inguinal pathway, Rt upper arm, medial to lateral arm, forearm anterior and posterior and then reversing steps.  Pt using the Rt UE for both pathways.  Then performance by PT of sidelying MLD for the lateral trunk and towards the scapula    Compression Bandaging  thin stockinette, artiflex to the hand and up the arm, 1 6cm hand bandage, 1 8cm, and 2 10cm bandages from wrist to axilla             PT Education - 07/04/19 1217    Education Details  MLD, pump, bandaging review    [place](s) Educated  Patient    Methods  Explanation;Demonstration;Tactile cues;Verbal cues;Handout    Comprehension  Verbalized understanding       PT Short Term Goals - 07/02/19 1731      PT SHORT TERM GOAL #1   Title  Pt will be independent with HEP and MLD within 2 weeks in order to demonstrate autonomy of care.    Baseline  pt currently is not performing MLD and does not have an HEP    Time  4    Period  Weeks    Status  New    Target Date  07/23/19        PT Long Term Goals - 07/02/19 1731      PT LONG TERM GOAL #1   Title  Pt will demonstrate 4/5 strength for R shoulder external rotation and flexion to demonstrate improved functional strength.    Baseline  3+/5 R shoulder external rotation and flexion    Time  4    Period  Weeks    Status  New    Target Date  08/06/19      PT LONG TERM GOAL #2   Title   Pt will demonstrate 120 degrees Flexion and abduction in the R shoulder within 4 weeks in order to demonstrate improved functional mobility.    Baseline  R shoulder flexion 105 degrees, R shoulder abduction 112  degrees.    Time  4    Period  Weeks    Status  New    Target Date  08/06/19      PT LONG TERM GOAL #3   Title  Patient will have reduction of R brachium limb girth by 3 cm within 4 weeks to demonstrate decreased fluid.    Baseline  see measurements.    Time  4    Period  Weeks    Status  New    Target Date  08/06/19      PT LONG TERM GOAL #4   Title  Patient to be properly fitted with compression garment to wear on daily basis.    Baseline  Pt has a garment that does not fit adequately on her arm and does not have a garment for her trunk.    Time  4    Period  Weeks    Status  New    Target Date  08/06/19      PT LONG TERM GOAL #5   Title  Pt will report 50% improvement in pain and functional mobiltiy of her RUE within 4 weeks in order to demonstrate improve quality of life.    Baseline  7/10 pain    Time  4    Period  Weeks    Status  New    Target Date  08/06/19            Plan - 07/04/19 1218    Clinical Impression Statement  Initial CDT visit today for pt who is known to this clinic.  Re-education on MLD with education on importance of this or pump for maintenance and pt more willing to consider the pump.  Discussed bra use as fluid tends to build up over her regular bras and pt reports all of her garments are too large now due to weigh loss.    PT Frequency  3x / week    PT Duration  4 weeks    PT Treatment/Interventions  Therapeutic activities;Therapeutic exercise;Patient/family education;Manual techniques;Neuromuscular re-education    PT Next Visit Plan  cont CDT of the Rt UE with bandage changes as necessary, easy shoulder  ROM activities AA/ROM scapular strenghtening/stabilizing.    Recommended Other Services  script sent for bra and sleeves and pump  07/04/19    Consulted and Agree with Plan of Care  Patient       Patient will benefit from skilled therapeutic intervention in order to improve the following deficits and impairments:     Visit Diagnosis: Postmastectomy lymphedema  Chronic right shoulder pain  Stiffness of right shoulder, not elsewhere classified  Abnormal posture     Problem List Patient Active Problem List   Diagnosis Date Noted  . Unintentional weight loss 05/21/2019  . Acute pyelonephritis 11/16/2018  . Lymphedema of right arm 02/27/2018  . Dyspnea 09/23/2016  . Eczema 08/31/2016  . Essential hypertension 11/05/2015  . Cervical dystonia 08/25/2015  . Headache 07/09/2015  . Myofascial muscle pain 04/29/2015  . Adhesive capsulitis of right shoulder 04/29/2015  . Lower extremity edema 10/01/2014  . Malignant neoplasm of overlapping sites of right breast in female, estrogen receptor positive ([place]) 05/24/2014  . Myalgia and myositis 05/17/2014  . Neoplasm related pain 03/12/2014  . CN (constipation) 11/26/2013  . H/O reduction mammoplasty 10/30/2013  . Seizure ([place]) 10/06/2013  . Status post bilateral breast implants 08/31/2013  . S/P breast reconstruction, right 08/23/2013  . AC joint arthropathy 07/02/2013  . Routine adult  health maintenance 05/29/2013  . Hypokalemia 05/21/2013  . Chronic pain 04/19/2013  . Acquired absence of breast and absent nipple 01/30/2013  . RHINOSINUSITIS, CHRONIC 04/08/2010  . DEPRESSIVE DISORDER, NOS 08/04/2006    Stark Bray 07/04/2019, 12:23 PM  [place], [place], [address] Phone: [phone]   Fax:  [phone]  Name: Khia Schmaltz Speece MRN: [id number] Date of Birth: 10/21/1975

## 2019-07-04 NOTE — Patient Instructions (Signed)
Deep Effective Breath   Standing, sitting, or laying down, place both hands on the belly. Take a deep breath IN, expanding the belly; then breath OUT, contracting the belly. Repeat __5__ times. Axilla to Axilla - Sweep     On uninvolved side make 5 circles in the armpit, then pump _5__ times from involved armpit across chest to uninvolved armpit, making a pathway.     Axilla to Inguinal Nodes - Sweep   On involved side, make 5 circles at groin at panty line,  then pump _5__ times from armpit along side of trunk to outer hip, making your other pathway.    Arm Posterior: Elbow to Shoulder - Sweep   Pump _5__ times from back of elbow to top of shoulder.   Then inner to outer upper arm _5_ times,   then outer arm again _5_ times.    ARM: [place] Wrist to Elbow - Sweep   Pump or stationary circles _5__ times from wrist to elbow making sure to do both sides of the forearm.   Then retrace your steps to the outer arm   and the pathways _2-3_ times each.     PLEASE KEEP YOUR BANDAGES ON AS LONG AS POSSIBLE TO GET THE BEST SWELLING REDUCTION. Should your bandages become uncomfortable or feel too tight, follow these steps: 1. Elevate your extremity higher than your heart.  2. Try to move your arm or leg joints against the firmness of the bandage to help with moving the fluid and allow the bandages to loosen a bit.  3. If the bandaging is still is too tight, it is ok to carefully remove the top layer.  There will still be more layers under it that can provide compression to your extremity. 4. Finally, if you STILL have significant pain after trying these steps, it is ok to take the bandage off.  Check your skin carefully for any signs of irritation  5. PLEASE bring ALL bandage materials back to your next appointment as we will reuse what we can TAKE CARE OF YOUR BANDAGES SO THEY WILL LAST LONGER AND STAY IN BETTER CONDITION Washing bandages:  Wash periodically using a mild  detergent in warm water.  Do not use fabric softener or bleach.  Place bandages in a mesh lingerie bag or in a tied off pillow case and use the gentle cycle of the washing machine or hand wash. If you hand wash, you may want to put them in the spin cycle of your washer to get the extra water out, but make sure you put them in a mesh bag first. Do not wring or stretch them while they are wet.  Drying bandages: Lay the bandages out smoothly on a towel away from direct sunlight or heating sources that can damage the fabric. Rolling bandages in a towel and gently squeezing the towel to remove excess water before laying them out can speed up the process.  If you use a drying rack, place a towel on top of the rack to lay the bandages on.  If they hang down to dry, they fabric could be stretched out and the bandage will lose its compression.   Or, keep bandages in the mesh bag and dry them in the dryer on the low or no heat cycle. Rolling bandages: Please roll your bandages after drying them so they are ready for your next treatment. If they are rolled too loose, they will be difficult to apply.  If rolled too tight, they  can get stretched out.   TAKE CARE OF YOUR SKIN 1. Apply a low pH moisturizing lotion to your skin daily 2. Avoid scratching your skin 3. Treat skin irritations quickly  4. Know the 5 warning signs of infection: redness, pain, warmth to touch, fever and increased swelling.  Call your physician immediately if you notice any of these signs of a possible infection.

## 2019-07-06 ENCOUNTER — Ambulatory Visit: Payer: Medicare Other

## 2019-07-06 ENCOUNTER — Other Ambulatory Visit: Payer: Self-pay

## 2019-07-06 DIAGNOSIS — M25511 Pain in right shoulder: Secondary | ICD-10-CM | POA: Diagnosis not present

## 2019-07-06 DIAGNOSIS — G8929 Other chronic pain: Secondary | ICD-10-CM | POA: Diagnosis not present

## 2019-07-06 DIAGNOSIS — R293 Abnormal posture: Secondary | ICD-10-CM

## 2019-07-06 DIAGNOSIS — I972 Postmastectomy lymphedema syndrome: Secondary | ICD-10-CM | POA: Diagnosis not present

## 2019-07-06 DIAGNOSIS — M25611 Stiffness of right shoulder, not elsewhere classified: Secondary | ICD-10-CM

## 2019-07-06 NOTE — Therapy (Signed)
[place], [place], [address] Phone: [phone]   Fax:  [phone]  Physical Therapy Treatment  Patient Details  Name: Karen Hopkins MRN: [id number] Date of Birth: 1975/06/27 Referring Provider (PT): Hale Bogus MD   Encounter Date: 07/06/2019  PT End of Session - 07/06/19 0913    Visit Number  3    Number of Visits  13    Date for PT Re-Evaluation  08/06/19    PT Start Time  0907    PT Stop Time  1000    PT Time Calculation (min)  53 min    Activity Tolerance  Patient tolerated treatment well    Behavior During Therapy  [facility] for tasks assessed/performed       Past Medical History:  Diagnosis Date  . Anxiety   . Arthritis   . Cancer ([place]) 2011   Rt. Br. Ca  . Depression   . Diabetes mellitus without complication (HCC)    has been "borderline"  . Essential hypertension 11/05/2015  . History of breast cancer 2011   right  . History of chemotherapy 2011  . History of radiation therapy 2011  . Hypertension    under control with med., has been on med. x 2 yr.  . Lymphedema of arm    right; no BP or puncture to right arm  . Personal history of chemotherapy 09/16/2009   rt breast  . Personal history of radiation therapy 05/2010   rt breast  . Pneumonia   . Seasonal allergies   . Sinus headache     Past Surgical History:  Procedure Laterality Date  . BREAST BIOPSY Right 08/26/2009  . BREAST REDUCTION SURGERY Left 08/23/2013   Procedure: LEFT BREAST REDUCTION  ;  Surgeon: Theodoro Kos, DO;  Location: [place];  Service: Plastics;  Laterality: Left;  . CESAREAN SECTION  07/15/2001; 03/18/2004; 11/21/2008  . lateral orbiotomy Left 11/2015   Dr. Toy Cookey at [facility]  . LATISSIMUS FLAP TO BREAST Right 03/12/2013   Procedure: RIGHT BREAST LATISSIMUS FLAP WITH EXPANDER PLACEMENT;  Surgeon: Theodoro Kos, DO;  Location: [place];  Service: Plastics;  Laterality: Right;  . LIPOSUCTION  Bilateral 08/23/2013   Procedure: LIPOSUCTION;  Surgeon: Theodoro Kos, DO;  Location: [place];  Service: Plastics;  Laterality: Bilateral;  . MASTECTOMY Right 2011  . MODIFIED RADICAL MASTECTOMY Right 03/11/2010  . NASAL SEPTUM SURGERY    . ORIF ANKLE FRACTURE Left 07/18/2018   Procedure: OPEN REDUCTION INTERNAL FIXATION (ORIF) LEFT ANKLE FRACTURE WITH  SYNDESMOSIS AND ANKLE ARTHROTOMY;  Surgeon: Erle Crocker, MD;  Location: [place];  Service: Orthopedics;  Laterality: Left;  . PORT-A-CATH REMOVAL Left 12/01/2010  . PORTACATH PLACEMENT Left 09/12/2009  . REDUCTION MAMMAPLASTY Left 08/2013  . REMOVAL OF TISSUE EXPANDER AND PLACEMENT OF IMPLANT Right 08/23/2013   Procedure: REMOVAL RIGHT TISSUE EXPANDER AND PLACEMENT OF IMPLANT TO RIGHT BREAST ;  Surgeon: Theodoro Kos, DO;  Location: [place];  Service: Plastics;  Laterality: Right;  . SUPRA-UMBILICAL HERNIA  [id number]  . TUBAL LIGATION  11/21/2008    There were no vitals filed for this visit.  Subjective Assessment - 07/06/19 0913    Subjective  Pt reports that her wrap did pretty well.  She just removed it today. She states that she continues with heaviness in her arm and pain in her R shoulder blade.    Pertinent History  R masectomy with reconstruction and  22 lymph node removal    Patient Stated Goals  I want to get my swelling under control so I can wear regular shirts.    Currently in Pain?  Yes    Pain Location  Scapula    Pain Orientation  Right    Pain Descriptors / Indicators  Aching    Pain Type  Chronic pain    Pain Radiating Towards  Shoulder blade to axilla    Pain Onset  More than a month ago    Pain Frequency  Intermittent    Aggravating Factors   reaching    Pain Relieving Factors  rest    Effect of Pain on Daily Activities  pt continues with difficulty doing daiy activitiesdue to pain/heaviness.                  Outpatient Rehab from 07/02/2019 in [facility]  Lymphedema Life Impact Scale Total Score  63.24 %           [facility] Adult PT Treatment/Exercise - 07/06/19 0001      Manual Therapy   Manual Therapy  Manual Lymphatic Drainage (MLD);Soft tissue mobilization;Edema management;Compression Bandaging    Soft tissue mobilization  in sidelying to the Rt scapular border and rhomboids    Manual Lymphatic Drainage (MLD)  in supine: short neck, swimming in the terminus, Bil shoulders, Bil axillary and R inguinal nodes, anterior inter-axillary anastomosis, R axillo-inguinal anastomosis, L side-lying, posterior inter-axillary anastomosis, supine: R axillo-inguinal anastomosis, R shoulder, lateral R brachium, medial to lateral R brachium, lateral R brachium, all surfaces of antebrachium and dorsum of the hand. Re-worked all surfaces followed by deep abdominals.     Compression Bandaging  thin stockinette, 1.5 artiflex and 1 cellona for shaping with ABD pad at the R antecubital fossa, artiflex to the hand and up the arm, 1 6cm hand bandage, 1 8cm, and 2 10cm bandages from wrist to axilla               PT Short Term Goals - 07/02/19 1731      PT SHORT TERM GOAL #1   Title  Pt will be independent with HEP and MLD within 2 weeks in order to demonstrate autonomy of care.    Baseline  pt currently is not performing MLD and does not have an HEP    Time  4    Period  Weeks    Status  New    Target Date  07/23/19        PT Long Term Goals - 07/02/19 1731      PT LONG TERM GOAL #1   Title  Pt will demonstrate 4/5 strength for R shoulder external rotation and flexion to demonstrate improved functional strength.    Baseline  3+/5 R shoulder external rotation and flexion    Time  4    Period  Weeks    Status  New    Target Date  08/06/19      PT LONG TERM GOAL #2   Title  Pt will demonstrate 120 degrees Flexion and abduction in the R shoulder within 4 weeks in order to demonstrate improved functional  mobility.    Baseline  R shoulder flexion 105 degrees, R shoulder abduction 112 degrees.    Time  4    Period  Weeks    Status  New    Target Date  08/06/19      PT LONG TERM GOAL #3   Title  Patient will have reduction of R brachium limb girth by 3 cm within 4 weeks to demonstrate decreased fluid.    Baseline  see measurements.    Time  4    Period  Weeks    Status  New    Target Date  08/06/19      PT LONG TERM GOAL #4   Title  Patient to be properly fitted with compression garment to wear on daily basis.    Baseline  Pt has a garment that does not fit adequately on her arm and does not have a garment for her trunk.    Time  4    Period  Weeks    Status  New    Target Date  08/06/19      PT LONG TERM GOAL #5   Title  Pt will report 50% improvement in pain and functional mobiltiy of her RUE within 4 weeks in order to demonstrate improve quality of life.    Baseline  7/10 pain    Time  4    Period  Weeks    Status  New    Target Date  08/06/19            Plan - 07/06/19 0911    Clinical Impression Statement  Pt presents with improvement in edema in her R brachium as demonstrated by creping and softening of the skin. MLD was performed of the anterior trunk with extra time spent on the posterior inter-axillary anastomosis. Light STM was performed to the R Rhomboids and lower trapezius; decreased following light STM. Pt was wrapped with layered compression wrap on the RUE. Pt will benefit from continued POC at this time.    Personal Factors and Comorbidities  Comorbidity 1;Time since onset of injury/illness/exacerbation    Comorbidities  R modified radical masectomy with 22 lymph node removal    Examination-Activity Limitations  Reach Overhead    Stability/Clinical Decision Making  Stable/Uncomplicated    Rehab Potential  Good    PT Frequency  3x / week    PT Duration  4 weeks    PT Treatment/Interventions  Therapeutic activities;Therapeutic exercise;Patient/family  education;Manual techniques;Neuromuscular re-education    PT Next Visit Plan  cont CDT of the Rt UE with bandage changes as necessary, easy shoulder  ROM activities AA/ROM scapular strengthening/stabilizing.    Consulted and Agree with Plan of Care  Patient       Patient will benefit from skilled therapeutic intervention in order to improve the following deficits and impairments:  Decreased strength, Decreased range of motion, Increased edema, Pain  Visit Diagnosis: Postmastectomy lymphedema  Chronic right shoulder pain  Stiffness of right shoulder, not elsewhere classified  Abnormal posture     Problem List Patient Active Problem List   Diagnosis Date Noted  . Unintentional weight loss 05/21/2019  . Acute pyelonephritis 11/16/2018  . Lymphedema of right arm 02/27/2018  . Dyspnea 09/23/2016  . Eczema 08/31/2016  . Essential hypertension 11/05/2015  . Cervical dystonia 08/25/2015  . Headache 07/09/2015  . Myofascial muscle pain 04/29/2015  . Adhesive capsulitis of right shoulder 04/29/2015  . Lower extremity edema 10/01/2014  . Malignant neoplasm of overlapping sites of right breast in female, estrogen receptor positive ([place]) 05/24/2014  . Myalgia and myositis 05/17/2014  . Neoplasm related pain 03/12/2014  . CN (constipation) 11/26/2013  . H/O reduction mammoplasty 10/30/2013  . Seizure ([place]) 10/06/2013  . Status post bilateral breast implants 08/31/2013  . S/P breast reconstruction, right 08/23/2013  .  AC joint arthropathy 07/02/2013  . Routine adult health maintenance 05/29/2013  . Hypokalemia 05/21/2013  . Chronic pain 04/19/2013  . Acquired absence of breast and absent nipple 01/30/2013  . RHINOSINUSITIS, CHRONIC 04/08/2010  . DEPRESSIVE DISORDER, NOS 08/04/2006    Ander Purpura, PT 07/06/2019, 12:39 PM  [place], [place], [address] Phone: [phone]   Fax:   [phone]  Name: Karen Hopkins MRN: [id number] Date of Birth: [birth year]

## 2019-07-09 ENCOUNTER — Ambulatory Visit: Payer: Medicare Other | Attending: Obstetrics & Gynecology

## 2019-07-09 ENCOUNTER — Other Ambulatory Visit: Payer: Self-pay

## 2019-07-09 DIAGNOSIS — M25611 Stiffness of right shoulder, not elsewhere classified: Secondary | ICD-10-CM | POA: Diagnosis not present

## 2019-07-09 DIAGNOSIS — I972 Postmastectomy lymphedema syndrome: Secondary | ICD-10-CM | POA: Insufficient documentation

## 2019-07-09 DIAGNOSIS — R293 Abnormal posture: Secondary | ICD-10-CM | POA: Insufficient documentation

## 2019-07-09 DIAGNOSIS — M25511 Pain in right shoulder: Secondary | ICD-10-CM | POA: Insufficient documentation

## 2019-07-09 DIAGNOSIS — G8929 Other chronic pain: Secondary | ICD-10-CM | POA: Diagnosis not present

## 2019-07-09 NOTE — Therapy (Signed)
[place], [place], [address] Phone: [phone]   Fax:  [phone]  Physical Therapy Treatment  Patient Details  Name: Karen Hopkins MRN: [id number] Date of Birth: [birth year] Referring Provider (PT): Hale Bogus MD   Encounter Date: 07/09/2019  PT End of Session - 07/09/19 1001    Visit Number  4    Number of Visits  13    Date for PT Re-Evaluation  08/06/19    PT Start Time  0905    PT Stop Time  0958    PT Time Calculation (min)  53 min    Activity Tolerance  Patient tolerated treatment well    Behavior During Therapy  [facility] for tasks assessed/performed       Past Medical History:  Diagnosis Date  . Anxiety   . Arthritis   . Cancer ([place]) 2011   Rt. Br. Ca  . Depression   . Diabetes mellitus without complication (HCC)    has been "borderline"  . Essential hypertension 11/05/2015  . History of breast cancer 2011   right  . History of chemotherapy 2011  . History of radiation therapy 2011  . Hypertension    under control with med., has been on med. x 2 yr.  . Lymphedema of arm    right; no BP or puncture to right arm  . Personal history of chemotherapy 09/16/2009   rt breast  . Personal history of radiation therapy 05/2010   rt breast  . Pneumonia   . Seasonal allergies   . Sinus headache     Past Surgical History:  Procedure Laterality Date  . BREAST BIOPSY Right 08/26/2009  . BREAST REDUCTION SURGERY Left 08/23/2013   Procedure: LEFT BREAST REDUCTION  ;  Surgeon: Theodoro Kos, DO;  Location: [place];  Service: Plastics;  Laterality: Left;  . CESAREAN SECTION  07/15/2001; 03/18/2004; 11/21/2008  . lateral orbiotomy Left 11/2015   Dr. Toy Cookey at [facility]  . LATISSIMUS FLAP TO BREAST Right 03/12/2013   Procedure: RIGHT BREAST LATISSIMUS FLAP WITH EXPANDER PLACEMENT;  Surgeon: Theodoro Kos, DO;  Location: [place];  Service: Plastics;  Laterality: Right;  . LIPOSUCTION  Bilateral 08/23/2013   Procedure: LIPOSUCTION;  Surgeon: Theodoro Kos, DO;  Location: [place];  Service: Plastics;  Laterality: Bilateral;  . MASTECTOMY Right 2011  . MODIFIED RADICAL MASTECTOMY Right 03/11/2010  . NASAL SEPTUM SURGERY    . ORIF ANKLE FRACTURE Left 07/18/2018   Procedure: OPEN REDUCTION INTERNAL FIXATION (ORIF) LEFT ANKLE FRACTURE WITH  SYNDESMOSIS AND ANKLE ARTHROTOMY;  Surgeon: Erle Crocker, MD;  Location: [place];  Service: Orthopedics;  Laterality: Left;  . PORT-A-CATH REMOVAL Left 12/01/2010  . PORTACATH PLACEMENT Left 09/12/2009  . REDUCTION MAMMAPLASTY Left 08/2013  . REMOVAL OF TISSUE EXPANDER AND PLACEMENT OF IMPLANT Right 08/23/2013   Procedure: REMOVAL RIGHT TISSUE EXPANDER AND PLACEMENT OF IMPLANT TO RIGHT BREAST ;  Surgeon: Theodoro Kos, DO;  Location: [place];  Service: Plastics;  Laterality: Right;  . SUPRA-UMBILICAL HERNIA  [id number]  . TUBAL LIGATION  11/21/2008    There were no vitals filed for this visit.  Subjective Assessment - 07/09/19 0914    Subjective  I left my bandages on until last night. I didn't like the fingers being wrapped, wan to try leaving them unwrapped again. My arm did look smaller when I took them off last night.    Pertinent History  R masectomy  with reconstruction and 22 lymph node removal    Patient Stated Goals  I want to get my swelling under control so I can wear regular shirts.    Currently in Pain?  No/denies                  Outpatient Rehab from 07/02/2019 in [facility]  Lymphedema Life Impact Scale Total Score  63.24 %           [facility] Adult PT Treatment/Exercise - 07/09/19 0001      Manual Therapy   Manual Lymphatic Drainage (MLD)  in supine: short neck, superficial and deep abdominals, Lt axillary and Rt inguinal nodes, anterior inter-axillary anastomosis, R axillo-inguinal anastomosis,then Rt UE as follows: lateral R  brachium, medial to lateral R brachium, lateral R brachium, all surfaces of antebrachium and dorsum of the hand. Re-worked all surfaces redirecting to anastomosis    Compression Bandaging  Massage cream, thin stockinette, artiflex x1 (with 1/2" gray foam to upper lateral arm) to the hand and up the arm, 1 6cm hand bandage, 1 8cm, and 2 10cm bandages from wrist to axilla.               PT Short Term Goals - 07/02/19 1731      PT SHORT TERM GOAL #1   Title  Pt will be independent with HEP and MLD within 2 weeks in order to demonstrate autonomy of care.    Baseline  pt currently is not performing MLD and does not have an HEP    Time  4    Period  Weeks    Status  New    Target Date  07/23/19        PT Long Term Goals - 07/02/19 1731      PT LONG TERM GOAL #1   Title  Pt will demonstrate 4/5 strength for R shoulder external rotation and flexion to demonstrate improved functional strength.    Baseline  3+/5 R shoulder external rotation and flexion    Time  4    Period  Weeks    Status  New    Target Date  08/06/19      PT LONG TERM GOAL #2   Title  Pt will demonstrate 120 degrees Flexion and abduction in the R shoulder within 4 weeks in order to demonstrate improved functional mobility.    Baseline  R shoulder flexion 105 degrees, R shoulder abduction 112 degrees.    Time  4    Period  Weeks    Status  New    Target Date  08/06/19      PT LONG TERM GOAL #3   Title  Patient will have reduction of R brachium limb girth by 3 cm within 4 weeks to demonstrate decreased fluid.    Baseline  see measurements.    Time  4    Period  Weeks    Status  New    Target Date  08/06/19      PT LONG TERM GOAL #4   Title  Patient to be properly fitted with compression garment to wear on daily basis.    Baseline  Pt has a garment that does not fit adequately on her arm and does not have a garment for her trunk.    Time  4    Period  Weeks    Status  New    Target Date  08/06/19       PT LONG TERM  GOAL #5   Title  Pt will report 50% improvement in pain and functional mobiltiy of her RUE within 4 weeks in order to demonstrate improve quality of life.    Baseline  7/10 pain    Time  4    Period  Weeks    Status  New    Target Date  08/06/19            Plan - 07/09/19 1002    Clinical Impression Statement  Continued with complete decongestive therapy to Rt UE. Added gray foam to lateral upper arm where most increased circumference. Pt repros bandage comfortable upon leaving.    Personal Factors and Comorbidities  Comorbidity 1;Time since onset of injury/illness/exacerbation    Comorbidities  R modified radical masectomy with 22 lymph node removal    Examination-Activity Limitations  Reach Overhead    Stability/Clinical Decision Making  Stable/Uncomplicated    Rehab Potential  Good    PT Frequency  3x / week    PT Duration  4 weeks    PT Treatment/Interventions  Therapeutic activities;Therapeutic exercise;Patient/family education;Manual techniques;Neuromuscular re-education    PT Next Visit Plan  cont CDT of the Rt UE with bandage changes as necessary, easy shoulder  ROM activities AA/ROM scapular strengthening/stabilizing.    Consulted and Agree with Plan of Care  Patient       Patient will benefit from skilled therapeutic intervention in order to improve the following deficits and impairments:  Decreased strength, Decreased range of motion, Increased edema, Pain  Visit Diagnosis: Postmastectomy lymphedema  Chronic right shoulder pain  Stiffness of right shoulder, not elsewhere classified  Abnormal posture     Problem List Patient Active Problem List   Diagnosis Date Noted  . Unintentional weight loss 05/21/2019  . Acute pyelonephritis 11/16/2018  . Lymphedema of right arm 02/27/2018  . Dyspnea 09/23/2016  . Eczema 08/31/2016  . Essential hypertension 11/05/2015  . Cervical dystonia 08/25/2015  . Headache 07/09/2015  . Myofascial muscle pain  04/29/2015  . Adhesive capsulitis of right shoulder 04/29/2015  . Lower extremity edema 10/01/2014  . Malignant neoplasm of overlapping sites of right breast in female, estrogen receptor positive ([place]) 05/24/2014  . Myalgia and myositis 05/17/2014  . Neoplasm related pain 03/12/2014  . CN (constipation) 11/26/2013  . H/O reduction mammoplasty 10/30/2013  . Seizure ([place]) 10/06/2013  . Status post bilateral breast implants 08/31/2013  . S/P breast reconstruction, right 08/23/2013  . AC joint arthropathy 07/02/2013  . Routine adult health maintenance 05/29/2013  . Hypokalemia 05/21/2013  . Chronic pain 04/19/2013  . Acquired absence of breast and absent nipple 01/30/2013  . RHINOSINUSITIS, CHRONIC 04/08/2010  . DEPRESSIVE DISORDER, NOS 08/04/2006    Otelia Limes, PTA 07/09/2019, 10:05 AM  [place], [place], [address] Phone: [phone]   Fax:  [phone]  Name: Karen Hopkins MRN: [id number] Date of Birth: Jul 26, 1975

## 2019-07-10 ENCOUNTER — Telehealth: Payer: Self-pay | Admitting: Genetic Counselor

## 2019-07-10 NOTE — Telephone Encounter (Signed)
Patient is aware of video visit 2/3

## 2019-07-11 ENCOUNTER — Other Ambulatory Visit: Payer: Self-pay | Admitting: Genetic Counselor

## 2019-07-11 ENCOUNTER — Ambulatory Visit: Payer: Medicare Other | Admitting: Physical Therapy

## 2019-07-11 ENCOUNTER — Inpatient Hospital Stay: Payer: Medicare Other | Attending: Adult Health | Admitting: Genetic Counselor

## 2019-07-11 ENCOUNTER — Inpatient Hospital Stay: Payer: Medicare Other

## 2019-07-11 ENCOUNTER — Encounter: Payer: Self-pay | Admitting: Genetic Counselor

## 2019-07-11 DIAGNOSIS — Z170 Estrogen receptor positive status [ER+]: Secondary | ICD-10-CM | POA: Diagnosis not present

## 2019-07-11 DIAGNOSIS — Z853 Personal history of malignant neoplasm of breast: Secondary | ICD-10-CM | POA: Insufficient documentation

## 2019-07-11 DIAGNOSIS — C50811 Malignant neoplasm of overlapping sites of right female breast: Secondary | ICD-10-CM

## 2019-07-11 DIAGNOSIS — Z803 Family history of malignant neoplasm of breast: Secondary | ICD-10-CM | POA: Insufficient documentation

## 2019-07-11 DIAGNOSIS — Z8042 Family history of malignant neoplasm of prostate: Secondary | ICD-10-CM | POA: Diagnosis not present

## 2019-07-11 NOTE — Progress Notes (Signed)
REFERRING PROVIDER: Chauncey Cruel, MD [address] [place],  [place] [address]  PRIMARY PROVIDER:  [name], MD  PRIMARY REASON FOR VISIT:  1. Malignant neoplasm of overlapping sites of right breast in female, estrogen receptor positive ([place])   2. Family history of breast cancer   3. Family history of prostate cancer      HISTORY OF PRESENT ILLNESS:   Karen Hopkins, a 44 y.o. female, was seen for a [facility] cancer genetics consultation at the request of Dr. Jana Hakim due to a personal and family history of cancer.  Karen Hopkins presents to clinic today to discuss the possibility of a hereditary predisposition to cancer, genetic testing, and to further clarify her future cancer risks, as well as potential cancer risks for family members.   In 2011, at the age of 75, Karen Hopkins was diagnosed with invasive ductal carcinoma of the right breast.  The tumor was triple positive. The treatment plan included single mastectomy, chemotherapy and radiation.  She had genetic testing through [facility] for BRCA1 and BRCA2 sequencing only which was negative.Marland Kitchen     CANCER HISTORY:  Oncology History   No history exists.     RISK FACTORS:  Menarche was at age 4.  First live birth at age 43.  Ovaries intact: yes.  Hysterectomy: no.  Menopausal status: premenopausal.  HRT use: 0 years. Colonoscopy: yes; 1 gastric polyp and 3 TA polyps. Mammogram within the last year: yes. Number of breast biopsies: 1. Up to date with pelvic exams: yes. Any excessive radiation exposure in the past: for breast cancer treatment  Past Medical History:  Diagnosis Date  . Anxiety   . Arthritis   . Cancer ([place]) 2011   Rt. Br. Ca  . Depression   . Diabetes mellitus without complication (HCC)    has been "borderline"  . Essential hypertension 11/05/2015  . Family history of breast cancer   . Family history of prostate cancer   . History of breast cancer 2011   right  . History of  chemotherapy 2011  . History of radiation therapy 2011  . Hypertension    under control with med., has been on med. x 2 yr.  . Lymphedema of arm    right; no BP or puncture to right arm  . Personal history of chemotherapy 09/16/2009   rt breast  . Personal history of radiation therapy 05/2010   rt breast  . Pneumonia   . Seasonal allergies   . Sinus headache     Past Surgical History:  Procedure Laterality Date  . BREAST BIOPSY Right 08/26/2009  . BREAST REDUCTION SURGERY Left 08/23/2013   Procedure: LEFT BREAST REDUCTION  ;  Surgeon: Theodoro Kos, DO;  Location: [place];  Service: Plastics;  Laterality: Left;  . CESAREAN SECTION  07/15/2001; 03/18/2004; 11/21/2008  . lateral orbiotomy Left 11/2015   Dr. Toy Cookey at [facility]  . LATISSIMUS FLAP TO BREAST Right 03/12/2013   Procedure: RIGHT BREAST LATISSIMUS FLAP WITH EXPANDER PLACEMENT;  Surgeon: Theodoro Kos, DO;  Location: [place];  Service: Plastics;  Laterality: Right;  . LIPOSUCTION Bilateral 08/23/2013   Procedure: LIPOSUCTION;  Surgeon: Theodoro Kos, DO;  Location: [place];  Service: Plastics;  Laterality: Bilateral;  . MASTECTOMY Right 2011  . MODIFIED RADICAL MASTECTOMY Right 03/11/2010  . NASAL SEPTUM SURGERY    . ORIF ANKLE FRACTURE Left 07/18/2018   Procedure: OPEN REDUCTION INTERNAL FIXATION (ORIF) LEFT ANKLE FRACTURE WITH  SYNDESMOSIS  AND ANKLE ARTHROTOMY;  Surgeon: Erle Crocker, MD;  Location: [place];  Service: Orthopedics;  Laterality: Left;  . PORT-A-CATH REMOVAL Left 12/01/2010  . PORTACATH PLACEMENT Left 09/12/2009  . REDUCTION MAMMAPLASTY Left 08/2013  . REMOVAL OF TISSUE EXPANDER AND PLACEMENT OF IMPLANT Right 08/23/2013   Procedure: REMOVAL RIGHT TISSUE EXPANDER AND PLACEMENT OF IMPLANT TO RIGHT BREAST ;  Surgeon: Theodoro Kos, DO;  Location: [place];  Service: Plastics;  Laterality: Right;  . SUPRA-UMBILICAL HERNIA  [id number]  . TUBAL LIGATION   11/21/2008    Social History   Socioeconomic History  . Marital status: Single    Spouse name: Not on file  . Number of children: 3  . Years of education: 59  . Highest education level: Not on file  Occupational History  . Occupation: Disability   Tobacco Use  . Smoking status: Former Smoker    Packs/day: 0.00    Years: 0.00    Pack years: 0.00    Types: Cigarettes    Start date: 06/07/2002    Quit date: 05/08/2019    Years since quitting: 0.1  . Smokeless tobacco: Never Used  Substance and Sexual Activity  . Alcohol use: No  . Drug use: No  . Sexual activity: Yes    Birth control/protection: Surgical, I.U.D.    Comment: Mirena IUD inserted 06/04/19  Other Topics Concern  . Not on file  Social History Narrative   Lives with kids   Caffeine use: none    Social Determinants of Health   Financial Resource Strain:   . Difficulty of Paying Living Expenses: Not on file  Food Insecurity:   . Worried About Charity fundraiser in the Last Year: Not on file  . Ran Out of Food in the Last Year: Not on file  Transportation Needs:   . Lack of Transportation (Medical): Not on file  . Lack of Transportation (Non-Medical): Not on file  Physical Activity:   . Days of Exercise per Week: Not on file  . Minutes of Exercise per Session: Not on file  Stress:   . Feeling of Stress : Not on file  Social Connections:   . Frequency of Communication with Friends and Family: Not on file  . Frequency of Social Gatherings with Friends and Family: Not on file  . Attends Religious Services: Not on file  . Active Member of Clubs or Organizations: Not on file  . Attends Archivist Meetings: Not on file  . Marital Status: Not on file     FAMILY HISTORY:  We obtained a detailed, 4-generation family history.  Significant diagnoses are listed below: Family History  Problem Relation Age of Onset  . Hypertension Mother   . Breast cancer Mother 18  . Diabetes type II Father   .  Prostate cancer Father 49  . Stroke Neg Hx   . Colon polyps Neg Hx   . Esophageal cancer Neg Hx   . Pancreatic cancer Neg Hx   . Stomach cancer Neg Hx   . Rectal cancer Neg Hx     The patient has three boys who are cancer free.  She has two sisters who do not have cancer.  Her mother is deceased and her father is living.  The patient's mother was diagnosed with stage IV breast cancer at 3.  She had 10 siblings who did not have cancer.  There is no other reported maternal family history of cancer.  The patient's  father was diagnosed with prostate cancer at 39.  He has three sisters who are cancer free.  There is no other reported family history of cancer.  Karen Hopkins is unaware of previous family history of genetic testing for hereditary cancer risks. Patient's maternal ancestors are of African American descent, and paternal ancestors are of African American descent. There is no reported Ashkenazi Jewish ancestry. There is no known consanguinity.   GENETIC COUNSELING ASSESSMENT: Karen Hopkins is a 44 y.o. female with a personal and family history of breast cancer which is somewhat suggestive of a hereditary cancer syndrome and predisposition to cancer given her young age of onset. We, therefore, discussed and recommended the following at today's visit.   DISCUSSION: We discussed that 5 - 10% of breast cancer is hereditary, with most cases associated with BRCA.  There are other genes that can be associated with hereditary breast cancer syndromes. Specifically, young women who have triple positive breast cancer and are negative for BRCA mutations have an increased risk to test positive for TP53 mutations.  There are still other genes that play a role in the development of hereditary breast cancer syndromes.  These include ATM, CHEK2 and PALB2.  We discussed that testing is beneficial for several reasons including knowing how to follow individuals after completing their treatment, identifying whether  potential treatment options such as PARP inhibitors would be beneficial, and understand if other family members could be at risk for cancer and allow them to undergo genetic testing.   We reviewed the characteristics, features and inheritance patterns of hereditary cancer syndromes. We also discussed genetic testing, including the appropriate family members to test, the process of testing, insurance coverage and turn-around-time for results. We discussed the implications of a negative, positive, carrier and/or variant of uncertain significant result. We recommended Karen Hopkins pursue genetic testing for the CustomNext-Cancer+RNAinsight gene panel. The CustomNext-Cancer gene panel offered by [facility] and includes sequencing and rearrangement analysis for the following 91 genes: AIP, ALK, APC*, ATM*, AXIN2, BAP1, BARD1, BLM, BMPR1A, BRCA1*, BRCA2*, BRIP1*, CDC73, CDH1*, CDK4, CDKN1B, CDKN2A, CHEK2*, CTNNA1, DICER1, FANCC, FH, FLCN, GALNT12, KIF1B, LZTR1, MAX, MEN1, MET, MLH1*, MRE11A, MSH2*, MSH3, MSH6*, MUTYH*, NBN, NF1*, NF2, NTHL1, PALB2*, PHOX2B, PMS2*, POT1, PRKAR1A, PTCH1, PTEN*, RAD50, RAD51C*, RAD51D*, RB1, RECQL, RET, SDHA, SDHAF2, SDHB, SDHC, SDHD, SMAD4, SMARCA4, SMARCB1, SMARCE1, STK11, SUFU, TMEM127, TP53*, TSC1, TSC2, VHL and XRCC2 (sequencing and deletion/duplication); CASR, CFTR, CPA1, CTRC, EGFR, EGLN1, FAM175A, HOXB13, KIT, MITF, MLH3, PALLD, PDGFRA, POLD1, POLE, PRSS1, RINT1, RPS20, SPINK1 and TERT (sequencing only); EPCAM and GREM1 (deletion/duplication only). DNA and RNA analyses performed for * genes.   Based on Karen Hopkins's personal and family history of cancer, she meets medical criteria for genetic testing. Despite that she meets criteria, she may still have an out of pocket cost. We discussed that if her out of pocket cost for testing is over $100, the laboratory will call and confirm whether she wants to proceed with testing.  If the out of pocket cost of testing is less than $100  she will be billed by the genetic testing laboratory.   PLAN: After considering the risks, benefits, and limitations, Karen Hopkins provided informed consent to pursue genetic testing and the blood sample was sent to [facility] for analysis of the CustomNext-Cancer+RNAinsight. Results should be available within approximately 2-3 weeks' time, at which point they will be disclosed by telephone to Karen Hopkins, as will any additional recommendations warranted by these results. Karen Hopkins will receive a  summary of her genetic counseling visit and a copy of her results once available. This information will also be available in Epic.   Lastly, we encouraged Karen Hopkins to remain in contact with cancer genetics annually so that we can continuously update the family history and inform her of any changes in cancer genetics and testing that may be of benefit for this family.   Karen Hopkins questions were answered to her satisfaction today. Our contact information was provided should additional questions or concerns arise. Thank you for the referral and allowing [place] to share in the care of your patient.   Kennan Detter P. Florene Glen, [place], [facility] Licensed, Insurance risk surveyor [name] phone: [phone]  The patient was seen for a total of 35 minutes in face-to-face genetic counseling.  This patient was discussed with Drs. Magrinat, Lindi Adie and/or Burr Medico who agrees with the above.    _______________________________________________________________________ For Office Staff:  Number of people involved in session: 1 Was an Intern/ student involved with case: no

## 2019-07-12 ENCOUNTER — Other Ambulatory Visit: Payer: Medicare Other

## 2019-07-12 DIAGNOSIS — Z803 Family history of malignant neoplasm of breast: Secondary | ICD-10-CM | POA: Diagnosis not present

## 2019-07-12 DIAGNOSIS — Z853 Personal history of malignant neoplasm of breast: Secondary | ICD-10-CM | POA: Diagnosis not present

## 2019-07-13 ENCOUNTER — Encounter: Payer: Self-pay | Admitting: Physical Therapy

## 2019-07-13 ENCOUNTER — Ambulatory Visit: Payer: Medicare Other | Admitting: Physical Therapy

## 2019-07-13 ENCOUNTER — Other Ambulatory Visit: Payer: Self-pay

## 2019-07-13 ENCOUNTER — Ambulatory Visit (INDEPENDENT_AMBULATORY_CARE_PROVIDER_SITE_OTHER): Payer: Medicare Other | Admitting: Plastic Surgery

## 2019-07-13 ENCOUNTER — Encounter: Payer: Self-pay | Admitting: Plastic Surgery

## 2019-07-13 VITALS — BP 119/81 | HR 57 | Temp 98.0°F | Ht 64.0 in | Wt 183.6 lb

## 2019-07-13 DIAGNOSIS — C50811 Malignant neoplasm of overlapping sites of right female breast: Secondary | ICD-10-CM

## 2019-07-13 DIAGNOSIS — M25511 Pain in right shoulder: Secondary | ICD-10-CM | POA: Diagnosis not present

## 2019-07-13 DIAGNOSIS — N651 Disproportion of reconstructed breast: Secondary | ICD-10-CM | POA: Insufficient documentation

## 2019-07-13 DIAGNOSIS — Z9882 Breast implant status: Secondary | ICD-10-CM | POA: Diagnosis not present

## 2019-07-13 DIAGNOSIS — R293 Abnormal posture: Secondary | ICD-10-CM | POA: Diagnosis not present

## 2019-07-13 DIAGNOSIS — Z170 Estrogen receptor positive status [ER+]: Secondary | ICD-10-CM | POA: Diagnosis not present

## 2019-07-13 DIAGNOSIS — Z9889 Other specified postprocedural states: Secondary | ICD-10-CM | POA: Diagnosis not present

## 2019-07-13 DIAGNOSIS — G8929 Other chronic pain: Secondary | ICD-10-CM | POA: Diagnosis not present

## 2019-07-13 DIAGNOSIS — I972 Postmastectomy lymphedema syndrome: Secondary | ICD-10-CM | POA: Diagnosis not present

## 2019-07-13 DIAGNOSIS — M25611 Stiffness of right shoulder, not elsewhere classified: Secondary | ICD-10-CM | POA: Diagnosis not present

## 2019-07-13 NOTE — Progress Notes (Addendum)
Patient ID: Karen Hopkins, female    DOB: 1975/12/20, 44 y.o.   MRN: [id number]   Chief Complaint  Patient presents with  . Breast Problem    The patient is a 44 year old black female here for consultation for revisional beast reconstruction.  In 2001 the patient noticed skin thickening and redness of the right breast. A work-up was done (mammagram, U/S, Bx and MRI) which resulted and finding right upper outer quadrant invasive ductal carcinoma.  It was estrogen / positive with a strong amplification of HER-2.  She was treated with neoadjuvant chemotherapy followed by a right modified radical mastectomy.  She had radiation which finished in 2012.  She has residual right arm lymphedema that she treats with compression.  In 2014 / 2015 she underwent reconstruction with a right latissimus muscle flap and implant placement.  The implant is a Armed forces logistics/support/administrative officer [place] Ultra High Profile Gel 535 cc. Had to be ultra high due to the 535 cc. She had a left breast reduction with over 1300 g removed for symmetry (inferior pedical technique).  Since then she has lost approximately 80 pounds.  She is very pleased and has done a great job with her weight loss.  The patient is 5 feet 4 inches tall and weighs 184 pounds.  As a result she has significant asymmetry of her breasts.  She has not had nipple areolar reconstruction on the right breast.  The left incisions have healed very nicely.    Review of Systems  Constitutional: Negative for activity change and appetite change.  HENT: Negative.   Eyes: Negative.   Respiratory: Negative.  Negative for chest tightness.   Cardiovascular: Negative.   Gastrointestinal: Negative.   Endocrine: Negative.   Genitourinary: Negative.   Musculoskeletal: Negative.   Skin: Negative.  Negative for wound.  Hematological: Negative.     Past Medical History:  Diagnosis Date  . Anxiety   . Arthritis   . Cancer ([place]) 2011   Rt. Br. Ca  . Depression   . Diabetes mellitus  without complication (HCC)    has been "borderline"  . Essential hypertension 11/05/2015  . Family history of breast cancer   . Family history of prostate cancer   . History of breast cancer 2011   right  . History of chemotherapy 2011  . History of radiation therapy 2011  . Hypertension    under control with med., has been on med. x 2 yr.  . Lymphedema of arm    right; no BP or puncture to right arm  . Personal history of chemotherapy 09/16/2009   rt breast  . Personal history of radiation therapy 05/2010   rt breast  . Pneumonia   . Seasonal allergies   . Sinus headache     Past Surgical History:  Procedure Laterality Date  . BREAST BIOPSY Right 08/26/2009  . BREAST REDUCTION SURGERY Left 08/23/2013   Procedure: LEFT BREAST REDUCTION  ;  Surgeon: Theodoro Kos, DO;  Location: [place];  Service: Plastics;  Laterality: Left;  . CESAREAN SECTION  07/15/2001; 03/18/2004; 11/21/2008  . lateral orbiotomy Left 11/2015   [facility]key [facility]  . LATISSIMUS FLAP TO BREAST Right 03/12/2013   Procedure: RIGHT BREAST LATISSIMUS FLAP WITH EXPANDER PLACEMENT;  Surgeon: Theodoro Kos, DO;  Location: [place];  Service: Plastics;  Laterality: Right;  . LIPOSUCTION Bilateral 08/23/2013   Procedure: LIPOSUCTION;  Surgeon: Theodoro Kos, DO;  Location: [place];  Service: Plastics;  Laterality: Bilateral;  . MASTECTOMY Right 2011  . MODIFIED RADICAL MASTECTOMY Right 03/11/2010  . NASAL SEPTUM SURGERY    . ORIF ANKLE FRACTURE Left 07/18/2018   Procedure: OPEN REDUCTION INTERNAL FIXATION (ORIF) LEFT ANKLE FRACTURE WITH  SYNDESMOSIS AND ANKLE ARTHROTOMY;  Surgeon: Erle Crocker, MD;  Location: [place];  Service: Orthopedics;  Laterality: Left;  . PORT-A-CATH REMOVAL Left 12/01/2010  . PORTACATH PLACEMENT Left 09/12/2009  . REDUCTION MAMMAPLASTY Left 08/2013  . REMOVAL OF TISSUE EXPANDER AND PLACEMENT OF IMPLANT Right 08/23/2013   Procedure: REMOVAL RIGHT  TISSUE EXPANDER AND PLACEMENT OF IMPLANT TO RIGHT BREAST ;  Surgeon: Theodoro Kos, DO;  Location: [place];  Service: Plastics;  Laterality: Right;  . SUPRA-UMBILICAL HERNIA  [id number]  . TUBAL LIGATION  11/21/2008      Current Outpatient Medications:  .  albuterol (PROAIR HFA) 108 (90 Base) MCG/ACT inhaler, Inhale 2 puffs into the lungs every 6 (six) hours as needed for wheezing or shortness of breath., Disp: 18 g, Rfl: 3 .  levonorgestrel (MIRENA) 20 MCG/24HR IUD, 1 each by Intrauterine route once., Disp: , Rfl:  .  potassium chloride (MICRO-K) 10 MEQ CR capsule, TAKE 1 CAPSULE BY MOUTH 2 TIMES DAILY., Disp: 30 capsule, Rfl: 3 .  topiramate (TOPAMAX) 50 MG tablet, TAKE 3 TABLETS BY MOUTH AT BEDTIME, Disp: 90 tablet, Rfl: 0   Objective:   Vitals:   07/13/19 1331  BP: 119/81  Pulse: (!) 57  Temp: 98 F (36.7 C)  SpO2: 100%    Physical Exam Vitals and nursing note reviewed.  Constitutional:      Appearance: Normal appearance.  HENT:     Head: Normocephalic and atraumatic.  Cardiovascular:     Rate and Rhythm: Normal rate.  Pulmonary:     Effort: Pulmonary effort is normal.  Abdominal:     General: Abdomen is flat.  Skin:    General: Skin is warm.  Neurological:     Mental Status: She is alert.  Psychiatric:        Mood and Affect: Mood normal.        Thought Content: Thought content normal.     Assessment & Plan:  Status post bilateral breast implants  Malignant neoplasm of overlapping sites of right breast in female, estrogen receptor positive ([place])  H/O reduction mammoplasty  Breast asymmetry following reconstructive surgery  Due to the patient's change in weight she is a good candidate for revision reconstruction.  I think she would do well with a left mastopexy with liposuction and a right nipple areolar reconstruction.  We will have to be very careful on the right side due to her previous radiation.  Pictures were obtained of the patient and  placed in the chart with the patient's or guardian's permission.   Wallace Going, DO    The 21st Century Cures Act was signed into law in 2016 which includes the topic of electronic health records.  This provides immediate access to information in MyChart.  This includes consultation notes, operative notes, office notes, lab results and pathology reports.  If you have any questions about what you read please let us know at your next visit or call us at the office.  We are right here with you.

## 2019-07-13 NOTE — Therapy (Signed)
[place], [place], [address] Phone: [phone]   Fax:  [phone]  Physical Therapy Treatment  Patient Details  Name: Karen Hopkins MRN: [id number] Date of Birth: [birth year] Referring Provider (PT): Hale Bogus MD   Encounter Date: 07/13/2019  PT End of Session - 07/13/19 1151    Visit Number  5    Number of Visits  13    Date for PT Re-Evaluation  08/06/19    PT Start Time  1100    PT Stop Time  1151    PT Time Calculation (min)  51 min    Activity Tolerance  Patient tolerated treatment well    Behavior During Therapy  [facility] for tasks assessed/performed       Past Medical History:  Diagnosis Date  . Anxiety   . Arthritis   . Cancer ([place]) 2011   Rt. Br. Ca  . Depression   . Diabetes mellitus without complication (HCC)    has been "borderline"  . Essential hypertension 11/05/2015  . Family history of breast cancer   . Family history of prostate cancer   . History of breast cancer 2011   right  . History of chemotherapy 2011  . History of radiation therapy 2011  . Hypertension    under control with med., has been on med. x 2 yr.  . Lymphedema of arm    right; no BP or puncture to right arm  . Personal history of chemotherapy 09/16/2009   rt breast  . Personal history of radiation therapy 05/2010   rt breast  . Pneumonia   . Seasonal allergies   . Sinus headache     Past Surgical History:  Procedure Laterality Date  . BREAST BIOPSY Right 08/26/2009  . BREAST REDUCTION SURGERY Left 08/23/2013   Procedure: LEFT BREAST REDUCTION  ;  Surgeon: Theodoro Kos, DO;  Location: [place];  Service: Plastics;  Laterality: Left;  . CESAREAN SECTION  07/15/2001; 03/18/2004; 11/21/2008  . lateral orbiotomy Left 11/2015   Dr. Toy Cookey at [facility]  . LATISSIMUS FLAP TO BREAST Right 03/12/2013   Procedure: RIGHT BREAST LATISSIMUS FLAP WITH EXPANDER PLACEMENT;  Surgeon: Theodoro Kos, DO;   Location: [place];  Service: Plastics;  Laterality: Right;  . LIPOSUCTION Bilateral 08/23/2013   Procedure: LIPOSUCTION;  Surgeon: Theodoro Kos, DO;  Location: [place];  Service: Plastics;  Laterality: Bilateral;  . MASTECTOMY Right 2011  . MODIFIED RADICAL MASTECTOMY Right 03/11/2010  . NASAL SEPTUM SURGERY    . ORIF ANKLE FRACTURE Left 07/18/2018   Procedure: OPEN REDUCTION INTERNAL FIXATION (ORIF) LEFT ANKLE FRACTURE WITH  SYNDESMOSIS AND ANKLE ARTHROTOMY;  Surgeon: Erle Crocker, MD;  Location: [place];  Service: Orthopedics;  Laterality: Left;  . PORT-A-CATH REMOVAL Left 12/01/2010  . PORTACATH PLACEMENT Left 09/12/2009  . REDUCTION MAMMAPLASTY Left 08/2013  . REMOVAL OF TISSUE EXPANDER AND PLACEMENT OF IMPLANT Right 08/23/2013   Procedure: REMOVAL RIGHT TISSUE EXPANDER AND PLACEMENT OF IMPLANT TO RIGHT BREAST ;  Surgeon: Theodoro Kos, DO;  Location: [place];  Service: Plastics;  Laterality: Right;  . SUPRA-UMBILICAL HERNIA  [id number]  . TUBAL LIGATION  11/21/2008    There were no vitals filed for this visit.  Subjective Assessment - 07/13/19 1105    Subjective  I took my bandages off yesterday so I could take a bath. My fingers were ok after not wrapping them last time.    Pertinent  History  R masectomy with reconstruction and 22 lymph node removal    Patient Stated Goals  I want to get my swelling under control so I can wear regular shirts.    Currently in Pain?  No/denies    Pain Score  0-No pain                  Outpatient Rehab from 07/02/2019 in [place]  Lymphedema Life Impact Scale Total Score  63.24 %           [facility] Adult PT Treatment/Exercise - 07/13/19 0001      Manual Therapy   Manual Lymphatic Drainage (MLD)  in supine: short neck, superficial and deep abdominals, Lt axillary and Rt inguinal nodes, anterior inter-axillary anastomosis, R axillo-inguinal  anastomosis,then Rt UE as follows: lateral R brachium, medial to lateral R brachium, lateral R brachium, all surfaces of antebrachium and dorsum of the hand. Re-worked all surfaces redirecting to anastomosis    Compression Bandaging  lotion, thin stockinette, artiflex x1 (with 1/2" gray foam to upper lateral arm) to the hand and up the arm, 1 6cm hand bandage, 1 8cm, and 2 10cm bandages from wrist to axilla.               PT Short Term Goals - 07/02/19 1731      PT SHORT TERM GOAL #1   Title  Pt will be independent with HEP and MLD within 2 weeks in order to demonstrate autonomy of care.    Baseline  pt currently is not performing MLD and does not have an HEP    Time  4    Period  Weeks    Status  New    Target Date  07/23/19        PT Long Term Goals - 07/02/19 1731      PT LONG TERM GOAL #1   Title  Pt will demonstrate 4/5 strength for R shoulder external rotation and flexion to demonstrate improved functional strength.    Baseline  3+/5 R shoulder external rotation and flexion    Time  4    Period  Weeks    Status  New    Target Date  08/06/19      PT LONG TERM GOAL #2   Title  Pt will demonstrate 120 degrees Flexion and abduction in the R shoulder within 4 weeks in order to demonstrate improved functional mobility.    Baseline  R shoulder flexion 105 degrees, R shoulder abduction 112 degrees.    Time  4    Period  Weeks    Status  New    Target Date  08/06/19      PT LONG TERM GOAL #3   Title  Patient will have reduction of R brachium limb girth by 3 cm within 4 weeks to demonstrate decreased fluid.    Baseline  see measurements.    Time  4    Period  Weeks    Status  New    Target Date  08/06/19      PT LONG TERM GOAL #4   Title  Patient to be properly fitted with compression garment to wear on daily basis.    Baseline  Pt has a garment that does not fit adequately on her arm and does not have a garment for her trunk.    Time  4    Period  Weeks    Status   New    Target  Date  08/06/19      PT LONG TERM GOAL #5   Title  Pt will report 50% improvement in pain and functional mobiltiy of her RUE within 4 weeks in order to demonstrate improve quality of life.    Baseline  7/10 pain    Time  4    Period  Weeks    Status  New    Target Date  08/06/19            Plan - 07/13/19 1151    Clinical Impression Statement  Continued with complete decongestive therapy to RUE. Re applied gray foam to upper arm where pt demosntrates the majority of her swelling. She did not have an increase in finger swelling without wearing finger bandages so did not apply them again today.    PT Frequency  3x / week    PT Duration  4 weeks    PT Treatment/Interventions  Therapeutic activities;Therapeutic exercise;Patient/family education;Manual techniques;Neuromuscular re-education    PT Next Visit Plan  cont CDT of the Rt UE with bandage changes as necessary, easy shoulder  ROM activities AA/ROM scapular strengthening/stabilizing.    Consulted and Agree with Plan of Care  Patient       Patient will benefit from skilled therapeutic intervention in order to improve the following deficits and impairments:  Decreased strength, Decreased range of motion, Increased edema, Pain  Visit Diagnosis: Postmastectomy lymphedema     Problem List Patient Active Problem List   Diagnosis Date Noted  . Family history of breast cancer   . Family history of prostate cancer   . Unintentional weight loss 05/21/2019  . Acute pyelonephritis 11/16/2018  . Lymphedema of right arm 02/27/2018  . Dyspnea 09/23/2016  . Eczema 08/31/2016  . Essential hypertension 11/05/2015  . Cervical dystonia 08/25/2015  . Headache 07/09/2015  . Myofascial muscle pain 04/29/2015  . Adhesive capsulitis of right shoulder 04/29/2015  . Lower extremity edema 10/01/2014  . Malignant neoplasm of overlapping sites of right breast in female, estrogen receptor positive ([place]) 05/24/2014  . Myalgia and  myositis 05/17/2014  . Neoplasm related pain 03/12/2014  . CN (constipation) 11/26/2013  . H/O reduction mammoplasty 10/30/2013  . Seizure ([place]) 10/06/2013  . Status post bilateral breast implants 08/31/2013  . S/P breast reconstruction, right 08/23/2013  . AC joint arthropathy 07/02/2013  . Routine adult health maintenance 05/29/2013  . Hypokalemia 05/21/2013  . Chronic pain 04/19/2013  . Acquired absence of breast and absent nipple 01/30/2013  . RHINOSINUSITIS, CHRONIC 04/08/2010  . DEPRESSIVE DISORDER, NOS 08/04/2006    [facility] 07/13/2019, 11:53 AM  [place], [place], [address] Phone: [phone]   Fax:  [phone]  Name: Karen Hopkins MRN: [id number] Date of Birth: 30-Dec-1975  Manus Gunning, PT 07/13/19 11:53 AM

## 2019-07-16 ENCOUNTER — Ambulatory Visit: Payer: Medicare Other

## 2019-07-16 ENCOUNTER — Other Ambulatory Visit: Payer: Self-pay

## 2019-07-16 DIAGNOSIS — M25611 Stiffness of right shoulder, not elsewhere classified: Secondary | ICD-10-CM | POA: Diagnosis not present

## 2019-07-16 DIAGNOSIS — I972 Postmastectomy lymphedema syndrome: Secondary | ICD-10-CM | POA: Diagnosis not present

## 2019-07-16 DIAGNOSIS — R293 Abnormal posture: Secondary | ICD-10-CM

## 2019-07-16 DIAGNOSIS — G8929 Other chronic pain: Secondary | ICD-10-CM

## 2019-07-16 DIAGNOSIS — M25511 Pain in right shoulder: Secondary | ICD-10-CM | POA: Diagnosis not present

## 2019-07-16 NOTE — Therapy (Signed)
[place], [place], [address] Phone: [phone]   Fax:  [phone]  Physical Therapy Treatment  Patient Details  Name: Karen Hopkins MRN: [id number] Date of Birth: 07/31/1975 Referring Provider (PT): Hale Bogus MD   Encounter Date: 07/16/2019  PT End of Session - 07/16/19 1012    Visit Number  6    Number of Visits  13    Date for PT Re-Evaluation  08/06/19    PT Start Time  1010    PT Stop Time  1100    PT Time Calculation (min)  50 min    Activity Tolerance  Patient tolerated treatment well    Behavior During Therapy  [facility] for tasks assessed/performed       Past Medical History:  Diagnosis Date  . Anxiety   . Arthritis   . Cancer ([place]) 2011   Rt. Br. Ca  . Depression   . Diabetes mellitus without complication (HCC)    has been "borderline"  . Essential hypertension 11/05/2015  . Family history of breast cancer   . Family history of prostate cancer   . History of breast cancer 2011   right  . History of chemotherapy 2011  . History of radiation therapy 2011  . Hypertension    under control with med., has been on med. x 2 yr.  . Lymphedema of arm    right; no BP or puncture to right arm  . Personal history of chemotherapy 09/16/2009   rt breast  . Personal history of radiation therapy 05/2010   rt breast  . Pneumonia   . Seasonal allergies   . Sinus headache     Past Surgical History:  Procedure Laterality Date  . BREAST BIOPSY Right 08/26/2009  . BREAST REDUCTION SURGERY Left 08/23/2013   Procedure: LEFT BREAST REDUCTION  ;  Surgeon: Theodoro Kos, DO;  Location: [place];  Service: Plastics;  Laterality: Left;  . CESAREAN SECTION  07/15/2001; 03/18/2004; 11/21/2008  . lateral orbiotomy Left 11/2015   Dr. Toy Cookey at [facility]  . LATISSIMUS FLAP TO BREAST Right 03/12/2013   Procedure: RIGHT BREAST LATISSIMUS FLAP WITH EXPANDER PLACEMENT;  Surgeon: Theodoro Kos, DO;   Location: [place];  Service: Plastics;  Laterality: Right;  . LIPOSUCTION Bilateral 08/23/2013   Procedure: LIPOSUCTION;  Surgeon: Theodoro Kos, DO;  Location: [place];  Service: Plastics;  Laterality: Bilateral;  . MASTECTOMY Right 2011  . MODIFIED RADICAL MASTECTOMY Right 03/11/2010  . NASAL SEPTUM SURGERY    . ORIF ANKLE FRACTURE Left 07/18/2018   Procedure: OPEN REDUCTION INTERNAL FIXATION (ORIF) LEFT ANKLE FRACTURE WITH  SYNDESMOSIS AND ANKLE ARTHROTOMY;  Surgeon: Erle Crocker, MD;  Location: [place];  Service: Orthopedics;  Laterality: Left;  . PORT-A-CATH REMOVAL Left 12/01/2010  . PORTACATH PLACEMENT Left 09/12/2009  . REDUCTION MAMMAPLASTY Left 08/2013  . REMOVAL OF TISSUE EXPANDER AND PLACEMENT OF IMPLANT Right 08/23/2013   Procedure: REMOVAL RIGHT TISSUE EXPANDER AND PLACEMENT OF IMPLANT TO RIGHT BREAST ;  Surgeon: Theodoro Kos, DO;  Location: [place];  Service: Plastics;  Laterality: Right;  . SUPRA-UMBILICAL HERNIA  123456  . TUBAL LIGATION  11/21/2008    There were no vitals filed for this visit.  Subjective Assessment - 07/16/19 1012    Subjective  Pt reports that she woke up and is just sore today. She states that she thinks that maybe she slept wrong and her L shoulder is  sore. She states that her R elbow is sore on the anterior side.    Pertinent History  R masectomy with reconstruction and 22 lymph node removal    Patient Stated Goals  I want to get my swelling under control so I can wear regular shirts.    Currently in Pain?  No/denies    Pain Score  0-No pain                  Outpatient Rehab from 07/02/2019 in [place]  Lymphedema Life Impact Scale Total Score  63.24 %           [facility] Adult PT Treatment/Exercise - 07/16/19 0001      Manual Therapy   Manual Therapy  Manual Lymphatic Drainage (MLD);Compression Bandaging    Manual Lymphatic Drainage (MLD)  in  supine: short neck, superficial and deep abdominals, Bil axillary and Rt inguinal nodes, anterior inter-axillary anastomosis, R axillo-inguinal anastomosis,then Rt UE as follows: lateral R brachium, medial to lateral R brachium, lateral R brachium followed by re-working anastomosis 5x, all surfaces of antebrachium and dorsum of the hand. Re-worked all surfaces redirecting to anastomosis    Compression Bandaging  thin stockinette, artiflex x1 (with 1/2" gray foam to upper lateral arm) to the hand and up the arm, 1 6cm hand bandage, 1 8cm, and 2 10cm bandages from wrist to axilla.             PT Education - 07/16/19 1104    Education Details  Pt educated that she needs to get measured soon for garments. Discussed possiblities in the event that her insurance does not cover a garment including the price differences between custom and off the shelf garments.    Beebe(s) Educated  Patient    Methods  Explanation    Comprehension  Verbalized understanding       PT Short Term Goals - 07/02/19 1731      PT SHORT TERM GOAL #1   Title  Pt will be independent with HEP and MLD within 2 weeks in order to demonstrate autonomy of care.    Baseline  pt currently is not performing MLD and does not have an HEP    Time  4    Period  Weeks    Status  New    Target Date  07/23/19        PT Long Term Goals - 07/02/19 1731      PT LONG TERM GOAL #1   Title  Pt will demonstrate 4/5 strength for R shoulder external rotation and flexion to demonstrate improved functional strength.    Baseline  3+/5 R shoulder external rotation and flexion    Time  4    Period  Weeks    Status  New    Target Date  08/06/19      PT LONG TERM GOAL #2   Title  Pt will demonstrate 120 degrees Flexion and abduction in the R shoulder within 4 weeks in order to demonstrate improved functional mobility.    Baseline  R shoulder flexion 105 degrees, R shoulder abduction 112 degrees.    Time  4    Period  Weeks    Status   New    Target Date  08/06/19      PT LONG TERM GOAL #3   Title  Patient will have reduction of R brachium limb girth by 3 cm within 4 weeks to demonstrate decreased fluid.    Baseline  see measurements.    Time  4    Period  Weeks    Status  New    Target Date  08/06/19      PT LONG TERM GOAL #4   Title  Patient to be properly fitted with compression garment to wear on daily basis.    Baseline  Pt has a garment that does not fit adequately on her arm and does not have a garment for her trunk.    Time  4    Period  Weeks    Status  New    Target Date  08/06/19      PT LONG TERM GOAL #5   Title  Pt will report 50% improvement in pain and functional mobiltiy of her RUE within 4 weeks in order to demonstrate improve quality of life.    Baseline  7/10 pain    Time  4    Period  Weeks    Status  New    Target Date  08/06/19            Plan - 07/16/19 1012    Clinical Impression Statement  Continued with MLD and compression wrapping this session for the RUE. Re-applied gray foam to the R brachium due to significant decrease in fibrotic tissue as demonstrated by softening. No visible increase in swelling in the digits on the R hand following pt wrap last session w/o finger wrapping. Her fingers were not wrapped again this session. Discussed possibilities for arm garments with patient due to her insurance will most likely not cover compression garment and she wil speak with her insurance/garment representative about options. Pt was assisted with signing up to be measured for a garment on Friday 07/20/19 at 9:30 am before her appointment. Pt will benefit from continued POC at this time.    Personal Factors and Comorbidities  Comorbidity 1;Time since onset of injury/illness/exacerbation    Comorbidities  R modified radical masectomy with 22 lymph node removal    Examination-Activity Limitations  Reach Overhead    Rehab Potential  Good    PT Frequency  3x / week    PT Duration  4 weeks     PT Treatment/Interventions  Therapeutic activities;Therapeutic exercise;Patient/family education;Manual techniques;Neuromuscular re-education    PT Next Visit Plan  cont CDT of the Rt UE with bandage changes as necessary, easy shoulder  ROM activities AA/ROM scapular strengthening/stabilizing.    Recommended Other Services  script sent for bra, sleeves andpump,, to be measured by Lenna Sciara at 9:30 am on 07/20/19    Consulted and Agree with Plan of Care  Patient       Patient will benefit from skilled therapeutic intervention in order to improve the following deficits and impairments:  Decreased strength, Decreased range of motion, Increased edema, Pain  Visit Diagnosis: Postmastectomy lymphedema  Chronic right shoulder pain  Stiffness of right shoulder, not elsewhere classified  Abnormal posture     Problem List Patient Active Problem List   Diagnosis Date Noted  . Breast asymmetry following reconstructive surgery 07/13/2019  . Family history of breast cancer   . Family history of prostate cancer   . Unintentional weight loss 05/21/2019  . Acute pyelonephritis 11/16/2018  . Lymphedema of right arm 02/27/2018  . Dyspnea 09/23/2016  . Eczema 08/31/2016  . Essential hypertension 11/05/2015  . Cervical dystonia 08/25/2015  . Headache 07/09/2015  . Myofascial muscle pain 04/29/2015  . Adhesive capsulitis of right shoulder 04/29/2015  . Lower extremity edema 10/01/2014  .  Malignant neoplasm of overlapping sites of right breast in female, estrogen receptor positive ([place]) 05/24/2014  . Myalgia and myositis 05/17/2014  . Neoplasm related pain 03/12/2014  . CN (constipation) 11/26/2013  . H/O reduction mammoplasty 10/30/2013  . Seizure ([place]) 10/06/2013  . Status post bilateral breast implants 08/31/2013  . S/P breast reconstruction, right 08/23/2013  . AC joint arthropathy 07/02/2013  . Routine adult health maintenance 05/29/2013  . Hypokalemia 05/21/2013  . Chronic pain  04/19/2013  . Acquired absence of breast and absent nipple 01/30/2013  . RHINOSINUSITIS, CHRONIC 04/08/2010  . DEPRESSIVE DISORDER, NOS 08/04/2006    Ander Purpura, PT 07/16/2019, 11:06 AM  [place], [place], [address] Phone: [phone]   Fax:  [phone]  Name: Karen Hopkins MRN: [id number] Date of Birth: 11/12/1975

## 2019-07-20 ENCOUNTER — Ambulatory Visit: Payer: Medicare Other

## 2019-07-20 ENCOUNTER — Other Ambulatory Visit: Payer: Self-pay

## 2019-07-20 DIAGNOSIS — R293 Abnormal posture: Secondary | ICD-10-CM

## 2019-07-20 DIAGNOSIS — M25611 Stiffness of right shoulder, not elsewhere classified: Secondary | ICD-10-CM

## 2019-07-20 DIAGNOSIS — I972 Postmastectomy lymphedema syndrome: Secondary | ICD-10-CM | POA: Diagnosis not present

## 2019-07-20 DIAGNOSIS — G8929 Other chronic pain: Secondary | ICD-10-CM

## 2019-07-20 DIAGNOSIS — M25511 Pain in right shoulder: Secondary | ICD-10-CM

## 2019-07-20 NOTE — Therapy (Signed)
[place], [place], [address] Phone: [phone]   Fax:  [phone]  Physical Therapy Treatment  Patient Details  Name: Karen Hopkins MRN: [id number] Date of Birth: 1975-09-03 Referring Provider (PT): Hale Bogus MD   Encounter Date: 07/20/2019  PT End of Session - 07/20/19 1009    Visit Number  7    Number of Visits  13    Date for PT Re-Evaluation  08/06/19    PT Start Time  1006    PT Stop Time  1055    PT Time Calculation (min)  49 min    Activity Tolerance  Patient tolerated treatment well    Behavior During Therapy  [facility] for tasks assessed/performed       Past Medical History:  Diagnosis Date  . Anxiety   . Arthritis   . Cancer ([place]) 2011   Rt. Br. Ca  . Depression   . Diabetes mellitus without complication (HCC)    has been "borderline"  . Essential hypertension 11/05/2015  . Family history of breast cancer   . Family history of prostate cancer   . History of breast cancer 2011   right  . History of chemotherapy 2011  . History of radiation therapy 2011  . Hypertension    under control with med., has been on med. x 2 yr.  . Lymphedema of arm    right; no BP or puncture to right arm  . Personal history of chemotherapy 09/16/2009   rt breast  . Personal history of radiation therapy 05/2010   rt breast  . Pneumonia   . Seasonal allergies   . Sinus headache     Past Surgical History:  Procedure Laterality Date  . BREAST BIOPSY Right 08/26/2009  . BREAST REDUCTION SURGERY Left 08/23/2013   Procedure: LEFT BREAST REDUCTION  ;  Surgeon: Theodoro Kos, DO;  Location: [place];  Service: Plastics;  Laterality: Left;  . CESAREAN SECTION  07/15/2001; 03/18/2004; 11/21/2008  . lateral orbiotomy Left 11/2015   Dr. Toy Cookey at [facility]  . LATISSIMUS FLAP TO BREAST Right 03/12/2013   Procedure: RIGHT BREAST LATISSIMUS FLAP WITH EXPANDER PLACEMENT;  Surgeon: Theodoro Kos, DO;   Location: [place];  Service: Plastics;  Laterality: Right;  . LIPOSUCTION Bilateral 08/23/2013   Procedure: LIPOSUCTION;  Surgeon: Theodoro Kos, DO;  Location: [place];  Service: Plastics;  Laterality: Bilateral;  . MASTECTOMY Right 2011  . MODIFIED RADICAL MASTECTOMY Right 03/11/2010  . NASAL SEPTUM SURGERY    . ORIF ANKLE FRACTURE Left 07/18/2018   Procedure: OPEN REDUCTION INTERNAL FIXATION (ORIF) LEFT ANKLE FRACTURE WITH  SYNDESMOSIS AND ANKLE ARTHROTOMY;  Surgeon: Erle Crocker, MD;  Location: [place];  Service: Orthopedics;  Laterality: Left;  . PORT-A-CATH REMOVAL Left 12/01/2010  . PORTACATH PLACEMENT Left 09/12/2009  . REDUCTION MAMMAPLASTY Left 08/2013  . REMOVAL OF TISSUE EXPANDER AND PLACEMENT OF IMPLANT Right 08/23/2013   Procedure: REMOVAL RIGHT TISSUE EXPANDER AND PLACEMENT OF IMPLANT TO RIGHT BREAST ;  Surgeon: Theodoro Kos, DO;  Location: [place];  Service: Plastics;  Laterality: Right;  . SUPRA-UMBILICAL HERNIA  [id number]  . TUBAL LIGATION  11/21/2008    There were no vitals filed for this visit.  Subjective Assessment - 07/20/19 1009    Subjective  Pt reports that she is just sore most likely due to the weather in the same spots as usual.    Pertinent History  R  masectomy with reconstruction and 22 lymph node removal    Patient Stated Goals  I want to get my swelling under control so I can wear regular shirts.    Currently in Pain?  Yes    Pain Score  5     Pain Location  Shoulder    Pain Orientation  Right    Pain Descriptors / Indicators  Aching    Pain Type  Chronic pain    Pain Radiating Towards  shoulder to R cervical area    Pain Onset  More than a month ago    Pain Frequency  Intermittent                  Outpatient Re[facility]  Lymphedema Life Impact Scale Total Score  63.24 %           [facility] Adult PT Treatment/Exercise - 07/20/19  0001      Manual Therapy   Manual Therapy  Manual Lymphatic Drainage (MLD);Compression Bandaging    Manual Lymphatic Drainage (MLD)  in supine: short neck, superficial and deep abdominals, Bil axillary and Rt inguinal nodes, anterior inter-axillary anastomosis, R axillo-inguinal anastomosis,then Rt UE as follows: lateral R brachium, medial to lateral R brachium, lateral R brachium followed by re-working anastomosis 5x, all surfaces of antebrachium and dorsum of the hand. Re-worked all surfaces redirecting to anastomosis    Compression Bandaging  thin stockinette, artiflex x1 to the hand and up the arm (with 1/2" gray foam to upper lateral arm) , 1 6cm hand bandage, 1 8cm, and 2 10cm bandages from wrist to axilla.             PT Education - 07/20/19 1038    Education Details  Pt was measured today for compression garment. She was provided with the strength ABC program packet and will try to go through those exercisse at home and ask if she has any questions.    [place](s) Educated  Patient    Methods  Explanation    Comprehension  Verbalized understanding       PT Short Term Goals - 07/02/19 1731      PT SHORT TERM GOAL #1   Title  Pt will be independent with HEP and MLD within 2 weeks in order to demonstrate autonomy of care.    Baseline  pt currently is not performing MLD and does not have an HEP    Time  4    Period  Weeks    Status  New    Target Date  07/23/19        PT Long Term Goals - 07/02/19 1731      PT LONG TERM GOAL #1   Title  Pt will demonstrate 4/5 strength for R shoulder external rotation and flexion to demonstrate improved functional strength.    Baseline  3+/5 R shoulder external rotation and flexion    Time  4    Period  Weeks    Status  New    Target Date  08/06/19      PT LONG TERM GOAL #2   Title  Pt will demonstrate 120 degrees Flexion and abduction in the R shoulder within 4 weeks in order to demonstrate improved functional mobility.    Baseline   R shoulder flexion 105 degrees, R shoulder abduction 112 degrees.    Time  4    Period  Weeks    Status  New    Target Date  08/06/19      PT LONG TERM GOAL #3   Title  Patient will have reduction of R brachium limb girth by 3 cm within 4 weeks to demonstrate decreased fluid.    Baseline  see measurements.    Time  4    Period  Weeks    Status  New    Target Date  08/06/19      PT LONG TERM GOAL #4   Title  Patient to be properly fitted with compression garment to wear on daily basis.    Baseline  Pt has a garment that does not fit adequately on her arm and does not have a garment for her trunk.    Time  4    Period  Weeks    Status  New    Target Date  08/06/19      PT LONG TERM GOAL #5   Title  Pt will report 50% improvement in pain and functional mobiltiy of her RUE within 4 weeks in order to demonstrate improve quality of life.    Baseline  7/10 pain    Time  4    Period  Weeks    Status  New    Target Date  08/06/19            Plan - 07/20/19 1008    Clinical Impression Statement  Continud with MLD and compression wraping this session for the RUE. Re-applied gray foam to the R brachium since this is where she continues with the most fluid. This area has softened and is less fibrotic today. No visible increase in swelling in the digits on the R hand following pt wrap last session w/o finger wrapping. Pt was seen by garment fitter this session and measured during her session before wrapping; discussed options due to insurance will most likely not cover garments. Pt was provided with strength ABC program but due to time constraints was unable to begin this session. Pt will benefit from continued POC at this time.    Personal Factors and Comorbidities  Comorbidity 1;Time since onset of injury/illness/exacerbation    Comorbidities  R modified radical masectomy with 22 lymph node removal    Examination-Activity Limitations  Reach Overhead    Rehab Potential  Good    PT  Frequency  3x / week    PT Duration  4 weeks    PT Treatment/Interventions  Therapeutic activities;Therapeutic exercise;Patient/family education;Manual techniques;Neuromuscular re-education    PT Next Visit Plan  strength ABC program, cont CDT of the Rt UE with bandage changes as necessary, easy shoulder  ROM activities AA/ROM scapular strengthening/stabilizing.    Consulted and Agree with Plan of Care  Patient       Patient will benefit from skilled therapeutic intervention in order to improve the following deficits and impairments:  Decreased strength, Decreased range of motion, Increased edema, Pain  Visit Diagnosis: Postmastectomy lymphedema  Chronic right shoulder pain  Stiffness of right shoulder, not elsewhere classified  Abnormal posture     Problem List Patient Active Problem List   Diagnosis Date Noted  . Breast asymmetry following reconstructive surgery 07/13/2019  . Family history of breast cancer   . Family history of prostate cancer   . Unintentional weight loss 05/21/2019  . Acute pyelonephritis 11/16/2018  . Lymphedema of right arm 02/27/2018  . Dyspnea 09/23/2016  . Eczema 08/31/2016  . Essential hypertension 11/05/2015  . Cervical dystonia 08/25/2015  . Headache 07/09/2015  . Myofascial muscle pain 04/29/2015  .  Adhesive capsulitis of right shoulder 04/29/2015  . Lower extremity edema 10/01/2014  . Malignant neoplasm of overlapping sites of right breast in female, estrogen receptor positive ([place]) 05/24/2014  . Myalgia and myositis 05/17/2014  . Neoplasm related pain 03/12/2014  . CN (constipation) 11/26/2013  . H/O reduction mammoplasty 10/30/2013  . Seizure ([place]) 10/06/2013  . Status post bilateral breast implants 08/31/2013  . S/P breast reconstruction, right 08/23/2013  . AC joint arthropathy 07/02/2013  . Routine adult health maintenance 05/29/2013  . Hypokalemia 05/21/2013  . Chronic pain 04/19/2013  . Acquired absence of breast and absent  nipple 01/30/2013  . RHINOSINUSITIS, CHRONIC 04/08/2010  . DEPRESSIVE DISORDER, NOS 08/04/2006    Ander Purpura, PT 07/20/2019, 11:01 AM  [place], [place], [address] Phone: [phone]   Fax:  [phone]  Name: Khaniya Kust Cespedes MRN: [id number] Date of Birth: 21-Sep-1975

## 2019-07-25 ENCOUNTER — Ambulatory Visit: Payer: Medicare Other

## 2019-07-25 ENCOUNTER — Inpatient Hospital Stay: Payer: Medicare Other

## 2019-07-25 ENCOUNTER — Other Ambulatory Visit: Payer: Self-pay

## 2019-07-25 DIAGNOSIS — M25511 Pain in right shoulder: Secondary | ICD-10-CM

## 2019-07-25 DIAGNOSIS — I972 Postmastectomy lymphedema syndrome: Secondary | ICD-10-CM

## 2019-07-25 DIAGNOSIS — Z170 Estrogen receptor positive status [ER+]: Secondary | ICD-10-CM

## 2019-07-25 DIAGNOSIS — M25611 Stiffness of right shoulder, not elsewhere classified: Secondary | ICD-10-CM | POA: Diagnosis not present

## 2019-07-25 DIAGNOSIS — Z853 Personal history of malignant neoplasm of breast: Secondary | ICD-10-CM | POA: Diagnosis not present

## 2019-07-25 DIAGNOSIS — G8929 Other chronic pain: Secondary | ICD-10-CM

## 2019-07-25 DIAGNOSIS — R293 Abnormal posture: Secondary | ICD-10-CM | POA: Diagnosis not present

## 2019-07-25 DIAGNOSIS — C50811 Malignant neoplasm of overlapping sites of right female breast: Secondary | ICD-10-CM

## 2019-07-25 LAB — CBC WITH DIFFERENTIAL/PLATELET
Abs Immature Granulocytes: 0.01 10*3/uL (ref 0.00–0.07)
Basophils Absolute: 0 10*3/uL (ref 0.0–0.1)
Basophils Relative: 0 %
Eosinophils Absolute: 0.3 10*3/uL (ref 0.0–0.5)
Eosinophils Relative: 3 %
HCT: 40.6 % (ref 36.0–46.0)
Hemoglobin: 13.3 g/dL (ref 12.0–15.0)
Immature Granulocytes: 0 %
Lymphocytes Relative: 36 %
Lymphs Abs: 2.9 10*3/uL (ref 0.7–4.0)
MCH: 31.7 pg (ref 26.0–34.0)
MCHC: 32.8 g/dL (ref 30.0–36.0)
MCV: 96.7 fL (ref 80.0–100.0)
Monocytes Absolute: 0.4 10*3/uL (ref 0.1–1.0)
Monocytes Relative: 5 %
Neutro Abs: 4.4 10*3/uL (ref 1.7–7.7)
Neutrophils Relative %: 56 %
Platelets: 255 10*3/uL (ref 150–400)
RBC: 4.2 MIL/uL (ref 3.87–5.11)
RDW: 13.5 % (ref 11.5–15.5)
WBC: 8 10*3/uL (ref 4.0–10.5)
nRBC: 0 % (ref 0.0–0.2)

## 2019-07-25 LAB — COMPREHENSIVE METABOLIC PANEL
ALT: 12 U/L (ref 0–44)
AST: 10 U/L — ABNORMAL LOW (ref 15–41)
Albumin: 3.8 g/dL (ref 3.5–5.0)
Alkaline Phosphatase: 68 U/L (ref 38–126)
Anion gap: 8 (ref 5–15)
BUN: 8 mg/dL (ref 6–20)
CO2: 21 mmol/L — ABNORMAL LOW (ref 22–32)
Calcium: 8.6 mg/dL — ABNORMAL LOW (ref 8.9–10.3)
Chloride: 113 mmol/L — ABNORMAL HIGH (ref 98–111)
Creatinine, Ser: 0.67 mg/dL (ref 0.44–1.00)
GFR calc Af Amer: >60 mL/min (ref >60)
GFR calc non Af Amer: >60 mL/min (ref >60)
Glucose, Bld: 86 mg/dL (ref 70–99)
Potassium: 4.1 mmol/L (ref 3.5–5.1)
Sodium: 142 mmol/L (ref 135–145)
Total Bilirubin: 0.5 mg/dL (ref 0.3–1.2)
Total Protein: 7.3 g/dL (ref 6.5–8.1)

## 2019-07-25 LAB — GENETIC SCREENING ORDER

## 2019-07-25 NOTE — Therapy (Signed)
[place], [place], [address] Phone: [phone]   Fax:  [phone]  Physical Therapy Treatment  Patient Details  Name: Karen Hopkins MRN: [id number] Date of Birth: December 30, 1975 Referring Provider (PT): Hale Bogus MD   Encounter Date: 07/25/2019  PT End of Session - 07/25/19 1103    Visit Number  8    Number of Visits  13    Date for PT Re-Evaluation  08/06/19    PT Start Time  1006    PT Stop Time  1102    PT Time Calculation (min)  56 min    Behavior During Therapy  [facility] for tasks assessed/performed       Past Medical History:  Diagnosis Date  . Anxiety   . Arthritis   . Cancer ([place]) 2011   Rt. Br. Ca  . Depression   . Diabetes mellitus without complication (HCC)    has been "borderline"  . Essential hypertension 11/05/2015  . Family history of breast cancer   . Family history of prostate cancer   . History of breast cancer 2011   right  . History of chemotherapy 2011  . History of radiation therapy 2011  . Hypertension    under control with med., has been on med. x 2 yr.  . Lymphedema of arm    right; no BP or puncture to right arm  . Personal history of chemotherapy 09/16/2009   rt breast  . Personal history of radiation therapy 05/2010   rt breast  . Pneumonia   . Seasonal allergies   . Sinus headache     Past Surgical History:  Procedure Laterality Date  . BREAST BIOPSY Right 08/26/2009  . BREAST REDUCTION SURGERY Left 08/23/2013   Procedure: LEFT BREAST REDUCTION  ;  Surgeon: Theodoro Kos, DO;  Location: [place];  Service: Plastics;  Laterality: Left;  . CESAREAN SECTION  07/15/2001; 03/18/2004; 11/21/2008  . lateral orbiotomy Left 11/2015   Dr. Toy Cookey at [facility]  . LATISSIMUS FLAP TO BREAST Right 03/12/2013   Procedure: RIGHT BREAST LATISSIMUS FLAP WITH EXPANDER PLACEMENT;  Surgeon: Theodoro Kos, DO;  Location: [place];  Service: Plastics;  Laterality: Right;   . LIPOSUCTION Bilateral 08/23/2013   Procedure: LIPOSUCTION;  Surgeon: Theodoro Kos, DO;  Location: [place];  Service: Plastics;  Laterality: Bilateral;  . MASTECTOMY Right 2011  . MODIFIED RADICAL MASTECTOMY Right 03/11/2010  . NASAL SEPTUM SURGERY    . ORIF ANKLE FRACTURE Left 07/18/2018   Procedure: OPEN REDUCTION INTERNAL FIXATION (ORIF) LEFT ANKLE FRACTURE WITH  SYNDESMOSIS AND ANKLE ARTHROTOMY;  Surgeon: Erle Crocker, MD;  Location: [place];  Service: Orthopedics;  Laterality: Left;  . PORT-A-CATH REMOVAL Left 12/01/2010  . PORTACATH PLACEMENT Left 09/12/2009  . REDUCTION MAMMAPLASTY Left 08/2013  . REMOVAL OF TISSUE EXPANDER AND PLACEMENT OF IMPLANT Right 08/23/2013   Procedure: REMOVAL RIGHT TISSUE EXPANDER AND PLACEMENT OF IMPLANT TO RIGHT BREAST ;  Surgeon: Theodoro Kos, DO;  Location: [place];  Service: Plastics;  Laterality: Right;  . SUPRA-UMBILICAL HERNIA  123456  . TUBAL LIGATION  11/21/2008    There were no vitals filed for this visit.  Subjective Assessment - 07/25/19 1010    Subjective  My upper arm fullness just feels the same.    Pertinent History  R masectomy with reconstruction and 22 lymph node removal    Patient Stated Goals  I want to get my swelling  under control so I can wear regular shirts.    Currently in Pain?  No/denies                  Outpatient Re[facility]  Lymphedema Life Impact Scale Total Score  63.24 %           [facility] Adult PT Treatment/Exercise - 07/25/19 0001      Exercises   Exercises  Other Exercises    Other Exercises   Strength ABC instruction today: Got through all stretches, core exercises, and chest press having pt return demo of each.      Manual Therapy   Compression Bandaging  thin stockinette, Elastomull to fingers 1-4, artiflex x1 to the hand and up the arm (with 1/2" gray foam to upper lateral arm) ,  1 6cm hand bandage, 1 8cm, and 2 10cm bandages from wrist to axilla with first 10 in herring bone             PT Education - 07/25/19 1103    Education Details  Began instruction ofStrength ABC Program (through chest press)    [place](s) Educated  Patient    Methods  Explanation;Demonstration;Handout    Comprehension  Verbalized understanding;Returned demonstration;Tactile cues required;Need further instruction       PT Short Term Goals - 07/02/19 1731      PT SHORT TERM GOAL #1   Title  Pt will be independent with HEP and MLD within 2 weeks in order to demonstrate autonomy of care.    Baseline  pt currently is not performing MLD and does not have an HEP    Time  4    Period  Weeks    Status  New    Target Date  07/23/19        PT Long Term Goals - 07/02/19 1731      PT LONG TERM GOAL #1   Title  Pt will demonstrate 4/5 strength for R shoulder external rotation and flexion to demonstrate improved functional strength.    Baseline  3+/5 R shoulder external rotation and flexion    Time  4    Period  Weeks    Status  New    Target Date  08/06/19      PT LONG TERM GOAL #2   Title  Pt will demonstrate 120 degrees Flexion and abduction in the R shoulder within 4 weeks in order to demonstrate improved functional mobility.    Baseline  R shoulder flexion 105 degrees, R shoulder abduction 112 degrees.    Time  4    Period  Weeks    Status  New    Target Date  08/06/19      PT LONG TERM GOAL #3   Title  Patient will have reduction of R brachium limb girth by 3 cm within 4 weeks to demonstrate decreased fluid.    Baseline  see measurements.    Time  4    Period  Weeks    Status  New    Target Date  08/06/19      PT LONG TERM GOAL #4   Title  Patient to be properly fitted with compression garment to wear on daily basis.    Baseline  Pt has a garment that does not fit adequately on her arm and does not have a garment for her trunk.    Time  4    Period  Weeks    Status  New    Target Date  08/06/19      PT LONG TERM GOAL #5   Title  Pt will report 50% improvement in pain and functional mobiltiy of her RUE within 4 weeks in order to demonstrate improve quality of life.    Baseline  7/10 pain    Time  4    Period  Weeks    Status  New    Target Date  08/06/19            Plan - 07/25/19 1104    Clinical Impression Statement  Only applied compression bandages to Rt UE today to have tie to begin instruction of Strength ABC Program. Pt did very well with this, requiring demo and tactile cues for each exercise, but able to return very good demo and seemed encourgaed to be learningt his exercise program.    Personal Factors and Comorbidities  Comorbidity 1;Time since onset of injury/illness/exacerbation    Comorbidities  R modified radical masectomy with 22 lymph node removal    Examination-Activity Limitations  Reach Overhead    Stability/Clinical Decision Making  Stable/Uncomplicated    Rehab Potential  Good    PT Frequency  3x / week    PT Duration  4 weeks    PT Treatment/Interventions  Therapeutic activities;Therapeutic exercise;Patient/family education;Manual techniques;Neuromuscular re-education    PT Next Visit Plan  Complete instruction of strength ABC program (after chest press), cont CDT of the Rt UE with bandage changes as necessary, easy shoulder  ROM activities AA/ROM scapular strengthening/stabilizing.    Consulted and Agree with Plan of Care  Patient       Patient will benefit from skilled therapeutic intervention in order to improve the following deficits and impairments:  Decreased strength, Decreased range of motion, Increased edema, Pain  Visit Diagnosis: Postmastectomy lymphedema  Chronic right shoulder pain  Stiffness of right shoulder, not elsewhere classified  Abnormal posture     Problem List Patient Active Problem List   Diagnosis Date Noted  . Breast asymmetry following reconstructive surgery 07/13/2019  . Family  history of breast cancer   . Family history of prostate cancer   . Unintentional weight loss 05/21/2019  . Acute pyelonephritis 11/16/2018  . Lymphedema of right arm 02/27/2018  . Dyspnea 09/23/2016  . Eczema 08/31/2016  . Essential hypertension 11/05/2015  . Cervical dystonia 08/25/2015  . Headache 07/09/2015  . Myofascial muscle pain 04/29/2015  . Adhesive capsulitis of right shoulder 04/29/2015  . Lower extremity edema 10/01/2014  . Malignant neoplasm of overlapping sites of right breast in female, estrogen receptor positive ([place]) 05/24/2014  . Myalgia and myositis 05/17/2014  . Neoplasm related pain 03/12/2014  . CN (constipation) 11/26/2013  . H/O reduction mammoplasty 10/30/2013  . Seizure ([place]) 10/06/2013  . Status post bilateral breast implants 08/31/2013  . S/P breast reconstruction, right 08/23/2013  . AC joint arthropathy 07/02/2013  . Routine adult health maintenance 05/29/2013  . Hypokalemia 05/21/2013  . Chronic pain 04/19/2013  . Acquired absence of breast and absent nipple 01/30/2013  . RHINOSINUSITIS, CHRONIC 04/08/2010  . DEPRESSIVE DISORDER, NOS 08/04/2006    Otelia Limes, PTA 07/25/2019, 11:06 AM  [place], [place], [address] Phone: [phone]   Fax:  [phone]  Name: Karen Hopkins MRN: [id number] Date of Birth: 19-Nov-1975

## 2019-07-27 ENCOUNTER — Encounter: Payer: Medicare Other | Admitting: Rehabilitation

## 2019-08-01 ENCOUNTER — Other Ambulatory Visit: Payer: Self-pay

## 2019-08-01 ENCOUNTER — Ambulatory Visit: Payer: Medicare Other

## 2019-08-01 DIAGNOSIS — M25611 Stiffness of right shoulder, not elsewhere classified: Secondary | ICD-10-CM

## 2019-08-01 DIAGNOSIS — I972 Postmastectomy lymphedema syndrome: Secondary | ICD-10-CM

## 2019-08-01 DIAGNOSIS — R293 Abnormal posture: Secondary | ICD-10-CM | POA: Diagnosis not present

## 2019-08-01 DIAGNOSIS — M25511 Pain in right shoulder: Secondary | ICD-10-CM

## 2019-08-01 DIAGNOSIS — G8929 Other chronic pain: Secondary | ICD-10-CM

## 2019-08-01 NOTE — Therapy (Signed)
[place], [place], [address] Phone: [phone]   Fax:  [phone]  Physical Therapy Treatment  Patient Details  Name: Karen Hopkins MRN: [id number] Date of Birth: 11-Jun-1975 Referring Provider (PT): Hale Bogus MD   Encounter Date: 08/01/2019  PT End of Session - 08/01/19 1236    Visit Number  9    Number of Visits  13    Date for PT Re-Evaluation  08/06/19    PT Start Time  1006    PT Stop Time  1103    PT Time Calculation (min)  57 min    Activity Tolerance  Patient tolerated treatment well    Behavior During Therapy  [facility] for tasks assessed/performed       Past Medical History:  Diagnosis Date  . Anxiety   . Arthritis   . Cancer ([place]) 2011   Rt. Br. Ca  . Depression   . Diabetes mellitus without complication (HCC)    has been "borderline"  . Essential hypertension 11/05/2015  . Family history of breast cancer   . Family history of prostate cancer   . History of breast cancer 2011   right  . History of chemotherapy 2011  . History of radiation therapy 2011  . Hypertension    under control with med., has been on med. x 2 yr.  . Lymphedema of arm    right; no BP or puncture to right arm  . Personal history of chemotherapy 09/16/2009   rt breast  . Personal history of radiation therapy 05/2010   rt breast  . Pneumonia   . Seasonal allergies   . Sinus headache     Past Surgical History:  Procedure Laterality Date  . BREAST BIOPSY Right 08/26/2009  . BREAST REDUCTION SURGERY Left 08/23/2013   Procedure: LEFT BREAST REDUCTION  ;  Surgeon: Theodoro Kos, DO;  Location: [place];  Service: Plastics;  Laterality: Left;  . CESAREAN SECTION  07/15/2001; 03/18/2004; 11/21/2008  . lateral orbiotomy Left 11/2015   Dr. Toy Cookey at [facility]  . LATISSIMUS FLAP TO BREAST Right 03/12/2013   Procedure: RIGHT BREAST LATISSIMUS FLAP WITH EXPANDER PLACEMENT;  Surgeon: Theodoro Kos, DO;   Location: [place];  Service: Plastics;  Laterality: Right;  . LIPOSUCTION Bilateral 08/23/2013   Procedure: LIPOSUCTION;  Surgeon: Theodoro Kos, DO;  Location: [place];  Service: Plastics;  Laterality: Bilateral;  . MASTECTOMY Right 2011  . MODIFIED RADICAL MASTECTOMY Right 03/11/2010  . NASAL SEPTUM SURGERY    . ORIF ANKLE FRACTURE Left 07/18/2018   Procedure: OPEN REDUCTION INTERNAL FIXATION (ORIF) LEFT ANKLE FRACTURE WITH  SYNDESMOSIS AND ANKLE ARTHROTOMY;  Surgeon: Erle Crocker, MD;  Location: [place];  Service: Orthopedics;  Laterality: Left;  . PORT-A-CATH REMOVAL Left 12/01/2010  . PORTACATH PLACEMENT Left 09/12/2009  . REDUCTION MAMMAPLASTY Left 08/2013  . REMOVAL OF TISSUE EXPANDER AND PLACEMENT OF IMPLANT Right 08/23/2013   Procedure: REMOVAL RIGHT TISSUE EXPANDER AND PLACEMENT OF IMPLANT TO RIGHT BREAST ;  Surgeon: Theodoro Kos, DO;  Location: [place];  Service: Plastics;  Laterality: Right;  . SUPRA-UMBILICAL HERNIA  123456  . TUBAL LIGATION  11/21/2008    There were no vitals filed for this visit.  Subjective Assessment - 08/01/19 1017    Subjective  I've been doing some of the exercises from the packet you gave me.    Pertinent History  R masectomy with reconstruction and 22 lymph  node removal    Patient Stated Goals  I want to get my swelling under control so I can wear regular shirts.    Currently in Pain?  Yes    Pain Score  5     Pain Location  Shoulder    Pain Orientation  Right    Pain Descriptors / Indicators  Aching    Pain Type  Chronic pain    Pain Onset  More than a month ago    Pain Frequency  Intermittent    Aggravating Factors   reaching    Pain Relieving Factors  stretching            LYMPHEDEMA/ONCOLOGY QUESTIONNAIRE - 08/01/19 1047      Right Upper Extremity Lymphedema   15 cm Proximal to Olecranon Process  39.8 cm    10 cm Proximal to Olecranon Process  40.8 cm    Olecranon Process  27.4  cm    15 cm Proximal to Ulnar Styloid Process  25.4 cm    10 cm Proximal to Ulnar Styloid Process  21.9 cm    Just Proximal to Ulnar Styloid Process  15.8 cm    Across Hand at [employer]  19.4 cm    At [place] of 2nd Digit  5.9 cm           [facility]  Lymphedema Life Impact Scale Total Score  63.24 %           [facility] Adult PT Treatment/Exercise - 08/01/19 0001      Exercises   Other Exercises   Completed instruction of Strength ABC Program beginning with squats from packet. Pt did very well with returning all demos and was happy to learn this program.       Manual Therapy   Compression Bandaging  Thin Stockinette and then applied Juzo Compression Wrap that therapist donated to pt from lymphedema course. Pt was able to return demo of proper donning and readjusting the velcro straps as she will need to do throughout the day. then instructed while having her apply a 6 cm short stretch compression bandage to her hand.               PT Short Term Goals - 07/02/19 1731      PT SHORT TERM GOAL #1   Title  Pt will be independent with HEP and MLD within 2 weeks in order to demonstrate autonomy of care.    Baseline  pt currently is not performing MLD and does not have an HEP    Time  4    Period  Weeks    Status  New    Target Date  07/23/19        PT Long Term Goals - 07/02/19 1731      PT LONG TERM GOAL #1   Title  Pt will demonstrate 4/5 strength for R shoulder external rotation and flexion to demonstrate improved functional strength.    Baseline  3+/5 R shoulder external rotation and flexion    Time  4    Period  Weeks    Status  New    Target Date  08/06/19      PT LONG TERM GOAL #2   Title  Pt will demonstrate 120 degrees Flexion and abduction in the R shoulder within 4 weeks in order to demonstrate improved functional mobility.    Baseline  R shoulder flexion 105 degrees, R shoulder  abduction 112 degrees.    Time  4    Period  Weeks    Status  New    Target Date  08/06/19      PT LONG TERM GOAL #3   Title  Patient will have reduction of R brachium limb girth by 3 cm within 4 weeks to demonstrate decreased fluid.    Baseline  see measurements.    Time  4    Period  Weeks    Status  New    Target Date  08/06/19      PT LONG TERM GOAL #4   Title  Patient to be properly fitted with compression garment to wear on daily basis.    Baseline  Pt has a garment that does not fit adequately on her arm and does not have a garment for her trunk.    Time  4    Period  Weeks    Status  New    Target Date  08/06/19      PT LONG TERM GOAL #5   Title  Pt will report 50% improvement in pain and functional mobiltiy of her RUE within 4 weeks in order to demonstrate improve quality of life.    Baseline  7/10 pain    Time  4    Period  Weeks    Status  New    Target Date  08/06/19            Plan - 08/01/19 1237    Clinical Impression Statement  Completed instruction of Strength ABC Program today which pt did very well being engaged throughout and returning good demo of each exercise with good technique. Remeasured her circumference which was slightly elevated at some points but it has been about a week since pt has been in bandages. Therapist had a Juzo compression wrap donated from lymphedema course last week so gave this to pt today. This was a good fit except for upper most arm was slightly large for bandage, however, pt was instructed to "hold" tissue in or have her son help her with this when donning and then garment fitbetter here. Pt was able to return good demo and reports this feeling comfortable. This can double as a nighttime garment for pt once her new compression sleeve and glove arrive. Pt seemed encouraged by this and should be able to be more compliant with this than with bandages as she can don and doff this independently (unlike bandages) so encouraged her to  wear 23/7 as much as possible only taking off for shower and to use compression pump. She verbalized understanding all. She did fairly well with instruction of hand bandage as velcro garment does not cover hand so will require more instruction with this.    Personal Factors and Comorbidities  Comorbidity 1;Time since onset of injury/illness/exacerbation    Comorbidities  R modified radical masectomy with 22 lymph node removal    Examination-Activity Limitations  Reach Overhead    Stability/Clinical Decision Making  Stable/Uncomplicated    Rehab Potential  Good    PT Frequency  3x / week    PT Duration  4 weeks    PT Treatment/Interventions  Therapeutic activities;Therapeutic exercise;Patient/family education;Manual techniques;Neuromuscular re-education    PT Next Visit Plan  Any questions from Strength ABC Program? Remeasure circumference of Rt UE to assess effectiveness of Juzo velcro wrap, and further instruct pt in hand bandage. Cont MLD and easy Rt shoulder A/AA/ROM activities and scapular strenght/stabilizing exs.  Consulted and Agree with Plan of Care  Patient       Patient will benefit from skilled therapeutic intervention in order to improve the following deficits and impairments:  Decreased strength, Decreased range of motion, Increased edema, Pain  Visit Diagnosis: Postmastectomy lymphedema  Chronic right shoulder pain  Stiffness of right shoulder, not elsewhere classified  Abnormal posture     Problem List Patient Active Problem List   Diagnosis Date Noted  . Breast asymmetry following reconstructive surgery 07/13/2019  . Family history of breast cancer   . Family history of prostate cancer   . Unintentional weight loss 05/21/2019  . Acute pyelonephritis 11/16/2018  . Lymphedema of right arm 02/27/2018  . Dyspnea 09/23/2016  . Eczema 08/31/2016  . Essential hypertension 11/05/2015  . Cervical dystonia 08/25/2015  . Headache 07/09/2015  . Myofascial muscle pain  04/29/2015  . Adhesive capsulitis of right shoulder 04/29/2015  . Lower extremity edema 10/01/2014  . Malignant neoplasm of overlapping sites of right breast in female, estrogen receptor positive ([place]) 05/24/2014  . Myalgia and myositis 05/17/2014  . Neoplasm related pain 03/12/2014  . CN (constipation) 11/26/2013  . H/O reduction mammoplasty 10/30/2013  . Seizure ([place]) 10/06/2013  . Status post bilateral breast implants 08/31/2013  . S/P breast reconstruction, right 08/23/2013  . AC joint arthropathy 07/02/2013  . Routine adult health maintenance 05/29/2013  . Hypokalemia 05/21/2013  . Chronic pain 04/19/2013  . Acquired absence of breast and absent nipple 01/30/2013  . RHINOSINUSITIS, CHRONIC 04/08/2010  . DEPRESSIVE DISORDER, NOS 08/04/2006    Otelia Limes, PTA 08/01/2019, 12:48 PM  [place], [place], [address] Phone: [phone]   Fax:  [phone]  Name: Khole [place] MRN: [id number] Date of Birth: [birth year]

## 2019-08-03 ENCOUNTER — Other Ambulatory Visit: Payer: Self-pay

## 2019-08-03 ENCOUNTER — Ambulatory Visit: Payer: Medicare Other

## 2019-08-03 DIAGNOSIS — R293 Abnormal posture: Secondary | ICD-10-CM

## 2019-08-03 DIAGNOSIS — G8929 Other chronic pain: Secondary | ICD-10-CM | POA: Diagnosis not present

## 2019-08-03 DIAGNOSIS — M25611 Stiffness of right shoulder, not elsewhere classified: Secondary | ICD-10-CM | POA: Diagnosis not present

## 2019-08-03 DIAGNOSIS — M25511 Pain in right shoulder: Secondary | ICD-10-CM | POA: Diagnosis not present

## 2019-08-03 DIAGNOSIS — I972 Postmastectomy lymphedema syndrome: Secondary | ICD-10-CM

## 2019-08-03 NOTE — Therapy (Signed)
[place], [place], [address] Phone: [phone]   Fax:  [phone]  Physical Therapy Treatment  Patient Details  Name: Karen Hopkins MRN: [id number] Date of Birth: 01/12/1976 Referring Provider (PT): Hale Bogus MD   Encounter Date: 08/03/2019  PT End of Session - 08/03/19 1112    Visit Number  10    Number of Visits  13    Date for PT Re-Evaluation  08/06/19    PT Start Time  1110    PT Stop Time  1150    PT Time Calculation (min)  40 min    Activity Tolerance  Patient tolerated treatment well    Behavior During Therapy  [facility] for tasks assessed/performed       Past Medical History:  Diagnosis Date  . Anxiety   . Arthritis   . Cancer ([place]) 2011   Rt. Br. Ca  . Depression   . Diabetes mellitus without complication (HCC)    has been "borderline"  . Essential hypertension 11/05/2015  . Family history of breast cancer   . Family history of prostate cancer   . History of breast cancer 2011   right  . History of chemotherapy 2011  . History of radiation therapy 2011  . Hypertension    under control with med., has been on med. x 2 yr.  . Lymphedema of arm    right; no BP or puncture to right arm  . Personal history of chemotherapy 09/16/2009   rt breast  . Personal history of radiation therapy 05/2010   rt breast  . Pneumonia   . Seasonal allergies   . Sinus headache     Past Surgical History:  Procedure Laterality Date  . BREAST BIOPSY Right 08/26/2009  . BREAST REDUCTION SURGERY Left 08/23/2013   Procedure: LEFT BREAST REDUCTION  ;  Surgeon: Theodoro Kos, DO;  Location: [place];  Service: Plastics;  Laterality: Left;  . CESAREAN SECTION  07/15/2001; 03/18/2004; 11/21/2008  . lateral orbiotomy Left 11/2015   Dr[facility] Toy Cookey at [facility]  . LATISSIMUS FLAP TO BREAST Right 03/12/2013   Procedure: RIGHT BREAST LATISSIMUS FLAP WITH EXPANDER PLACEMENT;  Surgeon: Theodoro Kos, DO;   Location: [place];  Service: Plastics;  Laterality: Right;  . LIPOSUCTION Bilateral 08/23/2013   Procedure: LIPOSUCTION;  Surgeon: Theodoro Kos, DO;  Location: [place];  Service: Plastics;  Laterality: Bilateral;  . MASTECTOMY Right 2011  . MODIFIED RADICAL MASTECTOMY Right 03/11/2010  . NASAL SEPTUM SURGERY    . ORIF ANKLE FRACTURE Left 07/18/2018   Procedure: OPEN REDUCTION INTERNAL FIXATION (ORIF) LEFT ANKLE FRACTURE WITH  SYNDESMOSIS AND ANKLE ARTHROTOMY;  Surgeon: Erle Crocker, MD;  Location: [place];  Service: Orthopedics;  Laterality: Left;  . PORT-A-CATH REMOVAL Left 12/01/2010  . PORTACATH PLACEMENT Left 09/12/2009  . REDUCTION MAMMAPLASTY Left 08/2013  . REMOVAL OF TISSUE EXPANDER AND PLACEMENT OF IMPLANT Right 08/23/2013   Procedure: REMOVAL RIGHT TISSUE EXPANDER AND PLACEMENT OF IMPLANT TO RIGHT BREAST ;  Surgeon: Theodoro Kos, DO;  Location: [place];  Service: Plastics;  Laterality: Right;  . SUPRA-UMBILICAL HERNIA  123456  . TUBAL LIGATION  11/21/2008    There were no vitals filed for this visit.  Subjective Assessment - 08/03/19 1113    Subjective  Pt reports she has no questions about ABC. She states that she has been wearing compression wrap but it slides alot.    Pertinent History  R masectomy with reconstruction and 22 lymph node removal    Currently in Pain?  Yes    Pain Score  6     Pain Location  Shoulder    Pain Orientation  Right    Pain Descriptors / Indicators  Aching    Pain Type  Chronic pain    Pain Radiating Towards  R shoulder blade    Pain Onset  More than a month ago    Pain Frequency  Constant            LYMPHEDEMA/ONCOLOGY QUESTIONNAIRE - 08/03/19 1122      Right Upper Extremity Lymphedema   15 cm Proximal to Olecranon Process  39 cm    10 cm Proximal to Olecranon Process  38.5 cm    Olecranon Process  25.6 cm    15 cm Proximal to Ulnar Styloid Process  25.4 cm    10 cm Proximal to  Ulnar Styloid Process  20.1 cm    Just Proximal to Ulnar Styloid Process  15.2 cm    Across Hand at [employer]  19 cm    At [place] of 2nd Digit  6 cm           [facility]patient [facility]  Lymphedema Life Impact Scale Total Score  63.24 %           [facility] Adult PT Treatment/Exercise - 08/03/19 0001      Manual Therapy   Manual Therapy  Manual Lymphatic Drainage (MLD);Edema management    Edema Management  TG soft small size was donned, velcro wrapped was donned; pt did not bring short stretch bandage to practice wrapping her hand but walked physical therapist through steps of wrapping the hand.     Manual Lymphatic Drainage (MLD)  in supine: short neck, superficial and deep abdominals, Bil axillary and Rt inguinal nodes, anterior inter-axillary anastomosis, R axillo-inguinal anastomosis,then Rt UE as follows: lateral R brachium, medial to lateral R brachium, lateral R brachium followed by re-working anastomosis 5x, all surfaces of antebrachium and dorsum of the hand. Re-worked all surfaces redirecting to anastomosis             PT Education - 08/03/19 1128    Education Details  Pt will continue performing strength ABC exercises at home and wearing velcro crompression wrap at the RUEand wrapping her R hand. Pt went over how to wrap her hand with demonstration not using short stretch bandage due to she forgot to bring it to the clinic today.    Ghazi(s) Educated  Patient    Methods  Explanation    Comprehension  Verbalized understanding       PT Short Term Goals - 07/02/19 1731      PT SHORT TERM GOAL #1   Title  Pt will be independent with HEP and MLD within 2 weeks in order to demonstrate autonomy of care.    Baseline  pt currently is not performing MLD and does not have an HEP    Time  4    Period  Weeks    Status  New    Target Date  07/23/19        PT Long Term Goals - 07/02/19 1731      PT LONG TERM  GOAL #1   Title  Pt will demonstrate 4/5 strength for R shoulder external rotation and flexion to demonstrate improved functional strength.    Baseline  3+/5 R shoulder external rotation  and flexion    Time  4    Period  Weeks    Status  New    Target Date  08/06/19      PT LONG TERM GOAL #2   Title  Pt will demonstrate 120 degrees Flexion and abduction in the R shoulder within 4 weeks in order to demonstrate improved functional mobility.    Baseline  R shoulder flexion 105 degrees, R shoulder abduction 112 degrees.    Time  4    Period  Weeks    Status  New    Target Date  08/06/19      PT LONG TERM GOAL #3   Title  Patient will have reduction of R brachium limb girth by 3 cm within 4 weeks to demonstrate decreased fluid.    Baseline  see measurements.    Time  4    Period  Weeks    Status  New    Target Date  08/06/19      PT LONG TERM GOAL #4   Title  Patient to be properly fitted with compression garment to wear on daily basis.    Baseline  Pt has a garment that does not fit adequately on her arm and does not have a garment for her trunk.    Time  4    Period  Weeks    Status  New    Target Date  08/06/19      PT LONG TERM GOAL #5   Title  Pt will report 50% improvement in pain and functional mobiltiy of her RUE within 4 weeks in order to demonstrate improve quality of life.    Baseline  7/10 pain    Time  4    Period  Weeks    Status  New    Target Date  08/06/19            Plan - 08/03/19 1112    Clinical Impression Statement  Assessed size of TG soft and pt was able to fit small. MLD was performed for the RUE full sequence followed by donning of TG soft and then velcro wrap application. Pt walked the physical therapist through how to wrap the hand with demonstration (not using a wrap due to pt did not bring hers today). Pt had good recall of how to don hand bandage. Velcro wrap was able to be donned w/o modification of velcro this session and is demonstrating  decreased fluid in her distal antebrachium and mid brachium from her last session. Pt will benefit from continued POC at this time.    Personal Factors and Comorbidities  Comorbidity 1;Time since onset of injury/illness/exacerbation    Comorbidities  R modified radical masectomy with 22 lymph node removal    Examination-Activity Limitations  Reach Overhead    Rehab Potential  Good    PT Frequency  3x / week    PT Duration  4 weeks    PT Treatment/Interventions  Therapeutic activities;Therapeutic exercise;Patient/family education;Manual techniques;Neuromuscular re-education    PT Next Visit Plan  Proress Note, If pt is still reducing focus on scapular strengthening/stretching/STM, assess hand bandage and pt donning velcro wrap. circumferential measurements. Ask pt if she has tried pump and if it works, Scientist, clinical (histocompatibility and immunogenetics), contact derek if she has any issues.    Consulted and Agree with Plan of Care  Patient       Patient will benefit from skilled therapeutic intervention in order to improve the following deficits and impairments:  Decreased strength, Decreased range of motion, Increased edema, Pain  Visit Diagnosis: Postmastectomy lymphedema  Chronic right shoulder pain  Stiffness of right shoulder, not elsewhere classified  Abnormal posture     Problem List Patient Active Problem List   Diagnosis Date Noted  . Breast asymmetry following reconstructive surgery 07/13/2019  . Family history of breast cancer   . Family history of prostate cancer   . Unintentional weight loss 05/21/2019  . Acute pyelonephritis 11/16/2018  . Lymphedema of right arm 02/27/2018  . Dyspnea 09/23/2016  . Eczema 08/31/2016  . Essential hypertension 11/05/2015  . Cervical dystonia 08/25/2015  . Headache 07/09/2015  . Myofascial muscle pain 04/29/2015  . Adhesive capsulitis of right shoulder 04/29/2015  . Lower extremity edema 10/01/2014  . Malignant neoplasm of overlapping sites of right breast in  female, estrogen receptor positive ([place]) 05/24/2014  . Myalgia and myositis 05/17/2014  . Neoplasm related pain 03/12/2014  . CN (constipation) 11/26/2013  . H/O reduction mammoplasty 10/30/2013  . Seizure ([place]) 10/06/2013  . Status post bilateral breast implants 08/31/2013  . S/P breast reconstruction, right 08/23/2013  . AC joint arthropathy 07/02/2013  . Routine adult health maintenance 05/29/2013  . Hypokalemia 05/21/2013  . Chronic pain 04/19/2013  . Acquired absence of breast and absent nipple 01/30/2013  . RHINOSINUSITIS, CHRONIC 04/08/2010  . DEPRESSIVE DISORDER, NOS 08/04/2006    Ander Purpura, PT 08/03/2019, 11:58 AM  [place], [place], [address] Phone: [phone]   Fax:  [phone]  Name: Karen Hopkins MRN: [id number] Date of Birth: [birth year]

## 2019-08-06 NOTE — Progress Notes (Deleted)
No diagnosis found.    Patient ID: Karen Hopkins, female    DOB: Sep 11, 1975, 44 y.o.   MRN: [id number]   History of Present Illness: Karen Hopkins is a 44 y.o.  female  with a history of breast asymmetry.  She presents for preoperative evaluation for upcoming procedure, left masopexy with liposuction and ***, scheduled for 08/22/19 with Dr. Marla Roe.  Verify surgery: Are we doing OR reconstruction on right nipple? ***  Summary from previous visit: In 2001 patient was diagnosed with a right upper outer quadrant invasive ductal carcinoma.  It was ER positive with a strong amplification of HER-2.  She was treated with neoadjuvant chemotherapy followed by a right modified radical mastectomy followed by radiation (finished in 2012).  She has residual right arm lymphedema that she treats with compression.  In 2014/2015 she underwent reconstruction with a right latissimus muscle flap and implant placement (Mentor smooth round ultra high profile gel 535 cc).  She had a left breast reduction (> 1300 g) for symmetry (inferior pedical technique).  Since then she has lost approximately 80 pounds resulting in significant asymmetry of her breasts. She is 5 feet 4 inches tall and weighs 184 lbs. She is also still interested in NAC reconstruction of the right breast.  Job: ***  PMH Significant for: HTN, DM, lymphedema (right arm), breast cancer, hx raditaion (right breast).  The patient {HAS HAS [id number]} had problems with anesthesia. ***  Past Medical History: Allergies: Allergies  Allergen Reactions  . Penicillins Swelling    FACIAL SWELLING Did it involve swelling of the face/tongue/throat, SOB, or low BP? Yes Did it involve sudden or severe rash/hives, skin peeling, or any reaction on the inside of your mouth or nose? Unknown Did you need to seek medical attention at a hospital or doctor's office? No When did it last happen?More than 10 years If all above answers are "NO", may proceed with  cephalosporin use.   Recardo Evangelist [Pregabalin] Nausea Only    .  Marland Kitchen Meloxicam Nausea Only  . Robaxin [Methocarbamol] Nausea Only    Current Medications:  Current Outpatient Medications:  .  albuterol (PROAIR HFA) 108 (90 Base) MCG/ACT inhaler, Inhale 2 puffs into the lungs every 6 (six) hours as needed for wheezing or shortness of breath., Disp: 18 g, Rfl: 3 .  levonorgestrel (MIRENA) 20 MCG/24HR IUD, 1 each by Intrauterine route once., Disp: , Rfl:  .  potassium chloride (MICRO-K) 10 MEQ CR capsule, TAKE 1 CAPSULE BY MOUTH 2 TIMES DAILY., Disp: 30 capsule, Rfl: 3 .  topiramate (TOPAMAX) 50 MG tablet, TAKE 3 TABLETS BY MOUTH AT BEDTIME, Disp: 90 tablet, Rfl: 0  Past Medical Problems: Past Medical History:  Diagnosis Date  . Anxiety   . Arthritis   . Cancer ([facility]) 2011   Rt. Br. Ca  . Depression   . Diabetes mellitus without complication (HCC)    has been "borderline"  . Essential hypertension 11/05/2015  . Family history of breast cancer   . Family history of prostate cancer   . History of breast cancer 2011   right  . History of chemotherapy 2011  . History of radiation therapy 2011  . Hypertension    under control with med., has been on med. x 2 yr.  . Lymphedema of arm    right; no BP or puncture to right arm  . Personal history of chemotherapy 09/16/2009   rt breast  . Personal history of radiation therapy 05/2010   rt  breast  . Pneumonia   . Seasonal allergies   . Sinus headache     Past Surgical History: Past Surgical History:  Procedure Laterality Date  . BREAST BIOPSY Right 08/26/2009  . BREAST REDUCTION SURGERY Left 08/23/2013   Procedure: LEFT BREAST REDUCTION  ;  Surgeon: Theodoro Kos, DO;  Location: [place];  Service: Plastics;  Laterality: Left;  . CESAREAN SECTION  07/15/2001; 03/18/2004; 11/21/2008  . lateral orbiotomy Left 11/2015   Dr. Toy Cookey at [facility]  . LATISSIMUS FLAP TO BREAST Right 03/12/2013   Procedure: RIGHT BREAST LATISSIMUS FLAP  WITH EXPANDER PLACEMENT;  Surgeon: Theodoro Kos, DO;  Location: [place];  Service: Plastics;  Laterality: Right;  . LIPOSUCTION Bilateral 08/23/2013   Procedure: LIPOSUCTION;  Surgeon: Theodoro Kos, DO;  Location: [place];  Service: Plastics;  Laterality: Bilateral;  . MASTECTOMY Right 2011  . MODIFIED RADICAL MASTECTOMY Right 03/11/2010  . NASAL SEPTUM SURGERY    . ORIF ANKLE FRACTURE Left 07/18/2018   Procedure: OPEN REDUCTION INTERNAL FIXATION (ORIF) LEFT ANKLE FRACTURE WITH  SYNDESMOSIS AND ANKLE ARTHROTOMY;  Surgeon: Erle Crocker, MD;  Location: [place];  Service: Orthopedics;  Laterality: Left;  . PORT-A-CATH REMOVAL Left 12/01/2010  . PORTACATH PLACEMENT Left 09/12/2009  . REDUCTION MAMMAPLASTY Left 08/2013  . REMOVAL OF TISSUE EXPANDER AND PLACEMENT OF IMPLANT Right 08/23/2013   Procedure: REMOVAL RIGHT TISSUE EXPANDER AND PLACEMENT OF IMPLANT TO RIGHT BREAST ;  Surgeon: Theodoro Kos, DO;  Location: [place];  Service: Plastics;  Laterality: Right;  . SUPRA-UMBILICAL HERNIA  [id number]  . TUBAL LIGATION  11/21/2008    Social History: Social History   Socioeconomic History  . Marital status: Single    Spouse name: Not on file  . Number of children: 3  . Years of education: 64  . Highest education level: Not on file  Occupational History  . Occupation: Disability   Tobacco Use  . Smoking status: Former Smoker    Packs/day: 0.00    Years: 0.00    Pack years: 0.00    Types: Cigarettes    Start date: 06/07/2002    Quit date: 05/08/2019    Years since quitting: 0.2  . Smokeless tobacco: Never Used  Substance and Sexual Activity  . Alcohol use: No  . Drug use: No  . Sexual activity: Yes    Birth control/protection: Surgical, I.U.D.    Comment: Mirena IUD inserted 06/04/19  Other Topics Concern  . Not on file  Social History Narrative   Lives with kids   Caffeine use: none    Social Determinants of Health   Financial  Resource Strain:   . Difficulty of Paying Living Expenses: Not on file  Food Insecurity:   . Worried About Charity fundraiser in the Last Year: Not on file  . Ran Out of Food in the Last Year: Not on file  Transportation Needs:   . Lack of Transportation (Medical): Not on file  . Lack of Transportation (Non-Medical): Not on file  Physical Activity:   . Days of Exercise per Week: Not on file  . Minutes of Exercise per Session: Not on file  Stress:   . Feeling of Stress : Not on file  Social Connections:   . Frequency of Communication with Friends and Family: Not on file  . Frequency of Social Gatherings with Friends and Family: Not on file  . Attends Religious Services: Not on file  . Active  Member of Clubs or Organizations: Not on file  . Attends Archivist Meetings: Not on file  . Marital Status: Not on file  Intimate Partner Violence:   . Fear of Current or Ex-Partner: Not on file  . Emotionally Abused: Not on file  . Physically Abused: Not on file  . Sexually Abused: Not on file    Family History: Family History  Problem Relation Age of Onset  . Hypertension Mother   . Breast cancer Mother 66  . Diabetes type II Father   . Prostate cancer Father 17  . Stroke Neg Hx   . Colon polyps Neg Hx   . Esophageal cancer Neg Hx   . Pancreatic cancer Neg Hx   . Stomach cancer Neg Hx   . Rectal cancer Neg Hx     Review of Systems: ROS  Physical Exam: Vital Signs There were no vitals taken for this visit. Physical Exam  Assessment/Plan:  Ms. Mechling scheduled for left mastopexy with liposuction for breast symmetry and *** with Dr. Marla Roe.  Risks, benefits, and alternatives of procedure discussed, questions answered and consent obtained.    Smoking Status: ***; Counseling Given? *** Last Mammogram: ***; Results: *** (ordered...)  Caprini Score: ***; Risk Factors include: ***, BMI *** 25, hx of cancer, and length of planned surgery. Recommendation for  mechanical *** pharmacological prophylaxis during surgery. Encourage early ambulation.   Pictures obtained: 07/13/19  Post-op Rx sent to pharmacy: ***   Electronically signed by: Threasa Heads, PA-C 08/06/2019 5:10 PM

## 2019-08-07 ENCOUNTER — Encounter: Payer: Medicare Other | Admitting: Plastic Surgery

## 2019-08-09 ENCOUNTER — Other Ambulatory Visit: Payer: Self-pay

## 2019-08-09 ENCOUNTER — Encounter: Payer: Self-pay | Admitting: Surgical

## 2019-08-09 ENCOUNTER — Ambulatory Visit (INDEPENDENT_AMBULATORY_CARE_PROVIDER_SITE_OTHER): Payer: Medicare Other | Admitting: Surgical

## 2019-08-09 VITALS — BP 121/79 | HR 75 | Temp 97.7°F | Ht 64.0 in | Wt 193.8 lb

## 2019-08-09 DIAGNOSIS — C50811 Malignant neoplasm of overlapping sites of right female breast: Secondary | ICD-10-CM

## 2019-08-09 DIAGNOSIS — Z9882 Breast implant status: Secondary | ICD-10-CM

## 2019-08-09 DIAGNOSIS — N651 Disproportion of reconstructed breast: Secondary | ICD-10-CM

## 2019-08-09 DIAGNOSIS — Z9889 Other specified postprocedural states: Secondary | ICD-10-CM

## 2019-08-09 DIAGNOSIS — Z170 Estrogen receptor positive status [ER+]: Secondary | ICD-10-CM

## 2019-08-09 MED ORDER — CIPROFLOXACIN HCL 500 MG PO TABS: 500.0000 mg | ORAL_TABLET | Freq: Two times a day (BID) | ORAL | 0 refills | Status: DC

## 2019-08-09 MED ORDER — HYDROCODONE-ACETAMINOPHEN 5-325 MG PO TABS: 1.0000 | ORAL_TABLET | Freq: Four times a day (QID) | ORAL | 0 refills | Status: AC | PRN

## 2019-08-09 MED ORDER — ONDANSETRON HCL 4 MG PO TABS: 4.0000 mg | ORAL_TABLET | Freq: Three times a day (TID) | ORAL | 0 refills | Status: DC | PRN

## 2019-08-09 MED ORDER — IBUPROFEN 600 MG PO TABS: 600.0000 mg | ORAL_TABLET | Freq: Three times a day (TID) | ORAL | 0 refills | Status: DC | PRN

## 2019-08-09 NOTE — H&P (View-Only) (Signed)
   Patient ID: Karen Hopkins, female    DOB: 03/07/1976, 43 y.o.   MRN: [id number]  Chief Complaint  Patient presents with  . Pre-op Exam    for (L) breast mastopexy reduction w/liposuction for breast symmetry     ICD-10-CM   1. Status post bilateral breast implants  Z98.82   2. Malignant neoplasm of overlapping sites of right breast in female, estrogen receptor positive (HCC)  C50.811    Z17.0   3. H/O reduction mammoplasty  Z98.890   4. Breast asymmetry following reconstructive surgery  N65.1     History of Present Illness: Karen Hopkins is a 43 y.o.  female  with a history of right breast cancer in 2001, ER positive, HER-2 with strong amplification.  She underwent neoadjuvant chemotherapy, mastectomy and radiation which she finished in 2012.  She subsequently underwent right latissimus muscle flap with implant placement -Mentor smooth round ultra high profile gel 535 cc.   She subsequently underwent left breast reduction with over 1300 g removed for symmetry using the inferior pedicle technique.  She presents for preoperative evaluation for upcoming procedure, left breast mastopexy reduction with liposuction for breast symmetry, possible reconstruction of right nipple, scheduled for 08/22/2019 with Dr. Claire Dillingham.  The patient has not had problems with anesthesia. No hx of fmhx of dvt/pe. No hx of MI/CVA. No recent colds/illness. Not on blood thinners. No fmhx of pmhx of bleeding/clotting disorders.   She has a history of essential hypertension, seizure, lower extremity edema, postmastectomy lymphedema, chronic right shoulder pain, stiffness of right shoulder.  Last mammogram per EMR review was in 2019.  Patient will need updated mammogram prior to surgery.  She is aware of this.  She has a surgical history of mastectomy on the right, left breast reduction, latissimus flap, ORIF left ankle fracture, hernia repair (supraumbilical), tubal ligation  Past Medical  History: Allergies: Allergies  Allergen Reactions  . Penicillins Swelling    FACIAL SWELLING Did it involve swelling of the face/tongue/throat, SOB, or low BP? Yes Did it involve sudden or severe rash/hives, skin peeling, or any reaction on the inside of your mouth or nose? Unknown Did you need to seek medical attention at a hospital or doctor's office? No When did it last happen?More than 10 years If all above answers are "NO", may proceed with cephalosporin use.   . Lyrica [Pregabalin] Nausea Only    .  . Meloxicam Nausea Only  . Robaxin [Methocarbamol] Nausea Only    Current Medications:  Current Outpatient Medications:  .  albuterol (PROAIR HFA) 108 (90 Base) MCG/ACT inhaler, Inhale 2 puffs into the lungs every 6 (six) hours as needed for wheezing or shortness of breath., Disp: 18 g, Rfl: 3 .  levonorgestrel (MIRENA) 20 MCG/24HR IUD, 1 each by Intrauterine route once., Disp: , Rfl:  .  potassium chloride (MICRO-K) 10 MEQ CR capsule, TAKE 1 CAPSULE BY MOUTH 2 TIMES DAILY., Disp: 30 capsule, Rfl: 3 .  topiramate (TOPAMAX) 50 MG tablet, TAKE 3 TABLETS BY MOUTH AT BEDTIME, Disp: 90 tablet, Rfl: 0 .  ciprofloxacin (CIPRO) 500 MG tablet, Take 1 tablet (500 mg total) by mouth 2 (two) times daily., Disp: 6 tablet, Rfl: 0 .  HYDROcodone-acetaminophen (NORCO) 5-325 MG tablet, Take 1 tablet by mouth every 6 (six) hours as needed for up to 5 days for severe pain., Disp: 20 tablet, Rfl: 0 .  ondansetron (ZOFRAN) 4 MG tablet, Take 1 tablet (4 mg total) by mouth every 8 (  eight) hours as needed for nausea or vomiting., Disp: 20 tablet, Rfl: 0  Past Medical Problems: Past Medical History:  Diagnosis Date  . Anxiety   . Arthritis   . Cancer (HCC) 2011   Rt. Br. Ca  . Depression   . Diabetes mellitus without complication (HCC)    has been "borderline"  . Essential hypertension 11/05/2015  . Family history of breast cancer   . Family history of prostate cancer   . History of breast cancer  2011   right  . History of chemotherapy 2011  . History of radiation therapy 2011  . Hypertension    under control with med., has been on med. x 2 yr.  . Lymphedema of arm    right; no BP or puncture to right arm  . Personal history of chemotherapy 09/16/2009   rt breast  . Personal history of radiation therapy 05/2010   rt breast  . Pneumonia   . Seasonal allergies   . Sinus headache     Past Surgical History: Past Surgical History:  Procedure Laterality Date  . BREAST BIOPSY Right 08/26/2009  . BREAST REDUCTION SURGERY Left 08/23/2013   Procedure: LEFT BREAST REDUCTION  ;  Surgeon: Claire Sanger, DO;  Location: [facility];  Service: Plastics;  Laterality: Left;  . CESAREAN SECTION  07/15/2001; 03/18/2004; 11/21/2008  . lateral orbiotomy Left 11/2015   Dr. Fuller at [facility]  . LATISSIMUS FLAP TO BREAST Right 03/12/2013   Procedure: RIGHT BREAST LATISSIMUS FLAP WITH EXPANDER PLACEMENT;  Surgeon: Claire Sanger, DO;  Location[facility];  Service: Plastics;  Laterality: Right;  . LIPOSUCTION Bilateral 08/23/2013   Procedure: LIPOSUCTION;  Surgeon: Claire Sanger, DO;  Location: [facility];  Service: Plastics;  Laterality: Bilateral;  . MASTECTOMY Right 2011  . MODIFIED RADICAL MASTECTOMY Right 03/11/2010  . NASAL SEPTUM SURGERY    . ORIF ANKLE FRACTURE Left 07/18/2018   Procedure: OPEN REDUCTION INTERNAL FIXATION (ORIF) LEFT ANKLE FRACTURE WITH  SYNDESMOSIS AND ANKLE ARTHROTOMY;  Surgeon: Adair, Christopher R, MD;  Location: [facility];  Service: Orthopedics;  Laterality: Left;  . PORT-A-CATH REMOVAL Left 12/01/2010  . PORTACATH PLACEMENT Left 09/12/2009  . REDUCTION MAMMAPLASTY Left 08/2013  . REMOVAL OF TISSUE EXPANDER AND PLACEMENT OF IMPLANT Right 08/23/2013   Procedure: REMOVAL RIGHT TISSUE EXPANDER AND PLACEMENT OF IMPLANT TO RIGHT BREAST ;  Surgeon: Claire Sanger, DO;  Location: [facility];  Service: Plastics;  Laterality: Right;  .  SUPRA-UMBILICAL HERNIA  2006  . TUBAL LIGATION  11/21/2008    Social History: Social History   Socioeconomic History  . Marital status: Single    Spouse name: Not on file  . Number of children: 3  . Years of education: 16  . Highest education level: Not on file  Occupational History  . Occupation: Disability   Tobacco Use  . Smoking status: Former Smoker    Packs/day: 0.00    Years: 0.00    Pack years: 0.00    Types: Cigarettes    Start date: 06/07/2002    Quit date: 05/08/2019    Years since quitting: 0.2  . Smokeless tobacco: Never Used  Substance and Sexual Activity  . Alcohol use: No  . Drug use: No  . Sexual activity: Yes    Birth control/protection: Surgical, I.U.D.    Comment: Mirena IUD inserted 06/04/19  Other Topics Concern  . Not on file  Social History Narrative   Lives with kids     Caffeine use: none    Social Determinants of Health   Financial Resource Strain:   . Difficulty of Paying Living Expenses: Not on file  Food Insecurity:   . Worried About Running Out of Food in the Last Year: Not on file  . Ran Out of Food in the Last Year: Not on file  Transportation Needs:   . Lack of Transportation (Medical): Not on file  . Lack of Transportation (Non-Medical): Not on file  Physical Activity:   . Days of Exercise per Week: Not on file  . Minutes of Exercise per Session: Not on file  Stress:   . Feeling of Stress : Not on file  Social Connections:   . Frequency of Communication with Friends and Family: Not on file  . Frequency of Social Gatherings with Friends and Family: Not on file  . Attends Religious Services: Not on file  . Active Member of Clubs or Organizations: Not on file  . Attends Club or Organization Meetings: Not on file  . Marital Status: Not on file  Intimate Partner Violence:   . Fear of Current or Ex-Partner: Not on file  . Emotionally Abused: Not on file  . Physically Abused: Not on file  . Sexually Abused: Not on file    Family  History: Family History  Problem Relation Age of Onset  . Hypertension Mother   . Breast cancer Mother 69  . Diabetes type II Father   . Prostate cancer Father 69  . Stroke Neg Hx   . Colon polyps Neg Hx   . Esophageal cancer Neg Hx   . Pancreatic cancer Neg Hx   . Stomach cancer Neg Hx   . Rectal cancer Neg Hx     Review of Systems: Review of Systems  Constitutional: Negative.   Respiratory: Negative.   Cardiovascular: Negative.   Gastrointestinal: Negative.     Physical Exam: Vital Signs BP 121/79 (BP Location: Left Arm, Patient Position: Sitting, Cuff Size: Large)   Pulse 75   Temp 97.7 F (36.5 C) (Temporal)   Ht 5' 4" (1.626 m)   Wt 193 lb 12.8 oz (87.9 kg)   SpO2 99%   BMI 33.27 kg/m   Physical Exam Constitutional:      Appearance: Normal appearance.  HENT:     Head: Normocephalic and atraumatic.  Cardiovascular:     Rate and Rhythm: Normal rate and regular rhythm.     Pulses: Normal pulses.     Heart sounds: Normal heart sounds.  Pulmonary:     Effort: Pulmonary effort is normal.     Breath sounds: Normal breath sounds.     Comments: L breast s/p reduction with well healed incisions.  Right breast with latissimus flap. No erythema. Well healed. Good color. Abdominal:     General: Abdomen is flat. Bowel sounds are normal.     Palpations: Abdomen is soft.  Musculoskeletal:        General: Normal range of motion.  Skin:    General: Skin is warm and dry.  Neurological:     General: No focal deficit present.     Mental Status: She is alert and oriented to [place], place, and time.  Psychiatric:        Mood and Affect: Mood normal.        Behavior: Behavior normal.     Assessment/Plan: The patient is scheduled for left breast mastopexy reduction with liposuction with breast symmetry, possible reconstruction of her right nipple with Dr. Dillingham   on 08/22/2019.    We thoroughly covered the risks associated with mastopexy/reduction with liposuction  as well as reconstruction of right nipple.  We also thoroughly covered the general surgical risks.  All of the patient's questions were answered and verbal confirmation obtained that patient was satisfied.  Patient is aware that due to radiation of right breast she has an increased risk of wound healing.  She is also aware that due to her history of left breast reduction she has an increased risk of nipple necrosis and fat necrosis.  We also had a thorough conversation about nipple areolar reconstruction of the right breast.  She would like to move forward with reconstruction of her right nipple in her operatively with tissue rearrangement by Dr. Dillingham on 08/22/2019.  She understands that this is a staged process and she can undergo nipple tattooing after healing.  Bonita Cox, RN was present during this conversation and spoke with the patient about the tattoo procedure.  Mammogram: Scheduling for prior to surgery, patient aware this needs to be completed. Office staff assisting with scheduling. Smoking status: not smoking Caprini score: 6, high, recommend mechanical intraop ppx, early ambulation, possible chemoppx.  Prescription sent to pharmacy.  Recommend calling with any questions or concerns prior to surgery.  Electronically signed by: Vetta Couzens J Magdalen Cabana, PA-C 08/09/2019 10:02 AM 

## 2019-08-09 NOTE — Progress Notes (Signed)
Patient ID: Karen Hopkins, female    DOB: 01-Aug-1975, 44 y.o.   MRN: [id number]  Chief Complaint  Patient presents with  . Pre-op Exam    for (L) breast mastopexy reduction w/liposuction for breast symmetry     ICD-10-CM   1. Status post bilateral breast implants  Z98.82   2. Malignant neoplasm of overlapping sites of right breast in female, estrogen receptor positive ([place])  C50.811    Z17.0   3. H/O reduction mammoplasty  Z98.890   4. Breast asymmetry following reconstructive surgery  N65.1     History of Present Illness: Karen Hopkins is a 44 y.o.  female  with a history of right breast cancer in 2001, ER positive, HER-2 with strong amplification.  She underwent neoadjuvant chemotherapy, mastectomy and radiation which she finished in 2012.  She subsequently underwent right latissimus muscle flap with implant placement -Mentor smooth round ultra high profile gel 535 cc.   She subsequently underwent left breast reduction with over 1300 g removed for symmetry using the inferior pedicle technique.  She presents for preoperative evaluation for upcoming procedure, left breast mastopexy reduction with liposuction for breast symmetry, possible reconstruction of right nipple, scheduled for 08/22/2019 with Dr. Audelia Hives.  The patient has not had problems with anesthesia. No hx of fmhx of dvt/pe. No hx of MI/CVA. No recent colds/illness. Not on blood thinners. No fmhx of pmhx of bleeding/clotting disorders.   She has a history of essential hypertension, seizure, lower extremity edema, postmastectomy lymphedema, chronic right shoulder pain, stiffness of right shoulder.  Last mammogram per EMR review was in 2019.  Patient will need updated mammogram prior to surgery.  She is aware of this.  She has a surgical history of mastectomy on the right, left breast reduction, latissimus flap, ORIF left ankle fracture, hernia repair (supraumbilical), tubal ligation  Past Medical  History: Allergies: Allergies  Allergen Reactions  . Penicillins Swelling    FACIAL SWELLING Did it involve swelling of the face/tongue/throat, SOB, or low BP? Yes Did it involve sudden or severe rash/hives, skin peeling, or any reaction on the inside of your mouth or nose? Unknown Did you need to seek medical attention at a hospital or doctor's office? No When did it last happen?More than 10 years If all above answers are "NO", may proceed with cephalosporin use.   Karen Hopkins [Pregabalin] Nausea Only    .  Karen Hopkins Meloxicam Nausea Only  . Robaxin [Methocarbamol] Nausea Only    Current Medications:  Current Outpatient Medications:  .  albuterol (PROAIR HFA) 108 (90 Base) MCG/ACT inhaler, Inhale 2 puffs into the lungs every 6 (six) hours as needed for wheezing or shortness of breath., Disp: 18 g, Rfl: 3 .  levonorgestrel (MIRENA) 20 MCG/24HR IUD, 1 each by Intrauterine route once., Disp: , Rfl:  .  potassium chloride (MICRO-K) 10 MEQ CR capsule, TAKE 1 CAPSULE BY MOUTH 2 TIMES DAILY., Disp: 30 capsule, Rfl: 3 .  topiramate (TOPAMAX) 50 MG tablet, TAKE 3 TABLETS BY MOUTH AT BEDTIME, Disp: 90 tablet, Rfl: 0 .  ciprofloxacin (CIPRO) 500 MG tablet, Take 1 tablet (500 mg total) by mouth 2 (two) times daily., Disp: 6 tablet, Rfl: 0 .  HYDROcodone-acetaminophen (NORCO) 5-325 MG tablet, Take 1 tablet by mouth every 6 (six) hours as needed for up to 5 days for severe pain., Disp: 20 tablet, Rfl: 0 .  ondansetron (ZOFRAN) 4 MG tablet, Take 1 tablet (4 mg total) by mouth every 8 (  eight) hours as needed for nausea or vomiting., Disp: 20 tablet, Rfl: 0  Past Medical Problems: Past Medical History:  Diagnosis Date  . Anxiety   . Arthritis   . Cancer ([place]) 2011   Rt. Br. Ca  . Depression   . Diabetes mellitus without complication ([facility])    has been "borderline"  . Essential hypertension 11/05/2015  . Family history of breast cancer   . Family history of prostate cancer   . History of breast cancer  2011   right  . History of chemotherapy 2011  . History of radiation therapy 2011  . Hypertension    under control with med., has been on med. x 2 yr.  . Lymphedema of arm    right; no BP or puncture to right arm  . Personal history of chemotherapy 09/16/2009   rt breast  . Personal history of radiation therapy 05/2010   rt breast  . Pneumonia   . Seasonal allergies   . Sinus headache     Past Surgical History: Past Surgical History:  Procedure Laterality Date  . BREAST BIOPSY Right 08/26/2009  . BREAST REDUCTION SURGERY Left 08/23/2013   Procedure: LEFT BREAST REDUCTION  ;  Surgeon: Theodoro Kos, DO;  Location: [facility];  Service: Plastics;  Laterality: Left;  . CESAREAN SECTION  07/15/2001; 03/18/2004; 11/21/2008  . lateral orbiotomy Left 11/2015   [facility] Toy Co[facility]key [facility]  . LATISSIMUS FLAP TO BREAST Right 03/12/2013   Procedure: RIGHT BREAST LATISSIMUS FLAP WITH EXPANDER PLACEMENT;  Surgeon: Theodoro Kos, DO;  Location: [place];  Service: Plastics;  Laterality: Right;  . LIPOSUCTION Bilateral 08/23/2013   Procedure: LIPOSUCTION;  Surgeon: Theodoro Kos, DO;  Location: [place];  Service: Plastics;  Laterality: Bilateral;  . MASTECTOMY Right 2011  . MODIFIED RADICAL MASTECTOMY Right 03/11/2010  . NASAL SEPTUM SURGERY    . ORIF ANKLE FRACTURE Left 07/18/2018   Procedure: OPEN REDUCTION INTERNAL FIXATION (ORIF) LEFT ANKLE FRACTURE WITH  SYNDESMOSIS AND ANKLE ARTHROTOMY;  Surgeon: Erle Crocker, MD;  Location: [place];  Service: Orthopedics;  Laterality: Left;  . PORT-A-CATH REMOVAL Left 12/01/2010  . PORTACATH PLACEMENT Left 09/12/2009  . REDUCTION MAMMAPLASTY Left 08/2013  . REMOVAL OF TISSUE EXPANDER AND PLACEMENT OF IMPLANT Right 08/23/2013   Procedure: REMOVAL RIGHT TISSUE EXPANDER AND PLACEMENT OF IMPLANT TO RIGHT BREAST ;  Surgeon: Theodoro Kos, DO;  Location: [place];  Service: Plastics;  Laterality: Right;  .  SUPRA-UMBILICAL HERNIA  2500  . TUBAL LIGATION  11/21/2008    Social History: Social History   Socioeconomic History  . Marital status: Single    Spouse name: Not on file  . Number of children: 3  . Years of education: 57  . Highest education level: Not on file  Occupational History  . Occupation: Disability   Tobacco Use  . Smoking status: Former Smoker    Packs/day: 0.00    Years: 0.00    Pack years: 0.00    Types: Cigarettes    Start date: 06/07/2002    Quit date: 05/08/2019    Years since quitting: 0.2  . Smokeless tobacco: Never Used  Substance and Sexual Activity  . Alcohol use: No  . Drug use: No  . Sexual activity: Yes    Birth control/protection: Surgical, I.U.D.    Comment: Mirena IUD inserted 06/04/19  Other Topics Concern  . Not on file  Social History Narrative   Lives with kids  Caffeine use: none    Social Determinants of Health   Financial Resource Strain:   . Difficulty of Paying Living Expenses: Not on file  Food Insecurity:   . Worried About Charity fundraiser in the Last Year: Not on file  . Ran Out of Food in the Last Year: Not on file  Transportation Needs:   . Lack of Transportation (Medical): Not on file  . Lack of Transportation (Non-Medical): Not on file  Physical Activity:   . Days of Exercise per Week: Not on file  . Minutes of Exercise per Session: Not on file  Stress:   . Feeling of Stress : Not on file  Social Connections:   . Frequency of Communication with Friends and Family: Not on file  . Frequency of Social Gatherings with Friends and Family: Not on file  . Attends Religious Services: Not on file  . Active Member of Clubs or Organizations: Not on file  . Attends Archivist Meetings: Not on file  . Marital Status: Not on file  Intimate Partner Violence:   . Fear of Current or Ex-Partner: Not on file  . Emotionally Abused: Not on file  . Physically Abused: Not on file  . Sexually Abused: Not on file    Family  History: Family History  Problem Relation Age of Onset  . Hypertension Mother   . Breast cancer Mother 32  . Diabetes type II Father   . Prostate cancer Father 35  . Stroke Neg Hx   . Colon polyps Neg Hx   . Esophageal cancer Neg Hx   . Pancreatic cancer Neg Hx   . Stomach cancer Neg Hx   . Rectal cancer Neg Hx     Review of Systems: Review of Systems  Constitutional: Negative.   Respiratory: Negative.   Cardiovascular: Negative.   Gastrointestinal: Negative.     Physical Exam: Vital Signs BP 121/79 (BP Location: Left Arm, Patient Position: Sitting, Cuff Size: Large)   Pulse 75   Temp 97.7 F (36.5 C) (Temporal)   Ht _0  (1.626 m)   Wt 193 lb 12.8 oz (87.9 kg)   SpO2 99%   BMI 33.27 kg/m   Physical Exam Constitutional:      Appearance: Normal appearance.  HENT:     Head: Normocephalic and atraumatic.  Cardiovascular:     Rate and Rhythm: Normal rate and regular rhythm.     Pulses: Normal pulses.     Heart sounds: Normal heart sounds.  Pulmonary:     Effort: Pulmonary effort is normal.     Breath sounds: Normal breath sounds.     Comments: L breast s/p reduction with well healed incisions.  Right breast with latissimus flap. No erythema. Well healed. Good color. Abdominal:     General: Abdomen is flat. Bowel sounds are normal.     Palpations: Abdomen is soft.  Musculoskeletal:        General: Normal range of motion.  Skin:    General: Skin is warm and dry.  Neurological:     General: No focal deficit present.     Mental Status: She is alert and oriented to [place], place, and time.  Psychiatric:        Mood and Affect: Mood normal.        Behavior: Behavior normal.     Assessment/Plan: The patient is scheduled for left breast mastopexy reduction with liposuction with breast symmetry, possible reconstruction of her right nipple with Dr. Marla Roe  on 08/22/2019.    We thoroughly covered the risks associated with mastopexy/reduction with liposuction  as well as reconstruction of right nipple.  We also thoroughly covered the general surgical risks.  All of the patient's questions were answered and verbal confirmation obtained that patient was satisfied.  Patient is aware that due to radiation of right breast she has an increased risk of wound healing.  She is also aware that due to her history of left breast reduction she has an increased risk of nipple necrosis and fat necrosis.  We also had a thorough conversation about nipple areolar reconstruction of the right breast.  She would like to move forward with reconstruction of her right nipple in her operatively with tissue rearrangement by Dr. Marla Roe on 08/22/2019.  She understands that this is a staged process and she can undergo nipple tattooing after healing.  [place], RN was present during this conversation and spoke with the patient about the tattoo procedure.  Mammogram: Scheduling for prior to surgery, patient aware this needs to be completed. Office staff assisting with scheduling. Smoking status: not smoking Caprini score: 6, high, recommend mechanical intraop ppx, early ambulation, possible chemoppx.  Prescription sent to pharmacy.  Recommend calling with any questions or concerns prior to surgery.  Electronically signed by: Carola Rhine Jaecion Dempster, PA-C 08/09/2019 10:02 AM

## 2019-08-10 ENCOUNTER — Ambulatory Visit: Payer: Medicare Other

## 2019-08-14 ENCOUNTER — Other Ambulatory Visit: Payer: Self-pay

## 2019-08-14 ENCOUNTER — Encounter: Payer: Self-pay | Admitting: Genetic Counselor

## 2019-08-14 ENCOUNTER — Telehealth: Payer: Self-pay | Admitting: Genetic Counselor

## 2019-08-14 ENCOUNTER — Encounter (HOSPITAL_BASED_OUTPATIENT_CLINIC_OR_DEPARTMENT_OTHER): Payer: Self-pay | Admitting: Plastic Surgery

## 2019-08-14 DIAGNOSIS — Z1379 Encounter for other screening for genetic and chromosomal anomalies: Secondary | ICD-10-CM | POA: Insufficient documentation

## 2019-08-14 NOTE — Telephone Encounter (Signed)
Mailbox is full.

## 2019-08-18 ENCOUNTER — Other Ambulatory Visit (HOSPITAL_COMMUNITY)
Admission: RE | Admit: 2019-08-18 | Discharge: 2019-08-18 | Disposition: A | Discharge status: Home / Self Care | Payer: Medicare Other | Source: Ambulatory Visit | Attending: Plastic Surgery | Admitting: Plastic Surgery

## 2019-08-18 DIAGNOSIS — Z20822 Contact with and (suspected) exposure to covid-19: Secondary | ICD-10-CM | POA: Diagnosis not present

## 2019-08-18 DIAGNOSIS — Z01812 Encounter for preprocedural laboratory examination: Secondary | ICD-10-CM | POA: Insufficient documentation

## 2019-08-18 LAB — SARS CORONAVIRUS 2 (TAT 6-24 HRS): SARS Coronavirus 2: NEGATIVE

## 2019-08-20 NOTE — Telephone Encounter (Signed)
LM on VM that results are back and to please call. 

## 2019-08-21 NOTE — Anesthesia Preprocedure Evaluation (Signed)
Anesthesia Evaluation  Patient identified by MRN, date of birth, ID band Patient awake    Reviewed: Allergy & Precautions, NPO status , Patient's Chart, lab work & pertinent test results  History of Anesthesia Complications Negative for: history of anesthetic complications  Airway Mallampati: II  TM Distance: >3 FB Neck ROM: Full    Dental no notable dental hx. (+) Dental Advisory Given   Pulmonary neg pulmonary ROS, former smoker,    Pulmonary exam normal        Cardiovascular hypertension, Normal cardiovascular exam     Neuro/Psych  Headaches, Seizures -,  PSYCHIATRIC DISORDERS Anxiety Depression  Neuromuscular disease    GI/Hepatic negative GI ROS, Neg liver ROS,   Endo/Other  diabetesMorbid obesity  Renal/GU negative Renal ROS     Musculoskeletal negative musculoskeletal ROS (+)   Abdominal (+) + obese,   Peds  Hematology negative hematology ROS (+)   Anesthesia Other Findings Day of surgery medications reviewed with the patient.  Reproductive/Obstetrics                             Anesthesia Physical  Anesthesia Plan  ASA: III  Anesthesia Plan: General   Post-op Pain Management:    Induction: Intravenous  PONV Risk Score and Plan: 3 and Ondansetron, Dexamethasone, Scopolamine patch - Pre-op and Midazolam  Airway Management Planned: LMA and Oral ETT  Additional Equipment:   Intra-op Plan:   Post-operative Plan: Extubation in OR  Informed Consent: I have reviewed the patients History and Physical, chart, labs and discussed the procedure including the risks, benefits and alternatives for the proposed anesthesia with the patient or authorized representative who has indicated his/her understanding and acceptance.     Dental advisory given  Plan Discussed with: CRNA, Anesthesiologist and Surgeon  Anesthesia Plan Comments:         Anesthesia Quick Evaluation

## 2019-08-21 NOTE — Telephone Encounter (Signed)
LM on VM that results are back and to please call.  Left CB instructions. 

## 2019-08-22 ENCOUNTER — Ambulatory Visit (HOSPITAL_BASED_OUTPATIENT_CLINIC_OR_DEPARTMENT_OTHER)
Admission: RE | Admit: 2019-08-22 | Discharge: 2019-08-22 | Disposition: A | Discharge status: Home / Self Care | Payer: Medicare Other | Attending: Plastic Surgery | Admitting: Plastic Surgery

## 2019-08-22 ENCOUNTER — Other Ambulatory Visit: Payer: Self-pay

## 2019-08-22 ENCOUNTER — Ambulatory Visit (HOSPITAL_BASED_OUTPATIENT_CLINIC_OR_DEPARTMENT_OTHER): Payer: Medicare Other | Admitting: Anesthesiology

## 2019-08-22 ENCOUNTER — Encounter (HOSPITAL_BASED_OUTPATIENT_CLINIC_OR_DEPARTMENT_OTHER)
Admission: RE | Disposition: A | Discharge status: Home / Self Care | Payer: Self-pay | Source: Home / Self Care | Attending: Plastic Surgery

## 2019-08-22 ENCOUNTER — Encounter (HOSPITAL_BASED_OUTPATIENT_CLINIC_OR_DEPARTMENT_OTHER): Payer: Self-pay | Admitting: Plastic Surgery

## 2019-08-22 DIAGNOSIS — F329 Major depressive disorder, single episode, unspecified: Secondary | ICD-10-CM | POA: Diagnosis not present

## 2019-08-22 DIAGNOSIS — E876 Hypokalemia: Secondary | ICD-10-CM | POA: Diagnosis not present

## 2019-08-22 DIAGNOSIS — Z923 Personal history of irradiation: Secondary | ICD-10-CM | POA: Diagnosis not present

## 2019-08-22 DIAGNOSIS — Z79899 Other long term (current) drug therapy: Secondary | ICD-10-CM | POA: Diagnosis not present

## 2019-08-22 DIAGNOSIS — C50811 Malignant neoplasm of overlapping sites of right female breast: Secondary | ICD-10-CM

## 2019-08-22 DIAGNOSIS — Z87891 Personal history of nicotine dependence: Secondary | ICD-10-CM | POA: Insufficient documentation

## 2019-08-22 DIAGNOSIS — Z880 Allergy status to penicillin: Secondary | ICD-10-CM | POA: Insufficient documentation

## 2019-08-22 DIAGNOSIS — Z803 Family history of malignant neoplasm of breast: Secondary | ICD-10-CM | POA: Diagnosis not present

## 2019-08-22 DIAGNOSIS — Z853 Personal history of malignant neoplasm of breast: Secondary | ICD-10-CM | POA: Diagnosis not present

## 2019-08-22 DIAGNOSIS — G709 Myoneural disorder, unspecified: Secondary | ICD-10-CM | POA: Diagnosis not present

## 2019-08-22 DIAGNOSIS — Z888 Allergy status to other drugs, medicaments and biological substances status: Secondary | ICD-10-CM | POA: Insufficient documentation

## 2019-08-22 DIAGNOSIS — Z6832 Body mass index (BMI) 32.0-32.9, adult: Secondary | ICD-10-CM | POA: Diagnosis not present

## 2019-08-22 DIAGNOSIS — N6489 Other specified disorders of breast: Secondary | ICD-10-CM | POA: Insufficient documentation

## 2019-08-22 DIAGNOSIS — Z9882 Breast implant status: Secondary | ICD-10-CM

## 2019-08-22 DIAGNOSIS — R569 Unspecified convulsions: Secondary | ICD-10-CM | POA: Insufficient documentation

## 2019-08-22 DIAGNOSIS — N651 Disproportion of reconstructed breast: Secondary | ICD-10-CM

## 2019-08-22 DIAGNOSIS — R519 Headache, unspecified: Secondary | ICD-10-CM | POA: Diagnosis not present

## 2019-08-22 DIAGNOSIS — Z170 Estrogen receptor positive status [ER+]: Secondary | ICD-10-CM

## 2019-08-22 DIAGNOSIS — F419 Anxiety disorder, unspecified: Secondary | ICD-10-CM | POA: Insufficient documentation

## 2019-08-22 DIAGNOSIS — Z8042 Family history of malignant neoplasm of prostate: Secondary | ICD-10-CM | POA: Diagnosis not present

## 2019-08-22 DIAGNOSIS — I10 Essential (primary) hypertension: Secondary | ICD-10-CM | POA: Insufficient documentation

## 2019-08-22 DIAGNOSIS — Z9221 Personal history of antineoplastic chemotherapy: Secondary | ICD-10-CM | POA: Diagnosis not present

## 2019-08-22 DIAGNOSIS — Z9889 Other specified postprocedural states: Secondary | ICD-10-CM

## 2019-08-22 HISTORY — PX: BREAST REDUCTION WITH MASTOPEXY: SHX6465

## 2019-08-22 HISTORY — PX: LIPOSUCTION WITH LIPOFILLING: SHX6436

## 2019-08-22 LAB — POCT PREGNANCY, URINE: Preg Test, Ur: NEGATIVE

## 2019-08-22 SURGERY — MAMMOPLASTY, REDUCTION, WITH MASTOPEXY
Anesthesia: General | Site: Breast | Laterality: Right

## 2019-08-22 MED ORDER — LIDOCAINE HCL (PF) 1 % IJ SOLN
INTRAMUSCULAR | Status: AC
  Filled 2019-08-22: qty 30

## 2019-08-22 MED ORDER — SODIUM CHLORIDE 0.9 % IV SOLN
INTRAVENOUS | Status: AC
  Filled 2019-08-22: qty 500000

## 2019-08-22 MED ORDER — BUPIVACAINE HCL (PF) 0.25 % IJ SOLN
INTRAMUSCULAR | Status: AC
  Filled 2019-08-22: qty 30

## 2019-08-22 MED ORDER — FENTANYL CITRATE (PF) 100 MCG/2ML IJ SOLN
INTRAMUSCULAR | Status: AC
  Filled 2019-08-22: qty 2

## 2019-08-22 MED ORDER — ACETAMINOPHEN 325 MG PO TABS: 325.0000 mg | ORAL_TABLET | ORAL | Status: DC | PRN

## 2019-08-22 MED ORDER — OXYCODONE HCL 5 MG PO TABS: 5.0000 mg | ORAL_TABLET | ORAL | Status: DC | PRN

## 2019-08-22 MED ORDER — DEXMEDETOMIDINE HCL 200 MCG/2ML IV SOLN
INTRAVENOUS | Status: DC | PRN
  Administered 2019-08-22: 20 ug via INTRAVENOUS

## 2019-08-22 MED ORDER — SODIUM CHLORIDE 0.9% FLUSH: 3.0000 mL | Freq: Two times a day (BID) | INTRAVENOUS | Status: DC

## 2019-08-22 MED ORDER — CHLORHEXIDINE GLUCONATE CLOTH 2 % EX PADS: 6.0000 | MEDICATED_PAD | Freq: Once | CUTANEOUS | Status: DC

## 2019-08-22 MED ORDER — ACETAMINOPHEN 325 MG PO TABS: 650.0000 mg | ORAL_TABLET | ORAL | Status: DC | PRN

## 2019-08-22 MED ORDER — SODIUM CHLORIDE 0.9% FLUSH: 3.0000 mL | INTRAVENOUS | Status: DC | PRN

## 2019-08-22 MED ORDER — LIDOCAINE-EPINEPHRINE 1 %-1:100000 IJ SOLN
INTRAMUSCULAR | Status: AC
  Filled 2019-08-22: qty 1

## 2019-08-22 MED ORDER — LACTATED RINGERS IV SOLN: INTRAVENOUS | Status: DC

## 2019-08-22 MED ORDER — EPHEDRINE 5 MG/ML INJ
INTRAVENOUS | Status: AC
  Filled 2019-08-22: qty 10

## 2019-08-22 MED ORDER — DIPHENHYDRAMINE HCL 50 MG/ML IJ SOLN
INTRAMUSCULAR | Status: AC
  Filled 2019-08-22: qty 1

## 2019-08-22 MED ORDER — OXYCODONE HCL 5 MG/5ML PO SOLN: 5.0000 mg | Freq: Once | ORAL | Status: AC | PRN

## 2019-08-22 MED ORDER — FENTANYL CITRATE (PF) 100 MCG/2ML IJ SOLN
25.0000 ug | INTRAMUSCULAR | Status: DC | PRN
  Administered 2019-08-22: 50 ug via INTRAVENOUS

## 2019-08-22 MED ORDER — LIDOCAINE HCL 1 % IJ SOLN
INTRAVENOUS | Status: DC | PRN
  Administered 2019-08-22: 1000 mL

## 2019-08-22 MED ORDER — CIPROFLOXACIN IN D5W 400 MG/200ML IV SOLN
INTRAVENOUS | Status: AC
  Filled 2019-08-22: qty 200

## 2019-08-22 MED ORDER — MEPERIDINE HCL 25 MG/ML IJ SOLN: 6.2500 mg | INTRAMUSCULAR | Status: DC | PRN

## 2019-08-22 MED ORDER — LIDOCAINE 2% (20 MG/ML) 5 ML SYRINGE
INTRAMUSCULAR | Status: AC
  Filled 2019-08-22: qty 5

## 2019-08-22 MED ORDER — EPINEPHRINE PF 1 MG/ML IJ SOLN
INTRAMUSCULAR | Status: AC
  Filled 2019-08-22: qty 1

## 2019-08-22 MED ORDER — PHENYLEPHRINE 40 MCG/ML (10ML) SYRINGE FOR IV PUSH (FOR BLOOD PRESSURE SUPPORT)
PREFILLED_SYRINGE | INTRAVENOUS | Status: AC
  Filled 2019-08-22: qty 10

## 2019-08-22 MED ORDER — ONDANSETRON HCL 4 MG/2ML IJ SOLN
4.0000 mg | Freq: Once | INTRAMUSCULAR | Status: AC | PRN
  Administered 2019-08-22 (×2): 4 mg via INTRAVENOUS

## 2019-08-22 MED ORDER — CIPROFLOXACIN IN D5W 400 MG/200ML IV SOLN
400.0000 mg | INTRAVENOUS | Status: AC
  Administered 2019-08-22: 400 mg via INTRAVENOUS

## 2019-08-22 MED ORDER — OXYCODONE HCL 5 MG PO TABS
5.0000 mg | ORAL_TABLET | Freq: Once | ORAL | Status: AC | PRN
  Administered 2019-08-22: 5 mg via ORAL

## 2019-08-22 MED ORDER — LIDOCAINE HCL (CARDIAC) PF 100 MG/5ML IV SOSY
PREFILLED_SYRINGE | INTRAVENOUS | Status: DC | PRN
  Administered 2019-08-22: 100 mg via INTRAVENOUS

## 2019-08-22 MED ORDER — SODIUM CHLORIDE 0.9 % IV SOLN: 250.0000 mL | INTRAVENOUS | Status: DC | PRN

## 2019-08-22 MED ORDER — ACETAMINOPHEN 160 MG/5ML PO SOLN: 325.0000 mg | ORAL | Status: DC | PRN

## 2019-08-22 MED ORDER — LIDOCAINE-EPINEPHRINE 1 %-1:100000 IJ SOLN
INTRAMUSCULAR | Status: DC | PRN
  Administered 2019-08-22: 17 mL

## 2019-08-22 MED ORDER — ONDANSETRON HCL 4 MG/2ML IJ SOLN
INTRAMUSCULAR | Status: AC
  Filled 2019-08-22: qty 2

## 2019-08-22 MED ORDER — DEXAMETHASONE SODIUM PHOSPHATE 4 MG/ML IJ SOLN
INTRAMUSCULAR | Status: DC | PRN
  Administered 2019-08-22: 5 mg via INTRAVENOUS

## 2019-08-22 MED ORDER — SUCCINYLCHOLINE CHLORIDE 200 MG/10ML IV SOSY
PREFILLED_SYRINGE | INTRAVENOUS | Status: AC
  Filled 2019-08-22: qty 10

## 2019-08-22 MED ORDER — DEXAMETHASONE SODIUM PHOSPHATE 10 MG/ML IJ SOLN
INTRAMUSCULAR | Status: AC
  Filled 2019-08-22: qty 1

## 2019-08-22 MED ORDER — OXYCODONE HCL 5 MG PO TABS
ORAL_TABLET | ORAL | Status: AC
  Filled 2019-08-22: qty 1

## 2019-08-22 MED ORDER — FENTANYL CITRATE (PF) 100 MCG/2ML IJ SOLN
INTRAMUSCULAR | Status: DC | PRN
  Administered 2019-08-22: 50 ug via INTRAVENOUS
  Administered 2019-08-22: 100 ug via INTRAVENOUS
  Administered 2019-08-22 (×3): 50 ug via INTRAVENOUS

## 2019-08-22 MED ORDER — MIDAZOLAM HCL 5 MG/5ML IJ SOLN
INTRAMUSCULAR | Status: DC | PRN
  Administered 2019-08-22: 2 mg via INTRAVENOUS

## 2019-08-22 MED ORDER — ACETAMINOPHEN 650 MG RE SUPP: 650.0000 mg | RECTAL | Status: DC | PRN

## 2019-08-22 MED ORDER — PROPOFOL 10 MG/ML IV BOLUS
INTRAVENOUS | Status: DC | PRN
  Administered 2019-08-22: 200 mg via INTRAVENOUS

## 2019-08-22 MED ORDER — MIDAZOLAM HCL 2 MG/2ML IJ SOLN
INTRAMUSCULAR | Status: AC
  Filled 2019-08-22: qty 2

## 2019-08-22 MED ORDER — FENTANYL CITRATE (PF) 100 MCG/2ML IJ SOLN: 25.0000 ug | INTRAMUSCULAR | Status: DC | PRN

## 2019-08-22 SURGICAL SUPPLY — 80 items
ADH SKN CLS APL DERMABOND .7 (GAUZE/BANDAGES/DRESSINGS) ×6
BAG DECANTER FOR FLEXI CONT (MISCELLANEOUS) IMPLANT
BINDER ABDOMINAL  9 SM 30-45 (SOFTGOODS)
BINDER ABDOMINAL 10 UNV 27-48 (MISCELLANEOUS) ×2 IMPLANT
BINDER ABDOMINAL 12 SM 30-45 (SOFTGOODS) IMPLANT
BINDER ABDOMINAL 9 SM 30-45 (SOFTGOODS) IMPLANT
BINDER BREAST LRG (GAUZE/BANDAGES/DRESSINGS) IMPLANT
BINDER BREAST MEDIUM (GAUZE/BANDAGES/DRESSINGS) IMPLANT
BINDER BREAST XLRG (GAUZE/BANDAGES/DRESSINGS) IMPLANT
BINDER BREAST XXLRG (GAUZE/BANDAGES/DRESSINGS) ×2 IMPLANT
BLADE HEX COATED 2.75 (ELECTRODE) ×5 IMPLANT
BLADE SURG 10 STRL SS (BLADE) ×7 IMPLANT
BLADE SURG 15 STRL LF DISP TIS (BLADE) ×3 IMPLANT
BLADE SURG 15 STRL SS (BLADE) ×5
BNDG GAUZE ELAST 4 BULKY (GAUZE/BANDAGES/DRESSINGS) ×10 IMPLANT
CANISTER SUCT 1200ML W/VALVE (MISCELLANEOUS) ×5 IMPLANT
CLOSURE WOUND 1/2 X4 (GAUZE/BANDAGES/DRESSINGS) ×1
COVER BACK TABLE 60X90IN (DRAPES) ×5 IMPLANT
COVER MAYO STAND STRL (DRAPES) ×5 IMPLANT
COVER WAND RF STERILE (DRAPES) IMPLANT
DECANTER SPIKE VIAL GLASS SM (MISCELLANEOUS) IMPLANT
DERMABOND ADVANCED (GAUZE/BANDAGES/DRESSINGS) ×4
DERMABOND ADVANCED .7 DNX12 (GAUZE/BANDAGES/DRESSINGS) ×3 IMPLANT
DRAIN CHANNEL 19F RND (DRAIN) IMPLANT
DRAPE LAPAROSCOPIC ABDOMINAL (DRAPES) ×5 IMPLANT
DRSG PAD ABDOMINAL 8X10 ST (GAUZE/BANDAGES/DRESSINGS) ×14 IMPLANT
ELECT BLADE 4.0 EZ CLEAN MEGAD (MISCELLANEOUS)
ELECT REM PT RETURN 9FT ADLT (ELECTROSURGICAL) ×5
ELECTRODE BLDE 4.0 EZ CLN MEGD (MISCELLANEOUS) IMPLANT
ELECTRODE REM PT RTRN 9FT ADLT (ELECTROSURGICAL) ×3 IMPLANT
EVACUATOR SILICONE 100CC (DRAIN) IMPLANT
EXTRACTOR CANIST REVOLVE STRL (CANNISTER) ×5 IMPLANT
GAUZE SPONGE 4X4 12PLY STRL (GAUZE/BANDAGES/DRESSINGS) ×2 IMPLANT
GAUZE SPONGE 4X4 12PLY STRL LF (GAUZE/BANDAGES/DRESSINGS) IMPLANT
GLOVE BIO SURGEON STRL SZ 6.5 (GLOVE) ×9 IMPLANT
GLOVE BIO SURGEONS STRL SZ 6.5 (GLOVE) ×3
GLOVE BIOGEL PI IND STRL 6.5 (GLOVE) IMPLANT
GLOVE BIOGEL PI IND STRL 7.5 (GLOVE) IMPLANT
GLOVE BIOGEL PI INDICATOR 6.5 (GLOVE) ×2
GLOVE BIOGEL PI INDICATOR 7.5 (GLOVE) ×2
GOWN STRL REUS W/ TWL LRG LVL3 (GOWN DISPOSABLE) ×6 IMPLANT
GOWN STRL REUS W/TWL LRG LVL3 (GOWN DISPOSABLE) ×10
IV LACTATED RINGERS 1000ML (IV SOLUTION) ×10 IMPLANT
LINER CANISTER 1000CC FLEX (MISCELLANEOUS) ×7 IMPLANT
NDL HYPO 25X1 1.5 SAFETY (NEEDLE) ×3 IMPLANT
NDL SAFETY ECLIPSE 18X1.5 (NEEDLE) ×3 IMPLANT
NEEDLE HYPO 18GX1.5 SHARP (NEEDLE) ×5
NEEDLE HYPO 25X1 1.5 SAFETY (NEEDLE) ×5 IMPLANT
NS IRRIG 1000ML POUR BTL (IV SOLUTION) IMPLANT
PACK BASIN DAY SURGERY FS (CUSTOM PROCEDURE TRAY) ×5 IMPLANT
PAD ALCOHOL SWAB (MISCELLANEOUS) ×5 IMPLANT
PENCIL SMOKE EVACUATOR (MISCELLANEOUS) ×5 IMPLANT
PIN SAFETY STERILE (MISCELLANEOUS) IMPLANT
SLEEVE SCD COMPRESS KNEE MED (MISCELLANEOUS) ×5 IMPLANT
SPONGE LAP 18X18 RF (DISPOSABLE) ×12 IMPLANT
STRIP CLOSURE SKIN 1/2X4 (GAUZE/BANDAGES/DRESSINGS) ×4 IMPLANT
STRIP SUTURE WOUND CLOSURE 1/2 (MISCELLANEOUS) ×5 IMPLANT
SUT MNCRL AB 4-0 PS2 18 (SUTURE) ×9 IMPLANT
SUT MON AB 3-0 SH 27 (SUTURE) ×10
SUT MON AB 3-0 SH27 (SUTURE) ×3 IMPLANT
SUT MON AB 4-0 PS1 27 (SUTURE) ×5 IMPLANT
SUT MON AB 5-0 PS2 18 (SUTURE) ×10 IMPLANT
SUT SILK 3 0 PS 1 (SUTURE) IMPLANT
SUT VIC AB 3-0 SH 27 (SUTURE)
SUT VIC AB 3-0 SH 27X BRD (SUTURE) ×3 IMPLANT
SYR 10ML LL (SYRINGE) ×20 IMPLANT
SYR 3ML 18GX1 1/2 (SYRINGE) IMPLANT
SYR 50ML LL SCALE MARK (SYRINGE) ×10 IMPLANT
SYR BULB IRRIGATION 50ML (SYRINGE) ×5 IMPLANT
SYR CONTROL 10ML LL (SYRINGE) ×11 IMPLANT
SYR TOOMEY 50ML (SYRINGE) ×8 IMPLANT
TAPE MEASURE VINYL STERILE (MISCELLANEOUS) ×3 IMPLANT
TOWEL GREEN STERILE FF (TOWEL DISPOSABLE) ×10 IMPLANT
TRAY DSU PREP LF (CUSTOM PROCEDURE TRAY) ×5 IMPLANT
TUBE CONNECTING 20'X1/4 (TUBING) ×1
TUBE CONNECTING 20X1/4 (TUBING) ×4 IMPLANT
TUBING INFILTRATION IT-10001 (TUBING) ×2 IMPLANT
TUBING SET GRADUATE ASPIR 12FT (MISCELLANEOUS) ×5 IMPLANT
UNDERPAD 30X36 HEAVY ABSORB (UNDERPADS AND DIAPERS) ×10 IMPLANT
YANKAUER SUCT BULB TIP NO VENT (SUCTIONS) ×5 IMPLANT

## 2019-08-22 NOTE — Transfer of Care (Signed)
Immediate Anesthesia Transfer of Care Note  Patient: Karen Hopkins  Procedure(s) Performed: Left breast mastopexy reduction with liposuction for breast symmetry (Left Breast) LIPOSUCTION WITH LIPOFILLING (Left )  Patient Location: PACU  Anesthesia Type:General  Level of Consciousness: sedated  Airway & Oxygen Therapy: Patient Spontanous Breathing and Patient connected to face mask oxygen  Post-op Assessment: Report given to RN and Post -op Vital signs reviewed and stable  Post vital signs: Reviewed and stable  Last Vitals:  Vitals Value Taken Time  BP 107/73 08/22/19 1033  Temp    Pulse 65 08/22/19 1034  Resp 23 08/22/19 1034  SpO2 95 % 08/22/19 1034  Vitals shown include unvalidated device data.  Last Pain:  Vitals:   08/22/19 0720  TempSrc: Oral  PainSc: 0-No pain      Patients Stated Pain Goal: 4 ([phone]  Complications: No apparent anesthesia complications

## 2019-08-22 NOTE — Interval H&P Note (Signed)
History and Physical Interval Note:  08/22/2019 7:56 AM  Karen Hopkins  has presented today for surgery, with the diagnosis of breast asymmetry following reconstruction.  The various methods of treatment have been discussed with the patient and family. After consideration of risks, benefits and other options for treatment, the patient has consented to  Procedure(s): Left breast mastopexy reduction with liposuction for breast symmetry (Left) POSSIBLE RECONSTRUCTION NIPPLE (Right) LIPOSUCTION WITH LIPOFILLING (Left) as a surgical intervention.  The patient's history has been reviewed, patient examined, no change in status, stable for surgery.  I have reviewed the patient's chart and labs.  Questions were answered to the patient's satisfaction.     Karen Hopkins

## 2019-08-22 NOTE — Anesthesia Procedure Notes (Signed)
Procedure Name: LMA Insertion Date/Time: 08/22/2019 8:30 AM Performed by: Willa Frater, CRNA Pre-anesthesia Checklist: Patient identified, Emergency Drugs available, Suction available and Patient being monitored Patient Re-evaluated:Patient Re-evaluated prior to induction Oxygen Delivery Method: Circle system utilized Preoxygenation: Pre-oxygenation with 100% oxygen Induction Type: IV induction Ventilation: Mask ventilation without difficulty LMA: LMA inserted LMA Size: 4.0 Number of attempts: 1 Airway Equipment and Method: Bite block Placement Confirmation: positive ETCO2 Tube secured with: Tape Dental Injury: Teeth and Oropharynx as per pre-operative assessment

## 2019-08-22 NOTE — Discharge Instructions (Signed)
INSTRUCTIONS FOR AFTER SURGERY   You will likely have some questions about what to expect following your operation.  The following information will help you and your family understand what to expect when you are discharged from the hospital.  Following these guidelines will help ensure a smooth recovery and reduce risks of complications.  Postoperative instructions include information on: diet, wound care, medications and physical activity.  AFTER SURGERY Expect to go home after the procedure.  In some cases, you may need to spend one night in the hospital for observation.  DIET This surgery does not require a specific diet.  However, I have to mention that the healthier you eat the better your body can start healing. It is important to increasing your protein intake.  This means limiting the foods with added sugar.  Focus on fruits and vegetables and some meat.  If you have any liposuction during your procedure be sure to drink water.  If your urine is bright yellow, then it is concentrated, and you need to drink more water.  As a general rule after surgery, you should have 8 ounces of water every hour while awake.  If you find you are persistently nauseated or unable to take in liquids let us know.  NO TOBACCO USE or EXPOSURE.  This will slow your healing process and increase the risk of a wound.  WOUND CARE If you don't have a drain: You can shower the day after surgery.  Use fragrance free soap.  Dial, Dove, Ivory and Cetaphil are usually mild on the skin.  If you have steri-strips / tape directly attached to your skin leave them in place. It is OK to get these wet.  No baths, pools or hot tubs for two weeks. We close your incision to leave the smallest and best-looking scar. No ointment or creams on your incisions until given the go ahead.  Especially not Neosporin (Too many skin reactions with this one).  A few weeks after surgery you can use Mederma and start massaging the scar. We ask you to  wear your binder or sports bra for the first 6 weeks around the clock, including while sleeping. This provides added comfort and helps reduce the fluid accumulation at the surgery site.  ACTIVITY No heavy lifting until cleared by the doctor.  It is OK to walk and climb stairs. In fact, moving your legs is very important to decrease your risk of a blood clot.  It will also help keep you from getting deconditioned.  Every 1 to 2 hours get up and walk for 5 minutes. This will help with a quicker recovery back to normal.  Let pain be your guide so you don't do too much.  NO, you cannot do the spring cleaning and don't plan on taking care of anyone else.  This is your time for TLC.   WORK Everyone returns to work at different times. As a rough guide, most people take at least 1 - 2 weeks off prior to returning to work. If you need documentation for your job, bring the forms to your postoperative follow up visit.  DRIVING Arrange for someone to bring you home from the hospital.  You may be able to drive a few days after surgery but not while taking any narcotics or valium.  BOWEL MOVEMENTS Constipation can occur after anesthesia and while taking pain medication.  It is important to stay ahead for your comfort.  We recommend taking Milk of Magnesia (2 tablespoons; twice   a day) while taking the pain pills.  SEROMA This is fluid your body tried to put in the surgical site.  This is normal but if it creates excessive pain and swelling let us know.  It usually decreases in a few weeks.  MEDICATIONS and PAIN CONTROL At your preoperative visit for you history and physical you were given the following medications: 1. An antibiotic: Start this medication when you get home and take according to the instructions on the bottle. 2. Zofran 4 mg:  This is to treat nausea and vomiting.  You can take this every 6 hours as needed and only if needed. 3. Norco (hydrocodone/acetaminophen) 5/325 mg:  This is only to be  used after you have taken the motrin or the tylenol. Every 8 hours as needed. Over the counter Medication to take: 4. Ibuprofen (Motrin) 600 mg:  Take this every 6 hours.  If you have additional pain then take 500 mg of the tylenol.  Only take the Norco after you have tried these two. 5. Miralax or stool softener of choice: Take this according to the bottle if you take the Norco.  WHEN TO CALL Call your surgeon's office if any of the following occur: . Fever 101 degrees F or greater . Excessive bleeding or fluid from the incision site. . Pain that increases over time without aid from the medications . Redness, warmth, or pus draining from incision sites . Persistent nausea or inability to take in liquids . Severe misshapen area that underwent the operation.   Post Anesthesia Home Care Instructions  Activity: Get plenty of rest for the remainder of the day. A responsible individual must stay with you for 24 hours following the procedure.  For the next 24 hours, DO NOT: -Drive a car -Operate machinery -Drink alcoholic beverages -Take any medication unless instructed by your physician -Make any legal decisions or sign important papers.  Meals: Start with liquid foods such as gelatin or soup. Progress to regular foods as tolerated. Avoid greasy, spicy, heavy foods. If nausea and/or vomiting occur, drink only clear liquids until the nausea and/or vomiting subsides. Call your physician if vomiting continues.  Special Instructions/Symptoms: Your throat may feel dry or sore from the anesthesia or the breathing tube placed in your throat during surgery. If this causes discomfort, gargle with warm salt water. The discomfort should disappear within 24 hours.     

## 2019-08-22 NOTE — Anesthesia Postprocedure Evaluation (Signed)
Anesthesia Post Note  Patient: Vertis Valliant [place]  Procedure(s) Performed: Left breast mastopexy reduction with liposuction for breast symmetry (Left Breast) LIPOSUCTION WITH LIPOFILLING (Left )     Patient location during evaluation: PACU Anesthesia Type: General Level of consciousness: awake and alert and oriented Pain management: pain level controlled Vital Signs Assessment: post-procedure vital signs reviewed and stable Respiratory status: spontaneous breathing, nonlabored ventilation and respiratory function stable Cardiovascular status: blood pressure returned to baseline and stable Postop Assessment: no apparent nausea or vomiting Anesthetic complications: no    Last Vitals:  Vitals:   08/22/19 1115 08/22/19 1200  BP: (!) 144/93 140/81  Pulse: 72 76  Resp: 15 18  Temp:  36.9 C  SpO2: 100% 100%    Last Pain:  Vitals:   08/22/19 1200  TempSrc: Oral  PainSc: 8                  Blanche Gallien A.

## 2019-08-22 NOTE — Op Note (Signed)
DATE OF OPERATION: 08/22/2019  LOCATION: [facility] Operating Room  PREOPERATIVE DIAGNOSIS: Breast asymmetry after breast reconstruction for right breast cancer  POSTOPERATIVE DIAGNOSIS: Same  PROCEDURE:  1. Excision of left lateral breast tissue for symmetry 2 x 6 cm 2. Mastopexy of left breast for symmetry 3. Right breast fat grafting for symmetry  SURGEON: Lyndee Leo Sanger Arbie Blankley, DO  ASSISTANT: Phoebe Sharps, PA  EBL: 5 cc  CONDITION: Stable  COMPLICATIONS: None  INDICATION: The patient, Karen Hopkins, is a 44 y.o. female born on 1976/06/01, is here for treatment of breast asymmetry due to right breast cancer. She underwent reconstruction with a right latissimus flap and implant placement.  She had a left breast reduction.  She had successful weight reduction which created further breast asymmetry and presents for treatment .  PROCEDURE DETAILS:  The patient was seen prior to surgery and marked.  The IV antibiotics were given. The patient was taken to the operating room and given a general anesthetic. A standard time out was performed and all information was confirmed by those in the room. SCDs were placed.  The breast and abdomen was prepped and draped.  Local was injected at the superior umbilical area for intraoperative hemostasis and postop pain control.  A 15 blade was used to make a 1 cm incision.  Through that incision the tumescent was infused into the abdominal fat.  After waiting several minutes for the local to take effect the liposuction was performed for fat procurement.  The fat was collected in the revolve system and washed according to the manufacture guidelines.  The fat was placed in 10 cc syringe  The fat was then grafted to the right superior medial breast for improved symmetry.  This was done through a 5 mm incision at the medial aspect of the upper incision of the latissimus muscle.  This was then closed with 5-0 Monocryl.  The abdominal incision was  closed with 5-0 Monocryl.    Left breast:  There was asymmetry with the left breast.  A mastopexy was done.  The markings were done preoperatively and confirmed in the OR.  Area was raised 1/2 cm.  The periareolar skin was de-epithelialized.  The nipple was then raised into position and secured with a 4 Monocryl.  The vertical limb was de-epithelialized in a V shape.  This was then brought together with several 3-0 Monocryl's the following layers were closed with 4-0 and 5-0 Monocryl.  The area was then closed with the remaining 4 and 5-0 Monocryl.  Attention was then turned to the lateral aspect of the left breast.  Liposuction was done laterally to improve the contour.  Once that was done a 2 x 6 cm area of skin and fat was excised.  This was brought together with a 3-0 Monocryl and then an additional 4-0 Monocryl was used for the final layer.  Dermabond and Steri-Strips were applied.  The patient was placed in an abdominal binder and a breast binder.  The patient was allowed to wake up and taken to recovery room in stable condition at the end of the case. The family was notified at the end of the case.   The advanced practice practitioner (APP) assisted throughout the case.  The APP was essential in retraction and counter traction when needed to make the case progress smoothly.  This retraction and assistance made it possible to see the tissue plans for the procedure.  The assistance was needed for blood control, tissue re-approximation and  assisted with closure of the incision site.  The [place] was signed into law in 2016 which includes the topic of electronic health records.  This provides immediate access to information in MyChart.  This includes consultation notes, operative notes, office notes, lab results and pathology reports.  If you have any questions about what you read please let us know at your next visit or call us at the office.  We are right here with you.

## 2019-08-23 ENCOUNTER — Encounter: Payer: Self-pay | Admitting: *Deleted

## 2019-08-24 ENCOUNTER — Ambulatory Visit: Payer: Self-pay | Admitting: Genetic Counselor

## 2019-08-24 DIAGNOSIS — C50811 Malignant neoplasm of overlapping sites of right female breast: Secondary | ICD-10-CM

## 2019-08-24 DIAGNOSIS — Z170 Estrogen receptor positive status [ER+]: Secondary | ICD-10-CM

## 2019-08-24 DIAGNOSIS — Z1379 Encounter for other screening for genetic and chromosomal anomalies: Secondary | ICD-10-CM

## 2019-08-24 NOTE — Telephone Encounter (Signed)
Revealed negative genetic testing.  Discussed that we do not know why she has breast cancer or why there is cancer in the family. It could be due to a different gene that we are not testing, or maybe our current technology may not be able to pick something up.  It will be important for her to keep in contact with genetics to keep up with whether additional testing may be needed. 

## 2019-08-24 NOTE — Progress Notes (Signed)
HPI:  [name] was previously seen in the [facility] clinic due to a personal and family history of breast cancer and concerns regarding a hereditary predisposition to cancer. Please refer to our prior cancer genetics clinic note for more information regarding our discussion, assessment and recommendations, at the time. [name] recent genetic test results were disclosed to her, as were recommendations warranted by these results. These results and recommendations are discussed in more detail below.  CANCER HISTORY:  Oncology History  Malignant neoplasm of overlapping sites of right breast in female, estrogen receptor positive ([place])  05/24/2014 Initial Diagnosis   Malignant neoplasm of overlapping sites of right breast in female, estrogen receptor positive ([place])   08/13/2019 Genetic Testing   Negative genetic testing on the CustomNext-Cancer panel +RNAinsight.  The CustomNext-Cancer gene panel offered by [facility] and includes sequencing and rearrangement analysis for the following 91 genes: AIP, ALK, APC*, ATM*, AXIN2, BAP1, BARD1, BLM, BMPR1A, BRCA1*, BRCA2*, BRIP1*, CDC73, CDH1*, CDK4, CDKN1B, CDKN2A, CHEK2*, CTNNA1, DICER1, FANCC, FH, FLCN, GALNT12, KIF1B, LZTR1, MAX, MEN1, MET, MLH1*, MRE11A, MSH2*, MSH3, MSH6*, MUTYH*, NBN, NF1*, NF2, NTHL1, PALB2*, PHOX2B, PMS2*, POT1, PRKAR1A, PTCH1, PTEN*, RAD50, RAD51C*, RAD51D*, RB1, RECQL, RET, SDHA, SDHAF2, SDHB, SDHC, SDHD, SMAD4, SMARCA4, SMARCB1, SMARCE1, STK11, SUFU, TMEM127, TP53*, TSC1, TSC2, VHL and XRCC2 (sequencing and deletion/duplication); CASR, CFTR, CPA1, CTRC, EGFR, EGLN1, FAM175A, HOXB13, KIT, MITF, MLH3, PALLD, PDGFRA, POLD1, POLE, PRSS1, RINT1, RPS20, SPINK1 and TERT (sequencing only); EPCAM and GREM1 (deletion/duplication only). DNA and RNA analyses performed for * genes.  The report date is August 13, 2019.     FAMILY HISTORY:  We obtained a detailed, 4-generation family history.  Significant diagnoses are listed  below: Family History  Problem Relation Age of Onset  . Hypertension Mother   . Breast cancer Mother 5  . Diabetes type II Father   . Prostate cancer Father 68  . Stroke Neg Hx   . Colon polyps Neg Hx   . Esophageal cancer Neg Hx   . Pancreatic cancer Neg Hx   . Stomach cancer Neg Hx   . Rectal cancer Neg Hx     The patient has three boys who are cancer free.  She has two sisters who do not have cancer.  Her mother is deceased and her father is living.  The patient's mother was diagnosed with stage IV breast cancer at 27.  She had 10 siblings who did not have cancer.  There is no other reported maternal family history of cancer.  The patient's father was diagnosed with prostate cancer at 41.  He has three sisters who are cancer free.  There is no other reported family history of cancer.  [name] is unaware of previous family history of genetic testing for hereditary cancer risks. Patient's maternal ancestors are of African American descent, and paternal ancestors are of African American descent. There is no reported Ashkenazi Jewish ancestry. There is no known consanguinity.    GENETIC TEST RESULTS: Genetic testing reported out on August 13, 2019 through the CustomNext-Cancer+RNAinsight cancer panel found no pathogenic mutations. The CustomNext-Cancer gene panel offered by [facility] and includes sequencing and rearrangement analysis for the following 91 genes: AIP, ALK, APC*, ATM*, AXIN2, BAP1, BARD1, BLM, BMPR1A, BRCA1*, BRCA2*, BRIP1*, CDC73, CDH1*, CDK4, CDKN1B, CDKN2A, CHEK2*, CTNNA1, DICER1, FANCC, FH, FLCN, GALNT12, KIF1B, LZTR1, MAX, MEN1, MET, MLH1*, MRE11A, MSH2*, MSH3, MSH6*, MUTYH*, NBN, NF1*, NF2, NTHL1, PALB2*, PHOX2B, PMS2*, POT1, PRKAR1A, PTCH1, PTEN*, RAD50, RAD51C*, RAD51D*, RB1, RECQL,  RET, SDHA, SDHAF2, SDHB, SDHC, SDHD, SMAD4, SMARCA4, SMARCB1, SMARCE1, STK11, SUFU, TMEM127, TP53*, TSC1, TSC2, VHL and XRCC2 (sequencing and deletion/duplication); CASR, CFTR,  CPA1, CTRC, EGFR, EGLN1, FAM175A, HOXB13, KIT, MITF, MLH3, PALLD, PDGFRA, POLD1, POLE, PRSS1, RINT1, RPS20, SPINK1 and TERT (sequencing only); EPCAM and GREM1 (deletion/duplication only). DNA and RNA analyses performed for * genes. The test report has been scanned into EPIC and is located under the Molecular Pathology section of the Results Review tab.  A portion of the result report is included below for reference.     We discussed with [name] that because current genetic testing is not perfect, it is possible there may be a gene mutation in one of these genes that current testing cannot detect, but that chance is small.  We also discussed, that there could be another gene that has not yet been discovered, or that we have not yet tested, that is responsible for the cancer diagnoses in the family. It is also possible there is a hereditary cause for the cancer in the family that [name] did not inherit and therefore was not identified in her testing.  Therefore, it is important to remain in touch with cancer genetics in the future so that we can continue to offer [name] the most up to date genetic testing.   ADDITIONAL GENETIC TESTING: We discussed with [name] that her genetic testing was fairly extensive.  If there are genes identified to increase cancer risk that can be analyzed in the future, we would be happy to discuss and coordinate this testing at that time.    CANCER SCREENING RECOMMENDATIONS: [name] test result is considered negative (normal).  This means that we have not identified a hereditary cause for her personal and family history of breast cancer at this time. Most cancers happen by chance and this negative test suggests that her cancer may fall into this category.    While reassuring, this does not definitively rule out a hereditary predisposition to cancer. It is still possible that there could be genetic mutations that are undetectable by current technology. There  could be genetic mutations in genes that have not been tested or identified to increase cancer risk.  Therefore, it is recommended she continue to follow the cancer management and screening guidelines provided by her oncology and primary healthcare provider.   An individual's cancer risk and medical management are not determined by genetic test results alone. Overall cancer risk assessment incorporates additional factors, including personal medical history, family history, and any available genetic information that may result in a personalized plan for cancer prevention and surveillance  RECOMMENDATIONS FOR FAMILY MEMBERS:  Individuals in this family might be at some increased risk of developing cancer, over the general population risk, simply due to the family history of cancer.  We recommended women in this family have a yearly mammogram beginning at age 59, or 78 years younger than the earliest onset of cancer, an annual clinical breast exam, and perform monthly breast self-exams. Women in this family should also have a gynecological exam as recommended by their primary provider. All family members should have a colonoscopy by age 26.  FOLLOW-UP: Lastly, we discussed with [name] that cancer genetics is a rapidly advancing field and it is possible that new genetic tests will be appropriate for her and/or her family members in the future. We encouraged her to remain in contact with cancer genetics on an annual basis so we can update her personal and family  histories and let her know of advances in cancer genetics that may benefit this family.   Our contact number was provided. [name] questions were answered to her satisfaction, and she knows she is welcome to call us at anytime with additional questions or concerns.   Roma Kayser, [place], [facility] Licensed, Certified Genetic Counselor [name]

## 2019-08-31 ENCOUNTER — Ambulatory Visit (INDEPENDENT_AMBULATORY_CARE_PROVIDER_SITE_OTHER): Payer: Medicare Other | Admitting: Plastic Surgery

## 2019-08-31 ENCOUNTER — Other Ambulatory Visit: Payer: Self-pay

## 2019-08-31 ENCOUNTER — Encounter: Payer: Self-pay | Admitting: Plastic Surgery

## 2019-08-31 VITALS — BP 118/78 | HR 92 | Temp 97.3°F | Ht 64.0 in | Wt 198.0 lb

## 2019-08-31 DIAGNOSIS — N651 Disproportion of reconstructed breast: Secondary | ICD-10-CM

## 2019-08-31 NOTE — Progress Notes (Signed)
   Subjective:    Patient ID: Karen Hopkins, female    DOB: June 18, 1975, 44 y.o.   MRN: [id number]  The patient is a 44 year old female here for follow-up after undergoing her breast surgery.  She had fat grafting to the right breast and a mastopexy to the left breast.  She has bruising and swelling as expected in both her abdomen and her right breast.  The incisions are closed and healing nicely.  There is no sign of seroma or hematoma.     Review of Systems  Constitutional: Positive for activity change.  HENT: Negative.   Eyes: Negative.   Respiratory: Negative.   Cardiovascular: Negative.   Gastrointestinal: Negative.   Musculoskeletal: Negative.        Objective:   Physical Exam Vitals and nursing note reviewed.  HENT:     Head: Normocephalic and atraumatic.  Cardiovascular:     Pulses: Normal pulses.  Pulmonary:     Effort: Pulmonary effort is normal.  Abdominal:     Tenderness: There is abdominal tenderness.  Neurological:     General: No focal deficit present.     Mental Status: She is alert and oriented to [place], place, and time.  Psychiatric:        Mood and Affect: Mood normal.        Behavior: Behavior normal.       Assessment & Plan:     ICD-10-CM   1. Breast asymmetry following reconstructive surgery  N65.1     Continue with spanx and sports bra.  May shower. Start massage to right breast and abdomen.  Follow up in 2 weeks.   Pictures were obtained of the patient and placed in the chart with the patient's or guardian's permission.

## 2019-09-14 ENCOUNTER — Ambulatory Visit (INDEPENDENT_AMBULATORY_CARE_PROVIDER_SITE_OTHER): Payer: Medicare Other | Admitting: Surgical

## 2019-09-14 ENCOUNTER — Other Ambulatory Visit: Payer: Self-pay

## 2019-09-14 ENCOUNTER — Encounter: Payer: Self-pay | Admitting: Surgical

## 2019-09-14 VITALS — BP 108/57 | HR 92 | Temp 98.0°F | Ht 64.0 in | Wt 198.0 lb

## 2019-09-14 DIAGNOSIS — Z9882 Breast implant status: Secondary | ICD-10-CM

## 2019-09-14 DIAGNOSIS — Z170 Estrogen receptor positive status [ER+]: Secondary | ICD-10-CM

## 2019-09-14 DIAGNOSIS — N651 Disproportion of reconstructed breast: Secondary | ICD-10-CM

## 2019-09-14 DIAGNOSIS — C50811 Malignant neoplasm of overlapping sites of right female breast: Secondary | ICD-10-CM

## 2019-09-14 NOTE — Progress Notes (Signed)
Patient is a 44 year old female here for follow-up after excision of left lateral breast tissue, mastopexy left breast, right breast fat grafting on 08/22/2019 by Dr. Marla Roe.  She is doing well today.  No complaints other than some mild abdominal swelling, abdominal tenderness with palpation.  She notes she has a small knot that she noticed on her right central abdomen.   Denies any fever, chills, nausea, vomiting, chest pain, shortness of breath.  Bruising has resolved.  Swelling is minimal.  Incisions are clean dry and intact, healing nicely.  I did remove some Steri-Strips today from her left lateral breast incision as well as inferior vertical limb incision.  No sign of any dehiscence.  No drainage noted.  No erythema noted.  Minimal tenderness palpation.  Left NAC with good color.  Right latissimus flap with good color, incision healing nicely.  2+ DP pulses, no leg swelling noted.  No sign of any seroma or hematoma.  No sign of infection. Suture knots removed from umbilicus, left lateral breast incision, right breast. Recommend continuing to wear compressive devices on abdomen and breast.  She can sleep on her right side.  Avoid sleeping flat on stomach.  Recommend removing Steri-Strips in 1 to 2 weeks.  Follow-up in 3 weeks for reevaluation.  Call with any questions or concerns.

## 2019-10-05 ENCOUNTER — Ambulatory Visit (INDEPENDENT_AMBULATORY_CARE_PROVIDER_SITE_OTHER): Payer: Medicare Other | Admitting: Surgical

## 2019-10-05 ENCOUNTER — Encounter: Payer: Self-pay | Admitting: Surgical

## 2019-10-05 DIAGNOSIS — Z9889 Other specified postprocedural states: Secondary | ICD-10-CM

## 2019-10-05 DIAGNOSIS — C50811 Malignant neoplasm of overlapping sites of right female breast: Secondary | ICD-10-CM

## 2019-10-05 DIAGNOSIS — Z9882 Breast implant status: Secondary | ICD-10-CM

## 2019-10-05 DIAGNOSIS — N651 Disproportion of reconstructed breast: Secondary | ICD-10-CM

## 2019-10-05 DIAGNOSIS — Z170 Estrogen receptor positive status [ER+]: Secondary | ICD-10-CM

## 2019-10-05 NOTE — Progress Notes (Signed)
No show

## 2019-10-17 ENCOUNTER — Ambulatory Visit: Payer: Medicare Other | Attending: Internal Medicine

## 2019-10-17 DIAGNOSIS — Z20822 Contact with and (suspected) exposure to covid-19: Secondary | ICD-10-CM

## 2019-10-18 LAB — SARS-COV-2, NAA 2 DAY TAT

## 2019-10-18 LAB — NOVEL CORONAVIRUS, NAA: SARS-CoV-2, NAA: NOT DETECTED

## 2019-10-30 ENCOUNTER — Encounter: Payer: Self-pay | Admitting: General Practice

## 2019-12-27 NOTE — Progress Notes (Deleted)
44 y.o. [id number] Single Black or [place] American female here for annual exam.    No LMP recorded. (Menstrual status: IUD).          Sexually active: {yes no:314532}  The current method of family planning is {contraception:315051}.    Exercising: {yes no:314532}  {types:19826} Smoker:  {YES NO:22349}  Health Maintenance: Pap:  10-19-2018  Atypical glandular cells HPV HR +,06-28-19 neg History of abnormal Pap:  {YES NO:22349} MMG:  *** Colonoscopy:  none BMD:   none TDaP:  *** Pneumonia vaccine(s):  2018 Shingrix:   *** Hep C testing: *** Screening Labs: ***   reports that she quit smoking about 7 months ago. Her smoking use included cigarettes. She started smoking about 17 years ago. She smoked 0.00 packs per day for 0.00 years. She has never used smokeless tobacco. She reports that she does not drink alcohol and does not use drugs.  Past Medical History:  Diagnosis Date  . Anxiety   . Arthritis   . Cancer ([place]) 2011   Rt. Br. Ca  . Depression   . Diabetes mellitus without complication (HCC)    has been "borderline"  . Essential hypertension 11/05/2015  . History of radiation therapy   . Hypertension    under control with med., has been on med. x 2 yr.  . Lymphedema of arm    right; no BP or puncture to right arm  . Personal history of chemotherapy 09/16/2009   rt breast  . Pneumonia   . Seasonal allergies   . Sinus headache     Past Surgical History:  Procedure Laterality Date  . BREAST BIOPSY Right 08/26/2009  . BREAST REDUCTION SURGERY Left 08/23/2013   Procedure: LEFT BREAST REDUCTION  ;  Surgeon: Theodoro Kos, DO;  Location: [place];  Service: Plastics;  Laterality: Left;  . BREAST REDUCTION WITH MASTOPEXY Left 08/22/2019   Procedure: Left breast mastopexy reduction with liposuction for breast symmetry;  Surgeon: Wallace Going, DO;  Location: [place];  Service: Plastics;  Laterality: Left;  . CESAREAN SECTION  07/15/2001;  03/18/2004; 11/21/2008  . lateral orbiotomy Left 11/2015   Dr. Toy Cookey at [facility]  . LATISSIMUS FLAP TO BREAST Right 03/12/2013   Procedure: RIGHT BREAST LATISSIMUS FLAP WITH EXPANDER PLACEMENT;  Surgeon: Theodoro Kos, DO;  Location: [place];  Service: Plastics;  Laterality: Right;  . LIPOSUCTION Bilateral 08/23/2013   Procedure: LIPOSUCTION;  Surgeon: Theodoro Kos, DO;  Location: [place];  Service: Plastics;  Laterality: Bilateral;  . LIPOSUCTION WITH LIPOFILLING Left 08/22/2019   Procedure: LIPOSUCTION WITH LIPOFILLING;  Surgeon: Wallace Going, DO;  Location: [place];  Service: Plastics;  Laterality: Left;  Marland Kitchen MASTECTOMY Right 2011  . MODIFIED RADICAL MASTECTOMY Right 03/11/2010  . NASAL SEPTUM SURGERY    . ORIF ANKLE FRACTURE Left 07/18/2018   Procedure: OPEN REDUCTION INTERNAL FIXATION (ORIF) LEFT ANKLE FRACTURE WITH  SYNDESMOSIS AND ANKLE ARTHROTOMY;  Surgeon: Erle Crocker, MD;  Location: [place];  Service: Orthopedics;  Laterality: Left;  . PORT-A-CATH REMOVAL Left 12/01/2010  . PORTACATH PLACEMENT Left 09/12/2009  . REDUCTION MAMMAPLASTY Left 08/2013  . REMOVAL OF TISSUE EXPANDER AND PLACEMENT OF IMPLANT Right 08/23/2013   Procedure: REMOVAL RIGHT TISSUE EXPANDER AND PLACEMENT OF IMPLANT TO RIGHT BREAST ;  Surgeon: Theodoro Kos, DO;  Location: [facility] [place];  Service: Plastics;  Laterality: Right;  . SUPRA-UMBILICAL HERNIA  [id number]  . TUBAL LIGATION  11/21/2008  Current Outpatient Medications  Medication Sig Dispense Refill  . albuterol (PROAIR HFA) 108 (90 Base) MCG/ACT inhaler Inhale 2 puffs into the lungs every 6 (six) hours as needed for wheezing or shortness of breath. 18 g 3  . ibuprofen (ADVIL) 600 MG tablet Take 1 tablet (600 mg total) by mouth every 8 (eight) hours as needed. 30 tablet 0  . levonorgestrel (MIRENA) 20 MCG/24HR IUD 1 each by Intrauterine route once.    . potassium chloride (MICRO-K) 10 MEQ CR  capsule TAKE 1 CAPSULE BY MOUTH 2 TIMES DAILY. 30 capsule 3  . topiramate (TOPAMAX) 50 MG tablet TAKE 3 TABLETS BY MOUTH AT BEDTIME 90 tablet 0   No current facility-administered medications for this visit.    Family History  Problem Relation Age of Onset  . Hypertension Mother   . Breast cancer Mother 24  . Diabetes type II Father   . Prostate cancer Father 1  . Stroke Neg Hx   . Colon polyps Neg Hx   . Esophageal cancer Neg Hx   . Pancreatic cancer Neg Hx   . Stomach cancer Neg Hx   . Rectal cancer Neg Hx     Review of Systems  Exam:   There were no vitals taken for this visit.     General appearance: alert, cooperative and appears stated age Head: Normocephalic, without obvious abnormality, atraumatic Neck: no adenopathy, supple, symmetrical, trachea midline and thyroid {EXAM; THYROID:18604} Lungs: clear to auscultation bilaterally Breasts: {Exam; breast:13139::"normal appearance, no masses or tenderness"} Heart: regular rate and rhythm Abdomen: soft, non-tender; bowel sounds normal; no masses,  no organomegaly Extremities: extremities normal, atraumatic, no cyanosis or edema Skin: Skin color, texture, turgor normal. No rashes or lesions Lymph nodes: Cervical, supraclavicular, and axillary nodes normal. No abnormal inguinal nodes palpated Neurologic: Grossly normal   Pelvic: External genitalia:  no lesions              Urethra:  normal appearing urethra with no masses, tenderness or lesions              Bartholins and Skenes: normal                 Vagina: normal appearing vagina with normal color and discharge, no lesions              Cervix: {exam; cervix:14595}              Pap taken: {yes no:314532} Bimanual Exam:  Uterus:  {exam; uterus:12215}              Adnexa: {exam; adnexa:12223}               Rectovaginal: Confirms               Anus:  normal sphincter tone, no lesions  Chaperone, ***Terence Lux, CMA, was present for exam.  A:  Well Woman with  normal exam  P:   {plan; gyn:5269::"mammogram","pap smear","return annually or prn"}

## 2019-12-31 ENCOUNTER — Ambulatory Visit: Payer: Medicare Other | Admitting: Obstetrics & Gynecology

## 2020-06-25 ENCOUNTER — Other Ambulatory Visit: Payer: Self-pay | Admitting: *Deleted

## 2020-06-25 DIAGNOSIS — C50811 Malignant neoplasm of overlapping sites of right female breast: Secondary | ICD-10-CM

## 2020-06-25 NOTE — Progress Notes (Incomplete)
[place]  [phone] Fax:(336) [phone]   ID: Sheela Mcculley Neises   DOB: 1975-11-25  MR#: [id number]  PFX#:[id number]  Patient Care Team: [name], MD as PCP - General (Pediatrics) Melvenia Beam, MD as Consulting Physician (Neurology) Letta Pate Luanna Salk, MD as Consulting Physician (Physical Medicine and Rehabilitation) Dillingham, Loel Lofty, DO as Attending Physician (Plastic Surgery) Skeet Latch, MD as Attending Physician (Cardiology) Magrinat, Virgie Dad, MD as Consulting Physician (Oncology) Montez Morita, MD as Referring Physician (Family Medicine) Neldon Mc, MD as Consulting Physician (General Surgery) Dillingham, Loel Lofty, DO as Attending Physician (Plastic Surgery) Erle Crocker, MD as Consulting Physician (Orthopedic Surgery) Megan Salon, MD as Consulting Physician (Gynecology)   CHIEF COMPLAINT:  Locally advanced right breast cancer (s/p right mastectomy)  CURRENT TREATMENT: Observation   INTERVAL HISTORY: Emmalyne returns today for follow-up of her estrogen receptor positive breast cancer. She continues under observation.  Since her last visit, she underwent genetic testing on 07/11/2019. Results were negative.  She also underwent left breast mastopexy on 08/22/2019 under Dr. Marla Roe for breast symmetry.  Her most recent mammography was a left screening mammogram in 10/2016 at [facility].   REVIEW OF SYSTEMS: [place]    COVID 19 VACCINATION STATUS:    HISTORY OF PRESENT ILLNESS: From the original intake note:  The patient noted swelling and redness, as well as some tenderness, over her right breast for some time. She thought this might be related to pregnancy, since she delivered her third child about 8 months ago. As the problem did not improve, she brought it to her primary care physician's attention. He treated her with Bactrim, which she says she could not tolerate because of nausea and  vomiting, but which in any case did not resolve the problem, so he set her up for mammography at [facility] 08/26/2009. Dr. Sadie Haber found by exam marked skin thickening and erythema of the right breast extending pretty much throughout the breast. By mammography there were scattered fibroglandular densities, and an asymmetric density in the right upper outer quadrant with suspicious microcalcifications. There were also enlarged right axillary lymph nodes. The left appeared normal. By ultrasound there was an ill defined area of hypoechoic tissue in the right upper outer quadrant that was difficult to measure. There were also abnormal right axillary lymph nodes.  Ultrasound guided biopsy was performed the same day, and showed [phone]) a poorly differentiated invasive ductal carcinoma, which was estrogen receptor poor at 3%, progesterone receptor positive at 14% with an elevated proliferation marker at 50%, but importantly with strong amplification of HER-2 by CISH with a ratio of 5.20.  Bilateral breast MRIs were obtained 09/02/2009. This showed an area up to 10.6 cm in the right breast showing confluent mass and non-mass enhancement. There was marked skin thickening involving the entire right breast, and numerous bulky right axillary lymph nodes were identified. The left breast was unremarkable, and there was no evidence of internal mammary lymph node involvement.  Patient was treated in the neoadjuvant setting with 4 dose dense cycles of doxorubicin and cyclophosphamide, followed by 12 weekly doses of paclitaxel and trastuzumab. Naraly underwent definitive right modified radical mastectomy in October 2011, with a residual 2.5 mm area of tumor in the breast, grade 3, and one of 21 lymph nodes involved.   Her subsequent history is as detailed above.   PAST MEDICAL HISTORY: Past Medical History:  Diagnosis Date  . Anxiety   . Arthritis   .  Cancer ([place]) July 25, 2009   Rt. Br. Ca  . Depression   .  Diabetes mellitus without complication (HCC)    has been "borderline"  . Essential hypertension 11/05/2015  . History of radiation therapy   . Hypertension    under control with med., has been on med. x 2 yr.  . Lymphedema of arm    right; no BP or puncture to right arm  . Personal history of chemotherapy 09/16/2009   rt breast  . Pneumonia   . Seasonal allergies   . Sinus headache     PAST SURGICAL HISTORY: Past Surgical History:  Procedure Laterality Date  . BREAST BIOPSY Right 08/26/2009  . BREAST REDUCTION SURGERY Left 08/23/2013   Procedure: LEFT BREAST REDUCTION  ;  Surgeon: Theodoro Kos, DO;  Location: [facility];  Service: Plastics;  Laterality: Left;  . BREAST REDUCTION WITH MASTOPEXY Left 08/22/2019   Procedure: Left breast mastopexy reduction with liposuction for breast symmetry;  Surgeon: Wallace Going, DO;  Location: [facility];  Service: Plastics;  Laterality: Left;  . CESAREAN SECTION  07/15/2001; 03/18/2004; 11/21/2008  . lateral orbiotomy Left 11/2015   Dr. Toy Cookey at [facility]  . LATISSIMUS FLAP TO BREAST Right 03/12/2013   Procedure: RIGHT BREAST LATISSIMUS FLAP WITH EXPANDER PLACEMENT;  Surgeon: Theodoro Kos, DO;  Location: [facility];  Service: Plastics;  Laterality: Right;  . LIPOSUCTION Bilateral 08/23/2013   Procedure: LIPOSUCTION;  Surgeon: Theodoro Kos, DO;  Location: [facility];  Service: Plastics;  Laterality: Bilateral;  . LIPOSUCTION WITH LIPOFILLING Left 08/22/2019   Procedure: LIPOSUCTION WITH LIPOFILLING;  Surgeon: Wallace Going, DO;  Location: [facility];  Service: Plastics;  Laterality: Left;  Marland Kitchen MASTECTOMY Right Jul 25, 2009  . MODIFIED RADICAL MASTECTOMY Right 03/11/2010  . NASAL SEPTUM SURGERY    . ORIF ANKLE FRACTURE Left 07/18/2018   Procedure: OPEN REDUCTION INTERNAL FIXATION (ORIF) LEFT ANKLE FRACTURE WITH  SYNDESMOSIS AND ANKLE ARTHROTOMY;  Surgeon: Erle Crocker, MD;  Location: [facility];  Service: Orthopedics;  Laterality: Left;  . PORT-A-CATH REMOVAL Left 12/01/2010  . PORTACATH PLACEMENT Left 09/12/2009  . REDUCTION MAMMAPLASTY Left 08/2013  . REMOVAL OF TISSUE EXPANDER AND PLACEMENT OF IMPLANT Right 08/23/2013   Procedure: REMOVAL RIGHT TISSUE EXPANDER AND PLACEMENT OF IMPLANT TO RIGHT BREAST ;  Surgeon: Theodoro Kos, DO;  Location: [facility];  Service: Plastics;  Laterality: Right;  . SUPRA-UMBILICAL HERNIA  2725  . TUBAL LIGATION  11/21/2008    FAMILY HISTORY Family History  Problem Relation Age of Onset  . Hypertension Mother   . Breast cancer Mother 27  . Diabetes type II Father   . Prostate cancer Father 77  . Stroke Neg Hx   . Colon polyps Neg Hx   . Esophageal cancer Neg Hx   . Pancreatic cancer Neg Hx   . Stomach cancer Neg Hx   . Rectal cancer Neg Hx   The patient's mother was diagnosed with stage IV breast cancer in 07-25-16 and passed away in 10-22-2017.  Also of the patient's mother's mother's six sisters, two (the patient's great-aunts) had ovarian cancer.   GYNECOLOGIC HISTORY:    The patient is GX, P3. First child was premature. Age at first delivery, 45 years old.   she status post tubal ligation. She continues to have menstrual cycles, although irregularly. Mirena IUD placed for this in 05/2019.   SOCIAL HISTORY: (Updated January 2021 She is disabled. Jocabed's children  are [name], and [name]. They are 49, 15 and 10 as of January 2021 they are all boys, all at home. The patient attends a non-denominational church in [place], which she considers her home, but she is currently living in [place].   ADVANCED DIRECTIVES: not in place   HEALTH MAINTENANCE:  Social History   Tobacco Use  . Smoking status: Former Smoker    Packs/day: 0.00    Years: 0.00    Pack years: 0.00    Types: Cigarettes    Start date: 06/07/2002    Quit date: 05/08/2019    Years since quitting: 1.1  . Smokeless tobacco: Never Used   Vaping Use  . Vaping Use: Never used  Substance Use Topics  . Alcohol use: No  . Drug use: No    Colonoscopy: 01/2019, adenoma (Dr. Tarri Glenn) repeat 2027  PAP: 10/2018, abnormal and HPV+ (Dr. Sabra Heck)  Bone density: Never  Allergies  Allergen Reactions  . Penicillins Swelling    FACIAL SWELLING Did it involve swelling of the face/tongue/throat, SOB, or low BP? Yes Did it involve sudden or severe rash/hives, skin peeling, or any reaction on the inside of your mouth or nose? Unknown Did you need to seek medical attention at a hospital or doctor's office? No When did it last happen?More than 10 years If all above answers are "NO", may proceed with cephalosporin use.   Recardo Evangelist [Pregabalin] Nausea Only    .  Marland Kitchen Meloxicam Nausea Only  . Robaxin [Methocarbamol] Nausea Only    Current Outpatient Medications  Medication Sig Dispense Refill  . albuterol (PROAIR HFA) 108 (90 Base) MCG/ACT inhaler Inhale 2 puffs into the lungs every 6 (six) hours as needed for wheezing or shortness of breath. 18 g 3  . ibuprofen (ADVIL) 600 MG tablet Take 1 tablet (600 mg total) by mouth every 8 (eight) hours as needed. 30 tablet 0  . levonorgestrel (MIRENA) 20 MCG/24HR IUD 1 each by Intrauterine route once.    . potassium chloride (MICRO-K) 10 MEQ CR capsule TAKE 1 CAPSULE BY MOUTH 2 TIMES DAILY. 30 capsule 3  . topiramate (TOPAMAX) 50 MG tablet TAKE 3 TABLETS BY MOUTH AT BEDTIME 90 tablet 0   No current facility-administered medications for this visit.    OBJECTIVE: Young African-American woman in no acute distress  There were no vitals filed for this visit.   There is no height or weight on file to calculate BMI.    ECOG FS: 2  There were no vitals filed for this visit.  Sclerae unicteric, EOMs intact Wearing a mask No cervical or supraclavicular adenopathy Lungs no rales or rhonchi Heart regular rate and rhythm Abd soft, nontender, positive bowel sounds MSK no focal spinal tenderness, no upper  extremity lymphedema Neuro: nonfocal, well oriented, appropriate affect Breasts:    {Sclerae unicteric, EOMs intact Wearing a mask No cervical or supraclavicular adenopathy Lungs no rales or rhonchi Heart regular rate and rhythm Abd soft, nontender, positive bowel sounds MSK no focal spinal tenderness, no upper extremity lymphedema Neuro: nonfocal, well oriented, appropriate affect Breasts: The right breast is status post mastectomy and reconstruction.  There is darkening of the skin in the superior aspect.  The breast is approximately 15 to 20% smaller than the left.  The left breast is otherwise unremarkable.  Both axillae are benign.}  LAB RESULTS: Lab Results  Component Value Date   WBC 8.0 07/25/2019   NEUTROABS 4.4 07/25/2019   HGB 13.3 07/25/2019   HCT 40.6  07/25/2019   MCV 96.7 07/25/2019   PLT 255 07/25/2019      Chemistry      Component Value Date/Time   NA 142 07/25/2019 1138   NA 142 05/15/2019 1201   NA 141 05/25/2017 1140   K 4.1 07/25/2019 1138   K 3.3 (L) 05/25/2017 1140   CL 113 (H) 07/25/2019 1138   CL 105 09/27/2012 1044   CO2 21 (L) 07/25/2019 1138   CO2 25 05/25/2017 1140   BUN 8 07/25/2019 1138   BUN 7 05/15/2019 1201   BUN 9.1 05/25/2017 1140   CREATININE 0.67 07/25/2019 1138   CREATININE 0.80 09/13/2017 1322   CREATININE 0.8 05/25/2017 1140      Component Value Date/Time   CALCIUM 8.6 (L) 07/25/2019 1138   CALCIUM 9.3 05/25/2017 1140   ALKPHOS 68 07/25/2019 1138   ALKPHOS 113 05/25/2017 1140   AST 10 (L) 07/25/2019 1138   AST 9 09/13/2017 1322   AST 9 05/25/2017 1140   ALT 12 07/25/2019 1138   ALT 9 09/13/2017 1322   ALT 11 05/25/2017 1140   BILITOT 0.5 07/25/2019 1138   BILITOT 0.4 05/15/2019 1201   BILITOT 0.3 09/13/2017 1322   BILITOT 0.23 05/25/2017 1140      STUDIES: No results found.   ASSESSMENT: 45 y.o.  BRCA1 and BRCA2 negative [place] woman, status post right breast biopsy in March 2011 for a high-grade invasive  ductal carcinoma involving overlapping sites of the right breast, ER positive 3%, PR positive 14%, strongly HER2/neu positive with MIB-1 of 15%. Biopsy proven lymph node involvement at presentation. Changes consistent with inflammatory carcinoma.   1. Treated neoadjuvantly, status post 4 dose-dense cycles of doxorubicin/cyclophosphamide, followed by 12 weekly doses of paclitaxel with trastuzumab completed September 2011.   2. Trastuzumab was then continued for a total of 1 year [to June 2012]. Post-treatment echo showed a well-preserved ejection fraction.   3. Status post right modified radical mastectomy in October 2011 showing a ypT1a ypN1 invasive ductal carcinoma, grade 3.  Status post right reconstruction with implant, March 2015   4. postmastectomy radiation completed in January 2012  5. on tamoxifen beginning January of 2012 stopped somewhere in Spring/Summer 2016  (a) anastrozole started November 2016, discontinued December 2019.  6. other problems include chronic postsurgical pain, chronic right upper extremity lymphedema, and chronic depression  7. Goserelin started 09/24/2014, discontinued November 2019.  8.  Genetics testing   (a) Negative genetic testing on the CustomNext-Cancer panel +RNAinsight.  The CustomNext-Cancer gene panel offered by [facility] and includes sequencing and rearrangement analysis for the following 91 genes: AIP, ALK, APC*, ATM*, AXIN2, BAP1, BARD1, BLM, BMPR1A, BRCA1*, BRCA2*, BRIP1*, CDC73, CDH1*, CDK4, CDKN1B, CDKN2A, CHEK2*, CTNNA1, DICER1, FANCC, FH, FLCN, GALNT12, KIF1B, LZTR1, MAX, MEN1, MET, MLH1*, MRE11A, MSH2*, MSH3, MSH6*, MUTYH*, NBN, NF1*, NF2, NTHL1, PALB2*, PHOX2B, PMS2*, POT1, PRKAR1A, PTCH1, PTEN*, RAD50, RAD51C*, RAD51D*, RB1, RECQL, RET, SDHA, SDHAF2, SDHB, SDHC, SDHD, SMAD4, SMARCA4, SMARCB1, SMARCE1, STK11, SUFU, TMEM127, TP53*, TSC1, TSC2, VHL and XRCC2 (sequencing and deletion/duplication); CASR, CFTR, CPA1, CTRC, EGFR, EGLN1, FAM175A,  HOXB13, KIT, MITF, MLH3, PALLD, PDGFRA, POLD1, POLE, PRSS1, RINT1, RPS20, SPINK1 and TERT (sequencing only); EPCAM and GREM1 (deletion/duplication only). DNA and RNA analyses performed for * genes.  The report date is August 13, 2019.   PLAN: Tressia is now just about 9-1/2 years out from definitive surgery for her breast cancer with no evidence of disease recurrence.  This is very favorable.  She would like to  consider further plastic work and I am referring her back to Dr. Marla Roe to discuss that  Because he is behind on mammography and I have entered an order for her left screening mammogram.  I do not find any significant abnormality though by palpation and inspection in the left breast today.  She has a strong family history for breast and ovarian cancer and I am setting her up for genetics counseling and testing.  Otherwise she will return to see me in 1 year.  She knows to call for any other issue that may develop before the next visit.  Total encounter time 30 minutes.   Magrinat, Virgie Dad, MD  06/25/20 11:23 PM Medical Oncology and Hematology [facility] [place], [place] [address] Tel. [phone]    Fax. [phone]   I, Wilburn Mylar, am acting as scribe for Dr. Virgie Dad. Magrinat.  I, Lurline Del MD, have reviewed the above documentation for accuracy and completeness, and I agree with the above.   *Total Encounter Time as defined by the Centers for Medicare and Medicaid Services includes, in addition to the face-to-face time of a patient visit (documented in the note above) non-face-to-face time: obtaining and reviewing outside history, ordering and reviewing medications, tests or procedures, care coordination (communications with other health care professionals or caregivers) and documentation in the medical record.

## 2020-06-26 ENCOUNTER — Inpatient Hospital Stay: Payer: Medicare Other

## 2020-06-26 ENCOUNTER — Inpatient Hospital Stay: Payer: Medicare Other | Admitting: Oncology

## 2020-06-29 NOTE — Progress Notes (Signed)
[place]  Telephone:([phone]) [phone] Fax:(336) [phone]   ID: Karen Hopkins   DOB: 1976/03/16  MR#: [id number]  HER#:[id number]  Patient Care Team: [name], MD as PCP - General (Pediatrics) Melvenia Beam, MD as Consulting Physician (Neurology) Letta Pate Luanna Salk, MD as Consulting Physician (Physical Medicine and Rehabilitation) Dillingham, Loel Lofty, DO as Attending Physician (Plastic Surgery) Skeet Latch, MD as Attending Physician (Cardiology) Irven Ingalsbe, Virgie Dad, MD as Consulting Physician (Oncology) Montez Morita, MD as Referring Physician (Family Medicine) Neldon Mc, MD as Consulting Physician (General Surgery) Dillingham, Loel Lofty, DO as Attending Physician (Plastic Surgery) Erle Crocker, MD as Consulting Physician (Orthopedic Surgery) Megan Salon, MD as Consulting Physician (Gynecology)   CHIEF COMPLAINT:  Locally advanced right breast cancer (s/p right mastectomy)  CURRENT TREATMENT: Observation   INTERVAL HISTORY: Karen Hopkins was scheduled today for follow-up of her estrogen receptor positive breast cancer.  However she did not show for her visit.  Since her last visit, she underwent genetic testing on 07/11/2019 showing negative results.  She also underwent left breast mastopexy on 08/22/2019 under Dr. Marla Roe.   Her most recent mammography was a left screening mammogram in 10/2016 at [facility].   REVIEW OF SYSTEMS: Karen Hopkins    COVID 19 VACCINATION STATUS:    HISTORY OF PRESENT ILLNESS: From the original intake note:  The patient noted swelling and redness, as well as some tenderness, over her right breast for some time. She thought this might be related to pregnancy, since she delivered her third child about 8 months ago. As the problem did not improve, she brought it to her primary care physician's attention. He treated her with Bactrim, which she says she could not tolerate because of nausea and  vomiting, but which in any case did not resolve the problem, so he set her up for mammography at [facility] 08/26/2009. Dr. Sadie Haber found by exam marked skin thickening and erythema of the right breast extending pretty much throughout the breast. By mammography there were scattered fibroglandular densities, and an asymmetric density in the right upper outer quadrant with suspicious microcalcifications. There were also enlarged right axillary lymph nodes. The left appeared normal. By ultrasound there was an ill defined area of hypoechoic tissue in the right upper outer quadrant that was difficult to measure. There were also abnormal right axillary lymph nodes.  Ultrasound guided biopsy was performed the same day, and showed [phone]) a poorly differentiated invasive ductal carcinoma, which was estrogen receptor poor at 3%, progesterone receptor positive at 14% with an elevated proliferation marker at 50%, but importantly with strong amplification of HER-2 by C[facility] with a ratio of 5.20.  Bilateral breast MRIs were obtained 09/02/2009. This showed an area up to 10.6 cm in the right breast showing confluent mass and non-mass enhancement. There was marked skin thickening involving the entire right breast, and numerous bulky right axillary lymph nodes were identified. The left breast was unremarkable, and there was no evidence of internal mammary lymph node involvement.  Patient was treated in the neoadjuvant setting with 4 dose dense cycles of doxorubicin and cyclophosphamide, followed by 12 weekly doses of paclitaxel and trastuzumab. Karen Hopkins underwent definitive right modified radical mastectomy in October 2011, with a residual 2.5 mm area of tumor in the breast, grade 3, and one of 21 lymph nodes involved.   Her subsequent history is as detailed above.   PAST MEDICAL HISTORY: Past Medical History:  Diagnosis Date  . Anxiety   .  Arthritis   . Cancer ([place]) 08/13/2009   Rt. Br. Ca  . Depression   .  Diabetes mellitus without complication (HCC)    has been "borderline"  . Essential hypertension 11/05/2015  . History of radiation therapy   . Hypertension    under control with med., has been on med. x 2 yr.  . Lymphedema of arm    right; no BP or puncture to right arm  . Personal history of chemotherapy 09/16/2009   rt breast  . Pneumonia   . Seasonal allergies   . Sinus headache     PAST SURGICAL HISTORY: Past Surgical History:  Procedure Laterality Date  . BREAST BIOPSY Right 08/26/2009  . BREAST REDUCTION SURGERY Left 08/23/2013   Procedure: LEFT BREAST REDUCTION  ;  Surgeon: Theodoro Kos, DO;  Location: [facility];  Service: Plastics;  Laterality: Left;  . BREAST REDUCTION WITH MASTOPEXY Left 08/22/2019   Procedure: Left breast mastopexy reduction with liposuction for breast symmetry;  Surgeon: Wallace Going, DO;  Location: [facility];  Service: Plastics;  Laterality: Left;  . CESAREAN SECTION  07/15/2001; 03/18/2004; 11/21/2008  . lateral orbiotomy Left 11/2015   Dr. Toy Cookey at [facility]  . LATISSIMUS FLAP TO BREAST Right 03/12/2013   Procedure: RIGHT BREAST LATISSIMUS FLAP WITH EXPANDER PLACEMENT;  Surgeon: Theodoro Kos, DO;  Location: [facility];  Service: Plastics;  Laterality: Right;  . LIPOSUCTION Bilateral 08/23/2013   Procedure: LIPOSUCTION;  Surgeon: Theodoro Kos, DO;  Location: [facility];  Service: Plastics;  Laterality: Bilateral;  . LIPOSUCTION WITH LIPOFILLING Left 08/22/2019   Procedure: LIPOSUCTION WITH LIPOFILLING;  Surgeon: Wallace Going, DO;  Location: [facility];  Service: Plastics;  Laterality: Left;  Marland Kitchen MASTECTOMY Right 13-Aug-2009  . MODIFIED RADICAL MASTECTOMY Right 03/11/2010  . NASAL SEPTUM SURGERY    . ORIF ANKLE FRACTURE Left 07/18/2018   Procedure: OPEN REDUCTION INTERNAL FIXATION (ORIF) LEFT ANKLE FRACTURE WITH  SYNDESMOSIS AND ANKLE ARTHROTOMY;  Surgeon: Erle Crocker, MD;  Location: [facility];  Service: Orthopedics;  Laterality: Left;  . PORT-A-CATH REMOVAL Left 12/01/2010  . PORTACATH PLACEMENT Left 09/12/2009  . REDUCTION MAMMAPLASTY Left 08/2013  . REMOVAL OF TISSUE EXPANDER AND PLACEMENT OF IMPLANT Right 08/23/2013   Procedure: REMOVAL RIGHT TISSUE EXPANDER AND PLACEMENT OF IMPLANT TO RIGHT BREAST ;  Surgeon: Theodoro Kos, DO;  Location: [facility];  Service: Plastics;  Laterality: Right;  . SUPRA-UMBILICAL HERNIA  [id number]  . TUBAL LIGATION  11/21/2008    FAMILY HISTORY Family History  Problem Relation Age of Onset  . Hypertension Mother   . Breast cancer Mother 40  . Diabetes type II Father   . Prostate cancer Father 53  . Stroke Neg Hx   . Colon polyps Neg Hx   . Esophageal cancer Neg Hx   . Pancreatic cancer Neg Hx   . Stomach cancer Neg Hx   . Rectal cancer Neg Hx   The patient's mother was diagnosed with stage IV breast cancer in 08/13/2016 and passed away in 10-Nov-2017.  Also of the patient's mother's mother's six sisters, two (the patient's great-aunts) had ovarian cancer.   GYNECOLOGIC HISTORY:   (Updated 11/26/2013) The patient is GX, P3. First child was premature. Age at first delivery, 45 years old.   she status post tubal ligation. She continues to have menstrual cycles, although irregularly.   SOCIAL HISTORY: (Updated January 2021) She is disabled. Karen Hopkins's children are [place],  Annalee Genta, and [name]. They are 40, 15 and 10 as of January 2021 they are all boys, all at home. The patient attends a non-denominational church in [place], which she considers her home, but she is currently living in [place].   ADVANCED DIRECTIVES: not in place   HEALTH MAINTENANCE:  Social History   Tobacco Use  . Smoking status: Former Smoker    Packs/day: 0.00    Years: 0.00    Pack years: 0.00    Types: Cigarettes    Start date: 06/07/2002    Quit date: 05/08/2019    Years since quitting: 1.1  . Smokeless tobacco: Never Used  Vaping Use  .  Vaping Use: Never used  Substance Use Topics  . Alcohol use: No  . Drug use: No    Colonoscopy: 01/2019, adenoma (Dr. Tarri Glenn) repeat 2027  PAP: 10/2018, abnormal and HPV+ (Dr. Sabra Heck)  Bone density: Never  Lipid panel: August 2014    Allergies  Allergen Reactions  . Penicillins Swelling    FACIAL SWELLING Did it involve swelling of the face/tongue/throat, SOB, or low BP? Yes Did it involve sudden or severe rash/hives, skin peeling, or any reaction on the inside of your mouth or nose? Unknown Did you need to seek medical attention at a hospital or doctor's office? No When did it last happen?More than 10 years If all above answers are "NO", may proceed with cephalosporin use.   Recardo Evangelist [Pregabalin] Nausea Only    .  Marland Kitchen Meloxicam Nausea Only  . Robaxin [Methocarbamol] Nausea Only    Current Outpatient Medications  Medication Sig Dispense Refill  . albuterol (PROAIR HFA) 108 (90 Base) MCG/ACT inhaler Inhale 2 puffs into the lungs every 6 (six) hours as needed for wheezing or shortness of breath. 18 g 3  . ibuprofen (ADVIL) 600 MG tablet Take 1 tablet (600 mg total) by mouth every 8 (eight) hours as needed. 30 tablet 0  . levonorgestrel (MIRENA) 20 MCG/24HR IUD 1 each by Intrauterine route once.    . potassium chloride (MICRO-K) 10 MEQ CR capsule TAKE 1 CAPSULE BY MOUTH 2 TIMES DAILY. 30 capsule 3  . topiramate (TOPAMAX) 50 MG tablet TAKE 3 TABLETS BY MOUTH AT BEDTIME 90 tablet 0   No current facility-administered medications for this visit.    OBJECTIVE:   There were no vitals filed for this visit.   There is no height or weight on file to calculate BMI.    ECOG FS: 2  There were no vitals filed for this visit.    LAB RESULTS: Lab Results  Component Value Date   WBC 8.0 07/25/2019   NEUTROABS 4.4 07/25/2019   HGB 13.3 07/25/2019   HCT 40.6 07/25/2019   MCV 96.7 07/25/2019   PLT 255 07/25/2019      Chemistry      Component Value Date/Time   NA 142 07/25/2019  1138   NA 142 05/15/2019 1201   NA 141 05/25/2017 1140   K 4.1 07/25/2019 1138   K 3.3 (L) 05/25/2017 1140   CL 113 (H) 07/25/2019 1138   CL 105 09/27/2012 1044   CO2 21 (L) 07/25/2019 1138   CO2 25 05/25/2017 1140   BUN 8 07/25/2019 1138   BUN 7 05/15/2019 1201   BUN 9.1 05/25/2017 1140   CREATININE 0.67 07/25/2019 1138   CREATININE 0.80 09/13/2017 1322   CREATININE 0.8 05/25/2017 1140      Component Value Date/Time   CALCIUM 8.6 (L) 07/25/2019 1138  CALCIUM 9.3 05/25/2017 1140   ALKPHOS 68 07/25/2019 1138   ALKPHOS 113 05/25/2017 1140   AST 10 (L) 07/25/2019 1138   AST 9 09/13/2017 1322   AST 9 05/25/2017 1140   ALT 12 07/25/2019 1138   ALT 9 09/13/2017 1322   ALT 11 05/25/2017 1140   BILITOT 0.5 07/25/2019 1138   BILITOT 0.4 05/15/2019 1201   BILITOT 0.3 09/13/2017 1322   BILITOT 0.23 05/25/2017 1140     STUDIES: No results found.   ASSESSMENT: 45 y.o.  BRCA1 and BRCA2 negative Karen Hopkins, status post right breast biopsy in March 2011 for a high-grade invasive ductal carcinoma involving overlapping sites of the right breast, ER positive 3%, PR positive 14%, strongly HER2/neu positive with MIB-1 of 15%. Biopsy proven lymph node involvement at presentation. Changes consistent with inflammatory carcinoma.   1. Treated neoadjuvantly, status post 4 dose-dense cycles of doxorubicin/cyclophosphamide, followed by 12 weekly doses of paclitaxel with trastuzumab completed September 2011.   2. Trastuzumab was then continued for a total of 1 year [to June 2012]. Post-treatment echo showed a well-preserved ejection fraction.   3. Status post right modified radical mastectomy in October 2011 showing a ypT1a ypN1 invasive ductal carcinoma, grade 3.  Status post right reconstruction with implant, March 2015   4. postmastectomy radiation completed in January 2012  5. on tamoxifen beginning January of 2012 stopped somewhere in Spring/Summer 2016  (a) anastrozole started  November 2016, discontinued December 2019.  6. other problems include chronic postsurgical pain, chronic right upper extremity lymphedema, and chronic depression  7. Goserelin started 09/24/2014, discontinued November 2019.  8.  Genetics testing  (a) Negative genetic testing on the CustomNext-Cancer panel +RNAinsight.  The CustomNext-Cancer gene panel offered by [facility] and includes sequencing and rearrangement analysis for the following 91 genes: AIP, ALK, APC*, ATM*, AXIN2, BAP1, BARD1, BLM, BMPR1A, BRCA1*, BRCA2*, BRIP1*, CDC73, CDH1*, CDK4, CDKN1B, CDKN2A, CHEK2*, CTNNA1, DICER1, FANCC, FH, FLCN, GALNT12, KIF1B, LZTR1, MAX, MEN1, MET, MLH1*, MRE11A, MSH2*, MSH3, MSH6*, MUTYH*, NBN, NF1*, NF2, NTHL1, PALB2*, PHOX2B, PMS2*, POT1, PRKAR1A, PTCH1, PTEN*, RAD50, RAD51C*, RAD51D*, RB1, RECQL, RET, SDHA, SDHAF2, SDHB, SDHC, SDHD, SMAD4, SMARCA4, SMARCB1, SMARCE1, STK11, SUFU, TMEM127, TP53*, TSC1, TSC2, VHL and XRCC2 (sequencing and deletion/duplication); CASR, CFTR, CPA1, CTRC, EGFR, EGLN1, FAM175A, HOXB13, KIT, MITF, MLH3, PALLD, PDGFRA, POLD1, POLE, PRSS1, RINT1, RPS20, SPINK1 and TERT (sequencing only); EPCAM and GREM1 (deletion/duplication only). DNA and RNA analyses performed for * genes.  The report date is August 13, 2019.   PLAN: Karen Hopkins did not show for her 06/30/2020 visit.  Follow-up letter has been sent   Karen Hopkins, Virgie Dad, MD  06/30/20 6:16 PM Medical Oncology and Hematology [facility], [place] [address] Tel. [phone]    Fax. [phone]   I, Wilburn Mylar, am acting as scribe for Dr. Virgie Dad. Tori Dattilio.  I, Lurline Del MD, have reviewed the above documentation for accuracy and completeness, and I agree with the above.   *Total Encounter Time as defined by the Centers for Medicare and Medicaid Services includes, in addition to the face-to-face time of a patient visit (documented in the note above) non-face-to-face time:  obtaining and reviewing outside history, ordering and reviewing medications, tests or procedures, care coordination (communications with other health care professionals or caregivers) and documentation in the medical record.

## 2020-06-30 ENCOUNTER — Encounter: Payer: Self-pay | Admitting: Oncology

## 2020-06-30 ENCOUNTER — Inpatient Hospital Stay: Payer: Medicare Other | Attending: Genetic Counselor

## 2020-06-30 ENCOUNTER — Inpatient Hospital Stay (HOSPITAL_BASED_OUTPATIENT_CLINIC_OR_DEPARTMENT_OTHER): Payer: Medicare Other | Admitting: Oncology

## 2020-06-30 DIAGNOSIS — Z170 Estrogen receptor positive status [ER+]: Secondary | ICD-10-CM

## 2020-06-30 DIAGNOSIS — C50811 Malignant neoplasm of overlapping sites of right female breast: Secondary | ICD-10-CM

## 2021-05-05 NOTE — Progress Notes (Signed)
Karen Hopkins  [phone] Fax:([phone]   ID: Karen Hopkins   DOB: 1975-11-29  MR#: [id number]  JYN#:[id number]  Patient Care Team: [name], MD as PCP - General (Pediatrics) Melvenia Beam, MD as Consulting Physician (Neurology) Letta Pate Luanna Salk, MD as Consulting Physician (Physical Medicine and Rehabilitation) Dillingham, Loel Lofty, DO as Attending Physician (Plastic Surgery) Skeet Latch, MD as Attending Physician (Cardiology) Tonique Mendonca, Virgie Dad, MD as Consulting Physician (Oncology) Montez Morita, MD as Referring Physician (Family Medicine) Neldon Mc, MD as Consulting Physician (General Surgery) Dillingham, Loel Lofty, DO as Attending Physician (Plastic Surgery) Erle Crocker, MD as Consulting Physician (Orthopedic Surgery) Megan Salon, MD as Consulting Physician (Gynecology)   CHIEF COMPLAINT:  Locally advanced right breast cancer (s/p right mastectomy)  CURRENT TREATMENT: Observation   INTERVAL HISTORY: Karen Hopkins returns today for follow-up of her estrogen receptor positive breast cancer.  She continues under observation.  Since her last visit, she underwent genetic testing on 07/11/2019 showing negative results.  She also underwent left breast mastopexy on 08/22/2019 under Dr. Marla Roe.  She says she is still not satisfied, it does not look right, although to me it looks significantly improved  Her most recent left mammography was in 10/2016 at [facility].  She tells me she is scared of getting mammography but is willing to undergo it at this point   REVIEW OF SYSTEMS: Karen Hopkins is not exercising regularly.  She feels her right arm has swollen some and she wonders if the surgery there has contributed.  She has numbness in both hands, mostly the fingers, but it is very and constant.  It is worse in the morning and then sometimes when she drives.  Other times she does not have it at all a detailed review of  systems is otherwise stable.   COVID 19 VACCINATION STATUS: Refuses vaccination   HISTORY OF PRESENT ILLNESS: From the original intake note:  The patient noted swelling and redness, as well as some tenderness, over her right breast for some time. She thought this might be related to pregnancy, since she delivered her third child about 8 months ago. As the problem did not improve, she brought it to her primary care physician's attention. He treated her with Bactrim, which she says she could not tolerate because of nausea and vomiting, but which in any case did not resolve the problem, so he set her up for mammography at [facility] 08/26/2009. Dr. Sadie Haber found by exam marked skin thickening and erythema of the right breast extending pretty much throughout the breast. By mammography there were scattered fibroglandular densities, and an asymmetric density in the right upper outer quadrant with suspicious microcalcifications. There were also enlarged right axillary lymph nodes. The left appeared normal. By ultrasound there was an ill defined area of hypoechoic tissue in the right upper outer quadrant that was difficult to measure. There were also abnormal right axillary lymph nodes.  Ultrasound guided biopsy was performed the same day, and showed [phone]) a poorly differentiated invasive ductal carcinoma, which was estrogen receptor poor at 3%, progesterone receptor positive at 14% with an elevated proliferation marker at 50%, but importantly with strong amplification of HER-2 by CISH with a ratio of 5.20.  Bilateral breast MRIs were obtained 09/02/2009. This showed an area up to 10.6 cm in the right breast showing confluent mass and non-mass enhancement. There was marked skin thickening involving the entire right breast, and numerous bulky right axillary lymph nodes  were identified. The left breast was unremarkable, and there was no evidence of internal mammary lymph node involvement.  Patient  was treated in the neoadjuvant setting with 4 dose dense cycles of doxorubicin and cyclophosphamide, followed by 12 weekly doses of paclitaxel and trastuzumab. Karen Hopkins underwent definitive right modified radical mastectomy in October 2011, with a residual 2.5 mm area of tumor in the breast, grade 3, and one of 21 lymph nodes involved.   Her subsequent history is as detailed above.   PAST MEDICAL HISTORY: Past Medical History:  Diagnosis Date   Anxiety    Arthritis    Cancer ([place]) 2011   Rt. Br. Ca   Depression    Diabetes mellitus without complication ([place])    has been "borderline"   Essential hypertension 11/05/2015   History of radiation therapy    Hypertension    under control with med., has been on med. x 2 yr.   Lymphedema of arm    right; no BP or puncture to right arm   Personal history of chemotherapy 09/16/2009   rt breast   Pneumonia    Seasonal allergies    Sinus headache     PAST SURGICAL HISTORY: Past Surgical History:  Procedure Laterality Date   BREAST BIOPSY Right 08/26/2009   BREAST REDUCTION SURGERY Left 08/23/2013   Procedure: LEFT BREAST REDUCTION  ;  Surgeon: Theodoro Kos, DO;  Location: [place];  Service: Plastics;  Laterality: Left;   BREAST REDUCTION WITH MASTOPEXY Left 08/22/2019   Procedure: Left breast mastopexy reduction with liposuction for breast symmetry;  Surgeon: Wallace Going, DO;  Location: [place];  Service: Plastics;  Laterality: Left;   CESAREAN SECTION  07/15/2001; 03/18/2004; 11/21/2008   lateral orbiotomy Left 11/2015   Dr. Toy Cookey at [place] Right 03/12/2013   Procedure: RIGHT BREAST LATISSIMUS FLAP WITH EXPANDER PLACEMENT;  Surgeon: Theodoro Kos, DO;  Location: [place];  Service: Plastics;  Laterality: Right;   LIPOSUCTION Bilateral 08/23/2013   Procedure: LIPOSUCTION;  Surgeon: Theodoro Kos, DO;  Location: [place];  Service: Plastics;  Laterality:  Bilateral;   LIPOSUCTION WITH LIPOFILLING Left 08/22/2019   Procedure: LIPOSUCTION WITH LIPOFILLING;  Surgeon: Wallace Going, DO;  Location: [place];  Service: Plastics;  Laterality: Left;   MASTECTOMY Right 2011   MODIFIED RADICAL MASTECTOMY Right 03/11/2010   NASAL SEPTUM SURGERY     ORIF ANKLE FRACTURE Left 07/18/2018   Procedure: OPEN REDUCTION INTERNAL FIXATION (ORIF) LEFT ANKLE FRACTURE WITH  SYNDESMOSIS AND ANKLE ARTHROTOMY;  Surgeon: Erle Crocker, MD;  Location: [place];  Service: Orthopedics;  Laterality: Left;   PORT-A-CATH REMOVAL Left 12/01/2010   PORTACATH PLACEMENT Left 09/12/2009   REDUCTION MAMMAPLASTY Left 08/2013   REMOVAL OF TISSUE EXPANDER AND PLACEMENT OF IMPLANT Right 08/23/2013   Procedure: REMOVAL RIGHT TISSUE EXPANDER AND PLACEMENT OF IMPLANT TO RIGHT BREAST ;  Surgeon: Theodoro Kos, DO;  Location: [place];  Service: Plastics;  Laterality: Right;   SUPRA-UMBILICAL HERNIA  [id number]   TUBAL LIGATION  11/21/2008    FAMILY HISTORY Family History  Problem Relation Age of Onset   Hypertension Mother    Breast cancer Mother 4   Diabetes type II Father    Prostate cancer Father 87   Stroke Neg Hx    Colon polyps Neg Hx    Esophageal cancer Neg Hx    Pancreatic cancer Neg Hx  Stomach cancer Neg Hx    Rectal cancer Neg Hx   The patient's mother was diagnosed with stage IV breast cancer in 07/28/16 and passed away in 10/25/17.  Also of the patient's mother's mother's six sisters, two (the patient's great-aunts) had ovarian cancer.   GYNECOLOGIC HISTORY:   (Updated 11/26/2013) The patient is GX, P3. First child was premature. Age at first delivery, 45 years old.   she status post tubal ligation. She continues to have menstrual cycles, although irregularly.   SOCIAL HISTORY: (Updated November 2020) She is disabled. Karen Hopkins's children are [name], and [name]. They are 71, 17, and 12 as of November 2022.   They are all boys.  The 43 year old is studying business and psychology at [employer].  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  The patient attends a non-denominational church in [place], which she considers her home, but she is currently living in [place].   ADVANCED DIRECTIVES: not in place   HEALTH MAINTENANCE:  Social History   Tobacco Use   Smoking status: Former    Packs/day: 0.00    Years: 0.00    Pack years: 0.00    Types: Cigarettes    Start date: 06/07/2002    Quit date: 05/08/2019    Years since quitting: 1.9   Smokeless tobacco: Never  Vaping Use   Vaping Use: Never used  Substance Use Topics   Alcohol use: No   Drug use: No    Colonoscopy: 01/2019, adenoma (Dr. Tarri Glenn) repeat 28-Jul-2025  PAP: 10/26/2018, abnormal and HPV+ (Dr. Sabra Heck)  Bone density: Never  Lipid panel: August 2014    Allergies  Allergen Reactions   Penicillins Swelling    FACIAL SWELLING Did it involve swelling of the face/tongue/throat, SOB, or low BP? Yes Did it involve sudden or severe rash/hives, skin peeling, or any reaction on the inside of your mouth or nose? Unknown Did you need to seek medical attention at a hospital or doctor's office? No When did it last happen? More than 10 years If all above answers are "NO", may proceed with cephalosporin use.    Lyrica [Pregabalin] Nausea Only    .   Meloxicam Nausea Only   Robaxin [Methocarbamol] Nausea Only    Current Outpatient Medications  Medication Sig Dispense Refill   albuterol (PROAIR HFA) 108 (90 Base) MCG/ACT inhaler Inhale 2 puffs into the lungs every 6 (six) hours as needed for wheezing or shortness of breath. 18 g 3   ibuprofen (ADVIL) 600 MG tablet Take 1 tablet (600 mg total) by mouth every 8 (eight) hours as needed. 30 tablet 0   levonorgestrel (MIRENA) 20 MCG/24HR IUD 1 each by Intrauterine route once.     potassium chloride (MICRO-K) 10 MEQ CR capsule TAKE 1 CAPSULE BY MOUTH 2 TIMES DAILY. 30 capsule 3    topiramate (TOPAMAX) 50 MG tablet TAKE 3 TABLETS BY MOUTH AT BEDTIME 90 tablet 0   No current facility-administered medications for this visit.    OBJECTIVE: African-American woman in no acute distress  Vitals:   05/06/21 1154  BP: 126/82  Pulse: 82  Resp: 18  Temp: 97.7 F (36.5 C)  SpO2: 100%     Body mass index is 34.21 kg/m.    ECOG FS: 2  Filed Weights   05/06/21 1154  Weight: 199 lb 4.8 oz (90.4 kg)    Sclerae unicteric, EOMs intact Wearing a mask No cervical or supraclavicular adenopathy Lungs no rales or rhonchi Heart regular rate and  rhythm Abd soft, nontender, positive bowel sounds MSK no focal spinal tenderness, no upper extremity lymphedema Neuro: nonfocal, well oriented, appropriate affect Breasts: The right breast is status postmastectomy and reconstruction, with recent mastopexy.  There is no evidence of local recurrence.  The right breast is status post fat grafting.  Overall the appearance is symmetrical.  There is no evidence of local recurrence.  Both axillae are benign   LAB RESULTS: Lab Results  Component Value Date   WBC 9.2 05/06/2021   NEUTROABS 5.2 05/06/2021   HGB 13.3 05/06/2021   HCT 39.5 05/06/2021   MCV 92.9 05/06/2021   PLT 254 05/06/2021      Chemistry      Component Value Date/Time   NA 142 07/25/2019 1138   NA 142 05/15/2019 1201   NA 141 05/25/2017 1140   K 4.1 07/25/2019 1138   K 3.3 (L) 05/25/2017 1140   CL 113 (H) 07/25/2019 1138   CL 105 09/27/2012 1044   CO2 21 (L) 07/25/2019 1138   CO2 25 05/25/2017 1140   BUN 8 07/25/2019 1138   BUN 7 05/15/2019 1201   BUN 9.1 05/25/2017 1140   CREATININE 0.67 07/25/2019 1138   CREATININE 0.80 09/13/2017 1322   CREATININE 0.8 05/25/2017 1140      Component Value Date/Time   CALCIUM 8.6 (L) 07/25/2019 1138   CALCIUM 9.3 05/25/2017 1140   ALKPHOS 68 07/25/2019 1138   ALKPHOS 113 05/25/2017 1140   AST 10 (L) 07/25/2019 1138   AST 9 09/13/2017 1322   AST 9 05/25/2017 1140    ALT 12 07/25/2019 1138   ALT 9 09/13/2017 1322   ALT 11 05/25/2017 1140   BILITOT 0.5 07/25/2019 1138   BILITOT 0.4 05/15/2019 1201   BILITOT 0.3 09/13/2017 1322   BILITOT 0.23 05/25/2017 1140      STUDIES: No results found.   ASSESSMENT: 45 y.o.  BRCA1 and BRCA2 negative [place] woman, status post right breast biopsy in March 2011 for a high-grade invasive ductal carcinoma involving overlapping sites of the right breast, ER positive 3%, PR positive 14%, strongly HER2/neu positive with MIB-1 of 15%. Biopsy proven lymph node involvement at presentation. Changes consistent with inflammatory carcinoma.   1. Treated neoadjuvantly, status post 4 dose-dense cycles of doxorubicin/cyclophosphamide, followed by 12 weekly doses of paclitaxel with trastuzumab completed September 2011.   2. Trastuzumab was then continued for a total of 1 year [to June 2012]. Post-treatment echo showed a well-preserved ejection fraction.   3. Status post right modified radical mastectomy in October 2011 showing a ypT1a ypN1 invasive ductal carcinoma, grade 3.  Status post right reconstruction with implant, March 2015   4. postmastectomy radiation completed in January 2012  5. on tamoxifen beginning January of 2012 stopped somewhere in Spring/Summer 2016  (a) anastrozole started November 2016, discontinued December 2019.  6. other problems include chronic postsurgical pain, chronic right upper extremity lymphedema, and chronic depression  7. Goserelin started 09/24/2014, discontinued November 2019.  8.  Genetics testing 08/13/2019 through the CustomNext-Cancer panel +RNAinsight offered by [employer] found no deleterious mutations in AIP, ALK, APC*, ATM*, AXIN2, BAP1, BARD1, BLM, BMPR1A, BRCA1*, BRCA2*, BRIP1*, CDC73, CDH1*, CDK4, CDKN1B, CDKN2A, CHEK2*, CTNNA1, DICER1, FANCC, FH, FLCN, GALNT12, KIF1B, LZTR1, MAX, MEN1, MET, MLH1*, MRE11A, MSH2*, MSH3, MSH6*, MUTYH*, NBN, NF1*, NF2, NTHL1, PALB2*, PHOX2B,  PMS2*, POT1, PRKAR1A, PTCH1, PTEN*, RAD50, RAD51C*, RAD51D*, RB1, RECQL, RET, SDHA, SDHAF2, SDHB, SDHC, SDHD, SMAD4, SMARCA4, SMARCB1, SMARCE1, STK11, SUFU, TMEM127, TP53*, TSC1, TSC2, VHL  and XRCC2 (sequencing and deletion/duplication); CASR, CFTR, CPA1, CTRC, EGFR, EGLN1, FAM175A, HOXB13, KIT, MITF, MLH3, PALLD, PDGFRA, POLD1, POLE, PRSS1, RINT1, RPS20, SPINK1 and TERT (sequencing only); EPCAM and GREM1 (deletion/duplication only). DNA and RNA analyses performed for * genes.  The report date is August 13, 2019.   PLAN: Karen Hopkins is now 11 years out from definitive surgery for her breast cancer with no evidence of disease recurrence.  This is very favorable.  The numbness she is experiencing in her hands which is intermittent and more in the morning and when she drives not in between is very likely to be due to carpal tunnel.  We discussed that today.  I do not think she needs any particular treatment at present but if she wishes she can use a wrist splint to relieve the symptoms particularly overnight.  She is very anxious.  That is the reason she has not had mammography for 2 years and the reason she has not had COVID vaccines.  We discussed all that.  My recommendation to her is that she exercise to the point of sweating.  That significantly can relieve anxiety.  On the other hand her children are doing great and that is a Hydrographic surveyor.  Her mother died 3 years and she is still grieving appropriately for her  She will have a left screening mammography in the next week or 2 and she will return to see [place] in a year.  She knows to call for any other issue that may develop before then  Total encounter time 20 minutes.*   Clearence Vitug, Virgie Dad, MD  05/06/21 11:58 AM Medical Oncology and Hematology [facility] [place], [place] [address] Tel. [phone]    Fax. [phone]   I, Wilburn Mylar, am acting as scribe for Dr. Virgie Dad. Karen Hopkins.  I, Lurline Del  MD, have reviewed the above documentation for accuracy and completeness, and I agree with the above.   *Total Encounter Time as defined by the Centers for Medicare and Medicaid Services includes, in addition to the face-to-face time of a patient visit (documented in the note above) non-face-to-face time: obtaining and reviewing outside history, ordering and reviewing medications, tests or procedures, care coordination (communications with other health care professionals or caregivers) and documentation in the medical record.

## 2021-05-06 ENCOUNTER — Inpatient Hospital Stay: Payer: Medicare Other | Attending: Genetic Counselor

## 2021-05-06 ENCOUNTER — Inpatient Hospital Stay (HOSPITAL_BASED_OUTPATIENT_CLINIC_OR_DEPARTMENT_OTHER): Payer: Medicare Other | Admitting: Oncology

## 2021-05-06 ENCOUNTER — Other Ambulatory Visit: Payer: Self-pay

## 2021-05-06 VITALS — BP 126/82 | HR 82 | Temp 97.7°F | Resp 18 | Ht 64.0 in | Wt 199.3 lb

## 2021-05-06 DIAGNOSIS — R200 Anesthesia of skin: Secondary | ICD-10-CM | POA: Diagnosis not present

## 2021-05-06 DIAGNOSIS — C50811 Malignant neoplasm of overlapping sites of right female breast: Secondary | ICD-10-CM | POA: Insufficient documentation

## 2021-05-06 DIAGNOSIS — F419 Anxiety disorder, unspecified: Secondary | ICD-10-CM | POA: Insufficient documentation

## 2021-05-06 DIAGNOSIS — Z923 Personal history of irradiation: Secondary | ICD-10-CM | POA: Diagnosis not present

## 2021-05-06 DIAGNOSIS — Z170 Estrogen receptor positive status [ER+]: Secondary | ICD-10-CM

## 2021-05-06 LAB — CBC WITH DIFFERENTIAL (CANCER CENTER ONLY)
Abs Immature Granulocytes: 0.02 10*3/uL (ref 0.00–0.07)
Basophils Absolute: 0 10*3/uL (ref 0.0–0.1)
Basophils Relative: 0 %
Eosinophils Absolute: 0.5 10*3/uL (ref 0.0–0.5)
Eosinophils Relative: 5 %
HCT: 39.5 % (ref 36.0–46.0)
Hemoglobin: 13.3 g/dL (ref 12.0–15.0)
Immature Granulocytes: 0 %
Lymphocytes Relative: 33 %
Lymphs Abs: 3 10*3/uL (ref 0.7–4.0)
MCH: 31.3 pg (ref 26.0–34.0)
MCHC: 33.7 g/dL (ref 30.0–36.0)
MCV: 92.9 fL (ref 80.0–100.0)
Monocytes Absolute: 0.5 10*3/uL (ref 0.1–1.0)
Monocytes Relative: 5 %
Neutro Abs: 5.2 10*3/uL (ref 1.7–7.7)
Neutrophils Relative %: 57 %
Platelet Count: 254 10*3/uL (ref 150–400)
RBC: 4.25 MIL/uL (ref 3.87–5.11)
RDW: 12.4 % (ref 11.5–15.5)
WBC Count: 9.2 10*3/uL (ref 4.0–10.5)
nRBC: 0 % (ref 0.0–0.2)

## 2021-05-06 LAB — CMP (CANCER CENTER ONLY)
ALT: 13 U/L (ref 0–44)
AST: 15 U/L (ref 15–41)
Albumin: 4.1 g/dL (ref 3.5–5.0)
Alkaline Phosphatase: 63 U/L (ref 38–126)
Anion gap: 7 (ref 5–15)
BUN: 8 mg/dL (ref 6–20)
CO2: 23 mmol/L (ref 22–32)
Calcium: 8.9 mg/dL (ref 8.9–10.3)
Chloride: 108 mmol/L (ref 98–111)
Creatinine: 0.63 mg/dL (ref 0.44–1.00)
GFR, Estimated: >60 mL/min (ref >60)
Glucose, Bld: 98 mg/dL (ref 70–99)
Potassium: 3.5 mmol/L (ref 3.5–5.1)
Sodium: 138 mmol/L (ref 135–145)
Total Bilirubin: 0.4 mg/dL (ref 0.3–1.2)
Total Protein: 7.5 g/dL (ref 6.5–8.1)

## 2021-06-23 ENCOUNTER — Ambulatory Visit: Payer: Medicare Other

## 2021-09-14 DIAGNOSIS — R519 Headache, unspecified: Secondary | ICD-10-CM | POA: Diagnosis not present

## 2021-10-01 ENCOUNTER — Encounter: Payer: Self-pay | Admitting: Family Medicine

## 2021-10-01 ENCOUNTER — Ambulatory Visit (INDEPENDENT_AMBULATORY_CARE_PROVIDER_SITE_OTHER): Payer: Medicare Other | Admitting: Family Medicine

## 2021-10-01 VITALS — BP 134/80 | HR 102 | Ht 64.0 in | Wt 219.6 lb

## 2021-10-01 DIAGNOSIS — M7918 Myalgia, other site: Secondary | ICD-10-CM

## 2021-10-01 DIAGNOSIS — G5603 Carpal tunnel syndrome, bilateral upper limbs: Secondary | ICD-10-CM | POA: Diagnosis not present

## 2021-10-01 DIAGNOSIS — I890 Lymphedema, not elsewhere classified: Secondary | ICD-10-CM

## 2021-10-01 DIAGNOSIS — E669 Obesity, unspecified: Secondary | ICD-10-CM

## 2021-10-01 LAB — POCT GLYCOSYLATED HEMOGLOBIN (HGB A1C): Hemoglobin A1C: 5.7 % — AB (ref 4.0–5.6)

## 2021-10-01 MED ORDER — WRIST SPLINT/COCK-UP/LEFT L MISC: 0 refills | Status: DC

## 2021-10-01 MED ORDER — TETANUS-DIPHTH-ACELL PERTUSSIS 5-2.5-18.5 LF-MCG/0.5 IM SUSY: 0.5000 mL | PREFILLED_SYRINGE | Freq: Once | INTRAMUSCULAR | 0 refills | Status: AC

## 2021-10-01 MED ORDER — WRIST SPLINT/COCK-UP/RIGHT L MISC: 0 refills | Status: DC

## 2021-10-01 NOTE — Progress Notes (Signed)
?  Date of Visit: 10/01/2021  ? ?SUBJECTIVE:  ? ?HPI: ? ?Karen Hopkins presents today for follow up. Her last visit here was 05/15/19, and last visit with me prior to that was 09/21/16.  ? ?Numbness in fingers - feeling numbness and tingling in bilateral hands on the first 3 digits. Worse at night. Not getting worse but also not getting better. Ongoing for 1 year. Also worse when she is driving. Has not tried anything specific for it. ? ?Spasms in R chest wall - having muscle type spasms in R chest wall near prior mastectomy site.  ? ?Needs new sleeve for her R arm after mastectomy. Does not know where to obtain this. ? ?Last time I saw her she was losing weight without trying to. Now she has gained weight which is distressing to her. ? ?OBJECTIVE:  ? ?BP 134/80   Pulse (!) 102   Ht '5\' 4"'$  (1.626 m)   Wt 219 lb 9.6 oz (99.6 kg)   SpO2 99%   BMI 37.69 kg/m?  ?Gen: no acute distress, pleasant ,cooperative ?HEENT: normocephalic, atraumatic  ?Heart: regular rate and rhythm, no murmru ?Lungs: normal work of breathing  ?Neuro: alert, speech normal, grossly nonfocal ?Ext: no thenar atrophy. Grip 5/5 bilaterally. Negative tinels, pos phalens bilaterally ?Chest wall: no abnormalities noted over lateral R chest, s/p mastectomy with reconstruction ? ?ASSESSMENT/PLAN:  ? ?Health maintenance:  ?-Tdap today ?-encouraged COVID booster, wants to hold off for now ? ?Pain at prior mastectomy site ?Muscle-type pain. In past was treated with botox injections though not in this area. Will refer to pain management. Patient desires to see new physician there. Will ask tha tshe see Dr. Dagoberto Ligas ? ?Weight gain ?Check TSH along with labs ? ?Lymphedema of right arm ?Encouraged to speak with oncology office about best place to obtain compression sleeve for her arm ? ? ?Myofascial muscle pain ?Refer back to pain management as she has not been seen there in some time, hopefully some form of injection/other therapy will help with her R sided chest wall  muscle pain. ? ?Carpal tunnel syndrome ?History and exam consistent with carpal tunnel syndrome ?Will initiate trial of cockup splints at night ?Patient agreeable ?Follow up in 4-6 weeks to eval for improvement ? ?FOLLOW UP: ?Follow up in 4 weeks as scheduled for above issues ?Referring to pain management ? ?[place]. Ardelia Mems, MD ?[facility] ?

## 2021-10-01 NOTE — Patient Instructions (Addendum)
It was great to see you again today! ? ?Use splints at night, can also use during day if needed ? ?Consider COVID vaccine - recommend you get this. ? ?Referring to pain management ? ?Call oncologist office for appointment - also ask about where to get the compression sleeve for your arm ? ?Tetanus shot today ?Labs today ? ?Follow up with me in 1 month as scheduled ? ?Be well, ?Dr. Ardelia Mems  ?

## 2021-10-02 LAB — LIPID PANEL
Chol/HDL Ratio: 3.5 ratio (ref 0.0–4.4)
Cholesterol, Total: 95 mg/dL — ABNORMAL LOW (ref 100–199)
HDL: 27 mg/dL — ABNORMAL LOW (ref >39)
LDL Chol Calc (NIH): 57 mg/dL (ref 0–99)
Triglycerides: 44 mg/dL (ref 0–149)
VLDL Cholesterol Cal: 11 mg/dL (ref 5–40)

## 2021-10-02 LAB — BASIC METABOLIC PANEL
BUN/Creatinine Ratio: 14 (ref 9–23)
BUN: 8 mg/dL (ref 6–24)
CO2: 22 mmol/L (ref 20–29)
Calcium: 9.5 mg/dL (ref 8.7–10.2)
Chloride: 102 mmol/L (ref 96–106)
Creatinine, Ser: 0.56 mg/dL — ABNORMAL LOW (ref 0.57–1.00)
Glucose: 88 mg/dL (ref 70–99)
Potassium: 4.4 mmol/L (ref 3.5–5.2)
Sodium: 139 mmol/L (ref 134–144)
eGFR: 116 mL/min/{1.73_m2} (ref >59)

## 2021-10-02 LAB — TSH: TSH: 1.05 u[IU]/mL (ref 0.450–4.500)

## 2021-10-07 DIAGNOSIS — G5600 Carpal tunnel syndrome, unspecified upper limb: Secondary | ICD-10-CM | POA: Insufficient documentation

## 2021-10-07 NOTE — Assessment & Plan Note (Signed)
History and exam consistent with carpal tunnel syndrome ?Will initiate trial of cockup splints at night ?Patient agreeable ?Follow up in 4-6 weeks to eval for improvement ?

## 2021-10-07 NOTE — Assessment & Plan Note (Signed)
Refer back to pain management as she has not been seen there in some time, hopefully some form of injection/other therapy will help with her R sided chest wall muscle pain. ?

## 2021-10-07 NOTE — Assessment & Plan Note (Signed)
Encouraged to speak with oncology office about best place to obtain compression sleeve for her arm ? ?

## 2021-10-27 ENCOUNTER — Ambulatory Visit: Payer: Medicare Other | Admitting: Family Medicine

## 2021-11-10 ENCOUNTER — Encounter: Payer: Self-pay | Admitting: *Deleted

## 2021-11-16 ENCOUNTER — Ambulatory Visit (INDEPENDENT_AMBULATORY_CARE_PROVIDER_SITE_OTHER): Payer: Medicare Other | Admitting: Family Medicine

## 2021-11-16 ENCOUNTER — Encounter: Payer: Self-pay | Admitting: Family Medicine

## 2021-11-16 VITALS — BP 116/83 | HR 97 | Wt 224.4 lb

## 2021-11-16 DIAGNOSIS — M25562 Pain in left knee: Secondary | ICD-10-CM

## 2021-11-16 DIAGNOSIS — G8929 Other chronic pain: Secondary | ICD-10-CM

## 2021-11-16 DIAGNOSIS — G5603 Carpal tunnel syndrome, bilateral upper limbs: Secondary | ICD-10-CM | POA: Diagnosis not present

## 2021-11-16 MED ORDER — WRIST SPLINT/COCK-UP/RIGHT L MISC: 0 refills | Status: DC

## 2021-11-16 MED ORDER — WRIST SPLINT/COCK-UP/LEFT L MISC: 0 refills | Status: DC

## 2021-11-16 NOTE — Assessment & Plan Note (Signed)
Patient continues to report pain, largely unchanged from prior visit. She has not yet reestablished with pain management, and is requesting to see Sabino Niemann, PA, at [facility]. - Referral placed to Sabino Niemann for management of pain

## 2021-11-16 NOTE — Assessment & Plan Note (Signed)
Patient continues to have symptoms bilaterally. She has not yet filled prescription for wrist splints due to misplacing prescription. - Repeat prescription provided for bilateral wrist splints

## 2021-11-16 NOTE — Progress Notes (Signed)
SUBJECTIVE:   CHIEF COMPLAINT / HPI:   Karen Hopkins is a 46 y.o. F who presents for follow up. Her main concerns today are recent fall with L knee injury; referral to pain clinic; continued carpal tunnel symptoms, weight gain.  L Knee Pain Patient had a fall about a week ago after slipping while running from [place] where she landed on her L knee and fell into a split position; she denies LoC, head injury at this time. She now reports her L knee and lateral upper L thigh are still swollen + painful, and she reports difficulty walking. She is able to bear weight on her leg.   Chronic pain Has not heard back from pain management clinic. Symptoms continued from prior visit; not sure if worse. She is wondering about a referral to Sabino Niemann, PA, at [facility] (sister goes here).  Carpal tunnel syndrome She has not started using the wrist splints; misplaced prescription. Her symptoms of bilateral tingling and numbness in her hand and fingers are largely unchanged from last OV, though she believes they might be a bit better.  Weight Gain She previously experienced a large weight loss in 2020, and has since regained this weight, which is a concern to her. She has gained 5 lb since her 09/2021 OV despite improving her diet. She hasn't started regular exercise and is unable to currently with knee injury. She reports some abdominal bloating. She is wondering about receiving a repeat colonoscopy, which she previously had done in 2020. She was recommended to receive a repeat in 7 years, but wants one sooner. She reports prior H pylori infection diagnosed and treated in 2020. However, she reports she was noncompliant with the medication regimen (taking around half of what she was prescribed) and so is concerned this might have returned.  Still not interested in COVID vaccines, but is considering these.  PERTINENT  PMH / PSH: HTN, Carpal tunnel syndrome, myofascial muscle  pain, weight gain  OBJECTIVE:   BP 116/83   Pulse 97   Wt 224 lb 6.4 oz (101.8 kg)   SpO2 99%   BMI 38.52 kg/m   General: Patient is pleasant, in no acute distress. Cardiovascular: RRR, no murmurs, rubs, gallops. Pulmonary: Clear to auscultation bilaterally. Normal work of breathing. Abdominal: No tenderness to palpitation. MSK:  Left knee with some mild swelling, especially over the medial aspect. Left MCL seems to have a little bit of laxity, exam limited by pain with valgus stressing. LCL seems intact with varus stressing Positive McMurray on left medial aspect Negative Lachman on left Patella are normally mobile without tenderness Positive joint line tenderness on left Gait intact though does walk with a limp on left Mild tenderness with palpation of left greater trochanter. Right knee normal Neuro: A&Ox3 Psych: Normal affect  ASSESSMENT/PLAN:   Left knee pain Exam concerning for medial meniscal and possible MCL injury  We will begin work-up with x-rays.  Patient will go get these tomorrow.  I am also going to x-ray her left hip given some tenderness over that area. Discussed with patient that we will likely need MRI of left knee to further delineate  Morbid obesity ([place]) Patient reports 5 lb weight gain since 09/2021 despite improving diet; she has not yet started exercise regimen - Discussed the importance of regular exercise in managing weight, but acknowledge that pt is currently limited by knee injury - Encouraged continued healthy diet  Carpal tunnel syndrome Patient continues to  have symptoms bilaterally. She has not yet filled prescription for wrist splints due to misplacing prescription. - Repeat prescription provided for bilateral wrist splints  Chronic pain Patient continues to report pain, largely unchanged from prior visit. She has not yet reestablished with pain management, and is requesting to see Sabino Niemann, PA, at [facility]. - Referral placed to Sabino Niemann for management of pain   Prior H. Pylori Infection Patient was found to have H. Pylori during 2020 endoscopy, and was started on medications which patient was partially noncompliant with. She now expresses concern about repeat infection. - Recommended against repeat colonoscopy at this time, as she was recommended for repeat after 7 years (in 2027). - Will arrange for urea breath test at next OV to evaluate for H. Pylori, time did not allow for this today  FOLLOW UP: Schedule at next available appointment to follow-up  Carmelina Dane, [place]   Patient seen along with [facility]. I personally evaluated this patient along with the student, and verified all aspects of the history, physical exam, and medical decision making as documented by the student. I agree with the student's documentation and have made all necessary edits.  Chrisandra Netters, MD  [place]

## 2021-11-16 NOTE — Assessment & Plan Note (Signed)
Patient reports 5 lb weight gain since 09/2021 despite improving diet; she has not yet started exercise regimen - Discussed the importance of regular exercise in managing weight, but acknowledge that pt is currently limited by knee injury - Encouraged continued healthy diet

## 2021-11-16 NOTE — Patient Instructions (Signed)
It was great to see you again today!  Go get knee xray Suspect we will end up needing MRI but will start with xray  Referring to the pain management office you requested  Go get wrist splints  Will recheck for H pylori next visit, will do breath test  Schedule follow up with me in 1 month   Be well, Dr. Ardelia Mems

## 2021-11-18 ENCOUNTER — Ambulatory Visit
Admission: RE | Admit: 2021-11-18 | Discharge: 2021-11-18 | Disposition: A | Discharge status: Home / Self Care | Payer: Medicare Other | Source: Ambulatory Visit | Attending: Family Medicine | Admitting: Family Medicine

## 2021-11-18 DIAGNOSIS — M25552 Pain in left hip: Secondary | ICD-10-CM | POA: Diagnosis not present

## 2021-11-18 DIAGNOSIS — M25562 Pain in left knee: Secondary | ICD-10-CM

## 2021-11-19 ENCOUNTER — Encounter: Payer: Self-pay | Admitting: Family Medicine

## 2021-11-19 NOTE — Addendum Note (Signed)
Addended by: [name] on: 11/19/2021 04:37 PM   Modules accepted: Orders

## 2021-12-12 ENCOUNTER — Ambulatory Visit (HOSPITAL_COMMUNITY)
Admission: RE | Admit: 2021-12-12 | Discharge: 2021-12-12 | Disposition: A | Discharge status: Home / Self Care | Payer: Medicare Other | Source: Ambulatory Visit | Attending: Family Medicine | Admitting: Family Medicine

## 2021-12-12 DIAGNOSIS — S83412A Sprain of medial collateral ligament of left knee, initial encounter: Secondary | ICD-10-CM | POA: Diagnosis not present

## 2021-12-12 DIAGNOSIS — M25462 Effusion, left knee: Secondary | ICD-10-CM | POA: Diagnosis not present

## 2021-12-12 DIAGNOSIS — M25562 Pain in left knee: Secondary | ICD-10-CM | POA: Insufficient documentation

## 2021-12-15 ENCOUNTER — Telehealth: Payer: Self-pay | Admitting: Family Medicine

## 2021-12-15 DIAGNOSIS — S83412D Sprain of medial collateral ligament of left knee, subsequent encounter: Secondary | ICD-10-CM

## 2021-12-15 NOTE — Telephone Encounter (Signed)
Called patient to discuss MRI results which show high-grade partial MCL tear and intermediate grade partial tear of PCL.  Recommend patient sees an orthopedic surgeon.  She has previously had ankle surgery with Dr. Lucia Gaskins of [facility].  Referral placed.  Patient appreciative.  [name], MD

## 2021-12-21 ENCOUNTER — Ambulatory Visit: Payer: Medicare Other | Admitting: Family Medicine

## 2021-12-22 DIAGNOSIS — M25562 Pain in left knee: Secondary | ICD-10-CM | POA: Diagnosis not present

## 2021-12-23 ENCOUNTER — Other Ambulatory Visit: Payer: Self-pay | Admitting: Orthopaedic Surgery

## 2021-12-23 DIAGNOSIS — M79672 Pain in left foot: Secondary | ICD-10-CM

## 2021-12-23 DIAGNOSIS — M25572 Pain in left ankle and joints of left foot: Secondary | ICD-10-CM | POA: Diagnosis not present

## 2022-01-04 ENCOUNTER — Ambulatory Visit
Admission: RE | Admit: 2022-01-04 | Discharge: 2022-01-04 | Disposition: A | Discharge status: Home / Self Care | Payer: Medicare Other | Source: Ambulatory Visit | Attending: Orthopaedic Surgery | Admitting: Orthopaedic Surgery

## 2022-01-04 DIAGNOSIS — M25562 Pain in left knee: Secondary | ICD-10-CM | POA: Diagnosis not present

## 2022-01-04 DIAGNOSIS — M79672 Pain in left foot: Secondary | ICD-10-CM

## 2022-01-04 DIAGNOSIS — M6281 Muscle weakness (generalized): Secondary | ICD-10-CM | POA: Diagnosis not present

## 2022-01-04 DIAGNOSIS — R269 Unspecified abnormalities of gait and mobility: Secondary | ICD-10-CM | POA: Diagnosis not present

## 2022-01-04 DIAGNOSIS — M25662 Stiffness of left knee, not elsewhere classified: Secondary | ICD-10-CM | POA: Diagnosis not present

## 2022-01-11 DIAGNOSIS — G8929 Other chronic pain: Secondary | ICD-10-CM | POA: Diagnosis not present

## 2022-01-11 DIAGNOSIS — R0789 Other chest pain: Secondary | ICD-10-CM | POA: Diagnosis not present

## 2022-01-11 DIAGNOSIS — M25572 Pain in left ankle and joints of left foot: Secondary | ICD-10-CM | POA: Diagnosis not present

## 2022-01-11 DIAGNOSIS — M542 Cervicalgia: Secondary | ICD-10-CM | POA: Diagnosis not present

## 2022-01-11 DIAGNOSIS — I972 Postmastectomy lymphedema syndrome: Secondary | ICD-10-CM | POA: Diagnosis not present

## 2022-01-18 ENCOUNTER — Encounter: Payer: Self-pay | Admitting: Family Medicine

## 2022-01-18 ENCOUNTER — Ambulatory Visit (INDEPENDENT_AMBULATORY_CARE_PROVIDER_SITE_OTHER): Payer: Medicare Other | Admitting: Family Medicine

## 2022-01-18 VITALS — BP 138/92 | HR 85 | Ht 64.0 in | Wt 231.8 lb

## 2022-01-18 DIAGNOSIS — I10 Essential (primary) hypertension: Secondary | ICD-10-CM

## 2022-01-18 DIAGNOSIS — J069 Acute upper respiratory infection, unspecified: Secondary | ICD-10-CM | POA: Diagnosis not present

## 2022-01-18 DIAGNOSIS — R059 Cough, unspecified: Secondary | ICD-10-CM | POA: Diagnosis not present

## 2022-01-18 DIAGNOSIS — M6281 Muscle weakness (generalized): Secondary | ICD-10-CM | POA: Diagnosis not present

## 2022-01-18 DIAGNOSIS — R269 Unspecified abnormalities of gait and mobility: Secondary | ICD-10-CM | POA: Diagnosis not present

## 2022-01-18 DIAGNOSIS — M25562 Pain in left knee: Secondary | ICD-10-CM | POA: Diagnosis not present

## 2022-01-18 DIAGNOSIS — G8929 Other chronic pain: Secondary | ICD-10-CM

## 2022-01-18 DIAGNOSIS — M25662 Stiffness of left knee, not elsewhere classified: Secondary | ICD-10-CM | POA: Diagnosis not present

## 2022-01-18 NOTE — Assessment & Plan Note (Deleted)
Swelling has returned. Obtain a compression sleeve from rehab center for swelling.

## 2022-01-18 NOTE — Assessment & Plan Note (Signed)
Continue to take prescribed medications from pain management clinic. Follow-up with them in 1 month as scheduled.

## 2022-01-18 NOTE — Patient Instructions (Signed)
It was great to see you again today!  Testing for COVID - will let you know results Let me know if not any better at all by Friday  Follow up in 1 month to do breath test and recheck blood pressure  Can try flonase, ibuprofen for sinus pressure Also ok to do afrin to relieve congestion  Be well, Dr. Ardelia Mems

## 2022-01-18 NOTE — Assessment & Plan Note (Signed)
Blood pressure is elevated 152/97 today, repeat 138/92. Likely elevated due to acute illness. Will follow-up and recheck in 1 month.

## 2022-01-18 NOTE — Progress Notes (Signed)
  Date of Visit: 01/18/2022   HPI:  Karen Hopkins is a 46 y.o. who presents to clinic for follow-up:  Nasal Congestion Patient reports 2 days of nasal congestion, frontal and periorbital sinus pressure, facial pain, and cough. She denies sick contacts, fever, runny nose, nausea, vomiting, and diarrhea. She has tried Benadryl and Vicks Sinus Max without relief  L knee MCL and ACL tear Patient attends PT weekly. She saw ortho and they did not recommend surgery. She reports the pain and swelling is better but not completely resolved. She endorses limping.  Pain management Patient went to the pain management clinic on 01/11/22 and received prescriptions for Norco 5-'325mg'$  q8hrs PRN, Gabapentin '100mg'$  BID, Duloxetine '30mg'$  qD for 7 days then BID, and Baclofen '10mg'$  BID PRN. Patient reports pain is well controlled. She has not started Duloxetine or Gabapentin yet and will pick them up today.  PHYSICAL EXAM: BP (!) 138/92   Pulse 85   Ht '5\' 4"'$  (1.626 m)   Wt 231 lb 12.8 oz (105.1 kg)   SpO2 100%   BMI 39.79 kg/m  Gen: Well appearing, in no distress Pulm: Normal work of breathing. No wheezing, rales, or crackles. CV: Normal rate. No murmurs, rubs, or gallops. HEENT: mild tenderness of frontal and maxillary sinuses. Tympanic membranes are non-erythematous, not bulging, and without serous effusions. Nares patent with some injection of turbinates. Oropharynx clear and moist. No anterior cervical or supraclavicular lymphadenopathy.   ASSESSMENT/PLAN:  Cough, nasal congestion Likely viral infection given 2 days of symptoms. COVID swab today. Try ibuprofen, flonase, and afrin as needed for symptomatic management. Discussed no role for antibiotics this early in course; advised to let me know if no better by Friday (day 7 of symptoms).  Essential hypertension Blood pressure is elevated 152/97 today, repeat 138/92. Likely elevated due to acute illness. Will follow-up and recheck in 1 month.  Chronic  pain Continue to take prescribed medications from pain management clinic. Follow-up with them in 1 month as scheduled.  MCL and ACL tear of L knee Improving. Continue to follow-up with physical therapy and ortho  Prior H. Pylori Patient had history of H. Pylori in 2020 and did not complete treatment. Urea breath test deferred today given viral symptoms. Return in 1 month for UBT.  HM Encouraged patient to get flu vaccine.   Follow-up Return to care in 1 mo for UBT and BP check.  Leonette Nutting, [facility]  Patient seen along with MS3 student Leonette Nutting. I personally evaluated this patient along with the student, and verified all aspects of the history, physical exam, and medical decision making as documented by the student. I agree with the student's documentation and have made all necessary edits.  Chrisandra Netters, MD  [place]

## 2022-01-20 DIAGNOSIS — M25562 Pain in left knee: Secondary | ICD-10-CM | POA: Diagnosis not present

## 2022-01-20 DIAGNOSIS — R269 Unspecified abnormalities of gait and mobility: Secondary | ICD-10-CM | POA: Diagnosis not present

## 2022-01-20 DIAGNOSIS — M6281 Muscle weakness (generalized): Secondary | ICD-10-CM | POA: Diagnosis not present

## 2022-01-20 DIAGNOSIS — M25662 Stiffness of left knee, not elsewhere classified: Secondary | ICD-10-CM | POA: Diagnosis not present

## 2022-01-23 LAB — NOVEL CORONAVIRUS, NAA: SARS-CoV-2, NAA: NOT DETECTED

## 2022-02-04 ENCOUNTER — Ambulatory Visit: Payer: Medicare Other

## 2022-02-18 ENCOUNTER — Ambulatory Visit (INDEPENDENT_AMBULATORY_CARE_PROVIDER_SITE_OTHER): Payer: Medicare Other | Admitting: Family Medicine

## 2022-02-18 ENCOUNTER — Encounter: Payer: Self-pay | Admitting: Family Medicine

## 2022-02-18 VITALS — BP 128/72 | HR 80 | Ht 64.0 in | Wt 231.8 lb

## 2022-02-18 DIAGNOSIS — I10 Essential (primary) hypertension: Secondary | ICD-10-CM | POA: Diagnosis not present

## 2022-02-18 DIAGNOSIS — J328 Other chronic sinusitis: Secondary | ICD-10-CM

## 2022-02-18 DIAGNOSIS — I890 Lymphedema, not elsewhere classified: Secondary | ICD-10-CM | POA: Diagnosis not present

## 2022-02-18 DIAGNOSIS — Z8619 Personal history of other infectious and parasitic diseases: Secondary | ICD-10-CM

## 2022-02-18 DIAGNOSIS — K429 Umbilical hernia without obstruction or gangrene: Secondary | ICD-10-CM | POA: Diagnosis not present

## 2022-02-18 MED ORDER — TETANUS-DIPHTH-ACELL PERTUSSIS 5-2.5-18.5 LF-MCG/0.5 IM SUSY: 0.5000 mL | PREFILLED_SYRINGE | Freq: Once | INTRAMUSCULAR | 0 refills | Status: AC

## 2022-02-18 NOTE — Progress Notes (Unsigned)
  Date of Visit: 02/18/2022   HPI:  Dezirae Service [place] is a 46 y.o. here for f/u:  Hypertension Patient reports she does not have a BP cuff to check her BP at home. No chest pain, SOB, or dizziness.  Sinus Congestion Patient has had ~1 month of sinus congestion and facial pressure. She has tried ibuprofen, tylenol, vicks sinus max, flonase, and allegra without much improvement in symptoms. She denies fever, cough, nausea, or vomiting. She is interested in a referral to an allergist.  Umbilical Hernia Patient had a hernia repair in 2006. Today, she is concerned that the hernia has returned. She reports that for 2 years she has felt a mass near her umbilicus whenever she coughs or bears down. She reports she has normal bowel movements- 2-3x/day. She denies abdominal pain.  Weight Gain Patient has gained ~30lbs since November 2022. She has been walking every day for 20 minutes. She eats 2 meals per day including lots of fruits and vegetables, occasional fried foods, fish, and baked foods. She snacks on popcorn. She will drink at most a coke a day. She is interested in weight loss medications.  Lymphedema of R arm Patient is interested in referral to lymphedema rehab center.  PHYSICAL EXAM: BP 128/72   Pulse 80   Ht '5\' 4"'$  (1.626 m)   Wt 231 lb 12.8 oz (105.1 kg)   SpO2 100%   BMI 39.79 kg/m  Gen: Well appearing in no distress. HEENT: Tympanic membranes are gray and not bulging. Nasal turbinates are large and inflamed. Frontal sinuses are tender to palpation. Throat is non-erythematous. Pulm: Normal respiratory effort. No wheezing or crackles. CV: Normal rate. Normal S1 and S2. Abd: Tenderness to palpation of umbilicus. No abdominal masses palpated while bearing down.  ASSESSMENT/PLAN: History of H. Pylori UBT today. Will f/u with results.  Umbilical hernia Stable without constipation worrisome for bowel obstruction. CT scan on June 2020 notable for umbilical hernia with clip.  Referral placed to general surgery for management.  Essential hypertension Blood pressure is at goal <130/80 at 128/72 today. Continue to monitor BPs. Obtain a BP cuff.  RHINOSINUSITIS, CHRONIC This is less likely viral or bacterial and more likely allergic rhinitis given chronicity with absence of fevers and cough. Will continue symptomatic relief with flonase, allegra, vicks sinus max, and nasal saline. Referral to allergist placed.  Morbid obesity ([place]) Patient with 30 lb weight gain since November 2022. Weight is steady since August 2023. Discussed continuing with healthy diet changes including reducing soda intake and fried foods. Patient will [facility] for further evaluation.  Lymphedema of right arm Lymphedema is persistent despite using arm sleeve. Referral placed to rehab center.  HM Encouraged patient to get Flu and Tdap vaccines.  Follow-up Return for f/u as needed or in 6 months for chronic medical conditions.   Leonette Nutting, [place]

## 2022-02-18 NOTE — Assessment & Plan Note (Signed)
This is less likely viral or bacterial and more likely allergic rhinitis given chronicity with absence of fevers and cough. Will continue symptomatic relief with flonase, allegra, vicks sinus max, and nasal saline. Referral to allergist placed.

## 2022-02-18 NOTE — Assessment & Plan Note (Signed)
Patient with 30 lb weight gain since November 2022. Weight is steady since August 2023. Discussed continuing with healthy diet changes including reducing soda intake and fried foods. Patient will call [facility] for further evaluation.

## 2022-02-18 NOTE — Patient Instructions (Signed)
It was great to see you again today!  Testing for H pylori today  Referring to surgeon for your hernia  Referring to allergist to see about allergy shots  Referring to rehab for your lymphedema  Call the [place] to set up an appointment for weight management. Their number is [phone].   Be well, Dr. Ardelia Mems

## 2022-02-18 NOTE — Assessment & Plan Note (Signed)
Blood pressure is at goal <130/80 at 128/72 today. Continue to monitor BPs. Obtain a BP cuff.

## 2022-02-18 NOTE — Assessment & Plan Note (Signed)
Lymphedema is persistent despite using arm sleeve. Referral placed to rehab center.

## 2022-02-20 LAB — H. PYLORI BREATH TEST: H pylori Breath Test: POSITIVE — AB

## 2022-02-22 ENCOUNTER — Encounter: Payer: Self-pay | Admitting: Family Medicine

## 2022-02-22 MED ORDER — BISMUTH SUBSALICYLATE 525 MG PO TABS: 1.0000 | ORAL_TABLET | Freq: Four times a day (QID) | ORAL | 0 refills | Status: AC

## 2022-02-22 MED ORDER — METRONIDAZOLE 250 MG PO TABS: 250.0000 mg | ORAL_TABLET | Freq: Four times a day (QID) | ORAL | 0 refills | Status: AC

## 2022-02-22 MED ORDER — TETRACYCLINE HCL 500 MG PO CAPS: 500.0000 mg | ORAL_CAPSULE | Freq: Four times a day (QID) | ORAL | 0 refills | Status: AC

## 2022-02-22 MED ORDER — PANTOPRAZOLE SODIUM 40 MG PO TBEC: 40.0000 mg | DELAYED_RELEASE_TABLET | Freq: Two times a day (BID) | ORAL | 0 refills | Status: DC

## 2022-02-25 ENCOUNTER — Encounter: Payer: Self-pay | Admitting: Oncology

## 2022-02-25 ENCOUNTER — Other Ambulatory Visit: Payer: Self-pay | Admitting: Family Medicine

## 2022-02-25 ENCOUNTER — Other Ambulatory Visit (HOSPITAL_COMMUNITY): Payer: Self-pay

## 2022-02-25 MED ORDER — BACLOFEN 10 MG PO TABS
10.0000 mg | ORAL_TABLET | Freq: Two times a day (BID) | ORAL | 0 refills | Status: DC | PRN
  Filled 2022-02-25: qty 30, 15d supply, fill #0

## 2022-02-26 ENCOUNTER — Other Ambulatory Visit (HOSPITAL_COMMUNITY): Payer: Self-pay

## 2022-03-05 MED ORDER — FLUCONAZOLE 150 MG PO TABS: 150.0000 mg | ORAL_TABLET | Freq: Once | ORAL | 0 refills | Status: AC

## 2022-03-05 NOTE — Telephone Encounter (Signed)
Called patient to discuss request. She has not yet started the quadruple therapy as she's had a head cold, is improving, symptoms only present for 2 days.  Advised she can wait until she feels better then start the medication. Sent in diflucan for her in case she needs it, she is Patent attorney.  [name], MD

## 2022-03-09 DIAGNOSIS — K429 Umbilical hernia without obstruction or gangrene: Secondary | ICD-10-CM | POA: Diagnosis not present

## 2022-03-15 DIAGNOSIS — G8929 Other chronic pain: Secondary | ICD-10-CM | POA: Diagnosis not present

## 2022-03-15 DIAGNOSIS — M25572 Pain in left ankle and joints of left foot: Secondary | ICD-10-CM | POA: Diagnosis not present

## 2022-03-15 DIAGNOSIS — R0789 Other chest pain: Secondary | ICD-10-CM | POA: Diagnosis not present

## 2022-03-15 DIAGNOSIS — M542 Cervicalgia: Secondary | ICD-10-CM | POA: Diagnosis not present

## 2022-03-15 DIAGNOSIS — I972 Postmastectomy lymphedema syndrome: Secondary | ICD-10-CM | POA: Diagnosis not present

## 2022-03-15 NOTE — Therapy (Signed)
OUTPATIENT PHYSICAL THERAPY ONCOLOGY EVALUATION  Patient Name: Karen Hopkins MRN: [id number] DOB:06/18/1975, 46 y.o., female Today's Date: 03/15/2022    Past Medical History:  Diagnosis Date   Anxiety    Arthritis    Cancer ([place]) 2011   Rt. Br. Ca   Depression    Diabetes mellitus without complication ([place])    has been "borderline"   Essential hypertension 11/05/2015   History of radiation therapy    Hypertension    under control with med., has been on med. x 2 yr.   Lymphedema of arm    right; no BP or puncture to right arm   Personal history of chemotherapy 09/16/2009   rt breast   Pneumonia    Seasonal allergies    Sinus headache    Past Surgical History:  Procedure Laterality Date   BREAST BIOPSY Right 08/26/2009   BREAST REDUCTION SURGERY Left 08/23/2013   Procedure: LEFT BREAST REDUCTION  ;  Surgeon: Theodoro Kos, DO;  Location: [place];  Service: Plastics;  Laterality: Left;   BREAST REDUCTION WITH MASTOPEXY Left 08/22/2019   Procedure: Left breast mastopexy reduction with liposuction for breast symmetry;  Surgeon: Wallace Going, DO;  Location: [place];  Service: Plastics;  Laterality: Left;   CESAREAN SECTION  07/15/2001; 03/18/2004; 11/21/2008   lateral orbiotomy Left 11/2015   Dr. Toy Cookey at [place] Right 03/12/2013   Procedure: RIGHT BREAST LATISSIMUS FLAP WITH EXPANDER PLACEMENT;  Surgeon: Theodoro Kos, DO;  Location: [place];  Service: Plastics;  Laterality: Right;   LIPOSUCTION Bilateral 08/23/2013   Procedure: LIPOSUCTION;  Surgeon: Theodoro Kos, DO;  Location: [place];  Service: Plastics;  Laterality: Bilateral;   LIPOSUCTION WITH LIPOFILLING Left 08/22/2019   Procedure: LIPOSUCTION WITH LIPOFILLING;  Surgeon: Wallace Going, DO;  Location: [place];  Service: Plastics;  Laterality: Left;   MASTECTOMY Right 2011   MODIFIED RADICAL MASTECTOMY Right 03/11/2010    NASAL SEPTUM SURGERY     ORIF ANKLE FRACTURE Left 07/18/2018   Procedure: OPEN REDUCTION INTERNAL FIXATION (ORIF) LEFT ANKLE FRACTURE WITH  SYNDESMOSIS AND ANKLE ARTHROTOMY;  Surgeon: Erle Crocker, MD;  Location: [place];  Service: Orthopedics;  Laterality: Left;   PORT-A-CATH REMOVAL Left 12/01/2010   PORTACATH PLACEMENT Left 09/12/2009   REDUCTION MAMMAPLASTY Left 08/2013   REMOVAL OF TISSUE EXPANDER AND PLACEMENT OF IMPLANT Right 08/23/2013   Procedure: REMOVAL RIGHT TISSUE EXPANDER AND PLACEMENT OF IMPLANT TO RIGHT BREAST ;  Surgeon: Theodoro Kos, DO;  Location: [place];  Service: Plastics;  Laterality: Right;   SUPRA-UMBILICAL HERNIA  [id number]   TUBAL LIGATION  11/21/2008   Patient Active Problem List   Diagnosis Date Noted   Carpal tunnel syndrome 10/07/2021   Genetic testing 08/14/2019   Breast asymmetry following reconstructive surgery 07/13/2019   Family history of breast cancer    Family history of prostate cancer    Unintentional weight loss 05/21/2019   Acute pyelonephritis 11/16/2018   Lymphedema of right arm 02/27/2018   Dyspnea 09/23/2016   Eczema 08/31/2016   Essential hypertension 11/05/2015   Cervical dystonia 08/25/2015   Headache 07/09/2015   Myofascial muscle pain 04/29/2015   Adhesive capsulitis of right shoulder 04/29/2015   Lower extremity edema 10/01/2014   Malignant neoplasm of overlapping sites of right breast in female, estrogen receptor positive ([place]) 05/24/2014   Myalgia and myositis 05/17/2014   Neoplasm related pain 03/12/2014  CN (constipation) 11/26/2013   H/O reduction mammoplasty 10/30/2013   Seizure ([place]) 10/06/2013   Status post bilateral breast implants 08/31/2013   S/P breast reconstruction, right 08/23/2013   AC joint arthropathy 07/02/2013   Routine adult health maintenance 05/29/2013   Hypokalemia 05/21/2013   Chronic pain 04/19/2013   Acquired absence of breast and absent nipple 01/30/2013    RHINOSINUSITIS, CHRONIC 04/08/2010   Morbid obesity ([place]) 08/04/2006   DEPRESSIVE DISORDER, NOS 08/04/2006    PCP: ***  REFERRING PROVIDER: [name], MD   REFERRING DIAG: I89.0 (ICD-10-CM) - Lymphedema of right arm   THERAPY DIAG:  No diagnosis found.  ONSET DATE: ***  Rationale for Evaluation and Treatment Rehabilitation  SUBJECTIVE                                                                                                                                                                                           SUBJECTIVE STATEMENT:  PERTINENT HISTORY:  Right breast cancer with mastectomy 2011 and ALND 22 nodes; reconstruction; left breast reduction; h/o right UE lymphedema and right upper quadrant pain, treated in this clinic several times before.  PAIN:  Are you having pain? {yes/no:20286} NPRS scale: ***/10 Pain location: *** Pain orientation: {Pain Orientation:25161}  PAIN TYPE: {type:313116} Pain description: {PAIN DESCRIPTION:21022940}  Aggravating factors: *** Relieving factors: ***  PRECAUTIONS: {Therapy precautions:24002}  WEIGHT BEARING RESTRICTIONS {Yes ***/No:24003}  FALLS:  Has patient fallen in last 6 months? {fallsyesno:27318}  LIVING ENVIRONMENT: Lives with: {[facility] lives with:25569::"lives with their family"} Lives in: {Lives in:25570} Stairs: {yes/no:20286}; {Stairs:24000} Has following equipment at home: {Assistive devices:23999}  OCCUPATION: ***  LEISURE: ***  HAND DOMINANCE : {RIGHT/LEFT:21944}   PRIOR LEVEL OF FUNCTION: {PLOF:24004}  PATIENT GOALS ***   OBJECTIVE  COGNITION:  Overall cognitive status: {cognition:24006}   PALPATION: ***  OBSERVATIONS / OTHER ASSESSMENTS: ***  SENSATION:  Light touch: {intact/deficits:24005}  Stereognosis: {intact/deficits:24005}  Hot/Cold: {intact/deficits:24005}  Proprioception: {intact/deficits:24005}  POSTURE: ***  UPPER EXTREMITY AROM/PROM:  A/PROM RIGHT   eval    Shoulder extension   Shoulder flexion   Shoulder abduction   Shoulder internal rotation   Shoulder external rotation     (Blank rows = not tested)  A/PROM LEFT   eval  Shoulder extension   Shoulder flexion   Shoulder abduction   Shoulder internal rotation   Shoulder external rotation     (Blank rows = not tested)   LYMPHEDEMA ASSESSMENTS:   SURGERY TYPE/DATE: R mastectomy and ALND in 2011  NUMBER OF LYMPH NODES REMOVED: 1/20  CHEMOTHERAPY: completed  RADIATION:completed Jan 2012  HORMONE TREATMENT: anastrozole from Nov 2016 to Dec 2019  INFECTIONS: ***  LYMPHEDEMA ASSESSMENTS:   LANDMARK RIGHT  eval  10 cm proximal to olecranon process   Olecranon process   10 cm proximal to ulnar styloid process   Just proximal to ulnar styloid process   Across hand at thumb web space   At base of 2nd digit   (Blank rows = not tested)  LANDMARK LEFT  eval  10 cm proximal to olecranon process   Olecranon process   10 cm proximal to ulnar styloid process   Just proximal to ulnar styloid process   Across hand at thumb web space   At base of 2nd digit   (Blank rows = not tested)   QUICK DASH SURVEY: ***   TODAY'S TREATMENT  ***  PATIENT EDUCATION:  Education details: *** [place] educated: {[place] educated:25204} Education method: {Education Method:25205} Education comprehension: {Education Comprehension:25206}   HOME EXERCISE PROGRAM: ***  ASSESSMENT:  CLINICAL IMPRESSION: Patient is a *** y.o. *** who was seen today for physical therapy evaluation and treatment for ***.    OBJECTIVE IMPAIRMENTS {opptimpairments:25111}.   ACTIVITY LIMITATIONS {activitylimitations:27494}  PARTICIPATION LIMITATIONS: {participationrestrictions:25113}  PERSONAL FACTORS {Personal factors:25162} are also affecting patient's functional outcome.   REHAB POTENTIAL: {rehabpotential:25112}  CLINICAL DECISION MAKING: {clinical decision making:25114}  EVALUATION COMPLEXITY:  {Evaluation complexity:25115}  GOALS: Goals reviewed with patient? {yes/no:20286}  SHORT TERM GOALS: Target date: {follow up:25551}    *** Baseline: Goal status: {GOALSTATUS:25110}  2.  *** Baseline:  Goal status: {GOALSTATUS:25110}  3.  *** Baseline:  Goal status: {GOALSTATUS:25110}  4.  *** Baseline:  Goal status: {GOALSTATUS:25110}  5.  *** Baseline:  Goal status: {GOALSTATUS:25110}  6.  *** Baseline:  Goal status: {GOALSTATUS:25110}  LONG TERM GOALS: Target date: {follow up:25551}    *** Baseline:  Goal status: {GOALSTATUS:25110}  2.  *** Baseline:  Goal status: {GOALSTATUS:25110}  3.  *** Baseline:  Goal status: {GOALSTATUS:25110}  4.  *** Baseline:  Goal status: {GOALSTATUS:25110}  5.  *** Baseline:  Goal status: {GOALSTATUS:25110}  6.  *** Baseline:  Goal status: {GOALSTATUS:25110}  PLAN: PT FREQUENCY: {rehab frequency:25116}  PT DURATION: {rehab duration:25117}  PLANNED INTERVENTIONS: {rehab planned interventions:25118::"Therapeutic exercises","Therapeutic activity","Neuromuscular re-education","Balance training","Gait training","Patient/Family education","Self Care","Joint mobilization"}  PLAN FOR NEXT SESSION: ***   Manus Gunning, PT 03/15/2022, 2:47 PM

## 2022-03-16 ENCOUNTER — Ambulatory Visit: Payer: Medicare Other | Attending: Family Medicine | Admitting: Physical Therapy

## 2022-03-16 ENCOUNTER — Other Ambulatory Visit: Payer: Self-pay

## 2022-03-16 ENCOUNTER — Encounter: Payer: Self-pay | Admitting: Physical Therapy

## 2022-03-16 DIAGNOSIS — I972 Postmastectomy lymphedema syndrome: Secondary | ICD-10-CM | POA: Insufficient documentation

## 2022-03-16 DIAGNOSIS — M25611 Stiffness of right shoulder, not elsewhere classified: Secondary | ICD-10-CM | POA: Insufficient documentation

## 2022-03-16 DIAGNOSIS — Z853 Personal history of malignant neoplasm of breast: Secondary | ICD-10-CM | POA: Insufficient documentation

## 2022-03-16 DIAGNOSIS — I890 Lymphedema, not elsewhere classified: Secondary | ICD-10-CM | POA: Diagnosis present

## 2022-03-18 ENCOUNTER — Ambulatory Visit: Payer: Medicare Other | Admitting: Rehabilitation

## 2022-03-18 ENCOUNTER — Encounter: Payer: Self-pay | Admitting: Rehabilitation

## 2022-03-18 DIAGNOSIS — I972 Postmastectomy lymphedema syndrome: Secondary | ICD-10-CM | POA: Diagnosis not present

## 2022-03-18 DIAGNOSIS — Z853 Personal history of malignant neoplasm of breast: Secondary | ICD-10-CM

## 2022-03-18 DIAGNOSIS — M25611 Stiffness of right shoulder, not elsewhere classified: Secondary | ICD-10-CM | POA: Diagnosis not present

## 2022-03-18 NOTE — Therapy (Signed)
OUTPATIENT PHYSICAL THERAPY ONCOLOGY TREATMENT  Patient Name: Karen Hopkins MRN: [id number] DOB:January 24, 1976, 46 y.o., female Today's Date: 03/18/2022   PT End of Session - 03/18/22 0857     Visit Number 2    Number of Visits 13    Date for PT Re-Evaluation 04/13/22    PT Start Time 0900    PT Stop Time 0950    PT Time Calculation (min) 50 min    Activity Tolerance Patient tolerated treatment well    Behavior During Therapy [facility] for tasks assessed/performed             Past Medical History:  Diagnosis Date   Anxiety    Arthritis    Cancer ([place]) 2011   Rt. Br. Ca   Depression    Diabetes mellitus without complication ([name])    has been "borderline"   Essential hypertension 11/05/2015   History of radiation therapy    Hypertension    under control with med., has been on med. x 2 yr.   Lymphedema of arm    right; no BP or puncture to right arm   Personal history of chemotherapy 09/16/2009   rt breast   Pneumonia    Seasonal allergies    Sinus headache    Past Surgical History:  Procedure Laterality Date   BREAST BIOPSY Right 08/26/2009   BREAST REDUCTION SURGERY Left 08/23/2013   Procedure: LEFT BREAST REDUCTION  ;  Surgeon: Theodoro Kos, DO;  Location: [place];  Service: Plastics;  Laterality: Left;   BREAST REDUCTION WITH MASTOPEXY Left 08/22/2019   Procedure: Left breast mastopexy reduction with liposuction for breast symmetry;  Surgeon: Wallace Going, DO;  Location: [place];  Service: Plastics;  Laterality: Left;   CESAREAN SECTION  07/15/2001; 03/18/2004; 11/21/2008   lateral orbiotomy Left 11/2015   Dr. Toy Cookey at [place] Right 03/12/2013   Procedure: RIGHT BREAST LATISSIMUS FLAP WITH EXPANDER PLACEMENT;  Surgeon: Theodoro Kos, DO;  Location: [place];  Service: Plastics;  Laterality: Right;   LIPOSUCTION Bilateral 08/23/2013   Procedure: LIPOSUCTION;  Surgeon: Theodoro Kos, DO;  Location: [place];  Service: Plastics;  Laterality: Bilateral;   LIPOSUCTION WITH LIPOFILLING Left 08/22/2019   Procedure: LIPOSUCTION WITH LIPOFILLING;  Surgeon: Wallace Going, DO;  Location: [place];  Service: Plastics;  Laterality: Left;   MASTECTOMY Right 2011   MODIFIED RADICAL MASTECTOMY Right 03/11/2010   NASAL SEPTUM SURGERY     ORIF ANKLE FRACTURE Left 07/18/2018   Procedure: OPEN REDUCTION INTERNAL FIXATION (ORIF) LEFT ANKLE FRACTURE WITH  SYNDESMOSIS AND ANKLE ARTHROTOMY;  Surgeon: Erle Crocker, MD;  Location: [place];  Service: Orthopedics;  Laterality: Left;   PORT-A-CATH REMOVAL Left 12/01/2010   PORTACATH PLACEMENT Left 09/12/2009   REDUCTION MAMMAPLASTY Left 08/2013   REMOVAL OF TISSUE EXPANDER AND PLACEMENT OF IMPLANT Right 08/23/2013   Procedure: REMOVAL RIGHT TISSUE EXPANDER AND PLACEMENT OF IMPLANT TO RIGHT BREAST ;  Surgeon: Theodoro Kos, DO;  Location: [place];  Service: Plastics;  Laterality: Right;   SUPRA-UMBILICAL HERNIA  7741   TUBAL LIGATION  11/21/2008   Patient Active Problem List   Diagnosis Date Noted   Carpal tunnel syndrome 10/07/2021   Genetic testing 08/14/2019   Breast asymmetry following reconstructive surgery 07/13/2019   Family history of breast cancer    Family history of prostate cancer    Unintentional weight loss 05/21/2019  Acute pyelonephritis 11/16/2018   Lymphedema of right arm 02/27/2018   Dyspnea 09/23/2016   Eczema 08/31/2016   Essential hypertension 11/05/2015   Cervical dystonia 08/25/2015   Headache 07/09/2015   Myofascial muscle pain 04/29/2015   Adhesive capsulitis of right shoulder 04/29/2015   Lower extremity edema 10/01/2014   Malignant neoplasm of overlapping sites of right breast in female, estrogen receptor positive ([place]) 05/24/2014   Myalgia and myositis 05/17/2014   Neoplasm related pain 03/12/2014   CN (constipation) 11/26/2013   H/O reduction mammoplasty  10/30/2013   Seizure ([place]) 10/06/2013   Status post bilateral breast implants 08/31/2013   S/P breast reconstruction, right 08/23/2013   AC joint arthropathy 07/02/2013   Routine adult health maintenance 05/29/2013   Hypokalemia 05/21/2013   Chronic pain 04/19/2013   Acquired absence of breast and absent nipple 01/30/2013   RHINOSINUSITIS, CHRONIC 04/08/2010   Morbid obesity ([place]) 08/04/2006   DEPRESSIVE DISORDER, NOS 08/04/2006    PCP: Chrisandra Netters, MD  REFERRING PROVIDER: [name], MD   REFERRING DIAG: I89.0 (ICD-10-CM) - Lymphedema of right arm   THERAPY DIAG:  Postmastectomy lymphedema  Stiffness of right shoulder, not elsewhere classified  History of right breast cancer  ONSET DATE: 2011  Rationale for Evaluation and Treatment Rehabilitation  SUBJECTIVE                                                                                                                                                                                           SUBJECTIVE STATEMENT: My hand fell off but it was okay.  I took them off last night.  It was maybe a little smaller.    PERTINENT HISTORY:  Right breast cancer with mastectomy 2011 and ALND 22 nodes; reconstruction; left breast reduction; h/o right UE lymphedema and right upper quadrant pain, treated in this clinic several times before.   PAIN:  Are you having pain? Yes NPRS scale: 6/10 Pain location: R axilla Pain orientation: Right  PAIN TYPE: aching, burning, and dull Pain description: intermittent  Aggravating factors: over doing things with the arm Relieving factors: unsure  PRECAUTIONS: Other: has metal in ankle , MCL is torn  WEIGHT BEARING RESTRICTIONS No  FALLS:  Has patient fallen in last 6 months? Yes. Number of falls 1 was running from a hornet  LIVING ENVIRONMENT: Lives with:  lives with 3 sons Lives in: House/apartment Stairs: No;  Has following equipment at home: None  OCCUPATION: on  disability  LEISURE: pt has not been exercising, was doing some PT for torn MCL  HAND DOMINANCE : right   PRIOR LEVEL OF FUNCTION: Independent  PATIENT GOALS  to get my arm down and try to maintain it   OBJECTIVE  COGNITION:  Overall cognitive status: Within functional limits for tasks assessed    OBSERVATIONS / OTHER ASSESSMENTS: fullness of R upper arm when compared to L, small wound on lateral trunk from friction of R upper arm rubbing against it  POSTURE: forward head, rounded shoulders  UPPER EXTREMITY AROM/PROM:  A/PROM RIGHT   eval   Shoulder extension 53  Shoulder flexion 154  Shoulder abduction 151  Shoulder internal rotation 25  Shoulder external rotation 80    (Blank rows = not tested)  A/PROM LEFT   eval  Shoulder extension 59  Shoulder flexion 169  Shoulder abduction 176  Shoulder internal rotation 60  Shoulder external rotation 97    (Blank rows = not tested)   LYMPHEDEMA ASSESSMENTS:   SURGERY TYPE/DATE: R mastectomy and ALND in 2011  [place]: 1/20  CHEMOTHERAPY: completed  RADIATION:completed Jan 2012  HORMONE TREATMENT: anastrozole from Nov 2016 to Dec 2019  INFECTIONS: none  LYMPHEDEMA ASSESSMENTS:   LANDMARK RIGHT  eval  10 cm proximal to olecranon process 47.1  Olecranon process 32.6  10 cm proximal to ulnar styloid process 23.6  Just proximal to ulnar styloid process 17.7  Across hand at thumb web space 21.4  At base of 2nd digit 6.1  (Blank rows = not tested)  LANDMARK LEFT  eval  10 cm proximal to olecranon process 43  Olecranon process 30  10 cm proximal to ulnar styloid process 24.4  Just proximal to ulnar styloid process 17.3  Across hand at thumb web space 20.6  At base of 2nd digit 6.4  (Blank rows = not tested)   LLIS: 61.76   TODAY'S TREATMENT  03/18/22:  In supine: Short neck, 5 diaphragmatic breaths, L axillary nodes and establishment of interaxillary pathway, R inguinal nodes and  establishment of axilloinguinal pathway, then R UE working proximal to distal, moving inner upper arm outwards and upwards, and doing both sides of forearms, spending extra time in any areas of fibrosis then retracing all steps. Then work in left sidelying.  Compression bandaging as follows: TG soft from hand to axilla, rosidal from wrist to axilla, 1 6cm bandage at hand, 1 8cm bandage from wrist to axilla, 1 12 cm bandage in herringbone from wrist to axilla, 1 12 cm bandage from wrist to axilla  03/16/22: Compression bandaging as follows: TG soft from hand to axilla, rosidal from wrist to axilla, 1 6cm bandage at hand, 1 8cm bandage from wrist to axilla, 1 12 cm bandage in herringbone from wrist to axilla, 1 12 cm bandage from wrist to axilla  PATIENT EDUCATION:  Education details: new night time garments available, importance of using a compression pump, importance of bandaging for maximal reduction [place] educated: Patient Education method: Explanation Education comprehension: verbalized understanding  HOME EXERCISE PROGRAM: Keep bandages intact until next session and only remove if you have increased pain or numbness/tingling that does not go away when you  move  GARMENTS: pt has already used [employer] for garments, said she was measured by [employer] - emailed Liz on 03/18/22 to verify that she did not get any garments and to look into this.    ASSESSMENT:  CLINICAL IMPRESSION: Continued POC - CDT which was tolerated well.  OBJECTIVE IMPAIRMENTS decreased ROM and increased edema.   ACTIVITY LIMITATIONS carrying, lifting, and reach over head  PARTICIPATION LIMITATIONS:  none  PERSONAL FACTORS Time since onset of  injury/illness/exacerbation are also affecting patient's functional outcome.   REHAB POTENTIAL: Good  CLINICAL DECISION MAKING: Stable/uncomplicated  EVALUATION COMPLEXITY: Low  GOALS: Goals reviewed with patient? Yes   LONG TERM GOALS: Target date: 04/13/2022    Pt  will demonstrate a 2 cm reduction in edema at 10 cm proximal to olecranon process.  Baseline:  Goal status: INITIAL  2.  Pt will demonstrate 165 degrees of R shoulder abduction to allow pt to reach out to the side.  Baseline: 151 Goal status: INITIAL  3.  Pt will obtain appropriate day time and night time garments for long term management of R UE lymphedema.  Baseline:  Goal status: INITIAL  4.  Pt will be able to independently manage her lymphedema through self MLD and compression garments.  Baseline:  Goal status: INITIAL  5.  Pt will obtain a compression bra to help reduce friction on healing wound on lateral chest and to decrease swelling at lateral chest.  Baseline:  Goal status: INITIAL   PLAN: PT FREQUENCY: 3x/week  PT DURATION: 4 weeks  PLANNED INTERVENTIONS: Therapeutic exercises, Therapeutic activity, Patient/Family education, Self Care, Joint mobilization, Orthotic/Fit training, Manual lymph drainage, Compression bandaging, Vasopneumatic device, Manual therapy, and Re-evaluation  PLAN FOR NEXT SESSION: **give script for compression bra**, (sent note to Dr. Chryl Heck to see if we could issue prescription without having seen patient yet)  complete CDT for RUE, cont bandaging - adding finger bandaging if needed, eventually measure for garments, pt interested in circaid profile night time garment   Kobe Jansma, Adrian Prince, PT 03/18/2022, 9:50 AM

## 2022-03-19 DIAGNOSIS — H40023 Open angle with borderline findings, high risk, bilateral: Secondary | ICD-10-CM | POA: Diagnosis not present

## 2022-03-22 ENCOUNTER — Ambulatory Visit: Payer: Medicare Other | Admitting: Rehabilitation

## 2022-03-24 ENCOUNTER — Encounter: Payer: Self-pay | Admitting: Rehabilitation

## 2022-03-24 ENCOUNTER — Ambulatory Visit: Payer: Medicare Other | Admitting: Rehabilitation

## 2022-03-24 DIAGNOSIS — I972 Postmastectomy lymphedema syndrome: Secondary | ICD-10-CM | POA: Diagnosis not present

## 2022-03-24 DIAGNOSIS — M25611 Stiffness of right shoulder, not elsewhere classified: Secondary | ICD-10-CM

## 2022-03-24 DIAGNOSIS — Z853 Personal history of malignant neoplasm of breast: Secondary | ICD-10-CM

## 2022-03-24 NOTE — Therapy (Cosign Needed)
OUTPATIENT PHYSICAL THERAPY ONCOLOGY TREATMENT  Patient Name: Karen Hopkins MRN: [id number] DOB:[birth year], 46 y.o., female Today's Date: 03/24/2022   PT End of Session - 03/24/22 1650     Visit Number 3    Number of Visits 13    Date for PT Re-Evaluation 04/13/22    PT Start Time 1604    PT Stop Time 2993    PT Time Calculation (min) 46 min    Activity Tolerance Patient tolerated treatment well    Behavior During Therapy [facility] for tasks assessed/performed              Past Medical History:  Diagnosis Date   Anxiety    Arthritis    Cancer ([place]) 2011   Rt. Br. Ca   Depression    Diabetes mellitus without complication ([place])    has been "borderline"   Essential hypertension 11/05/2015   History of radiation therapy    Hypertension    under control with med., has been on med. x 2 yr.   Lymphedema of arm    right; no BP or puncture to right arm   Personal history of chemotherapy 09/16/2009   rt breast   Pneumonia    Seasonal allergies    Sinus headache    Past Surgical History:  Procedure Laterality Date   BREAST BIOPSY Right 08/26/2009   BREAST REDUCTION SURGERY Left 08/23/2013   Procedure: LEFT BREAST REDUCTION  ;  Surgeon: Theodoro Kos, DO;  Location: [place];  Service: Plastics;  Laterality: Left;   BREAST REDUCTION WITH MASTOPEXY Left 08/22/2019   Procedure: Left breast mastopexy reduction with liposuction for breast symmetry;  Surgeon: Wallace Going, DO;  Location: [place];  Service: Plastics;  Laterality: Left;   CESAREAN SECTION  07/15/2001; 03/18/2004; 11/21/2008   lateral orbiotomy Left 11/2015   Dr. Toy Cookey at [place] Right 03/12/2013   Procedure: RIGHT BREAST LATISSIMUS FLAP WITH EXPANDER PLACEMENT;  Surgeon: Theodoro Kos, DO;  Location: [place];  Service: Plastics;  Laterality: Right;   LIPOSUCTION Bilateral 08/23/2013   Procedure: LIPOSUCTION;  Surgeon: Theodoro Kos, DO;  Location: [place];  Service: Plastics;  Laterality: Bilateral;   LIPOSUCTION WITH LIPOFILLING Left 08/22/2019   Procedure: LIPOSUCTION WITH LIPOFILLING;  Surgeon: Wallace Going, DO;  Location: [place];  Service: Plastics;  Laterality: Left;   MASTECTOMY Right 2011   MODIFIED RADICAL MASTECTOMY Right 03/11/2010   NASAL SEPTUM SURGERY     ORIF ANKLE FRACTURE Left 07/18/2018   Procedure: OPEN REDUCTION INTERNAL FIXATION (ORIF) LEFT ANKLE FRACTURE WITH  SYNDESMOSIS AND ANKLE ARTHROTOMY;  Surgeon: Erle Crocker, MD;  Location: [place];  Service: Orthopedics;  Laterality: Left;   PORT-A-CATH REMOVAL Left 12/01/2010   PORTACATH PLACEMENT Left 09/12/2009   REDUCTION MAMMAPLASTY Left 08/2013   REMOVAL OF TISSUE EXPANDER AND PLACEMENT OF IMPLANT Right 08/23/2013   Procedure: REMOVAL RIGHT TISSUE EXPANDER AND PLACEMENT OF IMPLANT TO RIGHT BREAST ;  Surgeon: Theodoro Kos, DO;  Location: [place];  Service: Plastics;  Laterality: Right;   SUPRA-UMBILICAL HERNIA  7169   TUBAL LIGATION  11/21/2008   Patient Active Problem List   Diagnosis Date Noted   Carpal tunnel syndrome 10/07/2021   Genetic testing 08/14/2019   Breast asymmetry following reconstructive surgery 07/13/2019   Family history of breast cancer    Family history of prostate cancer    Unintentional weight loss 05/21/2019  Acute pyelonephritis 11/16/2018   Lymphedema of right arm 02/27/2018   Dyspnea 09/23/2016   Eczema 08/31/2016   Essential hypertension 11/05/2015   Cervical dystonia 08/25/2015   Headache 07/09/2015   Myofascial muscle pain 04/29/2015   Adhesive capsulitis of right shoulder 04/29/2015   Lower extremity edema 10/01/2014   Malignant neoplasm of overlapping sites of right breast in female, estrogen receptor positive ([place]) 05/24/2014   Myalgia and myositis 05/17/2014   Neoplasm related pain 03/12/2014   CN (constipation) 11/26/2013   H/O reduction mammoplasty  10/30/2013   Seizure ([place]) 10/06/2013   Status post bilateral breast implants 08/31/2013   S/P breast reconstruction, right 08/23/2013   AC joint arthropathy 07/02/2013   Routine adult health maintenance 05/29/2013   Hypokalemia 05/21/2013   Chronic pain 04/19/2013   Acquired absence of breast and absent nipple 01/30/2013   RHINOSINUSITIS, CHRONIC 04/08/2010   Morbid obesity ([place]) 08/04/2006   DEPRESSIVE DISORDER, NOS 08/04/2006    PCP: Chrisandra Netters, MD  REFERRING PROVIDER: [name], MD   REFERRING DIAG: I89.0 (ICD-10-CM) - Lymphedema of right arm   THERAPY DIAG:  Postmastectomy lymphedema  Stiffness of right shoulder, not elsewhere classified  History of right breast cancer  ONSET DATE: 2011  Rationale for Evaluation and Treatment Rehabilitation  SUBJECTIVE                                                                                                                                                                                           SUBJECTIVE STATEMENT: She states that the bandages didn't last long. She feels like she notices her arm getting smaller.   PERTINENT HISTORY:  Right breast cancer with mastectomy 2011 and ALND 22 nodes; reconstruction; left breast reduction; h/o right UE lymphedema and right upper quadrant pain, treated in this clinic several times before.   PAIN:  Are you having pain? Yes NPRS scale: 6/10 Pain location: R axilla Pain orientation: Right  PAIN TYPE: aching, burning, and dull Pain description: intermittent  Aggravating factors: over doing things with the arm Relieving factors: unsure  PRECAUTIONS: Other: has metal in ankle , MCL is torn  WEIGHT BEARING RESTRICTIONS No  FALLS:  Has patient fallen in last 6 months? Yes. Number of falls 1 was running from a hornet  LIVING ENVIRONMENT: Lives with:  lives with 3 sons Lives in: House/apartment Stairs: No;  Has following equipment at home: None  OCCUPATION: on  disability  LEISURE: pt has not been exercising, was doing some PT for torn MCL  HAND DOMINANCE : right   PRIOR LEVEL OF FUNCTION: Independent  PATIENT GOALS to get my arm down and  try to maintain it   OBJECTIVE  COGNITION:  Overall cognitive status: Within functional limits for tasks assessed    OBSERVATIONS / OTHER ASSESSMENTS: fullness of R upper arm when compared to L, small wound on lateral trunk from friction of R upper arm rubbing against it  POSTURE: forward head, rounded shoulders  UPPER EXTREMITY AROM/PROM:  A/PROM RIGHT   eval   Shoulder extension 53  Shoulder flexion 154  Shoulder abduction 151  Shoulder internal rotation 25  Shoulder external rotation 80    (Blank rows = not tested)  A/PROM LEFT   eval  Shoulder extension 59  Shoulder flexion 169  Shoulder abduction 176  Shoulder internal rotation 60  Shoulder external rotation 97    (Blank rows = not tested)   LYMPHEDEMA ASSESSMENTS:   SURGERY TYPE/DATE: R mastectomy and ALND in 2011  NUMBER OF LYMPH NODES REMOVED: 1/20  CHEMOTHERAPY: completed  RADIATION:completed Jan 2012  HORMONE TREATMENT: anastrozole from Nov 2016 to Dec 2019  INFECTIONS: none  LYMPHEDEMA ASSESSMENTS:   LANDMARK RIGHT  eval  10 cm proximal to olecranon process 47.1  Olecranon process 32.6  10 cm proximal to ulnar styloid process 23.6  Just proximal to ulnar styloid process 17.7  Across hand at thumb web space 21.4  At base of 2nd digit 6.1  (Blank rows = not tested)  LANDMARK LEFT  eval  10 cm proximal to olecranon process 43  Olecranon process 30  10 cm proximal to ulnar styloid process 24.4  Just proximal to ulnar styloid process 17.3  Across hand at thumb web space 20.6  At base of 2nd digit 6.4  (Blank rows = not tested)   LLIS: 61.76   TODAY'S TREATMENT   03/24/22:  In supine: Short neck, 5 diaphragmatic breaths, L axillary nodes and establishment of interaxillary pathway, R inguinal nodes  and establishment of axilloinguinal pathway, then R UE working proximal to distal, moving inner upper arm outwards and upwards, and doing both sides of forearms, spending extra time in any areas of fibrosis then retracing all steps. Then work in left sidelying.  Compression bandaging as follows: TG soft from hand to axilla, rosidal from wrist to axilla, 1 6cm bandage at hand, 1 8cm bandage from wrist to axilla, 1 12 cm bandage in herringbone from wrist to axilla, 1 12 cm bandage from wrist to axilla  03/18/22:  In supine: Short neck, 5 diaphragmatic breaths, L axillary nodes and establishment of interaxillary pathway, R inguinal nodes and establishment of axilloinguinal pathway, then R UE working proximal to distal, moving inner upper arm outwards and upwards, and doing both sides of forearms, spending extra time in any areas of fibrosis then retracing all steps. Then work in left sidelying.  Compression bandaging as follows: TG soft from hand to axilla, rosidal from wrist to axilla, 1 6cm bandage at hand, 1 8cm bandage from wrist to axilla, 1 12 cm bandage in herringbone from wrist to axilla, 1 12 cm bandage from wrist to axilla  03/16/22: Compression bandaging as follows: TG soft from hand to axilla, rosidal from wrist to axilla, 1 6cm bandage at hand, 1 8cm bandage from wrist to axilla, 1 12 cm bandage in herringbone from wrist to axilla, 1 12 cm bandage from wrist to axilla  PATIENT EDUCATION:  Education details: new night time garments available, importance of using a compression pump, importance of bandaging for maximal reduction [place] educated: Patient Education method: Explanation Education comprehension: verbalized understanding  HOME EXERCISE PROGRAM: Keep  bandages intact until next session and only remove if you have increased pain or numbness/tingling that does not go away when you  move  GARMENTS: pt has already used [employer] for garments, said she was measured by [employer] - emailed  Liz on 03/18/22 to verify that she did not get any garments and to look into this.    ASSESSMENT:  CLINICAL IMPRESSION: Patient is tolerating treatment sessions well She feels like she is noticing results. Patient would benefit from skilled therapy to continue to with MLD and bandaging of the arm.  OBJECTIVE IMPAIRMENTS decreased ROM and increased edema.   ACTIVITY LIMITATIONS carrying, lifting, and reach over head  PARTICIPATION LIMITATIONS:  none  PERSONAL FACTORS Time since onset of injury/illness/exacerbation are also affecting patient's functional outcome.   REHAB POTENTIAL: Good  CLINICAL DECISION MAKING: Stable/uncomplicated  EVALUATION COMPLEXITY: Low  GOALS: Goals reviewed with patient? Yes   LONG TERM GOALS: Target date: 04/13/2022    Pt will demonstrate a 2 cm reduction in edema at 10 cm proximal to olecranon process.  Baseline:  Goal status: IN PROGRESS  2.  Pt will demonstrate 165 degrees of R shoulder abduction to allow pt to reach out to the side.  Baseline: 151 Goal status: IN PROGRESS  3.  Pt will obtain appropriate day time and night time garments for long term management of R UE lymphedema.  Baseline:  Goal status: IN PROGRESS  4.  Pt will be able to independently manage her lymphedema through self MLD and compression garments.  Baseline:  Goal status: IN PROGRESS  5.  Pt will obtain a compression bra to help reduce friction on healing wound on lateral chest and to decrease swelling at lateral chest.  Baseline:  Goal status: IN PROGRESS   PLAN: PT FREQUENCY: 3x/week  PT DURATION: 4 weeks  PLANNED INTERVENTIONS: Therapeutic exercises, Therapeutic activity, Patient/Family education, Self Care, Joint mobilization, Orthotic/Fit training, Manual lymph drainage, Compression bandaging, Vasopneumatic device, Manual therapy, and Re-evaluation  PLAN FOR NEXT SESSION:  complete CDT for RUE, cont bandaging - adding finger bandaging if needed, eventually  measure for garments, pt interested in circaid profile night time garment   Markeya Mincy, Student-PT 03/24/2022, 4:53 PM

## 2022-03-25 ENCOUNTER — Ambulatory Visit: Payer: Medicare Other | Admitting: Hematology and Oncology

## 2022-03-25 ENCOUNTER — Other Ambulatory Visit: Payer: Medicare Other

## 2022-03-26 ENCOUNTER — Encounter: Payer: Self-pay | Admitting: Rehabilitation

## 2022-03-26 ENCOUNTER — Ambulatory Visit: Payer: Medicare Other | Admitting: Rehabilitation

## 2022-03-26 DIAGNOSIS — I972 Postmastectomy lymphedema syndrome: Secondary | ICD-10-CM | POA: Diagnosis not present

## 2022-03-26 DIAGNOSIS — Z853 Personal history of malignant neoplasm of breast: Secondary | ICD-10-CM | POA: Diagnosis not present

## 2022-03-26 DIAGNOSIS — M25611 Stiffness of right shoulder, not elsewhere classified: Secondary | ICD-10-CM

## 2022-03-26 NOTE — Therapy (Signed)
OUTPATIENT PHYSICAL THERAPY ONCOLOGY TREATMENT  Patient Name: Karen [facility] MRN: [id number] DOB:January 30, 1976, 46 y.o., female Today's Date: 03/26/2022   PT End of Session - 03/26/22 1054     Visit Number 4    Number of Visits 13    Date for PT Re-Evaluation 04/13/22    PT Start Time 0900    PT Stop Time 0953    PT Time Calculation (min) 53 min    Activity Tolerance Patient tolerated treatment well    Behavior During Therapy [facility] for tasks assessed/performed              Past Medical History:  Diagnosis Date   Anxiety    Arthritis    Cancer ([place]) 2011   Rt. Br. Ca   Depression    Diabetes mellitus without complication ([place])    has been "borderline"   Essential hypertension 11/05/2015   History of radiation therapy    Hypertension    under control with med., has been on med. x 2 yr.   Lymphedema of arm    right; no BP or puncture to right arm   Personal history of chemotherapy 09/16/2009   rt breast   Pneumonia    Seasonal allergies    Sinus headache    Past Surgical History:  Procedure Laterality Date   BREAST BIOPSY Right 08/26/2009   BREAST REDUCTION SURGERY Left 08/23/2013   Procedure: LEFT BREAST REDUCTION  ;  Surgeon: Theodoro Kos, DO;  Location: [place];  Service: Plastics;  Laterality: Left;   BREAST REDUCTION WITH MASTOPEXY Left 08/22/2019   Procedure: Left breast mastopexy reduction with liposuction for breast symmetry;  Surgeon: Wallace Going, DO;  Location: [place];  Service: Plastics;  Laterality: Left;   CESAREAN SECTION  07/15/2001; 03/18/2004; 11/21/2008   lateral orbiotomy Left 11/2015   Dr. Toy Cookey at [facility] Right 03/12/2013   Procedure: RIGHT BREAST LATISSIMUS FLAP WITH EXPANDER PLACEMENT;  Surgeon: Theodoro Kos, DO;  Location: [place];  Service: Plastics;  Laterality: Right;   LIPOSUCTION Bilateral 08/23/2013   Procedure: LIPOSUCTION;  Surgeon: Theodoro Kos, DO;  Location: [place];  Service: Plastics;  Laterality: Bilateral;   LIPOSUCTION WITH LIPOFILLING Left 08/22/2019   Procedure: LIPOSUCTION WITH LIPOFILLING;  Surgeon: Wallace Going, DO;  Location: [place];  Service: Plastics;  Laterality: Left;   MASTECTOMY Right 2011   MODIFIED RADICAL MASTECTOMY Right 03/11/2010   NASAL SEPTUM SURGERY     ORIF ANKLE FRACTURE Left 07/18/2018   Procedure: OPEN REDUCTION INTERNAL FIXATION (ORIF) LEFT ANKLE FRACTURE WITH  SYNDESMOSIS AND ANKLE ARTHROTOMY;  Surgeon: Erle Crocker, MD;  Location: [place];  Service: Orthopedics;  Laterality: Left;   PORT-A-CATH REMOVAL Left 12/01/2010   PORTACATH PLACEMENT Left 09/12/2009   REDUCTION MAMMAPLASTY Left 08/2013   REMOVAL OF TISSUE EXPANDER AND PLACEMENT OF IMPLANT Right 08/23/2013   Procedure: REMOVAL RIGHT TISSUE EXPANDER AND PLACEMENT OF IMPLANT TO RIGHT BREAST ;  Surgeon: Theodoro Kos, DO;  Location: [facility];  Service: Plastics;  Laterality: Right;   SUPRA-UMBILICAL HERNIA  [id number]   TUBAL LIGATION  11/21/2008   Patient Active Problem List   Diagnosis Date Noted   Carpal tunnel syndrome 10/07/2021   Genetic testing 08/14/2019   Breast asymmetry following reconstructive surgery 07/13/2019   Family history of breast cancer    Family history of prostate cancer    Unintentional weight loss 05/21/2019  Acute pyelonephritis 11/16/2018   Lymphedema of right arm 02/27/2018   Dyspnea 09/23/2016   Eczema 08/31/2016   Essential hypertension 11/05/2015   Cervical dystonia 08/25/2015   Headache 07/09/2015   Myofascial muscle pain 04/29/2015   Adhesive capsulitis of right shoulder 04/29/2015   Lower extremity edema 10/01/2014   Malignant neoplasm of overlapping sites of right breast in female, estrogen receptor positive ([place]) 05/24/2014   Myalgia and myositis 05/17/2014   Neoplasm related pain 03/12/2014   CN (constipation) 11/26/2013   H/O reduction mammoplasty  10/30/2013   Seizure ([place]) 10/06/2013   Status post bilateral breast implants 08/31/2013   S/P breast reconstruction, right 08/23/2013   AC joint arthropathy 07/02/2013   Routine adult health maintenance 05/29/2013   Hypokalemia 05/21/2013   Chronic pain 04/19/2013   Acquired absence of breast and absent nipple 01/30/2013   RHINOSINUSITIS, CHRONIC 04/08/2010   Morbid obesity ([place]) 08/04/2006   DEPRESSIVE DISORDER, NOS 08/04/2006    PCP: Chrisandra Netters, MD  REFERRING PROVIDER: [name], MD   REFERRING DIAG: I89.0 (ICD-10-CM) - Lymphedema of right arm   THERAPY DIAG:  Postmastectomy lymphedema  Stiffness of right shoulder, not elsewhere classified  History of right breast cancer  ONSET DATE: 2011  Rationale for Evaluation and Treatment Rehabilitation  SUBJECTIVE                                                                                                                                                                                           SUBJECTIVE STATEMENT: She states that the bandages didn't last long. She feels like she notices her arm getting smaller.   PERTINENT HISTORY:  Right breast cancer with mastectomy 2011 and ALND 22 nodes; reconstruction; left breast reduction; h/o right UE lymphedema and right upper quadrant pain, treated in this clinic several times before.   PAIN:  Are you having pain? Yes NPRS scale: 6/10 Pain location: R axilla Pain orientation: Right  PAIN TYPE: aching, burning, and dull Pain description: intermittent  Aggravating factors: over doing things with the arm Relieving factors: unsure  PRECAUTIONS: Other: has metal in ankle , MCL is torn  WEIGHT BEARING RESTRICTIONS No  FALLS:  Has patient fallen in last 6 months? Yes. Number of falls 1 was running from a hornet  LIVING ENVIRONMENT: Lives with:  lives with 3 sons Lives in: House/apartment Stairs: No;  Has following equipment at home: None  OCCUPATION: on  disability  LEISURE: pt has not been exercising, was doing some PT for torn MCL  HAND DOMINANCE : right   PRIOR LEVEL OF FUNCTION: Independent  PATIENT GOALS to get my arm down and  try to maintain it   OBJECTIVE  COGNITION:  Overall cognitive status: Within functional limits for tasks assessed    OBSERVATIONS / OTHER ASSESSMENTS: fullness of R upper arm when compared to L, small wound on lateral trunk from friction of R upper arm rubbing against it  POSTURE: forward head, rounded shoulders  UPPER EXTREMITY AROM/PROM:  A/PROM RIGHT   eval   Shoulder extension 53  Shoulder flexion 154  Shoulder abduction 151  Shoulder internal rotation 25  Shoulder external rotation 80    (Blank rows = not tested)  A/PROM LEFT   eval  Shoulder extension 59  Shoulder flexion 169  Shoulder abduction 176  Shoulder internal rotation 60  Shoulder external rotation 97    (Blank rows = not tested)   LYMPHEDEMA ASSESSMENTS:   SURGERY TYPE/DATE: R mastectomy and ALND in 2011  [place]: 1/20  CHEMOTHERAPY: completed  RADIATION:completed Jan 2012  HORMONE TREATMENT: anastrozole from Nov 2016 to Dec 2019  INFECTIONS: none  LYMPHEDEMA ASSESSMENTS:   LANDMARK RIGHT  eval  10 cm proximal to olecranon process 47.1  Olecranon process 32.6  10 cm proximal to ulnar styloid process 23.6  Just proximal to ulnar styloid process 17.7  Across hand at thumb web space 21.4  At base of 2nd digit 6.1  (Blank rows = not tested)  LANDMARK LEFT  eval  10 cm proximal to olecranon process 43  Olecranon process 30  10 cm proximal to ulnar styloid process 24.4  Just proximal to ulnar styloid process 17.3  Across hand at thumb web space 20.6  At base of 2nd digit 6.4  (Blank rows = not tested)   LLIS: 61.76   TODAY'S TREATMENT  10/120/23:  In supine: Short neck, 5 diaphragmatic breaths, L axillary nodes and establishment of interaxillary pathway, R inguinal nodes  and establishment of axilloinguinal pathway, then R UE working proximal to distal, moving inner upper arm outwards and upwards, and doing both sides of forearms, spending extra time in any areas of fibrosis then retracing all steps. Then work in left sidelying.  Compression bandaging as follows: TG soft from hand to axilla, rosidal from wrist to axilla, 1 6cm bandage at hand, 1 8cm bandage from wrist to axilla, 1 12 cm bandage in herringbone from wrist to axilla, 1 12 cm bandage from wrist to axilla  03/24/22:  In supine: Short neck, 5 diaphragmatic breaths, L axillary nodes and establishment of interaxillary pathway, R inguinal nodes and establishment of axilloinguinal pathway, then R UE working proximal to distal, moving inner upper arm outwards and upwards, and doing both sides of forearms, spending extra time in any areas of fibrosis then retracing all steps. Then work in left sidelying.  Compression bandaging as follows: TG soft from hand to axilla, rosidal from wrist to axilla, 1 6cm bandage at hand, 1 8cm bandage from wrist to axilla, 1 12 cm bandage in herringbone from wrist to axilla, 1 12 cm bandage from wrist to axilla  03/18/22:  In supine: Short neck, 5 diaphragmatic breaths, L axillary nodes and establishment of interaxillary pathway, R inguinal nodes and establishment of axilloinguinal pathway, then R UE working proximal to distal, moving inner upper arm outwards and upwards, and doing both sides of forearms, spending extra time in any areas of fibrosis then retracing all steps. Then work in left sidelying.  Compression bandaging as follows: TG soft from hand to axilla, rosidal from wrist to axilla, 1 6cm bandage at hand, 1 8cm  bandage from wrist to axilla, 1 12 cm bandage in herringbone from wrist to axilla, 1 12 cm bandage from wrist to axilla  03/16/22: Compression bandaging as follows: TG soft from hand to axilla, rosidal from wrist to axilla, 1 6cm bandage at hand, 1 8cm bandage from  wrist to axilla, 1 12 cm bandage in herringbone from wrist to axilla, 1 12 cm bandage from wrist to axilla  PATIENT EDUCATION:  Education details: new night time garments available, importance of using a compression pump, importance of bandaging for maximal reduction [place] educated: Patient Education method: Explanation Education comprehension: verbalized understanding  HOME EXERCISE PROGRAM: Keep bandages intact until next session and only remove if you have increased pain or numbness/tingling that does not go away when you  move  GARMENTS: pt has already used [employer] for garments, said she was measured by [facility] - emailed Liz on 03/18/22 to verify that she did not get any garments and to look into this.    ASSESSMENT:  CLINICAL IMPRESSION: Patient is tolerating treatment sessions well She feels like she is noticing results. Patient would benefit from skilled therapy to continue to with MLD and bandaging of the arm.  OBJECTIVE IMPAIRMENTS decreased ROM and increased edema.   ACTIVITY LIMITATIONS carrying, lifting, and reach over head  PARTICIPATION LIMITATIONS:  none  PERSONAL FACTORS Time since onset of injury/illness/exacerbation are also affecting patient's functional outcome.   REHAB POTENTIAL: Good  CLINICAL DECISION MAKING: Stable/uncomplicated  EVALUATION COMPLEXITY: Low  GOALS: Goals reviewed with patient? Yes   LONG TERM GOALS: Target date: 04/13/2022    Pt will demonstrate a 2 cm reduction in edema at 10 cm proximal to olecranon process.  Baseline:  Goal status: IN PROGRESS  2.  Pt will demonstrate 165 degrees of R shoulder abduction to allow pt to reach out to the side.  Baseline: 151 Goal status: IN PROGRESS  3.  Pt will obtain appropriate day time and night time garments for long term management of R UE lymphedema.  Baseline:  Goal status: IN PROGRESS  4.  Pt will be able to independently manage her lymphedema through self MLD and compression  garments.  Baseline:  Goal status: IN PROGRESS  5.  Pt will obtain a compression bra to help reduce friction on healing wound on lateral chest and to decrease swelling at lateral chest.  Baseline:  Goal status: IN PROGRESS   PLAN: PT FREQUENCY: 3x/week  PT DURATION: 4 weeks  PLANNED INTERVENTIONS: Therapeutic exercises, Therapeutic activity, Patient/Family education, Self Care, Joint mobilization, Orthotic/Fit training, Manual lymph drainage, Compression bandaging, Vasopneumatic device, Manual therapy, and Re-evaluation  PLAN FOR NEXT SESSION:  complete CDT for RUE, cont bandaging - adding finger bandaging if needed, eventually measure for garments, pt interested in circaid profile night time garment   Rayana Geurin, Adrian Prince, PT 03/26/2022, 10:54 AM

## 2022-03-29 ENCOUNTER — Ambulatory Visit: Payer: Medicare Other | Admitting: Physical Therapy

## 2022-03-29 ENCOUNTER — Encounter: Payer: Self-pay | Admitting: Physical Therapy

## 2022-03-29 DIAGNOSIS — I972 Postmastectomy lymphedema syndrome: Secondary | ICD-10-CM

## 2022-03-29 DIAGNOSIS — Z853 Personal history of malignant neoplasm of breast: Secondary | ICD-10-CM | POA: Diagnosis not present

## 2022-03-29 DIAGNOSIS — M25611 Stiffness of right shoulder, not elsewhere classified: Secondary | ICD-10-CM | POA: Diagnosis not present

## 2022-03-29 NOTE — Therapy (Signed)
OUTPATIENT PHYSICAL THERAPY ONCOLOGY TREATMENT  Patient Name: Karen Hopkins MRN: [id number] DOB:18-Jan-1976, 46 y.o., female Today's Date: 03/29/2022   PT End of Session - 03/29/22 1150     Visit Number 5    Number of Visits 13    Date for PT Re-Evaluation 04/13/22    PT Start Time 1102    PT Stop Time 1147    PT Time Calculation (min) 45 min    Activity Tolerance Patient tolerated treatment well    Behavior During Therapy [facility] for tasks assessed/performed               Past Medical History:  Diagnosis Date   Anxiety    Arthritis    Cancer ([place]) 2011   Rt. Br. Ca   Depression    Diabetes mellitus without complication ([place])    has been "borderline"   Essential hypertension 11/05/2015   History of radiation therapy    Hypertension    under control with med., has been on med. x 2 yr.   Lymphedema of arm    right; no BP or puncture to right arm   Personal history of chemotherapy 09/16/2009   rt breast   Pneumonia    Seasonal allergies    Sinus headache    Past Surgical History:  Procedure Laterality Date   BREAST BIOPSY Right 08/26/2009   BREAST REDUCTION SURGERY Left 08/23/2013   Procedure: LEFT BREAST REDUCTION  ;  Surgeon: Theodoro Kos, DO;  Location: [place];  Service: Plastics;  Laterality: Left;   BREAST REDUCTION WITH MASTOPEXY Left 08/22/2019   Procedure: Left breast mastopexy reduction with liposuction for breast symmetry;  Surgeon: Wallace Going, DO;  Location: [place];  Service: Plastics;  Laterality: Left;   CESAREAN SECTION  07/15/2001; 03/18/2004; 11/21/2008   lateral orbiotomy Left 11/2015   Dr. Toy Cookey at [place] Right 03/12/2013   Procedure: RIGHT BREAST LATISSIMUS FLAP WITH EXPANDER PLACEMENT;  Surgeon: Theodoro Kos, DO;  Location: [place];  Service: Plastics;  Laterality: Right;   LIPOSUCTION Bilateral 08/23/2013   Procedure: LIPOSUCTION;  Surgeon: Theodoro Kos, DO;  Location: [place];  Service: Plastics;  Laterality: Bilateral;   LIPOSUCTION WITH LIPOFILLING Left 08/22/2019   Procedure: LIPOSUCTION WITH LIPOFILLING;  Surgeon: Wallace Going, DO;  Location: [place];  Service: Plastics;  Laterality: Left;   MASTECTOMY Right 2011   MODIFIED RADICAL MASTECTOMY Right 03/11/2010   NASAL SEPTUM SURGERY     ORIF ANKLE FRACTURE Left 07/18/2018   Procedure: OPEN REDUCTION INTERNAL FIXATION (ORIF) LEFT ANKLE FRACTURE WITH  SYNDESMOSIS AND ANKLE ARTHROTOMY;  Surgeon: Erle Crocker, MD;  Location: [place];  Service: Orthopedics;  Laterality: Left;   PORT-A-CATH REMOVAL Left 12/01/2010   PORTACATH PLACEMENT Left 09/12/2009   REDUCTION MAMMAPLASTY Left 08/2013   REMOVAL OF TISSUE EXPANDER AND PLACEMENT OF IMPLANT Right 08/23/2013   Procedure: REMOVAL RIGHT TISSUE EXPANDER AND PLACEMENT OF IMPLANT TO RIGHT BREAST ;  Surgeon: Theodoro Kos, DO;  Location: [place];  Service: Plastics;  Laterality: Right;   SUPRA-UMBILICAL HERNIA  1027   TUBAL LIGATION  11/21/2008   Patient Active Problem List   Diagnosis Date Noted   Carpal tunnel syndrome 10/07/2021   Genetic testing 08/14/2019   Breast asymmetry following reconstructive surgery 07/13/2019   Family history of breast cancer    Family history of prostate cancer    Unintentional weight loss  05/21/2019   Acute pyelonephritis 11/16/2018   Lymphedema of right arm 02/27/2018   Dyspnea 09/23/2016   Eczema 08/31/2016   Essential hypertension 11/05/2015   Cervical dystonia 08/25/2015   Headache 07/09/2015   Myofascial muscle pain 04/29/2015   Adhesive capsulitis of right shoulder 04/29/2015   Lower extremity edema 10/01/2014   Malignant neoplasm of overlapping sites of right breast in female, estrogen receptor positive ([place]) 05/24/2014   Myalgia and myositis 05/17/2014   Neoplasm related pain 03/12/2014   CN (constipation) 11/26/2013   H/O reduction  mammoplasty 10/30/2013   Seizure ([place]) 10/06/2013   Status post bilateral breast implants 08/31/2013   S/P breast reconstruction, right 08/23/2013   AC joint arthropathy 07/02/2013   Routine adult health maintenance 05/29/2013   Hypokalemia 05/21/2013   Chronic pain 04/19/2013   Acquired absence of breast and absent nipple 01/30/2013   RHINOSINUSITIS, CHRONIC 04/08/2010   Morbid obesity ([place]) 08/04/2006   DEPRESSIVE DISORDER, NOS 08/04/2006    PCP: Chrisandra Netters, MD  REFERRING PROVIDER: [name], MD   REFERRING DIAG: I89.0 (ICD-10-CM) - Lymphedema of right arm   THERAPY DIAG:  Postmastectomy lymphedema  Stiffness of right shoulder, not elsewhere classified  History of right breast cancer  ONSET DATE: 2011  Rationale for Evaluation and Treatment Rehabilitation  SUBJECTIVE                                                                                                                                                                                           SUBJECTIVE STATEMENT: My bandages fell off yesterday and I wore my old sleeve.  PERTINENT HISTORY:  Right breast cancer with mastectomy 2011 and ALND 22 nodes; reconstruction; left breast reduction; h/o right UE lymphedema and right upper quadrant pain, treated in this clinic several times before.   PAIN:  Are you having pain? Yes NPRS scale: 6/10 Pain location: R lateral trunk Pain orientation: Right  PAIN TYPE: aching, burning, and dull Pain description: intermittent  Aggravating factors: over doing things with the arm Relieving factors: unsure  PRECAUTIONS: Other: has metal in ankle , MCL is torn  WEIGHT BEARING RESTRICTIONS No  FALLS:  Has patient fallen in last 6 months? Yes. Number of falls 1 was running from a hornet  LIVING ENVIRONMENT: Lives with:  lives with 3 sons Lives in: House/apartment Stairs: No;  Has following equipment at home: None  OCCUPATION: on disability  LEISURE:  pt has not been exercising, was doing some PT for torn MCL  HAND DOMINANCE : right   PRIOR LEVEL OF FUNCTION: Independent  PATIENT GOALS to get my arm down and try to maintain  it   OBJECTIVE  COGNITION:  Overall cognitive status: Within functional limits for tasks assessed    OBSERVATIONS / OTHER ASSESSMENTS: fullness of R upper arm when compared to L, small wound on lateral trunk from friction of R upper arm rubbing against it  POSTURE: forward head, rounded shoulders  UPPER EXTREMITY AROM/PROM:  A/PROM RIGHT   eval   Shoulder extension 53  Shoulder flexion 154  Shoulder abduction 151  Shoulder internal rotation 25  Shoulder external rotation 80    (Blank rows = not tested)  A/PROM LEFT   eval  Shoulder extension 59  Shoulder flexion 169  Shoulder abduction 176  Shoulder internal rotation 60  Shoulder external rotation 97    (Blank rows = not tested)   LYMPHEDEMA ASSESSMENTS:   SURGERY TYPE/DATE: R mastectomy and ALND in 2011  NUMBER OF LYMPH NODES REMOVED: 1/20  CHEMOTHERAPY: completed  RADIATION:completed Jan 2012  HORMONE TREATMENT: anastrozole from Nov 2016 to Dec 2019  INFECTIONS: none  LYMPHEDEMA ASSESSMENTS:   LANDMARK RIGHT  eval  10 cm proximal to olecranon process 47.1  Olecranon process 32.6  10 cm proximal to ulnar styloid process 23.6  Just proximal to ulnar styloid process 17.7  Across hand at thumb web space 21.4  At base of 2nd digit 6.1  (Blank rows = not tested)  LANDMARK LEFT  eval  10 cm proximal to olecranon process 43  Olecranon process 30  10 cm proximal to ulnar styloid process 24.4  Just proximal to ulnar styloid process 17.3  Across hand at thumb web space 20.6  At base of 2nd digit 6.4  (Blank rows = not tested)   LLIS: 61.76   TODAY'S TREATMENT  03/29/22:  STM: to R serratus and lats using cocoa butter to help decrease muscle tightness and decrease spasms Compression bandaging as follows: TG soft from  hand to axilla, rosidal from wrist to axilla, 1 6cm bandage at hand, 1 8cm bandage from wrist to axilla, 1 12 cm bandage in herringbone from wrist to axilla, 1 12 cm bandage from wrist to axilla  10/120/23:  In supine: Short neck, 5 diaphragmatic breaths, L axillary nodes and establishment of interaxillary pathway, R inguinal nodes and establishment of axilloinguinal pathway, then R UE working proximal to distal, moving inner upper arm outwards and upwards, and doing both sides of forearms, spending extra time in any areas of fibrosis then retracing all steps. Then work in left sidelying.  Compression bandaging as follows: TG soft from hand to axilla, rosidal from wrist to axilla, 1 6cm bandage at hand, 1 8cm bandage from wrist to axilla, 1 12 cm bandage in herringbone from wrist to axilla, 1 12 cm bandage from wrist to axilla  03/24/22:  In supine: Short neck, 5 diaphragmatic breaths, L axillary nodes and establishment of interaxillary pathway, R inguinal nodes and establishment of axilloinguinal pathway, then R UE working proximal to distal, moving inner upper arm outwards and upwards, and doing both sides of forearms, spending extra time in any areas of fibrosis then retracing all steps. Then work in left sidelying.  Compression bandaging as follows: TG soft from hand to axilla, rosidal from wrist to axilla, 1 6cm bandage at hand, 1 8cm bandage from wrist to axilla, 1 12 cm bandage in herringbone from wrist to axilla, 1 12 cm bandage from wrist to axilla  03/18/22:  In supine: Short neck, 5 diaphragmatic breaths, L axillary nodes and establishment of interaxillary pathway, R inguinal nodes and establishment of  axilloinguinal pathway, then R UE working proximal to distal, moving inner upper arm outwards and upwards, and doing both sides of forearms, spending extra time in any areas of fibrosis then retracing all steps. Then work in left sidelying.  Compression bandaging as follows: TG soft from hand to  axilla, rosidal from wrist to axilla, 1 6cm bandage at hand, 1 8cm bandage from wrist to axilla, 1 12 cm bandage in herringbone from wrist to axilla, 1 12 cm bandage from wrist to axilla  03/16/22: Compression bandaging as follows: TG soft from hand to axilla, rosidal from wrist to axilla, 1 6cm bandage at hand, 1 8cm bandage from wrist to axilla, 1 12 cm bandage in herringbone from wrist to axilla, 1 12 cm bandage from wrist to axilla  PATIENT EDUCATION:  Education details: new night time garments available, importance of using a compression pump, importance of bandaging for maximal reduction [place] educated: Patient Education method: Explanation Education comprehension: verbalized understanding  HOME EXERCISE PROGRAM: Keep bandages intact until next session and only remove if you have increased pain or numbness/tingling that does not go away when you  move  GARMENTS: pt has already used [employer] for garments, said she was measured by [employer] - emailed Liz on 03/18/22 to verify that she did not get any garments and to look into this.    ASSESSMENT:  CLINICAL IMPRESSION: Pt reports continued pain in area of serratus on the R side. She reports this area often spasms and especially when reaching backwards so discussed soft tissue mobilization with pt and she preferred to focus on this instead of MLD since this area has been bothering her. She had increased muscle tightness and "knotty" texture. Pt had great improvement with tightness and discomfort by end of session. She reports it felt totally different. Continued with compression bandaging at end of session.   OBJECTIVE IMPAIRMENTS decreased ROM and increased edema.   ACTIVITY LIMITATIONS carrying, lifting, and reach over head  PARTICIPATION LIMITATIONS:  none  PERSONAL FACTORS Time since onset of injury/illness/exacerbation are also affecting patient's functional outcome.   REHAB POTENTIAL: Good  CLINICAL DECISION MAKING:  Stable/uncomplicated  EVALUATION COMPLEXITY: Low  GOALS: Goals reviewed with patient? Yes   LONG TERM GOALS: Target date: 04/13/2022    Pt will demonstrate a 2 cm reduction in edema at 10 cm proximal to olecranon process.  Baseline:  Goal status: IN PROGRESS  2.  Pt will demonstrate 165 degrees of R shoulder abduction to allow pt to reach out to the side.  Baseline: 151 Goal status: IN PROGRESS  3.  Pt will obtain appropriate day time and night time garments for long term management of R UE lymphedema.  Baseline:  Goal status: IN PROGRESS  4.  Pt will be able to independently manage her lymphedema through self MLD and compression garments.  Baseline:  Goal status: IN PROGRESS  5.  Pt will obtain a compression bra to help reduce friction on healing wound on lateral chest and to decrease swelling at lateral chest.  Baseline:  Goal status: IN PROGRESS   PLAN: PT FREQUENCY: 3x/week  PT DURATION: 4 weeks  PLANNED INTERVENTIONS: Therapeutic exercises, Therapeutic activity, Patient/Family education, Self Care, Joint mobilization, Orthotic/Fit training, Manual lymph drainage, Compression bandaging, Vasopneumatic device, Manual therapy, and Re-evaluation  PLAN FOR NEXT SESSION:  complete CDT for RUE, cont bandaging - adding finger bandaging if needed, eventually measure for garments, pt interested in circaid profile night time garment   [employer], PT 03/29/2022,  11:58 AM

## 2022-03-31 ENCOUNTER — Encounter: Payer: Self-pay | Admitting: Physical Therapy

## 2022-03-31 ENCOUNTER — Ambulatory Visit: Payer: Medicare Other | Admitting: Physical Therapy

## 2022-03-31 DIAGNOSIS — M25611 Stiffness of right shoulder, not elsewhere classified: Secondary | ICD-10-CM | POA: Diagnosis not present

## 2022-03-31 DIAGNOSIS — Z853 Personal history of malignant neoplasm of breast: Secondary | ICD-10-CM | POA: Diagnosis not present

## 2022-03-31 DIAGNOSIS — I972 Postmastectomy lymphedema syndrome: Secondary | ICD-10-CM

## 2022-03-31 NOTE — Therapy (Signed)
OUTPATIENT PHYSICAL THERAPY ONCOLOGY TREATMENT  Patient Name: Karen Hopkins MRN: [id number] DOB:02-09-1976, 46 y.o., female Today's Date: 03/31/2022   PT End of Session - 03/31/22 1007     Visit Number 6    Number of Visits 13    Date for PT Re-Evaluation 04/13/22    PT Start Time 1006    PT Stop Time 1054    PT Time Calculation (min) 48 min    Activity Tolerance Patient tolerated treatment well    Behavior During Therapy [facility] for tasks assessed/performed               Past Medical History:  Diagnosis Date   Anxiety    Arthritis    Cancer ([place]) 2011   Rt. Br. Ca   Depression    Diabetes mellitus without complication ([place])    has been "borderline"   Essential hypertension 11/05/2015   History of radiation therapy    Hypertension    under control with med., has been on med. x 2 yr.   Lymphedema of arm    right; no BP or puncture to right arm   Personal history of chemotherapy 09/16/2009   rt breast   Pneumonia    Seasonal allergies    Sinus headache    Past Surgical History:  Procedure Laterality Date   BREAST BIOPSY Right 08/26/2009   BREAST REDUCTION SURGERY Left 08/23/2013   Procedure: LEFT BREAST REDUCTION  ;  Surgeon: Theodoro Kos, DO;  Location: [place];  Service: Plastics;  Laterality: Left;   BREAST REDUCTION WITH MASTOPEXY Left 08/22/2019   Procedure: Left breast mastopexy reduction with liposuction for breast symmetry;  Surgeon: Wallace Going, DO;  Location: [place];  Service: Plastics;  Laterality: Left;   CESAREAN SECTION  07/15/2001; 03/18/2004; 11/21/2008   lateral orbiotomy Left 11/2015   Dr. Toy Cookey at [place] Right 03/12/2013   Procedure: RIGHT BREAST LATISSIMUS FLAP WITH EXPANDER PLACEMENT;  Surgeon: Theodoro Kos, DO;  Location: [place];  Service: Plastics;  Laterality: Right;   LIPOSUCTION Bilateral 08/23/2013   Procedure: LIPOSUCTION;  Surgeon: Theodoro Kos, DO;  Location: [place];  Service: Plastics;  Laterality: Bilateral;   LIPOSUCTION WITH LIPOFILLING Left 08/22/2019   Procedure: LIPOSUCTION WITH LIPOFILLING;  Surgeon: Wallace Going, DO;  Location: [place];  Service: Plastics;  Laterality: Left;   MASTECTOMY Right 2011   MODIFIED RADICAL MASTECTOMY Right 03/11/2010   NASAL SEPTUM SURGERY     ORIF ANKLE FRACTURE Left 07/18/2018   Procedure: OPEN REDUCTION INTERNAL FIXATION (ORIF) LEFT ANKLE FRACTURE WITH  SYNDESMOSIS AND ANKLE ARTHROTOMY;  Surgeon: Erle Crocker, MD;  Location: [place];  Service: Orthopedics;  Laterality: Left;   PORT-A-CATH REMOVAL Left 12/01/2010   PORTACATH PLACEMENT Left 09/12/2009   REDUCTION MAMMAPLASTY Left 08/2013   REMOVAL OF TISSUE EXPANDER AND PLACEMENT OF IMPLANT Right 08/23/2013   Procedure: REMOVAL RIGHT TISSUE EXPANDER AND PLACEMENT OF IMPLANT TO RIGHT BREAST ;  Surgeon: Theodoro Kos, DO;  Location: [place];  Service: Plastics;  Laterality: Right;   SUPRA-UMBILICAL HERNIA  [id number]   TUBAL LIGATION  11/21/2008   Patient Active Problem List   Diagnosis Date Noted   Carpal tunnel syndrome 10/07/2021   Genetic testing 08/14/2019   Breast asymmetry following reconstructive surgery 07/13/2019   Family history of breast cancer    Family history of prostate cancer    Unintentional weight loss  05/21/2019   Acute pyelonephritis 11/16/2018   Lymphedema of right arm 02/27/2018   Dyspnea 09/23/2016   Eczema 08/31/2016   Essential hypertension 11/05/2015   Cervical dystonia 08/25/2015   Headache 07/09/2015   Myofascial muscle pain 04/29/2015   Adhesive capsulitis of right shoulder 04/29/2015   Lower extremity edema 10/01/2014   Malignant neoplasm of overlapping sites of right breast in female, estrogen receptor positive ([place]) 05/24/2014   Myalgia and myositis 05/17/2014   Neoplasm related pain 03/12/2014   CN (constipation) 11/26/2013   H/O reduction  mammoplasty 10/30/2013   Seizure ([place]) 10/06/2013   Status post bilateral breast implants 08/31/2013   S/P breast reconstruction, right 08/23/2013   AC joint arthropathy 07/02/2013   Routine adult health maintenance 05/29/2013   Hypokalemia 05/21/2013   Chronic pain 04/19/2013   Acquired absence of breast and absent nipple 01/30/2013   RHINOSINUSITIS, CHRONIC 04/08/2010   Morbid obesity ([place]) 08/04/2006   DEPRESSIVE DISORDER, NOS 08/04/2006    PCP: Chrisandra Netters, MD  REFERRING PROVIDER: [name], MD   REFERRING DIAG: I89.0 (ICD-10-CM) - Lymphedema of right arm   THERAPY DIAG:  Postmastectomy lymphedema  Stiffness of right shoulder, not elsewhere classified  History of right breast cancer  ONSET DATE: 2011  Rationale for Evaluation and Treatment Rehabilitation  SUBJECTIVE                                                                                                                                                                                           SUBJECTIVE STATEMENT: My bandages came off last night.   PERTINENT HISTORY:  Right breast cancer with mastectomy 2011 and ALND 22 nodes; reconstruction; left breast reduction; h/o right UE lymphedema and right upper quadrant pain, treated in this clinic several times before.   PAIN:  Are you having pain? Yes NPRS scale: 5/10 Pain location: R lateral trunk Pain orientation: Right  PAIN TYPE: aching, burning, and dull Pain description: intermittent  Aggravating factors: over doing things with the arm Relieving factors: unsure  PRECAUTIONS: Other: has metal in ankle , MCL is torn  WEIGHT BEARING RESTRICTIONS No  FALLS:  Has patient fallen in last 6 months? Yes. Number of falls 1 was running from a hornet  LIVING ENVIRONMENT: Lives with:  lives with 3 sons Lives in: House/apartment Stairs: No;  Has following equipment at home: None  OCCUPATION: on disability  LEISURE: pt has not been  exercising, was doing some PT for torn MCL  HAND DOMINANCE : right   PRIOR LEVEL OF FUNCTION: Independent  PATIENT GOALS to get my arm down and try to maintain it   OBJECTIVE  COGNITION:  Overall cognitive status: Within functional limits for tasks assessed    OBSERVATIONS / OTHER ASSESSMENTS: fullness of R upper arm when compared to L, small wound on lateral trunk from friction of R upper arm rubbing against it  POSTURE: forward head, rounded shoulders  UPPER EXTREMITY AROM/PROM:  A/PROM RIGHT   eval   Shoulder extension 53  Shoulder flexion 154  Shoulder abduction 151  Shoulder internal rotation 25  Shoulder external rotation 80    (Blank rows = not tested)  A/PROM LEFT   eval  Shoulder extension 59  Shoulder flexion 169  Shoulder abduction 176  Shoulder internal rotation 60  Shoulder external rotation 97    (Blank rows = not tested)   LYMPHEDEMA ASSESSMENTS:   SURGERY TYPE/DATE: R mastectomy and ALND in 2011  [place]: 1/20  CHEMOTHERAPY: completed  RADIATION:completed Jan 2012  HORMONE TREATMENT: anastrozole from Nov 2016 to Dec 2019  INFECTIONS: none  LYMPHEDEMA ASSESSMENTS:   LANDMARK RIGHT  eval RIGHT 03/31/22  10 cm proximal to olecranon process 47.1 47.5  Olecranon process 32.6 33.5  10 cm proximal to ulnar styloid process 23.6 24.1  Just proximal to ulnar styloid process 17.7 17.7  Across hand at thumb web space 21.4 21  At base of 2nd digit 6.1 6  (Blank rows = not tested)  LANDMARK LEFT  eval  10 cm proximal to olecranon process 43  Olecranon process 30  10 cm proximal to ulnar styloid process 24.4  Just proximal to ulnar styloid process 17.3  Across hand at thumb web space 20.6  At base of 2nd digit 6.4  (Blank rows = not tested)   LLIS: 61.76   TODAY'S TREATMENT  03/29/22:  In supine: Short neck, 5 diaphragmatic breaths, L axillary nodes and establishment of interaxillary pathway, R inguinal nodes  and establishment of axilloinguinal pathway, then R UE working proximal to distal, moving inner upper arm outwards and upwards, and doing both sides of forearms, spending extra time in any areas of fibrosis then retracing all steps.  Compression bandaging as follows: TG soft from hand to axilla, rosidal from wrist to axilla, 1 6cm bandage at hand, 1 8cm bandage from wrist to axilla, 1 12 cm bandage in herringbone from wrist to axilla, 1 12 cm bandage from wrist to axilla  03/29/22:  STM: to R serratus and lats using cocoa butter to help decrease muscle tightness and decrease spasms Compression bandaging as follows: TG soft from hand to axilla, rosidal from wrist to axilla, 1 6cm bandage at hand, 1 8cm bandage from wrist to axilla, 1 12 cm bandage in herringbone from wrist to axilla, 1 12 cm bandage from wrist to axilla  10/120/23:  In supine: Short neck, 5 diaphragmatic breaths, L axillary nodes and establishment of interaxillary pathway, R inguinal nodes and establishment of axilloinguinal pathway, then R UE working proximal to distal, moving inner upper arm outwards and upwards, and doing both sides of forearms, spending extra time in any areas of fibrosis then retracing all steps. Then work in left sidelying.  Compression bandaging as follows: TG soft from hand to axilla, rosidal from wrist to axilla, 1 6cm bandage at hand, 1 8cm bandage from wrist to axilla, 1 12 cm bandage in herringbone from wrist to axilla, 1 12 cm bandage from wrist to axilla  03/24/22:  In supine: Short neck, 5 diaphragmatic breaths, L axillary nodes and establishment of interaxillary pathway, R inguinal nodes and establishment of axilloinguinal pathway,  then R UE working proximal to distal, moving inner upper arm outwards and upwards, and doing both sides of forearms, spending extra time in any areas of fibrosis then retracing all steps. Then work in left sidelying.  Compression bandaging as follows: TG soft from hand to  axilla, rosidal from wrist to axilla, 1 6cm bandage at hand, 1 8cm bandage from wrist to axilla, 1 12 cm bandage in herringbone from wrist to axilla, 1 12 cm bandage from wrist to axilla  03/18/22:  In supine: Short neck, 5 diaphragmatic breaths, L axillary nodes and establishment of interaxillary pathway, R inguinal nodes and establishment of axilloinguinal pathway, then R UE working proximal to distal, moving inner upper arm outwards and upwards, and doing both sides of forearms, spending extra time in any areas of fibrosis then retracing all steps. Then work in left sidelying.  Compression bandaging as follows: TG soft from hand to axilla, rosidal from wrist to axilla, 1 6cm bandage at hand, 1 8cm bandage from wrist to axilla, 1 12 cm bandage in herringbone from wrist to axilla, 1 12 cm bandage from wrist to axilla  03/16/22: Compression bandaging as follows: TG soft from hand to axilla, rosidal from wrist to axilla, 1 6cm bandage at hand, 1 8cm bandage from wrist to axilla, 1 12 cm bandage in herringbone from wrist to axilla, 1 12 cm bandage from wrist to axilla  PATIENT EDUCATION:  Education details: new night time garments available, importance of using a compression pump, importance of bandaging for maximal reduction Inabinet educated: Patient Education method: Explanation Education comprehension: verbalized understanding  HOME EXERCISE PROGRAM: Keep bandages intact until next session and only remove if you have increased pain or numbness/tingling that does not go away when you  move  GARMENTS: pt has already used [employer] for garments, said she was measured by [employer] - emailed Liz on 03/18/22 to verify that she did not get any garments and to look into this.    ASSESSMENT:  CLINICAL IMPRESSION: Remeasured circumferences today and pt does not demonstrate any reduction but she does demonstrate softening of her fibrosis. Continued with MLD today followed by compression bandaging. She did  have relief of pain following STM to R serratus and lats yesterday.   OBJECTIVE IMPAIRMENTS decreased ROM and increased edema.   ACTIVITY LIMITATIONS carrying, lifting, and reach over head  PARTICIPATION LIMITATIONS:  none  PERSONAL FACTORS Time since onset of injury/illness/exacerbation are also affecting patient's functional outcome.   REHAB POTENTIAL: Good  CLINICAL DECISION MAKING: Stable/uncomplicated  EVALUATION COMPLEXITY: Low  GOALS: Goals reviewed with patient? Yes   LONG TERM GOALS: Target date: 04/13/2022    Pt will demonstrate a 2 cm reduction in edema at 10 cm proximal to olecranon process.  Baseline:  Goal status: IN PROGRESS  2.  Pt will demonstrate 165 degrees of R shoulder abduction to allow pt to reach out to the side.  Baseline: 151 Goal status: IN PROGRESS  3.  Pt will obtain appropriate day time and night time garments for long term management of R UE lymphedema.  Baseline:  Goal status: IN PROGRESS  4.  Pt will be able to independently manage her lymphedema through self MLD and compression garments.  Baseline:  Goal status: IN PROGRESS  5.  Pt will obtain a compression bra to help reduce friction on healing wound on lateral chest and to decrease swelling at lateral chest.  Baseline:  Goal status: IN PROGRESS   PLAN: PT FREQUENCY: 3x/week  PT  DURATION: 4 weeks  PLANNED INTERVENTIONS: Therapeutic exercises, Therapeutic activity, Patient/Family education, Self Care, Joint mobilization, Orthotic/Fit training, Manual lymph drainage, Compression bandaging, Vasopneumatic device, Manual therapy, and Re-evaluation  PLAN FOR NEXT SESSION:  complete CDT for RUE, cont bandaging - adding finger bandaging if needed, eventually measure for garments, pt interested in circaid profile night time garment   [employer], PT 03/31/2022, 11:11 AM

## 2022-04-02 ENCOUNTER — Ambulatory Visit: Payer: Medicare Other | Admitting: Physical Therapy

## 2022-04-02 DIAGNOSIS — H40023 Open angle with borderline findings, high risk, bilateral: Secondary | ICD-10-CM | POA: Diagnosis not present

## 2022-04-05 ENCOUNTER — Encounter: Payer: Self-pay | Admitting: Rehabilitation

## 2022-04-05 ENCOUNTER — Ambulatory Visit: Payer: Medicare Other | Admitting: Rehabilitation

## 2022-04-05 DIAGNOSIS — I972 Postmastectomy lymphedema syndrome: Secondary | ICD-10-CM

## 2022-04-05 DIAGNOSIS — M25611 Stiffness of right shoulder, not elsewhere classified: Secondary | ICD-10-CM

## 2022-04-05 DIAGNOSIS — Z853 Personal history of malignant neoplasm of breast: Secondary | ICD-10-CM | POA: Diagnosis not present

## 2022-04-05 NOTE — Therapy (Signed)
OUTPATIENT PHYSICAL THERAPY ONCOLOGY TREATMENT  Patient Name: Karen Hopkins MRN: [id number] DOB:1976-04-26, 46 y.o., female Today's Date: 04/05/2022   PT End of Session - 04/05/22 1110     Visit Number 7    Number of Visits 13    Date for PT Re-Evaluation 04/13/22    PT Start Time 1011   late   PT Stop Time 1100    PT Time Calculation (min) 49 min    Activity Tolerance Patient tolerated treatment well    Behavior During Therapy [facility] for tasks assessed/performed               Past Medical History:  Diagnosis Date   Anxiety    Arthritis    Cancer ([place]) 2011   Rt. Br. Ca   Depression    Diabetes mellitus without complication ([place])    has been "borderline"   Essential hypertension 11/05/2015   History of radiation therapy    Hypertension    under control with med., has been on med. x 2 yr.   Lymphedema of arm    right; no BP or puncture to right arm   Personal history of chemotherapy 09/16/2009   rt breast   Pneumonia    Seasonal allergies    Sinus headache    Past Surgical History:  Procedure Laterality Date   BREAST BIOPSY Right 08/26/2009   BREAST REDUCTION SURGERY Left 08/23/2013   Procedure: LEFT BREAST REDUCTION  ;  Surgeon: Theodoro Kos, DO;  Location: [place];  Service: Plastics;  Laterality: Left;   BREAST REDUCTION WITH MASTOPEXY Left 08/22/2019   Procedure: Left breast mastopexy reduction with liposuction for breast symmetry;  Surgeon: Wallace Going, DO;  Location: [place];  Service: Plastics;  Laterality: Left;   CESAREAN SECTION  07/15/2001; 03/18/2004; 11/21/2008   lateral orbiotomy Left 11/2015   Dr. Toy Cookey at [place] Right 03/12/2013   Procedure: RIGHT BREAST LATISSIMUS FLAP WITH EXPANDER PLACEMENT;  Surgeon: Theodoro Kos, DO;  Location: [place];  Service: Plastics;  Laterality: Right;   LIPOSUCTION Bilateral 08/23/2013   Procedure: LIPOSUCTION;  Surgeon: Theodoro Kos, DO;  Location:  [place];  Service: Plastics;  Laterality: Bilateral;   LIPOSUCTION WITH LIPOFILLING Left 08/22/2019   Procedure: LIPOSUCTION WITH LIPOFILLING;  Surgeon: Wallace Going, DO;  Location: [place];  Service: Plastics;  Laterality: Left;   MASTECTOMY Right 2011   MODIFIED RADICAL MASTECTOMY Right 03/11/2010   NASAL SEPTUM SURGERY     ORIF ANKLE FRACTURE Left 07/18/2018   Procedure: OPEN REDUCTION INTERNAL FIXATION (ORIF) LEFT ANKLE FRACTURE WITH  SYNDESMOSIS AND ANKLE ARTHROTOMY;  Surgeon: Erle Crocker, MD;  Location: [place];  Service: Orthopedics;  Laterality: Left;   PORT-A-CATH REMOVAL Left 12/01/2010   PORTACATH PLACEMENT Left 09/12/2009   REDUCTION MAMMAPLASTY Left 08/2013   REMOVAL OF TISSUE EXPANDER AND PLACEMENT OF IMPLANT Right 08/23/2013   Procedure: REMOVAL RIGHT TISSUE EXPANDER AND PLACEMENT OF IMPLANT TO RIGHT BREAST ;  Surgeon: Theodoro Kos, DO;  Location: [place];  Service: Plastics;  Laterality: Right;   SUPRA-UMBILICAL HERNIA  4008   TUBAL LIGATION  11/21/2008   Patient Active Problem List   Diagnosis Date Noted   Carpal tunnel syndrome 10/07/2021   Genetic testing 08/14/2019   Breast asymmetry following reconstructive surgery 07/13/2019   Family history of breast cancer    Family history of prostate cancer    Unintentional  weight loss 05/21/2019   Acute pyelonephritis 11/16/2018   Lymphedema of right arm 02/27/2018   Dyspnea 09/23/2016   Eczema 08/31/2016   Essential hypertension 11/05/2015   Cervical dystonia 08/25/2015   Headache 07/09/2015   Myofascial muscle pain 04/29/2015   Adhesive capsulitis of right shoulder 04/29/2015   Lower extremity edema 10/01/2014   Malignant neoplasm of overlapping sites of right breast in female, estrogen receptor positive ([place]) 05/24/2014   Myalgia and myositis 05/17/2014   Neoplasm related pain 03/12/2014   CN (constipation) 11/26/2013   H/O reduction  mammoplasty 10/30/2013   Seizure ([place]) 10/06/2013   Status post bilateral breast implants 08/31/2013   S/P breast reconstruction, right 08/23/2013   AC joint arthropathy 07/02/2013   Routine adult health maintenance 05/29/2013   Hypokalemia 05/21/2013   Chronic pain 04/19/2013   Acquired absence of breast and absent nipple 01/30/2013   RHINOSINUSITIS, CHRONIC 04/08/2010   Morbid obesity ([place]) 08/04/2006   DEPRESSIVE DISORDER, NOS 08/04/2006    PCP: Chrisandra Netters, MD  REFERRING PROVIDER: [name], MD   REFERRING DIAG: I89.0 (ICD-10-CM) - Lymphedema of right arm   THERAPY DIAG:  Postmastectomy lymphedema  Stiffness of right shoulder, not elsewhere classified  History of right breast cancer  ONSET DATE: 2011  Rationale for Evaluation and Treatment Rehabilitation  SUBJECTIVE                                                                                                                                                                                           SUBJECTIVE STATEMENT: Just tired today.  Bandages have been off since Friday.  On Monday I have a hernia surgery.  I can see what happens.  I took a muscle relaxer today.    PERTINENT HISTORY:  Right breast cancer with mastectomy 2011 and ALND 22 nodes; reconstruction; left breast reduction; h/o right UE lymphedema and right upper quadrant pain, treated in this clinic several times before.   PAIN:  Are you having pain? YES NPRS scale: 4/10 Pain location: R lateral trunk Pain orientation: Right  PAIN TYPE: aching, burning, and dull Pain description: intermittent  Aggravating factors: over doing things with the arm Relieving factors: unsure  PRECAUTIONS: Other: has metal in ankle , MCL is torn  WEIGHT BEARING RESTRICTIONS No  FALLS:  Has patient fallen in last 6 months? Yes. Number of falls 1 was running from a hornet  LIVING ENVIRONMENT: Lives with:  lives with 3 sons Lives in:  House/apartment Stairs: No;  Has following equipment at home: None  OCCUPATION: on disability  LEISURE: pt has not been exercising, was doing some PT for torn MCL  HAND DOMINANCE : right   PRIOR LEVEL OF FUNCTION: Independent  PATIENT GOALS to get my arm down and try to maintain it   OBJECTIVE  COGNITION:  Overall cognitive status: Within functional limits for tasks assessed   OBSERVATIONS / OTHER ASSESSMENTS: fullness of R upper arm when compared to L, small wound on lateral trunk from friction of R upper arm rubbing against it  POSTURE: forward head, rounded shoulders  UPPER EXTREMITY AROM/PROM:  A/PROM RIGHT   eval   Shoulder extension 53  Shoulder flexion 154  Shoulder abduction 151  Shoulder internal rotation 25  Shoulder external rotation 80    (Blank rows = not tested)  A/PROM LEFT   eval  Shoulder extension 59  Shoulder flexion 169  Shoulder abduction 176  Shoulder internal rotation 60  Shoulder external rotation 97    (Blank rows = not tested)   LYMPHEDEMA ASSESSMENTS:  SURGERY TYPE/DATE: R mastectomy and ALND in 2011 [place]: 1/20 CHEMOTHERAPY: completed RADIATION:completed Jan 2012 HORMONE TREATMENT: anastrozole from Nov 2016 to Dec 2019 INFECTIONS: none  LYMPHEDEMA ASSESSMENTS:  LANDMARK RIGHT  eval RIGHT 03/31/22  10 cm proximal to olecranon process 47.1 47.5  Olecranon process 32.6 33.5  10 cm proximal to ulnar styloid process 23.6 24.1  Just proximal to ulnar styloid process 17.7 17.7  Across hand at thumb web space 21.4 21  At base of 2nd digit 6.1 6  (Blank rows = not tested)  LANDMARK LEFT  eval  10 cm proximal to olecranon process 43  Olecranon process 30  10 cm proximal to ulnar styloid process 24.4  Just proximal to ulnar styloid process 17.3  Across hand at thumb web space 20.6  At base of 2nd digit 6.4  (Blank rows = not tested)   LLIS: 61.76   TODAY'S TREATMENT  04/05/22:  In supine: Short  neck, 5 diaphragmatic breaths, L axillary nodes and establishment of interaxillary pathway, R inguinal nodes and establishment of axilloinguinal pathway, then R UE working proximal to distal, moving inner upper arm outwards and upwards, and doing both sides of forearms, spending extra time in any areas of fibrosis then retracing all steps.  Compression bandaging as follows: TG soft from hand to axilla, artiflex from wrist to axilla (foam was left in patient's car), 1 6cm bandage at hand, 1 8cm bandage from wrist to axilla, 1 12 cm bandage in herringbone from wrist to axilla, 1 12 cm bandage from wrist to axilla  03/29/22:  STM: to R serratus and lats using cocoa butter to help decrease muscle tightness and decrease spasms Compression bandaging as follows: TG soft from hand to axilla, rosidal from wrist to axilla, 1 6cm bandage at hand, 1 8cm bandage from wrist to axilla, 1 12 cm bandage in herringbone from wrist to axilla, 1 12 cm bandage from wrist to axilla  10/120/23:  In supine: Short neck, 5 diaphragmatic breaths, L axillary nodes and establishment of interaxillary pathway, R inguinal nodes and establishment of axilloinguinal pathway, then R UE working proximal to distal, moving inner upper arm outwards and upwards, and doing both sides of forearms, spending extra time in any areas of fibrosis then retracing all steps. Then work in left sidelying.  Compression bandaging as follows: TG soft from hand to axilla, rosidal from wrist to axilla, 1 6cm bandage at hand, 1 8cm bandage from wrist to axilla, 1 12 cm bandage in herringbone from wrist to axilla, 1 12 cm bandage from wrist to axilla  03/24/22:  In supine: Short neck, 5 diaphragmatic breaths, L axillary nodes and establishment of interaxillary pathway, R inguinal nodes and establishment of axilloinguinal pathway, then R UE working proximal to distal, moving inner upper arm outwards and upwards, and doing both sides of forearms, spending extra time  in any areas of fibrosis then retracing all steps. Then work in left sidelying.  Compression bandaging as follows: TG soft from hand to axilla, rosidal from wrist to axilla, 1 6cm bandage at hand, 1 8cm bandage from wrist to axilla, 1 12 cm bandage in herringbone from wrist to axilla, 1 12 cm bandage from wrist to axilla  03/18/22:  In supine: Short neck, 5 diaphragmatic breaths, L axillary nodes and establishment of interaxillary pathway, R inguinal nodes and establishment of axilloinguinal pathway, then R UE working proximal to distal, moving inner upper arm outwards and upwards, and doing both sides of forearms, spending extra time in any areas of fibrosis then retracing all steps. Then work in left sidelying.  Compression bandaging as follows: TG soft from hand to axilla, rosidal from wrist to axilla, 1 6cm bandage at hand, 1 8cm bandage from wrist to axilla, 1 12 cm bandage in herringbone from wrist to axilla, 1 12 cm bandage from wrist to axilla  03/16/22: Compression bandaging as follows: TG soft from hand to axilla, rosidal from wrist to axilla, 1 6cm bandage at hand, 1 8cm bandage from wrist to axilla, 1 12 cm bandage in herringbone from wrist to axilla, 1 12 cm bandage from wrist to axilla  PATIENT EDUCATION:  Education details: new night time garments available, importance of using a compression pump, importance of bandaging for maximal reduction [place] educated: Patient Education method: Explanation Education comprehension: verbalized understanding  HOME EXERCISE PROGRAM: Keep bandages intact until next session and only remove if you have increased pain or numbness/tingling that does not go away when you  move  GARMENTS: pt has already used [employer] for garments, said she was measured by [facility] - emailed Liz on 03/18/22 to verify that she did not get any garments and to look into this.  No coverage with [employer] or [employer].   ASSESSMENT:  CLINICAL IMPRESSION: Pt knows that  when she is ready for garments she will have to be self pay.  Would like to plan for after surgery.   OBJECTIVE IMPAIRMENTS decreased ROM and increased edema.   ACTIVITY LIMITATIONS carrying, lifting, and reach over head  PARTICIPATION LIMITATIONS:  none  PERSONAL FACTORS Time since onset of injury/illness/exacerbation are also affecting patient's functional outcome.   REHAB POTENTIAL: Good  CLINICAL DECISION MAKING: Stable/uncomplicated  EVALUATION COMPLEXITY: Low  GOALS: Goals reviewed with patient? Yes   LONG TERM GOALS: Target date: 04/13/2022    Pt will demonstrate a 2 cm reduction in edema at 10 cm proximal to olecranon process.  Baseline:  Goal status: IN PROGRESS  2.  Pt will demonstrate 165 degrees of R shoulder abduction to allow pt to reach out to the side.  Baseline: 151 Goal status: IN PROGRESS  3.  Pt will obtain appropriate day time and night time garments for long term management of R UE lymphedema.  Baseline:  Goal status: IN PROGRESS  4.  Pt will be able to independently manage her lymphedema through self MLD and compression garments.  Baseline:  Goal status: IN PROGRESS  5.  Pt will obtain a compression bra to help reduce friction on healing wound on lateral chest and to decrease swelling at lateral  chest.  Baseline:  Goal status: IN PROGRESS   PLAN: PT FREQUENCY: 3x/week  PT DURATION: 4 weeks  PLANNED INTERVENTIONS: Therapeutic exercises, Therapeutic activity, Patient/Family education, Self Care, Joint mobilization, Orthotic/Fit training, Manual lymph drainage, Compression bandaging, Vasopneumatic device, Manual therapy, and Re-evaluation  PLAN FOR NEXT SESSION:  complete CDT for RUE, cont bandaging - adding finger bandaging if needed, eventually measure for garments, pt interested in circaid profile night time garment   Daleyssa Loiselle, Adrian Prince, PT 04/05/2022, 8:40 PM

## 2022-04-07 ENCOUNTER — Ambulatory Visit: Payer: Medicare Other | Attending: Family Medicine | Admitting: Physical Therapy

## 2022-04-07 ENCOUNTER — Encounter: Payer: Self-pay | Admitting: Physical Therapy

## 2022-04-07 ENCOUNTER — Other Ambulatory Visit: Payer: Self-pay | Admitting: *Deleted

## 2022-04-07 DIAGNOSIS — M25611 Stiffness of right shoulder, not elsewhere classified: Secondary | ICD-10-CM | POA: Insufficient documentation

## 2022-04-07 DIAGNOSIS — Z853 Personal history of malignant neoplasm of breast: Secondary | ICD-10-CM | POA: Insufficient documentation

## 2022-04-07 DIAGNOSIS — I972 Postmastectomy lymphedema syndrome: Secondary | ICD-10-CM | POA: Diagnosis not present

## 2022-04-07 DIAGNOSIS — Z170 Estrogen receptor positive status [ER+]: Secondary | ICD-10-CM

## 2022-04-07 NOTE — Therapy (Addendum)
 OUTPATIENT PHYSICAL THERAPY ONCOLOGY TREATMENT  Patient Name: Karen Hopkins MRN: [id number] DOB:1976-06-04, 46 y.o., female Today's Date: 04/07/2022   PT End of Session - 04/07/22 1007     Visit Number 8    Number of Visits 13    Date for PT Re-Evaluation 04/13/22    PT Start Time 1006    PT Stop Time 1050    PT Time Calculation (min) 44 min    Activity Tolerance Patient tolerated treatment well    Behavior During Therapy [facility] for tasks assessed/performed               Past Medical History:  Diagnosis Date   Anxiety    Arthritis    Cancer (HCC) 2011   Rt. Br. Ca   Depression    Diabetes mellitus without complication (HCC)    has been "borderline"   Essential hypertension 11/05/2015   History of radiation therapy    Hypertension    under control with med., has been on med. x 2 yr.   Lymphedema of arm    right; no BP or puncture to right arm   Personal history of chemotherapy 09/16/2009   rt breast   Pneumonia    Seasonal allergies    Sinus headache    Past Surgical History:  Procedure Laterality Date   BREAST BIOPSY Right 08/26/2009   BREAST REDUCTION SURGERY Left 08/23/2013   Procedure: LEFT BREAST REDUCTION  ;  Surgeon: Wayland Denis, DO;  Location: [facility];  Service: Plastics;  Laterality: Left;   BREAST REDUCTION WITH MASTOPEXY Left 08/22/2019   Procedure: Left breast mastopexy reduction with liposuction for breast symmetry;  Surgeon: Peggye Form, DO;  Location: [facility];  Service: Plastics;  Laterality: Left;   CESAREAN SECTION  07/15/2001; 03/18/2004; 11/21/2008   lateral orbiotomy Left 11/2015   Dr. Toni Arthurs at [facility]   LATISSIMUS FLAP TO BREAST Right 03/12/2013   Procedure: RIGHT BREAST LATISSIMUS FLAP WITH EXPANDER PLACEMENT;  Surgeon: Wayland Denis, DO;  Location: [facility];  Service: Plastics;  Laterality: Right;   LIPOSUCTION Bilateral 08/23/2013   Procedure: LIPOSUCTION;  Surgeon: Wayland Denis, DO;  Location: [facility];  Service: Plastics;  Laterality: Bilateral;   LIPOSUCTION WITH LIPOFILLING Left 08/22/2019   Procedure: LIPOSUCTION WITH LIPOFILLING;  Surgeon: Peggye Form, DO;  Location: [facility];  Service: Plastics;  Laterality: Left;   MASTECTOMY Right 2011   MODIFIED RADICAL MASTECTOMY Right 03/11/2010   NASAL SEPTUM SURGERY     ORIF ANKLE FRACTURE Left 07/18/2018   Procedure: OPEN REDUCTION INTERNAL FIXATION (ORIF) LEFT ANKLE FRACTURE WITH  SYNDESMOSIS AND ANKLE ARTHROTOMY;  Surgeon: Terance Hart, MD;  Location: [facility];  Service: Orthopedics;  Laterality: Left;   PORT-A-CATH REMOVAL Left 12/01/2010   PORTACATH PLACEMENT Left 09/12/2009   REDUCTION MAMMAPLASTY Left 08/2013   REMOVAL OF TISSUE EXPANDER AND PLACEMENT OF IMPLANT Right 08/23/2013   Procedure: REMOVAL RIGHT TISSUE EXPANDER AND PLACEMENT OF IMPLANT TO RIGHT BREAST ;  Surgeon: Wayland Denis, DO;  Location: [facility];  Service: Plastics;  Laterality: Right;   SUPRA-UMBILICAL HERNIA  2006   TUBAL LIGATION  11/21/2008   Patient Active Problem List   Diagnosis Date Noted   Carpal tunnel syndrome 10/07/2021   Genetic testing 08/14/2019   Breast asymmetry following reconstructive surgery 07/13/2019   Family history of breast cancer    Family history of prostate cancer    Unintentional weight loss  05/21/2019   Acute pyelonephritis 11/16/2018   Lymphedema of right arm 02/27/2018   Dyspnea 09/23/2016   Eczema 08/31/2016   Essential hypertension 11/05/2015   Cervical dystonia 08/25/2015   Headache 07/09/2015   Myofascial muscle pain 04/29/2015   Adhesive capsulitis of right shoulder 04/29/2015   Lower extremity edema 10/01/2014   Malignant neoplasm of overlapping sites of right breast in female, estrogen receptor positive (HCC) 05/24/2014   Myalgia and myositis 05/17/2014   Neoplasm related pain 03/12/2014   CN (constipation) 11/26/2013   H/O reduction  mammoplasty 10/30/2013   Seizure (HCC) 10/06/2013   Status post bilateral breast implants 08/31/2013   S/P breast reconstruction, right 08/23/2013   AC joint arthropathy 07/02/2013   Routine adult health maintenance 05/29/2013   Hypokalemia 05/21/2013   Chronic pain 04/19/2013   Acquired absence of breast and absent nipple 01/30/2013   RHINOSINUSITIS, CHRONIC 04/08/2010   Morbid obesity (HCC) 08/04/2006   DEPRESSIVE DISORDER, NOS 08/04/2006    PCP: Levert Feinstein, MD  REFERRING PROVIDER: Latrelle Dodrill, MD   REFERRING DIAG: I89.0 (ICD-10-CM) - Lymphedema of right arm   THERAPY DIAG:  Postmastectomy lymphedema  Stiffness of right shoulder, not elsewhere classified  History of right breast cancer  ONSET DATE: 2011  Rationale for Evaluation and Treatment Rehabilitation  SUBJECTIVE                                                                                                                                                                                           SUBJECTIVE STATEMENT: I took the bandages off last night because they were getting loose.   PERTINENT HISTORY:  Right breast cancer with mastectomy 2011 and ALND 22 nodes; reconstruction; left breast reduction; h/o right UE lymphedema and right upper quadrant pain, treated in this clinic several times before.   PAIN:  Are you having pain? No not right now NPRS scale: 0/10 Pain location: R lateral trunk Pain orientation: Right  PAIN TYPE: aching, burning, and dull Pain description: intermittent  Aggravating factors: over doing things with the arm Relieving factors: unsure  PRECAUTIONS: Other: has metal in ankle , MCL is torn  WEIGHT BEARING RESTRICTIONS No  FALLS:  Has patient fallen in last 6 months? Yes. Number of falls 1 was running from a hornet  LIVING ENVIRONMENT: Lives with:  lives with 3 sons Lives in: House/apartment Stairs: No;  Has following equipment at home: None  OCCUPATION: on  disability  LEISURE: pt has not been exercising, was doing some PT for torn MCL  HAND DOMINANCE : right   PRIOR LEVEL OF FUNCTION: Independent  PATIENT GOALS to get my arm  down and try to maintain it   OBJECTIVE  COGNITION:  Overall cognitive status: Within functional limits for tasks assessed   OBSERVATIONS / OTHER ASSESSMENTS: fullness of R upper arm when compared to L, small wound on lateral trunk from friction of R upper arm rubbing against it  POSTURE: forward head, rounded shoulders  UPPER EXTREMITY AROM/PROM:  A/PROM RIGHT   eval   Shoulder extension 53  Shoulder flexion 154  Shoulder abduction 151  Shoulder internal rotation 25  Shoulder external rotation 80    (Blank rows = not tested)  A/PROM LEFT   eval  Shoulder extension 59  Shoulder flexion 169  Shoulder abduction 176  Shoulder internal rotation 60  Shoulder external rotation 97    (Blank rows = not tested)   LYMPHEDEMA ASSESSMENTS:  SURGERY TYPE/DATE: R mastectomy and ALND in 2011 NUMBER OF LYMPH NODES REMOVED: 1/20 CHEMOTHERAPY: completed RADIATION:completed Jan 2012 HORMONE TREATMENT: anastrozole from Nov 2016 to Dec 2019 INFECTIONS: none  LYMPHEDEMA ASSESSMENTS:  LANDMARK RIGHT  eval RIGHT 03/31/22  10 cm proximal to olecranon process 47.1 47.5  Olecranon process 32.6 33.5  10 cm proximal to ulnar styloid process 23.6 24.1  Just proximal to ulnar styloid process 17.7 17.7  Across hand at thumb web space 21.4 21  At base of 2nd digit 6.1 6  (Blank rows = not tested)  LANDMARK LEFT  eval  10 cm proximal to olecranon process 43  Olecranon process 30  10 cm proximal to ulnar styloid process 24.4  Just proximal to ulnar styloid process 17.3  Across hand at thumb web space 20.6  At base of 2nd digit 6.4  (Blank rows = not tested)   LLIS: 61.76   TODAY'S TREATMENT  04/07/22:  In supine: Short neck, 5 diaphragmatic breaths, L axillary nodes and establishment of interaxillary  pathway, R inguinal nodes and establishment of axilloinguinal pathway, then R UE working proximal to distal, moving inner upper arm outwards and upwards, and doing both sides of forearms, spending extra time in any areas of fibrosis then retracing all steps.  Compression bandaging as follows: TG soft from hand to axilla, rosidal from wrist to axilla, 1 6cm bandage at hand, 1 8cm bandage from wrist to axilla, 1 12 cm bandage in herringbone from wrist to axilla, 1 12 cm bandage from wrist to axilla  04/05/22:  In supine: Short neck, 5 diaphragmatic breaths, L axillary nodes and establishment of interaxillary pathway, R inguinal nodes and establishment of axilloinguinal pathway, then R UE working proximal to distal, moving inner upper arm outwards and upwards, and doing both sides of forearms, spending extra time in any areas of fibrosis then retracing all steps.  Compression bandaging as follows: TG soft from hand to axilla, artiflex from wrist to axilla (foam was left in patient's car), 1 6cm bandage at hand, 1 8cm bandage from wrist to axilla, 1 12 cm bandage in herringbone from wrist to axilla, 1 12 cm bandage from wrist to axilla  03/29/22:  STM: to R serratus and lats using cocoa butter to help decrease muscle tightness and decrease spasms Compression bandaging as follows: TG soft from hand to axilla, rosidal from wrist to axilla, 1 6cm bandage at hand, 1 8cm bandage from wrist to axilla, 1 12 cm bandage in herringbone from wrist to axilla, 1 12 cm bandage from wrist to axilla  10/120/23:  In supine: Short neck, 5 diaphragmatic breaths, L axillary nodes and establishment of interaxillary pathway, R inguinal nodes and establishment  of axilloinguinal pathway, then R UE working proximal to distal, moving inner upper arm outwards and upwards, and doing both sides of forearms, spending extra time in any areas of fibrosis then retracing all steps. Then work in left sidelying.  Compression bandaging as  follows: TG soft from hand to axilla, rosidal from wrist to axilla, 1 6cm bandage at hand, 1 8cm bandage from wrist to axilla, 1 12 cm bandage in herringbone from wrist to axilla, 1 12 cm bandage from wrist to axilla  03/24/22:  In supine: Short neck, 5 diaphragmatic breaths, L axillary nodes and establishment of interaxillary pathway, R inguinal nodes and establishment of axilloinguinal pathway, then R UE working proximal to distal, moving inner upper arm outwards and upwards, and doing both sides of forearms, spending extra time in any areas of fibrosis then retracing all steps. Then work in left sidelying.  Compression bandaging as follows: TG soft from hand to axilla, rosidal from wrist to axilla, 1 6cm bandage at hand, 1 8cm bandage from wrist to axilla, 1 12 cm bandage in herringbone from wrist to axilla, 1 12 cm bandage from wrist to axilla  03/18/22:  In supine: Short neck, 5 diaphragmatic breaths, L axillary nodes and establishment of interaxillary pathway, R inguinal nodes and establishment of axilloinguinal pathway, then R UE working proximal to distal, moving inner upper arm outwards and upwards, and doing both sides of forearms, spending extra time in any areas of fibrosis then retracing all steps. Then work in left sidelying.  Compression bandaging as follows: TG soft from hand to axilla, rosidal from wrist to axilla, 1 6cm bandage at hand, 1 8cm bandage from wrist to axilla, 1 12 cm bandage in herringbone from wrist to axilla, 1 12 cm bandage from wrist to axilla  03/16/22: Compression bandaging as follows: TG soft from hand to axilla, rosidal from wrist to axilla, 1 6cm bandage at hand, 1 8cm bandage from wrist to axilla, 1 12 cm bandage in herringbone from wrist to axilla, 1 12 cm bandage from wrist to axilla  PATIENT EDUCATION:  Education details: new night time garments available, importance of using a compression pump, importance of bandaging for maximal reduction [place] educated:  Patient Education method: Explanation Education comprehension: verbalized understanding  HOME EXERCISE PROGRAM: Keep bandages intact until next session and only remove if you have increased pain or numbness/tingling that does not go away when you  move  GARMENTS: pt has already used [employer] for garments, said she was measured by [employer] - emailed Liz on 03/18/22 to verify that she did not get any garments and to look into this.  No coverage with [employer] or [employer].   ASSESSMENT:  CLINICAL IMPRESSION: Continued with complete CDT today. Once pt reaches maximal reduction she would like to get a compression sleeve (custom) but would like to wait until after her surgery on Monday.   OBJECTIVE IMPAIRMENTS decreased ROM and increased edema.   ACTIVITY LIMITATIONS carrying, lifting, and reach over head  PARTICIPATION LIMITATIONS:  none  PERSONAL FACTORS Time since onset of injury/illness/exacerbation are also affecting patient's functional outcome.   REHAB POTENTIAL: Good  CLINICAL DECISION MAKING: Stable/uncomplicated  EVALUATION COMPLEXITY: Low  GOALS: Goals reviewed with patient? Yes   LONG TERM GOALS: Target date: 04/13/2022    Pt will demonstrate a 2 cm reduction in edema at 10 cm proximal to olecranon process.  Baseline:  Goal status: IN PROGRESS  2.  Pt will demonstrate 165 degrees of R shoulder abduction to  allow pt to reach out to the side.  Baseline: 151 Goal status: IN PROGRESS  3.  Pt will obtain appropriate day time and night time garments for long term management of R UE lymphedema.  Baseline:  Goal status: IN PROGRESS  4.  Pt will be able to independently manage her lymphedema through self MLD and compression garments.  Baseline:  Goal status: IN PROGRESS  5.  Pt will obtain a compression bra to help reduce friction on healing wound on lateral chest and to decrease swelling at lateral chest.  Baseline:  Goal status: IN PROGRESS   PLAN: PT  FREQUENCY: 3x/week  PT DURATION: 4 weeks  PLANNED INTERVENTIONS: Therapeutic exercises, Therapeutic activity, Patient/Family education, Self Care, Joint mobilization, Orthotic/Fit training, Manual lymph drainage, Compression bandaging, Vasopneumatic device, Manual therapy, and Re-evaluation  PLAN FOR NEXT SESSION:  complete CDT for RUE, cont bandaging - adding finger bandaging if needed, eventually measure for garments, pt interested in circaid profile night time garment   [employer], PT 04/07/2022, 10:56 AM   PHYSICAL THERAPY DISCHARGE SUMMARY  Visits from Start of Care: 8  Current functional level related to goals / functional outcomes: See above   Remaining deficits: See above   Education / Equipment: MLD   Patient agrees to discharge. Patient goals were not met. Patient is being discharged due to not returning since the last visit.  [facility] [place], [place] 08/23/23 10:50 AM

## 2022-04-08 ENCOUNTER — Inpatient Hospital Stay: Payer: Medicare Other | Attending: Family Medicine

## 2022-04-08 ENCOUNTER — Inpatient Hospital Stay: Payer: Medicare Other | Admitting: Hematology and Oncology

## 2022-04-09 ENCOUNTER — Ambulatory Visit: Payer: Medicare Other | Admitting: Physical Therapy

## 2022-04-12 ENCOUNTER — Ambulatory Visit: Payer: Medicare Other | Admitting: Physical Therapy

## 2022-04-12 DIAGNOSIS — K430 Incisional hernia with obstruction, without gangrene: Secondary | ICD-10-CM | POA: Diagnosis not present

## 2022-04-12 DIAGNOSIS — K420 Umbilical hernia with obstruction, without gangrene: Secondary | ICD-10-CM | POA: Diagnosis not present

## 2022-04-12 HISTORY — PX: UMBILICAL HERNIA REPAIR: SHX196

## 2022-04-14 ENCOUNTER — Ambulatory Visit: Payer: Medicare Other | Admitting: Physical Therapy

## 2022-04-16 ENCOUNTER — Ambulatory Visit: Payer: Medicare Other

## 2022-04-19 ENCOUNTER — Ambulatory Visit: Payer: Medicare Other | Admitting: Physical Therapy

## 2022-04-21 ENCOUNTER — Ambulatory Visit: Payer: Medicare Other | Admitting: Rehabilitation

## 2022-04-21 NOTE — Therapy (Deleted)
OUTPATIENT PHYSICAL THERAPY ONCOLOGY TREATMENT  Patient Name: Karen Hopkins MRN: [id number] DOB:1975/08/16, 46 y.o., female Today's Date: 04/21/2022       Past Medical History:  Diagnosis Date   Anxiety    Arthritis    Cancer ([place]) 2011   Rt. Br. Ca   Depression    Diabetes mellitus without complication ([place])    has been "borderline"   Essential hypertension 11/05/2015   History of radiation therapy    Hypertension    under control with med., has been on med. x 2 yr.   Lymphedema of arm    right; no BP or puncture to right arm   Personal history of chemotherapy 09/16/2009   rt breast   Pneumonia    Seasonal allergies    Sinus headache    Past Surgical History:  Procedure Laterality Date   BREAST BIOPSY Right 08/26/2009   BREAST REDUCTION SURGERY Left 08/23/2013   Procedure: LEFT BREAST REDUCTION  ;  Surgeon: Theodoro Kos, DO;  Location: [address];  Service: Plastics;  Laterality: Left;   BREAST REDUCTION WITH MASTOPEXY Left 08/22/2019   Procedure: Left breast mastopexy reduction with liposuction for breast symmetry;  Surgeon: Wallace Going, DO;  Location: [place];  Service: Plastics;  Laterality: Left;   CESAREAN SECTION  07/15/2001; 03/18/2004; 11/21/2008   lateral orbiotomy Left 11/2015   Dr. Toy Cookey at [place] Right 03/12/2013   Procedure: RIGHT BREAST LATISSIMUS FLAP WITH EXPANDER PLACEMENT;  Surgeon: Theodoro Kos, DO;  Location: [place];  Service: Plastics;  Laterality: Right;   LIPOSUCTION Bilateral 08/23/2013   Procedure: LIPOSUCTION;  Surgeon: Theodoro Kos, DO;  Location: [place];  Service: Plastics;  Laterality: Bilateral;   LIPOSUCTION WITH LIPOFILLING Left 08/22/2019   Procedure: LIPOSUCTION WITH LIPOFILLING;  Surgeon: Wallace Going, DO;  Location: [place];  Service: Plastics;  Laterality: Left;   MASTECTOMY Right 2011   MODIFIED RADICAL MASTECTOMY Right  03/11/2010   NASAL SEPTUM SURGERY     ORIF ANKLE FRACTURE Left 07/18/2018   Procedure: OPEN REDUCTION INTERNAL FIXATION (ORIF) LEFT ANKLE FRACTURE WITH  SYNDESMOSIS AND ANKLE ARTHROTOMY;  Surgeon: Erle Crocker, MD;  Location: [place];  Service: Orthopedics;  Laterality: Left;   PORT-A-CATH REMOVAL Left 12/01/2010   PORTACATH PLACEMENT Left 09/12/2009   REDUCTION MAMMAPLASTY Left 08/2013   REMOVAL OF TISSUE EXPANDER AND PLACEMENT OF IMPLANT Right 08/23/2013   Procedure: REMOVAL RIGHT TISSUE EXPANDER AND PLACEMENT OF IMPLANT TO RIGHT BREAST ;  Surgeon: Theodoro Kos, DO;  Location: [place];  Service: Plastics;  Laterality: Right;   SUPRA-UMBILICAL HERNIA  [id number]   TUBAL LIGATION  11/21/2008   Patient Active Problem List   Diagnosis Date Noted   Carpal tunnel syndrome 10/07/2021   Genetic testing 08/14/2019   Breast asymmetry following reconstructive surgery 07/13/2019   Family history of breast cancer    Family history of prostate cancer    Unintentional weight loss 05/21/2019   Acute pyelonephritis 11/16/2018   Lymphedema of right arm 02/27/2018   Dyspnea 09/23/2016   Eczema 08/31/2016   Essential hypertension 11/05/2015   Cervical dystonia 08/25/2015   Headache 07/09/2015   Myofascial muscle pain 04/29/2015   Adhesive capsulitis of right shoulder 04/29/2015   Lower extremity edema 10/01/2014   Malignant neoplasm of overlapping sites of right breast in female, estrogen receptor positive ([place]) 05/24/2014   Myalgia and myositis 05/17/2014   Neoplasm  related pain 03/12/2014   CN (constipation) 11/26/2013   H/O reduction mammoplasty 10/30/2013   Seizure ([place]) 10/06/2013   Status post bilateral breast implants 08/31/2013   S/P breast reconstruction, right 08/23/2013   AC joint arthropathy 07/02/2013   Routine adult health maintenance 05/29/2013   Hypokalemia 05/21/2013   Chronic pain 04/19/2013   Acquired absence of breast and absent nipple  01/30/2013   RHINOSINUSITIS, CHRONIC 04/08/2010   Morbid obesity ([place]) 08/04/2006   DEPRESSIVE DISORDER, NOS 08/04/2006    PCP: Chrisandra Netters, MD  REFERRING PROVIDER: [name], MD   REFERRING DIAG: I89.0 (ICD-10-CM) - Lymphedema of right arm   THERAPY DIAG:  No diagnosis found.  ONSET DATE: 2011  Rationale for Evaluation and Treatment Rehabilitation  SUBJECTIVE                                                                                                                                                                                           SUBJECTIVE STATEMENT: I took the bandages off last night because they were getting loose.   PERTINENT HISTORY:  Right breast cancer with mastectomy 2011 and ALND 22 nodes; reconstruction; left breast reduction; h/o right UE lymphedema and right upper quadrant pain, treated in this clinic several times before.   PAIN:  Are you having pain? No not right now NPRS scale: 0/10 Pain location: R lateral trunk Pain orientation: Right  PAIN TYPE: aching, burning, and dull Pain description: intermittent  Aggravating factors: over doing things with the arm Relieving factors: unsure  PRECAUTIONS: Other: has metal in ankle , MCL is torn  WEIGHT BEARING RESTRICTIONS No  FALLS:  Has patient fallen in last 6 months? Yes. Number of falls 1 was running from a hornet  LIVING ENVIRONMENT: Lives with:  lives with 3 sons Lives in: House/apartment Stairs: No;  Has following equipment at home: None  OCCUPATION: on disability  LEISURE: pt has not been exercising, was doing some PT for torn MCL  HAND DOMINANCE : right   PRIOR LEVEL OF FUNCTION: Independent  PATIENT GOALS to get my arm down and try to maintain it   OBJECTIVE  COGNITION:  Overall cognitive status: Within functional limits for tasks assessed   OBSERVATIONS / OTHER ASSESSMENTS: fullness of R upper arm when compared to L, small wound on lateral trunk from  friction of R upper arm rubbing against it  POSTURE: forward head, rounded shoulders  UPPER EXTREMITY AROM/PROM:  A/PROM RIGHT   eval   Shoulder extension 53  Shoulder flexion 154  Shoulder abduction 151  Shoulder internal rotation 25  Shoulder external rotation 80    (Blank rows =  not tested)  A/PROM LEFT   eval  Shoulder extension 59  Shoulder flexion 169  Shoulder abduction 176  Shoulder internal rotation 60  Shoulder external rotation 97    (Blank rows = not tested)   LYMPHEDEMA ASSESSMENTS:  SURGERY TYPE/DATE: R mastectomy and ALND in 2011 [place]: 1/20 CHEMOTHERAPY: completed RADIATION:completed Jan 2012 HORMONE TREATMENT: anastrozole from Nov 2016 to Dec 2019 INFECTIONS: none  LYMPHEDEMA ASSESSMENTS:  LANDMARK RIGHT  eval RIGHT 03/31/22  10 cm proximal to olecranon process 47.1 47.5  Olecranon process 32.6 33.5  10 cm proximal to ulnar styloid process 23.6 24.1  Just proximal to ulnar styloid process 17.7 17.7  Across hand at thumb web space 21.4 21  At base of 2nd digit 6.1 6  (Blank rows = not tested)  LANDMARK LEFT  eval  10 cm proximal to olecranon process 43  Olecranon process 30  10 cm proximal to ulnar styloid process 24.4  Just proximal to ulnar styloid process 17.3  Across hand at thumb web space 20.6  At base of 2nd digit 6.4  (Blank rows = not tested)   LLIS: 61.76   TODAY'S TREATMENT  04/07/22:  In supine: Short neck, 5 diaphragmatic breaths, L axillary nodes and establishment of interaxillary pathway, R inguinal nodes and establishment of axilloinguinal pathway, then R UE working proximal to distal, moving inner upper arm outwards and upwards, and doing both sides of forearms, spending extra time in any areas of fibrosis then retracing all steps.  Compression bandaging as follows: TG soft from hand to axilla, rosidal from wrist to axilla, 1 6cm bandage at hand, 1 8cm bandage from wrist to axilla, 1 12 cm bandage in  herringbone from wrist to axilla, 1 12 cm bandage from wrist to axilla  04/05/22:  In supine: Short neck, 5 diaphragmatic breaths, L axillary nodes and establishment of interaxillary pathway, R inguinal nodes and establishment of axilloinguinal pathway, then R UE working proximal to distal, moving inner upper arm outwards and upwards, and doing both sides of forearms, spending extra time in any areas of fibrosis then retracing all steps.  Compression bandaging as follows: TG soft from hand to axilla, artiflex from wrist to axilla (foam was left in patient's car), 1 6cm bandage at hand, 1 8cm bandage from wrist to axilla, 1 12 cm bandage in herringbone from wrist to axilla, 1 12 cm bandage from wrist to axilla  03/29/22:  STM: to R serratus and lats using cocoa butter to help decrease muscle tightness and decrease spasms Compression bandaging as follows: TG soft from hand to axilla, rosidal from wrist to axilla, 1 6cm bandage at hand, 1 8cm bandage from wrist to axilla, 1 12 cm bandage in herringbone from wrist to axilla, 1 12 cm bandage from wrist to axilla  10/120/23:  In supine: Short neck, 5 diaphragmatic breaths, L axillary nodes and establishment of interaxillary pathway, R inguinal nodes and establishment of axilloinguinal pathway, then R UE working proximal to distal, moving inner upper arm outwards and upwards, and doing both sides of forearms, spending extra time in any areas of fibrosis then retracing all steps. Then work in left sidelying.  Compression bandaging as follows: TG soft from hand to axilla, rosidal from wrist to axilla, 1 6cm bandage at hand, 1 8cm bandage from wrist to axilla, 1 12 cm bandage in herringbone from wrist to axilla, 1 12 cm bandage from wrist to axilla  03/24/22:  In supine: Short neck, 5 diaphragmatic  breaths, L axillary nodes and establishment of interaxillary pathway, R inguinal nodes and establishment of axilloinguinal pathway, then R UE working proximal to  distal, moving inner upper arm outwards and upwards, and doing both sides of forearms, spending extra time in any areas of fibrosis then retracing all steps. Then work in left sidelying.  Compression bandaging as follows: TG soft from hand to axilla, rosidal from wrist to axilla, 1 6cm bandage at hand, 1 8cm bandage from wrist to axilla, 1 12 cm bandage in herringbone from wrist to axilla, 1 12 cm bandage from wrist to axilla  03/18/22:  In supine: Short neck, 5 diaphragmatic breaths, L axillary nodes and establishment of interaxillary pathway, R inguinal nodes and establishment of axilloinguinal pathway, then R UE working proximal to distal, moving inner upper arm outwards and upwards, and doing both sides of forearms, spending extra time in any areas of fibrosis then retracing all steps. Then work in left sidelying.  Compression bandaging as follows: TG soft from hand to axilla, rosidal from wrist to axilla, 1 6cm bandage at hand, 1 8cm bandage from wrist to axilla, 1 12 cm bandage in herringbone from wrist to axilla, 1 12 cm bandage from wrist to axilla  03/16/22: Compression bandaging as follows: TG soft from hand to axilla, rosidal from wrist to axilla, 1 6cm bandage at hand, 1 8cm bandage from wrist to axilla, 1 12 cm bandage in herringbone from wrist to axilla, 1 12 cm bandage from wrist to axilla  PATIENT EDUCATION:  Education details: new night time garments available, importance of using a compression pump, importance of bandaging for maximal reduction [place] educated: Patient Education method: Explanation Education comprehension: verbalized understanding  HOME EXERCISE PROGRAM: Keep bandages intact until next session and only remove if you have increased pain or numbness/tingling that does not go away when you  move  GARMENTS: pt has already used [employer] for garments, said she was measured by [facility] - emailed Liz on 03/18/22 to verify that she did not get any garments and to look  into this.  No coverage with [employer] or [employer].   ASSESSMENT:  CLINICAL IMPRESSION: Continued with complete CDT today. Once pt reaches maximal reduction she would like to get a compression sleeve (custom) but would like to wait until after her surgery on Monday.   OBJECTIVE IMPAIRMENTS decreased ROM and increased edema.   ACTIVITY LIMITATIONS carrying, lifting, and reach over head  PARTICIPATION LIMITATIONS:  none  PERSONAL FACTORS Time since onset of injury/illness/exacerbation are also affecting patient's functional outcome.   REHAB POTENTIAL: Good  CLINICAL DECISION MAKING: Stable/uncomplicated  EVALUATION COMPLEXITY: Low  GOALS: Goals reviewed with patient? Yes   LONG TERM GOALS: Target date: 04/13/2022    Pt will demonstrate a 2 cm reduction in edema at 10 cm proximal to olecranon process.  Baseline:  Goal status: IN PROGRESS  2.  Pt will demonstrate 165 degrees of R shoulder abduction to allow pt to reach out to the side.  Baseline: 151 Goal status: IN PROGRESS  3.  Pt will obtain appropriate day time and night time garments for long term management of R UE lymphedema.  Baseline:  Goal status: IN PROGRESS  4.  Pt will be able to independently manage her lymphedema through self MLD and compression garments.  Baseline:  Goal status: IN PROGRESS  5.  Pt will obtain a compression bra to help reduce friction on healing wound on lateral chest and to decrease swelling at lateral chest.  Baseline:  Goal status: IN PROGRESS   PLAN: PT FREQUENCY: 3x/week  PT DURATION: 4 weeks  PLANNED INTERVENTIONS: Therapeutic exercises, Therapeutic activity, Patient/Family education, Self Care, Joint mobilization, Orthotic/Fit training, Manual lymph drainage, Compression bandaging, Vasopneumatic device, Manual therapy, and Re-evaluation  PLAN FOR NEXT SESSION:  complete CDT for RUE, cont bandaging - adding finger bandaging if needed, eventually measure for garments, pt  interested in circaid profile night time garment   Janie Capp, Student-PT 04/21/2022, 9:02 AM

## 2022-04-23 ENCOUNTER — Encounter: Payer: Medicare Other | Admitting: Rehabilitation

## 2022-04-23 DIAGNOSIS — H40023 Open angle with borderline findings, high risk, bilateral: Secondary | ICD-10-CM | POA: Diagnosis not present

## 2022-04-26 ENCOUNTER — Encounter: Payer: Medicare Other | Admitting: Physical Therapy

## 2022-04-28 ENCOUNTER — Encounter: Payer: Medicare Other | Admitting: Rehabilitation

## 2022-05-21 DIAGNOSIS — Z9889 Other specified postprocedural states: Secondary | ICD-10-CM | POA: Diagnosis not present

## 2022-05-21 DIAGNOSIS — Z8719 Personal history of other diseases of the digestive system: Secondary | ICD-10-CM | POA: Diagnosis not present

## 2022-07-28 DIAGNOSIS — M542 Cervicalgia: Secondary | ICD-10-CM | POA: Diagnosis not present

## 2022-07-28 DIAGNOSIS — I972 Postmastectomy lymphedema syndrome: Secondary | ICD-10-CM | POA: Diagnosis not present

## 2022-07-28 DIAGNOSIS — M62838 Other muscle spasm: Secondary | ICD-10-CM | POA: Diagnosis not present

## 2022-07-28 DIAGNOSIS — R0789 Other chest pain: Secondary | ICD-10-CM | POA: Diagnosis not present

## 2022-08-26 ENCOUNTER — Encounter: Payer: Self-pay | Admitting: Oncology

## 2022-09-23 ENCOUNTER — Other Ambulatory Visit: Payer: Self-pay

## 2022-09-23 ENCOUNTER — Ambulatory Visit (INDEPENDENT_AMBULATORY_CARE_PROVIDER_SITE_OTHER): Payer: 59 | Admitting: Family Medicine

## 2022-09-23 ENCOUNTER — Encounter: Payer: Self-pay | Admitting: Family Medicine

## 2022-09-23 VITALS — BP 129/91 | HR 96 | Ht 64.0 in | Wt 249.6 lb

## 2022-09-23 DIAGNOSIS — R5383 Other fatigue: Secondary | ICD-10-CM | POA: Diagnosis not present

## 2022-09-23 DIAGNOSIS — R0602 Shortness of breath: Secondary | ICD-10-CM

## 2022-09-23 DIAGNOSIS — I10 Essential (primary) hypertension: Secondary | ICD-10-CM

## 2022-09-23 DIAGNOSIS — A048 Other specified bacterial intestinal infections: Secondary | ICD-10-CM | POA: Diagnosis not present

## 2022-09-23 DIAGNOSIS — J302 Other seasonal allergic rhinitis: Secondary | ICD-10-CM | POA: Diagnosis not present

## 2022-09-23 DIAGNOSIS — K429 Umbilical hernia without obstruction or gangrene: Secondary | ICD-10-CM | POA: Diagnosis not present

## 2022-09-23 MED ORDER — FLUTICASONE PROPIONATE 50 MCG/ACT NA SUSP: 2.0000 | Freq: Every day | NASAL | 2 refills | Status: DC

## 2022-09-23 MED ORDER — FEXOFENADINE HCL 180 MG PO TABS: 180.0000 mg | ORAL_TABLET | Freq: Every day | ORAL | 2 refills | Status: DC

## 2022-09-23 NOTE — Patient Instructions (Addendum)
It was great to see you again today.  Call Dr. Hyacinth Meeker Gynecologist - you are due for a pap smear [facility] for [facility] at [facility] [address] [place],  [place]  [address] Main: [phone]  Ordered CT scan to look at hernia vs diastasis recti Refilled flonase and allegra  Don't take pepto bismol for a month, and then let's do the breath test.  Checking labs today  Follow up in a month, we'll recheck your blood pressure then too  Be well, Dr. Pollie Meyer

## 2022-09-23 NOTE — Progress Notes (Signed)
  Date of Visit: 09/23/2022   SUBJECTIVE:   HPI:  Karen Hopkins presents today for abdominal issues.  Hernia: Had hernia repair back in November to fix an umbilical hernia.  Sometime in the last couple of months she has developed increased issues with digestion, feelings of bloating, and feeling like her stomach is bulging out.  She has not followed up with her surgeon about this.  Has taken Pepto-Bismol, no other reflux medications.  No blood in her stool.  Feels somewhat constipated.  Previously we treated her with quadruple therapy for H. pylori, but she has not come in for a repeat urea breath test to confirm eradication.  Fatigue: Feels like her body is "off" but cannot really specify beyond that.  She would like to check full labs today.  Endorses feeling short of breath with exertion, which she attributes to gaining weight and being deconditioned.  Denies any orthopnea or paroxysmal nocturnal dyspnea.  Does note that she has gained some weight.  Does not exercise.   OBJECTIVE:   BP (!) 129/91   Pulse 96   Ht  (1.626 m)   Wt 249 lb 9.6 oz (113.2 kg)   SpO2 100%   BMI 42.84 kg/m  Gen: No acute distress, pleasant, cooperative HEENT: Normocephalic, atraumatic Heart: Regular rate and rhythm, no murmur Lungs: Clear to auscultation bilaterally, normal effort on room air, speaks in full sentences without distress.  Ambulated around clinic with continuous pulse ox and maintained oxygen saturation of 97% and above on room air. Neuro: Alert, grossly nonfocal, speech normal Abdomen: Soft, obese, midline longitudinal bulge with Valsalva superior to the umbilicus.  No tenderness or organomegaly appreciated.  No signs of incarceration of the bowel. Ext: Mild lower extremity edema bilaterally  ASSESSMENT/PLAN:   Health maintenance:  -Advised she is due for Pap smear, sees GYN, provided GYN phone number for her to schedule  Dyspnea Endorsing fatigue/mild dyspnea with ambulation.  Checking  CBC, CMET, TSH, BNP today.  No hypoxia with ambulation here in the family medicine center.  No chest pain.  If persist consider echo.  Essential hypertension BP elevated today, plan to recheck when I see her in a few weeks.  H. pylori infection Completed treatment.  Unable to test for eradication due to recent Pepto-Bismol use.  She will hold off on using this for several weeks and then return for repeat breath test.  Abdominal hernia Unclear if abdominal protrusion patient feels is ventral hernia or simply diastasis recti.  I suspect it is diastasis.  Ordered CT scan to further evaluate.  Advised she follow-up with her surgeon as well.  Seasonal allergies Refilled Allegra and Flonase at patient's request.  FOLLOW UP: Follow up in 1 month for above issues Encouraged to schedule with GYN and her surgeon  [place] J. Pollie Meyer, MD [facility]

## 2022-09-24 LAB — CMP14+EGFR
ALT: 12 IU/L (ref 0–32)
AST: 15 IU/L (ref 0–40)
Albumin/Globulin Ratio: 1.4 (ref 1.2–2.2)
Albumin: 4.5 g/dL (ref 3.9–4.9)
Alkaline Phosphatase: 84 IU/L (ref 44–121)
BUN/Creatinine Ratio: 11 (ref 9–23)
BUN: 7 mg/dL (ref 6–24)
Bilirubin Total: 0.4 mg/dL (ref 0.0–1.2)
CO2: 20 mmol/L (ref 20–29)
Calcium: 9.3 mg/dL (ref 8.7–10.2)
Chloride: 105 mmol/L (ref 96–106)
Creatinine, Ser: 0.63 mg/dL (ref 0.57–1.00)
Globulin, Total: 3.2 g/dL (ref 1.5–4.5)
Glucose: 81 mg/dL (ref 70–99)
Potassium: 4.5 mmol/L (ref 3.5–5.2)
Sodium: 140 mmol/L (ref 134–144)
Total Protein: 7.7 g/dL (ref 6.0–8.5)
eGFR: 112 mL/min/{1.73_m2} (ref >59)

## 2022-09-24 LAB — CBC WITH DIFFERENTIAL/PLATELET
Basophils Absolute: 0 10*3/uL (ref 0.0–0.2)
Basos: 0 %
EOS (ABSOLUTE): 0.4 10*3/uL (ref 0.0–0.4)
Eos: 4 %
Hematocrit: 42.9 % (ref 34.0–46.6)
Hemoglobin: 13.6 g/dL (ref 11.1–15.9)
Immature Grans (Abs): 0 10*3/uL (ref 0.0–0.1)
Immature Granulocytes: 0 %
Lymphocytes Absolute: 2.8 10*3/uL (ref 0.7–3.1)
Lymphs: 29 %
MCH: 28.7 pg (ref 26.6–33.0)
MCHC: 31.7 g/dL (ref 31.5–35.7)
MCV: 91 fL (ref 79–97)
Monocytes Absolute: 0.6 10*3/uL (ref 0.1–0.9)
Monocytes: 6 %
Neutrophils Absolute: 5.9 10*3/uL (ref 1.4–7.0)
Neutrophils: 61 %
Platelets: 354 10*3/uL (ref 150–450)
RBC: 4.74 x10E6/uL (ref 3.77–5.28)
RDW: 14 % (ref 11.7–15.4)
WBC: 9.8 10*3/uL (ref 3.4–10.8)

## 2022-09-24 LAB — TSH RFX ON ABNORMAL TO FREE T4: TSH: 0.683 u[IU]/mL (ref 0.450–4.500)

## 2022-09-25 DIAGNOSIS — J302 Other seasonal allergic rhinitis: Secondary | ICD-10-CM | POA: Insufficient documentation

## 2022-09-25 DIAGNOSIS — K469 Unspecified abdominal hernia without obstruction or gangrene: Secondary | ICD-10-CM | POA: Insufficient documentation

## 2022-09-25 DIAGNOSIS — A048 Other specified bacterial intestinal infections: Secondary | ICD-10-CM | POA: Insufficient documentation

## 2022-09-25 LAB — BRAIN NATRIURETIC PEPTIDE: BNP: 4.3 pg/mL (ref 0.0–100.0)

## 2022-09-25 NOTE — Assessment & Plan Note (Signed)
BP elevated today, plan to recheck when I see her in a few weeks.

## 2022-09-25 NOTE — Assessment & Plan Note (Signed)
Completed treatment.  Unable to test for eradication due to recent Pepto-Bismol use.  She will hold off on using this for several weeks and then return for repeat breath test.

## 2022-09-25 NOTE — Assessment & Plan Note (Signed)
Endorsing fatigue/mild dyspnea with ambulation.  Checking CBC, CMET, TSH, BNP today.  No hypoxia with ambulation here in the family medicine center.  No chest pain.  If persist consider echo.

## 2022-09-25 NOTE — Assessment & Plan Note (Addendum)
Unclear if abdominal protrusion patient feels is ventral hernia or simply diastasis recti.  I suspect it is diastasis.  Ordered CT scan to further evaluate.  Advised she follow-up with her surgeon as well.

## 2022-09-25 NOTE — Assessment & Plan Note (Signed)
Refilled Allegra and Flonase at patient's request.

## 2022-10-11 ENCOUNTER — Ambulatory Visit (HOSPITAL_COMMUNITY)
Admission: RE | Admit: 2022-10-11 | Discharge: 2022-10-11 | Disposition: A | Discharge status: Home / Self Care | Payer: 59 | Source: Ambulatory Visit | Attending: Family Medicine | Admitting: Family Medicine

## 2022-10-11 DIAGNOSIS — N200 Calculus of kidney: Secondary | ICD-10-CM | POA: Diagnosis not present

## 2022-10-11 DIAGNOSIS — K429 Umbilical hernia without obstruction or gangrene: Secondary | ICD-10-CM | POA: Diagnosis not present

## 2022-10-21 ENCOUNTER — Other Ambulatory Visit: Payer: Self-pay

## 2022-10-21 ENCOUNTER — Encounter: Payer: Self-pay | Admitting: Family Medicine

## 2022-10-21 ENCOUNTER — Ambulatory Visit (INDEPENDENT_AMBULATORY_CARE_PROVIDER_SITE_OTHER): Payer: 59 | Admitting: Family Medicine

## 2022-10-21 VITALS — BP 139/82 | HR 102 | Ht 65.0 in | Wt 255.0 lb

## 2022-10-21 DIAGNOSIS — N200 Calculus of kidney: Secondary | ICD-10-CM | POA: Diagnosis not present

## 2022-10-21 DIAGNOSIS — K469 Unspecified abdominal hernia without obstruction or gangrene: Secondary | ICD-10-CM | POA: Diagnosis not present

## 2022-10-21 DIAGNOSIS — J302 Other seasonal allergic rhinitis: Secondary | ICD-10-CM

## 2022-10-21 DIAGNOSIS — A048 Other specified bacterial intestinal infections: Secondary | ICD-10-CM

## 2022-10-21 MED ORDER — TETANUS-DIPHTH-ACELL PERTUSSIS 5-2.5-18.5 LF-MCG/0.5 IM SUSP: 0.5000 mL | Freq: Once | INTRAMUSCULAR | 0 refills | Status: AC

## 2022-10-21 NOTE — Assessment & Plan Note (Signed)
Incidentally noted on CT scan, currently asymptomatic though right-sided stone is enlarging compared to 4 years ago.  Discussed options for evaluation, patient would prefer referral to urology.  I have placed this referral.

## 2022-10-21 NOTE — Progress Notes (Signed)
    Date of Visit: 10/21/2022   SUBJECTIVE:   HPI:  Karen Hopkins presents today for follow-up.  Allergies: Currently taking Allegra 180 mg daily and Flonase 2 sprays to each nostril daily.  Symptoms are well-controlled with this regimen.  H. pylori: Previously we were unable to do her urea breath test as she had been taking Pepto-Bismol.  Has not taken any acid reflux medications in the last month.  Is agreeable to redoing breath test today.  Follow-up of CT scan: Underwent CT scan to evaluate for recurrent hernia versus rectus diastases.  CT demonstrated diastasis, also incidentally noted kidney stones bilaterally, right-sided 8 mm, previously 5 mm in 2020.  She would like to see urology about this.  Does not have any symptoms of kidney stones at this time.  Weight management: Has had difficulty losing the weight she has gained.  Is trying to be more active and walk.  She is interested in seeing a nutritionist.   OBJECTIVE:   BP 139/82   Pulse (!) 102   Ht 5\' 5"  (1.651 m)   Wt 255 lb (115.7 kg)   SpO2 100%   BMI 42.43 kg/m  Gen: No acute distress, pleasant, cooperative, well-appearing HEENT: Normocephalic, atraumatic Heart: Regular rate and rhythm, no murmur Lungs: Clear to auscultation bilaterally, normal effort Neuro: Alert, grossly nonfocal, speech normal  ASSESSMENT/PLAN:   Health maintenance:  -Agreeable to annual wellness visit, will get her added to the list -Encouraged her to call gynecology to schedule Pap smear as she follows with them for her Paps -Given prescription for Tdap to get at her pharmacy  Morbid obesity (HCC) Refer to nutritionist.  Provided Dr. Gerilyn Pilgrim phone number so she can call to schedule.  Seasonal allergies Doing well, continue current regimen  Abdominal hernia No recurrent hernia seen on CT, does have diastasis recti.  Discussed that surgical correction of this is usually considered cosmetic, but she can follow-up with her surgeon to discuss  further.  Nephrolithiasis Incidentally noted on CT scan, currently asymptomatic though right-sided stone is enlarging compared to 4 years ago.  Discussed options for evaluation, patient would prefer referral to urology.  I have placed this referral.  FOLLOW UP: Referring to urology and nutrition Patient instructed to schedule with GYN and her surgeon. Follow-up with me as needed  [place] J. Pollie Meyer, MD [facility]

## 2022-10-21 NOTE — Assessment & Plan Note (Addendum)
No recurrent hernia seen on CT, does have diastasis recti.  Discussed that surgical correction of this is usually considered cosmetic, but she can follow-up with her surgeon to discuss further.

## 2022-10-21 NOTE — Assessment & Plan Note (Signed)
Doing well, continue current regimen. 

## 2022-10-21 NOTE — Assessment & Plan Note (Signed)
Refer to nutritionist.  Provided Dr. Gerilyn Pilgrim phone number so she can call to schedule.

## 2022-10-21 NOTE — Patient Instructions (Addendum)
It was great to see you again today.  Call Dr. Hyacinth Meeker Gynecologist - you are due for a pap smear [facility] for [facility] at [facility] [address] [place],  [place]  [address] Main: [phone]  Retesting for H. pylori today.  Referring to urology for the stone seen in your kidneys  Someone will contact you about scheduling an annual wellness visit  Take the prescription for tetanus vaccine to your pharmacy.  For weight management: Call Dr. Gerilyn Pilgrim (our nutritionist) to set up an appointment. Her phone number is: [phone].   Be well, Dr. Pollie Meyer

## 2022-10-23 LAB — H. PYLORI BREATH TEST: H pylori Breath Test: POSITIVE — AB

## 2022-10-25 ENCOUNTER — Telehealth: Payer: Self-pay | Admitting: Family Medicine

## 2022-10-25 NOTE — Telephone Encounter (Signed)
Called patient to schedule Medicare Annual Wellness Visit (AWV). Left message for patient to call back and schedule Medicare Annual Wellness Visit (AWV).  Last date of AWV: Hasn't had one  If patient calls back please forward message to [facility] for schedule or secure chat me to get patient scheduled.   If any questions, please contact me at [phone].  Thank you ,  Brenda A Warrick

## 2022-10-27 ENCOUNTER — Encounter: Payer: Self-pay | Admitting: Family Medicine

## 2022-10-28 ENCOUNTER — Other Ambulatory Visit: Payer: Self-pay | Admitting: Family Medicine

## 2022-10-28 DIAGNOSIS — A048 Other specified bacterial intestinal infections: Secondary | ICD-10-CM

## 2022-10-28 NOTE — Progress Notes (Signed)
Referral to GI placed due to persistent H pylori after treatment

## 2022-11-09 ENCOUNTER — Encounter: Payer: Self-pay | Admitting: Urology

## 2022-11-09 ENCOUNTER — Ambulatory Visit (INDEPENDENT_AMBULATORY_CARE_PROVIDER_SITE_OTHER): Payer: 59 | Admitting: Urology

## 2022-11-09 VITALS — BP 130/88 | HR 109 | Ht 63.0 in | Wt 246.0 lb

## 2022-11-09 DIAGNOSIS — N200 Calculus of kidney: Secondary | ICD-10-CM | POA: Diagnosis not present

## 2022-11-09 LAB — URINALYSIS, ROUTINE W REFLEX MICROSCOPIC
Bilirubin, UA: NEGATIVE
Glucose, UA: NEGATIVE
Leukocytes,UA: NEGATIVE
Nitrite, UA: NEGATIVE
RBC, UA: NEGATIVE
Specific Gravity, UA: 1.02 (ref 1.005–1.030)
Urobilinogen, Ur: 2 mg/dL — ABNORMAL HIGH (ref 0.2–1.0)
pH, UA: 8 — ABNORMAL HIGH (ref 5.0–7.5)

## 2022-11-09 NOTE — Progress Notes (Signed)
Assessment: 1. Nephrolithiasis     Plan: I personally reviewed the patient's chart including provider notes, lab and imaging results. I personally viewed the CT studies from 10/13/22 and 11/15/18 with results as noted below. Options for management of a nonobstructing renal calculus discussed with the patient including observation, shockwave lithotripsy, ureteroscopic laser lithotripsy, and percutaneous nephrolithotomy. Given the stone location in the lower pole, stability in size over 5 years, and the lack of symptoms, I think it be reasonable to monitor this stone. Dietary modifications to reduce the risk of nephrolithiasis discussed and information provided. Return to office in 6 months with KUB.  Chief Complaint:  Chief Complaint  Patient presents with   Nephrolithiasis    History of Present Illness:  Karen Hopkins is a 47 y.o. female who is seen in consultation from Latrelle Dodrill, MD for evaluation of nephrolithiasis. She recently underwent evaluation for abdominal pain and possible hernia. CT imaging showed a punctate nonobstructing left renal calculus and an 8 mm inferior pole right renal calculus without obstruction. No prior history of kidney stones.  No flank pain.  No dysuria or gross hematuria.  No recent UTIs.  CT imaging from 6/20 showed a 5 mm calculus in the inferior pole of the right kidney.   Past Medical History:  Past Medical History:  Diagnosis Date   Anxiety    Arthritis    Cancer (HCC) 2011   Rt. Br. Ca   Depression    Diabetes mellitus without complication (HCC)    has been "borderline"   Essential hypertension 11/05/2015   History of radiation therapy    Hypertension    under control with med., has been on med. x 2 yr.   Lymphedema of arm    right; no BP or puncture to right arm   Personal history of chemotherapy 09/16/2009   rt breast   Pneumonia    Seasonal allergies    Sinus headache     Past Surgical History:  Past Surgical  History:  Procedure Laterality Date   BREAST BIOPSY Right 08/26/2009   BREAST REDUCTION SURGERY Left 08/23/2013   Procedure: LEFT BREAST REDUCTION  ;  Surgeon: Wayland Denis, DO;  Location: [facility];  Service: Plastics;  Laterality: Left;   BREAST REDUCTION WITH MASTOPEXY Left 08/22/2019   Procedure: Left breast mastopexy reduction with liposuction for breast symmetry;  Surgeon: Peggye Form, DO;  Location: [facility];  Service: Plastics;  Laterality: Left;   CESAREAN SECTION  07/15/2001; 03/18/2004; 11/21/2008   lateral orbiotomy Left 11/2015   Dr. Toni Arthurs at [facility]   LATISSIMUS FLAP TO BREAST Right 03/12/2013   Procedure: RIGHT BREAST LATISSIMUS FLAP WITH EXPANDER PLACEMENT;  Surgeon: Wayland Denis, DO;  Location: [facility];  Service: Plastics;  Laterality: Right;   LIPOSUCTION Bilateral 08/23/2013   Procedure: LIPOSUCTION;  Surgeon: Wayland Denis, DO;  Location: [facility];  Service: Plastics;  Laterality: Bilateral;   LIPOSUCTION WITH LIPOFILLING Left 08/22/2019   Procedure: LIPOSUCTION WITH LIPOFILLING;  Surgeon: Peggye Form, DO;  Location: [facility];  Service: Plastics;  Laterality: Left;   MASTECTOMY Right 2011   MODIFIED RADICAL MASTECTOMY Right 03/11/2010   NASAL SEPTUM SURGERY     ORIF ANKLE FRACTURE Left 07/18/2018   Procedure: OPEN REDUCTION INTERNAL FIXATION (ORIF) LEFT ANKLE FRACTURE WITH  SYNDESMOSIS AND ANKLE ARTHROTOMY;  Surgeon: Terance Hart, MD;  Location: [facility];  Service: Orthopedics;  Laterality: Left;   PORT-A-CATH  REMOVAL Left 12/01/2010   PORTACATH PLACEMENT Left 09/12/2009   REDUCTION MAMMAPLASTY Left 08/2013   REMOVAL OF TISSUE EXPANDER AND PLACEMENT OF IMPLANT Right 08/23/2013   Procedure: REMOVAL RIGHT TISSUE EXPANDER AND PLACEMENT OF IMPLANT TO RIGHT BREAST ;  Surgeon: Wayland Denis, DO;  Location: [facility];  Service: Plastics;  Laterality: Right;    SUPRA-UMBILICAL HERNIA  2006   TUBAL LIGATION  11/21/2008    Allergies:  Allergies  Allergen Reactions   Penicillins Swelling    FACIAL SWELLING Did it involve swelling of the face/tongue/throat, SOB, or low BP? Yes Did it involve sudden or severe rash/hives, skin peeling, or any reaction on the inside of your mouth or nose? Unknown Did you need to seek medical attention at a hospital or doctor's office? No When did it last happen? More than 10 years If all above answers are "NO", may proceed with cephalosporin use.    Lyrica [Pregabalin] Nausea Only    .   Meloxicam Nausea Only   Robaxin [Methocarbamol] Nausea Only    Family History:  Family History  Problem Relation Age of Onset   Hypertension Mother    Breast cancer Mother 55   Diabetes type II Father    Prostate cancer Father 76   Stroke Neg Hx    Colon polyps Neg Hx    Esophageal cancer Neg Hx    Pancreatic cancer Neg Hx    Stomach cancer Neg Hx    Rectal cancer Neg Hx     Social History:  Social History   Tobacco Use   Smoking status: Former    Packs/day: 0.00    Years: 0.00    Additional pack years: 0.00    Total pack years: 0.00    Types: Cigarettes    Start date: 06/07/2002    Quit date: 05/08/2019    Years since quitting: 3.5   Smokeless tobacco: Never  Vaping Use   Vaping Use: Never used  Substance Use Topics   Alcohol use: No   Drug use: No    Review of symptoms:  Constitutional:  Negative for unexplained weight loss, night sweats, fever, chills ENT:  Negative for nose bleeds, sinus pain, painful swallowing CV:  Negative for chest pain, shortness of breath, exercise intolerance, palpitations, loss of consciousness Resp:  Negative for cough, wheezing, shortness of breath GI:  Negative for nausea, vomiting, diarrhea, bloody stools GU:  Positives noted in HPI; otherwise negative for gross hematuria, dysuria, urinary incontinence Neuro:  Negative for seizures, poor balance, limb weakness, slurred  speech Psych:  Negative for lack of energy, depression, anxiety Endocrine:  Negative for polydipsia, polyuria, symptoms of hypoglycemia (dizziness, hunger, sweating) Hematologic:  Negative for anemia, purpura, petechia, prolonged or excessive bleeding, use of anticoagulants  Allergic:  Negative for difficulty breathing or choking as a result of exposure to anything; no shellfish allergy; no allergic response (rash/itch) to materials, foods  Physical exam: BP 130/88   Pulse (!) 109   Ht 5\' 3"  (1.6 m)   Wt 246 lb (111.6 kg)   BMI 43.58 kg/m  GENERAL APPEARANCE:  Well appearing, well developed, well nourished, NAD HEENT: Atraumatic, Normocephalic, oropharynx clear. NECK: Supple without lymphadenopathy or thyromegaly. LUNGS: Clear to auscultation bilaterally. HEART: Regular Rate and Rhythm without murmurs, gallops, or rubs. ABDOMEN: Soft, non-tender, No Masses. EXTREMITIES: Moves all extremities well.  Without clubbing, cyanosis, or edema. NEUROLOGIC:  Alert and oriented x 3, normal gait, CN II-XII grossly intact.  MENTAL STATUS:  Appropriate. BACK:  Non-tender to palpation.  No CVAT SKIN:  Warm, dry and intact.    Results: U/A:  negative

## 2022-11-10 ENCOUNTER — Encounter: Payer: Self-pay | Admitting: Family Medicine

## 2022-11-11 NOTE — Telephone Encounter (Signed)
Patient needs to follow up with urology office if she wants detailed explanation of UA result. Or she can schedule an appointment here to discuss more- it is not a simple mychart message to explain all this  Thanks Latrelle Dodrill, MD

## 2022-11-12 ENCOUNTER — Telehealth: Payer: Self-pay | Admitting: Gastroenterology

## 2022-11-12 ENCOUNTER — Encounter: Payer: Self-pay | Admitting: Nurse Practitioner

## 2022-11-12 NOTE — Telephone Encounter (Signed)
Patient called stating she is have severe stomach pains, she can not keep any food down, and also has diarrhea. She has been referred for H. pylori infection. She is scheduled for the next available appointment. Requesting a call back to discuss if anything could be done before then to help symptoms. Please advise, thank you.

## 2022-11-12 NOTE — Telephone Encounter (Signed)
Left message on machine to call back  

## 2022-11-15 NOTE — Telephone Encounter (Signed)
The pt was last seen in 2020 and states she has developed abd pain and diarrhea.  She has an appt on 8/29 with Willette Cluster NP.  She was advised to call her PCP in the meantime for evaluation.  The pt has been advised of the information and verbalized understanding.

## 2023-01-05 ENCOUNTER — Other Ambulatory Visit: Payer: Self-pay | Admitting: Family Medicine

## 2023-02-01 ENCOUNTER — Other Ambulatory Visit: Payer: Self-pay | Admitting: Family Medicine

## 2023-02-01 DIAGNOSIS — R0789 Other chest pain: Secondary | ICD-10-CM | POA: Diagnosis not present

## 2023-02-01 DIAGNOSIS — I972 Postmastectomy lymphedema syndrome: Secondary | ICD-10-CM | POA: Diagnosis not present

## 2023-02-01 DIAGNOSIS — M25572 Pain in left ankle and joints of left foot: Secondary | ICD-10-CM | POA: Diagnosis not present

## 2023-02-01 DIAGNOSIS — G8929 Other chronic pain: Secondary | ICD-10-CM | POA: Diagnosis not present

## 2023-02-01 DIAGNOSIS — M542 Cervicalgia: Secondary | ICD-10-CM | POA: Diagnosis not present

## 2023-02-01 DIAGNOSIS — M5416 Radiculopathy, lumbar region: Secondary | ICD-10-CM | POA: Diagnosis not present

## 2023-02-03 ENCOUNTER — Ambulatory Visit (INDEPENDENT_AMBULATORY_CARE_PROVIDER_SITE_OTHER): Payer: 59 | Admitting: Nurse Practitioner

## 2023-02-03 ENCOUNTER — Encounter: Payer: Self-pay | Admitting: Nurse Practitioner

## 2023-02-03 ENCOUNTER — Telehealth: Payer: Self-pay | Admitting: Nurse Practitioner

## 2023-02-03 VITALS — BP 124/70 | HR 101 | Ht 64.0 in | Wt 236.0 lb

## 2023-02-03 DIAGNOSIS — R1010 Upper abdominal pain, unspecified: Secondary | ICD-10-CM | POA: Diagnosis not present

## 2023-02-03 DIAGNOSIS — A048 Other specified bacterial intestinal infections: Secondary | ICD-10-CM

## 2023-02-03 DIAGNOSIS — R140 Abdominal distension (gaseous): Secondary | ICD-10-CM | POA: Diagnosis not present

## 2023-02-03 DIAGNOSIS — G8929 Other chronic pain: Secondary | ICD-10-CM

## 2023-02-03 DIAGNOSIS — K219 Gastro-esophageal reflux disease without esophagitis: Secondary | ICD-10-CM | POA: Diagnosis not present

## 2023-02-03 MED ORDER — BIS SUBCIT-METRONID-TETRACYC 140-125-125 MG PO CAPS: 3.0000 | ORAL_CAPSULE | Freq: Three times a day (TID) | ORAL | 0 refills | Status: DC

## 2023-02-03 MED ORDER — BISMUTH SUBSALICYLATE 262 MG PO CHEW: 524.0000 mg | CHEWABLE_TABLET | Freq: Four times a day (QID) | ORAL | 0 refills | Status: AC

## 2023-02-03 MED ORDER — DOXYCYCLINE HYCLATE 100 MG PO TABS: 100.0000 mg | ORAL_TABLET | Freq: Two times a day (BID) | ORAL | 0 refills | Status: AC

## 2023-02-03 MED ORDER — METRONIDAZOLE 250 MG PO TABS: 250.0000 mg | ORAL_TABLET | Freq: Four times a day (QID) | ORAL | 0 refills | Status: AC

## 2023-02-03 MED ORDER — OMEPRAZOLE 20 MG PO CPDR: DELAYED_RELEASE_CAPSULE | ORAL | 5 refills | Status: DC

## 2023-02-03 MED ORDER — OMEPRAZOLE 40 MG PO CPDR: 40.0000 mg | DELAYED_RELEASE_CAPSULE | Freq: Every day | ORAL | 1 refills | Status: DC

## 2023-02-03 NOTE — Patient Instructions (Addendum)
We have sent the following medications to your pharmacy for you to pick up at your convenience:  Bismuth 524 mg four times daily, Flagyl 250mg  1 tablet four times daily, Doxycycline 100mg  capsule twice daily for 10 days along with Omeprazole 40 mg twice daily for 10 days. (  will decrease omeprazole to once daily after completion of H. Pylori treatment)  After you have been off of antibiotics for 4 weeks complete stool test.   Your provider has requested that you go to the basement level for lab( 4 weeks after completion of antibiotics)  . Press "B" on the elevator. The lab is located at the first door on the left as you exit the elevator. ( STOOL KIT)   Ompeprazole 40 mg -Take 1 capsule by mouth 30 mins before breakfast. (START after completion of stool studies).  Follow up with Willette Cluster, NP on 04/27/23 at 9:30 am   _______________________________________________________  If your blood pressure at your visit was 140/90 or greater, please contact your primary care physician to follow up on this.  _______________________________________________________  If you are age 78 or older, your body mass index should be between 23-30. Your Body mass index is 40.51 kg/m. If this is out of the aforementioned range listed, please consider follow up with your Primary Care Provider.  If you are age 54 or younger, your body mass index should be between 19-25. Your Body mass index is 40.51 kg/m. If this is out of the aformentioned range listed, please consider follow up with your Primary Care Provider.   ________________________________________________________  The [facility] providers would like to encourage you to use [facility] to communicate with providers for non-urgent requests or questions.  Due to long hold times on the telephone, sending your provider a message by [facility] may be a faster and more efficient way to get a response.  Please allow 48 business hours for a response.  Please remember  that this is for non-urgent requests.  _______________________________________________________  Thank you for choosing me and [facility].

## 2023-02-03 NOTE — Progress Notes (Addendum)
ASSESSMENT & PLAN   47 y.o. yo female followed previously by Dr. Orvan Falconer ( in 2020).  She is referred by PCP for H. pylori infection  Chronic upper abdominal pain and bloating. She has a history of H.pylori gastroduodenitis diagnosed in 2020.   Incomplete H. pylori treatment in 2020 and doubtful after talking with her that she took quadruple tx prescribed by PCP in September 2023 .  H. pylori breath test still positive in May 2024 .  --Need to complete H. pylori tx to see if upper GI symptoms improve.  Treatment options are limited by her allergies, medication tolerances and insurance coverage . She is willing to retry metronidazole. Will treat with quadruple therapy x 14 days.  I stressed the importance of completing treatment  --4 weeks after completion of antibiotics,  and 2 weeks off PPI she will come for eradication testing with H. pylori stool study -- Follow-up with me in 6 to 8 weeks  GERD with frequent heartburn despite over-the-counter antacids -- While undergoing H.pylori treatment she will take Omeprazole 40 mg twice daily.  After completion of H pylori treatment she can decrease dose to once daily before breakfast.    History of colon polyps.  Small tubular adenoma removed in 2020 . Surveillance colonoscopy due Aug 2027  See PM[facility] for any additional medical history    HPI   Brief GI History   Brie established care with Dr. Orvan Falconer in May 2020 for evaluation of chronic bloating and epigastric pain, also weight loss.  She underwent an EGD Aug 2020 with findings of H. pylori gastritis. Metronidazole caused GI upset so she only took it a couple of days.   Was asked to follow up Nov 2020 to discuss other options ,  she didn't make the appt. We have not seen her since  PCP prescribed bismuth quadruple therapy in September 23 but patient does not think she ever took it and probably did not because another H. pylori breath test in May 2024 was positive   Chief complaint : H.  pylori positive, bloating, occasional upper abdominal discomfort  Shatia has heartburn on a daily basis.  Tums and Pepto aren't very helpful though she takes them on a regular basis.  She continues to have problems with abdominal bloating, occasional post-prandial, non-radiating upper abdominal discomfort. No NSAID use.  No blood in stool . Her weight is down from 250 in mid April to 236 pounds.  No acute findings on non contrast CT scan of the abdomen and pelvis in May.   Labs      Latest Ref Rng & Units 09/23/2022   10:50 AM 05/06/2021   11:35 AM 07/25/2019   11:38 AM  CBC  WBC 3.4 - 10.8 x10E3/uL 9.8  9.2  8.0   Hemoglobin 11.1 - 15.9 g/dL 69.6  29.5  28.4   Hematocrit 34.0 - 46.6 % 42.9  39.5  40.6   Platelets 150 - 450 x10E3/uL 354  254  255     Lab Results  Component Value Date   LIPASE 21 11/15/2018      Latest Ref Rng & Units 09/23/2022   10:50 AM 10/01/2021   10:51 AM 05/06/2021   11:35 AM  CMP  Glucose 70 - 99 mg/dL 81  88  98   BUN 6 - 24 mg/dL 7  8  8    Creatinine 0.57 - 1.00 mg/dL 1.32  4.40  1.02   Sodium 134 - 144 mmol/L 140  139  138   Potassium 3.5 - 5.2 mmol/L 4.5  4.4  3.5   Chloride 96 - 106 mmol/L 105  102  108   CO2 20 - 29 mmol/L 20  22  23    Calcium 8.7 - 10.2 mg/dL 9.3  9.5  8.9   Total Protein 6.0 - 8.5 g/dL 7.7   7.5   Total Bilirubin 0.0 - 1.2 mg/dL 0.4   0.4   Alkaline Phos 44 - 121 IU/L 84   63   AST 0 - 40 IU/L 15   15   ALT 0 - 32 IU/L 12   13      CT Abdomen Pelvis Wo Contrast CLINICAL DATA:  Abdominal wall hernia suspected.  EXAM: CT ABDOMEN AND PELVIS WITHOUT CONTRAST  TECHNIQUE: Multidetector CT imaging of the abdomen and pelvis was performed following the standard protocol without IV contrast.  RADIATION DOSE REDUCTION: This exam was performed according to the departmental dose-optimization program which includes automated exposure control, adjustment of the mA and/or kV according to patient size and/or use of iterative  reconstruction technique.  COMPARISON:  CT abdomen pelvis dated 06/16/2018.  FINDINGS: Evaluation of this exam is limited in the absence of intravenous contrast.  Lower chest: The visualized lung bases are clear.  No intra-abdominal free air or free fluid.  Hepatobiliary: Subcentimeter hypodense focus in the dome of the liver is too small to characterize. The liver is otherwise unremarkable. No biliary dilatation. The gallbladder is unremarkable.  Pancreas: Unremarkable. No pancreatic ductal dilatation or surrounding inflammatory changes.  Spleen: Normal in size without focal abnormality.  Adrenals/Urinary Tract: The adrenal glands are unremarkable. Punctate nonobstructing left renal inferior pole calculus. Additional 8 mm stone in the inferior pole of the right kidney. There is no hydronephrosis on either side. The visualized ureters and urinary bladder appear unremarkable.  Stomach/Bowel: There is no bowel obstruction or active inflammation. The appendix is normal.  Vascular/Lymphatic: The abdominal aorta and IVC are unremarkable on this noncontrast CT. No portal venous gas. There is no adenopathy.  Reproductive: The uterus is anteverted. An intrauterine device is noted. No adnexal masses. A single tubal ligation clip noted on the left.  Other: Status post prior repair of previously seen fat containing umbilical hernia with mesh. There is laxity of the anterior abdominal wall in the midline and at the level of the umbilicus with a broad base diastasis. There is stranding of the periumbilical subcutaneous fracture which may represent scarring. No fluid collection.  Musculoskeletal: No acute or significant osseous findings.  IMPRESSION: 1. No acute intra-abdominal or pelvic pathology. 2. Status post prior repair of previously seen fat containing umbilical hernia with mesh. There is laxity of the anterior abdominal wall in the midline and at the level of the  umbilicus with a broad base diastasis. No fluid collection. 3. Nonobstructing bilateral renal calculi. No hydronephrosis. 4. No bowel obstruction. Normal appendix.  Electronically Signed   By: Elgie Collard M.D.   On: 10/13/2022 20:44   Procedures:   Colonoscopy for weight loss August 2020 - Non-bleeding internal hemorrhoids. - One 2 mm polyp at the hepatic flexure, removed with a cold snare. Resected and retrieved. - Two 1 to 2 mm polyps in the transverse colon, removed with a cold biopsy forceps. Resected and retrieved. - The examination was otherwise normal on direct and retroflexion views.   EGD August 2020 for weight loss and chronic bloating -- Normal esophagus. Biopsied. - Gastritis. Biopsied. - Erythematous duodenopathy. Biopsied  Diagnosis 1.  Surgical [P], duodenum - CHRONIC DUODENITIS WITH SURFACE GASTRIC FOVEOLAR METAPLASIA, SUGGESTIVE OF PEPTIC DUODENITIS 2. Surgical [P], stomach, fundus, gastric antrum and gastric body - GASTRIC ANTRAL AND OXYNTIC MUCOSA WITH HELICOBACTER PYLORI-ASSOCIATED GASTRITIS (CONFIRMED WITH WARTHIN STARRY STAIN) 3. Surgical [P], gastric polyp - GASTRIC HYPERPLASTIC POLYP - NEGATIVE FOR INTESTINAL METAPLASIA, DYSPLASIA OR MALIGNANCY 4. Surgical [P], esophageal - ESOPHAGEAL SQUAMOUS AND CARDIAC MUCOSA WITH NONSPECIFIC CHRONIC CARDITIS - NEGATIVE FOR INTESTINAL METAPLASIA OR DYSPLASIA 5. Surgical [P], colon, transverse and hepatic flexure, polyp (3) - TUBULAR ADENOMA WITHOUT HIGH-GRADE DYSPLASIA OR MALIGNANCY - DIMINUTIVE HYPERPLASTIC POLYP - OTHER FRAGMENT OF POLYPOID COLONIC MUCOSA WITH NO SPECIFIC HISTOPATHOLOGIC CHANGES   Past Medical History:  Diagnosis Date   Anxiety    Arthritis    Cancer (HCC) 2011   Rt. Br. Ca   Depression    Diabetes mellitus without complication (HCC)    has been "borderline"   Essential hypertension 11/05/2015   H. pylori infection    History of radiation therapy    Hypertension    under control with  med., has been on med. x 2 yr.   Lymphedema of arm    right; no BP or puncture to right arm   Personal history of chemotherapy 09/16/2009   rt breast   Pneumonia    Seasonal allergies    Sinus headache    Past Surgical History:  Procedure Laterality Date   BREAST BIOPSY Right 08/26/2009   BREAST REDUCTION SURGERY Left 08/23/2013   Procedure: LEFT BREAST REDUCTION  ;  Surgeon: Wayland Denis, DO;  Location: [facility];  Service: Plastics;  Laterality: Left;   BREAST REDUCTION WITH MASTOPEXY Left 08/22/2019   Procedure: Left breast mastopexy reduction with liposuction for breast symmetry;  Surgeon: Peggye Form, DO;  Location: [facility];  Service: Plastics;  Laterality: Left;   CESAREAN SECTION  07/15/2001; 03/18/2004; 11/21/2008   lateral orbiotomy Left 11/2015   Dr. T[facility]urs at [facility]   LATISSIMUS FLAP TO BREAST Right 03/12/2013   Procedure: RIGHT BREAST LATISSIMUS FLAP WITH EXPANDER PLACEMENT;  Surgeon: Wayland Denis, DO;  Location: [facility];  Service: Plastics;  Laterality: Right;   LIPOSUCTION Bilateral 08/23/2013   Procedure: LIPOSUCTION;  Surgeon: Wayland Denis, DO;  Location: [facility];  Service: Plastics;  Laterality: Bilateral;   LIPOSUCTION WITH LIPOFILLING Left 08/22/2019   Procedure: LIPOSUCTION WITH LIPOFILLING;  Surgeon: Peggye Form, DO;  Location: [facility];  Service: Plastics;  Laterality: Left;   MASTECTOMY Right 2011   MODIFIED RADICAL MASTECTOMY Right 03/11/2010   NASAL SEPTUM SURGERY     ORIF ANKLE FRACTURE Left 07/18/2018   Procedure: OPEN REDUCTION INTERNAL FIXATION (ORIF) LEFT ANKLE FRACTURE WITH  SYNDESMOSIS AND ANKLE ARTHROTOMY;  Surgeon: Terance Hart, MD;  Location: [facility];  Service: Orthopedics;  Laterality: Left;   PORT-A-CATH REMOVAL Left 12/01/2010   PORTACATH PLACEMENT Left 09/12/2009   REDUCTION MAMMAPLASTY Left 08/2013   REMOVAL OF TISSUE EXPANDER AND  PLACEMENT OF IMPLANT Right 08/23/2013   Procedure: REMOVAL RIGHT TISSUE EXPANDER AND PLACEMENT OF IMPLANT TO RIGHT BREAST ;  Surgeon: Wayland Denis, DO;  Location: [facility];  Service: Plastics;  Laterality: Right;   SUPRA-UMBILICAL HERNIA  2006   TUBAL LIGATION  11/21/2008   UMBILICAL HERNIA REPAIR  04/12/2022   [facility] DR   Family History  Problem Relation Age of Onset   Hypertension Mother    Breast cancer Mother 85   Diabetes type II  Father    Prostate cancer Father 32   Stroke Neg Hx    Colon polyps Neg Hx    Esophageal cancer Neg Hx    Pancreatic cancer Neg Hx    Stomach cancer Neg Hx    Rectal cancer Neg Hx    Social History   Tobacco Use   Smoking status: Former    Current packs/day: 0.00    Types: Cigarettes    Start date: 06/07/2002    Quit date: 05/08/2019    Years since quitting: 3.7   Smokeless tobacco: Never  Vaping Use   Vaping status: Never Used  Substance Use Topics   Alcohol use: No   Drug use: No   Current Outpatient Medications  Medication Sig Dispense Refill   bismuth subsalicylate (PEPTO-BISMOL) 262 MG chewable tablet Chew 2 tablets (524 mg total) by mouth 4 (four) times daily for 10 days. 80 tablet 0   cyclobenzaprine (FLEXERIL) 10 MG tablet Take 10 mg by mouth 2 (two) times daily as needed.     doxycycline (VIBRA-TABS) 100 MG tablet Take 1 tablet (100 mg total) by mouth 2 (two) times daily for 10 days. 20 tablet 0   fexofenadine (ALLEGRA) 180 MG tablet Take 1 tablet (180 mg total) by mouth daily. 30 tablet 2   fluticasone (FLONASE) 50 MCG/ACT nasal spray SHAKE LIQUID AND USE 2 SPRAYS IN EACH NOSTRIL DAILY 16 g 3   gabapentin (NEURONTIN) 100 MG capsule Take 200 mg by mouth 2 (two) times daily.     levonorgestrel (MIRENA) 20 MCG/24HR IUD 1 each by Intrauterine route once.     metroNIDAZOLE (FLAGYL) 250 MG tablet Take 1 tablet (250 mg total) by mouth 4 (four) times daily for 10 days. 38 tablet 0   omeprazole (PRILOSEC) 40 MG capsule Take 1  capsule (40 mg total) by mouth daily. 60 capsule 1   HYDROcodone-acetaminophen (NORCO/VICODIN) 5-325 MG tablet Take 1 tablet by mouth every 8 (eight) hours as needed for severe pain. (Patient not taking: Reported on 02/03/2023)     No current facility-administered medications for this visit.   Allergies  Allergen Reactions   Penicillins Swelling    FACIAL SWELLING Did it involve swelling of the face/tongue/throat, SOB, or low BP? Yes Did it involve sudden or severe rash/hives, skin peeling, or any reaction on the inside of your mouth or nose? Unknown Did you need to seek medical attention at a hospital or doctor's office? No When did it last happen? More than 10 years If all above answers are "NO", may proceed with cephalosporin use.    Lyrica [Pregabalin] Nausea Only    .   Meloxicam Nausea Only   Robaxin [Methocarbamol] Nausea Only    Review of Systems: All other systems reviewed and negative except where noted in HPI.   Wt Readings from Last 3 Encounters:  02/03/23 236 lb (107 kg)  11/09/22 246 lb (111.6 kg)  10/21/22 255 lb (115.7 kg)    Physical Exam:  BP 124/70   Pulse (!) 101   Ht 5\' 4"  (1.626 m)   Wt 236 lb (107 kg)   BMI 40.51 kg/m  Constitutional:  Pleasant, generally well appearing female in no acute distress. Psychiatric:  Normal mood and affect. Behavior is normal. EENT: Pupils normal.  Conjunctivae are normal. No scleral icterus. Neck supple.  Cardiovascular: Normal rate, regular rhythm.  Pulmonary/chest: Effort normal and breath sounds normal. No wheezing, rales or rhonchi. Abdominal: Soft, nondistended, nontender. Bowel sounds active throughout. There are no  masses palpable. No hepatomegaly. Neurological: Alert and oriented to Reames place and time. Skin: Skin is warm and dry. No rashes noted.  Willette Cluster, NP  02/03/2023, 11:52 AM  Cc:  Referring Provider Latrelle Dodrill, MD

## 2023-02-03 NOTE — Telephone Encounter (Signed)
PT is calling because the Pylera medication is not covered by her insurance and needs other options. Please advise.

## 2023-02-03 NOTE — Telephone Encounter (Signed)
Called and spoke with patient.   Prescription has been change to Bismuth 524 mg four times daily, Flagyl 250mg  1 tablet four times daily, Doxycycline 100mg  capsule twice daily for 10 days along with Omeprazole 40 mg twice daily. ( will decrease omeprazole to once daily after completion of H. Pylori treatment ) per Gunnar Fusi.   Patient voiced understanding.

## 2023-02-09 NOTE — Telephone Encounter (Signed)
Called patient in response to patient's complaints of not being able to keep down the Flagyl medication for the H. Pylori infection. Patient states she is tolerating the other medications, and is not drinking alcohol. States she is not able to keep the Flagyl down. Informed the patient, will be in contact with a possible change in medication. Patient understood and agreed.

## 2023-02-09 NOTE — Telephone Encounter (Signed)
Inbound call from patient stating that she is unable to keep Flagl down. Patient is requesting a call back to discuss. Please advise.

## 2023-02-11 NOTE — Telephone Encounter (Signed)
Called patient to inform about the stopping of the H. Pylori regimen at this time due to the patient's inability to take or keep the medication down. Informed the patient as soon as Dr. Myrtie Neither returns, both he and Ms. Wilmon Pali, NP will discuss a new regimen. Patient understood and agreed.

## 2023-02-14 ENCOUNTER — Other Ambulatory Visit: Payer: Self-pay | Admitting: *Deleted

## 2023-02-14 DIAGNOSIS — A048 Other specified bacterial intestinal infections: Secondary | ICD-10-CM

## 2023-02-14 MED ORDER — TETRACYCLINE HCL 500 MG PO CAPS
500.0000 mg | ORAL_CAPSULE | Freq: Four times a day (QID) | ORAL | 0 refills | Status: AC
Start: 2023-02-14 — End: 2023-02-28

## 2023-02-14 MED ORDER — CLARITHROMYCIN 500 MG PO TABS
500.0000 mg | ORAL_TABLET | Freq: Two times a day (BID) | ORAL | 0 refills | Status: AC
Start: 2023-02-14 — End: 2023-02-28

## 2023-02-14 MED ORDER — BISMUTH SUBSALICYLATE 262 MG PO CHEW
524.0000 mg | CHEWABLE_TABLET | Freq: Four times a day (QID) | ORAL | 0 refills | Status: AC
Start: 2023-02-14 — End: 2023-02-28

## 2023-02-14 NOTE — Telephone Encounter (Signed)
Called the patient to inform of the change in medications due to patient's allergy to Flagyl. Another quad ABX medications was prescribed. After speaking with patient, was informed she would not be allowed to obtain Pepto-Bismol. Called and spoke to the pharmacist. He did confirm that Meloxicam is not in Pepto-Bismol, but hung up prior to receiving his name. Called back to obtain the name of the Pharmacist, "Donny." Called patient to notify not to take PPI's for 2 weeks prior to taking the Stool test. Patient understood and agreed. Medicaions called in to the [facility] on [address].

## 2023-02-16 ENCOUNTER — Ambulatory Visit (INDEPENDENT_AMBULATORY_CARE_PROVIDER_SITE_OTHER): Payer: 59

## 2023-02-16 VITALS — Ht 64.0 in | Wt 236.0 lb

## 2023-02-16 DIAGNOSIS — Z0000 Encounter for general adult medical examination without abnormal findings: Secondary | ICD-10-CM

## 2023-02-16 NOTE — Progress Notes (Signed)
Subjective:   Karen Hopkins is a 47 y.o. female who presents for an Initial Medicare Annual Wellness Visit.  Visit Complete: Virtual  I connected with  Karen Hopkins on 02/16/23 by a audio enabled telemedicine application and verified that I am speaking with the correct Karen Hopkins using two identifiers.  Patient Location: Home  Provider Location: Home Office  I discussed the limitations of evaluation and management by telemedicine. The patient expressed understanding and agreed to proceed.  Vital Signs: Because this visit was a virtual/telehealth visit, some criteria may be missing or patient reported. Any vitals not documented were not able to be obtained and vitals that have been documented are patient reported.   Review of Systems     Cardiac Risk Factors include: hypertension;sedentary lifestyle     Objective:    Today's Vitals   02/16/23 1511  Weight: 236 lb (107 kg)  Height: 5\' 4"  (1.626 m)   Body mass index is 40.51 kg/m.     02/16/2023    3:16 PM 10/21/2022    9:23 AM 09/23/2022    9:52 AM 03/16/2022    3:00 PM 02/18/2022   10:30 AM 01/18/2022   12:43 PM 08/22/2019    7:13 AM  Advanced Directives  Does Patient Have a Medical Advance Directive? No No No No No No No  Would patient like information on creating a medical advance directive? Yes (MAU/Ambulatory/Procedural Areas - Information given) No - Patient declined No - Patient declined No - Patient declined Yes (MAU/Ambulatory/Procedural Areas - Information given)  No - Patient declined    Current Medications (verified) Outpatient Encounter Medications as of 02/16/2023  Medication Sig   bismuth subsalicylate (PEPTO BISMOL) 262 MG chewable tablet Chew 2 tablets (524 mg total) by mouth in the morning, at noon, in the evening, and at bedtime for 14 days.   clarithromycin (BIAXIN) 500 MG tablet Take 1 tablet (500 mg total) by mouth 2 (two) times daily for 14 days.   cyclobenzaprine (FLEXERIL) 10 MG tablet Take 10 mg  by mouth 2 (two) times daily as needed.   fexofenadine (ALLEGRA) 180 MG tablet Take 1 tablet (180 mg total) by mouth daily.   fluticasone (FLONASE) 50 MCG/ACT nasal spray SHAKE LIQUID AND USE 2 SPRAYS IN EACH NOSTRIL DAILY   gabapentin (NEURONTIN) 100 MG capsule Take 200 mg by mouth 2 (two) times daily.   HYDROcodone-acetaminophen (NORCO/VICODIN) 5-325 MG tablet Take 1 tablet by mouth every 8 (eight) hours as needed for severe pain.   levonorgestrel (MIRENA) 20 MCG/24HR IUD 1 each by Intrauterine route once.   omeprazole (PRILOSEC) 40 MG capsule Take 1 capsule (40 mg total) by mouth daily.   tetracycline (SUMYCIN) 500 MG capsule Take 1 capsule (500 mg total) by mouth 4 (four) times daily for 14 days.   No facility-administered encounter medications on file as of 02/16/2023.    Allergies (verified) Penicillins, Lyrica [pregabalin], Meloxicam, and Robaxin [methocarbamol]   History: Past Medical History:  Diagnosis Date   Anxiety    Arthritis    Cancer (HCC) 2011   Rt. Br. Ca   Depression    Diabetes mellitus without complication (HCC)    has been "borderline"   Essential hypertension 11/05/2015   H. pylori infection    History of radiation therapy    Hypertension    under control with med., has been on med. x 2 yr.   Lymphedema of arm    right; no BP or puncture to right arm  Personal history of chemotherapy 09/16/2009   rt breast   Pneumonia    Seasonal allergies    Sinus headache    Past Surgical History:  Procedure Laterality Date   BREAST BIOPSY Right 08/26/2009   BREAST REDUCTION SURGERY Left 08/23/2013   Procedure: LEFT BREAST REDUCTION  ;  Surgeon: Wayland Denis, DO;  Location: [facility];  Service: Plastics;  Laterality: Left;   BREAST REDUCTION WITH MASTOPEXY Left 08/22/2019   Procedure: Left breast mastopexy reduction with liposuction for breast symmetry;  Surgeon: Peggye Form, DO;  Location: [facility];  Service: Plastics;   Laterality: Left;   CESAREAN SECTION  07/15/2001; 03/18/2004; 11/21/2008   lateral orbiotomy Left 11/2015   Dr. Toni Arthurs at [facility]   LATISSIMUS FLAP TO BREAST Right 03/12/2013   Procedure: RIGHT BREAST LATISSIMUS FLAP WITH EXPANDER PLACEMENT;  Surgeon: Wayland Denis, DO;  Location: [facility];  Service: Plastics;  Laterality: Right;   LIPOSUCTION Bilateral 08/23/2013   Procedure: LIPOSUCTION;  Surgeon: Wayland Denis, DO;  Location: [facility];  Service: Plastics;  Laterality: Bilateral;   LIPOSUCTION WITH LIPOFILLING Left 08/22/2019   Procedure: LIPOSUCTION WITH LIPOFILLING;  Surgeon: Peggye Form, DO;  Location: [facility];  Service: Plastics;  Laterality: Left;   MASTECTOMY Right 2011   MODIFIED RADICAL MASTECTOMY Right 03/11/2010   NASAL SEPTUM SURGERY     ORIF ANKLE FRACTURE Left 07/18/2018   Procedure: OPEN REDUCTION INTERNAL FIXATION (ORIF) LEFT ANKLE FRACTURE WITH  SYNDESMOSIS AND ANKLE ARTHROTOMY;  Surgeon: Terance Hart, MD;  Location:  SURGERY CENTER;  Service: Orthopedics;  Laterality: Left;   PORT-A-CATH REMOVAL Left 12/01/2010   PORTACATH PLACEMENT Left 09/12/2009   REDUCTION MAMMAPLASTY Left 08/2013   REMOVAL OF TISSUE EXPANDER AND PLACEMENT OF IMPLANT Right 08/23/2013   Procedure: REMOVAL RIGHT TISSUE EXPANDER AND PLACEMENT OF IMPLANT TO RIGHT BREAST ;  Surgeon: Wayland Denis, DO;  Location:  SURGERY CENTER;  Service: Plastics;  Laterality: Right;   SUPRA-UMBILICAL HERNIA  2006   TUBAL LIGATION  11/21/2008   UMBILICAL HERNIA REPAIR  04/12/2022   [facility] DR   Family History  Problem Relation Age of Onset   Hypertension Mother    Breast cancer Mother 67   Diabetes type II Father    Prostate cancer Father 7   Stroke Neg Hx    Colon polyps Neg Hx    Esophageal cancer Neg Hx    Pancreatic cancer Neg Hx    Stomach cancer Neg Hx    Rectal cancer Neg Hx    Social History   Socioeconomic History   Marital status: Single     Spouse name: Not on file   Number of children: 3   Years of education: 16   Highest education level: Not on file  Occupational History   Occupation: Disability   Tobacco Use   Smoking status: Former    Current packs/day: 0.00    Types: Cigarettes    Start date: 06/07/2002    Quit date: 05/08/2019    Years since quitting: 3.7   Smokeless tobacco: Never  Vaping Use   Vaping status: Never Used  Substance and Sexual Activity   Alcohol use: No   Drug use: No   Sexual activity: Yes    Partners: Male    Birth control/protection: Surgical, I.U.D.    Comment: Mirena IUD inserted 06/04/19  Other Topics Concern   Not on file  Social History Narrative   Lives with kids  Caffeine use: none    Social Determinants of Health   Financial Resource Strain: Low Risk  (02/16/2023)   Overall Financial Resource Strain (CARDIA)    Difficulty of Paying Living Expenses: Not hard at all  Food Insecurity: No Food Insecurity (02/16/2023)   Hunger Vital Sign    Worried About Running Out of Food in the Last Year: Never true    Ran Out of Food in the Last Year: Never true  Transportation Needs: No Transportation Needs (02/16/2023)   PRAPARE - Administrator, Civil Service (Medical): No    Lack of Transportation (Non-Medical): No  Physical Activity: Inactive (02/16/2023)   Exercise Vital Sign    Days of Exercise per Week: 0 days    Minutes of Exercise per Session: 0 min  Stress: No Stress Concern Present (02/16/2023)   [employer] of Occupational Health - Occupational Stress Questionnaire    Feeling of Stress : Not at all  Social Connections: Moderately Isolated (02/16/2023)   Social Connection and Isolation Panel [NHANES]    Frequency of Communication with Friends and Family: More than three times a week    Frequency of Social Gatherings with Friends and Family: Three times a week    Attends Religious Services: 1 to 4 times per year    Active Member of Clubs or Organizations: No     Attends Banker Meetings: Never    Marital Status: Never married    Tobacco Counseling Counseling given: Not Answered   Clinical Intake:  Pre-visit preparation completed: Yes  Pain : No/denies pain     Diabetes: No  How often do you need to have someone help you when you read instructions, pamphlets, or other written materials from your doctor or pharmacy?: 1 - Never  Interpreter Needed?: No  Information entered by :: Kandis Fantasia LPN   Activities of Daily Living    02/16/2023    3:16 PM  In your present state of health, do you have any difficulty performing the following activities:  Hearing? 0  Vision? 0  Difficulty concentrating or making decisions? 0  Walking or climbing stairs? 0  Dressing or bathing? 0  Doing errands, shopping? 0  Preparing Food and eating ? N  Using the Toilet? N  In the past six months, have you accidently leaked urine? N  Do you have problems with loss of bowel control? N  Managing your Medications? N  Managing your Finances? N  Housekeeping or managing your Housekeeping? N    Patient Care Team: Latrelle Dodrill, MD as PCP - General (Pediatrics) Anson Fret, MD as Consulting Physician (Neurology) Wynn Banker Victorino Sparrow, MD as Consulting Physician (Physical Medicine and Rehabilitation) Dillingham, Alena Bills, DO as Attending Physician (Plastic Surgery) Chilton Si, MD as Attending Physician (Cardiology) Magrinat, Valentino Hue, MD (Inactive) as Consulting Physician (Oncology) Hilarie Fredrickson, MD as Referring Physician (Family Medicine) Cyndia Bent, MD (Inactive) as Consulting Physician (General Surgery) Dillingham, Alena Bills, DO as Attending Physician (Plastic Surgery) Terance Hart, MD as Consulting Physician (Orthopedic Surgery) Jerene Bears, MD as Consulting Physician (Gynecology)  Indicate any recent Medical Services you may have received from other than [facility] providers in the past year (date may  be approximate).     Assessment:   This is a routine wellness examination for Shamikia.  Hearing/Vision screen Hearing Screening - Comments:: Denies hearing difficulties   Vision Screening - Comments:: No vision problems; will schedule routine eye exam soon  Goals Addressed             This Visit's Progress    Increase physical activity        Depression Screen    02/16/2023    3:14 PM 10/21/2022    9:54 AM 09/23/2022    9:52 AM 02/18/2022   10:37 AM 01/18/2022   12:43 PM 10/01/2021    9:34 AM 05/15/2019   10:58 AM  PHQ 2/9 Scores  PHQ - 2 Score 0 2 2 0 0 3 0  PHQ- 9 Score  5 7 0 0 6     Fall Risk    02/16/2023    3:15 PM 10/21/2022    9:23 AM 09/23/2022    9:52 AM 05/15/2019   10:57 AM 06/29/2018   10:00 AM  Fall Risk   Falls in the past year? 0 0 0 0 0  Number falls in past yr: 0 0 0 0   Injury with Fall? 0 0 0    Risk for fall due to : No Fall Risks      Follow up Falls prevention discussed;Education provided;Falls evaluation completed        MEDICARE RISK AT HOME: Medicare Risk at Home Any stairs in or around the home?: No If so, are there any without handrails?: No Home free of loose throw rugs in walkways, pet beds, electrical cords, etc?: Yes Adequate lighting in your home to reduce risk of falls?: Yes Life alert?: No Use of a cane, walker or w/c?: No Grab bars in the bathroom?: No Shower chair or bench in shower?: No Elevated toilet seat or a handicapped toilet?: No  TIMED UP AND GO:  Was the test performed? No    Cognitive Function:        02/16/2023    3:16 PM  6CIT Screen  What Year? 0 points  What month? 0 points  What time? 0 points  Count back from 20 0 points  Months in reverse 0 points  Repeat phrase 0 points  Total Score 0 points    Immunizations Immunization History  Administered Date(s) Administered   H1N1 05/20/2008   Influenza Split 05/12/2011   Influenza Whole 06/12/2007, 03/20/2008   Influenza,inj,Quad PF,6+ Mos  06/12/2013, 04/09/2014, 04/07/2015, 07/08/2016, 05/15/2019   Pneumococcal Polysaccharide-23 07/20/2016    TDAP status: Due, Education has been provided regarding the importance of this vaccine. Advised may receive this vaccine at local pharmacy or Health Dept. Aware to provide a copy of the vaccination record if obtained from local pharmacy or Health Dept. Verbalized acceptance and understanding.  Flu Vaccine status: Due, Education has been provided regarding the importance of this vaccine. Advised may receive this vaccine at local pharmacy or Health Dept. Aware to provide a copy of the vaccination record if obtained from local pharmacy or Health Dept. Verbalized acceptance and understanding.  Pneumococcal vaccine status: Up to date  Covid-19 vaccine status: Information provided on how to obtain vaccines.   Qualifies for Shingles Vaccine? Yes   Zostavax completed No   Shingrix Completed?: No.    Education has been provided regarding the importance of this vaccine. Patient has been advised to call insurance company to determine out of pocket expense if they have not yet received this vaccine. Advised may also receive vaccine at local pharmacy or Health Dept. Verbalized acceptance and understanding.  Screening Tests Health Maintenance  Topic Date Due   COVID-19 Vaccine (1) Never done   DTaP/Tdap/Td (1 - Tdap) Never done  PAP SMEAR-Modifier  06/27/2022   INFLUENZA VACCINE  01/06/2023   Medicare Annual Wellness (AWV)  02/16/2024   Colonoscopy  01/11/2026   Hepatitis C Screening  Completed   HIV Screening  Completed   HPV VACCINES  Aged Out    Health Maintenance  Health Maintenance Due  Topic Date Due   COVID-19 Vaccine (1) Never done   DTaP/Tdap/Td (1 - Tdap) Never done   PAP SMEAR-Modifier  06/27/2022   INFLUENZA VACCINE  01/06/2023    Colorectal cancer screening: Type of screening: Cologuard. Completed 01/12/19. Repeat every 7 years  Lung Cancer Screening: (Low Dose CT Chest  recommended if Age 35-80 years, 20 pack-year currently smoking OR have quit w/in 15years.) does not qualify.   Lung Cancer Screening Referral: n/a  Additional Screening:  Hepatitis C Screening: does qualify; Completed 04/28/18  Vision Screening: Recommended annual ophthalmology exams for early detection of glaucoma and other disorders of the eye. Is the patient up to date with their annual eye exam?  No  Who is the provider or what is the name of the office in which the patient attends annual eye exams? none If pt is not established with a provider, would they like to be referred to a provider to establish care? No .   Dental Screening: Recommended annual dental exams for proper oral hygiene  Community Resource Referral / Chronic Care Management: CRR required this visit?  No   CCM required this visit?  No     Plan:     I have personally reviewed and noted the following in the patient's chart:   Medical and social history Use of alcohol, tobacco or illicit drugs  Current medications and supplements including opioid prescriptions. Patient is currently taking opioid prescriptions. Information provided to patient regarding non-opioid alternatives. Patient advised to discuss non-opioid treatment plan with their provider. Functional ability and status Nutritional status Physical activity Advanced directives List of other physicians Hospitalizations, surgeries, and ER visits in previous 12 months Vitals Screenings to include cognitive, depression, and falls Referrals and appointments  In addition, I have reviewed and discussed with patient certain preventive protocols, quality metrics, and best practice recommendations. A written personalized care plan for preventive services as well as general preventive health recommendations were provided to patient.     Kandis Fantasia [place], [place]   [id number]/[id number]   After Visit Summary: (MyChart) Due to this being a telephonic visit, the  after visit summary with patients personalized plan was offered to patient via MyChart   Nurse Notes:    No concerns at this time

## 2023-02-16 NOTE — Patient Instructions (Signed)
Ms. Perezperez , Thank you for taking time to come for your Medicare Wellness Visit. I appreciate your ongoing commitment to your health goals. Please review the following plan we discussed and let me know if I can assist you in the future.   Referrals/Orders/Follow-Ups/Clinician Recommendations: Aim for 30 minutes of exercise or brisk walking, 6-8 glasses of water, and 5 servings of fruits and vegetables each day.  This is a list of the screening recommended for you and due dates:  Health Maintenance  Topic Date Due   COVID-19 Vaccine (1) Never done   DTaP/Tdap/Td vaccine (1 - Tdap) Never done   Pap Smear  06/27/2022   Flu Shot  01/06/2023   Medicare Annual Wellness Visit  02/16/2024   Colon Cancer Screening  01/11/2026   Hepatitis C Screening  Completed   HIV Screening  Completed   HPV Vaccine  Aged Out    Advanced directives: (ACP Link)Information on Advanced Care Planning can be found at [facility] Advance Health Care Directives Advance Health Care Directives ([email or web address]   Next Medicare Annual Wellness Visit scheduled for next year: Yes

## 2023-02-17 NOTE — Progress Notes (Signed)
____________________________________________________________  Attending physician addendum:  Thank you for sending this case to me. I have reviewed the entire note and agree with the plan.  Reviewing Dr. Blair Dolphin prior records, it is not clear to what extent this H. pylori is related to the reported symptoms.  Nevertheless, if she remains positive despite this current round of treatment, then please refer her to infectious disease.  Amada Jupiter, MD  ____________________________________________________________

## 2023-03-03 DIAGNOSIS — H04033 Chronic enlargement of bilateral lacrimal glands: Secondary | ICD-10-CM | POA: Diagnosis not present

## 2023-03-03 DIAGNOSIS — R519 Headache, unspecified: Secondary | ICD-10-CM | POA: Diagnosis not present

## 2023-03-03 DIAGNOSIS — H40023 Open angle with borderline findings, high risk, bilateral: Secondary | ICD-10-CM | POA: Diagnosis not present

## 2023-03-03 DIAGNOSIS — L723 Sebaceous cyst: Secondary | ICD-10-CM | POA: Diagnosis not present

## 2023-03-23 LAB — HM DIABETES EYE EXAM

## 2023-03-30 ENCOUNTER — Telehealth: Payer: Self-pay | Admitting: *Deleted

## 2023-03-30 NOTE — Telephone Encounter (Signed)
Patient came to the office to pick up stool kit ordered on 02/03/23 via Ms. Guenther,NP. Diatherix not noted on order form. Patient called to notify that she needed to go to the lab to pick up the stool kit and not the office. Also informed the patient to wait until next week to take the stool test. Patient wanted to know if she should wait to before restarting to take the PPI omeprazole medication. Informed the patient she should wait until after the stool test to restart taking the medication. Patient understood and agreed.

## 2023-04-06 ENCOUNTER — Other Ambulatory Visit: Payer: 59

## 2023-04-06 ENCOUNTER — Other Ambulatory Visit: Payer: Self-pay

## 2023-04-06 DIAGNOSIS — A048 Other specified bacterial intestinal infections: Secondary | ICD-10-CM

## 2023-04-06 DIAGNOSIS — K219 Gastro-esophageal reflux disease without esophagitis: Secondary | ICD-10-CM | POA: Diagnosis not present

## 2023-04-06 DIAGNOSIS — R140 Abdominal distension (gaseous): Secondary | ICD-10-CM

## 2023-04-07 LAB — HELICOBACTER PYLORI  SPECIAL ANTIGEN
MICRO NUMBER:: 15664296
SPECIMEN QUALITY: ADEQUATE

## 2023-04-13 ENCOUNTER — Encounter (HOSPITAL_BASED_OUTPATIENT_CLINIC_OR_DEPARTMENT_OTHER): Payer: Self-pay | Admitting: Certified Nurse Midwife

## 2023-04-13 ENCOUNTER — Ambulatory Visit (INDEPENDENT_AMBULATORY_CARE_PROVIDER_SITE_OTHER): Payer: 59 | Admitting: Certified Nurse Midwife

## 2023-04-13 ENCOUNTER — Other Ambulatory Visit (HOSPITAL_COMMUNITY)
Admission: RE | Admit: 2023-04-13 | Discharge: 2023-04-13 | Disposition: A | Discharge status: Home / Self Care | Payer: 59 | Source: Ambulatory Visit | Attending: Certified Nurse Midwife | Admitting: Certified Nurse Midwife

## 2023-04-13 VITALS — BP 141/81 | HR 92 | Ht 64.0 in | Wt 247.1 lb

## 2023-04-13 DIAGNOSIS — Z113 Encounter for screening for infections with a predominantly sexual mode of transmission: Secondary | ICD-10-CM

## 2023-04-13 DIAGNOSIS — Z1231 Encounter for screening mammogram for malignant neoplasm of breast: Secondary | ICD-10-CM

## 2023-04-13 DIAGNOSIS — Z6841 Body Mass Index (BMI) 40.0 and over, adult: Secondary | ICD-10-CM

## 2023-04-13 DIAGNOSIS — Z30431 Encounter for routine checking of intrauterine contraceptive device: Secondary | ICD-10-CM | POA: Diagnosis not present

## 2023-04-13 DIAGNOSIS — Z01419 Encounter for gynecological examination (general) (routine) without abnormal findings: Secondary | ICD-10-CM

## 2023-04-13 DIAGNOSIS — Z1151 Encounter for screening for human papillomavirus (HPV): Secondary | ICD-10-CM | POA: Insufficient documentation

## 2023-04-13 DIAGNOSIS — Z124 Encounter for screening for malignant neoplasm of cervix: Secondary | ICD-10-CM | POA: Insufficient documentation

## 2023-04-13 NOTE — Progress Notes (Signed)
47 y.o. [id number] Single Black or [place] American female here for annual exam.    No LMP recorded. (Menstrual status: IUD).          Sexually active: Yes.    The current method of family planning is IUD.     Upstream - 04/13/23 1548       Pregnancy Intention Screening   Does the patient want to become pregnant in the next year? No    Does the patient's partner want to become pregnant in the next year? No    Would the patient like to discuss contraceptive options today? No            The pregnancy intention screening data noted above was reviewed. Potential methods of contraception were discussed. The patient elected to proceed with No data recorded.  Exercising: Yes.      Health Maintenance: Pap:  06/2019, pap collected today History of abnormal Pap:  no MMG:  Pt states she is due for left sided screening mammogram. Hx Right Mastectomy, has right lymphedema.   reports that she quit smoking about 3 years ago. Her smoking use included cigarettes. She started smoking about 20 years ago. She has never used smokeless tobacco. She reports that she does not drink alcohol and does not use drugs.  Past Medical History:  Diagnosis Date   Anxiety    Arthritis    Cancer (HCC) 2011   Rt. Br. Ca   Depression    Diabetes mellitus without complication (HCC)    has been "borderline"   Essential hypertension 11/05/2015   H. pylori infection    History of radiation therapy    Hypertension    under control with med., has been on med. x 2 yr.   Lymphedema of arm    right; no BP or puncture to right arm   Personal history of chemotherapy 09/16/2009   rt breast   Pneumonia    Seasonal allergies    Sinus headache     Past Surgical History:  Procedure Laterality Date   BREAST BIOPSY Right 08/26/2009   BREAST REDUCTION SURGERY Left 08/23/2013   Procedure: LEFT BREAST REDUCTION  ;  Surgeon: Wayland Denis, DO;  Location: [facility];  Service: Plastics;  Laterality: Left;    BREAST REDUCTION WITH MASTOPEXY Left 08/22/2019   Procedure: Left breast mastopexy reduction with liposuction for breast symmetry;  Surgeon: Peggye Form, DO;  Location: [facility];  Service: Plastics;  Laterality: Left;   CESAREAN SECTION  07/15/2001; 03/18/2004; 11/21/2008   lateral orbiotomy Left 11/2015   Dr. Toni Arthurs at [facility]   LATISSIMUS FLAP TO BREAST Right 03/12/2013   Procedure: RIGHT BREAST LATISSIMUS FLAP WITH EXPANDER PLACEMENT;  Surgeon: Wayland Denis, DO;  Location: [facility];  Service: Plastics;  Laterality: Right;   LIPOSUCTION Bilateral 08/23/2013   Procedure: LIPOSUCTION;  Surgeon: Wayland Denis, DO;  Location: [facility];  Service: Plastics;  Laterality: Bilateral;   LIPOSUCTION WITH LIPOFILLING Left 08/22/2019   Procedure: LIPOSUCTION WITH LIPOFILLING;  Surgeon: Peggye Form, DO;  Location: [facility];  Service: Plastics;  Laterality: Left;   MASTECTOMY Right 2011   MODIFIED RADICAL MASTECTOMY Right 03/11/2010   NASAL SEPTUM SURGERY     ORIF ANKLE FRACTURE Left 07/18/2018   Procedure: OPEN REDUCTION INTERNAL FIXATION (ORIF) LEFT ANKLE FRACTURE WITH  SYNDESMOSIS AND ANKLE ARTHROTOMY;  Surgeon: Terance Hart, MD;  Location: [facility];  Service: Orthopedics;  Laterality: Left;  PORT-A-CATH REMOVAL Left 12/01/2010   PORTACATH PLACEMENT Left 09/12/2009   REDUCTION MAMMAPLASTY Left 08/2013   REMOVAL OF TISSUE EXPANDER AND PLACEMENT OF IMPLANT Right 08/23/2013   Procedure: REMOVAL RIGHT TISSUE EXPANDER AND PLACEMENT OF IMPLANT TO RIGHT BREAST ;  Surgeon: Wayland Denis, DO;  Location: [facility];  Service: Plastics;  Laterality: Right;   SUPRA-UMBILICAL HERNIA  2006   TUBAL LIGATION  11/21/2008   UMBILICAL HERNIA REPAIR  04/12/2022   CCS DR    Current Outpatient Medications  Medication Sig Dispense Refill   cyclobenzaprine (FLEXERIL) 10 MG tablet Take 10 mg by mouth 2 (two) times daily as  needed.     fexofenadine (ALLEGRA) 180 MG tablet Take 1 tablet (180 mg total) by mouth daily. 30 tablet 2   fluticasone (FLONASE) 50 MCG/ACT nasal spray SHAKE LIQUID AND USE 2 SPRAYS IN EACH NOSTRIL DAILY 16 g 3   gabapentin (NEURONTIN) 100 MG capsule Take 200 mg by mouth 2 (two) times daily.     HYDROcodone-acetaminophen (NORCO/VICODIN) 5-325 MG tablet Take 1 tablet by mouth every 8 (eight) hours as needed for severe pain.     levonorgestrel (MIRENA) 20 MCG/24HR IUD 1 each by Intrauterine route once.     omeprazole (PRILOSEC) 40 MG capsule Take 1 capsule (40 mg total) by mouth daily. 60 capsule 1   No current facility-administered medications for this visit.    Family History  Problem Relation Age of Onset   Hypertension Mother    Breast cancer Mother 94   Diabetes type II Father    Prostate cancer Father 44   Stroke Neg Hx    Colon polyps Neg Hx    Esophageal cancer Neg Hx    Pancreatic cancer Neg Hx    Stomach cancer Neg Hx    Rectal cancer Neg Hx     ROS: Constitutional: negative Genitourinary:negative  Exam:   BP (!) 141/81 (BP Location: Right Arm, Patient Position: Sitting, Cuff Size: Large)   Pulse 92   Ht 5\' 4"  (1.626 m)   Wt 247 lb 1.6 oz (112.1 kg)   BMI 42.41 kg/m   Height: 5\' 4"  (162.6 cm)  General appearance: alert, cooperative and appears stated age Head: Normocephalic, without obvious abnormality, atraumatic Neck: no adenopathy, supple, symmetrical, trachea midline  Breasts:  hx right mastectomy, mammogram ordered left breast Abdomen: soft, non-tender; bowel sounds normal; no masses,  no organomegaly Extremities: extremities normal, atraumatic, no cyanosis or edema Skin: Skin color, texture, turgor normal. No rashes or lesions Lymph nodes: Cervical, supraclavicular, and axillary nodes normal. No abnormal inguinal nodes palpated Neurologic: Grossly normal   Pelvic: External genitalia:  no lesions              Urethra:  normal appearing urethra with no  masses, tenderness or lesions              Bartholins and Skenes: normal                 Vagina: normal appearing vagina with normal color and no discharge, no lesions              Cervix: multiparous appearance and IUD strings visible 3cm              Pap taken: Yes.   Bimanual Exam:  Uterus:  normal size, contour, position, consistency, mobility, non-tender              Adnexa: no mass, fullness, tenderness  Anus:  normal sphincter tone, no lesions  Chaperone, Hendricks Milo, CMA, was present for exam.  Assessment/Plan:  1. Cervical cancer screening - Cytology - PAP( [place])  2. Routine screening for STI (sexually transmitted infection) - Cytology - PAP( [place]) - RPR - HIV antibody (with reflex) - Hepatitis B Surface AntiGEN - Hepatitis C Antibody  3. Encounter for screening mammogram for malignant neoplasm of breast - MM 3D SCREENING MAMMOGRAM UNILATERAL LEFT BREAST; Future  4. Encounter for gynecological examination without abnormal finding   5. Contraceptive, surveillance, intrauterine device - IUD check completed  RTO one year for annual gyn exam and prn if issues arise. Letta Kocher

## 2023-04-14 LAB — HEPATITIS B SURFACE ANTIGEN: Hepatitis B Surface Ag: NEGATIVE

## 2023-04-14 LAB — RPR: RPR Ser Ql: NONREACTIVE

## 2023-04-14 LAB — HIV ANTIBODY (ROUTINE TESTING W REFLEX): HIV Screen 4th Generation wRfx: NONREACTIVE

## 2023-04-14 LAB — HEPATITIS C ANTIBODY: Hep C Virus Ab: NONREACTIVE

## 2023-04-18 LAB — CYTOLOGY - PAP
Adequacy: ABSENT
Chlamydia: NEGATIVE
Comment: NEGATIVE
Comment: NEGATIVE
Comment: NEGATIVE
Comment: NEGATIVE
Comment: NORMAL
Diagnosis: NEGATIVE
HSV1: NEGATIVE
HSV2: NEGATIVE
High risk HPV: NEGATIVE
Neisseria Gonorrhea: NEGATIVE
Trichomonas: NEGATIVE

## 2023-04-26 NOTE — Progress Notes (Unsigned)
ASSESSMENT    Brief Narrative:  47 y.o.  female known to Dr. Myrtie Neither  with a past medical history not limited to R breast cancer s/p chemoradiation, right mastectomy, HTN, H. pylori infection, gastroduodenitis , chronic GERD   History of H.pylori gastritis in 2020.  Only partial treatment of H.pylori until just recently. Now completed treatment and subsequent H.pylori breath test is negative.   Chronic bloating and upper abdominal discomfort.  Symptoms have nearly resolved after H.pylori eradication  GERD with pyrosis.   Asymptomatic on since H.pylori treatment. Still taking Omeprazole.   History of colon polyps.  Small tubular adenoma removed in Aug 2027   PLAN   --Discussed anti-reflux measures such as avoidance of late meals / bedtime snacks, HOB elevation (or use of wedge pillow), weight reduction ( if applicable)  / maintaining a healthy BMI ( body mass index),  and avoidance of trigger foods and caffeine.  --Will see if she is able to wean off Omeprazole: Decrease Omeprazole dose to 40 mg every other day for 5 doses then stop. For recurrent reflux symptoms try Famotidine 20 mg daily as needed (over the counter)  If this is ineffective then may resume daily Omeprazole 40 mg  take 30 -60 minutes prior to breakfast  --If has to resume daily Omeprazole then needs to follow up with [place] in 6 months. --Surveillance colonocopy due Aug 2027  HPI   Chief complaint : Follow up on bloating, abdominal discomfort, H.pylori  Karen Hopkins was last seen 02/03/2023 for evaluation of H. pylori infection, chronic upper abdominal pain and bloating. Please refer to that note for details. In summary, she had seen [place] in 2020 for above symptoms.   EGD with biopsies positive for H. pylori gastritis.  She was unable to tolerate antibiotic regimen but lost to follow up.  In September 2023 PCP attempted to treat her with quadruple therapy sounds like patient did not take the course of antibiotics.  She had  another positive H. pylori breath test in May 2024 ordered by PCP .  When I saw her in clinic in late August she was having frequent heartburn, ongoing abdominal bloating and ongoing occasional postprandial upper abdominal pain.   Due to metronidazole intolerance and penicillin allergy she was treated with a course of clarithromycin, tetracycline, bismuth and omeprazole.   Follow-up H. pylori breath test 04/06/2023 was negative   She feels better. No abdominal discomfort and much less bloated She is pleased with improvement  Bowel movements are normal.    GI History / Pertinent GI Studies   **All endoscopic studies may not be included here    Colonoscopy Aug 2020 - The perianal and digital rectal examinations were normal. - Non-bleeding internal hemorrhoids were found. The hemorrhoids were small. - A 2 mm polyp was found in the hepatic flexure. The polyp was sessile. The polyp was removed with a cold snare. Resection and retrieval were complete. Estimated blood loss was minimal. - Two sessile polyps were found in the transverse colon. The polyps were 1 to 2 mm in size. These polyps were removed with a cold biopsy forceps. Resection and retrieval were complete. Estimated blood loss was minimal.   01/12/19 EGD  --- Normal esophagus. Biopsied. - Gastritis. Biopsied. - Erythematous duodenopathy. Biopsied. A few gastric polyps.   Surgical [P], duodenum - CHRONIC DUODENITIS WITH SURFACE GASTRIC FOVEOLAR METAPLASIA, SUGGESTIVE OF PEPTIC DUODENITIS 2. Surgical [P], stomach, fundus, gastric antrum and gastric body - GASTRIC ANTRAL AND OXYNTIC MUCOSA WITH  HELICOBACTER PYLORI-ASSOCIATED GASTRITIS (CONFIRMED WITH WARTHIN STARRY STAIN) 3. Surgical [P], gastric polyp - GASTRIC HYPERPLASTIC POLYP - NEGATIVE FOR INTESTINAL METAPLASIA, DYSPLASIA OR MALIGNANCY 4. Surgical [P], esophageal - ESOPHAGEAL SQUAMOUS AND CARDIAC MUCOSA WITH NONSPECIFIC CHRONIC CARDITIS - NEGATIVE FOR INTESTINAL METAPLASIA OR  DYSPLASIA 5. Surgical [P], colon, transverse and hepatic flexure, polyp (3) - TUBULAR ADENOMA WITHOUT HIGH-GRADE DYSPLASIA OR MALIGNANCY - DIMINUTIVE HYPERPLASTIC POLYP - OTHER FRAGMENT OF POLYPOID COLONIC MUCOSA WITH NO SPECIFIC HISTOPATHOLOGIC CHANGES     Latest Ref Rng & Units 09/23/2022   10:50 AM 05/06/2021   11:35 AM 07/25/2019   11:38 AM  Hepatic Function  Total Protein 6.0 - 8.5 g/dL 7.7  7.5  7.3   Albumin 3.9 - 4.9 g/dL 4.5  4.1  3.8   AST 0 - 40 IU/L 15  15  10    ALT 0 - 32 IU/L 12  13  12    Alk Phosphatase 44 - 121 IU/L 84  63  68   Total Bilirubin 0.0 - 1.2 mg/dL 0.4  0.4  0.5        Latest Ref Rng & Units 09/23/2022   10:50 AM 05/06/2021   11:35 AM 07/25/2019   11:38 AM  CBC  WBC 3.4 - 10.8 x10E3/uL 9.8  9.2  8.0   Hemoglobin 11.1 - 15.9 g/dL 81.1  91.4  78.2   Hematocrit 34.0 - 46.6 % 42.9  39.5  40.6   Platelets 150 - 450 x10E3/uL 354  254  255      Past Medical History:  Diagnosis Date   Anxiety    Arthritis    Cancer (HCC) 2011   Rt. Br. Ca   Depression    Diabetes mellitus without complication (HCC)    has been "borderline"   Essential hypertension 11/05/2015   H. pylori infection    History of radiation therapy    Hypertension    under control with med., has been on med. x 2 yr.   Lymphedema of arm    right; no BP or puncture to right arm   Personal history of chemotherapy 09/16/2009   rt breast   Pneumonia    Seasonal allergies    Sinus headache     Past Surgical History:  Procedure Laterality Date   BREAST BIOPSY Right 08/26/2009   BREAST REDUCTION SURGERY Left 08/23/2013   Procedure: LEFT BREAST REDUCTION  ;  Surgeon: Wayland Denis, DO;  Location: [facility];  Service: Plastics;  Laterality: Left;   BREAST REDUCTION WITH MASTOPEXY Left 08/22/2019   Procedure: Left breast mastopexy reduction with liposuction for breast symmetry;  Surgeon: Peggye Form, DO;  Location: [facility];  Service: Plastics;   Laterality: Left;   CESAREAN SECTION  07/15/2001; 03/18/2004; 11/21/2008   lateral orbiotomy Left 11/2015   Dr. Toni Arthurs at [facility]   LATISSIMUS FLAP TO BREAST Right 03/12/2013   Procedure: RIGHT BREAST LATISSIMUS FLAP WITH EXPANDER PLACEMENT;  Surgeon: Wayland Denis, DO;  Location: [facility];  Service: Plastics;  Laterality: Right;   LIPOSUCTION Bilateral 08/23/2013   Procedure: LIPOSUCTION;  Surgeon: Wayland Denis, DO;  Location: [facility];  Service: Plastics;  Laterality: Bilateral;   LIPOSUCTION WITH LIPOFILLING Left 08/22/2019   Procedure: LIPOSUCTION WITH LIPOFILLING;  Surgeon: Peggye Form, DO;  [facility];  Service: Plastics;  Laterality: Left;   MASTECTOMY Right 2011   MODIFIED RADICAL MASTECTOMY Right 03/11/2010   NASAL SEPTUM SURGERY     ORIF ANKLE  FRACTURE Left 07/18/2018   Procedure: OPEN REDUCTION INTERNAL FIXATION (ORIF) LEFT ANKLE FRACTURE WITH  SYNDESMOSIS AND ANKLE ARTHROTOMY;  Surgeon: Terance Hart, MD;  Location: [facility];  Service: Orthopedics;  Laterality: Left;   PORT-A-CATH REMOVAL Left 12/01/2010   PORTACATH PLACEMENT Left 09/12/2009   REDUCTION MAMMAPLASTY Left 08/2013   REMOVAL OF TISSUE EXPANDER AND PLACEMENT OF IMPLANT Right 08/23/2013   Procedure: REMOVAL RIGHT TISSUE EXPANDER AND PLACEMENT OF IMPLANT TO RIGHT BREAST ;  Surgeon: Wayland Denis, DO;  Location: [facility];  Service: Plastics;  Laterality: Right;   SUPRA-UMBILICAL HERNIA  2006   TUBAL LIGATION  11/21/2008   UMBILICAL HERNIA REPAIR  04/12/2022   CCS DR    Family History  Problem Relation Age of Onset   Hypertension Mother    Breast cancer Mother 9   Diabetes type II Father    Prostate cancer Father 57   Stroke Neg Hx    Colon polyps Neg Hx    Esophageal cancer Neg Hx    Pancreatic cancer Neg Hx    Stomach cancer Neg Hx    Rectal cancer Neg Hx     Current Medications, Allergies, Family History and Social  History were reviewed in [employer] medical record.     Current Outpatient Medications  Medication Sig Dispense Refill   cyclobenzaprine (FLEXERIL) 10 MG tablet Take 10 mg by mouth 2 (two) times daily as needed.     fexofenadine (ALLEGRA) 180 MG tablet Take 1 tablet (180 mg total) by mouth daily. 30 tablet 2   fluticasone (FLONASE) 50 MCG/ACT nasal spray SHAKE LIQUID AND USE 2 SPRAYS IN EACH NOSTRIL DAILY 16 g 3   gabapentin (NEURONTIN) 100 MG capsule Take 200 mg by mouth 2 (two) times daily.     HYDROcodone-acetaminophen (NORCO/VICODIN) 5-325 MG tablet Take 1 tablet by mouth every 8 (eight) hours as needed for severe pain.     levonorgestrel (MIRENA) 20 MCG/24HR IUD 1 each by Intrauterine route once.     omeprazole (PRILOSEC) 40 MG capsule Take 1 capsule (40 mg total) by mouth daily. 60 capsule 1   No current facility-administered medications for this visit.    Review of Systems: No chest pain. No shortness of breath. No urinary complaints.    Physical Exam  There were no vitals filed for this visit. Wt Readings from Last 3 Encounters:  04/13/23 247 lb 1.6 oz (112.1 kg)  02/16/23 236 lb (107 kg)  02/03/23 236 lb (107 kg)    BP (!) 128/90   Pulse 96   Ht 5\' 4"  (1.626 m)   Wt 253 lb 2 oz (114.8 kg)   BMI 43.45 kg/m  Constitutional:  Pleasant, generally well appearing female in no acute distress. Psychiatric: Normal mood and affect. Behavior is normal. EENT: Pupils normal.  Conjunctivae are normal. No scleral icterus. Neck supple.  Cardiovascular: Normal rate, regular rhythm.  Pulmonary/chest: Effort normal and breath sounds normal. No wheezing, rales or rhonchi. Abdominal: Soft, nondistended, nontender. Bowel sounds active throughout. There are no masses palpable. No hepatomegaly. Neurological: Alert and oriented to Karen Hopkins place and time.    Willette Cluster, NP  04/26/2023, 8:57 PM

## 2023-04-27 ENCOUNTER — Ambulatory Visit (INDEPENDENT_AMBULATORY_CARE_PROVIDER_SITE_OTHER): Payer: 59 | Admitting: Nurse Practitioner

## 2023-04-27 ENCOUNTER — Encounter: Payer: Self-pay | Admitting: Nurse Practitioner

## 2023-04-27 VITALS — BP 128/90 | HR 96 | Ht 64.0 in | Wt 253.1 lb

## 2023-04-27 DIAGNOSIS — Z860101 Personal history of adenomatous and serrated colon polyps: Secondary | ICD-10-CM

## 2023-04-27 DIAGNOSIS — R140 Abdominal distension (gaseous): Secondary | ICD-10-CM

## 2023-04-27 DIAGNOSIS — K219 Gastro-esophageal reflux disease without esophagitis: Secondary | ICD-10-CM | POA: Diagnosis not present

## 2023-04-27 DIAGNOSIS — A048 Other specified bacterial intestinal infections: Secondary | ICD-10-CM

## 2023-04-27 NOTE — Patient Instructions (Addendum)
Wean off Omeprazole: Decrease Omeprazole dose to 40 mg every other day for 5 doses then stop. Continue anti-reflux measures as below. If you get recurrent reflux symptoms would try Famotidine 20 mg daily as needed (over the counter)  If this is ineffective then may resume daily Omeprazole 40 mg  take 30 -60 minutes prior to breakfast and then follow up with [place] in 6 months   Acid Reflux Below are some measures you can take to possibly improve acid reflux symptoms . We may have discussed some of these today in the office. Not everything on this list may apply to you   --If you are taking anti-reflux ( GERD) medication be sure to take it 30 minutes before breakfast and if taking twice daily then also second dose should be 30 minutes before dinner.   --Avoid late meals / bedtime snacks.   --Avoid trigger foods ( foods which you know tend to aggravate you reflux symptoms). Some common trigger foods include spicy foods, fatty foods, acidic foods, chocolate and caffeine.  --Elevate the head of bed 6-8 inches on blocks or bricks. If not able to elevate the head of the bed consider purchasing a wedge pillow to sleep on.    --Weight reduction / maintain a healthy BMI ( body mass index) may be help with reflux symptoms  --Sometimes with the above mentioned "lifestyle changes" patients are able to reduce the amount of GERD medications they take. Our goal is to have you on the lowest effective dose of medication

## 2023-04-29 NOTE — Progress Notes (Signed)
____________________________________________________________  Attending physician addendum:  Thank you for sending this case to me. I have reviewed the entire note and agree with the plan.   Amada Jupiter, MD  ____________________________________________________________

## 2023-05-11 ENCOUNTER — Telehealth (HOSPITAL_BASED_OUTPATIENT_CLINIC_OR_DEPARTMENT_OTHER): Payer: Self-pay

## 2023-05-11 ENCOUNTER — Ambulatory Visit: Payer: 59 | Admitting: Urology

## 2023-05-11 ENCOUNTER — Other Ambulatory Visit: Payer: Self-pay

## 2023-05-11 DIAGNOSIS — N200 Calculus of kidney: Secondary | ICD-10-CM

## 2023-05-11 NOTE — Progress Notes (Unsigned)
Assessment: 1. Nephrolithiasis      Plan: I personally reviewed the patient's chart including provider notes, lab and imaging results. I personally viewed the CT studies from 10/13/22 and 11/15/18 with results as noted below. Options for management of a nonobstructing renal calculus discussed with the patient including observation, shockwave lithotripsy, ureteroscopic laser lithotripsy, and percutaneous nephrolithotomy. Given the stone location in the lower pole, stability in size over 5 years, and the lack of symptoms, I think it be reasonable to monitor this stone. Dietary modifications to reduce the risk of nephrolithiasis discussed and information provided. Return to office in 6 months with KUB.  Chief Complaint:  No chief complaint on file.   History of Present Illness:  Karen Hopkins is a 47 y.o. female who is seen in consultation from Latrelle Dodrill, MD for evaluation of nephrolithiasis. She recently underwent evaluation for abdominal pain and possible hernia. CT imaging showed a punctate nonobstructing left renal calculus and an 8 mm inferior pole right renal calculus without obstruction. No prior history of kidney stones.  No flank pain.  No dysuria or gross hematuria.  No recent UTIs.  CT imaging from 6/20 showed a 5 mm calculus in the inferior pole of the right kidney.   Past Medical History:  Past Medical History:  Diagnosis Date   Anxiety    Arthritis    Cancer (HCC) 2011   Rt. Br. Ca   Depression    Diabetes mellitus without complication (HCC)    has been "borderline"   Essential hypertension 11/05/2015   H. pylori infection    History of radiation therapy    Hypertension    under control with med., has been on med. x 2 yr.   Lymphedema of arm    right; no BP or puncture to right arm   Personal history of chemotherapy 09/16/2009   rt breast   Pneumonia    Seasonal allergies    Sinus headache     Past Surgical History:  Past Surgical History:   Procedure Laterality Date   BREAST BIOPSY Right 08/26/2009   BREAST REDUCTION SURGERY Left 08/23/2013   Procedure: LEFT BREAST REDUCTION  ;  Surgeon: Wayland Denis, DO;  Location: [facility];  Service: Plastics;  Laterality: Left;   BREAST REDUCTION WITH MASTOPEXY Left 08/22/2019   Procedure: Left breast mastopexy reduction with liposuction for breast symmetry;  Surgeon: Peggye Form, DO;  Location: [facility];  Service: Plastics;  Laterality: Left;   CESAREAN SECTION  07/15/2001; 03/18/2004; 11/21/2008   lateral orbiotomy Left 11/2015   Dr. Toni Arthurs at [facility]   LATISSIMUS FLAP TO BREAST Right 03/12/2013   Procedure: RIGHT BREAST LATISSIMUS FLAP WITH EXPANDER PLACEMENT;  Surgeon: Wayland Denis, DO;  Location: [facility];  Service: Plastics;  Laterality: Right;   LIPOSUCTION Bilateral 08/23/2013   Procedure: LIPOSUCTION;  Surgeon: Wayland Denis, DO;  Location: [facility];  Service: Plastics;  Laterality: Bilateral;   LIPOSUCTION WITH LIPOFILLING Left 08/22/2019   Procedure: LIPOSUCTION WITH LIPOFILLING;  Surgeon: Peggye Form, DO;  Location: [facility];  Service: Plastics;  Laterality: Left;   MASTECTOMY Right 2011   MODIFIED RADICAL MASTECTOMY Right 03/11/2010   NASAL SEPTUM SURGERY     ORIF ANKLE FRACTURE Left 07/18/2018   Procedure: OPEN REDUCTION INTERNAL FIXATION (ORIF) LEFT ANKLE FRACTURE WITH  SYNDESMOSIS AND ANKLE ARTHROTOMY;  Surgeon: Terance Hart, MD;  Location:  SUR[facility]ERY C[facility];  Service: Orthopedics;  Laterality: Left;  PORT-A-CATH REMOVAL Left 12/01/2010   PORTACATH PLACEMENT Left 09/12/2009   REDUCTION MAMMAPLASTY Left 08/2013   REMOVAL OF TISSUE EXPANDER AND PLACEMENT OF IMPLANT Right 08/23/2013   Procedure: REMOVAL RIGHT TISSUE EXPANDER AND PLACEMENT OF IMPLANT TO RIGHT BREAST ;  Surgeon: Wayland Denis, DO;  Location: [facility];  Service: Plastics;  Laterality: Right;    SUPRA-UMBILICAL HERNIA  2006   TUBAL LIGATION  11/21/2008   UMBILICAL HERNIA REPAIR  04/12/2022   CCS DR    Allergies:  Allergies  Allergen Reactions   Penicillins Swelling    FACIAL SWELLING Did it involve swelling of the face/tongue/throat, SOB, or low BP? Yes Did it involve sudden or severe rash/hives, skin peeling, or any reaction on the inside of your mouth or nose? Unknown Did you need to seek medical attention at a hospital or doctor's office? No When did it last happen? More than 10 years If all above answers are "NO", may proceed with cephalosporin use.    Lyrica [Pregabalin] Nausea Only    .   Meloxicam Nausea Only   Robaxin [Methocarbamol] Nausea Only    Family History:  Family History  Problem Relation Age of Onset   Hypertension Mother    Breast cancer Mother 5   Diabetes type II Father    Prostate cancer Father 6   Stroke Neg Hx    Colon polyps Neg Hx    Esophageal cancer Neg Hx    Pancreatic cancer Neg Hx    Stomach cancer Neg Hx    Rectal cancer Neg Hx     Social History:  Social History   Tobacco Use   Smoking status: Former    Current packs/day: 0.00    Types: Cigarettes    Start date: 06/07/2002    Quit date: 05/08/2019    Years since quitting: 4.0   Smokeless tobacco: Never  Vaping Use   Vaping status: Never Used  Substance Use Topics   Alcohol use: No   Drug use: No    ROS: Constitutional:  Negative for fever, chills, weight loss CV: Negative for chest pain, previous MI, hypertension Respiratory:  Negative for shortness of breath, wheezing, sleep apnea, frequent cough GI:  Negative for nausea, vomiting, bloody stool, GERD   Physical exam: There were no vitals taken for this visit. GENERAL APPEARANCE:  Well appearing, well developed, well nourished, NAD HEENT:  Atraumatic, normocephalic, oropharynx clear NECK:  Supple without lymphadenopathy or thyromegaly ABDOMEN:  Soft, non-tender, no masses EXTREMITIES:  Moves all extremities  well, without clubbing, cyanosis, or edema NEUROLOGIC:  Alert and oriented x 3, normal gait, CN II-XII grossly intact MENTAL STATUS:  appropriate BACK:  Non-tender to palpation, No CVAT SKIN:  Warm, dry, and intact  Results: U/A:

## 2023-06-23 ENCOUNTER — Other Ambulatory Visit (HOSPITAL_BASED_OUTPATIENT_CLINIC_OR_DEPARTMENT_OTHER): Payer: Self-pay | Admitting: Certified Nurse Midwife

## 2023-06-28 DIAGNOSIS — G8929 Other chronic pain: Secondary | ICD-10-CM | POA: Diagnosis not present

## 2023-06-28 DIAGNOSIS — M542 Cervicalgia: Secondary | ICD-10-CM | POA: Diagnosis not present

## 2023-06-28 DIAGNOSIS — M25572 Pain in left ankle and joints of left foot: Secondary | ICD-10-CM | POA: Diagnosis not present

## 2023-06-28 DIAGNOSIS — K5903 Drug induced constipation: Secondary | ICD-10-CM | POA: Diagnosis not present

## 2023-06-28 DIAGNOSIS — I972 Postmastectomy lymphedema syndrome: Secondary | ICD-10-CM | POA: Diagnosis not present

## 2023-06-28 DIAGNOSIS — R0789 Other chest pain: Secondary | ICD-10-CM | POA: Diagnosis not present

## 2023-08-23 ENCOUNTER — Telehealth: Payer: Self-pay | Admitting: Urology

## 2023-08-23 NOTE — Telephone Encounter (Signed)
 Pt called about rescheduling this appointment that she no showed she was suppose to Return in about 6 months (around 05/11/2023) for follow-up. She was last seen on 11/09/2022 and she stated she wanted to see if the stones she had were in the same place or not. She stated they were not giving her any trouble. Plus she wanted to know if she still needed to have the xray done. Please advise.

## 2023-08-30 ENCOUNTER — Ambulatory Visit (HOSPITAL_BASED_OUTPATIENT_CLINIC_OR_DEPARTMENT_OTHER)
Admission: RE | Admit: 2023-08-30 | Discharge: 2023-08-30 | Disposition: A | Discharge status: Home / Self Care | Source: Ambulatory Visit | Attending: Urology | Admitting: Urology

## 2023-08-30 ENCOUNTER — Ambulatory Visit (INDEPENDENT_AMBULATORY_CARE_PROVIDER_SITE_OTHER): Admitting: Urology

## 2023-08-30 ENCOUNTER — Encounter: Payer: Self-pay | Admitting: Urology

## 2023-08-30 VITALS — BP 131/87 | HR 97 | Ht 64.0 in | Wt 258.0 lb

## 2023-08-30 DIAGNOSIS — N200 Calculus of kidney: Secondary | ICD-10-CM | POA: Diagnosis not present

## 2023-08-30 LAB — URINALYSIS, ROUTINE W REFLEX MICROSCOPIC
Bilirubin, UA: NEGATIVE
Glucose, UA: NEGATIVE
Ketones, UA: NEGATIVE
Leukocytes,UA: NEGATIVE
Nitrite, UA: NEGATIVE
Protein,UA: NEGATIVE
RBC, UA: NEGATIVE
Specific Gravity, UA: 1.02 (ref 1.005–1.030)
Urobilinogen, Ur: 0.2 mg/dL (ref 0.2–1.0)
pH, UA: 6.5 (ref 5.0–7.5)

## 2023-08-30 NOTE — Progress Notes (Signed)
 Assessment: 1. Nephrolithiasis     Plan: I reviewed the KUB study from today.  A 7 mm calcification is seen in the lower right renal shadow. Given the stone location in the lower pole, stability in size over 5 years, and the lack of symptoms, I think it is reasonable to continue to monitor this stone. Dietary modifications to reduce the risk of nephrolithiasis discussed and information provided. Return to office in 1 year with KUB.  Chief Complaint:  Chief Complaint  Patient presents with   Nephrolithiasis    History of Present Illness:  Karen Hopkins is a 48 y.o. female who is seen for further evaluation of nephrolithiasis. At her initial visit in June 2024, she had been evaluated for abdominal pain and possible hernia. CT imaging showed a punctate nonobstructing left renal calculus and an 8 mm inferior pole right renal calculus without obstruction. No prior history of kidney stones.  No flank pain.  No dysuria or gross hematuria.  No recent UTIs.  CT imaging from 6/20 showed a 5 mm calculus in the inferior pole of the right kidney.  She returns today for follow-up.  She has not had any flank pain or lower urinary tract symptoms.  No dysuria or gross hematuria.  Portions of the above documentation were copied from a prior visit for review purposes only.    Past Medical History:  Past Medical History:  Diagnosis Date   Anxiety    Arthritis    Cancer (HCC) 2011   Rt. Br. Ca   Depression    Diabetes mellitus without complication (HCC)    has been "borderline"   Essential hypertension 11/05/2015   H. pylori infection    History of radiation therapy    Hypertension    under control with med., has been on med. x 2 yr.   Lymphedema of arm    right; no BP or puncture to right arm   Personal history of chemotherapy 09/16/2009   rt breast   Pneumonia    Seasonal allergies    Sinus headache     Past Surgical History:  Past Surgical History:  Procedure Laterality  Date   BREAST BIOPSY Right 08/26/2009   BREAST REDUCTION SURGERY Left 08/23/2013   Procedure: LEFT BREAST REDUCTION  ;  Surgeon: Wayland Denis, DO;  Location: [facility];  Service: Plastics;  Laterality: Left;   BREAST REDUCTION WITH MASTOPEXY Left 08/22/2019   Procedure: Left breast mastopexy reduction with liposuction for breast symmetry;  Surgeon: Peggye Form, DO;  Location: [facility];  Service: Plastics;  Laterality: Left;   CESAREAN SECTION  07/15/2001; 03/18/2004; 11/21/2008   lateral orbiotomy Left 11/2015   Dr. Toni Ar[facility]urs at [facility]   LATISSIMUS FLAP TO BREAST Right 03/12/2013   Procedure: RIGHT BREAST LATISSIMUS FLAP WITH EXPANDER PLACEMENT;  Surgeon: Wayland Denis, DO;  Location: [facility];  Service: Plastics;  Laterality: Right;   LIPOSUCTION Bilateral 08/23/2013   Procedure: LIPOSUCTION;  Surgeon: Wayland Denis, DO;  Location: [facility];  Service: Plastics;  Laterality: Bilateral;   LIPOSUCTION WITH LIPOFILLING Left 08/22/2019   Procedure: LIPOSUCTION WITH LIPOFILLING;  Surgeon: Peggye Form, DO;  Location: [facility];  Service: Plastics;  Laterality: Left;   MASTECTOMY Right 2011   MODIFIED RADICAL MASTECTOMY Right 03/11/2010   NASAL SEPTUM SURGERY     ORIF ANKLE FRACTURE Left 07/18/2018   Procedure: OPEN REDUCTION INTERNAL FIXATION (ORIF) LEFT ANKLE FRACTURE WITH  SYNDESMOSIS AND ANKLE ARTHROTOMY;  Surgeon: Terance Hart, MD;  Location: [facility];  Service: Orthopedics;  Laterality: Left;   PORT-A-CATH REMOVAL Left 12/01/2010   PORTACATH PLACEMENT Left 09/12/2009   REDUCTION MAMMAPLASTY Left 08/2013   REMOVAL OF TISSUE EXPANDER AND PLACEMENT OF IMPLANT Right 08/23/2013   Procedure: REMOVAL RIGHT TISSUE EXPANDER AND PLACEMENT OF IMPLANT TO RIGHT BREAST ;  Surgeon: Wayland Denis, DO;  Location: [facility];  Service: Plastics;  Laterality: Right;   SUPRA-UMBILICAL HERNIA  2006    TUBAL LIGATION  11/21/2008   UMBILICAL HERNIA REPAIR  04/12/2022   CCS DR    Allergies:  Allergies  Allergen Reactions   Penicillins Swelling    FACIAL SWELLING Did it involve swelling of the face/tongue/throat, SOB, or low BP? Yes Did it involve sudden or severe rash/hives, skin peeling, or any reaction on the inside of your mouth or nose? Unknown Did you need to seek medical attention at a hospital or doctor's office? No When did it last happen? More than 10 years If all above answers are "NO", may proceed with cephalosporin use.    Lyrica [Pregabalin] Nausea Only    .   Meloxicam Nausea Only   Robaxin [Methocarbamol] Nausea Only    Family History:  Family History  Problem Relation Age of Onset   Hypertension Mother    Breast cancer Mother 31   Diabetes type II Father    Prostate cancer Father 65   Stroke Neg Hx    Colon polyps Neg Hx    Esophageal cancer Neg Hx    Pancreatic cancer Neg Hx    Stomach cancer Neg Hx    Rectal cancer Neg Hx     Social History:  Social History   Tobacco Use   Smoking status: Former    Current packs/day: 0.00    Types: Cigarettes    Start date: 06/07/2002    Quit date: 05/08/2019    Years since quitting: 4.3   Smokeless tobacco: Never  Vaping Use   Vaping status: Never Used  Substance Use Topics   Alcohol use: No   Drug use: No    ROS: Constitutional:  Negative for fever, chills, weight loss CV: Negative for chest pain, previous MI, hypertension Respiratory:  Negative for shortness of breath, wheezing, sleep apnea, frequent cough GI:  Negative for nausea, vomiting, bloody stool, GERD  Physical exam: BP 131/87   Pulse 97   Ht 5\' 4"  (1.626 m)   Wt 258 lb (117 kg)   BMI 44.29 kg/m  GENERAL APPEARANCE:  Well appearing, well developed, well nourished, NAD HEENT:  Atraumatic, normocephalic, oropharynx clear NECK:  Supple without lymphadenopathy or thyromegaly ABDOMEN:  Soft, non-tender, no masses EXTREMITIES:  Moves all  extremities well, without clubbing, cyanosis, or edema NEUROLOGIC:  Alert and oriented x 3, normal gait, CN II-XII grossly intact MENTAL STATUS:  appropriate BACK:  Non-tender to palpation, No CVAT SKIN:  Warm, dry, and intact  Results: U/A: Negative

## 2023-08-31 ENCOUNTER — Ambulatory Visit (HOSPITAL_BASED_OUTPATIENT_CLINIC_OR_DEPARTMENT_OTHER)
Admission: RE | Admit: 2023-08-31 | Discharge: 2023-08-31 | Disposition: A | Discharge status: Home / Self Care | Source: Ambulatory Visit | Attending: Certified Nurse Midwife | Admitting: Certified Nurse Midwife

## 2023-08-31 ENCOUNTER — Encounter (HOSPITAL_BASED_OUTPATIENT_CLINIC_OR_DEPARTMENT_OTHER): Payer: Self-pay | Admitting: Radiology

## 2023-08-31 DIAGNOSIS — Z1231 Encounter for screening mammogram for malignant neoplasm of breast: Secondary | ICD-10-CM | POA: Insufficient documentation

## 2023-09-02 ENCOUNTER — Other Ambulatory Visit: Payer: Self-pay | Admitting: Certified Nurse Midwife

## 2023-09-02 DIAGNOSIS — R928 Other abnormal and inconclusive findings on diagnostic imaging of breast: Secondary | ICD-10-CM

## 2023-09-06 ENCOUNTER — Other Ambulatory Visit (HOSPITAL_BASED_OUTPATIENT_CLINIC_OR_DEPARTMENT_OTHER): Payer: Self-pay | Admitting: Certified Nurse Midwife

## 2023-09-06 DIAGNOSIS — R928 Other abnormal and inconclusive findings on diagnostic imaging of breast: Secondary | ICD-10-CM

## 2023-09-14 DIAGNOSIS — H40023 Open angle with borderline findings, high risk, bilateral: Secondary | ICD-10-CM | POA: Diagnosis not present

## 2023-09-14 LAB — HM DIABETES EYE EXAM

## 2023-09-16 ENCOUNTER — Ambulatory Visit
Admission: RE | Admit: 2023-09-16 | Discharge: 2023-09-16 | Disposition: A | Discharge status: Home / Self Care | Source: Ambulatory Visit | Attending: Certified Nurse Midwife | Admitting: Certified Nurse Midwife

## 2023-09-16 DIAGNOSIS — R928 Other abnormal and inconclusive findings on diagnostic imaging of breast: Secondary | ICD-10-CM

## 2023-09-19 ENCOUNTER — Other Ambulatory Visit: Payer: Self-pay | Admitting: Certified Nurse Midwife

## 2023-09-19 DIAGNOSIS — N6489 Other specified disorders of breast: Secondary | ICD-10-CM

## 2023-09-21 ENCOUNTER — Other Ambulatory Visit: Payer: Self-pay | Admitting: Nurse Practitioner

## 2023-10-24 ENCOUNTER — Ambulatory Visit: Payer: Self-pay | Admitting: Family Medicine

## 2023-10-24 ENCOUNTER — Ambulatory Visit (INDEPENDENT_AMBULATORY_CARE_PROVIDER_SITE_OTHER): Admitting: Family Medicine

## 2023-10-24 ENCOUNTER — Encounter: Payer: Self-pay | Admitting: Family Medicine

## 2023-10-24 DIAGNOSIS — Z0000 Encounter for general adult medical examination without abnormal findings: Secondary | ICD-10-CM

## 2023-10-24 DIAGNOSIS — C50811 Malignant neoplasm of overlapping sites of right female breast: Secondary | ICD-10-CM | POA: Diagnosis not present

## 2023-10-24 DIAGNOSIS — I890 Lymphedema, not elsewhere classified: Secondary | ICD-10-CM | POA: Diagnosis not present

## 2023-10-24 DIAGNOSIS — Z170 Estrogen receptor positive status [ER+]: Secondary | ICD-10-CM

## 2023-10-24 DIAGNOSIS — J302 Other seasonal allergic rhinitis: Secondary | ICD-10-CM

## 2023-10-24 LAB — POCT GLYCOSYLATED HEMOGLOBIN (HGB A1C): Hemoglobin A1C: 6 % — AB (ref 4.0–5.6)

## 2023-10-24 MED ORDER — FEXOFENADINE HCL 180 MG PO TABS
180.0000 mg | ORAL_TABLET | Freq: Every day | ORAL | 2 refills | Status: AC
Start: 2023-10-24 — End: ?

## 2023-10-24 MED ORDER — ZEPBOUND 2.5 MG/0.5ML ~~LOC~~ SOAJ: 2.5000 mg | SUBCUTANEOUS | 0 refills | Status: DC

## 2023-10-24 NOTE — Progress Notes (Signed)
  Date of Visit: 10/24/2023   SUBJECTIVE:   HPI:  Karen Hopkins presents today for routine follow up.  Lymphedema - needs new referral for her RUE lymphedema, previously was getting physical therapy compression treatments  Weight: Has had difficulty losing weight.  Since January stopped drinking sodas and eating sweets.  Has significantly cut back on how much fast food she is eating.  That is still somewhat of a struggle.  She is walking more often.  Walks 3 times a week for at least 15 minutes.  Ability to walk longer is limited due to deconditioning.  Does not eat any fried foods.  Mostly does baked or sauted chicken.  Allergies: Needs refill of Allegra , this works for her  History of breast cancer: Needs new referral to her oncologist, was told she could not schedule as it had been a while since she has been seen there.   OBJECTIVE:   BP (!) 126/95   Pulse 85   Ht 5\' 4"  (1.626 m)   Wt 266 lb (120.7 kg)   SpO2 100%   BMI 45.66 kg/m  Gen: No acute distress, pleasant, cooperative, well-appearing HEENT: Normocephalic, atraumatic Heart: Regular rate and rhythm, no murmur Lungs: Clear bilaterally, normal effort Neuro: Grossly nonfocal, speech normal Ext: Right upper extremity with significant lymphedema  ASSESSMENT/PLAN:   Assessment & Plan Morbid obesity (HCC) Making good lifestyle changes with persistent efforts. Would benefit from adjunct of medical therapy to help with weight loss Start Zepbound  2.5 mg weekly.  Follow-up 4 weeks after getting the medication. Checking labs today: CMET, lipids, A1c, TSH Continue lifestyle changes. Lymphedema of right arm Referral placed to lymphedema rehab clinic Malignant neoplasm of overlapping sites of right breast in female, estrogen receptor positive (HCC) Referral placed back to oncology for routine follow-up and surveillance Seasonal allergies Refill Allegra  Routine adult health maintenance Advised to get Tdap at her pharmacy     FOLLOW UP: Follow up in 4 weeks after starting zepbound  for weight management  [place] J. Dawn Eth, MD [facility]

## 2023-10-24 NOTE — Patient Instructions (Signed)
 It was great to see you again today.  Checking labs today  Referring to lymphdema clinic and to Dr. Arno Bibles  Get Tdap vaccine at your pharmacy  Refilled allegra   Sent in zepbound  for weight loss  Follow up with me in 1 month   Be well, Dr. Dawn Eth

## 2023-10-25 ENCOUNTER — Other Ambulatory Visit: Payer: Self-pay

## 2023-10-25 ENCOUNTER — Encounter: Payer: Self-pay | Admitting: Physical Therapy

## 2023-10-25 ENCOUNTER — Ambulatory Visit: Attending: Family Medicine | Admitting: Physical Therapy

## 2023-10-25 DIAGNOSIS — Z853 Personal history of malignant neoplasm of breast: Secondary | ICD-10-CM | POA: Diagnosis not present

## 2023-10-25 DIAGNOSIS — M25611 Stiffness of right shoulder, not elsewhere classified: Secondary | ICD-10-CM | POA: Diagnosis not present

## 2023-10-25 DIAGNOSIS — I972 Postmastectomy lymphedema syndrome: Secondary | ICD-10-CM | POA: Insufficient documentation

## 2023-10-25 LAB — CMP14+EGFR
ALT: 14 IU/L (ref 0–32)
AST: 16 IU/L (ref 0–40)
Albumin: 4 g/dL (ref 3.9–4.9)
Alkaline Phosphatase: 74 IU/L (ref 44–121)
BUN/Creatinine Ratio: 11 (ref 9–23)
BUN: 7 mg/dL (ref 6–24)
Bilirubin Total: 0.2 mg/dL (ref 0.0–1.2)
CO2: 19 mmol/L — ABNORMAL LOW (ref 20–29)
Calcium: 9 mg/dL (ref 8.7–10.2)
Chloride: 107 mmol/L — ABNORMAL HIGH (ref 96–106)
Creatinine, Ser: 0.63 mg/dL (ref 0.57–1.00)
Globulin, Total: 2.6 g/dL (ref 1.5–4.5)
Glucose: 115 mg/dL — ABNORMAL HIGH (ref 70–99)
Potassium: 4 mmol/L (ref 3.5–5.2)
Sodium: 140 mmol/L (ref 134–144)
Total Protein: 6.6 g/dL (ref 6.0–8.5)
eGFR: 111 mL/min/{1.73_m2} (ref >59)

## 2023-10-25 LAB — LIPID PANEL
Chol/HDL Ratio: 4 ratio (ref 0.0–4.4)
Cholesterol, Total: 105 mg/dL (ref 100–199)
HDL: 26 mg/dL — ABNORMAL LOW (ref >39)
LDL Chol Calc (NIH): 65 mg/dL (ref 0–99)
Triglycerides: 66 mg/dL (ref 0–149)
VLDL Cholesterol Cal: 14 mg/dL (ref 5–40)

## 2023-10-25 LAB — TSH RFX ON ABNORMAL TO FREE T4: TSH: 1.43 u[IU]/mL (ref 0.450–4.500)

## 2023-10-25 NOTE — Therapy (Signed)
 OUTPATIENT PHYSICAL THERAPY ONCOLOGY EVALUATION  Patient Name: Karen Hopkins MRN: [id number] DOB:[birth year], 48 y.o., female Today's Date: 10/25/2023   PT End of Session - 10/25/23 1446     Visit Number 1    Number of Visits 19    Date for PT Re-Evaluation 12/06/23    PT Start Time 1402    PT Stop Time 1446    PT Time Calculation (min) 44 min    Activity Tolerance Patient tolerated treatment well    Behavior During Therapy [facility] for tasks assessed/performed              Past Medical History:  Diagnosis Date   Anxiety    Arthritis    Cancer (HCC) 2011   Rt. Br. Ca   Depression    Diabetes mellitus without complication (HCC)    has been "borderline"   Essential hypertension 11/05/2015   H. pylori infection    History of radiation therapy    Hypertension    under control with med., has been on med. x 2 yr.   Lymphedema of arm    right; no BP or puncture to right arm   Personal history of chemotherapy 09/16/2009   rt breast   Pneumonia    Seasonal allergies    Sinus headache    Past Surgical History:  Procedure Laterality Date   BREAST BIOPSY Right 08/26/2009   BREAST REDUCTION SURGERY Left 08/23/2013   Procedure: LEFT BREAST REDUCTION  ;  Surgeon: Marilou Showman, DO;  Location: [facility];  Service: Plastics;  Laterality: Left;   BREAST REDUCTION WITH MASTOPEXY Left 08/22/2019   Procedure: Left breast mastopexy reduction with liposuction for breast symmetry;  Surgeon: Thornell Flirt, DO;  Location: [facility];  Service: Plastics;  Laterality: Left;   CESAREAN SECTION  07/15/2001; 03/18/2004; 11/21/2008   lateral orbiotomy Left 11/2015   Dr. Abel Abelson at [facility]   LATISSIMUS FLAP TO BREAST Right 03/12/2013   Procedure: RIGHT BREAST LATISSIMUS FLAP WITH EXPANDER PLACEMENT;  Surgeon: Marilou Showman, DO;  Location: [facility];  Service: Plastics;  Laterality: Right;   LIPOSUCTION Bilateral 08/23/2013   Procedure: LIPOSUCTION;  Surgeon: Marilou Showman, DO;  Location: [facility];  Service: Plastics;  Laterality: Bilateral;   LIPOSUCTION WITH LIPOFILLING Left 08/22/2019   Procedure: LIPOSUCTION WITH LIPOFILLING;  Surgeon: Thornell Flirt, DO;  Location: [facility];  Service: Plastics;  Laterality: Left;   MASTECTOMY Right 2011   MODIFIED RADICAL MASTECTOMY Right 03/11/2010   NASAL SEPTUM SURGERY     ORIF ANKLE FRACTURE Left 07/18/2018   Procedure: OPEN REDUCTION INTERNAL FIXATION (ORIF) LEFT ANKLE FRACTURE WITH  SYNDESMOSIS AND ANKLE ARTHROTOMY;  Surgeon: Donnamarie Gables, MD;  Location: [facility];  Service: Orthopedics;  Laterality: Left;   PORT-A-CATH REMOVAL Left 12/01/2010   PORTACATH PLACEMENT Left 09/12/2009   REDUCTION MAMMAPLASTY Left 08/2013   REMOVAL OF TISSUE EXPANDER AND PLACEMENT OF IMPLANT Right 08/23/2013   Procedure: REMOVAL RIGHT TISSUE EXPANDER AND PLACEMENT OF IMPLANT TO RIGHT BREAST ;  Surgeon: Marilou Showman, DO;  Location: [facility];  Service: Plastics;  Laterality: Right;   SUPRA-UMBILICAL HERNIA  2006   TUBAL LIGATION  11/21/2008   UMBILICAL HERNIA REPAIR  04/12/2022   CCS DR   Patient Active Problem List   Diagnosis Date Noted   Contraceptive, surveillance, intrauterine device 04/13/2023   Nephrolithiasis 10/21/2022   H. pylori infection 09/25/2022   Abdominal hernia 09/25/2022  Seasonal allergies 09/25/2022   Carpal tunnel syndrome 10/07/2021   Genetic testing 08/14/2019   Breast asymmetry following reconstructive surgery 07/13/2019   Family history of breast cancer    Family history of prostate cancer    Unintentional weight loss 05/21/2019   Lymphedema of right arm 02/27/2018   Dyspnea 09/23/2016   Eczema 08/31/2016   Essential hypertension 11/05/2015   Cervical dystonia 08/25/2015   Headache 07/09/2015   Myofascial muscle pain 04/29/2015   Adhesive capsulitis of right shoulder 04/29/2015   Lower extremity edema 10/01/2014    Malignant neoplasm of overlapping sites of right breast in female, estrogen receptor positive (HCC) 05/24/2014   Myalgia and myositis 05/17/2014   Neoplasm related pain 03/12/2014   Constipation 11/26/2013   H/O reduction mammoplasty 10/30/2013   Seizure (HCC) 10/06/2013   Status post bilateral breast implants 08/31/2013   S/P breast reconstruction, right 08/23/2013   AC joint arthropathy 07/02/2013   Routine adult health maintenance 05/29/2013   Hypokalemia 05/21/2013   Chronic pain 04/19/2013   Acquired absence of breast and absent nipple 01/30/2013   RHINOSINUSITIS, CHRONIC 04/08/2010   Morbid obesity (HCC) 08/04/2006   DEPRESSIVE DISORDER, NOS 08/04/2006    PCP: Daleen Dubs, MD  REFERRING PROVIDER: Candee Cha, MD   REFERRING DIAG: I89.0 (ICD-10-CM) - Lymphedema of right arm   THERAPY DIAG:  Postmastectomy lymphedema  Stiffness of right shoulder, not elsewhere classified  History of right breast cancer  ONSET DATE: 2011  Rationale for Evaluation and Treatment Rehabilitation  SUBJECTIVE                                                                                                                                                                                           SUBJECTIVE STATEMENT: I was unable to afford a sleeve before but I can now. I have been wearing the night time garment. I don't have a day sleeve.   PERTINENT HISTORY:  Right breast cancer with mastectomy 2011 and ALND 22 nodes; reconstruction; left breast reduction; h/o right UE lymphedema and right upper quadrant pain, treated in this clinic several times before.  PAIN:  Are you having pain? Yes NPRS scale: 7/10 Pain location: R axilla and RUE Pain orientation: Right  PAIN TYPE: aching, heaviness Pain description: intermittent  Aggravating factors: using the arm Relieving factors: pain medication  PRECAUTIONS: Other: R UE lymphedema, has metal in ankle  RED FLAGS:  none  WEIGHT BEARING RESTRICTIONS No  FALLS:  Has patient fallen in last 6 months? No  LIVING ENVIRONMENT: Lives with: lives with 3 sons Lives in: House/apartment Stairs: No;  Has following equipment at home: None  OCCUPATION: on disability  LEISURE: trying to exercise by walking but has been bothered by her L ankle pain  HAND DOMINANCE : right   PRIOR LEVEL OF FUNCTION: Independent  PATIENT GOALS to get my arm down and try to maintain it   OBJECTIVE  COGNITION:  Overall cognitive status: Within functional limits for tasks assessed    OBSERVATIONS / OTHER ASSESSMENTS: R upper arm considerable larger than left, area of swelling at posterior trunk just above band on bra  POSTURE: forward head, rounded shoulders  UPPER EXTREMITY AROM/PROM:  A/PROM RIGHT   eval   Shoulder extension 55  Shoulder flexion 133  Shoulder abduction 85  Shoulder internal rotation 40  Shoulder external rotation 60    (Blank rows = not tested)  A/PROM LEFT   eval  Shoulder extension 63  Shoulder flexion 175  Shoulder abduction 171  Shoulder internal rotation 64  Shoulder external rotation 95    (Blank rows = not tested)   LYMPHEDEMA ASSESSMENTS:   SURGERY TYPE/DATE: R mastectomy and ALND in 2011  NUMBER OF LYMPH NODES REMOVED: 1/20  CHEMOTHERAPY: completed  RADIATION:completed Jan 2012  HORMONE TREATMENT: anastrozole  from Nov 2016 to Dec 2019  INFECTIONS: none  LYMPHEDEMA ASSESSMENTS:   LANDMARK RIGHT  Eval on 03/16/22 10/25/23  10 cm proximal to olecranon process 47.1 50.7  Olecranon process 32.6 34.5  10 cm proximal to ulnar styloid process 23.6 26  Just proximal to ulnar styloid process 17.7 18  Across hand at thumb web space 21.4 22  At base of 2nd digit 6.1 6.2  (Blank rows = not tested)  LANDMARK LEFT  Eval on 03/16/22 10/25/23  10 cm proximal to olecranon process 43 46  Olecranon process 30 32.4  10 cm proximal to ulnar styloid process 24.4 26.1  Just  proximal to ulnar styloid process 17.3 18.6  Across hand at thumb web space 20.6 22.1  At base of 2nd digit 6.4 6.4  (Blank rows = not tested)   Flowsheet Row Outpatient Rehab from 10/25/2023 in [facility]  Lymphedema Life Impact Scale Total Score 72.06 %         TODAY'S TREATMENT  10/25/23: Showed pt example of tribute circaid night time garment, discussed need for circular knit daytime garment. Educated pt to bring in pictures of old compression pumps. Issued script for pt to obtain a new compression bra.   PATIENT EDUCATION:  Education details: new night time garments available, bring pictures of old compression pumps, need for flat knit daytime garment, need for compression bra Laye educated: Patient Education method: Explanation Education comprehension: verbalized understanding   HOME EXERCISE PROGRAM: None yet  ASSESSMENT:  CLINICAL IMPRESSION: Patient is a 48 y.o. female who was seen today for physical therapy evaluation and treatment for R upper extremity lymphedema following a R mastectomy and ALND in 2011. Pt has been seen by this clinic previously and returns due to flare up of lymphedema and need for new compression garments. She reports she has not been using her current compression pump because it is uncomfortable. She has decreased R shoulder ROM compared to the L.  She would benefit from skilled PT services to decrease RUE lymphedema, improve R shoulder ROM, and assist pt with obtaining appropriate compression garments.     OBJECTIVE IMPAIRMENTS decreased ROM and increased edema.   ACTIVITY LIMITATIONS carrying, lifting, and reach over head  PARTICIPATION LIMITATIONS: none  PERSONAL FACTORS Time since onset of injury/illness/exacerbation are also affecting patient's functional  outcome.   REHAB POTENTIAL: Good  CLINICAL DECISION MAKING: Stable/uncomplicated  EVALUATION COMPLEXITY: Low  GOALS: Goals reviewed with patient?  Yes  SHORT TERM GOALS: Target date: 11/15/23   Pt will demonstrate a 2 cm reduction in edema at 10 cm proximal to olecranon process.  Baseline:  Goal status: INITIAL  2.  Pt will demonstrate 110 degrees of R shoulder abduction to allow pt to reach out to the side.  Baseline: 151 Goal status: INITIAL  3.  Pt will obtain a compression bra for long term management of truncal edema especially posterior..  Baseline:  Goal status: INITIAL  4.  Pt will demonstrate 150 degrees of R shoulder flexion to allow her to reach overhead.  Baseline:  Goal status: INITIAL   LONG TERM GOALS: Target date: 12/06/23    Pt will demonstrate a 3 cm reduction in edema at 10 cm proximal to olecranon process.  Baseline:  Goal status: INITIAL  2.  Pt will demonstrate 165 degrees of R shoulder abduction to allow pt to reach out to the side.  Baseline:  Goal status: INITIAL  3.  Pt will obtain appropriate day time and night time garments for long term management of R UE lymphedema.  Baseline:  Goal status: INITIAL  4.  Pt will be able to independently manage her lymphedema through self MLD and compression garments.  Baseline:  Goal status: INITIAL  5.  Pt will demonstrate 165 degrees of R shoulder flexion to allow her to reach overhead.  Baseline:  Goal status: INITIAL   PLAN: PT FREQUENCY: 3x/week  PT DURATION: 6 weeks  PLANNED INTERVENTIONS: Therapeutic exercises, Therapeutic activity, Patient/Family education, Self Care, Joint mobilization, Orthotic/Fit training, Manual lymph drainage, Compression bandaging, Vasopneumatic device, Manual therapy, and Re-evaluation  PLAN FOR NEXT SESSION: did she get compression bra? Assess reidsleeve and compression pumps, begin complete CDT for RUE, add finger bandages if needed, eventually measure for garments, pt interested in circaid profile night time garment   [employer], PT 10/25/2023, 2:55 PM

## 2023-10-26 NOTE — Assessment & Plan Note (Signed)
 Referral placed to lymphedema rehab clinic

## 2023-10-26 NOTE — Assessment & Plan Note (Signed)
 Advised to get Tdap at her pharmacy

## 2023-10-26 NOTE — Assessment & Plan Note (Signed)
Refill Allegra

## 2023-10-26 NOTE — Assessment & Plan Note (Signed)
 Making good lifestyle changes with persistent efforts. Would benefit from adjunct of medical therapy to help with weight loss Start Zepbound  2.5 mg weekly.  Follow-up 4 weeks after getting the medication. Checking labs today: CMET, lipids, A1c, TSH Continue lifestyle changes.

## 2023-10-26 NOTE — Assessment & Plan Note (Signed)
 Referral placed back to oncology for routine follow-up and surveillance

## 2023-10-28 ENCOUNTER — Telehealth: Payer: Self-pay

## 2023-10-28 NOTE — Telephone Encounter (Signed)
 PA submitted by Aron Lard this morning.   Elsie Halo, RN

## 2023-10-28 NOTE — Telephone Encounter (Signed)
 Pharmacy Patient Advocate Encounter  Received notification from [employer] that Prior Authorization for ZEPBOUND  has been DENIED.  Full denial letter will be uploaded to the media tab. See denial reason below.  Your requested medication is an antiobesity medication that is excluded from Part D prescription coverage under Medicare rules, unless it is being used for a medicallyaccepted diagnosis approved by the Food and Drug Administration. Please refer to your Evidence of Coverage (EOC) section that references Part D drug coverage in your pharmacy plan documents for more information.   *Please note: Zepbound  may be covered when used for a medicallyaccepted indication. The Food and Drug Administration has approved Zepbound  for the treatment of moderate to severe obstructive sleep apnea in adults with obesity. Any request outside this indication is an exclusion.  PA #/Case ID/Reference #:  [id number]

## 2023-10-28 NOTE — Telephone Encounter (Signed)
 Pharmacy Patient Advocate Encounter   Received notification from CoverMyMeds that prior authorization for ZEPBOUND  is required/requested.   Insurance verification completed.   The patient is insured through [facility] .   PA required; PA submitted to above mentioned insurance via CoverMyMeds Key/confirmation #/EOC [id number]. Status is pending

## 2023-10-29 LAB — GLUCOSE, POCT (MANUAL RESULT ENTRY): POC Glucose: 97 mg/dL (ref 70–99)

## 2023-11-02 ENCOUNTER — Encounter: Payer: Self-pay | Admitting: Physical Therapy

## 2023-11-02 ENCOUNTER — Ambulatory Visit: Admitting: Physical Therapy

## 2023-11-02 DIAGNOSIS — Z853 Personal history of malignant neoplasm of breast: Secondary | ICD-10-CM

## 2023-11-02 DIAGNOSIS — I972 Postmastectomy lymphedema syndrome: Secondary | ICD-10-CM

## 2023-11-02 DIAGNOSIS — M25611 Stiffness of right shoulder, not elsewhere classified: Secondary | ICD-10-CM | POA: Diagnosis not present

## 2023-11-02 NOTE — Therapy (Signed)
 OUTPATIENT PHYSICAL THERAPY ONCOLOGY TREATMENT  Patient Name: Karen Hopkins MRN: [id number] DOB:1975-12-19, 48 y.o., female Today's Date: 11/02/2023   PT End of Session - 11/02/23 0906     Visit Number 2    Number of Visits 19    Date for PT Re-Evaluation 12/06/23    PT Start Time 0905    PT Stop Time 0954    PT Time Calculation (min) 49 min    Activity Tolerance Patient tolerated treatment well    Behavior During Therapy [facility] for tasks assessed/performed              Past Medical History:  Diagnosis Date   Anxiety    Arthritis    Cancer (HCC) 2011   Rt. Br. Ca   Depression    Diabetes mellitus without complication (HCC)    has been "borderline"   Essential hypertension 11/05/2015   H. pylori infection    History of radiation therapy    Hypertension    under control with med., has been on med. x 2 yr.   Lymphedema of arm    right; no BP or puncture to right arm   Personal history of chemotherapy 09/16/2009   rt breast   Pneumonia    Seasonal allergies    Sinus headache    Past Surgical History:  Procedure Laterality Date   BREAST BIOPSY Right 08/26/2009   BREAST REDUCTION SURGERY Left 08/23/2013   Procedure: LEFT BREAST REDUCTION  ;  Surgeon: Marilou Showman, DO;  Location: [facility];  Service: Plastics;  Laterality: Left;   BREAST REDUCTION WITH MASTOPEXY Left 08/22/2019   Procedure: Left breast mastopexy reduction with liposuction for breast symmetry;  Surgeon: Thornell Flirt, DO;  Location: [facility];  Service: Plastics;  Laterality: Left;   CESAREAN SECTION  07/15/2001; 03/18/2004; 11/21/2008   lateral orbiotomy Left 11/2015   Dr. Abel Abelson at [facility]   LATISSIMUS FLAP TO BREAST Right 03/12/2013   Procedure: RIGHT BREAST LATISSIMUS FLAP WITH EXPANDER PLACEMENT;  Surgeon: Marilou Showman, DO;  Location: [facility];  Service: Plastics;  Laterality: Right;   LIPOSUCTION Bilateral 08/23/2013   Procedure: LIPOSUCTION;  Surgeon: Marilou Showman, DO;  Location: [facility];  Service: Plastics;  Laterality: Bilateral;   LIPOSUCTION WITH LIPOFILLING Left 08/22/2019   Procedure: LIPOSUCTION WITH LIPOFILLING;  Surgeon: Thornell Flirt, DO;  Location: [facility];  Service: Plastics;  Laterality: Left;   MASTECTOMY Right 2011   MODIFIED RADICAL MASTECTOMY Right 03/11/2010   NASAL SEPTUM SURGERY     ORIF ANKLE FRACTURE Left 07/18/2018   Procedure: OPEN REDUCTION INTERNAL FIXATION (ORIF) LEFT ANKLE FRACTURE WITH  SYNDESMOSIS AND ANKLE ARTHROTOMY;  Surgeon: Donnamarie Gables, MD;  Location: [facility];  Service: Orthopedics;  Laterality: Left;   PORT-A-CATH REMOVAL Left 12/01/2010   PORTACATH PLACEMENT Left 09/12/2009   REDUCTION MAMMAPLASTY Left 08/2013   REMOVAL OF TISSUE EXPANDER AND PLACEMENT OF IMPLANT Right 08/23/2013   Procedure: REMOVAL RIGHT TISSUE EXPANDER AND PLACEMENT OF IMPLANT TO RIGHT BREAST ;  Surgeon: Marilou Showman, DO;  Location: [facility];  Service: Plastics;  Laterality: Right;   SUPRA-UMBILICAL HERNIA  2006   TUBAL LIGATION  11/21/2008   UMBILICAL HERNIA REPAIR  04/12/2022   CCS DR   Patient Active Problem List   Diagnosis Date Noted   Contraceptive, surveillance, intrauterine device 04/13/2023   Nephrolithiasis 10/21/2022   H. pylori infection 09/25/2022   Abdominal hernia 09/25/2022  Seasonal allergies 09/25/2022   Carpal tunnel syndrome 10/07/2021   Genetic testing 08/14/2019   Breast asymmetry following reconstructive surgery 07/13/2019   Family history of breast cancer    Family history of prostate cancer    Unintentional weight loss 05/21/2019   Lymphedema of right arm 02/27/2018   Dyspnea 09/23/2016   Eczema 08/31/2016   Essential hypertension 11/05/2015   Cervical dystonia 08/25/2015   Headache 07/09/2015   Myofascial muscle pain 04/29/2015   Adhesive capsulitis of right shoulder 04/29/2015   Lower extremity edema 10/01/2014    Malignant neoplasm of overlapping sites of right breast in female, estrogen receptor positive (HCC) 05/24/2014   Myalgia and myositis 05/17/2014   Neoplasm related pain 03/12/2014   Constipation 11/26/2013   H/O reduction mammoplasty 10/30/2013   Seizure (HCC) 10/06/2013   Status post bilateral breast implants 08/31/2013   S/P breast reconstruction, right 08/23/2013   AC joint arthropathy 07/02/2013   Routine adult health maintenance 05/29/2013   Hypokalemia 05/21/2013   Chronic pain 04/19/2013   Acquired absence of breast and absent nipple 01/30/2013   RHINOSINUSITIS, CHRONIC 04/08/2010   Morbid obesity (HCC) 08/04/2006   DEPRESSIVE DISORDER, NOS 08/04/2006    PCP: Daleen Dubs, MD  REFERRING PROVIDER: Candee Cha, MD   REFERRING DIAG: I89.0 (ICD-10-CM) - Lymphedema of right arm   THERAPY DIAG:  Postmastectomy lymphedema  Stiffness of right shoulder, not elsewhere classified  History of right breast cancer  ONSET DATE: 2011  Rationale for Evaluation and Treatment Rehabilitation  SUBJECTIVE                                                                                                                                                                                           SUBJECTIVE STATEMENT: I have to look for the pump in the attic.   PERTINENT HISTORY:  Right breast cancer with mastectomy 2011 and ALND 22 nodes; reconstruction; left breast reduction; h/o right UE lymphedema and right upper quadrant pain, treated in this clinic several times before.  PAIN:  Are you having pain? Yes NPRS scale: 5/10 Pain location: R axilla and RUE Pain orientation: Right  PAIN TYPE: aching, heaviness Pain description: intermittent  Aggravating factors: using the arm Relieving factors: pain medication  PRECAUTIONS: Other: R UE lymphedema, has metal in ankle  RED FLAGS: none  WEIGHT BEARING RESTRICTIONS No  FALLS:  Has patient fallen in last 6 months?  No  LIVING ENVIRONMENT: Lives with: lives with 3 sons Lives in: House/apartment Stairs: No;  Has following equipment at home: None  OCCUPATION: on disability  LEISURE: trying to exercise by walking but has been bothered by  her L ankle pain  HAND DOMINANCE : right   PRIOR LEVEL OF FUNCTION: Independent  PATIENT GOALS to get my arm down and try to maintain it   OBJECTIVE  COGNITION:  Overall cognitive status: Within functional limits for tasks assessed    OBSERVATIONS / OTHER ASSESSMENTS: R upper arm considerable larger than left, area of swelling at posterior trunk just above band on bra  POSTURE: forward head, rounded shoulders  UPPER EXTREMITY AROM/PROM:  A/PROM RIGHT   eval   Shoulder extension 55  Shoulder flexion 133  Shoulder abduction 85  Shoulder internal rotation 40  Shoulder external rotation 60    (Blank rows = not tested)  A/PROM LEFT   eval  Shoulder extension 63  Shoulder flexion 175  Shoulder abduction 171  Shoulder internal rotation 64  Shoulder external rotation 95    (Blank rows = not tested)   LYMPHEDEMA ASSESSMENTS:   SURGERY TYPE/DATE: R mastectomy and ALND in 2011  NUMBER OF LYMPH NODES REMOVED: 1/20  CHEMOTHERAPY: completed  RADIATION:completed Jan 2012  HORMONE TREATMENT: anastrozole  from Nov 2016 to Dec 2019  INFECTIONS: none  LYMPHEDEMA ASSESSMENTS:   LANDMARK RIGHT  Eval on 03/16/22 10/25/23  10 cm proximal to olecranon process 47.1 50.7  Olecranon process 32.6 34.5  10 cm proximal to ulnar styloid process 23.6 26  Just proximal to ulnar styloid process 17.7 18  Across hand at thumb web space 21.4 22  At base of 2nd digit 6.1 6.2  (Blank rows = not tested)  LANDMARK LEFT  Eval on 03/16/22 10/25/23  10 cm proximal to olecranon process 43 46  Olecranon process 30 32.4  10 cm proximal to ulnar styloid process 24.4 26.1  Just proximal to ulnar styloid process 17.3 18.6  Across hand at thumb web space 20.6 22.1  At  base of 2nd digit 6.4 6.4  (Blank rows = not tested)   Flowsheet Row Outpatient Rehab from 10/25/2023 in [facility]  Lymphedema Life Impact Scale Total Score 72.06 %         TODAY'S TREATMENT  11/02/23: In supine: Short neck, 5 diaphragmatic breaths, L axillary nodes and establishment of interaxillary pathway, R inguinal nodes and establishment of axilloinguinal pathway, then R UE working proximal to distal, moving inner upper arm outwards and upwards, and doing both sides of forearms, spending extra time in any areas of fibrosis then retracing all steps Compression bandaging to RUE: Thick stockinette from hand to axilla, artiflex from hand to antecubital fossa, rosidal soft from wrist to axilla, 1 6 cm at hand/wrist, 1 8 cm from wrist to just above antecubital fossa, 1 10 from wrist to upper arm, 1 12 cm from mid forearm to axilla in herringbone pattern. Educated pt to remove on Friday if she does not get an appt here on Friday. Did not wrap fingers as pt reports they never swell. Educated her to remove bandages immediately if fingers begin to swell.  10/25/23: Showed pt example of tribute circaid night time garment, discussed need for circular knit daytime garment. Educated pt to bring in pictures of old compression pumps. Issued script for pt to obtain a new compression bra.   PATIENT EDUCATION:  Education details: new night time garments available, bring pictures of old compression pumps, need for flat knit daytime garment, need for compression bra Wesch educated: Patient Education method: Explanation Education comprehension: verbalized understanding   HOME EXERCISE PROGRAM: None yet  ASSESSMENT:  CLINICAL IMPRESSION: Began completed CDT on  pt today. Pt currently does not have an appointment on Friday so educated pt to remove bandages on Friday and not to leave on until Monday. Also did not wrap fingers as pt reports her fingers never swell but educated pt  to remove bandages if she begins developing finger swelling.    OBJECTIVE IMPAIRMENTS decreased ROM and increased edema.   ACTIVITY LIMITATIONS carrying, lifting, and reach over head  PARTICIPATION LIMITATIONS: none  PERSONAL FACTORS Time since onset of injury/illness/exacerbation are also affecting patient's functional outcome.   REHAB POTENTIAL: Good  CLINICAL DECISION MAKING: Stable/uncomplicated  EVALUATION COMPLEXITY: Low  GOALS: Goals reviewed with patient? Yes  SHORT TERM GOALS: Target date: 11/15/23   Pt will demonstrate a 2 cm reduction in edema at 10 cm proximal to olecranon process.  Baseline:  Goal status: INITIAL  2.  Pt will demonstrate 110 degrees of R shoulder abduction to allow pt to reach out to the side.  Baseline: 151 Goal status: INITIAL  3.  Pt will obtain a compression bra for long term management of truncal edema especially posterior..  Baseline:  Goal status: INITIAL  4.  Pt will demonstrate 150 degrees of R shoulder flexion to allow her to reach overhead.  Baseline:  Goal status: INITIAL   LONG TERM GOALS: Target date: 12/06/23    Pt will demonstrate a 3 cm reduction in edema at 10 cm proximal to olecranon process.  Baseline:  Goal status: INITIAL  2.  Pt will demonstrate 165 degrees of R shoulder abduction to allow pt to reach out to the side.  Baseline:  Goal status: INITIAL  3.  Pt will obtain appropriate day time and night time garments for long term management of R UE lymphedema.  Baseline:  Goal status: INITIAL  4.  Pt will be able to independently manage her lymphedema through self MLD and compression garments.  Baseline:  Goal status: INITIAL  5.  Pt will demonstrate 165 degrees of R shoulder flexion to allow her to reach overhead.  Baseline:  Goal status: INITIAL   PLAN: PT FREQUENCY: 3x/week  PT DURATION: 6 weeks  PLANNED INTERVENTIONS: Therapeutic exercises, Therapeutic activity, Patient/Family education, Self Care,  Joint mobilization, Orthotic/Fit training, Manual lymph drainage, Compression bandaging, Vasopneumatic device, Manual therapy, and Re-evaluation  PLAN FOR NEXT SESSION: did she get compression bra?  CDT for RUE, add finger bandages if needed, eventually measure for garments, pt interested in circaid profile night time garment, has appt for a compression bra at [facility] on June 2   Emonie Espericueta Breedlove [place], PT 11/02/2023, 9:54 AM

## 2023-11-04 ENCOUNTER — Other Ambulatory Visit (HOSPITAL_COMMUNITY): Payer: Self-pay

## 2023-11-04 ENCOUNTER — Encounter: Payer: Self-pay | Admitting: Oncology

## 2023-11-04 ENCOUNTER — Encounter: Payer: Self-pay | Admitting: *Deleted

## 2023-11-04 NOTE — Congregational Nurse Program (Signed)
  Dept: [phone]   Congregational Nurse Program Note  Date of Encounter: 11/04/2023  Past Medical History: Past Medical History:  Diagnosis Date   Anxiety    Arthritis    Cancer (HCC) 2011   Rt. Br. Ca   Depression    Diabetes mellitus without complication (HCC)    has been "borderline"   Essential hypertension 11/05/2015   H. pylori infection    History of radiation therapy    Hypertension    under control with med., has been on med. x 2 yr.   Lymphedema of arm    right; no BP or puncture to right arm   Personal history of chemotherapy 09/16/2009   rt breast   Pneumonia    Seasonal allergies    Sinus headache     Encounter Details:  Attended a community screening event. Karen Hopkins

## 2023-11-07 ENCOUNTER — Ambulatory Visit: Attending: Family Medicine

## 2023-11-07 DIAGNOSIS — M25611 Stiffness of right shoulder, not elsewhere classified: Secondary | ICD-10-CM | POA: Insufficient documentation

## 2023-11-07 DIAGNOSIS — Z853 Personal history of malignant neoplasm of breast: Secondary | ICD-10-CM | POA: Diagnosis not present

## 2023-11-07 DIAGNOSIS — C50911 Malignant neoplasm of unspecified site of right female breast: Secondary | ICD-10-CM | POA: Diagnosis not present

## 2023-11-07 DIAGNOSIS — I972 Postmastectomy lymphedema syndrome: Secondary | ICD-10-CM | POA: Insufficient documentation

## 2023-11-07 DIAGNOSIS — I890 Lymphedema, not elsewhere classified: Secondary | ICD-10-CM | POA: Diagnosis not present

## 2023-11-07 DIAGNOSIS — Z9012 Acquired absence of left breast and nipple: Secondary | ICD-10-CM | POA: Diagnosis not present

## 2023-11-07 NOTE — Therapy (Addendum)
 OUTPATIENT PHYSICAL THERAPY ONCOLOGY TREATMENT  Patient Name: Karen Hopkins MRN: [id number] DOB:01-15-1976, 48 y.o., female Today's Date: 11/07/2023   PT End of Session - 11/07/23 1407     Visit Number 3    Number of Visits 19    Date for PT Re-Evaluation 12/06/23    PT Start Time 1403    PT Stop Time 1507    PT Time Calculation (min) 64 min    Activity Tolerance Patient tolerated treatment well    Behavior During Therapy [facility] for tasks assessed/performed              Past Medical History:  Diagnosis Date   Anxiety    Arthritis    Cancer (HCC) 2011   Rt. Br. Ca   Depression    Diabetes mellitus without complication (HCC)    has been "borderline"   Essential hypertension 11/05/2015   H. pylori infection    History of radiation therapy    Hypertension    under control with med., has been on med. x 2 yr.   Lymphedema of arm    right; no BP or puncture to right arm   Personal history of chemotherapy 09/16/2009   rt breast   Pneumonia    Seasonal allergies    Sinus headache    Past Surgical History:  Procedure Laterality Date   BREAST BIOPSY Right 08/26/2009   BREAST REDUCTION SURGERY Left 08/23/2013   Procedure: LEFT BREAST REDUCTION  ;  Surgeon: Marilou Showman, DO;  Location: [facility];  Service: Plastics;  Laterality: Left;   BREAST REDUCTION WITH MASTOPEXY Left 08/22/2019   Procedure: Left breast mastopexy reduction with liposuction for breast symmetry;  Surgeon: Thornell Flirt, DO;  Location: [facility];  Service: Plastics;  Laterality: Left;   CESAREAN SECTION  07/15/2001; 03/18/2004; 11/21/2008   lateral orbiotomy Left 11/2015   Dr. Abel Abelson at [facility]   LATISSIMUS FLAP TO BREAST Right 03/12/2013   Procedure: RIGHT BREAST LATISSIMUS FLAP WITH EXPANDER PLACEMENT;  Surgeon: Marilou Showman, DO;  Location: [facility];  Service: Plastics;  Laterality: Right;   LIPOSUCTION Bilateral 08/23/2013   Procedure: LIPOSUCTION;  Surgeon: Marilou Showman, DO;  Location: [facility];  Service: Plastics;  Laterality: Bilateral;   LIPOSUCTION WITH LIPOFILLING Left 08/22/2019   Procedure: LIPOSUCTION WITH LIPOFILLING;  Surgeon: Thornell Flirt, DO;  Location: [facility];  Service: Plastics;  Laterality: Left;   MASTECTOMY Right 2011   MODIFIED RADICAL MASTECTOMY Right 03/11/2010   NASAL SEPTUM SURGERY     ORIF ANKLE FRACTURE Left 07/18/2018   Procedure: OPEN REDUCTION INTERNAL FIXATION (ORIF) LEFT ANKLE FRACTURE WITH  SYNDESMOSIS AND ANKLE ARTHROTOMY;  Surgeon: Donnamarie Gables, MD;  Location: [facility];  Service: Orthopedics;  Laterality: Left;   PORT-A-CATH REMOVAL Left 12/01/2010   PORTACATH PLACEMENT Left 09/12/2009   REDUCTION MAMMAPLASTY Left 08/2013   REMOVAL OF TISSUE EXPANDER AND PLACEMENT OF IMPLANT Right 08/23/2013   Procedure: REMOVAL RIGHT TISSUE EXPANDER AND PLACEMENT OF IMPLANT TO RIGHT BREAST ;  Surgeon: Marilou Showman, DO;  Location: [facility];  Service: Plastics;  Laterality: Right;   SUPRA-UMBILICAL HERNIA  2006   TUBAL LIGATION  11/21/2008   UMBILICAL HERNIA REPAIR  04/12/2022   CCS DR   Patient Active Problem List   Diagnosis Date Noted   Contraceptive, surveillance, intrauterine device 04/13/2023   Nephrolithiasis 10/21/2022   H. pylori infection 09/25/2022   Abdominal hernia 09/25/2022  Seasonal allergies 09/25/2022   Carpal tunnel syndrome 10/07/2021   Genetic testing 08/14/2019   Breast asymmetry following reconstructive surgery 07/13/2019   Family history of breast cancer    Family history of prostate cancer    Unintentional weight loss 05/21/2019   Lymphedema of right arm 02/27/2018   Dyspnea 09/23/2016   Eczema 08/31/2016   Essential hypertension 11/05/2015   Cervical dystonia 08/25/2015   Headache 07/09/2015   Myofascial muscle pain 04/29/2015   Adhesive capsulitis of right shoulder 04/29/2015   Lower extremity edema 10/01/2014    Malignant neoplasm of overlapping sites of right breast in female, estrogen receptor positive (HCC) 05/24/2014   Myalgia and myositis 05/17/2014   Neoplasm related pain 03/12/2014   Constipation 11/26/2013   H/O reduction mammoplasty 10/30/2013   Seizure (HCC) 10/06/2013   Status post bilateral breast implants 08/31/2013   S/P breast reconstruction, right 08/23/2013   AC joint arthropathy 07/02/2013   Routine adult health maintenance 05/29/2013   Hypokalemia 05/21/2013   Chronic pain 04/19/2013   Acquired absence of breast and absent nipple 01/30/2013   RHINOSINUSITIS, CHRONIC 04/08/2010   Morbid obesity (HCC) 08/04/2006   DEPRESSIVE DISORDER, NOS 08/04/2006    PCP: Daleen Dubs, MD  REFERRING PROVIDER: Candee Cha, MD   REFERRING DIAG: I89.0 (ICD-10-CM) - Lymphedema of right arm   THERAPY DIAG:  Postmastectomy lymphedema  Stiffness of right shoulder, not elsewhere classified  History of right breast cancer  ONSET DATE: 2011  Rationale for Evaluation and Treatment Rehabilitation  SUBJECTIVE                                                                                                                                                                                           SUBJECTIVE STATEMENT: I left the last bandage on for a few days. No problems with that. I go to [facility] today at 4 for compression bras.   PERTINENT HISTORY:  Right breast cancer with mastectomy 2011 and ALND 22 nodes; reconstruction; left breast reduction; h/o right UE lymphedema and right upper quadrant pain, treated in this clinic several times before.  PAIN:  Are you having pain? Yes NPRS scale: 5/10 Pain location: R axilla and RUE Pain orientation: Right  PAIN TYPE: aching, heaviness Pain description: intermittent  Aggravating factors: using the arm, and it just hurts often Relieving factors: pain medication  PRECAUTIONS: Other: R UE lymphedema, has metal in  ankle  RED FLAGS: none  WEIGHT BEARING RESTRICTIONS No  FALLS:  Has patient fallen in last 6 months? No  LIVING ENVIRONMENT: Lives with: lives with 3 sons Lives in: House/apartment Stairs: No;  Has  following equipment at home: None  OCCUPATION: on disability  LEISURE: trying to exercise by walking but has been bothered by her L ankle pain  HAND DOMINANCE : right   PRIOR LEVEL OF FUNCTION: Independent  PATIENT GOALS to get my arm down and try to maintain it   OBJECTIVE  COGNITION:  Overall cognitive status: Within functional limits for tasks assessed    OBSERVATIONS / OTHER ASSESSMENTS: R upper arm considerable larger than left, area of swelling at posterior trunk just above band on bra  POSTURE: forward head, rounded shoulders  UPPER EXTREMITY AROM/PROM:  A/PROM RIGHT   eval   Shoulder extension 55  Shoulder flexion 133  Shoulder abduction 85  Shoulder internal rotation 40  Shoulder external rotation 60    (Blank rows = not tested)  A/PROM LEFT   eval  Shoulder extension 63  Shoulder flexion 175  Shoulder abduction 171  Shoulder internal rotation 64  Shoulder external rotation 95    (Blank rows = not tested)   LYMPHEDEMA ASSESSMENTS:   SURGERY TYPE/DATE: R mastectomy and ALND in 2011  NUMBER OF LYMPH NODES REMOVED: 1/20  CHEMOTHERAPY: completed  RADIATION:completed Jan 2012  HORMONE TREATMENT: anastrozole  from Nov 2016 to Dec 2019  INFECTIONS: none  LYMPHEDEMA ASSESSMENTS:   LANDMARK RIGHT  Eval on 03/16/22 10/25/23  10 cm proximal to olecranon process 47.1 50.7  Olecranon process 32.6 34.5  10 cm proximal to ulnar styloid process 23.6 26  Just proximal to ulnar styloid process 17.7 18  Across hand at thumb web space 21.4 22  At base of 2nd digit 6.1 6.2  (Blank rows = not tested)  LANDMARK LEFT  Eval on 03/16/22 10/25/23  10 cm proximal to olecranon process 43 46  Olecranon process 30 32.4  10 cm proximal to ulnar styloid process  24.4 26.1  Just proximal to ulnar styloid process 17.3 18.6  Across hand at thumb web space 20.6 22.1  At base of 2nd digit 6.4 6.4  (Blank rows = not tested)   Flowsheet Row Outpatient Rehab from 10/25/2023 in [facility]  Lymphedema Life Impact Scale Total Score 72.06 %         TODAY'S TREATMENT  11/07/23: Manual Therapy In supine: Short neck, superficial abdominals and 5 diaphragmatic breaths (avoided deep abdominals due to pt had just had lunch), Lt axillary and pectoral nodes and establishment of anterior inter-axillary pathway, Rt inguinal nodes and establishment of Rt axillo-inguinal pathway, then R UE working proximal to distal, moving inner upper arm outwards and upwards, and doing both sides of forearms, then retracing all steps P/ROM to Rt shoulder in supine during MLD into flex, abd, and D2 with scapular depression by therapist throughout STM to Rt lateral trunk and axilla where multiple areas of scar tissue and tightness palpable.  Compression bandaging to RUE: Cocoa butter, thick stockinette from hand to axilla, artiflex to antecubital fossa, rosidal soft from wrist to axilla, Molelast to fingers 1-4, artiflex to hand, then 1/2" gray foam to dorsal hand with 1 6 cm at hand/wrist, 1 8 cm spiral with "X" at elbow, 1 10 herringbone from wrist to upper arm, 1 12 cm spiral from wrist to axilla.   11/02/23: In supine: Short neck, 5 diaphragmatic breaths, L axillary nodes and establishment of interaxillary pathway, R inguinal nodes and establishment of axilloinguinal pathway, then R UE working proximal to distal, moving inner upper arm outwards and upwards, and doing both sides of forearms, spending extra time in  any areas of fibrosis then retracing all steps Compression bandaging to RUE: Thick stockinette from hand to axilla, artiflex from hand to antecubital fossa, rosidal soft from wrist to axilla, 1 6 cm at hand/wrist, 1 8 cm from wrist to just above  antecubital fossa, 1 10 from wrist to upper arm, 1 12 cm from mid forearm to axilla in herringbone pattern. Educated pt to remove on Friday if she does not get an appt here on Friday. Did not wrap fingers as pt reports they never swell. Educated her to remove bandages immediately if fingers begin to swell.  10/25/23: Showed pt example of tribute circaid night time garment, discussed need for circular knit daytime garment. Educated pt to bring in pictures of old compression pumps. Issued script for pt to obtain a new compression bra.   PATIENT EDUCATION:  Education details: new night time garments available, bring pictures of old compression pumps, need for flat knit daytime garment, need for compression bra [place] educated: Patient Education method: Explanation Education comprehension: verbalized understanding   HOME EXERCISE PROGRAM: None yet  ASSESSMENT:  CLINICAL IMPRESSION: Pt left bandages on for a few days as instructed. Continued CDT to Rt UE and added 1/2" gray compression foam to dorsal hand. Pt reports bandages comfortable.    OBJECTIVE IMPAIRMENTS decreased ROM and increased edema.   ACTIVITY LIMITATIONS carrying, lifting, and reach over head  PARTICIPATION LIMITATIONS: none  PERSONAL FACTORS Time since onset of injury/illness/exacerbation are also affecting patient's functional outcome.   REHAB POTENTIAL: Good  CLINICAL DECISION MAKING: Stable/uncomplicated  EVALUATION COMPLEXITY: Low  GOALS: Goals reviewed with patient? Yes  SHORT TERM GOALS: Target date: 11/15/23   Pt will demonstrate a 2 cm reduction in edema at 10 cm proximal to olecranon process.  Baseline:  Goal status: INITIAL  2.  Pt will demonstrate 110 degrees of R shoulder abduction to allow pt to reach out to the side.  Baseline: 151 Goal status: INITIAL  3.  Pt will obtain a compression bra for long term management of truncal edema especially posterior..  Baseline:  Goal status: INITIAL  4.  Pt  will demonstrate 150 degrees of R shoulder flexion to allow her to reach overhead.  Baseline:  Goal status: INITIAL   LONG TERM GOALS: Target date: 12/06/23    Pt will demonstrate a 3 cm reduction in edema at 10 cm proximal to olecranon process.  Baseline:  Goal status: INITIAL  2.  Pt will demonstrate 165 degrees of R shoulder abduction to allow pt to reach out to the side.  Baseline:  Goal status: INITIAL  3.  Pt will obtain appropriate day time and night time garments for long term management of R UE lymphedema.  Baseline:  Goal status: INITIAL  4.  Pt will be able to independently manage her lymphedema through self MLD and compression garments.  Baseline:  Goal status: INITIAL  5.  Pt will demonstrate 165 degrees of R shoulder flexion to allow her to reach overhead.  Baseline:  Goal status: INITIAL   PLAN: PT FREQUENCY: 3x/week  PT DURATION: 6 weeks  PLANNED INTERVENTIONS: Therapeutic exercises, Therapeutic activity, Patient/Family education, Self Care, Joint mobilization, Orthotic/Fit training, Manual lymph drainage, Compression bandaging, Vasopneumatic device, Manual therapy, and Re-evaluation  PLAN FOR NEXT SESSION: How are new compression bras? How was hand foam? Cont CDT for Rt UE,  eventually measure for garments, pt interested in circaid profile night time garment, has appt for a compression bra at [facility] on June 2  Denyce Flank, PTA 11/07/2023, 3:27 PM

## 2023-11-08 DIAGNOSIS — C50911 Malignant neoplasm of unspecified site of right female breast: Secondary | ICD-10-CM | POA: Diagnosis not present

## 2023-11-09 ENCOUNTER — Ambulatory Visit: Admitting: Physical Therapy

## 2023-11-09 DIAGNOSIS — M25611 Stiffness of right shoulder, not elsewhere classified: Secondary | ICD-10-CM

## 2023-11-09 DIAGNOSIS — Z853 Personal history of malignant neoplasm of breast: Secondary | ICD-10-CM | POA: Diagnosis not present

## 2023-11-09 DIAGNOSIS — I972 Postmastectomy lymphedema syndrome: Secondary | ICD-10-CM

## 2023-11-09 NOTE — Therapy (Signed)
 OUTPATIENT PHYSICAL THERAPY ONCOLOGY TREATMENT  Patient Name: Karen Hopkins MRN: [id number] DOB:06/17/1975, 48 y.o., female Today's Date: 11/09/2023   PT End of Session - 11/09/23 1156     Visit Number 4    Number of Visits 19    Date for PT Re-Evaluation 12/06/23    PT Start Time 1103    PT Stop Time 1156    PT Time Calculation (min) 53 min    Activity Tolerance Patient tolerated treatment well    Behavior During Therapy [facility] for tasks assessed/performed               Past Medical History:  Diagnosis Date   Anxiety    Arthritis    Cancer (HCC) 2011   Rt. Br. Ca   Depression    Diabetes mellitus without complication (HCC)    has been "borderline"   Essential hypertension 11/05/2015   H. pylori infection    History of radiation therapy    Hypertension    under control with med., has been on med. x 2 yr.   Lymphedema of arm    right; no BP or puncture to right arm   Personal history of chemotherapy 09/16/2009   rt breast   Pneumonia    Seasonal allergies    Sinus headache    Past Surgical History:  Procedure Laterality Date   BREAST BIOPSY Right 08/26/2009   BREAST REDUCTION SURGERY Left 08/23/2013   Procedure: LEFT BREAST REDUCTION  ;  Surgeon: Marilou Showman, DO;  Location: [facility];  Service: Plastics;  Laterality: Left;   BREAST REDUCTION WITH MASTOPEXY Left 08/22/2019   Procedure: Left breast mastopexy reduction with liposuction for breast symmetry;  Surgeon: Thornell Flirt, DO;  Location: [facility];  Service: Plastics;  Laterality: Left;   CESAREAN SECTION  07/15/2001; 03/18/2004; 11/21/2008   lateral orbiotomy Left 11/2015   Dr. Abel Abelson at [facility]   LATISSIMUS FLAP TO BREAST Right 03/12/2013   Procedure: RIGHT BREAST LATISSIMUS FLAP WITH EXPANDER PLACEMENT;  Surgeon: Marilou Showman, DO;  Location: [facility];  Service: Plastics;  Laterality: Right;   LIPOSUCTION Bilateral 08/23/2013   Procedure: LIPOSUCTION;  Surgeon: Marilou Showman, DO;  Location: [facility];  Service: Plastics;  Laterality: Bilateral;   LIPOSUCTION WITH LIPOFILLING Left 08/22/2019   Procedure: LIPOSUCTION WITH LIPOFILLING;  Surgeon: Thornell Flirt, DO;  Location: [facility];  Service: Plastics;  Laterality: Left;   MASTECTOMY Right 2011   MODIFIED RADICAL MASTECTOMY Right 03/11/2010   NASAL SEPTUM SURGERY     ORIF ANKLE FRACTURE Left 07/18/2018   Procedure: OPEN REDUCTION INTERNAL FIXATION (ORIF) LEFT ANKLE FRACTURE WITH  SYNDESMOSIS AND ANKLE ARTHROTOMY;  Surgeon: Donnamarie Gables, MD;  Location: [facility];  Service: Orthopedics;  Laterality: Left;   PORT-A-CATH REMOVAL Left 12/01/2010   PORTACATH PLACEMENT Left 09/12/2009   REDUCTION MAMMAPLASTY Left 08/2013   REMOVAL OF TISSUE EXPANDER AND PLACEMENT OF IMPLANT Right 08/23/2013   Procedure: REMOVAL RIGHT TISSUE EXPANDER AND PLACEMENT OF IMPLANT TO RIGHT BREAST ;  Surgeon: Marilou Showman, DO;  Location: [facility];  Service: Plastics;  Laterality: Right;   SUPRA-UMBILICAL HERNIA  2006   TUBAL LIGATION  11/21/2008   UMBILICAL HERNIA REPAIR  04/12/2022   CCS DR   Patient Active Problem List   Diagnosis Date Noted   Contraceptive, surveillance, intrauterine device 04/13/2023   Nephrolithiasis 10/21/2022   H. pylori infection 09/25/2022   Abdominal hernia 09/25/2022  Seasonal allergies 09/25/2022   Carpal tunnel syndrome 10/07/2021   Genetic testing 08/14/2019   Breast asymmetry following reconstructive surgery 07/13/2019   Family history of breast cancer    Family history of prostate cancer    Unintentional weight loss 05/21/2019   Lymphedema of right arm 02/27/2018   Dyspnea 09/23/2016   Eczema 08/31/2016   Essential hypertension 11/05/2015   Cervical dystonia 08/25/2015   Headache 07/09/2015   Myofascial muscle pain 04/29/2015   Adhesive capsulitis of right shoulder 04/29/2015   Lower extremity edema 10/01/2014    Malignant neoplasm of overlapping sites of right breast in female, estrogen receptor positive (HCC) 05/24/2014   Myalgia and myositis 05/17/2014   Neoplasm related pain 03/12/2014   Constipation 11/26/2013   H/O reduction mammoplasty 10/30/2013   Seizure (HCC) 10/06/2013   Status post bilateral breast implants 08/31/2013   S/P breast reconstruction, right 08/23/2013   AC joint arthropathy 07/02/2013   Routine adult health maintenance 05/29/2013   Hypokalemia 05/21/2013   Chronic pain 04/19/2013   Acquired absence of breast and absent nipple 01/30/2013   RHINOSINUSITIS, CHRONIC 04/08/2010   Morbid obesity (HCC) 08/04/2006   DEPRESSIVE DISORDER, NOS 08/04/2006    PCP: Daleen Dubs, MD  REFERRING PROVIDER: Candee Cha, MD   REFERRING DIAG: I89.0 (ICD-10-CM) - Lymphedema of right arm   THERAPY DIAG:  Postmastectomy lymphedema  Stiffness of right shoulder, not elsewhere classified  History of right breast cancer  ONSET DATE: 2011  Rationale for Evaluation and Treatment Rehabilitation  SUBJECTIVE                                                                                                                                                                                           SUBJECTIVE STATEMENT: I got 3 compression bras. One has the ridges and the other 2 do not.   PERTINENT HISTORY:  Right breast cancer with mastectomy 2011 and ALND 22 nodes; reconstruction; left breast reduction; h/o right UE lymphedema and right upper quadrant pain, treated in this clinic several times before.  PAIN:  Are you having pain? Yes NPRS scale: 5/10 Pain location: R axilla and RUE Pain orientation: Right  PAIN TYPE: aching, heaviness Pain description: intermittent  Aggravating factors: using the arm, and it just hurts often Relieving factors: pain medication  PRECAUTIONS: Other: R UE lymphedema, has metal in ankle  RED FLAGS: none  WEIGHT BEARING RESTRICTIONS  No  FALLS:  Has patient fallen in last 6 months? No  LIVING ENVIRONMENT: Lives with: lives with 3 sons Lives in: House/apartment Stairs: No;  Has following equipment at home: None  OCCUPATION: on disability  LEISURE:  trying to exercise by walking but has been bothered by her L ankle pain  HAND DOMINANCE : right   PRIOR LEVEL OF FUNCTION: Independent  PATIENT GOALS to get my arm down and try to maintain it   OBJECTIVE  COGNITION:  Overall cognitive status: Within functional limits for tasks assessed    OBSERVATIONS / OTHER ASSESSMENTS: R upper arm considerable larger than left, area of swelling at posterior trunk just above band on bra  POSTURE: forward head, rounded shoulders  UPPER EXTREMITY AROM/PROM:  A/PROM RIGHT   eval   Shoulder extension 55  Shoulder flexion 133  Shoulder abduction 85  Shoulder internal rotation 40  Shoulder external rotation 60    (Blank rows = not tested)  A/PROM LEFT   eval  Shoulder extension 63  Shoulder flexion 175  Shoulder abduction 171  Shoulder internal rotation 64  Shoulder external rotation 95    (Blank rows = not tested)   LYMPHEDEMA ASSESSMENTS:   SURGERY TYPE/DATE: R mastectomy and ALND in 2011  NUMBER OF LYMPH NODES REMOVED: 1/20  CHEMOTHERAPY: completed  RADIATION:completed Jan 2012  HORMONE TREATMENT: anastrozole  from Nov 2016 to Dec 2019  INFECTIONS: none  LYMPHEDEMA ASSESSMENTS:   LANDMARK RIGHT  Eval on 03/16/22 10/25/23  10 cm proximal to olecranon process 47.1 50.7  Olecranon process 32.6 34.5  10 cm proximal to ulnar styloid process 23.6 26  Just proximal to ulnar styloid process 17.7 18  Across hand at thumb web space 21.4 22  At base of 2nd digit 6.1 6.2  (Blank rows = not tested)  LANDMARK LEFT  Eval on 03/16/22 10/25/23  10 cm proximal to olecranon process 43 46  Olecranon process 30 32.4  10 cm proximal to ulnar styloid process 24.4 26.1  Just proximal to ulnar styloid process 17.3  18.6  Across hand at thumb web space 20.6 22.1  At base of 2nd digit 6.4 6.4  (Blank rows = not tested)   Flowsheet Row Outpatient Rehab from 10/25/2023 in [facility]  Lymphedema Life Impact Scale Total Score 72.06 %         TODAY'S TREATMENT  11/09/23: Manual Therapy In supine: Short neck, 5 diaphragmatic breaths , Lt axillary and pectoral nodes and establishment of anterior inter-axillary pathway, Rt inguinal nodes and establishment of Rt axillo-inguinal pathway, then R UE working proximal to distal, moving inner upper arm outwards and upwards, and doing both sides of forearms, then retracing all steps P/ROM to Rt shoulder in supine during MLD into flex, abd, and D2  STM to Rt axilla where multiple areas of scar tissue and tightness palpable.  Compression bandaging to RUE: lotion, thick stockinette from hand to axilla, artiflex to antecubital fossa, rosidal soft from wrist to axilla, Molelast to fingers 1-4, artiflex to hand, then 1/2" gray foam to dorsal hand with 1 6 cm at hand/wrist, 1 8 cm spiral with "X" at elbow, 1 10 herringbone from wrist to upper arm, 1 12 cm spiral from wrist to axilla.    11/07/23: Manual Therapy In supine: Short neck, superficial abdominals and 5 diaphragmatic breaths (avoided deep abdominals due to pt had just had lunch), Lt axillary and pectoral nodes and establishment of anterior inter-axillary pathway, Rt inguinal nodes and establishment of Rt axillo-inguinal pathway, then R UE working proximal to distal, moving inner upper arm outwards and upwards, and doing both sides of forearms, then retracing all steps P/ROM to Rt shoulder in supine during MLD into flex, abd, and  D2 with scapular depression by therapist throughout STM to Rt lateral trunk and axilla where multiple areas of scar tissue and tightness palpable.  Compression bandaging to RUE: Cocoa butter, thick stockinette from hand to axilla, artiflex to antecubital fossa,  rosidal soft from wrist to axilla, Molelast to fingers 1-4, artiflex to hand, then 1/2" gray foam to dorsal hand with 1 6 cm at hand/wrist, 1 8 cm spiral with "X" at elbow, 1 10 herringbone from wrist to upper arm, 1 12 cm spiral from wrist to axilla.   11/02/23: In supine: Short neck, 5 diaphragmatic breaths, L axillary nodes and establishment of interaxillary pathway, R inguinal nodes and establishment of axilloinguinal pathway, then R UE working proximal to distal, moving inner upper arm outwards and upwards, and doing both sides of forearms, spending extra time in any areas of fibrosis then retracing all steps Compression bandaging to RUE: Thick stockinette from hand to axilla, artiflex from hand to antecubital fossa, rosidal soft from wrist to axilla, 1 6 cm at hand/wrist, 1 8 cm from wrist to just above antecubital fossa, 1 10 from wrist to upper arm, 1 12 cm from mid forearm to axilla in herringbone pattern. Educated pt to remove on Friday if she does not get an appt here on Friday. Did not wrap fingers as pt reports they never swell. Educated her to remove bandages immediately if fingers begin to swell.  10/25/23: Showed pt example of tribute circaid night time garment, discussed need for circular knit daytime garment. Educated pt to bring in pictures of old compression pumps. Issued script for pt to obtain a new compression bra.   PATIENT EDUCATION:  Education details: new night time garments available, bring pictures of old compression pumps, need for flat knit daytime garment, need for compression bra Decker educated: Patient Education method: Explanation Education comprehension: verbalized understanding   HOME EXERCISE PROGRAM: None yet  ASSESSMENT:  CLINICAL IMPRESSION: Pt reports improved comfort when wearing finger bandages so continued bandaging fingers today. Continued complete CDT. Pt reports her arm is starting to feel better. She got new compression bras and they fit well and pt  reported they felt comfortable. The one she was wearing this visit demonstrated good coverage over area of swelling at posterior axilla/shoulder.    OBJECTIVE IMPAIRMENTS decreased ROM and increased edema.   ACTIVITY LIMITATIONS carrying, lifting, and reach over head  PARTICIPATION LIMITATIONS: none  PERSONAL FACTORS Time since onset of injury/illness/exacerbation are also affecting patient's functional outcome.   REHAB POTENTIAL: Good  CLINICAL DECISION MAKING: Stable/uncomplicated  EVALUATION COMPLEXITY: Low  GOALS: Goals reviewed with patient? Yes  SHORT TERM GOALS: Target date: 11/15/23   Pt will demonstrate a 2 cm reduction in edema at 10 cm proximal to olecranon process.  Baseline:  Goal status: INITIAL  2.  Pt will demonstrate 110 degrees of R shoulder abduction to allow pt to reach out to the side.  Baseline: 151 Goal status: INITIAL  3.  Pt will obtain a compression bra for long term management of truncal edema especially posterior..  Baseline:  Goal status: INITIAL  4.  Pt will demonstrate 150 degrees of R shoulder flexion to allow her to reach overhead.  Baseline:  Goal status: INITIAL   LONG TERM GOALS: Target date: 12/06/23    Pt will demonstrate a 3 cm reduction in edema at 10 cm proximal to olecranon process.  Baseline:  Goal status: INITIAL  2.  Pt will demonstrate 165 degrees of R shoulder abduction to allow pt  to reach out to the side.  Baseline:  Goal status: INITIAL  3.  Pt will obtain appropriate day time and night time garments for long term management of R UE lymphedema.  Baseline:  Goal status: INITIAL  4.  Pt will be able to independently manage her lymphedema through self MLD and compression garments.  Baseline:  Goal status: INITIAL  5.  Pt will demonstrate 165 degrees of R shoulder flexion to allow her to reach overhead.  Baseline:  Goal status: INITIAL   PLAN: PT FREQUENCY: 3x/week  PT DURATION: 6 weeks  PLANNED  INTERVENTIONS: Therapeutic exercises, Therapeutic activity, Patient/Family education, Self Care, Joint mobilization, Orthotic/Fit training, Manual lymph drainage, Compression bandaging, Vasopneumatic device, Manual therapy, and Re-evaluation  PLAN FOR NEXT SESSION: Cont CDT for Rt UE,  eventually measure for garments, pt interested in circaid profile night time garment, has appt for a compression bra at [facility] on June 2   [facility], PT 11/09/2023, 12:00 PM

## 2023-11-11 ENCOUNTER — Ambulatory Visit

## 2023-11-11 DIAGNOSIS — Z853 Personal history of malignant neoplasm of breast: Secondary | ICD-10-CM

## 2023-11-11 DIAGNOSIS — M25611 Stiffness of right shoulder, not elsewhere classified: Secondary | ICD-10-CM

## 2023-11-11 DIAGNOSIS — I972 Postmastectomy lymphedema syndrome: Secondary | ICD-10-CM | POA: Diagnosis not present

## 2023-11-11 NOTE — Therapy (Signed)
 OUTPATIENT PHYSICAL THERAPY ONCOLOGY TREATMENT  Patient Name: Karen Hopkins MRN: [id number] DOB:26-Apr-1976, 48 y.o., female Today's Date: 11/11/2023   PT End of Session - 11/11/23 0818     Visit Number 5    Number of Visits 19    Date for PT Re-Evaluation 12/06/23    PT Start Time 0804    PT Stop Time 0908    PT Time Calculation (min) 64 min    Activity Tolerance Patient tolerated treatment well    Behavior During Therapy [facility] for tasks assessed/performed               Past Medical History:  Diagnosis Date   Anxiety    Arthritis    Cancer (HCC) 2011   Rt. Br. Ca   Depression    Diabetes mellitus without complication (HCC)    has been "borderline"   Essential hypertension 11/05/2015   H. pylori infection    History of radiation therapy    Hypertension    under control with med., has been on med. x 2 yr.   Lymphedema of arm    right; no BP or puncture to right arm   Personal history of chemotherapy 09/16/2009   rt breast   Pneumonia    Seasonal allergies    Sinus headache    Past Surgical History:  Procedure Laterality Date   BREAST BIOPSY Right 08/26/2009   BREAST REDUCTION SURGERY Left 08/23/2013   Procedure: LEFT BREAST REDUCTION  ;  Surgeon: Marilou Showman, DO;  Location: [facility];  Service: Plastics;  Laterality: Left;   BREAST REDUCTION WITH MASTOPEXY Left 08/22/2019   Procedure: Left breast mastopexy reduction with liposuction for breast symmetry;  Surgeon: Thornell Flirt, DO;  Location: [facility];  Service: Plastics;  Laterality: Left;   CESAREAN SECTION  07/15/2001; 03/18/2004; 11/21/2008   lateral orbiotomy Left 11/2015   Dr. Abel Abelson at [facility]   LATISSIMUS FLAP TO BREAST Right 03/12/2013   Procedure: RIGHT BREAST LATISSIMUS FLAP WITH EXPANDER PLACEMENT;  Surgeon: Marilou Showman, DO;  Location: [facility];  Service: Plastics;  Laterality: Right;   LIPOSUCTION Bilateral 08/23/2013   Procedure: LIPOSUCTION;  Surgeon: Marilou Showman, DO;  Location: [facility];  Service: Plastics;  Laterality: Bilateral;   LIPOSUCTION WITH LIPOFILLING Left 08/22/2019   Procedure: LIPOSUCTION WITH LIPOFILLING;  Surgeon: Thornell Flirt, DO;  Location: [facility];  Service: Plastics;  Laterality: Left;   MASTECTOMY Right 2011   MODIFIED RADICAL MASTECTOMY Right 03/11/2010   NASAL SEPTUM SURGERY     ORIF ANKLE FRACTURE Left 07/18/2018   Procedure: OPEN REDUCTION INTERNAL FIXATION (ORIF) LEFT ANKLE FRACTURE WITH  SYNDESMOSIS AND ANKLE ARTHROTOMY;  Surgeon: Donnamarie Gables, MD;  Location: [facility];  Service: Orthopedics;  Laterality: Left;   PORT-A-CATH REMOVAL Left 12/01/2010   PORTACATH PLACEMENT Left 09/12/2009   REDUCTION MAMMAPLASTY Left 08/2013   REMOVAL OF TISSUE EXPANDER AND PLACEMENT OF IMPLANT Right 08/23/2013   Procedure: REMOVAL RIGHT TISSUE EXPANDER AND PLACEMENT OF IMPLANT TO RIGHT BREAST ;  Surgeon: Marilou Showman, DO;  Location: [facility];  Service: Plastics;  Laterality: Right;   SUPRA-UMBILICAL HERNIA  2006   TUBAL LIGATION  11/21/2008   UMBILICAL HERNIA REPAIR  04/12/2022   CCS DR   Patient Active Problem List   Diagnosis Date Noted   Contraceptive, surveillance, intrauterine device 04/13/2023   Nephrolithiasis 10/21/2022   H. pylori infection 09/25/2022   Abdominal hernia 09/25/2022  Seasonal allergies 09/25/2022   Carpal tunnel syndrome 10/07/2021   Genetic testing 08/14/2019   Breast asymmetry following reconstructive surgery 07/13/2019   Family history of breast cancer    Family history of prostate cancer    Unintentional weight loss 05/21/2019   Lymphedema of right arm 02/27/2018   Dyspnea 09/23/2016   Eczema 08/31/2016   Essential hypertension 11/05/2015   Cervical dystonia 08/25/2015   Headache 07/09/2015   Myofascial muscle pain 04/29/2015   Adhesive capsulitis of right shoulder 04/29/2015   Lower extremity edema 10/01/2014    Malignant neoplasm of overlapping sites of right breast in female, estrogen receptor positive (HCC) 05/24/2014   Myalgia and myositis 05/17/2014   Neoplasm related pain 03/12/2014   Constipation 11/26/2013   H/O reduction mammoplasty 10/30/2013   Seizure (HCC) 10/06/2013   Status post bilateral breast implants 08/31/2013   S/P breast reconstruction, right 08/23/2013   AC joint arthropathy 07/02/2013   Routine adult health maintenance 05/29/2013   Hypokalemia 05/21/2013   Chronic pain 04/19/2013   Acquired absence of breast and absent nipple 01/30/2013   RHINOSINUSITIS, CHRONIC 04/08/2010   Morbid obesity (HCC) 08/04/2006   DEPRESSIVE DISORDER, NOS 08/04/2006    PCP: Daleen Dubs, MD  REFERRING PROVIDER: Candee Cha, MD   REFERRING DIAG: I89.0 (ICD-10-CM) - Lymphedema of right arm   THERAPY DIAG:  Postmastectomy lymphedema  Stiffness of right shoulder, not elsewhere classified  History of right breast cancer  ONSET DATE: 2011  Rationale for Evaluation and Treatment Rehabilitation  SUBJECTIVE                                                                                                                                                                                           SUBJECTIVE STATEMENT: The WearEase compression bra feels really good. The foam you added to my hand made a big difference, I liked that a lot. I want my upper arm to do better, it feels like the fluid is being stubborn there.    PERTINENT HISTORY:  Right breast cancer with mastectomy 2011 and ALND 22 nodes; reconstruction; left breast reduction; h/o right UE lymphedema and right upper quadrant pain, treated in this clinic several times before.  PAIN:  Are you having pain? Yes NPRS scale: 4/10 Pain location: R axilla and RUE Pain orientation: Right  PAIN TYPE: aching, heaviness Pain description: intermittent  Aggravating factors: using the arm, and it just hurts often Relieving  factors: pain medication  PRECAUTIONS: Other: R UE lymphedema, has metal in ankle  RED FLAGS: none  WEIGHT BEARING RESTRICTIONS No  FALLS:  Has patient fallen in last 6 months? No  LIVING ENVIRONMENT: Lives with: lives with 3 sons Lives in: House/apartment Stairs: No;  Has following equipment at home: None  OCCUPATION: on disability  LEISURE: trying to exercise by walking but has been bothered by her L ankle pain  HAND DOMINANCE : right   PRIOR LEVEL OF FUNCTION: Independent  PATIENT GOALS to get my arm down and try to maintain it   OBJECTIVE  COGNITION:  Overall cognitive status: Within functional limits for tasks assessed    OBSERVATIONS / OTHER ASSESSMENTS: R upper arm considerable larger than left, area of swelling at posterior trunk just above band on bra  POSTURE: forward head, rounded shoulders  UPPER EXTREMITY AROM/PROM:  A/PROM RIGHT   eval   Shoulder extension 55  Shoulder flexion 133  Shoulder abduction 85  Shoulder internal rotation 40  Shoulder external rotation 60    (Blank rows = not tested)  A/PROM LEFT   eval  Shoulder extension 63  Shoulder flexion 175  Shoulder abduction 171  Shoulder internal rotation 64  Shoulder external rotation 95    (Blank rows = not tested)   LYMPHEDEMA ASSESSMENTS:   SURGERY TYPE/DATE: R mastectomy and ALND in 2011  NUMBER OF LYMPH NODES REMOVED: 1/20  CHEMOTHERAPY: completed  RADIATION:completed Jan 2012  HORMONE TREATMENT: anastrozole  from Nov 2016 to Dec 2019  INFECTIONS: none  LYMPHEDEMA ASSESSMENTS:   LANDMARK RIGHT  Eval on 03/16/22 10/25/23 11/11/23  10 cm proximal to olecranon process 47.1 50.7 47.2  Olecranon process 32.6 34.5 34  10 cm proximal to ulnar styloid process 23.6 26 26.4  Just proximal to ulnar styloid process 17.7 18 17.7  Across hand at thumb web space 21.4 22 20.2  At base of 2nd digit 6.1 6.2 5.8  (Blank rows = not tested)  LANDMARK LEFT  Eval on 03/16/22 10/25/23   10 cm proximal to olecranon process 43 46  Olecranon process 30 32.4  10 cm proximal to ulnar styloid process 24.4 26.1  Just proximal to ulnar styloid process 17.3 18.6  Across hand at thumb web space 20.6 22.1  At base of 2nd digit 6.4 6.4  (Blank rows = not tested)   Flowsheet Row Outpatient Rehab from 10/25/2023 in [facility]  Lymphedema Life Impact Scale Total Score 72.06 %         TODAY'S TREATMENT  11/11/23: Manual Therapy Cut 1/4" gray foam for UE x 2 pieces: 1 to wrap forearm and other to wrap upper arm. In supine: Short neck, 5 diaphragmatic breaths , Lt axillary and pectoral nodes and establishment of anterior inter-axillary pathway, Rt inguinal nodes and establishment of Rt axillo-inguinal pathway, then R UE working proximal to distal, moving inner upper arm outwards and upwards, and doing both sides of forearms, then retracing all steps Compression Bandaging to Rt UE: Cocoa butter, TG soft, artiflex to antecubital fossa, fixated new 1/4" foam to forearm and then upper arm, Molelast to fingers 1-4, artiflex to dorsal hand/wrist, then short stretch compression bandages as follows: 1 - 6 cm to hand/wrist with 1/2" gray foam to dorsal hand, then 1- 8 cm spiral from wrist with "X" at antecubital fossa, then 1 - 10 cm herring bone from wrist to mid upper arm, and then 1 - 12 cm spiral from wrist to axilla. Folded TG soft over top of bandage.    11/09/23 Manual Therapy In supine: Short neck, 5 diaphragmatic breaths , Lt axillary and pectoral nodes and establishment of anterior inter-axillary pathway, Rt inguinal nodes and  establishment of Rt axillo-inguinal pathway, then R UE working proximal to distal, moving inner upper arm outwards and upwards, and doing both sides of forearms, then retracing all steps P/ROM to Rt shoulder in supine during MLD into flex, abd, and D2  STM to Rt axilla where multiple areas of scar tissue and tightness palpable.   Compression bandaging to RUE: lotion, thick stockinette from hand to axilla, artiflex to antecubital fossa, rosidal soft from wrist to axilla, Molelast to fingers 1-4, artiflex to hand, then 1/2" gray foam to dorsal hand with 1 6 cm at hand/wrist, 1 8 cm spiral with "X" at elbow, 1 10 herringbone from wrist to upper arm, 1 12 cm spiral from wrist to axilla.    11/07/23: Manual Therapy In supine: Short neck, superficial abdominals and 5 diaphragmatic breaths (avoided deep abdominals due to pt had just had lunch), Lt axillary and pectoral nodes and establishment of anterior inter-axillary pathway, Rt inguinal nodes and establishment of Rt axillo-inguinal pathway, then R UE working proximal to distal, moving inner upper arm outwards and upwards, and doing both sides of forearms, then retracing all steps P/ROM to Rt shoulder in supine during MLD into flex, abd, and D2 with scapular depression by therapist throughout STM to Rt lateral trunk and axilla where multiple areas of scar tissue and tightness palpable.  Compression bandaging to RUE: Cocoa butter, thick stockinette from hand to axilla, artiflex to antecubital fossa, rosidal soft from wrist to axilla, Molelast to fingers 1-4, artiflex to hand, then 1/2" gray foam to dorsal hand with 1 6 cm at hand/wrist, 1 8 cm spiral with "X" at elbow, 1 10 herringbone from wrist to upper arm, 1 12 cm spiral from wrist to axilla.     PATIENT EDUCATION:  Education details: new night time garments available, bring pictures of old compression pumps, need for flat knit daytime garment, need for compression bra [place] educated: Patient Education method: Explanation Education comprehension: verbalized understanding   HOME EXERCISE PROGRAM: None yet  ASSESSMENT:  CLINICAL IMPRESSION: Pts circumference has reduced some since start of care, good reduction noted at upper arm. Switched to gray foam today to see if this will help further reduced upper arm where pt  reports most frustration with swelling. She reports bandage comfortable upon leaving today and knows to remove Sunday and she can wash them then as well. Pt is wearing her new WearEase compression bra and reports this very comfortable.    OBJECTIVE IMPAIRMENTS decreased ROM and increased edema.   ACTIVITY LIMITATIONS carrying, lifting, and reach over head  PARTICIPATION LIMITATIONS: none  PERSONAL FACTORS Time since onset of injury/illness/exacerbation are also affecting patient's functional outcome.   REHAB POTENTIAL: Good  CLINICAL DECISION MAKING: Stable/uncomplicated  EVALUATION COMPLEXITY: Low  GOALS: Goals reviewed with patient? Yes  SHORT TERM GOALS: Target date: 11/15/23   Pt will demonstrate a 2 cm reduction in edema at 10 cm proximal to olecranon process.  Baseline:  Goal status: INITIAL  2.  Pt will demonstrate 110 degrees of R shoulder abduction to allow pt to reach out to the side.  Baseline: 151 Goal status: INITIAL  3.  Pt will obtain a compression bra for long term management of truncal edema especially posterior..  Baseline:  Goal status: INITIAL  4.  Pt will demonstrate 150 degrees of R shoulder flexion to allow her to reach overhead.  Baseline:  Goal status: INITIAL   LONG TERM GOALS: Target date: 12/06/23    Pt will demonstrate a 3 cm  reduction in edema at 10 cm proximal to olecranon process.  Baseline:  Goal status: INITIAL  2.  Pt will demonstrate 165 degrees of R shoulder abduction to allow pt to reach out to the side.  Baseline:  Goal status: INITIAL  3.  Pt will obtain appropriate day time and night time garments for long term management of R UE lymphedema.  Baseline:  Goal status: INITIAL  4.  Pt will be able to independently manage her lymphedema through self MLD and compression garments.  Baseline:  Goal status: INITIAL  5.  Pt will demonstrate 165 degrees of R shoulder flexion to allow her to reach overhead.  Baseline:  Goal status:  INITIAL   PLAN: PT FREQUENCY: 3x/week  PT DURATION: 6 weeks  PLANNED INTERVENTIONS: Therapeutic exercises, Therapeutic activity, Patient/Family education, Self Care, Joint mobilization, Orthotic/Fit training, Manual lymph drainage, Compression bandaging, Vasopneumatic device, Manual therapy, and Re-evaluation  PLAN FOR NEXT SESSION: How was new foam? Cont CDT for Rt UE,  eventually measure for garments, pt interested in circaid profile night time garment.   Denyce Flank, PTA 11/11/2023, 9:22 AM

## 2023-11-14 ENCOUNTER — Ambulatory Visit

## 2023-11-14 DIAGNOSIS — Z853 Personal history of malignant neoplasm of breast: Secondary | ICD-10-CM | POA: Diagnosis not present

## 2023-11-14 DIAGNOSIS — M25611 Stiffness of right shoulder, not elsewhere classified: Secondary | ICD-10-CM | POA: Diagnosis not present

## 2023-11-14 DIAGNOSIS — I972 Postmastectomy lymphedema syndrome: Secondary | ICD-10-CM

## 2023-11-14 NOTE — Therapy (Signed)
 OUTPATIENT PHYSICAL THERAPY ONCOLOGY TREATMENT  Patient Name: Karen Hopkins MRN: [id number] DOB:04-22-1976, 48 y.o., female Today's Date: 11/14/2023   PT End of Session - 11/14/23 1206     Visit Number 6    Number of Visits 19    Date for PT Re-Evaluation 12/06/23    PT Start Time 1204    PT Stop Time 1258    PT Time Calculation (min) 54 min    Activity Tolerance Patient tolerated treatment well    Behavior During Therapy [facility] for tasks assessed/performed               Past Medical History:  Diagnosis Date   Anxiety    Arthritis    Cancer (HCC) 2011   Rt. Br. Ca   Depression    Diabetes mellitus without complication (HCC)    has been "borderline"   Essential hypertension 11/05/2015   H. pylori infection    History of radiation therapy    Hypertension    under control with med., has been on med. x 2 yr.   Lymphedema of arm    right; no BP or puncture to right arm   Personal history of chemotherapy 09/16/2009   rt breast   Pneumonia    Seasonal allergies    Sinus headache    Past Surgical History:  Procedure Laterality Date   BREAST BIOPSY Right 08/26/2009   BREAST REDUCTION SURGERY Left 08/23/2013   Procedure: LEFT BREAST REDUCTION  ;  Surgeon: Marilou Showman, DO;  Location: [facility];  Service: Plastics;  Laterality: Left;   BREAST REDUCTION WITH MASTOPEXY Left 08/22/2019   Procedure: Left breast mastopexy reduction with liposuction for breast symmetry;  Surgeon: Thornell Flirt, DO;  Location: [facility];  Service: Plastics;  Laterality: Left;   CESAREAN SECTION  07/15/2001; 03/18/2004; 11/21/2008   lateral orbiotomy Left 11/2015   Dr. Abel Abelson at [facility]   LATISSIMUS FLAP TO BREAST Right 03/12/2013   Procedure: RIGHT BREAST LATISSIMUS FLAP WITH EXPANDER PLACEMENT;  Surgeon: Marilou Showman, DO;  Location: [facility];  Service: Plastics;  Laterality: Right;   LIPOSUCTION Bilateral 08/23/2013   Procedure: LIPOSUCTION;  Surgeon: Marilou Showman, DO;  Location: [facility];  Service: Plastics;  Laterality: Bilateral;   LIPOSUCTION WITH LIPOFILLING Left 08/22/2019   Procedure: LIPOSUCTION WITH LIPOFILLING;  Surgeon: Thornell Flirt, DO;  Location: [facility];  Service: Plastics;  Laterality: Left;   MASTECTOMY Right 2011   MODIFIED RADICAL MASTECTOMY Right 03/11/2010   NASAL SEPTUM SURGERY     ORIF ANKLE FRACTURE Left 07/18/2018   Procedure: OPEN REDUCTION INTERNAL FIXATION (ORIF) LEFT ANKLE FRACTURE WITH  SYNDESMOSIS AND ANKLE ARTHROTOMY;  Surgeon: Donnamarie Gables, MD;  Location: [facility];  Service: Orthopedics;  Laterality: Left;   PORT-A-CATH REMOVAL Left 12/01/2010   PORTACATH PLACEMENT Left 09/12/2009   REDUCTION MAMMAPLASTY Left 08/2013   REMOVAL OF TISSUE EXPANDER AND PLACEMENT OF IMPLANT Right 08/23/2013   Procedure: REMOVAL RIGHT TISSUE EXPANDER AND PLACEMENT OF IMPLANT TO RIGHT BREAST ;  Surgeon: Marilou Showman, DO;  Location: [facility];  Service: Plastics;  Laterality: Right;   SUPRA-UMBILICAL HERNIA  2006   TUBAL LIGATION  11/21/2008   UMBILICAL HERNIA REPAIR  04/12/2022   CCS DR   Patient Active Problem List   Diagnosis Date Noted   Contraceptive, surveillance, intrauterine device 04/13/2023   Nephrolithiasis 10/21/2022   H. pylori infection 09/25/2022   Abdominal hernia 09/25/2022  Seasonal allergies 09/25/2022   Carpal tunnel syndrome 10/07/2021   Genetic testing 08/14/2019   Breast asymmetry following reconstructive surgery 07/13/2019   Family history of breast cancer    Family history of prostate cancer    Unintentional weight loss 05/21/2019   Lymphedema of right arm 02/27/2018   Dyspnea 09/23/2016   Eczema 08/31/2016   Essential hypertension 11/05/2015   Cervical dystonia 08/25/2015   Headache 07/09/2015   Myofascial muscle pain 04/29/2015   Adhesive capsulitis of right shoulder 04/29/2015   Lower extremity edema 10/01/2014    Malignant neoplasm of overlapping sites of right breast in female, estrogen receptor positive (HCC) 05/24/2014   Myalgia and myositis 05/17/2014   Neoplasm related pain 03/12/2014   Constipation 11/26/2013   H/O reduction mammoplasty 10/30/2013   Seizure (HCC) 10/06/2013   Status post bilateral breast implants 08/31/2013   S/P breast reconstruction, right 08/23/2013   AC joint arthropathy 07/02/2013   Routine adult health maintenance 05/29/2013   Hypokalemia 05/21/2013   Chronic pain 04/19/2013   Acquired absence of breast and absent nipple 01/30/2013   RHINOSINUSITIS, CHRONIC 04/08/2010   Morbid obesity (HCC) 08/04/2006   DEPRESSIVE DISORDER, NOS 08/04/2006    PCP: Daleen Dubs, MD  REFERRING PROVIDER: Candee Cha, MD   REFERRING DIAG: I89.0 (ICD-10-CM) - Lymphedema of right arm   THERAPY DIAG:  Postmastectomy lymphedema  Stiffness of right shoulder, not elsewhere classified  History of right breast cancer  ONSET DATE: 2011  Rationale for Evaluation and Treatment Rehabilitation  SUBJECTIVE                                                                                                                                                                                           SUBJECTIVE STATEMENT: The new foam worked great. My elbow didn't pop out of my bandage.    PERTINENT HISTORY:  Right breast cancer with mastectomy 2011 and ALND 22 nodes; reconstruction; left breast reduction; h/o right UE lymphedema and right upper quadrant pain, treated in this clinic several times before.  PAIN:  Are you having pain? Yes NPRS scale: 4/10 Pain location: R axilla and RUE Pain orientation: Right  PAIN TYPE: aching, heaviness Pain description: intermittent  Aggravating factors: using the arm, and it just hurts often Relieving factors: pain medication  PRECAUTIONS: Other: R UE lymphedema, has metal in ankle  RED FLAGS: none  WEIGHT BEARING RESTRICTIONS  No  FALLS:  Has patient fallen in last 6 months? No  LIVING ENVIRONMENT: Lives with: lives with 3 sons Lives in: House/apartment Stairs: No;  Has following equipment at home: None  OCCUPATION: on disability  LEISURE: trying  to exercise by walking but has been bothered by her L ankle pain  HAND DOMINANCE : right   PRIOR LEVEL OF FUNCTION: Independent  PATIENT GOALS to get my arm down and try to maintain it   OBJECTIVE  COGNITION:  Overall cognitive status: Within functional limits for tasks assessed    OBSERVATIONS / OTHER ASSESSMENTS: R upper arm considerable larger than left, area of swelling at posterior trunk just above band on bra  POSTURE: forward head, rounded shoulders  UPPER EXTREMITY AROM/PROM:  A/PROM RIGHT   eval   Shoulder extension 55  Shoulder flexion 133  Shoulder abduction 85  Shoulder internal rotation 40  Shoulder external rotation 60    (Blank rows = not tested)  A/PROM LEFT   eval  Shoulder extension 63  Shoulder flexion 175  Shoulder abduction 171  Shoulder internal rotation 64  Shoulder external rotation 95    (Blank rows = not tested)   LYMPHEDEMA ASSESSMENTS:   SURGERY TYPE/DATE: R mastectomy and ALND in 2011  NUMBER OF LYMPH NODES REMOVED: 1/20  CHEMOTHERAPY: completed  RADIATION:completed Jan 2012  HORMONE TREATMENT: anastrozole  from Nov 2016 to Dec 2019  INFECTIONS: none  LYMPHEDEMA ASSESSMENTS:   LANDMARK RIGHT  Eval on 03/16/22 10/25/23 11/11/23  10 cm proximal to olecranon process 47.1 50.7 47.2  Olecranon process 32.6 34.5 34  10 cm proximal to ulnar styloid process 23.6 26 26.4  Just proximal to ulnar styloid process 17.7 18 17.7  Across hand at thumb web space 21.4 22 20.2  At base of 2nd digit 6.1 6.2 5.8  (Blank rows = not tested)  LANDMARK LEFT  Eval on 03/16/22 10/25/23  10 cm proximal to olecranon process 43 46  Olecranon process 30 32.4  10 cm proximal to ulnar styloid process 24.4 26.1  Just  proximal to ulnar styloid process 17.3 18.6  Across hand at thumb web space 20.6 22.1  At base of 2nd digit 6.4 6.4  (Blank rows = not tested)   Flowsheet Row Outpatient Rehab from 10/25/2023 in [facility]  Lymphedema Life Impact Scale Total Score 72.06 %         TODAY'S TREATMENT  11/14/23: Manual Therapy In supine: Short neck, 5 diaphragmatic breaths , Lt axillary and pectoral nodes and establishment of anterior inter-axillary pathway, Rt inguinal nodes and establishment of Rt axillo-inguinal pathway, then R UE working proximal to distal, moving inner upper arm outwards and upwards, and doing both sides of forearms, then retracing all steps Compression Bandaging to Rt UE: Cocoa butter, TG soft, artiflex to antecubital fossa, fixated new 1/4" foam to forearm and then upper arm, Molelast to fingers 1-4, artiflex to dorsal hand/wrist, then short stretch compression bandages as follows: 1 - 6 cm to hand/wrist with 1/2" gray foam to dorsal hand, then 1- 8 cm spiral from wrist with "X" at antecubital fossa, then 1 - 10 cm herring bone from wrist to mid upper arm, and then 1 - 12 cm spiral from wrist to axilla. Folded TG soft over top of bandage.   11/11/23: Manual Therapy Cut 1/4" gray foam for UE x 2 pieces: 1 to wrap forearm and other to wrap upper arm. In supine: Short neck, 5 diaphragmatic breaths , Lt axillary and pectoral nodes and establishment of anterior inter-axillary pathway, Rt inguinal nodes and establishment of Rt axillo-inguinal pathway, then R UE working proximal to distal, moving inner upper arm outwards and upwards, and doing both sides of forearms, then retracing all  steps Compression Bandaging to Rt UE: Cocoa butter, TG soft, artiflex to antecubital fossa, fixated new 1/4" foam to forearm and then upper arm, Molelast to fingers 1-4, artiflex to dorsal hand/wrist, then short stretch compression bandages as follows: 1 - 6 cm to hand/wrist with 1/2" gray  foam to dorsal hand, then 1- 8 cm spiral from wrist with "X" at antecubital fossa, then 1 - 10 cm herring bone from wrist to mid upper arm, and then 1 - 12 cm spiral from wrist to axilla. Folded TG soft over top of bandage.    11/09/23 Manual Therapy In supine: Short neck, 5 diaphragmatic breaths , Lt axillary and pectoral nodes and establishment of anterior inter-axillary pathway, Rt inguinal nodes and establishment of Rt axillo-inguinal pathway, then R UE working proximal to distal, moving inner upper arm outwards and upwards, and doing both sides of forearms, then retracing all steps P/ROM to Rt shoulder in supine during MLD into flex, abd, and D2  STM to Rt axilla where multiple areas of scar tissue and tightness palpable.  Compression bandaging to RUE: lotion, thick stockinette from hand to axilla, artiflex to antecubital fossa, rosidal soft from wrist to axilla, Molelast to fingers 1-4, artiflex to hand, then 1/2" gray foam to dorsal hand with 1 6 cm at hand/wrist, 1 8 cm spiral with "X" at elbow, 1 10 herringbone from wrist to upper arm, 1 12 cm spiral from wrist to axilla.       PATIENT EDUCATION:  Education details: new night time garments available, bring pictures of old compression pumps, need for flat knit daytime garment, need for compression bra [place] educated: Patient Education method: Explanation Education comprehension: verbalized understanding   HOME EXERCISE PROGRAM: None yet  ASSESSMENT:  CLINICAL IMPRESSION: Pt was able to leave bandage on until yesterday. Today continued with Rt UE CDT for lymphedema with new gray foam as pt reports this helped her bandage stayed constructed longer.    OBJECTIVE IMPAIRMENTS decreased ROM and increased edema.   ACTIVITY LIMITATIONS carrying, lifting, and reach over head  PARTICIPATION LIMITATIONS: none  PERSONAL FACTORS Time since onset of injury/illness/exacerbation are also affecting patient's functional outcome.   REHAB  POTENTIAL: Good  CLINICAL DECISION MAKING: Stable/uncomplicated  EVALUATION COMPLEXITY: Low  GOALS: Goals reviewed with patient? Yes  SHORT TERM GOALS: Target date: 11/15/23   Pt will demonstrate a 2 cm reduction in edema at 10 cm proximal to olecranon process.  Baseline:  Goal status: INITIAL  2.  Pt will demonstrate 110 degrees of R shoulder abduction to allow pt to reach out to the side.  Baseline: 151 Goal status: INITIAL  3.  Pt will obtain a compression bra for long term management of truncal edema especially posterior..  Baseline:  Goal status: INITIAL  4.  Pt will demonstrate 150 degrees of R shoulder flexion to allow her to reach overhead.  Baseline:  Goal status: INITIAL   LONG TERM GOALS: Target date: 12/06/23    Pt will demonstrate a 3 cm reduction in edema at 10 cm proximal to olecranon process.  Baseline:  Goal status: INITIAL  2.  Pt will demonstrate 165 degrees of R shoulder abduction to allow pt to reach out to the side.  Baseline:  Goal status: INITIAL  3.  Pt will obtain appropriate day time and night time garments for long term management of R UE lymphedema.  Baseline:  Goal status: INITIAL  4.  Pt will be able to independently manage her lymphedema through self MLD  and compression garments.  Baseline:  Goal status: INITIAL  5.  Pt will demonstrate 165 degrees of R shoulder flexion to allow her to reach overhead.  Baseline:  Goal status: INITIAL   PLAN: PT FREQUENCY: 3x/week  PT DURATION: 6 weeks  PLANNED INTERVENTIONS: Therapeutic exercises, Therapeutic activity, Patient/Family education, Self Care, Joint mobilization, Orthotic/Fit training, Manual lymph drainage, Compression bandaging, Vasopneumatic device, Manual therapy, and Re-evaluation  PLAN FOR NEXT SESSION: Measure circumference next and decide if pt to be measured with Dorisann Garre next week. Cont CDT for Rt UE,  eventually measure for garments, pt interested in circaid profile night  time garment.   Denyce Flank, PTA 11/14/2023, 1:06 PM

## 2023-11-15 ENCOUNTER — Telehealth: Payer: Self-pay

## 2023-11-15 MED ORDER — SEMAGLUTIDE-WEIGHT MANAGEMENT 0.25 MG/0.5ML ~~LOC~~ SOAJ: 0.2500 mg | SUBCUTANEOUS | 0 refills | Status: DC

## 2023-11-15 NOTE — Addendum Note (Signed)
 Addended by: Candee Cha on: 11/15/2023 11:39 AM   Modules accepted: Orders

## 2023-11-15 NOTE — Telephone Encounter (Signed)
 [id number] rx sent  Candee Cha, MD

## 2023-11-15 NOTE — Telephone Encounter (Signed)
 Pharmacy Patient Advocate Encounter   Received notification from CoverMyMeds that prior authorization for [facility] 0.25MG  is required/requested.   Insurance verification completed.   The patient is insured through [facility] .   PA required; PA submitted to above mentioned insurance via CoverMyMeds Key/confirmation #/EOC [id number]. Status is pending

## 2023-11-16 ENCOUNTER — Ambulatory Visit

## 2023-11-16 DIAGNOSIS — Z853 Personal history of malignant neoplasm of breast: Secondary | ICD-10-CM | POA: Diagnosis not present

## 2023-11-16 DIAGNOSIS — I972 Postmastectomy lymphedema syndrome: Secondary | ICD-10-CM

## 2023-11-16 DIAGNOSIS — M25611 Stiffness of right shoulder, not elsewhere classified: Secondary | ICD-10-CM

## 2023-11-16 NOTE — Telephone Encounter (Signed)
 Pharmacy Patient Advocate Encounter  Received notification from [employer] that Prior Authorization for [facility] has been DENIED.  Full denial letter will be uploaded to the media tab. See denial reason below.  Your requested medication is an anti-obesity medication that is excluded from Part D prescription coverage under Medicare rules, unless it is being used for a medically-accepted diagnosis approved by the Food and Drug Administration.  *Please note: Frederik Jansky may be covered when used for a medically-accepted indication. The Food and Drug Administration has approved [phone] for the treatment to reduce the risk of major adverse cardiovascular events (cardiovascular death, non-fatal myocardial infarction, or non-fatal stroke). Any request outside this indication is an exclusion.   PA #/Case ID/Reference #:  [id number]

## 2023-11-16 NOTE — Therapy (Signed)
 OUTPATIENT PHYSICAL THERAPY ONCOLOGY TREATMENT  Patient Name: Karen Hopkins MRN: [id number] DOB:March 14, 1976, 48 y.o., female Today's Date: 11/16/2023   PT End of Session - 11/16/23 1408     Visit Number 7    Number of Visits 19    Date for PT Re-Evaluation 12/06/23    PT Start Time 1404    PT Stop Time 1457    PT Time Calculation (min) 53 min    Activity Tolerance Patient tolerated treatment well    Behavior During Therapy [facility] for tasks assessed/performed               Past Medical History:  Diagnosis Date   Anxiety    Arthritis    Cancer (HCC) 2011   Rt. Br. Ca   Depression    Diabetes mellitus without complication (HCC)    has been borderline   Essential hypertension 11/05/2015   H. pylori infection    History of radiation therapy    Hypertension    under control with med., has been on med. x 2 yr.   Lymphedema of arm    right; no BP or puncture to right arm   Personal history of chemotherapy 09/16/2009   rt breast   Pneumonia    Seasonal allergies    Sinus headache    Past Surgical History:  Procedure Laterality Date   BREAST BIOPSY Right 08/26/2009   BREAST REDUCTION SURGERY Left 08/23/2013   Procedure: LEFT BREAST REDUCTION  ;  Surgeon: Marilou Showman, DO;  Location: [facility];  Service: Plastics;  Laterality: Left;   BREAST REDUCTION WITH MASTOPEXY Left 08/22/2019   Procedure: Left breast mastopexy reduction with liposuction for breast symmetry;  Surgeon: Thornell Flirt, DO;  Location: [facility];  Service: Plastics;  Laterality: Left;   CESAREAN SECTION  07/15/2001; 03/18/2004; 11/21/2008   lateral orbiotomy Left 11/2015   Dr. Abel Abelson at [facility]   LATISSIMUS FLAP TO BREAST Right 03/12/2013   Procedure: RIGHT BREAST LATISSIMUS FLAP WITH EXPANDER PLACEMENT;  Surgeon: Marilou Showman, DO;  Location: [facility];  Service: Plastics;  Laterality: Right;   LIPOSUCTION Bilateral 08/23/2013   Procedure: LIPOSUCTION;  Surgeon: Marilou Showman, DO;  Location: [facility];  Service: Plastics;  Laterality: Bilateral;   LIPOSUCTION WITH LIPOFILLING Left 08/22/2019   Procedure: LIPOSUCTION WITH LIPOFILLING;  Surgeon: Thornell Flirt, DO;  Location: [facility];  Service: Plastics;  Laterality: Left;   MASTECTOMY Right 2011   MODIFIED RADICAL MASTECTOMY Right 03/11/2010   NASAL SEPTUM SURGERY     ORIF ANKLE FRACTURE Left 07/18/2018   Procedure: OPEN REDUCTION INTERNAL FIXATION (ORIF) LEFT ANKLE FRACTURE WITH  SYNDESMOSIS AND ANKLE ARTHROTOMY;  Surgeon: Donnamarie Gables, MD;  Location: [facility];  Service: Orthopedics;  Laterality: Left;   PORT-A-CATH REMOVAL Left 12/01/2010   PORTACATH PLACEMENT Left 09/12/2009   REDUCTION MAMMAPLASTY Left 08/2013   REMOVAL OF TISSUE EXPANDER AND PLACEMENT OF IMPLANT Right 08/23/2013   Procedure: REMOVAL RIGHT TISSUE EXPANDER AND PLACEMENT OF IMPLANT TO RIGHT BREAST ;  Surgeon: Marilou Showman, DO;  Location: [facility];  Service: Plastics;  Laterality: Right;   SUPRA-UMBILICAL HERNIA  2006   TUBAL LIGATION  11/21/2008   UMBILICAL HERNIA REPAIR  04/12/2022   [facility]S DR   Patient Active Problem List   Diagnosis Date Noted   Contraceptive, surveillance, intrauterine device 04/13/2023   Nephrolithiasis 10/21/2022   H. pylori infection 09/25/2022   Abdominal hernia 09/25/2022  Seasonal allergies 09/25/2022   Carpal tunnel syndrome 10/07/2021   Genetic testing 08/14/2019   Breast asymmetry following reconstructive surgery 07/13/2019   Family history of breast cancer    Family history of prostate cancer    Unintentional weight loss 05/21/2019   Lymphedema of right arm 02/27/2018   Dyspnea 09/23/2016   Eczema 08/31/2016   Essential hypertension 11/05/2015   Cervical dystonia 08/25/2015   Headache 07/09/2015   Myofascial muscle pain 04/29/2015   Adhesive capsulitis of right shoulder 04/29/2015   Lower extremity edema 10/01/2014    Malignant neoplasm of overlapping sites of right breast in female, estrogen receptor positive (HCC) 05/24/2014   Myalgia and myositis 05/17/2014   Neoplasm related pain 03/12/2014   Constipation 11/26/2013   H/O reduction mammoplasty 10/30/2013   Seizure (HCC) 10/06/2013   Status post bilateral breast implants 08/31/2013   S/P breast reconstruction, right 08/23/2013   AC joint arthropathy 07/02/2013   Routine adult health maintenance 05/29/2013   Hypokalemia 05/21/2013   Chronic pain 04/19/2013   Acquired absence of breast and absent nipple 01/30/2013   RHINOSINUSITIS, CHRONIC 04/08/2010   Morbid obesity (HCC) 08/04/2006   DEPRESSIVE DISORDER, NOS 08/04/2006    PCP: Daleen Dubs, MD  REFERRING PROVIDER: Candee Cha, MD   REFERRING DIAG: I89.0 (ICD-10-CM) - Lymphedema of right arm   THERAPY DIAG:  Postmastectomy lymphedema  Stiffness of right shoulder, not elsewhere classified  History of right breast cancer  ONSET DATE: 2011  Rationale for Evaluation and Treatment Rehabilitation  SUBJECTIVE                                                                                                                                                                                           SUBJECTIVE STATEMENT: I took my bandages off yesterday to wash them    PERTINENT HISTORY:  Right breast cancer with mastectomy 2011 and ALND 22 nodes; reconstruction; left breast reduction; h/o right UE lymphedema and right upper quadrant pain, treated in this clinic several times before.  PAIN:  Are you having pain? Yes NPRS scale: 4/10 Pain location: R axilla and RUE Pain orientation: Right  PAIN TYPE: aching, heaviness Pain description: intermittent  Aggravating factors: using the arm, and it just hurts often Relieving factors: pain medication  PRECAUTIONS: Other: R UE lymphedema, has metal in ankle  RED FLAGS: none  WEIGHT BEARING RESTRICTIONS No  FALLS:  Has patient  fallen in last 6 months? No  LIVING ENVIRONMENT: Lives with: lives with 3 sons Lives in: House/apartment Stairs: No;  Has following equipment at home: None  OCCUPATION: on disability  LEISURE: trying to exercise by walking  but has been bothered by her L ankle pain  HAND DOMINANCE : right   PRIOR LEVEL OF FUNCTION: Independent  PATIENT GOALS to get my arm down and try to maintain it   OBJECTIVE  COGNITION:  Overall cognitive status: Within functional limits for tasks assessed    OBSERVATIONS / OTHER ASSESSMENTS: R upper arm considerable larger than left, area of swelling at posterior trunk just above band on bra  POSTURE: forward head, rounded shoulders  UPPER EXTREMITY AROM/PROM:  A/PROM RIGHT   eval   Shoulder extension 55  Shoulder flexion 133  Shoulder abduction 85  Shoulder internal rotation 40  Shoulder external rotation 60    (Blank rows = not tested)  A/PROM LEFT   eval  Shoulder extension 63  Shoulder flexion 175  Shoulder abduction 171  Shoulder internal rotation 64  Shoulder external rotation 95    (Blank rows = not tested)   LYMPHEDEMA ASSESSMENTS:   SURGERY TYPE/DATE: R mastectomy and ALND in 2011  NUMBER OF LYMPH NODES REMOVED: 1/20  CHEMOTHERAPY: completed  RADIATION:completed Jan 2012  HORMONE TREATMENT: anastrozole  from Nov 2016 to Dec 2019  INFECTIONS: none  LYMPHEDEMA ASSESSMENTS:   LANDMARK RIGHT  Eval on 03/16/22 10/25/23 11/11/23 11/16/23  10 cm proximal to olecranon process 47.1 50.7 47.2 47.4  Olecranon process 32.6 34.5 34 34.5  10 cm proximal to ulnar styloid process 23.6 26 26.4 26.5  Just proximal to ulnar styloid process 17.7 18 17.7 17.3  Across hand at thumb web space 21.4 22 20.2 20.2  At base of 2nd digit 6.1 6.2 5.8 6.1  (Blank rows = not tested)  LANDMARK LEFT  Eval on 03/16/22 10/25/23  10 cm proximal to olecranon process 43 46  Olecranon process 30 32.4  10 cm proximal to ulnar styloid process 24.4 26.1   Just proximal to ulnar styloid process 17.3 18.6  Across hand at thumb web space 20.6 22.1  At base of 2nd digit 6.4 6.4  (Blank rows = not tested)   Flowsheet Row Outpatient Rehab from 10/25/2023 in [facility]  Lymphedema Life Impact Scale Total Score 72.06 %         TODAY'S TREATMENT  11/16/23: Manual Therapy In supine: Short neck, superficial and deep abdominals , Lt axillary and pectoral nodes and establishment of anterior inter-axillary pathway, Rt inguinal nodes and establishment of Rt axillo-inguinal pathway, then R UE working proximal to distal, moving inner upper arm outwards and upwards, and doing both sides of forearms, then retracing all steps P/ROM to Rt shoulder into flex, abd and D2 to pts tolerance.  Compression Bandaging to Rt UE: Cocoa butter, TG soft, artiflex to antecubital fossa, fixated new 1/4 foam to forearm and then upper arm, Molelast to fingers 1-4, artiflex to dorsal hand/wrist, then short stretch compression bandages as follows: 1 - 6 cm to hand/wrist with 1/2 gray foam to dorsal hand, then 1- 8 cm spiral from wrist with X at antecubital fossa, then 1 - 10 cm herring bone from wrist to mid upper arm, and then 1 - 12 cm spiral from wrist to axilla. Folded TG soft over top of bandage.   11/14/23: Manual Therapy In supine: Short neck, 5 diaphragmatic breaths , Lt axillary and pectoral nodes and establishment of anterior inter-axillary pathway, Rt inguinal nodes and establishment of Rt axillo-inguinal pathway, then R UE working proximal to distal, moving inner upper arm outwards and upwards, and doing both sides of forearms, then retracing all steps Compression  Bandaging to Rt UE: Cocoa butter, TG soft, artiflex to antecubital fossa, fixated new 1/4 foam to forearm and then upper arm, Molelast to fingers 1-4, artiflex to dorsal hand/wrist, then short stretch compression bandages as follows: 1 - 6 cm to hand/wrist with 1/2 gray foam to  dorsal hand, then 1- 8 cm spiral from wrist with X at antecubital fossa, then 1 - 10 cm herring bone from wrist to mid upper arm, and then 1 - 12 cm spiral from wrist to axilla. Folded TG soft over top of bandage.   11/11/23: Manual Therapy Cut 1/4 gray foam for UE x 2 pieces: 1 to wrap forearm and other to wrap upper arm. In supine: Short neck, 5 diaphragmatic breaths , Lt axillary and pectoral nodes and establishment of anterior inter-axillary pathway, Rt inguinal nodes and establishment of Rt axillo-inguinal pathway, then R UE working proximal to distal, moving inner upper arm outwards and upwards, and doing both sides of forearms, then retracing all steps Compression Bandaging to Rt UE: Cocoa butter, TG soft, artiflex to antecubital fossa, fixated new 1/4 foam to forearm and then upper arm, Molelast to fingers 1-4, artiflex to dorsal hand/wrist, then short stretch compression bandages as follows: 1 - 6 cm to hand/wrist with 1/2 gray foam to dorsal hand, then 1- 8 cm spiral from wrist with X at antecubital fossa, then 1 - 10 cm herring bone from wrist to mid upper arm, and then 1 - 12 cm spiral from wrist to axilla. Folded TG soft over top of bandage.       PATIENT EDUCATION:  Education details: new night time garments available, bring pictures of old compression pumps, need for flat knit daytime garment, need for compression bra [employer] educated: Patient Education method: Explanation Education comprehension: verbalized understanding   HOME EXERCISE PROGRAM: None yet  ASSESSMENT:  CLINICAL IMPRESSION: Continued with CDT to Rt UE. Pts circumference measurements have plateaued so scheduled her to be measured next week on 6/18 after appt that day.    OBJECTIVE IMPAIRMENTS decreased ROM and increased edema.   ACTIVITY LIMITATIONS carrying, lifting, and reach over head  PARTICIPATION LIMITATIONS: none  PERSONAL FACTORS Time since onset of injury/illness/exacerbation are also  affecting patient's functional outcome.   REHAB POTENTIAL: Good  CLINICAL DECISION MAKING: Stable/uncomplicated  EVALUATION COMPLEXITY: Low  GOALS: Goals reviewed with patient? Yes  SHORT TERM GOALS: Target date: 11/15/23   Pt will demonstrate a 2 cm reduction in edema at 10 cm proximal to olecranon process.  Baseline:  Goal status: INITIAL  2.  Pt will demonstrate 110 degrees of R shoulder abduction to allow pt to reach out to the side.  Baseline: 151 Goal status: INITIAL  3.  Pt will obtain a compression bra for long term management of truncal edema especially posterior..  Baseline:  Goal status: INITIAL  4.  Pt will demonstrate 150 degrees of R shoulder flexion to allow her to reach overhead.  Baseline:  Goal status: INITIAL   LONG TERM GOALS: Target date: 12/06/23    Pt will demonstrate a 3 cm reduction in edema at 10 cm proximal to olecranon process.  Baseline:  Goal status: INITIAL  2.  Pt will demonstrate 165 degrees of R shoulder abduction to allow pt to reach out to the side.  Baseline:  Goal status: INITIAL  3.  Pt will obtain appropriate day time and night time garments for long term management of R UE lymphedema.  Baseline:  Goal status: INITIAL  4.  Pt  will be able to independently manage her lymphedema through self MLD and compression garments.  Baseline:  Goal status: INITIAL  5.  Pt will demonstrate 165 degrees of R shoulder flexion to allow her to reach overhead.  Baseline:  Goal status: INITIAL   PLAN: PT FREQUENCY: 3x/week  PT DURATION: 6 weeks  PLANNED INTERVENTIONS: Therapeutic exercises, Therapeutic activity, Patient/Family education, Self Care, Joint mobilization, Orthotic/Fit training, Manual lymph drainage, Compression bandaging, Vasopneumatic device, Manual therapy, and Re-evaluation  PLAN FOR NEXT SESSION: Pt to be measured by Dorisann Garre after appt on 6/18 at 3:00. Cont CDT for Rt UE, pt interested in circaid profile night time  garment.   Denyce Flank, PTA 11/16/2023, 3:01 PM

## 2023-11-18 ENCOUNTER — Ambulatory Visit

## 2023-11-18 DIAGNOSIS — M25611 Stiffness of right shoulder, not elsewhere classified: Secondary | ICD-10-CM

## 2023-11-18 DIAGNOSIS — Z853 Personal history of malignant neoplasm of breast: Secondary | ICD-10-CM

## 2023-11-18 DIAGNOSIS — I972 Postmastectomy lymphedema syndrome: Secondary | ICD-10-CM

## 2023-11-18 NOTE — Therapy (Signed)
 OUTPATIENT PHYSICAL THERAPY ONCOLOGY TREATMENT  Patient Name: Karen Hopkins MRN: [id number] DOB:March 07, 1976, 48 y.o., female Today's Date: 11/18/2023   PT End of Session - 11/18/23 0911     Visit Number 8    Number of Visits 19    Date for PT Re-Evaluation 12/06/23    PT Start Time 0904    PT Stop Time 0957    PT Time Calculation (min) 53 min    Activity Tolerance Patient tolerated treatment well    Behavior During Therapy [facility] for tasks assessed/performed            Past Medical History:  Diagnosis Date   Anxiety    Arthritis    Cancer (HCC) 2011   Rt. Br. Ca   Depression    Diabetes mellitus without complication (HCC)    has been borderline   Essential hypertension 11/05/2015   H. pylori infection    History of radiation therapy    Hypertension    under control with med., has been on med. x 2 yr.   Lymphedema of arm    right; no BP or puncture to right arm   Personal history of chemotherapy 09/16/2009   rt breast   Pneumonia    Seasonal allergies    Sinus headache    Past Surgical History:  Procedure Laterality Date   BREAST BIOPSY Right 08/26/2009   BREAST REDUCTION SURGERY Left 08/23/2013   Procedure: LEFT BREAST REDUCTION  ;  Surgeon: Marilou Showman, DO;  Location: [facility];  Service: Plastics;  Laterality: Left;   BREAST REDUCTION WITH MASTOPEXY Left 08/22/2019   Procedure: Left breast mastopexy reduction with liposuction for breast symmetry;  Surgeon: Thornell Flirt, DO;  Location: [facility];  Service: Plastics;  Laterality: Left;   CESAREAN SECTION  07/15/2001; 03/18/2004; 11/21/2008   lateral orbiotomy Left 11/2015   Dr. Abel Abelson at [facility]   LATISSIMUS FLAP TO BREAST Right 03/12/2013   Procedure: RIGHT BREAST LATISSIMUS FLAP WITH EXPANDER PLACEMENT;  Surgeon: Marilou Showman, DO;  Location: [facility];  Service: Plastics;  Laterality: Right;   LIPOSUCTION Bilateral 08/23/2013   Procedure: LIPOSUCTION;  Surgeon: Marilou Showman,  DO;  Location: [facility];  Service: Plastics;  Laterality: Bilateral;   LIPOSUCTION WITH LIPOFILLING Left 08/22/2019   Procedure: LIPOSUCTION WITH LIPOFILLING;  Surgeon: Thornell Flirt, DO;  Location: [facility];  Service: Plastics;  Laterality: Left;   MASTECTOMY Right 2011   MODIFIED RADICAL MASTECTOMY Right 03/11/2010   NASAL SEPTUM SURGERY     ORIF ANKLE FRACTURE Left 07/18/2018   Procedure: OPEN REDUCTION INTERNAL FIXATION (ORIF) LEFT ANKLE FRACTURE WITH  SYNDESMOSIS AND ANKLE ARTHROTOMY;  Surgeon: Donnamarie Gables, MD;  Location: [facility];  Service: Orthopedics;  Laterality: Left;   PORT-A-CATH REMOVAL Left 12/01/2010   PORTACATH PLACEMENT Left 09/12/2009   REDUCTION MAMMAPLASTY Left 08/2013   REMOVAL OF TISSUE EXPANDER AND PLACEMENT OF IMPLANT Right 08/23/2013   Procedure: REMOVAL RIGHT TISSUE EXPANDER AND PLACEMENT OF IMPLANT TO RIGHT BREAST ;  Surgeon: Marilou Showman, DO;  Location: [facility];  Service: Plastics;  Laterality: Right;   SUPRA-UMBILICAL HERNIA  2006   TUBAL LIGATION  11/21/2008   UMBILICAL HERNIA REPAIR  04/12/2022   [facility]S DR   Patient Active Problem List   Diagnosis Date Noted   Contraceptive, surveillance, intrauterine device 04/13/2023   Nephrolithiasis 10/21/2022   H. pylori infection 09/25/2022   Abdominal hernia 09/25/2022   Seasonal allergies  09/25/2022   Carpal tunnel syndrome 10/07/2021   Genetic testing 08/14/2019   Breast asymmetry following reconstructive surgery 07/13/2019   Family history of breast cancer    Family history of prostate cancer    Unintentional weight loss 05/21/2019   Lymphedema of right arm 02/27/2018   Dyspnea 09/23/2016   Eczema 08/31/2016   Essential hypertension 11/05/2015   Cervical dystonia 08/25/2015   Headache 07/09/2015   Myofascial muscle pain 04/29/2015   Adhesive capsulitis of right shoulder 04/29/2015   Lower extremity edema 10/01/2014    Malignant neoplasm of overlapping sites of right breast in female, estrogen receptor positive (HCC) 05/24/2014   Myalgia and myositis 05/17/2014   Neoplasm related pain 03/12/2014   Constipation 11/26/2013   H/O reduction mammoplasty 10/30/2013   Seizure (HCC) 10/06/2013   Status post bilateral breast implants 08/31/2013   S/P breast reconstruction, right 08/23/2013   AC joint arthropathy 07/02/2013   Routine adult health maintenance 05/29/2013   Hypokalemia 05/21/2013   Chronic pain 04/19/2013   Acquired absence of breast and absent nipple 01/30/2013   RHINOSINUSITIS, CHRONIC 04/08/2010   Morbid obesity (HCC) 08/04/2006   DEPRESSIVE DISORDER, NOS 08/04/2006    PCP: Daleen Dubs, MD  REFERRING PROVIDER: Candee Cha, MD   REFERRING DIAG: I89.0 (ICD-10-CM) - Lymphedema of right arm   THERAPY DIAG:  Postmastectomy lymphedema  Stiffness of right shoulder, not elsewhere classified  History of right breast cancer  ONSET DATE: 2011  Rationale for Evaluation and Treatment Rehabilitation  SUBJECTIVE                                                                                                                                                                                           SUBJECTIVE STATEMENT: Took bandages off last night because the foam was coming out at my elbow.   PERTINENT HISTORY:  Right breast cancer with mastectomy 2011 and ALND 22 nodes; reconstruction; left breast reduction; h/o right UE lymphedema and right upper quadrant pain, treated in this clinic several times before.  PAIN:  Are you having pain?No not right now  PRECAUTIONS: Other: R UE lymphedema, has metal in ankle  RED FLAGS: none  WEIGHT BEARING RESTRICTIONS No  FALLS:  Has patient fallen in last 6 months? No  LIVING ENVIRONMENT: Lives with: lives with 3 sons Lives in: House/apartment Stairs: No;  Has following equipment at home: None  OCCUPATION: on disability  LEISURE:  trying to exercise by walking but has been bothered by her L ankle pain  HAND DOMINANCE : right   PRIOR LEVEL OF FUNCTION: Independent  PATIENT GOALS to get my arm down and try  to maintain it   OBJECTIVE  COGNITION:  Overall cognitive status: Within functional limits for tasks assessed    OBSERVATIONS / OTHER ASSESSMENTS: R upper arm considerable larger than left, area of swelling at posterior trunk just above band on bra  POSTURE: forward head, rounded shoulders  UPPER EXTREMITY AROM/PROM:  A/PROM RIGHT   eval  Right 11/18/23  Shoulder extension 55   Shoulder flexion 133 142  Shoulder abduction 85 102  Shoulder internal rotation 40   Shoulder external rotation 60     (Blank rows = not tested)  A/PROM LEFT   eval  Shoulder extension 63  Shoulder flexion 175  Shoulder abduction 171  Shoulder internal rotation 64  Shoulder external rotation 95    (Blank rows = not tested)   LYMPHEDEMA ASSESSMENTS:   SURGERY TYPE/DATE: R mastectomy and ALND in 2011  NUMBER OF LYMPH NODES REMOVED: 1/20  CHEMOTHERAPY: completed  RADIATION:completed Jan 2012  HORMONE TREATMENT: anastrozole  from Nov 2016 to Dec 2019  INFECTIONS: none  LYMPHEDEMA ASSESSMENTS:   LANDMARK RIGHT  Eval on 03/16/22 10/25/23 11/11/23 11/16/23  10 cm proximal to olecranon process 47.1 50.7 47.2 47.4  Olecranon process 32.6 34.5 34 34.5  10 cm proximal to ulnar styloid process 23.6 26 26.4 26.5  Just proximal to ulnar styloid process 17.7 18 17.7 17.3  Across hand at thumb web space 21.4 22 20.2 20.2  At base of 2nd digit 6.1 6.2 5.8 6.1  (Blank rows = not tested)  LANDMARK LEFT  Eval on 03/16/22 10/25/23  10 cm proximal to olecranon process 43 46  Olecranon process 30 32.4  10 cm proximal to ulnar styloid process 24.4 26.1  Just proximal to ulnar styloid process 17.3 18.6  Across hand at thumb web space 20.6 22.1  At base of 2nd digit 6.4 6.4  (Blank rows = not tested)   Flowsheet Row  Outpatient Rehab from 10/25/2023 in [name]  Lymphedema Life Impact Scale Total Score 72.06 %      TODAY'S TREATMENT  11/18/23: Manual Therapy In supine: Short neck, superficial and deep abdominals , Lt axillary and pectoral nodes, anterior intact thorax sequence and establishment of anterior inter-axillary pathway, Rt inguinal nodes and establishment of Rt axillo-inguinal pathway, then R UE working proximal to distal, moving inner upper arm outwards and upwards, and doing both sides of forearms, then retracing all steps P/ROM to Rt shoulder into flex, abd and D2 to pts tolerance. STM to Rt lateral trunk where pt palpably very tight   Compression Bandaging to Rt UE: Cocoa butter, TG soft, artiflex to antecubital fossa, fixated 1/4 foam to forearm and then upper arm with artiflex, artiflex to dorsal hand/wrist, then short stretch compression bandages as follows: 1 - 6 cm to hand/wrist with 1/2 gray foam to dorsal hand, then 1- 8 cm spiral from wrist with X at antecubital fossa, then 1 - 10 cm herring bone from wrist to mid upper arm, and then 1 - 12 cm spiral from wrist to axilla. Folded TG soft over top of bandage.   11/16/23: Manual Therapy In supine: Short neck, superficial and deep abdominals , Lt axillary and pectoral nodes and establishment of anterior inter-axillary pathway, Rt inguinal nodes and establishment of Rt axillo-inguinal pathway, then R UE working proximal to distal, moving inner upper arm outwards and upwards, and doing both sides of forearms, then retracing all steps P/ROM to Rt shoulder into flex, abd and D2 to pts tolerance.  Compression Bandaging  to Rt UE: Cocoa butter, TG soft, artiflex to antecubital fossa, fixated new 1/4 foam to forearm and then upper arm, Molelast to fingers 1-4, artiflex to dorsal hand/wrist, then short stretch compression bandages as follows: 1 - 6 cm to hand/wrist with 1/2 gray foam to dorsal hand, then 1- 8 cm spiral  from wrist with X at antecubital fossa, then 1 - 10 cm herring bone from wrist to mid upper arm, and then 1 - 12 cm spiral from wrist to axilla. Folded TG soft over top of bandage.   11/14/23: Manual Therapy In supine: Short neck, 5 diaphragmatic breaths , Lt axillary and pectoral nodes and establishment of anterior inter-axillary pathway, Rt inguinal nodes and establishment of Rt axillo-inguinal pathway, then R UE working proximal to distal, moving inner upper arm outwards and upwards, and doing both sides of forearms, then retracing all steps Compression Bandaging to Rt UE: Cocoa butter, TG soft, artiflex to antecubital fossa, fixated new 1/4 foam to forearm and then upper arm, Molelast to fingers 1-4, artiflex to dorsal hand/wrist, then short stretch compression bandages as follows: 1 - 6 cm to hand/wrist with 1/2 gray foam to dorsal hand, then 1- 8 cm spiral from wrist with X at antecubital fossa, then 1 - 10 cm herring bone from wrist to mid upper arm, and then 1 - 12 cm spiral from wrist to axilla. Folded TG soft over top of bandage.        PATIENT EDUCATION:  Education details: new night time garments available, bring pictures of old compression pumps, need for flat knit daytime garment, need for compression bra [place] educated: Patient Education method: Explanation Education comprehension: verbalized understanding   HOME EXERCISE PROGRAM: None yet  ASSESSMENT:  CLINICAL IMPRESSION: Continued with CDT to Rt UE and stretching of Rt shoulder. Reminded pt that if the bandages slide down her arm due to circumferential reductions that she can take off the top 1 or 2 bandages and reapply them as she has been previously instructed to in a spiral fashion. She verbalized understanding.    OBJECTIVE IMPAIRMENTS decreased ROM and increased edema.   ACTIVITY LIMITATIONS carrying, lifting, and reach over head  PARTICIPATION LIMITATIONS: none  PERSONAL FACTORS Time since onset of  injury/illness/exacerbation are also affecting patient's functional outcome.   REHAB POTENTIAL: Good  CLINICAL DECISION MAKING: Stable/uncomplicated  EVALUATION COMPLEXITY: Low  GOALS: Goals reviewed with patient? Yes  SHORT TERM GOALS: Target date: 11/15/23   Pt will demonstrate a 2 cm reduction in edema at 10 cm proximal to olecranon process.  Baseline:  Goal status: INITIAL  2.  Pt will demonstrate 110 degrees of R shoulder abduction to allow pt to reach out to the side.  Baseline: 85 Goal status: INITIAL  3.  Pt will obtain a compression bra for long term management of truncal edema especially posterior..  Baseline:  Goal status: INITIAL  4.  Pt will demonstrate 150 degrees of R shoulder flexion to allow her to reach overhead.  Baseline: 133 Goal status: INITIAL   LONG TERM GOALS: Target date: 12/06/23    Pt will demonstrate a 3 cm reduction in edema at 10 cm proximal to olecranon process.  Baseline:  Goal status: INITIAL  2.  Pt will demonstrate 165 degrees of R shoulder abduction to allow pt to reach out to the side.  Baseline:  Goal status: INITIAL  3.  Pt will obtain appropriate day time and night time garments for long term management of R UE lymphedema.  Baseline:  Goal status: INITIAL  4.  Pt will be able to independently manage her lymphedema through self MLD and compression garments.  Baseline:  Goal status: INITIAL  5.  Pt will demonstrate 165 degrees of R shoulder flexion to allow her to reach overhead.  Baseline:  Goal status: INITIAL   PLAN: PT FREQUENCY: 3x/week  PT DURATION: 6 weeks  PLANNED INTERVENTIONS: Therapeutic exercises, Therapeutic activity, Patient/Family education, Self Care, Joint mobilization, Orthotic/Fit training, Manual lymph drainage, Compression bandaging, Vasopneumatic device, Manual therapy, and Re-evaluation  PLAN FOR NEXT SESSION: Did she rewrap her arm? How did it go? Pt to be measured by [facility] after appt on 6/18 at  3:00. Cont CDT for Rt UE, pt interested in circaid profile night time garment.   Denyce Flank, PTA 11/18/2023, 11:57 AM

## 2023-11-21 ENCOUNTER — Ambulatory Visit

## 2023-11-21 DIAGNOSIS — I972 Postmastectomy lymphedema syndrome: Secondary | ICD-10-CM | POA: Diagnosis not present

## 2023-11-21 DIAGNOSIS — Z853 Personal history of malignant neoplasm of breast: Secondary | ICD-10-CM | POA: Diagnosis not present

## 2023-11-21 DIAGNOSIS — M25611 Stiffness of right shoulder, not elsewhere classified: Secondary | ICD-10-CM | POA: Diagnosis not present

## 2023-11-21 NOTE — Therapy (Signed)
 OUTPATIENT PHYSICAL THERAPY ONCOLOGY TREATMENT  Patient Name: Karen Hopkins MRN: [id number] DOB:[birth year], 48 y.o., female Today's Date: 11/21/2023   PT End of Session - 11/21/23 1405     Visit Number 9    Number of Visits 19    Date for PT Re-Evaluation 12/06/23    PT Start Time 1401    PT Stop Time 1503    PT Time Calculation (min) 62 min    Activity Tolerance Patient tolerated treatment well    Behavior During Therapy [facility] for tasks assessed/performed            Past Medical History:  Diagnosis Date   Anxiety    Arthritis    Cancer (HCC) 2011   Rt. Br. Ca   Depression    Diabetes mellitus without complication (HCC)    has been borderline   Essential hypertension 11/05/2015   H. pylori infection    History of radiation therapy    Hypertension    under control with med., has been on med. x 2 yr.   Lymphedema of arm    right; no BP or puncture to right arm   Personal history of chemotherapy 09/16/2009   rt breast   Pneumonia    Seasonal allergies    Sinus headache    Past Surgical History:  Procedure Laterality Date   BREAST BIOPSY Right 08/26/2009   BREAST REDUCTION SURGERY Left 08/23/2013   Procedure: LEFT BREAST REDUCTION  ;  Surgeon: Marilou Showman, DO;  Location: [facility];  Service: Plastics;  Laterality: Left;   BREAST REDUCTION WITH MASTOPEXY Left 08/22/2019   Procedure: Left breast mastopexy reduction with liposuction for breast symmetry;  Surgeon: Thornell Flirt, DO;  Location: [facility];  Service: Plastics;  Laterality: Left;   CESAREAN SECTION  07/15/2001; 03/18/2004; 11/21/2008   lateral orbiotomy Left 11/2015   Dr. Abel Abelson at [facility]   LATISSIMUS FLAP TO BREAST Right 03/12/2013   Procedure: RIGHT BREAST LATISSIMUS FLAP WITH EXPANDER PLACEMENT;  Surgeon: Marilou Showman, DO;  Location: [facility];  Service: Plastics;  Laterality: Right;   LIPOSUCTION Bilateral 08/23/2013   Procedure: LIPOSUCTION;  Surgeon: Marilou Showman,  DO;  Location: [facility];  Service: Plastics;  Laterality: Bilateral;   LIPOSUCTION WITH LIPOFILLING Left 08/22/2019   Procedure: LIPOSUCTION WITH LIPOFILLING;  Surgeon: Thornell Flirt, DO;  Location: [facility];  Service: Plastics;  Laterality: Left;   MASTECTOMY Right 2011   MODIFIED RADICAL MASTECTOMY Right 03/11/2010   NASAL SEPTUM SURGERY     ORIF ANKLE FRACTURE Left 07/18/2018   Procedure: OPEN REDUCTION INTERNAL FIXATION (ORIF) LEFT ANKLE FRACTURE WITH  SYNDESMOSIS AND ANKLE ARTHROTOMY;  Surgeon: Donnamarie Gables, MD;  Location: [facility];  Service: Orthopedics;  Laterality: Left;   PORT-A-CATH REMOVAL Left 12/01/2010   PORTACATH PLACEMENT Left 09/12/2009   REDUCTION MAMMAPLASTY Left 08/2013   REMOVAL OF TISSUE EXPANDER AND PLACEMENT OF IMPLANT Right 08/23/2013   Procedure: REMOVAL RIGHT TISSUE EXPANDER AND PLACEMENT OF IMPLANT TO RIGHT BREAST ;  Surgeon: Marilou Showman, DO;  Location: [facility];  Service: Plastics;  Laterality: Right;   SUPRA-UMBILICAL HERNIA  2006   TUBAL LIGATION  11/21/2008   UMBILICAL HERNIA REPAIR  04/12/2022   CCS DR   Patient Active Problem List   Diagnosis Date Noted   Contraceptive, surveillance, intrauterine device 04/13/2023   Nephrolithiasis 10/21/2022   H. pylori infection 09/25/2022   Abdominal hernia 09/25/2022   Seasonal allergies  09/25/2022   Carpal tunnel syndrome 10/07/2021   Genetic testing 08/14/2019   Breast asymmetry following reconstructive surgery 07/13/2019   Family history of breast cancer    Family history of prostate cancer    Unintentional weight loss 05/21/2019   Lymphedema of right arm 02/27/2018   Dyspnea 09/23/2016   Eczema 08/31/2016   Essential hypertension 11/05/2015   Cervical dystonia 08/25/2015   Headache 07/09/2015   Myofascial muscle pain 04/29/2015   Adhesive capsulitis of right shoulder 04/29/2015   Lower extremity edema 10/01/2014    Malignant neoplasm of overlapping sites of right breast in female, estrogen receptor positive (HCC) 05/24/2014   Myalgia and myositis 05/17/2014   Neoplasm related pain 03/12/2014   Constipation 11/26/2013   H/O reduction mammoplasty 10/30/2013   Seizure (HCC) 10/06/2013   Status post bilateral breast implants 08/31/2013   S/P breast reconstruction, right 08/23/2013   AC joint arthropathy 07/02/2013   Routine adult health maintenance 05/29/2013   Hypokalemia 05/21/2013   Chronic pain 04/19/2013   Acquired absence of breast and absent nipple 01/30/2013   RHINOSINUSITIS, CHRONIC 04/08/2010   Morbid obesity (HCC) 08/04/2006   DEPRESSIVE DISORDER, NOS 08/04/2006    PCP: Daleen Dubs, MD  REFERRING PROVIDER: Candee Cha, MD   REFERRING DIAG: I89.0 (ICD-10-CM) - Lymphedema of right arm   THERAPY DIAG:  Postmastectomy lymphedema  Stiffness of right shoulder, not elsewhere classified  History of right breast cancer  ONSET DATE: 2011  Rationale for Evaluation and Treatment Rehabilitation  SUBJECTIVE                                                                                                                                                                                           SUBJECTIVE STATEMENT: Left my bandages on until Sunday.  PERTINENT HISTORY:  Right breast cancer with mastectomy 2011 and ALND 22 nodes; reconstruction; left breast reduction; h/o right UE lymphedema and right upper quadrant pain, treated in this clinic several times before.  PAIN:  Are you having pain?No not right now  PRECAUTIONS: Other: R UE lymphedema, has metal in ankle  RED FLAGS: none  WEIGHT BEARING RESTRICTIONS No  FALLS:  Has patient fallen in last 6 months? No  LIVING ENVIRONMENT: Lives with: lives with 3 sons Lives in: House/apartment Stairs: No;  Has following equipment at home: None  OCCUPATION: on disability  LEISURE: trying to exercise by walking but has  been bothered by her L ankle pain  HAND DOMINANCE : right   PRIOR LEVEL OF FUNCTION: Independent  PATIENT GOALS to get my arm down and try to maintain it   OBJECTIVE  COGNITION:  Overall cognitive status: Within functional limits for tasks assessed    OBSERVATIONS / OTHER ASSESSMENTS: R upper arm considerable larger than left, area of swelling at posterior trunk just above band on bra  POSTURE: forward head, rounded shoulders  UPPER EXTREMITY AROM/PROM:  A/PROM RIGHT   eval  Right 11/18/23  Shoulder extension 55   Shoulder flexion 133 142  Shoulder abduction 85 102  Shoulder internal rotation 40   Shoulder external rotation 60     (Blank rows = not tested)  A/PROM LEFT   eval  Shoulder extension 63  Shoulder flexion 175  Shoulder abduction 171  Shoulder internal rotation 64  Shoulder external rotation 95    (Blank rows = not tested)   LYMPHEDEMA ASSESSMENTS:   SURGERY TYPE/DATE: R mastectomy and ALND in 2011  NUMBER OF LYMPH NODES REMOVED: 1/20  CHEMOTHERAPY: completed  RADIATION:completed Jan 2012  HORMONE TREATMENT: anastrozole  from Nov 2016 to Dec 2019  INFECTIONS: none  LYMPHEDEMA ASSESSMENTS:   LANDMARK RIGHT  Eval on 03/16/22 10/25/23 11/11/23 11/16/23  10 cm proximal to olecranon process 47.1 50.7 47.2 47.4  Olecranon process 32.6 34.5 34 34.5  10 cm proximal to ulnar styloid process 23.6 26 26.4 26.5  Just proximal to ulnar styloid process 17.7 18 17.7 17.3  Across hand at thumb web space 21.4 22 20.2 20.2  At base of 2nd digit 6.1 6.2 5.8 6.1  (Blank rows = not tested)  LANDMARK LEFT  Eval on 03/16/22 10/25/23  10 cm proximal to olecranon process 43 46  Olecranon process 30 32.4  10 cm proximal to ulnar styloid process 24.4 26.1  Just proximal to ulnar styloid process 17.3 18.6  Across hand at thumb web space 20.6 22.1  At base of 2nd digit 6.4 6.4  (Blank rows = not tested)   Flowsheet Row Outpatient Rehab from 10/25/2023 in [facility]  Lymphedema Life Impact Scale Total Score 72.06 %      TODAY'S TREATMENT  11/21/23: Manual Therapy In supine: Short neck, superficial and deep abdominals , Lt axillary and pectoral nodes, anterior intact thorax sequence and establishment of anterior inter-axillary pathway, Rt inguinal nodes and establishment of Rt axillo-inguinal pathway, then R UE working proximal to distal, moving inner upper arm outwards and upwards, and doing both sides of forearms, then retracing all steps P/ROM to Rt shoulder into flex, abd and D2 with scapular depression STM to Rt lateral trunk where pt palpably very tight and deep tissue pressurealso to her UT which was spasming during session today Compression Bandaging to Rt UE: Cocoa butter, TG soft, artiflex to antecubital fossa, fixated 1/4 foam to forearm and then upper arm with artiflex, artiflex to dorsal hand/wrist, then short stretch compression bandages as follows: 1 - 6 cm to hand/wrist with 1/2 gray foam to dorsal hand, then 1- 8 cm spiral from wrist with X at antecubital fossa, then 1 - 10 cm herring bone from wrist to mid upper arm, and then 1 - 12 cm spiral from wrist to axilla. Folded TG soft over top of bandage.   11/18/23: Manual Therapy In supine: Short neck, superficial and deep abdominals , Lt axillary and pectoral nodes, anterior intact thorax sequence and establishment of anterior inter-axillary pathway, Rt inguinal nodes and establishment of Rt axillo-inguinal pathway, then R UE working proximal to distal, moving inner upper arm outwards and upwards, and doing both sides of forearms, then retracing all steps P/ROM to Rt shoulder into flex, abd and D2  to pts tolerance. STM to Rt lateral trunk where pt palpably very tight   Compression Bandaging to Rt UE: Cocoa butter, TG soft, artiflex to antecubital fossa, fixated 1/4 foam to forearm and then upper arm with artiflex, artiflex to dorsal hand/wrist, then short  stretch compression bandages as follows: 1 - 6 cm to hand/wrist with 1/2 gray foam to dorsal hand, then 1- 8 cm spiral from wrist with X at antecubital fossa, then 1 - 10 cm herring bone from wrist to mid upper arm, and then 1 - 12 cm spiral from wrist to axilla. Folded TG soft over top of bandage.   11/16/23: Manual Therapy In supine: Short neck, superficial and deep abdominals , Lt axillary and pectoral nodes and establishment of anterior inter-axillary pathway, Rt inguinal nodes and establishment of Rt axillo-inguinal pathway, then R UE working proximal to distal, moving inner upper arm outwards and upwards, and doing both sides of forearms, then retracing all steps P/ROM to Rt shoulder into flex, abd and D2 to pts tolerance.  Compression Bandaging to Rt UE: Cocoa butter, TG soft, artiflex to antecubital fossa, fixated new 1/4 foam to forearm and then upper arm, Molelast to fingers 1-4, artiflex to dorsal hand/wrist, then short stretch compression bandages as follows: 1 - 6 cm to hand/wrist with 1/2 gray foam to dorsal hand, then 1- 8 cm spiral from wrist with X at antecubital fossa, then 1 - 10 cm herring bone from wrist to mid upper arm, and then 1 - 12 cm spiral from wrist to axilla. Folded TG soft over top of bandage.        PATIENT EDUCATION:  Education details: new night time garments available, bring pictures of old compression pumps, need for flat knit daytime garment, need for compression bra [employer] educated: Patient Education method: Explanation Education comprehension: verbalized understanding   HOME EXERCISE PROGRAM: None yet  ASSESSMENT:  CLINICAL IMPRESSION: Continued with CDT  to Rt UE. Pt will be measured for new compression garments Wednesday, 6/18.   OBJECTIVE IMPAIRMENTS decreased ROM and increased edema.   ACTIVITY LIMITATIONS carrying, lifting, and reach over head  PARTICIPATION LIMITATIONS: none  PERSONAL FACTORS Time since onset of  injury/illness/exacerbation are also affecting patient's functional outcome.   REHAB POTENTIAL: Good  CLINICAL DECISION MAKING: Stable/uncomplicated  EVALUATION COMPLEXITY: Low  GOALS: Goals reviewed with patient? Yes  SHORT TERM GOALS: Target date: 11/15/23   Pt will demonstrate a 2 cm reduction in edema at 10 cm proximal to olecranon process.  Baseline:  Goal status: INITIAL  2.  Pt will demonstrate 110 degrees of R shoulder abduction to allow pt to reach out to the side.  Baseline: 85 Goal status: INITIAL  3.  Pt will obtain a compression bra for long term management of truncal edema especially posterior..  Baseline:  Goal status: INITIAL  4.  Pt will demonstrate 150 degrees of R shoulder flexion to allow her to reach overhead.  Baseline: 133 Goal status: INITIAL   LONG TERM GOALS: Target date: 12/06/23    Pt will demonstrate a 3 cm reduction in edema at 10 cm proximal to olecranon process.  Baseline:  Goal status: INITIAL  2.  Pt will demonstrate 165 degrees of R shoulder abduction to allow pt to reach out to the side.  Baseline:  Goal status: INITIAL  3.  Pt will obtain appropriate day time and night time garments for long term management of R UE lymphedema.  Baseline:  Goal status: INITIAL  4.  Pt  will be able to independently manage her lymphedema through self MLD and compression garments.  Baseline:  Goal status: INITIAL  5.  Pt will demonstrate 165 degrees of R shoulder flexion to allow her to reach overhead.  Baseline:  Goal status: INITIAL   PLAN: PT FREQUENCY: 3x/week  PT DURATION: 6 weeks  PLANNED INTERVENTIONS: Therapeutic exercises, Therapeutic activity, Patient/Family education, Self Care, Joint mobilization, Orthotic/Fit training, Manual lymph drainage, Compression bandaging, Vasopneumatic device, Manual therapy, and Re-evaluation  PLAN FOR NEXT SESSION: Can pt self bandage if therapist unable after being measured Wednesday. Pt to be measured  by [facility] after appt on 6/18 at 3:00. Cont CDT for Rt UE, pt interested in circaid profile night time garment.   Denyce Flank, PTA 11/21/2023, 3:05 PM

## 2023-11-23 ENCOUNTER — Ambulatory Visit

## 2023-11-23 DIAGNOSIS — Z853 Personal history of malignant neoplasm of breast: Secondary | ICD-10-CM | POA: Diagnosis not present

## 2023-11-23 DIAGNOSIS — I972 Postmastectomy lymphedema syndrome: Secondary | ICD-10-CM | POA: Diagnosis not present

## 2023-11-23 DIAGNOSIS — M25611 Stiffness of right shoulder, not elsewhere classified: Secondary | ICD-10-CM

## 2023-11-23 NOTE — Therapy (Addendum)
 OUTPATIENT PHYSICAL THERAPY ONCOLOGY TREATMENT  Progress Note Reporting Period 10/25/2023 to 11/23/2023  See note below for Objective Data and Assessment of Progress/Goals.     Patient Name: Karen Hopkins MRN: [id number] DOB:April 30, 1976, 48 y.o., female Today's Date: 11/23/2023   PT End of Session - 11/23/23 1406     Visit Number 10    Number of Visits 19    Date for PT Re-Evaluation 12/06/23    PT Start Time 1403    PT Stop Time 1500    PT Time Calculation (min) 57 min    Activity Tolerance Patient tolerated treatment well    Behavior During Therapy [facility] for tasks assessed/performed            Past Medical History:  Diagnosis Date   Anxiety    Arthritis    Cancer (HCC) 2011   Rt. Br. Ca   Depression    Diabetes mellitus without complication (HCC)    has been borderline   Essential hypertension 11/05/2015   H. pylori infection    History of radiation therapy    Hypertension    under control with med., has been on med. x 2 yr.   Lymphedema of arm    right; no BP or puncture to right arm   Personal history of chemotherapy 09/16/2009   rt breast   Pneumonia    Seasonal allergies    Sinus headache    Past Surgical History:  Procedure Laterality Date   BREAST BIOPSY Right 08/26/2009   BREAST REDUCTION SURGERY Left 08/23/2013   Procedure: LEFT BREAST REDUCTION  ;  Surgeon: Marilou Showman, DO;  Location: [facility];  Service: Plastics;  Laterality: Left;   BREAST REDUCTION WITH MASTOPEXY Left 08/22/2019   Procedure: Left breast mastopexy reduction with liposuction for breast symmetry;  Surgeon: Thornell Flirt, DO;  Location: [facility];  Service: Plastics;  Laterality: Left;   CESAREAN SECTION  07/15/2001; 03/18/2004; 11/21/2008   lateral orbiotomy Left 11/2015   Dr. Abel Abelson at [facility]   LATISSIMUS FLAP TO BREAST Right 03/12/2013   Procedure: RIGHT BREAST LATISSIMUS FLAP WITH EXPANDER PLACEMENT;  Surgeon: Marilou Showman, DO;  Location:  [facility];  Service: Plastics;  Laterality: Right;   LIPOSUCTION Bilateral 08/23/2013   Procedure: LIPOSUCTION;  Surgeon: Marilou Showman, DO;  Location: [facility];  Service: Plastics;  Laterality: Bilateral;   LIPOSUCTION WITH LIPOFILLING Left 08/22/2019   Procedure: LIPOSUCTION WITH LIPOFILLING;  Surgeon: Thornell Flirt, DO;  Location: [facility];  Service: Plastics;  Laterality: Left;   MASTECTOMY Right 2011   MODIFIED RADICAL MASTECTOMY Right 03/11/2010   NASAL SEPTUM SURGERY     ORIF ANKLE FRACTURE Left 07/18/2018   Procedure: OPEN REDUCTION INTERNAL FIXATION (ORIF) LEFT ANKLE FRACTURE WITH  SYNDESMOSIS AND ANKLE ARTHROTOMY;  Surgeon: Donnamarie Gables, MD;  Location: [facility];  Service: Orthopedics;  Laterality: Left;   PORT-A-CATH REMOVAL Left 12/01/2010   PORTACATH PLACEMENT Left 09/12/2009   REDUCTION MAMMAPLASTY Left 08/2013   REMOVAL OF TISSUE EXPANDER AND PLACEMENT OF IMPLANT Right 08/23/2013   Procedure: REMOVAL RIGHT TISSUE EXPANDER AND PLACEMENT OF IMPLANT TO RIGHT BREAST ;  Surgeon: Marilou Showman, DO;  Location: [facility];  Service: Plastics;  Laterality: Right;   SUPRA-UMBILICAL HERNIA  2006   TUBAL LIGATION  11/21/2008   UMBILICAL HERNIA REPAIR  04/12/2022   CCS DR   Patient Active Problem List   Diagnosis Date Noted   Contraceptive, surveillance,  intrauterine device 04/13/2023   Nephrolithiasis 10/21/2022   H. pylori infection 09/25/2022   Abdominal hernia 09/25/2022   Seasonal allergies 09/25/2022   Carpal tunnel syndrome 10/07/2021   Genetic testing 08/14/2019   Breast asymmetry following reconstructive surgery 07/13/2019   Family history of breast cancer    Family history of prostate cancer    Unintentional weight loss 05/21/2019   Lymphedema of right arm 02/27/2018   Dyspnea 09/23/2016   Eczema 08/31/2016   Essential hypertension 11/05/2015   Cervical dystonia 08/25/2015   Headache  07/09/2015   Myofascial muscle pain 04/29/2015   Adhesive capsulitis of right shoulder 04/29/2015   Lower extremity edema 10/01/2014   Malignant neoplasm of overlapping sites of right breast in female, estrogen receptor positive (HCC) 05/24/2014   Myalgia and myositis 05/17/2014   Neoplasm related pain 03/12/2014   Constipation 11/26/2013   H/O reduction mammoplasty 10/30/2013   Seizure (HCC) 10/06/2013   Status post bilateral breast implants 08/31/2013   S/P breast reconstruction, right 08/23/2013   AC joint arthropathy 07/02/2013   Routine adult health maintenance 05/29/2013   Hypokalemia 05/21/2013   Chronic pain 04/19/2013   Acquired absence of breast and absent nipple 01/30/2013   RHINOSINUSITIS, CHRONIC 04/08/2010   Morbid obesity (HCC) 08/04/2006   DEPRESSIVE DISORDER, NOS 08/04/2006    PCP: Daleen Dubs, MD  REFERRING PROVIDER: Candee Cha, MD   REFERRING DIAG: I89.0 (ICD-10-CM) - Lymphedema of right arm   THERAPY DIAG:  Postmastectomy lymphedema  Stiffness of right shoulder, not elsewhere classified  History of right breast cancer  ONSET DATE: 2011  Rationale for Evaluation and Treatment Rehabilitation  SUBJECTIVE                                                                                                                                                                                           SUBJECTIVE STATEMENT: Nothing new since Monday. Bandages came off yesterday, the elbow keeps pushing thru the bandages.   PERTINENT HISTORY:  Right breast cancer with mastectomy 2011 and ALND 22 nodes; reconstruction; left breast reduction; h/o right UE lymphedema and right upper quadrant pain, treated in this clinic several times before.  PAIN:  Are you having pain?No not right now  PRECAUTIONS: Other: R UE lymphedema, has metal in ankle  RED FLAGS: none  WEIGHT BEARING RESTRICTIONS No  FALLS:  Has patient fallen in last 6 months? No  LIVING  ENVIRONMENT: Lives with: lives with 3 sons Lives in: House/apartment Stairs: No;  Has following equipment at home: None  OCCUPATION: on disability  LEISURE: trying to exercise by walking but has been bothered by her L ankle  pain  HAND DOMINANCE : right   PRIOR LEVEL OF FUNCTION: Independent  PATIENT GOALS to get my arm down and try to maintain it   OBJECTIVE  COGNITION:  Overall cognitive status: Within functional limits for tasks assessed    OBSERVATIONS / OTHER ASSESSMENTS: R upper arm considerable larger than left, area of swelling at posterior trunk just above band on bra  POSTURE: forward head, rounded shoulders  UPPER EXTREMITY AROM/PROM:  A/PROM RIGHT   eval  Right 11/18/23 Right  11/23/23  Shoulder extension 55    Shoulder flexion 133 142 145  Shoulder abduction 85 102 106  Shoulder internal rotation 40    Shoulder external rotation 60      (Blank rows = not tested)  A/PROM LEFT   eval  Shoulder extension 63  Shoulder flexion 175  Shoulder abduction 171  Shoulder internal rotation 64  Shoulder external rotation 95    (Blank rows = not tested)   LYMPHEDEMA ASSESSMENTS:   SURGERY TYPE/DATE: R mastectomy and ALND in 2011  NUMBER OF LYMPH NODES REMOVED: 1/20  CHEMOTHERAPY: completed  RADIATION:completed Jan 2012  HORMONE TREATMENT: anastrozole  from Nov 2016 to Dec 2019  INFECTIONS: none  LYMPHEDEMA ASSESSMENTS:   LANDMARK RIGHT  Eval on 03/16/22 10/25/23 11/11/23 11/16/23 11/23/23  10 cm proximal to olecranon process 47.1 50.7 47.2 47.4 47.1  Olecranon process 32.6 34.5 34 34.5 34.1  10 cm proximal to ulnar styloid process 23.6 26 26.4 26.5 25.4  Just proximal to ulnar styloid process 17.7 18 17.7 17.3 17.4  Across hand at thumb web space 21.4 22 20.2 20.2 20.3  At base of 2nd digit 6.1 6.2 5.8 6.1 5.8  (Blank rows = not tested)  LANDMARK LEFT  Eval on 03/16/22 10/25/23  10 cm proximal to olecranon process 43 46  Olecranon process 30 32.4   10 cm proximal to ulnar styloid process 24.4 26.1  Just proximal to ulnar styloid process 17.3 18.6  Across hand at thumb web space 20.6 22.1  At base of 2nd digit 6.4 6.4  (Blank rows = not tested)   Flowsheet Row Outpatient Rehab from 10/25/2023 [facility]  Lymphedema Life Impact Scale Total Score 72.06 %      TODAY'S TREATMENT  11/23/23: Pt measured for compression garments by fitter at beginning of session. Measured for custom flat knit Mediven 550 Rt UE sleeve and glove x 2 and Circaid Profile nighttime garment x 1 that she will need x 99 years Manual Therapy In supine: Short neck, superficial and deep abdominals , Lt axillary and pectoral nodes, anterior intact thorax sequence and establishment of anterior inter-axillary pathway, Rt inguinal nodes and establishment of Rt axillo-inguinal pathway, then R UE working proximal to distal, moving inner upper arm outwards and upwards, and doing both sides of forearms, then retracing all steps Compression Bandaging to Rt UE: Cocoa butter, TG soft, artiflex to antecubital fossa, fixated 1/4 foam to forearm and then upper arm with Idealbinde, Molelast to fingers 1-4, artiflex to dorsal hand/wrist, then short stretch compression bandages as follows: 1 - 6 cm to hand/wrist with 1/2 gray foam to dorsal hand, then 1- 8 cm spiral from wrist with X at antecubital fossa, then 1 - 10 cm herring bone from wrist to mid upper arm, and then 1 - 12 cm spiral from wrist to axilla. Folded TG soft over top of bandage.   11/21/23: Manual Therapy In supine: Short neck, superficial and deep abdominals , Lt axillary  and pectoral nodes, anterior intact thorax sequence and establishment of anterior inter-axillary pathway, Rt inguinal nodes and establishment of Rt axillo-inguinal pathway, then R UE working proximal to distal, moving inner upper arm outwards and upwards, and doing both sides of forearms, then retracing all steps P/ROM to  Rt shoulder into flex, abd and D2 with scapular depression STM to Rt lateral trunk where pt palpably very tight and deep tissue pressurealso to her UT which was spasming during session today Compression Bandaging to Rt UE: Cocoa butter, TG soft, artiflex to antecubital fossa, fixated 1/4 foam to forearm and then upper arm with artiflex, artiflex to dorsal hand/wrist, then short stretch compression bandages as follows: 1 - 6 cm to hand/wrist with 1/2 gray foam to dorsal hand, then 1- 8 cm spiral from wrist with X at antecubital fossa, then 1 - 10 cm herring bone from wrist to mid upper arm, and then 1 - 12 cm spiral from wrist to axilla. Folded TG soft over top of bandage.   11/18/23: Manual Therapy In supine: Short neck, superficial and deep abdominals , Lt axillary and pectoral nodes, anterior intact thorax sequence and establishment of anterior inter-axillary pathway, Rt inguinal nodes and establishment of Rt axillo-inguinal pathway, then R UE working proximal to distal, moving inner upper arm outwards and upwards, and doing both sides of forearms, then retracing all steps P/ROM to Rt shoulder into flex, abd and D2 to pts tolerance. STM to Rt lateral trunk where pt palpably very tight   Compression Bandaging to Rt UE: Cocoa butter, TG soft, artiflex to antecubital fossa, fixated 1/4 foam to forearm and then upper arm with artiflex, artiflex to dorsal hand/wrist, then short stretch compression bandages as follows: 1 - 6 cm to hand/wrist with 1/2 gray foam to dorsal hand, then 1- 8 cm spiral from wrist with X at antecubital fossa, then 1 - 10 cm herring bone from wrist to mid upper arm, and then 1 - 12 cm spiral from wrist to axilla. Folded TG soft over top of bandage.        PATIENT EDUCATION:  Education details: new night time garments available, bring pictures of old compression pumps, need for flat knit daytime garment, need for compression bra Waltz educated: Patient Education method:  Explanation Education comprehension: verbalized understanding   HOME EXERCISE PROGRAM: None yet  ASSESSMENT:  CLINICAL IMPRESSION: Pt was measured for day and nighttime compression garments today. Continued with CDT to Rt UE and was able to switch to Idealbind (we were out of this but just had some more delivered yesterday) to fixate foam which pt reports made her bandage feel much more secure. 10th visit Medicare note sent today.    OBJECTIVE IMPAIRMENTS decreased ROM and increased edema.   ACTIVITY LIMITATIONS carrying, lifting, and reach over head  PARTICIPATION LIMITATIONS: none  PERSONAL FACTORS Time since onset of injury/illness/exacerbation are also affecting patient's functional outcome.   REHAB POTENTIAL: Good  CLINICAL DECISION MAKING: Stable/uncomplicated  EVALUATION COMPLEXITY: Low  GOALS: Goals reviewed with patient? Yes  SHORT TERM GOALS: Target date: 11/15/23   Pt will demonstrate a 2 cm reduction in edema at 10 cm proximal to olecranon process.  Baseline: 50.7 cm; 11/23/23 - 47.1 (3.6 cm reduction) Goal status: MET  2.  Pt will demonstrate 110 degrees of R shoulder abduction to allow pt to reach out to the side.  Baseline: 85; 11/23/23 - 106 Goal status: MET  3.  Pt will obtain a compression bra for long term management  of truncal edema especially posterior..  Baseline:  Goal status: MET  4.  Pt will demonstrate 150 degrees of R shoulder flexion to allow her to reach overhead.  Baseline: 133; 11/23/23 - 145 degrees Goal status: ONGOING   LONG TERM GOALS: Target date: 12/06/23    Pt will demonstrate a 3 cm reduction in edema at 10 cm proximal to olecranon process.  Baseline: 50.7 cm; 11/23/23 - 47.1 cm (3.6 cm reduction) Goal status: MET  2.  Pt will demonstrate 165 degrees of R shoulder abduction to allow pt to reach out to the side.  Baseline: 85 degrees; 11/23/23 - 106 degrees Goal status: ONGOING  3.  Pt will obtain appropriate day time and night  time garments for long term management of R UE lymphedema.  Baseline: Needs new garments; 11/23/23 - pt was just measured today for new day and nighttime garments Goal status: PARTIALLY MET  4.  Pt will be able to independently manage her lymphedema through self MLD and compression garments.  Baseline: Pt is independent with self MLD and has been measured for compression garments. Goal status: PARTIALLY MET  5.  Pt will demonstrate 165 degrees of R shoulder flexion to allow her to reach overhead.  Baseline: 13 degrees; 11/23/23 - 145 degrees Goal status: ONGOING   PLAN: PT FREQUENCY: 3x/week  PT DURATION: 6 weeks  PLANNED INTERVENTIONS: Therapeutic exercises, Therapeutic activity, Patient/Family education, Self Care, Joint mobilization, Orthotic/Fit training, Manual lymph drainage, Compression bandaging, Vasopneumatic device, Manual therapy, and Re-evaluation  PLAN FOR NEXT SESSION:  10 th visit note done today; Cont CDT for Rt UE until new garments arrive   Denyce Flank, PTA 11/23/2023, 4:23 PM  Karen Hopkins, PT 11/23/23 5:14 PM

## 2023-11-24 ENCOUNTER — Ambulatory Visit: Admitting: Family Medicine

## 2023-11-24 ENCOUNTER — Encounter: Payer: Self-pay | Admitting: Family Medicine

## 2023-11-24 VITALS — BP 134/84 | HR 100 | Ht 64.0 in | Wt 266.2 lb

## 2023-11-24 DIAGNOSIS — C50811 Malignant neoplasm of overlapping sites of right female breast: Secondary | ICD-10-CM | POA: Diagnosis not present

## 2023-11-24 DIAGNOSIS — Z0000 Encounter for general adult medical examination without abnormal findings: Secondary | ICD-10-CM

## 2023-11-24 DIAGNOSIS — I890 Lymphedema, not elsewhere classified: Secondary | ICD-10-CM

## 2023-11-24 DIAGNOSIS — Z170 Estrogen receptor positive status [ER+]: Secondary | ICD-10-CM | POA: Diagnosis not present

## 2023-11-24 DIAGNOSIS — R7303 Prediabetes: Secondary | ICD-10-CM | POA: Diagnosis not present

## 2023-11-24 MED ORDER — SEMAGLUTIDE(0.25 OR 0.5MG/DOS) 2 MG/1.5ML ~~LOC~~ SOPN: 0.2500 mg | PEN_INJECTOR | SUBCUTANEOUS | 1 refills | Status: DC

## 2023-11-24 NOTE — Patient Instructions (Addendum)
 It was great to see you again today.  Schedule nurse visit for HPV vaccine  Call the [facility] to set up an appointment for weight management. Their number is [phone].   Referral placed to oncology - let us  know if you haven't heard anything within a week or 2.  Trying to see if ozempic  will be covered for your prediabetes.  Be well, Dr. Dawn Eth

## 2023-11-24 NOTE — Progress Notes (Signed)
  Date of Visit: 11/24/2023   SUBJECTIVE:   HPI:  Karen Hopkins presents today for.  Obesity: Last visit we tried to initiate Zepbound  and then Wegovy .  Her insurance has denied these.  She is frustrated by this.  Willing to go to healthy weight and wellness clinic.  Oncology referral: Needs referral to oncology for follow-up of her breast cancer as it has been a while since she has been seen there.  Lymphedema: Did get in with lymphedema treatment clinic and has been doing well with that.   OBJECTIVE:   BP 134/84   Pulse 100   Wt 266 lb 3.2 oz (120.7 kg)   SpO2 91%   BMI 45.69 kg/m  Gen: No acute distress, pleasant, cooperative, well-appearing HEENT: Normocephalic, atraumatic Lungs: Normal effort on room air Neuro: Alert, grossly nonfocal, speech normal Ext: Right upper extremity wrapped in compressive bandage  ASSESSMENT/PLAN:   Assessment & Plan Malignant neoplasm of overlapping sites of right breast in female, estrogen receptor positive (HCC) Refer back to oncology for follow-up and surveillance Prediabetes Attempt to get GLP-1 covered for her prediabetes and obesity If we are not successful with this, she will follow-up with healthy weight and wellness Lymphedema of right arm Doing well with lymphedema treatments Routine adult health maintenance Advised HPV vaccine, she will schedule RN clinic visit to get this done. Currently has bug bite on her left shoulder so we are going to hold off on the HPV vaccine so as to not confuse a reaction from the bug bite with a reaction from the vaccine.    Karen J. Donah, MD [facility]

## 2023-11-25 ENCOUNTER — Other Ambulatory Visit: Payer: Self-pay | Admitting: Family Medicine

## 2023-11-25 ENCOUNTER — Ambulatory Visit

## 2023-11-25 DIAGNOSIS — M25611 Stiffness of right shoulder, not elsewhere classified: Secondary | ICD-10-CM | POA: Diagnosis not present

## 2023-11-25 DIAGNOSIS — Z853 Personal history of malignant neoplasm of breast: Secondary | ICD-10-CM | POA: Diagnosis not present

## 2023-11-25 DIAGNOSIS — I972 Postmastectomy lymphedema syndrome: Secondary | ICD-10-CM

## 2023-11-25 NOTE — Therapy (Signed)
 OUTPATIENT PHYSICAL THERAPY ONCOLOGY TREATMENT     Patient Name: Karen Hopkins MRN: [id number] DOB:07-26-1975, 48 y.o., female Today's Date: 11/25/2023   PT End of Session - 11/25/23 0908     Visit Number 11    Number of Visits 19    Date for PT Re-Evaluation 12/06/23    PT Start Time 0902    PT Stop Time 1005    PT Time Calculation (min) 63 min    Activity Tolerance Patient tolerated treatment well    Behavior During Therapy [facility] for tasks assessed/performed            Past Medical History:  Diagnosis Date   Anxiety    Arthritis    Cancer (HCC) 2011   Rt. Br. Ca   Depression    Diabetes mellitus without complication (HCC)    has been borderline   Essential hypertension 11/05/2015   H. pylori infection    History of radiation therapy    Hypertension    under control with med., has been on med. x 2 yr.   Lymphedema of arm    right; no BP or puncture to right arm   Personal history of chemotherapy 09/16/2009   rt breast   Pneumonia    Seasonal allergies    Sinus headache    Past Surgical History:  Procedure Laterality Date   BREAST BIOPSY Right 08/26/2009   BREAST REDUCTION SURGERY Left 08/23/2013   Procedure: LEFT BREAST REDUCTION  ;  Surgeon: Marilou Showman, DO;  Location: [facility];  Service: Plastics;  Laterality: Left;   BREAST REDUCTION WITH MASTOPEXY Left 08/22/2019   Procedure: Left breast mastopexy reduction with liposuction for breast symmetry;  Surgeon: Thornell Flirt, DO;  Location: [facility];  Service: Plastics;  Laterality: Left;   CESAREAN SECTION  07/15/2001; 03/18/2004; 11/21/2008   lateral orbiotomy Left 11/2015   Dr. Abel Abelson at [facility]   LATISSIMUS FLAP TO BREAST Right 03/12/2013   Procedure: RIGHT BREAST LATISSIMUS FLAP WITH EXPANDER PLACEMENT;  Surgeon: Marilou Showman, DO;  Location: [facility];  Service: Plastics;  Laterality: Right;   LIPOSUCTION Bilateral 08/23/2013   Procedure: LIPOSUCTION;  Surgeon: Marilou Showman, DO;  Location: [facility];  Service: Plastics;  Laterality: Bilateral;   LIPOSUCTION WITH LIPOFILLING Left 08/22/2019   Procedure: LIPOSUCTION WITH LIPOFILLING;  Surgeon: Thornell Flirt, DO;  Location: [facility];  Service: Plastics;  Laterality: Left;   MASTECTOMY Right 2011   MODIFIED RADICAL MASTECTOMY Right 03/11/2010   NASAL SEPTUM SURGERY     ORIF ANKLE FRACTURE Left 07/18/2018   Procedure: OPEN REDUCTION INTERNAL FIXATION (ORIF) LEFT ANKLE FRACTURE WITH  SYNDESMOSIS AND ANKLE ARTHROTOMY;  Surgeon: Donnamarie Gables, MD;  Location: [facility];  Service: Orthopedics;  Laterality: Left;   PORT-A-CATH REMOVAL Left 12/01/2010   PORTACATH PLACEMENT Left 09/12/2009   REDUCTION MAMMAPLASTY Left 08/2013   REMOVAL OF TISSUE EXPANDER AND PLACEMENT OF IMPLANT Right 08/23/2013   Procedure: REMOVAL RIGHT TISSUE EXPANDER AND PLACEMENT OF IMPLANT TO RIGHT BREAST ;  Surgeon: Marilou Showman, DO;  Location:  SURGERY CENTER;  Service: Plastics;  Laterality: Right;   SUPRA-UMBILICAL HERNIA  2006   TUBAL LIGATION  11/21/2008   UMBILICAL HERNIA REPAIR  04/12/2022   [facility] DR   Patient Active Problem List   Diagnosis Date Noted   Contraceptive, surveillance, intrauterine device 04/13/2023   Nephrolithiasis 10/21/2022   H. pylori infection 09/25/2022   Abdominal hernia 09/25/2022  Seasonal allergies 09/25/2022   Carpal tunnel syndrome 10/07/2021   Genetic testing 08/14/2019   Breast asymmetry following reconstructive surgery 07/13/2019   Family history of breast cancer    Family history of prostate cancer    Unintentional weight loss 05/21/2019   Lymphedema of right arm 02/27/2018   Dyspnea 09/23/2016   Eczema 08/31/2016   Essential hypertension 11/05/2015   Cervical dystonia 08/25/2015   Headache 07/09/2015   Myofascial muscle pain 04/29/2015   Adhesive capsulitis of right shoulder 04/29/2015   Lower extremity edema 10/01/2014    Malignant neoplasm of overlapping sites of right breast in female, estrogen receptor positive (HCC) 05/24/2014   Myalgia and myositis 05/17/2014   Neoplasm related pain 03/12/2014   Constipation 11/26/2013   H/O reduction mammoplasty 10/30/2013   Seizure (HCC) 10/06/2013   Status post bilateral breast implants 08/31/2013   S/P breast reconstruction, right 08/23/2013   AC joint arthropathy 07/02/2013   Routine adult health maintenance 05/29/2013   Hypokalemia 05/21/2013   Chronic pain 04/19/2013   Acquired absence of breast and absent nipple 01/30/2013   RHINOSINUSITIS, CHRONIC 04/08/2010   Morbid obesity (HCC) 08/04/2006   DEPRESSIVE DISORDER, NOS 08/04/2006    PCP: Daleen Dubs, MD  REFERRING PROVIDER: Candee Cha, MD   REFERRING DIAG: I89.0 (ICD-10-CM) - Lymphedema of right arm   THERAPY DIAG:  Postmastectomy lymphedema  Stiffness of right shoulder, not elsewhere classified  History of right breast cancer  ONSET DATE: 2011  Rationale for Evaluation and Treatment Rehabilitation  SUBJECTIVE                                                                                                                                                                                           SUBJECTIVE STATEMENT: The bandage you used helped the bandage stay better but the elbow still unraveled which causes my forearm to be loose. So I take it off at that time. It lasted until about 4 yesterday afternoon.   PERTINENT HISTORY:  Right breast cancer with mastectomy 2011 and ALND 22 nodes; reconstruction; left breast reduction; h/o right UE lymphedema and right upper quadrant pain, treated in this clinic several times before.  PAIN:  Are you having pain?No not right now  PRECAUTIONS: Other: R UE lymphedema, has metal in ankle  RED FLAGS: none  WEIGHT BEARING RESTRICTIONS No  FALLS:  Has patient fallen in last 6 months? No  LIVING ENVIRONMENT: Lives with: lives with 3  sons Lives in: House/apartment Stairs: No;  Has following equipment at home: None  OCCUPATION: on disability  LEISURE: trying to exercise by walking but has been bothered by her L  ankle pain  HAND DOMINANCE : right   PRIOR LEVEL OF FUNCTION: Independent  PATIENT GOALS to get my arm down and try to maintain it   OBJECTIVE  COGNITION:  Overall cognitive status: Within functional limits for tasks assessed    OBSERVATIONS / OTHER ASSESSMENTS: R upper arm considerable larger than left, area of swelling at posterior trunk just above band on bra  POSTURE: forward head, rounded shoulders  UPPER EXTREMITY AROM/PROM:  A/PROM RIGHT   eval  Right 11/18/23 Right  11/23/23  Shoulder extension 55    Shoulder flexion 133 142 145  Shoulder abduction 85 102 106  Shoulder internal rotation 40    Shoulder external rotation 60      (Blank rows = not tested)  A/PROM LEFT   eval  Shoulder extension 63  Shoulder flexion 175  Shoulder abduction 171  Shoulder internal rotation 64  Shoulder external rotation 95    (Blank rows = not tested)   LYMPHEDEMA ASSESSMENTS:   SURGERY TYPE/DATE: R mastectomy and ALND in 2011  NUMBER OF LYMPH NODES REMOVED: 1/20  CHEMOTHERAPY: completed  RADIATION:completed Jan 2012  HORMONE TREATMENT: anastrozole  from Nov 2016 to Dec 2019  INFECTIONS: none  LYMPHEDEMA ASSESSMENTS:   LANDMARK RIGHT  Eval on 03/16/22 10/25/23 11/11/23 11/16/23 11/23/23  10 cm proximal to olecranon process 47.1 50.7 47.2 47.4 47.1  Olecranon process 32.6 34.5 34 34.5 34.1  10 cm proximal to ulnar styloid process 23.6 26 26.4 26.5 25.4  Just proximal to ulnar styloid process 17.7 18 17.7 17.3 17.4  Across hand at thumb web space 21.4 22 20.2 20.2 20.3  At base of 2nd digit 6.1 6.2 5.8 6.1 5.8  (Blank rows = not tested)  LANDMARK LEFT  Eval on 03/16/22 10/25/23  10 cm proximal to olecranon process 43 46  Olecranon process 30 32.4  10 cm proximal to ulnar styloid process  24.4 26.1  Just proximal to ulnar styloid process 17.3 18.6  Across hand at thumb web space 20.6 22.1  At base of 2nd digit 6.4 6.4  (Blank rows = not tested)   Flowsheet Row Outpatient Rehab from 10/25/2023 in [facility]  Lymphedema Life Impact Scale Total Score 72.06 %      TODAY'S TREATMENT  11/25/23: Therapeutic Exercises  Pulleys into flex and abd x 3 mins each with VC's to decrease Rt scapular compensation Roll yellow ball up wall into flexion x 20 with cuing throughout to decrease muscle guarding as this is a chronic issue for pt due to previous breast surgeries/lat flap reconstruction After strengthening exs below had pt perform Rt lat stretch in doorway with changing trunk position for varying stretches. Pt reports feeling excellent stretch where she normally feels tightest and this felt very beneficial. Added to HEP Therapeutic Activities FreeMotion Machine for Rt UE ext with 7# and arm of machine on #4 with focus on decreasing scap compensation and engaging lateral trunk as able due to lat flap; pt performed 25-30 reps with slow intentional motions and cuing throughout to help her be aware of compensations  Then same motion with green theraband in top of door so pt can replicate this at home x 15 with continued slow and controlled motions and cuing by therapist throughout. Added these to HEP, see below Manual Therapy In supine: Short neck, 5 diaphragmatic breaths , Lt axillary and pectoral nodes, establishment of anterior inter-axillary pathway, Rt inguinal nodes and establishment of Rt axillo-inguinal pathway, then R UE working proximal  to distal, moving inner upper arm outwards and upwards, and doing both sides of forearms, then retracing all steps Compression Bandaging to Rt UE: Cocoa butter, TG soft, artiflex to antecubital fossa, fixated 1/4 foam to forearm and then upper arm with Idealbinde, Molelast to fingers 1-4, artiflex to dorsal hand/wrist,  then short stretch compression bandages as follows: 1 - 6 cm to hand/wrist with 1/2 gray foam to dorsal hand, then 1- 8 cm herring bone fashion from wrist to elbow, then 1 - 10 cm spiral with X at elbow, and then 1 - 12 cm spiral from wrist to axilla. Folded TG soft over top of bandage.    11/23/23: Pt measured for compression garments by fitter at beginning of session. Measured for custom flat knit Mediven 550 Rt UE sleeve and glove x 2 and Circaid Profile nighttime garment x 1 that she will need x 99 years Manual Therapy In supine: Short neck, superficial and deep abdominals , Lt axillary and pectoral nodes, anterior intact thorax sequence and establishment of anterior inter-axillary pathway, Rt inguinal nodes and establishment of Rt axillo-inguinal pathway, then R UE working proximal to distal, moving inner upper arm outwards and upwards, and doing both sides of forearms, then retracing all steps Compression Bandaging to Rt UE: Cocoa butter, TG soft, artiflex to antecubital fossa, fixated 1/4 foam to forearm and then upper arm with Idealbinde, Molelast to fingers 1-4, artiflex to dorsal hand/wrist, then short stretch compression bandages as follows: 1 - 6 cm to hand/wrist with 1/2 gray foam to dorsal hand, then 1- 8 cm spiral from wrist with X at antecubital fossa, then 1 - 10 cm herring bone from wrist to mid upper arm, and then 1 - 12 cm spiral from wrist to axilla. Folded TG soft over top of bandage.   11/21/23: Manual Therapy In supine: Short neck, superficial and deep abdominals , Lt axillary and pectoral nodes, anterior intact thorax sequence and establishment of anterior inter-axillary pathway, Rt inguinal nodes and establishment of Rt axillo-inguinal pathway, then R UE working proximal to distal, moving inner upper arm outwards and upwards, and doing both sides of forearms, then retracing all steps P/ROM to Rt shoulder into flex, abd and D2 with scapular depression STM to Rt lateral  trunk where pt palpably very tight and deep tissue pressurealso to her UT which was spasming during session today Compression Bandaging to Rt UE: Cocoa butter, TG soft, artiflex to antecubital fossa, fixated 1/4 foam to forearm and then upper arm with artiflex, artiflex to dorsal hand/wrist, then short stretch compression bandages as follows: 1 - 6 cm to hand/wrist with 1/2 gray foam to dorsal hand, then 1- 8 cm spiral from wrist with X at antecubital fossa, then 1 - 10 cm herring bone from wrist to mid upper arm, and then 1 - 12 cm spiral from wrist to axilla. Folded TG soft over top of bandage.   11/18/23: Manual Therapy In supine: Short neck, superficial and deep abdominals , Lt axillary and pectoral nodes, anterior intact thorax sequence and establishment of anterior inter-axillary pathway, Rt inguinal nodes and establishment of Rt axillo-inguinal pathway, then R UE working proximal to distal, moving inner upper arm outwards and upwards, and doing both sides of forearms, then retracing all steps P/ROM to Rt shoulder into flex, abd and D2 to pts tolerance. STM to Rt lateral trunk where pt palpably very tight   Compression Bandaging to Rt UE: Cocoa butter, TG soft, artiflex to antecubital fossa, fixated 1/4 foam to  forearm and then upper arm with artiflex, artiflex to dorsal hand/wrist, then short stretch compression bandages as follows: 1 - 6 cm to hand/wrist with 1/2 gray foam to dorsal hand, then 1- 8 cm spiral from wrist with X at antecubital fossa, then 1 - 10 cm herring bone from wrist to mid upper arm, and then 1 - 12 cm spiral from wrist to axilla. Folded TG soft over top of bandage.        PATIENT EDUCATION:  Education details: Rt lateral trunk/UE stretch and Rt postural strength [place] educated: Patient Education method: Explanation Education comprehension: verbalized understanding   HOME EXERCISE PROGRAM: None yet  ASSESSMENT:  CLINICAL IMPRESSION: Progressed HEP to  include stretching for Rt lateral trunk where pt palpably chronically very tight and added Rt postural strength. Then continued with CDT for Rt UE which we will cont until pt receives her new compression garments. Continued to encourage pt to try reapplying bandages in spiral fashion if they slide off within a day. Pt verbalized understanding.    OBJECTIVE IMPAIRMENTS decreased ROM and increased edema.   ACTIVITY LIMITATIONS carrying, lifting, and reach over head  PARTICIPATION LIMITATIONS: none  PERSONAL FACTORS Time since onset of injury/illness/exacerbation are also affecting patient's functional outcome.   REHAB POTENTIAL: Good  CLINICAL DECISION MAKING: Stable/uncomplicated  EVALUATION COMPLEXITY: Low  GOALS: Goals reviewed with patient? Yes  SHORT TERM GOALS: Target date: 11/15/23   Pt will demonstrate a 2 cm reduction in edema at 10 cm proximal to olecranon process.  Baseline: 50.7 cm; 11/23/23 - 47.1 (3.6 cm reduction) Goal status: MET  2.  Pt will demonstrate 110 degrees of R shoulder abduction to allow pt to reach out to the side.  Baseline: 85; 11/23/23 - 106 Goal status: MET  3.  Pt will obtain a compression bra for long term management of truncal edema especially posterior..  Baseline:  Goal status: MET  4.  Pt will demonstrate 150 degrees of R shoulder flexion to allow her to reach overhead.  Baseline: 133; 11/23/23 - 145 degrees Goal status: ONGOING   LONG TERM GOALS: Target date: 12/06/23    Pt will demonstrate a 3 cm reduction in edema at 10 cm proximal to olecranon process.  Baseline: 50.7 cm; 11/23/23 - 47.1 cm (3.6 cm reduction) Goal status: MET  2.  Pt will demonstrate 165 degrees of R shoulder abduction to allow pt to reach out to the side.  Baseline: 85 degrees; 11/23/23 - 106 degrees Goal status: ONGOING  3.  Pt will obtain appropriate day time and night time garments for long term management of R UE lymphedema.  Baseline: Needs new garments; 11/23/23  - pt was just measured today for new day and nighttime garments Goal status: PARTIALLY MET  4.  Pt will be able to independently manage her lymphedema through self MLD and compression garments.  Baseline: Pt is independent with self MLD and has been measured for compression garments. Goal status: PARTIALLY MET  5.  Pt will demonstrate 165 degrees of R shoulder flexion to allow her to reach overhead.  Baseline: 13 degrees; 11/23/23 - 145 degrees Goal status: ONGOING   PLAN: PT FREQUENCY: 3x/week  PT DURATION: 6 weeks  PLANNED INTERVENTIONS: Therapeutic exercises, Therapeutic activity, Patient/Family education, Self Care, Joint mobilization, Orthotic/Fit training, Manual lymph drainage, Compression bandaging, Vasopneumatic device, Manual therapy, and Re-evaluation  PLAN FOR NEXT SESSION:  Cont CDT for Rt UE until new garments arrive; review new HEP issued today and progress as able  Denyce Flank, PTA 11/25/2023, 1:00 PM   (Picture did not copy but they did print for pt) Lateral Trunk    Stand in doorway reaching overhead to the left and grab doorframe with Right hand and then lean left shoulder in until stretch felt along right trunk. Hold _10-20__ seconds. Do __3-5___ repetitions.   Lat Pull Down    Face anchor with knees slightly flexed and tummy tight. Palm down, pull arm down to side. Focus on slow and controlled motion. Keep elbow straight and shoulder down throughout up and down motions. Limit motion to where you can control the shoulder.  Repeat _10-20_ times per set. Do _2-3_ sets per day.  Anchor Height: Over Head   Cancer Rehab [phone]

## 2023-11-25 NOTE — Patient Instructions (Addendum)
 Lateral Trunk    Stand in doorway reaching overhead to the left and grab doorframe with Right hand and then lean left shoulder in until stretch felt along right trunk. Hold _10-20__ seconds. Do __3-5___ repetitions.   Lat Pull Down    Face anchor with knees slightly flexed and tummy tight. Palm down, pull arm down to side. Focus on slow and controlled motion. Keep elbow straight and shoulder down throughout up and down motions. Limit motion to where you can control the shoulder.  Repeat _10-20_ times per set. Do _2-3_ sets per day.  Anchor Height: Over Head   Cancer Rehab [phone]

## 2023-11-26 NOTE — Assessment & Plan Note (Signed)
 Refer back to oncology for follow-up and surveillance

## 2023-11-26 NOTE — Assessment & Plan Note (Signed)
 Advised HPV vaccine, she will schedule RN clinic visit to get this done. Currently has bug bite on her left shoulder so we are going to hold off on the HPV vaccine so as to not confuse a reaction from the bug bite with a reaction from the vaccine.

## 2023-11-26 NOTE — Assessment & Plan Note (Signed)
 Doing well with lymphedema treatments

## 2023-11-28 ENCOUNTER — Ambulatory Visit

## 2023-11-28 ENCOUNTER — Ambulatory Visit (INDEPENDENT_AMBULATORY_CARE_PROVIDER_SITE_OTHER)

## 2023-11-28 DIAGNOSIS — Z23 Encounter for immunization: Secondary | ICD-10-CM | POA: Diagnosis not present

## 2023-11-28 DIAGNOSIS — I972 Postmastectomy lymphedema syndrome: Secondary | ICD-10-CM | POA: Diagnosis not present

## 2023-11-28 DIAGNOSIS — Z853 Personal history of malignant neoplasm of breast: Secondary | ICD-10-CM | POA: Diagnosis not present

## 2023-11-28 DIAGNOSIS — M25611 Stiffness of right shoulder, not elsewhere classified: Secondary | ICD-10-CM

## 2023-11-28 NOTE — Therapy (Signed)
 OUTPATIENT PHYSICAL THERAPY ONCOLOGY TREATMENT     Patient Name: Karen Hopkins MRN: [id number] DOB:1975/06/13, 48 y.o., female Today's Date: 11/28/2023   PT End of Session - 11/28/23 1405     Visit Number 12    Number of Visits 19    Date for PT Re-Evaluation 12/06/23    PT Start Time 1402    PT Stop Time 1501    PT Time Calculation (min) 59 min    Activity Tolerance Patient tolerated treatment well    Behavior During Therapy [facility] for tasks assessed/performed            Past Medical History:  Diagnosis Date   Anxiety    Arthritis    Cancer (HCC) 2011   Rt. Br. Ca   Depression    Diabetes mellitus without complication (HCC)    has been borderline   Essential hypertension 11/05/2015   H. pylori infection    History of radiation therapy    Hypertension    under control with med., has been on med. x 2 yr.   Lymphedema of arm    right; no BP or puncture to right arm   Personal history of chemotherapy 09/16/2009   rt breast   Pneumonia    Seasonal allergies    Sinus headache    Past Surgical History:  Procedure Laterality Date   BREAST BIOPSY Right 08/26/2009   BREAST REDUCTION SURGERY Left 08/23/2013   Procedure: LEFT BREAST REDUCTION  ;  Surgeon: Estefana Reichert, DO;  Location: [facility];  Service: Plastics;  Laterality: Left;   BREAST REDUCTION WITH MASTOPEXY Left 08/22/2019   Procedure: Left breast mastopexy reduction with liposuction for breast symmetry;  Surgeon: Lowery Estefana RAMAN, DO;  Location: [facility];  Service: Plastics;  Laterality: Left;   CESAREAN SECTION  07/15/2001; 03/18/2004; 11/21/2008   lateral orbiotomy Left 11/2015   Dr. Melba at [facility]   LATISSIMUS FLAP TO BREAST Right 03/12/2013   Procedure: RIGHT BREAST LATISSIMUS FLAP WITH EXPANDER PLACEMENT;  Surgeon: Estefana Reichert, DO;  Location: [facility];  Service: Plastics;  Laterality: Right;   LIPOSUCTION Bilateral 08/23/2013   Procedure: LIPOSUCTION;  Surgeon: Estefana Reichert, DO;  Location: [facility];  Service: Plastics;  Laterality: Bilateral;   LIPOSUCTION WITH LIPOFILLING Left 08/22/2019   Procedure: LIPOSUCTION WITH LIPOFILLING;  Surgeon: Lowery Estefana RAMAN, DO;  Location: [facility];  Service: Plastics;  Laterality: Left;   MASTECTOMY Right 2011   MODIFIED RADICAL MASTECTOMY Right 03/11/2010   NASAL SEPTUM SURGERY     ORIF ANKLE FRACTURE Left 07/18/2018   Procedure: OPEN REDUCTION INTERNAL FIXATION (ORIF) LEFT ANKLE FRACTURE WITH  SYNDESMOSIS AND ANKLE ARTHROTOMY;  Surgeon: Elsa Lonni SAUNDERS, MD;  Location: [facility];  Service: Orthopedics;  Laterality: Left;   PORT-A-CATH REMOVAL Left 12/01/2010   PORTACATH PLACEMENT Left 09/12/2009   REDUCTION MAMMAPLASTY Left 08/2013   REMOVAL OF TISSUE EXPANDER AND PLACEMENT OF IMPLANT Right 08/23/2013   Procedure: REMOVAL RIGHT TISSUE EXPANDER AND PLACEMENT OF IMPLANT TO RIGHT BREAST ;  Surgeon: Estefana Reichert, DO;  Location:  SURGERY CENTER;  Service: Plastics;  Laterality: Right;   SUPRA-UMBILICAL HERNIA  2006   TUBAL LIGATION  11/21/2008   UMBILICAL HERNIA REPAIR  04/12/2022   CCS DR   Patient Active Problem List   Diagnosis Date Noted   Contraceptive, surveillance, intrauterine device 04/13/2023   Nephrolithiasis 10/21/2022   H. pylori infection 09/25/2022   Abdominal hernia 09/25/2022  Seasonal allergies 09/25/2022   Carpal tunnel syndrome 10/07/2021   Genetic testing 08/14/2019   Breast asymmetry following reconstructive surgery 07/13/2019   Family history of breast cancer    Family history of prostate cancer    Unintentional weight loss 05/21/2019   Lymphedema of right arm 02/27/2018   Dyspnea 09/23/2016   Eczema 08/31/2016   Essential hypertension 11/05/2015   Cervical dystonia 08/25/2015   Headache 07/09/2015   Myofascial muscle pain 04/29/2015   Adhesive capsulitis of right shoulder 04/29/2015   Lower extremity edema 10/01/2014    Malignant neoplasm of overlapping sites of right breast in female, estrogen receptor positive (HCC) 05/24/2014   Myalgia and myositis 05/17/2014   Neoplasm related pain 03/12/2014   Constipation 11/26/2013   H/O reduction mammoplasty 10/30/2013   Seizure (HCC) 10/06/2013   Status post bilateral breast implants 08/31/2013   S/P breast reconstruction, right 08/23/2013   AC joint arthropathy 07/02/2013   Routine adult health maintenance 05/29/2013   Hypokalemia 05/21/2013   Chronic pain 04/19/2013   Acquired absence of breast and absent nipple 01/30/2013   RHINOSINUSITIS, CHRONIC 04/08/2010   Morbid obesity (HCC) 08/04/2006   DEPRESSIVE DISORDER, NOS 08/04/2006    PCP: Karen Legions, MD  REFERRING PROVIDER: Legions Karen PARAS, MD   REFERRING DIAG: I89.0 (ICD-10-CM) - Lymphedema of right arm   THERAPY DIAG:  Postmastectomy lymphedema  Stiffness of right shoulder, not elsewhere classified  History of right breast cancer  ONSET DATE: 2011  Rationale for Evaluation and Treatment Rehabilitation  SUBJECTIVE                                                                                                                                                                                           SUBJECTIVE STATEMENT: I've been doing the new exercises with the theraband you gave me last week. I want you to check it because I'm not sure if I'm doing it right.   PERTINENT HISTORY:  Right breast cancer with mastectomy 2011 and ALND 22 nodes; reconstruction; left breast reduction; h/o right UE lymphedema and right upper quadrant pain, treated in this clinic several times before.  PAIN:  Are you having pain?No not right now  PRECAUTIONS: Other: R UE lymphedema, has metal in ankle  RED FLAGS: none  WEIGHT BEARING RESTRICTIONS No  FALLS:  Has patient fallen in last 6 months? No  LIVING ENVIRONMENT: Lives with: lives with 3 sons Lives in: House/apartment Stairs: No;  Has  following equipment at home: None  OCCUPATION: on disability  LEISURE: trying to exercise by walking but has been bothered by her L ankle pain  HAND DOMINANCE : right  PRIOR LEVEL OF FUNCTION: Independent  PATIENT GOALS to get my arm down and try to maintain it   OBJECTIVE  COGNITION:  Overall cognitive status: Within functional limits for tasks assessed    OBSERVATIONS / OTHER ASSESSMENTS: R upper arm considerable larger than left, area of swelling at posterior trunk just above band on bra  POSTURE: forward head, rounded shoulders  UPPER EXTREMITY AROM/PROM:  A/PROM RIGHT   eval  Right 11/18/23 Right  11/23/23  Shoulder extension 55    Shoulder flexion 133 142 145  Shoulder abduction 85 102 106  Shoulder internal rotation 40    Shoulder external rotation 60      (Blank rows = not tested)  A/PROM LEFT   eval  Shoulder extension 63  Shoulder flexion 175  Shoulder abduction 171  Shoulder internal rotation 64  Shoulder external rotation 95    (Blank rows = not tested)   LYMPHEDEMA ASSESSMENTS:   SURGERY TYPE/DATE: R mastectomy and ALND in 2011  NUMBER OF LYMPH NODES REMOVED: 1/20  CHEMOTHERAPY: completed  RADIATION:completed Jan 2012  HORMONE TREATMENT: anastrozole  from Nov 2016 to Dec 2019  INFECTIONS: none  LYMPHEDEMA ASSESSMENTS:   LANDMARK RIGHT  Eval on 03/16/22 10/25/23 11/11/23 11/16/23 11/23/23  10 cm proximal to olecranon process 47.1 50.7 47.2 47.4 47.1  Olecranon process 32.6 34.5 34 34.5 34.1  10 cm proximal to ulnar styloid process 23.6 26 26.4 26.5 25.4  Just proximal to ulnar styloid process 17.7 18 17.7 17.3 17.4  Across hand at thumb web space 21.4 22 20.2 20.2 20.3  At base of 2nd digit 6.1 6.2 5.8 6.1 5.8  (Blank rows = not tested)  LANDMARK LEFT  Eval on 03/16/22 10/25/23  10 cm proximal to olecranon process 43 46  Olecranon process 30 32.4  10 cm proximal to ulnar styloid process 24.4 26.1  Just proximal to ulnar styloid process  17.3 18.6  Across hand at thumb web space 20.6 22.1  At base of 2nd digit 6.4 6.4  (Blank rows = not tested)   Flowsheet Row Outpatient Rehab from 10/25/2023 in [facility]  Lymphedema Life Impact Scale Total Score 72.06 %      TODAY'S TREATMENT  11/28/23: Therapeutic Exercises  Pulleys into flex and abd x 3 mins each with VC's to decrease Rt scapular compensation Roll yellow ball up wall into flexion x 10 with cuing initially to remind pt of correct technique and then able to return correct demo Therapeutic Activities FreeMotion Machine for Rt UE ext with 7# and arm of machine on #4 with focus on decreasing scap compensation and engaging lateral trunk as able due to lat flap; spent time reviewing correct technique and muscle engagement Manual Therapy In supine: Short neck, 5 diaphragmatic breaths , Lt axillary and pectoral nodes, establishment of anterior inter-axillary pathway, Rt inguinal nodes and establishment of Rt axillo-inguinal pathway, then R UE working proximal to distal, moving inner upper arm outwards and upwards, and doing both sides of forearms, then retracing all steps P/ROM to Rt shoulder into flex, abd and D2 with scapular depression throughout by therapist STM to Rt lateral trunk where pt maximally tight  Compression Bandaging to Rt UE: Cocoa butter, TG soft, artiflex to antecubital fossa, fixated 1/4 foam to forearm and then upper arm with Idealbinde, Molelast to fingers 1-4, artiflex to dorsal hand/wrist, then short stretch compression bandages as follows: 1 - 6 cm to hand/wrist with 1/2 gray foam to dorsal hand, then 1- 8  cm herring bone fashion from wrist to elbow, then 1 - 10 cm spiral with X at elbow, and then 1 - 12 cm spiral from wrist to axilla. Folded TG soft over top of bandage.   11/25/23: Therapeutic Exercises  Pulleys into flex and abd x 3 mins each with VC's to decrease Rt scapular compensation Roll yellow ball up wall into  flexion x 20 with cuing throughout to decrease muscle guarding as this is a chronic issue for pt due to previous breast surgeries/lat flap reconstruction After strengthening exs below had pt perform Rt lat stretch in doorway with changing trunk position for varying stretches. Pt reports feeling excellent stretch where she normally feels tightest and this felt very beneficial. Added to HEP Therapeutic Activities FreeMotion Machine for Rt UE ext with 7# and arm of machine on #4 with focus on decreasing scap compensation and engaging lateral trunk as able due to lat flap; pt performed 25-30 reps with slow intentional motions and cuing throughout to help her be aware of compensations  Then same motion with green theraband in top of door so pt can replicate this at home x 15 with continued slow and controlled motions and cuing by therapist throughout. Added these to HEP, see below Manual Therapy In supine: Short neck, 5 diaphragmatic breaths , Lt axillary and pectoral nodes, establishment of anterior inter-axillary pathway, Rt inguinal nodes and establishment of Rt axillo-inguinal pathway, then R UE working proximal to distal, moving inner upper arm outwards and upwards, and doing both sides of forearms, then retracing all steps Compression Bandaging to Rt UE: Cocoa butter, TG soft, artiflex to antecubital fossa, fixated 1/4 foam to forearm and then upper arm with Idealbinde, Molelast to fingers 1-4, artiflex to dorsal hand/wrist, then short stretch compression bandages as follows: 1 - 6 cm to hand/wrist with 1/2 gray foam to dorsal hand, then 1- 8 cm herring bone fashion from wrist to elbow, then 1 - 10 cm spiral with X at elbow, and then 1 - 12 cm spiral from wrist to axilla. Folded TG soft over top of bandage.    11/23/23: Pt measured for compression garments by fitter at beginning of session. Measured for custom flat knit Mediven 550 Rt UE sleeve and glove x 2 and Circaid Profile nighttime garment x 1  that she will need x 99 years Manual Therapy In supine: Short neck, superficial and deep abdominals , Lt axillary and pectoral nodes, anterior intact thorax sequence and establishment of anterior inter-axillary pathway, Rt inguinal nodes and establishment of Rt axillo-inguinal pathway, then R UE working proximal to distal, moving inner upper arm outwards and upwards, and doing both sides of forearms, then retracing all steps Compression Bandaging to Rt UE: Cocoa butter, TG soft, artiflex to antecubital fossa, fixated 1/4 foam to forearm and then upper arm with Idealbinde, Molelast to fingers 1-4, artiflex to dorsal hand/wrist, then short stretch compression bandages as follows: 1 - 6 cm to hand/wrist with 1/2 gray foam to dorsal hand, then 1- 8 cm spiral from wrist with X at antecubital fossa, then 1 - 10 cm herring bone from wrist to mid upper arm, and then 1 - 12 cm spiral from wrist to axilla. Folded TG soft over top of bandage.   11/21/23: Manual Therapy In supine: Short neck, superficial and deep abdominals , Lt axillary and pectoral nodes, anterior intact thorax sequence and establishment of anterior inter-axillary pathway, Rt inguinal nodes and establishment of Rt axillo-inguinal pathway, then R UE working proximal to  distal, moving inner upper arm outwards and upwards, and doing both sides of forearms, then retracing all steps P/ROM to Rt shoulder into flex, abd and D2 with scapular depression STM to Rt lateral trunk where pt palpably very tight and deep tissue pressurealso to her UT which was spasming during session today Compression Bandaging to Rt UE: Cocoa butter, TG soft, artiflex to antecubital fossa, fixated 1/4 foam to forearm and then upper arm with artiflex, artiflex to dorsal hand/wrist, then short stretch compression bandages as follows: 1 - 6 cm to hand/wrist with 1/2 gray foam to dorsal hand, then 1- 8 cm spiral from wrist with X at antecubital fossa, then 1 - 10 cm herring  bone from wrist to mid upper arm, and then 1 - 12 cm spiral from wrist to axilla. Folded TG soft over top of bandage.        PATIENT EDUCATION:  Education details: Rt lateral trunk/UE stretch and Rt postural strength [place] educated: Patient Education method: Explanation Education comprehension: verbalized understanding   HOME EXERCISE PROGRAM: None yet  ASSESSMENT:  CLINICAL IMPRESSION: Continued and reviewed Rt UE ext assessing technique. Pt does still struggle with scapular depression though is able to recognize and mostly self correct with min VC's. Then continued with CDT of Rt UE. Pt called Dr. Donah during MLD for script for sleeve to get signed but no answer so gave her a copy of the script as she repors she will go by there to get it signed.    OBJECTIVE IMPAIRMENTS decreased ROM and increased edema.   ACTIVITY LIMITATIONS carrying, lifting, and reach over head  PARTICIPATION LIMITATIONS: none  PERSONAL FACTORS Time since onset of injury/illness/exacerbation are also affecting patient's functional outcome.   REHAB POTENTIAL: Good  CLINICAL DECISION MAKING: Stable/uncomplicated  EVALUATION COMPLEXITY: Low  GOALS: Goals reviewed with patient? Yes  SHORT TERM GOALS: Target date: 11/15/23   Pt will demonstrate a 2 cm reduction in edema at 10 cm proximal to olecranon process.  Baseline: 50.7 cm; 11/23/23 - 47.1 (3.6 cm reduction) Goal status: MET  2.  Pt will demonstrate 110 degrees of R shoulder abduction to allow pt to reach out to the side.  Baseline: 85; 11/23/23 - 106 Goal status: MET  3.  Pt will obtain a compression bra for long term management of truncal edema especially posterior..  Baseline:  Goal status: MET  4.  Pt will demonstrate 150 degrees of R shoulder flexion to allow her to reach overhead.  Baseline: 133; 11/23/23 - 145 degrees Goal status: ONGOING   LONG TERM GOALS: Target date: 12/06/23    Pt will demonstrate a 3 cm reduction in edema  at 10 cm proximal to olecranon process.  Baseline: 50.7 cm; 11/23/23 - 47.1 cm (3.6 cm reduction) Goal status: MET  2.  Pt will demonstrate 165 degrees of R shoulder abduction to allow pt to reach out to the side.  Baseline: 85 degrees; 11/23/23 - 106 degrees Goal status: ONGOING  3.  Pt will obtain appropriate day time and night time garments for long term management of R UE lymphedema.  Baseline: Needs new garments; 11/23/23 - pt was just measured today for new day and nighttime garments Goal status: PARTIALLY MET  4.  Pt will be able to independently manage her lymphedema through self MLD and compression garments.  Baseline: Pt is independent with self MLD and has been measured for compression garments. Goal status: PARTIALLY MET  5.  Pt will demonstrate 165 degrees of R  shoulder flexion to allow her to reach overhead.  Baseline: 13 degrees; 11/23/23 - 145 degrees Goal status: ONGOING   PLAN: PT FREQUENCY: 3x/week  PT DURATION: 6 weeks  PLANNED INTERVENTIONS: Therapeutic exercises, Therapeutic activity, Patient/Family education, Self Care, Joint mobilization, Orthotic/Fit training, Manual lymph drainage, Compression bandaging, Vasopneumatic device, Manual therapy, and Re-evaluation  PLAN FOR NEXT SESSION:  Cont CDT for Rt UE until new garments arrive; review new HEP issued today and progress as able   Aden Berwyn Caldron, PTA 11/28/2023, 4:31 PM   (Picture did not copy but they did print for pt) Lateral Trunk    Stand in doorway reaching overhead to the left and grab doorframe with Right hand and then lean left shoulder in until stretch felt along right trunk. Hold _10-20__ seconds. Do __3-5___ repetitions.   Lat Pull Down    Face anchor with knees slightly flexed and tummy tight. Palm down, pull arm down to side. Focus on slow and controlled motion. Keep elbow straight and shoulder down throughout up and down motions. Limit motion to where you can control the  shoulder.  Repeat _10-20_ times per set. Do _2-3_ sets per day.  Anchor Height: Over Head   Cancer Rehab [phone]

## 2023-11-28 NOTE — Progress Notes (Signed)
 Patient presents to nurse clinic for HPV vaccination. Patient denies previous history of allergic reaction to vaccines and denies fever in the last 24 hours.   Administered in LD, site unremarkable, tolerated injection well. Recommended date for next injection will be 12/26/23.  Scheduled patient for next HPV vaccination on 12/29/23.   Chiquita JAYSON English, RN

## 2023-11-30 ENCOUNTER — Encounter: Payer: Self-pay | Admitting: Physical Therapy

## 2023-11-30 ENCOUNTER — Ambulatory Visit: Admitting: Physical Therapy

## 2023-11-30 ENCOUNTER — Other Ambulatory Visit (HOSPITAL_COMMUNITY): Payer: Self-pay

## 2023-11-30 DIAGNOSIS — Z853 Personal history of malignant neoplasm of breast: Secondary | ICD-10-CM | POA: Diagnosis not present

## 2023-11-30 DIAGNOSIS — I972 Postmastectomy lymphedema syndrome: Secondary | ICD-10-CM

## 2023-11-30 DIAGNOSIS — M25611 Stiffness of right shoulder, not elsewhere classified: Secondary | ICD-10-CM | POA: Diagnosis not present

## 2023-11-30 NOTE — Therapy (Signed)
 OUTPATIENT PHYSICAL THERAPY ONCOLOGY TREATMENT     Patient Name: Karen Hopkins MRN: [id number] DOB:06-Nov-1975, 48 y.o., female Today's Date: 11/30/2023   PT End of Session - 11/30/23 1101     Visit Number 13    Number of Visits 19    Date for PT Re-Evaluation 12/06/23    PT Start Time 1002    PT Stop Time 1058    PT Time Calculation (min) 56 min    Activity Tolerance Patient tolerated treatment well    Behavior During Therapy [facility] for tasks assessed/performed             Past Medical History:  Diagnosis Date   Anxiety    Arthritis    Cancer (HCC) 2011   Rt. Br. Ca   Depression    Diabetes mellitus without complication (HCC)    has been borderline   Essential hypertension 11/05/2015   H. pylori infection    History of radiation therapy    Hypertension    under control with med., has been on med. x 2 yr.   Lymphedema of arm    right; no BP or puncture to right arm   Personal history of chemotherapy 09/16/2009   rt breast   Pneumonia    Seasonal allergies    Sinus headache    Past Surgical History:  Procedure Laterality Date   BREAST BIOPSY Right 08/26/2009   BREAST REDUCTION SURGERY Left 08/23/2013   Procedure: LEFT BREAST REDUCTION  ;  Surgeon: Estefana Reichert, DO;  Location: [facility];  Service: Plastics;  Laterality: Left;   BREAST REDUCTION WITH MASTOPEXY Left 08/22/2019   Procedure: Left breast mastopexy reduction with liposuction for breast symmetry;  Surgeon: Lowery Estefana RAMAN, DO;  Location: [facility];  Service: Plastics;  Laterality: Left;   CESAREAN SECTION  07/15/2001; 03/18/2004; 11/21/2008   lateral orbiotomy Left 11/2015   Dr. Melba at [facility]   LATISSIMUS FLAP TO BREAST Right 03/12/2013   Procedure: RIGHT BREAST LATISSIMUS FLAP WITH EXPANDER PLACEMENT;  Surgeon: Estefana Reichert, DO;  Location: [facility];  Service: Plastics;  Laterality: Right;   LIPOSUCTION Bilateral 08/23/2013   Procedure: LIPOSUCTION;  Surgeon: Estefana Reichert, DO;  Location: [facility];  Service: Plastics;  Laterality: Bilateral;   LIPOSUCTION WITH LIPOFILLING Left 08/22/2019   Procedure: LIPOSUCTION WITH LIPOFILLING;  Surgeon: Lowery Estefana RAMAN, DO;  Location: [facility];  Service: Plastics;  Laterality: Left;   MASTECTOMY Right 2011   MODIFIED RADICAL MASTECTOMY Right 03/11/2010   NASAL SEPTUM SURGERY     ORIF ANKLE FRACTURE Left 07/18/2018   Procedure: OPEN REDUCTION INTERNAL FIXATION (ORIF) LEFT ANKLE FRACTURE WITH  SYNDESMOSIS AND ANKLE ARTHROTOMY;  Surgeon: Elsa Lonni SAUNDERS, MD;  Location: [facility];  Service: Orthopedics;  Laterality: Left;   PORT-A-CATH REMOVAL Left 12/01/2010   PORTACATH PLACEMENT Left 09/12/2009   REDUCTION MAMMAPLASTY Left 08/2013   REMOVAL OF TISSUE EXPANDER AND PLACEMENT OF IMPLANT Right 08/23/2013   Procedure: REMOVAL RIGHT TISSUE EXPANDER AND PLACEMENT OF IMPLANT TO RIGHT BREAST ;  Surgeon: Estefana Reichert, DO;  Location: [facility];  Service: Plastics;  Laterality: Right;   SUPRA-UMBILICAL HERNIA  2006   TUBAL LIGATION  11/21/2008   UMBILICAL HERNIA REPAIR  04/12/2022   CCS DR   Patient Active Problem List   Diagnosis Date Noted   Contraceptive, surveillance, intrauterine device 04/13/2023   Nephrolithiasis 10/21/2022   H. pylori infection 09/25/2022   Abdominal hernia 09/25/2022  Seasonal allergies 09/25/2022   Carpal tunnel syndrome 10/07/2021   Genetic testing 08/14/2019   Breast asymmetry following reconstructive surgery 07/13/2019   Family history of breast cancer    Family history of prostate cancer    Unintentional weight loss 05/21/2019   Lymphedema of right arm 02/27/2018   Dyspnea 09/23/2016   Eczema 08/31/2016   Essential hypertension 11/05/2015   Cervical dystonia 08/25/2015   Headache 07/09/2015   Myofascial muscle pain 04/29/2015   Adhesive capsulitis of right shoulder 04/29/2015   Lower extremity edema 10/01/2014    Malignant neoplasm of overlapping sites of right breast in female, estrogen receptor positive (HCC) 05/24/2014   Myalgia and myositis 05/17/2014   Neoplasm related pain 03/12/2014   Constipation 11/26/2013   H/O reduction mammoplasty 10/30/2013   Seizure (HCC) 10/06/2013   Status post bilateral breast implants 08/31/2013   S/P breast reconstruction, right 08/23/2013   AC joint arthropathy 07/02/2013   Routine adult health maintenance 05/29/2013   Hypokalemia 05/21/2013   Chronic pain 04/19/2013   Acquired absence of breast and absent nipple 01/30/2013   RHINOSINUSITIS, CHRONIC 04/08/2010   Morbid obesity (HCC) 08/04/2006   DEPRESSIVE DISORDER, NOS 08/04/2006    PCP: Laymon Legions, MD  REFERRING PROVIDER: Legions Laymon PARAS, MD   REFERRING DIAG: I89.0 (ICD-10-CM) - Lymphedema of right arm   THERAPY DIAG:  Postmastectomy lymphedema  Stiffness of right shoulder, not elsewhere classified  History of right breast cancer  ONSET DATE: 2011  Rationale for Evaluation and Treatment Rehabilitation  SUBJECTIVE                                                                                                                                                                                           SUBJECTIVE STATEMENT: I am sore from stretching and doing the exercises.   PERTINENT HISTORY:  Right breast cancer with mastectomy 2011 and ALND 22 nodes; reconstruction; left breast reduction; h/o right UE lymphedema and right upper quadrant pain, treated in this clinic several times before.  PAIN:  Are you having pain?No not right now  PRECAUTIONS: Other: R UE lymphedema, has metal in ankle  RED FLAGS: none  WEIGHT BEARING RESTRICTIONS No  FALLS:  Has patient fallen in last 6 months? No  LIVING ENVIRONMENT: Lives with: lives with 3 sons Lives in: House/apartment Stairs: No;  Has following equipment at home: None  OCCUPATION: on disability  LEISURE: trying to exercise  by walking but has been bothered by her L ankle pain  HAND DOMINANCE : right   PRIOR LEVEL OF FUNCTION: Independent  PATIENT GOALS to get my arm down and try to maintain it  OBJECTIVE  COGNITION:  Overall cognitive status: Within functional limits for tasks assessed    OBSERVATIONS / OTHER ASSESSMENTS: R upper arm considerable larger than left, area of swelling at posterior trunk just above band on bra  POSTURE: forward head, rounded shoulders  UPPER EXTREMITY AROM/PROM:  A/PROM RIGHT   eval  Right 11/18/23 Right  11/23/23  Shoulder extension 55    Shoulder flexion 133 142 145  Shoulder abduction 85 102 106  Shoulder internal rotation 40    Shoulder external rotation 60      (Blank rows = not tested)  A/PROM LEFT   eval  Shoulder extension 63  Shoulder flexion 175  Shoulder abduction 171  Shoulder internal rotation 64  Shoulder external rotation 95    (Blank rows = not tested)   LYMPHEDEMA ASSESSMENTS:   SURGERY TYPE/DATE: R mastectomy and ALND in 2011  NUMBER OF LYMPH NODES REMOVED: 1/20  CHEMOTHERAPY: completed  RADIATION:completed Jan 2012  HORMONE TREATMENT: anastrozole  from Nov 2016 to Dec 2019  INFECTIONS: none  LYMPHEDEMA ASSESSMENTS:   LANDMARK RIGHT  Eval on 03/16/22 10/25/23 11/11/23 11/16/23 11/23/23  10 cm proximal to olecranon process 47.1 50.7 47.2 47.4 47.1  Olecranon process 32.6 34.5 34 34.5 34.1  10 cm proximal to ulnar styloid process 23.6 26 26.4 26.5 25.4  Just proximal to ulnar styloid process 17.7 18 17.7 17.3 17.4  Across hand at thumb web space 21.4 22 20.2 20.2 20.3  At base of 2nd digit 6.1 6.2 5.8 6.1 5.8  (Blank rows = not tested)  LANDMARK LEFT  Eval on 03/16/22 10/25/23  10 cm proximal to olecranon process 43 46  Olecranon process 30 32.4  10 cm proximal to ulnar styloid process 24.4 26.1  Just proximal to ulnar styloid process 17.3 18.6  Across hand at thumb web space 20.6 22.1  At base of 2nd digit 6.4 6.4  (Blank  rows = not tested)   Flowsheet Row Outpatient Rehab from 10/25/2023 in [facility]  Lymphedema Life Impact Scale Total Score 72.06 %      TODAY'S TREATMENT  11/30/23: Therapeutic Exercises  Pulleys into flex and abd x 3 mins each with VC's to decrease Rt scapular compensation Roll yellow ball up wall into flexion x 10  Therapeutic Activities FreeMotion Machine for Rt UE ext with 7# and arm of machine on #4 with focus on decreasing scap compensation and engaging lateral trunk as able due to lat flap Manual Therapy In supine: Short neck, 5 diaphragmatic breaths , Lt axillary and pectoral nodes, establishment of anterior inter-axillary pathway, Rt inguinal nodes and establishment of Rt axillo-inguinal pathway, then R UE working proximal to distal, moving inner upper arm outwards and upwards, and doing both sides of forearms, then retracing all steps Compression Bandaging to Rt UE: Cocoa butter, TG soft, artiflex to antecubital fossa, fixated 1/4 foam to forearm and then upper arm with Idealbinde, Molelast to fingers 1-4, artiflex to dorsal hand/wrist, then short stretch compression bandages as follows: 1 - 6 cm to hand/wrist with 1/2 gray foam to dorsal hand, then 1- 8 cm herring bone fashion from wrist to elbow, then 1 - 10 cm spiral with X at elbow, and then 1 - 12 cm spiral from wrist to axilla. Folded TG soft over top of bandage.   11/28/23: Therapeutic Exercises  Pulleys into flex and abd x 3 mins each with VC's to decrease Rt scapular compensation Roll yellow ball up wall into flexion x 10 with  cuing initially to remind pt of correct technique and then able to return correct demo Therapeutic Activities FreeMotion Machine for Rt UE ext with 7# and arm of machine on #4 with focus on decreasing scap compensation and engaging lateral trunk as able due to lat flap; spent time reviewing correct technique and muscle engagement Manual Therapy In supine: Short neck, 5  diaphragmatic breaths , Lt axillary and pectoral nodes, establishment of anterior inter-axillary pathway, Rt inguinal nodes and establishment of Rt axillo-inguinal pathway, then R UE working proximal to distal, moving inner upper arm outwards and upwards, and doing both sides of forearms, then retracing all steps P/ROM to Rt shoulder into flex, abd and D2 with scapular depression throughout by therapist STM to Rt lateral trunk where pt maximally tight  Compression Bandaging to Rt UE: Cocoa butter, TG soft, artiflex to antecubital fossa, fixated 1/4 foam to forearm and then upper arm with Idealbinde, Molelast to fingers 1-4, artiflex to dorsal hand/wrist, then short stretch compression bandages as follows: 1 - 6 cm to hand/wrist with 1/2 gray foam to dorsal hand, then 1- 8 cm herring bone fashion from wrist to elbow, then 1 - 10 cm spiral with X at elbow, and then 1 - 12 cm spiral from wrist to axilla. Folded TG soft over top of bandage.   11/25/23: Therapeutic Exercises  Pulleys into flex and abd x 3 mins each with VC's to decrease Rt scapular compensation Roll yellow ball up wall into flexion x 20 with cuing throughout to decrease muscle guarding as this is a chronic issue for pt due to previous breast surgeries/lat flap reconstruction After strengthening exs below had pt perform Rt lat stretch in doorway with changing trunk position for varying stretches. Pt reports feeling excellent stretch where she normally feels tightest and this felt very beneficial. Added to HEP Therapeutic Activities FreeMotion Machine for Rt UE ext with 7# and arm of machine on #4 with focus on decreasing scap compensation and engaging lateral trunk as able due to lat flap; pt performed 25-30 reps with slow intentional motions and cuing throughout to help her be aware of compensations  Then same motion with green theraband in top of door so pt can replicate this at home x 15 with continued slow and controlled motions and  cuing by therapist throughout. Added these to HEP, see below Manual Therapy In supine: Short neck, 5 diaphragmatic breaths , Lt axillary and pectoral nodes, establishment of anterior inter-axillary pathway, Rt inguinal nodes and establishment of Rt axillo-inguinal pathway, then R UE working proximal to distal, moving inner upper arm outwards and upwards, and doing both sides of forearms, then retracing all steps Compression Bandaging to Rt UE: Cocoa butter, TG soft, artiflex to antecubital fossa, fixated 1/4 foam to forearm and then upper arm with Idealbinde, Molelast to fingers 1-4, artiflex to dorsal hand/wrist, then short stretch compression bandages as follows: 1 - 6 cm to hand/wrist with 1/2 gray foam to dorsal hand, then 1- 8 cm herring bone fashion from wrist to elbow, then 1 - 10 cm spiral with X at elbow, and then 1 - 12 cm spiral from wrist to axilla. Folded TG soft over top of bandage.    11/23/23: Pt measured for compression garments by fitter at beginning of session. Measured for custom flat knit Mediven 550 Rt UE sleeve and glove x 2 and Circaid Profile nighttime garment x 1 that she will need x 99 years Manual Therapy In supine: Short neck, superficial and deep abdominals ,  Lt axillary and pectoral nodes, anterior intact thorax sequence and establishment of anterior inter-axillary pathway, Rt inguinal nodes and establishment of Rt axillo-inguinal pathway, then R UE working proximal to distal, moving inner upper arm outwards and upwards, and doing both sides of forearms, then retracing all steps Compression Bandaging to Rt UE: Cocoa butter, TG soft, artiflex to antecubital fossa, fixated 1/4 foam to forearm and then upper arm with Idealbinde, Molelast to fingers 1-4, artiflex to dorsal hand/wrist, then short stretch compression bandages as follows: 1 - 6 cm to hand/wrist with 1/2 gray foam to dorsal hand, then 1- 8 cm spiral from wrist with X at antecubital fossa, then 1 - 10 cm  herring bone from wrist to mid upper arm, and then 1 - 12 cm spiral from wrist to axilla. Folded TG soft over top of bandage.   11/21/23: Manual Therapy In supine: Short neck, superficial and deep abdominals , Lt axillary and pectoral nodes, anterior intact thorax sequence and establishment of anterior inter-axillary pathway, Rt inguinal nodes and establishment of Rt axillo-inguinal pathway, then R UE working proximal to distal, moving inner upper arm outwards and upwards, and doing both sides of forearms, then retracing all steps P/ROM to Rt shoulder into flex, abd and D2 with scapular depression STM to Rt lateral trunk where pt palpably very tight and deep tissue pressurealso to her UT which was spasming during session today Compression Bandaging to Rt UE: Cocoa butter, TG soft, artiflex to antecubital fossa, fixated 1/4 foam to forearm and then upper arm with artiflex, artiflex to dorsal hand/wrist, then short stretch compression bandages as follows: 1 - 6 cm to hand/wrist with 1/2 gray foam to dorsal hand, then 1- 8 cm spiral from wrist with X at antecubital fossa, then 1 - 10 cm herring bone from wrist to mid upper arm, and then 1 - 12 cm spiral from wrist to axilla. Folded TG soft over top of bandage.        PATIENT EDUCATION:  Education details: Rt lateral trunk/UE stretch and Rt postural strength Gerber educated: Patient Education method: Explanation Education comprehension: verbalized understanding   HOME EXERCISE PROGRAM: None yet  ASSESSMENT:  CLINICAL IMPRESSION: Pt is demonstrating improved awareness of scapular compensation during exercises and able to avoid. She has been compliant with her HEP. Continued with MLD and compression bandaging today.    OBJECTIVE IMPAIRMENTS decreased ROM and increased edema.   ACTIVITY LIMITATIONS carrying, lifting, and reach over head  PARTICIPATION LIMITATIONS: none  PERSONAL FACTORS Time since onset of injury/illness/exacerbation  are also affecting patient's functional outcome.   REHAB POTENTIAL: Good  CLINICAL DECISION MAKING: Stable/uncomplicated  EVALUATION COMPLEXITY: Low  GOALS: Goals reviewed with patient? Yes  SHORT TERM GOALS: Target date: 11/15/23   Pt will demonstrate a 2 cm reduction in edema at 10 cm proximal to olecranon process.  Baseline: 50.7 cm; 11/23/23 - 47.1 (3.6 cm reduction) Goal status: MET  2.  Pt will demonstrate 110 degrees of R shoulder abduction to allow pt to reach out to the side.  Baseline: 85; 11/23/23 - 106 Goal status: MET  3.  Pt will obtain a compression bra for long term management of truncal edema especially posterior..  Baseline:  Goal status: MET  4.  Pt will demonstrate 150 degrees of R shoulder flexion to allow her to reach overhead.  Baseline: 133; 11/23/23 - 145 degrees Goal status: ONGOING   LONG TERM GOALS: Target date: 12/06/23    Pt will demonstrate a 3 cm reduction  in edema at 10 cm proximal to olecranon process.  Baseline: 50.7 cm; 11/23/23 - 47.1 cm (3.6 cm reduction) Goal status: MET  2.  Pt will demonstrate 165 degrees of R shoulder abduction to allow pt to reach out to the side.  Baseline: 85 degrees; 11/23/23 - 106 degrees Goal status: ONGOING  3.  Pt will obtain appropriate day time and night time garments for long term management of R UE lymphedema.  Baseline: Needs new garments; 11/23/23 - pt was just measured today for new day and nighttime garments Goal status: PARTIALLY MET  4.  Pt will be able to independently manage her lymphedema through self MLD and compression garments.  Baseline: Pt is independent with self MLD and has been measured for compression garments. Goal status: PARTIALLY MET  5.  Pt will demonstrate 165 degrees of R shoulder flexion to allow her to reach overhead.  Baseline: 13 degrees; 11/23/23 - 145 degrees Goal status: ONGOING   PLAN: PT FREQUENCY: 3x/week  PT DURATION: 6 weeks  PLANNED INTERVENTIONS: Therapeutic  exercises, Therapeutic activity, Patient/Family education, Self Care, Joint mobilization, Orthotic/Fit training, Manual lymph drainage, Compression bandaging, Vasopneumatic device, Manual therapy, and Re-evaluation  PLAN FOR NEXT SESSION:  Cont CDT for Rt UE until new garments arrive; review new HEP issued today and progress as able   [employer], PT 11/30/2023, 11:03 AM   (Picture did not copy but they did print for pt) Lateral Trunk    Stand in doorway reaching overhead to the left and grab doorframe with Right hand and then lean left shoulder in until stretch felt along right trunk. Hold _10-20__ seconds. Do __3-5___ repetitions.   Lat Pull Down    Face anchor with knees slightly flexed and tummy tight. Palm down, pull arm down to side. Focus on slow and controlled motion. Keep elbow straight and shoulder down throughout up and down motions. Limit motion to where you can control the shoulder.  Repeat _10-20_ times per set. Do _2-3_ sets per day.  Anchor Height: Over Head   Cancer Rehab [phone]

## 2023-12-02 ENCOUNTER — Encounter: Payer: Self-pay | Admitting: Rehabilitation

## 2023-12-02 ENCOUNTER — Ambulatory Visit: Admitting: Rehabilitation

## 2023-12-02 ENCOUNTER — Inpatient Hospital Stay: Attending: Hematology and Oncology | Admitting: Hematology and Oncology

## 2023-12-02 VITALS — BP 141/74 | HR 89 | Temp 98.3°F | Resp 18 | Ht 64.0 in | Wt 265.0 lb

## 2023-12-02 DIAGNOSIS — Z79810 Long term (current) use of selective estrogen receptor modulators (SERMs): Secondary | ICD-10-CM | POA: Insufficient documentation

## 2023-12-02 DIAGNOSIS — G620 Drug-induced polyneuropathy: Secondary | ICD-10-CM | POA: Insufficient documentation

## 2023-12-02 DIAGNOSIS — C50911 Malignant neoplasm of unspecified site of right female breast: Secondary | ICD-10-CM | POA: Insufficient documentation

## 2023-12-02 DIAGNOSIS — Z803 Family history of malignant neoplasm of breast: Secondary | ICD-10-CM | POA: Insufficient documentation

## 2023-12-02 DIAGNOSIS — Z9221 Personal history of antineoplastic chemotherapy: Secondary | ICD-10-CM | POA: Insufficient documentation

## 2023-12-02 DIAGNOSIS — Z79899 Other long term (current) drug therapy: Secondary | ICD-10-CM | POA: Insufficient documentation

## 2023-12-02 DIAGNOSIS — Z853 Personal history of malignant neoplasm of breast: Secondary | ICD-10-CM | POA: Diagnosis not present

## 2023-12-02 DIAGNOSIS — Z9011 Acquired absence of right breast and nipple: Secondary | ICD-10-CM | POA: Diagnosis not present

## 2023-12-02 DIAGNOSIS — Z923 Personal history of irradiation: Secondary | ICD-10-CM | POA: Insufficient documentation

## 2023-12-02 DIAGNOSIS — N920 Excessive and frequent menstruation with regular cycle: Secondary | ICD-10-CM | POA: Insufficient documentation

## 2023-12-02 DIAGNOSIS — Z87891 Personal history of nicotine dependence: Secondary | ICD-10-CM | POA: Diagnosis not present

## 2023-12-02 DIAGNOSIS — M25611 Stiffness of right shoulder, not elsewhere classified: Secondary | ICD-10-CM

## 2023-12-02 DIAGNOSIS — I890 Lymphedema, not elsewhere classified: Secondary | ICD-10-CM | POA: Diagnosis not present

## 2023-12-02 DIAGNOSIS — Z170 Estrogen receptor positive status [ER+]: Secondary | ICD-10-CM | POA: Diagnosis not present

## 2023-12-02 DIAGNOSIS — I972 Postmastectomy lymphedema syndrome: Secondary | ICD-10-CM

## 2023-12-02 DIAGNOSIS — I10 Essential (primary) hypertension: Secondary | ICD-10-CM | POA: Insufficient documentation

## 2023-12-02 DIAGNOSIS — Z7985 Long-term (current) use of injectable non-insulin antidiabetic drugs: Secondary | ICD-10-CM | POA: Insufficient documentation

## 2023-12-02 DIAGNOSIS — E1142 Type 2 diabetes mellitus with diabetic polyneuropathy: Secondary | ICD-10-CM | POA: Insufficient documentation

## 2023-12-02 DIAGNOSIS — C50811 Malignant neoplasm of overlapping sites of right female breast: Secondary | ICD-10-CM | POA: Diagnosis not present

## 2023-12-02 NOTE — Therapy (Signed)
 OUTPATIENT PHYSICAL THERAPY ONCOLOGY TREATMENT     Patient Name: Karen Hopkins MRN: [id number] DOB:[birth year], 48 y.o., female Today's Date: 12/02/2023   PT End of Session - 12/02/23 0855     Visit Number 14    Number of Visits 19    Date for PT Re-Evaluation 12/06/23    PT Start Time 0900    PT Stop Time 0951    PT Time Calculation (min) 51 min    Activity Tolerance Patient tolerated treatment well    Behavior During Therapy [facility] for tasks assessed/performed             Past Medical History:  Diagnosis Date   Anxiety    Arthritis    Cancer (HCC) 2011   Rt. Br. Ca   Depression    Diabetes mellitus without complication (HCC)    has been borderline   Essential hypertension 11/05/2015   H. pylori infection    History of radiation therapy    Hypertension    under control with med., has been on med. x 2 yr.   Lymphedema of arm    right; no BP or puncture to right arm   Personal history of chemotherapy 09/16/2009   rt breast   Pneumonia    Seasonal allergies    Sinus headache    Past Surgical History:  Procedure Laterality Date   BREAST BIOPSY Right 08/26/2009   BREAST REDUCTION SURGERY Left 08/23/2013   Procedure: LEFT BREAST REDUCTION  ;  Surgeon: Estefana Reichert, DO;  Location: [facility];  Service: Plastics;  Laterality: Left;   BREAST REDUCTION WITH MASTOPEXY Left 08/22/2019   Procedure: Left breast mastopexy reduction with liposuction for breast symmetry;  Surgeon: Lowery Estefana RAMAN, DO;  Location: [facility];  Service: Plastics;  Laterality: Left;   CESAREAN SECTION  07/15/2001; 03/18/2004; 11/21/2008   lateral orbiotomy Left 11/2015   Dr. Melba at [facility]   LATISSIMUS FLAP TO BREAST Right 03/12/2013   Procedure: RIGHT BREAST LATISSIMUS FLAP WITH EXPANDER PLACEMENT;  Surgeon: Estefana Reichert, DO;  Location: [facility];  Service: Plastics;  Laterality: Right;   LIPOSUCTION Bilateral 08/23/2013   Procedure: LIPOSUCTION;  Surgeon: Estefana Reichert, DO;  Location: [facility];  Service: Plastics;  Laterality: Bilateral;   LIPOSUCTION WITH LIPOFILLING Left 08/22/2019   Procedure: LIPOSUCTION WITH LIPOFILLING;  Surgeon: Lowery Estefana RAMAN, DO;  Location: [facility];  Service: Plastics;  Laterality: Left;   MASTECTOMY Right 2011   MODIFIED RADICAL MASTECTOMY Right 03/11/2010   NASAL SEPTUM SURGERY     ORIF ANKLE FRACTURE Left 07/18/2018   Procedure: OPEN REDUCTION INTERNAL FIXATION (ORIF) LEFT ANKLE FRACTURE WITH  SYNDESMOSIS AND ANKLE ARTHROTOMY;  Surgeon: Elsa Lonni SAUNDERS, MD;  Location: [facility];  Service: Orthopedics;  Laterality: Left;   PORT-A-CATH REMOVAL Left 12/01/2010   PORTACATH PLACEMENT Left 09/12/2009   REDUCTION MAMMAPLASTY Left 08/2013   REMOVAL OF TISSUE EXPANDER AND PLACEMENT OF IMPLANT Right 08/23/2013   Procedure: REMOVAL RIGHT TISSUE EXPANDER AND PLACEMENT OF IMPLANT TO RIGHT BREAST ;  Surgeon: Estefana Reichert, DO;  Location: [facility];  Service: Plastics;  Laterality: Right;   SUPRA-UMBILICAL HERNIA  2006   TUBAL LIGATION  11/21/2008   UMBILICAL HERNIA REPAIR  04/12/2022   CCS DR   Patient Active Problem List   Diagnosis Date Noted   Contraceptive, surveillance, intrauterine device 04/13/2023   Nephrolithiasis 10/21/2022   H. pylori infection 09/25/2022   Abdominal hernia 09/25/2022  Seasonal allergies 09/25/2022   Carpal tunnel syndrome 10/07/2021   Genetic testing 08/14/2019   Breast asymmetry following reconstructive surgery 07/13/2019   Family history of breast cancer    Family history of prostate cancer    Unintentional weight loss 05/21/2019   Lymphedema of right arm 02/27/2018   Dyspnea 09/23/2016   Eczema 08/31/2016   Essential hypertension 11/05/2015   Cervical dystonia 08/25/2015   Headache 07/09/2015   Myofascial muscle pain 04/29/2015   Adhesive capsulitis of right shoulder 04/29/2015   Lower extremity edema 10/01/2014    Malignant neoplasm of overlapping sites of right breast in female, estrogen receptor positive (HCC) 05/24/2014   Myalgia and myositis 05/17/2014   Neoplasm related pain 03/12/2014   Constipation 11/26/2013   H/O reduction mammoplasty 10/30/2013   Seizure (HCC) 10/06/2013   Status post bilateral breast implants 08/31/2013   S/P breast reconstruction, right 08/23/2013   AC joint arthropathy 07/02/2013   Routine adult health maintenance 05/29/2013   Hypokalemia 05/21/2013   Chronic pain 04/19/2013   Acquired absence of breast and absent nipple 01/30/2013   RHINOSINUSITIS, CHRONIC 04/08/2010   Morbid obesity (HCC) 08/04/2006   DEPRESSIVE DISORDER, NOS 08/04/2006    PCP: Laymon Legions, MD  REFERRING PROVIDER: Legions Laymon PARAS, MD   REFERRING DIAG: I89.0 (ICD-10-CM) - Lymphedema of right arm   THERAPY DIAG:  Postmastectomy lymphedema  Stiffness of right shoulder, not elsewhere classified  History of right breast cancer  ONSET DATE: 2011  Rationale for Evaluation and Treatment Rehabilitation  SUBJECTIVE                                                                                                                                                                                           SUBJECTIVE STATEMENT: I am just sore from stretching it.    PERTINENT HISTORY:  Right breast cancer with mastectomy 2011 and ALND 22 nodes; reconstruction; left breast reduction; h/o right UE lymphedema and right upper quadrant pain, treated in this clinic several times before.  PAIN:  Are you having pain?No not right now  PRECAUTIONS: Other: R UE lymphedema, has metal in ankle  RED FLAGS: none  WEIGHT BEARING RESTRICTIONS No  FALLS:  Has patient fallen in last 6 months? No  LIVING ENVIRONMENT: Lives with: lives with 3 sons Lives in: House/apartment Stairs: No;  Has following equipment at home: None  OCCUPATION: on disability  LEISURE: trying to exercise by walking but  has been bothered by her L ankle pain  HAND DOMINANCE : right   PRIOR LEVEL OF FUNCTION: Independent  PATIENT GOALS to get my arm down and try to maintain it  OBJECTIVE  COGNITION:  Overall cognitive status: Within functional limits for tasks assessed    OBSERVATIONS / OTHER ASSESSMENTS: R upper arm considerable larger than left, area of swelling at posterior trunk just above band on bra  POSTURE: forward head, rounded shoulders  UPPER EXTREMITY AROM/PROM:  A/PROM RIGHT   eval  Right 11/18/23 Right  11/23/23 12/02/23  Shoulder extension 55     Shoulder flexion 133 142 145   Shoulder abduction 85 102 106   Shoulder internal rotation 40     Shoulder external rotation 60       (Blank rows = not tested)  A/PROM LEFT   eval  Shoulder extension 63  Shoulder flexion 175  Shoulder abduction 171  Shoulder internal rotation 64  Shoulder external rotation 95    (Blank rows = not tested)   LYMPHEDEMA ASSESSMENTS:   SURGERY TYPE/DATE: R mastectomy and ALND in 2011  NUMBER OF LYMPH NODES REMOVED: 1/20  CHEMOTHERAPY: completed  RADIATION:completed Jan 2012  HORMONE TREATMENT: anastrozole  from Nov 2016 to Dec 2019  INFECTIONS: none  LYMPHEDEMA ASSESSMENTS:   LANDMARK RIGHT  Eval on 03/16/22 10/25/23 11/11/23 11/16/23 11/23/23  10 cm proximal to olecranon process 47.1 50.7 47.2 47.4 47.1  Olecranon process 32.6 34.5 34 34.5 34.1  10 cm proximal to ulnar styloid process 23.6 26 26.4 26.5 25.4  Just proximal to ulnar styloid process 17.7 18 17.7 17.3 17.4  Across hand at thumb web space 21.4 22 20.2 20.2 20.3  At base of 2nd digit 6.1 6.2 5.8 6.1 5.8  (Blank rows = not tested)  LANDMARK LEFT  Eval on 03/16/22 10/25/23  10 cm proximal to olecranon process 43 46  Olecranon process 30 32.4  10 cm proximal to ulnar styloid process 24.4 26.1  Just proximal to ulnar styloid process 17.3 18.6  Across hand at thumb web space 20.6 22.1  At base of 2nd digit 6.4 6.4  (Blank  rows = not tested)   Flowsheet Row Outpatient Rehab from 10/25/2023 in [facility]  Lymphedema Life Impact Scale Total Score 72.06 %      TODAY'S TREATMENT  12/02/23: Therapeutic Exercises  Pulleys into flex and abd x 3 mins each with VC's to decrease Rt scapular compensation Roll yellow ball up wall into flexion x 10  Manual Therapy In supine: Short neck, 5 diaphragmatic breaths , Lt axillary and pectoral nodes, establishment of anterior inter-axillary pathway, Rt inguinal nodes and establishment of Rt axillo-inguinal pathway, then R UE working proximal to distal, moving inner upper arm outwards and upwards, and doing both sides of forearms, then retracing all steps Compression Bandaging to Rt UE: Cocoa butter, TG soft, artiflex to antecubital fossa, fixated 1/4 foam to forearm and then upper arm with Idealbinde, Molelast to fingers 1-4, artiflex to dorsal hand/wrist, then short stretch compression bandages as follows: 1 - 6 cm to hand/wrist with 1/2 gray foam to dorsal hand, then 1- 8 cm herring bone fashion from wrist to elbow, then 1 - 10 cm spiral with X at elbow, and then 1 - 12 cm spiral from wrist to axilla. Folded TG soft over top of bandage.   11/30/23: Therapeutic Exercises  Pulleys into flex and abd x 3 mins each with VC's to decrease Rt scapular compensation Roll yellow ball up wall into flexion x 10  Therapeutic Activities FreeMotion Machine for Rt UE ext with 7# and arm of machine on #4 with focus on decreasing scap compensation and engaging lateral trunk  as able due to lat flap Manual Therapy In supine: Short neck, 5 diaphragmatic breaths , Lt axillary and pectoral nodes, establishment of anterior inter-axillary pathway, Rt inguinal nodes and establishment of Rt axillo-inguinal pathway, then R UE working proximal to distal, moving inner upper arm outwards and upwards, and doing both sides of forearms, then retracing all steps Compression Bandaging  to Rt UE: Cocoa butter, TG soft, artiflex to antecubital fossa, fixated 1/4 foam to forearm and then upper arm with Idealbinde, Molelast to fingers 1-4, artiflex to dorsal hand/wrist, then short stretch compression bandages as follows: 1 - 6 cm to hand/wrist with 1/2 gray foam to dorsal hand, then 1- 8 cm herring bone fashion from wrist to elbow, then 1 - 10 cm spiral with X at elbow, and then 1 - 12 cm spiral from wrist to axilla. Folded TG soft over top of bandage.   11/28/23: Therapeutic Exercises  Pulleys into flex and abd x 3 mins each with VC's to decrease Rt scapular compensation Roll yellow ball up wall into flexion x 10 with cuing initially to remind pt of correct technique and then able to return correct demo Therapeutic Activities FreeMotion Machine for Rt UE ext with 7# and arm of machine on #4 with focus on decreasing scap compensation and engaging lateral trunk as able due to lat flap; spent time reviewing correct technique and muscle engagement Manual Therapy In supine: Short neck, 5 diaphragmatic breaths , Lt axillary and pectoral nodes, establishment of anterior inter-axillary pathway, Rt inguinal nodes and establishment of Rt axillo-inguinal pathway, then R UE working proximal to distal, moving inner upper arm outwards and upwards, and doing both sides of forearms, then retracing all steps P/ROM to Rt shoulder into flex, abd and D2 with scapular depression throughout by therapist STM to Rt lateral trunk where pt maximally tight  Compression Bandaging to Rt UE: Cocoa butter, TG soft, artiflex to antecubital fossa, fixated 1/4 foam to forearm and then upper arm with Idealbinde, Molelast to fingers 1-4, artiflex to dorsal hand/wrist, then short stretch compression bandages as follows: 1 - 6 cm to hand/wrist with 1/2 gray foam to dorsal hand, then 1- 8 cm herring bone fashion from wrist to elbow, then 1 - 10 cm spiral with X at elbow, and then 1 - 12 cm spiral from wrist to axilla.  Folded TG soft over top of bandage.   PATIENT EDUCATION:  Education details: Rt lateral trunk/UE stretch and Rt postural strength Tozzi educated: Patient Education method: Explanation Education comprehension: verbalized understanding   HOME EXERCISE PROGRAM: None yet  ASSESSMENT:  CLINICAL IMPRESSION: Continued with MLD and compression bandaging today.   OBJECTIVE IMPAIRMENTS decreased ROM and increased edema.   ACTIVITY LIMITATIONS carrying, lifting, and reach over head  PARTICIPATION LIMITATIONS: none  PERSONAL FACTORS Time since onset of injury/illness/exacerbation are also affecting patient's functional outcome.   REHAB POTENTIAL: Good  CLINICAL DECISION MAKING: Stable/uncomplicated  EVALUATION COMPLEXITY: Low  GOALS: Goals reviewed with patient? Yes  SHORT TERM GOALS: Target date: 11/15/23   Pt will demonstrate a 2 cm reduction in edema at 10 cm proximal to olecranon process.  Baseline: 50.7 cm; 11/23/23 - 47.1 (3.6 cm reduction) Goal status: MET  2.  Pt will demonstrate 110 degrees of R shoulder abduction to allow pt to reach out to the side.  Baseline: 85; 11/23/23 - 106 Goal status: MET  3.  Pt will obtain a compression bra for long term management of truncal edema especially posterior..  Baseline:  Goal  status: MET  4.  Pt will demonstrate 150 degrees of R shoulder flexion to allow her to reach overhead.  Baseline: 133; 11/23/23 - 145 degrees Goal status: ONGOING   LONG TERM GOALS: Target date: 12/06/23    Pt will demonstrate a 3 cm reduction in edema at 10 cm proximal to olecranon process.  Baseline: 50.7 cm; 11/23/23 - 47.1 cm (3.6 cm reduction) Goal status: MET  2.  Pt will demonstrate 165 degrees of R shoulder abduction to allow pt to reach out to the side.  Baseline: 85 degrees; 11/23/23 - 106 degrees Goal status: ONGOING  3.  Pt will obtain appropriate day time and night time garments for long term management of R UE lymphedema.  Baseline: Needs  new garments; 11/23/23 - pt was just measured today for new day and nighttime garments Goal status: PARTIALLY MET  4.  Pt will be able to independently manage her lymphedema through self MLD and compression garments.  Baseline: Pt is independent with self MLD and has been measured for compression garments. Goal status: PARTIALLY MET  5.  Pt will demonstrate 165 degrees of R shoulder flexion to allow her to reach overhead.  Baseline: 13 degrees; 11/23/23 - 145 degrees Goal status: ONGOING   PLAN: PT FREQUENCY: 3x/week  PT DURATION: 6 weeks  PLANNED INTERVENTIONS: Therapeutic exercises, Therapeutic activity, Patient/Family education, Self Care, Joint mobilization, Orthotic/Fit training, Manual lymph drainage, Compression bandaging, Vasopneumatic device, Manual therapy, and Re-evaluation  PLAN FOR NEXT SESSION:  Cont CDT for Rt UE until new garments arrive; review new HEP issued today and progress as able   Larue Saddie SAUNDERS, PT 12/02/2023, 9:52 AM   (Picture did not copy but they did print for pt) Lateral Trunk    Stand in doorway reaching overhead to the left and grab doorframe with Right hand and then lean left shoulder in until stretch felt along right trunk. Hold _10-20__ seconds. Do __3-5___ repetitions.   Lat Pull Down    Face anchor with knees slightly flexed and tummy tight. Palm down, pull arm down to side. Focus on slow and controlled motion. Keep elbow straight and shoulder down throughout up and down motions. Limit motion to where you can control the shoulder.  Repeat _10-20_ times per set. Do _2-3_ sets per day.  Anchor Height: Over Head   Cancer Rehab [phone]

## 2023-12-02 NOTE — Progress Notes (Signed)
 [facility] CONSULT NOTE  Patient Care Team: Donah Laymon PARAS, MD as PCP - General (Pediatrics) Ines Onetha NOVAK, MD as Consulting Physician (Neurology) Carilyn Prentice BRAVO, MD as Consulting Physician (Physical Medicine and Rehabilitation) Dillingham, Estefana RAMAN, DO as Attending Physician (Plastic Surgery) Raford Riggs, MD as Attending Physician (Cardiology) Magrinat, Sandria BROCKS, MD (Inactive) as Consulting Physician (Oncology) [place] Jeoffrey HERO, MD as Referring Physician (Family Medicine) Merrilyn Handler, MD (Inactive) as Consulting Physician (General Surgery) Dillingham, Estefana RAMAN, DO as Attending Physician (Plastic Surgery) Elsa Lonni SAUNDERS, MD as Consulting Physician (Orthopedic Surgery) Cleotilde Ronal RAMAN, MD as Consulting Physician (Gynecology)  CHIEF COMPLAINTS/PURPOSE OF CONSULTATION:  History of right breast cancer  ASSESSMENT & PLAN:   Assessment and Plan Assessment & Plan High-grade invasive ductal carcinoma of the right breast, triple positive (ER, PR, HER2) Fourteen years post-diagnosis with low recurrence risk.  Recent mammogram showed dense tissue in the lower inner quadrant of the left breast, repeat mammo and US  scheduled in October. - Continue annual follow-up visits. - Monitor mammogram and ultrasound results scheduled for October.  Lymphedema Chronic lymphedema due to lymph node removal with intermittent swelling and tightness. - Continue lymphedema clinic visits for management. - Use compression sleeve and perform recommended exercises.  Peripheral neuropathy Peripheral neuropathy likely from chemotherapy, causing chronic numbness and lack of sensation in fingers. - Continue gabapentin  for neuropathic pain management.  Pre-diabetes Pre-diabetic status with recent weight gain. - Initiate Ozempic  therapy per her provider  RTC in 1 yr.  HISTORY OF PRESENTING ILLNESS:  Karen Hopkins 48 y.o. female is here because of history of right  sided breast cancer.  Discussed the use of AI scribe software for clinical note transcription with the patient, who gave verbal consent to proceed.  History of Present Illness Karen Hopkins is a 48 year old female with a history of right-sided breast cancer who presents for follow-up after missing a previous appointment. She was referred back after missing a previous appointment with Dr. Layla.  She was diagnosed with high-grade invasive ductal carcinoma of the right breast in March 2011. The cancer was triple positive (ER, PR, and HER2 positive) with lymph node involvement and possible skin involvement. She underwent chemotherapy followed by a right-sided mastectomy in October 2011, which revealed residual cancer that was removed. Post-surgery, she received Herceptin for a year, post-mastectomy radiation, and was on tamoxifen  followed by anastrozole  for over seven years. Genetic testing was negative. She is now 14 years post-diagnosis.  She recently had a mammogram that indicated a need for follow-up in six months due to dense breast tissue, and she was scheduled for an ultrasound.  She experiences chronic lymphedema in the right arm due to lymph node removal, requiring periodic rewrapping and a new sleeve. She attends a lymphedema clinic and performs stretches to manage tightness.  She has a history of pre-diabetes, with recent weight gain contributing to elevated blood sugar levels. She quit smoking in 2020 and is currently not working.  She experiences numbness in her fingers and shoulder, a residual side effect from chemotherapy. She takes gabapentin  and a muscle relaxer for muscle spasms and tightness.  She has an IUD for menstrual regulation due to previously heavy periods. She is premenopausal with occasional spotting and PMS symptoms.  No changes in breathing, cough, unexplained bone pain, changes in bowel habits, blood in stool, black stool, headaches, double vision, falls, or  seizures.  All other systems were reviewed with the patient and are negative.  MEDICAL HISTORY:  Past Medical History:  Diagnosis Date   Anxiety    Arthritis    Cancer (HCC) 2011   Rt. Br. Ca   Depression    Diabetes mellitus without complication (HCC)    has been borderline   Essential hypertension 11/05/2015   H. pylori infection    History of radiation therapy    Hypertension    under control with med., has been on med. x 2 yr.   Lymphedema of arm    right; no BP or puncture to right arm   Personal history of chemotherapy 09/16/2009   rt breast   Pneumonia    Seasonal allergies    Sinus headache     SURGICAL HISTORY: Past Surgical History:  Procedure Laterality Date   BREAST BIOPSY Right 08/26/2009   BREAST REDUCTION SURGERY Left 08/23/2013   Procedure: LEFT BREAST REDUCTION  ;  Surgeon: Estefana Reichert, DO;  Location: [facility];  Service: Plastics;  Laterality: Left;   BREAST REDUCTION WITH MASTOPEXY Left 08/22/2019   Procedure: Left breast mastopexy reduction with liposuction for breast symmetry;  Surgeon: Lowery Estefana RAMAN, DO;  Location: [facility];  Service: Plastics;  Laterality: Left;   CESAREAN SECTION  07/15/2001; 03/18/2004; 11/21/2008   lateral orbiotomy Left 11/2015   Dr. Melba at [facility]   LATISSIMUS FLAP TO BREAST Right 03/12/2013   Procedure: RIGHT BREAST LATISSIMUS FLAP WITH EXPANDER PLACEMENT;  Surgeon: Estefana Reichert, DO;  Location: [facility];  Service: Plastics;  Laterality: Right;   LIPOSUCTION Bilateral 08/23/2013   Procedure: LIPOSUCTION;  Surgeon: Estefana Reichert, DO;  Location: [facility];  Service: Plastics;  Laterality: Bilateral;   LIPOSUCTION WITH LIPOFILLING Left 08/22/2019   Procedure: LIPOSUCTION WITH LIPOFILLING;  Surgeon: Lowery Estefana RAMAN, DO;  Location: [facility];  Service: Plastics;  Laterality: Left;   MASTECTOMY Right 2011   MODIFIED RADICAL MASTECTOMY Right 03/11/2010    NASAL SEPTUM SURGERY     ORIF ANKLE FRACTURE Left 07/18/2018   Procedure: OPEN REDUCTION INTERNAL FIXATION (ORIF) LEFT ANKLE FRACTURE WITH  SYNDESMOSIS AND ANKLE ARTHROTOMY;  Surgeon: Elsa Lonni SAUNDERS, MD;  Location: [facility];  Service: Orthopedics;  Laterality: Left;   PORT-A-CATH REMOVAL Left 12/01/2010   PORTACATH PLACEMENT Left 09/12/2009   REDUCTION MAMMAPLASTY Left 08/2013   REMOVAL OF TISSUE EXPANDER AND PLACEMENT OF IMPLANT Right 08/23/2013   Procedure: REMOVAL RIGHT TISSUE EXPANDER AND PLACEMENT OF IMPLANT TO RIGHT BREAST ;  Surgeon: Estefana Reichert, DO;  Location: [facility];  Service: Plastics;  Laterality: Right;   SUPRA-UMBILICAL HERNIA  2006   TUBAL LIGATION  11/21/2008   UMBILICAL HERNIA REPAIR  04/12/2022   [facility] DR    SOCIAL HISTORY: Social History   Socioeconomic History   Marital status: Single    Spouse name: Not on file   Number of children: 3   Years of education: 16   Highest education level: Not on file  Occupational History   Occupation: Disability   Tobacco Use   Smoking status: Former    Current packs/day: 0.00    Types: Cigarettes    Start date: 06/07/2002    Quit date: 05/08/2019    Years since quitting: 4.5   Smokeless tobacco: Never  Vaping Use   Vaping status: Never Used  Substance and Sexual Activity   Alcohol  use: No   Drug use: No   Sexual activity: Yes    Partners: Male    Birth control/protection: I.U.D.,  Surgical    Comment: Mirena  IUD inserted 06/04/19  Other Topics Concern   Not on file  Social History Narrative   Lives with kids   Caffeine use: none    Social Drivers of Corporate investment banker Strain: Low Risk  (02/16/2023)   Overall Financial Resource Strain (CARDIA)    Difficulty of Paying Living Expenses: Not hard at all  Food Insecurity: No Food Insecurity (02/16/2023)   Hunger Vital Sign    Worried About Running Out of Food in the Last Year: Never true    Ran Out of Food in the Last  Year: Never true  Transportation Needs: No Transportation Needs (02/16/2023)   PRAPARE - Administrator, Civil Service (Medical): No    Lack of Transportation (Non-Medical): No  Physical Activity: Inactive (02/16/2023)   Exercise Vital Sign    Days of Exercise per Week: 0 days    Minutes of Exercise per Session: 0 min  Stress: No Stress Concern Present (02/16/2023)   [employer] of Occupational Health - Occupational Stress Questionnaire    Feeling of Stress : Not at all  Social Connections: Moderately Isolated (02/16/2023)   Social Connection and Isolation Panel    Frequency of Communication with Friends and Family: More than three times a week    Frequency of Social Gatherings with Friends and Family: Three times a week    Attends Religious Services: 1 to 4 times per year    Active Member of Clubs or Organizations: No    Attends Banker Meetings: Never    Marital Status: Never married  Intimate Partner Violence: Not At Risk (02/16/2023)   Humiliation, Afraid, Rape, and Kick questionnaire    Fear of Current or Ex-Partner: No    Emotionally Abused: No    Physically Abused: No    Sexually Abused: No    FAMILY HISTORY: Family History  Problem Relation Age of Onset   Hypertension Mother    Breast cancer Mother 76   Diabetes type II Father    Prostate cancer Father 6   Stroke Neg Hx    Colon polyps Neg Hx    Esophageal cancer Neg Hx    Pancreatic cancer Neg Hx    Stomach cancer Neg Hx    Rectal cancer Neg Hx     ALLERGIES:  is allergic to penicillins, lyrica  [pregabalin ], meloxicam , and robaxin  [methocarbamol ].  MEDICATIONS:  Current Outpatient Medications  Medication Sig Dispense Refill   cyclobenzaprine  (FLEXERIL ) 10 MG tablet Take 10 mg by mouth 2 (two) times daily as needed.     fexofenadine  (ALLEGRA ) 180 MG tablet Take 1 tablet (180 mg total) by mouth daily. 30 tablet 2   fluticasone  (FLONASE ) 50 MCG/ACT nasal spray SHAKE LIQUID AND USE 2  SPRAYS IN EACH NOSTRIL DAILY 16 g 3   gabapentin  (NEURONTIN ) 100 MG capsule Take 200 mg by mouth 2 (two) times daily.     HYDROcodone -acetaminophen  (NORCO/VICODIN) 5-325 MG tablet Take 1 tablet by mouth every 8 (eight) hours as needed for severe pain.     levonorgestrel  (MIRENA ) 20 MCG/24HR IUD 1 each by Intrauterine route once.     omeprazole  (PRILOSEC) 40 MG capsule TAKE 1 CAPSULE(40 MG) BY MOUTH DAILY 60 capsule 2   Semaglutide ,0.25 or 0.5MG /DOS, 2 MG/1.5ML SOPN Inject 0.25 mg into the skin once a week. 0.25 mg once weekly 3 mL 1   No current facility-administered medications for this visit.     PHYSICAL EXAMINATION: ECOG PERFORMANCE STATUS: 0 -  Asymptomatic  Vitals:   12/02/23 1106  BP: (!) 141/74  Pulse: 89  Resp: 18  Temp: 98.3 F (36.8 C)  SpO2: 100%   Filed Weights   12/02/23 1106  Weight: 265 lb (120.2 kg)    GENERAL:alert, no distress and comfortable SKIN: skin color, texture, turgor are normal, no rashes or significant lesions EYES: normal, conjunctiva are pink and non-injected, sclera clear OROPHARYNX:no exudate, no erythema and lips, buccal mucosa, and tongue normal  NECK: supple, thyroid  normal size, non-tender, without nodularity LYMPH:  no palpable lymphadenopathy in the cervical, axillary LUNGS: clear to auscultation and percussion with normal breathing effort HEART: regular rate & rhythm and no murmurs and no lower extremity edema ABDOMEN:abdomen soft, non-tender and normal bowel sounds Musculoskeletal:no cyanosis of digits and no clubbing  PSYCH: alert & oriented x 3 with fluent speech NEURO: no focal motor/sensory deficits  LABORATORY DATA:  I have reviewed the data as listed Lab Results  Component Value Date   WBC 9.8 09/23/2022   HGB 13.6 09/23/2022   HCT 42.9 09/23/2022   MCV 91 09/23/2022   PLT 354 09/23/2022     Chemistry      Component Value Date/Time   NA 140 10/24/2023 1117   NA 141 05/25/2017 1140   K 4.0 10/24/2023 1117   K 3.3  (L) 05/25/2017 1140   CL 107 (H) 10/24/2023 1117   CL 105 09/27/2012 1044   CO2 19 (L) 10/24/2023 1117   CO2 25 05/25/2017 1140   BUN 7 10/24/2023 1117   BUN 9.1 05/25/2017 1140   CREATININE 0.63 10/24/2023 1117   CREATININE 0.63 05/06/2021 1135   CREATININE 0.8 05/25/2017 1140      Component Value Date/Time   CALCIUM 9.0 10/24/2023 1117   CALCIUM 9.3 05/25/2017 1140   ALKPHOS 74 10/24/2023 1117   ALKPHOS 113 05/25/2017 1140   AST 16 10/24/2023 1117   AST 15 05/06/2021 1135   AST 9 05/25/2017 1140   ALT 14 10/24/2023 1117   ALT 13 05/06/2021 1135   ALT 11 05/25/2017 1140   BILITOT 0.2 10/24/2023 1117   BILITOT 0.4 05/06/2021 1135   BILITOT 0.23 05/25/2017 1140       RADIOGRAPHIC STUDIES: I have personally reviewed the radiological images as listed and agreed with the findings in the report. No results found.  All questions were answered. The patient knows to call the clinic with any problems, questions or concerns. I spent 45 minutes in the care of this patient including H and P, review of records, counseling and coordination of care.     Amber Stalls, MD 12/02/2023 11:09 AM

## 2023-12-05 ENCOUNTER — Encounter: Payer: Self-pay | Admitting: Rehabilitation

## 2023-12-05 ENCOUNTER — Ambulatory Visit: Admitting: Rehabilitation

## 2023-12-05 DIAGNOSIS — I972 Postmastectomy lymphedema syndrome: Secondary | ICD-10-CM | POA: Diagnosis not present

## 2023-12-05 DIAGNOSIS — Z853 Personal history of malignant neoplasm of breast: Secondary | ICD-10-CM

## 2023-12-05 DIAGNOSIS — M25611 Stiffness of right shoulder, not elsewhere classified: Secondary | ICD-10-CM

## 2023-12-05 NOTE — Therapy (Signed)
 OUTPATIENT PHYSICAL THERAPY ONCOLOGY TREATMENT     Patient Name: Karen Hopkins MRN: [id number] DOB:Jun 26, 1975, 48 y.o., female Today's Date: 12/05/2023   PT End of Session - 12/05/23 1357     Visit Number 15    Number of Visits 27    Date for PT Re-Evaluation 01/02/24    PT Start Time 1400    PT Stop Time 1450    PT Time Calculation (min) 50 min    Activity Tolerance Patient tolerated treatment well    Behavior During Therapy [facility] for tasks assessed/performed             Past Medical History:  Diagnosis Date   Anxiety    Arthritis    Cancer (HCC) 2011   Rt. Br. Ca   Depression    Diabetes mellitus without complication (HCC)    has been borderline   Essential hypertension 11/05/2015   H. pylori infection    History of radiation therapy    Hypertension    under control with med., has been on med. x 2 yr.   Lymphedema of arm    right; no BP or puncture to right arm   Personal history of chemotherapy 09/16/2009   rt breast   Pneumonia    Seasonal allergies    Sinus headache    Past Surgical History:  Procedure Laterality Date   BREAST BIOPSY Right 08/26/2009   BREAST REDUCTION SURGERY Left 08/23/2013   Procedure: LEFT BREAST REDUCTION  ;  Surgeon: Estefana Reichert, DO;  Location: [facility];  Service: Plastics;  Laterality: Left;   BREAST REDUCTION WITH MASTOPEXY Left 08/22/2019   Procedure: Left breast mastopexy reduction with liposuction for breast symmetry;  Surgeon: Lowery Estefana RAMAN, DO;  Location: [facility];  Service: Plastics;  Laterality: Left;   CESAREAN SECTION  07/15/2001; 03/18/2004; 11/21/2008   lateral orbiotomy Left 11/2015   Dr. Melba at [facility]   LATISSIMUS FLAP TO BREAST Right 03/12/2013   Procedure: RIGHT BREAST LATISSIMUS FLAP WITH EXPANDER PLACEMENT;  Surgeon: Estefana Reichert, DO;  Location: [facility];  Service: Plastics;  Laterality: Right;   LIPOSUCTION Bilateral 08/23/2013   Procedure: LIPOSUCTION;  Surgeon: Estefana Reichert, DO;  Location: [facility];  Service: Plastics;  Laterality: Bilateral;   LIPOSUCTION WITH LIPOFILLING Left 08/22/2019   Procedure: LIPOSUCTION WITH LIPOFILLING;  Surgeon: Lowery Estefana RAMAN, DO;  Location: [facility];  Service: Plastics;  Laterality: Left;   MASTECTOMY Right 2011   MODIFIED RADICAL MASTECTOMY Right 03/11/2010   NASAL SEPTUM SURGERY     ORIF ANKLE FRACTURE Left 07/18/2018   Procedure: OPEN REDUCTION INTERNAL FIXATION (ORIF) LEFT ANKLE FRACTURE WITH  SYNDESMOSIS AND ANKLE ARTHROTOMY;  Surgeon: Elsa Lonni SAUNDERS, MD;  Location: [facility];  Service: Orthopedics;  Laterality: Left;   PORT-A-CATH REMOVAL Left 12/01/2010   PORTACATH PLACEMENT Left 09/12/2009   REDUCTION MAMMAPLASTY Left 08/2013   REMOVAL OF TISSUE EXPANDER AND PLACEMENT OF IMPLANT Right 08/23/2013   Procedure: REMOVAL RIGHT TISSUE EXPANDER AND PLACEMENT OF IMPLANT TO RIGHT BREAST ;  Surgeon: Estefana Reichert, DO;  Location: [facility];  Service: Plastics;  Laterality: Right;   SUPRA-UMBILICAL HERNIA  2006   TUBAL LIGATION  11/21/2008   UMBILICAL HERNIA REPAIR  04/12/2022   CCS DR   Patient Active Problem List   Diagnosis Date Noted   Contraceptive, surveillance, intrauterine device 04/13/2023   Nephrolithiasis 10/21/2022   H. pylori infection 09/25/2022   Abdominal hernia 09/25/2022  Seasonal allergies 09/25/2022   Carpal tunnel syndrome 10/07/2021   Genetic testing 08/14/2019   Breast asymmetry following reconstructive surgery 07/13/2019   Family history of breast cancer    Family history of prostate cancer    Unintentional weight loss 05/21/2019   Lymphedema of right arm 02/27/2018   Dyspnea 09/23/2016   Eczema 08/31/2016   Essential hypertension 11/05/2015   Cervical dystonia 08/25/2015   Headache 07/09/2015   Myofascial muscle pain 04/29/2015   Adhesive capsulitis of right shoulder 04/29/2015   Lower extremity edema 10/01/2014    Malignant neoplasm of overlapping sites of right breast in female, estrogen receptor positive (HCC) 05/24/2014   Myalgia and myositis 05/17/2014   Neoplasm related pain 03/12/2014   Constipation 11/26/2013   H/O reduction mammoplasty 10/30/2013   Seizure (HCC) 10/06/2013   Status post bilateral breast implants 08/31/2013   S/P breast reconstruction, right 08/23/2013   AC joint arthropathy 07/02/2013   Routine adult health maintenance 05/29/2013   Hypokalemia 05/21/2013   Chronic pain 04/19/2013   Acquired absence of breast and absent nipple 01/30/2013   RHINOSINUSITIS, CHRONIC 04/08/2010   Morbid obesity (HCC) 08/04/2006   DEPRESSIVE DISORDER, NOS 08/04/2006    PCP: Laymon Legions, MD  REFERRING PROVIDER: Legions Laymon PARAS, MD   REFERRING DIAG: I89.0 (ICD-10-CM) - Lymphedema of right arm   THERAPY DIAG:  Postmastectomy lymphedema  Stiffness of right shoulder, not elsewhere classified  History of right breast cancer  ONSET DATE: 2011  Rationale for Evaluation and Treatment Rehabilitation  SUBJECTIVE                                                                                                                                                                                           SUBJECTIVE STATEMENT: Nothing new   PERTINENT HISTORY:  Right breast cancer with mastectomy 2011 and ALND 22 nodes; reconstruction; left breast reduction; h/o right UE lymphedema and right upper quadrant pain, treated in this clinic several times before.  PAIN:  Are you having pain?No not right now  PRECAUTIONS: Other: R UE lymphedema, has metal in ankle  RED FLAGS: none  WEIGHT BEARING RESTRICTIONS No  FALLS:  Has patient fallen in last 6 months? No  LIVING ENVIRONMENT: Lives with: lives with 3 sons Lives in: House/apartment Stairs: No;  Has following equipment at home: None  OCCUPATION: on disability  LEISURE: trying to exercise by walking but has been bothered by her  L ankle pain  HAND DOMINANCE : right   PRIOR LEVEL OF FUNCTION: Independent  PATIENT GOALS to get my arm down and try to maintain it   OBJECTIVE  COGNITION:  Overall  cognitive status: Within functional limits for tasks assessed    OBSERVATIONS / OTHER ASSESSMENTS: R upper arm considerable larger than left, area of swelling at posterior trunk just above band on bra  POSTURE: forward head, rounded shoulders  UPPER EXTREMITY AROM/PROM:  A/PROM RIGHT   eval  Right 11/18/23 Right  11/23/23 12/02/23  Shoulder extension 55     Shoulder flexion 133 142 145 145  Shoulder abduction 85 102 106 162  Shoulder internal rotation 40     Shoulder external rotation 60       (Blank rows = not tested)  A/PROM LEFT   eval  Shoulder extension 63  Shoulder flexion 175  Shoulder abduction 171  Shoulder internal rotation 64  Shoulder external rotation 95    (Blank rows = not tested)   LYMPHEDEMA ASSESSMENTS:   SURGERY TYPE/DATE: R mastectomy and ALND in 2011  NUMBER OF LYMPH NODES REMOVED: 1/20  CHEMOTHERAPY: completed  RADIATION:completed Jan 2012  HORMONE TREATMENT: anastrozole  from Nov 2016 to Dec 2019  INFECTIONS: none  LYMPHEDEMA ASSESSMENTS:   LANDMARK RIGHT  Eval on 03/16/22 10/25/23 11/11/23 11/16/23 11/23/23  10 cm proximal to olecranon process 47.1 50.7 47.2 47.4 47.1  Olecranon process 32.6 34.5 34 34.5 34.1  10 cm proximal to ulnar styloid process 23.6 26 26.4 26.5 25.4  Just proximal to ulnar styloid process 17.7 18 17.7 17.3 17.4  Across hand at thumb web space 21.4 22 20.2 20.2 20.3  At base of 2nd digit 6.1 6.2 5.8 6.1 5.8  (Blank rows = not tested)  LANDMARK LEFT  Eval on 03/16/22 10/25/23  10 cm proximal to olecranon process 43 46  Olecranon process 30 32.4  10 cm proximal to ulnar styloid process 24.4 26.1  Just proximal to ulnar styloid process 17.3 18.6  Across hand at thumb web space 20.6 22.1  At base of 2nd digit 6.4 6.4  (Blank rows = not  tested)   Flowsheet Row Outpatient Rehab from 10/25/2023 in [facility]  Lymphedema Life Impact Scale Total Score 72.06 %      TODAY'S TREATMENT  12/05/23 Rechecked goals and extended POC Therapeutic Exercises  Pulleys into flex and abd x 3 mins each with VC's to decrease Rt scapular compensation Roll yellow ball up wall into flexion x 10  Manual Therapy In supine: Short neck, 5 diaphragmatic breaths , Lt axillary and pectoral nodes, establishment of anterior inter-axillary pathway, Rt inguinal nodes and establishment of Rt axillo-inguinal pathway, then R UE working proximal to distal, moving inner upper arm outwards and upwards, and doing both sides of forearms, then retracing all steps Compression Bandaging to Rt UE: Cocoa butter, TG soft, artiflex to antecubital fossa, fixated 1/4 foam to forearm and then upper arm with Idealbinde, Molelast to fingers 1-4, artiflex to dorsal hand/wrist, then short stretch compression bandages as follows: 1 - 6 cm to hand/wrist with 1/2 gray foam to dorsal hand, then 1- 8 cm herring bone fashion from wrist to elbow, then 1 - 10 cm spiral with X at elbow, and then 1 - 12 cm spiral from wrist to axilla. Folded TG soft over top of bandage.   12/02/23: Therapeutic Exercises  Pulleys into flex and abd x 3 mins each with VC's to decrease Rt scapular compensation Roll yellow ball up wall into flexion x 10  Manual Therapy In supine: Short neck, 5 diaphragmatic breaths , Lt axillary and pectoral nodes, establishment of anterior inter-axillary pathway, Rt inguinal nodes and establishment of  Rt axillo-inguinal pathway, then R UE working proximal to distal, moving inner upper arm outwards and upwards, and doing both sides of forearms, then retracing all steps Compression Bandaging to Rt UE: Cocoa butter, TG soft, artiflex to antecubital fossa, fixated 1/4 foam to forearm and then upper arm with Idealbinde, Molelast to fingers 1-4, artiflex  to dorsal hand/wrist, then short stretch compression bandages as follows: 1 - 6 cm to hand/wrist with 1/2 gray foam to dorsal hand, then 1- 8 cm herring bone fashion from wrist to elbow, then 1 - 10 cm spiral with X at elbow, and then 1 - 12 cm spiral from wrist to axilla. Folded TG soft over top of bandage.   11/30/23: Therapeutic Exercises  Pulleys into flex and abd x 3 mins each with VC's to decrease Rt scapular compensation Roll yellow ball up wall into flexion x 10  Therapeutic Activities FreeMotion Machine for Rt UE ext with 7# and arm of machine on #4 with focus on decreasing scap compensation and engaging lateral trunk as able due to lat flap Manual Therapy In supine: Short neck, 5 diaphragmatic breaths , Lt axillary and pectoral nodes, establishment of anterior inter-axillary pathway, Rt inguinal nodes and establishment of Rt axillo-inguinal pathway, then R UE working proximal to distal, moving inner upper arm outwards and upwards, and doing both sides of forearms, then retracing all steps Compression Bandaging to Rt UE: Cocoa butter, TG soft, artiflex to antecubital fossa, fixated 1/4 foam to forearm and then upper arm with Idealbinde, Molelast to fingers 1-4, artiflex to dorsal hand/wrist, then short stretch compression bandages as follows: 1 - 6 cm to hand/wrist with 1/2 gray foam to dorsal hand, then 1- 8 cm herring bone fashion from wrist to elbow, then 1 - 10 cm spiral with X at elbow, and then 1 - 12 cm spiral from wrist to axilla. Folded TG soft over top of bandage.    PATIENT EDUCATION:  Education details: Rt lateral trunk/UE stretch and Rt postural strength [place] educated: Patient Education method: Explanation Education comprehension: verbalized understanding   HOME EXERCISE PROGRAM: None yet  ASSESSMENT:  CLINICAL IMPRESSION: Pt is still progressing towards her PT goals.  She is mainly awaiting her final garments at this time.  She continues with stiffness in the  Rt arm into flexion and abduction but is showing improvements.  Will continue 3x per week until garments arrive.   OBJECTIVE IMPAIRMENTS decreased ROM and increased edema.   ACTIVITY LIMITATIONS carrying, lifting, and reach over head  PARTICIPATION LIMITATIONS: none  PERSONAL FACTORS Time since onset of injury/illness/exacerbation are also affecting patient's functional outcome.   REHAB POTENTIAL: Good  CLINICAL DECISION MAKING: Stable/uncomplicated  EVALUATION COMPLEXITY: Low  GOALS: Goals reviewed with patient? Yes  SHORT TERM GOALS: Target date: 11/15/23   Pt will demonstrate a 2 cm reduction in edema at 10 cm proximal to olecranon process.  Baseline: 50.7 cm; 11/23/23 - 47.1 (3.6 cm reduction) Goal status: MET  2.  Pt will demonstrate 110 degrees of R shoulder abduction to allow pt to reach out to the side.  Baseline: 85; 11/23/23 - 106 Goal status: MET  3.  Pt will obtain a compression bra for long term management of truncal edema especially posterior..  Baseline:  Goal status: MET  4.  Pt will demonstrate 150 degrees of R shoulder flexion to allow her to reach overhead.  Baseline: 133; 11/23/23 - 145 degrees, 12/05/23 - 145 deg  Goal status: ONGOING   LONG TERM GOALS: Target  date: 01/02/24    Pt will demonstrate a 3 cm reduction in edema at 10 cm proximal to olecranon process.  Baseline: 50.7 cm; 11/23/23 - 47.1 cm (3.6 cm reduction) Goal status: MET  2.  Pt will demonstrate 165 degrees of R shoulder abduction to allow pt to reach out to the side.  Baseline: 85 degrees; 11/23/23 - 106 degrees, 12/05/23 - 165 Goal status: MET  3.  Pt will obtain appropriate day time and night time garments for long term management of R UE lymphedema.  Baseline: Needs new garments; 11/23/23 - pt was just measured today for new day and nighttime garments Goal status: PARTIALLY MET  4.  Pt will be able to independently manage her lymphedema through self MLD and compression garments.   Baseline: Pt is independent with self MLD and has been measured for compression garments. Goal status: PARTIALLY MET  5.  Pt will demonstrate 165 degrees of R shoulder flexion to allow her to reach overhead.  Baseline: 13 degrees; 11/23/23 - 145 degrees Goal status: ONGOING   PLAN: PT FREQUENCY: 3x/week  PT DURATION: 6 weeks  PLANNED INTERVENTIONS: Therapeutic exercises, Therapeutic activity, Patient/Family education, Self Care, Joint mobilization, Orthotic/Fit training, Manual lymph drainage, Compression bandaging, Vasopneumatic device, Manual therapy, and Re-evaluation  PLAN FOR NEXT SESSION:  Cont CDT for Rt UE until new garments arrive; review new HEP issued today and progress as able   Larue Saddie SAUNDERS, PT 12/05/2023, 3:01 PM   (Picture did not copy but they did print for pt) Lateral Trunk    Stand in doorway reaching overhead to the left and grab doorframe with Right hand and then lean left shoulder in until stretch felt along right trunk. Hold _10-20__ seconds. Do __3-5___ repetitions.   Lat Pull Down    Face anchor with knees slightly flexed and tummy tight. Palm down, pull arm down to side. Focus on slow and controlled motion. Keep elbow straight and shoulder down throughout up and down motions. Limit motion to where you can control the shoulder.  Repeat _10-20_ times per set. Do _2-3_ sets per day.  Anchor Height: Over Head   Cancer Rehab [phone]

## 2023-12-08 ENCOUNTER — Encounter: Payer: Self-pay | Admitting: Physical Therapy

## 2023-12-08 ENCOUNTER — Ambulatory Visit: Attending: Family Medicine | Admitting: Physical Therapy

## 2023-12-08 DIAGNOSIS — Z853 Personal history of malignant neoplasm of breast: Secondary | ICD-10-CM | POA: Diagnosis not present

## 2023-12-08 DIAGNOSIS — I972 Postmastectomy lymphedema syndrome: Secondary | ICD-10-CM | POA: Insufficient documentation

## 2023-12-08 DIAGNOSIS — M25611 Stiffness of right shoulder, not elsewhere classified: Secondary | ICD-10-CM | POA: Insufficient documentation

## 2023-12-08 NOTE — Therapy (Signed)
 OUTPATIENT PHYSICAL THERAPY ONCOLOGY TREATMENT     Patient Name: Karen Hopkins MRN: [id number] DOB:02-17-1976, 48 y.o., female Today's Date: 12/08/2023   PT End of Session - 12/08/23 1313     Visit Number 16    Number of Visits 27    Date for PT Re-Evaluation 01/02/24    PT Start Time 1305    PT Stop Time 1357    PT Time Calculation (min) 52 min    Activity Tolerance Patient tolerated treatment well    Behavior During Therapy [facility] for tasks assessed/performed             Past Medical History:  Diagnosis Date   Anxiety    Arthritis    Cancer (HCC) 2011   Rt. Br. Ca   Depression    Diabetes mellitus without complication (HCC)    has been borderline   Essential hypertension 11/05/2015   H. pylori infection    History of radiation therapy    Hypertension    under control with med., has been on med. x 2 yr.   Lymphedema of arm    right; no BP or puncture to right arm   Personal history of chemotherapy 09/16/2009   rt breast   Pneumonia    Seasonal allergies    Sinus headache    Past Surgical History:  Procedure Laterality Date   BREAST BIOPSY Right 08/26/2009   BREAST REDUCTION SURGERY Left 08/23/2013   Procedure: LEFT BREAST REDUCTION  ;  Surgeon: Estefana Reichert, DO;  Location: [facility];  Service: Plastics;  Laterality: Left;   BREAST REDUCTION WITH MASTOPEXY Left 08/22/2019   Procedure: Left breast mastopexy reduction with liposuction for breast symmetry;  Surgeon: Lowery Estefana RAMAN, DO;  Location: [facility];  Service: Plastics;  Laterality: Left;   CESAREAN SECTION  07/15/2001; 03/18/2004; 11/21/2008   lateral orbiotomy Left 11/2015   Dr. Melba at [facility]   LATISSIMUS FLAP TO BREAST Right 03/12/2013   Procedure: RIGHT BREAST LATISSIMUS FLAP WITH EXPANDER PLACEMENT;  Surgeon: Estefana Reichert, DO;  Location: [facility];  Service: Plastics;  Laterality: Right;   LIPOSUCTION Bilateral 08/23/2013   Procedure: LIPOSUCTION;  Surgeon: Estefana Reichert, DO;  Location: [facility];  Service: Plastics;  Laterality: Bilateral;   LIPOSUCTION WITH LIPOFILLING Left 08/22/2019   Procedure: LIPOSUCTION WITH LIPOFILLING;  Surgeon: Lowery Estefana RAMAN, DO;  Location: [facility];  Service: Plastics;  Laterality: Left;   MASTECTOMY Right 2011   MODIFIED RADICAL MASTECTOMY Right 03/11/2010   NASAL SEPTUM SURGERY     ORIF ANKLE FRACTURE Left 07/18/2018   Procedure: OPEN REDUCTION INTERNAL FIXATION (ORIF) LEFT ANKLE FRACTURE WITH  SYNDESMOSIS AND ANKLE ARTHROTOMY;  Surgeon: Elsa Lonni SAUNDERS, MD;  Location: [facility];  Service: Orthopedics;  Laterality: Left;   PORT-A-CATH REMOVAL Left 12/01/2010   PORTACATH PLACEMENT Left 09/12/2009   REDUCTION MAMMAPLASTY Left 08/2013   REMOVAL OF TISSUE EXPANDER AND PLACEMENT OF IMPLANT Right 08/23/2013   Procedure: REMOVAL RIGHT TISSUE EXPANDER AND PLACEMENT OF IMPLANT TO RIGHT BREAST ;  Surgeon: Estefana Reichert, DO;  Location: [facility];  Service: Plastics;  Laterality: Right;   SUPRA-UMBILICAL HERNIA  2006   TUBAL LIGATION  11/21/2008   UMBILICAL HERNIA REPAIR  04/12/2022   CCS DR   Patient Active Problem List   Diagnosis Date Noted   Contraceptive, surveillance, intrauterine device 04/13/2023   Nephrolithiasis 10/21/2022   H. pylori infection 09/25/2022   Abdominal hernia 09/25/2022  Seasonal allergies 09/25/2022   Carpal tunnel syndrome 10/07/2021   Genetic testing 08/14/2019   Breast asymmetry following reconstructive surgery 07/13/2019   Family history of breast cancer    Family history of prostate cancer    Unintentional weight loss 05/21/2019   Lymphedema of right arm 02/27/2018   Dyspnea 09/23/2016   Eczema 08/31/2016   Essential hypertension 11/05/2015   Cervical dystonia 08/25/2015   Headache 07/09/2015   Myofascial muscle pain 04/29/2015   Adhesive capsulitis of right shoulder 04/29/2015   Lower extremity edema 10/01/2014    Malignant neoplasm of overlapping sites of right breast in female, estrogen receptor positive (HCC) 05/24/2014   Myalgia and myositis 05/17/2014   Neoplasm related pain 03/12/2014   Constipation 11/26/2013   H/O reduction mammoplasty 10/30/2013   Seizure (HCC) 10/06/2013   Status post bilateral breast implants 08/31/2013   S/P breast reconstruction, right 08/23/2013   AC joint arthropathy 07/02/2013   Routine adult health maintenance 05/29/2013   Hypokalemia 05/21/2013   Chronic pain 04/19/2013   Acquired absence of breast and absent nipple 01/30/2013   RHINOSINUSITIS, CHRONIC 04/08/2010   Morbid obesity (HCC) 08/04/2006   DEPRESSIVE DISORDER, NOS 08/04/2006    PCP: Laymon Legions, MD  REFERRING PROVIDER: Legions Laymon PARAS, MD   REFERRING DIAG: I89.0 (ICD-10-CM) - Lymphedema of right arm   THERAPY DIAG:  Postmastectomy lymphedema  Stiffness of right shoulder, not elsewhere classified  History of right breast cancer  ONSET DATE: 2011  Rationale for Evaluation and Treatment Rehabilitation  SUBJECTIVE                                                                                                                                                                                           SUBJECTIVE STATEMENT: I think I have to sign another form to get my garments.   PERTINENT HISTORY:  Right breast cancer with mastectomy 2011 and ALND 22 nodes; reconstruction; left breast reduction; h/o right UE lymphedema and right upper quadrant pain, treated in this clinic several times before.  PAIN:  Are you having pain?No not right now  PRECAUTIONS: Other: R UE lymphedema, has metal in ankle  RED FLAGS: none  WEIGHT BEARING RESTRICTIONS No  FALLS:  Has patient fallen in last 6 months? No  LIVING ENVIRONMENT: Lives with: lives with 3 sons Lives in: House/apartment Stairs: No;  Has following equipment at home: None  OCCUPATION: on disability  LEISURE: trying to  exercise by walking but has been bothered by her L ankle pain  HAND DOMINANCE : right   PRIOR LEVEL OF FUNCTION: Independent  PATIENT GOALS to get my arm down and try  to maintain it   OBJECTIVE  COGNITION:  Overall cognitive status: Within functional limits for tasks assessed    OBSERVATIONS / OTHER ASSESSMENTS: R upper arm considerable larger than left, area of swelling at posterior trunk just above band on bra  POSTURE: forward head, rounded shoulders  UPPER EXTREMITY AROM/PROM:  A/PROM RIGHT   eval  Right 11/18/23 Right  11/23/23 12/02/23  Shoulder extension 55     Shoulder flexion 133 142 145 145  Shoulder abduction 85 102 106 162  Shoulder internal rotation 40     Shoulder external rotation 60       (Blank rows = not tested)  A/PROM LEFT   eval  Shoulder extension 63  Shoulder flexion 175  Shoulder abduction 171  Shoulder internal rotation 64  Shoulder external rotation 95    (Blank rows = not tested)   LYMPHEDEMA ASSESSMENTS:   SURGERY TYPE/DATE: R mastectomy and ALND in 2011  NUMBER OF LYMPH NODES REMOVED: 1/20  CHEMOTHERAPY: completed  RADIATION:completed Jan 2012  HORMONE TREATMENT: anastrozole  from Nov 2016 to Dec 2019  INFECTIONS: none  LYMPHEDEMA ASSESSMENTS:   LANDMARK RIGHT  Eval on 03/16/22 10/25/23 11/11/23 11/16/23 11/23/23  10 cm proximal to olecranon process 47.1 50.7 47.2 47.4 47.1  Olecranon process 32.6 34.5 34 34.5 34.1  10 cm proximal to ulnar styloid process 23.6 26 26.4 26.5 25.4  Just proximal to ulnar styloid process 17.7 18 17.7 17.3 17.4  Across hand at thumb web space 21.4 22 20.2 20.2 20.3  At base of 2nd digit 6.1 6.2 5.8 6.1 5.8  (Blank rows = not tested)  LANDMARK LEFT  Eval on 03/16/22 10/25/23  10 cm proximal to olecranon process 43 46  Olecranon process 30 32.4  10 cm proximal to ulnar styloid process 24.4 26.1  Just proximal to ulnar styloid process 17.3 18.6  Across hand at thumb web space 20.6 22.1  At base of  2nd digit 6.4 6.4  (Blank rows = not tested)   Flowsheet Row Outpatient Rehab from 10/25/2023 in [facility]  Lymphedema Life Impact Scale Total Score 72.06 %      TODAY'S TREATMENT  12/08/23 Therapeutic Exercises  Pulleys into flex and abd x 2 mins each with VC's to decrease Rt scapular compensation Roll yellow ball up wall into flexion x 10 and 10 reps in to abduction on R Manual Therapy In supine: Short neck, 5 diaphragmatic breaths , Lt axillary and pectoral nodes, establishment of anterior inter-axillary pathway, Rt inguinal nodes and establishment of Rt axillo-inguinal pathway, then R UE working proximal to distal, moving inner upper arm outwards and upwards, and doing both sides of forearms, then retracing all steps Compression Bandaging to Rt UE: Cocoa butter, TG soft, artiflex to antecubital fossa, fixated 1/4 foam to forearm and then upper arm with Idealbinde, Molelast to fingers 1-4, artiflex to dorsal hand/wrist, then short stretch compression bandages as follows: 1 - 6 cm to hand/wrist with 1/2 gray foam to dorsal hand, then 1- 8 cm herring bone fashion from wrist to elbow, then 1 - 10 cm spiral with X at elbow, and then 1 - 12 cm spiral from wrist to axilla. Folded TG soft over top of bandage.   12/05/23 Rechecked goals and extended POC Therapeutic Exercises  Pulleys into flex and abd x 3 mins each with VC's to decrease Rt scapular compensation Roll yellow ball up wall into flexion x 10  Manual Therapy In supine: Short neck, 5 diaphragmatic breaths ,  Lt axillary and pectoral nodes, establishment of anterior inter-axillary pathway, Rt inguinal nodes and establishment of Rt axillo-inguinal pathway, then R UE working proximal to distal, moving inner upper arm outwards and upwards, and doing both sides of forearms, then retracing all steps Compression Bandaging to Rt UE: Cocoa butter, TG soft, artiflex to antecubital fossa, fixated 1/4 foam to forearm  and then upper arm with Idealbinde, Molelast to fingers 1-4, artiflex to dorsal hand/wrist, then short stretch compression bandages as follows: 1 - 6 cm to hand/wrist with 1/2 gray foam to dorsal hand, then 1- 8 cm herring bone fashion from wrist to elbow, then 1 - 10 cm spiral with X at elbow, and then 1 - 12 cm spiral from wrist to axilla. Folded TG soft over top of bandage.   12/02/23: Therapeutic Exercises  Pulleys into flex and abd x 3 mins each with VC's to decrease Rt scapular compensation Roll yellow ball up wall into flexion x 10  Manual Therapy In supine: Short neck, 5 diaphragmatic breaths , Lt axillary and pectoral nodes, establishment of anterior inter-axillary pathway, Rt inguinal nodes and establishment of Rt axillo-inguinal pathway, then R UE working proximal to distal, moving inner upper arm outwards and upwards, and doing both sides of forearms, then retracing all steps Compression Bandaging to Rt UE: Cocoa butter, TG soft, artiflex to antecubital fossa, fixated 1/4 foam to forearm and then upper arm with Idealbinde, Molelast to fingers 1-4, artiflex to dorsal hand/wrist, then short stretch compression bandages as follows: 1 - 6 cm to hand/wrist with 1/2 gray foam to dorsal hand, then 1- 8 cm herring bone fashion from wrist to elbow, then 1 - 10 cm spiral with X at elbow, and then 1 - 12 cm spiral from wrist to axilla. Folded TG soft over top of bandage.   11/30/23: Therapeutic Exercises  Pulleys into flex and abd x 3 mins each with VC's to decrease Rt scapular compensation Roll yellow ball up wall into flexion x 10  Therapeutic Activities FreeMotion Machine for Rt UE ext with 7# and arm of machine on #4 with focus on decreasing scap compensation and engaging lateral trunk as able due to lat flap Manual Therapy In supine: Short neck, 5 diaphragmatic breaths , Lt axillary and pectoral nodes, establishment of anterior inter-axillary pathway, Rt inguinal nodes and establishment  of Rt axillo-inguinal pathway, then R UE working proximal to distal, moving inner upper arm outwards and upwards, and doing both sides of forearms, then retracing all steps Compression Bandaging to Rt UE: Cocoa butter, TG soft, artiflex to antecubital fossa, fixated 1/4 foam to forearm and then upper arm with Idealbinde, Molelast to fingers 1-4, artiflex to dorsal hand/wrist, then short stretch compression bandages as follows: 1 - 6 cm to hand/wrist with 1/2 gray foam to dorsal hand, then 1- 8 cm herring bone fashion from wrist to elbow, then 1 - 10 cm spiral with X at elbow, and then 1 - 12 cm spiral from wrist to axilla. Folded TG soft over top of bandage.    PATIENT EDUCATION:  Education details: Rt lateral trunk/UE stretch and Rt postural strength [place] educated: Patient Education method: Explanation Education comprehension: verbalized understanding   HOME EXERCISE PROGRAM: None yet  ASSESSMENT:  CLINICAL IMPRESSION: Continued with AAROM exercises to improve ROM. Continued with MLD and compression bandaging while pt awaits compression garments for independent management.   OBJECTIVE IMPAIRMENTS decreased ROM and increased edema.   ACTIVITY LIMITATIONS carrying, lifting, and reach over head  PARTICIPATION  LIMITATIONS: none  PERSONAL FACTORS Time since onset of injury/illness/exacerbation are also affecting patient's functional outcome.   REHAB POTENTIAL: Good  CLINICAL DECISION MAKING: Stable/uncomplicated  EVALUATION COMPLEXITY: Low  GOALS: Goals reviewed with patient? Yes  SHORT TERM GOALS: Target date: 11/15/23   Pt will demonstrate a 2 cm reduction in edema at 10 cm proximal to olecranon process.  Baseline: 50.7 cm; 11/23/23 - 47.1 (3.6 cm reduction) Goal status: MET  2.  Pt will demonstrate 110 degrees of R shoulder abduction to allow pt to reach out to the side.  Baseline: 85; 11/23/23 - 106 Goal status: MET  3.  Pt will obtain a compression bra for long term  management of truncal edema especially posterior..  Baseline:  Goal status: MET  4.  Pt will demonstrate 150 degrees of R shoulder flexion to allow her to reach overhead.  Baseline: 133; 11/23/23 - 145 degrees, 12/05/23 - 145 deg  Goal status: ONGOING   LONG TERM GOALS: Target date: 01/02/24    Pt will demonstrate a 3 cm reduction in edema at 10 cm proximal to olecranon process.  Baseline: 50.7 cm; 11/23/23 - 47.1 cm (3.6 cm reduction) Goal status: MET  2.  Pt will demonstrate 165 degrees of R shoulder abduction to allow pt to reach out to the side.  Baseline: 85 degrees; 11/23/23 - 106 degrees, 12/05/23 - 165 Goal status: MET  3.  Pt will obtain appropriate day time and night time garments for long term management of R UE lymphedema.  Baseline: Needs new garments; 11/23/23 - pt was just measured today for new day and nighttime garments Goal status: PARTIALLY MET  4.  Pt will be able to independently manage her lymphedema through self MLD and compression garments.  Baseline: Pt is independent with self MLD and has been measured for compression garments. Goal status: PARTIALLY MET  5.  Pt will demonstrate 165 degrees of R shoulder flexion to allow her to reach overhead.  Baseline: 13 degrees; 11/23/23 - 145 degrees Goal status: ONGOING   PLAN: PT FREQUENCY: 3x/week  PT DURATION: 6 weeks  PLANNED INTERVENTIONS: Therapeutic exercises, Therapeutic activity, Patient/Family education, Self Care, Joint mobilization, Orthotic/Fit training, Manual lymph drainage, Compression bandaging, Vasopneumatic device, Manual therapy, and Re-evaluation  PLAN FOR NEXT SESSION:  Cont CDT for Rt UE until new garments arrive; review new HEP and progress as able   [employer], PT 12/08/2023, 2:00 PM   (Picture did not copy but they did print for pt) Lateral Trunk    Stand in doorway reaching overhead to the left and grab doorframe with Right hand and then lean left shoulder in until stretch  felt along right trunk. Hold _10-20__ seconds. Do __3-5___ repetitions.   Lat Pull Down    Face anchor with knees slightly flexed and tummy tight. Palm down, pull arm down to side. Focus on slow and controlled motion. Keep elbow straight and shoulder down throughout up and down motions. Limit motion to where you can control the shoulder.  Repeat _10-20_ times per set. Do _2-3_ sets per day.  Anchor Height: Over Head   Cancer Rehab [phone]

## 2023-12-12 ENCOUNTER — Ambulatory Visit

## 2023-12-12 DIAGNOSIS — M25611 Stiffness of right shoulder, not elsewhere classified: Secondary | ICD-10-CM

## 2023-12-12 DIAGNOSIS — I972 Postmastectomy lymphedema syndrome: Secondary | ICD-10-CM | POA: Diagnosis not present

## 2023-12-12 DIAGNOSIS — Z853 Personal history of malignant neoplasm of breast: Secondary | ICD-10-CM | POA: Diagnosis not present

## 2023-12-12 NOTE — Therapy (Signed)
 OUTPATIENT PHYSICAL THERAPY ONCOLOGY TREATMENT     Patient Name: Karen Hopkins MRN: [id number] DOB:April 21, 1976, 48 y.o., female Today's Date: 12/12/2023   PT End of Session - 12/12/23 1417     Visit Number 17    Number of Visits 27    Date for PT Re-Evaluation 01/02/24    PT Start Time 1416   pt arrived late   PT Stop Time 1507    PT Time Calculation (min) 51 min    Activity Tolerance Patient tolerated treatment well    Behavior During Therapy [facility] for tasks assessed/performed             Past Medical History:  Diagnosis Date   Anxiety    Arthritis    Cancer (HCC) 2011   Rt. Br. Ca   Depression    Diabetes mellitus without complication (HCC)    has been borderline   Essential hypertension 11/05/2015   H. pylori infection    History of radiation therapy    Hypertension    under control with med., has been on med. x 2 yr.   Lymphedema of arm    right; no BP or puncture to right arm   Personal history of chemotherapy 09/16/2009   rt breast   Pneumonia    Seasonal allergies    Sinus headache    Past Surgical History:  Procedure Laterality Date   BREAST BIOPSY Right 08/26/2009   BREAST REDUCTION SURGERY Left 08/23/2013   Procedure: LEFT BREAST REDUCTION  ;  Surgeon: Estefana Reichert, DO;  Location: [facility];  Service: Plastics;  Laterality: Left;   BREAST REDUCTION WITH MASTOPEXY Left 08/22/2019   Procedure: Left breast mastopexy reduction with liposuction for breast symmetry;  Surgeon: Lowery Estefana RAMAN, DO;  Location: [facility];  Service: Plastics;  Laterality: Left;   CESAREAN SECTION  07/15/2001; 03/18/2004; 11/21/2008   lateral orbiotomy Left 11/2015   Dr. Melba at [facility]   LATISSIMUS FLAP TO BREAST Right 03/12/2013   Procedure: RIGHT BREAST LATISSIMUS FLAP WITH EXPANDER PLACEMENT;  Surgeon: Estefana Reichert, DO;  Location: [facility];  Service: Plastics;  Laterality: Right;   LIPOSUCTION Bilateral 08/23/2013   Procedure: LIPOSUCTION;   Surgeon: Estefana Reichert, DO;  Location: [facility];  Service: Plastics;  Laterality: Bilateral;   LIPOSUCTION WITH LIPOFILLING Left 08/22/2019   Procedure: LIPOSUCTION WITH LIPOFILLING;  Surgeon: Lowery Estefana RAMAN, DO;  Location: [facility];  Service: Plastics;  Laterality: Left;   MASTECTOMY Right 2011   MODIFIED RADICAL MASTECTOMY Right 03/11/2010   NASAL SEPTUM SURGERY     ORIF ANKLE FRACTURE Left 07/18/2018   Procedure: OPEN REDUCTION INTERNAL FIXATION (ORIF) LEFT ANKLE FRACTURE WITH  SYNDESMOSIS AND ANKLE ARTHROTOMY;  Surgeon: Elsa Lonni SAUNDERS, MD;  Location: [facility];  Service: Orthopedics;  Laterality: Left;   PORT-A-CATH REMOVAL Left 12/01/2010   PORTACATH PLACEMENT Left 09/12/2009   REDUCTION MAMMAPLASTY Left 08/2013   REMOVAL OF TISSUE EXPANDER AND PLACEMENT OF IMPLANT Right 08/23/2013   Procedure: REMOVAL RIGHT TISSUE EXPANDER AND PLACEMENT OF IMPLANT TO RIGHT BREAST ;  Surgeon: Estefana Reichert, DO;  Location: [facility];  Service: Plastics;  Laterality: Right;   SUPRA-UMBILICAL HERNIA  2006   TUBAL LIGATION  11/21/2008   UMBILICAL HERNIA REPAIR  04/12/2022   CCS DR   Patient Active Problem List   Diagnosis Date Noted   Contraceptive, surveillance, intrauterine device 04/13/2023   Nephrolithiasis 10/21/2022   H. pylori infection 09/25/2022  Abdominal hernia 09/25/2022   Seasonal allergies 09/25/2022   Carpal tunnel syndrome 10/07/2021   Genetic testing 08/14/2019   Breast asymmetry following reconstructive surgery 07/13/2019   Family history of breast cancer    Family history of prostate cancer    Unintentional weight loss 05/21/2019   Lymphedema of right arm 02/27/2018   Dyspnea 09/23/2016   Eczema 08/31/2016   Essential hypertension 11/05/2015   Cervical dystonia 08/25/2015   Headache 07/09/2015   Myofascial muscle pain 04/29/2015   Adhesive capsulitis of right shoulder 04/29/2015   Lower extremity  edema 10/01/2014   Malignant neoplasm of overlapping sites of right breast in female, estrogen receptor positive (HCC) 05/24/2014   Myalgia and myositis 05/17/2014   Neoplasm related pain 03/12/2014   Constipation 11/26/2013   H/O reduction mammoplasty 10/30/2013   Seizure (HCC) 10/06/2013   Status post bilateral breast implants 08/31/2013   S/P breast reconstruction, right 08/23/2013   AC joint arthropathy 07/02/2013   Routine adult health maintenance 05/29/2013   Hypokalemia 05/21/2013   Chronic pain 04/19/2013   Acquired absence of breast and absent nipple 01/30/2013   RHINOSINUSITIS, CHRONIC 04/08/2010   Morbid obesity (HCC) 08/04/2006   DEPRESSIVE DISORDER, NOS 08/04/2006    PCP: Laymon Legions, MD  REFERRING PROVIDER: Legions Laymon PARAS, MD   REFERRING DIAG: I89.0 (ICD-10-CM) - Lymphedema of right arm   THERAPY DIAG:  Postmastectomy lymphedema  Stiffness of right shoulder, not elsewhere classified  History of right breast cancer  ONSET DATE: 2011  Rationale for Evaluation and Treatment Rehabilitation  SUBJECTIVE                                                                                                                                                                                           SUBJECTIVE STATEMENT: I haven't heard back about the form. Can you look into it?    PERTINENT HISTORY:  Right breast cancer with mastectomy 2011 and ALND 22 nodes; reconstruction; left breast reduction; h/o right UE lymphedema and right upper quadrant pain, treated in this clinic several times before.  PAIN:  Are you having pain?No not right now  PRECAUTIONS: Other: R UE lymphedema, has metal in ankle  RED FLAGS: none  WEIGHT BEARING RESTRICTIONS No  FALLS:  Has patient fallen in last 6 months? No  LIVING ENVIRONMENT: Lives with: lives with 3 sons Lives in: House/apartment Stairs: No;  Has following equipment at home: None  OCCUPATION: on  disability  LEISURE: trying to exercise by walking but has been bothered by her L ankle pain  HAND DOMINANCE : right   PRIOR LEVEL OF FUNCTION: Independent  PATIENT GOALS to  get my arm down and try to maintain it   OBJECTIVE  COGNITION:  Overall cognitive status: Within functional limits for tasks assessed    OBSERVATIONS / OTHER ASSESSMENTS: R upper arm considerable larger than left, area of swelling at posterior trunk just above band on bra  POSTURE: forward head, rounded shoulders  UPPER EXTREMITY AROM/PROM:  A/PROM RIGHT   eval  Right 11/18/23 Right  11/23/23 12/02/23  Shoulder extension 55     Shoulder flexion 133 142 145 145  Shoulder abduction 85 102 106 162  Shoulder internal rotation 40     Shoulder external rotation 60       (Blank rows = not tested)  A/PROM LEFT   eval  Shoulder extension 63  Shoulder flexion 175  Shoulder abduction 171  Shoulder internal rotation 64  Shoulder external rotation 95    (Blank rows = not tested)   LYMPHEDEMA ASSESSMENTS:   SURGERY TYPE/DATE: R mastectomy and ALND in 2011  NUMBER OF LYMPH NODES REMOVED: 1/20  CHEMOTHERAPY: completed  RADIATION:completed Jan 2012  HORMONE TREATMENT: anastrozole  from Nov 2016 to Dec 2019  INFECTIONS: none  LYMPHEDEMA ASSESSMENTS:   LANDMARK RIGHT  Eval on 03/16/22 10/25/23 11/11/23 11/16/23 11/23/23  10 cm proximal to olecranon process 47.1 50.7 47.2 47.4 47.1  Olecranon process 32.6 34.5 34 34.5 34.1  10 cm proximal to ulnar styloid process 23.6 26 26.4 26.5 25.4  Just proximal to ulnar styloid process 17.7 18 17.7 17.3 17.4  Across hand at thumb web space 21.4 22 20.2 20.2 20.3  At base of 2nd digit 6.1 6.2 5.8 6.1 5.8  (Blank rows = not tested)  LANDMARK LEFT  Eval on 03/16/22 10/25/23  10 cm proximal to olecranon process 43 46  Olecranon process 30 32.4  10 cm proximal to ulnar styloid process 24.4 26.1  Just proximal to ulnar styloid process 17.3 18.6  Across hand at thumb  web space 20.6 22.1  At base of 2nd digit 6.4 6.4  (Blank rows = not tested)   Flowsheet Row Outpatient Rehab from 10/25/2023 in [facility]  Lymphedema Life Impact Scale Total Score 72.06 %      TODAY'S TREATMENT  12/12/23: Self Care Spent beginning of session calling [name] to see if they got signed script back from Dr. Beverley and they have. So they are finalizing placing of order today.  Therapeutic Exercises  Pulleys into flex and abd x 2 mins each with VC's to decrease Rt scapular compensation Free Motion Machine for Rt UE ext starting at [facility] position: started with 10# but pt unable to control compensations so decreased weight to 7# and no problems here, x 20 reps Manual Therapy Did not have time for MLD today as pt arrived late and she wanted to review her HEP shoulder ext P/ROM to R shoulder with scapular depression by therapist during STM trigger points in Rt UT and Rt lats Compression Bandaging to Rt UE as follows: Cocoa butter, TG soft, artiflex to antecubital fossa, fixated 1/4 foam to forearm and then upper arm with Idealbinde, Molelast to fingers 1-4, artiflex to dorsal hand/wrist, then short stretch compression bandages as follows: 1 - 6 cm to hand/wrist with 1/2 gray foam to dorsal hand, then 1- 8 cm herring bone fashion from wrist to above elbow, then 1 - 10 cm spiral with X at elbow, and then 1 - 12 cm spiral from wrist to axilla. Folded TG soft over top of bandage.  12/08/23  Therapeutic Exercises  Pulleys into flex and abd x 2 mins each with VC's to decrease Rt scapular compensation Roll yellow ball up wall into flexion x 10 and 10 reps in to abduction on R Manual Therapy In supine: Short neck, 5 diaphragmatic breaths , Lt axillary and pectoral nodes, establishment of anterior inter-axillary pathway, Rt inguinal nodes and establishment of Rt axillo-inguinal pathway, then R UE working proximal to distal, moving inner upper arm outwards  and upwards, and doing both sides of forearms, then retracing all steps Compression Bandaging to Rt UE: Cocoa butter, TG soft, artiflex to antecubital fossa, fixated 1/4 foam to forearm and then upper arm with Idealbinde, Molelast to fingers 1-4, artiflex to dorsal hand/wrist, then short stretch compression bandages as follows: 1 - 6 cm to hand/wrist with 1/2 gray foam to dorsal hand, then 1- 8 cm herring bone fashion from wrist to elbow, then 1 - 10 cm spiral with X at elbow, and then 1 - 12 cm spiral from wrist to axilla. Folded TG soft over top of bandage.   12/05/23 Rechecked goals and extended POC Therapeutic Exercises  Pulleys into flex and abd x 3 mins each with VC's to decrease Rt scapular compensation Roll yellow ball up wall into flexion x 10  Manual Therapy In supine: Short neck, 5 diaphragmatic breaths , Lt axillary and pectoral nodes, establishment of anterior inter-axillary pathway, Rt inguinal nodes and establishment of Rt axillo-inguinal pathway, then R UE working proximal to distal, moving inner upper arm outwards and upwards, and doing both sides of forearms, then retracing all steps Compression Bandaging to Rt UE: Cocoa butter, TG soft, artiflex to antecubital fossa, fixated 1/4 foam to forearm and then upper arm with Idealbinde, Molelast to fingers 1-4, artiflex to dorsal hand/wrist, then short stretch compression bandages as follows: 1 - 6 cm to hand/wrist with 1/2 gray foam to dorsal hand, then 1- 8 cm herring bone fashion from wrist to elbow, then 1 - 10 cm spiral with X at elbow, and then 1 - 12 cm spiral from wrist to axilla. Folded TG soft over top of bandage.   12/02/23: Therapeutic Exercises  Pulleys into flex and abd x 3 mins each with VC's to decrease Rt scapular compensation Roll yellow ball up wall into flexion x 10  Manual Therapy In supine: Short neck, 5 diaphragmatic breaths , Lt axillary and pectoral nodes, establishment of anterior inter-axillary pathway,  Rt inguinal nodes and establishment of Rt axillo-inguinal pathway, then R UE working proximal to distal, moving inner upper arm outwards and upwards, and doing both sides of forearms, then retracing all steps Compression Bandaging to Rt UE: Cocoa butter, TG soft, artiflex to antecubital fossa, fixated 1/4 foam to forearm and then upper arm with Idealbinde, Molelast to fingers 1-4, artiflex to dorsal hand/wrist, then short stretch compression bandages as follows: 1 - 6 cm to hand/wrist with 1/2 gray foam to dorsal hand, then 1- 8 cm herring bone fashion from wrist to elbow, then 1 - 10 cm spiral with X at elbow, and then 1 - 12 cm spiral from wrist to axilla. Folded TG soft over top of bandage.   11/30/23: Therapeutic Exercises  Pulleys into flex and abd x 3 mins each with VC's to decrease Rt scapular compensation Roll yellow ball up wall into flexion x 10  Therapeutic Activities FreeMotion Machine for Rt UE ext with 7# and arm of machine on #4 with focus on decreasing scap compensation and engaging lateral trunk as able due  to lat flap Manual Therapy In supine: Short neck, 5 diaphragmatic breaths , Lt axillary and pectoral nodes, establishment of anterior inter-axillary pathway, Rt inguinal nodes and establishment of Rt axillo-inguinal pathway, then R UE working proximal to distal, moving inner upper arm outwards and upwards, and doing both sides of forearms, then retracing all steps Compression Bandaging to Rt UE: Cocoa butter, TG soft, artiflex to antecubital fossa, fixated 1/4 foam to forearm and then upper arm with Idealbinde, Molelast to fingers 1-4, artiflex to dorsal hand/wrist, then short stretch compression bandages as follows: 1 - 6 cm to hand/wrist with 1/2 gray foam to dorsal hand, then 1- 8 cm herring bone fashion from wrist to elbow, then 1 - 10 cm spiral with X at elbow, and then 1 - 12 cm spiral from wrist to axilla. Folded TG soft over top of bandage.    PATIENT EDUCATION:   Education details: Rt lateral trunk/UE stretch and Rt postural strength Sison educated: Patient Education method: Explanation Education comprehension: verbalized understanding   HOME EXERCISE PROGRAM: None yet  ASSESSMENT:  CLINICAL IMPRESSION: Was able to hear back from [facility] that they have signed script back from doctor so they will be finalizing pts order of new compression garments today. She was running late due to her son was having car trouble so abbreviated session of TE and MT, see above.   OBJECTIVE IMPAIRMENTS decreased ROM and increased edema.   ACTIVITY LIMITATIONS carrying, lifting, and reach over head  PARTICIPATION LIMITATIONS: none  PERSONAL FACTORS Time since onset of injury/illness/exacerbation are also affecting patient's functional outcome.   REHAB POTENTIAL: Good  CLINICAL DECISION MAKING: Stable/uncomplicated  EVALUATION COMPLEXITY: Low  GOALS: Goals reviewed with patient? Yes  SHORT TERM GOALS: Target date: 11/15/23   Pt will demonstrate a 2 cm reduction in edema at 10 cm proximal to olecranon process.  Baseline: 50.7 cm; 11/23/23 - 47.1 (3.6 cm reduction) Goal status: MET  2.  Pt will demonstrate 110 degrees of R shoulder abduction to allow pt to reach out to the side.  Baseline: 85; 11/23/23 - 106 Goal status: MET  3.  Pt will obtain a compression bra for long term management of truncal edema especially posterior..  Baseline:  Goal status: MET  4.  Pt will demonstrate 150 degrees of R shoulder flexion to allow her to reach overhead.  Baseline: 133; 11/23/23 - 145 degrees, 12/05/23 - 145 deg  Goal status: ONGOING   LONG TERM GOALS: Target date: 01/02/24    Pt will demonstrate a 3 cm reduction in edema at 10 cm proximal to olecranon process.  Baseline: 50.7 cm; 11/23/23 - 47.1 cm (3.6 cm reduction) Goal status: MET  2.  Pt will demonstrate 165 degrees of R shoulder abduction to allow pt to reach out to the side.  Baseline: 85 degrees;  11/23/23 - 106 degrees, 12/05/23 - 165 Goal status: MET  3.  Pt will obtain appropriate day time and night time garments for long term management of R UE lymphedema.  Baseline: Needs new garments; 11/23/23 - pt was just measured today for new day and nighttime garments Goal status: PARTIALLY MET  4.  Pt will be able to independently manage her lymphedema through self MLD and compression garments.  Baseline: Pt is independent with self MLD and has been measured for compression garments. Goal status: PARTIALLY MET  5.  Pt will demonstrate 165 degrees of R shoulder flexion to allow her to reach overhead.  Baseline: 13 degrees; 11/23/23 - 145 degrees  Goal status: ONGOING   PLAN: PT FREQUENCY: 3x/week  PT DURATION: 6 weeks  PLANNED INTERVENTIONS: Therapeutic exercises, Therapeutic activity, Patient/Family education, Self Care, Joint mobilization, Orthotic/Fit training, Manual lymph drainage, Compression bandaging, Vasopneumatic device, Manual therapy, and Re-evaluation  PLAN FOR NEXT SESSION:  Cont CDT for Rt UE until new garments arrive now that they've been ordered; review new HEP and progress as able   Aden Berwyn Caldron, PTA 12/12/2023, 3:18 PM   (Picture did not copy but they did print for pt) Lateral Trunk    Stand in doorway reaching overhead to the left and grab doorframe with Right hand and then lean left shoulder in until stretch felt along right trunk. Hold _10-20__ seconds. Do __3-5___ repetitions.   Lat Pull Down    Face anchor with knees slightly flexed and tummy tight. Palm down, pull arm down to side. Focus on slow and controlled motion. Keep elbow straight and shoulder down throughout up and down motions. Limit motion to where you can control the shoulder.  Repeat _10-20_ times per set. Do _2-3_ sets per day.  Anchor Height: Over Head   Cancer Rehab [phone]

## 2023-12-14 ENCOUNTER — Ambulatory Visit: Admitting: Physical Therapy

## 2023-12-14 ENCOUNTER — Encounter: Payer: Self-pay | Admitting: Physical Therapy

## 2023-12-14 DIAGNOSIS — I972 Postmastectomy lymphedema syndrome: Secondary | ICD-10-CM

## 2023-12-14 DIAGNOSIS — Z853 Personal history of malignant neoplasm of breast: Secondary | ICD-10-CM

## 2023-12-14 DIAGNOSIS — M25611 Stiffness of right shoulder, not elsewhere classified: Secondary | ICD-10-CM

## 2023-12-14 NOTE — Therapy (Signed)
 OUTPATIENT PHYSICAL THERAPY ONCOLOGY TREATMENT     Patient Name: Karen Hopkins MRN: [id number] DOB:August 22, 1975, 48 y.o., female Today's Date: 12/14/2023   PT End of Session - 12/14/23 1154     Visit Number 18    Number of Visits 27    Date for PT Re-Evaluation 01/02/24    PT Start Time 1058    PT Stop Time 1155    PT Time Calculation (min) 57 min    Activity Tolerance Patient tolerated treatment well    Behavior During Therapy [facility] for tasks assessed/performed             Past Medical History:  Diagnosis Date   Anxiety    Arthritis    Cancer (HCC) 2011   Rt. Br. Ca   Depression    Diabetes mellitus without complication (HCC)    has been borderline   Essential hypertension 11/05/2015   H. pylori infection    History of radiation therapy    Hypertension    under control with med., has been on med. x 2 yr.   Lymphedema of arm    right; no BP or puncture to right arm   Personal history of chemotherapy 09/16/2009   rt breast   Pneumonia    Seasonal allergies    Sinus headache    Past Surgical History:  Procedure Laterality Date   BREAST BIOPSY Right 08/26/2009   BREAST REDUCTION SURGERY Left 08/23/2013   Procedure: LEFT BREAST REDUCTION  ;  Surgeon: Karen Reichert, DO;  Location: [facility];  Service: Plastics;  Laterality: Left;   BREAST REDUCTION WITH MASTOPEXY Left 08/22/2019   Procedure: Left breast mastopexy reduction with liposuction for breast symmetry;  Surgeon: Karen Karen RAMAN, DO;  Location: [facility];  Service: Plastics;  Laterality: Left;   CESAREAN SECTION  07/15/2001; 03/18/2004; 11/21/2008   lateral orbiotomy Left 11/2015   Dr. Melba at [facility]   LATISSIMUS FLAP TO BREAST Right 03/12/2013   Procedure: RIGHT BREAST LATISSIMUS FLAP WITH EXPANDER PLACEMENT;  Surgeon: Karen Reichert, DO;  Location: [facility];  Service: Plastics;  Laterality: Right;   LIPOSUCTION Bilateral 08/23/2013   Procedure: LIPOSUCTION;  Surgeon: Karen Reichert, DO;  Location: [facility];  Service: Plastics;  Laterality: Bilateral;   LIPOSUCTION WITH LIPOFILLING Left 08/22/2019   Procedure: LIPOSUCTION WITH LIPOFILLING;  Surgeon: Karen Karen RAMAN, DO;  Location: [facility];  Service: Plastics;  Laterality: Left;   MASTECTOMY Right 2011   MODIFIED RADICAL MASTECTOMY Right 03/11/2010   NASAL SEPTUM SURGERY     ORIF ANKLE FRACTURE Left 07/18/2018   Procedure: OPEN REDUCTION INTERNAL FIXATION (ORIF) LEFT ANKLE FRACTURE WITH  SYNDESMOSIS AND ANKLE ARTHROTOMY;  Surgeon: Karen Lonni SAUNDERS, MD;  Location: [facility];  Service: Orthopedics;  Laterality: Left;   PORT-A-CATH REMOVAL Left 12/01/2010   PORTACATH PLACEMENT Left 09/12/2009   REDUCTION MAMMAPLASTY Left 08/2013   REMOVAL OF TISSUE EXPANDER AND PLACEMENT OF IMPLANT Right 08/23/2013   Procedure: REMOVAL RIGHT TISSUE EXPANDER AND PLACEMENT OF IMPLANT TO RIGHT BREAST ;  Surgeon: Karen Reichert, DO;  Location: [facility];  Service: Plastics;  Laterality: Right;   SUPRA-UMBILICAL HERNIA  2006   TUBAL LIGATION  11/21/2008   UMBILICAL HERNIA REPAIR  04/12/2022   CCS DR   Patient Active Problem List   Diagnosis Date Noted   Contraceptive, surveillance, intrauterine device 04/13/2023   Nephrolithiasis 10/21/2022   H. pylori infection 09/25/2022   Abdominal hernia 09/25/2022  Seasonal allergies 09/25/2022   Carpal tunnel syndrome 10/07/2021   Genetic testing 08/14/2019   Breast asymmetry following reconstructive surgery 07/13/2019   Family history of breast cancer    Family history of prostate cancer    Unintentional weight loss 05/21/2019   Lymphedema of right arm 02/27/2018   Dyspnea 09/23/2016   Eczema 08/31/2016   Essential hypertension 11/05/2015   Cervical dystonia 08/25/2015   Headache 07/09/2015   Myofascial muscle pain 04/29/2015   Adhesive capsulitis of right shoulder 04/29/2015   Lower extremity edema 10/01/2014    Malignant neoplasm of overlapping sites of right breast in female, estrogen receptor positive (HCC) 05/24/2014   Myalgia and myositis 05/17/2014   Neoplasm related pain 03/12/2014   Constipation 11/26/2013   H/O reduction mammoplasty 10/30/2013   Seizure (HCC) 10/06/2013   Status post bilateral breast implants 08/31/2013   S/P breast reconstruction, right 08/23/2013   AC joint arthropathy 07/02/2013   Routine adult health maintenance 05/29/2013   Hypokalemia 05/21/2013   Chronic pain 04/19/2013   Acquired absence of breast and absent nipple 01/30/2013   RHINOSINUSITIS, CHRONIC 04/08/2010   Morbid obesity (HCC) 08/04/2006   DEPRESSIVE DISORDER, NOS 08/04/2006    PCP: Karen Legions, MD  REFERRING PROVIDER: Legions Karen PARAS, MD   REFERRING DIAG: I89.0 (ICD-10-CM) - Lymphedema of right arm   THERAPY DIAG:  Postmastectomy lymphedema  Stiffness of right shoulder, not elsewhere classified  History of right breast cancer  ONSET DATE: 2011  Rationale for Evaluation and Treatment Rehabilitation  SUBJECTIVE                                                                                                                                                                                           SUBJECTIVE STATEMENT: [name] called and asked for my address to ship the sleeve.   PERTINENT HISTORY:  Right breast cancer with mastectomy 2011 and ALND 22 nodes; reconstruction; left breast reduction; h/o right UE lymphedema and right upper quadrant pain, treated in this clinic several times before.  PAIN:  Are you having pain?No not right now  PRECAUTIONS: Other: R UE lymphedema, has metal in ankle  RED FLAGS: none  WEIGHT BEARING RESTRICTIONS No  FALLS:  Has patient fallen in last 6 months? No  LIVING ENVIRONMENT: Lives with: lives with 3 sons Lives in: House/apartment Stairs: No;  Has following equipment at home: None  OCCUPATION: on disability  LEISURE: trying to  exercise by walking but has been bothered by her L ankle pain  HAND DOMINANCE : right   PRIOR LEVEL OF FUNCTION: Independent  PATIENT GOALS to get my arm down and try to  maintain it   OBJECTIVE  COGNITION:  Overall cognitive status: Within functional limits for tasks assessed    OBSERVATIONS / OTHER ASSESSMENTS: R upper arm considerable larger than left, area of swelling at posterior trunk just above band on bra  POSTURE: forward head, rounded shoulders  UPPER EXTREMITY AROM/PROM:  A/PROM RIGHT   eval  Right 11/18/23 Right  11/23/23 12/02/23  Shoulder extension 55     Shoulder flexion 133 142 145 145  Shoulder abduction 85 102 106 162  Shoulder internal rotation 40     Shoulder external rotation 60       (Blank rows = not tested)  A/PROM LEFT   eval  Shoulder extension 63  Shoulder flexion 175  Shoulder abduction 171  Shoulder internal rotation 64  Shoulder external rotation 95    (Blank rows = not tested)   LYMPHEDEMA ASSESSMENTS:   SURGERY TYPE/DATE: R mastectomy and ALND in 2011  NUMBER OF LYMPH NODES REMOVED: 1/20  CHEMOTHERAPY: completed  RADIATION:completed Jan 2012  HORMONE TREATMENT: anastrozole  from Nov 2016 to Dec 2019  INFECTIONS: none  LYMPHEDEMA ASSESSMENTS:   LANDMARK RIGHT  Eval on 03/16/22 10/25/23 11/11/23 11/16/23 11/23/23  10 cm proximal to olecranon process 47.1 50.7 47.2 47.4 47.1  Olecranon process 32.6 34.5 34 34.5 34.1  10 cm proximal to ulnar styloid process 23.6 26 26.4 26.5 25.4  Just proximal to ulnar styloid process 17.7 18 17.7 17.3 17.4  Across hand at thumb web space 21.4 22 20.2 20.2 20.3  At base of 2nd digit 6.1 6.2 5.8 6.1 5.8  (Blank rows = not tested)  LANDMARK LEFT  Eval on 03/16/22 10/25/23  10 cm proximal to olecranon process 43 46  Olecranon process 30 32.4  10 cm proximal to ulnar styloid process 24.4 26.1  Just proximal to ulnar styloid process 17.3 18.6  Across hand at thumb web space 20.6 22.1  At base of  2nd digit 6.4 6.4  (Blank rows = not tested)   Flowsheet Row Outpatient Rehab from 10/25/2023 in [facility]  Lymphedema Life Impact Scale Total Score 72.06 %      TODAY'S TREATMENT  12/14/23: Therapeutic Exercises  Pulleys into flex and abd x 2 mins each with VC's to decrease Rt scapular compensation Manual Therapy In supine: Short neck, 5 diaphragmatic breaths, L axillary nodes and establishment of interaxillary pathway, R inguinal nodes and establishment of axilloinguinal pathway, then R UE working proximal to distal, moving inner upper arm outwards and upwards, and doing both sides of forearms, spending extra time in any areas of fibrosis then retracing all steps Compression Bandaging to Rt UE as follows: Cocoa butter, TG soft, artiflex to antecubital fossa, fixated 1/4 foam to forearm and then upper arm with Idealbinde, Molelast to fingers 1-4, artiflex to dorsal hand/wrist, then short stretch compression bandages as follows: 1 - 6 cm to hand/wrist with 1/2 gray foam to dorsal hand, then 1- 8 cm herring bone fashion from wrist to above elbow, then 1 - 10 cm spiral with X at elbow, and then 1 - 12 cm spiral from wrist to axilla. Folded TG soft over top of bandage.  12/12/23: Self Care Spent beginning of session calling [name] to see if they got signed script back from Dr. Beverley and they have. So they are finalizing placing of order today.  Therapeutic Exercises  Pulleys into flex and abd x 2 mins each with VC's to decrease Rt scapular compensation Free Motion Machine for  Rt UE ext starting at [facility] position: started with 10# but pt unable to control compensations so decreased weight to 7# and no problems here, x 20 reps Manual Therapy Did not have time for MLD today as pt arrived late and she wanted to review her HEP shoulder ext P/ROM to R shoulder with scapular depression by therapist during STM trigger points in Rt UT and Rt lats Compression  Bandaging to Rt UE as follows: Cocoa butter, TG soft, artiflex to antecubital fossa, fixated 1/4 foam to forearm and then upper arm with Idealbinde, Molelast to fingers 1-4, artiflex to dorsal hand/wrist, then short stretch compression bandages as follows: 1 - 6 cm to hand/wrist with 1/2 gray foam to dorsal hand, then 1- 8 cm herring bone fashion from wrist to above elbow, then 1 - 10 cm spiral with X at elbow, and then 1 - 12 cm spiral from wrist to axilla. Folded TG soft over top of bandage.  12/08/23 Therapeutic Exercises  Pulleys into flex and abd x 2 mins each with VC's to decrease Rt scapular compensation Roll yellow ball up wall into flexion x 10 and 10 reps in to abduction on R Manual Therapy In supine: Short neck, 5 diaphragmatic breaths , Lt axillary and pectoral nodes, establishment of anterior inter-axillary pathway, Rt inguinal nodes and establishment of Rt axillo-inguinal pathway, then R UE working proximal to distal, moving inner upper arm outwards and upwards, and doing both sides of forearms, then retracing all steps Compression Bandaging to Rt UE: Cocoa butter, TG soft, artiflex to antecubital fossa, fixated 1/4 foam to forearm and then upper arm with Idealbinde, Molelast to fingers 1-4, artiflex to dorsal hand/wrist, then short stretch compression bandages as follows: 1 - 6 cm to hand/wrist with 1/2 gray foam to dorsal hand, then 1- 8 cm herring bone fashion from wrist to elbow, then 1 - 10 cm spiral with X at elbow, and then 1 - 12 cm spiral from wrist to axilla. Folded TG soft over top of bandage.   12/05/23 Rechecked goals and extended POC Therapeutic Exercises  Pulleys into flex and abd x 3 mins each with VC's to decrease Rt scapular compensation Roll yellow ball up wall into flexion x 10  Manual Therapy In supine: Short neck, 5 diaphragmatic breaths , Lt axillary and pectoral nodes, establishment of anterior inter-axillary pathway, Rt inguinal nodes and establishment of  Rt axillo-inguinal pathway, then R UE working proximal to distal, moving inner upper arm outwards and upwards, and doing both sides of forearms, then retracing all steps Compression Bandaging to Rt UE: Cocoa butter, TG soft, artiflex to antecubital fossa, fixated 1/4 foam to forearm and then upper arm with Idealbinde, Molelast to fingers 1-4, artiflex to dorsal hand/wrist, then short stretch compression bandages as follows: 1 - 6 cm to hand/wrist with 1/2 gray foam to dorsal hand, then 1- 8 cm herring bone fashion from wrist to elbow, then 1 - 10 cm spiral with X at elbow, and then 1 - 12 cm spiral from wrist to axilla. Folded TG soft over top of bandage.   12/02/23: Therapeutic Exercises  Pulleys into flex and abd x 3 mins each with VC's to decrease Rt scapular compensation Roll yellow ball up wall into flexion x 10  Manual Therapy In supine: Short neck, 5 diaphragmatic breaths , Lt axillary and pectoral nodes, establishment of anterior inter-axillary pathway, Rt inguinal nodes and establishment of Rt axillo-inguinal pathway, then R UE working proximal to distal, moving inner upper arm  outwards and upwards, and doing both sides of forearms, then retracing all steps Compression Bandaging to Rt UE: Cocoa butter, TG soft, artiflex to antecubital fossa, fixated 1/4 foam to forearm and then upper arm with Idealbinde, Molelast to fingers 1-4, artiflex to dorsal hand/wrist, then short stretch compression bandages as follows: 1 - 6 cm to hand/wrist with 1/2 gray foam to dorsal hand, then 1- 8 cm herring bone fashion from wrist to elbow, then 1 - 10 cm spiral with X at elbow, and then 1 - 12 cm spiral from wrist to axilla. Folded TG soft over top of bandage.   11/30/23: Therapeutic Exercises  Pulleys into flex and abd x 3 mins each with VC's to decrease Rt scapular compensation Roll yellow ball up wall into flexion x 10  Therapeutic Activities FreeMotion Machine for Rt UE ext with 7# and arm of machine  on #4 with focus on decreasing scap compensation and engaging lateral trunk as able due to lat flap Manual Therapy In supine: Short neck, 5 diaphragmatic breaths , Lt axillary and pectoral nodes, establishment of anterior inter-axillary pathway, Rt inguinal nodes and establishment of Rt axillo-inguinal pathway, then R UE working proximal to distal, moving inner upper arm outwards and upwards, and doing both sides of forearms, then retracing all steps Compression Bandaging to Rt UE: Cocoa butter, TG soft, artiflex to antecubital fossa, fixated 1/4 foam to forearm and then upper arm with Idealbinde, Molelast to fingers 1-4, artiflex to dorsal hand/wrist, then short stretch compression bandages as follows: 1 - 6 cm to hand/wrist with 1/2 gray foam to dorsal hand, then 1- 8 cm herring bone fashion from wrist to elbow, then 1 - 10 cm spiral with X at elbow, and then 1 - 12 cm spiral from wrist to axilla. Folded TG soft over top of bandage.    PATIENT EDUCATION:  Education details: Rt lateral trunk/UE stretch and Rt postural strength Turrub[place] educated: Patient Education method: Explanation Education comprehension: verbalized understanding   HOME EXERCISE PROGRAM: None yet  ASSESSMENT:  CLINICAL IMPRESSION: Continued with MLD and compression bandaging today. Pt heard from [facility] and they said her sleeve would be here in 7 to 10 days.   OBJECTIVE IMPAIRMENTS decreased ROM and increased edema.   ACTIVITY LIMITATIONS carrying, lifting, and reach over head  PARTICIPATION LIMITATIONS: none  PERSONAL FACTORS Time since onset of injury/illness/exacerbation are also affecting patient's functional outcome.   REHAB POTENTIAL: Good  CLINICAL DECISION MAKING: Stable/uncomplicated  EVALUATION COMPLEXITY: Low  GOALS: Goals reviewed with patient? Yes  SHORT TERM GOALS: Target date: 11/15/23   Pt will demonstrate a 2 cm reduction in edema at 10 cm proximal to olecranon process.  Baseline: 50.7 cm;  11/23/23 - 47.1 (3.6 cm reduction) Goal status: MET  2.  Pt will demonstrate 110 degrees of R shoulder abduction to allow pt to reach out to the side.  Baseline: 85; 11/23/23 - 106 Goal status: MET  3.  Pt will obtain a compression bra for long term management of truncal edema especially posterior..  Baseline:  Goal status: MET  4.  Pt will demonstrate 150 degrees of R shoulder flexion to allow her to reach overhead.  Baseline: 133; 11/23/23 - 145 degrees, 12/05/23 - 145 deg  Goal status: ONGOING   LONG TERM GOALS: Target date: 01/02/24    Pt will demonstrate a 3 cm reduction in edema at 10 cm proximal to olecranon process.  Baseline: 50.7 cm; 11/23/23 - 47.1 cm (3.6 cm reduction) Goal status: MET  2.  Pt will demonstrate 165 degrees of R shoulder abduction to allow pt to reach out to the side.  Baseline: 85 degrees; 11/23/23 - 106 degrees, 12/05/23 - 165 Goal status: MET  3.  Pt will obtain appropriate day time and night time garments for long term management of R UE lymphedema.  Baseline: Needs new garments; 11/23/23 - pt was just measured today for new day and nighttime garments Goal status: PARTIALLY MET  4.  Pt will be able to independently manage her lymphedema through self MLD and compression garments.  Baseline: Pt is independent with self MLD and has been measured for compression garments. Goal status: PARTIALLY MET  5.  Pt will demonstrate 165 degrees of R shoulder flexion to allow her to reach overhead.  Baseline: 13 degrees; 11/23/23 - 145 degrees Goal status: ONGOING   PLAN: PT FREQUENCY: 3x/week  PT DURATION: 6 weeks  PLANNED INTERVENTIONS: Therapeutic exercises, Therapeutic activity, Patient/Family education, Self Care, Joint mobilization, Orthotic/Fit training, Manual lymph drainage, Compression bandaging, Vasopneumatic device, Manual therapy, and Re-evaluation  PLAN FOR NEXT SESSION:  Cont CDT for Rt UE until new garments arrive now that they've been ordered;  review new HEP and progress as able   [employer], PT 12/14/2023, 11:58 AM   (Picture did not copy but they did print for pt) Lateral Trunk    Stand in doorway reaching overhead to the left and grab doorframe with Right hand and then lean left shoulder in until stretch felt along right trunk. Hold _10-20__ seconds. Do __3-5___ repetitions.   Lat Pull Down    Face anchor with knees slightly flexed and tummy tight. Palm down, pull arm down to side. Focus on slow and controlled motion. Keep elbow straight and shoulder down throughout up and down motions. Limit motion to where you can control the shoulder.  Repeat _10-20_ times per set. Do _2-3_ sets per day.  Anchor Height: Over Head   Cancer Rehab [phone]

## 2023-12-16 ENCOUNTER — Ambulatory Visit

## 2023-12-16 DIAGNOSIS — Z853 Personal history of malignant neoplasm of breast: Secondary | ICD-10-CM | POA: Diagnosis not present

## 2023-12-16 DIAGNOSIS — I972 Postmastectomy lymphedema syndrome: Secondary | ICD-10-CM | POA: Diagnosis not present

## 2023-12-16 DIAGNOSIS — M25611 Stiffness of right shoulder, not elsewhere classified: Secondary | ICD-10-CM | POA: Diagnosis not present

## 2023-12-16 NOTE — Therapy (Signed)
 OUTPATIENT PHYSICAL THERAPY ONCOLOGY TREATMENT     Patient Name: Karen Hopkins MRN: [id number] DOB:Jul 07, 1975, 48 y.o., female Today's Date: 12/16/2023   PT End of Session - 12/16/23 1116     Visit Number 19    Number of Visits 27    Date for PT Re-Evaluation 01/02/24    PT Start Time 1105    PT Stop Time 1200    PT Time Calculation (min) 55 min    Activity Tolerance Patient tolerated treatment well    Behavior During Therapy Karen Hopkins for tasks assessed/performed             Past Medical History:  Diagnosis Date   Anxiety    Arthritis    Cancer (HCC) 2011   Rt. Br. Ca   Depression    Diabetes mellitus without complication (HCC)    has been borderline   Essential hypertension 11/05/2015   H. pylori infection    History of radiation therapy    Hypertension    under control with med., has been on med. x 2 yr.   Lymphedema of arm    right; no BP or puncture to right arm   Personal history of chemotherapy 09/16/2009   rt breast   Pneumonia    Seasonal allergies    Sinus headache    Past Surgical History:  Procedure Laterality Date   BREAST BIOPSY Right 08/26/2009   BREAST REDUCTION SURGERY Left 08/23/2013   Procedure: LEFT BREAST REDUCTION  ;  Surgeon: Karen Reichert, DO;  Location: [facility];  Service: Plastics;  Laterality: Left;   BREAST REDUCTION WITH MASTOPEXY Left 08/22/2019   Procedure: Left breast mastopexy reduction with liposuction for breast symmetry;  Surgeon: Karen Karen RAMAN, DO;  Location: [facility];  Service: Plastics;  Laterality: Left;   CESAREAN SECTION  07/15/2001; 03/18/2004; 11/21/2008   lateral orbiotomy Left 11/2015   Karen Hopkins at [facility]   LATISSIMUS FLAP TO BREAST Right 03/12/2013   Procedure: RIGHT BREAST LATISSIMUS FLAP WITH EXPANDER PLACEMENT;  Surgeon: Karen Reichert, DO;  Location: [facility];  Service: Plastics;  Laterality: Right;   LIPOSUCTION Bilateral 08/23/2013   Procedure: LIPOSUCTION;  Surgeon: Karen Reichert, DO;  Location: [facility];  Service: Plastics;  Laterality: Bilateral;   LIPOSUCTION WITH LIPOFILLING Left 08/22/2019   Procedure: LIPOSUCTION WITH LIPOFILLING;  Surgeon: Karen Karen RAMAN, DO;  Location: [facility];  Service: Plastics;  Laterality: Left;   MASTECTOMY Right 2011   MODIFIED RADICAL MASTECTOMY Right 03/11/2010   NASAL SEPTUM SURGERY     ORIF ANKLE FRACTURE Left 07/18/2018   Procedure: OPEN REDUCTION INTERNAL FIXATION (ORIF) LEFT ANKLE FRACTURE WITH  SYNDESMOSIS AND ANKLE ARTHROTOMY;  Surgeon: Karen Lonni SAUNDERS, MD;  Location: [facility];  Service: Orthopedics;  Laterality: Left;   PORT-A-CATH REMOVAL Left 12/01/2010   PORTACATH PLACEMENT Left 09/12/2009   REDUCTION MAMMAPLASTY Left 08/2013   REMOVAL OF TISSUE EXPANDER AND PLACEMENT OF IMPLANT Right 08/23/2013   Procedure: REMOVAL RIGHT TISSUE EXPANDER AND PLACEMENT OF IMPLANT TO RIGHT BREAST ;  Surgeon: Karen Reichert, DO;  Location: [facility];  Service: Plastics;  Laterality: Right;   SUPRA-UMBILICAL HERNIA  2006   TUBAL LIGATION  11/21/2008   UMBILICAL HERNIA REPAIR  04/12/2022   CCS DR   Patient Active Problem List   Diagnosis Date Noted   Contraceptive, surveillance, intrauterine device 04/13/2023   Nephrolithiasis 10/21/2022   H. pylori infection 09/25/2022   Abdominal hernia 09/25/2022  Seasonal allergies 09/25/2022   Carpal tunnel syndrome 10/07/2021   Genetic testing 08/14/2019   Breast asymmetry following reconstructive surgery 07/13/2019   Family history of breast cancer    Family history of prostate cancer    Unintentional weight loss 05/21/2019   Lymphedema of right arm 02/27/2018   Dyspnea 09/23/2016   Eczema 08/31/2016   Essential hypertension 11/05/2015   Cervical dystonia 08/25/2015   Headache 07/09/2015   Myofascial muscle pain 04/29/2015   Adhesive capsulitis of right shoulder 04/29/2015   Lower extremity edema 10/01/2014    Malignant neoplasm of overlapping sites of right breast in female, estrogen receptor positive (HCC) 05/24/2014   Myalgia and myositis 05/17/2014   Neoplasm related pain 03/12/2014   Constipation 11/26/2013   H/O reduction mammoplasty 10/30/2013   Seizure (HCC) 10/06/2013   Status post bilateral breast implants 08/31/2013   S/P breast reconstruction, right 08/23/2013   AC joint arthropathy 07/02/2013   Routine adult health maintenance 05/29/2013   Hypokalemia 05/21/2013   Chronic pain 04/19/2013   Acquired absence of breast and absent nipple 01/30/2013   RHINOSINUSITIS, CHRONIC 04/08/2010   Morbid obesity (HCC) 08/04/2006   DEPRESSIVE DISORDER, NOS 08/04/2006    PCP: Karen Legions, MD  REFERRING PROVIDER: Legions Karen PARAS, MD   REFERRING DIAG: I89.0 (ICD-10-CM) - Lymphedema of right arm   THERAPY DIAG:  Postmastectomy lymphedema  Stiffness of right shoulder, not elsewhere classified  History of right breast cancer  ONSET DATE: 2011  Rationale for Evaluation and Treatment Rehabilitation  SUBJECTIVE                                                                                                                                                                                           SUBJECTIVE STATEMENT: [name] called and they said my garment will ship in 7-10 days.   PERTINENT HISTORY:  Right breast cancer with mastectomy 2011 and ALND 22 nodes; reconstruction; left breast reduction; h/o right UE lymphedema and right upper quadrant pain, treated in this clinic several times before.  PAIN:  Are you having pain?No not right now  PRECAUTIONS: Other: R UE lymphedema, has metal in ankle  RED FLAGS: none  WEIGHT BEARING RESTRICTIONS No  FALLS:  Has patient fallen in last 6 months? No  LIVING ENVIRONMENT: Lives with: lives with 3 sons Lives in: House/apartment Stairs: No;  Has following equipment at home: None  OCCUPATION: on disability  LEISURE: trying  to exercise by walking but has been bothered by her L ankle pain  HAND DOMINANCE : right   PRIOR LEVEL OF FUNCTION: Independent  PATIENT GOALS to get my arm down and try  to maintain it   OBJECTIVE  COGNITION:  Overall cognitive status: Within functional limits for tasks assessed    OBSERVATIONS / OTHER ASSESSMENTS: R upper arm considerable larger than left, area of swelling at posterior trunk just above band on bra  POSTURE: forward head, rounded shoulders  UPPER EXTREMITY AROM/PROM:  A/PROM RIGHT   eval  Right 11/18/23 Right  11/23/23 12/02/23  Shoulder extension 55     Shoulder flexion 133 142 145 145  Shoulder abduction 85 102 106 162  Shoulder internal rotation 40     Shoulder external rotation 60       (Blank rows = not tested)  A/PROM LEFT   eval  Shoulder extension 63  Shoulder flexion 175  Shoulder abduction 171  Shoulder internal rotation 64  Shoulder external rotation 95    (Blank rows = not tested)   LYMPHEDEMA ASSESSMENTS:   SURGERY TYPE/DATE: R mastectomy and ALND in 2011  NUMBER OF LYMPH NODES REMOVED: 1/20  CHEMOTHERAPY: completed  RADIATION:completed Jan 2012  HORMONE TREATMENT: anastrozole  from Nov 2016 to Dec 2019  INFECTIONS: none  LYMPHEDEMA ASSESSMENTS:   LANDMARK RIGHT  Eval on 03/16/22 10/25/23 11/11/23 11/16/23 11/23/23 12/16/23  10 cm proximal to olecranon process 47.1 50.7 47.2 47.4 47.1 46.4  Olecranon process 32.6 34.5 34 34.5 34.1 32.7  10 cm proximal to ulnar styloid process 23.6 26 26.4 26.5 25.4 25.3  Just proximal to ulnar styloid process 17.7 18 17.7 17.3 17.4 16.9  Across hand at thumb web space 21.4 22 20.2 20.2 20.3 19.2  At base of 2nd digit 6.1 6.2 5.8 6.1 5.8 5.9  (Blank rows = not tested)  LANDMARK LEFT  Eval on 03/16/22 10/25/23  10 cm proximal to olecranon process 43 46  Olecranon process 30 32.4  10 cm proximal to ulnar styloid process 24.4 26.1  Just proximal to ulnar styloid process 17.3 18.6  Across hand  at thumb web space 20.6 22.1  At base of 2nd digit 6.4 6.4  (Blank rows = not tested)   Flowsheet Row Outpatient Rehab from 10/25/2023 in [facility]  Lymphedema Life Impact Scale Total Score 72.06 %      TODAY'S TREATMENT  12/16/23: Therapeutic Exercises  Pulleys into flex and abd x 2 mins each with VC's to decrease Rt scapular compensation Ball roll up wall into flex abd then Rt UE abd x 15 each with slow, prolonged holds Manual Therapy In supine: Short neck, 5 diaphragmatic breaths, L axillary and pectoral nodes, intact anterior thorax sequence, and establishment of anterior interaxillary pathway, R inguinal nodes and establishment of Rt axilloinguinal pathway, then R UE working proximal to distal, moving inner upper arm outwards and upwards, and doing both sides of forearms, spending extra time in any areas of fibrosis then retracing all steps Compression Bandaging to Rt UE as follows: Cocoa butter, TG soft, artiflex to antecubital fossa, fixated 1/4 foam to forearm and then upper arm with Idealbinde, Molelast to fingers 1-4, artiflex to dorsal hand/wrist, then short stretch compression bandages as follows: 1 - 6 cm to hand/wrist with 1/2 gray foam to dorsal hand, then 1- 8 cm herring bone fashion from wrist to above elbow, then 1 - 10 cm spiral with X at elbow, and then 1 - 12 cm spiral from wrist to axilla. Folded TG soft over top of bandage.  12/14/23: Therapeutic Exercises  Pulleys into flex and abd x 2 mins each with VC's to decrease Rt scapular compensation Manual Therapy  In supine: Short neck, 5 diaphragmatic breaths, L axillary nodes and establishment of interaxillary pathway, R inguinal nodes and establishment of axilloinguinal pathway, then R UE working proximal to distal, moving inner upper arm outwards and upwards, and doing both sides of forearms, spending extra time in any areas of fibrosis then retracing all steps Compression Bandaging to Rt UE  as follows: Cocoa butter, TG soft, artiflex to antecubital fossa, fixated 1/4 foam to forearm and then upper arm with Idealbinde, Molelast to fingers 1-4, artiflex to dorsal hand/wrist, then short stretch compression bandages as follows: 1 - 6 cm to hand/wrist with 1/2 gray foam to dorsal hand, then 1- 8 cm herring bone fashion from wrist to above elbow, then 1 - 10 cm spiral with X at elbow, and then 1 - 12 cm spiral from wrist to axilla. Folded TG soft over top of bandage.  12/12/23: Self Care Spent beginning of session calling [name] to see if they got signed script back from Dr. Beverley and they have. So they are finalizing placing of order today.  Therapeutic Exercises  Pulleys into flex and abd x 2 mins each with VC's to decrease Rt scapular compensation Free Motion Machine for Rt UE ext starting at [facility] position: started with 10# but pt unable to control compensations so decreased weight to 7# and no problems here, x 20 reps Manual Therapy Did not have time for MLD today as pt arrived late and she wanted to review her HEP shoulder ext P/ROM to R shoulder with scapular depression by therapist during STM trigger points in Rt UT and Rt lats Compression Bandaging to Rt UE as follows: Cocoa butter, TG soft, artiflex to antecubital fossa, fixated 1/4 foam to forearm and then upper arm with Idealbinde, Molelast to fingers 1-4, artiflex to dorsal hand/wrist, then short stretch compression bandages as follows: 1 - 6 cm to hand/wrist with 1/2 gray foam to dorsal hand, then 1- 8 cm herring bone fashion from wrist to above elbow, then 1 - 10 cm spiral with X at elbow, and then 1 - 12 cm spiral from wrist to axilla. Folded TG soft over top of bandage.  12/08/23 Therapeutic Exercises  Pulleys into flex and abd x 2 mins each with VC's to decrease Rt scapular compensation Roll yellow ball up wall into flexion x 10 and 10 reps in to abduction on R Manual Therapy In supine: Short neck, 5  diaphragmatic breaths , Lt axillary and pectoral nodes, establishment of anterior inter-axillary pathway, Rt inguinal nodes and establishment of Rt axillo-inguinal pathway, then R UE working proximal to distal, moving inner upper arm outwards and upwards, and doing both sides of forearms, then retracing all steps Compression Bandaging to Rt UE: Cocoa butter, TG soft, artiflex to antecubital fossa, fixated 1/4 foam to forearm and then upper arm with Idealbinde, Molelast to fingers 1-4, artiflex to dorsal hand/wrist, then short stretch compression bandages as follows: 1 - 6 cm to hand/wrist with 1/2 gray foam to dorsal hand, then 1- 8 cm herring bone fashion from wrist to elbow, then 1 - 10 cm spiral with X at elbow, and then 1 - 12 cm spiral from wrist to axilla. Folded TG soft over top of bandage.   12/05/23 Rechecked goals and extended POC Therapeutic Exercises  Pulleys into flex and abd x 3 mins each with VC's to decrease Rt scapular compensation Roll yellow ball up wall into flexion x 10  Manual Therapy In supine: Short neck, 5 diaphragmatic breaths ,  Lt axillary and pectoral nodes, establishment of anterior inter-axillary pathway, Rt inguinal nodes and establishment of Rt axillo-inguinal pathway, then R UE working proximal to distal, moving inner upper arm outwards and upwards, and doing both sides of forearms, then retracing all steps Compression Bandaging to Rt UE: Cocoa butter, TG soft, artiflex to antecubital fossa, fixated 1/4 foam to forearm and then upper arm with Idealbinde, Molelast to fingers 1-4, artiflex to dorsal hand/wrist, then short stretch compression bandages as follows: 1 - 6 cm to hand/wrist with 1/2 gray foam to dorsal hand, then 1- 8 cm herring bone fashion from wrist to elbow, then 1 - 10 cm spiral with X at elbow, and then 1 - 12 cm spiral from wrist to axilla. Folded TG soft over top of bandage.   12/02/23: Therapeutic Exercises  Pulleys into flex and abd x 3 mins  each with VC's to decrease Rt scapular compensation Roll yellow ball up wall into flexion x 10  Manual Therapy In supine: Short neck, 5 diaphragmatic breaths , Lt axillary and pectoral nodes, establishment of anterior inter-axillary pathway, Rt inguinal nodes and establishment of Rt axillo-inguinal pathway, then R UE working proximal to distal, moving inner upper arm outwards and upwards, and doing both sides of forearms, then retracing all steps Compression Bandaging to Rt UE: Cocoa butter, TG soft, artiflex to antecubital fossa, fixated 1/4 foam to forearm and then upper arm with Idealbinde, Molelast to fingers 1-4, artiflex to dorsal hand/wrist, then short stretch compression bandages as follows: 1 - 6 cm to hand/wrist with 1/2 gray foam to dorsal hand, then 1- 8 cm herring bone fashion from wrist to elbow, then 1 - 10 cm spiral with X at elbow, and then 1 - 12 cm spiral from wrist to axilla. Folded TG soft over top of bandage.   11/30/23: Therapeutic Exercises  Pulleys into flex and abd x 3 mins each with VC's to decrease Rt scapular compensation Roll yellow ball up wall into flexion x 10  Therapeutic Activities FreeMotion Machine for Rt UE ext with 7# and arm of machine on #4 with focus on decreasing scap compensation and engaging lateral trunk as able due to lat flap Manual Therapy In supine: Short neck, 5 diaphragmatic breaths , Lt axillary and pectoral nodes, establishment of anterior inter-axillary pathway, Rt inguinal nodes and establishment of Rt axillo-inguinal pathway, then R UE working proximal to distal, moving inner upper arm outwards and upwards, and doing both sides of forearms, then retracing all steps Compression Bandaging to Rt UE: Cocoa butter, TG soft, artiflex to antecubital fossa, fixated 1/4 foam to forearm and then upper arm with Idealbinde, Molelast to fingers 1-4, artiflex to dorsal hand/wrist, then short stretch compression bandages as follows: 1 - 6 cm to hand/wrist  with 1/2 gray foam to dorsal hand, then 1- 8 cm herring bone fashion from wrist to elbow, then 1 - 10 cm spiral with X at elbow, and then 1 - 12 cm spiral from wrist to axilla. Folded TG soft over top of bandage.    PATIENT EDUCATION:  Education details: Rt lateral trunk/UE stretch and Rt postural strength Lacey educated: Patient Education method: Explanation Education comprehension: verbalized understanding   HOME EXERCISE PROGRAM: None yet  ASSESSMENT:  CLINICAL IMPRESSION: Pt reports feeling alittle tighter in axilla today but this felt better after stretching. Continued with CDT of Rt UE until her new garments, that have shipped, arrive.   OBJECTIVE IMPAIRMENTS decreased ROM and increased edema.   ACTIVITY LIMITATIONS carrying,  lifting, and reach over head  PARTICIPATION LIMITATIONS: none  PERSONAL FACTORS Time since onset of injury/illness/exacerbation are also affecting patient's functional outcome.   REHAB POTENTIAL: Good  CLINICAL DECISION MAKING: Stable/uncomplicated  EVALUATION COMPLEXITY: Low  GOALS: Goals reviewed with patient? Yes  SHORT TERM GOALS: Target date: 11/15/23   Pt will demonstrate a 2 cm reduction in edema at 10 cm proximal to olecranon process.  Baseline: 50.7 cm; 11/23/23 - 47.1 (3.6 cm reduction) Goal status: MET  2.  Pt will demonstrate 110 degrees of R shoulder abduction to allow pt to reach out to the side.  Baseline: 85; 11/23/23 - 106 Goal status: MET  3.  Pt will obtain a compression bra for long term management of truncal edema especially posterior..  Baseline:  Goal status: MET  4.  Pt will demonstrate 150 degrees of R shoulder flexion to allow her to reach overhead.  Baseline: 133; 11/23/23 - 145 degrees, 12/05/23 - 145 deg  Goal status: ONGOING   LONG TERM GOALS: Target date: 01/02/24    Pt will demonstrate a 3 cm reduction in edema at 10 cm proximal to olecranon process.  Baseline: 50.7 cm; 11/23/23 - 47.1 cm (3.6 cm  reduction) Goal status: MET  2.  Pt will demonstrate 165 degrees of R shoulder abduction to allow pt to reach out to the side.  Baseline: 85 degrees; 11/23/23 - 106 degrees, 12/05/23 - 165 Goal status: MET  3.  Pt will obtain appropriate day time and night time garments for long term management of R UE lymphedema.  Baseline: Needs new garments; 11/23/23 - pt was just measured today for new day and nighttime garments Goal status: PARTIALLY MET  4.  Pt will be able to independently manage her lymphedema through self MLD and compression garments.  Baseline: Pt is independent with self MLD and has been measured for compression garments. Goal status: PARTIALLY MET  5.  Pt will demonstrate 165 degrees of R shoulder flexion to allow her to reach overhead.  Baseline: 13 degrees; 11/23/23 - 145 degrees Goal status: ONGOING   PLAN: PT FREQUENCY: 3x/week  PT DURATION: 6 weeks  PLANNED INTERVENTIONS: Therapeutic exercises, Therapeutic activity, Patient/Family education, Self Care, Joint mobilization, Orthotic/Fit training, Manual lymph drainage, Compression bandaging, Vasopneumatic device, Manual therapy, and Re-evaluation  PLAN FOR NEXT SESSION:  Cont CDT for Rt UE until new garments arrive now that they've shipped; review new HEP and progress as able   Aden Berwyn Caldron, PTA 12/16/2023, 12:16 PM   (Picture did not copy but they did print for pt) Lateral Trunk    Stand in doorway reaching overhead to the left and grab doorframe with Right hand and then lean left shoulder in until stretch felt along right trunk. Hold _10-20__ seconds. Do __3-5___ repetitions.   Lat Pull Down    Face anchor with knees slightly flexed and tummy tight. Palm down, pull arm down to side. Focus on slow and controlled motion. Keep elbow straight and shoulder down throughout up and down motions. Limit motion to where you can control the shoulder.  Repeat _10-20_ times per set. Do _2-3_ sets per day.   Anchor Height: Over Head   Cancer Rehab [phone]

## 2023-12-19 ENCOUNTER — Encounter: Payer: Self-pay | Admitting: Rehabilitation

## 2023-12-19 ENCOUNTER — Ambulatory Visit: Admitting: Rehabilitation

## 2023-12-19 DIAGNOSIS — I972 Postmastectomy lymphedema syndrome: Secondary | ICD-10-CM

## 2023-12-19 DIAGNOSIS — Z853 Personal history of malignant neoplasm of breast: Secondary | ICD-10-CM | POA: Diagnosis not present

## 2023-12-19 DIAGNOSIS — M25611 Stiffness of right shoulder, not elsewhere classified: Secondary | ICD-10-CM | POA: Diagnosis not present

## 2023-12-19 NOTE — Therapy (Signed)
 OUTPATIENT PHYSICAL THERAPY ONCOLOGY TREATMENT     Patient Name: Karen Hopkins Sofia MRN: [id number] DOB:1976-01-13, 48 y.o., female Today's Date: 12/19/2023   PT End of Session - 12/19/23 1105     Visit Number 20    Number of Visits 27    Date for PT Re-Evaluation 01/02/24    PT Start Time 1105    PT Stop Time 1158    PT Time Calculation (min) 53 min    Activity Tolerance Patient tolerated treatment well    Behavior During Therapy [facility] for tasks assessed/performed              Past Medical History:  Diagnosis Date   Anxiety    Arthritis    Cancer (HCC) 2011   Rt. Br. Ca   Depression    Diabetes mellitus without complication (HCC)    has been borderline   Essential hypertension 11/05/2015   H. pylori infection    History of radiation therapy    Hypertension    under control with med., has been on med. x 2 yr.   Lymphedema of arm    right; no BP or puncture to right arm   Personal history of chemotherapy 09/16/2009   rt breast   Pneumonia    Seasonal allergies    Sinus headache    Past Surgical History:  Procedure Laterality Date   BREAST BIOPSY Right 08/26/2009   BREAST REDUCTION SURGERY Left 08/23/2013   Procedure: LEFT BREAST REDUCTION  ;  Surgeon: Estefana Reichert, DO;  Location: [facility];  Service: Plastics;  Laterality: Left;   BREAST REDUCTION WITH MASTOPEXY Left 08/22/2019   Procedure: Left breast mastopexy reduction with liposuction for breast symmetry;  Surgeon: Lowery Estefana RAMAN, DO;  Location: [facility];  Service: Plastics;  Laterality: Left;   CESAREAN SECTION  07/15/2001; 03/18/2004; 11/21/2008   lateral orbiotomy Left 11/2015   Dr. Melba at [facility]   LATISSIMUS FLAP TO BREAST Right 03/12/2013   Procedure: RIGHT BREAST LATISSIMUS FLAP WITH EXPANDER PLACEMENT;  Surgeon: Estefana Reichert, DO;  Location: [facility];  Service: Plastics;  Laterality: Right;   LIPOSUCTION Bilateral 08/23/2013   Procedure: LIPOSUCTION;  Surgeon:  Estefana Reichert, DO;  Location: [facility];  Service: Plastics;  Laterality: Bilateral;   LIPOSUCTION WITH LIPOFILLING Left 08/22/2019   Procedure: LIPOSUCTION WITH LIPOFILLING;  Surgeon: Lowery Estefana RAMAN, DO;  Location: [facility];  Service: Plastics;  Laterality: Left;   MASTECTOMY Right 2011   MODIFIED RADICAL MASTECTOMY Right 03/11/2010   NASAL SEPTUM SURGERY     ORIF ANKLE FRACTURE Left 07/18/2018   Procedure: OPEN REDUCTION INTERNAL FIXATION (ORIF) LEFT ANKLE FRACTURE WITH  SYNDESMOSIS AND ANKLE ARTHROTOMY;  Surgeon: Elsa Lonni SAUNDERS, MD;  Location: [facility];  Service: Orthopedics;  Laterality: Left;   PORT-A-CATH REMOVAL Left 12/01/2010   PORTACATH PLACEMENT Left 09/12/2009   REDUCTION MAMMAPLASTY Left 08/2013   REMOVAL OF TISSUE EXPANDER AND PLACEMENT OF IMPLANT Right 08/23/2013   Procedure: REMOVAL RIGHT TISSUE EXPANDER AND PLACEMENT OF IMPLANT TO RIGHT BREAST ;  Surgeon: Estefana Reichert, DO;  Location:  SURGERY CENTER;  Service: Plastics;  Laterality: Right;   SUPRA-UMBILICAL HERNIA  2006   TUBAL LIGATION  11/21/2008   UMBILICAL HERNIA REPAIR  04/12/2022   CCS DR   Patient Active Problem List   Diagnosis Date Noted   Contraceptive, surveillance, intrauterine device 04/13/2023   Nephrolithiasis 10/21/2022   H. pylori infection 09/25/2022   Abdominal hernia  09/25/2022   Seasonal allergies 09/25/2022   Carpal tunnel syndrome 10/07/2021   Genetic testing 08/14/2019   Breast asymmetry following reconstructive surgery 07/13/2019   Family history of breast cancer    Family history of prostate cancer    Unintentional weight loss 05/21/2019   Lymphedema of right arm 02/27/2018   Dyspnea 09/23/2016   Eczema 08/31/2016   Essential hypertension 11/05/2015   Cervical dystonia 08/25/2015   Headache 07/09/2015   Myofascial muscle pain 04/29/2015   Adhesive capsulitis of right shoulder 04/29/2015   Lower extremity edema  10/01/2014   Malignant neoplasm of overlapping sites of right breast in female, estrogen receptor positive (HCC) 05/24/2014   Myalgia and myositis 05/17/2014   Neoplasm related pain 03/12/2014   Constipation 11/26/2013   H/O reduction mammoplasty 10/30/2013   Seizure (HCC) 10/06/2013   Status post bilateral breast implants 08/31/2013   S/P breast reconstruction, right 08/23/2013   AC joint arthropathy 07/02/2013   Routine adult health maintenance 05/29/2013   Hypokalemia 05/21/2013   Chronic pain 04/19/2013   Acquired absence of breast and absent nipple 01/30/2013   RHINOSINUSITIS, CHRONIC 04/08/2010   Morbid obesity (HCC) 08/04/2006   DEPRESSIVE DISORDER, NOS 08/04/2006    PCP: Laymon Legions, MD  REFERRING PROVIDER: Legions Laymon PARAS, MD   REFERRING DIAG: I89.0 (ICD-10-CM) - Lymphedema of right arm   THERAPY DIAG:  Postmastectomy lymphedema  Stiffness of right shoulder, not elsewhere classified  History of right breast cancer  ONSET DATE: 2011  Rationale for Evaluation and Treatment Rehabilitation  SUBJECTIVE                                                                                                                                                                                           SUBJECTIVE STATEMENT: I am sore for some reason today.  I think I slept wrong  PERTINENT HISTORY:  Right breast cancer with mastectomy 2011 and ALND 22 nodes; reconstruction; left breast reduction; h/o right UE lymphedema and right upper quadrant pain, treated in this clinic several times before.  PAIN:  Are you having pain?No not right now  PRECAUTIONS: Other: R UE lymphedema, has metal in ankle  RED FLAGS: none  WEIGHT BEARING RESTRICTIONS No  FALLS:  Has patient fallen in last 6 months? No  LIVING ENVIRONMENT: Lives with: lives with 3 sons Lives in: House/apartment Stairs: No;  Has following equipment at home: None  OCCUPATION: on disability  LEISURE:  trying to exercise by walking but has been bothered by her L ankle pain  HAND DOMINANCE : right   PRIOR LEVEL OF FUNCTION: Independent  PATIENT GOALS to get my arm  down and try to maintain it   OBJECTIVE  COGNITION:  Overall cognitive status: Within functional limits for tasks assessed    OBSERVATIONS / OTHER ASSESSMENTS: R upper arm considerable larger than left, area of swelling at posterior trunk just above band on bra  POSTURE: forward head, rounded shoulders  UPPER EXTREMITY AROM/PROM:  A/PROM RIGHT   eval  Right 11/18/23 Right  11/23/23 12/02/23  Shoulder extension 55     Shoulder flexion 133 142 145 145  Shoulder abduction 85 102 106 162  Shoulder internal rotation 40     Shoulder external rotation 60       (Blank rows = not tested)  A/PROM LEFT   eval  Shoulder extension 63  Shoulder flexion 175  Shoulder abduction 171  Shoulder internal rotation 64  Shoulder external rotation 95    (Blank rows = not tested)  LYMPHEDEMA ASSESSMENTS:  SURGERY TYPE/DATE: R mastectomy and ALND in 2011 NUMBER OF LYMPH NODES REMOVED: 1/20 CHEMOTHERAPY: completed RADIATION:completed Jan 2012 HORMONE TREATMENT: anastrozole  from Nov 2016 to Dec 2019 INFECTIONS: none  LYMPHEDEMA ASSESSMENTS:   LANDMARK RIGHT  Eval on 03/16/22 10/25/23 11/11/23 11/16/23 11/23/23 12/16/23  10 cm proximal to olecranon process 47.1 50.7 47.2 47.4 47.1 46.4  Olecranon process 32.6 34.5 34 34.5 34.1 32.7  10 cm proximal to ulnar styloid process 23.6 26 26.4 26.5 25.4 25.3  Just proximal to ulnar styloid process 17.7 18 17.7 17.3 17.4 16.9  Across hand at thumb web space 21.4 22 20.2 20.2 20.3 19.2  At base of 2nd digit 6.1 6.2 5.8 6.1 5.8 5.9  (Blank rows = not tested)  LANDMARK LEFT  Eval on 03/16/22 10/25/23  10 cm proximal to olecranon process 43 46  Olecranon process 30 32.4  10 cm proximal to ulnar styloid process 24.4 26.1  Just proximal to ulnar styloid process 17.3 18.6  Across hand at thumb  web space 20.6 22.1  At base of 2nd digit 6.4 6.4  (Blank rows = not tested)   Flowsheet Row Outpatient Rehab from 10/25/2023 in [facility]  Lymphedema Life Impact Scale Total Score 72.06 %      TODAY'S TREATMENT  12/19/23: Therapeutic Exercises  Pulleys into flex and abd x 2 mins each with VC's to decrease Rt scapular compensation Ball roll up wall into flex abd then Rt UE abd x 15 each with slow, prolonged holds Manual Therapy In supine: Short neck, 5 diaphragmatic breaths, L axillary and pectoral nodes, establishment of anterior interaxillary pathway, R inguinal nodes and establishment of Rt axilloinguinal pathway, then R UE working proximal to distal, moving inner upper arm outwards and upwards, and doing both sides of forearms, spending extra time in any areas of fibrosis then retracing all steps Compression Bandaging to Rt UE as follows: Cocoa butter, TG soft, artiflex to antecubital fossa, fixated 1/4 foam to forearm and then upper arm with Idealbinde, Molelast to fingers 1-4, artiflex to dorsal hand/wrist, then short stretch compression bandages as follows: 1 - 6 cm to hand/wrist with 1/2 gray foam to dorsal hand, then 1- 8 cm herring bone fashion from wrist to above elbow, then 1 - 10 cm spiral with X at elbow, and then 1 - 12 cm spiral from wrist to axilla. Folded TG soft over top of bandage.  12/16/23: Therapeutic Exercises  Pulleys into flex and abd x 2 mins each with VC's to decrease Rt scapular compensation Ball roll up wall into flex abd then Rt UE abd  x 15 each with slow, prolonged holds Manual Therapy In supine: Short neck, 5 diaphragmatic breaths, L axillary and pectoral nodes, intact anterior thorax sequence, and establishment of anterior interaxillary pathway, R inguinal nodes and establishment of Rt axilloinguinal pathway, then R UE working proximal to distal, moving inner upper arm outwards and upwards, and doing both sides of forearms,  spending extra time in any areas of fibrosis then retracing all steps Compression Bandaging to Rt UE as follows: Cocoa butter, TG soft, artiflex to antecubital fossa, fixated 1/4 foam to forearm and then upper arm with Idealbinde, Molelast to fingers 1-4, artiflex to dorsal hand/wrist, then short stretch compression bandages as follows: 1 - 6 cm to hand/wrist with 1/2 gray foam to dorsal hand, then 1- 8 cm herring bone fashion from wrist to above elbow, then 1 - 10 cm spiral with X at elbow, and then 1 - 12 cm spiral from wrist to axilla. Folded TG soft over top of bandage.  12/14/23: Therapeutic Exercises  Pulleys into flex and abd x 2 mins each with VC's to decrease Rt scapular compensation Manual Therapy In supine: Short neck, 5 diaphragmatic breaths, L axillary nodes and establishment of interaxillary pathway, R inguinal nodes and establishment of axilloinguinal pathway, then R UE working proximal to distal, moving inner upper arm outwards and upwards, and doing both sides of forearms, spending extra time in any areas of fibrosis then retracing all steps Compression Bandaging to Rt UE as follows: Cocoa butter, TG soft, artiflex to antecubital fossa, fixated 1/4 foam to forearm and then upper arm with Idealbinde, Molelast to fingers 1-4, artiflex to dorsal hand/wrist, then short stretch compression bandages as follows: 1 - 6 cm to hand/wrist with 1/2 gray foam to dorsal hand, then 1- 8 cm herring bone fashion from wrist to above elbow, then 1 - 10 cm spiral with X at elbow, and then 1 - 12 cm spiral from wrist to axilla. Folded TG soft over top of bandage.  PATIENT EDUCATION:  Education details: Rt lateral trunk/UE stretch and Rt postural strength [place] educated: Patient Education method: Explanation Education comprehension: verbalized understanding  HOME EXERCISE PROGRAM: None yet  ASSESSMENT:  CLINICAL IMPRESSION: Continued with CDT of Rt UE until her new garments, that have  shipped, arrive.   OBJECTIVE IMPAIRMENTS decreased ROM and increased edema.   ACTIVITY LIMITATIONS carrying, lifting, and reach over head  PARTICIPATION LIMITATIONS: none  PERSONAL FACTORS Time since onset of injury/illness/exacerbation are also affecting patient's functional outcome.   REHAB POTENTIAL: Good  CLINICAL DECISION MAKING: Stable/uncomplicated  EVALUATION COMPLEXITY: Low  GOALS: Goals reviewed with patient? Yes  SHORT TERM GOALS: Target date: 11/15/23   Pt will demonstrate a 2 cm reduction in edema at 10 cm proximal to olecranon process.  Baseline: 50.7 cm; 11/23/23 - 47.1 (3.6 cm reduction) Goal status: MET  2.  Pt will demonstrate 110 degrees of R shoulder abduction to allow pt to reach out to the side.  Baseline: 85; 11/23/23 - 106 Goal status: MET  3.  Pt will obtain a compression bra for long term management of truncal edema especially posterior..  Baseline:  Goal status: MET  4.  Pt will demonstrate 150 degrees of R shoulder flexion to allow her to reach overhead.  Baseline: 133; 11/23/23 - 145 degrees, 12/05/23 - 145 deg  Goal status: ONGOING   LONG TERM GOALS: Target date: 01/02/24    Pt will demonstrate a 3 cm reduction in edema at 10 cm proximal to olecranon process.  Baseline: 50.7 cm; 11/23/23 - 47.1 cm (3.6 cm reduction) Goal status: MET  2.  Pt will demonstrate 165 degrees of R shoulder abduction to allow pt to reach out to the side.  Baseline: 85 degrees; 11/23/23 - 106 degrees, 12/05/23 - 165 Goal status: MET  3.  Pt will obtain appropriate day time and night time garments for long term management of R UE lymphedema.  Baseline: Needs new garments; 11/23/23 - pt was just measured today for new day and nighttime garments Goal status: PARTIALLY MET  4.  Pt will be able to independently manage her lymphedema through self MLD and compression garments.  Baseline: Pt is independent with self MLD and has been measured for compression garments. Goal  status: PARTIALLY MET  5.  Pt will demonstrate 165 degrees of R shoulder flexion to allow her to reach overhead.  Baseline: 13 degrees; 11/23/23 - 145 degrees Goal status: ONGOING   PLAN: PT FREQUENCY: 3x/week  PT DURATION: 6 weeks  PLANNED INTERVENTIONS: Therapeutic exercises, Therapeutic activity, Patient/Family education, Self Care, Joint mobilization, Orthotic/Fit training, Manual lymph drainage, Compression bandaging, Vasopneumatic device, Manual therapy, and Re-evaluation  PLAN FOR NEXT SESSION:  Cont CDT for Rt UE until new garments arrive now that they've shipped; review new HEP and progress as able   Larue Saddie SAUNDERS, PT 12/19/2023, 11:59 AM   (Picture did not copy but they did print for pt) Lateral Trunk    Stand in doorway reaching overhead to the left and grab doorframe with Right hand and then lean left shoulder in until stretch felt along right trunk. Hold _10-20__ seconds. Do __3-5___ repetitions.   Lat Pull Down    Face anchor with knees slightly flexed and tummy tight. Palm down, pull arm down to side. Focus on slow and controlled motion. Keep elbow straight and shoulder down throughout up and down motions. Limit motion to where you can control the shoulder.  Repeat _10-20_ times per set. Do _2-3_ sets per day.  Anchor Height: Over Head   Cancer Rehab [phone]

## 2023-12-21 ENCOUNTER — Ambulatory Visit

## 2023-12-21 DIAGNOSIS — I890 Lymphedema, not elsewhere classified: Secondary | ICD-10-CM | POA: Diagnosis not present

## 2023-12-22 DIAGNOSIS — G8929 Other chronic pain: Secondary | ICD-10-CM | POA: Diagnosis not present

## 2023-12-22 DIAGNOSIS — M542 Cervicalgia: Secondary | ICD-10-CM | POA: Diagnosis not present

## 2023-12-22 DIAGNOSIS — M2550 Pain in unspecified joint: Secondary | ICD-10-CM | POA: Diagnosis not present

## 2023-12-22 DIAGNOSIS — K5903 Drug induced constipation: Secondary | ICD-10-CM | POA: Diagnosis not present

## 2023-12-22 DIAGNOSIS — M25572 Pain in left ankle and joints of left foot: Secondary | ICD-10-CM | POA: Diagnosis not present

## 2023-12-22 DIAGNOSIS — I972 Postmastectomy lymphedema syndrome: Secondary | ICD-10-CM | POA: Diagnosis not present

## 2023-12-22 DIAGNOSIS — Z79891 Long term (current) use of opiate analgesic: Secondary | ICD-10-CM | POA: Diagnosis not present

## 2023-12-22 DIAGNOSIS — R0789 Other chest pain: Secondary | ICD-10-CM | POA: Diagnosis not present

## 2023-12-26 ENCOUNTER — Ambulatory Visit

## 2023-12-26 DIAGNOSIS — M25611 Stiffness of right shoulder, not elsewhere classified: Secondary | ICD-10-CM | POA: Diagnosis not present

## 2023-12-26 DIAGNOSIS — Z853 Personal history of malignant neoplasm of breast: Secondary | ICD-10-CM | POA: Diagnosis not present

## 2023-12-26 DIAGNOSIS — I972 Postmastectomy lymphedema syndrome: Secondary | ICD-10-CM

## 2023-12-26 DIAGNOSIS — C50911 Malignant neoplasm of unspecified site of right female breast: Secondary | ICD-10-CM | POA: Diagnosis not present

## 2023-12-26 NOTE — Therapy (Signed)
 OUTPATIENT PHYSICAL THERAPY ONCOLOGY TREATMENT     Patient Name: Karen Hopkins MRN: [id number] DOB:[birth year], 48 y.o., female Today's Date: 12/26/2023   PT End of Session - 12/26/23 1544     Visit Number 21    Number of Visits 27    Date for PT Re-Evaluation 01/02/24    PT Start Time 1505    PT Stop Time 1559    PT Time Calculation (min) 54 min    Activity Tolerance Patient tolerated treatment well    Behavior During Therapy [facility] for tasks assessed/performed              Past Medical History:  Diagnosis Date   Anxiety    Arthritis    Cancer (HCC) 2011   Rt. Br. Ca   Depression    Diabetes mellitus without complication (HCC)    has been borderline   Essential hypertension 11/05/2015   H. pylori infection    History of radiation therapy    Hypertension    under control with med., has been on med. x 2 yr.   Lymphedema of arm    right; no BP or puncture to right arm   Personal history of chemotherapy 09/16/2009   rt breast   Pneumonia    Seasonal allergies    Sinus headache    Past Surgical History:  Procedure Laterality Date   BREAST BIOPSY Right 08/26/2009   BREAST REDUCTION SURGERY Left 08/23/2013   Procedure: LEFT BREAST REDUCTION  ;  Surgeon: Estefana Reichert, DO;  Location: [facility];  Service: Plastics;  Laterality: Left;   BREAST REDUCTION WITH MASTOPEXY Left 08/22/2019   Procedure: Left breast mastopexy reduction with liposuction for breast symmetry;  Surgeon: Lowery Estefana RAMAN, DO;  Location: [facility];  Service: Plastics;  Laterality: Left;   CESAREAN SECTION  07/15/2001; 03/18/2004; 11/21/2008   lateral orbiotomy Left 11/2015   Dr. Melba at [facility]   LATISSIMUS FLAP TO BREAST Right 03/12/2013   Procedure: RIGHT BREAST LATISSIMUS FLAP WITH EXPANDER PLACEMENT;  Surgeon: Estefana Reichert, DO;  Location: [facility];  Service: Plastics;  Laterality: Right;   LIPOSUCTION Bilateral 08/23/2013   Procedure: LIPOSUCTION;  Surgeon:  Estefana Reichert, DO;  Location: [facility];  Service: Plastics;  Laterality: Bilateral;   LIPOSUCTION WITH LIPOFILLING Left 08/22/2019   Procedure: LIPOSUCTION WITH LIPOFILLING;  Surgeon: Lowery Estefana RAMAN, DO;  Location: [facility];  Service: Plastics;  Laterality: Left;   MASTECTOMY Right 2011   MODIFIED RADICAL MASTECTOMY Right 03/11/2010   NASAL SEPTUM SURGERY     ORIF ANKLE FRACTURE Left 07/18/2018   Procedure: OPEN REDUCTION INTERNAL FIXATION (ORIF) LEFT ANKLE FRACTURE WITH  SYNDESMOSIS AND ANKLE ARTHROTOMY;  Surgeon: Elsa Lonni SAUNDERS, MD;  Location: [facility];  Service: Orthopedics;  Laterality: Left;   PORT-A-CATH REMOVAL Left 12/01/2010   PORTACATH PLACEMENT Left 09/12/2009   REDUCTION MAMMAPLASTY Left 08/2013   REMOVAL OF TISSUE EXPANDER AND PLACEMENT OF IMPLANT Right 08/23/2013   Procedure: REMOVAL RIGHT TISSUE EXPANDER AND PLACEMENT OF IMPLANT TO RIGHT BREAST ;  Surgeon: Estefana Reichert, DO;  Location: [facility];  Service: Plastics;  Laterality: Right;   SUPRA-UMBILICAL HERNIA  2006   TUBAL LIGATION  11/21/2008   UMBILICAL HERNIA REPAIR  04/12/2022   CCS DR   Patient Active Problem List   Diagnosis Date Noted   Contraceptive, surveillance, intrauterine device 04/13/2023   Nephrolithiasis 10/21/2022   H. pylori infection 09/25/2022   Abdominal hernia  09/25/2022   Seasonal allergies 09/25/2022   Carpal tunnel syndrome 10/07/2021   Genetic testing 08/14/2019   Breast asymmetry following reconstructive surgery 07/13/2019   Family history of breast cancer    Family history of prostate cancer    Unintentional weight loss 05/21/2019   Lymphedema of right arm 02/27/2018   Dyspnea 09/23/2016   Eczema 08/31/2016   Essential hypertension 11/05/2015   Cervical dystonia 08/25/2015   Headache 07/09/2015   Myofascial muscle pain 04/29/2015   Adhesive capsulitis of right shoulder 04/29/2015   Lower extremity edema  10/01/2014   Malignant neoplasm of overlapping sites of right breast in female, estrogen receptor positive (HCC) 05/24/2014   Myalgia and myositis 05/17/2014   Neoplasm related pain 03/12/2014   Constipation 11/26/2013   H/O reduction mammoplasty 10/30/2013   Seizure (HCC) 10/06/2013   Status post bilateral breast implants 08/31/2013   S/P breast reconstruction, right 08/23/2013   AC joint arthropathy 07/02/2013   Routine adult health maintenance 05/29/2013   Hypokalemia 05/21/2013   Chronic pain 04/19/2013   Acquired absence of breast and absent nipple 01/30/2013   RHINOSINUSITIS, CHRONIC 04/08/2010   Morbid obesity (HCC) 08/04/2006   DEPRESSIVE DISORDER, NOS 08/04/2006    PCP: Laymon Legions, MD  REFERRING PROVIDER: Legions Laymon PARAS, MD   REFERRING DIAG: I89.0 (ICD-10-CM) - Lymphedema of right arm   THERAPY DIAG:  Postmastectomy lymphedema  Stiffness of right shoulder, not elsewhere classified  History of right breast cancer  ONSET DATE: 2011  Rationale for Evaluation and Treatment Rehabilitation  SUBJECTIVE                                                                                                                                                                                           SUBJECTIVE STATEMENT: I got my new garments!!!  PERTINENT HISTORY:  Right breast cancer with mastectomy 2011 and ALND 22 nodes; reconstruction; left breast reduction; h/o right UE lymphedema and right upper quadrant pain, treated in this clinic several times before.  PAIN:  Are you having pain?No not right now  PRECAUTIONS: Other: R UE lymphedema, has metal in ankle  RED FLAGS: none  WEIGHT BEARING RESTRICTIONS No  FALLS:  Has patient fallen in last 6 months? No  LIVING ENVIRONMENT: Lives with: lives with 3 sons Lives in: House/apartment Stairs: No;  Has following equipment at home: None  OCCUPATION: on disability  LEISURE: trying to exercise by walking but  has been bothered by her L ankle pain  HAND DOMINANCE : right   PRIOR LEVEL OF FUNCTION: Independent  PATIENT GOALS to get my arm down and try to maintain it  OBJECTIVE  COGNITION:  Overall cognitive status: Within functional limits for tasks assessed    OBSERVATIONS / OTHER ASSESSMENTS: R upper arm considerable larger than left, area of swelling at posterior trunk just above band on bra  POSTURE: forward head, rounded shoulders  UPPER EXTREMITY AROM/PROM:  A/PROM RIGHT   eval  Right 11/18/23 Right  11/23/23 12/02/23  Shoulder extension 55     Shoulder flexion 133 142 145 145  Shoulder abduction 85 102 106 162  Shoulder internal rotation 40     Shoulder external rotation 60       (Blank rows = not tested)  A/PROM LEFT   eval  Shoulder extension 63  Shoulder flexion 175  Shoulder abduction 171  Shoulder internal rotation 64  Shoulder external rotation 95    (Blank rows = not tested)  LYMPHEDEMA ASSESSMENTS:  SURGERY TYPE/DATE: R mastectomy and ALND in 2011 NUMBER OF LYMPH NODES REMOVED: 1/20 CHEMOTHERAPY: completed RADIATION:completed Jan 2012 HORMONE TREATMENT: anastrozole  from Nov 2016 to Dec 2019 INFECTIONS: none  LYMPHEDEMA ASSESSMENTS:   LANDMARK RIGHT  Eval on 03/16/22 10/25/23 11/11/23 11/16/23 11/23/23 12/16/23  10 cm proximal to olecranon process 47.1 50.7 47.2 47.4 47.1 46.4  Olecranon process 32.6 34.5 34 34.5 34.1 32.7  10 cm proximal to ulnar styloid process 23.6 26 26.4 26.5 25.4 25.3  Just proximal to ulnar styloid process 17.7 18 17.7 17.3 17.4 16.9  Across hand at thumb web space 21.4 22 20.2 20.2 20.3 19.2  At base of 2nd digit 6.1 6.2 5.8 6.1 5.8 5.9  (Blank rows = not tested)  LANDMARK LEFT  Eval on 03/16/22 10/25/23  10 cm proximal to olecranon process 43 46  Olecranon process 30 32.4  10 cm proximal to ulnar styloid process 24.4 26.1  Just proximal to ulnar styloid process 17.3 18.6  Across hand at thumb web space 20.6 22.1  At base of  2nd digit 6.4 6.4  (Blank rows = not tested)   Flowsheet Row Outpatient Rehab from 10/25/2023 in [facility]  Lymphedema Life Impact Scale Total Score 72.06 %      TODAY'S TREATMENT  12/26/23: Orthotics fit Pt brought her new compression day and night garments. Assessed fit of both and educated her in proper donning techniques without overstretching the garment. Had pt return demo a few times to assess technique and instructed her to get a glove with rubber on grip, like a gardening glove, to assist with scooting garment up arm during day when it slides down. Pt verbalized understanding.  Therapeutic Exercises Pulleys into flex and abd x 2 mins each focusing on Rt scapular depression throughout Therapeutic Activities Bil UE 3 way raises with back against wall/core engaged, and scapula and head against wall,  2# x 12 each returning therapist demo  Wall Push Ups x 10 Free Motion Machine 7# with arms on setting 4 for bil UE ext x 12 focusing on controlling scapular rhythm   12/19/23: Therapeutic Exercises  Pulleys into flex and abd x 2 mins each with VC's to decrease Rt scapular compensation Ball roll up wall into flex abd then Rt UE abd x 15 each with slow, prolonged holds Manual Therapy In supine: Short neck, 5 diaphragmatic breaths, L axillary and pectoral nodes, establishment of anterior interaxillary pathway, R inguinal nodes and establishment of Rt axilloinguinal pathway, then R UE working proximal to distal, moving inner upper arm outwards and upwards, and doing both sides of forearms, spending extra time in any areas  of fibrosis then retracing all steps Compression Bandaging to Rt UE as follows: Cocoa butter, TG soft, artiflex to antecubital fossa, fixated 1/4 foam to forearm and then upper arm with Idealbinde, Molelast to fingers 1-4, artiflex to dorsal hand/wrist, then short stretch compression bandages as follows: 1 - 6 cm to hand/wrist with 1/2 gray foam  to dorsal hand, then 1- 8 cm herring bone fashion from wrist to above elbow, then 1 - 10 cm spiral with X at elbow, and then 1 - 12 cm spiral from wrist to axilla. Folded TG soft over top of bandage.  12/16/23: Therapeutic Exercises  Pulleys into flex and abd x 2 mins each with VC's to decrease Rt scapular compensation Ball roll up wall into flex abd then Rt UE abd x 15 each with slow, prolonged holds Manual Therapy In supine: Short neck, 5 diaphragmatic breaths, L axillary and pectoral nodes, intact anterior thorax sequence, and establishment of anterior interaxillary pathway, R inguinal nodes and establishment of Rt axilloinguinal pathway, then R UE working proximal to distal, moving inner upper arm outwards and upwards, and doing both sides of forearms, spending extra time in any areas of fibrosis then retracing all steps Compression Bandaging to Rt UE as follows: Cocoa butter, TG soft, artiflex to antecubital fossa, fixated 1/4 foam to forearm and then upper arm with Idealbinde, Molelast to fingers 1-4, artiflex to dorsal hand/wrist, then short stretch compression bandages as follows: 1 - 6 cm to hand/wrist with 1/2 gray foam to dorsal hand, then 1- 8 cm herring bone fashion from wrist to above elbow, then 1 - 10 cm spiral with X at elbow, and then 1 - 12 cm spiral from wrist to axilla. Folded TG soft over top of bandage.  12/14/23: Therapeutic Exercises  Pulleys into flex and abd x 2 mins each with VC's to decrease Rt scapular compensation Manual Therapy In supine: Short neck, 5 diaphragmatic breaths, L axillary nodes and establishment of interaxillary pathway, R inguinal nodes and establishment of axilloinguinal pathway, then R UE working proximal to distal, moving inner upper arm outwards and upwards, and doing both sides of forearms, spending extra time in any areas of fibrosis then retracing all steps Compression Bandaging to Rt UE as follows: Cocoa butter, TG soft, artiflex to antecubital  fossa, fixated 1/4 foam to forearm and then upper arm with Idealbinde, Molelast to fingers 1-4, artiflex to dorsal hand/wrist, then short stretch compression bandages as follows: 1 - 6 cm to hand/wrist with 1/2 gray foam to dorsal hand, then 1- 8 cm herring bone fashion from wrist to above elbow, then 1 - 10 cm spiral with X at elbow, and then 1 - 12 cm spiral from wrist to axilla. Folded TG soft over top of bandage.  PATIENT EDUCATION:  Education details: Bil UE 3 way raises Riffel educated: Patient Education method: Explanation, demonstration, VC's and handout issued Education comprehension: verbalized understanding, returned demonstration, will benefit from further review  HOME EXERCISE PROGRAM: 12/26/23 - Bil UE 3 way raises  ASSESSMENT:  CLINICAL IMPRESSION: Pt brought her new compression garments. These are a great fit and she reports comfortable. Will finish last few session to focus on finalizing HEP for Rt scap and shoulder strength.   OBJECTIVE IMPAIRMENTS decreased ROM and increased edema.   ACTIVITY LIMITATIONS carrying, lifting, and reach over head  PARTICIPATION LIMITATIONS: none  PERSONAL FACTORS Time since onset of injury/illness/exacerbation are also affecting patient's functional outcome.   REHAB POTENTIAL: Good  CLINICAL DECISION MAKING: Stable/uncomplicated  EVALUATION COMPLEXITY: Low  GOALS: Goals reviewed with patient? Yes  SHORT TERM GOALS: Target date: 11/15/23   Pt will demonstrate a 2 cm reduction in edema at 10 cm proximal to olecranon process.  Baseline: 50.7 cm; 11/23/23 - 47.1 (3.6 cm reduction) Goal status: MET  2.  Pt will demonstrate 110 degrees of R shoulder abduction to allow pt to reach out to the side.  Baseline: 85; 11/23/23 - 106 Goal status: MET  3.  Pt will obtain a compression bra for long term management of truncal edema especially posterior..  Baseline:  Goal status: MET  4.  Pt will demonstrate 150 degrees of R shoulder  flexion to allow her to reach overhead.  Baseline: 133; 11/23/23 - 145 degrees, 12/05/23 - 145 deg  Goal status: ONGOING   LONG TERM GOALS: Target date: 01/02/24    Pt will demonstrate a 3 cm reduction in edema at 10 cm proximal to olecranon process.  Baseline: 50.7 cm; 11/23/23 - 47.1 cm (3.6 cm reduction) Goal status: MET  2.  Pt will demonstrate 165 degrees of R shoulder abduction to allow pt to reach out to the side.  Baseline: 85 degrees; 11/23/23 - 106 degrees, 12/05/23 - 165 Goal status: MET  3.  Pt will obtain appropriate day time and night time garments for long term management of R UE lymphedema.  Baseline: Needs new garments; 11/23/23 - pt was just measured today for new day and nighttime garments; 12/26/23 - pt now has new garments Goal status: MET  4.  Pt will be able to independently manage her lymphedema through self MLD and compression garments.  Baseline: Pt is independent with self MLD and has been measured for compression garments. Goal status: PARTIALLY MET  5.  Pt will demonstrate 165 degrees of R shoulder flexion to allow her to reach overhead.  Baseline: 13 degrees; 11/23/23 - 145 degrees Goal status: ONGOING   PLAN: PT FREQUENCY: 3x/week  PT DURATION: 6 weeks  PLANNED INTERVENTIONS: Therapeutic exercises, Therapeutic activity, Patient/Family education, Self Care, Joint mobilization, Orthotic/Fit training, Manual lymph drainage, Compression bandaging, Vasopneumatic device, Manual therapy, and Re-evaluation  PLAN FOR NEXT SESSION:  Re measure A/ROM of Rt shoulder; how are garments? Cont with focus on Rt shoulder strength and finalizing HEP working towards D/c in next few session per POC;  review new HEP and progress as able   Aden Berwyn Caldron, PTA 12/26/2023, 4:07 PM    3 Way Raises:      Starting Position:  Leaning against wall, walk feet a few inches away from the wall and make tummy tight (tuck hips underneath you) Press back/shoulders/head  against wall as much as possible. Keep thumbs up to ceiling, elbows straight and shoulders relaxed/down throughout.  1. Lift arms in front to shoulder height 2. Lift arms a little wider into a V to shoulder height 3. Lift arms out to sides in a T to shoulder height  Perform 10 times in each direction. Hold 1-2 lbs to start with and work up to 2-3 sets of 10/day. Perform 3-4 times/week. Increase weight as able, decreasing sets of 10 each time you increase weights, then slowly working your way back up to 2-3 sets each time.    Cancer Rehab [phone]

## 2023-12-26 NOTE — Patient Instructions (Addendum)
 SABRA

## 2023-12-28 ENCOUNTER — Ambulatory Visit

## 2023-12-28 DIAGNOSIS — Z853 Personal history of malignant neoplasm of breast: Secondary | ICD-10-CM

## 2023-12-28 DIAGNOSIS — M25611 Stiffness of right shoulder, not elsewhere classified: Secondary | ICD-10-CM

## 2023-12-28 DIAGNOSIS — I972 Postmastectomy lymphedema syndrome: Secondary | ICD-10-CM

## 2023-12-28 NOTE — Therapy (Signed)
 OUTPATIENT PHYSICAL THERAPY ONCOLOGY TREATMENT     Patient Name: Karen Hopkins MRN: [id number] DOB:1975-09-07, 48 y.o., female Today's Date: 12/28/2023   PT End of Session - 12/28/23 1510     Visit Number 22    Number of Visits 27    Date for PT Re-Evaluation 01/02/24    PT Start Time 1502    PT Stop Time 1602    PT Time Calculation (min) 60 min    Activity Tolerance Patient tolerated treatment well    Behavior During Therapy [facility] for tasks assessed/performed              Past Medical History:  Diagnosis Date   Anxiety    Arthritis    Cancer (HCC) 2011   Rt. Br. Ca   Depression    Diabetes mellitus without complication (HCC)    has been borderline   Essential hypertension 11/05/2015   H. pylori infection    History of radiation therapy    Hypertension    under control with med., has been on med. x 2 yr.   Lymphedema of arm    right; no BP or puncture to right arm   Personal history of chemotherapy 09/16/2009   rt breast   Pneumonia    Seasonal allergies    Sinus headache    Past Surgical History:  Procedure Laterality Date   BREAST BIOPSY Right 08/26/2009   BREAST REDUCTION SURGERY Left 08/23/2013   Procedure: LEFT BREAST REDUCTION  ;  Surgeon: Estefana Reichert, DO;  Location: [facility];  Service: Plastics;  Laterality: Left;   BREAST REDUCTION WITH MASTOPEXY Left 08/22/2019   Procedure: Left breast mastopexy reduction with liposuction for breast symmetry;  Surgeon: Lowery Estefana RAMAN, DO;  Location: [facility];  Service: Plastics;  Laterality: Left;   CESAREAN SECTION  07/15/2001; 03/18/2004; 11/21/2008   lateral orbiotomy Left 11/2015   Dr. Melba at [facility]   LATISSIMUS FLAP TO BREAST Right 03/12/2013   Procedure: RIGHT BREAST LATISSIMUS FLAP WITH EXPANDER PLACEMENT;  Surgeon: Estefana Reichert, DO;  Location: [facility];  Service: Plastics;  Laterality: Right;   LIPOSUCTION Bilateral 08/23/2013   Procedure: LIPOSUCTION;  Surgeon:  Estefana Reichert, DO;  Location: [facility];  Service: Plastics;  Laterality: Bilateral;   LIPOSUCTION WITH LIPOFILLING Left 08/22/2019   Procedure: LIPOSUCTION WITH LIPOFILLING;  Surgeon: Lowery Estefana RAMAN, DO;  Location: [facility];  Service: Plastics;  Laterality: Left;   MASTECTOMY Right 2011   MODIFIED RADICAL MASTECTOMY Right 03/11/2010   NASAL SEPTUM SURGERY     ORIF ANKLE FRACTURE Left 07/18/2018   Procedure: OPEN REDUCTION INTERNAL FIXATION (ORIF) LEFT ANKLE FRACTURE WITH  SYNDESMOSIS AND ANKLE ARTHROTOMY;  Surgeon: Elsa Lonni SAUNDERS, MD;  Location: [facility];  Service: Orthopedics;  Laterality: Left;   PORT-A-CATH REMOVAL Left 12/01/2010   PORTACATH PLACEMENT Left 09/12/2009   REDUCTION MAMMAPLASTY Left 08/2013   REMOVAL OF TISSUE EXPANDER AND PLACEMENT OF IMPLANT Right 08/23/2013   Procedure: REMOVAL RIGHT TISSUE EXPANDER AND PLACEMENT OF IMPLANT TO RIGHT BREAST ;  Surgeon: Estefana Reichert, DO;  Location: [facility];  Service: Plastics;  Laterality: Right;   SUPRA-UMBILICAL HERNIA  2006   TUBAL LIGATION  11/21/2008   UMBILICAL HERNIA REPAIR  04/12/2022   CCS DR   Patient Active Problem List   Diagnosis Date Noted   Contraceptive, surveillance, intrauterine device 04/13/2023   Nephrolithiasis 10/21/2022   H. pylori infection 09/25/2022   Abdominal hernia  09/25/2022   Seasonal allergies 09/25/2022   Carpal tunnel syndrome 10/07/2021   Genetic testing 08/14/2019   Breast asymmetry following reconstructive surgery 07/13/2019   Family history of breast cancer    Family history of prostate cancer    Unintentional weight loss 05/21/2019   Lymphedema of right arm 02/27/2018   Dyspnea 09/23/2016   Eczema 08/31/2016   Essential hypertension 11/05/2015   Cervical dystonia 08/25/2015   Headache 07/09/2015   Myofascial muscle pain 04/29/2015   Adhesive capsulitis of right shoulder 04/29/2015   Lower extremity edema  10/01/2014   Malignant neoplasm of overlapping sites of right breast in female, estrogen receptor positive (HCC) 05/24/2014   Myalgia and myositis 05/17/2014   Neoplasm related pain 03/12/2014   Constipation 11/26/2013   H/O reduction mammoplasty 10/30/2013   Seizure (HCC) 10/06/2013   Status post bilateral breast implants 08/31/2013   S/P breast reconstruction, right 08/23/2013   AC joint arthropathy 07/02/2013   Routine adult health maintenance 05/29/2013   Hypokalemia 05/21/2013   Chronic pain 04/19/2013   Acquired absence of breast and absent nipple 01/30/2013   RHINOSINUSITIS, CHRONIC 04/08/2010   Morbid obesity (HCC) 08/04/2006   DEPRESSIVE DISORDER, NOS 08/04/2006    PCP: Laymon Legions, MD  REFERRING PROVIDER: Legions Laymon PARAS, MD   REFERRING DIAG: I89.0 (ICD-10-CM) - Lymphedema of right arm   THERAPY DIAG:  Postmastectomy lymphedema  Stiffness of right shoulder, not elsewhere classified  History of right breast cancer  ONSET DATE: 2011  Rationale for Evaluation and Treatment Rehabilitation  SUBJECTIVE                                                                                                                                                                                           SUBJECTIVE STATEMENT: My sleeve and glove are so comfortable. And my nighttime garment left the marks on my arm from the foam in the morning so I know it's tight enough.   PERTINENT HISTORY:  Right breast cancer with mastectomy 2011 and ALND 22 nodes; reconstruction; left breast reduction; h/o right UE lymphedema and right upper quadrant pain, treated in this clinic several times before.  PAIN:  Are you having pain?No not right now  PRECAUTIONS: Other: R UE lymphedema, has metal in ankle  RED FLAGS: none  WEIGHT BEARING RESTRICTIONS No  FALLS:  Has patient fallen in last 6 months? No  LIVING ENVIRONMENT: Lives with: lives with 3 sons Lives in:  House/apartment Stairs: No;  Has following equipment at home: None  OCCUPATION: on disability  LEISURE: trying to exercise by walking but has been bothered by her L ankle pain  HAND  DOMINANCE : right   PRIOR LEVEL OF FUNCTION: Independent  PATIENT GOALS to get my arm down and try to maintain it   OBJECTIVE  COGNITION:  Overall cognitive status: Within functional limits for tasks assessed    OBSERVATIONS / OTHER ASSESSMENTS: R upper arm considerable larger than left, area of swelling at posterior trunk just above band on bra  POSTURE: forward head, rounded shoulders  UPPER EXTREMITY AROM/PROM:  A/PROM RIGHT   eval  Right 11/18/23 Right  11/23/23 12/02/23  Shoulder extension 55     Shoulder flexion 133 142 145 145  Shoulder abduction 85 102 106 162  Shoulder internal rotation 40     Shoulder external rotation 60       (Blank rows = not tested)  A/PROM LEFT   eval  Shoulder extension 63  Shoulder flexion 175  Shoulder abduction 171  Shoulder internal rotation 64  Shoulder external rotation 95    (Blank rows = not tested)  LYMPHEDEMA ASSESSMENTS:  SURGERY TYPE/DATE: R mastectomy and ALND in 2011 NUMBER OF LYMPH NODES REMOVED: 1/20 CHEMOTHERAPY: completed RADIATION:completed Jan 2012 HORMONE TREATMENT: anastrozole  from Nov 2016 to Dec 2019 INFECTIONS: none  LYMPHEDEMA ASSESSMENTS:   LANDMARK RIGHT  Eval on 03/16/22 10/25/23 11/11/23 11/16/23 11/23/23 12/16/23  10 cm proximal to olecranon process 47.1 50.7 47.2 47.4 47.1 46.4  Olecranon process 32.6 34.5 34 34.5 34.1 32.7  10 cm proximal to ulnar styloid process 23.6 26 26.4 26.5 25.4 25.3  Just proximal to ulnar styloid process 17.7 18 17.7 17.3 17.4 16.9  Across hand at thumb web space 21.4 22 20.2 20.2 20.3 19.2  At base of 2nd digit 6.1 6.2 5.8 6.1 5.8 5.9  (Blank rows = not tested)  LANDMARK LEFT  Eval on 03/16/22 10/25/23  10 cm proximal to olecranon process 43 46  Olecranon process 30 32.4  10 cm  proximal to ulnar styloid process 24.4 26.1  Just proximal to ulnar styloid process 17.3 18.6  Across hand at thumb web space 20.6 22.1  At base of 2nd digit 6.4 6.4  (Blank rows = not tested)   Flowsheet Row Outpatient Rehab from 10/25/2023 in [facility]  Lymphedema Life Impact Scale Total Score 72.06 %      TODAY'S TREATMENT  12/28/23: Therapeutic Exercises Pulleys into flex and abd x 2 mins each focusing on Rt scapular depression throughout Roll yellow ball up wall into flex and abd x 15-20 each Therapeutic Activities Bil UE 3 way raises with back against wall/core engaged, and scapula and head against wall,  2# x 15 for flex and scaption, and then abd x 10 each returning therapist demo  Free Motion Machine 7# with arms on setting 4 for bil UE ext 2 x 15 focusing on controlling scapular rhythm and keeping core engaged Wall Push Ups x 10 Manual Therapy P/ROM to Rt shoulder into flex, abd and D2 with scapular depression by therapist throughout STM to scar tissue in Rt axilla, then to Rt UT and medial scap border in Lt S/L Scap Mobs in Lt S/L into protraction and retraction  12/26/23: Orthotics fit Pt brought her new compression day and night garments. Assessed fit of both and educated her in proper donning techniques without overstretching the garment. Had pt return demo a few times to assess technique and instructed her to get a glove with rubber on grip, like a gardening glove, to assist with scooting garment up arm during day when it slides down. Pt  verbalized understanding.  Therapeutic Exercises Pulleys into flex and abd x 2 mins each focusing on Rt scapular depression throughout Therapeutic Activities Bil UE 3 way raises with back against wall/core engaged, and scapula and head against wall,  2# x 12 each returning therapist demo  Wall Push Ups x 10 Free Motion Machine 7# with arms on setting 4 for bil UE ext x 12 focusing on controlling scapular  rhythm   12/19/23: Therapeutic Exercises  Pulleys into flex and abd x 2 mins each with VC's to decrease Rt scapular compensation Ball roll up wall into flex abd then Rt UE abd x 15 each with slow, prolonged holds Manual Therapy In supine: Short neck, 5 diaphragmatic breaths, L axillary and pectoral nodes, establishment of anterior interaxillary pathway, R inguinal nodes and establishment of Rt axilloinguinal pathway, then R UE working proximal to distal, moving inner upper arm outwards and upwards, and doing both sides of forearms, spending extra time in any areas of fibrosis then retracing all steps Compression Bandaging to Rt UE as follows: Cocoa butter, TG soft, artiflex to antecubital fossa, fixated 1/4 foam to forearm and then upper arm with Idealbinde, Molelast to fingers 1-4, artiflex to dorsal hand/wrist, then short stretch compression bandages as follows: 1 - 6 cm to hand/wrist with 1/2 gray foam to dorsal hand, then 1- 8 cm herring bone fashion from wrist to above elbow, then 1 - 10 cm spiral with X at elbow, and then 1 - 12 cm spiral from wrist to axilla. Folded TG soft over top of bandage.  12/16/23: Therapeutic Exercises  Pulleys into flex and abd x 2 mins each with VC's to decrease Rt scapular compensation Ball roll up wall into flex abd then Rt UE abd x 15 each with slow, prolonged holds Manual Therapy In supine: Short neck, 5 diaphragmatic breaths, L axillary and pectoral nodes, intact anterior thorax sequence, and establishment of anterior interaxillary pathway, R inguinal nodes and establishment of Rt axilloinguinal pathway, then R UE working proximal to distal, moving inner upper arm outwards and upwards, and doing both sides of forearms, spending extra time in any areas of fibrosis then retracing all steps Compression Bandaging to Rt UE as follows: Cocoa butter, TG soft, artiflex to antecubital fossa, fixated 1/4 foam to forearm and then upper arm with Idealbinde, Molelast to  fingers 1-4, artiflex to dorsal hand/wrist, then short stretch compression bandages as follows: 1 - 6 cm to hand/wrist with 1/2 gray foam to dorsal hand, then 1- 8 cm herring bone fashion from wrist to above elbow, then 1 - 10 cm spiral with X at elbow, and then 1 - 12 cm spiral from wrist to axilla. Folded TG soft over top of bandage.  12/14/23: Therapeutic Exercises  Pulleys into flex and abd x 2 mins each with VC's to decrease Rt scapular compensation Manual Therapy In supine: Short neck, 5 diaphragmatic breaths, L axillary nodes and establishment of interaxillary pathway, R inguinal nodes and establishment of axilloinguinal pathway, then R UE working proximal to distal, moving inner upper arm outwards and upwards, and doing both sides of forearms, spending extra time in any areas of fibrosis then retracing all steps Compression Bandaging to Rt UE as follows: Cocoa butter, TG soft, artiflex to antecubital fossa, fixated 1/4 foam to forearm and then upper arm with Idealbinde, Molelast to fingers 1-4, artiflex to dorsal hand/wrist, then short stretch compression bandages as follows: 1 - 6 cm to hand/wrist with 1/2 gray foam to dorsal hand, then 1-  8 cm herring bone fashion from wrist to above elbow, then 1 - 10 cm spiral with X at elbow, and then 1 - 12 cm spiral from wrist to axilla. Folded TG soft over top of bandage.  PATIENT EDUCATION:  Education details: Bil UE 3 way raises [place] educated: Patient Education method: Explanation, demonstration, VC's and handout issued Education comprehension: verbalized understanding, returned demonstration, will benefit from further review  HOME EXERCISE PROGRAM: 12/26/23 - Bil UE 3 way raises  ASSESSMENT:  CLINICAL IMPRESSION: Pt has her new day and nighttime compression garments and reports these are a great fit and no issues. Today continued with focus on AA/ROM working to decrease Rt scap compensation and then scap and shoulder strength.    OBJECTIVE IMPAIRMENTS decreased ROM and increased edema.   ACTIVITY LIMITATIONS carrying, lifting, and reach over head  PARTICIPATION LIMITATIONS: none  PERSONAL FACTORS Time since onset of injury/illness/exacerbation are also affecting patient's functional outcome.   REHAB POTENTIAL: Good  CLINICAL DECISION MAKING: Stable/uncomplicated  EVALUATION COMPLEXITY: Low  GOALS: Goals reviewed with patient? Yes  SHORT TERM GOALS: Target date: 11/15/23   Pt will demonstrate a 2 cm reduction in edema at 10 cm proximal to olecranon process.  Baseline: 50.7 cm; 11/23/23 - 47.1 (3.6 cm reduction) Goal status: MET  2.  Pt will demonstrate 110 degrees of R shoulder abduction to allow pt to reach out to the side.  Baseline: 85; 11/23/23 - 106 Goal status: MET  3.  Pt will obtain a compression bra for long term management of truncal edema especially posterior..  Baseline:  Goal status: MET  4.  Pt will demonstrate 150 degrees of R shoulder flexion to allow her to reach overhead.  Baseline: 133; 11/23/23 - 145 degrees, 12/05/23 - 145 deg  Goal status: ONGOING   LONG TERM GOALS: Target date: 01/02/24    Pt will demonstrate a 3 cm reduction in edema at 10 cm proximal to olecranon process.  Baseline: 50.7 cm; 11/23/23 - 47.1 cm (3.6 cm reduction) Goal status: MET  2.  Pt will demonstrate 165 degrees of R shoulder abduction to allow pt to reach out to the side.  Baseline: 85 degrees; 11/23/23 - 106 degrees, 12/05/23 - 165 Goal status: MET  3.  Pt will obtain appropriate day time and night time garments for long term management of R UE lymphedema.  Baseline: Needs new garments; 11/23/23 - pt was just measured today for new day and nighttime garments; 12/26/23 - pt now has new garments Goal status: MET  4.  Pt will be able to independently manage her lymphedema through self MLD and compression garments.  Baseline: Pt is independent with self MLD and has been measured for compression  garments. Goal status: PARTIALLY MET  5.  Pt will demonstrate 165 degrees of R shoulder flexion to allow her to reach overhead.  Baseline: 13 degrees; 11/23/23 - 145 degrees Goal status: ONGOING   PLAN: PT FREQUENCY: 3x/week  PT DURATION: 6 weeks  PLANNED INTERVENTIONS: Therapeutic exercises, Therapeutic activity, Patient/Family education, Self Care, Joint mobilization, Orthotic/Fit training, Manual lymph drainage, Compression bandaging, Vasopneumatic device, Manual therapy, and Re-evaluation  PLAN FOR NEXT SESSION: Cont with focus on Rt shoulder strength and finalizing HEP working towards D/c in next session per POC;  review new HEP and progress as able   Aden Berwyn Caldron, PTA 12/28/2023, 4:05 PM    3 Way Raises:      Starting Position:  Leaning against wall, walk feet a few inches away  from the wall and make tummy tight (tuck hips underneath you) Press back/shoulders/head against wall as much as possible. Keep thumbs up to ceiling, elbows straight and shoulders relaxed/down throughout.  1. Lift arms in front to shoulder height 2. Lift arms a little wider into a V to shoulder height 3. Lift arms out to sides in a T to shoulder height  Perform 10 times in each direction. Hold 1-2 lbs to start with and work up to 2-3 sets of 10/day. Perform 3-4 times/week. Increase weight as able, decreasing sets of 10 each time you increase weights, then slowly working your way back up to 2-3 sets each time.    Cancer Rehab [phone]

## 2023-12-29 ENCOUNTER — Ambulatory Visit

## 2024-01-04 ENCOUNTER — Ambulatory Visit

## 2024-01-04 DIAGNOSIS — Z853 Personal history of malignant neoplasm of breast: Secondary | ICD-10-CM

## 2024-01-04 DIAGNOSIS — I972 Postmastectomy lymphedema syndrome: Secondary | ICD-10-CM | POA: Diagnosis not present

## 2024-01-04 DIAGNOSIS — M25611 Stiffness of right shoulder, not elsewhere classified: Secondary | ICD-10-CM | POA: Diagnosis not present

## 2024-01-04 NOTE — Patient Instructions (Signed)
 SABRA

## 2024-01-04 NOTE — Therapy (Signed)
 OUTPATIENT PHYSICAL THERAPY ONCOLOGY TREATMENT     Patient Name: Karen Hopkins MRN: [id number] DOB:January 03, 1976, 48 y.o., female Today's Date: 01/04/2024   PT End of Session - 01/04/24 1405     Visit Number 23    Number of Visits 31    Date for PT Re-Evaluation 02/01/24    PT Start Time 1402    PT Stop Time 1456    PT Time Calculation (min) 54 min    Activity Tolerance Patient tolerated treatment well    Behavior During Therapy [facility] for tasks assessed/performed              Past Medical History:  Diagnosis Date   Anxiety    Arthritis    Cancer (HCC) 2011   Rt. Br. Ca   Depression    Diabetes mellitus without complication (HCC)    has been borderline   Essential hypertension 11/05/2015   H. pylori infection    History of radiation therapy    Hypertension    under control with med., has been on med. x 2 yr.   Lymphedema of arm    right; no BP or puncture to right arm   Personal history of chemotherapy 09/16/2009   rt breast   Pneumonia    Seasonal allergies    Sinus headache    Past Surgical History:  Procedure Laterality Date   BREAST BIOPSY Right 08/26/2009   BREAST REDUCTION SURGERY Left 08/23/2013   Procedure: LEFT BREAST REDUCTION  ;  Surgeon: Estefana Reichert, DO;  Location: [facility];  Service: Plastics;  Laterality: Left;   BREAST REDUCTION WITH MASTOPEXY Left 08/22/2019   Procedure: Left breast mastopexy reduction with liposuction for breast symmetry;  Surgeon: Lowery Estefana RAMAN, DO;  Location: [facility];  Service: Plastics;  Laterality: Left;   CESAREAN SECTION  07/15/2001; 03/18/2004; 11/21/2008   lateral orbiotomy Left 11/2015   Dr. Melba at [facility]   LATISSIMUS FLAP TO BREAST Right 03/12/2013   Procedure: RIGHT BREAST LATISSIMUS FLAP WITH EXPANDER PLACEMENT;  Surgeon: Estefana Reichert, DO;  Location: [facility];  Service: Plastics;  Laterality: Right;   LIPOSUCTION Bilateral 08/23/2013   Procedure: LIPOSUCTION;  Surgeon:  Estefana Reichert, DO;  Location: [facility];  Service: Plastics;  Laterality: Bilateral;   LIPOSUCTION WITH LIPOFILLING Left 08/22/2019   Procedure: LIPOSUCTION WITH LIPOFILLING;  Surgeon: Lowery Estefana RAMAN, DO;  Location: [facility];  Service: Plastics;  Laterality: Left;   MASTECTOMY Right 2011   MODIFIED RADICAL MASTECTOMY Right 03/11/2010   NASAL SEPTUM SURGERY     ORIF ANKLE FRACTURE Left 07/18/2018   Procedure: OPEN REDUCTION INTERNAL FIXATION (ORIF) LEFT ANKLE FRACTURE WITH  SYNDESMOSIS AND ANKLE ARTHROTOMY;  Surgeon: Elsa Lonni SAUNDERS, MD;  Location: [facility];  Service: Orthopedics;  Laterality: Left;   PORT-A-CATH REMOVAL Left 12/01/2010   PORTACATH PLACEMENT Left 09/12/2009   REDUCTION MAMMAPLASTY Left 08/2013   REMOVAL OF TISSUE EXPANDER AND PLACEMENT OF IMPLANT Right 08/23/2013   Procedure: REMOVAL RIGHT TISSUE EXPANDER AND PLACEMENT OF IMPLANT TO RIGHT BREAST ;  Surgeon: Estefana Reichert, DO;  Location: [facility];  Service: Plastics;  Laterality: Right;   SUPRA-UMBILICAL HERNIA  2006   TUBAL LIGATION  11/21/2008   UMBILICAL HERNIA REPAIR  04/12/2022   CCS DR   Patient Active Problem List   Diagnosis Date Noted   Contraceptive, surveillance, intrauterine device 04/13/2023   Nephrolithiasis 10/21/2022   H. pylori infection 09/25/2022   Abdominal hernia  09/25/2022   Seasonal allergies 09/25/2022   Carpal tunnel syndrome 10/07/2021   Genetic testing 08/14/2019   Breast asymmetry following reconstructive surgery 07/13/2019   Family history of breast cancer    Family history of prostate cancer    Unintentional weight loss 05/21/2019   Lymphedema of right arm 02/27/2018   Dyspnea 09/23/2016   Eczema 08/31/2016   Essential hypertension 11/05/2015   Cervical dystonia 08/25/2015   Headache 07/09/2015   Myofascial muscle pain 04/29/2015   Adhesive capsulitis of right shoulder 04/29/2015   Lower extremity edema  10/01/2014   Malignant neoplasm of overlapping sites of right breast in female, estrogen receptor positive (HCC) 05/24/2014   Myalgia and myositis 05/17/2014   Neoplasm related pain 03/12/2014   Constipation 11/26/2013   H/O reduction mammoplasty 10/30/2013   Seizure (HCC) 10/06/2013   Status post bilateral breast implants 08/31/2013   S/P breast reconstruction, right 08/23/2013   AC joint arthropathy 07/02/2013   Routine adult health maintenance 05/29/2013   Hypokalemia 05/21/2013   Chronic pain 04/19/2013   Acquired absence of breast and absent nipple 01/30/2013   RHINOSINUSITIS, CHRONIC 04/08/2010   Morbid obesity (HCC) 08/04/2006   DEPRESSIVE DISORDER, NOS 08/04/2006    PCP: Laymon Legions, MD  REFERRING PROVIDER: Legions Laymon PARAS, MD   REFERRING DIAG: I89.0 (ICD-10-CM) - Lymphedema of right arm   THERAPY DIAG:  Postmastectomy lymphedema  Stiffness of right shoulder, not elsewhere classified  History of right breast cancer  ONSET DATE: 2011  Rationale for Evaluation and Treatment Rehabilitation  SUBJECTIVE                                                                                                                                                                                           SUBJECTIVE STATEMENT: I've been wearing my garments every day and that's going great. I want to keep coming a little longer to see if we can get my shoulder stronger. I have trouble holding my arm up long enough to wash and comb my hair because my arm I so weak.   PERTINENT HISTORY:  Right breast cancer with mastectomy 2011 and ALND 22 nodes; reconstruction; left breast reduction; h/o right UE lymphedema and right upper quadrant pain, treated in this clinic several times before.  PAIN:  Are you having pain?No not right now  PRECAUTIONS: Other: R UE lymphedema, has metal in ankle  RED FLAGS: none  WEIGHT BEARING RESTRICTIONS No  FALLS:  Has patient fallen in last 6  months? No  LIVING ENVIRONMENT: Lives with: lives with 3 sons Lives in: House/apartment Stairs: No;  Has following equipment at home: None  OCCUPATION:  on disability  LEISURE: trying to exercise by walking but has been bothered by her L ankle pain  HAND DOMINANCE : right   PRIOR LEVEL OF FUNCTION: Independent  PATIENT GOALS to get my arm down and try to maintain it   OBJECTIVE  COGNITION:  Overall cognitive status: Within functional limits for tasks assessed    OBSERVATIONS / OTHER ASSESSMENTS: R upper arm considerable larger than left, area of swelling at posterior trunk just above band on bra  POSTURE: forward head, rounded shoulders  UPPER EXTREMITY AROM/PROM:  A/PROM RIGHT   eval  Right 11/18/23 Right  11/23/23 12/02/23 01/04/24  Shoulder extension 55    54  Shoulder flexion 133 142 145 145 150  Shoulder abduction 85 102 106 162 163  Shoulder internal rotation 40      Shoulder external rotation 60        (Blank rows = not tested)  A/PROM LEFT   eval  Shoulder extension 63  Shoulder flexion 175  Shoulder abduction 171  Shoulder internal rotation 64  Shoulder external rotation 95    (Blank rows = not tested)  MMT: 01/04/24: Rt UE Flex 4/5 and Abd 4-/5     Lt UE 5/5 for flex and abd                LYMPHEDEMA ASSESSMENTS:  SURGERY TYPE/DATE: R mastectomy and ALND in 2011 NUMBER OF LYMPH NODES REMOVED: 1/20 CHEMOTHERAPY: completed RADIATION:completed Jan 2012 HORMONE TREATMENT: anastrozole  from Nov 2016 to Dec 2019 INFECTIONS: none  LYMPHEDEMA ASSESSMENTS:   LANDMARK RIGHT  Eval on 03/16/22 10/25/23 11/11/23 11/16/23 11/23/23 12/16/23  10 cm proximal to olecranon process 47.1 50.7 47.2 47.4 47.1 46.4  Olecranon process 32.6 34.5 34 34.5 34.1 32.7  10 cm proximal to ulnar styloid process 23.6 26 26.4 26.5 25.4 25.3  Just proximal to ulnar styloid process 17.7 18 17.7 17.3 17.4 16.9  Across hand at thumb web space 21.4 22 20.2 20.2 20.3 19.2  At base of  2nd digit 6.1 6.2 5.8 6.1 5.8 5.9  (Blank rows = not tested)  LANDMARK LEFT  Eval on 03/16/22 10/25/23  10 cm proximal to olecranon process 43 46  Olecranon process 30 32.4  10 cm proximal to ulnar styloid process 24.4 26.1  Just proximal to ulnar styloid process 17.3 18.6  Across hand at thumb web space 20.6 22.1  At base of 2nd digit 6.4 6.4  (Blank rows = not tested)   Flowsheet Row Outpatient Rehab from 10/25/2023 in [facility]  Lymphedema Life Impact Scale Total Score 72.06 %      TODAY'S TREATMENT  01/04/24: Therapeutic Exercises Pulleys into flex and abd x 2 mins each focusing on Rt scapular depression throughout Roll yellow ball up wall into flex and abd x 15-20 each Prone Rt UE with 1#: Horz abd, scaption and flex and then ext x 10 each returning therapist, these were very challenging for pt and she had very limited A/ROM but was able to feel muscle engagement in scapular region without compensating; added to HEP Therapeutic Activities Free Motion Machine 7# with arms on setting 4 for Rt UE ext and then bil UE ext x 20, then with arm at 7 for R tUE ext and 3# (unable to do 7#) focusing on controlling scapular rhythm and keeping core engaged throughout each; then tried PNF D1 with 3# and then 7# working to get postural muscles engaged but pt reports only feeling this in shoulder  so stopped Wall Push Ups x 10 Bil UE 3 way raises with back against wall/core engaged, and scapula and head against wall,  2#x 10 each flex, scaption, and abd x 10 each returning therapist demo   12/28/23: Therapeutic Exercises Pulleys into flex and abd x 2 mins each focusing on Rt scapular depression throughout Roll yellow ball up wall into flex and abd x 15-20 each Therapeutic Activities Bil UE 3 way raises with back against wall/core engaged, and scapula and head against wall,  2# x 15 for flex and scaption, and then abd x 10 each returning therapist demo  Free Motion  Machine 7# with arms on setting 4 for bil UE ext 2 x 15 focusing on controlling scapular rhythm and keeping core engaged Wall Push Ups x 10 Manual Therapy P/ROM to Rt shoulder into flex, abd and D2 with scapular depression by therapist throughout STM to scar tissue in Rt axilla, then to Rt UT and medial scap border in Lt S/L Scap Mobs in Lt S/L into protraction and retraction  12/26/23: Orthotics fit Pt brought her new compression day and night garments. Assessed fit of both and educated her in proper donning techniques without overstretching the garment. Had pt return demo a few times to assess technique and instructed her to get a glove with rubber on grip, like a gardening glove, to assist with scooting garment up arm during day when it slides down. Pt verbalized understanding.  Therapeutic Exercises Pulleys into flex and abd x 2 mins each focusing on Rt scapular depression throughout Therapeutic Activities Bil UE 3 way raises with back against wall/core engaged, and scapula and head against wall,  2# x 12 each returning therapist demo  Wall Push Ups x 10 Free Motion Machine 7# with arms on setting 4 for bil UE ext x 12 focusing on controlling scapular rhythm      PATIENT EDUCATION:  Education details: Prone Rt shoulder strengthening Derflinger educated: Patient Education method: Explanation, demonstration, VC's and handout issued Education comprehension: verbalized understanding, returned demonstration, will benefit from further review  HOME EXERCISE PROGRAM: 12/26/23 - Bil UE 3 way raises 01/04/24 - Access Code [facility] for prone Rt shoulder strengthening  ASSESSMENT:  CLINICAL IMPRESSION: Pt has progressed well towards her lymphedema goals. She is wearing her compression garments daily and reports feeling that she is managing her lymphedema well now and would like to switch focus to improving her strength with end reaching like trying to wash and comb her hair. Her MMT is limited when  compared to her non surgical side so goal added for this. She has shown progress towards A/ROM goal so this will be ongoing. Pt is agreeable to decreasing freq to 1x/wk x up to 4 weeks to help her learn HEP for continuing to improve her end A/ROM and strength.   OBJECTIVE IMPAIRMENTS decreased ROM and increased edema.   ACTIVITY LIMITATIONS carrying, lifting, and reach over head  PARTICIPATION LIMITATIONS: none  PERSONAL FACTORS Time since onset of injury/illness/exacerbation are also affecting patient's functional outcome.   REHAB POTENTIAL: Good  CLINICAL DECISION MAKING: Stable/uncomplicated  EVALUATION COMPLEXITY: Low  GOALS: Goals reviewed with patient? Yes  SHORT TERM GOALS: Target date: 11/15/23   Pt will demonstrate a 2 cm reduction in edema at 10 cm proximal to olecranon process.  Baseline: 50.7 cm; 11/23/23 - 47.1 (3.6 cm reduction) Goal status: MET  2.  Pt will demonstrate 110 degrees of R shoulder abduction to allow pt to reach out to the  side.  Baseline: 85; 11/23/23 - 106 Goal status: MET  3.  Pt will obtain a compression bra for long term management of truncal edema especially posterior..  Baseline:  Goal status: MET  4.  Pt will demonstrate 150 degrees of R shoulder flexion to allow her to reach overhead.  Baseline: 133; 11/23/23 - 145 degrees, 12/05/23 - 145 deg; 01/04/24 - 150 deg  Goal status: MET   LONG TERM GOALS: Target date: 02/01/24    Pt will demonstrate a 3 cm reduction in edema at 10 cm proximal to olecranon process.  Baseline: 50.7 cm; 11/23/23 - 47.1 cm (3.6 cm reduction) Goal status: MET  2.  Pt will demonstrate 165 degrees of R shoulder abduction to allow pt to reach out to the side.  Baseline: 85 degrees; 11/23/23 - 106 degrees, 12/05/23 - 165; 01/04/24 - 163 degrees Goal status: ONGOING  3.  Pt will obtain appropriate day time and night time garments for long term management of R UE lymphedema.  Baseline: Needs new garments; 11/23/23 - pt was just  measured today for new day and nighttime garments; 12/26/23 - pt now has new garments Goal status: MET  4.  Pt will be able to independently manage her lymphedema through self MLD and compression garments.  Baseline: Pt is independent with self MLD and has been measured for compression garments; 01/04/24 - pt now has her new compression garments Goal status: MET  5.  Pt will demonstrate 165 degrees of R shoulder flexion to allow her to reach overhead.  Baseline: 133 degrees; 11/23/23 - 145 degrees;  Goal status: ONGOING  6.  Pt will demonstrate improved Rt shoulder strength to 4+/5 to equal Lt for improved ease with washing and combing her hair.  Baseline: Rt shoulder MMT flex 4/5 and abd 4-/5  Goal Status: NEW   PLAN: PT FREQUENCY: 3x/week  PT DURATION: 6 weeks  PLANNED INTERVENTIONS: Therapeutic exercises, Therapeutic activity, Patient/Family education, Self Care, Joint mobilization, Orthotic/Fit training, Manual lymph drainage, Compression bandaging, Vasopneumatic device, Manual therapy, and Re-evaluation  PLAN FOR NEXT SESSION: Renewal decreasing freq to 1x/wk x 4 weeks; Cont with Rt shoulder strength focusing on postural and scapular strength; review new HEP and progress as able   Aden Berwyn Caldron, PTA 01/04/2024, 4:24 PM    3 Way Raises:      Starting Position:  Leaning against wall, walk feet a few inches away from the wall and make tummy tight (tuck hips underneath you) Press back/shoulders/head against wall as much as possible. Keep thumbs up to ceiling, elbows straight and shoulders relaxed/down throughout.  1. Lift arms in front to shoulder height 2. Lift arms a little wider into a V to shoulder height 3. Lift arms out to sides in a T to shoulder height  Perform 10 times in each direction. Hold 1-2 lbs to start with and work up to 2-3 sets of 10/day. Perform 3-4 times/week. Increase weight as able, decreasing sets of 10 each time you increase weights,  then slowly working your way back up to 2-3 sets each time.    Cancer Rehab [phone]

## 2024-01-05 NOTE — Addendum Note (Signed)
 Addended by: LARUE SADDIE SAUNDERS on: 01/05/2024 12:33 PM   Modules accepted: Orders

## 2024-01-19 ENCOUNTER — Telehealth: Payer: Self-pay

## 2024-01-19 ENCOUNTER — Other Ambulatory Visit (HOSPITAL_COMMUNITY): Payer: Self-pay

## 2024-01-19 ENCOUNTER — Encounter: Payer: Self-pay | Admitting: Family Medicine

## 2024-01-19 ENCOUNTER — Other Ambulatory Visit (HOSPITAL_COMMUNITY)
Admission: RE | Admit: 2024-01-19 | Discharge: 2024-01-19 | Disposition: A | Discharge status: Home / Self Care | Source: Ambulatory Visit | Attending: Family Medicine | Admitting: Family Medicine

## 2024-01-19 ENCOUNTER — Ambulatory Visit (INDEPENDENT_AMBULATORY_CARE_PROVIDER_SITE_OTHER): Admitting: Family Medicine

## 2024-01-19 VITALS — BP 140/84 | HR 89 | Ht 64.0 in | Wt 254.6 lb

## 2024-01-19 DIAGNOSIS — I890 Lymphedema, not elsewhere classified: Secondary | ICD-10-CM

## 2024-01-19 DIAGNOSIS — Z23 Encounter for immunization: Secondary | ICD-10-CM | POA: Diagnosis not present

## 2024-01-19 DIAGNOSIS — Z0184 Encounter for antibody response examination: Secondary | ICD-10-CM

## 2024-01-19 DIAGNOSIS — Z113 Encounter for screening for infections with a predominantly sexual mode of transmission: Secondary | ICD-10-CM

## 2024-01-19 DIAGNOSIS — N920 Excessive and frequent menstruation with regular cycle: Secondary | ICD-10-CM | POA: Insufficient documentation

## 2024-01-19 DIAGNOSIS — Z0000 Encounter for general adult medical examination without abnormal findings: Secondary | ICD-10-CM | POA: Diagnosis not present

## 2024-01-19 MED ORDER — WEGOVY 0.5 MG/0.5ML ~~LOC~~ SOAJ: 0.5000 mg | SUBCUTANEOUS | 1 refills | Status: DC

## 2024-01-19 MED ORDER — SEMAGLUTIDE(0.25 OR 0.5MG/DOS) 2 MG/1.5ML ~~LOC~~ SOPN: 0.5000 mg | PEN_INJECTOR | SUBCUTANEOUS | 1 refills | Status: DC

## 2024-01-19 NOTE — Telephone Encounter (Signed)
 Rec'd PA request for Wegovy  medication. Looks like patient has been on Ozempic . Can this be resent to the pharmacy as brand Ozempic ?!  Thanks!

## 2024-01-19 NOTE — Assessment & Plan Note (Signed)
 Obesity management with Ozempic . Current dose is 0.25 mg weekly, considering increase to 0.5 mg weekly. She is tolerating current dose well and making lifestyle changes including diet and exercise. Discussed potential side effects of increased dose such as stomach upset and nausea. - Increase Ozempic  to 0.5 mg weekly - Schedule follow-up in six weeks to assess response to increased dose

## 2024-01-19 NOTE — Progress Notes (Addendum)
  Date of Visit: 01/19/2024   SUBJECTIVE:   HPI:  In[place] presents today for a well woman exam.   Discussed the use of AI scribe software for clinical note transcription with the patient, who gave verbal consent to proceed.  History of Present Illness Karen Hopkins is a 48 year old female who presents for an annual physical exam and follow-up on Ozempic  use.  She is on Ozempic  0.25 mg weekly, currently on her second pen, and experiences constipation as a side effect. She manages this with natural fiber, food fiber, and occasionally a constipation gel. She supplements her health with a multivitamin, protein shakes, water, and electrolytes.  She has a Mirena  IUD since December 2020 for menstrual control, with no current bleeding issues. She is not very sexually active, has had one partner, and was last tested for STIs in November 2024. She uses condoms inconsistently.  She engages in physical activity, including physical therapy exercises, and uses a sleeve for lymphedema, which is beneficial. She does not smoke, drink, or use drugs.  Needs a dentist recommendation.  Mom died of breast cancer, dad had prostate cancer, no other family history of breast cancer.   OBJECTIVE:   BP (!) 137/96   Pulse 89   Ht 5' 4 (1.626 m)   Wt 254 lb 9.6 oz (115.5 kg)   SpO2 100%   BMI 43.70 kg/m  Gen: NAD, pleasant, cooperative HEENT: NCAT, no palpable thyromegaly or anterior cervical lymphadenopathy Heart: RRR, no murmurs Lungs: CTAB, NWOB Abdomen: soft, nontender to palpation Neuro: grossly nonfocal, speech normal GU: normal appearing external genitalia without lesions. Vagina is moist with white discharge and some small spotting. Cervix normal in appearance. Unable to fully visualize cervix, could not see IUD strings as could not see os. Small clogged pores/folliculis present on vulva, no signs of infection. No cervical motion tenderness or tenderness on bimanual exam. No adnexal masses.  Chaperone present - Casimir Griffins CMA  ASSESSMENT/PLAN:   Assessment & Plan Routine adult health maintenance Routine adult wellness visit conducted. She is sexually active with one partner and has had a new partner since last STI testing. Pap smear was normal last year. Current on mammograms and colonoscopy is due in 2027. - Perform pelvic exam and test for gonorrhea, chlamydia, and trichomonas - Order blood work for HIV and syphilis - Second dose of HPV vaccine given today - Check immunity to hepatitis B - Advise to obtain tetanus shot at pharmacy - Handout given on health maintenance topics. Morbid obesity (HCC) Obesity management with Ozempic . Current dose is 0.25 mg weekly, considering increase to 0.5 mg weekly. She is tolerating current dose well and making lifestyle changes including diet and exercise. Discussed potential side effects of increased dose such as stomach upset and nausea. - Increase Ozempic  to 0.5 mg weekly - Schedule follow-up in six weeks to assess response to increased dose Menorrhagia with regular cycle Menorrhagia controlled with Mirena  IUD placed in December 2020, effective for up to eight years. No current bleeding issues reported. Discussed that hormone levels taper after five years, and replacement may be needed if bleeding issues arise. Lymphedema of right arm Lymphedema of the right arm managed with compression sleeve. She reports improvement with use of the sleeve.  Vulvar bumps - recommend warm compress/sitz baths. No drainable collections today or signs of infection.  FOLLOW UP: Follow up in 6 weeks for obesity  [place] J. Donah, MD [facility]

## 2024-01-19 NOTE — Addendum Note (Signed)
 Addended by: NORVILLE CASIMIR BROCKS on: 01/19/2024 11:09 AM   Modules accepted: Orders

## 2024-01-19 NOTE — Assessment & Plan Note (Addendum)
 Routine adult wellness visit conducted. She is sexually active with one partner and has had a new partner since last STI testing. Pap smear was normal last year. Current on mammograms and colonoscopy is due in 2027. - Perform pelvic exam and test for gonorrhea, chlamydia, and trichomonas - Order blood work for HIV and syphilis - Second dose of HPV vaccine given today - Check immunity to hepatitis B - Advise to obtain tetanus shot at pharmacy - Handout given on health maintenance topics.

## 2024-01-19 NOTE — Assessment & Plan Note (Signed)
 Menorrhagia controlled with Mirena  IUD placed in December 2020, effective for up to eight years. No current bleeding issues reported. Discussed that hormone levels taper after five years, and replacement may be needed if bleeding issues arise.

## 2024-01-19 NOTE — Patient Instructions (Signed)
 It was great to see you again today.  VISIT SUMMARY:  Today, you had your annual physical exam and follow-up on your Ozempic  use. We discussed your current medications, lifestyle habits, and any side effects you are experiencing. We also reviewed your sexual health and performed necessary tests and vaccinations.  YOUR PLAN:  -ADULT WELLNESS VISIT: This visit was to check your overall health and perform routine screenings. We performed a pelvic exam and tested for gonorrhea, chlamydia, and trichomonas. Blood work was ordered for HIV and syphilis. You received the second dose of the HPV vaccine, and we checked your immunity to hepatitis B. Please obtain a tetanus shot at the pharmacy.  -OBESITY: Obesity is a condition where you have excess body fat. We are managing this with Ozempic , and you are currently on 0.25 mg weekly. We discussed increasing your dose to 0.5 mg weekly to help with weight management. Please monitor for any side effects like stomach upset or nausea. We will follow up in six weeks to see how you are responding to the increased dose.  -MENORRHAGIA CONTROLLED WITH MIRENA  IUD: Menorrhagia is heavy menstrual bleeding. Your Mirena  IUD, placed in December 2020, is effectively controlling this. It is effective for up to eight years, but hormone levels may taper after five years. If bleeding issues arise, we may need to replace it.  INSTRUCTIONS:  Please follow up in six weeks to assess your response to the increased dose of Ozempic . Obtain a tetanus shot at the pharmacy. Continue with your current management strategies for constipation and lymphedema. Maintain your lifestyle changes to manage prediabetes. If you experience any new symptoms or have concerns, please contact our office.  Be well, Dr. Donah  Health Maintenance, Female Adopting a healthy lifestyle and getting preventive care are important in promoting health and wellness. Ask your health care provider about: The right  schedule for you to have regular tests and exams. Things you can do on your own to prevent diseases and keep yourself healthy. What should I know about diet, weight, and exercise? Eat a healthy diet  Eat a diet that includes plenty of vegetables, fruits, low-fat dairy products, and lean protein. Do not eat a lot of foods that are high in solid fats, added sugars, or sodium. Maintain a healthy weight Body mass index (BMI) is used to identify weight problems. It estimates body fat based on height and weight. Your health care provider can help determine your BMI and help you achieve or maintain a healthy weight. Get regular exercise Get regular exercise. This is one of the most important things you can do for your health. Most adults should: Exercise for at least 150 minutes each week. The exercise should increase your heart rate and make you sweat (moderate-intensity exercise). Do strengthening exercises at least twice a week. This is in addition to the moderate-intensity exercise. Spend less time sitting. Even light physical activity can be beneficial. Watch cholesterol and blood lipids Have your blood tested for lipids and cholesterol at 46 years of age, then have this test every 5 years. Have your cholesterol levels checked more often if: Your lipid or cholesterol levels are high. You are older than 46 years of age. You are at high risk for heart disease. What should I know about cancer screening? Depending on your health history and family history, you may need to have cancer screening at various ages. This may include screening for: Breast cancer. Cervical cancer. Colorectal cancer. Skin cancer. Lung cancer. What should I  know about heart disease, diabetes, and high blood pressure? Blood pressure and heart disease High blood pressure causes heart disease and increases the risk of stroke. This is more likely to develop in people who have high blood pressure readings or are  overweight. Have your blood pressure checked: Every 3-5 years if you are 60-71 years of age. Every year if you are 94 years old or older. Diabetes Have regular diabetes screenings. This checks your fasting blood sugar level. Have the screening done: Once every three years after age 41 if you are at a normal weight and have a low risk for diabetes. More often and at a younger age if you are overweight or have a high risk for diabetes. What should I know about preventing infection? Hepatitis B If you have a higher risk for hepatitis B, you should be screened for this virus. Talk with your health care provider to find out if you are at risk for hepatitis B infection. Hepatitis C Testing is recommended for: Everyone born from [birth year] through 1963. Anyone with known risk factors for hepatitis C. Sexually transmitted infections (STIs) Get screened for STIs, including gonorrhea and chlamydia, if: You are sexually active and are younger than 46 years of age. You are older than 46 years of age and your health care provider tells you that you are at risk for this type of infection. Your sexual activity has changed since you were last screened, and you are at increased risk for chlamydia or gonorrhea. Ask your health care provider if you are at risk. Ask your health care provider about whether you are at high risk for HIV. Your health care provider may recommend a prescription medicine to help prevent HIV infection. If you choose to take medicine to prevent HIV, you should first get tested for HIV. You should then be tested every 3 months for as long as you are taking the medicine. Pregnancy If you are about to stop having your period (premenopausal) and you may become pregnant, seek counseling before you get pregnant. Take 400 to 800 micrograms (mcg) of folic acid every day if you become pregnant. Ask for birth control (contraception) if you want to prevent pregnancy. Osteoporosis and  menopause Osteoporosis is a disease in which the bones lose minerals and strength with aging. This can result in bone fractures. If you are 10 years old or older, or if you are at risk for osteoporosis and fractures, ask your health care provider if you should: Be screened for bone loss. Take a calcium or vitamin D supplement to lower your risk of fractures. Be given hormone replacement therapy (HRT) to treat symptoms of menopause. Follow these instructions at home: Alcohol  use Do not drink alcohol  if: Your health care provider tells you not to drink. You are pregnant, may be pregnant, or are planning to become pregnant. If you drink alcohol : Limit how much you have to: 0-1 drink a day. Know how much alcohol  is in your drink. In the [place] one drink equals one 12 oz bottle of beer (355 mL), one 5 oz glass of wine (148 mL), or one 1 oz glass of hard liquor (44 mL). Lifestyle Do not use any products that contain nicotine or tobacco. These products include cigarettes, chewing tobacco, and vaping devices, such as e-cigarettes. If you need help quitting, ask your health care provider. Do not use street drugs. Do not share needles. Ask your health care provider for help if you need support or information about quitting drugs.  General instructions Schedule regular health, dental, and eye exams. Stay current with your vaccines. Tell your health care provider if: You often feel depressed. You have ever been abused or do not feel safe at home. Summary Adopting a healthy lifestyle and getting preventive care are important in promoting health and wellness. Follow your health care provider's instructions about healthy diet, exercising, and getting tested or screened for diseases. Follow your health care provider's instructions on monitoring your cholesterol and blood pressure. This information is not intended to replace advice given to you by your health care provider. Make sure you discuss any  questions you have with your health care provider. Document Revised: 10/13/2020 Document Reviewed: 10/13/2020 Elsevier Patient Education  2024 [employer].

## 2024-01-19 NOTE — Assessment & Plan Note (Signed)
 Lymphedema of the right arm managed with compression sleeve. She reports improvement with use of the sleeve.

## 2024-01-20 ENCOUNTER — Ambulatory Visit: Payer: Self-pay | Admitting: Family Medicine

## 2024-01-20 LAB — SYPHILIS: RPR W/REFLEX TO RPR TITER AND TREPONEMAL ANTIBODIES, TRADITIONAL SCREENING AND DIAGNOSIS ALGORITHM: RPR Ser Ql: NONREACTIVE

## 2024-01-20 LAB — CERVICOVAGINAL ANCILLARY ONLY
Chlamydia: NEGATIVE
Comment: NEGATIVE
Comment: NEGATIVE
Comment: NORMAL
Neisseria Gonorrhea: NEGATIVE
Trichomonas: NEGATIVE

## 2024-01-20 LAB — HIV ANTIBODY (ROUTINE TESTING W REFLEX): HIV Screen 4th Generation wRfx: NONREACTIVE

## 2024-01-20 LAB — HEPATITIS B SURFACE ANTIBODY, QUANTITATIVE: Hepatitis B Surf Ab Quant: <3.5 m[IU]/mL — ABNORMAL LOW

## 2024-01-20 MED ORDER — OZEMPIC (0.25 OR 0.5 MG/DOSE) 2 MG/1.5ML ~~LOC~~ SOPN: 0.5000 mg | PEN_INJECTOR | SUBCUTANEOUS | 1 refills | Status: DC

## 2024-01-23 ENCOUNTER — Ambulatory Visit: Attending: Family Medicine

## 2024-01-23 DIAGNOSIS — M25611 Stiffness of right shoulder, not elsewhere classified: Secondary | ICD-10-CM | POA: Insufficient documentation

## 2024-01-23 DIAGNOSIS — I972 Postmastectomy lymphedema syndrome: Secondary | ICD-10-CM | POA: Insufficient documentation

## 2024-01-23 DIAGNOSIS — Z853 Personal history of malignant neoplasm of breast: Secondary | ICD-10-CM | POA: Diagnosis not present

## 2024-01-23 NOTE — Therapy (Signed)
 OUTPATIENT PHYSICAL THERAPY ONCOLOGY TREATMENT     Patient Name: Karen Hopkins MRN: [id number] DOB:Mar 04, 1976, 48 y.o., female Today's Date: 01/23/2024   PT End of Session - 01/23/24 1408     Visit Number 24    Number of Visits 31    Date for PT Re-Evaluation 02/01/24    PT Start Time 1402    PT Stop Time 1458    PT Time Calculation (min) 56 min    Activity Tolerance Patient tolerated treatment well    Behavior During Therapy [facility] for tasks assessed/performed              Past Medical History:  Diagnosis Date   Anxiety    Arthritis    Cancer (HCC) 2011   Rt. Br. Ca   Depression    Diabetes mellitus without complication (HCC)    has been borderline   Essential hypertension 11/05/2015   H. pylori infection    History of radiation therapy    Hypertension    under control with med., has been on med. x 2 yr.   Lymphedema of arm    right; no BP or puncture to right arm   Personal history of chemotherapy 09/16/2009   rt breast   Pneumonia    Seasonal allergies    Sinus headache    Past Surgical History:  Procedure Laterality Date   BREAST BIOPSY Right 08/26/2009   BREAST REDUCTION SURGERY Left 08/23/2013   Procedure: LEFT BREAST REDUCTION  ;  Surgeon: Estefana Reichert, DO;  Location: [facility];  Service: Plastics;  Laterality: Left;   BREAST REDUCTION WITH MASTOPEXY Left 08/22/2019   Procedure: Left breast mastopexy reduction with liposuction for breast symmetry;  Surgeon: Lowery Estefana RAMAN, DO;  Location: [facility];  Service: Plastics;  Laterality: Left;   CESAREAN SECTION  07/15/2001; 03/18/2004; 11/21/2008   lateral orbiotomy Left 11/2015   Dr. Melba at [facility]   LATISSIMUS FLAP TO BREAST Right 03/12/2013   Procedure: RIGHT BREAST LATISSIMUS FLAP WITH EXPANDER PLACEMENT;  Surgeon: Estefana Reichert, DO;  Location: [facility];  Service: Plastics;  Laterality: Right;   LIPOSUCTION Bilateral 08/23/2013   Procedure: LIPOSUCTION;  Surgeon:  Estefana Reichert, DO;  Location: [facility];  Service: Plastics;  Laterality: Bilateral;   LIPOSUCTION WITH LIPOFILLING Left 08/22/2019   Procedure: LIPOSUCTION WITH LIPOFILLING;  Surgeon: Lowery Estefana RAMAN, DO;  Location: [facility];  Service: Plastics;  Laterality: Left;   MASTECTOMY Right 2011   MODIFIED RADICAL MASTECTOMY Right 03/11/2010   NASAL SEPTUM SURGERY     ORIF ANKLE FRACTURE Left 07/18/2018   Procedure: OPEN REDUCTION INTERNAL FIXATION (ORIF) LEFT ANKLE FRACTURE WITH  SYNDESMOSIS AND ANKLE ARTHROTOMY;  Surgeon: Elsa Lonni SAUNDERS, MD;  Location: [facility];  Service: Orthopedics;  Laterality: Left;   PORT-A-CATH REMOVAL Left 12/01/2010   PORTACATH PLACEMENT Left 09/12/2009   REDUCTION MAMMAPLASTY Left 08/2013   REMOVAL OF TISSUE EXPANDER AND PLACEMENT OF IMPLANT Right 08/23/2013   Procedure: REMOVAL RIGHT TISSUE EXPANDER AND PLACEMENT OF IMPLANT TO RIGHT BREAST ;  Surgeon: Estefana Reichert, DO;  Location:  SURGERY CENTER;  Service: Plastics;  Laterality: Right;   SUPRA-UMBILICAL HERNIA  2006   TUBAL LIGATION  11/21/2008   UMBILICAL HERNIA REPAIR  04/12/2022   [facility]S DR   Patient Active Problem List   Diagnosis Date Noted   Menorrhagia 01/19/2024   Contraceptive, surveillance, intrauterine device 04/13/2023   Nephrolithiasis 10/21/2022   H. pylori infection 09/25/2022  Abdominal hernia 09/25/2022   Seasonal allergies 09/25/2022   Carpal tunnel syndrome 10/07/2021   Genetic testing 08/14/2019   Breast asymmetry following reconstructive surgery 07/13/2019   Family history of breast cancer    Family history of prostate cancer    Unintentional weight loss 05/21/2019   Lymphedema of right arm 02/27/2018   Dyspnea 09/23/2016   Eczema 08/31/2016   Essential hypertension 11/05/2015   Cervical dystonia 08/25/2015   Headache 07/09/2015   Myofascial muscle pain 04/29/2015   Adhesive capsulitis of right shoulder 04/29/2015    Lower extremity edema 10/01/2014   Malignant neoplasm of overlapping sites of right breast in female, estrogen receptor positive (HCC) 05/24/2014   Myalgia and myositis 05/17/2014   Neoplasm related pain 03/12/2014   Constipation 11/26/2013   H/O reduction mammoplasty 10/30/2013   Seizure (HCC) 10/06/2013   Status post bilateral breast implants 08/31/2013   S/P breast reconstruction, right 08/23/2013   AC joint arthropathy 07/02/2013   Routine adult health maintenance 05/29/2013   Hypokalemia 05/21/2013   Chronic pain 04/19/2013   Acquired absence of breast and absent nipple 01/30/2013   RHINOSINUSITIS, CHRONIC 04/08/2010   Morbid obesity (HCC) 08/04/2006   DEPRESSIVE DISORDER, NOS 08/04/2006    PCP: Laymon Legions, MD  REFERRING PROVIDER: Legions Laymon PARAS, MD   REFERRING DIAG: I89.0 (ICD-10-CM) - Lymphedema of right arm   THERAPY DIAG:  Postmastectomy lymphedema  Stiffness of right shoulder, not elsewhere classified  History of right breast cancer  ONSET DATE: 2011  Rationale for Evaluation and Treatment Rehabilitation  SUBJECTIVE                                                                                                                                                                                           SUBJECTIVE STATEMENT: My shoulder has been feeling better. I haven't done as many of the exercises but my pain has been a lot better.   PERTINENT HISTORY:  Right breast cancer with mastectomy 2011 and ALND 22 nodes; reconstruction; left breast reduction; h/o right UE lymphedema and right upper quadrant pain, treated in this clinic several times before.  PAIN:  Are you having pain?No not right now  PRECAUTIONS: Other: R UE lymphedema, has metal in ankle  RED FLAGS: none  WEIGHT BEARING RESTRICTIONS No  FALLS:  Has patient fallen in last 6 months? No  LIVING ENVIRONMENT: Lives with: lives with 3 sons Lives in: House/apartment Stairs: No;  Has  following equipment at home: None  OCCUPATION: on disability  LEISURE: trying to exercise by walking but has been bothered by her L ankle pain  HAND DOMINANCE : right  PRIOR LEVEL OF FUNCTION: Independent  PATIENT GOALS to get my arm down and try to maintain it   OBJECTIVE  COGNITION:  Overall cognitive status: Within functional limits for tasks assessed    OBSERVATIONS / OTHER ASSESSMENTS: R upper arm considerable larger than left, area of swelling at posterior trunk just above band on bra  POSTURE: forward head, rounded shoulders  UPPER EXTREMITY AROM/PROM:  A/PROM RIGHT   eval  Right 11/18/23 Right  11/23/23 12/02/23 01/04/24  Shoulder extension 55    54  Shoulder flexion 133 142 145 145 150  Shoulder abduction 85 102 106 162 163  Shoulder internal rotation 40      Shoulder external rotation 60        (Blank rows = not tested)  A/PROM LEFT   eval  Shoulder extension 63  Shoulder flexion 175  Shoulder abduction 171  Shoulder internal rotation 64  Shoulder external rotation 95    (Blank rows = not tested)  MMT: 01/04/24: Rt UE Flex 4/5 and Abd 4-/5     Lt UE 5/5 for flex and abd                LYMPHEDEMA ASSESSMENTS:  SURGERY TYPE/DATE: R mastectomy and ALND in 2011 NUMBER OF LYMPH NODES REMOVED: 1/20 CHEMOTHERAPY: completed RADIATION:completed Jan 2012 HORMONE TREATMENT: anastrozole  from Nov 2016 to Dec 2019 INFECTIONS: none  LYMPHEDEMA ASSESSMENTS:   LANDMARK RIGHT  Eval on 03/16/22 10/25/23 11/11/23 11/16/23 11/23/23 12/16/23  10 cm proximal to olecranon process 47.1 50.7 47.2 47.4 47.1 46.4  Olecranon process 32.6 34.5 34 34.5 34.1 32.7  10 cm proximal to ulnar styloid process 23.6 26 26.4 26.5 25.4 25.3  Just proximal to ulnar styloid process 17.7 18 17.7 17.3 17.4 16.9  Across hand at thumb web space 21.4 22 20.2 20.2 20.3 19.2  At base of 2nd digit 6.1 6.2 5.8 6.1 5.8 5.9  (Blank rows = not tested)  LANDMARK LEFT  Eval on 03/16/22 10/25/23  10 cm  proximal to olecranon process 43 46  Olecranon process 30 32.4  10 cm proximal to ulnar styloid process 24.4 26.1  Just proximal to ulnar styloid process 17.3 18.6  Across hand at thumb web space 20.6 22.1  At base of 2nd digit 6.4 6.4  (Blank rows = not tested)   Flowsheet Row Outpatient Rehab from 10/25/2023 in [facility]  Lymphedema Life Impact Scale Total Score 72.06 %      TODAY'S TREATMENT  01/23/24: Therapeutic Exercises Pulleys into flex and abd x 2 mins each focusing on Rt scapular depression throughout Roll yellow ball up wall into flex and abd x 15-20 each Doorway Pectoralis Stretch (after Machine) x 3 reps, 20 sec holds Therapeutic Activities Free Motion Machine 7# with arms on setting 4 for Rt UE ext and then bil UE ext x 20, then Rt UE trial of 7# with tactile cuing by therapist on shoulder to help decrease Rt scap compensation, very challenging for pt and unable to stop compensation without cuing, then 3# x 10 reps, no compensations; PNF D1 with 3# and then 7# working to get postural muscles engaged, pt reports feeling this in lateral trunk today. Focused on controlling scapular rhythm and keeping core engaged throughout each. Wall Push Ups 2 x 10 Bil UE 3 way raises with back against wall/core engaged, and scapula and head against wall,  3# for flex and 2# for scaption and abd x 13 each except x 10 of  abd returning therapist demo  Supine scap punch 3#, x 20 with tactile cues for correct muscle engagement 3# elbow extension for tricep strengthening which pt reports feels weak when she does her hair bc it's hard to hold her arm up for any period of time Manual Therapy P/ROM to Rt shoulder into flex, abd and D2 with scapular depression by therapist throughout STM to scar tissue in Rt axilla   01/04/24: Therapeutic Exercises Pulleys into flex and abd x 2 mins each focusing on Rt scapular depression throughout Roll yellow ball up wall into flex  and abd x 15-20 each Prone Rt UE with 1#: Horz abd, scaption and flex and then ext x 10 each returning therapist, these were very challenging for pt and she had very limited A/ROM but was able to feel muscle engagement in scapular region without compensating; added to HEP Therapeutic Activities Free Motion Machine 7# with arms on setting 4 for Rt UE ext and then bil UE ext x 20, then with arm at 7 for R tUE ext and 3# (unable to do 7#) focusing on controlling scapular rhythm and keeping core engaged throughout each; then tried PNF D1 with 3# and then 7# working to get postural muscles engaged but pt reports only feeling this in shoulder so stopped Wall Push Ups x 10 Bil UE 3 way raises with back against wall/core engaged, and scapula and head against wall,  2#x 10 each flex, scaption, and abd x 10 each returning therapist demo   12/28/23: Therapeutic Exercises Pulleys into flex and abd x 2 mins each focusing on Rt scapular depression throughout Roll yellow ball up wall into flex and abd x 15-20 each Therapeutic Activities Bil UE 3 way raises with back against wall/core engaged, and scapula and head against wall,  2# x 15 for flex and scaption, and then abd x 10 each returning therapist demo  Free Motion Machine 7# with arms on setting 4 for bil UE ext 2 x 15 focusing on controlling scapular rhythm and keeping core engaged Wall Push Ups x 10 Manual Therapy P/ROM to Rt shoulder into flex, abd and D2 with scapular depression by therapist throughout STM to scar tissue in Rt axilla, then to Rt UT and medial scap border in Lt S/L Scap Mobs in Lt S/L into protraction and retraction      PATIENT EDUCATION:  Education details: Prone Rt shoulder strengthening Mcdiarmid educated: Patient Education method: Explanation, demonstration, VC's and handout issued Education comprehension: verbalized understanding, returned demonstration, will benefit from further review  HOME EXERCISE PROGRAM: 12/26/23 - Bil  UE 3 way raises 01/04/24 - Access Code [facility] for prone Rt shoulder strengthening  ASSESSMENT:  CLINICAL IMPRESSION: Continued with focus on Rt UE and postural strength. Able to progress weights with flex duing bil Ue 3 way and pt reports able to feel lateral trunk muscle engagement with postural strength on Free Motion Machine. Encouraged pt to work towards increased compliance with HEP to see more follow through.    OBJECTIVE IMPAIRMENTS decreased ROM and increased edema.   ACTIVITY LIMITATIONS carrying, lifting, and reach over head  PARTICIPATION LIMITATIONS: none  PERSONAL FACTORS Time since onset of injury/illness/exacerbation are also affecting patient's functional outcome.   REHAB POTENTIAL: Good  CLINICAL DECISION MAKING: Stable/uncomplicated  EVALUATION COMPLEXITY: Low  GOALS: Goals reviewed with patient? Yes  SHORT TERM GOALS: Target date: 11/15/23   Pt will demonstrate a 2 cm reduction in edema at 10 cm proximal to olecranon process.  Baseline: 50.7  cm; 11/23/23 - 47.1 (3.6 cm reduction) Goal status: MET  2.  Pt will demonstrate 110 degrees of R shoulder abduction to allow pt to reach out to the side.  Baseline: 85; 11/23/23 - 106 Goal status: MET  3.  Pt will obtain a compression bra for long term management of truncal edema especially posterior..  Baseline:  Goal status: MET  4.  Pt will demonstrate 150 degrees of R shoulder flexion to allow her to reach overhead.  Baseline: 133; 11/23/23 - 145 degrees, 12/05/23 - 145 deg; 01/04/24 - 150 deg  Goal status: MET   LONG TERM GOALS: Target date: 02/01/24    Pt will demonstrate a 3 cm reduction in edema at 10 cm proximal to olecranon process.  Baseline: 50.7 cm; 11/23/23 - 47.1 cm (3.6 cm reduction) Goal status: MET  2.  Pt will demonstrate 165 degrees of R shoulder abduction to allow pt to reach out to the side.  Baseline: 85 degrees; 11/23/23 - 106 degrees, 12/05/23 - 165; 01/04/24 - 163 degrees Goal status:  ONGOING  3.  Pt will obtain appropriate day time and night time garments for long term management of R UE lymphedema.  Baseline: Needs new garments; 11/23/23 - pt was just measured today for new day and nighttime garments; 12/26/23 - pt now has new garments Goal status: MET  4.  Pt will be able to independently manage her lymphedema through self MLD and compression garments.  Baseline: Pt is independent with self MLD and has been measured for compression garments; 01/04/24 - pt now has her new compression garments Goal status: MET  5.  Pt will demonstrate 165 degrees of R shoulder flexion to allow her to reach overhead.  Baseline: 133 degrees; 11/23/23 - 145 degrees;  Goal status: ONGOING  6.  Pt will demonstrate improved Rt shoulder strength to 4+/5 to equal Lt for improved ease with washing and combing her hair.  Baseline: Rt shoulder MMT flex 4/5 and abd 4-/5  Goal Status: NEW   PLAN: PT FREQUENCY: 1x per week   PT DURATION: 4 weeks   PLANNED INTERVENTIONS: Therapeutic exercises, Therapeutic activity, Patient/Family education, Self Care, Joint mobilization, Orthotic/Fit training, Manual lymph drainage, Compression bandaging, Vasopneumatic device, Manual therapy, and Re-evaluation  PLAN FOR NEXT SESSION: Cont with Rt shoulder strength focusing on postural and scapular strength; review new HEP and progress as able and encourage consistency with pt   Aden Berwyn Caldron, PTA 01/23/2024, 3:01 PM    3 Way Raises:      Starting Position:  Leaning against wall, walk feet a few inches away from the wall and make tummy tight (tuck hips underneath you) Press back/shoulders/head against wall as much as possible. Keep thumbs up to ceiling, elbows straight and shoulders relaxed/down throughout.  1. Lift arms in front to shoulder height 2. Lift arms a little wider into a V to shoulder height 3. Lift arms out to sides in a T to shoulder height  Perform 10 times in each  direction. Hold 1-2 lbs to start with and work up to 2-3 sets of 10/day. Perform 3-4 times/week. Increase weight as able, decreasing sets of 10 each time you increase weights, then slowly working your way back up to 2-3 sets each time.    Cancer Rehab [phone]

## 2024-02-01 ENCOUNTER — Ambulatory Visit

## 2024-02-01 DIAGNOSIS — I972 Postmastectomy lymphedema syndrome: Secondary | ICD-10-CM

## 2024-02-01 DIAGNOSIS — M25611 Stiffness of right shoulder, not elsewhere classified: Secondary | ICD-10-CM

## 2024-02-01 DIAGNOSIS — Z853 Personal history of malignant neoplasm of breast: Secondary | ICD-10-CM

## 2024-02-01 NOTE — Therapy (Signed)
 OUTPATIENT PHYSICAL THERAPY ONCOLOGY TREATMENT     Patient Name: Karen Hopkins MRN: [id number] DOB:[birth year], 48 y.o., female Today's Date: 02/01/2024   PT End of Session - 02/01/24 1407     Visit Number 25    Number of Visits 35    Date for PT Re-Evaluation 02/29/24    PT Start Time 1404    PT Stop Time 1500    PT Time Calculation (min) 56 min    Activity Tolerance Patient tolerated treatment well    Behavior During Therapy [facility] for tasks assessed/performed              Past Medical History:  Diagnosis Date   Anxiety    Arthritis    Cancer (HCC) 2011   Rt. Br. Ca   Depression    Diabetes mellitus without complication (HCC)    has been borderline   Essential hypertension 11/05/2015   H. pylori infection    History of radiation therapy    Hypertension    under control with med., has been on med. x 2 yr.   Lymphedema of arm    right; no BP or puncture to right arm   Personal history of chemotherapy 09/16/2009   rt breast   Pneumonia    Seasonal allergies    Sinus headache    Past Surgical History:  Procedure Laterality Date   BREAST BIOPSY Right 08/26/2009   BREAST REDUCTION SURGERY Left 08/23/2013   Procedure: LEFT BREAST REDUCTION  ;  Surgeon: Estefana Reichert, DO;  Location: [facility];  Service: Plastics;  Laterality: Left;   BREAST REDUCTION WITH MASTOPEXY Left 08/22/2019   Procedure: Left breast mastopexy reduction with liposuction for breast symmetry;  Surgeon: Lowery Estefana RAMAN, DO;  Location: [facility];  Service: Plastics;  Laterality: Left;   CESAREAN SECTION  07/15/2001; 03/18/2004; 11/21/2008   lateral orbiotomy Left 11/2015   Dr. Melba at [facility]   LATISSIMUS FLAP TO BREAST Right 03/12/2013   Procedure: RIGHT BREAST LATISSIMUS FLAP WITH EXPANDER PLACEMENT;  Surgeon: Estefana Reichert, DO;  Location: [facility];  Service: Plastics;  Laterality: Right;   LIPOSUCTION Bilateral 08/23/2013   Procedure: LIPOSUCTION;  Surgeon:  Estefana Reichert, DO;  Location: [facility];  Service: Plastics;  Laterality: Bilateral;   LIPOSUCTION WITH LIPOFILLING Left 08/22/2019   Procedure: LIPOSUCTION WITH LIPOFILLING;  Surgeon: Lowery Estefana RAMAN, DO;  Location: [facility];  Service: Plastics;  Laterality: Left;   MASTECTOMY Right 2011   MODIFIED RADICAL MASTECTOMY Right 03/11/2010   NASAL SEPTUM SURGERY     ORIF ANKLE FRACTURE Left 07/18/2018   Procedure: OPEN REDUCTION INTERNAL FIXATION (ORIF) LEFT ANKLE FRACTURE WITH  SYNDESMOSIS AND ANKLE ARTHROTOMY;  Surgeon: Elsa Lonni SAUNDERS, MD;  Location: [facility];  Service: Orthopedics;  Laterality: Left;   PORT-A-CATH REMOVAL Left 12/01/2010   PORTACATH PLACEMENT Left 09/12/2009   REDUCTION MAMMAPLASTY Left 08/2013   REMOVAL OF TISSUE EXPANDER AND PLACEMENT OF IMPLANT Right 08/23/2013   Procedure: REMOVAL RIGHT TISSUE EXPANDER AND PLACEMENT OF IMPLANT TO RIGHT BREAST ;  Surgeon: Estefana Reichert, DO;  Location: [facility];  Service: Plastics;  Laterality: Right;   SUPRA-UMBILICAL HERNIA  2006   TUBAL LIGATION  11/21/2008   UMBILICAL HERNIA REPAIR  04/12/2022   CCS DR   Patient Active Problem List   Diagnosis Date Noted   Menorrhagia 01/19/2024   Contraceptive, surveillance, intrauterine device 04/13/2023   Nephrolithiasis 10/21/2022   H. pylori infection 09/25/2022  Abdominal hernia 09/25/2022   Seasonal allergies 09/25/2022   Carpal tunnel syndrome 10/07/2021   Genetic testing 08/14/2019   Breast asymmetry following reconstructive surgery 07/13/2019   Family history of breast cancer    Family history of prostate cancer    Unintentional weight loss 05/21/2019   Lymphedema of right arm 02/27/2018   Dyspnea 09/23/2016   Eczema 08/31/2016   Essential hypertension 11/05/2015   Cervical dystonia 08/25/2015   Headache 07/09/2015   Myofascial muscle pain 04/29/2015   Adhesive capsulitis of right shoulder 04/29/2015    Lower extremity edema 10/01/2014   Malignant neoplasm of overlapping sites of right breast in female, estrogen receptor positive (HCC) 05/24/2014   Myalgia and myositis 05/17/2014   Neoplasm related pain 03/12/2014   Constipation 11/26/2013   H/O reduction mammoplasty 10/30/2013   Seizure (HCC) 10/06/2013   Status post bilateral breast implants 08/31/2013   S/P breast reconstruction, right 08/23/2013   AC joint arthropathy 07/02/2013   Routine adult health maintenance 05/29/2013   Hypokalemia 05/21/2013   Chronic pain 04/19/2013   Acquired absence of breast and absent nipple 01/30/2013   RHINOSINUSITIS, CHRONIC 04/08/2010   Morbid obesity (HCC) 08/04/2006   DEPRESSIVE DISORDER, NOS 08/04/2006    PCP: Laymon Legions, MD  REFERRING PROVIDER: Legions Laymon PARAS, MD   REFERRING DIAG: I89.0 (ICD-10-CM) - Lymphedema of right arm   THERAPY DIAG:  Postmastectomy lymphedema  Stiffness of right shoulder, not elsewhere classified  History of right breast cancer  ONSET DATE: 2011  Rationale for Evaluation and Treatment Rehabilitation  SUBJECTIVE                                                                                                                                                                                           SUBJECTIVE STATEMENT: A nurse from [facility] came to my house and she did some kind of natural treatment for my lymphedema and arm pain. It was a lot of light tapping on my arm and axilla, maybe reflexology but it helped my pain. I'll look at my paperwork and see what she did. I've been doing the new exercises you showed me laying prone and trying to lift my arm out to the side and then off the bed. I can feel the shoulder blade muscles trying to work. Also been working on my stretches during the day as often as I can. I really want to keep coming because I can tell I'm getting stronger and doing better with compensating less when I lift my arm. I saw Dr. Legions on  01/19/24 and she wants me to cont as well as she can see my progress.  PERTINENT HISTORY:  Right breast cancer with mastectomy 2011 and ALND 22 nodes; reconstruction; left breast reduction; h/o right UE lymphedema and right upper quadrant pain, treated in this clinic several times before.  PAIN:  Are you having pain?No not right now  PRECAUTIONS: Other: R UE lymphedema, has metal in ankle  RED FLAGS: none  WEIGHT BEARING RESTRICTIONS No  FALLS:  Has patient fallen in last 6 months? No  LIVING ENVIRONMENT: Lives with: lives with 3 sons Lives in: House/apartment Stairs: No;  Has following equipment at home: None  OCCUPATION: on disability  LEISURE: trying to exercise by walking but has been bothered by her L ankle pain  HAND DOMINANCE : right   PRIOR LEVEL OF FUNCTION: Independent  PATIENT GOALS to get my arm down and try to maintain it   OBJECTIVE  COGNITION:  Overall cognitive status: Within functional limits for tasks assessed    OBSERVATIONS / OTHER ASSESSMENTS: R upper arm considerable larger than left, area of swelling at posterior trunk just above band on bra  POSTURE: forward head, rounded shoulders  UPPER EXTREMITY AROM/PROM:  A/PROM RIGHT   eval  Right 11/18/23 Right  11/23/23 12/02/23 01/04/24 02/01/24  Shoulder extension 55    54   Shoulder flexion 133 142 145 145 150 141  Shoulder abduction 85 102 106 162 163 147  Shoulder internal rotation 40       Shoulder external rotation 60         (Blank rows = not tested)  A/PROM LEFT   eval  Shoulder extension 63  Shoulder flexion 175  Shoulder abduction 171  Shoulder internal rotation 64  Shoulder external rotation 95    (Blank rows = not tested)  MMT: 01/04/24: Rt UE Flex 4/5 and Abd 4-/5     Lt UE 5/5 for flex and abd  02/01/24: Rt UE flex 4+/5 and abd 4/5                 LYMPHEDEMA ASSESSMENTS:  SURGERY TYPE/DATE: R mastectomy and ALND in 2011 NUMBER OF LYMPH NODES REMOVED:  1/20 CHEMOTHERAPY: completed RADIATION:completed Jan 2012 HORMONE TREATMENT: anastrozole  from Nov 2016 to Dec 2019 INFECTIONS: none  LYMPHEDEMA ASSESSMENTS:   LANDMARK RIGHT  Eval on 03/16/22 10/25/23 11/11/23 11/16/23 11/23/23 12/16/23  10 cm proximal to olecranon process 47.1 50.7 47.2 47.4 47.1 46.4  Olecranon process 32.6 34.5 34 34.5 34.1 32.7  10 cm proximal to ulnar styloid process 23.6 26 26.4 26.5 25.4 25.3  Just proximal to ulnar styloid process 17.7 18 17.7 17.3 17.4 16.9  Across hand at thumb web space 21.4 22 20.2 20.2 20.3 19.2  At base of 2nd digit 6.1 6.2 5.8 6.1 5.8 5.9  (Blank rows = not tested)  LANDMARK LEFT  Eval on 03/16/22 10/25/23  10 cm proximal to olecranon process 43 46  Olecranon process 30 32.4  10 cm proximal to ulnar styloid process 24.4 26.1  Just proximal to ulnar styloid process 17.3 18.6  Across hand at thumb web space 20.6 22.1  At base of 2nd digit 6.4 6.4  (Blank rows = not tested)   Flowsheet Row Outpatient Rehab from 10/25/2023 in [facility]  Lymphedema Life Impact Scale Total Score 72.06 %      TODAY'S TREATMENT  02/01/24: Therapeutic Exercises Pulleys into flex and abd x 2 mins each focusing on Rt scapular depression throughout Roll yellow ball up wall into flex and abd x 15-20 each with 1# added to  Rt wrist, pt reports feeling challenged by this Therapeutic Activities Free Motion Machine 3# with arms on setting 4 for Rt UE add and then ext and then 7# for bil UE ext x 20 each with tactile cuing for reinforcement of correct technique  Prone with arm hanging off elevated mat table for following: T , Y, and ext x 12 each with demo of each and VC's during for technique, then trial of ext with arm on mat table by pt unable to lift UE off table without compensations Manual Therapy P/ROM to Rt shoulder into flex, abd and D2 with scapular depression by therapist throughout STM to scar tissue in Rt axilla A/ROM  measurements and MMT done for renewal   01/23/24: Therapeutic Exercises Pulleys into flex and abd x 2 mins each focusing on Rt scapular depression throughout Roll yellow ball up wall into flex and abd x 15-20 each Doorway Pectoralis Stretch (after Machine) x 3 reps, 20 sec holds Therapeutic Activities Free Motion Machine 7# with arms on setting 4 for Rt UE ext and then bil UE ext x 20, then Rt UE trial of 7# with tactile cuing by therapist on shoulder to help decrease Rt scap compensation, very challenging for pt and unable to stop compensation without cuing, then 3# x 10 reps, no compensations; PNF D1 with 3# and then 7# working to get postural muscles engaged, pt reports feeling this in lateral trunk today. Focused on controlling scapular rhythm and keeping core engaged throughout each. Wall Push Ups 2 x 10 Bil UE 3 way raises with back against wall/core engaged, and scapula and head against wall,  3# for flex and 2# for scaption and abd x 13 each except x 10 of abd returning therapist demo  Supine scap punch 3#, x 20 with tactile cues for correct muscle engagement 3# elbow extension for tricep strengthening which pt reports feels weak when she does her hair bc it's hard to hold her arm up for any period of time Manual Therapy P/ROM to Rt shoulder into flex, abd and D2 with scapular depression by therapist throughout STM to scar tissue in Rt axilla   01/04/24: Therapeutic Exercises Pulleys into flex and abd x 2 mins each focusing on Rt scapular depression throughout Roll yellow ball up wall into flex and abd x 15-20 each Prone Rt UE with 1#: Horz abd, scaption and flex and then ext x 10 each returning therapist, these were very challenging for pt and she had very limited A/ROM but was able to feel muscle engagement in scapular region without compensating; added to HEP Therapeutic Activities Free Motion Machine 7# with arms on setting 4 for Rt UE ext and then bil UE ext x 20, then with arm at  7 for R tUE ext and 3# (unable to do 7#) focusing on controlling scapular rhythm and keeping core engaged throughout each; then tried PNF D1 with 3# and then 7# working to get postural muscles engaged but pt reports only feeling this in shoulder so stopped Wall Push Ups x 10 Bil UE 3 way raises with back against wall/core engaged, and scapula and head against wall,  2#x 10 each flex, scaption, and abd x 10 each returning therapist demo   12/28/23: Therapeutic Exercises Pulleys into flex and abd x 2 mins each focusing on Rt scapular depression throughout Roll yellow ball up wall into flex and abd x 15-20 each Therapeutic Activities Bil UE 3 way raises with back against wall/core engaged, and scapula and  head against wall,  2# x 15 for flex and scaption, and then abd x 10 each returning therapist demo  Free Motion Machine 7# with arms on setting 4 for bil UE ext 2 x 15 focusing on controlling scapular rhythm and keeping core engaged Wall Push Ups x 10 Manual Therapy P/ROM to Rt shoulder into flex, abd and D2 with scapular depression by therapist throughout STM to scar tissue in Rt axilla, then to Rt UT and medial scap border in Lt S/L Scap Mobs in Lt S/L into protraction and retraction      PATIENT EDUCATION:  Education details: Prone Rt shoulder strengthening Gregson educated: Patient Education method: Programmer, multimedia, demonstration, VC's and handout issued Education comprehension: verbalized understanding, returned demonstration, will benefit from further review  HOME EXERCISE PROGRAM: 12/26/23 - Bil UE 3 way raises 01/04/24 - Access Code [facility] for prone Rt shoulder strengthening  ASSESSMENT:  CLINICAL IMPRESSION: Pt is reporting starting to notice some improvement with muscle contraction with prone strengthening exercises. She has been working on compliance with being more consistent with incorporating her end ROM stretches into her day. Re measured her A/ROM and though her motion is  less, her technique is much improved now with very minimal compensations now, where before she was lifting arm higher but with scapular compensations. Pt is reporting overall feeling stronger and her MMT do show this improvement. She would like to cont 1x/wk x 4 weeks to cont working on improving her UE and postural strength. Pt reports her prescribing doctor would like her to cont at this time as well as she is seeing improvements also. Pt will benefit from continued physical therapy at this time to cont progressing towards her unmet goals of improving her Rt shoulder strength and A/ROM.   OBJECTIVE IMPAIRMENTS decreased ROM and increased edema.   ACTIVITY LIMITATIONS carrying, lifting, and reach over head  PARTICIPATION LIMITATIONS: none  PERSONAL FACTORS Time since onset of injury/illness/exacerbation are also affecting patient's functional outcome.   REHAB POTENTIAL: Good  CLINICAL DECISION MAKING: Stable/uncomplicated  EVALUATION COMPLEXITY: Low  GOALS: Goals reviewed with patient? Yes  SHORT TERM GOALS: Target date: 11/15/23   Pt will demonstrate a 2 cm reduction in edema at 10 cm proximal to olecranon process.  Baseline: 50.7 cm; 11/23/23 - 47.1 (3.6 cm reduction) Goal status: MET  2.  Pt will demonstrate 110 degrees of R shoulder abduction to allow pt to reach out to the side.  Baseline: 85; 11/23/23 - 106 Goal status: MET  3.  Pt will obtain a compression bra for long term management of truncal edema especially posterior..  Baseline:  Goal status: MET  4.  Pt will demonstrate 150 degrees of R shoulder flexion to allow her to reach overhead.  Baseline: 133; 11/23/23 - 145 degrees, 12/05/23 - 145 deg; 01/04/24 - 150 deg  Goal status: MET   LONG TERM GOALS: Target date: 02/01/24    Pt will demonstrate a 3 cm reduction in edema at 10 cm proximal to olecranon process.  Baseline: 50.7 cm; 11/23/23 - 47.1 cm (3.6 cm reduction) Goal status: MET  2.  Pt will demonstrate 165  degrees of R shoulder abduction to allow pt to reach out to the side.  Baseline: 85 degrees; 11/23/23 - 106 degrees, 12/05/23 - 165; 01/04/24 - 163 degrees with compensation (pt struggles with limiting this due to weakness); 147 degrees but pt able to do today without scapula compensation so progress noted Goal status: ONGOING  3.  Pt will obtain  appropriate day time and night time garments for long term management of R UE lymphedema.  Baseline: Needs new garments; 11/23/23 - pt was just measured today for new day and nighttime garments; 12/26/23 - pt now has new garments Goal status: MET  4.  Pt will be able to independently manage her lymphedema through self MLD and compression garments.  Baseline: Pt is independent with self MLD and has been measured for compression garments; 01/04/24 - pt now has her new compression garments Goal status: MET  5.  Pt will demonstrate 165 degrees of R shoulder flexion to allow her to reach overhead.  Baseline: 133 degrees; 11/23/23 - 145 degrees; 141 degrees with no scapular compensation Goal status: ONGOING  6.  Pt will demonstrate improved Rt shoulder strength to 4+/5 to equal Lt for improved ease with washing and combing her hair.  Baseline: Rt shoulder MMT flex 4/5 and abd 4-/5; Rt shoulder flex 4+/5 and abd 4/5  Goal Status: PARTIALLY MET   PLAN: PT FREQUENCY: 1x per week   PT DURATION: 4 weeks   PLANNED INTERVENTIONS: Therapeutic exercises, Therapeutic activity, Patient/Family education, Self Care, Joint mobilization, Orthotic/Fit training, Manual lymph drainage, Compression bandaging, Vasopneumatic device, Manual therapy, and Re-evaluation  PLAN FOR NEXT SESSION: Renewal done today to cont 1x/wk, x 4 weeks. Cont with Rt shoulder strength focusing on postural and scapular strength; review new HEP and progress as able and encourage consistency with pt   Aden Berwyn Caldron, PTA 02/01/2024, 3:34 PM    3 Way Raises:      Starting Position:   Leaning against wall, walk feet a few inches away from the wall and make tummy tight (tuck hips underneath you) Press back/shoulders/head against wall as much as possible. Keep thumbs up to ceiling, elbows straight and shoulders relaxed/down throughout.  1. Lift arms in front to shoulder height 2. Lift arms a little wider into a V to shoulder height 3. Lift arms out to sides in a T to shoulder height  Perform 10 times in each direction. Hold 1-2 lbs to start with and work up to 2-3 sets of 10/day. Perform 3-4 times/week. Increase weight as able, decreasing sets of 10 each time you increase weights, then slowly working your way back up to 2-3 sets each time.    Cancer Rehab [phone]

## 2024-02-03 NOTE — Addendum Note (Signed)
 Addended by: LARUE SADDIE SAUNDERS on: 02/03/2024 12:52 PM   Modules accepted: Orders

## 2024-02-08 ENCOUNTER — Ambulatory Visit: Attending: Family Medicine

## 2024-02-08 DIAGNOSIS — M25611 Stiffness of right shoulder, not elsewhere classified: Secondary | ICD-10-CM | POA: Diagnosis not present

## 2024-02-08 DIAGNOSIS — Z853 Personal history of malignant neoplasm of breast: Secondary | ICD-10-CM | POA: Diagnosis not present

## 2024-02-08 DIAGNOSIS — I972 Postmastectomy lymphedema syndrome: Secondary | ICD-10-CM | POA: Diagnosis not present

## 2024-02-08 NOTE — Therapy (Signed)
 OUTPATIENT PHYSICAL THERAPY ONCOLOGY TREATMENT     Patient Name: Karen Hopkins MRN: [id number] DOB:[birth year], 48 y.o., female Today's Date: 02/08/2024   PT End of Session - 02/08/24 1403     Visit Number 26    Number of Visits 35    Date for PT Re-Evaluation 02/29/24    PT Start Time 1401    PT Stop Time 1503    PT Time Calculation (min) 62 min    Activity Tolerance Patient tolerated treatment well    Behavior During Therapy [facility] for tasks assessed/performed              Past Medical History:  Diagnosis Date   Anxiety    Arthritis    Cancer (HCC) 2011   Rt. Br. Ca   Depression    Diabetes mellitus without complication (HCC)    has been borderline   Essential hypertension 11/05/2015   H. pylori infection    History of radiation therapy    Hypertension    under control with med., has been on med. x 2 yr.   Lymphedema of arm    right; no BP or puncture to right arm   Personal history of chemotherapy 09/16/2009   rt breast   Pneumonia    Seasonal allergies    Sinus headache    Past Surgical History:  Procedure Laterality Date   BREAST BIOPSY Right 08/26/2009   BREAST REDUCTION SURGERY Left 08/23/2013   Procedure: LEFT BREAST REDUCTION  ;  Surgeon: Estefana Reichert, DO;  Location: [facility];  Service: Plastics;  Laterality: Left;   BREAST REDUCTION WITH MASTOPEXY Left 08/22/2019   Procedure: Left breast mastopexy reduction with liposuction for breast symmetry;  Surgeon: Lowery Estefana RAMAN, DO;  Location: [facility];  Service: Plastics;  Laterality: Left;   CESAREAN SECTION  07/15/2001; 03/18/2004; 11/21/2008   lateral orbiotomy Left 11/2015   Dr. Melba at [facility]   LATISSIMUS FLAP TO BREAST Right 03/12/2013   Procedure: RIGHT BREAST LATISSIMUS FLAP WITH EXPANDER PLACEMENT;  Surgeon: Estefana Reichert, DO;  Location: [facility];  Service: Plastics;  Laterality: Right;   LIPOSUCTION Bilateral 08/23/2013   Procedure: LIPOSUCTION;  Surgeon:  Estefana Reichert, DO;  Location: [facility];  Service: Plastics;  Laterality: Bilateral;   LIPOSUCTION WITH LIPOFILLING Left 08/22/2019   Procedure: LIPOSUCTION WITH LIPOFILLING;  Surgeon: Lowery Estefana RAMAN, DO;  Location: [facility];  Service: Plastics;  Laterality: Left;   MASTECTOMY Right 2011   MODIFIED RADICAL MASTECTOMY Right 03/11/2010   NASAL SEPTUM SURGERY     ORIF ANKLE FRACTURE Left 07/18/2018   Procedure: OPEN REDUCTION INTERNAL FIXATION (ORIF) LEFT ANKLE FRACTURE WITH  SYNDESMOSIS AND ANKLE ARTHROTOMY;  Surgeon: Elsa Lonni SAUNDERS, MD;  Location: [facility];  Service: Orthopedics;  Laterality: Left;   PORT-A-CATH REMOVAL Left 12/01/2010   PORTACATH PLACEMENT Left 09/12/2009   REDUCTION MAMMAPLASTY Left 08/2013   REMOVAL OF TISSUE EXPANDER AND PLACEMENT OF IMPLANT Right 08/23/2013   Procedure: REMOVAL RIGHT TISSUE EXPANDER AND PLACEMENT OF IMPLANT TO RIGHT BREAST ;  Surgeon: Estefana Reichert, DO;  Location: [facility];  Service: Plastics;  Laterality: Right;   SUPRA-UMBILICAL HERNIA  2006   TUBAL LIGATION  11/21/2008   UMBILICAL HERNIA REPAIR  04/12/2022   CCS DR   Patient Active Problem List   Diagnosis Date Noted   Menorrhagia 01/19/2024   Contraceptive, surveillance, intrauterine device 04/13/2023   Nephrolithiasis 10/21/2022   H. pylori infection 09/25/2022  Abdominal hernia 09/25/2022   Seasonal allergies 09/25/2022   Carpal tunnel syndrome 10/07/2021   Genetic testing 08/14/2019   Breast asymmetry following reconstructive surgery 07/13/2019   Family history of breast cancer    Family history of prostate cancer    Unintentional weight loss 05/21/2019   Lymphedema of right arm 02/27/2018   Dyspnea 09/23/2016   Eczema 08/31/2016   Essential hypertension 11/05/2015   Cervical dystonia 08/25/2015   Headache 07/09/2015   Myofascial muscle pain 04/29/2015   Adhesive capsulitis of right shoulder 04/29/2015    Lower extremity edema 10/01/2014   Malignant neoplasm of overlapping sites of right breast in female, estrogen receptor positive (HCC) 05/24/2014   Myalgia and myositis 05/17/2014   Neoplasm related pain 03/12/2014   Constipation 11/26/2013   H/O reduction mammoplasty 10/30/2013   Seizure (HCC) 10/06/2013   Status post bilateral breast implants 08/31/2013   S/P breast reconstruction, right 08/23/2013   AC joint arthropathy 07/02/2013   Routine adult health maintenance 05/29/2013   Hypokalemia 05/21/2013   Chronic pain 04/19/2013   Acquired absence of breast and absent nipple 01/30/2013   RHINOSINUSITIS, CHRONIC 04/08/2010   Morbid obesity (HCC) 08/04/2006   DEPRESSIVE DISORDER, NOS 08/04/2006    PCP: Laymon Legions, MD  REFERRING PROVIDER: Legions Laymon PARAS, MD   REFERRING DIAG: I89.0 (ICD-10-CM) - Lymphedema of right arm   THERAPY DIAG:  Postmastectomy lymphedema  Stiffness of right shoulder, not elsewhere classified  History of right breast cancer  ONSET DATE: 2011  Rationale for Evaluation and Treatment Rehabilitation  SUBJECTIVE                                                                                                                                                                                           SUBJECTIVE STATEMENT: My Rt arm feels pretty good today. It's not hurting as bad. I still have to really work to keep my shoulder down but I am able to do a little better with it now, especially when I do the exercises.   PERTINENT HISTORY:  Right breast cancer with mastectomy 2011 and ALND 22 nodes; reconstruction; left breast reduction; h/o right UE lymphedema and right upper quadrant pain, treated in this clinic several times before.  PAIN:  Are you having pain? No  PRECAUTIONS: Other: R UE lymphedema, has metal in ankle  RED FLAGS: none  WEIGHT BEARING RESTRICTIONS No  FALLS:  Has patient fallen in last 6 months? No  LIVING  ENVIRONMENT: Lives with: lives with 3 sons Lives in: House/apartment Stairs: No;  Has following equipment at home: None  OCCUPATION: on disability  LEISURE: trying to exercise by  walking but has been bothered by her L ankle pain  HAND DOMINANCE : right   PRIOR LEVEL OF FUNCTION: Independent  PATIENT GOALS to get my arm down and try to maintain it   OBJECTIVE  COGNITION:  Overall cognitive status: Within functional limits for tasks assessed    OBSERVATIONS / OTHER ASSESSMENTS: R upper arm considerable larger than left, area of swelling at posterior trunk just above band on bra  POSTURE: forward head, rounded shoulders  UPPER EXTREMITY AROM/PROM:  A/PROM RIGHT   eval  Right 11/18/23 Right  11/23/23 12/02/23 01/04/24 02/01/24 02/08/24  Shoulder extension 55    54    Shoulder flexion 133 142 145 145 150 141 156  Shoulder abduction 85 102 106 162 163 147 162  Shoulder internal rotation 40        Shoulder external rotation 60          (Blank rows = not tested)  A/PROM LEFT   eval  Shoulder extension 63  Shoulder flexion 175  Shoulder abduction 171  Shoulder internal rotation 64  Shoulder external rotation 95    (Blank rows = not tested)  MMT: 01/04/24: Rt UE Flex 4/5 and Abd 4-/5     Lt UE 5/5 for flex and abd  02/01/24: Rt UE flex 4+/5 and abd 4/5                 LYMPHEDEMA ASSESSMENTS:  SURGERY TYPE/DATE: R mastectomy and ALND in 2011 NUMBER OF LYMPH NODES REMOVED: 1/20 CHEMOTHERAPY: completed RADIATION:completed Jan 2012 HORMONE TREATMENT: anastrozole  from Nov 2016 to Dec 2019 INFECTIONS: none  LYMPHEDEMA ASSESSMENTS:   LANDMARK RIGHT  Eval on 03/16/22 10/25/23 11/11/23 11/16/23 11/23/23 12/16/23  10 cm proximal to olecranon process 47.1 50.7 47.2 47.4 47.1 46.4  Olecranon process 32.6 34.5 34 34.5 34.1 32.7  10 cm proximal to ulnar styloid process 23.6 26 26.4 26.5 25.4 25.3  Just proximal to ulnar styloid process 17.7 18 17.7 17.3 17.4 16.9  Across hand at  thumb web space 21.4 22 20.2 20.2 20.3 19.2  At base of 2nd digit 6.1 6.2 5.8 6.1 5.8 5.9  (Blank rows = not tested)  LANDMARK LEFT  Eval on 03/16/22 10/25/23  10 cm proximal to olecranon process 43 46  Olecranon process 30 32.4  10 cm proximal to ulnar styloid process 24.4 26.1  Just proximal to ulnar styloid process 17.3 18.6  Across hand at thumb web space 20.6 22.1  At base of 2nd digit 6.4 6.4  (Blank rows = not tested)   Flowsheet Row Outpatient Rehab from 10/25/2023 in [facility]  Lymphedema Life Impact Scale Total Score 72.06 %      TODAY'S TREATMENT  02/08/24: Therapeutic Exercises Pulleys into flex and abd x 2 mins each focusing on Rt scapular depression throughout, pt with improved technique with this today Roll yellow ball up wall into flex and abd x 15-20 each with 1# added to Rt wrist; pt reports conts to be challenged by wrist weight while trying to keep scapula depressed Therapeutic Activities Forearms on wall with yellow theraband for following: Wall walks x 5 up/down, then scap retract 2 x 5 reps with rest break between sets due to muscle fatigue; pt reports these were challenging and she could feel good muscle contraction at lateral trunk; then serratus forearm push ups x 10 returning therapist demo Free Motion Machine: setting 4 for Rt UE add 7# x 7 reps with tactile cuing to decrease  UT engagement and scap engagement, then 3# x 12 reps with little compensation, and then ext 7# for bil UE ext x 20 each with improved technique Manual Therapy STM to Rt lateral trunk with trial of dynamic cupping using cocoa butter; focused on area of inferior to lymphedema at lateral trunk to chest wall Kinesiotape (after washing cocoa butter off skin with soap/water) to Rt lateral trunk in I band fashion beginning inferior to edema at lateral trunk and pulling tape up to cover pocket of swelling.   02/01/24: Therapeutic Exercisesin Lt S/L to Lt lateral  trunk  Pulleys into flex and abd x 2 mins each focusing on Rt scapular depression throughout Roll yellow ball up wall into flex and abd x 15-20 each with 1# added to Rt wrist, pt reports feeling challenged by this Therapeutic Activities Free Motion Machine 3# with arms on setting 4 for Rt UE add and then ext and then 7# for bil UE ext x 20 each with tactile cuing for reinforcement of correct technique  Prone with arm hanging off elevated mat table for following: T , Y, and ext x 12 each with demo of each and VC's during for technique, then trial of ext with arm on mat table by pt unable to lift UE off table without compensations Manual Therapy P/ROM to Rt shoulder into flex, abd and D2 with scapular depression by therapist throughout STM to scar tissue in Rt axilla A/ROM measurements and MMT done for renewal   01/23/24: Therapeutic Exercises Pulleys into flex and abd x 2 mins each focusing on Rt scapular depression throughout Roll yellow ball up wall into flex and abd x 15-20 each Doorway Pectoralis Stretch (after Machine) x 3 reps, 20 sec holds Therapeutic Activities Free Motion Machine 7# with arms on setting 4 for Rt UE ext and then bil UE ext x 20, then Rt UE trial of 7# with tactile cuing by therapist on shoulder to help decrease Rt scap compensation, very challenging for pt and unable to stop compensation without cuing, then 3# x 10 reps, no compensations; PNF D1 with 3# and then 7# working to get postural muscles engaged, pt reports feeling this in lateral trunk today. Focused on controlling scapular rhythm and keeping core engaged throughout each. Wall Push Ups 2 x 10 Bil UE 3 way raises with back against wall/core engaged, and scapula and head against wall,  3# for flex and 2# for scaption and abd x 13 each except x 10 of abd returning therapist demo  Supine scap punch 3#, x 20 with tactile cues for correct muscle engagement 3# elbow extension for tricep strengthening which pt  reports feels weak when she does her hair bc it's hard to hold her arm up for any period of time Manual Therapy P/ROM to Rt shoulder into flex, abd and D2 with scapular depression by therapist throughout STM to scar tissue in Rt axilla   01/04/24: Therapeutic Exercises Pulleys into flex and abd x 2 mins each focusing on Rt scapular depression throughout Roll yellow ball up wall into flex and abd x 15-20 each Prone Rt UE with 1#: Horz abd, scaption and flex and then ext x 10 each returning therapist, these were very challenging for pt and she had very limited A/ROM but was able to feel muscle engagement in scapular region without compensating; added to HEP Therapeutic Activities Free Motion Machine 7# with arms on setting 4 for Rt UE ext and then bil UE ext x 20, then with  arm at 7 for R tUE ext and 3# (unable to do 7#) focusing on controlling scapular rhythm and keeping core engaged throughout each; then tried PNF D1 with 3# and then 7# working to get postural muscles engaged but pt reports only feeling this in shoulder so stopped Wall Push Ups x 10 Bil UE 3 way raises with back against wall/core engaged, and scapula and head against wall,  2#x 10 each flex, scaption, and abd x 10 each returning therapist demo       PATIENT EDUCATION:  Education details: Scapular stability with forearms on wall [place] educated: Patient Education method: Explanation, demonstration, VC's and handout issued (emailed [employer] link) Education comprehension: verbalized understanding, returned demonstration, will benefit from further review  HOME EXERCISE PROGRAM: 12/26/23 - Bil UE 3 way raises 01/04/24 - Access Code [email or web address] for prone Rt shoulder strengthening 02/08/24 - Access Code: [email or web address] URL: [email or web address] Date: 02/08/2024 Prepared by: Berwyn Knights  Exercises - Forearm Walks on Wall with Resistance Band  - 1 x daily - 7 x weekly - 2-3 sets - 5-7 reps - Serratus Forearm Push  Up Plus at Wall with Elbows  - 1 x daily - 7 x weekly - 2-3 sets - 5-7 reps  ASSESSMENT:  CLINICAL IMPRESSION: Continued to progress pt with working on exercises that she is able to perform with correct scapular muscle engagement without compensations which is a challenge due to hx of lat flap. She was able to return excellent demo of forearms on wall exs and reports feeling good muscle contraction at Rt lateral trunk with little to no compensations so added these to her HEP. Then with manual therapy focused on area inferior to pocket of swelling inferior to Rt axilla that is probable to being lymphedema. Discussed this with pt and encouraged her to focus self MLD to Rt axillo-inguinal anastomosis daily as able. Also included trial of kinesiotape today here as well therapist noted that pt has line of irritation where her regular bras sit on her skin and rub daily. So encouraged her resuming daily wear of compression bras to see if they will better support the tissue here and alleviate pressure to newly created skin fold. Pt verbalized good understanding of all and is pleased with new exercises as they are helping her feel more in control on her compensations which is something she has struggled with since her reconstruction many years ago.    OBJECTIVE IMPAIRMENTS decreased ROM and increased edema.   ACTIVITY LIMITATIONS carrying, lifting, and reach over head  PARTICIPATION LIMITATIONS: none  PERSONAL FACTORS Time since onset of injury/illness/exacerbation are also affecting patient's functional outcome.   REHAB POTENTIAL: Good  CLINICAL DECISION MAKING: Stable/uncomplicated  EVALUATION COMPLEXITY: Low  GOALS: Goals reviewed with patient? Yes  SHORT TERM GOALS: Target date: 11/15/23   Pt will demonstrate a 2 cm reduction in edema at 10 cm proximal to olecranon process.  Baseline: 50.7 cm; 11/23/23 - 47.1 (3.6 cm reduction) Goal status: MET  2.  Pt will demonstrate 110 degrees of R  shoulder abduction to allow pt to reach out to the side.  Baseline: 85; 11/23/23 - 106 Goal status: MET  3.  Pt will obtain a compression bra for long term management of truncal edema especially posterior..  Baseline:  Goal status: MET  4.  Pt will demonstrate 150 degrees of R shoulder flexion to allow her to reach overhead.  Baseline: 133; 11/23/23 - 145 degrees, 12/05/23 - 145 deg; 01/04/24 - 150 deg  Goal status:  MET   LONG TERM GOALS: Target date: 02/01/24    Pt will demonstrate a 3 cm reduction in edema at 10 cm proximal to olecranon process.  Baseline: 50.7 cm; 11/23/23 - 47.1 cm (3.6 cm reduction) Goal status: MET  2.  Pt will demonstrate 165 degrees of R shoulder abduction to allow pt to reach out to the side.  Baseline: 85 degrees; 11/23/23 - 106 degrees, 12/05/23 - 165; 01/04/24 - 163 degrees with compensation (pt struggles with limiting this due to weakness); 147 degrees but pt able to do today without scapula compensation so progress noted; 02/08/24 - 162 degrees Goal status: ONGOING  3.  Pt will obtain appropriate day time and night time garments for long term management of R UE lymphedema.  Baseline: Needs new garments; 11/23/23 - pt was just measured today for new day and nighttime garments; 12/26/23 - pt now has new garments Goal status: MET  4.  Pt will be able to independently manage her lymphedema through self MLD and compression garments.  Baseline: Pt is independent with self MLD and has been measured for compression garments; 01/04/24 - pt now has her new compression garments Goal status: MET  5.  Pt will demonstrate 165 degrees of R shoulder flexion to allow her to reach overhead.  Baseline: 133 degrees; 11/23/23 - 145 degrees; 141 degrees with no scapular compensation; 02/08/24 - 156 degrees Goal status: ONGOING  6.  Pt will demonstrate improved Rt shoulder strength to 4+/5 to equal Lt for improved ease with washing and combing her hair.  Baseline: Rt shoulder MMT flex 4/5  and abd 4-/5; Rt shoulder flex 4+/5 and abd 4/5  Goal Status: PARTIALLY MET   PLAN: PT FREQUENCY: 1x per week   PT DURATION: 4 weeks   PLANNED INTERVENTIONS: Therapeutic exercises, Therapeutic activity, Patient/Family education, Self Care, Joint mobilization, Orthotic/Fit training, Manual lymph drainage, Compression bandaging, Vasopneumatic device, Manual therapy, and Re-evaluation  PLAN FOR NEXT SESSION: How was kinesiotape? Cont this? How are new exs? Cont with Rt shoulder strength focusing on postural and scapular strength; cont progressing pt with activities that will challenge scapular stability. Did she try wearing her compression bras more for lateral trunk swelling? Compression jovi pak vest?    Aden Berwyn Caldron, PTA 02/08/2024, 5:23 PM    3 Way Raises:      Starting Position:  Leaning against wall, walk feet a few inches away from the wall and make tummy tight (tuck hips underneath you) Press back/shoulders/head against wall as much as possible. Keep thumbs up to ceiling, elbows straight and shoulders relaxed/down throughout.  1. Lift arms in front to shoulder height 2. Lift arms a little wider into a V to shoulder height 3. Lift arms out to sides in a T to shoulder height  Perform 10 times in each direction. Hold 1-2 lbs to start with and work up to 2-3 sets of 10/day. Perform 3-4 times/week. Increase weight as able, decreasing sets of 10 each time you increase weights, then slowly working your way back up to 2-3 sets each time.    Cancer Rehab [phone]

## 2024-02-13 ENCOUNTER — Ambulatory Visit

## 2024-02-16 ENCOUNTER — Ambulatory Visit: Payer: 59

## 2024-02-16 VITALS — Ht 64.0 in | Wt 255.0 lb

## 2024-02-16 DIAGNOSIS — Z0000 Encounter for general adult medical examination without abnormal findings: Secondary | ICD-10-CM

## 2024-02-16 NOTE — Progress Notes (Signed)
 Because this visit was a virtual/telehealth visit,  certain criteria was not obtained, such a blood pressure, CBG if applicable, and timed get up and go. Any medications not marked as taking were not mentioned during the medication reconciliation part of the visit. Any vitals not documented were not able to be obtained due to this being a telehealth visit or patient was unable to self-report a recent blood pressure reading due to a lack of equipment at home via telehealth. Vitals that have been documented are verbally provided by the patient.   Subjective:   Karen Hopkins is a 48 y.o. who presents for a Medicare Wellness preventive visit.  As a reminder, Annual Wellness Visits don't include a physical exam, and some assessments may be limited, especially if this visit is performed virtually. We may recommend an in-[place] follow-up visit with your provider if needed.  Visit Complete: Virtual I connected with  [name] on 02/16/24 by a audio enabled telemedicine application and verified that I am speaking with the correct Brewbaker using two identifiers.  Patient Location: Home  Provider Location: Office/Clinic  I discussed the limitations of evaluation and management by telemedicine. The patient expressed understanding and agreed to proceed.  Vital Signs: Because this visit was a virtual/telehealth visit, some criteria may be missing or patient reported. Any vitals not documented were not able to be obtained and vitals that have been documented are patient reported.  VideoDeclined- This patient declined Librarian, academic. Therefore the visit was completed with audio only.  Persons Participating in Visit: Patient.  AWV Questionnaire: Yes: Patient Medicare AWV questionnaire was completed by the patient on 02/15/2024; I have confirmed that all information answered by patient is correct and no changes since this date.        Objective:    Today's Vitals    02/16/24 1456  Weight: 255 lb (115.7 kg)  Height: 5' 4 (1.626 m)  PainSc: 0-No pain   Body mass index is 43.77 kg/m.     02/16/2024    2:59 PM 01/19/2024    9:50 AM 12/02/2023   11:06 AM 10/25/2023    2:06 PM 10/24/2023   10:16 AM 02/16/2023    3:16 PM 10/21/2022    9:23 AM  Advanced Directives  Does Patient Have a Medical Advance Directive? No No No No No No No  Would patient like information on creating a medical advance directive? No - Patient declined No - Patient declined No - Patient declined No - Patient declined No - Patient declined Yes (MAU/Ambulatory/Procedural Areas - Information given) No - Patient declined    Current Medications (verified) Outpatient Encounter Medications as of 02/16/2024  Medication Sig   cyclobenzaprine  (FLEXERIL ) 10 MG tablet Take 10 mg by mouth 2 (two) times daily as needed.   fexofenadine  (ALLEGRA ) 180 MG tablet Take 1 tablet (180 mg total) by mouth daily.   fluticasone  (FLONASE ) 50 MCG/ACT nasal spray SHAKE LIQUID AND USE 2 SPRAYS IN EACH NOSTRIL DAILY   gabapentin  (NEURONTIN ) 100 MG capsule Take 200 mg by mouth 2 (two) times daily.   HYDROcodone -acetaminophen  (NORCO/VICODIN) 5-325 MG tablet Take 1 tablet by mouth every 8 (eight) hours as needed for severe pain.   levonorgestrel  (MIRENA ) 20 MCG/24HR IUD 1 each by Intrauterine route once.   omeprazole  (PRILOSEC) 40 MG capsule TAKE 1 CAPSULE(40 MG) BY MOUTH DAILY   Semaglutide ,0.25 or 0.5MG /DOS, (OZEMPIC , 0.25 OR 0.5 MG/DOSE,) 2 MG/1.5ML SOPN Inject 0.5 mg into the skin once a week.  No facility-administered encounter medications on file as of 02/16/2024.    Allergies (verified) Penicillins, Lyrica  [pregabalin ], Meloxicam , and Robaxin  [methocarbamol ]   History: Past Medical History:  Diagnosis Date   Anxiety    Arthritis    Cancer (HCC) 2011   Rt. Br. Ca   Depression    Diabetes mellitus without complication (HCC)    has been borderline   Essential hypertension 11/05/2015   H. pylori  infection    History of radiation therapy    Hypertension    under control with med., has been on med. x 2 yr.   Lymphedema of arm    right; no BP or puncture to right arm   Personal history of chemotherapy 09/16/2009   rt breast   Pneumonia    Seasonal allergies    Sinus headache    Past Surgical History:  Procedure Laterality Date   BREAST BIOPSY Right 08/26/2009   BREAST REDUCTION SURGERY Left 08/23/2013   Procedure: LEFT BREAST REDUCTION  ;  Surgeon: Estefana Reichert, DO;  Location: [facility];  Service: Plastics;  Laterality: Left;   BREAST REDUCTION WITH MASTOPEXY Left 08/22/2019   Procedure: Left breast mastopexy reduction with liposuction for breast symmetry;  Surgeon: Lowery Estefana RAMAN, DO;  Location: [facility];  Service: Plastics;  Laterality: Left;   CESAREAN SECTION  07/15/2001; 03/18/2004; 11/21/2008   lateral orbiotomy Left 11/2015   Dr. Melba at [facility]   LATISSIMUS FLAP TO BREAST Right 03/12/2013   Procedure: RIGHT BREAST LATISSIMUS FLAP WITH EXPANDER PLACEMENT;  Surgeon: Estefana Reichert, DO;  Location: [facility];  Service: Plastics;  Laterality: Right;   LIPOSUCTION Bilateral 08/23/2013   Procedure: LIPOSUCTION;  Surgeon: Estefana Reichert, DO;  Location: [facility];  Service: Plastics;  Laterality: Bilateral;   LIPOSUCTION WITH LIPOFILLING Left 08/22/2019   Procedure: LIPOSUCTION WITH LIPOFILLING;  Surgeon: Lowery Estefana RAMAN, DO;  Location: [facility];  Service: Plastics;  Laterality: Left;   MASTECTOMY Right 2011   MODIFIED RADICAL MASTECTOMY Right 03/11/2010   NASAL SEPTUM SURGERY     ORIF ANKLE FRACTURE Left 07/18/2018   Procedure: OPEN REDUCTION INTERNAL FIXATION (ORIF) LEFT ANKLE FRACTURE WITH  SYNDESMOSIS AND ANKLE ARTHROTOMY;  Surgeon: Elsa Lonni SAUNDERS, MD;  Location: [facility];  Service: Orthopedics;  Laterality: Left;   PORT-A-CATH REMOVAL Left 12/01/2010   PORTACATH PLACEMENT Left  09/12/2009   REDUCTION MAMMAPLASTY Left 08/2013   REMOVAL OF TISSUE EXPANDER AND PLACEMENT OF IMPLANT Right 08/23/2013   Procedure: REMOVAL RIGHT TISSUE EXPANDER AND PLACEMENT OF IMPLANT TO RIGHT BREAST ;  Surgeon: Estefana Reichert, DO;  Location: [facility];  Service: Plastics;  Laterality: Right;   SUPRA-UMBILICAL HERNIA  2006   TUBAL LIGATION  11/21/2008   UMBILICAL HERNIA REPAIR  04/12/2022   [facility] DR   Family History  Problem Relation Age of Onset   Hypertension Mother    Breast cancer Mother 53   Diabetes type II Father    Prostate cancer Father 63   Stroke Neg Hx    Colon polyps Neg Hx    Esophageal cancer Neg Hx    Pancreatic cancer Neg Hx    Stomach cancer Neg Hx    Rectal cancer Neg Hx    Social History   Socioeconomic History   Marital status: Single    Spouse name: Not on file   Number of children: 3   Years of education: 16   Highest education level: Bachelor's degree (e.g.,  BA, AB, BS)  Occupational History   Occupation: Disability   Tobacco Use   Smoking status: Former    Current packs/day: 0.00    Types: Cigarettes    Start date: 06/07/2002    Quit date: 05/08/2019    Years since quitting: 4.7   Smokeless tobacco: Never  Vaping Use   Vaping status: Never Used  Substance and Sexual Activity   Alcohol  use: No   Drug use: No   Sexual activity: Yes    Partners: Male    Birth control/protection: I.U.D., Surgical    Comment: Mirena  IUD inserted 06/04/19  Other Topics Concern   Not on file  Social History Narrative   Lives with kids   Caffeine use: none    Social Drivers of Corporate investment banker Strain: Low Risk  (02/15/2024)   Overall Financial Resource Strain (CARDIA)    Difficulty of Paying Living Expenses: Not very hard  Food Insecurity: No Food Insecurity (02/15/2024)   Hunger Vital Sign    Worried About Running Out of Food in the Last Year: Never true    Ran Out of Food in the Last Year: Never true  Transportation Needs: No  Transportation Needs (02/15/2024)   PRAPARE - Administrator, Civil Service (Medical): No    Lack of Transportation (Non-Medical): No  Physical Activity: Insufficiently Active (02/15/2024)   Exercise Vital Sign    Days of Exercise per Week: 2 days    Minutes of Exercise per Session: 40 min  Stress: No Stress Concern Present (02/15/2024)   [employer] of Occupational Health - Occupational Stress Questionnaire    Feeling of Stress: Only a little  Social Connections: Moderately Integrated (02/15/2024)   Social Connection and Isolation Panel    Frequency of Communication with Friends and Family: Twice a week    Frequency of Social Gatherings with Friends and Family: Once a week    Attends Religious Services: More than 4 times per year    Active Member of [employer] or Organizations: Yes    Attends Banker Meetings: 1 to 4 times per year    Marital Status: Never married    Tobacco Counseling Counseling given: Not Answered    Clinical Intake:  Pre-visit preparation completed: Yes  Pain : No/denies pain Pain Score: 0-No pain     BMI - recorded: 43.77 Nutritional Status: BMI > 30  Obese Nutritional Risks: None Diabetes: No  Lab Results  Component Value Date   HGBA1C 6.0 (A) 10/24/2023   HGBA1C 5.7 (A) 10/01/2021   HGBA1C 5.1 05/15/2019     How often do you need to have someone help you when you read instructions, pamphlets, or other written materials from your doctor or pharmacy?: 1 - Never  Interpreter Needed?: No  Information entered by :: Dereon Williamsen N. Jaeley Wiker, LPN.   Activities of Daily Living     02/16/2024    2:59 PM 02/15/2024    5:10 PM  In your present state of health, do you have any difficulty performing the following activities:  Hearing? 0 0  Vision? 0 0  Difficulty concentrating or making decisions? 0 0  Walking or climbing stairs? 1 1  Dressing or bathing? 0 0  Doing errands, shopping? 0 0  Preparing Food and eating ? N N   Using the Toilet? N N  In the past six months, have you accidently leaked urine? N N  Do you have problems with loss of bowel control? N N  Managing your Medications? N N  Managing your Finances? N N  Housekeeping or managing your Housekeeping? N N    Patient Care Team: Donah Laymon PARAS, MD as PCP - General (Pediatrics) Ines Onetha NOVAK, MD as Consulting Physician (Neurology) Carilyn Prentice BRAVO, MD as Consulting Physician (Physical Medicine and Rehabilitation) Dillingham, Estefana RAMAN, DO as Attending Physician (Plastic Surgery) Raford Riggs, MD as Attending Physician (Cardiology) [place] Jeoffrey HERO, MD as Referring Physician (Family Medicine) Merrilyn Handler, MD (Inactive) as Consulting Physician (General Surgery) Dillingham, Estefana RAMAN, DO as Attending Physician (Plastic Surgery) Elsa Lonni SAUNDERS, MD as Consulting Physician (Orthopedic Surgery) Cleotilde Ronal RAMAN, MD as Consulting Physician (Gynecology) Frazier, [place], OD as Consulting Physician (Optometry)  I have updated your Care Teams any recent Medical Services you may have received from other providers in the past year.     Assessment:   This is a routine wellness examination for Yatziri.  Hearing/Vision screen Hearing Screening - Comments:: Adequate hearing. Vision Screening - Comments:: Adequate vision with eyeglasses and yearly eye exam done by  [facility]   Goals Addressed             This Visit's Progress    02/16/2024:       My goal for 2025-2026 is to continue to lose weight. Get my HgA1C down to below 7% Get down to under 200 pounds.       Depression Screen     02/16/2024    3:01 PM 01/19/2024    9:50 AM 12/02/2023   11:10 AM 12/02/2023   11:00 AM 10/24/2023   10:17 AM 04/13/2023    3:48 PM 02/16/2023    3:14 PM  PHQ 2/9 Scores  PHQ - 2 Score 0  1 0 2 0 0  PHQ- 9 Score 2    6    Exception Documentation  Patient refusal         Fall Risk     02/16/2024    2:59 PM 02/15/2024    5:10  PM 04/13/2023    3:47 PM 02/16/2023    3:15 PM 10/21/2022    9:23 AM  Fall Risk   Falls in the past year? 0 0 0 0 0  Number falls in past yr: 0 0 0 0 0  Injury with Fall? 0 0 0 0 0  Risk for fall due to : No Fall Risks  No Fall Risks No Fall Risks   Follow up Falls evaluation completed  Falls evaluation completed Falls prevention discussed;Education provided;Falls evaluation completed     MEDICARE RISK AT HOME:  Medicare Risk at Home Any stairs in or around the home?: No If so, are there any without handrails?: No Home free of loose throw rugs in walkways, pet beds, electrical cords, etc?: Yes Adequate lighting in your home to reduce risk of falls?: Yes Life alert?: No Use of a cane, walker or w/c?: No Grab bars in the bathroom?: Yes Shower chair or bench in shower?: No Elevated toilet seat or a handicapped toilet?: No  TIMED UP AND GO:  Was the test performed?  No  Cognitive Function: 6CIT completed    02/16/2024    3:00 PM  MMSE - Mini Mental State Exam  Not completed: Unable to complete        02/16/2024    2:58 PM 02/16/2023    3:16 PM  6CIT Screen  What Year? 0 points 0 points  What month? 0 points 0 points  What time? 0 points 0  points  Count back from 20 0 points 0 points  Months in reverse 0 points 0 points  Repeat phrase 0 points 0 points  Total Score 0 points 0 points    Immunizations Immunization History  Administered Date(s) Administered   H1N1 05/20/2008   HPV 9-valent 11/28/2023, 01/19/2024   Influenza Split 05/12/2011   Influenza Whole 06/12/2007, 03/20/2008   Influenza,inj,Quad PF,6+ Mos 06/12/2013, 04/09/2014, 04/07/2015, 07/08/2016, 05/15/2019   Pneumococcal Polysaccharide-23 07/20/2016    Screening Tests Health Maintenance  Topic Date Due   COVID-19 Vaccine (1) Never done   DTaP/Tdap/Td (1 - Tdap) Never done   Hepatitis B Vaccines 19-59 Average Risk (1 of 3 - 19+ 3-dose series) Never done   Influenza Vaccine  01/06/2024   HPV VACCINES  (3 - Risk 3-dose series) 05/29/2024   Medicare Annual Wellness (AWV)  02/15/2025   Colonoscopy  01/11/2026   Cervical Cancer Screening (HPV/Pap Cotest)  04/12/2028   Hepatitis C Screening  Completed   HIV Screening  Completed   Pneumococcal Vaccine  Aged Out   Meningococcal B Vaccine  Aged Out    Health Maintenance Items Addressed: Yes Patient aware of current care gaps.  Patient due for vaccinations.  Additional Screening:  Vision Screening: Recommended annual ophthalmology exams for early detection of glaucoma and other disorders of the eye. Is the patient up to date with their annual eye exam?  Yes  Who is the provider or what is the name of the office in which the patient attends annual eye exams? [place] Frazier, OD at [facility].  Dental Screening: Recommended annual dental exams for proper oral hygiene  Community Resource Referral / Chronic Care Management: CRR required this visit?  No   CCM required this visit?  No   Plan:    I have personally reviewed and noted the following in the patient's chart:   Medical and social history Use of alcohol , tobacco or illicit drugs  Current medications and supplements including opioid prescriptions. Patient is not currently taking opioid prescriptions. Functional ability and status Nutritional status Physical activity Advanced directives List of other physicians Hospitalizations, surgeries, and ER visits in previous 12 months Vitals Screenings to include cognitive, depression, and falls Referrals and appointments  In addition, I have reviewed and discussed with patient certain preventive protocols, quality metrics, and best practice recommendations. A written personalized care plan for preventive services as well as general preventive health recommendations were provided to patient.   Roz LOISE Fuller, LPN   [id number]   After Visit Summary: (MyChart) Due to this being a telephonic visit, the after visit summary  with patients personalized plan was offered to patient via MyChart   Notes: Nothing significant to report at this time.

## 2024-02-16 NOTE — Patient Instructions (Signed)
 Karen Hopkins,  Thank you for taking the time for your Medicare Wellness Visit. I appreciate your continued commitment to your health goals. Please review the care plan we discussed, and feel free to reach out if I can assist you further.  Medicare recommends these wellness visits once per year to help you and your care team stay ahead of potential health issues. These visits are designed to focus on prevention, allowing your provider to concentrate on managing your acute and chronic conditions during your regular appointments.  Please note that Annual Wellness Visits do not include a physical exam. Some assessments may be limited, especially if the visit was conducted virtually. If needed, we may recommend a separate in-[place] follow-up with your provider.  Ongoing Care Seeing your primary care provider every 3 to 6 months helps us  monitor your health and provide consistent, personalized care.   Referrals If a referral was made during today's visit and you haven't received any updates within two weeks, please contact the referred provider directly to check on the status.  Recommended Screenings:  Health Maintenance  Topic Date Due   COVID-19 Vaccine (1) Never done   DTaP/Tdap/Td vaccine (1 - Tdap) Never done   Hepatitis B Vaccine (1 of 3 - 19+ 3-dose series) Never done   Flu Shot  01/06/2024   HPV Vaccine (3 - Risk 3-dose series) 05/29/2024   Medicare Annual Wellness Visit  02/15/2025   Colon Cancer Screening  01/11/2026   Pap with HPV screening  04/12/2028   Hepatitis C Screening  Completed   HIV Screening  Completed   Pneumococcal Vaccine  Aged Out   Meningitis B Vaccine  Aged Out       02/16/2024    2:59 PM  Advanced Directives  Does Patient Have a Medical Advance Directive? No  Would patient like information on creating a medical advance directive? No - Patient declined   Advance Care Planning is important because it: Ensures you receive medical care that aligns with your  values, goals, and preferences. Provides guidance to your family and loved ones, reducing the emotional burden of decision-making during critical moments.  Vision: Annual vision screenings are recommended for early detection of glaucoma, cataracts, and diabetic retinopathy. These exams can also reveal signs of chronic conditions such as diabetes and high blood pressure.  Dental: Annual dental screenings help detect early signs of oral cancer, gum disease, and other conditions linked to overall health, including heart disease and diabetes.  Please see the attached documents for additional preventive care recommendations.

## 2024-02-22 ENCOUNTER — Ambulatory Visit

## 2024-02-22 DIAGNOSIS — I972 Postmastectomy lymphedema syndrome: Secondary | ICD-10-CM | POA: Diagnosis not present

## 2024-02-22 DIAGNOSIS — M25611 Stiffness of right shoulder, not elsewhere classified: Secondary | ICD-10-CM

## 2024-02-22 DIAGNOSIS — Z853 Personal history of malignant neoplasm of breast: Secondary | ICD-10-CM

## 2024-02-22 NOTE — Therapy (Signed)
 OUTPATIENT PHYSICAL THERAPY ONCOLOGY TREATMENT     Patient Name: Karen Hopkins MRN: [id number] DOB:09-Oct-1975, 48 y.o., female Today's Date: 02/22/2024   PT End of Session - 02/22/24 1214     Visit Number 1304    Number of Visits 35    Date for PT Re-Evaluation 02/29/24    PT Start Time 1210    PT Stop Time 1304    PT Time Calculation (min) 54 min    Activity Tolerance Patient tolerated treatment well    Behavior During Therapy [facility] for tasks assessed/performed              Past Medical History:  Diagnosis Date   Anxiety    Arthritis    Cancer (HCC) 2011   Rt. Br. Ca   Depression    Diabetes mellitus without complication (HCC)    has been borderline   Essential hypertension 11/05/2015   H. pylori infection    History of radiation therapy    Hypertension    under control with med., has been on med. x 2 yr.   Lymphedema of arm    right; no BP or puncture to right arm   Personal history of chemotherapy 09/16/2009   rt breast   Pneumonia    Seasonal allergies    Sinus headache    Past Surgical History:  Procedure Laterality Date   BREAST BIOPSY Right 08/26/2009   BREAST REDUCTION SURGERY Left 08/23/2013   Procedure: LEFT BREAST REDUCTION  ;  Surgeon: Estefana Reichert, DO;  Location: [facility];  Service: Plastics;  Laterality: Left;   BREAST REDUCTION WITH MASTOPEXY Left 08/22/2019   Procedure: Left breast mastopexy reduction with liposuction for breast symmetry;  Surgeon: Lowery Estefana RAMAN, DO;  Location: [facility];  Service: Plastics;  Laterality: Left;   CESAREAN SECTION  07/15/2001; 03/18/2004; 11/21/2008   lateral orbiotomy Left 11/2015   Dr. Melba at [facility]   LATISSIMUS FLAP TO BREAST Right 03/12/2013   Procedure: RIGHT BREAST LATISSIMUS FLAP WITH EXPANDER PLACEMENT;  Surgeon: Estefana Reichert, DO;  Location: [facility];  Service: Plastics;  Laterality: Right;   LIPOSUCTION Bilateral 08/23/2013   Procedure: LIPOSUCTION;  Surgeon:  Estefana Reichert, DO;  Location: [facility];  Service: Plastics;  Laterality: Bilateral;   LIPOSUCTION WITH LIPOFILLING Left 08/22/2019   Procedure: LIPOSUCTION WITH LIPOFILLING;  Surgeon: Lowery Estefana RAMAN, DO;  Location: [facility];  Service: Plastics;  Laterality: Left;   MASTECTOMY Right 2011   MODIFIED RADICAL MASTECTOMY Right 03/11/2010   NASAL SEPTUM SURGERY     ORIF ANKLE FRACTURE Left 07/18/2018   Procedure: OPEN REDUCTION INTERNAL FIXATION (ORIF) LEFT ANKLE FRACTURE WITH  SYNDESMOSIS AND ANKLE ARTHROTOMY;  Surgeon: Elsa Lonni SAUNDERS, MD;  Location: [facility];  Service: Orthopedics;  Laterality: Left;   PORT-A-CATH REMOVAL Left 12/01/2010   PORTACATH PLACEMENT Left 09/12/2009   REDUCTION MAMMAPLASTY Left 08/2013   REMOVAL OF TISSUE EXPANDER AND PLACEMENT OF IMPLANT Right 08/23/2013   Procedure: REMOVAL RIGHT TISSUE EXPANDER AND PLACEMENT OF IMPLANT TO RIGHT BREAST ;  Surgeon: Estefana Reichert, DO;  Location:  SURGERY CENTER;  Service: Plastics;  Laterality: Right;   SUPRA-UMBILICAL HERNIA  2006   TUBAL LIGATION  11/21/2008   UMBILICAL HERNIA REPAIR  04/12/2022   CCS DR   Patient Active Problem List   Diagnosis Date Noted   Menorrhagia 01/19/2024   Contraceptive, surveillance, intrauterine device 04/13/2023   Nephrolithiasis 10/21/2022   H. pylori infection 09/25/2022  Abdominal hernia 09/25/2022   Seasonal allergies 09/25/2022   Carpal tunnel syndrome 10/07/2021   Genetic testing 08/14/2019   Breast asymmetry following reconstructive surgery 07/13/2019   Family history of breast cancer    Family history of prostate cancer    Unintentional weight loss 05/21/2019   Lymphedema of right arm 02/27/2018   Dyspnea 09/23/2016   Eczema 08/31/2016   Essential hypertension 11/05/2015   Cervical dystonia 08/25/2015   Headache 07/09/2015   Myofascial muscle pain 04/29/2015   Adhesive capsulitis of right shoulder 04/29/2015    Lower extremity edema 10/01/2014   Malignant neoplasm of overlapping sites of right breast in female, estrogen receptor positive (HCC) 05/24/2014   Myalgia and myositis 05/17/2014   Neoplasm related pain 03/12/2014   Constipation 11/26/2013   H/O reduction mammoplasty 10/30/2013   Seizure (HCC) 10/06/2013   Status post bilateral breast implants 08/31/2013   S/P breast reconstruction, right 08/23/2013   AC joint arthropathy 07/02/2013   Routine adult health maintenance 05/29/2013   Hypokalemia 05/21/2013   Chronic pain 04/19/2013   Acquired absence of breast and absent nipple 01/30/2013   RHINOSINUSITIS, CHRONIC 04/08/2010   Morbid obesity (HCC) 08/04/2006   DEPRESSIVE DISORDER, NOS 08/04/2006    PCP: Laymon Legions, MD  REFERRING PROVIDER: Legions Laymon PARAS, MD   REFERRING DIAG: I89.0 (ICD-10-CM) - Lymphedema of right arm   THERAPY DIAG:  Postmastectomy lymphedema  Stiffness of right shoulder, not elsewhere classified  History of right breast cancer  ONSET DATE: 2011  Rationale for Evaluation and Treatment Rehabilitation  SUBJECTIVE                                                                                                                                                                                           SUBJECTIVE STATEMENT: My Rt arm feels pretty good today. It's not hurting as bad. I still have to really work to keep my shoulder down but I am able to do a little better with it now, especially when I do the exercises.   PERTINENT HISTORY:  Right breast cancer with mastectomy 2011 and ALND 22 nodes; reconstruction; left breast reduction; h/o right UE lymphedema and right upper quadrant pain, treated in this clinic several times before.  PAIN:  Are you having pain? No  PRECAUTIONS: Other: R UE lymphedema, has metal in ankle  RED FLAGS: none  WEIGHT BEARING RESTRICTIONS No  FALLS:  Has patient fallen in last 6 months? No  LIVING  ENVIRONMENT: Lives with: lives with 3 sons Lives in: House/apartment Stairs: No;  Has following equipment at home: None  OCCUPATION: on disability  LEISURE: trying to exercise by  walking but has been bothered by her L ankle pain  HAND DOMINANCE : right   PRIOR LEVEL OF FUNCTION: Independent  PATIENT GOALS to get my arm down and try to maintain it   OBJECTIVE  COGNITION:  Overall cognitive status: Within functional limits for tasks assessed    OBSERVATIONS / OTHER ASSESSMENTS: R upper arm considerable larger than left, area of swelling at posterior trunk just above band on bra  POSTURE: forward head, rounded shoulders  UPPER EXTREMITY AROM/PROM:  A/PROM RIGHT   eval  Right 11/18/23 Right  11/23/23 12/02/23 01/04/24 02/01/24 02/08/24 02/22/24  Shoulder extension 55    54     Shoulder flexion 133 142 145 145 150 141 156 160  Shoulder abduction 85 102 106 162 163 147 162 165  Shoulder internal rotation 40         Shoulder external rotation 60           (Blank rows = not tested)  A/PROM LEFT   eval  Shoulder extension 63  Shoulder flexion 175  Shoulder abduction 171  Shoulder internal rotation 64  Shoulder external rotation 95    (Blank rows = not tested)  MMT: 01/04/24: Rt UE Flex 4/5 and Abd 4-/5     Lt UE 5/5 for flex and abd  02/01/24: Rt UE flex 4+/5 and abd 4/5 02/22/24: Rt UE flex 5/5 and abd 4+/5                 LYMPHEDEMA ASSESSMENTS:  SURGERY TYPE/DATE: R mastectomy and ALND in 2011 NUMBER OF LYMPH NODES REMOVED: 1/20 CHEMOTHERAPY: completed RADIATION:completed Jan 2012 HORMONE TREATMENT: anastrozole  from Nov 2016 to Dec 2019 INFECTIONS: none  LYMPHEDEMA ASSESSMENTS:   LANDMARK RIGHT  Eval on 03/16/22 10/25/23 11/11/23 11/16/23 11/23/23 12/16/23  10 cm proximal to olecranon process 47.1 50.7 47.2 47.4 47.1 46.4  Olecranon process 32.6 34.5 34 34.5 34.1 32.7  10 cm proximal to ulnar styloid process 23.6 26 26.4 26.5 25.4 25.3  Just proximal to ulnar  styloid process 17.7 18 17.7 17.3 17.4 16.9  Across hand at thumb web space 21.4 22 20.2 20.2 20.3 19.2  At base of 2nd digit 6.1 6.2 5.8 6.1 5.8 5.9  (Blank rows = not tested)  LANDMARK LEFT  Eval on 03/16/22 10/25/23  10 cm proximal to olecranon process 43 46  Olecranon process 30 32.4  10 cm proximal to ulnar styloid process 24.4 26.1  Just proximal to ulnar styloid process 17.3 18.6  Across hand at thumb web space 20.6 22.1  At base of 2nd digit 6.4 6.4  (Blank rows = not tested)   Flowsheet Row Outpatient Rehab from 10/25/2023 in [facility]  Lymphedema Life Impact Scale Total Score 72.06 %      TODAY'S TREATMENT  02/22/24: Therapeutic Exercises Pulleys into flex and abd x 2 mins each focusing on Rt scapular depression throughout Roll yellow ball up wall into flex and abd x 15-20 each with 1# added to Rt wrist Therapeutic Activities Forearms on wall with yellow theraband for following: Wall walks x 5 up/down, then scap retract x 5 reps , reviewed proper technique for control at end of scap retract due to she was jerking into end motion causing muscle soreness Forearms on wall for serratus push ups x 10 reviewing proper technique Free Motion Machine: setting 4 for Rt UE ext 7# ext 7# for bil UE ext x 20 each with great technique; then Rt UE add 3#  x 15 reps with near normal scapular rhythm Manual Therapy Lt S/L for STM to Rt rhomboid area where she c/o new muscle tightness and reports this was beneficial  02/08/24: Therapeutic Exercises Pulleys into flex and abd x 2 mins each focusing on Rt scapular depression throughout, pt with improved technique with this today Roll yellow ball up wall into flex and abd x 15-20 each with 1# added to Rt wrist; pt reports conts to be challenged by wrist weight while trying to keep scapula depressed Therapeutic Activities Forearms on wall with yellow theraband for following: Wall walks x 5 up/down, then scap retract 2  x 5 reps with rest break between sets due to muscle fatigue; pt reports these were challenging and she could feel good muscle contraction at lateral trunk; then serratus forearm push ups x 10 returning therapist demo Free Motion Machine: setting 4 for Rt UE add 7# x 7 reps with tactile cuing to decrease UT engagement and scap engagement, then 3# x 12 reps with little compensation, and then ext 7# for bil UE ext x 20 each with improved technique Manual Therapy STM to Rt lateral trunk with trial of dynamic cupping using cocoa butter; focused on area of inferior to lymphedema at lateral trunk to chest wall Kinesiotape (after washing cocoa butter off skin with soap/water) to Rt lateral trunk in I band fashion beginning inferior to edema at lateral trunk and pulling tape up to cover pocket of swelling.   02/01/24: Therapeutic Exercisesin Lt S/L to Lt lateral trunk  Pulleys into flex and abd x 2 mins each focusing on Rt scapular depression throughout Roll yellow ball up wall into flex and abd x 15-20 each with 1# added to Rt wrist, pt reports feeling challenged by this Therapeutic Activities Free Motion Machine 3# with arms on setting 4 for Rt UE add and then ext and then 7# for bil UE ext x 20 each with tactile cuing for reinforcement of correct technique  Prone with arm hanging off elevated mat table for following: T , Y, and ext x 12 each with demo of each and VC's during for technique, then trial of ext with arm on mat table by pt unable to lift UE off table without compensations Manual Therapy P/ROM to Rt shoulder into flex, abd and D2 with scapular depression by therapist throughout STM to scar tissue in Rt axilla A/ROM measurements and MMT done for renewal      PATIENT EDUCATION:  Education details: Scapular stability with forearms on wall Rosten educated: Patient Education method: Explanation, demonstration, VC's and handout issued (emailed [employer] link) Education comprehension:  verbalized understanding, returned demonstration, will benefit from further review  HOME EXERCISE PROGRAM: 12/26/23 - Bil UE 3 way raises 01/04/24 - Access Code [email or web address] for prone Rt shoulder strengthening 02/08/24 - Access Code: [email or web address] URL: [email or web address] Date: 02/08/2024 Prepared by: Berwyn Knights  Exercises - Forearm Walks on Wall with Resistance Band  - 1 x daily - 7 x weekly - 2-3 sets - 5-7 reps - Serratus Forearm Push Up Plus at Wall with Elbows  - 1 x daily - 7 x weekly - 2-3 sets - 5-7 reps  ASSESSMENT:  CLINICAL IMPRESSION: Pt has done excellent this episode of physical therapy. She progressed through the Active Phase of lymphedema treatment to the Maintenance Phase and has been wearing her day time compression garments consistently. Then is doing very well now with independence with HEP for Rt scapular strength. Pt reports new soreness at Rt mid  back around rhomboids and this is probable due to new muscle soreness from new exercises which is a good thing. She is ready for D/C at this time.     OBJECTIVE IMPAIRMENTS decreased ROM and increased edema.   ACTIVITY LIMITATIONS carrying, lifting, and reach over head  PARTICIPATION LIMITATIONS: none  PERSONAL FACTORS Time since onset of injury/illness/exacerbation are also affecting patient's functional outcome.   REHAB POTENTIAL: Good  CLINICAL DECISION MAKING: Stable/uncomplicated  EVALUATION COMPLEXITY: Low  GOALS: Goals reviewed with patient? Yes  SHORT TERM GOALS: Target date: 11/15/23   Pt will demonstrate a 2 cm reduction in edema at 10 cm proximal to olecranon process.  Baseline: 50.7 cm; 11/23/23 - 47.1 (3.6 cm reduction) Goal status: MET  2.  Pt will demonstrate 110 degrees of R shoulder abduction to allow pt to reach out to the side.  Baseline: 85; 11/23/23 - 106 Goal status: MET  3.  Pt will obtain a compression bra for long term management of truncal edema especially posterior..   Baseline:  Goal status: MET  4.  Pt will demonstrate 150 degrees of R shoulder flexion to allow her to reach overhead.  Baseline: 133; 11/23/23 - 145 degrees, 12/05/23 - 145 deg; 01/04/24 - 150 deg  Goal status: MET   LONG TERM GOALS: Target date: 02/01/24    Pt will demonstrate a 3 cm reduction in edema at 10 cm proximal to olecranon process.  Baseline: 50.7 cm; 11/23/23 - 47.1 cm (3.6 cm reduction) Goal status: MET  2.  Pt will demonstrate 165 degrees of R shoulder abduction to allow pt to reach out to the side.  Baseline: 85 degrees; 11/23/23 - 106 degrees, 12/05/23 - 165; 01/04/24 - 163 degrees with compensation (pt struggles with limiting this due to weakness); 147 degrees but pt able to do today without scapula compensation so progress noted; 02/08/24 - 162 degrees; 02/22/24 - 165 degrees  Goal status: MET  3.  Pt will obtain appropriate day time and night time garments for long term management of R UE lymphedema.  Baseline: Needs new garments; 11/23/23 - pt was just measured today for new day and nighttime garments; 12/26/23 - pt now has new garments Goal status: MET  4.  Pt will be able to independently manage her lymphedema through self MLD and compression garments.  Baseline: Pt is independent with self MLD and has been measured for compression garments; 01/04/24 - pt now has her new compression garments Goal status: MET  5.  Pt will demonstrate 165 degrees of R shoulder flexion to allow her to reach overhead.  Baseline: 133 degrees; 11/23/23 - 145 degrees; 141 degrees with no scapular compensation; 02/08/24 - 156 degrees; 02/22/24 - 160 degrees Goal status: PARTIALLY MET  6.  Pt will demonstrate improved Rt shoulder strength to 4+/5 to equal Lt for improved ease with washing and combing her hair.  Baseline: Rt shoulder MMT flex 4/5 and abd 4-/5; Rt shoulder flex 4+/5 and abd 4/5; 02/22/24 - Rt shoulder 5/5 flex and 4+/5 abd  Goal Status: MET   PLAN: PT FREQUENCY: 1x per week   PT  DURATION: 4 weeks   PLANNED INTERVENTIONS: Therapeutic exercises, Therapeutic activity, Patient/Family education, Self Care, Joint mobilization, Orthotic/Fit training, Manual lymph drainage, Compression bandaging, Vasopneumatic device, Manual therapy, and Re-evaluation  PLAN FOR NEXT SESSION: D/C this visit.   Aden Berwyn Caldron, PTA 02/22/2024, 1:27 PM   PHYSICAL THERAPY DISCHARGE SUMMARY  Visits from Start of Care: 27  Current functional level related  to goals / functional outcomes: See above   Remaining deficits: Chronic lymphedema    Education / Equipment: Final self care plan   Plan: Patient agrees to discharge.   Patient is being discharged due to meeting the stated rehab goals.    Saddie Raw, PT

## 2024-03-08 ENCOUNTER — Ambulatory Visit: Admitting: Family Medicine

## 2024-03-08 ENCOUNTER — Encounter: Payer: Self-pay | Admitting: Family Medicine

## 2024-03-08 VITALS — BP 105/58 | HR 82 | Ht 61.0 in | Wt 256.4 lb

## 2024-03-08 DIAGNOSIS — Z0000 Encounter for general adult medical examination without abnormal findings: Secondary | ICD-10-CM | POA: Diagnosis not present

## 2024-03-08 DIAGNOSIS — K5900 Constipation, unspecified: Secondary | ICD-10-CM | POA: Diagnosis not present

## 2024-03-08 DIAGNOSIS — Z23 Encounter for immunization: Secondary | ICD-10-CM | POA: Diagnosis not present

## 2024-03-08 MED ORDER — POLYETHYLENE GLYCOL 3350 17 GM/SCOOP PO POWD: 17.0000 g | Freq: Every day | ORAL | 0 refills | Status: DC | PRN

## 2024-03-08 MED ORDER — OZEMPIC (1 MG/DOSE) 4 MG/3ML ~~LOC~~ SOPN: 1.0000 mg | PEN_INJECTOR | SUBCUTANEOUS | 0 refills | Status: DC

## 2024-03-08 NOTE — Patient Instructions (Signed)
 It was great to see you again today.  Starting hepatitis B vaccine series today  After you finish the 4th week of ozempic  0.5mg  dose, increase to 1mg  weekly Sent this in for you  Also sent in miralax  for constipation  Follow up in 1 month   Be well, Dr. Donah

## 2024-03-08 NOTE — Progress Notes (Signed)
  Date of Visit: 03/08/2024   SUBJECTIVE:   HPI:  Discussed the use of AI scribe software for clinical note transcription with the patient, who gave verbal consent to proceed.  History of Present Illness Karen Hopkins is a 48 year old female who presents for follow-up on Ozempic  treatment for weight loss.  Weight management and semaglutide  (ozempic ) therapy - On Ozempic  0.5 mg weekly for three weeks for weight loss - Total weight loss of 15 pounds, from 265 to 250 pounds - No further weight loss since last visit; perceives current weight loss as slow compared to initial rapid loss - Adheres to dietary recommendations - Increased physical activity, including treadmill use at home and step tracking with a watch  Gastrointestinal side effects - Mild constipation associated with Ozempic  use - Manages constipation with fiber gummies and increased intake of fruits and vegetables - Aware that others on similar treatment experience constipation - Norco use is limited due to awareness of its contribution to constipation   OBJECTIVE:   BP (!) 105/58   Pulse 82   Ht 5' 1 (1.549 m)   Wt 256 lb 6.4 oz (116.3 kg)   SpO2 99%   BMI 48.45 kg/m  Gen: no acute distress, pleasant cooperative HEENT: normocephalic, atraumatic  Heart: regular rate and rhythm, no murmur Lungs: clear to auscultation bilaterally, normal work of breathing  Neuro: alert, speech normal, nonfocal grossly Ext: no lower extremity edema bilaterally  ASSESSMENT/PLAN:   Assessment & Plan Morbid obesity (HCC) Obesity management with Ozempic . She lost 15 pounds but plateaued. Discussed increasing to 1 mg dose to enhance weight loss, explaining gradual increase to avoid side effects. Engaging in regular exercise and dietary modifications. - Increase Ozempic  to 1 mg after four weeks on 0.5 mg. - Continue regular exercise and dietary modifications. - Send prescription for Ozempic  to [facility]. Routine adult health  maintenance Discussed immunization status and reassured her that the flu vaccine does not cause the flu. - Administer Hepatitis B vaccine during this visit (titers negative last visit) - Plan for flu vaccine at next visit. - Remind to schedule TDAP vaccine at [facility]. Constipation, unspecified constipation type Mild constipation likely related to Ozempic  and possibly exacerbated by Norco. Using fiber gummies and dietary modifications but still constipated. - Start Miralax , one capful daily, adjust as needed. - Continue fiber gummies and dietary modifications.  FOLLOW UP: Follow up in 1 month for above issues  [place] J. Donah, MD [facility]

## 2024-03-10 NOTE — Assessment & Plan Note (Signed)
 Discussed immunization status and reassured her that the flu vaccine does not cause the flu. - Administer Hepatitis B vaccine during this visit (titers negative last visit) - Plan for flu vaccine at next visit. - Remind to schedule TDAP vaccine at [facility].

## 2024-03-10 NOTE — Assessment & Plan Note (Signed)
 Mild constipation likely related to Ozempic  and possibly exacerbated by Norco. Using fiber gummies and dietary modifications but still constipated. - Start Miralax , one capful daily, adjust as needed. - Continue fiber gummies and dietary modifications.

## 2024-03-10 NOTE — Assessment & Plan Note (Signed)
 Obesity management with Ozempic . She lost 15 pounds but plateaued. Discussed increasing to 1 mg dose to enhance weight loss, explaining gradual increase to avoid side effects. Engaging in regular exercise and dietary modifications. - Increase Ozempic  to 1 mg after four weeks on 0.5 mg. - Continue regular exercise and dietary modifications. - Send prescription for Ozempic  to [facility].

## 2024-03-20 ENCOUNTER — Other Ambulatory Visit: Payer: Self-pay

## 2024-03-20 MED ORDER — OMEPRAZOLE 40 MG PO CPDR: 40.0000 mg | DELAYED_RELEASE_CAPSULE | Freq: Every day | ORAL | 0 refills | Status: AC

## 2024-03-20 NOTE — Progress Notes (Signed)
 Karen Hopkins                                          MRN: [id number]   03/20/2024   The [facility] Quality Team Specialist reviewed this patient medical record for the purposes of chart review for care gap closure. The following were reviewed: chart review for care gap closure-kidney health evaluation for diabetes:eGFR  and uACR.    [facility] Quality Team

## 2024-03-22 ENCOUNTER — Ambulatory Visit
Admission: RE | Admit: 2024-03-22 | Discharge: 2024-03-22 | Disposition: A | Discharge status: Home / Self Care | Source: Ambulatory Visit | Attending: Certified Nurse Midwife | Admitting: Certified Nurse Midwife

## 2024-03-22 ENCOUNTER — Other Ambulatory Visit

## 2024-03-22 DIAGNOSIS — N6489 Other specified disorders of breast: Secondary | ICD-10-CM

## 2024-03-22 DIAGNOSIS — R928 Other abnormal and inconclusive findings on diagnostic imaging of breast: Secondary | ICD-10-CM | POA: Diagnosis not present

## 2024-03-23 ENCOUNTER — Encounter: Payer: Self-pay | Admitting: Family Medicine

## 2024-03-29 ENCOUNTER — Ambulatory Visit: Admitting: Urology

## 2024-03-30 ENCOUNTER — Ambulatory Visit (INDEPENDENT_AMBULATORY_CARE_PROVIDER_SITE_OTHER): Admitting: Urology

## 2024-03-30 ENCOUNTER — Encounter: Payer: Self-pay | Admitting: Urology

## 2024-03-30 ENCOUNTER — Ambulatory Visit (HOSPITAL_BASED_OUTPATIENT_CLINIC_OR_DEPARTMENT_OTHER)
Admission: RE | Admit: 2024-03-30 | Discharge: 2024-03-30 | Disposition: A | Discharge status: Home / Self Care | Source: Ambulatory Visit | Attending: Urology | Admitting: Urology

## 2024-03-30 VITALS — BP 130/84 | HR 97 | Ht 64.0 in | Wt 256.0 lb

## 2024-03-30 DIAGNOSIS — R1031 Right lower quadrant pain: Secondary | ICD-10-CM

## 2024-03-30 DIAGNOSIS — N200 Calculus of kidney: Secondary | ICD-10-CM | POA: Diagnosis not present

## 2024-03-30 LAB — URINALYSIS, ROUTINE W REFLEX MICROSCOPIC
Bilirubin, UA: NEGATIVE
Glucose, UA: NEGATIVE
Ketones, UA: NEGATIVE
Leukocytes,UA: NEGATIVE
Nitrite, UA: NEGATIVE
Specific Gravity, UA: 1.015 (ref 1.005–1.030)
Urobilinogen, Ur: 1 mg/dL (ref 0.2–1.0)
pH, UA: 8.5 — ABNORMAL HIGH (ref 5.0–7.5)

## 2024-03-30 LAB — MICROSCOPIC EXAMINATION

## 2024-03-30 NOTE — Progress Notes (Signed)
 Assessment: 1. Nephrolithiasis   2. Right lower quadrant abdominal pain     Plan: I reviewed the KUB study from today.  I did not see an obvious stone that should be causing her flank and abdominal pain. I discussed further evaluation with CT imaging.  She would like to proceed. CT renal stone study next available to evaluate for right sided abdominal and flank pain with history of nephrolithiasis. Will contact her with results.  Chief Complaint:  Chief Complaint  Patient presents with   Nephrolithiasis    History of Present Illness:  Karen Hopkins is a 48 y.o. female who is seen for further evaluation of nephrolithiasis. At her initial visit in June 2024, she had been evaluated for abdominal pain and possible hernia. CT imaging showed a punctate nonobstructing left renal calculus and an 8 mm inferior pole right renal calculus without obstruction. No prior history of kidney stones.  No flank pain.  No dysuria or gross hematuria.  No recent UTIs.  CT imaging from 6/20 showed a 5 mm calculus in the inferior pole of the right kidney.  At her visit in March 2025, she was not having any flank pain or lower urinary tract symptoms.  No dysuria or gross hematuria. KUB showed a 7 mm calcification in the lower right renal shadow.  She presents today for evaluation of right sided abdominal and flank pain.  She reports onset of symptoms approximately 1 week ago.  She has had pain in the right lower quadrant and in the right flank area.  No lower urinary tract symptoms.  No dysuria or gross hematuria.  No nausea or vomiting.   Portions of the above documentation were copied from a prior visit for review purposes only.    Past Medical History:  Past Medical History:  Diagnosis Date   Anxiety    Arthritis    Cancer (HCC) 2011   Rt. Br. Ca   Depression    Diabetes mellitus without complication (HCC)    has been borderline   Essential hypertension 11/05/2015   H. pylori  infection    History of radiation therapy    Hypertension    under control with med., has been on med. x 2 yr.   Lymphedema of arm    right; no BP or puncture to right arm   Personal history of chemotherapy 09/16/2009   rt breast   Pneumonia    Seasonal allergies    Sinus headache     Past Surgical History:  Past Surgical History:  Procedure Laterality Date   BREAST BIOPSY Right 08/26/2009   BREAST REDUCTION SURGERY Left 08/23/2013   Procedure: LEFT BREAST REDUCTION  ;  Surgeon: Estefana Reichert, DO;  Location: [facility];  Service: Plastics;  Laterality: Left;   BREAST REDUCTION WITH MASTOPEXY Left 08/22/2019   Procedure: Left breast mastopexy reduction with liposuction for breast symmetry;  Surgeon: Lowery Estefana RAMAN, DO;  Location: [facility];  Service: Plastics;  Laterality: Left;   CESAREAN SECTION  07/15/2001; 03/18/2004; 11/21/2008   lateral orbiotomy Left 11/2015   Dr. Melba at [facility]   LATISSIMUS FLAP TO BREAST Right 03/12/2013   Procedure: RIGHT BREAST LATISSIMUS FLAP WITH EXPANDER PLACEMENT;  Surgeon: Estefana Reichert, DO;  Location: [facility];  Service: Plastics;  Laterality: Right;   LIPOSUCTION Bilateral 08/23/2013   Procedure: LIPOSUCTION;  Surgeon: Estefana Reichert, DO;  Location: [facility];  Service: Plastics;  Laterality: Bilateral;   LIPOSUCTION WITH LIPOFILLING Left  08/22/2019   Procedure: LIPOSUCTION WITH LIPOFILLING;  Surgeon: Lowery Estefana RAMAN, DO;  Location: [facility];  Service: Plastics;  Laterality: Left;   MASTECTOMY Right 2011   MODIFIED RADICAL MASTECTOMY Right 03/11/2010   NASAL SEPTUM SURGERY     ORIF ANKLE FRACTURE Left 07/18/2018   Procedure: OPEN REDUCTION INTERNAL FIXATION (ORIF) LEFT ANKLE FRACTURE WITH  SYNDESMOSIS AND ANKLE ARTHROTOMY;  Surgeon: Elsa Lonni SAUNDERS, MD;  Location: [facility];  Service: Orthopedics;  Laterality: Left;   PORT-A-CATH REMOVAL Left 12/01/2010    PORTACATH PLACEMENT Left 09/12/2009   REDUCTION MAMMAPLASTY Left 08/2013   REMOVAL OF TISSUE EXPANDER AND PLACEMENT OF IMPLANT Right 08/23/2013   Procedure: REMOVAL RIGHT TISSUE EXPANDER AND PLACEMENT OF IMPLANT TO RIGHT BREAST ;  Surgeon: Estefana Reichert, DO;  Location: [facility];  Service: Plastics;  Laterality: Right;   SUPRA-UMBILICAL HERNIA  2006   TUBAL LIGATION  11/21/2008   UMBILICAL HERNIA REPAIR  04/12/2022   CCS DR    Allergies:  Allergies  Allergen Reactions   Penicillins Swelling    FACIAL SWELLING Did it involve swelling of the face/tongue/throat, SOB, or low BP? Yes Did it involve sudden or severe rash/hives, skin peeling, or any reaction on the inside of your mouth or nose? Unknown Did you need to seek medical attention at a hospital or doctor's office? No When did it last happen? More than 10 years If all above answers are NO, may proceed with cephalosporin use.    Lyrica  [Pregabalin ] Nausea Only    .   Meloxicam  Nausea Only   Robaxin  [Methocarbamol ] Nausea Only    Family History:  Family History  Problem Relation Age of Onset   Hypertension Mother    Breast cancer Mother 15   Diabetes type II Father    Prostate cancer Father 4   Stroke Neg Hx    Colon polyps Neg Hx    Esophageal cancer Neg Hx    Pancreatic cancer Neg Hx    Stomach cancer Neg Hx    Rectal cancer Neg Hx     Social History:  Social History   Tobacco Use   Smoking status: Former    Current packs/day: 0.00    Types: Cigarettes    Start date: 06/07/2002    Quit date: 05/08/2019    Years since quitting: 4.8   Smokeless tobacco: Never  Vaping Use   Vaping status: Never Used  Substance Use Topics   Alcohol  use: No   Drug use: No    ROS: Constitutional:  Negative for fever, chills, weight loss CV: Negative for chest pain, previous MI, hypertension Respiratory:  Negative for shortness of breath, wheezing, sleep apnea, frequent cough GI:  Negative for nausea,  vomiting, bloody stool, GERD  Physical exam: BP 130/84   Pulse 97   Ht 5' 4 (1.626 m)   Wt 256 lb (116.1 kg)   BMI 43.94 kg/m  GENERAL APPEARANCE:  Well appearing, well developed, well nourished, NAD HEENT:  Atraumatic, normocephalic, oropharynx clear NECK:  Supple without lymphadenopathy or thyromegaly ABDOMEN:  Soft, non-tender, no masses EXTREMITIES:  Moves all extremities well, without clubbing, cyanosis, or edema NEUROLOGIC:  Alert and oriented x 3, normal gait, CN II-XII grossly intact MENTAL STATUS:  appropriate BACK:  Non-tender to palpation, No CVAT SKIN:  Warm, dry, and intact  Results: U/A: 0-5 WBCs, 0-2 RBCs  KUB: Visualization of the right renal shadow is somewhat limited due to overlying bowel gas and stool.  The previously noted 7-8 mm calcification seen in the lower right renal shadow is again evident.  I do not appreciate any other obvious calcifications along the expected course of the right ureter.

## 2024-04-06 ENCOUNTER — Other Ambulatory Visit: Payer: Self-pay | Admitting: Family Medicine

## 2024-04-06 ENCOUNTER — Ambulatory Visit (HOSPITAL_COMMUNITY)
Admission: RE | Admit: 2024-04-06 | Discharge: 2024-04-06 | Disposition: A | Discharge status: Home / Self Care | Source: Ambulatory Visit | Attending: Urology | Admitting: Urology

## 2024-04-06 DIAGNOSIS — N200 Calculus of kidney: Secondary | ICD-10-CM | POA: Diagnosis present

## 2024-04-06 DIAGNOSIS — R1031 Right lower quadrant pain: Secondary | ICD-10-CM | POA: Diagnosis present

## 2024-04-06 NOTE — Telephone Encounter (Signed)
 Pt request refill of: Ozempic   Name of Medication(s):  Ozempic  Last date of OV:  03/08/2024 Pharmacy:  [facility] on [address]  Will route refill request to Team CMA.  Discussed with patient policy to call pharmacy for future refills.  Also, discussed refills may take up to 48 hours to approve or deny.  Marjorie DELENA Kiang  She also wanted to know if Dr Donah was going to increase he dosages.

## 2024-04-10 ENCOUNTER — Ambulatory Visit: Payer: Self-pay | Admitting: Urology

## 2024-04-13 ENCOUNTER — Emergency Department (HOSPITAL_COMMUNITY)
Admission: EM | Admit: 2024-04-13 | Discharge: 2024-04-13 | Disposition: A | Discharge status: Home / Self Care | Attending: Emergency Medicine | Admitting: Emergency Medicine

## 2024-04-13 ENCOUNTER — Other Ambulatory Visit: Payer: Self-pay

## 2024-04-13 ENCOUNTER — Encounter (HOSPITAL_COMMUNITY): Payer: Self-pay

## 2024-04-13 ENCOUNTER — Emergency Department (HOSPITAL_COMMUNITY)

## 2024-04-13 DIAGNOSIS — E119 Type 2 diabetes mellitus without complications: Secondary | ICD-10-CM | POA: Diagnosis not present

## 2024-04-13 DIAGNOSIS — Z853 Personal history of malignant neoplasm of breast: Secondary | ICD-10-CM | POA: Diagnosis not present

## 2024-04-13 DIAGNOSIS — N132 Hydronephrosis with renal and ureteral calculous obstruction: Secondary | ICD-10-CM | POA: Diagnosis not present

## 2024-04-13 DIAGNOSIS — Z794 Long term (current) use of insulin: Secondary | ICD-10-CM | POA: Diagnosis not present

## 2024-04-13 DIAGNOSIS — I10 Essential (primary) hypertension: Secondary | ICD-10-CM | POA: Diagnosis not present

## 2024-04-13 DIAGNOSIS — R1031 Right lower quadrant pain: Secondary | ICD-10-CM | POA: Diagnosis present

## 2024-04-13 DIAGNOSIS — N201 Calculus of ureter: Secondary | ICD-10-CM

## 2024-04-13 LAB — COMPREHENSIVE METABOLIC PANEL WITH GFR
ALT: 19 U/L (ref 0–44)
AST: 23 U/L (ref 15–41)
Albumin: 4 g/dL (ref 3.5–5.0)
Alkaline Phosphatase: 60 U/L (ref 38–126)
Anion gap: 10 (ref 5–15)
BUN: 8 mg/dL (ref 6–20)
CO2: 22 mmol/L (ref 22–32)
Calcium: 9.3 mg/dL (ref 8.9–10.3)
Chloride: 106 mmol/L (ref 98–111)
Creatinine, Ser: 1.01 mg/dL — ABNORMAL HIGH (ref 0.44–1.00)
GFR, Estimated: >60 mL/min (ref >60)
Glucose, Bld: 113 mg/dL — ABNORMAL HIGH (ref 70–99)
Potassium: 3.9 mmol/L (ref 3.5–5.1)
Sodium: 138 mmol/L (ref 135–145)
Total Bilirubin: 0.5 mg/dL (ref 0.0–1.2)
Total Protein: 8 g/dL (ref 6.5–8.1)

## 2024-04-13 LAB — CBC WITH DIFFERENTIAL/PLATELET
Abs Immature Granulocytes: 0.04 K/uL (ref 0.00–0.07)
Basophils Absolute: 0 K/uL (ref 0.0–0.1)
Basophils Relative: 0 %
Eosinophils Absolute: 0.1 K/uL (ref 0.0–0.5)
Eosinophils Relative: 1 %
HCT: 44.6 % (ref 36.0–46.0)
Hemoglobin: 14.8 g/dL (ref 12.0–15.0)
Immature Granulocytes: 0 %
Lymphocytes Relative: 11 %
Lymphs Abs: 1.6 K/uL (ref 0.7–4.0)
MCH: 29.9 pg (ref 26.0–34.0)
MCHC: 33.2 g/dL (ref 30.0–36.0)
MCV: 90.1 fL (ref 80.0–100.0)
Monocytes Absolute: 0.5 K/uL (ref 0.1–1.0)
Monocytes Relative: 4 %
Neutro Abs: 12.4 K/uL — ABNORMAL HIGH (ref 1.7–7.7)
Neutrophils Relative %: 84 %
Platelets: 359 K/uL (ref 150–400)
RBC: 4.95 MIL/uL (ref 3.87–5.11)
RDW: 13.8 % (ref 11.5–15.5)
WBC: 14.7 K/uL — ABNORMAL HIGH (ref 4.0–10.5)
nRBC: 0 % (ref 0.0–0.2)

## 2024-04-13 LAB — URINALYSIS, ROUTINE W REFLEX MICROSCOPIC
Bacteria, UA: NONE SEEN
Bilirubin Urine: NEGATIVE
Glucose, UA: NEGATIVE mg/dL
Ketones, ur: 5 mg/dL — AB
Leukocytes,Ua: NEGATIVE
Nitrite: NEGATIVE
Protein, ur: NEGATIVE mg/dL
Specific Gravity, Urine: >1.046 — ABNORMAL HIGH (ref 1.005–1.030)
pH: 7 (ref 5.0–8.0)

## 2024-04-13 LAB — HCG, SERUM, QUALITATIVE: Preg, Serum: NEGATIVE

## 2024-04-13 LAB — I-STAT CHEM 8, ED
BUN: 10 mg/dL (ref 6–20)
Calcium, Ion: 1.19 mmol/L (ref 1.15–1.40)
Chloride: 109 mmol/L (ref 98–111)
Creatinine, Ser: 0.9 mg/dL (ref 0.44–1.00)
Glucose, Bld: 119 mg/dL — ABNORMAL HIGH (ref 70–99)
HCT: 46 % (ref 36.0–46.0)
Hemoglobin: 15.6 g/dL — ABNORMAL HIGH (ref 12.0–15.0)
Potassium: 4.2 mmol/L (ref 3.5–5.1)
Sodium: 140 mmol/L (ref 135–145)
TCO2: 19 mmol/L — ABNORMAL LOW (ref 22–32)

## 2024-04-13 LAB — LIPASE, BLOOD: Lipase: 34 U/L (ref 11–51)

## 2024-04-13 MED ORDER — OXYCODONE-ACETAMINOPHEN 5-325 MG PO TABS
2.0000 | ORAL_TABLET | Freq: Once | ORAL | Status: AC
  Administered 2024-04-13: 2 via ORAL
  Filled 2024-04-13: qty 2

## 2024-04-13 MED ORDER — SODIUM CHLORIDE 0.9 % IV SOLN: INTRAVENOUS | Status: DC

## 2024-04-13 MED ORDER — HYDROMORPHONE HCL 1 MG/ML IJ SOLN
1.0000 mg | Freq: Once | INTRAMUSCULAR | Status: AC
  Administered 2024-04-13: 1 mg via INTRAVENOUS
  Filled 2024-04-13: qty 1

## 2024-04-13 MED ORDER — ONDANSETRON HCL 4 MG/2ML IJ SOLN
4.0000 mg | Freq: Once | INTRAMUSCULAR | Status: AC
  Administered 2024-04-13: 4 mg via INTRAVENOUS
  Filled 2024-04-13: qty 2

## 2024-04-13 MED ORDER — IOHEXOL 350 MG/ML SOLN
100.0000 mL | Freq: Once | INTRAVENOUS | Status: AC | PRN
  Administered 2024-04-13: 100 mL via INTRAVENOUS

## 2024-04-13 MED ORDER — SODIUM CHLORIDE 0.9 % IV BOLUS
1000.0000 mL | Freq: Once | INTRAVENOUS | Status: AC
  Administered 2024-04-13: 1000 mL via INTRAVENOUS

## 2024-04-13 MED ORDER — ONDANSETRON 4 MG PO TBDP
4.0000 mg | ORAL_TABLET | Freq: Once | ORAL | Status: AC
  Administered 2024-04-13: 4 mg via ORAL
  Filled 2024-04-13: qty 1

## 2024-04-13 MED ORDER — ONDANSETRON 4 MG PO TBDP: 4.0000 mg | ORAL_TABLET | Freq: Three times a day (TID) | ORAL | 0 refills | Status: AC | PRN

## 2024-04-13 MED ORDER — IBUPROFEN 800 MG PO TABS: 800.0000 mg | ORAL_TABLET | Freq: Three times a day (TID) | ORAL | 0 refills | Status: AC

## 2024-04-13 MED ORDER — OXYCODONE HCL 5 MG PO TABS: 2.5000 mg | ORAL_TABLET | ORAL | 0 refills | Status: DC | PRN

## 2024-04-13 NOTE — ED Notes (Signed)
 Redrew CMP and Lipase, sent to lab

## 2024-04-13 NOTE — ED Provider Notes (Addendum)
 [employer] [facility] EMERGENCY DEPARTMENT AT [facility] Provider Note   CSN: [id number] Arrival date & time: 04/13/24  1212     Patient presents with: No chief complaint on file.   Karen Hopkins is a 48 y.o. female.   Patient with severe right sided direct flank right lower quadrant abdominal pain that started yesterday with multiple episodes of vomiting.  Patient did have CT renal scan done by Dr. Roseann on November 1.  But everything was up in the kidneys at that time.  Past medical history significant for right breast lymphedema of that right arm chemotherapy due to the right breast cancer this was all in 2011 diabetes radiation therapy hypertension.  No prior history of kidney stones.  No blood in the urine no blood in the vomit.       Prior to Admission medications   Medication Sig Start Date End Date Taking? Authorizing Provider  cyclobenzaprine  (FLEXERIL ) 10 MG tablet Take 10 mg by mouth 2 (two) times daily as needed.    [provider]  fexofenadine  (ALLEGRA ) 180 MG tablet Take 1 tablet (180 mg total) by mouth daily. 10/24/23   Donah Laymon PARAS, MD  fluticasone  (FLONASE ) 50 MCG/ACT nasal spray SHAKE LIQUID AND USE 2 SPRAYS IN EACH NOSTRIL DAILY 11/28/23   Donah Laymon PARAS, MD  gabapentin  (NEURONTIN ) 100 MG capsule Take 200 mg by mouth 2 (two) times daily.    [provider]  HYDROcodone -acetaminophen  (NORCO/VICODIN) 5-325 MG tablet Take 1 tablet by mouth every 8 (eight) hours as needed for severe pain.    [provider]  levonorgestrel  (MIRENA ) 20 MCG/24HR IUD 1 each by Intrauterine route once.    [provider]  omeprazole  (PRILOSEC) 40 MG capsule Take 1 capsule (40 mg total) by mouth daily. NEED APPOINTMENT FOR ADDITIONAL REFILL 03/20/24   Legrand Victory LITTIE DOUGLAS, MD  polyethylene glycol powder (MIRALAX ) 17 GM/SCOOP powder Take 17 g by mouth daily as needed (constipation). 03/08/24   Donah Laymon PARAS, MD  Semaglutide , 1 MG/DOSE,  (OZEMPIC , 1 MG/DOSE,) 4 MG/3ML SOPN Inject 1 mg into the skin once a week. 03/08/24   Donah Laymon PARAS, MD    Allergies: Penicillins, Lyrica  [pregabalin ], Meloxicam , and Robaxin  [methocarbamol ]    Review of Systems  Constitutional:  Negative for chills and fever.  HENT:  Negative for ear pain and sore throat.   Eyes:  Negative for pain and visual disturbance.  Respiratory:  Negative for cough and shortness of breath.   Cardiovascular:  Negative for chest pain and palpitations.  Gastrointestinal:  Positive for abdominal pain, nausea and vomiting.  Genitourinary:  Positive for dysuria and flank pain. Negative for hematuria.  Musculoskeletal:  Negative for arthralgias and back pain.  Skin:  Negative for color change and rash.  Neurological:  Negative for seizures and syncope.  All other systems reviewed and are negative.   Updated Vital Signs BP (!) 141/92 (BP Location: Right Arm)   Pulse 88   Temp 97.7 F (36.5 C) (Oral)   Resp 16   Ht 1.626 m (5' 4)   Wt 115.7 kg   SpO2 98%   BMI 43.77 kg/m   Physical Exam Vitals and nursing note reviewed.  Constitutional:      General: She is not in acute distress.    Appearance: Normal appearance. She is well-developed.  HENT:     Head: Normocephalic and atraumatic.  Eyes:     Extraocular Movements: Extraocular movements intact.     Conjunctiva/sclera: Conjunctivae  normal.     Pupils: Pupils are equal, round, and reactive to light.  Cardiovascular:     Rate and Rhythm: Normal rate and regular rhythm.     Heart sounds: No murmur heard. Pulmonary:     Effort: Pulmonary effort is normal. No respiratory distress.     Breath sounds: Normal breath sounds.  Abdominal:     General: There is no distension.     Palpations: Abdomen is soft.     Tenderness: There is no abdominal tenderness. There is no guarding.  Musculoskeletal:        General: No swelling.     Cervical back: Normal range of motion and neck supple.  Skin:    General:  Skin is warm and dry.     Capillary Refill: Capillary refill takes less than 2 seconds.  Neurological:     General: No focal deficit present.     Mental Status: She is alert and oriented to [place], place, and time.  Psychiatric:        Mood and Affect: Mood normal.     (all labs ordered are listed, but only abnormal results are displayed) Labs Reviewed  CBC WITH DIFFERENTIAL/PLATELET - Abnormal; Notable for the following components:      Result Value   WBC 14.7 (*)    Neutro Abs 12.4 (*)    All other components within normal limits  URINALYSIS, ROUTINE W REFLEX MICROSCOPIC - Abnormal; Notable for the following components:   APPearance HAZY (*)    Specific Gravity, Urine >1.046 (*)    Hgb urine dipstick SMALL (*)    Ketones, ur 5 (*)    All other components within normal limits  I-STAT CHEM 8, ED - Abnormal; Notable for the following components:   Glucose, Bld 119 (*)    TCO2 19 (*)    Hemoglobin 15.6 (*)    All other components within normal limits  HCG, SERUM, QUALITATIVE  COMPREHENSIVE METABOLIC PANEL WITH GFR  LIPASE, BLOOD    EKG: None  Radiology: CT ABDOMEN PELVIS W CONTRAST Result Date: 04/13/2024 EXAM: CT ABDOMEN AND PELVIS WITH CONTRAST 04/13/2024 02:55:54 PM TECHNIQUE: CT of the abdomen and pelvis was performed with the administration of 100 mL of iohexol  (OMNIPAQUE ) 350 MG/ML injection. Multiplanar reformatted images are provided for review. Automated exposure control, iterative reconstruction, and/or weight-based adjustment of the mA/kV was utilized to reduce the radiation dose to as low as reasonably achievable. COMPARISON: 04/06/2024 CLINICAL HISTORY: Abdominal pain, acute, nonlocalized; RLQ abdominal pain. FINDINGS: LOWER CHEST: Right breast prosthesis partially visualized. LIVER: Stable scattered hepatic cysts, largest 1.4 cm in the lateral left hepatic lobe. GALLBLADDER AND BILE DUCTS: Gallbladder is unremarkable. No biliary ductal dilatation. SPLEEN: No acute  abnormality. PANCREAS: No acute abnormality. ADRENAL GLANDS: No acute abnormality. KIDNEYS, URETERS AND BLADDER: 12 mm obstructing calculus at the right UPJ with moderate hydronephrosis. Mild inflammatory/edema changes around the right kidney in the retroperitoneum. No other urolithiasis. Urinary bladder incompletely distended. GI AND BOWEL: Stable Small paraumbilical hernia containing only mesenteric fat. Stomach demonstrates no acute abnormality. There is no bowel obstruction. PERITONEUM AND RETROPERITONEUM: No ascites. No free air. VASCULATURE: Aorta is normal in caliber. LYMPH NODES: No lymphadenopathy. REPRODUCTIVE ORGANS: IUD in place. BONES AND SOFT TISSUES: No acute osseous abnormality. No focal soft tissue abnormality. IMPRESSION: 1. Obstructing 12 mm right UPJ calculus with moderate hydronephrosis and mild perinephric inflammatory changes. Electronically signed by: Dayne Hassell MD 04/13/2024 03:31 PM EST RP Workstation: [id number]     Procedures  Medications Ordered in the ED  0.9 %  sodium chloride  infusion (has no administration in time range)  sodium chloride  0.9 % bolus 1,000 mL (has no administration in time range)  ondansetron  (ZOFRAN ) injection 4 mg (has no administration in time range)  HYDROmorphone  (DILAUDID ) injection 1 mg (has no administration in time range)  oxyCODONE -acetaminophen  (PERCOCET/ROXICET) 5-325 MG per tablet 2 tablet (2 tablets Oral Given 04/13/24 1337)  ondansetron  (ZOFRAN -ODT) disintegrating tablet 4 mg (4 mg Oral Given 04/13/24 1338)  iohexol  (OMNIPAQUE ) 350 MG/ML injection 100 mL (100 mLs Intravenous Contrast Given 04/13/24 1456)                                    Medical Decision Making Risk Prescription drug management.   CT scan shows 12 mm right UPJ calculus mild hydronephrosis mild perinephric inflammatory changes.  This should be the cause of her pain.  White count 14.7 hemoglobin 14.8 pregnancy test negative.  I-STAT renal function was normal on  that.  Electrolytes normal.  Patient has complete metabolic panel lipase pending.  Urinalysis was hazy RBCs 21-50 white blood cells 0-5 bacteria none.  All consistent with the ureteral stone.  Because of the size will run it past on-call urology.  Will give IV fluids will give pain medicine.  Discussed with Dr. Carolee.  Thinking that she is probably on the lithotripsy.  Wants her to try to call to set up the appointment now.  And if she needs to be seen over the weekend to go to [facility], ED.  Because they will may have to admit her.  Final diagnoses:  Right ureteral stone    ED Discharge Orders     None          Geraldene Hamilton, MD 04/13/24 1600    Geraldene Hamilton, MD 04/13/24 1606

## 2024-04-13 NOTE — ED Notes (Signed)
 Unable to obtain X2

## 2024-04-13 NOTE — Discharge Instructions (Addendum)
 The Zofran  and ODT as needed for the nausea vomiting.  Will take that around-the-clock.  Take the Motrin  800 mg every 8 hours.  Supplement with the oxycodone  as needed for pain.  Follow-up with [facility] as arranged on Monday.  If you need to get seen over the weekend go to [facility], ED as per request of urology.

## 2024-04-13 NOTE — ED Notes (Signed)
 Awaiting pt from lobby

## 2024-04-13 NOTE — ED Notes (Signed)
 Awaiting patient from lobby.

## 2024-04-13 NOTE — ED Triage Notes (Signed)
 Patient states that she his having RLQ pain that has been off and on for the past couple weeks. She states that is has become unbearable now. Report she is having nausea and vomiting, and chills. Denies fever and diarrhea.

## 2024-04-13 NOTE — ED Provider Triage Note (Signed)
 Emergency Medicine Provider Triage Evaluation Note  Karen Hopkins , a 48 y.o. female  was evaluated in triage.  Pt complains of right lower quadrant abdominal pain severe since 2:00 in the morning with episodes of vomiting.  She has history of similar symptoms but states has never been like this before.  She has an IUD and does not get regular menstrual periods.  She denies urinary or vaginal symptoms.  Pain does not radiate.  Review of Systems  Positive: Right lower quadrant abdominal pain Negative: Fever  Physical Exam  BP (!) 155/105 (BP Location: Right Arm)   Pulse (!) 107   Temp 97.8 F (36.6 C)   Resp 17   Ht 5' 4 (1.626 m)   Wt 115.7 kg   SpO2 98%   BMI 43.77 kg/m  Gen:   Awake, tearful Resp:  Normal effort  MSK:   Moves extremities without difficulty  Other:  Tenderness to the right lower quadrant  Medical Decision Making  Medically screening exam initiated at 1:20 PM.  Appropriate orders placed.  Karen Hopkins was informed that the remainder of the evaluation will be completed by another provider, this initial triage assessment does not replace that evaluation, and the importance of remaining in the ED until their evaluation is complete.  Orders placed   Arloa Chroman, PA-C 04/13/24 1321

## 2024-04-16 MED ORDER — OZEMPIC (1 MG/DOSE) 4 MG/3ML ~~LOC~~ SOPN: 1.0000 mg | PEN_INJECTOR | SUBCUTANEOUS | 0 refills | Status: DC

## 2024-04-16 NOTE — Telephone Encounter (Signed)
 Have not heard back from patient so just sending in 1mg  dose. Karen JINNY Legions, MD

## 2024-04-17 ENCOUNTER — Ambulatory Visit: Admitting: Urology

## 2024-04-18 MED ORDER — OZEMPIC (2 MG/DOSE) 8 MG/3ML ~~LOC~~ SOPN: 2.0000 mg | PEN_INJECTOR | SUBCUTANEOUS | 3 refills | Status: AC

## 2024-04-25 LAB — OPHTHALMOLOGY REPORT-SCANNED

## 2024-04-30 ENCOUNTER — Ambulatory Visit (INDEPENDENT_AMBULATORY_CARE_PROVIDER_SITE_OTHER): Admitting: Family Medicine

## 2024-04-30 ENCOUNTER — Encounter: Payer: Self-pay | Admitting: Family Medicine

## 2024-04-30 DIAGNOSIS — Z0000 Encounter for general adult medical examination without abnormal findings: Secondary | ICD-10-CM | POA: Diagnosis not present

## 2024-04-30 DIAGNOSIS — Z23 Encounter for immunization: Secondary | ICD-10-CM | POA: Diagnosis not present

## 2024-04-30 DIAGNOSIS — R7303 Prediabetes: Secondary | ICD-10-CM

## 2024-04-30 DIAGNOSIS — Z8639 Personal history of other endocrine, nutritional and metabolic disease: Secondary | ICD-10-CM

## 2024-04-30 DIAGNOSIS — N200 Calculus of kidney: Secondary | ICD-10-CM

## 2024-04-30 LAB — POCT GLYCOSYLATED HEMOGLOBIN (HGB A1C): Hemoglobin A1C: 5.6 % (ref 4.0–5.6)

## 2024-04-30 NOTE — Progress Notes (Unsigned)
  Date of Visit: 04/30/2024   SUBJECTIVE:   HPI:  Discussed the use of AI scribe software for clinical note transcription with the patient, who gave verbal consent to proceed.  History of Present Illness Karen Hopkins is a 48 year old female who presents for a follow up of obesity and a kidney stone.  Obesity/history of diabetes - Ozempic  dose recently increased to 2 mg weekly - Decrease in appetite and weight loss since dosage adjustment, but overall tolerating well   Renal colic and nephrolithiasis - Severe pain due to kidney stone, described as the worst pain she has ever experienced, exceeding pain from prior hernia surgery - Pain recurs and requires continuous use of pain medications - Kidney stone measures approximately 11-12 millimeters and is unlikely to pass spontaneously, seen in ED for this on 11/7 - Upcoming urology appointment scheduled to discuss potential surgical intervention    OBJECTIVE:   BP 110/78   Pulse 91   Ht 5' 4 (1.626 m)   Wt 250 lb 3.2 oz (113.5 kg)   SpO2 98%   BMI 42.95 kg/m  Gen: no acute distress, pleasant cooperative  HEENT: normocephalic, atraumatic  Heart: regular rate and rhythm, no murmur Lungs: clear to auscultation bilaterally, normal work of breathing  Neuro: alert grossly nonfocal speech normal Ext: No appreciable lower extremity edema bilaterally   ASSESSMENT/PLAN:   Assessment & Plan Prediabetes A1c 5.6, excellent control. On Ozempic  2 mg weekly with well-tolerated results, decreased appetite, and 5-pound weight loss. - Continue Ozempic  2 mg weekly. - Follow-up in 3 months to monitor weight and medication efficacy. Morbid obesity (HCC) On Ozempic  2 mg weekly with well-tolerated results, decreased appetite, and 5-pound weight loss. - Continue Ozempic  2 mg weekly. - Follow-up in 3 months to monitor weight and medication efficacy. Routine adult health maintenance UACR today Flu shot & HBV #2 given today Will get Tdap at  her pharmacy Nephrolithiasis - Continue oxycodone  and Motrin  as needed for pain. - Follow up with urology for surgical evaluation and management.    FOLLOW UP: Follow up in 3 mos for obesity/prediabetes  Karen Ruppe J. Donah, MD [facility]

## 2024-04-30 NOTE — Patient Instructions (Addendum)
 It was great to see you again today.  Checking urine today for protein levels A1c today is 5.6 - this is excellent  Continue ozempic  2mg  weekly Follow up in 3 months   Get your Tdap vaccine at your pharmacy Flu and hepatitis B vaccines today  Be well, Dr. Donah

## 2024-05-01 ENCOUNTER — Other Ambulatory Visit: Payer: Self-pay | Admitting: Urology

## 2024-05-01 ENCOUNTER — Ambulatory Visit (INDEPENDENT_AMBULATORY_CARE_PROVIDER_SITE_OTHER): Admitting: Urology

## 2024-05-01 ENCOUNTER — Encounter: Payer: Self-pay | Admitting: Urology

## 2024-05-01 ENCOUNTER — Ambulatory Visit: Payer: Self-pay | Admitting: Urology

## 2024-05-01 ENCOUNTER — Ambulatory Visit (HOSPITAL_BASED_OUTPATIENT_CLINIC_OR_DEPARTMENT_OTHER)
Admission: RE | Admit: 2024-05-01 | Discharge: 2024-05-01 | Disposition: A | Discharge status: Home / Self Care | Source: Ambulatory Visit | Attending: Urology | Admitting: Urology

## 2024-05-01 VITALS — BP 131/83 | HR 101 | Ht 64.0 in | Wt 250.0 lb

## 2024-05-01 DIAGNOSIS — N200 Calculus of kidney: Secondary | ICD-10-CM

## 2024-05-01 DIAGNOSIS — R1031 Right lower quadrant pain: Secondary | ICD-10-CM

## 2024-05-01 LAB — MICROSCOPIC EXAMINATION

## 2024-05-01 LAB — URINALYSIS, ROUTINE W REFLEX MICROSCOPIC
Glucose, UA: NEGATIVE
Leukocytes,UA: NEGATIVE
Nitrite, UA: NEGATIVE
Protein,UA: NEGATIVE
Specific Gravity, UA: 1.025 (ref 1.005–1.030)
Urobilinogen, Ur: 0.2 mg/dL (ref 0.2–1.0)
pH, UA: 5 (ref 5.0–7.5)

## 2024-05-01 LAB — MICROALBUMIN / CREATININE URINE RATIO
Creatinine, Urine: 185 mg/dL
Microalb/Creat Ratio: 7 mg/g{creat} (ref 0–29)
Microalbumin, Urine: 12.7 ug/mL

## 2024-05-01 MED ORDER — OXYCODONE HCL 5 MG PO TABS: 2.5000 mg | ORAL_TABLET | Freq: Four times a day (QID) | ORAL | 0 refills | Status: DC | PRN

## 2024-05-01 NOTE — H&P (View-Only) (Signed)
 Assessment: 1. Nephrolithiasis   2. Right lower quadrant abdominal pain     Plan: I personally reviewed the CT studies from 10/31 and 04/13/2024. KUB reviewed today with results as noted below. Options for management of the right renal calculus discussed including shockwave lithotripsy and ureteroscopic laser lithotripsy.  Risk and benefits of each treatment reviewed.  Following our discussion, she would like to proceed with right shockwave lithotripsy. Refill of pain medicine provided.  Procedure: The patient will be scheduled for right ESWL at [facility].  Surgical request is placed with the surgery schedulers and will be scheduled at the patient's/family request. Informed consent is given as documented below. Anesthesia: local  The patient does not have sleep apnea, history of MRSA, history of VRE, history of cardiac device requiring special anesthetic needs. Patient is stable and considered clear for surgical management in an outpatient ambulatory surgery setting as well as inpatient hospital setting.  Consent for Operation or Procedure: Provider Certification I hereby certify that the nature, purpose, benefits, usual and most frequent risks of, and alternatives to, the operation or procedure have been explained to the patient (or Dulude authorized to sign for the patient) either by me as responsible physician or by the provider who is to perform the operation or procedure. Time spent such that the patient/family has had an opportunity to ask questions, and that those questions have been answered. The patient or the patient's representative has been advised that selected tasks may be performed by assistants to the primary health care provider(s). I believe that the patient (or Lecount authorized to sign for the patient) understands what has been explained, and has consented to the operation or procedure. No guarantees were implied or made.   Chief Complaint:  Chief Complaint  Patient  presents with   Nephrolithiasis    History of Present Illness:  Karen Hopkins is a 48 y.o. female who is seen for further evaluation of nephrolithiasis. At her initial visit in June 2024, she had been evaluated for abdominal pain and possible hernia. CT imaging showed a punctate nonobstructing left renal calculus and an 8 mm inferior pole right renal calculus without obstruction. No prior history of kidney stones.  No flank pain.  No dysuria or gross hematuria.  No recent UTIs.  CT imaging from 6/20 showed a 5 mm calculus in the inferior pole of the right kidney.  At her visit in March 2025, she was not having any flank pain or lower urinary tract symptoms.  No dysuria or gross hematuria. KUB showed a 7 mm calcification in the lower right renal shadow.  She was seen on 03/30/24 for evaluation of right sided abdominal and flank pain.  She reported onset of symptoms approximately 1 week prior.  She had pain in the right lower quadrant and in the right flank area.  No lower urinary tract symptoms.  No dysuria or gross hematuria.  No nausea or vomiting. KUB from 03/30/2024 showed a 7-8 mm calcification in the right lower renal shadow. She was evaluated further with a CT renal stone study on 04/07/2024.  This showed a 9 mm calculus in the right renal pelvis without hydronephrosis and a punctate nonobstructing calculus seen in the lower pole of the left kidney. She developed worsening right sided flank and lower quadrant pain on 04/13/2024.  She was seen in the emergency room for her symptoms.  CT imaging at that time showed a 12 mm obstructing calculus at the right UPJ with moderate hydronephrosis. She  has had some intermittent pain since that time.  She has taken pain medicine with relief.  No fevers or chills.  No nausea or vomiting.  No gross hematuria or dysuria.   Portions of the above documentation were copied from a prior visit for review purposes only.   Past Medical History:  Past  Medical History:  Diagnosis Date   Anxiety    Arthritis    Cancer (HCC) 2011   Rt. Br. Ca   Depression    Diabetes mellitus without complication (HCC)    has been borderline   Essential hypertension 11/05/2015   H. pylori infection    History of radiation therapy    Hypertension    under control with med., has been on med. x 2 yr.   Lymphedema of arm    right; no BP or puncture to right arm   Personal history of chemotherapy 09/16/2009   rt breast   Pneumonia    Seasonal allergies    Sinus headache     Past Surgical History:  Past Surgical History:  Procedure Laterality Date   BREAST BIOPSY Right 08/26/2009   BREAST REDUCTION SURGERY Left 08/23/2013   Procedure: LEFT BREAST REDUCTION  ;  Surgeon: Estefana Reichert, DO;  Location: [facility];  Service: Plastics;  Laterality: Left;   BREAST REDUCTION WITH MASTOPEXY Left 08/22/2019   Procedure: Left breast mastopexy reduction with liposuction for breast symmetry;  Surgeon: Lowery Estefana RAMAN, DO;  Location: [facility];  Service: Plastics;  Laterality: Left;   CESAREAN SECTION  07/15/2001; 03/18/2004; 11/21/2008   lateral orbiotomy Left 11/2015   Dr. Melba at [facility]   LATISSIMUS FLAP TO BREAST Right 03/12/2013   Procedure: RIGHT BREAST LATISSIMUS FLAP WITH EXPANDER PLACEMENT;  Surgeon: Estefana Reichert, DO;  Location: [facility];  Service: Plastics;  Laterality: Right;   LIPOSUCTION Bilateral 08/23/2013   Procedure: LIPOSUCTION;  Surgeon: Estefana Reichert, DO;  Location: [facility];  Service: Plastics;  Laterality: Bilateral;   LIPOSUCTION WITH LIPOFILLING Left 08/22/2019   Procedure: LIPOSUCTION WITH LIPOFILLING;  Surgeon: Lowery Estefana RAMAN, DO;  Location: [facility];  Service: Plastics;  Laterality: Left;   MASTECTOMY Right 2011   MODIFIED RADICAL MASTECTOMY Right 03/11/2010   NASAL SEPTUM SURGERY     ORIF ANKLE FRACTURE Left 07/18/2018   Procedure: OPEN REDUCTION INTERNAL  FIXATION (ORIF) LEFT ANKLE FRACTURE WITH  SYNDESMOSIS AND ANKLE ARTHROTOMY;  Surgeon: Elsa Lonni SAUNDERS, MD;  Location: [facility];  Service: Orthopedics;  Laterality: Left;   PORT-A-CATH REMOVAL Left 12/01/2010   PORTACATH PLACEMENT Left 09/12/2009   REDUCTION MAMMAPLASTY Left 08/2013   REMOVAL OF TISSUE EXPANDER AND PLACEMENT OF IMPLANT Right 08/23/2013   Procedure: REMOVAL RIGHT TISSUE EXPANDER AND PLACEMENT OF IMPLANT TO RIGHT BREAST ;  Surgeon: Estefana Reichert, DO;  Location: [facility];  Service: Plastics;  Laterality: Right;   SUPRA-UMBILICAL HERNIA  2006   TUBAL LIGATION  11/21/2008   UMBILICAL HERNIA REPAIR  04/12/2022   CCS DR    Allergies:  Allergies  Allergen Reactions   Penicillins Swelling    FACIAL SWELLING Did it involve swelling of the face/tongue/throat, SOB, or low BP? Yes Did it involve sudden or severe rash/hives, skin peeling, or any reaction on the inside of your mouth or nose? Unknown Did you need to seek medical attention at a hospital or doctor's office? No When did it last happen? More than 10 years If all above answers are NO,  may proceed with cephalosporin use.    Lyrica  [Pregabalin ] Nausea Only    .   Meloxicam  Nausea Only   Robaxin  [Methocarbamol ] Nausea Only    Family History:  Family History  Problem Relation Age of Onset   Hypertension Mother    Breast cancer Mother 94   Diabetes type II Father    Prostate cancer Father 23   Stroke Neg Hx    Colon polyps Neg Hx    Esophageal cancer Neg Hx    Pancreatic cancer Neg Hx    Stomach cancer Neg Hx    Rectal cancer Neg Hx     Social History:  Social History   Tobacco Use   Smoking status: Former    Current packs/day: 0.00    Types: Cigarettes    Start date: 06/07/2002    Quit date: 05/08/2019    Years since quitting: 4.9   Smokeless tobacco: Never  Vaping Use   Vaping status: Never Used  Substance Use Topics   Alcohol  use: No   Drug use: No     ROS: Constitutional:  Negative for fever, chills, weight loss CV: Negative for chest pain, previous MI, hypertension Respiratory:  Negative for shortness of breath, wheezing, sleep apnea, frequent cough GI:  Negative for nausea, vomiting, bloody stool, GERD  Physical exam: BP 131/83   Pulse (!) 101   Ht 5' 4 (1.626 m)   Wt 250 lb (113.4 kg)   BMI 42.91 kg/m  GENERAL APPEARANCE:  Well appearing, well developed, well nourished, NAD HEENT:  Atraumatic, normocephalic, oropharynx clear NECK:  Supple without lymphadenopathy or thyromegaly ABDOMEN:  Soft, non-tender, no masses EXTREMITIES:  Moves all extremities well, without clubbing, cyanosis, or edema NEUROLOGIC:  Alert and oriented x 3, normal gait, CN II-XII grossly intact MENTAL STATUS:  appropriate BACK:  Non-tender to palpation, No CVAT SKIN:  Warm, dry, and intact  Results: U/A: 0-5 WBCs, 3-10 RBCs  KUB: 8 x 10 mm calcification in the mid right renal shadow consistent with the known right renal calculus.

## 2024-05-01 NOTE — Progress Notes (Signed)
 Assessment: 1. Nephrolithiasis   2. Right lower quadrant abdominal pain     Plan: I personally reviewed the CT studies from 10/31 and 04/13/2024. KUB reviewed today with results as noted below. Options for management of the right renal calculus discussed including shockwave lithotripsy and ureteroscopic laser lithotripsy.  Risk and benefits of each treatment reviewed.  Following our discussion, she would like to proceed with right shockwave lithotripsy. Refill of pain medicine provided.  Procedure: The patient will be scheduled for right ESWL at [facility].  Surgical request is placed with the surgery schedulers and will be scheduled at the patient's/family request. Informed consent is given as documented below. Anesthesia: local  The patient does not have sleep apnea, history of MRSA, history of VRE, history of cardiac device requiring special anesthetic needs. Patient is stable and considered clear for surgical management in an outpatient ambulatory surgery setting as well as inpatient hospital setting.  Consent for Operation or Procedure: Provider Certification I hereby certify that the nature, purpose, benefits, usual and most frequent risks of, and alternatives to, the operation or procedure have been explained to the patient (or Dulude authorized to sign for the patient) either by me as responsible physician or by the provider who is to perform the operation or procedure. Time spent such that the patient/family has had an opportunity to ask questions, and that those questions have been answered. The patient or the patient's representative has been advised that selected tasks may be performed by assistants to the primary health care provider(s). I believe that the patient (or Lecount authorized to sign for the patient) understands what has been explained, and has consented to the operation or procedure. No guarantees were implied or made.   Chief Complaint:  Chief Complaint  Patient  presents with   Nephrolithiasis    History of Present Illness:  Karen Hopkins is a 48 y.o. female who is seen for further evaluation of nephrolithiasis. At her initial visit in June 2024, she had been evaluated for abdominal pain and possible hernia. CT imaging showed a punctate nonobstructing left renal calculus and an 8 mm inferior pole right renal calculus without obstruction. No prior history of kidney stones.  No flank pain.  No dysuria or gross hematuria.  No recent UTIs.  CT imaging from 6/20 showed a 5 mm calculus in the inferior pole of the right kidney.  At her visit in March 2025, she was not having any flank pain or lower urinary tract symptoms.  No dysuria or gross hematuria. KUB showed a 7 mm calcification in the lower right renal shadow.  She was seen on 03/30/24 for evaluation of right sided abdominal and flank pain.  She reported onset of symptoms approximately 1 week prior.  She had pain in the right lower quadrant and in the right flank area.  No lower urinary tract symptoms.  No dysuria or gross hematuria.  No nausea or vomiting. KUB from 03/30/2024 showed a 7-8 mm calcification in the right lower renal shadow. She was evaluated further with a CT renal stone study on 04/07/2024.  This showed a 9 mm calculus in the right renal pelvis without hydronephrosis and a punctate nonobstructing calculus seen in the lower pole of the left kidney. She developed worsening right sided flank and lower quadrant pain on 04/13/2024.  She was seen in the emergency room for her symptoms.  CT imaging at that time showed a 12 mm obstructing calculus at the right UPJ with moderate hydronephrosis. She  has had some intermittent pain since that time.  She has taken pain medicine with relief.  No fevers or chills.  No nausea or vomiting.  No gross hematuria or dysuria.   Portions of the above documentation were copied from a prior visit for review purposes only.   Past Medical History:  Past  Medical History:  Diagnosis Date   Anxiety    Arthritis    Cancer (HCC) 2011   Rt. Br. Ca   Depression    Diabetes mellitus without complication (HCC)    has been borderline   Essential hypertension 11/05/2015   H. pylori infection    History of radiation therapy    Hypertension    under control with med., has been on med. x 2 yr.   Lymphedema of arm    right; no BP or puncture to right arm   Personal history of chemotherapy 09/16/2009   rt breast   Pneumonia    Seasonal allergies    Sinus headache     Past Surgical History:  Past Surgical History:  Procedure Laterality Date   BREAST BIOPSY Right 08/26/2009   BREAST REDUCTION SURGERY Left 08/23/2013   Procedure: LEFT BREAST REDUCTION  ;  Surgeon: Estefana Reichert, DO;  Location: [facility];  Service: Plastics;  Laterality: Left;   BREAST REDUCTION WITH MASTOPEXY Left 08/22/2019   Procedure: Left breast mastopexy reduction with liposuction for breast symmetry;  Surgeon: Lowery Estefana RAMAN, DO;  Location: [facility];  Service: Plastics;  Laterality: Left;   CESAREAN SECTION  07/15/2001; 03/18/2004; 11/21/2008   lateral orbiotomy Left 11/2015   Dr. Melba at [facility]   LATISSIMUS FLAP TO BREAST Right 03/12/2013   Procedure: RIGHT BREAST LATISSIMUS FLAP WITH EXPANDER PLACEMENT;  Surgeon: Estefana Reichert, DO;  Location: [facility];  Service: Plastics;  Laterality: Right;   LIPOSUCTION Bilateral 08/23/2013   Procedure: LIPOSUCTION;  Surgeon: Estefana Reichert, DO;  Location: [facility];  Service: Plastics;  Laterality: Bilateral;   LIPOSUCTION WITH LIPOFILLING Left 08/22/2019   Procedure: LIPOSUCTION WITH LIPOFILLING;  Surgeon: Lowery Estefana RAMAN, DO;  Location: [facility];  Service: Plastics;  Laterality: Left;   MASTECTOMY Right 2011   MODIFIED RADICAL MASTECTOMY Right 03/11/2010   NASAL SEPTUM SURGERY     ORIF ANKLE FRACTURE Left 07/18/2018   Procedure: OPEN REDUCTION INTERNAL  FIXATION (ORIF) LEFT ANKLE FRACTURE WITH  SYNDESMOSIS AND ANKLE ARTHROTOMY;  Surgeon: Elsa Lonni SAUNDERS, MD;  Location: [facility];  Service: Orthopedics;  Laterality: Left;   PORT-A-CATH REMOVAL Left 12/01/2010   PORTACATH PLACEMENT Left 09/12/2009   REDUCTION MAMMAPLASTY Left 08/2013   REMOVAL OF TISSUE EXPANDER AND PLACEMENT OF IMPLANT Right 08/23/2013   Procedure: REMOVAL RIGHT TISSUE EXPANDER AND PLACEMENT OF IMPLANT TO RIGHT BREAST ;  Surgeon: Estefana Reichert, DO;  Location: [facility];  Service: Plastics;  Laterality: Right;   SUPRA-UMBILICAL HERNIA  2006   TUBAL LIGATION  11/21/2008   UMBILICAL HERNIA REPAIR  04/12/2022   CCS DR    Allergies:  Allergies  Allergen Reactions   Penicillins Swelling    FACIAL SWELLING Did it involve swelling of the face/tongue/throat, SOB, or low BP? Yes Did it involve sudden or severe rash/hives, skin peeling, or any reaction on the inside of your mouth or nose? Unknown Did you need to seek medical attention at a hospital or doctor's office? No When did it last happen? More than 10 years If all above answers are NO,  may proceed with cephalosporin use.    Lyrica  [Pregabalin ] Nausea Only    .   Meloxicam  Nausea Only   Robaxin  [Methocarbamol ] Nausea Only    Family History:  Family History  Problem Relation Age of Onset   Hypertension Mother    Breast cancer Mother 94   Diabetes type II Father    Prostate cancer Father 23   Stroke Neg Hx    Colon polyps Neg Hx    Esophageal cancer Neg Hx    Pancreatic cancer Neg Hx    Stomach cancer Neg Hx    Rectal cancer Neg Hx     Social History:  Social History   Tobacco Use   Smoking status: Former    Current packs/day: 0.00    Types: Cigarettes    Start date: 06/07/2002    Quit date: 05/08/2019    Years since quitting: 4.9   Smokeless tobacco: Never  Vaping Use   Vaping status: Never Used  Substance Use Topics   Alcohol  use: No   Drug use: No     ROS: Constitutional:  Negative for fever, chills, weight loss CV: Negative for chest pain, previous MI, hypertension Respiratory:  Negative for shortness of breath, wheezing, sleep apnea, frequent cough GI:  Negative for nausea, vomiting, bloody stool, GERD  Physical exam: BP 131/83   Pulse (!) 101   Ht 5' 4 (1.626 m)   Wt 250 lb (113.4 kg)   BMI 42.91 kg/m  GENERAL APPEARANCE:  Well appearing, well developed, well nourished, NAD HEENT:  Atraumatic, normocephalic, oropharynx clear NECK:  Supple without lymphadenopathy or thyromegaly ABDOMEN:  Soft, non-tender, no masses EXTREMITIES:  Moves all extremities well, without clubbing, cyanosis, or edema NEUROLOGIC:  Alert and oriented x 3, normal gait, CN II-XII grossly intact MENTAL STATUS:  appropriate BACK:  Non-tender to palpation, No CVAT SKIN:  Warm, dry, and intact  Results: U/A: 0-5 WBCs, 3-10 RBCs  KUB: 8 x 10 mm calcification in the mid right renal shadow consistent with the known right renal calculus.

## 2024-05-02 ENCOUNTER — Ambulatory Visit: Payer: Self-pay | Admitting: Family Medicine

## 2024-05-02 NOTE — Assessment & Plan Note (Signed)
 UACR today Flu shot & HBV #2 given today Will get Tdap at her pharmacy

## 2024-05-02 NOTE — Assessment & Plan Note (Signed)
 On Ozempic  2 mg weekly with well-tolerated results, decreased appetite, and 5-pound weight loss. - Continue Ozempic  2 mg weekly. - Follow-up in 3 months to monitor weight and medication efficacy.

## 2024-05-02 NOTE — Assessment & Plan Note (Signed)
-   Continue oxycodone  and Motrin  as needed for pain. - Follow up with urology for surgical evaluation and management.

## 2024-05-04 ENCOUNTER — Encounter (HOSPITAL_COMMUNITY): Payer: Self-pay | Admitting: Urology

## 2024-05-04 NOTE — Progress Notes (Signed)
 Attempted to obtain medical history for pre op call via telephone, unable to reach at this time. HIPAA compliant voicemail message left requesting return call to pre surgical testing department.

## 2024-05-07 ENCOUNTER — Encounter (HOSPITAL_COMMUNITY)
Admission: RE | Disposition: A | Discharge status: Home / Self Care | Payer: Self-pay | Source: Home / Self Care | Attending: Urology

## 2024-05-07 ENCOUNTER — Other Ambulatory Visit: Payer: Self-pay

## 2024-05-07 ENCOUNTER — Ambulatory Visit (HOSPITAL_COMMUNITY)
Admission: RE | Admit: 2024-05-07 | Discharge: 2024-05-07 | Disposition: A | Discharge status: Home / Self Care | Attending: Urology | Admitting: Urology

## 2024-05-07 ENCOUNTER — Ambulatory Visit (HOSPITAL_COMMUNITY)

## 2024-05-07 ENCOUNTER — Encounter (HOSPITAL_COMMUNITY): Payer: Self-pay | Admitting: Urology

## 2024-05-07 DIAGNOSIS — N200 Calculus of kidney: Secondary | ICD-10-CM

## 2024-05-07 HISTORY — PX: EXTRACORPOREAL SHOCK WAVE LITHOTRIPSY: SHX1557

## 2024-05-07 SURGERY — LITHOTRIPSY, ESWL
Anesthesia: LOCAL | Laterality: Right

## 2024-05-07 MED ORDER — TAMSULOSIN HCL 0.4 MG PO CAPS: 0.4000 mg | ORAL_CAPSULE | Freq: Every day | ORAL | 0 refills | Status: DC

## 2024-05-07 MED ORDER — KETOROLAC TROMETHAMINE 30 MG/ML IJ SOLN
INTRAMUSCULAR | Status: DC
  Filled 2024-05-07: qty 1

## 2024-05-07 MED ORDER — DIAZEPAM 5 MG PO TABS
10.0000 mg | ORAL_TABLET | ORAL | Status: AC
  Administered 2024-05-07: 10 mg via ORAL
  Filled 2024-05-07: qty 2

## 2024-05-07 MED ORDER — OXYCODONE HCL 5 MG PO TABS: 2.5000 mg | ORAL_TABLET | Freq: Four times a day (QID) | ORAL | 0 refills | Status: DC | PRN

## 2024-05-07 MED ORDER — KETOROLAC TROMETHAMINE 30 MG/ML IJ SOLN
30.0000 mg | Freq: Once | INTRAMUSCULAR | Status: AC
  Administered 2024-05-07: 30 mg via INTRAVENOUS

## 2024-05-07 MED ORDER — DIPHENHYDRAMINE HCL 25 MG PO CAPS
25.0000 mg | ORAL_CAPSULE | ORAL | Status: AC
  Administered 2024-05-07: 25 mg via ORAL
  Filled 2024-05-07: qty 1

## 2024-05-07 MED ORDER — CIPROFLOXACIN HCL 500 MG PO TABS
500.0000 mg | ORAL_TABLET | ORAL | Status: AC
  Administered 2024-05-07: 500 mg via ORAL
  Filled 2024-05-07: qty 1

## 2024-05-07 NOTE — Op Note (Signed)
 Operative Note  Procedure:  right shock wave lithotripsy  Please see Operative Note from [facility] scanned into chart.  Adine DOROTHA Manly, MD [facility] Urology [facility] [phone]

## 2024-05-07 NOTE — Interval H&P Note (Signed)
 History and Physical Interval Note:  05/07/2024 1:23 PM  Karen Hopkins  has presented today for surgery, with the diagnosis of Kidney stone.  The various methods of treatment have been discussed with the patient and family. After consideration of risks, benefits and other options for treatment, the patient has consented to  Procedure(s): LITHOTRIPSY, ESWL (Right) as a surgical intervention.  The patient's history has been reviewed, patient examined, no change in status, stable for surgery.  I have reviewed the patient's chart and labs.  Questions were answered to the patient's satisfaction.     [name]

## 2024-05-08 ENCOUNTER — Emergency Department (HOSPITAL_COMMUNITY)

## 2024-05-08 ENCOUNTER — Telehealth: Payer: Self-pay

## 2024-05-08 ENCOUNTER — Emergency Department (HOSPITAL_COMMUNITY)
Admission: EM | Admit: 2024-05-08 | Discharge: 2024-05-08 | Disposition: A | Discharge status: Home / Self Care | Source: Ambulatory Visit

## 2024-05-08 ENCOUNTER — Encounter (HOSPITAL_COMMUNITY): Payer: Self-pay | Admitting: Emergency Medicine

## 2024-05-08 DIAGNOSIS — N200 Calculus of kidney: Secondary | ICD-10-CM

## 2024-05-08 DIAGNOSIS — N132 Hydronephrosis with renal and ureteral calculous obstruction: Secondary | ICD-10-CM | POA: Diagnosis not present

## 2024-05-08 DIAGNOSIS — G8918 Other acute postprocedural pain: Secondary | ICD-10-CM | POA: Insufficient documentation

## 2024-05-08 DIAGNOSIS — R109 Unspecified abdominal pain: Secondary | ICD-10-CM | POA: Diagnosis present

## 2024-05-08 LAB — CBC WITH DIFFERENTIAL/PLATELET
Abs Immature Granulocytes: 0.03 K/uL (ref 0.00–0.07)
Basophils Absolute: 0 K/uL (ref 0.0–0.1)
Basophils Relative: 0 %
Eosinophils Absolute: 0.3 K/uL (ref 0.0–0.5)
Eosinophils Relative: 2 %
HCT: 40.9 % (ref 36.0–46.0)
Hemoglobin: 13.1 g/dL (ref 12.0–15.0)
Immature Granulocytes: 0 %
Lymphocytes Relative: 14 %
Lymphs Abs: 1.7 K/uL (ref 0.7–4.0)
MCH: 29.7 pg (ref 26.0–34.0)
MCHC: 32 g/dL (ref 30.0–36.0)
MCV: 92.7 fL (ref 80.0–100.0)
Monocytes Absolute: 0.5 K/uL (ref 0.1–1.0)
Monocytes Relative: 4 %
Neutro Abs: 9.3 K/uL — ABNORMAL HIGH (ref 1.7–7.7)
Neutrophils Relative %: 80 %
Platelets: 245 K/uL (ref 150–400)
RBC: 4.41 MIL/uL (ref 3.87–5.11)
RDW: 13.5 % (ref 11.5–15.5)
WBC: 11.8 K/uL — ABNORMAL HIGH (ref 4.0–10.5)
nRBC: 0 % (ref 0.0–0.2)

## 2024-05-08 LAB — BASIC METABOLIC PANEL WITH GFR
Anion gap: 11 (ref 5–15)
BUN: 9 mg/dL (ref 6–20)
CO2: 21 mmol/L — ABNORMAL LOW (ref 22–32)
Calcium: 9.6 mg/dL (ref 8.9–10.3)
Chloride: 107 mmol/L (ref 98–111)
Creatinine, Ser: 0.73 mg/dL (ref 0.44–1.00)
GFR, Estimated: >60 mL/min (ref >60)
Glucose, Bld: 101 mg/dL — ABNORMAL HIGH (ref 70–99)
Potassium: 3.7 mmol/L (ref 3.5–5.1)
Sodium: 139 mmol/L (ref 135–145)

## 2024-05-08 LAB — POCT PREGNANCY, URINE: Preg Test, Ur: NEGATIVE

## 2024-05-08 MED ORDER — LACTATED RINGERS IV BOLUS
1000.0000 mL | Freq: Once | INTRAVENOUS | Status: AC
  Administered 2024-05-08: 1000 mL via INTRAVENOUS

## 2024-05-08 MED ORDER — LACTATED RINGERS IV BOLUS: 1000.0000 mL | Freq: Once | INTRAVENOUS | Status: DC

## 2024-05-08 MED ORDER — ONDANSETRON HCL 4 MG/2ML IJ SOLN
4.0000 mg | Freq: Once | INTRAMUSCULAR | Status: AC
  Administered 2024-05-08: 4 mg via INTRAVENOUS
  Filled 2024-05-08: qty 2

## 2024-05-08 MED ORDER — KETOROLAC TROMETHAMINE 15 MG/ML IJ SOLN
15.0000 mg | Freq: Once | INTRAMUSCULAR | Status: AC
  Administered 2024-05-08: 15 mg via INTRAVENOUS
  Filled 2024-05-08: qty 1

## 2024-05-08 MED ORDER — MORPHINE SULFATE (PF) 4 MG/ML IV SOLN
4.0000 mg | Freq: Once | INTRAVENOUS | Status: AC
  Administered 2024-05-08: 4 mg via INTRAVENOUS
  Filled 2024-05-08: qty 1

## 2024-05-08 MED ORDER — HYDROMORPHONE HCL 1 MG/ML IJ SOLN
1.0000 mg | Freq: Once | INTRAMUSCULAR | Status: AC
  Administered 2024-05-08: 1 mg via INTRAVENOUS
  Filled 2024-05-08: qty 1

## 2024-05-08 NOTE — Telephone Encounter (Signed)
 Pt called in tears stating she is not able to get any pain relief post ESWL. Pt stated the pain is in her right lower abd area, 9/10 pain scale. Pt states she is not able to lay down or walk around d/t pain. Pt states she can only stand still to get any form of relief. Pt stated she took oxycodone  5mg  at 8:00am and then took rx ibuprofen  800mg  at 8:30am without relief.   Spoke with Dr. Roseann who stated she would need to go to [facility] ER for IV pain meds and imaging. Dr. Roseann stated without the oral medications helping, there's not much that can be done in office. Pt voiced understanding stating she will go to ER now.

## 2024-05-08 NOTE — ED Provider Notes (Signed)
 [place] EMERGENCY DEPARTMENT AT [facility] Provider Note   CSN: [id number] Arrival date & time: 05/08/24  9088     Patient presents with: Vaginal Pain   Karen Hopkins is a 48 y.o. female.   48 year old female presents for evaluation of flank pain and abdominal pain.  Had a lithotripsy yesterday for kidney stones and has not been able to control her pain at home with ibuprofen  and oxycodone .  Called urology's office and they advised she come to the ER.  She denies any nausea or vomiting.  Denies any other symptoms or concerns at this time.   Vaginal Pain Associated symptoms include abdominal pain. Pertinent negatives include no chest pain and no shortness of breath.       Prior to Admission medications   Medication Sig Start Date End Date Taking? Authorizing Provider  cyclobenzaprine  (FLEXERIL ) 10 MG tablet Take 10 mg by mouth 2 (two) times daily as needed.    [provider]  fexofenadine  (ALLEGRA ) 180 MG tablet Take 1 tablet (180 mg total) by mouth daily. 10/24/23   Donah Laymon PARAS, MD  fluticasone  (FLONASE ) 50 MCG/ACT nasal spray SHAKE LIQUID AND USE 2 SPRAYS IN EACH NOSTRIL DAILY 11/28/23   Donah Laymon PARAS, MD  gabapentin  (NEURONTIN ) 100 MG capsule Take 200 mg by mouth 2 (two) times daily.    [provider]  ibuprofen  (ADVIL ) 800 MG tablet Take 1 tablet (800 mg total) by mouth 3 (three) times daily. 04/13/24   Zackowski, Scott, MD  levonorgestrel  (MIRENA ) 20 MCG/24HR IUD 1 each by Intrauterine route once.    [provider]  omeprazole  (PRILOSEC) 40 MG capsule Take 1 capsule (40 mg total) by mouth daily. NEED APPOINTMENT FOR ADDITIONAL REFILL 03/20/24   Legrand Victory LITTIE DOUGLAS, MD  ondansetron  (ZOFRAN -ODT) 4 MG disintegrating tablet Take 1 tablet (4 mg total) by mouth every 8 (eight) hours as needed. 04/13/24   Zackowski, Scott, MD  oxyCODONE  (ROXICODONE ) 5 MG immediate release tablet Take 0.5-1 tablets (2.5-5 mg total) by mouth every  6 (six) hours as needed for severe pain (pain score 7-10). 05/07/24   Stoneking, Adine PARAS., MD  polyethylene glycol powder (MIRALAX ) 17 GM/SCOOP powder Take 17 g by mouth daily as needed (constipation). 03/08/24   Donah Laymon PARAS, MD  Semaglutide , 2 MG/DOSE, (OZEMPIC , 2 MG/DOSE,) 8 MG/3ML SOPN Inject 2 mg into the skin once a week. 04/18/24   Donah Laymon PARAS, MD  tamsulosin (FLOMAX) 0.4 MG CAPS capsule Take 1 capsule (0.4 mg total) by mouth daily. 05/07/24   Stoneking, Adine PARAS., MD  Topiramate  (TOPAMAX  PO) Take 10 mg by mouth in the morning and at bedtime.    [provider]    Allergies: Penicillins, Lyrica  [pregabalin ], Meloxicam , and Robaxin  [methocarbamol ]    Review of Systems  Constitutional:  Negative for chills and fever.  HENT:  Negative for ear pain and sore throat.   Eyes:  Negative for pain and visual disturbance.  Respiratory:  Negative for cough and shortness of breath.   Cardiovascular:  Negative for chest pain and palpitations.  Gastrointestinal:  Positive for abdominal pain. Negative for vomiting.  Genitourinary:  Positive for vaginal pain. Negative for dysuria and hematuria.  Musculoskeletal:  Negative for arthralgias and back pain.  Skin:  Negative for color change and rash.  Neurological:  Negative for seizures and syncope.  All other systems reviewed and are negative.   Updated Vital Signs BP 128/84   Pulse 79   Temp 98.1 F (  36.7 C)   Resp 18   SpO2 100%   Physical Exam Vitals and nursing note reviewed.  Constitutional:      General: She is not in acute distress.    Appearance: Normal appearance. She is well-developed.     Comments: Appears uncomfortable, writhing in the bed  HENT:     Head: Normocephalic and atraumatic.  Eyes:     Conjunctiva/sclera: Conjunctivae normal.  Cardiovascular:     Rate and Rhythm: Normal rate and regular rhythm.     Heart sounds: No murmur heard. Pulmonary:     Effort: Pulmonary effort is normal. No  respiratory distress.     Breath sounds: Normal breath sounds.  Abdominal:     Palpations: Abdomen is soft.     Tenderness: There is abdominal tenderness.     Comments: Right lower quadrant right flank tenderness to palpation  Musculoskeletal:        General: No swelling.     Cervical back: Neck supple.  Skin:    General: Skin is warm and dry.     Capillary Refill: Capillary refill takes less than 2 seconds.  Neurological:     Mental Status: She is alert.  Psychiatric:        Mood and Affect: Mood normal.     (all labs ordered are listed, but only abnormal results are displayed) Labs Reviewed  BASIC METABOLIC PANEL WITH GFR - Abnormal; Notable for the following components:      Result Value   CO2 21 (*)    Glucose, Bld 101 (*)    All other components within normal limits  CBC WITH DIFFERENTIAL/PLATELET - Abnormal; Notable for the following components:   WBC 11.8 (*)    Neutro Abs 9.3 (*)    All other components within normal limits    EKG: None  Radiology: CT ABDOMEN PELVIS WO CONTRAST Result Date: 05/08/2024 CLINICAL DATA:  Right-sided abdominal pain, lithotripsy yesterday. EXAM: CT ABDOMEN AND PELVIS WITHOUT CONTRAST TECHNIQUE: Multidetector CT imaging of the abdomen and pelvis was performed following the standard protocol without IV contrast. RADIATION DOSE REDUCTION: This exam was performed according to the departmental dose-optimization program which includes automated exposure control, adjustment of the mA and/or kV according to patient size and/or use of iterative reconstruction technique. COMPARISON:  04/13/2024 FINDINGS: Lower chest: Heart is normal size.  Visualized lung bases are clear. Hepatobiliary: A couple small liver hypodensities unchanged likely cysts. Gallbladder and biliary tree are normal. Pancreas: Normal. Spleen: Normal. Adrenals/Urinary Tract: Adrenal glands are normal. Kidneys are normal size. No left-sided renal stones or ureteral stones. Patient is post  lithotripsy the large previously seen right UPJ stone. This stone is no longer seen as there are multiple small stone fragments within the right intrarenal collecting system with a few small stone fragments within the bladder. Right ureter is unremarkable. Persistent mild right-sided hydronephrosis. Stomach/Bowel: Stomach and small bowel are normal. Appendix is normal. Colon is normal. Vascular/Lymphatic: Abdominal aorta is normal in caliber. Remaining vascular structures are unremarkable. No adenopathy. Reproductive: Uterus just right of midline with IUD in adequate position unchanged. Adnexal regions unremarkable. Clip over the left adnexa likely from previous tubal ligation. Other: Stable umbilical hernia containing only peritoneal fat. No free fluid or focal inflammatory change. Musculoskeletal: No focal abnormality. IMPRESSION: 1. Evidence of recent lithotripsy with multiple small stone fragments within the right intrarenal collecting system and a few small stone fragments within the bladder. Persistent mild right-sided hydronephrosis. 2. Stable umbilical hernia containing only peritoneal fat.  3. A couple small liver hypodensities unchanged likely cysts. Electronically Signed   By: Toribio Agreste M.D.   On: 05/08/2024 13:01   DG Abd 1 View - KUB Result Date: 05/07/2024 CLINICAL DATA:  [id number] [place] in kidney [id number] EXAM: ABDOMEN - 1 VIEW COMPARISON:  May 01, 2024 FINDINGS: Nonobstructive bowel gas pattern.Cholecystectomy clips.No pneumoperitoneum. No organomegaly. Unchanged 9 mm density in the region of the right renal fossa. No acute fracture or destructive lesion. Intrauterine device in the pelvis. Left tubal ligation clip. IMPRESSION: 1. Similarly positioned 9 mm calculus in the region of the right renal fossa. 2. Nonobstructive bowel gas pattern. Electronically Signed   By: Rogelia Myers M.D.   On: 05/07/2024 14:21     Procedures   Medications Ordered in the ED  HYDROmorphone  (DILAUDID )  injection 1 mg (1 mg Intravenous Given 05/08/24 1130)  lactated ringers  bolus 1,000 mL (0 mLs Intravenous Stopped 05/08/24 1409)  ondansetron  (ZOFRAN ) injection 4 mg (4 mg Intravenous Given 05/08/24 1129)  morphine  (PF) 4 MG/ML injection 4 mg (4 mg Intravenous Given 05/08/24 1228)  ketorolac  (TORADOL ) 15 MG/ML injection 15 mg (15 mg Intravenous Given 05/08/24 1410)                                    Medical Decision Making Patient here for pain after lithotripsy.  Multiple kidney stones on her CT that are fairly small.  She was given morphine  Dilaudid  and Toradol  with improvement in her symptoms.  She did start to pass stone fragments while here in the ER.  I gave her some IV fluids as well.  She has oxycodone  from urology to take at home.  Advised Tylenol  Motrin  alternating every 3 hours as needed for pain close follow-up with urology.  She feels comfortable with plan to be discharged.  Problems Addressed: Kidney stones: acute illness or injury Post-op pain: acute illness or injury  Amount and/or Complexity of Data Reviewed External Data Reviewed: notes.    Details: Outpatient records from yesterday reviewed and patient had a lithotripsy for kidney stone Labs: ordered. Decision-making details documented in ED Course.    Details: Ordered and reviewed by me and unremarkable Radiology: ordered and independent interpretation performed. Decision-making details documented in ED Course.    Details: Ordered and reviewed by me CT abdomen pelvis shows evidence of multiple small stones but no acute abnormality  Risk OTC drugs. Prescription drug management. Parenteral controlled substances. Drug therapy requiring intensive monitoring for toxicity.     Final diagnoses:  Post-op pain  Kidney stones    ED Discharge Orders     None          Gennaro Duwaine CROME, DO 05/08/24 1619

## 2024-05-08 NOTE — ED Triage Notes (Signed)
 Pt arriving POV with urethral pain. Pt reports having a lithotripsy yesterday and pain has been increasing since last night. Advised by provider to come to ER for evaluation. Denies urinary frequency or difficulty.

## 2024-05-08 NOTE — Discharge Instructions (Signed)
 Follow-up with your urologist as needed.  You can take your oxycodone  as needed for pain.  Throughout the day even if you are feeling well alternate Tylenol  and Motrin  every 3 hours.  You can take up to 1000 mg of Tylenol  3 times a day and 800 mg of ibuprofen  3 times a day.

## 2024-05-15 ENCOUNTER — Encounter: Admitting: Urology

## 2024-05-15 NOTE — Progress Notes (Deleted)
 Assessment: 1. Nephrolithiasis     Plan: KUB from today reviewed with results as noted below.  Chief Complaint:  No chief complaint on file.   History of Present Illness:  Karen Hopkins is a 48 y.o. female who is seen for further evaluation of nephrolithiasis. At her initial visit in June 2024, she had been evaluated for abdominal pain and possible hernia. CT imaging showed a punctate nonobstructing left renal calculus and an 8 mm inferior pole right renal calculus without obstruction. No prior history of kidney stones.  No flank pain.  No dysuria or gross hematuria.  No recent UTIs.  CT imaging from 6/20 showed a 5 mm calculus in the inferior pole of the right kidney.  At her visit in March 2025, she was not having any flank pain or lower urinary tract symptoms.  No dysuria or gross hematuria. KUB showed a 7 mm calcification in the lower right renal shadow.  She was seen on 03/30/24 for evaluation of right sided abdominal and flank pain.  She reported onset of symptoms approximately 1 week prior.  She had pain in the right lower quadrant and in the right flank area.  No lower urinary tract symptoms.  No dysuria or gross hematuria.  No nausea or vomiting. KUB from 03/30/2024 showed a 7-8 mm calcification in the right lower renal shadow. She was evaluated further with a CT renal stone study on 04/07/2024.  This showed a 9 mm calculus in the right renal pelvis without hydronephrosis and a punctate nonobstructing calculus seen in the lower pole of the left kidney. She developed worsening right sided flank and lower quadrant pain on 04/13/2024.  She was seen in the emergency room for her symptoms.  CT imaging at that time showed a 12 mm obstructing calculus at the right UPJ with moderate hydronephrosis. She had some intermittent pain since that time.  She was taking pain medicine with relief.  No fevers or chills.  No nausea or vomiting.  No gross hematuria or dysuria.  KUB from 05/01/24  showed a 8 x 10 mm calcification in the mid right renal shadow consistent with the known right renal calculus.  Treatment options were discussed with the patient and she elected to proceed with shockwave lithotripsy. She underwent right ESWL on 05/07/2024. She developed significant pain postoperatively and was seen in the emergency room on 05/08/2024. CT imaging showed excellent fragmentation of the stone with multiple small stone fragments within the right intrarenal collecting system and a few small stone fragments within the bladder with mild right sided hydronephrosis.   Portions of the above documentation were copied from a prior visit for review purposes only.   Past Medical History:  Past Medical History:  Diagnosis Date   Anxiety    Arthritis    Cancer (HCC) 2011   Rt. Br. Ca   Depression    Diabetes mellitus without complication (HCC)    has been borderline   Essential hypertension 11/05/2015   H. pylori infection    History of radiation therapy    Hypertension    under control with med., has been on med. x 2 yr.   Lymphedema of arm    right; no BP or puncture to right arm   Personal history of chemotherapy 09/16/2009   rt breast   Pneumonia    Seasonal allergies    Sinus headache     Past Surgical History:  Past Surgical History:  Procedure Laterality Date   BREAST BIOPSY Right  08/26/2009   BREAST REDUCTION SURGERY Left 08/23/2013   Procedure: LEFT BREAST REDUCTION  ;  Surgeon: Estefana Reichert, DO;  Location: [facility];  Service: Plastics;  Laterality: Left;   BREAST REDUCTION WITH MASTOPEXY Left 08/22/2019   Procedure: Left breast mastopexy reduction with liposuction for breast symmetry;  Surgeon: Lowery Estefana RAMAN, DO;  Location: [facility];  Service: Plastics;  Laterality: Left;   CESAREAN SECTION  07/15/2001; 03/18/2004; 11/21/2008   EXTRACORPOREAL SHOCK WAVE LITHOTRIPSY Right 05/07/2024   Procedure: LITHOTRIPSY, ESWL;  Surgeon:  Roseann Adine PARAS., MD;  Location: [facility];  Service: Urology;  Laterality: Right;   lateral orbiotomy Left 11/2015   Dr. Melba at [facility]   LATISSIMUS FLAP TO BREAST Right 03/12/2013   Procedure: RIGHT BREAST LATISSIMUS FLAP WITH EXPANDER PLACEMENT;  Surgeon: Estefana Reichert, DO;  Location: [facility];  Service: Plastics;  Laterality: Right;   LIPOSUCTION Bilateral 08/23/2013   Procedure: LIPOSUCTION;  Surgeon: Estefana Reichert, DO;  Location: [facility];  Service: Plastics;  Laterality: Bilateral;   LIPOSUCTION WITH LIPOFILLING Left 08/22/2019   Procedure: LIPOSUCTION WITH LIPOFILLING;  Surgeon: Lowery Estefana RAMAN, DO;  Location: [facility];  Service: Plastics;  Laterality: Left;   MASTECTOMY Right 2011   MODIFIED RADICAL MASTECTOMY Right 03/11/2010   NASAL SEPTUM SURGERY     ORIF ANKLE FRACTURE Left 07/18/2018   Procedure: OPEN REDUCTION INTERNAL FIXATION (ORIF) LEFT ANKLE FRACTURE WITH  SYNDESMOSIS AND ANKLE ARTHROTOMY;  Surgeon: Elsa Lonni SAUNDERS, MD;  Location: [facility];  Service: Orthopedics;  Laterality: Left;   PORT-A-CATH REMOVAL Left 12/01/2010   PORTACATH PLACEMENT Left 09/12/2009   REDUCTION MAMMAPLASTY Left 08/2013   REMOVAL OF TISSUE EXPANDER AND PLACEMENT OF IMPLANT Right 08/23/2013   Procedure: REMOVAL RIGHT TISSUE EXPANDER AND PLACEMENT OF IMPLANT TO RIGHT BREAST ;  Surgeon: Estefana Reichert, DO;  Location: [facility];  Service: Plastics;  Laterality: Right;   SUPRA-UMBILICAL HERNIA  2006   TUBAL LIGATION  11/21/2008   UMBILICAL HERNIA REPAIR  04/12/2022   [facility] DR    Allergies:  Allergies  Allergen Reactions   Penicillins Swelling    FACIAL SWELLING Did it involve swelling of the face/tongue/throat, SOB, or low BP? Yes Did it involve sudden or severe rash/hives, skin peeling, or any reaction on the inside of your mouth or nose? Unknown Did you need to seek medical attention at a hospital or doctor's office? No When  did it last happen? More than 10 years If all above answers are NO, may proceed with cephalosporin use.    Lyrica  [Pregabalin ] Nausea Only    .   Meloxicam  Nausea Only   Robaxin  [Methocarbamol ] Nausea Only    Family History:  Family History  Problem Relation Age of Onset   Hypertension Mother    Breast cancer Mother 49   Diabetes type II Father    Prostate cancer Father 78   Stroke Neg Hx    Colon polyps Neg Hx    Esophageal cancer Neg Hx    Pancreatic cancer Neg Hx    Stomach cancer Neg Hx    Rectal cancer Neg Hx     Social History:  Social History   Tobacco Use   Smoking status: Former    Current packs/day: 0.00    Types: Cigarettes    Start date: 06/07/2002    Quit date: 05/08/2019    Years since quitting: 5.0   Smokeless tobacco: Never  Vaping Use  Vaping status: Never Used  Substance Use Topics   Alcohol  use: No   Drug use: No    ROS: Constitutional:  Negative for fever, chills, weight loss CV: Negative for chest pain, previous MI, hypertension Respiratory:  Negative for shortness of breath, wheezing, sleep apnea, frequent cough GI:  Negative for nausea, vomiting, bloody stool, GERD  Physical exam: There were no vitals taken for this visit. GENERAL APPEARANCE:  Well appearing, well developed, well nourished, NAD HEENT:  Atraumatic, normocephalic, oropharynx clear NECK:  Supple without lymphadenopathy or thyromegaly ABDOMEN:  Soft, non-tender, no masses EXTREMITIES:  Moves all extremities well, without clubbing, cyanosis, or edema NEUROLOGIC:  Alert and oriented x 3, normal gait, CN II-XII grossly intact MENTAL STATUS:  appropriate BACK:  Non-tender to palpation, No CVAT SKIN:  Warm, dry, and intact  Results: U/A:   .

## 2024-05-28 ENCOUNTER — Ambulatory Visit (HOSPITAL_BASED_OUTPATIENT_CLINIC_OR_DEPARTMENT_OTHER)
Admission: RE | Admit: 2024-05-28 | Discharge: 2024-05-28 | Disposition: A | Discharge status: Home / Self Care | Source: Ambulatory Visit | Attending: Urology | Admitting: Urology

## 2024-05-28 ENCOUNTER — Ambulatory Visit (INDEPENDENT_AMBULATORY_CARE_PROVIDER_SITE_OTHER): Admitting: Urology

## 2024-05-28 ENCOUNTER — Encounter: Payer: Self-pay | Admitting: Urology

## 2024-05-28 VITALS — BP 139/92 | HR 95 | Ht 64.0 in | Wt 246.0 lb

## 2024-05-28 DIAGNOSIS — N200 Calculus of kidney: Secondary | ICD-10-CM | POA: Insufficient documentation

## 2024-05-28 LAB — URINALYSIS, ROUTINE W REFLEX MICROSCOPIC
Bilirubin, UA: NEGATIVE
Glucose, UA: NEGATIVE
Ketones, UA: NEGATIVE
Leukocytes,UA: NEGATIVE
Nitrite, UA: NEGATIVE
Protein,UA: NEGATIVE
RBC, UA: NEGATIVE
Specific Gravity, UA: 1.02 (ref 1.005–1.030)
Urobilinogen, Ur: 0.2 mg/dL (ref 0.2–1.0)
pH, UA: 7 (ref 5.0–7.5)

## 2024-05-28 NOTE — Addendum Note (Signed)
 Addended by: OBADIAH ROSELEE RAMAN on: 05/28/2024 11:15 AM   Modules accepted: Orders

## 2024-05-28 NOTE — Progress Notes (Signed)
 "  Assessment: 1. Nephrolithiasis     Plan: KUB from today reviewed with results as noted below. Stone sent for analysis. Will contact her with results. Return to office in 1 month Will arrange for renal U/S after next visit  Chief Complaint:  Chief Complaint  Patient presents with   Nephrolithiasis    History of Present Illness:  Karen Hopkins is a 48 y.o. female who is seen for further evaluation of nephrolithiasis. At her initial visit in June 2024, she had been evaluated for abdominal pain and possible hernia. CT imaging showed a punctate nonobstructing left renal calculus and an 8 mm inferior pole right renal calculus without obstruction. No prior history of kidney stones.  No flank pain.  No dysuria or gross hematuria.  No recent UTIs.  CT imaging from 6/20 showed a 5 mm calculus in the inferior pole of the right kidney.  At her visit in March 2025, she was not having any flank pain or lower urinary tract symptoms.  No dysuria or gross hematuria. KUB showed a 7 mm calcification in the lower right renal shadow.  She was seen on 03/30/24 for evaluation of right sided abdominal and flank pain.  She reported onset of symptoms approximately 1 week prior.  She had pain in the right lower quadrant and in the right flank area.  No lower urinary tract symptoms.  No dysuria or gross hematuria.  No nausea or vomiting. KUB from 03/30/2024 showed a 7-8 mm calcification in the right lower renal shadow. She was evaluated further with a CT renal stone study on 04/07/2024.  This showed a 9 mm calculus in the right renal pelvis without hydronephrosis and a punctate nonobstructing calculus seen in the lower pole of the left kidney. She developed worsening right sided flank and lower quadrant pain on 04/13/2024.  She was seen in the emergency room for her symptoms.  CT imaging at that time showed a 12 mm obstructing calculus at the right UPJ with moderate hydronephrosis. She had some intermittent  pain since that time.  She was taking pain medicine with relief.  No fevers or chills.  No nausea or vomiting.  No gross hematuria or dysuria.  KUB from 05/01/24 showed a 8 x 10 mm calcification in the mid right renal shadow consistent with the known right renal calculus.  Treatment options were discussed with the patient and she elected to proceed with shockwave lithotripsy. She underwent right ESWL on 05/07/2024. She developed significant pain postoperatively and was seen in the emergency room on 05/08/2024. CT imaging showed excellent fragmentation of the stone with multiple small stone fragments within the right intrarenal collecting system and a few small stone fragments within the bladder with mild right sided hydronephrosis.  She returns today for follow-up.  She has passed a number of stone fragments.  She has passed fragments as recently as several days ago.  She is not having any right sided flank pain.  She does have some mild right lower quadrant discomfort.  No dysuria or gross hematuria.  She is no longer taking pain medicine or Flomax .   Portions of the above documentation were copied from a prior visit for review purposes only.   Past Medical History:  Past Medical History:  Diagnosis Date   Anxiety    Arthritis    Cancer (HCC) 2011   Rt. Br. Ca   Depression    Diabetes mellitus without complication (HCC)    has been borderline   Essential hypertension  11/05/2015   H. pylori infection    History of radiation therapy    Hypertension    under control with med., has been on med. x 2 yr.   Lymphedema of arm    right; no BP or puncture to right arm   Personal history of chemotherapy 09/16/2009   rt breast   Pneumonia    Seasonal allergies    Sinus headache     Past Surgical History:  Past Surgical History:  Procedure Laterality Date   BREAST BIOPSY Right 08/26/2009   BREAST REDUCTION SURGERY Left 08/23/2013   Procedure: LEFT BREAST REDUCTION  ;  Surgeon: Estefana Reichert, DO;  Location: [facility];  Service: Plastics;  Laterality: Left;   BREAST REDUCTION WITH MASTOPEXY Left 08/22/2019   Procedure: Left breast mastopexy reduction with liposuction for breast symmetry;  Surgeon: Lowery Estefana RAMAN, DO;  Location: [facility];  Service: Plastics;  Laterality: Left;   CESAREAN SECTION  07/15/2001; 03/18/2004; 11/21/2008   EXTRACORPOREAL SHOCK WAVE LITHOTRIPSY Right 05/07/2024   Procedure: LITHOTRIPSY, ESWL;  Surgeon: Roseann Adine PARAS., MD;  Location: [facility];  Service: Urology;  Laterality: Right;   lateral orbiotomy Left 11/2015   Dr. Melba at [facility]   LATISSIMUS FLAP TO BREAST Right 03/12/2013   Procedure: RIGHT BREAST LATISSIMUS FLAP WITH EXPANDER PLACEMENT;  Surgeon: Estefana Reichert, DO;  Location: [facility];  Service: Plastics;  Laterality: Right;   LIPOSUCTION Bilateral 08/23/2013   Procedure: LIPOSUCTION;  Surgeon: Estefana Reichert, DO;  Location: [facility];  Service: Plastics;  Laterality: Bilateral;   LIPOSUCTION WITH LIPOFILLING Left 08/22/2019   Procedure: LIPOSUCTION WITH LIPOFILLING;  Surgeon: Lowery Estefana RAMAN, DO;  Location: [facility];  Service: Plastics;  Laterality: Left;   MASTECTOMY Right 2011   MODIFIED RADICAL MASTECTOMY Right 03/11/2010   NASAL SEPTUM SURGERY     ORIF ANKLE FRACTURE Left 07/18/2018   Procedure: OPEN REDUCTION INTERNAL FIXATION (ORIF) LEFT ANKLE FRACTURE WITH  SYNDESMOSIS AND ANKLE ARTHROTOMY;  Surgeon: Elsa Lonni SAUNDERS, MD;  Location: [facility];  Service: Orthopedics;  Laterality: Left;   PORT-A-CATH REMOVAL Left 12/01/2010   PORTACATH PLACEMENT Left 09/12/2009   REDUCTION MAMMAPLASTY Left 08/2013   REMOVAL OF TISSUE EXPANDER AND PLACEMENT OF IMPLANT Right 08/23/2013   Procedure: REMOVAL RIGHT TISSUE EXPANDER AND PLACEMENT OF IMPLANT TO RIGHT BREAST ;  Surgeon: Estefana Reichert, DO;  Location:  SURGERY CENTER;  Service: Plastics;   Laterality: Right;   SUPRA-UMBILICAL HERNIA  2006   TUBAL LIGATION  11/21/2008   UMBILICAL HERNIA REPAIR  04/12/2022   [facility]S DR    Allergies:  Allergies  Allergen Reactions   Penicillins Swelling    FACIAL SWELLING Did it involve swelling of the face/tongue/throat, SOB, or low BP? Yes Did it involve sudden or severe rash/hives, skin peeling, or any reaction on the inside of your mouth or nose? Unknown Did you need to seek medical attention at a hospital or doctor's office? No When did it last happen? More than 10 years If all above answers are NO, may proceed with cephalosporin use.    Lyrica  [Pregabalin ] Nausea Only    .   Meloxicam  Nausea Only   Robaxin  [Methocarbamol ] Nausea Only    Family History:  Family History  Problem Relation Age of Onset   Hypertension Mother    Breast cancer Mother 58   Diabetes type II Father    Prostate cancer Father 70   Stroke Neg Hx  Colon polyps Neg Hx    Esophageal cancer Neg Hx    Pancreatic cancer Neg Hx    Stomach cancer Neg Hx    Rectal cancer Neg Hx     Social History:  Social History   Tobacco Use   Smoking status: Former    Current packs/day: 0.00    Types: Cigarettes    Start date: 06/07/2002    Quit date: 05/08/2019    Years since quitting: 5.0   Smokeless tobacco: Never  Vaping Use   Vaping status: Never Used  Substance Use Topics   Alcohol  use: No   Drug use: No    ROS: Constitutional:  Negative for fever, chills, weight loss CV: Negative for chest pain, previous MI, hypertension Respiratory:  Negative for shortness of breath, wheezing, sleep apnea, frequent cough GI:  Negative for nausea, vomiting, bloody stool, GERD  Physical exam: BP (!) 139/92   Pulse 95   Ht 5' 4 (1.626 m)   Wt 246 lb (111.6 kg)   BMI 42.23 kg/m  GENERAL APPEARANCE:  Well appearing, well developed, well nourished, NAD HEENT:  Atraumatic, normocephalic, oropharynx clear NECK:  Supple without lymphadenopathy or  thyromegaly ABDOMEN:  Soft, non-tender, no masses EXTREMITIES:  Moves all extremities well, without clubbing, cyanosis, or edema NEUROLOGIC:  Alert and oriented x 3, normal gait, CN II-XII grossly intact MENTAL STATUS:  appropriate BACK:  Non-tender to palpation, No CVAT SKIN:  Warm, dry, and intact  Results: U/A: Negative   KUB: Visualization of the right renal shadow obscured by overlying bowel gas; ?  1-2 mm calcification to the right of L5 transverse process; no other obvious calcification seen along the expected course of the right ureter. "

## 2024-06-01 ENCOUNTER — Other Ambulatory Visit (HOSPITAL_BASED_OUTPATIENT_CLINIC_OR_DEPARTMENT_OTHER): Payer: Self-pay

## 2024-06-11 NOTE — Progress Notes (Signed)
 Karen Hopkins                                          MRN: [id number]   06/11/2024   The [facility] Quality Team Specialist reviewed this patient medical record for the purposes of chart review for care gap closure. The following were reviewed: abstraction for care gap closure-glycemic status assessment.    [facility] Quality Team

## 2024-06-12 ENCOUNTER — Ambulatory Visit: Payer: Self-pay | Admitting: Urology

## 2024-06-12 LAB — STONE ANALYSIS
Calcium Oxalate Dihydrate: 20 %
Calcium Oxalate Monohydrate: 75 %
Calcium Phosphate (Hydroxyl): 5 %
Weight Calculi: 124 mg

## 2024-06-15 NOTE — Progress Notes (Unsigned)
 "  Breast and Pelvic Exam Patient name: Karen Hopkins MRN [id number]  Date of birth: [birth year] Chief Complaint:   Gynecologic Exam  History of Present Illness:   Karen Hopkins is a 49 y.o. [phone] African-American female being seen today for breast and pelvic exam.  H/o amenorrhea since IUD placement 05/2019.  H/O cervical stenosis after LEEP that was caused by cervical scarring.  IUD was placed to help prevent stenosis in 05/2019.  CT obtained 05/08/2024 due to pain after lithotripsy showed IUD in correct location.    H/o breast cancer 2011.  Has undergone left breast surgery since I saw her last.  She is not completely satified with appearance but doesn't want any other surgery at this time.  Having yearly mammograms  Followed by Dr Loretha.    No LMP recorded. (Menstrual status: IUD).  Last pap 04/13/2023. Results were: NILM w/ HRHPV negative. H/O abnormal pap: yes Last mammogram: Hx Right Mastectomy. 03/22/2024 Left Breast Diagnostic Mammogram. Results were: normal. Family h/o breast cancer: yes mother Last colonoscopy: Family h/o colorectal cancer: no.  Follow up 7 year.       06/18/2024   12:09 PM 04/30/2024    9:06 AM 03/08/2024   11:54 AM 02/16/2024    3:01 PM 12/02/2023   11:10 AM  Depression screen PHQ 2/9  Decreased Interest 0 0 0 0 1  Down, Depressed, Hopeless 0 1 1 0 0  PHQ - 2 Score 0 1 1 0 1  Altered sleeping 1 1 1 1    Tired, decreased energy 1 1 1 1    Change in appetite 0 0 0 0   Feeling bad or failure about yourself  0 0 0 0   Trouble concentrating 0 0 0 0   Moving slowly or fidgety/restless 0 0 0 0   Suicidal thoughts 0 0 0 0   PHQ-9 Score 2 3 3  2     Difficult doing work/chores Somewhat difficult   Not difficult at all      Data saved with a previous flowsheet row definition    Review of Systems:   Pertinent items are noted in HPI Denies any abnormal bleeding, urinary symptoms, bowel changes, pelvic pain Pertinent History Reviewed:  Reviewed past  medical,surgical, social and family history.  Reviewed problem list, medications and allergies. Physical Assessment:   Vitals:   06/18/24 0851  BP: 127/86  Pulse: (!) 107  SpO2: 100%  Weight: 241 lb 9.6 oz (109.6 kg)  Height: 5' 4 (1.626 m)  Body mass index is 41.47 kg/m.        Physical Examination:   General appearance - well appearing, and in no distress  Mental status - alert, oriented to Ram, place, and time  Psych:  She has a normal mood and affect  Skin - warm and dry, normal color, no suspicious lesions noted  Chest - effort normal, all lung fields clear to auscultation bilaterally  Heart - normal rate and regular rhythm  Neck:  midline trachea, no thyromegaly or nodules  Breasts - breasts appear normal, no suspicious masses, no skin or nipple changes or  axillary nodes, implant on right, well healed breast scars on both breasts  Abdomen - soft, nontender, nondistended, no masses or organomegaly  Pelvic - VULVA: normal appearing vulva with no masses, tenderness or lesions   VAGINA: normal appearing vagina with normal color and discharge, no lesions   CERVIX: normal appearing cervix without discharge or lesions, no CMT  Thin prep pap  is not indicated  UTERUS: uterus is felt to be normal size, shape, consistency and nontender   ADNEXA: No adnexal masses or tenderness noted.  Rectal - normal rectal, good sphincter tone, no masses felt.   Extremities:  No swelling or varicosities noted  Chaperone present for exam  Assessment & Plan:  1. GYN exam for high-risk Medicare patient (Primary) - Pap smear neg with neg HR HPV 2024 - Mammogram 03/2024 - Colonoscopy 2020.  Follow up 7 years.   - lab work done with PCP, Dr. Donah - vaccines reviewed/updated.  Pt aware Tdap is overdue. - taking Vit D 1000 -2000IU recommended  2. History of loop electrical excision procedure (LEEP) - in 2020  3. Dysplasia of cervix, low grade (CIN 1) - normal pap smears since leep  4.  Malignant neoplasm of overlapping sites of right breast in female, estrogen receptor positive (HCC) - followed by Dr. Loretha  5. S/P breast reconstruction, right  6. Prediabetes - on Ozympic  7.  IUD in place - placed 05/2019.  Due for removal 05/2027.   No orders of the defined types were placed in this encounter.   Meds: No orders of the defined types were placed in this encounter.   Follow-up: Return in about 1 year (around 06/18/2025).  Karen GORMAN Pinal, MD 06/21/2024 9:21 PM "

## 2024-06-18 ENCOUNTER — Encounter (HOSPITAL_BASED_OUTPATIENT_CLINIC_OR_DEPARTMENT_OTHER): Payer: Self-pay | Admitting: Obstetrics & Gynecology

## 2024-06-18 ENCOUNTER — Ambulatory Visit (INDEPENDENT_AMBULATORY_CARE_PROVIDER_SITE_OTHER): Payer: 59 | Admitting: Obstetrics & Gynecology

## 2024-06-18 VITALS — BP 127/86 | HR 107 | Ht 64.0 in | Wt 241.6 lb

## 2024-06-18 DIAGNOSIS — Z975 Presence of (intrauterine) contraceptive device: Secondary | ICD-10-CM | POA: Diagnosis not present

## 2024-06-18 DIAGNOSIS — Z170 Estrogen receptor positive status [ER+]: Secondary | ICD-10-CM | POA: Diagnosis not present

## 2024-06-18 DIAGNOSIS — N870 Mild cervical dysplasia: Secondary | ICD-10-CM

## 2024-06-18 DIAGNOSIS — Z1331 Encounter for screening for depression: Secondary | ICD-10-CM

## 2024-06-18 DIAGNOSIS — Z87410 Personal history of cervical dysplasia: Secondary | ICD-10-CM

## 2024-06-18 DIAGNOSIS — R7303 Prediabetes: Secondary | ICD-10-CM

## 2024-06-18 DIAGNOSIS — Z9889 Other specified postprocedural states: Secondary | ICD-10-CM

## 2024-06-18 DIAGNOSIS — Z9189 Other specified personal risk factors, not elsewhere classified: Secondary | ICD-10-CM

## 2024-06-18 DIAGNOSIS — Z853 Personal history of malignant neoplasm of breast: Secondary | ICD-10-CM

## 2024-06-18 DIAGNOSIS — Z01419 Encounter for gynecological examination (general) (routine) without abnormal findings: Secondary | ICD-10-CM | POA: Diagnosis not present

## 2024-08-29 ENCOUNTER — Ambulatory Visit: Admitting: Urology

## 2024-11-30 ENCOUNTER — Ambulatory Visit: Admitting: Hematology and Oncology

## 2025-02-18 ENCOUNTER — Encounter
